# Patient Record
Sex: Female | Born: 1945 | Race: Black or African American | Hispanic: No | Marital: Married | Smoking: Former smoker
Health system: Southern US, Community
[De-identification: ages and names within clinical notes are randomized; demographics above are authoritative.]

## PROBLEM LIST (undated history)

## (undated) DIAGNOSIS — I469 Cardiac arrest, cause unspecified: Secondary | ICD-10-CM

## (undated) DIAGNOSIS — I639 Cerebral infarction, unspecified: Secondary | ICD-10-CM

## (undated) DIAGNOSIS — L299 Pruritus, unspecified: Secondary | ICD-10-CM

## (undated) DIAGNOSIS — G4730 Sleep apnea, unspecified: Secondary | ICD-10-CM

## (undated) DIAGNOSIS — I779 Disorder of arteries and arterioles, unspecified: Secondary | ICD-10-CM

## (undated) DIAGNOSIS — N186 End stage renal disease: Secondary | ICD-10-CM

## (undated) DIAGNOSIS — R911 Solitary pulmonary nodule: Secondary | ICD-10-CM

## (undated) DIAGNOSIS — R05 Cough: Secondary | ICD-10-CM

## (undated) DIAGNOSIS — I4891 Unspecified atrial fibrillation: Secondary | ICD-10-CM

## (undated) DIAGNOSIS — C3491 Malignant neoplasm of unspecified part of right bronchus or lung: Secondary | ICD-10-CM

## (undated) DIAGNOSIS — I2510 Atherosclerotic heart disease of native coronary artery without angina pectoris: Secondary | ICD-10-CM

## (undated) DIAGNOSIS — K2100 Gastro-esophageal reflux disease with esophagitis, without bleeding: Secondary | ICD-10-CM

## (undated) DIAGNOSIS — A048 Other specified bacterial intestinal infections: Secondary | ICD-10-CM

## (undated) DIAGNOSIS — K649 Unspecified hemorrhoids: Secondary | ICD-10-CM

## (undated) DIAGNOSIS — E119 Type 2 diabetes mellitus without complications: Secondary | ICD-10-CM

## (undated) DIAGNOSIS — D509 Iron deficiency anemia, unspecified: Secondary | ICD-10-CM

## (undated) DIAGNOSIS — K5730 Diverticulosis of large intestine without perforation or abscess without bleeding: Secondary | ICD-10-CM

## (undated) DIAGNOSIS — A491 Streptococcal infection, unspecified site: Secondary | ICD-10-CM

## (undated) DIAGNOSIS — I509 Heart failure, unspecified: Secondary | ICD-10-CM

## (undated) DIAGNOSIS — E785 Hyperlipidemia, unspecified: Secondary | ICD-10-CM

## (undated) DIAGNOSIS — Z9889 Other specified postprocedural states: Secondary | ICD-10-CM

## (undated) DIAGNOSIS — Z953 Presence of xenogenic heart valve: Secondary | ICD-10-CM

## (undated) DIAGNOSIS — H9190 Unspecified hearing loss, unspecified ear: Secondary | ICD-10-CM

## (undated) DIAGNOSIS — Z8679 Personal history of other diseases of the circulatory system: Secondary | ICD-10-CM

## (undated) DIAGNOSIS — I10 Essential (primary) hypertension: Secondary | ICD-10-CM

## (undated) DIAGNOSIS — I69920 Aphasia following unspecified cerebrovascular disease: Secondary | ICD-10-CM

## (undated) DIAGNOSIS — Z860100 Personal history of colon polyps, unspecified: Secondary | ICD-10-CM

## (undated) DIAGNOSIS — K219 Gastro-esophageal reflux disease without esophagitis: Secondary | ICD-10-CM

## (undated) DIAGNOSIS — I739 Peripheral vascular disease, unspecified: Secondary | ICD-10-CM

## (undated) DIAGNOSIS — I350 Nonrheumatic aortic (valve) stenosis: Secondary | ICD-10-CM

## (undated) DIAGNOSIS — I313 Pericardial effusion (noninflammatory): Secondary | ICD-10-CM

## (undated) DIAGNOSIS — I219 Acute myocardial infarction, unspecified: Secondary | ICD-10-CM

## (undated) DIAGNOSIS — I3139 Other pericardial effusion (noninflammatory): Secondary | ICD-10-CM

## (undated) HISTORY — DX: Essential (primary) hypertension: I10

## (undated) HISTORY — DX: Cough: R05

## (undated) HISTORY — DX: Iron deficiency anemia, unspecified: D50.9

## (undated) HISTORY — DX: Sleep apnea, unspecified: G47.30

## (undated) HISTORY — DX: Personal history of colonic polyps: Z86.010

## (undated) HISTORY — DX: Other specified bacterial intestinal infections: A04.8

## (undated) HISTORY — PX: TUBAL LIGATION: SHX77

## (undated) HISTORY — DX: Personal history of colon polyps, unspecified: Z86.0100

## (undated) HISTORY — DX: Unspecified hearing loss, unspecified ear: H91.90

## (undated) HISTORY — DX: Atherosclerotic heart disease of native coronary artery without angina pectoris: I25.10

## (undated) HISTORY — PX: CORONARY ANGIOPLASTY WITH STENT PLACEMENT: SHX49

## (undated) HISTORY — DX: Unspecified hemorrhoids: K64.9

## (undated) HISTORY — DX: Cerebral infarction, unspecified: I63.9

## (undated) HISTORY — DX: Diverticulosis of large intestine without perforation or abscess without bleeding: K57.30

## (undated) HISTORY — DX: Gastro-esophageal reflux disease with esophagitis, without bleeding: K21.00

## (undated) HISTORY — PX: TONSILLECTOMY: SUR1361

## (undated) HISTORY — DX: Heart failure, unspecified: I50.9

## (undated) HISTORY — DX: Hyperlipidemia, unspecified: E78.5

## (undated) HISTORY — DX: Gastro-esophageal reflux disease without esophagitis: K21.9

## (undated) HISTORY — PX: INSERTION OF DIALYSIS CATHETER: SHX1324

## (undated) HISTORY — DX: Gastro-esophageal reflux disease with esophagitis: K21.0

## (undated) HISTORY — DX: Acute myocardial infarction, unspecified: I21.9

## (undated) HISTORY — DX: Other pericardial effusion (noninflammatory): I31.39

## (undated) HISTORY — PX: NASAL RECONSTRUCTION WITH SEPTAL REPAIR: SHX5665

## (undated) HISTORY — DX: Solitary pulmonary nodule: R91.1

## (undated) HISTORY — DX: Streptococcal infection, unspecified site: A49.1

## (undated) HISTORY — DX: Pericardial effusion (noninflammatory): I31.3

## (undated) HISTORY — PX: TOTAL ABDOMINAL HYSTERECTOMY: SHX209

## (undated) HISTORY — PX: AV FISTULA REPAIR: SHX563

## (undated) HISTORY — PX: ARTERIOVENOUS GRAFT PLACEMENT: SUR1029

## (undated) HISTORY — DX: Malignant neoplasm of unspecified part of right bronchus or lung: C34.91

## (undated) HISTORY — DX: Aphasia following unspecified cerebrovascular disease: I69.920

## (undated) HISTORY — PX: AV FISTULA PLACEMENT: SHX1204

---
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
## 2001-07-19 DIAGNOSIS — I219 Acute myocardial infarction, unspecified: Secondary | ICD-10-CM

## 2001-07-19 HISTORY — DX: Acute myocardial infarction, unspecified: I21.9

## 2001-09-23 ENCOUNTER — Inpatient Hospital Stay (HOSPITAL_COMMUNITY): Admission: EM | Admit: 2001-09-23 | Discharge: 2001-09-25 | Payer: Self-pay | Admitting: *Deleted

## 2001-10-17 ENCOUNTER — Encounter (HOSPITAL_COMMUNITY): Admission: RE | Admit: 2001-10-17 | Discharge: 2002-01-15 | Payer: Self-pay | Admitting: Cardiology

## 2001-11-21 ENCOUNTER — Encounter: Admission: RE | Admit: 2001-11-21 | Discharge: 2001-11-21 | Payer: Self-pay | Admitting: Internal Medicine

## 2001-11-21 ENCOUNTER — Encounter: Payer: Self-pay | Admitting: Internal Medicine

## 2001-12-21 ENCOUNTER — Ambulatory Visit (HOSPITAL_BASED_OUTPATIENT_CLINIC_OR_DEPARTMENT_OTHER): Admission: RE | Admit: 2001-12-21 | Discharge: 2001-12-21 | Payer: Self-pay | Admitting: Critical Care Medicine

## 2002-04-06 ENCOUNTER — Ambulatory Visit (HOSPITAL_BASED_OUTPATIENT_CLINIC_OR_DEPARTMENT_OTHER): Admission: RE | Admit: 2002-04-06 | Discharge: 2002-04-06 | Payer: Self-pay | Admitting: Critical Care Medicine

## 2002-05-17 ENCOUNTER — Encounter: Admission: RE | Admit: 2002-05-17 | Discharge: 2002-08-15 | Payer: Self-pay | Admitting: *Deleted

## 2002-05-19 DIAGNOSIS — I2510 Atherosclerotic heart disease of native coronary artery without angina pectoris: Secondary | ICD-10-CM

## 2002-05-19 HISTORY — DX: Atherosclerotic heart disease of native coronary artery without angina pectoris: I25.10

## 2002-11-26 ENCOUNTER — Encounter: Payer: Self-pay | Admitting: Internal Medicine

## 2002-11-26 ENCOUNTER — Encounter: Admission: RE | Admit: 2002-11-26 | Discharge: 2002-11-26 | Payer: Self-pay | Admitting: Internal Medicine

## 2003-12-04 ENCOUNTER — Inpatient Hospital Stay (HOSPITAL_COMMUNITY): Admission: AD | Admit: 2003-12-04 | Discharge: 2003-12-06 | Payer: Self-pay | Admitting: Otolaryngology

## 2003-12-27 ENCOUNTER — Encounter: Admission: RE | Admit: 2003-12-27 | Discharge: 2003-12-27 | Payer: Self-pay | Admitting: Internal Medicine

## 2004-06-08 ENCOUNTER — Ambulatory Visit: Payer: Self-pay | Admitting: Internal Medicine

## 2004-07-16 ENCOUNTER — Ambulatory Visit: Payer: Self-pay | Admitting: Internal Medicine

## 2004-07-22 ENCOUNTER — Ambulatory Visit: Payer: Self-pay | Admitting: Cardiology

## 2004-08-20 ENCOUNTER — Ambulatory Visit (HOSPITAL_COMMUNITY): Admission: RE | Admit: 2004-08-20 | Discharge: 2004-08-20 | Payer: Self-pay | Admitting: Otolaryngology

## 2004-08-20 ENCOUNTER — Encounter (INDEPENDENT_AMBULATORY_CARE_PROVIDER_SITE_OTHER): Payer: Self-pay | Admitting: Specialist

## 2004-10-07 ENCOUNTER — Ambulatory Visit: Payer: Self-pay | Admitting: Internal Medicine

## 2004-10-12 ENCOUNTER — Ambulatory Visit: Payer: Self-pay | Admitting: Cardiology

## 2004-10-14 ENCOUNTER — Ambulatory Visit: Payer: Self-pay | Admitting: Internal Medicine

## 2004-11-11 ENCOUNTER — Ambulatory Visit: Payer: Self-pay | Admitting: Internal Medicine

## 2004-11-16 ENCOUNTER — Encounter: Admission: RE | Admit: 2004-11-16 | Discharge: 2005-02-14 | Payer: Self-pay | Admitting: Internal Medicine

## 2005-01-04 ENCOUNTER — Ambulatory Visit: Payer: Self-pay | Admitting: Internal Medicine

## 2005-01-12 ENCOUNTER — Encounter: Admission: RE | Admit: 2005-01-12 | Discharge: 2005-01-12 | Payer: Self-pay | Admitting: Internal Medicine

## 2005-01-26 ENCOUNTER — Ambulatory Visit: Payer: Self-pay | Admitting: Internal Medicine

## 2005-03-11 ENCOUNTER — Encounter: Admission: RE | Admit: 2005-03-11 | Discharge: 2005-06-09 | Payer: Self-pay | Admitting: Internal Medicine

## 2005-04-06 ENCOUNTER — Ambulatory Visit: Payer: Self-pay | Admitting: Internal Medicine

## 2005-06-14 ENCOUNTER — Ambulatory Visit: Payer: Self-pay | Admitting: Cardiology

## 2005-06-18 ENCOUNTER — Ambulatory Visit: Payer: Self-pay | Admitting: Internal Medicine

## 2005-06-21 ENCOUNTER — Ambulatory Visit: Payer: Self-pay | Admitting: Cardiology

## 2005-06-21 ENCOUNTER — Ambulatory Visit: Payer: Self-pay

## 2005-06-28 ENCOUNTER — Ambulatory Visit: Payer: Self-pay | Admitting: Cardiology

## 2005-07-01 ENCOUNTER — Ambulatory Visit: Payer: Self-pay | Admitting: Internal Medicine

## 2005-07-01 DIAGNOSIS — K5730 Diverticulosis of large intestine without perforation or abscess without bleeding: Secondary | ICD-10-CM | POA: Insufficient documentation

## 2005-07-06 ENCOUNTER — Ambulatory Visit: Payer: Self-pay | Admitting: Internal Medicine

## 2005-07-08 ENCOUNTER — Ambulatory Visit: Payer: Self-pay | Admitting: Cardiology

## 2005-07-20 ENCOUNTER — Ambulatory Visit: Payer: Self-pay | Admitting: Cardiology

## 2005-08-16 ENCOUNTER — Ambulatory Visit: Payer: Self-pay | Admitting: Cardiology

## 2005-10-01 ENCOUNTER — Emergency Department (HOSPITAL_COMMUNITY): Admission: EM | Admit: 2005-10-01 | Discharge: 2005-10-01 | Payer: Self-pay | Admitting: Family Medicine

## 2005-10-04 ENCOUNTER — Ambulatory Visit: Payer: Self-pay | Admitting: Internal Medicine

## 2005-12-28 ENCOUNTER — Ambulatory Visit: Payer: Self-pay | Admitting: *Deleted

## 2005-12-28 ENCOUNTER — Ambulatory Visit: Payer: Self-pay | Admitting: Cardiology

## 2006-01-05 ENCOUNTER — Ambulatory Visit: Payer: Self-pay | Admitting: Internal Medicine

## 2006-01-10 ENCOUNTER — Ambulatory Visit: Payer: Self-pay | Admitting: Internal Medicine

## 2006-01-18 ENCOUNTER — Encounter: Admission: RE | Admit: 2006-01-18 | Discharge: 2006-01-18 | Payer: Self-pay | Admitting: Internal Medicine

## 2006-02-07 ENCOUNTER — Ambulatory Visit: Payer: Self-pay | Admitting: Cardiology

## 2006-02-07 ENCOUNTER — Ambulatory Visit: Payer: Self-pay

## 2006-04-19 ENCOUNTER — Ambulatory Visit: Payer: Self-pay | Admitting: Internal Medicine

## 2006-06-13 ENCOUNTER — Ambulatory Visit: Payer: Self-pay | Admitting: Internal Medicine

## 2006-06-13 LAB — CONVERTED CEMR LAB
ALT: 19 units/L (ref 0–40)
AST: 22 units/L (ref 0–37)
Alkaline Phosphatase: 71 units/L (ref 39–117)
BUN: 26 mg/dL — ABNORMAL HIGH (ref 6–23)
Calcium: 9.3 mg/dL (ref 8.4–10.5)
Cholesterol: 219 mg/dL (ref 0–200)
GFR calc non Af Amer: 23 mL/min
Glucose, Bld: 126 mg/dL — ABNORMAL HIGH (ref 70–99)
Hemoglobin: 10.6 g/dL — ABNORMAL LOW (ref 12.0–15.0)
Hgb A1c MFr Bld: 6.4 % — ABNORMAL HIGH (ref 4.6–6.0)
MCHC: 32.3 g/dL (ref 30.0–36.0)
MCV: 79.6 fL (ref 78.0–100.0)
Mucus, UA: NEGATIVE
Potassium: 3.3 meq/L — ABNORMAL LOW (ref 3.5–5.1)
RBC: 4.12 M/uL (ref 3.87–5.11)
Total Protein, Urine: >=300 mg/dL — AB
Total Protein: 7.2 g/dL (ref 6.0–8.3)
VLDL: 24 mg/dL (ref 0–40)
WBC: 6.2 10*3/uL (ref 4.5–10.5)

## 2006-06-22 ENCOUNTER — Ambulatory Visit: Payer: Self-pay | Admitting: Cardiology

## 2006-06-24 ENCOUNTER — Ambulatory Visit: Payer: Self-pay | Admitting: Internal Medicine

## 2006-06-27 ENCOUNTER — Ambulatory Visit: Payer: Self-pay

## 2006-06-27 ENCOUNTER — Encounter: Payer: Self-pay | Admitting: Cardiology

## 2006-08-03 ENCOUNTER — Ambulatory Visit: Payer: Self-pay | Admitting: Internal Medicine

## 2006-09-05 ENCOUNTER — Ambulatory Visit: Payer: Self-pay | Admitting: Internal Medicine

## 2006-10-31 ENCOUNTER — Ambulatory Visit: Payer: Self-pay | Admitting: Internal Medicine

## 2006-10-31 LAB — CONVERTED CEMR LAB
BUN: 58 mg/dL — ABNORMAL HIGH (ref 6–23)
Basophils Relative: 3.1 % — ABNORMAL HIGH (ref 0.0–1.0)
Creatinine,U: 106.2 mg/dL
HDL: 49 mg/dL (ref >39.0)
Hemoglobin: 10.2 g/dL — ABNORMAL LOW (ref 12.0–15.0)
Iron: 69 ug/dL (ref 42–145)
LDL Cholesterol: 68 mg/dL (ref 0–99)
Microalb, Ur: 168.6 mg/dL (ref 0.0–1.9)
Monocytes Absolute: 0.5 10*3/uL (ref 0.2–0.7)
Monocytes Relative: 7 % (ref 3.0–11.0)
Potassium: 4.3 meq/L (ref 3.5–5.1)
RDW: 15.2 % — ABNORMAL HIGH (ref 11.5–14.6)
TSH: 3.21 microintl units/mL (ref 0.35–5.50)
Transferrin: 220.9 mg/dL (ref >=212.0)
VLDL: 28 mg/dL (ref 0–40)

## 2006-11-03 ENCOUNTER — Ambulatory Visit: Payer: Self-pay | Admitting: Internal Medicine

## 2006-12-28 ENCOUNTER — Ambulatory Visit: Payer: Self-pay | Admitting: Internal Medicine

## 2007-01-06 ENCOUNTER — Ambulatory Visit: Payer: Self-pay | Admitting: Internal Medicine

## 2007-01-06 LAB — CONVERTED CEMR LAB
ALT: 14 units/L (ref 0–40)
AST: 18 units/L (ref 0–37)
Albumin: 3.3 g/dL — ABNORMAL LOW (ref 3.5–5.2)
Basophils Relative: 0.2 % (ref 0.0–1.0)
Bilirubin Urine: NEGATIVE
Calcium, Total (PTH): 9.3 mg/dL (ref 8.4–10.5)
Calcium: 9.2 mg/dL (ref 8.4–10.5)
Chloride: 109 meq/L (ref 96–112)
Creatinine, Ser: 3.3 mg/dL — ABNORMAL HIGH (ref 0.4–1.2)
Crystals: NEGATIVE
Eosinophils Relative: 2.7 % (ref 0.0–5.0)
HCT: 26.6 % — ABNORMAL LOW (ref 36.0–46.0)
Hemoglobin: 8.7 g/dL — ABNORMAL LOW (ref 12.0–15.0)
Hgb A1c MFr Bld: 6.4 % — ABNORMAL HIGH (ref 4.6–6.0)
Monocytes Absolute: 0.7 10*3/uL (ref 0.2–0.7)
Neutrophils Relative %: 71.2 % (ref 43.0–77.0)
Nitrite: NEGATIVE
PTH: 672.5 pg/mL — ABNORMAL HIGH (ref 14.0–72.0)
RDW: 15.6 % — ABNORMAL HIGH (ref 11.5–14.6)
Sodium: 138 meq/L (ref 135–145)
Specific Gravity, Urine: 1.025 (ref 1.000–1.03)
TSH: 2.45 microintl units/mL (ref 0.35–5.50)
Total Bilirubin: 0.3 mg/dL (ref 0.3–1.2)
Total Protein, Urine: >=300 mg/dL — AB
Vit D, 1,25-Dihydroxy: 17 — ABNORMAL LOW (ref 20–57)
WBC: 7.3 10*3/uL (ref 4.5–10.5)

## 2007-01-11 ENCOUNTER — Ambulatory Visit: Payer: Self-pay | Admitting: Internal Medicine

## 2007-01-23 ENCOUNTER — Encounter: Admission: RE | Admit: 2007-01-23 | Discharge: 2007-01-23 | Payer: Self-pay | Admitting: Internal Medicine

## 2007-02-03 ENCOUNTER — Ambulatory Visit: Payer: Self-pay | Admitting: Internal Medicine

## 2007-02-03 LAB — CONVERTED CEMR LAB
BUN: 54 mg/dL — ABNORMAL HIGH (ref 6–23)
CO2: 25 meq/L (ref 19–32)
Calcium: 9.5 mg/dL (ref 8.4–10.5)
GFR calc Af Amer: 16 mL/min
GFR calc non Af Amer: 13 mL/min
Potassium: 4.3 meq/L (ref 3.5–5.1)

## 2007-02-16 ENCOUNTER — Encounter (HOSPITAL_COMMUNITY): Admission: RE | Admit: 2007-02-16 | Discharge: 2007-05-17 | Payer: Self-pay | Admitting: Nephrology

## 2007-02-24 ENCOUNTER — Ambulatory Visit: Payer: Self-pay | Admitting: Cardiology

## 2007-04-03 ENCOUNTER — Ambulatory Visit: Payer: Self-pay

## 2007-04-03 ENCOUNTER — Ambulatory Visit: Payer: Self-pay | Admitting: Cardiology

## 2007-04-03 ENCOUNTER — Encounter: Payer: Self-pay | Admitting: Cardiology

## 2007-04-03 LAB — CONVERTED CEMR LAB
ALT: 17 units/L (ref 0–35)
AST: 16 units/L (ref 0–37)
Bilirubin, Direct: 0.1 mg/dL (ref 0.0–0.3)
Cholesterol: 145 mg/dL (ref 0–200)
HDL: 38.5 mg/dL — ABNORMAL LOW (ref >39.0)
Total Bilirubin: 0.5 mg/dL (ref 0.3–1.2)
Total Protein: 6.7 g/dL (ref 6.0–8.3)
Triglycerides: 116 mg/dL (ref 0–149)

## 2007-04-10 ENCOUNTER — Ambulatory Visit: Payer: Self-pay | Admitting: Internal Medicine

## 2007-05-10 ENCOUNTER — Ambulatory Visit: Payer: Self-pay | Admitting: Internal Medicine

## 2007-05-12 ENCOUNTER — Ambulatory Visit (HOSPITAL_COMMUNITY): Admission: RE | Admit: 2007-05-12 | Discharge: 2007-05-12 | Payer: Self-pay | Admitting: Internal Medicine

## 2007-05-23 ENCOUNTER — Encounter: Payer: Self-pay | Admitting: Internal Medicine

## 2007-05-23 DIAGNOSIS — M109 Gout, unspecified: Secondary | ICD-10-CM | POA: Insufficient documentation

## 2007-05-23 DIAGNOSIS — Z940 Kidney transplant status: Secondary | ICD-10-CM

## 2007-05-23 DIAGNOSIS — E1122 Type 2 diabetes mellitus with diabetic chronic kidney disease: Secondary | ICD-10-CM | POA: Insufficient documentation

## 2007-05-23 DIAGNOSIS — Z860100 Personal history of colon polyps, unspecified: Secondary | ICD-10-CM | POA: Insufficient documentation

## 2007-05-23 DIAGNOSIS — E1129 Type 2 diabetes mellitus with other diabetic kidney complication: Secondary | ICD-10-CM

## 2007-05-23 DIAGNOSIS — I151 Hypertension secondary to other renal disorders: Secondary | ICD-10-CM

## 2007-05-23 DIAGNOSIS — K219 Gastro-esophageal reflux disease without esophagitis: Secondary | ICD-10-CM | POA: Insufficient documentation

## 2007-05-23 DIAGNOSIS — I509 Heart failure, unspecified: Secondary | ICD-10-CM | POA: Insufficient documentation

## 2007-05-23 DIAGNOSIS — I252 Old myocardial infarction: Secondary | ICD-10-CM

## 2007-05-23 DIAGNOSIS — I2510 Atherosclerotic heart disease of native coronary artery without angina pectoris: Secondary | ICD-10-CM

## 2007-06-06 ENCOUNTER — Ambulatory Visit: Payer: Self-pay | Admitting: Cardiology

## 2007-06-06 ENCOUNTER — Telehealth (INDEPENDENT_AMBULATORY_CARE_PROVIDER_SITE_OTHER): Payer: Self-pay | Admitting: *Deleted

## 2007-06-06 LAB — CONVERTED CEMR LAB
ALT: 24 units/L (ref 0–35)
Albumin: 3.5 g/dL (ref 3.5–5.2)
Alkaline Phosphatase: 55 units/L (ref 39–117)
Cholesterol: 142 mg/dL (ref 0–200)
Total Bilirubin: 0.6 mg/dL (ref 0.3–1.2)
Total CHOL/HDL Ratio: 2.8
Total Protein: 6.7 g/dL (ref 6.0–8.3)

## 2007-06-08 ENCOUNTER — Ambulatory Visit: Payer: Self-pay | Admitting: Internal Medicine

## 2007-06-08 DIAGNOSIS — Z8711 Personal history of peptic ulcer disease: Secondary | ICD-10-CM

## 2007-06-08 DIAGNOSIS — K210 Gastro-esophageal reflux disease with esophagitis: Secondary | ICD-10-CM

## 2007-06-08 DIAGNOSIS — K2980 Duodenitis without bleeding: Secondary | ICD-10-CM | POA: Insufficient documentation

## 2007-06-09 ENCOUNTER — Ambulatory Visit: Payer: Self-pay | Admitting: Internal Medicine

## 2007-06-09 DIAGNOSIS — K8020 Calculus of gallbladder without cholecystitis without obstruction: Secondary | ICD-10-CM | POA: Insufficient documentation

## 2007-06-12 ENCOUNTER — Encounter (HOSPITAL_COMMUNITY): Admission: RE | Admit: 2007-06-12 | Discharge: 2007-09-10 | Payer: Self-pay | Admitting: Nephrology

## 2007-06-12 LAB — CONVERTED CEMR LAB: Rh Type: POSITIVE

## 2007-06-29 ENCOUNTER — Encounter: Payer: Self-pay | Admitting: Internal Medicine

## 2007-07-05 ENCOUNTER — Ambulatory Visit: Payer: Self-pay | Admitting: Internal Medicine

## 2007-07-06 ENCOUNTER — Encounter (INDEPENDENT_AMBULATORY_CARE_PROVIDER_SITE_OTHER): Payer: Self-pay | Admitting: *Deleted

## 2007-07-20 DIAGNOSIS — I639 Cerebral infarction, unspecified: Secondary | ICD-10-CM

## 2007-07-20 HISTORY — DX: Cerebral infarction, unspecified: I63.9

## 2007-07-20 HISTORY — PX: CHOLECYSTECTOMY: SHX55

## 2007-08-16 ENCOUNTER — Ambulatory Visit: Payer: Self-pay | Admitting: Cardiology

## 2007-08-29 ENCOUNTER — Ambulatory Visit: Payer: Self-pay

## 2007-08-29 ENCOUNTER — Encounter: Payer: Self-pay | Admitting: Internal Medicine

## 2007-09-04 ENCOUNTER — Encounter (INDEPENDENT_AMBULATORY_CARE_PROVIDER_SITE_OTHER): Payer: Self-pay | Admitting: General Surgery

## 2007-09-04 ENCOUNTER — Ambulatory Visit (HOSPITAL_COMMUNITY): Admission: RE | Admit: 2007-09-04 | Discharge: 2007-09-05 | Payer: Self-pay | Admitting: General Surgery

## 2007-09-11 ENCOUNTER — Encounter (HOSPITAL_COMMUNITY): Admission: RE | Admit: 2007-09-11 | Discharge: 2007-12-10 | Payer: Self-pay | Admitting: Nephrology

## 2007-09-13 ENCOUNTER — Encounter: Payer: Self-pay | Admitting: Internal Medicine

## 2007-09-21 ENCOUNTER — Encounter: Payer: Self-pay | Admitting: Internal Medicine

## 2007-09-27 DIAGNOSIS — E785 Hyperlipidemia, unspecified: Secondary | ICD-10-CM

## 2007-09-27 DIAGNOSIS — K5900 Constipation, unspecified: Secondary | ICD-10-CM

## 2007-09-27 DIAGNOSIS — G4730 Sleep apnea, unspecified: Secondary | ICD-10-CM | POA: Insufficient documentation

## 2007-09-27 DIAGNOSIS — I6350 Cerebral infarction due to unspecified occlusion or stenosis of unspecified cerebral artery: Secondary | ICD-10-CM | POA: Insufficient documentation

## 2007-09-27 DIAGNOSIS — I319 Disease of pericardium, unspecified: Secondary | ICD-10-CM | POA: Insufficient documentation

## 2007-10-18 ENCOUNTER — Encounter: Payer: Self-pay | Admitting: Internal Medicine

## 2007-11-09 ENCOUNTER — Ambulatory Visit: Payer: Self-pay | Admitting: Internal Medicine

## 2007-11-10 LAB — CONVERTED CEMR LAB
Albumin: 3.7 g/dL (ref 3.5–5.2)
BUN: 70 mg/dL — ABNORMAL HIGH (ref 6–23)
Bilirubin, Direct: 0.1 mg/dL (ref 0.0–0.3)
Calcium: 10.6 mg/dL — ABNORMAL HIGH (ref 8.4–10.5)
GFR calc Af Amer: 13 mL/min
GFR calc non Af Amer: 11 mL/min
Glucose, Bld: 126 mg/dL — ABNORMAL HIGH (ref 70–99)
HDL: 48.5 mg/dL (ref >39.0)
LDL Cholesterol: 82 mg/dL (ref 0–99)
Potassium: 3.9 meq/L (ref 3.5–5.1)
Sodium: 141 meq/L (ref 135–145)
Total Protein: 7.1 g/dL (ref 6.0–8.3)
VLDL: 20 mg/dL (ref 0–40)

## 2007-11-13 ENCOUNTER — Ambulatory Visit: Payer: Self-pay | Admitting: Internal Medicine

## 2007-11-17 ENCOUNTER — Telehealth: Payer: Self-pay | Admitting: Internal Medicine

## 2007-12-27 ENCOUNTER — Encounter: Payer: Self-pay | Admitting: Internal Medicine

## 2008-01-01 ENCOUNTER — Encounter (HOSPITAL_COMMUNITY): Admission: RE | Admit: 2008-01-01 | Discharge: 2008-03-31 | Payer: Self-pay | Admitting: Nephrology

## 2008-02-07 ENCOUNTER — Ambulatory Visit: Payer: Self-pay | Admitting: Internal Medicine

## 2008-02-09 ENCOUNTER — Ambulatory Visit: Payer: Self-pay

## 2008-02-09 LAB — CONVERTED CEMR LAB
ALT: 22 units/L (ref 0–35)
AST: 23 units/L (ref 0–37)
Albumin: 3.6 g/dL (ref 3.5–5.2)
BUN: 48 mg/dL — ABNORMAL HIGH (ref 6–23)
Basophils Relative: 0.3 % (ref 0.0–3.0)
CO2: 26 meq/L (ref 19–32)
Chloride: 109 meq/L (ref 96–112)
Creatinine, Ser: 4.1 mg/dL — ABNORMAL HIGH (ref 0.4–1.2)
Eosinophils Relative: 3.6 % (ref 0.0–5.0)
Glucose, Bld: 147 mg/dL — ABNORMAL HIGH (ref 70–99)
Hemoglobin: 10.6 g/dL — ABNORMAL LOW (ref 12.0–15.0)
Hgb A1c MFr Bld: 6.2 % — ABNORMAL HIGH (ref 4.6–6.0)
Lymphocytes Relative: 14.9 % (ref 12.0–46.0)
Monocytes Relative: 8.5 % (ref 3.0–12.0)
Neutro Abs: 5.2 10*3/uL (ref 1.4–7.7)
Neutrophils Relative %: 72.7 % (ref 43.0–77.0)
RBC: 3.87 M/uL (ref 3.87–5.11)
Total Bilirubin: 0.5 mg/dL (ref 0.3–1.2)
Total Protein: 7 g/dL (ref 6.0–8.3)
WBC: 7.2 10*3/uL (ref 4.5–10.5)

## 2008-02-13 ENCOUNTER — Ambulatory Visit: Payer: Self-pay | Admitting: Internal Medicine

## 2008-02-19 ENCOUNTER — Encounter: Payer: Self-pay | Admitting: Internal Medicine

## 2008-02-19 ENCOUNTER — Ambulatory Visit: Payer: Self-pay | Admitting: Surgery

## 2008-02-27 ENCOUNTER — Encounter: Admission: RE | Admit: 2008-02-27 | Discharge: 2008-02-27 | Payer: Self-pay | Admitting: Internal Medicine

## 2008-02-28 ENCOUNTER — Ambulatory Visit (HOSPITAL_COMMUNITY): Admission: RE | Admit: 2008-02-28 | Discharge: 2008-02-28 | Payer: Self-pay | Admitting: Surgery

## 2008-02-28 ENCOUNTER — Ambulatory Visit: Payer: Self-pay | Admitting: Surgery

## 2008-03-18 ENCOUNTER — Ambulatory Visit: Payer: Self-pay | Admitting: Surgery

## 2008-03-19 ENCOUNTER — Encounter: Payer: Self-pay | Admitting: Internal Medicine

## 2008-03-20 ENCOUNTER — Ambulatory Visit (HOSPITAL_COMMUNITY): Admission: RE | Admit: 2008-03-20 | Discharge: 2008-03-20 | Payer: Self-pay | Admitting: Surgery

## 2008-03-29 ENCOUNTER — Ambulatory Visit (HOSPITAL_COMMUNITY): Admission: RE | Admit: 2008-03-29 | Discharge: 2008-03-29 | Payer: Self-pay | Admitting: Surgery

## 2008-03-29 ENCOUNTER — Ambulatory Visit: Payer: Self-pay | Admitting: Surgery

## 2008-04-29 ENCOUNTER — Ambulatory Visit: Payer: Self-pay | Admitting: Cardiology

## 2008-05-04 ENCOUNTER — Inpatient Hospital Stay (HOSPITAL_COMMUNITY): Admission: EM | Admit: 2008-05-04 | Discharge: 2008-05-09 | Payer: Self-pay | Admitting: Emergency Medicine

## 2008-05-04 ENCOUNTER — Ambulatory Visit: Payer: Self-pay | Admitting: Internal Medicine

## 2008-05-06 ENCOUNTER — Encounter: Payer: Self-pay | Admitting: Internal Medicine

## 2008-05-07 ENCOUNTER — Ambulatory Visit: Payer: Self-pay | Admitting: Vascular Surgery

## 2008-05-21 ENCOUNTER — Ambulatory Visit: Payer: Self-pay | Admitting: Internal Medicine

## 2008-05-21 ENCOUNTER — Ambulatory Visit: Payer: Self-pay | Admitting: Vascular Surgery

## 2008-05-21 DIAGNOSIS — R5382 Chronic fatigue, unspecified: Secondary | ICD-10-CM | POA: Insufficient documentation

## 2008-06-13 ENCOUNTER — Ambulatory Visit: Payer: Self-pay | Admitting: Cardiovascular Disease

## 2008-06-14 ENCOUNTER — Ambulatory Visit: Payer: Self-pay

## 2008-06-14 ENCOUNTER — Encounter: Payer: Self-pay | Admitting: Cardiology

## 2008-06-17 ENCOUNTER — Telehealth: Payer: Self-pay | Admitting: Internal Medicine

## 2008-06-19 ENCOUNTER — Ambulatory Visit: Payer: Self-pay | Admitting: Internal Medicine

## 2008-06-19 ENCOUNTER — Ambulatory Visit: Payer: Self-pay | Admitting: Cardiology

## 2008-06-19 ENCOUNTER — Inpatient Hospital Stay (HOSPITAL_COMMUNITY): Admission: EM | Admit: 2008-06-19 | Discharge: 2008-06-28 | Payer: Self-pay | Admitting: Emergency Medicine

## 2008-06-20 ENCOUNTER — Encounter (INDEPENDENT_AMBULATORY_CARE_PROVIDER_SITE_OTHER): Payer: Self-pay | Admitting: Internal Medicine

## 2008-06-21 ENCOUNTER — Encounter: Payer: Self-pay | Admitting: Internal Medicine

## 2008-06-26 ENCOUNTER — Ambulatory Visit: Payer: Self-pay | Admitting: Infectious Diseases

## 2008-06-28 ENCOUNTER — Encounter: Payer: Self-pay | Admitting: Internal Medicine

## 2008-06-28 ENCOUNTER — Telehealth: Payer: Self-pay | Admitting: Internal Medicine

## 2008-07-02 ENCOUNTER — Ambulatory Visit: Payer: Self-pay | Admitting: Internal Medicine

## 2008-07-02 DIAGNOSIS — I69920 Aphasia following unspecified cerebrovascular disease: Secondary | ICD-10-CM | POA: Insufficient documentation

## 2008-07-02 DIAGNOSIS — C649 Malignant neoplasm of unspecified kidney, except renal pelvis: Secondary | ICD-10-CM | POA: Insufficient documentation

## 2008-07-09 ENCOUNTER — Encounter: Payer: Self-pay | Admitting: Internal Medicine

## 2008-07-18 ENCOUNTER — Encounter: Admission: RE | Admit: 2008-07-18 | Discharge: 2008-09-05 | Payer: Self-pay | Admitting: Internal Medicine

## 2008-07-19 HISTORY — PX: NEPHRECTOMY: SHX65

## 2008-07-25 ENCOUNTER — Encounter: Payer: Self-pay | Admitting: Internal Medicine

## 2008-08-01 ENCOUNTER — Ambulatory Visit: Payer: Self-pay | Admitting: *Deleted

## 2008-08-27 ENCOUNTER — Ambulatory Visit: Payer: Self-pay | Admitting: Internal Medicine

## 2008-08-27 DIAGNOSIS — R05 Cough: Secondary | ICD-10-CM

## 2008-08-27 DIAGNOSIS — R109 Unspecified abdominal pain: Secondary | ICD-10-CM | POA: Insufficient documentation

## 2008-08-29 ENCOUNTER — Ambulatory Visit: Payer: Self-pay | Admitting: *Deleted

## 2008-09-10 ENCOUNTER — Ambulatory Visit: Payer: Self-pay | Admitting: Cardiovascular Disease

## 2008-09-17 ENCOUNTER — Ambulatory Visit: Payer: Self-pay | Admitting: *Deleted

## 2008-09-17 ENCOUNTER — Ambulatory Visit (HOSPITAL_COMMUNITY): Admission: RE | Admit: 2008-09-17 | Discharge: 2008-09-17 | Payer: Self-pay | Admitting: *Deleted

## 2008-10-03 ENCOUNTER — Ambulatory Visit: Payer: Self-pay | Admitting: Internal Medicine

## 2008-10-29 ENCOUNTER — Ambulatory Visit: Payer: Self-pay | Admitting: Internal Medicine

## 2008-10-29 DIAGNOSIS — R238 Other skin changes: Secondary | ICD-10-CM | POA: Insufficient documentation

## 2008-11-13 ENCOUNTER — Encounter: Payer: Self-pay | Admitting: Internal Medicine

## 2008-11-14 ENCOUNTER — Ambulatory Visit (HOSPITAL_COMMUNITY): Admission: RE | Admit: 2008-11-14 | Discharge: 2008-11-14 | Payer: Self-pay | Admitting: Vascular Surgery

## 2008-11-14 ENCOUNTER — Ambulatory Visit: Payer: Self-pay | Admitting: Surgery

## 2008-11-15 ENCOUNTER — Ambulatory Visit: Payer: Self-pay | Admitting: Endocrinology

## 2008-11-20 ENCOUNTER — Encounter: Payer: Self-pay | Admitting: Internal Medicine

## 2008-11-21 ENCOUNTER — Ambulatory Visit (HOSPITAL_COMMUNITY): Admission: RE | Admit: 2008-11-21 | Discharge: 2008-11-21 | Payer: Self-pay | Admitting: Nephrology

## 2008-12-02 ENCOUNTER — Encounter (INDEPENDENT_AMBULATORY_CARE_PROVIDER_SITE_OTHER): Payer: Self-pay | Admitting: *Deleted

## 2008-12-19 ENCOUNTER — Ambulatory Visit: Payer: Self-pay | Admitting: Internal Medicine

## 2008-12-30 ENCOUNTER — Telehealth: Payer: Self-pay | Admitting: Internal Medicine

## 2008-12-31 DIAGNOSIS — I480 Paroxysmal atrial fibrillation: Secondary | ICD-10-CM | POA: Insufficient documentation

## 2008-12-31 DIAGNOSIS — I4891 Unspecified atrial fibrillation: Secondary | ICD-10-CM

## 2008-12-31 DIAGNOSIS — D649 Anemia, unspecified: Secondary | ICD-10-CM

## 2008-12-31 DIAGNOSIS — B952 Enterococcus as the cause of diseases classified elsewhere: Secondary | ICD-10-CM

## 2008-12-31 HISTORY — DX: Unspecified atrial fibrillation: I48.91

## 2009-01-02 ENCOUNTER — Encounter: Payer: Self-pay | Admitting: Nurse Practitioner

## 2009-01-02 ENCOUNTER — Ambulatory Visit: Payer: Self-pay | Admitting: Internal Medicine

## 2009-01-14 ENCOUNTER — Telehealth: Payer: Self-pay | Admitting: Internal Medicine

## 2009-01-14 ENCOUNTER — Ambulatory Visit: Payer: Self-pay | Admitting: Internal Medicine

## 2009-01-23 ENCOUNTER — Encounter: Payer: Self-pay | Admitting: Internal Medicine

## 2009-01-24 ENCOUNTER — Telehealth: Payer: Self-pay | Admitting: Internal Medicine

## 2009-03-04 ENCOUNTER — Ambulatory Visit: Payer: Self-pay | Admitting: Cardiology

## 2009-03-04 DIAGNOSIS — I6529 Occlusion and stenosis of unspecified carotid artery: Secondary | ICD-10-CM

## 2009-03-04 DIAGNOSIS — L299 Pruritus, unspecified: Secondary | ICD-10-CM | POA: Insufficient documentation

## 2009-03-11 ENCOUNTER — Encounter: Admission: RE | Admit: 2009-03-11 | Discharge: 2009-03-11 | Payer: Self-pay | Admitting: Internal Medicine

## 2009-03-18 ENCOUNTER — Encounter: Payer: Self-pay | Admitting: Internal Medicine

## 2009-03-20 ENCOUNTER — Encounter: Admission: RE | Admit: 2009-03-20 | Discharge: 2009-03-20 | Payer: Self-pay | Admitting: Internal Medicine

## 2009-03-25 ENCOUNTER — Ambulatory Visit: Payer: Self-pay | Admitting: Internal Medicine

## 2009-04-29 ENCOUNTER — Encounter: Payer: Self-pay | Admitting: Internal Medicine

## 2009-05-13 ENCOUNTER — Encounter: Payer: Self-pay | Admitting: Internal Medicine

## 2009-05-15 ENCOUNTER — Encounter: Payer: Self-pay | Admitting: Internal Medicine

## 2009-05-27 ENCOUNTER — Encounter: Payer: Self-pay | Admitting: Internal Medicine

## 2009-06-03 ENCOUNTER — Ambulatory Visit (HOSPITAL_COMMUNITY): Admission: RE | Admit: 2009-06-03 | Discharge: 2009-06-03 | Payer: Self-pay | Admitting: Nephrology

## 2009-06-05 ENCOUNTER — Encounter (INDEPENDENT_AMBULATORY_CARE_PROVIDER_SITE_OTHER): Payer: Self-pay | Admitting: *Deleted

## 2009-06-19 ENCOUNTER — Ambulatory Visit: Payer: Self-pay | Admitting: Internal Medicine

## 2009-06-24 ENCOUNTER — Ambulatory Visit: Payer: Self-pay | Admitting: Internal Medicine

## 2009-06-24 LAB — CONVERTED CEMR LAB
Bilirubin, Direct: 0 mg/dL (ref 0.0–0.3)
CO2: 31 meq/L (ref 19–32)
Calcium: 10.1 mg/dL (ref 8.4–10.5)
Creatinine, Ser: 5.8 mg/dL (ref 0.4–1.2)
GFR calc non Af Amer: 9.4 mL/min (ref >60)
HDL: 61.2 mg/dL (ref >39.00)
Total CHOL/HDL Ratio: 2
Total Protein: 7.6 g/dL (ref 6.0–8.3)
Triglycerides: 85 mg/dL (ref 0.0–149.0)
VLDL: 17 mg/dL (ref 0.0–40.0)

## 2009-06-26 ENCOUNTER — Encounter: Payer: Self-pay | Admitting: Internal Medicine

## 2009-07-15 ENCOUNTER — Encounter: Payer: Self-pay | Admitting: Internal Medicine

## 2009-08-28 ENCOUNTER — Encounter: Admission: RE | Admit: 2009-08-28 | Discharge: 2009-08-28 | Payer: Self-pay | Admitting: Nephrology

## 2009-09-02 ENCOUNTER — Encounter: Payer: Self-pay | Admitting: Internal Medicine

## 2009-09-18 ENCOUNTER — Encounter: Admission: RE | Admit: 2009-09-18 | Discharge: 2009-09-18 | Payer: Self-pay | Admitting: Internal Medicine

## 2009-10-02 ENCOUNTER — Ambulatory Visit: Payer: Self-pay | Admitting: Internal Medicine

## 2009-10-02 LAB — CONVERTED CEMR LAB
Alkaline Phosphatase: 72 units/L (ref 39–117)
BUN: 32 mg/dL — ABNORMAL HIGH (ref 6–23)
Basophils Absolute: 0 10*3/uL (ref 0.0–0.1)
Bilirubin, Direct: 0.1 mg/dL (ref 0.0–0.3)
CO2: 31 meq/L (ref 19–32)
Chloride: 105 meq/L (ref 96–112)
Cholesterol: 120 mg/dL (ref 0–200)
Creatinine, Ser: 5.1 mg/dL (ref 0.4–1.2)
Eosinophils Absolute: 0.1 10*3/uL (ref 0.0–0.7)
HCT: 33.7 % — ABNORMAL LOW (ref 36.0–46.0)
Hemoglobin: 10.8 g/dL — ABNORMAL LOW (ref 12.0–15.0)
LDL Cholesterol: 32 mg/dL (ref 0–99)
Lymphs Abs: 1.5 10*3/uL (ref 0.7–4.0)
MCHC: 32.1 g/dL (ref 30.0–36.0)
MCV: 93.3 fL (ref 78.0–100.0)
Neutro Abs: 3.6 10*3/uL (ref 1.4–7.7)
RDW: 20.4 % — ABNORMAL HIGH (ref 11.5–14.6)
Total CHOL/HDL Ratio: 2

## 2009-10-07 ENCOUNTER — Ambulatory Visit: Payer: Self-pay | Admitting: Internal Medicine

## 2009-10-07 DIAGNOSIS — F17200 Nicotine dependence, unspecified, uncomplicated: Secondary | ICD-10-CM | POA: Insufficient documentation

## 2009-10-07 DIAGNOSIS — L659 Nonscarring hair loss, unspecified: Secondary | ICD-10-CM | POA: Insufficient documentation

## 2009-10-07 DIAGNOSIS — R21 Rash and other nonspecific skin eruption: Secondary | ICD-10-CM | POA: Insufficient documentation

## 2009-10-14 ENCOUNTER — Telehealth (INDEPENDENT_AMBULATORY_CARE_PROVIDER_SITE_OTHER): Payer: Self-pay | Admitting: *Deleted

## 2009-10-14 ENCOUNTER — Ambulatory Visit: Payer: Self-pay | Admitting: Cardiology

## 2009-10-21 ENCOUNTER — Encounter: Payer: Self-pay | Admitting: Internal Medicine

## 2009-11-04 ENCOUNTER — Telehealth (INDEPENDENT_AMBULATORY_CARE_PROVIDER_SITE_OTHER): Payer: Self-pay | Admitting: *Deleted

## 2009-11-11 ENCOUNTER — Telehealth: Payer: Self-pay | Admitting: Internal Medicine

## 2009-11-20 ENCOUNTER — Encounter: Payer: Self-pay | Admitting: Internal Medicine

## 2009-11-25 ENCOUNTER — Encounter: Payer: Self-pay | Admitting: Internal Medicine

## 2009-11-27 ENCOUNTER — Encounter: Payer: Self-pay | Admitting: Internal Medicine

## 2009-11-27 ENCOUNTER — Encounter: Payer: Self-pay | Admitting: Physician Assistant

## 2009-12-01 ENCOUNTER — Telehealth: Payer: Self-pay | Admitting: Internal Medicine

## 2009-12-03 ENCOUNTER — Encounter: Payer: Self-pay | Admitting: Internal Medicine

## 2009-12-09 ENCOUNTER — Ambulatory Visit: Payer: Self-pay | Admitting: Internal Medicine

## 2009-12-09 DIAGNOSIS — N186 End stage renal disease: Secondary | ICD-10-CM | POA: Insufficient documentation

## 2009-12-30 ENCOUNTER — Telehealth: Payer: Self-pay | Admitting: Internal Medicine

## 2010-01-29 ENCOUNTER — Encounter: Payer: Self-pay | Admitting: Internal Medicine

## 2010-02-03 ENCOUNTER — Telehealth: Payer: Self-pay | Admitting: Internal Medicine

## 2010-02-16 ENCOUNTER — Encounter: Payer: Self-pay | Admitting: Cardiology

## 2010-02-17 ENCOUNTER — Encounter: Payer: Self-pay | Admitting: Cardiovascular Disease

## 2010-02-17 ENCOUNTER — Ambulatory Visit: Payer: Self-pay

## 2010-02-17 ENCOUNTER — Encounter: Payer: Self-pay | Admitting: Cardiology

## 2010-02-24 ENCOUNTER — Encounter: Payer: Self-pay | Admitting: Physician Assistant

## 2010-03-10 ENCOUNTER — Ambulatory Visit: Payer: Self-pay | Admitting: Internal Medicine

## 2010-03-10 DIAGNOSIS — R1314 Dysphagia, pharyngoesophageal phase: Secondary | ICD-10-CM | POA: Insufficient documentation

## 2010-03-12 ENCOUNTER — Encounter: Admission: RE | Admit: 2010-03-12 | Discharge: 2010-03-12 | Payer: Self-pay | Admitting: Otolaryngology

## 2010-03-24 ENCOUNTER — Encounter: Admission: RE | Admit: 2010-03-24 | Discharge: 2010-03-24 | Payer: Self-pay | Admitting: Internal Medicine

## 2010-03-24 LAB — HM MAMMOGRAPHY: HM Mammogram: NEGATIVE

## 2010-04-09 ENCOUNTER — Telehealth: Payer: Self-pay | Admitting: Internal Medicine

## 2010-04-14 ENCOUNTER — Ambulatory Visit: Payer: Self-pay | Admitting: Gastroenterology

## 2010-04-14 DIAGNOSIS — R110 Nausea: Secondary | ICD-10-CM

## 2010-04-16 ENCOUNTER — Telehealth (INDEPENDENT_AMBULATORY_CARE_PROVIDER_SITE_OTHER): Payer: Self-pay | Admitting: *Deleted

## 2010-04-23 ENCOUNTER — Ambulatory Visit (HOSPITAL_COMMUNITY): Admission: RE | Admit: 2010-04-23 | Discharge: 2010-04-23 | Payer: Self-pay | Admitting: Gastroenterology

## 2010-04-28 ENCOUNTER — Ambulatory Visit: Payer: Self-pay | Admitting: Cardiology

## 2010-05-05 ENCOUNTER — Encounter: Payer: Self-pay | Admitting: Internal Medicine

## 2010-05-05 ENCOUNTER — Telehealth: Payer: Self-pay | Admitting: Cardiology

## 2010-05-05 ENCOUNTER — Telehealth: Payer: Self-pay | Admitting: Internal Medicine

## 2010-05-07 ENCOUNTER — Encounter: Payer: Self-pay | Admitting: Cardiology

## 2010-05-07 ENCOUNTER — Ambulatory Visit: Payer: Self-pay

## 2010-05-07 ENCOUNTER — Ambulatory Visit: Payer: Self-pay | Admitting: Internal Medicine

## 2010-05-07 ENCOUNTER — Ambulatory Visit (HOSPITAL_COMMUNITY): Admission: RE | Admit: 2010-05-07 | Discharge: 2010-05-07 | Payer: Self-pay | Admitting: Cardiology

## 2010-05-19 ENCOUNTER — Encounter: Payer: Self-pay | Admitting: Internal Medicine

## 2010-05-20 ENCOUNTER — Telehealth (INDEPENDENT_AMBULATORY_CARE_PROVIDER_SITE_OTHER): Payer: Self-pay | Admitting: *Deleted

## 2010-05-20 ENCOUNTER — Encounter: Payer: Self-pay | Admitting: Internal Medicine

## 2010-05-27 ENCOUNTER — Ambulatory Visit: Payer: Self-pay | Admitting: Internal Medicine

## 2010-05-27 ENCOUNTER — Telehealth: Payer: Self-pay | Admitting: Internal Medicine

## 2010-05-27 DIAGNOSIS — D689 Coagulation defect, unspecified: Secondary | ICD-10-CM

## 2010-06-04 ENCOUNTER — Telehealth: Payer: Self-pay | Admitting: Internal Medicine

## 2010-06-05 ENCOUNTER — Ambulatory Visit: Payer: Self-pay | Admitting: Internal Medicine

## 2010-06-09 ENCOUNTER — Ambulatory Visit: Payer: Self-pay | Admitting: Internal Medicine

## 2010-06-09 LAB — HM COLONOSCOPY

## 2010-06-09 LAB — CONVERTED CEMR LAB
INR: 1.6 — ABNORMAL HIGH
Prothrombin Time: 16.6 s — ABNORMAL HIGH

## 2010-06-14 ENCOUNTER — Encounter: Payer: Self-pay | Admitting: Internal Medicine

## 2010-06-16 ENCOUNTER — Ambulatory Visit: Payer: Self-pay | Admitting: Internal Medicine

## 2010-06-16 ENCOUNTER — Telehealth: Payer: Self-pay | Admitting: Internal Medicine

## 2010-06-16 ENCOUNTER — Ambulatory Visit: Payer: Self-pay | Admitting: Cardiology

## 2010-06-30 ENCOUNTER — Encounter: Payer: Self-pay | Admitting: Internal Medicine

## 2010-06-30 ENCOUNTER — Encounter: Payer: Self-pay | Admitting: Cardiology

## 2010-07-16 ENCOUNTER — Telehealth: Payer: Self-pay | Admitting: Internal Medicine

## 2010-07-28 ENCOUNTER — Ambulatory Visit
Admission: RE | Admit: 2010-07-28 | Discharge: 2010-07-28 | Payer: Self-pay | Source: Home / Self Care | Attending: Internal Medicine | Admitting: Internal Medicine

## 2010-07-28 DIAGNOSIS — K117 Disturbances of salivary secretion: Secondary | ICD-10-CM | POA: Insufficient documentation

## 2010-08-06 ENCOUNTER — Encounter: Payer: Self-pay | Admitting: Cardiology

## 2010-08-06 ENCOUNTER — Encounter: Payer: Self-pay | Admitting: Internal Medicine

## 2010-08-10 ENCOUNTER — Encounter: Payer: Self-pay | Admitting: Internal Medicine

## 2010-08-11 ENCOUNTER — Telehealth: Payer: Self-pay | Admitting: Internal Medicine

## 2010-08-11 ENCOUNTER — Ambulatory Visit
Admission: RE | Admit: 2010-08-11 | Discharge: 2010-08-11 | Payer: Self-pay | Source: Home / Self Care | Attending: Internal Medicine | Admitting: Internal Medicine

## 2010-08-13 ENCOUNTER — Encounter: Payer: Self-pay | Admitting: Internal Medicine

## 2010-08-13 ENCOUNTER — Encounter: Payer: Self-pay | Admitting: Cardiology

## 2010-08-18 NOTE — Assessment & Plan Note (Signed)
Summary: 3 MO ROV /NWS  #   Vital Signs:  Patient profile:   65 year old female Weight:      161 pounds Temp:     97.8 degrees F oral Pulse rate:   69 / minute BP sitting:   152 / 62  (left arm)  Vitals Entered By: Doralee Albino (October 07, 2009 3:26 PM) CC: f/u   Primary Care Provider:  Walker Kehr, MD  CC:  f/u.  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 2 Tabs Every Day Except Mon Wens Fri 1 Tab Once Daily 2)  Colchicine 0.6 Mg  Tabs (Colchicine) .... Take 1 By Mouth Two Times A Day Q As Needed 3)  Amlodipine Besylate 10 Mg  Tabs (Amlodipine Besylate) .... Take 1 By Mouth Qd 4)  Pravachol 40 Mg Tabs (Pravastatin Sodium) .Marland Kitchen.. 1 By Mouth Qd 5)  Coumadin 5 Mg Tabs (Warfarin Sodium) .... Tues and Thurs 1 Tab The Rest of The Week 1 1/2 Tab Once Daily 6)  Nitrolingual Duo Pack 0.4 Mg/spray Soln (Nitroglycerin) .... Use Sl As Needed As Dirr 7)  Diovan 160 Mg Tabs (Valsartan) .... Take 1 Tab in Am and 1 in Pm 8)  Miralax   Powd (Polyethylene Glycol 3350) .Marland Kitchen.. 17g By Mouth Once Daily As Needed Constipation 9)  Epogen 4000 Unit/ml Soln (Epoetin Alfa) .... As Directed Dialysis 10)  Renvela 800 Mg Tabs (Sevelamer Carbonate) .... Take 1 Tablet By Mouth Three Times A Day 11)  Vitamin C Cr 500 Mg Cr-Caps (Ascorbic Acid) .Marland Kitchen.. 1 Po Qd 12)  Triamcinolone Acetonide 0.5 % Crea (Triamcinolone Acetonide) .... As Needed 13)  Lidocaine-Prilocaine 2.5-2.5 % Crea (Lidocaine-Prilocaine) .Marland Kitchen.. 1-2 Hours Before Procedure 14)  Renal  Tabs (Multiple Vitamins-Minerals) .... Once Daily 15)  Onetouch Ultrasoft Lancets  Misc (Lancets) .... Test Two Times A Day As Needed 16)  Onetouch Ultra Test  Strp (Glucose Blood) .... Test Two Times  A Day As Needed 17)  Sensipar 30 Mg Tabs (Cinacalcet Hcl) .Marland Kitchen.. 1 Tab By Mouth Once Daily 18)  Keppra 250 Mg Tabs (Levetiracetam) .Marland Kitchen.. 1 By Mouth Bid 19)  Zyrtec Hives Relief 10 Mg Tabs (Cetirizine Hcl) .... Two Times A Day 20)  Protonix 40 Mg Tbec (Pantoprazole Sodium) .... Once Daily 21)  Sustenex-Probiotic .... Once Daily 22)  Hydroxyzine Hcl 25 Mg Tabs (Hydroxyzine Hcl) .Marland Kitchen.. 1-2 Tabs By Mouth Two Times A Day As Needed Itching  Allergies: 1)  ! Sulfa 2)  ! Bactrim Ds (Sulfamethoxazole-Trimethoprim)  Past History:  Past Surgical History: Last updated: 01/14/2009 PCI of Rt Coronary Artery Cholecystectomy 2009  She has had removal of a left AV graft.  She has had   2 caths.  She had a right AV fistula.  She has had ,   nasal septal surgery, tonsillectomy, colonoscopy.  Hysterectomy Oophorectomy Tubal ligation Nephrectomy 2010 no Ca on bx     Social History: Last updated: 01/14/2009 Retired Married x 50 y 2010 Patient is a former smoker.  Alcohol Use - no Illicit Drug Use - no Daily Caffeine Use 1 cup coffee  Past Medical History: CORONARY ARTERY DISEASE (ICD-414.00)      a.  S/P Acute Inf. MI 05/2002, with  PCI/Express BMS to RCA.  OTW nonobs dzs and NL EF.      b.  11/2003 - cath - Nonobs dzs.      c..  08/2007 - Adenosine MV: EF 49% (visually appeared better); Breast attenuation w/o ischemia. PAROXYSMAL ATRIAL FIBRILLATION (ICD-427.31) MYOCARDIAL INFARCTION, HX OF (ICD-412) HYPERTENSION (ICD-401.9) HYPERLIPIDEMIA (ICD-272.4) DIASTOLIC CONGESTIVE HEART FAILURE      a.  06/2008, Echo:  ef 60-70%. 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  Teeth in good repair. Lungs:  Clear bilaterally to auscultation and percussion. Heart:  irreg, irreg s1/s2 no s3/s4/murmurs. Abdomen:  round, soft, nt/nd/bs+x4. Msk:  Lumbar-sacral spine is tender to palpation over paraspinal muscles and painfull with the ROM  Neurologic:  Alert and oriented x 3. Skin:  Nodules and papules scattered Alopecia on L scalp Psych:  Normal affect.   Impression & Recommendations:  Problem # 1:  SKIN RASH (ICD-782.1) Assessment Deteriorated  Her updated medication list for this problem includes:    Triamcinolone Acetonide 0.5 % Crea (Triamcinolone acetonide) .Marland Kitchen... As needed    Ketoconazole 2 % Crea (Ketoconazole) ..... Use two times a day on scalp Take 40mg  qd for 3 days, then 20 mg qd for 3 days, then 10mg  qd for 6 days, then stop. Take pc.   Problem # 2:  PRURITUS (ICD-698.9) Hydroxyzine  Problem # 3:  ALOPECIA (ICD-704.00) Assessment: New F/u w/Dr Ronnald Ramp  Problem # 4:  CORONARY ARTERY DISEASE (ICD-414.00) Assessment: Unchanged  Her updated medication list for this problem includes:    Labetalol Hcl 100 Mg  Tabs (Labetalol hcl) .Marland Kitchen... 2 tabs every day except mon wens fri 1 tab once daily    Amlodipine Besylate 10 Mg Tabs (Amlodipine besylate) .Marland Kitchen... Take 1 by mouth qd    Nitrolingual Duo Pack 0.4 Mg/spray Soln (Nitroglycerin) ..... Use sl as needed as dirr    Diovan 160 Mg Tabs (Valsartan) .Marland Kitchen... Take 1 tab in am and 1 in pm  Complete Medication List: 1)  Labetalol Hcl 100 Mg Tabs (Labetalol hcl) .... 2 tabs every day except mon wens fri 1 tab once daily 2)  Colchicine 0.6 Mg Tabs (Colchicine) .... Take 1 by mouth two times a day q as needed 3)  Amlodipine Besylate 10 Mg Tabs (Amlodipine besylate) .... Take 1 by mouth qd 4)  Pravachol 40 Mg Tabs (Pravastatin sodium) .Marland Kitchen.. 1 by mouth qd 5)  Coumadin 5 Mg Tabs (Warfarin sodium) .... Tues and thurs 1 tab the rest of the week 1 1/2 tab once daily 6)  Nitrolingual Duo Pack 0.4 Mg/spray Soln (Nitroglycerin) .... Use sl as needed as dirr 7)  Diovan 160 Mg Tabs (Valsartan) .Marland KitchenMarland KitchenMarland Kitchen  Take 1 tab in am and 1 in pm 8)  Miralax Powd (Polyethylene glycol 3350) .Marland Kitchen.. 17g by mouth once daily as needed constipation 9)  Epogen 4000 Unit/ml Soln (Epoetin alfa) .... As directed dialysis 10)  Renvela 800 Mg Tabs (Sevelamer carbonate) .... Take 1 tablet by mouth three times a day 11)  Vitamin C Cr 500 Mg Cr-caps (Ascorbic acid) .Marland Kitchen.. 1 po qd 12)  Triamcinolone Acetonide 0.5 % Crea (Triamcinolone acetonide) .... As needed 13)  Lidocaine-prilocaine 2.5-2.5 % Crea (Lidocaine-prilocaine) .Marland Kitchen.. 1-2 hours before procedure 14)  Renal Tabs (Multiple vitamins-minerals) .... Once daily 15)  Onetouch Ultrasoft Lancets Misc (Lancets) .... Test two times a day as needed 16)  Onetouch Ultra Test Strp (Glucose blood) .... Test two times a day as needed 17)  Sensipar 30 Mg Tabs (Cinacalcet hcl) .Marland Kitchen.. 1 tab by mouth once daily 18)  Keppra 250 Mg Tabs (Levetiracetam) .Marland Kitchen.. 1 by mouth bid 19)  Zyrtec Hives Relief 10 Mg Tabs (Cetirizine hcl) .... Two times a day 20)  Protonix 40 Mg Tbec  (Pantoprazole sodium) .... Once daily 21)  Sustenex-probiotic  .... Once daily 22)  Hydroxyzine Hcl 25 Mg Tabs (Hydroxyzine hcl) .Marland Kitchen.. 1-2 tabs by mouth two times a day as needed itching 23)  Triamcinolone 0.5 % Cream in Eucerin Lotion 1:5  .... Use bid 24)  Hydroxyzine Hcl 25 Mg Tabs (Hydroxyzine hcl) .Marland Kitchen.. 1-2 tabs by mouth two times a day as needed itching 25)  Ketoconazole 2 % Crea (Ketoconazole) .... Use two times a day on scalp 26)  Prednisone 10 Mg Tabs (Prednisone) .... Take 40mg  qd for 3 days, then 20 mg qd for 3 days, then 10mg  qd for 6 days, then stop. take pc.  Patient Instructions: 1)  Please schedule a follow-up appointment in 2 months. Prescriptions: PREDNISONE 10 MG TABS (PREDNISONE) Take 40mg  qd for 3 days, then 20 mg qd for 3 days, then 10mg  qd for 6 days, then stop. Take pc.  #24 x 1   Entered and Authorized by:   [name] MD   Signed by:   [name] MD on 10/07/2009   Method used:   Print then Give to Patient   RxID:   [id number] KETOCONAZOLE 2 % CREA (KETOCONAZOLE) use two times a day on scalp  #90 g x 3   Entered and Authorized by:   [name] MD   Signed by:   [name] MD on 10/07/2009   Method used:   Print then Give to Patient   RxID:   [id number] HYDROXYZINE HCL 25 MG TABS (HYDROXYZINE HCL) 1-2 tabs by mouth two times a day as needed itching  #60 x 3   Entered and Authorized by:   [name] MD   Signed by:   [name] MD on 10/07/2009   Method used:   Print then Give to Patient   RxID:   [id number] TRIAMCINOLONE 0.5 % CREAM IN EUCERIN LOTION 1:5 use bid  #500 g x 6   Entered and Authorized by:   [name] MD   Signed by:   [name] MD on 10/07/2009   Method used:   Print then Give to Patient   RxID:   [id number]

## 2010-08-18 NOTE — Progress Notes (Signed)
Summary: triage   Phone Note Call from Patient Call back at Home Phone [phone]   Caller: Patient Call For: Dr Henrene Pastor Reason for Call: Talk to Nurse Summary of Call: Patient wants to be seen sooner than first available 10-21 for severe gerd, choking. Initial call taken by: Ronalee Red,  April 09, 2010 3:53 PM  Follow-up for Phone Call        Given appt. with PA for Tuesday. Follow-up by: Abel Presto RN,  April 09, 2010 4:15 PM

## 2010-08-18 NOTE — Progress Notes (Signed)
Summary: come off coumdain - dentist appt   Phone Note From Other Clinic   Caller: [place] office [phone] fax # [phone] Request: Talk with Nurse Details of Complaint: pt has appt today @ dentist office -2 p.m.. has question regarding her coumadin.  Initial call taken by: Neil Crouch,  May 05, 2010 9:16 AM  Follow-up for Phone Call        Spoke with Caryl Pina from @ dentist office. Caryl Pina states pt. is having dental work today. On the last dental visit  Caryl Pina states pt. needed peridental work  pt. was on Lovenox and was  off coumodin for 5 days. According to Caryl Pina now pt. states her PCP toll her she needed to be off Coumodin only two days. The dental office needed that in writing. I advice  Caryl Pina to call pt.s PCP because he is following pt's INR and coumodin dosing. Caryl Pina verbalized understanding.  Follow-up by: Carollee Sires, RN, BSN,  May 05, 2010 10:19 AM

## 2010-08-18 NOTE — Assessment & Plan Note (Signed)
Summary: 3 mos f/u / # / cd   Vital Signs:  Patient profile:   65 year old female Height:      61.5 inches Weight:      159 pounds BMI:     29.66 O2 Sat:      95 % on Room air Temp:     98.8 degrees F oral Pulse rate:   69 / minute Pulse rhythm:   regular Resp:     16 per minute BP sitting:   140 / 70  (right arm) Cuff size:   large  Vitals Entered By: Jonathon Resides, CMA(AAMA) (March 10, 2010 2:46 PM)  O2 Flow:  Room air CC: 3 mo f/u. c/o choking sensation from reflux? / excessive" spitting"/low blood sugar Is Patient Diabetic? Yes Comments pt is not using Nitro, Miralalx, Triamcinolone, Prednisone, Lovenox or Amoxicillin.  Please remove from list   Primary Care Provider:  Walker Kehr, MD  CC:  3 mo f/u. c/o choking sensation from reflux? / excessive" spitting"/low blood sugar.  History of Present Illness: C/o chocking and spitting x months after kidney surgery; a lot of mucus and chocking sensation. The food and water go down OK. She had seen Dr Vassie Loll - he ordered test. Crackers, lemon make it better  Current Medications (verified): 1)  Labetalol Hcl 100 Mg  Tabs (Labetalol Hcl) .... 2 Tabs Every Day 2)  Colchicine 0.6 Mg  Tabs (Colchicine) .... Take 1 By Mouth Two Times A Day As Needed 3)  Amlodipine Besylate 10 Mg  Tabs (Amlodipine Besylate) .... Take 1 By Mouth Qd 4)  Pravachol 40 Mg Tabs (Pravastatin Sodium) .Marland Kitchen.. 1 By Mouth Qd 5)  Coumadin 5 Mg Tabs (Warfarin Sodium) .... Tues and Thurs 1 Tab The Rest of The Week 1 1/2 Tab Once Daily 6)  Nitrolingual Duo Pack 0.4 Mg/spray Soln (Nitroglycerin) .... Use Sl As Needed As Dirr 7)  Diovan 160 Mg Tabs (Valsartan) .... Take 1 Tab in Am and 1 in Pm 8)  Miralax   Powd (Polyethylene Glycol 3350) .Marland Kitchen.. 17g By Mouth Once Daily As Needed Constipation 9)  Epogen 4000 Unit/ml Soln (Epoetin Alfa) .... As Directed Dialysis 10)  Renvela 800 Mg Tabs (Sevelamer Carbonate) .... Take 1 Tablet By Mouth Three Times A Day 11)  Vitamin C Cr  500 Mg Cr-Caps (Ascorbic Acid) .Marland Kitchen.. 1 Po Qd 12)  Triamcinolone Acetonide 0.5 % Crea (Triamcinolone Acetonide) .... As Needed 13)  Lidocaine-Prilocaine 2.5-2.5 % Crea (Lidocaine-Prilocaine) .Marland Kitchen.. 1-2 Hours Before Procedure 14)  Renal  Tabs (Multiple Vitamins-Minerals) .... Once Daily 15)  Onetouch Ultrasoft Lancets  Misc (Lancets) .... Test Two Times A Day As Needed 16)  Onetouch Ultra Test  Strp (Glucose Blood) .... Test Two Times A Day As Needed 17)  Sensipar 30 Mg Tabs (Cinacalcet Hcl) .Marland Kitchen.. 1 Tab By Mouth Once Daily 18)  Keppra 250 Mg Tabs (Levetiracetam) .Marland Kitchen.. 1 By Mouth Two Times A Day 19)  Zyrtec Hives Relief 10 Mg Tabs (Cetirizine Hcl) .... Two Times A Day 20)  Protonix 40 Mg Tbec (Pantoprazole Sodium) .... Once Daily 21)  Sustenex-Probiotic .... Once Daily 22)  Hydroxyzine Hcl 25 Mg Tabs (Hydroxyzine Hcl) .Marland Kitchen.. 1-2 Tabs By Mouth Two Times A Day As Needed Itching 23)  Triamcinolone 0.5 % Cream in Eucerin Lotion 1:5 .... Use Bid 24)  Hydroxyzine Hcl 25 Mg Tabs (Hydroxyzine Hcl) .Marland Kitchen.. 1-2 Tabs By Mouth Two Times A Day As Needed Itching 25)  Prednisone 10 Mg Tabs (Prednisone) .... Take 40mg   Qd For 3 Days, Then 20 Mg Qd For 3 Days, Then 10mg  Qd For 6 Days, Then Stop. Take Pc. 26)  Lovenox 80 Mg/0.68ml Soln (Enoxaparin Sodium) .... As Dirrected Once Daily Sq 27)  Amoxicillin 500 Mg Caps (Amoxicillin) .Marland Kitchen.. 1 Two Times A Day X 3 Day  Allergies (verified): 1)  ! Sulfa 2)  ! Bactrim Ds (Sulfamethoxazole-Trimethoprim)  Past History:  Past Medical History: Last updated: 10/14/2009 CORONARY ARTERY DISEASE (ICD-414.00)      a.  S/P Acute Inf. MI 05/2002, with PCI/Express BMS to RCA.  OTW nonobs dzs and NL EF.      b.  11/2003 - cath - Nonobs dzs.      c..  08/2007 - Adenosine MV: EF 49% (visually appeared better); Breast attenuation w/o ischemia. PAROXYSMAL ATRIAL FIBRILLATION (ICD-427.31) MYOCARDIAL INFARCTION, HX OF (ICD-412) HYPERTENSION (ICD-401.9) HYPERLIPIDEMIA (ICD-272.4) DIASTOLIC  CONGESTIVE HEART FAILURE      a.  06/2008, Echo:  ef 60-70%. 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   She had a right AV fistula.  She has had ,   nasal septal surgery, tonsillectomy, colonoscopy.  Hysterectomy Oophorectomy Tubal ligation Nephrectomy 2010 no Ca on bx     Social History: Last updated: 01/14/2009 Retired Married x 50 y 2010 Patient is a former smoker.  Alcohol Use - no Illicit Drug Use - no Daily Caffeine Use 1 cup coffee  Physical Exam  General:  Chronically ill-appearing - looks better NAD Nose:  External nasal examination shows no deformity or inflammation. Nasal mucosa are pink and moist without lesions or exudates. Mouth:  Oral mucosa and oropharynx without lesions or exudates.  Teeth in good repair. Neck:  No deformities, masses, or tenderness noted. Heart:  irreg, irreg s1/s2 no s3/s4/murmurs. Abdomen:  round, soft, nt/nd/bs+x4. Msk:  Lumbar-sacral spine is tender to palpation over paraspinal muscles and painfull with the  ROM  Neurologic:  Alert and oriented x 3. Skin:  Nodules and papules scattered Alopecia on L scalp Excoriations and erythematous rashes Psych:  depressed affect.     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 Once daily  Problem # 3:  PRURITUS (ICD-698.9) Assessment: Unchanged Poss. due to CRF/ESRD/HD  Problem # 4:  END STAGE RENAL DISEASE (ICD-585.6) Assessment: Unchanged On HD  Complete Medication List: 1)  Labetalol Hcl 100 Mg Tabs (Labetalol hcl) .... 2 tabs every day 2)  Colchicine 0.6  Mg Tabs (Colchicine) .... Take 1 by mouth two times a day as needed 3)  Amlodipine Besylate 10 Mg Tabs (Amlodipine besylate) .... Take 1 by mouth qd 4)  Pravachol 40 Mg Tabs (Pravastatin sodium) .Marland Kitchen.. 1 by mouth qd 5)  Coumadin 5 Mg Tabs (Warfarin sodium) .... Tues and thurs 1 tab the rest of the week 1 1/2 tab once daily 6)  Nitrolingual Duo Pack 0.4 Mg/spray Soln (Nitroglycerin) .... Use sl as needed as dirr 7)  Diovan 160 Mg Tabs (Valsartan) .... Take 1 tab in am and 1 in pm 8)  Miralax Powd (Polyethylene glycol 3350) .Marland Kitchen.. 17g by mouth once daily as needed constipation 9)  Epogen 4000 Unit/ml Soln (Epoetin alfa) .... As directed dialysis 10)  Renvela 800 Mg Tabs (Sevelamer carbonate) .... Take 1 tablet by mouth three times a day 11)  Vitamin C Cr 500 Mg Cr-caps (Ascorbic acid) .Marland Kitchen.. 1 po qd 12)  Triamcinolone Acetonide 0.5 % Crea (Triamcinolone acetonide) .... As needed 13)  Lidocaine-prilocaine 2.5-2.5 % Crea (Lidocaine-prilocaine) .Marland Kitchen.. 1-2 hours before procedure 14)  Renal  Tabs (Multiple vitamins-minerals) .... Once daily 15)  Onetouch Ultrasoft Lancets Misc (Lancets) .... Test two times a day as needed 16)  Onetouch Ultra Test Strp (Glucose blood) .... Test two times a day as needed 17)  Sensipar 30 Mg Tabs (Cinacalcet hcl) .Marland Kitchen.. 1 tab by mouth once daily 18)  Keppra 250 Mg Tabs (Levetiracetam) .Marland Kitchen.. 1 by mouth two times a day 19)  Zyrtec Hives Relief 10 Mg Tabs (Cetirizine hcl) .... Two times a day 20)  Sustenex-probiotic  .... Once daily 21)  Hydroxyzine Hcl 25 Mg Tabs (Hydroxyzine hcl) .Marland Kitchen.. 1-2 tabs by mouth two times a day as needed itching 22)  Triamcinolone 0.5 % Cream in Eucerin Lotion 1:5  .... Use bid 23)  Hydroxyzine Hcl 25 Mg Tabs (Hydroxyzine hcl) .Marland Kitchen.. 1-2 tabs by mouth two times a day as needed itching 24)  Lovenox 80 Mg/0.81ml Soln (Enoxaparin sodium) .... As dirrected once daily sq 25)  Sudafed 30 Mg Tabs (Pseudoephedrine hcl) .Marland Kitchen.. 1 - 2 tabs by mouth two times a day as needed congestion  Patient Instructions: 1)  Try Dexilant instead of Protonix Prescriptions: SUDAFED 30 MG TABS (PSEUDOEPHEDRINE HCL) 1 - 2 tabs by mouth two times a day as needed congestion  #100 x 3   Entered and Authorized by:   [name] MD   Signed by:   [name] MD on 03/10/2010   Method used:   Print then Give to Patient   RxID:   [id number]

## 2010-08-18 NOTE — Medication Information (Signed)
Summary: Appeal for [employer] Pt [facility]ist Program  Appeal for Lovenox/[employer] [facility]   Imported By: Phillis Knack 11/28/2009 07:27:46  _____________________________________________________________________  External Attachment:    Type:   Image     Comment:   External Document

## 2010-08-18 NOTE — Progress Notes (Signed)
Summary: Lovenox  Phone Note Call from Patient   Summary of Call: Pt's dialysis manages her INR. See recent phone notes regarding dental apts and lovenox. She has been on lovenox x 3 wks. Dialysis has told pt to keep on lovenox. Pt c/o increased hematomas on hips, chest & buttocks. Dr Bjorn Pippin has told pt to stop. Pt wants to know if it's ok to stop lovenox and resume coumadin.   Initial call taken by: Charlsie Quest, [place],  February 03, 2010 1:43 PM  Follow-up for Phone Call        Pls ask Dr Florene Glen if he is managing anticoagulation.  Is she finished w/her dental procedures? Is her itching better? Follow-up by: [name] MD,  February 03, 2010 5:47 PM  Additional Follow-up for Phone Call Additional follow up Details #1::        Pt will stop lovenox per Dr Florene Glen. Pt will f/u w/dialysis who manages INR. Per pt INR last thursday was 1.8. She has completed dental procedures and only complaints now are painful hematomas. She has used heat w/some relief. Pt will contact office if they do not improve or become worse.   Itching is worse after dialysis but the next day she is signifigantly better. Her dermatologist, Dr Ronnald Ramp gave pt cordran 4 micrograms/sqcm tape. She uses it on the bad spots on her legs. It is expensive for 2 feet of tape but gives some relief.  Additional Follow-up by: Charlsie Quest, [place],  February 03, 2010 6:17 PM    Additional Follow-up for Phone Call Additional follow up Details #2::    noted Thx Follow-up by: [name] MD,  February 03, 2010 9:19 PM

## 2010-08-18 NOTE — Letter (Signed)
Summary: [facility]   Imported By: Bubba Hales 11/12/2009 10:31:04  _____________________________________________________________________  External Attachment:    Type:   Image     Comment:   External Document

## 2010-08-18 NOTE — Letter (Signed)
Summary: Generic Letter  [facility]   [place], [place] [address]   Phone: [phone]  Fax: [phone]    12/03/2009  RE: JOSHLYN SCHIMMOELLER [address] [place], [place]  [address]  ATTN: [facility] [place]   Ms Fontes needs to have two seperate appointments for deep cleaning of her teeth. Due to bleeding during the procedure she must be bridged with Lovenox and stop coumadin. We have been informed that she has her INR managed by your facility. Below are the instructions per Dr Alain [place].  STOP COUMDIN 4 Days prior to deep teeth cleaning Check INR once daily When INR is less than 1.7 START Lovenox 80mg  subcutaneously once daily DO NOT TAKE Lovenox on the day of deep teeth cleaning Next day following deep teeth cleaning restart coumadin and lovenox STOP Lovenox when INR is greater than 2.0 and continue coumadin  Please note: Patient does NOT need to stop coumadin before her crown or dental fillings.   Please let us know if you can assist with these instructions. Patient already has rx for lovenox and has used medication in the past. We need assistance monitoring her INR.    Sincerely,   Charlsie Quest, CMA (AAMA) for Dr A. Plotnikov [phone]  Appended Document: Generic Letter Faxed to Dialysis center [phone] also attached was letter to patient.

## 2010-08-18 NOTE — Assessment & Plan Note (Signed)
Summary: Spitting phlegm, surveillance colonoscopy    History of Present Illness Primary GI MD: Scarlette Shorts MD Primary Provider: Walker Kehr, MD Requesting Provider: n/a Chief Complaint: Increase in mucus and drainage. Pt states she chokes on her saliva and spits mucus all the time. Pt does have intermittant N/V and also alternating constipation and diarrhea. History of Present Illness:   65 year old female with multiple significant medical problems, including, but not limited to, diabetes mellitus, hypertension, coronary artery disease with prior myocardial infarction, paroxysmal atrial fibrillation with CVA on chronic Coumadin, end-stage renal disease on hemodialysis, sleep apnea, hyperlipidemia, benign lesion of the kidney status post unilateral nephrectomy, GERD, adenomatous polyps, and diverticular disease. She is accompanied by her husband. She presents today regarding habitual spitting as well as the need for surveillance colonoscopy. Patient was seen by our physician assistant April 14, 2010 regarding chronic spitting of thick phlegm. No vomiting. She is on Dexilant without improvement. Mucinex without improvement. Negative barium swallows well as negative gastric emptying scan. During the interview she spits in a cup. No coughing. Her last upper endoscopy in November of 2008 revealed mild esophagitis, mild duodenitis with H. pylori. This was treated. She underwent cholecystectomy for symptomatic gallstones. Her last colonoscopy was performed December 2006. This was normal except for diverticulosis. Previous colonoscopies in 2003 and 2000. No new active lower GI complaints, but does have chronic alternating bowel habits. She has required a bridge with Lovenox therapy when she is come off Coumadin previously. She does not like the strategy.   GI Review of Systems    Reports acid reflux, nausea, and  vomiting.      Denies abdominal pain, belching, bloating, chest pain, dysphagia with  liquids, dysphagia with solids, heartburn, loss of appetite, vomiting blood, weight loss, and  weight gain.      Reports constipation, diarrhea, and  diverticulosis.     Denies anal fissure, black tarry stools, change in bowel habit, fecal incontinence, heme positive stool, hemorrhoids, irritable bowel syndrome, jaundice, light color stool, liver problems, rectal bleeding, and  rectal pain.    Current Medications (verified): 1)  Labetalol Hcl 100 Mg  Tabs (Labetalol Hcl) .... 2 Tabs Every Day 2)  Amlodipine Besylate 10 Mg  Tabs (Amlodipine Besylate) .... Take 1 By Mouth Qd 3)  Pravachol 40 Mg Tabs (Pravastatin Sodium) .Marland Kitchen.. 1 By Mouth Qd 4)  Coumadin 5 Mg Tabs (Warfarin Sodium) .... Tues and Thurs 1 Tab The Rest of The Week 1 1/2 Tab Once Daily 5)  Diovan 160 Mg Tabs (Valsartan) .... Take 1 Tab in Am and 1 in Pm 6)  Epogen 4000 Unit/ml Soln (Epoetin Alfa) .... As Directed Dialysis 7)  Renvela 800 Mg Tabs (Sevelamer Carbonate) .... Take 1 Tablet By Mouth Three Times A Day 8)  Vitamin C Cr 500 Mg Cr-Caps (Ascorbic Acid) .Marland Kitchen.. 1 Po Qd 9)  Lidocaine-Prilocaine 2.5-2.5 % Crea (Lidocaine-Prilocaine) .Marland Kitchen.. 1-2 Hours Before Procedure 10)  Renal  Tabs (Multiple Vitamins-Minerals) .... Once Daily 11)  Onetouch Ultrasoft Lancets  Misc (Lancets) .... Test Two Times A Day As Needed 12)  Onetouch Ultra Test  Strp (Glucose Blood) .... Test Two Times A Day As Needed 13)  Sensipar 30 Mg Tabs (Cinacalcet Hcl) .Marland Kitchen.. 1 Tab By Mouth Once Daily 14)  Keppra 250 Mg Tabs (Levetiracetam) .Marland Kitchen.. 1 By Mouth Two Times A Day 15)  Hydroxyzine Hcl 25 Mg Tabs (Hydroxyzine Hcl) .Marland Kitchen.. 1-2 Tabs By Mouth Two Times A Day As Needed Itching 16)  Dexilant 60 Mg Cpdr (  Dexlansoprazole) .Marland Kitchen.. 1 By Mouth Once Daily 17)  Nystatin 100000 Unit/ml Susp (Nystatin) .... Take 5 Cc Orally and Gargle and Swallow  Allergies (verified): 1)  ! Sulfa 2)  ! Bactrim Ds (Sulfamethoxazole-Trimethoprim) 3)  ! * Red Dye 4)  ! * Plastic Tape  Past  History:  Past Medical History: Reviewed history from 04/28/2010 and no changes required. CORONARY ARTERY DISEASE (ICD-414.00)      a.  S/P Acute Inf. MI 05/2002, with PCI/Express BMS to RCA.  OTW nonobs dzs and NL EF.      b.  11/2003 - cath - Nonobs dzs.      c..  08/2007 - Adenosine MV: EF 49% (visually appeared better); Breast attenuation w/o ischemia. PAROXYSMAL ATRIAL FIBRILLATION (ICD-427.31) MYOCARDIAL INFARCTION, HX OF (ICD-412) HYPERTENSION (ICD-401.9) HYPERLIPIDEMIA (ICD-272.4) DIASTOLIC CONGESTIVE HEART FAILURE      a.  06/2008, Echo:  ef 60-70%. 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 PCI of Rt Coronary Artery Cholecystectomy 2009  She has had removal of a left AV graft.   She had a right AV fistula.  She has had ,   nasal septal surgery, tonsillectomy, colonoscopy.  Hysterectomy Oophorectomy Tubal ligation Nephrectomy 2010 no Ca on bx     Family History: Reviewed history from 12/30/2008 and no changes required. Family History of Diabetes: multiple family members  Her family history is remarkable for coronary artery disease.   Her   father died of a stroke in his 22s.  No FH of Colon Cancer:  Social History: Reviewed history from 01/14/2009 and no changes required. Retired Married x 19 y 2010 Patient is a former smoker.  Alcohol Use - no Illicit Drug Use - no Daily Caffeine Use 1 cup coffee  Review of Systems  The patient denies allergy/sinus, anemia, anxiety-new, arthritis/joint pain, back pain, blood in urine, breast changes/lumps, change in vision, confusion, coughing up blood, depression-new, fainting, fatigue, fever, headaches-new, hearing problems, heart murmur, heart rhythm changes, itching, menstrual pain, muscle pains/cramps, night sweats, nosebleeds, pregnancy symptoms, shortness of breath, skin rash, sleeping problems, sore throat, swelling of feet/legs, swollen lymph glands, thirst - excessive , urination - excessive , urination changes/pain, urine leakage, vision changes, voice change, and cough.    Vital Signs:  Patient profile:   66 year old female Height:      61.5 inches Weight:      162.50 pounds BMI:     30.32 Pulse rate:   76 / minute Pulse rhythm:   regular BP sitting:   158 / 72  (right arm) Cuff size:   regular  Vitals Entered By: Marlon Pel CMA Deborra Medina) (May 27, 2010 10:01 AM)  Physical Exam  General:  Well developed, well nourished, no acute distress. Head:  Normocephalic and atraumatic. Eyes:  anicteric Mouth:  No deformity or lesions. No thrush Neck:  Supple; no masses or  thyromegaly. Lungs:  Clear throughout to auscultation. Heart:  Regular rate and rhythm; no murmurs, rubs,  or bruits. Abdomen:  Soft, nontender and nondistended. No masses, hepatosplenomegaly or hernias noted. Normal bowel sounds. Rectal:  deferred until colonoscopy Msk:   Normal posture. Pulses:  Normal pulses noted. Extremities:  no edema Neurologic:  alert oriented Skin:  Intact without significant lesions or rashes. Psych:  Alert and cooperative. Normal mood and affect.   Impression &  Recommendations:  Problem # 1:  chronic spitting up phlegm uncertain whether this is organic or functional. Negative ENT workup. This certainly is NOT a GI problem. She should return to her primary provider for further assessment  Problem # 2:  COLONIC POLYPS, HX OF (ICD-V12.72) history of adenomatous colon polyps. Last exam in 2006 negative for neoplasia. The patient is at [place] for procedure work given her comorbidities, including end-stage renal disease with dialysis, and her need for chronic Coumadin therapy. We discussed the nature of colonoscopy as well as the risks, benefits, and alternatives. We also discussed the pros and cons of continuing or interrupting her Coumadin therapy with a Lovenox bridge. After a complete discussion, we have mutually elected to perform her procedure on Coumadin realizing that we can deal adequately with small pathology and that large pathology may warrant a repeat exam. She understood all of this and agreed to proceed. We will need to perform this on an on dialysis day. We have prescribed Movi prep. She has been instructed on its use. Also, we will have her check her PT and INR a few days before the procedure to make sure that it is not supra-therapeutic.  Other Orders: Colonoscopy (Colon)  Patient Instructions: 1)  Copy sent to : Walker Kehr, MD 2)  colonoscopy [place] 06/09/10 11:30 am arrive at 10:30 am 3)  Movi prep instructions given  4)  Movi prep sent to pharmacy. 5)  Stay on Coumadin per Dr. Henrene Pastor. 6)  You will have your PT/INR checked on Friday morning 06/05/10 on basement floor here at [facility]. 7)  The medication list was reviewed and reconciled.  All changed / newly prescribed medications were explained.  A complete medication list was provided to the patient / caregiver. Prescriptions: MOVIPREP 100 GM  SOLR (PEG-KCL-NACL-NASULF-NA ASC-C) As per prep instructions.  #1 x 0   Entered by:   Randye Lobo NCMA   Authorized by:   Irene Shipper MD    Signed by:   Randye Lobo NCMA on 05/27/2010   Method used:   Electronically to        [id number]* (retail)       [address] [place], [place]  [address]       Ph: [phone]       Fax: [phone]   RxID:   [id number]

## 2010-08-18 NOTE — Miscellaneous (Signed)
Summary: Orders Update  Clinical Lists Changes  Orders: Added new Test order of Carotid Duplex (Carotid Duplex) - Signed 

## 2010-08-18 NOTE — Progress Notes (Signed)
Summary: REGLAN [facility]RECTION   Phone Note From Pharmacy   Summary of Call: Pharm called. Reglan was sent in for solution - pt takes tabs in other meds and directions are written for tabs. Gave verbal to change to reglan to tabs. EMR updated.  Initial call taken by: Charlsie Quest, [place],  June 16, 2010 3:53 PM  Follow-up for Phone Call        sorry - thx Follow-up by: [name] MD,  June 16, 2010 6:13 PM    New/Updated Medications: METOCLOPRAMIDE HCL 5 MG TABS (METOCLOPRAMIDE HCL) 1 three times a day before meals Prescriptions: METOCLOPRAMIDE HCL 5 MG TABS (METOCLOPRAMIDE HCL) 1 three times a day before meals  #90 x 5   Entered by:   Charlsie Quest, CMA   Authorized by:   [name] MD   Signed by:   Charlsie Quest, CMA on 06/16/2010   Method used:   Telephoned to ...       [facility] [address] (retail)       [address] [place], [place]  [address]       Ph: [phone]       Fax: [phone]   RxID:   [id number]

## 2010-08-18 NOTE — Letter (Signed)
Summary: Neurology[facility]  Neurology/[facility]   Imported By: Bubba Hales 08/01/2009 08:20:31  _____________________________________________________________________  External Attachment:    Type:   Image     Comment:   External Document

## 2010-08-18 NOTE — Progress Notes (Signed)
Summary: FLONASE  Phone Note Call from Patient   Summary of Call: Patient is requesting rx for flonase. She was given rx for generic flonase and didn't know that she did have flonase. She says she does not thinks it works as well as in the past. Advised pt to come in for office visit if symptoms become worse do not get better. She agreed.  Initial call taken by: Charlsie Quest, CMA,  May 27, 2010 11:30 AM  Follow-up for Phone Call        agree Thank you!  Follow-up by: [name] MD,  May 28, 2010 5:51 PM    New/Updated Medications: FLONASE 50 MCG/ACT SUSP (FLUTICASONE PROPIONATE) as directed

## 2010-08-18 NOTE — Letter (Signed)
Summary: Patient Notice- Polyp Results  [facility]  [address] [place], [place] [address]   Phone: [phone]  Fax: [phone]        June 14, 2010 MRN: [id number]    [name] [address] [place],   [address]    Dear [name],  I am pleased to inform you that the colon polyp(s) removed during your recent colonoscopy was (were) found to be benign (no cancer detected) upon pathologic examination.  I recommend you have a repeat colonoscopy examination in 5 years to look for recurrent polyps, as having colon polyps increases your risk for having recurrent polyps or even colon cancer in the future.  Should you develop new or worsening symptoms of abdominal pain, bowel habit changes or bleeding from the rectum or bowels, please schedule an evaluation with either your primary care physician or with me.  Additional information/recommendations:  __ No further action with gastroenterology is needed at this time. Please      follow-up with your primary care physician for your other healthcare      needs.    Please call us if you are having persistent problems or have questions about your condition that have not been fully answered at this time.  Sincerely,  Irene Shipper MD  This letter has been electronically signed by your physician.  Appended Document: Patient Notice- Polyp Results Letter mailed

## 2010-08-18 NOTE — Progress Notes (Signed)
Summary: Pt calling regarding having dental work  Medications Added LOVENOX 80 MG/0.8ML SOLN (ENOXAPARIN SODIUM) as dirrected once daily sq       Phone Note Call from Patient Call back at [facility] Phone [phone]   Caller: Patient Summary of Call: Pt calling with questions about dental work Initial call taken by: Delsa Sale,  November 04, 2009 1:38 PM  Follow-up for Phone Call        spoke with pt, she is needing dental work and needs clearence, if she needs pre-med and if okay to hold coumadin prior to procedure. info needs to be faxed to dr sadri @ [phone]. will foward for dr [name] review [name], RN  November 04, 2009 5:21 PM   Additional Follow-up for Phone Call Additional follow up Details #1::        No need for sbe prophylaxis; if she must come off coumadin, she will need lovenox bridge as she has h/o embolic cva. Colin Mulders, MD, [facility]  November 05, 2009 8:25 AM  pt aware that she will need lovenox shots for procedure. pt states dr Alain [place] takes care of her coumadin and he has given her the shots before. will foward to dr plotnikov for his review. [name], RN  November 05, 2009 4:12 PM     Additional Follow-up for Phone Call Additional follow up Details #2::    Stop Coumadin 4 d prior to surgery Check INR once daily When INR<1.7 start Lovenox 80 mg subcutaneously once daily Hold Lovenox on the day of surgery Next day following surgery restart Coumadin and Lovenox Stop Lovenox when INR>2.0 and cont Coumadin  Follow-up by: [name] MD,  November 06, 2009 1:32 PM  New/Updated Medications: LOVENOX 80 MG/0.8ML SOLN (ENOXAPARIN SODIUM) as dirrected once daily sq Prescriptions: LOVENOX 80 MG/0.8ML SOLN (ENOXAPARIN SODIUM) as dirrected once daily sq  #10 x 1   Entered by:   [name] MD   Authorized by:   Colin Mulders, MD, [name]   Signed by:   [name] MD on 11/10/2009   Method used:   Electronically to   [facility] [phone]* (retail)       [address] [place], [place]  [address]       Ph: [phone]       Fax: [phone]   RxIDXV:[id number]   Appended Document: Pt calling regarding having dental work Spoke w/pt, she is having a crown, chip repaired and cleaning. Was told that this would take 4 visits. Have called pt's dentist and I am awaiting a call back.

## 2010-08-18 NOTE — Letter (Signed)
Summary: [facility]  [facility]   Imported By: Bubba Hales 04/22/2010 08:57:16  _____________________________________________________________________  External Attachment:    Type:   Image     Comment:   External Document

## 2010-08-18 NOTE — Letter (Signed)
Summary: [facility]   Imported By: Bubba Hales 09/17/2009 10:35:39  _____________________________________________________________________  External Attachment:    Type:   Image     Comment:   External Document

## 2010-08-18 NOTE — Assessment & Plan Note (Signed)
Summary: Gerd/dexilant not helping    History of Present Illness Visit Type: Follow-up Visit Primary GI MD: Scarlette Shorts MD Primary Provider: Walker Kehr, MD Requesting Provider: Fleet Contras, MD Chief Complaint: GERD, Can not sleep at night and has alot of mucus History of Present Illness:   65 YO FEMALE KNOWN TO DR. Skippy Marhefka. 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 SHE IS S/P LEFT NEPHRECTOMY IN 6/10.SHE HAS DX OF GERD,AND  HAD BEEN MAINTAINED ON PROTONIX,RECENTLY SWITHCHED TO DEXILANT PER DR PLOTNIKOV. SHE LAST HAD AN EGD IN 2008 WHICH SHOWED MILD DUODENITIS,MILD ESOPHAGITIS NO STRICTURE.  SHE COMES IN TODAY WITH C/O  A CHOKING SENSATION WHICH HAS BEEN BOTHERING HER SINCE JUNE. SHE C/O  HAVING THICK SLIMY PHLEGM IN HER THROAT  ALL THE TIME,AND BELIEVES THIS IS COMING FROM HER STOMACH. THIS PHLEGM IS THICK ,FEELS LIKE SOMETHING STUCK IN HER THROAT AND  CAUSES HER TO GAG AND CHOKE. SHE DENIES DIFFICULTY WITH SWALLOWING,AND HAS NO TROUBLE GETTING LIQUIDS DOWN,OR SOLIDS GENERALLY ,BUT AFTER SHE EATS THE SECRETIONS START AND SHE HAS TO SPIT OUT ALOT OF PHLEGM REPEATEDLY. SHE FEELS THE SAME IN THE MORNINGS ON AWAKENING , AND BRINGS UP FOAMY BUBBLY PHLEGM. NO HEARTBURN OR INDIGESTION, NO ABDOMINAL PAIN,WEIGHT STABLE. PT FEELS SHE IS REFLUXING . SHE WAS EVALUATED BY ENT RECENTLY-DR. BYERS WHO DID NOT APPARENTLY FEEL HER PROBLEMS WERE  SINUS RELATED ETC. HE SENT HER FOR A BARIUM SWALLOW . WE HAVE A COPY OF THIS REPORT , AND WAS NEGATIVE WITH ,NO STRICTURE,MOTILITY NORMAL,AND NO REFLUX ELICITED.  SHE WAS GIVEN A RECENT TRIAL OF MUCINEX WITH NO BENEFIT.   GI Review of Systems    Reports acid reflux and  nausea.      Denies abdominal pain, belching, bloating, chest pain, dysphagia with liquids, dysphagia with solids, heartburn, loss of appetite, vomiting, vomiting blood, weight loss, and  weight gain.          Denies anal fissure, black tarry stools, change in bowel habit, constipation, diarrhea, diverticulosis, fecal incontinence, heme positive stool, hemorrhoids, irritable bowel syndrome, jaundice, light color stool, liver problems, rectal bleeding, and  rectal pain.    Current Medications (verified): 1)  Labetalol Hcl 100 Mg  Tabs (Labetalol Hcl) .... 2 Tabs Every Day 2)  Amlodipine Besylate 10 Mg  Tabs (Amlodipine Besylate) .... Take 1 By Mouth Qd 3)  Pravachol 40 Mg Tabs (Pravastatin Sodium) .Marland Kitchen.. 1 By Mouth Qd 4)  Coumadin 5 Mg Tabs (Warfarin Sodium) .... Tues and Thurs 1 Tab The Rest of The Week 1 1/2 Tab Once Daily 5)  Diovan 160 Mg Tabs (Valsartan) .... Take 1 Tab in Am and 1 in Pm 6)  Epogen 4000 Unit/ml Soln (Epoetin Alfa) .... As Directed Dialysis 7)  Renvela 800 Mg Tabs (Sevelamer Carbonate) .... Take 1 Tablet By Mouth Three Times A Day 8)  Vitamin C Cr 500 Mg Cr-Caps (Ascorbic Acid) .Marland Kitchen.. 1 Po Qd 9)  Lidocaine-Prilocaine 2.5-2.5 % Crea (Lidocaine-Prilocaine) .Marland Kitchen.. 1-2 Hours Before Procedure 10)  Renal  Tabs (Multiple Vitamins-Minerals) .... Once Daily 11)  Onetouch Ultrasoft Lancets  Misc (Lancets) .... Test Two Times A Day As Needed 12)  Onetouch Ultra Test  Strp (Glucose Blood) .... Test Two Times A Day As Needed 13)  Sensipar 30 Mg Tabs (Cinacalcet Hcl) .Marland Kitchen.. 1 Tab By Mouth Once Daily 14)  Keppra 250 Mg Tabs (Levetiracetam) .Marland Kitchen.. 1 By Mouth Two Times A Day 15)  Hydroxyzine Hcl  25 Mg Tabs (Hydroxyzine Hcl) .Marland Kitchen.. 1-2 Tabs By Mouth Two Times A Day As Needed Itching 16)  Dexilant 60 Mg Cpdr (Dexlansoprazole) .Marland Kitchen.. 1 By Mouth Once Daily  Allergies (verified): 1)  ! Sulfa 2)  ! Bactrim Ds (Sulfamethoxazole-Trimethoprim) 3)  ! * Red Dye 4)  ! * Plastic Tape  Past History:  Past Medical History: Last updated: 10/14/2009 CORONARY ARTERY DISEASE (ICD-414.00)      a.  S/P Acute Inf. MI 05/2002, with PCI/Express BMS to RCA.  OTW nonobs dzs and NL EF.      b.  11/2003 - cath - Nonobs  dzs.      c..  08/2007 - Adenosine MV: EF 49% (visually appeared better); Breast attenuation w/o ischemia. PAROXYSMAL ATRIAL FIBRILLATION (ICD-427.31) MYOCARDIAL INFARCTION, HX OF (ICD-412) HYPERTENSION (ICD-401.9) HYPERLIPIDEMIA (ICD-272.4) DIASTOLIC CONGESTIVE HEART FAILURE      a.  06/2008, Echo:  ef 60-70%. 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   She had a right AV fistula.  She has had ,   nasal septal surgery, tonsillectomy, colonoscopy.  Hysterectomy Oophorectomy Tubal ligation Nephrectomy 2010 no Ca on bx     Family History: Last updated: 12/30/2008 Family History of Diabetes: multiple family members  Her family history is remarkable for coronary artery disease.  Her   father died of a stroke in his 64s.  No FH of Colon Cancer:  Social History: Last updated: 01/14/2009 Retired Married x 50 y 2010 Patient is a former smoker.  Alcohol Use - no Illicit Drug Use - no Daily Caffeine Use 1 cup coffee  Review of Systems       The patient  complains of cough, sleeping problems, and sore throat.  The patient denies allergy/sinus, anemia, anxiety-new, arthritis/joint pain, back pain, blood in urine, breast changes/lumps, change in vision, confusion, coughing up blood, depression-new, fainting, fatigue, fever, headaches-new, hearing problems, heart murmur, heart rhythm changes, itching, menstrual pain, muscle pains/cramps, night sweats, nosebleeds, pregnancy symptoms, shortness of breath, skin rash, swelling of feet/legs, swollen lymph glands, thirst - excessive , urination - excessive , urination changes/pain, urine leakage, vision changes, and voice change.         SEE HPI  Vital Signs:  Patient profile:   65 year old female Height:      61.5 inches Weight:      159 pounds BMI:     29.66 BSA:     1.73 Pulse rate:   76 / minute Pulse rhythm:   regular BP sitting:   132 / 76  (left arm)  Vitals Entered By: Genella Mech CMA Deborra Medina) (April 14, 2010 11:09 AM)  Physical Exam  General:  Well developed, well nourished, ,CHRONICALLY ILL APPEARING Head:  Normocephalic and atraumatic. Eyes:  PERRLA, no icterus. Neck:  Supple;  no masses or thyromegaly. Lungs:  Clear throughout to auscultation. Heart:  Regular rate and rhythm; no murmurs, rubs,  or bruits. Abdomen:  SOFT, NONTENDER, NO MASS OR HSM,BS+ Rectal:  NOT DONE Extremities:  No clubbing, cyanosis, edema or deformities noted. Neurologic:  Alert and  oriented x4;  grossly normal neurologically. Psych:  Alert and cooperative. Normal mood and affect.   Impression & Recommendations:  Problem # 1:  GERD (ICD-530.81) Assessment Deteriorated 65 YO FEMALE WITH SEVERAL MONTH HX OF CHOKING SENSATION SECONDARY TO THICK PHLEGM IN THROAT. ETIOLOGY IS UNCLEAR-PT CONVINCED THIS IS REFLUX RELATED -DOUBT WITH RECENT NEGATIVE BARIUM SWALLOW. WILL R/O GAQSTROPARESIS  AS PT FEELS SECRETIONS ARE FROM STOMACH. WONDER ABOUT A SIALORRHEA TYPE PROCESS,OR POOLING DUE TO NEUROGENIC ISSUES FROM  PREVIOUS CVA. R/O CANDIDIASIS.  PT DID NOT FIND MUCINEX HELPFUL CONTINUE DEXILANT 60 MG DAILY WILL GIVE EMPIRIC TRIAL OF MYCOSTATIN ORAL SUSPENSION 5 CC 4 X DAILY X 2 WEEKS SCHEDULE FOR GASTRIC EMPTYING SCAN OBTAIN DR BYERS  NOTES. FURTHER PLANS PENDING EMPTYING SCAN.  Problem # 2:  RENAL FAILURE-CHRONIC (ICD-585.9) Assessment: Comment Only  Problem # 3:  CORONARY ARTERY DISEASE (ICD-414.00) Assessment: Comment Only  Problem # 4:  PAROXYSMAL ATRIAL FIBRILLATION (ICD-427.31) Assessment: Comment Only  Problem # 5:  CONGESTIVE HEART FAILURE (ICD-428.0) Assessment: Comment Only  Problem # 6:  CEREBROVASCULAR ACCIDENT (ICD-434.91) Assessment: Comment Only  Other Orders: Gastric Emptying Scan (GES)  Patient Instructions: 1)  Take the Dexilant 1 capsule 30 min before breakfast. 2)  We scheduled the Gastric Emptying Scan for 04-23-10 at [facility]. 3)  Directions provided. 4)  We sent a prescription for Nystatin 5 cc gargle & Swallow to  [facility]. 5)  Copy sent to : Dr.A.  Plotnikov 6)                         Dr.Michael  Mattingly 7)  The medication list was reviewed and reconciled.  All changed / newly prescribed medications were explained.  A complete medication list was provided to the patient / caregiver. Prescriptions: NYSTATIN 100000 UNIT/ML SUSP (NYSTATIN) Take 5 cc orally and gargle and swallow  #280 cc x 0   Entered by:   Marisue Humble NCMA   Authorized by:   Alfredia Ferguson PA-c   Signed by:   Marisue Humble NCMA on 04/14/2010   Method used:   Electronically to        [facility] [phone]* (retail)       [address] [place], [place]  [address]       Ph: [phone]       Fax: [phone]   RxID:   [id number]

## 2010-08-18 NOTE — Letter (Signed)
Summary: Generic Letter  [facility]   [place], [place] [address]   Phone: [phone]  Fax: [phone]    05/05/2010    Kenicia Sicking 08/31/1945  Please be advised the above named patient should hold coumadin 2 days prior to dental cleanings.  If you have any questions, please call our office.    Sincerely,   Jonathon Resides, [name])  Appended Document: Generic Letter faxed above letter to [facility] @ [phone].

## 2010-08-18 NOTE — Assessment & Plan Note (Signed)
Summary: f6w/discuss echo/dm    Visit Type:  Follow-up echo Referring Myrle Wanek:  n/a [facility] Alguire:  Walker Kehr, MD  CC:  chest pains.  History of Present Illness: Ms. Smothermon is a pleasant  female, who has a history of coronary artery disease, status post PCI of the right coronary artery. Her last Myoview was performed on August 29, 2007.  Her ejection fraction was calculated at 49%, but visually appeared better.  There was breast attenuation, but no ischemia. The patient apparently was admitted in December of [id number] with an embolic CVA. An echocardiogram showed normal LV function and a small pericardial effusion. She subsequently had a TEE which revealed normal LV function and a moderate effusion. There were no vegetations. She also apparently was placed on Coumadin with a diagnosis of paroxysmal atrial fibrillation. Carotid Dopplers performed in August 2011 showed 40-59% left stenosis and 0-39% right; f/u recommended in 2 years.  I last saw her in October of 2011 and ordered a repeat echocardiogram. This revealed normal LV function, mild aortic stenosis with a mean gradient of 17 mm of mercury, mild mitral regurgitation, grade 2 diastolic dysfunction and a moderate pericardial effusion. catheter there was moderate left atrial enlargement. Since then the patient has dyspnea with more extreme activities but not with routine activities. It is relieved with rest. It is not associated with chest pain. There is no orthopnea, PND or pedal edema. There is no syncope or palpitations. There is no exertional chest pain.   Current Medications (verified): 1)  Labetalol Hcl 100 Mg  Tabs (Labetalol Hcl) .... 2 Tabs Every Day 2)  Amlodipine Besylate 10 Mg  Tabs (Amlodipine Besylate) .... Take 1 By Mouth Qd 3)  Pravachol 40 Mg Tabs (Pravastatin Sodium) .Marland Kitchen.. 1 By Mouth Qd 4)  Coumadin 5 Mg Tabs (Warfarin Sodium) .... Tues and Thurs 1 Tab The Rest of The Week 1 1/2 Tab Once Daily 5)  Diovan 160 Mg Tabs  (Valsartan) .... Take 1 Tab in Am and 1 in Pm 6)  Epogen 4000 Unit/ml Soln (Epoetin Alfa) .... As Directed Dialysis 7)  Renvela 800 Mg Tabs (Sevelamer Carbonate) .... Take 1 Tablet By Mouth Three Times A Day 8)  Vitamin C Cr 500 Mg Cr-Caps (Ascorbic Acid) .Marland Kitchen.. 1 Po Qd 9)  Lidocaine-Prilocaine 2.5-2.5 % Crea (Lidocaine-Prilocaine) .Marland Kitchen.. 1-2 Hours Before Procedure 10)  Renal  Tabs (Multiple Vitamins-Minerals) .... Once Daily 11)  Onetouch Ultrasoft Lancets  Misc (Lancets) .... Test Two Times A Day As Needed 12)  Onetouch Ultra Test  Strp (Glucose Blood) .... Test Two Times A Day As Needed 13)  Sensipar 30 Mg Tabs (Cinacalcet Hcl) .Marland Kitchen.. 1 Tab By Mouth Once Daily 14)  Keppra 250 Mg Tabs (Levetiracetam) .Marland Kitchen.. 1 By Mouth Two Times A Day 15)  Hydroxyzine Hcl 25 Mg Tabs (Hydroxyzine Hcl) .Marland Kitchen.. 1-2 Tabs By Mouth Two Times A Day As Needed Itching 16)  Dexilant 60 Mg Cpdr (Dexlansoprazole) .Marland Kitchen.. 1 By Mouth Once Daily 17)  Nystatin 100000 Unit/ml Susp (Nystatin) .... Take 5 Cc Orally and Gargle and Swallow 18)  Flonase 50 Mcg/act Susp (Fluticasone Propionate) .... As Directed  Allergies: 1)  ! Sulfa 2)  ! Bactrim Ds (Sulfamethoxazole-Trimethoprim) 3)  ! * Red Dye 4)  ! * Plastic Tape  Past History:  Past Medical History: Reviewed history from 04/28/2010 and no changes required. CORONARY ARTERY DISEASE (ICD-414.00)      a.  S/P Acute Inf. MI 05/2002, with PCI/Express BMS to RCA.  OTW nonobs dzs and  NL EF.      b.  11/2003 - cath - Nonobs dzs.      c..  08/2007 - Adenosine MV: EF 49% (visually appeared better); Breast attenuation w/o ischemia. PAROXYSMAL ATRIAL FIBRILLATION (ICD-427.31) MYOCARDIAL INFARCTION, HX OF (ICD-412) HYPERTENSION (ICD-401.9) HYPERLIPIDEMIA (ICD-272.4) DIASTOLIC CONGESTIVE HEART FAILURE      a.  06/2008, Echo:  ef 60-70%. 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   She had a right AV fistula.  She has had ,  nasal septal surgery, tonsillectomy, colonoscopy.  Hysterectomy Oophorectomy Tubal ligation Nephrectomy 2010 no Ca on bx     Social History: Reviewed history from 01/14/2009 and no changes required. Retired Married x 32 y 2010 Patient is a former smoker.  Alcohol Use - no Illicit Drug Use - no Daily Caffeine Use 1 cup coffee  Review of Systems       Some fatigue but no fevers or chills, productive cough, hemoptysis, dysphasia, odynophagia, melena, hematochezia, dysuria, hematuria, rash, seizure activity, orthopnea, PND, pedal edema, claudication. Remaining systems are negative.   Vital Signs:  Patient profile:   65 year old female Height:      61.5 inches Weight:      164.50 pounds BMI:     30.69 Pulse rate:   69 / minute Pulse rhythm:   irregular Resp:     18 per minute BP sitting:   160 / 84  (right arm) Cuff size:   large  Vitals Entered By: [name] (June 16, 2010 11:11 AM)  Physical Exam  General:  Well-developed well-nourished in no acute distress.  Skin is warm and dry.  HEENT is normal.  Neck is supple. No thyromegaly.  Chest is clear to auscultation  with normal expansion.  Cardiovascular exam is regular rate and rhythm. 2/6 systolic murmur left sternal border. Abdominal exam nontender or distended. No masses palpated. Extremities show no edema. AV fistula left upper extremity. neuro grossly intact    EKG  Procedure date:  06/16/2010  Findings:      Sinus rhythm at a rate of 69. Occasional PACs. Axis normal. Left ventricular hypertrophy. Cannot rule out prior septal infarct.  Impression & Recommendations:  Problem # 1:  PERICARDIAL EFFUSION (ICD-423.9) Patient continues to have a moderate pericardial effusion. She is not symptomatic from this. The etiology is unclear but she is a dialysis patient. We will plan to repeat her echocardiogram in 6 months. There is no evidence clinically of tamponade. Her updated medication list for this problem includes:    Diovan 160 Mg Tabs (Valsartan) .Marland Kitchen... Take 1 tab in am and 1 in pm  Problem # 2:  RENAL FAILURE-CHRONIC (ICD-585.9) Management per nephrology.  Problem # 3:  CAROTID STENOSIS (ICD-433.10) Continue statin. Followup carotid Dopplers Augustl 2013. Her updated medication list for this problem includes:  Coumadin 5 Mg Tabs (Warfarin sodium) .Marland Kitchen... Tues and thurs 1 tab the rest of the week 1 1/2 tab once daily  Problem # 4:  CORONARY ARTERY DISEASE (ICD-414.00) Continue beta blocker and statin. Not on aspirin as she is on Coumadin. Her updated medication list for this problem includes:    Labetalol Hcl 100 Mg Tabs (Labetalol hcl) .Marland Kitchen... 2 tabs every day    Amlodipine Besylate 10 Mg Tabs (Amlodipine besylate) .Marland Kitchen... Take 1 by mouth qd    Coumadin 5 Mg Tabs (Warfarin sodium) .Marland Kitchen... Tues and thurs 1 tab the rest of the week 1 1/2 tab once daily  Problem # 5:  PAROXYSMAL ATRIAL FIBRILLATION (ICD-427.31) Continue Coumadin. She will need lifelong given history of CVA. Her updated medication list for this problem includes:    Labetalol Hcl 100 Mg Tabs (Labetalol hcl) .Marland Kitchen... 2 tabs every  day    Coumadin 5 Mg Tabs (Warfarin sodium) .Marland Kitchen... Tues and thurs 1 tab the rest of the week 1 1/2 tab once daily  Problem # 6:  HYPERTENSION (ICD-401.9) Blood pressure mildly elevated but she tends to be hypotensive on dialysis days. Continue present medications. Her updated medication list for this problem includes:    Labetalol Hcl 100 Mg Tabs (Labetalol hcl) .Marland Kitchen... 2 tabs every day    Amlodipine Besylate 10 Mg Tabs (Amlodipine besylate) .Marland Kitchen... Take 1 by mouth qd    Diovan 160 Mg Tabs (Valsartan) .Marland Kitchen... Take 1 tab in am and 1 in pm  Problem # 7:  HYPERLIPIDEMIA (ICD-272.4) Continue statin. Her updated medication list for this problem includes:    Pravachol 40 Mg Tabs (Pravastatin sodium) .Marland Kitchen... 1 by mouth qd  Problem # 8:  DIABETES MELLITUS, TYPE II (ICD-250.00)  Her updated medication list for this problem includes:    Diovan 160 Mg Tabs (Valsartan) .Marland Kitchen... Take 1 tab in am and 1 in pm  Other Orders: EKG w/ Interpretation (93[address])  Patient Instructions: 1)  Your physician recommends that you schedule a follow-up appointment in: 6 months with dr Ruffin Frederick 2)  Your physician recommends that you continue on your current medications as directed. Please refer to the Current Medication list given to you today.

## 2010-08-18 NOTE — Progress Notes (Signed)
   Pt.Signed ROI, i faxed over to [facility] on records [facility]  October 14, 2009 11:09 AM    Appended Document:  Refaxed ROI over to [name] today and left Message with MR Dept   Appended Document:  Recieved Records back from [facility] sent to [facility]

## 2010-08-18 NOTE — Consult Note (Signed)
Summary: [facility]  [facility]   Imported By: Rise Patience 02/24/2010 11:00:25  _____________________________________________________________________  External Attachment:    Type:   Image     Comment:   External Document

## 2010-08-18 NOTE — Letter (Signed)
Summary: [facility]  [facility]   Imported By: Rise Patience 06/03/2010 14:57:26  _____________________________________________________________________  External Attachment:    Type:   Image     Comment:   External Document

## 2010-08-18 NOTE — Letter (Signed)
Summary: [facility]  [facility]   Imported By: Bubba Hales 04/22/2010 08:58:54  _____________________________________________________________________  External Attachment:    Type:   Image     Comment:   External Document

## 2010-08-18 NOTE — Medication Information (Signed)
Summary: Application for Lovenox/[employer]  Application for Lovenox/[employer]   Imported By: Phillis Knack 12/02/2009 07:38:32  _____________________________________________________________________  External Attachment:    Type:   Image     Comment:   External Document

## 2010-08-18 NOTE — Letter (Signed)
Summary: Generic Letter  [name]   [place], [place] [address]   Phone: [phone]  Fax: [phone]    12/03/2009  [name] [address] [place], [place]  [address]  Dear Ms. Leger,  After speaking with your dentist and Dr Alain Marion we would like you to follow the instructions below.   You will need to schedule two seperate appointments for deep cleaning of your teeth. Follow the instructions below for those two appointments.  I have sent this information to the [facility] and your dentist.  STOP COUMDIN 4 Days before deep teeth cleaning appointment Check bloodwork once daily When your blood levels is less than 1.7 START Lovenox 80mg  subcutaneously once daily You should be able to have blood work at the dialysis center DO NOT TAKE Lovenox on the day of deep teeth cleaning Next day following deep teeth cleaning restart coumadin and lovenox STOP Lovenox when blood level is greater than 2.0 and continue coumadin  You do not need to stop coumadin before your crown or dental fillings. Please call our office with any questions. We have sent a copy of this letter to your dentist for review. Once you have confirmed with dialysis that they can help with your blood work contact the dentist to schedule your appointments.   Please call our office with any questions.    Sincerely,   Charlsie Quest, CMA (AAMA) For Dr A. Plotnikov  Appended Document: Generic Letter Mailed to patient

## 2010-08-18 NOTE — Progress Notes (Signed)
Summary: Dental Cleaning- Coumadin ?//AVP pt  Phone Note Other Incoming   Caller: [facility] Caryl Pina   [phone] Summary of Call: pt has dental cleaning appt today at 2 pm.  Pt informed hygenist that she only had to hold coumadin for 2 days prior but the letter they have from our office states 4 days......Marland KitchenIs 2 days acceptable?? Initial call taken by: Jonathon Resides, [facility]),  May 05, 2010 10:09 AM  Follow-up for Phone Call        2 days should be sufficient for cleaning of the teeth Follow-up by: Neena Rhymes MD,  May 05, 2010 1:10 PM  Additional Follow-up for Phone Call Additional follow up Details #1::        [facility] informed of above. Additional Follow-up by: Jonathon Resides, [facility]),  May 05, 2010 1:54 PM

## 2010-08-18 NOTE — Procedures (Signed)
Summary: Colonoscopy  Patient: [name] Note: All result statuses are Final unless otherwise noted.  Tests: (1) Colonoscopy (COL)   COL Colonoscopy           [facility] [employer].     [place], [place]  [address]           COLONOSCOPY PROCEDURE REPORT           PATIENT:  [name], [name]  MR#:  [id number]     BIRTHDATE:  1945-11-29, 64 yrs. old  GENDER:  female     ENDOSCOPIST:  Docia Chuck. Geri Seminole, MD     REF. BY:  Surveillance Program Recall,     PROCEDURE DATE:  06/09/2010     PROCEDURE:  Colonoscopy with snare polypectomy x 2     ASA CLASS:  Class III     INDICATIONS:  history of pre-cancerous (adenomatous) colon polyps,     surveillance and high-risk screening ; index exam 2000 w/     adenomas; f/u 2003, 2006     MEDICATIONS:   Fentanyl 75 mcg IV, Versed 8 mg IV           DESCRIPTION OF PROCEDURE:   After the risks benefits and     alternatives of the procedure were thoroughly explained, informed     consent was obtained.  Digital rectal exam was performed and     revealed no abnormalities.   The LB [id number] endoscope     was introduced through the anus and advanced to the cecum, which     was identified by both the appendix and ileocecal valve, without     limitations.Time to cecum = 5:00 min.  The quality of the prep was     excellent, using MoviPrep.  The instrument was then slowly     withdrawn (time = 10:08 min) as the colon was fully examined.     <<PROCEDUREIMAGES>>           FINDINGS:  Two polyps were found in the cecum (75mm) and sigmoid     colon (36mm). Polyps were snared without cautery. Retrieval was     successful.   Moderate diverticulosis was found in the left colon.     Retroflexed views in the rectum revealed internal hemorrhoids.     The scope was then withdrawn from the patient and the procedure     completed.           COMPLICATIONS:  None     ENDOSCOPIC IMPRESSION:     1) Two polyps - removed     2) Moderate  diverticulosis in the left colon     3) Internal hemorrhoids           RECOMMENDATIONS:     1) Follow up colonoscopy in 5 years           ______________________________     Docia Chuck. Geri Seminole, MD           CC:  Altamese Hooven. Plotnikov, MD;Michael Mercy Moore, MD;The Patient           n.     eSIGNED:   Docia Chuck. Geri Seminole at 06/09/2010 01:13 PM           Noyes, Tamela Oddi, [id number]  Note: An exclamation mark (!) indicates a result that was not dispersed into the flowsheet. Document Creation Date: 06/09/2010 1:13 PM _______________________________________________________________________  (1) Order result status: Final Collection or  observation date-time: 06/09/2010 13:04 Requested date-time:  Receipt date-time:  Reported date-time:  Referring Physician:   Ordering Physician: Lavena Bullion [id number]) Specimen Source:  Source: Tawanna Cooler Order Number: [id number] Lab site:   Appended Document: Colonoscopy recall 5 yrs     Procedures Next Due Date:    Colonoscopy: 05/2015

## 2010-08-18 NOTE — Assessment & Plan Note (Signed)
Summary: 2 MO ROV /NWS   Vital Signs:  Patient profile:   65 year old female Height:      61.5 inches Weight:      161.50 pounds BMI:     30.13 O2 Sat:      95 % on Room air Temp:     98.7 degrees F oral Pulse rate:   70 / minute BP sitting:   152 / 100  (left arm) Cuff size:   regular  Vitals Entered ByShirlean Mylar Ewing (Dec 09, 2009 2:57 PM)  O2 Flow:  Room air CC: 2 month ROV/RE   Primary Care Provider:  Walker Kehr, MD  CC:  2 month ROV/RE.  History of Present Illness: The patient presents for a follow up of hypertension, diabetes, hyperlipidemia, CRF, itching. C/o loosing hair. She says that she started to itch after starting to take Sensipar  Current Medications (verified): 1)  Labetalol Hcl 100 Mg  Tabs (Labetalol Hcl) .... 2 Tabs Every Day 2)  Colchicine 0.6 Mg  Tabs (Colchicine) .... Take 1 By Mouth Two Times A Day As Needed 3)  Amlodipine Besylate 10 Mg  Tabs (Amlodipine Besylate) .... Take 1 By Mouth Qd 4)  Pravachol 40 Mg Tabs (Pravastatin Sodium) .Marland Kitchen.. 1 By Mouth Qd 5)  Coumadin 5 Mg Tabs (Warfarin Sodium) .... Tues and Thurs 1 Tab The Rest of The Week 1 1/2 Tab Once Daily 6)  Nitrolingual Duo Pack 0.4 Mg/spray Soln (Nitroglycerin) .... Use Sl As Needed As Dirr 7)  Diovan 160 Mg Tabs (Valsartan) .... Take 1 Tab in Am and 1 in Pm 8)  Miralax   Powd (Polyethylene Glycol 3350) .Marland Kitchen.. 17g By Mouth Once Daily As Needed Constipation 9)  Epogen 4000 Unit/ml Soln (Epoetin Alfa) .... As Directed Dialysis 10)  Renvela 800 Mg Tabs (Sevelamer Carbonate) .... Take 1 Tablet By Mouth Three Times A Day 11)  Vitamin C Cr 500 Mg Cr-Caps (Ascorbic Acid) .Marland Kitchen.. 1 Po Qd 12)  Triamcinolone Acetonide 0.5 % Crea (Triamcinolone Acetonide) .... As Needed 13)  Lidocaine-Prilocaine 2.5-2.5 % Crea (Lidocaine-Prilocaine) .Marland Kitchen.. 1-2 Hours Before Procedure 14)  Renal  Tabs (Multiple Vitamins-Minerals) .... Once Daily 15)  Onetouch Ultrasoft Lancets  Misc (Lancets) .... Test Two Times A Day As  Needed 16)  Onetouch Ultra Test  Strp (Glucose Blood) .... Test Two Times A Day As Needed 17)  Sensipar 30 Mg Tabs (Cinacalcet Hcl) .Marland Kitchen.. 1 Tab By Mouth Once Daily 18)  Keppra 250 Mg Tabs (Levetiracetam) .Marland Kitchen.. 1 By Mouth Two Times A Day 19)  Zyrtec Hives Relief 10 Mg Tabs (Cetirizine Hcl) .... Two Times A Day 20)  Protonix 40 Mg Tbec (Pantoprazole Sodium) .... Once Daily 21)  Sustenex-Probiotic .... Once Daily 22)  Hydroxyzine Hcl 25 Mg Tabs (Hydroxyzine Hcl) .Marland Kitchen.. 1-2 Tabs By Mouth Two Times A Day As Needed Itching 23)  Triamcinolone 0.5 % Cream in Eucerin Lotion 1:5 .... Use Bid 24)  Hydroxyzine Hcl 25 Mg Tabs (Hydroxyzine Hcl) .Marland Kitchen.. 1-2 Tabs By Mouth Two Times A Day As Needed Itching 25)  Prednisone 10 Mg Tabs (Prednisone) .... Take 40mg  Qd For 3 Days, Then 20 Mg Qd For 3 Days, Then 10mg  Qd For 6 Days, Then Stop. Take Pc. 26)  Lovenox 80 Mg/0.30ml Soln (Enoxaparin Sodium) .... As Dirrected Once Daily Sq  Allergies (verified): 1)  ! Sulfa 2)  ! Bactrim Ds (Sulfamethoxazole-Trimethoprim)  Past History:  Past Medical History: Last updated: 10/14/2009 CORONARY ARTERY DISEASE (ICD-414.00)  a.  S/P Acute Inf. MI 05/2002, with PCI/Express BMS to RCA.  OTW nonobs dzs and NL EF.      b.  11/2003 - cath - Nonobs dzs.      c..  08/2007 - Adenosine MV: EF 49% (visually appeared better); Breast attenuation w/o ischemia. PAROXYSMAL ATRIAL FIBRILLATION (ICD-427.31) MYOCARDIAL INFARCTION, HX OF (ICD-412) HYPERTENSION (ICD-401.9) HYPERLIPIDEMIA (ICD-272.4) DIASTOLIC CONGESTIVE HEART FAILURE      a.  06/2008, Echo:  ef 60-70%. 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   She had a right AV fistula.  She has had ,   nasal septal surgery, tonsillectomy, colonoscopy.  Hysterectomy Oophorectomy Tubal ligation Nephrectomy 2010 no Ca on bx     Social History: Last updated: 01/14/2009 Retired Married x 50 y 2010 Patient is a former smoker.  Alcohol Use - no Illicit Drug Use - no Daily Caffeine Use 1 cup coffee  Review of Systems       The patient complains of dyspnea on exertion, muscle weakness, difficulty walking, and depression.  The patient denies fever, chest pain, and abdominal pain.         itching and rash are constant  Physical Exam  General:  Chronically ill-appearing - looks better NAD Head:  Normocephalic and atraumatic without obvious abnormalities. No apparent alopecia or balding. Eyes:  No corneal or conjunctival inflammation noted. EOMI. Perrla. Ears:  External ear exam shows no significant lesions or deformities.  Otoscopic examination reveals clear canals, tympanic membranes are intact bilaterally without bulging, retraction, inflammation or discharge. Hearing is grossly normal bilaterally. Nose:  External nasal examination shows no deformity or inflammation. Nasal mucosa are pink and moist without lesions or exudates. Mouth:  Oral mucosa and oropharynx without lesions or exudates.  Teeth in good repair. Neck:  No deformities,  masses, or tenderness noted. Lungs:  Clear bilaterally to auscultation and percussion. Heart:  irreg, irreg s1/s2 no s3/s4/murmurs. Abdomen:  round, soft, nt/nd/bs+x4. Msk:  Lumbar-sacral spine is tender to palpation over paraspinal muscles and painfull with the ROM  Extremities:  No edema B Neurologic:  Alert and oriented x 3. Skin:  Nodules and papules scattered Alopecia on L scalp Excoriations and erythematous rashes Psych:  depressed affect.     Impression & Recommendations:  Problem # 1:  ALOPECIA (ICD-704.00) Assessment New See #2  Problem # 2:  SKIN RASH (W8805310.1) Assessment: Unchanged Hold Sensipar Her updated medication list for this problem includes:    Triamcinolone Acetonide 0.5 % Crea (Triamcinolone acetonide) .Marland Kitchen... As needed  Problem # 3:  PRURITUS (ICD-698.9) Assessment: Unchanged As per #2  Problem # 4:  CORONARY ARTERY DISEASE (ICD-414.00) Assessment: Unchanged  Her updated medication list for this problem includes:    Labetalol Hcl 100 Mg Tabs (Labetalol hcl) .Marland KitchenMarland KitchenMarland KitchenMarland Kitchen 2  tabs every day    Amlodipine Besylate 10 Mg Tabs (Amlodipine besylate) .Marland Kitchen... Take 1 by mouth qd    Nitrolingual Duo Pack 0.4 Mg/spray Soln (Nitroglycerin) ..... Use sl as needed as dirr    Diovan 160 Mg Tabs (Valsartan) .Marland Kitchen... Take 1 tab in am and 1 in pm  Problem # 5:  CONGESTIVE HEART FAILURE (ICD-428.0) Assessment: Unchanged  Her updated medication list for this problem includes:    Labetalol Hcl 100 Mg Tabs (Labetalol hcl) .Marland Kitchen... 2 tabs every day    Coumadin 5 Mg Tabs (Warfarin sodium) .Marland Kitchen... Tues and thurs 1 tab the rest of the week 1 1/2 tab once daily    Diovan 160 Mg Tabs (Valsartan) .Marland Kitchen... 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 Tues and thurs 1 tab the rest of the week 1 1/2 tab once daily  Problem #  7:  ANTICOAGULATION THERAPY (ICD-V58.61) Assessment: Comment Only See "Patient Instructions".  Lovenox shippment is handed to the pt  Problem # 8:  DIABETES MELLITUS, TYPE II (ICD-250.00) Assessment: Unchanged  Her updated medication list for this problem includes:    Diovan 160 Mg Tabs (Valsartan) .Marland Kitchen... Take 1 tab in am and 1 in pm  Labs Reviewed: Creat: 5.1 (10/02/2009)    Reviewed HgBA1c results: 5.7 (10/02/2009)  5.2 (06/19/2009)  Problem # 9:  END STAGE RENAL DISEASE (ICD-585.6) Assessment: Unchanged On HD  Complete Medication List: 1)  Labetalol Hcl 100 Mg Tabs (Labetalol hcl) .... 2 tabs every day 2)  Colchicine 0.6 Mg Tabs (Colchicine) .... Take 1 by mouth two times a day as needed 3)  Amlodipine Besylate 10 Mg Tabs (Amlodipine besylate) .... Take 1 by mouth qd 4)  Pravachol 40 Mg Tabs (Pravastatin sodium) .Marland Kitchen.. 1 by mouth qd 5)  Coumadin 5 Mg Tabs (Warfarin sodium) .... Tues and thurs 1 tab the rest of the week 1 1/2 tab once daily 6)  Nitrolingual Duo Pack 0.4 Mg/spray Soln (Nitroglycerin) .... Use sl as needed as dirr 7)  Diovan 160 Mg Tabs (Valsartan) .... Take 1 tab in am and 1 in pm 8)  Miralax Powd (Polyethylene glycol 3350) .Marland Kitchen.. 17g by mouth once daily as needed constipation 9)  Epogen 4000 Unit/ml Soln (Epoetin alfa) .... As directed dialysis 10)  Renvela 800 Mg Tabs (Sevelamer carbonate) .... Take 1 tablet by mouth three times a day 11)  Vitamin C Cr 500 Mg Cr-caps (Ascorbic acid) .Marland Kitchen.. 1 po qd 12)  Triamcinolone Acetonide 0.5 % Crea (Triamcinolone acetonide) .... As needed 13)  Lidocaine-prilocaine 2.5-2.5 % Crea (Lidocaine-prilocaine) .Marland Kitchen.. 1-2 hours before procedure 14)  Renal Tabs (Multiple vitamins-minerals) .... Once daily 15)  Onetouch Ultrasoft Lancets Misc (Lancets) .... Test two times a day as needed 16)  Onetouch Ultra Test Strp (Glucose blood) .... Test two times a day as needed 17)  Sensipar 30 Mg Tabs (Cinacalcet hcl) .Marland Kitchen.. 1 tab by mouth once  daily 18)  Keppra 250 Mg Tabs (Levetiracetam) .Marland Kitchen.. 1 by mouth two times a day 19)  Zyrtec Hives Relief 10 Mg Tabs (Cetirizine hcl) .... Two times a day 20)  Protonix 40 Mg Tbec (Pantoprazole sodium) .... Once daily 21)  Sustenex-probiotic  .... Once daily 22)  Hydroxyzine Hcl 25 Mg Tabs (Hydroxyzine hcl) .Marland Kitchen.. 1-2 tabs by mouth two times a day as  needed itching 23)  Triamcinolone 0.5 % Cream in Eucerin Lotion 1:5  .... Use bid 24)  Hydroxyzine Hcl 25 Mg Tabs (Hydroxyzine hcl) .Marland Kitchen.. 1-2 tabs by mouth two times a day as needed itching 25)  Prednisone 10 Mg Tabs (Prednisone) .... Take 40mg  qd for 3 days, then 20 mg qd for 3 days, then 10mg  qd for 6 days, then stop. take pc. 26)  Lovenox 80 Mg/0.59ml Soln (Enoxaparin sodium) .... As dirrected once daily sq  Patient Instructions: 1)  Please schedule a follow-up appointment in 3 months. 2)  Hold Sensipar x 1 week to see if itching is better 3)  LOVENOX and COUMADIN: 4)  Stop Coumadin 4 days prior to procedure. 5)  Start Lovenox shots 80 mg next day 6)  Hold lovenox for the day of your procedure 7)  Restart Lovenox and Coumadin next day. 8)  Check INR at Dialysis 9)  Stop Lovenox when INR is >1.7 or higher and cont. with Coumadin

## 2010-08-18 NOTE — Assessment & Plan Note (Signed)
Summary: F4M/DM  Medications Added LABETALOL HCL 100 MG  TABS (LABETALOL HCL) 2 tabs every day COLCHICINE 0.6 MG  TABS (COLCHICINE) TAKE 1 by mouth two times a day as needed KEPPRA 250 MG TABS (LEVETIRACETAM) 1 by mouth two times a day        Primary Provider:  Walker Kehr, MD   History of Present Illness: [name] is a pleasant  female, who has a history of coronary artery disease, status post PCI of the right coronary artery. Her last Myoview was performed on August 29, 2007.  Her ejection fraction was calculated at 49%, but visually appeared better.  There was breast attenuation, but no ischemia. The patient apparently was admitted in December of [id number] with an embolic CVA. An echocardiogram showed normal LV function and a small pericardial effusion. She subsequently had a TEE which revealed normal LV function and a moderate effusion. There were no vegetations. She also apparently was placed on Coumadin with a diagnosis of paroxysmal atrial fibrillation. Carotid Dopplers performed in October of 2010 at [facility] showed 20-39% bilateral stenosis.  I last saw her in August 2010. Since then she denies any dyspnea, chest pain, palpitations or syncope and there is no pedal edema. There is been no bleeding. Note she had a dobutamine echocardiogram performed at [facility] in December of 2010 but I do not have those records available. It apparently was normal.  Current Medications (verified): 1)  Labetalol Hcl 100 Mg  Tabs (Labetalol Hcl) .... 2 Tabs Every Day 2)  Colchicine 0.6 Mg  Tabs (Colchicine) .... Take 1 By Mouth Two Times A Day As Needed 3)  Amlodipine Besylate 10 Mg  Tabs (Amlodipine Besylate) .... Take 1 By Mouth Qd 4)  Pravachol 40 Mg Tabs (Pravastatin Sodium) .Marland Kitchen.. 1 By Mouth Qd 5)  Coumadin 5 Mg Tabs (Warfarin Sodium) .... Tues and Thurs 1 Tab The Rest of The Week 1 1/2 Tab Once Daily 6)  Nitrolingual Duo Pack 0.4 Mg/spray Soln (Nitroglycerin) .... Use Sl As Needed As Dirr 7)   Diovan 160 Mg Tabs (Valsartan) .... Take 1 Tab in Am and 1 in Pm 8)  Miralax   Powd (Polyethylene Glycol 3350) .Marland Kitchen.. 17g By Mouth Once Daily As Needed Constipation 9)  Epogen 4000 Unit/ml Soln (Epoetin Alfa) .... As Directed Dialysis 10)  Renvela 800 Mg Tabs (Sevelamer Carbonate) .... Take 1 Tablet By Mouth Three Times A Day 11)  Vitamin C Cr 500 Mg Cr-Caps (Ascorbic Acid) .Marland Kitchen.. 1 Po Qd 12)  Triamcinolone Acetonide 0.5 % Crea (Triamcinolone Acetonide) .... As Needed 13)  Lidocaine-Prilocaine 2.5-2.5 % Crea (Lidocaine-Prilocaine) .Marland Kitchen.. 1-2 Hours Before Procedure 14)  Renal  Tabs (Multiple Vitamins-Minerals) .... Once Daily 15)  Onetouch Ultrasoft Lancets  Misc (Lancets) .... Test Two Times A Day As Needed 16)  Onetouch Ultra Test  Strp (Glucose Blood) .... Test Two Times A Day As Needed 17)  Sensipar 30 Mg Tabs (Cinacalcet Hcl) .Marland Kitchen.. 1 Tab By Mouth Once Daily 18)  Keppra 250 Mg Tabs (Levetiracetam) .Marland Kitchen.. 1 By Mouth Two Times A Day 19)  Zyrtec Hives Relief 10 Mg Tabs (Cetirizine Hcl) .... Two Times A Day 20)  Protonix 40 Mg Tbec (Pantoprazole Sodium) .... Once Daily 21)  Sustenex-Probiotic .... Once Daily 22)  Hydroxyzine Hcl 25 Mg Tabs (Hydroxyzine Hcl) .Marland Kitchen.. 1-2 Tabs By Mouth Two Times A Day As Needed Itching 23)  Triamcinolone 0.5 % Cream in Eucerin Lotion 1:5 .... Use Bid 24)  Hydroxyzine Hcl 25 Mg Tabs (Hydroxyzine Hcl) .Marland KitchenMarland KitchenMarland Kitchen  1-2 Tabs By Mouth Two Times A Day As Needed Itching 25)  Ketoconazole 2 % Crea (Ketoconazole) .... Use Two Times A Day On Scalp 26)  Prednisone 10 Mg Tabs (Prednisone) .... Take 40mg  Qd For 3 Days, Then 20 Mg Qd For 3 Days, Then 10mg  Qd For 6 Days, Then Stop. Take Pc.  Allergies: 1)  ! Sulfa 2)  ! Bactrim Ds (Sulfamethoxazole-Trimethoprim)  Past History:  Past Medical History: CORONARY ARTERY DISEASE (ICD-414.00)      a.  S/P Acute Inf. MI 05/2002, with PCI/Express BMS to RCA.  OTW nonobs dzs and NL EF.      b.  11/2003 - cath - Nonobs dzs.      c..  08/2007 - Adenosine  MV: EF 49% (visually appeared better); Breast attenuation w/o ischemia. PAROXYSMAL ATRIAL FIBRILLATION (ICD-427.31) MYOCARDIAL INFARCTION, HX OF (ICD-412) HYPERTENSION (ICD-401.9) HYPERLIPIDEMIA (ICD-272.4) DIASTOLIC CONGESTIVE HEART FAILURE      a.  06/2008, Echo:  ef 60-70%. 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   She had a right AV fistula.  She has had ,   nasal septal surgery, tonsillectomy, colonoscopy.  Hysterectomy Oophorectomy Tubal ligation Nephrectomy 2010 no Ca on bx     Social History: Reviewed history from 01/14/2009 and no changes required. Retired Married x 43 y 2010 Patient is a former smoker.  Alcohol Use - no Illicit Drug Use - no Daily Caffeine Use 1 cup coffee  Review of Systems       no fevers or chills, productive cough, hemoptysis, dysphasia, odynophagia, melena, hematochezia, dysuria, hematuria, rash, seizure activity, orthopnea, PND, pedal edema, claudication. Remaining systems are negative.   Vital Signs:  Patient profile:   65 year old  female Height:      64 inches Weight:      164 pounds BMI:     28.25 Pulse rate:   77 / minute Resp:     12 per minute BP sitting:   153 / 77  (left arm)  Vitals Entered By: Burnett Kanaris (October 14, 2009 10:14 AM)  O2 Flow:  Room air  Physical Exam  General:  Well-developed well-nourished in no acute distress.  Skin is warm and dry.  HEENT is normal.  Neck is supple. No thyromegaly.  Chest is clear to auscultation with normal expansion.  Cardiovascular exam is regular rate and rhythm.  Abdominal exam nontender or distended. No masses palpated. Extremities show no edema. neuro grossly intact    EKG  Procedure date:  10/14/2009  Findings:      Sinus rhythm at a rate of 67. Axis normal. No ST changes.  Impression & Recommendations:  Problem # 1:  CORONARY ARTERY DISEASE (ICD-414.00) Continue beta blocker and statin. She states nephrology stopped her aspirin as there was bleeding with dialysis. Her  updated medication list for this problem includes:    Labetalol Hcl 100 Mg Tabs (Labetalol hcl) .Marland Kitchen... 2 tabs every day    Amlodipine Besylate 10 Mg Tabs (Amlodipine besylate) .Marland Kitchen... Take 1 by mouth qd    Coumadin 5 Mg Tabs (Warfarin sodium) .Marland Kitchen... Tues and thurs 1 tab the rest of the week 1 1/2 tab once daily    Nitrolingual Duo Pack 0.4 Mg/spray Soln (Nitroglycerin) ..... Use sl as needed as dirr  Orders: EKG w/ Interpretation (93000)  Problem # 2:  CAROTID STENOSIS (ICD-433.10) Continue statin. Followup carotid Dopplers October 2011. Her updated medication list for this problem includes:    Coumadin 5 Mg Tabs (Warfarin sodium) .Marland Kitchen... Tues and thurs 1 tab the rest of the week 1 1/2 tab once daily  Problem # 3:  PAROXYSMAL ATRIAL FIBRILLATION (ICD-427.31) Continue beta blocker as well as Coumadin. She will need lifelong Coumadin given history of embolic CVA. Her updated medication list for this problem includes:    Labetalol Hcl 100 Mg Tabs (Labetalol hcl) .Marland Kitchen... 2 tabs  every day    Coumadin 5 Mg Tabs (Warfarin sodium) .Marland Kitchen... Tues and thurs 1 tab the rest of the week 1 1/2 tab once daily  Orders: EKG w/ Interpretation (93000)  Problem # 4:  HYPERTENSION (ICD-401.9) Blood pressure mildly elevated. We will follow this and increase medications as needed. Her updated medication list for this problem includes:    Labetalol Hcl 100 Mg Tabs (Labetalol hcl) .Marland Kitchen... 2 tabs every day    Amlodipine Besylate 10 Mg Tabs (Amlodipine besylate) .Marland Kitchen... Take 1 by mouth qd    Diovan 160 Mg Tabs (Valsartan) .Marland Kitchen... Take 1 tab in am and 1 in pm  Problem # 5:  HYPERLIPIDEMIA (ICD-272.4) Continue statin. Lipids and liver monitored by primary care. Her updated medication list for this problem includes:    Pravachol 40 Mg Tabs (Pravastatin sodium) .Marland Kitchen... 1 by mouth qd  Problem # 6:  RENAL FAILURE (ICD-586) Management per nephrology.  Problem # 7:  PERICARDIAL EFFUSION (ICD-423.9) We will obtain the recent dobutamine echocardiogram from [facility]. Her updated medication list for this problem includes:    Diovan 160 Mg Tabs (Valsartan) .Marland Kitchen... Take 1 tab in am and 1 in pm    Prednisone 10 Mg Tabs (Prednisone) .Marland Kitchen... Take 40mg  qd for 3 days, then 20 mg qd for 3 days, then 10mg  qd for 6 days, then stop. take pc.  Problem # 8:  ANTICOAGULATION THERAPY (ICD-V58.61)  Problem # 9:  PREOPERATIVE EXAMINATION (ICD-V72.84) Patient is scheduled to have renal transplant. I will have her recent dobutamine echo forwarded to [place] for our records. If normal no further cardiac workup necessary preoperatively.  Problem # 10:  DIABETES MELLITUS, TYPE II (ICD-250.00)  Her updated medication list for this problem includes:    Diovan 160 Mg Tabs (Valsartan) .Marland Kitchen... Take 1 tab in am and 1 in pm  Patient Instructions: 1)  Your physician recommends that you schedule a follow-up appointment in: 6 months.

## 2010-08-18 NOTE — Progress Notes (Signed)
  Phone Note Other Incoming   Request: Send information Summary of Call: Request for records received from [facility]. Request forwarded to [employer] for copying of patients complete medical record.

## 2010-08-18 NOTE — Assessment & Plan Note (Signed)
Summary: 3 MO ROV/NWS  #   Vital Signs:  Patient profile:   65 year old female Height:      61.5 inches Weight:      163 pounds BMI:     30.41 Temp:     99.0 degrees F oral Pulse rate:   80 / minute Pulse rhythm:   irregular Resp:     16 per minute BP sitting:   154 / 70  (right arm) Cuff size:   large  Vitals Entered By: Jonathon Resides, Gabrielle Dare) (June 16, 2010 2:31 PM) CC: 3 mo f/u c/o mucous in throat and "spitting" problem   Primary Care Provider:  Walker Kehr, MD  CC:  3 mo f/u c/o mucous in throat and "spitting" problem.  History of Present Illness: The patient presents for a follow up of hypertension, diabetes, hyperlipidemia, ESRD C/o more GERD symptoms - she has to spit throat mucus all the time - brown/yellow in color   Current Medications (verified): 1)  Labetalol Hcl 100 Mg  Tabs (Labetalol Hcl) .... 2 Tabs Every Day 2)  Amlodipine Besylate 10 Mg  Tabs (Amlodipine Besylate) .... Take 1 By Mouth Qd 3)  Pravachol 40 Mg Tabs (Pravastatin Sodium) .Marland Kitchen.. 1 By Mouth Qd 4)  Coumadin 5 Mg Tabs (Warfarin Sodium) .... Tues and Thurs 1 Tab The Rest of The Week 1 1/2 Tab Once Daily 5)  Diovan 160 Mg Tabs (Valsartan) .... Take 1 Tab in Am and 1 in Pm 6)  Epogen 4000 Unit/ml Soln (Epoetin Alfa) .... As Directed Dialysis 7)  Renvela 800 Mg Tabs (Sevelamer Carbonate) .... Take 1 Tablet By Mouth Three Times A Day 8)  Vitamin C Cr 500 Mg Cr-Caps (Ascorbic Acid) .Marland Kitchen.. 1 Po Qd 9)  Lidocaine-Prilocaine 2.5-2.5 % Crea (Lidocaine-Prilocaine) .Marland Kitchen.. 1-2 Hours Before Procedure 10)  Renal  Tabs (Multiple Vitamins-Minerals) .... Once Daily 11)  Onetouch Ultrasoft Lancets  Misc (Lancets) .... Test Two Times A Day As Needed 12)  Onetouch Ultra Test  Strp (Glucose Blood) .... Test Two Times A Day As Needed 13)  Sensipar 30 Mg Tabs (Cinacalcet Hcl) .Marland Kitchen.. 1 Tab By Mouth Once Daily 14)  Keppra 250 Mg Tabs (Levetiracetam) .Marland Kitchen.. 1 By Mouth Two Times A Day 15)  Hydroxyzine Hcl 25 Mg Tabs  (Hydroxyzine Hcl) .Marland Kitchen.. 1-2 Tabs By Mouth Two Times A Day As Needed Itching 16)  Dexilant 60 Mg Cpdr (Dexlansoprazole) .Marland Kitchen.. 1 By Mouth Once Daily 17)  Nystatin 100000 Unit/ml Susp (Nystatin) .... Take 5 Cc Orally and Gargle and Swallow 18)  Flonase 50 Mcg/act Susp (Fluticasone Propionate) .... As Directed  Allergies (verified): 1)  ! Sulfa 2)  ! Bactrim Ds (Sulfamethoxazole-Trimethoprim) 3)  ! * Red Dye 4)  ! * Plastic Tape  Past History:  Social History: Last updated: 01/14/2009 Retired Married x 50 y 2010 Patient is a former smoker.  Alcohol Use - no Illicit Drug Use - no Daily Caffeine Use 1 cup coffee  Past Medical History: CORONARY ARTERY DISEASE (ICD-414.00)      a.  S/P Acute Inf. MI 05/2002, with PCI/Express BMS to RCA.  OTW nonobs dzs and NL EF.      b.  11/2003 - cath - Nonobs dzs.      c..  08/2007 - Adenosine MV: EF 49% (visually appeared better); Breast attenuation w/o ischemia. PAROXYSMAL ATRIAL FIBRILLATION (ICD-427.31) MYOCARDIAL INFARCTION, HX OF (ICD-412) HYPERTENSION (ICD-401.9) HYPERLIPIDEMIA (ICD-272.4) DIASTOLIC CONGESTIVE HEART FAILURE      a.  06/2008, Echo:  [id number]  60-70%. 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    Physical Exam  General:  Chronically ill-appearing - looks better NAD Spitting brown sputum in a cup all the time Head:  Normocephalic and atraumatic without obvious abnormalities. No apparent alopecia or balding. Nose:  External nasal examination shows no deformity or inflammation. Nasal mucosa are pink and moist without lesions or exudates. Mouth:  Oral mucosa and oropharynx without lesions or exudates.  Teeth in good repair. Neck:  No deformities, masses, or tenderness noted. Lungs:  Clear bilaterally to auscultation and percussion. Heart:  irreg, irreg s1/s2 no s3/s4/murmurs. Abdomen:  round, soft, nt/nd/bs+x4. Msk:  Lumbar-sacral spine is tender to palpation over paraspinal muscles and painfull with the ROM  Neurologic:  Alert and oriented x 3. Skin:  Nodules and papules scattered Alopecia on L scalp Excoriations and erythematous rashes Psych:  depressed affect.     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 Gastric emptying scan was nl She had an ENT eval and a Ba swaloow test Abx if not better EGD if not better Empiric Ceftin, Reglan,Zantac - see Rx  Problem # 2:  NAUSEA (ICD-787.02) Assessment: Unchanged  Problem # 3:  CORONARY ARTERY DISEASE (ICD-414.00) Assessment: Unchanged  Her updated medication list for this problem includes:    Labetalol Hcl 100 Mg Tabs (Labetalol hcl) .Marland Kitchen... 2 tabs every day    Amlodipine Besylate 10 Mg Tabs (Amlodipine besylate) .Marland Kitchen... Take 1 by mouth qd    Diovan 160 Mg Tabs (Valsartan) .Marland Kitchen... Take 1 tab in am and 1 in pm  Problem # 4:  RENAL FAILURE-CHRONIC (ICD-585.9) Assessment: Unchanged  Problem # 5:  CONGESTIVE HEART FAILURE  (ICD-428.0) Assessment: Improved  Her updated medication list for this problem includes:    Labetalol Hcl 100 Mg Tabs (Labetalol hcl) .Marland Kitchen... 2 tabs every day    Coumadin 5 Mg Tabs (Warfarin sodium) .Marland Kitchen... Tues and thurs 1 tab the rest of the week 1 1/2 tab once daily    Diovan 160 Mg Tabs (Valsartan) .Marland Kitchen... Take 1 tab in am and 1 in pm  Orders: T-2 View CXR, Same Day ([id number])  Complete Medication List: 1)  Labetalol Hcl 100 Mg Tabs (Labetalol hcl) .... 2 tabs every day 2)  Amlodipine Besylate 10 Mg Tabs (Amlodipine besylate) .... Take 1 by mouth qd 3)  Pravachol 40 Mg Tabs (Pravastatin sodium) .Marland Kitchen.. 1 by mouth qd 4)  Coumadin 5 Mg Tabs (Warfarin sodium) .... Tues and thurs 1 tab the rest of the week 1 1/2 tab once daily 5)  Diovan 160 Mg Tabs (Valsartan) .... Take 1 tab in am and 1 in pm 6)  Epogen 4000 Unit/ml Soln (Epoetin alfa) .... As directed dialysis 7)  Renvela 800 Mg Tabs (Sevelamer carbonate) .... Take 1 tablet by mouth three times a day 8)  Vitamin C Cr 500 Mg Cr-caps (Ascorbic acid) .Marland Kitchen.. 1 po qd 9)  Lidocaine-prilocaine 2.5-2.5 % Crea (Lidocaine-prilocaine) .Marland Kitchen.. 1-2 hours before procedure 10)  Renal Tabs (Multiple vitamins-minerals) .... Once daily 11)  Onetouch Ultrasoft Lancets Misc (Lancets) .... Test two times a day as needed 12)  Onetouch Ultra Test Strp (Glucose blood) .... Test two times a day as needed 13)  Sensipar 30 Mg Tabs (Cinacalcet hcl) .Marland Kitchen.. 1 tab by mouth once daily 14)  Keppra 250 Mg Tabs (Levetiracetam) .Marland Kitchen.. 1 by mouth two times a day 15)  Hydroxyzine Hcl 25 Mg Tabs (Hydroxyzine hcl) .Marland Kitchen.. 1-2 tabs by mouth two times a day as needed itching 16)  Nystatin 100000 Unit/ml Susp (Nystatin) .... Take 5 cc orally and gargle and swallow 17)  Flonase 50 Mcg/act Susp (Fluticasone propionate) .... As directed 18)  Ranitidine Hcl 150 Mg Tabs (Ranitidine hcl) .Marland Kitchen.. 1 by mouth two times a day for indigestion 19)  Metoclopramide Hcl 5 Mg Tabs (Metoclopramide hcl) .Marland Kitchen.. 1 three  times a day before meals 20)  Ceftin 500 Mg Tabs (Cefuroxime axetil) .Marland Kitchen.. 1 by mouth bid  Patient Instructions: 1)  Please schedule a follow-up appointment in 6 weeks. Prescriptions: CEFTIN 500 MG TABS (CEFUROXIME AXETIL) 1 by mouth bid  #20 x 1   Entered and Authorized by:   [name] MD   Signed by:   [name] MD on 06/16/2010   Method used:   Electronically to        [facility] # [phone]* (retail)       [place], [place]  [address]       Ph: [phone]       Fax: [phone]   RxID:   [id number] METOCLOPRAMIDE HCL 5 MG/ML SOLN (METOCLOPRAMIDE HCL) 1 by mouth three times a day ac  #90 x 6   Entered and Authorized by:   [name] MD   Signed by:   [name] MD on 06/16/2010   Method used:   Electronically to        [facility] # [phone]* (retail)       [place], [place]  [address]       Ph: [phone]       Fax: [phone]   RxID:   [id number] RANITIDINE HCL 150 MG TABS (RANITIDINE HCL) 1 by mouth two times a day for indigestion  #60 x 12   Entered and Authorized by:   [name] MD   Signed by:   [name] MD on 06/16/2010   Method used:   Electronically to        [facility] # [phone]* (retail)       [address] [place], [place]  [address]       Ph: [phone]       Fax: [phone]   RxID:   [id number]    Orders Added: 1)  T-2 View CXR, Same Day [71020.5TC] 2)  Est. Patient Level IV [id number]   Immunization History:  Influenza Immunization History:    Influenza:  historical (04/02/2010)   Immunization History:  Influenza Immunization History:    Influenza:  Historical (04/02/2010)

## 2010-08-18 NOTE — Letter (Signed)
Summary: [facility]   Imported By: Bubba Hales 07/22/2009 08:46:37  _____________________________________________________________________  External Attachment:    Type:   Image     Comment:   External Document

## 2010-08-18 NOTE — Progress Notes (Signed)
Summary: Called Dr Janace Hoard for Medical Records   Phone Note Outgoing Call   Call placed by: Sharol Roussel,  April 16, 2010 10:26 AM Call placed to: Specialist Summary of Call: Pt saw Dr Janace Hoard, Ii LM  for Medical Records to call me back.. Amy is asking for records. Initial call taken by: Sharol Roussel,  April 16, 2010 10:27 AM  Follow-up for Phone Call        Did receive medical records from Dr. Janace Hoard today. Follow-up by: Sharol Roussel,  April 16, 2010 11:51 AM

## 2010-08-18 NOTE — Letter (Signed)
Summary: [facility] Instructions  [facility]  [place], [place] [address]   Phone: [phone]  Fax: [phone]       [name]    22-Apr-1946    MRN: [id number]        Procedure Day /Date:TUESDAY, 06/09/10     Arrival Time:10:30 AM     Procedure Time:11:30 AM     Location of Procedure:                    X  [facility] (4th Floor)                        PREPARATION FOR COLONOSCOPY WITH MOVIPREP   Starting 5 days prior to your procedure 06/04/10 do not eat nuts, seeds, popcorn, corn, beans, peas,  salads, or any raw vegetables.  Do not take any fiber supplements (e.g. Metamucil, Citrucel, and Benefiber).  THE DAY BEFORE YOUR PROCEDURE         DATE: 06/08/10  [id number]  1.  Drink clear liquids the entire day-NO SOLID FOOD  2.  Do not drink anything colored red or purple.  Avoid juices with pulp.  No orange juice.  3.  Drink at least 64 oz. (8 glasses) of fluid/clear liquids during the day to prevent dehydration and help the prep work efficiently.  CLEAR LIQUIDS INCLUDE: Water Jello Ice Popsicles Tea (sugar ok, no milk/cream) Powdered fruit flavored drinks Coffee (sugar ok, no milk/cream) Gatorade Juice: apple, white grape, white cranberry  Lemonade Clear bullion, consomm, broth Carbonated beverages (any kind) Strained chicken noodle soup Hard Candy                             4.  In the morning, mix first dose of MoviPrep solution:    Empty 1 Pouch A and 1 Pouch B into the disposable container    Add lukewarm drinking water to the top line of the container. Mix to dissolve    Refrigerate (mixed solution should be used within 24 hrs)  5.  Begin drinking the prep at 5:00 p.m. The MoviPrep container is divided by 4 marks.   Every 15 minutes drink the solution down to the next mark (approximately 8 oz) until the full liter is complete.   6.  Follow completed prep with 16 oz of clear liquid of your choice (Nothing red  or purple).  Continue to drink clear liquids until bedtime.  7.  Before going to bed, mix second dose of MoviPrep solution:    Empty 1 Pouch A and 1 Pouch B into the disposable container    Add lukewarm drinking water to the top line of the container. Mix to dissolve    Refrigerate  THE DAY OF YOUR PROCEDURE      DATE: 06/09/10 DAY: TUESDAY  Beginning at 6:30 a.m. (5 hours before procedure):         1. Every 15 minutes, drink the solution down to the next mark (approx 8 oz) until the full liter is complete.  2. Follow completed prep with 16 oz. of clear liquid of your choice.    3. You may drink clear liquids until 9:30 AM (2 HOURS BEFORE PROCEDURE).   MEDICATION INSTRUCTIONS  Unless otherwise instructed, you should take regular prescription medications with a small sip of water   as early as possible the morning of your procedure.  Additional medication instructions: _  STAY ON COUMADIN PER DR. Tabius Rood  YOU WILL HAVE YOUR PT/INR CHECKED THE FRIDAY BEFORE YOUR PROCEDURE EARLY IN MORNING.         OTHER INSTRUCTIONS  You will need a responsible adult at least 67 years of age to accompany you and drive you home.   This person must remain in the waiting room during your procedure.  Wear loose fitting clothing that is easily removed.  Leave jewelry and other valuables at home.  However, you may wish to bring a book to read or  an iPod/MP3 player to listen to music as you wait for your procedure to start.  Remove all body piercing jewelry and leave at home.  Total time from sign-in until discharge is approximately 2-3 hours.  You should go home directly after your procedure and rest.  You can resume normal activities the  day after your procedure.  The day of your procedure you should not:   Drive   Make legal decisions   Operate machinery   Drink alcohol   Return to work  You will receive specific instructions about eating, activities and medications before  you leave.    The above instructions have been reviewed and explained to me by   _______________________    I fully understand and can verbalize these instructions _____________________________ Date _________

## 2010-08-18 NOTE — Progress Notes (Signed)
Summary: lab reminder   Phone Note Outgoing Call   Call placed by: Randye Lobo [place],  June 04, 2010 3:08 PM Call placed to: Patient Summary of Call: Called patient to remind her to have PT/INR drawn tomorrow morning as instructed by Dr. Henrene Pastor.  No Answer.  Randye Lobo [facility]  June 04, 2010 3:09 PM Pt. called back and I reminded her to go get her labs in the morning and she will do so.  Initial call taken by: Randye Lobo NCMA,  June 04, 2010 3:15 PM

## 2010-08-18 NOTE — Assessment & Plan Note (Signed)
Summary: 6 month rov./sl      Allergies Added:   Visit Type:  6 month follow up Referring Ronny Korff:  n/a Primary [name]:  [name] Kehr, MD  CC:  shortness of breath and .  History of Present Illness: [name] is a pleasant  female, who has a history of coronary artery disease, status post PCI of the right coronary artery. Her last Myoview was performed on August 29, 2007.  Her ejection fraction was calculated at 49%, but visually appeared better.  There was breast attenuation, but no ischemia. The patient apparently was admitted in December of [id number] with an embolic CVA. An echocardiogram showed normal LV function and a small pericardial effusion. She subsequently had a TEE which revealed normal LV function and a moderate effusion. There were no vegetations. She also apparently was placed on Coumadin with a diagnosis of paroxysmal atrial fibrillation. Carotid Dopplers performed in August 2011 showed 40-59% left stenosis and 0-39% right; f/u recommended in 2 years.  I last saw her in March of 2011. Since then she denies any dyspnea, chest pain, palpitations or syncope and there is no pedal edema. There is been no bleeding. She has had problems swallowing and is being evaluated.  Current Medications (verified): 1)  Labetalol Hcl 100 Mg  Tabs (Labetalol Hcl) .... 2 Tabs Every Day 2)  Amlodipine Besylate 10 Mg  Tabs (Amlodipine Besylate) .... Take 1 By Mouth Qd 3)  Pravachol 40 Mg Tabs (Pravastatin Sodium) .Marland Kitchen.. 1 By Mouth Qd 4)  Coumadin 5 Mg Tabs (Warfarin Sodium) .... Tues and Thurs 1 Tab The Rest of The Week 1 1/2 Tab Once Daily 5)  Diovan 160 Mg Tabs (Valsartan) .... Take 1 Tab in Am and 1 in Pm 6)  Epogen 4000 Unit/ml Soln (Epoetin Alfa) .... As Directed Dialysis 7)  Renvela 800 Mg Tabs (Sevelamer Carbonate) .... Take 1 Tablet By Mouth Three Times A Day 8)  Vitamin C Cr 500 Mg Cr-Caps (Ascorbic Acid) .Marland Kitchen.. 1 Po Qd 9)  Lidocaine-Prilocaine 2.5-2.5 % Crea (Lidocaine-Prilocaine) .Marland Kitchen.. 1-2  Hours Before Procedure 10)  Renal  Tabs (Multiple Vitamins-Minerals) .... Once Daily 11)  Onetouch Ultrasoft Lancets  Misc (Lancets) .... Test Two Times A Day As Needed 12)  Onetouch Ultra Test  Strp (Glucose Blood) .... Test Two Times A Day As Needed 13)  Sensipar 30 Mg Tabs (Cinacalcet Hcl) .Marland Kitchen.. 1 Tab By Mouth Once Daily 14)  Keppra 250 Mg Tabs (Levetiracetam) .Marland Kitchen.. 1 By Mouth Two Times A Day 15)  Hydroxyzine Hcl 25 Mg Tabs (Hydroxyzine Hcl) .Marland Kitchen.. 1-2 Tabs By Mouth Two Times A Day As Needed Itching 16)  Dexilant 60 Mg Cpdr (Dexlansoprazole) .Marland Kitchen.. 1 By Mouth Once Daily 17)  Nystatin 100000 Unit/ml Susp (Nystatin) .... Take 5 Cc Orally and Gargle and Swallow  Allergies (verified): 1)  ! Sulfa 2)  ! Bactrim Ds (Sulfamethoxazole-Trimethoprim) 3)  ! * Red Dye 4)  ! * Plastic Tape  Past History:  Past Medical History: CORONARY ARTERY DISEASE (ICD-414.00)      a.  S/P Acute Inf. MI 05/2002, with PCI/Express BMS to RCA.  OTW nonobs dzs and NL EF.      b.  11/2003 - cath - Nonobs dzs.      c..  08/2007 - Adenosine MV: EF 49% (visually appeared better); Breast attenuation w/o ischemia. PAROXYSMAL ATRIAL FIBRILLATION (ICD-427.31) MYOCARDIAL INFARCTION, HX OF (ICD-412) HYPERTENSION (ICD-401.9) HYPERLIPIDEMIA (ICD-272.4) DIASTOLIC CONGESTIVE HEART FAILURE      a.  06/2008, Echo:  ef 60-70%.  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 PCI of Rt Coronary Artery Cholecystectomy 2009  She has had removal of a left AV graft.   She had a right AV fistula.  She has had ,   nasal septal surgery, tonsillectomy, colonoscopy.  Hysterectomy Oophorectomy Tubal ligation Nephrectomy 2010 no Ca on bx     Social History: Reviewed history from 01/14/2009 and no changes required. Retired Married x 20 y 2010 Patient is a former smoker.  Alcohol Use - no Illicit Drug Use - no Daily Caffeine Use 1 cup coffee  Review of Systems       Difficulties with swallowing but no fevers or chills, productive cough, hemoptysis, dysphasia, odynophagia, melena, hematochezia, dysuria, hematuria, rash, seizure activity, orthopnea, PND, pedal edema, claudication. Remaining systems are negative.   Vital Signs:  Patient profile:   65 year old female Height:      61.5 inches Weight:      162.75 pounds BMI:     30.36 Pulse rate:   70 / minute BP sitting:   158 / 80  (right arm) Cuff size:   regular  Vitals Entered By: Hansel Feinstein CMA (April 28, 2010 11:53 AM)  Physical Exam  General:  Well-developed well-nourished in no acute distress.  Skin is warm and dry.  HEENT is normal.  Neck is supple. No thyromegaly.  Chest is clear to auscultation with normal expansion.  Cardiovascular exam is regular rate and rhythm. 2/6 systolic ejection murmur. Abdominal exam nontender or distended. No masses palpated. Extremities show no edema. neuro grossly intact     EKG  Procedure date:  04/28/2010  Findings:      Sinus rhythm at a rate of 70. Axis normal. Occasional PACs. Cannot rule out prior septal infarct.  Impression & Recommendations:  Problem # 1:  RENAL  FAILURE-CHRONIC (ICD-585.9) Management per nephrology. Patient on transplant list.  Problem # 2:  CAROTID STENOSIS (ICD-433.10) Continue statin. Followup carotid Dopplers August 2013. Her updated medication list for this problem includes:    Coumadin 5 Mg Tabs (Warfarin sodium) .Marland Kitchen... Tues and thurs 1 tab the rest of the week 1 1/2 tab once daily  Problem # 3:  CORONARY ARTERY DISEASE (ICD-414.00) Continue present medications. Her aspirin was discontinued previously for unclear reasons. I've asked her to review this with her primary care physician and nephrology. If no contraindication resumed at 81 mg p.o. daily. Her updated medication list for this problem includes:    Labetalol Hcl 100 Mg Tabs (Labetalol hcl) .Marland Kitchen... 2 tabs every day    Amlodipine Besylate 10 Mg Tabs (Amlodipine besylate) .Marland Kitchen... Take 1 by mouth qd    Coumadin 5 Mg Tabs (Warfarin sodium) .Marland Kitchen... Tues and thurs 1 tab the rest of the week 1 1/2 tab once daily  Orders: EKG w/ Interpretation (93000)  Problem # 4:  PAROXYSMAL ATRIAL FIBRILLATION (  ICD-427.31) Remains in sinus rhythm. Continue beta blocker and Coumadin. Her updated medication list for this problem includes:    Labetalol Hcl 100 Mg Tabs (Labetalol hcl) .Marland Kitchen... 2 tabs every day    Coumadin 5 Mg Tabs (Warfarin sodium) .Marland Kitchen... Tues and thurs 1 tab the rest of the week 1 1/2 tab once daily  Problem # 5:  HYPERTENSION (ICD-401.9) Blood pressure mildly elevated. Continue to follow and increase medications as needed. Her updated medication list for this problem includes:    Labetalol Hcl 100 Mg Tabs (Labetalol hcl) .Marland Kitchen... 2 tabs every day    Amlodipine Besylate 10 Mg Tabs (Amlodipine besylate) .Marland Kitchen... Take 1 by mouth qd    Diovan 160 Mg Tabs (Valsartan) .Marland Kitchen... Take 1 tab in am and 1 in pm  Problem # 6:  HYPERLIPIDEMIA (ICD-272.4) Continue statin. Her updated medication list for this problem includes:    Pravachol 40 Mg Tabs (Pravastatin sodium) .Marland Kitchen... 1 by mouth qd  Problem  # 7:  PERICARDIAL EFFUSION (ICD-423.9) Repeat echocardiogram. Her updated medication list for this problem includes:    Diovan 160 Mg Tabs (Valsartan) .Marland Kitchen... Take 1 tab in am and 1 in pm  Orders: Echocardiogram (Echo)  Problem # 8:  ANTICOAGULATION THERAPY (ICD-V58.61)  Problem # 9:  DIABETES MELLITUS, TYPE II (ICD-250.00)  Her updated medication list for this problem includes:    Diovan 160 Mg Tabs (Valsartan) .Marland Kitchen... Take 1 tab in am and 1 in pm  Patient Instructions: 1)  Your physician recommends that you schedule a follow-up appointment in: 1 year with Dr. [name] 2)  Your physician has requested that you have an echocardiogram.  Echocardiography is a painless test that uses sound waves to create images of your heart. It provides your doctor with information about the size and shape of your heart and how well your heart's chambers and valves are working.  This procedure takes approximately one hour. There are no restrictions for this procedure.

## 2010-08-18 NOTE — Letter (Signed)
Summary: Colonoscopy Letter  [facility]  [place], [place] [address]   Phone: [phone]  Fax: [phone]      May 19, 2010 MRN: [id number]   [name] [address] [place], [place]  [address]   Dear Ms. Douville,   According to your medical record, it is time for you to schedule a Colonoscopy. The American Cancer Society recommends this procedure as a method to detect early colon cancer. Patients with a family history of colon cancer, or a personal history of colon polyps or inflammatory bowel disease are at increased risk.  This letter has been generated based on the recommendations made at the time of your procedure. If you feel that in your particular situation this may no longer apply, please contact our office.  Please call our office at [phone] to schedule this appointment or to update your records at your earliest convenience.  Thank you for cooperating with [place] to provide you with the very best care possible.   Sincerely,  Docia Chuck. Henrene Pastor, M.D.  [facility] [phone]

## 2010-08-18 NOTE — Progress Notes (Signed)
Summary: DENTAL WORK  Phone Note Call from Patient   Summary of Call: SEE previous phone note regarding dental work. I have called pt's dentist at [facility] [address]. They are to call back with info regarding specific dental work pt needs to have done.   [phone] ( [facility]) Initial call taken by: Charlsie Quest, [place],  November 11, 2009 3:36 PM  Follow-up for Phone Call        Spoke w/Dentist, Dr Saddie Benders. Pt's treatment plan is to have front fillings and a crown on the bottom left. The dentist feels that there is limited blood w/these procedures and lovenox is prob not necessary. She is first scheduled to have a deep cleaning which will take 2 visits. He expects more bleeding w/this and feels lovenox may be necessary.  This will take approx 3 visits. Does patient need to stop coumadin? What would be the plan?   The dentist needs this plan in detail,when determined, written and faxed to him to keep w/pt's chart. [phone])  Follow-up by: Charlsie Quest, CMA,  Nov 18, 2009 1:46 PM  Additional Follow-up for Phone Call Additional follow up Details #1::        Usually dentists don't mind doing work on Coumadin pts when INR is<2.0. Would it be OK if [facility] Clinic checks INR and adjusts Coum prior to  cleaning procedures? Can she just have a regular cleaning, not a deep cleaning to reduce her risk? Additional Follow-up by: [name] MD,  Nov 18, 2009 6:09 PM    Additional Follow-up for Phone Call Additional follow up Details #2::    Dentist is concerned specifically w/the deep cleaning. He said pt needs this and it will involve bleeding enough to be concerned about coumadin. He will be doing this cleaning in two parts. What do you suggest for coumadin? Pt already has lovenox but I told her not to use it until she heard from our office. ......................Marland KitchenCharlsie Quest, CMA  Nov 18, 2009 6:29 PM   Additional Follow-up for Phone Call Additional follow up Details  #3:: Details for Additional Follow-up Action Taken: OK. Pls see 4/19  note    Additional Follow-up by: [name] MD,  Nov 19, 2009 11:46 AM

## 2010-08-18 NOTE — Progress Notes (Signed)
Summary: Patient assistance paperwork  Phone Note Refill Request   Printed prescription to send with patient assist. paperwork.  Initial call taken by: Ernestene Mention,  Dec 01, 2009 1:54 PM  Follow-up for Phone Call        completed and closed phone note. Follow-up by: Ernestene Mention,  Dec 02, 2009 2:01 PM    Prescriptions: LOVENOX 80 MG/0.8ML SOLN (ENOXAPARIN SODIUM) as dirrected once daily sq  #30 x 3   Entered by:   Ernestene Mention   Authorized by:   [name] MD   Signed by:   Ernestene Mention on 12/01/2009   Method used:   Printed then faxed to ...       [facility] [address] (retail)       [address] [place], [place]  [address]       Ph: [phone]       Fax: [phone]   RxID:   [id number]

## 2010-08-18 NOTE — Progress Notes (Signed)
Summary: DENTAL CLEANING  Phone Note From Other Clinic   Summary of Call: Pt's dentist called, PER MD pt needs premed for cleaning due to hx of stent. Pt informed, she has cleaning today and thursday. Does she need more than the 3 days of antibiotics?  Initial call taken by: Charlsie Quest, [place],  December 30, 2009 8:14 AM  Follow-up for Phone Call        agree Thx Follow-up by: [name] MD,  December 30, 2009 5:11 PM    New/Updated Medications: AMOXICILLIN 500 MG CAPS (AMOXICILLIN) 1 two times a day x 3 day Prescriptions: AMOXICILLIN 500 MG CAPS (AMOXICILLIN) 1 two times a day x 3 day  #6 x 0   Entered by:   Charlsie Quest, CMA   Authorized by:   [name] MD   Signed by:   Charlsie Quest, CMA on 12/30/2009   Method used:   Electronically to        [facility] # [id number]* (retail)       [address] [place], [place]  [address]       Ph: [phone]       Fax: [phone]   RxID:   [id number]

## 2010-08-18 NOTE — Letter (Signed)
Summary: Generic Letter  [facility]   [place], [place] [address]   Phone: [phone]  Fax: [phone]    11/20/2009  SONAL DEWITZ [address] [place], [place]  [address]  Dear Ms. Renner,  After speaking with your dentist and Dr Alain Marion we would like you to follow the instructions below. You will need to schedule two seperate appointments for a deep cleaning of your teeth. Follow the instructions below for those two appointments.  Stop Coumadin 4 d prior to deep teeth cleaning  Check INR once daily When INR is less than 1.7 start Lovenox 80 mg subcutaneously once daily DO NOT TAKE Lovenox on the day of deep teeth cleaning Next day following deep teeth cleaning restart Coumadin and Lovenox Stop Lovenox when INR is greater than 2.0 and continue Coumadin  You do not need to stop coumadin before your crown or dental fillings. Please call our office with any questions. We have sent a copy of this letter to your dentist for his review.        Sincerely,   Charlsie Quest, CMA (AAMA) for Dr A. Delesia Martinek  Appended Document: Generic Letter No mailed to patient, see other updated directions.

## 2010-08-18 NOTE — Letter (Signed)
Summary: Generic Letter  [facility]   [place], [place] [address]   Phone: [phone]  Fax: [phone]    12/03/2009  RE: MARIESHA SCHOENIG [address] [place], [place]  [address]  Dr Saddie Benders,  Attached are letters to both the above named patient and to [facility]. Please contact our office with any questions.     Sincerely,   Charlsie Quest, CMA (AAMA) for Dr A. Demetria Iwai  Appended Document: Generic Letter Faxed to dentist, included were letter to patient and dialysis center

## 2010-08-20 ENCOUNTER — Telehealth (INDEPENDENT_AMBULATORY_CARE_PROVIDER_SITE_OTHER): Payer: Self-pay | Admitting: *Deleted

## 2010-08-20 ENCOUNTER — Encounter: Payer: Self-pay | Admitting: Cardiology

## 2010-08-20 ENCOUNTER — Ambulatory Visit (INDEPENDENT_AMBULATORY_CARE_PROVIDER_SITE_OTHER): Payer: Medicare Other | Admitting: Cardiology

## 2010-08-20 DIAGNOSIS — I359 Nonrheumatic aortic valve disorder, unspecified: Secondary | ICD-10-CM

## 2010-08-20 DIAGNOSIS — I2510 Atherosclerotic heart disease of native coronary artery without angina pectoris: Secondary | ICD-10-CM

## 2010-08-20 NOTE — Assessment & Plan Note (Signed)
Summary: 2 week follow up-lb   Vital Signs:  Patient profile:   65 year old [name] Height:      61.5 inches Weight:      161 pounds BMI:     30.04 Temp:     99.3 degrees F oral Pulse rate:   64 / minute Pulse rhythm:   irregular Resp:     16 per minute BP sitting:   168 / 70  (right arm) Cuff size:   regular  Vitals Entered By: Jonathon Resides, CMA(AAMA) (August 11, 2010 2:45 PM) CC: f/u  Is Patient Diabetic? Yes   Primary Care Provider:  Walker Kehr, MD  CC:  f/u .  History of Present Illness: F/u on constant copious salivation, chocking up, mucus production and odor  Current Medications (verified): 1)  Labetalol Hcl 100 Mg  Tabs (Labetalol Hcl) .... 2 Tabs Every Day 2)  Amlodipine Besylate 10 Mg  Tabs (Amlodipine Besylate) .... Take 1 By Mouth Qd 3)  Pravachol 40 Mg Tabs (Pravastatin Sodium) .Marland Kitchen.. 1 By Mouth Qd 4)  Coumadin 5 Mg Tabs (Warfarin Sodium) .... Tues and Thurs 1 Tab The Rest of The Week 1 1/2 Tab Once Daily 5)  Diovan 160 Mg Tabs (Valsartan) .... Take 1 Tab in Am and 1 in Pm 6)  Epogen 4000 Unit/ml Soln (Epoetin Alfa) .... As Directed Dialysis 7)  Renvela 800 Mg Tabs (Sevelamer Carbonate) .... Take 1 Tablet By Mouth Three Times A Day 8)  Vitamin C Cr 500 Mg Cr-Caps (Ascorbic Acid) .Marland Kitchen.. 1 Po Qd 9)  Lidocaine-Prilocaine 2.5-2.5 % Crea (Lidocaine-Prilocaine) .Marland Kitchen.. 1-2 Hours Before Procedure 10)  Renal  Tabs (Multiple Vitamins-Minerals) .... Once Daily 11)  Onetouch Ultrasoft Lancets  Misc (Lancets) .... Test Two Times A Day As Needed 12)  Onetouch Ultra Test  Strp (Glucose Blood) .... Test Two Times A Day As Needed 13)  Sensipar 30 Mg Tabs (Cinacalcet Hcl) .Marland Kitchen.. 1 Tab By Mouth Once Daily 14)  Keppra 250 Mg Tabs (Levetiracetam) .Marland Kitchen.. 1 By Mouth Two Times A Day 15)  Hydroxyzine Hcl 25 Mg Tabs (Hydroxyzine Hcl) .Marland Kitchen.. 1-2 Tabs By Mouth Two Times A Day As Needed Itching 16)  Nystatin 100000 Unit/ml Susp (Nystatin) .... Take 5 Cc Orally and Gargle and Swallow 17)   Flonase 50 Mcg/act Susp (Fluticasone Propionate) .... As Directed 18)  Ranitidine Hcl 150 Mg Tabs (Ranitidine Hcl) .Marland Kitchen.. 1 By Mouth Two Times A Day For Indigestion 19)  Metoclopramide Hcl 5 Mg Tabs (Metoclopramide Hcl) .Marland Kitchen.. 1 Three Times A Day Before Meals 20)  Augmentin 500-125 Mg Tabs (Amoxicillin-Pot Clavulanate) .Marland Kitchen.. 1 By Mouth Once Daily 21)  Clonidine Hcl 0.1 Mg Tabs (Clonidine Hcl) .Marland Kitchen.. 1 By Mouth Bid  Allergies (verified): 1)  ! Sulfa 2)  ! Bactrim Ds (Sulfamethoxazole-Trimethoprim) 3)  ! * Red Dye 4)  ! * Plastic Tape  Past History:  Past Medical History: Last updated: 06/16/2010 CORONARY ARTERY DISEASE (ICD-414.00)      a.  S/P Acute Inf. MI 05/2002, with PCI/Express BMS to RCA.  OTW nonobs dzs and NL EF.      b.  11/2003 - cath - Nonobs dzs.      c..  08/2007 - Adenosine MV: EF 49% (visually appeared better); Breast attenuation w/o ischemia. PAROXYSMAL ATRIAL FIBRILLATION (ICD-427.31) MYOCARDIAL INFARCTION, HX OF (ICD-412) HYPERTENSION (ICD-401.9) HYPERLIPIDEMIA (ICD-272.4) DIASTOLIC CONGESTIVE HEART FAILURE      a.  06/2008, Echo:  ef 60-70%. 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   She had a right AV fistula.  She has had ,  nasal septal surgery, tonsillectomy, colonoscopy.  Hysterectomy Oophorectomy Tubal ligation Nephrectomy 2010 no Ca on bx     Social History: Last updated: 01/14/2009 Retired Married x 50 y 2010 Patient is a former smoker.  Alcohol Use - no Illicit Drug Use - no Daily Caffeine Use 1 cup coffee  Review of Systems       The patient complains of anorexia, weight loss, dyspnea on exertion, abdominal pain, melena, severe indigestion/heartburn, and depression.  The patient denies weight gain, chest pain, and difficulty walking.    Physical Exam  General:  Chronically ill-appearing  NAD Spitting less brown sputum in a cup all the time - every min. Nose:  External nasal examination shows no deformity or inflammation. Nasal mucosa are pink and moist without lesions or exudates. Mouth:  Oral mucosa and oropharynx without lesions or exudates.  Teeth in good repair. Neck:  B sabmand salivary gland mild enlargement with granular texture Lungs:  Clear bilaterally to auscultation and percussion. Heart:  irreg, irreg s1/s2 no s3/s4/murmurs. Abdomen:  round, soft, nt/nd/bs+x4. Msk:  Lumbar-sacral spine is tender to palpation over paraspinal muscles and painfull with the ROM  Neurologic:  Alert and oriented x 3. Skin:  Nodules and papules scattered Alopecia on L scalp Excoriations and erythematous rashes Psych:  depressed affect.  Oriented X3, memory intact for recent and remote, good eye contact, and not anxious appearing.     Impression & Recommendations:  Problem # 1:  DISTURBANCE OF SALIVARY SECRETION (ICD-527.7) Assessment Unchanged Increase Clonidine to 0.2 bid Start Metronidazole We can try Atropine by mouth if ok with Dr [name] on Wed (cardiac risks discussed). I gave her a  Rx but she will not take it untill her Card appt. I'm at loss - not sure what else to offer. D/c Majic mouthwash - it did not help.Marland KitchenMarland KitchenScopolamine is another option...  Problem # 2:  RENAL FAILURE-CHRONIC (ICD-585.9) Assessment: Unchanged  Problem # 3:  HYPERTENSION (ICD-401.9) Assessment: Improved  The following medications were removed from the medication list:    Clonidine Hcl 0.1 Mg Tabs (Clonidine hcl) .Marland Kitchen... 1 by mouth bid Her updated medication list for this problem includes:    Labetalol Hcl 100 Mg Tabs (Labetalol hcl) .Marland Kitchen... 2 tabs every day    Amlodipine Besylate 10 Mg Tabs (Amlodipine besylate) .Marland Kitchen... Take 1 by mouth qd    Diovan 160 Mg Tabs (Valsartan) .Marland Kitchen... Take 1 tab in am and 1 in pm    Clonidine Hcl 0.2 Mg Tabs (Clonidine hcl) .Marland Kitchen... 1 by mouth bid  BP today: 168/70 Prior BP: 188/84 (07/28/2010)  Labs Reviewed: K+: 4.0 (10/02/2009) Creat: : 5.1 (10/02/2009)   Chol: 120 (10/02/2009)  HDL: 71.80 (10/02/2009)   LDL: 32 (10/02/2009)   TG: 80.0 (10/02/2009)  Problem # 4:  CONGESTIVE HEART FAILURE (ICD-428.0) Assessment: Improved  Her updated medication list for this problem includes:    Labetalol Hcl 100 Mg Tabs (Labetalol hcl) .Marland Kitchen... 2 tabs every day    Coumadin 5 Mg Tabs (Warfarin sodium) .Marland Kitchen... Tues and thurs 1 tab the rest of the week 1 1/2 tab once daily    Diovan 160 Mg Tabs (Valsartan) .Marland Kitchen... Take 1 tab in am and 1 in pm  Complete Medication List: 1)  Labetalol Hcl 100 Mg Tabs (Labetalol hcl) .... 2 tabs every day 2)  Amlodipine Besylate 10 Mg Tabs (Amlodipine besylate) .... Take 1 by mouth qd 3)  Pravachol 40 Mg Tabs (Pravastatin sodium) .Marland Kitchen.. 1 by mouth qd 4)  Coumadin 5 Mg Tabs (Warfarin sodium) .... Tues and thurs 1 tab the rest of the week 1 1/2 tab once daily 5)  Diovan 160 Mg Tabs (Valsartan) .... Take 1 tab in am and 1 in pm 6)  Epogen 4000 Unit/ml Soln (Epoetin alfa) .... As directed dialysis 7)  Renvela 800 Mg Tabs (Sevelamer carbonate) .... Take 1 tablet by  mouth three times a day 8)  Vitamin C Cr 500 Mg Cr-caps (Ascorbic acid) .Marland Kitchen.. 1 po qd 9)  Lidocaine-prilocaine 2.5-2.5 % Crea (Lidocaine-prilocaine) .Marland Kitchen.. 1-2 hours before procedure 10)  Renal Tabs (Multiple vitamins-minerals) .... Once daily 11)  Onetouch Ultrasoft Lancets Misc (Lancets) .... Test two times a day as needed 12)  Onetouch Ultra Test Strp (Glucose blood) .... Test two times a day as needed 13)  Sensipar 30 Mg Tabs (Cinacalcet hcl) .Marland Kitchen.. 1 tab by mouth once daily 14)  Keppra 250 Mg Tabs (Levetiracetam) .Marland Kitchen.. 1 by mouth two times a day 15)  Hydroxyzine Hcl 25 Mg Tabs (Hydroxyzine hcl) .Marland Kitchen.. 1-2 tabs by mouth two times a day as needed itching 16)  Nystatin 100000 Unit/ml Susp (Nystatin) .... Take 5 cc orally and gargle and swallow 17)  Flonase 50 Mcg/act Susp (Fluticasone propionate) .... As directed 18)  Ranitidine Hcl 150 Mg Tabs (Ranitidine hcl) .Marland Kitchen.. 1 by mouth two times a day for indigestion 19)  Metoclopramide Hcl 5 Mg Tabs (Metoclopramide hcl) .Marland Kitchen.. 1 three times a day before meals 20)  Augmentin 500-125 Mg Tabs (Amoxicillin-pot clavulanate) .Marland Kitchen.. 1 by mouth once daily 21)  Clonidine Hcl 0.2 Mg Tabs (Clonidine hcl) .Marland Kitchen.. 1 by mouth bid 22)  Atropine Sulfate 0.05 Mg/ml Soln (Atropine sulfate) .Marland Kitchen.. 1-2 ml by mouth q4 hrs for exessive salivation 23)  Metronidazole 250 Mg Tabs (Metronidazole) .Marland Kitchen.. 1 by mouth qid  Patient Instructions: 1)  Please schedule a follow-up appointment in 1 month. 2)  Do not take Atropine before you check with Dr [name]. 3)  Use the Sinus rinse as needed  Prescriptions: METRONIDAZOLE 250 MG TABS (METRONIDAZOLE) 1 by mouth qid  #80 x 1   Entered and Authorized by:   [name] MD   Signed by:   [name] MD on 08/11/2010   Method used:   Electronically to        [name] # [phone]* (retail)       [place], [place]  [address]       Ph: [phone]       Fax: [phone]   RxID:   [id number] ATROPINE  SULFATE 0.05 MG/ML SOLN (ATROPINE SULFATE) 1-2 ml by mouth q4 hrs for exessive  salivation  #100 ml x 3   Entered and Authorized by:   [name] MD   Signed by:   [name] MD on 08/11/2010   Method used:   Electronically to        [facility] # [phone]* (retail)       [place], [place]  [address]       Ph: [phone]       Fax: [phone]   RxID:   [id number] CLONIDINE HCL 0.2 MG TABS (CLONIDINE HCL) 1 by mouth bid  #60 x 11   Entered and Authorized by:   [name] MD   Signed by:   [name] MD on 08/11/2010   Method used:   Print then Give to Patient   RxID:   [id number]    Orders Added: 1)  Est. Patient Level IV [id number]

## 2010-08-20 NOTE — Progress Notes (Signed)
  Phone Note From Pharmacy   Summary of Call: Per [facility] they are not able to fill this medication ATROPINE SULFATE 0.05 MG/ML SOLN. They state that this must go through a compounding pharmacy instead.Marland KitchenMarland KitchenEllison Hughs Archie CMA  August 11, 2010 4:59 PM   Follow-up for Phone Call        Noted. She can wait till her appt w/Cardiology. Follow-up by: [name] MD,  August 11, 2010 6:23 PM

## 2010-08-20 NOTE — Assessment & Plan Note (Signed)
Summary: [address] /NWS #   Vital Signs:  Patient profile:   65 year old female Height:      61.5 inches Weight:      165 pounds BMI:     30.78 Temp:     99.5 degrees F oral Pulse rate:   88 / minute Pulse rhythm:   irregular Resp:     16 per minute BP sitting:   188 / 84  (right arm) Cuff size:   regular  Vitals Entered By: Jonathon Resides, CMA(AAMA) (July 28, 2010 1:55 PM) CC: 6 wk f/u  Is Patient Diabetic? No Comments pt is not using Nystatin   Primary Care Provider:  Walker Kehr, MD  CC:  6 wk f/u .  History of Present Illness: The patient presents for a follow up of hypertension, diabetes, hyperlipidemia and ESRD. C/o spitting all the time/chocking. The antibiotics helped a little. She has been to ENT x2 - no help.  Current Medications (verified): 1)  Labetalol Hcl 100 Mg  Tabs (Labetalol Hcl) .... 2 Tabs Every Day 2)  Amlodipine Besylate 10 Mg  Tabs (Amlodipine Besylate) .... Take 1 By Mouth Qd 3)  Pravachol 40 Mg Tabs (Pravastatin Sodium) .Marland Kitchen.. 1 By Mouth Qd 4)  Coumadin 5 Mg Tabs (Warfarin Sodium) .... Tues and Thurs 1 Tab The Rest of The Week 1 1/2 Tab Once Daily 5)  Diovan 160 Mg Tabs (Valsartan) .... Take 1 Tab in Am and 1 in Pm 6)  Epogen 4000 Unit/ml Soln (Epoetin Alfa) .... As Directed Dialysis 7)  Renvela 800 Mg Tabs (Sevelamer Carbonate) .... Take 1 Tablet By Mouth Three Times A Day 8)  Vitamin C Cr 500 Mg Cr-Caps (Ascorbic Acid) .Marland Kitchen.. 1 Po Qd 9)  Lidocaine-Prilocaine 2.5-2.5 % Crea (Lidocaine-Prilocaine) .Marland Kitchen.. 1-2 Hours Before Procedure 10)  Renal  Tabs (Multiple Vitamins-Minerals) .... Once Daily 11)  Onetouch Ultrasoft Lancets  Misc (Lancets) .... Test Two Times A Day As Needed 12)  Onetouch Ultra Test  Strp (Glucose Blood) .... Test Two Times A Day As Needed 13)  Sensipar 30 Mg Tabs (Cinacalcet Hcl) .Marland Kitchen.. 1 Tab By Mouth Once Daily 14)  Keppra 250 Mg Tabs (Levetiracetam) .Marland Kitchen.. 1 By Mouth Two Times A Day 15)  Hydroxyzine Hcl 25 Mg Tabs (Hydroxyzine Hcl)  .Marland Kitchen.. 1-2 Tabs By Mouth Two Times A Day As Needed Itching 16)  Nystatin 100000 Unit/ml Susp (Nystatin) .... Take 5 Cc Orally and Gargle and Swallow 17)  Flonase 50 Mcg/act Susp (Fluticasone Propionate) .... As Directed 18)  Ranitidine Hcl 150 Mg Tabs (Ranitidine Hcl) .Marland Kitchen.. 1 By Mouth Two Times A Day For Indigestion 19)  Metoclopramide Hcl 5 Mg Tabs (Metoclopramide Hcl) .Marland Kitchen.. 1 Three Times A Day Before Meals 20)  Augmentin 500-125 Mg Tabs (Amoxicillin-Pot Clavulanate) .Marland Kitchen.. 1 By Mouth Once Daily  Allergies (verified): 1)  ! Sulfa 2)  ! Bactrim Ds (Sulfamethoxazole-Trimethoprim) 3)  ! * Red Dye 4)  ! * Plastic Tape  Past History:  Past Medical History: Last updated: 06/16/2010 CORONARY ARTERY DISEASE (ICD-414.00)      a.  S/P Acute Inf. MI 05/2002, with PCI/Express BMS to RCA.  OTW nonobs dzs and NL EF.      b.  11/2003 - cath - Nonobs dzs.      c..  08/2007 - Adenosine MV: EF 49% (visually appeared better); Breast attenuation w/o ischemia. PAROXYSMAL ATRIAL FIBRILLATION (ICD-427.31) MYOCARDIAL INFARCTION, HX OF (ICD-412) HYPERTENSION (ICD-401.9) HYPERLIPIDEMIA (ICD-272.4) DIASTOLIC CONGESTIVE HEART FAILURE  a.  06/2008, Echo:  ef 60-70%. 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  Alcohol Use - no Illicit Drug Use - no Daily Caffeine Use 1 cup coffee  Review of Systems  The patient denies fever, weight loss, weight gain, dyspnea on exertion, and prolonged cough.    Physical Exam  General:  Chronically ill-appearing  NAD Spitting brown sputum in a cup all the time - every min. Head:  Normocephalic and atraumatic without obvious abnormalities. No apparent alopecia or balding. Ears:  External ear exam shows no significant lesions or deformities.  Otoscopic examination reveals clear canals, tympanic membranes are intact bilaterally without bulging, retraction, inflammation or discharge. Hearing is grossly normal bilaterally. Nose:  External nasal examination shows no deformity or inflammation. Nasal mucosa are pink and moist without lesions or exudates. Mouth:  Oral mucosa and oropharynx without lesions or exudates.  Teeth in good repair. Neck:  B sabmand salivary gland mild enlargement with granular texture Lungs:  Clear bilaterally to auscultation and percussion. Heart:  irreg, irreg s1/s2 no s3/s4/murmurs. Abdomen:  round, soft, nt/nd/bs+x4. Msk:  Lumbar-sacral spine is tender to palpation over paraspinal muscles and painfull with the ROM  Extremities:  No edema B Neurologic:  Alert and oriented x 3. Skin:  Nodules and papules scattered Alopecia on L scalp Excoriations and erythematous rashes Psych:  depressed affect.  Oriented X3, memory intact for recent and remote, good eye contact, and not anxious appearing.     Impression & Recommendations:  Problem # 1:  DISTURBANCE OF SALIVARY SECRETION (ICD-527.7) Assessment Deteriorated  Clonidine empiric Rx May need to use Atropine gtt Majic mouthwash We may need to get a saliva cytology   Clinical  bedside ultrasound was performed - US report:  B diffuse enlargement of submand salivary glands. No masses or stones/calcifications observed. Power doppler w/o abnormal activity. Images stored.   Orders: EMR Misc Charge Code [facility])  Problem # 2:  HYPERTENSION (ICD-401.9) Assessment: Deteriorated  Her updated medication list for this problem includes:    Labetalol Hcl 100 Mg Tabs (Labetalol hcl) .Marland Kitchen... 2 tabs every day    Amlodipine Besylate 10 Mg Tabs (Amlodipine besylate) .Marland Kitchen... Take 1 by mouth qd    Diovan 160 Mg Tabs (Valsartan) .Marland Kitchen... Take 1 tab in am and 1 in pm    Clonidine Hcl 0.1 Mg Tabs (Clonidine hcl) .Marland Kitchen... 1 by mouth bid  Problem # 3:  CAROTID STENOSIS (ICD-433.10) Assessment: Unchanged  Her updated medication list for this problem includes:    Coumadin 5 Mg Tabs (Warfarin sodium) .Marland Kitchen... Tues and thurs 1 tab the rest of the week 1 1/2 tab once daily  Problem # 4:  CONGESTIVE HEART FAILURE (ICD-428.0) Assessment: Improved  Her updated medication list for this problem includes:  Labetalol Hcl 100 Mg Tabs (Labetalol hcl) .Marland Kitchen... 2 tabs every day    Coumadin 5 Mg Tabs (Warfarin sodium) .Marland Kitchen... Tues and thurs 1 tab the rest of the week 1 1/2 tab once daily    Diovan 160 Mg Tabs (Valsartan) .Marland Kitchen... Take 1 tab in am and 1 in pm  Complete Medication List: 1)  Labetalol Hcl 100 Mg Tabs (Labetalol hcl) .... 2 tabs every day 2)  Amlodipine Besylate 10 Mg Tabs (Amlodipine besylate) .... Take 1 by mouth qd 3)  Pravachol 40 Mg Tabs (Pravastatin sodium) .Marland Kitchen.. 1 by mouth qd 4)  Coumadin 5 Mg Tabs (Warfarin sodium) .... Tues and thurs 1 tab the rest of the week 1 1/2 tab once daily 5)  Diovan 160 Mg Tabs (Valsartan) .... Take 1 tab in am and 1 in pm 6)  Epogen 4000 Unit/ml Soln (Epoetin alfa) .... As directed dialysis 7)  Renvela 800 Mg Tabs (Sevelamer carbonate) .... Take 1 tablet by mouth three times a day 8)  Vitamin C Cr 500 Mg Cr-caps (Ascorbic acid) .Marland Kitchen.. 1 po qd 9)   Lidocaine-prilocaine 2.5-2.5 % Crea (Lidocaine-prilocaine) .Marland Kitchen.. 1-2 hours before procedure 10)  Renal Tabs (Multiple vitamins-minerals) .... Once daily 11)  Onetouch Ultrasoft Lancets Misc (Lancets) .... Test two times a day as needed 12)  Onetouch Ultra Test Strp (Glucose blood) .... Test two times a day as needed 13)  Sensipar 30 Mg Tabs (Cinacalcet hcl) .Marland Kitchen.. 1 tab by mouth once daily 14)  Keppra 250 Mg Tabs (Levetiracetam) .Marland Kitchen.. 1 by mouth two times a day 15)  Hydroxyzine Hcl 25 Mg Tabs (Hydroxyzine hcl) .Marland Kitchen.. 1-2 tabs by mouth two times a day as needed itching 16)  Nystatin 100000 Unit/ml Susp (Nystatin) .... Take 5 cc orally and gargle and swallow 17)  Flonase 50 Mcg/act Susp (Fluticasone propionate) .... As directed 18)  Ranitidine Hcl 150 Mg Tabs (Ranitidine hcl) .Marland Kitchen.. 1 by mouth two times a day for indigestion 19)  Metoclopramide Hcl 5 Mg Tabs (Metoclopramide hcl) .Marland Kitchen.. 1 three times a day before meals 20)  Augmentin 500-125 Mg Tabs (Amoxicillin-pot clavulanate) .Marland Kitchen.. 1 by mouth once daily 21)  Clonidine Hcl 0.1 Mg Tabs (Clonidine hcl) .Marland Kitchen.. 1 by mouth bid 22)  Magic Mouthwash  .... 5 ml swish, hold and swallow qid  Patient Instructions: 1)  Please schedule a follow-up appointment in 2 weeks. Prescriptions: MAGIC MOUTHWASH 5 ml swish, hold and swallow qid  #300 ml x 2   Entered and Authorized by:   [name] MD   Signed by:   [name] MD on 07/28/2010   Method used:   Print then Give to Patient   RxID:   [id number] CLONIDINE HCL 0.1 MG TABS (CLONIDINE HCL) 1 by mouth bid  #60 x 6   Entered and Authorized by:   [name] MD   Signed by:   [name] MD on 07/28/2010   Method used:   Electronically to        [facility] # [phone]* (retail)       [address] [place], [place]  [address]       Ph: [phone]       Fax: [phone]   RxID:   [id number]    Orders Added: 1)  Est. Patient Level IV [id number] 2)  EMR Misc  Charge Code [EMRMisc]

## 2010-08-20 NOTE — Progress Notes (Signed)
Summary: REQ FOR ABX  Phone Note Call from Patient Call back at [facility] Phone [phone]   Summary of Call: Pt had sinus infection at last office visit, med has not helped. She is req alt rx from MD. C/o sinus complaints w/ thick discolored mucus that "chokes her".  Initial call taken by: Charlsie Quest, [place],  July 16, 2010 11:34 AM  Follow-up for Phone Call        ok to try Augmentin Follow-up by: [name] MD,  July 16, 2010 4:04 PM  Additional Follow-up for Phone Call Additional follow up Details #1::        left detailed vm on pts hm # Additional Follow-up by: Charlsie Quest, CMA,  July 16, 2010 5:08 PM    New/Updated Medications: AUGMENTIN 500-125 MG TABS (AMOXICILLIN-POT CLAVULANATE) 1 by mouth once daily Prescriptions: AUGMENTIN 500-125 MG TABS (AMOXICILLIN-POT CLAVULANATE) 1 by mouth once daily  #14 x 0   Entered and Authorized by:   [name] MD   Signed by:   Charlsie Quest, CMA on 07/16/2010   Method used:   Electronically to        [facility] # [phone]* (retail)       [address] [place], [place]  [address]       Ph: [phone]       Fax: [phone]   RxID:   [id number]

## 2010-08-20 NOTE — Letter (Signed)
Summary: [facility]   Imported By: Phillis Knack 07/30/2010 14:16:44  _____________________________________________________________________  External Attachment:    Type:   Image     Comment:   External Document

## 2010-08-26 ENCOUNTER — Telehealth (INDEPENDENT_AMBULATORY_CARE_PROVIDER_SITE_OTHER): Payer: Self-pay | Admitting: *Deleted

## 2010-08-26 NOTE — Assessment & Plan Note (Signed)
Summary: rov   Vital Signs:  Patient profile:   65 year old female Height:      61 inches Weight:      161 pounds BMI:     30.53 Pulse rate:   61 / minute BP sitting:   170 / 90  (right arm)  Vitals Entered By: Theodosia Quay, RN, BSN (August 20, 2010 8:25 AM)  Visit Type:  Follow-up Referring Provider:  n/a Primary Provider:  Walker Kehr, MD   History of Present Illness: Ms. Gemmell is a pleasant  female, who has a history of coronary artery disease, status post PCI of the right coronary artery. Her last Myoview was performed on August 29, 2007.  Her ejection fraction was calculated at 49%, but visually appeared better.  There was breast attenuation, but no ischemia. The patient apparently was admitted in December of [id number] with an embolic CVA. An echocardiogram showed normal LV function and a small pericardial effusion. She subsequently had a TEE which revealed normal LV function and a moderate effusion. There were no vegetations. She also apparently was placed on Coumadin with a diagnosis of paroxysmal atrial fibrillation. Carotid Dopplers performed in August 2011 showed 40-59% left stenosis and 0-39% right; f/u recommended in 2 years. Repeat echocardiogram in Oct 2011 revealed normal LV function, mild aortic stenosis with a mean gradient of 17 mm of mercury, mild mitral regurgitation, grade 2 diastolic dysfunction and a moderate pericardial effusion. There was moderate left atrial enlargement. I last saw her in November of 2011. Since then, she denies dyspnea on exertion, orthopnea, PND or syncope. She has had difficulties with choking sensations are and increased mucus production. She was placed on Flagyl and clonidine by her primary care physician. Since starting her Flagyl she notices a continuous chest tightness. It has been present for one week continuously. It improves with eating. She also apparently has been taking all the kidney transplant list because a followup echocardiogram at  [facility] was read as severe aortic stenosis.   Current Medications (verified): 1)  Labetalol Hcl 100 Mg  Tabs (Labetalol Hcl) .... 2 Tabs Every Day 2)  Amlodipine Besylate 10 Mg  Tabs (Amlodipine Besylate) .... Take 1 By Mouth Qd 3)  Pravachol 40 Mg Tabs (Pravastatin Sodium) .Marland Kitchen.. 1 By Mouth Qd 4)  Coumadin 5 Mg Tabs (Warfarin Sodium) .... Tues and Thurs 1 Tab The Rest of The Week 1 1/2 Tab Once Daily 5)  Diovan 160 Mg Tabs (Valsartan) .... Take 1 Tab in Am and 1 in Pm 6)  Epogen 4000 Unit/ml Soln (Epoetin Alfa) .... As Directed Dialysis 7)  Renvela 800 Mg Tabs (Sevelamer Carbonate) .... Take 1 Tablet By Mouth Three Times A Day 8)  Vitamin C Cr 500 Mg Cr-Caps (Ascorbic Acid) .Marland Kitchen.. 1 Po Qd 9)  Lidocaine-Prilocaine 2.5-2.5 % Crea (Lidocaine-Prilocaine) .Marland Kitchen.. 1-2 Hours Before Procedure 10)  Renal  Tabs (Multiple Vitamins-Minerals) .... Once Daily 11)  Onetouch Ultrasoft Lancets  Misc (Lancets) .... Test Two Times A Day As Needed 12)  Onetouch Ultra Test  Strp (Glucose Blood) .... Test Two Times A Day As Needed 13)  Sensipar 30 Mg Tabs (Cinacalcet Hcl) .Marland Kitchen.. 1 Tab By Mouth Once Daily 14)  Keppra 250 Mg Tabs (Levetiracetam) .Marland Kitchen.. 1 By Mouth Two Times A Day 15)  Hydroxyzine Hcl 25 Mg Tabs (Hydroxyzine Hcl) .Marland Kitchen.. 1-2 Tabs By Mouth Two Times A Day As Needed Itching 16)  Nystatin 100000 Unit/ml Susp (Nystatin) .... Take 5 Cc Orally and Gargle and Swallow 17)  Flonase 50 Mcg/act Susp (Fluticasone Propionate) .... As Directed 18)  Ranitidine Hcl 150 Mg Tabs (Ranitidine Hcl) .Marland Kitchen.. 1 By Mouth Two Times A Day For Indigestion 19)  Metoclopramide Hcl 5 Mg Tabs (Metoclopramide Hcl) .Marland Kitchen.. 1 Three Times A Day Before Meals 20)  Augmentin 500-125 Mg Tabs (Amoxicillin-Pot Clavulanate) .Marland Kitchen.. 1 By Mouth Once Daily 21)  Clonidine Hcl 0.2 Mg Tabs (Clonidine Hcl) .Marland Kitchen.. 1 By Mouth Bid  Allergies (verified): 1)  ! Sulfa 2)  ! Bactrim Ds (Sulfamethoxazole-Trimethoprim) 3)  ! * Red Dye 4)  ! * Plastic Tape  Past  History:  Past Medical History: Reviewed history from 06/16/2010 and no changes required. CORONARY ARTERY DISEASE (ICD-414.00)      a.  S/P Acute Inf. MI 05/2002, with PCI/Express BMS to RCA.  OTW nonobs dzs and NL EF.      b.  11/2003 - cath - Nonobs dzs.      c..  08/2007 - Adenosine MV: EF 49% (visually appeared better); Breast attenuation w/o ischemia. PAROXYSMAL ATRIAL FIBRILLATION (ICD-427.31) MYOCARDIAL INFARCTION, HX OF (ICD-412) HYPERTENSION (ICD-401.9) HYPERLIPIDEMIA (ICD-272.4) DIASTOLIC CONGESTIVE HEART FAILURE      a.  06/2008, Echo:  ef 60-70%. 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 PCI of Rt Coronary Artery Cholecystectomy 2009 She has had removal of a left AV graft.   She had a right AV fistula.  She has had ,  nasal septal surgery, tonsillectomy, colonoscopy.  Hysterectomy Oophorectomy Tubal ligation Nephrectomy 2010 no Ca on bx     Social History: Reviewed history from 01/14/2009 and no changes required. Retired Married x 51 y 2010 Patient is a former smoker.   Alcohol Use - no Illicit Drug Use - no Daily Caffeine Use 1 cup coffee  Review of Systems       Patient complains of decreased blood pressure on dialysis days and choking sensation/salivation but no fevers or chills, productive cough, hemoptysis, dysphasia,  melena, hematochezia, dysuria, hematuria, rash, seizure activity, orthopnea, PND, pedal edema, claudication. Remaining systems are negative.   Physical Exam  General:  Well-developed well-nourished in no acute distress.  Skin is warm and dry.  HEENT is normal.  Neck is supple. No thyromegaly.  Chest is clear to auscultation with normal expansion.  Cardiovascular exam is regular rate and rhythm. 2/6 systolic murmur left sternal border. S2 is not diminished. Abdominal exam nontender or distended. No masses palpated. Extremities show no edema. neuro grossly intact    Impression & Recommendations:  Problem # 1:  CAROTID STENOSIS (ICD-433.10) Continue statin. Not on aspirin at present as she is on Coumadin. Followup carotid Dopplers August 2013. Her updated medication list for this problem includes:    Coumadin 5 Mg Tabs (Warfarin sodium) .Marland Kitchen... Tues and thurs 1 tab the rest of the week 1 1/2 tab once  daily  Problem # 2:  END STAGE RENAL DISEASE (ICD-585.6) Blood pressure decreasing on dialysis days. I have asked her to hold her Diovan on those days to see if this helps. Orders: Nuclear Stress Test (Nuc Stress Test) EKG w/ Interpretation (93000)  Problem # 3:  CORONARY ARTERY DISEASE (ICD-414.00) Continue present medications. She is scheduled for potential renal transplant. Proceed with Myoview for risk stratification. Her updated medication list for this problem includes:    Labetalol Hcl 100 Mg Tabs (Labetalol hcl) .Marland Kitchen... 2 tabs every day    Amlodipine Besylate 10 Mg Tabs (Amlodipine besylate) .Marland Kitchen... Take 1 by mouth qd    Coumadin 5 Mg Tabs (Warfarin sodium) .Marland Kitchen... Tues and thurs 1 tab the rest of the week 1 1/2 tab once  daily  Orders: Nuclear Stress Test (Nuc Stress Test) EKG w/ Interpretation (93000)  Problem # 4:  PAROXYSMAL ATRIAL FIBRILLATION (ICD-427.31) Continue beta blocker and Coumadin. Her updated medication list for this problem includes:    Labetalol Hcl 100 Mg Tabs (Labetalol hcl) .Marland Kitchen... 2 tabs every day    Coumadin 5 Mg Tabs (Warfarin sodium) .Marland Kitchen... Tues and thurs 1 tab the rest of the week 1 1/2 tab once daily  Orders: Nuclear Stress Test (Nuc Stress Test) EKG w/ Interpretation (93000)  Problem # 5:  HYPERTENSION (ICD-401.9) We will follow blood pressure and adjust as indicated. I am hesitant to increase her medications given low blood pressure on dialysis days. Her updated medication list for this problem includes:    Labetalol Hcl 100 Mg Tabs (Labetalol hcl) .Marland Kitchen... 2 tabs every day    Amlodipine Besylate 10 Mg Tabs (Amlodipine besylate) .Marland Kitchen... Take 1 by mouth qd    Diovan 160 Mg Tabs (Valsartan) .Marland Kitchen... Take 1 tab in am and 1 in pm    Clonidine Hcl 0.2 Mg Tabs (Clonidine hcl) .Marland Kitchen... 1 by mouth bid  Orders: Nuclear Stress Test (Nuc Stress Test) EKG w/ Interpretation (93000)  Problem # 6:  PERICARDIAL EFFUSION (ICD-423.9) Obtain recent echocardiogram results from [facility]. The following medications were removed from the medication list:    Metronidazole 250 Mg Tabs (Metronidazole) .Marland Kitchen... 1 by mouth qid Her updated medication list for this problem includes:    Diovan 160 Mg Tabs (Valsartan) .Marland Kitchen... Take 1 tab in am and 1 in pm    Augmentin 500-125 Mg Tabs (Amoxicillin-pot clavulanate) .Marland Kitchen... 1 by mouth once daily  Problem # 7:  DIABETES MELLITUS, TYPE II (ICD-250.00)  Her updated medication list for this problem includes:    Diovan 160 Mg Tabs (Valsartan) .Marland Kitchen... Take 1 tab in am and 1 in pm  Problem # 8:  AORTIC STENOSIS (ICD-424.1) Aortic stenosis is mild on examination. S2 is not diminished. Previous echocardiogram at this office in October of 2011 suggested mild aortic stenosis.  Apparently it was felt to be severe by recent echocardiogram at [facility]. I will have the final results forwarded to me for review. We may need to ask the cardiologist at [facility] to review the echo and verify the results. Again I do not feel it is severe. Her updated medication list for this problem includes:    Labetalol Hcl 100 Mg Tabs (Labetalol hcl) .Marland Kitchen... 2 tabs every day    Diovan 160 Mg Tabs (Valsartan) .Marland Kitchen... Take 1 tab in am and 1 in pm  Problem # 9:  DISTURBANCE OF SALIVARY SECRETION (ICD-527.7) This is being evaluated at primary care. I asked her to discontinue her Flagyl. She is having chest pain  while taking this. She also feels that clonidine is causing side effects. She will begin to wean this off. I've asked her to call Dr. Judeen Hammans office for further management of this issue.  Patient Instructions: 1)  Your physician recommends that you continue on your current medications as directed. Please refer to the Current Medication list given to you today. 2)  Your physician wants you to follow-up in: 3 MONTHS.  You will receive a reminder letter in the mail two months in advance. If you don't receive a letter, please call our office to schedule the follow-up appointment. 3)  Your physician has requested that you have a Lexiscan myoview.  For further information please visit [email or web address].  Please follow instruction sheet, as given.   Orders Added: 1)  Nuclear Stress Test [Nuc Stress Test] 2)  EKG w/ Interpretation [93000]     EKG  Procedure date:  08/20/2010  Findings:      Sinus rhythm at a rate of 61. Left ventricular hypertrophy. Nonspecific ST changes. PACs.

## 2010-08-26 NOTE — Progress Notes (Signed)
  ROI faxed to [facility], received records took to [place]  August 20, 2010 9:50 AM

## 2010-08-27 ENCOUNTER — Ambulatory Visit (HOSPITAL_COMMUNITY): Payer: Medicare Other | Attending: Cardiology

## 2010-08-27 ENCOUNTER — Encounter: Payer: Self-pay | Admitting: Internal Medicine

## 2010-08-27 DIAGNOSIS — R0789 Other chest pain: Secondary | ICD-10-CM

## 2010-08-27 DIAGNOSIS — I252 Old myocardial infarction: Secondary | ICD-10-CM | POA: Insufficient documentation

## 2010-08-27 DIAGNOSIS — N186 End stage renal disease: Secondary | ICD-10-CM | POA: Insufficient documentation

## 2010-08-27 DIAGNOSIS — I2510 Atherosclerotic heart disease of native coronary artery without angina pectoris: Secondary | ICD-10-CM | POA: Insufficient documentation

## 2010-09-03 NOTE — Progress Notes (Signed)
Summary: Nuc Pre-Procedure  Phone Note Outgoing Call Call back at [facility] Phone [phone]   Call placed by: Hubbard Robinson RN,  August 26, 2010 4:37 PM Call placed to: Patient Reason for Call: Confirm/change Appt Summary of Call: Left message with information on Myoview Information Sheet (see scanned document for details).     Nuclear Med Background Indications for Stress Test: Evaluation for Ischemia, Stent Patency, PTCA Patency   History: Angioplasty, COPD, Echo, Heart Catheterization, Myocardial Infarction, Myocardial Perfusion Study, Stents  History Comments: 2003MI,Stent-RCA, 2009 MPS Nml EF 49%, 2005 Heart Cath RCA 40%, 10/11 Echo- 60-65%, mild AS, moderate pericardial  effusion Hx PAF  Symptoms: Chest Tightness    Nuclear Pre-Procedure Cardiac Risk Factors: Carotid Disease, CVA, Family History - CAD, History of Smoking, Hypertension, Lipids, NIDDM Height (in): 61

## 2010-09-03 NOTE — Assessment & Plan Note (Signed)
Summary: Cardiology Nuclear Testing  Nuclear Med Background Indications for Stress Test: Evaluation for Ischemia, Stent Patency, PTCA Patency   History: Angioplasty, COPD, Echo, Heart Catheterization, Myocardial Infarction, Myocardial Perfusion Study, Stents  History Comments: '03 MI>PTCA/Stent-RCA; '05 Cath:n/o CAD; '09 [id number], EF=49%; 10/11 Echo:EF= 60-65%, mild AS, moderate PE; Hx PAF  Symptoms: Chest Tightness  Symptoms Comments: Last episode of [id number] week.   Nuclear Pre-Procedure Cardiac Risk Factors: Carotid Disease, CVA, Family History - CAD, History of Smoking, Hypertension, Lipids, NIDDM, Overweight Caffeine/Decaff Intake: None NPO After: 5:30 AM Lungs: Clear.  O2 Sat 97% on RA. IV 0.9% NS with Angio Cath: 22g     IV Site: R Hand IV Started by: Crissie Figures, RN Chest Size (in) 42     Cup Size C     Height (in): 61 Weight (lb): 168 BMI: 31.86 Tech Comments: Labetalol held this a.m.  Nuclear Med Study 1 or 2 day study:  1 day     Stress Test Type:  Carlton Adam Reading MD:  Glori Bickers, MD     Referring MD:  [name], MD Resting Radionuclide:  Technetium 35m Tetrofosmin     Resting Radionuclide Dose:  10.9 mCi  Stress Radionuclide:  Technetium 47m Tetrofosmin     Stress Radionuclide Dose:  33.0 mCi   Stress Protocol   Lexiscan: 0.4 mg   Stress Test Technologist:  Valetta Fuller, CMA-N     Nuclear Technologist:  Charlton Amor, CNMT  Rest Procedure  Myocardial perfusion imaging was performed at rest 45 minutes following the intravenous administration of Technetium 6m Tetrofosmin.  Stress Procedure  The patient received IV Lexiscan 0.4 mg over 15-seconds.  Technetium 71m Tetrofosmin injected at 30-seconds.  There were no significant changes with infusion.  Quantitative spect images were obtained after a 45 minute delay.  QPS Raw Data Images:  There is a breast shadow that accounts for the anterior attenuation. Stress Images:  There is decreased uptake  in the anterior wall. Rest Images:  There is decreased uptake in the anterior wall. Subtraction (SDS):  There is a fixed anterior defect that is most consistent with breast attenuation. Transient Ischemic Dilatation:  0.96  (Normal <1.22)  Lung/Heart Ratio:  0.32  (Normal <0.45)  Quantitative Gated Spect Images QGS EDV:  176 ml QGS ESV:  79 ml QGS EF:  55 % QGS cine images:  Normal wall motion. LV mildly dilated.  Findings Normal nuclear study      Overall Impression  Exercise Capacity: Lexiscan with no exercise. ECG Impression: No significant ST segment change with Lexiscan. Overall Impression: Probably normal stress nuclear study. Overall Impression Comments: There is a fixed anterior defect that is most consistent with breast attenuation. LV mildly dilated with normal EF.  Appended Document: Cardiology Nuclear Testing ok  Appended Document: Cardiology Nuclear Testing pt aware of results

## 2010-09-09 NOTE — Consult Note (Signed)
Summary: Neurology/[facility]  Neurology/[facility]   Imported By: Phillis Knack 09/02/2010 09:06:19  _____________________________________________________________________  External Attachment:    Type:   Image     Comment:   External Document

## 2010-09-15 ENCOUNTER — Encounter: Payer: Self-pay | Admitting: Internal Medicine

## 2010-09-15 ENCOUNTER — Other Ambulatory Visit: Payer: Medicare Other

## 2010-09-15 ENCOUNTER — Ambulatory Visit (INDEPENDENT_AMBULATORY_CARE_PROVIDER_SITE_OTHER): Payer: Medicare Other | Admitting: Internal Medicine

## 2010-09-15 DIAGNOSIS — R05 Cough: Secondary | ICD-10-CM

## 2010-09-15 DIAGNOSIS — R059 Cough, unspecified: Secondary | ICD-10-CM

## 2010-09-15 DIAGNOSIS — R110 Nausea: Secondary | ICD-10-CM

## 2010-09-15 DIAGNOSIS — I2510 Atherosclerotic heart disease of native coronary artery without angina pectoris: Secondary | ICD-10-CM

## 2010-09-15 DIAGNOSIS — J3489 Other specified disorders of nose and nasal sinuses: Secondary | ICD-10-CM

## 2010-09-17 ENCOUNTER — Other Ambulatory Visit: Payer: Self-pay | Admitting: Internal Medicine

## 2010-09-18 ENCOUNTER — Encounter: Payer: Self-pay | Admitting: Internal Medicine

## 2010-09-22 ENCOUNTER — Encounter: Payer: Self-pay | Admitting: Internal Medicine

## 2010-09-22 ENCOUNTER — Institutional Professional Consult (permissible substitution) (INDEPENDENT_AMBULATORY_CARE_PROVIDER_SITE_OTHER): Payer: Medicare Other | Admitting: Internal Medicine

## 2010-09-22 DIAGNOSIS — R059 Cough, unspecified: Secondary | ICD-10-CM

## 2010-09-22 DIAGNOSIS — R05 Cough: Secondary | ICD-10-CM

## 2010-09-23 ENCOUNTER — Encounter (INDEPENDENT_AMBULATORY_CARE_PROVIDER_SITE_OTHER): Payer: Self-pay | Admitting: *Deleted

## 2010-09-23 ENCOUNTER — Telehealth (INDEPENDENT_AMBULATORY_CARE_PROVIDER_SITE_OTHER): Payer: Self-pay | Admitting: *Deleted

## 2010-09-24 ENCOUNTER — Telehealth (INDEPENDENT_AMBULATORY_CARE_PROVIDER_SITE_OTHER): Payer: Self-pay | Admitting: *Deleted

## 2010-09-24 NOTE — Assessment & Plan Note (Signed)
Summary: 1 month f/u #/cd   Vital Signs:  Patient profile:   65 year old female Height:      61 inches Weight:      165 pounds BMI:     31.29 Temp:     98.9 degrees F oral Pulse rate:   72 / minute Pulse rhythm:   regular Resp:     16 per minute BP sitting:   110 / 76  (right arm) Cuff size:   large  Vitals Entered By: Jonathon Resides, CMA(AAMA) (September 15, 2010 3:58 PM) CC: 1 mo f/u  Is Patient Diabetic? No Comments pt is not taking Ranitidine, Metoclopramide or Augmentin   Primary Care Provider:  Walker Kehr, MD  CC:  1 mo f/u .  History of Present Illness: The patient presents for a follow up of hypertension, diabetes, hyperlipidemia and CRF. She cont with copiouse secretions, cough and spitting up mucus. May be a little better w/clonidine during daytime. BP is better.  Current Medications (verified): 1)  Labetalol Hcl 100 Mg  Tabs (Labetalol Hcl) .... 2 Tabs Every Day 2)  Amlodipine Besylate 10 Mg  Tabs (Amlodipine Besylate) .... Take 1 By Mouth Qd 3)  Pravachol 40 Mg Tabs (Pravastatin Sodium) .Marland Kitchen.. 1 By Mouth Qd 4)  Coumadin 5 Mg Tabs (Warfarin Sodium) .Marland Kitchen.. 1 1/2 Tab Once Daily 5)  Diovan 160 Mg Tabs (Valsartan) .... Take 1 Tabtue, Thur, Sat 6)  Epogen 4000 Unit/ml Soln (Epoetin Alfa) .... As Directed Dialysis 7)  Renvela 800 Mg Tabs (Sevelamer Carbonate) .... Take 1 Tablet By Mouth Three Times A Day 8)  Vitamin C Cr 500 Mg Cr-Caps (Ascorbic Acid) .Marland Kitchen.. 1 Po Qd 9)  Lidocaine-Prilocaine 2.5-2.5 % Crea (Lidocaine-Prilocaine) .Marland Kitchen.. 1-2 Hours Before Procedure 10)  Renal  Tabs (Multiple Vitamins-Minerals) .... Once Daily 11)  Onetouch Ultrasoft Lancets  Misc (Lancets) .... Test Two Times A Day As Needed 12)  Onetouch Ultra Test  Strp (Glucose Blood) .... Test Two Times A Day As Needed 13)  Sensipar 30 Mg Tabs (Cinacalcet Hcl) .Marland Kitchen.. 1 Tab By Mouth Once Daily 14)  Keppra 250 Mg Tabs (Levetiracetam) .Marland Kitchen.. 1 By Mouth Two Times A Day 15)  Hydroxyzine Hcl 25 Mg Tabs (Hydroxyzine  Hcl) .Marland Kitchen.. 1-2 Tabs By Mouth Two Times A Day As Needed Itching 16)  Nystatin 100000 Unit/ml Susp (Nystatin) .... Take 5 Cc Orally and Gargle and Swallow 17)  Flonase 50 Mcg/act Susp (Fluticasone Propionate) .... As Directed 18)  Ranitidine Hcl 150 Mg Tabs (Ranitidine Hcl) .Marland Kitchen.. 1 By Mouth Two Times A Day For Indigestion 19)  Metoclopramide Hcl 5 Mg Tabs (Metoclopramide Hcl) .Marland Kitchen.. 1 Three Times A Day Before Meals 20)  Augmentin 500-125 Mg Tabs (Amoxicillin-Pot Clavulanate) .Marland Kitchen.. 1 By Mouth Once Daily 21)  Clonidine Hcl 0.2 Mg Tabs (Clonidine Hcl) .Marland Kitchen.. 1 By Mouth Bid 22)  Nexium 40 Mg Cpdr (Esomeprazole Magnesium) .Marland Kitchen.. 1 Po Once Daily 23)  Flagyl 250 Mg Tabs (Metronidazole) .Marland Kitchen.. 1 By Mouth Four Times A Day  Allergies (verified): 1)  ! Sulfa 2)  ! Bactrim Ds (Sulfamethoxazole-Trimethoprim) 3)  ! * Red Dye 4)  ! * Plastic Tape  Past History:  Past Medical History: Last updated: 06/16/2010 CORONARY ARTERY DISEASE (ICD-414.00)      a.  S/P Acute Inf. MI 05/2002, with PCI/Express BMS to RCA.  OTW nonobs dzs and NL EF.      b.  11/2003 - cath - Nonobs dzs.      c..  08/2007 - Adenosine  MV: EF 49% (visually appeared better); Breast attenuation w/o ischemia. PAROXYSMAL ATRIAL FIBRILLATION (ICD-427.31) MYOCARDIAL INFARCTION, HX OF (ICD-412) HYPERTENSION (ICD-401.9) HYPERLIPIDEMIA (ICD-272.4) DIASTOLIC CONGESTIVE HEART FAILURE      a.  06/2008, Echo:  ef 60-70%. 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  Alcohol Use - no Illicit Drug Use - no Daily Caffeine Use 1 cup coffee  Review of Systems       The patient complains of dyspnea on exertion and prolonged cough.  The patient denies fever, weight loss, chest pain, and hemoptysis.         congested sinuses  Physical Exam  General:  Chronically ill-appearing  NAD Spitting less brown sputum in a cup all the time - every min. Nose:  External nasal examination showsswollen mucosa with  inflammation. No exudates. Mouth:  Oral mucosa and oropharynx without lesions or exudates.  Teeth in good repair. Lungs:  Clear bilaterally to auscultation and percussion. Heart:  irreg, irreg s1/s2 no s3/s4/murmurs. Abdomen:  round, soft, nt/nd/bs+x4. Msk:  Lumbar-sacral spine is tender to palpation over paraspinal muscles and painfull with the ROM  Extremities:  No edema B Neurologic:  Alert and oriented x 3. Skin:  Nodules and papules scattered Alopecia on L scalp Excoriations and erythematous rashes Psych:  depressed affect.  Oriented X3, memory intact for recent and remote, good eye contact, and not anxious appearing.     Impression & Recommendations:  Problem # 1:  COUGH (ICD-786.2) ? etiol with massive saliva and throat secretions Assessment Unchanged May be a little better during the day on Clonidine (less secretions).Marland KitchenMarland KitchenS/p ENT, GI eval. Pulmonary w/up was negative so far.Marland KitchenMarland KitchenAbx did not help...  Sonography Report: Indication:coughing and spitting all the time Clinical bedside ultrasound was performed using Terason  3000 machine with a linear transducer.  The images were stored in Terason. B maxillary sinuses were scanned. No opacification was noted B. Impression: B maxillary sinuses w/o opacification. CXR was OK last fall Orders: T-Culture & Smear AFB ([id number]) T-Culture, Sputum & Gram Stain ([id number]) T- * Misc. Laboratory test [id number]) sputum cytology Pulmonary Referral (Pulmonary) EMR Misc Charge Code [facility])  Problem # 2:  RENAL FAILURE-CHRONIC (ICD-585.9) Assessment: Unchanged On HD  Problem # 3:  NAUSEA (ICD-787.02) Assessment: Unchanged due to #1 not using Reglan  Problem # 4:  CORONARY ARTERY DISEASE (ICD-414.00) Assessment: Unchanged CL was nl Her updated medication list for this problem includes:    Labetalol Hcl 100 Mg Tabs (Labetalol hcl) .Marland Kitchen... 2 tabs every day    Amlodipine Besylate 10 Mg Tabs (Amlodipine besylate) .Marland Kitchen... Take 1 by mouth qd    Diovan 160 Mg Tabs (Valsartan) .Marland Kitchen... Take 1 tabtue, thur, sat    Clonidine Hcl 0.2 Mg Tabs (Clonidine hcl) .Marland Kitchen... 1 by mouth tid  Problem #  5:  HYPERTENSION (ICD-401.9) Assessment: Improved  Her updated medication list for this problem includes:    Labetalol Hcl 100 Mg Tabs (Labetalol hcl) .Marland Kitchen... 2 tabs every day    Amlodipine Besylate 10 Mg Tabs (Amlodipine besylate) .Marland Kitchen... Take 1 by mouth qd    Diovan 160 Mg Tabs (Valsartan) .Marland Kitchen... Take 1 tabtue, thur, sat    Clonidine Hcl 0.2 Mg Tabs (Clonidine hcl) .Marland Kitchen... 1 by mouth tid  Problem # 6:  DYSPHAGIA, PHARYNGOESOPHAGEAL PHASE [id number]) Assessment: Unchanged  Problem # 7:  HYPERLIPIDEMIA (ICD-272.4) Assessment: Unchanged  Her updated medication list for this problem includes:    Pravachol 40 Mg Tabs (Pravastatin sodium) .Marland Kitchen... 1 by mouth qd  Problem # 8:  PRURITUS (ICD-698.9) Assessment: Improved  Complete Medication List: 1)  Labetalol Hcl 100 Mg Tabs (Labetalol hcl) .... 2 tabs every day 2)  Amlodipine Besylate 10 Mg Tabs (Amlodipine besylate) .... Take 1 by  mouth qd 3)  Pravachol 40 Mg Tabs (Pravastatin sodium) .Marland Kitchen.. 1 by mouth qd 4)  Coumadin 5 Mg Tabs (Warfarin sodium) .Marland Kitchen.. 1 1/2 tab once daily 5)  Diovan 160 Mg Tabs (Valsartan) .... Take 1 tabtue, thur, sat 6)  Epogen 4000 Unit/ml Soln (Epoetin alfa) .... As directed dialysis 7)  Renvela 800 Mg Tabs (Sevelamer carbonate) .... Take 1 tablet by mouth three times a day 8)  Vitamin C Cr 500 Mg Cr-caps (Ascorbic acid) .Marland Kitchen.. 1 po qd 9)  Lidocaine-prilocaine 2.5-2.5 % Crea (Lidocaine-prilocaine) .Marland Kitchen.. 1-2 hours before procedure 10)  Renal Tabs (Multiple vitamins-minerals) .... Once daily 11)  Onetouch Ultrasoft Lancets Misc (Lancets) .... Test two times a day as needed 12)  Onetouch Ultra Test Strp (Glucose blood) .... Test two times a day as needed 13)  Sensipar 30 Mg Tabs (Cinacalcet hcl) .Marland Kitchen.. 1 tab by mouth once daily 14)  Keppra 250 Mg Tabs (Levetiracetam) .Marland Kitchen.. 1 by mouth two times a day 15)  Hydroxyzine Hcl 25 Mg Tabs (Hydroxyzine hcl) .Marland Kitchen.. 1-2 tabs by mouth two times a day as needed itching 16)  Flonase 50 Mcg/act Susp (Fluticasone propionate) .... As directed 17)  Ranitidine Hcl 150 Mg Tabs (Ranitidine hcl) .Marland Kitchen.. 1 by mouth two times a day for indigestion 18)  Metoclopramide Hcl 5 Mg Tabs (Metoclopramide hcl) .Marland Kitchen.. 1 three times a day before meals 19)  Augmentin 500-125 Mg Tabs (Amoxicillin-pot clavulanate) .Marland Kitchen.. 1 by mouth once daily 20)  Clonidine Hcl 0.2 Mg Tabs (Clonidine hcl) .Marland Kitchen.. 1 by mouth tid 21)  Nexium 40 Mg Cpdr (Esomeprazole magnesium) .Marland Kitchen.. 1 po once daily 22)  Flagyl 250 Mg Tabs (Metronidazole) .Marland Kitchen.. 1 by mouth four times a day  Patient Instructions: 1)  Please schedule a follow-up appointment in 2 months. Prescriptions: CLONIDINE HCL 0.2 MG TABS (CLONIDINE HCL) 1 by mouth tid  #90 x 3   Entered and Authorized by:   [name] MD   Signed by:   [name] MD on 09/15/2010   Method used:   Print then Give to Patient   RxID:   [id number]    Orders  Added: 1)  T-Culture & Smear AFB [[id number]] 2)  T-Culture, Sputum & Gram Stain [[id number]] 3)  T- * Misc. Laboratory test [id number] 4)  Pulmonary Referral [Pulmonary] 5)  EMR Misc Charge Code [EMRMisc] 6)  Est. Patient Level IV [id number]  Appended Document: 1 month f/u #/cd Correction -  Indication for sinuses [place]: severe sinus drainage

## 2010-09-24 NOTE — Letter (Signed)
Summary: [facility] - Office Visit  [facility] - Office Visit   Imported By: Marilynne Drivers 09/14/2010 08:25:43  _____________________________________________________________________  External Attachment:    Type:   Image     Comment:   External Document

## 2010-09-24 NOTE — Letter (Signed)
Summary: [facility] - Transplant Eval  [place] - Transplant Eval   Imported By: Marilynne Drivers 09/14/2010 08:23:54  _____________________________________________________________________  External Attachment:    Type:   Image     Comment:   External Document

## 2010-09-24 NOTE — Assessment & Plan Note (Signed)
Summary: lexisan myoview dx  585.6/sl/MCR&BCBS MCR SUP, no pac rqd/ch  Nuclear Med Background Indications for Stress Test: Evaluation for Ischemia, Stent Patency, PTCA Patency   History: Angioplasty, COPD, Echo, Heart Catheterization, Myocardial Infarction, Myocardial Perfusion Study, Stents  History Comments: 2003MI,Stent-RCA, 2009 MPS Nml EF 49%, 2005 Heart Cath RCA 40%, 10/11 Echo- 60-65%, mild AS, moderate pericardial  effusion Hx PAF  Symptoms: Chest Tightness    Nuclear Pre-Procedure Cardiac Risk Factors: Carotid Disease, CVA, Family History - CAD, History of Smoking, Hypertension, Lipids, NIDDM Height (in): 61

## 2010-09-29 ENCOUNTER — Encounter: Payer: Self-pay | Admitting: Cardiology

## 2010-09-29 ENCOUNTER — Ambulatory Visit (HOSPITAL_COMMUNITY)
Admission: RE | Admit: 2010-09-29 | Discharge: 2010-09-29 | Disposition: A | Discharge status: Home / Self Care | Payer: Medicare Other | Source: Ambulatory Visit | Attending: Cardiology | Admitting: Cardiology

## 2010-09-29 DIAGNOSIS — I319 Disease of pericardium, unspecified: Secondary | ICD-10-CM | POA: Insufficient documentation

## 2010-09-29 DIAGNOSIS — I359 Nonrheumatic aortic valve disorder, unspecified: Secondary | ICD-10-CM | POA: Insufficient documentation

## 2010-09-29 NOTE — Assessment & Plan Note (Signed)
Summary: Pulmonary/ new pt eval for uacs   Visit Type:  Initial Consult Copy to:  Dr. Alain Marion Primary Provider/Referring Provider:  Walker Kehr, MD  CC:  Cough.  History of Present Illness: 36 yobf quit smoking in 2003 at [place] with no resp complaints at that point   September 22, 2010  1st pulmonary office eval cc indolent onset progressively worse  cough x 6 months daily worst first thing in am takes 45 min to clear clear white occ yellow occ slt bloody and when head hits the pillow and all night long.  neg ent w/u per pt.   assoc with mild dysphagia.    Pt denies any significant sore throat,  itching, sneezing,  nasal congestion or excess secretions,  fever, chills, sweats, unintended wt loss, pleuritic or exertional cp, hempoptysis, change in activity tolerance  orthopnea pnd or leg swelling. Pt also denies any obvious fluctuation in symptoms with weather or environmental change or other alleviating or aggravating factors.        Current Medications (verified): 1)  Labetalol Hcl 100 Mg  Tabs (Labetalol Hcl) .... 2 Tabs Every Day 2)  Amlodipine Besylate 10 Mg  Tabs (Amlodipine Besylate) .... Take 1 By Mouth Qd 3)  Pravachol 40 Mg Tabs (Pravastatin Sodium) .Marland Kitchen.. 1 By Mouth Qd 4)  Coumadin 5 Mg Tabs (Warfarin Sodium) .Marland Kitchen.. 1 1/2 Tab Once Daily 5)  Diovan 160 Mg Tabs (Valsartan) .... Take 1 Tabtue, Thur, Sat 6)  Epogen 4000 Unit/ml Soln (Epoetin Alfa) .... As Directed Dialysis 7)  Renvela 800 Mg Tabs (Sevelamer Carbonate) .... Take 1 Tablet By Mouth Three Times A Day 8)  Vitamin C Cr 500 Mg Cr-Caps (Ascorbic Acid) .Marland Kitchen.. 1 Po Qd 9)  Lidocaine-Prilocaine 2.5-2.5 % Crea (Lidocaine-Prilocaine) .Marland Kitchen.. 1-2 Hours Before Procedure 10)  Renal  Tabs (Multiple Vitamins-Minerals) .... Once Daily 11)  Onetouch Ultrasoft Lancets  Misc (Lancets) .... Test Two Times A Day As Needed 12)  Onetouch Ultra Test  Strp (Glucose Blood) .... Test Two Times A Day As Needed 13)  Sensipar 30 Mg Tabs (Cinacalcet Hcl)  .Marland Kitchen.. 1 Tab By Mouth Once Daily 14)  Keppra 250 Mg Tabs (Levetiracetam) .Marland Kitchen.. 1 By Mouth Two Times A Day 15)  Hydroxyzine Hcl 25 Mg Tabs (Hydroxyzine Hcl) .Marland Kitchen.. 1-2 Tabs By Mouth Two Times A Day As Needed Itching 16)  Flonase 50 Mcg/act Susp (Fluticasone Propionate) .... As Directed 17)  Metoclopramide Hcl 5 Mg Tabs (Metoclopramide Hcl) .Marland Kitchen.. 1 Three Times A Day Before Meals 18)  Clonidine Hcl 0.2 Mg Tabs (Clonidine Hcl) .Marland Kitchen.. 1 By Mouth Three Times A Day 19)  Nexium 40 Mg Cpdr (Esomeprazole Magnesium) .Marland Kitchen.. 1 Po Once Daily  Allergies (verified): 1)  ! Sulfa 2)  ! Bactrim Ds (Sulfamethoxazole-Trimethoprim) 3)  ! * Red Dye 4)  ! * Plastic Tape  Past History:  Past Medical History: CORONARY ARTERY DISEASE (ICD-414.00)      a.  S/P Acute Inf. MI 05/2002, with PCI/Express BMS to RCA.  OTW nonobs dzs and NL EF.      b.  11/2003 - cath - Nonobs dzs.      c..  08/2007 - Adenosine MV: EF 49% (visually appeared better); Breast attenuation w/o ischemia. PAROXYSMAL ATRIAL FIBRILLATION (ICD-427.31) MYOCARDIAL INFARCTION, HX OF (ICD-412) HYPERTENSION (ICD-401.9) HYPERLIPIDEMIA (ICD-272.4) DIASTOLIC CONGESTIVE HEART FAILURE      a.  06/2008, Echo:  ef 60-70%. 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 Family History of Diabetes: multiple family members  Her family history is remarkable for coronary artery disease.  Her   father died of a stroke in his 58s.  No FH of Colon Cancer:  Social History: Retired Married x 55 y 2010 Patient is a former smoker. Quit in 2003. Smoked for approx 30 yrs up to 1/4 ppd Alcohol Use - no Illicit Drug Use - no Daily Caffeine Use 1 cup coffee  Review of Systems       The patient complains of productive cough, indigestion, difficulty swallowing, sore throat, headaches, itching, ear ache, joint stiffness or pain, rash, and change in color of mucus.  The patient denies shortness of breath with activity, shortness of breath at rest, non-productive cough, coughing up blood, chest pain, irregular heartbeats, acid heartburn, loss of appetite, weight change, abdominal pain, tooth/dental problems, nasal congestion/difficulty breathing through nose, sneezing, anxiety, depression, hand/feet swelling, and fever.    Vital Signs:  Patient profile:   65 year old female Weight:      168 pounds O2 Sat:      97 % on Room air Temp:     97.9 degrees F oral Pulse rate:   61 / minute BP sitting:   122 / 76  (left arm)  Vitals Entered By: Tilden Dome (September 22, 2010 1:54 PM)  O2 Flow:  Room air  Physical Exam  Additional Exam:  obese amb bf nad freq throat clearing wt 168 September 22, 2010 HEENT: nl dentition, turbinates, and orophanx with no cobblestoning or evidence of excessive pnds. Nl external ear canals without cough reflex NECK :  without JVD/Nodes/TM/ nl carotid upstrokes bilaterally LUNGS: no acc muscle use, clear to A and P bilaterally without cough on insp or exp maneuvers CV:  RRR  no s3 or murmur or  increase in P2, no edema  ABD:  soft and nontender with nl excursion in the supine position. No bruits or organomegaly, bowel sounds nl MS:  warm without deformities, calf tenderness, cyanosis or clubbing SKIN: warm and dry without lesions   NEURO:  alert, approp, no deficits      CXR  Procedure date:  06/16/2010  Findings:      Cardiomegaly with pulmonary vascular engorgement but no frank pulmonary edema.  Impression & Recommendations:  Problem # 1:  COUGH (L2106332.2) The most common causes of chronic cough in immunocompetent adults include: upper airway cough syndrome (UACS), previously referred to as postnasal drip syndrome,  caused by variety of rhinosinus conditions; (2) asthma; (3) GERD; (4) chronic bronchitis from cigarette smoking or other inhaled environmental irritants; (5) nonasthmatic eosinophilic bronchitis; and (6) bronchiectasis. These conditions, singly or in combination, have accounted for up to 94% of the causes of chronic cough in prospective studies.   This is most c/w  Classic Upper airway cough syndrome,  so named because it's frequently impossible to sort out how much is  CR/sinusitis with freq throat clearing (which can be related to primary GERD)   vs  causing  secondary extra esophageal GERD from wide swings in gastric pressure that occur with throat clearing, promoting self use of mint and menthol lozenges that reduce the lower esophageal sphincter tone and exacerbate the problem further These are the same pts who not infrequently have failed to tolerate ace inhibitors,  dry powder inhalers or biphosphonates or report having reflux symptoms that don't respond to standard doses of PPI   For now I would assume this is all GERD until proven otherwise.  Of the three most common causes of chronic cough, only one (GERD)  can actually cause the other two and perpetuate the cylce of cough inducing airway trauma, inflammation, heightened sensitivity to reflux which is  prompted by the cough itself via a cyclical mechanism.  This may partially respond to steroids and look like asthma and post nasal drainage but never erradicated completely unless the cough and the secondary reflux are eliminated, preferably both at the same time.   See instructions for specific recommendation.    Discussed in detail all the  indications, usual  risks and alternatives  relative to the benefits with patient who agrees to proceed with short term reglan use in max doses to see if this helps     Problem # 2:  HYPERTENSION (ICD-401.9)  The following medications were removed from the medication list:    Labetalol Hcl 100 Mg Tabs (Labetalol hcl) .Marland Kitchen... 2 tabs every day    Amlodipine Besylate 10 Mg Tabs (Amlodipine besylate) .Marland Kitchen... Take 1 by mouth qd Her updated medication list for this problem includes:    Diovan 160 Mg Tabs (Valsartan) .Marland Kitchen... Take 1 tabtue, thur, sat    Clonidine Hcl 0.2 Mg Tabs (Clonidine hcl) .Marland Kitchen... 1 by mouth three times a day    Bystolic 10 Mg Tabs (Nebivolol hcl) ..... One twice daily  Nonspecif BB and CCB are problematic in chronic cough w/u and if possible need to be avoided until the cough is eliminated and then added back.     NB the  ramp to expected improvement (and for that matter, worsening, if a chronic effective medication is stopped)  can be measured in weeks, not days, a common misconception  in cough w/u's where there is  no immediate cause and effect relationship so that response to therapy or lack thereof can be very difficult to assess.   Medications Added to Medication List This Visit: 1)  Pravachol 40 Mg Tabs (Pravastatin sodium) .... One daily 2)  Metoclopramide Hcl 5 Mg Tabs (Metoclopramide hcl) .... Take one before each meal and at bedtime 3)  Clonidine Hcl 0.2 Mg Tabs (Clonidine hcl) .Marland Kitchen.. 1 by mouth three times a day 4)  Nexium 40 Mg Cpdr (Esomeprazole magnesium) .... Take  one 30-60 min before first meal of the day 5)  Prednisone 10 Mg  Tabs (Prednisone) .... 4 each am x 2days, 2x2days, 1x2days and stop 6)  Bystolic 10 Mg Tabs (Nebivolol hcl) .... One twice daily 7)  Pepcid 20 Mg Tabs (Famotidine) .... Take one by mouth at bedtime  Other Orders: New Patient Level V [id number]) Prescription Created Electronically [id number])  Patient Instructions: 1)  stop labetolol,  amlodipine  2)  Bystolic 10 mg one twice daily (take once daily if too sluggish or blood pressure too low 3)  Nexium Take  one 30-60 min  before first meal of the day and Pepcid at bedtime 4)  Reglan (metaclopramide) take one before each meal and at bedtime  5)  Prednisone 4 each am x 2days, 2x2days, 1x2days and stop  6)  GERD (REFLUX)  is a common cause of respiratory symptoms. It commonly presents without heartburn and can be treated with medication, but also with lifestyle changes including avoidance of late meals, excessive alcohol, smoking cessation, and avoid fatty foods, chocolate, peppermint, colas, red wine, and acidic juices such as orange juice. NO MINT OR MENTHOL PRODUCTS SO NO COUGH DROPS  7)  USE SUGARLESS CANDY INSTEAD (jolley ranchers)  8)  NO OIL BASED VITAMINS  9)  Please schedule a follow-up appointment in 2  weeks, sooner if needed  Prescriptions: PREDNISONE 10 MG  TABS (PREDNISONE) 4 each am x 2days, 2x2days, 1x2days and stop  #14 x 0   Entered and Authorized by:   Tanda Rockers MD   Signed by:   Tanda Rockers MD on 09/22/2010   Method used:   Electronically to        [facility] # [id number]* (retail)       [address] [place], [place]  [address]       Ph: [phone]       Fax: [phone]   RxID:   [id number]

## 2010-09-29 NOTE — Letter (Signed)
Summary: TEE Instructions  [name] [name]  [address] [name]   [name], [name] [address]   Phone: [phone]  Fax: [phone]      TEE Instructions  09/23/2010 MRN: [id number]  [name] [name], [name]  [address]  [name]      You are scheduled for a TEE on  TUESDAY 09-29-10 with Dr. [name].  Please arrive at the [facility] at     9 a.m.         on the day of your procedure.  1)   Diet:     A)   Nothing to eat or drink after midnight except your medications with        a sip of water.    B)   May have clear liquid breakfast.  Clear liquids include:  water, broth,        Sprite, Ginger Ale, black cofee, tea (no sugar), cranberry / grape /        apple juice, jello (not red), popsicle from clear juices (not red).  2)  Must have a responsible person to drive you home.  3)   Bring your current insurance cards and current list of all your medications.   *Special Note:  Every effort is made to have your procedure done on time.  Occasionally there are emergencies that present themselves at the hospital that may cause delays.  Please be patient if a delay does occur.  *If you have any questions after you get home, please call the office at [phone].

## 2010-09-29 NOTE — Progress Notes (Signed)
  Phone Note Outgoing Call   Call placed by: [name], RN,  September 23, 2010 10:54 AM Summary of Call: recieved a call from Nathalie at the kidney transplant office, they are requesting a TEE to evaluate the pts AS prior to putting her on the transplant list. TEE scheduled 09-29-10 @ 10. pt aware and instructions will be mailed to her home. [name], RN  September 23, 2010 10:57 AM

## 2010-09-29 NOTE — Progress Notes (Signed)
Summary: needs 2 wk f/u w/ wert  Phone Note Call from Patient Call back at Home Phone [phone]   Caller: Patient Call For: wert Summary of Call: pt was seen 09/21/10. needs to have rov in 2 wks. nothing avail. pls. advise. pt home # above or cell [phone]. note: pt can only do tues and thurs (dialysis) although she is not avail. on 3/13.  Initial call taken by: Cooper Render, CNA,  September 24, 2010 9:28 AM  Follow-up for Phone Call        Pt was seen on 09/22/10 by MW and was told to f/u in 2 wks.    Called, spoke with pt.  She can only come in on Tue and Thurs bc of diaylsis.  OV offered with TP but pt would rather wait to next available to see MW.  OV scheduled for 10/15/10 at 11am -- pt aware. Follow-up by: Raymondo Band RN,  September 24, 2010 10:03 AM

## 2010-10-12 ENCOUNTER — Encounter: Payer: Self-pay | Admitting: Internal Medicine

## 2010-10-15 ENCOUNTER — Encounter: Payer: Self-pay | Admitting: Internal Medicine

## 2010-10-15 ENCOUNTER — Ambulatory Visit (INDEPENDENT_AMBULATORY_CARE_PROVIDER_SITE_OTHER): Payer: Medicare Other | Admitting: Internal Medicine

## 2010-10-15 VITALS — BP 108/64 | HR 60 | Temp 98.2°F | Ht 62.0 in | Wt 167.6 lb

## 2010-10-15 DIAGNOSIS — I10 Essential (primary) hypertension: Secondary | ICD-10-CM

## 2010-10-15 DIAGNOSIS — R059 Cough, unspecified: Secondary | ICD-10-CM

## 2010-10-15 DIAGNOSIS — R05 Cough: Secondary | ICD-10-CM

## 2010-10-15 NOTE — Assessment & Plan Note (Signed)
Strongly prefer in the setting of chronic cough ( with ddx being cough variant asthma) : Bystolic, the most beta -1  selective Beta blocker available in sample form, with bisoprolol the most selective generic choice  on the market.

## 2010-10-15 NOTE — Assessment & Plan Note (Signed)
The most common causes of chronic cough in immunocompetent adults include the following: upper airway cough syndrome (UACS), previously referred to as postnasal drip syndrome (PNDS), which is caused by variety of rhinosinus conditions; (2) asthma; (3) GERD; (4) chronic bronchitis from cigarette smoking or other inhaled environmental irritants; (5) nonasthmatic eosinophilic bronchitis; and (6) bronchiectasis.   These conditions, singly or in combination, have accounted for up to 94% of the causes of chronic cough in prospective studies.   Other conditions have constituted no >6% of the causes in prospective studies These have included bronchogenic carcinoma, chronic interstitial pneumonia, sarcoidosis, left ventricular failure, ACEI-induced cough, and aspiration from a condition associated with pharyngeal dysfunction.   This is most likely a form of  Classic Upper airway cough syndrome, so named because it's frequently impossible to sort out how much is  CR/sinusitis with freq throat clearing (which can be related to primary GERD)   vs  causing  secondary (" extra esophageal")  GERD from wide swings in gastric pressure that occur with throat clearing, often  promoting self use of mint and menthol lozenges that reduce the lower esophageal sphincter tone and exacerbate the problem further in a cyclical fashion.   These are the same pts who not infrequently have failed to tolerate ace inhibitors,  dry powder inhalers or biphosphonates or report having reflux symptoms that don't respond to standard doses of PPI , and are easily confused as having aecopd or asthma flares,   .Chronic cough is often simultaneously caused by more than one condition. A single cause has been found from 38 to 82% of the time, multiple causes from 18 to 62%. Multiply caused cough has been the result of three diseases up to 42% of the time.      For now continue max gerd rx including diet and ppi/ reglan use discussed risk vs  benefit > no change rx x 6 more weeks then regroup.

## 2010-10-15 NOTE — Progress Notes (Signed)
Subjective:    [name], female    DOB: August 08, 1945, 65 y.o.   MRN: [id number]  HPI   Primary Provider/Referring Provider: Walker Kehr, MD   History of Present Illness:   75 yobf quit smoking in 2003 at [place] with no resp complaints at that point   September 22, 2010 1st pulmonary office eval cc indolent onset progressively worse cough x 6 months daily worst first thing in am takes 45 min to clear clear white occ yellow occ slt bloody and when head hits the pillow and all night long. neg ent w/u per pt. assoc with mild dysphagia.  Imp was uacs    rec 1) stop labetolol, amlodipine  2) Bystolic 10 mg one twice daily (take once daily if too sluggish or blood pressure too low  3) Nexium Take one 30-60 min before first meal of the day and Pepcid at bedtime  4) Reglan (metaclopramide) take one before each meal and at bedtime  5) Prednisone 4 each am x 2days, 2x2days, 1x2days and stop  6) GERD  diet  10/15/2010 ov 75% better at this point main c/o dry mouth attributes to clonidine, using mint gum to offset.  No excess mucus, noct cough or sob. Pt denies any significant sore throat, dysphagia, itching, sneezing,  nasal congestion or excess/ purulent secretions,  fever, chills, sweats, unintended wt loss, pleuritic or exertional cp, hempoptysis, orthopnea pnd or leg swelling.    Also denies any obvious fluctuation of symptoms with weather or environmental changes or other aggravating or alleviating factors.       Past Medical History:  CORONARY ARTERY DISEASE (ICD-414.00)  a. S/P Acute Inf. MI 05/2002, with PCI/Express BMS to RCA. OTW nonobs dzs and NL EF.  b. 11/2003 - cath - Nonobs dzs.  c.. 08/2007 - Adenosine MV: EF 49% (visually appeared better); Breast attenuation w/o ischemia.  PAROXYSMAL ATRIAL FIBRILLATION (ICD-427.31)  MYOCARDIAL INFARCTION, HX OF (ICD-412)  HYPERTENSION (ICD-401.9)  HYPERLIPIDEMIA (ICD-272.4)  DIASTOLIC CONGESTIVE HEART FAILURE  a. 06/2008, Echo: ef  60-70%.  RENAL FAILURE (ICD-586)- End-stage renal disease: M, W, F DIALYSIS  PERICARDIAL EFFUSION (ICD-423.9)  CEREBROVASCULAR ACCIDENT (ICD-434.91)  a. 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  Family History of Diabetes: multiple family members  Her family history is remarkable for coronary artery disease. Her  father died of a stroke in his 57s.  No FH of Colon Cancer:   Social History:  Retired  Married x 52 y 2010  Patient is a former smoker. Quit in 2003. Smoked for approx 30 yrs up to 1/4 ppd  Alcohol Use - no  Illicit Drug Use - no  Daily Caffeine Use 1 cup coffee       Review of Systems  Objective:   Physical Exam   Obese amb bf nad no longer freq throat clearing  wt 168 September 22, 2010  > 167 10/15/2010  HEENT: nl dentition, turbinates, and orophanx with no cobblestoning or evidence of excessive pnds. Nl external ear canals without cough reflex  NECK : without JVD/Nodes/TM/ nl carotid upstrokes bilaterally  LUNGS: no acc muscle use, clear to A and P bilaterally without cough on insp or exp maneuvers    CV: RRR no s3 or murmur or increase in P2, no edema  ABD: soft and nontender with nl excursion in the supine position. No bruits or organomegaly, bowel sounds nl  MS: warm without deformities, calf tenderness, cyanosis or clubbing  SKIN: warm and dry without lesions      Assessment & Plan:

## 2010-10-15 NOTE — Patient Instructions (Addendum)
Avoid anything with mint  Clonidine to one half tablet three times a day - this will help your dry mouth  Increase the bystolic to  20 mg per day and if your blood pressure is too low then stop the clonidine completely  Please schedule a follow up office visit in 6 weeks, call sooner if needed

## 2010-10-28 LAB — APTT: aPTT: 45 seconds — ABNORMAL HIGH (ref 24–37)

## 2010-10-29 LAB — PROTIME-INR
INR: 1.1 (ref 0.00–1.49)
Prothrombin Time: 14.7 seconds (ref 11.6–15.2)

## 2010-10-29 LAB — POCT I-STAT 4, (NA,K, GLUC, HGB,HCT)
Hemoglobin: 16 g/dL — ABNORMAL HIGH (ref 12.0–15.0)
Sodium: 139 mEq/L (ref 135–145)

## 2010-10-29 LAB — GLUCOSE, CAPILLARY: Glucose-Capillary: 87 mg/dL (ref 70–99)

## 2010-10-30 LAB — AFB CULTURE WITH SMEAR (NOT AT ARMC): Acid Fast Smear: NONE SEEN

## 2010-11-11 ENCOUNTER — Other Ambulatory Visit: Payer: Self-pay | Admitting: *Deleted

## 2010-11-11 MED ORDER — HYDROXYZINE HCL 25 MG PO TABS: 25.0000 mg | ORAL_TABLET | Freq: Two times a day (BID) | ORAL | Status: DC

## 2010-11-16 ENCOUNTER — Telehealth: Payer: Self-pay | Admitting: Internal Medicine

## 2010-11-16 NOTE — Telephone Encounter (Signed)
Ok to reduce dose to 10 mg daily - pulse of 40 is only a problem if bp is running low, not if it's on the high side still, so I wouldn't change the bystolic if bp high without an ov

## 2010-11-16 NOTE — Telephone Encounter (Signed)
Pt aware to reduce Bystolic back down to 10 mg daily and she will call if she notices elvation in her BP.

## 2010-11-16 NOTE — Telephone Encounter (Signed)
Per Inez Catalina at dialysis center, during the last two treatments the pt's heart rate has dropped into the 40's and the pt c/o feeling tired. By the end of her treatments her heart rate is back up in the 70's. Inez Catalina states the only change they have seen is the dosage in Bystolic. She is now on 20 mg daily. Pls advise.

## 2010-11-20 ENCOUNTER — Encounter: Payer: Self-pay | Admitting: Internal Medicine

## 2010-11-23 ENCOUNTER — Encounter: Payer: Self-pay | Admitting: Internal Medicine

## 2010-11-24 ENCOUNTER — Ambulatory Visit (INDEPENDENT_AMBULATORY_CARE_PROVIDER_SITE_OTHER): Payer: Medicare Other | Admitting: Internal Medicine

## 2010-11-24 ENCOUNTER — Encounter: Payer: Self-pay | Admitting: Internal Medicine

## 2010-11-24 VITALS — BP 142/60 | HR 77 | Temp 98.4°F | Ht 62.0 in | Wt 167.2 lb

## 2010-11-24 DIAGNOSIS — I10 Essential (primary) hypertension: Secondary | ICD-10-CM

## 2010-11-24 DIAGNOSIS — R05 Cough: Secondary | ICD-10-CM

## 2010-11-24 DIAGNOSIS — K117 Disturbances of salivary secretion: Secondary | ICD-10-CM

## 2010-11-24 DIAGNOSIS — I4891 Unspecified atrial fibrillation: Secondary | ICD-10-CM

## 2010-11-24 DIAGNOSIS — N189 Chronic kidney disease, unspecified: Secondary | ICD-10-CM

## 2010-11-24 DIAGNOSIS — L299 Pruritus, unspecified: Secondary | ICD-10-CM

## 2010-11-24 MED ORDER — CLONIDINE HCL 0.2 MG PO TABS: ORAL_TABLET | ORAL | Status: DC

## 2010-11-24 MED ORDER — METOCLOPRAMIDE HCL 10 MG PO TABS: ORAL_TABLET | ORAL | Status: AC

## 2010-11-24 MED ORDER — PREDNISONE (PAK) 10 MG PO TABS: ORAL_TABLET | ORAL | Status: AC

## 2010-11-24 NOTE — Assessment & Plan Note (Signed)
Cont w/same Rx  F/u w/Dr Melvyn Novas

## 2010-11-24 NOTE — Assessment & Plan Note (Signed)
Clonidine was being used for excess salivation but I see no evidence of this and it didn't help her upper airway symptoms so try now to taper off.    Each maintenance medication was reviewed in detail including most importantly the difference between maintenance and as needed and under what circumstances the prns are to be used.  Please see instructions for details which were reviewed in writing and the patient given a copy.  See instructions for specific recommendations which were reviewed directly with the patient who was given a copy with highlighter outlining the key components.

## 2010-11-24 NOTE — Progress Notes (Signed)
  Subjective:    [name], female    DOB: 1945/09/03, 65 y.o.   MRN: [id number]  HPI  61 yobf quit smoking in 2003 at [place] with no resp complaints at that point   September 22, 2010 1st pulmonary office eval cc indolent onset progressively worse cough x 6 months daily worst first thing in am takes 45 min to clear clear white occ yellow occ slt bloody and when head hits the pillow and all night long. neg ent w/u per pt. assoc with mild dysphagia.  Imp was uacs  rec 1) stop labetolol, amlodipine  2) Bystolic 10 mg one twice daily (take once daily if too sluggish or blood pressure too low  3) Nexium Take one 30-60 min before first meal of the day and Pepcid at bedtime  4) Reglan (metaclopramide) take one before each meal and at bedtime  5) Prednisone 4 each am x 2days, 2x2days, 1x2days and stop  6) GERD diet   10/15/2010 ov 75% better at this point main c/o dry mouth attributes to clonidine, using mint gum to offset. No excess mucus, noct cough or sob.  rec Avoid anything with mint  Reduce Clonidine to one half tablet three times a day - this will help your dry mouth  Increase the bystolic to 20 mg per day and if your blood pressure is too low then stop the clonidine completely  Please schedule a follow up office visit in 6 weeks, call sooner if needed   11/24/2010 ov/[name] cc  Acute onset minimally productive cough bad again x 2-3 weeks esp p eating and when lie down assoc with severe dysphagia,  Also wakes up at night with choking sensation/ sob whether HD that day or not, assoc with dysphagia despite max gerd rx and reglan rx.  Pt denies any significant sore throat, dysphagia, itching, sneezing,  nasal congestion or excess/ purulent secretions,  fever, chills, sweats, unintended wt loss, pleuritic or exertional cp, hempoptysis, orthopnea pnd or leg swelling.    Also denies any obvious fluctuation of symptoms with weather or environmental changes or other aggravating or alleviating  factors.     Review of Systems     Objective:   Physical Exam    Obese amb bf nad no longer freq throat clearing  wt 168 September 22, 2010 > 167 10/15/2010  > 167 11/24/2010  HEENT: nl dentition, turbinates, and orophanx with no cobblestoning or evidence of excessive pnds. Nl external ear canals without cough reflex  NECK : without JVD/Nodes/TM/ nl carotid upstrokes bilaterally  LUNGS: no acc muscle use, clear to A and P bilaterally without cough on insp or exp maneuvers  CV: RRR no s3 or murmur or increase in P2, no edema  ABD: soft and nontender with nl excursion in the supine position. No bruits or organomegaly, bowel sounds nl  MS: warm without deformities, calf tenderness, cyanosis or clubbing  SKIN: warm and dry without lesions      Assessment & Plan:

## 2010-11-24 NOTE — Assessment & Plan Note (Addendum)
Symptoms are markedly disproportionate to objective findings and not clear this is a lung problem but pt does appear to have difficult airway management issues.   This is most likley  Upper airway cough syndrome, so named because it's frequently impossible to sort out how much is  CR/sinusitis with freq throat clearing (which can be related to primary GERD)   vs  causing  secondary (" extra esophageal")  GERD from wide swings in gastric pressure that occur with throat clearing, often  promoting self use of mint and menthol lozenges that reduce the lower esophageal sphincter tone and exacerbate the problem further in a cyclical fashion.   These are the same pts who not infrequently have failed to tolerate ace inhibitors,  dry powder inhalers or biphosphonates or report having reflux symptoms that don't respond to standard doses of PPI , and are easily confused as having aecopd or asthma flares,   For now max gerd rx/ diet/ empiric max doses of reglan while taper off clonidine

## 2010-11-24 NOTE — Assessment & Plan Note (Signed)
On Rx 

## 2010-11-24 NOTE — Progress Notes (Signed)
  Subjective:    [name], female    DOB: 04/18/1946, 65 y.o.   MRN: [id number]  HPI  The patient presents for a follow-up of ESRD, chronic hypertension, chronic dyslipidemia, type 2 diabetes controlled with medicines    Review of Systems  Constitutional: Negative for chills.  HENT: Positive for nosebleeds and congestion.   Eyes: Negative for pain.  Respiratory: Positive for apnea, cough, choking, shortness of breath and wheezing.   Cardiovascular: Negative for leg swelling.  Gastrointestinal: Negative for abdominal distention.  Genitourinary: Negative for enuresis.  Musculoskeletal: Positive for back pain.  Neurological: Positive for weakness and light-headedness.  Psychiatric/Behavioral: Negative for dysphoric mood and decreased concentration.       Objective:   Physical Exam  Constitutional: She appears well-developed and well-nourished. No distress.  HENT:  Head: Normocephalic.  Right Ear: External ear normal.  Left Ear: External ear normal.  Nose: Nose normal.  Mouth/Throat: Oropharynx is clear and moist.  Eyes: Conjunctivae are normal. Pupils are equal, round, and reactive to light. Right eye exhibits no discharge. Left eye exhibits no discharge.  Neck: Normal range of motion. Neck supple. No JVD present. No tracheal deviation present. No thyromegaly present.  Cardiovascular: Normal rate, regular rhythm and normal heart sounds.   Pulmonary/Chest: No stridor. No respiratory distress. She has no wheezes.  Abdominal: Soft. Bowel sounds are normal. She exhibits no distension and no mass. There is no tenderness. There is no rebound and no guarding.  Musculoskeletal: She exhibits no edema and no tenderness.  Lymphadenopathy:    She has no cervical adenopathy.  Neurological: She displays normal reflexes. No cranial nerve deficit. She exhibits normal muscle tone. Coordination normal.  Skin: No rash noted. No erythema.  Psychiatric: She has a normal mood and  affect. Her behavior is normal. Judgment and thought content normal.          Assessment & Plan:   RENAL FAILURE-CHRONIC On HD  HYPERTENSION On Rx   PAROXYSMAL ATRIAL FIBRILLATION On Rx.  PRURITUS Better now  Hypersalivation - better Cont w/same Rx  F/u w/Dr Melvyn Novas

## 2010-11-24 NOTE — Assessment & Plan Note (Signed)
Better now 

## 2010-11-24 NOTE — Assessment & Plan Note (Signed)
On HD 

## 2010-11-24 NOTE — Patient Instructions (Signed)
Reduce clonidine to one half bfast and supper only  Increase the reglan (metachlorpramide) to 10 mg before each meal and at bedtime  Stay on the nexium before bfast and pepcid 20 mg one at bedtime  Prednisone 10 mg take  4 each am x 2 days,   2 each am x 2 days,  1 each am x2days and stop  Please schedule a follow up office visit in 2 weeks, sooner if needed with all active medications separated in two bags the ones you take no matter what vs only take as needed

## 2010-12-01 NOTE — H&P (Signed)
[name], WEHRLE NO.:  [id number]   MEDICAL RECORD NO.:  [id number]          PATIENT TYPE:  INP   LOCATION:  [place]                         FACILITY:  [facility]   PHYSICIAN:  Marletta Lor, MDDATE OF BIRTH:  07/12/1946   DATE OF ADMISSION:  05/03/2008  DATE OF DISCHARGE:                              HISTORY & PHYSICAL   HISTORY OF PRESENT ILLNESS:  The patient is a 65 year old African  American female with a history of end-stage renal disease.  She  presented to the ED with a 3-day history of chest pain.  She describes  pain in the left anterior chest area.  It initially began as a sharp  pain, but then became more of a dull, squeezing sensation.  This began  while at dialysis 3 days ago and was associated with some nausea.  This  has persisted intermittently throughout the day.  She again received  dialysis today, complaining of chest pain and was told to report to the  ED for evaluation.  She is also noted fever and some chills.  She states  that she has been somewhat weak and nervous.  There has been some  associated nausea and dry heaves and associated upper abdominal  discomfort.  In the ED, the patient was treated with IV morphine sulfate  with resolution of her pain.  Laboratory studies included a CBC that  revealed a white count of 19,000.  Chest x-ray revealed stable  cardiomegaly and no acute disease.  Temperature was as high as 101.7  degrees.  Chemistries were unremarkable with normal potassium and a  normal CO2 level, glucose was 73.   The patient has a history of coronary artery disease and is status post  PCI of the right coronary artery.  She was evaluated by [facility] recently preoperatively for nephrectomy for a renal tumor.  She had a normal Myoview study in February 2009 and Cardiology felt the  patient was stable preoperatively during the office visit of April 29, 2008.  She has had a 2-D echocardiogram in the past that  revealed normal  left ventricular function.  The patient is now admitted for further  evaluation and treatment of her chest pain and to further evaluate her  fever.   PAST MEDICAL HISTORY:  The patient has a history of type 2 diabetes and  end-stage renal disease, as mentioned prior history of coronary artery  disease, history of MI and PCI of the right coronary artery in the past.  She has hypertension, history of diastolic heart failure, and a history  of Helicobacter pylori gastritis.  Additionally medical illnesses  include gastroesophageal reflux disease, history of gout, and  obstructive sleep apnea.  She has treated hyperlipidemia and a history  of cerebrovascular disease, colonic polyps, and diverticulosis.  She is  in the process of being evaluated for a renal mass.   MEDICAL REGIMEN:  1. Colchicine 0.6 mg b.i.d.  2. Amlodipine 10 mg daily.  3. Lisinopril 10 mg daily.  4. Pravachol 40 mg daily.  5. Aspirin 81 mg daily.  6. Glimepiride 1  mg daily.  7. Labetalol 100 mg b.i.d.  8. Hectorol 0.5 mcg daily.  9. Aranesp.  10.Supplemental iron.  11.Zantac 150 daily.   SOCIAL HISTORY:  She is married, accompanied by her husband.  Nondrinker, nonsmoker.   FAMILY HISTORY:  Noncontributory.  Positive for renal failure and  hypertension.   PHYSICAL EXAMINATION:  GENERAL:  A well-developed, mildly overweight  female in no acute distress.  VITAL SIGNS:  Temperature 101.7, pulse rate 80, respiratory rate normal.  SKIN:  A vascular access catheter present involving the left anterior  chest wall.  The insertion site appeared clean without exudate,  erythema, or tenderness.  The patient had a well-healed incision  involving the right antecubital fossa from a prior fistula which was  slightly warm, but nontender and without erythema.  The patient has a  present vascular graft in the left antecubital fossa, this was quite  warm to touch with erythema and soft tissue swelling.  A  palpable thrill  was noted in antecubital loss and forearm area.  HEENT:  Normal pupil responses.  Conjunctivae clear.  ENT unremarkable.  NECK:  No adenopathy or bruits.  CHEST:  Rare scattered rhonchi.  CARDIOVASCULAR:  S1 and S2 normal.  No tachycardia, murmurs, or gallops.  ABDOMEN:  Mild epigastric tenderness.  The patient is status post  laparoscopic cholecystectomy and surgical scars also noted from a  umbilical hernia repair.  There is no significant tenderness or masses.  EXTREMITIES:  No edema.  Pedal pulses were not easily palpable.   IMPRESSION:  1. Atypical chest pain syndrome, doubt ischemic heart disease.  2. Fever, rule out soft tissue infections, infected graft, etc.   ADDITIONAL DIAGNOSES:  1. End-stage renal disease.  2. Diabetes.  3. Coronary artery disease.   DISPOSITION:  The patient will be admitted to a telemetry setting.  Enzymes will be cycled.  The patient will be seen in consultation by  Vascular Surgery to assess for evidence of vascular graft infection.  The patient will continue on Monday, Wednesday, Friday dialysis regimen.  Blood and urine cultures will be obtained.      Marletta Lor, MD  Electronically Signed     PFK/MEDQ  D:  05/04/2008  T:  05/04/2008  Job:  [id number]

## 2010-12-01 NOTE — Assessment & Plan Note (Signed)
[facility] OFFICE NOTE   YOCELYN, CORBAN                       MRN:          [id number]  DATE:04/29/2008                            DOB:          08/19/1945    Ms. Bresnan is a pleasant 65 year old female, who has a history of  coronary artery disease, status post PCI of the right coronary artery.  Her last Myoview was performed on August 29, 2007.  Her ejection  fraction was calculated at 49%, but visually appeared better.  There was  breast attenuation, but no ischemia.  Her last echocardiogram was  performed on April 03, 2007, and showed normal LV function.  There  was a small-to-moderate peripheral effusion which was unchanged from  previous.  Since I last saw her, she has had a vascular catheter placed  for dialysis.  She has also apparently been found to have a mass on her  kidney and scheduled for nephrectomy.  We were asked to evaluate prior  to surgery.  Note, she has dyspnea with more extreme activities, but now  with routine activities.  There is no orthopnea, PND, pedal edema,  palpitations, presyncope, or syncope.  She has not had any exertional  chest pain.   CURRENT MEDICATIONS:  1. Aspirin 81 mg p.o. daily.  2. Glimepiride 2 mg p.o. daily.  3. Amlodipine 10 mg p.o. daily.  4. Pravachol 40 mg p.o. daily.  5. Lisinopril 20 mg p.o. daily.  6. Aranesp.  7. Labetalol 100 mg p.o. daily.  8. Hectorol.  9. Multivitamin.   PHYSICAL EXAMINATION:  VITAL SIGNS:  Blood pressure of 144/84, pulse is  65.  She weighs 195 pounds.  HEENT:  Normal.  NECK:  Supple.  CHEST:  Clear.  CARDIOVASCULAR:  Regular rate and rhythm.  ABDOMEN:  No tenderness.  EXTREMITIES:  No edema.   Her electrocardiogram shows a sinus rhythm at a rate of 63.  There are  occasional PACs.  There are nonspecific ST changes.   DIAGNOSES:  1. Preoperative evaluation prior to nephrectomy - Ms. Villwock is doing      well  from symptomatic standpoint with no chest pain.  Her Myoview      earlier this year showed no ischemia.  I think it is safe for her      to proceed without further cardiac evaluation.  2. Coronary artery disease - she will continue with medical therapy to      include her aspirin, beta-blocker, statin, and ACE inhibitor.  3. 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  4. Hypertension - her blood pressure is adequately controlled on      present medications.  5. Hyperlipidemia - she will continue on her statin.  6. History of cerebrovascular disease - she will need followup carotid      Dopplers in July 2011.  7. Renal insufficiency.  8. History of sleep apnea.   We will see her back in 9 months.     Denice Bors [name], MD, [facility]  Electronically  Signed    BSC/MedQ  DD: 04/29/2008  DT: 04/30/2008  Job #: [id number]   cc:   Clent Jacks, MD@ [facility]

## 2010-12-01 NOTE — Assessment & Plan Note (Signed)
OFFICE VISIT   [name], [name]  DOB:  [birth year]                                       03/18/2008  [id number]   HISTORY:  This is [name] 65 year old female who underwent right upper arm  fistula and Diatek catheter placed on 02/28/2008.  She comes back in  today, her fistula has clotted.  She states that this happened  approximately 7 to 10 days ago.  The patient does not have any other  sites for fistula creation.  Since it has not been that long since it  occluded, I think it may be possible to reopen the fistula.  I also plan  on shooting [name] venogram to see there is any evidence of stenosis that  could be the etiology for her fistula to not stay functional.  I would  plan on treating this at the same time.  This has been scheduled for  Wednesday September 2.   Eldridge Abrahams, MD  Electronically Signed   VWB/MEDQ  D:  03/18/2008  T:  03/19/2008  Job:  [id number]

## 2010-12-01 NOTE — Discharge Summary (Signed)
[name], [name]                ACCOUNT NO.:  [id number]   MEDICAL RECORD NO.:  [id number]          PATIENT TYPE:  INP   LOCATION:  5528                         FACILITY:  [facility]   PHYSICIAN:  Valerie A. Asa Lente, MDDATE OF BIRTH:  09-22-1945   DATE OF ADMISSION:  06/19/2008  DATE OF DISCHARGE:  06/28/2008                               DISCHARGE SUMMARY   DIAGNOSES AT TIME OF DISCHARGE:  1. 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  Plan to discharge the patient home on      Coumadin which is new.  2. Recurrent Enterococcus bacteremia status post removal of infected      graft on May 07, 2008, with removal of PermCath this admission      and subsequent replacement.  3. End-stage renal disease on Monday, Wednesday, Friday hemodialysis.  4. Anemia of chronic disease, being continued on Aranesp, hemoglobin      stable.  5. Diabetes type 2.  6. History of coronary artery disease.  7. Hypertension.  8. Paroxysmal atrial fibrillation, plan to continue Coumadin, rate      currently stable.   HISTORY OF PRESENT ILLNESS:  [name] is a 65 year old female admitted  on June 19, 2008, with chief complaint of confusion and slurred  speech.  Apparently in the morning of admission, her neighbor noted that  she was slurring her speech and called the EMS.  She was, for some  reason not brought to the emergency department at that time.  She  continued to have slurred speech and drove herself to hemodialysis.  The  patient's husband came in after there and noted that she was very  confused and not making much sense, at which point he asked to stop the  dialysis and have her evaluated by hospital staff, at which point she  was brought to [facility] Emergency Department.  She was admitted for  further evaluation and treatment.   PAST MEDICAL HISTORY:  1. History of Enterococcus bacteremia in October 2009 secondary to AV      graft  infection status post removal of graft on May 07, 2008.  2. History of end-stage renal disease, on hemodialysis.  3. Anemia of chronic disease secondary to end-stage renal disease.  4. Hypertension.  5. Diabetes type 2.  6. History of renal mass, was apparently undergoing evaluation for      surgical resection.  7. Diverticulosis.  8. Colon polyps.  9. History of cerebrovascular disease.  10.Hyperlipidemia.  11.Obstructive sleep apnea.  12.Gout.  13.GERD.  14.History of H. pylori gastritis.  [id number] diastolic heart failure.  16.Coronary artery disease status post MI and PCI of her right      coronary artery in the past.   COURSE OF HOSPITALIZATION:  1. Left MCA infarct with expressive aphasia.  The patient was      admitted.  A chest x-ray was performed on admission, which showed      no acute intracranial abnormalities.  An MRI/MRA of the brain was      then pursued, and she  was noted to have an area of acute ischemia,      which was thought to be likely thromboembolic in etiology, mild-to-      moderate inflammatory reaction in the maxillary and ethmoid air      cells was also noted.  MRA suggested 50% stenosis in the right ICA      as well as some calvar irregularity involving the right ACA and the      left MCA trifurcation branches likely representing arteriosclerotic      changes.  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  The patient underwent a 2-D      echo performed during this admission on June 20, 2008, which      noted LV function of 60-70%.  This was followed by TEE, which noted      no vegetations.  She was seen in consultation by the stroke team      during this admission, Dr. Wyline Copas.  It was recommended by      their team that the patient be started on Coumadin, which she has      been, as there is no sign of vegetation on TEE.  She was also seen      in consultation by Dr. Jenell Milliner due  to elevated cardiac enzymes      on admission and it was felt that these changes were likely      secondary to chronic renal disease.  2. Recurrent Enterococcus bacteremia.  Blood cultures drawn on      June 20, 2008, grew enterococcus in 2/2 cultures.  It was felt      most likely the patient's PermCath was the source; however, cath      tip did not grow any enterococcus, it was removed on June 24, 2008.  She then had replacement of her PermCath performed on      June 26, 2008.  She is currently being receiving hemodialysis      through that PermCath without difficulty.   An infectious disease consult was requested.  The patient was seen by  Dr. Bobby Rumpf.  Plan at this time is for 6 weeks total of IV  antibiotics.  She will be discharged home on vancomycin and gentamicin.  Initially, they wished to place her on ampicillin in place of gent due  to autotoxicity risks of gentamicin.  However, home health was unable to  dose this at home, as she does not have a PICC line and cannot have one  placed in the setting of hemodialysis.  Therefore, it was decided that  the patient be sent home on gentamicin with very close outpatient  followup with Audiology.  Dr. Johnnye Sima recommended that they follow her  in approximately 2 weeks.  I have placed a call to Dr. Judeen Hammans  office and spoken to a nurse over there who will request a referral to  Audiology for followup in approximately 2 weeks.   MEDICATIONS AT TIME OF DISCHARGE:  1. Sensipar 30 mg p.o. daily.  2. Nephro-Vite 1 tablet p.o. daily.  3. Colchicine 0.6 mg p.o. b.i.d. as needed for gout.  4. Lisinopril 10 mg p.o. b.i.d.  5. Aspirin 81 mg p.o. daily.  6. Norvasc 10 mg p.o. daily.  7. Amaryl 1 mg p.o. b.i.d.  8. Labetalol 100 mg p.o. daily.  9. Carbachol 40 mg p.o. daily.  10.Vancomycin  to be dosed 3 times weekly at hemodialysis.  11.Gentamicin to be dosed at hemodialysis.  These antibiotics are to      be  continued times a 6-week total antibiotic course.  Please note,      vancomycin was started on June 20, 2008.  12.Coumadin 5 mg p.o. daily, new secondary to history of CVA and      paroxysmal atrial fibrillation.  This will be managed and dosed by      the renal team and Hemodialysis.  I confirmed this with Dr.      Moshe Cipro.  13.Fluoxetine 10 mg p.o. daily.   PERTINENT LABORATORY DATA:  At time of discharge, blood culture on  June 26, 2008, no growth to date x2.  BUN 52, creatinine 5.64, sodium  138, potassium 3.7, hemoglobin 10.3, INR 1.7.   FOLLOWUP:  The patient is to follow up with Dr. Lew Dawes on  Tuesday, July 02, 2008, at 4:15 p.m.  It will need to be confirmed  that the outpatient audiology referral has been completed at that time.  In addition, she is to continue hemodialysis on Monday, Wednesday,  Friday with IV antibiotic administration at hemodialysis.  She has been  maintained on a renal diet.    Greater than 30 minutes were spent on discharge planning.      Debbrah Alar, NP      Jannifer Rodney. Asa Lente, MD  Electronically Signed    MO/MEDQ  D:  06/28/2008  T:  06/28/2008  Job:  [id number]   cc:   [name]. Plotnikov, MD  Maudie Flakes. Hassell Done, M.D.

## 2010-12-01 NOTE — Op Note (Signed)
NAMECLORIA, [name] NO.:  [id number]   MEDICAL RECORD NO.:  [id number]          PATIENT TYPE:  INP   LOCATION:                               FACILITY:  [facility]   PHYSICIAN:  Jessy Oto. Fields, MD  DATE OF BIRTH:  04-01-1946   DATE OF PROCEDURE:  05/08/2008  DATE OF DISCHARGE:                               OPERATIVE REPORT   PROCEDURES:  1. Removal of left forearm arteriovenous graft.  2. Vein patch angioplasty of left brachial artery.   PREOPERATIVE DIAGNOSIS:  Infected left forearm arteriovenous graft.   POSTOPERATIVE DIAGNOSIS:  Infected left forearm arteriovenous graft.   ANESTHESIA:  General.   ASSISTANT:  Chad Cordial, PA-C   OPERATIVE FINDINGS:  1. Biofilm covered left forearm arteriovenous graft.  2. Intraoperative cultures.  3. Vein patch left brachial artery.   OPERATIVE DETAILS:  After obtaining informed consent, the patient was  taken to the operating.  The patient was placed in supine position on  the operating table.  After induction of general anesthesia, the  patient's entire left upper extremity was prepped and draped in usual  sterile fashion.  A transverse incision was made through a preexisting  scar in the antecubital crease.  Incision was carried down through  subcutaneous tissues down to the arterial and venous limbs of the graft.  There was a small pocket of purulent material around the proximal aspect  of the 4 mm end of the graft.  This was thoroughly irrigated with saline  solution.  The arterial limb of the graft was dissected free  circumferentially.  Dissection was carried down onto the level of the  brachial artery.  Vessel loops were placed proximal and distal to the  arterial anastomosis.  Next, the venous limb of the graft was dissected  free circumferentially.  Previous venous anastomosis had been end-to-  side.  The vein was dissected free proximal and distal to the venous  anastomosis.  Dissection then was carried  further up on the distal vein  in order to have a suitable piece of vein for vein patch of the artery.  The patient was then given 5000 units of intravenous heparin.  After  appropriate circulation time, the vessel loops were used to control the  brachial artery proximally and distally.  The arterial limb of the graft  was transected and the entire prosthetic removed from the brachial  artery.  Next, the distal vein above the anastomosis was ligated with a  2-0 silk tie.  The venous limb of the graft was then transected and the  prosthetic material removed from the vein.  A suitable piece of distal  vein was used as a vein patch.  This was then sewn on to the left  brachial artery as a vein patch angioplasty using a running 6-0 Prolene  suture.  Just prior to completion of anastomosis, this was fore bled,  back bled, and thoroughly flushed.  Anastomosis was secured.  Vessel  loops were released.  There is a palpable pulse in the brachial artery  and then radial and ulnar arteries immediately.  Hemostasis was  obtained.  The remainder of the graft was then removed with gentle  traction.  The graft was removed fairly easily and was covered in a  slimy-type biofilm covering.  The wound was then thoroughly irrigated  with normal saline solution.  The tunnel track was also thoroughly  irrigated with normal saline solution.  Subcutaneous tissues were then  closed with a running 3-0 Vicryl suture.  Skin was closed with  interrupted nylon  sutures.  The patient tolerated the procedure well and there were no  complications.  Instrument, sponge, and needle counts were correct at  the end of the case.  Cultures of the graft and of the purulent  collection were sent to the laboratory.  The patient was taken to  recovery room in stable condition.      Jessy Oto. Fields, MD  Electronically Signed     CEF/MEDQ  D:  05/08/2008  T:  05/09/2008  Job:  [id number]

## 2010-12-01 NOTE — Op Note (Signed)
NAMEMARIELLA, [name]                ACCOUNT NO.:  [id number]   MEDICAL RECORD NO.:  [id number]          PATIENT TYPE:  AMB   LOCATION:  SDS                          FACILITY:  [facility]   PHYSICIAN:  Dorothea Glassman, M.D.    DATE OF BIRTH:  October 15, 1945   DATE OF PROCEDURE:  09/17/2008  DATE OF DISCHARGE:  09/17/2008                               OPERATIVE REPORT   SURGEON:  Dorothea Glassman, MD   ASSISTANT:  Nurse.   ANESTHETIC:  Local with MAC.   PREOPERATIVE DIAGNOSIS:  End-stage renal failure.   POSTOPERATIVE DIAGNOSIS:  End-stage renal failure.   PROCEDURE:  Insertion of left upper arm loop arteriovenous graft.   OPERATIVE PROCEDURE:  The patient was brought to the operating room in  stable condition.  Placed in supine position.  Left arm prepped and  draped in sterile fashion.   Skin and subcutaneous tissue was instilled with 1% Xylocaine with  epinephrine.  A longitudinal skin incision made through the left axilla.  Dissection carried down through the subcutaneous tissue.  This was a  deep dissection down to expose the axillary vessels.  The largest  axillary vein was chosen.  This was freed and encircled with a vessel  loop.  The left axillary proximal brachial artery was then exposed and  also encircled with a vessel loop.   A subcutaneous loop tunnel was then made in left upper arm, aided by a  distal counter incision.  A 4 x 7 mm Gore-Tex graft placed through the  tunnel.   The left brachial artery was then controlled proximally and distally  with bulldog clamps.  A longitudinal arteriotomy made.  The 4-mm end of  the graft beveled and anastomosed end-to-side to the left brachial  artery using running 6-0 Prolene suture.  The vessel then flushed  through the graft and the graft controlled with a fistula clamp.  The  vein was then ligated distally with 2-0 silk and divided transversely.  Controlled proximally with a Gregory clamp.  The graft was divided and  anastomosed  end-to-end to the axillary vein using running 6-0 Prolene  suture.  Clamps were then removed.  Excellent flow present.  Adequate  hemostasis obtained.  Sponge and instrument counts correct.   The subcutaneous tissue was then closed in both incisions with running 3-  0 Vicryl suture.  Skin closed with 4-0 Monocryl.  Dermabond applied.   The patient tolerated the procedure well.  No apparent complications.  Transferred to recovery room in stable condition.      Dorothea Glassman, M.D.  Electronically Signed     PGH/MEDQ  D:  09/17/2008  T:  09/17/2008  Job:  [id number]

## 2010-12-01 NOTE — Assessment & Plan Note (Signed)
[facility] OFFICE NOTE   [name], [name]                       MRN:          [id number]  DATE:08/16/2007                            DOB:          [birth year]    [name] is a very pleasant 65 year old female who has a history of  coronary disease, who presents in followup and for preop evaluation  prior to cholecystectomy as well as being listed for a renal transplant.  The patient is status post PCI of her right coronary in 2001.  She had a  followup catheterization in 2005 that showed no obstructive disease and  preserved LV function.  Her most recent Myoview was performed in  December 2006 that showed normal perfusion and normal LV function.  Her  last echocardiogram in December 2007 showed an ejection fraction of 60%  with hypokinesis of the inferobasal wall with a small to moderate  pericardial effusion.  We repeated that when I last saw her and this was  performed on April 03, 2007.  Her LV function again was normal.  There was a small to moderate pericardial effusion.  She recently was  diagnosed with gallstones and is scheduled for a cholecystectomy and we  were asked to evaluate prior to that.  She also is being considered for  listing for a renal transplant.  Of note, she denies any increased  dyspnea on exertion, orthopnea, PND, pedal edema, palpitations, syncope  or chest pain.   MEDICATIONS:  1. Aspirin 81 mg daily.  2. Calcitriol 0.5 mg tablets two p.o. daily.  3. Labetalol 100 mg p.o. b.i.d.  4. Glimepiride 1-mg tablets, two p.o. daily.  5. Amlodipine 10 mg p.o. daily.  6. Pravachol 40 mg p.o. daily.  7. Lisinopril 10 mg p.o. daily.  8. Fluticasone sprays in the nostrils.  9. Multivitamin.  10.Aranesp.   PHYSICAL EXAM:  Her physical exam today shows a blood pressure of  164/100, but she has not taken her medications today.  Her pulse is 63.  She weighs 205 pounds.  HEENT:   Normal.  NECK:  Supple with no bruits.  CHEST:  Clear.  CARDIOVASCULAR:  Exam reveals a regular rate and rhythm.  ABDOMEN:  Exam shows no tenderness.  EXTREMITIES:  Show no edema.   ELECTROCARDIOGRAM:  Shows a sinus rhythm at a rate of 63.  There are  nonspecific ST changes.  A prior septal infarct cannot be excluded.   DIAGNOSES:  1. Coronary artery disease -- she has not had chest pain or increased      shortness of breath.  However, she is scheduled for cholecystectomy      and also is being evaluated for possible renal transplant.  They      would like a Myoview prior to her listing as possible transplant      recipient. We will schedule this.  She will otherwise continue with      her aspirin, beta blocker and ACE inhibitor, as well as statin.  2. History of small to moderate pericardial effusion -- this is  unchanged on her most recent echocardiogram.  3. Hypertension -- her blood pressure is elevated today, but she has      not taken any of her medications.  We will adjust this as needed in      the future.  4. Hyperlipidemia -- she will continue on her Pravachol.  Note:  Her      recent lipids and liver were outstanding in November.  5.  History      of cerebrovascular disease -- she will need followup carotid      Dopplers in July.  5. Renal insufficiency -- managed per Dr. Hassell Done.  6. History of sleep apnea.  7. Preoperative evaluation prior to cholecystectomy -- we will proceed      with a Myoview as described above.  I will see her back in 9      months.     Denice Bors [name], MD, [facility]  Electronically Signed    BSC/MedQ  DD: 08/16/2007  DT: 08/17/2007  Job #: [id number]   cc:   Odis Hollingshead, M.D.

## 2010-12-01 NOTE — Assessment & Plan Note (Signed)
OFFICE VISIT   [name], [name]  DOB:  28-Feb-1946                                       08/29/2008  [id number]   The patient returned to the office today for further scheduling of  dialysis access.  Had [name] left arm AV graft removed due to infection  May 08, 2008.  [name] recent left MCA infarct, felt to be thromboembolic  in nature, and placed on Coumadin.   She dialyzes Monday, Wednesday and Friday.   She has had [name] failed right upper arm arteriovenous fistula and an  infected left forearm loop AV graft removed.   BP is 159/90, pulse is 55 per minute.  The left upper extremity reveals  intact brachial and radial pulses.  Well-healed incisions.   Will plan placement of [name] left upper extremity AV graft September 17, 2008, at  [facility].  She will discontinue Coumadin for the  perioperative time from September 13, 2008.   P. Drucie Opitz, M.D.  Electronically Signed   PGH/MEDQ  D:  08/29/2008  T:  08/30/2008  Job:  [id number]

## 2010-12-01 NOTE — Op Note (Signed)
[name], [name]                ACCOUNT NO.:  [id number]   MEDICAL RECORD NO.:  [id number]          PATIENT TYPE:  AMB   LOCATION:  SDS                          FACILITY:  [facility]   PHYSICIAN:  Theotis Burrow IV, MDDATE OF BIRTH:  Mar 12, 1946   DATE OF PROCEDURE:  03/29/2008  DATE OF DISCHARGE:  03/29/2008                               OPERATIVE REPORT   PREOPERATIVE DIAGNOSIS:  End-stage renal disease.   POSTOPERATIVE DIAGNOSIS:  End-stage renal disease.   PROCEDURE PERFORMED:  Left forearm arteriovenous Gore-Tex graft.   SURGEON:  1. Leia Alf, MD   ASSISTANT:  Jacinta Shoe, PA   FINDINGS:  Small brachial and basilic vein at the elbow.  Vein accepted  a 4.5 dilator.   Venous limb is medial.  If this graft occludes early, recommend upper  arm graft.   PROCEDURE:  The patient was identified in the holding area and taken to  room 8 and placed supine on the table.  The left arm was prepped and  draped in standard sterile fashion.  A time-out was called.  Antibiotics  were given.  Lidocaine 1% was used for local anesthesia.  An incision  was made in the arm crease with #10 blade.  Cautery was used to dissect  the subcutaneous tissue.  Fascia was divided sharply with Metzenbaum  scissors.  The patient had an early brachial artery bifurcation.  Therefore, the ulnar and radial artery were within the field.  Both  measured approximately 3 mm in diameter.  I elected to use the radial  artery as her arterial donor site.  She had paired brachial veins which  appeared to be small in caliber measuring approximately 3 mm.  I looked  at her basilic vein with ultrasound and this did not appear to be any  bigger.  For that reason, I elected to proceed with a forearm graft.  A  counter incision was made in the distal forearm and subcutaneous tunnel  was created with a straight tunnel.  A 6-mm Gore-Tex graft stretch was  brought through the tunnel.  The patient was given  systemic  heparinization.  The arterial anastomosis was performed first.  The  artery was occluded with vascular clamps.  A 11-blade was used to make  an arteriotomy.  The graft was beveled and an end-to-side anastomosis  was performed to the radial artery using 6-0 Prolene.  Prior to  completion, the artery was flushed in antegrade and retrograde fashion.  Anastomosis was then secured and clamps were released.  There was good  flow through the graft.  The graft was then flushed with heparinized  saline and reoccluded.  Next, venous anastomosis was performed.  This  was an end-to-side anastomosis.  The brachial vein was opened with a #11  blade.  I was able to pass the sequential coronary dilators up to a 4.5  dilator.  These passed without resistance.  I then performed an end-to-  end anastomosis spatulating the graft as well as the vein.  This was  done with 6-0 Prolene.  Prior to completion, the graft  was flushed.  Anastomosis was then secured.  The patient did have a good thrill  through her graft at the completion of the procedure.  She also had  palpable radial pulse.  With this, I elected to terminate the procedure.  Heparin was reversed with 50 mg of protamine.  The subcutaneous tissue  was closed with 3-0 Vicryl, skin was closed with 4-0 Vicryl, and  Dermabond was placed.           ______________________________  V. Leia Alf, MD  Electronically Signed     VWB/MEDQ  D:  03/29/2008  T:  03/29/2008  Job:  [id number]

## 2010-12-01 NOTE — Consult Note (Signed)
NAMEDONASIA, SOBCZYK NO.:  [id number]   MEDICAL RECORD NO.:  [id number]          PATIENT TYPE:  INP   LOCATION:  5524                         FACILITY:  [facility]   PHYSICIAN:  Princess Bruins. Hickling, M.D.DATE OF BIRTH:  07-13-1946   DATE OF CONSULTATION:  DATE OF DISCHARGE:                                 CONSULTATION   CHIEF COMPLAINT:  Unable to speak.   HISTORY OF PRESENT CONDITION:  The patient was last known normal at 1:30  this morning when she was talking with her son.  Around 9 o'clock this  morning, she had slurred speech.  EMS was brought over to her home and  said that she was okay.  She drove to dialysis.  Her language worsened  and she was brought to the emergency room sometime after 4 o'clock.   I was not called for code stroke because of the length of time from  onset of her symptoms to presentation.   PAST MEDICAL HISTORY:  Is complicated and includes coronary artery  disease.  She had myocardial infarction in 2003.  She has had a  percutaneous right coronary artery angioplasty.  A 2D echocardiogram  shows normal left ventricular function recently.  She has a renal tumor  that was scheduled for nephrectomy on June 22, 2008.  She has end-  stage renal disease and is on dialysis, gastroesophageal reflux disease,  gout, colonic polyps, diverticulosis, and subacromial bursitis.  She had  resection of her turbinates.   Her risk factors for stroke include diabetes, dyslipidemia, obstructive  sleep apnea, sedentary lifestyle, obesity, and prior stroke.   MEDICATIONS:  1. Colchicine 0.6 mg twice daily.  2. Amlodipine 10 mg daily.  3. Lisinopril 10 mg daily.  4. Pravachol 40 mg in the evening.  5. Aspirin 81 mg daily.  6. Glimepiride 1 mg daily.  7. Labetalol 100 mg twice daily.  8. Hectorol 0.5 mcg daily.  9. Aranesp, dose unknown.  10.Ferrous sulfate, dose unknown.  11.Zantac 150 mg daily.   ALLERGIES:  Drug allergies none known.   FAMILY  HISTORY:  Positive for renal failure and hypertension.   SOCIAL HISTORY:  The patient is married.  She is not working.  She does  not use tobacco, alcohol, or drugs.   REVIEW OF SYSTEMS:  Neurologic review of systems is negative except as  noted above.   PHYSICAL EXAMINATION:  VITAL SIGNS:  Today, temperature 99.3, blood  pressure 170/87, resting pulse 78, respirations 20, and oxygen  saturation 97%.  HEAD, EYES, EARS, NOSE AND THROAT:  No infections.  No bruits.  She has  round and reactive pupils.  Visual fields are full to objects brought in  from the periphery.  She has mild right central seventh.  She has fairly  normal hearing.  Midline tongue and uvula  LUNGS:  Clear.  HEART:  No murmurs.  Regular sinus rhythm.  ABDOMEN:  Soft.  Protuberant.  Bowel sounds normal.  No  hepatosplenomegaly.  SKIN:  Unremarkable.  The patient is aphasic, expressive greater than  receptive.  NEUROLOGIC:  Motor examination, the patient  had well-preserved axial  strength in the arms and legs.  There is slight drift of the right leg,  but by and large, she has quite well preserved strength.  Her fine motor  skills are preserved in both right and left hand.  Sensation withdrawal  x4.  I cannot test her for stereognosis because of her aphasia.  Reflexes were absent.  The patient had bilateral flexor plantar  responses.  There is no cerebellar dysfunction in terms of tremor or  dysmetria.  NIH stroke scale was 7 at 9:25 p.m.  Her Modified Rankin  Score was 0-1.   LABORATORY STUDIES:  CT scan of the brain shows a large left middle  cerebral artery distribution infarction that is acute, nonhemorrhagic.  There is a subcortical left lacunar infarction, this is remote.  Her  risk factors for stroke have been noted above.  Sodium 140, potassium  4.0, chloride 105, CO2 of 28, BUN 38, creatinine 3.8, glucose 124.  White blood cell count 11,800, hemoglobin 13.2, hematocrit 40.5, and  platelet count  373,000.    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  434.11   PLAN:  We will carry out routine workup for this patient.  She was  admitted by the [facility] and will be followed by the  Stroke Service.   PROGNOSIS:  Guarded.   There is no specific intervention that is possible at this time given  the length of time between when she was last known normal and when she  presented.  This has been explained to the patient and her husband.      Princess Bruins. Gaynell Face, M.D.  Electronically Signed     WHH/MEDQ  D:  06/19/2008  T:  06/20/2008  Job:  [id number]

## 2010-12-01 NOTE — Assessment & Plan Note (Signed)
OFFICE VISIT   VANTASIA, SIGNS A  DOB:  July 21, 1945                                       03/02/2008  [id number]   REASON FOR VISIT:  Evaluate for dialysis access.   HISTORY:  This is a 65 year old female with end-stage renal disease not  yet on dialysis.  She comes in need of access.  She is scheduled to  undergo a nephrectomy for a renal mass, and needs to have dialysis prior  to this.   PAST MEDICAL HISTORY:  1. Hypertension, diabetes, coronary artery disease status post MI in      March 2003, asystole following nasal surgery in May 2005, and      history of small pericardial effusion.  2. Status post hysterectomy.  3. Gout.  4. Secondary hyper parathyroid.   MEDICATIONS:  Include labetalol Amaryl amlodipine, aspirin,  multivitamin, lisinopril, Aranesp, calcitriol, pravastatin.   FAMILY HISTORY:  Noncontributory.   SOCIAL HISTORY:  She is married.  Does not smoke.  Has a history of  smoking but quit 2003.   REVIEW OF SYSTEMS:  GENERAL:  Negative for fevers, chills weight gain,  weight loss.  CARDIAC:  Positive shortness of breath with exertion.  Positive heart  murmur.  PULMONARY:  Negative.  GI:  Has occasional constipation.  GU:  Renal disease, frequent urination.  VASCULAR:  Negative.  NEURO:  Negative.  ORTHO:  Positive for gallop.  PSYCH:  Negative.  ENT:  Positive for cataracts.  HEMATOLOGIC:  Positive for anemia.   ALLERGIES:  Bactrim and sulfa.   PHYSICAL EXAMINATION:  Blood pressure is 140/72, pulse of 68.  General:  She is in no acute distress.  Cardiovascular:  Regular rate and rhythm.  Pulmonary:  Respirations clear bilaterally.  Extremities reveal palpable  left and right brachial and radial pulses.   DIAGNOSTIC STUDIES:  Upper extremity vein mapping was performed.  She  has an adequate cephalic vein on the right.  By ultrasound, the depth of  his cephalic vein measured from 0.5 to 1 cm.   ASSESSMENT/PLAN:  Chronic  kidney disease needing dialysis.   PLAN:  I discussed proceeding with a right upper arm arteriovenous  fistula.  I have given her approximate 85% chance of maturity.  We also  discussed approximately 5% risk of steal.  I feel that her vein is of  adequate size and only concern is that it may be too deep to access.  By  ultrasound, however, I think it should be adequate, at this depth range  from 1 to 0.5 cm.  At the same time, she will also undergo a catheter  placement.  Told her I will place her catheter on the left side so as  not to compete with flow through the fistula.  This has been scheduled  for Wednesday August 12.   Eldridge Abrahams, MD  Electronically Signed   VWB/MEDQ  D:  02/19/2008  T:  02/20/2008  Job:  [id number]   cc:   Maudie Flakes. Hassell Done, M.D.

## 2010-12-01 NOTE — Op Note (Signed)
[name], [name]                ACCOUNT NO.:  [id number]   MEDICAL RECORD NO.:  [id number]          PATIENT TYPE:  AMB   LOCATION:  SDS                          FACILITY:  [facility]   PHYSICIAN:  Theotis Burrow IV, MDDATE OF BIRTH:  Dec 10, 1945   DATE OF PROCEDURE:  02/28/2008  DATE OF DISCHARGE:  02/28/2008                               OPERATIVE REPORT   PREOPERATIVE DIAGNOSIS:  End-stage renal disease.   POSTOPERATIVE DIAGNOSIS:  End-stage renal disease.   PROCEDURE PERFORMED:  1. Ultrasound access, left internal jugular vein.  2. Diatek catheter placement.  3. Right upper arm arteriovenous fistula.   TYPE OF ANESTHESIA:  MAC.   BLOOD LOSS:  Minimal.   COMPLICATIONS:  None.   FINDINGS:  Vein accepted a 4 dilator.   PROCEDURE:  The patient was identified in the holding area and taken to  room 6.  He was placed supine on the table.  The left neck and chest  were prepped and draped in standard sterile fashion.  The left internal  jugular vein was evaluated with ultrasound and was found to be widely  patent and easily compressible.  Lidocaine 1% was used for local  anesthesia.  The left internal jugular vein was accessed with an 18-  gauge needle.  An 0.35 wire was advanced into the inferior vena cava  under fluoroscopic visualization.  Sequential dilators were used to  dilate subcutaneous tract and a peel-away sheath was placed.  A 32-  [place] Diatek catheter was advanced to the peel-away sheath which was  then removed.  Catheter was measured for the location of skin exit site.  Lidocaine 1% was used for anesthesia.  A 11-blade was used to make skin  incision.  Subcutaneous tunnel was created and dilated.  Catheter was  pulled through the tunnel.  The cuff was situated at the skin exit site.  Both ports were flushed and aspirated without difficulty.  Fluoroscopy  was used to confirm that the catheter tip was at the cavoatrial  junction.  There were no kinks within the  catheter.  Catheter was  sutured in place with 3-0 nylon.  The incision in the neck was closed  with 4-0 Vicryl.  Sterile dressings were applied.   The patient was then set for right upper arm AV fistula.  The right arm  was prepped and draped in standard sterile fashion.  Ultrasound used to  determine the location of the cephalic vein.  A transverse incision was  made at the level of antecubital crease.  Cautery was used to dissect  the subcutaneous tissue.  The cephalic vein was identified within the  wound.  It was dissected free and encircled with vessel loop.  It was  then mobilized proximally and distally.  This marked for orientation.  Next, brachial artery was identified and mobilized proximally and  distally.  The artery was of adequate size approximately 4 mm.  At this  point in time, the patient was given 2000 units of heparin.  The vein  was ligated and the distal end tied with a 2-0 silk.  The  vein then was  spatulated.  It was dilated sequentially with coronary dilators from #2-  #4.  It was then flushed with heparinized saline.  There was good thrill  within the vein.  Next, the artery was occluded with serrefine vascular  clamps.  An #11 blade was used to make an arteriotomy.  This was  extended with Potts scissors.  The vein was then cut to the appropriate  length and the end was spatulated and an anastomosis was created in an  end-to-side fashion using running 6-0 Prolene.  Prior to completion of  anastomosis, artery was flushed in antegrade and retrograde fashion.  The clamps were then released.  There was a posterior branch that had  been cut during mobilization of the cephalic vein that required a figure-  of-eight 6-0 Prolene suture in the posterior aspect of the fistula.  At  this point, hemostasis was excellent.  There is a thrill within the  fistula up the shoulder.  There was a palpable pulse in the radial  artery.  The wound was irrigated.  The tissue was closed  with 3-0 and 4-  0 Vicryl.  Dermabond was placed.  The patient was taken to recovery room  in stable vision.  There were no complications.           ______________________________  V. Leia Alf, MD  Electronically Signed     VWB/MEDQ  D:  02/28/2008  T:  02/29/2008  Job:  [id number]

## 2010-12-01 NOTE — Assessment & Plan Note (Signed)
[facility] OFFICE NOTE   [name], [name]                       MRN:          [id number]  DATE:05/10/2007                            DOB:          September 22, 1945    REFERRING PHYSICIAN:  [name]. Plotnikov, MD   REASON FOR CONSULTATION:  Abdominal pain and nausea.   HISTORY:  This is a 65 year old African-American female with a history  of gastroesophageal reflux disease, coronary artery disease status post  myocardial infarction, hypertension, obstructive sleep apnea, and  adenomatous colon polyps.  The patient is now referred through the  courtesy of Dr. Alain Marion regarding abdominal pain.  Her last  surveillance colonoscopy was carried out July 01, 2005.  This was  normal, except for moderate sigmoid diverticulosis.  The patient reports  a 6 month history of problems with recurrent epigastric pain.  She  describes the pain as intermittent and lasting approximately 30 minutes.  During the day, the discomfort is described as annoying but tolerable.  At night, it is worse and causes her difficulty sleeping.  It is  associated with nausea, occasional vomiting in the morning, generally  bile.  There is some radiation into the back, and the pain does favor  the right side slightly.  She also describes increased bowel sounds.   OTHER MEDICAL PROBLEMS:  Include chronic anemia and worsening renal  function with a creatinine of approximately 3.9.  She is apparently  going to [facility] to be evaluated for possible renal  transplantation.  The patient does have intermittent reflux symptoms.  No dysphagia.  She denies lower abdominal pain.  She continues with  intermittent constipation, which is manageable.  No bleeding.  No  melena.  She describes decreased appetite, though no weight loss.   PAST MEDICAL HISTORY:  As above.   PAST SURGICAL HISTORY:  1. Hysterectomy with bilateral  salpingo-oophorectomy.  2. Tubal ligation.  3. Coronary artery stent placement.   ALLERGIES:  SULFA.   CURRENT MEDICATIONS:  1. Aspirin 81 mg daily.  2. Calcitriol 0.5 mg 2 daily.  3. Labetalol 100 mg b.i.d.  4. Colchicine 0.6 mg b.i.d.  5. Glimepiride 2 mg daily.  6. Amlodipine 10 mg daily.  7. Pravastatin 40 mg at night.  8. Lisinopril 10 mg daily.  9. Fluticasone spray.   FAMILY HISTORY:  Negative for gastrointestinal malignancy.   SOCIAL HISTORY:  The patient is married with 5 children, lives with her  husband.  She attended college for 3 years.  Has worked in Personal assistant  as a Freight forwarder.  Does not smoke or use alcohol.   REVIEW OF SYSTEMS:  Per diagnostic evaluation form.   PHYSICAL EXAM:  Well-appearing female in no acute distress.  Blood pressure 144/92, heart rate 56, weight 209 pounds.  She is 5 feet  4 inches in height.  HEENT:  Sclerae anicteric, conjunctivae pink, oral mucosa intact.  No  adenopathy.  LUNGS:  Clear.  HEART:  Regular.  ABDOMEN:  Obese and soft without tenderness, mass, or hernia.  Good  bowel sounds heard.  EXTREMITIES:  Without edema.  IMPRESSION:  1. A 6 month history of intermittent epigastric pain, rule out reflux      disease, rule out peptic ulcer, rule out gallbladder disease.  2. History of adenomatous colon polyps.  Surveillance up to date.  3. Multiple general medical problems including advancing renal failure      and coronary artery disease, and diabetes.   RECOMMENDATIONS:  1. Schedule upper endoscopy.  The nature of the procedure as well as      the risks, benefits, and alternatives have been reviewed.  She      understood and agreed to proceed.  2. Schedule abdominal ultrasound.  3. Ongoing general medical care with Dr. Alain Marion.     Docia Chuck. Henrene Pastor, MD  Electronically Signed    JNP/MedQ  DD: 05/10/2007  DT: 05/11/2007  Job #: [id number]   cc:   [name]. Plotnikov, MD

## 2010-12-01 NOTE — Op Note (Signed)
[name], [name]                ACCOUNT NO.:  [id number]   MEDICAL RECORD NO.:  [id number]          PATIENT TYPE:  AMB   LOCATION:  SDS                          FACILITY:  [facility]   PHYSICIAN:  Theotis Burrow IV, MDDATE OF BIRTH:  31-Dec-1945   DATE OF PROCEDURE:  03/20/2008  DATE OF DISCHARGE:  03/20/2008                               OPERATIVE REPORT   PREOPERATIVE DIAGNOSES:  End-stage renal disease.   POSTOPERATIVE DIAGNOSIS:  End-stage renal disease.   PROCEDURES PERFORMED:  1. Exploration of right upper arm arteriovenous fistula.  2. Fistulogram.   SURGEON:  1. Leia Alf, MD   ASSISTANT:  Jacinta Shoe, PA   ANESTHESIA:  MAC.   ESTIMATED BLOOD LOSS:  Minimal.   FINDINGS:  Sclerotic proximal portion of the cephalic vein fistula not  amendable to revision.   PROCEDURE:  The patient was identified in the holding area, taken to  room 6, and she was placed supine on the table.  Right arm was prepped  and draped in a standard sterile fashion.  A time-out was called.  Antibiotics were given.  Lidocaine 1% was used for local anesthesia and  using a #10 blade, the previous incision was opened.  A cautery was used  to dissect the subcutaneous tissue.  Sharp dissection was used to  isolate the cephalic vein fistula.  The fistula itself was isolated as  well as the radial artery proximal and distal to the anastomosis.  There  was a dense inflammatory reaction in this area.  Once the fistula and  the artery were individually isolated with vessel loops, the patient was  given systemic heparinization.  I made a transverse venotomy in the  cephalic vein slightly proximal to the anastomosis.  The vein was very  thickened with a significant amount of intimal hyperplasia.  I had  difficult time advancing a Fogarty through the fistula.  Thrombectomy  was performed.  Fogarty was passed and withdrawn, but no thrombus was  evacuated.  Next, a 5-French sheath was placed in  the cephalic vein and  a fistulogram was performed through the cephalic vein.  This revealed  that the proximal portion of the fistula was a very sclerotic vein,  approximately 1-1.5 mm.  I did not feel like based on this that there  were any options for revision.  At this point, I decided to close.  The  vein was oversewn proximally with 2-0 silk.  The arteriovenous  anastomosis was not taken  down but rather the vein was oversewn with 6-0 Prolene at the level of  the artery.  The wound was then irrigated.  Heparin was reversed with 50  mg of protamine.  Hemostasis was achieved.  The wound was closed with 3-  0 and 4-0 Vicryl.  Dermabond was placed.  There were no immediate  complications.           ______________________________  V. Leia Alf, MD  Electronically Signed     VWB/MEDQ  D:  03/20/2008  T:  03/21/2008  Job:  [id number]

## 2010-12-01 NOTE — Discharge Summary (Signed)
[name], [name]                ACCOUNT NO.:  [id number]   MEDICAL RECORD NO.:  [id number]          PATIENT TYPE:  INP   LOCATION:  70                         FACILITY:  [facility]   PHYSICIAN:  [name]. Plotnikov, MDDATE OF BIRTH:  1946-04-08   DATE OF ADMISSION:  05/03/2008  DATE OF DISCHARGE:  05/09/2008                               DISCHARGE SUMMARY   DISCHARGE DIAGNOSES:  1. Enterococcus bacteremia/sepsis secondary to arteriovenous graft      infection status post removal of graft on May 07, 2008.  2. End-stage renal disease continued on hemodialysis per renal team.  3. Atypical chest pain with negative cardiac enzymes.  4. Anemia of chronic disease secondary to end-stage renal disease.  5. Hypertension.  6. Diabetes type 2.   HISTORY OF PRESENT ILLNESS:  [name] is a 65 year old African  American female admitted on May 03, 2008 with 3-day history of chest  pain.  She described her chest pain in left anterior chest area, which  initially began as sharp and then became more of a dull, squeezing  sensation.  It was also associated with some nausea.  She was noted to  have leukocytosis on admission and was febrile as high as 101.7.  She  was admitted for further evaluation and treatment.   PAST MEDICAL HISTORY:  1. Diabetes type 2.  2. End-stage renal disease.  3. Coronary artery disease with history of MI and PCI  of the right      coronary artery in the past.  4. Hypertension.  5. Chronic diastolic heart failure.  6. History of H. pylori gastritis.  7. GERD.  8. Gout.  9. Obstructive sleep apnea.  10.Hyperlipidemia.  11.History of cerebrovascular disease.  12.Colonic polyps.  13.Diverticulosis.  14.Being evaluated for renal mass.   COURSE OF HOSPITALIZATION:  Enterococcus bacteremia/sepsis.  The patient  was admitted.  Blood cultures were drawn, which grew enterococcus in 2/2  bottles.  She was initially treated with IV Ceptaz and vancomycin.  She  was  seen by [facility], and it was felt that her left  AV graft was infected.  This was subsequently removed on May 07, 2008.  She has been receiving hemodialysis via Diatek catheter.  At this  time, plan is to continue IV vancomycin and hemodialysis for an  additional 3 weeks.  She is currently afebrile and wound cultures from  May 07, 2008 remain no growth to date.   Also of note, the patient had some paroxysmal atrial fibrillation, which  converted spontaneously to normal sinus rhythm.  She is not currently on  Coumadin.  It was felt that this was likely due to stress of the  enterococcus bacteremia/sepsis.  She will be continued on aspirin at the  time of discharge.   MEDICATIONS AT THE TIME OF DISCHARGE:  1. Venofer 100 mg IV every Monday at dialysis.  2. Labetalol 100 mg p.o. daily.  3. Glimepiride 1 mg p.o. b.i.d.  4. Pravastatin 40 mg p.o. daily.  5. Amlodipine 10 mg p.o. daily.  6. Lisinopril 10 mg p.o. b.i.d.  7.  Hectorol 0.5 mcg p.o. daily.  8. Bayer Aspirin 81 mg p.o. daily.  9. Renal vitamin 1 tablet p.o. daily.  10.Colchicine 0.6 mg p.o. daily.  11.Zantac 150 mg p.o. as needed.  12.Hectorol 5 mcg IV Monday, Wednesday, and Friday at hemodialysis.  13.Aranesp 100 mcg IV Mondays at hemodialysis.   DISPOSITION:  The patient will be discharged to home with outpatient  hemodialysis as prior to admission.   PERTINENT LABORATORY DATA:  At time of discharge; BUN 61, creatinine  6.55, hemoglobin 10.1, and hematocrit 34.6.  Blood cultures May 04, 2008, Enterococcus 2/2.   FOLLOWUP:  The patient is to follow up with Dr. Lew Dawes in 1-2  weeks and contact the office for an appointment.  She is also instructed  to follow up with Dr. [name] in 2 weeks for suture removal and is to  continue with hemodialysis as scheduled.   Greater than 30 minutes were spent on discharge planning.      Debbrah Alar, NP      [name].  Plotnikov, MD  Electronically Signed    MO/MEDQ  D:  05/09/2008  T:  05/09/2008  Job:  [id number]   cc:   Jessy Oto. [name], MD  Maudie Flakes. Hassell Done, M.D.

## 2010-12-01 NOTE — Assessment & Plan Note (Signed)
OFFICE VISIT   [name], [name]  DOB:  06/28/1946                                       05/21/2008  [id number]   I saw the patient in the office today for her followup after removal of  an infected left forearm AV graft.  She came in to have her sutures  removed.  Her antecubital incision looks fine and she had a small amount  of swelling at the distal incision but I have reassured her that this  looked normal.  There is no erythema or drainage.  She is dialyzing via  her catheter.  Once this is all healed we can arrange for placement of  new access.   Judeth Cornfield. Scot Dock, M.D.  Electronically Signed   CSD/MEDQ  D:  05/21/2008  T:  05/23/2008  Job:  [id number]

## 2010-12-01 NOTE — Consult Note (Signed)
[name], [name]                ACCOUNT NO.:  [id number]   MEDICAL RECORD NO.:  [id number]          PATIENT TYPE:  INP   LOCATION:  5524                         FACILITY:  [facility]   PHYSICIAN:  Thomas C. Wall, MD, FACCDATE OF BIRTH:  [birth year]   DATE OF CONSULTATION:  DATE OF DISCHARGE:                                 CONSULTATION   We were asked by Dr. Asa Lente in the [facility] Hospitalist Team to consult  on [facility] with increased cardiac enzymes.   [name] is a 65 year old African American female followed by Dr.  [name] of [facility].  She has a history of coronary artery  disease and end-stage renal disease on hemodialysis.   She was noted today to have some memory problems on hemodialysis.  She  was sent to the emergency room.  She has had some difficulty expressing  herself and is having trouble with her memory.   In the emergency room, her EKG showed no acute changes.  However, her  troponin was 0.29 and MB was normal.  There were some point-care  markers.  Repeat troponin was 0.16.   She denies any chest pain or ischemic symptoms.  She had been very  stable with her coronary disease.   PAST MEDICAL HISTORY:  She is allergic to BACTRIM and SULFA.   Her current meds are Sensipar 30 mg p.o. daily, Tums 2 t.i.d., oxycodone  p.r.n., Nephro-Vite daily, Zantac p.r.n., colchicine 0.6 p.r.n.,  lisinopril 10 mg p.o. b.i.d., aspirin 81 mg a day, Norvasc 10 mg a day,  Amaryl 1 mg per day, labetalol 100 mg a day, Pravachol 40 mg a day.   She has had a previous inferior wall infarct in 2003.  She has had a  stent to the right coronary artery at that time.  She had normal left  ventricular systolic function.  She is status post asystolic arrest  after an ENT procedure in 2005.  Catheter at that time showed  nonobstructive disease with EF of 60%.   She has a history of diastolic congestive heart failure.   Her last Myoview was in February 2009.  It showed no  ischemia, EF of  49%.  EF by echo in 2009 was 60%.   SURGICAL HISTORY:  She has had removal of a left AV graft.  She has had  2 caths.  She had a right AV fistula.  She has had a cholecystectomy,  nasal septal surgery, tonsillectomy, colonoscopy.   SOCIAL HISTORY:  Lives in [place] with her husband.  She does not  smoke or drink.  She is a housewife.   Her family history is remarkable for coronary artery disease.  Her  father died of a stroke in his 27s.   REVIEW OF SYSTEMS:  Other than the HPI is negative.  All systems  questioned.   PHYSICAL EXAMINATION:  VITAL SIGNS:  Her blood pressure was initially  170/87, her pulse is 78 and regular, temperature is 99, and respiratory  rate is 20 and unlabored, sat was 97% on room air.  GENERAL:  She is well developed,  very pleasant, anxious, having trouble  speaking.  HEENT:  Nonfocal and unremarkable.  NECK:  Remarkable for soft right carotid bruit.  Thyroid is not  enlarged.  Trachea is midline.  LUNGS:  Clear to auscultation and percussion.  HEART:  Normal S1 and S2.  No S4.  ABDOMEN:  Soft.  Good bowel sounds.  No tenderness.  EXTREMITIES:  No cyanosis, clubbing, or edema.  Pulses were present.  NEUROLOGIC:  She is alert, oriented, having difficulty writing and  speaking, and she says her memory is really hard.  MUSCULOSKELETAL:  Unremarkable.   CT of the head showed no acute changes.  Chest x-ray shows some  decreased lung volumes, but otherwise only mild cardiomegaly.  There is  no obvious heart failure.  Her EKG shows no acute changes, sinus rhythm,  or sinus tach on telemetry.   ASSESSMENT:  1. Mild increase in troponins with a negative MB by point-of-care-      markers.  She has no acute EKG changes.  She has had no symptoms of      coronary ischemia or an acute coronary syndrome.  I suspect this is      secondary to her chronic renal disease.  2. Cerebrovascular accident.   PLAN AND RECOMMENDATION:  1. Continue  current cardiac meds.  2. Cycle enzymes.  3. Keep on telemetry.  4. We will check a 2-D echocardiogram to rule out any cardioembolic      source for her stroke, but also make sure she has no change in      ejection fraction or wall motion abnormalities.   Thank you very much for consultation.      Thomas C. Verl Blalock, MD, [facility]  Electronically Signed     TCW/MEDQ  D:  06/20/2008  T:  06/20/2008  Job:  [id number]

## 2010-12-01 NOTE — Assessment & Plan Note (Signed)
OFFICE VISIT   [name], [name] A  DOB:  [birth year]                                       08/01/2008  [id number]   The patient underwent removal of an infected left arm AV graft  05/08/2008 at [facility].   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   She dialyzes Monday, Wednesday and Friday.   Right arm duplex scan does reveal a potentially adequate right cephalic  vein for AV fistula creation.  She very much would like to have an AV  fistula if possible.   Blood pressure is 148/99, pulse is 63 per minute.   Due to her recent CVA and only 1 month of Coumadin therapy I would be  more comfortable waiting another month or so prior to placing a  permanent access in her.  I will make her an appointment to see me in 1  month and at that time plan placement for permanent access.   Dorothea Glassman, M.D.  Electronically Signed   PGH/MEDQ  D:  08/01/2008  T:  08/02/2008  Job:  [id number]   cc:   Windy Kalata, M.D.

## 2010-12-01 NOTE — H&P (Signed)
[name], [name] NO.:  [id number]   MEDICAL RECORD NO.:  [id number]          PATIENT TYPE:  INP   LOCATION:  5524                         FACILITY:  [facility]   PHYSICIAN:  Farris Has, MDDATE OF BIRTH:  1946-04-13   DATE OF ADMISSION:  06/19/2008  DATE OF DISCHARGE:                              HISTORY & PHYSICAL   PRIMARY CARE PHYSICIAN:  [name]. Plotnikov, M.D.   CHIEF COMPLAINT:  Confusion and slurred speech.   HISTORY OF PRESENT ILLNESS:  The patient is a 65 year old female with  past medical history significant for diabetes and end-stage renal  disease on hemodialysis, history of coronary artery disease and MI.  The  patient was at her baseline of health up until this morning when her  neighbor noted that she was slurring her speech.  EMS was called. Her  vitals were negative.  Apparently they did not take her to the emergency  department at that time.  The patient continued to have slurred speech  and drove herself to hemodialysis.  Her husband came and met her there  and it was noted that she was very confused, was  not making sense, was  forgetting things, not able to form __________ sentences. At which point  he asked them to stop the dialysis and have her evaluated by hospital at  which point she was brought to [facility].  Here it was noted that she  has slight elevation of her troponin. CT scan of her head was initially  negative.  Cardiology was initially consulted but felt that this was  likely noncardiac in etiology.  Again the patient was not having any  chest pain or shortness of breath.  And had EKG which was pretty much  unremarkable at this point.  [facility] was called for admission  for evaluation.  Of note, neurology was also made aware of the patient's  presence as well as renal.   REVIEW OF SYSTEMS:  The patient denies any chest pain, shortness of  breath,. No headaches.  She is unable to provide her own review  of  systems and somewhat confused and has a hard time verbalizing.   PAST MEDICAL HISTORY:  1. History of Enterococcus bacteremia in October secondary to      arteriovenous graft infection, status removal of graft May 07, 2008.  2. History of end-stage renal disease on hemodialysis.  3. Typical chest pain, negative cardiac enzymes.  4. Anemia of chronic disease secondary to end-stage renal disease.  5. Hypertension.  6. Diabetes.  7. The patient also states that she is supposed to have one of her      kidneys taken out secondary to renal mass and she was supposed to      undergo this in the next few weeks.  8. Diverticulosis.  9. Colonic polyps.  10.History of cerebrovascular disease.  11.Hyperlipidemia.  12.Obstructive sleep apnea.  13.Gout.  14.Gastroesophageal reflux disease.  15.History of H pylori gastritis.  [id number] diastolic heart failure.  17.History of coronary artery disease, status post MI and PCI of  her      right coronary artery in the past.   SOCIAL HISTORY:  The patient never smoked or drank.  She lives at home.  Has a very supportive husband.   FAMILY HISTORY:  Significant for a brother who died.  He was on  hemodialysis.  There is some diabetes in the family as well and  hypertension.   PHYSICAL EXAMINATION:  VITAL SIGNS:  Temperature 99.3, blood pressure  161/57, pulse 64, respirations 18, oxygen saturation 99% on room air.  GENERAL:  The patient is obese female, sitting in bed, somewhat tearful.  Alert.  Appears to be oriented in the sense that she knows where she is  but not oriented to her situation, unable to verbalize appropriate  answers.  HEENT:  Head atraumatic.  Mucous membranes moist. No JVD noted.  LUNGS:  Clear to auscultation bilaterally, but somewhat distant breath  sounds.  HEART:  Regular rate and rhythm.  No murmurs could be appreciated.  ABDOMEN:  Soft, nontender, nondistended.  EXTREMITIES:  Lower extremities without  clubbing, cyanosis or edema.  NEUROLOGIC:  Cranial nerves II-XII intact except there is a questionable  slight droop on right side.  Strength 5 out of 5 in four extremities.  Otherwise neurologically intact.  Unable to cooperate fully with exam  though.   LABORATORY DATA:  White blood cell count 8, hemoglobin 14.6.  Sodium  140, potassium 4, creatinine 3.8.  CK 111, MB 3.3, troponin 0.3.  CT  scan of the head  shows no acute changes.  Chest x-ray shows  cardiomegaly but no infiltrates.  EKG shows normal sinus rhythm with  marked sinus arrhythmia and no evidence of ischemia.   ASSESSMENT/PLAN:  This is a 65 year old female with complicated medical  history, presents with mental status changes and inability to make up  words.  1. Acute mental status changes.  I am wondering if this possibly is a      cerebrovascular accident.  The patient has had history of      cerebrovascular disease in the past and apparently is a      vasculopath.  Also end-stage renal disease increases her risk for      strokes.  Appreciate neurology input.  Will order MRI/MRA of the      brain, carotid Dopplers, 2-D echo, homocysteine level, hemoglobin      A1c, TSH, fasting lipid panel.  Will do swallow evaluation, check      orthostatics.  Check ABG as the patient has history of sleep apnea      to rule out hypercarbia.  The patient may be at risk for      hypoventilation being somewhat heavyset.  2. Elevated troponin.  The patient has history of coronary artery      disease.  Is not complaining of chest pain.  Does not have EKG      changes.  Elevated troponin could be related to possible      cerebrovascular accident or could be just poor clearance secondary      to end-stage renal disease, but will cycle cardiac enzymes.      Cardiology is going to see the patient given that she does have      history of coronary artery disease.  3. Diabetes.  Hold Amaryl.  Continue sliding scale insulin.  4. Hypertension.   Hold amlodipine while could not rule out      cerebrovascular accident.  Continue labetalol to prevent  tachycardia rebound.  5. History of sepsis.  Currently does not appear to be infected though      there is slight elevation of white blood cell count.  Will obtain      blood cultures and monitor closely.  Will obtain 2-D echo to see if      there is any evidence of valvular disease which could predispose      her endocarditis and potential septic emboli, but again all this is      on differential.  6. Gastroesophageal reflux disease.  Hold the Protonix.  7. Prophylaxis.  SCDs plus Protonix.   Dr. Gwendolyn Grant will assume care in the a.m.      Farris Has, MD  Electronically Signed     AVD/MEDQ  D:  06/19/2008  T:  06/20/2008  Job:  [id number]   cc:   Thomas C. Verl Blalock, MD, [name]  Dr. Denzil Hughes V. Plotnikov, MD

## 2010-12-01 NOTE — Op Note (Signed)
[name], [name] [name] NO.:  [id number]   MEDICAL RECORD NO.:  [id number]          PATIENT TYPE:  OIB   LOCATION:  5121                         FACILITY:  [facility]   PHYSICIAN:  Odis Hollingshead, M.D.DATE OF BIRTH:  Feb 24, 1946   DATE OF PROCEDURE:  09/04/2007  DATE OF DISCHARGE:  09/05/2007                               OPERATIVE REPORT   PREOPERATIVE DIAGNOSIS:  Symptomatic cholelithiasis.Marland Kitchen   POSTOPERATIVE DIAGNOSES:  1. Symptomatic cholelithiasis..  2. Small umbilical hernia.   PROCEDURE:  Laparoscopic cholecystectomy with intraoperative  cholangiogram and repair of small umbilical hernia.   SURGEON:  Odis Hollingshead, M.D.   ASSISTANT:  Orson Ape. Rise Patience, M.D.   ANESTHESIA:  General.   INDICATION:  [name] [name] is a 65 year old female who has end-stage renal  disease and is trying to get on the transplantation list.  She had been  having some upper abdominal and right upper quadrant discomfort.  She  was noted to have cholelithiasis.  Because of her symptomatic  cholelithiasis, she now presents for elective cholecystectomy.  The  procedure and risks were discussed with her preop.   TECHNIQUE:  She was seen in the holding area, then brought to the  operating room and placed supine on the operating table and a general  anesthetic was administered.  Her abdominal wall was sterilely prepped  and draped.  She had a previous small subumbilical incision.  Marcaine  solution was infiltrated in the subumbilical region.  The previous  incision was incised through the skin and subcutaneous tissue.  I  identified some fascia and made a small incision in the fascia.  She  appeared to have a small fascial defect consistent with a small hernia  with some omentum and preperitoneal fat out in it.  I went ahead and  excised some of the fatty tissue, then reduced the other part of the  fatty tissue.  I placed a pursestring suture then around the fascial  edges.  A  Hassan trocar was introduced into the peritoneal cavity and  pneumoperitoneum was created by insufflation of CO2 gas.   Next an 11-mm trocar was placed through a similar-sized incision in the  epigastrium and two 5-mm trocar was were placed into the right mid  lateral abdomen.  I visualized the area around the Hasson trocar and  noted a number of omental adhesions, which were taken down bluntly  around the umbilical region.  Following this the fundus of the  gallbladder was identified, grasped and retracted toward the right  shoulder.  The infundibulum was then mobilized with blunt dissection,  staying close to the gallbladder.  I then identified the cystic duct and  created a window around it.  There was a little bit of bleeding from a  posterior cystic artery, which was controlled with clips.  I identified  the anterior cystic artery and created a window around it as well.  It  was subsequently clipped.  I placed a clip at the gallbladder-cystic  duct junction and made a small incision in the cystic duct and then  milked the bile back  but there were no stones coming back from the  cystic duct.  A cholangiocatheter was then placed into the anterior  abdominal wall and a cholangiogram was performed.   Under real-time fluoroscopy dilute contrast was injected to the cystic  duct, which was of moderate length.  The common hepatic, right and left  hepatic, and common bile ducts all filled with contrast promptly.  The  contrast drained promptly into the duodenum as well.  There was no  obvious evidence of obstruction.  Final report is pending the  radiologist's interpretation.   The cholangiocatheter was then removed and the cystic duct was clipped  three times proximally and divided.  I then divided the anterior branch  of the cystic artery.  The posterior branch of the cystic artery was  further identified and another clip placed on it.  It was hemostatic.   I dissected the gallbladder  free from the liver using electrocautery.  Bleeding areas on the liver were controlled with the electrocautery as  well as Surgicel.  The perihepatic area was then copiously irrigated and  fluid evacuated.  Hemostasis was adequate and there was no bile leak.  The gallbladder was placed in an Endopouch bag and then removed through  the subumbilical port.   I then closed the fascial defect and hernia at the subumbilical area by  tightening up and tying down the pursestring suture.  There was some  bleeding coming from the omentum in this area and this was controlled  with electrocautery.   Following this the trocars were removed and the CO2 gas was released.  The skin incisions were closed with 4-0 Monocryl subcuticular stitches.  Steri-Strips and sterile dressings were applied.   She tolerated the procedures well without any apparent complications and  was taken to the recovery room in satisfactory condition.      Odis Hollingshead, M.D.  Electronically Signed     TJR/MEDQ  D:  09/04/2007  T:  09/05/2007  Job:  [id number]   cc:   [name]. Plotnikov, MD  Denice Bors. [name], MD, [facility]  Docia Chuck. Henrene Pastor, MD

## 2010-12-01 NOTE — Procedures (Signed)
CEPHALIC VEIN MAPPING   INDICATION:  End-stage renal disease.   HISTORY:  End-stage renal disease.   EXAM:  Bilateral cephalic vein duplex.   The right cephalic vein is compressible.   Diameter measurements range from 0.26 to 0.30.   The left cephalic vein is compressible.   Diameter measurements range from 0.15 to 0.24.   See attached worksheet for all measurements.   IMPRESSION:  Patent right cephalic vein which is of acceptable diameter  for use as a dialysis access site.   ___________________________________________  V. Leia Alf, MD   MG/MEDQ  D:  02/19/2008  T:  02/20/2008  Job:  [id number]

## 2010-12-01 NOTE — Assessment & Plan Note (Signed)
[place] OFFICE NOTE   NATANE, DISANTIS                       MRN:          [id number]  DATE:02/24/2007                            DOB:          Apr 27, 1946    Mrs. [name] is a very pleasant female who has a history of coronary  artery disease with prior PCI of her right coronary artery in 2001.  She  had a followup catheterization in 2005.  Her ejection fraction was 60%.  There was no obstructive coronary disease noted.  Her most recent  Myoview was performed in December of 2006.  At that time, she had a  normal perfusion and normal left ventricular function.  An  echocardiogram was performed on June 27, 2006 that showed an  ejection fraction of 60% with hypokinesis of the inferior basal wall.  There was a small to moderate pericardial effusion.  This was unchanged  from previous.  Since I last saw her, she has some dyspnea on exertion,  but there is no orthopnea, PND, and no pedal edema.  She has not had  chest pain, palpitations, or syncope.  Note, her renal function has  deteriorated and she is now seeing Dr. Hassell Done.   PRESENT MEDICATIONS:  1. Amlodipine 10 mg p.o. daily.  2. Glimepiride 1 mg p.o. daily.  3. Aspirin 81 mg p.o. daily.  4. Spironolactone 50 mg p.o. daily.  5. Lisinopril 10 mg p.o. daily.  6. Calcitriol and labetalol 100 mg p.o. b.i.d.   PHYSICAL EXAM:  Blood pressure 155/82, pulse 62.  She weighs 206 pounds.  HEENT:  Normal.  NECK:  Supple with no bruits.  CHEST:  Clear.  CARDIOVASCULAR:  Regular rate and rhythm.  ABDOMEN:  benign.  EXTREMITIES:  No edema.   ELECTROCARDIOGRAM:  Sinus rhythm at a rate of 60.  The axis is normal.  There are occasional PACs.  There were no ST changes noted.   DIAGNOSES:  1. Coronary artery disease - she has had no chest pain and her most      recent Myoview showed normal perfusion.  We will continue with      medical therapy, including her  aspirin, beta blocker, ACE      inhibitor, and calcium blocker.  2. History of small pericardial effusion - we will plan to repeat her      echocardiogram both to quantify her left ventricular function and      to further evaluate.  3. Hypertension - she will need to continue to track her blood      pressure and we will adjust her medications as indicated.  The      labetalol could be increased in the future.  4. Hyperlipidemia - she did not tolerate simvastatin or Crestor.  We      will begin Pravachol 20 mg p.o. daily to see if she tolerates.  If      so, we will check lipids and liver in 6 weeks.  5. History of renal insufficiency - this is being followed by Dr. Hassell Done.  6. History of sleep  apnea - per her primary care physician.  We will      see her back in approximately 6 months.     Denice Bors [name], MD, [facility]  Electronically Signed    BSC/MedQ  DD: 02/24/2007  DT: 02/25/2007  Job #: [id number]

## 2010-12-03 ENCOUNTER — Encounter: Payer: Self-pay | Admitting: Adult Health

## 2010-12-04 NOTE — Assessment & Plan Note (Signed)
[place] OFFICE NOTE   MERA, ZUBEK                         MRN:          [id number]  DATE:06/22/2006                            DOB:          12/31/1945    Mrs. Soucek returns for follow-up today.  She has a history of coronary  disease, hypertension, hyperlipidemia, and small pericardial effusion.  Since I last saw her she has some dyspnea on exertion but there is no  orthopnea, PND, pedal edema, palpitations, or chest pain.  She has  discontinued her Statin as well as her Benicar and HCTZ.   MEDICATIONS:  1. Amlodipine 10 mg p.o. daily.  2. Labetalol 300 mg p.o. daily.  3. Glimepiride 1 mg p.o. daily.  4. Aspirin 81 mg p.o. daily.  5. Zantac 150 mg p.o. daily.   PHYSICAL EXAMINATION:  VITAL SIGNS:  Blood pressure 142/80 and pulse is  63.  She weighs 212 pounds.  NECK:  Supple with no bruits.  CHEST:  Clear.  CARDIOVASCULAR:  Regular rate and rhythm.  EXTREMITIES:  Showed no edema.   DIAGNOSES:  1. Coronary artery disease.  2. History of small pericardial effusion.  3. Hypertension.  4. Hyperlipidemia.  5. History of renal insufficiency.  6. History of sleep apnea.   PLAN:  Mrs. Neve is doing well from a symptomatic standpoint.  She  will need follow-up carotid Dopplers in July of 2009.  She will continue  with risk factor modification.  Note she discontinued her Benicar  secondary to itching.  She has also discontinued her hydrochlorothiazide  and her Crestor.  I stressed the importance of a Statin and she will try  simvastatin 40 mg p.o. q.h.s. due to financial reasons.  We will check  lipids and liver in six weeks.  Also note she decreased her labetalol to  300 mg p.o. daily.  She states otherwise it makes her feel drunk.  Her  blood pressure is reasonable today.  We will therefore see her back in  six months.     Denice Bors [name], MD, [facility]  Electronically Signed   BSC/MedQ  DD:  06/22/2006  DT: 06/22/2006  Job #: [id number]

## 2010-12-04 NOTE — H&P (Signed)
[place]. [facility]  Patient:    [name], [name] Visit Number: [id number] MRN: [id number]          Service Type: MED Location: [place] 01 Attending Physician:  Alvie Heidelberg Dictated by:   Cristopher Estimable. Lattie Haw, M.D. [facility] Admit Date:  09/23/2001                           History and Physical  REFERRING PRIMECARE: Dr. Dellia Nims.  CHIEF COMPLAINT: This is a 65 year old woman, with a history of hypertension, who presents with acute inferior myocardial infarction.  HISTORY OF PRESENT ILLNESS: Onset of symptoms was approximately 1-1/2 to two hours prior to EMS being summoned.  The patient reported a severe gripping precordial pain associated with dyspnea.  This was unaffected by motion or body position.  She had accompanying diaphoresis and nausea but no emesis. She was unable to do anything to relieve the discomfort.  Cardiac history dates to at least 54 when she underwent coronary angiography in [place].  Reportedly, she had single-vessel disease for which medical management was recommended.  She has had long-standing hypertension that has been under good control.  She is unaware of her cholesterol status.  There is no history of diabetes.  She does smoke cigarettes.  PAST MEDICAL HISTORY: No prior surgery.  CURRENT MEDICATIONS:  1. Nifedical 60 mg b.i.d.  2. Aldactone 100 mg b.i.d.  3. Zyrtec 10 mg q.d.  4. Imidazole 20 mg q.d.  SOCIAL HISTORY: The patient is retired, lives with husband.  No history of excessive alcohol.  FAMILY HISTORY: Negative for coronary disease.  REVIEW OF SYSTEMS: All systems negative except as noted.  PHYSICAL EXAMINATION:  GENERAL: Anxiety woman with rapid respirations, in moderately severe pain.  VITAL SIGNS: Heart rate 85 and regular with intermittent short runs of SVT on the monitor.  Blood pressure 155/85, respirations 16.  NECK: No jugular venous distention, no carotid bruits.  HEENT: Sclerae  anicteric.  ENDOCRINE: No thyromegaly.  SKIN: No significant lesions.  HEMATOPOIETIC: No adenopathy.  LUNGS: Clear.  CARDIAC: Normal first and second heart sounds, fourth heart sound present.  ABDOMEN: Soft, nontender.  EXTREMITIES: Distal pulses intact.  No edema.  NEUROMUSCULAR: Symmetric strength and tone.  LABORATORY DATA: EKG - normal sinus rhythm, 1-2 mm ST segment elevation in the inferior leads with reciprocal depression in leads I and L.  IMPRESSION: Acute myocardial infarction with multiple cardiovascular risk factors.  PLAN: Initial treatment will be with beta-blockers, aspirin, intravenous heparin, intravenous nitroglycerin, and morphine.  The cardiac catheterization staff has been notified of the need for urgent coronary angiography and probable intervention. Dictated by:   Cristopher Estimable. Lattie Haw, M.D. [place] Attending Physician:  Alvie Heidelberg DD:  09/23/01 TD:  09/25/01 Job: [id number]

## 2010-12-04 NOTE — Op Note (Signed)
NAMEALLIDA, LAHR NO.:  [id number]   MEDICAL RECORD NO.:  [id number]                   PATIENT TYPE:  OIB   LOCATION:  2550                                 FACILITY:  [facility]   PHYSICIAN:  Melissa Montane, M.D.                    DATE OF BIRTH:  [birth year]   DATE OF PROCEDURE:  12/04/2003  DATE OF DISCHARGE:                                 OPERATIVE REPORT   PREOPERATIVE DIAGNOSES:  1. Obstructive sleep apnea.  2. Turbinate hypertrophy.   POSTOPERATIVE DIAGNOSES:  1. Obstructive sleep apnea.  2. Turbinate hypertrophy.   SURGICAL PROCEDURE:  Submucous resection of inferior turbinates.   ANESTHESIA:  General endotracheal tube.   ESTIMATED BLOOD LOSS:  Less than 10 mL.   INDICATIONS:  This is a 65 year old who has had chronic obstructive sleep  apnea, who is not able to tolerate her CPAP machine.  She has had  significant issues and not wearing the machine because of the nasal  obstruction problem.  She has failed medical therapy.  The septum is midline  and only the turbinates are excessively large.  She was informed of the  risks and benefits of the procedure, including bleeding, infection, chronic  crusting and drying, scarring, and risks of the anesthetic.  All questions  are answered and consent was obtained.   OPERATION:  The patient was taken to the operating room and placed in the  supine position, after adequate general endotracheal tube anesthesia was  placed in the supine position, prepped and draped in the usual sterile  manner.  The oxymetazoline pledgets were placed into the nose bilaterally  and the inferior turbinates were injected with 1% lidocaine with 1:100,000  epinephrine approximately 2 mL.  The turbinates were infractured, a midline  incision made with a 15 blade, the mucosal flap elevated superiorly.  The  inferior mucosa and bone were removed with a turbinate scissors.  The edge  was cauterized with suction cautery and the  turbinates were outfractured,  and the flap was laid back down over the raw surface.  There was good  hemostasis.  The Telfa rolls soaked in bacitracin were placed into the nose  bilaterally and secured with 3-0 nylon.  The patient was awakened, brought  to recovery in stable condition, but the patient did have some  postanesthesia complication, which was handled by anesthesia and will be  dictated or recorded in anesthetic notes.  The patient was brought to  recovery in stable condition, counts correct.                                               Melissa Montane, M.D.   Geralynn Rile  D:  12/04/2003  T:  12/04/2003  Job:  [id number]  cc:   Asencion Noble, M.D. [place] V. Plotnikov, M.D. [facility]

## 2010-12-04 NOTE — Discharge Summary (Signed)
[name]. [facility]  Patient:    [name], [place] Visit Number: [id number] MRN: [id number]          Service Type: MED Location: [id number] Attending Physician:  Alvie Heidelberg Dictated by:   Arn Medal, P.A. Admit Date:  09/23/2001 Discharge Date: 09/25/2001   CC:         Christy Sartorius, M.D. [facility]  [facility] [address]   Discharge Summary  DISCHARGE DIAGNOSES: 1. Acute inferior myocardial infarction. 2. Hypertension. 3. Hyperlipidemia. 4. Gastroesophageal reflux disease.  HOSPITAL COURSE:  [name] is a pleasant 65 year old female who presented to [facility] emergency room on March the 08th with substernal chest pain.  She was seen by Dr. Jacqulyn Ducking who felt that the patient was having an acute inferior wall myocardial infarction.  The patient was then taken emergently to the cath lab by Dr. Christy Sartorius. Catheterization results: 1. Left main coronary artery; mild disease. 2. Left anterior descending; three diagonals, 30-40% midvessel lesion. 3. Circumflex; large bifurcating OM-2, 30% lesion. 4. Right coronary artery; dominant, 99/70% lesions in the midvessel. 5. Left ventriculogram; ejection fraction greater than 65%, no mitral    regurgitation.  Dr. Lyndel Safe then proceeded to perform angioplasty and stenting to the lesions in the right coronary artery.  The result was good with 9% residual stenosis and TIMI-III flow, and no vessel damage.  The next day the patient was doing well, had no further chest pain or shortness of breath.  Later that morning it was noted on telemetry that the patients heart rate dropped into the mid 30s briefly and then returned into the 50s.  Patient was asymptomatic.  At midnight on March the 10th the patients blood pressure was noted to 190/110.  The patient had received 50 of Lopressor at 23:30.  Blood pressure was rechecked at 0400 hours and it was down to 170/90.  Later that morning  the patient was seen by Dr. Lattie Haw.  He felt she was doing well and ready for discharge.  DISCHARGE MEDICATIONS: 1. Chlorthalidone 12.5 mg q.d. 2. Altace 10 mg q.d. 3. Coated aspirin 325 mg q.d. 4. Zocor 20 mg q.h.s. 5. Plavix 75 mg q.d. for one month. 6. Toprol XL 50 mg q.d. 7. Sublingual nitroglycerin as needed. 8. Zyrtec as needed. 9. Prilosec as previously taken.  LABORATORY VALUES:  CK 935, MB fraction 145.8 with an index of 16.6.  Troponin I 34.61.  Sodium 142, potassium 4.2, chloride 107, CO2 26, BUN 22, creatinine 1.3, and glucose 119.  Total cholesterol 181, triglycerides 189, HDL 50, LDL 93, and total cholesterol to HDL ratio 3.6.  White count 7.0, hemoglobin 12.2, hematocrit 36.8 and platelets 320,000.  Chest x-ray showed cardiomegaly.  There was question of mediastinal adenopathy and follow up PA and lateral chest x-rays were recommended.  Electrocardiogram showed sinus bradycardia with occasional PACs.  Heart rate of 56, PR interval 140, QRS 94, QTC 414, and axis 43.  DISCHARGE INSTRUCTIONS:  Patient is to avoid driving, heavy lifting and tub baths times 24 hours.  She is to follow a low-fat and low-cholesterol diet. She is to watch the cath site for any pain, bleeding or swelling; and, told to call our office for any of these problems.  FOLLOW-UP:  She is to follow up with a P.A. visit in the office on March 31st at 9 in the morning.  At that time she will be assigned to a primary cardiologist.  She will also need assistance  to try to find a primary care physician at that time. Dictated by:   Arn Medal, P.A. Attending Physician:  Alvie Heidelberg DD:  09/25/01 TD:  09/26/01 Job: [id number]

## 2010-12-04 NOTE — Cardiovascular Report (Signed)
[name], [name] NO.:  [id number]   MEDICAL RECORD NO.:  [id number]                   PATIENT TYPE:  OIB   LOCATION:  2102                                 FACILITY:  [facility]   PHYSICIAN:  Eustace Quail, M.D. [facility]              DATE OF BIRTH:  Jul 12, 1946   DATE OF PROCEDURE:  12/05/2003  DATE OF DISCHARGE:                              CARDIAC CATHETERIZATION   PROCEDURE:  Cardiac catheterization.   CLINICAL HISTORY:  [name] is 65 years old and has had a previous VMI  treated with angioplasty by Dr. Lyndel Safe.  She was having elective ENT surgery  and following surgery she developed bradycardia and marked asystole  requiring external cardiac compression.  She was admitted to the hospital  and stabilized and seen in consultation with Dr. Lyndel Safe who felt she should  be catheterized to rule out ischemic etiology.  When she came to the  catheterization lab she was in atrial fibrillation with a rate of about 90  and she converted, while in the catheterization lab, back to sinus rhythm.   CARDIOLOGIST:  Eustace Quail, M.D.   DESCRIPTION OF PROCEDURE:  The procedure was performed via the right femoral  artery utilizing an arterial sheath and 6-French preformed coronary  catheters. A front wall puncture was performed, and Omnipaque contrast was  used. The right femoral artery was closed with AngioSeal at the end of the  procedure.  The patient tolerated the procedure well, and left the  laboratory in satisfactory condition.   RESULTS:  1. The aortic pressure was 187/101 with a mean of 135.  The left midpressure     was 187/29.  2. The left main coronary artery was free of significant disease.  3. The left anterior descending artery gave rise to 3 large diagonal     branches and 2 large septal perforators. These and the LAD proper were     free of significant disease.  4. The circumflex artery gave rise to a ramus branch and 3 posterolateral     branches.  These vessels were free of significant disease.  5. The right coronary artery is a moderate-size vessel that gave rise a     __________ branch, 3 right ventricular branches, a posterior descending     and 2 posterolateral branches.  There was 40% narrowing in the midright     coronary artery.   LEFT VENTRICULOGRAM:  The left ventriculogram performed in the RAO  projection showed hypokinesis of the inferobasal segment.  The overall wall  motion was good with an estimated ejection fraction was 60%.   CONCLUSIONS:  Coronary artery disease status post remote documented  myocardial infarction treated with PTCA of the right coronary artery with  nonobstructive coronary disease with 40% narrowing in the midright coronary  artery and no significant obstruction in the circumflex and left anterior  descending arteries and inferobasal wall hypokinesis.  RECOMMENDATIONS:  The patient's asystole following surgery is probably  vagally mediated possibly related to anesthesia of the surgery itself.  She  may have sick sinus syndrome since she did have paroxysmal atrial  fibrillation  in the hospital, although her rate was controlled.  She is at low risk for  systemic emboli from her atrial fibrillation since her LV function is good  and she is 65 years old. I think that we can continue her on her same  medications and we will adjust her medications for her blood pressure and  let her go home later today.                                               Eustace Quail, M.D. [facility]    BB/MEDQ  D:  12/05/2003  T:  12/06/2003  Job:  [id number]   cc:   Melissa Montane, M.D.  [place]  [place] [address]  Fax: [phone]   Christy Sartorius, M.D.   Eustace Quail, M.D. [place] V. Plotnikov, M.D. [facility]   Cardiac Cath Lab

## 2010-12-04 NOTE — Assessment & Plan Note (Signed)
[facility]                             PRIMARY CARE OFFICE NOTE   [name], [name]                         MRN:          [id number]  DATE:04/19/2006                            DOB:          02-Dec-1945    PROCEDURE:  Steroid injections, left shoulder.   INDICATIONS:  Subacromial bursitis.   INDICATIONS:  Risks including bleeding, infection, incomplete procedure,  skin atrophy and others, as well as benefits, were explained to the patient  in detail.  She agreed to proceed.   DESCRIPTION OF PROCEDURE:  The area was prepped and draped in the alcohol  and injected in the usual fashion with 50 mg of Depo-Medrol and 2 mL of  lidocaine with epinephrine.  She tolerated it well without complications.  Good pain relief immediately following the procedure.   PROCEDURE #2:  Trigger point injection.   INDICATIONS:  Painful muscles above left shoulder blade.  The risks and  benefits were explained to the patient as above.  She agreed to proceed.  Two trigger points were injected with 1 mL of lidocaine and 10 mg of Depo-  Medrol each.  She tolerated it well without complications.            ______________________________  [name] Plotnikov, MD      AVP/MedQ  DD:  04/20/2006  DT:  04/21/2006  Job #:  [id number]

## 2010-12-04 NOTE — Cardiovascular Report (Signed)
[name]. [facility]  Patient:    [name], [name] Visit Number: [id number] MRN: [id number]          Service Type: MED Location: [place] 01 Attending Physician:  Alvie Heidelberg Dictated by:   Christy Sartorius, M.D. [facility] Proc. Date: 09/23/01 Admit Date:  09/23/2001   CC:         [name], M.D. [facility]  Dr. Gaspar Bidding, [facility]   Cardiac Catheterization  PROCEDURES PERFORMED: 1. Left heart catheterization. 2. Left ventriculogram. 3. Selective coronary angiography. 4. Percutaneous transluminal coronary angioplasty and stent placement, mid    right coronary artery.  DIAGNOSES: 1. Severe single-vessel coronary artery disease. 2. Normal left ventricular systolic function. 3. Acute inferior wall myocardial infarction.  HISTORY:  [name] is a 65 year old black female who presents to [facility] with acute onset substernal chest discomfort.  The patient awoke at approximately 10 a.m. and had substernal chest discomfort with shortness of breath and nausea.  She felt worse in the morning and finally presented to [facility] Emergency Room at approximately 12 noon where she was found to have ST elevation in the inferior leads significant with an acute inferior wall myocardial infarction.  She was brought to the catheterization lab emergently for left heart catheterization.  DESCRIPTION OF PROCEDURE:  Informed consent was obtained.  The patient was brought to the catheterization lab.  A #7 [place] arterial and #6 French venous sheath were placed in the right groin using the modified Seldinger technique. A #5 French pigtail catheter was then advanced into the left ventricle. Appropriate hemodynamics were obtained.  The left ventriculogram was then performed using power injections of contrast.  A #6 [place] JL-4 catheter was then used to engage the left coronary artery and selective angiography performed using  manual injections of contrast.  Finally, a #7 [place] JR-4 guide catheter was introduced and used to engage the right coronary artery. Selective angiography was performed using manual injections of contrast. Initial findings are as follows:  FINDINGS: 1. Left main trunk:  Mild irregularities. 2. LAD:  This is a large caliber vessel that provides 3 diagonal branches.    There is moderate disease of 30-40% in the mid section of the LAD. 3. Left circumflex artery:  This is a large caliber vessel that provides a    small first marginal branch in the proximal segment, and a large    bifurcating second marginal branch distally.  There is mild disease of    30% at the bifurcation of the second marginal branch. 4. Right coronary artery:  Dominant.  This is a small caliber vessel that    provides several small branches of the distal segment that ______ the    proximal mid inferior septum.  The right coronary artery has subtotal    narrowing of 99% in the mid section followed by narrowing of 70%.  This    mid right coronary artery was severely and diffusely diseased.  There    were several RV marginal branches that arise from this segment.  The    distal vessel had mild irregularities.  LV:  Normal end systolic and end diastolic dimensions.  Overall left ventricular function appeared well preserved.  Ejection fraction of greater than 65%.  No mitral regurgitation.  No wall motion abnormalities were noted. LV pressure was 144/10.  Aortic was 144/85.  LVEDP equals 20.  With these findings, we elected to proceed with percutaneous intervention to the right coronary artery.  The patient previously had been consented for participation in the [facility] study and was randomized to placebo.  She received aspirin and heparin in the emergency room.  ACT was maintained at greater than 214 seconds.  She was given 300 mg of Plavix orally and ReoPro bolus and infusion on a weight-adjusted basis.  A 0.014-inch  Extra Sport wire was advanced in the distal right coronary artery and a 2.5- x 15-mm CrossSail balloon introduced.  One inflation was performed at the site of subtotal occlusion at 6 atmospheres for 30 seconds.  A total of 5 additional inflations were performed within the mid section of the right coronary artery at up to 8 atmospheres for 30 seconds.  Repeat angiography showed significant improvement in vessel lumen with approximately 50% residual narrowing.  There was improvement of TIMI grade 1 to TIMI grade 3 flow.  There was no evidence of vessel damage or distal thromboembolic phenomena; however, moderate vessel recoil was noted.  A 2.75- x 24-mm Express stent was introduced and carefully positioned between the RV marginal branches and deployed at 12 atmospheres for 30 seconds.  Mild residual waist was noted of the proximal section of the stent of 20-30% and a 2.75- x 12-mm Quantum Maverick balloon was introduced. A total of 3 inflations were performed within the proximal and mid section of the stent at up to 16 atmospheres for 30 seconds.  Repeat angiography was then performed after the administration of intracoronary nitroglycerin showing an excellent result with no residual stenosis, TIMI-3 flow in the distal vessel, and no distal vessel damage.  The guide catheter was then removed.  The sheath was secured in position.  The patient tolerated the procedure well and was transferred to the floor in stable condition.  FINAL RESULT:  Successful percutaneous transluminal coronary angioplasty and stent placement of the mid right coronary artery with reduction of 99% subtotal narrowing to 0% with placement of a 2.75- x 24-mm Express 2 stent and improvement in TIMI grade 1 to TIMI grade 3 flow. ASSESSMENT AND PLAN: Dictated by:   Christy Sartorius, M.D. [place] Attending Physician:  Alvie Heidelberg DD:  09/23/01 TD:  09/23/01  Job: [id number]

## 2010-12-04 NOTE — Consult Note (Signed)
NAMEKELAH, SPROUSE NO.:  [id number]   MEDICAL RECORD NO.:  [id number]                   PATIENT TYPE:  OIB   LOCATION:  2899                                 FACILITY:  [place]   PHYSICIAN:  Christy Sartorius, M.D.                   DATE OF BIRTH:  Dec 14, 1945   DATE OF CONSULTATION:  12/04/2003  DATE OF DISCHARGE:                                   CONSULTATION   CONSULTING PHYSICIAN:  Christy Sartorius, M.D.   CHIEF COMPLAINT:  Asystole and chest pain.   HISTORY:  Ms. Feasel is a 65 year old black female with a known history of  coronary artery disease as well as sleep apnea who developed asystole while  recovering from anesthesia, following turbinate reduction and submucosal  resection for treatment of sleep apnea.  While coming off anesthesia, the  patient developed approximately 10 seconds of asystole requiring chest  compressions, atropine, and epinephrine with restoration of normal sinus  rhythm.  Currently, she is hemodynamically stable in normal sinus.  She  denies any chest pain or shortness of breath.  She has noted some  intermittent episodes of chest pain for the last one month and had an  episode yesterday while grocery shopping.  She has had some dyspnea on  exertion as well and recently traveled to [place] and felt that she had  developed some lower extremity as well as facial edema at that time and that  has subsequently resolved.  She has not had any orthopnea or paroxysmal  nocturnal dyspnea, no syncope but has had some recent episodes of  lightheadedness.   REVIEW OF SYMPTOMS:  Otherwise notable for arthralgias of the left hip and  feet due to gout, reflux symptoms, all other symptoms are negative.   PAST MEDICAL HISTORY:  1. Coronary artery disease status post inferior wall myocardial infarction     in March 2003, treated with percutaneous stent placement to the mid right     coronary artery.  She had noncritical disease in the left  coronary system     and overall well preserved LV function.  2. Hypertension.  3. Dyslipidemia.  4. Obstructive sleep apnea.  5. History of medical noncompliance.   SOCIAL HISTORY:  The patient lives in [place] with her husband.  She is  retired.  She has a history of tobacco use, currently at 3 cigarettes per  day.  Uses alcohol socially.   FAMILY HISTORY:  Mother alive with hypertension.  Father died of a stroke.  Four brothers with coronary disease.  One sister died at the age of 11 of  anorexia.   ALLERGIES:  1. BACTRIM.  2. SULFA __________ .   CURRENT MEDICATIONS:  1. Nexium 40 mg per day.  2. Labetalol 200 mg b.i.d.  3. Norvasc 10 mg every day.  4. Aspirin 81 mg a day.  5. Nitroglycerin as needed.  The patient recently stopped taking spironolactone as it made her feel weak,  Aceon which caused her to itch, and Zocor which she has taken only  intermittently.   PHYSICAL EXAMINATION:  GENERAL:  She is an overweight black female in no  acute distress.  VITAL SIGNS:  Temperature of 97.1, pulse 67, respirations 20, blood pressure  116/57, O2 saturation is 98% on room air.  HEENT:  Pupils equal, round and reactive to light.  Extraocular muscles are  intact.  Oropharynx shows no lesions.  She has nasal packing due to recent  surgery.  No blood was noted on dressing.  NECK:  Supple without jugular venous distention or adenopathy.  No bruits.  HEART:  Reveals a regular rate without murmurs.  LUNGS:  Diminished breath sounds with poor effort, otherwise clear.  ABDOMEN:  Nontender.  EXTREMITIES:  No significant edema.  Peripheral pulses are 2+ and equal  bilaterally.  Motion is 5/5.  Sensory is intact to touch.   ECG shows a normal sinus rhythm at 74 with poor R wave progression, T wave  inversion in the lateral leads.  No ST segment shifts.   Chest x-ray shows mild cardiomegaly with mild vascular congestion.   LABORATORY DATA:  White count is 8.1, hemoglobin 10.9,  hematocrit 32.6,  platelets 275.  Sodium is 140, potassium 3.5, chloride 106, bicarb 28, BUN  27, creatinine 1.6, glucose 179.  PT is 13.2, INR is 1.0, PTT is 37.   ASSESSMENT/PLAN:  Ms. Mcmains is a 65 year old female with a known history of  coronary artery disease who has recently developed chest discomfort, dyspnea  on exertion, and asystole following treatment with anesthesia.  Given her  history of coronary disease, she is at high risk for progression of disease  particularly in light of her continued tobacco use.  We will plan on  observing the patient on telemetry for any significant dysrhythmias.  We  will rule out acute myocardial infarction with serial enzymes and pay close  attention to her hemodynamics.  At this point, I will hold off on  anticoagulation given her recent surgery and no current symptoms, and we  will plan on proceeding with further assessment of her coronary arteries  with catheterization tomorrow.  I believe this would be prudent to define  her anatomy and assess her risk for recurrent event and to guide further  treatment.  The risks, benefits, and alternatives of cardiac catheterization  possible intervention were discussed with the patient who understands and  wishes to proceed.                                               Christy Sartorius, M.D.    NG/MEDQ  D:  12/04/2003  T:  12/05/2003  Job:  [id number]   cc:   [name]. Plotnikov, M.D. [facility]   Melissa Montane, M.D.  [phone] [address]  [place] [address]  Fax: [phone]

## 2010-12-04 NOTE — Op Note (Signed)
[name], [name] NO.:  [id number]   MEDICAL RECORD NO.:  [id number]          PATIENT TYPE:  OIB   LOCATION:  2887                         FACILITY:  [place]   PHYSICIAN:  Melissa Montane, M.D.       DATE OF BIRTH:  22-Jun-1946   DATE OF PROCEDURE:  08/20/2004  DATE OF DISCHARGE:                                 OPERATIVE REPORT   PREOPERATIVE DIAGNOSIS:  Left tonsil mass.   POSTOPERATIVE DIAGNOSIS:  Left tonsil mass.   SURGICAL PROCEDURE:  Excision of left tonsil mass.   ANESTHESIA:  General endotracheal tube.   ESTIMATED BLOOD LOSS:  Less than 5 cc.   INDICATIONS:  This is a 65 year old who has had a mass on her left tonsil  that has enlarged.  She is very worried about it.  She has an extensive gag  reflex, so it could not be biopsied or removed in the office.  She was  informed of the risks and benefits of the procedure including bleeding,  infection, scarring, recurrence, and risk of the anesthetic.  All questions  were answered, and consent was obtained.   OPERATION:  The patient was taken to the operating room and placed in the  supine position.  After adequate general endotracheal tube anesthesia, she  was placed in the Rose position, draped in the usual sterile manner.  The  Crowe-Davis mouth gag was inserted, retracted, and suspended from the [place]  stand.  The mass on the left tonsil was grasped and removed with the  electrocautery.  It had the appearance of a papilloma.  It was sent for  permanent section.  There was good hemostasis with the cautery.  There were  no other lesions noted.  The patient was then awakened and brought to the  recovery room in stable condition.  Counts correct.      JB/MEDQ  D:  08/20/2004  T:  08/20/2004  Job:  [id number]

## 2010-12-08 ENCOUNTER — Other Ambulatory Visit: Payer: Self-pay | Admitting: *Deleted

## 2010-12-08 ENCOUNTER — Ambulatory Visit (INDEPENDENT_AMBULATORY_CARE_PROVIDER_SITE_OTHER): Payer: Medicare Other | Admitting: Adult Health

## 2010-12-08 ENCOUNTER — Encounter: Payer: Self-pay | Admitting: Adult Health

## 2010-12-08 VITALS — BP 132/70 | HR 61 | Temp 98.2°F | Ht 62.0 in | Wt 170.2 lb

## 2010-12-08 DIAGNOSIS — R05 Cough: Secondary | ICD-10-CM

## 2010-12-08 MED ORDER — NEBIVOLOL HCL 10 MG PO TABS: 10.0000 mg | ORAL_TABLET | Freq: Every day | ORAL | Status: DC

## 2010-12-08 NOTE — Progress Notes (Signed)
Subjective:    [name], female    DOB: Jan 13, 1946, 65 y.o.   MRN: [id number]  HPI  35 yobf quit smoking in 2003 at [place] with no resp complaints at that point   September 22, 2010 1st pulmonary office eval cc indolent onset progressively worse cough x 6 months daily worst first thing in am takes 45 min to clear clear white occ yellow occ slt bloody and when head hits the pillow and all night long. neg ent w/u per pt. assoc with mild dysphagia.  Imp was uacs  rec 1) stop labetolol, amlodipine  2) Bystolic 10 mg one twice daily (take once daily if too sluggish or blood pressure too low  3) Nexium Take one 30-60 min before first meal of the day and Pepcid at bedtime  4) Reglan (metaclopramide) take one before each meal and at bedtime  5) Prednisone 4 each am x 2days, 2x2days, 1x2days and stop  6) GERD diet   10/15/2010 ov 75% better at this point main c/o dry mouth attributes to clonidine, using mint gum to offset. No excess mucus, noct cough or sob.  rec Avoid anything with mint  Reduce Clonidine to one half tablet three times a day - this will help your dry mouth  Increase the bystolic to 20 mg per day and if your blood pressure is too low then stop the clonidine completely  Please schedule a follow up office visit in 6 weeks, call sooner if needed   11/24/2010 ov/Wert cc  Acute onset minimally productive cough bad again x 2-3 weeks esp p eating and when lie down assoc with severe dysphagia,  Also wakes up at night with choking sensation/ sob whether HD that day or not, assoc with dysphagia despite max gerd rx and reglan rx.>>reglan increased Four times a day    12/08/10 Follow up and med review.  Pt returns for follow up and med review. We reveiwed all her meds and organized them into a med calendar w/ pt education.  She did not go for her xray last ov -will go today. Also only taking reglan Twice daily   Got prevacid instead of pepcid  She is feeling better w/ decreased cough  but still has a lot of drainage in throat esp at night.     .      Review of Systems Constitutional:   No  weight loss, night sweats,  Fevers, chills, fatigue, or  lassitude.  HEENT:   No headaches,  Difficulty swallowing,  Tooth/dental problems, or  Sore throat,                No sneezing, itching, ear ache, nasal congestion,  +post nasal drip,   CV:  No chest pain,  Orthopnea, PND, swelling in lower extremities, anasarca, dizziness, palpitations, syncope.   GI  No heartburn, indigestion, abdominal pain, nausea, vomiting, diarrhea, change in bowel habits, loss of appetite, bloody stools.   Resp: No shortness of breath with exertion or at rest.     No coughing up of blood.  No change in color of mucus.  No wheezing.  No chest wall deformity  Skin: no rash or lesions.  GU: no dysuria, change in color of urine, no urgency or frequency.  No flank pain, no hematuria   MS:  No joint pain or swelling.  No decreased range of motion.  No back pain.  Psych:  No change in mood or affect. No depression or anxiety.  No  memory loss.         Objective:   Physical Exam GEN: A/Ox3; pleasant , NAD, well nourished   HEENT:  [place]/AT,  EACs-clear, TMs-wnl, NOSE-clear drainage  THROAT-clear, no lesions, no postnasal drip or exudate noted.   NECK:  Supple w/ fair ROM; no JVD; normal carotid impulses w/o bruits; no thyromegaly or nodules palpated; no lymphadenopathy.  RESP  Clear  P & A; w/o, wheezes/ rales/ or rhonchi.no accessory muscle use, no dullness to percussion  CARD:  RRR, no m/r/g  , no peripheral edema, pulses intact, no cyanosis or clubbing.  GI:   Soft & nt; nml bowel sounds; no organomegaly or masses detected.  Musco: Warm bil, no deformities or joint swelling noted.   Neuro: alert, no focal deficits noted.    Skin: Warm, no lesions or rashes         Assessment & Plan:

## 2010-12-08 NOTE — Patient Instructions (Signed)
Add Chlor tabs 4mg   2  At bedtime  As needed  Drainage.  Change Prevacid to Pepcid 20mg  At bedtime   Increase Metoclopramide 10mg  Four times a day   Follow Med calendar closely and bring to each visit.  follow up Dr. Melvyn Novas  In 4 weeks.

## 2010-12-08 NOTE — Assessment & Plan Note (Addendum)
Improving on present regimen w/ tx aimed at GERD and rhinitis prevention Patient's medications were reviewed today and patient education was given. Computerized medication calendar was adjusted/completed Add chlor tabs for PND symptoms Increase reglan for max gerd control- for short term use.  Xray pending today  follow up 4 weeks Dr. Melvyn Novas  And As needed

## 2010-12-15 ENCOUNTER — Ambulatory Visit (INDEPENDENT_AMBULATORY_CARE_PROVIDER_SITE_OTHER)
Admission: RE | Admit: 2010-12-15 | Discharge: 2010-12-15 | Disposition: A | Discharge status: Home / Self Care | Payer: Medicare Other | Source: Ambulatory Visit | Attending: Internal Medicine | Admitting: Internal Medicine

## 2010-12-15 DIAGNOSIS — R05 Cough: Secondary | ICD-10-CM

## 2010-12-16 NOTE — Progress Notes (Signed)
Quick Note:  Spoke with pt and notified of results per Dr. Wert. Pt verbalized understanding and denied any questions.  ______ 

## 2011-01-07 ENCOUNTER — Ambulatory Visit: Payer: Medicare Other | Admitting: Internal Medicine

## 2011-02-09 ENCOUNTER — Encounter: Payer: Self-pay | Admitting: Internal Medicine

## 2011-02-09 ENCOUNTER — Ambulatory Visit (INDEPENDENT_AMBULATORY_CARE_PROVIDER_SITE_OTHER): Payer: Medicare Other | Admitting: Internal Medicine

## 2011-02-09 ENCOUNTER — Ambulatory Visit: Payer: Medicare Other | Admitting: Internal Medicine

## 2011-02-09 VITALS — BP 140/88 | HR 69 | Temp 98.3°F | Ht 62.0 in | Wt 171.6 lb

## 2011-02-09 DIAGNOSIS — R05 Cough: Secondary | ICD-10-CM

## 2011-02-09 NOTE — Patient Instructions (Addendum)
Stop metachlopromide and the chlortabs - if cough worsens it would indicate this is a stomach problem  If the cough does not worsen I would recommend you let [name] take another look at your throat and write me a note about what he thinks about the cause of your gagging.   See calendar for specific medication instructions and bring it back for each and every office visit for every healthcare provider you see.  Without it,  you may not receive the best quality medical care that we feel you deserve.  You will note that the calendar groups together  your maintenance  medications that are timed at particular times of the day.  Think of this as your checklist for what your doctor has instructed you to do until your next evaluation to see what benefit  there is  to staying on a consistent group of medications intended to keep you well.  The other group at the bottom is entirely up to you to use as you see fit  for specific symptoms that may arise between visits that require you to treat them on an as needed basis.  Think of this as your action plan or "what if" list.   Separating the top medications from the bottom group is fundamental to providing you adequate care going forward.

## 2011-02-09 NOTE — Progress Notes (Signed)
Subjective:    [name], female    DOB: Oct 11, 1945, 65 y.o.   MRN: [id number]  HPI  89  yobf quit smoking in 2003 at [place] with no resp complaints at that point   September 22, 2010 1st pulmonary office eval cc indolent onset progressively worse cough x 6 months daily worst first thing in am takes 45 min to clear clear white occ yellow occ slt bloody and when head hits the pillow and all night long. neg ent w/u per pt. assoc with mild dysphagia.  Imp was uacs  rec 1) stop labetolol, amlodipine  2) Bystolic 10 mg one twice daily (take once daily if too sluggish or blood pressure too low  3) Nexium Take one 30-60 min before first meal of the day and Pepcid at bedtime  4) Reglan (metaclopramide) take one before each meal and at bedtime  5) Prednisone 4 each am x 2days, 2x2days, 1x2days and stop  6) GERD diet   10/15/2010 ov 75% better at this point main c/o dry mouth attributes to clonidine, using mint gum to offset. No excess mucus, noct cough or sob.  rec Avoid anything with mint  Reduce Clonidine to one half tablet three times a day - this will help your dry mouth  Increase the bystolic to 20 mg per day and if your blood pressure is too low then stop the clonidine completely  Please schedule a follow up office visit in 6 weeks, call sooner if needed   11/24/2010 ov/[name] cc  Acute onset minimally productive cough bad again x 2-3 weeks esp p eating and when lie down assoc with severe dysphagia,  Also wakes up at night with choking sensation/ sob whether HD that day or not, assoc with dysphagia despite max gerd rx and reglan rx.>> reglan increased Four times a day    12/08/10 Follow up and med review.  Pt returns for follow up and med review. We reveiwed all her meds and organized them into a med calendar w/ pt education.  She did not go for her xray last ov -will go today. Also only taking reglan Twice daily   Got prevacid instead of pepcid  She is feeling better w/ decreased cough  but still has a lot of drainage in throat esp at night.   Add Chlor tabs 4mg   2  At bedtime  As needed  Drainage.  Change Prevacid to Pepcid 20mg  At bedtime   Increase Metoclopramide 10mg  Four times a day   Follow Med calendar closely and bring to each visit  02/09/2011  Ov/[name]  Cc some better but not convinced, still lots of symptoms of throat fullness at hs. No sob.  Pt denies any significant sore throat, dysphagia, itching, sneezing,  nasal congestion or excess/ purulent secretions,  fever, chills, sweats, unintended wt loss, pleuritic or exertional cp, hempoptysis, orthopnea pnd or leg swelling.    Also denies any obvious fluctuation of symptoms with weather or environmental changes or other aggravating or alleviating factors.      .               Objective:   Physical Exam GEN: A/Ox3; pleasant , NAD, well nourished   HEENT:  [place]/AT,  EACs-clear, TMs-wnl, NOSE-clear drainage  THROAT-clear, no lesions, no postnasal drip or exudate noted.   NECK:  Supple w/ fair ROM; no JVD; normal carotid impulses w/o bruits; no thyromegaly or nodules palpated; no lymphadenopathy.  RESP  Clear  P & A;  w/o, wheezes/ rales/ or rhonchi.no accessory muscle use, no dullness to percussion  CARD:  RRR, no m/r/g  , no peripheral edema, pulses intact, no cyanosis or clubbing.  GI:   Soft & nt; nml bowel sounds; no organomegaly or masses detected.  Musco: Warm bil, no deformities or joint swelling noted.   Neuro: alert, no focal deficits noted.    Skin: Warm, no lesions or rashes         Assessment & Plan:

## 2011-02-09 NOTE — Assessment & Plan Note (Signed)
I had an extended summary  discussion with the patient today lasting 15 to 20 minutes of a 25 minute visit on the following issues:   She now appears completely compliant with a complex regimen that fully addresses gerd and non-specific pnds and is still not satisfied she's better so next step is to do the opposite of a therapeutic trial off gerd rx and see if flares, if so needs gi re-eval.  NB the  ramp to expected improvement in symptoms from an empiric trial of PPI (and for that matter, worsening, if a chronic effective medication is stopped)  can be measured in weeks, not days, a common misconception because this is not the same as treating heartburn (no immediate cause and effect relationship)  so that response to therapy or lack thereof can be very difficult to assess.  See instructions for specific recommendations which were reviewed directly with the patient who was given a copy with highlighter outlining the key components.

## 2011-02-23 ENCOUNTER — Ambulatory Visit (INDEPENDENT_AMBULATORY_CARE_PROVIDER_SITE_OTHER): Payer: Medicare Other | Admitting: Internal Medicine

## 2011-02-23 ENCOUNTER — Encounter: Payer: Self-pay | Admitting: Internal Medicine

## 2011-02-23 DIAGNOSIS — M545 Low back pain, unspecified: Secondary | ICD-10-CM

## 2011-02-23 DIAGNOSIS — I10 Essential (primary) hypertension: Secondary | ICD-10-CM

## 2011-02-23 DIAGNOSIS — L299 Pruritus, unspecified: Secondary | ICD-10-CM

## 2011-02-23 DIAGNOSIS — I509 Heart failure, unspecified: Secondary | ICD-10-CM

## 2011-02-23 DIAGNOSIS — M25569 Pain in unspecified knee: Secondary | ICD-10-CM

## 2011-02-23 DIAGNOSIS — M25561 Pain in right knee: Secondary | ICD-10-CM

## 2011-02-23 DIAGNOSIS — N19 Unspecified kidney failure: Secondary | ICD-10-CM

## 2011-02-23 DIAGNOSIS — K117 Disturbances of salivary secretion: Secondary | ICD-10-CM

## 2011-02-23 MED ORDER — TRAMADOL HCL 50 MG PO TABS: 50.0000 mg | ORAL_TABLET | Freq: Two times a day (BID) | ORAL | Status: DC | PRN

## 2011-02-23 MED ORDER — ICY HOT 10-30 % EX STCK: 1.0000 "application " | Freq: Four times a day (QID) | CUTANEOUS | Status: DC | PRN

## 2011-02-23 NOTE — Assessment & Plan Note (Signed)
As above.

## 2011-02-23 NOTE — Assessment & Plan Note (Signed)
Overall better 

## 2011-02-23 NOTE — Assessment & Plan Note (Signed)
ongoing

## 2011-02-23 NOTE — Assessment & Plan Note (Signed)
Cont w/HD

## 2011-02-23 NOTE — Assessment & Plan Note (Signed)
Biofreeze prn Try Tramadol

## 2011-02-23 NOTE — Assessment & Plan Note (Signed)
Compensated on Rx

## 2011-02-23 NOTE — Progress Notes (Signed)
  Subjective:    [name], female    DOB: 1946-02-18, 65 y.o.   MRN: [id number]  HPI  The patient presents for a follow-up of  chronic hypertension, chronic dyslipidemia, type 2 diabetes controlled with medicines and HD F/u spitting - better some C/o R knee pain and LBP with HD    Review of Systems  Constitutional: Positive for fatigue. Negative for chills, activity change, appetite change and unexpected weight change.  HENT: Negative for congestion, mouth sores and sinus pressure.   Eyes: Negative for visual disturbance.  Respiratory: Positive for cough. Negative for chest tightness.   Gastrointestinal: Negative for nausea and abdominal pain.  Genitourinary: Negative for frequency, difficulty urinating and vaginal pain.  Musculoskeletal: Positive for back pain, arthralgias and gait problem.  Skin: Negative for pallor and rash.  Neurological: Negative for dizziness, tremors, weakness, numbness and headaches.  Psychiatric/Behavioral: Negative for suicidal ideas, confusion and sleep disturbance. The patient is nervous/anxious.        Objective:   Physical Exam  Constitutional: She appears well-developed. No distress.       Obese In NAD  HENT:  Head: Normocephalic.  Right Ear: External ear normal.  Left Ear: External ear normal.  Nose: Nose normal.  Mouth/Throat: Oropharynx is clear and moist.  Eyes: Conjunctivae are normal. Pupils are equal, round, and reactive to light. Right eye exhibits no discharge. Left eye exhibits no discharge.  Neck: Normal range of motion. Neck supple. No JVD present. No tracheal deviation present. No thyromegaly present.  Cardiovascular: Normal rate and regular rhythm.   Murmur (2-3/6 m) heard. Pulmonary/Chest: No stridor. No respiratory distress. She has no wheezes.  Abdominal: Soft. Bowel sounds are normal. She exhibits no distension and no mass. There is no tenderness. There is no rebound and no guarding.  Musculoskeletal: She  exhibits tenderness (LS is tender and R knee is tender ). She exhibits no edema.       Using a cane  Lymphadenopathy:    She has no cervical adenopathy.  Neurological: She displays normal reflexes. No cranial nerve deficit. She exhibits normal muscle tone. Coordination normal.  Skin: No rash noted. No erythema.  Psychiatric: She has a normal mood and affect. Her behavior is normal. Judgment and thought content normal.          Assessment & Plan:    A complex case

## 2011-03-08 ENCOUNTER — Other Ambulatory Visit: Payer: Self-pay | Admitting: Nephrology

## 2011-03-08 DIAGNOSIS — M545 Low back pain: Secondary | ICD-10-CM

## 2011-03-11 ENCOUNTER — Ambulatory Visit
Admission: RE | Admit: 2011-03-11 | Discharge: 2011-03-11 | Disposition: A | Discharge status: Home / Self Care | Payer: Medicare Other | Source: Ambulatory Visit | Attending: Nephrology | Admitting: Nephrology

## 2011-03-11 DIAGNOSIS — M545 Low back pain: Secondary | ICD-10-CM

## 2011-03-16 DIAGNOSIS — Z940 Kidney transplant status: Secondary | ICD-10-CM

## 2011-03-16 HISTORY — PX: KIDNEY TRANSPLANT: SHX239

## 2011-04-08 LAB — CBC
HCT: 31.6 — ABNORMAL LOW
Hemoglobin: 10.4 — ABNORMAL LOW
MCHC: 32.8
MCV: 80.2
Platelets: 267
RBC: 3.95
RDW: 18.6 — ABNORMAL HIGH
WBC: 8.3

## 2011-04-08 LAB — RENAL FUNCTION PANEL
CO2: 27
Chloride: 104
Creatinine, Ser: 3.97 — ABNORMAL HIGH
GFR calc Af Amer: 14 — ABNORMAL LOW
GFR calc non Af Amer: 11 — ABNORMAL LOW
Glucose, Bld: 194 — ABNORMAL HIGH
Sodium: 141

## 2011-04-08 LAB — IRON AND TIBC
Iron: 86
Saturation Ratios: 34
TIBC: 255
UIBC: 169

## 2011-04-09 LAB — FERRITIN: Ferritin: 453 — ABNORMAL HIGH (ref 10–291)

## 2011-04-09 LAB — CBC
HCT: 32.4 — ABNORMAL LOW
Hemoglobin: 10.7 — ABNORMAL LOW
MCV: 81.3
RBC: 3.68 — ABNORMAL LOW
WBC: 8.4
WBC: 7

## 2011-04-09 LAB — COMPREHENSIVE METABOLIC PANEL
Alkaline Phosphatase: 52
BUN: 54 — ABNORMAL HIGH
Chloride: 106
GFR calc non Af Amer: 13 — ABNORMAL LOW
Glucose, Bld: 80
Potassium: 4
Total Bilirubin: 0.6

## 2011-04-09 LAB — DIFFERENTIAL
Basophils Absolute: 0
Basophils Relative: 0
Neutro Abs: 6.2
Neutrophils Relative %: 74

## 2011-04-09 LAB — PTH, INTACT AND CALCIUM: PTH: 311.1 — ABNORMAL HIGH

## 2011-04-09 LAB — PROTIME-INR: INR: 1

## 2011-04-09 LAB — IRON AND TIBC: Iron: 83

## 2011-04-12 LAB — IRON AND TIBC
Iron: 85
TIBC: 242 — ABNORMAL LOW

## 2011-04-12 LAB — POCT HEMOGLOBIN-HEMACUE: Operator id: 181601

## 2011-04-12 LAB — PTH, INTACT AND CALCIUM: Calcium, Total (PTH): 10.4

## 2011-04-13 LAB — IRON AND TIBC
Iron: 74
Saturation Ratios: 28
TIBC: 263
UIBC: 189

## 2011-04-14 LAB — CBC
MCHC: 33.8
MCV: 82.9
Platelets: 272
RDW: 19.3 — ABNORMAL HIGH
WBC: 7.7

## 2011-04-14 LAB — IRON AND TIBC: Iron: 78

## 2011-04-15 LAB — IRON AND TIBC
Iron: 60
Iron: 56
Saturation Ratios: 25
Saturation Ratios: 25
TIBC: 239 — ABNORMAL LOW
TIBC: 221 — ABNORMAL LOW
UIBC: 179

## 2011-04-15 LAB — RENAL FUNCTION PANEL
Albumin: 3.5
BUN: 60 — ABNORMAL HIGH
CO2: 24
Calcium: 9.5
Chloride: 110
Chloride: 108
GFR calc Af Amer: 13 — ABNORMAL LOW
GFR calc non Af Amer: 12 — ABNORMAL LOW
GFR calc non Af Amer: 11 — ABNORMAL LOW
Glucose, Bld: 143 — ABNORMAL HIGH
Phosphorus: 3.8
Potassium: 3.6
Potassium: 3.4 — ABNORMAL LOW
Sodium: 142
Sodium: 141

## 2011-04-15 LAB — PTH, INTACT AND CALCIUM: PTH: 346.9 — ABNORMAL HIGH

## 2011-04-15 LAB — HEMOGLOBIN AND HEMATOCRIT, BLOOD
HCT: 31.1 — ABNORMAL LOW
HCT: 31.4 — ABNORMAL LOW
Hemoglobin: 10.1 — ABNORMAL LOW

## 2011-04-16 LAB — POCT I-STAT 4, (NA,K, GLUC, HGB,HCT)
Glucose, Bld: 122 — ABNORMAL HIGH
Glucose, Bld: 201 — ABNORMAL HIGH
Hemoglobin: 11.9 — ABNORMAL LOW
Potassium: 4.1
Sodium: 143

## 2011-04-16 LAB — CBC
HCT: 30.9 — ABNORMAL LOW
MCV: 83
Platelets: 255
RBC: 3.72 — ABNORMAL LOW
WBC: 7.4

## 2011-04-16 LAB — IRON AND TIBC
Saturation Ratios: 34
UIBC: 160

## 2011-04-16 LAB — GLUCOSE, CAPILLARY: Glucose-Capillary: 148 — ABNORMAL HIGH

## 2011-04-19 LAB — CULTURE, BLOOD (ROUTINE X 2)

## 2011-04-19 LAB — BASIC METABOLIC PANEL
BUN: 25 — ABNORMAL HIGH
BUN: 53 — ABNORMAL HIGH
CO2: 26
Chloride: 95 — ABNORMAL LOW
Chloride: 97
Creatinine, Ser: 3.82 — ABNORMAL HIGH
Creatinine, Ser: 5.92 — ABNORMAL HIGH
GFR calc Af Amer: 10 — ABNORMAL LOW
GFR calc Af Amer: 14 — ABNORMAL LOW
GFR calc Af Amer: 9 — ABNORMAL LOW
GFR calc non Af Amer: 12 — ABNORMAL LOW
Glucose, Bld: 106 — ABNORMAL HIGH
Potassium: 3.4 — ABNORMAL LOW
Potassium: 4
Sodium: 133 — ABNORMAL LOW

## 2011-04-19 LAB — GLUCOSE, CAPILLARY
Glucose-Capillary: 106 — ABNORMAL HIGH
Glucose-Capillary: 118 — ABNORMAL HIGH
Glucose-Capillary: 133 — ABNORMAL HIGH
Glucose-Capillary: 138 — ABNORMAL HIGH
Glucose-Capillary: 74
Glucose-Capillary: 76
Glucose-Capillary: 97

## 2011-04-19 LAB — CBC
HCT: 35.3 — ABNORMAL LOW
HCT: 36.3
Hemoglobin: 10.9 — ABNORMAL LOW
Hemoglobin: 11.1 — ABNORMAL LOW
MCHC: 31.5
MCHC: 32.4
MCV: 83.3
MCV: 83.6
MCV: 83.8
Platelets: 263
RBC: 4.31
RBC: 4.33
RBC: 5.07
RDW: 20.1 — ABNORMAL HIGH
RDW: 20.2 — ABNORMAL HIGH
RDW: 21 — ABNORMAL HIGH
WBC: 19 — ABNORMAL HIGH
WBC: 6.8

## 2011-04-19 LAB — RENAL FUNCTION PANEL
BUN: 61 — ABNORMAL HIGH
BUN: 74 — ABNORMAL HIGH
Calcium: 9.7
Chloride: 99
Creatinine, Ser: 6.55 — ABNORMAL HIGH
Glucose, Bld: 137 — ABNORMAL HIGH
Glucose, Bld: 98
Phosphorus: 6.1 — ABNORMAL HIGH
Phosphorus: 6.4 — ABNORMAL HIGH
Potassium: 4
Sodium: 136

## 2011-04-19 LAB — DIFFERENTIAL
Lymphocytes Relative: 5 — ABNORMAL LOW
Lymphs Abs: 0.9
Neutrophils Relative %: 88 — ABNORMAL HIGH

## 2011-04-19 LAB — WOUND CULTURE

## 2011-04-19 LAB — CK TOTAL AND CKMB (NOT AT ARMC): Total CK: 115

## 2011-04-19 LAB — CARDIAC PANEL(CRET KIN+CKTOT+MB+TROPI)
CK, MB: 2.4
Relative Index: 1.7
Troponin I: 0.05

## 2011-04-19 LAB — POCT CARDIAC MARKERS
CKMB, poc: 1.1
CKMB, poc: 1.2
Myoglobin, poc: >500
Myoglobin, poc: >500
Troponin i, poc: <0.05

## 2011-04-19 LAB — B-NATRIURETIC PEPTIDE (CONVERTED LAB): Pro B Natriuretic peptide (BNP): 252 — ABNORMAL HIGH

## 2011-04-21 LAB — POCT I-STAT 4, (NA,K, GLUC, HGB,HCT)
Glucose, Bld: 108 — ABNORMAL HIGH
Glucose, Bld: 95
HCT: 37
HCT: 37
Hemoglobin: 12.6
Potassium: 4
Potassium: 4
Sodium: 137
Sodium: 140

## 2011-04-21 LAB — GLUCOSE, CAPILLARY: Glucose-Capillary: 104 — ABNORMAL HIGH

## 2011-04-22 LAB — GLUCOSE, CAPILLARY
Glucose-Capillary: 105 mg/dL — ABNORMAL HIGH (ref 70–99)
Glucose-Capillary: 113 mg/dL — ABNORMAL HIGH (ref 70–99)
Glucose-Capillary: 113 mg/dL — ABNORMAL HIGH (ref 70–99)
Glucose-Capillary: 115 mg/dL — ABNORMAL HIGH (ref 70–99)
Glucose-Capillary: 131 mg/dL — ABNORMAL HIGH (ref 70–99)
Glucose-Capillary: 138 mg/dL — ABNORMAL HIGH (ref 70–99)
Glucose-Capillary: 138 mg/dL — ABNORMAL HIGH (ref 70–99)
Glucose-Capillary: 138 mg/dL — ABNORMAL HIGH (ref 70–99)
Glucose-Capillary: 140 mg/dL — ABNORMAL HIGH (ref 70–99)
Glucose-Capillary: 141 mg/dL — ABNORMAL HIGH (ref 70–99)
Glucose-Capillary: 147 mg/dL — ABNORMAL HIGH (ref 70–99)
Glucose-Capillary: 156 mg/dL — ABNORMAL HIGH (ref 70–99)
Glucose-Capillary: 160 mg/dL — ABNORMAL HIGH (ref 70–99)
Glucose-Capillary: 182 mg/dL — ABNORMAL HIGH (ref 70–99)
Glucose-Capillary: 189 mg/dL — ABNORMAL HIGH (ref 70–99)
Glucose-Capillary: 216 mg/dL — ABNORMAL HIGH (ref 70–99)
Glucose-Capillary: 235 mg/dL — ABNORMAL HIGH (ref 70–99)

## 2011-04-22 LAB — DIFFERENTIAL
Basophils Absolute: 0 10*3/uL (ref 0.0–0.1)
Basophils Relative: 0 % (ref 0–1)
Eosinophils Absolute: 0.1 10*3/uL (ref 0.0–0.7)
Neutro Abs: 10 10*3/uL — ABNORMAL HIGH (ref 1.7–7.7)
Neutrophils Relative %: 84 % — ABNORMAL HIGH (ref 43–77)

## 2011-04-22 LAB — RENAL FUNCTION PANEL
Albumin: 2.8 g/dL — ABNORMAL LOW (ref 3.5–5.2)
Albumin: 2.9 g/dL — ABNORMAL LOW (ref 3.5–5.2)
BUN: 52 mg/dL — ABNORMAL HIGH (ref 6–23)
BUN: 64 mg/dL — ABNORMAL HIGH (ref 6–23)
CO2: 23 mEq/L (ref 19–32)
Calcium: 10 mg/dL (ref 8.4–10.5)
Calcium: 9.4 mg/dL (ref 8.4–10.5)
Chloride: 101 mEq/L (ref 96–112)
Creatinine, Ser: 6.22 mg/dL — ABNORMAL HIGH (ref 0.4–1.2)
GFR calc Af Amer: 8 mL/min — ABNORMAL LOW (ref >60)
GFR calc Af Amer: 9 mL/min — ABNORMAL LOW (ref >60)
GFR calc Af Amer: 9 mL/min — ABNORMAL LOW (ref >60)
GFR calc non Af Amer: 7 mL/min — ABNORMAL LOW (ref >60)
GFR calc non Af Amer: 8 mL/min — ABNORMAL LOW (ref >60)
Glucose, Bld: 111 mg/dL — ABNORMAL HIGH (ref 70–99)
Phosphorus: 4.8 mg/dL — ABNORMAL HIGH (ref 2.3–4.6)
Phosphorus: 4.8 mg/dL — ABNORMAL HIGH (ref 2.3–4.6)
Potassium: 3.7 mEq/L (ref 3.5–5.1)
Potassium: 4.2 mEq/L (ref 3.5–5.1)
Sodium: 132 mEq/L — ABNORMAL LOW (ref 135–145)
Sodium: 138 mEq/L (ref 135–145)

## 2011-04-22 LAB — CBC
HCT: 32.4 % — ABNORMAL LOW (ref 36.0–46.0)
HCT: 33.7 % — ABNORMAL LOW (ref 36.0–46.0)
Hemoglobin: 11.8 g/dL — ABNORMAL LOW (ref 12.0–15.0)
MCHC: 31.6 g/dL (ref 30.0–36.0)
MCHC: 31.7 g/dL (ref 30.0–36.0)
MCHC: 32.7 g/dL (ref 30.0–36.0)
MCV: 80 fL (ref 78.0–100.0)
MCV: 80.4 fL (ref 78.0–100.0)
Platelets: 331 10*3/uL (ref 150–400)
RBC: 4.16 MIL/uL (ref 3.87–5.11)
RBC: 4.21 MIL/uL (ref 3.87–5.11)
RDW: 20.9 % — ABNORMAL HIGH (ref 11.5–15.5)
RDW: 21.4 % — ABNORMAL HIGH (ref 11.5–15.5)
RDW: 21.5 % — ABNORMAL HIGH (ref 11.5–15.5)
RDW: 21.6 % — ABNORMAL HIGH (ref 11.5–15.5)
WBC: 10.1 10*3/uL (ref 4.0–10.5)
WBC: 11 10*3/uL — ABNORMAL HIGH (ref 4.0–10.5)
WBC: 11.1 10*3/uL — ABNORMAL HIGH (ref 4.0–10.5)

## 2011-04-22 LAB — BASIC METABOLIC PANEL
BUN: 61 mg/dL — ABNORMAL HIGH (ref 6–23)
CO2: 20 mEq/L (ref 19–32)
Calcium: 9.4 mg/dL (ref 8.4–10.5)
Calcium: 9.9 mg/dL (ref 8.4–10.5)
Creatinine, Ser: 5.99 mg/dL — ABNORMAL HIGH (ref 0.4–1.2)
GFR calc Af Amer: 8 mL/min — ABNORMAL LOW (ref >60)
GFR calc non Af Amer: 7 mL/min — ABNORMAL LOW (ref >60)
GFR calc non Af Amer: 7 mL/min — ABNORMAL LOW (ref >60)
Glucose, Bld: 125 mg/dL — ABNORMAL HIGH (ref 70–99)
Potassium: 3.9 mEq/L (ref 3.5–5.1)
Potassium: 4 mEq/L (ref 3.5–5.1)
Sodium: 133 mEq/L — ABNORMAL LOW (ref 135–145)

## 2011-04-22 LAB — CULTURE, BLOOD (ROUTINE X 2): Culture: NO GROWTH

## 2011-04-22 LAB — POCT I-STAT 3, ART BLOOD GAS (G3+)
Bicarbonate: 23.9 mEq/L (ref 20.0–24.0)
O2 Saturation: 93 %
TCO2: 25 mmol/L (ref 0–100)
pCO2 arterial: 34.6 mmHg — ABNORMAL LOW (ref 35.0–45.0)
pH, Arterial: 7.446 — ABNORMAL HIGH (ref 7.350–7.400)
pO2, Arterial: 64 mmHg — ABNORMAL LOW (ref 80.0–100.0)

## 2011-04-22 LAB — POCT I-STAT, CHEM 8
Calcium, Ion: 1.24 mmol/L (ref 1.12–1.32)
Chloride: 105 mEq/L (ref 96–112)
Glucose, Bld: 124 mg/dL — ABNORMAL HIGH (ref 70–99)
HCT: 43 % (ref 36.0–46.0)
Hemoglobin: 14.6 g/dL (ref 12.0–15.0)
Potassium: 4 mEq/L (ref 3.5–5.1)

## 2011-04-22 LAB — CARDIAC PANEL(CRET KIN+CKTOT+MB+TROPI)
CK, MB: 2 ng/mL (ref 0.3–4.0)
CK, MB: 2.1 ng/mL (ref 0.3–4.0)
Relative Index: INVALID (ref 0.0–2.5)
Relative Index: INVALID (ref 0.0–2.5)
Total CK: 79 U/L (ref 7–177)
Troponin I: 0.3 ng/mL — ABNORMAL HIGH (ref 0.00–0.06)
Troponin I: 0.31 ng/mL — ABNORMAL HIGH (ref 0.00–0.06)

## 2011-04-22 LAB — URINALYSIS, ROUTINE W REFLEX MICROSCOPIC
Bilirubin Urine: NEGATIVE
Glucose, UA: NEGATIVE mg/dL
Hgb urine dipstick: NEGATIVE
Ketones, ur: NEGATIVE mg/dL
Protein, ur: >300 mg/dL — AB
Urobilinogen, UA: 0.2 mg/dL (ref 0.0–1.0)

## 2011-04-22 LAB — TROPONIN I: Troponin I: 0.29 ng/mL — ABNORMAL HIGH (ref 0.00–0.06)

## 2011-04-22 LAB — COMPREHENSIVE METABOLIC PANEL
ALT: 32 U/L (ref 0–35)
CO2: 23 mEq/L (ref 19–32)
Calcium: 9.9 mg/dL (ref 8.4–10.5)
GFR calc Af Amer: 12 mL/min — ABNORMAL LOW (ref >60)
GFR calc non Af Amer: 10 mL/min — ABNORMAL LOW (ref >60)
Glucose, Bld: 74 mg/dL (ref 70–99)
Sodium: 136 mEq/L (ref 135–145)
Total Bilirubin: 0.5 mg/dL (ref 0.3–1.2)
Total Protein: 6.8 g/dL (ref 6.0–8.3)

## 2011-04-22 LAB — LIPID PANEL
Cholesterol: 115 mg/dL (ref 0–200)
HDL: 39 mg/dL — ABNORMAL LOW (ref >39)
LDL Cholesterol: 47 mg/dL (ref 0–99)
Total CHOL/HDL Ratio: 2.6 RATIO
Total CHOL/HDL Ratio: 2.7 RATIO
VLDL: 16 mg/dL (ref 0–40)
VLDL: 18 mg/dL (ref 0–40)

## 2011-04-22 LAB — HEMOGLOBIN A1C
Hgb A1c MFr Bld: 5.7 % (ref 4.6–6.1)
Mean Plasma Glucose: 117 mg/dL

## 2011-04-22 LAB — PROTIME-INR
INR: 1.1 (ref 0.00–1.49)
INR: 1.6 — ABNORMAL HIGH (ref 0.00–1.49)
Prothrombin Time: 14.6 seconds (ref 11.6–15.2)
Prothrombin Time: 18.4 seconds — ABNORMAL HIGH (ref 11.6–15.2)
Prothrombin Time: 19.5 seconds — ABNORMAL HIGH (ref 11.6–15.2)

## 2011-04-22 LAB — URINE CULTURE: Colony Count: >=100000

## 2011-04-22 LAB — CATH TIP CULTURE: Culture: NO GROWTH

## 2011-04-22 LAB — CK TOTAL AND CKMB (NOT AT ARMC): Relative Index: 3 — ABNORMAL HIGH (ref 0.0–2.5)

## 2011-04-22 LAB — B-NATRIURETIC PEPTIDE (CONVERTED LAB): Pro B Natriuretic peptide (BNP): 208 pg/mL — ABNORMAL HIGH (ref 0.0–100.0)

## 2011-04-22 LAB — HOMOCYSTEINE: Homocysteine: 11.7 umol/L (ref 4.0–15.4)

## 2011-04-22 LAB — POCT CARDIAC MARKERS
CKMB, poc: 5.8 ng/mL (ref 1.0–8.0)
Troponin i, poc: 0.16 ng/mL — ABNORMAL HIGH (ref 0.00–0.09)

## 2011-04-22 LAB — HEMOGLOBIN
Hemoglobin: 10.3 g/dL — ABNORMAL LOW (ref 12.0–15.0)
Hemoglobin: 10.9 g/dL — ABNORMAL LOW (ref 12.0–15.0)

## 2011-04-23 LAB — RENAL FUNCTION PANEL
CO2: 24
Chloride: 105
Creatinine, Ser: 3.99 — ABNORMAL HIGH
GFR calc Af Amer: 14 — ABNORMAL LOW
GFR calc non Af Amer: 11 — ABNORMAL LOW

## 2011-04-23 LAB — IRON AND TIBC
Iron: 84
Saturation Ratios: 35

## 2011-04-23 LAB — CBC
HCT: 31.6 — ABNORMAL LOW
Platelets: 267
RDW: 18.8 — ABNORMAL HIGH

## 2011-04-26 DIAGNOSIS — E876 Hypokalemia: Secondary | ICD-10-CM | POA: Insufficient documentation

## 2011-04-26 DIAGNOSIS — E1159 Type 2 diabetes mellitus with other circulatory complications: Secondary | ICD-10-CM | POA: Insufficient documentation

## 2011-04-27 LAB — CBC
HCT: 31.5 — ABNORMAL LOW
Hemoglobin: 10.4 — ABNORMAL LOW
RDW: 19.2 — ABNORMAL HIGH
WBC: 7.5

## 2011-04-27 LAB — IRON AND TIBC
Saturation Ratios: 26
UIBC: 201

## 2011-04-28 LAB — IRON AND TIBC
Iron: 77
Saturation Ratios: 28
TIBC: 275
UIBC: 198

## 2011-04-28 LAB — PTH, INTACT AND CALCIUM: PTH: 323.4 — ABNORMAL HIGH

## 2011-04-28 LAB — CBC
RBC: 4.24
WBC: 6.9

## 2011-04-29 LAB — CBC
MCHC: 32.6
RDW: 18.4 — ABNORMAL HIGH

## 2011-04-29 LAB — IRON AND TIBC
Iron: 73
TIBC: 291

## 2011-04-30 LAB — RENAL FUNCTION PANEL
CO2: 20
Calcium: 9.6
Chloride: 110
GFR calc Af Amer: 16 — ABNORMAL LOW
GFR calc non Af Amer: 13 — ABNORMAL LOW
Sodium: 138

## 2011-04-30 LAB — CBC
HCT: 28 — ABNORMAL LOW
Hemoglobin: 9.2 — ABNORMAL LOW
MCHC: 33
RDW: 16.8 — ABNORMAL HIGH

## 2011-04-30 LAB — IRON AND TIBC: Saturation Ratios: 26

## 2011-05-13 DIAGNOSIS — D631 Anemia in chronic kidney disease: Secondary | ICD-10-CM | POA: Insufficient documentation

## 2011-05-25 ENCOUNTER — Ambulatory Visit (INDEPENDENT_AMBULATORY_CARE_PROVIDER_SITE_OTHER): Payer: Medicare Other | Admitting: Internal Medicine

## 2011-05-25 ENCOUNTER — Encounter: Payer: Self-pay | Admitting: Internal Medicine

## 2011-05-25 DIAGNOSIS — M545 Low back pain, unspecified: Secondary | ICD-10-CM

## 2011-05-25 DIAGNOSIS — R5381 Other malaise: Secondary | ICD-10-CM

## 2011-05-25 DIAGNOSIS — L299 Pruritus, unspecified: Secondary | ICD-10-CM

## 2011-05-25 DIAGNOSIS — N19 Unspecified kidney failure: Secondary | ICD-10-CM

## 2011-05-25 DIAGNOSIS — K117 Disturbances of salivary secretion: Secondary | ICD-10-CM

## 2011-05-25 DIAGNOSIS — N186 End stage renal disease: Secondary | ICD-10-CM

## 2011-05-25 NOTE — Assessment & Plan Note (Signed)
Continue with current prescription therapy as reflected on the Med list.  

## 2011-05-25 NOTE — Assessment & Plan Note (Signed)
Chronic and multifactorial

## 2011-05-25 NOTE — Progress Notes (Signed)
  Subjective:    [name], female    DOB: Jul 09, 1946, 65 y.o.   MRN: [id number]  HPI  The patient presents for a follow-up of  Chronic ESRD, hypertension, chronic dyslipidemia, type 2 diabetes controlled with medicines. She just had a renal tranplant in 8/12. C/o itching and LE weakness...    Review of Systems  Constitutional: Positive for appetite change and fatigue. Negative for chills, activity change and unexpected weight change.  HENT: Negative for congestion, mouth sores and sinus pressure.   Eyes: Negative for visual disturbance.  Respiratory: Negative for cough and chest tightness.   Gastrointestinal: Negative for nausea and abdominal pain.  Genitourinary: Negative for frequency, difficulty urinating and vaginal pain.  Musculoskeletal: Positive for arthralgias and gait problem. Negative for back pain.  Skin: Negative for pallor and rash.       pruritis  Neurological: Negative for dizziness, tremors, weakness, numbness and headaches.  Psychiatric/Behavioral: Negative for confusion and sleep disturbance. The patient is nervous/anxious.        Objective:   Physical Exam  Constitutional: No distress.       Chronically ill appearing -- in a w/c  HENT:  Head: Normocephalic.  Right Ear: External ear normal.  Left Ear: External ear normal.  Nose: Nose normal.  Mouth/Throat: Oropharynx is clear and moist.  Eyes: Conjunctivae are normal. Pupils are equal, round, and reactive to light. Right eye exhibits no discharge. Left eye exhibits no discharge.  Neck: Normal range of motion. Neck supple. No JVD present. No tracheal deviation present. No thyromegaly present.  Cardiovascular: Normal rate, regular rhythm and normal heart sounds.   Pulmonary/Chest: No stridor. No respiratory distress. She has no wheezes.  Abdominal: Soft. Bowel sounds are normal. She exhibits no distension and no mass. There is no tenderness. There is no rebound and no guarding.  Musculoskeletal:  She exhibits no edema and no tenderness.  Lymphadenopathy:    She has no cervical adenopathy.  Neurological: She displays normal reflexes. No cranial nerve deficit. She exhibits normal muscle tone. Coordination normal.  Skin: No rash noted. No erythema.  Psychiatric: She has a normal mood and affect. Her behavior is normal. Thought content normal.       Sad a little          Assessment & Plan:

## 2011-05-25 NOTE — Assessment & Plan Note (Signed)
Resolved in 2012. Restarted in 9/12  ?prograf related

## 2011-05-25 NOTE — Assessment & Plan Note (Signed)
On HD till 8/12 Transplant 8/12 

## 2011-05-25 NOTE — Assessment & Plan Note (Signed)
Better  

## 2011-07-05 ENCOUNTER — Telehealth: Payer: Self-pay | Admitting: Cardiology

## 2011-07-05 ENCOUNTER — Other Ambulatory Visit: Payer: Self-pay | Admitting: Internal Medicine

## 2011-07-05 ENCOUNTER — Other Ambulatory Visit: Payer: Self-pay

## 2011-07-05 DIAGNOSIS — I350 Nonrheumatic aortic (valve) stenosis: Secondary | ICD-10-CM

## 2011-07-05 DIAGNOSIS — Z1231 Encounter for screening mammogram for malignant neoplasm of breast: Secondary | ICD-10-CM

## 2011-07-05 NOTE — Telephone Encounter (Signed)
New msg Pt's husband called He said she is having weakness in her legs and she is having some shortness of breath. Its been going on for about two months. Please call back

## 2011-07-05 NOTE — Telephone Encounter (Signed)
Pt complaining of increased sob past month and increased weakness off and on past month approx.  Mr Mcfaul states that she had a kidney transplant about 4 months ago and her kidney physician recommended that she see her cardiologist.  No other symptoms currently.    Appt with Dr [name] on 08/06/11 and she is to have an echo prior per Dr [name].  Call if increased symptoms between now and appt.  Mr Faughnan agrees.

## 2011-07-08 ENCOUNTER — Ambulatory Visit (HOSPITAL_COMMUNITY): Payer: Medicare Other | Attending: Cardiology

## 2011-07-08 ENCOUNTER — Telehealth: Payer: Self-pay | Admitting: *Deleted

## 2011-07-08 DIAGNOSIS — Z940 Kidney transplant status: Secondary | ICD-10-CM | POA: Insufficient documentation

## 2011-07-08 DIAGNOSIS — N186 End stage renal disease: Secondary | ICD-10-CM | POA: Insufficient documentation

## 2011-07-08 DIAGNOSIS — I350 Nonrheumatic aortic (valve) stenosis: Secondary | ICD-10-CM

## 2011-07-08 DIAGNOSIS — R0609 Other forms of dyspnea: Secondary | ICD-10-CM | POA: Insufficient documentation

## 2011-07-08 DIAGNOSIS — I359 Nonrheumatic aortic valve disorder, unspecified: Secondary | ICD-10-CM | POA: Insufficient documentation

## 2011-07-08 DIAGNOSIS — E785 Hyperlipidemia, unspecified: Secondary | ICD-10-CM | POA: Insufficient documentation

## 2011-07-08 DIAGNOSIS — R0989 Other specified symptoms and signs involving the circulatory and respiratory systems: Secondary | ICD-10-CM | POA: Insufficient documentation

## 2011-07-08 DIAGNOSIS — I079 Rheumatic tricuspid valve disease, unspecified: Secondary | ICD-10-CM | POA: Insufficient documentation

## 2011-07-08 DIAGNOSIS — R5381 Other malaise: Secondary | ICD-10-CM | POA: Insufficient documentation

## 2011-07-08 NOTE — Telephone Encounter (Signed)
I called the patient per Dr. Jacalyn Lefevre request. He has read her echo and there is evidence of a pericardial effusion. Dr. [name] wants her to see [facility] tomorrow. Per the patient, 12:30pm is the earliest she can be here because of ride issues. She has been scheduled for a 12:30pm appt with Richardson Dopp, PA tomorrow.

## 2011-07-09 ENCOUNTER — Encounter: Payer: Self-pay | Admitting: Physician Assistant

## 2011-07-09 ENCOUNTER — Ambulatory Visit (INDEPENDENT_AMBULATORY_CARE_PROVIDER_SITE_OTHER): Payer: Medicare Other | Admitting: Physician Assistant

## 2011-07-09 VITALS — BP 92/58 | HR 83 | Ht 64.0 in | Wt 150.8 lb

## 2011-07-09 DIAGNOSIS — I4891 Unspecified atrial fibrillation: Secondary | ICD-10-CM

## 2011-07-09 DIAGNOSIS — I2510 Atherosclerotic heart disease of native coronary artery without angina pectoris: Secondary | ICD-10-CM

## 2011-07-09 DIAGNOSIS — I509 Heart failure, unspecified: Secondary | ICD-10-CM

## 2011-07-09 DIAGNOSIS — D649 Anemia, unspecified: Secondary | ICD-10-CM

## 2011-07-09 DIAGNOSIS — I359 Nonrheumatic aortic valve disorder, unspecified: Secondary | ICD-10-CM

## 2011-07-09 DIAGNOSIS — R0602 Shortness of breath: Secondary | ICD-10-CM

## 2011-07-09 DIAGNOSIS — R5381 Other malaise: Secondary | ICD-10-CM

## 2011-07-09 DIAGNOSIS — I10 Essential (primary) hypertension: Secondary | ICD-10-CM

## 2011-07-09 DIAGNOSIS — N186 End stage renal disease: Secondary | ICD-10-CM

## 2011-07-09 DIAGNOSIS — I319 Disease of pericardium, unspecified: Secondary | ICD-10-CM

## 2011-07-09 MED ORDER — NIFEDIPINE ER OSMOTIC RELEASE 30 MG PO TB24: 30.0000 mg | ORAL_TABLET | Freq: Every day | ORAL | Status: DC

## 2011-07-09 MED ORDER — LABETALOL HCL 100 MG PO TABS: 100.0000 mg | ORAL_TABLET | Freq: Two times a day (BID) | ORAL | Status: DC

## 2011-07-09 NOTE — Assessment & Plan Note (Signed)
As noted, her records will be obtained from [facility].

## 2011-07-09 NOTE — Assessment & Plan Note (Signed)
Stable.  No angina  

## 2011-07-09 NOTE — Assessment & Plan Note (Signed)
Status post renal transplant 

## 2011-07-09 NOTE — Patient Instructions (Addendum)
Your physician recommends that you schedule a follow-up appointment in: 4 weeks with Dr. [name]  Your physician has recommended you make the following change in your medication: DECREASE Labetolol to 100 mg twice daily. This Rx has been sent into your pharmacy DECREASE Procardia to 30 mg, one tablet every day.  This prescription has been sent into your pharmacy.

## 2011-07-09 NOTE — Progress Notes (Signed)
[place], [place]  [address] Phone: [phone] Fax:  [phone]  Date:  07/09/2011   Name:  [name]       DOB:  06-09-1946 MRN:  [id number]  PCP:  Dr. Alain Marion Primary Cardiologist:  Dr. [name]  Primary Electrophysiologist:  None    History of Present Illness: [name] is a 65 y.o. female who presents for dyspnea and weakness.  She has a history of coronary artery disease, status post PCI of the right coronary artery. Last LHC 5/05: Mid RCA 40%, EF 60%.  Her last Myoview /12:  Anterior defect consistent with breast attenuation, EF 55% The patient apparently was admitted in December of 123XX123 with an embolic CVA.  An echocardiogram showed normal LV function and a small pericardial effusion. She subsequently had a TEE which revealed normal LV function and a moderate effusion. There were no vegetations. She also apparently was placed on Coumadin with a diagnosis of paroxysmal atrial fibrillation. Carotid Dopplers performed in August 2011 showed 40-59% left stenosis and 0-39% right; f/u recommended in 2 years. Repeat echocardiogram in Oct 2011 revealed normal LV function, mild aortic stenosis with a mean gradient of 17 mm of mercury, mild mitral regurgitation, grade 2 diastolic dysfunction and a moderate pericardial effusion. There was moderate left atrial enlargement.  She was last seen by Dr. [name] 2/12.  An echocardiogram at [facility] apparently demonstrated severe AS.  TEE was done to follow up in 3/12: EF 60-65%, moderate AS, mild-moderate LAE, small pericardial effusion, mean AV gradient 17 mm of mercury.  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  She really called in with weakness and dyspnea.  She was set up for an echocardiogram which was done yesterday.  She has a pericardial effusion and was brought in today for follow up.   2D Echo 07/08/11: Study Conclusions  - Left ventricle: The cavity size was normal. Wall thickness  was increased in a pattern of severe LVH. Systolic function was normal. The estimated ejection fraction was in the range of 55% to 60%. Wall motion was normal; there were no regional wall motion abnormalities. - Aortic valve: There was mild stenosis. - Left atrium: The atrium was moderately dilated. - Pericardium, extracardiac:  Moderate circumferential pericardial effusion. There appears to be late diastolic collapse of the right ventricle although her rapid rate makes evaluation difficult. There is also greater than 25% change in E wave amplitude with respiration suggestive of possible early pericardial tamponade. Clinical correlation suggested. A pericardial effusion was identified.  She has been weak since her transplant.  She apparently had significant anemia after her transplant and required transfusion on several occasions.  The source of her bleeding is unknown.  We will request records.  Her transplant physicians have asked her to followup with Dr. [name] regarding whether or not she can go back on her Coumadin now.  She denies chest pain.  She denies orthopnea, PND or edema.  She does have some dyspnea with exertion.  Some days are better than others.  She denies syncope.  The patient was also seen by Dr. [name] today.  We reviewed her prior echocardiograms.  Past Medical History  Diagnosis Date  . Coronary artery disease 05/2002    s/p acute inf, MI  . Paroxysmal a-fib   . Myocardial infarction     hx of  . Hypertension   . Hyperlipidemia   . CHF (congestive heart failure)   . Renal failure  end stage renal disease;  M,W,F DIALYSIS  . Pericardial effusion   . 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 GERD (gastroesophageal reflux disease)   . Malignant neoplasm of kidney   . 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 Unspecified hearing loss   . Diabetes mellitus     type 2  . Anemia, iron deficiency     of  chronic disease  . Helicobacter pylori (H. pylori) infection     hx of  . Esophagitis, reflux   . Gout   . Sleep apnea   . Cholelithiasis   . Hx of colonic polyps   . Diverticulosis of colon   . Streptococcal infection group D enterococcus   . Chronic cough onset 03/2010    Dr Melvyn Novas    Current Outpatient Prescriptions  Medication Sig Dispense Refill  . ascorbic Acid (VITAMIN C) 500 MG CPCR Take 500 mg by mouth daily.        Marland Kitchen aspirin 81 MG tablet Take 81 mg by mouth daily.        . B Complex-C-Folic Acid (RENAL MULTIVITAMIN FORMULA) TABS Take 1 tablet by mouth daily.        . Calcium Acetate, Phos Binder, (PHOSLYRA) 667 MG/5ML SOLN 1 tablespoon before each meal      . cinacalcet (SENSIPAR) 30 MG tablet Take 2 tablets (60 mg total) by mouth daily.      . cloNIDine (CATAPRES) 0.2 MG tablet 1/2 tab by mouth with breakfast and at bedtime      . dapsone 25 MG tablet Take 25 mg by mouth daily.        Marland Kitchen docusate sodium (COLACE) 100 MG capsule Take 100 mg by mouth 2 (two) times daily.        Marland Kitchen epoetin alfa (EPOGEN) 4000 UNIT/ML injection Take as directed on Mon, Wed and Fri for dialysis      . esomeprazole (NEXIUM) 40 MG capsule Take one 30-60 min before 1st meal of the day      . famotidine (PEPCID) 20 MG tablet Take 20 mg by mouth at bedtime.        Marland Kitchen glipiZIDE (GLUCOTROL XL) 10 MG 24 hr tablet Take 10 mg by mouth 2 (two) times daily.        . Glucosamine 500 MG CAPS Take 1 capsule (500 mg total) by mouth daily.    0  . glucose blood test strip 1 each by Other route as needed. Test 2 times a day as needed       . HYDROcodone-acetaminophen (NORCO) 5-325 MG per tablet Take 1-2 tablets by mouth every 6 (six) hours as needed.        . labetalol (NORMODYNE) 300 MG tablet Take 150 mg by mouth 2 (two) times daily.        Marland Kitchen levETIRAcetam (KEPPRA) 250 MG tablet Take 1 tablet (250 mg total) by mouth at bedtime.      . magnesium oxide (MAG-OX) 400 MG tablet Take 800 mg by mouth 2 (two) times daily.         . Menthol-Methyl Salicylate (ICY HOT) Q000111Q % STCK Apply 1 application topically 4 (four) times daily as needed.  1 each  11  . mycophenolate (MYFORTIC) 180 MG EC tablet Take 360 mg by mouth 2 (two) times daily.        . nebivolol (BYSTOLIC) 10 MG tablet Take 10 mg by mouth  2 (two) times daily.        Marland Kitchen NIFEdipine (PROCARDIA-XL/ADALAT CC) 60 MG 24 hr tablet Take 60 mg by mouth daily.        . paricalcitol (ZEMPLAR) 2 MCG/ML injection Take on Mon, Wed, Fri for dialysis      . POLYETHYLENE GLYCOL 3350 PO Take 17 g by mouth daily as needed.        . pravastatin (PRAVACHOL) 40 MG tablet Take 1 tablet (40 mg total) by mouth at bedtime.      . tacrolimus (PROGRAF) 1 MG capsule Take 5 mg by mouth 2 (two) times daily.        . traMADol (ULTRAM) 50 MG tablet Take 1 tablet (50 mg total) by mouth 2 (two) times daily as needed for pain.  100 tablet  3  . traZODone (DESYREL) 50 MG tablet Take 50 mg by mouth at bedtime.        . valsartan (DIOVAN) 160 MG tablet 1 tablet daily- only on days when does not have dialysis- tues, thurs, sat, sun      . warfarin (COUMADIN) 5 MG tablet Take as directed per coumadin clinic        Allergies: Allergies  Allergen Reactions  . Bactrim Swelling  . Sulfamethoxazole W/Trimethoprim   . Sulfonamide Derivatives     History  Substance Use Topics  . Smoking status: Former Smoker -- 1.0 packs/day for 30 years    Types: Cigarettes    Quit date: 07/19/2001  . Smokeless tobacco: Not on file  . Alcohol Use: No     ROS:  Please see the history of present illness.   All other systems reviewed and negative.   PHYSICAL EXAM: VS:  BP 92/58  Pulse 83  Ht 5\' 4"  (1.626 m)  Wt 150 lb 12.8 oz (68.402 kg)  BMI 25.88 kg/m2 Well nourished, well developed, in no acute distress HEENT: normal Neck: no JVD Cardiac:  normal S1, S2; RRR; no murmur Lungs:  clear to auscultation bilaterally, no wheezing, rhonchi or rales Abd: soft, nontender, no hepatomegaly Ext: no  edema Skin: warm and dry Neuro:  CNs 2-12 intact, no focal abnormalities noted  EKG:   Sinus rhythm, heart rate 83, normal axis, PACs, T-wave inversions in leads V4-V6  ASSESSMENT AND PLAN:

## 2011-07-09 NOTE — Assessment & Plan Note (Signed)
Mild by most recent assessment.

## 2011-07-09 NOTE — Assessment & Plan Note (Addendum)
Labetalol was started back yesterday.  She is now hypotensive.  Adjust labetalol to 100 mg twice a day.  Decrease nifedipine to 30 mg a day.  She will monitor her blood pressures at home and notify us if they're running too high.

## 2011-07-09 NOTE — Assessment & Plan Note (Signed)
As noted, we reviewed her old echocardiograms.  The patient was also seen by Dr. [name].  She did not have any clinical signs or symptoms of cardiac tamponade.  Her effusion has essentially been unchanged dating back to 2007.  No further intervention is required at this time.

## 2011-07-09 NOTE — Assessment & Plan Note (Signed)
Likely related to deconditioning from her transplant surgery.  Increased activity has been recommended.

## 2011-07-09 NOTE — Assessment & Plan Note (Signed)
She has a history of stroke.  We will obtain her records from [facility] for further review as she notes a significant history of anemia recently.  If it appears safe for her to restart Coumadin, this will be initiated.  Followup with Dr. [name] in 4 weeks.

## 2011-07-16 NOTE — Progress Notes (Signed)
Addended by: Marlis Edelson C on: 07/16/2011 12:07 PM   Modules accepted: Orders

## 2011-07-22 ENCOUNTER — Ambulatory Visit
Admission: RE | Admit: 2011-07-22 | Discharge: 2011-07-22 | Disposition: A | Discharge status: Home / Self Care | Payer: Medicare Other | Source: Ambulatory Visit | Attending: Internal Medicine | Admitting: Internal Medicine

## 2011-07-22 DIAGNOSIS — Z1231 Encounter for screening mammogram for malignant neoplasm of breast: Secondary | ICD-10-CM | POA: Diagnosis not present

## 2011-07-26 DIAGNOSIS — I10 Essential (primary) hypertension: Secondary | ICD-10-CM | POA: Diagnosis not present

## 2011-07-26 DIAGNOSIS — Z940 Kidney transplant status: Secondary | ICD-10-CM | POA: Diagnosis not present

## 2011-07-26 DIAGNOSIS — N2581 Secondary hyperparathyroidism of renal origin: Secondary | ICD-10-CM | POA: Diagnosis not present

## 2011-07-26 DIAGNOSIS — E119 Type 2 diabetes mellitus without complications: Secondary | ICD-10-CM | POA: Diagnosis not present

## 2011-07-26 DIAGNOSIS — D649 Anemia, unspecified: Secondary | ICD-10-CM | POA: Diagnosis not present

## 2011-07-26 DIAGNOSIS — Z79899 Other long term (current) drug therapy: Secondary | ICD-10-CM | POA: Diagnosis not present

## 2011-07-29 DIAGNOSIS — Z940 Kidney transplant status: Secondary | ICD-10-CM | POA: Diagnosis not present

## 2011-07-29 DIAGNOSIS — E1129 Type 2 diabetes mellitus with other diabetic kidney complication: Secondary | ICD-10-CM | POA: Diagnosis not present

## 2011-07-29 DIAGNOSIS — M109 Gout, unspecified: Secondary | ICD-10-CM | POA: Diagnosis not present

## 2011-07-29 DIAGNOSIS — I2510 Atherosclerotic heart disease of native coronary artery without angina pectoris: Secondary | ICD-10-CM | POA: Diagnosis not present

## 2011-08-04 ENCOUNTER — Telehealth: Payer: Self-pay | Admitting: *Deleted

## 2011-08-04 NOTE — Telephone Encounter (Signed)
Rf req for Trazodone 50 mg 2 po qhs # 60. Last filled 11.12.12. Ok to Rf?

## 2011-08-04 NOTE — Telephone Encounter (Signed)
OK to fill this prescription with additional refills x5 Thank you!  

## 2011-08-06 ENCOUNTER — Ambulatory Visit: Payer: Medicare Other | Admitting: Cardiology

## 2011-08-06 MED ORDER — TRAZODONE HCL 50 MG PO TABS: 100.0000 mg | ORAL_TABLET | Freq: Every day | ORAL | Status: DC

## 2011-08-11 ENCOUNTER — Ambulatory Visit (INDEPENDENT_AMBULATORY_CARE_PROVIDER_SITE_OTHER): Payer: Medicare Other | Admitting: Cardiology

## 2011-08-11 ENCOUNTER — Encounter: Payer: Self-pay | Admitting: Cardiology

## 2011-08-11 ENCOUNTER — Telehealth: Payer: Self-pay | Admitting: Physician Assistant

## 2011-08-11 VITALS — BP 140/68 | HR 76 | Ht 62.0 in | Wt 152.0 lb

## 2011-08-11 DIAGNOSIS — I4891 Unspecified atrial fibrillation: Secondary | ICD-10-CM | POA: Diagnosis not present

## 2011-08-11 MED ORDER — WARFARIN SODIUM 5 MG PO TABS: 5.0000 mg | ORAL_TABLET | Freq: Every day | ORAL | Status: DC

## 2011-08-11 NOTE — Assessment & Plan Note (Signed)
Continue statin. 

## 2011-08-11 NOTE — Assessment & Plan Note (Signed)
Discontinue aspirin given need for Coumadin. Continue statin.

## 2011-08-11 NOTE — Patient Instructions (Signed)
Your physician recommends that you schedule a follow-up appointment in: 3 MONTHS  STOP ASPIRIN  START WARFARIN SODIUM 5 MG TAKE 1/2 TABLET DAILY  PROTIME CHECK IN THE COUMADIN CLINIC Monday 08-16-11

## 2011-08-11 NOTE — Telephone Encounter (Signed)
ROI was faxed to [place] @ [phone] 07/09/11 Records were received today gave to [facility] 08/11/11/Km

## 2011-08-11 NOTE — Assessment & Plan Note (Signed)
Plan repeat carotid Dopplers in August 2013.

## 2011-08-11 NOTE — Assessment & Plan Note (Signed)
Blood pressure controlled. Continue present medications. 

## 2011-08-11 NOTE — Assessment & Plan Note (Signed)
Repeat echocardiogram in 3 months.

## 2011-08-11 NOTE — Progress Notes (Signed)
[name] is a 66 y.o. female who presents for fu of pericardial effusion and atrial fibrillation. She has a history of coronary artery disease, status post PCI of the right coronary artery. Last LHC 5/05: Mid RCA 40%, EF 60%. Her last Myoview 3/12: Anterior defect consistent with breast attenuation, EF 55% The patient apparently was admitted in December of 123XX123 with an embolic CVA. An echocardiogram showed normal LV function and a small pericardial effusion. She subsequently had a TEE which revealed normal LV function and a moderate effusion. There were no vegetations. She also apparently was placed on Coumadin with a diagnosis of paroxysmal atrial fibrillation. Carotid Dopplers performed in August 2011 showed 40-59% left stenosis and 0-39% right; f/u recommended in 2 years.  An echocardiogram at [facility] prior to renal transplant apparently demonstrated severe AS. TEE was done to follow up in 3/12: EF 60-65%, moderate AS, mild-moderate LAE, small pericardial effusion, mean AV gradient 17 mm of mercury. 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 Repeat echocardiogram in December of 2012 showed normal LV function, mild aortic stenosis, moderate left atrial enlargement and a moderate pericardial effusion unchanged from previous. She was seen by Richardson Dopp at that time for generalized weakness. Blood pressure medicines decreased. Hgb 07/26/11 - 10. Since she was last seen, she feels much better. She denies dyspnea or chest pain, palpitations, syncope. Her energy is improving.   Current Outpatient Prescriptions  Medication Sig Dispense Refill  . aspirin 81 MG tablet Take 81 mg by mouth daily.        . cinacalcet (SENSIPAR) 60 MG tablet Take 60 mg by mouth every other day.      . dapsone 25 MG tablet Take 25 mg by mouth daily.        . diphenhydrAMINE (BENADRYL) 50 MG capsule Take 50 mg by mouth every 6 (six) hours as needed.        Marland Kitchen esomeprazole (NEXIUM) 40 MG capsule Take 40 mg by mouth daily  before breakfast.        . glipiZIDE (GLUCOTROL XL) 10 MG 24 hr tablet Take 10 mg by mouth daily.        Marland Kitchen labetalol (NORMODYNE) 100 MG tablet Take 100 mg by mouth 2 (two) times daily.        . mycophenolate (MYFORTIC) 180 MG EC tablet Take 360 mg by mouth 2 (two) times daily.        Marland Kitchen NIFEdipine (PROCARDIA XL/ADALAT-CC) 30 MG 24 hr tablet Take 30 mg by mouth daily.        . polyethylene glycol (MIRALAX / GLYCOLAX) packet Take 17 g by mouth 2 (two) times daily. As needed       . pramoxine-hydrocortisone 1-1 % lotion Apply topically 2 (two) times daily as needed.        . pravastatin (PRAVACHOL) 40 MG tablet Take 40 mg by mouth. Take one tablet Mon, Wed, and Friday       . sennosides-docusate sodium (SENOKOT-S) 8.6-50 MG tablet Take 2 tablets by mouth daily.        . sitaGLIPtin (JANUVIA) 100 MG tablet Take 100 mg by mouth daily.        . tacrolimus (PROGRAF) 1 MG capsule Take 1 mg by mouth 2 (two) times daily. Take 6 capsules by mouth 2 times daily       . traZODone (DESYREL) 50 MG tablet Take 50 mg by mouth 3 times/day as needed-between meals & bedtime.       Marland Kitchen  traMADol (ULTRAM) 50 MG tablet Take 1 tablet (50 mg total) by mouth 2 (two) times daily as needed for pain.  100 tablet  3     Past Medical History  Diagnosis Date  . Coronary artery disease 05/2002    s/p acute inf, MI  . Paroxysmal a-fib   . Myocardial infarction     hx of  . Hypertension   . Hyperlipidemia   . CHF (congestive heart failure)   . Renal failure     end stage renal disease;  M,W,F DIALYSIS  . Pericardial effusion   . 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 GERD (gastroesophageal reflux disease)   . Malignant neoplasm of kidney   . 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 Unspecified hearing loss   . Diabetes mellitus     type 2  . Anemia, iron deficiency     of chronic disease  . Helicobacter pylori (H. pylori) infection     hx of  .  Esophagitis, reflux   . Gout   . Sleep apnea   . Cholelithiasis   . Hx of colonic polyps   . Diverticulosis of colon   . Streptococcal infection group D enterococcus   . Chronic cough onset 03/2010    Dr Melvyn Novas    Past Surgical History  Procedure Date  . Pci of rt coronary artery   . Cholecystectomy 2009  . Left av graft     removal of  . Right av fistula   . Nasal septal     surgery  . Tonsillectomy   . Abdominal hysterectomy   . Oophorectomy   . Tubal ligation   . Nephrectomy 2010    no CA on bx  . Kidney transplant 03/16/11    History   Social History  . Marital Status: Married    Spouse Name: N/A    Number of Children: N/A  . Years of Education: N/A   Occupational History  . retired    Social History Main Topics  . Smoking status: Former Smoker -- 1.0 packs/day for 30 years    Types: Cigarettes    Quit date: 07/19/2001  . Smokeless tobacco: Not on file  . Alcohol Use: No  . Drug Use: No  . Sexually Active: Not Currently   Other Topics Concern  . Not on file   Social History Narrative   Patient signed a Designated Party Release to allow her spouse [name] and family and five children to have access to her medical records/information.      ROS: no fevers or chills, productive cough, hemoptysis, dysphasia, odynophagia, melena, hematochezia, dysuria, hematuria, rash, seizure activity, orthopnea, PND, pedal edema, claudication. Remaining systems are negative.  Physical Exam: Well-developed well-nourished in no acute distress.  Skin is warm and dry.  HEENT is normal.  Neck is supple. No thyromegaly.  Chest is clear to auscultation with normal expansion.  Cardiovascular exam is regular rate and rhythm. 3/6 systolic murmur left sternal border Abdominal exam nontender or distended. No masses palpated. Extremities show no edema. Fistula in left upper extremity neuro grossly intact

## 2011-08-11 NOTE — Assessment & Plan Note (Signed)
Patient in sinus rhythm on examination. Given history of atrial fibrillation and previous CVA I would like her on Coumadin long-term. Her pericardial effusion is chronic and unchanged. We will therefore discontinue aspirin. Begin Coumadin 2.5 mg daily. Check INR in Coumadin clinic on January 28. Goal INR 2-3. She will be followed in Coumadin clinic thereafter.

## 2011-08-11 NOTE — Assessment & Plan Note (Signed)
She will need followup echocardiograms in the future. 

## 2011-08-17 ENCOUNTER — Ambulatory Visit (INDEPENDENT_AMBULATORY_CARE_PROVIDER_SITE_OTHER): Payer: Medicare Other | Admitting: *Deleted

## 2011-08-17 DIAGNOSIS — I4891 Unspecified atrial fibrillation: Secondary | ICD-10-CM | POA: Diagnosis not present

## 2011-08-17 DIAGNOSIS — Z7901 Long term (current) use of anticoagulants: Secondary | ICD-10-CM | POA: Diagnosis not present

## 2011-08-17 LAB — POCT INR: INR: 1.1

## 2011-08-17 NOTE — Patient Instructions (Signed)

## 2011-08-24 ENCOUNTER — Ambulatory Visit (INDEPENDENT_AMBULATORY_CARE_PROVIDER_SITE_OTHER): Payer: Medicare Other | Admitting: *Deleted

## 2011-08-24 DIAGNOSIS — I4891 Unspecified atrial fibrillation: Secondary | ICD-10-CM

## 2011-08-24 DIAGNOSIS — Z7901 Long term (current) use of anticoagulants: Secondary | ICD-10-CM

## 2011-08-24 LAB — POCT INR: INR: 1.1

## 2011-08-26 ENCOUNTER — Encounter: Payer: Self-pay | Admitting: Cardiology

## 2011-08-26 DIAGNOSIS — D649 Anemia, unspecified: Secondary | ICD-10-CM | POA: Diagnosis not present

## 2011-08-26 DIAGNOSIS — E119 Type 2 diabetes mellitus without complications: Secondary | ICD-10-CM | POA: Diagnosis not present

## 2011-08-26 DIAGNOSIS — N2581 Secondary hyperparathyroidism of renal origin: Secondary | ICD-10-CM | POA: Diagnosis not present

## 2011-08-26 DIAGNOSIS — Z79899 Other long term (current) drug therapy: Secondary | ICD-10-CM | POA: Diagnosis not present

## 2011-08-26 DIAGNOSIS — Z940 Kidney transplant status: Secondary | ICD-10-CM | POA: Diagnosis not present

## 2011-08-26 DIAGNOSIS — I10 Essential (primary) hypertension: Secondary | ICD-10-CM | POA: Diagnosis not present

## 2011-08-31 ENCOUNTER — Ambulatory Visit (INDEPENDENT_AMBULATORY_CARE_PROVIDER_SITE_OTHER): Payer: Medicare Other | Admitting: Internal Medicine

## 2011-08-31 ENCOUNTER — Ambulatory Visit (INDEPENDENT_AMBULATORY_CARE_PROVIDER_SITE_OTHER): Payer: Medicare Other

## 2011-08-31 ENCOUNTER — Encounter: Payer: Self-pay | Admitting: Internal Medicine

## 2011-08-31 DIAGNOSIS — L659 Nonscarring hair loss, unspecified: Secondary | ICD-10-CM | POA: Diagnosis not present

## 2011-08-31 DIAGNOSIS — E119 Type 2 diabetes mellitus without complications: Secondary | ICD-10-CM | POA: Diagnosis not present

## 2011-08-31 DIAGNOSIS — L299 Pruritus, unspecified: Secondary | ICD-10-CM

## 2011-08-31 DIAGNOSIS — Z7901 Long term (current) use of anticoagulants: Secondary | ICD-10-CM

## 2011-08-31 DIAGNOSIS — I6350 Cerebral infarction due to unspecified occlusion or stenosis of unspecified cerebral artery: Secondary | ICD-10-CM | POA: Diagnosis not present

## 2011-08-31 DIAGNOSIS — I4891 Unspecified atrial fibrillation: Secondary | ICD-10-CM

## 2011-08-31 DIAGNOSIS — N19 Unspecified kidney failure: Secondary | ICD-10-CM

## 2011-08-31 DIAGNOSIS — R21 Rash and other nonspecific skin eruption: Secondary | ICD-10-CM

## 2011-08-31 LAB — POCT INR: INR: 1.7

## 2011-08-31 MED ORDER — DIPHENHYDRAMINE HCL 50 MG PO CAPS: 50.0000 mg | ORAL_CAPSULE | Freq: Four times a day (QID) | ORAL | Status: DC | PRN

## 2011-08-31 NOTE — Assessment & Plan Note (Signed)
Continue with current prescription therapy as reflected on the Med list.  

## 2011-08-31 NOTE — Progress Notes (Signed)
[name], female   DOB: 1945/11/30, 66 y.o.   MRN: [id number]  Subjective:    [name], female    DOB: 1946-01-06, 66 y.o.   MRN: [id number]  HPI  The patient presents for a follow-up of  Chronic ESRD, hypertension, chronic dyslipidemia, type 2 diabetes controlled with medicines. She just had a renal tranplant in 8/12. C/o itching and rash - not better. Her LE weakness is better...    Review of Systems  Constitutional: Positive for appetite change and fatigue. Negative for chills, activity change and unexpected weight change.  HENT: Negative for congestion, mouth sores and sinus pressure.   Eyes: Negative for visual disturbance.  Respiratory: Negative for cough and chest tightness.   Gastrointestinal: Negative for nausea and abdominal pain.  Genitourinary: Negative for frequency, difficulty urinating and vaginal pain.  Musculoskeletal: Positive for arthralgias and gait problem. Negative for back pain.  Skin: Negative for pallor and rash.       pruritis  Neurological: Negative for dizziness, tremors, weakness, numbness and headaches.  Psychiatric/Behavioral: Negative for confusion and sleep disturbance. The patient is nervous/anxious.        Objective:   Physical Exam  Constitutional: No distress.       Chronically ill appearing -- in a w/c  HENT:  Head: Normocephalic.  Right Ear: External ear normal.  Left Ear: External ear normal.  Nose: Nose normal.  Mouth/Throat: Oropharynx is clear and moist.  Eyes: Conjunctivae are normal. Pupils are equal, round, and reactive to light. Right eye exhibits no discharge. Left eye exhibits no discharge.  Neck: Normal range of motion. Neck supple. No JVD present. No tracheal deviation present. No thyromegaly present.  Cardiovascular: Normal rate and regular rhythm.  Exam reveals no gallop and no friction rub.   Murmur (2/6) heard. Pulmonary/Chest: No stridor. No respiratory distress. She has no wheezes.    Abdominal: Soft. Bowel sounds are normal. She exhibits no distension and no mass. There is no tenderness. There is no rebound and no guarding.  Musculoskeletal: She exhibits no edema and no tenderness.  Lymphadenopathy:    She has no cervical adenopathy.  Neurological: She displays normal reflexes. No cranial nerve deficit. She exhibits normal muscle tone. Coordination normal.  Skin: Rash (hives and hyperpigmentation on LE and UEs B) noted. No erythema.  Psychiatric: She has a normal mood and affect. Her behavior is normal. Thought content normal.       Sad a little    Graft in L arm seems to be ok      Assessment & Plan:   Labs at the [facility] clinic

## 2011-08-31 NOTE — Assessment & Plan Note (Signed)
ESRD on HD On HD till 8/12 Transplant 8/12

## 2011-08-31 NOTE — Assessment & Plan Note (Signed)
Continue with current prescription therapy as reflected on the Med list. No recurrence

## 2011-08-31 NOTE — Assessment & Plan Note (Signed)
better 

## 2011-08-31 NOTE — Assessment & Plan Note (Signed)
Continue with current prescription therapy as reflected on the Med list. Not better

## 2011-09-07 ENCOUNTER — Ambulatory Visit (INDEPENDENT_AMBULATORY_CARE_PROVIDER_SITE_OTHER): Payer: Medicare Other | Admitting: Pharmacist

## 2011-09-07 DIAGNOSIS — I4891 Unspecified atrial fibrillation: Secondary | ICD-10-CM | POA: Diagnosis not present

## 2011-09-07 DIAGNOSIS — Z7901 Long term (current) use of anticoagulants: Secondary | ICD-10-CM

## 2011-09-20 ENCOUNTER — Encounter: Payer: Self-pay | Admitting: Cardiology

## 2011-09-20 DIAGNOSIS — Z940 Kidney transplant status: Secondary | ICD-10-CM | POA: Diagnosis not present

## 2011-09-20 DIAGNOSIS — I10 Essential (primary) hypertension: Secondary | ICD-10-CM | POA: Diagnosis not present

## 2011-09-20 DIAGNOSIS — Z79899 Other long term (current) drug therapy: Secondary | ICD-10-CM | POA: Diagnosis not present

## 2011-09-20 DIAGNOSIS — E119 Type 2 diabetes mellitus without complications: Secondary | ICD-10-CM | POA: Diagnosis not present

## 2011-09-20 DIAGNOSIS — D649 Anemia, unspecified: Secondary | ICD-10-CM | POA: Diagnosis not present

## 2011-09-20 DIAGNOSIS — N2581 Secondary hyperparathyroidism of renal origin: Secondary | ICD-10-CM | POA: Diagnosis not present

## 2011-09-21 ENCOUNTER — Ambulatory Visit (INDEPENDENT_AMBULATORY_CARE_PROVIDER_SITE_OTHER): Payer: Medicare Other | Admitting: *Deleted

## 2011-09-21 DIAGNOSIS — Z7901 Long term (current) use of anticoagulants: Secondary | ICD-10-CM | POA: Diagnosis not present

## 2011-09-21 DIAGNOSIS — I4891 Unspecified atrial fibrillation: Secondary | ICD-10-CM

## 2011-09-22 DIAGNOSIS — N2581 Secondary hyperparathyroidism of renal origin: Secondary | ICD-10-CM | POA: Diagnosis not present

## 2011-09-22 DIAGNOSIS — Z940 Kidney transplant status: Secondary | ICD-10-CM | POA: Diagnosis not present

## 2011-09-22 DIAGNOSIS — E119 Type 2 diabetes mellitus without complications: Secondary | ICD-10-CM | POA: Diagnosis not present

## 2011-10-01 ENCOUNTER — Other Ambulatory Visit: Payer: Self-pay | Admitting: Physician Assistant

## 2011-10-04 ENCOUNTER — Other Ambulatory Visit: Payer: Self-pay | Admitting: Internal Medicine

## 2011-10-04 ENCOUNTER — Telehealth: Payer: Self-pay | Admitting: *Deleted

## 2011-10-04 NOTE — Telephone Encounter (Signed)
Error

## 2011-10-05 ENCOUNTER — Ambulatory Visit (INDEPENDENT_AMBULATORY_CARE_PROVIDER_SITE_OTHER): Payer: Medicare Other

## 2011-10-05 DIAGNOSIS — Z7901 Long term (current) use of anticoagulants: Secondary | ICD-10-CM | POA: Diagnosis not present

## 2011-10-05 DIAGNOSIS — I4891 Unspecified atrial fibrillation: Secondary | ICD-10-CM | POA: Diagnosis not present

## 2011-10-19 ENCOUNTER — Ambulatory Visit (INDEPENDENT_AMBULATORY_CARE_PROVIDER_SITE_OTHER): Payer: Medicare Other

## 2011-10-19 DIAGNOSIS — Z7901 Long term (current) use of anticoagulants: Secondary | ICD-10-CM | POA: Diagnosis not present

## 2011-10-19 DIAGNOSIS — I4891 Unspecified atrial fibrillation: Secondary | ICD-10-CM | POA: Diagnosis not present

## 2011-10-19 MED ORDER — WARFARIN SODIUM 5 MG PO TABS: ORAL_TABLET | ORAL | Status: DC

## 2011-10-28 DIAGNOSIS — R569 Unspecified convulsions: Secondary | ICD-10-CM | POA: Diagnosis not present

## 2011-11-05 ENCOUNTER — Encounter: Payer: Self-pay | Admitting: Cardiology

## 2011-11-05 DIAGNOSIS — N2581 Secondary hyperparathyroidism of renal origin: Secondary | ICD-10-CM | POA: Diagnosis not present

## 2011-11-05 DIAGNOSIS — Z79899 Other long term (current) drug therapy: Secondary | ICD-10-CM | POA: Diagnosis not present

## 2011-11-05 DIAGNOSIS — E119 Type 2 diabetes mellitus without complications: Secondary | ICD-10-CM | POA: Diagnosis not present

## 2011-11-05 DIAGNOSIS — I10 Essential (primary) hypertension: Secondary | ICD-10-CM | POA: Diagnosis not present

## 2011-11-05 DIAGNOSIS — D649 Anemia, unspecified: Secondary | ICD-10-CM | POA: Diagnosis not present

## 2011-11-05 DIAGNOSIS — Z940 Kidney transplant status: Secondary | ICD-10-CM | POA: Diagnosis not present

## 2011-11-07 ENCOUNTER — Other Ambulatory Visit: Payer: Self-pay | Admitting: Physician Assistant

## 2011-11-09 ENCOUNTER — Ambulatory Visit (INDEPENDENT_AMBULATORY_CARE_PROVIDER_SITE_OTHER): Payer: Medicare Other | Admitting: Pharmacist

## 2011-11-09 DIAGNOSIS — I4891 Unspecified atrial fibrillation: Secondary | ICD-10-CM | POA: Diagnosis not present

## 2011-11-09 DIAGNOSIS — Z7901 Long term (current) use of anticoagulants: Secondary | ICD-10-CM | POA: Diagnosis not present

## 2011-11-09 LAB — POCT INR: INR: 2.9

## 2011-11-10 ENCOUNTER — Encounter: Payer: Self-pay | Admitting: Cardiology

## 2011-11-10 ENCOUNTER — Ambulatory Visit (INDEPENDENT_AMBULATORY_CARE_PROVIDER_SITE_OTHER): Payer: Medicare Other | Admitting: Cardiology

## 2011-11-10 VITALS — BP 135/73 | HR 75 | Ht 62.0 in | Wt 166.0 lb

## 2011-11-10 DIAGNOSIS — I359 Nonrheumatic aortic valve disorder, unspecified: Secondary | ICD-10-CM

## 2011-11-10 DIAGNOSIS — I4891 Unspecified atrial fibrillation: Secondary | ICD-10-CM | POA: Diagnosis not present

## 2011-11-10 DIAGNOSIS — E7800 Pure hypercholesterolemia, unspecified: Secondary | ICD-10-CM

## 2011-11-10 DIAGNOSIS — I10 Essential (primary) hypertension: Secondary | ICD-10-CM

## 2011-11-10 DIAGNOSIS — I350 Nonrheumatic aortic (valve) stenosis: Secondary | ICD-10-CM

## 2011-11-10 DIAGNOSIS — I6529 Occlusion and stenosis of unspecified carotid artery: Secondary | ICD-10-CM

## 2011-11-10 DIAGNOSIS — I319 Disease of pericardium, unspecified: Secondary | ICD-10-CM

## 2011-11-10 DIAGNOSIS — E785 Hyperlipidemia, unspecified: Secondary | ICD-10-CM

## 2011-11-10 DIAGNOSIS — I2510 Atherosclerotic heart disease of native coronary artery without angina pectoris: Secondary | ICD-10-CM

## 2011-11-10 NOTE — Assessment & Plan Note (Signed)
Continue statin. Check lipids

## 2011-11-10 NOTE — Assessment & Plan Note (Signed)
Continue statin. Followup carotid Dopplers August 2013.

## 2011-11-10 NOTE — Patient Instructions (Signed)
Your physician wants you to follow-up in: 6 MONTHS You will receive a reminder letter in the mail two months in advance. If you don't receive a letter, please call our office to schedule the follow-up appointment.   Your physician has requested that you have an echocardiogram. Echocardiography is a painless test that uses sound waves to create images of your heart. It provides your doctor with information about the size and shape of your heart and how well your heart's chambers and valves are working. This procedure takes approximately one hour. There are no restrictions for this procedure.   Your physician has requested that you have a carotid duplex. This test is an ultrasound of the carotid arteries in your neck. It looks at blood flow through these arteries that supply the brain with blood. Allow one hour for this exam. There are no restrictions or special instructions.SCHEDULE IN [place]  Your physician recommends that you return for a FASTING lipid profile: WHEN ABLE

## 2011-11-10 NOTE — Assessment & Plan Note (Signed)
Continue statin. Not on aspirin as she requires Coumadin for atrial fibrillation.

## 2011-11-10 NOTE — Assessment & Plan Note (Signed)
Patient will need followup echocardiograms in the future.

## 2011-11-10 NOTE — Assessment & Plan Note (Signed)
Plan repeat echocardiogram. 

## 2011-11-10 NOTE — Assessment & Plan Note (Signed)
Blood pressure controlled. Continue present medications. 

## 2011-11-10 NOTE — Assessment & Plan Note (Signed)
Continue beta blocker and Coumadin. 

## 2011-11-10 NOTE — Progress Notes (Signed)
HPI: Pleasant female who presents for fu of pericardial effusion and atrial fibrillation. She has a history of coronary artery disease, status post PCI of the right coronary artery. Last LHC 5/05: Mid RCA 40%, EF 60%. 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 An echocardiogram showed normal LV function and a small pericardial effusion. She subsequently had a TEE which revealed normal LV function and a moderate effusion. There were no vegetations. She was placed on Coumadin with a diagnosis of paroxysmal atrial fibrillation. Carotid Dopplers performed in August 2011 showed 40-59% left stenosis and 0-39% right; f/u recommended in 2 years. An echocardiogram at [facility] prior to renal transplant apparently demonstrated severe AS. TEE was done to follow up in 3/12: EF 60-65%, moderate AS, mild-moderate LAE, small pericardial effusion, mean AV gradient 17 mm of mercury. 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 Repeat echocardiogram in December of 2012 showed normal LV function, mild aortic stenosis, moderate left atrial enlargement and a moderate pericardial effusion unchanged from previous. She was seen by Richardson Dopp at that time for generalized weakness. Blood pressure medicines decreased. Hgb 07/26/11 - 10. I last saw her in Jan 2013. Since then, she denies dyspnea on exertion, orthopnea, PND, pedal edema, palpitations, syncope or chest pain.   Current Outpatient Prescriptions  Medication Sig Dispense Refill  . cinacalcet (SENSIPAR) 60 MG tablet Take 60 mg by mouth every other day.      . dapsone 25 MG tablet Take 25 mg by mouth daily.        . diphenhydrAMINE (BENADRYL) 50 MG capsule Take 1 capsule (50 mg total) by mouth every 6 (six) hours as needed for itching.  90 capsule  3  . esomeprazole (NEXIUM) 40 MG capsule Take 40 mg by mouth daily before breakfast.        . glipiZIDE (GLUCOTROL XL) 10  MG 24 hr tablet TAKE 1 TABLET BY MOUTH TWICE A DAY  60 tablet  5  . labetalol (NORMODYNE) 100 MG tablet Take 100 mg by mouth 2 (two) times daily.        Marland Kitchen labetalol (NORMODYNE) 100 MG tablet TAKE 1 TABLET BY MOUTH TWICE A DAY  60 tablet  3  . mycophenolate (MYFORTIC) 180 MG EC tablet Take 360 mg by mouth 2 (two) times daily.        Marland Kitchen NIFEdipine (PROCARDIA XL/ADALAT-CC) 30 MG 24 hr tablet Take 30 mg by mouth daily.        Marland Kitchen NIFEdipine (PROCARDIA XL/ADALAT-CC) 30 MG 24 hr tablet TAKE 1 TABLET BY MOUTH EVERY DAY  30 tablet  3  . polyethylene glycol (MIRALAX / GLYCOLAX) packet Take 17 g by mouth 2 (two) times daily. As needed       . pramoxine-hydrocortisone 1-1 % lotion Apply topically 2 (two) times daily as needed.        . sennosides-docusate sodium (SENOKOT-S) 8.6-50 MG tablet Take 2 tablets by mouth daily.        . sitaGLIPtin (JANUVIA) 100 MG tablet Take 100 mg by mouth daily.        . tacrolimus (PROGRAF) 1 MG capsule Take 1 mg by mouth 2 (two) times daily. Take 6 capsules by mouth 2 times daily       . traZODone (DESYREL) 50 MG tablet Take 50 mg by mouth 3 times/day as needed-between meals & bedtime.       Marland Kitchen warfarin (COUMADIN)  5 MG tablet Take as directed by anticoagulation clinic  50 tablet  3  . pravastatin (PRAVACHOL) 40 MG tablet Take 40 mg by mouth. Take one tablet Mon, Wed, and Friday       . traMADol (ULTRAM) 50 MG tablet Take 50 mg by mouth 2 (two) times daily as needed.         Past Medical History  Diagnosis Date  . Coronary artery disease 05/2002    s/p acute inf, MI  . Paroxysmal a-fib   . Myocardial infarction     hx of  . Hypertension   . Hyperlipidemia   . CHF (congestive heart failure)   . Renal failure     end stage renal disease;  M,W,F DIALYSIS  . Pericardial effusion   . 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 GERD (gastroesophageal reflux disease)   . Malignant neoplasm of kidney   . 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 Unspecified hearing loss   . Diabetes mellitus     type 2  . Anemia, iron deficiency     of chronic disease  . Helicobacter pylori (H. pylori) infection     hx of  . Esophagitis, reflux   . Gout   . Sleep apnea   . Cholelithiasis   . Hx of colonic polyps   . Diverticulosis of colon   . Streptococcal infection group D enterococcus   . Chronic cough onset 03/2010    Dr Melvyn Novas    Past Surgical History  Procedure Date  . Pci of rt coronary artery   . Cholecystectomy 2009  . Left av graft     removal of  . Right av fistula   . Nasal septal     surgery  . Tonsillectomy   . Abdominal hysterectomy   . Oophorectomy   . Tubal ligation   . Nephrectomy 2010    no CA on bx  . Kidney transplant 03/16/11    History   Social History  . Marital Status: Married    Spouse Name: N/A    Number of Children: N/A  . Years of Education: N/A   Occupational History  . retired    Social History Main Topics  . Smoking status: Former Smoker -- 1.0 packs/day for 30 years    Types: Cigarettes    Quit date: 07/19/2001  . Smokeless tobacco: Not on file  . Alcohol Use: No  . Drug Use: No  . Sexually Active: Not Currently   Other Topics Concern  . Not on file   Social History Narrative   Patient signed a Designated Party Release to allow her spouse [name] and family and five children to have access to her medical records/information.      ROS: Significant pruritus but no fevers or chills, productive cough, hemoptysis, dysphasia, odynophagia, melena, hematochezia, dysuria, hematuria, rash, seizure activity, orthopnea, PND, pedal edema, claudication. Remaining systems are negative.  Physical Exam: Well-developed well-nourished in no acute distress.  Skin is warm and dry. Mild rash over her upper back HEENT is normal.  Neck is supple.  Chest is clear to auscultation with normal expansion.  Cardiovascular exam is regular rate and rhythm. 3/6 systolic murmur  left sternal border. Abdominal exam nontender or distended. No masses palpated. Extremities show no edema. neuro grossly intact  ECG sinus rhythm at a rate of 75. Left ventricular hypertrophy. Lateral T wave inversion.

## 2011-11-16 DIAGNOSIS — R569 Unspecified convulsions: Secondary | ICD-10-CM | POA: Diagnosis not present

## 2011-11-22 DIAGNOSIS — Z940 Kidney transplant status: Secondary | ICD-10-CM | POA: Diagnosis not present

## 2011-11-22 DIAGNOSIS — N2581 Secondary hyperparathyroidism of renal origin: Secondary | ICD-10-CM | POA: Diagnosis not present

## 2011-11-22 DIAGNOSIS — I10 Essential (primary) hypertension: Secondary | ICD-10-CM | POA: Diagnosis not present

## 2011-11-22 DIAGNOSIS — E119 Type 2 diabetes mellitus without complications: Secondary | ICD-10-CM | POA: Diagnosis not present

## 2011-11-24 ENCOUNTER — Other Ambulatory Visit: Payer: Self-pay

## 2011-11-24 ENCOUNTER — Other Ambulatory Visit (INDEPENDENT_AMBULATORY_CARE_PROVIDER_SITE_OTHER): Payer: Medicare Other

## 2011-11-24 ENCOUNTER — Ambulatory Visit (HOSPITAL_COMMUNITY): Payer: Medicare Other | Attending: Cardiovascular Disease

## 2011-11-24 DIAGNOSIS — N189 Chronic kidney disease, unspecified: Secondary | ICD-10-CM | POA: Insufficient documentation

## 2011-11-24 DIAGNOSIS — I319 Disease of pericardium, unspecified: Secondary | ICD-10-CM | POA: Insufficient documentation

## 2011-11-24 DIAGNOSIS — I129 Hypertensive chronic kidney disease with stage 1 through stage 4 chronic kidney disease, or unspecified chronic kidney disease: Secondary | ICD-10-CM | POA: Diagnosis not present

## 2011-11-24 DIAGNOSIS — E669 Obesity, unspecified: Secondary | ICD-10-CM | POA: Insufficient documentation

## 2011-11-24 DIAGNOSIS — E119 Type 2 diabetes mellitus without complications: Secondary | ICD-10-CM | POA: Diagnosis not present

## 2011-11-24 DIAGNOSIS — I4891 Unspecified atrial fibrillation: Secondary | ICD-10-CM | POA: Diagnosis not present

## 2011-11-24 DIAGNOSIS — I359 Nonrheumatic aortic valve disorder, unspecified: Secondary | ICD-10-CM | POA: Diagnosis not present

## 2011-11-24 DIAGNOSIS — I2510 Atherosclerotic heart disease of native coronary artery without angina pectoris: Secondary | ICD-10-CM | POA: Diagnosis not present

## 2011-11-24 DIAGNOSIS — E7800 Pure hypercholesterolemia, unspecified: Secondary | ICD-10-CM

## 2011-11-24 DIAGNOSIS — I350 Nonrheumatic aortic (valve) stenosis: Secondary | ICD-10-CM

## 2011-11-24 DIAGNOSIS — E785 Hyperlipidemia, unspecified: Secondary | ICD-10-CM | POA: Diagnosis not present

## 2011-11-24 LAB — LIPID PANEL
HDL: 66.9 mg/dL (ref >39.00)
LDL Cholesterol: 81 mg/dL (ref 0–99)
Total CHOL/HDL Ratio: 3
VLDL: 30.6 mg/dL (ref 0.0–40.0)

## 2011-11-30 ENCOUNTER — Ambulatory Visit: Payer: Medicare Other | Admitting: Internal Medicine

## 2011-12-07 ENCOUNTER — Ambulatory Visit (INDEPENDENT_AMBULATORY_CARE_PROVIDER_SITE_OTHER): Payer: Medicare Other | Admitting: *Deleted

## 2011-12-07 DIAGNOSIS — Z7901 Long term (current) use of anticoagulants: Secondary | ICD-10-CM | POA: Diagnosis not present

## 2011-12-07 DIAGNOSIS — I4891 Unspecified atrial fibrillation: Secondary | ICD-10-CM

## 2011-12-07 LAB — POCT INR: INR: 2.4

## 2011-12-30 DIAGNOSIS — I10 Essential (primary) hypertension: Secondary | ICD-10-CM | POA: Diagnosis not present

## 2011-12-30 DIAGNOSIS — E119 Type 2 diabetes mellitus without complications: Secondary | ICD-10-CM | POA: Diagnosis not present

## 2011-12-30 DIAGNOSIS — Z940 Kidney transplant status: Secondary | ICD-10-CM | POA: Diagnosis not present

## 2011-12-30 DIAGNOSIS — D649 Anemia, unspecified: Secondary | ICD-10-CM | POA: Diagnosis not present

## 2011-12-30 DIAGNOSIS — E319 Polyglandular dysfunction, unspecified: Secondary | ICD-10-CM | POA: Diagnosis not present

## 2011-12-30 DIAGNOSIS — Z79899 Other long term (current) drug therapy: Secondary | ICD-10-CM | POA: Diagnosis not present

## 2011-12-30 DIAGNOSIS — N2581 Secondary hyperparathyroidism of renal origin: Secondary | ICD-10-CM | POA: Diagnosis not present

## 2012-01-18 ENCOUNTER — Ambulatory Visit (INDEPENDENT_AMBULATORY_CARE_PROVIDER_SITE_OTHER): Payer: Medicare Other | Admitting: *Deleted

## 2012-01-18 DIAGNOSIS — Z7901 Long term (current) use of anticoagulants: Secondary | ICD-10-CM

## 2012-01-18 DIAGNOSIS — I4891 Unspecified atrial fibrillation: Secondary | ICD-10-CM | POA: Diagnosis not present

## 2012-01-18 LAB — POCT INR: INR: 2.2

## 2012-01-26 ENCOUNTER — Encounter: Payer: Self-pay | Admitting: Internal Medicine

## 2012-01-26 ENCOUNTER — Ambulatory Visit (INDEPENDENT_AMBULATORY_CARE_PROVIDER_SITE_OTHER): Payer: Medicare Other | Admitting: Internal Medicine

## 2012-01-26 VITALS — BP 150/90 | HR 88 | Temp 98.3°F | Resp 16 | Wt 174.0 lb

## 2012-01-26 DIAGNOSIS — E119 Type 2 diabetes mellitus without complications: Secondary | ICD-10-CM | POA: Diagnosis not present

## 2012-01-26 DIAGNOSIS — L259 Unspecified contact dermatitis, unspecified cause: Secondary | ICD-10-CM

## 2012-01-26 DIAGNOSIS — M25569 Pain in unspecified knee: Secondary | ICD-10-CM | POA: Insufficient documentation

## 2012-01-26 DIAGNOSIS — M25539 Pain in unspecified wrist: Secondary | ICD-10-CM

## 2012-01-26 DIAGNOSIS — N186 End stage renal disease: Secondary | ICD-10-CM

## 2012-01-26 DIAGNOSIS — I2510 Atherosclerotic heart disease of native coronary artery without angina pectoris: Secondary | ICD-10-CM | POA: Diagnosis not present

## 2012-01-26 DIAGNOSIS — R21 Rash and other nonspecific skin eruption: Secondary | ICD-10-CM

## 2012-01-26 DIAGNOSIS — I509 Heart failure, unspecified: Secondary | ICD-10-CM | POA: Diagnosis not present

## 2012-01-26 DIAGNOSIS — L309 Dermatitis, unspecified: Secondary | ICD-10-CM

## 2012-01-26 DIAGNOSIS — L299 Pruritus, unspecified: Secondary | ICD-10-CM

## 2012-01-26 MED ORDER — HYDROXYZINE HCL 50 MG PO TABS: 50.0000 mg | ORAL_TABLET | Freq: Three times a day (TID) | ORAL | Status: AC | PRN

## 2012-01-26 MED ORDER — DESOXIMETASONE 0.25 % EX CREA: TOPICAL_CREAM | Freq: Two times a day (BID) | CUTANEOUS | Status: DC

## 2012-01-26 MED ORDER — HYDROXYZINE HCL 50 MG PO TABS: 50.0000 mg | ORAL_TABLET | Freq: Three times a day (TID) | ORAL | Status: DC | PRN

## 2012-01-26 MED ORDER — METHYLPREDNISOLONE ACETATE 80 MG/ML IJ SUSP
120.0000 mg | Freq: Once | INTRAMUSCULAR | Status: AC
  Administered 2012-01-26: 120 mg via INTRAMUSCULAR

## 2012-01-26 NOTE — Assessment & Plan Note (Signed)
Continue with current prescription therapy as reflected on the Med list.  

## 2012-01-26 NOTE — Assessment & Plan Note (Signed)
Resolved in 2012. Restarted in 9/12  ?prograf related Chronic - worse in 7/13   Hydroxyzine prn Cream: Triamc in Eucerin 1:5  --- use bid

## 2012-01-26 NOTE — Progress Notes (Signed)
Subjective:    [name], female    DOB: [birth year], 66 y.o.   MRN: [id number]  Rash This is a chronic problem. The current episode started more than 1 year ago. The problem has been gradually worsening since onset. The affected locations include the chest, right wrist, right arm, left wrist, right lower leg and right upper leg. She was exposed to nothing. Associated symptoms include fatigue. Pertinent negatives include no congestion or cough. Past treatments include anti-itch cream and topical steroids. The treatment provided mild relief. Her past medical history is significant for eczema.    The patient presents for a follow-up of  Chronic ESRD, hypertension, chronic dyslipidemia, type 2 diabetes controlled with medicines. She just had a renal tranplant in 8/12. C/o itching and rash - not better. Her LE weakness is better...  C/o L knee and B thumbs pain x 1-2 mo  BP Readings from Last 3 Encounters:  01/26/12 150/90  11/10/11 135/73  08/31/11 134/80   Wt Readings from Last 3 Encounters:  01/26/12 174 lb (78.926 kg)  11/10/11 166 lb (75.297 kg)  08/31/11 157 lb (71.215 kg)     Review of Systems  Constitutional: Positive for appetite change, fatigue and unexpected weight change (wt gain). Negative for chills and activity change.  HENT: Negative for congestion, mouth sores and sinus pressure.   Eyes: Negative for visual disturbance.  Respiratory: Negative for cough and chest tightness.   Gastrointestinal: Negative for nausea and abdominal pain.  Genitourinary: Negative for frequency, difficulty urinating and vaginal pain.  Musculoskeletal: Positive for arthralgias and gait problem. Negative for back pain.  Skin: Positive for rash. Negative for pallor.       pruritis  Neurological: Negative for dizziness, tremors, weakness, numbness and headaches.  Psychiatric/Behavioral: Negative for confusion and disturbed wake/sleep cycle. The patient is nervous/anxious.       Objective:   Physical Exam  Constitutional: No distress.       Chronically ill appearing -- in a w/c  HENT:  Head: Normocephalic.  Right Ear: External ear normal.  Left Ear: External ear normal.  Nose: Nose normal.  Mouth/Throat: Oropharynx is clear and moist.  Eyes: Conjunctivae are normal. Pupils are equal, round, and reactive to light. Right eye exhibits no discharge. Left eye exhibits no discharge.  Neck: Normal range of motion. Neck supple. No JVD present. No tracheal deviation present. No thyromegaly present.  Cardiovascular: Normal rate and regular rhythm.  Exam reveals no gallop and no friction rub.   Murmur (2/6) heard. Pulmonary/Chest: No stridor. No respiratory distress. She has no wheezes.  Abdominal: Soft. Bowel sounds are normal. She exhibits no distension and no mass. There is no tenderness. There is no rebound and no guarding.  Musculoskeletal: She exhibits no edema and no tenderness.  Lymphadenopathy:    She has no cervical adenopathy.  Neurological: She displays normal reflexes. No cranial nerve deficit. She exhibits normal muscle tone. Coordination normal.  Skin: Rash (hives and hyperpigmentation on LE and UEs B) noted. No erythema.       Diffuse nodular excoriations on back Eczema type rash on B shins  Psychiatric: She has a normal mood and affect. Her behavior is normal. Thought content normal.       Sad a little  R knee is tender B abd poll longus -- tender Graft in L arm seems to be ok  Lab Results  Component Value Date   WBC 6.0 10/02/2009   HGB 10.8* 10/02/2009   HCT  33.7* 10/02/2009   PLT 228.0 10/02/2009   GLUCOSE 106* 10/02/2009   CHOL 178 11/24/2011   TRIG 153.0* 11/24/2011   HDL 66.90 11/24/2011   LDLDIRECT 89.3 06/13/2006   LDLCALC 81 11/24/2011   ALT 21 10/02/2009   AST 25 10/02/2009   NA 143 10/02/2009   K 4.0 10/02/2009   CL 105 10/02/2009   CREATININE 5.1* 10/02/2009   BUN 32* 10/02/2009   CO2 31 10/02/2009   TSH 1.75 10/02/2009   INR 2.2 01/18/2012    HGBA1C 5.7 10/02/2009   MICROALBUR 168.6* 10/31/2006       Assessment & Plan:   Labs at the Nephrol clinic

## 2012-01-26 NOTE — Assessment & Plan Note (Signed)
On HD till 8/12 Transplant 8/12 

## 2012-01-27 ENCOUNTER — Encounter: Payer: Self-pay | Admitting: Internal Medicine

## 2012-02-04 ENCOUNTER — Ambulatory Visit: Payer: Medicare Other | Admitting: Internal Medicine

## 2012-02-15 DIAGNOSIS — N2581 Secondary hyperparathyroidism of renal origin: Secondary | ICD-10-CM | POA: Diagnosis not present

## 2012-02-15 DIAGNOSIS — Z79899 Other long term (current) drug therapy: Secondary | ICD-10-CM | POA: Diagnosis not present

## 2012-02-15 DIAGNOSIS — D649 Anemia, unspecified: Secondary | ICD-10-CM | POA: Diagnosis not present

## 2012-02-15 DIAGNOSIS — I10 Essential (primary) hypertension: Secondary | ICD-10-CM | POA: Diagnosis not present

## 2012-02-15 DIAGNOSIS — E119 Type 2 diabetes mellitus without complications: Secondary | ICD-10-CM | POA: Diagnosis not present

## 2012-02-15 DIAGNOSIS — Z940 Kidney transplant status: Secondary | ICD-10-CM | POA: Diagnosis not present

## 2012-02-21 DIAGNOSIS — I10 Essential (primary) hypertension: Secondary | ICD-10-CM | POA: Diagnosis not present

## 2012-02-21 DIAGNOSIS — N2581 Secondary hyperparathyroidism of renal origin: Secondary | ICD-10-CM | POA: Diagnosis not present

## 2012-02-21 DIAGNOSIS — Z940 Kidney transplant status: Secondary | ICD-10-CM | POA: Diagnosis not present

## 2012-02-25 ENCOUNTER — Telehealth: Payer: Self-pay | Admitting: *Deleted

## 2012-02-25 ENCOUNTER — Telehealth: Payer: Self-pay | Admitting: Cardiology

## 2012-02-25 NOTE — Telephone Encounter (Signed)
Spoke with pt, questions regarding procardia and labetalol answered.

## 2012-02-25 NOTE — Telephone Encounter (Signed)
Spoke with pt, we had received a office note from dr fox and the pt has been having low bp. The pt had stopped the labetalol. Per dr [name] pt instructed to stop the procardia and to restart the labetalol. Pt voiced understanding and she will track her bp and bring those numbers to her office visit with dr [name].

## 2012-02-25 NOTE — Telephone Encounter (Signed)
Pt calling re question on med, pls call

## 2012-02-29 ENCOUNTER — Ambulatory Visit (INDEPENDENT_AMBULATORY_CARE_PROVIDER_SITE_OTHER): Payer: Medicare Other | Admitting: Pharmacist

## 2012-02-29 ENCOUNTER — Ambulatory Visit (INDEPENDENT_AMBULATORY_CARE_PROVIDER_SITE_OTHER): Payer: Medicare Other | Admitting: Internal Medicine

## 2012-02-29 ENCOUNTER — Encounter: Payer: Self-pay | Admitting: Internal Medicine

## 2012-02-29 VITALS — BP 132/72 | HR 69 | Temp 98.2°F | Wt 175.0 lb

## 2012-02-29 DIAGNOSIS — I2510 Atherosclerotic heart disease of native coronary artery without angina pectoris: Secondary | ICD-10-CM

## 2012-02-29 DIAGNOSIS — E119 Type 2 diabetes mellitus without complications: Secondary | ICD-10-CM | POA: Diagnosis not present

## 2012-02-29 DIAGNOSIS — M109 Gout, unspecified: Secondary | ICD-10-CM | POA: Diagnosis not present

## 2012-02-29 DIAGNOSIS — R252 Cramp and spasm: Secondary | ICD-10-CM

## 2012-02-29 DIAGNOSIS — I4891 Unspecified atrial fibrillation: Secondary | ICD-10-CM | POA: Diagnosis not present

## 2012-02-29 DIAGNOSIS — E785 Hyperlipidemia, unspecified: Secondary | ICD-10-CM | POA: Diagnosis not present

## 2012-02-29 DIAGNOSIS — L299 Pruritus, unspecified: Secondary | ICD-10-CM

## 2012-02-29 DIAGNOSIS — Z7901 Long term (current) use of anticoagulants: Secondary | ICD-10-CM | POA: Diagnosis not present

## 2012-02-29 DIAGNOSIS — N19 Unspecified kidney failure: Secondary | ICD-10-CM

## 2012-02-29 LAB — POCT INR: INR: 4.7

## 2012-02-29 NOTE — Assessment & Plan Note (Signed)
Continue with current prescription therapy as reflected on the Med list.  

## 2012-02-29 NOTE — Patient Instructions (Signed)
Amish cramp medicine Tylenol pm

## 2012-02-29 NOTE — Assessment & Plan Note (Signed)
Doing well now 

## 2012-02-29 NOTE — Assessment & Plan Note (Signed)
Better  

## 2012-02-29 NOTE — Progress Notes (Signed)
[name], female   DOB: March 03, 1946, 66 y.o.   MRN: [id number]  Subjective:    [name], female    DOB: [birth year], 66 y.o.   MRN: [id number]  Diabetes Hypoglycemia symptoms include nervousness/anxiousness. Pertinent negatives for hypoglycemia include no confusion, dizziness, headaches, pallor or tremors. Associated symptoms include fatigue. Pertinent negatives for diabetes include no weakness.  Rash This is a chronic problem. The current episode started more than 1 year ago. The problem has been gradually worsening since onset. The affected locations include the chest, right wrist, right arm, left wrist, right lower leg and right upper leg. She was exposed to nothing. Associated symptoms include fatigue. Pertinent negatives include no congestion or cough. Past treatments include anti-itch cream and topical steroids. The treatment provided mild relief. Her past medical history is significant for eczema.    The patient presents for a follow-up of  Chronic ESRD, hypertension, chronic dyslipidemia, type 2 diabetes controlled with medicines. She just had a renal tranplant in 8/12. C/o itching and rash - not better. Her LE weakness is better...  C/o L knee and B thumbs pain x 1-2 mo. C/o cramps  BP Readings from Last 3 Encounters:  02/29/12 132/72  01/26/12 150/90  11/10/11 135/73   Wt Readings from Last 3 Encounters:  02/29/12 175 lb (79.379 kg)  01/26/12 174 lb (78.926 kg)  11/10/11 166 lb (75.297 kg)     Review of Systems  Constitutional: Positive for appetite change, fatigue and unexpected weight change (wt gain). Negative for chills and activity change.  HENT: Negative for congestion, mouth sores and sinus pressure.   Eyes: Negative for visual disturbance.  Respiratory: Negative for cough and chest tightness.   Gastrointestinal: Negative for nausea and abdominal pain.  Genitourinary: Negative for frequency, difficulty urinating and vaginal pain.    Musculoskeletal: Positive for arthralgias and gait problem. Negative for back pain.  Skin: Positive for rash. Negative for pallor.       pruritis  Neurological: Negative for dizziness, tremors, weakness, numbness and headaches.  Psychiatric/Behavioral: Negative for confusion and disturbed wake/sleep cycle. The patient is nervous/anxious.        Objective:   Physical Exam  Constitutional: No distress.       Chronically ill appearing -- in a w/c  HENT:  Head: Normocephalic.  Right Ear: External ear normal.  Left Ear: External ear normal.  Nose: Nose normal.  Mouth/Throat: Oropharynx is clear and moist.  Eyes: Conjunctivae are normal. Pupils are equal, round, and reactive to light. Right eye exhibits no discharge. Left eye exhibits no discharge.  Neck: Normal range of motion. Neck supple. No JVD present. No tracheal deviation present. No thyromegaly present.  Cardiovascular: Normal rate and regular rhythm.  Exam reveals no gallop and no friction rub.   Murmur (2/6) heard. Pulmonary/Chest: No stridor. No respiratory distress. She has no wheezes.  Abdominal: Soft. Bowel sounds are normal. She exhibits no distension and no mass. There is no tenderness. There is no rebound and no guarding.  Musculoskeletal: She exhibits no edema and no tenderness.  Lymphadenopathy:    She has no cervical adenopathy.  Neurological: She displays normal reflexes. No cranial nerve deficit. She exhibits normal muscle tone. Coordination normal.  Skin: Rash (hives and hyperpigmentation on LE and UEs B) noted. No erythema.       Diffuse nodular excoriations on back Eczema type rash on B shins  Psychiatric: She has a normal mood and affect. Her behavior is normal.  Thought content normal.       Sad a little  R knee is tender B abd poll longus -- tender Graft in L arm seems to be ok  Lab Results  Component Value Date   WBC 6.0 10/02/2009   HGB 10.8* 10/02/2009   HCT 33.7* 10/02/2009   PLT 228.0 10/02/2009    GLUCOSE 106* 10/02/2009   CHOL 178 11/24/2011   TRIG 153.0* 11/24/2011   HDL 66.90 11/24/2011   LDLDIRECT 89.3 06/13/2006   LDLCALC 81 11/24/2011   ALT 21 10/02/2009   AST 25 10/02/2009   NA 143 10/02/2009   K 4.0 10/02/2009   CL 105 10/02/2009   CREATININE 5.1* 10/02/2009   BUN 32* 10/02/2009   CO2 31 10/02/2009   TSH 1.75 10/02/2009   INR 2.2 01/18/2012   HGBA1C 5.7 10/02/2009   MICROALBUR 168.6* 10/31/2006       Assessment & Plan:

## 2012-03-07 ENCOUNTER — Other Ambulatory Visit: Payer: Self-pay | Admitting: Cardiology

## 2012-03-07 ENCOUNTER — Encounter (INDEPENDENT_AMBULATORY_CARE_PROVIDER_SITE_OTHER): Payer: Medicare Other

## 2012-03-07 DIAGNOSIS — I6529 Occlusion and stenosis of unspecified carotid artery: Secondary | ICD-10-CM

## 2012-03-10 ENCOUNTER — Ambulatory Visit (INDEPENDENT_AMBULATORY_CARE_PROVIDER_SITE_OTHER): Payer: Medicare Other | Admitting: *Deleted

## 2012-03-10 DIAGNOSIS — I4891 Unspecified atrial fibrillation: Secondary | ICD-10-CM

## 2012-03-10 DIAGNOSIS — Z7901 Long term (current) use of anticoagulants: Secondary | ICD-10-CM | POA: Diagnosis not present

## 2012-03-11 ENCOUNTER — Other Ambulatory Visit: Payer: Self-pay | Admitting: Cardiology

## 2012-03-15 DIAGNOSIS — E119 Type 2 diabetes mellitus without complications: Secondary | ICD-10-CM | POA: Diagnosis not present

## 2012-03-15 DIAGNOSIS — I10 Essential (primary) hypertension: Secondary | ICD-10-CM | POA: Diagnosis not present

## 2012-03-15 DIAGNOSIS — Z940 Kidney transplant status: Secondary | ICD-10-CM | POA: Diagnosis not present

## 2012-03-15 DIAGNOSIS — D649 Anemia, unspecified: Secondary | ICD-10-CM | POA: Diagnosis not present

## 2012-03-15 DIAGNOSIS — Z79899 Other long term (current) drug therapy: Secondary | ICD-10-CM | POA: Diagnosis not present

## 2012-03-15 DIAGNOSIS — N2581 Secondary hyperparathyroidism of renal origin: Secondary | ICD-10-CM | POA: Diagnosis not present

## 2012-03-16 DIAGNOSIS — D631 Anemia in chronic kidney disease: Secondary | ICD-10-CM | POA: Diagnosis not present

## 2012-03-16 DIAGNOSIS — N185 Chronic kidney disease, stage 5: Secondary | ICD-10-CM | POA: Diagnosis not present

## 2012-03-16 DIAGNOSIS — Z7901 Long term (current) use of anticoagulants: Secondary | ICD-10-CM | POA: Diagnosis not present

## 2012-03-16 DIAGNOSIS — Z940 Kidney transplant status: Secondary | ICD-10-CM | POA: Diagnosis not present

## 2012-03-16 DIAGNOSIS — Z79899 Other long term (current) drug therapy: Secondary | ICD-10-CM | POA: Diagnosis not present

## 2012-03-16 DIAGNOSIS — E119 Type 2 diabetes mellitus without complications: Secondary | ICD-10-CM | POA: Diagnosis not present

## 2012-03-16 DIAGNOSIS — I120 Hypertensive chronic kidney disease with stage 5 chronic kidney disease or end stage renal disease: Secondary | ICD-10-CM | POA: Diagnosis not present

## 2012-03-16 DIAGNOSIS — Z48298 Encounter for aftercare following other organ transplant: Secondary | ICD-10-CM | POA: Diagnosis not present

## 2012-03-22 ENCOUNTER — Other Ambulatory Visit: Payer: Self-pay | Admitting: Cardiology

## 2012-03-22 ENCOUNTER — Encounter (INDEPENDENT_AMBULATORY_CARE_PROVIDER_SITE_OTHER): Payer: Medicare Other

## 2012-03-22 DIAGNOSIS — R52 Pain, unspecified: Secondary | ICD-10-CM

## 2012-03-22 DIAGNOSIS — Z940 Kidney transplant status: Secondary | ICD-10-CM | POA: Diagnosis not present

## 2012-03-22 DIAGNOSIS — M79609 Pain in unspecified limb: Secondary | ICD-10-CM

## 2012-03-22 DIAGNOSIS — R609 Edema, unspecified: Secondary | ICD-10-CM

## 2012-03-22 DIAGNOSIS — N2581 Secondary hyperparathyroidism of renal origin: Secondary | ICD-10-CM | POA: Diagnosis not present

## 2012-03-28 ENCOUNTER — Telehealth: Payer: Self-pay | Admitting: Cardiology

## 2012-03-28 DIAGNOSIS — E11329 Type 2 diabetes mellitus with mild nonproliferative diabetic retinopathy without macular edema: Secondary | ICD-10-CM | POA: Diagnosis not present

## 2012-03-28 DIAGNOSIS — E1139 Type 2 diabetes mellitus with other diabetic ophthalmic complication: Secondary | ICD-10-CM | POA: Diagnosis not present

## 2012-03-28 NOTE — Telephone Encounter (Signed)
[name], Not sure what issue is here. [name]

## 2012-03-28 NOTE — Telephone Encounter (Signed)
Sonia Baller- Dr. Celine Ahr, [place]  [phone]  Please return call to Nurse Sonia Baller, to discuss past examinations

## 2012-03-29 NOTE — Telephone Encounter (Signed)
Left message for [name] to call me back.

## 2012-03-29 NOTE — Telephone Encounter (Signed)
Spoke with jenny, copy of carotid doppler sent to them

## 2012-03-31 ENCOUNTER — Ambulatory Visit (INDEPENDENT_AMBULATORY_CARE_PROVIDER_SITE_OTHER): Payer: Medicare Other

## 2012-03-31 DIAGNOSIS — I4891 Unspecified atrial fibrillation: Secondary | ICD-10-CM | POA: Diagnosis not present

## 2012-03-31 DIAGNOSIS — Z7901 Long term (current) use of anticoagulants: Secondary | ICD-10-CM

## 2012-04-07 DIAGNOSIS — H1130 Conjunctival hemorrhage, unspecified eye: Secondary | ICD-10-CM | POA: Diagnosis not present

## 2012-04-18 ENCOUNTER — Other Ambulatory Visit: Payer: Self-pay | Admitting: Internal Medicine

## 2012-04-19 DIAGNOSIS — Z79899 Other long term (current) drug therapy: Secondary | ICD-10-CM | POA: Diagnosis not present

## 2012-04-19 DIAGNOSIS — E039 Hypothyroidism, unspecified: Secondary | ICD-10-CM | POA: Diagnosis not present

## 2012-04-19 DIAGNOSIS — Z940 Kidney transplant status: Secondary | ICD-10-CM | POA: Diagnosis not present

## 2012-04-19 DIAGNOSIS — I10 Essential (primary) hypertension: Secondary | ICD-10-CM | POA: Diagnosis not present

## 2012-04-19 DIAGNOSIS — D649 Anemia, unspecified: Secondary | ICD-10-CM | POA: Diagnosis not present

## 2012-04-19 DIAGNOSIS — N2581 Secondary hyperparathyroidism of renal origin: Secondary | ICD-10-CM | POA: Diagnosis not present

## 2012-04-19 DIAGNOSIS — E119 Type 2 diabetes mellitus without complications: Secondary | ICD-10-CM | POA: Diagnosis not present

## 2012-04-25 DIAGNOSIS — I10 Essential (primary) hypertension: Secondary | ICD-10-CM | POA: Diagnosis not present

## 2012-04-25 DIAGNOSIS — I4891 Unspecified atrial fibrillation: Secondary | ICD-10-CM | POA: Diagnosis not present

## 2012-04-25 DIAGNOSIS — Z940 Kidney transplant status: Secondary | ICD-10-CM | POA: Diagnosis not present

## 2012-04-25 DIAGNOSIS — E119 Type 2 diabetes mellitus without complications: Secondary | ICD-10-CM | POA: Diagnosis not present

## 2012-04-28 ENCOUNTER — Ambulatory Visit (INDEPENDENT_AMBULATORY_CARE_PROVIDER_SITE_OTHER): Payer: Medicare Other

## 2012-04-28 DIAGNOSIS — Z7901 Long term (current) use of anticoagulants: Secondary | ICD-10-CM | POA: Diagnosis not present

## 2012-04-28 DIAGNOSIS — I4891 Unspecified atrial fibrillation: Secondary | ICD-10-CM | POA: Diagnosis not present

## 2012-04-28 LAB — POCT INR: INR: 5

## 2012-05-08 ENCOUNTER — Other Ambulatory Visit: Payer: Self-pay | Admitting: Dermatology

## 2012-05-08 DIAGNOSIS — B079 Viral wart, unspecified: Secondary | ICD-10-CM | POA: Diagnosis not present

## 2012-05-08 DIAGNOSIS — L290 Pruritus ani: Secondary | ICD-10-CM | POA: Diagnosis not present

## 2012-05-08 DIAGNOSIS — L299 Pruritus, unspecified: Secondary | ICD-10-CM | POA: Diagnosis not present

## 2012-05-08 DIAGNOSIS — L280 Lichen simplex chronicus: Secondary | ICD-10-CM | POA: Diagnosis not present

## 2012-05-10 ENCOUNTER — Ambulatory Visit (INDEPENDENT_AMBULATORY_CARE_PROVIDER_SITE_OTHER): Payer: Medicare Other | Admitting: *Deleted

## 2012-05-10 DIAGNOSIS — I4891 Unspecified atrial fibrillation: Secondary | ICD-10-CM | POA: Diagnosis not present

## 2012-05-10 DIAGNOSIS — L290 Pruritus ani: Secondary | ICD-10-CM | POA: Diagnosis not present

## 2012-05-10 DIAGNOSIS — Z7901 Long term (current) use of anticoagulants: Secondary | ICD-10-CM

## 2012-05-12 ENCOUNTER — Telehealth: Payer: Self-pay | Admitting: Internal Medicine

## 2012-05-12 ENCOUNTER — Encounter: Payer: Self-pay | Admitting: Internal Medicine

## 2012-05-12 ENCOUNTER — Ambulatory Visit (INDEPENDENT_AMBULATORY_CARE_PROVIDER_SITE_OTHER): Payer: Medicare Other | Admitting: Internal Medicine

## 2012-05-12 ENCOUNTER — Ambulatory Visit (INDEPENDENT_AMBULATORY_CARE_PROVIDER_SITE_OTHER)
Admission: RE | Admit: 2012-05-12 | Discharge: 2012-05-12 | Disposition: A | Discharge status: Home / Self Care | Payer: Medicare Other | Source: Ambulatory Visit | Attending: Internal Medicine | Admitting: Internal Medicine

## 2012-05-12 VITALS — BP 132/84 | HR 109 | Temp 99.2°F | Ht 62.0 in | Wt 171.0 lb

## 2012-05-12 DIAGNOSIS — R209 Unspecified disturbances of skin sensation: Secondary | ICD-10-CM | POA: Diagnosis not present

## 2012-05-12 DIAGNOSIS — M25539 Pain in unspecified wrist: Secondary | ICD-10-CM | POA: Diagnosis not present

## 2012-05-12 DIAGNOSIS — R202 Paresthesia of skin: Secondary | ICD-10-CM

## 2012-05-12 DIAGNOSIS — M79609 Pain in unspecified limb: Secondary | ICD-10-CM

## 2012-05-12 DIAGNOSIS — R200 Anesthesia of skin: Secondary | ICD-10-CM

## 2012-05-12 DIAGNOSIS — M79602 Pain in left arm: Secondary | ICD-10-CM

## 2012-05-12 MED ORDER — NAPROXEN 500 MG PO TABS: 500.0000 mg | ORAL_TABLET | Freq: Two times a day (BID) | ORAL | Status: DC

## 2012-05-12 NOTE — Telephone Encounter (Signed)
Agree. Thx 

## 2012-05-12 NOTE — Telephone Encounter (Signed)
Caller: Damita/Patient; Patient Name: [name]; PCP: Plotnikov, Alex (Adults only); Best Callback Phone Number: [phone]. Reports pain to the left forearm where her dialysis graft was placed. Patient is not currently on dialysis due to recent kidney transplant. Onset approximately one week. Has been using heating pad to the area which relieves the pain while there is heat. Reports that the pain feels like a gout attack. Reports that the left thumb tingles at times. Reports severe unbearable pain at times when moving the hand. Triaged per Arm Non-Injury guideline, disposition: see ED Immediately for "Unbearable pain." Triager believes an office visit is appropriate for this patient. Appointment scheduled 05/12/12 at 3:30pm with Webb Silversmith, NP. Advised patient she needs to be seen as soon as possible. Patient verbalized understanding.

## 2012-05-12 NOTE — Telephone Encounter (Signed)
Caller: [name]/Patient; Patient Name: [name]; PCP: Webb Silversmith; [facility] Call[facility] Phone Number: [phone].  Pharmacist calling about eRx for naprosyn 500mg  BID.  States patient is on coumadin; recommends change in Rx.  TC to Dr. Deborra Medina; Rx for tramedol  50mg , 1 tab BID #30 NR ordered.

## 2012-05-12 NOTE — Patient Instructions (Addendum)
Thank you for allowing me to spend time with you today. I have sent the antiinflammatory into your pharmacy. Be sure to take it with food to avoid irritation of your stomach. I will let you know what the results of your xray are. Continue to use the heating pad and alternate with ice for 15 minutes two times per day. Keep your appointment with Dr. Alain Marion and f/u with him regarding your questions about diabetic neuropathy.  If you have any further questions, please feel free to call the office at any time and leave a message or you can utilize the MyChart features to send me a quick note, and I will get back with you as soon as possible. I have included the instructions below on how to sign up for my chart.  Have a wonderful day! Webb Silversmith, NP-C How Do I Sign Up? 1. In your Internet browser, go to [email or web address]. 2. Click on the New  User? link in the Sign In box.  3. Enter your MyChart Access Code exactly as it appears below. You will not need to use this code after you have completed the sign-up process. If you do not sign up before the expiration date, you must request a new code. MyChart Access Code: [phone] Expires: 06/11/2012  4:07 PM  4. Enter the last four digits of your Social Security Number (xxxx) and Date of Birth (mm/dd/yyyy) as indicated and click Next. You will be taken to the next sign-up page. 5. Create a MyChart ID. This will be your MyChart login ID and cannot be changed, so think of one that is secure and easy to remember. 6. Create a MyChart password. You can change your password at any time. 7. Enter your Password Reset Question and Answer and click Next. This can be used at a later time if you forget your password.  8. Select your communication preference, and if applicable enter your e-mail address. You will receive e-mail notification when new information is available in MyChart by choosing to receive e-mail notifications and filling  in your e-mail. 9. Click Sign In. You can now view your medical record.   Additional Information If you have questions, you can email [email or web address] or call [phone] to talk to our [place] staff. Remember, MyChart is NOT to be used for urgent needs. For medical emergencies, dial 911.

## 2012-05-12 NOTE — Progress Notes (Signed)
  Subjective:    [name], female    DOB: 12/02/1945, 66 y.o.   MRN: [id number]  HPI  Pt comes in today with c/o severe unrelenting left arm pain with numbness and tingling of the fourth and fifth fingers on the left hand. Pt does not recall any trauma to the arm but has pain anytime she moves her wrist or turns her forearm. Pain is 8/10 and is only mildly relieved by the use of a heat pad and relief is temporary. Pt has also be applying icyhot to the affected area.  Pertinent PMH, FH, SH, medications and allergies reviewed.    Review of Systems    Constitutional: Denies fever, malaise, dizziness or headaches.  Cardiovascular: Denies chest pain, chest tighntess or unilateral weakness.  Musculoskeletal: Positive left wrist and forearm pain, numbness and tingling.   No other specific complaints in a complete review of systems (except as listed in HPI above).    Objective:   Physical Exam  PE BP 132/84  Pulse 109  Temp 99.2 F (37.3 C) (Oral)  Ht 5\' 2"  (1.575 m)  Wt 171 lb (77.565 kg)  BMI 31.28 kg/m2  SpO2 97%  General: well developed, well nourished in no acute distress Skin: Bilateral forearms and hands eqaully warm, skin appropriate for race. intact Cardiac: Regular rate and rhythm, normal S1, S2, faint murmer; radial pulses 2t bilaterally.  Lung: clear to ausculation -no wheeze or crackle, good air movement bilaterally without increased work of breathing MSK: Pinpoint tenderness of the left radius adjacent to the left wrist. No swelling or erythema noted. Pt does have equal grip strength bilaterally but slightly limited active ROM of the left wrist and elbow. Sensation intact.      Assessment & Plan:   Left forearm pain: new problem with additional workup required Left forearm numbness and tingling: new problem with additional workup required  Will obtain xray of the left forearm along with the left wrist to r/o bony deformity. Sent Naprosyn 500 mg  BID via [place] for pain management  Pt has appt with Dr. Alain Marion 05/29/12. Will discuss possibility of treatment for diabetic neuropathy with him at that visit

## 2012-05-15 ENCOUNTER — Telehealth: Payer: Self-pay | Admitting: *Deleted

## 2012-05-15 ENCOUNTER — Telehealth: Payer: Self-pay | Admitting: Internal Medicine

## 2012-05-15 DIAGNOSIS — M25532 Pain in left wrist: Secondary | ICD-10-CM

## 2012-05-15 DIAGNOSIS — L290 Pruritus ani: Secondary | ICD-10-CM | POA: Diagnosis not present

## 2012-05-15 MED ORDER — HYDROCODONE-ACETAMINOPHEN 5-500 MG PO TABS: 1.0000 | ORAL_TABLET | Freq: Three times a day (TID) | ORAL | Status: DC | PRN

## 2012-05-15 NOTE — Telephone Encounter (Signed)
Susan-Pharmacist regarding rx for Vicodin-per pharmacist, insurance company is rejecting NPI and DEA #. Needs an attending physician or another physician to verify rx.

## 2012-05-15 NOTE — Telephone Encounter (Signed)
Can you please call Manuela Schwartz, the paharmacist at [facility] and let them know Dr. Hermina Staggers has verified the Vicodin for this patient?  Thank you!

## 2012-05-15 NOTE — Telephone Encounter (Signed)
Pharmacy informed.

## 2012-05-15 NOTE — Telephone Encounter (Signed)
OK to fill each prescription with additional refills x3 Thank you!

## 2012-05-15 NOTE — Telephone Encounter (Signed)
Spoke with patient on the phone making her aware that her xray images of her left forearm/wrist were normal with no evidence of fracture. Given eRx for Naprosyn. Pharmacy called 05/12/12 and asked for medication to be changed due to interaction with coumadin. eRx sent if for tramadol. After speaking with patient today, she is still in moderate pain but unable to take tramadol due to side effects of nausea and vomiting. eRx sent into pharmacy for Vicoden for pain management. Pt understands and agrees to treatment plan.

## 2012-05-16 ENCOUNTER — Telehealth: Payer: Self-pay | Admitting: Internal Medicine

## 2012-05-16 ENCOUNTER — Other Ambulatory Visit: Payer: Self-pay | Admitting: Cardiology

## 2012-05-16 ENCOUNTER — Encounter (INDEPENDENT_AMBULATORY_CARE_PROVIDER_SITE_OTHER): Payer: Medicare Other

## 2012-05-16 DIAGNOSIS — M79632 Pain in left forearm: Secondary | ICD-10-CM

## 2012-05-16 DIAGNOSIS — M79609 Pain in unspecified limb: Secondary | ICD-10-CM | POA: Diagnosis not present

## 2012-05-16 DIAGNOSIS — R52 Pain, unspecified: Secondary | ICD-10-CM

## 2012-05-16 DIAGNOSIS — M7989 Other specified soft tissue disorders: Secondary | ICD-10-CM | POA: Diagnosis not present

## 2012-05-16 NOTE — Telephone Encounter (Signed)
Called patient to check on the status of her arm. Pt states the pain is only mildly relieved by the Vicodin and is still painful and mildly swollen. Ordered US of left arm to rule out clot. Referral nurse aware. Pt understands and agrees to plan.

## 2012-05-17 DIAGNOSIS — L290 Pruritus ani: Secondary | ICD-10-CM | POA: Diagnosis not present

## 2012-05-18 ENCOUNTER — Other Ambulatory Visit: Payer: Self-pay | Admitting: Internal Medicine

## 2012-05-18 DIAGNOSIS — R200 Anesthesia of skin: Secondary | ICD-10-CM

## 2012-05-18 DIAGNOSIS — M25532 Pain in left wrist: Secondary | ICD-10-CM

## 2012-05-18 DIAGNOSIS — M79632 Pain in left forearm: Secondary | ICD-10-CM

## 2012-05-18 NOTE — Progress Notes (Signed)
Pt sent for [place] of LUE to rule out DVT due to continued pain and tingling. Per report, study negative for DVT but worrisome for steal syndrome. Pt informed of study results and referral sent in for vascular surgery to evaluate and order LUE arterial study.

## 2012-05-19 ENCOUNTER — Other Ambulatory Visit: Payer: Self-pay

## 2012-05-19 DIAGNOSIS — L290 Pruritus ani: Secondary | ICD-10-CM | POA: Diagnosis not present

## 2012-05-19 DIAGNOSIS — M79603 Pain in arm, unspecified: Secondary | ICD-10-CM

## 2012-05-22 ENCOUNTER — Ambulatory Visit (INDEPENDENT_AMBULATORY_CARE_PROVIDER_SITE_OTHER): Payer: Medicare Other | Admitting: *Deleted

## 2012-05-22 ENCOUNTER — Encounter: Payer: Self-pay | Admitting: Surgery

## 2012-05-22 ENCOUNTER — Ambulatory Visit (INDEPENDENT_AMBULATORY_CARE_PROVIDER_SITE_OTHER): Payer: Medicare Other | Admitting: Surgery

## 2012-05-22 VITALS — BP 158/107 | HR 88 | Resp 18 | Ht 62.0 in | Wt 173.0 lb

## 2012-05-22 DIAGNOSIS — M79609 Pain in unspecified limb: Secondary | ICD-10-CM

## 2012-05-22 DIAGNOSIS — L290 Pruritus ani: Secondary | ICD-10-CM | POA: Diagnosis not present

## 2012-05-22 DIAGNOSIS — M79603 Pain in arm, unspecified: Secondary | ICD-10-CM

## 2012-05-22 NOTE — Progress Notes (Signed)
[facility]   Patient name: [name] MRN: [id number] DOB: 12-12-1945 Sex: female     Chief Complaint  Patient presents with  . ESRD    Left forearm painful from wrist to shoulder, duration 2-3 weeks.    HISTORY OF PRESENT ILLNESS: The patient comes in today with complaints of pain in her left arm for over 2 weeks. She has attempted to use hot packs which have shown some benefit. She is also taking pain medicine which does help. She states that it hurts so bad at one point she broke 47. She describes the pain as being from her elbow to her wrist. It is a throbbing pain was in the bone. She states that it does not involve her hand or fingers. She denies any numbness or: Less than her arm. She denies any swelling.  The patient is no longer on dialysis as she has received a kidney transplant. She has a left upper arm loop graft is still in place which is no longer needed. She does have a history of left forearm graft removal secondary to infection.  The patient denies having any fevers or chills at home. She did have a outside ultrasound which showed no evidence of thrombus however there was a atypical signal within the radial artery.  Past Medical History  Diagnosis Date  . Coronary artery disease 05/2002    s/p acute inf, MI  . Paroxysmal a-fib   . Myocardial infarction     hx of  . Hypertension   . Hyperlipidemia   . CHF (congestive heart failure)   . Renal failure     end stage renal disease;  M,W,F DIALYSIS  . Pericardial effusion   . 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 GERD (gastroesophageal reflux disease)   . Malignant neoplasm of kidney   . 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 Unspecified hearing loss   . Diabetes mellitus     type 2  . Anemia, iron deficiency     of chronic disease  . Helicobacter pylori (H. pylori) infection     hx of  . Esophagitis, reflux   .  Gout   . Sleep apnea   . Cholelithiasis   . Hx of colonic polyps   . Diverticulosis of colon   . Streptococcal infection group D enterococcus   . Chronic cough onset 03/2010    Dr Melvyn Novas    Past Surgical History  Procedure Date  . Pci of rt coronary artery   . Cholecystectomy 2009  . Left av graft     removal of  . Right av fistula   . Nasal septal     surgery  . Tonsillectomy   . Abdominal hysterectomy   . Oophorectomy   . Tubal ligation   . Nephrectomy 2010    no CA on bx  . Kidney transplant 03/16/11    History   Social History  . Marital Status: Married    Spouse Name: N/A    Number of Children: N/A  . Years of Education: N/A   Occupational History  . retired    Social History Main Topics  . Smoking status: Former Smoker -- 1.0 packs/day for 30 years    Types: Cigarettes    Quit date: 07/19/2001  . Smokeless tobacco: Not on file  . Alcohol Use: No  . Drug Use: No  . Sexually  Active: Not Currently   Other Topics Concern  . Not on file   Social History Narrative   Patient signed a Designated Party Release to allow her spouse [name] and family and five children to have access to her medical records/information.      Family History  Problem Relation Age of Onset  . Stroke Father   . Diabetes Other   . Hypertension Mother     Allergies as of 05/22/2012 - Review Complete 05/22/2012  Allergen Reaction Noted  . Ibuprofen Nausea And Vomiting 05/22/2012  . Sulfamethoxazole w-trimethoprim Swelling   . Sulfonamide derivatives Swelling   . Tape Rash 01/26/2012  . Tramadol Nausea And Vomiting 05/22/2012  . Bactrim Swelling 10/15/2010    Current Outpatient Prescriptions on File Prior to Visit  Medication Sig Dispense Refill  . cinacalcet (SENSIPAR) 60 MG tablet Take 60 mg by mouth every other day.      . dapsone 25 MG tablet Take 25 mg by mouth daily.        . diphenhydrAMINE (BENADRYL) 50 MG capsule Take 1 capsule (50 mg total) by mouth every 6 (six)  hours as needed for itching.  90 capsule  3  . esomeprazole (NEXIUM) 40 MG capsule Take 40 mg by mouth daily before breakfast.        . glipiZIDE (GLUCOTROL XL) 10 MG 24 hr tablet TAKE 1 TABLET BY MOUTH TWICE A DAY  60 tablet  5  . HYDROcodone-acetaminophen (VICODIN) 5-500 MG per tablet Take 1 tablet by mouth every 8 (eight) hours as needed for pain.  20 tablet  0  . labetalol (NORMODYNE) 100 MG tablet Take 50 mg by mouth 2 (two) times daily.       . mycophenolate (MYFORTIC) 180 MG EC tablet Take 360 mg by mouth 2 (two) times daily.        . polyethylene glycol (MIRALAX / GLYCOLAX) packet Take 17 g by mouth 2 (two) times daily. As needed       . pravastatin (PRAVACHOL) 40 MG tablet Take 40 mg by mouth. Take one tablet Mon, Wed, and Friday       . sennosides-docusate sodium (SENOKOT-S) 8.6-50 MG tablet Take 2 tablets by mouth daily.        . sitaGLIPtin (JANUVIA) 100 MG tablet Take 100 mg by mouth daily.        . tacrolimus (PROGRAF) 1 MG capsule Take 1 mg by mouth 2 (two) times daily. Take  7 Tabs in am and 6 in th pm      . triamcinolone cream (KENALOG) 0.1 % Apply topically 2 (two) times daily.      Marland Kitchen warfarin (COUMADIN) 5 MG tablet TAKE AS DIRECTED BY ANTICOAGULATION CLINIC  50 tablet  3  . desoximetasone (TOPICORT) 0.25 % cream Apply topically 2 (two) times daily.  100 g  3  . naproxen (NAPROSYN) 500 MG tablet Take 1 tablet (500 mg total) by mouth 2 (two) times daily with a meal.  30 tablet  0     REVIEW OF SYSTEMS: Cardiovascular: No chest pain, chest pressure, palpitations, orthopnea, or dyspnea on exertion. No claudication or rest pain,  No history of DVT or phlebitis. Pulmonary: No productive cough, asthma or wheezing. Neurologic: No  paresthesias, aphasia, or amaurosis. No dizziness. Is weakness in her left arm secondary to the above complaint Hematologic: No bleeding problems or clotting disorders. Musculoskeletal: No joint pain or joint swelling. Gastrointestinal: No blood in stool  or hematemesis Genitourinary: No dysuria  or hematuria. Status post kidney transplant Psychiatric:: No history of major depression. Integumentary: Positive for rash Constitutional: No fever or chills.  PHYSICAL EXAMINATION:   Vital signs are BP 158/107  Pulse 88  Resp 18  Ht 5\' 2"  (1.575 m)  Wt 173 lb (78.472 kg)  BMI 31.64 kg/m2  SpO2 94% General: The patient appears their stated age. HEENT:  No gross abnormalities Pulmonary:  Non labored breathing Musculoskeletal: There are no major deformities. Neurologic: No focal weakness or paresthesias are detected, Skin: There are no ulcer or rashes noted. Psychiatric: The patient has normal affect. Cardiovascular: Palpable left upper arm loop graft. Palpable left radial pulse. There is no evidence of infection in the upper arm.   Diagnostic Studies Duplex ultrasound was ordered and reviewed. This shows mild narrowing at the venous outflow tract. There is no evidence of perigraft fluid or in graft stenosis. There is a triphasic antegrade left radial artery. The radial artery pressure goes from 72 cm/s to 96 cm/s with graft compression  Assessment: Left arm pain Plan: Based on the patient's description of her problem and pain, I do not believe that this is related to her left upper arm loop graft. By ultrasound she has adequate perfusion to her left hand. There is a mild finding of steal by ultrasound, however the patient's clinical symptoms do not support this. I do not believe that there is any evidence of infection in the arm that could be creating her complaints. I discussed with the patient that I do not believe her graft is causing problems however I cannot pinpoint where the etiology of her pain is at this time. Going forward, I would rule out all other potential causes and if there is no other source and she continues to have persistent pain I can ligate her graft. An upper not to do this unless is a last resort. She will contact me if it  comes to this.  Eldridge Abrahams, M.D. [facility] [place] Office: [phone] Pager:  [phone]

## 2012-05-24 DIAGNOSIS — L290 Pruritus ani: Secondary | ICD-10-CM | POA: Diagnosis not present

## 2012-05-25 ENCOUNTER — Ambulatory Visit (INDEPENDENT_AMBULATORY_CARE_PROVIDER_SITE_OTHER): Payer: Medicare Other

## 2012-05-25 ENCOUNTER — Encounter: Payer: Self-pay | Admitting: *Deleted

## 2012-05-25 ENCOUNTER — Encounter: Payer: Self-pay | Admitting: Cardiology

## 2012-05-25 ENCOUNTER — Ambulatory Visit (INDEPENDENT_AMBULATORY_CARE_PROVIDER_SITE_OTHER): Payer: Medicare Other | Admitting: Cardiology

## 2012-05-25 ENCOUNTER — Other Ambulatory Visit: Payer: Self-pay | Admitting: Cardiology

## 2012-05-25 VITALS — BP 142/80 | HR 126 | Wt 172.0 lb

## 2012-05-25 DIAGNOSIS — I4891 Unspecified atrial fibrillation: Secondary | ICD-10-CM

## 2012-05-25 DIAGNOSIS — I2510 Atherosclerotic heart disease of native coronary artery without angina pectoris: Secondary | ICD-10-CM | POA: Diagnosis not present

## 2012-05-25 DIAGNOSIS — I319 Disease of pericardium, unspecified: Secondary | ICD-10-CM | POA: Diagnosis not present

## 2012-05-25 DIAGNOSIS — I6529 Occlusion and stenosis of unspecified carotid artery: Secondary | ICD-10-CM

## 2012-05-25 DIAGNOSIS — Z7901 Long term (current) use of anticoagulants: Secondary | ICD-10-CM | POA: Diagnosis not present

## 2012-05-25 DIAGNOSIS — E785 Hyperlipidemia, unspecified: Secondary | ICD-10-CM | POA: Diagnosis not present

## 2012-05-25 DIAGNOSIS — I359 Nonrheumatic aortic valve disorder, unspecified: Secondary | ICD-10-CM

## 2012-05-25 LAB — CBC WITH DIFFERENTIAL/PLATELET
Basophils Relative: 0.3 % (ref 0.0–3.0)
Eosinophils Relative: 1.5 % (ref 0.0–5.0)
HCT: 40.4 % (ref 36.0–46.0)
Hemoglobin: 12.7 g/dL (ref 12.0–15.0)
Lymphs Abs: 0.8 10*3/uL (ref 0.7–4.0)
MCV: 84.1 fl (ref 78.0–100.0)
Monocytes Absolute: 0.7 10*3/uL (ref 0.1–1.0)
Monocytes Relative: 11.6 % (ref 3.0–12.0)
Neutro Abs: 4.2 10*3/uL (ref 1.4–7.7)
RBC: 4.8 Mil/uL (ref 3.87–5.11)
WBC: 5.7 10*3/uL (ref 4.5–10.5)

## 2012-05-25 LAB — BASIC METABOLIC PANEL
BUN: 28 mg/dL — ABNORMAL HIGH (ref 6–23)
CO2: 23 mEq/L (ref 19–32)
Calcium: 8.7 mg/dL (ref 8.4–10.5)
Chloride: 104 mEq/L (ref 96–112)
Creatinine, Ser: 1.4 mg/dL — ABNORMAL HIGH (ref 0.4–1.2)
Glucose, Bld: 188 mg/dL — ABNORMAL HIGH (ref 70–99)

## 2012-05-25 LAB — TSH: TSH: 1.76 u[IU]/mL (ref 0.35–5.50)

## 2012-05-25 MED ORDER — METOPROLOL TARTRATE 25 MG PO TABS: 25.0000 mg | ORAL_TABLET | Freq: Two times a day (BID) | ORAL | Status: DC

## 2012-05-25 NOTE — Assessment & Plan Note (Signed)
Continue statin. Followup carotid Dopplers August 2015.

## 2012-05-25 NOTE — Patient Instructions (Addendum)
Your physician recommends that you HAVE LAB WORK TODAY  Your physician has recommended you make the following change in your medication: STOP LABETALOL  START METOPROLOL 25 MG TWICE DAILY  Your physician recommends that you schedule a follow-up appointment in: [place] has recommended that you have a Cardioversion (DCCV). Electrical Cardioversion uses a jolt of electricity to your heart either through paddles or wired patches attached to your chest. This is a controlled, usually prescheduled, procedure. Defibrillation is done under light anesthesia in the hospital, and you usually go home the day of the procedure. This is done to get your heart back into a normal rhythm. You are not awake for the procedure. Please see the instruction sheet given to you today.

## 2012-05-25 NOTE — Progress Notes (Signed)
HPI: Pleasant female who presents for fu of aortic stenosis, pericardial effusion and atrial fibrillation. She has a history of coronary artery disease, status post PCI of the right coronary artery. Last LHC 5/05: Mid RCA 40%, EF 60%. 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 An echocardiogram showed normal LV function and a small pericardial effusion. She subsequently had a TEE which revealed normal LV function and a moderate effusion. There were no vegetations. She was placed on Coumadin with a diagnosis of paroxysmal atrial fibrillation. Carotid Dopplers performed in August 2013 showed 40-59% left stenosis and 0-39% right; f/u recommended in 2 years. An echocardiogram at [facility] prior to renal transplant apparently demonstrated severe AS. TEE was done to follow up in 3/12: EF 60-65%, moderate AS, mild-moderate LAE, small pericardial effusion, mean AV gradient 17 mm of mercury. 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 Last echocardiogram in May of 2013 showed normal LV function, moderate LVH, moderate aortic stenosis with a mean gradient of 32 mm of mercury, mild left atrial enlargement and a small pericardial effusion. I last saw her in April of 2013. Since then, she has some dyspnea on exertion but no orthopnea, PND, pedal edema, palpitations, syncope or chest pain.   Current Outpatient Prescriptions  Medication Sig Dispense Refill  . cinacalcet (SENSIPAR) 60 MG tablet Take 60 mg by mouth every other day.      . dapsone 25 MG tablet Take 25 mg by mouth daily.        . diphenhydrAMINE (BENADRYL) 50 MG capsule Take 1 capsule (50 mg total) by mouth every 6 (six) hours as needed for itching.  90 capsule  3  . esomeprazole (NEXIUM) 40 MG capsule Take 40 mg by mouth daily before breakfast.        . glipiZIDE (GLUCOTROL XL) 10 MG 24 hr tablet TAKE 1 TABLET BY MOUTH TWICE A DAY  60 tablet  5  .  HYDROcodone-acetaminophen (VICODIN) 5-500 MG per tablet Take 1 tablet by mouth every 8 (eight) hours as needed for pain.  20 tablet  0  . labetalol (NORMODYNE) 100 MG tablet Take 50 mg by mouth 2 (two) times daily.       . mycophenolate (MYFORTIC) 180 MG EC tablet Take 360 mg by mouth 2 (two) times daily.        . naproxen (NAPROSYN) 500 MG tablet Take 1 tablet (500 mg total) by mouth 2 (two) times daily with a meal.  30 tablet  0  . polyethylene glycol (MIRALAX / GLYCOLAX) packet Take 17 g by mouth 2 (two) times daily. As needed       . pravastatin (PRAVACHOL) 40 MG tablet Take one tablet Mon, Wed, and Friday      . sennosides-docusate sodium (SENOKOT-S) 8.6-50 MG tablet Take 2 tablets by mouth as needed.       . sitaGLIPtin (JANUVIA) 100 MG tablet Take 100 mg by mouth daily.        . tacrolimus (PROGRAF) 1 MG capsule Take  7 Tabs in am and 6 in th pm      . triamcinolone cream (KENALOG) 0.1 % Apply topically 2 (two) times daily.      Marland Kitchen warfarin (COUMADIN) 5 MG tablet TAKE AS DIRECTED BY ANTICOAGULATION CLINIC  50 tablet  3     Past Medical History  Diagnosis Date  . Coronary artery disease 05/2002    s/p  acute inf, MI  . Paroxysmal a-fib   . Myocardial infarction     hx of  . Hypertension   . Hyperlipidemia   . CHF (congestive heart failure)   . Renal failure     end stage renal disease;  M,W,F DIALYSIS  . Pericardial effusion   . 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 GERD (gastroesophageal reflux disease)   . Malignant neoplasm of kidney   . 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 Unspecified hearing loss   . Diabetes mellitus     type 2  . Anemia, iron deficiency     of chronic disease  . Helicobacter pylori (H. pylori) infection     hx of  . Esophagitis, reflux   . Gout   . Sleep apnea   . Cholelithiasis   . Hx of colonic polyps   . Diverticulosis of colon   . Streptococcal infection group D enterococcus    . Chronic cough onset 03/2010    Dr Melvyn Novas    Past Surgical History  Procedure Date  . Pci of rt coronary artery   . Cholecystectomy 2009  . Left av graft     removal of  . Right av fistula   . Nasal septal     surgery  . Tonsillectomy   . Abdominal hysterectomy   . Oophorectomy   . Tubal ligation   . Nephrectomy 2010    no CA on bx  . Kidney transplant 03/16/11    History   Social History  . Marital Status: Married    Spouse Name: N/A    Number of Children: N/A  . Years of Education: N/A   Occupational History  . retired    Social History Main Topics  . Smoking status: Former Smoker -- 1.0 packs/day for 30 years    Types: Cigarettes    Quit date: 07/19/2001  . Smokeless tobacco: Not on file  . Alcohol Use: No  . Drug Use: No  . Sexually Active: Not Currently   Other Topics Concern  . Not on file   Social History Narrative   Patient signed a Designated Party Release to allow her spouse [name] and family and five children to have access to her medical records/information.      ROS: no fevers or chills, productive cough, hemoptysis, dysphasia, odynophagia, melena, hematochezia, dysuria, hematuria, rash, seizure activity, orthopnea, PND, pedal edema, claudication. Remaining systems are negative.  Physical Exam: Well-developed well-nourished in no acute distress.  Skin is warm and dry.  HEENT is normal.  Neck is supple.  Chest is clear to auscultation with normal expansion.  Cardiovascular exam is irregular, 2/6 systolic murmur left sternal border. Abdominal exam nontender or distended. No masses palpated. Extremities show no edema. neuro grossly intact  ECG atrial fibrillation at a rate of 100. Axis normal. Cannot rule out prior septal infarct. Nonspecific ST changes.

## 2012-05-25 NOTE — Assessment & Plan Note (Signed)
Will plan followup echocardiograms in the future. Small on most recent echo.

## 2012-05-25 NOTE — Assessment & Plan Note (Signed)
Patient has developed recurrent atrial fibrillation. Discontinue labetalol. Add metoprolol 25 mg by mouth twice a day for rate control. Continue Coumadin. Check TSH. Check INR today. If she is greater than 2 then we will proceed with elective cardioversion. Note she has been therapeutic for greater than 3 weeks if her INR is therapeutic today. If she develops recurrent atrial fibrillation in the future with increasing frequency she may require an antiarrhythmic.

## 2012-05-25 NOTE — Assessment & Plan Note (Signed)
Continue statin. Not on aspirin given need for Coumadin. 

## 2012-05-25 NOTE — Assessment & Plan Note (Signed)
Plan followup echocardiogram may 2014. She will most likely require aortic valve replacement in the future.

## 2012-05-25 NOTE — Assessment & Plan Note (Signed)
Continue statin. 

## 2012-05-26 DIAGNOSIS — L290 Pruritus ani: Secondary | ICD-10-CM | POA: Diagnosis not present

## 2012-05-29 ENCOUNTER — Encounter (HOSPITAL_COMMUNITY): Payer: Self-pay | Admitting: Certified Registered"

## 2012-05-29 ENCOUNTER — Ambulatory Visit (HOSPITAL_COMMUNITY)
Admission: RE | Admit: 2012-05-29 | Discharge: 2012-05-29 | Disposition: A | Discharge status: Home / Self Care | Payer: Medicare Other | Source: Ambulatory Visit | Attending: Cardiology | Admitting: Cardiology

## 2012-05-29 ENCOUNTER — Ambulatory Visit: Payer: Medicare Other | Admitting: Internal Medicine

## 2012-05-29 ENCOUNTER — Encounter (HOSPITAL_COMMUNITY): Payer: Self-pay

## 2012-05-29 ENCOUNTER — Ambulatory Visit (HOSPITAL_COMMUNITY): Payer: Medicare Other | Admitting: Certified Registered"

## 2012-05-29 ENCOUNTER — Encounter (HOSPITAL_COMMUNITY)
Admission: RE | Disposition: A | Discharge status: Home / Self Care | Payer: Self-pay | Source: Ambulatory Visit | Attending: Cardiology

## 2012-05-29 DIAGNOSIS — I4891 Unspecified atrial fibrillation: Secondary | ICD-10-CM

## 2012-05-29 HISTORY — PX: CARDIOVERSION: SHX1299

## 2012-05-29 SURGERY — CARDIOVERSION
Anesthesia: Monitor Anesthesia Care

## 2012-05-29 MED ORDER — PROPOFOL 10 MG/ML IV BOLUS
INTRAVENOUS | Status: DC | PRN
  Administered 2012-05-29: 70 mg via INTRAVENOUS

## 2012-05-29 MED ORDER — HYDROCORTISONE 1 % EX CREA: 1.0000 "application " | TOPICAL_CREAM | Freq: Three times a day (TID) | CUTANEOUS | Status: DC | PRN

## 2012-05-29 MED ORDER — LIDOCAINE HCL (CARDIAC) 20 MG/ML IV SOLN
INTRAVENOUS | Status: DC | PRN
  Administered 2012-05-29: 40 mg via INTRAVENOUS

## 2012-05-29 MED ORDER — SODIUM CHLORIDE 0.9 % IJ SOLN: 3.0000 mL | Freq: Two times a day (BID) | INTRAMUSCULAR | Status: DC

## 2012-05-29 MED ORDER — SODIUM CHLORIDE 0.9 % IV SOLN: 250.0000 mL | INTRAVENOUS | Status: DC

## 2012-05-29 MED ORDER — SODIUM CHLORIDE 0.9 % IJ SOLN: 3.0000 mL | INTRAMUSCULAR | Status: DC | PRN

## 2012-05-29 NOTE — Procedures (Signed)
Electrical Cardioversion Procedure Note [name] [id number] July 22, 1945  Procedure: Electrical Cardioversion Indications:  Atrial Fibrillation  Procedure Details Consent: Risks of procedure as well as the alternatives and risks of each were explained to the (patient/caregiver).  Consent for procedure obtained. Time Out: Verified patient identification, verified procedure, site/side was marked, verified correct patient position, special equipment/implants available, medications/allergies/relevent history reviewed, required imaging and test results available.  Performed  Patient placed on cardiac monitor, pulse oximetry, supplemental oxygen as necessary.  Sedation given: Diprovan 70 mg IV administered by anesthesia. Pacer pads placed anterior and posterior chest.  Cardioverted 1 time(s).  Cardioverted at 120J.  Evaluation Findings: Post procedure EKG shows: NSR Complications: None Patient did tolerate procedure well.   [name] 05/29/2012, 11:35 AM

## 2012-05-29 NOTE — H&P (Signed)
[name]   05/25/2012 1:45 PM Office Visit  MRN: [id number]   Description: 66 year old female  Provider: Lelon Perla, MD  Department: [facility]        Referring Provider     [name] Anger, MD      Diagnoses     Unspecified disease of pericardium   - Primary    423.9    Other and unspecified hyperlipidemia     272.4    Coronary atherosclerosis of unspecified type of vessel, native or graft     414.00    Atrial fibrillation     427.31    Occlusion and stenosis of carotid artery without mention of cerebral infarction     433.10    Aortic valve disorders     424.1      Reason for Visit     Follow-up    Aortic stenosis         Progress Notes     [name] Ruths, MD  05/25/2012  3:00 PM  Signed    HPI: Pleasant female who presents for fu of aortic stenosis, pericardial effusion and atrial fibrillation. She has a history of coronary artery disease, status post PCI of the right coronary artery. Last LHC 5/05: Mid RCA 40%, EF 60%. 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 An echocardiogram showed normal LV function and a small pericardial effusion. She subsequently had a TEE which revealed normal LV function and a moderate effusion. There were no vegetations. She was placed on Coumadin with a diagnosis of paroxysmal atrial fibrillation. Carotid Dopplers performed in August 2013 showed 40-59% left stenosis and 0-39% right; f/u recommended in 2 years. An echocardiogram at [facility] prior to renal transplant apparently demonstrated severe AS. TEE was done to follow up in 3/12: EF 60-65%, moderate AS, mild-moderate LAE, small pericardial effusion, mean AV gradient 17 mm of mercury. 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 Last echocardiogram in May of 2013 showed normal LV function, moderate LVH, moderate aortic stenosis with a mean gradient of 32 mm  of mercury, mild left atrial enlargement and a small pericardial effusion. I last saw her in April of 2013. Since then, she has some dyspnea on exertion but no orthopnea, PND, pedal edema, palpitations, syncope or chest pain.      Current Outpatient Prescriptions   Medication  Sig  Dispense  Refill   .  cinacalcet (SENSIPAR) 60 MG tablet  Take 60 mg by mouth every other day.         .  dapsone 25 MG tablet  Take 25 mg by mouth daily.           .  diphenhydrAMINE (BENADRYL) 50 MG capsule  Take 1 capsule (50 mg total) by mouth every 6 (six) hours as needed for itching.   90 capsule   3   .  esomeprazole (NEXIUM) 40 MG capsule  Take 40 mg by mouth daily before breakfast.           .  glipiZIDE (GLUCOTROL XL) 10 MG 24 hr tablet  TAKE 1 TABLET BY MOUTH TWICE A DAY   60 tablet   5   .  HYDROcodone-acetaminophen (VICODIN) 5-500 MG per tablet  Take 1 tablet by mouth every 8 (eight) hours as needed for pain.   20 tablet   0   .  labetalol (NORMODYNE)  100 MG tablet  Take 50 mg by mouth 2 (two) times daily.          .  mycophenolate (MYFORTIC) 180 MG EC tablet  Take 360 mg by mouth 2 (two) times daily.           .  naproxen (NAPROSYN) 500 MG tablet  Take 1 tablet (500 mg total) by mouth 2 (two) times daily with a meal.   30 tablet   0   .  polyethylene glycol (MIRALAX / GLYCOLAX) packet  Take 17 g by mouth 2 (two) times daily. As needed          .  pravastatin (PRAVACHOL) 40 MG tablet  Take one tablet Mon, Wed, and Friday         .  sennosides-docusate sodium (SENOKOT-S) 8.6-50 MG tablet  Take 2 tablets by mouth as needed.          .  sitaGLIPtin (JANUVIA) 100 MG tablet  Take 100 mg by mouth daily.           .  tacrolimus (PROGRAF) 1 MG capsule  Take  7 Tabs in am and 6 in th pm         .  triamcinolone cream (KENALOG) 0.1 %  Apply topically 2 (two) times daily.         Marland Kitchen  warfarin (COUMADIN) 5 MG tablet  TAKE AS DIRECTED BY ANTICOAGULATION CLINIC   50 tablet   3         Past Medical History     Diagnosis  Date   .  Coronary artery disease  05/2002       s/p acute inf, MI   .  Paroxysmal a-fib     .  Myocardial infarction         hx of   .  Hypertension     .  Hyperlipidemia     .  CHF (congestive heart failure)     .  Renal failure         end stage renal disease;  M,W,F DIALYSIS   .  Pericardial effusion     .  Cerebrovascular accident         believed to be embolic and now on chronic coumadin   .  GERD (gastroesophageal reflux disease)     .  Malignant neoplasm of kidney     .  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  Unspecified hearing loss     .  Diabetes mellitus         type 2   .  Anemia, iron deficiency         of chronic disease   .  Helicobacter pylori (H. pylori) infection         hx of   .  Esophagitis, reflux     .  Gout     .  Sleep apnea     .  Cholelithiasis     .  Hx of colonic polyps     .  Diverticulosis of colon     .  Streptococcal infection group D enterococcus     .  Chronic cough  onset 03/2010       Dr Melvyn Novas       Past Surgical History   Procedure  Date   .  Pci of rt coronary artery     .  Cholecystectomy  2009   .  Left  av graft         removal of   .  Right av fistula     .  Nasal septal         surgery   .  Tonsillectomy     .  Abdominal hysterectomy     .  Oophorectomy     .  Tubal ligation     .  Nephrectomy  2010       no CA on bx   .  Kidney transplant  03/16/11       History       Social History   .  Marital Status:  Married       Spouse Name:  N/A       Number of Children:  N/A   .  Years of Education:  N/A       Occupational History   .  retired         Social History Main Topics   .  Smoking status:  Former Smoker -- 1.0 packs/day for 30 years       Types:  Cigarettes       Quit date:  07/19/2001   .  Smokeless tobacco:  Not on file   .  Alcohol Use:  No   .  Drug Use:  No   .  Sexually Active:  Not Currently       Other Topics  Concern   .  Not on file       Social  History Narrative     Patient signed a Designated Party Release to allow her spouse [name] and family and five children to have access to her medical records/information.        ROS: no fevers or chills, productive cough, hemoptysis, dysphasia, odynophagia, melena, hematochezia, dysuria, hematuria, rash, seizure activity, orthopnea, PND, pedal edema, claudication. Remaining systems are negative.   Physical Exam: Well-developed well-nourished in no acute distress.   Skin is warm and dry.   HEENT is normal.   Neck is supple.   Chest is clear to auscultation with normal expansion.   Cardiovascular exam is irregular, 2/6 systolic murmur left sternal border. Abdominal exam nontender or distended. No masses palpated. Extremities show no edema. neuro grossly intact   ECG atrial fibrillation at a rate of 100. Axis normal. Cannot rule out prior septal infarct. Nonspecific ST changes.              Beebe, MD  05/25/2012  2:20 PM  Signed Will plan followup echocardiograms in the future. Small on most recent echo.  HYPERLIPIDEMIA - [name] Ruths, MD  05/25/2012  2:21 PM  Signed Continue statin.  CORONARY ARTERY DISEASE - [name] Ruths, MD  05/25/2012  2:21 PM  Signed Continue statin. Not on aspirin given need for Coumadin.  PAROXYSMAL ATRIAL FIBRILLATION - [name] Ruths, MD  05/25/2012  2:22 PM  Signed Patient has developed recurrent atrial fibrillation. Discontinue labetalol. Add metoprolol 25 mg by mouth twice a day for rate control. Continue Coumadin. Check TSH. Check INR today. If she is greater than 2 then we will proceed with elective cardioversion. Note she has been therapeutic for greater than 3 weeks if her INR is therapeutic today. If she develops recurrent atrial fibrillation in the future with increasing frequency she may require an antiarrhythmic.  CAROTID STENOSIS - [name] Ruths, MD  05/25/2012  2:23 PM  Signed Continue statin. Followup  carotid Dopplers August 2015.  AORTIC  STENOSIS - [name] Ruths, MD  05/25/2012  2:23 PM  Signed Plan followup echocardiogram may 2014. She will most likely require aortic valve replacement in the future.   For cardioversion; INR has been therapeutic [name]

## 2012-05-29 NOTE — Anesthesia Postprocedure Evaluation (Signed)
  Anesthesia Post-op Note  Patient: [name]  Procedure(s) Performed: Procedure(s) (LRB) with comments: CARDIOVERSION (N/A)  Patient Location: Endoscopy Unit  Anesthesia Type:General  Level of Consciousness: awake and alert   Airway and Oxygen Therapy: Patient Spontanous Breathing and Patient connected to nasal cannula oxygen  Post-op Pain: none  Post-op Assessment: Post-op Vital signs reviewed, Patient's Cardiovascular Status Stable, Respiratory Function Stable, Patent Airway and No signs of Nausea or vomiting  Post-op Vital Signs: Reviewed and stable  Complications: No apparent anesthesia complications

## 2012-05-29 NOTE — Anesthesia Preprocedure Evaluation (Signed)
Anesthesia Evaluation  Patient identified by MRN, date of birth, ID band Patient awake    Reviewed: Allergy & Precautions, H&P , NPO status , Patient's Chart, lab work & pertinent test results, reviewed documented beta blocker date and time   Airway Mallampati: I TM Distance: >3 FB     Dental  (+) Poor Dentition and Missing   Pulmonary sleep apnea ,          Cardiovascular hypertension, Pt. on medications and Pt. on home beta blockers + CAD, + Past MI, + Cardiac Stents and +CHF + dysrhythmias Atrial Fibrillation Rhythm:Irregular Rate:Normal     Neuro/Psych CVA, Residual Symptoms    GI/Hepatic GERD-  ,  Endo/Other  diabetes, Type 2, Oral Hypoglycemic Agents  Renal/GU Renal diseaseKidney transplant 8/12     Musculoskeletal   Abdominal   Peds  Hematology   Anesthesia Other Findings   Reproductive/Obstetrics                           Anesthesia Physical Anesthesia Plan  ASA: III  Anesthesia Plan: General   Post-op Pain Management:    Induction: Intravenous  Airway Management Planned: Mask  Additional Equipment:   Intra-op Plan:   Post-operative Plan:   Informed Consent: I have reviewed the patients History and Physical, chart, labs and discussed the procedure including the risks, benefits and alternatives for the proposed anesthesia with the patient or authorized representative who has indicated his/her understanding and acceptance.   Dental advisory given  Plan Discussed with: Anesthesiologist and Surgeon  Anesthesia Plan Comments:         Anesthesia Quick Evaluation

## 2012-05-29 NOTE — Transfer of Care (Signed)
Immediate Anesthesia Transfer of Care Note  Patient: [name]  Procedure(s) Performed: Procedure(s) (LRB) with comments: CARDIOVERSION (N/A)  Patient Location: Endoscopy Unit  Anesthesia Type:General  Level of Consciousness: awake and patient cooperative  Airway & Oxygen Therapy: Patient Spontanous Breathing and Patient connected to nasal cannula oxygen  Post-op Assessment: Report given to PACU RN and Post -op Vital signs reviewed and stable  Post vital signs: Reviewed and stable  Complications: No apparent anesthesia complications

## 2012-05-30 ENCOUNTER — Encounter (HOSPITAL_COMMUNITY): Payer: Self-pay | Admitting: Cardiology

## 2012-05-31 DIAGNOSIS — L290 Pruritus ani: Secondary | ICD-10-CM | POA: Diagnosis not present

## 2012-06-02 ENCOUNTER — Ambulatory Visit (INDEPENDENT_AMBULATORY_CARE_PROVIDER_SITE_OTHER): Payer: Medicare Other | Admitting: *Deleted

## 2012-06-02 DIAGNOSIS — I4891 Unspecified atrial fibrillation: Secondary | ICD-10-CM

## 2012-06-02 DIAGNOSIS — L290 Pruritus ani: Secondary | ICD-10-CM | POA: Diagnosis not present

## 2012-06-02 DIAGNOSIS — Z7901 Long term (current) use of anticoagulants: Secondary | ICD-10-CM

## 2012-06-19 ENCOUNTER — Encounter: Payer: Self-pay | Admitting: Cardiology

## 2012-06-19 ENCOUNTER — Ambulatory Visit (INDEPENDENT_AMBULATORY_CARE_PROVIDER_SITE_OTHER): Payer: Medicare Other | Admitting: *Deleted

## 2012-06-19 DIAGNOSIS — Z940 Kidney transplant status: Secondary | ICD-10-CM | POA: Diagnosis not present

## 2012-06-19 DIAGNOSIS — Z7901 Long term (current) use of anticoagulants: Secondary | ICD-10-CM | POA: Diagnosis not present

## 2012-06-19 DIAGNOSIS — E119 Type 2 diabetes mellitus without complications: Secondary | ICD-10-CM | POA: Diagnosis not present

## 2012-06-19 DIAGNOSIS — I4891 Unspecified atrial fibrillation: Secondary | ICD-10-CM

## 2012-06-19 DIAGNOSIS — I10 Essential (primary) hypertension: Secondary | ICD-10-CM | POA: Diagnosis not present

## 2012-06-19 DIAGNOSIS — N2581 Secondary hyperparathyroidism of renal origin: Secondary | ICD-10-CM | POA: Diagnosis not present

## 2012-06-19 DIAGNOSIS — D649 Anemia, unspecified: Secondary | ICD-10-CM | POA: Diagnosis not present

## 2012-06-19 DIAGNOSIS — Z79899 Other long term (current) drug therapy: Secondary | ICD-10-CM | POA: Diagnosis not present

## 2012-06-22 DIAGNOSIS — E119 Type 2 diabetes mellitus without complications: Secondary | ICD-10-CM | POA: Diagnosis not present

## 2012-06-22 DIAGNOSIS — Z940 Kidney transplant status: Secondary | ICD-10-CM | POA: Diagnosis not present

## 2012-06-22 DIAGNOSIS — I10 Essential (primary) hypertension: Secondary | ICD-10-CM | POA: Diagnosis not present

## 2012-06-22 DIAGNOSIS — R609 Edema, unspecified: Secondary | ICD-10-CM | POA: Diagnosis not present

## 2012-07-03 ENCOUNTER — Other Ambulatory Visit: Payer: Self-pay | Admitting: Internal Medicine

## 2012-07-03 DIAGNOSIS — Z1231 Encounter for screening mammogram for malignant neoplasm of breast: Secondary | ICD-10-CM

## 2012-07-06 DIAGNOSIS — Z940 Kidney transplant status: Secondary | ICD-10-CM | POA: Diagnosis not present

## 2012-07-10 ENCOUNTER — Ambulatory Visit (INDEPENDENT_AMBULATORY_CARE_PROVIDER_SITE_OTHER): Payer: Medicare Other | Admitting: *Deleted

## 2012-07-10 DIAGNOSIS — I4891 Unspecified atrial fibrillation: Secondary | ICD-10-CM

## 2012-07-10 DIAGNOSIS — Z7901 Long term (current) use of anticoagulants: Secondary | ICD-10-CM | POA: Diagnosis not present

## 2012-07-10 LAB — POCT INR: INR: 1.8

## 2012-07-25 ENCOUNTER — Ambulatory Visit (INDEPENDENT_AMBULATORY_CARE_PROVIDER_SITE_OTHER): Payer: Medicare Other

## 2012-07-25 ENCOUNTER — Ambulatory Visit (INDEPENDENT_AMBULATORY_CARE_PROVIDER_SITE_OTHER): Payer: Medicare Other | Admitting: Cardiology

## 2012-07-25 ENCOUNTER — Encounter: Payer: Self-pay | Admitting: Cardiology

## 2012-07-25 VITALS — BP 154/90 | HR 85 | Wt 178.0 lb

## 2012-07-25 DIAGNOSIS — D649 Anemia, unspecified: Secondary | ICD-10-CM | POA: Diagnosis not present

## 2012-07-25 DIAGNOSIS — N2581 Secondary hyperparathyroidism of renal origin: Secondary | ICD-10-CM | POA: Diagnosis not present

## 2012-07-25 DIAGNOSIS — I2510 Atherosclerotic heart disease of native coronary artery without angina pectoris: Secondary | ICD-10-CM | POA: Diagnosis not present

## 2012-07-25 DIAGNOSIS — Z7901 Long term (current) use of anticoagulants: Secondary | ICD-10-CM

## 2012-07-25 DIAGNOSIS — I10 Essential (primary) hypertension: Secondary | ICD-10-CM | POA: Diagnosis not present

## 2012-07-25 DIAGNOSIS — I4891 Unspecified atrial fibrillation: Secondary | ICD-10-CM | POA: Diagnosis not present

## 2012-07-25 DIAGNOSIS — E785 Hyperlipidemia, unspecified: Secondary | ICD-10-CM | POA: Diagnosis not present

## 2012-07-25 DIAGNOSIS — E119 Type 2 diabetes mellitus without complications: Secondary | ICD-10-CM | POA: Diagnosis not present

## 2012-07-25 DIAGNOSIS — I6529 Occlusion and stenosis of unspecified carotid artery: Secondary | ICD-10-CM

## 2012-07-25 DIAGNOSIS — I359 Nonrheumatic aortic valve disorder, unspecified: Secondary | ICD-10-CM

## 2012-07-25 DIAGNOSIS — Z940 Kidney transplant status: Secondary | ICD-10-CM | POA: Diagnosis not present

## 2012-07-25 DIAGNOSIS — I319 Disease of pericardium, unspecified: Secondary | ICD-10-CM

## 2012-07-25 DIAGNOSIS — Z79899 Other long term (current) drug therapy: Secondary | ICD-10-CM | POA: Diagnosis not present

## 2012-07-25 LAB — POCT INR: INR: 3.6

## 2012-07-25 NOTE — Progress Notes (Signed)
HPI: Pleasant female who presents for fu of aortic stenosis, pericardial effusion and atrial fibrillation. She has a history of coronary artery disease, status post PCI of the right coronary artery. Last LHC 5/05: Mid RCA 40%, EF 60%. 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 She was placed on Coumadin with a diagnosis of paroxysmal atrial fibrillation. Carotid Dopplers performed in August 2013 showed 40-59% left stenosis and 0-39% right; f/u recommended in 2 years. 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 Last echocardiogram in May of 2013 showed normal LV function, moderate LVH, moderate aortic stenosis with a mean gradient of 32 mm of mercury, mild left atrial enlargement and a small pericardial effusion. I last saw her in Nov 2013 and she was in recurrent atrial fibrillation. Had DCCV 11/13. She has dyspnea with more extreme activities but not routine activities. No orthopnea, PND, pedal edema, palpitations, syncope or chest pain.   Current Outpatient Prescriptions  Medication Sig Dispense Refill  . cinacalcet (SENSIPAR) 60 MG tablet Take 60 mg by mouth every other day.      . dapsone 25 MG tablet Take 25 mg by mouth daily.        . diphenhydrAMINE (BENADRYL) 50 MG capsule Take 1 capsule (50 mg total) by mouth every 6 (six) hours as needed for itching.  90 capsule  3  . esomeprazole (NEXIUM) 40 MG capsule Take 40 mg by mouth daily before breakfast.        . glipiZIDE (GLUCOTROL XL) 10 MG 24 hr tablet TAKE 1 TABLET BY MOUTH TWICE A DAY  60 tablet  5  . HYDROcodone-acetaminophen (VICODIN) 5-500 MG per tablet Take 1 tablet by mouth every 8 (eight) hours as needed for pain.  20 tablet  0  . metoprolol tartrate (LOPRESSOR) 25 MG tablet Take 1 tablet (25 mg total) by mouth 2 (two) times daily.  60 tablet  11  . mycophenolate (MYFORTIC) 180 MG EC tablet Take 360 mg by mouth 2 (two) times daily.          . naproxen (NAPROSYN) 500 MG tablet Take 1 tablet (500 mg total) by mouth 2 (two) times daily with a meal.  30 tablet  0  . polyethylene glycol (MIRALAX / GLYCOLAX) packet Take 17 g by mouth 2 (two) times daily. As needed       . pravastatin (PRAVACHOL) 40 MG tablet Take one tablet Mon, Wed, and Friday      . sennosides-docusate sodium (SENOKOT-S) 8.6-50 MG tablet Take 2 tablets by mouth as needed.       . sitaGLIPtin (JANUVIA) 100 MG tablet Take 100 mg by mouth daily.        . tacrolimus (PROGRAF) 1 MG capsule Take  7 Tabs in am and 6 in th pm      . triamcinolone cream (KENALOG) 0.1 % Apply topically 2 (two) times daily.      Marland Kitchen warfarin (COUMADIN) 5 MG tablet TAKE AS DIRECTED BY ANTICOAGULATION CLINIC  50 tablet  3     Past Medical History  Diagnosis Date  . Coronary artery disease 05/2002    s/p acute inf, MI  . Paroxysmal a-fib   . Myocardial infarction     hx of  . Hypertension   . Hyperlipidemia   . CHF (congestive heart failure)   . Renal failure     end stage renal disease;  M,W,F DIALYSIS  .  Pericardial effusion   . 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 GERD (gastroesophageal reflux disease)   . Malignant neoplasm of kidney   . 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 Unspecified hearing loss   . Diabetes mellitus     type 2  . Anemia, iron deficiency     of chronic disease  . Helicobacter pylori (H. pylori) infection     hx of  . Esophagitis, reflux   . Gout   . Sleep apnea   . Cholelithiasis   . Hx of colonic polyps   . Diverticulosis of colon   . Streptococcal infection group D enterococcus   . Chronic cough onset 03/2010    Dr Melvyn Novas    Past Surgical History  Procedure Date  . Pci of rt coronary artery   . Cholecystectomy 2009  . Left av graft     removal of  . Right av fistula   . Nasal septal     surgery  . Tonsillectomy   . Abdominal hysterectomy   . Oophorectomy   . Tubal ligation    . Nephrectomy 2010    no CA on bx  . Kidney transplant 03/16/11  . Cardioversion 05/29/2012    Procedure: CARDIOVERSION;  Surgeon: Lelon Perla, MD;  Location: [facility] ENDOSCOPY;  Service: Cardiovascular;  Laterality: N/A;    History   Social History  . Marital Status: Married    Spouse Name: N/A    Number of Children: N/A  . Years of Education: N/A   Occupational History  . retired    Social History Main Topics  . Smoking status: Former Smoker -- 1.0 packs/day for 30 years    Types: Cigarettes    Quit date: 07/19/2001  . Smokeless tobacco: Not on file  . Alcohol Use: No  . Drug Use: No  . Sexually Active: Not Currently   Other Topics Concern  . Not on file   Social History Narrative   Patient signed a Designated Party Release to allow her spouse [name] and family and five children to have access to her medical records/information.      ROS: no fevers or chills, productive cough, hemoptysis, dysphasia, odynophagia, melena, hematochezia, dysuria, hematuria, rash, seizure activity, orthopnea, PND, pedal edema, claudication. Remaining systems are negative.  Physical Exam: Well-developed well-nourished in no acute distress.  Skin is warm and dry.  HEENT is normal.  Neck is supple.  Chest is clear to auscultation with normal expansion.  Cardiovascular exam is irregular, 3/6 systolic murmur left sternal border. Murmur radiates to the carotids. Abdominal exam nontender or distended. No masses palpated. Extremities show no edema. neuro grossly intact  ECG in atrial fibrillation at a rate of 85. Axis normal. Cannot rule out prior septal infarct. Nonspecific T-wave changes.

## 2012-07-25 NOTE — Assessment & Plan Note (Signed)
Plan followup echocardiogram may 2014.

## 2012-07-25 NOTE — Assessment & Plan Note (Signed)
Continue statin. 

## 2012-07-25 NOTE — Assessment & Plan Note (Signed)
Continue statin. Followup carotid Dopplers August 2015.

## 2012-07-25 NOTE — Assessment & Plan Note (Signed)
Continue statin. Not on aspirin given need for Coumadin. 

## 2012-07-25 NOTE — Assessment & Plan Note (Signed)
Blood pressure mildly elevated. We will follow and increase medications as needed.

## 2012-07-25 NOTE — Patient Instructions (Addendum)
Your physician recommends that you schedule a follow-up appointment in: [place] has recommended that you wear a 24 HOUR holter monitor. Holter monitors are medical devices that record the heart's electrical activity. Doctors most often use these monitors to diagnose arrhythmias. Arrhythmias are problems with the speed or rhythm of the heartbeat. The monitor is a small, portable device. You can wear one while you do your normal daily activities. This is usually used to diagnose what is causing palpitations/syncope (passing out).

## 2012-07-25 NOTE — Assessment & Plan Note (Signed)
Repeat echocardiogram may 2014. She will require aortic valve replacement in the future.

## 2012-07-25 NOTE — Assessment & Plan Note (Signed)
Patient's atrial fibrillation has recurred. However she is not having significant symptoms. Continue Coumadin. We discussed options today including rate control versus rhythm control. Given that she is having no symptoms and will always require Coumadin as she has had a prior CVA we have elected rate control for now. I will see her back in 12 weeks. If she develops symptoms we will consider adding antiarrhythmic followed by cardioversion. She would not be a candidate for flecainide given history of coronary disease. I would also most likely not use sotalol or tikosyn given renal insufficiency. She will most likely need amiodarone. We would need to make sure there is no interaction with her transplant medications. Check 24-hour Holter monitor to make sure that rate is controlled. Continue beta blocker.

## 2012-07-26 DIAGNOSIS — Z940 Kidney transplant status: Secondary | ICD-10-CM | POA: Diagnosis not present

## 2012-07-26 DIAGNOSIS — I4891 Unspecified atrial fibrillation: Secondary | ICD-10-CM | POA: Diagnosis not present

## 2012-07-26 DIAGNOSIS — Z23 Encounter for immunization: Secondary | ICD-10-CM | POA: Diagnosis not present

## 2012-07-28 DIAGNOSIS — L290 Pruritus ani: Secondary | ICD-10-CM | POA: Diagnosis not present

## 2012-07-28 DIAGNOSIS — L299 Pruritus, unspecified: Secondary | ICD-10-CM | POA: Diagnosis not present

## 2012-07-31 ENCOUNTER — Telehealth: Payer: Self-pay | Admitting: *Deleted

## 2012-07-31 ENCOUNTER — Encounter (INDEPENDENT_AMBULATORY_CARE_PROVIDER_SITE_OTHER): Payer: Medicare Other

## 2012-07-31 DIAGNOSIS — I4891 Unspecified atrial fibrillation: Secondary | ICD-10-CM

## 2012-07-31 NOTE — Telephone Encounter (Signed)
24 hr holter monitor placed on pt 07/31/12 TK

## 2012-08-04 DIAGNOSIS — L290 Pruritus ani: Secondary | ICD-10-CM | POA: Diagnosis not present

## 2012-08-07 ENCOUNTER — Ambulatory Visit
Admission: RE | Admit: 2012-08-07 | Discharge: 2012-08-07 | Disposition: A | Discharge status: Home / Self Care | Payer: Medicare Other | Source: Ambulatory Visit | Attending: Internal Medicine | Admitting: Internal Medicine

## 2012-08-07 DIAGNOSIS — Z1231 Encounter for screening mammogram for malignant neoplasm of breast: Secondary | ICD-10-CM

## 2012-08-10 ENCOUNTER — Telehealth: Payer: Self-pay | Admitting: *Deleted

## 2012-08-10 NOTE — Telephone Encounter (Signed)
Spoke with pt, aware monitor reviewed by dr [name] shows atrial fib with controlled rate.

## 2012-08-14 ENCOUNTER — Other Ambulatory Visit: Payer: Self-pay | Admitting: Cardiology

## 2012-08-15 ENCOUNTER — Ambulatory Visit (INDEPENDENT_AMBULATORY_CARE_PROVIDER_SITE_OTHER): Payer: Medicare Other | Admitting: Pharmacist

## 2012-08-15 DIAGNOSIS — Z7901 Long term (current) use of anticoagulants: Secondary | ICD-10-CM

## 2012-08-15 DIAGNOSIS — I4891 Unspecified atrial fibrillation: Secondary | ICD-10-CM | POA: Diagnosis not present

## 2012-08-18 DIAGNOSIS — L290 Pruritus ani: Secondary | ICD-10-CM | POA: Diagnosis not present

## 2012-08-22 DIAGNOSIS — L290 Pruritus ani: Secondary | ICD-10-CM | POA: Diagnosis not present

## 2012-08-25 DIAGNOSIS — L290 Pruritus ani: Secondary | ICD-10-CM | POA: Diagnosis not present

## 2012-08-29 DIAGNOSIS — L290 Pruritus ani: Secondary | ICD-10-CM | POA: Diagnosis not present

## 2012-09-05 DIAGNOSIS — L820 Inflamed seborrheic keratosis: Secondary | ICD-10-CM | POA: Diagnosis not present

## 2012-09-05 DIAGNOSIS — L290 Pruritus ani: Secondary | ICD-10-CM | POA: Diagnosis not present

## 2012-09-05 DIAGNOSIS — L280 Lichen simplex chronicus: Secondary | ICD-10-CM | POA: Diagnosis not present

## 2012-09-08 DIAGNOSIS — L290 Pruritus ani: Secondary | ICD-10-CM | POA: Diagnosis not present

## 2012-09-12 ENCOUNTER — Ambulatory Visit (INDEPENDENT_AMBULATORY_CARE_PROVIDER_SITE_OTHER): Payer: Medicare Other

## 2012-09-12 DIAGNOSIS — Z7901 Long term (current) use of anticoagulants: Secondary | ICD-10-CM

## 2012-09-12 DIAGNOSIS — I4891 Unspecified atrial fibrillation: Secondary | ICD-10-CM | POA: Diagnosis not present

## 2012-09-12 DIAGNOSIS — L290 Pruritus ani: Secondary | ICD-10-CM | POA: Diagnosis not present

## 2012-09-14 ENCOUNTER — Encounter: Payer: Self-pay | Admitting: Cardiology

## 2012-09-14 DIAGNOSIS — D649 Anemia, unspecified: Secondary | ICD-10-CM | POA: Diagnosis not present

## 2012-09-14 DIAGNOSIS — Z79899 Other long term (current) drug therapy: Secondary | ICD-10-CM | POA: Diagnosis not present

## 2012-09-14 DIAGNOSIS — Z940 Kidney transplant status: Secondary | ICD-10-CM | POA: Diagnosis not present

## 2012-09-14 DIAGNOSIS — N2581 Secondary hyperparathyroidism of renal origin: Secondary | ICD-10-CM | POA: Diagnosis not present

## 2012-09-14 DIAGNOSIS — I10 Essential (primary) hypertension: Secondary | ICD-10-CM | POA: Diagnosis not present

## 2012-09-14 DIAGNOSIS — E119 Type 2 diabetes mellitus without complications: Secondary | ICD-10-CM | POA: Diagnosis not present

## 2012-09-19 DIAGNOSIS — L290 Pruritus ani: Secondary | ICD-10-CM | POA: Diagnosis not present

## 2012-09-20 DIAGNOSIS — I4891 Unspecified atrial fibrillation: Secondary | ICD-10-CM | POA: Diagnosis not present

## 2012-09-20 DIAGNOSIS — Z940 Kidney transplant status: Secondary | ICD-10-CM | POA: Diagnosis not present

## 2012-09-26 ENCOUNTER — Ambulatory Visit (INDEPENDENT_AMBULATORY_CARE_PROVIDER_SITE_OTHER): Payer: Medicare Other | Admitting: *Deleted

## 2012-09-26 DIAGNOSIS — Z7901 Long term (current) use of anticoagulants: Secondary | ICD-10-CM

## 2012-09-26 DIAGNOSIS — I4891 Unspecified atrial fibrillation: Secondary | ICD-10-CM | POA: Diagnosis not present

## 2012-09-29 DIAGNOSIS — L290 Pruritus ani: Secondary | ICD-10-CM | POA: Diagnosis not present

## 2012-10-05 DIAGNOSIS — L290 Pruritus ani: Secondary | ICD-10-CM | POA: Diagnosis not present

## 2012-10-09 DIAGNOSIS — L290 Pruritus ani: Secondary | ICD-10-CM | POA: Diagnosis not present

## 2012-10-10 ENCOUNTER — Ambulatory Visit (INDEPENDENT_AMBULATORY_CARE_PROVIDER_SITE_OTHER): Payer: Medicare Other | Admitting: Cardiology

## 2012-10-10 ENCOUNTER — Encounter: Payer: Self-pay | Admitting: Cardiology

## 2012-10-10 ENCOUNTER — Ambulatory Visit (INDEPENDENT_AMBULATORY_CARE_PROVIDER_SITE_OTHER): Payer: Medicare Other

## 2012-10-10 VITALS — BP 124/69 | HR 84 | Ht 62.0 in | Wt 177.2 lb

## 2012-10-10 DIAGNOSIS — I4891 Unspecified atrial fibrillation: Secondary | ICD-10-CM

## 2012-10-10 DIAGNOSIS — Z7901 Long term (current) use of anticoagulants: Secondary | ICD-10-CM | POA: Diagnosis not present

## 2012-10-10 DIAGNOSIS — I359 Nonrheumatic aortic valve disorder, unspecified: Secondary | ICD-10-CM

## 2012-10-10 NOTE — Assessment & Plan Note (Signed)
Repeat echocardiogram may 2014. She will most likely require aortic valve replacement in the near future.

## 2012-10-10 NOTE — Assessment & Plan Note (Signed)
Continue statin. Not on aspirin given need for Coumadin. 

## 2012-10-10 NOTE — Assessment & Plan Note (Signed)
followup echocardiogram may 2014.

## 2012-10-10 NOTE — Progress Notes (Signed)
HPI: Pleasant female who presents for fu of aortic stenosis, pericardial effusion and atrial fibrillation. She has a history of coronary artery disease, status post PCI of the right coronary artery. Last LHC 5/05: Mid RCA 40%, EF 60%. 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 She was placed on Coumadin with a diagnosis of paroxysmal atrial fibrillation. Carotid Dopplers performed in August 2013 showed 40-59% left stenosis and 0-39% right; f/u recommended in 2 years. 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 Last echocardiogram in May of 2013 showed normal LV function, moderate LVH, moderate aortic stenosis with a mean gradient of 32 mm of mercury, mild left atrial enlargement and a small pericardial effusion. Had DCCV 11/13. When I saw her back in January of 2014 atrial fibrillation had recurred. We elected rate control and anticoagulation. Followup monitor in January of 2014 showed her rate was controlled. She now returns for followup. Since I last saw her, she does have some dyspnea on exertion but she states actually improved. No orthopnea, PND, pedal edema, syncope or exertional chest pain.   Current Outpatient Prescriptions  Medication Sig Dispense Refill  . cinacalcet (SENSIPAR) 60 MG tablet Take 60 mg by mouth every other day.      . dapsone 25 MG tablet Take 25 mg by mouth daily.        . diphenhydrAMINE (BENADRYL) 50 MG capsule Take 1 capsule (50 mg total) by mouth every 6 (six) hours as needed for itching.  90 capsule  3  . glipiZIDE (GLUCOTROL XL) 10 MG 24 hr tablet TAKE 1 TABLET BY MOUTH TWICE A DAY  60 tablet  5  . HYDROcodone-acetaminophen (VICODIN) 5-500 MG per tablet Take 1 tablet by mouth every 8 (eight) hours as needed for pain.  20 tablet  0  . metoprolol tartrate (LOPRESSOR) 25 MG tablet Take 1 tablet (25 mg total) by mouth 2 (two) times daily.  60 tablet  11  . mycophenolate  (MYFORTIC) 180 MG EC tablet Take 360 mg by mouth 2 (two) times daily.        Marland Kitchen omeprazole (PRILOSEC) 40 MG capsule Take 40 mg by mouth daily.      . polyethylene glycol (MIRALAX / GLYCOLAX) packet Take 17 g by mouth 2 (two) times daily. As needed       . pravastatin (PRAVACHOL) 40 MG tablet Take one tablet Mon, Wed, and Friday      . sennosides-docusate sodium (SENOKOT-S) 8.6-50 MG tablet Take 2 tablets by mouth as needed.       . sitaGLIPtin (JANUVIA) 100 MG tablet Take 100 mg by mouth daily.        . tacrolimus (PROGRAF) 1 MG capsule Take  7 Tabs in am and 6 in th pm      . triamcinolone cream (KENALOG) 0.1 % Apply topically 2 (two) times daily.      Marland Kitchen warfarin (COUMADIN) 5 MG tablet TAKE AS DIRECTED BY ANTICOAGULATION CLINIC  50 tablet  3  . furosemide (LASIX) 20 MG tablet Take 1 tablet by mouth daily.       No current facility-administered medications for this visit.     Past Medical History  Diagnosis Date  . Coronary artery disease 05/2002    s/p acute inf, MI  . Paroxysmal a-fib   . Myocardial infarction     hx of  . Hypertension   . Hyperlipidemia   . CHF (  congestive heart failure)   . Renal failure     end stage renal disease;  M,W,F DIALYSIS  . Pericardial effusion   . 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 GERD (gastroesophageal reflux disease)   . Malignant neoplasm of kidney   . 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 Unspecified hearing loss   . Diabetes mellitus     type 2  . Anemia, iron deficiency     of chronic disease  . Helicobacter pylori (H. pylori) infection     hx of  . Esophagitis, reflux   . Gout   . Sleep apnea   . Cholelithiasis   . Hx of colonic polyps   . Diverticulosis of colon   . Streptococcal infection group D enterococcus   . Chronic cough onset 03/2010    Dr Melvyn Novas    Past Surgical History  Procedure Laterality Date  . Pci of rt coronary artery    . Cholecystectomy   2009  . Left av graft      removal of  . Right av fistula    . Nasal septal      surgery  . Tonsillectomy    . Abdominal hysterectomy    . Oophorectomy    . Tubal ligation    . Nephrectomy  2010    no CA on bx  . Kidney transplant  03/16/11  . Cardioversion  05/29/2012    Procedure: CARDIOVERSION;  Surgeon: Lelon Perla, MD;  Location: [facility] ENDOSCOPY;  Service: Cardiovascular;  Laterality: N/A;    History   Social History  . Marital Status: Married    Spouse Name: N/A    Number of Children: N/A  . Years of Education: N/A   Occupational History  . retired    Social History Main Topics  . Smoking status: Former Smoker -- 1.00 packs/day for 30 years    Types: Cigarettes    Quit date: 07/19/2001  . Smokeless tobacco: Not on file  . Alcohol Use: No  . Drug Use: No  . Sexually Active: Not Currently   Other Topics Concern  . Not on file   Social History Narrative   Patient signed a Designated Party Release to allow her spouse [name] and family and five children to have access to her medical records/information.      ROS: no fevers or chills, productive cough, hemoptysis, dysphasia, odynophagia, melena, hematochezia, dysuria, hematuria, rash, seizure activity, orthopnea, PND, pedal edema, claudication. Remaining systems are negative.  Physical Exam: Well-developed well-nourished in no acute distress.  Skin is warm and dry.  HEENT is normal.  Neck is supple.  Chest is clear to auscultation with normal expansion.  Cardiovascular exam is irregular, 3/6 systolic murmur left sternal border. Abdominal exam nontender or distended. No masses palpated. Extremities show no edema. neuro grossly intact

## 2012-10-10 NOTE — Assessment & Plan Note (Signed)
Patient remains in atrial fibrillation on examination. She states she actually feels better although she does have dyspnea on exertion. She will require Coumadin for life given history of embolic CVA. We will plan to continue with rate control for now. If she develops worsening symptoms in the future and we will consider cardioversion. She would need an antiarrhythmic. Given renal insufficiency this would most likely be amiodarone. I would need to discuss this with nephrology to check for interactions with her transplant medications.

## 2012-10-10 NOTE — Patient Instructions (Addendum)
Your physician has requested that you have an echocardiogram. Echocardiography is a painless test that uses sound waves to create images of your heart. It provides your doctor with information about the size and shape of your heart and how well your heart's chambers and valves are working. This procedure takes approximately one hour. There are no restrictions for this procedure. To be done in May.  Your physician recommends that you continue on your current medications as directed. Please refer to the Current Medication list given to you today.  Your physician recommends that you schedule a follow-up appointment in: June

## 2012-10-10 NOTE — Assessment & Plan Note (Signed)
Continue present blood pressure medications. 

## 2012-10-10 NOTE — Assessment & Plan Note (Signed)
Continue statin. 

## 2012-10-10 NOTE — Assessment & Plan Note (Signed)
Continue statin. Followup carotid Dopplers August 2015.

## 2012-10-19 ENCOUNTER — Other Ambulatory Visit: Payer: Self-pay | Admitting: Internal Medicine

## 2012-10-30 DIAGNOSIS — L280 Lichen simplex chronicus: Secondary | ICD-10-CM | POA: Diagnosis not present

## 2012-10-31 ENCOUNTER — Ambulatory Visit (INDEPENDENT_AMBULATORY_CARE_PROVIDER_SITE_OTHER): Payer: Medicare Other | Admitting: *Deleted

## 2012-10-31 DIAGNOSIS — I4891 Unspecified atrial fibrillation: Secondary | ICD-10-CM

## 2012-10-31 DIAGNOSIS — Z7901 Long term (current) use of anticoagulants: Secondary | ICD-10-CM | POA: Diagnosis not present

## 2012-10-31 LAB — POCT INR: INR: 3

## 2012-11-17 ENCOUNTER — Other Ambulatory Visit: Payer: Self-pay | Admitting: Internal Medicine

## 2012-11-28 ENCOUNTER — Ambulatory Visit (HOSPITAL_COMMUNITY): Payer: Medicare Other | Attending: Cardiology | Admitting: Radiology

## 2012-11-28 ENCOUNTER — Ambulatory Visit (INDEPENDENT_AMBULATORY_CARE_PROVIDER_SITE_OTHER): Payer: Medicare Other | Admitting: Pharmacist

## 2012-11-28 DIAGNOSIS — I080 Rheumatic disorders of both mitral and aortic valves: Secondary | ICD-10-CM | POA: Insufficient documentation

## 2012-11-28 DIAGNOSIS — Z8673 Personal history of transient ischemic attack (TIA), and cerebral infarction without residual deficits: Secondary | ICD-10-CM | POA: Insufficient documentation

## 2012-11-28 DIAGNOSIS — I379 Nonrheumatic pulmonary valve disorder, unspecified: Secondary | ICD-10-CM | POA: Insufficient documentation

## 2012-11-28 DIAGNOSIS — Z87891 Personal history of nicotine dependence: Secondary | ICD-10-CM | POA: Insufficient documentation

## 2012-11-28 DIAGNOSIS — I509 Heart failure, unspecified: Secondary | ICD-10-CM | POA: Insufficient documentation

## 2012-11-28 DIAGNOSIS — Z7901 Long term (current) use of anticoagulants: Secondary | ICD-10-CM | POA: Diagnosis not present

## 2012-11-28 DIAGNOSIS — I120 Hypertensive chronic kidney disease with stage 5 chronic kidney disease or end stage renal disease: Secondary | ICD-10-CM | POA: Diagnosis not present

## 2012-11-28 DIAGNOSIS — R5381 Other malaise: Secondary | ICD-10-CM | POA: Diagnosis not present

## 2012-11-28 DIAGNOSIS — I252 Old myocardial infarction: Secondary | ICD-10-CM | POA: Insufficient documentation

## 2012-11-28 DIAGNOSIS — I4891 Unspecified atrial fibrillation: Secondary | ICD-10-CM | POA: Insufficient documentation

## 2012-11-28 DIAGNOSIS — G4730 Sleep apnea, unspecified: Secondary | ICD-10-CM | POA: Insufficient documentation

## 2012-11-28 DIAGNOSIS — E785 Hyperlipidemia, unspecified: Secondary | ICD-10-CM | POA: Diagnosis not present

## 2012-11-28 DIAGNOSIS — N186 End stage renal disease: Secondary | ICD-10-CM | POA: Diagnosis not present

## 2012-11-28 DIAGNOSIS — E119 Type 2 diabetes mellitus without complications: Secondary | ICD-10-CM | POA: Insufficient documentation

## 2012-11-28 DIAGNOSIS — I079 Rheumatic tricuspid valve disease, unspecified: Secondary | ICD-10-CM | POA: Diagnosis not present

## 2012-11-28 DIAGNOSIS — I779 Disorder of arteries and arterioles, unspecified: Secondary | ICD-10-CM | POA: Insufficient documentation

## 2012-11-28 DIAGNOSIS — I2510 Atherosclerotic heart disease of native coronary artery without angina pectoris: Secondary | ICD-10-CM | POA: Diagnosis not present

## 2012-11-28 DIAGNOSIS — I359 Nonrheumatic aortic valve disorder, unspecified: Secondary | ICD-10-CM | POA: Diagnosis not present

## 2012-11-28 LAB — POCT INR: INR: 3

## 2012-11-28 NOTE — Progress Notes (Signed)
Echocardiogram performed.  

## 2012-12-12 ENCOUNTER — Other Ambulatory Visit: Payer: Self-pay | Admitting: Internal Medicine

## 2012-12-12 DIAGNOSIS — L280 Lichen simplex chronicus: Secondary | ICD-10-CM | POA: Diagnosis not present

## 2012-12-15 DIAGNOSIS — Z940 Kidney transplant status: Secondary | ICD-10-CM | POA: Diagnosis not present

## 2012-12-15 DIAGNOSIS — N2581 Secondary hyperparathyroidism of renal origin: Secondary | ICD-10-CM | POA: Diagnosis not present

## 2012-12-15 DIAGNOSIS — I10 Essential (primary) hypertension: Secondary | ICD-10-CM | POA: Diagnosis not present

## 2012-12-15 DIAGNOSIS — D649 Anemia, unspecified: Secondary | ICD-10-CM | POA: Diagnosis not present

## 2012-12-15 DIAGNOSIS — E119 Type 2 diabetes mellitus without complications: Secondary | ICD-10-CM | POA: Diagnosis not present

## 2012-12-15 DIAGNOSIS — Z79899 Other long term (current) drug therapy: Secondary | ICD-10-CM | POA: Diagnosis not present

## 2012-12-16 ENCOUNTER — Other Ambulatory Visit: Payer: Self-pay | Admitting: Cardiology

## 2012-12-20 DIAGNOSIS — Z940 Kidney transplant status: Secondary | ICD-10-CM | POA: Diagnosis not present

## 2012-12-20 DIAGNOSIS — I359 Nonrheumatic aortic valve disorder, unspecified: Secondary | ICD-10-CM | POA: Diagnosis not present

## 2012-12-20 DIAGNOSIS — I10 Essential (primary) hypertension: Secondary | ICD-10-CM | POA: Diagnosis not present

## 2012-12-26 ENCOUNTER — Ambulatory Visit (INDEPENDENT_AMBULATORY_CARE_PROVIDER_SITE_OTHER): Payer: Medicare Other | Admitting: *Deleted

## 2012-12-26 DIAGNOSIS — I4891 Unspecified atrial fibrillation: Secondary | ICD-10-CM | POA: Diagnosis not present

## 2012-12-26 DIAGNOSIS — Z7901 Long term (current) use of anticoagulants: Secondary | ICD-10-CM

## 2012-12-26 LAB — POCT INR: INR: 3.5

## 2013-01-04 ENCOUNTER — Encounter: Payer: Self-pay | Admitting: Cardiology

## 2013-01-04 ENCOUNTER — Ambulatory Visit (INDEPENDENT_AMBULATORY_CARE_PROVIDER_SITE_OTHER): Payer: Medicare Other | Admitting: Cardiology

## 2013-01-04 VITALS — BP 132/74 | HR 81 | Ht 62.5 in | Wt 182.1 lb

## 2013-01-04 DIAGNOSIS — I359 Nonrheumatic aortic valve disorder, unspecified: Secondary | ICD-10-CM

## 2013-01-04 DIAGNOSIS — I2510 Atherosclerotic heart disease of native coronary artery without angina pectoris: Secondary | ICD-10-CM | POA: Diagnosis not present

## 2013-01-04 DIAGNOSIS — E785 Hyperlipidemia, unspecified: Secondary | ICD-10-CM | POA: Diagnosis not present

## 2013-01-04 DIAGNOSIS — I10 Essential (primary) hypertension: Secondary | ICD-10-CM | POA: Diagnosis not present

## 2013-01-04 DIAGNOSIS — I4891 Unspecified atrial fibrillation: Secondary | ICD-10-CM

## 2013-01-04 NOTE — Assessment & Plan Note (Signed)
Patient remains in atrial fibrillation on examination. Her symptoms are unchanged. She will require Coumadin for life given history of embolic CVA. We will plan to continue with rate control for now. If she develops worsening symptoms in the future and we will consider cardioversion. She would need an antiarrhythmic. Given renal insufficiency this would most likely be amiodarone. I would need to discuss this with nephrology to check for interactions with her transplant medications.

## 2013-01-04 NOTE — Patient Instructions (Addendum)
Your physician wants you to follow-up in: 6 MONTHS WITH DR CRENSHAW You will receive a reminder letter in the mail two months in advance. If you don't receive a letter, please call our office to schedule the follow-up appointment.  

## 2013-01-04 NOTE — Assessment & Plan Note (Signed)
Patient now noted to have severe aortic stenosis. She does have some dyspnea on exertion but this is chronic and I do not believe is changed. She is not having chest pain or syncope. We will plan to follow her for now. Repeat echocardiogram May 2015. We will proceed with aortic valve replacement if her symptoms worsen. She may be a candidate for TAVR.

## 2013-01-04 NOTE — Assessment & Plan Note (Signed)
Continue present blood pressure medications. 

## 2013-01-04 NOTE — Assessment & Plan Note (Signed)
Continue statin. Followup carotid Dopplers August 2015.

## 2013-01-04 NOTE — Assessment & Plan Note (Signed)
Continue statin. 

## 2013-01-04 NOTE — Assessment & Plan Note (Signed)
Continue statin.not on aspirin given need for Coumadin.

## 2013-01-04 NOTE — Progress Notes (Signed)
HPI: Pleasant female who presents for fu of aortic stenosis, pericardial effusion and atrial fibrillation. She has a history of coronary artery disease, status post PCI of the right coronary artery. Last LHC 5/05: Mid RCA 40%, EF 60%. 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 She was placed on Coumadin with a diagnosis of paroxysmal atrial fibrillation. Carotid Dopplers performed in August 2013 showed 40-59% left stenosis and 0-39% right; f/u recommended in 2 years. When I saw her back in January of 2014 atrial fibrillation had recurred. We elected rate control and anticoagulation. Followup monitor in January of 2014 showed her rate was controlled. Last echocardiogram in may of 2014 showed normal LV function, moderate left ventricular hypertrophy, severe aortic stenosis with a mean gradient of 40 mm mercury, moderate biatrial enlargement. Since I last saw her, she notes some dyspnea on exertion but unchanged. Occasional pedal edema. No chest pain, palpitations or syncope.  Current Outpatient Prescriptions  Medication Sig Dispense Refill  . cinacalcet (SENSIPAR) 60 MG tablet Take 60 mg by mouth every other day.      . dapsone 25 MG tablet Take 25 mg by mouth daily.        . diphenhydrAMINE (BENADRYL) 50 MG capsule Take 1 capsule (50 mg total) by mouth every 6 (six) hours as needed for itching.  90 capsule  3  . Flurandrenolide 4 MCG/SQCM TAPE Apply topically daily.      . furosemide (LASIX) 20 MG tablet Take 1 tablet by mouth daily.      Marland Kitchen glipiZIDE (GLUCOTROL XL) 10 MG 24 hr tablet TAKE 1 TABLET BY MOUTH TWICE A DAY  60 tablet  5  . HYDROcodone-acetaminophen (VICODIN) 5-500 MG per tablet Take 1 tablet by mouth every 8 (eight) hours as needed for pain.  20 tablet  0  . metoprolol tartrate (LOPRESSOR) 25 MG tablet Take 1 tablet (25 mg total) by mouth 2 (two) times daily.  60 tablet  11  .  mycophenolate (MYFORTIC) 180 MG EC tablet Take 360 mg by mouth 2 (two) times daily.        Marland Kitchen nystatin (MYCOSTATIN) 100000 UNIT/ML suspension SWISH AND SPIT 5 ML AS DIRECTED  240 mL  0  . omeprazole (PRILOSEC) 40 MG capsule Take 40 mg by mouth daily.      . polyethylene glycol (MIRALAX / GLYCOLAX) packet Take 17 g by mouth 2 (two) times daily. As needed       . pravastatin (PRAVACHOL) 40 MG tablet Take one tablet Mon, Wed, and Friday      . sennosides-docusate sodium (SENOKOT-S) 8.6-50 MG tablet Take 2 tablets by mouth as needed.       . tacrolimus (PROGRAF) 1 MG capsule Take  7 Tabs in am and 6 in th pm      . triamcinolone cream (KENALOG) 0.1 % Apply topically 2 (two) times daily.      Marland Kitchen warfarin (COUMADIN) 5 MG tablet TAKE AS DIRECTED BY ANTICOAGULATION CLINIC  50 tablet  3   No current facility-administered medications for this visit.     Past Medical History  Diagnosis Date  . Coronary artery disease 05/2002    s/p acute inf, MI  . Paroxysmal a-fib   . Myocardial infarction     hx of  . Hypertension   . Hyperlipidemia   . CHF (congestive heart failure)   . Renal failure  end stage renal disease;  M,W,F DIALYSIS  . Pericardial effusion   . 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 GERD (gastroesophageal reflux disease)   . Malignant neoplasm of kidney   . 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 Unspecified hearing loss   . Diabetes mellitus     type 2  . Anemia, iron deficiency     of chronic disease  . Helicobacter pylori (H. pylori) infection     hx of  . Esophagitis, reflux   . Gout   . Sleep apnea   . Cholelithiasis   . Hx of colonic polyps   . Diverticulosis of colon   . Streptococcal infection group D enterococcus   . Chronic cough onset 03/2010    Dr Melvyn Novas    Past Surgical History  Procedure Laterality Date  . Pci of rt coronary artery    . Cholecystectomy  2009  . Left av graft      removal  of  . Right av fistula    . Nasal septal      surgery  . Tonsillectomy    . Abdominal hysterectomy    . Oophorectomy    . Tubal ligation    . Nephrectomy  2010    no CA on bx  . Kidney transplant  03/16/11  . Cardioversion  05/29/2012    Procedure: CARDIOVERSION;  Surgeon: Lelon Perla, MD;  Location: [facility] ENDOSCOPY;  Service: Cardiovascular;  Laterality: N/A;    History   Social History  . Marital Status: Married    Spouse Name: N/A    Number of Children: N/A  . Years of Education: N/A   Occupational History  . retired    Social History Main Topics  . Smoking status: Former Smoker -- 1.00 packs/day for 30 years    Types: Cigarettes    Quit date: 07/19/2001  . Smokeless tobacco: Not on file  . Alcohol Use: No  . Drug Use: No  . Sexually Active: Not Currently   Other Topics Concern  . Not on file   Social History Narrative   Patient signed a Designated Party Release to allow her spouse [name] and family and five children to have access to her medical records/information.      ROS: no fevers or chills, productive cough, hemoptysis, dysphasia, odynophagia, melena, hematochezia, dysuria, hematuria, rash, seizure activity, orthopnea, PND, pedal edema, claudication. Remaining systems are negative.  Physical Exam: Well-developed well-nourished in no acute distress.  Skin is warm and dry.  HEENT is normal.  Neck is supple.  Chest is clear to auscultation with normal expansion.  Cardiovascular exam is irregular, 3/6 systolic murmur left sternal border. Abdominal exam nontender or distended. No masses palpated. Extremities show trace edema. neuro grossly intact  ECG atrial fibrillation at a rate of 81. Lateral T-wave inversion.

## 2013-01-12 ENCOUNTER — Ambulatory Visit (INDEPENDENT_AMBULATORY_CARE_PROVIDER_SITE_OTHER): Payer: Medicare Other

## 2013-01-12 DIAGNOSIS — Z7901 Long term (current) use of anticoagulants: Secondary | ICD-10-CM | POA: Diagnosis not present

## 2013-01-12 DIAGNOSIS — I4891 Unspecified atrial fibrillation: Secondary | ICD-10-CM

## 2013-01-12 LAB — POCT INR: INR: 2.5

## 2013-02-06 DIAGNOSIS — H2510 Age-related nuclear cataract, unspecified eye: Secondary | ICD-10-CM | POA: Diagnosis not present

## 2013-02-09 ENCOUNTER — Ambulatory Visit (INDEPENDENT_AMBULATORY_CARE_PROVIDER_SITE_OTHER): Payer: Medicare Other | Admitting: *Deleted

## 2013-02-09 DIAGNOSIS — I4891 Unspecified atrial fibrillation: Secondary | ICD-10-CM

## 2013-02-09 DIAGNOSIS — Z7901 Long term (current) use of anticoagulants: Secondary | ICD-10-CM

## 2013-02-12 ENCOUNTER — Ambulatory Visit (INDEPENDENT_AMBULATORY_CARE_PROVIDER_SITE_OTHER): Payer: Medicare Other | Admitting: Cardiology

## 2013-02-12 ENCOUNTER — Encounter: Payer: Self-pay | Admitting: Cardiology

## 2013-02-12 VITALS — BP 154/88 | HR 80 | Wt 183.0 lb

## 2013-02-12 DIAGNOSIS — I2510 Atherosclerotic heart disease of native coronary artery without angina pectoris: Secondary | ICD-10-CM

## 2013-02-12 DIAGNOSIS — I359 Nonrheumatic aortic valve disorder, unspecified: Secondary | ICD-10-CM

## 2013-02-12 DIAGNOSIS — E785 Hyperlipidemia, unspecified: Secondary | ICD-10-CM

## 2013-02-12 DIAGNOSIS — I6350 Cerebral infarction due to unspecified occlusion or stenosis of unspecified cerebral artery: Secondary | ICD-10-CM | POA: Diagnosis not present

## 2013-02-12 DIAGNOSIS — I10 Essential (primary) hypertension: Secondary | ICD-10-CM

## 2013-02-12 DIAGNOSIS — I639 Cerebral infarction, unspecified: Secondary | ICD-10-CM

## 2013-02-12 NOTE — Assessment & Plan Note (Signed)
Continue statin. 

## 2013-02-12 NOTE — Progress Notes (Signed)
HPI: Pleasant female who presents for fu of aortic stenosis, pericardial effusion and atrial fibrillation. Patient has a history of embolic CVA. She is presently being treated with rate control and anticoagulation. She has a history of coronary artery disease, status post PCI of the right coronary artery. Last LHC 5/05: Mid RCA 40%, EF 60%. Her last Myoview 3/12: Anterior defect consistent with breast attenuation, EF 55%. Carotid Dopplers performed in August 2013 showed 40-59% left stenosis and 0-39% right; f/u recommended in 2 years. Previous monitor in January of 2014 showed her rate was controlled. Last echocardiogram in May of 2014 showed normal LV function, moderate left ventricular hypertrophy, severe aortic stenosis with a mean gradient of 40 mm mercury, moderate biatrial enlargement. Since I last saw her in June of 2014, she has noticed increased dyspnea on exertion.she has paroxysms of dyspnea for approximately one hour at a time. She denies orthopnea or pedal edema. No chest pain, palpitations or syncope.   Current Outpatient Prescriptions  Medication Sig Dispense Refill  . cinacalcet (SENSIPAR) 60 MG tablet Take 60 mg by mouth every other day.      . dapsone 25 MG tablet Take 25 mg by mouth daily.        . diphenhydrAMINE (BENADRYL) 50 MG capsule Take 1 capsule (50 mg total) by mouth every 6 (six) hours as needed for itching.  90 capsule  3  . Flurandrenolide 4 MCG/SQCM TAPE Apply topically daily.      . furosemide (LASIX) 20 MG tablet Take 1 tablet by mouth daily.      Marland Kitchen glipiZIDE (GLUCOTROL XL) 10 MG 24 hr tablet TAKE 1 TABLET BY MOUTH TWICE A DAY  60 tablet  5  . HYDROcodone-acetaminophen (VICODIN) 5-500 MG per tablet Take 1 tablet by mouth every 8 (eight) hours as needed for pain.  20 tablet  0  . metoprolol tartrate (LOPRESSOR) 25 MG tablet Take 1 tablet (25 mg total) by mouth 2 (two) times daily.  60 tablet  11  . mycophenolate (MYFORTIC) 180 MG EC tablet Take 360 mg by mouth 2 (two)  times daily.        Marland Kitchen nystatin (MYCOSTATIN) 100000 UNIT/ML suspension SWISH AND SPIT 5 ML AS DIRECTED  240 mL  0  . omeprazole (PRILOSEC) 40 MG capsule Take 40 mg by mouth daily.      . polyethylene glycol (MIRALAX / GLYCOLAX) packet Take 17 g by mouth 2 (two) times daily. As needed       . pravastatin (PRAVACHOL) 40 MG tablet Take one tablet Mon, Wed, and Friday      . sennosides-docusate sodium (SENOKOT-S) 8.6-50 MG tablet Take 2 tablets by mouth as needed.       . tacrolimus (PROGRAF) 1 MG capsule Take  7 Tabs in am and 6 in th pm      . triamcinolone cream (KENALOG) 0.1 % Apply topically 2 (two) times daily.      Marland Kitchen warfarin (COUMADIN) 5 MG tablet TAKE AS DIRECTED BY ANTICOAGULATION CLINIC  50 tablet  3   No current facility-administered medications for this visit.     Past Medical History  Diagnosis Date  . Coronary artery disease 05/2002    s/p acute inf, MI  . Paroxysmal a-fib   . Myocardial infarction     hx of  . Hypertension   . Hyperlipidemia   . CHF (congestive heart failure)   . Renal failure     end stage renal disease;  M,W,F DIALYSIS  .  Pericardial effusion   . 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 GERD (gastroesophageal reflux disease)   . Malignant neoplasm of kidney   . 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 Unspecified hearing loss   . Diabetes mellitus     type 2  . Anemia, iron deficiency     of chronic disease  . Helicobacter pylori (H. pylori) infection     hx of  . Esophagitis, reflux   . Gout   . Sleep apnea   . Cholelithiasis   . Hx of colonic polyps   . Diverticulosis of colon   . Streptococcal infection group D enterococcus   . Chronic cough onset 03/2010    Dr Melvyn Novas    Past Surgical History  Procedure Laterality Date  . Pci of rt coronary artery    . Cholecystectomy  2009  . Left av graft      removal of  . Right av fistula    . Nasal septal      surgery  .  Tonsillectomy    . Abdominal hysterectomy    . Oophorectomy    . Tubal ligation    . Nephrectomy  2010    no CA on bx  . Kidney transplant  03/16/11  . Cardioversion  05/29/2012    Procedure: CARDIOVERSION;  Surgeon: Lelon Perla, MD;  Location: [facility] ENDOSCOPY;  Service: Cardiovascular;  Laterality: N/A;    History   Social History  . Marital Status: Married    Spouse Name: N/A    Number of Children: N/A  . Years of Education: N/A   Occupational History  . retired    Social History Main Topics  . Smoking status: Former Smoker -- 1.00 packs/day for 30 years    Types: Cigarettes    Quit date: 07/19/2001  . Smokeless tobacco: Not on file  . Alcohol Use: No  . Drug Use: No  . Sexually Active: Not Currently   Other Topics Concern  . Not on file   Social History Narrative   Patient signed a Designated Party Release to allow her spouse [name] and family and five children to have access to her medical records/information.      ROS: no fevers or chills, productive cough, hemoptysis, dysphasia, odynophagia, melena, hematochezia, dysuria, hematuria, rash, seizure activity, orthopnea, PND, pedal edema, claudication. Remaining systems are negative.  Physical Exam: Well-developed well-nourished in no acute distress.  Skin is warm and dry.  HEENT is normal.  Neck is supple.  Chest is clear to auscultation with normal expansion.  Cardiovascular exam is irregular, 2/6 systolic murmur Abdominal exam nontender or distended. No masses palpated. Extremities show no edema. neuro grossly intact  ECG atrial fibrillation at a rate of 80. Left ventricular hypertrophy. Cannot rule out prior septal infarct. Nonspecific ST changes.

## 2013-02-12 NOTE — Assessment & Plan Note (Signed)
Continue present blood pressure medications. 

## 2013-02-12 NOTE — Assessment & Plan Note (Signed)
Continue statin. Not on aspirin given need for Coumadin. 

## 2013-02-12 NOTE — Patient Instructions (Addendum)
CALL TO DISCUSS HOSPITIZATION

## 2013-02-12 NOTE — Assessment & Plan Note (Signed)
Continue statin. Followup carotid Dopplers August 2015.

## 2013-02-12 NOTE — Assessment & Plan Note (Signed)
The patient presents with increasing dyspnea. She notices increased dyspnea with exertion and also bouts of sudden dyspnea. There may be contributions from her atrial fibrillation, aortic stenosis and also possible ischemia. We discussed the options today. Given increasing symptoms she will most likely require aortic valve replacement. She is very complicated given her history of atrial fibrillation, embolic CVA, prior renal transplant with renal insufficiency. I will plan to arrange elective admission. She will discontinue her Coumadin 2 days prior to being admitted and we will plan a Lovenox bridge. She will need to be hydrated prior to her procedure and I will also ask nephrology to evaluate while in the hospital. She may well need aortic valve replacement, maze procedure plus minus coronary artery bypassing graft pending findings on catheterization. Her Coumadin will need to be resumed following procedure and then she will need to see cardiothoracic surgery. She could also be considered for TAVR but I think she is most likely a surgical candidate. The risks of cardiac catheterization including stroke, myocardial infarction, death and renal insufficiency were discussed and the patient agrees to proceed.

## 2013-02-20 ENCOUNTER — Telehealth: Payer: Self-pay | Admitting: Cardiology

## 2013-02-20 NOTE — Telephone Encounter (Signed)
New problem   [name]/[name] need to know if pt need antibiotic before dental treatment and if they can use local anerethisia with or with out epinethrine. Please fax to [phone]

## 2013-02-20 NOTE — Telephone Encounter (Signed)
Judeen Hammans from [facility] calling to ask if patient needs pre-dental work ABX and if it is okay for Dentist to use epinephrine-based local anesthetic?  Called Sherry to verify procedure/s type. Patient is having peridontal scaling and possible molar extraction. Dr. [name] reviewed and stated that patient does not require pre-dental work ABX. Patient may have local anesthetic (epinephrine-based). Patient is NOT to stop Coumadin. Patient MUST STAY ON Coumadin.  Information faxed to [facility] at [facility] at [phone].

## 2013-02-23 DIAGNOSIS — D649 Anemia, unspecified: Secondary | ICD-10-CM | POA: Diagnosis not present

## 2013-02-23 DIAGNOSIS — Z940 Kidney transplant status: Secondary | ICD-10-CM | POA: Diagnosis not present

## 2013-02-23 DIAGNOSIS — H3560 Retinal hemorrhage, unspecified eye: Secondary | ICD-10-CM | POA: Diagnosis not present

## 2013-02-23 DIAGNOSIS — I10 Essential (primary) hypertension: Secondary | ICD-10-CM | POA: Diagnosis not present

## 2013-02-23 DIAGNOSIS — N2581 Secondary hyperparathyroidism of renal origin: Secondary | ICD-10-CM | POA: Diagnosis not present

## 2013-02-23 DIAGNOSIS — H3582 Retinal ischemia: Secondary | ICD-10-CM | POA: Diagnosis not present

## 2013-02-23 DIAGNOSIS — H34239 Retinal artery branch occlusion, unspecified eye: Secondary | ICD-10-CM | POA: Diagnosis not present

## 2013-02-23 DIAGNOSIS — Z79899 Other long term (current) drug therapy: Secondary | ICD-10-CM | POA: Diagnosis not present

## 2013-02-23 DIAGNOSIS — H35039 Hypertensive retinopathy, unspecified eye: Secondary | ICD-10-CM | POA: Diagnosis not present

## 2013-02-23 DIAGNOSIS — E119 Type 2 diabetes mellitus without complications: Secondary | ICD-10-CM | POA: Diagnosis not present

## 2013-03-02 ENCOUNTER — Ambulatory Visit (INDEPENDENT_AMBULATORY_CARE_PROVIDER_SITE_OTHER): Payer: Medicare Other | Admitting: *Deleted

## 2013-03-02 DIAGNOSIS — Z7901 Long term (current) use of anticoagulants: Secondary | ICD-10-CM | POA: Diagnosis not present

## 2013-03-02 DIAGNOSIS — I4891 Unspecified atrial fibrillation: Secondary | ICD-10-CM

## 2013-03-06 ENCOUNTER — Telehealth: Payer: Self-pay | Admitting: *Deleted

## 2013-03-06 NOTE — Telephone Encounter (Signed)
Spoke with pt, just wondering if she had decided about a cath and valve surgery. The pt reports after reading the pamphlet regarding heart surgery, it was recommended she get her dental work up to date. She has an appt for dental work 03-29-13. After her dental work is final she will call to get every thing set up. Dr [name] made aware.

## 2013-03-08 DIAGNOSIS — L259 Unspecified contact dermatitis, unspecified cause: Secondary | ICD-10-CM | POA: Diagnosis not present

## 2013-03-13 DIAGNOSIS — L299 Pruritus, unspecified: Secondary | ICD-10-CM | POA: Diagnosis not present

## 2013-03-13 DIAGNOSIS — I10 Essential (primary) hypertension: Secondary | ICD-10-CM | POA: Diagnosis not present

## 2013-03-13 DIAGNOSIS — Z940 Kidney transplant status: Secondary | ICD-10-CM | POA: Diagnosis not present

## 2013-03-13 DIAGNOSIS — Z48298 Encounter for aftercare following other organ transplant: Secondary | ICD-10-CM | POA: Diagnosis not present

## 2013-03-13 DIAGNOSIS — E1169 Type 2 diabetes mellitus with other specified complication: Secondary | ICD-10-CM | POA: Diagnosis not present

## 2013-03-13 DIAGNOSIS — E119 Type 2 diabetes mellitus without complications: Secondary | ICD-10-CM | POA: Diagnosis not present

## 2013-03-13 DIAGNOSIS — D899 Disorder involving the immune mechanism, unspecified: Secondary | ICD-10-CM | POA: Diagnosis not present

## 2013-03-20 DIAGNOSIS — Z940 Kidney transplant status: Secondary | ICD-10-CM | POA: Diagnosis not present

## 2013-03-20 DIAGNOSIS — R809 Proteinuria, unspecified: Secondary | ICD-10-CM | POA: Diagnosis not present

## 2013-03-22 DIAGNOSIS — D509 Iron deficiency anemia, unspecified: Secondary | ICD-10-CM | POA: Diagnosis not present

## 2013-03-22 DIAGNOSIS — Z940 Kidney transplant status: Secondary | ICD-10-CM | POA: Diagnosis not present

## 2013-03-22 DIAGNOSIS — E119 Type 2 diabetes mellitus without complications: Secondary | ICD-10-CM | POA: Diagnosis not present

## 2013-03-22 DIAGNOSIS — Z79899 Other long term (current) drug therapy: Secondary | ICD-10-CM | POA: Diagnosis not present

## 2013-03-23 ENCOUNTER — Telehealth: Payer: Self-pay | Admitting: Cardiology

## 2013-03-23 NOTE — Telephone Encounter (Signed)
New Problem  Pt wants a heart valve replacement scheduled after 03/29/2013// please call back

## 2013-03-23 NOTE — Telephone Encounter (Signed)
Will need to arrange elective admission. [name]

## 2013-03-23 NOTE — Telephone Encounter (Addendum)
Called patient back. She is ready to set up heart cath/valve surgery week of 9/15. Advised will send message to Dr.Crenshaw to see when he wants her admitted and issue of dosing of Lovenox.

## 2013-03-26 ENCOUNTER — Telehealth: Payer: Self-pay | Admitting: *Deleted

## 2013-03-26 DIAGNOSIS — Z48298 Encounter for aftercare following other organ transplant: Secondary | ICD-10-CM | POA: Diagnosis not present

## 2013-03-26 DIAGNOSIS — Z940 Kidney transplant status: Secondary | ICD-10-CM | POA: Diagnosis not present

## 2013-03-26 NOTE — Telephone Encounter (Signed)
Called patient to set up admission for IV hydration pre cath on 9/18 and right and left heart cath on 9/19 with Dr.Cooper. Patient is aware that she will receive a phone call from the admitting office at [facility] on 9/18 to give her a bed assignment.  She wanted Dr. [name] to know that she is allergic to Red food dye. Will forward message.  Has appointment with the Coumadin Clinic on 9/12 to monitor dose and bridge with Lovenox for impending admission.

## 2013-03-27 NOTE — Telephone Encounter (Signed)
Ok  [name]  

## 2013-03-28 ENCOUNTER — Encounter (HOSPITAL_COMMUNITY): Payer: Self-pay | Admitting: Pharmacy Technician

## 2013-03-30 ENCOUNTER — Encounter: Payer: Self-pay | Admitting: *Deleted

## 2013-03-30 ENCOUNTER — Ambulatory Visit (INDEPENDENT_AMBULATORY_CARE_PROVIDER_SITE_OTHER): Payer: Medicare Other

## 2013-03-30 DIAGNOSIS — Z7901 Long term (current) use of anticoagulants: Secondary | ICD-10-CM | POA: Diagnosis not present

## 2013-03-30 DIAGNOSIS — I4891 Unspecified atrial fibrillation: Secondary | ICD-10-CM | POA: Diagnosis not present

## 2013-03-30 DIAGNOSIS — Z01810 Encounter for preprocedural cardiovascular examination: Secondary | ICD-10-CM | POA: Diagnosis not present

## 2013-03-30 LAB — CBC
MCH: 26.8 pg (ref 26.0–34.0)
MCHC: 32.7 g/dL (ref 30.0–36.0)
MCV: 81.9 fL (ref 78.0–100.0)
Platelets: 274 10*3/uL (ref 150–400)
RBC: 5.04 MIL/uL (ref 3.87–5.11)
RDW: 15.3 % (ref 11.5–15.5)

## 2013-03-30 LAB — BASIC METABOLIC PANEL
BUN: 29 mg/dL — ABNORMAL HIGH (ref 6–23)
CO2: 27 mEq/L (ref 19–32)
Chloride: 104 mEq/L (ref 96–112)
Glucose, Bld: 140 mg/dL — ABNORMAL HIGH (ref 70–99)
Potassium: 4 mEq/L (ref 3.5–5.3)

## 2013-03-30 MED ORDER — ENOXAPARIN SODIUM 80 MG/0.8ML ~~LOC~~ SOLN: 80.0000 mg | Freq: Two times a day (BID) | SUBCUTANEOUS | Status: DC

## 2013-03-30 NOTE — Patient Instructions (Signed)
03/31/13 Take your last dosage of Coumadin. 04/01/13 No Coumadin, No Lovenox. 04/02/13 Start Lovenox 80mg  in the am, and Lovenox 80mg  in the pm. 04/03/13 Lovenox 80mg  in the am, and Lovenox 80mg  in the pm. 04/04/13 Lovenox 80mg  in the am, and Lovenox 80mg  in the pm. 04/05/13 Take your last dosage of Lovenox 80mg  in the am prior to admisson to the hospital on 04/05/13.

## 2013-04-05 ENCOUNTER — Inpatient Hospital Stay (HOSPITAL_COMMUNITY)
Admission: AD | Admit: 2013-04-05 | Discharge: 2013-04-23 | DRG: 216 | Disposition: A | Discharge status: Home Health | Payer: Medicare Other | Source: Ambulatory Visit | Attending: Thoracic Surgery (Cardiothoracic Vascular Surgery) | Admitting: Thoracic Surgery (Cardiothoracic Vascular Surgery)

## 2013-04-05 ENCOUNTER — Encounter (HOSPITAL_COMMUNITY): Payer: Self-pay | Admitting: Physician Assistant

## 2013-04-05 ENCOUNTER — Observation Stay (HOSPITAL_COMMUNITY): Payer: Medicare Other

## 2013-04-05 DIAGNOSIS — Z7901 Long term (current) use of anticoagulants: Secondary | ICD-10-CM

## 2013-04-05 DIAGNOSIS — Z9861 Coronary angioplasty status: Secondary | ICD-10-CM

## 2013-04-05 DIAGNOSIS — I369 Nonrheumatic tricuspid valve disorder, unspecified: Secondary | ICD-10-CM | POA: Diagnosis not present

## 2013-04-05 DIAGNOSIS — I519 Heart disease, unspecified: Secondary | ICD-10-CM | POA: Diagnosis not present

## 2013-04-05 DIAGNOSIS — I10 Essential (primary) hypertension: Secondary | ICD-10-CM | POA: Diagnosis not present

## 2013-04-05 DIAGNOSIS — I359 Nonrheumatic aortic valve disorder, unspecified: Principal | ICD-10-CM

## 2013-04-05 DIAGNOSIS — J9819 Other pulmonary collapse: Secondary | ICD-10-CM | POA: Diagnosis not present

## 2013-04-05 DIAGNOSIS — R109 Unspecified abdominal pain: Secondary | ICD-10-CM | POA: Diagnosis not present

## 2013-04-05 DIAGNOSIS — Z87891 Personal history of nicotine dependence: Secondary | ICD-10-CM

## 2013-04-05 DIAGNOSIS — E1129 Type 2 diabetes mellitus with other diabetic kidney complication: Secondary | ICD-10-CM | POA: Diagnosis present

## 2013-04-05 DIAGNOSIS — Y9210 Unspecified residential institution as the place of occurrence of the external cause: Secondary | ICD-10-CM | POA: Diagnosis not present

## 2013-04-05 DIAGNOSIS — Z01818 Encounter for other preprocedural examination: Secondary | ICD-10-CM | POA: Diagnosis not present

## 2013-04-05 DIAGNOSIS — I871 Compression of vein: Secondary | ICD-10-CM | POA: Diagnosis not present

## 2013-04-05 DIAGNOSIS — I700 Atherosclerosis of aorta: Secondary | ICD-10-CM | POA: Diagnosis present

## 2013-04-05 DIAGNOSIS — N179 Acute kidney failure, unspecified: Secondary | ICD-10-CM | POA: Diagnosis not present

## 2013-04-05 DIAGNOSIS — S15009A Unspecified injury of unspecified carotid artery, initial encounter: Secondary | ICD-10-CM | POA: Diagnosis not present

## 2013-04-05 DIAGNOSIS — N2581 Secondary hyperparathyroidism of renal origin: Secondary | ICD-10-CM | POA: Diagnosis present

## 2013-04-05 DIAGNOSIS — I4891 Unspecified atrial fibrillation: Secondary | ICD-10-CM

## 2013-04-05 DIAGNOSIS — J988 Other specified respiratory disorders: Secondary | ICD-10-CM | POA: Diagnosis not present

## 2013-04-05 DIAGNOSIS — K219 Gastro-esophageal reflux disease without esophagitis: Secondary | ICD-10-CM | POA: Diagnosis present

## 2013-04-05 DIAGNOSIS — E876 Hypokalemia: Secondary | ICD-10-CM | POA: Diagnosis not present

## 2013-04-05 DIAGNOSIS — I129 Hypertensive chronic kidney disease with stage 1 through stage 4 chronic kidney disease, or unspecified chronic kidney disease: Secondary | ICD-10-CM | POA: Diagnosis present

## 2013-04-05 DIAGNOSIS — I2510 Atherosclerotic heart disease of native coronary artery without angina pectoris: Secondary | ICD-10-CM | POA: Diagnosis not present

## 2013-04-05 DIAGNOSIS — I69920 Aphasia following unspecified cerebrovascular disease: Secondary | ICD-10-CM

## 2013-04-05 DIAGNOSIS — Z953 Presence of xenogenic heart valve: Secondary | ICD-10-CM

## 2013-04-05 DIAGNOSIS — I509 Heart failure, unspecified: Secondary | ICD-10-CM | POA: Diagnosis not present

## 2013-04-05 DIAGNOSIS — E669 Obesity, unspecified: Secondary | ICD-10-CM | POA: Diagnosis present

## 2013-04-05 DIAGNOSIS — I5033 Acute on chronic diastolic (congestive) heart failure: Secondary | ICD-10-CM | POA: Diagnosis not present

## 2013-04-05 DIAGNOSIS — A419 Sepsis, unspecified organism: Secondary | ICD-10-CM | POA: Diagnosis not present

## 2013-04-05 DIAGNOSIS — J90 Pleural effusion, not elsewhere classified: Secondary | ICD-10-CM | POA: Diagnosis not present

## 2013-04-05 DIAGNOSIS — Z952 Presence of prosthetic heart valve: Secondary | ICD-10-CM | POA: Diagnosis not present

## 2013-04-05 DIAGNOSIS — J95821 Acute postprocedural respiratory failure: Secondary | ICD-10-CM | POA: Diagnosis not present

## 2013-04-05 DIAGNOSIS — I6350 Cerebral infarction due to unspecified occlusion or stenosis of unspecified cerebral artery: Secondary | ICD-10-CM | POA: Diagnosis present

## 2013-04-05 DIAGNOSIS — D72829 Elevated white blood cell count, unspecified: Secondary | ICD-10-CM | POA: Diagnosis not present

## 2013-04-05 DIAGNOSIS — Z79899 Other long term (current) drug therapy: Secondary | ICD-10-CM

## 2013-04-05 DIAGNOSIS — I2582 Chronic total occlusion of coronary artery: Secondary | ICD-10-CM | POA: Diagnosis present

## 2013-04-05 DIAGNOSIS — N183 Chronic kidney disease, stage 3 unspecified: Secondary | ICD-10-CM | POA: Diagnosis present

## 2013-04-05 DIAGNOSIS — D62 Acute posthemorrhagic anemia: Secondary | ICD-10-CM | POA: Diagnosis not present

## 2013-04-05 DIAGNOSIS — R9389 Abnormal findings on diagnostic imaging of other specified body structures: Secondary | ICD-10-CM | POA: Diagnosis not present

## 2013-04-05 DIAGNOSIS — Z8679 Personal history of other diseases of the circulatory system: Secondary | ICD-10-CM

## 2013-04-05 DIAGNOSIS — I079 Rheumatic tricuspid valve disease, unspecified: Secondary | ICD-10-CM | POA: Diagnosis present

## 2013-04-05 DIAGNOSIS — I059 Rheumatic mitral valve disease, unspecified: Secondary | ICD-10-CM | POA: Diagnosis present

## 2013-04-05 DIAGNOSIS — H9190 Unspecified hearing loss, unspecified ear: Secondary | ICD-10-CM | POA: Diagnosis present

## 2013-04-05 DIAGNOSIS — Z23 Encounter for immunization: Secondary | ICD-10-CM

## 2013-04-05 DIAGNOSIS — E785 Hyperlipidemia, unspecified: Secondary | ICD-10-CM | POA: Diagnosis present

## 2013-04-05 DIAGNOSIS — R0989 Other specified symptoms and signs involving the circulatory and respiratory systems: Secondary | ICD-10-CM | POA: Diagnosis not present

## 2013-04-05 DIAGNOSIS — Y658 Other specified misadventures during surgical and medical care: Secondary | ICD-10-CM | POA: Diagnosis not present

## 2013-04-05 DIAGNOSIS — N19 Unspecified kidney failure: Secondary | ICD-10-CM | POA: Diagnosis not present

## 2013-04-05 DIAGNOSIS — J189 Pneumonia, unspecified organism: Secondary | ICD-10-CM | POA: Diagnosis not present

## 2013-04-05 DIAGNOSIS — R1310 Dysphagia, unspecified: Secondary | ICD-10-CM | POA: Diagnosis not present

## 2013-04-05 DIAGNOSIS — N186 End stage renal disease: Secondary | ICD-10-CM | POA: Diagnosis not present

## 2013-04-05 DIAGNOSIS — I2789 Other specified pulmonary heart diseases: Secondary | ICD-10-CM | POA: Diagnosis present

## 2013-04-05 DIAGNOSIS — I319 Disease of pericardium, unspecified: Secondary | ICD-10-CM | POA: Diagnosis not present

## 2013-04-05 DIAGNOSIS — IMO0002 Reserved for concepts with insufficient information to code with codable children: Secondary | ICD-10-CM | POA: Diagnosis not present

## 2013-04-05 DIAGNOSIS — I480 Paroxysmal atrial fibrillation: Secondary | ICD-10-CM | POA: Diagnosis present

## 2013-04-05 DIAGNOSIS — R079 Chest pain, unspecified: Secondary | ICD-10-CM | POA: Diagnosis not present

## 2013-04-05 DIAGNOSIS — R5381 Other malaise: Secondary | ICD-10-CM | POA: Diagnosis not present

## 2013-04-05 DIAGNOSIS — N189 Chronic kidney disease, unspecified: Secondary | ICD-10-CM | POA: Diagnosis not present

## 2013-04-05 DIAGNOSIS — G4730 Sleep apnea, unspecified: Secondary | ICD-10-CM | POA: Diagnosis present

## 2013-04-05 DIAGNOSIS — E1169 Type 2 diabetes mellitus with other specified complication: Secondary | ICD-10-CM | POA: Diagnosis not present

## 2013-04-05 DIAGNOSIS — I252 Old myocardial infarction: Secondary | ICD-10-CM

## 2013-04-05 DIAGNOSIS — E1122 Type 2 diabetes mellitus with diabetic chronic kidney disease: Secondary | ICD-10-CM | POA: Diagnosis present

## 2013-04-05 DIAGNOSIS — J811 Chronic pulmonary edema: Secondary | ICD-10-CM | POA: Diagnosis not present

## 2013-04-05 DIAGNOSIS — Z940 Kidney transplant status: Secondary | ICD-10-CM

## 2013-04-05 DIAGNOSIS — Z01810 Encounter for preprocedural cardiovascular examination: Secondary | ICD-10-CM | POA: Diagnosis not present

## 2013-04-05 DIAGNOSIS — I6529 Occlusion and stenosis of unspecified carotid artery: Secondary | ICD-10-CM | POA: Diagnosis present

## 2013-04-05 DIAGNOSIS — N185 Chronic kidney disease, stage 5: Secondary | ICD-10-CM | POA: Diagnosis not present

## 2013-04-05 HISTORY — DX: Type 2 diabetes mellitus without complications: E11.9

## 2013-04-05 HISTORY — DX: Peripheral vascular disease, unspecified: I73.9

## 2013-04-05 HISTORY — DX: Other specified postprocedural states: Z98.890

## 2013-04-05 HISTORY — DX: Nonrheumatic aortic (valve) stenosis: I35.0

## 2013-04-05 HISTORY — DX: Disorder of arteries and arterioles, unspecified: I77.9

## 2013-04-05 HISTORY — DX: Personal history of other diseases of the circulatory system: Z86.79

## 2013-04-05 HISTORY — DX: Unspecified atrial fibrillation: I48.91

## 2013-04-05 HISTORY — DX: End stage renal disease: N18.6

## 2013-04-05 HISTORY — DX: Pruritus, unspecified: L29.9

## 2013-04-05 HISTORY — DX: Cardiac arrest, cause unspecified: I46.9

## 2013-04-05 HISTORY — DX: Presence of xenogenic heart valve: Z95.3

## 2013-04-05 LAB — CBC
MCV: 84 fL (ref 78.0–100.0)
Platelets: 232 10*3/uL (ref 150–400)
RDW: 15.2 % (ref 11.5–15.5)
WBC: 6 10*3/uL (ref 4.0–10.5)

## 2013-04-05 LAB — GLUCOSE, CAPILLARY: Glucose-Capillary: 131 mg/dL — ABNORMAL HIGH (ref 70–99)

## 2013-04-05 LAB — BASIC METABOLIC PANEL
CO2: 24 mEq/L (ref 19–32)
Calcium: 9.7 mg/dL (ref 8.4–10.5)
Creatinine, Ser: 1.4 mg/dL — ABNORMAL HIGH (ref 0.50–1.10)
GFR calc non Af Amer: 37 mL/min — ABNORMAL LOW (ref >90)

## 2013-04-05 MED ORDER — ACETAMINOPHEN 325 MG PO TABS
650.0000 mg | ORAL_TABLET | ORAL | Status: DC | PRN
  Administered 2013-04-08 – 2013-04-11 (×2): 650 mg via ORAL
  Filled 2013-04-05 (×2): qty 2

## 2013-04-05 MED ORDER — NON FORMULARY: 1.0000 | Status: DC

## 2013-04-05 MED ORDER — PRAVASTATIN SODIUM 40 MG PO TABS
40.0000 mg | ORAL_TABLET | ORAL | Status: DC
  Filled 2013-04-05 (×2): qty 1

## 2013-04-05 MED ORDER — CINACALCET HCL 30 MG PO TABS
60.0000 mg | ORAL_TABLET | ORAL | Status: DC
  Administered 2013-04-07 – 2013-04-09 (×2): 60 mg via ORAL
  Filled 2013-04-05 (×4): qty 2

## 2013-04-05 MED ORDER — INFLUENZA VAC SPLIT QUAD 0.5 ML IM SUSP
0.5000 mL | INTRAMUSCULAR | Status: AC
  Filled 2013-04-05: qty 0.5

## 2013-04-05 MED ORDER — FLURANDRENOLIDE 4 MCG/SQCM EX TAPE: 1.0000 | MEDICATED_TAPE | Freq: Every day | CUTANEOUS | Status: DC | PRN

## 2013-04-05 MED ORDER — SODIUM CHLORIDE 0.9 % IV SOLN
INTRAVENOUS | Status: DC
  Administered 2013-04-05 – 2013-04-06 (×2): via INTRAVENOUS

## 2013-04-05 MED ORDER — INSULIN ASPART 100 UNIT/ML ~~LOC~~ SOLN
0.0000 [IU] | Freq: Three times a day (TID) | SUBCUTANEOUS | Status: DC
  Administered 2013-04-05: 1 [IU] via SUBCUTANEOUS
  Administered 2013-04-06: 3 [IU] via SUBCUTANEOUS
  Administered 2013-04-07: 1 [IU] via SUBCUTANEOUS
  Administered 2013-04-07: 3 [IU] via SUBCUTANEOUS
  Administered 2013-04-08: 2 [IU] via SUBCUTANEOUS
  Administered 2013-04-08: 1 [IU] via SUBCUTANEOUS
  Administered 2013-04-09 – 2013-04-11 (×4): 2 [IU] via SUBCUTANEOUS

## 2013-04-05 MED ORDER — METOPROLOL TARTRATE 25 MG PO TABS
25.0000 mg | ORAL_TABLET | Freq: Two times a day (BID) | ORAL | Status: DC
  Administered 2013-04-05 – 2013-04-06 (×2): 25 mg via ORAL
  Filled 2013-04-05 (×5): qty 1

## 2013-04-05 MED ORDER — NITROGLYCERIN 0.4 MG SL SUBL: 0.4000 mg | SUBLINGUAL_TABLET | SUBLINGUAL | Status: DC | PRN

## 2013-04-05 MED ORDER — MAGNESIUM OXIDE 400 (241.3 MG) MG PO TABS
800.0000 mg | ORAL_TABLET | Freq: Every day | ORAL | Status: DC
  Administered 2013-04-07 – 2013-04-10 (×4): 800 mg via ORAL
  Filled 2013-04-05 (×7): qty 2

## 2013-04-05 MED ORDER — HEPARIN (PORCINE) IN NACL 100-0.45 UNIT/ML-% IJ SOLN
600.0000 [IU]/h | INTRAMUSCULAR | Status: DC
  Administered 2013-04-05: 850 [IU]/h via INTRAVENOUS
  Administered 2013-04-06: 600 [IU]/h via INTRAVENOUS
  Filled 2013-04-05: qty 250

## 2013-04-05 MED ORDER — TACROLIMUS 1 MG PO CAPS
5.0000 mg | ORAL_CAPSULE | Freq: Two times a day (BID) | ORAL | Status: DC
  Administered 2013-04-05 – 2013-04-07 (×4): 5 mg via ORAL
  Filled 2013-04-05 (×6): qty 5

## 2013-04-05 MED ORDER — GLIPIZIDE ER 10 MG PO TB24
10.0000 mg | ORAL_TABLET | Freq: Two times a day (BID) | ORAL | Status: DC
  Administered 2013-04-05 – 2013-04-10 (×9): 10 mg via ORAL
  Filled 2013-04-05 (×15): qty 1

## 2013-04-05 MED ORDER — DIPHENHYDRAMINE HCL 25 MG PO CAPS: 50.0000 mg | ORAL_CAPSULE | Freq: Four times a day (QID) | ORAL | Status: DC | PRN

## 2013-04-05 MED ORDER — DAPSONE 25 MG PO TABS
25.0000 mg | ORAL_TABLET | Freq: Every day | ORAL | Status: DC
  Administered 2013-04-06 – 2013-04-10 (×5): 25 mg via ORAL
  Filled 2013-04-05 (×7): qty 1

## 2013-04-05 MED ORDER — ONDANSETRON HCL 4 MG/2ML IJ SOLN: 4.0000 mg | Freq: Four times a day (QID) | INTRAMUSCULAR | Status: DC | PRN

## 2013-04-05 MED ORDER — TRIAMCINOLONE ACETONIDE 0.1 % EX CREA
1.0000 | TOPICAL_CREAM | Freq: Two times a day (BID) | CUTANEOUS | Status: DC
Start: 2013-04-05 — End: 2013-04-12
  Administered 2013-04-05 – 2013-04-10 (×12): 1 via TOPICAL
  Filled 2013-04-05 (×7): qty 15

## 2013-04-05 MED ORDER — MYCOPHENOLATE SODIUM 180 MG PO TBEC
360.0000 mg | DELAYED_RELEASE_TABLET | Freq: Two times a day (BID) | ORAL | Status: DC
  Administered 2013-04-05 – 2013-04-10 (×11): 360 mg via ORAL
  Filled 2013-04-05 (×18): qty 2

## 2013-04-05 NOTE — Consult Note (Signed)
Referring Provider: No ref. provider found Primary Care Physician:  Walker Kehr, MD Primary Nephrologist:  Dr. Baird Cancer  Reason for Consultation: Known renal transplant 2012 [facility]   HPI: 67 y/o F with history of CAD s/p MI/RCA stent 2005, history of aortic stenosis and Dm. Known history of atrial fibrillation and anticoagulated with coumadin. Planned admission for cardiac catheterization. 2D echo from 11/2012 was consistent with severe AS.    Past Medical History  Diagnosis Date  . Coronary artery disease 05/2002    a. Ant MI 2003 s/p PTCA/stent to RCA.   . Paroxysmal a-fib   . Hypertension   . Hyperlipidemia   . CHF (congestive heart failure)   . Pericardial effusion     a. Small by echo 11/2011.  Marland Kitchen GERD (gastroesophageal reflux disease)   . 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 Unspecified hearing loss   . Anemia, iron deficiency     of chronic disease  . Helicobacter pylori (H. pylori) infection     hx of  . Esophagitis, reflux   . Gout   . Cholelithiasis   . Hx of colonic polyps   . Diverticulosis of colon   . 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  Marland Kitchen Chronic cough onset 03/2010    Dr Melvyn Novas  . Carotid artery disease     a. Carotid Dopplers performed in August 2013 showed 40-59% left stenosis and 0-39% right; f/u recommended in 2 years.   . Aortic stenosis     a. Severe AS by echo 11/2012.  . Asystole     a. During ENT surgery 2005: developed marked asystole requiring CPR, felt due to vagal reaction (cath nonobst dz).  . Myocardial infarction 2003  . Cerebrovascular accident 2009    a. LMCA infarct felt embolic [id number], maintained on chronic coumadin.; denies residual on 04/05/2013  . Sleep apnea     Pt says testing was positive, intolerant of CPAP.  Marland Kitchen Type II diabetes mellitus   . ESRD (end  stage renal disease)     a. Mass on L kidney per pt s/p nephrectomy - pt states not cancer - [facility] notes indicate ESRD due to HTN/DM - was previously on HD. b. Kidney transplant 02/2011.    Past Surgical History  Procedure Laterality Date  . Cholecystectomy  2009  . Arteriovenous graft placement Left   . Av fistula placement Right   . Nasal reconstruction with septal repair      "took it out" (04/05/2013)  . Tubal ligation    . Nephrectomy Left 2010    no CA on bx  . Kidney transplant  03/16/11  . Cardioversion  05/29/2012    Procedure: CARDIOVERSION;  Surgeon: Lelon Perla, MD;  Location: [facility] ENDOSCOPY;  Service: Cardiovascular;  Laterality: N/A;  . Coronary angioplasty with stent placement Right     coronary artery  . Tonsillectomy    . Total abdominal hysterectomy    . Arteriovenous graft placement Left     "I've had 2 on my left; had one removed" (04/05/2013)   . Av fistula repair Right     "took it out" ((/18/2014)  . Insertion of dialysis catheter Bilateral     "over the years; took them both out" (04/05/2013)    Prior to Admission medications   Medication Sig Start Date End Date Taking?  Authorizing Provider  cinacalcet (SENSIPAR) 60 MG tablet Take 60 mg by mouth every other day.   Yes Historical Provider, MD  dapsone 25 MG tablet Take 25 mg by mouth daily.    Yes Historical Provider, MD  diphenhydrAMINE (BENADRYL) 50 MG capsule Take 1 capsule (50 mg total) by mouth every 6 (six) hours as needed for itching. 08/31/11  Yes [name] Plotnikov, MD  enoxaparin (LOVENOX) 80 MG/0.8ML injection Inject 0.8 mLs (80 mg total) into the skin every 12 (twelve) hours. 03/30/13  Yes Lelon Perla, MD  Flurandrenolide 4 MCG/SQCM TAPE Apply topically daily.   Yes Historical Provider, MD  furosemide (LASIX) 20 MG tablet Take 1 tablet by mouth daily. 09/18/12  Yes Historical Provider, MD  glipiZIDE (GLUCOTROL XL) 10 MG 24 hr tablet Take 10 mg by mouth 2 (two) times daily.   Yes Historical  Provider, MD  Magnesium 400 MG CAPS Take 800 mg by mouth daily.   Yes Historical Provider, MD  metoprolol tartrate (LOPRESSOR) 25 MG tablet Take 25 mg by mouth 2 (two) times daily. 05/25/12  Yes Lelon Perla, MD  mycophenolate (MYFORTIC) 180 MG EC tablet Take 360 mg by mouth 2 (two) times daily.    Yes Historical Provider, MD  nystatin (MYCOSTATIN) 100000 UNIT/ML suspension Take 500,000 Units by mouth daily. Swish and Spit 5 ml as directed   Yes Historical Provider, MD  pravastatin (PRAVACHOL) 40 MG tablet Take 40 mg by mouth daily. Take one tablet Mon, Wed, and Friday   Yes Historical Provider, MD  tacrolimus (PROGRAF) 1 MG capsule Take 5 mg by mouth 2 (two) times daily. Take 5 capsules in the am, and 5 capsules in the pm.   Yes Historical Provider, MD  triamcinolone cream (KENALOG) 0.1 % Apply 1 application topically 2 (two) times daily.    Yes Historical Provider, MD  warfarin (COUMADIN) 5 MG tablet Take 5-7.5 mg by mouth daily. Take 5 mg on Monday, Wednesday, Thursday, Friday, Saturday, and Sunday.  Take 7.5 mg on Tuesday.   Yes Historical Provider, MD    Current Facility-Administered Medications  Medication Dose Route Frequency Provider Last Rate Last Dose  . 0.9 %  sodium chloride infusion   Intravenous Continuous Dayna N Dunn, PA-C 75 mL/hr at 04/05/13 2006    . acetaminophen (TYLENOL) tablet 650 mg  650 mg Oral Q4H PRN Dayna N Dunn, PA-C      . [START ON 04/07/2013] cinacalcet (SENSIPAR) tablet 60 mg  60 mg Oral Q48H Dayna N Dunn, PA-C      . [START ON 04/06/2013] dapsone tablet 25 mg  25 mg Oral Daily Dayna N Dunn, PA-C      . diphenhydrAMINE (BENADRYL) capsule 50 mg  50 mg Oral Q6H PRN Dayna N Dunn, PA-C      . glipiZIDE (GLUCOTROL XL) 24 hr tablet 10 mg  10 mg Oral BID AC Dayna N Dunn, PA-C   10 mg at 04/05/13 1826  . heparin ADULT infusion 100 units/mL (25000 units/250 mL)  850 Units/hr Intravenous Continuous Lauren Bajbus, RPH 8.5 mL/hr at 04/05/13 1613 850 Units/hr at 04/05/13 1613   . [START ON 04/06/2013] influenza vac split quadrivalent PF (FLUARIX) injection 0.5 mL  0.5 mL Intramuscular Tomorrow-1000 Lelon Perla, MD      . insulin aspart (novoLOG) injection 0-9 Units  0-9 Units Subcutaneous TID WC Dayna N Dunn, PA-C   1 Units at 04/05/13 1659  . [START ON 04/06/2013] magnesium oxide (MAG-OX) tablet 800 mg  800 mg Oral Q1200 Dayna N Dunn, PA-C      . metoprolol tartrate (LOPRESSOR) tablet 25 mg  25 mg Oral BID Dayna N Dunn, PA-C   25 mg at 04/05/13 2003  . mycophenolate (MYFORTIC) EC tablet 360 mg  360 mg Oral BID Dayna N Dunn, PA-C   360 mg at 04/05/13 2004  . nitroGLYCERIN (NITROSTAT) SL tablet 0.4 mg  0.4 mg Sublingual Q5 Min x 3 PRN Dayna N Dunn, PA-C      . ondansetron (ZOFRAN) injection 4 mg  4 mg Intravenous Q6H PRN Dayna N Dunn, PA-C      . [START ON 04/06/2013] pravastatin (PRAVACHOL) tablet 40 mg  40 mg Oral Q M,W,F Dayna N Dunn, PA-C      . tacrolimus (PROGRAF) capsule 5 mg  5 mg Oral BID Dayna N Dunn, PA-C   5 mg at 04/05/13 2004  . triamcinolone cream (KENALOG) 0.1 % 1 application  1 application Topical BID Dayna N Dunn, PA-C   1 application at [id number] 2005    Allergies as of 03/26/2013 - Review Complete 02/12/2013  Allergen Reaction Noted  . Ibuprofen Nausea And Vomiting 05/22/2012  . Sulfamethoxazole w-trimethoprim Swelling   . Sulfonamide derivatives Swelling   . Tape Rash 01/26/2012  . Tramadol Nausea And Vomiting 05/22/2012  . Bactrim Swelling 10/15/2010    Family History  Problem Relation Age of Onset  . Stroke Father   . Diabetes Other   . Hypertension Mother     History   Social History  . Marital Status: Married    Spouse Name: N/A    Number of Children: N/A  . Years of Education: N/A   Occupational History  . retired    Social History Main Topics  . Smoking status: Former Smoker -- 1.00 packs/day for 30 years    Types: Cigarettes    Quit date: 07/19/2001  . Smokeless tobacco: Never Used  . Alcohol Use: No  . Drug Use:  No  . Sexual Activity: Not Currently   Other Topics Concern  . Not on file   Social History Narrative   Patient signed a Designated Party Release to allow her spouse [name] and family and five children to have access to her medical records/information.      Review of Systems: Gen: no fever, chills, sweats, anorexia,+ fatigue, + weakness,  HEENT: No visual complaints, No history of Retinopathy. Normal external appearance No Epistaxis or Sore throat. No sinusitis.   CV: .negative for edema, orthopnea, palpitations, paroxysmal nocturnal dyspnea  Resp: Denies dyspnea at rest, dyspnea with exercise, cough, sputum, wheezing, coughing up blood, and pleurisy. GI: Denies vomiting blood, jaundice, and fecal incontinence.   Denies dysphagia or odynophagia. GU : history of transplant with no infections or rejections MS: Denies joint pain, limitation of movement, and swelling, stiffness, low back pain, extremity pain. Denies muscle weakness, cramps, atrophy.  No use of non steroidal antiinflammatory drugs. Derm: Denies rash, itching, dry skin, hives, moles, warts, or unhealing ulcers.  Psych: Denies depression, anxiety, memory loss, suicidal ideation, hallucinations, paranoia, and confusion. Heme: Denies bruising, bleeding, and enlarged lymph nodes. Neuro: No headache.  No diplopia. No dysarthria.  No dysphasia.   history of CVA.  No Seizures. No paresthesias.  No weakness. Endocrine + DM.  No Thyroid disease.  No Adrenal disease.  Physical Exam: Vital signs in last 24 hours: Temp:  [98.4 F (36.9 C)] 98.4 F (36.9 C) (09/18 1300) Pulse Rate:  [77] 77 (09/18 1300)  BP: (132)/(57) 132/57 mmHg (09/18 1300) SpO2:  [94 %] 94 % (09/18 1300) Weight:  [75 kg (165 lb 5.5 oz)] 75 kg (165 lb 5.5 oz) (09/18 1300) Last BM Date: 04/05/13 General:   Alert,  Well-developed, well-nourished, pleasant and cooperative in NAD Head:  Normocephalic and atraumatic. Eyes:  Sclera clear, no icterus.   Conjunctiva  pink. Ears:  Normal auditory acuity. Nose:  No deformity, discharge,  or lesions. Mouth:  No deformity or lesions, dentition normal. Neck:  Supple; no masses or thyromegaly. JVP not elevated Lungs:  Clear throughout to auscultation.   No wheezes, crackles, or rhonchi. No acute distress. Heart:  Regular rate and rhythm; , clicks, rubs,  or gallops.3/6 SEM RUSB with diminished S2 Abdomen:  Soft, nontender and nondistended. No masses, hepatosplenomegaly or hernias noted. Normal bowel sounds, without guarding, and without rebound.   Msk:  Symmetrical without gross deformities. Normal posture. Pulses:  No carotid, renal, femoral bruits. DP and PT symmetrical and equal Extremities:  Without clubbing or edema. Neurologic:  Alert and  oriented x4;  grossly normal neurologically. Skin:  Intact without significant lesions or rashes. Cervical Nodes:  No significant cervical adenopathy. Psych:  Alert and cooperative. Normal mood and affect.  Intake/Output from previous day:   Intake/Output this shift:    Lab Results:  Recent Labs  04/05/13 1406  WBC 6.0  HGB 13.4  HCT 42.9  PLT 232   BMET  Recent Labs  04/05/13 1406  NA 133*  K 4.2  CL 101  CO2 24  GLUCOSE 115*  BUN 30*  CREATININE 1.40*  CALCIUM 9.7   LFT No results found for this basename: PROT, ALBUMIN, AST, ALT, ALKPHOS, BILITOT, BILIDIR, IBILI,  in the last 72 hours PT/INR  Recent Labs  04/05/13 1406  LABPROT 14.2  INR 1.12   Hepatitis Panel No results found for this basename: HEPBSAG, HCVAB, HEPAIGM, HEPBIGM,  in the last 72 hours  Studies/Results: Dg Chest 2 View  04/05/2013   CLINICAL DATA:  Heart catheterization preop examination  EXAM: CHEST  2 VIEW  COMPARISON:  Chest radiograph 12/15/2010  FINDINGS: Stable enlarged cardiac silhouette. There is mild central venous pulmonary congestion. No effusion, infiltrate, or pneumothorax. Calcification of the aorta is noted.  IMPRESSION: Cardiomegaly and central venous  congestion. No overt pulmonary edema.   Electronically Signed   By: Suzy Bouchard M.D.   On: 04/05/2013 18:05    Assessment/Plan:  Chronic renal failure stage 5 status post renal transplant with stable allograft function.  Immunosuppression stable on Cellcept and prograf no predisone  Anemia no ESA last Hb 13.4  Bones sensipar for hypercalcemia and secondary  HPT   HTn controlled  Pre-contrast  Agree with hydration prior to contrast. There appears to be no firm indications for mucomyst based on recent review of the data.   LOS: 0 [name] W @TODAY @8 :39 PM

## 2013-04-05 NOTE — Progress Notes (Signed)
ANTICOAGULATION CONSULT NOTE - Initial Consult  Pharmacy Consult for heparin Indication: h/o embolic CVA/Afib  Allergies  Allergen Reactions  . Ibuprofen Nausea And Vomiting  . Sulfamethoxazole W-Trimethoprim Swelling  . Sulfonamide Derivatives Swelling  . Tape Rash  . Tramadol Nausea And Vomiting  . Bactrim Swelling  . Red Dye     Patient Measurements: Height: 5\' 2"  (157.5 cm) Weight: 165 lb 5.5 oz (75 kg) IBW/kg (Calculated) : 50.1 Heparin Dosing Weight: 70.4kg  Vital Signs: Temp: 98.4 F (36.9 C) (09/18 1300) BP: 132/57 mmHg (09/18 1300) Pulse Rate: 77 (09/18 1300)  Labs:  Recent Labs  04/05/13 1406  HGB 13.4  HCT 42.9  PLT 232  LABPROT 14.2  INR 1.12    Estimated Creatinine Clearance: 37.3 ml/min (by C-G formula based on Cr of 1.35).   Medical History: Past Medical History  Diagnosis Date  . Coronary artery disease 05/2002    a. Ant MI 2003 s/p PTCA/stent to RCA.   . Paroxysmal a-fib   . Hypertension   . Hyperlipidemia   . CHF (congestive heart failure)   . Pericardial effusion     a. Small by echo 11/2011.  Marland Kitchen GERD (gastroesophageal reflux disease)   . 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 Unspecified hearing loss   . Anemia, iron deficiency     of chronic disease  . Helicobacter pylori (H. pylori) infection     hx of  . Esophagitis, reflux   . Gout   . Cholelithiasis   . Hx of colonic polyps   . Diverticulosis of colon   . 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  Marland Kitchen Chronic cough onset 03/2010    Dr Melvyn Novas  . Carotid artery disease     a. Carotid Dopplers performed in August 2013 showed 40-59% left stenosis and 0-39% right; f/u recommended in 2 years.   . Aortic stenosis     a. Severe AS by echo 11/2012.  . Asystole     a. During ENT surgery 2005: developed  marked asystole requiring CPR, felt due to vagal reaction (cath nonobst dz).  . Myocardial infarction 2003  . Cerebrovascular accident 2009    a. LMCA infarct felt embolic [id number], maintained on chronic coumadin.; denies residual on 04/05/2013  . Sleep apnea     Pt says testing was positive, intolerant of CPAP.  Marland Kitchen Type II diabetes mellitus   . ESRD (end stage renal disease)     a. Mass on L kidney per pt s/p nephrectomy - pt states not cancer - [facility] notes indicate ESRD due to HTN/DM - was previously on HD. b. Kidney transplant 02/2011.   Assessment: [name] planned direct admission for hydration pre-cath. She was on full dose Lovenox at home in preparation for cath. Her last dose was this morning at 0800. Heparin will be started after 75% of the dosing interval is complete- since she was taking Lovenox Q12h, this will be ~9 hours. Her CBC and INR WNL and no bleeding noted.  Goal of Therapy:  Heparin level 0.3-0.7 units/ml Monitor platelets by anticoagulation protocol: Yes   Plan:  1. Start heparin with NO BOLUS at 850units/hr at 1700 tonight 2. HL 6 hours after starting 3. Daily HL and CBC if to continue on heparin 4. Follow up after cath  Cambelle Suchecki D. Teegan Brandis, PharmD Clinical Pharmacist  Pager: [phone] 04/05/2013 2:59 PM

## 2013-04-05 NOTE — H&P (Signed)
History and Physical  [name] MRN: [id number], DOB: [birth year] Date of Encounter: 04/05/2013, 2:02 PM Primary Physician: Walker Kehr, MD Primary Cardiologist: [name] Primary Nephrologist: Previously Dr. Hassell Done, now Dr. Tye Savoy. 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 She is here for cath prior to further evaluation of her AS. She was evaluated by Dr. [name] in late July for increasing dsypnea on exertion. She began to feel like she was having asthma attacks. 2D echo from 11/2012 was consistent with severe AS. The patient wanted to contemplate this further and underwent dental eval in August. Recently she called the office and was ready to set up cath/valve surgery. She took her last dose of Coumadin on 03/31/13 and was placed on Lovenox bridge. She presented for planned admission for hydration prior to surgery & neprology eval. She may well need aortic valve replacement, maze procedure +/- CABG pending findings on catheterization. She could also be considered for TAVR but Dr. [name] felt she was most likely a surgical candidate. She denies any CP, LEE, orthopnea, or syncope. She currently has no complaints.  Past Medical History  Diagnosis Date  . Coronary artery disease 05/2002    a. Ant MI 2003 s/p PTCA/stent to RCA.   . Paroxysmal a-fib   . Myocardial infarction     hx of  . Hypertension   . Hyperlipidemia   . CHF (congestive heart failure)   . ESRD (end stage renal disease)     a. Mass on L kidney per pt s/p nephrectomy - pt states not cancer - [facility] notes indicate ESRD  due to HTN/DM - was previously on HD. b. Kidney transplant 02/2011.  Marland Kitchen Pericardial effusion     a. Small by echo 11/2011.  Marland Kitchen Cerebrovascular accident     a. LMCA infarct felt embolic 123XX123, maintained on chronic coumadin.  Marland Kitchen GERD (gastroesophageal reflux disease)   . 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 Unspecified hearing loss   . Diabetes mellitus     type 2  . Anemia, iron deficiency     of chronic disease  . Helicobacter pylori (H. pylori) infection     hx of  . Esophagitis, reflux   . Gout   . Sleep apnea     Pt says testing was positive, intolerant of CPAP.  Marland Kitchen Cholelithiasis   . Hx of colonic polyps   . Diverticulosis of colon   . 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  Marland Kitchen Chronic cough onset 03/2010    Dr Melvyn Novas  . Carotid artery disease     a. Carotid Dopplers performed in August 2013 showed 40-59% left stenosis and 0-39% right; f/u recommended in 2 years.   . Aortic stenosis     a. Severe AS by echo 11/2012.  . Asystole     a. During ENT surgery 2005: developed marked asystole requiring CPR, felt due to vagal reaction (cath nonobst dz).  . Seizures  a. 2009 when she had stroke.     Most Recent Cardiac Studies: 2D Echo 11/2012 - Left ventricle: The cavity size was mildly dilated. Wall thickness was increased in a pattern of moderate LVH. Systolic function was normal. The estimated ejection fraction was in the range of 55% to 60%. - Aortic valve: There was severe stenosis. - Mitral valve: Calcified annulus. - Left atrium: The atrium was moderately dilated. - Right atrium: The atrium was moderately dilated. - Atrial septum: No defect or patent foramen ovale was identified. - Pulmonary arteries: PA peak pressure: 91mm Hg (S).   Cath 11/2003 CARDIAC CATHETERIZATION  PROCEDURE: Cardiac  catheterization.  CLINICAL HISTORY: [name] is 67 years old and has had a previous VMI  treated with angioplasty by Dr. Lyndel Safe. She was having elective ENT surgery  and following surgery she developed bradycardia and marked asystole  requiring external cardiac compression. She was admitted to the hospital  and stabilized and seen in consultation with Dr. Lyndel Safe who felt she should  be catheterized to rule out ischemic etiology. When she came to the  catheterization lab she was in atrial fibrillation with a rate of about 90  and she converted, while in the catheterization lab, back to sinus rhythm.  CARDIOLOGIST: Eustace Quail, M.D.  DESCRIPTION OF PROCEDURE: The procedure was performed via the right femoral  artery utilizing an arterial sheath and 6-French preformed coronary  catheters. A front wall puncture was performed, and Omnipaque contrast was  used. The right femoral artery was closed with AngioSeal at the end of the  procedure. The patient tolerated the procedure well, and left the  laboratory in satisfactory condition.  RESULTS:  1. The aortic pressure was 187/101 with a mean of 135. The left midpressure  was 187/29.  2. The left main coronary artery was free of significant disease.  3. The left anterior descending artery gave rise to 3 large diagonal  branches and 2 large septal perforators. These and the LAD proper were  free of significant disease.  4. The circumflex artery gave rise to a ramus branch and 3 posterolateral  branches. These vessels were free of significant disease.  5. The right coronary artery is a moderate-size vessel that gave rise a  __________ branch, 3 right ventricular branches, a posterior descending  and 2 posterolateral branches. There was 40% narrowing in the midright  coronary artery.  LEFT VENTRICULOGRAM: The left ventriculogram performed in the RAO  projection showed hypokinesis of the inferobasal segment. The overall wall  motion was good with  an estimated ejection fraction was 60%.  CONCLUSIONS: Coronary artery disease status post remote documented  myocardial infarction treated with PTCA of the right coronary artery with  nonobstructive coronary disease with 40% narrowing in the midright coronary  artery and no significant obstruction in the circumflex and left anterior  descending arteries and inferobasal wall hypokinesis.  RECOMMENDATIONS: The patient's asystole following surgery is probably  vagally mediated possibly related to anesthesia of the surgery itself. She  may have sick sinus syndrome since she did have paroxysmal atrial  fibrillation  in the hospital, although her rate was controlled. She is at low risk for  systemic emboli from her atrial fibrillation since her LV function is good  and she is 67 years old. I think that we can continue her on her same  medications and we will adjust her medications for her blood pressure and  let her go home later today.   Cath 09/2001 Cardiac Catheterization  PROCEDURES PERFORMED:  1. Left heart catheterization.  2. Left ventriculogram.  3. Selective coronary angiography.  4. Percutaneous transluminal coronary angioplasty and stent placement, mid  right coronary artery.  DIAGNOSES:  1. Severe single-vessel coronary artery disease.  2. Normal left ventricular systolic function.  3. Acute inferior wall myocardial infarction.  HISTORY: Ms. Bruce is a 67 year old black female who presents to [facility] with acute onset substernal chest discomfort. The patient awoke at  approximately 10 a.m. and had substernal chest discomfort with shortness of  breath and nausea. She felt worse in the morning and finally presented to  [facility] Emergency Room at approximately 12 noon where she was found to have ST  elevation in the inferior leads significant with an acute inferior wall  myocardial infarction. She was brought to the catheterization lab emergently  for left heart  catheterization.  DESCRIPTION OF PROCEDURE: Informed consent was obtained. The patient was  brought to the catheterization lab. A #7 [place] arterial and #6 French venous  sheath were placed in the right groin using the modified Seldinger technique.  A #5 French pigtail catheter was then advanced into the left ventricle.  Appropriate hemodynamics were obtained. The left ventriculogram was then  performed using power injections of contrast. A #6 [place] JL-4 catheter was  then used to engage the left coronary artery and selective angiography  performed using manual injections of contrast. Finally, a #7 [place] JR-4  guide catheter was introduced and used to engage the right coronary artery.  Selective angiography was performed using manual injections of contrast.  Initial findings are as follows:  FINDINGS:  1. Left main trunk: Mild irregularities.  2. LAD: This is a large caliber vessel that provides 3 diagonal branches.  There is moderate disease of 30-40% in the mid section of the LAD.  3. Left circumflex artery: This is a large caliber vessel that provides a  small first marginal branch in the proximal segment, and a large  bifurcating second marginal branch distally. There is mild disease of  30% at the bifurcation of the second marginal branch.  4. Right coronary artery: Dominant. This is a small caliber vessel that  provides several small branches of the distal segment that ______ the  proximal mid inferior septum. The right coronary artery has subtotal  narrowing of 99% in the mid section followed by narrowing of 70%. This  mid right coronary artery was severely and diffusely diseased. There  were several RV marginal branches that arise from this segment. The  distal vessel had mild irregularities.  LV: Normal end systolic and end diastolic dimensions. Overall left  ventricular function appeared well preserved. Ejection fraction of greater  than 65%. No mitral regurgitation. No wall  motion abnormalities were noted.  LV pressure was 144/10. Aortic was 144/85. LVEDP equals 20.  With these findings, we elected to proceed with percutaneous intervention to  the right coronary artery. The patient previously had been consented for  participation in the [name] study and was randomized to placebo. She  received aspirin and heparin in the emergency room. ACT was maintained at  greater than 214 seconds. She was given 300 mg of Plavix orally and ReoPro  bolus and infusion on a weight-adjusted basis. A 0.014-inch Extra Sport wire  was advanced in the distal right coronary artery and a 2.5- x 15-mm CrossSail  balloon introduced. One inflation was performed at the site of subtotal  occlusion at 6 atmospheres for 30 seconds. A total of 5 additional  inflations  were performed within the mid section of the right coronary artery at up to 8  atmospheres for 30 seconds. Repeat angiography showed significant improvement  in vessel lumen with approximately 50% residual narrowing. There was  improvement of TIMI grade 1 to TIMI grade 3 flow. There was no evidence of  vessel damage or distal thromboembolic phenomena; however, moderate vessel  recoil was noted. A 2.75- x 24-mm Express stent was introduced and carefully  positioned between the RV marginal branches and deployed at 12 atmospheres for  30 seconds. Mild residual waist was noted of the proximal section of the  stent of 20-30% and a 2.75- x 12-mm Quantum Maverick balloon was introduced.  A total of 3 inflations were performed within the proximal and mid section of  the stent at up to 16 atmospheres for 30 seconds. Repeat angiography was then  performed after the administration of intracoronary nitroglycerin showing an  excellent result with no residual stenosis, TIMI-3 flow in the distal vessel,  and no distal vessel damage. The guide catheter was then removed. The sheath  was secured in position. The patient tolerated the procedure  well and was  transferred to the floor in stable condition.  FINAL RESULT: Successful percutaneous transluminal coronary angioplasty and  stent placement of the mid right coronary artery with reduction of 99%  subtotal narrowing to 0% with placement of a 2.75- x 24-mm Express 2 stent and  improvement in TIMI grade 1 to TIMI grade 3 flow.  ASSESSMENT AND PLAN:  Dictated by: Christy Sartorius, M.D. [place]  Attending Physician: Alvie Heidelberg  DD: 09/23/01  TD: 09/23/01    Surgical History:  Past Surgical History  Procedure Laterality Date  . Pci of rt coronary artery    . Cholecystectomy  2009  . Left av graft      removal of  . Right av fistula    . Nasal septal      surgery  . Tonsillectomy    . Abdominal hysterectomy    . Oophorectomy    . Tubal ligation    . Nephrectomy  2010    no CA on bx  . Kidney transplant  03/16/11  . Cardioversion  05/29/2012    Procedure: CARDIOVERSION;  Surgeon: Lelon Perla, MD;  Location: [facility] ENDOSCOPY;  Service: Cardiovascular;  Laterality: N/A;     Home Meds: Prior to Admission medications   Medication Sig Start Date End Date Taking? Authorizing Provider  cinacalcet (SENSIPAR) 60 MG tablet Take 60 mg by mouth every other day.   Yes Historical Provider, MD  dapsone 25 MG tablet Take 25 mg by mouth daily.    Yes Historical Provider, MD  diphenhydrAMINE (BENADRYL) 50 MG capsule Take 1 capsule (50 mg total) by mouth every 6 (six) hours as needed for itching. 08/31/11  Yes [name] Plotnikov, MD  enoxaparin (LOVENOX) 80 MG/0.8ML injection Inject 0.8 mLs (80 mg total) into the skin every 12 (twelve) hours. 03/30/13  Yes Lelon Perla, MD  Flurandrenolide 4 MCG/SQCM TAPE Apply topically daily.   Yes Historical Provider, MD  furosemide (LASIX) 20 MG tablet Take 1 tablet by mouth daily. 09/18/12  Yes Historical Provider, MD  glipiZIDE (GLUCOTROL XL) 10 MG 24 hr tablet Take 10 mg by mouth 2 (two) times daily.   Yes Historical Provider, MD    Magnesium 400 MG CAPS Take 800 mg by mouth daily.   Yes Historical Provider, MD  metoprolol tartrate (LOPRESSOR) 25 MG tablet Take 25  mg by mouth 2 (two) times daily. 05/25/12  Yes Lelon Perla, MD  mycophenolate (MYFORTIC) 180 MG EC tablet Take 360 mg by mouth 2 (two) times daily.    Yes Historical Provider, MD  nystatin (MYCOSTATIN) 100000 UNIT/ML suspension Take 500,000 Units by mouth daily. Swish and Spit 5 ml as directed   Yes Historical Provider, MD  pravastatin (PRAVACHOL) 40 MG tablet Take 40 mg by mouth daily. Take one tablet Mon, Wed, and Friday   Yes Historical Provider, MD  tacrolimus (PROGRAF) 1 MG capsule Take 5 mg by mouth 2 (two) times daily. Take 5 capsules in the am, and 5 capsules in the pm.   Yes Historical Provider, MD  triamcinolone cream (KENALOG) 0.1 % Apply 1 application topically 2 (two) times daily.    Yes Historical Provider, MD  warfarin (COUMADIN) 5 MG tablet Take 5-7.5 mg by mouth daily. Take 5 mg on Monday, Wednesday, Thursday, Friday, Saturday, and Sunday.  Take 7.5 mg on Tuesday.   Yes Historical Provider, MD    Allergies:  Allergies  Allergen Reactions  . Ibuprofen Nausea And Vomiting  . Sulfamethoxazole W-Trimethoprim Swelling  . Sulfonamide Derivatives Swelling  . Tape Rash  . Tramadol Nausea And Vomiting  . Bactrim Swelling  . Red Dye     History   Social History  . Marital Status: Married    Spouse Name: N/A    Number of Children: N/A  . Years of Education: N/A   Occupational History  . retired    Social History Main Topics  . Smoking status: Former Smoker -- 1.00 packs/day for 30 years    Types: Cigarettes    Quit date: 07/19/2001  . Smokeless tobacco: Not on file  . Alcohol Use: No  . Drug Use: No  . Sexual Activity: Not Currently   Other Topics Concern  . Not on file   Social History Narrative   Patient signed a Designated Party Release to allow her spouse [name] and family and five children to have access to her medical  records/information.       Family History  Problem Relation Age of Onset  . Stroke Father   . Diabetes Other   . Hypertension Mother     Review of Systems: General: negative for chills, fever, night sweats Cardiovascular: negative for edema, orthopnea, palpitations, paroxysmal nocturnal dyspnea Dermatological: negative for rash Respiratory: negative for wheezing Urologic: negative for hematuria Abdominal: negative for nausea, vomiting, diarrhea, bright red blood per rectum, melena, or hematemesis Neurologic: negative for visual changes, syncope, or dizziness All other systems reviewed and are otherwise negative except as noted above.  Labs: labs for this admission pending   Lab Results  Component Value Date   WBC 6.6 03/30/2013   HGB 13.5 03/30/2013   HCT 41.3 03/30/2013   MCV 81.9 03/30/2013   PLT 274 03/30/2013     Recent Labs Lab 03/30/13 1634  NA 138  K 4.0  CL 104  CO2 27  BUN 29*  CREATININE 1.35*  CALCIUM 9.2  GLUCOSE 140*    Lab Results  Component Value Date   CHOL 178 11/24/2011   HDL 66.90 11/24/2011   LDLCALC 81 11/24/2011   TRIG 153.0* 11/24/2011    Radiology/Studies:  No results found.   EKG: Atrial fib 70bpm, J pt elevation V2, TWI V5-V6, otherwise nonspecific ST changes   Physical Exam: Blood pressure 132/57, pulse 77, temperature 98.4 F (36.9 C), SpO2 94.00%. General: Well developed, well nourished AAF, in  no acute distress. Head: Normocephalic, atraumatic, sclera non-icteric, no xanthomas, nares are without discharge.  Neck: Bilateral carotid bruits. JVD not elevated. Lungs: Clear bilaterally to auscultation without wheezes, rales, or rhonchi. Breathing is unlabored. Heart: RRR with S1. 3/6 SEM RUSB with diminished S2.  No rubs or gallops appreciated. Abdomen: Soft, non-tender, non-distended with normoactive bowel sounds. No hepatomegaly. No rebound/guarding. No obvious abdominal masses. Msk:  Strength and tone appear normal for  age. Extremities: No clubbing or cyanosis. No edema.  Distal pedal pulses are 2+ and equal bilaterally. Neuro: Alert and oriented X 3. No focal deficit. No facial asymmetry. Moves all extremities spontaneously. Psych:  Responds to questions appropriately with a normal affect.   ASSESSMENT AND PLAN:  1. Severe aortic stenosis 2. CAD s/p MI/PCI to RCA 2005 3. ESRD due to HTN/DM previously on dialysis, s/p transplant 2012, on immunosuppressant therapy 4. Paroxysmal atrial fibrillation, on Coumadin->Lovenox PTA 5. 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 Diabetes mellitus 7. HTN, controlled 8. Hyperlipidemia, tolerating statin MWF  Pt is already scheduled for [facility] tomorrow. I discussed the risks risks and benefits of cardiac catheterization with the patient today. These include bleeding, infection, kidney damage, stroke, heart attack, death. The patient understands these risks and is willing to proceed. Will order heparin per pharmacy (Coumadin on hold, was on Lovenox bridge at home), hold Lasix, give IVF starting tonight, add SSI/CBG checks. I spoke with Dr. Justin Mend to request nephrology consultation to follow along - there is no current need for consultation tonight, so they will see her tomorrow. Continue other home meds. Would use dye conservatively.  Signed, Melina Copa PA-C 04/05/2013, 2:02 PM Patient seen and examined and history reviewed. Agree with above findings and plan. Pleasant 67 yo BF with severe AS. In July she had symptoms of dyspnea like an asthma attack that resolved spontaneously. Because of her symptoms cardiac cath was recommended to evaluate her coronaries, right heart pressures then proceed with plans for AVR. Patient expresses wish to have a mechanical valve placed but is also interested in TAVR. Will prehydrate for cath procedure in am. Coumadin held for procedure and will start heparin. Procedure and risks reviewed with her in detail.  Collier Salina [facility] 04/05/2013 3:47 PM

## 2013-04-06 ENCOUNTER — Encounter (HOSPITAL_COMMUNITY): Payer: Self-pay | Admitting: Thoracic Surgery (Cardiothoracic Vascular Surgery)

## 2013-04-06 ENCOUNTER — Encounter: Payer: Self-pay | Admitting: Physician Assistant

## 2013-04-06 ENCOUNTER — Other Ambulatory Visit: Payer: Self-pay | Admitting: *Deleted

## 2013-04-06 ENCOUNTER — Encounter (HOSPITAL_COMMUNITY)
Admission: AD | Disposition: A | Discharge status: Home Health | Payer: Self-pay | Source: Ambulatory Visit | Attending: Thoracic Surgery (Cardiothoracic Vascular Surgery)

## 2013-04-06 ENCOUNTER — Encounter (HOSPITAL_COMMUNITY): Payer: Medicare Other

## 2013-04-06 DIAGNOSIS — I2510 Atherosclerotic heart disease of native coronary artery without angina pectoris: Secondary | ICD-10-CM

## 2013-04-06 DIAGNOSIS — I359 Nonrheumatic aortic valve disorder, unspecified: Secondary | ICD-10-CM

## 2013-04-06 HISTORY — PX: LEFT AND RIGHT HEART CATHETERIZATION WITH CORONARY ANGIOGRAM: SHX5449

## 2013-04-06 LAB — LIPID PANEL
Cholesterol: 164 mg/dL (ref 0–200)
Total CHOL/HDL Ratio: 2.2 RATIO
VLDL: 25 mg/dL (ref 0–40)

## 2013-04-06 LAB — POCT I-STAT 3, VENOUS BLOOD GAS (G3P V)
Acid-base deficit: 1 mmol/L (ref 0.0–2.0)
pH, Ven: 7.317 — ABNORMAL HIGH (ref 7.250–7.300)

## 2013-04-06 LAB — CBC
HCT: 38.9 % (ref 36.0–46.0)
MCHC: 32.9 g/dL (ref 30.0–36.0)
MCV: 82.1 fL (ref 78.0–100.0)
RDW: 15.1 % (ref 11.5–15.5)
WBC: 5.8 10*3/uL (ref 4.0–10.5)

## 2013-04-06 LAB — GLUCOSE, CAPILLARY
Glucose-Capillary: 140 mg/dL — ABNORMAL HIGH (ref 70–99)
Glucose-Capillary: 151 mg/dL — ABNORMAL HIGH (ref 70–99)

## 2013-04-06 LAB — BASIC METABOLIC PANEL WITH GFR
BUN: 28 mg/dL — ABNORMAL HIGH (ref 6–23)
CO2: 27 meq/L (ref 19–32)
Calcium: 8.9 mg/dL (ref 8.4–10.5)
Chloride: 109 meq/L (ref 96–112)
Creatinine, Ser: 1.24 mg/dL — ABNORMAL HIGH (ref 0.50–1.10)
GFR calc Af Amer: 50 mL/min — ABNORMAL LOW
GFR calc non Af Amer: 43 mL/min — ABNORMAL LOW
Glucose, Bld: 104 mg/dL — ABNORMAL HIGH (ref 70–99)
Potassium: 4.1 meq/L (ref 3.5–5.1)
Sodium: 145 meq/L (ref 135–145)

## 2013-04-06 LAB — POCT ACTIVATED CLOTTING TIME: Activated Clotting Time: 150 seconds

## 2013-04-06 LAB — POCT I-STAT 3, ART BLOOD GAS (G3+)
Acid-base deficit: 1 mmol/L (ref 0.0–2.0)
pCO2 arterial: 43.5 mmHg (ref 35.0–45.0)
pO2, Arterial: 68 mmHg — ABNORMAL LOW (ref 80.0–100.0)

## 2013-04-06 SURGERY — LEFT AND RIGHT HEART CATHETERIZATION WITH CORONARY ANGIOGRAM
Anesthesia: LOCAL

## 2013-04-06 MED ORDER — NITROGLYCERIN 0.2 MG/ML ON CALL CATH LAB
INTRAVENOUS | Status: AC
  Filled 2013-04-06: qty 1

## 2013-04-06 MED ORDER — ASPIRIN 81 MG PO CHEW
324.0000 mg | CHEWABLE_TABLET | ORAL | Status: AC
  Administered 2013-04-06: 324 mg via ORAL

## 2013-04-06 MED ORDER — SODIUM CHLORIDE 0.9 % IJ SOLN: 3.0000 mL | INTRAMUSCULAR | Status: DC | PRN

## 2013-04-06 MED ORDER — LIDOCAINE HCL (PF) 1 % IJ SOLN
INTRAMUSCULAR | Status: AC
  Filled 2013-04-06: qty 30

## 2013-04-06 MED ORDER — FENTANYL CITRATE 0.05 MG/ML IJ SOLN
INTRAMUSCULAR | Status: AC
  Filled 2013-04-06: qty 2

## 2013-04-06 MED ORDER — HYDRALAZINE HCL 20 MG/ML IJ SOLN
10.0000 mg | INTRAMUSCULAR | Status: DC | PRN
  Filled 2013-04-06: qty 1

## 2013-04-06 MED ORDER — LABETALOL HCL 5 MG/ML IV SOLN
20.0000 mg | INTRAVENOUS | Status: DC | PRN
  Administered 2013-04-11: 10 mg via INTRAVENOUS

## 2013-04-06 MED ORDER — HEPARIN (PORCINE) IN NACL 2-0.9 UNIT/ML-% IJ SOLN
INTRAMUSCULAR | Status: AC
  Filled 2013-04-06: qty 1000

## 2013-04-06 MED ORDER — SODIUM CHLORIDE 0.9 % IV SOLN: 1.0000 mL/kg/h | INTRAVENOUS | Status: AC

## 2013-04-06 MED ORDER — LABETALOL HCL 5 MG/ML IV SOLN
INTRAVENOUS | Status: AC
  Filled 2013-04-06: qty 4

## 2013-04-06 MED ORDER — SODIUM CHLORIDE 0.9 % IJ SOLN
3.0000 mL | Freq: Two times a day (BID) | INTRAMUSCULAR | Status: DC
  Administered 2013-04-09 – 2013-04-11 (×2): 3 mL via INTRAVENOUS

## 2013-04-06 MED ORDER — MIDAZOLAM HCL 2 MG/2ML IJ SOLN
INTRAMUSCULAR | Status: AC
  Filled 2013-04-06: qty 2

## 2013-04-06 MED ORDER — AMIODARONE HCL 200 MG PO TABS
400.0000 mg | ORAL_TABLET | Freq: Two times a day (BID) | ORAL | Status: DC
  Administered 2013-04-06 – 2013-04-11 (×8): 400 mg via ORAL
  Filled 2013-04-06 (×15): qty 2

## 2013-04-06 MED ORDER — SODIUM CHLORIDE 0.9 % IV SOLN
250.0000 mL | INTRAVENOUS | Status: DC | PRN
  Administered 2013-04-08 – 2013-04-10 (×2): 250 mL via INTRAVENOUS
  Administered 2013-04-11: 09:00:00 via INTRAVENOUS

## 2013-04-06 MED ORDER — HEPARIN (PORCINE) IN NACL 100-0.45 UNIT/ML-% IJ SOLN
1000.0000 [IU]/h | INTRAMUSCULAR | Status: AC
  Administered 2013-04-07 (×2): 800 [IU]/h via INTRAVENOUS
  Administered 2013-04-08 – 2013-04-10 (×3): 1000 [IU]/h via INTRAVENOUS
  Filled 2013-04-06 (×6): qty 250

## 2013-04-06 NOTE — Progress Notes (Signed)
S:  SP cath, no CO O:BP 102/76  Pulse 71  Temp(Src) 98.3 F (36.8 C) (Oral)  Resp 18  Ht 5\' 2"  (1.575 m)  Wt 83.462 kg (184 lb)  BMI 33.65 kg/m2  SpO2 96% No intake or output data in the 24 hours ending 04/06/13 1150 Weight change:  [id number] and alert CVS:RRR 3/6 systolic M LUSB Resp:clear Abd:+ BS NTND  TX LLQ NT Ext:no edema NEURO:CNI Ox3   . [START ON 04/07/2013] cinacalcet  60 mg Oral Q48H  . dapsone  25 mg Oral Daily  . glipiZIDE  10 mg Oral BID AC  . influenza vac split quadrivalent PF  0.5 mL Intramuscular Tomorrow-1000  . insulin aspart  0-9 Units Subcutaneous TID WC  . magnesium oxide  800 mg Oral Q1200  . metoprolol tartrate  25 mg Oral BID  . mycophenolate  360 mg Oral BID  . pravastatin  40 mg Oral Q M,W,F  . sodium chloride  3 mL Intravenous Q12H  . tacrolimus  5 mg Oral BID  . triamcinolone cream  1 application Topical BID   Dg Chest 2 View  04/05/2013   CLINICAL DATA:  Heart catheterization preop examination  EXAM: CHEST  2 VIEW  COMPARISON:  Chest radiograph 12/15/2010  FINDINGS: Stable enlarged cardiac silhouette. There is mild central venous pulmonary congestion. No effusion, infiltrate, or pneumothorax. Calcification of the aorta is noted.  IMPRESSION: Cardiomegaly and central venous congestion. No overt pulmonary edema.   Electronically Signed   By: Suzy Bouchard M.D.   On: 04/05/2013 18:05   BMET    Component Value Date/Time   NA 145 04/06/2013 0520   K 4.1 04/06/2013 0520   CL 109 04/06/2013 0520   CO2 27 04/06/2013 0520   GLUCOSE 104* 04/06/2013 0520   GLUCOSE 126* 06/13/2006 1525   BUN 28* 04/06/2013 0520   CREATININE 1.24* 04/06/2013 0520   CREATININE 1.35* 03/30/2013 1634   CALCIUM 8.9 04/06/2013 0520   CALCIUM 9.7 01/29/2008 1338   GFRNONAA 43* 04/06/2013 0520   GFRAA 50* 04/06/2013 0520   CBC    Component Value Date/Time   WBC 5.8 04/06/2013 0520   RBC 4.74 04/06/2013 0520   HGB 12.8 04/06/2013 0520   HCT 38.9 04/06/2013 0520   PLT 232  04/06/2013 0520   MCV 82.1 04/06/2013 0520   MCH 27.0 04/06/2013 0520   MCHC 32.9 04/06/2013 0520   RDW 15.1 04/06/2013 0520   LYMPHSABS 0.8 05/25/2012 1442   MONOABS 0.7 05/25/2012 1442   EOSABS 0.1 05/25/2012 1442   BASOSABS 0.0 05/25/2012 1442     Assessment: 1. Renal transplant with CKD3 2. Aortic Stenosis 3. HTN 4. Hx stroke  Plan: 1. Cont IV fluids 2. Recheck Scr in AM 3. Cont current immunosuppression   Kodi Guerrera T

## 2013-04-06 NOTE — Progress Notes (Addendum)
ANTICOAGULATION CONSULT NOTE - Follow Up  Pharmacy Consult for heparin Indication: h/o embolic CVA/Afib  Allergies  Allergen Reactions  . Ibuprofen Nausea And Vomiting  . Sulfamethoxazole W-Trimethoprim Swelling  . Sulfonamide Derivatives Swelling  . Tape Rash  . Tramadol Nausea And Vomiting  . Bactrim Swelling  . Red Dye     Patient Measurements: Height: 5\' 2"  (157.5 cm) Weight: 165 lb 5.5 oz (75 kg) IBW/kg (Calculated) : 50.1 Heparin Dosing Weight: 70.4kg  Vital Signs: Temp: 98.9 F (37.2 C) (09/18 2211) Temp src: Oral (09/18 2211) BP: 152/90 mmHg (09/18 2211) Pulse Rate: 79 (09/18 2211)  Labs:  Recent Labs  04/05/13 1406 04/05/13 2305  HGB 13.4  --   HCT 42.9  --   PLT 232  --   LABPROT 14.2  --   INR 1.12  --   HEPARINUNFRC  --  1.24*  CREATININE 1.40*  --     Estimated Creatinine Clearance: 36 ml/min (by C-G formula based on Cr of 1.4).   Medical History: Past Medical History  Diagnosis Date  . Coronary artery disease 05/2002    a. Ant MI 2003 s/p PTCA/stent to RCA.   . Paroxysmal a-fib   . Hypertension   . Hyperlipidemia   . CHF (congestive heart failure)   . Pericardial effusion     a. Small by echo 11/2011.  Marland Kitchen GERD (gastroesophageal reflux disease)   . 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 Unspecified hearing loss   . Anemia, iron deficiency     of chronic disease  . Helicobacter pylori (H. pylori) infection     hx of  . Esophagitis, reflux   . Gout   . Cholelithiasis   . Hx of colonic polyps   . Diverticulosis of colon   . 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  Marland Kitchen Chronic cough onset 03/2010    Dr Melvyn Novas  . Carotid artery disease     a. Carotid Dopplers performed in August 2013 showed 40-59% left stenosis and 0-39% right; f/u recommended in 2 years.   .  Aortic stenosis     a. Severe AS by echo 11/2012.  . Asystole     a. During ENT surgery 2005: developed marked asystole requiring CPR, felt due to vagal reaction (cath nonobst dz).  . Myocardial infarction 2003  . Cerebrovascular accident 2009    a. LMCA infarct felt embolic [id number], maintained on chronic coumadin.; denies residual on 04/05/2013  . Sleep apnea     Pt says testing was positive, intolerant of CPAP.  Marland Kitchen Type II diabetes mellitus   . ESRD (end stage renal disease)     a. Mass on L kidney per pt s/p nephrectomy - pt states not cancer - [facility] notes indicate ESRD due to HTN/DM - was previously on HD. b. Kidney transplant 02/2011.   Assessment: [name] planned direct admission for hydration pre-cath. She was on full dose Lovenox at home in preparation for cath. Her last dose was 9/18 @ 0800. Initial heparin level is supratherapeutic @ 1.24 units/ml, likely due to previous lovenox.  Goal of Therapy:  Heparin level 0.3-0.7 units/ml Monitor platelets by anticoagulation protocol: Yes   Plan:  1. Decrease heparin drip at 600 units/hr 2. She is scheduled for cath at 09:00 today, will f/u after cath.  Excell Seltzer, PharmD Clinical  Pharmacist Pager: [phone] 04/06/2013 12:34 AM  Addum:  Heparin level drawn this am is 1.05 units/ml, drawn ~ 5 hours after rate change.  Will cont drip at 600 units/hr and f/u after cath.

## 2013-04-06 NOTE — H&P (View-Only) (Signed)
   Subjective:  Denies CP or dyspnea   Objective:  Filed Vitals:   04/05/13 1300 04/05/13 2211 04/06/13 0427  BP: 132/57 152/90 152/82  Pulse: 77 79 78  Temp: 98.4 F (36.9 C) 98.9 F (37.2 C) 98.3 F (36.8 C)  TempSrc:  Oral Oral  Resp:  18 18  Height: 5\' 2"  (1.575 m)    Weight: 165 lb 5.5 oz (75 kg)  184 lb (83.462 kg)  SpO2: 94% 98% 96%    Intake/Output from previous day: No intake or output data in the 24 hours ending 04/06/13 0715  Physical Exam: Physical exam: Well-developed well-nourished in no acute distress.  Skin is warm and dry.  HEENT is normal.  Neck is supple.  Chest is clear to auscultation with normal expansion.  Cardiovascular exam is regular rate and rhythm. 3/6 systolic murmur Abdominal exam nontender or distended. No masses palpated. Extremities show no edema. neuro grossly intact    Lab Results: Basic Metabolic Panel:  Recent Labs  04/05/13 1406 04/06/13 0520  NA 133* 145  K 4.2 4.1  CL 101 109  CO2 24 27  GLUCOSE 115* 104*  BUN 30* 28*  CREATININE 1.40* 1.24*  CALCIUM 9.7 8.9   CBC:  Recent Labs  04/05/13 1406 04/06/13 0520  WBC 6.0 5.8  HGB 13.4 12.8  HCT 42.9 38.9  MCV 84.0 82.1  PLT 232 232    Assessment/Plan:  1 severe symptomatic AS- for right and left catheterization today. Limit dye and no ventriculogram. The risks and benefits were discussed and the patient agrees to proceed. I have discussed the patient with Dr. Roxy Manns. Hopefully if renal function is stable following procedure we can proceed with aortic valve replacement early next week. 2 chronic stage III renal failure-patient is status post renal transplant. She has been hydrated prior to catheterization. Follow renal function after procedure. Nephrology to follow after procedure. 3 atrial fibrillation-patient remains in atrial fibrillation. She has had a previous embolic CVA. Resume heparin following procedure. She may benefit from a maze procedure at the time of  her aortic valve replacement. Continue beta blocker. Follow heart rate on telemetry. 4 history of coronary artery disease-continue statin. 5 hypertension-continue metoprolol.  [name] 04/06/2013, 7:15 AM

## 2013-04-06 NOTE — Progress Notes (Signed)
   Subjective:  Denies CP or dyspnea   Objective:  Filed Vitals:   04/05/13 1300 04/05/13 2211 04/06/13 0427  BP: 132/57 152/90 152/82  Pulse: 77 79 78  Temp: 98.4 F (36.9 C) 98.9 F (37.2 C) 98.3 F (36.8 C)  TempSrc:  Oral Oral  Resp:  18 18  Height: 5\' 2"  (1.575 m)    Weight: 165 lb 5.5 oz (75 kg)  184 lb (83.462 kg)  SpO2: 94% 98% 96%    Intake/Output from previous day: No intake or output data in the 24 hours ending 04/06/13 0715  Physical Exam: Physical exam: Well-developed well-nourished in no acute distress.  Skin is warm and dry.  HEENT is normal.  Neck is supple.  Chest is clear to auscultation with normal expansion.  Cardiovascular exam is regular rate and rhythm. 3/6 systolic murmur Abdominal exam nontender or distended. No masses palpated. Extremities show no edema. neuro grossly intact    Lab Results: Basic Metabolic Panel:  Recent Labs  04/05/13 1406 04/06/13 0520  NA 133* 145  K 4.2 4.1  CL 101 109  CO2 24 27  GLUCOSE 115* 104*  BUN 30* 28*  CREATININE 1.40* 1.24*  CALCIUM 9.7 8.9   CBC:  Recent Labs  04/05/13 1406 04/06/13 0520  WBC 6.0 5.8  HGB 13.4 12.8  HCT 42.9 38.9  MCV 84.0 82.1  PLT 232 232    Assessment/Plan:  1 severe symptomatic AS- for right and left catheterization today. Limit dye and no ventriculogram. The risks and benefits were discussed and the patient agrees to proceed. I have discussed the patient with Dr. Roxy Manns. Hopefully if renal function is stable following procedure we can proceed with aortic valve replacement early next week. 2 chronic stage III renal failure-patient is status post renal transplant. She has been hydrated prior to catheterization. Follow renal function after procedure. Nephrology to follow after procedure. 3 atrial fibrillation-patient remains in atrial fibrillation. She has had a previous embolic CVA. Resume heparin following procedure. She may benefit from a maze procedure at the time of  her aortic valve replacement. Continue beta blocker. Follow heart rate on telemetry. 4 history of coronary artery disease-continue statin. 5 hypertension-continue metoprolol.  [name] 04/06/2013, 7:15 AM

## 2013-04-06 NOTE — CV Procedure (Signed)
   Cardiac Catheterization Procedure Note  Name: [name] MRN: [id number] DOB: 10-28-1945  Procedure: Right Heart Cath, Left Heart Cath, Selective Coronary Angiography  Indication: severe aortic stenosis   Procedural Details: The right groin was prepped, draped, and anesthetized with 1% lidocaine. Using the modified Seldinger technique a 5 French sheath was placed in the right femoral artery and a 7 French sheath was placed in the right femoral vein. A Swan-Ganz catheter was used for the right heart catheterization. Standard protocol was followed for recording of right heart pressures and sampling of oxygen saturations. Fick cardiac output was calculated. Standard Judkins catheters were used for selective coronary angiography. There were no immediate procedural complications. The patient was transferred to the post catheterization recovery area for further monitoring.  Procedural Findings: Hemodynamics RA 9 RV 49/8 PA 46/22 mean 35 PCWP v wave 41 mean 29 LV not recorded AO 178/94  Oxygen saturations: PA 62 AO 93  Cardiac Output (Fick) 4.54  Cardiac Index (Fick) 2.47   Coronary angiography: Coronary dominance: right  Left mainstem: Mildly calcified, widely patent. A rise from the left cusp it divides into the LAD and left circumflex.  Left anterior descending (LAD): Large-caliber vessel that reaches and wraps around the left ventricular apex. The LAD is moderately calcified. At the origin of the first diagonal there is 30-40% stenosis. There is no high-grade disease throughout the course of the LAD distribution. The diagonal branches are widely patent.  Left circumflex (LCx): The left circumflex is of large caliber. The vessel is widely patent throughout. He obtuse marginal branches are patent without significant stenoses.  Right coronary artery (RCA): The RCA is dominant. The vessel is heavily calcified. He RCA is totally occluded in the midportion within the previously  stented segment. There is faint filling of the distal RCA via left to right collaterals. The distal branch vessels of the RCA appear very small with severe diffuse disease.  Left ventriculography: Deferred because of chronic kidney disease  Final Conclusions:   1. Patent left main, LAD, and left circumflex, with mild nonobstructive disease of these vessels 2. 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 Heavily calcified aortic valve under fluoroscopic evaluation in this patient with known severe aortic stenosis 4. Pulmonary hypertension, suspect secondary to left heart disease (transpulmonic gradient less than 10 mm mercury)  Recommendations:  Cardiac surgery evaluation  Sherren Mocha 04/06/2013, 9:45 AM

## 2013-04-06 NOTE — Consult Note (Signed)
[address]       [name] [address]             [phone]          CARDIOTHORACIC SURGERY CONSULTATION REPORT  Referring Provider is [name], MD PCP is Walker Kehr, MD  Reason for consultation:  Severe aortic stenosis  HPI:  Patient is a 67 year old African American female from [place] with history of aortic stenosis, coronary artery disease, previous stroke, chronic persistent atrial fibrillation on Coumadin, type 2 diabetes mellitus, chronic kidney disease status post cadaveric renal transplantation in 2012, and pericardial effusion who has been followed for several years by Dr. [name] for her underlying cardiac disease including aortic stenosis, coronary artery disease, and atrial fibrillation.  She suffered an acute inferior wall myocardial infarction in 2003 for which she was treated with PCI and stenting of the right coronary artery.   The patient's most recent echocardiogram was performed in may of 2014.  At that time she was noted to have significant progression of the severity of aortic stenosis with peak velocity across the valve measured consistently greater than 4 m/s corresponding to a estimated mean transvalvular gradient of 40 mm mercury.  Left ventricular systolic function remained preserved with ejection fraction estimated 55-60%.  She was subsequently seen in follow up by Dr. [name] on 02/12/2013 at which time discussions were entertained regarding the possibility of elective surgical intervention.  The patient was admitted for elective left and right heart catheterization today and surgical consultation has been requested.  The patient has a long history of dyspnea on exertion with occasional paroxysms of resting shortness of breath that has progressed substantially over the past year. The patient now states that she gets short of breath with mild physical activity. If she goes up a flight of stairs she will be extremely winded by the  time she gets to the top, and often times she has to stop and crawl to the top of stairs because she gets so short of breath.  She also states that she gets occasional paroxysms of resting shortness of breath associated with tachypalpitations. This has apparently gone on sporadically for quite some time, but over the last year it has become more frequent. She denies any episodes of PND or orthopnea, but she has had some intermittent lower extremity edema. She's never had any dizzy spells nor syncope. She has not had any chest discomfort.  The patient reports that otherwise her physical mobility is limited primarily just because she gets short of breath and weak. She states that she does feel a little bit unsteady on her feet and often times will use a cane for ambulation.  She is retired having previously worked in [employer] for many years. She enjoys cooking, cleaning, sewing, travelling and gambling.    Past Medical History  Diagnosis Date  . Coronary artery disease 05/2002    a. Ant MI 2003 s/p PTCA/stent to RCA.   Marland Kitchen Hypertension   . Hyperlipidemia   . CHF (congestive heart failure)   . Pericardial effusion     a. Small by echo 11/2011.  Marland Kitchen GERD (gastroesophageal reflux disease)   . 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 Unspecified hearing loss   . Anemia, iron deficiency     of chronic disease  . Helicobacter pylori (H. pylori) infection     hx of  . Esophagitis, reflux   . Gout   .  Cholelithiasis   . Hx of colonic polyps   . Diverticulosis of colon   . 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  Marland Kitchen Chronic cough onset 03/2010    Dr Melvyn Novas  . Carotid artery disease     a. Carotid Dopplers performed in August 2013 showed 40-59% left stenosis and 0-39% right; f/u recommended in 2 years.   . Aortic  stenosis     a. Severe AS by echo 11/2012.  . Asystole     a. During ENT surgery 2005: developed marked asystole requiring CPR, felt due to vagal reaction (cath nonobst dz).  . Myocardial infarction 2003  . Cerebrovascular accident 2009    a. LMCA infarct felt embolic [id number], maintained on chronic coumadin.; denies residual on 04/05/2013  . Sleep apnea     Pt says testing was positive, intolerant of CPAP.  Marland Kitchen Type II diabetes mellitus   . ESRD (end stage renal disease)     a. Mass on L kidney per pt s/p nephrectomy - pt states not cancer - [facility] notes indicate ESRD due to HTN/DM - was previously on HD. b. Kidney transplant 02/2011.  . S/P kidney transplant 03/16/2011  . Chronic Persistent Atrial Fibrillation 12/31/2008    Qualifier: Diagnosis of  By: [name]      Past Surgical History  Procedure Laterality Date  . Cholecystectomy  2009  . Arteriovenous graft placement Left   . Av fistula placement Right   . Nasal reconstruction with septal repair      "took it out" (04/05/2013)  . Tubal ligation    . Nephrectomy Left 2010    no CA on bx  . Kidney transplant  03/16/11  . Cardioversion  05/29/2012    Procedure: CARDIOVERSION;  Surgeon: Lelon Perla, MD;  Location: [facility] ENDOSCOPY;  Service: Cardiovascular;  Laterality: N/A;  . Coronary angioplasty with stent placement Right     coronary artery  . Tonsillectomy    . Total abdominal hysterectomy    . Arteriovenous graft placement Left     "I've had 2 on my left; had one removed" (04/05/2013)   . Av fistula repair Right     "took it out" ((/18/2014)  . Insertion of dialysis catheter Bilateral     "over the years; took them both out" (04/05/2013)    Family History  Problem Relation Age of Onset  . Stroke Father   . Diabetes Other   . Hypertension Mother     History   Social History  . Marital Status: Married    Spouse Name: N/A    Number of Children: N/A  . Years of Education: N/A   Occupational History  . retired     Social History Main Topics  . Smoking status: Former Smoker -- 1.00 packs/day for 30 years    Types: Cigarettes    Quit date: 07/19/2001  . Smokeless tobacco: Never Used  . Alcohol Use: No  . Drug Use: No  . Sexual Activity: Not Currently   Other Topics Concern  . Not on file   Social History Narrative   Patient signed a Designated Party Release to allow her spouse [name] and family and five children to have access to her medical records/information.      Prior to Admission medications   Medication Sig Start Date End Date Taking? Authorizing Provider  cinacalcet (  SENSIPAR) 60 MG tablet Take 60 mg by mouth every other day.   Yes Historical Provider, MD  dapsone 25 MG tablet Take 25 mg by mouth daily.    Yes Historical Provider, MD  diphenhydrAMINE (BENADRYL) 50 MG capsule Take 1 capsule (50 mg total) by mouth every 6 (six) hours as needed for itching. 08/31/11  Yes [name] Plotnikov, MD  enoxaparin (LOVENOX) 80 MG/0.8ML injection Inject 0.8 mLs (80 mg total) into the skin every 12 (twelve) hours. 03/30/13  Yes Lelon Perla, MD  Flurandrenolide 4 MCG/SQCM TAPE Apply topically daily.   Yes Historical Provider, MD  furosemide (LASIX) 20 MG tablet Take 1 tablet by mouth daily. 09/18/12  Yes Historical Provider, MD  glipiZIDE (GLUCOTROL XL) 10 MG 24 hr tablet Take 10 mg by mouth 2 (two) times daily.   Yes Historical Provider, MD  Magnesium 400 MG CAPS Take 800 mg by mouth daily.   Yes Historical Provider, MD  metoprolol tartrate (LOPRESSOR) 25 MG tablet Take 25 mg by mouth 2 (two) times daily. 05/25/12  Yes Lelon Perla, MD  mycophenolate (MYFORTIC) 180 MG EC tablet Take 360 mg by mouth 2 (two) times daily.    Yes Historical Provider, MD  nystatin (MYCOSTATIN) 100000 UNIT/ML suspension Take 500,000 Units by mouth daily. Swish and Spit 5 ml as directed   Yes Historical Provider, MD  pravastatin (PRAVACHOL) 40 MG tablet Take 40 mg by mouth daily. Take one tablet Mon, Wed, and Friday    Yes Historical Provider, MD  tacrolimus (PROGRAF) 1 MG capsule Take 5 mg by mouth 2 (two) times daily. Take 5 capsules in the am, and 5 capsules in the pm.   Yes Historical Provider, MD  triamcinolone cream (KENALOG) 0.1 % Apply 1 application topically 2 (two) times daily.    Yes Historical Provider, MD  warfarin (COUMADIN) 5 MG tablet Take 5-7.5 mg by mouth daily. Take 5 mg on Monday, Wednesday, Thursday, Friday, Saturday, and Sunday.  Take 7.5 mg on Tuesday.   Yes Historical Provider, MD    Current Facility-Administered Medications  Medication Dose Route Frequency Provider Last Rate Last Dose  . 0.9 %  sodium chloride infusion   Intravenous Continuous Dayna N Dunn, PA-C 75 mL/hr at 04/05/13 2006    . 0.9 %  sodium chloride infusion  1 mL/kg/hr Intravenous Continuous Sherren Mocha, MD 83.5 mL/hr at 04/06/13 1115 1 mL/kg/hr at 04/06/13 1115  . 0.9 %  sodium chloride infusion  250 mL Intravenous PRN Sherren Mocha, MD      . acetaminophen (TYLENOL) tablet 650 mg  650 mg Oral Q4H PRN Dayna N Dunn, PA-C      . [START ON 04/07/2013] cinacalcet (SENSIPAR) tablet 60 mg  60 mg Oral Q48H Dayna N Dunn, PA-C      . dapsone tablet 25 mg  25 mg Oral Daily Dayna N Dunn, PA-C   25 mg at 04/06/13 1115  . diphenhydrAMINE (BENADRYL) capsule 50 mg  50 mg Oral Q6H PRN Dayna N Dunn, PA-C      . glipiZIDE (GLUCOTROL XL) 24 hr tablet 10 mg  10 mg Oral BID AC Dayna N Dunn, PA-C   10 mg at 04/05/13 1826  . heparin ADULT infusion 100 units/mL (25000 units/250 mL)  600 Units/hr Intravenous Continuous Dareen Piano, [facility]      . hydrALAZINE (APRESOLINE) injection 10 mg  10 mg Intravenous Q4H PRN Sherren Mocha, MD      . influenza vac split quadrivalent PF (FLUARIX)  injection 0.5 mL  0.5 mL Intramuscular Tomorrow-1000 Lelon Perla, MD      . insulin aspart (novoLOG) injection 0-9 Units  0-9 Units Subcutaneous TID WC Dayna N Dunn, PA-C   1 Units at 04/05/13 1659  . labetalol (NORMODYNE,TRANDATE) injection 20 mg  20  mg Intravenous Q2H PRN Sherren Mocha, MD      . magnesium oxide (MAG-OX) tablet 800 mg  800 mg Oral Q1200 Dayna N Dunn, PA-C      . metoprolol tartrate (LOPRESSOR) tablet 25 mg  25 mg Oral BID Dayna N Dunn, PA-C   25 mg at 04/05/13 2003  . mycophenolate (MYFORTIC) EC tablet 360 mg  360 mg Oral BID Dayna N Dunn, PA-C   360 mg at 04/06/13 0814  . nitroGLYCERIN (NITROSTAT) SL tablet 0.4 mg  0.4 mg Sublingual Q5 Min x 3 PRN Dayna N Dunn, PA-C      . ondansetron (ZOFRAN) injection 4 mg  4 mg Intravenous Q6H PRN Dayna N Dunn, PA-C      . pravastatin (PRAVACHOL) tablet 40 mg  40 mg Oral Q M,W,F Dayna N Dunn, PA-C      . sodium chloride 0.9 % injection 3 mL  3 mL Intravenous Q12H Sherren Mocha, MD      . sodium chloride 0.9 % injection 3 mL  3 mL Intravenous PRN Sherren Mocha, MD      . tacrolimus (PROGRAF) capsule 5 mg  5 mg Oral BID Dayna N Dunn, PA-C   5 mg at 04/06/13 0814  . triamcinolone cream (KENALOG) 0.1 % 1 application  1 application Topical BID Dayna N Dunn, PA-C   1 application at [id number]    Allergies  Allergen Reactions  . Ibuprofen Nausea And Vomiting  . Sulfamethoxazole W-Trimethoprim Swelling  . Sulfonamide Derivatives Swelling  . Tape Rash  . Tramadol Nausea And Vomiting  . Bactrim Swelling  . Red Dye       Review of Systems:   General:  normal appetite, decreased energy, + weight gain, no weight loss, no fever  Cardiac:  no chest pain with exertion, no chest pain at rest, + SOB with mild exertion, occasional resting SOB, no PND, no orthopnea, + palpitations, + arrhythmia, + atrial fibrillation, + intermittent LE edema, no dizzy spells, no syncope  Respiratory:  + shortness of breath, no home oxygen, intermittent productive cough, no dry cough, no bronchitis, no wheezing, no hemoptysis, no asthma, no pain with inspiration or cough, no sleep apnea, no CPAP at night  GI:   no difficulty swallowing, + reflux, no frequent heartburn, no hiatal hernia, no abdominal pain, no  constipation, no diarrhea, no hematochezia, no hematemesis, no melena  GU:   no dysuria,  no frequency, no urinary tract infection, no hematuria, no kidney stones, + chronic kidney disease  Vascular:  no pain suggestive of claudication, no pain in feet, no leg cramps, no varicose veins, no DVT, no non-healing foot ulcer  Neuro:   + stroke in 2009 manifest primarily as expressive aphasia, no TIA's, no seizures, no headaches, no temporary blindness one eye,  no slurred speech, no peripheral neuropathy, no chronic pain, mild instability of gait, no memory/cognitive dysfunction  Musculoskeletal: no arthritis, no joint swelling, no myalgias, mild difficulty walking, mildly limited mobility   Skin:   + rash, + chronic itching, no skin infections, no pressure sores or ulcerations  Psych:   no anxiety, no depression, + nervousness, no unusual recent stress  Eyes:   no  blurry vision, no floaters, no recent vision changes, no wears glasses or contacts  ENT:   mild hearing loss, no loose or painful teeth, no dentures, last saw dentist last month  Hematologic:  no easy bruising, no abnormal bleeding, no clotting disorder, no frequent epistaxis, no problems on chronic coumadin therapy  Endocrine:  + diabetes, checks CBG's at home twice daily     Physical Exam:   BP 157/86  Pulse 75  Temp(Src) 98.8 F (37.1 C) (Oral)  Resp 18  Ht 5\' 2"  (1.575 m)  Wt 83.462 kg (184 lb)  BMI 33.65 kg/m2  SpO2 99%  General:  Obese but  well-appearing  HEENT:  Unremarkable   Neck:   no JVD, no bruits, no adenopathy   Chest:   clear to auscultation, symmetrical breath sounds, no wheezes, no rhonchi   CV:   Irregular rate and rhythm, harsh systolic murmur at RUSB  Abdomen:  soft, non-tender, no masses   Extremities:  warm, well-perfused, pulses not palpable, no edema  Rectal/GU  Deferred  Neuro:   Grossly non-focal and symmetrical throughout  Skin:   Clean and dry, no rashes, no breakdown  Diagnostic  Tests:  Transthoracic Echocardiography  Patient: [name], [name] MR #: [id number] Study Date: 11/28/2012 Gender: F Age: 38 Height: 157.5cm Weight: 79.4kg BSA: 1.101m^2 Pt. Status: Room:  ATTENDING Charlestine Massed, Para March SONOGRAPHER Cindy Hazy, RDCS PERFORMING [facility]ac[facility] Pontes, Site [facility] cc:  ------------------------------------------------------------ LV EF: 55% - 60%  ------------------------------------------------------------ Indications: 424.1 Aortic valve disorders.  ------------------------------------------------------------ History: PMH: Acquired from the patient and from the patient's chart. PMH: Carotid Artery Stenosis. Paroxysmal Atrial Fibrillation. Pericardial Effusion. CVA. MI. CHF. CAD. End Stage Renal Disease. Fatigue. Sleep Apnea. Risk factors: Former tobacco use. Hypertension. Diabetes mellitus. Dyslipidemia.  ------------------------------------------------------------ Study Conclusions  - Left ventricle: The cavity size was mildly dilated. Wall thickness was increased in a pattern of moderate LVH. Systolic function was normal. The estimated ejection fraction was in the range of 55% to 60%. - Aortic valve: There was severe stenosis. - Mitral valve: Calcified annulus. - Left atrium: The atrium was moderately dilated. - Right atrium: The atrium was moderately dilated. - Atrial septum: No defect or patent foramen ovale was identified. - Pulmonary arteries: PA peak pressure: 16mm Hg (S). Transthoracic echocardiography. M-mode, complete 2D, spectral Doppler, and color Doppler. Height: Height: 157.5cm. Height: 62in. Weight: Weight: 79.4kg. Weight: 174.6lb. Body mass index: BMI: 32kg/m^2. Body surface area: BSA: 1.81m^2. Blood pressure: 124/69. Patient status: Outpatient. Location: [facility]  3  ------------------------------------------------------------  ------------------------------------------------------------ Left ventricle: The cavity size was mildly dilated. Wall thickness was increased in a pattern of moderate LVH. Systolic function was normal. The estimated ejection fraction was in the range of 55% to 60%.  ------------------------------------------------------------ Aortic valve: Severely calcified leaflets. Doppler: There was severe stenosis. VTI ratio of LVOT to aortic valve: 0.21. Valve area: 0.73cm^2(VTI). Indexed valve area: 0.4cm^2/m^2 (VTI). Peak velocity ratio of LVOT to aortic valve: 0.2. Valve area: 0.69cm^2 (Vmax). Indexed valve area: 0.38cm^2/m^2 (Vmax). Mean gradient: 29mm Hg (S). Peak gradient: 60mm Hg (S).  ------------------------------------------------------------ Aorta: The aorta was normal, not dilated, and non-diseased.  ------------------------------------------------------------ Mitral valve: Calcified annulus. Doppler: Trivial regurgitation. Peak gradient: 35mm Hg (D).  ------------------------------------------------------------ Left atrium: The atrium was moderately dilated.  ------------------------------------------------------------ Atrial septum: No defect or patent foramen ovale was identified.  ------------------------------------------------------------ Right ventricle: The cavity size was normal. Wall thickness was normal. Systolic function was normal.  ------------------------------------------------------------ Pulmonic valve: Doppler: Mild regurgitation.  ------------------------------------------------------------  Tricuspid valve: Doppler: Mild regurgitation.  ------------------------------------------------------------ Right atrium: The atrium was moderately dilated.  ------------------------------------------------------------ Pericardium: The pericardium was normal in  appearance.  ------------------------------------------------------------ Post procedure conclusions Ascending Aorta:  - The aorta was normal, not dilated, and non-diseased.  ------------------------------------------------------------  2D measurements Normal Doppler measurements Normal Left ventricle Main pulmonary LVID ED, 43.5 mm 43-52 artery chord, Pressure, 31 mm Hg =30 PLAX S LVID ES, 23.4 mm 23-38 Left ventricle chord, Ea, lat 7.13 cm/s ------ PLAX ann, tiss FS, chord, 46 % >29 DP PLAX E/Ea, lat 16.8 ------ LVPW, ED 17.3 mm ------ ann, tiss 3 IVS/LVPW 0.88 <1.3 DP ratio, ED Ea, med 5.37 cm/s ------ Ventricular septum ann, tiss IVS, ED 15.2 mm ------ DP LVOT E/Ea, med 22.3 ------ Diam, S 21 mm ------ ann, tiss 5 Area 3.46 cm^2 ------ DP Diam 21 mm ------ LVOT Aorta Peak vel, 84.2 cm/s ------ Root diam, 31 mm ------ S ED VTI, S 17.7 cm ------ Left atrium Stroke vol 61.3 ml ------ AP dim 62 mm ------ Stroke 33.9 ml/m^2 ------ AP dim 3.43 cm/m^2 <2.2 index index Aortic valve Peak vel, 420 cm/s ------ S Mean vel, 291 cm/s ------ S VTI, S 84.1 cm ------ Mean 40 mm Hg ------ gradient, S Peak 71 mm Hg ------ gradient, S VTI ratio 0.21 ------ LVOT/AV Area, VTI 0.73 cm^2 ------ Area index 0.4 cm^2/m ------ (VTI) ^2 Peak vel 0.2 ------ ratio, LVOT/AV Area, Vmax 0.69 cm^2 ------ Area index 0.38 cm^2/m ------ (Vmax) ^2 Mitral valve Peak E vel 120 cm/s ------ Decelerati 222 ms 150-23 on time 0 Peak 6 mm Hg ------ gradient, D Tricuspid valve Regurg 256 cm/s ------ peak vel Peak RV-RA 26 mm Hg ------ gradient, S Systemic veins Estimated 5 mm Hg ------ CVP Right ventricle Pressure, 31 mm Hg <30 S Sa vel, 12.8 cm/s ------ lat ann, tiss DP  ------------------------------------------------------------ Prepared and Electronically Authenticated by  Jenkins Rouge 2014-05-13T17:22:10.553    Cardiac Catheterization Procedure Note   Name: ISABELL ORDERS  MRN: [id number]  DOB: 10-19-1945  Procedure: Right Heart Cath, Left Heart Cath, Selective Coronary Angiography  Indication: severe aortic stenosis  Procedural Details: The right groin was prepped, draped, and anesthetized with 1% lidocaine. Using the modified Seldinger technique a 5 French sheath was placed in the right femoral artery and a 7 French sheath was placed in the right femoral vein. A Swan-Ganz catheter was used for the right heart catheterization. Standard protocol was followed for recording of right heart pressures and sampling of oxygen saturations. Fick cardiac output was calculated. Standard Judkins catheters were used for selective coronary angiography. There were no immediate procedural complications. The patient was transferred to the post catheterization recovery area for further monitoring.  Procedural Findings:  Hemodynamics  RA 9  RV 49/8  PA 46/22 mean 35  PCWP v wave 41 mean 29  LV not recorded  AO 178/94  Oxygen saturations:  PA 62  AO 93  Cardiac Output (Fick) 4.54  Cardiac Index (Fick) 2.47  Coronary angiography:  Coronary dominance: right  Left mainstem: Mildly calcified, widely patent. A rise from the left cusp it divides into the LAD and left circumflex.  Left anterior descending (LAD): Large-caliber vessel that reaches and wraps around the left ventricular apex. The LAD is moderately calcified. At the origin of the first diagonal there is 30-40% stenosis. There is no high-grade disease throughout the course of the LAD distribution. The diagonal branches are widely patent.  Left circumflex (LCx): The left circumflex  is of large caliber. The vessel is widely patent throughout. He obtuse marginal branches are patent without significant stenoses.  Right coronary artery (RCA): The RCA is dominant. The vessel is heavily calcified. He RCA is totally occluded in the midportion within the previously stented segment. There is faint filling of the distal RCA via  left to right collaterals. The distal branch vessels of the RCA appear very small with severe diffuse disease.  Left ventriculography: Deferred because of chronic kidney disease  Final Conclusions:  1. Patent left main, LAD, and left circumflex, with mild nonobstructive disease of these vessels  2. 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 Heavily calcified aortic valve under fluoroscopic evaluation in this patient with known severe aortic stenosis  4. Pulmonary hypertension, suspect secondary to left heart disease (transpulmonic gradient less than 10 mm mercury)  Recommendations:  Cardiac surgery evaluation  Sherren Mocha  04/06/2013, 9:45 AM     STS Risk Calculator  Procedure    Isolated AVR  Risk of Mortality   5.0% Morbidity or Mortality  31.9% Prolonged LOS   20.0% Short LOS    14.5% Permanent Stroke   4.2% Prolonged Vent Support  19.4% DSW Infection    2.0% Renal Failure    10.8% Reoperation    11.1%   Procedure    AVR + CABG  Risk of Mortality   6.9% Morbidity or Mortality  39.6% Prolonged LOS   23.7% Short LOS    8.8% Permanent Stroke   4.0% Prolonged Vent Support  30.0% DSW Infection    0.4% Renal Failure    13.1% Reoperation    14.4%    Impression:  The patient has severe symptomatic aortic stenosis with preserved left ventricular systolic function. I have personally reviewed images from the patient's most recent transthoracic echocardiogram. The aortic valve is tricuspid with severe leaflet restriction of all 3 leaflets. The patient may have relatively small aortic root although in one dimension the left ventricular output tract diameter was estimated 2.1 cm.  The patient does have significant left ventricular hypertrophy and likely significant diastolic dysfunction. The patient has single-vessel coronary artery disease with chronic occlusion of the right coronary artery with codominant coronary  circulation. Terminal branches of the distal right coronary artery system are very small and may not be suitable for coronary artery bypass grafting.  Patient also has history of chronic persistent atrial fibrillation with previous embolic stroke in [id number]. Risks associated with conventional surgical aortic valve replacement will be increased because of the patient's numerous comorbid medical problems.  However, in the absence of significant atherosclerotic disease affecting the ascending thoracic aorta I suspect the patient would best be treated using conventional surgical aortic valve replacement and Maze procedure.    Plan:  I've discussed the indications, risks, and potential benefits of aortic valve replacement with the patient here in the hospital this afternoon.  The patient was counseled at length regarding treatment alternatives for management of severe symptomatic aortic stenosis. The risks and benefits of surgical intervention has been discussed in detail. Long-term prognosis with medical therapy was discussed. Alternative approaches such as conventional surgical aortic valve replacement, transcatheter aortic valve replacement, and palliative medical therapy were compared and contrasted at length. This discussion was placed in the context of the patient's own specific clinical presentation and past medical history.  All of her questions have been addressed.  We will obtain a noncontrast CT scan of the chest  to further evaluate the patient's ascending thoracic aorta. The patient will also undergo routine preoperative testing including pulmonary function tests and a carotid duplex scan.  She might benefit from formal physical therapy consultation to assess her problems with balance and physical mobility to better anticipate possible need for her postoperative convalescence. Presuming that the patient's renal function remained stable following her catheterization, we could potentially proceed with  surgery next Wednesday. I would favor starting the patient on amiodarone preoperatively.    Valentina Gu. Roxy Manns, MD 04/06/2013 5:18 PM  I spent in excess of 90 minutes of time directly involved in the conduct of this consultation.

## 2013-04-06 NOTE — Interval H&P Note (Signed)
History and Physical Interval Note:  04/06/2013 9:07 AM  [name]  has presented today for surgery, with the diagnosis of Chest pain  The various methods of treatment have been discussed with the patient and family. After consideration of risks, benefits and other options for treatment, the patient has consented to  Procedure(s): LEFT AND RIGHT HEART CATHETERIZATION WITH CORONARY ANGIOGRAM (N/A) as a surgical intervention .  The patient's history has been reviewed, patient examined, no change in status, stable for surgery.  I have reviewed the patient's chart and labs.  Questions were answered to the patient's satisfaction.    Cath Lab Visit (complete for each Cath Lab visit)  Clinical Evaluation Leading to the Procedure:   ACS: no  Non-ACS:    Anginal Classification: No Symptoms  Anti-ischemic medical therapy: No Therapy  Non-Invasive Test Results: No non-invasive testing performed  Prior CABG: No previous CABG        Sherren Mocha

## 2013-04-07 ENCOUNTER — Inpatient Hospital Stay (HOSPITAL_COMMUNITY): Payer: Medicare Other

## 2013-04-07 DIAGNOSIS — R911 Solitary pulmonary nodule: Secondary | ICD-10-CM | POA: Insufficient documentation

## 2013-04-07 DIAGNOSIS — I2510 Atherosclerotic heart disease of native coronary artery without angina pectoris: Secondary | ICD-10-CM

## 2013-04-07 DIAGNOSIS — Z01810 Encounter for preprocedural cardiovascular examination: Secondary | ICD-10-CM

## 2013-04-07 HISTORY — DX: Solitary pulmonary nodule: R91.1

## 2013-04-07 LAB — COMPREHENSIVE METABOLIC PANEL
ALT: 61 U/L — ABNORMAL HIGH (ref 0–35)
AST: 59 U/L — ABNORMAL HIGH (ref 0–37)
Albumin: 2.8 g/dL — ABNORMAL LOW (ref 3.5–5.2)
Alkaline Phosphatase: 68 U/L (ref 39–117)
Calcium: 8.7 mg/dL (ref 8.4–10.5)
Glucose, Bld: 152 mg/dL — ABNORMAL HIGH (ref 70–99)
Potassium: 4 mEq/L (ref 3.5–5.1)
Sodium: 138 mEq/L (ref 135–145)
Total Protein: 5.7 g/dL — ABNORMAL LOW (ref 6.0–8.3)

## 2013-04-07 LAB — PREALBUMIN: Prealbumin: 18.5 mg/dL (ref 17.0–34.0)

## 2013-04-07 LAB — SURGICAL PCR SCREEN
MRSA, PCR: NEGATIVE
Staphylococcus aureus: NEGATIVE

## 2013-04-07 LAB — HEMOGLOBIN A1C: Mean Plasma Glucose: 151 mg/dL — ABNORMAL HIGH (ref <117)

## 2013-04-07 LAB — CBC
HCT: 35.6 % — ABNORMAL LOW (ref 36.0–46.0)
MCH: 27 pg (ref 26.0–34.0)
MCV: 84.4 fL (ref 78.0–100.0)
Platelets: 210 10*3/uL (ref 150–400)
RBC: 4.22 MIL/uL (ref 3.87–5.11)
WBC: 5.3 10*3/uL (ref 4.0–10.5)

## 2013-04-07 LAB — BLOOD GAS, ARTERIAL
Acid-base deficit: 1.1 mmol/L (ref 0.0–2.0)
Drawn by: 36274
pCO2 arterial: 46.6 mmHg — ABNORMAL HIGH (ref 35.0–45.0)
pO2, Arterial: 80.3 mmHg (ref 80.0–100.0)

## 2013-04-07 LAB — URINE MICROSCOPIC-ADD ON

## 2013-04-07 LAB — GLUCOSE, CAPILLARY
Glucose-Capillary: 127 mg/dL — ABNORMAL HIGH (ref 70–99)
Glucose-Capillary: 120 mg/dL — ABNORMAL HIGH (ref 70–99)
Glucose-Capillary: 249 mg/dL — ABNORMAL HIGH (ref 70–99)

## 2013-04-07 LAB — APTT: aPTT: 47 seconds — ABNORMAL HIGH (ref 24–37)

## 2013-04-07 LAB — PROTIME-INR: INR: 1.08 (ref 0.00–1.49)

## 2013-04-07 LAB — URINALYSIS, ROUTINE W REFLEX MICROSCOPIC
Bilirubin Urine: NEGATIVE
Glucose, UA: NEGATIVE mg/dL
Hgb urine dipstick: NEGATIVE
Ketones, ur: NEGATIVE mg/dL
Specific Gravity, Urine: 1.018 (ref 1.005–1.030)
pH: 7 (ref 5.0–8.0)

## 2013-04-07 LAB — HEPARIN LEVEL (UNFRACTIONATED)
Heparin Unfractionated: 0.18 IU/mL — ABNORMAL LOW (ref 0.30–0.70)
Heparin Unfractionated: 0.33 IU/mL (ref 0.30–0.70)

## 2013-04-07 MED ORDER — ~~LOC~~ CARDIAC SURGERY, PATIENT & FAMILY EDUCATION
Freq: Once | Status: AC
  Administered 2013-04-07: 11:00:00
  Filled 2013-04-07: qty 1

## 2013-04-07 MED ORDER — TACROLIMUS 1 MG PO CAPS
5.0000 mg | ORAL_CAPSULE | Freq: Two times a day (BID) | ORAL | Status: DC
  Administered 2013-04-07 – 2013-04-10 (×7): 5 mg via ORAL
  Filled 2013-04-07 (×12): qty 5

## 2013-04-07 MED ORDER — FUROSEMIDE 20 MG PO TABS
20.0000 mg | ORAL_TABLET | Freq: Every day | ORAL | Status: DC
  Administered 2013-04-07 – 2013-04-10 (×4): 20 mg via ORAL
  Filled 2013-04-07 (×5): qty 1

## 2013-04-07 MED ORDER — METOPROLOL TARTRATE 25 MG PO TABS
25.0000 mg | ORAL_TABLET | Freq: Two times a day (BID) | ORAL | Status: DC
  Filled 2013-04-07 (×2): qty 1

## 2013-04-07 MED ORDER — PRAVASTATIN SODIUM 40 MG PO TABS
40.0000 mg | ORAL_TABLET | ORAL | Status: DC
  Administered 2013-04-09: 40 mg via ORAL
  Filled 2013-04-07 (×3): qty 1

## 2013-04-07 MED ORDER — METOPROLOL TARTRATE 50 MG PO TABS
50.0000 mg | ORAL_TABLET | Freq: Two times a day (BID) | ORAL | Status: DC
  Administered 2013-04-07 – 2013-04-11 (×8): 50 mg via ORAL
  Filled 2013-04-07 (×2): qty 2
  Filled 2013-04-07: qty 1
  Filled 2013-04-07 (×2): qty 2
  Filled 2013-04-07: qty 1
  Filled 2013-04-07: qty 2
  Filled 2013-04-07: qty 1
  Filled 2013-04-07 (×4): qty 2
  Filled 2013-04-07: qty 1
  Filled 2013-04-07: qty 2

## 2013-04-07 MED ORDER — METOPROLOL TARTRATE 25 MG PO TABS
25.0000 mg | ORAL_TABLET | Freq: Two times a day (BID) | ORAL | Status: DC
  Filled 2013-04-07 (×3): qty 1

## 2013-04-07 MED ORDER — METOPROLOL TARTRATE 50 MG PO TABS: 50.0000 mg | ORAL_TABLET | Freq: Two times a day (BID) | ORAL | Status: DC

## 2013-04-07 NOTE — Progress Notes (Signed)
S:  SP cath, no CO O:BP 156/77  Pulse 75  Temp(Src) 98.6 F (37 C) (Oral)  Resp 18  Ht 5\' 2"  (1.575 m)  Wt 83.643 kg (184 lb 6.4 oz)  BMI 33.72 kg/m2  SpO2 97%  Intake/Output Summary (Last 24 hours) at 04/07/13 0931 Last data filed at 04/07/13 0630  Gross per 24 hour  Intake    927 ml  Output    600 ml  Net    327 ml   Weight change: 8.643 kg (19 lb 0.9 oz) [id number] and alert [id number] 3/6 systolic M LUSB Resp:clear Abd:+ BS NTND  TX LLQ NT Ext:no edema NEURO:CNI Ox3   . amiodarone  400 mg Oral BID  . cinacalcet  60 mg Oral Q48H  . dapsone  25 mg Oral Daily  . glipiZIDE  10 mg Oral BID AC  . influenza vac split quadrivalent PF  0.5 mL Intramuscular Tomorrow-1000  . insulin aspart  0-9 Units Subcutaneous TID WC  . magnesium oxide  800 mg Oral Q1200  . metoprolol tartrate  25 mg Oral BID  . mycophenolate  360 mg Oral BID  . [START ON 04/09/2013] pravastatin  40 mg Oral Q M,W,F  . sodium chloride  3 mL Intravenous Q12H  . tacrolimus  5 mg Oral BID  . triamcinolone cream  1 application Topical BID   Dg Chest 2 View  04/07/2013   CLINICAL DATA:  Aortic stenosis, preop  EXAM: CHEST  2 VIEW  COMPARISON:  04/05/2013  FINDINGS: Stable moderate cardiomegaly. Tortuous atheromatous thoracic aorta. No effusion. There is some thickening of or fluid in the interlobar fissures. No overt interstitial edema however. Mild central pulmonary vascular congestion. Regional bones unremarkable.  IMPRESSION: 1. Stable cardiomegaly. 2. Pulmonary vascular congestion without overt interstitial edema.   Electronically Signed   By: Arne Cleveland M.D.   On: 04/07/2013 03:45   Dg Chest 2 View  04/05/2013   CLINICAL DATA:  Heart catheterization preop examination  EXAM: CHEST  2 VIEW  COMPARISON:  Chest radiograph 12/15/2010  FINDINGS: Stable enlarged cardiac silhouette. There is mild central venous pulmonary congestion. No effusion, infiltrate, or pneumothorax. Calcification of the aorta is noted.   IMPRESSION: Cardiomegaly and central venous congestion. No overt pulmonary edema.   Electronically Signed   By: Suzy Bouchard M.D.   On: 04/05/2013 18:05   Ct Chest Wo Contrast  04/07/2013   CLINICAL DATA:  Query ascending thoracic aortic aneurysm. Atherosclerosis.  EXAM: CT CHEST WITHOUT CONTRAST  TECHNIQUE: Multidetector CT imaging of the chest was performed following the standard protocol without IV contrast.  COMPARISON:  04/05/2013 radiographs  FINDINGS: Aortic caliber 3.0 cm in the vicinity of the sinuses of Valsalva, 3.6 cm in the mid ascending aorta, 3.6 cm in the proximal arch, 3.6 cm in the distal arch, 3.5 cm in the descending aorta, and 3.1 cm at the hiatus. No ascending aortic aneurysm observed.  There is dense coronary artery atherosclerotic calcification along with calcification of the aortic and mitral valves. Small to moderate pericardial effusion. Mild cardiomegaly.  We visualize the upper pole the right kidney which appears atrophic with some punctate calcifications in the parenchyma, and a posterior hypodense lesion which is only partially characterized but which is not definitively simple cyst.  No pathologic thoracic adenopathy identified.  1.0 x 0.8 cm ground-glass nodule in the right upper lobe, image 12 of series 3.  Secondary pulmonary lobular interstitial thickening in the lung apices.  Mild mosaic attenuation in the  lungs with smaller appearing vessels and darker segment suggesting air trapping or small vessels disease as an etiology rather than airspace filling.  Thoracic spondylosis is present particularly at T7-8 where there is endplate sclerosis. There is patchy sclerosis in the sternum which being somewhat symmetric along the costosternal joints may be incidental. Subsegmental atelectasis or scarring observed in the lingula.  IMPRESSION: 1. No aortic aneurysm s crest and no thoracic aortic aneurysm observed. 2. Dense atherosclerotic calcification including the coronary  arteries. 3. Mild calcification of the aortic and mitral valves. 4. Small to moderate pericardial effusion. 5. Mild cardiomegaly, with upper zone the secondary pulmonary lobular interstitial accentuation suggesting borderline interstitial edema. 6. Ground-glass opacity nodule in the right upper lobe. Initial follow-up by chest CT without contrast is recommended in 3 months to confirm persistence. This recommendation follows the consensus statement: Recommendations for the Management of Subsolid Pulmonary Nodules Detected at CT: A Statement from the [employer] [place] as published in Radiology 2013; 266:304-317. 7. Mosaic attenuation in the lungs favoring air trapping or small vessels disease.   Electronically Signed   By: Sherryl Barters   On: 04/07/2013 07:35   BMET    Component Value Date/Time   NA 138 04/07/2013 0530   K 4.0 04/07/2013 0530   CL 106 04/07/2013 0530   CO2 22 04/07/2013 0530   GLUCOSE 152* 04/07/2013 0530   GLUCOSE 126* 06/13/2006 1525   BUN 27* 04/07/2013 0530   CREATININE 1.35* 04/07/2013 0530   CREATININE 1.35* 03/30/2013 1634   CALCIUM 8.7 04/07/2013 0530   CALCIUM 9.7 01/29/2008 1338   GFRNONAA 39* 04/07/2013 0530   GFRAA 45* 04/07/2013 0530   CBC    Component Value Date/Time   WBC 5.3 04/07/2013 0530   RBC 4.22 04/07/2013 0530   HGB 11.4* 04/07/2013 0530   HCT 35.6* 04/07/2013 0530   PLT 210 04/07/2013 0530   MCV 84.4 04/07/2013 0530   MCH 27.0 04/07/2013 0530   MCHC 32.0 04/07/2013 0530   RDW 15.6* 04/07/2013 0530   LYMPHSABS 0.8 05/25/2012 1442   MONOABS 0.7 05/25/2012 1442   EOSABS 0.1 05/25/2012 1442   BASOSABS 0.0 05/25/2012 1442     Assessment: 1. Renal transplant with CKD3 2. Aortic Stenosis 3. HTN 4. Hx stroke  Plan: 1. DC IV fluids 2.  Resume lasix po 3.  BMET in AM   Nakaiya Beddow T

## 2013-04-07 NOTE — Progress Notes (Addendum)
VASCULAR LAB PRELIMINARY  PRELIMINARY  PRELIMINARY  PRELIMINARY  Pre-op Cardiac Surgery  Carotid Findings:  Bilateral:  1-39% ICA stenosis.  Vertebral artery flow is antegrade.      Upper Extremity Right Left  Brachial Pressures >170 pt couldn't tolerate cuff tightening more  graft  Radial Waveforms biphasic   Ulnar Waveforms triphasic   Palmar Arch (Allen's Test) WNL    Findings:  Doppler waveforms remain normal with ulnar and radial compressions on the right.    Lower  Extremity Right Left  Dorsalis Pedis    Anterior Tibial 47 severely dampened monophasic 124 monophasic  Posterior Tibial 115 monophasic 119 severely dampened monophasic  Ankle/Brachial Indices      Findings:  ABI not ascertained bilaterally because the patient could not tolerate the cuff tightening on her right upper arm.   Miguelina Fore, RVT 04/07/2013, 11:31 AM

## 2013-04-07 NOTE — Progress Notes (Signed)
Patient ID: [name], female   DOB: September 16, 1945, 67 y.o.   MRN: [id number]    Primary cardiologist:  Subjective:   Patient with no compliants this morning   Objective:   Temp:  [98.6 F (37 C)-98.8 F (37.1 C)] 98.6 F (37 C) (09/20 0548) Pulse Rate:  [70-83] 75 (09/20 0548) Resp:  [18] 18 (09/20 0548) BP: (126-158)/(60-90) 156/77 mmHg (09/20 0548) SpO2:  [97 %-99 %] 97 % (09/20 0548) Weight:  [184 lb 6.4 oz (83.643 kg)] 184 lb 6.4 oz (83.643 kg) (09/20 0548) Last BM Date: 04/06/13  Filed Weights   04/05/13 1300 04/06/13 0427 04/07/13 0548  Weight: 165 lb 5.5 oz (75 kg) 184 lb (83.462 kg) 184 lb 6.4 oz (83.643 kg)    Intake/Output Summary (Last 24 hours) at 04/07/13 1128 Last data filed at 04/07/13 0915  Gross per 24 hour  Intake   1167 ml  Output    600 ml  Net    567 ml    Telemetry: Afib, elevated rates to 130-140s at times  Exam:  General:NAD  HEENT:sclera clear, pupils equal round and reactive  Lungs: CTAB  Cardiac:irreg, 4/6 systolic murmur mid to late peaking RUSB, no JVD  Abdomen:soft, NT, ND  Extremities:no LE edema  Lab Results:  Basic Metabolic Panel:  Recent Labs Lab 04/05/13 1406 04/06/13 0520 04/07/13 0530  NA 133* 145 138  K 4.2 4.1 4.0  CL 101 109 106  CO2 24 27 22   GLUCOSE 115* 104* 152*  BUN 30* 28* 27*  CREATININE 1.40* 1.24* 1.35*  CALCIUM 9.7 8.9 8.7    Liver Function Tests:  Recent Labs Lab 04/07/13 0530  AST 59*  ALT 61*  ALKPHOS 68  BILITOT 0.2*  PROT 5.7*  ALBUMIN 2.8*    CBC:  Recent Labs Lab 04/05/13 1406 04/06/13 0520 04/07/13 0530  WBC 6.0 5.8 5.3  HGB 13.4 12.8 11.4*  HCT 42.9 38.9 35.6*  MCV 84.0 82.1 84.4  PLT 232 232 210    Cardiac Enzymes: No results found for this basename: CKTOTAL, CKMB, CKMBINDEX, TROPONINI,  in the last 168 hours  BNP: No results found for this basename: PROBNP,  in the last 8760 hours  Coagulation:  Recent Labs Lab 04/05/13 1406 04/07/13 0530  INR  1.12 1.08       Medications:   Scheduled Medications: . amiodarone  400 mg Oral BID  . cinacalcet  60 mg Oral Q48H  . dapsone  25 mg Oral Daily  . furosemide  20 mg Oral Daily  . glipiZIDE  10 mg Oral BID AC  . insulin aspart  0-9 Units Subcutaneous TID WC  . magnesium oxide  800 mg Oral Q1200  . metoprolol tartrate  25 mg Oral BID  . mycophenolate  360 mg Oral BID  . [START ON 04/09/2013] pravastatin  40 mg Oral Q M,W,F  . sodium chloride  3 mL Intravenous Q12H  . tacrolimus  5 mg Oral BID  . triamcinolone cream  1 application Topical BID     Infusions: . heparin 800 Units/hr (04/07/13 1118)     PRN Medications:  sodium chloride, acetaminophen, diphenhydrAMINE, hydrALAZINE, labetalol, nitroGLYCERIN, ondansetron (ZOFRAN) IV, sodium chloride   04/05/13 LHC and RHC Procedural Findings:  Hemodynamics  RA 9  RV 49/8  PA 46/22 mean 35  PCWP v wave 41 mean 29  LV not recorded  AO 178/94  Oxygen saturations:  PA 62  AO 93  Cardiac Output (Fick) 4.54  Cardiac Index (  Fick) 2.47  Coronary angiography:  Coronary dominance: right  Left mainstem: Mildly calcified, widely patent. A rise from the left cusp it divides into the LAD and left circumflex.  Left anterior descending (LAD): Large-caliber vessel that reaches and wraps around the left ventricular apex. The LAD is moderately calcified. At the origin of the first diagonal there is 30-40% stenosis. There is no high-grade disease throughout the course of the LAD distribution. The diagonal branches are widely patent.  Left circumflex (LCx): The left circumflex is of large caliber. The vessel is widely patent throughout. He obtuse marginal branches are patent without significant stenoses.  Right coronary artery (RCA): The RCA is dominant. The vessel is heavily calcified. He RCA is totally occluded in the midportion within the previously stented segment. There is faint filling of the distal RCA via left to right collaterals. The  distal Gianfranco Araki vessels of the RCA appear very small with severe diffuse disease.  Left ventriculography: Deferred because of chronic kidney disease  Final Conclusions:  1. Patent left main, LAD, and left circumflex, with mild nonobstructive disease of these vessels  2. 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 Heavily calcified aortic valve under fluoroscopic evaluation in this patient with known severe aortic stenosis  4. Pulmonary hypertension, suspect secondary to left heart disease (transpulmonic gradient less than 10 mm mercury)   Echo 11/2012: LVEF 55-60%, AVA VTI 0.7, mean grad 40     Assessment/Plan   1. severe symptomatic AS-  - patient being evaluated for AVR - s/p LHC/RHC, single vessel disease RCA unclear if candidate for bypass - f/u CT surg for possible scheduling date.   2. Chronic stage III renal failure, hx of renal transplant - patient continued on current immunosuppresants - hydrated during recent cath, renal function remains stable - followed by renal, appreciate recommendations  3. Atrial fibrillation-patient remains in atrial fibrillation. She has had a previous embolic CVA.  - elevated rates overnight and this morning, on amio and metoprolol. Will increase metoprolol to 50mg  bid.  - being considered for Maze procedure during upcoming AVR  4. history of coronary artery disease-continue statin.   5 hypertension - elevated bp at times, increasing metop due to elevated HRs w/ afib, follow bp trends          Carlyle Dolly, M.D., F.A.C.C.

## 2013-04-07 NOTE — Progress Notes (Signed)
ANTICOAGULATION CONSULT NOTE - Follow Up  Pharmacy Consult for heparin Indication: h/o embolic CVA/Afib  Allergies  Allergen Reactions  . Ibuprofen Nausea And Vomiting  . Sulfamethoxazole W-Trimethoprim Swelling  . Sulfonamide Derivatives Swelling  . Tape Rash  . Tramadol Nausea And Vomiting  . Bactrim Swelling  . Red Dye     Patient Measurements: Height: 5\' 2"  (157.5 cm) Weight: 184 lb 6.4 oz (83.643 kg) IBW/kg (Calculated) : 50.1 Heparin Dosing Weight: 70.4kg  Vital Signs: Temp: 98.5 F (36.9 C) (09/20 1505) Temp src: Oral (09/20 1505) BP: 153/69 mmHg (09/20 1505) Pulse Rate: 88 (09/20 1505)  Labs:  Recent Labs  04/05/13 1406  04/06/13 0520 04/07/13 0530 04/07/13 1447  HGB 13.4  --  12.8 11.4*  --   HCT 42.9  --  38.9 35.6*  --   PLT 232  --  232 210  --   APTT  --   --   --  47*  --   LABPROT 14.2  --   --  13.8  --   INR 1.12  --   --  1.08  --   HEPARINUNFRC  --   < > 1.05* 0.18* 0.33  CREATININE 1.40*  --  1.24* 1.35*  --   < > = values in this interval not displayed.  Estimated Creatinine Clearance: 39.4 ml/min (by C-G formula based on Cr of 1.35).  Assessment: [name] planned direct admission for hydration pre-cath. She was on full dose Lovenox at home in preparation for cath. Heparin was resumed last pm after cath and heparin level this am is 0.18 units/ml. HL now therapeutic.   Goal of Therapy:  Heparin level 0.3-0.7 units/ml Monitor platelets by anticoagulation protocol: Yes   Plan:  1. Continue heparin drip  800 units/hr 2. Monitor daily Heparin level and CBC  Thank you, Vivia Ewing, PharmD Clinical Pharmacist - Resident Pager: [phone] Pharmacy: [phone] 04/07/2013 3:46 PM

## 2013-04-07 NOTE — Progress Notes (Addendum)
Pre op teaching for [facility] initiated. DBE, proper use of Incentive spirometry, Wound splinting, coughing exercise completed. Pt also instructed on infection control on incision site and proper use of pain medication, exercise/ambulation. Pt needs further follow-up . Pt received OHS booklet. Will watch OHS videos when family arrives per patient.

## 2013-04-07 NOTE — Progress Notes (Signed)
Pre and Post OHS video seen with husband.

## 2013-04-07 NOTE — Evaluation (Addendum)
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 Pt with severe aortic stenosis with plans for AVR surgery 9/24.   Clinical Impression  [name] presents to PT with below listed impairments impacting functional mobility and independence.  [name] has difficulty with sit<>stand without using upper extremities at this time due to generalized weakness in her legs. Will benefit physical therapy in the acute setting to maximize strength and activity tolerance for improved recovery time following surgery. I began education concerning sternal precautions and log roll for mobility following surgery. Encouraged her to ambulate with nursing and left RW in the room for her to use. It does seem to give her more stability and limit her exertion.     PT Assessment  Patient needs continued PT services    Follow Up Recommendations  Home health PT;Supervision for mobility/OOB    Does the patient have the potential to tolerate intense rehabilitation      Barriers to Discharge        Equipment Recommendations  Rolling walker with 5" wheels; may need hospital bed following surgery pending her ability to perform stairs as her only bedroom is on the 2nd floor (will let evaluating therapist following surgery make this decision)   Recommendations for Other Services     Frequency Min 3X/week    Precautions / Restrictions Precautions Precautions: None Restrictions Weight Bearing Restrictions:  No   Pertinent Vitals/Pain Denies pain, does become dyspneic with activity (DOE 2-3/4) especially when attempting to talk and walk requiring 2 standing rest breaks and limiting her talking, SpO2 remained 99% on RA      Mobility  Bed Mobility Bed Mobility: Not assessed Details for Bed Mobility Assistance: educated pt on log roll technique following surgery Transfers Transfers: Sit to Stand;Stand to Sit Sit to Stand: 5: Supervision;With upper extremity assist;4: Min guard;Without upper extremity assist Stand to Sit: 5: Supervision;With upper extremity assist;4: Min guard;Without upper extremity assist Details for Transfer Assistance: with upper extremity assist pt performing sit<>stand transfers with supervision, without upper extremity assist (in prep for sternal precautions) pt requiring closer gaurd and propells her body weight forward  Ambulation/Gait Ambulation/Gait Assistance: 4: Min guard;5: Supervision Ambulation Distance (Feet): 200 Feet Assistive device: Rolling walker;Straight cane Ambulation/Gait Assistance Details: good use of SPC in RUE however pt reaching for LUE support to improve stability in addition to [facility], pt then switched over to using RW by my recommendation with verbal and tactile cues for safe technique, cues throughout ambulation to limit talking while walking as pt became very dyspneic 3/4 needing to stp and rest and recover, SpO2 remained high 90s during activity Gait Pattern: Step-through pattern;Decreased stride length Gait velocity: decreased Stairs: No    Exercises     PT Diagnosis: Generalized weakness;Difficulty walking  PT Problem List: Decreased activity tolerance;Decreased balance PT Treatment Interventions: DME instruction;Gait training;Functional mobility training;Therapeutic activities;Therapeutic exercise;Patient/family education;Balance training;Neuromuscular re-education     PT Goals(Current goals can be found in the care plan section) Acute  Rehab PT Goals Patient  Stated Goal: to be active and independent PT Goal Formulation: With patient Time For Goal Achievement: 04/14/13 Potential to Achieve Goals: Good  Visit Information  Last PT Received On: 04/07/13 Assistance Needed: +1 History of Present Illness: [name] is a 67 y/o F with history of CAD s/p MI/RCA stent 2005, aortic stenosis, embolic CVA 123XX123, PAF on Coumadin, DM, pericardial effusion, and CKD (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 Pt with severe aortic stenosis with plans for AVR surgery 9/24.        Prior [place] expects to be discharged to:: Private residence Living Arrangements: Spouse/significant other Available Help at Discharge: Family;Available 24 hours/day Type of Home: House Home Access: Stairs to enter [employer] of Steps: 1 Home Layout: Two level;Bed/bath upstairs Alternate Level Stairs-Rails: Right Home Equipment: Cane - single point Prior Function Level of Independence: Independent with assistive device(s) Communication Communication: No difficulties (DOE)    Cognition  Cognition Arousal/Alertness: Awake/alert Behavior During Therapy: WFL for tasks assessed/performed Overall Cognitive Status: Within Functional Limits for tasks assessed    Extremity/Trunk Assessment Upper Extremity Assessment Upper Extremity Assessment: Overall WFL for tasks assessed Lower Extremity Assessment Lower Extremity Assessment: Generalized weakness   Balance    End of Session PT - End of Session Equipment Utilized During Treatment: Gait belt Activity Tolerance: Patient tolerated treatment well Patient left: in bed;with call bell/phone within reach Nurse Communication: Mobility status  GP     Lajuan Lines 04/07/2013, 1:29 PM

## 2013-04-07 NOTE — Progress Notes (Signed)
ANTICOAGULATION CONSULT NOTE - Follow Up  Pharmacy Consult for heparin Indication: h/o embolic CVA/Afib  Allergies  Allergen Reactions  . Ibuprofen Nausea And Vomiting  . Sulfamethoxazole W-Trimethoprim Swelling  . Sulfonamide Derivatives Swelling  . Tape Rash  . Tramadol Nausea And Vomiting  . Bactrim Swelling  . Red Dye     Patient Measurements: Height: 5\' 2"  (157.5 cm) Weight: 184 lb 6.4 oz (83.643 kg) IBW/kg (Calculated) : 50.1 Heparin Dosing Weight: 70.4kg  Vital Signs: Temp: 98.6 F (37 C) (09/20 0548) Temp src: Oral (09/20 0548) BP: 156/77 mmHg (09/20 0548) Pulse Rate: 75 (09/20 0548)  Labs:  Recent Labs  04/05/13 1406 04/05/13 2305 04/06/13 0520 04/07/13 0530  HGB 13.4  --  12.8  --   HCT 42.9  --  38.9  --   PLT 232  --  232  --   APTT  --   --   --  47*  LABPROT 14.2  --   --  13.8  INR 1.12  --   --  1.08  HEPARINUNFRC  --  1.24* 1.05* 0.18*  CREATININE 1.40*  --  1.24*  --     Estimated Creatinine Clearance: 42.9 ml/min (by C-G formula based on Cr of 1.24).   Medical History: Past Medical History  Diagnosis Date  . Coronary artery disease 05/2002    a. Ant MI 2003 s/p PTCA/stent to RCA.   Marland Kitchen Hypertension   . Hyperlipidemia   . CHF (congestive heart failure)   . Pericardial effusion     a. Small by echo 11/2011.  Marland Kitchen GERD (gastroesophageal reflux disease)   . 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 Unspecified hearing loss   . Anemia, iron deficiency     of chronic disease  . Helicobacter pylori (H. pylori) infection     hx of  . Esophagitis, reflux   . Gout   . Cholelithiasis   . Hx of colonic polyps   . Diverticulosis of colon   . 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  Marland Kitchen Chronic cough onset 03/2010    Dr Melvyn Novas  . Carotid artery disease     a. Carotid  Dopplers performed in August 2013 showed 40-59% left stenosis and 0-39% right; f/u recommended in 2 years.   . Aortic stenosis     a. Severe AS by echo 11/2012.  . Asystole     a. During ENT surgery 2005: developed marked asystole requiring CPR, felt due to vagal reaction (cath nonobst dz).  . Myocardial infarction 2003  . Cerebrovascular accident 2009    a. LMCA infarct felt embolic [id number], maintained on chronic coumadin.; denies residual on 04/05/2013  . Sleep apnea     Pt says testing was positive, intolerant of CPAP.  Marland Kitchen Type II diabetes mellitus   . ESRD (end stage renal disease)     a. Mass on L kidney per pt s/p nephrectomy - pt states not cancer - [facility] notes indicate ESRD due to HTN/DM - was previously on HD. b. Kidney transplant 02/2011.  . S/P kidney transplant 03/16/2011  . Chronic Persistent Atrial Fibrillation 12/31/2008    Qualifier: Diagnosis of  By: [name]     Assessment: [name] planned direct admission for hydration pre-cath. She was on full dose Lovenox at home in preparation for cath. Heparin was  resumed last pm after cath and heparin level this am is 0.18 units/ml  Goal of Therapy:  Heparin level 0.3-0.7 units/ml Monitor platelets by anticoagulation protocol: Yes   Plan:  1. Increase heparin drip to 800 units/hr 2. Check heparin level 8 hours after rate change.  [name], PharmD Clinical Pharmacist Pager: [phone] 04/07/2013 6:20 AM

## 2013-04-08 LAB — URINE MICROSCOPIC-ADD ON

## 2013-04-08 LAB — GLUCOSE, CAPILLARY
Glucose-Capillary: 149 mg/dL — ABNORMAL HIGH (ref 70–99)
Glucose-Capillary: 107 mg/dL — ABNORMAL HIGH (ref 70–99)
Glucose-Capillary: 139 mg/dL — ABNORMAL HIGH (ref 70–99)

## 2013-04-08 LAB — URINALYSIS, ROUTINE W REFLEX MICROSCOPIC
Bilirubin Urine: NEGATIVE
Ketones, ur: NEGATIVE mg/dL
Nitrite: NEGATIVE
Specific Gravity, Urine: 1.009 (ref 1.005–1.030)
Urobilinogen, UA: 0.2 mg/dL (ref 0.0–1.0)

## 2013-04-08 LAB — BASIC METABOLIC PANEL
BUN: 18 mg/dL (ref 6–23)
Chloride: 101 mEq/L (ref 96–112)
Creatinine, Ser: 1.06 mg/dL (ref 0.50–1.10)
GFR calc Af Amer: 61 mL/min — ABNORMAL LOW (ref >90)
GFR calc non Af Amer: 52 mL/min — ABNORMAL LOW (ref >90)
Sodium: 135 mEq/L (ref 135–145)

## 2013-04-08 LAB — CBC
HCT: 38 % (ref 36.0–46.0)
MCHC: 32.4 g/dL (ref 30.0–36.0)
MCV: 83.9 fL (ref 78.0–100.0)
RBC: 4.53 MIL/uL (ref 3.87–5.11)
RDW: 15.5 % (ref 11.5–15.5)

## 2013-04-08 LAB — URINE CULTURE

## 2013-04-08 LAB — HEPARIN LEVEL (UNFRACTIONATED)
Heparin Unfractionated: 0.18 IU/mL — ABNORMAL LOW (ref 0.30–0.70)
Heparin Unfractionated: 0.31 IU/mL (ref 0.30–0.70)

## 2013-04-08 NOTE — Progress Notes (Signed)
S:   no CO O:BP 120/80  Pulse 64  Temp(Src) 97.9 F (36.6 C) (Oral)  Resp 17  Ht 5\' 2"  (1.575 m)  Wt 82.555 kg (182 lb)  BMI 33.28 kg/m2  SpO2 100%  Intake/Output Summary (Last 24 hours) at 04/08/13 0853 Last data filed at 04/08/13 0840  Gross per 24 hour  Intake   1080 ml  Output    200 ml  Net    880 ml   Weight change: -1.089 kg (-2 lb 6.4 oz) [id number] and alert [id number] 3/6 systolic M LUSB Resp:clear Abd:+ BS NTND  TX LLQ NT Ext:no edema NEURO:CNI Ox3   . amiodarone  400 mg Oral BID  . cinacalcet  60 mg Oral Q48H  . dapsone  25 mg Oral Daily  . furosemide  20 mg Oral Daily  . glipiZIDE  10 mg Oral BID AC  . insulin aspart  0-9 Units Subcutaneous TID WC  . magnesium oxide  800 mg Oral Q1200  . metoprolol tartrate  50 mg Oral BID  . mycophenolate  360 mg Oral BID  . [START ON 04/09/2013] pravastatin  40 mg Oral Q M,W,F  . sodium chloride  3 mL Intravenous Q12H  . tacrolimus  5 mg Oral BID  . triamcinolone cream  1 application Topical BID   Dg Chest 2 View  04/07/2013   CLINICAL DATA:  Aortic stenosis, preop  EXAM: CHEST  2 VIEW  COMPARISON:  04/05/2013  FINDINGS: Stable moderate cardiomegaly. Tortuous atheromatous thoracic aorta. No effusion. There is some thickening of or fluid in the interlobar fissures. No overt interstitial edema however. Mild central pulmonary vascular congestion. Regional bones unremarkable.  IMPRESSION: 1. Stable cardiomegaly. 2. Pulmonary vascular congestion without overt interstitial edema.   Electronically Signed   By: Arne Cleveland M.D.   On: 04/07/2013 03:45   Ct Chest Wo Contrast  04/07/2013   CLINICAL DATA:  Query ascending thoracic aortic aneurysm. Atherosclerosis.  EXAM: CT CHEST WITHOUT CONTRAST  TECHNIQUE: Multidetector CT imaging of the chest was performed following the standard protocol without IV contrast.  COMPARISON:  04/05/2013 radiographs  FINDINGS: Aortic caliber 3.0 cm in the vicinity of the sinuses of Valsalva, 3.6  cm in the mid ascending aorta, 3.6 cm in the proximal arch, 3.6 cm in the distal arch, 3.5 cm in the descending aorta, and 3.1 cm at the hiatus. No ascending aortic aneurysm observed.  There is dense coronary artery atherosclerotic calcification along with calcification of the aortic and mitral valves. Small to moderate pericardial effusion. Mild cardiomegaly.  We visualize the upper pole the right kidney which appears atrophic with some punctate calcifications in the parenchyma, and a posterior hypodense lesion which is only partially characterized but which is not definitively simple cyst.  No pathologic thoracic adenopathy identified.  1.0 x 0.8 cm ground-glass nodule in the right upper lobe, image 12 of series 3.  Secondary pulmonary lobular interstitial thickening in the lung apices.  Mild mosaic attenuation in the lungs with smaller appearing vessels and darker segment suggesting air trapping or small vessels disease as an etiology rather than airspace filling.  Thoracic spondylosis is present particularly at T7-8 where there is endplate sclerosis. There is patchy sclerosis in the sternum which being somewhat symmetric along the costosternal joints may be incidental. Subsegmental atelectasis or scarring observed in the lingula.  IMPRESSION: 1. No aortic aneurysm s crest and no thoracic aortic aneurysm observed. 2. Dense atherosclerotic calcification including the coronary arteries. 3. Mild calcification of the  aortic and mitral valves. 4. Small to moderate pericardial effusion. 5. Mild cardiomegaly, with upper zone the secondary pulmonary lobular interstitial accentuation suggesting borderline interstitial edema. 6. Ground-glass opacity nodule in the right upper lobe. Initial follow-up by chest CT without contrast is recommended in 3 months to confirm persistence. This recommendation follows the consensus statement: Recommendations for the Management of Subsolid Pulmonary Nodules Detected at CT: A Statement  from the [place] as published in Radiology 2013; 266:304-317. 7. Mosaic attenuation in the lungs favoring air trapping or small vessels disease.   Electronically Signed   By: Sherryl Barters   On: 04/07/2013 07:35   BMET    Component Value Date/Time   NA 135 04/08/2013 0545   K 3.8 04/08/2013 0545   CL 101 04/08/2013 0545   CO2 23 04/08/2013 0545   GLUCOSE 156* 04/08/2013 0545   GLUCOSE 126* 06/13/2006 1525   BUN 18 04/08/2013 0545   CREATININE 1.06 04/08/2013 0545   CREATININE 1.35* 03/30/2013 1634   CALCIUM 9.1 04/08/2013 0545   CALCIUM 9.7 01/29/2008 1338   GFRNONAA 52* 04/08/2013 0545   GFRAA 61* 04/08/2013 0545   CBC    Component Value Date/Time   WBC 7.0 04/08/2013 0545   RBC 4.53 04/08/2013 0545   HGB 12.3 04/08/2013 0545   HCT 38.0 04/08/2013 0545   PLT 224 04/08/2013 0545   MCV 83.9 04/08/2013 0545   MCH 27.2 04/08/2013 0545   MCHC 32.4 04/08/2013 0545   RDW 15.5 04/08/2013 0545   LYMPHSABS 0.8 05/25/2012 1442   MONOABS 0.7 05/25/2012 1442   EOSABS 0.1 05/25/2012 1442   BASOSABS 0.0 05/25/2012 1442     Assessment: 1. Renal transplant with CKD3 2. Aortic Stenosis 3. HTN 4. Hx stroke  Plan: 1. Renal fx has done well post cath.  Will sign off.  Call back if goes to surgery and need help in the post op period  Kaislee Chao T

## 2013-04-08 NOTE — Progress Notes (Addendum)
ANTICOAGULATION CONSULT NOTE - Follow Up  Pharmacy Consult for heparin Indication: h/o embolic CVA/Afib  Allergies  Allergen Reactions  . Ibuprofen Nausea And Vomiting  . Sulfamethoxazole W-Trimethoprim Swelling  . Sulfonamide Derivatives Swelling  . Tape Rash  . Tramadol Nausea And Vomiting  . Bactrim Swelling  . Red Dye     Patient Measurements: Height: 5\' 2"  (157.5 cm) Weight: 182 lb (82.555 kg) IBW/kg (Calculated) : 50.1 Heparin Dosing Weight: 70.4kg  Vital Signs: Temp: 99 F (37.2 C) (09/21 1404) Temp src: Oral (09/21 1404) BP: 150/81 mmHg (09/21 1404) Pulse Rate: 73 (09/21 1404)  Labs:  Recent Labs  04/06/13 0520 04/07/13 0530 04/07/13 1447 04/08/13 0545 04/08/13 1500  HGB 12.8 11.4*  --  12.3  --   HCT 38.9 35.6*  --  38.0  --   PLT 232 210  --  224  --   APTT  --  47*  --   --   --   LABPROT  --  13.8  --   --   --   INR  --  1.08  --   --   --   HEPARINUNFRC 1.05* 0.18* 0.33 0.18* 0.31  CREATININE 1.24* 1.35*  --  1.06  --     Estimated Creatinine Clearance: 49.9 ml/min (by C-G formula based on Cr of 1.06).   Assessment: [name] planned direct admission for hydration pre-cath. She was on full dose Lovenox at home in preparation for cath. Heparin level therapeutic now.    Goal of Therapy:  Heparin level 0.3-0.7 units/ml Monitor platelets by anticoagulation protocol: Yes   Plan:  1. Continue heparin drip 1000 units/hr 2. 8 hr heparin level to confirm since barely therapeutic 3. Monitor daily HL and CBC  Thank you, Vivia Ewing, PharmD Clinical Pharmacist - Resident Pager: [phone] Pharmacy: [phone] 04/08/2013 4:09 PM

## 2013-04-08 NOTE — Progress Notes (Signed)
[name], female   DOB: 05-Feb-1946, 67 y.o.   MRN: [id number] Subjective:  No chest pain or sob. "I wish I could get some sleep"  Objective:  Vital Signs in the last 24 hours: Temp:  [97.9 F (36.6 C)-99.6 F (37.6 C)] 97.9 F (36.6 C) (09/21 0510) Pulse Rate:  [61-88] 64 (09/21 0742) Resp:  [17] 17 (09/21 0510) BP: (120-183)/(69-83) 120/80 mmHg (09/21 0742) SpO2:  [97 %-100 %] 100 % (09/21 0510) Weight:  [182 lb (82.555 kg)] 182 lb (82.555 kg) (09/21 0510)  Intake/Output from previous day: 09/20 0701 - 09/21 0700 In: 720 [P.O.:720] Out: 200 [Urine:200] Intake/Output from this shift: Total I/O In: 360 [P.O.:360] Out: -   Physical Exam: Well appearing NAD HEENT: Unremarkable Neck:  No JVD, no thyromegally Back:  No CVA tenderness Lungs:  Clear with no wheezes HEART:  Regular rate rhythm, 2/6 systolic murmur, no rubs, no clicks Abd:  obese, positive bowel sounds, no organomegally, no rebound, no guarding Ext:  2 plus pulses, no edema, no cyanosis, no clubbing Skin:  No rashes no nodules Neuro:  CN II through XII intact, motor grossly intact  Lab Results:  Recent Labs  04/07/13 0530 04/08/13 0545  WBC 5.3 7.0  HGB 11.4* 12.3  PLT 210 224    Recent Labs  04/07/13 0530 04/08/13 0545  NA 138 135  K 4.0 3.8  CL 106 101  CO2 22 23  GLUCOSE 152* 156*  BUN 27* 18  CREATININE 1.35* 1.06   No results found for this basename: TROPONINI, CK, MB,  in the last 72 hours Hepatic Function Panel  Recent Labs  04/07/13 0530  PROT 5.7*  ALBUMIN 2.8*  AST 59*  ALT 61*  ALKPHOS 68  BILITOT 0.2*    Recent Labs  04/06/13 0520  CHOL 164   No results found for this basename: PROTIME,  in the last 72 hours  Imaging: Dg Chest 2 View  04/07/2013   CLINICAL DATA:  Aortic stenosis, preop  EXAM: CHEST  2 VIEW  COMPARISON:  04/05/2013  FINDINGS: Stable moderate cardiomegaly. Tortuous atheromatous thoracic aorta. No effusion. There is some thickening of  or fluid in the interlobar fissures. No overt interstitial edema however. Mild central pulmonary vascular congestion. Regional bones unremarkable.  IMPRESSION: 1. Stable cardiomegaly. 2. Pulmonary vascular congestion without overt interstitial edema.   Electronically Signed   By: Arne Cleveland M.D.   On: 04/07/2013 03:45   Ct Chest Wo Contrast  04/07/2013   CLINICAL DATA:  Query ascending thoracic aortic aneurysm. Atherosclerosis.  EXAM: CT CHEST WITHOUT CONTRAST  TECHNIQUE: Multidetector CT imaging of the chest was performed following the standard protocol without IV contrast.  COMPARISON:  04/05/2013 radiographs  FINDINGS: Aortic caliber 3.0 cm in the vicinity of the sinuses of Valsalva, 3.6 cm in the mid ascending aorta, 3.6 cm in the proximal arch, 3.6 cm in the distal arch, 3.5 cm in the descending aorta, and 3.1 cm at the hiatus. No ascending aortic aneurysm observed.  There is dense coronary artery atherosclerotic calcification along with calcification of the aortic and mitral valves. Small to moderate pericardial effusion. Mild cardiomegaly.  We visualize the upper pole the right kidney which appears atrophic with some punctate calcifications in the parenchyma, and a posterior hypodense lesion which is only partially characterized but which is not definitively simple cyst.  No pathologic thoracic adenopathy identified.  1.0 x 0.8 cm ground-glass nodule in the right upper lobe, image 12 of series  3.  Secondary pulmonary lobular interstitial thickening in the lung apices.  Mild mosaic attenuation in the lungs with smaller appearing vessels and darker segment suggesting air trapping or small vessels disease as an etiology rather than airspace filling.  Thoracic spondylosis is present particularly at T7-8 where there is endplate sclerosis. There is patchy sclerosis in the sternum which being somewhat symmetric along the costosternal joints may be incidental. Subsegmental atelectasis or scarring observed in  the lingula.  IMPRESSION: 1. No aortic aneurysm s crest and no thoracic aortic aneurysm observed. 2. Dense atherosclerotic calcification including the coronary arteries. 3. Mild calcification of the aortic and mitral valves. 4. Small to moderate pericardial effusion. 5. Mild cardiomegaly, with upper zone the secondary pulmonary lobular interstitial accentuation suggesting borderline interstitial edema. 6. Ground-glass opacity nodule in the right upper lobe. Initial follow-up by chest CT without contrast is recommended in 3 months to confirm persistence. This recommendation follows the consensus statement: Recommendations for the Management of Subsolid Pulmonary Nodules Detected at CT: A Statement from the [place] as published in Radiology 2013; 266:304-317. 7. Mosaic attenuation in the lungs favoring air trapping or small vessels disease.   Electronically Signed   By: Sherryl Barters   On: 04/07/2013 07:35    Cardiac Studies: Tele - atrial fibrillation with a controlled VR Assessment/Plan:  1. Aortic stenosis 2. CAD 3. Atrial fibrillation Rec: She is currently stable and tolerating her medical therapy. Note plans for AVR/CABG/MAZE later this week. No change of meds at this point.  LOS: 3 days    Sophya Vanblarcom,M.D. 04/08/2013, 11:27 AM

## 2013-04-08 NOTE — Progress Notes (Signed)
ANTICOAGULATION CONSULT NOTE - Follow Up  Pharmacy Consult for heparin Indication: h/o embolic CVA/Afib  Allergies  Allergen Reactions  . Ibuprofen Nausea And Vomiting  . Sulfamethoxazole W-Trimethoprim Swelling  . Sulfonamide Derivatives Swelling  . Tape Rash  . Tramadol Nausea And Vomiting  . Bactrim Swelling  . Red Dye     Patient Measurements: Height: 5\' 2"  (157.5 cm) Weight: 182 lb (82.555 kg) IBW/kg (Calculated) : 50.1 Heparin Dosing Weight: 70.4kg  Vital Signs: Temp: 97.9 F (36.6 C) (09/21 0510) Temp src: Oral (09/21 0510) BP: 156/83 mmHg (09/21 0510) Pulse Rate: 61 (09/21 0510)  Labs:  Recent Labs  04/05/13 1406  04/06/13 0520 04/07/13 0530 04/07/13 1447 04/08/13 0545  HGB 13.4  --  12.8 11.4*  --  12.3  HCT 42.9  --  38.9 35.6*  --  38.0  PLT 232  --  232 210  --  224  APTT  --   --   --  47*  --   --   LABPROT 14.2  --   --  13.8  --   --   INR 1.12  --   --  1.08  --   --   HEPARINUNFRC  --   < > 1.05* 0.18* 0.33 0.18*  CREATININE 1.40*  --  1.24* 1.35*  --   --   < > = values in this interval not displayed.  Estimated Creatinine Clearance: 39.2 ml/min (by C-G formula based on Cr of 1.35).   Medical History: Past Medical History  Diagnosis Date  . Coronary artery disease 05/2002    a. Ant MI 2003 s/p PTCA/stent to RCA.   Marland Kitchen Hypertension   . Hyperlipidemia   . CHF (congestive heart failure)   . Pericardial effusion     a. Small by echo 11/2011.  Marland Kitchen GERD (gastroesophageal reflux disease)   . 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 Unspecified hearing loss   . Anemia, iron deficiency     of chronic disease  . Helicobacter pylori (H. pylori) infection     hx of  . Esophagitis, reflux   . Gout   . Cholelithiasis   . Hx of colonic polyps   . Diverticulosis of colon   . 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  Marland Kitchen Chronic cough onset 03/2010    Dr Melvyn Novas  . Carotid artery disease     a. Carotid Dopplers performed in August 2013 showed 40-59% left stenosis and 0-39% right; f/u recommended in 2 years.   . Aortic stenosis     a. Severe AS by echo 11/2012.  . Asystole     a. During ENT surgery 2005: developed marked asystole requiring CPR, felt due to vagal reaction (cath nonobst dz).  . Myocardial infarction 2003  . Cerebrovascular accident 2009    a. LMCA infarct felt embolic [id number], maintained on chronic coumadin.; denies residual on 04/05/2013  . Sleep apnea     Pt says testing was positive, intolerant of CPAP.  Marland Kitchen Type II diabetes mellitus   . ESRD (end stage renal disease)     a. Mass on L kidney per pt s/p nephrectomy - pt states not cancer - [facility] notes indicate ESRD due to HTN/DM - was previously on HD. b. Kidney transplant 02/2011.  . S/P kidney transplant 03/16/2011  . Chronic  Persistent Atrial Fibrillation 12/31/2008    Qualifier: Diagnosis of  By: [name]     Assessment: [name] planned direct admission for hydration pre-cath. She was on full dose Lovenox at home in preparation for cath. Heparin level this am is 0.18 units/ml.  No problems with infusion per RN.  Goal of Therapy:  Heparin level 0.3-0.7 units/ml Monitor platelets by anticoagulation protocol: Yes   Plan:  1. Increase heparin drip to 1000 units/hr 2. Check heparin level 8 hours after rate change.  Excell Seltzer, PharmD Clinical Pharmacist 04/08/2013 6:48 AM

## 2013-04-09 ENCOUNTER — Inpatient Hospital Stay (HOSPITAL_COMMUNITY): Payer: Medicare Other

## 2013-04-09 ENCOUNTER — Telehealth: Payer: Self-pay | Admitting: Cardiology

## 2013-04-09 DIAGNOSIS — I359 Nonrheumatic aortic valve disorder, unspecified: Secondary | ICD-10-CM

## 2013-04-09 LAB — HEPARIN LEVEL (UNFRACTIONATED): Heparin Unfractionated: 0.37 IU/mL (ref 0.30–0.70)

## 2013-04-09 LAB — CBC
Hemoglobin: 13.4 g/dL (ref 12.0–15.0)
MCHC: 32.5 g/dL (ref 30.0–36.0)
RBC: 4.92 MIL/uL (ref 3.87–5.11)
WBC: 7.2 10*3/uL (ref 4.0–10.5)

## 2013-04-09 LAB — GLUCOSE, CAPILLARY
Glucose-Capillary: 183 mg/dL — ABNORMAL HIGH (ref 70–99)
Glucose-Capillary: 106 mg/dL — ABNORMAL HIGH (ref 70–99)

## 2013-04-09 MED ORDER — ALBUTEROL SULFATE (5 MG/ML) 0.5% IN NEBU
2.5000 mg | INHALATION_SOLUTION | Freq: Once | RESPIRATORY_TRACT | Status: AC
  Administered 2013-04-09: 2.5 mg via RESPIRATORY_TRACT

## 2013-04-09 NOTE — Progress Notes (Signed)
[address]       [place] [address]             [phone]     CARDIOTHORACIC SURGERY PROGRESS NOTE  3 Days Post-Op  S/P Procedure(s) (LRB): LEFT AND RIGHT HEART CATHETERIZATION WITH CORONARY ANGIOGRAM (N/A)  Subjective: No complaints except for problems related to bleeding and hematoma in her left lower abdomen related to Lovenox injection.  Didn't like taking one of her medications (amiodarone?) but is still taking all of them  Objective: Vital signs in last 24 hours: Temp:  [98.3 F (36.8 C)-99.6 F (37.6 C)] 98.3 F (36.8 C) (09/22 0500) Pulse Rate:  [66-76] 66 (09/22 0500) Cardiac Rhythm:  [-] Atrial fibrillation (09/21 1930) Resp:  [16-18] 16 (09/22 0500) BP: (150-174)/(81-84) 173/81 mmHg (09/22 0500) SpO2:  [96 %-98 %] 96 % (09/22 0500) Weight:  [83.416 kg (183 lb 14.4 oz)] 83.416 kg (183 lb 14.4 oz) (09/22 0630)  Physical Exam:  Rhythm:   Atrial fibrillation  Breath sounds: clear  Heart sounds:  Irregular rate and rhythm with systolic murmur  Incisions:  n/a  Abdomen:  Soft nontender, firm hematoma across lower abdomen  Extremities:  Warm and well-perfused   Intake/Output from previous day: 09/21 0701 - 09/22 0700 In: 720 [P.O.:720] Out: 800 [Urine:800] Intake/Output this shift: Total I/O In: -  Out: 700 [Urine:700]  Lab Results:  Recent Labs  04/07/13 0530 04/08/13 0545  WBC 5.3 7.0  HGB 11.4* 12.3  HCT 35.6* 38.0  PLT 210 224   BMET:  Recent Labs  04/07/13 0530 04/08/13 0545  NA 138 135  K 4.0 3.8  CL 106 101  CO2 22 23  GLUCOSE 152* 156*  BUN 27* 18  CREATININE 1.35* 1.06  CALCIUM 8.7 9.1    CBG (last 3)   Recent Labs  04/08/13 1655 04/08/13 1952 04/09/13 0836  GLUCAP 107* 139* 151*   PT/INR:   Recent Labs  04/07/13 0530  LABPROT 13.8  INR 1.08    CT Chest:  CLINICAL DATA: Query ascending thoracic aortic aneurysm.  Atherosclerosis.  EXAM:  CT CHEST WITHOUT CONTRAST  TECHNIQUE:    Multidetector CT imaging of the chest was performed following the  standard protocol without IV contrast.  COMPARISON: 04/05/2013 radiographs  FINDINGS:  Aortic caliber 3.0 cm in the vicinity of the sinuses of Valsalva,  3.6 cm in the mid ascending aorta, 3.6 cm in the proximal arch, 3.6  cm in the distal arch, 3.5 cm in the descending aorta, and 3.1 cm at  the hiatus. No ascending aortic aneurysm observed.  There is dense coronary artery atherosclerotic calcification along  with calcification of the aortic and mitral valves. Small to  moderate pericardial effusion. Mild cardiomegaly.  We visualize the upper pole the right kidney which appears atrophic  with some punctate calcifications in the parenchyma, and a posterior  hypodense lesion which is only partially characterized but which is  not definitively simple cyst.  No pathologic thoracic adenopathy identified.  1.0 x 0.8 cm ground-glass nodule in the right upper lobe, image 12  of series 3.  Secondary pulmonary lobular interstitial thickening in the lung  apices.  Mild mosaic attenuation in the lungs with smaller appearing vessels  and darker segment suggesting air trapping or small vessels disease  as an etiology rather than airspace filling.  Thoracic spondylosis is present particularly at T7-8 where there is  endplate sclerosis. There is patchy sclerosis in the sternum which  being somewhat symmetric along the costosternal joints may be  incidental. Subsegmental atelectasis or scarring observed in the  lingula.  IMPRESSION:  1. No aortic aneurysm s crest and no thoracic aortic aneurysm  observed.  2. Dense atherosclerotic calcification including the coronary  arteries.  3. Mild calcification of the aortic and mitral valves.  4. Small to moderate pericardial effusion.  5. Mild cardiomegaly, with upper zone the secondary pulmonary  lobular interstitial accentuation suggesting borderline interstitial  edema.  6.  Ground-glass opacity nodule in the right upper lobe. Initial  follow-up by chest CT without contrast is recommended in 3 months to  confirm persistence. This recommendation follows the consensus  statement: Recommendations for the Management of Subsolid Pulmonary  Nodules Detected at CT: A Statement from the [place] as  published in Radiology 2013; 266:304-317.  7. Mosaic attenuation in the lungs favoring air trapping or small  vessels disease.  Electronically Signed  By: Sherryl Barters  On: 04/07/2013 07:35   Assessment/Plan: S/P Procedure(s) (LRB): LEFT AND RIGHT HEART CATHETERIZATION WITH CORONARY ANGIOGRAM (N/A)  Patient remains clinically stable and renal function has not changed since cardiac catheterization. CT scan of the chest demonstrates severely calcified transverse aortic arch and descending thoracic aorta, but the ascending thoracic aorta is much less affected. There is a very small "ground glass opacity nodule" noted in the right upper lobe which appears benign but will require long-term followup.  Tentatively for aortic valve replacement and Maze procedure on Wednesday.  [name],[name] 04/09/2013 10:33 AM

## 2013-04-09 NOTE — Progress Notes (Signed)
CARDIAC REHAB PHASE I   PRE:  Rate/Rhythm: 88 Afib  BP:  Supine:   Sitting: 144/80  Standing:    SaO2: 98 RA  MODE:  Ambulation: 500 ft   POST:  Rate/Rhythm: 70  BP:  Supine:   Sitting: 148/90  Standing:    SaO2: 99 RA 1005-1115 O arrival lab in room attempting to draw blood. Pt upset and c/o of to many needle sticks and big needles. Lab was only able to collect CBC. I reported this to her RN. Pt states that she is so tired of being stuck and wants something done. Assisted X 1 and used cane to ambulate. Gait steady with walker. VS stable. Pt was able to walk 500 feet without c/o. Pt to recliner after walk with call light in reach. She has watched [facility] video and has pre-op booklet. I encouraged use of IS and reinforced teaching.   Rodney Langton RN 04/09/2013 11:13 AM

## 2013-04-09 NOTE — Progress Notes (Signed)
ANTICOAGULATION CONSULT NOTE - Follow Up  Pharmacy Consult for heparin Indication: h/o embolic CVA/Afib  Allergies  Allergen Reactions  . Ibuprofen Nausea And Vomiting  . Sulfamethoxazole W-Trimethoprim Swelling  . Sulfonamide Derivatives Swelling  . Tape Rash  . Tramadol Nausea And Vomiting  . Bactrim Swelling  . Red Dye     Patient Measurements: Height: 5\' 2"  (157.5 cm) Weight: 182 lb (82.555 kg) IBW/kg (Calculated) : 50.1 Heparin Dosing Weight: 70.4kg  Vital Signs: Temp: 99.6 F (37.6 C) (09/21 2015) Temp src: Oral (09/21 2015) BP: 174/84 mmHg (09/21 2015) Pulse Rate: 76 (09/21 2015)  Labs:  Recent Labs  04/06/13 0520 04/07/13 0530  04/08/13 0545 04/08/13 1500 04/09/13 0050  HGB 12.8 11.4*  --  12.3  --   --   HCT 38.9 35.6*  --  38.0  --   --   PLT 232 210  --  224  --   --   APTT  --  47*  --   --   --   --   LABPROT  --  13.8  --   --   --   --   INR  --  1.08  --   --   --   --   HEPARINUNFRC 1.05* 0.18*  < > 0.18* 0.31 0.37  CREATININE 1.24* 1.35*  --  1.06  --   --   < > = values in this interval not displayed.  Estimated Creatinine Clearance: 49.9 ml/min (by C-G formula based on Cr of 1.06).   Assessment: [name] planned direct admission for hydration pre-cath. She was on full dose Lovenox at home in preparation for cath. Heparin level therapeutic.  Goal of Therapy:  Heparin level 0.3-0.7 units/ml Monitor platelets by anticoagulation protocol: Yes   Plan:  1. Continue heparin drip 1000 units/hr 2. F/u after cath  Thank you, [place] Pharmacist  Pharmacy: [phone] 04/09/2013 1:37 AM

## 2013-04-09 NOTE — Progress Notes (Signed)
ANTICOAGULATION CONSULT NOTE - Follow Up Consult  Pharmacy Consult for Heparin Indication: atrial fibrillation, h/o embolic CVA, awaiting AVR/Maze  Allergies  Allergen Reactions  . Ibuprofen Nausea And Vomiting  . Sulfamethoxazole W-Trimethoprim Swelling  . Sulfonamide Derivatives Swelling  . Tape Rash  . Tramadol Nausea And Vomiting  . Bactrim Swelling  . Red Dye     Patient Measurements: Height: 5\' 2"  (157.5 cm) Weight: 183 lb 14.4 oz (83.416 kg) IBW/kg (Calculated) : 50.1 Heparin Dosing Weight: 70 kg  Vital Signs: Temp: 98.3 F (36.8 C) (09/22 0500) BP: 173/81 mmHg (09/22 0500) Pulse Rate: 66 (09/22 0500)  Labs:  Recent Labs  04/07/13 0530  04/08/13 0545 04/08/13 1500 04/09/13 0050  HGB 11.4*  --  12.3  --   --   HCT 35.6*  --  38.0  --   --   PLT 210  --  224  --   --   APTT 47*  --   --   --   --   LABPROT 13.8  --   --   --   --   INR 1.08  --   --   --   --   HEPARINUNFRC 0.18*  < > 0.18* 0.31 0.37  CREATININE 1.35*  --  1.06  --   --   < > = values in this interval not displayed.  Estimated Creatinine Clearance: 50.1 ml/min (by C-G formula based on Cr of 1.06).   Medications:  Heparin at 1000 units/hr  Assessment: 67 y/o F s/p cath awaiting AVR/Maze on 9/24 who continues on heparin. HL is 0.37<0.31 on 1000 units/hr of heparin. CBC good from 9/21. Renal function stable. No overt bleeding noted.   Goal of Therapy:  Heparin level 0.3-0.7 units/ml Monitor platelets by anticoagulation protocol: Yes   Plan:  -Continue heparin at 1000 units/hr -Daily CBC/HL -Monitor for bleeding -Awaiting AVR/Maze on 9/24  Thank you for allowing me to take part in this patient's care,  Narda Bonds, PharmD Clinical Pharmacist Phone: [phone] Pager: [phone] 04/09/2013 10:06 AM

## 2013-04-09 NOTE — Telephone Encounter (Signed)
Pt's husband , [name], calling re status of letter for his dtr that lives in [place] to be able to come in to care for her after her sugery, pls call

## 2013-04-09 NOTE — Telephone Encounter (Signed)
Following up  Pt's husband called to follow up on having a form completed so that his daughter may come and provide care while the mother is in recovery from surgery.

## 2013-04-09 NOTE — Telephone Encounter (Signed)
Spoke with pt husband, aware FMLA placed in medical records for faxing.

## 2013-04-09 NOTE — Progress Notes (Addendum)
   Subjective:  Denies CP or dyspnea   Objective:  Filed Vitals:   04/08/13 1404 04/08/13 2015 04/09/13 0500 04/09/13 0630  BP: 150/81 174/84 173/81   Pulse: 73 76 66   Temp: 99 F (37.2 C) 99.6 F (37.6 C) 98.3 F (36.8 C)   TempSrc: Oral Oral    Resp: 18 17 16    Height:      Weight:    183 lb 14.4 oz (83.416 kg)  SpO2: 98% 96% 96%     Intake/Output from previous day:  Intake/Output Summary (Last 24 hours) at 04/09/13 0737 Last data filed at 04/09/13 0308  Gross per 24 hour  Intake    720 ml  Output    800 ml  Net    -80 ml    Physical Exam: Physical exam: Well-developed well-nourished in no acute distress.  Skin is warm and dry.  HEENT is normal.  Neck is supple.  Chest is clear to auscultation with normal expansion.  Cardiovascular exam is regular rate and rhythm. 3/6 systolic murmur Abdominal exam nontender or distended. No masses palpated. Extremities show no edema. neuro grossly intact    Lab Results: Basic Metabolic Panel:  Recent Labs  04/07/13 0530 04/08/13 0545  NA 138 135  K 4.0 3.8  CL 106 101  CO2 22 23  GLUCOSE 152* 156*  BUN 27* 18  CREATININE 1.35* 1.06  CALCIUM 8.7 9.1   CBC:  Recent Labs  04/07/13 0530 04/08/13 0545  WBC 5.3 7.0  HGB 11.4* 12.3  HCT 35.6* 38.0  MCV 84.4 83.9  PLT 210 224    Assessment/Plan:  1 severe symptomatic AS- for AVR/CABG/Maze Wed. 2 chronic stage III renal failure-patient is status post renal transplant. Repeat BMET in AM. 3 atrial fibrillation-patient remains in atrial fibrillation. She has had a previous embolic CVA. Continue heparin. Continue beta blocker. Follow heart rate on telemetry. Some nausea with amiodarone and DCed; will reattempt. 4 history of coronary artery disease-continue statin. 5 hypertension-continue metoprolol. 6 Abnormal Chest CT -fu chest CT 3 months [name] 04/09/2013, 7:37 AM

## 2013-04-10 ENCOUNTER — Encounter (HOSPITAL_COMMUNITY): Payer: Self-pay | Admitting: Thoracic Surgery (Cardiothoracic Vascular Surgery)

## 2013-04-10 LAB — CBC
HCT: 36.1 % (ref 36.0–46.0)
Hemoglobin: 11.8 g/dL — ABNORMAL LOW (ref 12.0–15.0)
MCV: 82.2 fL (ref 78.0–100.0)
Platelets: 230 10*3/uL (ref 150–400)
WBC: 6.5 10*3/uL (ref 4.0–10.5)

## 2013-04-10 LAB — GLUCOSE, CAPILLARY
Glucose-Capillary: 167 mg/dL — ABNORMAL HIGH (ref 70–99)
Glucose-Capillary: 120 mg/dL — ABNORMAL HIGH (ref 70–99)

## 2013-04-10 LAB — BASIC METABOLIC PANEL
BUN: 24 mg/dL — ABNORMAL HIGH (ref 6–23)
CO2: 26 mEq/L (ref 19–32)
Chloride: 107 mEq/L (ref 96–112)
Creatinine, Ser: 1.34 mg/dL — ABNORMAL HIGH (ref 0.50–1.10)
Glucose, Bld: 135 mg/dL — ABNORMAL HIGH (ref 70–99)
Potassium: 4.3 mEq/L (ref 3.5–5.1)

## 2013-04-10 LAB — HEPARIN LEVEL (UNFRACTIONATED): Heparin Unfractionated: 0.46 IU/mL (ref 0.30–0.70)

## 2013-04-10 LAB — ABO/RH: ABO/RH(D): B POS

## 2013-04-10 MED ORDER — TEMAZEPAM 15 MG PO CAPS: 15.0000 mg | ORAL_CAPSULE | Freq: Once | ORAL | Status: AC | PRN

## 2013-04-10 MED ORDER — DEXTROSE 5 % IV SOLN
0.5000 ug/min | INTRAVENOUS | Status: DC
  Filled 2013-04-10: qty 4

## 2013-04-10 MED ORDER — MAGNESIUM SULFATE 50 % IJ SOLN
40.0000 meq | INTRAMUSCULAR | Status: DC
  Filled 2013-04-10: qty 10

## 2013-04-10 MED ORDER — HEPARIN SODIUM (PORCINE) 1000 UNIT/ML IJ SOLN
INTRAMUSCULAR | Status: DC
  Filled 2013-04-10: qty 30

## 2013-04-10 MED ORDER — DEXMEDETOMIDINE HCL IN NACL 400 MCG/100ML IV SOLN
0.1000 ug/kg/h | INTRAVENOUS | Status: AC
  Administered 2013-04-11: 0.2 ug/kg/h via INTRAVENOUS
  Filled 2013-04-10: qty 100

## 2013-04-10 MED ORDER — CHLORHEXIDINE GLUCONATE 4 % EX LIQD
60.0000 mL | Freq: Once | CUTANEOUS | Status: AC
  Administered 2013-04-10: 4 via TOPICAL
  Filled 2013-04-10: qty 60

## 2013-04-10 MED ORDER — MIDAZOLAM HCL 2 MG/2ML IJ SOLN: 1.0000 mg | INTRAMUSCULAR | Status: DC | PRN

## 2013-04-10 MED ORDER — NITROGLYCERIN IN D5W 200-5 MCG/ML-% IV SOLN
2.0000 ug/min | INTRAVENOUS | Status: DC
  Filled 2013-04-10: qty 250

## 2013-04-10 MED ORDER — DOPAMINE-DEXTROSE 3.2-5 MG/ML-% IV SOLN
2.0000 ug/kg/min | INTRAVENOUS | Status: DC
  Filled 2013-04-10: qty 250

## 2013-04-10 MED ORDER — PLASMA-LYTE 148 IV SOLN
INTRAVENOUS | Status: DC
  Filled 2013-04-10: qty 2.5

## 2013-04-10 MED ORDER — BISACODYL 5 MG PO TBEC
5.0000 mg | DELAYED_RELEASE_TABLET | Freq: Once | ORAL | Status: DC
  Filled 2013-04-10: qty 1

## 2013-04-10 MED ORDER — DEXTROSE 5 % IV SOLN
1.5000 g | INTRAVENOUS | Status: AC
  Administered 2013-04-11: 1.5 g via INTRAVENOUS
  Filled 2013-04-10: qty 1.5

## 2013-04-10 MED ORDER — SODIUM CHLORIDE 0.9 % IV SOLN
INTRAVENOUS | Status: DC
  Filled 2013-04-10: qty 1

## 2013-04-10 MED ORDER — FENTANYL CITRATE 0.05 MG/ML IJ SOLN: 50.0000 ug | INTRAMUSCULAR | Status: DC | PRN

## 2013-04-10 MED ORDER — POTASSIUM CHLORIDE 2 MEQ/ML IV SOLN
80.0000 meq | INTRAVENOUS | Status: DC
  Filled 2013-04-10: qty 40

## 2013-04-10 MED ORDER — SODIUM CHLORIDE 0.9 % IV SOLN
INTRAVENOUS | Status: DC
  Filled 2013-04-10: qty 40

## 2013-04-10 MED ORDER — LACTATED RINGERS IV SOLN: INTRAVENOUS | Status: DC

## 2013-04-10 MED ORDER — VANCOMYCIN HCL 1000 MG IV SOLR
INTRAVENOUS | Status: DC
  Filled 2013-04-10: qty 1000

## 2013-04-10 MED ORDER — PHENYLEPHRINE HCL 10 MG/ML IJ SOLN
30.0000 ug/min | INTRAVENOUS | Status: DC
  Filled 2013-04-10: qty 2

## 2013-04-10 MED ORDER — CHLORHEXIDINE GLUCONATE 4 % EX LIQD
60.0000 mL | Freq: Once | CUTANEOUS | Status: DC
  Filled 2013-04-10: qty 60

## 2013-04-10 MED ORDER — VANCOMYCIN HCL 10 G IV SOLR
1500.0000 mg | INTRAVENOUS | Status: AC
  Administered 2013-04-11: 1000 mg via INTRAVENOUS
  Filled 2013-04-10: qty 1500

## 2013-04-10 MED ORDER — METOPROLOL TARTRATE 12.5 MG HALF TABLET
12.5000 mg | ORAL_TABLET | Freq: Once | ORAL | Status: AC
  Administered 2013-04-11: 12.5 mg via ORAL
  Filled 2013-04-10 (×2): qty 1

## 2013-04-10 MED ORDER — DEXTROSE 5 % IV SOLN
750.0000 mg | INTRAVENOUS | Status: DC
  Filled 2013-04-10: qty 750

## 2013-04-10 NOTE — Progress Notes (Signed)
TCTS BRIEF PROGRESS NOTE   I have again reviewed the indications, risks and potential benefits of AVR + maze procedure with [name].  The patient was counseled at length regarding surgical alternatives with respect to valve replacement including continued medical therapy versus proceeding with conventional surgical aortic valve replacement using either a mechanical prosthesis or a bioprosthetic tissue valve.  Other alternatives including transcatheter aortic valve replacement were discussed.  Discussion was held comparing the relative risks of mechanical valve replacement with need for lifelong anticoagulation versus use of a bioprosthetic tissue valve and the associated potential for late structural valve deterioration in failure.  This discussion was placed in the context of the patient's particular circumstances, and as a result the patient specifically requests that their valve be replaced using a bioprosthetic tissue valve .  They understand and accept all potential associated risks of surgery including but not limited to risk of death, stroke, myocardial infarction, congestive heart failure, respiratory failure, renal failure, pneumonia, bleeding requiring blood transfusion and or reexploration, arrhythmia, heart block or bradycardia requiring permanent pacemaker, pleural effusions or other delayed complications related to continued congestive heart failure, or other late complications related to valve replacement.  For OR tomorrow  [name] 04/10/2013 1:47 PM

## 2013-04-10 NOTE — Progress Notes (Signed)
ANTICOAGULATION CONSULT NOTE - Follow Up Consult  Pharmacy Consult for Heparin Indication: atrial fibrillation, h/o embolic CVA, awaiting AVR/Maze  Allergies  Allergen Reactions  . Ibuprofen Nausea And Vomiting  . Sulfamethoxazole W-Trimethoprim Swelling  . Sulfonamide Derivatives Swelling  . Tape Rash  . Tramadol Nausea And Vomiting  . Bactrim Swelling  . Red Dye     Patient Measurements: Height: 5\' 2"  (157.5 cm) Weight: 185 lb 11.2 oz (84.233 kg) IBW/kg (Calculated) : 50.1 Heparin Dosing Weight: 70 kg  Vital Signs: Temp: 97.8 F (36.6 C) (09/23 0355) Temp src: Oral (09/23 0355) BP: 151/72 mmHg (09/23 0900) Pulse Rate: 72 (09/23 0900)  Labs:  Recent Labs  04/08/13 0545  04/09/13 0050 04/09/13 1020 04/09/13 1215 04/10/13 0530  HGB 12.3  --   --  13.4  --  11.8*  HCT 38.0  --   --  41.2  --  36.1  PLT 224  --   --  237  --  230  HEPARINUNFRC 0.18*  < > 0.37  --  0.37 0.46  CREATININE 1.06  --   --   --   --  1.34*  < > = values in this interval not displayed.  Estimated Creatinine Clearance: 39.8 ml/min (by C-G formula based on Cr of 1.34).   Medications:  Heparin at 1000 units/hr  Assessment: 67 y/o F s/p cath awaiting AVR/Maze on 9/24 who continues on heparin. HL is 0.46<0.37<0.31 on 1000 units/hr of heparin. Hgb 11.8<13.4, Plts ok. Scr 1.34<1.06 with CrCl ~ 40. No overt bleeding noted.   Goal of Therapy:  Heparin level 0.3-0.7 units/ml Monitor platelets by anticoagulation protocol: Yes   Plan:  -Continue heparin at 1000 units/hr -Daily CBC/HL -Monitor for bleeding -Awaiting AVR/Maze on 9/24  Thank you for allowing me to take part in this patient's care,  Narda Bonds, PharmD Clinical Pharmacist Phone: [phone] Pager: [phone] 04/10/2013 9:44 AM

## 2013-04-10 NOTE — Progress Notes (Signed)
PT Cancellation Note  Patient Details Name: [name] MRN: [id number] DOB: 1945/08/31   Cancelled Treatment:    Reason Eval/Treat Not Completed: Other (comment)Recently ambulated with CR, will hold PT today. Pt for OR procedure tomorrow, please re consult PT when medically appropriate post op.   Duncan Dull 04/10/2013, 4:25 PM

## 2013-04-10 NOTE — Progress Notes (Signed)
   Subjective:  Denies CP or dyspnea   Objective:  Filed Vitals:   04/09/13 1327 04/09/13 1941 04/09/13 2131 04/10/13 0355  BP: 137/72 144/103 161/88 147/70  Pulse: 69 84 74 63  Temp: 98.4 F (36.9 C)  98.3 F (36.8 C) 97.8 F (36.6 C)  TempSrc: Oral  Oral Oral  Resp: 18  18 20   Height:      Weight:    185 lb 11.2 oz (84.233 kg)  SpO2: 96% 99% 98% 96%    Intake/Output from previous day:  Intake/Output Summary (Last 24 hours) at 04/10/13 [id number] Last data filed at 04/09/13 1943  Gross per 24 hour  Intake    600 ml  Output    700 ml  Net   -100 ml    Physical Exam: Physical exam: Well-developed well-nourished in no acute distress.  Skin is warm and dry.  HEENT is normal.  Neck is supple.  Chest is clear to auscultation with normal expansion.  Cardiovascular exam is irregular. 3/6 systolic murmur Abdominal exam nontender or distended. No masses palpated. Extremities show no edema. neuro grossly intact    Lab Results: Basic Metabolic Panel:  Recent Labs  04/08/13 0545 04/10/13 0530  NA 135 143  K 3.8 4.3  CL 101 107  CO2 23 26  GLUCOSE 156* 135*  BUN 18 24*  CREATININE 1.06 1.34*  CALCIUM 9.1 8.3*   CBC:  Recent Labs  04/09/13 1020 04/10/13 0530  WBC 7.2 6.5  HGB 13.4 11.8*  HCT 41.2 36.1  MCV 83.7 82.2  PLT 237 230    Assessment/Plan:  1 severe symptomatic AS- for AVR/CABG/Maze Wed. 2 chronic stage III renal failure-patient is status post renal transplant. Follow renal function following procedure. 3 atrial fibrillation-patient remains in atrial fibrillation. She has had a previous embolic CVA. Continue heparin. Continue beta blocker. Follow heart rate on telemetry. Continue amiodarone. 4 history of coronary artery disease-continue statin. 5 hypertension-continue metoprolol. 6 Abnormal Chest CT -fu chest CT 3 months [name] 04/10/2013, 7:11 AM

## 2013-04-10 NOTE — Progress Notes (Signed)
CARDIAC REHAB PHASE I   PRE:  Rate/Rhythm: 64 Afib  BP:  Supine:   Sitting: 116/76  Standing:    SaO2: 98 RA  MODE:  Ambulation: 550 ft   POST:  Rate/Rhythm: 68  BP:  Supine:   Sitting: 132/90  Standing:    SaO2: 98 RA 1500-1525 Assisted X 1 and used walker to ambulate. Gait steady with walker. VS stable. Pt to side of bed after walk with call light in reach.  Rodney Langton RN 04/10/2013 3:35 PM

## 2013-04-10 NOTE — Progress Notes (Deleted)
Paged MD about possible change in code status after pt's family got MRI results. Passed on to on-coming RN to address with MD. Will continue to monitor the pt. Hoover Brunette, RN

## 2013-04-10 NOTE — Progress Notes (Deleted)
Pt O2 sats currently 83 on RA. Pt placed on 4.5 L via [place] sats 91%, Resp 24. MD made aware. Will continue to monitor the pt. Hoover Brunette

## 2013-04-11 ENCOUNTER — Encounter (HOSPITAL_COMMUNITY): Payer: Self-pay | Admitting: Anesthesiology

## 2013-04-11 ENCOUNTER — Inpatient Hospital Stay (HOSPITAL_COMMUNITY): Payer: Medicare Other

## 2013-04-11 ENCOUNTER — Inpatient Hospital Stay (HOSPITAL_COMMUNITY): Payer: Medicare Other | Admitting: Anesthesiology

## 2013-04-11 ENCOUNTER — Encounter (HOSPITAL_COMMUNITY)
Admission: AD | Disposition: A | Discharge status: Home Health | Payer: Medicare Other | Source: Ambulatory Visit | Attending: Thoracic Surgery (Cardiothoracic Vascular Surgery)

## 2013-04-11 DIAGNOSIS — I359 Nonrheumatic aortic valve disorder, unspecified: Secondary | ICD-10-CM

## 2013-04-11 DIAGNOSIS — S15009A Unspecified injury of unspecified carotid artery, initial encounter: Secondary | ICD-10-CM

## 2013-04-11 HISTORY — PX: INTRAOPERATIVE TRANSESOPHAGEAL ECHOCARDIOGRAM: SHX5062

## 2013-04-11 HISTORY — PX: ARTERY EXPLORATION: SHX5110

## 2013-04-11 LAB — CBC
HCT: 39.6 % (ref 36.0–46.0)
Hemoglobin: 13 g/dL (ref 12.0–15.0)
MCH: 27.4 pg (ref 26.0–34.0)
MCHC: 32.8 g/dL (ref 30.0–36.0)
RBC: 4.75 MIL/uL (ref 3.87–5.11)
WBC: 7.4 10*3/uL (ref 4.0–10.5)

## 2013-04-11 LAB — BASIC METABOLIC PANEL
BUN: 31 mg/dL — ABNORMAL HIGH (ref 6–23)
CO2: 24 mEq/L (ref 19–32)
Calcium: 9.1 mg/dL (ref 8.4–10.5)
Chloride: 101 mEq/L (ref 96–112)
GFR calc Af Amer: 39 mL/min — ABNORMAL LOW (ref >90)
GFR calc non Af Amer: 34 mL/min — ABNORMAL LOW (ref >90)
Glucose, Bld: 151 mg/dL — ABNORMAL HIGH (ref 70–99)
Potassium: 4.8 mEq/L (ref 3.5–5.1)
Sodium: 138 mEq/L (ref 135–145)

## 2013-04-11 LAB — GLUCOSE, CAPILLARY
Glucose-Capillary: 143 mg/dL — ABNORMAL HIGH (ref 70–99)
Glucose-Capillary: 189 mg/dL — ABNORMAL HIGH (ref 70–99)
Glucose-Capillary: 169 mg/dL — ABNORMAL HIGH (ref 70–99)

## 2013-04-11 SURGERY — ECHOCARDIOGRAM, TRANSESOPHAGEAL, INTRAOPERATIVE
Anesthesia: General | Site: Neck | Laterality: Right

## 2013-04-11 MED ORDER — LABETALOL HCL 5 MG/ML IV SOLN: 10.0000 mg | INTRAVENOUS | Status: DC | PRN

## 2013-04-11 MED ORDER — SODIUM CHLORIDE 0.9 % IV SOLN
1250.0000 mg | INTRAVENOUS | Status: AC
  Administered 2013-04-12: 1250 mg via INTRAVENOUS
  Filled 2013-04-11: qty 1250

## 2013-04-11 MED ORDER — LIDOCAINE HCL (CARDIAC) 20 MG/ML IV SOLN
INTRAVENOUS | Status: DC | PRN
  Administered 2013-04-11: 80 mg via INTRAVENOUS
  Administered 2013-04-11: 60 mg via INTRATRACHEAL

## 2013-04-11 MED ORDER — HEPARIN SODIUM (PORCINE) 1000 UNIT/ML IJ SOLN
INTRAMUSCULAR | Status: DC | PRN
  Administered 2013-04-11 (×2): 2000 [IU] via INTRAVENOUS

## 2013-04-11 MED ORDER — LACTATED RINGERS IV SOLN
INTRAVENOUS | Status: DC | PRN
  Administered 2013-04-11 (×2): via INTRAVENOUS

## 2013-04-11 MED ORDER — MAGNESIUM SULFATE 40 MG/ML IJ SOLN: 2.0000 g | Freq: Once | INTRAMUSCULAR | Status: AC | PRN

## 2013-04-11 MED ORDER — VANCOMYCIN HCL 1000 MG IV SOLR
INTRAVENOUS | Status: AC
  Administered 2013-04-12: 07:00:00
  Filled 2013-04-11: qty 1000

## 2013-04-11 MED ORDER — NALOXONE HCL 0.4 MG/ML IJ SOLN
INTRAMUSCULAR | Status: DC | PRN
  Administered 2013-04-11: 80 ug via INTRAVENOUS
  Administered 2013-04-11: 40 ug via INTRAVENOUS

## 2013-04-11 MED ORDER — VECURONIUM BROMIDE 10 MG IV SOLR
INTRAVENOUS | Status: DC | PRN
  Administered 2013-04-11: 2 mg via INTRAVENOUS

## 2013-04-11 MED ORDER — GLYCOPYRROLATE 0.2 MG/ML IJ SOLN
INTRAMUSCULAR | Status: DC | PRN
  Administered 2013-04-11: .8 mg via INTRAVENOUS

## 2013-04-11 MED ORDER — FUROSEMIDE 10 MG/ML IJ SOLN
40.0000 mg | Freq: Once | INTRAMUSCULAR | Status: AC
  Administered 2013-04-11: 40 mg via INTRAVENOUS
  Filled 2013-04-11: qty 4

## 2013-04-11 MED ORDER — LIDOCAINE HCL (PF) 1 % IJ SOLN
INTRAMUSCULAR | Status: AC
  Filled 2013-04-11: qty 30

## 2013-04-11 MED ORDER — MIDAZOLAM HCL 2 MG/2ML IJ SOLN
INTRAMUSCULAR | Status: AC
  Filled 2013-04-11: qty 2

## 2013-04-11 MED ORDER — DEXMEDETOMIDINE HCL IN NACL 400 MCG/100ML IV SOLN
0.1000 ug/kg/h | INTRAVENOUS | Status: AC
  Administered 2013-04-12: .2 ug/kg/h via INTRAVENOUS
  Filled 2013-04-11: qty 100

## 2013-04-11 MED ORDER — DEXTROSE 5 % IV SOLN
750.0000 mg | INTRAVENOUS | Status: DC
  Filled 2013-04-11 (×2): qty 750

## 2013-04-11 MED ORDER — PHENYLEPHRINE HCL 10 MG/ML IJ SOLN
INTRAMUSCULAR | Status: DC | PRN
  Administered 2013-04-11 (×5): 80 ug via INTRAVENOUS

## 2013-04-11 MED ORDER — DOPAMINE-DEXTROSE 3.2-5 MG/ML-% IV SOLN
2.0000 ug/kg/min | INTRAVENOUS | Status: DC
  Filled 2013-04-11: qty 250

## 2013-04-11 MED ORDER — SODIUM CHLORIDE 0.9 % IV SOLN
INTRAVENOUS | Status: AC
  Administered 2013-04-12: 70 mL/h via INTRAVENOUS
  Filled 2013-04-11: qty 40

## 2013-04-11 MED ORDER — SODIUM CHLORIDE 0.9 % IV SOLN
INTRAVENOUS | Status: AC
  Administered 2013-04-12: 2.7 [IU]/h via INTRAVENOUS
  Filled 2013-04-11: qty 1

## 2013-04-11 MED ORDER — FENTANYL CITRATE 0.05 MG/ML IJ SOLN
INTRAMUSCULAR | Status: DC | PRN
  Administered 2013-04-11: 250 ug via INTRAVENOUS
  Administered 2013-04-11: 100 ug via INTRAVENOUS
  Administered 2013-04-11: 250 ug via INTRAVENOUS
  Administered 2013-04-11: 50 ug via INTRAVENOUS
  Administered 2013-04-11 (×2): 100 ug via INTRAVENOUS

## 2013-04-11 MED ORDER — VANCOMYCIN HCL 1000 MG IV SOLR
INTRAVENOUS | Status: DC
  Filled 2013-04-11: qty 1000

## 2013-04-11 MED ORDER — CHLORHEXIDINE GLUCONATE 4 % EX LIQD
60.0000 mL | Freq: Once | CUTANEOUS | Status: AC
  Administered 2013-04-12: 4 via TOPICAL
  Filled 2013-04-11 (×2): qty 60

## 2013-04-11 MED ORDER — METOPROLOL TARTRATE 12.5 MG HALF TABLET
12.5000 mg | ORAL_TABLET | Freq: Once | ORAL | Status: AC
  Administered 2013-04-12: 12.5 mg via ORAL
  Filled 2013-04-11: qty 1

## 2013-04-11 MED ORDER — ACETAMINOPHEN 650 MG RE SUPP: 325.0000 mg | RECTAL | Status: DC | PRN

## 2013-04-11 MED ORDER — NITROGLYCERIN IN D5W 200-5 MCG/ML-% IV SOLN
2.0000 ug/min | INTRAVENOUS | Status: AC
  Administered 2013-04-12: 3 ug/min via INTRAVENOUS
  Filled 2013-04-11: qty 250

## 2013-04-11 MED ORDER — SODIUM CHLORIDE 0.9 % IR SOLN
Status: DC | PRN
  Administered 2013-04-11: 3000 mL

## 2013-04-11 MED ORDER — ONDANSETRON HCL 4 MG/2ML IJ SOLN
INTRAMUSCULAR | Status: DC | PRN
  Administered 2013-04-11 (×2): 4 mg via INTRAVENOUS

## 2013-04-11 MED ORDER — POTASSIUM CHLORIDE 2 MEQ/ML IV SOLN
80.0000 meq | INTRAVENOUS | Status: DC
  Filled 2013-04-11: qty 40

## 2013-04-11 MED ORDER — HYDRALAZINE HCL 20 MG/ML IJ SOLN
10.0000 mg | INTRAMUSCULAR | Status: DC | PRN
  Administered 2013-04-11: 10 mg via INTRAVENOUS

## 2013-04-11 MED ORDER — POTASSIUM CHLORIDE CRYS ER 20 MEQ PO TBCR: 20.0000 meq | EXTENDED_RELEASE_TABLET | Freq: Once | ORAL | Status: DC | PRN

## 2013-04-11 MED ORDER — LACTATED RINGERS IV SOLN
INTRAVENOUS | Status: DC | PRN
  Administered 2013-04-11: 09:00:00 via INTRAVENOUS

## 2013-04-11 MED ORDER — EPINEPHRINE HCL 1 MG/ML IJ SOLN
0.5000 ug/min | INTRAVENOUS | Status: DC
  Filled 2013-04-11: qty 4

## 2013-04-11 MED ORDER — PROTAMINE SULFATE 10 MG/ML IV SOLN
INTRAVENOUS | Status: DC | PRN
  Administered 2013-04-11: 25 mg via INTRAVENOUS

## 2013-04-11 MED ORDER — SODIUM CHLORIDE 0.9 % IV SOLN: 500.0000 mL | Freq: Once | INTRAVENOUS | Status: AC | PRN

## 2013-04-11 MED ORDER — ONDANSETRON HCL 4 MG/2ML IJ SOLN: 4.0000 mg | Freq: Four times a day (QID) | INTRAMUSCULAR | Status: DC | PRN

## 2013-04-11 MED ORDER — DEXTROSE 5 % IV SOLN
1.5000 g | Freq: Two times a day (BID) | INTRAVENOUS | Status: AC
  Administered 2013-04-11: 1.5 g via INTRAVENOUS
  Administered 2013-04-12: 750 g via INTRAVENOUS
  Administered 2013-04-12: 1.5 g via INTRAVENOUS
  Filled 2013-04-11 (×2): qty 1.5

## 2013-04-11 MED ORDER — DEXTROSE 5 % IV SOLN
1.5000 g | INTRAVENOUS | Status: DC
  Filled 2013-04-11: qty 1.5

## 2013-04-11 MED ORDER — 0.9 % SODIUM CHLORIDE (POUR BTL) OPTIME
TOPICAL | Status: DC | PRN
  Administered 2013-04-11: 6000 mL

## 2013-04-11 MED ORDER — NEOSTIGMINE METHYLSULFATE 1 MG/ML IJ SOLN
INTRAMUSCULAR | Status: DC | PRN
  Administered 2013-04-11: 5 mg via INTRAVENOUS

## 2013-04-11 MED ORDER — SODIUM CHLORIDE 0.9 % IR SOLN
Status: DC | PRN
  Administered 2013-04-11: 10:00:00

## 2013-04-11 MED ORDER — CHLORHEXIDINE GLUCONATE 4 % EX LIQD
60.0000 mL | Freq: Once | CUTANEOUS | Status: AC
  Administered 2013-04-11: 4 via TOPICAL
  Filled 2013-04-11: qty 60

## 2013-04-11 MED ORDER — TACROLIMUS 1 MG PO CAPS
5.0000 mg | ORAL_CAPSULE | Freq: Two times a day (BID) | ORAL | Status: DC
  Administered 2013-04-11: 5 mg via ORAL
  Filled 2013-04-11 (×4): qty 5

## 2013-04-11 MED ORDER — SODIUM CHLORIDE 0.9 % IV SOLN
INTRAVENOUS | Status: DC
  Filled 2013-04-11: qty 30

## 2013-04-11 MED ORDER — METOPROLOL TARTRATE 1 MG/ML IV SOLN: 2.0000 mg | INTRAVENOUS | Status: DC | PRN

## 2013-04-11 MED ORDER — PHENYLEPHRINE HCL 10 MG/ML IJ SOLN
10.0000 mg | INTRAVENOUS | Status: DC | PRN
  Administered 2013-04-11: 25 ug/min via INTRAVENOUS

## 2013-04-11 MED ORDER — ACETAMINOPHEN 325 MG PO TABS: 325.0000 mg | ORAL_TABLET | ORAL | Status: DC | PRN

## 2013-04-11 MED ORDER — SODIUM CHLORIDE 0.9 % IJ SOLN
OROMUCOSAL | Status: DC | PRN
  Administered 2013-04-11 (×3): via TOPICAL

## 2013-04-11 MED ORDER — PROPOFOL 10 MG/ML IV BOLUS
INTRAVENOUS | Status: DC | PRN
  Administered 2013-04-11: 90 mg via INTRAVENOUS
  Administered 2013-04-11: 110 mg via INTRAVENOUS

## 2013-04-11 MED ORDER — PANTOPRAZOLE SODIUM 40 MG PO TBEC: 40.0000 mg | DELAYED_RELEASE_TABLET | Freq: Every day | ORAL | Status: DC

## 2013-04-11 MED ORDER — MIDAZOLAM HCL 5 MG/5ML IJ SOLN
INTRAMUSCULAR | Status: DC | PRN
  Administered 2013-04-11: 2 mg via INTRAVENOUS

## 2013-04-11 MED ORDER — MAGNESIUM SULFATE 50 % IJ SOLN
40.0000 meq | INTRAMUSCULAR | Status: DC
  Filled 2013-04-11: qty 10

## 2013-04-11 MED ORDER — ROCURONIUM BROMIDE 100 MG/10ML IV SOLN
INTRAVENOUS | Status: DC | PRN
  Administered 2013-04-11: 50 mg via INTRAVENOUS

## 2013-04-11 MED ORDER — PAPAVERINE HCL 30 MG/ML IJ SOLN
INTRAMUSCULAR | Status: AC
  Administered 2013-04-12: 08:00:00
  Filled 2013-04-11: qty 2.5

## 2013-04-11 MED ORDER — PHENYLEPHRINE HCL 10 MG/ML IJ SOLN
30.0000 ug/min | INTRAVENOUS | Status: AC
  Administered 2013-04-12 (×2): 25 ug/min via INTRAVENOUS
  Filled 2013-04-11: qty 2

## 2013-04-11 SURGICAL SUPPLY — 114 items
ADAPTER CARDIO PERF ANTE/RETRO (ADAPTER) ×10 IMPLANT
ADH SKN CLS APL DERMABOND .7 (GAUZE/BANDAGES/DRESSINGS) ×4
ADPR PRFSN 84XANTGRD RTRGD (ADAPTER) ×8
ATTRACTOMAT 16X20 MAGNETIC DRP (DRAPES) ×10 IMPLANT
BAG DECANTER FOR FLEXI CONT (MISCELLANEOUS) ×5 IMPLANT
BLADE STERNUM SYSTEM 6 (BLADE) ×10 IMPLANT
BLADE SURG 11 STRL SS (BLADE) ×10 IMPLANT
CANISTER SUCTION 2500CC (MISCELLANEOUS) ×10 IMPLANT
CANN PRFSN 3/8X14X24FR PCFC (MISCELLANEOUS)
CANN PRFSN 3/8XCNCT ST RT ANG (MISCELLANEOUS)
CANNULA GUNDRY RCSP 15FR (MISCELLANEOUS) ×10 IMPLANT
CANNULA PRFSN 3/8X14X24FR PCFC (MISCELLANEOUS) IMPLANT
CANNULA PRFSN 3/8XCNCT RT ANG (MISCELLANEOUS) IMPLANT
CANNULA SOFTFLOW AORTIC 7M21FR (CANNULA) ×5 IMPLANT
CANNULA VEN MTL TIP RT (MISCELLANEOUS)
CANNULA VENNOUS METAL TIP 20FR (CANNULA) ×2 IMPLANT
CATH CPB KIT OWEN (MISCELLANEOUS) ×5 IMPLANT
CATH HEART VENT LEFT (CATHETERS) ×4 IMPLANT
CATH SUCT 10FR WHISTLE TIP (CATHETERS) ×2 IMPLANT
CATH THORACIC 28FR RT ANG (CATHETERS) IMPLANT
CATH THORACIC 36FR (CATHETERS) IMPLANT
CATH THORACIC 36FR RT ANG (CATHETERS) ×5 IMPLANT
CLAMP ISOLATOR SYNERGY LG (MISCELLANEOUS) ×5 IMPLANT
CLIP FOGARTY SPRING 6M (CLIP) IMPLANT
CONN 1/2X1/2X1/2  BEN (MISCELLANEOUS) ×2
CONN 1/2X1/2X1/2 BEN (MISCELLANEOUS) ×5 IMPLANT
CONN 3/8X1/2 ST GISH (MISCELLANEOUS) ×10 IMPLANT
COVER SURGICAL LIGHT HANDLE (MISCELLANEOUS) ×10 IMPLANT
CRADLE DONUT ADULT HEAD (MISCELLANEOUS) ×10 IMPLANT
DERMABOND ADVANCED (GAUZE/BANDAGES/DRESSINGS) ×1
DERMABOND ADVANCED .7 DNX12 (GAUZE/BANDAGES/DRESSINGS) ×1 IMPLANT
DRAIN CHANNEL 32F RND 10.7 FF (WOUND CARE) ×10 IMPLANT
DRAPE BILATERAL SPLIT (DRAPES) ×2 IMPLANT
DRAPE CARDIOVASCULAR INCISE (DRAPES)
DRAPE CV SPLIT W-CLR ANES SCRN (DRAPES) IMPLANT
DRAPE INCISE IOBAN 66X45 STRL (DRAPES) ×5 IMPLANT
DRAPE SLUSH/WARMER DISC (DRAPES) ×10 IMPLANT
DRAPE SRG 135X102X78XABS (DRAPES) IMPLANT
DRSG AQUACEL AG ADV 3.5X10 (GAUZE/BANDAGES/DRESSINGS) ×2 IMPLANT
DRSG COVADERM 4X14 (GAUZE/BANDAGES/DRESSINGS) ×10 IMPLANT
ELECT REM PT RETURN 9FT ADLT (ELECTROSURGICAL) ×20
ELECTRODE REM PT RTRN 9FT ADLT (ELECTROSURGICAL) ×16 IMPLANT
GLOVE BIO SURGEON STRL SZ 6 (GLOVE) IMPLANT
GLOVE BIO SURGEON STRL SZ 6.5 (GLOVE) IMPLANT
GLOVE BIO SURGEON STRL SZ7 (GLOVE) IMPLANT
GLOVE BIO SURGEON STRL SZ7.5 (GLOVE) IMPLANT
GLOVE BIO SURGEON STRL SZ8 (GLOVE) ×2 IMPLANT
GLOVE BIOGEL PI IND STRL 6 (GLOVE) ×5 IMPLANT
GLOVE BIOGEL PI IND STRL 6.5 (GLOVE) ×2 IMPLANT
GLOVE BIOGEL PI IND STRL 7.5 (GLOVE) ×2 IMPLANT
GLOVE BIOGEL PI INDICATOR 6 (GLOVE) ×5
GLOVE BIOGEL PI INDICATOR 6.5 (GLOVE) ×2
GLOVE BIOGEL PI INDICATOR 7.5 (GLOVE) ×2
GLOVE ORTHO TXT STRL SZ7.5 (GLOVE) ×15 IMPLANT
GLOVE SURG SS PI 7.5 STRL IVOR (GLOVE) ×4 IMPLANT
GOWN STRL NON-REIN LRG LVL3 (GOWN DISPOSABLE) ×40 IMPLANT
HEMOSTAT POWDER SURGIFOAM 1G (HEMOSTASIS) ×30 IMPLANT
INSERT FOGARTY XLG (MISCELLANEOUS) ×10 IMPLANT
KIT BASIN OR (CUSTOM PROCEDURE TRAY) ×10 IMPLANT
KIT ROOM TURNOVER OR (KITS) ×10 IMPLANT
KIT SUCTION CATH 14FR (SUCTIONS) ×30 IMPLANT
LEAD PACING MYOCARDI (MISCELLANEOUS) ×5 IMPLANT
LOOP VESSEL SUPERMAXI WHITE (MISCELLANEOUS) ×7 IMPLANT
NS IRRIG 1000ML POUR BTL (IV SOLUTION) ×50 IMPLANT
PACK CAROTID (CUSTOM PROCEDURE TRAY) ×2 IMPLANT
PACK OPEN HEART (CUSTOM PROCEDURE TRAY) ×10 IMPLANT
PAD ARMBOARD 7.5X6 YLW CONV (MISCELLANEOUS) ×20 IMPLANT
PROBE CRYO2-ABLATION MALLABLE (MISCELLANEOUS) IMPLANT
SET IRRIG TUBING LAPAROSCOPIC (IRRIGATION / IRRIGATOR) ×10 IMPLANT
SPONGE GAUZE 4X4 12PLY (GAUZE/BANDAGES/DRESSINGS) ×14 IMPLANT
SUCKER INTRACARDIAC WEIGHTED (SUCKER) ×5 IMPLANT
SUT BONE WAX W31G (SUTURE) ×10 IMPLANT
SUT ETHIBON 2 0 V 52N 30 (SUTURE) ×10 IMPLANT
SUT ETHIBON EXCEL 2-0 V-5 (SUTURE) IMPLANT
SUT ETHIBOND 2 0 SH (SUTURE) ×10
SUT ETHIBOND 2 0 SH 36X2 (SUTURE) ×8 IMPLANT
SUT ETHIBOND 2 0 V4 (SUTURE) IMPLANT
SUT ETHIBOND 2 0V4 GREEN (SUTURE) IMPLANT
SUT ETHIBOND 4 0 RB 1 (SUTURE) IMPLANT
SUT ETHIBOND V-5 VALVE (SUTURE) IMPLANT
SUT ETHIBOND X763 2 0 SH 1 (SUTURE) ×25 IMPLANT
SUT MNCRL AB 3-0 PS2 18 (SUTURE) ×24 IMPLANT
SUT PDS AB 1 CTX 36 (SUTURE) ×20 IMPLANT
SUT PROLENE 3 0 SH 1 (SUTURE) ×5 IMPLANT
SUT PROLENE 3 0 SH DA (SUTURE) ×2 IMPLANT
SUT PROLENE 4 0 RB 1 (SUTURE) ×30
SUT PROLENE 4 0 SH DA (SUTURE) ×15 IMPLANT
SUT PROLENE 4-0 RB1 .5 CRCL 36 (SUTURE) ×18 IMPLANT
SUT PROLENE 5 0 C 1 36 (SUTURE) ×10 IMPLANT
SUT PROLENE 6 0 BV (SUTURE) ×4 IMPLANT
SUT PROLENE 6 0 C 1 30 (SUTURE) IMPLANT
SUT SILK  1 MH (SUTURE) ×2
SUT SILK 1 MH (SUTURE) ×8 IMPLANT
SUT SILK 2 0 SH CR/8 (SUTURE) IMPLANT
SUT SILK 3 0 SH CR/8 (SUTURE) IMPLANT
SUT STEEL 6MS V (SUTURE) IMPLANT
SUT STEEL STERNAL CCS#1 18IN (SUTURE) IMPLANT
SUT STEEL SZ 6 DBL 3X14 BALL (SUTURE) IMPLANT
SUT VIC AB 2-0 CTX 27 (SUTURE) IMPLANT
SUT VIC AB 2-0 CTX 36 (SUTURE) ×2 IMPLANT
SUT VIC AB 3-0 SH 27 (SUTURE) ×10
SUT VIC AB 3-0 SH 27X BRD (SUTURE) ×2 IMPLANT
SUT VIC AB 4-0 PS2 27 (SUTURE) ×2 IMPLANT
SUTURE E-PAK OPEN HEART (SUTURE) ×5 IMPLANT
SYS ATRICLIP LAA EXCLUSION 45 (CLIP) IMPLANT
SYSTEM SAHARA CHEST DRAIN ATS (WOUND CARE) ×10 IMPLANT
TAPE CLOTH SURG 4X10 WHT LF (GAUZE/BANDAGES/DRESSINGS) ×2 IMPLANT
TOWEL OR 17X24 6PK STRL BLUE (TOWEL DISPOSABLE) ×20 IMPLANT
TOWEL OR 17X26 10 PK STRL BLUE (TOWEL DISPOSABLE) ×20 IMPLANT
TRAY FOLEY IC TEMP SENS 14FR (CATHETERS) ×10 IMPLANT
TUBE SUCT INTRACARD DLP 20F (MISCELLANEOUS) ×5 IMPLANT
UNDERPAD 30X30 INCONTINENT (UNDERPADS AND DIAPERS) ×10 IMPLANT
VENT LEFT HEART 12002 (CATHETERS) ×5
WATER STERILE IRR 1000ML POUR (IV SOLUTION) ×20 IMPLANT

## 2013-04-11 NOTE — Preoperative (Signed)
Beta Blockers   Reason not to administer Beta Blockers:Not Applicable 

## 2013-04-11 NOTE — Consult Note (Signed)
V[facility] and [facility]in Specialist of [facility]      Consult Note  Patient name: [name] MRN: [id number] DOB: February 20, 1946 Sex: female  Consulting Physician:  Dr. Roxy Manns  Reason for Consult: No chief complaint on file.   HISTORY OF PRESENT ILLNESS: This is a 67 year old female who was scheduled for AVR plus Maze procedure today. During placement of central monitoring, a sheath was placed into the carotid artery. I have been asked to assist with removal.  Past Medical History  Diagnosis Date  . Coronary artery disease 05/2002    a. Ant MI 2003 s/p PTCA/stent to RCA.   Marland Kitchen Hypertension   . Hyperlipidemia   . CHF (congestive heart failure)   . Pericardial effusion     a. Small by echo 11/2011.  Marland Kitchen GERD (gastroesophageal reflux disease)   . 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 Unspecified hearing loss   . Anemia, iron deficiency     of chronic disease  . Helicobacter pylori (H. pylori) infection     hx of  . Esophagitis, reflux   . Gout   . Cholelithiasis   . Hx of colonic polyps   . Diverticulosis of colon   . 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  Marland Kitchen Chronic cough onset 03/2010    Dr Melvyn Novas  . Carotid artery disease     a. Carotid Dopplers performed in August 2013 showed 40-59% left stenosis and 0-39% right; f/u recommended in 2 years.   . Aortic stenosis     a. Severe AS by echo 11/2012.  . Asystole     a. During ENT surgery 2005: developed marked asystole requiring CPR, felt due to vagal reaction (cath nonobst dz).  . Myocardial infarction 2003  . Cerebrovascular accident 2009    a. LMCA infarct felt embolic [id number], maintained on chronic coumadin.; denies residual on 04/05/2013  . Sleep apnea     Pt says testing was positive, intolerant of CPAP.  Marland Kitchen Type II diabetes mellitus   . ESRD (end stage renal  disease)     a. Mass on L kidney per pt s/p nephrectomy - pt states not cancer - [facility] notes indicate ESRD due to HTN/DM - was previously on HD. b. Kidney transplant 02/2011.  . S/P kidney transplant 03/16/2011  . Chronic Persistent Atrial Fibrillation 12/31/2008    Qualifier: Diagnosis of  By: [name]    . Nodule of right lung 04/07/2013    Ground glass opacity right lung    Past Surgical History  Procedure Laterality Date  . Cholecystectomy  2009  . Arteriovenous graft placement Left   . Av fistula placement Right   . Nasal reconstruction with septal repair      "took it out" (04/05/2013)  . Tubal ligation    . Nephrectomy Left 2010    no CA on bx  . Kidney transplant  03/16/11  . Cardioversion  05/29/2012    Procedure: CARDIOVERSION;  Surgeon: Lelon Perla, MD;  Location: [facility] ENDOSCOPY;  Service: Cardiovascular;  Laterality: N/A;  . Coronary angioplasty with stent placement Right     coronary artery  . Tonsillectomy    . Total abdominal hysterectomy    . Arteriovenous graft placement Left     "I've had 2 on my left; had one removed" (04/05/2013)   . Av fistula repair  Right     "took it out" ((/18/2014)  . Insertion of dialysis catheter Bilateral     "over the years; took them both out" (04/05/2013)    History   Social History  . Marital Status: Married    Spouse Name: N/A    Number of Children: N/A  . Years of Education: N/A   Occupational History  . retired    Social History Main Topics  . Smoking status: Former Smoker -- 1.00 packs/day for 30 years    Types: Cigarettes    Quit date: 07/19/2001  . Smokeless tobacco: Never Used  . Alcohol Use: No  . Drug Use: No  . Sexual Activity: Not Currently   Other Topics Concern  . Not on file   Social History Narrative   Patient signed a Designated Party Release to allow her spouse [name] and family and five children to have access to her medical records/information.      Family History  Problem Relation Age  of Onset  . Stroke Father   . Diabetes Other   . Hypertension Mother     Allergies as of 03/26/2013 - Review Complete 02/12/2013  Allergen Reaction Noted  . Ibuprofen Nausea And Vomiting 05/22/2012  . Sulfamethoxazole w-trimethoprim Swelling   . Sulfonamide derivatives Swelling   . Tape Rash 01/26/2012  . Tramadol Nausea And Vomiting 05/22/2012  . Bactrim Swelling 10/15/2010    No current facility-administered medications on file prior to encounter.   Current Outpatient Prescriptions on File Prior to Encounter  Medication Sig Dispense Refill  . cinacalcet (SENSIPAR) 60 MG tablet Take 60 mg by mouth every other day.      . dapsone 25 MG tablet Take 25 mg by mouth daily.       . diphenhydrAMINE (BENADRYL) 50 MG capsule Take 1 capsule (50 mg total) by mouth every 6 (six) hours as needed for itching.  90 capsule  3  . Flurandrenolide 4 MCG/SQCM TAPE Apply topically daily.      . furosemide (LASIX) 20 MG tablet Take 1 tablet by mouth daily.      . mycophenolate (MYFORTIC) 180 MG EC tablet Take 360 mg by mouth 2 (two) times daily.       . pravastatin (PRAVACHOL) 40 MG tablet Take 40 mg by mouth daily. Take one tablet Mon, Wed, and Friday      . tacrolimus (PROGRAF) 1 MG capsule Take 5 mg by mouth 2 (two) times daily. Take 5 capsules in the am, and 5 capsules in the pm.      . triamcinolone cream (KENALOG) 0.1 % Apply 1 application topically 2 (two) times daily.          REVIEW OF SYSTEMS: please see admission history and physical  PHYSICAL EXAMINATION: General: The patient appears their stated age.  Vital signs are BP 155/62  Pulse 68  Temp(Src) 97.4 F (36.3 C) (Oral)  Resp 18  Ht 5\' 2"  (1.575 m)  Wt 185 lb 11.2 oz (84.233 kg)  BMI 33.96 kg/m2  SpO2 95% Pulmonary: Respirations are non-labored HEENT: Right neck catheter present with arterial tracings  Musculoskeletal: There are no major deformities.   Neurologic: No focal weakness or paresthesias are detected, Skin: There  are no ulcer or rashes noted. Psychiatric: The patient has normal affect.   Assessment:  Central venous catheter within the right carotid artery Plan:  After examining the patient appears as if the catheter is in the carotid artery within the neck. I do not  think additional imaging is required to identify the access location. I have discussed with the patient proceeding with removal of the operating room. If this goes without incident, I feel that she would be safe to proceed with her previously scheduled procedure. The patient is in agreement with this plan.     Eldridge Abrahams, M.D. [facility] [place] Office: [phone] Pager:  [phone]

## 2013-04-11 NOTE — Anesthesia Procedure Notes (Signed)
Procedure Name: Intubation Date/Time: 04/11/2013 9:15 AM Performed by: [place] Pink Pre-anesthesia Checklist: Patient identified, Emergency Drugs available, Suction available, Patient being monitored and Timeout performed Patient Re-evaluated:Patient Re-evaluated prior to inductionOxygen Delivery Method: Circle system utilized Preoxygenation: Pre-oxygenation with 100% oxygen Intubation Type: IV induction Ventilation: Mask ventilation without difficulty and Oral airway inserted - appropriate to patient size Laryngoscope Size: Mac and 3 Grade View: Grade I Tube type: Oral Tube size: 8.0 mm Number of attempts: 1 Airway Equipment and Method: Stylet Placement Confirmation: ETT inserted through vocal cords under direct vision,  positive ETCO2 and breath sounds checked- equal and bilateral Secured at: 22 cm Tube secured with: Tape Dental Injury: Teeth and Oropharynx as per pre-operative assessment     Procedure Name: Intubation Date/Time: 04/11/2013 11:30 AM Performed by: Maryland Pink Pre-anesthesia Checklist: Patient identified, Emergency Drugs available, Suction available, Patient being monitored and Timeout performed Patient Re-evaluated:Patient Re-evaluated prior to inductionOxygen Delivery Method: Circle system utilized Preoxygenation: Pre-oxygenation with 100% oxygen Intubation Type: IV induction Ventilation: Mask ventilation without difficulty and Oral airway inserted - appropriate to patient size Laryngoscope Size: Mac and 3 Grade View: Grade I Tube type: Oral Tube size: 7.0 mm Number of attempts: 1 Airway Equipment and Method: LTA kit utilized Placement Confirmation: ETT inserted through vocal cords under direct vision,  positive ETCO2 and breath sounds checked- equal and bilateral Secured at: 21 cm Tube secured with: Tape Dental Injury: Teeth and Oropharynx as per pre-operative assessment

## 2013-04-11 NOTE — Progress Notes (Signed)
TCTS BRIEF SICU PROGRESS NOTE  Day of Surgery  S/P Procedure(s) (LRB): INTRAOPERATIVE TRANSESOPHAGEAL ECHOCARDIOGRAM (N/A) FLUROSCOPY GUIDED CENTRAL VENOUS ACCESS DEVICE PLACEMENT  (Left) ARTERY EXPLORATION (Right)   [name] is awake, alert and neurologically intact.  Her only complaint is soreness in her throat. Afib w/ stable HR, BP stable O2 sats 100% UOP adequate CXR w/ some mild CHF  Plan: Will plan to proceed with AVR + maze tomorrow.  Lasix x1 tonight.    Selwyn Reason H 04/11/2013 5:21 PM

## 2013-04-11 NOTE — Op Note (Signed)
[facility]  Patient name: [name] MRN: [id number] DOB: 12-13-1945 Sex: female  04/05/2013 - 04/11/2013 Pre-operative Diagnosis: Catheter was then right carotid artery Post-operative diagnosis:  Same Surgeon:  Eldridge Abrahams Assistants:  Dr. Roxy Manns Procedure:   #1: Exploration of right carotid artery.   #2: Removal of catheter within the right carotid and primary repair of right carotid Anesthesia:  Gen. Blood Loss:  See anesthesia record Specimens:  None  Findings:  The catheter entry point was just distal to the innominate artery bifurcation. This was repaired with pledgeted 5-0 Prolene suture  Indications:  The patient was scheduled for heart surgery earlier today. During line placement, a large catheter was placed into the arterial system in the right neck. This was confirmed by arterial tracings.  Procedure:  The patient was identified in the holding area and taken to [facility] [address]  The patient was then placed supine on the table. general anesthesia was administered.  The patient was prepped and draped in the usual sterile fashion.  A time out was called and antibiotics were administered.  An oblique incision was made beginning at the catheter skin entry site extending obliquely towards the sternal notch. Cautery was used to divide the subcutaneous tissue and platysma muscle. I continued to trace the catheter down. The internal jugular vein was identified in the laterally. The carotid artery was very tortuous in this area and it looped back on itself. I continued to dissect more proximally until I saw the entry point of the catheter into the artery. I initially thought this was at the level of the innominate artery. This was confirmed when I placed a Cooley J. clamp across the entry site, removed the catheter and still had significant bleeding from this area. In order to control the bleeding I initially placed a horizontal mattress 5-0 Prolene suture.  This provided relatively good hemostasis. A pledgeted 5-0 Prolene suture was then placed which provided excellent hemostasis. Doppler was used to confirm that there did waveforms proximal and distal to the closure site. The patient was given heparin prior to the procedure start time and an additional 2000 units at the time of skin incision. Therefore, I gave 25 mg of protamine. The wound was then year gave it and washed out. Once I was satisfied with hemostasis, the muscle was closed with 3-0 Vicryl , the subcutaneous tissues) Vicryl, and the skin was closed with 4-0 Vicryl. Dermabond was applied   Disposition:  To PACU in stable condition.   Theotis Burrow, M.D. [facility] [place] Office: [phone] Pager:  [phone]

## 2013-04-11 NOTE — Op Note (Signed)
CARDIOTHORACIC SURGERY OPERATIVE NOTE  Date of Procedure:  04/11/2013  Preoperative Diagnosis:   Severe Aortic Stenosis  Recurrent Persistent Atrial Fibrillation  Inadvertent Placement of Introducing Sheath into Right Carotid Artery  Postoperative Diagnosis: Same  Procedure:     Placement of Left Subclavian Introducing Sheath Using Fluoroscopic Guidance  Placement of Gordy Councilman Pulmonary Artery Catheter Using Fluoroscopic Guidance  Surgeon:   Valentina Gu. Roxy Manns, MD  Anesthesia:   Warrick Parisian, MD    BRIEF CLINICAL HISTORY  Patient is a 67 year old African American female from [place] with history of aortic stenosis, coronary artery disease, previous stroke, chronic persistent atrial fibrillation on Coumadin, type 2 diabetes mellitus, chronic kidney disease status post cadaveric renal transplantation in 2012, and pericardial effusion who has been followed for several years by Dr. [name] for her underlying cardiac disease including aortic stenosis, coronary artery disease, and atrial fibrillation. She suffered an acute inferior wall myocardial infarction in 2003 for which she was treated with PCI and stenting of the right coronary artery. The patient's most recent echocardiogram was performed in may of 2014. At that time she was noted to have significant progression of the severity of aortic stenosis with peak velocity across the valve measured consistently greater than 4 m/s corresponding to a estimated mean transvalvular gradient of 40 mm mercury. Left ventricular systolic function remained preserved with ejection fraction estimated 55-60%. She was subsequently seen in follow up by Dr. [name] on 02/12/2013 at which time discussions were entertained regarding the possibility of elective surgical intervention. The patient was admitted for elective left and right heart catheterization and surgical consultation has been requested.  The patient has been seen in consultation and  counseled at length regarding the indications, risks and potential benefits of aortic valve replacement and maze procedure.  All questions have been answered, and the patient provides full informed consent for the operation as planned.  On the morning of surgery attempts at placement of the introducing sheath into the right internal jugular vein by Dr. Tamala Julian in preop holding where complicated by inadvertent placement of the large bore sheath into the right carotid artery. Consultation was immediately obtained from Dr. Trula Slade with vascular surgery and plans were made to bring the patient directly to the operating room for removal of the introducing sheath and repair of the carotid artery. The patient was administered low dose intravenous heparin preoperatively to prevent the possibility of clot formation within the carotid artery.     DETAILS OF THE OPERATIVE PROCEDURE  Patient was brought to the operating room on the above-mentioned date and placed in the supine position on the operating table. A right brachial arterial line is placed by Dr. Tamala Julian. General endotracheal anesthesia is induced uneventfully. The patient's anterior chest and neck prepared and draped in a sterile manner. The left subclavian vein is cannulated using the Seldinger technique and a guidewire advanced into the right atrium using fluoroscopic guidance. The subclavian vein is dilated and a large bore introducer and sheath is passed over the guidewire into the subclavian vein. A Swan-Ganz pulmonary artery catheter is subsequently passed through the introducing sheath and floated through the right atrium and right ventricle into the pulmonary artery using fluoroscopic guidance while continuously monitoring the pressure wave form from the distal pulmonary artery catheter report. The introducing sheath was secured to the skin.  The patient subsequently underwent removal of the large bore sheath from the right carotid artery with repair of  the carotid artery by Dr. Trula Slade. Details of that  procedure to be documented separately by Dr. Trula Slade. Because of the complexity of the repair, plans to proceed with aortic valve replacement and Maze procedure were temporarily postponed to facilitate careful assessment of the patient's neurologic status following repair of the carotid artery.    Valentina Gu. Roxy Manns, MD 04/11/2013 11:09 AM

## 2013-04-11 NOTE — Anesthesia Postprocedure Evaluation (Signed)
  Anesthesia Post-op Note  Patient: [name]  Procedure(s) Performed: Procedure(s) with comments: INTRAOPERATIVE TRANSESOPHAGEAL ECHOCARDIOGRAM (N/A) FLUROSCOPY GUIDED CENTRAL VENOUS ACCESS DEVICE PLACEMENT  (Left) - Left Subclavian central line placement with swannganz pulmonary artery placemnt with fluroscopy ARTERY EXPLORATION (Right) - Right carotid artery exploration  Patient Location: PACU  Anesthesia Type:General  Level of Consciousness: awake, oriented, sedated and patient cooperative  Airway and Oxygen Therapy: Patient Spontanous Breathing  Post-op Pain: mild  Post-op Assessment: Post-op Vital signs reviewed, Patient's Cardiovascular Status Stable, Respiratory Function Stable, Patent Airway, No signs of Nausea or vomiting and Pain level controlled  Post-op Vital Signs: stable  Complications: cardiovascular complications

## 2013-04-11 NOTE — Progress Notes (Signed)
Pharmacy notified X2 for 50mg  lopressor, medication has still not arrived. Will give medication when it arrives on unit.

## 2013-04-11 NOTE — Transfer of Care (Signed)
Immediate Anesthesia Transfer of Care Note  Patient: [name]  Procedure(s) Performed: Procedure(s) with comments: INTRAOPERATIVE TRANSESOPHAGEAL ECHOCARDIOGRAM (N/A) FLUROSCOPY GUIDED CENTRAL VENOUS ACCESS DEVICE PLACEMENT  (Left) - Left Subclavian central line placement with swannganz pulmonary artery placemnt with fluroscopy ARTERY EXPLORATION (Right) - Right carotid artery exploration  Patient Location: PACU  Anesthesia Type:General  Level of Consciousness: sedated, responds to stimulation and Patient remains intubated per anesthesia plan  Airway & Oxygen Therapy: Patient Spontanous Breathing, Patient remains intubated per anesthesia plan and Patient placed on Ventilator (see vital sign flow sheet for setting)  Post-op Assessment: Report given to PACU RN, Post -op Vital signs reviewed and stable, Patient moving all extremities X 4 and Patient able to stick tongue midline  Post vital signs: Reviewed and stable  Complications: Patient re-intubated and Right IJ CVL placement- see note

## 2013-04-11 NOTE — Progress Notes (Signed)
TCTS BRIEF PROGRESS NOTE   Patient had a large bore sheath inadvertently placed into the right carotid artery in pre-op holding.  Patient is awake and alert, without any obvious neurologic deficit.  Discussed options with Dr Trula Slade from Vascular Surgery, the patient, and her husband.  We plan to proceed directly to OR where Dr Trula Slade will remove the sheath and repair the carotid artery.  Depending upon findings related to the degree of injury to the carotid artery we will decide as to whether or not to proceed with AVR + maze procedure today.  All questions answered.  [name],[name] 04/11/2013 9:03 AM

## 2013-04-11 NOTE — Anesthesia Preprocedure Evaluation (Signed)
Anesthesia Evaluation  Patient identified by MRN, date of birth, ID band Patient awake    Reviewed: Allergy & Precautions, H&P , NPO status , Patient's Chart, lab work & pertinent test results  Airway Mallampati: I TM Distance: >3 FB Neck ROM: full    Dental   Pulmonary sleep apnea ,          Cardiovascular hypertension, + CAD, + Past MI, + Peripheral Vascular Disease and +CHF + dysrhythmias Atrial Fibrillation Rhythm:irregular Rate:Abnormal     Neuro/Psych PSYCHIATRIC DISORDERS CVA    GI/Hepatic GERD-  ,  Endo/Other  diabetes, Type 2, Insulin Dependent and Oral Hypoglycemic Agents  Renal/GU Renal InsufficiencyRenal disease     Musculoskeletal   Abdominal   Peds  Hematology   Anesthesia Other Findings   Reproductive/Obstetrics                           Anesthesia Physical Anesthesia Plan  ASA: IV  Anesthesia Plan: General   Post-op Pain Management:    Induction: Intravenous  Airway Management Planned: Oral ETT  Additional Equipment: Arterial line, CVP, PA Cath and TEE  Intra-op Plan:   Post-operative Plan: Post-operative intubation/ventilation  Informed Consent: I have reviewed the patients History and Physical, chart, labs and discussed the procedure including the risks, benefits and alternatives for the proposed anesthesia with the patient or authorized representative who has indicated his/her understanding and acceptance.     Plan Discussed with: CRNA, Anesthesiologist and Surgeon  Anesthesia Plan Comments:         Anesthesia Quick Evaluation

## 2013-04-12 ENCOUNTER — Encounter (HOSPITAL_COMMUNITY): Payer: Self-pay | Admitting: Anesthesiology

## 2013-04-12 ENCOUNTER — Encounter (HOSPITAL_COMMUNITY)
Admission: AD | Disposition: A | Discharge status: Home Health | Payer: Medicare Other | Source: Ambulatory Visit | Attending: Thoracic Surgery (Cardiothoracic Vascular Surgery)

## 2013-04-12 ENCOUNTER — Inpatient Hospital Stay (HOSPITAL_COMMUNITY): Payer: Medicare Other

## 2013-04-12 ENCOUNTER — Inpatient Hospital Stay (HOSPITAL_COMMUNITY): Payer: Medicare Other | Admitting: Anesthesiology

## 2013-04-12 DIAGNOSIS — I359 Nonrheumatic aortic valve disorder, unspecified: Secondary | ICD-10-CM

## 2013-04-12 DIAGNOSIS — Z953 Presence of xenogenic heart valve: Secondary | ICD-10-CM

## 2013-04-12 DIAGNOSIS — I4891 Unspecified atrial fibrillation: Secondary | ICD-10-CM

## 2013-04-12 DIAGNOSIS — Z8679 Personal history of other diseases of the circulatory system: Secondary | ICD-10-CM

## 2013-04-12 HISTORY — PX: MAZE: SHX5063

## 2013-04-12 HISTORY — PX: INTRAOPERATIVE TRANSESOPHAGEAL ECHOCARDIOGRAM: SHX5062

## 2013-04-12 HISTORY — DX: Personal history of other diseases of the circulatory system: Z86.79

## 2013-04-12 HISTORY — PX: AORTIC VALVE REPLACEMENT: SHX41

## 2013-04-12 HISTORY — DX: Presence of xenogenic heart valve: Z95.3

## 2013-04-12 LAB — POCT I-STAT 4, (NA,K, GLUC, HGB,HCT)
Glucose, Bld: 157 mg/dL — ABNORMAL HIGH (ref 70–99)
Glucose, Bld: 153 mg/dL — ABNORMAL HIGH (ref 70–99)
Glucose, Bld: 134 mg/dL — ABNORMAL HIGH (ref 70–99)
Glucose, Bld: 162 mg/dL — ABNORMAL HIGH (ref 70–99)
HCT: 31 % — ABNORMAL LOW (ref 36.0–46.0)
HCT: 23 % — ABNORMAL LOW (ref 36.0–46.0)
Hemoglobin: 11.9 g/dL — ABNORMAL LOW (ref 12.0–15.0)
Hemoglobin: 11.2 g/dL — ABNORMAL LOW (ref 12.0–15.0)
Hemoglobin: 10.5 g/dL — ABNORMAL LOW (ref 12.0–15.0)
Hemoglobin: 12.9 g/dL (ref 12.0–15.0)
Hemoglobin: 7.8 g/dL — ABNORMAL LOW (ref 12.0–15.0)
Hemoglobin: 8.2 g/dL — ABNORMAL LOW (ref 12.0–15.0)
Hemoglobin: 8.2 g/dL — ABNORMAL LOW (ref 12.0–15.0)
Potassium: 4.2 mEq/L (ref 3.5–5.1)
Potassium: 3.7 mEq/L (ref 3.5–5.1)
Potassium: 4 mEq/L (ref 3.5–5.1)
Potassium: 5.7 mEq/L — ABNORMAL HIGH (ref 3.5–5.1)
Potassium: 4.9 mEq/L (ref 3.5–5.1)
Sodium: 138 mEq/L (ref 135–145)
Sodium: 141 mEq/L (ref 135–145)
Sodium: 138 mEq/L (ref 135–145)
Sodium: 140 mEq/L (ref 135–145)

## 2013-04-12 LAB — POCT I-STAT 3, ART BLOOD GAS (G3+)
Acid-Base Excess: 3 mmol/L — ABNORMAL HIGH (ref 0.0–2.0)
Acid-base deficit: 2 mmol/L (ref 0.0–2.0)
Bicarbonate: 22.2 mEq/L (ref 20.0–24.0)
O2 Saturation: 100 %
pCO2 arterial: 33.4 mmHg — ABNORMAL LOW (ref 35.0–45.0)
pH, Arterial: 7.503 — ABNORMAL HIGH (ref 7.350–7.450)
pO2, Arterial: 376 mmHg — ABNORMAL HIGH (ref 80.0–100.0)
pO2, Arterial: 256 mmHg — ABNORMAL HIGH (ref 80.0–100.0)

## 2013-04-12 LAB — PROTIME-INR
INR: 1.7 — ABNORMAL HIGH (ref 0.00–1.49)
Prothrombin Time: 19.5 seconds — ABNORMAL HIGH (ref 11.6–15.2)

## 2013-04-12 LAB — APTT: aPTT: 39 seconds — ABNORMAL HIGH (ref 24–37)

## 2013-04-12 LAB — BASIC METABOLIC PANEL
CO2: 26 mEq/L (ref 19–32)
Calcium: 8.7 mg/dL (ref 8.4–10.5)
Creatinine, Ser: 1.26 mg/dL — ABNORMAL HIGH (ref 0.50–1.10)
GFR calc Af Amer: 49 mL/min — ABNORMAL LOW (ref >90)
Glucose, Bld: 140 mg/dL — ABNORMAL HIGH (ref 70–99)
Potassium: 4.4 mEq/L (ref 3.5–5.1)
Sodium: 142 mEq/L (ref 135–145)

## 2013-04-12 LAB — PLATELET COUNT: Platelets: 134 10*3/uL — ABNORMAL LOW (ref 150–400)

## 2013-04-12 LAB — CBC
HCT: 33.3 % — ABNORMAL LOW (ref 36.0–46.0)
HCT: 24.8 % — ABNORMAL LOW (ref 36.0–46.0)
HCT: 26.1 % — ABNORMAL LOW (ref 36.0–46.0)
Hemoglobin: 8.2 g/dL — ABNORMAL LOW (ref 12.0–15.0)
MCH: 27 pg (ref 26.0–34.0)
MCH: 27.2 pg (ref 26.0–34.0)
MCH: 27.6 pg (ref 26.0–34.0)
MCHC: 33.1 g/dL (ref 30.0–36.0)
MCHC: 33.7 g/dL (ref 30.0–36.0)
MCV: 82.6 fL (ref 78.0–100.0)
MCV: 81.8 fL (ref 78.0–100.0)
Platelets: 208 10*3/uL (ref 150–400)
RBC: 4.03 MIL/uL (ref 3.87–5.11)
RBC: 3.01 MIL/uL — ABNORMAL LOW (ref 3.87–5.11)
RDW: 15.6 % — ABNORMAL HIGH (ref 11.5–15.5)
RDW: 15.1 % (ref 11.5–15.5)
WBC: 11.7 10*3/uL — ABNORMAL HIGH (ref 4.0–10.5)
WBC: 14.7 10*3/uL — ABNORMAL HIGH (ref 4.0–10.5)
WBC: 11.2 10*3/uL — ABNORMAL HIGH (ref 4.0–10.5)

## 2013-04-12 LAB — CREATININE, SERUM
Creatinine, Ser: 1.13 mg/dL — ABNORMAL HIGH (ref 0.50–1.10)
GFR calc non Af Amer: 48 mL/min — ABNORMAL LOW (ref >90)

## 2013-04-12 LAB — POCT I-STAT, CHEM 8
BUN: 19 mg/dL (ref 6–23)
Creatinine, Ser: 1.3 mg/dL — ABNORMAL HIGH (ref 0.50–1.10)
Glucose, Bld: 99 mg/dL (ref 70–99)
Potassium: 4.2 mEq/L (ref 3.5–5.1)
Sodium: 139 mEq/L (ref 135–145)

## 2013-04-12 SURGERY — REPLACEMENT, AORTIC VALVE, OPEN
Anesthesia: General | Site: Chest | Wound class: Clean

## 2013-04-12 MED ORDER — DOCUSATE SODIUM 100 MG PO CAPS
200.0000 mg | ORAL_CAPSULE | Freq: Every day | ORAL | Status: DC
  Administered 2013-04-13 – 2013-04-17 (×3): 200 mg via ORAL
  Filled 2013-04-12 (×8): qty 2

## 2013-04-12 MED ORDER — SODIUM CHLORIDE 0.9 % IJ SOLN
3.0000 mL | INTRAMUSCULAR | Status: DC | PRN
  Administered 2013-04-15: 3 mL via INTRAVENOUS

## 2013-04-12 MED ORDER — VECURONIUM BROMIDE 10 MG IV SOLR
INTRAVENOUS | Status: DC | PRN
  Administered 2013-04-12 (×2): 5 mg via INTRAVENOUS

## 2013-04-12 MED ORDER — LACTATED RINGERS IV SOLN
INTRAVENOUS | Status: DC | PRN
  Administered 2013-04-12: 07:00:00 via INTRAVENOUS

## 2013-04-12 MED ORDER — INSULIN REGULAR BOLUS VIA INFUSION
0.0000 [IU] | Freq: Three times a day (TID) | INTRAVENOUS | Status: DC
  Filled 2013-04-12: qty 10

## 2013-04-12 MED ORDER — SODIUM CHLORIDE 0.9 % IJ SOLN
3.0000 mL | Freq: Two times a day (BID) | INTRAMUSCULAR | Status: DC
  Administered 2013-04-13 – 2013-04-17 (×7): 3 mL via INTRAVENOUS
  Administered 2013-04-17: 10 mL via INTRAVENOUS
  Administered 2013-04-19: 23:00:00 via INTRAVENOUS

## 2013-04-12 MED ORDER — PROPOFOL 10 MG/ML IV BOLUS
INTRAVENOUS | Status: DC | PRN
  Administered 2013-04-12: 50 mg via INTRAVENOUS
  Administered 2013-04-12: 75 mg via INTRAVENOUS

## 2013-04-12 MED ORDER — BISACODYL 10 MG RE SUPP: 10.0000 mg | Freq: Every day | RECTAL | Status: DC

## 2013-04-12 MED ORDER — METOPROLOL TARTRATE 25 MG/10 ML ORAL SUSPENSION
12.5000 mg | Freq: Two times a day (BID) | ORAL | Status: DC
  Administered 2013-04-12: 12.5 mg
  Filled 2013-04-12 (×3): qty 5

## 2013-04-12 MED ORDER — ACETAMINOPHEN 160 MG/5ML PO SOLN: 650.0000 mg | Freq: Once | ORAL | Status: AC

## 2013-04-12 MED ORDER — MORPHINE SULFATE 2 MG/ML IJ SOLN
1.0000 mg | INTRAMUSCULAR | Status: AC | PRN
  Administered 2013-04-12: 2 mg via INTRAVENOUS

## 2013-04-12 MED ORDER — NITROGLYCERIN IN D5W 200-5 MCG/ML-% IV SOLN: 0.0000 ug/min | INTRAVENOUS | Status: DC

## 2013-04-12 MED ORDER — MIDAZOLAM HCL 2 MG/2ML IJ SOLN: 2.0000 mg | INTRAMUSCULAR | Status: DC | PRN

## 2013-04-12 MED ORDER — METOPROLOL TARTRATE 12.5 MG HALF TABLET
12.5000 mg | ORAL_TABLET | Freq: Two times a day (BID) | ORAL | Status: DC
  Filled 2013-04-12 (×3): qty 1

## 2013-04-12 MED ORDER — LACTATED RINGERS IV SOLN: 500.0000 mL | Freq: Once | INTRAVENOUS | Status: AC | PRN

## 2013-04-12 MED ORDER — ASPIRIN EC 325 MG PO TBEC
325.0000 mg | DELAYED_RELEASE_TABLET | Freq: Every day | ORAL | Status: DC
  Filled 2013-04-12: qty 1

## 2013-04-12 MED ORDER — MAGNESIUM SULFATE 40 MG/ML IJ SOLN
4.0000 g | Freq: Once | INTRAMUSCULAR | Status: AC
  Administered 2013-04-12: 4 g via INTRAVENOUS
  Filled 2013-04-12: qty 100

## 2013-04-12 MED ORDER — OXYCODONE HCL 5 MG PO TABS
5.0000 mg | ORAL_TABLET | ORAL | Status: DC | PRN
  Administered 2013-04-13: 10 mg via ORAL
  Administered 2013-04-13: 5 mg via ORAL
  Filled 2013-04-12: qty 2
  Filled 2013-04-12: qty 1

## 2013-04-12 MED ORDER — TACROLIMUS 1 MG PO CAPS
5.0000 mg | ORAL_CAPSULE | Freq: Two times a day (BID) | ORAL | Status: DC
  Filled 2013-04-12 (×4): qty 5

## 2013-04-12 MED ORDER — ACETAMINOPHEN 160 MG/5ML PO SOLN
1000.0000 mg | Freq: Four times a day (QID) | ORAL | Status: DC
  Administered 2013-04-13 (×2): 1000 mg
  Filled 2013-04-12: qty 40.6

## 2013-04-12 MED ORDER — ASPIRIN 81 MG PO CHEW: 324.0000 mg | CHEWABLE_TABLET | Freq: Every day | ORAL | Status: DC

## 2013-04-12 MED ORDER — DOPAMINE-DEXTROSE 3.2-5 MG/ML-% IV SOLN
0.0000 ug/kg/min | INTRAVENOUS | Status: DC
  Administered 2013-04-12: 3 ug/kg/min via INTRAVENOUS

## 2013-04-12 MED ORDER — DEXTROSE 5 % IV SOLN
1.5000 g | Freq: Two times a day (BID) | INTRAVENOUS | Status: DC
  Filled 2013-04-12 (×2): qty 1.5

## 2013-04-12 MED ORDER — DEXMEDETOMIDINE HCL IN NACL 200 MCG/50ML IV SOLN
0.1000 ug/kg/h | INTRAVENOUS | Status: DC
  Filled 2013-04-12: qty 50

## 2013-04-12 MED ORDER — POTASSIUM CHLORIDE 10 MEQ/50ML IV SOLN: 10.0000 meq | INTRAVENOUS | Status: AC

## 2013-04-12 MED ORDER — DEXTROSE 5 % IV SOLN
1.5000 g | Freq: Two times a day (BID) | INTRAVENOUS | Status: AC
  Administered 2013-04-13 – 2013-04-14 (×4): 1.5 g via INTRAVENOUS
  Filled 2013-04-12 (×4): qty 1.5

## 2013-04-12 MED ORDER — PHENYLEPHRINE HCL 10 MG/ML IJ SOLN
0.0000 ug/min | INTRAVENOUS | Status: DC
  Filled 2013-04-12: qty 2

## 2013-04-12 MED ORDER — ROCURONIUM BROMIDE 100 MG/10ML IV SOLN
INTRAVENOUS | Status: DC | PRN
  Administered 2013-04-12: 25 mg via INTRAVENOUS
  Administered 2013-04-12: 20 mg via INTRAVENOUS
  Administered 2013-04-12: 80 mg via INTRAVENOUS

## 2013-04-12 MED ORDER — SODIUM CHLORIDE 0.9 % IV SOLN
INTRAVENOUS | Status: DC
  Filled 2013-04-12: qty 1

## 2013-04-12 MED ORDER — SODIUM CHLORIDE 0.9 % IJ SOLN
OROMUCOSAL | Status: DC | PRN
  Administered 2013-04-12 (×3): via TOPICAL

## 2013-04-12 MED ORDER — SODIUM CHLORIDE 0.9 % IR SOLN
Status: DC | PRN
  Administered 2013-04-12: 6000 mL

## 2013-04-12 MED ORDER — HEPARIN SODIUM (PORCINE) 1000 UNIT/ML IJ SOLN
INTRAMUSCULAR | Status: DC | PRN
  Administered 2013-04-12: 20000 [IU] via INTRAVENOUS

## 2013-04-12 MED ORDER — LACTATED RINGERS IV SOLN
INTRAVENOUS | Status: DC | PRN
  Administered 2013-04-12: 08:00:00 via INTRAVENOUS

## 2013-04-12 MED ORDER — BISACODYL 5 MG PO TBEC
10.0000 mg | DELAYED_RELEASE_TABLET | Freq: Every day | ORAL | Status: DC
  Administered 2013-04-13 – 2013-04-17 (×4): 10 mg via ORAL
  Filled 2013-04-12 (×5): qty 2

## 2013-04-12 MED ORDER — LACTATED RINGERS IV SOLN: INTRAVENOUS | Status: DC

## 2013-04-12 MED ORDER — METOPROLOL TARTRATE 1 MG/ML IV SOLN
2.5000 mg | INTRAVENOUS | Status: DC | PRN
  Administered 2013-04-15: 2.5 mg via INTRAVENOUS
  Administered 2013-04-16: 5 mg via INTRAVENOUS
  Administered 2013-04-16: 3 mg via INTRAVENOUS
  Filled 2013-04-12 (×3): qty 5

## 2013-04-12 MED ORDER — ACETAMINOPHEN 650 MG RE SUPP
650.0000 mg | Freq: Once | RECTAL | Status: AC
  Administered 2013-04-12: 650 mg via RECTAL

## 2013-04-12 MED ORDER — ALBUMIN HUMAN 5 % IV SOLN
250.0000 mL | INTRAVENOUS | Status: AC | PRN
  Administered 2013-04-12 (×2): 250 mL via INTRAVENOUS
  Filled 2013-04-12: qty 250

## 2013-04-12 MED ORDER — SODIUM CHLORIDE 0.45 % IV SOLN
INTRAVENOUS | Status: DC
  Administered 2013-04-12: 14:00:00 via INTRAVENOUS

## 2013-04-12 MED ORDER — FAMOTIDINE IN NACL 20-0.9 MG/50ML-% IV SOLN
20.0000 mg | Freq: Two times a day (BID) | INTRAVENOUS | Status: AC
  Administered 2013-04-12 (×2): 20 mg via INTRAVENOUS
  Filled 2013-04-12: qty 50

## 2013-04-12 MED ORDER — SODIUM CHLORIDE 0.9 % IV SOLN: INTRAVENOUS | Status: AC

## 2013-04-12 MED ORDER — ARTIFICIAL TEARS OP OINT
TOPICAL_OINTMENT | OPHTHALMIC | Status: DC | PRN
  Administered 2013-04-12: 1 via OPHTHALMIC

## 2013-04-12 MED ORDER — PANTOPRAZOLE SODIUM 40 MG PO TBEC
40.0000 mg | DELAYED_RELEASE_TABLET | Freq: Every day | ORAL | Status: DC
  Administered 2013-04-14: 40 mg via ORAL
  Filled 2013-04-12: qty 1

## 2013-04-12 MED ORDER — ALBUMIN HUMAN 5 % IV SOLN
INTRAVENOUS | Status: DC | PRN
  Administered 2013-04-12 (×3): via INTRAVENOUS

## 2013-04-12 MED ORDER — FENTANYL CITRATE 0.05 MG/ML IJ SOLN
INTRAMUSCULAR | Status: DC | PRN
  Administered 2013-04-12 (×3): 150 ug via INTRAVENOUS
  Administered 2013-04-12: 350 ug via INTRAVENOUS
  Administered 2013-04-12: 400 ug via INTRAVENOUS
  Administered 2013-04-12: 250 ug via INTRAVENOUS
  Administered 2013-04-12: 200 ug via INTRAVENOUS
  Administered 2013-04-12: 100 ug via INTRAVENOUS

## 2013-04-12 MED ORDER — PROTAMINE SULFATE 10 MG/ML IV SOLN
INTRAVENOUS | Status: DC | PRN
  Administered 2013-04-12: 10 mg via INTRAVENOUS
  Administered 2013-04-12 (×2): 50 mg via INTRAVENOUS
  Administered 2013-04-12: 40 mg via INTRAVENOUS
  Administered 2013-04-12: 50 mg via INTRAVENOUS

## 2013-04-12 MED ORDER — MIDAZOLAM HCL 5 MG/5ML IJ SOLN
INTRAMUSCULAR | Status: DC | PRN
  Administered 2013-04-12: 2 mg via INTRAVENOUS
  Administered 2013-04-12: 3 mg via INTRAVENOUS
  Administered 2013-04-12: 1 mg via INTRAVENOUS
  Administered 2013-04-12: 2 mg via INTRAVENOUS
  Administered 2013-04-12: 1 mg via INTRAVENOUS
  Administered 2013-04-12: 2 mg via INTRAVENOUS
  Administered 2013-04-12: 3 mg via INTRAVENOUS

## 2013-04-12 MED ORDER — VANCOMYCIN HCL IN DEXTROSE 1-5 GM/200ML-% IV SOLN
1000.0000 mg | Freq: Once | INTRAVENOUS | Status: AC
  Administered 2013-04-12: 1000 mg via INTRAVENOUS
  Filled 2013-04-12: qty 200

## 2013-04-12 MED ORDER — SODIUM CHLORIDE 0.9 % IV SOLN
250.0000 mL | INTRAVENOUS | Status: DC
  Administered 2013-04-13: 250 mL via INTRAVENOUS

## 2013-04-12 MED ORDER — MORPHINE SULFATE 2 MG/ML IJ SOLN
2.0000 mg | INTRAMUSCULAR | Status: DC | PRN
  Administered 2013-04-12: 4 mg via INTRAVENOUS
  Administered 2013-04-13 (×2): 2 mg via INTRAVENOUS
  Filled 2013-04-12 (×2): qty 1
  Filled 2013-04-12: qty 2
  Filled 2013-04-12: qty 1

## 2013-04-12 MED ORDER — ONDANSETRON HCL 4 MG/2ML IJ SOLN
4.0000 mg | Freq: Four times a day (QID) | INTRAMUSCULAR | Status: DC | PRN
  Administered 2013-04-13: 4 mg via INTRAVENOUS
  Filled 2013-04-12: qty 2

## 2013-04-12 MED ORDER — SODIUM CHLORIDE 0.9 % IV SOLN
INTRAVENOUS | Status: DC
  Administered 2013-04-12: 14:00:00 via INTRAVENOUS

## 2013-04-12 MED ORDER — ACETAMINOPHEN 500 MG PO TABS
1000.0000 mg | ORAL_TABLET | Freq: Four times a day (QID) | ORAL | Status: DC
  Administered 2013-04-13 – 2013-04-14 (×3): 1000 mg via ORAL
  Filled 2013-04-12 (×5): qty 2

## 2013-04-12 MED FILL — Aminocaproic Acid Inj 250 MG/ML: INTRAVENOUS | Qty: 40 | Status: AC

## 2013-04-12 MED FILL — Cefuroxime Sodium For Inj 750 MG: INTRAMUSCULAR | Qty: 750 | Status: AC

## 2013-04-12 MED FILL — Magnesium Sulfate Inj 50%: INTRAMUSCULAR | Qty: 10 | Status: AC

## 2013-04-12 MED FILL — Potassium Chloride Inj 2 mEq/ML: INTRAVENOUS | Qty: 40 | Status: AC

## 2013-04-12 MED FILL — Insulin Regular (Human) Inj 100 Unit/ML: INTRAMUSCULAR | Qty: 1 | Status: AC

## 2013-04-12 MED FILL — Sodium Chloride IV Soln 0.9%: INTRAVENOUS | Qty: 100 | Status: AC

## 2013-04-12 MED FILL — Sodium Chloride IV Soln 0.9%: INTRAVENOUS | Qty: 1000 | Status: AC

## 2013-04-12 MED FILL — Heparin Sodium (Porcine) Inj 1000 Unit/ML: INTRAMUSCULAR | Qty: 30 | Status: AC

## 2013-04-12 SURGICAL SUPPLY — 107 items
ADAPTER CARDIO PERF ANTE/RETRO (ADAPTER) ×6 IMPLANT
ADPR PRFSN 84XANTGRD RTRGD (ADAPTER) ×4
ATRICLIP EXCLUSION 45 FLEX HDL (Clip) ×1 IMPLANT
ATTRACTOMAT 16X20 MAGNETIC DRP (DRAPES) ×6 IMPLANT
BAG DECANTER FOR FLEXI CONT (MISCELLANEOUS) ×3 IMPLANT
BLADE STERNUM SYSTEM 6 (BLADE) ×6 IMPLANT
BLADE SURG 11 STRL SS (BLADE) ×7 IMPLANT
CANISTER SUCTION 2500CC (MISCELLANEOUS) ×6 IMPLANT
CANN PRFSN 3/8X14X24FR PCFC (MISCELLANEOUS)
CANN PRFSN 3/8XCNCT ST RT ANG (MISCELLANEOUS)
CANNULA GUNDRY RCSP 15FR (MISCELLANEOUS) ×6 IMPLANT
CANNULA PRFSN 3/8X14X24FR PCFC (MISCELLANEOUS) IMPLANT
CANNULA PRFSN 3/8XCNCT RT ANG (MISCELLANEOUS) IMPLANT
CANNULA SOFTFLOW AORTIC 7M21FR (CANNULA) ×3 IMPLANT
CANNULA VEN MTL TIP RT (MISCELLANEOUS)
CATH CPB KIT OWEN (MISCELLANEOUS) ×3 IMPLANT
CATH HEART VENT LEFT (CATHETERS) ×2 IMPLANT
CATH THORACIC 28FR RT ANG (CATHETERS) IMPLANT
CATH THORACIC 36FR (CATHETERS) IMPLANT
CATH THORACIC 36FR RT ANG (CATHETERS) ×3 IMPLANT
CLAMP ISOLATOR SYNERGY LG (MISCELLANEOUS) ×3 IMPLANT
CLIP FOGARTY SPRING 6M (CLIP) IMPLANT
CONN 1/2X1/2X1/2  BEN (MISCELLANEOUS) ×1
CONN 1/2X1/2X1/2 BEN (MISCELLANEOUS) ×2 IMPLANT
CONN 3/8X1/2 ST GISH (MISCELLANEOUS) ×6 IMPLANT
CONN ST 1/4X3/8  BEN (MISCELLANEOUS) ×2
CONN ST 1/4X3/8 BEN (MISCELLANEOUS) IMPLANT
COVER SURGICAL LIGHT HANDLE (MISCELLANEOUS) ×6 IMPLANT
CRADLE DONUT ADULT HEAD (MISCELLANEOUS) ×6 IMPLANT
DRAIN CHANNEL 32F RND 10.7 FF (WOUND CARE) ×6 IMPLANT
DRAPE BILATERAL SPLIT (DRAPES) IMPLANT
DRAPE CARDIOVASCULAR INCISE (DRAPES)
DRAPE CV SPLIT W-CLR ANES SCRN (DRAPES) IMPLANT
DRAPE INCISE IOBAN 66X45 STRL (DRAPES) ×3 IMPLANT
DRAPE SLUSH/WARMER DISC (DRAPES) ×6 IMPLANT
DRAPE SRG 135X102X78XABS (DRAPES) IMPLANT
DRSG COVADERM 4X14 (GAUZE/BANDAGES/DRESSINGS) ×5 IMPLANT
ELECT REM PT RETURN 9FT ADLT (ELECTROSURGICAL) ×12
ELECTRODE REM PT RTRN 9FT ADLT (ELECTROSURGICAL) ×8 IMPLANT
GLOVE BIO SURGEON STRL SZ 6 (GLOVE) ×9 IMPLANT
GLOVE BIO SURGEON STRL SZ 6.5 (GLOVE) IMPLANT
GLOVE BIO SURGEON STRL SZ7.5 (GLOVE) IMPLANT
GLOVE BIO SURGEON STRL SZ8 (GLOVE) ×4 IMPLANT
GLOVE BIOGEL PI IND STRL 7.0 (GLOVE) IMPLANT
GLOVE BIOGEL PI INDICATOR 7.0 (GLOVE) ×4
GLOVE ORTHO TXT STRL SZ7.5 (GLOVE) ×9 IMPLANT
GOWN STRL NON-REIN LRG LVL3 (GOWN DISPOSABLE) ×24 IMPLANT
HEMOSTAT POWDER SURGIFOAM 1G (HEMOSTASIS) ×18 IMPLANT
INSERT FOGARTY XLG (MISCELLANEOUS) ×6 IMPLANT
KIT BASIN OR (CUSTOM PROCEDURE TRAY) ×6 IMPLANT
KIT DRAINAGE VACCUM ASSIST (KITS) ×1 IMPLANT
KIT ROOM TURNOVER OR (KITS) ×6 IMPLANT
KIT SUCTION CATH 14FR (SUCTIONS) ×19 IMPLANT
LEAD PACING MYOCARDI (MISCELLANEOUS) ×3 IMPLANT
LINE EXTENSION DELIVERY (MISCELLANEOUS) ×1 IMPLANT
LINE VENT (MISCELLANEOUS) ×1 IMPLANT
LOOP VESSEL SUPERMAXI WHITE (MISCELLANEOUS) ×4 IMPLANT
NS IRRIG 1000ML POUR BTL (IV SOLUTION) ×30 IMPLANT
PACK OPEN HEART (CUSTOM PROCEDURE TRAY) ×6 IMPLANT
PAD ARMBOARD 7.5X6 YLW CONV (MISCELLANEOUS) ×12 IMPLANT
PROBE CRYO2-ABLATION MALLABLE (MISCELLANEOUS) ×1 IMPLANT
SET CARDIOPLEGIA MPS 5001102 (MISCELLANEOUS) ×1 IMPLANT
SET IRRIG TUBING LAPAROSCOPIC (IRRIGATION / IRRIGATOR) ×5 IMPLANT
SOLUTION ANTI FOG 6CC (MISCELLANEOUS) ×1 IMPLANT
SPONGE GAUZE 4X4 12PLY (GAUZE/BANDAGES/DRESSINGS) ×9 IMPLANT
SUCKER INTRACARDIAC WEIGHTED (SUCKER) ×3 IMPLANT
SUT BONE WAX W31G (SUTURE) ×6 IMPLANT
SUT ETHIBON 2 0 V 52N 30 (SUTURE) ×6 IMPLANT
SUT ETHIBON EXCEL 2-0 V-5 (SUTURE) IMPLANT
SUT ETHIBOND 2 0 SH (SUTURE) ×6
SUT ETHIBOND 2 0 SH 36X2 (SUTURE) ×4 IMPLANT
SUT ETHIBOND 2 0 V4 (SUTURE) IMPLANT
SUT ETHIBOND 2 0V4 GREEN (SUTURE) IMPLANT
SUT ETHIBOND 4 0 RB 1 (SUTURE) ×5 IMPLANT
SUT ETHIBOND V-5 VALVE (SUTURE) IMPLANT
SUT ETHIBOND X763 2 0 SH 1 (SUTURE) ×15 IMPLANT
SUT MNCRL AB 3-0 PS2 18 (SUTURE) ×12 IMPLANT
SUT PDS AB 1 CTX 36 (SUTURE) ×12 IMPLANT
SUT PROLENE 3 0 SH 1 (SUTURE) ×4 IMPLANT
SUT PROLENE 3 0 SH DA (SUTURE) ×8 IMPLANT
SUT PROLENE 4 0 RB 1 (SUTURE) ×21
SUT PROLENE 4 0 SH DA (SUTURE) ×9 IMPLANT
SUT PROLENE 4-0 RB1 .5 CRCL 36 (SUTURE) ×8 IMPLANT
SUT PROLENE 5 0 C 1 36 (SUTURE) IMPLANT
SUT PROLENE 6 0 C 1 30 (SUTURE) ×1 IMPLANT
SUT SILK  1 MH (SUTURE) ×5
SUT SILK 1 MH (SUTURE) ×4 IMPLANT
SUT SILK 2 0 SH CR/8 (SUTURE) IMPLANT
SUT SILK 3 0 SH CR/8 (SUTURE) IMPLANT
SUT STEEL 6MS V (SUTURE) IMPLANT
SUT STEEL STERNAL CCS#1 18IN (SUTURE) ×1 IMPLANT
SUT STEEL SZ 6 DBL 3X14 BALL (SUTURE) ×2 IMPLANT
SUT VIC AB 1 CTX 18 (SUTURE) ×1 IMPLANT
SUT VIC AB 2-0 CTX 27 (SUTURE) IMPLANT
SUTURE E-PAK OPEN HEART (SUTURE) ×3 IMPLANT
SYS ATRICLIP LAA EXCLUSION 45 (CLIP) IMPLANT
SYSTEM SAHARA CHEST DRAIN ATS (WOUND CARE) ×6 IMPLANT
TABLE PACK (MISCELLANEOUS) ×1 IMPLANT
TAPE CLOTH SURG 4X10 WHT LF (GAUZE/BANDAGES/DRESSINGS) ×1 IMPLANT
TOWEL OR 17X24 6PK STRL BLUE (TOWEL DISPOSABLE) ×12 IMPLANT
TOWEL OR 17X26 10 PK STRL BLUE (TOWEL DISPOSABLE) ×12 IMPLANT
TRAY FOLEY IC TEMP SENS 14FR (CATHETERS) ×6 IMPLANT
TUBE SUCT INTRACARD DLP 20F (MISCELLANEOUS) ×3 IMPLANT
UNDERPAD 30X30 INCONTINENT (UNDERPADS AND DIAPERS) ×6 IMPLANT
VALVE MAGNA EASE AORTIC 23MM (Prosthesis & Implant Heart) ×1 IMPLANT
VENT LEFT HEART 12002 (CATHETERS) ×3
WATER STERILE IRR 1000ML POUR (IV SOLUTION) ×12 IMPLANT

## 2013-04-12 NOTE — OR Nursing (Signed)
Second call to SICU charge nurse made.

## 2013-04-12 NOTE — Progress Notes (Signed)
[facility] [place]  Subjective  - POD #1  S/p removal of sheath from right carotid artery C/o being hungry and a dry throat  Tolerated tomato soup yesterday without choking / aspiration   Physical Exam:  Neurologically intact Strength equal bilaterally       Assessment/Plan:  POD #1  Doing well POD #1 For AVR / MAZE today No concerns from vascular perspective  BRABHAM IV, V. WELLS 04/12/2013 7:09 AM --  Filed Vitals:   04/12/13 0600  BP:   Pulse:   Temp:   Resp: 17    Intake/Output Summary (Last 24 hours) at 04/12/13 0709 Last data filed at 04/12/13 0500  Gross per 24 hour  Intake   2850 ml  Output   2395 ml  Net    455 ml     Laboratory CBC    Component Value Date/Time   WBC 11.7* 04/12/2013 0400   HGB 10.9* 04/12/2013 0400   HCT 33.3* 04/12/2013 0400   PLT 208 04/12/2013 0400    BMET    Component Value Date/Time   NA 142 04/12/2013 0400   K 4.4 04/12/2013 0400   CL 105 04/12/2013 0400   CO2 26 04/12/2013 0400   GLUCOSE 140* 04/12/2013 0400   GLUCOSE 126* 06/13/2006 1525   BUN 23 04/12/2013 0400   CREATININE 1.26* 04/12/2013 0400   CREATININE 1.35* 03/30/2013 1634   CALCIUM 8.7 04/12/2013 0400   CALCIUM 9.7 01/29/2008 1338   GFRNONAA 42* 04/12/2013 0400   GFRAA 49* 04/12/2013 0400    COAG Lab Results  Component Value Date   INR 1.08 04/07/2013   INR 1.12 04/05/2013   INR 2.3 03/30/2013   No results found for this basename: PTT    Antibiotics Anti-infectives   Start     Dose/Rate Route Frequency Ordered Stop   04/12/13 0400  vancomycin (VANCOCIN) 1,250 mg in sodium chloride 0.9 % 250 mL IVPB     1,250 mg 166.7 mL/hr over 90 Minutes Intravenous To Surgery 04/11/13 2113 04/13/13 0400   04/12/13 0400  cefUROXime (ZINACEF) 1.5 g in dextrose 5 % 50 mL IVPB     1.5 g 100 mL/hr over 30 Minutes Intravenous To Surgery 04/11/13 2113 04/13/13 0400   04/12/13 0400  cefUROXime (ZINACEF) 750 mg in dextrose 5 % 50 mL IVPB     750  mg 100 mL/hr over 30 Minutes Intravenous To Surgery 04/11/13 2055 04/13/13 0400   04/12/13 0400  [MAR Hold]  vancomycin (VANCOCIN) 1,000 mg in sodium chloride 0.9 % 1,000 mL irrigation     (On MAR Hold since 04/12/13 0611)    Irrigation To Surgery 04/11/13 2113 04/13/13 0400   04/12/13 0400  vancomycin (VANCOCIN) 1,000 mg in sodium chloride 0.9 % 1,000 mL irrigation      Irrigation To Surgery 04/11/13 2115 04/13/13 0400   04/11/13 1800  [MAR Hold]  cefUROXime (ZINACEF) 1.5 g in dextrose 5 % 50 mL IVPB     (On MAR Hold since 04/12/13 0611)   1.5 g 100 mL/hr over 30 Minutes Intravenous Every 12 hours 04/11/13 1537 04/12/13 1759   04/11/13 0400  vancomycin (VANCOCIN) 1,500 mg in sodium chloride 0.9 % 250 mL IVPB     1,500 mg 125 mL/hr over 120 Minutes Intravenous To Surgery 04/10/13 1407 04/11/13 0934   04/11/13 0400  cefUROXime (ZINACEF) 1.5 g in dextrose 5 % 50 mL IVPB     1.5 g 100 mL/hr over 30 Minutes Intravenous To Surgery  04/10/13 1407 04/11/13 0935   04/11/13 0400  cefUROXime (ZINACEF) 750 mg in dextrose 5 % 50 mL IVPB  Status:  Discontinued     750 mg 100 mL/hr over 30 Minutes Intravenous To Surgery 04/10/13 1406 04/11/13 1506   04/11/13 0400  vancomycin (VANCOCIN) 1,000 mg in sodium chloride 0.9 % 1,000 mL irrigation  Status:  Discontinued      Irrigation To Surgery 04/10/13 1406 04/11/13 1506   04/06/13 1000  dapsone tablet 25 mg  Status:  Discontinued     25 mg Oral Daily 04/05/13 1413 04/11/13 1728       V. Leia Alf, M.D. [facility] [place] Office: [phone] Pager:  [phone]

## 2013-04-12 NOTE — OR Nursing (Signed)
Volunteer desk called and SICU charge nurse called and 45 minute call given.

## 2013-04-12 NOTE — Op Note (Signed)
CARDIOTHORACIC SURGERY OPERATIVE NOTE  Date of Procedure:  04/12/2013  Preoperative Diagnosis:   Severe Aortic Stenosis   Recurrent Persistent Atrial Fibrillation  Postoperative Diagnosis: Same   Procedure:    Aortic Valve Replacement  Edwards Magna Ease Pericardial Tissue Valve (size 23 mm, model # 3300TFX, serial # [id number])   Maze Procedure  Complete biatrial lesion set using cryothermy and bipolar radiofrequency ablation  Obliteration of Left Atrial Appendage (Atriclip, size 61mm)    Surgeon: Valentina Gu. Roxy Manns, MD  Assistant: John Giovanni, PA-C  Anesthesia: Delma Freeze, MD  Operative Findings: Severe calcific aortic stenosis  Normal LV systolic function  Severe LV hypertrophy with diastolic dysfunction  Mild mitral regurgitation  Mild tricuspid regurgitation        BRIEF CLINICAL NOTE AND INDICATIONS FOR SURGERY  Patient is a 67 year old African American female from [place] with history of aortic stenosis, coronary artery disease, previous stroke, chronic persistent atrial fibrillation on Coumadin, type 2 diabetes mellitus, chronic kidney disease status post cadaveric renal transplantation in 2012, and pericardial effusion who has been followed for several years by Dr. [name] for her underlying cardiac disease including aortic stenosis, coronary artery disease, and atrial fibrillation. She suffered an acute inferior wall myocardial infarction in 2003 for which she was treated with PCI and stenting of the right coronary artery. The patient's most recent echocardiogram was performed in may of 2014. At that time she was noted to have significant progression of the severity of aortic stenosis with peak velocity across the valve measured consistently greater than 4 m/s corresponding to a estimated mean transvalvular gradient of 40 mm mercury. Left ventricular systolic function remained preserved with ejection fraction estimated 55-60%. She was subsequently seen  in follow up by Dr. [name] on 02/12/2013 at which time discussions were entertained regarding the possibility of elective surgical intervention. The patient was admitted for elective left and right heart catheterization today and surgical consultation was requested.  The patient has been seen in consultation and counseled at length regarding the indications, risks and potential benefits of surgery.  All questions have been answered, and the patient provides full informed consent for the operation as described.     DETAILS OF THE OPERATIVE PROCEDURE  Preparation:  The patient is brought to the operating room on the above mentioned date and central monitoring was established by the anesthesia team including placement of Swan-Ganz catheter and radial arterial line. The patient is placed in the supine position on the operating table.  Intravenous antibiotics are administered. General endotracheal anesthesia is induced uneventfully. A Foley catheter is placed.  Baseline transesophageal echocardiogram was performed.  Findings were notable for severe calcific aortic stenosis with normal LV systolic function, severe LV hypertrophy, mild mitral regurgitation, and mild tricuspid regurgitation.  The patient's chest, abdomen, both groins, and both lower extremities are prepared and draped in a sterile manner. A time out procedure is performed.   Surgical Approach:  A median sternotomy incision was performed and the pericardium is opened. The ascending aorta is mildly sclerotic in appearance.    Extracorporeal Cardiopulmonary Bypass and Myocardial Protection:  The patient is heparinized systemically.  The right common femoral vein is cannulated with the Seldinger technique and a guidewire is advanced under transesophageal echocardiogram guidance through the right atrium. The femoral vein is cannulated with a long 22 French femoral venous cannula.  The ascending aorta and the right atrium are cannulated  for cardiopulmonary bypass.  Adequate heparinization is verified.   A retrograde cardioplegia cannula  is placed through the right atrium into the coronary sinus.  The operative field was continuously flooded with carbon dioxide gas.  The entire pre-bypass portion of the operation was notable for stable hemodynamics.  Cardiopulmonary bypass was begun and the surface of the heart is inspected.  Vacuum assist venous drainage is utilized.  A second venous cannula is placed directly into the superior vena cava.  A cardioplegia cannula is placed in the ascending aorta.  A temperature probe was placed in the interventricular septum.  The patient is cooled to 32C systemic temperature.  The aortic cross clamp is applied and cold blood cardioplegia is delivered initially in an antegrade fashion through the aortic root.  Supplemental cardioplegia is given retrograde through the coronary sinus catheter.  Iced saline slush is applied for topical hypothermia.  The initial cardioplegic arrest is rapid with early diastolic arrest.  Repeat doses of cardioplegia are administered intermittently throughout the entire cross clamp portion of the operation through the aortic root and the coronary sinus catheter in order to maintain completely flat electrocardiogram and septal myocardial temperature below 15C.  Myocardial protection was felt to be excellent.   Maze Procedure (left atrial lesion set):  The AtriCure Synergy bipolar radiofrequency ablation clamp is used for all radiofrequency ablation lesions for the maze procedure.  The Atricure CryoICE nitrous oxide cryothermy system is utilized for all cryothermy ablation lesions.   The heart is retracted towards the surgeon's side and the left sided pulmonary veins exposed.  An elliptical ablation lesion is created around the base of the left sided pulmonary veins.  A similar elliptical lesion was created around the base of the left atrial appendage.  The left atrial  appendage was obliterated using an Atricure left atrial appendage clip (Atriclip, size 96mm).  The heart was replaced into the pericardial sac.  A left atriotomy incision was performed through the interatrial groove and extended partially across the back wall of the left atrium after opening the oblique sinus inferiorly.  The floor of the left atrium and the mitral valve were exposed using a self-retaining retractor.    An ablation lesion was placed around the right sided pulmonary veins using the bipolar clamp with one limb of the clamp along the endocardial surface and one along the epicardial surface posteriorly.  A bipolar ablation lesion was placed across the dome of the left atrium from the cephalad apex of the atriotomy incision to reach the cephalad apex of the elliptical lesion around the left sided pulmonary veins.  A similar bipolar lesion was placed across the back wall of the left atrium from the caudad apex of the atriotomy incision to reach the caudad apex of the elliptical lesion around the left sided pulmonary veins, thereby completing a box.  Finally another bipolar lesion was placed across the back wall of the left atrium from the caudad apex of the atriotomy incision towards the posterior mitral valve annulus.  This lesion was completed along the endocardial surface onto the posterior mitral annulus with a 3 minute duration cryothermy lesion, followed by a second cryothermy lesion along the posterior epicardial surface of the left atrium to the coronary sinus.  This completes the entire left side lesion set of the Cox maze procedure.  The left atriotomy was closed using a 2-layer closure of running 3-0 Prolene suture after placing a sump drain across the mitral valve to serve as a left ventricular vent.     Aortic Valve Replacement:  An oblique transverse aortotomy incision was  performed.  The aortic valve was inspected and notable for severe aortic stenosis.  The aortic valve leaflets  were excised sharply and the aortic annulus decalcified.  Decalcification was notably straightforward.  The aortic annulus was sized to accept a 23 mm prosthesis.  The aortic root and left ventricle were irrigated with copious cold saline solution.  Aortic valve replacement was performed using interrupted horizontal mattress 2-0 Ethibond pledgeted sutures with pledgets in the subannular position.  An [facility] Ease pericardial tissue valve (size 23 mm, model # 3300TFX, serial # [id number]) was implanted uneventfully. The valve seated appropriately with adequate space beneath the left main and right coronary artery.  The aortotomy was closed using a 2-layer closure of running 4-0 Prolene suture.  One final dose of warm retrograde "hot shot" cardioplegia was administered retrograde through the coronary sinus catheter while all air was evacuated through the aortic root.  The aortic cross clamp was removed after a total cross clamp time of 108 minutes.   Maze Procedure (right atrial lesion set):  The retrograde cardioplegia cannula was removed and the small hole in the right atrium extended a short distance.  The AtriCure Synergy bipolar radiofrequency ablation clamp is utilized to create a series of linear lesions in the right atrium, each with one limb of the clamp along the endocardial surface and the other along the epicardial surface. The first lesion is placed from the posterior apex of the atriotomy incision and along the lateral wall of the right atrium to reach the lateral aspect of the superior vena cava. A second lesion is placed in the opposite direction from the posterior apex of the atriotomy incision along the lateral wall to reach the lateral aspect of the inferior vena cava. A third lesion is placed from the midportion of the atriotomy incision extending at a right angle to reach the tip of the right atrial appendage. A fourth lesion is placed from the anterior apex of the atriotomy incision  in an anterior and inferior direction to reach the acute margin of the heart. Finally, the cryotherapy probe is utilized to complete the right atrial lesion set by placing the probe along the endocardial surface of the right atrium from the anterior apex of the atriotomy incision to reach the tricuspid annulus at the 2:00 position. The right atriotomy incision is closed with a 2 layer closure of running 4-0 Prolene suture.   Procedure Completion:  Epicardial pacing wires are fixed to the right ventricular outflow tract and to the right atrial appendage. The patient is rewarmed to 37C temperature. The left ventricular vent is removed.  The patient is weaned and disconnected from cardiopulmonary bypass.  The patient's rhythm at separation from bypass was AV paced.  The patient was weaned from cardioplegic bypass without any inotropic support. Total cardiopulmonary bypass time for the operation was 139 minutes.  Followup transesophageal echocardiogram performed after separation from bypass revealed a well-seated aortic valve prosthesis that was functioning normally and without any sign of perivalvular leak.  Left ventricular function was unchanged from preoperatively.  The superior vena cava cannulae was removed uneventfully. The aortic cannula was removed.  Two additional pledgeted sutures were necessary to achieve hemostasis.  Protamine was administered to reverse the anticoagulation. The femoral venous cannula was removed and manual pressure held on the groin for 30 minutes.  The mediastinum and pleural space were inspected for hemostasis and irrigated with saline solution. The mediastinum and both pleural spaces were drained using 4 chest tubes placed through  separate stab incisions inferiorly.  The soft tissues anterior to the aorta were reapproximated loosely. The sternum is closed with double strength sternal wire. The soft tissues anterior to the sternum were closed in multiple layers and the skin is  closed with a running subcuticular skin closure.  The post-bypass portion of the operation was notable for stable rhythm and hemodynamics.  No blood products were administered during the operation.   Disposition:  The patient tolerated the procedure well and is transported to the surgical intensive care in stable condition. There are no intraoperative complications. All sponge instrument and needle counts are verified correct at completion of the operation.    Valentina Gu. Roxy Manns MD 04/12/2013 1:07 PM

## 2013-04-12 NOTE — Preoperative (Signed)
Beta Blockers   Reason not to administer Beta Blockers:Not Applicable 

## 2013-04-12 NOTE — Anesthesia Preprocedure Evaluation (Addendum)
Anesthesia Evaluation  Patient identified by MRN, date of birth, ID band Patient awake    Reviewed: Allergy & Precautions, H&P , NPO status , Patient's Chart, lab work & pertinent test results, reviewed documented beta blocker date and time   History of Anesthesia Complications Negative for: history of anesthetic complications  Airway Mallampati: II TM Distance: >3 FB Neck ROM: full    Dental  (+) Teeth Intact and Dental Advidsory Given   Pulmonary sleep apnea ,    Pulmonary exam normal       Cardiovascular hypertension, + CAD, + Past MI, + Cardiac Stents, + Peripheral Vascular Disease and +CHF + dysrhythmias Atrial Fibrillation Rhythm:irregular + Systolic murmurs Left ventricle: The cavity size was mildly dilated. Wall   thickness was increased in a pattern of moderate LVH.   Systolic function was normal. The estimated ejection   fraction was in the range of 55% to 60%.    Neuro/Psych PSYCHIATRIC DISORDERS CVA negative psych ROS   GI/Hepatic GERD-  Medicated,  Endo/Other  diabetes  Renal/GU CRF and ESRFRenal diseaseRenal transplant     Musculoskeletal   Abdominal   Peds  Hematology  (+) anemia ,   Anesthesia Other Findings   Reproductive/Obstetrics                        Anesthesia Physical Anesthesia Plan  ASA: IV  Anesthesia Plan: General   Post-op Pain Management:    Induction: Intravenous  Airway Management Planned: Oral ETT  Additional Equipment: Arterial line and PA Cath  Intra-op Plan:   Post-operative Plan: Post-operative intubation/ventilation  Informed Consent: I have reviewed the patients History and Physical, chart, labs and discussed the procedure including the risks, benefits and alternatives for the proposed anesthesia with the patient or authorized representative who has indicated his/her understanding and acceptance.   Dental Advisory Given and Dental advisory  given  Plan Discussed with: CRNA, Anesthesiologist and Surgeon  Anesthesia Plan Comments: (PAC, A-line in place on arrival today.  These were inserted for case on 04/11/2013)      Anesthesia Quick Evaluation

## 2013-04-12 NOTE — Brief Op Note (Addendum)
      [address]       [place] [address]             [phone]     04/05/2013 - 04/12/2013  11:38 AM  PATIENT:  [name]  67 y.o. female  PRE-OPERATIVE DIAGNOSIS:  aortic stenosis; a fib  POST-OPERATIVE DIAGNOSIS:  aortic stenosis; a fib  PROCEDURE:  Procedure(s): AORTIC VALVE REPLACEMENT (AVR)#23 MAGNAEASE MAZE INTRAOPERATIVE TRANSESOPHAGEAL ECHOCARDIOGRAM  SURGEON:    Rexene Alberts, MD  ASSISTANTS:  John Giovanni, PA-C  ANESTHESIA:   Delma Freeze, MD  CROSSCLAMP TIME:   72'  CARDIOPULMONARY BYPASS TIME: 139'  FINDINGS:  Severe calcific aortic stenosis  Normal LV systolic function  Severe LV hypertrophy with diastolic dysfunction  Mild mitral regurgitation  Mild tricuspid regurgitation  COMPLICATIONS: none  PRE-OPERATIVE WEIGHT: 85kg  PATIENT DISPOSITION:   TO SICU IN STABLE CONDITION  [name],[name] 04/12/2013 1:04 PM

## 2013-04-12 NOTE — Anesthesia Postprocedure Evaluation (Signed)
  Anesthesia Post-op Note  Patient: Wenona A Bessent  Procedure(s) Performed: Procedure(s): AORTIC VALVE REPLACEMENT (AVR) (N/A) MAZE (N/A) INTRAOPERATIVE TRANSESOPHAGEAL ECHOCARDIOGRAM (N/A)  Patient Location: SICU  Anesthesia Type:General  Level of Consciousness: Patient remains intubated per anesthesia plan  Airway and Oxygen Therapy: Patient remains intubated per anesthesia plan and Patient placed on Ventilator (see vital sign flow sheet for setting)  Post-op Pain: mild  Post-op Assessment: Post-op Vital signs reviewed, Patient's Cardiovascular Status Stable, Respiratory Function Stable, Patent Airway and No signs of Nausea or vomiting  Post-op Vital Signs: Reviewed  Complications: No apparent anesthesia complications

## 2013-04-12 NOTE — Transfer of Care (Signed)
Immediate Anesthesia Transfer of Care Note  Patient: [name]  Procedure(s) Performed: Procedure(s): AORTIC VALVE REPLACEMENT (AVR) (N/A) MAZE (N/A) INTRAOPERATIVE TRANSESOPHAGEAL ECHOCARDIOGRAM (N/A)  Patient Location: SICU  Anesthesia Type:General  Level of Consciousness: Patient remains intubated per anesthesia plan  Airway & Oxygen Therapy: Patient remains intubated per anesthesia plan and Patient placed on Ventilator (see vital sign flow sheet for setting)  Post-op Assessment: Report given to PACU RN and Post -op Vital signs reviewed and stable  Post vital signs: Reviewed and stable  Complications: No apparent anesthesia complications

## 2013-04-12 NOTE — Progress Notes (Signed)
PM ROUNDS  Intubated, sedated  BP 96/66  Pulse 80  Temp(Src) 98.1 F (36.7 C) (Core (Comment))  Resp 12  Ht 5\' 2"  (1.575 m)  Wt 188 lb 4.4 oz (85.4 kg)  BMI 34.43 kg/m2  SpO2 100%  CI 2.1   Intake/Output Summary (Last 24 hours) at 04/12/13 1707 Last data filed at 04/12/13 1700  Gross per 24 hour  Intake 5129.53 ml  Output   4215 ml  Net 914.53 ml   K 4.3, Hct 25  Stable early postop

## 2013-04-12 NOTE — Anesthesia Procedure Notes (Signed)
Procedure Name: Intubation Date/Time: 04/12/2013 8:03 AM Performed by: Neldon Newport Pre-anesthesia Checklist: Patient identified, Timeout performed, Emergency Drugs available, Suction available and Patient being monitored Patient Re-evaluated:Patient Re-evaluated prior to inductionOxygen Delivery Method: Circle system utilized Preoxygenation: Pre-oxygenation with 100% oxygen Intubation Type: IV induction Ventilation: Mask ventilation without difficulty Laryngoscope Size: Mac and 3 Grade View: Grade II Tube type: Oral Tube size: 8.0 mm Number of attempts: 2 Placement Confirmation: breath sounds checked- equal and bilateral,  ETT inserted through vocal cords under direct vision and positive ETCO2 Secured at: 22 cm Tube secured with: Tape Dental Injury: Teeth and Oropharynx as per pre-operative assessment

## 2013-04-13 ENCOUNTER — Inpatient Hospital Stay (HOSPITAL_COMMUNITY): Payer: Medicare Other

## 2013-04-13 LAB — POCT I-STAT 3, ART BLOOD GAS (G3+)
Acid-base deficit: 7 mmol/L — ABNORMAL HIGH (ref 0.0–2.0)
Acid-base deficit: 5 mmol/L — ABNORMAL HIGH (ref 0.0–2.0)
Acid-base deficit: 6 mmol/L — ABNORMAL HIGH (ref 0.0–2.0)
Acid-base deficit: 6 mmol/L — ABNORMAL HIGH (ref 0.0–2.0)
Bicarbonate: 20.1 mEq/L (ref 20.0–24.0)
Bicarbonate: 20.6 mEq/L (ref 20.0–24.0)
Bicarbonate: 21.2 mEq/L (ref 20.0–24.0)
Bicarbonate: 20.4 mEq/L (ref 20.0–24.0)
Bicarbonate: 20.5 mEq/L (ref 20.0–24.0)
O2 Saturation: 94 %
O2 Saturation: 97 %
O2 Saturation: 99 %
O2 Saturation: 96 %
O2 Saturation: 99 %
Patient temperature: 36.6
TCO2: 21 mmol/L (ref 0–100)
TCO2: 22 mmol/L (ref 0–100)
TCO2: 22 mmol/L (ref 0–100)
TCO2: 22 mmol/L (ref 0–100)
TCO2: 23 mmol/L (ref 0–100)
pCO2 arterial: 33 mmHg — ABNORMAL LOW (ref 35.0–45.0)
pCO2 arterial: 46.4 mmHg — ABNORMAL HIGH (ref 35.0–45.0)
pCO2 arterial: 49.2 mmHg — ABNORMAL HIGH (ref 35.0–45.0)
pCO2 arterial: 32.6 mmHg — ABNORMAL LOW (ref 35.0–45.0)
pH, Arterial: 7.269 — ABNORMAL LOW (ref 7.350–7.450)
pO2, Arterial: 104 mmHg — ABNORMAL HIGH (ref 80.0–100.0)
pO2, Arterial: 122 mmHg — ABNORMAL HIGH (ref 80.0–100.0)
pO2, Arterial: 85 mmHg (ref 80.0–100.0)
pO2, Arterial: 97 mmHg (ref 80.0–100.0)
pO2, Arterial: 80 mmHg (ref 80.0–100.0)
pO2, Arterial: 118 mmHg — ABNORMAL HIGH (ref 80.0–100.0)

## 2013-04-13 LAB — POCT I-STAT 4, (NA,K, GLUC, HGB,HCT)
Glucose, Bld: 72 mg/dL (ref 70–99)
HCT: 26 % — ABNORMAL LOW (ref 36.0–46.0)
Hemoglobin: 8.8 g/dL — ABNORMAL LOW (ref 12.0–15.0)
Potassium: 4.2 mEq/L (ref 3.5–5.1)

## 2013-04-13 LAB — CBC
HCT: 27.7 % — ABNORMAL LOW (ref 36.0–46.0)
HCT: 28.7 % — ABNORMAL LOW (ref 36.0–46.0)
Hemoglobin: 9.4 g/dL — ABNORMAL LOW (ref 12.0–15.0)
MCH: 27.5 pg (ref 26.0–34.0)
MCV: 81 fL (ref 78.0–100.0)
MCV: 83.9 fL (ref 78.0–100.0)
RBC: 3.42 MIL/uL — ABNORMAL LOW (ref 3.87–5.11)
RBC: 3.42 MIL/uL — ABNORMAL LOW (ref 3.87–5.11)
RDW: 15.3 % (ref 11.5–15.5)
WBC: 19 10*3/uL — ABNORMAL HIGH (ref 4.0–10.5)

## 2013-04-13 LAB — GLUCOSE, CAPILLARY
Glucose-Capillary: 103 mg/dL — ABNORMAL HIGH (ref 70–99)
Glucose-Capillary: 105 mg/dL — ABNORMAL HIGH (ref 70–99)
Glucose-Capillary: 110 mg/dL — ABNORMAL HIGH (ref 70–99)
Glucose-Capillary: 114 mg/dL — ABNORMAL HIGH (ref 70–99)
Glucose-Capillary: 122 mg/dL — ABNORMAL HIGH (ref 70–99)
Glucose-Capillary: 128 mg/dL — ABNORMAL HIGH (ref 70–99)
Glucose-Capillary: 133 mg/dL — ABNORMAL HIGH (ref 70–99)
Glucose-Capillary: 133 mg/dL — ABNORMAL HIGH (ref 70–99)
Glucose-Capillary: 135 mg/dL — ABNORMAL HIGH (ref 70–99)
Glucose-Capillary: 139 mg/dL — ABNORMAL HIGH (ref 70–99)
Glucose-Capillary: 146 mg/dL — ABNORMAL HIGH (ref 70–99)
Glucose-Capillary: 162 mg/dL — ABNORMAL HIGH (ref 70–99)
Glucose-Capillary: 78 mg/dL (ref 70–99)
Glucose-Capillary: 92 mg/dL (ref 70–99)
Glucose-Capillary: 97 mg/dL (ref 70–99)
Glucose-Capillary: 98 mg/dL (ref 70–99)

## 2013-04-13 LAB — POCT I-STAT, CHEM 8
BUN: 26 mg/dL — ABNORMAL HIGH (ref 6–23)
Calcium, Ion: 1.31 mmol/L — ABNORMAL HIGH (ref 1.13–1.30)
Chloride: 107 mEq/L (ref 96–112)
HCT: 31 % — ABNORMAL LOW (ref 36.0–46.0)
Hemoglobin: 10.5 g/dL — ABNORMAL LOW (ref 12.0–15.0)
Potassium: 4.6 mEq/L (ref 3.5–5.1)
Sodium: 138 mEq/L (ref 135–145)

## 2013-04-13 LAB — TYPE AND SCREEN
ABO/RH(D): B POS
Antibody Screen: NEGATIVE

## 2013-04-13 LAB — CREATININE, SERUM
GFR calc Af Amer: 37 mL/min — ABNORMAL LOW (ref >90)
GFR calc non Af Amer: 32 mL/min — ABNORMAL LOW (ref >90)

## 2013-04-13 LAB — BASIC METABOLIC PANEL
BUN: 22 mg/dL (ref 6–23)
CO2: 19 mEq/L (ref 19–32)
Calcium: 8.1 mg/dL — ABNORMAL LOW (ref 8.4–10.5)
GFR calc Af Amer: 46 mL/min — ABNORMAL LOW (ref >90)
Glucose, Bld: 111 mg/dL — ABNORMAL HIGH (ref 70–99)
Potassium: 4.6 mEq/L (ref 3.5–5.1)
Sodium: 140 mEq/L (ref 135–145)

## 2013-04-13 MED ORDER — CINACALCET HCL 30 MG PO TABS
60.0000 mg | ORAL_TABLET | ORAL | Status: DC
  Administered 2013-04-14 – 2013-04-22 (×5): 60 mg via ORAL
  Filled 2013-04-13 (×5): qty 2

## 2013-04-13 MED ORDER — SODIUM CHLORIDE 0.9 % IV SOLN
INTRAVENOUS | Status: DC
  Administered 2013-04-13: 20 mL/h via INTRAVENOUS
  Administered 2013-04-15: 18:00:00 via INTRAVENOUS

## 2013-04-13 MED ORDER — SODIUM BICARBONATE 8.4 % IV SOLN
INTRAVENOUS | Status: AC
  Administered 2013-04-13: 50 meq
  Filled 2013-04-13: qty 50

## 2013-04-13 MED ORDER — INSULIN DETEMIR 100 UNIT/ML ~~LOC~~ SOLN
20.0000 [IU] | Freq: Two times a day (BID) | SUBCUTANEOUS | Status: DC
  Administered 2013-04-13 – 2013-04-14 (×4): 20 [IU] via SUBCUTANEOUS
  Filled 2013-04-13 (×6): qty 0.2

## 2013-04-13 MED ORDER — FUROSEMIDE 10 MG/ML IJ SOLN
INTRAMUSCULAR | Status: AC
  Filled 2013-04-13: qty 4

## 2013-04-13 MED ORDER — FUROSEMIDE 10 MG/ML IJ SOLN
40.0000 mg | Freq: Three times a day (TID) | INTRAMUSCULAR | Status: AC
  Administered 2013-04-13 – 2013-04-14 (×5): 40 mg via INTRAVENOUS
  Filled 2013-04-13 (×5): qty 4

## 2013-04-13 MED ORDER — WARFARIN - PHYSICIAN DOSING INPATIENT
Freq: Every day | Status: DC
  Administered 2013-04-13 – 2013-04-22 (×8)

## 2013-04-13 MED ORDER — WARFARIN SODIUM 2.5 MG PO TABS
2.5000 mg | ORAL_TABLET | Freq: Every day | ORAL | Status: DC
  Administered 2013-04-13 – 2013-04-18 (×6): 2.5 mg via ORAL
  Filled 2013-04-13 (×7): qty 1

## 2013-04-13 MED ORDER — DOPAMINE-DEXTROSE 3.2-5 MG/ML-% IV SOLN
3.0000 ug/kg/min | INTRAVENOUS | Status: DC
  Administered 2013-04-16: 3 ug/kg/min via INTRAVENOUS
  Filled 2013-04-13 (×2): qty 250

## 2013-04-13 MED ORDER — MORPHINE SULFATE 2 MG/ML IJ SOLN
2.0000 mg | INTRAMUSCULAR | Status: DC | PRN
  Administered 2013-04-13 – 2013-04-15 (×12): 2 mg via INTRAVENOUS
  Filled 2013-04-13 (×12): qty 1

## 2013-04-13 MED ORDER — TACROLIMUS 1 MG PO CAPS
5.0000 mg | ORAL_CAPSULE | Freq: Two times a day (BID) | ORAL | Status: DC
  Administered 2013-04-13 – 2013-04-14 (×4): 5 mg via ORAL
  Filled 2013-04-13 (×6): qty 5

## 2013-04-13 MED ORDER — WARFARIN SODIUM 5 MG PO TABS: 5.0000 mg | ORAL_TABLET | Freq: Every day | ORAL | Status: DC

## 2013-04-13 MED ORDER — AMIODARONE HCL 200 MG PO TABS
200.0000 mg | ORAL_TABLET | Freq: Two times a day (BID) | ORAL | Status: DC
  Administered 2013-04-13 – 2013-04-14 (×3): 200 mg via ORAL
  Filled 2013-04-13 (×6): qty 1

## 2013-04-13 MED ORDER — SODIUM BICARBONATE 8.4 % IV SOLN: 50.0000 meq | Freq: Once | INTRAVENOUS | Status: DC

## 2013-04-13 MED ORDER — INSULIN ASPART 100 UNIT/ML ~~LOC~~ SOLN
0.0000 [IU] | SUBCUTANEOUS | Status: DC
  Administered 2013-04-13: 4 [IU] via SUBCUTANEOUS
  Administered 2013-04-13 – 2013-04-16 (×9): 2 [IU] via SUBCUTANEOUS
  Administered 2013-04-17: 4 [IU] via SUBCUTANEOUS
  Administered 2013-04-17 (×3): 2 [IU] via SUBCUTANEOUS

## 2013-04-13 MED ORDER — DAPSONE 25 MG PO TABS
25.0000 mg | ORAL_TABLET | Freq: Every day | ORAL | Status: DC
  Administered 2013-04-14 – 2013-04-23 (×9): 25 mg via ORAL
  Filled 2013-04-13 (×10): qty 1

## 2013-04-13 MED ORDER — DIPHENHYDRAMINE HCL 25 MG PO CAPS
25.0000 mg | ORAL_CAPSULE | Freq: Four times a day (QID) | ORAL | Status: DC | PRN
  Administered 2013-04-17 – 2013-04-22 (×4): 25 mg via ORAL
  Filled 2013-04-13 (×5): qty 1

## 2013-04-13 MED FILL — Heparin Sodium (Porcine) Inj 1000 Unit/ML: INTRAMUSCULAR | Qty: 20 | Status: AC

## 2013-04-13 MED FILL — Mannitol IV Soln 20%: INTRAVENOUS | Qty: 500 | Status: AC

## 2013-04-13 MED FILL — Sodium Bicarbonate IV Soln 8.4%: INTRAVENOUS | Qty: 50 | Status: AC

## 2013-04-13 MED FILL — Lidocaine HCl IV Inj 20 MG/ML: INTRAVENOUS | Qty: 5 | Status: AC

## 2013-04-13 MED FILL — Electrolyte-R (PH 7.4) Solution: INTRAVENOUS | Qty: 3000 | Status: AC

## 2013-04-13 MED FILL — Sodium Chloride Irrigation Soln 0.9%: Qty: 3000 | Status: AC

## 2013-04-13 NOTE — Clinical Documentation Improvement (Signed)
THIS DOCUMENT IS NOT A PERMANENT PART OF THE MEDICAL RECORD  Please update your documentation with the medical record to reflect your response to this query. If you need help knowing how to do this please call [phone].  04/13/13   Dear Dr. [name],  Progress Noted for 9/26 gives "VDRF - extubate". Please clarify "VDRF" in Notes and DC summary to illustrate this patient's severity of illness and risk of mortality. Thank you.  Possible Clinical Conditions? - Postop Acute Respiratory Failure resolved - Acute Respiratory Failure resolved - Other condition (please specify)  You may use possible, probable, or suspect with inpatient documentation. possible, probable, suspected diagnoses MUST be documented at the time of discharge  Reviewed: change made  Thank You,  Ezekiel Ina RN BSN Clinical Documentation Specialist: [place]

## 2013-04-13 NOTE — Clinical Documentation Improvement (Signed)
THIS DOCUMENT IS NOT A PERMANENT PART OF THE MEDICAL RECORD  Please update your documentation with the medical record to reflect your response to this query. If you need help knowing how to do this please call [phone].  04/13/13   Dear Dr. Roxy Manns,   Progress Note 9/26 states "pulmonary edema per CXR" treated with Lasix 40mg  iv q8h x6 doses in a patient with history of CHF. Please indicate in Notes and DC summary acuity of pulmonary edema and if decompensated CHF is being treated and if so if this would be Systolic, Diastolic or Combined. Thank you.  Reviewed:  no additional documentation provided  Thank You,  Ezekiel Ina RN BSN Clinical Documentation Specialist: [place]   Patient has expected post op volume excess.

## 2013-04-13 NOTE — Progress Notes (Addendum)
[address]       [place] [address]             [phone]      1 Day Post-Op Procedure(s) (LRB): AORTIC VALVE REPLACEMENT (AVR) (N/A) MAZE (N/A) INTRAOPERATIVE TRANSESOPHAGEAL ECHOCARDIOGRAM (N/A)  Subjective:  [name] remains intubated this morning, she is alert and follows commands.  She has discomfort due to breathing tube.    Objective: Vital signs in last 24 hours: Temp:  [97.3 F (36.3 C)-99.1 F (37.3 C)] 99 F (37.2 C) (09/26 0800) Pulse Rate:  [70-91] 79 (09/26 0800) Cardiac Rhythm:  [-] A-V Sequential paced (09/25 2000) Resp:  [12-39] 39 (09/26 0800) BP: (81-123)/(57-78) 106/62 mmHg (09/26 0800) SpO2:  [97 %-100 %] 98 % (09/26 0800) Arterial Line BP: (87-157)/(45-85) 120/60 mmHg (09/26 0800) FiO2 (%):  [40 %-50 %] 40 % (09/26 0645) Weight:  [201 lb 8 oz (91.4 kg)] 201 lb 8 oz (91.4 kg) (09/26 0600)  Hemodynamic parameters for last 24 hours: PAP: (33-53)/(15-29) 51/27 mmHg CO:  [3.8 L/min-5.3 L/min] 4.5 L/min CI:  [2 L/min/m2-2.8 L/min/m2] 2.3 L/min/m2  Intake/Output from previous day: 09/25 0701 - 09/26 0700 In: 6135.8 [I.V.:3048.3; Blood:937.5; IV Piggyback:2150] Out: 3035 [Urine:1285; Blood:1100; Chest Tube:650]  General appearance: alert, cooperative and remains intubated Heart: regular rate and rhythm and Brady under pacer Lungs: clear to auscultation bilaterally Abdomen: soft, non-tender; bowel sounds normal; no masses,  no organomegaly Extremities: edema trace Wound: dressing will remain on sternotomy for 72 hrs  Lab Results:  Recent Labs  04/12/13 1800 04/12/13 1857 04/13/13 0330  WBC 11.2*  --  13.3*  HGB 8.8* 8.5* 9.4*  HCT 26.1* 25.0* 27.7*  PLT 107*  --  123*   BMET:  Recent Labs  04/12/13 0400  04/12/13 1857 04/13/13 0330  NA 142  < > 139 140  K 4.4  < > 4.2 4.6  CL 105  --  109 108  CO2 26  --   --  19  GLUCOSE 140*  < > 99 111*  BUN 23  --  19 22  CREATININE 1.26*  < > 1.30* 1.33*  CALCIUM  8.7  --   --  8.1*  < > = values in this interval not displayed.  PT/INR:  Recent Labs  04/12/13 1400  LABPROT 19.5*  INR 1.70*   ABG    Component Value Date/Time   PHART 7.267* 04/13/2013 0720   HCO3 20.4 04/13/2013 0720   TCO2 22 04/13/2013 0720   ACIDBASEDEF 6.0* 04/13/2013 0720   O2SAT 96.0 04/13/2013 0720   CBG (last 3)   Recent Labs  04/12/13 2301 04/13/13 0001 04/13/13 0109  GLUCAP 154* 133* 98    Assessment/Plan: S/P Procedure(s) (LRB): AORTIC VALVE REPLACEMENT (AVR) (N/A) MAZE (N/A) INTRAOPERATIVE TRANSESOPHAGEAL ECHOCARDIOGRAM (N/A)  1. CV- NSR, brady under pacer- wean Dopamine as tolerated 2. Pulm- remains intubated, likely extubate today continue current respiratory care, atelectasis, pulmonary edema per CXR 3. Renal- creatinine mildly elevated at 1.33, volume overloaded- will need diuresis once appropriate 4. Expected Acute Blood Loss Anemia- mild Hgb 9.4- will follow 5. DM- CBGs controlled- on low dose insulin drip, will start low dose Levemir 6. Dispo- hopefully extubate today,   LOS: 8 days    [name], [name] 04/13/2013   I have seen and examined the patient and agree with the assessment and plan as outlined.  Awake and alert on vent, ready for extubation.  Mobilize.  D/C tubes and lines.  Restart amiodarone and continue AAI pacing for now.  Restart coumadin slowly.  [name],[name] 04/13/2013 9:08 AM

## 2013-04-13 NOTE — Progress Notes (Signed)
Dr Roxy Manns called to receive update on pt status. MD updated pt RASS -1/0, pt with flat affect, slow to respond to verbal questions. Confused at times. Speech weak.  MD updated on 1700 labs from today. Orders received from MD to give 1amp bicard, resume dopamine gtt at 45mcg. MD ok to give PO amiodarone as ordered. Pt weak with IS, pulling ~500. Will continue to monitor. Reather Laurence

## 2013-04-13 NOTE — Procedures (Signed)
Extubation Procedure Note  Patient Details:   Name: [name] DOB: 03-21-1946 MRN: [id number]   Airway Documentation:  Airway 7 mm (Active)  Secured at (cm) 22 cm 04/13/2013  3:54 AM  Measured From Lips 04/13/2013  3:54 AM  Secured Location Right 04/13/2013  3:54 AM  Secured By Rana Snare Tape 04/13/2013  3:54 AM  Cuff Pressure (cm H2O) 28 cm H2O 04/12/2013 11:00 PM  Site Condition Cool;Dry 04/11/2013  2:45 PM    Evaluation  O2 sats: stable throughout Complications: No apparent complications Patient did tolerate procedure well. Bilateral Breath Sounds: Other (Comment) (coarse BS) Suctioning: Airway Yes Extubated Pt to a 3L [place]. PT was able to verbalize her. PT NIF was-20 and VT was 3.5L Carlisia Geno, Leonie Douglas 04/13/2013, 9:19 AM

## 2013-04-13 NOTE — Progress Notes (Addendum)
   Subjective:  Intubated and alert   Objective:  Filed Vitals:   04/13/13 0715 04/13/13 0730 04/13/13 0745 04/13/13 0800  BP:    106/62  Pulse: 70 70 79 79  Temp: 99.1 F (37.3 C) 99 F (37.2 C) 99 F (37.2 C) 99 F (37.2 C)  TempSrc:    Core (Comment)  Resp: 29 22 25  39  Height:      Weight:      SpO2: 99% 99% 98% 98%    Intake/Output from previous day:  Intake/Output Summary (Last 24 hours) at 04/13/13 0844 Last data filed at 04/13/13 0801  Gross per 24 hour  Intake 6350.42 ml  Output   3185 ml  Net 3165.42 ml    Physical Exam: Physical exam: Well-developed well-nourished in no acute distress.  Skin is warm and dry.  HEENT is normal.  Neck - surgical incision site without evidence of infection Chest is clear to auscultation anteriorly Cardiovascular exam RRR; 2/6 systolic murmur, no diastolic murmur Abdominal exam nontender or distended. No masses palpated. Extremities show no edema. neuro grossly intact    Lab Results: Basic Metabolic Panel:  Recent Labs  04/12/13 0400  04/12/13 1800 04/12/13 1857 04/13/13 0330  NA 142  < >  --  139 140  K 4.4  < >  --  4.2 4.6  CL 105  --   --  109 108  CO2 26  --   --   --  19  GLUCOSE 140*  < >  --  99 111*  BUN 23  --   --  19 22  CREATININE 1.26*  --  1.13* 1.30* 1.33*  CALCIUM 8.7  --   --   --  8.1*  MG  --   --  2.9*  --  2.5  < > = values in this interval not displayed. CBC:  Recent Labs  04/12/13 1800 04/12/13 1857 04/13/13 0330  WBC 11.2*  --  13.3*  HGB 8.8* 8.5* 9.4*  HCT 26.1* 25.0* 27.7*  MCV 81.8  --  81.0  PLT 107*  --  123*    Assessment/Plan:  1 severe symptomatic AS- s/p AVR; doing well. Will arrange baseline echo post AVR as outpatient. Wean pressors as tolerated. 2 chronic stage III renal failure-patient is status post renal transplant. Renal function appears to be stable at this point; continue to follow 3 atrial fibrillation-patient in sinus/atrial paced rhythm this AM s/p  Maze.  4 history of coronary artery disease-resume statin when extubated. 5 hypertension-continue metoprolol when BP improves. 6 Abnormal Chest CT -fu chest CT 3 months 7 Postoperative volume excess; diurese. [name] 04/13/2013, 8:44 AM

## 2013-04-14 ENCOUNTER — Inpatient Hospital Stay (HOSPITAL_COMMUNITY): Payer: Medicare Other

## 2013-04-14 LAB — BLOOD GAS, ARTERIAL
Acid-base deficit: 2.7 mmol/L — ABNORMAL HIGH (ref 0.0–2.0)
Bicarbonate: 23.7 mEq/L (ref 20.0–24.0)
Drawn by: 13898
O2 Saturation: 92.1 %
Patient temperature: 98.7
pCO2 arterial: 58 mmHg (ref 35.0–45.0)
pH, Arterial: 7.236 — ABNORMAL LOW (ref 7.350–7.450)
pO2, Arterial: 76.4 mmHg — ABNORMAL LOW (ref 80.0–100.0)

## 2013-04-14 LAB — URINALYSIS, ROUTINE W REFLEX MICROSCOPIC
Bilirubin Urine: NEGATIVE
Glucose, UA: NEGATIVE mg/dL
Hgb urine dipstick: NEGATIVE
Ketones, ur: NEGATIVE mg/dL
Nitrite: NEGATIVE
Protein, ur: NEGATIVE mg/dL
Specific Gravity, Urine: 1.018 (ref 1.005–1.030)
Urobilinogen, UA: 0.2 mg/dL (ref 0.0–1.0)
pH: 5 (ref 5.0–8.0)

## 2013-04-14 LAB — CBC
HCT: 29.3 % — ABNORMAL LOW (ref 36.0–46.0)
Hemoglobin: 9.8 g/dL — ABNORMAL LOW (ref 12.0–15.0)
MCV: 83.7 fL (ref 78.0–100.0)
RBC: 3.5 MIL/uL — ABNORMAL LOW (ref 3.87–5.11)
WBC: 19.3 10*3/uL — ABNORMAL HIGH (ref 4.0–10.5)

## 2013-04-14 LAB — PROCALCITONIN: Procalcitonin: 4.37 ng/mL

## 2013-04-14 LAB — GLUCOSE, CAPILLARY
Glucose-Capillary: 110 mg/dL — ABNORMAL HIGH (ref 70–99)
Glucose-Capillary: 125 mg/dL — ABNORMAL HIGH (ref 70–99)
Glucose-Capillary: 90 mg/dL (ref 70–99)
Glucose-Capillary: 127 mg/dL — ABNORMAL HIGH (ref 70–99)

## 2013-04-14 LAB — BASIC METABOLIC PANEL
BUN: 32 mg/dL — ABNORMAL HIGH (ref 6–23)
CO2: 23 mEq/L (ref 19–32)
Chloride: 107 mEq/L (ref 96–112)
Creatinine, Ser: 1.82 mg/dL — ABNORMAL HIGH (ref 0.50–1.10)
Glucose, Bld: 112 mg/dL — ABNORMAL HIGH (ref 70–99)
Sodium: 144 mEq/L (ref 135–145)

## 2013-04-14 LAB — POCT I-STAT 3, ART BLOOD GAS (G3+)
TCO2: 24 mmol/L (ref 0–100)
pCO2 arterial: 46.9 mmHg — ABNORMAL HIGH (ref 35.0–45.0)
pH, Arterial: 7.29 — ABNORMAL LOW (ref 7.350–7.450)

## 2013-04-14 LAB — CARBOXYHEMOGLOBIN
Carboxyhemoglobin: 1.6 % — ABNORMAL HIGH (ref 0.5–1.5)
Methemoglobin: 1.7 % — ABNORMAL HIGH (ref 0.0–1.5)
O2 Saturation: 92.3 %
Total hemoglobin: 9.6 g/dL — ABNORMAL LOW (ref 12.0–16.0)

## 2013-04-14 LAB — URINE MICROSCOPIC-ADD ON

## 2013-04-14 MED ORDER — VANCOMYCIN HCL 10 G IV SOLR
1250.0000 mg | Freq: Once | INTRAVENOUS | Status: AC
  Administered 2013-04-14: 1250 mg via INTRAVENOUS
  Filled 2013-04-14: qty 1250

## 2013-04-14 MED ORDER — LORAZEPAM 0.5 MG PO TABS
0.5000 mg | ORAL_TABLET | Freq: Four times a day (QID) | ORAL | Status: DC | PRN
  Administered 2013-04-14: 0.5 mg via ORAL
  Filled 2013-04-14: qty 1

## 2013-04-14 MED ORDER — HYDROCODONE-ACETAMINOPHEN 5-325 MG PO TABS: 1.0000 | ORAL_TABLET | ORAL | Status: DC | PRN

## 2013-04-14 MED ORDER — FUROSEMIDE 10 MG/ML IJ SOLN
40.0000 mg | Freq: Once | INTRAMUSCULAR | Status: AC
  Administered 2013-04-14: 40 mg via INTRAVENOUS

## 2013-04-14 MED ORDER — FUROSEMIDE 10 MG/ML IJ SOLN
INTRAMUSCULAR | Status: AC
  Administered 2013-04-14: 40 mg via INTRAVENOUS
  Filled 2013-04-14: qty 4

## 2013-04-14 MED ORDER — ENOXAPARIN SODIUM 40 MG/0.4ML ~~LOC~~ SOLN
40.0000 mg | SUBCUTANEOUS | Status: DC
  Administered 2013-04-14: 40 mg via SUBCUTANEOUS
  Filled 2013-04-14 (×3): qty 0.4

## 2013-04-14 MED ORDER — PIPERACILLIN-TAZOBACTAM 3.375 G IVPB
3.3750 g | Freq: Three times a day (TID) | INTRAVENOUS | Status: DC
  Administered 2013-04-15 – 2013-04-20 (×17): 3.375 g via INTRAVENOUS
  Filled 2013-04-14 (×24): qty 50

## 2013-04-14 MED ORDER — VANCOMYCIN HCL 10 G IV SOLR
1250.0000 mg | INTRAVENOUS | Status: DC
  Filled 2013-04-14: qty 1250

## 2013-04-14 NOTE — Progress Notes (Signed)
All chest tubes d/ced per MD order.Pt tolerated procedure well.

## 2013-04-14 NOTE — Progress Notes (Signed)
2 Days Post-Op Procedure(s) (LRB): AORTIC VALVE REPLACEMENT (AVR) (N/A) MAZE (N/A) INTRAOPERATIVE TRANSESOPHAGEAL ECHOCARDIOGRAM (N/A) Subjective: Needed BiPAP last night for altered mental status and CO2 retention Looks better this morning, responsive and comfortable Pain issues improved after removing chest tubes Blood pressure and urine output have been acceptable on renal dose dopamine She needs to be mobilized out of bed, physical therapy has been requested Objective: Vital signs in last 24 hours: Temp:  [97.8 F (36.6 C)-98.6 F (37 C)] 98.3 F (36.8 C) (09/27 0813) Pulse Rate:  [76-137] 83 (09/27 0800) Cardiac Rhythm:  [-] Normal sinus rhythm (09/27 0800) Resp:  [15-41] 19 (09/27 0800) BP: (83-177)/(32-147) 158/103 mmHg (09/27 0800) SpO2:  [91 %-100 %] 98 % (09/27 0800) Arterial Line BP: (107-132)/(47-65) 119/65 mmHg (09/26 1400) FiO2 (%):  [40 %] 40 % (09/27 0800) Weight:  [193 lb 5.5 oz (87.7 kg)] 193 lb 5.5 oz (87.7 kg) (09/27 0500)  Hemodynamic parameters for last 24 hours: PAP: (44-55)/(14-27) 45/17 mmHg CVP:  [17 mmHg-23 mmHg] 23 mmHg  Intake/Output from previous day: 09/26 0701 - 09/27 0700 In: 693.4 [I.V.:518.4; IV Piggyback:100] Out: 1560 [Urine:1090; Chest Tube:470] Intake/Output this shift: Total I/O In: -  Out: 65 [Urine:65]  Comfortable in bed on BiPAP Lungs clear Chest x-ray satisfactory Extremities warm  Lab Results:  Recent Labs  04/13/13 1600 04/13/13 1643 04/14/13 0400  WBC 19.0*  --  19.3*  HGB 9.5* 10.5* 9.8*  HCT 28.7* 31.0* 29.3*  PLT 144*  --  151   BMET:  Recent Labs  04/13/13 0330  04/13/13 1643 04/14/13 0400  NA 140  --  138 144  K 4.6  --  4.6 5.1  CL 108  --  107 107  CO2 19  --   --  23  GLUCOSE 111*  --  157* 112*  BUN 22  --  26* 32*  CREATININE 1.33*  < > 1.60* 1.82*  CALCIUM 8.1*  --   --  9.0  < > = values in this interval not displayed.  PT/INR:  Recent Labs  04/14/13 0400  LABPROT 17.2*  INR 1.44    ABG    Component Value Date/Time   PHART 7.290* 04/14/2013 0805   HCO3 22.6 04/14/2013 0805   TCO2 24 04/14/2013 0805   ACIDBASEDEF 4.0* 04/14/2013 0805   O2SAT 95.0 04/14/2013 0805   CBG (last 3)   Recent Labs  04/13/13 2357 04/14/13 0353 04/14/13 0811  GLUCAP 115* 110* 125*    Assessment/Plan: S/P Procedure(s) (LRB): AORTIC VALVE REPLACEMENT (AVR) (N/A) MAZE (N/A) INTRAOPERATIVE TRANSESOPHAGEAL ECHOCARDIOGRAM (N/A)  Urine output adequate but creatinine slightly increased over baseline--we'll continue renal dopamine and other day and Lasix on a scheduled dose White count elevated but no fever or signs of sepsis we'll follow Coumadin just started, cover with low-dose Lovenox  LOS: 9 days    VAN TRIGT III,PETER 04/14/2013

## 2013-04-14 NOTE — Progress Notes (Signed)
Patient examined and record reviewed.Hemodynamics stable,labs satisfactory.Patient had stable day.Continue current care.  Patient had a better day with decrease pain after removal of chest tubes Tolerated off BiPAP out of bed to chair Hemodynamic stable VAN TRIGT III,PETER 04/14/2013

## 2013-04-14 NOTE — Progress Notes (Signed)
Respiratory therapy note- placed to bipap/NIV for increased edema, Lasix  given by RN and family at the bedside.

## 2013-04-14 NOTE — Evaluation (Signed)
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 Pt with severe aortic stenosis now s/p AVR on 9/24.    Clinical Impression  Pt lethargic and with limited participation.  At this point feel CIR safest D/C plan to maximize pt's independence.      PT Assessment  Patient needs continued PT services    Follow Up Recommendations  CIR    Does the patient have the potential to tolerate intense rehabilitation      Barriers to Discharge        Equipment Recommendations  Rolling walker with 5" wheels    Recommendations for Other Services     Frequency Min 3X/week    Precautions / Restrictions Precautions Precautions: Sternal;Fall Precaution Comments: Will need reviewing sternal precautions 2/2 poor attention.   Restrictions Weight Bearing Restrictions: No   Pertinent Vitals/Pain Did not indicate pain, but grimaced with mobility.        Mobility  Bed Mobility Bed Mobility: Not assessed Transfers Transfers: Sit to Stand;Stand to Sit Sit to Stand: 1: +2 Total assist;From chair/3-in-1 Sit to Stand: Patient Percentage: 40% Stand to Sit: 1: +2 Total assist;To chair/3-in-1 Stand to Sit: Patient Percentage: 40% Details for Transfer Assistance: cues for safe technique and sternal precautions.  Had pt use pillow held at chest to prevent pt using UEs.  pt with difficulty finding balance and leans posteriorly initially and then anteriorly, needing A to prevent falls.    Ambulation/Gait Ambulation/Gait Assistance: Not tested (comment) Stairs: No Wheelchair Mobility Wheelchair Mobility: No    Exercises     PT Diagnosis: Generalized weakness;Difficulty walking  PT Problem List: Decreased strength;Decreased activity tolerance;Decreased balance;Decreased mobility;Decreased knowledge of use of DME;Decreased knowledge of precautions;Cardiopulmonary status limiting activity;Pain PT Treatment Interventions: DME instruction;Gait training;Stair training;Functional mobility training;Therapeutic activities;Therapeutic exercise;Balance training;Patient/family education     PT Goals(Current goals can be found in the care plan section) Acute Rehab PT Goals Patient Stated Goal: None stated PT Goal Formulation: Patient unable to participate in goal setting Time For Goal Achievement: 04/28/13 Potential to Achieve Goals: Good  Visit Information  Last PT Received On: 04/14/13 Assistance Needed: +2 History of Present Illness: [name] is a 67 y/o F with history of CAD s/p MI/RCA stent 2005, aortic stenosis, 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 Pt with severe aortic stenosis now s/p AVR on 9/24.         Prior Ramos expects to be discharged to:: Private residence Living Arrangements: Spouse/significant other Available Help at Discharge: Family;Available 24 hours/day Type of Home: House Home Access: Stairs to enter [employer] of Steps: 1 Home Layout: Two level;Bed/bath upstairs Alternate Level Stairs-Number of Steps: flight Alternate Level Stairs-Rails: Right Home Equipment: Cane - single point Prior Function Level of Independence: Independent with assistive device(s) Communication Communication: No difficulties     Cognition  Cognition Arousal/Alertness: Lethargic Behavior During Therapy: Flat affect Overall Cognitive Status: Difficult to assess Difficult to assess due to: Level of arousal    Extremity/Trunk Assessment Upper Extremity Assessment Upper Extremity Assessment: Defer to OT evaluation Lower Extremity Assessment Lower Extremity Assessment: Generalized weakness   Balance Balance Balance Assessed: Yes Static Standing Balance Static Standing - Balance Support: Bilateral upper extremity supported Static Standing - Level of Assistance: 1: +2 Total assist Static Standing - Comment/# of Minutes: Stood x2 for ~1-23mins each before pt fatigued and sat down.    End of Session PT - End of Session Equipment Utilized During Treatment: Oxygen Activity Tolerance: Patient limited by fatigue;Patient limited by lethargy Patient left: in chair;with call bell/phone within reach Nurse Communication: Mobility status  GP     Catarina Hartshorn, [place] 04/14/2013, 12:30 PM

## 2013-04-14 NOTE — Progress Notes (Signed)
ANTIBIOTIC CONSULT NOTE - INITIAL  Pharmacy Consult for vancomycin Indication: rule out sepsis  Allergies  Allergen Reactions  . Ibuprofen Nausea And Vomiting  . Sulfamethoxazole W-Trimethoprim Swelling  . Sulfonamide Derivatives Swelling  . Tape Rash  . Tramadol Nausea And Vomiting  . Bactrim Swelling  . Red Dye Itching and Rash    Patient Measurements: Height: 5\' 2"  (157.5 cm) Weight: 193 lb 5.5 oz (87.7 kg) IBW/kg (Calculated) : 50.1  Vital Signs: Temp: 98.2 F (36.8 C) (09/27 2000) Temp src: Oral (09/27 2000) BP: 113/55 mmHg (09/27 2100) Pulse Rate: 85 (09/27 2100) Intake/Output from previous day: 09/26 0701 - 09/27 0700 In: 693.4 [I.V.:518.4; IV Piggyback:100] Out: 1560 [Urine:1090; Chest Tube:470] Intake/Output from this shift: Total I/O In: 60.2 [I.V.:10.2; IV Piggyback:50] Out: 60 [Urine:60]  Labs:  Recent Labs  04/13/13 0330 04/13/13 1600 04/13/13 1643 04/14/13 0400  WBC 13.3* 19.0*  --  19.3*  HGB 9.4* 9.5* 10.5* 9.8*  PLT 123* 144*  --  151  CREATININE 1.33* 1.61* 1.60* 1.82*   Estimated Creatinine Clearance: 30 ml/min (by C-G formula based on Cr of 1.82).   Microbiology: Recent Results (from the past 720 hour(s))  URINE CULTURE     Status: None   Collection Time    04/07/13  5:53 AM      Result Value Range Status   Specimen Description URINE, CLEAN CATCH   Final   Special Requests NONE   Final   Culture  Setup Time     Final   Value: 04/07/2013 15:27     Performed at [facility]     Final   Value: >=100,000 COLONIES/ML     Performed at [facility]   Culture     Final   Value: Multiple bacterial morphotypes present, none predominant. Suggest appropriate recollection if clinically indicated.     Performed at [facility]   Report Status 04/08/2013 FINAL   Final  SURGICAL PCR SCREEN     Status: None   Collection Time    04/07/13  4:45 PM      Result Value Range Status   MRSA, PCR NEGATIVE   NEGATIVE Final   Staphylococcus aureus NEGATIVE  NEGATIVE Final   Comment:            The Xpert SA Assay (FDA     approved for NASAL specimens     in patients over 96 years of age),     is one component of     a comprehensive surveillance     program.  Test performance has     been validated by [employer] for patients greater     than or equal to 52 year old.     It is not intended     to diagnose infection nor to     guide or monitor treatment.    Medical History: Past Medical History  Diagnosis Date  . Coronary artery disease 05/2002    a. Ant MI 2003 s/p PTCA/stent to RCA.   Marland Kitchen Hypertension   . Hyperlipidemia   . CHF (congestive heart failure)   . Pericardial effusion     a. Small by echo 11/2011.  Marland Kitchen GERD (gastroesophageal reflux disease)   . 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 Unspecified hearing loss   . Anemia, iron deficiency     of chronic disease  . Helicobacter pylori (H. pylori) infection  hx of  . Esophagitis, reflux   . Gout   . Cholelithiasis   . Hx of colonic polyps   . Diverticulosis of colon   . 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  Marland Kitchen Chronic cough onset 03/2010    Dr Melvyn Novas  . Carotid artery disease     a. Carotid Dopplers performed in August 2013 showed 40-59% left stenosis and 0-39% right; f/u recommended in 2 years.   . Aortic stenosis     a. Severe AS by echo 11/2012.  . Asystole     a. During ENT surgery 2005: developed marked asystole requiring CPR, felt due to vagal reaction (cath nonobst dz).  . Myocardial infarction 2003  . Cerebrovascular accident 2009    a. LMCA infarct felt embolic 123XX123, maintained on chronic coumadin.; denies residual on 04/05/2013  . Sleep apnea     Pt says testing was positive, intolerant of CPAP.  Marland Kitchen Type II diabetes mellitus    . ESRD (end stage renal disease)     a. Mass on L kidney per pt s/p nephrectomy - pt states not cancer - [facility] notes indicate ESRD due to HTN/DM - was previously on HD. b. Kidney transplant 02/2011.  . S/P kidney transplant 03/16/2011  . Chronic Persistent Atrial Fibrillation 12/31/2008    Qualifier: Diagnosis of  By: [name]    . Nodule of right lung 04/07/2013    Ground glass opacity right lung  . S/P aortic valve replacement with bioprosthetic valve and maze procedure 04/12/2013    67mm [facility] Ease bovine pericardial tissue valve   . S/P Maze operation for atrial fibrillation 04/12/2013    Complete bilateral atrial lesion set using bipolar radiofrequency and cryothermy ablation with clipping of LA appendage    Medications:  Prescriptions prior to admission  Medication Sig Dispense Refill  . cinacalcet (SENSIPAR) 60 MG tablet Take 60 mg by mouth every other day.      . dapsone 25 MG tablet Take 25 mg by mouth daily.       . diphenhydrAMINE (BENADRYL) 50 MG capsule Take 1 capsule (50 mg total) by mouth every 6 (six) hours as needed for itching.  90 capsule  3  . enoxaparin (LOVENOX) 80 MG/0.8ML injection Inject 0.8 mLs (80 mg total) into the skin every 12 (twelve) hours.  20 Syringe  0  . Flurandrenolide 4 MCG/SQCM TAPE Apply topically daily.      . furosemide (LASIX) 20 MG tablet Take 1 tablet by mouth daily.      Marland Kitchen glipiZIDE (GLUCOTROL XL) 10 MG 24 hr tablet Take 10 mg by mouth 2 (two) times daily.      . Magnesium 400 MG CAPS Take 800 mg by mouth daily.      . metoprolol tartrate (LOPRESSOR) 25 MG tablet Take 25 mg by mouth 2 (two) times daily.      . mycophenolate (MYFORTIC) 180 MG EC tablet Take 360 mg by mouth 2 (two) times daily.       Marland Kitchen nystatin (MYCOSTATIN) 100000 UNIT/ML suspension Take 500,000 Units by mouth daily. Swish and Spit 5 ml as directed      . pravastatin (PRAVACHOL) 40 MG tablet Take 40 mg by mouth daily. Take one tablet Mon, Wed, and Friday      .  tacrolimus (PROGRAF) 1 MG capsule Take 5 mg by  mouth 2 (two) times daily. Take 5 capsules in the am, and 5 capsules in the pm.      . triamcinolone cream (KENALOG) 0.1 % Apply 1 application topically 2 (two) times daily.       Marland Kitchen warfarin (COUMADIN) 5 MG tablet Take 5-7.5 mg by mouth daily. Take 5 mg on Monday, Wednesday, Thursday, Friday, Saturday, and Sunday.  Take 7.5 mg on Tuesday.       Scheduled:  . amiodarone  200 mg Oral BID PC  . bisacodyl  10 mg Oral Daily   Or  . bisacodyl  10 mg Rectal Daily  . cinacalcet  60 mg Oral Q48H  . dapsone  25 mg Oral Daily  . docusate sodium  200 mg Oral Daily  . enoxaparin (LOVENOX) injection  40 mg Subcutaneous Q24H  . insulin aspart  0-24 Units Subcutaneous Q4H  . insulin detemir  20 Units Subcutaneous BID  . pantoprazole  40 mg Oral Daily  . piperacillin-tazobactam (ZOSYN)  IV  3.375 g Intravenous Q8H  . sodium bicarbonate  50 mEq Intravenous Once  . sodium chloride  3 mL Intravenous Q12H  . tacrolimus  5 mg Oral BID  . warfarin  2.5 mg Oral q1800  . Warfarin - Physician Dosing Inpatient   Does not apply q1800   Infusions:  . sodium chloride 250 mL (04/13/13 0337)  . sodium chloride Stopped (04/14/13 1800)  . DOPamine 3 mcg/kg/min (04/14/13 2100)    Assessment: 67yo female appears septic w/ SOB POD#2 s/p AVR, to begin IV ABX; pt w/ renal transplant, current SCr unstable.  Goal of Therapy:  Vancomycin trough level 15-20 mcg/ml  Plan:  Will begin vancomycin 1250mg  IV Q24H and monitor CBC, CrCl, Cx, levels prn.  Wynona Neat, PharmD, BCPS  04/14/2013,10:18 PM

## 2013-04-14 NOTE — Progress Notes (Signed)
04/14/13 1815  Vitals  BP 126/70 mmHg  MAP (mmHg) 82  Pulse Rate 87  ECG Heart Rate 88  Resp ! 29  Oxygen Therapy  SpO2 93 %  Invasive Hemodynamic Monitoring  CVP (mmHg) 25 mmHg  Patient feeling more short of breath. Breath sounds coarse crackles approximately 3/4 up bilaterally. Pericardial friction rub present. +JVD. Urine output approximately 170 ml since furosemide given @ 1500. RR 30-50. Sitting straight up in bed. O2 3L  With sats 94-95%. Complaining of midsternal chest pain and restless. Dr. Prescott Gum notified at 1825. Orders for 70% Bipap, furosemide 40 mg IV and bedrest obtained. Furosemide given and RT notified to place patient on BIPAP at 70%. At 1900, RR down to 40, CVP down to 23. Patient resting more comfortably and pain has decreased.

## 2013-04-15 ENCOUNTER — Encounter (HOSPITAL_COMMUNITY): Payer: Self-pay | Admitting: Radiology

## 2013-04-15 ENCOUNTER — Inpatient Hospital Stay (HOSPITAL_COMMUNITY): Payer: Medicare Other

## 2013-04-15 DIAGNOSIS — Z952 Presence of prosthetic heart valve: Secondary | ICD-10-CM

## 2013-04-15 DIAGNOSIS — I369 Nonrheumatic tricuspid valve disorder, unspecified: Secondary | ICD-10-CM

## 2013-04-15 LAB — BLOOD GAS, ARTERIAL
Acid-base deficit: 2.5 mmol/L — ABNORMAL HIGH (ref 0.0–2.0)
Acid-base deficit: 2.6 mmol/L — ABNORMAL HIGH (ref 0.0–2.0)
Bicarbonate: 22.9 mEq/L (ref 20.0–24.0)
Bicarbonate: 22.9 mEq/L (ref 20.0–24.0)
Delivery systems: POSITIVE
Delivery systems: POSITIVE
Drawn by: 13898
Drawn by: 13898
Expiratory PAP: 5
Expiratory PAP: 5
FIO2: 0.4 %
FIO2: 0.6 %
Inspiratory PAP: 12
Inspiratory PAP: 12
O2 Saturation: 93.3 %
O2 Saturation: 94.4 %
Patient temperature: 98.6
Patient temperature: 98.6
TCO2: 24.3 mmol/L (ref 0–100)
TCO2: 24.4 mmol/L (ref 0–100)
pCO2 arterial: 47.4 mmHg — ABNORMAL HIGH (ref 35.0–45.0)
pCO2 arterial: 48.5 mmHg — ABNORMAL HIGH (ref 35.0–45.0)
pH, Arterial: 7.296 — ABNORMAL LOW (ref 7.350–7.450)
pH, Arterial: 7.304 — ABNORMAL LOW (ref 7.350–7.450)
pO2, Arterial: 76.4 mmHg — ABNORMAL LOW (ref 80.0–100.0)
pO2, Arterial: 81.4 mmHg (ref 80.0–100.0)

## 2013-04-15 LAB — BASIC METABOLIC PANEL
BUN: 44 mg/dL — ABNORMAL HIGH (ref 6–23)
BUN: 49 mg/dL — ABNORMAL HIGH (ref 6–23)
CO2: 23 mEq/L (ref 19–32)
CO2: 23 mEq/L (ref 19–32)
Calcium: 9.2 mg/dL (ref 8.4–10.5)
Calcium: 9.2 mg/dL (ref 8.4–10.5)
Chloride: 101 mEq/L (ref 96–112)
Chloride: 104 mEq/L (ref 96–112)
Creatinine, Ser: 2.17 mg/dL — ABNORMAL HIGH (ref 0.50–1.10)
Creatinine, Ser: 2.42 mg/dL — ABNORMAL HIGH (ref 0.50–1.10)
GFR calc Af Amer: 26 mL/min — ABNORMAL LOW (ref >90)
GFR calc Af Amer: 22 mL/min — ABNORMAL LOW (ref >90)
GFR calc non Af Amer: 22 mL/min — ABNORMAL LOW (ref >90)
GFR calc non Af Amer: 19 mL/min — ABNORMAL LOW (ref >90)
Glucose, Bld: 82 mg/dL (ref 70–99)
Glucose, Bld: 96 mg/dL (ref 70–99)
Potassium: 5.1 mEq/L (ref 3.5–5.1)
Potassium: 4.4 mEq/L (ref 3.5–5.1)
Sodium: 137 mEq/L (ref 135–145)
Sodium: 139 mEq/L (ref 135–145)

## 2013-04-15 LAB — DIFFERENTIAL
Basophils Absolute: 0 10*3/uL (ref 0.0–0.1)
Basophils Relative: 0 % (ref 0–1)
Eosinophils Absolute: 0 10*3/uL (ref 0.0–0.7)
Eosinophils Relative: 0 % (ref 0–5)
Lymphocytes Relative: 5 % — ABNORMAL LOW (ref 12–46)
Lymphs Abs: 0.9 10*3/uL (ref 0.7–4.0)
Monocytes Absolute: 2.9 10*3/uL — ABNORMAL HIGH (ref 0.1–1.0)
Monocytes Relative: 15 % — ABNORMAL HIGH (ref 3–12)
Neutro Abs: 15.6 10*3/uL — ABNORMAL HIGH (ref 1.7–7.7)
Neutrophils Relative %: 80 % — ABNORMAL HIGH (ref 43–77)

## 2013-04-15 LAB — CBC
HCT: 27.5 % — ABNORMAL LOW (ref 36.0–46.0)
Hemoglobin: 9.2 g/dL — ABNORMAL LOW (ref 12.0–15.0)
MCH: 27.9 pg (ref 26.0–34.0)
MCHC: 33.5 g/dL (ref 30.0–36.0)
MCV: 83.3 fL (ref 78.0–100.0)
Platelets: 305 10*3/uL (ref 150–400)
RBC: 3.3 MIL/uL — ABNORMAL LOW (ref 3.87–5.11)
RDW: 16.2 % — ABNORMAL HIGH (ref 11.5–15.5)
WBC: 19.6 10*3/uL — ABNORMAL HIGH (ref 4.0–10.5)

## 2013-04-15 LAB — HEPATIC FUNCTION PANEL
ALT: 97 U/L — ABNORMAL HIGH (ref 0–35)
AST: 72 U/L — ABNORMAL HIGH (ref 0–37)
Albumin: 2.8 g/dL — ABNORMAL LOW (ref 3.5–5.2)
Alkaline Phosphatase: 111 U/L (ref 39–117)
Bilirubin, Direct: <0.1 mg/dL (ref 0.0–0.3)
Total Bilirubin: 0.3 mg/dL (ref 0.3–1.2)
Total Protein: 6.1 g/dL (ref 6.0–8.3)

## 2013-04-15 LAB — POCT I-STAT 3, ART BLOOD GAS (G3+)
Acid-base deficit: 2 mmol/L (ref 0.0–2.0)
Bicarbonate: 24.6 mEq/L — ABNORMAL HIGH (ref 20.0–24.0)
O2 Saturation: 100 %
TCO2: 26 mmol/L (ref 0–100)
pH, Arterial: 7.297 — ABNORMAL LOW (ref 7.350–7.450)

## 2013-04-15 LAB — GLUCOSE, CAPILLARY
Glucose-Capillary: 76 mg/dL (ref 70–99)
Glucose-Capillary: 51 mg/dL — ABNORMAL LOW (ref 70–99)
Glucose-Capillary: 53 mg/dL — ABNORMAL LOW (ref 70–99)
Glucose-Capillary: 73 mg/dL (ref 70–99)

## 2013-04-15 LAB — CARBOXYHEMOGLOBIN
Carboxyhemoglobin: 1.4 % (ref 0.5–1.5)
Methemoglobin: 1.7 % — ABNORMAL HIGH (ref 0.0–1.5)
O2 Saturation: 88.4 %
Total hemoglobin: 9.4 g/dL — ABNORMAL LOW (ref 12.0–16.0)

## 2013-04-15 LAB — PROTIME-INR
INR: 1.51 — ABNORMAL HIGH (ref 0.00–1.49)
Prothrombin Time: 17.8 seconds — ABNORMAL HIGH (ref 11.6–15.2)

## 2013-04-15 LAB — VANCOMYCIN, RANDOM: Vancomycin Rm: 21.7 ug/mL

## 2013-04-15 MED ORDER — ENOXAPARIN SODIUM 30 MG/0.3ML ~~LOC~~ SOLN
30.0000 mg | SUBCUTANEOUS | Status: DC
  Filled 2013-04-15: qty 0.3

## 2013-04-15 MED ORDER — AMIODARONE HCL IN DEXTROSE 360-4.14 MG/200ML-% IV SOLN
30.0000 mg/h | INTRAVENOUS | Status: AC
  Administered 2013-04-15 – 2013-04-17 (×6): 30 mg/h via INTRAVENOUS
  Filled 2013-04-15 (×12): qty 200

## 2013-04-15 MED ORDER — FUROSEMIDE 10 MG/ML IJ SOLN
80.0000 mg | Freq: Three times a day (TID) | INTRAMUSCULAR | Status: DC
  Administered 2013-04-15 – 2013-04-17 (×8): 80 mg via INTRAVENOUS
  Filled 2013-04-15 (×11): qty 8

## 2013-04-15 MED ORDER — HALOPERIDOL LACTATE 5 MG/ML IJ SOLN
4.0000 mg | INTRAMUSCULAR | Status: AC
  Administered 2013-04-15: 4 mg via INTRAVENOUS

## 2013-04-15 MED ORDER — DEXTROSE 50 % IV SOLN
INTRAVENOUS | Status: AC
  Administered 2013-04-15: 25 mL
  Filled 2013-04-15: qty 50

## 2013-04-15 MED ORDER — BIOTENE DRY MOUTH MT LIQD
15.0000 mL | Freq: Two times a day (BID) | OROMUCOSAL | Status: DC
  Administered 2013-04-15 – 2013-04-17 (×6): 15 mL via OROMUCOSAL

## 2013-04-15 MED ORDER — FENTANYL CITRATE 0.05 MG/ML IJ SOLN
12.5000 ug | INTRAMUSCULAR | Status: DC | PRN
  Administered 2013-04-15 – 2013-04-22 (×8): 12.5 ug via INTRAVENOUS
  Filled 2013-04-15 (×8): qty 2

## 2013-04-15 MED ORDER — AMIODARONE HCL IN DEXTROSE 360-4.14 MG/200ML-% IV SOLN: 30.0000 mg/h | INTRAVENOUS | Status: DC

## 2013-04-15 MED ORDER — MYCOPHENOLATE MOFETIL 200 MG/ML PO SUSR
500.0000 mg | Freq: Two times a day (BID) | ORAL | Status: DC
  Administered 2013-04-15 – 2013-04-19 (×9): 500 mg via ORAL
  Filled 2013-04-15 (×12): qty 2.5

## 2013-04-15 MED ORDER — BACITRACIN-NEOMYCIN-POLYMYXIN OINTMENT TUBE
TOPICAL_OINTMENT | Freq: Two times a day (BID) | CUTANEOUS | Status: AC
  Administered 2013-04-15: 18:00:00 via TOPICAL
  Filled 2013-04-15: qty 15

## 2013-04-15 MED ORDER — HALOPERIDOL LACTATE 5 MG/ML IJ SOLN
INTRAMUSCULAR | Status: AC
  Filled 2013-04-15: qty 1

## 2013-04-15 MED ORDER — DEXTROSE 50 % IV SOLN: 25.0000 mL | Freq: Once | INTRAVENOUS | Status: DC | PRN

## 2013-04-15 MED ORDER — PANTOPRAZOLE SODIUM 40 MG IV SOLR
40.0000 mg | INTRAVENOUS | Status: DC
  Administered 2013-04-15 – 2013-04-19 (×5): 40 mg via INTRAVENOUS
  Filled 2013-04-15 (×8): qty 40

## 2013-04-15 MED ORDER — TACROLIMUS 1 MG/ML ORAL SUSPENSION
5.0000 mg | Freq: Two times a day (BID) | ORAL | Status: DC
  Administered 2013-04-15 – 2013-04-19 (×8): 5 mg via ORAL
  Filled 2013-04-15 (×10): qty 5

## 2013-04-15 MED ORDER — HALOPERIDOL LACTATE 5 MG/ML IJ SOLN
2.0000 mg | INTRAMUSCULAR | Status: DC | PRN
  Administered 2013-04-15 – 2013-04-17 (×3): 2 mg via INTRAVENOUS
  Filled 2013-04-15 (×3): qty 1

## 2013-04-15 MED ORDER — DEXTROSE-NACL 5-0.9 % IV SOLN
INTRAVENOUS | Status: DC
  Administered 2013-04-15 – 2013-04-16 (×2): via INTRAVENOUS

## 2013-04-15 MED ORDER — DEXTROSE 50 % IV SOLN
25.0000 mL | Freq: Once | INTRAVENOUS | Status: AC | PRN
  Filled 2013-04-15: qty 50

## 2013-04-15 MED ORDER — FUROSEMIDE 10 MG/ML IJ SOLN
80.0000 mg | Freq: Two times a day (BID) | INTRAMUSCULAR | Status: DC
  Administered 2013-04-14: 40 mg via INTRAVENOUS
  Administered 2013-04-15: 80 mg via INTRAVENOUS
  Filled 2013-04-15: qty 8

## 2013-04-15 NOTE — Progress Notes (Signed)
Respiratory therapy note-Bipap was removed in attempt to place NG so that patient could receive contrast to go to CT. during attempt patient had a bloody nose, Dr. Darcey Nora at bedside and sputum obtained at this time. Patient went to CT without any distress and now remains off the Bipap.

## 2013-04-15 NOTE — Progress Notes (Signed)
Pt continues to thrash around in bed, pushing with her arms after multiple attempts to reposition. Pt complains of pain, pain medication was give yet there was only minimal relief. Pt extremely agitated and attempts to pull of O2 mask. Pt has been educated and advised as to why it is important not to push on her elbows or hands and the repercussions of doing so. Haldol was given to help relieve some agitation, pt did not seem to have much relief from that. Will continue to monitor pt and treat as necessary. Pt safety maintained, restraints are still needed to help maintain safety. Pt currently has VS WNL.

## 2013-04-15 NOTE — Progress Notes (Signed)
Spoke with MD about patient still being restless despite giving pain/sedation meds.  Requested ABG orders, orders given will obtain ABG, MD said to call back if ABG abnormal.  Patient is EXTREMELY restless and anxious, is not tolerating BIPAP.  Oxygen sats are above 95%. BP are normal, still in NSR.  Will monitor patient.

## 2013-04-15 NOTE — Consult Note (Signed)
[name] is an 67 y.o. female referred by Dr. Prescott Gum   Chief Complaint: s/p aortic valve replacement and maze procedure now with worsening renal function  HPI: Patient is a 67 yo female with a history of ESRD s/p transplant in 2012 who is 3 days s/p aortic valve replacement and maze procedure who has has worsening Cr since the day of the surgery. Patient was extubated following the surgery and has had progressive need for BiPAP since then with increasing pCO2 to 48.4 this morning. She has been noted to have increasing confusion as well over this time frame.  The patient was noted on admission to have a Cr of 1.4. Nephrology was consulted at that time and followed the patient as she was to have cardiac catheterization on admission. Her Cr peaked at 1.52 and trended to 1.13 prior to her surgery and nephrology signed off at that time as renal function was normalized. Subsequently since surgery her Cr has progressively risen to 2.17 today. Following surgery the patient had a prolonged episode of hypotension with BPs to the 80's/60's. And more recently over the last day she has had low BPs to the 110's/50's. She has been receiving IV lasix to help with diuresis and today has put out about 20 mL/hr. Over the past 2 days she has put out ~1 L and ~800 mL each day. She is on cellcept and prograf for antirejection of her transplant at home. It appears here she is on prograf. She had a level in early September and it was 5.4.  Past Medical History  Diagnosis Date  . Coronary artery disease 05/2002    a. Ant MI 2003 s/p PTCA/stent to RCA.   Marland Kitchen Hypertension   . Hyperlipidemia   . CHF (congestive heart failure)   . Pericardial effusion     a. Small by echo 11/2011.  Marland Kitchen GERD (gastroesophageal reflux disease)   . 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 Unspecified hearing loss   . Anemia, iron deficiency     of chronic disease  . Helicobacter pylori (H. pylori) infection     hx of  .  Esophagitis, reflux   . Gout   . Cholelithiasis   . Hx of colonic polyps   . Diverticulosis of colon   . 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  Marland Kitchen Chronic cough onset 03/2010    Dr Melvyn Novas  . Carotid artery disease     a. Carotid Dopplers performed in August 2013 showed 40-59% left stenosis and 0-39% right; f/u recommended in 2 years.   . Aortic stenosis     a. Severe AS by echo 11/2012.  . Asystole     a. During ENT surgery 2005: developed marked asystole requiring CPR, felt due to vagal reaction (cath nonobst dz).  . Myocardial infarction 2003  . Cerebrovascular accident 2009    a. LMCA infarct felt embolic [id number], maintained on chronic coumadin.; denies residual on 04/05/2013  . Sleep apnea     Pt says testing was positive, intolerant of CPAP.  Marland Kitchen Type II diabetes mellitus   . ESRD (end stage renal disease)     a. Mass on L kidney per pt s/p nephrectomy - pt states not cancer - [facility] notes indicate ESRD due to HTN/DM - was previously on HD. b. Kidney transplant 02/2011.  Marland Kitchen  S/P kidney transplant 03/16/2011  . Chronic Persistent Atrial Fibrillation 12/31/2008    Qualifier: Diagnosis of  By: [name]    . Nodule of right lung 04/07/2013    Ground glass opacity right lung  . 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 S/P Maze operation for atrial fibrillation 04/12/2013    Complete bilateral atrial lesion set using bipolar radiofrequency and cryothermy ablation with clipping of LA appendage    Past Surgical History  Procedure Laterality Date  . Cholecystectomy  2009  . Arteriovenous graft placement Left   . Av fistula placement Right   . Nasal reconstruction with septal repair      "took it out" (04/05/2013)  . Tubal ligation     . Nephrectomy Left 2010    no CA on bx  . Kidney transplant  03/16/11  . Cardioversion  05/29/2012    Procedure: CARDIOVERSION;  Surgeon: Lelon Perla, MD;  Location: [facility] ENDOSCOPY;  Service: Cardiovascular;  Laterality: N/A;  . Coronary angioplasty with stent placement Right     coronary artery  . Tonsillectomy    . Total abdominal hysterectomy    . Arteriovenous graft placement Left     "I've had 2 on my left; had one removed" (04/05/2013)   . Av fistula repair Right     "took it out" ((/18/2014)  . Insertion of dialysis catheter Bilateral     "over the years; took them both out" (04/05/2013)  . Intraoperative transesophageal echocardiogram N/A 04/11/2013    Procedure: INTRAOPERATIVE TRANSESOPHAGEAL ECHOCARDIOGRAM;  Surgeon: Rexene Alberts, MD;  Location: [place];  Service: Open Heart Surgery;  Laterality: N/A;  . Artery exploration Right 04/11/2013    Procedure: ARTERY EXPLORATION;  Surgeon: Rexene Alberts, MD;  Location: [place];  Service: Open Heart Surgery;  Laterality: Right;  Right carotid artery exploration    Family History  Problem Relation Age of Onset  . Stroke Father   . Diabetes Other   . Hypertension Mother    Social History:  reports that she quit smoking about 11 years ago. Her smoking use included Cigarettes. She has a 30 pack-year smoking history. She has never used smokeless tobacco. She reports that she does not drink alcohol or use illicit drugs.  Allergies:  Allergies  Allergen Reactions  . Ibuprofen Nausea And Vomiting  . Sulfamethoxazole W-Trimethoprim Swelling  . Sulfonamide Derivatives Swelling  . Tape Rash  . Tramadol Nausea And Vomiting  . Bactrim Swelling  . Red Dye Itching and Rash    Medications Prior to Admission  Medication Sig Dispense Refill  . cinacalcet (SENSIPAR) 60 MG tablet Take 60 mg by mouth every other day.      . dapsone 25 MG tablet Take 25 mg by mouth daily.       . diphenhydrAMINE (BENADRYL) 50 MG capsule Take 1 capsule (50  mg total) by mouth every 6 (six) hours as needed for itching.  90 capsule  3  . enoxaparin (LOVENOX) 80 MG/0.8ML injection Inject 0.8 mLs (80 mg total) into the skin every 12 (twelve) hours.  20 Syringe  0  . Flurandrenolide 4 MCG/SQCM TAPE Apply topically daily.      . furosemide (LASIX) 20 MG tablet Take 1 tablet by mouth daily.      Marland Kitchen glipiZIDE (GLUCOTROL XL) 10 MG 24 hr tablet Take 10 mg by mouth 2 (two) times daily.      Marland Kitchen  Magnesium 400 MG CAPS Take 800 mg by mouth daily.      . metoprolol tartrate (LOPRESSOR) 25 MG tablet Take 25 mg by mouth 2 (two) times daily.      . mycophenolate (MYFORTIC) 180 MG EC tablet Take 360 mg by mouth 2 (two) times daily.       Marland Kitchen nystatin (MYCOSTATIN) 100000 UNIT/ML suspension Take 500,000 Units by mouth daily. Swish and Spit 5 ml as directed      . pravastatin (PRAVACHOL) 40 MG tablet Take 40 mg by mouth daily. Take one tablet Mon, Wed, and Friday      . tacrolimus (PROGRAF) 1 MG capsule Take 5 mg by mouth 2 (two) times daily. Take 5 capsules in the am, and 5 capsules in the pm.      . triamcinolone cream (KENALOG) 0.1 % Apply 1 application topically 2 (two) times daily.       Marland Kitchen warfarin (COUMADIN) 5 MG tablet Take 5-7.5 mg by mouth daily. Take 5 mg on Monday, Wednesday, Thursday, Friday, Saturday, and Sunday.  Take 7.5 mg on Tuesday.         Lab Results:  Recent Labs  04/13/13 1600 04/13/13 1643 04/14/13 0400 04/15/13 0356  WBC 19.0*  --  19.3* 19.6*  HGB 9.5* 10.5* 9.8* 9.2*  HCT 28.7* 31.0* 29.3* 27.5*  PLT 144*  --  151 305   BMET  Recent Labs  04/13/13 0330  04/13/13 1643 04/14/13 0400 04/15/13 0356  NA 140  --  138 144 137  K 4.6  --  4.6 5.1 5.1  CL 108  --  107 107 101  CO2 19  --   --  23 23  GLUCOSE 111*  --  157* 112* 82  BUN 22  --  26* 32* 44*  CREATININE 1.33*  < > 1.60* 1.82* 2.17*  CALCIUM 8.1*  --   --  9.0 9.2  < > = values in this interval not displayed. LFT  Recent Labs  04/15/13 0356  PROT 6.1  ALBUMIN 2.8*   AST 72*  ALT 97*  ALKPHOS 111  BILITOT 0.3  BILIDIR <0.1  IBILI NOT CALCULATED   Dg Chest Port 1 View  04/15/2013   *RADIOLOGY REPORT*  Clinical Data: aortic valve replacement  PORTABLE CHEST - 1 VIEW  Comparison: Prior radiograph from 04/14/2013.  Findings: Prosthetic aortic valve with left atrial appendage clip are grossly stable.  Median sternotomy wires remain intact. Additional metallic clip overlying the lung base likely lies external to the patient. Left subclavian vascular sheath remains in place.  Cardiomegaly is stable.  Mild pulmonary edema is not significantly changed.  Bilateral pleural effusions, right greater than left persist with associated bibasilar opacities.  No pneumothorax.  No acute osseous abnormality.  IMPRESSION:  1.  Stable cardiomegaly with mild pulmonary edema. 2.  Bilateral pleural effusions, right greater than left, with associated bibasilar opacities, likely atelectasis.   Original Report Authenticated By: Jeannine Boga, M.D.   Dg Chest Port 1 View  04/14/2013   *RADIOLOGY REPORT*  Clinical Data: Atelectasis status post CABG  PORTABLE CHEST - 1 VIEW  Comparison: Prior chest x-ray 04/13/2013  Findings: Interval extubation, removal of nasogastric tube, mediastinal drain and left subclavian Swan-Ganz catheter. The left subclavian vascular sheath remains in place.  The patient is status post median sternotomy with evidence of aortic valve replacement and left atrial appendage ligation.  Pulmonary edema has significantly decreased.  There is persistent rounded patchy airspace opacity in the  bilateral lower lobes likely reflecting atelectasis.  Stable cardiomegaly.  No pneumothorax.  IMPRESSION:  Interval removal of support apparatus as above.  Slightly lower inspiratory volumes with increased bibasilar atelectasis.   Original Report Authenticated By: Jacqulynn Cadet, M.D.    ROS: complains of abdominal pain.  PHYSICAL EXAM: Blood pressure 124/58, pulse 98,  temperature 97.7 F (36.5 C), temperature source Oral, resp. rate 13, height 5\' 2"  (1.575 m), weight 203 lb (92.08 kg), SpO2 100.00%. HEENT: NCAT, non-rebreather in place, blood coming out of nose LUNGS: crackles apparent on left lower lobe, none on the right, no wheezes noted CARDIAC: rrr, ?rub heard, no JVD noted ABD: s, apparently NT on palpation, ND EXT: no edema noted NEURO: able to follow commands though with minimal verbal responses from the patient  Assessment: This patient is a 67 yo female with history of ESRD s/p renal transplant who is s/p aortic valve replacement and maze procedure now with rising Cr following procedure.  PLAN: 1. Acute on chronic kidney disease: patient with Cr trended back towards baseline following heart cath early in hospitalization and had been stable until the day of her operation. Since then her Cr has steadily risen to a high of 2.17 today. Notably she has had decrease in UOP even though has been receiving lasix. CVP to 15-20, ~9 kg increase in weight from home weight, CXR with apparent volume overload, and doughy edema in LE indicating likely volume overload. Patient has identifiable event (recent aortic valve replacement and maze with subsequent hypotension) leading to recent increase in Cr. Her current acute injury likely represents an ATN related to hypotension.  - will continue to trend Cr on daily renal panel - patient does not seem to have indications for emergent dialysis at this time - will increase diuresis to lasix 80 mg IV TID as patient appears volume overloaded and continue to monitor volume status with daily weights and strict I/O's - would recommend stopping vanc given renal dysfunction and switching to alternative - avoid nephrotoxic agents  2. 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 Had only been on prograf here. Most recent prograf level was 5.4 in early September. - will change prograf to liquid  formulation 5 mg BID - will add cellcept liquid formulation 500 mg BID   Tommi Rumps 04/15/2013, 2:34 PM  Patient seen and examined.  Agree with assessment and plan as above. Kelly Splinter  MD Pager [phone]    Cell  [phone] 04/26/2013, 12:51 PM

## 2013-04-15 NOTE — Progress Notes (Signed)
Patient is still thrashing her head in the bed, will not keep the NRB mask, she is still pulling at her mask/lines.  Patient WILL NOT keep her mask on, green safety mitts were applied and patient still pulled her mask off/thrashing her head side to side.  Will maintaining patient safety, pain medication was given when it was appropriate.  Patient is now on  at 6 L.  Patient is maintaining oxygen sats in the low to mid 90s.  Will monitor patient.

## 2013-04-15 NOTE — Progress Notes (Signed)
Patient still extremely restless and not tolerating BIPAP, thrashing her head, pulling her mask, and pulling her lines,  Pt switched to NRB at 100%, will monitor patient. Pain meds given, patient did state she was in pain.

## 2013-04-15 NOTE — Progress Notes (Signed)
Paged MD about patient being restless and not tolerating BIPAP, MD ordered CO-OX and Ativan 0.5 mg PO Q6H PRN. Updated MD on patient's vitals (VVS). Will monitor patient.

## 2013-04-15 NOTE — Progress Notes (Deleted)
Paged MD about patient being restless and not tolerating BIPAP, MD ordered CO-OX and Ativan 0.5 mg PO Q6H PRN. Updated MD on patient's vitals (VVS).  Will monitor patient.

## 2013-04-15 NOTE — Progress Notes (Signed)
Order to place NG tube to give oral contrast. Attempted via left nare by Lennie Muckle, RN. No obstruction or resistance met at 1230. Was able to advance between the 1st and 2nd marks. Unable to auscultated. Removed NG. Small amount of blood on NG tip.  Began coughing up blood and blood clots around 1245 and also bleeding from left nare. Dr. Darcey Nora. Dr. Darcey Nora informed around [id number]. Held BIlateral nares x 10 minutes per order. At 1318, no drainage from nares.

## 2013-04-15 NOTE — Progress Notes (Signed)
Patient requires bilateral elbow restraints because patient pushes up with her arm, given that the patient has recently open heart surgery, she is at a high risk of chest wires coming apart.  Md aware.    Also: per patients family, after her previous CVA, patient had difficulty swallowing and difficulty with her speech.  Around 0000, patient indicated she wanted some water, but upon giving the patient a cup of water with a straw, patient was not able to swallow the water, therefore patient was not given anything by mouth.  Patient will need SLP consult.

## 2013-04-15 NOTE — Progress Notes (Signed)
SUBJECTIVE:  Patient agitated this AM.  She required haldol, BiPAP.  She has had decreased UO with fluctuating CVPs.     PHYSICAL EXAM Filed Vitals:   04/15/13 0800 04/15/13 0900 04/15/13 1000 04/15/13 1100  BP: 115/57 124/58  107/72  Pulse: 105 98 108 101  Temp:      TempSrc:      Resp: 24 13 35 24  Height:      Weight:      SpO2: 97% 100% 97% 99%   General:  No acute distress but she is restless Lungs:  Decreased breath sounds Heart:  Tachycardia with rub Abdomen:  Decreased bowel sounds Extremities:  Trace edema  LABS:  Results for orders placed during the hospital encounter of 04/05/13 (from the past 24 hour(s))  GLUCOSE, CAPILLARY     Status: Abnormal   Collection Time    04/14/13  4:02 PM      Result Value Range   Glucose-Capillary 118 (*) 70 - 99 mg/dL   Comment 1 Documented in Chart     Comment 2 Notify RN    GLUCOSE, CAPILLARY     Status: Abnormal   Collection Time    04/14/13  8:17 PM      Result Value Range   Glucose-Capillary 127 (*) 70 - 99 mg/dL   Comment 1 Documented in Chart     Comment 2 Notify RN    CARBOXYHEMOGLOBIN     Status: Abnormal   Collection Time    04/14/13  8:37 PM      Result Value Range   Total hemoglobin 9.6 (*) 12.0 - 16.0 g/dL   O2 Saturation 92.3     Carboxyhemoglobin 1.6 (*) 0.5 - 1.5 %   Methemoglobin 1.7 (*) 0.0 - 1.5 %  URINALYSIS, ROUTINE W REFLEX MICROSCOPIC     Status: Abnormal   Collection Time    04/14/13  9:57 PM      Result Value Range   Color, Urine YELLOW  YELLOW   APPearance CLEAR  CLEAR   Specific Gravity, Urine 1.018  1.005 - 1.030   pH 5.0  5.0 - 8.0   Glucose, UA NEGATIVE  NEGATIVE mg/dL   Hgb urine dipstick NEGATIVE  NEGATIVE   Bilirubin Urine NEGATIVE  NEGATIVE   Ketones, ur NEGATIVE  NEGATIVE mg/dL   Protein, ur NEGATIVE  NEGATIVE mg/dL   Urobilinogen, UA 0.2  0.0 - 1.0 mg/dL   Nitrite NEGATIVE  NEGATIVE   Leukocytes, UA SMALL (*) NEGATIVE  URINE MICROSCOPIC-ADD ON     Status: Abnormal   Collection Time    04/14/13  9:57 PM      Result Value Range   WBC, UA 3-6  <3 WBC/hpf   RBC / HPF 0-2  <3 RBC/hpf   Bacteria, UA RARE  RARE   Casts HYALINE CASTS (*) NEGATIVE  PROCALCITONIN     Status: None   Collection Time    04/14/13 10:38 PM      Result Value Range   Procalcitonin 4.37    GLUCOSE, CAPILLARY     Status: None   Collection Time    04/15/13 12:00 AM      Result Value Range   Glucose-Capillary 97  70 - 99 mg/dL   Comment 1 Documented in Chart     Comment 2 Notify RN    BLOOD GAS, ARTERIAL     Status: Abnormal   Collection Time    04/15/13 12:30 AM      Result  Value Range   FIO2 0.40     Delivery systems BILEVEL POSITIVE AIRWAY PRESSURE     Inspiratory PAP 12     Expiratory PAP 5     pH, Arterial 7.304 (*) 7.350 - 7.450   pCO2 arterial 47.4 (*) 35.0 - 45.0 mmHg   pO2, Arterial 81.4  80.0 - 100.0 mmHg   Bicarbonate 22.9  20.0 - 24.0 mEq/L   TCO2 24.3  0 - 100 mmol/L   Acid-base deficit 2.5 (*) 0.0 - 2.0 mmol/L   O2 Saturation 94.4     Patient temperature 98.6     Collection site RIGHT BRACHIAL     Drawn by [phone]     Sample type ARTERIAL DRAW     Allens test (pass/fail) PASS  PASS  PROTIME-INR     Status: Abnormal   Collection Time    04/15/13  3:56 AM      Result Value Range   Prothrombin Time 17.8 (*) 11.6 - 15.2 seconds   INR 1.51 (*) 0.00 - 1.49  CBC     Status: Abnormal   Collection Time    04/15/13  3:56 AM      Result Value Range   WBC 19.6 (*) 4.0 - 10.5 K/uL   RBC 3.30 (*) 3.87 - 5.11 MIL/uL   Hemoglobin 9.2 (*) 12.0 - 15.0 g/dL   HCT 27.5 (*) 36.0 - 46.0 %   MCV 83.3  78.0 - 100.0 fL   MCH 27.9  26.0 - 34.0 pg   MCHC 33.5  30.0 - 36.0 g/dL   RDW 16.2 (*) 11.5 - 15.5 %   Platelets 305  150 - 400 K/uL  BASIC METABOLIC PANEL     Status: Abnormal   Collection Time    04/15/13  3:56 AM      Result Value Range   Sodium 137  135 - 145 mEq/L   Potassium 5.1  3.5 - 5.1 mEq/L   Chloride 101  96 - 112 mEq/L   CO2 23  19 - 32 mEq/L    Glucose, Bld 82  70 - 99 mg/dL   BUN 44 (*) 6 - 23 mg/dL   Creatinine, Ser 2.17 (*) 0.50 - 1.10 mg/dL   Calcium 9.2  8.4 - 10.5 mg/dL   GFR calc non Af Amer 22 (*) >90 mL/min   GFR calc Af Amer 26 (*) >90 mL/min  HEPATIC FUNCTION PANEL     Status: Abnormal   Collection Time    04/15/13  3:56 AM      Result Value Range   Total Protein 6.1  6.0 - 8.3 g/dL   Albumin 2.8 (*) 3.5 - 5.2 g/dL   AST 72 (*) 0 - 37 U/L   ALT 97 (*) 0 - 35 U/L   Alkaline Phosphatase 111  39 - 117 U/L   Total Bilirubin 0.3  0.3 - 1.2 mg/dL   Bilirubin, Direct <0.1  0.0 - 0.3 mg/dL   Indirect Bilirubin NOT CALCULATED  0.3 - 0.9 mg/dL  DIFFERENTIAL     Status: Abnormal   Collection Time    04/15/13  3:56 AM      Result Value Range   Neutrophils Relative % 80 (*) 43 - 77 %   Neutro Abs 15.6 (*) 1.7 - 7.7 K/uL   Lymphocytes Relative 5 (*) 12 - 46 %   Lymphs Abs 0.9  0.7 - 4.0 K/uL   Monocytes Relative 15 (*) 3 - 12 %   Monocytes  Absolute 2.9 (*) 0.1 - 1.0 K/uL   Eosinophils Relative 0  0 - 5 %   Eosinophils Absolute 0.0  0.0 - 0.7 K/uL   Basophils Relative 0  0 - 1 %   Basophils Absolute 0.0  0.0 - 0.1 K/uL  PROCALCITONIN     Status: None   Collection Time    04/15/13  3:56 AM      Result Value Range   Procalcitonin 4.21    GLUCOSE, CAPILLARY     Status: None   Collection Time    04/15/13  4:01 AM      Result Value Range   Glucose-Capillary 76  70 - 99 mg/dL   Comment 1 Documented in Chart     Comment 2 Notify RN    CARBOXYHEMOGLOBIN     Status: Abnormal   Collection Time    04/15/13  4:37 AM      Result Value Range   Total hemoglobin 9.4 (*) 12.0 - 16.0 g/dL   O2 Saturation 88.4     Carboxyhemoglobin 1.4  0.5 - 1.5 %   Methemoglobin 1.7 (*) 0.0 - 1.5 %  BLOOD GAS, ARTERIAL     Status: Abnormal   Collection Time    04/15/13  5:00 AM      Result Value Range   FIO2 0.60     Delivery systems BILEVEL POSITIVE AIRWAY PRESSURE     Inspiratory PAP 12     Expiratory PAP 5     pH, Arterial 7.296  (*) 7.350 - 7.450   pCO2 arterial 48.5 (*) 35.0 - 45.0 mmHg   pO2, Arterial 76.4 (*) 80.0 - 100.0 mmHg   Bicarbonate 22.9  20.0 - 24.0 mEq/L   TCO2 24.4  0 - 100 mmol/L   Acid-base deficit 2.6 (*) 0.0 - 2.0 mmol/L   O2 Saturation 93.3     Patient temperature 98.6     Collection site RIGHT BRACHIAL     Drawn by [phone]     Sample type ARTERIAL DRAW     Allens test (pass/fail) PASS  PASS  GLUCOSE, CAPILLARY     Status: Abnormal   Collection Time    04/15/13  7:36 AM      Result Value Range   Glucose-Capillary 51 (*) 70 - 99 mg/dL   Comment 1 Notify RN      Intake/Output Summary (Last 24 hours) at 04/15/13 1210 Last data filed at 04/15/13 1200  Gross per 24 hour  Intake 1049.09 ml  Output    731 ml  Net 318.09 ml    ASSESSMENT AND PLAN:  Respiratory failure:  Increased O2 requirements last night.  CXR with LUL infiltrate.  Starting atb.  CVP increased and echo pending.  CM on CXR.   We will follow up the results of the echo.  She has a rub.  CKD    Creatinine increasing.  Renal consulted.    [name] 04/15/2013 12:10 PM

## 2013-04-15 NOTE — Progress Notes (Signed)
Hypoglycemic Event  CBG: 66  Treatment: D50 IV 25 mL  Symptoms: None  Follow-up CBG: Time 1725-no strips available prior. CBG Result:77 Possible Reasons for Event: inadequate meal intake  Comments/MD notified:VanTrigt    Lucy Chris  Remember to initiate Hypoglycemia Order Set & complete

## 2013-04-15 NOTE — Progress Notes (Signed)
Patient has become increasing disoriented, delirious, and her work of breathing has increased, unable to oxygen sat on portable pulse ox, called MD due to the fact that patient is at a very high risk of requiring emergency bedside intubation.  Spoke with Dr. Prescott Gum, obtained orders for Haldol 4mg  IV x 1 STAT, ABG STAT, and BIPAP.  Informed MD that it requires several nurses to keep the patient safely in bed and for her safety, restraints were initiated, MD Aware.   Patient's overall VVS, lung sounds are unchanged.  Will monitor Patient closely.  AM LABS pending

## 2013-04-15 NOTE — Progress Notes (Signed)
3 Days Post-Op Procedure(s) (LRB): AORTIC VALVE REPLACEMENT (AVR) (N/A) MAZE (N/A) INTRAOPERATIVE TRANSESOPHAGEAL ECHOCARDIOGRAM (N/A) Subjective: Overall clinical condition worse overnight with delirium-altered mental status, BiPAP requirement to maintain O2 sat, decreased urine output with increasing creatinine.  Blood pressure remained stable, CVP 15-20, sinus rhythm 104  Chest x-ray shows increased vascular congestion, probable left upper lobe airspace disease-probable pneumonia. Broad-spectrum antibiotics started-vancomycin and Zosyn adjusted for renal function. Sputum blood and urine culture submitted. ABG on BiPAP is satisfactory this morning  Patient also complaining of abdominal pain, right side. With evidence of sepsis--WBC 20,000, elevated pro calcitonin, and very elevated co-ox in immunosuppression patient will get abdominal CT scan to evaluate for source of sepsis. Patient was hypoglycemic this a.m. and will stop Levimir insulin. Patient not acidotic  Renal consult submitted to help manage renal dysfunction and manage oral immunosuppressive drugs for kidney transplant. Potassium 5.1  Objective: Vital signs in last 24 hours: Temp:  [97.6 F (36.4 C)-98.4 F (36.9 C)] 97.7 F (36.5 C) (09/28 0738) Pulse Rate:  [83-129] 98 (09/28 0900) Cardiac Rhythm:  [-] Sinus tachycardia (09/28 0800) Resp:  [13-37] 13 (09/28 0900) BP: (65-143)/(39-113) 124/58 mmHg (09/28 0900) SpO2:  [82 %-100 %] 100 % (09/28 0900) FiO2 (%):  [40 %-100 %] 50 % (09/28 0905) Weight:  [203 lb (92.08 kg)] 203 lb (92.08 kg) (09/28 0500)  Hemodynamic parameters for last 24 hours: CVP:  [16 mmHg-35 mmHg] 16 mmHg  Intake/Output from previous day: 09/27 0701 - 09/28 0700 In: 1347.5 [P.O.:480; I.V.:417.5; IV Piggyback:450] Out: 810 [Urine:810] Intake/Output this shift: Total I/O In: 40.2 [I.V.:40.2] Out: 45 [Urine:45]  Exam Patient confused, agitated Diminished breath sounds bilaterally Extremities  warm Tenderness right lower quadrant  Lab Results:  Recent Labs  04/14/13 0400 04/15/13 0356  WBC 19.3* 19.6*  HGB 9.8* 9.2*  HCT 29.3* 27.5*  PLT 151 305   BMET:  Recent Labs  04/14/13 0400 04/15/13 0356  NA 144 137  K 5.1 5.1  CL 107 101  CO2 23 23  GLUCOSE 112* 82  BUN 32* 44*  CREATININE 1.82* 2.17*  CALCIUM 9.0 9.2    PT/INR:  Recent Labs  04/15/13 0356  LABPROT 17.8*  INR 1.51*   ABG    Component Value Date/Time   PHART 7.296* 04/15/2013 0500   HCO3 22.9 04/15/2013 0500   TCO2 24.4 04/15/2013 0500   ACIDBASEDEF 2.6* 04/15/2013 0500   O2SAT 93.3 04/15/2013 0500   CBG (last 3)   Recent Labs  04/15/13 04/15/13 0401 04/15/13 0736  GLUCAP 97 76 51*    Assessment/Plan: S/P Procedure(s) (LRB): AORTIC VALVE REPLACEMENT (AVR) (N/A) MAZE (N/A) INTRAOPERATIVE TRANSESOPHAGEAL ECHOCARDIOGRAM (N/A) Followup cultures, continue broad-spectrum antibiotics Check 2-D echocardiogram and abdominal CT scan Continue Lovenox, INR 1.5 on oral Coumadin 2.5 daily  LOS: 10 days    VAN TRIGT III,Vanity Larsson 04/15/2013

## 2013-04-15 NOTE — Progress Notes (Signed)
CT surgery p.m. Rounds  Patient's pulmonary status appears improved this evening with satisfactory blood gas and O2 sats on oxygen mask.  CT abdomen without evidence of abscess or ischemic colitis  2-D echocardiogram shows excellent LV function and artery function, normal aortic valve function and moderate TR.  Dopamine remains at renal dose with stable blood pressure. Pancultured with results pending.  P.m. creatinine 2.4, recommendations by nephrology are being followed.  Situation reviewed with patient and family on evening rounds. They understand that she probably has postoperative pneumonia with postoperative transient renal dysfunction but with good cardiac function.

## 2013-04-15 NOTE — Progress Notes (Signed)
Paged MD about Co-OX results. MD will put in orders for cultures/labs/abx.  Will monitor the patient.  Patient vital signs stable, patient neuro status unchanged, still restless.

## 2013-04-15 NOTE — Progress Notes (Signed)
  Echocardiogram 2D Echocardiogram has been performed.  [name] 04/15/2013, 3:54 PM

## 2013-04-15 NOTE — Progress Notes (Signed)
Hypoglycemic Event  CBG:51  Treatment: D50 IV 25 mL  Symptoms: None  Follow-up CBG: [id number]  CBG Result:100  Possible Reasons for Event: Inadequate meal intake and Change in activity  Comments/MD notified:notified on rounds    Lucy Chris  Remember to initiate Hypoglycemia Order Set & complete

## 2013-04-15 NOTE — Progress Notes (Signed)
Hypoglycemic Event  CBG: 53  Treatment: D50 IV 25 mL  Symptoms: None  Follow-up CBG: 1254  CBG Result:107  Possible Reasons for Event: medication regimen and inadequate meal intake  Comments/MD notified:Dr. Hochrein on unit and Dr. Jonnie Finner on unit.    [name]  Remember to initiate Hypoglycemia Order Set & complete

## 2013-04-16 ENCOUNTER — Inpatient Hospital Stay (HOSPITAL_COMMUNITY): Payer: Medicare Other

## 2013-04-16 LAB — CBC
HCT: 24.5 % — ABNORMAL LOW (ref 36.0–46.0)
Hemoglobin: 8.2 g/dL — ABNORMAL LOW (ref 12.0–15.0)
MCH: 27.7 pg (ref 26.0–34.0)
MCHC: 33.5 g/dL (ref 30.0–36.0)
MCV: 82.8 fL (ref 78.0–100.0)
Platelets: 188 10*3/uL (ref 150–400)
RBC: 2.96 MIL/uL — ABNORMAL LOW (ref 3.87–5.11)
RDW: 16 % — ABNORMAL HIGH (ref 11.5–15.5)
WBC: 13.3 10*3/uL — ABNORMAL HIGH (ref 4.0–10.5)

## 2013-04-16 LAB — GLUCOSE, CAPILLARY
Glucose-Capillary: 100 mg/dL — ABNORMAL HIGH (ref 70–99)
Glucose-Capillary: 119 mg/dL — ABNORMAL HIGH (ref 70–99)
Glucose-Capillary: 128 mg/dL — ABNORMAL HIGH (ref 70–99)
Glucose-Capillary: 140 mg/dL — ABNORMAL HIGH (ref 70–99)
Glucose-Capillary: 147 mg/dL — ABNORMAL HIGH (ref 70–99)
Glucose-Capillary: 148 mg/dL — ABNORMAL HIGH (ref 70–99)
Glucose-Capillary: 65 mg/dL — ABNORMAL LOW (ref 70–99)
Glucose-Capillary: 77 mg/dL (ref 70–99)

## 2013-04-16 LAB — BLOOD GAS, ARTERIAL
Acid-base deficit: 1.9 mmol/L (ref 0.0–2.0)
Bicarbonate: 22.8 mEq/L (ref 20.0–24.0)
Delivery systems: POSITIVE
Drawn by: 13898
Expiratory PAP: 7
FIO2: 0.5 %
Inspiratory PAP: 12
O2 Saturation: 97.1 %
Patient temperature: 98.2
TCO2: 24.1 mmol/L (ref 0–100)
pCO2 arterial: 41.6 mmHg (ref 35.0–45.0)
pH, Arterial: 7.357 (ref 7.350–7.450)
pO2, Arterial: 97.2 mmHg (ref 80.0–100.0)

## 2013-04-16 LAB — BASIC METABOLIC PANEL
BUN: 50 mg/dL — ABNORMAL HIGH (ref 6–23)
CO2: 23 mEq/L (ref 19–32)
Calcium: 9 mg/dL (ref 8.4–10.5)
Chloride: 103 mEq/L (ref 96–112)
Creatinine, Ser: 2.34 mg/dL — ABNORMAL HIGH (ref 0.50–1.10)
GFR calc Af Amer: 23 mL/min — ABNORMAL LOW (ref >90)
GFR calc non Af Amer: 20 mL/min — ABNORMAL LOW (ref >90)
Glucose, Bld: 174 mg/dL — ABNORMAL HIGH (ref 70–99)
Potassium: 4 mEq/L (ref 3.5–5.1)
Sodium: 139 mEq/L (ref 135–145)

## 2013-04-16 LAB — URINE CULTURE
Colony Count: NO GROWTH
Culture: NO GROWTH

## 2013-04-16 LAB — PROTIME-INR: INR: 1.62 — ABNORMAL HIGH (ref 0.00–1.49)

## 2013-04-16 LAB — CARBOXYHEMOGLOBIN
Carboxyhemoglobin: 1.2 % (ref 0.5–1.5)
Methemoglobin: 1 % (ref 0.0–1.5)
O2 Saturation: 88.5 %
Total hemoglobin: 8.1 g/dL — ABNORMAL LOW (ref 12.0–16.0)

## 2013-04-16 MED ORDER — DEXTROSE 50 % IV SOLN
25.0000 mL | Freq: Once | INTRAVENOUS | Status: AC | PRN
  Administered 2013-04-16: 25 mL via INTRAVENOUS

## 2013-04-16 MED ORDER — DEXTROSE 50 % IV SOLN: 1.0000 | Freq: Once | INTRAVENOUS | Status: DC

## 2013-04-16 MED ORDER — VANCOMYCIN HCL IN DEXTROSE 1-5 GM/200ML-% IV SOLN
1000.0000 mg | INTRAVENOUS | Status: DC
  Administered 2013-04-16 – 2013-04-18 (×3): 1000 mg via INTRAVENOUS
  Filled 2013-04-16 (×5): qty 200

## 2013-04-16 MED ORDER — LABETALOL HCL 5 MG/ML IV SOLN
10.0000 mg | INTRAVENOUS | Status: DC | PRN
  Administered 2013-04-16 – 2013-04-17 (×3): 10 mg via INTRAVENOUS
  Filled 2013-04-16 (×5): qty 4

## 2013-04-16 MED FILL — Sodium Chloride IV Soln 0.9%: INTRAVENOUS | Qty: 1000 | Status: AC

## 2013-04-16 MED FILL — Heparin Sodium (Porcine) Inj 1000 Unit/ML: INTRAMUSCULAR | Qty: 30 | Status: AC

## 2013-04-16 MED FILL — Potassium Chloride Inj 2 mEq/ML: INTRAVENOUS | Qty: 40 | Status: AC

## 2013-04-16 MED FILL — Magnesium Sulfate Inj 50%: INTRAMUSCULAR | Qty: 10 | Status: AC

## 2013-04-16 NOTE — Progress Notes (Signed)
Rehab Admissions Coordinator Note:  Patient was screened by Theora Master for appropriateness for an Inpatient Acute Rehab Consult.  At this time, we are recommending Inpatient Rehab consult.  Theora Master 04/16/2013, 10:22 AM  I can be reached at [phone].

## 2013-04-16 NOTE — Progress Notes (Signed)
I have seen and examined this patient and agree with the plan of care. Improved renal function Janki Dike W 04/16/2013, 12:18 PM

## 2013-04-16 NOTE — Progress Notes (Signed)
[facility] Progress Note    Assessment/ Plan:   1. Acute on chronic kidney disease: patient with Cr trended back towards baseline following heart cath early in hospitalization and had been stable until the day of her operation. Since then her Cr has steadily risen to a high of 2.42 yesterday. Now improved to 2.34. Weight down 4 kg today and with less crackles on exam likely indicating improved volume status. This acute worsening is likely related to hypotensive episode following surgery. - will continue to trend Cr on daily renal panel  - patient does not seem to have indications for emergent dialysis at this time  - will continue diuresis to lasix 80 mg IV TID as she has responded well to this and has improved renal function  - avoid nephrotoxic agents   2. 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 Had only been on prograf here. Most recent prograf level was 5.4 in early September.  - will continue prograf to liquid formulation 5 mg BID  - will continue cellcept liquid formulation 500 mg BID   Subjective:   States is better and breathing better than yesterday. Off BiPAP.   Objective:   BP 123/63  Pulse 74  Temp(Src) 97.8 F (36.6 C) (Oral)  Resp 20  Ht 5\' 2"  (1.575 m)  Wt 194 lb 4.8 oz (88.134 kg)  BMI 35.53 kg/m2  SpO2 99%  Intake/Output Summary (Last 24 hours) at 04/16/13 [id number] Last data filed at 04/16/13 0800  Gross per 24 hour  Intake 801.29 ml  Output   1586 ml  Net -784.71 ml   Weight change: -8 lb 11.2 oz (-3.946 kg)  Physical Exam: Gen: NAD, sitting in bedside chair CVS: rrr, no mrg appreciated Resp: few LLL crackles otherwise clear Abd: s, NT, ND Ext: no edema noted  Imaging: Ct Abdomen Pelvis Wo Contrast  04/15/2013   CLINICAL DATA:  Patient complaining of abdominal pain on the right side. Patient has clinical evidence of sepsis. Evaluate for possible source of sepsis. History of a renal transplant and  aortic valve replacement.  EXAM: CT ABDOMEN AND PELVIS WITHOUT CONTRAST  TECHNIQUE: Multidetector CT imaging of the abdomen and pelvis was performed following the standard protocol without intravenous contrast.  COMPARISON:  11/21/2008  FINDINGS: There are small effusions and significant dependent lung base opacity, greater on the right. This may all be atelectasis. Infiltrate should be considered likely in the proper clinical setting. The heart is moderately enlarged. There are dense coronary artery calcifications and changes from the aortic valve replacement.  The left kidney has been removed. The right kidney is atrophic. Small areas of low attenuation are noted in within the right kidney that are likely cysts. They are stable.  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 It is normal in size attenuation with no masses. There is no hydronephrosis.  Normal liver and spleen. The gallbladder is surgically absent. No bile duct dilation. The pancreas is unremarkable.  There is thickening of the right adrenal gland likely hyperplasia. This is stable.  There are no pathologically enlarged lymph nodes. There are no abnormal fluid collections or ascites.  There is ectasia throughout the abdominal aorta. Dense atherosclerotic calcifications are noted along the aorta the iliac arteries and the smaller branch vessels.  There are few sigmoid colon diverticula. The colon is otherwise unremarkable. The small bowel is within normal limits. There is no evidence of ischemic bowel.  There are  areas of hyperattenuation in the subcutaneous fat in the left lower quadrant. This is nonspecific. They may be related to previous subcutaneous injections reflecting areas of hemorrhage.  There is a small fat containing umbilical hernia.  Degenerative changes are noted throughout the spine. There are no osteoblastic or osteolytic lesions.  Diffuse subcutaneous edema is noted along the anterolateral aspect of the lower  abdomen and pelvis.  IMPRESSION: 1. Dependent lung base opacity, right greater than left, associated with small effusions. This may all be atelectasis, but given the symptoms of sepsis, pneumonia should be considered. 2. There is no convincing source of infection below the diaphragm. There is no evidence of ischemic bowel. No abscess is seen. 3. There are multiple chronic findings as detailed   Electronically Signed   By: Lajean Manes   On: 04/15/2013 15:06   Dg Chest Port 1 View  04/16/2013   *RADIOLOGY REPORT*  Clinical Data: Aortic valve replacement.  PORTABLE CHEST - 1 VIEW  Comparison: 04/15/2013.  Findings: Trachea is midline.  Heart is enlarged, stable.  Eight intact sternotomy wires, unchanged in position. Left subclavian catheter sheath remains in place.  Scattered bilateral air space disease, some of which is linear in configuration.  Overall, aeration has improved slightly in the interval.  Small bilateral pleural effusions.  Lucencies projecting over the inferomedial aspects of both hemithoraces presumably represent bowel loops.  IMPRESSION:  1.  Scattered bilateral air space disease, with improving aeration at the lung bases.  Findings may be due to a combination of mild edema and atelectasis. 2.  Curvilinear lucencies projecting over the anteromedial aspects of both hemithoraces and may represent bowel loops.  Continued attention on follow-up exams is warranted. 3.  Small bilateral pleural effusions.   Original Report Authenticated By: Lorin Picket, M.D.   Dg Chest Port 1 View  04/15/2013   *RADIOLOGY REPORT*  Clinical Data: aortic valve replacement  PORTABLE CHEST - 1 VIEW  Comparison: Prior radiograph from 04/14/2013.  Findings: Prosthetic aortic valve with left atrial appendage clip are grossly stable.  Median sternotomy wires remain intact. Additional metallic clip overlying the lung base likely lies external to the patient. Left subclavian vascular sheath remains in place.  Cardiomegaly  is stable.  Mild pulmonary edema is not significantly changed.  Bilateral pleural effusions, right greater than left persist with associated bibasilar opacities.  No pneumothorax.  No acute osseous abnormality.  IMPRESSION:  1.  Stable cardiomegaly with mild pulmonary edema. 2.  Bilateral pleural effusions, right greater than left, with associated bibasilar opacities, likely atelectasis.   Original Report Authenticated By: Jeannine Boga, M.D.    Labs: BMET  Recent Labs Lab 04/11/13 0530  04/12/13 0400  04/12/13 1857 04/13/13 0330 04/13/13 1600 04/13/13 1643 04/14/13 0400 04/15/13 0356 04/15/13 1638 04/16/13 0344  NA 138  < > 142  < > 139 140  --  138 144 137 139 139  K 4.8  < > 4.4  < > 4.2 4.6  --  4.6 5.1 5.1 4.4 4.0  CL 101  --  105  --  109 108  --  107 107 101 104 103  CO2 24  --  26  --   --  19  --   --  23 23 23 23   GLUCOSE 151*  < > 140*  < > 99 111*  --  157* 112* 82 96 174*  BUN 31*  --  23  --  19 22  --  26* 32* 44* 49*  50*  CREATININE 1.52*  --  1.26*  < > 1.30* 1.33* 1.61* 1.60* 1.82* 2.17* 2.42* 2.34*  CALCIUM 9.1  --  8.7  --   --  8.1*  --   --  9.0 9.2 9.2 9.0  < > = values in this interval not displayed. CBC  Recent Labs Lab 04/13/13 1600 04/13/13 1643 04/14/13 0400 04/15/13 0356 04/16/13 0344  WBC 19.0*  --  19.3* 19.6* 13.3*  NEUTROABS  --   --   --  15.6*  --   HGB 9.5* 10.5* 9.8* 9.2* 8.2*  HCT 28.7* 31.0* 29.3* 27.5* 24.5*  MCV 83.9  --  83.7 83.3 82.8  PLT 144*  --  151 305 188    Medications:    . antiseptic oral rinse  15 mL Mouth Rinse BID  . bisacodyl  10 mg Oral Daily   Or  . bisacodyl  10 mg Rectal Daily  . cinacalcet  60 mg Oral Q48H  . dapsone  25 mg Oral Daily  . docusate sodium  200 mg Oral Daily  . furosemide  80 mg Intravenous TID  . insulin aspart  0-24 Units Subcutaneous Q4H  . mycophenolate  500 mg Oral BID  . pantoprazole (PROTONIX) IV  40 mg Intravenous Q24H  . piperacillin-tazobactam (ZOSYN)  IV  3.375 g  Intravenous Q8H  . sodium bicarbonate  50 mEq Intravenous Once  . sodium chloride  3 mL Intravenous Q12H  . tacrolimus  5 mg Oral BID  . warfarin  2.5 mg Oral q1800  . Warfarin - Physician Dosing Inpatient   Does not apply q1800      Tommi Rumps, MD 04/16/2013, 8:19 AM

## 2013-04-16 NOTE — Progress Notes (Signed)
ANTIBIOTIC CONSULT NOTE - FOLLOW UP  Pharmacy Consult for vancomycin Indication: pneumonia  Allergies  Allergen Reactions  . Ibuprofen Nausea And Vomiting  . Sulfamethoxazole W-Trimethoprim Swelling  . Sulfonamide Derivatives Swelling  . Tape Rash  . Tramadol Nausea And Vomiting  . Bactrim Swelling  . Red Dye Itching and Rash    Patient Measurements: Height: 5\' 2"  (157.5 cm) Weight: 194 lb 4.8 oz (88.134 kg) IBW/kg (Calculated) : 50.1  Vital Signs: Temp: 97.8 F (36.6 C) (09/29 0732) Temp src: Oral (09/29 0732) BP: 99/54 mmHg (09/29 1030) Pulse Rate: 71 (09/29 1030) Intake/Output from previous day: 09/28 0701 - 09/29 0700 In: 787.1 [I.V.:674.6; IV Piggyback:112.5] Out: [id number] [Urine:1511] Intake/Output from this shift: Total I/O In: 99.7 [I.V.:87.2; IV Piggyback:12.5] Out: 230 [Urine:230]  Labs:  Recent Labs  04/14/13 0400 04/15/13 0356 04/15/13 1638 04/16/13 0344  WBC 19.3* 19.6*  --  13.3*  HGB 9.8* 9.2*  --  8.2*  PLT 151 305  --  188  CREATININE 1.82* 2.17* 2.42* 2.34*   Estimated Creatinine Clearance: 23.4 ml/min (by C-G formula based on Cr of 2.34).  Recent Labs  04/15/13 2200  VANCORANDOM 21.7     Microbiology: Recent Results (from the past 720 hour(s))  URINE CULTURE     Status: None   Collection Time    04/07/13  5:53 AM      Result Value Range Status   Specimen Description URINE, CLEAN CATCH   Final   Special Requests NONE   Final   Culture  Setup Time     Final   Value: 04/07/2013 15:27     Performed at [facility]     Final   Value: >=100,000 COLONIES/ML     Performed at [facility]   Culture     Final   Value: Multiple bacterial morphotypes present, none predominant. Suggest appropriate recollection if clinically indicated.     Performed at [facility]   Report Status 04/08/2013 FINAL   Final  SURGICAL PCR SCREEN     Status: None   Collection Time    04/07/13  4:45 PM      Result Value  Range Status   MRSA, PCR NEGATIVE  NEGATIVE Final   Staphylococcus aureus NEGATIVE  NEGATIVE Final   Comment:            The Xpert SA Assay (FDA     approved for NASAL specimens     in patients over 44 years of age),     is one component of     a comprehensive surveillance     program.  Test performance has     been validated by [employer] for patients greater     than or equal to 64 year old.     It is not intended     to diagnose infection nor to     guide or monitor treatment.  URINE CULTURE     Status: None   Collection Time    04/14/13  9:57 PM      Result Value Range Status   Specimen Description URINE, CATHETERIZED   Final   Special Requests Immunocompromised   Final   Culture  Setup Time     Final   Value: 04/15/2013 03:26     Performed at [place]     Final   Value: NO GROWTH     Performed at  [facility]     Final   Value: NO GROWTH     Performed at [facility]   Report Status 04/16/2013 FINAL   Final  CULTURE, BLOOD (SINGLE)     Status: None   Collection Time    04/14/13 10:38 PM      Result Value Range Status   Specimen Description BLOOD RIGHT HAND   Final   Special Requests BOTTLES DRAWN AEROBIC AND ANAEROBIC 2CC   Final   Culture  Setup Time     Final   Value: 04/15/2013 02:58     Performed at [facility]   Culture     Final   Value:        BLOOD CULTURE RECEIVED NO GROWTH TO DATE CULTURE WILL BE HELD FOR 5 DAYS BEFORE ISSUING A FINAL NEGATIVE REPORT     Performed at [facility]   Report Status PENDING   Incomplete  CULTURE, RESPIRATORY (NON-EXPECTORATED)     Status: None   Collection Time    04/15/13  1:30 PM      Result Value Range Status   Specimen Description TRACHEAL ASPIRATE   Final   Special Requests Immunocompromised   Final   Gram Stain PENDING   Incomplete   Culture     Final   Value: NO GROWTH     Performed at [facility]   Report Status PENDING    Incomplete    Anti-infectives   Start     Dose/Rate Route Frequency Ordered Stop   04/16/13 1200  vancomycin (VANCOCIN) IVPB 1000 mg/200 mL premix     1,000 mg 200 mL/hr over 60 Minutes Intravenous Every 24 hours 04/16/13 1104     04/15/13 2200  vancomycin (VANCOCIN) 1,250 mg in sodium chloride 0.9 % 250 mL IVPB  Status:  Discontinued     1,250 mg 166.7 mL/hr over 90 Minutes Intravenous Every 24 hours 04/14/13 2221 04/15/13 1819   04/14/13 2230  vancomycin (VANCOCIN) 1,250 mg in sodium chloride 0.9 % 250 mL IVPB     1,250 mg 166.7 mL/hr over 90 Minutes Intravenous  Once 04/14/13 2221 04/15/13 0018   04/14/13 2200  piperacillin-tazobactam (ZOSYN) IVPB 3.375 g     3.375 g 12.5 mL/hr over 240 Minutes Intravenous 3 times per day 04/14/13 2155     04/14/13 1000  dapsone tablet 25 mg     25 mg Oral Daily 04/13/13 0919     04/13/13 2000  cefUROXime (ZINACEF) 1.5 g in dextrose 5 % 50 mL IVPB     1.5 g 100 mL/hr over 30 Minutes Intravenous Every 12 hours 04/12/13 1722 04/14/13 2057   04/12/13 2000  vancomycin (VANCOCIN) IVPB 1000 mg/200 mL premix     1,000 mg 200 mL/hr over 60 Minutes Intravenous  Once 04/12/13 1330 04/12/13 2159   04/12/13 1800  cefUROXime (ZINACEF) 1.5 g in dextrose 5 % 50 mL IVPB  Status:  Discontinued     1.5 g 100 mL/hr over 30 Minutes Intravenous Every 12 hours 04/12/13 1330 04/12/13 1722   04/12/13 0400  vancomycin (VANCOCIN) 1,250 mg in sodium chloride 0.9 % 250 mL IVPB     1,250 mg 166.7 mL/hr over 90 Minutes Intravenous To Surgery 04/11/13 2113 04/12/13 0815   04/12/13 0400  cefUROXime (ZINACEF) 1.5 g in dextrose 5 % 50 mL IVPB  Status:  Discontinued     1.5 g 100 mL/hr over 30 Minutes Intravenous To Surgery 04/11/13 2113  04/12/13 1328   04/12/13 0400  cefUROXime (ZINACEF) 750 mg in dextrose 5 % 50 mL IVPB  Status:  Discontinued     750 mg 100 mL/hr over 30 Minutes Intravenous To Surgery 04/11/13 2055 04/12/13 1328   04/12/13 0400  vancomycin (VANCOCIN) 1,000 mg  in sodium chloride 0.9 % 1,000 mL irrigation  Status:  Discontinued      Irrigation To Surgery 04/11/13 2113 04/12/13 1330   04/12/13 0400  vancomycin (VANCOCIN) 1,000 mg in sodium chloride 0.9 % 1,000 mL irrigation      Irrigation To Surgery 04/11/13 2115 04/12/13 0710   04/11/13 1800  [MAR Hold]  cefUROXime (ZINACEF) 1.5 g in dextrose 5 % 50 mL IVPB     (On MAR Hold since 04/12/13 0611)   1.5 g 100 mL/hr over 30 Minutes Intravenous Every 12 hours 04/11/13 1537 04/12/13 1212   04/11/13 0400  vancomycin (VANCOCIN) 1,500 mg in sodium chloride 0.9 % 250 mL IVPB     1,500 mg 125 mL/hr over 120 Minutes Intravenous To Surgery 04/10/13 1407 04/11/13 0934   04/11/13 0400  cefUROXime (ZINACEF) 1.5 g in dextrose 5 % 50 mL IVPB     1.5 g 100 mL/hr over 30 Minutes Intravenous To Surgery 04/10/13 1407 04/11/13 0935   04/11/13 0400  cefUROXime (ZINACEF) 750 mg in dextrose 5 % 50 mL IVPB  Status:  Discontinued     750 mg 100 mL/hr over 30 Minutes Intravenous To Surgery 04/10/13 1406 04/11/13 1506   04/11/13 0400  vancomycin (VANCOCIN) 1,000 mg in sodium chloride 0.9 % 1,000 mL irrigation  Status:  Discontinued      Irrigation To Surgery 04/10/13 1406 04/11/13 1506   04/06/13 1000  dapsone tablet 25 mg  Status:  Discontinued     25 mg Oral Daily 04/05/13 1413 04/11/13 1728      Assessment: 67 yo female on vancomycin and Zosyn for pneumonia. Patient received one dose of vancomycin 1250 mg IV on 9/27. A random vancomycin level was ordered by [facility] on 9/28 and which was 21.7 (~24 hrs post-dose). Scheduled doses of vancomycin were discontinued by [facility] but follow-up was not communicated to pharmacy. I spoke with Dr. Roxy Manns this morning and he clarified patient is still to be on vancomycin with pharmacy to dose. Will re-dose vancomycin now as vancomycin level should be <20 now.  SCr is trending down and renal function appear to be improving. UOP is good at 0.7 ml/kg/hr. WBC are trending down and patient is  afebrile.  Antibiotics: Vanc 9/27 >> Zosyn 9/27 >>  9/28 VR 21.7 (~24 hrs post 1st dose)  Cultures: 9/27 BCx1>ngtd 9/27 UCx>neg  Goal of Therapy:  Vancomycin trough level 15-20 mcg/ml  Plan:  -Resume vancomycin at 1000 mg IV q24h -Continue Zosyn 3.375 g IV q8h to be infused over 4 hours -Monitor renal function, clinical course and culture data  [facility], Pharm.D., BCPS Clinical Pharmacist Pager: [phone] 04/16/2013 11:09 AM

## 2013-04-16 NOTE — Progress Notes (Signed)
Physical Therapy Treatment Patient Details Name: JUHI TIEU MRN: [id number] DOB: 09/07/1945 Today's Date: 04/16/2013 Time: [id number] PT Time Calculation (min): 25 min  PT Assessment / Plan / Recommendation  History of Present Illness Ms. 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 Pt with severe aortic stenosis now s/p AVR on 9/24.     PT Comments   Pt with increased mobility today able to begin hall ambulation but limited by cognition and safety with mobility. Pt aware she had heart surgery but unable to recall precautions, direct RW, or position self with transfers. Will continue to follow and encouraged increased mobility with nursing.   Follow Up Recommendations        Does the patient have the potential to tolerate intense rehabilitation     Barriers to Discharge        Equipment Recommendations       Recommendations for Other Services    Frequency     Progress towards PT Goals Progress towards PT goals: Progressing toward goals  Plan Current plan remains appropriate    Precautions / Restrictions Precautions Precautions: Sternal;Fall Precaution Comments: pt able to state 2 precautions despite education x 3 during session with teach back   Pertinent Vitals/Pain HR 80-90 throughout sats 92-100% on 6L with several brief episodes of lower sats but poor wave form 8/10 end of session, repositioned and pt drifting to sleep    Mobility  Bed Mobility Bed Mobility: Sit to Sidelying Left Sit to Sidelying Left: 3: Mod assist;HOB flat Details for Bed Mobility Assistance: cueing for sequence and safety with assist to elevate legs onto surface Transfers Sit to Stand: 1: +2 Total assist;From chair/3-in-1 Sit to  Stand: Patient Percentage: 70% Stand to Sit: 4: Min assist;To bed Details for Transfer Assistance: cueing for sequence, safety, hand placement with cueing for positioning with return to bed surface Ambulation/Gait Ambulation/Gait Assistance: 1: +2 Total assist Ambulation/Gait: Patient Percentage: 80% Ambulation Distance (Feet): 110 Feet Assistive device: Rolling walker Ambulation/Gait Assistance Details: cueing for posture, position in RW and assist to direct and maintain safety with RW. Pt impulsive at times with variation between too close and too far away from RW despite multimodal cueing. Extra person to maintain lines and additional 3rd person for chair but do not feel chair needed next trial. pt needed both hands of PT on pt at all times for safety and stability with gait. Gait Pattern: Step-through pattern;Decreased stride length;Trunk flexed Gait velocity: decreased    Exercises General Exercises - Lower Extremity Heel Slides: AROM;Both;10 reps;Supine Hip ABduction/ADduction: AROM;Both;10 reps;Supine   PT Diagnosis:    PT Problem List:   PT Treatment Interventions:     PT Goals (current goals can now be found in the care plan section)    Visit Information  Last PT Received On: 04/16/13 Assistance Needed: +2 (safety with gait and lines) History of Present Illness: Ms. 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 Pt with severe aortic stenosis  now s/p AVR on 9/24.      Subjective Data      Cognition  Cognition Arousal/Alertness: Awake/alert Behavior During Therapy: Flat affect Overall Cognitive Status: Impaired/Different from baseline Area of Impairment: Orientation;Problem  solving;Safety/judgement Orientation Level: Time Memory: Decreased recall of precautions;Decreased short-term memory Safety/Judgement: Decreased awareness of safety Problem Solving: Requires verbal cues;Requires tactile cues    Balance     End of Session PT - End of Session Equipment Utilized During Treatment: Gait belt;Oxygen Activity Tolerance: Patient tolerated treatment well Patient left: in bed;with call bell/phone within reach;with nursing/sitter in room;with family/visitor present Nurse Communication: Mobility status   GP     Melford Aase 04/16/2013, 1:53 PM Elwyn Reach, [place]

## 2013-04-16 NOTE — Progress Notes (Signed)
[address]       [place] [address]             [phone]        CARDIOTHORACIC SURGERY PROGRESS NOTE   R4 Days Post-Op Procedure(s) (LRB): AORTIC VALVE REPLACEMENT (AVR) (N/A) MAZE (N/A) INTRAOPERATIVE TRANSESOPHAGEAL ECHOCARDIOGRAM (N/A)  Subjective: Some agitation and delirium reported overnight.  At present patient looks good.  Alert and breathing comfortably.  Denies pain or SOB.  Some concerns raised about swallowing function and patient has long h/o issues with swallowing and "spitting" prior to surgery - has been seen by Dr Harlow Mares from ENT in the past.  Objective: Vital signs: BP Readings from Last 1 Encounters:  04/16/13 123/63   Pulse Readings from Last 1 Encounters:  04/16/13 74   Resp Readings from Last 1 Encounters:  04/16/13 20   Temp Readings from Last 1 Encounters:  04/16/13 97.8 F (36.6 C) Oral    Hemodynamics: CVP:  [12 mmHg-20 mmHg] 19 mmHg  Physical Exam:  Rhythm:   sinus  Breath sounds: Fairly clear  Heart sounds:  RRR  Incisions:  Clean and dry  Abdomen:  Soft, non-distended, non-tender  Extremities:  Warm, well perfused   Intake/Output from previous day: 09/28 0701 - 09/29 0700 In: 787.1 [I.V.:674.6; IV Piggyback:112.5] Out: [id number] [Urine:1511] Intake/Output this shift: Total I/O In: 34.3 [I.V.:21.8; IV Piggyback:12.5] Out: 100 [Urine:100]  Lab Results:  CBC: Recent Labs  04/15/13 0356 04/16/13 0344  WBC 19.6* 13.3*  HGB 9.2* 8.2*  HCT 27.5* 24.5*  PLT 305 188    BMET:  Recent Labs  04/15/13 1638 04/16/13 0344  NA 139 139  K 4.4 4.0  CL 104 103  CO2 23 23  GLUCOSE 96 174*  BUN 49* 50*  CREATININE 2.42* 2.34*  CALCIUM 9.2 9.0     CBG (last 3)   Recent Labs  04/16/13 0051 04/16/13 0355 04/16/13 0729  GLUCAP 119* 128* 140*    ABG    Component Value Date/Time   PHART 7.357 04/16/2013 0520   PCO2ART 41.6 04/16/2013 0520   PO2ART 97.2 04/16/2013 0520   HCO3 22.8 04/16/2013 0520   TCO2 24.1 04/16/2013 0520   ACIDBASEDEF 1.9 04/16/2013 0520   O2SAT 97.1 04/16/2013 0520    CXR: *RADIOLOGY REPORT*  Clinical Data: Aortic valve replacement.  PORTABLE CHEST - 1 VIEW  Comparison: 04/15/2013.  Findings: Trachea is midline. Heart is enlarged, stable. Eight  intact sternotomy wires, unchanged in position. Left subclavian  catheter sheath remains in place. Scattered bilateral air space  disease, some of which is linear in configuration. Overall,  aeration has improved slightly in the interval. Small bilateral  pleural effusions. Lucencies projecting over the inferomedial  aspects of both hemithoraces presumably represent bowel loops.  IMPRESSION:  1. Scattered bilateral air space disease, with improving aeration  at the lung bases. Findings may be due to a combination of mild  edema and atelectasis.  2. Curvilinear lucencies projecting over the anteromedial aspects  of both hemithoraces and may represent bowel loops. Continued  attention on follow-up exams is warranted.  3. Small bilateral pleural effusions.  Original Report Authenticated By: Lorin Picket, M.D.    Transthoracic Echocardiography  Patient: [name], [name] MR #: [id number] Study Date: 04/15/2013 Gender: F Age: 67 Height: 157.5cm Weight: 92.1kg BSA: 1.31m^2 Pt. Status: Room: [facility] ATTENDING Darylene Price MD ORDERING Dahlia Byes SONOGRAPHER Mauricio Po, RDCS, CCT cc:  ------------------------------------------------------------  LV EF: 65% - 70%  ------------------------------------------------------------ Indications: Pericardial effusion 423.9, suspected, pre-procedure.  ------------------------------------------------------------ History: PMH: Coronary artery disease. Congestive heart failure. Aortic valve disease. PMH: Myocardial infarction. Risk factors: Sleep apnea. Current tobacco use. Hypertension. Diabetes  mellitus. Dyslipidemia.  ------------------------------------------------------------ Study Conclusions  - Left ventricle: The cavity size was normal. Wall thickness was normal. Systolic function was vigorous. The estimated ejection fraction was in the range of 65% to 70%. Wall motion was normal; there were no regional wall motion abnormalities. The study is not technically sufficient to allow evaluation of LV diastolic function. - Aortic valve: A pericardial Edwards Magna 23 mm tissue valve is seated well. There is no abnormal motion. No aortic regurgitation or paravalvular leak was seen. There was no stenosis. - Mitral valve: Calcified annulus. No significant regurgitation. - Left atrium: The atrium was mildly dilated. - Tricuspid valve: Mild-moderate regurgitation. - Pericardium, extracardiac: A trivial pericardial effusion was identified. Transthoracic echocardiography. M-mode, limited 2D, limited spectral Doppler, and color Doppler. Height: Height: 157.5cm. Height: 62in. Weight: Weight: 92.1kg. Weight: 202.6lb. Body mass index: BMI: 37.1kg/m^2. Body surface area: BSA: 1.37m^2. Blood pressure: 121/54. Patient status: Inpatient. Location: ICU/CCU  ------------------------------------------------------------  ------------------------------------------------------------ Left ventricle: The cavity size was normal. Wall thickness was normal. Systolic function was vigorous. The estimated ejection fraction was in the range of 65% to 70%. Wall motion was normal; there were no regional wall motion abnormalities. The study is not technically sufficient to allow evaluation of LV diastolic function.  ------------------------------------------------------------ Aortic valve: A pericardial Edwards Magna 23 mm tissue valve is seated well. There is no abnormal motion. No aortic regurgitation or paravalvular leak was seen. Cusp separation was normal. Doppler: There was no  stenosis.  ------------------------------------------------------------ Mitral valve: Calcified annulus. Doppler: No significant regurgitation. Peak gradient: 79mm Hg (D).  ------------------------------------------------------------ Left atrium: The atrium was mildly dilated.  ------------------------------------------------------------ Right ventricle: The cavity size was normal. Wall thickness was normal. Systolic function was normal.  ------------------------------------------------------------ Pulmonic valve: Structurally normal valve. Cusp separation was normal. Doppler: Transvalvular velocity was within the normal range. Mild regurgitation.  ------------------------------------------------------------ Tricuspid valve: Structurally normal valve. Leaflet separation was normal. Doppler: Transvalvular velocity was within the normal range. Mild-moderate regurgitation.  ------------------------------------------------------------ Right atrium: The atrium was normal in size.  ------------------------------------------------------------ Pericardium: A trivial pericardial effusion was identified.  ------------------------------------------------------------  2D measurements Normal Doppler measurements Normal Left ventricle Mitral valve LVID ED, 48.9 mm 43-52 Peak E vel 110 cm/s ------ chord, Peak A vel 57. cm/s ------ PLAX 5 LVID ES, 31.9 mm 23-38 Deceleration 240 ms 150-23 chord, time 0 PLAX Peak 5 mm ------ FS, chord, 35 % >29 gradient, D Hg PLAX Peak E/A 1.9 ------ LVPW, ED 10.4 mm ------ ratio IVS/LVPW 0.78 <1.3 Tricuspid valve ratio, ED Regurg peak 276 cm/s ------ Ventricular septum vel IVS, ED 8.1 mm ------ Peak RV-RA 30 mm ------ LVOT gradient, S Hg Diam, S 15 mm ------ Max regurg 276 cm/s ------ Area 1.77 cm^2 ------ vel Left atrium Pulmonic valve AP dim 41 mm ------ Regurg vel, 122 cm/s ------ AP dim 2.14 cm/m^2 <2.2  ED index  ------------------------------------------------------------ Prepared and Electronically Authenticated by  Ena Dawley, M.D. 2014-09-28T16:33:[id number]    Assessment/Plan: S/P Procedure(s) (LRB): AORTIC VALVE REPLACEMENT (AVR) (N/A) MAZE (N/A) INTRAOPERATIVE TRANSESOPHAGEAL ECHOCARDIOGRAM (N/A)  Overall appears stable/improved   CV - Maintaining NSR w/ stable hemodynamics, post-op ECHO with normal LV systolic function and no pericardial effusion  Continue IV amiodarone for now  Continue coumadin  Resp - resp insufficiency with some acute exacerbation  of chronic diastolic CHF, mild pulm edema, possible aspiration, possible HCAP  Continue empiric Vanc + Zosyn  Continue diuresis  Mobilize  Pulm toilet  Assess swallowing function  Renal - acute on chronic renal insufficiency, creatinine trending down, weight trending down  Continue lasix per Nephrology  Continue cellcept and prograf per Nephrology  Watch I/O's, leave Foley in place for now  Neuro - some intermittent agitation and delirium, likely toxic/metabolic and/or related to medications  Minimize narcotics/sedatives  ID - afebrile, WBC trending down  Continue empiric Vanc + Zosyn (day #1) for possible HCAP  Nutrition - patient reports feeling hungry - check swallowing  DM2 - CBG's under good control  Epistaxis - no further episodes since NG removed and Lovenox stopped    [name] H 04/16/2013 8:33 AM

## 2013-04-16 NOTE — Progress Notes (Signed)
   Subjective:  Mild dyspnea; "sore".   Objective:  Filed Vitals:   04/16/13 0805 04/16/13 0830 04/16/13 0900 04/16/13 0930  BP: 182/69 152/64 149/55 105/90  Pulse: 82 69 70 69  Temp:      TempSrc:      Resp: 18 22 23 20   Height:      Weight:      SpO2: 100% 100% 100% 87%    Intake/Output from previous day:  Intake/Output Summary (Last 24 hours) at 04/16/13 1015 Last data filed at 04/16/13 0900  Gross per 24 hour  Intake 782.89 ml  Output   1546 ml  Net -763.11 ml    Physical Exam: Physical exam: Well-developed well-nourished in no acute distress.  Skin is warm and dry.  HEENT is normal.  Neck - surgical incision site without evidence of infection Chest is clear to auscultation anteriorly Cardiovascular exam RRR; 2/6 systolic murmur, no diastolic murmur Abdominal exam nontender or distended. No masses palpated. Extremities show no edema. neuro grossly intact    Lab Results: Basic Metabolic Panel:  Recent Labs  04/13/13 1600  04/15/13 1638 04/16/13 0344  NA  --   < > 139 139  K  --   < > 4.4 4.0  CL  --   < > 104 103  CO2  --   < > 23 23  GLUCOSE  --   < > 96 174*  BUN  --   < > 49* 50*  CREATININE 1.61*  < > 2.42* 2.34*  CALCIUM  --   < > 9.2 9.0  MG 2.3  --   --   --   < > = values in this interval not displayed. CBC:  Recent Labs  04/15/13 0356 04/16/13 0344  WBC 19.6* 13.3*  NEUTROABS 15.6*  --   HGB 9.2* 8.2*  HCT 27.5* 24.5*  MCV 83.3 82.8  PLT 305 188    Assessment/Plan:  1 severe symptomatic AS- s/p AVR; slow recovery. Will arrange baseline echo post AVR as outpatient.  2 chronic stage III renal failure-patient is status post renal transplant. Nephrology following 3 atrial fibrillation-patient in sinus this AM s/p Maze. Continue amiodarone and coumadin. 4 history of coronary artery disease-resume statin pior to DC. 5 hypertension-Follow 6 Abnormal Chest CT -fu chest CT 3 months 7 Postoperative volume excess; continue  diuresis. [name] 04/16/2013, 10:15 AM

## 2013-04-16 NOTE — Progress Notes (Signed)
[name], female   DOB: 08/05/1945, 67 y.o.   MRN: [id number] EVENING ROUNDS NOTE :     [address]       [place] [address]             [phone]                 4 Days Post-Op Procedure(s) (LRB): AORTIC VALVE REPLACEMENT (AVR) (N/A) MAZE (N/A) INTRAOPERATIVE TRANSESOPHAGEAL ECHOCARDIOGRAM (N/A)  Total Length of Stay:  LOS: 11 days  BP 130/72  Pulse 88  Temp(Src) 98.2 F (36.8 C) (Oral)  Resp 33  Ht 5\' 2"  (1.575 m)  Wt 194 lb 4.8 oz (88.134 kg)  BMI 35.53 kg/m2  SpO2 96%  .Intake/Output     09/29 0701 - 09/30 0700   I.V. (mL/kg) 864.8 (9.8)   IV Piggyback 262.5   Total Intake(mL/kg) 1127.3 (12.8)   Urine (mL/kg/hr) 2000 (1.6)   Total Output 2000   Net -872.7         . amiodarone (NEXTERONE PREMIX) 360 mg/200 mL dextrose 30 mg/hr (04/16/13 2000)  . dextrose 5 % and 0.9% NaCl 50 mL/hr at 04/16/13 1815  . DOPamine 3 mcg/kg/min (04/16/13 2000)     Lab Results  Component Value Date   WBC 13.3* 04/16/2013   HGB 8.2* 04/16/2013   HCT 24.5* 04/16/2013   PLT 188 04/16/2013   GLUCOSE 174* 04/16/2013   CHOL 164 04/06/2013   TRIG 125 04/06/2013   HDL 73 04/06/2013   LDLDIRECT 89.3 06/13/2006   LDLCALC 66 04/06/2013   ALT 97* 04/15/2013   AST 72* 04/15/2013   NA 139 04/16/2013   K 4.0 04/16/2013   CL 103 04/16/2013   CREATININE 2.34* 04/16/2013   BUN 50* 04/16/2013   CO2 23 04/16/2013   TSH 1.76 05/25/2012   INR 1.62* 04/16/2013   HGBA1C 6.9* 04/07/2013   MICROALBUR 168.6* 10/31/2006   Less confused tonight Prn iv labetalol for increased bp   Grace Isaac MD  Beeper [id number] Office [phone] 04/16/2013 8:59 PM

## 2013-04-16 NOTE — Evaluation (Addendum)
Clinical/Bedside Swallow Evaluation Patient Details  Name: [name] MRN: [id number] Date of Birth: 1945/11/10  Today's Date: 04/16/2013 Time: 1110-1130 SLP Time Calculation (min): 20 min  Past Medical History:  Past Medical History  Diagnosis Date  . Coronary artery disease 05/2002    a. Ant MI 2003 s/p PTCA/stent to RCA.   Marland Kitchen Hypertension   . Hyperlipidemia   . CHF (congestive heart failure)   . Pericardial effusion     a. Small by echo 11/2011.  Marland Kitchen GERD (gastroesophageal reflux disease)   . 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 Unspecified hearing loss   . Anemia, iron deficiency     of chronic disease  . Helicobacter pylori (H. pylori) infection     hx of  . Esophagitis, reflux   . Gout   . Cholelithiasis   . Hx of colonic polyps   . Diverticulosis of colon   . 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  Marland Kitchen Chronic cough onset 03/2010    Dr Melvyn Novas  . Carotid artery disease     a. Carotid Dopplers performed in August 2013 showed 40-59% left stenosis and 0-39% right; f/u recommended in 2 years.   . Aortic stenosis     a. Severe AS by echo 11/2012.  . Asystole     a. During ENT surgery 2005: developed marked asystole requiring CPR, felt due to vagal reaction (cath nonobst dz).  . Myocardial infarction 2003  . Cerebrovascular accident 2009    a. LMCA infarct felt embolic 123XX123, maintained on chronic coumadin.; denies residual on 04/05/2013  . Sleep apnea     Pt says testing was positive, intolerant of CPAP.  Marland Kitchen Type II diabetes mellitus   . ESRD (end stage renal disease)     a. Mass on L kidney per pt s/p nephrectomy - pt states not cancer - [facility] notes indicate ESRD due to HTN/DM - was previously on HD. b. Kidney transplant 02/2011.  . S/P kidney transplant 03/16/2011  . Chronic Persistent Atrial  Fibrillation 12/31/2008    Qualifier: Diagnosis of  By: [name]    . Nodule of right lung 04/07/2013    Ground glass opacity right lung  . S/P aortic valve replacement with bioprosthetic valve and maze procedure 04/12/2013    27mm [facility] Ease bovine pericardial tissue valve   . S/P Maze operation for atrial fibrillation 04/12/2013    Complete bilateral atrial lesion set using bipolar radiofrequency and cryothermy ablation with clipping of LA appendage   Past Surgical History:  Past Surgical History  Procedure Laterality Date  . Cholecystectomy  2009  . Arteriovenous graft placement Left   . Av fistula placement Right   . Nasal reconstruction with septal repair      "took it out" (04/05/2013)  . Tubal ligation    . Nephrectomy Left 2010    no CA on bx  . Kidney transplant  03/16/11  . Cardioversion  05/29/2012    Procedure: CARDIOVERSION;  Surgeon: Lelon Perla, MD;  Location[facility] ENDOSCOPY;  Service: Cardiovascular;  Laterality: N/A;  . Coronary angioplasty with stent placement Right     coronary artery  . Tonsillectomy    . Total abdominal hysterectomy    . Arteriovenous graft placement Left     "I've had 2 on my left; had one removed" (  04/05/2013)   . Av fistula repair Right     "took it out" ((/18/2014)  . Insertion of dialysis catheter Bilateral     "over the years; took them both out" (04/05/2013)  . Intraoperative transesophageal echocardiogram N/A 04/11/2013    Procedure: INTRAOPERATIVE TRANSESOPHAGEAL ECHOCARDIOGRAM;  Surgeon: Rexene Alberts, MD;  Location: [facility];  Service: Open Heart Surgery;  Laterality: N/A;  . 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 She is here for cath prior to further evaluation of her AS. She was evaluated by Dr. [name] in late July for increasing dsypnea on exertion. She began to feel like she was having asthma attacks. 2D echo from 11/2012 was consistent with severe AS.  She presented for planned admission for hydration prior to surgery & neprology eval. On 9/25 she underwent AVR and MAZE procedure.  Intubated 9/24 for procedure, extubated same day, re-intubated for procedure 925 for second procedure.  CXR revealed scattered bilateral air space disease, with improving aeration at the lung bases. Findings may be due to a combination of mild edema and atelectasis.  Pt. denied dysphagia following CVA 2009, however chart documentation notes family report of difficulty swalllowing after stroke.   Assessment / Plan / Recommendation Clinical Impression  Pt. is lethargic sleeping in chair and required mod-max verbal and visual cues to stay awake.  Mildly delayed oral phase due to lethargy and decreased endurance.  Delayed cough at end of session suspect from prior thin liquid trials.  Pt. is not safe to initiate po's from clinical observation alone given today's findings paired with recent heart surgery, intubation history CVA.  Recommend NPO except for meds in applesauce and objective assessment tomorrow. with MBS if pt. able (nasal bleeding during NGT placement, therefore FEES may not be optimal at this time).  SLP will view pt./ speak with RN tomorrow morning to determine appropriateness and ability to participate in an MBS.    Aspiration Risk  Moderate    Diet Recommendation NPO;NPO except meds (meds whole in applesauce)   Medication Administration: Whole meds with puree    Other  Recommendations Oral Care Recommendations:  Oral care BID   Follow Up Recommendations  Inpatient Rehab    Frequency and Duration min 2x/week  2 weeks   Pertinent Vitals/Pain No indications    SLP Swallow Goals Goal #3: Pt. will maintain alertness consistently to participate in objective swallow assessment with occasional min verbal cues.   Swallow Study         Oral/Motor/Sensory Function Overall Oral Motor/Sensory Function:  (generalized weakness due to lethargy and decr endurance)   Ice Chips Ice chips: Within functional limits   Thin Liquid Thin Liquid: Impaired Presentation: Cup;Spoon;Straw Pharyngeal  Phase Impairments: Suspected delayed Swallow;Cough - Delayed    Nectar Thick Nectar Thick Liquid: Not tested   Honey Thick Honey Thick Liquid: Not tested   Puree Puree: Impaired Pharyngeal Phase Impairments: Cough - Delayed (likely due to thin water)   Solid   GO  Solid: Impaired Oral Phase Functional Implications:  (delayed oral prep)       Cranford Mon.Ed [employer] [phone]  04/16/2013

## 2013-04-17 ENCOUNTER — Inpatient Hospital Stay (HOSPITAL_COMMUNITY): Payer: Medicare Other

## 2013-04-17 ENCOUNTER — Encounter (HOSPITAL_COMMUNITY): Payer: Self-pay | Admitting: Thoracic Surgery (Cardiothoracic Vascular Surgery)

## 2013-04-17 LAB — RENAL FUNCTION PANEL
Albumin: 2.5 g/dL — ABNORMAL LOW (ref 3.5–5.2)
BUN: 45 mg/dL — ABNORMAL HIGH (ref 6–23)
CO2: 24 mEq/L (ref 19–32)
Calcium: 8.6 mg/dL (ref 8.4–10.5)
Creatinine, Ser: 2.14 mg/dL — ABNORMAL HIGH (ref 0.50–1.10)
Glucose, Bld: 202 mg/dL — ABNORMAL HIGH (ref 70–99)

## 2013-04-17 LAB — BASIC METABOLIC PANEL
BUN: 47 mg/dL — ABNORMAL HIGH (ref 6–23)
Creatinine, Ser: 2.05 mg/dL — ABNORMAL HIGH (ref 0.50–1.10)
GFR calc Af Amer: 27 mL/min — ABNORMAL LOW (ref >90)
GFR calc non Af Amer: 24 mL/min — ABNORMAL LOW (ref >90)
Potassium: 3.1 mEq/L — ABNORMAL LOW (ref 3.5–5.1)

## 2013-04-17 LAB — CBC
HCT: 24.9 % — ABNORMAL LOW (ref 36.0–46.0)
MCH: 27.7 pg (ref 26.0–34.0)
MCHC: 33.7 g/dL (ref 30.0–36.0)
MCV: 82.2 fL (ref 78.0–100.0)
Platelets: 231 10*3/uL (ref 150–400)
RDW: 16 % — ABNORMAL HIGH (ref 11.5–15.5)

## 2013-04-17 LAB — GLUCOSE, CAPILLARY
Glucose-Capillary: 183 mg/dL — ABNORMAL HIGH (ref 70–99)
Glucose-Capillary: 186 mg/dL — ABNORMAL HIGH (ref 70–99)

## 2013-04-17 LAB — PROTIME-INR: INR: 1.86 — ABNORMAL HIGH (ref 0.00–1.49)

## 2013-04-17 MED ORDER — INSULIN ASPART 100 UNIT/ML ~~LOC~~ SOLN: 0.0000 [IU] | Freq: Every day | SUBCUTANEOUS | Status: DC

## 2013-04-17 MED ORDER — INSULIN ASPART 100 UNIT/ML ~~LOC~~ SOLN
0.0000 [IU] | Freq: Three times a day (TID) | SUBCUTANEOUS | Status: DC
  Administered 2013-04-18 (×2): 3 [IU] via SUBCUTANEOUS
  Administered 2013-04-19: 4 [IU] via SUBCUTANEOUS
  Administered 2013-04-20: 3 [IU] via SUBCUTANEOUS
  Administered 2013-04-20: 17:00:00 via SUBCUTANEOUS
  Administered 2013-04-20: 3 [IU] via SUBCUTANEOUS
  Administered 2013-04-21: 4 [IU] via SUBCUTANEOUS
  Administered 2013-04-21: 3 [IU] via SUBCUTANEOUS
  Administered 2013-04-21: 7 [IU] via SUBCUTANEOUS
  Administered 2013-04-22: 3 [IU] via SUBCUTANEOUS
  Administered 2013-04-22: 7 [IU] via SUBCUTANEOUS
  Administered 2013-04-23 (×2): 4 [IU] via SUBCUTANEOUS

## 2013-04-17 MED ORDER — METOPROLOL TARTRATE 12.5 MG HALF TABLET
12.5000 mg | ORAL_TABLET | Freq: Two times a day (BID) | ORAL | Status: DC
  Administered 2013-04-17 (×2): 12.5 mg via ORAL
  Filled 2013-04-17 (×4): qty 1

## 2013-04-17 MED ORDER — POTASSIUM CHLORIDE 10 MEQ/50ML IV SOLN
10.0000 meq | INTRAVENOUS | Status: AC
  Administered 2013-04-17 (×3): 10 meq via INTRAVENOUS
  Filled 2013-04-17 (×3): qty 50

## 2013-04-17 MED ORDER — AMIODARONE HCL 200 MG PO TABS
400.0000 mg | ORAL_TABLET | Freq: Two times a day (BID) | ORAL | Status: DC
  Administered 2013-04-17 – 2013-04-19 (×4): 400 mg via ORAL
  Filled 2013-04-17 (×6): qty 2

## 2013-04-17 NOTE — Progress Notes (Signed)
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 Pt with severe aortic stenosis now s/p AVR on 9/24.     PT Comments   Pt progressing with mobility and cognition today. Pt able to recall 2 precautions and demonstrates increased control and safety with mobility. RN present throughout to assist with IV. Will continue to follow.    Follow Up Recommendations  CIR;Supervision/Assistance - 24 hour     Does the patient have the potential to tolerate intense rehabilitation     Barriers to Discharge        Equipment Recommendations       Recommendations for Other Services    Frequency     Progress towards PT Goals Progress towards PT goals: Progressing toward goals  Plan Current plan remains appropriate    Precautions / Restrictions Precautions Precautions: Sternal;Fall Precaution Comments: pt reeducated for sternal precautions with teach back and still only able to recall 2 end of session Restrictions Weight Bearing Restrictions: No (sternal precautions)   Pertinent Vitals/Pain 7/10 sternal incision pain, RN aware HR 90-103 with afib end of session sats 95% on 4L    Mobility  Bed Mobility Bed Mobility: Not assessed Transfers Sit to Stand: 4: Min assist;From chair/3-in-1 Stand to Sit: 4: Min assist;To chair/3-in-1 Details for Transfer Assistance: cueing for sequence, safety, hand placement  Ambulation/Gait Ambulation/Gait Assistance: 4: Min assist Ambulation Distance (Feet): 200  Feet Assistive device: Rolling walker Ambulation/Gait Assistance Details: cueing for posture, position in RW and looking up. pt with increased control of gait today but continues to require assist with directional changes Gait Pattern: Step-through pattern;Decreased stride length;Trunk flexed Gait velocity: decreased Stairs: No    Exercises General Exercises - Lower Extremity Long Arc Quad: AROM;Both;15 reps;Seated;Strengthening Hip ABduction/ADduction: AROM;Both;Supine;15 reps;Strengthening Hip Flexion/Marching: AROM;Both;15 reps;Seated;Strengthening   PT Diagnosis:    PT Problem List:   PT Treatment Interventions:     PT Goals (current goals can now be found in the care plan section)    Visit Information  Last PT Received On: 04/17/13 Assistance Needed: +1 History of Present Illness: [name] is a 67 y/o F with history of CAD s/p MI/RCA stent 2005, aortic stenosis, 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 Pt with severe aortic stenosis now s/p AVR on 9/24.      Subjective Data      Cognition  Cognition Arousal/Alertness: Awake/alert Behavior During Therapy: Flat affect Overall Cognitive Status: Impaired/Different from baseline Area of Impairment: Orientation Orientation Level: Time Memory: Decreased recall of precautions;Decreased short-term memory Problem Solving: Requires verbal cues;Requires tactile cues    Balance     End of Session PT - End of Session Equipment Utilized During Treatment: Gait belt;Oxygen Activity Tolerance: Patient tolerated treatment well Patient left: in chair;with call bell/phone within reach;with nursing/sitter in room Nurse Communication: Mobility status   GP  [name] 04/17/2013, 11:11 AM Elwyn Reach, [place]

## 2013-04-17 NOTE — Progress Notes (Addendum)
      [address]       [place] [address]             [phone]      5 Days Post-Op Procedure(s) (LRB): AORTIC VALVE REPLACEMENT (AVR) (N/A) MAZE (N/A) INTRAOPERATIVE TRANSESOPHAGEAL ECHOCARDIOGRAM (N/A) Subjective:  Ms. Bragan is without new complaints this morning.  She is participating with PT during exam.  The ambulation with assistance.   Objective: Vital signs in last 24 hours: Temp:  [97.6 F (36.4 C)-98.4 F (36.9 C)] 98.4 F (36.9 C) (09/30 0400) Pulse Rate:  [63-96] 85 (09/30 0630) Cardiac Rhythm:  [-] Normal sinus rhythm (09/30 0400) Resp:  [17-33] 28 (09/30 0630) BP: (99-182)/(46-136) 134/69 mmHg (09/30 0630) SpO2:  [87 %-100 %] 100 % (09/30 0630) FiO2 (%):  [50 %] 50 % (09/29 0800) Weight:  [191 lb 9.3 oz (86.9 kg)] 191 lb 9.3 oz (86.9 kg) (09/30 0500)  Intake/Output from previous day: 09/29 0701 - 09/30 0700 In: 2110.7 [I.V.:1648.2; IV Piggyback:462.5] Out: 3625 [Urine:3625]  General appearance: alert, cooperative and no distress Heart: regular rate and rhythm Lungs: clear to auscultation bilaterally Abdomen: soft, non-tender; bowel sounds normal; no masses,  no organomegaly Extremities: edema trace Wound: clean and dry  Lab Results:  Recent Labs  04/16/13 0344 04/17/13 0400  WBC 13.3* 10.2  HGB 8.2* 8.4*  HCT 24.5* 24.9*  PLT 188 231   BMET:  Recent Labs  04/16/13 0344 04/17/13 0400  NA 139 141  K 4.0 3.1*  CL 103 103  CO2 23 25  GLUCOSE 174* 178*  BUN 50* 47*  CREATININE 2.34* 2.05*  CALCIUM 9.0 8.8    PT/INR:  Recent Labs  04/17/13 0400  LABPROT 20.9*  INR 1.86*   ABG    Component Value Date/Time   PHART 7.357 04/16/2013 0520   HCO3 22.8 04/16/2013 0520   TCO2 24.1 04/16/2013 0520   ACIDBASEDEF 1.9 04/16/2013 0520   O2SAT 97.1 04/16/2013 0520   CBG (last 3)   Recent Labs  04/16/13 1938 04/16/13 2343 04/17/13 0323  GLUCAP 147* 126* 132*    Assessment/Plan: S/P Procedure(s) (LRB): AORTIC  VALVE REPLACEMENT (AVR) (N/A) MAZE (N/A) INTRAOPERATIVE TRANSESOPHAGEAL ECHOCARDIOGRAM (N/A)  1. CV-H/O Atrial Fibrillation, NSR this morning- on IV Amiodarone, Lopressor 2. Pulm- chronic diastolic CHF, bibasilar atelectasis, pulmonary edema stable, continue pulm toilet, wean oxygen as tolerated 3. Renal- Acute on Chronic Renal Insufficiency- creatinine trending down, remains volume overloaded continue Lasix, Nephrology following 4. ID- Possible HCAP, remains afebrile, leukocytosis resolving, continue Vanc, Zosyn 5. Dysphagia- per S/S keep patient NPO, patient not as lethargic this morning, S/S to re-evaluate today 6. INR 1.86 will continue Coumadin 7. Expected Blood Loss Anemia- stable 8. Dispo- continue current care   LOS: 12 days    BARRETT, ERIN 04/17/2013  I have seen and examined the patient and agree with the assessment and plan as outlined.  Making slow but steady progress.  Back into Afib w/ controlled rate this morning.  Renal function returning towards baseline.  Await MBS for swallowing eval - hopefully can increase oral intake today.  Wean dopamine off.  Convert amiodarone to oral once taking po's.  Narissa Beaufort H 04/17/2013 8:42 AM

## 2013-04-17 NOTE — Procedures (Signed)
Objective Swallowing Evaluation: Modified Barium Swallowing Study  Patient Details  Name: [name] MRN: [id number] Date of Birth: [birth year]  Today's Date: 04/17/2013 Time: [id number] SLP Time Calculation (min): 20 min  Past Medical History:  Past Medical History  Diagnosis Date  . Coronary artery disease 05/2002    a. Ant MI 2003 s/p PTCA/stent to RCA.   Marland Kitchen Hypertension   . Hyperlipidemia   . CHF (congestive heart failure)   . Pericardial effusion     a. Small by echo 11/2011.  Marland Kitchen GERD (gastroesophageal reflux disease)   . 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 Unspecified hearing loss   . Anemia, iron deficiency     of chronic disease  . Helicobacter pylori (H. pylori) infection     hx of  . Esophagitis, reflux   . Gout   . Cholelithiasis   . Hx of colonic polyps   . Diverticulosis of colon   . 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  Marland Kitchen Chronic cough onset 03/2010    Dr Melvyn Novas  . Carotid artery disease     a. Carotid Dopplers performed in August 2013 showed 40-59% left stenosis and 0-39% right; f/u recommended in 2 years.   . Aortic stenosis     a. Severe AS by echo 11/2012.  . Asystole     a. During ENT surgery 2005: developed marked asystole requiring CPR, felt due to vagal reaction (cath nonobst dz).  . Myocardial infarction 2003  . Cerebrovascular accident 2009    a. LMCA infarct felt embolic [id number], maintained on chronic coumadin.; denies residual on 04/05/2013  . Sleep apnea     Pt says testing was positive, intolerant of CPAP.  Marland Kitchen Type II diabetes mellitus   . ESRD (end stage renal disease)     a. Mass on L kidney per pt s/p nephrectomy - pt states not cancer - [facility] notes indicate ESRD due to HTN/DM - was previously on HD. b. Kidney transplant 02/2011.  . S/P kidney transplant 03/16/2011   . Chronic Persistent Atrial Fibrillation 12/31/2008    Qualifier: Diagnosis of  By: [name]    . Nodule of right lung 04/07/2013    Ground glass opacity right lung  . S/P aortic valve replacement with bioprosthetic valve and maze procedure 04/12/2013    80mm [facility] bovine pericardial tissue valve   . S/P Maze operation for atrial fibrillation 04/12/2013    Complete bilateral atrial lesion set using bipolar radiofrequency and cryothermy ablation with clipping of LA appendage   Past Surgical History:  Past Surgical History  Procedure Laterality Date  . Cholecystectomy  2009  . Arteriovenous graft placement Left   . Av fistula placement Right   . Nasal reconstruction with septal repair      "took it out" (04/05/2013)  . Tubal ligation    . Nephrectomy Left 2010    no CA on bx  . Kidney transplant  03/16/11  . Cardioversion  05/29/2012    Procedure: CARDIOVERSION;  Surgeon: Lelon Perla, MD;  Location: [facility] ENDOSCOPY;  Service: Cardiovascular;  Laterality: N/A;  . Coronary angioplasty with stent placement Right     coronary artery  . Tonsillectomy    . Total abdominal hysterectomy    . Arteriovenous graft placement Left     "I've had 2 on  my left; had one removed" (04/05/2013)   . Av fistula repair Right     "took it out" ((/18/2014)  . Insertion of dialysis catheter Bilateral     "over the years; took them both out" (04/05/2013)  . Intraoperative transesophageal echocardiogram N/A 04/11/2013    Procedure: INTRAOPERATIVE TRANSESOPHAGEAL ECHOCARDIOGRAM;  Surgeon: Rexene Alberts, MD;  Location: [place];  Service: Open Heart Surgery;  Laterality: N/A;  . Artery exploration Right 04/11/2013    Procedure: ARTERY EXPLORATION;  Surgeon: Rexene Alberts, MD;  Location: [place];  Service: Open Heart Surgery;  Laterality: Right;  Right carotid artery exploration  . Aortic valve replacement N/A 04/12/2013    Procedure: AORTIC VALVE REPLACEMENT (AVR);  Surgeon: Rexene Alberts, MD;   Location: [place];  Service: Open Heart Surgery;  Laterality: N/A;  . Maze N/A 04/12/2013    Procedure: MAZE;  Surgeon: Rexene Alberts, MD;  Location: [place];  Service: Open Heart Surgery;  Laterality: N/A;  . 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 She is here for cath prior to further evaluation of her AS. She was evaluated by Dr. [name] in late July for increasing dsypnea on exertion. She began to feel like she was having asthma attacks. 2D echo from 11/2012 was consistent with severe AS.  She presented for planned admission for hydration prior to surgery & neprology eval. On 9/25 she underwent AVR and MAZE procedure.  CXR revealed scattered bilateral air space disease, with improving aeration at the lung bases. Findings may be due to a combination of mild edema and atelectasis.  Pt. denied dysphagia following CVA 2009, however chart documentation notes family report of difficulty swalllowing after stroke.  MBS recommended after BSE.     Assessment / Plan / Recommendation Clinical Impression  Dysphagia Diagnosis: Within Functional Limits Clinical impression: Pt. alert during MBS accompanied by RN.  Oral and pharyngeal phases of swallow were within functional limits.  Mastication was mildly delayed but appropriate.  Swallow initiation was timely without pharyngeal residue.  One instance of trace aspiration of thin barium while swallowing barium pill due to decreased cohesion and  coordination with 2 varying textures simultaneously in oral cavity.  Brief scan of esophagus did not reveal abnormalities (no radiologist present to confirm).  Recommend regular diet texture and thin liquids, pills whole in applesauce.  Brief ST f/u for continued tolerance.     Treatment Recommendation  Therapy as outlined in treatment plan below    Diet Recommendation Regular;Thin liquid   Liquid Administration via: Cup;Straw Medication Administration: Whole meds with puree Supervision: Patient able to self feed;Intermittent supervision to cue for compensatory strategies Compensations: Slow rate;Small sips/bites Postural Changes and/or Swallow Maneuvers: Seated upright 90 degrees    Other  Recommendations Oral Care Recommendations: Oral care BID   Follow Up Recommendations  Inpatient Rehab    Frequency and Duration min 2x/week  2 weeks   Pertinent Vitals/Pain none    SLP Swallow Goals Patient will utilize recommended strategies during swallow to increase  swallowing safety with: Modified independent assistance Swallow Study Goal #3 - Progress: Met      Reason for Referral Objectively evaluate swallowing function   Oral Phase Oral Preparation/Oral Phase Oral Phase: WFL   Pharyngeal Phase Pharyngeal Phase Pharyngeal Phase: Within functional limits Pharyngeal - Solids Pharyngeal - Pill: Penetration/Aspiration during swallow (aspirated thin barium) Penetration/Aspiration details (pill): Material enters airway, passes BELOW cords without attempt by patient to eject out (silent aspiration)  Cervical Esophageal Phase        Cervical Esophageal Phase Cervical Esophageal Phase: [facility]         [place] Mon[employer] [phone]  04/17/2013

## 2013-04-17 NOTE — Progress Notes (Signed)
TCTS BRIEF SICU PROGRESS NOTE  5 Days Post-Op  S/P Procedure(s) (LRB): AORTIC VALVE REPLACEMENT (AVR) (N/A) MAZE (N/A) INTRAOPERATIVE TRANSESOPHAGEAL ECHOCARDIOGRAM (N/A)   Did will with MBS Tolerating regular carb-modified diet Back in sinus rhythm, stable off of dopamine Breathing comfortably on room air Diuresing some  Plan: Will convert amiodarone to oral  Payge Eppes H 04/17/2013 2:28 PM

## 2013-04-17 NOTE — Progress Notes (Signed)
Speech Language Pathology  Patient Details Name: [name] MRN: [id number] DOB: [birth year] Today's Date: 04/17/2013 Time:  -    MBS scheduled for today at 11:00  Orbie Pyo Higgins M.Ed [employer] [phone]  04/17/2013

## 2013-04-17 NOTE — Progress Notes (Signed)
[facility] ROUNDING NOTE   Subjective:   Interval History: appears improved  Objective:  Vital signs in last 24 hours:  Temp:  [97.6 F (36.4 C)-98.4 F (36.9 C)] 98.4 F (36.9 C) (09/30 0931) Pulse Rate:  [63-96] 93 (09/30 0800) Resp:  [17-34] 34 (09/30 0800) BP: (102-171)/(46-136) 113/53 mmHg (09/30 0800) SpO2:  [93 %-100 %] 100 % (09/30 0800) Weight:  [86.9 kg (191 lb 9.3 oz)] 86.9 kg (191 lb 9.3 oz) (09/30 0500)  Weight change: -1.234 kg (-2 lb 11.5 oz) Filed Weights   04/15/13 0500 04/16/13 0600 04/17/13 0500  Weight: 92.08 kg (203 lb) 88.134 kg (194 lb 4.8 oz) 86.9 kg (191 lb 9.3 oz)    Intake/Output: I/O last 3 completed shifts: In: 2534.8 [I.V.:2009.8; IV Piggyback:525] Out: [id number] [Urine:4700]   Intake/Output this shift:  Total I/O In: 141.9 [I.V.:141.9] Out: 70 [Urine:70]  CVS- RRR RS- CTA decreased  ABD- BS present soft non-distended EXT- no edema   Basic Metabolic Panel:  Recent Labs Lab 04/12/13 0400  04/12/13 1800  04/13/13 0330 04/13/13 1600  04/14/13 0400 04/15/13 0356 04/15/13 1638 04/16/13 0344 04/17/13 0400  NA 142  < >  --   < > 140  --   < > 144 137 139 139 141  K 4.4  < >  --   < > 4.6  --   < > 5.1 5.1 4.4 4.0 3.1*  CL 105  --   --   < > 108  --   < > 107 101 104 103 103  CO2 26  --   --   --  19  --   --  23 23 23 23 25   GLUCOSE 140*  < >  --   < > 111*  --   < > 112* 82 96 174* 178*  BUN 23  --   --   < > 22  --   < > 32* 44* 49* 50* 47*  CREATININE 1.26*  --  1.13*  < > 1.33* 1.61*  < > 1.82* 2.17* 2.42* 2.34* 2.05*  CALCIUM 8.7  --   --   --  8.1*  --   --  9.0 9.2 9.2 9.0 8.8  MG  --   --  2.9*  --  2.5 2.3  --   --   --   --   --   --   < > = values in this interval not displayed.  Liver Function Tests:  Recent Labs Lab 04/15/13 0356  AST 72*  ALT 97*  ALKPHOS 111  BILITOT 0.3  PROT 6.1  ALBUMIN 2.8*   No results found for this basename: LIPASE, AMYLASE,  in the last 168 hours No results found for  this basename: AMMONIA,  in the last 168 hours  CBC:  Recent Labs Lab 04/13/13 1600 04/13/13 1643 04/14/13 0400 04/15/13 0356 04/16/13 0344 04/17/13 0400  WBC 19.0*  --  19.3* 19.6* 13.3* 10.2  NEUTROABS  --   --   --  15.6*  --   --   HGB 9.5* 10.5* 9.8* 9.2* 8.2* 8.4*  HCT 28.7* 31.0* 29.3* 27.5* 24.5* 24.9*  MCV 83.9  --  83.7 83.3 82.8 82.2  PLT 144*  --  151 305 188 231    Cardiac Enzymes: No results found for this basename: CKTOTAL, CKMB, CKMBINDEX, TROPONINI,  in the last 168 hours  BNP: No components found with this basename: POCBNP,   CBG:  Recent Labs Lab 04/16/13 1540 04/16/13 1938 04/16/13 2343 04/17/13 0323 04/17/13 0816  GLUCAP 148* 147* 126* 132* 134*    Microbiology: Results for orders placed during the hospital encounter of 04/05/13  URINE CULTURE     Status: None   Collection Time    04/07/13  5:53 AM      Result Value Range Status   Specimen Description URINE, CLEAN CATCH   Final   Special Requests NONE   Final   Culture  Setup Time     Final   Value: 04/07/2013 15:27     Performed at [facility]     Final   Value: >=100,000 COLONIES/ML     Performed at [facility]   Culture     Final   Value: Multiple bacterial morphotypes present, none predominant. Suggest appropriate recollection if clinically indicated.     Performed at [facility]   Report Status 04/08/2013 FINAL   Final  SURGICAL PCR SCREEN     Status: None   Collection Time    04/07/13  4:45 PM      Result Value Range Status   MRSA, PCR NEGATIVE  NEGATIVE Final   Staphylococcus aureus NEGATIVE  NEGATIVE Final   Comment:            The Xpert SA Assay (FDA     approved for NASAL specimens     in patients over 45 years of age),     is one component of     a comprehensive surveillance     program.  Test performance has     been validated by [employer] for patients greater     than or equal to 73 year old.     It is not intended      to diagnose infection nor to     guide or monitor treatment.  URINE CULTURE     Status: None   Collection Time    04/14/13  9:57 PM      Result Value Range Status   Specimen Description URINE, CATHETERIZED   Final   Special Requests Immunocompromised   Final   Culture  Setup Time     Final   Value: 04/15/2013 03:26     Performed at [facility]     Final   Value: NO GROWTH     Performed at [facility]   Culture     Final   Value: NO GROWTH     Performed at [facility]   Report Status 04/16/2013 FINAL   Final  CULTURE, BLOOD (SINGLE)     Status: None   Collection Time    04/14/13 10:38 PM      Result Value Range Status   Specimen Description BLOOD RIGHT HAND   Final   Special Requests BOTTLES DRAWN AEROBIC AND ANAEROBIC 2CC   Final   Culture  Setup Time     Final   Value: 04/15/2013 02:58     Performed at [facility]   Culture     Final   Value:        BLOOD CULTURE RECEIVED NO GROWTH TO DATE CULTURE WILL BE HELD FOR 5 DAYS BEFORE ISSUING A FINAL NEGATIVE REPORT     Performed at [facility]   Report Status PENDING   Incomplete  CULTURE, RESPIRATORY (NON-EXPECTORATED)     Status: None  Collection Time    04/15/13  1:30 PM      Result Value Range Status   Specimen Description TRACHEAL ASPIRATE   Final   Special Requests Immunocompromised   Final   Gram Stain     Final   Value: RARE WBC PRESENT, PREDOMINANTLY PMN     NO SQUAMOUS EPITHELIAL CELLS SEEN     NO ORGANISMS SEEN     Performed at [facility]   Culture     Final   Value: NO GROWTH 1 DAY     Performed at [facility]   Report Status PENDING   Incomplete    Coagulation Studies:  Recent Labs  04/15/13 0356 04/16/13 0344 04/17/13 0400  LABPROT 17.8* 18.8* 20.9*  INR 1.51* 1.62* 1.86*    Urinalysis:  Recent Labs  04/14/13 2157  COLORURINE YELLOW  LABSPEC 1.018  PHURINE 5.0  GLUCOSEU NEGATIVE  HGBUR NEGATIVE   BILIRUBINUR NEGATIVE  KETONESUR NEGATIVE  PROTEINUR NEGATIVE  UROBILINOGEN 0.2  NITRITE NEGATIVE  LEUKOCYTESUR SMALL*      Imaging: Ct Abdomen Pelvis Wo Contrast  04/15/2013   CLINICAL DATA:  Patient complaining of abdominal pain on the right side. Patient has clinical evidence of sepsis. Evaluate for possible source of sepsis. History of a renal transplant and aortic valve replacement.  EXAM: CT ABDOMEN AND PELVIS WITHOUT CONTRAST  TECHNIQUE: Multidetector CT imaging of the abdomen and pelvis was performed following the standard protocol without intravenous contrast.  COMPARISON:  11/21/2008  FINDINGS: There are small effusions and significant dependent lung base opacity, greater on the right. This may all be atelectasis. Infiltrate should be considered likely in the proper clinical setting. The heart is moderately enlarged. There are dense coronary artery calcifications and changes from the aortic valve replacement.  The left kidney has been removed. The right kidney is atrophic. Small areas of low attenuation are noted in within the right kidney that are likely cysts. They are stable.  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 It is normal in size attenuation with no masses. There is no hydronephrosis.  Normal liver and spleen. The gallbladder is surgically absent. No bile duct dilation. The pancreas is unremarkable.  There is thickening of the right adrenal gland likely hyperplasia. This is stable.  There are no pathologically enlarged lymph nodes. There are no abnormal fluid collections or ascites.  There is ectasia throughout the abdominal aorta. Dense atherosclerotic calcifications are noted along the aorta the iliac arteries and the smaller branch vessels.  There are few sigmoid colon diverticula. The colon is otherwise unremarkable. The small bowel is within normal limits. There is no evidence of ischemic bowel.  There are areas of hyperattenuation in the subcutaneous fat  in the left lower quadrant. This is nonspecific. They may be related to previous subcutaneous injections reflecting areas of hemorrhage.  There is a small fat containing umbilical hernia.  Degenerative changes are noted throughout the spine. There are no osteoblastic or osteolytic lesions.  Diffuse subcutaneous edema is noted along the anterolateral aspect of the lower abdomen and pelvis.  IMPRESSION: 1. Dependent lung base opacity, right greater than left, associated with small effusions. This may all be atelectasis, but given the symptoms of sepsis, pneumonia should be considered. 2. There is no convincing source of infection below the diaphragm. There is no evidence of ischemic bowel. No abscess is seen. 3. There are multiple chronic findings as detailed   Electronically Signed   By: Lajean Manes  On: 04/15/2013 15:06   Dg Chest Port 1 View  04/17/2013   CLINICAL DATA:  Followup CHF and atelectasis  EXAM: PORTABLE CHEST - 1 VIEW  COMPARISON:  04/16/2013  FINDINGS: Congestive heart failure and mild edema are unchanged.  Increase in bibasilar atelectasis. Probable small bilateral pleural effusions.  IMPRESSION: Congestive heart failure with edema is unchanged.  Progression of bibasilar atelectasis.   Electronically Signed   By: Franchot Gallo M.D.   On: 04/17/2013 06:52   Dg Chest Port 1 View  04/16/2013   *RADIOLOGY REPORT*  Clinical Data: Aortic valve replacement.  PORTABLE CHEST - 1 VIEW  Comparison: 04/15/2013.  Findings: Trachea is midline.  Heart is enlarged, stable.  Eight intact sternotomy wires, unchanged in position. Left subclavian catheter sheath remains in place.  Scattered bilateral air space disease, some of which is linear in configuration.  Overall, aeration has improved slightly in the interval.  Small bilateral pleural effusions.  Lucencies projecting over the inferomedial aspects of both hemithoraces presumably represent bowel loops.  IMPRESSION:  1.  Scattered bilateral air space  disease, with improving aeration at the lung bases.  Findings may be due to a combination of mild edema and atelectasis. 2.  Curvilinear lucencies projecting over the anteromedial aspects of both hemithoraces and may represent bowel loops.  Continued attention on follow-up exams is warranted. 3.  Small bilateral pleural effusions.   Original Report Authenticated By: Lorin Picket, M.D.     Medications:   . amiodarone (NEXTERONE PREMIX) 360 mg/200 mL dextrose 30 mg/hr (04/17/13 1000)  . dextrose 5 % and 0.9% NaCl 50 mL/hr at 04/16/13 1815  . DOPamine 2 mcg/kg/min (04/17/13 0900)   . antiseptic oral rinse  15 mL Mouth Rinse BID  . bisacodyl  10 mg Oral Daily   Or  . bisacodyl  10 mg Rectal Daily  . cinacalcet  60 mg Oral Q48H  . dapsone  25 mg Oral Daily  . docusate sodium  200 mg Oral Daily  . furosemide  80 mg Intravenous TID  . insulin aspart  0-24 Units Subcutaneous Q4H  . metoprolol tartrate  12.5 mg Oral BID  . mycophenolate  500 mg Oral BID  . pantoprazole (PROTONIX) IV  40 mg Intravenous Q24H  . piperacillin-tazobactam (ZOSYN)  IV  3.375 g Intravenous Q8H  . potassium chloride  10 mEq Intravenous Q1 Hr x 3  . sodium bicarbonate  50 mEq Intravenous Once  . sodium chloride  3 mL Intravenous Q12H  . tacrolimus  5 mg Oral BID  . vancomycin  1,000 mg Intravenous Q24H  . warfarin  2.5 mg Oral q1800  . Warfarin - Physician Dosing Inpatient   Does not apply q1800   diphenhydrAMINE, fentaNYL, haloperidol lactate, labetalol, ondansetron (ZOFRAN) IV, sodium chloride  Assessment/ Plan:  1. Acute on chronic kidney disease: patient with Cr trended back towards baseline following heart cath early in hospitalization and had been stable until the day of her operation. Since then her Cr is improving Weight down likely indicating improved volume status. This acute worsening . - will continue to trend Cr on daily renal panel  - patient does not seem to have indications for emergent dialysis at  this time  - will continue diuresis to lasix 80 mg IV TID as she has responded well to this and has improved renal function  - avoid nephrotoxic agents  2. 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 Had only been on  prograf here. Most recent prograf level was 5.4 in early September.  - will continue prograf to liquid formulation 5 mg BID  - will continue cellcept liquid formulation 500 mg BID    LOS: 12 [name] @TODAY @10 :30 AM

## 2013-04-17 NOTE — Progress Notes (Signed)
Thank you for consult on Ms. Danielson. Chart reviewed and note that respiratory status is improving. Therapies ongoing and patient is making good progress. She is min assist overall and anticipate that she'll continue to progress with therapies in next few days. Notes indicate that husband and daughter to assist past discharge. Will have CM follow for progress and defer formal consult for now.

## 2013-04-17 NOTE — Progress Notes (Signed)
Patient SPo2 probe is located on patient's LEFT Ear, cannot pick up sats on her fingers.  Patient prefers to sleep on Left side, therefore continuous pulse ox monitoring is not accurate, will do hourly pulse-ox to make sure patient is maintaining her sats on RA, so far patient has maintained sats above 97%.  Patient is neuro intact, follows commands, BP/HR stable

## 2013-04-17 NOTE — Consult Note (Signed)
Physical Medicine and Rehabilitation Consult Reason for Consult: Deconditioning Referring Physician:  Dr. Roxy Manns   HPI: [name] is a 67 y.o. female with history of HTN, renal transplant, CAD with persistent A Fib, aortic stenosis; who was admitted on 04/05/13 for hydration prior to AVR with maze procedure on 04/12/13 by Dr. Roxy Manns as well as repair of right carotid artery by Dr. Trula Slade. Post op with respiratory failure due to fluid overload requiring diuresis. In NSR on amiodarone and lopressor. Started on Vanc and zosyn due to concerns of PNA. MBS done to evaluate swallow and regular diet with compensatory strategies recommended. Today weakness due diarrhea last pm.  Therapies ongoing and patient noted to be deconditioned as well as dyspneic with activity. MD, PT OT recommending CIR.    Patient has had loose stools that ended today. She tried getting up with therapy today but had to go back to room for bowel movement   Review of Systems  Constitutional: Positive for malaise/fatigue.  HENT: Negative for hearing loss.   Eyes: Negative for blurred vision and double vision.  Respiratory: Positive for shortness of breath.   Cardiovascular: Negative for chest pain and palpitations.  Gastrointestinal: Positive for diarrhea.       Hems flare up.  Genitourinary: Positive for urgency and frequency.  Neurological: Positive for weakness. Negative for headaches.  Psychiatric/Behavioral: The patient is nervous/anxious.    Past Medical History  Diagnosis Date  . Coronary artery disease 05/2002    a. Ant MI 2003 s/p PTCA/stent to RCA.   Marland Kitchen Hypertension   . Hyperlipidemia   . CHF (congestive heart failure)   . Pericardial effusion     a. Small by echo 11/2011.  Marland Kitchen GERD (gastroesophageal reflux disease)   . 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 Unspecified hearing loss   . Anemia, iron deficiency     of chronic disease  . Helicobacter pylori (H. pylori) infection     hx of   . Esophagitis, reflux   . Gout   . Cholelithiasis   . Hx of colonic polyps   . Diverticulosis of colon   . 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  Marland Kitchen Chronic cough onset 03/2010    Dr Melvyn Novas  . Carotid artery disease     a. Carotid Dopplers performed in August 2013 showed 40-59% left stenosis and 0-39% right; f/u recommended in 2 years.   . Aortic stenosis     a. Severe AS by echo 11/2012.  . Asystole     a. During ENT surgery 2005: developed marked asystole requiring CPR, felt due to vagal reaction (cath nonobst dz).  . Myocardial infarction 2003  . Cerebrovascular accident 2009    a. LMCA infarct felt embolic [id number], maintained on chronic coumadin.; denies residual on 04/05/2013  . Sleep apnea     Pt says testing was positive, intolerant of CPAP.  Marland Kitchen Type II diabetes mellitus   . ESRD (end stage renal disease)     a. Mass on L kidney per pt s/p nephrectomy - pt states not cancer - [facility] notes indicate ESRD due to HTN/DM - was previously on HD. b. Kidney transplant 02/2011.  . S/P kidney transplant 03/16/2011  . Chronic Persistent Atrial Fibrillation 12/31/2008    Qualifier: Diagnosis of  By: [name]    . Nodule of  right lung 04/07/2013    Ground glass opacity right lung  . S/P aortic valve replacement with bioprosthetic valve and maze procedure 04/12/2013    50mm [facility] Ease bovine pericardial tissue valve   . S/P Maze operation for atrial fibrillation 04/12/2013    Complete bilateral atrial lesion set using bipolar radiofrequency and cryothermy ablation with clipping of LA appendage  . Pruritic dermatitis     treated with steriods/UV light   Past Surgical History  Procedure Laterality Date  . Cholecystectomy  2009  . Arteriovenous graft placement Left   . Av fistula placement Right   . Nasal reconstruction with  septal repair      "took it out" (04/05/2013)  . Tubal ligation    . Nephrectomy Left 2010    no CA on bx  . Kidney transplant  03/16/11  . Cardioversion  05/29/2012    Procedure: CARDIOVERSION;  Surgeon: Lelon Perla, MD;  Location[facility] ENDOSCOPY;  Service: Cardiovascular;  Laterality: N/A;  . Coronary angioplasty with stent placement Right     coronary artery  . Tonsillectomy    . Total abdominal hysterectomy    . Arteriovenous graft placement Left     "I've had 2 on my left; had one removed" (04/05/2013)   . Av fistula repair Right     "took it out" ((/18/2014)  . Insertion of dialysis catheter Bilateral     "over the years; took them both out" (04/05/2013)  . Intraoperative transesophageal echocardiogram N/A 04/11/2013    Procedure: INTRAOPERATIVE TRANSESOPHAGEAL ECHOCARDIOGRAM;  Surgeon: Rexene Alberts, MD;  Location: Home;  Service: Open Heart Surgery;  Laterality: N/A;  . Artery exploration Right 04/11/2013    Procedure: ARTERY EXPLORATION;  Surgeon: Rexene Alberts, MD;  Location: [place];  Service: Open Heart Surgery;  Laterality: Right;  Right carotid artery exploration  . Aortic valve replacement N/A 04/12/2013    Procedure: AORTIC VALVE REPLACEMENT (AVR);  Surgeon: Rexene Alberts, MD;  Location: [place];  Service: Open Heart Surgery;  Laterality: N/A;  . Maze N/A 04/12/2013    Procedure: MAZE;  Surgeon: Rexene Alberts, MD;  Location: [place];  Service: Open Heart Surgery;  Laterality: N/A;  . Intraoperative transesophageal echocardiogram N/A 04/12/2013    Procedure: INTRAOPERATIVE TRANSESOPHAGEAL ECHOCARDIOGRAM;  Surgeon: Rexene Alberts, MD;  Location: [place];  Service: Open Heart Surgery;  Laterality: N/A;   Family History  Problem Relation Age of Onset  . Stroke Father   . Diabetes Other   . Hypertension Mother    Social History:  Married. Independent PTA--occasionally used cane. Husband supportive and does most of housework/cooking. She reports that she quit smoking about 11  years ago. Her smoking use included Cigarettes. She has a 30 pack-year smoking history. She has never used smokeless tobacco. She reports that she drinks a glass of wine occasionally. She does not use illicit drugs.   Allergies  Allergen Reactions  . Ibuprofen Nausea And Vomiting  . Sulfamethoxazole-Trimethoprim Swelling  . Sulfonamide Derivatives Swelling  . Tape Rash  . Tramadol Nausea And Vomiting  . Bactrim Swelling  . Red Dye Itching and Rash   Medications Prior to Admission  Medication Sig Dispense Refill  . cinacalcet (SENSIPAR) 60 MG tablet Take 60 mg by mouth every other day.      . dapsone 25 MG tablet Take 25 mg by mouth daily.       . diphenhydrAMINE (BENADRYL) 50 MG capsule Take 1 capsule (50 mg total)  by mouth every 6 (six) hours as needed for itching.  90 capsule  3  . enoxaparin (LOVENOX) 80 MG/0.8ML injection Inject 0.8 mLs (80 mg total) into the skin every 12 (twelve) hours.  20 Syringe  0  . Flurandrenolide 4 MCG/SQCM TAPE Apply topically daily.      . furosemide (LASIX) 20 MG tablet Take 1 tablet by mouth daily.      Marland Kitchen glipiZIDE (GLUCOTROL XL) 10 MG 24 hr tablet Take 10 mg by mouth 2 (two) times daily.      . Magnesium 400 MG CAPS Take 800 mg by mouth daily.      . metoprolol tartrate (LOPRESSOR) 25 MG tablet Take 25 mg by mouth 2 (two) times daily.      . mycophenolate (MYFORTIC) 180 MG EC tablet Take 360 mg by mouth 2 (two) times daily.       Marland Kitchen nystatin (MYCOSTATIN) 100000 UNIT/ML suspension Take 500,000 Units by mouth daily. Swish and Spit 5 ml as directed      . pravastatin (PRAVACHOL) 40 MG tablet Take 40 mg by mouth daily. Take one tablet Mon, Wed, and Friday      . tacrolimus (PROGRAF) 1 MG capsule Take 5 mg by mouth 2 (two) times daily. Take 5 capsules in the am, and 5 capsules in the pm.      . triamcinolone cream (KENALOG) 0.1 % Apply 1 application topically 2 (two) times daily.       Marland Kitchen warfarin (COUMADIN) 5 MG tablet Take 5-7.5 mg by mouth daily. Take 5 mg  on Monday, Wednesday, Thursday, Friday, Saturday, and Sunday.  Take 7.5 mg on Tuesday.        Home: Home Living Family/patient expects to be discharged to:: Private residence Living Arrangements: Spouse/significant other Available Help at Discharge: Family;Available 24 hours/day Type of Home: House Home Access: Stairs to enter [employer] of Steps: 1 Home Layout: Two level;Bed/bath upstairs Alternate Level Stairs-Number of Steps: flight Alternate Level Stairs-Rails: Right Home Equipment: Cane - single point  Functional History:   Functional Status:  Mobility: Bed Mobility Bed Mobility: Not assessed Sit to Sidelying Left: 3: Mod assist;HOB flat Transfers Transfers: Sit to Stand;Stand to Sit Sit to Stand: 4: Min assist;From chair/3-in-1 Sit to Stand: Patient Percentage: 70% Stand to Sit: 3: Mod assist;To chair/3-in-1 Stand to Sit: Patient Percentage: 40% Ambulation/Gait Ambulation/Gait Assistance: 4: Min assist Ambulation/Gait: Patient Percentage: 80% Ambulation Distance (Feet): 200 Feet Assistive device: Rolling walker Ambulation/Gait Assistance Details: cueing for posture, position in RW and looking up. pt with increased control of gait today but continues to require assist with directional changes Gait Pattern: Step-through pattern;Decreased stride length;Trunk flexed Gait velocity: decreased Stairs: No Wheelchair Mobility Wheelchair Mobility: No  ADL: ADL Grooming: Performed;Wash/dry hands;Wash/dry face;Min guard Where Assessed - Grooming: Supported standing Upper Body Bathing: Simulated;Moderate assistance Where Assessed - Upper Body Bathing: Unsupported sitting Lower Body Bathing: Simulated;Maximal assistance Where Assessed - Lower Body Bathing: Supported sit to stand Upper Body Dressing: Simulated;Moderate assistance Where Assessed - Upper Body Dressing: Unsupported sitting Lower Body Dressing: Simulated;Maximal assistance Where Assessed - Lower Body  Dressing: Supported sit to stand Toilet Transfer: Simulated;Moderate assistance Toilet Transfer Method: Sit to Loss adjuster, chartered:  Building services engineer) Equipment Used: Gait belt;Rolling walker Transfers/Ambulation Related to ADLs: Min assist with RW to ambulate to sink. Pt tends to lean heavilly on RW through bil UEs as she fatigues. ADL Comments: Pt quickly fatigued after performing grooming at sink and requesting to return to chair.  Cognition:  Cognition Overall Cognitive Status: Impaired/Different from baseline Orientation Level: Oriented to person;Oriented to place;Oriented to time;Oriented to situation Cognition Arousal/Alertness: Awake/alert Behavior During Therapy: Flat affect Overall Cognitive Status: Impaired/Different from baseline Area of Impairment: Orientation Orientation Level: Time Memory: Decreased recall of precautions;Decreased short-term memory Safety/Judgement: Decreased awareness of safety Problem Solving: Requires verbal cues;Requires tactile cues Difficult to assess due to: Level of arousal  Blood pressure 129/79, pulse 66, temperature 98.6 F (37 C), temperature source Oral, resp. rate 18, height 5\' 2"  (1.575 m), weight 90.2 kg (198 lb 13.7 oz), SpO2 95.00%.   Physical Exam  Nursing note and vitals reviewed. Constitutional: She is oriented to person, place, and time. She appears well-developed and well-nourished.  Dyspneic with conversation.   HENT:  Head: Normocephalic.  Eyes: Pupils are equal, round, and reactive to light.  Neck: Normal range of motion. Neck supple.  Pulmonary/Chest: Effort normal. No respiratory distress. She has wheezes.  Abdominal: Soft. She exhibits no distension. Bowel sounds are increased. There is no tenderness.  Musculoskeletal: She exhibits edema (LUE).  Neurological: She is alert and oriented to person, place, and time.  Skin: Skin is warm and dry.  Dry  patches bilateral LE.   Psychiatric: Her speech is normal and  behavior is normal. Her mood appears anxious. Cognition and memory are normal.    Results for orders placed during the hospital encounter of 04/05/13 (from the past 24 hour(s))  GLUCOSE, CAPILLARY     Status: Abnormal   Collection Time    04/18/13  4:49 PM      Result Value Range   Glucose-Capillary 69 (*) 70 - 99 mg/dL   Comment 1 Documented in Chart     Comment 2 Notify RN    GLUCOSE, CAPILLARY     Status: None   Collection Time    04/18/13  6:08 PM      Result Value Range   Glucose-Capillary 91  70 - 99 mg/dL  GLUCOSE, CAPILLARY     Status: None   Collection Time    04/18/13  9:58 PM      Result Value Range   Glucose-Capillary 85  70 - 99 mg/dL  PROTIME-INR     Status: Abnormal   Collection Time    04/19/13  7:00 AM      Result Value Range   Prothrombin Time 25.9 (*) 11.6 - 15.2 seconds   INR 2.47 (*) 0.00 - 99991111  BASIC METABOLIC PANEL     Status: Abnormal   Collection Time    04/19/13  7:00 AM      Result Value Range   Sodium 140  135 - 145 mEq/L   Potassium 3.3 (*) 3.5 - 5.1 mEq/L   Chloride 102  96 - 112 mEq/L   CO2 23  19 - 32 mEq/L   Glucose, Bld 101 (*) 70 - 99 mg/dL   BUN 46 (*) 6 - 23 mg/dL   Creatinine, Ser 2.44 (*) 0.50 - 1.10 mg/dL   Calcium 8.7  8.4 - 10.5 mg/dL   GFR calc non Af Amer 19 (*) >90 mL/min   GFR calc Af Amer 22 (*) >90 mL/min  CBC     Status: Abnormal   Collection Time    04/19/13  7:00 AM      Result Value Range   WBC 15.6 (*) 4.0 - 10.5 K/uL   RBC 3.43 (*) 3.87 - 5.11 MIL/uL   Hemoglobin 9.5 (*) 12.0 - 15.0 g/dL   HCT 28.2 (*)  36.0 - 46.0 %   MCV 82.2  78.0 - 100.0 fL   MCH 27.7  26.0 - 34.0 pg   MCHC 33.7  30.0 - 36.0 g/dL   RDW 16.5 (*) 11.5 - 15.5 %   Platelets 344  150 - 400 K/uL  GLUCOSE, CAPILLARY     Status: Abnormal   Collection Time    04/19/13 11:28 AM      Result Value Range   Glucose-Capillary 204 (*) 70 - 99 mg/dL   Dg Chest Port 1 View  04/18/2013   CLINICAL DATA:  Aortic stenosis. Status post aortic valve  replacement.  EXAM: PORTABLE CHEST - 1 VIEW  COMPARISON:  04/17/2013  FINDINGS: Persistent cardiomegaly. Improved aeration at both lung bases with diminished atelectasis and effusions. No pneumothorax.  IMPRESSION: Improving atelectasis and small effusions. Pulmonary vascularity is now normal.   Electronically Signed   By: Rozetta Nunnery   On: 04/18/2013 07:22    Assessment/Plan: Diagnosis: Deconditioning after aortic valve replacement postoperative day #7 1. Does the need for close, 24 hr/day medical supervision in concert with the patient's rehab needs make it unreasonable for this patient to be served in a less intensive setting? Potentially 2. Co-Morbidities requiring supervision/potential complications: Diabetes type 2, anemia, history of renal transplant 3. Due to bladder management, bowel management, safety, skin/wound care, disease management, medication administration, pain management and patient education, does the patient require 24 hr/day rehab nursing? Potentially 4. Does the patient require coordinated care of a physician, rehab nurse, PT (1-2 hrs/day, 5 days/week) and OT (1-2 hrs/day, 5 days/week) to address physical and functional deficits in the context of the above medical diagnosis(es)? Potentially Addressing deficits in the following areas: balance, endurance, locomotion, strength, transferring, bowel/bladder control, bathing, dressing, feeding and toileting 5. Can the patient actively participate in an intensive therapy program of at least 3 hrs of therapy per day at least 5 days per week? Yes 6. The potential for patient to make measurable gains while on inpatient rehab is good 7. Anticipated functional outcomes upon discharge from inpatient rehab are modified independent mobility with PT, modified independent ADLs with OT, not applicable with SLP. 8. Estimated rehab length of stay to reach the above functional goals is: 7 days 9. Does the patient have adequate social supports to  accommodate these discharge functional goals? Potentially 10. Anticipated D/C setting: Home 11. Anticipated post D/C treatments: [facility] therapy 12. Overall Rehab/Functional Prognosis: excellent  RECOMMENDATIONS: This patient's condition is appropriate for continued rehabilitative care in the following setting: Patient could benefit from CIR however in another 2-3 days may become too high level. Patient plans to go home with husband and daughter assisting. Patient has agreed to participate in recommended program. No Note that insurance prior authorization may be required for reimbursement for recommended care.  Comment: Patient plans to go home Monday no matter what, daughter will be here then.    04/19/2013

## 2013-04-17 NOTE — Progress Notes (Signed)
   Subjective:  "Weak"; denies dyspnea.   Objective:  Filed Vitals:   04/17/13 0500 04/17/13 0530 04/17/13 0600 04/17/13 0630  BP: 149/72 164/74 126/71 134/69  Pulse: 92 96 76 85  Temp:      TempSrc:      Resp: 22 20 28 28   Height:      Weight: 191 lb 9.3 oz (86.9 kg)     SpO2: 100% 99% 98% 100%    Intake/Output from previous day:  Intake/Output Summary (Last 24 hours) at 04/17/13 0748 Last data filed at 04/17/13 0700  Gross per 24 hour  Intake 2110.71 ml  Output   3625 ml  Net -1514.29 ml    Physical Exam: Physical exam: Well-developed well-nourished in no acute distress.  Skin is warm and dry.  HEENT is normal.  Neck - surgical incision site without evidence of infection Chest mild basilar crackles Cardiovascular exam irregular; 2/6 systolic murmur, no diastolic murmur Abdominal exam nontender or distended. No masses palpated. Extremities show no edema. neuro grossly intact    Lab Results: Basic Metabolic Panel:  Recent Labs  04/16/13 0344 04/17/13 0400  NA 139 141  K 4.0 3.1*  CL 103 103  CO2 23 25  GLUCOSE 174* 178*  BUN 50* 47*  CREATININE 2.34* 2.05*  CALCIUM 9.0 8.8   CBC:  Recent Labs  04/15/13 0356 04/16/13 0344 04/17/13 0400  WBC 19.6* 13.3* 10.2  NEUTROABS 15.6*  --   --   HGB 9.2* 8.2* 8.4*  HCT 27.5* 24.5* 24.9*  MCV 83.3 82.8 82.2  PLT 305 188 231    Assessment/Plan:  1 severe symptomatic AS- s/p AVR; slow recovery but improving 2 chronic stage III renal failure-patient is status post renal transplant. Nephrology following; renal function improving this AM. 3 atrial fibrillation-patient in atrial fibrillation this AM. Continue amiodarone (transition to po) and coumadin. Add low dose lopressor. If she remains in atrial fibrillation following DC can arrange outpt DCCV once fully loaded with amiodarone. 4 history of coronary artery disease-resume statin pior to DC. 5 hypertension-Follow 6 Abnormal Chest CT -fu chest CT 3  months 7 Postoperative volume excess; continue diuresis. 8 Hypokalemia - supplement [name] 04/17/2013, 7:48 AM

## 2013-04-18 ENCOUNTER — Inpatient Hospital Stay (HOSPITAL_COMMUNITY): Payer: Medicare Other

## 2013-04-18 LAB — CBC
HCT: 24.1 % — ABNORMAL LOW (ref 36.0–46.0)
MCH: 27.5 pg (ref 26.0–34.0)
Platelets: 386 10*3/uL (ref 150–400)
RBC: 3.05 MIL/uL — ABNORMAL LOW (ref 3.87–5.11)
RDW: 16.2 % — ABNORMAL HIGH (ref 11.5–15.5)
WBC: 12.2 10*3/uL — ABNORMAL HIGH (ref 4.0–10.5)

## 2013-04-18 LAB — BASIC METABOLIC PANEL
CO2: 22 mEq/L (ref 19–32)
Chloride: 102 mEq/L (ref 96–112)
GFR calc Af Amer: 26 mL/min — ABNORMAL LOW (ref >90)
Potassium: 3.7 mEq/L (ref 3.5–5.1)

## 2013-04-18 LAB — GLUCOSE, CAPILLARY
Glucose-Capillary: 69 mg/dL — ABNORMAL LOW (ref 70–99)
Glucose-Capillary: 91 mg/dL (ref 70–99)
Glucose-Capillary: 85 mg/dL (ref 70–99)

## 2013-04-18 LAB — CULTURE, RESPIRATORY W GRAM STAIN: Culture: NO GROWTH

## 2013-04-18 LAB — PROTIME-INR: INR: 2.23 — ABNORMAL HIGH (ref 0.00–1.49)

## 2013-04-18 MED ORDER — INSULIN DETEMIR 100 UNIT/ML ~~LOC~~ SOLN
20.0000 [IU] | Freq: Two times a day (BID) | SUBCUTANEOUS | Status: DC
  Administered 2013-04-18 (×2): 20 [IU] via SUBCUTANEOUS
  Filled 2013-04-18 (×3): qty 0.2

## 2013-04-18 MED ORDER — FUROSEMIDE 10 MG/ML IJ SOLN
160.0000 mg | Freq: Three times a day (TID) | INTRAVENOUS | Status: DC
  Filled 2013-04-18 (×2): qty 16

## 2013-04-18 MED ORDER — SODIUM CHLORIDE 0.9 % IV SOLN: 250.0000 mL | INTRAVENOUS | Status: DC | PRN

## 2013-04-18 MED ORDER — FUROSEMIDE 40 MG PO TABS
40.0000 mg | ORAL_TABLET | Freq: Two times a day (BID) | ORAL | Status: DC
  Filled 2013-04-18 (×2): qty 1

## 2013-04-18 MED ORDER — MOVING RIGHT ALONG BOOK
Freq: Once | Status: AC
  Administered 2013-04-18: 09:00:00
  Filled 2013-04-18: qty 1

## 2013-04-18 MED ORDER — FUROSEMIDE 40 MG PO TABS
40.0000 mg | ORAL_TABLET | Freq: Two times a day (BID) | ORAL | Status: DC
  Administered 2013-04-18: 40 mg via ORAL
  Filled 2013-04-18 (×3): qty 1

## 2013-04-18 MED ORDER — SODIUM CHLORIDE 0.9 % IJ SOLN: 3.0000 mL | INTRAMUSCULAR | Status: DC | PRN

## 2013-04-18 MED ORDER — SODIUM CHLORIDE 0.9 % IJ SOLN
3.0000 mL | Freq: Two times a day (BID) | INTRAMUSCULAR | Status: DC
  Administered 2013-04-19: 11:00:00 via INTRAVENOUS
  Administered 2013-04-21 – 2013-04-22 (×3): 3 mL via INTRAVENOUS
  Administered 2013-04-23: 12:00:00 via INTRAVENOUS

## 2013-04-18 MED ORDER — METOPROLOL TARTRATE 25 MG PO TABS
25.0000 mg | ORAL_TABLET | Freq: Two times a day (BID) | ORAL | Status: DC
  Administered 2013-04-18 – 2013-04-23 (×11): 25 mg via ORAL
  Filled 2013-04-18 (×12): qty 1

## 2013-04-18 MED ORDER — FUROSEMIDE 10 MG/ML IJ SOLN
80.0000 mg | Freq: Three times a day (TID) | INTRAMUSCULAR | Status: AC
  Administered 2013-04-18 – 2013-04-19 (×3): 80 mg via INTRAVENOUS

## 2013-04-18 MED ORDER — POTASSIUM CHLORIDE CRYS ER 20 MEQ PO TBCR
20.0000 meq | EXTENDED_RELEASE_TABLET | Freq: Two times a day (BID) | ORAL | Status: DC
  Administered 2013-04-18 – 2013-04-23 (×10): 20 meq via ORAL
  Filled 2013-04-18 (×14): qty 1

## 2013-04-18 NOTE — Progress Notes (Signed)
[facility] ROUNDING NOTE   Subjective:   Interval History: appears more comfortable but still dyspneic to conversation  Objective:  Vital signs in last 24 hours:  Temp:  [97.5 F (36.4 C)-98.1 F (36.7 C)] 98.1 F (36.7 C) (10/01 0740) Pulse Rate:  [34-118] 79 (10/01 1000) Resp:  [16-31] 26 (10/01 1000) BP: (105-156)/(39-88) 153/76 mmHg (10/01 0900) SpO2:  [77 %-100 %] 97 % (10/01 1000) Weight:  [87.9 kg (193 lb 12.6 oz)] 87.9 kg (193 lb 12.6 oz) (10/01 0500)  Weight change: 1 kg (2 lb 3.3 oz) Filed Weights   04/16/13 0600 04/17/13 0500 04/18/13 0500  Weight: 88.134 kg (194 lb 4.8 oz) 86.9 kg (191 lb 9.3 oz) 87.9 kg (193 lb 12.6 oz)    Intake/Output: I/O last 3 completed shifts: In: 3348.9 [P.O.:540; I.V.:2258.9; IV Piggyback:550] Out: 2685 [Urine:2685]   Intake/Output this shift:  Total I/O In: 100 [I.V.:100] Out: 80 [Urine:80]  CVS- RRR  RS- CTA decreased rales up tp mid back ABD- BS present soft non-distended  EXT- no edema   Basic Metabolic Panel:  Recent Labs Lab 04/12/13 0400  04/12/13 1800  04/13/13 0330 04/13/13 1600  04/15/13 1638 04/16/13 0344 04/17/13 0400 04/17/13 1400 04/18/13 0420  NA 142  < >  --   < > 140  --   < > 139 139 141 139 140  K 4.4  < >  --   < > 4.6  --   < > 4.4 4.0 3.1* 3.5 3.7  CL 105  --   --   < > 108  --   < > 104 103 103 103 102  CO2 26  --   --   --  19  --   < > 23 23 25 24 22   GLUCOSE 140*  < >  --   < > 111*  --   < > 96 174* 178* 202* 176*  BUN 23  --   --   < > 22  --   < > 49* 50* 47* 45* 45*  CREATININE 1.26*  --  1.13*  < > 1.33* 1.61*  < > 2.42* 2.34* 2.05* 2.14* 2.12*  CALCIUM 8.7  --   --   --  8.1*  --   < > 9.2 9.0 8.8 8.6 8.5  MG  --   --  2.9*  --  2.5 2.3  --   --   --   --   --   --   PHOS  --   --   --   --   --   --   --   --   --   --  2.2*  --   < > = values in this interval not displayed.  Liver Function Tests:  Recent Labs Lab 04/15/13 0356 04/17/13 1400  AST 72*  --    ALT 97*  --   ALKPHOS 111  --   BILITOT 0.3  --   PROT 6.1  --   ALBUMIN 2.8* 2.5*   No results found for this basename: LIPASE, AMYLASE,  in the last 168 hours No results found for this basename: AMMONIA,  in the last 168 hours  CBC:  Recent Labs Lab 04/14/13 0400 04/15/13 0356 04/16/13 0344 04/17/13 0400 04/18/13 0420  WBC 19.3* 19.6* 13.3* 10.2 12.2*  NEUTROABS  --  15.6*  --   --   --   HGB 9.8* 9.2* 8.2* 8.4* 8.4*  HCT  29.3* 27.5* 24.5* 24.9* 24.1*  MCV 83.7 83.3 82.8 82.2 79.0  PLT 151 305 188 231 386    Cardiac Enzymes: No results found for this basename: CKTOTAL, CKMB, CKMBINDEX, TROPONINI,  in the last 168 hours  BNP: No components found with this basename: POCBNP,   CBG:  Recent Labs Lab 04/17/13 0816 04/17/13 1239 04/17/13 1550 04/17/13 2141 04/18/13 0747  GLUCAP 134* 112* 183* 186* 151*    Microbiology: Results for orders placed during the hospital encounter of 04/05/13  URINE CULTURE     Status: None   Collection Time    04/07/13  5:53 AM      Result Value Range Status   Specimen Description URINE, CLEAN CATCH   Final   Special Requests NONE   Final   Culture  Setup Time     Final   Value: 04/07/2013 15:27     Performed at [facility]     Final   Value: >=100,000 COLONIES/ML     Performed at [facility]   Culture     Final   Value: Multiple bacterial morphotypes present, none predominant. Suggest appropriate recollection if clinically indicated.     Performed at [facility]   Report Status 04/08/2013 FINAL   Final  SURGICAL PCR SCREEN     Status: None   Collection Time    04/07/13  4:45 PM      Result Value Range Status   MRSA, PCR NEGATIVE  NEGATIVE Final   Staphylococcus aureus NEGATIVE  NEGATIVE Final   Comment:            The Xpert SA Assay (FDA     approved for NASAL specimens     in patients over 48 years of age),     is one component of     a comprehensive surveillance      program.  Test performance has     been validated by [employer] for patients greater     than or equal to 55 year old.     It is not intended     to diagnose infection nor to     guide or monitor treatment.  URINE CULTURE     Status: None   Collection Time    04/14/13  9:57 PM      Result Value Range Status   Specimen Description URINE, CATHETERIZED   Final   Special Requests Immunocompromised   Final   Culture  Setup Time     Final   Value: 04/15/2013 03:26     Performed at [place]     Final   Value: NO GROWTH     Performed at [facility]   Culture     Final   Value: NO GROWTH     Performed at [facility]   Report Status 04/16/2013 FINAL   Final  CULTURE, BLOOD (SINGLE)     Status: None   Collection Time    04/14/13 10:38 PM      Result Value Range Status   Specimen Description BLOOD RIGHT HAND   Final   Special Requests BOTTLES DRAWN AEROBIC AND ANAEROBIC 2CC   Final   Culture  Setup Time     Final   Value: 04/15/2013 02:58     Performed at [facility]   Culture     Final   Value:  BLOOD CULTURE RECEIVED NO GROWTH TO DATE CULTURE WILL BE HELD FOR 5 DAYS BEFORE ISSUING A FINAL NEGATIVE REPORT     Performed at [facility]   Report Status PENDING   Incomplete  CULTURE, RESPIRATORY (NON-EXPECTORATED)     Status: None   Collection Time    04/15/13  1:30 PM      Result Value Range Status   Specimen Description TRACHEAL ASPIRATE   Final   Special Requests Immunocompromised   Final   Gram Stain     Final   Value: RARE WBC PRESENT, PREDOMINANTLY PMN     NO SQUAMOUS EPITHELIAL CELLS SEEN     NO ORGANISMS SEEN     Performed at [facility]   Culture     Final   Value: NO GROWTH 2 DAYS     Performed at [facility]   Report Status 04/18/2013 FINAL   Final    Coagulation Studies:  Recent Labs  04/16/13 0344 04/17/13 0400 04/18/13 0420  LABPROT 18.8* 20.9* 24.0*  INR 1.62* 1.86*  2.23*    Urinalysis: No results found for this basename: COLORURINE, APPERANCEUR, LABSPEC, PHURINE, GLUCOSEU, HGBUR, BILIRUBINUR, KETONESUR, PROTEINUR, UROBILINOGEN, NITRITE, LEUKOCYTESUR,  in the last 72 hours    Imaging: Dg Chest Port 1 View  04/18/2013   CLINICAL DATA:  Aortic stenosis. Status post aortic valve replacement.  EXAM: PORTABLE CHEST - 1 VIEW  COMPARISON:  04/17/2013  FINDINGS: Persistent cardiomegaly. Improved aeration at both lung bases with diminished atelectasis and effusions. No pneumothorax.  IMPRESSION: Improving atelectasis and small effusions. Pulmonary vascularity is now normal.   Electronically Signed   By: Rozetta Nunnery   On: 04/18/2013 07:22   Dg Chest Port 1 View  04/17/2013   CLINICAL DATA:  Followup CHF and atelectasis  EXAM: PORTABLE CHEST - 1 VIEW  COMPARISON:  04/16/2013  FINDINGS: Congestive heart failure and mild edema are unchanged.  Increase in bibasilar atelectasis. Probable small bilateral pleural effusions.  IMPRESSION: Congestive heart failure with edema is unchanged.  Progression of bibasilar atelectasis.   Electronically Signed   By: Franchot Gallo M.D.   On: 04/17/2013 06:52     Medications:     . amiodarone  400 mg Oral BID PC  . bisacodyl  10 mg Oral Daily   Or  . bisacodyl  10 mg Rectal Daily  . cinacalcet  60 mg Oral Q48H  . dapsone  25 mg Oral Daily  . docusate sodium  200 mg Oral Daily  . furosemide  40 mg Oral BID  . insulin aspart  0-20 Units Subcutaneous TID WC  . insulin aspart  0-5 Units Subcutaneous QHS  . insulin detemir  20 Units Subcutaneous BID  . metoprolol tartrate  25 mg Oral BID  . mycophenolate  500 mg Oral BID  . pantoprazole (PROTONIX) IV  40 mg Intravenous Q24H  . piperacillin-tazobactam (ZOSYN)  IV  3.375 g Intravenous Q8H  . potassium chloride  20 mEq Oral BID  . sodium bicarbonate  50 mEq Intravenous Once  . sodium chloride  3 mL Intravenous Q12H  . sodium chloride  3 mL Intravenous Q12H  . tacrolimus  5 mg  Oral BID  . vancomycin  1,000 mg Intravenous Q24H  . warfarin  2.5 mg Oral q1800  . Warfarin - Physician Dosing Inpatient   Does not apply q1800   sodium chloride, diphenhydrAMINE, fentaNYL, labetalol, ondansetron (ZOFRAN) IV, sodium chloride, sodium chloride  Assessment/ Plan:  Marland Kitchen Acute on  chronic kidney disease: patient with Cr trended back towards baseline following heart cath early in hospitalization and had been stable until the day of her operation. Since then her Cr is improving Weight actually increased slightly- will continue to trend Cr on daily renal panel  - patient does not seem to have indications for emergent dialysis at this time  - will increase lasix 160mg  q 8hours - avoid nephrotoxic agents  2. 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 Had only been on prograf here. Most recent prograf level was 5.4 in early September.  - will continue prograf to liquid formulation 5 mg BID  - will continue cellcept liquid formulation 500 mg BID      LOS: 13 Aminata [place] W @TODAY @11 :03 AM

## 2013-04-18 NOTE — Progress Notes (Signed)
   Subjective:  "Weak"; mild dyspnea.   Objective:  Filed Vitals:   04/18/13 0400 04/18/13 0500 04/18/13 0600 04/18/13 0700  BP: 145/63 148/62 133/67 156/65  Pulse: 70 70 71 68  Temp:      TempSrc:      Resp: 26 22 25 27   Height:      Weight:  193 lb 12.6 oz (87.9 kg)    SpO2: 99% 100% 100% 92%    Intake/Output from previous day:  Intake/Output Summary (Last 24 hours) at 04/18/13 0743 Last data filed at 04/18/13 0700  Gross per 24 hour  Intake 2287.3 ml  Output    935 ml  Net 1352.3 ml    Physical Exam: Physical exam: Well-developed well-nourished in no acute distress.  Skin is warm and dry.  HEENT is normal.  Neck - surgical incision site without evidence of infection Chest diminished BS and crackles bases Cardiovascular exam irregular; 2/6 systolic murmur, no diastolic murmur Abdominal exam nontender or distended. No masses palpated. Extremities show no edema. neuro grossly intact    Lab Results: Basic Metabolic Panel:  Recent Labs  04/17/13 1400 04/18/13 0420  NA 139 140  K 3.5 3.7  CL 103 102  CO2 24 22  GLUCOSE 202* 176*  BUN 45* 45*  CREATININE 2.14* 2.12*  CALCIUM 8.6 8.5  PHOS 2.2*  --    CBC:  Recent Labs  04/17/13 0400 04/18/13 0420  WBC 10.2 12.2*  HGB 8.4* 8.4*  HCT 24.9* 24.1*  MCV 82.2 79.0  PLT 231 386    Assessment/Plan:  1 severe symptomatic AS- s/p AVR; slow recovery but improving 2 chronic stage III renal failure-patient is status post renal transplant. Nephrology following; renal function unchanged this AM. 3 atrial fibrillation-patient back in sinus this AM. Continue amiodarone and lopressor. Continue coumadin. 4 history of coronary artery disease-resume statin pior to DC. 5 hypertension-Follow 6 Abnormal Chest CT -fu chest CT 3 months 7 Postoperative volume excess; continue diuresis; managed by nephrology. 8 Hypokalemia - improved  [name] 04/18/2013, 7:43 AM

## 2013-04-18 NOTE — Progress Notes (Addendum)
      [address]       [place] [address]             [phone]      6 Days Post-Op Procedure(s) (LRB): AORTIC VALVE REPLACEMENT (AVR) (N/A) MAZE (N/A) INTRAOPERATIVE TRANSESOPHAGEAL ECHOCARDIOGRAM (N/A)  Subjective:  [name] states she is doing much better.  She looks great.  She does question if her catheter can be removed.   Objective: Vital signs in last 24 hours: Temp:  [97.5 F (36.4 C)-98.4 F (36.9 C)] 98.1 F (36.7 C) (10/01 0740) Pulse Rate:  [34-118] 68 (10/01 0700) Cardiac Rhythm:  [-] Normal sinus rhythm (09/30 1942) Resp:  [16-33] 27 (10/01 0700) BP: (97-156)/(39-88) 156/65 mmHg (10/01 0700) SpO2:  [77 %-100 %] 92 % (10/01 0700) Weight:  [193 lb 12.6 oz (87.9 kg)] 193 lb 12.6 oz (87.9 kg) (10/01 0500)  Intake/Output from previous day: 09/30 0701 - 10/01 0700 In: 2287.3 [P.O.:540; I.V.:1397.3; IV Piggyback:350] Out: 935 [Urine:935]  General appearance: alert, cooperative and no distress Heart: regular rate and rhythm Lungs: clear to auscultation bilaterally Abdomen: soft, non-tender; bowel sounds normal; no masses,  no organomegaly Extremities: edema trace Wound: clean and dry  Lab Results:  Recent Labs  04/17/13 0400 04/18/13 0420  WBC 10.2 12.2*  HGB 8.4* 8.4*  HCT 24.9* 24.1*  PLT 231 386   BMET:  Recent Labs  04/17/13 1400 04/18/13 0420  NA 139 140  K 3.5 3.7  CL 103 102  CO2 24 22  GLUCOSE 202* 176*  BUN 45* 45*  CREATININE 2.14* 2.12*  CALCIUM 8.6 8.5    PT/INR:  Recent Labs  04/18/13 0420  LABPROT 24.0*  INR 2.23*   ABG    Component Value Date/Time   PHART 7.357 04/16/2013 0520   HCO3 22.8 04/16/2013 0520   TCO2 24.1 04/16/2013 0520   ACIDBASEDEF 1.9 04/16/2013 0520   O2SAT 97.1 04/16/2013 0520   CBG (last 3)   Recent Labs  04/17/13 1550 04/17/13 2141 04/18/13 0747  GLUCAP 183* 186* 151*    Assessment/Plan: S/P Procedure(s) (LRB): AORTIC VALVE REPLACEMENT (AVR) (N/A) MAZE  (N/A) INTRAOPERATIVE TRANSESOPHAGEAL ECHOCARDIOGRAM (N/A)  1. CV- Previous Atrial Fibrillation, NSR this morning- continue Lopressor and Amiodarone 2. INR 2.23- will continue at 2.5 mg daily 3. Renal- Acute on CRI, creatinine stable, remains volume overloaded continue Lasix 4. ID- possible HCAP on Vanc and Zosyn 5. Dysphagia- MBS completed yesterday and showed no evidence of dysfunction, patient tolerating carb modified diet, will d/c IV fluids 6. DM- CBGs controlled, continue current care for now 7. Dispo- patient looks great, d/c foley catheter, possible transfer to [facility] today   LOS: 13 days    [name], [name] 04/18/2013  I have seen and examined the patient and agree with the assessment and plan as outlined.  Making good progress. Continue amiodarone, metoprolol and coumadin.  Decrease lasix.  D/C central line.  Add levemir insulin and continue SSI.  Restart oral agent soon depending upon oral intake.  Check Tacrolimus level tomorrow due to potential interaction w/ amiodarone. Transfer step down.  Jesalyn Finazzo H 04/18/2013 8:52 AM

## 2013-04-18 NOTE — Progress Notes (Signed)
Came to assess pt with ultrasound for PIV.  She is is chair.   Stated,  "I've already been stuck by 3 different people"  I spoke with primary RN and she is going to check with Dr. Roxy Manns when he gets out of surgery as to what he wants to do.

## 2013-04-18 NOTE — Progress Notes (Signed)
[name] to 2W23 on monitor in wheelchair without event. Pt hooked up to telemetry by Verdene Lennert RN and bedside report was given.   [name]

## 2013-04-18 NOTE — Progress Notes (Signed)
Pt has order to d/c sleeve. Pt has no peripheral IV access, and myself, 2 other RNs, nor IV team were able to gain PIV access on the patient. Will leave subclavian CVL in for now. Passed on in report to nurse receiving pt on 2W of the situation and advised they relay information to MD Roxy Manns once he rounds on patient after he is finished with his case in the OR.  Lorre Munroe

## 2013-04-18 NOTE — Evaluation (Signed)
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 Pt with severe aortic stenosis now s/p AVR on 9/24.     Clinical Impression   Pt admitted with above. Will continue to follow acutely in order to address below problem list. Recommending CIR to further maximize independence with ADLs before return home, but will update plan pending pt progress.    OT Assessment  Patient needs continued OT Services    Follow Up Recommendations  CIR;Supervision/Assistance - 24 hour    Barriers to Discharge      Equipment Recommendations  3 in 1 bedside comode    Recommendations for Other Services    Frequency  Min 2X/week    Precautions / Restrictions Precautions Precautions: Sternal;Fall Precaution Comments: Reviewed sternal precautions   Pertinent Vitals/Pain See vitals    ADL  Grooming: Performed;Wash/dry hands;Wash/dry face;Min guard Where Assessed - Grooming: Supported standing Upper Body Bathing: Simulated;Moderate assistance Where Assessed - Upper Body Bathing: Unsupported sitting Lower Body Bathing: Simulated;Maximal assistance Where Assessed - Lower Body Bathing: Supported sit to stand Upper Body Dressing: Simulated;Moderate assistance Where Assessed - Upper Body Dressing: Unsupported sitting Lower Body Dressing: Simulated;Maximal assistance Where Assessed - Lower Body Dressing: Supported sit to stand Toilet Transfer: Simulated;Moderate assistance Toilet Transfer Method: Sit to  Loss adjuster, chartered:  Building services engineer) Equipment Used: Gait belt;Rolling walker Transfers/Ambulation Related to ADLs: Min assist with RW to ambulate to sink. Pt tends to lean heavilly on RW through bil UEs as she fatigues. ADL Comments: Pt quickly fatigued after performing grooming at sink and requesting to return to chair.    OT Diagnosis: Generalized weakness  OT Problem List: Decreased strength;Impaired balance (sitting and/or standing);Decreased activity tolerance;Decreased safety awareness;Decreased knowledge of precautions;Decreased knowledge of use of DME or AE OT Treatment Interventions: Self-care/ADL training;DME and/or AE instruction;Therapeutic activities;Patient/family education;Balance training   OT Goals(Current goals can be found in the care plan section) Acute Rehab OT Goals OT Goal Formulation: With patient Time For Goal Achievement: 05/02/13 Potential to Achieve Goals: Good  Visit Information  Last OT Received On: 04/18/13 Assistance Needed: +1 History of Present Illness: [name] is a 67 y/o F with history of CAD s/p MI/RCA stent 2005, aortic stenosis, 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 Pt with severe aortic stenosis now s/p AVR on 9/24.         Prior [place] expects to be discharged to:: Private residence Living Arrangements: Spouse/significant other Available Help at Discharge: Family;Available 24 hours/day Type of Home: House Home Access: Stairs to enter [employer] of Steps: 1 Home Layout: Two level;Bed/bath upstairs Alternate Level Stairs-Number of Steps: flight Alternate Level Stairs-Rails: Right Home Equipment: Cane - single point Prior Function Level of Independence: Independent with assistive  device(s) Communication Communication: No difficulties         Vision/Perception  Cognition  Cognition Arousal/Alertness: Awake/alert Behavior During Therapy: Flat affect Overall Cognitive Status: Impaired/Different from baseline Area of Impairment: Orientation Orientation Level: Time Memory: Decreased recall of precautions;Decreased short-term memory    Extremity/Trunk Assessment Upper Extremity Assessment Upper Extremity Assessment: Generalized weakness     Mobility Bed Mobility Bed Mobility: Not assessed Transfers Transfers: Sit to Stand;Stand to Sit Sit to Stand: 4: Min assist;From chair/3-in-1 Stand to Sit: 3: Mod assist;To chair/3-in-1 Details for Transfer Assistance: Increased assist to control descent to chair.     Exercise     Balance     End of Session OT - End of Session Equipment Utilized During Treatment: Rolling walker;Gait belt Activity Tolerance: Patient limited by fatigue Patient left: in chair;with call bell/phone within reach  GO   04/18/2013 Darrol Jump OTR/L Pager [phone] Office [phone]    Darrol Jump 04/18/2013, 5:40 PM

## 2013-04-18 NOTE — Progress Notes (Signed)
CARDIAC REHAB PHASE I   PRE:  Rate/Rhythm: 82 SR  BP:  Supine:   Sitting: 150/80  Standing:    SaO2: 96-97 RA  MODE:  Ambulation: 150 ft   POST:  Rate/Rhythm: 71 SR  BP:  Supine:   Sitting: 120/80  Standing:    SaO2: 98 2L 1400-1140 On arrival pt in bathroom. Pt tired after coming from bathroom and dyspneic. Placed her on side of bed to rest. Assisted X 2 used walker, gait belt and O2 2L to ambulate. Gait fairly steady with walker. Pt tires easily and is DOE. She took 2-3 standing rest stops in 150 foot walk. She bears a lot of weight on arms on the rest stops leaning on walker. Pt to recliner after walk then to [facility].Instructed pt to call for assistance when she was ready to get off BSC. Pt's husband in room with her.  Rodney Langton RN 04/18/2013 2:39 PM

## 2013-04-18 NOTE — Progress Notes (Signed)
Received from [facility]. Assisted to bedside chair. Denies any distress at this time. VSS. Call bell and telephone near.Cindee Salt

## 2013-04-19 ENCOUNTER — Encounter (HOSPITAL_COMMUNITY): Payer: Self-pay | Admitting: Physical Medicine and Rehabilitation

## 2013-04-19 DIAGNOSIS — R5381 Other malaise: Secondary | ICD-10-CM

## 2013-04-19 LAB — BASIC METABOLIC PANEL
Calcium: 8.7 mg/dL (ref 8.4–10.5)
Creatinine, Ser: 2.44 mg/dL — ABNORMAL HIGH (ref 0.50–1.10)
GFR calc Af Amer: 22 mL/min — ABNORMAL LOW (ref >90)
GFR calc non Af Amer: 19 mL/min — ABNORMAL LOW (ref >90)

## 2013-04-19 LAB — PROTIME-INR
INR: 2.47 — ABNORMAL HIGH (ref 0.00–1.49)
Prothrombin Time: 25.9 seconds — ABNORMAL HIGH (ref 11.6–15.2)

## 2013-04-19 LAB — CBC
MCHC: 33.7 g/dL (ref 30.0–36.0)
MCV: 82.2 fL (ref 78.0–100.0)
Platelets: 344 10*3/uL (ref 150–400)
RBC: 3.43 MIL/uL — ABNORMAL LOW (ref 3.87–5.11)
RDW: 16.5 % — ABNORMAL HIGH (ref 11.5–15.5)
WBC: 15.6 10*3/uL — ABNORMAL HIGH (ref 4.0–10.5)

## 2013-04-19 LAB — VANCOMYCIN, TROUGH: Vancomycin Tr: 32.8 ug/mL (ref 10.0–20.0)

## 2013-04-19 LAB — GLUCOSE, CAPILLARY: Glucose-Capillary: 169 mg/dL — ABNORMAL HIGH (ref 70–99)

## 2013-04-19 MED ORDER — AMIODARONE HCL 200 MG PO TABS
200.0000 mg | ORAL_TABLET | Freq: Two times a day (BID) | ORAL | Status: DC
  Administered 2013-04-19 – 2013-04-23 (×8): 200 mg via ORAL
  Filled 2013-04-19 (×10): qty 1

## 2013-04-19 MED ORDER — INSULIN DETEMIR 100 UNIT/ML ~~LOC~~ SOLN
10.0000 [IU] | Freq: Two times a day (BID) | SUBCUTANEOUS | Status: DC
  Administered 2013-04-19: 10 [IU] via SUBCUTANEOUS
  Filled 2013-04-19 (×4): qty 0.1

## 2013-04-19 MED ORDER — WARFARIN SODIUM 2 MG PO TABS
2.0000 mg | ORAL_TABLET | Freq: Every day | ORAL | Status: DC
  Administered 2013-04-19 – 2013-04-20 (×2): 2 mg via ORAL
  Filled 2013-04-19 (×3): qty 1

## 2013-04-19 MED ORDER — GLIPIZIDE ER 10 MG PO TB24
10.0000 mg | ORAL_TABLET | Freq: Two times a day (BID) | ORAL | Status: DC
  Administered 2013-04-19 – 2013-04-23 (×8): 10 mg via ORAL
  Filled 2013-04-19 (×12): qty 1

## 2013-04-19 MED ORDER — TACROLIMUS 1 MG PO CAPS
5.0000 mg | ORAL_CAPSULE | Freq: Two times a day (BID) | ORAL | Status: DC
  Administered 2013-04-19 – 2013-04-23 (×8): 5 mg via ORAL
  Filled 2013-04-19 (×10): qty 5

## 2013-04-19 MED ORDER — FUROSEMIDE 10 MG/ML IJ SOLN
80.0000 mg | Freq: Four times a day (QID) | INTRAMUSCULAR | Status: DC
  Administered 2013-04-19 – 2013-04-23 (×17): 80 mg via INTRAVENOUS
  Filled 2013-04-19 (×19): qty 8

## 2013-04-19 MED ORDER — POTASSIUM CHLORIDE CRYS ER 20 MEQ PO TBCR
40.0000 meq | EXTENDED_RELEASE_TABLET | Freq: Once | ORAL | Status: AC
  Administered 2013-04-19: 40 meq via ORAL

## 2013-04-19 NOTE — Progress Notes (Signed)
[facility] ROUNDING NOTE   Subjective:   Interval History: improved urine output, weight increase may be due to different scales  Objective:  Vital signs in last 24 hours:  Temp:  [98.2 F (36.8 C)-98.6 F (37 C)] 98.6 F (37 C) (10/02 0409) Pulse Rate:  [66-68] 66 (10/02 1054) Resp:  [18] 18 (10/02 0409) BP: (129-156)/(62-79) 129/79 mmHg (10/02 1054) SpO2:  [95 %-96 %] 95 % (10/02 0409) Weight:  [90.2 kg (198 lb 13.7 oz)] 90.2 kg (198 lb 13.7 oz) (10/02 0409)  Weight change: 2.3 kg (5 lb 1.1 oz) Filed Weights   04/17/13 0500 04/18/13 0500 04/19/13 0409  Weight: 86.9 kg (191 lb 9.3 oz) 87.9 kg (193 lb 12.6 oz) 90.2 kg (198 lb 13.7 oz)    Intake/Output: I/O last 3 completed shifts: In: [id number] [P.O.:240; I.V.:700; IV Piggyback:100] Out: 1170 [Urine:1170]   Intake/Output this shift:     CVS- RRR  RS- CTA decreased rales up tp mid back  ABD- BS present soft non-distended  EXT- no edema    Basic Metabolic Panel:  Recent Labs Lab 04/12/13 1800  04/13/13 0330 04/13/13 1600  04/16/13 0344 04/17/13 0400 04/17/13 1400 04/18/13 0420 04/19/13 0700  NA  --   < > 140  --   < > 139 141 139 140 140  K  --   < > 4.6  --   < > 4.0 3.1* 3.5 3.7 3.3*  CL  --   < > 108  --   < > 103 103 103 102 102  CO2  --   --  19  --   < > 23 25 24 22 23   GLUCOSE  --   < > 111*  --   < > 174* 178* 202* 176* 101*  BUN  --   < > 22  --   < > 50* 47* 45* 45* 46*  CREATININE 1.13*  < > 1.33* 1.61*  < > 2.34* 2.05* 2.14* 2.12* 2.44*  CALCIUM  --   --  8.1*  --   < > 9.0 8.8 8.6 8.5 8.7  MG 2.9*  --  2.5 2.3  --   --   --   --   --   --   PHOS  --   --   --   --   --   --   --  2.2*  --   --   < > = values in this interval not displayed.  Liver Function Tests:  Recent Labs Lab 04/15/13 0356 04/17/13 1400  AST 72*  --   ALT 97*  --   ALKPHOS 111  --   BILITOT 0.3  --   PROT 6.1  --   ALBUMIN 2.8* 2.5*   No results found for this basename: LIPASE, AMYLASE,  in the last  168 hours No results found for this basename: AMMONIA,  in the last 168 hours  CBC:  Recent Labs Lab 04/15/13 0356 04/16/13 0344 04/17/13 0400 04/18/13 0420 04/19/13 0700  WBC 19.6* 13.3* 10.2 12.2* 15.6*  NEUTROABS 15.6*  --   --   --   --   HGB 9.2* 8.2* 8.4* 8.4* 9.5*  HCT 27.5* 24.5* 24.9* 24.1* 28.2*  MCV 83.3 82.8 82.2 79.0 82.2  PLT 305 188 231 386 344    Cardiac Enzymes: No results found for this basename: CKTOTAL, CKMB, CKMBINDEX, TROPONINI,  in the last 168 hours  BNP: No components found with this basename:  POCBNP,   CBG:  Recent Labs Lab 04/18/13 1144 04/18/13 1649 04/18/13 1808 04/18/13 2158 04/19/13 1128  GLUCAP 131* 69* 91 85 204*    Microbiology: Results for orders placed during the hospital encounter of 04/05/13  URINE CULTURE     Status: None   Collection Time    04/07/13  5:53 AM      Result Value Range Status   Specimen Description URINE, CLEAN CATCH   Final   Special Requests NONE   Final   Culture  Setup Time     Final   Value: 04/07/2013 15:27     Performed at [facility]     Final   Value: >=100,000 COLONIES/ML     Performed at [facility]   Culture     Final   Value: Multiple bacterial morphotypes present, none predominant. Suggest appropriate recollection if clinically indicated.     Performed at [facility]   Report Status 04/08/2013 FINAL   Final  SURGICAL PCR SCREEN     Status: None   Collection Time    04/07/13  4:45 PM      Result Value Range Status   MRSA, PCR NEGATIVE  NEGATIVE Final   Staphylococcus aureus NEGATIVE  NEGATIVE Final   Comment:            The Xpert SA Assay (FDA     approved for NASAL specimens     in patients over 85 years of age),     is one component of     a comprehensive surveillance     program.  Test performance has     been validated by [employer] for patients greater     than or equal to 30 year old.     It is not intended     to diagnose  infection nor to     guide or monitor treatment.  URINE CULTURE     Status: None   Collection Time    04/14/13  9:57 PM      Result Value Range Status   Specimen Description URINE, CATHETERIZED   Final   Special Requests Immunocompromised   Final   Culture  Setup Time     Final   Value: 04/15/2013 03:26     Performed at [facility]     Final   Value: NO GROWTH     Performed at [facility]   Culture     Final   Value: NO GROWTH     Performed at [facility]   Report Status 04/16/2013 FINAL   Final  CULTURE, BLOOD (SINGLE)     Status: None   Collection Time    04/14/13 10:38 PM      Result Value Range Status   Specimen Description BLOOD RIGHT HAND   Final   Special Requests BOTTLES DRAWN AEROBIC AND ANAEROBIC 2CC   Final   Culture  Setup Time     Final   Value: 04/15/2013 02:58     Performed at [facility]   Culture     Final   Value:        BLOOD CULTURE RECEIVED NO GROWTH TO DATE CULTURE WILL BE HELD FOR 5 DAYS BEFORE ISSUING A FINAL NEGATIVE REPORT     Performed at [facility]   Report Status PENDING   Incomplete  CULTURE, RESPIRATORY (NON-EXPECTORATED)  Status: None   Collection Time    04/15/13  1:30 PM      Result Value Range Status   Specimen Description TRACHEAL ASPIRATE   Final   Special Requests Immunocompromised   Final   Gram Stain     Final   Value: RARE WBC PRESENT, PREDOMINANTLY PMN     NO SQUAMOUS EPITHELIAL CELLS SEEN     NO ORGANISMS SEEN     Performed at [facility]   Culture     Final   Value: NO GROWTH 2 DAYS     Performed at [facility]   Report Status 04/18/2013 FINAL   Final    Coagulation Studies:  Recent Labs  04/17/13 0400 04/18/13 0420 04/19/13 0700  LABPROT 20.9* 24.0* 25.9*  INR 1.86* 2.23* 2.47*    Urinalysis: No results found for this basename: COLORURINE, APPERANCEUR, LABSPEC, PHURINE, GLUCOSEU, HGBUR, BILIRUBINUR, KETONESUR, PROTEINUR,  UROBILINOGEN, NITRITE, LEUKOCYTESUR,  in the last 72 hours    Imaging: Dg Chest Port 1 View  04/18/2013   CLINICAL DATA:  Aortic stenosis. Status post aortic valve replacement.  EXAM: PORTABLE CHEST - 1 VIEW  COMPARISON:  04/17/2013  FINDINGS: Persistent cardiomegaly. Improved aeration at both lung bases with diminished atelectasis and effusions. No pneumothorax.  IMPRESSION: Improving atelectasis and small effusions. Pulmonary vascularity is now normal.   Electronically Signed   By: Rozetta Nunnery   On: 04/18/2013 07:22     Medications:     . amiodarone  200 mg Oral BID PC  . cinacalcet  60 mg Oral Q48H  . dapsone  25 mg Oral Daily  . docusate sodium  200 mg Oral Daily  . furosemide  40 mg Oral BID  . glipiZIDE  10 mg Oral BID AC  . insulin aspart  0-20 Units Subcutaneous TID WC  . insulin aspart  0-5 Units Subcutaneous QHS  . insulin detemir  10 Units Subcutaneous BID  . metoprolol tartrate  25 mg Oral BID  . mycophenolate  500 mg Oral BID  . pantoprazole (PROTONIX) IV  40 mg Intravenous Q24H  . piperacillin-tazobactam (ZOSYN)  IV  3.375 g Intravenous Q8H  . potassium chloride  20 mEq Oral BID  . sodium chloride  3 mL Intravenous Q12H  . sodium chloride  3 mL Intravenous Q12H  . tacrolimus  5 mg Oral BID  . vancomycin  1,000 mg Intravenous Q24H  . warfarin  2 mg Oral q1800  . Warfarin - Physician Dosing Inpatient   Does not apply q1800   sodium chloride, diphenhydrAMINE, fentaNYL, labetalol, ondansetron (ZOFRAN) IV, sodium chloride, sodium chloride  Assessment/ Plan:   1.Acute on chronic kidney disease: patient with Cr trended back towards baseline following heart cath early in hospitalization and had been stable until the day of her operation.  - patient does not seem to have indications for emergent dialysis at this time  - will continue IV lasix - avoid nephrotoxic agents  2. 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 Had only been on  prograf here. Most recent prograf level was 5.4 in early September.  - will switch to PO meds    LOS: 14 [name] W @TODAY @12 :10 PM

## 2013-04-19 NOTE — Progress Notes (Signed)
ANTIBIOTIC CONSULT NOTE - FOLLOW UP  Pharmacy Consult for Vancomycin  Indication: pneumonia   Recent Labs  04/19/13 1300  [place] 32.8*    Plan -Hold vancomycin for now -Check random vancomycin level in the AM  Thank you for allowing me to take part in this patient's care,  Narda Bonds, PharmD Clinical Pharmacist Phone: [phone] Pager: [phone] 04/19/2013 3:23 PM

## 2013-04-19 NOTE — Progress Notes (Signed)
CARDIAC REHAB PHASE I   PRE:  Rate/Rhythm: 64 SR    BP: sitting 140/76    SaO2: 95 RA  MODE:  Ambulation: 75 ft   POST:  Rate/Rhythm: 74 SR    BP: sitting 157/77     SaO2: 94 RA  Pt with many liquid stools today. Sat on BSC long time before walk. Walked 36 ft with RW, assist x2 but had to urgently get w/c and roll her back to room and to [facility]. Pt stronger with steps and more compliant with sternal precautions today although still requires verbal cues at times. DOE with rest x2 but did not lean over RW like yesterday. Will continue to follow.  [id number]   Josephina Shih [place] CES, ACSM 04/19/2013 3:27 PM

## 2013-04-19 NOTE — Care Management Note (Signed)
    Page 1 of 2   04/23/2013     2:14:58 PM   CARE MANAGEMENT NOTE 04/23/2013  Patient:  [name], [name]   Account Number:  [id number]  Date Initiated:  04/10/2013  Documentation initiated by:  GRAVES-BIGELOW,BRENDA  Subjective/Objective Assessment:   Pt admitted for severe symptomatic AS- for AVR/CABG/Maze Wed.     Action/Plan:   CM will continue to monitor for disposition needs.   Anticipated DC Date:  04/23/2013   Anticipated DC Plan:  [place]  CM consult      PAC Choice  [facility]   Choice offered to / List presented to:  C-3 Spouse   DME arranged  Vassie Moselle      DME agency  [facility] arranged  HH-1 RN  [place].   Status of service:  Completed, signed off Medicare Important Message given?   (If response is "NO", the following Medicare IM given date fields will be blank) Date Medicare IM given:   Date Additional Medicare IM given:    Discharge Disposition:  [place]  Per UR Regulation:  Reviewed for med. necessity/level of care/duration of stay  If discussed at [place] of Stay Meetings, dates discussed:   04/24/2013    Comments:  Contact:  Nazaryan,Roy Spouse     [phone]  1/6 [place] RN,BSN spoke w pt and husband. pt wants rolator-rw w seat and md has ordered this. ahc will del to room prior to disch. pt would like hhrn and pt. no pref to hhc agencies. went over list and no pref. ref to mary w ahc for hhrn and phy ther.  04/20/13 JULIE AMERSON,RN,BSN [id number] PT REFUSING INPT REHAB/SNF FOR REHAB.  DESIRES [facility]. WILL NEED ORDERS, FACE TO FACE DOCUMENTATION.  THANKS.  04/19/13 JULIE AMERSON,RN,BSN [id number] PT/OT RECOMMENDING CIR; AWAIT REHAB MD TO SEE.  WILL FOLLOW.  04-13-13 11:30am Luz Lex, RNBSN [phone] Extubated this am.  Has spouse who is able to care  for.

## 2013-04-19 NOTE — Progress Notes (Addendum)
[address]       [place] [address]             [phone]      7 Days Post-Op  Procedure(s) (LRB): AORTIC VALVE REPLACEMENT (AVR) (N/A) MAZE (N/A) INTRAOPERATIVE TRANSESOPHAGEAL ECHOCARDIOGRAM (N/A) Subjective:  having some diarrhea, mostly feels well  Objective  Telemetry sinus rhythm  Temp:  [98.1 F (36.7 C)-99 F (37.2 C)] 98.6 F (37 C) (10/02 0409) Pulse Rate:  [62-109] 66 (10/02 0409) Resp:  [16-30] 18 (10/02 0409) BP: (141-156)/(62-77) 156/77 mmHg (10/02 0409) SpO2:  [92 %-100 %] 95 % (10/02 0409) Weight:  [198 lb 13.7 oz (90.2 kg)] 198 lb 13.7 oz (90.2 kg) (10/02 0409)   Intake/Output Summary (Last 24 hours) at 04/19/13 0729 Last data filed at 04/19/13 0300  Gross per 24 hour  Intake    340 ml  Output    710 ml  Net   -370 ml       General appearance: alert, cooperative and no distress Heart: regular rate and rhythm and 2/6 systolic murmur Lungs: clear to auscultation bilaterally Abdomen: soft, nontender Extremities: trace edema, L arm swelling Wound: incisions healing well  Lab Results:  Recent Labs  04/17/13 1400 04/18/13 0420  NA 139 140  K 3.5 3.7  CL 103 102  CO2 24 22  GLUCOSE 202* 176*  BUN 45* 45*  CREATININE 2.14* 2.12*  CALCIUM 8.6 8.5  PHOS 2.2*  --     Recent Labs  04/17/13 1400  ALBUMIN 2.5*   No results found for this basename: LIPASE, AMYLASE,  in the last 72 hours  Recent Labs  04/17/13 0400 04/18/13 0420  WBC 10.2 12.2*  HGB 8.4* 8.4*  HCT 24.9* 24.1*  MCV 82.2 79.0  PLT 231 386   No results found for this basename: CKTOTAL, CKMB, TROPONINI,  in the last 72 hours No components found with this basename: POCBNP,  No results found for this basename: DDIMER,  in the last 72 hours No results found for this basename: HGBA1C,  in the last 72 hours No results found for this basename: CHOL, HDL, LDLCALC, TRIG, CHOLHDL,  in the last 72 hours No results found for this basename: TSH,  T4TOTAL, FREET3, T3FREE, THYROIDAB,  in the last 72 hours No results found for this basename: VITAMINB12, FOLATE, FERRITIN, TIBC, IRON, RETICCTPCT,  in the last 72 hours  Medications: Scheduled . amiodarone  400 mg Oral BID PC  . bisacodyl  10 mg Oral Daily   Or  . bisacodyl  10 mg Rectal Daily  . cinacalcet  60 mg Oral Q48H  . dapsone  25 mg Oral Daily  . docusate sodium  200 mg Oral Daily  . furosemide  40 mg Oral BID  . insulin aspart  0-20 Units Subcutaneous TID WC  . insulin aspart  0-5 Units Subcutaneous QHS  . insulin detemir  20 Units Subcutaneous BID  . metoprolol tartrate  25 mg Oral BID  . mycophenolate  500 mg Oral BID  . pantoprazole (PROTONIX) IV  40 mg Intravenous Q24H  . piperacillin-tazobactam (ZOSYN)  IV  3.375 g Intravenous Q8H  . potassium chloride  20 mEq Oral BID  . sodium chloride  3 mL Intravenous Q12H  . sodium chloride  3 mL Intravenous Q12H  . tacrolimus  5 mg Oral BID  . vancomycin  1,000 mg Intravenous Q24H  . warfarin  2.5 mg Oral q1800  . Warfarin - Physician  Dosing Inpatient   Does not apply q1800     Radiology/Studies:  Dg Chest Port 1 View  04/18/2013   CLINICAL DATA:  Aortic stenosis. Status post aortic valve replacement.  EXAM: PORTABLE CHEST - 1 VIEW  COMPARISON:  04/17/2013  FINDINGS: Persistent cardiomegaly. Improved aeration at both lung bases with diminished atelectasis and effusions. No pneumothorax.  IMPRESSION: Improving atelectasis and small effusions. Pulmonary vascularity is now normal.   Electronically Signed   By: Rozetta Nunnery   On: 04/18/2013 07:22    INR: Will add last result for INR, ABG once components are confirmed Will add last 4 CBG results once components are confirmed  Assessment/Plan: S/P Procedure(s) (LRB): AORTIC VALVE REPLACEMENT (AVR) (N/A) MAZE (N/A) INTRAOPERATIVE TRANSESOPHAGEAL ECHOCARDIOGRAM (N/A)  1 diarrhea, currently on vanc/zosyn- will check cdiff. Stop laxatived 2 No new BMET - nephrology  following 3 sugars well controlled- appears to be eating fairly well. Will reduce levemir and restart glucotrol 4 sinus rhythm on current rx 5 INR pending 6 leukocytosis trending higher- conts current abx for poss HCAP 7 Left arm swelling- fistula is functional, may need to consider venous duplex  LOS: 14 days    [name],[name] 10/2/20147:29 AM  I have seen and examined the patient and agree with the assessment and plan as outlined.  Although her weight remains > pre-op I am concerned that she doesn't clinically look to be very volume overloaded, her creatinine has gone up again and now she's having diarrhea.  Continued high dose lasix given yesterday per Nephrology.  Will defer management of diuretics and anti-rejection drugs to Nephrology team, but I would favor decreasing lasix for now.  Will start empiric treatment for C.diff if diarrhea gets any worse and WBC rises further.  Clinically I doubt that she has pneumonia, but recheck CXR and labs in am and continue Vanc/Zosyn for now.  Decrease coumadin dose.  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  It was placed over 7 days ago and must be removed ASAP.  [name] 04/19/2013 8:45 AM

## 2013-04-19 NOTE — Progress Notes (Signed)
CSW spoke to patient and husband at bedside about backup SNF plan vs. CIR. Patient stated she does not want a SNF, she wants Home Health set up. CSW passed this on to CM who will follow up with patient. CSW signing off. Please re consult if CSW needs arise.  [name], MSW, [place]

## 2013-04-19 NOTE — Progress Notes (Signed)
Pt not wanting to walk because of the diarrhea that she's been having today and yesterday.   Marrissa Dai M

## 2013-04-19 NOTE — Progress Notes (Signed)
PT Cancellation Note  Patient Details Name: [name] MRN: [id number] DOB: 07/30/1945   Cancelled Treatment:    Reason Eval/Treat Not Completed: Fatigue/lethargy limiting ability to participate. Pt reports being up all night with diarrhea, having just received medicine to sleep and being too fatigued to participate at this time even with HEP. Encouraged ambulation today with nursing. Will attempt at later date.    [name] 04/19/2013, 9:42 AM Elwyn Reach, [place]

## 2013-04-19 NOTE — Progress Notes (Signed)
1150 Came to walk with pt. Pt states she will try to walk at 1430 as she is tired now. Will return as time permits. Hjalmar Ballengee DunlapRNBSN

## 2013-04-19 NOTE — Progress Notes (Signed)
EPW removed per protocol. Ends intact. VSS. Pt educated about bedrest. Verbalized understanding. Will continue to monitor pt.

## 2013-04-20 ENCOUNTER — Inpatient Hospital Stay (HOSPITAL_COMMUNITY): Payer: Medicare Other

## 2013-04-20 LAB — BASIC METABOLIC PANEL
CO2: 23 mEq/L (ref 19–32)
Chloride: 104 mEq/L (ref 96–112)
Creatinine, Ser: 2.55 mg/dL — ABNORMAL HIGH (ref 0.50–1.10)
GFR calc Af Amer: 21 mL/min — ABNORMAL LOW (ref >90)
Potassium: 3.6 mEq/L (ref 3.5–5.1)

## 2013-04-20 LAB — CBC
Hemoglobin: 8.6 g/dL — ABNORMAL LOW (ref 12.0–15.0)
MCV: 83.4 fL (ref 78.0–100.0)
Platelets: 342 10*3/uL (ref 150–400)
RBC: 3.2 MIL/uL — ABNORMAL LOW (ref 3.87–5.11)
RDW: 16.8 % — ABNORMAL HIGH (ref 11.5–15.5)
WBC: 15.8 10*3/uL — ABNORMAL HIGH (ref 4.0–10.5)

## 2013-04-20 LAB — GLUCOSE, CAPILLARY
Glucose-Capillary: 67 mg/dL — ABNORMAL LOW (ref 70–99)
Glucose-Capillary: 70 mg/dL (ref 70–99)
Glucose-Capillary: 85 mg/dL (ref 70–99)

## 2013-04-20 LAB — PROTIME-INR: INR: 1.97 — ABNORMAL HIGH (ref 0.00–1.49)

## 2013-04-20 LAB — VANCOMYCIN, RANDOM: Vancomycin Rm: 25.8 ug/mL

## 2013-04-20 MED ORDER — METOLAZONE 10 MG PO TABS
10.0000 mg | ORAL_TABLET | Freq: Once | ORAL | Status: AC
  Administered 2013-04-20: 10 mg via ORAL
  Filled 2013-04-20 (×2): qty 1

## 2013-04-20 MED ORDER — LOPERAMIDE HCL 1 MG/5ML PO LIQD
2.0000 mg | ORAL | Status: DC | PRN
  Administered 2013-04-20 – 2013-04-22 (×4): 2 mg via ORAL
  Filled 2013-04-20 (×5): qty 10

## 2013-04-20 MED ORDER — PANTOPRAZOLE SODIUM 40 MG PO TBEC
40.0000 mg | DELAYED_RELEASE_TABLET | Freq: Every day | ORAL | Status: DC
  Administered 2013-04-20 – 2013-04-22 (×4): 40 mg via ORAL
  Filled 2013-04-20 (×4): qty 1

## 2013-04-20 MED ORDER — MYCOPHENOLATE SODIUM 180 MG PO TBEC
360.0000 mg | DELAYED_RELEASE_TABLET | Freq: Two times a day (BID) | ORAL | Status: DC
  Administered 2013-04-20 – 2013-04-23 (×7): 360 mg via ORAL
  Filled 2013-04-20 (×9): qty 2

## 2013-04-20 MED ORDER — PIPERACILLIN-TAZOBACTAM IN DEX 2-0.25 GM/50ML IV SOLN
2.2500 g | Freq: Three times a day (TID) | INTRAVENOUS | Status: DC
  Administered 2013-04-20 – 2013-04-21 (×3): 2.25 g via INTRAVENOUS
  Filled 2013-04-20 (×5): qty 50

## 2013-04-20 NOTE — Progress Notes (Signed)
1100 Cardiac Rehab Have attempted to get to ambulate twice today. She has refused c/o of being tired and still having diarrhea. We will follow as time permits. Rodney Langton RN

## 2013-04-20 NOTE — Progress Notes (Signed)
PT Cancellation Note  Patient Details Name: [name] MRN: [id number] DOB: 04-22-1946   Cancelled Treatment:    Reason Eval/Treat Not Completed: Patient declined, no reason specified Pt stating she has no desire to walk more than 1x/day, will not demonstrate ambulating step prior to discharge and she stated she is aware of need to walk 3x/day but does not feel it pertains to her. Will attempt at later date. Encouraged attempt at walking in PM with nursing.   [name] 04/20/2013, 1:17 PM Elwyn Reach, [place]

## 2013-04-20 NOTE — Progress Notes (Signed)
[facility] ROUNDING NOTE   Subjective:   Interval History: Appears a little better sitting out of bed still dyspneic to conversation  Objective:  Vital signs in last 24 hours:  Temp:  [97.9 F (36.6 C)-99.3 F (37.4 C)] 97.9 F (36.6 C) (10/03 0500) Pulse Rate:  [59-67] 66 (10/03 0500) Resp:  [18] 18 (10/03 0500) BP: (106-157)/(46-76) 157/72 mmHg (10/03 0500) SpO2:  [94 %-97 %] 97 % (10/03 0500) Weight:  [87.544 kg (193 lb)] 87.544 kg (193 lb) (10/03 0500)  Weight change: -2.656 kg (-5 lb 13.7 oz) Filed Weights   04/18/13 0500 04/19/13 0409 04/20/13 0500  Weight: 87.9 kg (193 lb 12.6 oz) 90.2 kg (198 lb 13.7 oz) 87.544 kg (193 lb)    Intake/Output: I/O last 3 completed shifts: In: 480 [P.O.:480] Out: 1150 [Urine:1150]   Intake/Output this shift:  Total I/O In: 240 [P.O.:240] Out: -   CVS- RRR RS-decreased Breath sounds rales up tp mid back  ABD- BS present soft non-distended EXT- no edema   Basic Metabolic Panel:  Recent Labs Lab 04/13/13 1600  04/17/13 0400 04/17/13 1400 04/18/13 0420 04/19/13 0700 04/20/13 0550  NA  --   < > 141 139 140 140 140  K  --   < > 3.1* 3.5 3.7 3.3* 3.6  CL  --   < > 103 103 102 102 104  CO2  --   < > 25 24 22 23 23   GLUCOSE  --   < > 178* 202* 176* 101* 125*  BUN  --   < > 47* 45* 45* 46* 48*  CREATININE 1.61*  < > 2.05* 2.14* 2.12* 2.44* 2.55*  CALCIUM  --   < > 8.8 8.6 8.5 8.7 8.4  MG 2.3  --   --   --   --   --   --   PHOS  --   --   --  2.2*  --   --   --   < > = values in this interval not displayed.  Liver Function Tests:  Recent Labs Lab 04/15/13 0356 04/17/13 1400  AST 72*  --   ALT 97*  --   ALKPHOS 111  --   BILITOT 0.3  --   PROT 6.1  --   ALBUMIN 2.8* 2.5*   No results found for this basename: LIPASE, AMYLASE,  in the last 168 hours No results found for this basename: AMMONIA,  in the last 168 hours  CBC:  Recent Labs Lab 04/15/13 0356 04/16/13 0344 04/17/13 0400  04/18/13 0420 04/19/13 0700 04/20/13 0550  WBC 19.6* 13.3* 10.2 12.2* 15.6* 15.8*  NEUTROABS 15.6*  --   --   --   --   --   HGB 9.2* 8.2* 8.4* 8.4* 9.5* 8.6*  HCT 27.5* 24.5* 24.9* 24.1* 28.2* 26.7*  MCV 83.3 82.8 82.2 79.0 82.2 83.4  PLT 305 188 231 386 344 342    Cardiac Enzymes: No results found for this basename: CKTOTAL, CKMB, CKMBINDEX, TROPONINI,  in the last 168 hours  BNP: No components found with this basename: POCBNP,   CBG:  Recent Labs Lab 04/19/13 0649 04/19/13 1128 04/19/13 1633 04/19/13 2101 04/20/13 0609  GLUCAP 70 204* 169* 65 141*    Microbiology: Results for orders placed during the hospital encounter of 04/05/13  URINE CULTURE     Status: None   Collection Time    04/07/13  5:53 AM      Result Value Range Status  Specimen Description URINE, CLEAN CATCH   Final   Special Requests NONE   Final   Culture  Setup Time     Final   Value: 04/07/2013 15:27     Performed at [facility]     Final   Value: >=100,000 COLONIES/ML     Performed at [facility]   Culture     Final   Value: Multiple bacterial morphotypes present, none predominant. Suggest appropriate recollection if clinically indicated.     Performed at [facility]   Report Status 04/08/2013 FINAL   Final  SURGICAL PCR SCREEN     Status: None   Collection Time    04/07/13  4:45 PM      Result Value Range Status   MRSA, PCR NEGATIVE  NEGATIVE Final   Staphylococcus aureus NEGATIVE  NEGATIVE Final   Comment:            The Xpert SA Assay (FDA     approved for NASAL specimens     in patients over 53 years of age),     is one component of     a comprehensive surveillance     program.  Test performance has     been validated by [employer] for patients greater     than or equal to 27 year old.     It is not intended     to diagnose infection nor to     guide or monitor treatment.  URINE CULTURE     Status: None   Collection Time     04/14/13  9:57 PM      Result Value Range Status   Specimen Description URINE, CATHETERIZED   Final   Special Requests Immunocompromised   Final   Culture  Setup Time     Final   Value: 04/15/2013 03:26     Performed at [place]     Final   Value: NO GROWTH     Performed at [facility]   Culture     Final   Value: NO GROWTH     Performed at [facility]   Report Status 04/16/2013 FINAL   Final  CULTURE, BLOOD (SINGLE)     Status: None   Collection Time    04/14/13 10:38 PM      Result Value Range Status   Specimen Description BLOOD RIGHT HAND   Final   Special Requests BOTTLES DRAWN AEROBIC AND ANAEROBIC 2CC   Final   Culture  Setup Time     Final   Value: 04/15/2013 02:58     Performed at [facility]   Culture     Final   Value:        BLOOD CULTURE RECEIVED NO GROWTH TO DATE CULTURE WILL BE HELD FOR 5 DAYS BEFORE ISSUING A FINAL NEGATIVE REPORT     Performed at [facility]   Report Status PENDING   Incomplete  CULTURE, RESPIRATORY (NON-EXPECTORATED)     Status: None   Collection Time    04/15/13  1:30 PM      Result Value Range Status   Specimen Description TRACHEAL ASPIRATE   Final   Special Requests Immunocompromised   Final   Gram Stain     Final   Value: RARE WBC PRESENT, PREDOMINANTLY PMN     NO SQUAMOUS EPITHELIAL CELLS SEEN  NO ORGANISMS SEEN     Performed at [facility]   Culture     Final   Value: NO GROWTH 2 DAYS     Performed at [facility]   Report Status 04/18/2013 FINAL   Final  CLOSTRIDIUM DIFFICILE BY PCR     Status: None   Collection Time    04/19/13 12:20 PM      Result Value Range Status   C difficile by pcr NEGATIVE  NEGATIVE Final    Coagulation Studies:  Recent Labs  04/18/13 0420 04/19/13 0700 04/20/13 0550  LABPROT 24.0* 25.9* 21.8*  INR 2.23* 2.47* 1.97*    Urinalysis: No results found for this basename: COLORURINE, APPERANCEUR, LABSPEC, PHURINE,  GLUCOSEU, HGBUR, BILIRUBINUR, KETONESUR, PROTEINUR, UROBILINOGEN, NITRITE, LEUKOCYTESUR,  in the last 72 hours    Imaging: Dg Chest 2 View  04/20/2013   CLINICAL DATA:  Status post CABG.  EXAM: CHEST  2 VIEW  COMPARISON:  04/18/2013  FINDINGS: The cardio pericardial silhouette is enlarged. There is bibasilar atelectasis, improved on the left, with tiny bilateral pleural effusions. Pulmonary vascular congestion again noted without overt airspace pulmonary edema. Curvilinear lucencies superimposed on the lower heart is unchanged since 04/16/2013 and may represent areas of aerated lung in the posterior medial lung bases as seen on the CT scan from 04/15/2013. Telemetry leads overlie the chest.  IMPRESSION: Improving aeration at the left base with persistent bibasilar atelectasis and tiny effusions.   Electronically Signed   By: Misty Stanley M.D.   On: 04/20/2013 08:01     Medications:     . amiodarone  200 mg Oral BID PC  . cinacalcet  60 mg Oral Q48H  . dapsone  25 mg Oral Daily  . docusate sodium  200 mg Oral Daily  . furosemide  80 mg Intravenous Q6H  . glipiZIDE  10 mg Oral BID AC  . insulin aspart  0-20 Units Subcutaneous TID WC  . insulin aspart  0-5 Units Subcutaneous QHS  . metolazone  10 mg Oral Once  . metoprolol tartrate  25 mg Oral BID  . mycophenolate  360 mg Oral BID  . pantoprazole (PROTONIX) IV  40 mg Intravenous Q24H  . piperacillin-tazobactam (ZOSYN)  IV  3.375 g Intravenous Q8H  . potassium chloride  20 mEq Oral BID  . sodium chloride  3 mL Intravenous Q12H  . sodium chloride  3 mL Intravenous Q12H  . tacrolimus  5 mg Oral BID  . warfarin  2 mg Oral q1800  . Warfarin - Physician Dosing Inpatient   Does not apply q1800   sodium chloride, diphenhydrAMINE, fentaNYL, labetalol, ondansetron (ZOFRAN) IV, sodium chloride, sodium chloride  Assessment/ Plan:  1.Acute on chronic kidney disease: patient with Cr trended back towards baseline following heart cath early in  hospitalization and had been stable until the day of her operation.  - patient does not seem to have indications for emergent dialysis at this time  - will continue IV lasix and will give x1 dose of metolazone - avoid nephrotoxic agents  2. 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 Had only been on prograf here. Most recent prograf level was 5.4 in early September.  - will switch to PO meds      LOS: 15 [name] W @TODAY @10 :57 AM

## 2013-04-20 NOTE — Progress Notes (Addendum)
[address]       [place] [address]             [phone]      8 Days Post-Op  Procedure(s) (LRB): AORTIC VALVE REPLACEMENT (AVR) (N/A) MAZE (N/A) INTRAOPERATIVE TRANSESOPHAGEAL ECHOCARDIOGRAM (N/A) Subjective: Feels better, diarrhea has slowed, cdiff is negative  Objective  Telemetry sinus rhythm  Temp:  [97.9 F (36.6 C)-99.3 F (37.4 C)] 97.9 F (36.6 C) (10/03 0500) Pulse Rate:  [59-67] 66 (10/03 0500) Resp:  [18] 18 (10/03 0500) BP: (106-157)/(46-79) 157/72 mmHg (10/03 0500) SpO2:  [94 %-97 %] 97 % (10/03 0500) Weight:  [193 lb (87.544 kg)] 193 lb (87.544 kg) (10/03 0500)   Intake/Output Summary (Last 24 hours) at 04/20/13 0738 Last data filed at 04/20/13 0531  Gross per 24 hour  Intake    480 ml  Output    600 ml  Net   -120 ml       General appearance: alert, cooperative and no distress Heart: regular rate and rhythm Lungs: dim in bases Abdomen: soft, nontender, nondistended Extremities: edema conts to improve Wound: incis healing well  Lab Results:  Recent Labs  04/17/13 1400  04/19/13 0700 04/20/13 0550  NA 139  < > 140 140  K 3.5  < > 3.3* 3.6  CL 103  < > 102 104  CO2 24  < > 23 23  GLUCOSE 202*  < > 101* 125*  BUN 45*  < > 46* 48*  CREATININE 2.14*  < > 2.44* 2.55*  CALCIUM 8.6  < > 8.7 8.4  PHOS 2.2*  --   --   --   < > = values in this interval not displayed.  Recent Labs  04/17/13 1400  ALBUMIN 2.5*   No results found for this basename: LIPASE, AMYLASE,  in the last 72 hours  Recent Labs  04/19/13 0700 04/20/13 0550  WBC 15.6* 15.8*  HGB 9.5* 8.6*  HCT 28.2* 26.7*  MCV 82.2 83.4  PLT 344 342   No results found for this basename: CKTOTAL, CKMB, TROPONINI,  in the last 72 hours No components found with this basename: POCBNP,  No results found for this basename: DDIMER,  in the last 72 hours No results found for this basename: HGBA1C,  in the last 72 hours No results found for this basename:  CHOL, HDL, LDLCALC, TRIG, CHOLHDL,  in the last 72 hours No results found for this basename: TSH, T4TOTAL, FREET3, T3FREE, THYROIDAB,  in the last 72 hours No results found for this basename: VITAMINB12, FOLATE, FERRITIN, TIBC, IRON, RETICCTPCT,  in the last 72 hours  Medications: Scheduled . amiodarone  200 mg Oral BID PC  . cinacalcet  60 mg Oral Q48H  . dapsone  25 mg Oral Daily  . docusate sodium  200 mg Oral Daily  . furosemide  80 mg Intravenous Q6H  . glipiZIDE  10 mg Oral BID AC  . insulin aspart  0-20 Units Subcutaneous TID WC  . insulin aspart  0-5 Units Subcutaneous QHS  . insulin detemir  10 Units Subcutaneous BID  . metoprolol tartrate  25 mg Oral BID  . mycophenolate  500 mg Oral BID  . pantoprazole (PROTONIX) IV  40 mg Intravenous Q24H  . piperacillin-tazobactam (ZOSYN)  IV  3.375 g Intravenous Q8H  . potassium chloride  20 mEq Oral BID  . sodium chloride  3 mL Intravenous Q12H  . sodium chloride  3 mL  Intravenous Q12H  . tacrolimus  5 mg Oral BID  . warfarin  2 mg Oral q1800  . Warfarin - Physician Dosing Inpatient   Does not apply q1800     Radiology/Studies:  No results found.  INR:1.97 Will add last result for INR, ABG once components are confirmed Will add last 4 CBG results once components are confirmed  Assessment/Plan: S/P Procedure(s) (LRB): AORTIC VALVE REPLACEMENT (AVR) (N/A) MAZE (N/A) INTRAOPERATIVE TRANSESOPHAGEAL ECHOCARDIOGRAM (N/A)  1 Creat rising, nephrology following, much less volume overloaded. 2 hopefully diarrhea will cont to resolve 3 I think we can stop levemir , cont glucotrol 4 Leukocytosis stable, await radiology read on CXR, may be able to stop abx 5 cont current coumadin dose 6 sinus rhythm on current rx    LOS: 15 days    GOLD,WAYNE E 10/3/20147:38 AM  1800- Patient says she has had 4 more watery BM today. Also wants to put alcohol on her legs to make the swelling go away.  c diff negative x 1- will repeat Imodium  prn for diarrhea

## 2013-04-20 NOTE — Progress Notes (Signed)
ANTIBIOTIC CONSULT NOTE - FOLLOW UP  Pharmacy Consult for Vancomycin and zosyn Indication: pneumonia   Recent Labs  04/19/13 1300 04/20/13 0550  VANCOTROUGH 32.8*  --   VANCORANDOM  --  25.8    Assessment:   67 yo F on post op day # 8 s/p AVR, MAZE procedure on vancomycin and zosyn.  WBC 15.8, tmax 99.3, c diff negative.  CXR 10/3 :Improving aeration at the left base with persistent bibasilar atelectasis and tiny effusions.  Creat up a little to 2.44.    Last dose of vancomycin was 10/1 at 1217.  His vanc trough was 32.8 mcg/ml no 10/2 and vanc random this am is 25.8 mcg/ml.  He is not clearing vancomycin.  vanc 9/27>> Zosyn 9/28>   9/28 TA - ngf 9/27 BCx1 - ngtd 9/27 UCx - neg 10/2 c diff neg  Plan -continue to hold scheduled doses ofvancomycin for now -re-Check random vancomycin level again in the AM -decrease zosyn from 3.375 gm IV q8h over 4 hour infusion to zosyn 2.25 gm IV q8h over 30 minutes -f/u length of abx therapy,  Eudelia Bunch, Pharm.D. [id number] 04/20/2013 11:55 AM

## 2013-04-20 NOTE — Progress Notes (Signed)
CARDIAC REHAB PHASE I   PRE:  Rate/Rhythm: 70 SR    BP: sitting 140/80    SaO2: 97 RA  MODE:  Ambulation: 150 ft   POST:  Rate/Rhythm: 76 SR    BP: sitting 150/76     SaO2: 95 RA  Pt reluctant to walk due to consistent stools. Showed pt how to get out of chair without using arms as she sts she is having to use arms at night due to stool urgency. Able to stand almost independently. Used RW and assist x2 (supervision). Continues to be DOE. Return to recliner. Did not want to increase distance. Encouraged pt to walk more today and try to encourage distance.  [place], [place], ACSM 04/20/2013 12:08 PM     70

## 2013-04-21 LAB — CULTURE, BLOOD (SINGLE): Culture: NO GROWTH

## 2013-04-21 LAB — GLUCOSE, CAPILLARY

## 2013-04-21 LAB — TACROLIMUS LEVEL: Tacrolimus (FK506) - LabCorp: 14.2 ng/mL

## 2013-04-21 LAB — IRON AND TIBC
Iron: 37 ug/dL — ABNORMAL LOW (ref 42–135)
TIBC: 179 ug/dL — ABNORMAL LOW (ref 250–470)

## 2013-04-21 MED ORDER — DARBEPOETIN ALFA-POLYSORBATE 60 MCG/0.3ML IJ SOLN
60.0000 ug | Freq: Once | INTRAMUSCULAR | Status: AC
  Administered 2013-04-21: 60 ug via SUBCUTANEOUS
  Filled 2013-04-21: qty 0.3

## 2013-04-21 MED ORDER — WARFARIN SODIUM 4 MG PO TABS
4.0000 mg | ORAL_TABLET | Freq: Every day | ORAL | Status: DC
  Administered 2013-04-21 – 2013-04-22 (×2): 4 mg via ORAL
  Filled 2013-04-21 (×3): qty 1

## 2013-04-21 MED ORDER — DARBEPOETIN ALFA-POLYSORBATE 60 MCG/0.3ML IJ SOLN
60.0000 ug | Freq: Once | INTRAMUSCULAR | Status: DC
  Filled 2013-04-21: qty 0.3

## 2013-04-21 MED ORDER — METOLAZONE 10 MG PO TABS
10.0000 mg | ORAL_TABLET | Freq: Every day | ORAL | Status: AC
  Administered 2013-04-21: 10 mg via ORAL
  Filled 2013-04-21: qty 1
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
## 2013-04-21 NOTE — Progress Notes (Deleted)
Pt did not wish to ambulate. BP 83/62. HR 90's to 150's A-fib/flutter with occasional SR beat. Amiodarone dose given early D/T patients HR staying mostly in the 130's. Will continue to monitor.

## 2013-04-21 NOTE — Progress Notes (Addendum)
[address]       [place] [address]             [phone]      9 Days Post-Op  Procedure(s) (LRB): AORTIC VALVE REPLACEMENT (AVR) (N/A) MAZE (N/A) INTRAOPERATIVE TRANSESOPHAGEAL ECHOCARDIOGRAM (N/A) Subjective: Feels better each day, breathing is mostly comfortable  Objective  Telemetry sinus rhythm  Temp:  [98 F (36.7 C)-98.5 F (36.9 C)] 98.3 F (36.8 C) (10/04 0547) Pulse Rate:  [64-73] 73 (10/04 0547) Resp:  [18-19] 19 (10/04 0547) BP: (115-145)/(56-74) 138/73 mmHg (10/04 0547) SpO2:  [93 %-100 %] 93 % (10/04 0547) Weight:  [192 lb 12.8 oz (87.454 kg)] 192 lb 12.8 oz (87.454 kg) (10/04 0547)   Intake/Output Summary (Last 24 hours) at 04/21/13 0844 Last data filed at 04/21/13 0700  Gross per 24 hour  Intake    830 ml  Output   2705 ml  Net  -1875 ml       General appearance: alert, cooperative and no distress Heart: regular rate and rhythm and + systolic murmur Lungs: mildly dim in bases Abdomen: benign Extremities: + edema but appears to be cont to improve Wound: incisions healing well  Lab Results:  Recent Labs  04/19/13 0700 04/20/13 0550  NA 140 140  K 3.3* 3.6  CL 102 104  CO2 23 23  GLUCOSE 101* 125*  BUN 46* 48*  CREATININE 2.44* 2.55*  CALCIUM 8.7 8.4   No results found for this basename: AST, ALT, ALKPHOS, BILITOT, PROT, ALBUMIN,  in the last 72 hours No results found for this basename: LIPASE, AMYLASE,  in the last 72 hours  Recent Labs  04/19/13 0700 04/20/13 0550  WBC 15.6* 15.8*  HGB 9.5* 8.6*  HCT 28.2* 26.7*  MCV 82.2 83.4  PLT 344 342   No results found for this basename: CKTOTAL, CKMB, TROPONINI,  in the last 72 hours No components found with this basename: POCBNP,  No results found for this basename: DDIMER,  in the last 72 hours No results found for this basename: HGBA1C,  in the last 72 hours No results found for this basename: CHOL, HDL, LDLCALC, TRIG, CHOLHDL,  in the last 72 hours No  results found for this basename: TSH, T4TOTAL, FREET3, T3FREE, THYROIDAB,  in the last 72 hours No results found for this basename: VITAMINB12, FOLATE, FERRITIN, TIBC, IRON, RETICCTPCT,  in the last 72 hours  Medications: Scheduled . amiodarone  200 mg Oral BID PC  . cinacalcet  60 mg Oral Q48H  . dapsone  25 mg Oral Daily  . docusate sodium  200 mg Oral Daily  . furosemide  80 mg Intravenous Q6H  . glipiZIDE  10 mg Oral BID AC  . insulin aspart  0-20 Units Subcutaneous TID WC  . insulin aspart  0-5 Units Subcutaneous QHS  . metoprolol tartrate  25 mg Oral BID  . mycophenolate  360 mg Oral BID  . pantoprazole  40 mg Oral QHS  . piperacillin-tazobactam (ZOSYN)  IV  2.25 g Intravenous Q8H  . potassium chloride  20 mEq Oral BID  . sodium chloride  3 mL Intravenous Q12H  . sodium chloride  3 mL Intravenous Q12H  . tacrolimus  5 mg Oral BID  . warfarin  2 mg Oral q1800  . Warfarin - Physician Dosing Inpatient   Does not apply q1800     Radiology/Studies:  Dg Chest 2 View  04/20/2013   CLINICAL DATA:  Status  post CABG.  EXAM: CHEST  2 VIEW  COMPARISON:  04/18/2013  FINDINGS: The cardio pericardial silhouette is enlarged. There is bibasilar atelectasis, improved on the left, with tiny bilateral pleural effusions. Pulmonary vascular congestion again noted without overt airspace pulmonary edema. Curvilinear lucencies superimposed on the lower heart is unchanged since 04/16/2013 and may represent areas of aerated lung in the posterior medial lung bases as seen on the CT scan from 04/15/2013. Telemetry leads overlie the chest.  IMPRESSION: Improving aeration at the left base with persistent bibasilar atelectasis and tiny effusions.   Electronically Signed   By: Misty Stanley M.D.   On: 04/20/2013 08:01    INR:1.76 Will add last result for INR, ABG once components are confirmed Will add last 4 CBG results once components are confirmed  Assessment/Plan: S/P Procedure(s) (LRB): AORTIC VALVE  REPLACEMENT (AVR) (N/A) MAZE (N/A) INTRAOPERATIVE TRANSESOPHAGEAL ECHOCARDIOGRAM (N/A)  1 INR decreasing - will increase coumadin dose 2 no new labs- nephrology managing diuretics 3 diarrhea mostly resolved 4 sugars with pretty good control- monitor on glucotrol 5 poss d/c abx at this point or convert to po for resolving pneumonia 6 rhythm stable  LOS: 16 days    GOLD,WAYNE E 10/4/20148:44 AM  Diarrhea better but not resolved- imodium prn Will dc antibiotics at this point and observe Recheck creatinine in AM

## 2013-04-21 NOTE — Progress Notes (Signed)
Pt did not wish to ambulate this evening d/t her episodes of diarrhea. She understands the need for ambulation and says she will try tomorrow.

## 2013-04-21 NOTE — Progress Notes (Signed)
[facility] ROUNDING NOTE   Subjective:   Interval History:  Stable patient continues to diurese  Objective:  Vital signs in last 24 hours:  Temp:  [98 F (36.7 C)-98.5 F (36.9 C)] 98.3 F (36.8 C) (10/04 0547) Pulse Rate:  [64-73] 73 (10/04 0547) Resp:  [18-19] 19 (10/04 0547) BP: (115-145)/(56-74) 138/73 mmHg (10/04 0547) SpO2:  [93 %-100 %] 93 % (10/04 0547) Weight:  [87.454 kg (192 lb 12.8 oz)] 87.454 kg (192 lb 12.8 oz) (10/04 0547)  Weight change: -0.091 kg (-3.2 oz) Filed Weights   04/19/13 0409 04/20/13 0500 04/21/13 0547  Weight: 90.2 kg (198 lb 13.7 oz) 87.544 kg (193 lb) 87.454 kg (192 lb 12.8 oz)    Intake/Output: I/O last 3 completed shifts: In: 1070 [P.O.:720; IV Piggyback:350] Out: [id number] [Urine:3100; Stool:5]   Intake/Output this shift:     CVS- RRR  RS-decreased Breath sounds rales up tp mid back  ABD- BS present soft non-distended  EXT- no edema    Basic Metabolic Panel:  Recent Labs Lab 04/17/13 0400 04/17/13 1400 04/18/13 0420 04/19/13 0700 04/20/13 0550  NA 141 139 140 140 140  K 3.1* 3.5 3.7 3.3* 3.6  CL 103 103 102 102 104  CO2 25 24 22 23 23   GLUCOSE 178* 202* 176* 101* 125*  BUN 47* 45* 45* 46* 48*  CREATININE 2.05* 2.14* 2.12* 2.44* 2.55*  CALCIUM 8.8 8.6 8.5 8.7 8.4  PHOS  --  2.2*  --   --   --     Liver Function Tests:  Recent Labs Lab 04/15/13 0356 04/17/13 1400  AST 72*  --   ALT 97*  --   ALKPHOS 111  --   BILITOT 0.3  --   PROT 6.1  --   ALBUMIN 2.8* 2.5*   No results found for this basename: LIPASE, AMYLASE,  in the last 168 hours No results found for this basename: AMMONIA,  in the last 168 hours  CBC:  Recent Labs Lab 04/15/13 0356 04/16/13 0344 04/17/13 0400 04/18/13 0420 04/19/13 0700 04/20/13 0550  WBC 19.6* 13.3* 10.2 12.2* 15.6* 15.8*  NEUTROABS 15.6*  --   --   --   --   --   HGB 9.2* 8.2* 8.4* 8.4* 9.5* 8.6*  HCT 27.5* 24.5* 24.9* 24.1* 28.2* 26.7*  MCV 83.3 82.8 82.2  79.0 82.2 83.4  PLT 305 188 231 386 344 342    Cardiac Enzymes: No results found for this basename: CKTOTAL, CKMB, CKMBINDEX, TROPONINI,  in the last 168 hours  BNP: No components found with this basename: POCBNP,   CBG:  Recent Labs Lab 04/19/13 1633 04/19/13 2101 04/20/13 0609 04/20/13 2113 04/21/13 0606  GLUCAP 169* 85 141* 194* 123*    Microbiology: Results for orders placed during the hospital encounter of 04/05/13  URINE CULTURE     Status: None   Collection Time    04/07/13  5:53 AM      Result Value Range Status   Specimen Description URINE, CLEAN CATCH   Final   Special Requests NONE   Final   Culture  Setup Time     Final   Value: 04/07/2013 15:27     Performed at [facility]     Final   Value: >=100,000 COLONIES/ML     Performed at [facility]   Culture     Final   Value: Multiple bacterial morphotypes present, none predominant. Suggest appropriate recollection if clinically  indicated.     Performed at [facility]   Report Status 04/08/2013 FINAL   Final  SURGICAL PCR SCREEN     Status: None   Collection Time    04/07/13  4:45 PM      Result Value Range Status   MRSA, PCR NEGATIVE  NEGATIVE Final   Staphylococcus aureus NEGATIVE  NEGATIVE Final   Comment:            The Xpert SA Assay (FDA     approved for NASAL specimens     in patients over 61 years of age),     is one component of     a comprehensive surveillance     program.  Test performance has     been validated by [employer] for patients greater     than or equal to 72 year old.     It is not intended     to diagnose infection nor to     guide or monitor treatment.  URINE CULTURE     Status: None   Collection Time    04/14/13  9:57 PM      Result Value Range Status   Specimen Description URINE, CATHETERIZED   Final   Special Requests Immunocompromised   Final   Culture  Setup Time     Final   Value: 04/15/2013 03:26     Performed at  [facility]     Final   Value: NO GROWTH     Performed at [facility]   Culture     Final   Value: NO GROWTH     Performed at [facility]   Report Status 04/16/2013 FINAL   Final  CULTURE, BLOOD (SINGLE)     Status: None   Collection Time    04/14/13 10:38 PM      Result Value Range Status   Specimen Description BLOOD RIGHT HAND   Final   Special Requests BOTTLES DRAWN AEROBIC AND ANAEROBIC 2CC   Final   Culture  Setup Time     Final   Value: 04/15/2013 02:58     Performed at [facility]   Culture     Final   Value:        BLOOD CULTURE RECEIVED NO GROWTH TO DATE CULTURE WILL BE HELD FOR 5 DAYS BEFORE ISSUING A FINAL NEGATIVE REPORT     Performed at [facility]   Report Status PENDING   Incomplete  CULTURE, RESPIRATORY (NON-EXPECTORATED)     Status: None   Collection Time    04/15/13  1:30 PM      Result Value Range Status   Specimen Description TRACHEAL ASPIRATE   Final   Special Requests Immunocompromised   Final   Gram Stain     Final   Value: RARE WBC PRESENT, PREDOMINANTLY PMN     NO SQUAMOUS EPITHELIAL CELLS SEEN     NO ORGANISMS SEEN     Performed at [facility]   Culture     Final   Value: NO GROWTH 2 DAYS     Performed at [facility]   Report Status 04/18/2013 FINAL   Final  CLOSTRIDIUM DIFFICILE BY PCR     Status: None   Collection Time    04/19/13 12:20 PM      Result Value Range Status   C difficile by pcr NEGATIVE  NEGATIVE Final  Coagulation Studies:  Recent Labs  04/19/13 0700 04/20/13 0550 04/21/13 0620  LABPROT 25.9* 21.8* 20.0*  INR 2.47* 1.97* 1.76*    Urinalysis: No results found for this basename: COLORURINE, APPERANCEUR, LABSPEC, PHURINE, GLUCOSEU, HGBUR, BILIRUBINUR, KETONESUR, PROTEINUR, UROBILINOGEN, NITRITE, LEUKOCYTESUR,  in the last 72 hours    Imaging: Dg Chest 2 View  04/20/2013   CLINICAL DATA:  Status post CABG.  EXAM: CHEST  2 VIEW  COMPARISON:   04/18/2013  FINDINGS: The cardio pericardial silhouette is enlarged. There is bibasilar atelectasis, improved on the left, with tiny bilateral pleural effusions. Pulmonary vascular congestion again noted without overt airspace pulmonary edema. Curvilinear lucencies superimposed on the lower heart is unchanged since 04/16/2013 and may represent areas of aerated lung in the posterior medial lung bases as seen on the CT scan from 04/15/2013. Telemetry leads overlie the chest.  IMPRESSION: Improving aeration at the left base with persistent bibasilar atelectasis and tiny effusions.   Electronically Signed   By: Misty Stanley M.D.   On: 04/20/2013 08:01     Medications:     . amiodarone  200 mg Oral BID PC  . cinacalcet  60 mg Oral Q48H  . dapsone  25 mg Oral Daily  . docusate sodium  200 mg Oral Daily  . furosemide  80 mg Intravenous Q6H  . glipiZIDE  10 mg Oral BID AC  . insulin aspart  0-20 Units Subcutaneous TID WC  . insulin aspart  0-5 Units Subcutaneous QHS  . metolazone  10 mg Oral Daily  . metoprolol tartrate  25 mg Oral BID  . mycophenolate  360 mg Oral BID  . pantoprazole  40 mg Oral QHS  . piperacillin-tazobactam (ZOSYN)  IV  2.25 g Intravenous Q8H  . potassium chloride  20 mEq Oral BID  . sodium chloride  3 mL Intravenous Q12H  . sodium chloride  3 mL Intravenous Q12H  . tacrolimus  5 mg Oral BID  . warfarin  4 mg Oral q1800  . Warfarin - Physician Dosing Inpatient   Does not apply q1800   sodium chloride, diphenhydrAMINE, fentaNYL, labetalol, loperamide, ondansetron (ZOFRAN) IV, sodium chloride, sodium chloride  Assessment/ Plan:  1.Acute on chronic kidney disease: patient with Cr appears stable following heart cath early in hospitalization and had been stable until the day of her operation.  - will continue IV lasix and will another x1 dose of metolazone 10mg  - avoid nephrotoxic agents  2. 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 Had  only been on prograf here. Most recent prograf level was 5.4 in early September.  - will switch to PO meds 3. Anemic  - Check iron and TIBC - Will give an aranesp injection today    LOS: 16 [name] W @TODAY @9 :34 AM

## 2013-04-22 LAB — URINALYSIS, ROUTINE W REFLEX MICROSCOPIC
Nitrite: NEGATIVE
Specific Gravity, Urine: 1.01 (ref 1.005–1.030)
pH: 6.5 (ref 5.0–8.0)

## 2013-04-22 LAB — RENAL FUNCTION PANEL
Albumin: 2.5 g/dL — ABNORMAL LOW (ref 3.5–5.2)
CO2: 26 mEq/L (ref 19–32)
Chloride: 93 mEq/L — ABNORMAL LOW (ref 96–112)
Creatinine, Ser: 2.51 mg/dL — ABNORMAL HIGH (ref 0.50–1.10)
GFR calc Af Amer: 21 mL/min — ABNORMAL LOW (ref >90)
GFR calc non Af Amer: 18 mL/min — ABNORMAL LOW (ref >90)
Potassium: 3.5 mEq/L (ref 3.5–5.1)

## 2013-04-22 LAB — GLUCOSE, CAPILLARY
Glucose-Capillary: 145 mg/dL — ABNORMAL HIGH (ref 70–99)
Glucose-Capillary: 136 mg/dL — ABNORMAL HIGH (ref 70–99)

## 2013-04-22 LAB — CBC
HCT: 26.2 % — ABNORMAL LOW (ref 36.0–46.0)
Hemoglobin: 9.6 g/dL — ABNORMAL LOW (ref 12.0–15.0)
MCH: 30.7 pg (ref 26.0–34.0)
RBC: 3.13 MIL/uL — ABNORMAL LOW (ref 3.87–5.11)
WBC: 13.9 10*3/uL — ABNORMAL HIGH (ref 4.0–10.5)

## 2013-04-22 LAB — URINE MICROSCOPIC-ADD ON

## 2013-04-22 LAB — PROTIME-INR: INR: 1.7 — ABNORMAL HIGH (ref 0.00–1.49)

## 2013-04-22 MED ORDER — MAGNESIUM OXIDE 400 (241.3 MG) MG PO TABS
400.0000 mg | ORAL_TABLET | Freq: Two times a day (BID) | ORAL | Status: DC
  Administered 2013-04-22: 400 mg via ORAL
  Filled 2013-04-22 (×2): qty 1

## 2013-04-22 MED ORDER — METOLAZONE 10 MG PO TABS
10.0000 mg | ORAL_TABLET | Freq: Every day | ORAL | Status: AC
  Administered 2013-04-22: 10 mg via ORAL
  Filled 2013-04-22: qty 1

## 2013-04-22 MED ORDER — OXYCODONE HCL 5 MG PO TABS
5.0000 mg | ORAL_TABLET | ORAL | Status: DC | PRN
  Administered 2013-04-22 (×2): 5 mg via ORAL
  Administered 2013-04-22 – 2013-04-23 (×4): 10 mg via ORAL
  Filled 2013-04-22: qty 1
  Filled 2013-04-22 (×2): qty 2
  Filled 2013-04-22: qty 1
  Filled 2013-04-22 (×2): qty 2

## 2013-04-22 MED ORDER — MAGNESIUM OXIDE 400 (241.3 MG) MG PO TABS
400.0000 mg | ORAL_TABLET | Freq: Every day | ORAL | Status: DC
  Filled 2013-04-22: qty 1

## 2013-04-22 NOTE — Progress Notes (Signed)
[address]       [place] [address]             [phone]      10 Days Post-Op  Procedure(s) (LRB): AORTIC VALVE REPLACEMENT (AVR) (N/A) MAZE (N/A) INTRAOPERATIVE TRANSESOPHAGEAL ECHOCARDIOGRAM (N/A) Subjective: conts to feel better, diarrhea conts to improve  Objective  Telemetry sinus rhythm   Temp:  [98.4 F (36.9 C)-99.1 F (37.3 C)] 98.5 F (36.9 C) (10/05 0419) Pulse Rate:  [69-75] 69 (10/05 0419) Resp:  [17-19] 17 (10/05 0419) BP: (117-128)/(60-79) 128/79 mmHg (10/05 0419) SpO2:  [95 %-97 %] 95 % (10/05 0419) Weight:  [186 lb 8 oz (84.596 kg)] 186 lb 8 oz (84.596 kg) (10/05 0419)   Intake/Output Summary (Last 24 hours) at 04/22/13 0811 Last data filed at 04/22/13 0420  Gross per 24 hour  Intake    360 ml  Output   1802 ml  Net  -1442 ml       General appearance: alert, cooperative and no distress Heart: regular rate and rhythm and 2/6 systolic murmur Lungs: mild dim in bases Abdomen: soft, nontender Extremities: edema conts to lessen Wound: incisions healing well  Lab Results:  Recent Labs  04/20/13 0550 04/21/13 1130 04/22/13 0500  NA 140  --  135  K 3.6  --  3.5  CL 104  --  93*  CO2 23  --  26  GLUCOSE 125*  --  161*  BUN 48*  --  54*  CREATININE 2.55*  --  2.51*  CALCIUM 8.4  --  8.5  MG  --  1.2*  --   PHOS  --   --  3.7    Recent Labs  04/22/13 0500  ALBUMIN 2.5*   No results found for this basename: LIPASE, AMYLASE,  in the last 72 hours  Recent Labs  04/20/13 0550 04/22/13 0500  WBC 15.8* 13.9*  HGB 8.6* 9.6*  HCT 26.7* 26.2*  MCV 83.4 83.7  PLT 342 316   No results found for this basename: CKTOTAL, CKMB, TROPONINI,  in the last 72 hours No components found with this basename: POCBNP,  No results found for this basename: DDIMER,  in the last 72 hours No results found for this basename: HGBA1C,  in the last 72 hours No results found for this basename: CHOL, HDL, LDLCALC, TRIG, CHOLHDL,   in the last 72 hours No results found for this basename: TSH, T4TOTAL, FREET3, T3FREE, THYROIDAB,  in the last 72 hours  Recent Labs  04/21/13 1130  TIBC 179*  IRON 37*    Medications: Scheduled . amiodarone  200 mg Oral BID PC  . cinacalcet  60 mg Oral Q48H  . dapsone  25 mg Oral Daily  . docusate sodium  200 mg Oral Daily  . furosemide  80 mg Intravenous Q6H  . glipiZIDE  10 mg Oral BID AC  . insulin aspart  0-20 Units Subcutaneous TID WC  . insulin aspart  0-5 Units Subcutaneous QHS  . metoprolol tartrate  25 mg Oral BID  . mycophenolate  360 mg Oral BID  . pantoprazole  40 mg Oral QHS  . potassium chloride  20 mEq Oral BID  . sodium chloride  3 mL Intravenous Q12H  . sodium chloride  3 mL Intravenous Q12H  . tacrolimus  5 mg Oral BID  . warfarin  4 mg Oral q1800  . Warfarin - Physician Dosing Inpatient   Does not apply  [id number]     Radiology/Studies:  No results found.  INR:1.7 Will add last result for INR, ABG once components are confirmed Will add last 4 CBG results once components are confirmed  Assessment/Plan: S/P Procedure(s) (LRB): AORTIC VALVE REPLACEMENT (AVR) (N/A) MAZE (N/A) INTRAOPERATIVE TRANSESOPHAGEAL ECHOCARDIOGRAM (N/A)  1 Looks and feels better 2 magnesium 1.2 will give some replacement 3 diuretic and renal management as per nephrology, creat stable, K+ 3.5- receiving supplementation 4 prn imodium- less stooling trend conts 5 sugars reasonably well controlled, one reading 219 6 low iron/anemia being managed by nephrology 7 INR 1.7 - will cont 4mg  coumadin for now,(dose increased yesterday) 8 poss d/c in next 24 hours if all agree  LOS: 17 days    Rachelanne Whidby E 10/5/20148:11 AM

## 2013-04-22 NOTE — Plan of Care (Signed)
Problem: Phase III Progression Outcomes Goal: Time patient transferred to PCTU/Telemetry POD Outcome: Completed/Met Date Met:  04/18/13 1206

## 2013-04-22 NOTE — Progress Notes (Addendum)
[name] ROUNDING NOTE   Subjective:   Interval History: breathing significantly improved  Objective:  Vital signs in last 24 hours:  Temp:  [98.4 F (36.9 C)-99.1 F (37.3 C)] 98.5 F (36.9 C) (10/05 0419) Pulse Rate:  [69-75] 69 (10/05 0419) Resp:  [17-19] 17 (10/05 0419) BP: (117-128)/(60-79) 128/79 mmHg (10/05 0419) SpO2:  [95 %-97 %] 95 % (10/05 0419) Weight:  [84.596 kg (186 lb 8 oz)] 84.596 kg (186 lb 8 oz) (10/05 0419)  Weight change: -2.858 kg (-6 lb 4.8 oz) Filed Weights   04/20/13 0500 04/21/13 0547 04/22/13 0419  Weight: 87.544 kg (193 lb) 87.454 kg (192 lb 12.8 oz) 84.596 kg (186 lb 8 oz)    Intake/Output: I/O last 3 completed shifts: In: 720 [P.O.:720] Out: 3653 [Urine:3650; Stool:3]   Intake/Output this shift:     CVS- RRR  RS-decreased Breath sounds rales up tp mid back  ABD- BS present soft non-distended  EXT- no edema    Basic Metabolic Panel:  Recent Labs Lab 04/17/13 1400 04/18/13 0420 04/19/13 0700 04/20/13 0550 04/21/13 1130 04/22/13 0500  NA 139 140 140 140  --  135  K 3.5 3.7 3.3* 3.6  --  3.5  CL 103 102 102 104  --  93*  CO2 24 22 23 23   --  26  GLUCOSE 202* 176* 101* 125*  --  161*  BUN 45* 45* 46* 48*  --  54*  CREATININE 2.14* 2.12* 2.44* 2.55*  --  2.51*  CALCIUM 8.6 8.5 8.7 8.4  --  8.5  MG  --   --   --   --  1.2*  --   PHOS 2.2*  --   --   --   --  3.7    Liver Function Tests:  Recent Labs Lab 04/17/13 1400 04/22/13 0500  ALBUMIN 2.5* 2.5*   No results found for this basename: LIPASE, AMYLASE,  in the last 168 hours No results found for this basename: AMMONIA,  in the last 168 hours  CBC:  Recent Labs Lab 04/17/13 0400 04/18/13 0420 04/19/13 0700 04/20/13 0550 04/22/13 0500  WBC 10.2 12.2* 15.6* 15.8* 13.9*  HGB 8.4* 8.4* 9.5* 8.6* 9.6*  HCT 24.9* 24.1* 28.2* 26.7* 26.2*  MCV 82.2 79.0 82.2 83.4 83.7  PLT 231 386 344 342 316    Cardiac Enzymes: No results found for this  basename: CKTOTAL, CKMB, CKMBINDEX, TROPONINI,  in the last 168 hours  BNP: No components found with this basename: POCBNP,   CBG:  Recent Labs Lab 04/21/13 0606 04/21/13 1101 04/21/13 1602 04/21/13 2110 04/22/13 0559  GLUCAP 123* 158* 219* 140* 145*    Microbiology: Results for orders placed during the hospital encounter of 04/05/13  URINE CULTURE     Status: None   Collection Time    04/07/13  5:53 AM      Result Value Range Status   Specimen Description URINE, CLEAN CATCH   Final   Special Requests NONE   Final   Culture  Setup Time     Final   Value: 04/07/2013 15:27     Performed at [facility]     Final   Value: >=100,000 COLONIES/ML     Performed at [facility]   Culture     Final   Value: Multiple bacterial morphotypes present, none predominant. Suggest appropriate recollection if clinically indicated.     Performed at [facility]   Report  Status 04/08/2013 FINAL   Final  SURGICAL PCR SCREEN     Status: None   Collection Time    04/07/13  4:45 PM      Result Value Range Status   MRSA, PCR NEGATIVE  NEGATIVE Final   Staphylococcus aureus NEGATIVE  NEGATIVE Final   Comment:            The Xpert SA Assay (FDA     approved for NASAL specimens     in patients over 28 years of age),     is one component of     a comprehensive surveillance     program.  Test performance has     been validated by [employer] for patients greater     than or equal to 49 year old.     It is not intended     to diagnose infection nor to     guide or monitor treatment.  URINE CULTURE     Status: None   Collection Time    04/14/13  9:57 PM      Result Value Range Status   Specimen Description URINE, CATHETERIZED   Final   Special Requests Immunocompromised   Final   Culture  Setup Time     Final   Value: 04/15/2013 03:26     Performed at [facility]     Final   Value: NO GROWTH     Performed at [facility]   Culture     Final   Value: NO GROWTH     Performed at [facility]   Report Status 04/16/2013 FINAL   Final  CULTURE, BLOOD (SINGLE)     Status: None   Collection Time    04/14/13 10:38 PM      Result Value Range Status   Specimen Description BLOOD RIGHT HAND   Final   Special Requests BOTTLES DRAWN AEROBIC AND ANAEROBIC 2CC   Final   Culture  Setup Time     Final   Value: 04/15/2013 02:58     Performed at [facility]   Culture     Final   Value: NO GROWTH 5 DAYS     Performed at [facility]   Report Status 04/21/2013 FINAL   Final  CULTURE, RESPIRATORY (NON-EXPECTORATED)     Status: None   Collection Time    04/15/13  1:30 PM      Result Value Range Status   Specimen Description TRACHEAL ASPIRATE   Final   Special Requests Immunocompromised   Final   Gram Stain     Final   Value: RARE WBC PRESENT, PREDOMINANTLY PMN     NO SQUAMOUS EPITHELIAL CELLS SEEN     NO ORGANISMS SEEN     Performed at [facility]   Culture     Final   Value: NO GROWTH 2 DAYS     Performed at [facility]   Report Status 04/18/2013 FINAL   Final  CLOSTRIDIUM DIFFICILE BY PCR     Status: None   Collection Time    04/19/13 12:20 PM      Result Value Range Status   C difficile by pcr NEGATIVE  NEGATIVE Final    Coagulation Studies:  Recent Labs  04/20/13 0550 04/21/13 0620 04/22/13 0500  LABPROT 21.8* 20.0* 19.5*  INR 1.97* 1.76* 1.70*    Urinalysis: No results found for this basename: COLORURINE,  APPERANCEUR, LABSPEC, PHURINE, GLUCOSEU, HGBUR, BILIRUBINUR, KETONESUR, PROTEINUR, UROBILINOGEN, NITRITE, LEUKOCYTESUR,  in the last 72 hours    Imaging: No results found.   Medications:     . amiodarone  200 mg Oral BID PC  . cinacalcet  60 mg Oral Q48H  . dapsone  25 mg Oral Daily  . docusate sodium  200 mg Oral Daily  . furosemide  80 mg Intravenous Q6H  . glipiZIDE  10 mg Oral BID AC  . insulin aspart  0-20 Units Subcutaneous  TID WC  . insulin aspart  0-5 Units Subcutaneous QHS  . magnesium oxide  400 mg Oral BID  . magnesium oxide  400 mg Oral Daily  . metolazone  10 mg Oral Daily  . metoprolol tartrate  25 mg Oral BID  . mycophenolate  360 mg Oral BID  . pantoprazole  40 mg Oral QHS  . potassium chloride  20 mEq Oral BID  . sodium chloride  3 mL Intravenous Q12H  . sodium chloride  3 mL Intravenous Q12H  . tacrolimus  5 mg Oral BID  . warfarin  4 mg Oral q1800  . 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    1.Acute on chronic kidney disease: patient with Cr appears   -stable maybe new baseline Creatinine  2. Volume  - will continue IV lasix and will another x1 dose of metolazone 10mg  (3rd dose on                  sequential days) 3. 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 Had only been on prograf here. Most recent prograf level was 5.4 in early September.   - now back on oral immunuosuppressants 4. Anemic   - T sats 21%   - Aranesp given 10/4     LOS: 13 [name] W @TODAY @11 :28 AM

## 2013-04-22 NOTE — Discharge Summary (Signed)
[address]       [place] [address]             [phone]       [name] 1945/08/19 67 y.o. 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 She is here for cath prior to further evaluation of her AS. She was evaluated by Dr. [name] in late July for increasing dsypnea on exertion. She began to feel like she was having asthma attacks. 2D echo from 11/2012 was consistent with severe AS. [name] wanted to contemplate this further and underwent dental eval in August. Recently she called [name] office and was ready to set up cath/valve surgery. She took her last dose of Coumadin on 03/31/13 and was placed on Lovenox bridge. She presented for planned admission for hydration prior to surgery & neprology eval. She may well need aortic valve replacement, maze procedure +/- CABG pending findings on catheterization. She could also be considered for TAVR but Dr. [name] felt she was most likely a surgical candidate. She denies any CP, LEE, orthopnea, or syncope. She currently has no complaints.  Past Medical History   Diagnosis  Date   .  Coronary artery disease  05/2002     a. Ant MI 2003 s/p PTCA/stent to RCA.   .  Paroxysmal a-fib    .  Myocardial infarction      hx of   .  Hypertension    .  Hyperlipidemia    .  CHF (congestive heart failure)    .  ESRD (end stage renal disease)      a. Mass on L kidney per pt s/p nephrectomy - pt states not cancer - [facility] notes indicate ESRD due to HTN/DM - was previously on HD. b. Kidney transplant 02/2011.   Marland Kitchen  Pericardial effusion      a.  Small by echo 11/2011.   Marland Kitchen  Cerebrovascular accident      a. LMCA infarct felt embolic 123XX123, maintained on chronic coumadin.   Marland Kitchen  GERD (gastroesophageal reflux disease)    .  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  Unspecified hearing loss    .  Diabetes mellitus      type 2   .  Anemia, iron deficiency      of chronic disease   .  Helicobacter pylori (H. pylori) infection      hx of   .  Esophagitis, reflux    .  Gout    .  Sleep apnea      Pt says testing was positive, intolerant of CPAP.   Marland Kitchen  Cholelithiasis    .  Hx of colonic polyps    .  Diverticulosis of colon    .  Streptococcal infection group D enterococcus      Recurrent Enterococcus bacteremia status post removal of infected graft on May 07, 2008, with removal of PermCath and subsequent replacement 06/2008.   Marland Kitchen  Chronic cough  onset 03/2010     Dr  Wert   .  Carotid artery disease      a. Carotid Dopplers performed in August 2013 showed 40-59% left stenosis and 0-39% right; f/u recommended in 2 years.   .  Aortic stenosis      a. Severe AS by echo 11/2012.   .  Asystole      a. During ENT surgery 2005: developed marked asystole requiring CPR, felt due to vagal reaction (cath nonobst dz).   .  Seizures      a. 2009 when she had stroke.    Most Recent Cardiac Studies:  2D Echo 11/2012  - Left ventricle: [name] cavity size was mildly dilated. Wall thickness was increased in a pattern of moderate LVH. Systolic function was normal. [name] estimated ejection fraction was in [name] range of 55% to 60%. - Aortic valve: There was severe stenosis. - Mitral valve: Calcified annulus. - Left atrium: [name] atrium was moderately dilated. - Right atrium: [name] atrium was moderately dilated. - Atrial septum: No defect or patent foramen ovale was identified. - Pulmonary arteries: PA peak pressure: 44mm Hg (S).  Cath 11/2003  CARDIAC CATHETERIZATION  PROCEDURE: Cardiac catheterization.  CLINICAL HISTORY: [name] is 67  years old and has had a previous VMI  treated with angioplasty by Dr. Lyndel Safe. She was having elective ENT surgery  and following surgery she developed bradycardia and marked asystole  requiring external cardiac compression. She was admitted to [name] hospital  and stabilized and seen in consultation with Dr. Lyndel Safe who felt she should  be catheterized to rule out ischemic etiology. When she came to [name]  catheterization lab she was in atrial fibrillation with a rate of about 90  and she converted, while in [name] catheterization lab, back to sinus rhythm.  CARDIOLOGIST: Eustace Quail, M.D.  DESCRIPTION OF PROCEDURE: [name] procedure was performed via [name] right femoral  artery utilizing an arterial sheath and 6-French preformed coronary  catheters. A front wall puncture was performed, and Omnipaque contrast was  used. [name] right femoral artery was closed with AngioSeal at [name] end of [name]  procedure. [name] tolerated [name] procedure well, and left [name]  laboratory in satisfactory condition.  RESULTS:  1. [name] aortic pressure was 187/101 with a mean of 135. [name] left midpressure  was 187/29.  2. [name] left main coronary artery was free of significant disease.  3. [name] left anterior descending artery gave rise to 3 large diagonal  branches and 2 large septal perforators. These and [name] LAD proper were  free of significant disease.  4. [name] circumflex artery gave rise to a ramus branch and 3 posterolateral  branches. These vessels were free of significant disease.  5. [name] right coronary artery is a moderate-size vessel that gave rise a  __________ branch, 3 right ventricular branches, a posterior descending  and 2 posterolateral branches. There was 40% narrowing in [name] midright  coronary artery.  LEFT VENTRICULOGRAM: [name] left ventriculogram performed in [name] RAO  projection showed hypokinesis of [name] inferobasal segment. [name] overall wall  motion was good with an estimated ejection fraction was 60%.  CONCLUSIONS:  Coronary artery disease status post remote documented  myocardial infarction treated with PTCA of [name] right coronary artery with  nonobstructive coronary disease with 40% narrowing in [name] midright coronary  artery and no significant obstruction in [name] circumflex and left anterior  descending arteries and inferobasal wall hypokinesis.  RECOMMENDATIONS: [name]'s asystole following surgery is probably  vagally mediated possibly related to anesthesia of [name] surgery  itself. She  may have sick sinus syndrome since she did have paroxysmal atrial  fibrillation  in [name] hospital, although her rate was controlled. She is at low risk for  systemic emboli from her atrial fibrillation since her LV function is good  and she is 67 years old. I think that we can continue her on her same  medications and we will adjust her medications for her blood pressure and  let her go home later today.  Cath 09/2001  Cardiac Catheterization  PROCEDURES PERFORMED:  1. Left heart catheterization.  2. Left ventriculogram.  3. Selective coronary angiography.  4. Percutaneous transluminal coronary angioplasty and stent placement, mid  right coronary artery.  DIAGNOSES:  1. Severe single-vessel coronary artery disease.  2. Normal left ventricular systolic function.  3. Acute inferior wall myocardial infarction.  HISTORY: [name] is a 67 year old black female who presents to [facility] with acute onset substernal chest discomfort. [name] awoke at  approximately 10 a.m. and had substernal chest discomfort with shortness of  breath and nausea. She felt worse in [name] morning and finally presented to  [facility] Room at approximately 12 noon where she was found to have ST  elevation in [name] inferior leads significant with an acute inferior wall  myocardial infarction. She was brought to [name] catheterization lab emergently  for left heart catheterization.  DESCRIPTION OF PROCEDURE: Informed consent was  obtained. [name] was  brought to [name] catheterization lab. A #7 [place] arterial and #6 French venous  sheath were placed in [name] right groin using [name] modified Seldinger technique.  A #5 French pigtail catheter was then advanced into [name] left ventricle.  Appropriate hemodynamics were obtained. [name] left ventriculogram was then  performed using power injections of contrast. A #6 [place] JL-4 catheter was  then used to engage [name] left coronary artery and selective angiography  performed using manual injections of contrast. Finally, a #7 [place] JR-4  guide catheter was introduced and used to engage [name] right coronary artery.  Selective angiography was performed using manual injections of contrast.  Initial findings are as follows:  FINDINGS:  1. Left main trunk: Mild irregularities.  2. LAD: This is a large caliber vessel that provides 3 diagonal branches.  There is moderate disease of 30-40% in [name] mid section of [name] LAD.  3. Left circumflex artery: This is a large caliber vessel that provides a  small first marginal branch in [name] proximal segment, and a large  bifurcating second marginal branch distally. There is mild disease of  30% at [name] bifurcation of [name] second marginal branch.  4. Right coronary artery: Dominant. This is a small caliber vessel that  provides several small branches of [name] distal segment that ______ [name]  proximal mid inferior septum. [name] right coronary artery has subtotal  narrowing of 99% in [name] mid section followed by narrowing of 70%. This  mid right coronary artery was severely and diffusely diseased. There  were several RV marginal branches that arise from this segment. [name]  distal vessel had mild irregularities.  LV: Normal end systolic and end diastolic dimensions. Overall left  ventricular function appeared well preserved. Ejection fraction of greater  than 65%. No mitral regurgitation. No wall motion abnormalities were noted.  LV pressure was 144/10. Aortic  was 144/85. LVEDP equals 20.  With these findings, we elected to proceed with percutaneous intervention to  [name] right coronary artery. [name] previously had been consented for  participation in [name] [facility]  study and was randomized to placebo. She  received aspirin and heparin in [name] emergency room. ACT was maintained at  greater than 214 seconds. She was given 300 mg of Plavix orally and ReoPro  bolus and infusion on a weight-adjusted basis. A 0.014-inch Extra Sport wire  was advanced in [name] distal right coronary artery and a 2.5- x 15-mm CrossSail  balloon introduced. One inflation was performed at [name] site of subtotal  occlusion at 6 atmospheres for 30 seconds. A total of 5 additional inflations  were performed within [name] mid section of [name] right coronary artery at up to 8  atmospheres for 30 seconds. Repeat angiography showed significant improvement  in vessel lumen with approximately 50% residual narrowing. There was  improvement of TIMI grade 1 to TIMI grade 3 flow. There was no evidence of  vessel damage or distal thromboembolic phenomena; however, moderate vessel  recoil was noted. A 2.75- x 24-mm Express stent was introduced and carefully  positioned between [name] RV marginal branches and deployed at 12 atmospheres for  30 seconds. Mild residual waist was noted of [name] proximal section of [name]  stent of 20-30% and a 2.75- x 12-mm Quantum Maverick balloon was introduced.  A total of 3 inflations were performed within [name] proximal and mid section of  [name] stent at up to 16 atmospheres for 30 seconds. Repeat angiography was then  performed after [name] administration of intracoronary nitroglycerin showing an  excellent result with no residual stenosis, TIMI-3 flow in [name] distal vessel,  and no distal vessel damage. [name] guide catheter was then removed. [name] sheath  was secured in position. [name] tolerated [name] procedure well and was  transferred to [name] floor in stable condition.  FINAL  RESULT: Successful percutaneous transluminal coronary angioplasty and  stent placement of [name] mid right coronary artery with reduction of 99%  subtotal narrowing to 0% with placement of a 2.75- x 24-mm Express 2 stent and  improvement in TIMI grade 1 to TIMI grade 3 flow.  ASSESSMENT AND PLAN:  Dictated by: Christy Sartorius, M.D. [place]  Attending Physician: Alvie Heidelberg  DD: 09/23/01  TD: 09/23/01   Surgical History:  Past Surgical History   Procedure  Laterality  Date   .  Pci of rt coronary artery     .  Cholecystectomy   2009   .  Left av graft       removal of   .  Right av fistula     .  Nasal septal       surgery   .  Tonsillectomy     .  Abdominal hysterectomy     .  Oophorectomy     .  Tubal ligation     .  Nephrectomy   2010     no CA on bx   .  Kidney transplant   03/16/11   .  Cardioversion   05/29/2012     Procedure: CARDIOVERSION; Surgeon: Lelon Perla, MD; Location[facility] ENDOSCOPY; Service: Cardiovascular; Laterality: N/A;    Home Meds:  Prior to Admission medications   Medication  Sig  Start Date  End Date  Taking?  Authorizing Provider   cinacalcet (SENSIPAR) 60 MG tablet  Take 60 mg by mouth every other day.    Yes  Historical Provider, MD   dapsone 25 MG tablet  Take 25 mg by mouth daily.    Yes  Historical Provider, MD   diphenhydrAMINE (BENADRYL) 50 MG capsule  Take  1 capsule (50 mg total) by mouth every 6 (six) hours as needed for itching.  08/31/11   Yes  [name] Plotnikov, MD   enoxaparin (LOVENOX) 80 MG/0.8ML injection  Inject 0.8 mLs (80 mg total) into [name] skin every 12 (twelve) hours.  03/30/13   Yes  Lelon Perla, MD   Flurandrenolide 4 MCG/SQCM TAPE  Apply topically daily.    Yes  Historical Provider, MD   furosemide (LASIX) 20 MG tablet  Take 1 tablet by mouth daily.  09/18/12   Yes  Historical Provider, MD   glipiZIDE (GLUCOTROL XL) 10 MG 24 hr tablet  Take 10 mg by mouth 2 (two) times daily.    Yes  Historical Provider, MD   Magnesium 400  MG CAPS  Take 800 mg by mouth daily.    Yes  Historical Provider, MD   metoprolol tartrate (LOPRESSOR) 25 MG tablet  Take 25 mg by mouth 2 (two) times daily.  05/25/12   Yes  Lelon Perla, MD   mycophenolate (MYFORTIC) 180 MG EC tablet  Take 360 mg by mouth 2 (two) times daily.    Yes  Historical Provider, MD   nystatin (MYCOSTATIN) 100000 UNIT/ML suspension  Take 500,000 Units by mouth daily. Swish and Spit 5 ml as directed    Yes  Historical Provider, MD   pravastatin (PRAVACHOL) 40 MG tablet  Take 40 mg by mouth daily. Take one tablet Mon, Wed, and Friday    Yes  Historical Provider, MD   tacrolimus (PROGRAF) 1 MG capsule  Take 5 mg by mouth 2 (two) times daily. Take 5 capsules in [name] am, and 5 capsules in [name] pm.    Yes  Historical Provider, MD   triamcinolone cream (KENALOG) 0.1 %  Apply 1 application topically 2 (two) times daily.    Yes  Historical Provider, MD   warfarin (COUMADIN) 5 MG tablet  Take 5-7.5 mg by mouth daily. Take 5 mg on Monday, Wednesday, Thursday, Friday, Saturday, and Sunday. Take 7.5 mg on Tuesday.    Yes  Historical Provider, MD   Allergies:  Allergies   Allergen  Reactions   .  Ibuprofen  Nausea And Vomiting   .  Sulfamethoxazole W-Trimethoprim  Swelling   .  Sulfonamide Derivatives  Swelling   .  Tape  Rash   .  Tramadol  Nausea And Vomiting   .  Bactrim  Swelling   .  Red Dye     History    Social History   .  Marital Status:  Married     Spouse Name:  N/A     Number of Children:  N/A   .  Years of Education:  N/A    Occupational History   .  retired     Social History Main Topics   .  Smoking status:  Former Smoker -- 1.00 packs/day for 30 years     Types:  Cigarettes     Quit date:  07/19/2001   .  Smokeless tobacco:  Not on file   .  Alcohol Use:  No   .  Drug Use:  No   .  Sexual Activity:  Not Currently    Other Topics  Concern   .  Not on file    Social History Narrative    [name] signed a Designated Party Release to allow her  spouse [name] and family and five children to have access to her medical records/information.  Family History   Problem  Relation  Age of Onset   .  Stroke  Father    .  Diabetes  Other    .  Hypertension  Mother         Hospital Course:  [name] was admitted for pre-contrast hydration do to her history of chronic renal failure and renal transplant with stable allograft.. she was seen in consultation by [name] nephrologist who assisted with management. She underwent cardiac catheterization on 04/06/2013 which revealed [name] following: Cardiac Catheterization Procedure Note  Name: [name]  MRN: [id number]  DOB: 1945-11-28  Procedure: Right Heart Cath, Left Heart Cath, Selective Coronary Angiography  Indication: severe aortic stenosis  Procedural Details: [name] right groin was prepped, draped, and anesthetized with 1% lidocaine. Using [name] modified Seldinger technique a 5 French sheath was placed in [name] right femoral artery and a 7 French sheath was placed in [name] right femoral vein. A Swan-Ganz catheter was used for [name] right heart catheterization. Standard protocol was followed for recording of right heart pressures and sampling of oxygen saturations. Fick cardiac output was calculated. Standard Judkins catheters were used for selective coronary angiography. There were no immediate procedural complications. [name] was transferred to [name] post catheterization recovery area for further monitoring.  Procedural Findings:  Hemodynamics  RA 9  RV 49/8  PA 46/22 mean 35  PCWP v wave 41 mean 29  LV not recorded  AO 178/94  Oxygen saturations:  PA 62  AO 93  Cardiac Output (Fick) 4.54  Cardiac Index (Fick) 2.47  Coronary angiography:  Coronary dominance: right  Left mainstem: Mildly calcified, widely patent. A rise from [name] left cusp it divides into [name] LAD and left circumflex.  Left anterior descending (LAD): Large-caliber vessel that reaches and wraps around [name] left ventricular  apex. [name] LAD is moderately calcified. At [name] origin of [name] first diagonal there is 30-40% stenosis. There is no high-grade disease throughout [name] course of [name] LAD distribution. [name] diagonal branches are widely patent.  Left circumflex (LCx): [name] left circumflex is of large caliber. [name] vessel is widely patent throughout. He obtuse marginal branches are patent without significant stenoses.  Right coronary artery (RCA): [name] RCA is dominant. [name] vessel is heavily calcified. He RCA is totally occluded in [name] midportion within [name] previously stented segment. There is faint filling of [name] distal RCA via left to right collaterals. [name] distal branch vessels of [name] RCA appear very small with severe diffuse disease.  Left ventriculography: Deferred because of chronic kidney disease  Final Conclusions:  1. Patent left main, LAD, and left circumflex, with mild nonobstructive disease of these vessels  2. Total occlusion of [name] RCA with left to right collaterals filling small terminal branches of [name] RCA  3. Heavily calcified aortic valve under fluoroscopic evaluation in this [name] with known severe aortic stenosis  4. Pulmonary hypertension, suspect secondary to left heart disease (transpulmonic gradient less than 10 mm mercury)  Recommendations:  Cardiac surgery evaluation  Sherren Mocha  04/06/2013, 9:45 AM Surgical consultation was obtained with Darylene Price M.D. who evaluated [name] and studies and agree with recommendations to proceed with surgery. She remained medically stable. A CT scan of [name] chest demonstrated severely calcified transverse aortic arch and descending thoracic aorta, but [name]  ascending aorta was much less affected. [name] was felt to be stable to proceed with surgery and on 04/11/2013 was taken to [name] operating room at which time she underwent [name] following procedure:  CARDIOTHORACIC SURGERY OPERATIVE NOTE  Date of Procedure: 04/11/2013  Preoperative Diagnosis:  Severe Aortic  Stenosis  Recurrent Persistent Atrial Fibrillation  Inadvertent Placement of Introducing Sheath into Right Carotid Artery Postoperative Diagnosis: Same  Procedure:  Placement of Left Subclavian Introducing Sheath Using Fluoroscopic Guidance  Placement of Gordy [place] Pulmonary Artery Catheter Using Fluoroscopic Guidance Surgeon: Valentina Gu. Roxy Manns, MD  Anesthesia: Warrick Parisian, MD Due to this complication vascular surgical consultation was obtained with Harold Barban M.D. who had a weighted [name] and felt urgent surgical exploration was warranted. [name] following procedure was then performed:  04/05/2013 - 04/11/2013  Pre-operative Diagnosis: Catheter was then right carotid artery  Post-operative diagnosis: Same  Surgeon: Eldridge Abrahams  Assistants: Dr. Roxy Manns  Procedure: #1: Exploration of right carotid artery.  #2: Removal of catheter within [name] right carotid and primary repair of right carotid  Anesthesia: Gen.  Blood Loss: See anesthesia record  Specimens: None  Findings: [name] catheter entry point was just distal to [name] innominate artery bifurcation. This was repaired with pledgeted 5-0 Prolene suture  It was determined during this procedure that [name] should postpone [name] cardiac surgical procedure until a later date. She remained stable overnight and [name] procedure was scheduled for [name] next day. [name] following procedure was performed:   CARDIOTHORACIC SURGERY OPERATIVE NOTE  Date of Procedure: 04/12/2013  Preoperative Diagnosis:  Severe Aortic Stenosis  Recurrent Persistent Atrial Fibrillation Postoperative Diagnosis: Same  Procedure:  Aortic Valve Replacement Edwards Magna Ease Pericardial Tissue Valve (size 23 mm, model # 3300TFX, serial # [id number])  Maze Procedure Complete biatrial lesion set using cryothermy and bipolar radiofrequency ablation  Obliteration of Left Atrial Appendage (Atriclip, size 75mm)  Surgeon: Valentina Gu. Roxy Manns, MD  Assistant: John Giovanni,  PA-C  Anesthesia: Delma Freeze, MD  Operative Findings:  Severe calcific aortic stenosis  Normal LV systolic function  Severe LV hypertrophy with diastolic dysfunction  Mild mitral regurgitation  Mild tricuspid regurgitation   Postoperative hospital course: [name] has had a slow steady recovery. She did have a significant volume overload and was managed in conjunction with nephrology who also assisted in management of her immunosuppressants.. She was able to be weaned from [name] ventilator without significant difficulty. Dopamine was weaned early postoperatively and creatinine did elevate from her preoperative level, however stabilized with time. She developed a presumed postoperative pneumonia which was treated with intravenous vancomycin and Zosyn. This showed improvement both clinically as well as chest x-ray appearance. Was sugars were controlled using standard protocols with transition to oral home regimen. She did have postoperative atrial fibrillation which subsequently chemically cardioverted to sinus rhythm. She had some postoperative dysphagia which improved over time and was followed by speech therapy. She gradually transitioned to a regular diet. She had some postoperative diarrhea and C. difficile was negative. This improved over time. She was also placed on Coumadin for anticoagulation therapy. INR was monitored daily and dosing was adjusted. She showed good and gradual slow improvement in regards to her rehabilitation. She had 1 episode of gross hematuria which resolved spontaneously. It was felt that this could be monitored as outpatient by nephrology. At time of discharge she was felt to be quite stable.     Recent Labs  04/20/13 0550 04/22/13 0500  NA 140 135  K 3.6 3.5  CL 104 93*  CO2 23 26  GLUCOSE 125* 161*  BUN 48* 54*  CALCIUM 8.4 8.5    Recent Labs  04/20/13 0550 04/22/13  0500  WBC 15.8* 13.9*  HGB 8.6* 9.6*  HCT 26.7* 26.2*  PLT 342 316     Recent Labs  04/21/13 0620 04/22/13 0500  INR 1.76* 1.70*     Discharge Instructions:  [name] is discharged to home with extensive instructions on wound care and progressive ambulation.  They are instructed not to drive or perform any heavy lifting until returning to see [name] physician in his office.  Discharge Diagnosis:  Chest pain AS aortic stenosis; a fib Acute on chronic renal insufficiency. Secondary Diagnosis: [name] Active Problem List   Diagnosis Date Noted  . S/P aortic valve replacement with bioprosthetic valve and maze procedure 04/12/2013  . S/P Maze operation for atrial fibrillation 04/12/2013  . Nodule of right lung 04/07/2013  . Pain in limb 05/22/2012  . Cramps, muscle, general 02/29/2012  . Dermatitis 01/26/2012  . Knee pain 01/26/2012  . Wrist pain 01/26/2012  . Encounter for long-term (current) use of anticoagulants 08/17/2011  . S/P kidney transplant 03/16/2011  . Knee pain, right 02/23/2011  . LBP (low back pain) 02/23/2011  . Hypersalivation 11/24/2010  . AORTIC STENOSIS 08/20/2010  . DISTURBANCE OF SALIVARY SECRETION 07/28/2010  . COAGULOPATHY 05/27/2010  . NAUSEA 04/14/2010  . DYSPHAGIA, PHARYNGOESOPHAGEAL PHASE 03/10/2010  . End stage renal disease 12/09/2009  . SMOKER 10/07/2009  . ALOPECIA 10/07/2009  . SKIN RASH 10/07/2009  . CAROTID STENOSIS 03/04/2009  . PRURITUS 03/04/2009  . STREP INF CCE & UNS SITE GROUP D [ENTEROCOCCUS] 12/31/2008  . ANEMIA 12/31/2008  . Chronic Persistent Atrial Fibrillation 12/31/2008  . ECCHYMOSES 10/29/2008  . Cough 08/27/2008  . ABDOMINAL PAIN 08/27/2008  . NEOPLASM, MALIGNANT, KIDNEY 07/02/2008  . APHASIA DUE TO CEREBROVASCULAR DISEASE 07/02/2008  . FATIGUE 05/21/2008  . HYPERLIPIDEMIA 09/27/2007  . PERICARDIAL EFFUSION 09/27/2007  . CEREBROVASCULAR ACCIDENT 09/27/2007  . CONSTIPATION 09/27/2007  . SLEEP APNEA 09/27/2007  . CHOLELITHIASIS 06/09/2007  . RENAL FAILURE 06/09/2007  .  ESOPHAGITIS, REFLUX 06/08/2007  . DUODENITIS, WITHOUT HEMORRHAGE 06/08/2007  . HELICOBACTER PYLORI INFECTION, HX OF 06/08/2007  . DIABETES MELLITUS, TYPE II 05/23/2007  . GOUT 05/23/2007  . HYPERTENSION 05/23/2007  . MYOCARDIAL INFARCTION, HX OF 05/23/2007  . CORONARY ARTERY DISEASE 05/23/2007  . CONGESTIVE HEART FAILURE 05/23/2007  . GERD 05/23/2007  . COLONIC POLYPS, HX OF 05/23/2007  . DIVERTICULOSIS, COLON 07/01/2005   Past Medical History  Diagnosis Date  . Coronary artery disease 05/2002    a. Ant MI 2003 s/p PTCA/stent to RCA.   Marland Kitchen Hypertension   . Hyperlipidemia   . CHF (congestive heart failure)   . Pericardial effusion     a. Small by echo 11/2011.  Marland Kitchen GERD (gastroesophageal reflux disease)   . 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 Unspecified hearing loss   . Anemia, iron deficiency     of chronic disease  . Helicobacter pylori (H. pylori) infection     hx of  . Esophagitis, reflux   . Gout   . Cholelithiasis   . Hx of colonic polyps   . Diverticulosis of colon   . 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  Marland Kitchen Chronic cough onset 03/2010    Dr Melvyn Novas  . Carotid artery disease     a. Carotid Dopplers performed in August 2013 showed 40-59% left stenosis and 0-39% right; f/u recommended in 2  years.   . Aortic stenosis     a. Severe AS by echo 11/2012.  . Asystole     a. During ENT surgery 2005: developed marked asystole requiring CPR, felt due to vagal reaction (cath nonobst dz).  . Myocardial infarction 2003  . Cerebrovascular accident 2009    a. LMCA infarct felt embolic 123XX123, maintained on chronic coumadin.; denies residual on 04/05/2013  . Sleep apnea     Pt says testing was positive, intolerant of CPAP.  Marland Kitchen Type II diabetes mellitus   . ESRD (end stage renal disease)     a. Mass on L  kidney per pt s/p nephrectomy - pt states not cancer - [facility] notes indicate ESRD due to HTN/DM - was previously on HD. b. Kidney transplant 02/2011.  . S/P kidney transplant 03/16/2011  . Chronic Persistent Atrial Fibrillation 12/31/2008    Qualifier: Diagnosis of  By: [name]    . Nodule of right lung 04/07/2013    Ground glass opacity right lung  . S/P aortic valve replacement with bioprosthetic valve and maze procedure 04/12/2013    8mm [facility] Ease bovine pericardial tissue valve   . S/P Maze operation for atrial fibrillation 04/12/2013    Complete bilateral atrial lesion set using bipolar radiofrequency and cryothermy ablation with clipping of LA appendage  . Pruritic dermatitis     treated with steriods/UV light   Discharge medications:   Medication List    STOP taking these medications       enoxaparin 80 MG/0.8ML injection  Commonly known as:  LOVENOX     Magnesium 400 MG Caps     nystatin 100000 UNIT/ML suspension  Commonly known as:  MYCOSTATIN      TAKE these medications       amiodarone 200 MG tablet  Commonly known as:  PACERONE  Take 1 tablet (200 mg total) by mouth 2 (two) times daily after a meal.     cinacalcet 60 MG tablet  Commonly known as:  SENSIPAR  Take 60 mg by mouth every other day.     dapsone 25 MG tablet  Take 25 mg by mouth daily.     diphenhydrAMINE 50 MG capsule  Commonly known as:  BENADRYL  Take 1 capsule (50 mg total) by mouth every 6 (six) hours as needed for itching.     Flurandrenolide 4 MCG/SQCM Tape  Apply topically daily.     furosemide 80 MG tablet  Commonly known as:  LASIX  Take 1 tablet (80 mg total) by mouth 2 (two) times daily.     glipiZIDE 10 MG 24 hr tablet  Commonly known as:  GLUCOTROL XL  Take 10 mg by mouth 2 (two) times daily.     metoprolol tartrate 25 MG tablet  Commonly known as:  LOPRESSOR  Take 25 mg by mouth 2 (two) times daily.     mycophenolate 180 MG EC tablet  Commonly known as:   MYFORTIC  Take 360 mg by mouth 2 (two) times daily.     oxyCODONE 5 MG immediate release tablet  Commonly known as:  Oxy IR/ROXICODONE  Take 1-2 tablets (5-10 mg total) by mouth every 4 (four) hours as needed for pain (pain).     pravastatin 40 MG tablet  Commonly known as:  PRAVACHOL  Take 40 mg by mouth daily. Take one tablet Mon, Wed, and Friday     tacrolimus 1 MG capsule  Commonly known as:  PROGRAF  Take 5 mg by  mouth 2 (two) times daily. Take 5 capsules in [name] am, and 5 capsules in [name] pm.     triamcinolone cream 0.1 %  Commonly known as:  KENALOG  Apply 1 application topically 2 (two) times daily.     warfarin 4 MG tablet  Commonly known as:  COUMADIN  Take 0.5-1 tablets (2-4 mg total) by mouth daily. Take Coumadin 4 mg po every Monday Wednesday, Friday. Take Coumadin 2 mg every Tuesday, Thursday, Saturday, and Sunday.       Follow-up Information   Follow up with Rexene Alberts, MD. (3 weeks-[name] office will contact you about an appointment. Please obtain a chest x-ray [facility] imaging 1 hour prior to [name] appointment. [place] is located in [name] same office complex.)    Specialty:  Cardiothoracic Surgery   Contact information:   [address] [address] [address] [phone]       Follow up with [name], MD. (2 weeks-please contact [name] office to arrange this appointment.)    Specialty:  Cardiology   Contact information:   [id number] [place] [address] [phone]      Disposition: Discharged home  [name]'s condition is Kathyrn Drown, [place] 04/22/2013  11:28 AM

## 2013-04-23 LAB — GLUCOSE, CAPILLARY
Glucose-Capillary: 183 mg/dL — ABNORMAL HIGH (ref 70–99)
Glucose-Capillary: 190 mg/dL — ABNORMAL HIGH (ref 70–99)

## 2013-04-23 LAB — PROTIME-INR: INR: 1.79 — ABNORMAL HIGH (ref 0.00–1.49)

## 2013-04-23 MED ORDER — OXYCODONE HCL 5 MG PO TABS: 5.0000 mg | ORAL_TABLET | ORAL | Status: DC | PRN

## 2013-04-23 MED ORDER — FUROSEMIDE 80 MG PO TABS: 80.0000 mg | ORAL_TABLET | Freq: Two times a day (BID) | ORAL | Status: DC

## 2013-04-23 MED ORDER — WARFARIN SODIUM 4 MG PO TABS: 2.0000 mg | ORAL_TABLET | Freq: Every day | ORAL | Status: DC

## 2013-04-23 MED ORDER — AMIODARONE HCL 200 MG PO TABS: 200.0000 mg | ORAL_TABLET | Freq: Two times a day (BID) | ORAL | Status: DC

## 2013-04-23 MED ORDER — CALCIUM CARBONATE ANTACID 500 MG PO CHEW
1.0000 | CHEWABLE_TABLET | Freq: Three times a day (TID) | ORAL | Status: DC | PRN
  Administered 2013-04-23: 200 mg via ORAL
  Filled 2013-04-23: qty 1

## 2013-04-23 NOTE — Progress Notes (Signed)
CARDIAC REHAB PHASE I   Pt walked with student 150 ft. Apparently does not want to go any further. Ed completed. Voiced good understanding and requests her name be sent to [facility]. Set up d/c video for pt.  [phone]  Darrick Meigs CES, ACSM 04/23/2013 10:21 AM

## 2013-04-23 NOTE — Progress Notes (Signed)
PT REFUSED TO AMBULATE WITH CARDIAC REHAB AND [facility]. EXPLAINED TO PT IMPORTANCE OF AMBULATING AFTER SURGERY. PT VU.

## 2013-04-23 NOTE — Progress Notes (Signed)
Occupational Therapy Treatment Patient Details Name: CHALON YAZDANI MRN: [id number] DOB: August 30, 1945 Today's Date: 04/23/2013 Time: [id number] OT Time Calculation (min): 24 min  OT Assessment / Plan / Recommendation  History of present illness Ms. Geng is a 67 y/o F with history of CAD s/p MI/RCA stent 2005, aortic stenosis, embolic CVA 123XX123, PAF on Coumadin, DM, pericardial effusion, and CKD (ESRD due to HTN/DM -> HD then renal transplant 2012 followed at [facility]) who presented to [facility] today for planned direct admission for pre-cath hydration. Pt with severe aortic stenosis now s/p AVR on 9/24.     OT comments  Pt making progress with functional goals. Pt dos require cues for safety to maintain sternal precautions. Pt has declined CIR consult and would like to return home with [facility]  Follow Up Recommendations  Home health OT;Supervision/Assistance - 24 hour    Barriers to Discharge   None    Equipment Recommendations  3 in 1 bedside comode    Recommendations for Other Services    Frequency Min 2X/week   Progress towards OT Goals Progress towards OT goals: Progressing toward goals  Plan Discharge plan remains appropriate    Precautions / Restrictions Precautions Precautions: Sternal;Fall Precaution Comments: Reviewed sternal precautions Restrictions Weight Bearing Restrictions: No   Pertinent Vitals/Pain 2/10    ADL  Grooming: Performed;Wash/dry hands;Wash/dry face;Supervision/safety Where Assessed - Grooming: Supported standing Upper Body Bathing: Simulated;Minimal assistance Where Assessed - Upper Body Bathing: Unsupported sitting Lower Body Bathing: Simulated;Moderate assistance Upper Body Dressing: Performed;Minimal assistance Toilet Transfer: Performed;Min guard Toilet Transfer Method: Sit to stand Toilet Transfer Equipment: Regular height toilet;Raised toilet seat with arms (or 3-in-1 over toilet) Toileting - Clothing Manipulation and Hygiene:  Performed;Minimal assistance Where Assessed - Camera operator Manipulation and Hygiene: Standing Equipment Used: Gait belt;Rolling walker;Other (comment) (3 in 1) Transfers/Ambulation Related to ADLs: verbal cues fro safe technique for not pushsing thorugh arms or pulling up with arms, however,pt aware of precautions but  states that she cannot stand without using her arms    OT Diagnosis:    OT Problem List:   OT Treatment Interventions:     OT Goals(current goals can now be found in the care plan section)    Visit Information  Last OT Received On: 04/23/13 History of Present Illness: Ms. Dilullo is a 67 y/o F with history of CAD s/p MI/RCA stent 2005, aortic stenosis, embolic CVA [id number], PAF on Coumadin, DM, pericardial effusion, and CKD (ESRD due to HTN/DM -> HD then renal transplant 2012 followed at [facility]) who presented to [facility] today for planned direct admission for pre-cath hydration. Pt with severe aortic stenosis now s/p AVR on 9/24.      Subjective Data      Prior Functioning       Cognition  Cognition Arousal/Alertness: Awake/alert Behavior During Therapy: WFL for tasks assessed/performed Overall Cognitive Status: Within Functional Limits for tasks assessed    Mobility  Bed Mobility Bed Mobility: Not assessed Details for Bed Mobility Assistance: pt up in recliner Transfers Transfers: Sit to Stand;Stand to Sit Sit to Stand: 4: Min guard;With upper extremity assist;From chair/3-in-1;From toilet Stand to Sit: To chair/3-in-1;To toilet;4: Min guard Details for Transfer Assistance: verbal cues for safe technique/strenal precautions for not pushsing through arms or pulling up with arms, however, pt aware of precautions but  states that she cannot stand without using her arms    Exercises      Balance Balance Balance Assessed: Yes Dynamic Standing Balance Dynamic  Standing - Balance Support: No upper extremity supported;During functional activity Dynamic  Standing - Level of Assistance: 5: Stand by assistance   End of Session OT - End of Session Equipment Utilized During Treatment: Rolling walker;Gait belt;Other (comment) (3 in 1) Activity Tolerance: Patient tolerated treatment well Patient left: in chair;with call bell/phone within reach  GO     Britt Bottom 04/23/2013, 11:33 AM

## 2013-04-23 NOTE — Progress Notes (Signed)
      [address]       [place] [address]             [phone]      11 Days Post-Op Procedure(s) (LRB): AORTIC VALVE REPLACEMENT (AVR) (N/A) MAZE (N/A) INTRAOPERATIVE TRANSESOPHAGEAL ECHOCARDIOGRAM (N/A)  Subjective:  [name] had an episode of gross hematuria yesterday evening.  She states it was just on one occasion and her urine is regular color this morning. + Ambulation + BM  Objective: Vital signs in last 24 hours: Temp:  [97.6 F (36.4 C)-98.9 F (37.2 C)] 98.7 F (37.1 C) (10/06 0529) Pulse Rate:  [63-72] 67 (10/06 0529) Cardiac Rhythm:  [-] Normal sinus rhythm;Bundle branch block (10/05 1945) Resp:  [16-17] 17 (10/06 0529) BP: (108-133)/(61-73) 108/66 mmHg (10/06 0529) SpO2:  [92 %-94 %] 93 % (10/06 0529) Weight:  [182 lb 5.1 oz (82.7 kg)] 182 lb 5.1 oz (82.7 kg) (10/06 0529)  Intake/Output from previous day: 10/05 0701 - 10/06 0700 In: 120 [P.O.:120] Out: 1100 [Urine:1100]  General appearance: alert, cooperative and no distress Heart: regular rate and rhythm Lungs: clear to auscultation bilaterally Abdomen: soft, non-tender; bowel sounds normal; no masses,  no organomegaly Extremities: edema 1+ pitting Wound: clean and dry  Lab Results:  Recent Labs  04/22/13 0500  WBC 13.9*  HGB 9.6*  HCT 26.2*  PLT 316   BMET:  Recent Labs  04/22/13 0500  NA 135  K 3.5  CL 93*  CO2 26  GLUCOSE 161*  BUN 54*  CREATININE 2.51*  CALCIUM 8.5    PT/INR:  Recent Labs  04/23/13 0540  LABPROT 20.3*  INR 1.79*   ABG    Component Value Date/Time   PHART 7.357 04/16/2013 0520   HCO3 22.8 04/16/2013 0520   TCO2 24.1 04/16/2013 0520   ACIDBASEDEF 1.9 04/16/2013 0520   O2SAT 97.1 04/16/2013 0520   CBG (last 3)   Recent Labs  04/22/13 1605 04/22/13 2143 04/23/13 0602  GLUCAP 113* 136* 183*    Assessment/Plan: S/P Procedure(s) (LRB): AORTIC VALVE REPLACEMENT (AVR) (N/A) MAZE (N/A) INTRAOPERATIVE TRANSESOPHAGEAL  ECHOCARDIOGRAM (N/A)  1.  CV- NSR- continue Lopressor, Amiodarone 2. Pulm- off oxygen, continue IS 3. Renal- episode of gross hematuria last night, urine regular color this morning, UA negative- creatinine stable, nephrology following 4. INR 1.79- on Coumadin at 4 mg, gross hematuria last night, maintaining NSR ? Possibly d/c Coumadin 5. Diarrhea- resolved, stools formed 6. Dispo- patient is medically stable, one episode of gross hematuria yesterday afternoon, will await nephrology recs, and discuss discharge with staff   LOS: 18 days    [name] 04/23/2013

## 2013-04-23 NOTE — Progress Notes (Signed)
PT AMBULATED IN HALLWAY APPROX 150FT WITH WALKER, NO DISTRESS NOTED. ENCOURAGED TO CALL WITH NEEDS.

## 2013-04-23 NOTE — Progress Notes (Signed)
Per doctor's order 4 ct sutures removed incision edges approximated. Steri strips applied and site cleaned with betadine. Site was clean, dry, and intact with no signs of infection. Patient tolerated well.

## 2013-04-23 NOTE — Progress Notes (Signed)
Admit: 04/05/2013 LOS: 55  73F s/p KT 2012 on MMF/Tac w/ severe symptomatic AS and AFib s/p AVR and MAZE procedure 9/25.  BL Scr in 1s, has been stable 2.1-2.5 for past several days.    Subjective:   Weight continues to improve towards preoperative baseline.  Had single episode of macroscopic hematuria yesterday, resolved and has been urintating clear yellow urine w/o difficulty since.  No SOB.  Ambulating well.  Eating well.    10/05 0701 - 10/06 0700 In: 120 [P.O.:120] Out: 1100 [Urine:1100]  Filed Weights   04/21/13 0547 04/22/13 0419 04/23/13 0529  Weight: 87.454 kg (192 lb 12.8 oz) 84.596 kg (186 lb 8 oz) 82.7 kg (182 lb 5.1 oz)    Current meds: reviewed including cinacalcet 60mg , amiodarone, lasix 80 IV TID, MTP 25 BID, MMF 360 BID, Tac 5 BID, Warfarin Current Labs: reviewed, INR 1.79.    Physical Exam:  Blood pressure 108/66, pulse 67, temperature 98.7 F (37.1 C), temperature source Oral, resp. rate 17, height 5\' 2"  (1.575 m), weight 82.7 kg (182 lb 5.1 oz), SpO2 93.00%. CV: RRR LUNGS: CTAB GEN: speaks in full sentences, appears comfortable ABD: s/nt/nd  EXT: 1+ LEE  SKIN: surgical site apperas c/d/i  Assessment/Plan 1. S/p KT: have arranged f/u appt with me. No change to immunusuppression.  Cont dapsone.   2. Macroscopic Hematuria: single event on anticoagulation.  Pt is urinating normally now and urine has cleared. Will follow as oupt and if persists further evaluate and consider cystoscopy.  3. Edema: rec change to 80 PO BID furosemide as outpt.   4. 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 Anemia: Hb stable.  Will follow as outpt.  Aranesp 10/4  Pearson Grippe MD 04/23/2013, 10:48 AM   Recent Labs Lab 04/17/13 1400  04/19/13 0700 04/20/13 0550 04/22/13 0500  NA 139  < > 140 140 135  K 3.5  < > 3.3* 3.6 3.5  CL 103  < > 102 104 93*  CO2 24  < > 23 23 26   GLUCOSE 202*  < > 101* 125* 161*  BUN 45*  < > 46* 48* 54*  CREATININE 2.14*  < > 2.44* 2.55* 2.51*   CALCIUM 8.6  < > 8.7 8.4 8.5  PHOS 2.2*  --   --   --  3.7  < > = values in this interval not displayed.  Recent Labs Lab 04/19/13 0700 04/20/13 0550 04/22/13 0500  WBC 15.6* 15.8* 13.9*  HGB 9.5* 8.6* 9.6*  HCT 28.2* 26.7* 26.2*  MCV 82.2 83.4 83.7  PLT 344 342 316    Current Facility-Administered Medications  Medication Dose Route Frequency Provider Last Rate Last Dose  . 0.9 %  sodium chloride infusion  250 mL Intravenous PRN Erin Barrett, PA-C      . amiodarone (PACERONE) tablet 200 mg  200 mg Oral BID PC Rexene Alberts, MD   200 mg at 04/22/13 1759  . cinacalcet (SENSIPAR) tablet 60 mg  60 mg Oral Q48H Rexene Alberts, MD   60 mg at 04/22/13 1032  . dapsone tablet 25 mg  25 mg Oral Daily Rexene Alberts, MD   25 mg at 04/22/13 1031  . diphenhydrAMINE (BENADRYL) capsule 25 mg  25 mg Oral Q6H PRN Rexene Alberts, MD   25 mg at 04/22/13 2209  . docusate sodium (COLACE) capsule 200 mg  200 mg Oral Daily Rexene Alberts, MD   200 mg at 04/17/13 1012  .  fentaNYL (SUBLIMAZE) injection 12.5 mcg  12.5 mcg Intravenous Q2H PRN Ivin Poot, MD   12.5 mcg at 04/22/13 0535  . furosemide (LASIX) injection 80 mg  80 mg Intravenous Q6H Sherril Croon, MD   80 mg at 04/23/13 0645  . glipiZIDE (GLUCOTROL XL) 24 hr tablet 10 mg  10 mg Oral BID AC Wayne E Gold, PA-C   10 mg at 04/23/13 0645  . insulin aspart (novoLOG) injection 0-20 Units  0-20 Units Subcutaneous TID WC Rexene Alberts, MD   4 Units at 04/23/13 [phone]  . insulin aspart (novoLOG) injection 0-5 Units  0-5 Units Subcutaneous QHS Rexene Alberts, MD      . labetalol (NORMODYNE,TRANDATE) injection 10 mg  10 mg Intravenous Q2H PRN Rexene Alberts, MD   10 mg at 04/17/13 0540  . loperamide (IMODIUM) 1 MG/5ML solution 2 mg  2 mg Oral PRN Melrose Nakayama, MD   2 mg at 04/22/13 0603  . metoprolol tartrate (LOPRESSOR) tablet 25 mg  25 mg Oral BID Rexene Alberts, MD   25 mg at 04/22/13 2208  . mycophenolate (MYFORTIC) EC tablet 360 mg   360 mg Oral BID Sherril Croon, MD   360 mg at 04/22/13 2209  . ondansetron (ZOFRAN) injection 4 mg  4 mg Intravenous Q6H PRN Rexene Alberts, MD   4 mg at 04/13/13 1006  . oxyCODONE (Oxy IR/ROXICODONE) immediate release tablet 5-10 mg  5-10 mg Oral Q4H PRN Melrose Nakayama, MD   10 mg at 04/23/13 0857  . pantoprazole (PROTONIX) EC tablet 40 mg  40 mg Oral QHS Eudelia Bunch, RPH   40 mg at 04/22/13 2209  . potassium chloride SA (K-DUR,KLOR-CON) CR tablet 20 mEq  20 mEq Oral BID Rexene Alberts, MD   20 mEq at 04/22/13 2209  . sodium chloride 0.9 % injection 3 mL  3 mL Intravenous Q12H Rexene Alberts, MD      . sodium chloride 0.9 % injection 3 mL  3 mL Intravenous PRN Rexene Alberts, MD   3 mL at 04/15/13 1136  . sodium chloride 0.9 % injection 3 mL  3 mL Intravenous Q12H Erin Barrett, PA-C   3 mL at 04/22/13 2210  . sodium chloride 0.9 % injection 3 mL  3 mL Intravenous PRN Erin Barrett, PA-C      . tacrolimus (PROGRAF) capsule 5 mg  5 mg Oral BID Sherril Croon, MD   5 mg at 04/22/13 2209  . warfarin (COUMADIN) tablet 4 mg  4 mg Oral q1800 Wayne E Gold, PA-C   4 mg at 04/22/13 1759  . Warfarin - Physician Dosing Inpatient   Does not apply [place], [facility]

## 2013-04-23 NOTE — Progress Notes (Signed)
Physical Therapy Treatment Patient Details Name: [name] MRN: [id number] DOB: 08/29/1945 Today's Date: 04/23/2013 Time: 1245-1310 PT Time Calculation (min): 25 min  PT Assessment / Plan / Recommendation  History of Present Illness [name] is a 67 y/o F with history of CAD s/p MI/RCA stent 2005, aortic stenosis, embolic CVA 123XX123, PAF on Coumadin, DM, pericardial effusion, and CKD (ESRD due to HTN/DM -> HD then renal transplant 2012 followed at [facility]) who presented to [facility] today for planned direct admission for pre-cath hydration. Pt with severe aortic stenosis now s/p AVR on 9/24.     PT Comments   Pt is progressing well with goals, home set up discussed and she is planning to stay downstairs in recliner for first week. Advised her to practice stairs with HHPT before attempting. D/C education given to husband during session. Encouraged pt in continuing to ambulate frequently, slowly increasing distance. PT will continue to follow.  Follow Up Recommendations  Home health PT;Supervision/Assistance - 24 hour     Does the patient have the potential to tolerate intense rehabilitation     Barriers to Discharge        Equipment Recommendations  Rolling walker with 5" wheels    Recommendations for Other Services    Frequency Min 3X/week   Progress towards PT Goals Progress towards PT goals: Progressing toward goals  Plan Discharge plan needs to be updated    Precautions / Restrictions Precautions Precautions: Sternal;Fall Precaution Comments: Reviewed sternal precautions, pt verrbalizes understanding but having trouble with them with transfers Restrictions Weight Bearing Restrictions: No   Pertinent Vitals/Pain No c/o pain, HR 70 bpm with amb    Mobility  Bed Mobility Bed Mobility: Not assessed Details for Bed Mobility Assistance: pt up in recliner Transfers Transfers: Sit to Stand;Stand to Sit Sit to Stand: 5: Supervision;From chair/3-in-1;From toilet Stand  to Sit: 5: Supervision;To toilet;To chair/3-in-1 Details for Transfer Assistance: pt able to get up with one hand on chair and one hand forward, cannot get up with both hands forward. Discussed minimizing chest pressure and importance of not pushing with both hands on chair and she understands this Ambulation/Gait Ambulation/Gait Assistance: 5: Supervision Ambulation Distance (Feet): 225 Feet Assistive device: Rolling walker Ambulation/Gait Assistance Details: pt doing well increasing ambulation distance, takes 30 sec standing rest break about each 75 ft. Discussed continuing to ambulate frequently ro increase stamina and she verbalizes understanding though has been somewhat resistant while hospitalized Gait Pattern: Step-through pattern;Decreased stride length;Trunk flexed Gait velocity: decreased Stairs: No Wheelchair Mobility Wheelchair Mobility: No    Exercises General Exercises - Lower Extremity Ankle Circles/Pumps: AROM;Both;10 reps;Seated   PT Diagnosis:    PT Problem List:   PT Treatment Interventions:     PT Goals (current goals can now be found in the care plan section) Acute Rehab PT Goals Patient Stated Goal: return home PT Goal Formulation: With patient Time For Goal Achievement: 04/28/13 Potential to Achieve Goals: Good  Visit Information  Last PT Received On: 04/23/13 Assistance Needed: +1 History of Present Illness: [name] is a 67 y/o F with history of CAD s/p MI/RCA stent 2005, aortic stenosis, embolic CVA 123XX123, PAF on Coumadin, DM, pericardial effusion, and CKD (ESRD due to HTN/DM -> HD then renal transplant 2012 followed at [facility]) who presented to [facility] today for planned direct admission for pre-cath hydration. Pt with severe aortic stenosis now s/p AVR on 9/24.      Subjective Data  Subjective: pt reasy to  go home to dog Patient Stated Goal: return home   Cognition  Cognition Arousal/Alertness: Awake/alert Behavior During Therapy: WFL for  tasks assessed/performed Overall Cognitive Status: Within Functional Limits for tasks assessed    Balance  Balance Balance Assessed: Yes Static Standing Balance Static Standing - Balance Support: No upper extremity supported;During functional activity Static Standing - Level of Assistance: 5: Stand by assistance Dynamic Standing Balance Dynamic Standing - Balance Support: No upper extremity supported;During functional activity Dynamic Standing - Level of Assistance: 5: Stand by assistance  End of Session PT - End of Session Activity Tolerance: Patient tolerated treatment well Patient left: in chair;with call bell/phone within reach;with family/visitor present Nurse Communication: Mobility status   GP   [name], PT  Acute Rehab Services  [place], [place] 04/23/2013, 2:15 PM

## 2013-04-24 ENCOUNTER — Telehealth: Payer: Self-pay | Admitting: *Deleted

## 2013-04-24 NOTE — Telephone Encounter (Signed)
Called [name] back inform her pt hasn't seen md since 02/29/12 will need hosp f/u to get current info that she is needing...lmb

## 2013-04-24 NOTE — Telephone Encounter (Signed)
Left msg on triage stating pt has been d/c from [facility]. Req rx for a rollator walker. Also need pt weight, height and dx...lmb

## 2013-04-25 ENCOUNTER — Ambulatory Visit (INDEPENDENT_AMBULATORY_CARE_PROVIDER_SITE_OTHER): Payer: Medicare Other | Admitting: Cardiology

## 2013-04-25 DIAGNOSIS — E1129 Type 2 diabetes mellitus with other diabetic kidney complication: Secondary | ICD-10-CM | POA: Diagnosis not present

## 2013-04-25 DIAGNOSIS — I4891 Unspecified atrial fibrillation: Secondary | ICD-10-CM

## 2013-04-25 DIAGNOSIS — I252 Old myocardial infarction: Secondary | ICD-10-CM | POA: Diagnosis not present

## 2013-04-25 DIAGNOSIS — Z8673 Personal history of transient ischemic attack (TIA), and cerebral infarction without residual deficits: Secondary | ICD-10-CM | POA: Diagnosis not present

## 2013-04-25 DIAGNOSIS — I2510 Atherosclerotic heart disease of native coronary artery without angina pectoris: Secondary | ICD-10-CM | POA: Diagnosis not present

## 2013-04-25 DIAGNOSIS — N186 End stage renal disease: Secondary | ICD-10-CM | POA: Diagnosis not present

## 2013-04-25 DIAGNOSIS — Z48812 Encounter for surgical aftercare following surgery on the circulatory system: Secondary | ICD-10-CM | POA: Diagnosis not present

## 2013-04-25 DIAGNOSIS — Z7901 Long term (current) use of anticoagulants: Secondary | ICD-10-CM

## 2013-04-27 DIAGNOSIS — Z48812 Encounter for surgical aftercare following surgery on the circulatory system: Secondary | ICD-10-CM | POA: Diagnosis not present

## 2013-04-27 DIAGNOSIS — N186 End stage renal disease: Secondary | ICD-10-CM | POA: Diagnosis not present

## 2013-04-27 DIAGNOSIS — I252 Old myocardial infarction: Secondary | ICD-10-CM | POA: Diagnosis not present

## 2013-04-27 DIAGNOSIS — I4891 Unspecified atrial fibrillation: Secondary | ICD-10-CM | POA: Diagnosis not present

## 2013-04-27 DIAGNOSIS — I2510 Atherosclerotic heart disease of native coronary artery without angina pectoris: Secondary | ICD-10-CM | POA: Diagnosis not present

## 2013-04-27 DIAGNOSIS — E1129 Type 2 diabetes mellitus with other diabetic kidney complication: Secondary | ICD-10-CM | POA: Diagnosis not present

## 2013-04-30 ENCOUNTER — Telehealth: Payer: Self-pay | Admitting: *Deleted

## 2013-04-30 ENCOUNTER — Telehealth: Payer: Self-pay | Admitting: Cardiology

## 2013-04-30 ENCOUNTER — Ambulatory Visit (INDEPENDENT_AMBULATORY_CARE_PROVIDER_SITE_OTHER): Payer: Medicare Other | Admitting: Cardiology

## 2013-04-30 DIAGNOSIS — N186 End stage renal disease: Secondary | ICD-10-CM | POA: Diagnosis not present

## 2013-04-30 DIAGNOSIS — I4891 Unspecified atrial fibrillation: Secondary | ICD-10-CM | POA: Diagnosis not present

## 2013-04-30 DIAGNOSIS — I2510 Atherosclerotic heart disease of native coronary artery without angina pectoris: Secondary | ICD-10-CM | POA: Diagnosis not present

## 2013-04-30 DIAGNOSIS — Z7901 Long term (current) use of anticoagulants: Secondary | ICD-10-CM

## 2013-04-30 DIAGNOSIS — E1129 Type 2 diabetes mellitus with other diabetic kidney complication: Secondary | ICD-10-CM | POA: Diagnosis not present

## 2013-04-30 DIAGNOSIS — Z48812 Encounter for surgical aftercare following surgery on the circulatory system: Secondary | ICD-10-CM | POA: Diagnosis not present

## 2013-04-30 DIAGNOSIS — I252 Old myocardial infarction: Secondary | ICD-10-CM | POA: Diagnosis not present

## 2013-04-30 LAB — POCT INR: INR: 2

## 2013-04-30 NOTE — Telephone Encounter (Signed)
New problem     Patient of C/O new numbness and tingling to bilateral ex termites started on yesterday.    Blood sugar 200-300 ranges since discharge from hospital. PCP was notified.   Blood sugar Today  236  B/p today 160/60 , hr 62 , oxy 94  %  , temp  99.4 last week 99.5 . Check by patient was  98.   Incision is healing well.

## 2013-04-30 NOTE — Telephone Encounter (Signed)
Does she have insulin? Can she give herself shots? Does she have nurse visits at home? When can I see her in the office? Thx

## 2013-04-30 NOTE — Telephone Encounter (Signed)
Heather called states pts blood sugars have been running in the 200-300s since her discharge from hospital only take oral medications.  Pt further reports she is having new bilateral lower extremity numbness.  Please advise

## 2013-05-01 ENCOUNTER — Encounter: Payer: Self-pay | Admitting: Internal Medicine

## 2013-05-01 ENCOUNTER — Ambulatory Visit (INDEPENDENT_AMBULATORY_CARE_PROVIDER_SITE_OTHER): Payer: Medicare Other | Admitting: Internal Medicine

## 2013-05-01 VITALS — BP 140/90 | HR 80 | Temp 99.0°F | Resp 16 | Wt 177.0 lb

## 2013-05-01 DIAGNOSIS — I4891 Unspecified atrial fibrillation: Secondary | ICD-10-CM | POA: Diagnosis not present

## 2013-05-01 DIAGNOSIS — I10 Essential (primary) hypertension: Secondary | ICD-10-CM

## 2013-05-01 DIAGNOSIS — I2510 Atherosclerotic heart disease of native coronary artery without angina pectoris: Secondary | ICD-10-CM

## 2013-05-01 DIAGNOSIS — I252 Old myocardial infarction: Secondary | ICD-10-CM | POA: Diagnosis not present

## 2013-05-01 DIAGNOSIS — N186 End stage renal disease: Secondary | ICD-10-CM | POA: Diagnosis not present

## 2013-05-01 DIAGNOSIS — E119 Type 2 diabetes mellitus without complications: Secondary | ICD-10-CM | POA: Diagnosis not present

## 2013-05-01 DIAGNOSIS — Z48812 Encounter for surgical aftercare following surgery on the circulatory system: Secondary | ICD-10-CM | POA: Diagnosis not present

## 2013-05-01 DIAGNOSIS — E1129 Type 2 diabetes mellitus with other diabetic kidney complication: Secondary | ICD-10-CM | POA: Diagnosis not present

## 2013-05-01 MED ORDER — OXYCODONE HCL 5 MG PO TABS: 5.0000 mg | ORAL_TABLET | Freq: Four times a day (QID) | ORAL | Status: DC | PRN

## 2013-05-01 MED ORDER — GLIPIZIDE ER 10 MG PO TB24: 10.0000 mg | ORAL_TABLET | Freq: Every day | ORAL | Status: DC

## 2013-05-01 MED ORDER — INSULIN ASPART 100 UNIT/ML FLEXPEN: 4.0000 [IU] | PEN_INJECTOR | Freq: Three times a day (TID) | SUBCUTANEOUS | Status: DC

## 2013-05-01 MED ORDER — GLUCOSE BLOOD VI STRP: ORAL_STRIP | Status: DC

## 2013-05-01 NOTE — Telephone Encounter (Signed)
Left message for heather to call  

## 2013-05-01 NOTE — Progress Notes (Signed)
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  Pt with severe aortic stenosis now s/p AVR on 04/11/13.  C/o elev CBG: 230s ac and 340s pc    Diabetes Hypoglycemia symptoms include nervousness/anxiousness. Pertinent negatives for hypoglycemia include no confusion, dizziness, headaches, pallor or tremors. Associated symptoms include fatigue. Pertinent negatives for diabetes include no weakness.  Rash This is a chronic problem. The current episode started more than 1 year ago. The problem has been gradually worsening since onset. The affected locations include the chest, right wrist, right arm, left wrist, right lower leg and right upper leg. She was exposed to nothing. Associated symptoms include fatigue. Pertinent negatives include no congestion or cough. Past treatments include anti-itch cream and topical steroids. The treatment provided mild relief. Her past medical history is significant for eczema.    The patient presents for a follow-up of  Chronic ESRD, hypertension, chronic dyslipidemia, type 2 diabetes not controlled with medicines. She just had a renal tranplant in 8/12. F/u itching and rash - not better.    BP Readings from Last 3 Encounters:  05/01/13 140/90  04/23/13 113/97  04/23/13 113/97   Wt Readings from Last 3 Encounters:  05/01/13 177 lb (80.287 kg)  04/23/13 182 lb 5.1 oz (82.7 kg)  04/23/13 182 lb 5.1 oz (82.7 kg)     Review of Systems  Constitutional: Positive for appetite change, fatigue and unexpected weight change (wt gain). Negative for chills and activity change.  HENT: Negative for congestion, mouth sores and sinus pressure.   Eyes: Negative for visual disturbance.  Respiratory: Negative for cough and chest tightness.   Gastrointestinal: Negative  for nausea and abdominal pain.  Genitourinary: Negative for frequency, difficulty urinating and vaginal pain.  Musculoskeletal: Positive for arthralgias and gait problem. Negative for back pain.  Skin: Positive for rash. Negative for pallor.       pruritis  Neurological: Negative for dizziness, tremors, weakness, numbness and headaches.  Psychiatric/Behavioral: Negative for confusion and sleep disturbance. The patient is nervous/anxious.        Objective:   Physical Exam  Constitutional: No distress.  Chronically ill appearing -- in a w/c  HENT:  Head: Normocephalic.  Right Ear: External ear normal.  Left Ear: External ear normal.  Nose: Nose normal.  Mouth/Throat: Oropharynx is clear and moist.  Eyes: Conjunctivae are normal. Pupils are equal, round, and reactive to light. Right eye exhibits no discharge. Left eye exhibits no discharge.  Neck: Normal range of motion. Neck supple. No JVD present. No tracheal deviation present. No thyromegaly present.  Cardiovascular: Normal rate and regular rhythm.  Exam reveals no gallop and no friction rub.   Murmur (2/6) heard. Pulmonary/Chest: No stridor. No respiratory distress. She has no wheezes.  Abdominal: Soft. Bowel sounds are normal. She exhibits no distension and no mass. There is no tenderness. There is no rebound and no guarding.  Musculoskeletal: She exhibits no edema and no tenderness.  Lymphadenopathy:    She has no cervical adenopathy.  Neurological: She displays normal reflexes. No cranial nerve deficit. She exhibits normal muscle tone. Coordination normal.  Skin: Rash (hives and hyperpigmentation on LE and UEs B) noted. No erythema.  Diffuse nodular excoriations on back Eczema type rash on B shins  Psychiatric: She  has a normal mood and affect. Her behavior is normal. Thought content normal.  Sad a little  R knee is tender B abd poll longus -- tender Graft in L arm seems to be ok  Lab Results  Component Value Date   WBC  13.9* 04/22/2013   HGB 9.6* 04/22/2013   HCT 26.2* 04/22/2013   PLT 316 04/22/2013   GLUCOSE 161* 04/22/2013   CHOL 164 04/06/2013   TRIG 125 04/06/2013   HDL 73 04/06/2013   LDLDIRECT 89.3 06/13/2006   LDLCALC 66 04/06/2013   ALT 97* 04/15/2013   AST 72* 04/15/2013   NA 135 04/22/2013   K 3.5 04/22/2013   CL 93* 04/22/2013   CREATININE 2.51* 04/22/2013   BUN 54* 04/22/2013   CO2 26 04/22/2013   TSH 1.76 05/25/2012   INR 2.0 04/30/2013   HGBA1C 6.9* 04/07/2013   MICROALBUR 168.6* 10/31/2006       Assessment & Plan:

## 2013-05-01 NOTE — Telephone Encounter (Signed)
Spoke with pt scheduled appoint 10.14.14 @ 3:45pm

## 2013-05-03 DIAGNOSIS — I2510 Atherosclerotic heart disease of native coronary artery without angina pectoris: Secondary | ICD-10-CM | POA: Diagnosis not present

## 2013-05-03 DIAGNOSIS — Z48812 Encounter for surgical aftercare following surgery on the circulatory system: Secondary | ICD-10-CM | POA: Diagnosis not present

## 2013-05-03 DIAGNOSIS — I4891 Unspecified atrial fibrillation: Secondary | ICD-10-CM | POA: Diagnosis not present

## 2013-05-03 DIAGNOSIS — E1129 Type 2 diabetes mellitus with other diabetic kidney complication: Secondary | ICD-10-CM | POA: Diagnosis not present

## 2013-05-03 DIAGNOSIS — I252 Old myocardial infarction: Secondary | ICD-10-CM | POA: Diagnosis not present

## 2013-05-03 DIAGNOSIS — N186 End stage renal disease: Secondary | ICD-10-CM | POA: Diagnosis not present

## 2013-05-04 DIAGNOSIS — I4891 Unspecified atrial fibrillation: Secondary | ICD-10-CM | POA: Diagnosis not present

## 2013-05-04 DIAGNOSIS — I2510 Atherosclerotic heart disease of native coronary artery without angina pectoris: Secondary | ICD-10-CM | POA: Diagnosis not present

## 2013-05-04 DIAGNOSIS — E1129 Type 2 diabetes mellitus with other diabetic kidney complication: Secondary | ICD-10-CM | POA: Diagnosis not present

## 2013-05-04 DIAGNOSIS — I252 Old myocardial infarction: Secondary | ICD-10-CM | POA: Diagnosis not present

## 2013-05-04 DIAGNOSIS — Z48812 Encounter for surgical aftercare following surgery on the circulatory system: Secondary | ICD-10-CM | POA: Diagnosis not present

## 2013-05-04 DIAGNOSIS — N186 End stage renal disease: Secondary | ICD-10-CM | POA: Diagnosis not present

## 2013-05-07 DIAGNOSIS — I252 Old myocardial infarction: Secondary | ICD-10-CM | POA: Diagnosis not present

## 2013-05-07 DIAGNOSIS — I2510 Atherosclerotic heart disease of native coronary artery without angina pectoris: Secondary | ICD-10-CM | POA: Diagnosis not present

## 2013-05-07 DIAGNOSIS — I4891 Unspecified atrial fibrillation: Secondary | ICD-10-CM | POA: Diagnosis not present

## 2013-05-07 DIAGNOSIS — E1129 Type 2 diabetes mellitus with other diabetic kidney complication: Secondary | ICD-10-CM | POA: Diagnosis not present

## 2013-05-07 DIAGNOSIS — Z48812 Encounter for surgical aftercare following surgery on the circulatory system: Secondary | ICD-10-CM | POA: Diagnosis not present

## 2013-05-07 DIAGNOSIS — N186 End stage renal disease: Secondary | ICD-10-CM | POA: Diagnosis not present

## 2013-05-08 ENCOUNTER — Other Ambulatory Visit: Payer: Self-pay | Admitting: *Deleted

## 2013-05-08 ENCOUNTER — Ambulatory Visit (INDEPENDENT_AMBULATORY_CARE_PROVIDER_SITE_OTHER): Payer: Medicare Other | Admitting: Cardiology

## 2013-05-08 DIAGNOSIS — Z7901 Long term (current) use of anticoagulants: Secondary | ICD-10-CM

## 2013-05-08 DIAGNOSIS — I2510 Atherosclerotic heart disease of native coronary artery without angina pectoris: Secondary | ICD-10-CM

## 2013-05-08 DIAGNOSIS — I4891 Unspecified atrial fibrillation: Secondary | ICD-10-CM | POA: Diagnosis not present

## 2013-05-08 DIAGNOSIS — N186 End stage renal disease: Secondary | ICD-10-CM | POA: Diagnosis not present

## 2013-05-08 DIAGNOSIS — Z48812 Encounter for surgical aftercare following surgery on the circulatory system: Secondary | ICD-10-CM | POA: Diagnosis not present

## 2013-05-08 DIAGNOSIS — I350 Nonrheumatic aortic (valve) stenosis: Secondary | ICD-10-CM

## 2013-05-08 DIAGNOSIS — E1129 Type 2 diabetes mellitus with other diabetic kidney complication: Secondary | ICD-10-CM | POA: Diagnosis not present

## 2013-05-08 DIAGNOSIS — I252 Old myocardial infarction: Secondary | ICD-10-CM | POA: Diagnosis not present

## 2013-05-08 NOTE — Telephone Encounter (Signed)
Pt has been seen by PCP since this call

## 2013-05-09 ENCOUNTER — Encounter: Payer: Self-pay | Admitting: Internal Medicine

## 2013-05-09 ENCOUNTER — Ambulatory Visit (INDEPENDENT_AMBULATORY_CARE_PROVIDER_SITE_OTHER): Payer: Medicare Other | Admitting: Internal Medicine

## 2013-05-09 VITALS — BP 138/80 | HR 80 | Temp 98.5°F | Resp 16 | Wt 178.0 lb

## 2013-05-09 DIAGNOSIS — R5381 Other malaise: Secondary | ICD-10-CM

## 2013-05-09 DIAGNOSIS — Z952 Presence of prosthetic heart valve: Secondary | ICD-10-CM | POA: Diagnosis not present

## 2013-05-09 DIAGNOSIS — Z48812 Encounter for surgical aftercare following surgery on the circulatory system: Secondary | ICD-10-CM | POA: Diagnosis not present

## 2013-05-09 DIAGNOSIS — N19 Unspecified kidney failure: Secondary | ICD-10-CM | POA: Diagnosis not present

## 2013-05-09 DIAGNOSIS — I2510 Atherosclerotic heart disease of native coronary artery without angina pectoris: Secondary | ICD-10-CM | POA: Diagnosis not present

## 2013-05-09 DIAGNOSIS — N186 End stage renal disease: Secondary | ICD-10-CM | POA: Diagnosis not present

## 2013-05-09 DIAGNOSIS — E119 Type 2 diabetes mellitus without complications: Secondary | ICD-10-CM | POA: Diagnosis not present

## 2013-05-09 DIAGNOSIS — I4891 Unspecified atrial fibrillation: Secondary | ICD-10-CM | POA: Diagnosis not present

## 2013-05-09 DIAGNOSIS — I252 Old myocardial infarction: Secondary | ICD-10-CM | POA: Diagnosis not present

## 2013-05-09 DIAGNOSIS — E1129 Type 2 diabetes mellitus with other diabetic kidney complication: Secondary | ICD-10-CM | POA: Diagnosis not present

## 2013-05-09 DIAGNOSIS — Z953 Presence of xenogenic heart valve: Secondary | ICD-10-CM

## 2013-05-09 MED ORDER — INSULIN LISPRO 100 UNIT/ML ~~LOC~~ SOLN: 8.0000 [IU] | Freq: Three times a day (TID) | SUBCUTANEOUS | Status: DC

## 2013-05-09 MED ORDER — INSULIN ASPART 100 UNIT/ML FLEXPEN: 8.0000 [IU] | PEN_INJECTOR | Freq: Three times a day (TID) | SUBCUTANEOUS | Status: DC

## 2013-05-09 NOTE — Assessment & Plan Note (Signed)
Continue with current prescription therapy as reflected on the Med list.  

## 2013-05-09 NOTE — Assessment & Plan Note (Signed)
Better  

## 2013-05-09 NOTE — Assessment & Plan Note (Signed)
A little better Will double insulin dose Problems w/insurance coverage - being addressed

## 2013-05-09 NOTE — Assessment & Plan Note (Signed)
Novolog pen is not covered (?) Will try Humalog pen 8 u tid-qid

## 2013-05-09 NOTE — Progress Notes (Signed)
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  Pt with severe aortic stenosis now s/p AVR on 04/11/13.  F/u elev CBG: 200s ac and 300s pc: using Insulin tid - better CBGs. No low CBGs  Novolog pen is not covered (?)   Diabetes Hypoglycemia symptoms include nervousness/anxiousness. Pertinent negatives for hypoglycemia include no confusion, dizziness, headaches, pallor or tremors. Associated symptoms include fatigue. Pertinent negatives for diabetes include no weakness.  Rash This is a chronic problem. The current episode started more than 1 year ago. The problem has been gradually worsening since onset. The affected locations include the chest, right wrist, right arm, left wrist, right lower leg and right upper leg. She was exposed to nothing. Associated symptoms include fatigue. Pertinent negatives include no congestion or cough. Past treatments include anti-itch cream and topical steroids. The treatment provided mild relief. Her past medical history is significant for eczema.    The patient presents for a follow-up of  Chronic ESRD, hypertension, chronic dyslipidemia, type 2 diabetes not controlled with medicines. She just had a renal tranplant in 8/12. F/u itching and rash - not better.    BP Readings from Last 3 Encounters:  05/09/13 138/80  05/01/13 140/90  04/23/13 113/97   Wt Readings from Last 3 Encounters:  05/09/13 178 lb (80.74 kg)  05/01/13 177 lb (80.287 kg)  04/23/13 182 lb 5.1 oz (82.7 kg)     Review of Systems  Constitutional: Positive for fatigue. Negative for chills, activity change, appetite change and unexpected weight change.  HENT: Negative for congestion, mouth sores and sinus pressure.   Eyes: Negative for visual disturbance.  Respiratory:  Negative for cough and chest tightness.   Gastrointestinal: Negative for nausea and abdominal pain.  Genitourinary: Negative for frequency, difficulty urinating and vaginal pain.  Musculoskeletal: Positive for arthralgias and gait problem. Negative for back pain.  Skin: Negative for pallor and rash.       pruritis  Neurological: Negative for dizziness, tremors, weakness, numbness and headaches.  Psychiatric/Behavioral: Negative for confusion and sleep disturbance. The patient is nervous/anxious.        Objective:   Physical Exam  Constitutional: No distress.  Looks much better  HENT:  Head: Normocephalic.  Right Ear: External ear normal.  Left Ear: External ear normal.  Nose: Nose normal.  Mouth/Throat: Oropharynx is clear and moist.  Eyes: Conjunctivae are normal. Pupils are equal, round, and reactive to light. Right eye exhibits no discharge. Left eye exhibits no discharge.  Neck: Normal range of motion. Neck supple. No JVD present. No tracheal deviation present. No thyromegaly present.  Cardiovascular: Normal rate and regular rhythm.  Exam reveals no gallop and no friction rub.   Murmur (2/6) heard. Pulmonary/Chest: No stridor. No respiratory distress. She has no wheezes.  Abdominal: Soft. Bowel sounds are normal. She exhibits no distension and no mass. There is no tenderness. There is no rebound and no guarding.  Musculoskeletal: She exhibits no edema and no tenderness.  Walking w/a cane  Lymphadenopathy:    She has no cervical adenopathy.  Neurological: She displays normal reflexes. No cranial nerve deficit. She exhibits normal muscle tone. Coordination normal.  Skin: No rash (hives and hyperpigmentation on LE and UEs B) noted. No erythema.  Scar on chest  and   Psychiatric: She has a normal mood and affect. Her behavior is normal. Thought content normal.   Graft in L arm seems to be ok  Lab Results  Component Value Date   WBC 13.9* 04/22/2013   HGB 9.6* 04/22/2013   HCT  26.2* 04/22/2013   PLT 316 04/22/2013   GLUCOSE 161* 04/22/2013   CHOL 164 04/06/2013   TRIG 125 04/06/2013   HDL 73 04/06/2013   LDLDIRECT 89.3 06/13/2006   LDLCALC 66 04/06/2013   ALT 97* 04/15/2013   AST 72* 04/15/2013   NA 135 04/22/2013   K 3.5 04/22/2013   CL 93* 04/22/2013   CREATININE 2.51* 04/22/2013   BUN 54* 04/22/2013   CO2 26 04/22/2013   TSH 1.76 05/25/2012   INR 3.9 05/08/2013   HGBA1C 6.9* 04/07/2013   MICROALBUR 168.6* 10/31/2006       Assessment & Plan:

## 2013-05-09 NOTE — Assessment & Plan Note (Addendum)
Doing better post-op 

## 2013-05-09 NOTE — Assessment & Plan Note (Signed)
Will watch 

## 2013-05-10 DIAGNOSIS — I252 Old myocardial infarction: Secondary | ICD-10-CM | POA: Diagnosis not present

## 2013-05-10 DIAGNOSIS — I2510 Atherosclerotic heart disease of native coronary artery without angina pectoris: Secondary | ICD-10-CM | POA: Diagnosis not present

## 2013-05-10 DIAGNOSIS — D899 Disorder involving the immune mechanism, unspecified: Secondary | ICD-10-CM | POA: Diagnosis not present

## 2013-05-10 DIAGNOSIS — Z940 Kidney transplant status: Secondary | ICD-10-CM | POA: Diagnosis not present

## 2013-05-10 DIAGNOSIS — I10 Essential (primary) hypertension: Secondary | ICD-10-CM | POA: Diagnosis not present

## 2013-05-10 DIAGNOSIS — I359 Nonrheumatic aortic valve disorder, unspecified: Secondary | ICD-10-CM | POA: Diagnosis not present

## 2013-05-10 DIAGNOSIS — Z48812 Encounter for surgical aftercare following surgery on the circulatory system: Secondary | ICD-10-CM | POA: Diagnosis not present

## 2013-05-10 DIAGNOSIS — E1129 Type 2 diabetes mellitus with other diabetic kidney complication: Secondary | ICD-10-CM | POA: Diagnosis not present

## 2013-05-10 DIAGNOSIS — I4891 Unspecified atrial fibrillation: Secondary | ICD-10-CM | POA: Diagnosis not present

## 2013-05-10 DIAGNOSIS — N186 End stage renal disease: Secondary | ICD-10-CM | POA: Diagnosis not present

## 2013-05-11 DIAGNOSIS — I2510 Atherosclerotic heart disease of native coronary artery without angina pectoris: Secondary | ICD-10-CM | POA: Diagnosis not present

## 2013-05-11 DIAGNOSIS — N186 End stage renal disease: Secondary | ICD-10-CM | POA: Diagnosis not present

## 2013-05-11 DIAGNOSIS — Z48812 Encounter for surgical aftercare following surgery on the circulatory system: Secondary | ICD-10-CM | POA: Diagnosis not present

## 2013-05-11 DIAGNOSIS — I4891 Unspecified atrial fibrillation: Secondary | ICD-10-CM | POA: Diagnosis not present

## 2013-05-11 DIAGNOSIS — I252 Old myocardial infarction: Secondary | ICD-10-CM | POA: Diagnosis not present

## 2013-05-11 DIAGNOSIS — E1129 Type 2 diabetes mellitus with other diabetic kidney complication: Secondary | ICD-10-CM | POA: Diagnosis not present

## 2013-05-14 ENCOUNTER — Encounter: Payer: Self-pay | Admitting: Thoracic Surgery (Cardiothoracic Vascular Surgery)

## 2013-05-14 ENCOUNTER — Ambulatory Visit
Admission: RE | Admit: 2013-05-14 | Discharge: 2013-05-14 | Disposition: A | Discharge status: Home / Self Care | Payer: Medicare Other | Source: Ambulatory Visit | Attending: Thoracic Surgery (Cardiothoracic Vascular Surgery) | Admitting: Thoracic Surgery (Cardiothoracic Vascular Surgery)

## 2013-05-14 ENCOUNTER — Ambulatory Visit (INDEPENDENT_AMBULATORY_CARE_PROVIDER_SITE_OTHER): Payer: Medicare Other | Admitting: Thoracic Surgery (Cardiothoracic Vascular Surgery)

## 2013-05-14 VITALS — BP 144/84 | HR 72 | Resp 16 | Wt 175.0 lb

## 2013-05-14 DIAGNOSIS — I350 Nonrheumatic aortic (valve) stenosis: Secondary | ICD-10-CM

## 2013-05-14 DIAGNOSIS — I359 Nonrheumatic aortic valve disorder, unspecified: Secondary | ICD-10-CM

## 2013-05-14 DIAGNOSIS — Z8679 Personal history of other diseases of the circulatory system: Secondary | ICD-10-CM

## 2013-05-14 DIAGNOSIS — Z954 Presence of other heart-valve replacement: Secondary | ICD-10-CM

## 2013-05-14 DIAGNOSIS — Z952 Presence of prosthetic heart valve: Secondary | ICD-10-CM | POA: Insufficient documentation

## 2013-05-14 DIAGNOSIS — I4891 Unspecified atrial fibrillation: Secondary | ICD-10-CM

## 2013-05-14 DIAGNOSIS — Z9889 Other specified postprocedural states: Secondary | ICD-10-CM

## 2013-05-14 DIAGNOSIS — I2510 Atherosclerotic heart disease of native coronary artery without angina pectoris: Secondary | ICD-10-CM

## 2013-05-14 DIAGNOSIS — J90 Pleural effusion, not elsewhere classified: Secondary | ICD-10-CM | POA: Diagnosis not present

## 2013-05-14 NOTE — Patient Instructions (Signed)
The patient should continue to avoid any heavy lifting or strenuous use of arms or shoulders for at least a total of three months from the time of surgery.  

## 2013-05-14 NOTE — Progress Notes (Signed)
[address]       [place] [address]             [phone]     CARDIOTHORACIC SURGERY OFFICE NOTE  Referring Provider is Lelon Perla, MD PCP is Walker Kehr, MD   HPI:  Patient returns for routine followup status post aortic valve replacement using a bioprosthetic tissue valve and Maze procedure on 04/12/2013. Her surgery was originally scheduled for 04/11/2013, but she experienced erroneous placement of a large-bore introducing sheath into the right carotid artery by the anesthesia team while they were preparing her for surgery, and this required surgical repair of her right common carotid artery. She recovered from this uneventfully and subsequently underwent aortic valve replacement and Maze procedure the following day.  Her subsequent postoperative recovery was remarkably uncomplicated. Since hospital discharge the patient has been seen on several occasions by Dr. Alain Marion who has been adjusting her medical therapy for diabetes mellitus. Patient returns for office today for routine followup. She reports that she is doing remarkably well. She has mild residual soreness in her chest which continues to gradually improve. She is only using pain relievers occasionally. She notes that her breathing is already considerably better than it was prior to surgery. She can go up a flight of stairs at home without getting short of breath.  Her appetite is still somewhat marginal but she seems to be in adequately. She states that her blood sugars have been up and down but she is now using insulin to control the better. She is sleeping well at night. She plans to start the cardiac rehabilitation program next week. Overall she is delighted with her progress.  Current Outpatient Prescriptions  Medication Sig Dispense Refill  . amiodarone (PACERONE) 200 MG tablet Take 1 tablet (200 mg total) by mouth 2 (two) times daily after a meal.  60 tablet  1  . cinacalcet (SENSIPAR) 60  MG tablet Take 60 mg by mouth every other day.      . dapsone 25 MG tablet Take 25 mg by mouth daily.       . diphenhydrAMINE (BENADRYL) 50 MG capsule Take 1 capsule (50 mg total) by mouth every 6 (six) hours as needed for itching.  90 capsule  3  . Flurandrenolide 4 MCG/SQCM TAPE Apply topically daily.      . furosemide (LASIX) 80 MG tablet Take 1 tablet (80 mg total) by mouth 2 (two) times daily.  60 tablet  1  . glipiZIDE (GLUCOTROL XL) 10 MG 24 hr tablet Take 1 tablet (10 mg total) by mouth daily.  30 tablet  11  . glucose blood test strip Use as instructed qid ac and hs  100 each  12  . insulin lispro (HUMALOG) 100 UNIT/ML injection Inject 8 Units into the skin 3 (three) times daily before meals. Use 8 units sq at hs if glucose is >150 (4th injection)  10 mL  12  . metoprolol tartrate (LOPRESSOR) 25 MG tablet Take 25 mg by mouth 2 (two) times daily.      . mycophenolate (MYFORTIC) 180 MG EC tablet Take 360 mg by mouth 2 (two) times daily.       Marland Kitchen oxyCODONE (OXY IR/ROXICODONE) 5 MG immediate release tablet Take 1-2 tablets (5-10 mg total) by mouth every 6 (six) hours as needed for pain (pain).  120 tablet  0  . pravastatin (PRAVACHOL) 40 MG tablet Take 40 mg by mouth daily. Take one tablet  Mon, Wed, and Friday      . tacrolimus (PROGRAF) 1 MG capsule Take 5 mg by mouth 2 (two) times daily. Take 5 capsules in the am, and 5 capsules in the pm.      . triamcinolone cream (KENALOG) 0.1 % Apply 1 application topically 2 (two) times daily.       Marland Kitchen warfarin (COUMADIN) 4 MG tablet Take 5 mg by mouth daily. Take Coumadin 4 mg po every Monday Wednesday, Friday. Take Coumadin 2 mg every Tuesday, Thursday, Saturday, and Sunday.       No current facility-administered medications for this visit.      Physical Exam:   BP 144/84  Pulse 72  Resp 16  Wt 175 lb (79.379 kg)  BMI 32 kg/m2  SpO2 97%  General:  Well-appearing  Chest:   Clear to auscultation with symmetrical breath sounds  CV:   Regular  rate and rhythm with soft systolic murmur  Incisions:  Clean and dry and healing nicely, sternum is stable  Abdomen:  Soft nontender  Extremities:  Warm and well-perfused with no lower extremity edema  Diagnostic Tests:  2 channel telemetry rhythm strip demonstrates what appears to be sinus rhythm or junctional rhythm. Rhythm is narrow complex and regular. Discrete P waves or not easily appreciated.    CXR:  CLINICAL DATA:  Followup of coronary artery disease. Aortic valve repair. Hypertension. Diabetes.   EXAM: CHEST  2 VIEW   COMPARISON:  05/17/2013   FINDINGS: Prior median sternotomy. Lateral view degraded by patient arm position. Prior aortic valve repair and left atrial appendage occlusion device placement.   Midline trachea. Mild cardiomegaly with a tortuous thoracic aorta. Interval resolution of previously described bilateral pleural effusions. Minimal right hemidiaphragm elevation. No pneumothorax. No congestive failure. Improved bibasilar aeration. Minimal atelectasis remains in the right mid lung.   IMPRESSION: Cardiomegaly with resolution of bilateral pleural effusions and improvement in bibasilar aeration.     Electronically Signed   By: Abigail Miyamoto M.D.   On: 05/14/2013 12:05    Impression:  Patient is doing remarkably well just 4 weeks status post aortic valve replacement using a bioprosthetic tissue valve and Maze procedure. She is maintaining sinus rhythm.    Plan:  I have encouraged patient to continue to gradually increase her physical activity as tolerated with her primary limitation remaining that she refrain from any heavy lifting or strenuous use of her arms or shoulder for at least another 2 months. I've encouraged her to go ahead and get started in the cardiac rehabilitation program. We have not made any changes in her current medications. We will plan to see her back in 3 months for routine followup and rhythm check.   Valentina Gu. Roxy Manns,  MD 05/14/2013 12:30 PM

## 2013-05-15 ENCOUNTER — Ambulatory Visit (INDEPENDENT_AMBULATORY_CARE_PROVIDER_SITE_OTHER): Payer: Medicare Other | Admitting: Pharmacist

## 2013-05-15 DIAGNOSIS — Z7901 Long term (current) use of anticoagulants: Secondary | ICD-10-CM

## 2013-05-15 DIAGNOSIS — I2510 Atherosclerotic heart disease of native coronary artery without angina pectoris: Secondary | ICD-10-CM | POA: Diagnosis not present

## 2013-05-15 DIAGNOSIS — I252 Old myocardial infarction: Secondary | ICD-10-CM | POA: Diagnosis not present

## 2013-05-15 DIAGNOSIS — I4891 Unspecified atrial fibrillation: Secondary | ICD-10-CM | POA: Diagnosis not present

## 2013-05-15 DIAGNOSIS — E1129 Type 2 diabetes mellitus with other diabetic kidney complication: Secondary | ICD-10-CM | POA: Diagnosis not present

## 2013-05-15 DIAGNOSIS — N186 End stage renal disease: Secondary | ICD-10-CM | POA: Diagnosis not present

## 2013-05-15 DIAGNOSIS — Z48812 Encounter for surgical aftercare following surgery on the circulatory system: Secondary | ICD-10-CM | POA: Diagnosis not present

## 2013-05-15 LAB — POCT INR: INR: 4.2

## 2013-05-17 DIAGNOSIS — I252 Old myocardial infarction: Secondary | ICD-10-CM | POA: Diagnosis not present

## 2013-05-17 DIAGNOSIS — N186 End stage renal disease: Secondary | ICD-10-CM | POA: Diagnosis not present

## 2013-05-17 DIAGNOSIS — I2510 Atherosclerotic heart disease of native coronary artery without angina pectoris: Secondary | ICD-10-CM | POA: Diagnosis not present

## 2013-05-17 DIAGNOSIS — Z48812 Encounter for surgical aftercare following surgery on the circulatory system: Secondary | ICD-10-CM

## 2013-05-17 DIAGNOSIS — I4891 Unspecified atrial fibrillation: Secondary | ICD-10-CM | POA: Diagnosis not present

## 2013-05-17 DIAGNOSIS — E1129 Type 2 diabetes mellitus with other diabetic kidney complication: Secondary | ICD-10-CM | POA: Diagnosis not present

## 2013-05-21 DIAGNOSIS — I2510 Atherosclerotic heart disease of native coronary artery without angina pectoris: Secondary | ICD-10-CM | POA: Diagnosis not present

## 2013-05-21 DIAGNOSIS — N186 End stage renal disease: Secondary | ICD-10-CM | POA: Diagnosis not present

## 2013-05-21 DIAGNOSIS — I252 Old myocardial infarction: Secondary | ICD-10-CM | POA: Diagnosis not present

## 2013-05-21 DIAGNOSIS — Z48812 Encounter for surgical aftercare following surgery on the circulatory system: Secondary | ICD-10-CM | POA: Diagnosis not present

## 2013-05-21 DIAGNOSIS — I4891 Unspecified atrial fibrillation: Secondary | ICD-10-CM | POA: Diagnosis not present

## 2013-05-21 DIAGNOSIS — E1129 Type 2 diabetes mellitus with other diabetic kidney complication: Secondary | ICD-10-CM | POA: Diagnosis not present

## 2013-05-22 ENCOUNTER — Telehealth (HOSPITAL_COMMUNITY): Payer: Self-pay | Admitting: *Deleted

## 2013-05-22 ENCOUNTER — Ambulatory Visit (INDEPENDENT_AMBULATORY_CARE_PROVIDER_SITE_OTHER): Payer: Medicare Other | Admitting: Cardiology

## 2013-05-22 ENCOUNTER — Ambulatory Visit (HOSPITAL_COMMUNITY): Payer: Medicare Other

## 2013-05-22 DIAGNOSIS — Z7901 Long term (current) use of anticoagulants: Secondary | ICD-10-CM

## 2013-05-22 DIAGNOSIS — Z48812 Encounter for surgical aftercare following surgery on the circulatory system: Secondary | ICD-10-CM | POA: Diagnosis not present

## 2013-05-22 DIAGNOSIS — I2510 Atherosclerotic heart disease of native coronary artery without angina pectoris: Secondary | ICD-10-CM | POA: Diagnosis not present

## 2013-05-22 DIAGNOSIS — I4891 Unspecified atrial fibrillation: Secondary | ICD-10-CM

## 2013-05-22 DIAGNOSIS — E1129 Type 2 diabetes mellitus with other diabetic kidney complication: Secondary | ICD-10-CM | POA: Diagnosis not present

## 2013-05-22 DIAGNOSIS — N186 End stage renal disease: Secondary | ICD-10-CM | POA: Diagnosis not present

## 2013-05-22 DIAGNOSIS — I252 Old myocardial infarction: Secondary | ICD-10-CM | POA: Diagnosis not present

## 2013-05-22 LAB — POCT INR: INR: 8

## 2013-05-23 DIAGNOSIS — E1129 Type 2 diabetes mellitus with other diabetic kidney complication: Secondary | ICD-10-CM | POA: Diagnosis not present

## 2013-05-23 DIAGNOSIS — N186 End stage renal disease: Secondary | ICD-10-CM | POA: Diagnosis not present

## 2013-05-23 DIAGNOSIS — I252 Old myocardial infarction: Secondary | ICD-10-CM | POA: Diagnosis not present

## 2013-05-23 DIAGNOSIS — Z48812 Encounter for surgical aftercare following surgery on the circulatory system: Secondary | ICD-10-CM | POA: Diagnosis not present

## 2013-05-23 DIAGNOSIS — I2510 Atherosclerotic heart disease of native coronary artery without angina pectoris: Secondary | ICD-10-CM | POA: Diagnosis not present

## 2013-05-23 DIAGNOSIS — I4891 Unspecified atrial fibrillation: Secondary | ICD-10-CM | POA: Diagnosis not present

## 2013-05-24 ENCOUNTER — Other Ambulatory Visit: Payer: Self-pay

## 2013-05-24 ENCOUNTER — Ambulatory Visit (HOSPITAL_COMMUNITY): Payer: Medicare Other

## 2013-05-25 ENCOUNTER — Encounter: Payer: Self-pay | Admitting: Cardiology

## 2013-05-25 ENCOUNTER — Ambulatory Visit (INDEPENDENT_AMBULATORY_CARE_PROVIDER_SITE_OTHER): Payer: Medicare Other | Admitting: Cardiology

## 2013-05-25 VITALS — BP 140/70 | HR 60 | Ht 62.0 in | Wt 174.0 lb

## 2013-05-25 DIAGNOSIS — I359 Nonrheumatic aortic valve disorder, unspecified: Secondary | ICD-10-CM

## 2013-05-25 DIAGNOSIS — E785 Hyperlipidemia, unspecified: Secondary | ICD-10-CM | POA: Diagnosis not present

## 2013-05-25 DIAGNOSIS — Z9889 Other specified postprocedural states: Secondary | ICD-10-CM | POA: Diagnosis not present

## 2013-05-25 DIAGNOSIS — I10 Essential (primary) hypertension: Secondary | ICD-10-CM

## 2013-05-25 DIAGNOSIS — Z8679 Personal history of other diseases of the circulatory system: Secondary | ICD-10-CM

## 2013-05-25 DIAGNOSIS — N19 Unspecified kidney failure: Secondary | ICD-10-CM

## 2013-05-25 DIAGNOSIS — I2510 Atherosclerotic heart disease of native coronary artery without angina pectoris: Secondary | ICD-10-CM | POA: Diagnosis not present

## 2013-05-25 DIAGNOSIS — Z79899 Other long term (current) drug therapy: Secondary | ICD-10-CM | POA: Diagnosis not present

## 2013-05-25 MED ORDER — AMIODARONE HCL 200 MG PO TABS: 200.0000 mg | ORAL_TABLET | Freq: Every day | ORAL | Status: DC

## 2013-05-25 NOTE — Patient Instructions (Signed)
Your physician recommends that you schedule a follow-up appointment in: 3 MONTHS WITH DR CRENSHAW  DECREASE AMIODARONE TO 200 MG ONCE DAILY

## 2013-05-25 NOTE — Assessment & Plan Note (Signed)
Continue present blood pressure medications. 

## 2013-05-25 NOTE — Assessment & Plan Note (Signed)
Status post transplant.followed by nephrology.

## 2013-05-25 NOTE — Assessment & Plan Note (Signed)
Continue statin. 

## 2013-05-25 NOTE — Assessment & Plan Note (Signed)
Patient doing well status post aortic valve replacement. We have provided an SBE card today. Her symptoms are markedly improved.

## 2013-05-25 NOTE — Assessment & Plan Note (Signed)
Patient remains in sinus rhythm status post maze procedure. I will decrease amiodarone to 200 mg daily. When she returns in 3 months I will plan TSH, liver functions and chest x-ray. Continue Coumadin. I would be hesitant to discontinue this medication as she has a history of embolic CVA related to atrial fibrillation.

## 2013-05-25 NOTE — Assessment & Plan Note (Signed)
Continue statin.not on aspirin given need for Coumadin.

## 2013-05-25 NOTE — Assessment & Plan Note (Signed)
Plan continue statin. Not on aspirin given need for Coumadin. Repeat carotid Dopplers October 2015.

## 2013-05-25 NOTE — Progress Notes (Signed)
HPI: Pleasant female who presents for fu of aortic stenosis s/p AVR, pericardial effusion and atrial fibrillation. Patient has a history of embolic CVA. She has a history of coronary artery disease, status post PCI of the right coronary artery. She underwent cardiac catheterization in September 2014 for severe AS. The right coronary was totally occluded but no other coronary disease noted. Preoperative carotid Dopplers in September of 2014 showed 1-39% bilateral stenosis. She was scheduled for surgery but had a sheath placed into her right carotid artery by mistake. This required surgical repair. Patient subsequently had aortic valve replacement with a pericardial tissue valve. She also had a Maze procedure performed. Postoperative echocardiogram showed normal LV function, pericardial tissue valve with no aortic insufficiency, mild left atrial enlargement, mild to moderate tricuspid regurgitation and a trivial pericardial effusion. Note patient had a chest CT in September of 2014 preoperatively that showed nodule in the right upper lobe. Followup recommended in 3 months. Since discharge her dyspnea is markedly improved. She denies syncope or pedal edema. She has residual chest soreness from her recent surgery but no other chest pain. No fevers.   Current Outpatient Prescriptions  Medication Sig Dispense Refill  . amiodarone (PACERONE) 200 MG tablet Take 1 tablet (200 mg total) by mouth 2 (two) times daily after a meal.  60 tablet  1  . cinacalcet (SENSIPAR) 60 MG tablet Take 60 mg by mouth every other day.      . dapsone 25 MG tablet Take 25 mg by mouth daily.       . diphenhydrAMINE (BENADRYL) 50 MG capsule Take 1 capsule (50 mg total) by mouth every 6 (six) hours as needed for itching.  90 capsule  3  . Flurandrenolide 4 MCG/SQCM TAPE Apply topically daily.      . furosemide (LASIX) 80 MG tablet Take 1 tablet (80 mg total) by mouth 2 (two) times daily.  60 tablet  1  . glipiZIDE (GLUCOTROL XL)  10 MG 24 hr tablet Take 1 tablet (10 mg total) by mouth daily.  30 tablet  11  . glucose blood test strip Use as instructed qid ac and hs  100 each  12  . insulin lispro (HUMALOG) 100 UNIT/ML injection Inject 8 Units into the skin 3 (three) times daily before meals. Use 8 units sq at hs if glucose is >150 (4th injection)  10 mL  12  . metoprolol tartrate (LOPRESSOR) 25 MG tablet Take 25 mg by mouth 2 (two) times daily.      . mycophenolate (MYFORTIC) 180 MG EC tablet Take 360 mg by mouth 2 (two) times daily.       Marland Kitchen oxyCODONE (OXY IR/ROXICODONE) 5 MG immediate release tablet Take 1-2 tablets (5-10 mg total) by mouth every 6 (six) hours as needed for pain (pain).  120 tablet  0  . pravastatin (PRAVACHOL) 40 MG tablet Take 40 mg by mouth daily. Take one tablet Mon, Wed, and Friday      . tacrolimus (PROGRAF) 1 MG capsule Take 5 mg by mouth 2 (two) times daily. Take 5 capsules in the am, and 5 capsules in the pm.      . triamcinolone cream (KENALOG) 0.1 % Apply 1 application topically 2 (two) times daily.       Marland Kitchen warfarin (COUMADIN) 4 MG tablet Take 5 mg by mouth daily. Take Coumadin 4 mg po every Monday Wednesday, Friday. Take Coumadin 2 mg every Tuesday, Thursday, Saturday, and Sunday.  No current facility-administered medications for this visit.     Past Medical History  Diagnosis Date  . Coronary artery disease 05/2002    a. Ant MI 2003 s/p PTCA/stent to RCA.   Marland Kitchen Hypertension   . Hyperlipidemia   . CHF (congestive heart failure)   . Pericardial effusion     a. Small by echo 11/2011.  Marland Kitchen GERD (gastroesophageal reflux disease)   . 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 Unspecified hearing loss   . Anemia, iron deficiency     of chronic disease  . Helicobacter pylori (H. pylori) infection     hx of  . Esophagitis, reflux   . Gout   . Cholelithiasis   . Hx of colonic polyps   . Diverticulosis of colon   . Streptococcal infection group D enterococcus      Recurrent Enterococcus bacteremia status post removal of infected graft on May 07, 2008, with removal of PermCath and subsequent replacement 06/2008.  Marland Kitchen Chronic cough onset 03/2010    Dr Melvyn Novas  . Carotid artery disease     a. Carotid Dopplers performed in August 2013 showed 40-59% left stenosis and 0-39% right; f/u recommended in 2 years.   . Aortic stenosis     a. Severe AS by echo 11/2012.  . Asystole     a. During ENT surgery 2005: developed marked asystole requiring CPR, felt due to vagal reaction (cath nonobst dz).  . Myocardial infarction 2003  . Cerebrovascular accident 2009    a. LMCA infarct felt embolic 123XX123, maintained on chronic coumadin.; denies residual on 04/05/2013  . Sleep apnea     Pt says testing was positive, intolerant of CPAP.  Marland Kitchen Type II diabetes mellitus   . ESRD (end stage renal disease)     a. Mass on L kidney per pt s/p nephrectomy - pt states not cancer - [facility] notes indicate ESRD due to HTN/DM - was previously on HD. b. Kidney transplant 02/2011.  . S/P kidney transplant 03/16/2011  . Chronic Persistent Atrial Fibrillation 12/31/2008    Qualifier: Diagnosis of  By: [name]    . Nodule of right lung 04/07/2013    Ground glass opacity right lung  . S/P aortic valve replacement with bioprosthetic valve and maze procedure 04/12/2013    69mm [facility] Ease bovine pericardial tissue valve   . S/P Maze operation for atrial fibrillation 04/12/2013    Complete bilateral atrial lesion set using bipolar radiofrequency and cryothermy ablation with clipping of LA appendage  . Pruritic dermatitis     treated with steriods/UV light    Past Surgical History  Procedure Laterality Date  . Cholecystectomy  2009  . Arteriovenous graft placement Left   . Av fistula placement Right   . Nasal reconstruction with septal repair      "took it out" (04/05/2013)  . Tubal ligation    . Nephrectomy Left 2010    no CA on bx  . Kidney transplant  03/16/11  . Cardioversion   05/29/2012    Procedure: CARDIOVERSION;  Surgeon: Lelon Perla, MD;  Location[facility] ENDOSCOPY;  Service: Cardiovascular;  Laterality: N/A;  . Coronary angioplasty with stent placement Right     coronary artery  . Tonsillectomy    . Total abdominal hysterectomy    . Arteriovenous graft placement Left     "I've had 2 on my left; had one removed" (04/05/2013)   . Av fistula repair Right     "  took it out" ((/18/2014)  . Insertion of dialysis catheter Bilateral     "over the years; took them both out" (04/05/2013)  . Intraoperative transesophageal echocardiogram N/A 04/11/2013    Procedure: INTRAOPERATIVE TRANSESOPHAGEAL ECHOCARDIOGRAM;  Surgeon: Rexene Alberts, MD;  Location: [place];  Service: Open Heart Surgery;  Laterality: N/A;  . Artery exploration Right 04/11/2013    Procedure: ARTERY EXPLORATION;  Surgeon: Rexene Alberts, MD;  Location: [place];  Service: Open Heart Surgery;  Laterality: Right;  Right carotid artery exploration  . Aortic valve replacement N/A 04/12/2013    Procedure: AORTIC VALVE REPLACEMENT (AVR);  Surgeon: Rexene Alberts, MD;  Location: [place];  Service: Open Heart Surgery;  Laterality: N/A;  . Maze N/A 04/12/2013    Procedure: MAZE;  Surgeon: Rexene Alberts, MD;  Location: [place];  Service: Open Heart Surgery;  Laterality: N/A;  . Intraoperative transesophageal echocardiogram N/A 04/12/2013    Procedure: INTRAOPERATIVE TRANSESOPHAGEAL ECHOCARDIOGRAM;  Surgeon: Rexene Alberts, MD;  Location: [place];  Service: Open Heart Surgery;  Laterality: N/A;    History   Social History  . Marital Status: Married    Spouse Name: N/A    Number of Children: N/A  . Years of Education: N/A   Occupational History  . retired    Social History Main Topics  . Smoking status: Former Smoker -- 1.00 packs/day for 30 years    Types: Cigarettes    Quit date: 07/19/2001  . Smokeless tobacco: Never Used  . Alcohol Use: No  . Drug Use: No  . Sexual Activity: Not Currently   Other Topics  Concern  . Not on file   Social History Narrative   Patient signed a Designated Party Release to allow her spouse [name] and family and five children to have access to her medical records/information.      ROS: no fevers or chills, productive cough, hemoptysis, dysphasia, odynophagia, melena, hematochezia, dysuria, hematuria, rash, seizure activity, orthopnea, PND, pedal edema, claudication. Remaining systems are negative.  Physical Exam: Well-developed well-nourished in no acute distress.  Skin is warm and dry.  HEENT is normal.  Neck is supple.  Chest is clear to auscultation with normal expansion. Previous sternotomy Cardiovascular exam is regular rate and rhythm. 2/6 systolic murmur left sternal border. No diastolic murmur. Abdominal exam nontender or distended. No masses palpated. Extremities show no edema. neuro grossly intact  ECG sinus rhythm at a rate of 66. Left ventricular hypertrophy with repolarization abnormality. Prolonged QT.

## 2013-05-28 ENCOUNTER — Encounter (HOSPITAL_COMMUNITY): Payer: Medicare Other

## 2013-05-30 ENCOUNTER — Encounter (HOSPITAL_COMMUNITY): Payer: Medicare Other

## 2013-05-30 ENCOUNTER — Ambulatory Visit (INDEPENDENT_AMBULATORY_CARE_PROVIDER_SITE_OTHER): Payer: Medicare Other | Admitting: Cardiovascular Disease

## 2013-05-30 DIAGNOSIS — I252 Old myocardial infarction: Secondary | ICD-10-CM | POA: Diagnosis not present

## 2013-05-30 DIAGNOSIS — Z7901 Long term (current) use of anticoagulants: Secondary | ICD-10-CM

## 2013-05-30 DIAGNOSIS — I4891 Unspecified atrial fibrillation: Secondary | ICD-10-CM

## 2013-05-30 DIAGNOSIS — Z48812 Encounter for surgical aftercare following surgery on the circulatory system: Secondary | ICD-10-CM | POA: Diagnosis not present

## 2013-05-30 DIAGNOSIS — I2510 Atherosclerotic heart disease of native coronary artery without angina pectoris: Secondary | ICD-10-CM | POA: Diagnosis not present

## 2013-05-30 DIAGNOSIS — E1129 Type 2 diabetes mellitus with other diabetic kidney complication: Secondary | ICD-10-CM | POA: Diagnosis not present

## 2013-05-30 DIAGNOSIS — N186 End stage renal disease: Secondary | ICD-10-CM | POA: Diagnosis not present

## 2013-05-30 LAB — POCT INR: INR: 2.2

## 2013-05-31 ENCOUNTER — Encounter (HOSPITAL_COMMUNITY)
Admission: RE | Admit: 2013-05-31 | Discharge: 2013-05-31 | Disposition: A | Discharge status: Home / Self Care | Payer: Medicare Other | Source: Ambulatory Visit | Attending: Cardiology | Admitting: Cardiology

## 2013-05-31 DIAGNOSIS — I4891 Unspecified atrial fibrillation: Secondary | ICD-10-CM | POA: Insufficient documentation

## 2013-05-31 DIAGNOSIS — I252 Old myocardial infarction: Secondary | ICD-10-CM | POA: Insufficient documentation

## 2013-05-31 DIAGNOSIS — Z5189 Encounter for other specified aftercare: Secondary | ICD-10-CM | POA: Insufficient documentation

## 2013-05-31 DIAGNOSIS — I5032 Chronic diastolic (congestive) heart failure: Secondary | ICD-10-CM | POA: Insufficient documentation

## 2013-05-31 DIAGNOSIS — E1169 Type 2 diabetes mellitus with other specified complication: Secondary | ICD-10-CM | POA: Insufficient documentation

## 2013-05-31 DIAGNOSIS — Z952 Presence of prosthetic heart valve: Secondary | ICD-10-CM | POA: Insufficient documentation

## 2013-05-31 DIAGNOSIS — I2582 Chronic total occlusion of coronary artery: Secondary | ICD-10-CM | POA: Insufficient documentation

## 2013-05-31 NOTE — Progress Notes (Signed)
Cardiac Rehab Medication Review by a Pharmacist  Does the patient  feel that his/her medications are working for him/her?  yes  Has the patient been experiencing any side effects to the medications prescribed?  Yes - excess saliva  Does the patient measure his/her own blood pressure or blood glucose at home?  yes   Does the patient have any problems obtaining medications due to transportation or finances?   Yes - has applied for grants for Prograf and Sensipar  Understanding of regimen: good Understanding of indications: good Potential of compliance: good   Pharmacist comments: Patient had a good understanding of her medications. She reported having excess saliva from her medications and has to spit all the time. She has applied for grants to pay for her medications. She told me that she did not know medications could cost that much. Her warfarin dose has been changed recently so I updated with the new dosing regimen.    Roderic Palau A. Pincus Badder, PharmD Clinical Pharmacist - Resident Pager: [phone] Pharmacy: [phone] 05/31/2013 9:00 AM

## 2013-06-01 ENCOUNTER — Encounter (HOSPITAL_COMMUNITY): Payer: Medicare Other

## 2013-06-04 ENCOUNTER — Encounter (HOSPITAL_COMMUNITY)
Admission: RE | Admit: 2013-06-04 | Discharge: 2013-06-04 | Disposition: A | Discharge status: Home / Self Care | Payer: Medicare Other | Source: Ambulatory Visit | Attending: Cardiology | Admitting: Cardiology

## 2013-06-04 DIAGNOSIS — I2582 Chronic total occlusion of coronary artery: Secondary | ICD-10-CM | POA: Diagnosis not present

## 2013-06-04 DIAGNOSIS — Z952 Presence of prosthetic heart valve: Secondary | ICD-10-CM | POA: Diagnosis not present

## 2013-06-04 DIAGNOSIS — E1169 Type 2 diabetes mellitus with other specified complication: Secondary | ICD-10-CM | POA: Diagnosis not present

## 2013-06-04 DIAGNOSIS — Z5189 Encounter for other specified aftercare: Secondary | ICD-10-CM | POA: Diagnosis not present

## 2013-06-04 DIAGNOSIS — I5032 Chronic diastolic (congestive) heart failure: Secondary | ICD-10-CM | POA: Diagnosis not present

## 2013-06-04 DIAGNOSIS — I4891 Unspecified atrial fibrillation: Secondary | ICD-10-CM | POA: Diagnosis not present

## 2013-06-04 DIAGNOSIS — I252 Old myocardial infarction: Secondary | ICD-10-CM | POA: Diagnosis not present

## 2013-06-04 LAB — GLUCOSE, CAPILLARY: Glucose-Capillary: 188 mg/dL — ABNORMAL HIGH (ref 70–99)

## 2013-06-04 NOTE — Progress Notes (Signed)
Pt started cardiac rehab today.  Pt tolerated light exercise without difficulty. Telemetry rhythm Sinus. Vital signs stable. Mrs [name]  Uses a rollator for stability. Patient encouraged to take rest breaks due to deconditioning. Will continue to monitor the patient throughout  the program.

## 2013-06-06 ENCOUNTER — Ambulatory Visit (INDEPENDENT_AMBULATORY_CARE_PROVIDER_SITE_OTHER): Payer: Medicare Other | Admitting: *Deleted

## 2013-06-06 ENCOUNTER — Encounter (HOSPITAL_COMMUNITY)
Admission: RE | Admit: 2013-06-06 | Discharge: 2013-06-06 | Disposition: A | Discharge status: Home / Self Care | Payer: Medicare Other | Source: Ambulatory Visit | Attending: Cardiology | Admitting: Cardiology

## 2013-06-06 DIAGNOSIS — I4891 Unspecified atrial fibrillation: Secondary | ICD-10-CM

## 2013-06-06 DIAGNOSIS — Z7901 Long term (current) use of anticoagulants: Secondary | ICD-10-CM

## 2013-06-07 DIAGNOSIS — Z940 Kidney transplant status: Secondary | ICD-10-CM | POA: Diagnosis not present

## 2013-06-08 ENCOUNTER — Encounter: Payer: Self-pay | Admitting: Internal Medicine

## 2013-06-08 ENCOUNTER — Ambulatory Visit (INDEPENDENT_AMBULATORY_CARE_PROVIDER_SITE_OTHER): Payer: Medicare Other | Admitting: Internal Medicine

## 2013-06-08 ENCOUNTER — Encounter (HOSPITAL_COMMUNITY)
Admission: RE | Admit: 2013-06-08 | Discharge: 2013-06-08 | Disposition: A | Discharge status: Home / Self Care | Payer: Medicare Other | Source: Ambulatory Visit | Attending: Cardiology | Admitting: Cardiology

## 2013-06-08 VITALS — BP 106/52 | HR 64 | Temp 99.2°F | Resp 16 | Wt 174.0 lb

## 2013-06-08 DIAGNOSIS — M545 Low back pain, unspecified: Secondary | ICD-10-CM

## 2013-06-08 DIAGNOSIS — I10 Essential (primary) hypertension: Secondary | ICD-10-CM

## 2013-06-08 DIAGNOSIS — I509 Heart failure, unspecified: Secondary | ICD-10-CM | POA: Diagnosis not present

## 2013-06-08 DIAGNOSIS — L299 Pruritus, unspecified: Secondary | ICD-10-CM

## 2013-06-08 DIAGNOSIS — R5381 Other malaise: Secondary | ICD-10-CM

## 2013-06-08 DIAGNOSIS — E119 Type 2 diabetes mellitus without complications: Secondary | ICD-10-CM

## 2013-06-08 LAB — GLUCOSE, CAPILLARY
Glucose-Capillary: 248 mg/dL — ABNORMAL HIGH (ref 70–99)
Glucose-Capillary: 134 mg/dL — ABNORMAL HIGH (ref 70–99)

## 2013-06-08 MED ORDER — INSULIN ASPART 100 UNIT/ML FLEXPEN: PEN_INJECTOR | SUBCUTANEOUS | Status: DC

## 2013-06-08 MED ORDER — TRIAMCINOLONE ACETONIDE 0.1 % EX CREA: 1.0000 "application " | TOPICAL_CREAM | Freq: Two times a day (BID) | CUTANEOUS | Status: DC

## 2013-06-08 MED ORDER — OXYCODONE HCL 5 MG PO TABS: 5.0000 mg | ORAL_TABLET | Freq: Four times a day (QID) | ORAL | Status: DC | PRN

## 2013-06-08 NOTE — Assessment & Plan Note (Signed)
Kenalog prn

## 2013-06-08 NOTE — Assessment & Plan Note (Addendum)
We are trying to figure out what is covered Humalog was $80 Pt will find out what are affordable options

## 2013-06-08 NOTE — Assessment & Plan Note (Signed)
Continue with current prescription therapy as reflected on the Med list.  

## 2013-06-08 NOTE — Progress Notes (Signed)
Pre visit review using our clinic review tool, if applicable. No additional management support is needed unless otherwise documented below in the visit note. 

## 2013-06-08 NOTE — Assessment & Plan Note (Signed)
Continue with current prescription prn therapy as reflected on the Med list.  

## 2013-06-09 ENCOUNTER — Encounter: Payer: Self-pay | Admitting: Internal Medicine

## 2013-06-09 NOTE — Assessment & Plan Note (Signed)
A little better 

## 2013-06-09 NOTE — Progress Notes (Signed)
Subjective:     [name] is a 67 y/o F with history of CAD s/p MI/RCA stent 2005, aortic stenosis, embolic CVA 123XX123, PAF on Coumadin, DM, pericardial effusion, and CKD (ESRD due to HTN/DM -> HD then renal transplant 2012 followed at [facility]) .  Pt with severe aortic stenosis now s/p AVR on 04/11/13.  F/u elev CBG: 200s ac and 300s pc: using Insulin tid - better CBGs. No low CBGs  Novolog pen is not covered (?), Humalog is not covered   Diabetes Hypoglycemia symptoms include nervousness/anxiousness. Pertinent negatives for hypoglycemia include no confusion, dizziness, headaches, pallor or tremors. Associated symptoms include fatigue. Pertinent negatives for diabetes include no weakness.  Rash This is a chronic problem. The current episode started more than 1 year ago. The problem has been gradually worsening since onset. The affected locations include the chest, right wrist, right arm, left wrist, right lower leg and right upper leg. She was exposed to nothing. Associated symptoms include fatigue. Pertinent negatives include no congestion or cough. Past treatments include anti-itch cream and topical steroids. The treatment provided mild relief. Her past medical history is significant for eczema.    The patient presents for a follow-up of  Chronic ESRD, hypertension, chronic dyslipidemia, type 2 diabetes not controlled with medicines. She just had a renal tranplant in 8/12. F/u itching and rash - not better.    BP Readings from Last 3 Encounters:  06/08/13 106/52  05/31/13 140/80  05/25/13 140/70   Wt Readings from Last 3 Encounters:  06/08/13 174 lb (78.926 kg)  05/31/13 178 lb 9.2 oz (81 kg)  05/25/13 174 lb (78.926 kg)     Review of Systems  Constitutional: Positive for fatigue. Negative for chills, activity change, appetite change and unexpected weight change.  HENT: Negative for congestion, mouth sores and sinus pressure.   Eyes: Negative for visual disturbance.  Respiratory:  Negative for cough and chest tightness.   Gastrointestinal: Negative for nausea and abdominal pain.  Genitourinary: Negative for frequency, difficulty urinating and vaginal pain.  Musculoskeletal: Positive for arthralgias and gait problem. Negative for back pain.  Skin: Negative for pallor and rash.       pruritis  Neurological: Negative for dizziness, tremors, weakness, numbness and headaches.  Psychiatric/Behavioral: Negative for confusion and sleep disturbance. The patient is nervous/anxious.        Objective:   Physical Exam  Constitutional: No distress.  Looks much better  HENT:  Head: Normocephalic.  Right Ear: External ear normal.  Left Ear: External ear normal.  Nose: Nose normal.  Mouth/Throat: Oropharynx is clear and moist.  Eyes: Conjunctivae are normal. Pupils are equal, round, and reactive to light. Right eye exhibits no discharge. Left eye exhibits no discharge.  Neck: Normal range of motion. Neck supple. No JVD present. No tracheal deviation present. No thyromegaly present.  Cardiovascular: Normal rate and regular rhythm.  Exam reveals no gallop and no friction rub.   Murmur (2/6) heard. Pulmonary/Chest: No stridor. No respiratory distress. She has no wheezes.  Abdominal: Soft. Bowel sounds are normal. She exhibits no distension and no mass. There is no tenderness. There is no rebound and no guarding.  Musculoskeletal: She exhibits no edema and no tenderness.  Walking w/a cane  Lymphadenopathy:    She has no cervical adenopathy.  Neurological: She displays normal reflexes. No cranial nerve deficit. She exhibits normal muscle tone. Coordination normal.  Skin: No rash (hives and hyperpigmentation on LE and UEs B) noted. No erythema.  Scar  on chest and   Psychiatric: She has a normal mood and affect. Her behavior is normal. Thought content normal.   Graft in L arm seems to be ok  Lab Results  Component Value Date   WBC 13.9* 04/22/2013   HGB 9.6* 04/22/2013   HCT  26.2* 04/22/2013   PLT 316 04/22/2013   GLUCOSE 161* 04/22/2013   CHOL 164 04/06/2013   TRIG 125 04/06/2013   HDL 73 04/06/2013   LDLDIRECT 89.3 06/13/2006   LDLCALC 66 04/06/2013   ALT 97* 04/15/2013   AST 72* 04/15/2013   NA 135 04/22/2013   K 3.5 04/22/2013   CL 93* 04/22/2013   CREATININE 2.51* 04/22/2013   BUN 54* 04/22/2013   CO2 26 04/22/2013   TSH 1.76 05/25/2012   INR 2.5 06/06/2013   HGBA1C 6.9* 04/07/2013   MICROALBUR 168.6* 10/31/2006       Assessment & Plan:

## 2013-06-11 ENCOUNTER — Encounter (HOSPITAL_COMMUNITY): Admission: RE | Admit: 2013-06-11 | Payer: Medicare Other | Source: Ambulatory Visit

## 2013-06-13 ENCOUNTER — Encounter (HOSPITAL_COMMUNITY)
Admission: RE | Admit: 2013-06-13 | Discharge: 2013-06-13 | Disposition: A | Discharge status: Home / Self Care | Payer: Medicare Other | Source: Ambulatory Visit | Attending: Cardiology | Admitting: Cardiology

## 2013-06-13 ENCOUNTER — Telehealth: Payer: Self-pay | Admitting: *Deleted

## 2013-06-13 LAB — GLUCOSE, CAPILLARY
Glucose-Capillary: 289 mg/dL — ABNORMAL HIGH (ref 70–99)
Glucose-Capillary: 331 mg/dL — ABNORMAL HIGH (ref 70–99)

## 2013-06-13 NOTE — Telephone Encounter (Signed)
[name] from Cardiac Rehab called states pts BS was 331 after exercise. pts usually are not able to exercise if the BS is 300 however her was 289 before exercise.  Pt has Insulin in the car to take.  Please advise in Dr Plotnikovs absence.

## 2013-06-13 NOTE — Telephone Encounter (Signed)
Use her insulin as directed

## 2013-06-13 NOTE — Progress Notes (Signed)
Per exercise CBG 289.  June exercised [name] without complaints.  Post exercise CBG 331. Urine negative for ketones. [name] said she ate a half of a tootsie roll prior to coming to exercise. Dr Plotnikov's office called and notifed, left message with triage. I encouraged [name] to drink water and to be careful about eating sweets. Patient and husband states understanding. Will fax exercise flow sheets to Dr. Judeen Hammans office for review. Will continue to monitor the patient throughout  the program.

## 2013-06-13 NOTE — Telephone Encounter (Signed)
Spoke with Verdis Frederickson advised of MDs message.

## 2013-06-15 ENCOUNTER — Encounter (HOSPITAL_COMMUNITY): Payer: Medicare Other

## 2013-06-15 ENCOUNTER — Other Ambulatory Visit: Payer: Self-pay | Admitting: Cardiology

## 2013-06-18 ENCOUNTER — Encounter (HOSPITAL_COMMUNITY)
Admission: RE | Admit: 2013-06-18 | Discharge: 2013-06-18 | Disposition: A | Discharge status: Home / Self Care | Payer: Medicare Other | Source: Ambulatory Visit | Attending: Cardiology | Admitting: Cardiology

## 2013-06-18 DIAGNOSIS — Z952 Presence of prosthetic heart valve: Secondary | ICD-10-CM | POA: Diagnosis not present

## 2013-06-18 DIAGNOSIS — Z940 Kidney transplant status: Secondary | ICD-10-CM | POA: Diagnosis not present

## 2013-06-18 DIAGNOSIS — I5032 Chronic diastolic (congestive) heart failure: Secondary | ICD-10-CM | POA: Diagnosis not present

## 2013-06-18 DIAGNOSIS — I2582 Chronic total occlusion of coronary artery: Secondary | ICD-10-CM | POA: Diagnosis not present

## 2013-06-18 DIAGNOSIS — Z5189 Encounter for other specified aftercare: Secondary | ICD-10-CM | POA: Diagnosis not present

## 2013-06-18 DIAGNOSIS — E1169 Type 2 diabetes mellitus with other specified complication: Secondary | ICD-10-CM | POA: Insufficient documentation

## 2013-06-18 DIAGNOSIS — N2581 Secondary hyperparathyroidism of renal origin: Secondary | ICD-10-CM | POA: Diagnosis not present

## 2013-06-18 DIAGNOSIS — I252 Old myocardial infarction: Secondary | ICD-10-CM | POA: Insufficient documentation

## 2013-06-18 DIAGNOSIS — I4891 Unspecified atrial fibrillation: Secondary | ICD-10-CM | POA: Insufficient documentation

## 2013-06-18 DIAGNOSIS — I129 Hypertensive chronic kidney disease with stage 1 through stage 4 chronic kidney disease, or unspecified chronic kidney disease: Secondary | ICD-10-CM | POA: Diagnosis not present

## 2013-06-18 DIAGNOSIS — N184 Chronic kidney disease, stage 4 (severe): Secondary | ICD-10-CM | POA: Diagnosis not present

## 2013-06-18 LAB — GLUCOSE, CAPILLARY: Glucose-Capillary: 222 mg/dL — ABNORMAL HIGH (ref 70–99)

## 2013-06-18 NOTE — Progress Notes (Signed)
Reviewed home exercise with pt today.  Pt plans to walk at home use floor pedaler at home for exercise.  Reviewed THR, pulse, RPE, sign and symptoms, NTG use, and when to call 911 or MD.  Pt voiced understanding. Alberteen Sam, MA, ACSM RCEP

## 2013-06-20 ENCOUNTER — Encounter (HOSPITAL_COMMUNITY)
Admission: RE | Admit: 2013-06-20 | Discharge: 2013-06-20 | Disposition: A | Discharge status: Home / Self Care | Payer: Medicare Other | Source: Ambulatory Visit | Attending: Cardiology | Admitting: Cardiology

## 2013-06-20 LAB — GLUCOSE, CAPILLARY: Glucose-Capillary: 276 mg/dL — ABNORMAL HIGH (ref 70–99)

## 2013-06-20 NOTE — Progress Notes (Signed)
[name] weight is up 1.6kg from [employer] exercise session. [name] has been non complaint with her diet. [name] had lamb chops for dinner and [name] with neck bones. [name] said she drank some sweet tea at lunch today. Oxygen saturation 96% on room air. Lung fields clear upon auscultation. Advised the patient to watch her salt intake. [name] admits to adding a little salt at the table. Will ask  our nutritionist to consult with the patient. Will notify Dr [name] and Dr Alain Marion of weight gain.

## 2013-06-21 ENCOUNTER — Telehealth: Payer: Self-pay | Admitting: *Deleted

## 2013-06-21 ENCOUNTER — Ambulatory Visit (INDEPENDENT_AMBULATORY_CARE_PROVIDER_SITE_OTHER): Payer: Medicare Other | Admitting: General Practice

## 2013-06-21 DIAGNOSIS — I4891 Unspecified atrial fibrillation: Secondary | ICD-10-CM | POA: Diagnosis not present

## 2013-06-21 DIAGNOSIS — Z7901 Long term (current) use of anticoagulants: Secondary | ICD-10-CM | POA: Diagnosis not present

## 2013-06-21 LAB — POCT INR: INR: 3.8

## 2013-06-21 NOTE — Telephone Encounter (Signed)
Spoke with pt, she states she is still taking the samples provided by [name].  She has not had to turn in Rx.

## 2013-06-21 NOTE — Telephone Encounter (Signed)
Noted. Did they figure out what insulin is more affordable? Thx

## 2013-06-21 NOTE — Telephone Encounter (Signed)
[name] called states pt is non compliant with diet which has caused weight gain.  Also reports pts blood sugar is running high.

## 2013-06-22 ENCOUNTER — Encounter (HOSPITAL_COMMUNITY): Payer: Medicare Other

## 2013-06-25 ENCOUNTER — Encounter (HOSPITAL_COMMUNITY)
Admission: RE | Admit: 2013-06-25 | Discharge: 2013-06-25 | Disposition: A | Discharge status: Home / Self Care | Payer: Medicare Other | Source: Ambulatory Visit | Attending: Cardiology | Admitting: Cardiology

## 2013-06-27 ENCOUNTER — Encounter (HOSPITAL_COMMUNITY): Payer: Medicare Other

## 2013-06-27 ENCOUNTER — Other Ambulatory Visit: Payer: Self-pay

## 2013-06-27 MED ORDER — ONETOUCH DELICA LANCETS FINE MISC: Status: DC

## 2013-06-27 MED ORDER — INSULIN PEN NEEDLE 31G X 5 MM MISC: Status: DC

## 2013-06-29 ENCOUNTER — Encounter (HOSPITAL_COMMUNITY)
Admission: RE | Admit: 2013-06-29 | Discharge: 2013-06-29 | Disposition: A | Discharge status: Home / Self Care | Payer: Medicare Other | Source: Ambulatory Visit | Attending: Cardiology | Admitting: Cardiology

## 2013-06-29 LAB — GLUCOSE, CAPILLARY: Glucose-Capillary: 249 mg/dL — ABNORMAL HIGH (ref 70–99)

## 2013-07-02 ENCOUNTER — Encounter (HOSPITAL_COMMUNITY)
Admission: RE | Admit: 2013-07-02 | Discharge: 2013-07-02 | Disposition: A | Discharge status: Home / Self Care | Payer: Medicare Other | Source: Ambulatory Visit | Attending: Cardiology | Admitting: Cardiology

## 2013-07-02 LAB — GLUCOSE, CAPILLARY: Glucose-Capillary: 225 mg/dL — ABNORMAL HIGH (ref 70–99)

## 2013-07-02 NOTE — Progress Notes (Addendum)
[name] 67 y.o. female Nutrition Note Spoke with pt. Nutrition Plan and Nutrition Survey goals reviewed with pt. Pt is following Step 1 of the Therapeutic Lifestyle Changes diet. Pt wants to lose wt. Pt has been trying to lose wt by "following a 1200 calorie diet.". Wt loss tips reviewed. Per discussion, pt's husband is the primary cook for the family and "he likes to use salted pork" when cooking. Pt states she asks her spouse to not cook with salted pork or use less. Pt admits she has difficulty with asking too much of her husband "because he does everything for [place] already (e.g. Cook, clean, etc). Pt is diabetic. Last A1c indicates blood glucose well-controlled. Pt c/o difficulty with CBG's "being high" since hospitalization. Pt states she was placed on insulin during her last hospitalization. This Probation officer went over Diabetes Education test results. Pt expressed understanding of the information reviewed. Pt aware of nutrition education classes offered and is unable to attend nutrition classes. Per discussion, pt is educated re: DM and what she "should do" and pt does not always eat what she knows is healthier for her.  Lab Results  Component Value Date   HGBA1C 6.9* 04/07/2013   Nutrition Diagnosis   Food-and nutrition-related knowledge deficit related to lack of exposure to information as related to diagnosis of: ? CVD ? DM    Obesity related to excessive energy intake as evidenced by a BMI of 33.2  Nutrition RX/ Estimated Daily Nutrition Needs for: wt loss  1200-1400 Kcal, 30-40 gm fat, 9-11 gm sat fat, 1.1-1.4 gm trans-fat, <1500 mg sodium, 150-175 gm CHO   Nutrition Intervention   Pt's individual nutrition plan reviewed with pt.   Benefits of adopting Therapeutic Lifestyle Changes discussed when Medficts reviewed.   Pt to attend the Portion Distortion class and the Diabetes Q & A class   Pt given handouts for: ? Nutrition I class ? Nutrition II class ? Diabetes Blitz class   Continue  client-centered nutrition education by RD, as part of interdisciplinary care. Goal(s)   Pt to identify and limit food sources of saturated fat, trans fat, and cholesterol   Pt to identify food quantities necessary to achieve: ? wt loss to a goal wt of 154-172 lb (70.1-78.3 kg) at graduation from cardiac rehab.    CBG concentrations in the normal range or as close to normal as is safely possible. Monitor and Evaluate progress toward nutrition goal with team. Nutrition Risk: Change to Moderate Derek Mound, M.Ed, RD, LDN, CDE 07/02/2013 3:46 PM

## 2013-07-04 ENCOUNTER — Encounter (HOSPITAL_COMMUNITY): Payer: Medicare Other

## 2013-07-05 ENCOUNTER — Ambulatory Visit (INDEPENDENT_AMBULATORY_CARE_PROVIDER_SITE_OTHER): Payer: Medicare Other | Admitting: *Deleted

## 2013-07-05 DIAGNOSIS — Z7901 Long term (current) use of anticoagulants: Secondary | ICD-10-CM

## 2013-07-05 DIAGNOSIS — I4891 Unspecified atrial fibrillation: Secondary | ICD-10-CM | POA: Diagnosis not present

## 2013-07-06 ENCOUNTER — Encounter (HOSPITAL_COMMUNITY): Payer: Medicare Other

## 2013-07-08 ENCOUNTER — Other Ambulatory Visit: Payer: Self-pay | Admitting: Physician Assistant

## 2013-07-09 ENCOUNTER — Encounter (HOSPITAL_COMMUNITY): Payer: Medicare Other

## 2013-07-10 ENCOUNTER — Encounter: Payer: Self-pay | Admitting: Internal Medicine

## 2013-07-10 ENCOUNTER — Ambulatory Visit (INDEPENDENT_AMBULATORY_CARE_PROVIDER_SITE_OTHER): Payer: Medicare Other | Admitting: Internal Medicine

## 2013-07-10 ENCOUNTER — Ambulatory Visit (INDEPENDENT_AMBULATORY_CARE_PROVIDER_SITE_OTHER): Payer: Medicare Other | Admitting: *Deleted

## 2013-07-10 VITALS — BP 150/60 | HR 72 | Temp 99.0°F | Resp 16 | Wt 187.0 lb

## 2013-07-10 DIAGNOSIS — N186 End stage renal disease: Secondary | ICD-10-CM

## 2013-07-10 DIAGNOSIS — I2510 Atherosclerotic heart disease of native coronary artery without angina pectoris: Secondary | ICD-10-CM | POA: Diagnosis not present

## 2013-07-10 DIAGNOSIS — E119 Type 2 diabetes mellitus without complications: Secondary | ICD-10-CM

## 2013-07-10 DIAGNOSIS — N19 Unspecified kidney failure: Secondary | ICD-10-CM | POA: Diagnosis not present

## 2013-07-10 DIAGNOSIS — I10 Essential (primary) hypertension: Secondary | ICD-10-CM | POA: Diagnosis not present

## 2013-07-10 DIAGNOSIS — M545 Low back pain: Secondary | ICD-10-CM

## 2013-07-10 DIAGNOSIS — R5381 Other malaise: Secondary | ICD-10-CM

## 2013-07-10 LAB — BASIC METABOLIC PANEL
CO2: 26 mEq/L (ref 19–32)
Calcium: 9.4 mg/dL (ref 8.4–10.5)
Chloride: 105 mEq/L (ref 96–112)
Creatinine, Ser: 1.8 mg/dL — ABNORMAL HIGH (ref 0.4–1.2)
GFR: 35.15 mL/min — ABNORMAL LOW (ref >60.00)
Potassium: 4.7 mEq/L (ref 3.5–5.1)
Sodium: 138 mEq/L (ref 135–145)

## 2013-07-10 LAB — HEPATIC FUNCTION PANEL
AST: 30 U/L (ref 0–37)
Total Bilirubin: 0.6 mg/dL (ref 0.3–1.2)
Total Protein: 7.3 g/dL (ref 6.0–8.3)

## 2013-07-10 LAB — TSH: TSH: 13.22 u[IU]/mL — ABNORMAL HIGH (ref 0.35–5.50)

## 2013-07-10 MED ORDER — INSULIN LISPRO 100 UNIT/ML (KWIKPEN): 8.0000 [IU] | PEN_INJECTOR | Freq: Three times a day (TID) | SUBCUTANEOUS | Status: DC

## 2013-07-10 MED ORDER — FUROSEMIDE 80 MG PO TABS: 80.0000 mg | ORAL_TABLET | Freq: Two times a day (BID) | ORAL | Status: DC

## 2013-07-10 MED ORDER — INSULIN LISPRO 100 UNIT/ML (KWIKPEN): 4.0000 [IU] | PEN_INJECTOR | Freq: Three times a day (TID) | SUBCUTANEOUS | Status: DC

## 2013-07-10 NOTE — Assessment & Plan Note (Signed)
Not on HD since 2012

## 2013-07-10 NOTE — Progress Notes (Signed)
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  Pt with severe aortic stenosis now s/p AVR on 04/11/13.  F/u elev CBG: 200s ac and 300s pc: using Insulin tid - better CBGs. No low CBGs  Novolog pen is not covered, Humalog is covered   Diabetes Hypoglycemia symptoms include nervousness/anxiousness. Pertinent negatives for hypoglycemia include no confusion, dizziness, headaches, pallor or tremors. Associated symptoms include fatigue. Pertinent negatives for diabetes include no weakness.  Rash This is a chronic problem. The current episode started more than 1 year ago. The problem has been gradually worsening since onset. The affected locations include the chest, right wrist, right arm, left wrist, right lower leg and right upper leg. She was exposed to nothing. Associated symptoms include fatigue. Pertinent negatives include no congestion or cough. Past treatments include anti-itch cream and topical steroids. The treatment provided mild relief. Her past medical history is significant for eczema.    The patient presents for a follow-up of  Chronic ESRD, hypertension, chronic dyslipidemia, type 2 diabetes not controlled with medicines. She just had a renal tranplant in 8/12. F/u itching and rash - not better.    BP Readings from Last 3 Encounters:  07/10/13 150/60  06/08/13 106/52  05/31/13 140/80   Wt Readings from Last 3 Encounters:  07/10/13 187 lb (84.823 kg)  06/08/13 174 lb (78.926 kg)  05/31/13 178 lb 9.2 oz (81 kg)     Review of Systems  Constitutional: Positive for fatigue. Negative for chills, activity change, appetite change and unexpected weight change.  HENT: Negative for congestion, mouth sores and sinus pressure.   Eyes: Negative for visual disturbance.  Respiratory: Negative  for cough and chest tightness.   Gastrointestinal: Negative for nausea and abdominal pain.  Genitourinary: Negative for frequency, difficulty urinating and vaginal pain.  Musculoskeletal: Positive for arthralgias and gait problem. Negative for back pain.  Skin: Negative for pallor and rash.       pruritis  Neurological: Negative for dizziness, tremors, weakness, numbness and headaches.  Psychiatric/Behavioral: Negative for confusion and sleep disturbance. The patient is nervous/anxious.        Objective:   Physical Exam  Constitutional: No distress.  Looks much better  HENT:  Head: Normocephalic.  Right Ear: External ear normal.  Left Ear: External ear normal.  Nose: Nose normal.  Mouth/Throat: Oropharynx is clear and moist.  Eyes: Conjunctivae are normal. Pupils are equal, round, and reactive to light. Right eye exhibits no discharge. Left eye exhibits no discharge.  Neck: Normal range of motion. Neck supple. No JVD present. No tracheal deviation present. No thyromegaly present.  Cardiovascular: Normal rate and regular rhythm.  Exam reveals no gallop and no friction rub.   Murmur (2/6) heard. Pulmonary/Chest: No stridor. No respiratory distress. She has no wheezes.  Abdominal: Soft. Bowel sounds are normal. She exhibits no distension and no mass. There is no tenderness. There is no rebound and no guarding.  Musculoskeletal: She exhibits no edema and no tenderness.  Walking w/a cane  Lymphadenopathy:    She has no cervical adenopathy.  Neurological: She displays normal reflexes. No cranial nerve deficit. She exhibits normal muscle tone. Coordination normal.  Skin: No rash (hives and hyperpigmentation on LE and UEs B) noted. No erythema.  Scar on chest  and   Psychiatric: She has a normal mood and affect. Her behavior is normal. Thought content normal.   Graft in L arm seems to be ok  Lab Results  Component Value Date   WBC 13.9* 04/22/2013   HGB 9.6* 04/22/2013   HCT 26.2*  04/22/2013   PLT 316 04/22/2013   GLUCOSE 161* 04/22/2013   CHOL 164 04/06/2013   TRIG 125 04/06/2013   HDL 73 04/06/2013   LDLDIRECT 89.3 06/13/2006   LDLCALC 66 04/06/2013   ALT 97* 04/15/2013   AST 72* 04/15/2013   NA 135 04/22/2013   K 3.5 04/22/2013   CL 93* 04/22/2013   CREATININE 2.51* 04/22/2013   BUN 54* 04/22/2013   CO2 26 04/22/2013   TSH 1.76 05/25/2012   INR 2.4 07/05/2013   HGBA1C 6.9* 04/07/2013   MICROALBUR 168.6* 10/31/2006       Assessment & Plan:

## 2013-07-10 NOTE — Assessment & Plan Note (Signed)
Continue with current prescription therapy as reflected on the Med list. BP is nl at home

## 2013-07-10 NOTE — Progress Notes (Signed)
Pre visit review using our clinic review tool, if applicable. No additional management support is needed unless otherwise documented below in the visit note. 

## 2013-07-10 NOTE — Assessment & Plan Note (Signed)
Much better  8 u Humalog ac and hs qid

## 2013-07-10 NOTE — Assessment & Plan Note (Signed)
On HD 

## 2013-07-11 ENCOUNTER — Encounter: Payer: Self-pay | Admitting: Internal Medicine

## 2013-07-11 ENCOUNTER — Encounter (HOSPITAL_COMMUNITY): Payer: Medicare Other

## 2013-07-11 NOTE — Assessment & Plan Note (Signed)
Continue with current prescription therapy as reflected on the Med list.  

## 2013-07-11 NOTE — Assessment & Plan Note (Signed)
Better  

## 2013-07-13 ENCOUNTER — Encounter (HOSPITAL_COMMUNITY): Payer: Medicare Other

## 2013-07-13 LAB — HEMOGLOBIN A1C: Hgb A1c MFr Bld: 8.3 % — ABNORMAL HIGH (ref 4.6–6.5)

## 2013-07-13 MED ORDER — LEVOTHYROXINE SODIUM 25 MCG PO TABS: 25.0000 ug | ORAL_TABLET | Freq: Every day | ORAL | Status: DC

## 2013-07-14 ENCOUNTER — Other Ambulatory Visit: Payer: Self-pay | Admitting: Physician Assistant

## 2013-07-16 ENCOUNTER — Encounter (HOSPITAL_COMMUNITY): Payer: Medicare Other

## 2013-07-16 ENCOUNTER — Other Ambulatory Visit: Payer: Self-pay | Admitting: Cardiology

## 2013-07-18 ENCOUNTER — Encounter (HOSPITAL_COMMUNITY): Payer: Medicare Other

## 2013-07-20 ENCOUNTER — Encounter (HOSPITAL_COMMUNITY): Payer: Medicare Other

## 2013-07-23 ENCOUNTER — Telehealth (HOSPITAL_COMMUNITY): Payer: Self-pay | Admitting: *Deleted

## 2013-07-23 ENCOUNTER — Encounter (HOSPITAL_COMMUNITY): Payer: Medicare Other

## 2013-07-25 ENCOUNTER — Encounter (HOSPITAL_COMMUNITY): Payer: Medicare Other

## 2013-07-26 ENCOUNTER — Ambulatory Visit (INDEPENDENT_AMBULATORY_CARE_PROVIDER_SITE_OTHER): Payer: Medicare Other

## 2013-07-26 DIAGNOSIS — Z5181 Encounter for therapeutic drug level monitoring: Secondary | ICD-10-CM

## 2013-07-26 DIAGNOSIS — Z7901 Long term (current) use of anticoagulants: Secondary | ICD-10-CM | POA: Diagnosis not present

## 2013-07-26 DIAGNOSIS — I4891 Unspecified atrial fibrillation: Secondary | ICD-10-CM | POA: Diagnosis not present

## 2013-07-26 LAB — POCT INR: INR: 3.8

## 2013-07-27 ENCOUNTER — Telehealth (HOSPITAL_COMMUNITY): Payer: Self-pay | Admitting: *Deleted

## 2013-07-27 ENCOUNTER — Encounter (HOSPITAL_COMMUNITY): Payer: Medicare Other

## 2013-07-30 ENCOUNTER — Encounter (HOSPITAL_COMMUNITY)
Admission: RE | Admit: 2013-07-30 | Discharge: 2013-07-30 | Disposition: A | Discharge status: Home / Self Care | Payer: Medicare Other | Source: Ambulatory Visit | Attending: Cardiology | Admitting: Cardiology

## 2013-07-30 DIAGNOSIS — I2582 Chronic total occlusion of coronary artery: Secondary | ICD-10-CM | POA: Diagnosis not present

## 2013-07-30 DIAGNOSIS — E1169 Type 2 diabetes mellitus with other specified complication: Secondary | ICD-10-CM | POA: Insufficient documentation

## 2013-07-30 DIAGNOSIS — I4891 Unspecified atrial fibrillation: Secondary | ICD-10-CM | POA: Diagnosis not present

## 2013-07-30 DIAGNOSIS — Z5189 Encounter for other specified aftercare: Secondary | ICD-10-CM | POA: Diagnosis not present

## 2013-07-30 DIAGNOSIS — Z952 Presence of prosthetic heart valve: Secondary | ICD-10-CM | POA: Diagnosis not present

## 2013-07-30 DIAGNOSIS — I252 Old myocardial infarction: Secondary | ICD-10-CM | POA: Diagnosis not present

## 2013-07-30 DIAGNOSIS — I5032 Chronic diastolic (congestive) heart failure: Secondary | ICD-10-CM | POA: Insufficient documentation

## 2013-07-30 LAB — GLUCOSE, CAPILLARY: Glucose-Capillary: 280 mg/dL — ABNORMAL HIGH (ref 70–99)

## 2013-07-31 ENCOUNTER — Encounter: Payer: Self-pay | Admitting: Cardiology

## 2013-07-31 NOTE — Progress Notes (Signed)
[name] returned to exercise at cardiac rehab yesterday after being out with the flu. No problems noted.

## 2013-08-01 ENCOUNTER — Encounter (HOSPITAL_COMMUNITY): Payer: Medicare Other

## 2013-08-03 ENCOUNTER — Encounter (HOSPITAL_COMMUNITY)
Admission: RE | Admit: 2013-08-03 | Discharge: 2013-08-03 | Disposition: A | Discharge status: Home / Self Care | Payer: Medicare Other | Source: Ambulatory Visit | Attending: Cardiology | Admitting: Cardiology

## 2013-08-03 LAB — GLUCOSE, CAPILLARY: GLUCOSE-CAPILLARY: 195 mg/dL — AB (ref 70–99)

## 2013-08-06 ENCOUNTER — Encounter (HOSPITAL_COMMUNITY): Payer: Medicare Other

## 2013-08-08 ENCOUNTER — Encounter (HOSPITAL_COMMUNITY): Payer: Medicare Other

## 2013-08-10 ENCOUNTER — Encounter (HOSPITAL_COMMUNITY)
Admission: RE | Admit: 2013-08-10 | Discharge: 2013-08-10 | Disposition: A | Discharge status: Home / Self Care | Payer: Medicare Other | Source: Ambulatory Visit | Attending: Cardiology | Admitting: Cardiology

## 2013-08-13 ENCOUNTER — Ambulatory Visit
Admission: RE | Admit: 2013-08-13 | Discharge: 2013-08-13 | Disposition: A | Discharge status: Home / Self Care | Payer: Medicare Other | Source: Ambulatory Visit | Attending: Thoracic Surgery (Cardiothoracic Vascular Surgery) | Admitting: Thoracic Surgery (Cardiothoracic Vascular Surgery)

## 2013-08-13 ENCOUNTER — Encounter (HOSPITAL_COMMUNITY): Payer: Medicare Other

## 2013-08-13 ENCOUNTER — Other Ambulatory Visit: Payer: Self-pay

## 2013-08-13 ENCOUNTER — Ambulatory Visit (INDEPENDENT_AMBULATORY_CARE_PROVIDER_SITE_OTHER): Payer: Medicare Other | Admitting: Thoracic Surgery (Cardiothoracic Vascular Surgery)

## 2013-08-13 ENCOUNTER — Encounter: Payer: Self-pay | Admitting: Thoracic Surgery (Cardiothoracic Vascular Surgery)

## 2013-08-13 VITALS — BP 153/65 | HR 56 | Resp 16 | Ht 62.0 in | Wt 180.0 lb

## 2013-08-13 DIAGNOSIS — I350 Nonrheumatic aortic (valve) stenosis: Secondary | ICD-10-CM

## 2013-08-13 DIAGNOSIS — D381 Neoplasm of uncertain behavior of trachea, bronchus and lung: Secondary | ICD-10-CM

## 2013-08-13 DIAGNOSIS — Z8679 Personal history of other diseases of the circulatory system: Secondary | ICD-10-CM

## 2013-08-13 DIAGNOSIS — I359 Nonrheumatic aortic valve disorder, unspecified: Secondary | ICD-10-CM

## 2013-08-13 DIAGNOSIS — Z954 Presence of other heart-valve replacement: Secondary | ICD-10-CM

## 2013-08-13 DIAGNOSIS — J984 Other disorders of lung: Secondary | ICD-10-CM | POA: Diagnosis not present

## 2013-08-13 DIAGNOSIS — Z952 Presence of prosthetic heart valve: Secondary | ICD-10-CM

## 2013-08-13 DIAGNOSIS — I4891 Unspecified atrial fibrillation: Secondary | ICD-10-CM

## 2013-08-13 DIAGNOSIS — Z9889 Other specified postprocedural states: Secondary | ICD-10-CM

## 2013-08-13 DIAGNOSIS — Z953 Presence of xenogenic heart valve: Secondary | ICD-10-CM

## 2013-08-13 DIAGNOSIS — R911 Solitary pulmonary nodule: Secondary | ICD-10-CM

## 2013-08-13 NOTE — Progress Notes (Signed)
[name]       [place] [address]             [phone]     CARDIOTHORACIC SURGERY OFFICE NOTE  Referring Provider is Lelon Perla, MD PCP is Walker Kehr, MD   HPI:  Patient returns for routine followup status post aortic valve replacement using a bioprosthetic tissue valve and Maze procedure on 04/12/2013.  Her postoperative recovery has been remarkably uncomplicated. She was last seen here in our office on 05/14/2013.  Since then she has continued to remain stable from a cardiovascular standpoint. She has had her Coumadin dose adjusted and monitored through the [facility]. She is currently participating in the cardiac rehabilitation program. She still reports significant exertional shortness of breath, but she notes that her breathing is considerably better than it was prior to surgery. She denies any resting shortness of breath, PND, orthopnea, or lower extremity edema. Her biggest complaint is that she continues to have "mucus" that requires her to have to "spit continuously". Overall she feels well. She has not had any chest discomfort. She denies any tachypalpitations or dizzy spells. She reports her appetite is still marginal. She has very mild residual soreness in her chest that is exacerbated with coughing spells.    Current Outpatient Prescriptions  Medication Sig Dispense Refill  . cinacalcet (SENSIPAR) 60 MG tablet Take 60 mg by mouth every other day.      . dapsone 25 MG tablet Take 25 mg by mouth daily.       . diphenhydrAMINE (BENADRYL) 50 MG capsule Take 1 capsule (50 mg total) by mouth every 6 (six) hours as needed for itching.  90 capsule  3  . Flurandrenolide 4 MCG/SQCM TAPE Apply topically daily.      . furosemide (LASIX) 80 MG tablet Take 1 tablet (80 mg total) by mouth 2 (two) times daily.  60 tablet  1  . glipiZIDE (GLUCOTROL XL) 10 MG 24 hr tablet Take 1 tablet (10 mg total) by mouth daily.  30 tablet  11  . glucose  blood test strip Use as instructed qid ac and hs  100 each  12  . insulin lispro (HUMALOG) 100 UNIT/ML SOPN Inject 8 Units into the skin 4 (four) times daily -  before meals and at bedtime.  12 mL  11  . Insulin Pen Needle 31G X 5 MM MISC Test up to TID dx:250.00  100 each  11  . levothyroxine (LEVOTHROID) 25 MCG tablet Take 1 tablet (25 mcg total) by mouth daily.  30 tablet  11  . metoprolol tartrate (LOPRESSOR) 25 MG tablet TAKE 1 TABLET (25 MG TOTAL) BY MOUTH 2 (TWO) TIMES DAILY.  60 tablet  2  . mycophenolate (MYFORTIC) 180 MG EC tablet Take 360 mg by mouth 2 (two) times daily.       [name] DELICA LANCETS FINE MISC Test up to TID DX:250.00  100 each  11  . oxyCODONE (OXY IR/ROXICODONE) 5 MG immediate release tablet Take 1-2 tablets (5-10 mg total) by mouth every 6 (six) hours as needed.  120 tablet  0  . pravastatin (PRAVACHOL) 40 MG tablet Take 40 mg by mouth daily. Take one tablet Mon, Wed, and Friday      . tacrolimus (PROGRAF) 1 MG capsule Take 5 mg by mouth 2 (two) times daily. Take 5 capsules in the am, and 5 capsules in the pm.      .  triamcinolone cream (KENALOG) 0.1 % Apply 1 application topically 2 (two) times daily.  60 g  3  . warfarin (COUMADIN) 5 MG tablet Take 2.5 mg by mouth daily. AS DIRECTED       No current facility-administered medications for this visit.      Physical Exam:   BP 153/65  Pulse 56  Resp 16  Ht 5\' 2"  (1.575 m)  Wt 180 lb (81.647 kg)  BMI 32.91 kg/m2  SpO2 95%  General:  Obese but well-appearing  Chest:   Clear to auscultation  CV:   Regular rate and rhythm with soft systolic murmur  Incisions:  Well-healed, sternum is stable  Abdomen:  Soft and nontender  Extremities:  Warm and well-perfused  Diagnostic Tests:  2 channel telemetry rhythm strip demonstrates what appears to be sinus rhythm or junctional rhythm. He remains difficult to see discrete P waves but the rhythm is narrow complex and regular.   Impression:  Patient is clinically  doing well more than 3 months following aortic valve replacement using a bioprosthetic tissue valve and Maze procedure. She has stable symptoms of exertional shortness of breath, probably functional class II. She appears to be maintaining sinus rhythm. She has not had complications with Coumadin therapy.  Plan:  I've instructed the patient to stop taking amiodarone. I've encouraged her to continue to increase her physical activity. We will obtain followup noncontrast CT scan of the chest to followup the small groundglass opacity seen in the right upper lobe on previous CT scan. The patient will return in 3 months for rhythm check.   Valentina Gu. Roxy Manns, MD 08/13/2013 1:07 PM

## 2013-08-13 NOTE — Patient Instructions (Addendum)
Stop taking amiodarone (PACERONE) and let the pharmacist know at the coumadin clinic that you are no longer taking amiodarone.   Endocarditis is a potentially serious infection of heart valves or inside lining of the heart.  It occurs more commonly in patients with diseased heart valves (such as patient's with aortic or mitral valve disease) and in patients who have undergone heart valve repair or replacement.  Certain surgical and dental procedures may put you at risk, such as dental cleaning, other dental procedures, or any surgery involving the respiratory, urinary, gastrointestinal tract, gallbladder or prostate gland.   To minimize your chances for develooping endocarditis, maintain good oral health and seek prompt medical attention for any infections involving the mouth, teeth, gums, skin or urinary tract.  Always notify your doctor or dentist about your underlying heart valve condition before having any invasive procedures. You will need to take antibiotics before certain procedures.

## 2013-08-14 ENCOUNTER — Ambulatory Visit (INDEPENDENT_AMBULATORY_CARE_PROVIDER_SITE_OTHER): Payer: Medicare Other | Admitting: *Deleted

## 2013-08-14 ENCOUNTER — Other Ambulatory Visit: Payer: Self-pay | Admitting: *Deleted

## 2013-08-14 DIAGNOSIS — Z7901 Long term (current) use of anticoagulants: Secondary | ICD-10-CM

## 2013-08-14 DIAGNOSIS — Z79899 Other long term (current) drug therapy: Secondary | ICD-10-CM | POA: Diagnosis not present

## 2013-08-14 DIAGNOSIS — D631 Anemia in chronic kidney disease: Secondary | ICD-10-CM | POA: Diagnosis not present

## 2013-08-14 DIAGNOSIS — Z5181 Encounter for therapeutic drug level monitoring: Secondary | ICD-10-CM | POA: Diagnosis not present

## 2013-08-14 DIAGNOSIS — I4891 Unspecified atrial fibrillation: Secondary | ICD-10-CM

## 2013-08-14 DIAGNOSIS — N189 Chronic kidney disease, unspecified: Secondary | ICD-10-CM | POA: Diagnosis not present

## 2013-08-14 LAB — POCT INR: INR: 2.2

## 2013-08-14 MED ORDER — AMOXICILLIN 500 MG PO TABS: ORAL_TABLET | ORAL | Status: DC

## 2013-08-15 ENCOUNTER — Encounter (HOSPITAL_COMMUNITY)
Admission: RE | Admit: 2013-08-15 | Discharge: 2013-08-15 | Disposition: A | Discharge status: Home / Self Care | Payer: Medicare Other | Source: Ambulatory Visit | Attending: Cardiology | Admitting: Cardiology

## 2013-08-15 LAB — GLUCOSE, CAPILLARY: Glucose-Capillary: 170 mg/dL — ABNORMAL HIGH (ref 70–99)

## 2013-08-17 ENCOUNTER — Encounter (HOSPITAL_COMMUNITY)
Admission: RE | Admit: 2013-08-17 | Discharge: 2013-08-17 | Disposition: A | Discharge status: Home / Self Care | Payer: Medicare Other | Source: Ambulatory Visit | Attending: Cardiology | Admitting: Cardiology

## 2013-08-17 LAB — GLUCOSE, CAPILLARY: Glucose-Capillary: 250 mg/dL — ABNORMAL HIGH (ref 70–99)

## 2013-08-20 ENCOUNTER — Encounter (HOSPITAL_COMMUNITY): Payer: Medicare Other

## 2013-08-20 ENCOUNTER — Encounter (HOSPITAL_COMMUNITY)
Admission: RE | Admit: 2013-08-20 | Discharge: 2013-08-20 | Disposition: A | Discharge status: Home / Self Care | Payer: Medicare Other | Source: Ambulatory Visit | Attending: Cardiology | Admitting: Cardiology

## 2013-08-20 DIAGNOSIS — I5032 Chronic diastolic (congestive) heart failure: Secondary | ICD-10-CM | POA: Insufficient documentation

## 2013-08-20 DIAGNOSIS — Z5189 Encounter for other specified aftercare: Secondary | ICD-10-CM | POA: Insufficient documentation

## 2013-08-20 DIAGNOSIS — I252 Old myocardial infarction: Secondary | ICD-10-CM | POA: Insufficient documentation

## 2013-08-20 DIAGNOSIS — I4891 Unspecified atrial fibrillation: Secondary | ICD-10-CM | POA: Diagnosis not present

## 2013-08-20 DIAGNOSIS — E1169 Type 2 diabetes mellitus with other specified complication: Secondary | ICD-10-CM | POA: Diagnosis not present

## 2013-08-20 DIAGNOSIS — Z952 Presence of prosthetic heart valve: Secondary | ICD-10-CM | POA: Insufficient documentation

## 2013-08-20 DIAGNOSIS — I2582 Chronic total occlusion of coronary artery: Secondary | ICD-10-CM | POA: Insufficient documentation

## 2013-08-21 LAB — GLUCOSE, CAPILLARY: Glucose-Capillary: 192 mg/dL — ABNORMAL HIGH (ref 70–99)

## 2013-08-22 ENCOUNTER — Encounter (HOSPITAL_COMMUNITY)
Admission: RE | Admit: 2013-08-22 | Discharge: 2013-08-22 | Disposition: A | Discharge status: Home / Self Care | Payer: Medicare Other | Source: Ambulatory Visit | Attending: Cardiology | Admitting: Cardiology

## 2013-08-22 LAB — GLUCOSE, CAPILLARY: Glucose-Capillary: 164 mg/dL — ABNORMAL HIGH (ref 70–99)

## 2013-08-24 ENCOUNTER — Encounter (HOSPITAL_COMMUNITY)
Admission: RE | Admit: 2013-08-24 | Discharge: 2013-08-24 | Disposition: A | Discharge status: Home / Self Care | Payer: Medicare Other | Source: Ambulatory Visit | Attending: Cardiology | Admitting: Cardiology

## 2013-08-27 ENCOUNTER — Encounter (HOSPITAL_COMMUNITY)
Admission: RE | Admit: 2013-08-27 | Discharge: 2013-08-27 | Disposition: A | Discharge status: Home / Self Care | Payer: Medicare Other | Source: Ambulatory Visit | Attending: Cardiology | Admitting: Cardiology

## 2013-08-27 LAB — GLUCOSE, CAPILLARY: Glucose-Capillary: 263 mg/dL — ABNORMAL HIGH (ref 70–99)

## 2013-08-29 ENCOUNTER — Encounter (HOSPITAL_COMMUNITY): Payer: Medicare Other

## 2013-08-30 ENCOUNTER — Ambulatory Visit (INDEPENDENT_AMBULATORY_CARE_PROVIDER_SITE_OTHER): Payer: Medicare Other | Admitting: Pharmacist

## 2013-08-30 DIAGNOSIS — Z5181 Encounter for therapeutic drug level monitoring: Secondary | ICD-10-CM | POA: Diagnosis not present

## 2013-08-30 DIAGNOSIS — Z7901 Long term (current) use of anticoagulants: Secondary | ICD-10-CM

## 2013-08-30 DIAGNOSIS — I4891 Unspecified atrial fibrillation: Secondary | ICD-10-CM

## 2013-08-30 LAB — POCT INR: INR: 1.5

## 2013-08-31 ENCOUNTER — Encounter (HOSPITAL_COMMUNITY)
Admission: RE | Admit: 2013-08-31 | Discharge: 2013-08-31 | Disposition: A | Discharge status: Home / Self Care | Payer: Medicare Other | Source: Ambulatory Visit | Attending: Cardiology | Admitting: Cardiology

## 2013-09-03 ENCOUNTER — Encounter (HOSPITAL_COMMUNITY): Payer: Medicare Other

## 2013-09-04 ENCOUNTER — Ambulatory Visit: Payer: Medicare Other | Admitting: Cardiology

## 2013-09-05 ENCOUNTER — Encounter (HOSPITAL_COMMUNITY): Payer: Medicare Other

## 2013-09-07 ENCOUNTER — Encounter (HOSPITAL_COMMUNITY): Payer: Medicare Other

## 2013-09-10 ENCOUNTER — Encounter (HOSPITAL_COMMUNITY)
Admission: RE | Admit: 2013-09-10 | Discharge: 2013-09-10 | Disposition: A | Discharge status: Home / Self Care | Payer: Medicare Other | Source: Ambulatory Visit | Attending: Cardiology | Admitting: Cardiology

## 2013-09-11 ENCOUNTER — Other Ambulatory Visit: Payer: Self-pay | Admitting: Cardiology

## 2013-09-11 ENCOUNTER — Other Ambulatory Visit: Payer: Self-pay | Admitting: Internal Medicine

## 2013-09-12 ENCOUNTER — Encounter (HOSPITAL_COMMUNITY)
Admission: RE | Admit: 2013-09-12 | Discharge: 2013-09-12 | Disposition: A | Discharge status: Home / Self Care | Payer: Medicare Other | Source: Ambulatory Visit | Attending: Cardiology | Admitting: Cardiology

## 2013-09-14 ENCOUNTER — Encounter (HOSPITAL_COMMUNITY): Payer: Medicare Other

## 2013-09-17 ENCOUNTER — Encounter (HOSPITAL_COMMUNITY)
Admission: RE | Admit: 2013-09-17 | Discharge: 2013-09-17 | Disposition: A | Discharge status: Home / Self Care | Payer: Medicare Other | Source: Ambulatory Visit | Attending: Cardiology | Admitting: Cardiology

## 2013-09-17 DIAGNOSIS — I2582 Chronic total occlusion of coronary artery: Secondary | ICD-10-CM | POA: Insufficient documentation

## 2013-09-17 DIAGNOSIS — E1169 Type 2 diabetes mellitus with other specified complication: Secondary | ICD-10-CM | POA: Diagnosis not present

## 2013-09-17 DIAGNOSIS — I4891 Unspecified atrial fibrillation: Secondary | ICD-10-CM | POA: Insufficient documentation

## 2013-09-17 DIAGNOSIS — I5032 Chronic diastolic (congestive) heart failure: Secondary | ICD-10-CM | POA: Diagnosis not present

## 2013-09-17 DIAGNOSIS — Z5189 Encounter for other specified aftercare: Secondary | ICD-10-CM | POA: Diagnosis not present

## 2013-09-17 DIAGNOSIS — I252 Old myocardial infarction: Secondary | ICD-10-CM | POA: Diagnosis not present

## 2013-09-17 DIAGNOSIS — Z952 Presence of prosthetic heart valve: Secondary | ICD-10-CM | POA: Diagnosis not present

## 2013-09-19 ENCOUNTER — Encounter (HOSPITAL_COMMUNITY): Payer: Medicare Other

## 2013-09-19 ENCOUNTER — Ambulatory Visit (INDEPENDENT_AMBULATORY_CARE_PROVIDER_SITE_OTHER): Payer: Medicare Other | Admitting: *Deleted

## 2013-09-19 DIAGNOSIS — Z7901 Long term (current) use of anticoagulants: Secondary | ICD-10-CM | POA: Diagnosis not present

## 2013-09-19 DIAGNOSIS — Z5181 Encounter for therapeutic drug level monitoring: Secondary | ICD-10-CM

## 2013-09-19 DIAGNOSIS — D631 Anemia in chronic kidney disease: Secondary | ICD-10-CM | POA: Diagnosis not present

## 2013-09-19 DIAGNOSIS — N184 Chronic kidney disease, stage 4 (severe): Secondary | ICD-10-CM | POA: Diagnosis not present

## 2013-09-19 DIAGNOSIS — N2581 Secondary hyperparathyroidism of renal origin: Secondary | ICD-10-CM | POA: Diagnosis not present

## 2013-09-19 DIAGNOSIS — Z940 Kidney transplant status: Secondary | ICD-10-CM | POA: Diagnosis not present

## 2013-09-19 DIAGNOSIS — I4891 Unspecified atrial fibrillation: Secondary | ICD-10-CM | POA: Diagnosis not present

## 2013-09-19 LAB — POCT INR: INR: 2.7

## 2013-09-21 ENCOUNTER — Encounter (HOSPITAL_COMMUNITY): Payer: Medicare Other

## 2013-09-24 ENCOUNTER — Encounter (HOSPITAL_COMMUNITY): Payer: Medicare Other

## 2013-09-25 DIAGNOSIS — N039 Chronic nephritic syndrome with unspecified morphologic changes: Secondary | ICD-10-CM | POA: Diagnosis not present

## 2013-09-25 DIAGNOSIS — N2581 Secondary hyperparathyroidism of renal origin: Secondary | ICD-10-CM | POA: Diagnosis not present

## 2013-09-25 DIAGNOSIS — D631 Anemia in chronic kidney disease: Secondary | ICD-10-CM | POA: Diagnosis not present

## 2013-09-25 DIAGNOSIS — Z79899 Other long term (current) drug therapy: Secondary | ICD-10-CM | POA: Diagnosis not present

## 2013-09-26 ENCOUNTER — Encounter (HOSPITAL_COMMUNITY)
Admission: RE | Admit: 2013-09-26 | Discharge: 2013-09-26 | Disposition: A | Discharge status: Home / Self Care | Payer: Medicare Other | Source: Ambulatory Visit | Attending: Cardiology | Admitting: Cardiology

## 2013-09-28 ENCOUNTER — Encounter (HOSPITAL_COMMUNITY)
Admission: RE | Admit: 2013-09-28 | Discharge: 2013-09-28 | Disposition: A | Discharge status: Home / Self Care | Payer: Medicare Other | Source: Ambulatory Visit | Attending: Cardiology | Admitting: Cardiology

## 2013-10-01 ENCOUNTER — Encounter (HOSPITAL_COMMUNITY)
Admission: RE | Admit: 2013-10-01 | Discharge: 2013-10-01 | Disposition: A | Discharge status: Home / Self Care | Payer: Medicare Other | Source: Ambulatory Visit | Attending: Cardiology | Admitting: Cardiology

## 2013-10-03 ENCOUNTER — Encounter (HOSPITAL_COMMUNITY)
Admission: RE | Admit: 2013-10-03 | Discharge: 2013-10-03 | Disposition: A | Discharge status: Home / Self Care | Payer: Medicare Other | Source: Ambulatory Visit | Attending: Cardiology | Admitting: Cardiology

## 2013-10-05 ENCOUNTER — Encounter (HOSPITAL_COMMUNITY)
Admission: RE | Admit: 2013-10-05 | Discharge: 2013-10-05 | Disposition: A | Discharge status: Home / Self Care | Payer: Medicare Other | Source: Ambulatory Visit | Attending: Cardiology | Admitting: Cardiology

## 2013-10-05 NOTE — Progress Notes (Signed)
[name] graduates today. [name] was encouraged to continue exercise on her own.

## 2013-10-09 ENCOUNTER — Encounter: Payer: Self-pay | Admitting: Internal Medicine

## 2013-10-09 ENCOUNTER — Ambulatory Visit (INDEPENDENT_AMBULATORY_CARE_PROVIDER_SITE_OTHER): Payer: Medicare Other | Admitting: Internal Medicine

## 2013-10-09 VITALS — BP 130/66 | HR 68 | Temp 98.6°F | Resp 16 | Wt 182.0 lb

## 2013-10-09 DIAGNOSIS — R109 Unspecified abdominal pain: Secondary | ICD-10-CM

## 2013-10-09 DIAGNOSIS — I2510 Atherosclerotic heart disease of native coronary artery without angina pectoris: Secondary | ICD-10-CM

## 2013-10-09 DIAGNOSIS — Z23 Encounter for immunization: Secondary | ICD-10-CM | POA: Diagnosis not present

## 2013-10-09 DIAGNOSIS — I509 Heart failure, unspecified: Secondary | ICD-10-CM

## 2013-10-09 DIAGNOSIS — E785 Hyperlipidemia, unspecified: Secondary | ICD-10-CM

## 2013-10-09 DIAGNOSIS — K219 Gastro-esophageal reflux disease without esophagitis: Secondary | ICD-10-CM

## 2013-10-09 MED ORDER — OXYCODONE HCL 5 MG PO TABS: 5.0000 mg | ORAL_TABLET | Freq: Four times a day (QID) | ORAL | Status: DC | PRN

## 2013-10-09 MED ORDER — MAGIC MOUTHWASH: 5.0000 mL | Freq: Four times a day (QID) | ORAL | Status: DC

## 2013-10-09 NOTE — Progress Notes (Signed)
Pre visit review using our clinic review tool, if applicable. No additional management support is needed unless otherwise documented below in the visit note. 

## 2013-10-10 ENCOUNTER — Ambulatory Visit (INDEPENDENT_AMBULATORY_CARE_PROVIDER_SITE_OTHER): Payer: Medicare Other | Admitting: Pharmacist

## 2013-10-10 ENCOUNTER — Other Ambulatory Visit: Payer: Self-pay | Admitting: Internal Medicine

## 2013-10-10 ENCOUNTER — Other Ambulatory Visit: Payer: Self-pay | Admitting: *Deleted

## 2013-10-10 DIAGNOSIS — Z5181 Encounter for therapeutic drug level monitoring: Secondary | ICD-10-CM

## 2013-10-10 DIAGNOSIS — Z7901 Long term (current) use of anticoagulants: Secondary | ICD-10-CM | POA: Diagnosis not present

## 2013-10-10 DIAGNOSIS — I4891 Unspecified atrial fibrillation: Secondary | ICD-10-CM

## 2013-10-10 LAB — POCT INR: INR: 2.2

## 2013-10-10 MED ORDER — WARFARIN SODIUM 5 MG PO TABS: ORAL_TABLET | ORAL | Status: DC

## 2013-10-11 NOTE — Assessment & Plan Note (Signed)
Continue with current prescription therapy as reflected on the Med list.  

## 2013-10-11 NOTE — Progress Notes (Signed)
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  Pt with severe aortic stenosis now s/p AVR on 04/11/13.  F/u elev CBG: 200s ac and 300s pc: using Insulin tid - better CBGs. No low CBGs  Novolog pen is not covered, Humalog is covered   Diabetes Hypoglycemia symptoms include nervousness/anxiousness. Pertinent negatives for hypoglycemia include no confusion, dizziness, headaches, pallor or tremors. Associated symptoms include fatigue. Pertinent negatives for diabetes include no weakness.  Rash This is a chronic problem. The current episode started more than 1 year ago. The problem has been gradually worsening since onset. The affected locations include the chest, right wrist, right arm, left wrist, right lower leg and right upper leg. She was exposed to nothing. Associated symptoms include fatigue. Pertinent negatives include no congestion or cough. Past treatments include anti-itch cream and topical steroids. The treatment provided mild relief. Her past medical history is significant for eczema.    The patient presents for a follow-up of  Chronic ESRD, hypertension, chronic dyslipidemia, type 2 diabetes not controlled with medicines. She just had a renal tranplant in 8/12. F/u itching and rash - not better.    BP Readings from Last 3 Encounters:  10/09/13 130/66  08/13/13 153/65  07/10/13 150/60   Wt Readings from Last 3 Encounters:  10/09/13 182 lb (82.555 kg)  08/13/13 180 lb (81.647 kg)  07/10/13 187 lb (84.823 kg)     Review of Systems  Constitutional: Positive for fatigue. Negative for chills, activity change, appetite change and unexpected weight change.  HENT: Negative for congestion, mouth sores and sinus pressure.   Eyes: Negative for visual disturbance.  Respiratory: Negative for  cough and chest tightness.   Gastrointestinal: Negative for nausea and abdominal pain.  Genitourinary: Negative for frequency, difficulty urinating and vaginal pain.  Musculoskeletal: Positive for arthralgias and gait problem. Negative for back pain.  Skin: Negative for pallor and rash.       pruritis  Neurological: Negative for dizziness, tremors, weakness, numbness and headaches.  Psychiatric/Behavioral: Negative for confusion and sleep disturbance. The patient is nervous/anxious.        Objective:   Physical Exam  Constitutional: No distress.  Looks much better  HENT:  Head: Normocephalic.  Right Ear: External ear normal.  Left Ear: External ear normal.  Nose: Nose normal.  Mouth/Throat: Oropharynx is clear and moist.  Eyes: Conjunctivae are normal. Pupils are equal, round, and reactive to light. Right eye exhibits no discharge. Left eye exhibits no discharge.  Neck: Normal range of motion. Neck supple. No JVD present. No tracheal deviation present. No thyromegaly present.  Cardiovascular: Normal rate and regular rhythm.  Exam reveals no gallop and no friction rub.   Murmur (2/6) heard. Pulmonary/Chest: No stridor. No respiratory distress. She has no wheezes.  Abdominal: Soft. Bowel sounds are normal. She exhibits no distension and no mass. There is no tenderness. There is no rebound and no guarding.  Musculoskeletal: She exhibits no edema and no tenderness.  Walking w/a cane  Lymphadenopathy:    She has no cervical adenopathy.  Neurological: She displays normal reflexes. No cranial nerve deficit. She exhibits normal muscle tone. Coordination normal.  Skin: No rash (hives and hyperpigmentation on LE and UEs B) noted. No erythema.  Scar on chest and  Psychiatric: She has a normal mood and affect. Her behavior is normal. Thought content normal.   Graft in L arm seems to be ok  Lab Results  Component Value Date   WBC 13.9* 04/22/2013   HGB 9.6* 04/22/2013   HCT 26.2*  04/22/2013   PLT 316 04/22/2013   GLUCOSE 216* 07/10/2013   CHOL 164 04/06/2013   TRIG 125 04/06/2013   HDL 73 04/06/2013   LDLDIRECT 89.3 06/13/2006   LDLCALC 66 04/06/2013   ALT 45* 07/10/2013   AST 30 07/10/2013   NA 138 07/10/2013   K 4.7 07/10/2013   CL 105 07/10/2013   CREATININE 1.8* 07/10/2013   BUN 33* 07/10/2013   CO2 26 07/10/2013   TSH 13.22* 07/10/2013   INR 2.2 10/10/2013   HGBA1C 8.3* 07/10/2013   MICROALBUR 168.6* 10/31/2006       Assessment & Plan:

## 2013-10-29 ENCOUNTER — Ambulatory Visit (INDEPENDENT_AMBULATORY_CARE_PROVIDER_SITE_OTHER): Payer: Medicare Other | Admitting: Cardiology

## 2013-10-29 ENCOUNTER — Encounter: Payer: Self-pay | Admitting: Cardiology

## 2013-10-29 ENCOUNTER — Ambulatory Visit (INDEPENDENT_AMBULATORY_CARE_PROVIDER_SITE_OTHER): Payer: Medicare Other | Admitting: Pharmacist

## 2013-10-29 VITALS — BP 147/79 | HR 65 | Ht 62.0 in | Wt 185.0 lb

## 2013-10-29 DIAGNOSIS — Z79899 Other long term (current) drug therapy: Secondary | ICD-10-CM | POA: Diagnosis not present

## 2013-10-29 DIAGNOSIS — Z5181 Encounter for therapeutic drug level monitoring: Secondary | ICD-10-CM

## 2013-10-29 DIAGNOSIS — Z8679 Personal history of other diseases of the circulatory system: Secondary | ICD-10-CM

## 2013-10-29 DIAGNOSIS — I359 Nonrheumatic aortic valve disorder, unspecified: Secondary | ICD-10-CM | POA: Diagnosis not present

## 2013-10-29 DIAGNOSIS — Z9889 Other specified postprocedural states: Secondary | ICD-10-CM

## 2013-10-29 DIAGNOSIS — Z7901 Long term (current) use of anticoagulants: Secondary | ICD-10-CM | POA: Diagnosis not present

## 2013-10-29 DIAGNOSIS — I4891 Unspecified atrial fibrillation: Secondary | ICD-10-CM | POA: Diagnosis not present

## 2013-10-29 DIAGNOSIS — I10 Essential (primary) hypertension: Secondary | ICD-10-CM

## 2013-10-29 DIAGNOSIS — I2510 Atherosclerotic heart disease of native coronary artery without angina pectoris: Secondary | ICD-10-CM | POA: Diagnosis not present

## 2013-10-29 DIAGNOSIS — Z954 Presence of other heart-valve replacement: Secondary | ICD-10-CM | POA: Diagnosis not present

## 2013-10-29 DIAGNOSIS — Z952 Presence of prosthetic heart valve: Secondary | ICD-10-CM

## 2013-10-29 DIAGNOSIS — I6529 Occlusion and stenosis of unspecified carotid artery: Secondary | ICD-10-CM

## 2013-10-29 DIAGNOSIS — E785 Hyperlipidemia, unspecified: Secondary | ICD-10-CM

## 2013-10-29 DIAGNOSIS — Z940 Kidney transplant status: Secondary | ICD-10-CM | POA: Diagnosis not present

## 2013-10-29 DIAGNOSIS — D631 Anemia in chronic kidney disease: Secondary | ICD-10-CM | POA: Diagnosis not present

## 2013-10-29 LAB — POCT INR: INR: 1.8

## 2013-10-29 NOTE — Assessment & Plan Note (Signed)
Continue statin. Not on aspirin given need for Coumadin. 

## 2013-10-29 NOTE — Assessment & Plan Note (Signed)
Followed by nephrology. 

## 2013-10-29 NOTE — Assessment & Plan Note (Signed)
Blood pressure controlled. Continue present medications. 

## 2013-10-29 NOTE — Assessment & Plan Note (Signed)
Continue statin. Followup carotid Dopplers in October 2015.

## 2013-10-29 NOTE — Progress Notes (Signed)
HPI: FU aortic stenosis s/p AVR, pericardial effusion and atrial fibrillation. Patient has a history of embolic CVA. She has a history of coronary artery disease, status post PCI of the right coronary artery. She underwent cardiac catheterization in September 2014 for severe AS. The right coronary was totally occluded but no other coronary disease noted. Preoperative carotid Dopplers in September of 2014 showed 1-39% bilateral stenosis. She was scheduled for surgery but had a sheath placed into her right carotid artery by mistake. This required surgical repair. Patient subsequently had aortic valve replacement with a pericardial tissue valve. She also had a Maze procedure performed. Postoperative echocardiogram showed normal LV function, pericardial tissue valve with no aortic insufficiency, mild left atrial enlargement, mild to moderate tricuspid regurgitation and a trivial pericardial effusion. Note patient had a chest CT in September of 2014 preoperatively that showed nodule in the right upper lobe. Followup recommended in 3 months. Followup study in January of 2015 showed a 10 mm right upper lobe nodule. Followup recommended in one year. Since she was last seen She denies dyspnea, chest pain pedal edema or syncope. Some lower extremity weakness.   Current Outpatient Prescriptions  Medication Sig Dispense Refill  . Alum & Mag Hydroxide-Simeth (MAGIC MOUTHWASH) SOLN Take 5 mLs by mouth 4 (four) times daily. Swish, hold and swallow  500 mL  1  . amoxicillin (AMOXIL) 500 MG tablet Take all four tablets one hour prior to procedure  4 tablet  6  . dapsone 25 MG tablet Take 25 mg by mouth daily.       . diphenhydrAMINE (BENADRYL) 50 MG capsule Take 1 capsule (50 mg total) by mouth every 6 (six) hours as needed for itching.  90 capsule  3  . furosemide (LASIX) 80 MG tablet TAKE 1 TABLET BY MOUTH TWICE A DAY  60 tablet  1  . glipiZIDE (GLUCOTROL XL) 10 MG 24 hr tablet Take 1 tablet (10 mg total) by  mouth daily.  30 tablet  11  . glucose blood test strip Use as instructed qid ac and hs  100 each  12  . insulin lispro (HUMALOG) 100 UNIT/ML SOPN Inject 8 Units into the skin 4 (four) times daily -  before meals and at bedtime.  12 mL  11  . Insulin Pen Needle 31G X 5 MM MISC Test up to TID dx:250.00  100 each  11  . levothyroxine (LEVOTHROID) 25 MCG tablet Take 1 tablet (25 mcg total) by mouth daily.  30 tablet  11  . metoprolol tartrate (LOPRESSOR) 25 MG tablet TAKE 1 TABLET BY MOUTH TWICE A DAY  60 tablet  2  . mycophenolate (MYFORTIC) 180 MG EC tablet Take 360 mg by mouth 2 (two) times daily.       [name] DELICA LANCETS FINE MISC Test up to TID DX:250.00  100 each  11  . oxyCODONE (OXY IR/ROXICODONE) 5 MG immediate release tablet Take 1-2 tablets (5-10 mg total) by mouth every 6 (six) hours as needed.  120 tablet  0  . pravastatin (PRAVACHOL) 40 MG tablet TAKE 1 TABLET BY MOUTH AS DIRECTED  30 tablet  3  . tacrolimus (PROGRAF) 1 MG capsule Take 5 mg by mouth 2 (two) times daily. Take 5 capsules in the am, and 5 capsules in the pm.      . triamcinolone cream (KENALOG) 0.1 % Apply 1 application topically 2 (two) times daily.  60 g  3  . warfarin (COUMADIN) 5  MG tablet Take as directed by coumadin clinic  30 tablet  3   No current facility-administered medications for this visit.     Past Medical History  Diagnosis Date  . Coronary artery disease 05/2002    a. Ant MI 2003 s/p PTCA/stent to RCA.   Marland Kitchen Hypertension   . Hyperlipidemia   . CHF (congestive heart failure)   . Pericardial effusion     a. Small by echo 11/2011.  Marland Kitchen GERD (gastroesophageal reflux disease)   . 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 Unspecified hearing loss   . Anemia, iron deficiency     of chronic disease  . Helicobacter pylori (H. pylori) infection     hx of  . Esophagitis, reflux   . Gout   . Cholelithiasis   . Hx of colonic polyps   . Diverticulosis of colon   . Streptococcal  infection group D enterococcus     Recurrent Enterococcus bacteremia status post removal of infected graft on May 07, 2008, with removal of PermCath and subsequent replacement 06/2008.  Marland Kitchen Chronic cough onset 03/2010    Dr Melvyn Novas  . Carotid artery disease     a. Carotid Dopplers performed in August 2013 showed 40-59% left stenosis and 0-39% right; f/u recommended in 2 years.   . Aortic stenosis     a. Severe AS by echo 11/2012.  . Asystole     a. During ENT surgery 2005: developed marked asystole requiring CPR, felt due to vagal reaction (cath nonobst dz).  . Myocardial infarction 2003  . Cerebrovascular accident 2009    a. LMCA infarct felt embolic 5053, maintained on chronic coumadin.; denies residual on 04/05/2013  . Sleep apnea     Pt says testing was positive, intolerant of CPAP.  Marland Kitchen Type II diabetes mellitus   . ESRD (end stage renal disease)     a. Mass on L kidney per pt s/p nephrectomy - pt states not cancer - [facility] notes indicate ESRD due to HTN/DM - was previously on HD. b. Kidney transplant 02/2011.  . S/P kidney transplant 03/16/2011  . Chronic Persistent Atrial Fibrillation 12/31/2008    Qualifier: Diagnosis of  By: [name]    . Nodule of right lung 04/07/2013    Ground glass opacity right lung  . S/P aortic valve replacement with bioprosthetic valve and maze procedure 04/12/2013    53mm [facility] Fall Ease bovine pericardial tissue valve   . S/P Maze operation for atrial fibrillation 04/12/2013    Complete bilateral atrial lesion set using bipolar radiofrequency and cryothermy ablation with clipping of LA appendage  . Pruritic dermatitis     treated with steriods/UV light    Past Surgical History  Procedure Laterality Date  . Cholecystectomy  2009  . Arteriovenous graft placement Left   . Av fistula placement Right   . Nasal reconstruction with septal repair      "took it out" (04/05/2013)  . Tubal ligation    . Nephrectomy Left 2010    no CA on bx  . Kidney  transplant  03/16/11  . Cardioversion  05/29/2012    Procedure: CARDIOVERSION;  Surgeon: Lelon Perla, MD;  [facility] END[facility]COPY;  Service: Cardiovascular;  Laterality: N/A;  . Coronary angioplasty with stent placement Right     coronary artery  . Tonsillectomy    . Total abdominal hysterectomy    . Arteriovenous graft placement Left     "I've had 2 on my left;  had one removed" (04/05/2013)   . Av fistula repair Right     "took it out" ((/18/2014)  . Insertion of dialysis catheter Bilateral     "over the years; took them both out" (04/05/2013)  . Intraoperative transesophageal echocardiogram N/A 04/11/2013    Procedure: INTRAOPERATIVE TRANSESOPHAGEAL ECHOCARDIOGRAM;  Surgeon: Rexene Alberts, MD;  Location: [place];  Service: Open Heart Surgery;  Laterality: N/A;  . Artery exploration Right 04/11/2013    Procedure: ARTERY EXPLORATION;  Surgeon: Rexene Alberts, MD;  Location: [place];  Service: Open Heart Surgery;  Laterality: Right;  Right carotid artery exploration  . Aortic valve replacement N/A 04/12/2013    Procedure: AORTIC VALVE REPLACEMENT (AVR);  Surgeon: Rexene Alberts, MD;  Location: [place];  Service: Open Heart Surgery;  Laterality: N/A;  . Maze N/A 04/12/2013    Procedure: MAZE;  Surgeon: Rexene Alberts, MD;  Location: [place];  Service: Open Heart Surgery;  Laterality: N/A;  . Intraoperative transesophageal echocardiogram N/A 04/12/2013    Procedure: INTRAOPERATIVE TRANSESOPHAGEAL ECHOCARDIOGRAM;  Surgeon: Rexene Alberts, MD;  Location: [place];  Service: Open Heart Surgery;  Laterality: N/A;    History   Social History  . Marital Status: Married    Spouse Name: N/A    Number of Children: N/A  . Years of Education: N/A   Occupational History  . retired    Social History Main Topics  . Smoking status: Former Smoker -- 1.00 packs/day for 30 years    Types: Cigarettes    Quit date: 07/19/2001  . Smokeless tobacco: Never Used  . Alcohol Use: No  . Drug Use: No  . Sexual  Activity: Not Currently   Other Topics Concern  . Not on file   Social History Narrative   Patient signed a Designated Party Release to allow her spouse [name] and family and five children to have access to her medical records/information.      ROS: pruritis but no fevers or chills, productive cough, hemoptysis, dysphasia, odynophagia, melena, hematochezia, dysuria, hematuria, rash, seizure activity, orthopnea, PND, pedal edema, claudication. Remaining systems are negative.  Physical Exam: Well-developed well-nourished in no acute distress.  Skin is warm and dry.  HEENT is normal.  Neck is supple.  Chest is clear to auscultation with normal expansion.  Cardiovascular exam is regular rate and rhythm. 2/6 systolic murmur left border. No diastolic murmur. Abdominal exam nontender or distended. No masses palpated. Extremities show no edema. neuro grossly intact  ECG Sinus rhythm at a rate of 66. Left ventricular hypertrophy. Nonspecific ST changes.

## 2013-10-29 NOTE — Patient Instructions (Signed)
Your physician wants you to follow-up in: 6 MONTHS WITH DR CRENSHAW You will receive a reminder letter in the mail two months in advance. If you don't receive a letter, please call our office to schedule the follow-up appointment.  

## 2013-10-29 NOTE — Assessment & Plan Note (Signed)
Continue SBE prophylaxis. 

## 2013-10-29 NOTE — Assessment & Plan Note (Signed)
Continue statin. 

## 2013-10-29 NOTE — Assessment & Plan Note (Signed)
Patient remains in sinus rhythm. Amiodarone was discontinued. I will continue Coumadin for now. I am hesitant to discontinue this medicine given history of embolic CVA.

## 2013-11-05 ENCOUNTER — Encounter: Payer: Self-pay | Admitting: Thoracic Surgery (Cardiothoracic Vascular Surgery)

## 2013-11-05 ENCOUNTER — Ambulatory Visit (INDEPENDENT_AMBULATORY_CARE_PROVIDER_SITE_OTHER): Payer: Medicare Other | Admitting: Thoracic Surgery (Cardiothoracic Vascular Surgery)

## 2013-11-05 VITALS — BP 162/79 | HR 53 | Resp 16 | Ht 62.0 in | Wt 184.0 lb

## 2013-11-05 DIAGNOSIS — Z952 Presence of prosthetic heart valve: Secondary | ICD-10-CM

## 2013-11-05 DIAGNOSIS — I359 Nonrheumatic aortic valve disorder, unspecified: Secondary | ICD-10-CM

## 2013-11-05 DIAGNOSIS — Z9889 Other specified postprocedural states: Secondary | ICD-10-CM

## 2013-11-05 DIAGNOSIS — R911 Solitary pulmonary nodule: Secondary | ICD-10-CM

## 2013-11-05 DIAGNOSIS — I350 Nonrheumatic aortic (valve) stenosis: Secondary | ICD-10-CM

## 2013-11-05 DIAGNOSIS — I4891 Unspecified atrial fibrillation: Secondary | ICD-10-CM

## 2013-11-05 DIAGNOSIS — Z8679 Personal history of other diseases of the circulatory system: Secondary | ICD-10-CM

## 2013-11-05 DIAGNOSIS — Z953 Presence of xenogenic heart valve: Secondary | ICD-10-CM

## 2013-11-05 NOTE — Progress Notes (Signed)
[address]       [place] [address]             [phone]     CARDIOTHORACIC SURGERY OFFICE NOTE  Referring Provider is Lelon Perla, MD PCP is Walker Kehr, MD   HPI:  Patient returns for routine followup status post aortic valve replacement using a bioprosthetic tissue valve and Maze procedure on 04/12/2013. Her postoperative recovery was remarkably uncomplicated. She was last seen here in our office on 08/13/2013.  At that time she was doing well and maintaining sinus rhythm. She underwent followup CT scan of the chest because of a small groundglass opacity noted in the right upper lobe on preoperative CT scan, and the nodule remained stable.  Repeat annual follow up CT scan was recommended for 3 years.  In addition, amiodarone was stopped at the time of her last visit. She was seen in followup last week by Dr. [name] at which time she remained in sinus rhythm. She returns to our office for further followup today.  She reports to be doing quite well. She has stable symptoms of mild exertional shortness of breath which are notably considerably better than she was doing prior to surgery last fall. She's not having any exertional chest pain. She's not having any tachypalpitations or dizzy spells. Her only complaint is chronic "spitting" which has been a long-standing problem of unclear etiology.   Current Outpatient Prescriptions  Medication Sig Dispense Refill  . Alum & Mag Hydroxide-Simeth (MAGIC MOUTHWASH) SOLN Take 5 mLs by mouth 4 (four) times daily. Swish, hold and swallow  500 mL  1  . amoxicillin (AMOXIL) 500 MG tablet Take all four tablets one hour prior to procedure  4 tablet  6  . dapsone 25 MG tablet Take 25 mg by mouth daily.       . diphenhydrAMINE (BENADRYL) 50 MG capsule Take 1 capsule (50 mg total) by mouth every 6 (six) hours as needed for itching.  90 capsule  3  . furosemide (LASIX) 80 MG tablet TAKE 1 TABLET BY MOUTH TWICE A DAY  60  tablet  1  . glipiZIDE (GLUCOTROL XL) 10 MG 24 hr tablet Take 1 tablet (10 mg total) by mouth daily.  30 tablet  11  . glucose blood test strip Use as instructed qid ac and hs  100 each  12  . insulin lispro (HUMALOG) 100 UNIT/ML SOPN Inject 8 Units into the skin 4 (four) times daily -  before meals and at bedtime.  12 mL  11  . Insulin Pen Needle 31G X 5 MM MISC Test up to TID dx:250.00  100 each  11  . levothyroxine (LEVOTHROID) 25 MCG tablet Take 1 tablet (25 mcg total) by mouth daily.  30 tablet  11  . metoprolol tartrate (LOPRESSOR) 25 MG tablet TAKE 1 TABLET BY MOUTH TWICE A DAY  60 tablet  2  . mycophenolate (MYFORTIC) 180 MG EC tablet Take 360 mg by mouth 2 (two) times daily.       [name] DELICA LANCETS FINE MISC Test up to TID DX:250.00  100 each  11  . oxyCODONE (OXY IR/ROXICODONE) 5 MG immediate release tablet Take 1-2 tablets (5-10 mg total) by mouth every 6 (six) hours as needed.  120 tablet  0  . pravastatin (PRAVACHOL) 40 MG tablet TAKE 1 TABLET BY MOUTH AS DIRECTED  30 tablet  3  . tacrolimus (PROGRAF) 1 MG capsule Take 5  mg by mouth 2 (two) times daily. Take 5 capsules in the am, and 5 capsules in the pm.      . triamcinolone cream (KENALOG) 0.1 % Apply 1 application topically 2 (two) times daily.  60 g  3  . warfarin (COUMADIN) 5 MG tablet Take as directed by coumadin clinic  30 tablet  3   No current facility-administered medications for this visit.      Physical Exam:   BP 162/79  Pulse 53  Resp 16  Ht 5\' 2"  (1.575 m)  Wt 184 lb (83.462 kg)  BMI 33.65 kg/m2  SpO2 97%  General:  Well-appearing  Chest:   clear  CV:   Regular rate and rhythm without murmur  Incisions:  Completely healed, sternum is stable  Abdomen:  soft  Extremities:  warm  Diagnostic Tests:  2 channel telemetry rhythm strip demonstrates normal sinus rhythm   CT CHEST WITHOUT CONTRAST   TECHNIQUE: Multidetector CT imaging of the chest was performed following the standard protocol  without IV contrast.   COMPARISON:  CT CHEST W/O CM dated 04/07/2013; CT ABD/PELV WO CM dated 04/15/2013; CT ABD WO/W CM dated 11/21/2008; CT ABDOMEN W/O CM dated 09/10/2008   FINDINGS: No pathologically enlarged mediastinal or axillary lymph nodes. Hilar regions are difficult to definitively evaluate without IV contrast. Extensive atherosclerotic calcification of the arterial vasculature. Descending thoracic aorta measures up to 3.8 cm. Pulmonary arteries and heart are enlarged. No pericardial effusion.   Focal subpleural airspace consolidation in the inferior right lower lobe (image 47), new. Mosaic attenuation in the lungs bilaterally, similar to the prior exam. Somewhat focal area of nodular ground-glass in the apical segment right upper lobe measures 10 mm (image 14), stable. Scarring in the lingula. Pleural thickening in the posterior medial left hemi thorax. Trace right pleural fluid. Airway is unremarkable.   Incidental imaging of the upper abdomen shows a 1.5 x 2.2 cm low-attenuation lesion in the right adrenal gland, likely unchanged from 09/10/2008, when slight differences in measurement technique are considered. Right kidney is atrophic. 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 Extensive atherosclerotic calcification of the arterial vasculature. No worrisome lytic or sclerotic lesions. Degenerative changes are seen in the spine.   IMPRESSION: 1. Subpleural consolidation in the right lower lobe, likely infectious or inflammatory in etiology. Tiny adjacent right pleural effusion. 2. 10 mm right upper lobe ground-glass nodular lesion. Adenocarcinoma cannot be excluded. Follow up by CT is recommended in 12 months, with continued annual surveillance for a minimum of 3 years. These recommendations are taken from "Recommendations for the Management of Subsolid Pulmonary Nodules Detected at CT: A Statement from the [place]" Radiology 2013; 266:1, [phone]. 3. Mosaic pulmonary  parenchymal attenuation can be seen in the setting of small airways disease. Inspiratory and expiratory imaging would be confirmatory. 4. 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 5. Enlarged pulmonary arteries, indicative of pulmonary arterial hypertension.     Electronically Signed   By: Lorin Picket M.D.   On: 08/13/2013 14:18    Impression:  Patient is clinically doing very well more than 6 months status post aortic valve replacement and Maze procedure. She is maintaining sinus rhythm. Followup CT scan of the chest performed this past January demonstrates stable radiographic appearance of small groundglass opacity in the right upper lobe.  Plan:  The patient will return in 6 months for routine followup with rhythm check. We will plan repeat chest CT scan  in one year.   Valentina Gu. Roxy Manns, MD 11/05/2013 1:11 PM

## 2013-11-19 ENCOUNTER — Ambulatory Visit (INDEPENDENT_AMBULATORY_CARE_PROVIDER_SITE_OTHER): Payer: Medicare Other | Admitting: *Deleted

## 2013-11-19 DIAGNOSIS — I4891 Unspecified atrial fibrillation: Secondary | ICD-10-CM | POA: Diagnosis not present

## 2013-11-19 DIAGNOSIS — Z7901 Long term (current) use of anticoagulants: Secondary | ICD-10-CM

## 2013-11-19 DIAGNOSIS — Z79899 Other long term (current) drug therapy: Secondary | ICD-10-CM | POA: Diagnosis not present

## 2013-11-19 DIAGNOSIS — D631 Anemia in chronic kidney disease: Secondary | ICD-10-CM | POA: Diagnosis not present

## 2013-11-19 DIAGNOSIS — Z5181 Encounter for therapeutic drug level monitoring: Secondary | ICD-10-CM | POA: Diagnosis not present

## 2013-11-19 LAB — POCT INR: INR: 1.8

## 2013-11-29 ENCOUNTER — Ambulatory Visit (INDEPENDENT_AMBULATORY_CARE_PROVIDER_SITE_OTHER): Payer: Medicare Other

## 2013-11-29 DIAGNOSIS — I4891 Unspecified atrial fibrillation: Secondary | ICD-10-CM

## 2013-11-29 DIAGNOSIS — Z7901 Long term (current) use of anticoagulants: Secondary | ICD-10-CM | POA: Diagnosis not present

## 2013-11-29 DIAGNOSIS — Z5181 Encounter for therapeutic drug level monitoring: Secondary | ICD-10-CM

## 2013-11-29 LAB — POCT INR: INR: 2.8

## 2013-12-03 ENCOUNTER — Other Ambulatory Visit: Payer: Self-pay | Admitting: *Deleted

## 2013-12-03 MED ORDER — METOPROLOL TARTRATE 25 MG PO TABS: ORAL_TABLET | ORAL | Status: DC

## 2013-12-17 ENCOUNTER — Other Ambulatory Visit: Payer: Self-pay | Admitting: Internal Medicine

## 2013-12-19 ENCOUNTER — Ambulatory Visit (INDEPENDENT_AMBULATORY_CARE_PROVIDER_SITE_OTHER): Payer: Medicare Other | Admitting: Pharmacist

## 2013-12-19 DIAGNOSIS — I4891 Unspecified atrial fibrillation: Secondary | ICD-10-CM

## 2013-12-19 DIAGNOSIS — Z7901 Long term (current) use of anticoagulants: Secondary | ICD-10-CM

## 2013-12-19 DIAGNOSIS — Z5181 Encounter for therapeutic drug level monitoring: Secondary | ICD-10-CM | POA: Diagnosis not present

## 2013-12-19 LAB — POCT INR: INR: 2.2

## 2013-12-21 DIAGNOSIS — Z940 Kidney transplant status: Secondary | ICD-10-CM | POA: Diagnosis not present

## 2013-12-21 DIAGNOSIS — Z79899 Other long term (current) drug therapy: Secondary | ICD-10-CM | POA: Diagnosis not present

## 2013-12-21 DIAGNOSIS — N2581 Secondary hyperparathyroidism of renal origin: Secondary | ICD-10-CM | POA: Diagnosis not present

## 2013-12-21 DIAGNOSIS — D631 Anemia in chronic kidney disease: Secondary | ICD-10-CM | POA: Diagnosis not present

## 2013-12-25 ENCOUNTER — Other Ambulatory Visit: Payer: Self-pay | Admitting: Internal Medicine

## 2013-12-25 DIAGNOSIS — N184 Chronic kidney disease, stage 4 (severe): Secondary | ICD-10-CM | POA: Diagnosis not present

## 2013-12-25 DIAGNOSIS — R1310 Dysphagia, unspecified: Secondary | ICD-10-CM | POA: Diagnosis not present

## 2013-12-25 DIAGNOSIS — Z940 Kidney transplant status: Secondary | ICD-10-CM | POA: Diagnosis not present

## 2013-12-25 DIAGNOSIS — I10 Essential (primary) hypertension: Secondary | ICD-10-CM | POA: Diagnosis not present

## 2013-12-31 ENCOUNTER — Telehealth: Payer: Self-pay | Admitting: Internal Medicine

## 2013-12-31 NOTE — Telephone Encounter (Signed)
Pt states she has been having problems with liquid coming up in her throat causing her to have lots of spitting. States ENT doc told her to follow-up with Dr. Henrene Pastor. Pt scheduled to see Dr. Henrene Pastor 02/04/14 @10 :15am. Pt aware of appt.

## 2014-01-09 ENCOUNTER — Encounter: Payer: Self-pay | Admitting: Internal Medicine

## 2014-01-09 ENCOUNTER — Ambulatory Visit (INDEPENDENT_AMBULATORY_CARE_PROVIDER_SITE_OTHER): Payer: Medicare Other | Admitting: Internal Medicine

## 2014-01-09 ENCOUNTER — Ambulatory Visit (INDEPENDENT_AMBULATORY_CARE_PROVIDER_SITE_OTHER): Payer: Medicare Other | Admitting: *Deleted

## 2014-01-09 VITALS — BP 155/80 | HR 80 | Temp 98.5°F | Resp 16 | Wt 190.0 lb

## 2014-01-09 DIAGNOSIS — I2510 Atherosclerotic heart disease of native coronary artery without angina pectoris: Secondary | ICD-10-CM

## 2014-01-09 DIAGNOSIS — I4891 Unspecified atrial fibrillation: Secondary | ICD-10-CM

## 2014-01-09 DIAGNOSIS — Z7901 Long term (current) use of anticoagulants: Secondary | ICD-10-CM

## 2014-01-09 DIAGNOSIS — M545 Low back pain, unspecified: Secondary | ICD-10-CM | POA: Diagnosis not present

## 2014-01-09 DIAGNOSIS — D649 Anemia, unspecified: Secondary | ICD-10-CM | POA: Diagnosis not present

## 2014-01-09 DIAGNOSIS — N186 End stage renal disease: Secondary | ICD-10-CM

## 2014-01-09 DIAGNOSIS — Z5181 Encounter for therapeutic drug level monitoring: Secondary | ICD-10-CM | POA: Diagnosis not present

## 2014-01-09 DIAGNOSIS — I6350 Cerebral infarction due to unspecified occlusion or stenosis of unspecified cerebral artery: Secondary | ICD-10-CM

## 2014-01-09 DIAGNOSIS — R209 Unspecified disturbances of skin sensation: Secondary | ICD-10-CM

## 2014-01-09 DIAGNOSIS — R202 Paresthesia of skin: Secondary | ICD-10-CM

## 2014-01-09 DIAGNOSIS — E1149 Type 2 diabetes mellitus with other diabetic neurological complication: Secondary | ICD-10-CM

## 2014-01-09 DIAGNOSIS — M109 Gout, unspecified: Secondary | ICD-10-CM

## 2014-01-09 LAB — CBC WITH DIFFERENTIAL/PLATELET
BASOS PCT: 0.1 % (ref 0.0–3.0)
Basophils Absolute: 0 10*3/uL (ref 0.0–0.1)
EOS ABS: 0.1 10*3/uL (ref 0.0–0.7)
Eosinophils Relative: 2.2 % (ref 0.0–5.0)
HCT: 37.1 % (ref 36.0–46.0)
Hemoglobin: 11.8 g/dL — ABNORMAL LOW (ref 12.0–15.0)
Lymphocytes Relative: 13.1 % (ref 12.0–46.0)
Lymphs Abs: 0.8 10*3/uL (ref 0.7–4.0)
MCHC: 31.9 g/dL (ref 30.0–36.0)
MCV: 81.6 fl (ref 78.0–100.0)
MONO ABS: 0.9 10*3/uL (ref 0.1–1.0)
Monocytes Relative: 13.3 % — ABNORMAL HIGH (ref 3.0–12.0)
NEUTROS ABS: 4.6 10*3/uL (ref 1.4–7.7)
NEUTROS PCT: 71.3 % (ref 43.0–77.0)
Platelets: 213 10*3/uL (ref 150.0–400.0)
RBC: 4.55 Mil/uL (ref 3.87–5.11)
RDW: 16.3 % — AB (ref 11.5–15.5)
WBC: 6.4 10*3/uL (ref 4.0–10.5)

## 2014-01-09 LAB — BASIC METABOLIC PANEL
BUN: 31 mg/dL — AB (ref 6–23)
CO2: 30 mEq/L (ref 19–32)
CREATININE: 2 mg/dL — AB (ref 0.4–1.2)
Calcium: 10.2 mg/dL (ref 8.4–10.5)
Chloride: 104 mEq/L (ref 96–112)
GFR: 32.24 mL/min — AB (ref >60.00)
Glucose, Bld: 106 mg/dL — ABNORMAL HIGH (ref 70–99)
POTASSIUM: 3.6 meq/L (ref 3.5–5.1)
Sodium: 141 mEq/L (ref 135–145)

## 2014-01-09 LAB — HEPATIC FUNCTION PANEL
ALT: 24 U/L (ref 0–35)
AST: 23 U/L (ref 0–37)
Albumin: 4 g/dL (ref 3.5–5.2)
Alkaline Phosphatase: 80 U/L (ref 39–117)
BILIRUBIN DIRECT: 0.1 mg/dL (ref 0.0–0.3)
Total Bilirubin: 0.4 mg/dL (ref 0.2–1.2)
Total Protein: 7.4 g/dL (ref 6.0–8.3)

## 2014-01-09 LAB — TSH: TSH: 5.47 u[IU]/mL — AB (ref 0.35–4.50)

## 2014-01-09 LAB — VITAMIN B12: Vitamin B-12: 688 pg/mL (ref 211–911)

## 2014-01-09 LAB — POCT INR: INR: 2.7

## 2014-01-09 LAB — HEMOGLOBIN A1C: HEMOGLOBIN A1C: 8.3 % — AB (ref 4.6–6.5)

## 2014-01-09 LAB — SEDIMENTATION RATE: Sed Rate: 32 mm/hr — ABNORMAL HIGH (ref 0–22)

## 2014-01-09 MED ORDER — GABAPENTIN 100 MG PO CAPS: 100.0000 mg | ORAL_CAPSULE | Freq: Three times a day (TID) | ORAL | Status: DC | PRN

## 2014-01-09 MED ORDER — OXYCODONE HCL 5 MG PO TABS: 5.0000 mg | ORAL_TABLET | Freq: Four times a day (QID) | ORAL | Status: DC | PRN

## 2014-01-09 NOTE — Assessment & Plan Note (Signed)
No relapse 

## 2014-01-09 NOTE — Assessment & Plan Note (Signed)
Continue with current prescription therapy as reflected on the Med list.  

## 2014-01-09 NOTE — Progress Notes (Signed)
Pre visit review using our clinic review tool, if applicable. No additional management support is needed unless otherwise documented below in the visit note. 

## 2014-01-09 NOTE — Assessment & Plan Note (Signed)
Labs  Continue with current prescription therapy as reflected on the Med list.  

## 2014-01-09 NOTE — Assessment & Plan Note (Signed)
Continue with current prescription therapy as reflected on the Med list. F/u w/Nephrol Cr 1.6

## 2014-01-09 NOTE — Patient Instructions (Signed)
Wt Readings from Last 3 Encounters:  01/09/14 190 lb (86.183 kg)  11/05/13 184 lb (83.462 kg)  10/29/13 185 lb (83.915 kg)

## 2014-01-09 NOTE — Progress Notes (Signed)
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  Pt with severe aortic stenosis now s/p AVR on 04/11/13.  F/u elev CBG: 200s ac and 300s pc: using Insulin tid - better CBGs. No low CBGs  Novolog pen is not covered, Humalog is covered   HPI  The patient presents for a follow-up of  Chronic ESRD, hypertension, chronic dyslipidemia, type 2 diabetes not controlled with medicines. She just had a renal tranplant in 8/12. F/u itching and rash - not better.    BP Readings from Last 3 Encounters:  01/09/14 155/80  11/05/13 162/79  10/29/13 147/79   Wt Readings from Last 3 Encounters:  01/09/14 190 lb (86.183 kg)  11/05/13 184 lb (83.462 kg)  10/29/13 185 lb (83.915 kg)     Review of Systems  Constitutional: Negative for chills, activity change, appetite change and unexpected weight change.  HENT: Negative for mouth sores and sinus pressure.   Eyes: Negative for visual disturbance.  Respiratory: Negative for chest tightness.   Gastrointestinal: Negative for nausea and abdominal pain.  Genitourinary: Negative for frequency, difficulty urinating and vaginal pain.  Musculoskeletal: Positive for arthralgias and gait problem. Negative for back pain.  Skin: Positive for color change and rash.       pruritis  Neurological: Positive for numbness.  Psychiatric/Behavioral: Negative for sleep disturbance.       Objective:   Physical Exam  Constitutional: No distress.  Looks much better  HENT:  Head: Normocephalic.  Right Ear: External ear normal.  Left Ear: External ear normal.  Nose: Nose normal.  Mouth/Throat: Oropharynx is clear and moist.  Eyes: Conjunctivae are normal. Pupils are equal, round, and reactive to light. Right eye exhibits no discharge. Left eye  exhibits no discharge.  Neck: Normal range of motion. Neck supple. No JVD present. No tracheal deviation present. No thyromegaly present.  Cardiovascular: Normal rate and regular rhythm.  Exam reveals no gallop and no friction rub.   Murmur (2/6) heard. Pulmonary/Chest: No stridor. No respiratory distress. She has no wheezes.  Abdominal: Soft. Bowel sounds are normal. She exhibits no distension and no mass. There is no tenderness. There is no rebound and no guarding.  Musculoskeletal: She exhibits no edema and no tenderness.  Walking w/a cane  Lymphadenopathy:    She has no cervical adenopathy.  Neurological: She displays normal reflexes. No cranial nerve deficit. She exhibits normal muscle tone. Coordination normal.  Skin: Rash (hives and hyperpigmentation on LE and UEs B) noted. No erythema.  Scar on chest and   Psychiatric: She has a normal mood and affect. Her behavior is normal. Thought content normal.   Graft in L arm seems to be ok  Lab Results  Component Value Date   WBC 13.9* 04/22/2013   HGB 9.6* 04/22/2013   HCT 26.2* 04/22/2013   PLT 316 04/22/2013   GLUCOSE 216* 07/10/2013   CHOL 164 04/06/2013   TRIG 125 04/06/2013   HDL 73 04/06/2013   LDLDIRECT 89.3 06/13/2006   LDLCALC 66 04/06/2013   ALT 45* 07/10/2013   AST 30 07/10/2013   NA 138 07/10/2013   K 4.7 07/10/2013   CL 105 07/10/2013   CREATININE 1.8* 07/10/2013   BUN 33* 07/10/2013   CO2 26  07/10/2013   TSH 13.22* 07/10/2013   INR 2.7 01/09/2014   HGBA1C 8.3* 07/10/2013   MICROALBUR 168.6* 10/31/2006       Assessment & Plan:

## 2014-01-09 NOTE — Assessment & Plan Note (Signed)
CBC

## 2014-01-10 MED ORDER — LEVOTHYROXINE SODIUM 25 MCG PO TABS: 50.0000 ug | ORAL_TABLET | Freq: Every day | ORAL | Status: DC

## 2014-01-21 ENCOUNTER — Telehealth: Payer: Self-pay

## 2014-01-21 DIAGNOSIS — E119 Type 2 diabetes mellitus without complications: Secondary | ICD-10-CM

## 2014-01-21 NOTE — Telephone Encounter (Signed)
Diabetic bundle- a1c and bmet ordered

## 2014-01-30 DIAGNOSIS — Z940 Kidney transplant status: Secondary | ICD-10-CM | POA: Diagnosis not present

## 2014-01-30 DIAGNOSIS — Z79899 Other long term (current) drug therapy: Secondary | ICD-10-CM | POA: Diagnosis not present

## 2014-01-30 DIAGNOSIS — D631 Anemia in chronic kidney disease: Secondary | ICD-10-CM | POA: Diagnosis not present

## 2014-02-04 ENCOUNTER — Ambulatory Visit (INDEPENDENT_AMBULATORY_CARE_PROVIDER_SITE_OTHER): Payer: Medicare Other | Admitting: Surgery

## 2014-02-04 ENCOUNTER — Ambulatory Visit: Payer: Medicare Other | Admitting: Internal Medicine

## 2014-02-04 ENCOUNTER — Encounter: Payer: Self-pay | Admitting: Internal Medicine

## 2014-02-04 VITALS — BP 114/60 | HR 60 | Ht 61.5 in | Wt 188.1 lb

## 2014-02-04 DIAGNOSIS — Z86010 Personal history of colonic polyps: Secondary | ICD-10-CM

## 2014-02-04 DIAGNOSIS — F428 Other obsessive-compulsive disorder: Secondary | ICD-10-CM

## 2014-02-04 DIAGNOSIS — I4891 Unspecified atrial fibrillation: Secondary | ICD-10-CM | POA: Diagnosis not present

## 2014-02-04 DIAGNOSIS — K219 Gastro-esophageal reflux disease without esophagitis: Secondary | ICD-10-CM

## 2014-02-04 DIAGNOSIS — Z7901 Long term (current) use of anticoagulants: Secondary | ICD-10-CM

## 2014-02-04 DIAGNOSIS — Z5181 Encounter for therapeutic drug level monitoring: Secondary | ICD-10-CM | POA: Diagnosis not present

## 2014-02-04 LAB — POCT INR: INR: 2

## 2014-02-04 MED ORDER — OMEPRAZOLE 40 MG PO CPDR: 40.0000 mg | DELAYED_RELEASE_CAPSULE | Freq: Every day | ORAL | Status: DC

## 2014-02-04 NOTE — Progress Notes (Signed)
HISTORY OF PRESENT ILLNESS:  [name] is a 68 y.o. female with multiple significant medical problems as listed below including prior kidney transplant and coronary artery bypass grafting. She is sent today by her nephrologist regarding "spitting up". She was told that this is reflux Patient was evaluated in 2011 for a habitual spitting and the need for surveillance colonoscopy. See that dictation for details. She is accompanied today by her husband. During the interview she repeatedly spits in tissue. She has had evaluations previously including barium swallow, gastric emptying scan, upper endoscopy, modified barium swallow with speech pathology, as well as empiric therapies including Mucinex and PPI. All studies have been unrevealing. Empiric therapies unhelpful. She does state that med mouthwash helps a little. Her last colonoscopy in November of 2011 revealed diverticulosis and diminutive adenomas which were removed. Followup in 5 years recommended. GI review of systems is otherwise negative. She is not taking PPI  REVIEW OF SYSTEMS:  All non-GI ROS negative except for skin rash  Past Medical History  Diagnosis Date  . Coronary artery disease 05/2002    a. Ant MI 2003 s/p PTCA/stent to RCA.   Marland Kitchen Hypertension   . Hyperlipidemia   . CHF (congestive heart failure)   . Pericardial effusion     a. Small by echo 11/2011.  Marland Kitchen GERD (gastroesophageal reflux disease)   . 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 Unspecified hearing loss   . Anemia, iron deficiency     of chronic disease  . Helicobacter pylori (H. pylori) infection     hx of  . Esophagitis, reflux   . Gout   . Cholelithiasis   . Hx of colonic polyps     adenomatous  . Diverticulosis of colon   . 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  Marland Kitchen Chronic  cough onset 03/2010    Dr Melvyn Novas  . Carotid artery disease     a. Carotid Dopplers performed in August 2013 showed 40-59% left stenosis and 0-39% right; f/u recommended in 2 years.   . Aortic stenosis     a. Severe AS by echo 11/2012.  . Asystole     a. During ENT surgery 2005: developed marked asystole requiring CPR, felt due to vagal reaction (cath nonobst dz).  . Myocardial infarction 2003  . Cerebrovascular accident 2009    a. LMCA infarct felt embolic 0981, maintained on chronic coumadin.; denies residual on 04/05/2013  . Sleep apnea     Pt says testing was positive, intolerant of CPAP.  Marland Kitchen Type II diabetes mellitus   . ESRD (end stage renal disease)     a. Mass on L kidney per pt s/p nephrectomy - pt states not cancer - [facility] notes indicate ESRD due to HTN/DM - was previously on HD. b. Kidney transplant 02/2011.  . S/P kidney transplant 03/16/2011  . Chronic Persistent Atrial Fibrillation 12/31/2008    Qualifier: Diagnosis of  By: [name]    . Nodule of right lung 04/07/2013    Ground glass opacity right lung  . S/P aortic valve replacement with bioprosthetic valve and maze procedure 04/12/2013    49mm [facility] Ease bovine pericardial tissue valve   . S/P Maze operation for atrial fibrillation 04/12/2013    Complete bilateral atrial lesion set using bipolar radiofrequency and cryothermy ablation with clipping of LA  appendage  . Pruritic dermatitis     treated with steriods/UV light  . Lung nodule seen on imaging study 04/07/2013    1.0 cm ground glass opacity RUL  . Hemorrhoids     Past Surgical History  Procedure Laterality Date  . Cholecystectomy  2009    with hernia removal  . Arteriovenous graft placement Left   . Av fistula placement Right   . Nasal reconstruction with septal repair      "took it out" (04/05/2013)  . Tubal ligation    . Nephrectomy Left 2010    no CA on bx  . Kidney transplant  03/16/11  . Cardioversion  05/29/2012    Procedure: CARDIOVERSION;   Surgeon: Lelon Perla, MD;  Location[facility] ENDOSCOPY;  Service: Cardiovascular;  Laterality: N/A;  . Coronary angioplasty with stent placement Right     coronary artery  . Tonsillectomy    . Total abdominal hysterectomy    . Arteriovenous graft placement Left     "I've had 2 on my left; had one removed" (04/05/2013)   . Av fistula repair Right     "took it out" ((/18/2014)  . Insertion of dialysis catheter Bilateral     "over the years; took them both out" (04/05/2013)  . Intraoperative transesophageal echocardiogram N/A 04/11/2013    Procedure: INTRAOPERATIVE TRANSESOPHAGEAL ECHOCARDIOGRAM;  Surgeon: Rexene Alberts, MD;  Location: [place];  Service: Open Heart Surgery;  Laterality: N/A;  . Artery exploration Right 04/11/2013    Procedure: ARTERY EXPLORATION;  Surgeon: Rexene Alberts, MD;  Location: [place];  Service: Open Heart Surgery;  Laterality: Right;  Right carotid artery exploration  . Aortic valve replacement N/A 04/12/2013    Procedure: AORTIC VALVE REPLACEMENT (AVR);  Surgeon: Rexene Alberts, MD;  Location: [place];  Service: Open Heart Surgery;  Laterality: N/A;  . Maze N/A 04/12/2013    Procedure: MAZE;  Surgeon: Rexene Alberts, MD;  Location: [place];  Service: Open Heart Surgery;  Laterality: N/A;  . Intraoperative transesophageal echocardiogram N/A 04/12/2013    Procedure: INTRAOPERATIVE TRANSESOPHAGEAL ECHOCARDIOGRAM;  Surgeon: Rexene Alberts, MD;  Location: [place];  Service: Open Heart Surgery;  Laterality: N/A;    Social History Shatasha A Mckinnie  reports that she quit smoking about 12 years ago. Her smoking use included Cigarettes. She has a 30 pack-year smoking history. She has never used smokeless tobacco. She reports that she does not drink alcohol or use illicit drugs.  family history includes Diabetes in her other; Hypertension in her mother; Stroke in her father.  Allergies  Allergen Reactions  . Ibuprofen Nausea And Vomiting  . Sulfamethoxazole-Trimethoprim Swelling  .  Sulfonamide Derivatives Swelling  . Tape Rash  . Tramadol Nausea And Vomiting  . Bactrim Swelling  . Red Dye Itching and Rash       PHYSICAL EXAMINATION: Vital signs: BP 114/60  Pulse 60  Ht 5' 1.5" (1.562 m)  Wt 188 lb 2 oz (85.333 kg)  BMI 34.97 kg/m2 General: Well-developed, well-nourished, no acute distress HEENT: Sclerae are anicteric, conjunctiva pink. Oral mucosa intact. No thrush Lungs: Clear Heart: Regular Abdomen: soft, nontender, nondistended, no obvious ascites, no peritoneal signs, normal bowel sounds. No organomegaly. Extremities: No edema Psychiatric: alert and oriented x3. Cooperative Neuro: Normal gag reflex   ASSESSMENT:  #1. Habitual spitting. Not a primary GI condition #2. History of adenomatous colon polyps. Last colonoscopy November 2011 #3. Multiple significant medical problems   PLAN:  #1. Consider behavioral medicine  evaluation. PCP can decide #2. Empiric trial of omeprazole 40 mg daily. Unlikely to help, but the patient did want to try that she was told that her problem is reflux. Let her try for one to 2 months #3. Surveillance colonoscopy around November 2016 #4. Ongoing general medical care with PCP and other specialists

## 2014-02-04 NOTE — Patient Instructions (Signed)
We have sent the following medications to your pharmacy for you to pick up at your convenience:  Omeprazole  

## 2014-02-17 ENCOUNTER — Other Ambulatory Visit: Payer: Self-pay | Admitting: Internal Medicine

## 2014-02-20 ENCOUNTER — Other Ambulatory Visit: Payer: Self-pay

## 2014-02-20 DIAGNOSIS — E1149 Type 2 diabetes mellitus with other diabetic neurological complication: Secondary | ICD-10-CM

## 2014-02-20 MED ORDER — ONETOUCH DELICA LANCETS FINE MISC: Status: DC

## 2014-03-01 ENCOUNTER — Other Ambulatory Visit: Payer: Self-pay

## 2014-03-01 DIAGNOSIS — Z1231 Encounter for screening mammogram for malignant neoplasm of breast: Secondary | ICD-10-CM

## 2014-03-04 ENCOUNTER — Ambulatory Visit (INDEPENDENT_AMBULATORY_CARE_PROVIDER_SITE_OTHER): Payer: Medicare Other | Admitting: *Deleted

## 2014-03-04 DIAGNOSIS — I4891 Unspecified atrial fibrillation: Secondary | ICD-10-CM | POA: Diagnosis not present

## 2014-03-04 DIAGNOSIS — Z5181 Encounter for therapeutic drug level monitoring: Secondary | ICD-10-CM

## 2014-03-04 DIAGNOSIS — Z7901 Long term (current) use of anticoagulants: Secondary | ICD-10-CM

## 2014-03-04 LAB — POCT INR: INR: 2.2

## 2014-03-13 DIAGNOSIS — Z940 Kidney transplant status: Secondary | ICD-10-CM | POA: Diagnosis not present

## 2014-03-13 DIAGNOSIS — D899 Disorder involving the immune mechanism, unspecified: Secondary | ICD-10-CM | POA: Diagnosis not present

## 2014-03-13 DIAGNOSIS — I10 Essential (primary) hypertension: Secondary | ICD-10-CM | POA: Diagnosis not present

## 2014-03-13 DIAGNOSIS — E1169 Type 2 diabetes mellitus with other specified complication: Secondary | ICD-10-CM | POA: Diagnosis not present

## 2014-03-13 DIAGNOSIS — Z48298 Encounter for aftercare following other organ transplant: Secondary | ICD-10-CM | POA: Diagnosis not present

## 2014-03-21 DIAGNOSIS — Z940 Kidney transplant status: Secondary | ICD-10-CM | POA: Diagnosis not present

## 2014-03-27 ENCOUNTER — Other Ambulatory Visit: Payer: Self-pay | Admitting: *Deleted

## 2014-03-27 MED ORDER — WARFARIN SODIUM 5 MG PO TABS: ORAL_TABLET | ORAL | Status: DC

## 2014-03-28 ENCOUNTER — Ambulatory Visit: Payer: Medicare Other

## 2014-04-03 ENCOUNTER — Other Ambulatory Visit (HOSPITAL_COMMUNITY): Payer: Self-pay | Admitting: Cardiology

## 2014-04-03 ENCOUNTER — Ambulatory Visit
Admission: RE | Admit: 2014-04-03 | Discharge: 2014-04-03 | Disposition: A | Discharge status: Home / Self Care | Payer: Medicare Other | Source: Ambulatory Visit

## 2014-04-03 DIAGNOSIS — I6529 Occlusion and stenosis of unspecified carotid artery: Secondary | ICD-10-CM

## 2014-04-03 DIAGNOSIS — Z1231 Encounter for screening mammogram for malignant neoplasm of breast: Secondary | ICD-10-CM | POA: Diagnosis not present

## 2014-04-08 ENCOUNTER — Ambulatory Visit (HOSPITAL_COMMUNITY): Payer: Medicare Other | Attending: Cardiovascular Disease | Admitting: Cardiology

## 2014-04-08 DIAGNOSIS — I10 Essential (primary) hypertension: Secondary | ICD-10-CM | POA: Diagnosis not present

## 2014-04-08 DIAGNOSIS — E785 Hyperlipidemia, unspecified: Secondary | ICD-10-CM | POA: Diagnosis not present

## 2014-04-08 DIAGNOSIS — E119 Type 2 diabetes mellitus without complications: Secondary | ICD-10-CM | POA: Insufficient documentation

## 2014-04-08 DIAGNOSIS — Z87891 Personal history of nicotine dependence: Secondary | ICD-10-CM | POA: Insufficient documentation

## 2014-04-08 DIAGNOSIS — I2510 Atherosclerotic heart disease of native coronary artery without angina pectoris: Secondary | ICD-10-CM | POA: Insufficient documentation

## 2014-04-08 DIAGNOSIS — I6529 Occlusion and stenosis of unspecified carotid artery: Secondary | ICD-10-CM | POA: Insufficient documentation

## 2014-04-08 NOTE — Progress Notes (Signed)
Carotid duplex performed 

## 2014-04-10 ENCOUNTER — Other Ambulatory Visit: Payer: Self-pay | Admitting: *Deleted

## 2014-04-10 DIAGNOSIS — R9439 Abnormal result of other cardiovascular function study: Secondary | ICD-10-CM

## 2014-04-10 LAB — HM MAMMOGRAPHY

## 2014-04-12 ENCOUNTER — Other Ambulatory Visit (INDEPENDENT_AMBULATORY_CARE_PROVIDER_SITE_OTHER): Payer: Medicare Other

## 2014-04-12 ENCOUNTER — Encounter: Payer: Self-pay | Admitting: Internal Medicine

## 2014-04-12 ENCOUNTER — Ambulatory Visit (INDEPENDENT_AMBULATORY_CARE_PROVIDER_SITE_OTHER): Payer: Medicare Other | Admitting: Internal Medicine

## 2014-04-12 ENCOUNTER — Ambulatory Visit (INDEPENDENT_AMBULATORY_CARE_PROVIDER_SITE_OTHER): Payer: Medicare Other | Admitting: Pharmacist

## 2014-04-12 VITALS — BP 120/60 | HR 57 | Temp 98.6°F | Wt 191.0 lb

## 2014-04-12 DIAGNOSIS — M79671 Pain in right foot: Secondary | ICD-10-CM | POA: Insufficient documentation

## 2014-04-12 DIAGNOSIS — I10 Essential (primary) hypertension: Secondary | ICD-10-CM | POA: Diagnosis not present

## 2014-04-12 DIAGNOSIS — E119 Type 2 diabetes mellitus without complications: Secondary | ICD-10-CM

## 2014-04-12 DIAGNOSIS — N186 End stage renal disease: Secondary | ICD-10-CM | POA: Diagnosis not present

## 2014-04-12 DIAGNOSIS — M545 Low back pain, unspecified: Secondary | ICD-10-CM

## 2014-04-12 DIAGNOSIS — Z5181 Encounter for therapeutic drug level monitoring: Secondary | ICD-10-CM | POA: Diagnosis not present

## 2014-04-12 DIAGNOSIS — M79609 Pain in unspecified limb: Secondary | ICD-10-CM

## 2014-04-12 DIAGNOSIS — Z7901 Long term (current) use of anticoagulants: Secondary | ICD-10-CM

## 2014-04-12 DIAGNOSIS — I4891 Unspecified atrial fibrillation: Secondary | ICD-10-CM | POA: Diagnosis not present

## 2014-04-12 DIAGNOSIS — E1149 Type 2 diabetes mellitus with other diabetic neurological complication: Secondary | ICD-10-CM

## 2014-04-12 DIAGNOSIS — I2510 Atherosclerotic heart disease of native coronary artery without angina pectoris: Secondary | ICD-10-CM | POA: Diagnosis not present

## 2014-04-12 DIAGNOSIS — M109 Gout, unspecified: Secondary | ICD-10-CM

## 2014-04-12 DIAGNOSIS — K2100 Gastro-esophageal reflux disease with esophagitis, without bleeding: Secondary | ICD-10-CM | POA: Diagnosis not present

## 2014-04-12 DIAGNOSIS — M79672 Pain in left foot: Secondary | ICD-10-CM

## 2014-04-12 LAB — BASIC METABOLIC PANEL
BUN: 27 mg/dL — ABNORMAL HIGH (ref 6–23)
CALCIUM: 10.3 mg/dL (ref 8.4–10.5)
CO2: 30 mEq/L (ref 19–32)
Chloride: 103 mEq/L (ref 96–112)
Creatinine, Ser: 1.6 mg/dL — ABNORMAL HIGH (ref 0.4–1.2)
GFR: 41.25 mL/min — AB (ref >60.00)
GLUCOSE: 200 mg/dL — AB (ref 70–99)
Potassium: 3.7 mEq/L (ref 3.5–5.1)
Sodium: 138 mEq/L (ref 135–145)

## 2014-04-12 LAB — HEMOGLOBIN A1C: Hgb A1c MFr Bld: 7.9 % — ABNORMAL HIGH (ref 4.6–6.5)

## 2014-04-12 LAB — POCT INR: INR: 1.6

## 2014-04-12 MED ORDER — OXYCODONE HCL 5 MG PO TABS: 5.0000 mg | ORAL_TABLET | Freq: Four times a day (QID) | ORAL | Status: DC | PRN

## 2014-04-12 NOTE — Assessment & Plan Note (Signed)
Continue with current prescription therapy as reflected on the Med list.  

## 2014-04-12 NOTE — Assessment & Plan Note (Signed)
Chronic 2012  Potential benefits of a long term oxycodone use as well as potential risks (i.e. addiction risk (low), apnea etc) and complications (i.e. Somnolence, seizures, constipation and others) were explained to the patient and were aknowledged.  Oxycodone prn

## 2014-04-12 NOTE — Progress Notes (Signed)
Subjective:   F/u B feet burning - not worse C/o foot pain under toes  Ms. Selley is a 68 y/o F with history of CAD s/p MI/RCA stent 2005, aortic stenosis, embolic CVA 7062, PAF on Coumadin, DM, pericardial effusion, and CKD (ESRD due to HTN/DM -> HD then renal transplant 2012 followed at [facility]) .  Pt with severe aortic stenosis now s/p AVR on 04/11/13.  F/u elev CBG: 200s ac and 300s pc: using Insulin tid - better CBGs. No low CBGs  Novolog pen is not covered, Humalog is covered   HPI  The patient presents for a follow-up of  Chronic ESRD, hypertension, chronic dyslipidemia, type 2 diabetes not controlled with medicines. She just had a renal tranplant in 8/12. F/u itching and rash - not better.   Declined a flu shot     BP Readings from Last 3 Encounters:  04/12/14 120/60  02/04/14 114/60  01/09/14 155/80   Wt Readings from Last 3 Encounters:  04/12/14 191 lb (86.637 kg)  02/04/14 188 lb 2 oz (85.333 kg)  01/09/14 190 lb (86.183 kg)     Review of Systems  Constitutional: Negative for chills, activity change, appetite change and unexpected weight change.  HENT: Negative for mouth sores and sinus pressure.   Eyes: Negative for visual disturbance.  Respiratory: Negative for chest tightness.   Gastrointestinal: Negative for nausea and abdominal pain.  Genitourinary: Negative for frequency, difficulty urinating and vaginal pain.  Musculoskeletal: Positive for arthralgias and gait problem. Negative for back pain.  Skin: Positive for color change and rash.       pruritis  Neurological: Positive for numbness.  Psychiatric/Behavioral: Negative for sleep disturbance.       Objective:   Physical Exam  Constitutional: No distress.  Looks much better  HENT:  Head: Normocephalic.  Right Ear: External ear normal.  Left Ear: External ear normal.  Nose: Nose normal.  Mouth/Throat: Oropharynx is clear and moist.  Eyes: Conjunctivae are normal. Pupils are equal, round, and  reactive to light. Right eye exhibits no discharge. Left eye exhibits no discharge.  Neck: Normal range of motion. Neck supple. No JVD present. No tracheal deviation present. No thyromegaly present.  Cardiovascular: Normal rate and regular rhythm.  Exam reveals no gallop and no friction rub.   Murmur (2/6) heard. Pulmonary/Chest: No stridor. No respiratory distress. She has no wheezes.  Abdominal: Soft. Bowel sounds are normal. She exhibits no distension and no mass. There is no tenderness. There is no rebound and no guarding.  Musculoskeletal: She exhibits no edema and no tenderness.  Walking w/a cane  Lymphadenopathy:    She has no cervical adenopathy.  Neurological: She displays normal reflexes. No cranial nerve deficit. She exhibits normal muscle tone. Coordination normal.  Skin: Rash (hives and hyperpigmentation on LE and UEs B) noted. No erythema.  Scar on chest and   Psychiatric: She has a normal mood and affect. Her behavior is normal. Thought content normal.  Distal feet are tender B Graft in L arm seems to be ok  Lab Results  Component Value Date   WBC 6.4 01/09/2014   HGB 11.8* 01/09/2014   HCT 37.1 01/09/2014   PLT 213.0 01/09/2014   GLUCOSE 106* 01/09/2014   CHOL 164 04/06/2013   TRIG 125 04/06/2013   HDL 73 04/06/2013   LDLDIRECT 89.3 06/13/2006   LDLCALC 66 04/06/2013   ALT 24 01/09/2014   AST 23 01/09/2014   NA 141 01/09/2014   K 3.6 01/09/2014  CL 104 01/09/2014   CREATININE 2.0* 01/09/2014   BUN 31* 01/09/2014   CO2 30 01/09/2014   TSH 5.47* 01/09/2014   INR 1.6 04/12/2014   HGBA1C 8.3* 01/09/2014   MICROALBUR 168.6* 10/31/2006       Assessment & Plan:

## 2014-04-12 NOTE — Progress Notes (Deleted)
Pre visit review using our clinic review tool, if applicable. No additional management support is needed unless otherwise documented below in the visit note. 

## 2014-04-12 NOTE — Assessment & Plan Note (Signed)
Podiatry ref 

## 2014-04-12 NOTE — Assessment & Plan Note (Signed)
On HD till 8/12 Transplant 8/12

## 2014-04-16 ENCOUNTER — Ambulatory Visit
Admission: RE | Admit: 2014-04-16 | Discharge: 2014-04-16 | Disposition: A | Discharge status: Home / Self Care | Payer: Medicare Other | Source: Ambulatory Visit | Attending: Cardiology | Admitting: Cardiology

## 2014-04-16 DIAGNOSIS — R9439 Abnormal result of other cardiovascular function study: Secondary | ICD-10-CM

## 2014-04-16 DIAGNOSIS — E042 Nontoxic multinodular goiter: Secondary | ICD-10-CM | POA: Diagnosis not present

## 2014-04-21 ENCOUNTER — Other Ambulatory Visit: Payer: Self-pay | Admitting: Internal Medicine

## 2014-04-21 DIAGNOSIS — E041 Nontoxic single thyroid nodule: Secondary | ICD-10-CM

## 2014-04-26 DIAGNOSIS — H34231 Retinal artery branch occlusion, right eye: Secondary | ICD-10-CM | POA: Diagnosis not present

## 2014-04-30 ENCOUNTER — Ambulatory Visit (INDEPENDENT_AMBULATORY_CARE_PROVIDER_SITE_OTHER): Payer: Medicare Other

## 2014-04-30 DIAGNOSIS — Z5181 Encounter for therapeutic drug level monitoring: Secondary | ICD-10-CM

## 2014-04-30 DIAGNOSIS — I4891 Unspecified atrial fibrillation: Secondary | ICD-10-CM | POA: Diagnosis not present

## 2014-04-30 DIAGNOSIS — Z7901 Long term (current) use of anticoagulants: Secondary | ICD-10-CM | POA: Diagnosis not present

## 2014-04-30 LAB — POCT INR: INR: 2.2

## 2014-05-01 ENCOUNTER — Encounter: Payer: Self-pay | Admitting: Endocrinology

## 2014-05-01 ENCOUNTER — Ambulatory Visit (INDEPENDENT_AMBULATORY_CARE_PROVIDER_SITE_OTHER): Payer: Medicare Other | Admitting: Endocrinology

## 2014-05-01 VITALS — BP 124/68 | HR 61 | Temp 98.6°F | Wt 192.0 lb

## 2014-05-01 DIAGNOSIS — E042 Nontoxic multinodular goiter: Secondary | ICD-10-CM

## 2014-05-01 DIAGNOSIS — I2510 Atherosclerotic heart disease of native coronary artery without angina pectoris: Secondary | ICD-10-CM

## 2014-05-01 NOTE — Patient Instructions (Signed)
Please have a biopsy, guided by ultrasound.  you will receive a phone call, about a day and time for an appointment. Given your health problems, it is unlikely that thyroid surgery would be good for you.

## 2014-05-01 NOTE — Progress Notes (Signed)
Subjective:    [name], female    DOB: 1945/12/02, 68 y.o.   MRN: [id number]  HPI Pt was incidentally noted in September of 2015, on a carotid US, to have a multinodular goiter.  She has moderate sensation of "post-nasal drip" in the throat, and assoc choking.  She had a renal transplant in 2012.  She has been on synthroid since early 2015.   Past Medical History  Diagnosis Date  . Coronary artery disease 05/2002    a. Ant MI 2003 s/p PTCA/stent to RCA.   Marland Kitchen Hypertension   . Hyperlipidemia   . CHF (congestive heart failure)   . Pericardial effusion     a. Small by echo 11/2011.  Marland Kitchen GERD (gastroesophageal reflux disease)   . 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 Unspecified hearing loss   . Anemia, iron deficiency     of chronic disease  . Helicobacter pylori (H. pylori) infection     hx of  . Esophagitis, reflux   . Gout   . Cholelithiasis   . Hx of colonic polyps     adenomatous  . Diverticulosis of colon   . 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  Marland Kitchen Chronic cough onset 03/2010    Dr Melvyn Novas  . Carotid artery disease     a. Carotid Dopplers performed in August 2013 showed 40-59% left stenosis and 0-39% right; f/u recommended in 2 years.   . Aortic stenosis     a. Severe AS by echo 11/2012.  . Asystole     a. During ENT surgery 2005: developed marked asystole requiring CPR, felt due to vagal reaction (cath nonobst dz).  . Myocardial infarction 2003  . Cerebrovascular accident 2009    a. LMCA infarct felt embolic 8676, maintained on chronic coumadin.; denies residual on 04/05/2013  . Sleep apnea     Pt says testing was positive, intolerant of CPAP.  Marland Kitchen Type II diabetes mellitus   . ESRD (end stage renal disease)     a. Mass on L kidney per pt s/p nephrectomy - pt states not cancer  - [facility] notes indicate ESRD due to HTN/DM - was previously on HD. b. Kidney transplant 02/2011.  . S/P kidney transplant 03/16/2011  . Chronic Persistent Atrial Fibrillation 12/31/2008    Qualifier: Diagnosis of  By: [name]    . Nodule of right lung 04/07/2013    Ground glass opacity right lung  . S/P aortic valve replacement with bioprosthetic valve and maze procedure 04/12/2013    43mm [facility] Ease bovine pericardial tissue valve   . S/P Maze operation for atrial fibrillation 04/12/2013    Complete bilateral atrial lesion set using bipolar radiofrequency and cryothermy ablation with clipping of LA appendage  . Pruritic dermatitis     treated with steriods/UV light  . Lung nodule seen on imaging study 04/07/2013    1.0 cm ground glass opacity RUL  . Hemorrhoids     Past Surgical History  Procedure Laterality Date  . Cholecystectomy  2009    with hernia removal  . Arteriovenous graft placement Left   . Av fistula placement Right   . Nasal reconstruction with septal repair      "took it out" (04/05/2013)  . Tubal ligation    . Nephrectomy Left 2010  no CA on bx  . Kidney transplant  03/16/11  . Cardioversion  05/29/2012    Procedure: CARDIOVERSION;  Surgeon: Lelon Perla, MD;  Location: [name] ENDOSCOPY;  Service: Cardiovascular;  Laterality: N/A;  . Coronary angioplasty with stent placement Right     coronary artery  . Tonsillectomy    . Total abdominal hysterectomy    . Arteriovenous graft placement Left     "I've had 2 on my left; had one removed" (04/05/2013)   . Av fistula repair Right     "took it out" ((/18/2014)  . Insertion of dialysis catheter Bilateral     "over the years; took them both out" (04/05/2013)  . Intraoperative transesophageal echocardiogram N/A 04/11/2013    Procedure: INTRAOPERATIVE TRANSESOPHAGEAL ECHOCARDIOGRAM;  Surgeon: Rexene Alberts, MD;  Location: [place];  Service: Open Heart Surgery;  Laterality: N/A;  . Artery exploration Right 04/11/2013      Procedure: ARTERY EXPLORATION;  Surgeon: Rexene Alberts, MD;  Location: [place];  Service: Open Heart Surgery;  Laterality: Right;  Right carotid artery exploration  . Aortic valve replacement N/A 04/12/2013    Procedure: AORTIC VALVE REPLACEMENT (AVR);  Surgeon: Rexene Alberts, MD;  Location: [place];  Service: Open Heart Surgery;  Laterality: N/A;  . Maze N/A 04/12/2013    Procedure: MAZE;  Surgeon: Rexene Alberts, MD;  Location: [place];  Service: Open Heart Surgery;  Laterality: N/A;  . Intraoperative transesophageal echocardiogram N/A 04/12/2013    Procedure: INTRAOPERATIVE TRANSESOPHAGEAL ECHOCARDIOGRAM;  Surgeon: Rexene Alberts, MD;  Location: [place];  Service: Open Heart Surgery;  Laterality: N/A;    History   Social History  . Marital Status: Married    Spouse Name: N/A    Number of Children: 64  . Years of Education: N/A   Occupational History  . retired    Social History Main Topics  . Smoking status: Former Smoker -- 1.00 packs/day for 30 years    Types: Cigarettes    Quit date: 07/19/2001  . Smokeless tobacco: Never Used  . Alcohol Use: No  . Drug Use: No  . Sexual Activity: Not Currently   Other Topics Concern  . Not on file   Social History Narrative   Patient signed a Designated Party Release to allow her spouse [name] and family and five children to have access to her medical records/information.      Current Outpatient Prescriptions on File Prior to Visit  Medication Sig Dispense Refill  . Alum & Mag Hydroxide-Simeth (MAGIC MOUTHWASH) SOLN Take 5 mLs by mouth 4 (four) times daily. Swish, hold and swallow  500 mL  1  . amLODipine (NORVASC) 5 MG tablet Take 2.5 mg by mouth daily.      Marland Kitchen amoxicillin (AMOXIL) 500 MG tablet Take all four tablets one hour prior to procedure  4 tablet  6  . dapsone 25 MG tablet Take 25 mg by mouth daily.       . diphenhydrAMINE (BENADRYL) 50 MG capsule Take 1 capsule (50 mg total) by mouth every 6 (six) hours as needed for  itching.  90 capsule  3  . furosemide (LASIX) 80 MG tablet TAKE 1/2 TABLET BY MOUTH TWICE A DAY      . gabapentin (NEURONTIN) 100 MG capsule Take 1 capsule (100 mg total) by mouth 3 (three) times daily as needed (neuropathy).  90 capsule  3  . glipiZIDE (GLUCOTROL XL) 10 MG 24 hr tablet Take 1 tablet (10 mg  total) by mouth daily.  30 tablet  11  . glucose blood test strip Use as instructed qid ac and hs  100 each  12  . insulin lispro (HUMALOG) 100 UNIT/ML SOPN Inject 8 Units into the skin 4 (four) times daily -  before meals and at bedtime.  12 mL  11  . Insulin Pen Needle 31G X 5 MM MISC Test up to TID dx:250.00  100 each  11  . levothyroxine (LEVOTHROID) 25 MCG tablet Take 2 tablets (50 mcg total) by mouth daily.  60 tablet  11  . metoprolol tartrate (LOPRESSOR) 25 MG tablet TAKE 1 TABLET BY MOUTH TWICE A DAY  60 tablet  5  . mycophenolate (MYFORTIC) 180 MG EC tablet Take 360 mg by mouth 2 (two) times daily.       Marland Kitchen omeprazole (PRILOSEC) 40 MG capsule Take 1 capsule (40 mg total) by mouth daily.  30 capsule  3  . ONETOUCH DELICA LANCETS FINE MISC Test up to TID DX:250.00  100 each  11  . oxyCODONE (OXY IR/ROXICODONE) 5 MG immediate release tablet Take 1-2 tablets (5-10 mg total) by mouth every 6 (six) hours as needed.  120 tablet  0  . pravastatin (PRAVACHOL) 40 MG tablet TAKE 1 TABLET BY MOUTH AS DIRECTED  30 tablet  3  . tacrolimus (PROGRAF) 1 MG capsule Take 4 mg by mouth 2 (two) times daily. Take 5 capsules in the am, and 5 capsules in the pm.      . triamcinolone cream (KENALOG) 0.1 % Apply 1 application topically 2 (two) times daily.  60 g  3  . warfarin (COUMADIN) 5 MG tablet Take as directed by coumadin clinic  30 tablet  3   No current facility-administered medications on file prior to visit.    Allergies  Allergen Reactions  . Ibuprofen Nausea And Vomiting  . Sulfamethoxazole-Trimethoprim Swelling  . Sulfonamide Derivatives Swelling  . Tape Rash  . Tramadol Nausea And Vomiting   . Bactrim Swelling  . Red Dye Itching and Rash    Family History  Problem Relation Age of Onset  . Stroke Father   . Diabetes Other   . Hypertension Mother     BP 124/68  Pulse 61  Temp(Src) 98.6 F (37 C) (Oral)  Wt 192 lb (87.091 kg)  SpO2 94%  Review of Systems denies muscle cramps, sob, memory loss, constipation, numbness, blurry vision, cold intolerance, myalgias, rhinorrhea, and syncope. She has weight gain, hair loss, easy bruising, and dry skin.      Objective:   Physical Exam VS: see vs page GEN: no distress HEAD: head: no deformity eyes: no periorbital swelling, no proptosis external nose and ears are normal mouth: no lesion seen NECK:  I can feel only 1 of the right-lobe thyroid nodules.   CHEST WALL: no deformity LUNGS:  Clear to auscultation.  Old healed surgical scar (median sternotomy) CV: reg rate and rhythm, load systolic murmur, with radiation to the left carotid.   ABD: abdomen is soft, nontender.  no hepatosplenomegaly.  not distended.  no hernia MUSCULOSKELETAL: muscle bulk and strength are grossly normal.  no obvious joint swelling.  gait is  Steady, with a cane.   EXTEMITIES: no deformity.  no ulcer on the feet.  feet are of normal color and temp.  no edema PULSES: dorsalis pedis intact bilat.  no carotid bruit.  NEURO:  cn 2-12 grossly intact.   readily moves all 4's.  sensation is intact to  touch on the feet.   SKIN:  Normal texture and temperature.  No rash or suspicious lesion is visible.   NODES:  None palpable at the neck.   PSYCH: alert, well-oriented.  Does not appear anxious nor depressed.    Lab Results  Component Value Date   TSH 5.47* 01/09/2014   Radiol: i reviewed [place] report.    Assessment & Plan:  Multinodular goiter, new to me, uncertain etiology.  If the bx is high-risk for malignancy, he would be a candidate for primary I-131 rx. Hypothyroidism: on rx.  If he needs I-131 rx, he would need to be off synthroid for this.   Therefore, I won't increase now.    Patient is advised the following: Patient Instructions  Please have a biopsy, guided by ultrasound.  you will receive a phone call, about a day and time for an appointment. Given your health problems, it is unlikely that thyroid surgery would be good for you.

## 2014-05-07 ENCOUNTER — Ambulatory Visit (INDEPENDENT_AMBULATORY_CARE_PROVIDER_SITE_OTHER): Payer: Medicare Other

## 2014-05-07 ENCOUNTER — Telehealth: Payer: Self-pay | Admitting: Pharmacist

## 2014-05-07 ENCOUNTER — Other Ambulatory Visit: Payer: Self-pay | Admitting: Endocrinology

## 2014-05-07 VITALS — BP 163/65 | HR 59 | Resp 13 | Ht 62.0 in | Wt 190.0 lb

## 2014-05-07 DIAGNOSIS — E1141 Type 2 diabetes mellitus with diabetic mononeuropathy: Secondary | ICD-10-CM | POA: Diagnosis not present

## 2014-05-07 DIAGNOSIS — M79673 Pain in unspecified foot: Secondary | ICD-10-CM | POA: Diagnosis not present

## 2014-05-07 DIAGNOSIS — M2020 Hallux rigidus, unspecified foot: Secondary | ICD-10-CM

## 2014-05-07 DIAGNOSIS — M7750 Other enthesopathy of unspecified foot: Secondary | ICD-10-CM

## 2014-05-07 DIAGNOSIS — M2010 Hallux valgus (acquired), unspecified foot: Secondary | ICD-10-CM

## 2014-05-07 DIAGNOSIS — E1140 Type 2 diabetes mellitus with diabetic neuropathy, unspecified: Secondary | ICD-10-CM | POA: Diagnosis not present

## 2014-05-07 DIAGNOSIS — I2510 Atherosclerotic heart disease of native coronary artery without angina pectoris: Secondary | ICD-10-CM

## 2014-05-07 DIAGNOSIS — E042 Nontoxic multinodular goiter: Secondary | ICD-10-CM

## 2014-05-07 DIAGNOSIS — M779 Enthesopathy, unspecified: Secondary | ICD-10-CM

## 2014-05-07 DIAGNOSIS — M778 Other enthesopathies, not elsewhere classified: Secondary | ICD-10-CM

## 2014-05-07 MED ORDER — GABAPENTIN 300 MG PO CAPS: 300.0000 mg | ORAL_CAPSULE | Freq: Three times a day (TID) | ORAL | Status: DC

## 2014-05-07 NOTE — Telephone Encounter (Signed)
Message copied by Aris Georgia on Tue May 07, 2014 11:52 AM ------      Message from: Lelon Perla      Created: Thu May 02, 2014  8:15 PM       She will need lovenox bridge as she has h/o cva      [name]            ----- Message -----         From: Andris Baumann, RN         Sent: 05/02/2014   4:23 PM           To: Janith Lima, EMT, Lelon Perla, MD            Dr [name],            Ms Spadafora has been referred to our office by Dr Renato Shin for a thyroid biopsy.  Per radiology protocol, the patient should hold Coumadin X 4 days prior to the biopsy.  As the ordering physician, we will need your approval for this.  The patient will also need to have an INR drawn the morning of the procedure.            Please let us know if this meets with your approval.              Thank you!        Henri, [place]       ------

## 2014-05-07 NOTE — Progress Notes (Signed)
Subjective:     [name], female   DOB: 05/23/1946, 68 y.o.   MRN: [id number]  HPI Comments: N change in sensation L B/L forefoot and plantar feet D over 6 months O  C change of sensation  A diabetic pt T Dr. Alain Marion ordered Gabapentin 100mg  tid prn, pt states she takes the Gabapentin as she needs it    Review of Systems  HENT:       Increased salivation and difficulty swallowing.  Endocrine:       Scheduled for thyroid biopsy.  All other systems reviewed and are negative.      Objective:   Physical Exam Lower extremity objective findings as follows vascular status is intact although diminished pedal pulses are palpable DP +2 PT plus one over 4 bilateral capillary refill time 3 seconds all digits epicritic and proprioceptive sensations grossly abnormal patient has hypersensitivity on palpation to the forefoot in particular the bunion area any attempt at range of motion or plantar flexion or dorsiflexion also has significant pain of the medial band of plantar fascia bilateral consistent with plantar fasciitis/heel spur syndrome she is wearing flip-flops with no support or stability. Patient is otherwise well-developed well-nourished oriented x3 although blood sugar likely not well-managed at this time. Does not Roberta last A1c reading although has sugars that ranged from 150-200 levels. Remainder of exam reveals notable bleed bunion deformities bilateral with lateral deviation of hallux and limitation of motion crepitus of great toe joints bilateral. There is rigid digital contractures lesser digits. And pain on palpation of the plantar fascia bilateral.    Assessment:     Assessment is diabetes with peripheral neuropathy and angiopathy. 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    Plan:     Plan at this time patient is scheduled to come back we'll get authorization for diabetic extra-depth shoes would  benefit from shoes and Plastizote diet diabetic insoles to accommodate deformity provide support for arch and stability as well. Patient also candidate for changes medication discontinue the 100 mg gabapentin and switch to 300 mg titrating over the next one month from 300 at bedtime to 303 times daily. Followup with the next 3 or 4 weeks for orthotic measurements when authorization obtained from her primary physician Dr. Alain Marion. Patient may recheck with the next 2-3 weeks for likely orthotic measurements fitting and diabetic shoe measurement in nature adjust her medication also stressed the importance of managing her diabetes more of stringently. Next paragraph Harriet Masson DPM

## 2014-05-07 NOTE — Patient Instructions (Signed)
Peripheral Neuropathy Peripheral neuropathy is a type of nerve damage. It affects nerves that carry signals between the spinal cord and other parts of the body. These are called peripheral nerves. With peripheral neuropathy, one nerve or a group of nerves may be damaged.  CAUSES  Many things can damage peripheral nerves. For some people with peripheral neuropathy, the cause is unknown. Some causes include:  Diabetes. This is the most common cause of peripheral neuropathy.  Injury to a nerve.  Pressure or stress on a nerve that lasts a long time.  Too little vitamin B. Alcoholism can lead to this.  Infections.  Autoimmune diseases, such as multiple sclerosis and systemic lupus erythematosus.  Inherited nerve diseases.  Some medicines, such as cancer drugs.  Toxic substances, such as lead and mercury.  Too little blood flowing to the legs.  Kidney disease.  Thyroid disease. SIGNS AND SYMPTOMS  Different people have different symptoms. The symptoms you have will depend on which of your nerves is damaged. Common symptoms include:  Loss of feeling (numbness) in the feet and hands.  Tingling in the feet and hands.  Pain that burns.  Very sensitive skin.  Weakness.  Not being able to move a part of the body (paralysis).  Muscle twitching.  Clumsiness or poor coordination.  Loss of balance.  Not being able to control your bladder.  Feeling dizzy.  Sexual problems. DIAGNOSIS  Peripheral neuropathy is a symptom, not a disease. Finding the cause of peripheral neuropathy can be hard. To figure that out, your health care provider will take a medical history and do a physical exam. A neurological exam will also be done. This involves checking things affected by your brain, spinal cord, and nerves (nervous system). For example, your health care provider will check your reflexes, how you move, and what you can feel.  Other types of tests may also be ordered, such as:  Blood  tests.  A test of the fluid in your spinal cord.  Imaging tests, such as CT scans or an MRI.  Electromyography (EMG). This test checks the nerves that control muscles.  Nerve conduction velocity tests. These tests check how fast messages pass through your nerves.  Nerve biopsy. A small piece of nerve is removed. It is then checked under a microscope. TREATMENT   Medicine is often used to treat peripheral neuropathy. Medicines may include:  Pain-relieving medicines. Prescription or over-the-counter medicine may be suggested.  Antiseizure medicine. This may be used for pain.  Antidepressants. These also may help ease pain from neuropathy.  Lidocaine. This is a numbing medicine. You might wear a patch or be given a shot.  Mexiletine. This medicine is typically used to help control irregular heart rhythms.  Surgery. Surgery may be needed to relieve pressure on a nerve or to destroy a nerve that is causing pain.  Physical therapy to help movement.  Assistive devices to help movement. HOME CARE INSTRUCTIONS   Only take over-the-counter or prescription medicines as directed by your health care provider. Follow the instructions carefully for any given medicines. Do not take any other medicines without first getting approval from your health care provider.  If you have diabetes, work closely with your health care provider to keep your blood sugar under control.  If you have numbness in your feet:  Check every day for signs of injury or infection. Watch for redness, warmth, and swelling.  Wear padded socks and comfortable shoes. These help protect your feet.  Do not do   things that put pressure on your damaged nerve.  Do not smoke. Smoking keeps blood from getting to damaged nerves.  Avoid or limit alcohol. Too much alcohol can cause a lack of B vitamins. These vitamins are needed for healthy nerves.  Develop a good support system. Coping with peripheral neuropathy can be  stressful. Talk to a mental health specialist or join a support group if you are struggling.  Follow up with your health care provider as directed. SEEK MEDICAL CARE IF:   You have new signs or symptoms of peripheral neuropathy.  You are struggling emotionally from dealing with peripheral neuropathy.  You have a fever. SEEK IMMEDIATE MEDICAL CARE IF:   You have an injury or infection that is not healing.  You feel very dizzy or begin vomiting.  You have chest pain.  You have trouble breathing. Document Released: 06/25/2002 Document Revised: 03/17/2011 Document Reviewed: 03/12/2013 [facility] Patient Information 2015 [place], [place]. This information is not intended to replace advice given to you by your health care provider. Make sure you discuss any questions you have with your health care provider.    Gabapentin is the drug of choice for treating your diabetic neuropathy. However we will change her dosing as follows  Discontinue the 100 mg gabapentin tablets.  Fill prescription for gabapentin 3 mg tablets, begin taking 300 mg tablet at bedtime every evening for the first week, then take to 300 mg tablets daily (one of those being at bedtime), and after 2 more weeks may begin taking 3 300 mg tablets daily to  Discontinue if any GI her stomach upset. The main side effect is likely sleepiness however if starting at bedtime your body will likely just to that over time.

## 2014-05-07 NOTE — Telephone Encounter (Signed)
Spoke with pt.  Her biopsy is scheduled for 11/4.  She will come to the clinic on 10/26 to set up Lovenox bridge.

## 2014-05-13 ENCOUNTER — Encounter: Payer: Self-pay | Admitting: Thoracic Surgery (Cardiothoracic Vascular Surgery)

## 2014-05-13 ENCOUNTER — Ambulatory Visit (INDEPENDENT_AMBULATORY_CARE_PROVIDER_SITE_OTHER): Payer: Medicare Other | Admitting: Pharmacist

## 2014-05-13 ENCOUNTER — Ambulatory Visit (INDEPENDENT_AMBULATORY_CARE_PROVIDER_SITE_OTHER): Payer: Medicare Other | Admitting: Thoracic Surgery (Cardiothoracic Vascular Surgery)

## 2014-05-13 VITALS — BP 146/87 | HR 57 | Ht 62.0 in | Wt 190.0 lb

## 2014-05-13 DIAGNOSIS — I2510 Atherosclerotic heart disease of native coronary artery without angina pectoris: Secondary | ICD-10-CM | POA: Diagnosis not present

## 2014-05-13 DIAGNOSIS — Z7901 Long term (current) use of anticoagulants: Secondary | ICD-10-CM | POA: Diagnosis not present

## 2014-05-13 DIAGNOSIS — Z5181 Encounter for therapeutic drug level monitoring: Secondary | ICD-10-CM

## 2014-05-13 DIAGNOSIS — I4891 Unspecified atrial fibrillation: Secondary | ICD-10-CM | POA: Diagnosis not present

## 2014-05-13 DIAGNOSIS — Z8679 Personal history of other diseases of the circulatory system: Secondary | ICD-10-CM

## 2014-05-13 DIAGNOSIS — Z954 Presence of other heart-valve replacement: Secondary | ICD-10-CM | POA: Diagnosis not present

## 2014-05-13 DIAGNOSIS — I350 Nonrheumatic aortic (valve) stenosis: Secondary | ICD-10-CM

## 2014-05-13 DIAGNOSIS — Z9889 Other specified postprocedural states: Secondary | ICD-10-CM | POA: Diagnosis not present

## 2014-05-13 DIAGNOSIS — Z953 Presence of xenogenic heart valve: Secondary | ICD-10-CM

## 2014-05-13 LAB — POCT INR: INR: 1.9

## 2014-05-13 MED ORDER — ENOXAPARIN SODIUM 80 MG/0.8ML ~~LOC~~ SOLN: 80.0000 mg | Freq: Two times a day (BID) | SUBCUTANEOUS | Status: DC

## 2014-05-13 NOTE — Patient Instructions (Signed)
  Endocarditis is a potentially serious infection of heart valves or inside lining of the heart.  It occurs more commonly in patients with diseased heart valves (such as patient's with aortic or mitral valve disease) and in patients who have undergone heart valve repair or replacement.  Certain surgical and dental procedures may put you at risk, such as dental cleaning, other dental procedures, or any surgery involving the respiratory, urinary, gastrointestinal tract, gallbladder or prostate gland.   To minimize your chances for develooping endocarditis, maintain good oral health and seek prompt medical attention for any infections involving the mouth, teeth, gums, skin or urinary tract.  Always notify your doctor or dentist about your underlying heart valve condition before having any invasive procedures. You will need to take antibiotics before certain procedures.

## 2014-05-13 NOTE — Progress Notes (Signed)
[address]       [place] [address]             [phone]     CARDIOTHORACIC SURGERY OFFICE NOTE  Referring Provider is Lelon Perla, MD PCP is Walker Kehr, MD   HPI:  Patient returns for routine followup approximately 1 year status post aortic valve replacement using a bioprosthetic tissue valve and Maze procedure on 04/12/2013.  She was last seen here in our office on 11/05/2013. Since then she has continued to do well from a cardiac standpoint. She has not been seen in follow-up by Dr. [name] since last April and her last echocardiogram was approximately 1 year ago. Clinically she is doing well. She states that she feels much better than she did prior to her surgery. At present the patient reports mild symptoms of exertional shortness of breath that only develop if she is pushing herself for doing something relatively strenuous. This does not limit her physical activities. She does not have any exertional chest pain. She has not had any palpitations or dizzy spells. She has not had any complications or problems with long-term anticoagulation using warfarin.  Current Outpatient Prescriptions  Medication Sig Dispense Refill  . Alum & Mag Hydroxide-Simeth (MAGIC MOUTHWASH) SOLN Take 5 mLs by mouth 4 (four) times daily. Swish, hold and swallow  500 mL  1  . amLODipine (NORVASC) 5 MG tablet Take 2.5 mg by mouth daily.      . dapsone 25 MG tablet Take 25 mg by mouth daily.       . furosemide (LASIX) 80 MG tablet TAKE 1/2 TABLET BY MOUTH TWICE A DAY      . gabapentin (NEURONTIN) 300 MG capsule Take 1 capsule (300 mg total) by mouth 3 (three) times daily.  90 capsule  3  . glipiZIDE (GLUCOTROL XL) 10 MG 24 hr tablet Take 1 tablet (10 mg total) by mouth daily.  30 tablet  11  . glucose blood test strip Use as instructed qid ac and hs  100 each  12  . insulin lispro (HUMALOG) 100 UNIT/ML SOPN Inject 8 Units into the skin 4 (four) times daily -  before meals  and at bedtime.  12 mL  11  . Insulin Pen Needle 31G X 5 MM MISC Test up to TID dx:250.00  100 each  11  . levothyroxine (LEVOTHROID) 25 MCG tablet Take 2 tablets (50 mcg total) by mouth daily.  60 tablet  11  . metoprolol tartrate (LOPRESSOR) 25 MG tablet TAKE 1 TABLET BY MOUTH TWICE A DAY  60 tablet  5  . mycophenolate (MYFORTIC) 180 MG EC tablet Take 360 mg by mouth 2 (two) times daily.       Marland Kitchen omeprazole (PRILOSEC) 40 MG capsule Take 1 capsule (40 mg total) by mouth daily.  30 capsule  3  . ONETOUCH DELICA LANCETS FINE MISC Test up to TID DX:250.00  100 each  11  . pravastatin (PRAVACHOL) 40 MG tablet TAKE 1 TABLET BY MOUTH AS DIRECTED  30 tablet  3  . tacrolimus (PROGRAF) 1 MG capsule Take 4 mg by mouth 2 (two) times daily. Take 5 capsules in the am, and 5 capsules in the pm.      . triamcinolone cream (KENALOG) 0.1 % Apply 1 application topically 2 (two) times daily.  60 g  3  . warfarin (COUMADIN) 5 MG tablet Take as directed by coumadin clinic  30 tablet  3  . diphenhydrAMINE (BENADRYL) 50 MG capsule Take 1 capsule (50 mg total) by mouth every 6 (six) hours as needed for itching.  90 capsule  3  . oxyCODONE (OXY IR/ROXICODONE) 5 MG immediate release tablet Take 1-2 tablets (5-10 mg total) by mouth every 6 (six) hours as needed.  120 tablet  0   No current facility-administered medications for this visit.      Physical Exam:   BP 146/87  Pulse 57  Ht 5\' 2"  (1.575 m)  Wt 190 lb (86.183 kg)  BMI 34.74 kg/m2  SpO2 97%  General:  Well-appearing  Chest:   clear  CV:   Regular rate and rhythm without murmur  Incisions:  n/a  Abdomen:  Soft and nontender  Extremities:  Warm and well-perfused  Diagnostic Tests:  2 channel telemetry rhythm strip demonstrates normal sinus rhythm   Impression:  Patient is doing well over one year status post aortic valve replacement using a bioprosthetic tissue valve and Maze procedure. She is maintaining sinus rhythm. She remains chronically  anticoagulated using warfarin. She describes stable symptoms of mild exertional shortness of breath consistent with chronic diastolic congestive heart failure, New York Heart Association functional class II.   Plan:  The patient will return in 6 months for routine follow-up with repeat CT scan to evaluate the small benign-appearing nodule seen her lung on previous radiographic imaging. She will be referred to the atrial fibrillation clinic for long-term surveillance in one year. She will continue to follow-up with Dr. [name] and Dr. Alain [facility].  I spent in excess of 15 minutes during the conduct of this office consultation and >50% of this time involved direct face-to-face encounter with the patient for counseling and/or coordination of their care.   Valentina Gu. Roxy Manns, MD 05/13/2014 1:38 PM

## 2014-05-13 NOTE — Patient Instructions (Signed)
10/30- Take last dose of Coumadin 10/31- No Coumadin or Lovenox  11/1- Lovenox 80mg  in AM and PM 11/2- Lovenox 80mg  in AM and PM 11/3- Lovenox 80mg  in AM and PM 11/4- Day of Procedure.  Take Coumadin 1 tablet that PM 11/5- Lovenox 80mg  in AM and PM AND Coumadin 1 1/2 tablets  11/6- Lovenox 80mg  in AM and PM AND Coumadin 1 1/2 tablets  11/7- Lovenox 80mg  in AM and PM AND Coumadin 1 tablet  11/8- Lovenox 80mg  in AM and PM AND Coumadin 1 tablet  11/9- Recheck INR

## 2014-05-16 ENCOUNTER — Other Ambulatory Visit: Payer: Self-pay | Admitting: Internal Medicine

## 2014-05-16 NOTE — Telephone Encounter (Signed)
Refill done.  

## 2014-05-17 ENCOUNTER — Other Ambulatory Visit: Payer: Self-pay | Admitting: Internal Medicine

## 2014-05-17 DIAGNOSIS — H2513 Age-related nuclear cataract, bilateral: Secondary | ICD-10-CM | POA: Diagnosis not present

## 2014-05-17 DIAGNOSIS — E11321 Type 2 diabetes mellitus with mild nonproliferative diabetic retinopathy with macular edema: Secondary | ICD-10-CM | POA: Diagnosis not present

## 2014-05-17 DIAGNOSIS — H04123 Dry eye syndrome of bilateral lacrimal glands: Secondary | ICD-10-CM | POA: Diagnosis not present

## 2014-05-17 DIAGNOSIS — E119 Type 2 diabetes mellitus without complications: Secondary | ICD-10-CM | POA: Diagnosis not present

## 2014-05-17 LAB — HM DIABETES EYE EXAM

## 2014-05-22 ENCOUNTER — Ambulatory Visit
Admission: RE | Admit: 2014-05-22 | Discharge: 2014-05-22 | Disposition: A | Discharge status: Home / Self Care | Payer: Medicare Other | Source: Ambulatory Visit | Attending: Endocrinology | Admitting: Endocrinology

## 2014-05-22 ENCOUNTER — Other Ambulatory Visit (HOSPITAL_COMMUNITY)
Admission: RE | Admit: 2014-05-22 | Discharge: 2014-05-22 | Disposition: A | Discharge status: Home / Self Care | Payer: Medicare Other | Source: Ambulatory Visit | Attending: Interventional Radiology | Admitting: Interventional Radiology

## 2014-05-22 DIAGNOSIS — E042 Nontoxic multinodular goiter: Secondary | ICD-10-CM | POA: Diagnosis not present

## 2014-05-22 DIAGNOSIS — E041 Nontoxic single thyroid nodule: Secondary | ICD-10-CM | POA: Insufficient documentation

## 2014-05-27 ENCOUNTER — Ambulatory Visit (INDEPENDENT_AMBULATORY_CARE_PROVIDER_SITE_OTHER): Payer: Medicare Other | Admitting: *Deleted

## 2014-05-27 DIAGNOSIS — Z5181 Encounter for therapeutic drug level monitoring: Secondary | ICD-10-CM

## 2014-05-27 DIAGNOSIS — I4891 Unspecified atrial fibrillation: Secondary | ICD-10-CM | POA: Diagnosis not present

## 2014-05-27 DIAGNOSIS — Z7901 Long term (current) use of anticoagulants: Secondary | ICD-10-CM | POA: Diagnosis not present

## 2014-05-27 LAB — POCT INR: INR: 1.6

## 2014-05-28 NOTE — Progress Notes (Signed)
HPI: FU aortic stenosis s/p AVR and atrial fibrillation. Patient has a history of embolic CVA. She has a history of coronary artery disease, status post PCI of the right coronary artery. She underwent cardiac catheterization in September 2014 for severe AS. The right coronary was totally occluded but no other coronary disease noted. She was scheduled for surgery but had a sheath placed into her right carotid artery by mistake. This required surgical repair. Patient subsequently had aortic valve replacement with a pericardial tissue valve. She also had a Maze procedure performed. Last echocardiogram 9/14 showed normal LV function, pericardial tissue valve with no aortic insufficiency, mild left atrial enlargement, mild to moderate tricuspid regurgitation and a trivial pericardial effusion. Note patient had a chest CT in September of 2014 preoperatively that showed nodule in the right upper lobe. Followup recommended in 3 months. Followup study in January of 2015 showed a 10 mm right upper lobe nodule. Followup recommended in one year. Carotid dopplers 9/15 showed 1-39 bilateral stenosis and fu recommended two years; abnormal density in right thyroid; dedicated thyroid ultrasound showed 2 thyroid nodules; Bx showed benign follicular nodule. Since she was last seen She denies dyspnea, chest pain, palpitations or syncope.  Current Outpatient Prescriptions  Medication Sig Dispense Refill  . Alum & Mag Hydroxide-Simeth (MAGIC MOUTHWASH) SOLN Take 5 mLs by mouth 4 (four) times daily. Swish, hold and swallow 500 mL 1  . amLODipine (NORVASC) 5 MG tablet Take 2.5 mg by mouth daily.    . dapsone 25 MG tablet Take 25 mg by mouth daily.     . diphenhydrAMINE (BENADRYL) 50 MG capsule Take 1 capsule (50 mg total) by mouth every 6 (six) hours as needed for itching. 90 capsule 3  . furosemide (LASIX) 80 MG tablet TAKE 1 TABLET BY MOUTH TWICE A DAY 60 tablet 3  . gabapentin (NEURONTIN) 100 MG capsule   3  .  glipiZIDE (GLUCOTROL XL) 10 MG 24 hr tablet Take 1 tablet (10 mg total) by mouth daily. 30 tablet 11  . insulin lispro (HUMALOG) 100 UNIT/ML SOPN Inject 8 Units into the skin 4 (four) times daily -  before meals and at bedtime. 12 mL 11  . Insulin Pen Needle 31G X 5 MM MISC Test up to TID dx:250.00 100 each 11  . levothyroxine (LEVOTHROID) 25 MCG tablet Take 2 tablets (50 mcg total) by mouth daily. 60 tablet 11  . metoprolol tartrate (LOPRESSOR) 25 MG tablet TAKE 1 TABLET BY MOUTH TWICE A DAY 60 tablet 5  . mycophenolate (MYFORTIC) 180 MG EC tablet Take 360 mg by mouth 2 (two) times daily.     Marland Kitchen nystatin (MYCOSTATIN) 100000 UNIT/ML suspension   98  . omeprazole (PRILOSEC) 40 MG capsule Take 1 capsule (40 mg total) by mouth daily. 30 capsule 3  . ONETOUCH DELICA LANCETS FINE MISC Test up to TID DX:250.00 100 each 11  . ONETOUCH VERIO test strip USE AS INSTRUCTED 4 TIMES A DAY (BEFORE MEALS) & AT BEDTIME (ICD 250.4) 125 each 6  . oxyCODONE (OXY IR/ROXICODONE) 5 MG immediate release tablet Take 1-2 tablets (5-10 mg total) by mouth every 6 (six) hours as needed. 120 tablet 0  . pravastatin (PRAVACHOL) 40 MG tablet TAKE 1 TABLET BY MOUTH AS DIRECTED 30 tablet 3  . tacrolimus (PROGRAF) 1 MG capsule Take 4 mg by mouth 2 (two) times daily.     Marland Kitchen triamcinolone cream (KENALOG) 0.1 % Apply 1 application topically 2 (two) times daily.  60 g 3  . warfarin (COUMADIN) 5 MG tablet Take as directed by coumadin clinic 30 tablet 3   No current facility-administered medications for this visit.     Past Medical History  Diagnosis Date  . Coronary artery disease 05/2002    a. Ant MI 2003 s/p PTCA/stent to RCA.   Marland Kitchen Hypertension   . Hyperlipidemia   . CHF (congestive heart failure)   . Pericardial effusion     a. Small by echo 11/2011.  Marland Kitchen GERD (gastroesophageal reflux disease)   . 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 Unspecified hearing loss   . Anemia, iron deficiency     of chronic  disease  . Helicobacter pylori (H. pylori) infection     hx of  . Esophagitis, reflux   . Gout   . Cholelithiasis   . Hx of colonic polyps     adenomatous  . Diverticulosis of colon   . 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  Marland Kitchen Chronic cough onset 03/2010    Dr Melvyn Novas  . Carotid artery disease     a. Carotid Dopplers performed in August 2013 showed 40-59% left stenosis and 0-39% right; f/u recommended in 2 years.   . Aortic stenosis     a. Severe AS by echo 11/2012.  . Asystole     a. During ENT surgery 2005: developed marked asystole requiring CPR, felt due to vagal reaction (cath nonobst dz).  . Myocardial infarction 2003  . Cerebrovascular accident 2009    a. LMCA infarct felt embolic 2297, maintained on chronic coumadin.; denies residual on 04/05/2013  . Sleep apnea     Pt says testing was positive, intolerant of CPAP.  Marland Kitchen Type II diabetes mellitus   . ESRD (end stage renal disease)     a. Mass on L kidney per pt s/p nephrectomy - pt states not cancer - [facility] notes indicate ESRD due to HTN/DM - was previously on HD. b. Kidney transplant 02/2011.  . S/P kidney transplant 03/16/2011  . Chronic Persistent Atrial Fibrillation 12/31/2008    Qualifier: Diagnosis of  By: [name]    . Nodule of right lung 04/07/2013    Ground glass opacity right lung  . S/P aortic valve replacement with bioprosthetic valve and maze procedure 04/12/2013    28mm [facility] Ease bovine pericardial tissue valve   . S/P Maze operation for atrial fibrillation 04/12/2013    Complete bilateral atrial lesion set using bipolar radiofrequency and cryothermy ablation with clipping of LA appendage  . Pruritic dermatitis     treated with steriods/UV light  . Lung nodule seen on imaging study 04/07/2013    1.0 cm ground glass opacity RUL  . Hemorrhoids      Past Surgical History  Procedure Laterality Date  . Cholecystectomy  2009    with hernia removal  . Arteriovenous graft placement Left   . Av fistula placement Right   . Nasal reconstruction with septal repair      "took it out" (04/05/2013)  . Tubal ligation    . Nephrectomy Left 2010    no CA on bx  . Kidney transplant  03/16/11  . Cardioversion  05/29/2012    Procedure: CARDIOVERSION;  Surgeon: Lelon Perla, MD;  Location: [facility] ENDOSCOPY;  Service: Cardiovascular;  Laterality: N/A;  . Coronary angioplasty  with stent placement Right     coronary artery  . Tonsillectomy    . Total abdominal hysterectomy    . Arteriovenous graft placement Left     "I've had 2 on my left; had one removed" (04/05/2013)   . Av fistula repair Right     "took it out" ((/18/2014)  . Insertion of dialysis catheter Bilateral     "over the years; took them both out" (04/05/2013)  . Intraoperative transesophageal echocardiogram N/A 04/11/2013    Procedure: INTRAOPERATIVE TRANSESOPHAGEAL ECHOCARDIOGRAM;  Surgeon: Rexene Alberts, MD;  Location: [place];  Service: Open Heart Surgery;  Laterality: N/A;  . Artery exploration Right 04/11/2013    Procedure: ARTERY EXPLORATION;  Surgeon: Rexene Alberts, MD;  Location: [place];  Service: Open Heart Surgery;  Laterality: Right;  Right carotid artery exploration  . Aortic valve replacement N/A 04/12/2013    Procedure: AORTIC VALVE REPLACEMENT (AVR);  Surgeon: Rexene Alberts, MD;  Location: [place];  Service: Open Heart Surgery;  Laterality: N/A;  . Maze N/A 04/12/2013    Procedure: MAZE;  Surgeon: Rexene Alberts, MD;  Location: [place];  Service: Open Heart Surgery;  Laterality: N/A;  . Intraoperative transesophageal echocardiogram N/A 04/12/2013    Procedure: INTRAOPERATIVE TRANSESOPHAGEAL ECHOCARDIOGRAM;  Surgeon: Rexene Alberts, MD;  Location: [place];  Service: Open Heart Surgery;  Laterality: N/A;    History   Social History  . Marital Status: Married    Spouse  Name: N/A    Number of Children: 69  . Years of Education: N/A   Occupational History  . retired    Social History Main Topics  . Smoking status: Former Smoker -- 1.00 packs/day for 30 years    Types: Cigarettes    Quit date: 07/19/2001  . Smokeless tobacco: Never Used  . Alcohol Use: No  . Drug Use: No  . Sexual Activity: Not Currently   Other Topics Concern  . Not on file   Social History Narrative   Patient signed a Designated Party Release to allow her spouse [name] and family and five children to have access to her medical records/information.      ROS: no fevers or chills, productive cough, hemoptysis, dysphasia, odynophagia, melena, hematochezia, dysuria, hematuria, rash, seizure activity, orthopnea, PND, pedal edema, claudication. Remaining systems are negative.  Physical Exam: Well-developed well-nourished in no acute distress.  Skin is warm and dry.  HEENT is normal.  Neck is supple.  Chest is clear to auscultation with normal expansion.  Cardiovascular exam is regular rate and rhythm. 2/6 systolic murmur left sternal border. Abdominal exam nontender or distended. No masses palpated. Extremities show no edema. neuro grossly intact  ECG Sinus rhythm at a rate of 63. Normal axis. Cannot rule out prior septal infarct. Nonspecific T-wave changes.

## 2014-05-30 ENCOUNTER — Encounter: Payer: Self-pay | Admitting: Cardiology

## 2014-05-30 ENCOUNTER — Ambulatory Visit (INDEPENDENT_AMBULATORY_CARE_PROVIDER_SITE_OTHER): Payer: Medicare Other | Admitting: Cardiology

## 2014-05-30 VITALS — BP 140/80 | HR 63 | Ht 62.0 in | Wt 192.2 lb

## 2014-05-30 DIAGNOSIS — R911 Solitary pulmonary nodule: Secondary | ICD-10-CM

## 2014-05-30 DIAGNOSIS — I359 Nonrheumatic aortic valve disorder, unspecified: Secondary | ICD-10-CM | POA: Diagnosis not present

## 2014-05-30 DIAGNOSIS — I480 Paroxysmal atrial fibrillation: Secondary | ICD-10-CM

## 2014-05-30 DIAGNOSIS — I2510 Atherosclerotic heart disease of native coronary artery without angina pectoris: Secondary | ICD-10-CM | POA: Diagnosis not present

## 2014-05-30 DIAGNOSIS — I10 Essential (primary) hypertension: Secondary | ICD-10-CM

## 2014-05-30 DIAGNOSIS — Z954 Presence of other heart-valve replacement: Secondary | ICD-10-CM | POA: Diagnosis not present

## 2014-05-30 DIAGNOSIS — Z953 Presence of xenogenic heart valve: Secondary | ICD-10-CM

## 2014-05-30 NOTE — Assessment & Plan Note (Signed)
Continue statin. 

## 2014-05-30 NOTE — Assessment & Plan Note (Signed)
Blood pressure controlled. Continue present medications. 

## 2014-05-30 NOTE — Assessment & Plan Note (Signed)
Continue statin. Not on aspirin given need for Coumadin. 

## 2014-05-30 NOTE — Patient Instructions (Signed)
Your physician wants you to follow-up in: [place] will receive a reminder letter in the mail two months in advance. If you don't receive a letter, please call our office to schedule the follow-up appointment.   Non-Cardiac CT scanning, (CAT scanning), is a noninvasive, special x-ray that produces cross-sectional images of the body using x-rays and a computer. CT scans help physicians diagnose and treat medical conditions. For some CT exams, a contrast material is used to enhance visibility in the area of the body being studied. CT scans provide greater clarity and reveal more details than regular x-ray exams.CT OF CHEST WITHOUT CONTRAST TO F/U LUNG NODULE=SCHEDULE IN [facility] AT [place]

## 2014-05-30 NOTE — Assessment & Plan Note (Signed)
Follow-up carotid Doppler September 2017.

## 2014-05-30 NOTE — Assessment & Plan Note (Signed)
Patient remains in sinus rhythm status post Maze procedure.Continue metoprolol and Coumadin. Given history of CVA would be hesitant to discontinue Coumadin.

## 2014-05-30 NOTE — Assessment & Plan Note (Signed)
Follow-up noncontrast chest CT January 2016.

## 2014-05-30 NOTE — Assessment & Plan Note (Signed)
Continue SBE prophylaxis. 

## 2014-06-03 ENCOUNTER — Ambulatory Visit (INDEPENDENT_AMBULATORY_CARE_PROVIDER_SITE_OTHER): Payer: Medicare Other | Admitting: *Deleted

## 2014-06-03 DIAGNOSIS — Z7901 Long term (current) use of anticoagulants: Secondary | ICD-10-CM

## 2014-06-03 DIAGNOSIS — I480 Paroxysmal atrial fibrillation: Secondary | ICD-10-CM | POA: Diagnosis not present

## 2014-06-03 DIAGNOSIS — Z5181 Encounter for therapeutic drug level monitoring: Secondary | ICD-10-CM

## 2014-06-03 DIAGNOSIS — I4891 Unspecified atrial fibrillation: Secondary | ICD-10-CM

## 2014-06-03 LAB — POCT INR
INR: 1.5
INR: 1.6

## 2014-06-04 ENCOUNTER — Ambulatory Visit (INDEPENDENT_AMBULATORY_CARE_PROVIDER_SITE_OTHER): Payer: Medicare Other

## 2014-06-04 VITALS — BP 110/71 | HR 71 | Resp 18

## 2014-06-04 DIAGNOSIS — M79673 Pain in unspecified foot: Secondary | ICD-10-CM

## 2014-06-04 DIAGNOSIS — M2010 Hallux valgus (acquired), unspecified foot: Secondary | ICD-10-CM | POA: Diagnosis not present

## 2014-06-04 DIAGNOSIS — I2510 Atherosclerotic heart disease of native coronary artery without angina pectoris: Secondary | ICD-10-CM | POA: Diagnosis not present

## 2014-06-04 DIAGNOSIS — E1141 Type 2 diabetes mellitus with diabetic mononeuropathy: Secondary | ICD-10-CM

## 2014-06-04 DIAGNOSIS — E1140 Type 2 diabetes mellitus with diabetic neuropathy, unspecified: Secondary | ICD-10-CM

## 2014-06-04 NOTE — Patient Instructions (Signed)
Diabetes and Foot Care Diabetes may cause you to have problems because of poor blood supply (circulation) to your feet and legs. This may cause the skin on your feet to become thinner, break easier, and heal more slowly. Your skin may become dry, and the skin may peel and crack. You may also have nerve damage in your legs and feet causing decreased feeling in them. You may not notice minor injuries to your feet that could lead to infections or more serious problems. Taking care of your feet is one of the most important things you can do for yourself.  HOME CARE INSTRUCTIONS  Wear shoes at all times, even in the house. Do not go barefoot. Bare feet are easily injured.  Check your feet daily for blisters, cuts, and redness. If you cannot see the bottom of your feet, use a mirror or ask someone for help.  Wash your feet with warm water (do not use hot water) and mild soap. Then pat your feet and the areas between your toes until they are completely dry. Do not soak your feet as this can dry your skin.  Apply a moisturizing lotion or petroleum jelly (that does not contain alcohol and is unscented) to the skin on your feet and to dry, brittle toenails. Do not apply lotion between your toes.  Trim your toenails straight across. Do not dig under them or around the cuticle. File the edges of your nails with an emery board or nail file.  Do not cut corns or calluses or try to remove them with medicine.  Wear clean socks or stockings every day. Make sure they are not too tight. Do not wear knee-high stockings since they may decrease blood flow to your legs.  Wear shoes that fit properly and have enough cushioning. To break in new shoes, wear them for just a few hours a day. This prevents you from injuring your feet. Always look in your shoes before you put them on to be sure there are no objects inside.  Do not cross your legs. This may decrease the blood flow to your feet.  If you find a minor scrape,  cut, or break in the skin on your feet, keep it and the skin around it clean and dry. These areas may be cleansed with mild soap and water. Do not cleanse the area with peroxide, alcohol, or iodine.  When you remove an adhesive bandage, be sure not to damage the skin around it.  If you have a wound, look at it several times a day to make sure it is healing.  Do not use heating pads or hot water bottles. They may burn your skin. If you have lost feeling in your feet or legs, you may not know it is happening until it is too late.  Make sure your health care provider performs a complete foot exam at least annually or more often if you have foot problems. Report any cuts, sores, or bruises to your health care provider immediately. SEEK MEDICAL CARE IF:   You have an injury that is not healing.  You have cuts or breaks in the skin.  You have an ingrown nail.  You notice redness on your legs or feet.  You feel burning or tingling in your legs or feet.  You have pain or cramps in your legs and feet.  Your legs or feet are numb.  Your feet always feel cold. SEEK IMMEDIATE MEDICAL CARE IF:   There is increasing redness,   swelling, or pain in or around a wound.  There is a red line that goes up your leg.  Pus is coming from a wound.  You develop a fever or as directed by your health care provider.  You notice a bad smell coming from an ulcer or wound. Document Released: 07/02/2000 Document Revised: 03/07/2013 Document Reviewed: 12/12/2012 ExitCare Patient Information 2015 [employer]. This information is not intended to replace advice given to you by your health care provider. Make sure you discuss any questions you have with your health care provider.  

## 2014-06-04 NOTE — Progress Notes (Signed)
   Subjective:    [name], female    DOB: 19-May-1946, 68 y.o.   MRN: [id number]  HPI I am about the same and I was taken the gabapentin from both doctors, Dr Liberty Handy was given it to me and Dr. Blenda Mounts was given to me and it made me not want to wake up    Review of Systemsno new findings or systemic changes noted should note patient is status post a renal transplant she did have some swelling issues after she tried increase to gabapentin dose but only took 1 pill and did not feel well and went back to taking 100 mg rather than 3 mg gabapentin     Objective:   Physical Exam Lower extremity objective findings vascular status unchanged pedal pulses palpable DP +2 PT plus one over 4 Refill time 3 seconds patient does have notable bunion deformity with digital contractures as well as plantar fascial symptomology which would benefit from custom orthoses and insoles and diabetic insoles custom-made. Authorization from Dr. Dorann Lodge (please been out of the office are out of town once receive and authorization will fabricate diabetic shoes and custom insoles as recommended. Measurements and impressions are carried out at this time for the diabetic shoes in anticipation of and authorization. As far as the gabapentin will continue with gabapentin 100 mg up to 3 times daily in divided doses an as tolerated. Also may suggest some topical painfulness such as Aspercreme or icy hot which patient has been using occasionally.       Assessment & Plan:  Assessment diabetes with history peripheral neuropathy as well as some arthropathy and capsulitis patient waiting on authorization for diabetic extra depth shoes and Plastizote inlays measurements and bio foam impressions are carried out at this time we'll contact patient when authorization obtained will maintain gabapentin 100 mg dosing at this time. Reappointed in 3 or 4 weeks for follow-up and orthotic and shoe pick up at that time.  Harriet Masson DPM

## 2014-06-05 ENCOUNTER — Other Ambulatory Visit: Payer: Self-pay | Admitting: Internal Medicine

## 2014-06-10 ENCOUNTER — Ambulatory Visit (INDEPENDENT_AMBULATORY_CARE_PROVIDER_SITE_OTHER): Payer: Medicare Other | Admitting: *Deleted

## 2014-06-10 DIAGNOSIS — Z5181 Encounter for therapeutic drug level monitoring: Secondary | ICD-10-CM

## 2014-06-10 DIAGNOSIS — I4891 Unspecified atrial fibrillation: Secondary | ICD-10-CM | POA: Diagnosis not present

## 2014-06-10 DIAGNOSIS — Z7901 Long term (current) use of anticoagulants: Secondary | ICD-10-CM

## 2014-06-10 DIAGNOSIS — I480 Paroxysmal atrial fibrillation: Secondary | ICD-10-CM | POA: Diagnosis not present

## 2014-06-10 LAB — POCT INR: INR: 1.7

## 2014-06-18 ENCOUNTER — Other Ambulatory Visit: Payer: Self-pay

## 2014-06-18 MED ORDER — METOPROLOL TARTRATE 25 MG PO TABS: ORAL_TABLET | ORAL | Status: DC

## 2014-06-24 ENCOUNTER — Ambulatory Visit (INDEPENDENT_AMBULATORY_CARE_PROVIDER_SITE_OTHER): Payer: Medicare Other

## 2014-06-24 ENCOUNTER — Other Ambulatory Visit: Payer: Self-pay

## 2014-06-24 DIAGNOSIS — I4891 Unspecified atrial fibrillation: Secondary | ICD-10-CM

## 2014-06-24 DIAGNOSIS — Z7901 Long term (current) use of anticoagulants: Secondary | ICD-10-CM

## 2014-06-24 DIAGNOSIS — Z5181 Encounter for therapeutic drug level monitoring: Secondary | ICD-10-CM

## 2014-06-24 LAB — POCT INR: INR: 1.9

## 2014-06-24 MED ORDER — AMLODIPINE BESYLATE 5 MG PO TABS: 2.5000 mg | ORAL_TABLET | Freq: Every day | ORAL | Status: DC

## 2014-06-27 ENCOUNTER — Encounter (HOSPITAL_COMMUNITY): Payer: Self-pay | Admitting: Cardiovascular Disease

## 2014-07-01 DIAGNOSIS — N189 Chronic kidney disease, unspecified: Secondary | ICD-10-CM | POA: Diagnosis not present

## 2014-07-01 DIAGNOSIS — Z79899 Other long term (current) drug therapy: Secondary | ICD-10-CM | POA: Diagnosis not present

## 2014-07-01 DIAGNOSIS — Z940 Kidney transplant status: Secondary | ICD-10-CM | POA: Diagnosis not present

## 2014-07-03 DIAGNOSIS — Z940 Kidney transplant status: Secondary | ICD-10-CM | POA: Diagnosis not present

## 2014-07-03 DIAGNOSIS — R1310 Dysphagia, unspecified: Secondary | ICD-10-CM | POA: Diagnosis not present

## 2014-07-03 DIAGNOSIS — I10 Essential (primary) hypertension: Secondary | ICD-10-CM | POA: Diagnosis not present

## 2014-07-03 DIAGNOSIS — D899 Disorder involving the immune mechanism, unspecified: Secondary | ICD-10-CM | POA: Diagnosis not present

## 2014-07-22 ENCOUNTER — Ambulatory Visit (INDEPENDENT_AMBULATORY_CARE_PROVIDER_SITE_OTHER): Payer: Medicare Other | Admitting: Pharmacist

## 2014-07-22 DIAGNOSIS — Z5181 Encounter for therapeutic drug level monitoring: Secondary | ICD-10-CM

## 2014-07-22 DIAGNOSIS — Z7901 Long term (current) use of anticoagulants: Secondary | ICD-10-CM | POA: Diagnosis not present

## 2014-07-22 DIAGNOSIS — I4891 Unspecified atrial fibrillation: Secondary | ICD-10-CM

## 2014-07-22 LAB — POCT INR: INR: 3.3

## 2014-07-23 DIAGNOSIS — E11329 Type 2 diabetes mellitus with mild nonproliferative diabetic retinopathy without macular edema: Secondary | ICD-10-CM | POA: Diagnosis not present

## 2014-07-23 DIAGNOSIS — H2513 Age-related nuclear cataract, bilateral: Secondary | ICD-10-CM | POA: Diagnosis not present

## 2014-07-26 ENCOUNTER — Other Ambulatory Visit: Payer: Self-pay | Admitting: *Deleted

## 2014-07-26 MED ORDER — WARFARIN SODIUM 5 MG PO TABS: ORAL_TABLET | ORAL | Status: DC

## 2014-07-30 ENCOUNTER — Other Ambulatory Visit: Payer: Self-pay | Admitting: Internal Medicine

## 2014-07-31 ENCOUNTER — Ambulatory Visit (INDEPENDENT_AMBULATORY_CARE_PROVIDER_SITE_OTHER)
Admission: RE | Admit: 2014-07-31 | Discharge: 2014-07-31 | Disposition: A | Discharge status: Home / Self Care | Payer: Medicare Other | Source: Ambulatory Visit | Attending: Cardiology | Admitting: Cardiology

## 2014-07-31 DIAGNOSIS — R918 Other nonspecific abnormal finding of lung field: Secondary | ICD-10-CM | POA: Diagnosis not present

## 2014-07-31 DIAGNOSIS — R911 Solitary pulmonary nodule: Secondary | ICD-10-CM | POA: Diagnosis not present

## 2014-08-12 ENCOUNTER — Ambulatory Visit (INDEPENDENT_AMBULATORY_CARE_PROVIDER_SITE_OTHER): Payer: Medicare Other | Admitting: *Deleted

## 2014-08-12 ENCOUNTER — Encounter: Payer: Self-pay | Admitting: Internal Medicine

## 2014-08-12 ENCOUNTER — Ambulatory Visit (INDEPENDENT_AMBULATORY_CARE_PROVIDER_SITE_OTHER): Payer: Medicare Other | Admitting: Internal Medicine

## 2014-08-12 VITALS — BP 132/74 | HR 66 | Temp 98.4°F | Wt 195.0 lb

## 2014-08-12 DIAGNOSIS — E1122 Type 2 diabetes mellitus with diabetic chronic kidney disease: Secondary | ICD-10-CM

## 2014-08-12 DIAGNOSIS — R635 Abnormal weight gain: Secondary | ICD-10-CM | POA: Diagnosis not present

## 2014-08-12 DIAGNOSIS — N189 Chronic kidney disease, unspecified: Secondary | ICD-10-CM | POA: Diagnosis not present

## 2014-08-12 DIAGNOSIS — K117 Disturbances of salivary secretion: Secondary | ICD-10-CM

## 2014-08-12 DIAGNOSIS — M1000 Idiopathic gout, unspecified site: Secondary | ICD-10-CM | POA: Diagnosis not present

## 2014-08-12 DIAGNOSIS — Z7901 Long term (current) use of anticoagulants: Secondary | ICD-10-CM | POA: Diagnosis not present

## 2014-08-12 DIAGNOSIS — Z5181 Encounter for therapeutic drug level monitoring: Secondary | ICD-10-CM | POA: Diagnosis not present

## 2014-08-12 DIAGNOSIS — I4891 Unspecified atrial fibrillation: Secondary | ICD-10-CM

## 2014-08-12 DIAGNOSIS — M5440 Lumbago with sciatica, unspecified side: Secondary | ICD-10-CM | POA: Diagnosis not present

## 2014-08-12 LAB — POCT INR: INR: 2.2

## 2014-08-12 MED ORDER — AMOXICILLIN 500 MG PO TABS: ORAL_TABLET | ORAL | Status: DC

## 2014-08-12 MED ORDER — TRIAMCINOLONE ACETONIDE 0.1 % EX CREA
1.0000 | TOPICAL_CREAM | Freq: Two times a day (BID) | CUTANEOUS | Status: DC
Start: 2014-08-12 — End: 2015-11-10

## 2014-08-12 NOTE — Assessment & Plan Note (Signed)
Wt Readings from Last 3 Encounters:  08/12/14 195 lb (88.451 kg)  05/30/14 192 lb 3.2 oz (87.181 kg)  05/13/14 190 lb (86.183 kg)  Discussed diet

## 2014-08-12 NOTE — Assessment & Plan Note (Signed)
Continue with current prescription therapy as reflected on the Med list. Labs  

## 2014-08-12 NOTE — Assessment & Plan Note (Signed)
Wt loss, diet discussed

## 2014-08-12 NOTE — Progress Notes (Signed)
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  Pt with severe aortic stenosis now s/p AVR on 04/11/13.  [name] elev CBG: using Insulin tid - better CBGs. No low CBGs  Novolog pen is not covered, Humalog is covered   HPI  The patient presents for a follow-up of  Chronic ESRD, hypertension, chronic dyslipidemia, type 2 diabetes not controlled with medicines. She just had a renal tranplant in 8/12. [name] itching and rash - not better.   Declined a flu shot     BP Readings from Last 3 Encounters:  08/12/14 132/74  06/04/14 110/71  05/30/14 140/80   Wt Readings from Last 3 Encounters:  08/12/14 195 lb (88.451 kg)  05/30/14 192 lb 3.2 oz (87.181 kg)  05/13/14 190 lb (86.183 kg)     Review of Systems  Constitutional: Negative for chills, activity change, appetite change and unexpected weight change.  HENT: Negative for mouth sores and sinus pressure.   Eyes: Negative for visual disturbance.  Respiratory: Negative for chest tightness.   Gastrointestinal: Negative for nausea and abdominal pain.  Genitourinary: Negative for frequency, difficulty urinating and vaginal pain.  Musculoskeletal: Positive for arthralgias and gait problem. Negative for back pain.  Skin: Positive for color change and rash.       pruritis  Neurological: Positive for numbness.  Psychiatric/Behavioral: Negative for sleep disturbance.       Objective:   Physical Exam  Constitutional: No distress.  Looks much better  HENT:  Head: Normocephalic.  Right Ear: External ear normal.  Left Ear: External ear normal.  Nose: Nose normal.  Mouth/Throat: Oropharynx is clear and moist.  Eyes: Conjunctivae are normal. Pupils are equal, round, and reactive to light. Right eye exhibits  no discharge. Left eye exhibits no discharge.  Neck: Normal range of motion. Neck supple. No JVD present. No tracheal deviation present. No thyromegaly present.  Cardiovascular: Normal rate and regular rhythm.  Exam reveals no gallop and no friction rub.   Murmur (2/6) heard. Pulmonary/Chest: No stridor. No respiratory distress. She has no wheezes.  Abdominal: Soft. Bowel sounds are normal. She exhibits no distension and no mass. There is no tenderness. There is no rebound and no guarding.  Musculoskeletal: She exhibits no edema or tenderness.  Walking w/a cane  Lymphadenopathy:    She has no cervical adenopathy.  Neurological: She displays normal reflexes. No cranial nerve deficit. She exhibits normal muscle tone. Coordination normal.  Skin: Rash (hives and hyperpigmentation on LE and UEs B) noted. No erythema.  Scar on chest and   Psychiatric: She has a normal mood and affect. Her behavior is normal. Thought content normal.  Distal feet are tender B Graft in L arm seems to be ok  Lab Results  Component Value Date   WBC 6.4 01/09/2014   HGB 11.8* 01/09/2014   HCT 37.1 01/09/2014   PLT 213.0 01/09/2014   GLUCOSE 200* 04/12/2014   CHOL 164 04/06/2013   TRIG 125 04/06/2013   HDL 73 04/06/2013   LDLDIRECT 89.3 06/13/2006   LDLCALC 66 04/06/2013   ALT 24 01/09/2014   AST 23 01/09/2014   NA 138 04/12/2014   K 3.7 04/12/2014   CL 103 04/12/2014   CREATININE 1.6*  04/12/2014   BUN 27* 04/12/2014   CO2 30 04/12/2014   TSH 5.47* 01/09/2014   INR 2.2 08/12/2014   HGBA1C 7.9* 04/12/2014   MICROALBUR 168.6* 10/31/2006       Assessment & Plan:

## 2014-08-12 NOTE — Progress Notes (Signed)
Pre visit review using our clinic review tool, if applicable. No additional management support is needed unless otherwise documented below in the visit note. 

## 2014-08-12 NOTE — Assessment & Plan Note (Signed)
Continue with current prescription therapy as reflected on the Med list.  

## 2014-08-14 ENCOUNTER — Other Ambulatory Visit: Payer: Self-pay | Admitting: Internal Medicine

## 2014-08-27 ENCOUNTER — Ambulatory Visit (INDEPENDENT_AMBULATORY_CARE_PROVIDER_SITE_OTHER): Payer: Medicare Other

## 2014-08-27 VITALS — BP 132/64 | HR 82 | Resp 12

## 2014-08-27 DIAGNOSIS — E1140 Type 2 diabetes mellitus with diabetic neuropathy, unspecified: Secondary | ICD-10-CM

## 2014-08-27 DIAGNOSIS — E1141 Type 2 diabetes mellitus with diabetic mononeuropathy: Secondary | ICD-10-CM

## 2014-08-27 DIAGNOSIS — M79673 Pain in unspecified foot: Secondary | ICD-10-CM

## 2014-08-27 DIAGNOSIS — M2010 Hallux valgus (acquired), unspecified foot: Secondary | ICD-10-CM

## 2014-08-27 NOTE — Patient Instructions (Signed)

## 2014-08-27 NOTE — Progress Notes (Signed)
   Subjective:    [name], female    DOB: May 31, 1946, 69 y.o.   MRN: [id number]  HPI PUD AND GIVEN INSTRUCTION.   Review of Systems no new findings or systemic changes     Objective:   Physical Exam Patient presents for diabetic shoe fitting and pick up all the shoes that were delivered do not sit or contoured well to the patient's foot action work fine however we'll reorder new shoes patient be contacted with the next week or 2 when she is a red available for fitting and dispensing.       Assessment & Plan:  Assessment diabetes history peripheral neuropathy and angiopathy. Patient Candy for diabetic extra shoes and insoles however the shoes that were delivered did not fit adequately we'll reorder shoes more appropriate size and fit and we'll follow-up with the next 2 weeks when she is ready for fitting next para Claudette Stapler DPM

## 2014-08-28 NOTE — Progress Notes (Signed)
Patient has chosen the [employer] A300 women's size 8 medium.  We have informed her that Dr Blenda Mounts does not recommend this shoe due to instability patient insisted this is the shoe she wants and is going to get.  Dr Blenda Mounts was made aware of the issue and said to order the shoe.

## 2014-09-03 ENCOUNTER — Ambulatory Visit (INDEPENDENT_AMBULATORY_CARE_PROVIDER_SITE_OTHER): Payer: Medicare Other | Admitting: *Deleted

## 2014-09-03 DIAGNOSIS — Z7901 Long term (current) use of anticoagulants: Secondary | ICD-10-CM | POA: Diagnosis not present

## 2014-09-03 DIAGNOSIS — Z5181 Encounter for therapeutic drug level monitoring: Secondary | ICD-10-CM

## 2014-09-03 DIAGNOSIS — I4891 Unspecified atrial fibrillation: Secondary | ICD-10-CM | POA: Diagnosis not present

## 2014-09-03 LAB — POCT INR: INR: 2.9

## 2014-09-16 DIAGNOSIS — Z79899 Other long term (current) drug therapy: Secondary | ICD-10-CM | POA: Diagnosis not present

## 2014-09-16 DIAGNOSIS — N189 Chronic kidney disease, unspecified: Secondary | ICD-10-CM | POA: Diagnosis not present

## 2014-09-16 DIAGNOSIS — Z189 Retained foreign body fragments, unspecified material: Secondary | ICD-10-CM | POA: Diagnosis not present

## 2014-09-16 DIAGNOSIS — Z940 Kidney transplant status: Secondary | ICD-10-CM | POA: Diagnosis not present

## 2014-09-17 ENCOUNTER — Ambulatory Visit (INDEPENDENT_AMBULATORY_CARE_PROVIDER_SITE_OTHER): Payer: Medicare Other | Admitting: Podiatry

## 2014-09-17 DIAGNOSIS — E1140 Type 2 diabetes mellitus with diabetic neuropathy, unspecified: Secondary | ICD-10-CM

## 2014-09-17 DIAGNOSIS — M2020 Hallux rigidus, unspecified foot: Secondary | ICD-10-CM

## 2014-09-17 DIAGNOSIS — E1141 Type 2 diabetes mellitus with diabetic mononeuropathy: Secondary | ICD-10-CM

## 2014-09-17 DIAGNOSIS — M79673 Pain in unspecified foot: Secondary | ICD-10-CM

## 2014-09-17 NOTE — Patient Instructions (Signed)

## 2014-09-17 NOTE — Progress Notes (Signed)
   Subjective:    [name], female    DOB: 1946-02-16, 69 y.o.   MRN: [id number]  HPI  PUD AND GIVEN INSTRUCTION.  Review of Systems     Objective:   Physical Exam        Assessment & Plan:

## 2014-09-17 NOTE — Progress Notes (Signed)
Subjective:     [name], female   DOB: 07-17-1946, 69 y.o.   MRN: [id number]  HPI long-term at risk diabetic presents to pickup diabetic shoes   Review of Systems     Objective:   Physical Exam Neurovascular status diminished unchanged with structural deformity of feet edema noted and diminishment of sharp [employer] vibratory and hair growth    Assessment:     At risk diabetic with deformity    Plan:     Diabetic shoes were dispensed with instructions on usage

## 2014-09-22 ENCOUNTER — Other Ambulatory Visit: Payer: Self-pay | Admitting: Internal Medicine

## 2014-09-23 ENCOUNTER — Other Ambulatory Visit: Payer: Self-pay

## 2014-09-23 MED ORDER — METOPROLOL TARTRATE 25 MG PO TABS: ORAL_TABLET | ORAL | Status: DC

## 2014-09-23 NOTE — Telephone Encounter (Signed)
Lelon Perla, MD at 05/28/2014 3:29 PM  metoprolol tartrate (LOPRESSOR) 25 MG tabletTAKE 1 TABLET BY MOUTH TWICE A DAY

## 2014-09-24 DIAGNOSIS — E119 Type 2 diabetes mellitus without complications: Secondary | ICD-10-CM | POA: Diagnosis not present

## 2014-09-24 DIAGNOSIS — H2513 Age-related nuclear cataract, bilateral: Secondary | ICD-10-CM | POA: Diagnosis not present

## 2014-09-26 DIAGNOSIS — I10 Essential (primary) hypertension: Secondary | ICD-10-CM | POA: Diagnosis not present

## 2014-09-26 DIAGNOSIS — D899 Disorder involving the immune mechanism, unspecified: Secondary | ICD-10-CM | POA: Diagnosis not present

## 2014-09-26 DIAGNOSIS — R1310 Dysphagia, unspecified: Secondary | ICD-10-CM | POA: Diagnosis not present

## 2014-09-26 DIAGNOSIS — Z940 Kidney transplant status: Secondary | ICD-10-CM | POA: Diagnosis not present

## 2014-10-01 ENCOUNTER — Ambulatory Visit (INDEPENDENT_AMBULATORY_CARE_PROVIDER_SITE_OTHER): Payer: Medicare Other | Admitting: *Deleted

## 2014-10-01 DIAGNOSIS — Z7901 Long term (current) use of anticoagulants: Secondary | ICD-10-CM

## 2014-10-01 DIAGNOSIS — I4891 Unspecified atrial fibrillation: Secondary | ICD-10-CM

## 2014-10-01 DIAGNOSIS — Z5181 Encounter for therapeutic drug level monitoring: Secondary | ICD-10-CM

## 2014-10-01 LAB — POCT INR: INR: 2.2

## 2014-10-08 ENCOUNTER — Other Ambulatory Visit: Payer: Self-pay | Admitting: *Deleted

## 2014-10-08 DIAGNOSIS — R911 Solitary pulmonary nodule: Secondary | ICD-10-CM

## 2014-10-23 DIAGNOSIS — N189 Chronic kidney disease, unspecified: Secondary | ICD-10-CM | POA: Diagnosis not present

## 2014-10-23 DIAGNOSIS — H3561 Retinal hemorrhage, right eye: Secondary | ICD-10-CM | POA: Diagnosis not present

## 2014-10-23 DIAGNOSIS — Z79899 Other long term (current) drug therapy: Secondary | ICD-10-CM | POA: Diagnosis not present

## 2014-10-23 DIAGNOSIS — N2581 Secondary hyperparathyroidism of renal origin: Secondary | ICD-10-CM | POA: Diagnosis not present

## 2014-10-23 DIAGNOSIS — H34231 Retinal artery branch occlusion, right eye: Secondary | ICD-10-CM | POA: Diagnosis not present

## 2014-10-23 DIAGNOSIS — Z940 Kidney transplant status: Secondary | ICD-10-CM | POA: Diagnosis not present

## 2014-10-23 DIAGNOSIS — H3582 Retinal ischemia: Secondary | ICD-10-CM | POA: Diagnosis not present

## 2014-11-05 ENCOUNTER — Ambulatory Visit (INDEPENDENT_AMBULATORY_CARE_PROVIDER_SITE_OTHER): Payer: Medicare Other | Admitting: *Deleted

## 2014-11-05 DIAGNOSIS — I4891 Unspecified atrial fibrillation: Secondary | ICD-10-CM | POA: Diagnosis not present

## 2014-11-05 DIAGNOSIS — Z5181 Encounter for therapeutic drug level monitoring: Secondary | ICD-10-CM | POA: Diagnosis not present

## 2014-11-05 DIAGNOSIS — Z7901 Long term (current) use of anticoagulants: Secondary | ICD-10-CM | POA: Diagnosis not present

## 2014-11-05 LAB — POCT INR: INR: 2

## 2014-11-08 DIAGNOSIS — H34231 Retinal artery branch occlusion, right eye: Secondary | ICD-10-CM | POA: Diagnosis not present

## 2014-11-11 ENCOUNTER — Ambulatory Visit (INDEPENDENT_AMBULATORY_CARE_PROVIDER_SITE_OTHER): Payer: Medicare Other | Admitting: Internal Medicine

## 2014-11-11 ENCOUNTER — Ambulatory Visit (INDEPENDENT_AMBULATORY_CARE_PROVIDER_SITE_OTHER): Payer: Medicare Other | Admitting: Thoracic Surgery (Cardiothoracic Vascular Surgery)

## 2014-11-11 ENCOUNTER — Encounter: Payer: Self-pay | Admitting: Internal Medicine

## 2014-11-11 ENCOUNTER — Encounter: Payer: Self-pay | Admitting: Thoracic Surgery (Cardiothoracic Vascular Surgery)

## 2014-11-11 ENCOUNTER — Ambulatory Visit
Admission: RE | Admit: 2014-11-11 | Discharge: 2014-11-11 | Disposition: A | Discharge status: Home / Self Care | Payer: Medicare Other | Source: Ambulatory Visit | Attending: Thoracic Surgery (Cardiothoracic Vascular Surgery) | Admitting: Thoracic Surgery (Cardiothoracic Vascular Surgery)

## 2014-11-11 VITALS — BP 132/72 | HR 69 | Wt 192.0 lb

## 2014-11-11 VITALS — BP 135/65 | HR 55 | Resp 20 | Ht 62.0 in | Wt 190.0 lb

## 2014-11-11 DIAGNOSIS — Z940 Kidney transplant status: Secondary | ICD-10-CM

## 2014-11-11 DIAGNOSIS — I5020 Unspecified systolic (congestive) heart failure: Secondary | ICD-10-CM | POA: Diagnosis not present

## 2014-11-11 DIAGNOSIS — H9203 Otalgia, bilateral: Secondary | ICD-10-CM

## 2014-11-11 DIAGNOSIS — E1121 Type 2 diabetes mellitus with diabetic nephropathy: Secondary | ICD-10-CM

## 2014-11-11 DIAGNOSIS — I151 Hypertension secondary to other renal disorders: Secondary | ICD-10-CM

## 2014-11-11 DIAGNOSIS — R911 Solitary pulmonary nodule: Secondary | ICD-10-CM

## 2014-11-11 DIAGNOSIS — Z954 Presence of other heart-valve replacement: Secondary | ICD-10-CM

## 2014-11-11 DIAGNOSIS — H9209 Otalgia, unspecified ear: Secondary | ICD-10-CM | POA: Insufficient documentation

## 2014-11-11 DIAGNOSIS — Z953 Presence of xenogenic heart valve: Secondary | ICD-10-CM

## 2014-11-11 MED ORDER — DICLOFENAC SODIUM 1 % TD GEL: 2.0000 g | Freq: Four times a day (QID) | TRANSDERMAL | Status: DC

## 2014-11-11 NOTE — Progress Notes (Signed)
[address]       [place] [address]             [phone]     CARDIOTHORACIC SURGERY OFFICE NOTE  Referring Provider is [name], Denice Bors, MD PCP is Walker Kehr, MD   HPI:  Patient returns for routine follow-up related to small benign appearing incidental lung nodule discovered on previous CT imaging. She is now approximately 1-1/2 years status post aortic valve replacement using a bioprosthetic tissue valve with maze procedure on 04/12/2013. She was last seen here in our office on 05/13/2014. Since then she has remained clinically stable. She describes stable symptoms of exertional shortness of breath that occur only when she is doing moderate activity. She denies any palpitations or other symptoms to suggest a recurrence of atrial fibrillation.   Current Outpatient Prescriptions  Medication Sig Dispense Refill  . Alum & Mag Hydroxide-Simeth (MAGIC MOUTHWASH) SOLN Take 5 mLs by mouth 4 (four) times daily. Swish, hold and swallow 500 mL 1  . amLODipine (NORVASC) 5 MG tablet Take 0.5 tablets (2.5 mg total) by mouth daily. 30 tablet 6  . amoxicillin (AMOXIL) 500 MG tablet Take all four tablets one hour prior to procedure prn 4 tablet 6  . dapsone 25 MG tablet Take 25 mg by mouth daily.     . diphenhydrAMINE (BENADRYL) 50 MG capsule Take 1 capsule (50 mg total) by mouth every 6 (six) hours as needed for itching. 90 capsule 3  . furosemide (LASIX) 80 MG tablet TAKE 1 TABLET BY MOUTH TWICE A DAY (Patient taking differently: TAKE 1/2 TABLET BY MOUTH TWICE A DAY) 60 tablet 1  . gabapentin (NEURONTIN) 100 MG capsule Take 100 mg by mouth 3 (three) times daily.   3  . glipiZIDE (GLUCOTROL XL) 10 MG 24 hr tablet TAKE 1 TABLET BY MOUTH TWICE A DAY 60 tablet 5  . insulin lispro (HUMALOG) 100 UNIT/ML SOPN Inject 8 Units into the skin 4 (four) times daily -  before meals and at bedtime. 12 mL 11  . Insulin Pen Needle 31G X 5 MM MISC Test up to TID dx:250.00 100 each 11    . levothyroxine (LEVOTHROID) 25 MCG tablet Take 2 tablets (50 mcg total) by mouth daily. 60 tablet 11  . metoprolol tartrate (LOPRESSOR) 25 MG tablet TAKE 1 TABLET BY MOUTH TWICE A DAY 60 tablet 7  . mycophenolate (MYFORTIC) 180 MG EC tablet Take 360 mg by mouth 2 (two) times daily.     Marland Kitchen omeprazole (PRILOSEC) 40 MG capsule TAKE ONE CAPSULE BY MOUTH EVERY DAY 30 capsule 3  . ONETOUCH DELICA LANCETS FINE MISC Test up to TID DX:250.00 100 each 11  . ONETOUCH VERIO test strip USE AS INSTRUCTED 4 TIMES A DAY (BEFORE MEALS) & AT BEDTIME (ICD 250.4) 125 each 6  . oxyCODONE (OXY IR/ROXICODONE) 5 MG immediate release tablet Take 1-2 tablets (5-10 mg total) by mouth every 6 (six) hours as needed. 120 tablet 0  . pravastatin (PRAVACHOL) 40 MG tablet TAKE 1 TABLET BY MOUTH EVERY DAY AS DIRECTED (Patient taking differently: take 1 tablet on Mon/Wed/ Fri's) 30 tablet 5  . tacrolimus (PROGRAF) 1 MG capsule Take 4 mg by mouth 2 (two) times daily.     Marland Kitchen triamcinolone cream (KENALOG) 0.1 % Apply 1 application topically 2 (two) times daily. 60 g 3  . warfarin (COUMADIN) 5 MG tablet Take as directed by coumadin clinic 30 tablet 3   No current  facility-administered medications for this visit.      Physical Exam:   BP 135/65 mmHg  Pulse 55  Resp 20  Ht '5\' 2"'$  (1.575 m)  Wt 190 lb (86.183 kg)  BMI 34.74 kg/m2  SpO2 96%  General:  Obese but well appearing  Chest:   Clear to auscultation  CV:   Regular rate and rhythm  Incisions:  Completely healed, sternum is stable  Abdomen:  Soft and nontender  Extremities:  Warm and well-perfused  Diagnostic Tests:  CT CHEST WITHOUT CONTRAST  TECHNIQUE: Multidetector CT imaging of the chest was performed following the standard protocol without IV contrast.  COMPARISON: 07/31/2014. 04/15/2013.  FINDINGS: Ground-glass nodular opacity again noted in the right upper lobe measuring 10 mm in greatest diameter, stable dating back to 2014. Linear density noted  at the right lung base is stable since most recent study compatible with scarring. No new pulmonary nodules. No effusions.  Prior CABG and valve replacement. Aorta is calcified, non aneurysmal. Mild ectasia of the descending thoracic aorta. No mediastinal, hilar, or axillary adenopathy. Chest wall soft tissues are unremarkable. Imaging into the upper abdomen shows no acute findings.  Degenerative changes in the thoracic spine. No acute bony abnormality.  IMPRESSION: Ground-glass nodular density in the right upper lobe is stable dating back to 2014 recommend additional followup CT in 12-18 months for documented stability over 3 years.   Electronically Signed  By: Rolm Baptise M.D.  On: 11/11/2014 13:12   Impression:  Stable groundglass nodular density in the right upper lobe.  This area measures 10 mm in its greatest dimension and has been stable for 2 years. The patient is otherwise clinically stable and continues to maintain sinus rhythm.  She describes stable symptoms of mild exertional shortness of breath consistent with chronic diastolic congestive heart failure, New York Heart Association functional class II.     Plan:  The patient will return in 1 year for one final CT scan to document stability of this incidental lung nodule over a three-year period of time. She will continue to follow-up with Dr. [name] and Dr. Alain Marion. She will be followed intermittently in the atrial fibrillation clinic for surveillance following maze procedure.  I spent in excess of 15 minutes during the conduct of this office consultation and >50% of this time involved direct face-to-face encounter with the patient for counseling and/or coordination of their care.   Valentina Gu. Roxy Manns, MD 11/11/2014 1:41 PM

## 2014-11-11 NOTE — Patient Instructions (Signed)
Temporomandibular Problems  Temporomandibular joint (TMJ) dysfunction means there are problems with the joint between your jaw and your skull. This is a joint lined by cartilage like other joints in your body but also has a small disc in the joint which keeps the bones from rubbing on each other. These joints are like other joints and can get inflamed (sore) from arthritis and other problems. When this joint gets sore, it can cause headaches and pain in the jaw and the face. CAUSES  Usually the arthritic types of problems are caused by soreness in the joint. Soreness in the joint can also be caused by overuse. This may come from grinding your teeth. It may also come from mis-alignment in the joint. DIAGNOSIS Diagnosis of this condition can often be made by history and exam. Sometimes your caregiver may need X-rays or an MRI scan to determine the exact cause. It may be necessary to see your dentist to determine if your teeth and jaws are lined up correctly. TREATMENT  Most of the time this problem is not serious; however, sometimes it can persist (become chronic). When this happens medications that will cut down on inflammation (soreness) help. Sometimes a shot of cortisone into the joint will be helpful. If your teeth are not aligned it may help for your dentist to make a splint for your mouth that can help this problem. If no physical problems can be found, the problem may come from tension. If tension is found to be the cause, biofeedback or relaxation techniques may be helpful. HOME CARE INSTRUCTIONS   Later in the day, applications of ice packs may be helpful. Ice can be used in a plastic bag with a towel around it to prevent frostbite to skin. This may be used about every 2 hours for 20 to 30 minutes, as needed while awake, or as directed by your caregiver.  Only take over-the-counter or prescription medicines for pain, discomfort, or fever as directed by your caregiver.  If physical therapy was  prescribed, follow your caregiver's directions.  Wear mouth appliances as directed if they were given. Document Released: 03/30/2001 Document Revised: 09/27/2011 Document Reviewed: 07/07/2008 ExitCare Patient Information 2015 [employer]. This information is not intended to replace advice given to you by your health care provider. Make sure you discuss any questions you have with your health care provider.  

## 2014-11-11 NOTE — Patient Instructions (Signed)
Schedule follow up appointment in [facility] in 6 months

## 2014-11-11 NOTE — Assessment & Plan Note (Signed)
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

## 2014-11-11 NOTE — Progress Notes (Signed)
Subjective:   C/o B ear discomfort F/u chronic B feet burning - not worse  Labs q 1 mo w/Nephrology  [name] is a 69 y/o F with history of CAD s/p MI/RCA stent 2005, aortic stenosis, embolic CVA 6948, PAF on Coumadin, DM, pericardial effusion, and CKD (ESRD due to HTN/DM -> HD then renal transplant 2012 followed at [facility]) .  Pt with severe aortic stenosis now s/p AVR on 04/11/13.  F/u elev CBG: using Insulin tid - better CBGs. No low CBGs  Novolog pen is not covered, Humalog is covered   HPI  The patient presents for a follow-up of  Chronic ESRD, hypertension, chronic dyslipidemia, type 2 diabetes not controlled with medicines. She just had a renal tranplant in 8/12. F/u itching and rash - not better.   Declined a flu shot     BP Readings from Last 3 Encounters:  11/11/14 132/72  11/11/14 135/65  08/27/14 132/64   Wt Readings from Last 3 Encounters:  11/11/14 192 lb (87.091 kg)  11/11/14 190 lb (86.183 kg)  08/12/14 195 lb (88.451 kg)     Review of Systems  Constitutional: Negative for chills, activity change, appetite change and unexpected weight change.  HENT: Negative for mouth sores and sinus pressure.   Eyes: Negative for visual disturbance.  Respiratory: Negative for chest tightness.   Gastrointestinal: Negative for nausea and abdominal pain.  Genitourinary: Negative for frequency, difficulty urinating and vaginal pain.  Musculoskeletal: Positive for arthralgias and gait problem. Negative for back pain.  Skin: Positive for color change and rash.       pruritis  Neurological: Positive for numbness.  Psychiatric/Behavioral: Negative for sleep disturbance.       Objective:   Physical Exam  Constitutional: No distress.  Looks much better  HENT:  Head: Normocephalic.  Right Ear: External ear normal.  Left Ear: External ear normal.  Nose: Nose normal.  Mouth/Throat: Oropharynx is clear and moist.  Eyes: Conjunctivae are normal. Pupils are equal, round,  and reactive to light. Right eye exhibits no discharge. Left eye exhibits no discharge.  Neck: Normal range of motion. Neck supple. No JVD present. No tracheal deviation present. No thyromegaly present.  Cardiovascular: Normal rate and regular rhythm.  Exam reveals no gallop and no friction rub.   Murmur (2/6) heard. Pulmonary/Chest: No stridor. No respiratory distress. She has no wheezes.  Abdominal: Soft. Bowel sounds are normal. She exhibits no distension and no mass. There is no tenderness. There is no rebound and no guarding.  Musculoskeletal: She exhibits no edema or tenderness.  Walking w/a cane  Lymphadenopathy:    She has no cervical adenopathy.  Neurological: She displays normal reflexes. No cranial nerve deficit. She exhibits normal muscle tone. Coordination normal.  Skin: Rash (hives and hyperpigmentation on LE and UEs B) noted. No erythema.  Scar on chest and   Psychiatric: She has a normal mood and affect. Her behavior is normal. Thought content normal.  Distal feet are tender B Graft in L arm seems to be ok B TMJ - tender  Lab Results  Component Value Date   WBC 6.4 01/09/2014   HGB 11.8* 01/09/2014   HCT 37.1 01/09/2014   PLT 213.0 01/09/2014   GLUCOSE 200* 04/12/2014   CHOL 164 04/06/2013   TRIG 125 04/06/2013   HDL 73 04/06/2013   LDLDIRECT 89.3 06/13/2006   LDLCALC 66 04/06/2013   ALT 24 01/09/2014   AST 23 01/09/2014   NA 138 04/12/2014   K  3.7 04/12/2014   CL 103 04/12/2014   CREATININE 1.6* 04/12/2014   BUN 27* 04/12/2014   CO2 30 04/12/2014   TSH 5.47* 01/09/2014   INR 2.0 11/05/2014   HGBA1C 7.9* 04/12/2014   MICROALBUR 168.6* 10/31/2006       Assessment & Plan:

## 2014-11-25 ENCOUNTER — Other Ambulatory Visit: Payer: Self-pay | Admitting: Cardiology

## 2014-11-28 DIAGNOSIS — N189 Chronic kidney disease, unspecified: Secondary | ICD-10-CM | POA: Diagnosis not present

## 2014-11-28 DIAGNOSIS — Z940 Kidney transplant status: Secondary | ICD-10-CM | POA: Diagnosis not present

## 2014-11-28 DIAGNOSIS — Z79899 Other long term (current) drug therapy: Secondary | ICD-10-CM | POA: Diagnosis not present

## 2014-11-28 NOTE — Assessment & Plan Note (Signed)
2012

## 2014-11-28 NOTE — Assessment & Plan Note (Signed)
Glipizide, Humalog

## 2014-11-28 NOTE — Assessment & Plan Note (Signed)
Metoprolol, Furosemide 

## 2014-11-28 NOTE — Assessment & Plan Note (Signed)
Amlodipine, Metoprolol, Furosemide 

## 2014-12-17 ENCOUNTER — Ambulatory Visit (INDEPENDENT_AMBULATORY_CARE_PROVIDER_SITE_OTHER): Payer: Medicare Other

## 2014-12-17 DIAGNOSIS — Z7901 Long term (current) use of anticoagulants: Secondary | ICD-10-CM | POA: Diagnosis not present

## 2014-12-17 DIAGNOSIS — Z5181 Encounter for therapeutic drug level monitoring: Secondary | ICD-10-CM | POA: Diagnosis not present

## 2014-12-17 DIAGNOSIS — I4891 Unspecified atrial fibrillation: Secondary | ICD-10-CM | POA: Diagnosis not present

## 2014-12-17 LAB — POCT INR: INR: 2.3

## 2014-12-24 DIAGNOSIS — Z940 Kidney transplant status: Secondary | ICD-10-CM | POA: Diagnosis not present

## 2014-12-24 DIAGNOSIS — R1310 Dysphagia, unspecified: Secondary | ICD-10-CM | POA: Diagnosis not present

## 2014-12-24 DIAGNOSIS — D899 Disorder involving the immune mechanism, unspecified: Secondary | ICD-10-CM | POA: Diagnosis not present

## 2014-12-24 DIAGNOSIS — I10 Essential (primary) hypertension: Secondary | ICD-10-CM | POA: Diagnosis not present

## 2015-01-10 ENCOUNTER — Other Ambulatory Visit: Payer: Self-pay | Admitting: *Deleted

## 2015-01-10 MED ORDER — GABAPENTIN 100 MG PO CAPS: 100.0000 mg | ORAL_CAPSULE | Freq: Three times a day (TID) | ORAL | Status: DC

## 2015-01-10 MED ORDER — INSULIN LISPRO 100 UNIT/ML (KWIKPEN): 8.0000 [IU] | PEN_INJECTOR | Freq: Three times a day (TID) | SUBCUTANEOUS | Status: DC

## 2015-01-10 NOTE — Addendum Note (Signed)
Addended by: Cresenciano Lick on: 01/10/2015 04:51 PM   Modules accepted: Orders, Medications

## 2015-01-13 ENCOUNTER — Other Ambulatory Visit: Payer: Self-pay

## 2015-01-28 ENCOUNTER — Ambulatory Visit (INDEPENDENT_AMBULATORY_CARE_PROVIDER_SITE_OTHER): Payer: Medicare Other | Admitting: *Deleted

## 2015-01-28 DIAGNOSIS — Z7901 Long term (current) use of anticoagulants: Secondary | ICD-10-CM

## 2015-01-28 DIAGNOSIS — I4891 Unspecified atrial fibrillation: Secondary | ICD-10-CM

## 2015-01-28 DIAGNOSIS — Z5181 Encounter for therapeutic drug level monitoring: Secondary | ICD-10-CM

## 2015-01-28 LAB — POCT INR: INR: 1.7

## 2015-02-10 ENCOUNTER — Ambulatory Visit (INDEPENDENT_AMBULATORY_CARE_PROVIDER_SITE_OTHER): Payer: Medicare Other | Admitting: Internal Medicine

## 2015-02-10 ENCOUNTER — Encounter: Payer: Self-pay | Admitting: Internal Medicine

## 2015-02-10 VITALS — BP 160/90 | HR 72 | Wt 192.0 lb

## 2015-02-10 DIAGNOSIS — I2510 Atherosclerotic heart disease of native coronary artery without angina pectoris: Secondary | ICD-10-CM

## 2015-02-10 DIAGNOSIS — M5440 Lumbago with sciatica, unspecified side: Secondary | ICD-10-CM

## 2015-02-10 DIAGNOSIS — E1121 Type 2 diabetes mellitus with diabetic nephropathy: Secondary | ICD-10-CM

## 2015-02-10 DIAGNOSIS — M1000 Idiopathic gout, unspecified site: Secondary | ICD-10-CM

## 2015-02-10 DIAGNOSIS — I480 Paroxysmal atrial fibrillation: Secondary | ICD-10-CM

## 2015-02-10 DIAGNOSIS — R5382 Chronic fatigue, unspecified: Secondary | ICD-10-CM

## 2015-02-10 MED ORDER — PREDNISONE 10 MG PO TABS: ORAL_TABLET | ORAL | Status: DC

## 2015-02-10 MED ORDER — OXYCODONE HCL 5 MG PO TABS: 5.0000 mg | ORAL_TABLET | Freq: Four times a day (QID) | ORAL | Status: DC | PRN

## 2015-02-10 NOTE — Progress Notes (Signed)
Subjective:  [name], female    DOB: 1945/12/16  Age: 69 y.o. MRN: [id number]  CC: No chief complaint on file.   HPI [name] presents for HTN, DM, anticoagulation f/u. C/o stress - taking care of a 2 mo old baby...  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 Pt with severe aortic stenosis now s/p AVR on 04/11/13.  F/u elev CBG: using Insulin tid - better CBGs. No low CBGs  Outpatient Prescriptions Prior to Visit  Medication Sig Dispense Refill  . Alum & Mag Hydroxide-Simeth (MAGIC MOUTHWASH) SOLN Take 5 mLs by mouth 4 (four) times daily. Swish, hold and swallow 500 mL 1  . amLODipine (NORVASC) 5 MG tablet Take 0.5 tablets (2.5 mg total) by mouth daily. 30 tablet 6  . amoxicillin (AMOXIL) 500 MG tablet Take all four tablets one hour prior to procedure prn 4 tablet 6  . dapsone 25 MG tablet Take 25 mg by mouth daily.     . diclofenac sodium (VOLTAREN) 1 % GEL Apply 2 g topically 4 (four) times daily. 300 g 3  . diphenhydrAMINE (BENADRYL) 50 MG capsule Take 1 capsule (50 mg total) by mouth every 6 (six) hours as needed for itching. 90 capsule 3  . furosemide (LASIX) 80 MG tablet TAKE 1 TABLET BY MOUTH TWICE A DAY (Patient taking differently: TAKE 1/2 TABLET BY MOUTH TWICE A DAY) 60 tablet 1  . gabapentin (NEURONTIN) 100 MG capsule Take 1 capsule (100 mg total) by mouth 3 (three) times daily. 90 capsule 5  . glipiZIDE (GLUCOTROL XL) 10 MG 24 hr tablet TAKE 1 TABLET BY MOUTH TWICE A DAY 60 tablet 5  . insulin lispro (HUMALOG) 100 UNIT/ML KiwkPen Inject 0.08 mLs (8 Units total) into the skin 4 (four) times daily -  before meals and at bedtime. 15 mL 11  . Insulin Pen Needle 31G X 5 MM MISC Test up to TID dx:250.00 100 each 11  . levothyroxine (LEVOTHROID) 25 MCG tablet Take 2 tablets (50  mcg total) by mouth daily. 60 tablet 11  . metoprolol tartrate (LOPRESSOR) 25 MG tablet TAKE 1 TABLET BY MOUTH TWICE A DAY 60 tablet 7  . mycophenolate (MYFORTIC) 180 MG EC tablet Take 360 mg by mouth 2 (two) times daily.     Marland Kitchen omeprazole (PRILOSEC) 40 MG capsule TAKE ONE CAPSULE BY MOUTH EVERY DAY 30 capsule 3  . ONETOUCH VERIO test strip USE AS INSTRUCTED 4 TIMES A DAY (BEFORE MEALS) & AT BEDTIME (ICD 250.4) 125 each 6  . pravastatin (PRAVACHOL) 40 MG tablet TAKE 1 TABLET BY MOUTH EVERY DAY AS DIRECTED (Patient taking differently: take 1 tablet on Mon/Wed/ Fri's) 30 tablet 5  . tacrolimus (PROGRAF) 1 MG capsule Take 4 mg by mouth 2 (two) times daily.     Marland Kitchen triamcinolone cream (KENALOG) 0.1 % Apply 1 application topically 2 (two) times daily. 60 g 3  . warfarin (COUMADIN) 5 MG tablet TAKE AS DIRECTED BY COUMADIN CLINIC 30 tablet 6  . oxyCODONE (OXY IR/ROXICODONE) 5 MG immediate release tablet Take 1-2 tablets (5-10 mg total) by mouth every 6 (six) hours as needed. 120 tablet 0  . nystatin (MYCOSTATIN) 100000 UNIT/ML suspension 3 (three) times daily.  98   No facility-administered medications prior to visit.  ROS Review of Systems  Constitutional: Positive for fatigue. Negative for chills, activity change, appetite change and unexpected weight change.  HENT: Positive for congestion and drooling. Negative for mouth sores, sinus pressure and trouble swallowing.   Eyes: Negative for photophobia, itching and visual disturbance.  Respiratory: Negative for cough and chest tightness.   Cardiovascular: Negative for leg swelling.  Gastrointestinal: Negative for nausea and abdominal pain.  Genitourinary: Negative for frequency, difficulty urinating and vaginal pain.  Musculoskeletal: Negative for back pain and gait problem.  Skin: Negative for pallor and rash.  Neurological: Negative for dizziness, tremors, seizures, weakness, numbness and headaches.  Psychiatric/Behavioral: Negative for confusion  and sleep disturbance. The patient is nervous/anxious.     Objective:  BP 160/90 mmHg  Pulse 72  Wt 192 lb (87.091 kg)  SpO2 94%  BP Readings from Last 3 Encounters:  02/10/15 160/90  11/11/14 132/72  11/11/14 135/65    Wt Readings from Last 3 Encounters:  02/10/15 192 lb (87.091 kg)  11/11/14 192 lb (87.091 kg)  11/11/14 190 lb (86.183 kg)    Physical Exam  Constitutional: She appears well-developed. No distress.  HENT:  Head: Normocephalic.  Right Ear: External ear normal.  Left Ear: External ear normal.  Nose: Nose normal.  Mouth/Throat: Oropharynx is clear and moist.  Eyes: Conjunctivae are normal. Pupils are equal, round, and reactive to light. Right eye exhibits no discharge. Left eye exhibits no discharge.  Neck: Normal range of motion. Neck supple. No JVD present. No tracheal deviation present. No thyromegaly present.  Cardiovascular: Normal rate, regular rhythm and normal heart sounds.   Pulmonary/Chest: No stridor. No respiratory distress. She has no wheezes.  Abdominal: Soft. Bowel sounds are normal. She exhibits no distension and no mass. There is no tenderness. There is no rebound and no guarding.  Musculoskeletal: She exhibits no edema or tenderness.  Lymphadenopathy:    She has no cervical adenopathy.  Neurological: She displays normal reflexes. No cranial nerve deficit. She exhibits normal muscle tone. Coordination normal.  Skin: No rash noted. No erythema.  Psychiatric: She has a normal mood and affect. Her behavior is normal. Judgment and thought content normal.  R 1st MTP is tender  Lab Results  Component Value Date   WBC 6.4 01/09/2014   HGB 11.8* 01/09/2014   HCT 37.1 01/09/2014   PLT 213.0 01/09/2014   GLUCOSE 200* 04/12/2014   CHOL 164 04/06/2013   TRIG 125 04/06/2013   HDL 73 04/06/2013   LDLDIRECT 89.3 06/13/2006   LDLCALC 66 04/06/2013   ALT 24 01/09/2014   AST 23 01/09/2014   NA 138 04/12/2014   K 3.7 04/12/2014   CL 103 04/12/2014     CREATININE 1.6* 04/12/2014   BUN 27* 04/12/2014   CO2 30 04/12/2014   TSH 5.47* 01/09/2014   INR 1.7 01/28/2015   HGBA1C 7.9* 04/12/2014   MICROALBUR 168.6* 10/31/2006    Ct Chest Wo Contrast  11/11/2014   CLINICAL DATA:  Followup lung nodule.  EXAM: CT CHEST WITHOUT CONTRAST  TECHNIQUE: Multidetector CT imaging of the chest was performed following the standard protocol without IV contrast.  COMPARISON:  07/31/2014.  04/15/2013.  FINDINGS: Ground-glass nodular opacity again noted in the right upper lobe measuring 10 mm in greatest diameter, stable dating back to 2014. Linear density noted at the right lung base is stable since most recent study compatible with scarring. No new pulmonary nodules. No effusions.  Prior CABG and valve replacement. Aorta is calcified, non aneurysmal. Mild  ectasia of the descending thoracic aorta. No mediastinal, hilar, or axillary adenopathy. Chest wall soft tissues are unremarkable. Imaging into the upper abdomen shows no acute findings.  Degenerative changes in the thoracic spine. No acute bony abnormality.  IMPRESSION: Ground-glass nodular density in the right upper lobe is stable dating back to 2014 recommend additional followup CT in 12-18 months for documented stability over 3 years.   Electronically Signed   By: Rolm Baptise M.D.   On: 11/11/2014 13:12    Assessment & Plan:   Diagnoses and all orders for this visit:  Atherosclerosis of native coronary artery of native heart without angina pectoris  Paroxysmal atrial fibrillation  Type 2 diabetes mellitus with diabetic nephropathy  Idiopathic gout, unspecified chronicity, unspecified site  Chronic fatigue  Other orders -     oxyCODONE (OXY IR/ROXICODONE) 5 MG immediate release tablet; Take 1-2 tablets (5-10 mg total) by mouth every 6 (six) hours as needed. -     predniSONE (DELTASONE) 10 MG tablet; Prednisone 10 mg: take  3 tabs a day x 4 days; then 2 tabs a day x 2 days,then stop prn gout. Take pc.  Use prn gout.  I am having [name] start on predniSONE. I am also having her maintain her mycophenolate, tacrolimus, dapsone, diphenhydrAMINE, Insulin Pen Needle, magic mouthwash, levothyroxine, ONETOUCH VERIO, omeprazole, amLODipine, furosemide, amoxicillin, triamcinolone cream, pravastatin, glipiZIDE, metoprolol tartrate, nystatin, diclofenac sodium, warfarin, gabapentin, insulin lispro, amoxicillin, and oxyCODONE.  Meds ordered this encounter  Medications  . amoxicillin (AMOXIL) 500 MG capsule    Sig: TAKE 4 CAPLETS BY MOUTH 1 HOUR PRIOR TO PRECEDURE AS NEEDED    Refill:  6  . oxyCODONE (OXY IR/ROXICODONE) 5 MG immediate release tablet    Sig: Take 1-2 tablets (5-10 mg total) by mouth every 6 (six) hours as needed.    Dispense:  120 tablet    Refill:  0  . predniSONE (DELTASONE) 10 MG tablet    Sig: Prednisone 10 mg: take  3 tabs a day x 4 days; then 2 tabs a day x 2 days,then stop prn gout. Take pc. Use prn gout.    Dispense:  60 tablet    Refill:  1     Follow-up: No Follow-up on file.  Walker Kehr, MD

## 2015-02-10 NOTE — Progress Notes (Signed)
Pre visit review using our clinic review tool, if applicable. No additional management support is needed unless otherwise documented below in the visit note. 

## 2015-02-10 NOTE — Assessment & Plan Note (Signed)
Oxycodone prn -   Potential benefits of a long term oxycodone use as well as potential risks (i.e. addiction risk (low), apnea etc) and complications (i.e. Somnolence, seizures, constipation and others) were explained to the patient and were aknowledged. 

## 2015-02-10 NOTE — Assessment & Plan Note (Signed)
On Coumadin 

## 2015-02-10 NOTE — Assessment & Plan Note (Signed)
Discussed.

## 2015-02-10 NOTE — Assessment & Plan Note (Signed)
Chronic  Glipizide, Humalog

## 2015-02-10 NOTE — Assessment & Plan Note (Signed)
Worse Prednisone 10 mg: take  3 tabs a day x 4 days; then 2 tabs a day x 2 days,then stop prn gout. Take pc.

## 2015-02-10 NOTE — Assessment & Plan Note (Signed)
Chronic  Amlodipine, Pravastatin

## 2015-02-11 ENCOUNTER — Ambulatory Visit (INDEPENDENT_AMBULATORY_CARE_PROVIDER_SITE_OTHER): Payer: Medicare Other | Admitting: Pharmacist Clinician (PhC)/ Clinical Pharmacy Specialist

## 2015-02-11 DIAGNOSIS — I480 Paroxysmal atrial fibrillation: Secondary | ICD-10-CM

## 2015-02-11 DIAGNOSIS — Z5181 Encounter for therapeutic drug level monitoring: Secondary | ICD-10-CM

## 2015-02-11 DIAGNOSIS — I4891 Unspecified atrial fibrillation: Secondary | ICD-10-CM

## 2015-02-11 DIAGNOSIS — Z7901 Long term (current) use of anticoagulants: Secondary | ICD-10-CM | POA: Diagnosis not present

## 2015-02-11 LAB — POCT INR: INR: 2.2

## 2015-02-18 ENCOUNTER — Ambulatory Visit (INDEPENDENT_AMBULATORY_CARE_PROVIDER_SITE_OTHER): Payer: Medicare Other | Admitting: *Deleted

## 2015-02-18 DIAGNOSIS — I4891 Unspecified atrial fibrillation: Secondary | ICD-10-CM | POA: Diagnosis not present

## 2015-02-18 DIAGNOSIS — Z5181 Encounter for therapeutic drug level monitoring: Secondary | ICD-10-CM

## 2015-02-18 DIAGNOSIS — Z7901 Long term (current) use of anticoagulants: Secondary | ICD-10-CM | POA: Diagnosis not present

## 2015-02-18 DIAGNOSIS — I480 Paroxysmal atrial fibrillation: Secondary | ICD-10-CM

## 2015-02-18 LAB — POCT INR: INR: 2.1

## 2015-03-11 ENCOUNTER — Ambulatory Visit (INDEPENDENT_AMBULATORY_CARE_PROVIDER_SITE_OTHER): Payer: Medicare Other | Admitting: Pharmacist

## 2015-03-11 DIAGNOSIS — I4891 Unspecified atrial fibrillation: Secondary | ICD-10-CM

## 2015-03-11 DIAGNOSIS — Z5181 Encounter for therapeutic drug level monitoring: Secondary | ICD-10-CM | POA: Diagnosis not present

## 2015-03-11 DIAGNOSIS — I480 Paroxysmal atrial fibrillation: Secondary | ICD-10-CM | POA: Diagnosis not present

## 2015-03-11 DIAGNOSIS — Z7901 Long term (current) use of anticoagulants: Secondary | ICD-10-CM

## 2015-03-11 LAB — POCT INR: INR: 2.2

## 2015-03-18 DIAGNOSIS — D899 Disorder involving the immune mechanism, unspecified: Secondary | ICD-10-CM | POA: Diagnosis not present

## 2015-03-18 DIAGNOSIS — E1122 Type 2 diabetes mellitus with diabetic chronic kidney disease: Secondary | ICD-10-CM | POA: Diagnosis not present

## 2015-03-18 DIAGNOSIS — Z794 Long term (current) use of insulin: Secondary | ICD-10-CM | POA: Diagnosis not present

## 2015-03-18 DIAGNOSIS — I120 Hypertensive chronic kidney disease with stage 5 chronic kidney disease or end stage renal disease: Secondary | ICD-10-CM | POA: Diagnosis not present

## 2015-03-18 DIAGNOSIS — L819 Disorder of pigmentation, unspecified: Secondary | ICD-10-CM | POA: Diagnosis not present

## 2015-03-18 DIAGNOSIS — Z0000 Encounter for general adult medical examination without abnormal findings: Secondary | ICD-10-CM | POA: Diagnosis not present

## 2015-03-18 DIAGNOSIS — E1159 Type 2 diabetes mellitus with other circulatory complications: Secondary | ICD-10-CM | POA: Diagnosis not present

## 2015-03-18 DIAGNOSIS — Z940 Kidney transplant status: Secondary | ICD-10-CM | POA: Diagnosis not present

## 2015-03-18 DIAGNOSIS — Z79899 Other long term (current) drug therapy: Secondary | ICD-10-CM | POA: Diagnosis not present

## 2015-03-18 DIAGNOSIS — J029 Acute pharyngitis, unspecified: Secondary | ICD-10-CM | POA: Diagnosis not present

## 2015-03-18 DIAGNOSIS — L299 Pruritus, unspecified: Secondary | ICD-10-CM | POA: Diagnosis not present

## 2015-03-18 DIAGNOSIS — I10 Essential (primary) hypertension: Secondary | ICD-10-CM | POA: Diagnosis not present

## 2015-03-18 DIAGNOSIS — K1370 Unspecified lesions of oral mucosa: Secondary | ICD-10-CM | POA: Diagnosis not present

## 2015-03-18 DIAGNOSIS — N186 End stage renal disease: Secondary | ICD-10-CM | POA: Diagnosis not present

## 2015-03-18 DIAGNOSIS — Z952 Presence of prosthetic heart valve: Secondary | ICD-10-CM | POA: Diagnosis not present

## 2015-03-21 DIAGNOSIS — Z940 Kidney transplant status: Secondary | ICD-10-CM | POA: Diagnosis not present

## 2015-03-25 ENCOUNTER — Other Ambulatory Visit: Payer: Self-pay

## 2015-03-25 ENCOUNTER — Other Ambulatory Visit: Payer: Self-pay | Admitting: *Deleted

## 2015-03-25 DIAGNOSIS — Z1231 Encounter for screening mammogram for malignant neoplasm of breast: Secondary | ICD-10-CM

## 2015-03-25 MED ORDER — LEVOTHYROXINE SODIUM 25 MCG PO TABS: 50.0000 ug | ORAL_TABLET | Freq: Every day | ORAL | Status: DC

## 2015-03-26 DIAGNOSIS — D899 Disorder involving the immune mechanism, unspecified: Secondary | ICD-10-CM | POA: Diagnosis not present

## 2015-03-26 DIAGNOSIS — Z940 Kidney transplant status: Secondary | ICD-10-CM | POA: Diagnosis not present

## 2015-03-26 DIAGNOSIS — I10 Essential (primary) hypertension: Secondary | ICD-10-CM | POA: Diagnosis not present

## 2015-03-28 ENCOUNTER — Encounter (HOSPITAL_COMMUNITY): Payer: Self-pay

## 2015-04-01 IMAGING — DX DG CHEST 1V PORT
1 series · 1 of 1 positions shown · non-contrast
Comparison: April 07, 2013

CLINICAL DATA: Central catheter placement

EXAM:
PORTABLE CHEST - 1 VIEW

[portable]
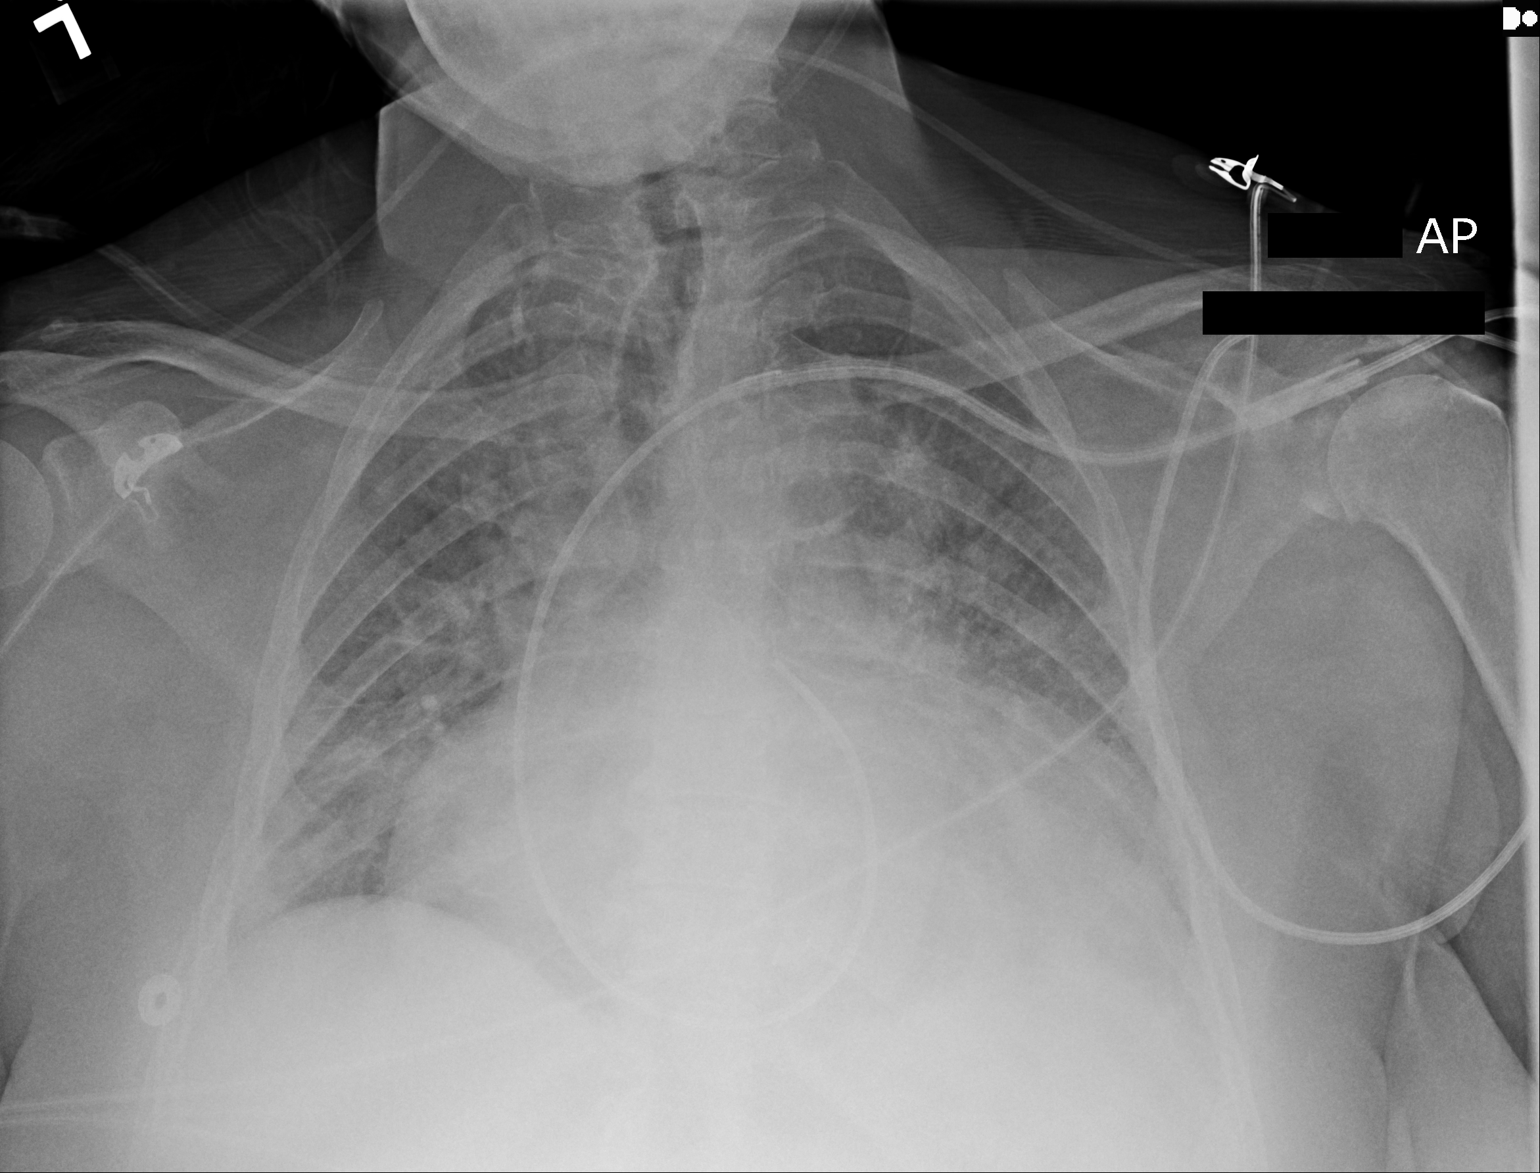

[1 of 1 positions shown; findings below may reference images not displayed]

FINDINGS: Central catheter tip is in the main pulmonary outflow tract. No
pneumothorax. There is cardiomegaly with mild interstitial edema.
There is a minimal left effusion. There is no airspace
consolidation. .
IMPRESSION: Central catheter tip in proximal main pulmonary outflow tract. No
pneumothorax. Evidence of a degree of congestive heart failure.

## 2015-04-02 IMAGING — CR DG CHEST 1V PORT
1 series · 1 of 1 positions shown · non-contrast
Comparison: 04/11/2013

CLINICAL DATA: Postop

EXAM:
PORTABLE CHEST - 1 VIEW

[AP]
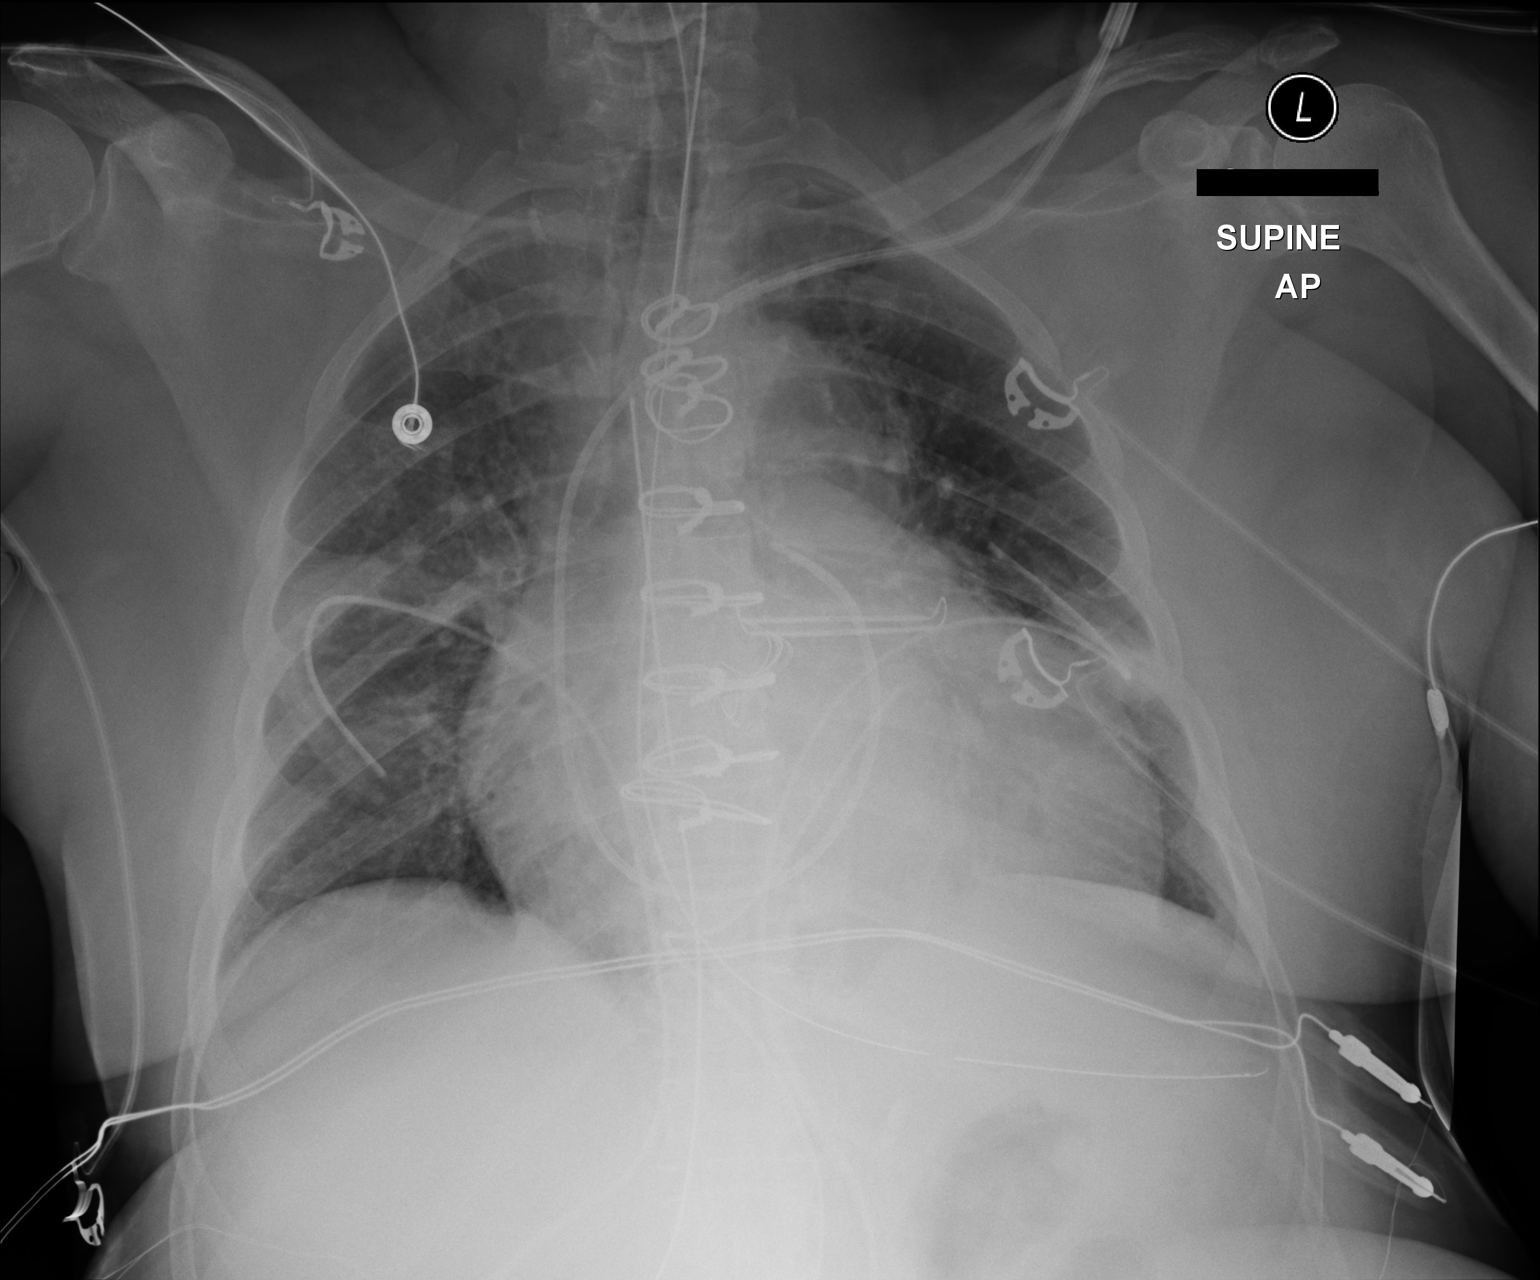

[1 of 1 positions shown; findings below may reference images not displayed]

FINDINGS: Moderate cardiomegaly. Pulmonary edema resolved. No pneumothorax.

Endotracheal tube tip 2.4 cm necrotic. Left subclavian vein
introducer and [place] tip in the central right pulmonary artery.
Several chest tubes are in place. Low volumes. NG tube tip in the
fundus of the stomach.
IMPRESSION: Postop.

No edema or pneumothorax.

Endotracheal tube in appropriate position.

## 2015-04-15 ENCOUNTER — Ambulatory Visit (INDEPENDENT_AMBULATORY_CARE_PROVIDER_SITE_OTHER): Payer: Medicare Other

## 2015-04-15 DIAGNOSIS — Z7901 Long term (current) use of anticoagulants: Secondary | ICD-10-CM

## 2015-04-15 DIAGNOSIS — I480 Paroxysmal atrial fibrillation: Secondary | ICD-10-CM

## 2015-04-15 DIAGNOSIS — Z5181 Encounter for therapeutic drug level monitoring: Secondary | ICD-10-CM

## 2015-04-15 DIAGNOSIS — I4891 Unspecified atrial fibrillation: Secondary | ICD-10-CM

## 2015-04-15 LAB — POCT INR: INR: 2.7

## 2015-04-16 DIAGNOSIS — E119 Type 2 diabetes mellitus without complications: Secondary | ICD-10-CM | POA: Diagnosis not present

## 2015-04-16 DIAGNOSIS — H3582 Retinal ischemia: Secondary | ICD-10-CM | POA: Diagnosis not present

## 2015-04-16 DIAGNOSIS — H34231 Retinal artery branch occlusion, right eye: Secondary | ICD-10-CM | POA: Diagnosis not present

## 2015-04-22 ENCOUNTER — Ambulatory Visit
Admission: RE | Admit: 2015-04-22 | Discharge: 2015-04-22 | Disposition: A | Discharge status: Home / Self Care | Payer: Medicare Other | Source: Ambulatory Visit

## 2015-04-22 DIAGNOSIS — Z1231 Encounter for screening mammogram for malignant neoplasm of breast: Secondary | ICD-10-CM | POA: Diagnosis not present

## 2015-04-29 ENCOUNTER — Ambulatory Visit (HOSPITAL_COMMUNITY)
Admission: RE | Admit: 2015-04-29 | Discharge: 2015-04-29 | Disposition: A | Discharge status: Home / Self Care | Payer: Medicare Other | Source: Ambulatory Visit | Attending: Nurse Practitioner | Admitting: Nurse Practitioner

## 2015-04-29 ENCOUNTER — Encounter (HOSPITAL_COMMUNITY): Payer: Self-pay | Admitting: Nurse Practitioner

## 2015-04-29 VITALS — BP 148/74 | HR 62 | Ht 62.0 in | Wt 187.0 lb

## 2015-04-29 DIAGNOSIS — Z954 Presence of other heart-valve replacement: Secondary | ICD-10-CM | POA: Diagnosis not present

## 2015-04-29 DIAGNOSIS — I4891 Unspecified atrial fibrillation: Secondary | ICD-10-CM | POA: Insufficient documentation

## 2015-04-29 DIAGNOSIS — I10 Essential (primary) hypertension: Secondary | ICD-10-CM | POA: Diagnosis not present

## 2015-04-29 NOTE — Progress Notes (Signed)
[name], female   DOB: [birth year], 69 y.o.   MRN: [id number]     Primary Care Physician: Walker Kehr, MD Referring Physician: Dr. [name] Kruck is a 69 y.o. female with a h/o approximately 1-1/2 years status post aortic valve replacement using a bioprosthetic tissue valve with maze procedure on 04/12/2013.  Since then she has remained clinically stable. She describes stable symptoms of exertional shortness of breath that occur only when she is doing moderate activity. She denies any palpitations or other symptoms to suggest a recurrence of atrial fibrillation. She continues on warfarin. She is also being followed for a stable lung nodule by Dr. Roxy Manns.  Today, she denies symptoms of palpitations, chest pain, shortness of breath, orthopnea, PND, lower extremity edema, dizziness, presyncope, syncope, or neurologic sequela. The patient is tolerating medications without difficulties and is otherwise without complaint today.   Past Medical History  Diagnosis Date  . Coronary artery disease 05/2002    a. Ant MI 2003 s/p PTCA/stent to RCA.   Marland Kitchen Hypertension   . Hyperlipidemia   . CHF (congestive heart failure) ([place])   . Pericardial effusion     a. Small by echo 11/2011.  Marland Kitchen GERD (gastroesophageal reflux disease)   . 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 Unspecified hearing loss   . Anemia, iron deficiency     of chronic disease  . Helicobacter pylori (H. pylori) infection     hx of  . Esophagitis, reflux   . Gout   . Cholelithiasis   . Hx of colonic polyps     adenomatous  . Diverticulosis of colon   . 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  Marland Kitchen Chronic cough onset 03/2010    Dr Melvyn Novas  . Carotid artery disease ([place])     a. Carotid Dopplers performed in August 2013 showed 40-59% left  stenosis and 0-39% right; f/u recommended in 2 years.   . Aortic stenosis     a. Severe AS by echo 11/2012.  . Asystole ([place])     a. During ENT surgery 2005: developed marked asystole requiring CPR, felt due to vagal reaction (cath nonobst dz).  . Myocardial infarction ([place]) 2003  . Cerebrovascular accident [facility]) 2009    a. LMCA infarct felt embolic 4098, maintained on chronic coumadin.; denies residual on 04/05/2013  . Sleep apnea     Pt says testing was positive, intolerant of CPAP.  Marland Kitchen Type II diabetes mellitus ([place])   . ESRD (end stage renal disease) ([place])     a. Mass on L kidney per pt s/p nephrectomy - pt states not cancer - [facility] notes indicate ESRD due to HTN/DM - was previously on HD. b. Kidney transplant 02/2011.  . S/P kidney transplant 03/16/2011  . Chronic Persistent Atrial Fibrillation 12/31/2008    Qualifier: Diagnosis of  By: [name]    . Nodule of right lung 04/07/2013    Ground glass opacity right lung  . S/P aortic valve replacement with bioprosthetic valve and maze procedure 04/12/2013    [facility]   . S/P Maze operation for atrial fibrillation 04/12/2013    Complete bilateral atrial lesion set using bipolar radiofrequency and cryothermy ablation with clipping of LA appendage  . Pruritic dermatitis     treated  with steriods/UV light  . Lung nodule seen on imaging study 04/07/2013    1.0 cm ground glass opacity RUL  . Hemorrhoids    Past Surgical History  Procedure Laterality Date  . Cholecystectomy  2009    with hernia removal  . Arteriovenous graft placement Left   . Av fistula placement Right   . Nasal reconstruction with septal repair      "took it out" (04/05/2013)  . Tubal ligation    . Nephrectomy Left 2010    no CA on bx  . Kidney transplant  03/16/11  . Cardioversion  05/29/2012    Procedure: CARDIOVERSION;  Surgeon: Lelon Perla, MD;  Location: [facility] ENDOSCOPY;  Service: Cardiovascular;  Laterality: N/A;  .  Coronary angioplasty with stent placement Right     coronary artery  . Tonsillectomy    . Total abdominal hysterectomy    . Arteriovenous graft placement Left     "I've had 2 on my left; had one removed" (04/05/2013)   . Av fistula repair Right     "took it out" ((/18/2014)  . Insertion of dialysis catheter Bilateral     "over the years; took them both out" (04/05/2013)  . Intraoperative transesophageal echocardiogram N/A 04/11/2013    Procedure: INTRAOPERATIVE TRANSESOPHAGEAL ECHOCARDIOGRAM;  Surgeon: Rexene Alberts, MD;  Location: [place];  Service: Open Heart Surgery;  Laterality: N/A;  . Artery exploration Right 04/11/2013    Procedure: ARTERY EXPLORATION;  Surgeon: Rexene Alberts, MD;  Location: [place];  Service: Open Heart Surgery;  Laterality: Right;  Right carotid artery exploration  . Aortic valve replacement N/A 04/12/2013    Procedure: AORTIC VALVE REPLACEMENT (AVR);  Surgeon: Rexene Alberts, MD;  Location: [place];  Service: Open Heart Surgery;  Laterality: N/A;  . Maze N/A 04/12/2013    Procedure: MAZE;  Surgeon: Rexene Alberts, MD;  Location: [place];  Service: Open Heart Surgery;  Laterality: N/A;  . Intraoperative transesophageal echocardiogram N/A 04/12/2013    Procedure: INTRAOPERATIVE TRANSESOPHAGEAL ECHOCARDIOGRAM;  Surgeon: Rexene Alberts, MD;  Location: [place];  Service: Open Heart Surgery;  Laterality: N/A;  . Left and right heart catheterization with coronary angiogram N/A 04/06/2013    Procedure: LEFT AND RIGHT HEART CATHETERIZATION WITH CORONARY ANGIOGRAM;  Surgeon: Blane Ohara, MD;  Location: [facility] CATH LAB;  Service: Cardiovascular;  Laterality: N/A;    Current Outpatient Prescriptions  Medication Sig Dispense Refill  . Alum & Mag Hydroxide-Simeth (MAGIC MOUTHWASH) SOLN Take 5 mLs by mouth 4 (four) times daily. Swish, hold and swallow 500 mL 1  . amLODipine (NORVASC) 5 MG tablet Take 0.5 tablets (2.5 mg total) by mouth daily. 30 tablet 6  . amoxicillin (AMOXIL) 500 MG  tablet Take all four tablets one hour prior to procedure prn 4 tablet 6  . dapsone 25 MG tablet Take 25 mg by mouth daily.     . diclofenac sodium (VOLTAREN) 1 % GEL Apply 2 g topically 4 (four) times daily. 300 g 3  . diphenhydrAMINE (BENADRYL) 50 MG capsule Take 1 capsule (50 mg total) by mouth every 6 (six) hours as needed for itching. 90 capsule 3  . furosemide (LASIX) 80 MG tablet TAKE 1 TABLET BY MOUTH TWICE A DAY (Patient taking differently: TAKE 1/2 TABLET BY MOUTH TWICE A DAY) 60 tablet 1  . gabapentin (NEURONTIN) 100 MG capsule Take 1 capsule (100 mg total) by mouth 3 (three) times daily. 90 capsule 5  . glipiZIDE (GLUCOTROL XL) 10  MG 24 hr tablet TAKE 1 TABLET BY MOUTH TWICE A DAY 60 tablet 5  . insulin lispro (HUMALOG) 100 UNIT/ML KiwkPen Inject 0.08 mLs (8 Units total) into the skin 4 (four) times daily -  before meals and at bedtime. 15 mL 11  . Insulin Pen Needle 31G X 5 MM MISC Test up to TID dx:250.00 100 each 11  . levothyroxine (LEVOTHROID) 25 MCG tablet Take 2 tablets (50 mcg total) by mouth daily. 60 tablet 11  . metoprolol tartrate (LOPRESSOR) 25 MG tablet TAKE 1 TABLET BY MOUTH TWICE A DAY 60 tablet 7  . mycophenolate (MYFORTIC) 180 MG EC tablet Take 360 mg by mouth 2 (two) times daily.     [name] VERIO test strip USE AS INSTRUCTED 4 TIMES A DAY (BEFORE MEALS) & AT BEDTIME (ICD 250.4) 125 each 6  . oxyCODONE (OXY IR/ROXICODONE) 5 MG immediate release tablet Take 1-2 tablets (5-10 mg total) by mouth every 6 (six) hours as needed. 120 tablet 0  . pravastatin (PRAVACHOL) 40 MG tablet TAKE 1 TABLET BY MOUTH EVERY DAY AS DIRECTED (Patient taking differently: take 1 tablet on Mon/Wed/ Fri's) 30 tablet 5  . tacrolimus (PROGRAF) 1 MG capsule Take 4 mg by mouth 2 (two) times daily.     Marland Kitchen triamcinolone cream (KENALOG) 0.1 % Apply 1 application topically 2 (two) times daily. 60 g 3  . warfarin (COUMADIN) 5 MG tablet TAKE AS DIRECTED BY COUMADIN CLINIC 30 tablet 6   No current  facility-administered medications for this encounter.    Allergies  Allergen Reactions  . Ibuprofen Nausea And Vomiting  . Sulfamethoxazole-Trimethoprim Swelling  . Sulfonamide Derivatives Swelling  . Tape Rash  . Tramadol Nausea And Vomiting  . Bactrim Swelling  . Red Dye Itching and Rash    Social History   Social History  . Marital Status: Married    Spouse Name: N/A  . Number of Children: 5  . Years of Education: N/A   Occupational History  . retired    Social History Main Topics  . Smoking status: Former Smoker -- 1.00 packs/day for 30 years    Types: Cigarettes    Quit date: 07/19/2001  . Smokeless tobacco: Never Used  . Alcohol Use: No  . Drug Use: No  . Sexual Activity: Not Currently   Other Topics Concern  . Not on file   Social History Narrative   Patient signed a Designated Party Release to allow her spouse [name] and family and five children to have access to her medical records/information.      Family History  Problem Relation Age of Onset  . Stroke Father   . Diabetes Other   . Hypertension Mother     ROS- All systems are reviewed and negative except as per the HPI above  Physical Exam: Filed Vitals:   04/29/15 1512  BP: 148/74  Pulse: 62  Height: '5\' 2"'$  (1.575 m)  Weight: 187 lb (84.823 kg)    GEN- The patient is well appearing, alert and oriented x 3 today.   Head- normocephalic, atraumatic Eyes-  Sclera clear, conjunctiva pink Ears- hearing intact Oropharynx- clear Neck- supple, no JVP Lymph- no cervical lymphadenopathy Lungs- Clear to ausculation bilaterally, normal work of breathing Heart- Regular rate and rhythm, no murmurs, rubs or gallops, PMI not laterally displaced GI- soft, NT, ND, + BS Extremities- no clubbing, cyanosis, or edema MS- no significant deformity or atrophy Skin- no rash or lesion Psych- euthymic mood, full affect Neuro-  strength and sensation are intact  EKG-SR with PAC's.pr int 152 ms, qrs int98 ms,  qtc436 ms  Assessment and Plan: 1. Afib s/p maze Not aware of any reoccurrence of afib Continues on warfain/metoprolol  2. S/P aortic valve repalcement with bioprosthetic valve Stable   3. HTN  Acceptable, not optimal Avoid salt  F/u in afib clinc in one year for long term monitoring of afib with maze procedure   [name] C. Deniro Laymon, [facility] [address] [place], [place] [address] [phone]

## 2015-05-13 ENCOUNTER — Ambulatory Visit: Payer: Medicare Other | Admitting: Internal Medicine

## 2015-05-13 ENCOUNTER — Ambulatory Visit (INDEPENDENT_AMBULATORY_CARE_PROVIDER_SITE_OTHER): Payer: Medicare Other

## 2015-05-13 ENCOUNTER — Other Ambulatory Visit: Payer: Self-pay | Admitting: Internal Medicine

## 2015-05-13 DIAGNOSIS — Z5181 Encounter for therapeutic drug level monitoring: Secondary | ICD-10-CM | POA: Diagnosis not present

## 2015-05-13 DIAGNOSIS — I4891 Unspecified atrial fibrillation: Secondary | ICD-10-CM

## 2015-05-13 DIAGNOSIS — Z7901 Long term (current) use of anticoagulants: Secondary | ICD-10-CM | POA: Diagnosis not present

## 2015-05-13 LAB — POCT INR: INR: 2

## 2015-06-04 ENCOUNTER — Other Ambulatory Visit: Payer: Self-pay

## 2015-06-04 ENCOUNTER — Other Ambulatory Visit (HOSPITAL_COMMUNITY): Payer: Self-pay | Admitting: *Deleted

## 2015-06-04 MED ORDER — METOPROLOL TARTRATE 25 MG PO TABS: ORAL_TABLET | ORAL | Status: DC

## 2015-06-06 ENCOUNTER — Ambulatory Visit (INDEPENDENT_AMBULATORY_CARE_PROVIDER_SITE_OTHER): Payer: Medicare Other | Admitting: Internal Medicine

## 2015-06-06 ENCOUNTER — Encounter: Payer: Self-pay | Admitting: Internal Medicine

## 2015-06-06 VITALS — BP 118/72 | HR 71 | Wt 190.0 lb

## 2015-06-06 DIAGNOSIS — E1121 Type 2 diabetes mellitus with diabetic nephropathy: Secondary | ICD-10-CM | POA: Diagnosis not present

## 2015-06-06 DIAGNOSIS — I2510 Atherosclerotic heart disease of native coronary artery without angina pectoris: Secondary | ICD-10-CM

## 2015-06-06 DIAGNOSIS — M5440 Lumbago with sciatica, unspecified side: Secondary | ICD-10-CM

## 2015-06-06 DIAGNOSIS — R635 Abnormal weight gain: Secondary | ICD-10-CM

## 2015-06-06 DIAGNOSIS — Z794 Long term (current) use of insulin: Secondary | ICD-10-CM

## 2015-06-06 MED ORDER — INSULIN LISPRO 100 UNIT/ML (KWIKPEN): PEN_INJECTOR | SUBCUTANEOUS | Status: DC

## 2015-06-06 MED ORDER — OXYCODONE HCL 5 MG PO TABS: 5.0000 mg | ORAL_TABLET | Freq: Four times a day (QID) | ORAL | Status: DC | PRN

## 2015-06-06 MED ORDER — AMOXICILLIN 500 MG PO TABS: ORAL_TABLET | ORAL | Status: DC

## 2015-06-06 NOTE — Assessment & Plan Note (Addendum)
Chronic  Glipizide 1 a day x 1 wk, then stop, Humalog SS - see Rx

## 2015-06-06 NOTE — Progress Notes (Signed)
Subjective:  [name], female    DOB: Mar 04, 1946  Age: 69 y.o. MRN: [id number]  CC: No chief complaint on file.   HPI [name] presents for HTN, ESRD, DM, CAD, dyslipidemia f/u. Pt is raising a 28 mo old GS. CBGs>200. Labs w/Cardiology and Nephrology  Outpatient Prescriptions Prior to Visit  Medication Sig Dispense Refill  . Alum & Mag Hydroxide-Simeth (MAGIC MOUTHWASH) SOLN Take 5 mLs by mouth 4 (four) times daily. Swish, hold and swallow 500 mL 1  . amLODipine (NORVASC) 5 MG tablet Take 0.5 tablets (2.5 mg total) by mouth daily. 30 tablet 6  . dapsone 25 MG tablet Take 25 mg by mouth daily.     . diphenhydrAMINE (BENADRYL) 50 MG capsule Take 1 capsule (50 mg total) by mouth every 6 (six) hours as needed for itching. 90 capsule 3  . furosemide (LASIX) 80 MG tablet TAKE 1 TABLET BY MOUTH TWICE A DAY (Patient taking differently: TAKE 1/2 TABLET BY MOUTH TWICE A DAY) 60 tablet 1  . gabapentin (NEURONTIN) 100 MG capsule Take 1 capsule (100 mg total) by mouth 3 (three) times daily. 90 capsule 5  . Insulin Pen Needle 31G X 5 MM MISC Test up to TID dx:250.00 100 each 11  . levothyroxine (LEVOTHROID) 25 MCG tablet Take 2 tablets (50 mcg total) by mouth daily. 60 tablet 11  . metoprolol tartrate (LOPRESSOR) 25 MG tablet TAKE 1 TABLET BY MOUTH TWICE A DAY 60 tablet 10  . mycophenolate (MYFORTIC) 180 MG EC tablet Take 360 mg by mouth 2 (two) times daily.     [name] VERIO test strip USE AS INSTRUCTED 4 TIMES A DAY (BEFORE MEALS) & AT BEDTIME (ICD 250.4) 125 each 6  . tacrolimus (PROGRAF) 1 MG capsule Take 4 mg by mouth 2 (two) times daily.     Marland Kitchen warfarin (COUMADIN) 5 MG tablet TAKE AS DIRECTED BY COUMADIN CLINIC 30 tablet 6  . amoxicillin (AMOXIL) 500 MG tablet Take all four tablets one hour prior to procedure prn 4 tablet 6  . glipiZIDE (GLUCOTROL XL) 10 MG 24 hr tablet TAKE 1 TABLET BY MOUTH TWICE A DAY 60 tablet 5  . insulin lispro (HUMALOG) 100 UNIT/ML KiwkPen Inject  0.08 mLs (8 Units total) into the skin 4 (four) times daily -  before meals and at bedtime. 15 mL 11  . oxyCODONE (OXY IR/ROXICODONE) 5 MG immediate release tablet Take 1-2 tablets (5-10 mg total) by mouth every 6 (six) hours as needed. 120 tablet 0  . diclofenac sodium (VOLTAREN) 1 % GEL Apply 2 g topically 4 (four) times daily. (Patient not taking: Reported on 06/06/2015) 300 g 3  . pravastatin (PRAVACHOL) 40 MG tablet TAKE 1 TABLET BY MOUTH EVERY DAY AS DIRECTED (Patient not taking: Reported on 06/06/2015) 30 tablet 5  . triamcinolone cream (KENALOG) 0.1 % Apply 1 application topically 2 (two) times daily. (Patient not taking: Reported on 06/06/2015) 60 g 3   No facility-administered medications prior to visit.    ROS Review of Systems  Constitutional: Negative for chills, activity change, appetite change, fatigue and unexpected weight change.  HENT: Negative for congestion, mouth sores and sinus pressure.   Eyes: Negative for visual disturbance.  Respiratory: Negative for cough and chest tightness.   Cardiovascular: Negative for chest pain.  Gastrointestinal: Negative for nausea and abdominal pain.  Genitourinary: Negative for frequency, difficulty urinating and vaginal pain.  Musculoskeletal: Positive for arthralgias. Negative for back pain and gait problem.  Skin: Negative for pallor and rash.  Neurological: Negative for dizziness, tremors, weakness, numbness and headaches.  Psychiatric/Behavioral: Negative for suicidal ideas, confusion and sleep disturbance. The patient is not nervous/anxious.     Objective:  BP 118/72 mmHg  Pulse 71  Wt 190 lb (86.183 kg)  SpO2 97%  BP Readings from Last 3 Encounters:  06/06/15 118/72  04/29/15 148/74  02/10/15 160/90    Wt Readings from Last 3 Encounters:  06/06/15 190 lb (86.183 kg)  04/29/15 187 lb (84.823 kg)  02/10/15 192 lb (87.091 kg)    Physical Exam  Constitutional: She appears well-developed. No distress.  HENT:  Head:  Normocephalic.  Right Ear: External ear normal.  Left Ear: External ear normal.  Nose: Nose normal.  Mouth/Throat: Oropharynx is clear and moist.  Eyes: Conjunctivae are normal. Pupils are equal, round, and reactive to light. Right eye exhibits no discharge. Left eye exhibits no discharge.  Neck: Normal range of motion. Neck supple. No JVD present. No tracheal deviation present. No thyromegaly present.  Cardiovascular: Normal rate, regular rhythm and normal heart sounds.   Pulmonary/Chest: No stridor. No respiratory distress. She has no wheezes.  Abdominal: Soft. Bowel sounds are normal. She exhibits no distension and no mass. There is no tenderness. There is no rebound and no guarding.  Musculoskeletal: She exhibits no edema or tenderness.  Lymphadenopathy:    She has no cervical adenopathy.  Neurological: She displays normal reflexes. No cranial nerve deficit. She exhibits normal muscle tone. Coordination normal.  Skin: No rash noted. No erythema.  Psychiatric: She has a normal mood and affect. Her behavior is normal. Judgment and thought content normal.  [place]  Lab Results  Component Value Date   WBC 6.4 01/09/2014   HGB 11.8* 01/09/2014   HCT 37.1 01/09/2014   PLT 213.0 01/09/2014   GLUCOSE 200* 04/12/2014   CHOL 164 04/06/2013   TRIG 125 04/06/2013   HDL 73 04/06/2013   LDLDIRECT 89.3 06/13/2006   LDLCALC 66 04/06/2013   ALT 24 01/09/2014   AST 23 01/09/2014   NA 138 04/12/2014   K 3.7 04/12/2014   CL 103 04/12/2014   CREATININE 1.6* 04/12/2014   BUN 27* 04/12/2014   CO2 30 04/12/2014   TSH 5.47* 01/09/2014   INR 2.0 05/13/2015   HGBA1C 7.9* 04/12/2014   MICROALBUR 168.6* 10/31/2006    No results found.  Assessment & Plan:   Diagnoses and all orders for this visit:  Type 2 diabetes mellitus with diabetic nephropathy, with long-term current use of insulin (HCC)  Midline low back pain with sciatica, sciatica laterality unspecified  Weight gain  Other  orders -     insulin lispro (HUMALOG) 100 UNIT/ML KiwkPen; If sugar is 140-200 - use 8 units If sugar is  201-230- use 10 units If sugar is  231-260 - use 12 units If sugar is  261- 300- use 14 units If sugar is >301 - use 16 units -     amoxicillin (AMOXIL) 500 MG tablet; Take all four tablets one hour prior to procedure prn -     oxyCODONE (OXY IR/ROXICODONE) 5 MG immediate release tablet; Take 1-2 tablets (5-10 mg total) by mouth every 6 (six) hours as needed.  I have discontinued Ms. Hildebrant's glipiZIDE. I have also changed her insulin lispro. Additionally, I am having her maintain her mycophenolate, tacrolimus, dapsone, diphenhydrAMINE, Insulin Pen Needle, magic mouthwash, ONETOUCH VERIO, amLODipine, furosemide, triamcinolone cream, pravastatin, diclofenac sodium, warfarin, gabapentin, levothyroxine, metoprolol tartrate, amoxicillin, and  oxyCODONE.  Meds ordered this encounter  Medications  . insulin lispro (HUMALOG) 100 UNIT/ML KiwkPen    Sig: If sugar is 140-200 - use 8 units If sugar is  201-230- use 10 units If sugar is  231-260 - use 12 units If sugar is  261- 300- use 14 units If sugar is >301 - use 16 units    Dispense:  15 mL    Refill:  11  . amoxicillin (AMOXIL) 500 MG tablet    Sig: Take all four tablets one hour prior to procedure prn    Dispense:  4 tablet    Refill:  6  . oxyCODONE (OXY IR/ROXICODONE) 5 MG immediate release tablet    Sig: Take 1-2 tablets (5-10 mg total) by mouth every 6 (six) hours as needed.    Dispense:  120 tablet    Refill:  0     Follow-up: Return in about 4 months (around 10/04/2015) for a follow-up visit.  Walker Kehr, MD

## 2015-06-06 NOTE — Assessment & Plan Note (Signed)
Chronic 2012 Oxycodone prn  Potential benefits of a long term oxycodone use as well as potential risks (i.e. addiction risk (low), apnea etc) and complications (i.e. Somnolence, seizures, constipation and others) were explained to the patient and were aknowledged.

## 2015-06-06 NOTE — Assessment & Plan Note (Signed)
Better  

## 2015-06-06 NOTE — Progress Notes (Signed)
Pre visit review using our clinic review tool, if applicable. No additional management support is needed unless otherwise documented below in the visit note. 

## 2015-06-10 ENCOUNTER — Encounter: Payer: Self-pay | Admitting: Internal Medicine

## 2015-06-10 ENCOUNTER — Other Ambulatory Visit: Payer: Self-pay | Admitting: Cardiology

## 2015-06-10 ENCOUNTER — Ambulatory Visit (INDEPENDENT_AMBULATORY_CARE_PROVIDER_SITE_OTHER): Payer: Medicare Other | Admitting: *Deleted

## 2015-06-10 DIAGNOSIS — I4891 Unspecified atrial fibrillation: Secondary | ICD-10-CM

## 2015-06-10 DIAGNOSIS — Z5181 Encounter for therapeutic drug level monitoring: Secondary | ICD-10-CM

## 2015-06-10 DIAGNOSIS — Z7901 Long term (current) use of anticoagulants: Secondary | ICD-10-CM

## 2015-06-10 LAB — POCT INR: INR: 1.8

## 2015-06-17 DIAGNOSIS — N189 Chronic kidney disease, unspecified: Secondary | ICD-10-CM | POA: Diagnosis not present

## 2015-06-17 DIAGNOSIS — Z940 Kidney transplant status: Secondary | ICD-10-CM | POA: Diagnosis not present

## 2015-06-17 DIAGNOSIS — Z79899 Other long term (current) drug therapy: Secondary | ICD-10-CM | POA: Diagnosis not present

## 2015-06-17 DIAGNOSIS — N2581 Secondary hyperparathyroidism of renal origin: Secondary | ICD-10-CM | POA: Diagnosis not present

## 2015-06-19 DIAGNOSIS — D899 Disorder involving the immune mechanism, unspecified: Secondary | ICD-10-CM | POA: Diagnosis not present

## 2015-06-19 DIAGNOSIS — I10 Essential (primary) hypertension: Secondary | ICD-10-CM | POA: Diagnosis not present

## 2015-06-19 DIAGNOSIS — Z940 Kidney transplant status: Secondary | ICD-10-CM | POA: Diagnosis not present

## 2015-06-19 DIAGNOSIS — R809 Proteinuria, unspecified: Secondary | ICD-10-CM | POA: Diagnosis not present

## 2015-06-25 ENCOUNTER — Other Ambulatory Visit: Payer: Self-pay | Admitting: Internal Medicine

## 2015-06-25 ENCOUNTER — Telehealth: Payer: Self-pay | Admitting: Cardiology

## 2015-06-25 NOTE — Telephone Encounter (Signed)
New message     1. What dental office are you calling from? Dr Jeronimo Greaves  2. What is your office phone and fax number? Fax [phone]  3. What type of procedure is the patient having performed?  extraction  What date is procedure scheduled? Not scheduled 4. What is your question (ex. Antibiotics prior to procedure, holding medication-we need to know how long dentist wants pt to hold med)?  Pt is on coumadin--how many days to hold medication prior to extraction?

## 2015-06-25 NOTE — Telephone Encounter (Signed)
Will fax this note to the number provided. 

## 2015-06-25 NOTE — Telephone Encounter (Signed)
Needs SBE prophylaxis; in sinus; ok to DC coumadin 4 days prior to procedure and resume day of . [name]

## 2015-07-01 ENCOUNTER — Ambulatory Visit (INDEPENDENT_AMBULATORY_CARE_PROVIDER_SITE_OTHER): Payer: Medicare Other | Admitting: *Deleted

## 2015-07-01 DIAGNOSIS — Z5181 Encounter for therapeutic drug level monitoring: Secondary | ICD-10-CM

## 2015-07-01 DIAGNOSIS — Z7901 Long term (current) use of anticoagulants: Secondary | ICD-10-CM

## 2015-07-01 DIAGNOSIS — I4891 Unspecified atrial fibrillation: Secondary | ICD-10-CM

## 2015-07-01 LAB — POCT INR: INR: 2.2

## 2015-07-01 NOTE — Patient Instructions (Addendum)
Take your last dose of Coumadin on 07/11/15-approved to hold for 4 days prior to Extraction per Dr. [name]  Resume taking your Coumadin on 07/16/15-the day of procedure as instructed by Dr. Jacalyn Lefevre note  Once you restart take an extra 1/2 tablet for 2 days then resume normal dose of Coumadin

## 2015-07-21 ENCOUNTER — Other Ambulatory Visit: Payer: Self-pay | Admitting: Internal Medicine

## 2015-07-24 ENCOUNTER — Ambulatory Visit (INDEPENDENT_AMBULATORY_CARE_PROVIDER_SITE_OTHER): Payer: Medicare Other | Admitting: *Deleted

## 2015-07-24 DIAGNOSIS — Z7901 Long term (current) use of anticoagulants: Secondary | ICD-10-CM | POA: Diagnosis not present

## 2015-07-24 DIAGNOSIS — Z5181 Encounter for therapeutic drug level monitoring: Secondary | ICD-10-CM

## 2015-07-24 DIAGNOSIS — I4891 Unspecified atrial fibrillation: Secondary | ICD-10-CM

## 2015-07-24 LAB — POCT INR: INR: 1.4

## 2015-08-07 ENCOUNTER — Encounter (HOSPITAL_COMMUNITY): Payer: Self-pay | Admitting: *Deleted

## 2015-08-07 ENCOUNTER — Other Ambulatory Visit (HOSPITAL_COMMUNITY)
Admission: RE | Admit: 2015-08-07 | Discharge: 2015-08-07 | Disposition: A | Discharge status: Home / Self Care | Payer: Medicare Other | Source: Ambulatory Visit | Attending: Family Medicine | Admitting: Family Medicine

## 2015-08-07 ENCOUNTER — Emergency Department (INDEPENDENT_AMBULATORY_CARE_PROVIDER_SITE_OTHER)
Admission: EM | Admit: 2015-08-07 | Discharge: 2015-08-07 | Disposition: A | Discharge status: Home / Self Care | Payer: Medicare Other | Source: Home / Self Care

## 2015-08-07 ENCOUNTER — Ambulatory Visit (INDEPENDENT_AMBULATORY_CARE_PROVIDER_SITE_OTHER): Payer: Medicare Other | Admitting: *Deleted

## 2015-08-07 ENCOUNTER — Other Ambulatory Visit (HOSPITAL_COMMUNITY): Admission: AD | Admit: 2015-08-07 | Payer: Self-pay | Source: Ambulatory Visit | Admitting: Family Medicine

## 2015-08-07 DIAGNOSIS — Z7901 Long term (current) use of anticoagulants: Secondary | ICD-10-CM | POA: Diagnosis not present

## 2015-08-07 DIAGNOSIS — L0211 Cutaneous abscess of neck: Secondary | ICD-10-CM | POA: Diagnosis not present

## 2015-08-07 DIAGNOSIS — Z5181 Encounter for therapeutic drug level monitoring: Secondary | ICD-10-CM

## 2015-08-07 DIAGNOSIS — I4891 Unspecified atrial fibrillation: Secondary | ICD-10-CM

## 2015-08-07 LAB — POCT INR: INR: 3

## 2015-08-07 MED ORDER — DOXYCYCLINE HYCLATE 100 MG PO CAPS: 100.0000 mg | ORAL_CAPSULE | Freq: Two times a day (BID) | ORAL | Status: DC

## 2015-08-07 NOTE — ED Provider Notes (Signed)
CSN: [id number]     Arrival date & time 08/07/15  1531 History   None    Chief Complaint  Patient presents with  . Abscess   (Consider location/radiation/quality/duration/timing/severity/associated sxs/prior Treatment) Patient is a 70 y.o. female presenting with abscess. The history is provided by the patient.  Abscess Location:  Head/neck Head/neck abscess location:  R neck Abscess quality: fluctuance, induration and painful   Duration:  3 weeks Progression:  Worsening Pain details:    Quality:  Sharp and pressure   Severity:  Moderate   Progression:  Worsening Chronicity:  New Ineffective treatments:  Warm compresses Associated symptoms: no fever   Risk factors: no prior abscess     Past Medical History  Diagnosis Date  . Coronary artery disease 05/2002    a. Ant MI 2003 s/p PTCA/stent to RCA.   Marland Kitchen Hypertension   . Hyperlipidemia   . CHF (congestive heart failure) ([place])   . Pericardial effusion     a. Small by echo 11/2011.  Marland Kitchen GERD (gastroesophageal reflux disease)   . 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 Unspecified hearing loss   . Anemia, iron deficiency     of chronic disease  . Helicobacter pylori (H. pylori) infection     hx of  . Esophagitis, reflux   . Gout   . Cholelithiasis   . Hx of colonic polyps     adenomatous  . Diverticulosis of colon   . 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  Marland Kitchen Chronic cough onset 03/2010    Dr Melvyn Novas  . Carotid artery disease ([place])     a. Carotid Dopplers performed in August 2013 showed 40-59% left stenosis and 0-39% right; f/u recommended in 2 years.   . Aortic stenosis     a. Severe AS by echo 11/2012.  . Asystole ([place])     a. During ENT surgery 2005: developed marked asystole requiring CPR, felt due to vagal reaction (cath nonobst dz).  .  Myocardial infarction ([place]) 2003  . Cerebrovascular accident [facility]) 2009    a. LMCA infarct felt embolic 2671, maintained on chronic coumadin.; denies residual on 04/05/2013  . Sleep apnea     Pt says testing was positive, intolerant of CPAP.  Marland Kitchen Type II diabetes mellitus ([place])   . ESRD (end stage renal disease) ([place])     a. Mass on L kidney per pt s/p nephrectomy - pt states not cancer - [facility] notes indicate ESRD due to HTN/DM - was previously on HD. b. Kidney transplant 02/2011.  . S/P kidney transplant 03/16/2011  . Chronic Persistent Atrial Fibrillation 12/31/2008    Qualifier: Diagnosis of  By: [name]    . Nodule of right lung 04/07/2013    Ground glass opacity right lung  . S/P aortic valve replacement with bioprosthetic valve and maze procedure 04/12/2013    [facility]   . S/P Maze operation for atrial fibrillation 04/12/2013    Complete bilateral atrial lesion set using bipolar radiofrequency and cryothermy ablation with clipping of LA appendage  . Pruritic dermatitis     treated with steriods/UV light  . Lung nodule seen on imaging study 04/07/2013    1.0 cm ground glass opacity RUL  . Hemorrhoids    Past Surgical History  Procedure Laterality Date  .  Cholecystectomy  2009    with hernia removal  . Arteriovenous graft placement Left   . Av fistula placement Right   . Nasal reconstruction with septal repair      "took it out" (04/05/2013)  . Tubal ligation    . Nephrectomy Left 2010    no CA on bx  . Kidney transplant  03/16/11  . Cardioversion  05/29/2012    Procedure: CARDIOVERSION;  Surgeon: Lelon Perla, MD;  Location[facility] ENDOSCOPY;  Service: Cardiovascular;  Laterality: N/A;  . Coronary angioplasty with stent placement Right     coronary artery  . Tonsillectomy    . Total abdominal hysterectomy    . Arteriovenous graft placement Left     "I've had 2 on my left; had one removed" (04/05/2013)   . Av fistula repair Right      "took it out" ((/18/2014)  . Insertion of dialysis catheter Bilateral     "over the years; took them both out" (04/05/2013)  . Intraoperative transesophageal echocardiogram N/A 04/11/2013    Procedure: INTRAOPERATIVE TRANSESOPHAGEAL ECHOCARDIOGRAM;  Surgeon: Rexene Alberts, MD;  Location: [place];  Service: Open Heart Surgery;  Laterality: N/A;  . Artery exploration Right 04/11/2013    Procedure: ARTERY EXPLORATION;  Surgeon: Rexene Alberts, MD;  Location: [place];  Service: Open Heart Surgery;  Laterality: Right;  Right carotid artery exploration  . Aortic valve replacement N/A 04/12/2013    Procedure: AORTIC VALVE REPLACEMENT (AVR);  Surgeon: Rexene Alberts, MD;  [place]  Service: Open Heart Surgery;  Laterality: N/A;  . Maze N/A 04/12/2013    Procedure: MAZE;  Surgeon: Rexene Alberts, MD;  Location: [place];  Service: Open Heart Surgery;  Laterality: N/A;  . Intraoperative transesophageal echocardiogram N/A 04/12/2013    Procedure: INTRAOPERATIVE TRANSESOPHAGEAL ECHOCARDIOGRAM;  Surgeon: Rexene Alberts, MD;  Location: [place];  Service: Open Heart Surgery;  Laterality: N/A;  . Left and right heart catheterization with coronary angiogram N/A 04/06/2013    Procedure: LEFT AND RIGHT HEART CATHETERIZATION WITH CORONARY ANGIOGRAM;  Surgeon: Blane Ohara, MD;  Location: [facility] CATH LAB;  Service: Cardiovascular;  Laterality: N/A;   Family History  Problem Relation Age of Onset  . Stroke Father   . Diabetes Other   . Hypertension Mother    Social History  Substance Use Topics  . Smoking status: Former Smoker -- 1.00 packs/day for 30 years    Types: Cigarettes    Quit date: 07/19/2001  . Smokeless tobacco: Never Used  . Alcohol Use: No   OB History    No data available     Review of Systems  Constitutional: Negative.  Negative for fever.  Musculoskeletal: Positive for neck pain.  Skin: Positive for rash.  All other systems reviewed and are negative.   Allergies  Ibuprofen;  Sulfamethoxazole-trimethoprim; Sulfonamide derivatives; Tape; Tramadol; Bactrim; and Red dye  Home Medications   Prior to Admission medications   Medication Sig Start Date End Date Taking? Authorizing Provider  Alum & Mag Hydroxide-Simeth (MAGIC MOUTHWASH) SOLN Take 5 mLs by mouth 4 (four) times daily. Swish, hold and swallow 10/09/13   Aleksei Plotnikov V, MD  amLODipine (NORVASC) 5 MG tablet Take 0.5 tablets (2.5 mg total) by mouth daily. 06/24/14   Lelon Perla, MD  amoxicillin (AMOXIL) 500 MG tablet Take all four tablets one hour prior to procedure prn 06/06/15   [name], MD  dapsone 25 MG tablet Take 25 mg by mouth daily.  Historical Provider, MD  diclofenac sodium (VOLTAREN) 1 % GEL Apply 2 g topically 4 (four) times daily. Patient not taking: Reported on 06/06/2015 11/11/14   [name], MD  diphenhydrAMINE (BENADRYL) 50 MG capsule Take 1 capsule (50 mg total) by mouth every 6 (six) hours as needed for itching. 08/31/11   Aleksei Plotnikov V, MD  doxycycline (VIBRAMYCIN) 100 MG capsule Take 1 capsule (100 mg total) by mouth 2 (two) times daily. 08/07/15   Billy Fischer, MD  furosemide (LASIX) 80 MG tablet TAKE 1 TABLET BY MOUTH TWICE A DAY Patient taking differently: TAKE 1/2 TABLET BY MOUTH TWICE A DAY 07/31/14   Aleksei Plotnikov V, MD  furosemide (LASIX) 80 MG tablet TAKE 1 TABLET BY MOUTH TWICE A DAY 06/25/15   Aleksei Plotnikov V, MD  gabapentin (NEURONTIN) 100 MG capsule Take 1 capsule (100 mg total) by mouth 3 (three) times daily. 01/10/15   Aleksei Plotnikov V, MD  insulin lispro (HUMALOG) 100 UNIT/ML KiwkPen If sugar is 140-200 - use 8 units If sugar is  201-230- use 10 units If sugar is  231-260 - use 12 units If sugar is  261- 300- use 14 units If sugar is >301 - use 16 units 06/06/15   Aleksei Plotnikov V, MD  Insulin Pen Needle 31G X 5 MM MISC Test up to TID dx:250.00 06/27/13   Aleksei Plotnikov V, MD  levothyroxine (LEVOTHROID) 25 MCG tablet Take 2  tablets (50 mcg total) by mouth daily. 03/25/15   Aleksei Plotnikov V, MD  metoprolol tartrate (LOPRESSOR) 25 MG tablet TAKE 1 TABLET BY MOUTH TWICE A DAY 06/04/15   Lelon Perla, MD  mycophenolate (MYFORTIC) 180 MG EC tablet Take 360 mg by mouth 2 (two) times daily.     Historical Provider, MD  ONETOUCH VERIO test strip USE AS INSTRUCTED 4 TIMES A DAY (BEFORE MEALS) & AT BEDTIME (ICD 10-E11.9) 07/22/15   Aleksei Plotnikov V, MD  oxyCODONE (OXY IR/ROXICODONE) 5 MG immediate release tablet Take 1-2 tablets (5-10 mg total) by mouth every 6 (six) hours as needed. 06/06/15   Aleksei Plotnikov V, MD  pravastatin (PRAVACHOL) 40 MG tablet TAKE 1 TABLET BY MOUTH EVERY DAY AS DIRECTED Patient not taking: Reported on 06/06/2015 08/14/14   Lew Dawes V, MD  tacrolimus (PROGRAF) 1 MG capsule Take 4 mg by mouth 2 (two) times daily.     Historical Provider, MD  triamcinolone cream (KENALOG) 0.1 % Apply 1 application topically 2 (two) times daily. Patient not taking: Reported on 06/06/2015 08/12/14   [name], MD  warfarin (COUMADIN) 5 MG tablet Take as directed by coumadin clinic Dispense 40 tablets 1 month supply disregard 30 tablets 06/10/15   Lelon Perla, MD   Meds Ordered and Administered this Visit  Medications - No data to display  BP 171/75 mmHg  Pulse 65  Temp(Src) 99.2 F (37.3 C) (Oral)  Resp 16  SpO2 99% No data found.   Physical Exam  Constitutional: She is oriented to person, place, and time. She appears well-developed and well-nourished.  Neck: Normal range of motion. Neck supple.  2cm fluctuant abscess under chin of neck.  Lymphadenopathy:    She has no cervical adenopathy.  Neurological: She is alert and oriented to person, place, and time.  Skin: Skin is warm and dry.  Nursing note and vitals reviewed.   ED Course  .Marland KitchenIncision and Drainage Date/Time: 08/07/2015 4:58 PM Performed by: Billy Fischer Authorized by: Ihor Gully D Consent:  Verbal consent  obtained. Consent given by: patient Type: abscess Body area: neck Location details: right anterior neck Local anesthetic: topical anesthetic Patient sedated: no Scalpel size: 11 Incision type: single straight Incision depth: subcutaneous Complexity: simple Drainage: purulent Drainage amount: moderate Wound treatment: wound left open Patient tolerance: Patient tolerated the procedure well with no immediate complications Comments: Culture obtained.   (including critical care time)  Labs Review Labs Reviewed  CULTURE, ROUTINE-ABSCESS    Imaging Review No results found.   Visual Acuity Review  Right Eye Distance:   Left Eye Distance:   Bilateral Distance:    Right Eye Near:   Left Eye Near:    Bilateral Near:         MDM   1. Cutaneous abscess of neck        Billy Fischer, MD 08/08/15 2002

## 2015-08-07 NOTE — Discharge Instructions (Signed)
Warm cloth twice a day with antibiotic, take all of medicine, return as needed.

## 2015-08-07 NOTE — ED Notes (Addendum)
Pt  Has  An  abcess   Under  Her chin     That  Started  About  2.5 weeks  Ago  And  Is  Getting  Worse  The  Swelling     Is  Present     And  It is  Tender  To  The  Touch         her  Airway  Is  Intact  And  She is  Speaking in  Complete  sentances  Pt states   She  Was  At the  Pulmonary    Clinic  Today       And  Was  Told  To  Come  To the ucc      For  Her  abcess

## 2015-08-10 ENCOUNTER — Telehealth (HOSPITAL_COMMUNITY): Payer: Self-pay | Admitting: Internal Medicine

## 2015-08-10 LAB — CULTURE, ROUTINE-ABSCESS

## 2015-08-10 NOTE — ED Notes (Signed)
Abscess material growing MRSA.  Allergic to sulfa.  MRSA is sensitive to tetracycline/doxycycline. Patient received rx for doxycycline at [facility] visit 08/07/15.  LM  Sherlene Shams, MD 08/10/15 [phone]

## 2015-08-13 ENCOUNTER — Encounter: Payer: Self-pay | Admitting: Internal Medicine

## 2015-08-13 ENCOUNTER — Ambulatory Visit (INDEPENDENT_AMBULATORY_CARE_PROVIDER_SITE_OTHER): Payer: Medicare Other | Admitting: Internal Medicine

## 2015-08-13 VITALS — BP 150/84 | HR 73 | Wt 187.0 lb

## 2015-08-13 DIAGNOSIS — Z794 Long term (current) use of insulin: Secondary | ICD-10-CM

## 2015-08-13 DIAGNOSIS — L0291 Cutaneous abscess, unspecified: Secondary | ICD-10-CM | POA: Insufficient documentation

## 2015-08-13 DIAGNOSIS — N189 Chronic kidney disease, unspecified: Secondary | ICD-10-CM | POA: Diagnosis not present

## 2015-08-13 DIAGNOSIS — E1121 Type 2 diabetes mellitus with diabetic nephropathy: Secondary | ICD-10-CM | POA: Diagnosis not present

## 2015-08-13 DIAGNOSIS — I5020 Unspecified systolic (congestive) heart failure: Secondary | ICD-10-CM

## 2015-08-13 DIAGNOSIS — L0201 Cutaneous abscess of face: Secondary | ICD-10-CM

## 2015-08-13 DIAGNOSIS — N2581 Secondary hyperparathyroidism of renal origin: Secondary | ICD-10-CM | POA: Diagnosis not present

## 2015-08-13 DIAGNOSIS — Z79899 Other long term (current) drug therapy: Secondary | ICD-10-CM | POA: Diagnosis not present

## 2015-08-13 DIAGNOSIS — Z940 Kidney transplant status: Secondary | ICD-10-CM | POA: Diagnosis not present

## 2015-08-13 DIAGNOSIS — M25511 Pain in right shoulder: Secondary | ICD-10-CM | POA: Diagnosis not present

## 2015-08-13 NOTE — Assessment & Plan Note (Signed)
1/17 R shoulder contusion, s/p fall

## 2015-08-13 NOTE — Assessment & Plan Note (Signed)
  Chronic Metoprolol, Furosemide

## 2015-08-13 NOTE — Patient Instructions (Signed)
You had a lower chin MRSA abscess  Finish Doxycycline Come back for another incision if worse

## 2015-08-13 NOTE — Progress Notes (Signed)
Pre visit review using our clinic review tool, if applicable. No additional management support is needed unless otherwise documented below in the visit note. 

## 2015-08-13 NOTE — Progress Notes (Signed)
Subjective:  [name], female    DOB: 20-Jun-1946  Age: 70 y.o. MRN: [id number]  CC: No chief complaint on file.   HPI Frank A Lodes presents for a lower chin MRSA abscess - s/p I&D at [facility] on 08/07/15. C/o a fall last week - c/o R shoulder pain. F/u HTN - did not take meds today  Outpatient Prescriptions Prior to Visit  Medication Sig Dispense Refill  . Alum & Mag Hydroxide-Simeth (MAGIC MOUTHWASH) SOLN Take 5 mLs by mouth 4 (four) times daily. Swish, hold and swallow 500 mL 1  . amLODipine (NORVASC) 5 MG tablet Take 0.5 tablets (2.5 mg total) by mouth daily. 30 tablet 6  . amoxicillin (AMOXIL) 500 MG tablet Take all four tablets one hour prior to procedure prn 4 tablet 6  . dapsone 25 MG tablet Take 25 mg by mouth daily.     . diphenhydrAMINE (BENADRYL) 50 MG capsule Take 1 capsule (50 mg total) by mouth every 6 (six) hours as needed for itching. 90 capsule 3  . doxycycline (VIBRAMYCIN) 100 MG capsule Take 1 capsule (100 mg total) by mouth 2 (two) times daily. 20 capsule 0  . furosemide (LASIX) 80 MG tablet TAKE 1 TABLET BY MOUTH TWICE A DAY (Patient taking differently: TAKE 1/2 TABLET BY MOUTH TWICE A DAY) 60 tablet 1  . gabapentin (NEURONTIN) 100 MG capsule Take 1 capsule (100 mg total) by mouth 3 (three) times daily. 90 capsule 5  . insulin lispro (HUMALOG) 100 UNIT/ML KiwkPen If sugar is 140-200 - use 8 units If sugar is  201-230- use 10 units If sugar is  231-260 - use 12 units If sugar is  261- 300- use 14 units If sugar is >301 - use 16 units 15 mL 11  . Insulin Pen Needle 31G X 5 MM MISC Test up to TID dx:250.00 100 each 11  . levothyroxine (LEVOTHROID) 25 MCG tablet Take 2 tablets (50 mcg total) by mouth daily. 60 tablet 11  . metoprolol tartrate (LOPRESSOR) 25 MG tablet TAKE 1 TABLET BY MOUTH TWICE A DAY 60 tablet 10  . mycophenolate (MYFORTIC) 180 MG EC tablet Take 360 mg by mouth 2 (two) times daily.     [name] VERIO test strip USE AS INSTRUCTED 4 TIMES  A DAY (BEFORE MEALS) & AT BEDTIME (ICD 10-E11.9) 150 each 4  . oxyCODONE (OXY IR/ROXICODONE) 5 MG immediate release tablet Take 1-2 tablets (5-10 mg total) by mouth every 6 (six) hours as needed. 120 tablet 0  . tacrolimus (PROGRAF) 1 MG capsule Take 4 mg by mouth 2 (two) times daily.     Marland Kitchen warfarin (COUMADIN) 5 MG tablet Take as directed by coumadin clinic Dispense 40 tablets 1 month supply disregard 30 tablets 40 tablet 3  . diclofenac sodium (VOLTAREN) 1 % GEL Apply 2 g topically 4 (four) times daily. (Patient not taking: Reported on 08/13/2015) 300 g 3  . pravastatin (PRAVACHOL) 40 MG tablet TAKE 1 TABLET BY MOUTH EVERY DAY AS DIRECTED (Patient not taking: Reported on 08/13/2015) 30 tablet 5  . triamcinolone cream (KENALOG) 0.1 % Apply 1 application topically 2 (two) times daily. (Patient not taking: Reported on 08/13/2015) 60 g 3  . furosemide (LASIX) 80 MG tablet TAKE 1 TABLET BY MOUTH TWICE A DAY (Patient not taking: Reported on 08/13/2015) 60 tablet 5   No facility-administered medications prior to visit.    ROS Review of Systems  Constitutional: Negative for chills, activity change, appetite change,  fatigue and unexpected weight change.  HENT: Negative for congestion, mouth sores and sinus pressure.   Eyes: Negative for visual disturbance.  Respiratory: Negative for cough and chest tightness.   Gastrointestinal: Negative for nausea and abdominal pain.  Genitourinary: Negative for frequency, difficulty urinating and vaginal pain.  Musculoskeletal: Positive for arthralgias. Negative for back pain and gait problem.  Skin: Negative for pallor and rash.  Neurological: Positive for light-headedness. Negative for dizziness, tremors, weakness, numbness and headaches.  Psychiatric/Behavioral: Negative for confusion and sleep disturbance. The patient is nervous/anxious.     Objective:  BP 150/84 mmHg  Pulse 73  Wt 187 lb (84.823 kg)  SpO2 97%  BP Readings from Last 3 Encounters:  08/13/15  150/84  08/07/15 171/75  06/06/15 118/72    Wt Readings from Last 3 Encounters:  08/13/15 187 lb (84.823 kg)  06/06/15 190 lb (86.183 kg)  04/29/15 187 lb (84.823 kg)    Physical Exam  Constitutional: She appears well-developed. No distress.  HENT:  Head: Normocephalic.  Right Ear: External ear normal.  Left Ear: External ear normal.  Nose: Nose normal.  Mouth/Throat: Oropharynx is clear and moist.  Eyes: Conjunctivae are normal. Pupils are equal, round, and reactive to light. Right eye exhibits no discharge. Left eye exhibits no discharge.  Neck: Normal range of motion. Neck supple. No JVD present. No tracheal deviation present. No thyromegaly present.  Cardiovascular: Normal rate, regular rhythm and normal heart sounds.   Pulmonary/Chest: No stridor. No respiratory distress. She has no wheezes.  Abdominal: Soft. Bowel sounds are normal. She exhibits no distension and no mass. There is no tenderness. There is no rebound and no guarding.  Musculoskeletal: She exhibits tenderness. She exhibits no edema.  Lymphadenopathy:    She has no cervical adenopathy.  Neurological: She displays normal reflexes. No cranial nerve deficit. She exhibits normal muscle tone. Coordination abnormal.  Skin: No rash noted. No erythema.  Psychiatric: She has a normal mood and affect. Her behavior is normal. Judgment and thought content normal.  pea sized sq firm ball - lower chin R shoulder is bruised Using a cane L arm AV graft  Lab Results  Component Value Date   WBC 6.4 01/09/2014   HGB 11.8* 01/09/2014   HCT 37.1 01/09/2014   PLT 213.0 01/09/2014   GLUCOSE 200* 04/12/2014   CHOL 164 04/06/2013   TRIG 125 04/06/2013   HDL 73 04/06/2013   LDLDIRECT 89.3 06/13/2006   LDLCALC 66 04/06/2013   ALT 24 01/09/2014   AST 23 01/09/2014   NA 138 04/12/2014   K 3.7 04/12/2014   CL 103 04/12/2014   CREATININE 1.6* 04/12/2014   BUN 27* 04/12/2014   CO2 30 04/12/2014   TSH 5.47* 01/09/2014   INR  3.0 08/07/2015   HGBA1C 7.9* 04/12/2014   MICROALBUR 168.6* 10/31/2006    No results found.  Assessment & Plan:   Diagnoses and all orders for this visit:  Cutaneous abscess of face  Type 2 diabetes mellitus with diabetic nephropathy, with long-term current use of insulin (HCC)  Systolic congestive heart failure, unspecified congestive heart failure chronicity (HCC)  Pain in joint of right shoulder   I am having Ms. Carn maintain her mycophenolate, tacrolimus, dapsone, diphenhydrAMINE, Insulin Pen Needle, magic mouthwash, amLODipine, furosemide, triamcinolone cream, pravastatin, diclofenac sodium, gabapentin, levothyroxine, metoprolol tartrate, insulin lispro, amoxicillin, oxyCODONE, warfarin, ONETOUCH VERIO, and doxycycline.  No orders of the defined types were placed in this encounter.     Follow-up: Return in about  3 months (around 11/11/2015) for a follow-up visit.  Walker Kehr, MD

## 2015-08-13 NOTE — Assessment & Plan Note (Signed)
Chronic   Humalog SS - see Rx

## 2015-08-13 NOTE — Assessment & Plan Note (Addendum)
a lower chin MRSA abscess - s/p I&D at [facility] on 08/07/15 Finish Doxy RTC for another I&D if worse

## 2015-08-15 NOTE — Progress Notes (Signed)
HPI: FU aortic stenosis s/p AVR and atrial fibrillation. Patient has a history of embolic CVA. She has a history of coronary artery disease, status post PCI of the right coronary artery. She underwent cardiac catheterization in September 2014 for severe AS. The right coronary was totally occluded but no other coronary disease noted. She had a pericardial tissue valve and Maze procedure performed. Last echocardiogram 9/14 showed normal LV function, pericardial tissue valve with no aortic insufficiency, mild left atrial enlargement, mild to moderate tricuspid regurgitation and a trivial pericardial effusion. Chest CT April 2016 showed right upper lobe nodule and follow-up recommended in one year. Carotid dopplers 9/15 showed 1-39 bilateral stenosis and fu recommended two years; abnormal density in right thyroid; dedicated thyroid ultrasound showed 2 thyroid nodules; Bx showed benign follicular nodule. Since she was last seen  Current Outpatient Prescriptions  Medication Sig Dispense Refill  . Alum & Mag Hydroxide-Simeth (MAGIC MOUTHWASH) SOLN Take 5 mLs by mouth 4 (four) times daily. Swish, hold and swallow 500 mL 1  . amLODipine (NORVASC) 5 MG tablet Take 0.5 tablets (2.5 mg total) by mouth daily. 30 tablet 6  . amoxicillin (AMOXIL) 500 MG tablet Take all four tablets one hour prior to procedure prn 4 tablet 6  . dapsone 25 MG tablet Take 25 mg by mouth daily.     . diclofenac sodium (VOLTAREN) 1 % GEL Apply 2 g topically 4 (four) times daily. (Patient not taking: Reported on 08/13/2015) 300 g 3  . diphenhydrAMINE (BENADRYL) 50 MG capsule Take 1 capsule (50 mg total) by mouth every 6 (six) hours as needed for itching. 90 capsule 3  . doxycycline (VIBRAMYCIN) 100 MG capsule Take 1 capsule (100 mg total) by mouth 2 (two) times daily. 20 capsule 0  . furosemide (LASIX) 80 MG tablet TAKE 1 TABLET BY MOUTH TWICE A DAY (Patient taking differently: TAKE 1/2 TABLET BY MOUTH TWICE A DAY) 60 tablet 1  .  gabapentin (NEURONTIN) 100 MG capsule Take 1 capsule (100 mg total) by mouth 3 (three) times daily. 90 capsule 5  . insulin lispro (HUMALOG) 100 UNIT/ML KiwkPen If sugar is 140-200 - use 8 units If sugar is  201-230- use 10 units If sugar is  231-260 - use 12 units If sugar is  261- 300- use 14 units If sugar is >301 - use 16 units 15 mL 11  . Insulin Pen Needle 31G X 5 MM MISC Test up to TID dx:250.00 100 each 11  . levothyroxine (LEVOTHROID) 25 MCG tablet Take 2 tablets (50 mcg total) by mouth daily. 60 tablet 11  . metoprolol tartrate (LOPRESSOR) 25 MG tablet TAKE 1 TABLET BY MOUTH TWICE A DAY 60 tablet 10  . mycophenolate (MYFORTIC) 180 MG EC tablet Take 360 mg by mouth 2 (two) times daily.     [name] VERIO test strip USE AS INSTRUCTED 4 TIMES A DAY (BEFORE MEALS) & AT BEDTIME (ICD 10-E11.9) 150 each 4  . oxyCODONE (OXY IR/ROXICODONE) 5 MG immediate release tablet Take 1-2 tablets (5-10 mg total) by mouth every 6 (six) hours as needed. 120 tablet 0  . pravastatin (PRAVACHOL) 40 MG tablet TAKE 1 TABLET BY MOUTH EVERY DAY AS DIRECTED (Patient not taking: Reported on 08/13/2015) 30 tablet 5  . tacrolimus (PROGRAF) 1 MG capsule Take 4 mg by mouth 2 (two) times daily.     Marland Kitchen triamcinolone cream (KENALOG) 0.1 % Apply 1 application topically 2 (two) times daily. (Patient not taking: Reported  on 08/13/2015) 60 g 3  . warfarin (COUMADIN) 5 MG tablet Take as directed by coumadin clinic Dispense 40 tablets 1 month supply disregard 30 tablets 40 tablet 3   No current facility-administered medications for this visit.     Past Medical History  Diagnosis Date  . Coronary artery disease 05/2002    a. Ant MI 2003 s/p PTCA/stent to RCA.   Marland Kitchen Hypertension   . Hyperlipidemia   . CHF (congestive heart failure) ([place])   . Pericardial effusion     a. Small by echo 11/2011.  Marland Kitchen GERD (gastroesophageal reflux disease)   . 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 Unspecified hearing loss   .  Anemia, iron deficiency     of chronic disease  . Helicobacter pylori (H. pylori) infection     hx of  . Esophagitis, reflux   . Gout   . Cholelithiasis   . Hx of colonic polyps     adenomatous  . Diverticulosis of colon   . 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  Marland Kitchen Chronic cough onset 03/2010    Dr Melvyn Novas  . Carotid artery disease ([place])     a. Carotid Dopplers performed in August 2013 showed 40-59% left stenosis and 0-39% right; f/u recommended in 2 years.   . Aortic stenosis     a. Severe AS by echo 11/2012.  . Asystole ([place])     a. During ENT surgery 2005: developed marked asystole requiring CPR, felt due to vagal reaction (cath nonobst dz).  . Myocardial infarction ([place]) 2003  . Cerebrovascular accident [facility]) 2009    a. LMCA infarct felt embolic 2671, maintained on chronic coumadin.; denies residual on 04/05/2013  . Sleep apnea     Pt says testing was positive, intolerant of CPAP.  Marland Kitchen Type II diabetes mellitus ([place])   . ESRD (end stage renal disease) ([place])     a. Mass on L kidney per pt s/p nephrectomy - pt states not cancer - [facility] notes indicate ESRD due to HTN/DM - was previously on HD. b. Kidney transplant 02/2011.  . S/P kidney transplant 03/16/2011  . Chronic Persistent Atrial Fibrillation 12/31/2008    Qualifier: Diagnosis of  By: [name]    . Nodule of right lung 04/07/2013    Ground glass opacity right lung  . S/P aortic valve replacement with bioprosthetic valve and maze procedure 04/12/2013    [facility]   . S/P Maze operation for atrial fibrillation 04/12/2013    Complete bilateral atrial lesion set using bipolar radiofrequency and cryothermy ablation with clipping of LA appendage  . Pruritic dermatitis     treated with steriods/UV light  . Lung nodule seen on  imaging study 04/07/2013    1.0 cm ground glass opacity RUL  . Hemorrhoids     Past Surgical History  Procedure Laterality Date  . Cholecystectomy  2009    with hernia removal  . Arteriovenous graft placement Left   . Av fistula placement Right   . Nasal reconstruction with septal repair      "took it out" (04/05/2013)  . Tubal ligation    . Nephrectomy Left 2010    no CA on bx  . Kidney transplant  03/16/11  . Cardioversion  05/29/2012    Procedure: CARDIOVERSION;  Surgeon:  Lelon Perla, MD;  Location: [facility] ENDOSCOPY;  Service: Cardiovascular;  Laterality: N/A;  . Coronary angioplasty with stent placement Right     coronary artery  . Tonsillectomy    . Total abdominal hysterectomy    . Arteriovenous graft placement Left     "I've had 2 on my left; had one removed" (04/05/2013)   . Av fistula repair Right     "took it out" ((/18/2014)  . Insertion of dialysis catheter Bilateral     "over the years; took them both out" (04/05/2013)  . Intraoperative transesophageal echocardiogram N/A 04/11/2013    Procedure: INTRAOPERATIVE TRANSESOPHAGEAL ECHOCARDIOGRAM;  Surgeon: Rexene Alberts, MD;  Location: [place];  Service: Open Heart Surgery;  Laterality: N/A;  . Artery exploration Right 04/11/2013    Procedure: ARTERY EXPLORATION;  Surgeon: Rexene Alberts, MD;  Location: [place];  Service: Open Heart Surgery;  Laterality: Right;  Right carotid artery exploration  . Aortic valve replacement N/A 04/12/2013    Procedure: AORTIC VALVE REPLACEMENT (AVR);  Surgeon: Rexene Alberts, MD;  Location: [place];  Service: Open Heart Surgery;  Laterality: N/A;  . Maze N/A 04/12/2013    Procedure: MAZE;  Surgeon: Rexene Alberts, MD;  Location: [place];  Service: Open Heart Surgery;  Laterality: N/A;  . Intraoperative transesophageal echocardiogram N/A 04/12/2013    Procedure: INTRAOPERATIVE TRANSESOPHAGEAL ECHOCARDIOGRAM;  Surgeon: Rexene Alberts, MD;  Location: [place];  Service: Open Heart Surgery;  Laterality:  N/A;  . Left and right heart catheterization with coronary angiogram N/A 04/06/2013    Procedure: LEFT AND RIGHT HEART CATHETERIZATION WITH CORONARY ANGIOGRAM;  Surgeon: Blane Ohara, MD;  Location: [facility] CATH LAB;  Service: Cardiovascular;  Laterality: N/A;    Social History   Social History  . Marital Status: Married    Spouse Name: N/A  . Number of Children: 5  . Years of Education: N/A   Occupational History  . retired    Social History Main Topics  . Smoking status: Former Smoker -- 1.00 packs/day for 30 years    Types: Cigarettes    Quit date: 07/19/2001  . Smokeless tobacco: Never Used  . Alcohol Use: No  . Drug Use: No  . Sexual Activity: Not Currently   Other Topics Concern  . Not on file   Social History Narrative   Patient signed a Designated Party Release to allow her spouse [name] and family and five children to have access to her medical records/information.      Family History  Problem Relation Age of Onset  . Stroke Father   . Diabetes Other   . Hypertension Mother     ROS: no fevers or chills, productive cough, hemoptysis, dysphasia, odynophagia, melena, hematochezia, dysuria, hematuria, rash, seizure activity, orthopnea, PND, pedal edema, claudication. Remaining systems are negative.  Physical Exam: Well-developed well-nourished in no acute distress.  Skin is warm and dry.  HEENT is normal.  Neck is supple.  Chest is clear to auscultation with normal expansion.  Cardiovascular exam is regular rate and rhythm.  Abdominal exam nontender or distended. No masses palpated. Extremities show no edema. neuro grossly intact  ECG     This encounter was created in error - please disregard.

## 2015-08-18 ENCOUNTER — Ambulatory Visit: Payer: Medicare Other | Admitting: Pharmacist Clinician (PhC)/ Clinical Pharmacy Specialist

## 2015-08-18 ENCOUNTER — Encounter: Payer: Medicare Other | Admitting: Cardiology

## 2015-09-02 ENCOUNTER — Other Ambulatory Visit: Payer: Self-pay | Admitting: Pharmacist Clinician (PhC)/ Clinical Pharmacy Specialist

## 2015-09-02 MED ORDER — WARFARIN SODIUM 5 MG PO TABS: ORAL_TABLET | ORAL | Status: DC

## 2015-09-04 ENCOUNTER — Ambulatory Visit (INDEPENDENT_AMBULATORY_CARE_PROVIDER_SITE_OTHER): Payer: Medicare Other | Admitting: Pharmacist Clinician (PhC)/ Clinical Pharmacy Specialist

## 2015-09-04 DIAGNOSIS — Z7901 Long term (current) use of anticoagulants: Secondary | ICD-10-CM | POA: Diagnosis not present

## 2015-09-04 DIAGNOSIS — I4891 Unspecified atrial fibrillation: Secondary | ICD-10-CM

## 2015-09-04 DIAGNOSIS — Z5181 Encounter for therapeutic drug level monitoring: Secondary | ICD-10-CM

## 2015-09-04 LAB — POCT INR: INR: 3.1

## 2015-10-02 NOTE — Progress Notes (Signed)
HPI: FU aortic stenosis s/p AVR and atrial fibrillation. Patient has a history of embolic CVA. She has a history of coronary artery disease, status post PCI of the right coronary artery. She underwent cardiac catheterization in September 2014 for severe AS. The right coronary was totally occluded but no other coronary disease noted. She was scheduled for surgery but had a sheath placed into her right carotid artery by mistake. This required surgical repair. Patient subsequently had aortic valve replacement with a pericardial tissue valve. She also had a Maze procedure performed. Last echocardiogram 9/14 showed normal LV function, pericardial tissue valve with no aortic insufficiency, mild left atrial enlargement, mild to moderate tricuspid regurgitation and a trivial pericardial effusion. Carotid dopplers 9/15 showed 1-39 bilateral stenosis and fu recommended two years; abnormal density in right thyroid; dedicated thyroid ultrasound showed 2 thyroid nodules; Bx showed benign follicular nodule. Chest CT April 2016 showed right upper lobe nodule and follow-up recommended in 12-18 months. Since she was last seen She has some dyspnea on exertion but no orthopnea, PND, pedal edema, chest pain or syncope.  Current Outpatient Prescriptions  Medication Sig Dispense Refill  . Alum & Mag Hydroxide-Simeth (MAGIC MOUTHWASH) SOLN Take 5 mLs by mouth 4 (four) times daily. Swish, hold and swallow 500 mL 1  . amLODipine (NORVASC) 5 MG tablet Take 0.5 tablets (2.5 mg total) by mouth daily. 30 tablet 6  . amoxicillin (AMOXIL) 500 MG tablet Take all four tablets one hour prior to procedure prn 4 tablet 6  . dapsone 25 MG tablet Take 25 mg by mouth daily.     . diphenhydrAMINE (BENADRYL) 50 MG capsule Take 1 capsule (50 mg total) by mouth every 6 (six) hours as needed for itching. 90 capsule 3  . furosemide (LASIX) 80 MG tablet TAKE 1 TABLET BY MOUTH TWICE A DAY (Patient taking differently: TAKE 1/2 TABLET BY MOUTH  TWICE A DAY) 60 tablet 1  . gabapentin (NEURONTIN) 100 MG capsule Take 1 capsule (100 mg total) by mouth 3 (three) times daily. 90 capsule 5  . insulin lispro (HUMALOG) 100 UNIT/ML KiwkPen If sugar is 140-200 - use 8 units If sugar is  201-230- use 10 units If sugar is  231-260 - use 12 units If sugar is  261- 300- use 14 units If sugar is >301 - use 16 units 15 mL 11  . Insulin Pen Needle 31G X 5 MM MISC Test up to TID dx:250.00 100 each 11  . levothyroxine (LEVOTHROID) 25 MCG tablet Take 2 tablets (50 mcg total) by mouth daily. 60 tablet 11  . metoprolol tartrate (LOPRESSOR) 25 MG tablet TAKE 1 TABLET BY MOUTH TWICE A DAY 60 tablet 10  . mycophenolate (MYFORTIC) 180 MG EC tablet Take 360 mg by mouth 2 (two) times daily.     [name] VERIO test strip USE AS INSTRUCTED 4 TIMES A DAY (BEFORE MEALS) & AT BEDTIME (ICD 10-E11.9) 150 each 4  . oxyCODONE (OXY IR/ROXICODONE) 5 MG immediate release tablet Take 1-2 tablets (5-10 mg total) by mouth every 6 (six) hours as needed. 120 tablet 0  . pravastatin (PRAVACHOL) 40 MG tablet TAKE 1 TABLET BY MOUTH EVERY DAY AS DIRECTED 30 tablet 5  . tacrolimus (PROGRAF) 1 MG capsule Take 4 mg by mouth 2 (two) times daily.     Marland Kitchen triamcinolone cream (KENALOG) 0.1 % Apply 1 application topically 2 (two) times daily. 60 g 3  . warfarin (COUMADIN) 5 MG tablet Take as  directed by coumadin clinic Dispense 40 tablets 1 month supply disregard 30 tablets 120 tablet 1   No current facility-administered medications for this visit.     Past Medical History  Diagnosis Date  . Coronary artery disease 05/2002    a. Ant MI 2003 s/p PTCA/stent to RCA.   Marland Kitchen Hypertension   . Hyperlipidemia   . CHF (congestive heart failure) ([place])   . Pericardial effusion     a. Small by echo 11/2011.  Marland Kitchen GERD (gastroesophageal reflux disease)   . 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 Unspecified hearing loss   . Anemia, iron deficiency     of chronic disease  .  Helicobacter pylori (H. pylori) infection     hx of  . Esophagitis, reflux   . Gout   . Cholelithiasis   . Hx of colonic polyps     adenomatous  . Diverticulosis of colon   . 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  Marland Kitchen Chronic cough onset 03/2010    Dr Melvyn Novas  . Carotid artery disease ([place])     a. Carotid Dopplers performed in August 2013 showed 40-59% left stenosis and 0-39% right; f/u recommended in 2 years.   . Aortic stenosis     a. Severe AS by echo 11/2012.  . Asystole ([place])     a. During ENT surgery 2005: developed marked asystole requiring CPR, felt due to vagal reaction (cath nonobst dz).  . Myocardial infarction ([place]) 2003  . Cerebrovascular accident [facility]) 2009    a. LMCA infarct felt embolic 2353, maintained on chronic coumadin.; denies residual on 04/05/2013  . Sleep apnea     Pt says testing was positive, intolerant of CPAP.  Marland Kitchen Type II diabetes mellitus ([place])   . ESRD (end stage renal disease) ([place])     a. Mass on L kidney per pt s/p nephrectomy - pt states not cancer - [facility] notes indicate ESRD due to HTN/DM - was previously on HD. b. Kidney transplant 02/2011.  . S/P kidney transplant 03/16/2011  . Chronic Persistent Atrial Fibrillation 12/31/2008    Qualifier: Diagnosis of  By: [name]    . Nodule of right lung 04/07/2013    Ground glass opacity right lung  . S/P aortic valve replacement with bioprosthetic valve and maze procedure 04/12/2013    [facility]   . S/P Maze operation for atrial fibrillation 04/12/2013    Complete bilateral atrial lesion set using bipolar radiofrequency and cryothermy ablation with clipping of LA appendage  . Pruritic dermatitis     treated with steriods/UV light  . Lung nodule seen on imaging study 04/07/2013    1.0 cm ground glass opacity  RUL  . Hemorrhoids     Past Surgical History  Procedure Laterality Date  . Cholecystectomy  2009    with hernia removal  . Arteriovenous graft placement Left   . Av fistula placement Right   . Nasal reconstruction with septal repair      "took it out" (04/05/2013)  . Tubal ligation    . Nephrectomy Left 2010    no CA on bx  . Kidney transplant  03/16/11  . Cardioversion  05/29/2012    Procedure: CARDIOVERSION;  Surgeon: BLelon Perla MD;  [place]  Service: Cardiovascular;  Laterality: N/A;  .  Coronary angioplasty with stent placement Right     coronary artery  . Tonsillectomy    . Total abdominal hysterectomy    . Arteriovenous graft placement Left     "I've had 2 on my left; had one removed" (04/05/2013)   . Av fistula repair Right     "took it out" ((/18/2014)  . Insertion of dialysis catheter Bilateral     "over the years; took them both out" (04/05/2013)  . Intraoperative transesophageal echocardiogram N/A 04/11/2013    Procedure: INTRAOPERATIVE TRANSESOPHAGEAL ECHOCARDIOGRAM;  Surgeon: Rexene Alberts, MD;  Location: [place];  Service: Open Heart Surgery;  Laterality: N/A;  . Artery exploration Right 04/11/2013    Procedure: ARTERY EXPLORATION;  Surgeon: Rexene Alberts, MD;  Location: [place];  Service: Open Heart Surgery;  Laterality: Right;  Right carotid artery exploration  . Aortic valve replacement N/A 04/12/2013    Procedure: AORTIC VALVE REPLACEMENT (AVR);  Surgeon: Rexene Alberts, MD;  Location: [place];  Service: Open Heart Surgery;  Laterality: N/A;  . Maze N/A 04/12/2013    Procedure: MAZE;  Surgeon: Rexene Alberts, MD;  Location[place];  Service: Open Heart Surgery;  Laterality: N/A;  . Intraoperative transesophageal echocardiogram N/A 04/12/2013    Procedure: INTRAOPERATIVE TRANSESOPHAGEAL ECHOCARDIOGRAM;  Surgeon: Rexene Alberts, MD;  Location: [place];  Service: Open Heart Surgery;  Laterality: N/A;  . Left and right heart catheterization with coronary  angiogram N/A 04/06/2013    Procedure: LEFT AND RIGHT HEART CATHETERIZATION WITH CORONARY ANGIOGRAM;  Surgeon: Blane Ohara, MD;  [facility] C[facility] LAB;  Service: Cardiovascular;  Laterality: N/A;    Social History   Social History  . Marital Status: Married    Spouse Name: N/A  . Number of Children: 5  . Years of Education: N/A   Occupational History  . retired    Social History Main Topics  . Smoking status: Former Smoker -- 1.00 packs/day for 30 years    Types: Cigarettes    Quit date: 07/19/2001  . Smokeless tobacco: Never Used  . Alcohol Use: No  . Drug Use: No  . Sexual Activity: Not Currently   Other Topics Concern  . Not on file   Social History Narrative   Patient signed a Designated Party Release to allow her spouse [name] and family and five children to have access to her medical records/information.      Family History  Problem Relation Age of Onset  . Stroke Father   . Diabetes Other   . Hypertension Mother     ROS: no fevers or chills, productive cough, hemoptysis, dysphasia, odynophagia, melena, hematochezia, dysuria, hematuria, rash, seizure activity, orthopnea, PND, pedal edema, claudication. Remaining systems are negative.  Physical Exam: Well-developed obese in no acute distress.  Skin is warm and dry.  HEENT is normal.  Neck is supple.  Chest is clear to auscultation with normal expansion.  Cardiovascular exam is regular rate and rhythm. 2/6 systolic murmur; no diastolic murmur Abdominal exam nontender or distended. No masses palpated. Extremities show no edema. neuro grossly intact  ECG Sinus rhythm with PACs. Normal axis. Left ventricular hypertrophy with repolarization abnormality. Septal infarct.

## 2015-10-07 ENCOUNTER — Ambulatory Visit (INDEPENDENT_AMBULATORY_CARE_PROVIDER_SITE_OTHER): Payer: Medicare Other | Admitting: Pharmacist Clinician (PhC)/ Clinical Pharmacy Specialist

## 2015-10-07 ENCOUNTER — Encounter: Payer: Self-pay | Admitting: Cardiology

## 2015-10-07 ENCOUNTER — Ambulatory Visit (INDEPENDENT_AMBULATORY_CARE_PROVIDER_SITE_OTHER): Payer: Medicare Other | Admitting: Cardiology

## 2015-10-07 VITALS — BP 158/74 | HR 63 | Ht 62.0 in | Wt 187.0 lb

## 2015-10-07 DIAGNOSIS — I151 Hypertension secondary to other renal disorders: Secondary | ICD-10-CM | POA: Diagnosis not present

## 2015-10-07 DIAGNOSIS — I2510 Atherosclerotic heart disease of native coronary artery without angina pectoris: Secondary | ICD-10-CM | POA: Diagnosis not present

## 2015-10-07 DIAGNOSIS — Z5181 Encounter for therapeutic drug level monitoring: Secondary | ICD-10-CM | POA: Diagnosis not present

## 2015-10-07 DIAGNOSIS — Z7901 Long term (current) use of anticoagulants: Secondary | ICD-10-CM | POA: Diagnosis not present

## 2015-10-07 DIAGNOSIS — Z940 Kidney transplant status: Secondary | ICD-10-CM

## 2015-10-07 DIAGNOSIS — Z954 Presence of other heart-valve replacement: Secondary | ICD-10-CM

## 2015-10-07 DIAGNOSIS — I4891 Unspecified atrial fibrillation: Secondary | ICD-10-CM

## 2015-10-07 DIAGNOSIS — R911 Solitary pulmonary nodule: Secondary | ICD-10-CM

## 2015-10-07 DIAGNOSIS — I5032 Chronic diastolic (congestive) heart failure: Secondary | ICD-10-CM | POA: Diagnosis not present

## 2015-10-07 DIAGNOSIS — Z952 Presence of prosthetic heart valve: Secondary | ICD-10-CM

## 2015-10-07 LAB — POCT INR: INR: 1.6

## 2015-10-07 NOTE — Assessment & Plan Note (Signed)
Continue statin. No aspirin given the Coumadin.

## 2015-10-07 NOTE — Assessment & Plan Note (Signed)
Continue SBE prophylaxis. 

## 2015-10-07 NOTE — Assessment & Plan Note (Signed)
Followed by Dr. Roxy Manns.

## 2015-10-07 NOTE — Assessment & Plan Note (Signed)
Patient remains in sinus rhythm. Continue Beta blocker and Coumadin.

## 2015-10-07 NOTE — Assessment & Plan Note (Signed)
Blood pressure is mildly elevated today.However she states it is controlled at home. We will track this and increase medications as needed.

## 2015-10-07 NOTE — Patient Instructions (Signed)
Your physician wants you to follow-up in: ONE YEAR WITH DR CRENSHAW You will receive a reminder letter in the mail two months in advance. If you don't receive a letter, please call our office to schedule the follow-up appointment.   If you need a refill on your cardiac medications before your next appointment, please call your pharmacy.  

## 2015-10-07 NOTE — Assessment & Plan Note (Signed)
Continue statin. 

## 2015-10-07 NOTE — Assessment & Plan Note (Signed)
Follow-up carotid Doppler September 2017. 

## 2015-10-07 NOTE — Assessment & Plan Note (Signed)
Continue present dose of Lasix. 

## 2015-10-08 ENCOUNTER — Ambulatory Visit: Payer: Medicare Other | Admitting: Internal Medicine

## 2015-10-09 ENCOUNTER — Other Ambulatory Visit: Payer: Self-pay | Admitting: *Deleted

## 2015-10-09 DIAGNOSIS — R911 Solitary pulmonary nodule: Secondary | ICD-10-CM

## 2015-10-14 DIAGNOSIS — H11153 Pinguecula, bilateral: Secondary | ICD-10-CM | POA: Diagnosis not present

## 2015-10-14 DIAGNOSIS — H524 Presbyopia: Secondary | ICD-10-CM | POA: Diagnosis not present

## 2015-10-14 DIAGNOSIS — H35033 Hypertensive retinopathy, bilateral: Secondary | ICD-10-CM | POA: Diagnosis not present

## 2015-10-14 DIAGNOSIS — H2513 Age-related nuclear cataract, bilateral: Secondary | ICD-10-CM | POA: Diagnosis not present

## 2015-10-14 DIAGNOSIS — H5211 Myopia, right eye: Secondary | ICD-10-CM | POA: Diagnosis not present

## 2015-10-21 ENCOUNTER — Encounter: Payer: Medicare Other | Admitting: Pharmacist Clinician (PhC)/ Clinical Pharmacy Specialist

## 2015-10-28 ENCOUNTER — Ambulatory Visit (INDEPENDENT_AMBULATORY_CARE_PROVIDER_SITE_OTHER): Payer: Medicare Other | Admitting: Pharmacist Clinician (PhC)/ Clinical Pharmacy Specialist

## 2015-10-28 DIAGNOSIS — Z7901 Long term (current) use of anticoagulants: Secondary | ICD-10-CM

## 2015-10-28 DIAGNOSIS — I4891 Unspecified atrial fibrillation: Secondary | ICD-10-CM

## 2015-10-28 DIAGNOSIS — Z5181 Encounter for therapeutic drug level monitoring: Secondary | ICD-10-CM | POA: Diagnosis not present

## 2015-10-28 LAB — POCT INR: INR: 3.2

## 2015-11-05 DIAGNOSIS — N2581 Secondary hyperparathyroidism of renal origin: Secondary | ICD-10-CM | POA: Diagnosis not present

## 2015-11-05 DIAGNOSIS — Z79899 Other long term (current) drug therapy: Secondary | ICD-10-CM | POA: Diagnosis not present

## 2015-11-05 DIAGNOSIS — N189 Chronic kidney disease, unspecified: Secondary | ICD-10-CM | POA: Diagnosis not present

## 2015-11-05 DIAGNOSIS — Z940 Kidney transplant status: Secondary | ICD-10-CM | POA: Diagnosis not present

## 2015-11-07 DIAGNOSIS — R252 Cramp and spasm: Secondary | ICD-10-CM | POA: Diagnosis not present

## 2015-11-07 DIAGNOSIS — D899 Disorder involving the immune mechanism, unspecified: Secondary | ICD-10-CM | POA: Diagnosis not present

## 2015-11-07 DIAGNOSIS — I10 Essential (primary) hypertension: Secondary | ICD-10-CM | POA: Diagnosis not present

## 2015-11-07 DIAGNOSIS — Z940 Kidney transplant status: Secondary | ICD-10-CM | POA: Diagnosis not present

## 2015-11-07 DIAGNOSIS — R809 Proteinuria, unspecified: Secondary | ICD-10-CM | POA: Diagnosis not present

## 2015-11-10 ENCOUNTER — Ambulatory Visit
Admission: RE | Admit: 2015-11-10 | Discharge: 2015-11-10 | Disposition: A | Discharge status: Home / Self Care | Payer: Medicare Other | Source: Ambulatory Visit | Attending: Thoracic Surgery (Cardiothoracic Vascular Surgery) | Admitting: Thoracic Surgery (Cardiothoracic Vascular Surgery)

## 2015-11-10 ENCOUNTER — Other Ambulatory Visit: Payer: Self-pay | Admitting: *Deleted

## 2015-11-10 ENCOUNTER — Ambulatory Visit (INDEPENDENT_AMBULATORY_CARE_PROVIDER_SITE_OTHER): Payer: Medicare Other | Admitting: Thoracic Surgery (Cardiothoracic Vascular Surgery)

## 2015-11-10 ENCOUNTER — Encounter: Payer: Self-pay | Admitting: Thoracic Surgery (Cardiothoracic Vascular Surgery)

## 2015-11-10 VITALS — BP 164/78 | HR 59 | Resp 18 | Ht 62.0 in | Wt 180.0 lb

## 2015-11-10 DIAGNOSIS — N2889 Other specified disorders of kidney and ureter: Secondary | ICD-10-CM | POA: Diagnosis not present

## 2015-11-10 DIAGNOSIS — R911 Solitary pulmonary nodule: Secondary | ICD-10-CM

## 2015-11-10 DIAGNOSIS — I2510 Atherosclerotic heart disease of native coronary artery without angina pectoris: Secondary | ICD-10-CM | POA: Diagnosis not present

## 2015-11-10 NOTE — Progress Notes (Signed)
[address]       [place] [address]             [phone]     CARDIOTHORACIC SURGERY OFFICE NOTE  Referring Provider is [name], Denice Bors, MD PCP is Walker Kehr, MD   HPI:  Patient returns to the office today for follow-up of small benign-appearing groundglass opacity nodule in the right lung discovered on previous CT imaging. She underwent aortic valve replacement using a bioprosthetic tissue valve and Maze procedure on 04/12/2013. She was last seen here in our office on 11/11/2014.  She is followed carefully by Dr. Alain Marion and Dr. [name] for numerous chronic medical conditions.  She reports no problems with cough or pleuritic chest pain. She denies any history of hemoptysis. She has not had pneumonia.   Current Outpatient Prescriptions  Medication Sig Dispense Refill  . Alum & Mag Hydroxide-Simeth (MAGIC MOUTHWASH) SOLN Take 5 mLs by mouth 4 (four) times daily. Swish, hold and swallow 500 mL 1  . amLODipine (NORVASC) 5 MG tablet Take 0.5 tablets (2.5 mg total) by mouth daily. 30 tablet 6  . amoxicillin (AMOXIL) 500 MG tablet Take all four tablets one hour prior to procedure prn 4 tablet 6  . dapsone 25 MG tablet Take 25 mg by mouth daily.     . diphenhydrAMINE (BENADRYL) 50 MG capsule Take 1 capsule (50 mg total) by mouth every 6 (six) hours as needed for itching. 90 capsule 3  . gabapentin (NEURONTIN) 100 MG capsule Take 1 capsule (100 mg total) by mouth 3 (three) times daily. 90 capsule 5  . insulin lispro (HUMALOG) 100 UNIT/ML KiwkPen If sugar is 140-200 - use 8 units If sugar is  201-230- use 10 units If sugar is  231-260 - use 12 units If sugar is  261- 300- use 14 units If sugar is >301 - use 16 units 15 mL 11  . Insulin Pen Needle 31G X 5 MM MISC Test up to TID dx:250.00 100 each 11  . levothyroxine (LEVOTHROID) 25 MCG tablet Take 2 tablets (50 mcg total) by mouth daily. 60 tablet 11  . metoprolol tartrate (LOPRESSOR) 25 MG tablet TAKE 1  TABLET BY MOUTH TWICE A DAY 60 tablet 10  . mycophenolate (MYFORTIC) 180 MG EC tablet Take 360 mg by mouth 2 (two) times daily.     [name] VERIO test strip USE AS INSTRUCTED 4 TIMES A DAY (BEFORE MEALS) & AT BEDTIME (ICD 10-E11.9) 150 each 4  . oxyCODONE (OXY IR/ROXICODONE) 5 MG immediate release tablet Take 1-2 tablets (5-10 mg total) by mouth every 6 (six) hours as needed. 120 tablet 0  . tacrolimus (PROGRAF) 1 MG capsule Take 4 mg by mouth 2 (two) times daily.     Marland Kitchen warfarin (COUMADIN) 5 MG tablet Take as directed by coumadin clinic Dispense 40 tablets 1 month supply disregard 30 tablets 120 tablet 1   No current facility-administered medications for this visit.      Physical Exam:   BP 164/78 mmHg  Pulse 59  Resp 18  Ht '5\' 2"'$  (1.575 m)  Wt 180 lb (81.647 kg)  BMI 32.91 kg/m2  SpO2 96%  General:  Obese but well appearing  Chest:   Clear to auscultation  CV:   Regular rate and rhythm with soft systolic murmur  Incisions:  n/a  Abdomen:  Soft nontender  Extremities:  Warm and well-perfused  Diagnostic Tests:  CT CHEST WITHOUT CONTRAST  TECHNIQUE: Multidetector  CT imaging of the chest was performed following the standard protocol without IV contrast.  COMPARISON: 11/11/2014  FINDINGS: Mediastinum/Lymph Nodes: No masses or pathologically enlarged lymph nodes identified on this un-enhanced exam. Extensive arterial vascular calcification. Clip on the left atrial appendage. Aortic valve replacement. Aortic atherosclerosis.  Lungs/Pleura: Again noted is a 10 x 9 mm ground-glass density on image 20 of series 4 in the right upper lobe, essentially unchanged since the prior study of 04/07/2013. There is a vague a 6 x 5 mm in the posterior aspect of the right upper lobe on image 34 of series 4 which is slightly more prominent than on the prior study. There is slight scarring posteriorly in the right lower lobe just above the diaphragm in this appears less prominent  than on 08/13/2013.  Upper abdomen: There is a 2.8 cm mass on the posterior aspect of the mid right kidney. On this noncontrast study it is not possible to tell whether this represents an enlarging cyst or a solid mass. Right renal ultrasound recommended for further characterization.  Musculoskeletal: No chest wall mass or suspicious bone lesions identified.  IMPRESSION: Stable 10 x 9 mm (9.5 mm average diameter) ground-glass density in the right upper lobe. Slightly more prominent 6 x 5 mm (5.54m average diameter) ground-glass density in the posterior aspect of the right upper lobe. Repeat CT is recommended every 2 years until 5 years of stability has been established. This recommendation follows the consensus statement: Guidelines for Management of Incidental Pulmonary Nodules Detected on CT Images:From the Fleischner Society 2017; published online before print (10.1148/[id number].   Electronically Signed  By: JLorriane ShireM.D.  On: 11/10/2015 14:50   Impression:  Stable radiographic appearance of small (10x935m benign appearing ground glass density in the right upper lobe.A second smaller ground-glass density is noted in the posterior right upper lobe. Follow-up CT imaging has been recommended in approximately 2 years per guidelines.  The interpreting radiologist also noted a small nodule in the right kidney that was incompletely evaluated by noncontrast CT. Renal ultrasound was recommended.  Plan:  The patient will undergo renal ultrasound. We will otherwise plan for her to return in 2 years for repeat non-contrast CT scan of the chest.  I spent in excess of 10 minutes during the conduct of this office consultation and >50% of this time involved direct face-to-face encounter with the patient for counseling and/or coordination of their care.  ClValentina GuOwRoxy MannsMD 11/10/2015 4:19 PM

## 2015-11-10 NOTE — Patient Instructions (Signed)
Continue all previous medications without any changes at this time  

## 2015-11-11 ENCOUNTER — Ambulatory Visit: Payer: Medicare Other | Admitting: Internal Medicine

## 2015-11-11 ENCOUNTER — Ambulatory Visit (INDEPENDENT_AMBULATORY_CARE_PROVIDER_SITE_OTHER): Payer: Medicare Other | Admitting: Pharmacist Clinician (PhC)/ Clinical Pharmacy Specialist

## 2015-11-11 DIAGNOSIS — Z5181 Encounter for therapeutic drug level monitoring: Secondary | ICD-10-CM | POA: Diagnosis not present

## 2015-11-11 DIAGNOSIS — I4891 Unspecified atrial fibrillation: Secondary | ICD-10-CM

## 2015-11-11 LAB — POCT INR: INR: 3

## 2015-11-14 ENCOUNTER — Ambulatory Visit
Admission: RE | Admit: 2015-11-14 | Discharge: 2015-11-14 | Disposition: A | Discharge status: Home / Self Care | Payer: Medicare Other | Source: Ambulatory Visit | Attending: Thoracic Surgery (Cardiothoracic Vascular Surgery) | Admitting: Thoracic Surgery (Cardiothoracic Vascular Surgery)

## 2015-11-14 DIAGNOSIS — N2889 Other specified disorders of kidney and ureter: Secondary | ICD-10-CM

## 2015-11-17 ENCOUNTER — Telehealth: Payer: Self-pay | Admitting: Thoracic Surgery (Cardiothoracic Vascular Surgery)

## 2015-11-17 ENCOUNTER — Encounter: Payer: Self-pay | Admitting: Thoracic Surgery (Cardiothoracic Vascular Surgery)

## 2015-11-17 NOTE — Telephone Encounter (Signed)
Discussed results of renal ultrasound with patient.  No follow up required.  Rexene Alberts, MD 11/17/2015 9:24 AM    RENAL / URINARY TRACT ULTRASOUND COMPLETE  COMPARISON: CT scan of chest of November 10, 2015.  FINDINGS: Right Kidney:  Length: 8.2 cm. No hydronephrosis is noted. Increased echogenicity is noted. 2.4 cm simple cyst is seen in upper pole which corresponds to abnormality seen on CT scan. Septated cyst is seen in lower pole measuring 1.5 cm consistent with Bosniak type 2 lesion.  Left kidney not present.  Transplanted kidney in left lower quadrant measures 12 cm in length. No hydronephrosis is noted. Normal in echogenicity.  Bladder:  Appears normal for degree of bladder distention. Ureteral jets are not visualized.  IMPRESSION: Right renal atrophy is noted. 2.4 cm simple cyst is seen in upper pole which corresponds to abnormality seen on CT scan. 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 No definite mass is noted.   Electronically Signed  By: Marijo Conception, M.D.  On: 11/14/2015 14:23

## 2015-11-19 DIAGNOSIS — N184 Chronic kidney disease, stage 4 (severe): Secondary | ICD-10-CM | POA: Diagnosis not present

## 2015-11-21 ENCOUNTER — Encounter: Payer: Self-pay | Admitting: Internal Medicine

## 2015-11-21 ENCOUNTER — Ambulatory Visit (INDEPENDENT_AMBULATORY_CARE_PROVIDER_SITE_OTHER): Payer: Medicare Other | Admitting: Internal Medicine

## 2015-11-21 VITALS — BP 140/60 | HR 68 | Wt 179.0 lb

## 2015-11-21 DIAGNOSIS — N281 Cyst of kidney, acquired: Secondary | ICD-10-CM

## 2015-11-21 DIAGNOSIS — M25512 Pain in left shoulder: Secondary | ICD-10-CM | POA: Diagnosis not present

## 2015-11-21 DIAGNOSIS — Z940 Kidney transplant status: Secondary | ICD-10-CM

## 2015-11-21 DIAGNOSIS — Q6100 Congenital renal cyst, unspecified: Secondary | ICD-10-CM

## 2015-11-21 DIAGNOSIS — I151 Hypertension secondary to other renal disorders: Secondary | ICD-10-CM | POA: Diagnosis not present

## 2015-11-21 DIAGNOSIS — M5440 Lumbago with sciatica, unspecified side: Secondary | ICD-10-CM | POA: Diagnosis not present

## 2015-11-21 DIAGNOSIS — I2510 Atherosclerotic heart disease of native coronary artery without angina pectoris: Secondary | ICD-10-CM

## 2015-11-21 MED ORDER — METHYLPREDNISOLONE ACETATE 40 MG/ML IJ SUSP
40.0000 mg | Freq: Once | INTRAMUSCULAR | Status: AC
  Administered 2015-11-21: 40 mg via INTRA_ARTICULAR

## 2015-11-21 MED ORDER — OXYCODONE-ACETAMINOPHEN 5-325 MG PO TABS: 0.5000 | ORAL_TABLET | Freq: Four times a day (QID) | ORAL | Status: DC | PRN

## 2015-11-21 NOTE — Progress Notes (Signed)
Subjective:  [name], female    DOB: 11-09-1945  Age: 70 y.o. MRN: [id number]  CC: No chief complaint on file.   HPI [name] presents for CRI, HTN, CAD f/u C/o L shoulder pain - worse  Outpatient Prescriptions Prior to Visit  Medication Sig Dispense Refill  . Alum & Mag Hydroxide-Simeth (MAGIC MOUTHWASH) SOLN Take 5 mLs by mouth 4 (four) times daily. Swish, hold and swallow 500 mL 1  . amLODipine (NORVASC) 5 MG tablet Take 0.5 tablets (2.5 mg total) by mouth daily. 30 tablet 6  . amoxicillin (AMOXIL) 500 MG tablet Take all four tablets one hour prior to procedure prn 4 tablet 6  . dapsone 25 MG tablet Take 25 mg by mouth daily.     . diphenhydrAMINE (BENADRYL) 50 MG capsule Take 1 capsule (50 mg total) by mouth every 6 (six) hours as needed for itching. 90 capsule 3  . gabapentin (NEURONTIN) 100 MG capsule Take 1 capsule (100 mg total) by mouth 3 (three) times daily. 90 capsule 5  . insulin lispro (HUMALOG) 100 UNIT/ML KiwkPen If sugar is 140-200 - use 8 units If sugar is  201-230- use 10 units If sugar is  231-260 - use 12 units If sugar is  261- 300- use 14 units If sugar is >301 - use 16 units 15 mL 11  . Insulin Pen Needle 31G X 5 MM MISC Test up to TID dx:250.00 100 each 11  . levothyroxine (LEVOTHROID) 25 MCG tablet Take 2 tablets (50 mcg total) by mouth daily. 60 tablet 11  . metoprolol tartrate (LOPRESSOR) 25 MG tablet TAKE 1 TABLET BY MOUTH TWICE A DAY 60 tablet 10  . mycophenolate (MYFORTIC) 180 MG EC tablet Take 360 mg by mouth 2 (two) times daily.     [name] VERIO test strip USE AS INSTRUCTED 4 TIMES A DAY (BEFORE MEALS) & AT BEDTIME (ICD 10-E11.9) 150 each 4  . oxyCODONE (OXY IR/ROXICODONE) 5 MG immediate release tablet Take 1-2 tablets (5-10 mg total) by mouth every 6 (six) hours as needed. 120 tablet 0  . tacrolimus (PROGRAF) 1 MG capsule Take 4 mg by mouth 2 (two) times daily.     Marland Kitchen warfarin (COUMADIN) 5 MG tablet Take as directed by  coumadin clinic Dispense 40 tablets 1 month supply disregard 30 tablets 120 tablet 1   No facility-administered medications prior to visit.    ROS Review of Systems  Constitutional: Positive for fatigue. Negative for chills, activity change, appetite change and unexpected weight change.  HENT: Negative for congestion, mouth sores and sinus pressure.   Eyes: Negative for visual disturbance.  Respiratory: Negative for cough and chest tightness.   Gastrointestinal: Negative for nausea and abdominal pain.  Genitourinary: Negative for frequency, difficulty urinating and vaginal pain.  Musculoskeletal: Positive for arthralgias. Negative for back pain and gait problem.  Skin: Negative for pallor and rash.  Neurological: Positive for weakness and headaches. Negative for dizziness, tremors and numbness.  Psychiatric/Behavioral: Negative for suicidal ideas, confusion and sleep disturbance. The patient is not nervous/anxious.     Objective:  BP 140/60 mmHg  Pulse 68  Wt 179 lb (81.194 kg)  SpO2 97%  BP Readings from Last 3 Encounters:  11/21/15 140/60  11/10/15 164/78  10/07/15 158/74    Wt Readings from Last 3 Encounters:  11/21/15 179 lb (81.194 kg)  11/10/15 180 lb (81.647 kg)  10/07/15 187 lb (84.823 kg)    Physical Exam  Constitutional:  She appears well-developed. No distress.  HENT:  Head: Normocephalic.  Right Ear: External ear normal.  Left Ear: External ear normal.  Nose: Nose normal.  Mouth/Throat: Oropharynx is clear and moist.  Eyes: Conjunctivae are normal. Pupils are equal, round, and reactive to light. Right eye exhibits no discharge. Left eye exhibits no discharge.  Neck: Normal range of motion. Neck supple. No JVD present. No tracheal deviation present. No thyromegaly present.  Cardiovascular: Normal rate, regular rhythm and normal heart sounds.   Pulmonary/Chest: No stridor. No respiratory distress. She has no wheezes.  Abdominal: Soft. Bowel sounds are normal.  She exhibits no distension and no mass. There is no tenderness. There is no rebound and no guarding.  Musculoskeletal: She exhibits tenderness. She exhibits no edema.  Lymphadenopathy:    She has no cervical adenopathy.  Neurological: She displays normal reflexes. No cranial nerve deficit. She exhibits normal muscle tone. Coordination abnormal.  Skin: No rash noted. No erythema.  Psychiatric: She has a normal mood and affect. Her behavior is normal. Judgment and thought content normal.  Cane L shoulder is tender w/ROM and over poster edge of the L deltoid and brachial muscles    Procedure Note :    Trigger Point Injection   Indication : Focal tender area identifiable by the location without other identifiable neurologic or musculoskeletal finding or pathology.L shoulder is tender over poster edge of the L deltoid and brachial muscles   Risks including unsuccessful procedure , bleeding on Coumadin, infection, bruising, skin atrophy and others were explained to the patient in detail as well as the benefits. Informed consent was obtained and signed.   Tthe patient was placed in a comfortable position.  4  points of maximum tenderness overL shoulder is tender w/ROM and over poster edge of the L deltoid and brachial muscles  were marked and  the skin was prepped with Betadine and alcohol. 1 inch 25-gauge needle was used. The needle was advanced perpendicular to the skin. Each trigger point was injected with 1 mL of 2% lidocaine and 10 mg of Depo-Medrol in a usual fashion.  Band-Aids applied.   Tolerated well. Complications: None. Good pain relief following the procedure.   Lab Results  Component Value Date   WBC 6.4 01/09/2014   HGB 11.8* 01/09/2014   HCT 37.1 01/09/2014   PLT 213.0 01/09/2014   GLUCOSE 200* 04/12/2014   CHOL 164 04/06/2013   TRIG 125 04/06/2013   HDL 73 04/06/2013   LDLDIRECT 89.3 06/13/2006   LDLCALC 66 04/06/2013   ALT 24 01/09/2014   AST 23 01/09/2014   NA 138  04/12/2014   K 3.7 04/12/2014   CL 103 04/12/2014   CREATININE 1.6* 04/12/2014   BUN 27* 04/12/2014   CO2 30 04/12/2014   TSH 5.47* 01/09/2014   INR 3.0 11/11/2015   HGBA1C 7.9* 04/12/2014   MICROALBUR 168.6* 10/31/2006    US Renal  11/14/2015  CLINICAL DATA:  Renal mass. EXAM: RENAL / URINARY TRACT ULTRASOUND COMPLETE COMPARISON:  CT scan of chest of November 10, 2015. FINDINGS: Right Kidney: Length: 8.2 cm. No hydronephrosis is noted. Increased echogenicity is noted. 2.4 cm simple cyst is seen in upper pole which corresponds to abnormality seen on CT scan. Septated cyst is seen in lower pole measuring 1.5 cm consistent with Bosniak type 2 lesion. Left kidney not present. Transplanted kidney in left lower quadrant measures 12 cm in length. No hydronephrosis is noted. Normal in echogenicity. Bladder: Appears normal for degree of bladder  distention. Ureteral jets are not visualized. IMPRESSION: Right renal atrophy is noted. 2.4 cm simple cyst is seen in upper pole which corresponds to abnormality seen on CT scan. 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 No definite mass is noted. Electronically Signed   By: Marijo Conception, M.D.   On: 11/14/2015 14:23    Assessment & Plan:   There are no diagnoses linked to this encounter. I am having Ms. Stratmann maintain her mycophenolate, tacrolimus, dapsone, diphenhydrAMINE, Insulin Pen Needle, magic mouthwash, amLODipine, gabapentin, levothyroxine, metoprolol tartrate, insulin lispro, amoxicillin, oxyCODONE, ONETOUCH VERIO, and warfarin.  No orders of the defined types were placed in this encounter.     Follow-up: No Follow-up on file.  Walker Kehr, MD

## 2015-11-21 NOTE — Assessment & Plan Note (Signed)
5/17 change to Percocet so that she could break tab in 1/4 or 1/2's  Potential benefits of a long term oxycodone use as well as potential risks (i.e. addiction risk (low), apnea etc) and complications (i.e. Somnolence, seizures, constipation and others) were explained to the patient and were aknowledged.

## 2015-11-21 NOTE — Assessment & Plan Note (Signed)
L shoulder is tender w/ROM and over poster edge of the L deltoid and brachial muscles - will inject trigger points

## 2015-11-21 NOTE — Assessment & Plan Note (Signed)
Amlodipine, Metoprolol, Furosemide Mouth very dry from clonidine, try taper down/ ? Off 10/15/2010 > taper to one half twice daily 11/24/2010  BP is nl at home

## 2015-11-21 NOTE — Assessment & Plan Note (Signed)
Renal US IMPRESSION: Right renal atrophy is noted. 2.4 cm simple cyst is seen in upper pole which corresponds to abnormality seen on CT scan. 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 No definite mass is noted.   Electronically Signed  By: Marijo Conception, M.D.  On: 11/14/2015 14:23

## 2015-11-21 NOTE — Progress Notes (Signed)
Pre visit review using our clinic review tool, if applicable. No additional management support is needed unless otherwise documented below in the visit note. 

## 2015-12-02 ENCOUNTER — Ambulatory Visit (HOSPITAL_COMMUNITY)
Admission: EM | Admit: 2015-12-02 | Discharge: 2015-12-02 | Disposition: A | Discharge status: Home / Self Care | Payer: Medicare Other | Attending: Family Medicine | Admitting: Family Medicine

## 2015-12-02 ENCOUNTER — Ambulatory Visit (INDEPENDENT_AMBULATORY_CARE_PROVIDER_SITE_OTHER): Payer: Medicare Other | Admitting: Pharmacist

## 2015-12-02 ENCOUNTER — Encounter (HOSPITAL_COMMUNITY): Payer: Self-pay | Admitting: Emergency Medicine

## 2015-12-02 DIAGNOSIS — I4891 Unspecified atrial fibrillation: Secondary | ICD-10-CM

## 2015-12-02 DIAGNOSIS — Z5181 Encounter for therapeutic drug level monitoring: Secondary | ICD-10-CM | POA: Diagnosis not present

## 2015-12-02 DIAGNOSIS — H109 Unspecified conjunctivitis: Secondary | ICD-10-CM | POA: Diagnosis not present

## 2015-12-02 LAB — POCT INR: INR: 3.3

## 2015-12-02 MED ORDER — GENTAMICIN SULFATE 0.3 % OP SOLN: 1.0000 [drp] | OPHTHALMIC | Status: DC

## 2015-12-02 NOTE — ED Provider Notes (Signed)
CSN: [id number]     Arrival date & time 12/02/15  1710 History   First MD Initiated Contact with Patient 12/02/15 1903     Chief Complaint  Patient presents with  . Eye Problem   (Consider location/radiation/quality/duration/timing/severity/associated sxs/prior Treatment) HPI History obtained from patient:  Pt presents with the cc of: red eye irritation Duration of symptoms:2 days Treatment prior to arrival:cold compresses Context:expsed to granddaughter with eye infection Other symptoms include:cough Pain score: 0 FAMILY HISTORY: DIABETES AND HYPERTENSION    Past Medical History  Diagnosis Date  . Coronary artery disease 05/2002    a. Ant MI 2003 s/p PTCA/stent to RCA.   Marland Kitchen Hypertension   . Hyperlipidemia   . CHF (congestive heart failure) ([place])   . Pericardial effusion     a. Small by echo 11/2011.  Marland Kitchen GERD (gastroesophageal reflux disease)   . 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 Unspecified hearing loss   . Anemia, iron deficiency     of chronic disease  . Helicobacter pylori (H. pylori) infection     hx of  . Esophagitis, reflux   . Gout   . Cholelithiasis   . Hx of colonic polyps     adenomatous  . Diverticulosis of colon   . 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  Marland Kitchen Chronic cough onset 03/2010    Dr Melvyn Novas  . Carotid artery disease ([place])     a. Carotid Dopplers performed in August 2013 showed 40-59% left stenosis and 0-39% right; f/u recommended in 2 years.   . Aortic stenosis     a. Severe AS by echo 11/2012.  . Asystole ([place])     a. During ENT surgery 2005: developed marked asystole requiring CPR, felt due to vagal reaction (cath nonobst dz).  . Myocardial infarction ([place]) 2003  . Cerebrovascular accident [facility]) 2009    a. LMCA infarct felt embolic [phone], maintained on chronic  coumadin.; denies residual on 04/05/2013  . Sleep apnea     Pt says testing was positive, intolerant of CPAP.  Marland Kitchen Type II diabetes mellitus ([place])   . ESRD (end stage renal disease) ([place])     a. Mass on L kidney per pt s/p nephrectomy - pt states not cancer - [facility] notes indicate ESRD due to HTN/DM - was previously on HD. b. Kidney transplant 02/2011.  Marland Kitchen Chronic Persistent Atrial Fibrillation 12/31/2008    Qualifier: Diagnosis of  By: [name]    . S/P aortic valve replacement with bioprosthetic valve and maze procedure 04/12/2013    [facility]   . S/P Maze operation for atrial fibrillation 04/12/2013    Complete bilateral atrial lesion set using bipolar radiofrequency and cryothermy ablation with clipping of LA appendage  . Pruritic dermatitis     treated with steriods/UV light  . Lung nodule seen on imaging study 04/07/2013    1.0 cm ground glass opacity RUL  . Hemorrhoids    Past Surgical History  Procedure Laterality Date  . Cholecystectomy  2009    with hernia removal  . Arteriovenous graft placement Left   . Av fistula placement Right   . Nasal reconstruction with septal repair      "took it out" (04/05/2013)  . Tubal ligation    . Nephrectomy Left 2010  no CA on bx  . Kidney transplant  03/16/11  . Cardioversion  05/29/2012    Procedure: CARDIOVERSION;  Surgeon: Lelon Perla, MD;  Location: [facility] ENDOSCOPY;  Service: Cardiovascular;  Laterality: N/A;  . Coronary angioplasty with stent placement Right     coronary artery  . Tonsillectomy    . Total abdominal hysterectomy    . Arteriovenous graft placement Left     "I've had 2 on my left; had one removed" (04/05/2013)   . Av fistula repair Right     "took it out" ((/18/2014)  . Insertion of dialysis catheter Bilateral     "over the years; took them both out" (04/05/2013)  . Intraoperative transesophageal echocardiogram N/A 04/11/2013    Procedure: INTRAOPERATIVE TRANSESOPHAGEAL  ECHOCARDIOGRAM;  Surgeon: Rexene Alberts, MD;  Location: [place];  Service: Open Heart Surgery;  Laterality: N/A;  . Artery exploration Right 04/11/2013    Procedure: ARTERY EXPLORATION;  Surgeon: Rexene Alberts, MD;  Location: [place];  Service: Open Heart Surgery;  Laterality: Right;  Right carotid artery exploration  . Aortic valve replacement N/A 04/12/2013    Procedure: AORTIC VALVE REPLACEMENT (AVR);  Surgeon: Rexene Alberts, MD;  Location: [place];  Service: Open Heart Surgery;  Laterality: N/A;  . Maze N/A 04/12/2013    Procedure: MAZE;  Surgeon: Rexene Alberts, MD;  Location: [place];  Service: Open Heart Surgery;  Laterality: N/A;  . Intraoperative transesophageal echocardiogram N/A 04/12/2013    Procedure: INTRAOPERATIVE TRANSESOPHAGEAL ECHOCARDIOGRAM;  Surgeon: Rexene Alberts, MD;  Location: [place];  Service: Open Heart Surgery;  Laterality: N/A;  . Left and right heart catheterization with coronary angiogram N/A 04/06/2013    Procedure: LEFT AND RIGHT HEART CATHETERIZATION WITH CORONARY ANGIOGRAM;  Surgeon: Blane Ohara, MD;  Location[facility] CATH LAB;  Service: Cardiovascular;  Laterality: N/A;   Family History  Problem Relation Age of Onset  . Stroke Father   . Diabetes Other   . Hypertension Mother    Social History  Substance Use Topics  . Smoking status: Former Smoker -- 1.00 packs/day for 30 years    Types: Cigarettes    Quit date: 07/19/2001  . Smokeless tobacco: Never Used  . Alcohol Use: No   OB History    No data available     Review of Systems ROS +'ve  Denies: HEADACHE, NAUSEA, ABDOMINAL PAIN, CHEST PAIN, CONGESTION, DYSURIA, SHORTNESS OF BREATH  Allergies  Ibuprofen; Sulfamethoxazole-trimethoprim; Sulfonamide derivatives; Tape; Tramadol; Bactrim; and Red dye  Home Medications   Prior to Admission medications   Medication Sig Start Date End Date Taking? Authorizing Provider  Alum & Mag Hydroxide-Simeth (MAGIC MOUTHWASH) SOLN Take 5 mLs by mouth 4 (four) times  daily. Swish, hold and swallow 10/09/13   Aleksei Plotnikov V, MD  amLODipine (NORVASC) 5 MG tablet Take 0.5 tablets (2.5 mg total) by mouth daily. 06/24/14   Lelon Perla, MD  amoxicillin (AMOXIL) 500 MG tablet Take all four tablets one hour prior to procedure prn 06/06/15   [name], MD  dapsone 25 MG tablet Take 25 mg by mouth daily.     Historical Provider, MD  diphenhydrAMINE (BENADRYL) 50 MG capsule Take 1 capsule (50 mg total) by mouth every 6 (six) hours as needed for itching. 08/31/11   Aleksei Plotnikov V, MD  gabapentin (NEURONTIN) 100 MG capsule Take 1 capsule (100 mg total) by mouth 3 (three) times daily. 01/10/15   Aleksei Plotnikov V, MD  insulin lispro (HUMALOG)  100 UNIT/ML KiwkPen If sugar is 140-200 - use 8 units If sugar is  201-230- use 10 units If sugar is  231-260 - use 12 units If sugar is  261- 300- use 14 units If sugar is >301 - use 16 units 06/06/15   Aleksei Plotnikov V, MD  Insulin Pen Needle 31G X 5 MM MISC Test up to TID dx:250.00 06/27/13   Aleksei Plotnikov V, MD  levothyroxine (LEVOTHROID) 25 MCG tablet Take 2 tablets (50 mcg total) by mouth daily. 03/25/15   Aleksei Plotnikov V, MD  metoprolol tartrate (LOPRESSOR) 25 MG tablet TAKE 1 TABLET BY MOUTH TWICE A DAY 06/04/15   Lelon Perla, MD  mycophenolate (MYFORTIC) 180 MG EC tablet Take 360 mg by mouth 2 (two) times daily.     Historical Provider, MD  ONETOUCH VERIO test strip USE AS INSTRUCTED 4 TIMES A DAY (BEFORE MEALS) & AT BEDTIME (ICD 10-E11.9) 07/22/15   Lew Dawes V, MD  oxyCODONE-acetaminophen (ROXICET) 5-325 MG tablet Take 0.5-1 tablets by mouth every 6 (six) hours as needed for severe pain. 11/21/15   Aleksei Plotnikov V, MD  tacrolimus (PROGRAF) 1 MG capsule Take 4 mg by mouth 2 (two) times daily.     Historical Provider, MD  warfarin (COUMADIN) 5 MG tablet Take as directed by coumadin clinic Dispense 40 tablets 1 month supply disregard 30 tablets 09/02/15   Lelon Perla, MD   Meds  Ordered and Administered this Visit  Medications - No data to display  BP 173/76 mmHg  Pulse 68  Temp(Src) 98.3 F (36.8 C) (Oral)  Resp 18  SpO2 97% No data found.   Physical Exam NURSES NOTES AND VITAL SIGNS REVIEWED. CONSTITUTIONAL: Well developed, well nourished, no acute distress HEENT: normocephalic, atraumatic EYES: Conjunctiva normal NECK:normal ROM, supple, no adenopathy PULMONARY:No respiratory distress, normal effort ABDOMINAL: Soft, ND, NT BS+, No CVAT MUSCULOSKELETAL: Normal ROM of all extremities,  SKIN: warm and dry without rash PSYCHIATRIC: Mood and affect, behavior are normal  ED Course  Procedures (including critical care time)  Labs Review Labs Reviewed - No data to display  Imaging Review No results found.   Visual Acuity Review  Right Eye Distance:   Left Eye Distance:   Bilateral Distance:    Right Eye Near:   Left Eye Near:    Bilateral Near:      rX FOR GENT DROPS, PT WITH MULTIPLE ALLERGIES   MDM   1. Bilateral conjunctivitis      Patient is reassured that there are no issues that require transfer to higher level of care at this time or additional tests. Patient is advised to continue home symptomatic treatment. Patient is advised that if there are new or worsening symptoms to attend the emergency department, contact primary care provider, or return to UC. Instructions of care provided discharged home in stable condition.    THIS NOTE WAS GENERATED USING A VOICE RECOGNITION SOFTWARE PROGRAM. ALL REASONABLE EFFORTS  WERE MADE TO PROOFREAD THIS DOCUMENT FOR ACCURACY.  I have verbally reviewed the discharge instructions with the patient. A printed AVS was given to the patient.  All questions were answered prior to discharge.      Konrad Felix, [place] 12/03/15 [phone]

## 2015-12-02 NOTE — ED Notes (Signed)
The patient presented to the [facility] with a complaint of bilateral eye drainage, swelling and some moderate pain that started 5 days prior.

## 2015-12-02 NOTE — Discharge Instructions (Signed)

## 2015-12-16 ENCOUNTER — Ambulatory Visit (INDEPENDENT_AMBULATORY_CARE_PROVIDER_SITE_OTHER): Payer: Medicare Other | Admitting: Pharmacist Clinician (PhC)/ Clinical Pharmacy Specialist

## 2015-12-16 DIAGNOSIS — I4891 Unspecified atrial fibrillation: Secondary | ICD-10-CM

## 2015-12-16 DIAGNOSIS — Z5181 Encounter for therapeutic drug level monitoring: Secondary | ICD-10-CM

## 2015-12-16 LAB — POCT INR: INR: 2.8

## 2016-01-13 ENCOUNTER — Ambulatory Visit (INDEPENDENT_AMBULATORY_CARE_PROVIDER_SITE_OTHER): Payer: Medicare Other | Admitting: Pharmacist Clinician (PhC)/ Clinical Pharmacy Specialist

## 2016-01-13 DIAGNOSIS — Z5181 Encounter for therapeutic drug level monitoring: Secondary | ICD-10-CM | POA: Diagnosis not present

## 2016-01-13 DIAGNOSIS — I4891 Unspecified atrial fibrillation: Secondary | ICD-10-CM

## 2016-01-13 LAB — POCT INR: INR: 2.8

## 2016-01-16 DIAGNOSIS — N189 Chronic kidney disease, unspecified: Secondary | ICD-10-CM | POA: Diagnosis not present

## 2016-01-16 DIAGNOSIS — Z79899 Other long term (current) drug therapy: Secondary | ICD-10-CM | POA: Diagnosis not present

## 2016-01-16 DIAGNOSIS — Z940 Kidney transplant status: Secondary | ICD-10-CM | POA: Diagnosis not present

## 2016-01-22 DIAGNOSIS — D899 Disorder involving the immune mechanism, unspecified: Secondary | ICD-10-CM | POA: Diagnosis not present

## 2016-01-22 DIAGNOSIS — Z940 Kidney transplant status: Secondary | ICD-10-CM | POA: Diagnosis not present

## 2016-01-22 DIAGNOSIS — E669 Obesity, unspecified: Secondary | ICD-10-CM | POA: Diagnosis not present

## 2016-01-22 DIAGNOSIS — I10 Essential (primary) hypertension: Secondary | ICD-10-CM | POA: Diagnosis not present

## 2016-01-22 DIAGNOSIS — R809 Proteinuria, unspecified: Secondary | ICD-10-CM | POA: Diagnosis not present

## 2016-02-24 ENCOUNTER — Ambulatory Visit (INDEPENDENT_AMBULATORY_CARE_PROVIDER_SITE_OTHER): Payer: Medicare Other | Admitting: Pharmacist Clinician (PhC)/ Clinical Pharmacy Specialist

## 2016-02-24 DIAGNOSIS — Z5181 Encounter for therapeutic drug level monitoring: Secondary | ICD-10-CM

## 2016-02-24 DIAGNOSIS — I4891 Unspecified atrial fibrillation: Secondary | ICD-10-CM | POA: Diagnosis not present

## 2016-02-24 LAB — POCT INR: INR: 2.4

## 2016-02-25 ENCOUNTER — Encounter: Payer: Self-pay | Admitting: Internal Medicine

## 2016-02-25 ENCOUNTER — Ambulatory Visit (INDEPENDENT_AMBULATORY_CARE_PROVIDER_SITE_OTHER): Payer: Medicare Other | Admitting: Internal Medicine

## 2016-02-25 DIAGNOSIS — E1121 Type 2 diabetes mellitus with diabetic nephropathy: Secondary | ICD-10-CM

## 2016-02-25 DIAGNOSIS — I5032 Chronic diastolic (congestive) heart failure: Secondary | ICD-10-CM | POA: Diagnosis not present

## 2016-02-25 DIAGNOSIS — I151 Hypertension secondary to other renal disorders: Secondary | ICD-10-CM

## 2016-02-25 DIAGNOSIS — I2510 Atherosclerotic heart disease of native coronary artery without angina pectoris: Secondary | ICD-10-CM | POA: Diagnosis not present

## 2016-02-25 DIAGNOSIS — R635 Abnormal weight gain: Secondary | ICD-10-CM

## 2016-02-25 DIAGNOSIS — Z940 Kidney transplant status: Secondary | ICD-10-CM

## 2016-02-25 DIAGNOSIS — G4700 Insomnia, unspecified: Secondary | ICD-10-CM | POA: Insufficient documentation

## 2016-02-25 DIAGNOSIS — Z794 Long term (current) use of insulin: Secondary | ICD-10-CM

## 2016-02-25 DIAGNOSIS — N186 End stage renal disease: Secondary | ICD-10-CM

## 2016-02-25 MED ORDER — OXYCODONE-ACETAMINOPHEN 5-325 MG PO TABS: 0.5000 | ORAL_TABLET | Freq: Four times a day (QID) | ORAL | 0 refills | Status: DC | PRN

## 2016-02-25 MED ORDER — DOXEPIN HCL 3 MG PO TABS: 3.0000 mg | ORAL_TABLET | Freq: Every evening | ORAL | 5 refills | Status: DC | PRN

## 2016-02-25 NOTE — Assessment & Plan Note (Addendum)
Humalog SS Labs w/Nephrology

## 2016-02-25 NOTE — Assessment & Plan Note (Signed)
Try Silenor low dose Not to take w/Percocet

## 2016-02-25 NOTE — Progress Notes (Signed)
Pre visit review using our clinic review tool, if applicable. No additional management support is needed unless otherwise documented below in the visit note. 

## 2016-02-25 NOTE — Assessment & Plan Note (Signed)
Wt Readings from Last 3 Encounters:  02/25/16 175 lb (79.4 kg)  11/21/15 179 lb (81.2 kg)  11/10/15 180 lb (81.6 kg)

## 2016-02-25 NOTE — Progress Notes (Signed)
Subjective:  [name], female    DOB: April 16, 1946  Age: 70 y.o. MRN: [id number]  CC: No chief complaint on file.   HPI [name] presents for HTN, leg cramps, CRF, DM f/u. C/o insomnia  Outpatient Medications Prior to Visit  Medication Sig Dispense Refill  . Alum & Mag Hydroxide-Simeth (MAGIC MOUTHWASH) SOLN Take 5 mLs by mouth 4 (four) times daily. Swish, hold and swallow 500 mL 1  . amLODipine (NORVASC) 5 MG tablet Take 0.5 tablets (2.5 mg total) by mouth daily. 30 tablet 6  . amoxicillin (AMOXIL) 500 MG tablet Take all four tablets one hour prior to procedure prn 4 tablet 6  . dapsone 25 MG tablet Take 25 mg by mouth daily.     . diphenhydrAMINE (BENADRYL) 50 MG capsule Take 1 capsule (50 mg total) by mouth every 6 (six) hours as needed for itching. 90 capsule 3  . gabapentin (NEURONTIN) 100 MG capsule Take 1 capsule (100 mg total) by mouth 3 (three) times daily. 90 capsule 5  . insulin lispro (HUMALOG) 100 UNIT/ML KiwkPen If sugar is 140-200 - use 8 units If sugar is  201-230- use 10 units If sugar is  231-260 - use 12 units If sugar is  261- 300- use 14 units If sugar is >301 - use 16 units 15 mL 11  . Insulin Pen Needle 31G X 5 MM MISC Test up to TID dx:250.00 100 each 11  . levothyroxine (LEVOTHROID) 25 MCG tablet Take 2 tablets (50 mcg total) by mouth daily. 60 tablet 11  . metoprolol tartrate (LOPRESSOR) 25 MG tablet TAKE 1 TABLET BY MOUTH TWICE A DAY 60 tablet 10  . mycophenolate (MYFORTIC) 180 MG EC tablet Take 360 mg by mouth 2 (two) times daily.     [name] VERIO test strip USE AS INSTRUCTED 4 TIMES A DAY (BEFORE MEALS) & AT BEDTIME (ICD 10-E11.9) 150 each 4  . oxyCODONE-acetaminophen (ROXICET) 5-325 MG tablet Take 0.5-1 tablets by mouth every 6 (six) hours as needed for severe pain. 100 tablet 0  . tacrolimus (PROGRAF) 1 MG capsule Take 4 mg by mouth 2 (two) times daily.     Marland Kitchen warfarin (COUMADIN) 5 MG tablet Take as directed by coumadin clinic  Dispense 40 tablets 1 month supply disregard 30 tablets 120 tablet 1  . gentamicin (GARAMYCIN) 0.3 % ophthalmic solution Place 1 drop into both eyes every 4 (four) hours. (Patient not taking: Reported on 02/25/2016) 5 mL 0   No facility-administered medications prior to visit.     ROS Review of Systems  Constitutional: Positive for fatigue. Negative for activity change, appetite change, chills and unexpected weight change.  HENT: Negative for congestion, mouth sores and sinus pressure.   Eyes: Negative for visual disturbance.  Respiratory: Positive for shortness of breath. Negative for cough and chest tightness.   Cardiovascular: Positive for leg swelling. Negative for palpitations.  Gastrointestinal: Negative for abdominal pain and nausea.  Genitourinary: Negative for difficulty urinating, frequency and vaginal pain.  Musculoskeletal: Positive for arthralgias, back pain and gait problem.  Skin: Negative for pallor and rash.  Neurological: Positive for dizziness and weakness. Negative for tremors, numbness and headaches.  Psychiatric/Behavioral: Positive for decreased concentration. Negative for confusion and sleep disturbance.    Objective:  BP 130/60   Pulse 68   Wt 175 lb (79.4 kg)   SpO2 95%   BMI 32.01 kg/m   BP Readings from Last 3 Encounters:  02/25/16 130/60  12/02/15 173/76  11/21/15 140/60    Wt Readings from Last 3 Encounters:  02/25/16 175 lb (79.4 kg)  11/21/15 179 lb (81.2 kg)  11/10/15 180 lb (81.6 kg)    Physical Exam  Constitutional: She appears well-developed. No distress.  HENT:  Head: Normocephalic.  Right Ear: External ear normal.  Left Ear: External ear normal.  Nose: Nose normal.  Mouth/Throat: Oropharynx is clear and moist.  Eyes: Conjunctivae are normal. Pupils are equal, round, and reactive to light. Right eye exhibits no discharge. Left eye exhibits no discharge.  Neck: Normal range of motion. Neck supple. No JVD present. No tracheal  deviation present. No thyromegaly present.  Cardiovascular: Normal rate, regular rhythm and normal heart sounds.   Pulmonary/Chest: No stridor. No respiratory distress. She has no wheezes.  Abdominal: Soft. Bowel sounds are normal. She exhibits no distension and no mass. There is no tenderness. There is no rebound and no guarding.  Musculoskeletal: She exhibits no edema or tenderness.  Lymphadenopathy:    She has no cervical adenopathy.  Neurological: She displays normal reflexes. No cranial nerve deficit. She exhibits normal muscle tone. Coordination abnormal.  Skin: No rash noted. No erythema.  Psychiatric: She has a normal mood and affect. Her behavior is normal. Judgment and thought content normal.  pacemaker Cane  Lab Results  Component Value Date   WBC 6.4 01/09/2014   HGB 11.8 (L) 01/09/2014   HCT 37.1 01/09/2014   PLT 213.0 01/09/2014   GLUCOSE 200 (H) 04/12/2014   CHOL 164 04/06/2013   TRIG 125 04/06/2013   HDL 73 04/06/2013   LDLDIRECT 89.3 06/13/2006   LDLCALC 66 04/06/2013   ALT 24 01/09/2014   AST 23 01/09/2014   NA 138 04/12/2014   K 3.7 04/12/2014   CL 103 04/12/2014   CREATININE 1.6 (H) 04/12/2014   BUN 27 (H) 04/12/2014   CO2 30 04/12/2014   TSH 5.47 (H) 01/09/2014   INR 2.4 02/24/2016   HGBA1C 7.9 (H) 04/12/2014   MICROALBUR 168.6 ([place]) 10/31/2006    No results found.  Assessment & Plan:   There are no diagnoses linked to this encounter. I am having [name] maintain her mycophenolate, tacrolimus, dapsone, diphenhydrAMINE, Insulin Pen Needle, magic mouthwash, amLODipine, gabapentin, levothyroxine, metoprolol tartrate, insulin lispro, amoxicillin, ONETOUCH VERIO, warfarin, oxyCODONE-acetaminophen, gentamicin, magnesium oxide, and losartan.  Meds ordered this encounter  Medications  . magnesium oxide (MAG-OX) 400 MG tablet    Sig: Take 1 tablet by mouth daily.    Refill:  3  . losartan (COZAAR) 25 MG tablet    Sig: Take 12.5 mg by mouth at bedtime.     Refill:  12     Follow-up: No Follow-up on file.  Walker Kehr, MD

## 2016-02-25 NOTE — Assessment & Plan Note (Signed)
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 

## 2016-02-25 NOTE — Assessment & Plan Note (Signed)
Metoprolol, Furosemide 

## 2016-02-25 NOTE — Assessment & Plan Note (Signed)
Amlodipine, Metoprolol, Furosemide 

## 2016-03-17 DIAGNOSIS — Z882 Allergy status to sulfonamides status: Secondary | ICD-10-CM | POA: Diagnosis not present

## 2016-03-17 DIAGNOSIS — Z79899 Other long term (current) drug therapy: Secondary | ICD-10-CM | POA: Diagnosis not present

## 2016-03-17 DIAGNOSIS — D899 Disorder involving the immune mechanism, unspecified: Secondary | ICD-10-CM | POA: Diagnosis not present

## 2016-03-17 DIAGNOSIS — Z886 Allergy status to analgesic agent status: Secondary | ICD-10-CM | POA: Diagnosis not present

## 2016-03-17 DIAGNOSIS — E0822 Diabetes mellitus due to underlying condition with diabetic chronic kidney disease: Secondary | ICD-10-CM | POA: Diagnosis not present

## 2016-03-17 DIAGNOSIS — N183 Chronic kidney disease, stage 3 (moderate): Secondary | ICD-10-CM | POA: Diagnosis not present

## 2016-03-17 DIAGNOSIS — Z794 Long term (current) use of insulin: Secondary | ICD-10-CM | POA: Diagnosis not present

## 2016-03-17 DIAGNOSIS — I120 Hypertensive chronic kidney disease with stage 5 chronic kidney disease or end stage renal disease: Secondary | ICD-10-CM | POA: Diagnosis not present

## 2016-03-17 DIAGNOSIS — Z4822 Encounter for aftercare following kidney transplant: Secondary | ICD-10-CM | POA: Diagnosis not present

## 2016-03-17 DIAGNOSIS — Z940 Kidney transplant status: Secondary | ICD-10-CM | POA: Diagnosis not present

## 2016-03-17 DIAGNOSIS — N186 End stage renal disease: Secondary | ICD-10-CM | POA: Diagnosis not present

## 2016-03-17 DIAGNOSIS — Z952 Presence of prosthetic heart valve: Secondary | ICD-10-CM | POA: Diagnosis not present

## 2016-03-17 DIAGNOSIS — E1122 Type 2 diabetes mellitus with diabetic chronic kidney disease: Secondary | ICD-10-CM | POA: Diagnosis not present

## 2016-03-18 DIAGNOSIS — Z952 Presence of prosthetic heart valve: Secondary | ICD-10-CM | POA: Diagnosis not present

## 2016-03-18 DIAGNOSIS — Z4822 Encounter for aftercare following kidney transplant: Secondary | ICD-10-CM | POA: Diagnosis not present

## 2016-03-18 DIAGNOSIS — E1122 Type 2 diabetes mellitus with diabetic chronic kidney disease: Secondary | ICD-10-CM | POA: Diagnosis not present

## 2016-03-18 DIAGNOSIS — Z794 Long term (current) use of insulin: Secondary | ICD-10-CM | POA: Diagnosis not present

## 2016-03-18 DIAGNOSIS — I120 Hypertensive chronic kidney disease with stage 5 chronic kidney disease or end stage renal disease: Secondary | ICD-10-CM | POA: Diagnosis not present

## 2016-03-18 DIAGNOSIS — N186 End stage renal disease: Secondary | ICD-10-CM | POA: Diagnosis not present

## 2016-04-06 ENCOUNTER — Ambulatory Visit (INDEPENDENT_AMBULATORY_CARE_PROVIDER_SITE_OTHER): Payer: Medicare Other | Admitting: Pharmacist

## 2016-04-06 DIAGNOSIS — Z5181 Encounter for therapeutic drug level monitoring: Secondary | ICD-10-CM | POA: Diagnosis not present

## 2016-04-06 DIAGNOSIS — I4891 Unspecified atrial fibrillation: Secondary | ICD-10-CM | POA: Diagnosis not present

## 2016-04-06 LAB — POCT INR: INR: 1.9

## 2016-04-12 ENCOUNTER — Other Ambulatory Visit: Payer: Self-pay | Admitting: Internal Medicine

## 2016-04-12 DIAGNOSIS — Z1231 Encounter for screening mammogram for malignant neoplasm of breast: Secondary | ICD-10-CM

## 2016-04-16 DIAGNOSIS — Z79899 Other long term (current) drug therapy: Secondary | ICD-10-CM | POA: Diagnosis not present

## 2016-04-16 DIAGNOSIS — Z940 Kidney transplant status: Secondary | ICD-10-CM | POA: Diagnosis not present

## 2016-04-16 DIAGNOSIS — N189 Chronic kidney disease, unspecified: Secondary | ICD-10-CM | POA: Diagnosis not present

## 2016-04-20 DIAGNOSIS — E119 Type 2 diabetes mellitus without complications: Secondary | ICD-10-CM | POA: Diagnosis not present

## 2016-04-20 DIAGNOSIS — I10 Essential (primary) hypertension: Secondary | ICD-10-CM | POA: Diagnosis not present

## 2016-04-20 DIAGNOSIS — Z940 Kidney transplant status: Secondary | ICD-10-CM | POA: Diagnosis not present

## 2016-04-20 DIAGNOSIS — D899 Disorder involving the immune mechanism, unspecified: Secondary | ICD-10-CM | POA: Diagnosis not present

## 2016-04-20 DIAGNOSIS — R809 Proteinuria, unspecified: Secondary | ICD-10-CM | POA: Diagnosis not present

## 2016-04-26 ENCOUNTER — Other Ambulatory Visit: Payer: Self-pay | Admitting: Internal Medicine

## 2016-04-27 ENCOUNTER — Ambulatory Visit
Admission: RE | Admit: 2016-04-27 | Discharge: 2016-04-27 | Disposition: A | Discharge status: Home / Self Care | Payer: Medicare Other | Source: Ambulatory Visit | Attending: Internal Medicine | Admitting: Internal Medicine

## 2016-04-27 DIAGNOSIS — Z1231 Encounter for screening mammogram for malignant neoplasm of breast: Secondary | ICD-10-CM | POA: Diagnosis not present

## 2016-05-03 ENCOUNTER — Ambulatory Visit (INDEPENDENT_AMBULATORY_CARE_PROVIDER_SITE_OTHER): Payer: Medicare Other | Admitting: Pharmacist

## 2016-05-03 DIAGNOSIS — Z5181 Encounter for therapeutic drug level monitoring: Secondary | ICD-10-CM | POA: Diagnosis not present

## 2016-05-03 DIAGNOSIS — I4891 Unspecified atrial fibrillation: Secondary | ICD-10-CM

## 2016-05-03 LAB — POCT INR: INR: 1.6

## 2016-05-04 ENCOUNTER — Other Ambulatory Visit: Payer: Self-pay | Admitting: Internal Medicine

## 2016-05-04 DIAGNOSIS — H2513 Age-related nuclear cataract, bilateral: Secondary | ICD-10-CM | POA: Diagnosis not present

## 2016-05-04 DIAGNOSIS — H35033 Hypertensive retinopathy, bilateral: Secondary | ICD-10-CM | POA: Diagnosis not present

## 2016-05-04 DIAGNOSIS — E119 Type 2 diabetes mellitus without complications: Secondary | ICD-10-CM | POA: Diagnosis not present

## 2016-05-04 DIAGNOSIS — H11153 Pinguecula, bilateral: Secondary | ICD-10-CM | POA: Diagnosis not present

## 2016-05-04 LAB — HM DIABETES EYE EXAM

## 2016-05-06 ENCOUNTER — Encounter: Payer: Self-pay | Admitting: Internal Medicine

## 2016-05-11 ENCOUNTER — Inpatient Hospital Stay (HOSPITAL_COMMUNITY)
Admission: RE | Admit: 2016-05-11 | Discharge: 2016-05-11 | Disposition: A | Discharge status: Home / Self Care | Payer: Medicare Other | Source: Ambulatory Visit | Attending: Nurse Practitioner | Admitting: Nurse Practitioner

## 2016-05-20 ENCOUNTER — Ambulatory Visit (HOSPITAL_COMMUNITY)
Admission: RE | Admit: 2016-05-20 | Discharge: 2016-05-20 | Disposition: A | Discharge status: Home / Self Care | Payer: Medicare Other | Source: Ambulatory Visit | Attending: Nurse Practitioner | Admitting: Nurse Practitioner

## 2016-05-20 ENCOUNTER — Encounter (HOSPITAL_COMMUNITY): Payer: Self-pay | Admitting: Nurse Practitioner

## 2016-05-20 VITALS — BP 144/80 | HR 69 | Ht 62.0 in | Wt 172.2 lb

## 2016-05-20 DIAGNOSIS — I517 Cardiomegaly: Secondary | ICD-10-CM | POA: Insufficient documentation

## 2016-05-20 DIAGNOSIS — I10 Essential (primary) hypertension: Secondary | ICD-10-CM | POA: Diagnosis not present

## 2016-05-20 DIAGNOSIS — I4891 Unspecified atrial fibrillation: Secondary | ICD-10-CM | POA: Diagnosis not present

## 2016-05-20 DIAGNOSIS — Z79899 Other long term (current) drug therapy: Secondary | ICD-10-CM | POA: Diagnosis not present

## 2016-05-20 DIAGNOSIS — Z794 Long term (current) use of insulin: Secondary | ICD-10-CM | POA: Insufficient documentation

## 2016-05-20 DIAGNOSIS — Z87891 Personal history of nicotine dependence: Secondary | ICD-10-CM | POA: Diagnosis not present

## 2016-05-20 DIAGNOSIS — Z7901 Long term (current) use of anticoagulants: Secondary | ICD-10-CM | POA: Diagnosis not present

## 2016-05-20 DIAGNOSIS — Z953 Presence of xenogenic heart valve: Secondary | ICD-10-CM | POA: Diagnosis not present

## 2016-05-20 NOTE — Progress Notes (Signed)
[name] [name], female   DOB: 20-Apr-1946, 70 y.o.   MRN: [id number]     Primary Care Physician: Walker Kehr, MD Referring Physician: Dr. [name] Hagberg is a 70 y.o. female with a h/o  status post aortic valve replacement using a bioprosthetic tissue valve with maze procedure on 04/12/2013.  Since then she has remained clinically stable. She describes stable symptoms of exertional shortness of breath that occur only when she is doing moderate activity. She denies any palpitations or other symptoms to suggest a recurrence of atrial fibrillation. She continues on warfarin. She is also being followed for a stable lung nodule by Dr. Roxy Manns.  F/u in afib clinic for long term f/u of maze procedure . She is here for one year f/u. She continues to do well without any evidence of afib. She continues on coumadin for CVA in 2009. She is raising a 7 month old great grandson and this causes her some physical/mental stress.She walks with a cane but no falls.   Today, she denies symptoms of palpitations, chest pain, shortness of breath, orthopnea, PND, lower extremity edema, dizziness, presyncope, syncope, or neurologic sequela. The patient is tolerating medications without difficulties and is otherwise without complaint today.   Past Medical History:  Diagnosis Date  . Anemia, iron deficiency    of chronic disease  . Aortic stenosis    a. Severe AS by echo 11/2012.  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 Asystole ([place])    a. During ENT surgery 2005: developed marked asystole requiring CPR, felt due to vagal reaction (cath nonobst dz).  . Carotid artery disease ([place])    a. Carotid Dopplers performed in August 2013 showed 40-59% left stenosis and 0-39% right; f/u recommended in 2 years.   . Cerebrovascular accident [facility]) 2009   a. LMCA infarct felt embolic 6967, maintained on chronic coumadin.; denies residual on 04/05/2013  . CHF (congestive heart failure)  ([place])   . Cholelithiasis   . Chronic cough onset 03/2010   Dr Melvyn Novas  . Chronic Persistent Atrial Fibrillation 12/31/2008   Qualifier: Diagnosis of  By: [name]    . Coronary artery disease 05/2002   a. Ant MI 2003 s/p PTCA/stent to RCA.   . Diverticulosis of colon   . Esophagitis, reflux   . ESRD (end stage renal disease) ([place])    a. Mass on L kidney per pt s/p nephrectomy - pt states not cancer - [facility] notes indicate ESRD due to HTN/DM - was previously on HD. b. Kidney transplant 02/2011.  Marland Kitchen GERD (gastroesophageal reflux disease)   . Gout   . Helicobacter pylori (H. pylori) infection    hx of  . Hemorrhoids   . Hx of colonic polyps    adenomatous  . Hyperlipidemia   . Hypertension   . Lung nodule seen on imaging study 04/07/2013   1.0 cm ground glass opacity RUL  . Myocardial infarction 2003  . Pericardial effusion    a. Small by echo 11/2011.  Marland Kitchen Pruritic dermatitis    treated with steriods/UV light  . S/P aortic valve replacement with bioprosthetic valve and maze procedure 04/12/2013   [facility]   . S/P Maze operation for atrial fibrillation 04/12/2013   Complete bilateral atrial lesion set using bipolar radiofrequency and cryothermy ablation with clipping of LA appendage  . Sleep apnea    Pt says testing was positive, intolerant of  CPAP.  . 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  . Type II diabetes mellitus ([place])   . Unspecified hearing loss    Past Surgical History:  Procedure Laterality Date  . AORTIC VALVE REPLACEMENT N/A 04/12/2013   Procedure: AORTIC VALVE REPLACEMENT (AVR);  Surgeon: Rexene Alberts, MD;  Location: [place];  Service: Open Heart Surgery;  Laterality: N/A;  . ARTERIOVENOUS GRAFT PLACEMENT Left   . ARTERIOVENOUS GRAFT PLACEMENT Left    "I've had 2 on  my left; had one removed" (04/05/2013)   . ARTERY EXPLORATION Right 04/11/2013   Procedure: ARTERY EXPLORATION;  Surgeon: Rexene Alberts, MD;  Location: [place];  Service: Open Heart Surgery;  Laterality: Right;  Right carotid artery exploration  . AV FISTULA PLACEMENT Right   . AV FISTULA REPAIR Right    "took it out" ((/18/2014)  . CARDIOVERSION  05/29/2012   Procedure: CARDIOVERSION;  Surgeon: Lelon Perla, MD;  Location: [facility] ENDOSCOPY;  Service: Cardiovascular;  Laterality: N/A;  . CHOLECYSTECTOMY  2009   with hernia removal  . CORONARY ANGIOPLASTY WITH STENT PLACEMENT Right    coronary artery  . INSERTION OF DIALYSIS CATHETER Bilateral    "over the years; took them both out" (04/05/2013)  . INTRAOPERATIVE TRANSESOPHAGEAL ECHOCARDIOGRAM N/A 04/11/2013   Procedure: INTRAOPERATIVE TRANSESOPHAGEAL ECHOCARDIOGRAM;  Surgeon: Rexene Alberts, MD;  Location: [place];  Service: Open Heart Surgery;  Laterality: N/A;  . INTRAOPERATIVE TRANSESOPHAGEAL ECHOCARDIOGRAM N/A 04/12/2013   Procedure: INTRAOPERATIVE TRANSESOPHAGEAL ECHOCARDIOGRAM;  Surgeon: Rexene Alberts, MD;  Location: [place];  Service: Open Heart Surgery;  Laterality: N/A;  . KIDNEY TRANSPLANT  03/16/11  . LEFT AND RIGHT HEART CATHETERIZATION WITH CORONARY ANGIOGRAM N/A 04/06/2013   Procedure: LEFT AND RIGHT HEART CATHETERIZATION WITH CORONARY ANGIOGRAM;  Surgeon: Blane Ohara, MD;  Location: [facility] CATH LAB;  Service: Cardiovascular;  Laterality: N/A;  . MAZE N/A 04/12/2013   Procedure: MAZE;  Surgeon: Rexene Alberts, MD;  Location: [place];  Service: Open Heart Surgery;  Laterality: N/A;  . NASAL RECONSTRUCTION WITH SEPTAL REPAIR     "took it out" (04/05/2013)  . NEPHRECTOMY Left 2010   no CA on bx  . TONSILLECTOMY    . TOTAL ABDOMINAL HYSTERECTOMY    . TUBAL LIGATION      Current Outpatient Prescriptions  Medication Sig Dispense Refill  . Alum & Mag Hydroxide-Simeth (MAGIC MOUTHWASH) SOLN Take 5 mLs by mouth 4 (four) times daily.  Swish, hold and swallow 500 mL 1  . amLODipine (NORVASC) 5 MG tablet Take 0.5 tablets (2.5 mg total) by mouth daily. 30 tablet 6  . amoxicillin (AMOXIL) 500 MG tablet Take all four tablets one hour prior to procedure prn 4 tablet 6  . dapsone 25 MG tablet Take 25 mg by mouth daily.     . diphenhydrAMINE (BENADRYL) 50 MG capsule Take 1 capsule (50 mg total) by mouth every 6 (six) hours as needed for itching. 90 capsule 3  . gabapentin (NEURONTIN) 100 MG capsule TAKE 1 CAPSULE BY MOUTH 3 TIMES DAILY. 90 capsule 3  . insulin lispro (HUMALOG) 100 UNIT/ML KiwkPen If sugar is 140-200 - use 8 units If sugar is  201-230- use 10 units If sugar is  231-260 - use 12 units If sugar is  261- 300- use 14 units If sugar is >301 - use 16 units 15  mL 11  . Insulin Pen Needle 31G X 5 MM MISC Test up to TID dx:250.00 100 each 11  . levothyroxine (SYNTHROID, LEVOTHROID) 25 MCG tablet TAKE 2 TABLETS (50 MCG TOTAL) BY MOUTH DAILY. 60 tablet 11  . losartan (COZAAR) 25 MG tablet Take 12.5 mg by mouth at bedtime.  12  . metoprolol tartrate (LOPRESSOR) 25 MG tablet TAKE 1 TABLET BY MOUTH TWICE A DAY 60 tablet 10  . mycophenolate (MYFORTIC) 180 MG EC tablet Take 360 mg by mouth 2 (two) times daily.     [name] VERIO test strip USE AS INSTRUCTED 4 TIMES A DAY (BEFORE MEALS) & AT BEDTIME (ICD 10-E11.9) 150 each 4  . oxyCODONE-acetaminophen (ROXICET) 5-325 MG tablet Take 0.5-1 tablets by mouth every 6 (six) hours as needed for severe pain. 100 tablet 0  . tacrolimus (PROGRAF) 1 MG capsule Take 4 mg by mouth 2 (two) times daily.     Marland Kitchen warfarin (COUMADIN) 5 MG tablet Take as directed by coumadin clinic Dispense 40 tablets 1 month supply disregard 30 tablets 120 tablet 1  . Doxepin HCl (SILENOR) 3 MG TABS Take 1 tablet (3 mg total) by mouth at bedtime as needed. (Patient not taking: Reported on 05/20/2016) 30 tablet 5   No current facility-administered medications for this encounter.     Allergies  Allergen Reactions  .  Ibuprofen Nausea And Vomiting  . Sulfamethoxazole-Trimethoprim Swelling  . Sulfonamide Derivatives Swelling  . Tape Rash  . Tramadol Nausea And Vomiting  . Bactrim Swelling  . Red Dye Itching and Rash    Social History   Social History  . Marital status: Married    Spouse name: N/A  . Number of children: 5  . Years of education: N/A   Occupational History  . retired Retired   Social History Main Topics  . Smoking status: Former Smoker    Packs/day: 1.00    Years: 30.00    Types: Cigarettes    Quit date: 07/19/2001  . Smokeless tobacco: Never Used  . Alcohol use No  . Drug use: No  . Sexual activity: Not Currently   Other Topics Concern  . Not on file   Social History Narrative   Patient signed a Designated Party Release to allow her spouse [name] and family and five children to have access to her medical records/information.      Family History  Problem Relation Age of Onset  . Stroke Father   . Diabetes Other   . Hypertension Mother     ROS- All systems are reviewed and negative except as per the HPI above  Physical Exam: Vitals:   05/20/16 1404  BP: (!) 144/80  Pulse: 69  Weight: 172 lb 3.2 oz (78.1 kg)  Height: '5\' 2"'$  (1.575 m)    GEN- The patient is well appearing, alert and oriented x 3 today.   Head- normocephalic, atraumatic Eyes-  Sclera clear, conjunctiva pink Ears- hearing intact Oropharynx- clear Neck- supple, no JVP Lymph- no cervical lymphadenopathy Lungs- Clear to ausculation bilaterally, normal work of breathing Heart- Regular rate and rhythm, no murmurs, rubs or gallops, PMI not laterally displaced GI- soft, NT, ND, + BS Extremities- no clubbing, cyanosis, or edema MS- no significant deformity or atrophy Skin- no rash or lesion Psych- euthymic mood, full affect Neuro- strength and sensation are intact  EKG-SR at 69 bpm, pr int 168 ms, qrs int 441 ms, qtc 441 ms Epic records reviewed  Assessment and Plan: 1. Afib s/p  maze  2014 Doing well long term without any evidence of afib Continues on warfain for previous CVA  Continue metoprolol  2. S/P aortic valve repalcement with bioprosthetic valve Stable   3. HTN  Stable Avoid salt  F/u in afib clinc in one year for long term monitoring of afib with maze procedure   [name] C. Jonica Bickhart, [facility] [address] [place], [place] [address] [phone]

## 2016-05-24 ENCOUNTER — Ambulatory Visit (INDEPENDENT_AMBULATORY_CARE_PROVIDER_SITE_OTHER): Payer: Medicare Other | Admitting: Pharmacist Clinician (PhC)/ Clinical Pharmacy Specialist

## 2016-05-24 DIAGNOSIS — Z5181 Encounter for therapeutic drug level monitoring: Secondary | ICD-10-CM

## 2016-05-24 DIAGNOSIS — I4891 Unspecified atrial fibrillation: Secondary | ICD-10-CM | POA: Diagnosis not present

## 2016-05-24 LAB — POCT INR: INR: 1.4

## 2016-05-25 ENCOUNTER — Other Ambulatory Visit: Payer: Self-pay | Admitting: Cardiology

## 2016-05-25 NOTE — Telephone Encounter (Signed)
Rx(s) sent to pharmacy electronically.  

## 2016-05-26 ENCOUNTER — Other Ambulatory Visit: Payer: Self-pay | Admitting: Internal Medicine

## 2016-05-28 ENCOUNTER — Other Ambulatory Visit: Payer: Self-pay | Admitting: Cardiology

## 2016-05-31 DIAGNOSIS — H16142 Punctate keratitis, left eye: Secondary | ICD-10-CM | POA: Diagnosis not present

## 2016-05-31 DIAGNOSIS — H532 Diplopia: Secondary | ICD-10-CM | POA: Diagnosis not present

## 2016-05-31 DIAGNOSIS — H04123 Dry eye syndrome of bilateral lacrimal glands: Secondary | ICD-10-CM | POA: Diagnosis not present

## 2016-05-31 DIAGNOSIS — H1132 Conjunctival hemorrhage, left eye: Secondary | ICD-10-CM | POA: Diagnosis not present

## 2016-06-04 ENCOUNTER — Ambulatory Visit (INDEPENDENT_AMBULATORY_CARE_PROVIDER_SITE_OTHER): Payer: Medicare Other | Admitting: Pharmacist

## 2016-06-04 DIAGNOSIS — I4891 Unspecified atrial fibrillation: Secondary | ICD-10-CM | POA: Diagnosis not present

## 2016-06-04 DIAGNOSIS — Z5181 Encounter for therapeutic drug level monitoring: Secondary | ICD-10-CM | POA: Diagnosis not present

## 2016-06-04 DIAGNOSIS — I2510 Atherosclerotic heart disease of native coronary artery without angina pectoris: Secondary | ICD-10-CM

## 2016-06-04 LAB — POCT INR: INR: 2.2

## 2016-06-09 ENCOUNTER — Other Ambulatory Visit: Payer: Self-pay | Admitting: Internal Medicine

## 2016-06-25 ENCOUNTER — Ambulatory Visit (INDEPENDENT_AMBULATORY_CARE_PROVIDER_SITE_OTHER): Payer: Medicare Other | Admitting: Pharmacist

## 2016-06-25 DIAGNOSIS — I4891 Unspecified atrial fibrillation: Secondary | ICD-10-CM

## 2016-06-25 DIAGNOSIS — Z5181 Encounter for therapeutic drug level monitoring: Secondary | ICD-10-CM | POA: Diagnosis not present

## 2016-06-25 DIAGNOSIS — I2510 Atherosclerotic heart disease of native coronary artery without angina pectoris: Secondary | ICD-10-CM

## 2016-06-25 LAB — POCT INR: INR: 2.6

## 2016-06-28 ENCOUNTER — Ambulatory Visit (INDEPENDENT_AMBULATORY_CARE_PROVIDER_SITE_OTHER): Payer: Medicare Other | Admitting: Internal Medicine

## 2016-06-28 ENCOUNTER — Encounter: Payer: Self-pay | Admitting: Internal Medicine

## 2016-06-28 DIAGNOSIS — N186 End stage renal disease: Secondary | ICD-10-CM | POA: Diagnosis not present

## 2016-06-28 DIAGNOSIS — I5032 Chronic diastolic (congestive) heart failure: Secondary | ICD-10-CM | POA: Diagnosis not present

## 2016-06-28 DIAGNOSIS — I2510 Atherosclerotic heart disease of native coronary artery without angina pectoris: Secondary | ICD-10-CM

## 2016-06-28 DIAGNOSIS — M1000 Idiopathic gout, unspecified site: Secondary | ICD-10-CM

## 2016-06-28 DIAGNOSIS — M5440 Lumbago with sciatica, unspecified side: Secondary | ICD-10-CM

## 2016-06-28 DIAGNOSIS — E1121 Type 2 diabetes mellitus with diabetic nephropathy: Secondary | ICD-10-CM

## 2016-06-28 DIAGNOSIS — Z952 Presence of prosthetic heart valve: Secondary | ICD-10-CM | POA: Diagnosis not present

## 2016-06-28 DIAGNOSIS — Z794 Long term (current) use of insulin: Secondary | ICD-10-CM

## 2016-06-28 DIAGNOSIS — Z23 Encounter for immunization: Secondary | ICD-10-CM | POA: Diagnosis not present

## 2016-06-28 DIAGNOSIS — G8929 Other chronic pain: Secondary | ICD-10-CM

## 2016-06-28 NOTE — Progress Notes (Signed)
Pre visit review using our clinic review tool, if applicable. No additional management support is needed unless otherwise documented below in the visit note. 

## 2016-06-28 NOTE — Assessment & Plan Note (Signed)
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 

## 2016-06-28 NOTE — Addendum Note (Signed)
Addended by: Cresenciano Lick on: 06/28/2016 04:26 PM   Modules accepted: Orders

## 2016-06-28 NOTE — Assessment & Plan Note (Signed)
Doing well 

## 2016-06-28 NOTE — Progress Notes (Signed)
Subjective:  [name], female    DOB: 10-Aug-1945  Age: 70 y.o. MRN: [id number]  CC: No chief complaint on file.   HPI Jaedah A Vanbuskirk presents for HTN, CRF, OA f/u  Outpatient Medications Prior to Visit  Medication Sig Dispense Refill  . Alum & Mag Hydroxide-Simeth (MAGIC MOUTHWASH) SOLN Take 5 mLs by mouth 4 (four) times daily. Swish, hold and swallow 500 mL 1  . amLODipine (NORVASC) 5 MG tablet Take 0.5 tablets (2.5 mg total) by mouth daily. 30 tablet 6  . amoxicillin (AMOXIL) 500 MG capsule TAKE ALL 4 CAPSULES PRIOR TO PROCEDURE AS NEEDED 4 capsule 1  . dapsone 25 MG tablet Take 25 mg by mouth daily.     . diphenhydrAMINE (BENADRYL) 50 MG capsule Take 1 capsule (50 mg total) by mouth every 6 (six) hours as needed for itching. 90 capsule 3  . Doxepin HCl (SILENOR) 3 MG TABS Take 1 tablet (3 mg total) by mouth at bedtime as needed. 30 tablet 5  . gabapentin (NEURONTIN) 100 MG capsule TAKE 1 CAPSULE BY MOUTH 3 TIMES DAILY. 90 capsule 3  . HUMALOG KWIKPEN 100 UNIT/ML KiwkPen IF SUGAR IS 140-200 USE 8 UNITS, 201-230 USE 10 UNITS, 231-260 USE 12 UNITS, 261-300 USE 14 UNITS, IF GREATER THAN 301 USE 16 UNITS 15 mL 2  . Insulin Pen Needle 31G X 5 MM MISC Test up to TID dx:250.00 100 each 11  . levothyroxine (SYNTHROID, LEVOTHROID) 25 MCG tablet TAKE 2 TABLETS (50 MCG TOTAL) BY MOUTH DAILY. 60 tablet 11  . losartan (COZAAR) 25 MG tablet Take 12.5 mg by mouth at bedtime.  12  . metoprolol tartrate (LOPRESSOR) 25 MG tablet TAKE 1 TABLET BY MOUTH TWICE A DAY 60 tablet 4  . mycophenolate (MYFORTIC) 180 MG EC tablet Take 360 mg by mouth 2 (two) times daily.     [name] VERIO test strip USE AS INSTRUCTED 4 TIMES A DAY (BEFORE MEALS) & AT BEDTIME (ICD 10-E11.9) 150 each 4  . tacrolimus (PROGRAF) 1 MG capsule Take 4 mg by mouth 2 (two) times daily.     Marland Kitchen warfarin (COUMADIN) 5 MG tablet TAKE AS DIRECTED BY COUMADIN CLINIC DISPENSE 40 TABLETS 1 MONTH SUPPLY DISREGARD 30 TABLETS 120  tablet 1  . amoxicillin (AMOXIL) 500 MG tablet Take all four tablets one hour prior to procedure prn 4 tablet 6  . oxyCODONE-acetaminophen (ROXICET) 5-325 MG tablet Take 0.5-1 tablets by mouth every 6 (six) hours as needed for severe pain. (Patient not taking: Reported on 06/28/2016) 100 tablet 0   No facility-administered medications prior to visit.     ROS Review of Systems  Constitutional: Positive for fatigue. Negative for activity change, appetite change, chills and unexpected weight change.  HENT: Negative for congestion, mouth sores and sinus pressure.   Eyes: Negative for visual disturbance.  Respiratory: Negative for cough and chest tightness.   Gastrointestinal: Negative for abdominal pain and nausea.  Genitourinary: Negative for difficulty urinating, frequency and vaginal pain.  Musculoskeletal: Positive for back pain and gait problem.  Skin: Negative for pallor and rash.  Neurological: Negative for dizziness, tremors, weakness, numbness and headaches.  Psychiatric/Behavioral: Negative for confusion and sleep disturbance.    Objective:  BP (!) 140/50   Pulse (!) 58   Wt 174 lb (78.9 kg)   SpO2 94%   BMI 31.83 kg/m   BP Readings from Last 3 Encounters:  06/28/16 (!) 140/50  05/20/16 (!) 144/80  02/25/16 130/60  Wt Readings from Last 3 Encounters:  06/28/16 174 lb (78.9 kg)  05/20/16 172 lb 3.2 oz (78.1 kg)  02/25/16 175 lb (79.4 kg)    Physical Exam  Constitutional: She appears well-developed. No distress.  HENT:  Head: Normocephalic.  Right Ear: External ear normal.  Left Ear: External ear normal.  Nose: Nose normal.  Mouth/Throat: Oropharynx is clear and moist.  Eyes: Conjunctivae are normal. Pupils are equal, round, and reactive to light. Right eye exhibits no discharge. Left eye exhibits no discharge.  Neck: Normal range of motion. Neck supple. No JVD present. No tracheal deviation present. No thyromegaly present.  Cardiovascular: Normal rate,  regular rhythm and normal heart sounds.   Pulmonary/Chest: No stridor. No respiratory distress. She has no wheezes.  Abdominal: Soft. Bowel sounds are normal. She exhibits no distension and no mass. There is no tenderness. There is no rebound and no guarding.  Musculoskeletal: She exhibits tenderness. She exhibits no edema.  Lymphadenopathy:    She has no cervical adenopathy.  Neurological: She displays normal reflexes. No cranial nerve deficit. She exhibits normal muscle tone. Coordination abnormal.  Skin: No rash noted. No erythema.  Psychiatric: She has a normal mood and affect. Her behavior is normal. Judgment and thought content normal.  cane  Lab Results  Component Value Date   WBC 6.4 01/09/2014   HGB 11.8 (L) 01/09/2014   HCT 37.1 01/09/2014   PLT 213.0 01/09/2014   GLUCOSE 200 (H) 04/12/2014   CHOL 164 04/06/2013   TRIG 125 04/06/2013   HDL 73 04/06/2013   LDLDIRECT 89.3 06/13/2006   LDLCALC 66 04/06/2013   ALT 24 01/09/2014   AST 23 01/09/2014   NA 138 04/12/2014   K 3.7 04/12/2014   CL 103 04/12/2014   CREATININE 1.6 (H) 04/12/2014   BUN 27 (H) 04/12/2014   CO2 30 04/12/2014   TSH 5.47 (H) 01/09/2014   INR 2.6 06/25/2016   HGBA1C 7.9 (H) 04/12/2014   MICROALBUR 168.6 ([place]) 10/31/2006    No results found.  Assessment & Plan:   There are no diagnoses linked to this encounter. I am having Ms. Colledge maintain her mycophenolate, tacrolimus, dapsone, diphenhydrAMINE, Insulin Pen Needle, magic mouthwash, amLODipine, ONETOUCH VERIO, losartan, oxyCODONE-acetaminophen, Doxepin HCl, levothyroxine, gabapentin, metoprolol tartrate, HUMALOG KWIKPEN, warfarin, and amoxicillin.  No orders of the defined types were placed in this encounter.    Follow-up: No Follow-up on file.  Walker Kehr, MD

## 2016-06-28 NOTE — Assessment & Plan Note (Addendum)
Furosemide, Metoprolol Labs w/Dr Joelyn Oms

## 2016-06-28 NOTE — Assessment & Plan Note (Signed)
Tylenol prn 

## 2016-06-28 NOTE — Assessment & Plan Note (Addendum)
Doing well Labs w/Dr Joelyn Oms

## 2016-06-28 NOTE — Assessment & Plan Note (Signed)
No relapse 

## 2016-06-29 ENCOUNTER — Encounter: Payer: Self-pay | Admitting: Endocrinology

## 2016-06-29 ENCOUNTER — Ambulatory Visit (INDEPENDENT_AMBULATORY_CARE_PROVIDER_SITE_OTHER): Payer: Medicare Other | Admitting: Endocrinology

## 2016-06-29 VITALS — BP 156/80 | HR 61 | Ht 62.0 in | Wt 175.0 lb

## 2016-06-29 DIAGNOSIS — I2510 Atherosclerotic heart disease of native coronary artery without angina pectoris: Secondary | ICD-10-CM | POA: Diagnosis not present

## 2016-06-29 DIAGNOSIS — E038 Other specified hypothyroidism: Secondary | ICD-10-CM

## 2016-06-29 DIAGNOSIS — R600 Localized edema: Secondary | ICD-10-CM

## 2016-06-29 DIAGNOSIS — Z794 Long term (current) use of insulin: Secondary | ICD-10-CM | POA: Diagnosis not present

## 2016-06-29 DIAGNOSIS — E1142 Type 2 diabetes mellitus with diabetic polyneuropathy: Secondary | ICD-10-CM

## 2016-06-29 DIAGNOSIS — E1165 Type 2 diabetes mellitus with hyperglycemia: Secondary | ICD-10-CM | POA: Diagnosis not present

## 2016-06-29 DIAGNOSIS — E063 Autoimmune thyroiditis: Secondary | ICD-10-CM

## 2016-06-29 LAB — POCT GLYCOSYLATED HEMOGLOBIN (HGB A1C): HEMOGLOBIN A1C: 9.3

## 2016-06-29 MED ORDER — INSULIN DEGLUDEC 100 UNIT/ML ~~LOC~~ SOPN: 16.0000 [IU] | PEN_INJECTOR | Freq: Every day | SUBCUTANEOUS | 1 refills | Status: DC

## 2016-06-29 NOTE — Patient Instructions (Signed)
Check blood sugars on waking up  daily for now  Also check blood sugars about 2 hours after a meal at least once a day and do this after different meals by rotation  Recommended blood sugar levels on waking up is 90-130 and about 2 hours after meal is 130-160  Please bring your blood sugar monitor to each visit, thank you  TRESIBA insulin: This insulin provides blood sugar control for up to 24 hours.  Start with 16 units at bedtime daily and increase by 2 units every 3 days until the waking up sugars are under 130.   Then continue the same dose. If blood sugar is under 90 for 2 days in a row, reduce the dose by 2 units. Note that this insulin does not control the rise of blood sugar with meals    HUMALOG: Take this before every meal regardless of blood sugar. Take 8 units before breakfast and lunch and 10 units before supper Important to take this right before or with a meal even if you're eating out  Stop drinking Ensure

## 2016-06-29 NOTE — Progress Notes (Signed)
[name], female   DOB: 1946-03-29, 70 y.o.   MRN: [id number]            Reason for Appointment: Consultation for Type 2 Diabetes  Referring physician:[facility]   History of Present Illness:          Date of diagnosis of type 2 diabetes mellitus: 2012       Background history:   She may have been diagnosed with diabetes when she had her kidney transplant in 2012 Apparently was on glipizide initially and she thinks her blood sugars were controlled with this. She could not get Januvia covered by her insurance Her A1c was 6.9 only once in 2014 but subsequently higher  She had been taking Levemir insulin in 2014 but probably for a short time and she does not remember this now  Recent history:   INSULIN regimen is: Humalog  8-14 units based on blood sugar level     Non-insulin hypoglycemic drugs the patient is taking are: None  Her A1c today is 9.3, no other A1c result available since 2015 on her record  Current management, blood sugar patterns and problems identified:  Appears to be on only correction doses of Humalog insulin for her diabetes management.    She is supposed to take Humaloginsulin when blood sugars are over 140 with doses as above.  However she is checking her blood sugars mostly once a day and most likely does not take her insulin with every meal  Blood sugars are averaging over 300 at home  She tends to have excessive thirst and urination and also fatigue.  She has lost weight, previously had weighed about 200 pounds          Side effects from medications have been:none  Compliance with the medical regimen:  fair Hypoglycemia:never    Glucose monitoring:  done <1 times a day         Glucometer: One Touch.      Blood Glucose readings by time of day and averages from meter download:  PREMEAL Breakfast Lunch Dinner Bedtime  Overall   Glucose range: 267-383   312-448 274-376    Median:  285   340 322     Self-care: The diet that the patient  has been following is: tries to limit drinks with sugar .      she will drink Crystal lite, milk, Ensure  Meal times are:  Breakfast is at [employer]: Dinner:   Typical meal intake: Breakfast is cereal or bacon/eggs or fruit salad, lunch is sometimes a salad.  Dinner is meat and vegetables She will have an sugar once a day, has apple or other fruits for snacks                Dietician visit, most recent: Years ago, probably in classes               Exercise:  some walking, active with taking care of grandchild   Weight history:  Wt Readings from Last 3 Encounters:  06/29/16 175 lb (79.4 kg)  06/28/16 174 lb (78.9 kg)  05/20/16 172 lb 3.2 oz (78.1 kg)    Glycemic control:11.8 in 8/17   Lab Results  Component Value Date   HGBA1C 9.3 06/29/2016   HGBA1C 7.9 (H) 04/12/2014   HGBA1C 8.3 (H) 01/09/2014   Lab Results  Component Value Date   MICROALBUR 168.6 ([place]) 10/31/2006   LDLCALC 66 04/06/2013   CREATININE 1.6 (H) 04/12/2014   Lab  Results  Component Value Date   MICRALBCREAT 1587.6 (H) 10/31/2006         Medication List       Accurate as of 06/29/16  4:08 PM. Always use your most recent med list.          amLODipine 5 MG tablet Commonly known as:  NORVASC Take 0.5 tablets (2.5 mg total) by mouth daily.   amoxicillin 500 MG capsule Commonly known as:  AMOXIL TAKE ALL 4 CAPSULES PRIOR TO PROCEDURE AS NEEDED   dapsone 25 MG tablet Take 25 mg by mouth daily.   diphenhydrAMINE 50 MG capsule Commonly known as:  BENADRYL Take 1 capsule (50 mg total) by mouth every 6 (six) hours as needed for itching.   Doxepin HCl 3 MG Tabs Commonly known as:  SILENOR Take 1 tablet (3 mg total) by mouth at bedtime as needed.   gabapentin 100 MG capsule Commonly known as:  NEURONTIN TAKE 1 CAPSULE BY MOUTH 3 TIMES DAILY.   HUMALOG KWIKPEN 100 UNIT/ML KiwkPen Generic drug:  insulin lispro IF SUGAR IS 140-200 USE 8 UNITS, 201-230 USE 10 UNITS, 231-260 USE 12 UNITS, 261-300 USE  14 UNITS, IF GREATER THAN 301 USE 16 UNITS   insulin degludec 100 UNIT/ML Sopn FlexTouch Pen Commonly known as:  TRESIBA FLEXTOUCH Inject 0.16 mLs (16 Units total) into the skin daily.   Insulin Pen Needle 31G X 5 MM Misc Test up to TID dx:250.00   levothyroxine 25 MCG tablet Commonly known as:  SYNTHROID, LEVOTHROID TAKE 2 TABLETS (50 MCG TOTAL) BY MOUTH DAILY.   losartan 25 MG tablet Commonly known as:  COZAAR Take 12.5 mg by mouth at bedtime.   magic mouthwash Soln Take 5 mLs by mouth 4 (four) times daily. Swish, hold and swallow   metoprolol tartrate 25 MG tablet Commonly known as:  LOPRESSOR TAKE 1 TABLET BY MOUTH TWICE A DAY   mycophenolate 180 MG EC tablet Commonly known as:  MYFORTIC Take 360 mg by mouth 2 (two) times daily.   ONETOUCH VERIO test strip Generic drug:  glucose blood USE AS INSTRUCTED 4 TIMES A DAY (BEFORE MEALS) & AT BEDTIME (ICD 10-E11.9)   oxyCODONE-acetaminophen 5-325 MG tablet Commonly known as:  ROXICET Take 0.5-1 tablets by mouth every 6 (six) hours as needed for severe pain.   tacrolimus 1 MG capsule Commonly known as:  PROGRAF Take 4 mg by mouth 2 (two) times daily.   warfarin 5 MG tablet Commonly known as:  COUMADIN TAKE AS DIRECTED BY COUMADIN CLINIC DISPENSE 40 TABLETS 1 MONTH SUPPLY DISREGARD 30 TABLETS       Allergies:  Allergies  Allergen Reactions  . Ibuprofen Nausea And Vomiting  . Sulfamethoxazole-Trimethoprim Swelling  . Sulfonamide Derivatives Swelling  . Tape Rash  . Tramadol Nausea And Vomiting  . Bactrim Swelling  . Hydrocil [Psyllium]     n/v  . Red Dye Itching and Rash    Past Medical History:  Diagnosis Date  . Anemia, iron deficiency    of chronic disease  . Aortic stenosis    a. Severe AS by echo 11/2012.  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 Asystole ([place])    a. During ENT surgery 2005: developed marked asystole requiring CPR, felt due to vagal reaction (cath nonobst dz).  .  Carotid artery disease ([place])    a. Carotid Dopplers performed in August 2013 showed 40-59% left stenosis and 0-39% right; f/u recommended in 2 years.   Marland Kitchen  Cerebrovascular accident [facility]) 2009   a. LMCA infarct felt embolic 8938, maintained on chronic coumadin.; denies residual on 04/05/2013  . CHF (congestive heart failure) ([place])   . Cholelithiasis   . Chronic cough onset 03/2010   Dr Melvyn Novas  . Chronic Persistent Atrial Fibrillation 12/31/2008   Qualifier: Diagnosis of  By: [name]    . Coronary artery disease 05/2002   a. Ant MI 2003 s/p PTCA/stent to RCA.   . Diverticulosis of colon   . Esophagitis, reflux   . ESRD (end stage renal disease) ([place])    a. Mass on L kidney per pt s/p nephrectomy - pt states not cancer - [facility] notes indicate ESRD due to HTN/DM - was previously on HD. b. Kidney transplant 02/2011.  Marland Kitchen GERD (gastroesophageal reflux disease)   . Gout   . Helicobacter pylori (H. pylori) infection    hx of  . Hemorrhoids   . Hx of colonic polyps    adenomatous  . Hyperlipidemia   . Hypertension   . Lung nodule seen on imaging study 04/07/2013   1.0 cm ground glass opacity RUL  . Myocardial infarction 2003  . Pericardial effusion    a. Small by echo 11/2011.  Marland Kitchen Pruritic dermatitis    treated with steriods/UV light  . S/P aortic valve replacement with bioprosthetic valve and maze procedure 04/12/2013   [facility]   . S/P Maze operation for atrial fibrillation 04/12/2013   Complete bilateral atrial lesion set using bipolar radiofrequency and cryothermy ablation with clipping of LA appendage  . Sleep apnea    Pt says testing was positive, intolerant of CPAP.  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  . Type II diabetes mellitus ([place]   . Unspecified hearing loss     Past  Surgical History:  Procedure Laterality Date  . AORTIC VALVE REPLACEMENT N/A 04/12/2013   Procedure: AORTIC VALVE REPLACEMENT (AVR);  Surgeon: CRexene Alberts MD;  [place]  Service: Open Heart Surgery;  Laterality: N/A;  . ARTERIOVENOUS GRAFT PLACEMENT Left   . ARTERIOVENOUS GRAFT PLACEMENT Left    "I've had 2 on my left; had one removed" (04/05/2013)   . ARTERY EXPLORATION Right 04/11/2013   Procedure: ARTERY EXPLORATION;  Surgeon: CRexene Alberts MD;  [place] [facility] [place]  Service: Open Heart Surgery;  Laterality: Right;  Right carotid artery exploration  . AV FISTULA PLACEMENT Right   . AV FISTULA REPAIR Right    "took it out" ((/18/2014)  . CARDIOVERSION  05/29/2012   Procedure: CARDIOVERSION;  Surgeon: BLelon Perla MD;  [facility]  Service: Cardiovascular;  Laterality: N/A;  . CHOLECYSTECTOMY  2009   with hernia removal  . CORONARY ANGIOPLASTY WITH STENT PLACEMENT Right    coronary artery  . INSERTION OF DIALYSIS CATHETER Bilateral    "over the years; took them both out" (04/05/2013)  . INTRAOPERATIVE TRANSESOPHAGEAL ECHOCARDIOGRAM N/A 04/11/2013   Procedure: INTRAOPERATIVE TRANSESOPHAGEAL ECHOCARDIOGRAM;  Surgeon: CRexene Alberts MD;  [place] [place]  Service: Open Heart Surgery;  Laterality: N/A;  . INTRAOPERATIVE TRANSESOPHAGEAL ECHOCARDIOGRAM N/A 04/12/2013   Procedure: INTRAOPERATIVE TRANSESOPHAGEAL ECHOCARDIOGRAM;  Surgeon: CRexene Alberts MD;  [place]  Service: Open Heart Surgery;  Laterality: N/A;  . KIDNEY TRANSPLANT  03/16/11  . LEFT AND RIGHT HEART CATHETERIZATION WITH CORONARY ANGIOGRAM N/A  04/06/2013   Procedure: LEFT AND RIGHT HEART CATHETERIZATION WITH CORONARY ANGIOGRAM;  Surgeon: Blane Ohara, MD;  Location[facility] LAB;  Service: Cardiovascular;  Laterality: N/A;  . MAZE N/A 04/12/2013   Procedure: MAZE;  Surgeon: Rexene Alberts, MD;  Location: [place];  Service: Open Heart Surgery;  Laterality: N/A;  . NASAL RECONSTRUCTION WITH  SEPTAL REPAIR     "took it out" (04/05/2013)  . NEPHRECTOMY Left 2010   no CA on bx  . TONSILLECTOMY    . TOTAL ABDOMINAL HYSTERECTOMY    . TUBAL LIGATION      Family History  Problem Relation Age of Onset  . Stroke Father   . Hypertension Mother   . Diabetes Other   . Diabetes Maternal Grandmother   . Diabetes Son     Social History:  reports that she quit smoking about 14 years ago. Her smoking use included Cigarettes. She has a 30.00 pack-year smoking history. She has never used smokeless tobacco. She reports that she does not drink alcohol or use drugs.   Review of Systems  Constitutional: Negative for weight loss.  HENT: Negative for headaches.   Eyes: Negative for visual disturbance.  Respiratory: Negative for shortness of breath.   Cardiovascular: Positive for leg swelling. Negative for chest pain.       She has leg swelling mostly on the right side, has not discussed treatment with other physicians  Gastrointestinal: Negative for constipation, diarrhea and abdominal pain.  Endocrine: Positive for fatigue and cold intolerance.  Genitourinary: Positive for frequency.       Getting up at night 3-4x  Musculoskeletal: Positive for muscle cramps.  Skin: Negative for rash.  Neurological: Positive for weakness, numbness, tingling and balance difficulty.       For about 2 years she has had sharp pains, and ending and some numbness in her feet.  She thinks her legs are little week.  Has some difficulty with balance and uses a cane  Psychiatric/Behavioral: Positive for insomnia.       Taking low dose doxepin for this     Lipid history: Not on any statin drug    Lab Results  Component Value Date   CHOL 164 04/06/2013   HDL 73 04/06/2013   LDLCALC 66 04/06/2013   LDLDIRECT 89.3 06/13/2006   TRIG 125 04/06/2013   CHOLHDL 2.2 04/06/2013           Hypertension: She has had hypertension for several years, currently taking only 25 mg losartan, 5 mg amlodipine and 25 mg  metoprolol  Most recent eye exam was 10/17  Most recent foot exam:12/17, also   Pains in feet present, not controlled adequately with current regimen of 100 mg gabapentin 2-3 times a day, she thinks 300 mg dose made her sick.  Has been prescribed this by podiatrist  HYPOTHYROIDISM: She does not know what symptoms she had at onset, appears to have had hypothyroidism since 2014 Currently taking 25 g of levothyroxine and no recent TSH available in her record  Lab Results  Component Value Date   TSH 5.47 (H) 01/09/2014   TSH 13.22 (H) 07/10/2013   TSH 1.76 05/25/2012     LABS:  Office Visit on 06/29/2016  Component Date Value Ref Range Status  . Hemoglobin A1C 06/29/2016 9.3   Final  Anti-coag visit on 06/25/2016  Component Date Value Ref Range Status  . INR 06/25/2016 2.6   Final    Physical Examination:  BP (!) 156/80  Pulse 61   Ht '5\' 2"'$  (1.575 m)   Wt 175 lb (79.4 kg)   SpO2 96%   BMI 32.01 kg/m   GENERAL:         Patient has generalized obesity.   HEENT:         Eye exam shows normal external appearance. Fundus exam shows no retinopathy.  Oral exam shows normal mucosa .  NECK:   There is no lymphadenopathy Thyroid is not enlarged and no nodules felt.  Carotids are normal to palpation and no bruit heard LUNGS:         Chest is symmetrical. Lungs are clear to auscultation.Marland Kitchen   HEART:         Heart sounds:  S1 and S2 are normal. No murmur or click heard., no S3 or S4.   ABDOMEN:   There is no distention present. Liver and spleen are not palpable. No other mass or tenderness present.   NEUROLOGICAL:   Ankle jerks are absent bilaterally.    Diabetic Foot Exam - Simple   Simple Foot Form Diabetic Foot exam was performed with the following findings:  Yes   Visual Inspection No deformities, no ulcerations, no other skin breakdown bilaterally:  Yes Sensation Testing Intact to touch and monofilament testing bilaterally:  Yes Pulse Check See comments:   Yes Comments Pedal pulses absent except 2+ right dorsalis pedis            Vibration sense is Markedly reduced in distal first toes. MUSCULOSKELETAL:  There is no swelling or deformity of the peripheral joints. Spine is normal to inspection.   EXTREMITIES:     There is no edema. No skin lesions present.Marland Kitchen SKIN:       No rash or lesions of concern.        ASSESSMENT:  Diabetes type 2, uncontrolled with BMI 32  She has been treated with insulin but has been only on mealtime insulin coverage based on blood sugar which also she is not taking regularly Blood sugars are markedly increased and as high as 448 at home She appears to have significant insulin deficiency She has had minimal diabetes education and has very little knowledge about insulin, self-care of diabetes and meal planning A1c is 9.3 currently  Complications of diabetes: Symptomatic painful peripheral neuropathy, unknown status of urine microalbumin. Macrovascular disease with history of MI and some degree of atherosclerosis in carotids and likely some peripheral vascular disease  History of chronic kidney disease of unclear etiology treated with transplant Her kidney function has been fairly stable and near normal followed by nephrologist regularly  HYPERCALCEMIA: Her calcium levels appear to have been about 10.5, persistently mildly increased and not clear if she has had a thyroid hormone evaluated  HYPERCHOLESTEROLEMIA: She has mild hypercholesterolemia with direct LDL 89, ideally her target should be 70 or less and she is not on a statin drug  HYPOTHYROIDISM: She has had mild hypothyroidism, does complain of fatigue and cold intolerance and has not had a TSH documented recently  Dependent leg edema on the right side: Likely to be from venous insufficiency, recommended that she get elastic stockings for this  PLAN:     Start basal insulin.  Discussed with patient how basal insulin would help her hyperglycemia by  providing 24-hour insulin coverage in between meals.  She can start with 16 units at bedtime of Tresiba.  Given flowsheet that she can use at home to adjust the dose every 3 days by 2 units to  achieve a target of 130 fasting glucose  She will take set doses of Humalog at mealtimes regardless of blood sugar for better compliance, starting with 8 units at breakfast and lunch and 10 units before supper  If she requires more than 20 units of basal insulin may be a candidate for the V-go pump.  Since she does have obesity and likely insulin resistance will benefit from metformin, will consider this once her blood sugars are somewhat improved with starting insulin  She will need education with dietitian and diabetes educator  Discussed basics of meal planning and she will avoid Ensure to reduce excessive carbohydrate and simple sugar intake  Encouraged her to walk as much as possible  Consider using a statin drug  Urine microalbumin/creatinine ratio  For hypercalcemia will evaluate with PTH and vitamin D level.  Also consider bone density evaluation.  Check TSH level  Patient Instructions  Check blood sugars on waking up  daily for now  Also check blood sugars about 2 hours after a meal at least once a day and do this after different meals by rotation  Recommended blood sugar levels on waking up is 90-130 and about 2 hours after meal is 130-160  Please bring your blood sugar monitor to each visit, thank you  TRESIBA insulin: This insulin provides blood sugar control for up to 24 hours.  Start with 16 units at bedtime daily and increase by 2 units every 3 days until the waking up sugars are under 130.   Then continue the same dose. If blood sugar is under 90 for 2 days in a row, reduce the dose by 2 units. Note that this insulin does not control the rise of blood sugar with meals    HUMALOG: Take this before every meal regardless of blood sugar. Take 8 units before breakfast and lunch  and 10 units before supper Important to take this right before or with a meal even if you're eating out  Stop drinking Ensure      Total visit time for multiple medical problems and counseling = 60 minutes  Consultation note has been sent to the referring physician  [facility] 06/29/2016, 4:08 PM   Note: This office note was prepared with Dragon voice recognition system technology. Any transcriptional errors that result from this process are unintentional.

## 2016-07-02 ENCOUNTER — Other Ambulatory Visit (INDEPENDENT_AMBULATORY_CARE_PROVIDER_SITE_OTHER): Payer: Medicare Other

## 2016-07-02 DIAGNOSIS — E063 Autoimmune thyroiditis: Secondary | ICD-10-CM

## 2016-07-02 DIAGNOSIS — E038 Other specified hypothyroidism: Secondary | ICD-10-CM

## 2016-07-02 DIAGNOSIS — E1165 Type 2 diabetes mellitus with hyperglycemia: Secondary | ICD-10-CM

## 2016-07-02 DIAGNOSIS — Z794 Long term (current) use of insulin: Secondary | ICD-10-CM | POA: Diagnosis not present

## 2016-07-02 LAB — BASIC METABOLIC PANEL
BUN: 21 mg/dL (ref 6–23)
CHLORIDE: 106 meq/L (ref 96–112)
CO2: 31 mEq/L (ref 19–32)
Calcium: 10.2 mg/dL (ref 8.4–10.5)
Creatinine, Ser: 1.35 mg/dL — ABNORMAL HIGH (ref 0.40–1.20)
GFR: 49.51 mL/min — AB (ref >60.00)
GLUCOSE: 169 mg/dL — AB (ref 70–99)
POTASSIUM: 3.5 meq/L (ref 3.5–5.1)
SODIUM: 142 meq/L (ref 135–145)

## 2016-07-02 LAB — URINALYSIS, ROUTINE W REFLEX MICROSCOPIC
BILIRUBIN URINE: NEGATIVE
KETONES UR: NEGATIVE
LEUKOCYTES UA: NEGATIVE
Nitrite: NEGATIVE
Specific Gravity, Urine: 1.025 (ref 1.000–1.030)
Total Protein, Urine: >=300 — AB
URINE GLUCOSE: 100 — AB
UROBILINOGEN UA: 0.2 (ref 0.0–1.0)
pH: 6.5 (ref 5.0–8.0)

## 2016-07-02 LAB — T4, FREE: Free T4: 0.96 ng/dL (ref 0.60–1.60)

## 2016-07-02 LAB — TSH: TSH: 2.04 u[IU]/mL (ref 0.35–4.50)

## 2016-07-02 LAB — MICROALBUMIN / CREATININE URINE RATIO
Creatinine,U: 102.6 mg/dL
Microalb Creat Ratio: 309.4 mg/g — ABNORMAL HIGH (ref 0.0–30.0)
Microalb, Ur: 317.3 mg/dL — ABNORMAL HIGH (ref 0.0–1.9)

## 2016-07-02 LAB — VITAMIN D 25 HYDROXY (VIT D DEFICIENCY, FRACTURES): VITD: 14.56 ng/mL — AB (ref 30.00–100.00)

## 2016-07-03 ENCOUNTER — Other Ambulatory Visit: Payer: Self-pay | Admitting: Internal Medicine

## 2016-07-03 LAB — SPECIMEN STATUS REPORT

## 2016-07-04 NOTE — Progress Notes (Signed)
Please let patient know that the kidney test is better, vitamin D level very low, need to start OTC vitamin D3, 5000 units daily

## 2016-07-05 ENCOUNTER — Other Ambulatory Visit: Payer: Self-pay | Admitting: *Deleted

## 2016-07-05 ENCOUNTER — Other Ambulatory Visit: Payer: Self-pay

## 2016-07-05 LAB — SPECIMEN STATUS REPORT

## 2016-07-05 LAB — PARATHYROID HORMONE, INTACT (NO CA): PTH: 76 pg/mL — AB (ref 15–65)

## 2016-07-05 MED ORDER — INSULIN PEN NEEDLE 31G X 5 MM MISC: 5 refills | Status: DC

## 2016-07-07 ENCOUNTER — Encounter: Payer: Medicare Other | Admitting: Nutrition

## 2016-07-07 ENCOUNTER — Other Ambulatory Visit: Payer: Self-pay | Admitting: *Deleted

## 2016-07-07 MED ORDER — GABAPENTIN 100 MG PO CAPS: 100.0000 mg | ORAL_CAPSULE | Freq: Three times a day (TID) | ORAL | 1 refills | Status: DC

## 2016-07-07 NOTE — Telephone Encounter (Signed)
Rec'd fax stating pt needing pen needles for Humalog & Triseba. Per chart pt endocrinologist sent rx on 07/05/16...[name]

## 2016-07-07 NOTE — Addendum Note (Signed)
Addended by: Earnstine Regal on: 07/07/2016 09:25 AM   Modules accepted: Orders

## 2016-07-14 ENCOUNTER — Encounter: Payer: Medicare Other | Admitting: Nutrition

## 2016-07-23 ENCOUNTER — Other Ambulatory Visit: Payer: Self-pay | Admitting: *Deleted

## 2016-07-23 ENCOUNTER — Ambulatory Visit (INDEPENDENT_AMBULATORY_CARE_PROVIDER_SITE_OTHER): Payer: Medicare Other | Admitting: Pharmacist

## 2016-07-23 DIAGNOSIS — I4891 Unspecified atrial fibrillation: Secondary | ICD-10-CM | POA: Diagnosis not present

## 2016-07-23 DIAGNOSIS — Z5181 Encounter for therapeutic drug level monitoring: Secondary | ICD-10-CM | POA: Diagnosis not present

## 2016-07-23 LAB — POCT INR: INR: 3.1

## 2016-07-23 MED ORDER — ONETOUCH DELICA LANCETS 33G MISC: 5 refills | Status: DC

## 2016-07-26 DIAGNOSIS — Z940 Kidney transplant status: Secondary | ICD-10-CM | POA: Diagnosis not present

## 2016-07-26 DIAGNOSIS — R809 Proteinuria, unspecified: Secondary | ICD-10-CM | POA: Diagnosis not present

## 2016-07-26 DIAGNOSIS — E119 Type 2 diabetes mellitus without complications: Secondary | ICD-10-CM | POA: Diagnosis not present

## 2016-07-26 DIAGNOSIS — I10 Essential (primary) hypertension: Secondary | ICD-10-CM | POA: Diagnosis not present

## 2016-07-26 DIAGNOSIS — D899 Disorder involving the immune mechanism, unspecified: Secondary | ICD-10-CM | POA: Diagnosis not present

## 2016-07-27 ENCOUNTER — Encounter: Payer: Medicare Other | Attending: Endocrinology | Admitting: Nutrition

## 2016-07-27 DIAGNOSIS — E1165 Type 2 diabetes mellitus with hyperglycemia: Secondary | ICD-10-CM | POA: Insufficient documentation

## 2016-07-27 DIAGNOSIS — E1121 Type 2 diabetes mellitus with diabetic nephropathy: Secondary | ICD-10-CM

## 2016-07-27 DIAGNOSIS — Z794 Long term (current) use of insulin: Secondary | ICD-10-CM | POA: Diagnosis not present

## 2016-07-27 DIAGNOSIS — Z713 Dietary counseling and surveillance: Secondary | ICD-10-CM | POA: Diagnosis not present

## 2016-07-27 NOTE — Progress Notes (Signed)
Patient reports that she was  not taking the [place].  Got the flu and said was very sick and would fall asleep at 8PM and wake up after 2AM.  She thought is was too late to take the insulin, and so, no [place] for 5 days.  FBS today was 364. She is taking the Humalog before meals:  Per sliding scale.  Explained how this insulin works, and why she needs to take this.  Also explained that she can take this at different times, without any consequence.  She reported good understanding, and agreed to take this.   Diet appears balanced, except for cold cereal, 3 days per week.  Gave other suggestions for choices at breakfast on these days.   She was given my number, and told to test her blood sugars before each meal and at bedtime for the next week, and to call me on Tuesday with readings.  She agreed to do this. Written instructions were given for Tresiba and Humalog doses.

## 2016-07-28 ENCOUNTER — Telehealth: Payer: Self-pay | Admitting: Endocrinology

## 2016-07-28 MED ORDER — INSULIN PEN NEEDLE 31G X 5 MM MISC: 5 refills | Status: DC

## 2016-07-28 NOTE — Telephone Encounter (Signed)
Refill submitted. 

## 2016-07-28 NOTE — Telephone Encounter (Signed)
[facility] needs refills called in for the pen needles BD pen needles for the insulin

## 2016-07-29 NOTE — Patient Instructions (Addendum)
Take Tresiba insulin 16units once a day Take Humalog insulin 10 min. Before each meal Test blood sugars before meals and at bedtime and call me in one week.  [phone]

## 2016-08-03 ENCOUNTER — Ambulatory Visit: Payer: Medicare Other | Admitting: Endocrinology

## 2016-08-20 ENCOUNTER — Ambulatory Visit (INDEPENDENT_AMBULATORY_CARE_PROVIDER_SITE_OTHER): Payer: Medicare Other | Admitting: Pharmacist

## 2016-08-20 DIAGNOSIS — I4891 Unspecified atrial fibrillation: Secondary | ICD-10-CM

## 2016-08-20 DIAGNOSIS — Z5181 Encounter for therapeutic drug level monitoring: Secondary | ICD-10-CM | POA: Diagnosis not present

## 2016-08-20 LAB — POCT INR: INR: 2.8

## 2016-08-26 DIAGNOSIS — N2581 Secondary hyperparathyroidism of renal origin: Secondary | ICD-10-CM | POA: Diagnosis not present

## 2016-08-26 DIAGNOSIS — Z940 Kidney transplant status: Secondary | ICD-10-CM | POA: Diagnosis not present

## 2016-09-28 ENCOUNTER — Other Ambulatory Visit: Payer: Self-pay | Admitting: Internal Medicine

## 2016-09-29 DIAGNOSIS — N184 Chronic kidney disease, stage 4 (severe): Secondary | ICD-10-CM | POA: Diagnosis not present

## 2016-09-29 DIAGNOSIS — E119 Type 2 diabetes mellitus without complications: Secondary | ICD-10-CM | POA: Diagnosis not present

## 2016-09-29 DIAGNOSIS — Z940 Kidney transplant status: Secondary | ICD-10-CM | POA: Diagnosis not present

## 2016-09-29 DIAGNOSIS — N2581 Secondary hyperparathyroidism of renal origin: Secondary | ICD-10-CM | POA: Diagnosis not present

## 2016-09-29 DIAGNOSIS — E785 Hyperlipidemia, unspecified: Secondary | ICD-10-CM | POA: Diagnosis not present

## 2016-10-01 ENCOUNTER — Ambulatory Visit (INDEPENDENT_AMBULATORY_CARE_PROVIDER_SITE_OTHER): Payer: Medicare Other | Admitting: Pharmacist

## 2016-10-01 DIAGNOSIS — I4891 Unspecified atrial fibrillation: Secondary | ICD-10-CM

## 2016-10-01 DIAGNOSIS — Z5181 Encounter for therapeutic drug level monitoring: Secondary | ICD-10-CM | POA: Diagnosis not present

## 2016-10-01 LAB — POCT INR: INR: 2.1

## 2016-10-05 ENCOUNTER — Encounter: Payer: Self-pay | Admitting: Podiatry

## 2016-10-05 ENCOUNTER — Other Ambulatory Visit: Payer: Self-pay | Admitting: Podiatry

## 2016-10-05 ENCOUNTER — Ambulatory Visit (INDEPENDENT_AMBULATORY_CARE_PROVIDER_SITE_OTHER): Payer: Medicare Other | Admitting: Podiatry

## 2016-10-05 ENCOUNTER — Ambulatory Visit: Payer: Medicare Other

## 2016-10-05 DIAGNOSIS — E1151 Type 2 diabetes mellitus with diabetic peripheral angiopathy without gangrene: Secondary | ICD-10-CM | POA: Diagnosis not present

## 2016-10-05 DIAGNOSIS — E1140 Type 2 diabetes mellitus with diabetic neuropathy, unspecified: Secondary | ICD-10-CM | POA: Diagnosis not present

## 2016-10-05 DIAGNOSIS — M79605 Pain in left leg: Principal | ICD-10-CM

## 2016-10-05 DIAGNOSIS — M79671 Pain in right foot: Secondary | ICD-10-CM

## 2016-10-05 DIAGNOSIS — M79672 Pain in left foot: Secondary | ICD-10-CM

## 2016-10-05 DIAGNOSIS — E1149 Type 2 diabetes mellitus with other diabetic neurological complication: Secondary | ICD-10-CM

## 2016-10-05 DIAGNOSIS — M79604 Pain in right leg: Secondary | ICD-10-CM

## 2016-10-05 DIAGNOSIS — M21619 Bunion of unspecified foot: Secondary | ICD-10-CM | POA: Diagnosis not present

## 2016-10-05 NOTE — Progress Notes (Signed)
Subjective:     [name], female   DOB: 25-Apr-1946, 71 y.o.   MRN: [id number]  HPI patient presents with chronic pain in both feet structural bunion deformity and pain in her lower legs with ambulation with cramping. She is a long-term history of diabetes and does take insulin   Review of Systems     Objective:   Physical Exam Vascular status was found to be significant diminished with no indication of palpable pulses bilateral with patient found have structural red bunion deformity bilateral and also does have neurological disease with diminished sharp [employer] vibratory. Patient's found have good digital perfusion at this time and is well oriented 3    Assessment:     At risk diabetic could have vascular disease as part of the pain she's experienced in her legs and feet and also needs diabetic shoes secondary to deformity and good success with him from several years ago    Plan:     H&P conditions reviewed and we'll send off for approval of diabetic shoes. Referred her for vascular evaluation with possible treatment if symptoms indicate

## 2016-10-08 DIAGNOSIS — Z940 Kidney transplant status: Secondary | ICD-10-CM | POA: Diagnosis not present

## 2016-10-13 ENCOUNTER — Ambulatory Visit
Admission: RE | Admit: 2016-10-13 | Discharge: 2016-10-13 | Disposition: A | Discharge status: Home / Self Care | Payer: Medicare Other | Source: Ambulatory Visit | Attending: Podiatry | Admitting: Podiatry

## 2016-10-13 DIAGNOSIS — M79604 Pain in right leg: Secondary | ICD-10-CM | POA: Diagnosis not present

## 2016-10-13 DIAGNOSIS — M79671 Pain in right foot: Secondary | ICD-10-CM | POA: Diagnosis not present

## 2016-10-13 DIAGNOSIS — M79605 Pain in left leg: Principal | ICD-10-CM

## 2016-10-13 DIAGNOSIS — M79672 Pain in left foot: Secondary | ICD-10-CM | POA: Diagnosis not present

## 2016-10-13 NOTE — Consult Note (Signed)
Chief Complaint: My feet hurt.   Referring Physician(s): Regal,Norman S  History of Present Illness: [name] is a 71 y.o. female  presenting to [facility] today as a scheduled consultation, kindly referred by Dr. Paulla Dolly, for evaluation of bilateral foot pain and possible CLI as etiology.   She tells me that she has been having bilateral foot pain on the bottom of her feet, which is not present all the time.  It seems to be most severe at night time, and will sometimes wake her from sleep.  She describes it as a sharp quality, 10/10 intensity.  When this occurs, she uses heating pad and other ointments to treat it.  She does not describe the typical behavior of hanging her feet from the side of bed, or the need to ambulate around the room to feel better.   She denies ever having a wound on her legs.  She is able to walk comfortably, although she does walk with a limp and tells me that she has more trouble now that her "walking cane was stolen".     She has a history of vascular disease, including prior stroke and heart disease.    Cardiovascular risk factors include: Smoking, diabetes, HTN,  Known CVD with stroke, known PAD with carotid disease,known CAD, family history   Other medical history includes: AS, PAD, stroke, MI, CHF, prior kidney transplant  Surgical history includes: Open heart with Aortic Valve replacement, Maze procedure 04/12/2013, kidney transplant at [place] history includes: 55 pack-year history.   Cardiologist: Dr. [name] PCP: Dr. Alain Marion Podiatrist: Dr. Paulla Dolly CT Sx: Dr. Roxy Manns Tx Sx: Dr. Roxy Horseman.  Past Medical History:  Diagnosis Date  . Anemia, iron deficiency    of chronic disease  . Aortic stenosis    a. Severe AS by echo 11/2012.  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 Asystole ([place])    a. During ENT surgery 2005: developed marked asystole requiring CPR, felt due to  vagal reaction (cath nonobst dz).  . Carotid artery disease ([place])    a. Carotid Dopplers performed in August 2013 showed 40-59% left stenosis and 0-39% right; f/u recommended in 2 years.   . Cerebrovascular accident [facility]) 2009   a. LMCA infarct felt embolic 4196, maintained on chronic coumadin.; denies residual on 04/05/2013  . CHF (congestive heart failure) ([place])   . Cholelithiasis   . Chronic cough onset 03/2010   Dr Melvyn Novas  . Chronic Persistent Atrial Fibrillation 12/31/2008   Qualifier: Diagnosis of  By: [name]    . Coronary artery disease 05/2002   a. Ant MI 2003 s/p PTCA/stent to RCA.   . Diverticulosis of colon   . Esophagitis, reflux   . ESRD (end stage renal disease) ([place])    a. Mass on L kidney per pt s/p nephrectomy - pt states not cancer - [facility] notes indicate ESRD due to HTN/DM - was previously on HD. b. Kidney transplant 02/2011.  Marland Kitchen GERD (gastroesophageal reflux disease)   . Gout   . Helicobacter pylori (H. pylori) infection    hx of  . Hemorrhoids   . Hx of colonic polyps    adenomatous  . Hyperlipidemia   . Hypertension   . Lung nodule seen on imaging study 04/07/2013   1.0 cm ground glass opacity RUL  . Myocardial infarction 2003  . Pericardial effusion    a. Small by echo 11/2011.  Marland Kitchen Pruritic  dermatitis    treated with steriods/UV light  . S/P aortic valve replacement with bioprosthetic valve and maze procedure 04/12/2013   38m EViewpoint Assessment CenterEase bovine pericardial tissue valve   . S/P Maze operation for atrial fibrillation 04/12/2013   Complete bilateral atrial lesion set using bipolar radiofrequency and cryothermy ablation with clipping of LA appendage  . Sleep apnea    Pt says testing was positive, intolerant of CPAP.  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  . Type II diabetes mellitus ([place]   .  Unspecified hearing loss     Past Surgical History:  Procedure Laterality Date  . AORTIC VALVE REPLACEMENT N/A 04/12/2013   Procedure: AORTIC VALVE REPLACEMENT (AVR);  Surgeon: CRexene Alberts MD;  [place]  Service: Open Heart Surgery;  Laterality: N/A;  . ARTERIOVENOUS GRAFT PLACEMENT Left   . ARTERIOVENOUS GRAFT PLACEMENT Left    "I've had 2 on my left; had one removed" (04/05/2013)   . ARTERY EXPLORATION Right 04/11/2013   Procedure: ARTERY EXPLORATION;  Surgeon: CRexene Alberts MD;  [place]  Service: Open Heart Surgery;  Laterality: Right;  Right carotid artery exploration  . AV FISTULA PLACEMENT Right   . AV FISTULA REPAIR Right    "took it out" ((/18/2014)  . CARDIOVERSION  05/29/2012   Procedure: CARDIOVERSION;  Surgeon: BLelon Perla MD;  [facility]  Service: Cardiovascular;  Laterality: N/A;  . CHOLECYSTECTOMY  2009   with hernia removal  . CORONARY ANGIOPLASTY WITH STENT PLACEMENT Right    coronary artery  . INSERTION OF DIALYSIS CATHETER Bilateral    "over the years; took them both out" (04/05/2013)  . INTRAOPERATIVE TRANSESOPHAGEAL ECHOCARDIOGRAM N/A 04/11/2013   Procedure: INTRAOPERATIVE TRANSESOPHAGEAL ECHOCARDIOGRAM;  Surgeon: CRexene Alberts MD;  [place]  Service: Open Heart Surgery;  Laterality: N/A;  . INTRAOPERATIVE TRANSESOPHAGEAL ECHOCARDIOGRAM N/A 04/12/2013   Procedure: INTRAOPERATIVE TRANSESOPHAGEAL ECHOCARDIOGRAM;  Surgeon: CRexene Alberts MD;  [place] [facility]  Service: Open Heart Surgery;  Laterality: N/A;  . KIDNEY TRANSPLANT  03/16/11  . LEFT AND RIGHT HEART CATHETERIZATION WITH CORONARY ANGIOGRAM N/A 04/06/2013   Procedure: LEFT AND RIGHT HEART CATHETERIZATION WITH CORONARY ANGIOGRAM;  Surgeon: MBlane Ohara MD;  [facility];  Service: Cardiovascular;  Laterality: N/A;  . MAZE N/A 04/12/2013   Procedure: MAZE;  Surgeon: CRexene Alberts MD;  [place]: [place]  Service: Open Heart Surgery;  Laterality:  N/A;  . NASAL RECONSTRUCTION WITH SEPTAL REPAIR     "took it out" (04/05/2013)  . NEPHRECTOMY Left 2010   no CA on bx  . TONSILLECTOMY    . TOTAL ABDOMINAL HYSTERECTOMY    . TUBAL LIGATION      Allergies: Ibuprofen; Sulfamethoxazole-trimethoprim; Sulfonamide derivatives; Tape; Tramadol; Bactrim; Hydrocil [psyllium]; and Red dye  Medications: Prior to Admission medications   Medication Sig Start Date End Date Taking? Authorizing Provider  Alum & Mag Hydroxide-Simeth (MAGIC MOUTHWASH) SOLN Take 5 mLs by mouth 4 (four) times daily. Swish, hold and swallow 10/09/13  Yes Aleksei Plotnikov V, MD  amLODipine (NORVASC) 5 MG tablet TAKE 1/2 (ONE HALF) TABLET BY MOUTH AT BEDTIME 07/04/16  Yes Aleksei Plotnikov V, MD  amoxicillin (AMOXIL) 500 MG capsule TAKE ALL 4 CAPSULES PRIOR TO PROCEDURE AS NEEDED 09/28/16  Yes Aleksei Plotnikov V, MD  dapsone 25 MG tablet Take  25 mg by mouth daily.    Yes Historical Provider, MD  diphenhydrAMINE (BENADRYL) 50 MG capsule Take 1 capsule (50 mg total) by mouth every 6 (six) hours as needed for itching. 08/31/11  Yes Aleksei Plotnikov V, MD  Doxepin HCl (SILENOR) 3 MG TABS Take 1 tablet (3 mg total) by mouth at bedtime as needed. 02/25/16  Yes Aleksei Plotnikov V, MD  gabapentin (NEURONTIN) 100 MG capsule Take 1 capsule (100 mg total) by mouth 3 (three) times daily. 07/07/16  Yes Aleksei Plotnikov V, MD  HUMALOG KWIKPEN 100 UNIT/ML KiwkPen IF SUGAR:140-200 USE 8 UNITS,201-230 10 UNITS,231-260 12 UNITS,261-300 14 UNITS,>301 16 UNITS 07/04/16  Yes Aleksei Plotnikov V, MD  insulin degludec (TRESIBA FLEXTOUCH) 100 UNIT/ML SOPN FlexTouch Pen Inject 0.16 mLs (16 Units total) into the skin daily. 06/29/16  Yes Elayne Snare, MD  Insulin Pen Needle 31G X 5 MM MISC Use to administer insulin four times a day Dx E11.9 07/28/16  Yes Elayne Snare, MD  levothyroxine (SYNTHROID, LEVOTHROID) 25 MCG tablet TAKE 2 TABLETS (50 MCG TOTAL) BY MOUTH DAILY. 04/26/16  Yes Aleksei Plotnikov V, MD    losartan (COZAAR) 25 MG tablet Take 12.5 mg by mouth at bedtime. 02/09/16  Yes Historical Provider, MD  metoprolol tartrate (LOPRESSOR) 25 MG tablet TAKE 1 TABLET BY MOUTH TWICE A DAY 05/25/16  Yes Lelon Perla, MD  mycophenolate (MYFORTIC) 180 MG EC tablet Take 360 mg by mouth 2 (two) times daily.    Yes Historical Provider, MD  [facility] DELICA LANCETS 00T MISC Use to check blood sugars twice a day DX E11.9 07/23/16  Yes Aleksei Plotnikov V, MD  ONETOUCH VERIO test strip USE AS INSTRUCTED 4 TIMES A DAY (BEFORE MEALS) & AT BEDTIME (ICD 10-E11.9) 07/22/15  Yes Aleksei Plotnikov V, MD  tacrolimus (PROGRAF) 1 MG capsule Take 4 mg by mouth 2 (two) times daily.    Yes Historical Provider, MD  warfarin (COUMADIN) 5 MG tablet TAKE AS DIRECTED BY COUMADIN CLINIC DISPENSE 40 TABLETS 1 MONTH SUPPLY DISREGARD 30 TABLETS 05/28/16  Yes Lelon Perla, MD  oxyCODONE-acetaminophen (ROXICET) 5-325 MG tablet Take 0.5-1 tablets by mouth every 6 (six) hours as needed for severe pain. Patient not taking: Reported on 10/13/2016 02/25/16   [name], MD     Family History  Problem Relation Age of Onset  . Stroke Father   . Hypertension Mother   . Diabetes Other   . Diabetes Maternal Grandmother   . Diabetes Son     Social History   Social History  . Marital status: Married    Spouse name: N/A  . Number of children: 5  . Years of education: N/A   Occupational History  . retired Retired   Social History Main Topics  . Smoking status: Former Smoker    Packs/day: 1.00    Years: 30.00    Types: Cigarettes    Quit date: 07/19/2001  . Smokeless tobacco: Never Used  . Alcohol use No  . Drug use: No  . Sexual activity: Not Currently   Other Topics Concern  . Not on file   Social History Narrative   Patient signed a Designated Party Release to allow her spouse [name] and family and five children to have access to her medical records/information.        Review of Systems: A 12 point ROS  discussed and pertinent positives are indicated in the HPI above.  All other systems are negative.  Review of Systems  Vital Signs: BP (!) 196/94 (BP Location: Left Arm, Patient Position: Sitting, Cuff Size: Normal)   Pulse (!) 57   Temp 98.6 F (37 C) (Oral)   Resp 14   Ht '5\' 1"'$  (1.549 m)   Wt 164 lb (74.4 kg)   SpO2 97%   BMI 30.99 kg/m   Physical Exam General: 71 yo female appearing   stated age.  Well-developed, well-nourished.  No distress. HEENT: Atraumatic, normocephalic.  Conjugate gaze, extra-ocular motor intact. No scleral icterus or scleral injection. No lesions on external ears, nose, lips, or gums.  Oral mucosa moist, pink.  Neck: Symmetric with no goiter enlargement.  Chest/Lungs:  Symmetric chest with inspiration/expiration.  No labored breathing.  Clear to auscultation with no wheezes, rhonchi, or rales.  Heart:  RRR, with no third heart sounds appreciated. No JVD appreciated.  Abdomen:  Soft, NT/ND, with + bowel sounds.   Genito-urinary: Deferred Neurologic: Alert & Oriented to person, place, and time.   Normal affect and insight.  Appropriate questions.  Moving all 4 extremities with gross sensory intact.  Pulse Exam:  Nonpalpable pulses at the feet.  Nonpalpable popliteal pulses.  Doppler positive on the left at [facility] & PT.  Doppler positive on the right at PT.  No DP was found by doppler.  Extremities: No wound.  Hairless lower extremity.  No infection or swelling.  Mallampati Score:     Imaging: US Arterial Seg Multiple  Result Date: 10/13/2016 CLINICAL DATA:  71 year old female with a history of foot pain. Cardiovascular risk factors include smoking history, known coronary artery disease, hypertension, diabetes. EXAM: NONINVASIVE PHYSIOLOGIC VASCULAR STUDY OF BILATERAL LOWER EXTREMITIES TECHNIQUE: Evaluation of both lower extremities was performed at rest, including calculation of ankle-brachial indices, multiple segmental pressure evaluation, segmental Doppler  and segmental pulse volume recording. COMPARISON:  None. FINDINGS: Right: Resting ankle brachial index:  0.47 Segmental blood pressure: Left brachial pressure not acquired. Decrease from the brachial pressure to the right thigh. Right digital pressure measures 60 systolic Doppler: Segmental Doppler of the right lower extremity demonstrates monophasic waveform throughout. Pulse volume recording: Segmental PVR of the right lower extremity demonstrates decrease quality of the waveform. Near flat line at the right great toe. Left: Resting ankle brachial index:  0.71 Segmental blood pressure: Left brachial pressure not measured. Significant drop from the high thigh to low thigh. Left digital pressure measures 66 systolic Doppler: Segmental Doppler of the left lower extremity demonstrates monophasic waveform throughout Pulse volume recording: Segmental PVR of the left lower extremity demonstrates abnormal quality with decreased amplitude at the ankle. Flat line at the left great toe. Additional: IMPRESSION: Right: Resting ankle-brachial index in the moderate range of arterial occlusive disease. Remainder of the study is suggestive of multilevel arterial occlusive disease, including aortoiliac disease. Left: Resting ankle-brachial index in the mild range of arterial occlusive disease. Remainder of the study suggests multilevel arterial occlusive disease, including aortoiliac disease. Signed, Dulcy Fanny. Earleen Newport, DO [facility] Radiology Electronically Signed   By: Corrie Mckusick D.O.   On: 10/13/2016 17:55    Labs:  CBC: No results for input(s): WBC, HGB, HCT, PLT in the last 8760 hours.  COAGS:  Recent Labs  06/25/16 1502 07/23/16 1455 08/20/16 1529 10/01/16 1456  INR 2.6 3.1 2.8 2.1    BMP:  Recent Labs  07/02/16 1440  NA 142  K 3.5  CL 106  CO2 31  GLUCOSE 169*  BUN 21  CALCIUM 10.2  CREATININE 1.35*  LIVER FUNCTION TESTS: No results for  input(s): BILITOT, AST, ALT, ALKPHOS, PROT, ALBUMIN in the last 8760 hours.  TUMOR MARKERS: No results for input(s): AFPTM, CEA, CA199, CHROMGRNA in the last 8760 hours.  Assessment and Plan:  Assessment:  Ms Vandagriff is a 71 year old female presenting with lower extremity pain at the soles of her feet, concerning for resting pain related to PAD/CLI.  She has multiple CV risk factors placing her at risk for CLI.   Non-invasive lower extremity exam and imaging work-up shows evidence of multi-segmental disease from the bilateral iliac arteries to the calf.  The right ABI is 0.47.  The left is 0.71.    I had a long discussion with Ms Puchalski regarding anatomy, pathology/pathophysiology, natural history, and prognosis of PAD/CLI.  My impression is that we need to look further into her pattern of disease to identify possible targets for revascularization.  I discussed with her my recommendation to get imaging so that we could plan an intervention.  Given her renal disease, I would order a non-contrast MRI/MRA run-off, which is a protocol now available (no gadolinium needed).    After planning study, I told her we would be able to offer her a surgical plan to improve her blood flow and relieve her symptoms.    After our discussion, she expressed to me that she does not feel compelled to pursue any more surgeries right now, and thus imaging or any angiogram at this point would be moot.       I explained that given our knowledge of her multiple CV risk factors, surveillance is important.  I will schedule her for a 6 month follow up appointment.   Plan: - Given her desire not to proceed with any specific treatment, we will defer non-contrast MRA runoff for a 6 month follow up appointment for surveillance.  She may call any time if her symptoms change.  - Continue maximal medical therapy for cardiovascular risk reduction.  Though she is on coumadin for her heart valve, it may be reasonable to consider  adding '81mg'$  ASA for risk-reduction.  - An HMG-Co Reductase inhibitor may be considered for risk-reduction.   -Observe healthy foot care habits, given the presence of diabetes, with at least annual foot inspection performed in the setting of DM.      ___________________________________________________________________   1Morley Kos MD, et al. 2016 AHA/ACC Guideline on the Management of Patients With Lower Extremity Peripheral Artery Disease: Executive Summary: A Report of the American College of Cardiology/American Heart Association Task Force on Clinical Practice Guidelines. J Am Coll Cardiol. 2017 Mar 21;69(11):1465-1508. doi: 10.1016/j.jacc.2016.11.008.   2 - Norgren L, et al. TASC II Working Group. Inter-society consensus for the management of peripheral arterial disease. Int Tressia Miners. 2007 Jun;26(2):81-157. Review. PubMed PMID: 21194174  3 - Hingorani A, et al. The management of diabetic foot: A clinical practice guideline by the Society for Vascular Surgery in collaboration with the [employer] and the Society  for Vascular Medicine. J Vasc Surg. 2016 Feb;63(2 Suppl):3S-21S. doi: 10.1016/j.jvs.2015.10.003. PubMed PMID: 08144818.   Thank you for this interesting consult.  I greatly enjoyed meeting Naje A Duford and look forward to participating in their care.  A copy of this report was sent to the requesting provider on this date.  Electronically Signed: Corrie Mckusick 10/13/2016, 5:58 PM   I spent a total of  40 Minutes   in face to face in clinical consultation, greater than 50% of which was counseling/coordinating care  for foot pain, possible CLI, possible angiogram/angioplasty

## 2016-10-22 ENCOUNTER — Telehealth: Payer: Self-pay | Admitting: Internal Medicine

## 2016-10-22 NOTE — Telephone Encounter (Signed)
Called patient to schedule awv. Lvm for patient to call office to schedule appt.  °

## 2016-10-26 DIAGNOSIS — N184 Chronic kidney disease, stage 4 (severe): Secondary | ICD-10-CM | POA: Diagnosis not present

## 2016-10-27 ENCOUNTER — Ambulatory Visit: Payer: Medicare Other | Admitting: Internal Medicine

## 2016-10-27 DIAGNOSIS — R809 Proteinuria, unspecified: Secondary | ICD-10-CM | POA: Diagnosis not present

## 2016-10-27 DIAGNOSIS — E119 Type 2 diabetes mellitus without complications: Secondary | ICD-10-CM | POA: Diagnosis not present

## 2016-10-27 DIAGNOSIS — Z940 Kidney transplant status: Secondary | ICD-10-CM | POA: Diagnosis not present

## 2016-10-27 DIAGNOSIS — I10 Essential (primary) hypertension: Secondary | ICD-10-CM | POA: Diagnosis not present

## 2016-10-27 DIAGNOSIS — D899 Disorder involving the immune mechanism, unspecified: Secondary | ICD-10-CM | POA: Diagnosis not present

## 2016-10-30 ENCOUNTER — Other Ambulatory Visit: Payer: Self-pay | Admitting: Cardiology

## 2016-11-01 NOTE — Telephone Encounter (Signed)
Rx(s) sent to pharmacy electronically.  

## 2016-11-02 DIAGNOSIS — H524 Presbyopia: Secondary | ICD-10-CM | POA: Diagnosis not present

## 2016-11-02 DIAGNOSIS — H2513 Age-related nuclear cataract, bilateral: Secondary | ICD-10-CM | POA: Diagnosis not present

## 2016-11-02 DIAGNOSIS — H16142 Punctate keratitis, left eye: Secondary | ICD-10-CM | POA: Diagnosis not present

## 2016-11-02 DIAGNOSIS — H04123 Dry eye syndrome of bilateral lacrimal glands: Secondary | ICD-10-CM | POA: Diagnosis not present

## 2016-11-02 DIAGNOSIS — H11153 Pinguecula, bilateral: Secondary | ICD-10-CM | POA: Diagnosis not present

## 2016-11-10 ENCOUNTER — Other Ambulatory Visit: Payer: Self-pay | Admitting: Endocrinology

## 2016-11-10 DIAGNOSIS — E1165 Type 2 diabetes mellitus with hyperglycemia: Secondary | ICD-10-CM

## 2016-11-10 DIAGNOSIS — Z794 Long term (current) use of insulin: Principal | ICD-10-CM

## 2016-11-11 ENCOUNTER — Ambulatory Visit (INDEPENDENT_AMBULATORY_CARE_PROVIDER_SITE_OTHER): Payer: Medicare Other | Admitting: Pharmacist Clinician (PhC)/ Clinical Pharmacy Specialist

## 2016-11-11 DIAGNOSIS — Z5181 Encounter for therapeutic drug level monitoring: Secondary | ICD-10-CM | POA: Diagnosis not present

## 2016-11-11 DIAGNOSIS — I4891 Unspecified atrial fibrillation: Secondary | ICD-10-CM

## 2016-11-11 LAB — POCT INR: INR: 3.6

## 2016-11-12 ENCOUNTER — Other Ambulatory Visit: Payer: Self-pay | Admitting: Internal Medicine

## 2016-11-16 ENCOUNTER — Encounter: Payer: Self-pay | Admitting: Internal Medicine

## 2016-11-16 ENCOUNTER — Other Ambulatory Visit (INDEPENDENT_AMBULATORY_CARE_PROVIDER_SITE_OTHER): Payer: Medicare Other

## 2016-11-16 ENCOUNTER — Ambulatory Visit (INDEPENDENT_AMBULATORY_CARE_PROVIDER_SITE_OTHER): Payer: Medicare Other | Admitting: Internal Medicine

## 2016-11-16 VITALS — BP 142/74 | HR 57 | Temp 98.5°F | Ht 61.0 in | Wt 168.1 lb

## 2016-11-16 DIAGNOSIS — E1121 Type 2 diabetes mellitus with diabetic nephropathy: Secondary | ICD-10-CM

## 2016-11-16 DIAGNOSIS — G8929 Other chronic pain: Secondary | ICD-10-CM

## 2016-11-16 DIAGNOSIS — M6258 Muscle wasting and atrophy, not elsewhere classified, other site: Secondary | ICD-10-CM

## 2016-11-16 DIAGNOSIS — M5440 Lumbago with sciatica, unspecified side: Secondary | ICD-10-CM | POA: Diagnosis not present

## 2016-11-16 DIAGNOSIS — E785 Hyperlipidemia, unspecified: Secondary | ICD-10-CM

## 2016-11-16 DIAGNOSIS — N186 End stage renal disease: Secondary | ICD-10-CM

## 2016-11-16 LAB — BASIC METABOLIC PANEL
BUN: 27 mg/dL — ABNORMAL HIGH (ref 6–23)
CHLORIDE: 105 meq/L (ref 96–112)
CO2: 31 mEq/L (ref 19–32)
Calcium: 10 mg/dL (ref 8.4–10.5)
Creatinine, Ser: 1.49 mg/dL — ABNORMAL HIGH (ref 0.40–1.20)
GFR: 44.13 mL/min — ABNORMAL LOW (ref >60.00)
Glucose, Bld: 142 mg/dL — ABNORMAL HIGH (ref 70–99)
POTASSIUM: 4.1 meq/L (ref 3.5–5.1)
Sodium: 141 mEq/L (ref 135–145)

## 2016-11-16 LAB — CBC WITH DIFFERENTIAL/PLATELET
BASOS PCT: 0.8 % (ref 0.0–3.0)
Basophils Absolute: 0 10*3/uL (ref 0.0–0.1)
EOS PCT: 2.4 % (ref 0.0–5.0)
Eosinophils Absolute: 0.1 10*3/uL (ref 0.0–0.7)
HEMATOCRIT: 39.7 % (ref 36.0–46.0)
Hemoglobin: 12.2 g/dL (ref 12.0–15.0)
LYMPHS ABS: 1.2 10*3/uL (ref 0.7–4.0)
LYMPHS PCT: 20.9 % (ref 12.0–46.0)
MCHC: 30.7 g/dL (ref 30.0–36.0)
MCV: 78.5 fl (ref 78.0–100.0)
MONOS PCT: 14 % — AB (ref 3.0–12.0)
Monocytes Absolute: 0.8 10*3/uL (ref 0.1–1.0)
NEUTROS ABS: 3.5 10*3/uL (ref 1.4–7.7)
NEUTROS PCT: 61.9 % (ref 43.0–77.0)
PLATELETS: 236 10*3/uL (ref 150.0–400.0)
RBC: 5.06 Mil/uL (ref 3.87–5.11)
RDW: 18.2 % — AB (ref 11.5–15.5)
WBC: 5.6 10*3/uL (ref 4.0–10.5)

## 2016-11-16 LAB — LIPID PANEL
CHOL/HDL RATIO: 3
Cholesterol: 173 mg/dL (ref 0–200)
HDL: 67.6 mg/dL (ref >39.00)
LDL CALC: 83 mg/dL (ref 0–99)
NONHDL: 105.2
TRIGLYCERIDES: 110 mg/dL (ref 0.0–149.0)
VLDL: 22 mg/dL (ref 0.0–40.0)

## 2016-11-16 LAB — HEPATIC FUNCTION PANEL
ALBUMIN: 3.1 g/dL — AB (ref 3.5–5.2)
ALT: 18 U/L (ref 0–35)
AST: 20 U/L (ref 0–37)
Alkaline Phosphatase: 92 U/L (ref 39–117)
Bilirubin, Direct: 0.1 mg/dL (ref 0.0–0.3)
TOTAL PROTEIN: 6.3 g/dL (ref 6.0–8.3)
Total Bilirubin: 0.4 mg/dL (ref 0.2–1.2)

## 2016-11-16 LAB — TSH: TSH: 3.75 u[IU]/mL (ref 0.35–4.50)

## 2016-11-16 MED ORDER — OXYCODONE-ACETAMINOPHEN 5-325 MG PO TABS: 0.5000 | ORAL_TABLET | Freq: Four times a day (QID) | ORAL | 0 refills | Status: DC | PRN

## 2016-11-16 NOTE — Patient Instructions (Signed)
MC well w/[name] 

## 2016-11-16 NOTE — Assessment & Plan Note (Signed)
Percocet so that she could break tab in 1/4 or 1/2's  Potential benefits of a long term oxycodone use as well as potential risks (i.e. addiction risk (low), apnea etc) and complications (i.e. Somnolence, seizures, constipation and others) were explained to the patient and were aknowledged.  

## 2016-11-16 NOTE — Assessment & Plan Note (Addendum)
Chronic and remote of ?etiology Neurol ref

## 2016-11-16 NOTE — Assessment & Plan Note (Signed)
Humalog SS 

## 2016-11-16 NOTE — Assessment & Plan Note (Signed)
s/p a transplant 8/12

## 2016-11-16 NOTE — Progress Notes (Signed)
Subjective:  [name], female    DOB: 12-13-1945  Age: 71 y.o. MRN: [id number]  CC: No chief complaint on file.   HPI Zuriel A Shakespeare presents for HTN, CRF, OA f/u C/o chronic nausea w/meds, hypothyroidism  C/o PVD C/o R arm getting skinnier x 3-4 years Labs w/Nephrology    Outpatient Medications Prior to Visit  Medication Sig Dispense Refill  . Alum & Mag Hydroxide-Simeth (MAGIC MOUTHWASH) SOLN Take 5 mLs by mouth 4 (four) times daily. Swish, hold and swallow 500 mL 1  . amoxicillin (AMOXIL) 500 MG capsule TAKE ALL 4 CAPSULES PRIOR TO PROCEDURE AS NEEDED 4 capsule 1  . dapsone 25 MG tablet Take 25 mg by mouth daily.     . diphenhydrAMINE (BENADRYL) 50 MG capsule Take 1 capsule (50 mg total) by mouth every 6 (six) hours as needed for itching. 90 capsule 3  . gabapentin (NEURONTIN) 100 MG capsule Take 1 capsule (100 mg total) by mouth 3 (three) times daily. 270 capsule 1  . HUMALOG KWIKPEN 100 UNIT/ML KiwkPen IF SUGAR:140-200 USE 8 UNITS,201-230 10 UNITS,231-260 12 UNITS,261-300 14 UNITS,>301 16 UNITS 9 pen 11  . insulin degludec (TRESIBA FLEXTOUCH) 100 UNIT/ML SOPN FlexTouch Pen Inject 0.16 mLs (16 Units total) into the skin daily. 5 pen 1  . Insulin Pen Needle 31G X 5 MM MISC Use to administer insulin four times a day Dx E11.9 130 each 5  . levothyroxine (SYNTHROID, LEVOTHROID) 25 MCG tablet TAKE 2 TABLETS (50 MCG TOTAL) BY MOUTH DAILY. 60 tablet 11  . metoprolol tartrate (LOPRESSOR) 25 MG tablet Take 1 tablet (25 mg total) by mouth 2 (two) times daily. <PLEASE MAKE APPOINTMENT FOR REFILLS> 60 tablet 0  . mycophenolate (MYFORTIC) 180 MG EC tablet Take 360 mg by mouth 2 (two) times daily.     [name] DELICA LANCETS 78G MISC Use to check blood sugars twice a day DX E11.9 100 each 5  . ONETOUCH VERIO test strip USE AS INSTRUCTED 4 TIMES A DAY (BEFORE MEALS) & AT BEDTIME (ICD 10-E11.9) 150 each 3  . oxyCODONE-acetaminophen (ROXICET) 5-325 MG tablet Take 0.5-1 tablets  by mouth every 6 (six) hours as needed for severe pain. 100 tablet 0  . tacrolimus (PROGRAF) 1 MG capsule Take 4 mg by mouth 2 (two) times daily.     Marland Kitchen warfarin (COUMADIN) 5 MG tablet TAKE AS DIRECTED BY COUMADIN CLINIC DISPENSE 40 TABLETS 1 MONTH SUPPLY DISREGARD 30 TABLETS 120 tablet 1  . losartan (COZAAR) 25 MG tablet Take 12.5 mg by mouth at bedtime.  12  . amLODipine (NORVASC) 5 MG tablet TAKE 1/2 (ONE HALF) TABLET BY MOUTH AT BEDTIME (Patient not taking: Reported on 11/16/2016) 30 tablet 6  . Doxepin HCl (SILENOR) 3 MG TABS Take 1 tablet (3 mg total) by mouth at bedtime as needed. (Patient not taking: Reported on 11/16/2016) 30 tablet 5   No facility-administered medications prior to visit.     ROS Review of Systems  Constitutional: Positive for fatigue. Negative for activity change, appetite change, chills and unexpected weight change.  HENT: Negative for congestion, mouth sores and sinus pressure.   Eyes: Negative for visual disturbance.  Respiratory: Negative for cough and chest tightness.   Gastrointestinal: Positive for nausea. Negative for abdominal pain.  Genitourinary: Negative for difficulty urinating, frequency and vaginal pain.  Musculoskeletal: Positive for back pain and gait problem.  Skin: Negative for pallor and rash.  Neurological: Positive for weakness. Negative for dizziness, tremors, numbness  and headaches.  Psychiatric/Behavioral: Negative for confusion and sleep disturbance.    Objective:  BP (!) 142/74 (BP Location: Right Arm, Patient Position: Sitting, Cuff Size: Large)   Pulse (!) 57   Temp 98.5 F (36.9 C) (Oral)   Ht '5\' 1"'$  (1.549 m)   Wt 168 lb 1.3 oz (76.2 kg)   SpO2 98%   BMI 31.76 kg/m   BP Readings from Last 3 Encounters:  11/16/16 (!) 142/74  10/13/16 (!) 196/94  06/29/16 (!) 156/80    Wt Readings from Last 3 Encounters:  11/16/16 168 lb 1.3 oz (76.2 kg)  10/13/16 164 lb (74.4 kg)  06/29/16 175 lb (79.4 kg)    Physical Exam    Constitutional: She appears well-developed. No distress.  HENT:  Head: Normocephalic.  Right Ear: External ear normal.  Left Ear: External ear normal.  Nose: Nose normal.  Mouth/Throat: Oropharynx is clear and moist.  Eyes: Conjunctivae are normal. Pupils are equal, round, and reactive to light. Right eye exhibits no discharge. Left eye exhibits no discharge.  Neck: Normal range of motion. Neck supple. No JVD present. No tracheal deviation present. No thyromegaly present.  Cardiovascular: Normal rate, regular rhythm and normal heart sounds.   Pulmonary/Chest: No stridor. No respiratory distress. She has no wheezes.  Abdominal: Soft. Bowel sounds are normal. She exhibits no distension and no mass. There is no tenderness. There is no rebound and no guarding.  Musculoskeletal: She exhibits tenderness. She exhibits no edema.  Lymphadenopathy:    She has no cervical adenopathy.  Neurological: She displays normal reflexes. No cranial nerve deficit. She exhibits normal muscle tone. Coordination abnormal.  Skin: No rash noted. No erythema.  Psychiatric: She has a normal mood and affect. Her behavior is normal. Judgment and thought content normal.  R arm and forearm is atrophic  Lab Results  Component Value Date   WBC 6.4 01/09/2014   HGB 11.8 (L) 01/09/2014   HCT 37.1 01/09/2014   PLT 213.0 01/09/2014   GLUCOSE 169 (H) 07/02/2016   CHOL 164 04/06/2013   TRIG 125 04/06/2013   HDL 73 04/06/2013   LDLDIRECT 89.3 06/13/2006   LDLCALC 66 04/06/2013   ALT 24 01/09/2014   AST 23 01/09/2014   NA 142 07/02/2016   K 3.5 07/02/2016   CL 106 07/02/2016   CREATININE 1.35 (H) 07/02/2016   BUN 21 07/02/2016   CO2 31 07/02/2016   TSH 2.04 07/02/2016   INR 3.6 11/11/2016   HGBA1C 9.3 06/29/2016   MICROALBUR 317.3 (H) 07/02/2016    US Arterial Seg Multiple  Result Date: 10/13/2016 CLINICAL DATA:  71 year old female with a history of foot pain. Cardiovascular risk factors include smoking  history, known coronary artery disease, hypertension, diabetes. EXAM: NONINVASIVE PHYSIOLOGIC VASCULAR STUDY OF BILATERAL LOWER EXTREMITIES TECHNIQUE: Evaluation of both lower extremities was performed at rest, including calculation of ankle-brachial indices, multiple segmental pressure evaluation, segmental Doppler and segmental pulse volume recording. COMPARISON:  None. FINDINGS: Right: Resting ankle brachial index:  0.47 Segmental blood pressure: Left brachial pressure not acquired. Decrease from the brachial pressure to the right thigh. Right digital pressure measures 60 systolic Doppler: Segmental Doppler of the right lower extremity demonstrates monophasic waveform throughout. Pulse volume recording: Segmental PVR of the right lower extremity demonstrates decrease quality of the waveform. Near flat line at the right great toe. Left: Resting ankle brachial index:  0.71 Segmental blood pressure: Left brachial pressure not measured. Significant drop from the high thigh to low thigh. Left digital  pressure measures 66 systolic Doppler: Segmental Doppler of the left lower extremity demonstrates monophasic waveform throughout Pulse volume recording: Segmental PVR of the left lower extremity demonstrates abnormal quality with decreased amplitude at the ankle. Flat line at the left great toe. Additional: IMPRESSION: Right: Resting ankle-brachial index in the moderate range of arterial occlusive disease. Remainder of the study is suggestive of multilevel arterial occlusive disease, including aortoiliac disease. Left: Resting ankle-brachial index in the mild range of arterial occlusive disease. Remainder of the study suggests multilevel arterial occlusive disease, including aortoiliac disease. Signed, Dulcy Fanny. Earleen Newport, DO [name] Electronically Signed   By: Corrie Mckusick D.O.   On: 10/13/2016 17:55    Assessment & Plan:   There are no diagnoses linked to  this encounter. I have discontinued Ms. Alameda's losartan. I am also having her maintain her mycophenolate, tacrolimus, dapsone, diphenhydrAMINE, magic mouthwash, oxyCODONE-acetaminophen, Doxepin HCl, levothyroxine, warfarin, insulin degludec, amLODipine, HUMALOG KWIKPEN, gabapentin, ONETOUCH DELICA LANCETS 78M, Insulin Pen Needle, amoxicillin, metoprolol tartrate, and ONETOUCH VERIO.  No orders of the defined types were placed in this encounter.    Follow-up: No Follow-up on file.  Walker Kehr, MD

## 2016-11-16 NOTE — Progress Notes (Signed)
Pre visit review using our clinic review tool, if applicable. No additional management support is needed unless otherwise documented below in the visit note. 

## 2016-11-17 ENCOUNTER — Encounter: Payer: Self-pay | Admitting: Neurology

## 2016-11-17 LAB — HEMOGLOBIN A1C: Hgb A1c MFr Bld: 8.5 % — ABNORMAL HIGH (ref 4.6–6.5)

## 2016-11-18 ENCOUNTER — Other Ambulatory Visit (INDEPENDENT_AMBULATORY_CARE_PROVIDER_SITE_OTHER): Payer: Medicare Other

## 2016-11-18 DIAGNOSIS — E1165 Type 2 diabetes mellitus with hyperglycemia: Secondary | ICD-10-CM

## 2016-11-18 DIAGNOSIS — Z794 Long term (current) use of insulin: Secondary | ICD-10-CM | POA: Diagnosis not present

## 2016-11-18 LAB — LIPID PANEL
Cholesterol: 174 mg/dL (ref 0–200)
HDL: 75.1 mg/dL (ref >39.00)
LDL Cholesterol: 72 mg/dL (ref 0–99)
NONHDL: 98.47
Total CHOL/HDL Ratio: 2
Triglycerides: 131 mg/dL (ref 0.0–149.0)
VLDL: 26.2 mg/dL (ref 0.0–40.0)

## 2016-11-18 LAB — HEMOGLOBIN A1C: HEMOGLOBIN A1C: 8.4 % — AB (ref 4.6–6.5)

## 2016-11-18 LAB — COMPREHENSIVE METABOLIC PANEL
ALK PHOS: 85 U/L (ref 39–117)
ALT: 17 U/L (ref 0–35)
AST: 23 U/L (ref 0–37)
Albumin: 3.3 g/dL — ABNORMAL LOW (ref 3.5–5.2)
BUN: 27 mg/dL — ABNORMAL HIGH (ref 6–23)
CO2: 32 mEq/L (ref 19–32)
Calcium: 10.4 mg/dL (ref 8.4–10.5)
Chloride: 104 mEq/L (ref 96–112)
Creatinine, Ser: 1.39 mg/dL — ABNORMAL HIGH (ref 0.40–1.20)
GFR: 47.82 mL/min — AB (ref >60.00)
GLUCOSE: 136 mg/dL — AB (ref 70–99)
POTASSIUM: 3.8 meq/L (ref 3.5–5.1)
Sodium: 141 mEq/L (ref 135–145)
TOTAL PROTEIN: 6.5 g/dL (ref 6.0–8.3)
Total Bilirubin: 0.4 mg/dL (ref 0.2–1.2)

## 2016-11-22 ENCOUNTER — Encounter: Payer: Self-pay | Admitting: Endocrinology

## 2016-11-22 ENCOUNTER — Ambulatory Visit (INDEPENDENT_AMBULATORY_CARE_PROVIDER_SITE_OTHER): Payer: Medicare Other | Admitting: Endocrinology

## 2016-11-22 VITALS — BP 150/90 | HR 65 | Ht 61.0 in | Wt 172.4 lb

## 2016-11-22 DIAGNOSIS — E1165 Type 2 diabetes mellitus with hyperglycemia: Secondary | ICD-10-CM

## 2016-11-22 DIAGNOSIS — Z794 Long term (current) use of insulin: Secondary | ICD-10-CM

## 2016-11-22 MED ORDER — METFORMIN HCL ER 500 MG PO TB24: ORAL_TABLET | ORAL | 3 refills | Status: DC

## 2016-11-22 NOTE — Patient Instructions (Addendum)
Check blood sugars on waking up  4x weekly  Also check blood sugars about 2 hours after a meal and do this after different meals by rotation  Recommended blood sugar levels on waking up is 90-130 and about 2 hours after meal is 130-160  Please bring your blood sugar monitor to each visit, thank you  HUMALOG: Take this only before eating your meals and not at bedtime  Continue 8 units with breakfast and 10 units with dinner However if the blood sugars after eating are going up to more than 180 INCREASE the dose by 2 units for that meal  TRESIBA: Start taking 24 units from now and adjust the dose based on a fasting readings as given on the flow sheet As discussed above the target blood sugar in the morning when you wake up should be under 130 preferably  Avoid eating cereal at breakfast and have more balanced meals with protein

## 2016-11-22 NOTE — Progress Notes (Signed)
[name], female   DOB: January 27, 1946, 71 y.o.   MRN: [id number]            Reason for Appointment: Follow-up for Type 2 Diabetes  Referring physician:[facility]   History of Present Illness:          Date of diagnosis of type 2 diabetes mellitus: 2012       Background history:   She may have been diagnosed with diabetes when she had her kidney transplant in 2012 Apparently was on glipizide initially and she thinks her blood sugars were controlled with this. She could not get Januvia covered by her insurance Her A1c was 6.9 only once in 2014 but subsequently higher  She had been taking Levemir insulin in 2014 but probably for a short time and she does not remember this now  Recent history:   INSULIN regimen is: [place] 16 units; Humalog  8-10 units   at meals   Non-insulin hypoglycemic drugs the patient is taking are: None  Her A1c is now 8.4, previously 9.3  She has not been seen in follow-up since her initial consultation in 12/17  Current management, blood sugar patterns and problems identified:  Start to start [place] as a basal insulin on her last visit and increase the dose to get morning sugars down  However she did not understand instructions and is still taking 16 units  FASTING readings are mostly high and averaging over 200  Blood sugars may be relatively better late evening but occasionally higher AFTER her evening meal also  She is supposed to take Humalog only at mealtimes but take it at bedtime also when the blood sugars are high causing relatively lower readings early morning  No hypoglycemia  Has gained weight recently       Side effects from medications have been:none  Compliance with the medical regimen:  fair Hypoglycemia:never    Glucose monitoring:  done 1-2 times a day         Glucometer: One Touch.      Blood Glucose readings by time of day and averages from meter download:  Mean values apply above for all meters except median  for One Touch  PRE-MEAL Fasting Lunch Dinner Bedtime Overall  Glucose range: 108-345   206-341   1 67-265    Mean/median: 249    227     Self-care: The diet that the patient has been following is: tries to limit drinks with sugar .      she will drink Crystal lite, milk  Meal times are:  Breakfast is at [employer]: Dinner:   Typical meal intake: Breakfast is cereal or bacon/eggs or fruit salad, lunch is sometimes a salad.  Dinner is meat and vegetables at 7pm She will have an sugar once a day, has apple or other fruits for snacks                Dietician visit, most recent: Years ago, probably in classes               Exercise:  some walking, active with taking care of grandchild   Weight history:  Wt Readings from Last 3 Encounters:  11/22/16 172 lb 6.4 oz (78.2 kg)  11/16/16 168 lb 1.3 oz (76.2 kg)  10/13/16 164 lb (74.4 kg)    Glycemic control:11.8 in 8/17   Lab Results  Component Value Date   HGBA1C 8.4 (H) 11/18/2016   HGBA1C 8.5 (H) 11/16/2016   HGBA1C 9.3 06/29/2016  Lab Results  Component Value Date   MICROALBUR 317.3 (H) 07/02/2016   LDLCALC 72 11/18/2016   CREATININE 1.39 (H) 11/18/2016   Lab Results  Component Value Date   MICRALBCREAT 309.4 (H) 07/02/2016       Allergies as of 11/22/2016      Reactions   Ibuprofen Nausea And Vomiting   Sulfamethoxazole-trimethoprim Swelling   Sulfonamide Derivatives Swelling   Tape Rash   Tramadol Nausea And Vomiting   Bactrim Swelling   Hydrocil [psyllium]    n/v   Red Dye Itching, Rash      Medication List       Accurate as of 11/22/16  7:08 PM. Always use your most recent med list.          amLODipine 5 MG tablet Commonly known as:  NORVASC TAKE 1/2 (ONE HALF) TABLET BY MOUTH AT BEDTIME   amoxicillin 500 MG capsule Commonly known as:  AMOXIL TAKE ALL 4 CAPSULES PRIOR TO PROCEDURE AS NEEDED   dapsone 25 MG tablet Take 25 mg by mouth daily.   diphenhydrAMINE 50 MG capsule Commonly known as:   BENADRYL Take 1 capsule (50 mg total) by mouth every 6 (six) hours as needed for itching.   gabapentin 100 MG capsule Commonly known as:  NEURONTIN Take 1 capsule (100 mg total) by mouth 3 (three) times daily.   HUMALOG KWIKPEN 100 UNIT/ML KiwkPen Generic drug:  insulin lispro IF SUGAR:140-200 USE 8 UNITS,201-230 10 UNITS,231-260 12 UNITS,261-300 14 UNITS,>301 16 UNITS   insulin degludec 100 UNIT/ML Sopn FlexTouch Pen Commonly known as:  TRESIBA FLEXTOUCH Inject 0.16 mLs (16 Units total) into the skin daily.   Insulin Pen Needle 31G X 5 MM Misc Use to administer insulin four times a day Dx E11.9   levothyroxine 25 MCG tablet Commonly known as:  SYNTHROID, LEVOTHROID TAKE 2 TABLETS (50 MCG TOTAL) BY MOUTH DAILY.   magic mouthwash Soln Take 5 mLs by mouth 4 (four) times daily. Swish, hold and swallow   metoprolol tartrate 25 MG tablet Commonly known as:  LOPRESSOR Take 1 tablet (25 mg total) by mouth 2 (two) times daily. <PLEASE MAKE APPOINTMENT FOR REFILLS>   mycophenolate 180 MG EC tablet Commonly known as:  MYFORTIC Take 360 mg by mouth 2 (two) times daily.   ONETOUCH DELICA LANCETS 63W Misc Use to check blood sugars twice a day DX E11.9   ONETOUCH VERIO test strip Generic drug:  glucose blood USE AS INSTRUCTED 4 TIMES A DAY (BEFORE MEALS) & AT BEDTIME (ICD 10-E11.9)   oxyCODONE-acetaminophen 5-325 MG tablet Commonly known as:  ROXICET Take 0.5-1 tablets by mouth every 6 (six) hours as needed for severe pain.   tacrolimus 1 MG capsule Commonly known as:  PROGRAF Take 4 mg by mouth 2 (two) times daily.   warfarin 5 MG tablet Commonly known as:  COUMADIN TAKE AS DIRECTED BY COUMADIN CLINIC DISPENSE 40 TABLETS 1 MONTH SUPPLY DISREGARD 30 TABLETS       Allergies:  Allergies  Allergen Reactions  . Ibuprofen Nausea And Vomiting  . Sulfamethoxazole-Trimethoprim Swelling  . Sulfonamide Derivatives Swelling  . Tape Rash  . Tramadol Nausea And Vomiting  . Bactrim  Swelling  . Hydrocil [Psyllium]     n/v  . Red Dye Itching and Rash    Past Medical History:  Diagnosis Date  . Anemia, iron deficiency    of chronic disease  . Aortic stenosis    a. Severe AS by echo 11/2012.  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 Asystole ([place])    a. During ENT surgery 2005: developed marked asystole requiring CPR, felt due to vagal reaction (cath nonobst dz).  . Carotid artery disease ([place])    a. Carotid Dopplers performed in August 2013 showed 40-59% left stenosis and 0-39% right; f/u recommended in 2 years.   . Cerebrovascular accident [facility]) 2009   a. LMCA infarct felt embolic 2458, maintained on chronic coumadin.; denies residual on 04/05/2013  . CHF (congestive heart failure) ([place])   . Cholelithiasis   . Chronic cough onset 03/2010   Dr Melvyn Novas  . Chronic Persistent Atrial Fibrillation 12/31/2008   Qualifier: Diagnosis of  By: [name]    . Coronary artery disease 05/2002   a. Ant MI 2003 s/p PTCA/stent to RCA.   . Diverticulosis of colon   . Esophagitis, reflux   . ESRD (end stage renal disease) ([place])    a. Mass on L kidney per pt s/p nephrectomy - pt states not cancer - [facility] notes indicate ESRD due to HTN/DM - was previously on HD. b. Kidney transplant 02/2011.  Marland Kitchen GERD (gastroesophageal reflux disease)   . Gout   . Helicobacter pylori (H. pylori) infection    hx of  . Hemorrhoids   . Hx of colonic polyps    adenomatous  . Hyperlipidemia   . Hypertension   . Lung nodule seen on imaging study 04/07/2013   1.0 cm ground glass opacity RUL  . Myocardial infarction ([place]) 2003  . Pericardial effusion    a. Small by echo 11/2011.  Marland Kitchen Pruritic dermatitis    treated with steriods/UV light  . S/P aortic valve replacement with bioprosthetic valve and maze procedure 04/12/2013   [facility]   . S/P Maze operation for atrial fibrillation 04/12/2013   Complete bilateral atrial lesion set using  bipolar radiofrequency and cryothermy ablation with clipping of LA appendage  . Sleep apnea    Pt says testing was positive, intolerant of CPAP.  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  . [place]   . Unspecified hearing loss     Past Surgical History:  Procedure Laterality Date  . AORTIC VALVE REPLACEMENT N/A 04/12/2013   Procedure: AORTIC VALVE REPLACEMENT (AVR);  Surgeon: CRexene Alberts MD;  [place] [facility]  Service: Open Heart Surgery;  Laterality: N/A;  . ARTERIOVENOUS GRAFT PLACEMENT Left   . ARTERIOVENOUS GRAFT PLACEMENT Left    "I've had 2 on my left; had one removed" (04/05/2013)   . ARTERY EXPLORATION Right 04/11/2013   Procedure: ARTERY EXPLORATION;  Surgeon: CRexene Alberts MD;  [place] [facility]  Service: Open Heart Surgery;  Laterality: Right;  Right carotid artery exploration  . AV FISTULA PLACEMENT Right   . AV FISTULA REPAIR Right    "took it out" ((/18/2014)  . CARDIOVERSION  05/29/2012   Procedure: CARDIOVERSION;  Surgeon: BLelon Perla MD;  [facility]  Service: Cardiovascular;  Laterality: N/A;  . CHOLECYSTECTOMY  2009   with hernia removal  . CORONARY ANGIOPLASTY WITH STENT PLACEMENT Right    coronary artery  . INSERTION OF DIALYSIS CATHETER Bilateral    "over the years; took them both out" (04/05/2013)  . INTRAOPERATIVE TRANSESOPHAGEAL ECHOCARDIOGRAM N/A 04/11/2013   Procedure: INTRAOPERATIVE TRANSESOPHAGEAL ECHOCARDIOGRAM;  Surgeon:  Rexene Alberts, MD;  Location: [place];  Service: Open Heart Surgery;  Laterality: N/A;  . INTRAOPERATIVE TRANSESOPHAGEAL ECHOCARDIOGRAM N/A 04/12/2013   Procedure: INTRAOPERATIVE TRANSESOPHAGEAL ECHOCARDIOGRAM;  Surgeon: Rexene Alberts, MD;  Location: [place];  Service: Open Heart Surgery;  Laterality: N/A;  . KIDNEY TRANSPLANT   03/16/11  . LEFT AND RIGHT HEART CATHETERIZATION WITH CORONARY ANGIOGRAM N/A 04/06/2013   Procedure: LEFT AND RIGHT HEART CATHETERIZATION WITH CORONARY ANGIOGRAM;  Surgeon: Blane Ohara, MD;  Location: [facility] CATH LAB;  Service: Cardiovascular;  Laterality: N/A;  . MAZE N/A 04/12/2013   Procedure: MAZE;  Surgeon: Rexene Alberts, MD;  Location: [place];  Service: Open Heart Surgery;  Laterality: N/A;  . NASAL RECONSTRUCTION WITH SEPTAL REPAIR     "took it out" (04/05/2013)  . NEPHRECTOMY Left 2010   no CA on bx  . TONSILLECTOMY    . TOTAL ABDOMINAL HYSTERECTOMY    . TUBAL LIGATION      Family History  Problem Relation Age of Onset  . Stroke Father   . Hypertension Mother   . Diabetes Other   . Diabetes Maternal Grandmother   . Diabetes Son     Social History:  reports that she quit smoking about 15 years ago. Her smoking use included Cigarettes. She has a 30.00 pack-year smoking history. She has never used smokeless tobacco. She reports that she does not drink alcohol or use drugs.   Review of Systems   Lipid history:LDL below 100 without a statin    Lab Results  Component Value Date   CHOL 174 11/18/2016   HDL 75.10 11/18/2016   LDLCALC 72 11/18/2016   LDLDIRECT 89.3 06/13/2006   TRIG 131.0 11/18/2016   CHOLHDL 2 11/18/2016           Hypertension: She has had hypertension for several years, currently taking only 25 mg losartan, and 25 mg metoprolol  Most recent eye exam was 10/17  Most recent foot exam:12/17   Pains in feet present, has been prescribed gabapentin by podiatrist  HYPOTHYROIDISM: She does not know what symptoms she had at onset, appears to have had hypothyroidism since 2014 Currently taking 50 g levothyroxine, followed by PCP   Lab Results  Component Value Date   TSH 3.75 11/16/2016   TSH 2.04 07/02/2016   TSH 5.47 (H) 01/09/2014   FREET4 0.96 07/02/2016     LABS:  Lab on 11/18/2016  Component Date Value Ref Range Status  . Hgb A1c MFr Bld  11/18/2016 8.4* 4.6 - 6.5 % Final  . Sodium 11/18/2016 141  135 - 145 mEq/L Final  . Potassium 11/18/2016 3.8  3.5 - 5.1 mEq/L Final  . Chloride 11/18/2016 104  96 - 112 mEq/L Final  . CO2 11/18/2016 32  19 - 32 mEq/L Final  . Glucose, Bld 11/18/2016 136* 70 - 99 mg/dL Final  . BUN 11/18/2016 27* 6 - 23 mg/dL Final  . Creatinine, Ser 11/18/2016 1.39* 0.40 - 1.20 mg/dL Final  . Total Bilirubin 11/18/2016 0.4  0.2 - 1.2 mg/dL Final  . Alkaline Phosphatase 11/18/2016 85  39 - 117 U/L Final  . AST 11/18/2016 23  0 - 37 U/L Final  . ALT 11/18/2016 17  0 - 35 U/L Final  . Total Protein 11/18/2016 6.5  6.0 - 8.3 g/dL Final  . Albumin 11/18/2016 3.3* 3.5 - 5.2 g/dL Final  . Calcium 11/18/2016 10.4  8.4 - 10.5 mg/dL Final  . GFR 11/18/2016 47.82* >60.00 mL/min Final  .  Cholesterol 11/18/2016 174  0 - 200 mg/dL Final  . Triglycerides 11/18/2016 131.0  0.0 - 149.0 mg/dL Final  . HDL 11/18/2016 75.10  >39.00 mg/dL Final  . VLDL 11/18/2016 26.2  0.0 - 40.0 mg/dL Final  . LDL Cholesterol 11/18/2016 72  0 - 99 mg/dL Final  . Total CHOL/HDL Ratio 11/18/2016 2   Final  . NonHDL 11/18/2016 98.47   Final  Appointment on 11/16/2016  Component Date Value Ref Range Status  . Hgb A1c MFr Bld 11/16/2016 8.5* 4.6 - 6.5 % Final  . Total Bilirubin 11/16/2016 0.4  0.2 - 1.2 mg/dL Final  . Bilirubin, Direct 11/16/2016 0.1  0.0 - 0.3 mg/dL Final  . Alkaline Phosphatase 11/16/2016 92  39 - 117 U/L Final  . AST 11/16/2016 20  0 - 37 U/L Final  . ALT 11/16/2016 18  0 - 35 U/L Final  . Total Protein 11/16/2016 6.3  6.0 - 8.3 g/dL Final  . Albumin 11/16/2016 3.1* 3.5 - 5.2 g/dL Final  . Sodium 11/16/2016 141  135 - 145 mEq/L Final  . Potassium 11/16/2016 4.1  3.5 - 5.1 mEq/L Final  . Chloride 11/16/2016 105  96 - 112 mEq/L Final  . CO2 11/16/2016 31  19 - 32 mEq/L Final  . Glucose, Bld 11/16/2016 142* 70 - 99 mg/dL Final  . BUN 11/16/2016 27* 6 - 23 mg/dL Final  . Creatinine, Ser 11/16/2016 1.49* 0.40 - 1.20  mg/dL Final  . Calcium 11/16/2016 10.0  8.4 - 10.5 mg/dL Final  . GFR 11/16/2016 44.13* >60.00 mL/min Final  . WBC 11/16/2016 5.6  4.0 - 10.5 K/uL Final  . RBC 11/16/2016 5.06  3.87 - 5.11 Mil/uL Final  . Hemoglobin 11/16/2016 12.2  12.0 - 15.0 g/dL Final  . HCT 11/16/2016 39.7  36.0 - 46.0 % Final  . MCV 11/16/2016 78.5  78.0 - 100.0 fl Final  . MCHC 11/16/2016 30.7  30.0 - 36.0 g/dL Final  . RDW 11/16/2016 18.2* 11.5 - 15.5 % Final  . Platelets 11/16/2016 236.0  150.0 - 400.0 K/uL Final  . Neutrophils Relative % 11/16/2016 61.9  43.0 - 77.0 % Final  . Lymphocytes Relative 11/16/2016 20.9  12.0 - 46.0 % Final  . Monocytes Relative 11/16/2016 14.0* 3.0 - 12.0 % Final  . Eosinophils Relative 11/16/2016 2.4  0.0 - 5.0 % Final  . Basophils Relative 11/16/2016 0.8  0.0 - 3.0 % Final  . Neutro Abs 11/16/2016 3.5  1.4 - 7.7 K/uL Final  . Lymphs Abs 11/16/2016 1.2  0.7 - 4.0 K/uL Final  . Monocytes Absolute 11/16/2016 0.8  0.1 - 1.0 K/uL Final  . Eosinophils Absolute 11/16/2016 0.1  0.0 - 0.7 K/uL Final  . Basophils Absolute 11/16/2016 0.0  0.0 - 0.1 K/uL Final  . TSH 11/16/2016 3.75  0.35 - 4.50 uIU/mL Final  . Cholesterol 11/16/2016 173  0 - 200 mg/dL Final  . Triglycerides 11/16/2016 110.0  0.0 - 149.0 mg/dL Final  . HDL 11/16/2016 67.60  >39.00 mg/dL Final  . VLDL 11/16/2016 22.0  0.0 - 40.0 mg/dL Final  . LDL Cholesterol 11/16/2016 83  0 - 99 mg/dL Final  . Total CHOL/HDL Ratio 11/16/2016 3   Final  . NonHDL 11/16/2016 105.20   Final    Physical Examination:  BP (!) 150/90   Pulse 65   Ht '5\' 1"'$  (1.549 m)   Wt 172 lb 6.4 oz (78.2 kg)   SpO2 94%   BMI 32.57 kg/m  ASSESSMENT:  Diabetes type 2, uncontrolled with BMI 32  See history of present illness for detailed discussion of current diabetes management, blood sugar patterns and problems identified  She has not been seen in follow-up for several months after starting low-dose basal insulin As discussed above she is not  getting enough basal insulin with 16 units of Tresiba and she does not understand how to adjust this Blood sugars may be relatively higher after meals also but does not check enough readings Also taking Humalog inappropriately at bedtime which however may be bringing her blood sugar down in the mornings at times She does need more diabetes education A1c is 8.4 currently   PLAN:     Start titrating basal insulin.   She can start with 24 units at bedtime of Tresiba.  Given flowsheet that she can use at home to adjust the dose every 3 days by 2 units to achieve a target of 130 fasting glucose, discussed specifics of this in detail  She will take for now the same doses of Humalog at mealtimes   Skip the dose at bedtime  Check renal function today  He is a candidate for at least starting metformin since her renal function is adequate, explained to her that metformin is safe in the long run especially with stable renal function  Discussed day-to-day management including meal planning, need for weight loss, balanced meals and insulin sensitizers  Will need to adjust her Humalog doses based on the post prandial readings done more 2-3 hours after meals rather than at bedtime  Recommend further diabetes education and follow-up with nurse educator/dietitian  Patient Instructions  Check blood sugars on waking up  4x weekly  Also check blood sugars about 2 hours after a meal and do this after different meals by rotation  Recommended blood sugar levels on waking up is 90-130 and about 2 hours after meal is 130-160  Please bring your blood sugar monitor to each visit, thank you  HUMALOG: Take this only before eating your meals and not at bedtime  Continue 8 units with breakfast and 10 units with dinner However if the blood sugars after eating are going up to more than 180 INCREASE the dose by 2 units for that meal  TRESIBA: Start taking 24 units from now and adjust the dose based on a  fasting readings as given on the flow sheet As discussed above the target blood sugar in the morning when you wake up should be under 130 preferably  Avoid eating cereal at breakfast and have more balanced meals with protein  Counseling time on subjects discussed above is over 50% of today's 25 minute visit  [place] 11/22/2016, 7:08 PM   Note: This office note was prepared with Dragon voice recognition system technology. Any transcriptional errors that result from this process are unintentional.

## 2016-11-22 NOTE — Progress Notes (Signed)
Please call to let patient know that the metformin prescription has been sent as discussed, to take one tablet with dinner for the first week, 2 tablets in the second week and 3 tablets with food in the third week as long as she has no loose stools.  Kidney function is adequate for starting this

## 2016-11-29 ENCOUNTER — Other Ambulatory Visit: Payer: Self-pay | Admitting: Cardiology

## 2016-11-29 ENCOUNTER — Ambulatory Visit (INDEPENDENT_AMBULATORY_CARE_PROVIDER_SITE_OTHER): Payer: Medicare Other | Admitting: Pharmacist Clinician (PhC)/ Clinical Pharmacy Specialist

## 2016-11-29 DIAGNOSIS — Z5181 Encounter for therapeutic drug level monitoring: Secondary | ICD-10-CM | POA: Diagnosis not present

## 2016-11-29 DIAGNOSIS — I4891 Unspecified atrial fibrillation: Secondary | ICD-10-CM | POA: Diagnosis not present

## 2016-11-29 LAB — POCT INR: INR: 1.6

## 2016-11-29 NOTE — Telephone Encounter (Signed)
Rx(s) sent to pharmacy electronically.  

## 2016-12-14 ENCOUNTER — Other Ambulatory Visit: Payer: Self-pay | Admitting: Cardiology

## 2016-12-14 ENCOUNTER — Ambulatory Visit (INDEPENDENT_AMBULATORY_CARE_PROVIDER_SITE_OTHER): Payer: Medicare Other | Admitting: Pharmacist

## 2016-12-14 ENCOUNTER — Telehealth: Payer: Self-pay | Admitting: Podiatry

## 2016-12-14 DIAGNOSIS — Z5181 Encounter for therapeutic drug level monitoring: Secondary | ICD-10-CM | POA: Diagnosis not present

## 2016-12-14 DIAGNOSIS — I4891 Unspecified atrial fibrillation: Secondary | ICD-10-CM | POA: Diagnosis not present

## 2016-12-14 LAB — POCT INR: INR: 1.9

## 2016-12-14 NOTE — Telephone Encounter (Signed)
Pt calling to check status of diabetic shoes, seen Dr R in march and has not heard anything

## 2016-12-15 ENCOUNTER — Other Ambulatory Visit: Payer: Self-pay | Admitting: Cardiology

## 2016-12-15 ENCOUNTER — Other Ambulatory Visit: Payer: Self-pay | Admitting: Internal Medicine

## 2016-12-15 NOTE — Telephone Encounter (Signed)
Rx has been sent to the pharmacy electronically. ° °

## 2016-12-23 ENCOUNTER — Other Ambulatory Visit: Payer: Medicare Other

## 2016-12-23 ENCOUNTER — Encounter: Payer: Medicare Other | Admitting: Dietician

## 2016-12-27 ENCOUNTER — Encounter (HOSPITAL_COMMUNITY): Payer: Self-pay | Admitting: Emergency Medicine

## 2016-12-27 ENCOUNTER — Inpatient Hospital Stay (HOSPITAL_COMMUNITY)
Admission: EM | Admit: 2016-12-27 | Discharge: 2017-01-01 | DRG: 291 | Disposition: A | Discharge status: Home / Self Care | Payer: Medicare Other | Attending: Internal Medicine | Admitting: Internal Medicine

## 2016-12-27 ENCOUNTER — Ambulatory Visit: Payer: Medicare Other | Admitting: Endocrinology

## 2016-12-27 ENCOUNTER — Emergency Department (HOSPITAL_COMMUNITY): Payer: Medicare Other

## 2016-12-27 ENCOUNTER — Emergency Department (HOSPITAL_COMMUNITY): Admit: 2016-12-27 | Discharge: 2016-12-27 | Disposition: A | Discharge status: Home / Self Care | Payer: Medicare Other

## 2016-12-27 DIAGNOSIS — H9190 Unspecified hearing loss, unspecified ear: Secondary | ICD-10-CM | POA: Diagnosis present

## 2016-12-27 DIAGNOSIS — R05 Cough: Secondary | ICD-10-CM | POA: Diagnosis not present

## 2016-12-27 DIAGNOSIS — E876 Hypokalemia: Secondary | ICD-10-CM | POA: Diagnosis present

## 2016-12-27 DIAGNOSIS — N644 Mastodynia: Secondary | ICD-10-CM | POA: Diagnosis not present

## 2016-12-27 DIAGNOSIS — I340 Nonrheumatic mitral (valve) insufficiency: Secondary | ICD-10-CM | POA: Diagnosis not present

## 2016-12-27 DIAGNOSIS — I132 Hypertensive heart and chronic kidney disease with heart failure and with stage 5 chronic kidney disease, or end stage renal disease: Secondary | ICD-10-CM | POA: Diagnosis present

## 2016-12-27 DIAGNOSIS — G4733 Obstructive sleep apnea (adult) (pediatric): Secondary | ICD-10-CM | POA: Diagnosis present

## 2016-12-27 DIAGNOSIS — R079 Chest pain, unspecified: Secondary | ICD-10-CM | POA: Diagnosis not present

## 2016-12-27 DIAGNOSIS — Z955 Presence of coronary angioplasty implant and graft: Secondary | ICD-10-CM

## 2016-12-27 DIAGNOSIS — R2232 Localized swelling, mass and lump, left upper limb: Secondary | ICD-10-CM | POA: Diagnosis present

## 2016-12-27 DIAGNOSIS — Z7901 Long term (current) use of anticoagulants: Secondary | ICD-10-CM | POA: Diagnosis not present

## 2016-12-27 DIAGNOSIS — T380X5A Adverse effect of glucocorticoids and synthetic analogues, initial encounter: Secondary | ICD-10-CM | POA: Diagnosis present

## 2016-12-27 DIAGNOSIS — Z953 Presence of xenogenic heart valve: Secondary | ICD-10-CM | POA: Diagnosis not present

## 2016-12-27 DIAGNOSIS — E039 Hypothyroidism, unspecified: Secondary | ICD-10-CM | POA: Diagnosis present

## 2016-12-27 DIAGNOSIS — I482 Chronic atrial fibrillation: Secondary | ICD-10-CM

## 2016-12-27 DIAGNOSIS — Z90710 Acquired absence of both cervix and uterus: Secondary | ICD-10-CM | POA: Diagnosis not present

## 2016-12-27 DIAGNOSIS — I509 Heart failure, unspecified: Secondary | ICD-10-CM

## 2016-12-27 DIAGNOSIS — I252 Old myocardial infarction: Secondary | ICD-10-CM | POA: Diagnosis not present

## 2016-12-27 DIAGNOSIS — N186 End stage renal disease: Secondary | ICD-10-CM | POA: Diagnosis present

## 2016-12-27 DIAGNOSIS — I5033 Acute on chronic diastolic (congestive) heart failure: Secondary | ICD-10-CM | POA: Diagnosis not present

## 2016-12-27 DIAGNOSIS — J9601 Acute respiratory failure with hypoxia: Secondary | ICD-10-CM | POA: Diagnosis present

## 2016-12-27 DIAGNOSIS — I2510 Atherosclerotic heart disease of native coronary artery without angina pectoris: Secondary | ICD-10-CM | POA: Diagnosis present

## 2016-12-27 DIAGNOSIS — M7989 Other specified soft tissue disorders: Secondary | ICD-10-CM | POA: Diagnosis not present

## 2016-12-27 DIAGNOSIS — Z882 Allergy status to sulfonamides status: Secondary | ICD-10-CM

## 2016-12-27 DIAGNOSIS — J9611 Chronic respiratory failure with hypoxia: Secondary | ICD-10-CM | POA: Diagnosis present

## 2016-12-27 DIAGNOSIS — Z7984 Long term (current) use of oral hypoglycemic drugs: Secondary | ICD-10-CM

## 2016-12-27 DIAGNOSIS — I4891 Unspecified atrial fibrillation: Secondary | ICD-10-CM

## 2016-12-27 DIAGNOSIS — Z8679 Personal history of other diseases of the circulatory system: Secondary | ICD-10-CM

## 2016-12-27 DIAGNOSIS — I160 Hypertensive urgency: Secondary | ICD-10-CM | POA: Diagnosis present

## 2016-12-27 DIAGNOSIS — Z87891 Personal history of nicotine dependence: Secondary | ICD-10-CM

## 2016-12-27 DIAGNOSIS — I480 Paroxysmal atrial fibrillation: Secondary | ICD-10-CM | POA: Diagnosis present

## 2016-12-27 DIAGNOSIS — K219 Gastro-esophageal reflux disease without esophagitis: Secondary | ICD-10-CM | POA: Diagnosis present

## 2016-12-27 DIAGNOSIS — J9621 Acute and chronic respiratory failure with hypoxia: Secondary | ICD-10-CM

## 2016-12-27 DIAGNOSIS — Z794 Long term (current) use of insulin: Secondary | ICD-10-CM | POA: Diagnosis not present

## 2016-12-27 DIAGNOSIS — M109 Gout, unspecified: Secondary | ICD-10-CM | POA: Diagnosis not present

## 2016-12-27 DIAGNOSIS — Z952 Presence of prosthetic heart valve: Secondary | ICD-10-CM | POA: Diagnosis not present

## 2016-12-27 DIAGNOSIS — I69920 Aphasia following unspecified cerebrovascular disease: Secondary | ICD-10-CM | POA: Diagnosis not present

## 2016-12-27 DIAGNOSIS — Z881 Allergy status to other antibiotic agents status: Secondary | ICD-10-CM

## 2016-12-27 DIAGNOSIS — N183 Chronic kidney disease, stage 3 unspecified: Secondary | ICD-10-CM | POA: Diagnosis present

## 2016-12-27 DIAGNOSIS — E1122 Type 2 diabetes mellitus with diabetic chronic kidney disease: Secondary | ICD-10-CM | POA: Diagnosis present

## 2016-12-27 DIAGNOSIS — E1121 Type 2 diabetes mellitus with diabetic nephropathy: Secondary | ICD-10-CM | POA: Diagnosis not present

## 2016-12-27 DIAGNOSIS — Z940 Kidney transplant status: Secondary | ICD-10-CM | POA: Diagnosis not present

## 2016-12-27 DIAGNOSIS — E785 Hyperlipidemia, unspecified: Secondary | ICD-10-CM | POA: Diagnosis present

## 2016-12-27 DIAGNOSIS — Z91048 Other nonmedicinal substance allergy status: Secondary | ICD-10-CM

## 2016-12-27 DIAGNOSIS — N63 Unspecified lump in unspecified breast: Secondary | ICD-10-CM | POA: Diagnosis not present

## 2016-12-27 DIAGNOSIS — I481 Persistent atrial fibrillation: Secondary | ICD-10-CM | POA: Diagnosis not present

## 2016-12-27 DIAGNOSIS — E1165 Type 2 diabetes mellitus with hyperglycemia: Secondary | ICD-10-CM | POA: Diagnosis present

## 2016-12-27 DIAGNOSIS — I10 Essential (primary) hypertension: Secondary | ICD-10-CM | POA: Diagnosis not present

## 2016-12-27 DIAGNOSIS — Z888 Allergy status to other drugs, medicaments and biological substances status: Secondary | ICD-10-CM

## 2016-12-27 DIAGNOSIS — E0865 Diabetes mellitus due to underlying condition with hyperglycemia: Secondary | ICD-10-CM

## 2016-12-27 DIAGNOSIS — Z9889 Other specified postprocedural states: Secondary | ICD-10-CM | POA: Diagnosis not present

## 2016-12-27 DIAGNOSIS — Z79899 Other long term (current) drug therapy: Secondary | ICD-10-CM

## 2016-12-27 DIAGNOSIS — E1129 Type 2 diabetes mellitus with other diabetic kidney complication: Secondary | ICD-10-CM | POA: Diagnosis present

## 2016-12-27 LAB — BASIC METABOLIC PANEL
Anion gap: 7 (ref 5–15)
BUN: 18 mg/dL (ref 6–20)
CALCIUM: 9.8 mg/dL (ref 8.9–10.3)
CHLORIDE: 101 mmol/L (ref 101–111)
CO2: 30 mmol/L (ref 22–32)
CREATININE: 1.22 mg/dL — AB (ref 0.44–1.00)
GFR calc non Af Amer: 43 mL/min — ABNORMAL LOW (ref >60)
GFR, EST AFRICAN AMERICAN: 50 mL/min — AB (ref >60)
GLUCOSE: 224 mg/dL — AB (ref 65–99)
Potassium: 3.7 mmol/L (ref 3.5–5.1)
Sodium: 138 mmol/L (ref 135–145)

## 2016-12-27 LAB — CBC
HCT: 39.5 % (ref 36.0–46.0)
HEMOGLOBIN: 12 g/dL (ref 12.0–15.0)
MCH: 24.1 pg — AB (ref 26.0–34.0)
MCHC: 30.4 g/dL (ref 30.0–36.0)
MCV: 79.5 fL (ref 78.0–100.0)
Platelets: 274 10*3/uL (ref 150–400)
RBC: 4.97 MIL/uL (ref 3.87–5.11)
RDW: 17.7 % — ABNORMAL HIGH (ref 11.5–15.5)
WBC: 10.5 10*3/uL (ref 4.0–10.5)

## 2016-12-27 LAB — BRAIN NATRIURETIC PEPTIDE: B Natriuretic Peptide: 1653.2 pg/mL — ABNORMAL HIGH (ref 0.0–100.0)

## 2016-12-27 LAB — I-STAT TROPONIN, ED: Troponin i, poc: 0.05 ng/mL (ref 0.00–0.08)

## 2016-12-27 LAB — PROTIME-INR
INR: 2.29
PROTHROMBIN TIME: 25.7 s — AB (ref 11.4–15.2)

## 2016-12-27 LAB — D-DIMER, QUANTITATIVE (NOT AT ARMC): D DIMER QUANT: 0.45 ug{FEU}/mL (ref 0.00–0.50)

## 2016-12-27 MED ORDER — SODIUM CHLORIDE 0.9% FLUSH
3.0000 mL | Freq: Two times a day (BID) | INTRAVENOUS | Status: DC
  Administered 2016-12-28 – 2017-01-01 (×10): 3 mL via INTRAVENOUS

## 2016-12-27 MED ORDER — FUROSEMIDE 10 MG/ML IJ SOLN
80.0000 mg | Freq: Once | INTRAMUSCULAR | Status: AC
  Administered 2016-12-27: 80 mg via INTRAVENOUS
  Filled 2016-12-27: qty 8

## 2016-12-27 MED ORDER — DIPHENHYDRAMINE HCL 25 MG PO CAPS: 50.0000 mg | ORAL_CAPSULE | Freq: Four times a day (QID) | ORAL | Status: DC | PRN

## 2016-12-27 MED ORDER — ADULT MULTIVITAMIN W/MINERALS CH
1.0000 | ORAL_TABLET | Freq: Every day | ORAL | Status: DC
  Administered 2016-12-28 – 2017-01-01 (×5): 1 via ORAL
  Filled 2016-12-27 (×5): qty 1

## 2016-12-27 MED ORDER — WARFARIN - PHARMACIST DOSING INPATIENT
Freq: Every day | Status: DC
  Administered 2016-12-28 – 2016-12-30 (×2)

## 2016-12-27 MED ORDER — INSULIN ASPART 100 UNIT/ML ~~LOC~~ SOLN
0.0000 [IU] | Freq: Three times a day (TID) | SUBCUTANEOUS | Status: DC
  Administered 2016-12-28 (×2): 3 [IU] via SUBCUTANEOUS
  Administered 2016-12-29: 5 [IU] via SUBCUTANEOUS

## 2016-12-27 MED ORDER — MAGIC MOUTHWASH: 5.0000 mL | Freq: Four times a day (QID) | ORAL | Status: DC

## 2016-12-27 MED ORDER — LABETALOL HCL 5 MG/ML IV SOLN: 20.0000 mg | Freq: Once | INTRAVENOUS | Status: DC

## 2016-12-27 MED ORDER — AMLODIPINE BESYLATE 2.5 MG PO TABS: 2.5000 mg | ORAL_TABLET | Freq: Every day | ORAL | Status: DC

## 2016-12-27 MED ORDER — DAPSONE 25 MG PO TABS
25.0000 mg | ORAL_TABLET | Freq: Every day | ORAL | Status: DC
  Administered 2016-12-28 – 2017-01-01 (×5): 25 mg via ORAL
  Filled 2016-12-27 (×5): qty 1

## 2016-12-27 MED ORDER — HYDRALAZINE HCL 20 MG/ML IJ SOLN
5.0000 mg | INTRAMUSCULAR | Status: DC | PRN
  Administered 2016-12-28: 5 mg via INTRAVENOUS
  Filled 2016-12-27: qty 1

## 2016-12-27 MED ORDER — WARFARIN SODIUM 7.5 MG PO TABS
7.5000 mg | ORAL_TABLET | Freq: Once | ORAL | Status: AC
  Administered 2016-12-27: 7.5 mg via ORAL
  Filled 2016-12-27: qty 1

## 2016-12-27 MED ORDER — SODIUM CHLORIDE 0.9 % IV SOLN: 250.0000 mL | INTRAVENOUS | Status: DC | PRN

## 2016-12-27 MED ORDER — INSULIN ASPART 100 UNIT/ML ~~LOC~~ SOLN
7.0000 [IU] | Freq: Once | SUBCUTANEOUS | Status: AC
  Administered 2016-12-27: 7 [IU] via SUBCUTANEOUS

## 2016-12-27 MED ORDER — LEVOTHYROXINE SODIUM 50 MCG PO TABS
50.0000 ug | ORAL_TABLET | Freq: Every day | ORAL | Status: DC
  Administered 2016-12-28 – 2017-01-01 (×5): 50 ug via ORAL
  Filled 2016-12-27 (×5): qty 1

## 2016-12-27 MED ORDER — OXYCODONE-ACETAMINOPHEN 5-325 MG PO TABS: 0.5000 | ORAL_TABLET | Freq: Four times a day (QID) | ORAL | Status: DC | PRN

## 2016-12-27 MED ORDER — DM-GUAIFENESIN ER 30-600 MG PO TB12
1.0000 | ORAL_TABLET | Freq: Two times a day (BID) | ORAL | Status: DC | PRN
  Administered 2016-12-27 – 2016-12-30 (×5): 1 via ORAL
  Filled 2016-12-27 (×6): qty 1

## 2016-12-27 MED ORDER — POLYETHYLENE GLYCOL 3350 17 G PO PACK: 17.0000 g | PACK | Freq: Every day | ORAL | Status: DC | PRN

## 2016-12-27 MED ORDER — SODIUM CHLORIDE 0.9% FLUSH: 3.0000 mL | INTRAVENOUS | Status: DC | PRN

## 2016-12-27 MED ORDER — FUROSEMIDE 10 MG/ML IJ SOLN
60.0000 mg | Freq: Two times a day (BID) | INTRAMUSCULAR | Status: DC
Start: 2016-12-28 — End: 2017-01-01
  Administered 2016-12-28 – 2017-01-01 (×9): 60 mg via INTRAVENOUS
  Filled 2016-12-27 (×10): qty 6

## 2016-12-27 MED ORDER — ACETAMINOPHEN 325 MG PO TABS
650.0000 mg | ORAL_TABLET | ORAL | Status: DC | PRN
  Administered 2016-12-30: 650 mg via ORAL
  Filled 2016-12-27 (×2): qty 2

## 2016-12-27 MED ORDER — GABAPENTIN 100 MG PO CAPS
100.0000 mg | ORAL_CAPSULE | Freq: Two times a day (BID) | ORAL | Status: DC
  Administered 2016-12-27 – 2017-01-01 (×10): 100 mg via ORAL
  Filled 2016-12-27 (×10): qty 1

## 2016-12-27 MED ORDER — ONDANSETRON HCL 4 MG/2ML IJ SOLN: 4.0000 mg | Freq: Four times a day (QID) | INTRAMUSCULAR | Status: DC | PRN

## 2016-12-27 MED ORDER — METOPROLOL TARTRATE 25 MG PO TABS
25.0000 mg | ORAL_TABLET | Freq: Two times a day (BID) | ORAL | Status: DC
  Administered 2016-12-28 – 2017-01-01 (×9): 25 mg via ORAL
  Filled 2016-12-27 (×9): qty 1

## 2016-12-27 MED ORDER — ORAL CARE MOUTH RINSE
15.0000 mL | Freq: Two times a day (BID) | OROMUCOSAL | Status: DC
  Administered 2016-12-28 – 2017-01-01 (×8): 15 mL via OROMUCOSAL

## 2016-12-27 MED ORDER — INSULIN GLARGINE 100 UNIT/ML ~~LOC~~ SOLN
5.0000 [IU] | Freq: Every day | SUBCUTANEOUS | Status: DC
  Administered 2016-12-28 – 2016-12-29 (×3): 5 [IU] via SUBCUTANEOUS
  Filled 2016-12-27 (×3): qty 0.05

## 2016-12-27 MED ORDER — TACROLIMUS 1 MG PO CAPS
4.0000 mg | ORAL_CAPSULE | Freq: Two times a day (BID) | ORAL | Status: DC
  Administered 2016-12-27 – 2017-01-01 (×10): 4 mg via ORAL
  Filled 2016-12-27 (×10): qty 4

## 2016-12-27 MED ORDER — MYCOPHENOLATE SODIUM 180 MG PO TBEC
360.0000 mg | DELAYED_RELEASE_TABLET | Freq: Two times a day (BID) | ORAL | Status: DC
  Administered 2016-12-27 – 2017-01-01 (×10): 360 mg via ORAL
  Filled 2016-12-27 (×10): qty 2

## 2016-12-27 MED ORDER — LABETALOL HCL 5 MG/ML IV SOLN
20.0000 mg | Freq: Once | INTRAVENOUS | Status: AC
  Administered 2016-12-27: 20 mg via INTRAVENOUS
  Filled 2016-12-27: qty 4

## 2016-12-27 NOTE — Progress Notes (Signed)
ANTICOAGULATION CONSULT NOTE - Initial Consult  Pharmacy Consult for Warfarin Indication: atrial fibrillation  Allergies  Allergen Reactions  . Ibuprofen Nausea And Vomiting  . Sulfamethoxazole-Trimethoprim Itching, Swelling and Rash    Swelling of the face  . Sulfonamide Derivatives Itching, Swelling and Rash    Swelling of the face  . Tape Rash  . Tramadol Nausea And Vomiting  . Hydrocil [Psyllium] Nausea And Vomiting  . Bactrim Itching, Swelling and Rash  . Red Dye Itching and Rash    Patient Measurements: Height: 5\' 2"  (157.5 cm) Weight: 172 lb (78 kg) IBW/kg (Calculated) : 50.1  Vital Signs: Temp: 98.4 F (36.9 C) (06/11 1525) Temp Source: Oral (06/11 1525) BP: 123/49 (06/11 2130) Pulse Rate: 64 (06/11 2130)  Labs:  Recent Labs  12/27/16 1551  HGB 12.0  HCT 39.5  PLT 274  LABPROT 25.7*  INR 2.29  CREATININE 1.22*    Estimated Creatinine Clearance: 40.3 mL/min (A) (by C-G formula based on SCr of 1.22 mg/dL (H)).   Medical History: Past Medical History:  Diagnosis Date  . Anemia, iron deficiency    of chronic disease  . Aortic stenosis    a. Severe AS by echo 11/2012.  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 Asystole ([place])    a. During ENT surgery 2005: developed marked asystole requiring CPR, felt due to vagal reaction (cath nonobst dz).  . Carotid artery disease ([place])    a. Carotid Dopplers performed in August 2013 showed 40-59% left stenosis and 0-39% right; f/u recommended in 2 years.   . Cerebrovascular accident [facility]) 2009   a. LMCA infarct felt embolic 4580, maintained on chronic coumadin.; denies residual on 04/05/2013  . CHF (congestive heart failure) ([place])   . Cholelithiasis   . Chronic cough onset 03/2010   Dr Melvyn Novas  . Chronic Persistent Atrial Fibrillation 12/31/2008   Qualifier: Diagnosis of  By: [name]    . Coronary artery disease 05/2002   a. Ant MI 2003 s/p PTCA/stent to RCA.   . Diverticulosis of colon    . Esophagitis, reflux   . ESRD (end stage renal disease) ([place])    a. Mass on L kidney per pt s/p nephrectomy - pt states not cancer - [facility] notes indicate ESRD due to HTN/DM - was previously on HD. b. Kidney transplant 02/2011.  Marland Kitchen GERD (gastroesophageal reflux disease)   . Gout   . Helicobacter pylori (H. pylori) infection    hx of  . Hemorrhoids   . Hx of colonic polyps    adenomatous  . Hyperlipidemia   . Hypertension   . Lung nodule seen on imaging study 04/07/2013   1.0 cm ground glass opacity RUL  . Myocardial infarction ([place]) 2003  . Pericardial effusion    a. Small by echo 11/2011.  Marland Kitchen Pruritic dermatitis    treated with steriods/UV light  . S/P aortic valve replacement with bioprosthetic valve and maze procedure 04/12/2013   49mm [name] Ease bovine pericardial tissue valve   . S/P Maze operation for atrial fibrillation 04/12/2013   Complete bilateral atrial lesion set using bipolar radiofrequency and cryothermy ablation with clipping of LA appendage  . Sleep apnea    Pt says testing was positive, intolerant of CPAP.  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  . Type II diabetes mellitus ([place])   .  Unspecified hearing loss     Medications:   (Not in a hospital admission) Scheduled:  . amLODipine  2.5 mg Oral Daily  . [START ON 12/28/2016] CENTRUM SILVER 50+WOMEN  1 tablet Oral Q breakfast  . dapsone  25 mg Oral Daily  . [START ON 12/28/2016] furosemide  60 mg Intravenous Q12H  . gabapentin  100 mg Oral BID  . [START ON 12/28/2016] insulin aspart  0-9 Units Subcutaneous TID WC  . insulin degludec  5 Units Subcutaneous Daily  . levothyroxine  50 mcg Oral Daily  . magic mouthwash  5 mL Oral QID  . [START ON 12/28/2016] metoprolol tartrate  25 mg Oral BID  . mycophenolate  360 mg Oral BID  . sodium chloride flush   3 mL Intravenous Q12H  . tacrolimus  4 mg Oral BID   Infusions:  . sodium chloride      Assessment: 71yo female with history of afib on warfarin, CVA, CAD, and renal transplant in 2012 presents with CP, SOB, breast pain and leg pain. Pharmacy is consulted to dose warfarin for atrial fibrillation.   PTA Warfarin Dose: 7.5mg  Mon/Fri and 5mg  AODs with last dose 6/10.  Goal of Therapy:  INR 2-3 Monitor platelets by anticoagulation protocol: Yes   Plan:  Warfarin 7.5mg  tonight x1 Daily INR Monitor s/sx of bleeding  Andrey Cota. Diona Foley, PharmD, BCPS Clinical Pharmacist [phone] 12/27/2016,9:50 PM

## 2016-12-27 NOTE — ED Triage Notes (Signed)
Pt. Stated, I've had SOB with cough and SOB since last Thursday.  My legs hurt and my breast hurts.

## 2016-12-27 NOTE — Progress Notes (Signed)
**  Preliminary report by tech**  Left upper extremity venous duplex complete. There is no evidence of deep or superficial vein thrombosis involving the left upper extremity. All visualized vessels appear patent and compressible. Results were given to the patient's nurse, Ronalee Belts.  12/27/16 6:53 PM [name] RVT

## 2016-12-27 NOTE — H&P (Signed)
History and Physical    [name] MPN:[id number] DOB: Aug 22, 1945 DOA: 12/27/2016  Referring MD/NP/PA:   PCP: [name], MD   Patient coming from:  The patient is coming from home.  At baseline, pt is independent for most of ADL.  Chief Complaint: Shortness of breath and leg edema  HPI: [name] is a 71 y.o. female with medical history significant of hypertension, hyperlipidemia, diabetes mellitus, GERD, hypothyroidism, s/p of kidney transplantation, CKD-3, hearing loss, OSA not on CPAP, atrial fibrillation on Coumadin, s/pf of Maze procedure, s/p of aortic valve replacement with bioprosthetic valve, CAD, s/p of stent placement, dCHF, stroke, asystolic, aortic stenosis, anemia, who presents with SOB and leg edema.  Patient states that she has been having shortness of breath for about 4 days, which has been progressively getting worse. She can barely speak in full sentence. She also noticed that her bilateral leg edema has worsened recently. Patient denies chest pain. She has mild cough with white mucus production, occasionally yellow colored mucus production. She also reports left arm swelling in the past 2 weeks, but no tenderness. She had AVF placement in left arm. She also reports left breast enlargement and swelling, but no pain. Patient denies nausea, vomiting, diarrhea, abdominal pain. No symptoms of UTI. No unilateral weakness. Initial blood pressure 215/91, which improved to 142/57 after giving 20 mg of IV Labetalolol in ED.  ED Course: pt was found to have BNP 1653, negative troponin, INR 2.29, WBC 10.5, stable renal function, temperature normal, tachycardia, oxygen saturation 98% on room air, tachypnea, chest x-ray showed pulmonary edema. Venous Doppler of left arm is negative for DVT. Patient is admitted to telemetry bed as inpatient.  Review of Systems:   General: no fevers, chills, has poor appetite, has fatigue HEENT: no blurry vision, hearing changes or sore  throat Respiratory: has dyspnea, coughing, no wheezing CV: no chest pain, no palpitations GI: no nausea, vomiting, abdominal pain, diarrhea, constipation GU: no dysuria, burning on urination, increased urinary frequency, hematuria  Ext: has leg edema and left arm swelling. Neuro: no unilateral weakness, numbness, or tingling, no vision change or hearing loss Skin: no rash, no skin tear. MSK: No muscle spasm, no deformity, no limitation of range of movement in spin Heme: No easy bruising.  Travel history: No recent long distant travel.  Allergy:  Allergies  Allergen Reactions  . Ibuprofen Nausea And Vomiting  . Sulfamethoxazole-Trimethoprim Itching, Swelling and Rash    Swelling of the face  . Sulfonamide Derivatives Itching, Swelling and Rash    Swelling of the face  . Tape Rash  . Tramadol Nausea And Vomiting  . Hydrocil [Psyllium] Nausea And Vomiting  . Bactrim Itching, Swelling and Rash  . Red Dye Itching and Rash    Past Medical History:  Diagnosis Date  . Anemia, iron deficiency    of chronic disease  . Aortic stenosis    a. Severe AS by echo 11/2012.  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 Asystole ([place])    a. During ENT surgery 2005: developed marked asystole requiring CPR, felt due to vagal reaction (cath nonobst dz).  . Carotid artery disease ([place])    a. Carotid Dopplers performed in August 2013 showed 40-59% left stenosis and 0-39% right; f/u recommended in 2 years.   . Cerebrovascular accident [facility]) 2009   a. LMCA infarct felt embolic 0086, maintained on chronic coumadin.; denies residual on 04/05/2013  . CHF (congestive heart failure) ([place])   .  Cholelithiasis   . Chronic cough onset 03/2010   Dr Melvyn Novas  . Chronic Persistent Atrial Fibrillation 12/31/2008   Qualifier: Diagnosis of  By: [name]    . Coronary artery disease 05/2002   a. Ant MI 2003 s/p PTCA/stent to RCA.   . Diverticulosis of colon   . Esophagitis, reflux   . ESRD (end  stage renal disease) ([place])    a. Mass on L kidney per pt s/p nephrectomy - pt states not cancer - [facility] notes indicate ESRD due to HTN/DM - was previously on HD. b. Kidney transplant 02/2011.  Marland Kitchen GERD (gastroesophageal reflux disease)   . Gout   . Helicobacter pylori (H. pylori) infection    hx of  . Hemorrhoids   . Hx of colonic polyps    adenomatous  . Hyperlipidemia   . Hypertension   . Lung nodule seen on imaging study 04/07/2013   1.0 cm ground glass opacity RUL  . Myocardial infarction ([place]) 2003  . Pericardial effusion    a. Small by echo 11/2011.  Marland Kitchen Pruritic dermatitis    treated with steriods/UV light  . S/P aortic valve replacement with bioprosthetic valve and maze procedure 04/12/2013   12mm [facility] Ease bovine pericardial tissue valve   . S/P Maze operation for atrial fibrillation 04/12/2013   Complete bilateral atrial lesion set using bipolar radiofrequency and cryothermy ablation with clipping of LA appendage  . Sleep apnea    Pt says testing was positive, intolerant of CPAP.  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  . Type II diabetes mellitus ([place])   . Unspecified hearing loss     Past Surgical History:  Procedure Laterality Date  . AORTIC VALVE REPLACEMENT N/A 04/12/2013   Procedure: AORTIC VALVE REPLACEMENT (AVR);  Surgeon: Rexene Alberts, MD;  Location: [place];  Service: Open Heart Surgery;  Laterality: N/A;  . ARTERIOVENOUS GRAFT PLACEMENT Left   . ARTERIOVENOUS GRAFT PLACEMENT Left    "I've had 2 on my left; had one removed" (04/05/2013)   . ARTERY EXPLORATION Right 04/11/2013   Procedure: ARTERY EXPLORATION;  Surgeon: Rexene Alberts, MD;  Location: [place];  Service: Open Heart Surgery;  Laterality: Right;  Right carotid artery exploration  . AV FISTULA PLACEMENT Right   . AV FISTULA REPAIR Right     "took it out" ((/18/2014)  . CARDIOVERSION  05/29/2012   Procedure: CARDIOVERSION;  Surgeon: Lelon Perla, MD;  Location: [facility] ENDOSCOPY;  Service: Cardiovascular;  Laterality: N/A;  . CHOLECYSTECTOMY  2009   with hernia removal  . CORONARY ANGIOPLASTY WITH STENT PLACEMENT Right    coronary artery  . INSERTION OF DIALYSIS CATHETER Bilateral    "over the years; took them both out" (04/05/2013)  . INTRAOPERATIVE TRANSESOPHAGEAL ECHOCARDIOGRAM N/A 04/11/2013   Procedure: INTRAOPERATIVE TRANSESOPHAGEAL ECHOCARDIOGRAM;  Surgeon: Rexene Alberts, MD;  Location: [place];  Service: Open Heart Surgery;  Laterality: N/A;  . INTRAOPERATIVE TRANSESOPHAGEAL ECHOCARDIOGRAM N/A 04/12/2013   Procedure: INTRAOPERATIVE TRANSESOPHAGEAL ECHOCARDIOGRAM;  Surgeon: Rexene Alberts, MD;  Location: [place];  Service: Open Heart Surgery;  Laterality: N/A;  . KIDNEY TRANSPLANT  03/16/11  . LEFT AND RIGHT HEART CATHETERIZATION WITH CORONARY ANGIOGRAM N/A 04/06/2013   Procedure: LEFT AND RIGHT HEART CATHETERIZATION WITH CORONARY ANGIOGRAM;  Surgeon: Blane Ohara, MD;  Location: [facility] CATH LAB;  Service: Cardiovascular;  Laterality:  N/A;  . MAZE N/A 04/12/2013   Procedure: MAZE;  Surgeon: Rexene Alberts, MD;  Location: [place];  Service: Open Heart Surgery;  Laterality: N/A;  . NASAL RECONSTRUCTION WITH SEPTAL REPAIR     "took it out" (04/05/2013)  . NEPHRECTOMY Left 2010   no CA on bx  . TONSILLECTOMY    . TOTAL ABDOMINAL HYSTERECTOMY    . TUBAL LIGATION      Social History:  reports that she quit smoking about 15 years ago. Her smoking use included Cigarettes. She has a 30.00 pack-year smoking history. She has never used smokeless tobacco. She reports that she does not drink alcohol or use drugs.  Family History:  Family History  Problem Relation Age of Onset  . Stroke Father   . Hypertension Mother   . Diabetes Other   . Diabetes Maternal Grandmother   . Diabetes Son      Prior to Admission medications     Medication Sig Start Date End Date Taking? Authorizing Provider  amLODipine (NORVASC) 5 MG tablet TAKE 1/2 (ONE HALF) TABLET BY MOUTH AT BEDTIME Patient taking differently: Take 2.5 mg by mouth at bedtime 07/04/16  Yes Plotnikov, [name], MD  amoxicillin (AMOXIL) 500 MG capsule TAKE ALL 4 CAPSULES PRIOR TO PROCEDURE AS NEEDED Patient taking differently: Take 4 capsules (2,000 mg) by mouth one hour prior to dental procedures 12/15/16  Yes Plotnikov, [name], MD  dapsone 25 MG tablet Take 25 mg by mouth daily.    Yes [provider]  diphenhydrAMINE (BENADRYL) 50 MG capsule Take 1 capsule (50 mg total) by mouth every 6 (six) hours as needed for itching. 08/31/11  Yes Plotnikov, [name], MD  gabapentin (NEURONTIN) 100 MG capsule Take 1 capsule (100 mg total) by mouth 3 (three) times daily. Patient taking differently: Take 100 mg by mouth 2 (two) times daily.  07/07/16  Yes Plotnikov, [name], MD  HUMALOG KWIKPEN 100 UNIT/ML KiwkPen IF SUGAR:140-200 USE 8 UNITS,201-230 10 UNITS,231-260 12 UNITS,261-300 14 UNITS,>301 16 UNITS Patient taking differently: Inject into the skin two to three times a day before a meal as needed per sliding scale: BGL 140-200 = 8 units; 201-230 = 10 units; 231-260 = 12 units; 261-300 = 14 units; >301 = 160 units 07/04/16  Yes Plotnikov, [name], MD  levothyroxine (SYNTHROID, LEVOTHROID) 25 MCG tablet TAKE 2 TABLETS (50 MCG TOTAL) BY MOUTH DAILY. 04/26/16  Yes Plotnikov, [name], MD  metoprolol tartrate (LOPRESSOR) 25 MG tablet TAKE 1 TABLET BY MOUTH TWICE A DAY *NEEDS APPT FOR REFILLS Patient taking differently: Take 25 mg by mouth two times a day 12/15/16  Yes Crenshaw, Denice Bors, MD  Multiple Vitamins-Minerals (CENTRUM SILVER 50+WOMEN) TABS Take 1 tablet by mouth daily with breakfast.   Yes [provider]  mycophenolate (MYFORTIC) 180 MG EC tablet Take 360 mg by mouth 2 (two) times daily.    Yes [provider]  oxyCODONE-acetaminophen  (ROXICET) 5-325 MG tablet Take 0.5-1 tablets by mouth every 6 (six) hours as needed for severe pain. 11/16/16  Yes Plotnikov, [name], MD  tacrolimus (PROGRAF) 1 MG capsule Take 4 mg by mouth 2 (two) times daily.    Yes [provider]  warfarin (COUMADIN) 5 MG tablet Take 1-1.5 tablets by mouth daily as directed by coumadin clinic Patient taking differently: Take 5-7.5 mg by mouth See admin instructions. 5 mg at bedtime on Sun/Tues/Wed/Thurs/Sat and 7.5 mg on Mon/Fri 12/14/16  Yes Crenshaw, Denice Bors, MD  Alum & Mag Hydroxide-Simeth (MAGIC MOUTHWASH) SOLN Take 5 mLs by mouth 4 (four) times daily. Swish, hold and swallow Patient not taking: Reported on 12/27/2016 10/09/13   Plotnikov, [name], MD  insulin degludec (TRESIBA FLEXTOUCH) 100 UNIT/ML SOPN FlexTouch Pen Inject 0.16 mLs (16 Units total) into the skin daily. Patient not taking: Reported on 12/27/2016 06/29/16   Elayne Snare, MD  Insulin Pen Needle 31G X 5 MM MISC Use to administer insulin four times a day Dx E11.9 07/28/16   Elayne Snare, MD  metFORMIN (GLUCOPHAGE-XR) 500 MG 24 hr tablet 1 tablet with supper for the first week, 2 tablets with supper in the second week and then 3 tablets with supper daily Patient not taking: Reported on 12/27/2016 11/22/16   Elayne Snare, MD  metoprolol tartrate (LOPRESSOR) 25 MG tablet Take 1 tablet (25 mg total) by mouth 2 (two) times daily. PLEASE CONTACT OFFICE FOR ADDITIONAL REFILLS 2ND ATTEMPT Patient not taking: Reported on 12/27/2016 11/29/16   Lelon Perla, MD  [facility] DELICA LANCETS 31D MISC Use to check blood sugars twice a day DX E11.9 07/23/16   Plotnikov, [name], MD  [facility] VERIO test strip USE AS INSTRUCTED 4 TIMES A DAY (BEFORE MEALS) & AT BEDTIME (ICD 10-E11.9) 11/15/16   Plotnikov, [name], MD    Physical Exam: Vitals:   12/27/16 2130 12/27/16 2309 12/27/16 2318 12/28/16 0127  BP: (!) 123/49 (!) 180/64  (!) 159/62  Pulse: 64 66  63  Resp:  (!) 25    Temp:  98.2 F (36.8 C)   98.4 F (36.9 C)  TempSrc:  Oral  Oral  SpO2: 99% 97%  99%  Weight:   80.6 kg (177 lb 12.8 oz)   Height:  5\' 1"  (1.549 m)     General: Not in acute distress HEENT:       Eyes: PERRL, EOMI, no scleral icterus.       ENT: No discharge from the ears and nose, no pharynx injection, no tonsillar enlargement.        Neck: positive JVD, no bruit, no mass felt. Heme: No neck lymph node enlargement. Cardiac: S1/S2, RRR, No murmurs, No gallops or rubs. Respiratory: No rales, wheezing, rhonchi or rubs. GI: Soft, nondistended, nontender, no rebound pain, no organomegaly, BS present. GU: No hematuria Ext: 1+ pitting leg edema bilaterally. 2+DP/PT pulse bilaterally. Left arm is swelling, has functioning aVF in left arm. Musculoskeletal: No joint deformities, No joint redness or warmth, no limitation of ROM in spin. Skin: No rashes.  Neuro: Alert, oriented X3, cranial nerves II-XII grossly intact, moves all extremities normally.  Psych: Patient is not psychotic, no suicidal or hemocidal ideation.  Labs on Admission: I have personally reviewed following labs and imaging studies  CBC:  Recent Labs Lab 12/27/16 1551 12/28/16 0355  WBC 10.5 7.3  HGB 12.0 10.6*  HCT 39.5 35.6*  MCV 79.5 79.6  PLT 274 176   Basic Metabolic Panel:  Recent Labs Lab 12/27/16 1551  NA 138  K 3.7  CL 101  CO2 30  GLUCOSE 224*  BUN 18  CREATININE 1.22*  CALCIUM 9.8   GFR: Estimated Creatinine Clearance: 40.1 mL/min (A) (by C-G formula based on SCr of 1.22 mg/dL (H)). Liver Function Tests: No results for input(s): AST, ALT, ALKPHOS, BILITOT, PROT, ALBUMIN in the last 168 hours. No results for input(s): LIPASE, AMYLASE in the last 168 hours. No results for input(s): AMMONIA in the last 168 hours. Coagulation Profile:  Recent Labs Lab  12/27/16 1551 12/28/16 0355  INR 2.29 2.40   Cardiac Enzymes:  Recent Labs Lab 12/27/16 2341  TROPONINI 0.06*   BNP (last 3 results) No results for input(s):  PROBNP in the last 8760 hours. HbA1C: No results for input(s): HGBA1C in the last 72 hours. CBG: No results for input(s): GLUCAP in the last 168 hours. Lipid Profile: No results for input(s): CHOL, HDL, LDLCALC, TRIG, CHOLHDL, LDLDIRECT in the last 72 hours. Thyroid Function Tests: No results for input(s): TSH, T4TOTAL, FREET4, T3FREE, THYROIDAB in the last 72 hours. Anemia Panel: No results for input(s): VITAMINB12, FOLATE, FERRITIN, TIBC, IRON, RETICCTPCT in the last 72 hours. Urine analysis:    Component Value Date/Time   COLORURINE YELLOW 07/02/2016 1440   APPEARANCEUR CLEAR 07/02/2016 1440   LABSPEC 1.025 07/02/2016 1440   PHURINE 6.5 07/02/2016 1440   GLUCOSEU 100 (A) 07/02/2016 1440   HGBUR SMALL (A) 07/02/2016 1440   BILIRUBINUR NEGATIVE 07/02/2016 1440   KETONESUR NEGATIVE 07/02/2016 1440   PROTEINUR 100 (A) 04/22/2013 1808   UROBILINOGEN 0.2 07/02/2016 1440   NITRITE NEGATIVE 07/02/2016 1440   LEUKOCYTESUR NEGATIVE 07/02/2016 1440   Sepsis Labs: @LABRCNTIP (procalcitonin:4,lacticidven:4) )No results found for this or any previous visit (from the past 240 hour(s)).   Radiological Exams on Admission: Dg Chest 2 View  Result Date: 12/27/2016 CLINICAL DATA:  Generalized chest pain with left breast and left arm swelling. Two weeks of cough. Chest pain has a pleuritic component. History of previous CVA and MI, end-stage renal disease. EXAM: CHEST  2 VIEW COMPARISON:  Chest x-ray of May 14, 2013 and CT scan chest of November 10, 2015. FINDINGS: The lungs are reasonably well inflated. The pulmonary interstitial markings are increased. The pulmonary vascularity is engorged. The cardiac silhouette is enlarged. The patient has undergone previous median sternotomy, aortic valve replacement, Maze procedure, and left atrial appendage clip placement. The sternal wires appear intact. The observed bony thorax is unremarkable. IMPRESSION: CHF with pulmonary interstitial edema. No definite  pneumonia nor pleural effusion. Electronically Signed   By: David  Martinique M.D.   On: 12/27/2016 16:25     EKG: Independently reviewed.  Atrial fibrillation, QTC 436, poor R-wave progression, nonspecific T-wave change.   Assessment/Plan Principal Problem:   Acute on chronic diastolic CHF (congestive heart failure) (HCC) Active Problems:   DM type 2 causing renal disease (HCC)   Coronary atherosclerosis   Chronic Persistent Atrial Fibrillation   Long term (current) use of anticoagulants   S/P kidney transplant   S/P aortic valve replacement with bioprosthetic valve and maze procedure   S/P Maze operation for atrial fibrillation   S/P AVR (aortic valve replacement)   Acute respiratory failure with hypoxia (HCC)   Left arm swelling   CKD (chronic kidney disease), stage III   Hypertensive urgency   Pain of left breast   CHF exacerbation (HCC)   Acute respiratory failure with hypoxia due to acute on chronic diastolic CHF: Patient's shortness rest is most likely due to CHF exacerbation. 2-D echo on 04/15/13 showed EF of 65-70%. Patient has a bilateral leg edema, elevated BNP 1653, positive JVD, pulmonary edema on chest x-ray, consistent with CHF exacerbation. -will admit to tele bed as inpt -Lasix 60 mg bid by IV (pt received 80 mg of IV lasix in ED) -trop x 3 -2d echo -will continue home metoprolol -Daily weights -strict I/O's -Low salt diet  Hypertensive urgency: Blood pressure 2:15/91, which improved to 142/57 after treated with IV labetalol. -on IV lasix -Increased amlodipine  from 2.5-5 mg daily -Continue metoprolol -IV hydralazine when necessary   DM-II: Last A1c 8.4 on 11/18/16, poorly controled. Patient is taking metformin, Humalog, Degludec at home (pt is not taking degludec) -will decrease Degludec dose from 16 to 5 unit dailu -SSI  CAD: no CP. S/p of stetn -f/u Trop x 3 as above -continue metoprolol  Atrial Fibrillation: CHA2DS2-VASc Score is 8, needs oral  anticoagulation. Patient is on Coumadin at home. INR is 2.29 on admission. Heart rate is well controlled. -continue metoprolol and coumadin  S/P kidney transplant and CKD-III: renal Fx stable. Baseline creatinine 1.3-1.6. Her creatinine is 1.22, BNP 18.  -Follow-up renal function by edema. -Continue home immunosuppressants, mycophenolate and tacrolimus -Check tacrolimus level  Left arm swelling: Etiology is not clear. Venous Doppler is negative for DVT. Pt has AVF in left arm-->related to AVF? -Will observe closely  Pain of left breast: unclear etiology.  -f/u with PCP-->?possible mammogram as outpatient   DVT ppx: on Coumadin Code Status: Full code Family Communication: None at bed side.   Disposition Plan:  Anticipate discharge back to previous home environment Consults called:  noneDictation #1 AQL:[id number]  TEL:[id number]  Admission status:  Inpatient/tele    Date of Service 12/28/2016    Ivor Costa [facility] Pager [phone]  If 7PM-7AM, please contact night-coverage [email or web address] Password [facility] 12/28/2016, 4:49 AM

## 2016-12-27 NOTE — ED Provider Notes (Signed)
[facility] [facility] DEPT Provider Note   CSN: [id number] Arrival date & time: 12/27/16  1515     History   Chief Complaint Chief Complaint  Patient presents with  . Chest Pain  . Shortness of Breath  . Breast Pain  . Leg Pain    HPI [name] is a 71 y.o. female. Chief complaint is difficulty breathing.  HPI:  71 year old female who reports increased difficulty breathing over the last 4-5 days. For the last 7-10 days she has noticed bilateral lower extremity edema. For the last 4-5 days has noticed marked edema of her left upper extremity only. She weighs herself daily and states that she has gained 15 pounds over the last 10 days. She states that she is now so short of breath that she is "afraid to lay down at night".  71 year old with history of aortic stenosis s/p AVR with pericardial tissue valve by Dr. Roxy Manns. Prior maze procedure. Atrial fibrillation. Embolic CVA. Coronary artery disease status post PTCI RCA.  Last echo in 9/14: Normal LV function no AVI, mild LA enlargement, mild to moderate TR.  Previous ESRD status post renal transplant at [facility] 02/2011. Multiple previous AV graft still in left upper extremity.  Patient states her left breast is tender in feel swollen. She has no significant chest pain. No fevers or chills. She does have a cough. Is not currently on diuretics.  Past Medical History:  Diagnosis Date  . Anemia, iron deficiency    of chronic disease  . Aortic stenosis    a. Severe AS by echo 11/2012.  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 Asystole ([place])    a. During ENT surgery 2005: developed marked asystole requiring CPR, felt due to vagal reaction (cath nonobst dz).  . Carotid artery disease ([place])    a. Carotid Dopplers performed in August 2013 showed 40-59% left stenosis and 0-39% right; f/u recommended in 2 years.   . Cerebrovascular accident [facility]) 2009   a. LMCA infarct felt embolic 9381, maintained on chronic coumadin.;  denies residual on 04/05/2013  . CHF (congestive heart failure) ([place])   . Cholelithiasis   . Chronic cough onset 03/2010   Dr Melvyn Novas  . Chronic Persistent Atrial Fibrillation 12/31/2008   Qualifier: Diagnosis of  By: [name]    . Coronary artery disease 05/2002   a. Ant MI 2003 s/p PTCA/stent to RCA.   . Diverticulosis of colon   . Esophagitis, reflux   . ESRD (end stage renal disease) ([place])    a. Mass on L kidney per pt s/p nephrectomy - pt states not cancer - [facility] notes indicate ESRD due to HTN/DM - was previously on HD. b. Kidney transplant 02/2011.  Marland Kitchen GERD (gastroesophageal reflux disease)   . Gout   . Helicobacter pylori (H. pylori) infection    hx of  . Hemorrhoids   . Hx of colonic polyps    adenomatous  . Hyperlipidemia   . Hypertension   . Lung nodule seen on imaging study 04/07/2013   1.0 cm ground glass opacity RUL  . Myocardial infarction ([place]) 2003  . Pericardial effusion    a. Small by echo 11/2011.  Marland Kitchen Pruritic dermatitis    treated with steriods/UV light  . S/P aortic valve replacement with bioprosthetic valve and maze procedure 04/12/2013   61mm [facility] Ease bovine pericardial tissue valve   . S/P Maze operation for atrial fibrillation 04/12/2013   Complete bilateral atrial lesion set using  bipolar radiofrequency and cryothermy ablation with clipping of LA appendage  . Sleep apnea    Pt says testing was positive, intolerant of CPAP.  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  . Type II diabetes mellitus ([place])   . Unspecified hearing loss     Patient Active Problem List   Diagnosis Date Noted  . Arm muscle atrophy 11/16/2016  . Insomnia 02/25/2016  . Renal cyst 11/21/2015  . Renal mass, right 11/10/2015  . Skin abscess 08/13/2015  . Pain in joint, shoulder region 08/13/2015  . Earache  11/11/2014  . Weight gain 08/12/2014  . Multinodular goiter 05/01/2014  . Foot pain, bilateral 04/12/2014  . Encounter for therapeutic drug monitoring 08/14/2013  . S/P AVR (aortic valve replacement) 05/14/2013  . S/P aortic valve replacement with bioprosthetic valve and maze procedure 04/12/2013  . S/P Maze operation for atrial fibrillation 04/12/2013  . Nodule of right lung 04/07/2013  . Lung nodule seen on imaging study 04/07/2013  . Pain in limb 05/22/2012  . Cramps, muscle, general 02/29/2012  . Dermatitis 01/26/2012  . Knee pain 01/26/2012  . Wrist pain 01/26/2012  . Long term (current) use of anticoagulants 08/17/2011  . S/P kidney transplant 03/16/2011  . Knee pain, right 02/23/2011  . Low back pain 02/23/2011  . Hypersalivation 11/24/2010  . AORTIC STENOSIS 08/20/2010  . DISTURBANCE OF SALIVARY SECRETION 07/28/2010  . COAGULOPATHY 05/27/2010  . NAUSEA 04/14/2010  . DYSPHAGIA, PHARYNGOESOPHAGEAL PHASE 03/10/2010  . End stage renal disease ([place]) 12/09/2009  . SMOKER 10/07/2009  . ALOPECIA 10/07/2009  . SKIN RASH 10/07/2009  . CAROTID STENOSIS 03/04/2009  . PRURITUS 03/04/2009  . STREP INF CCE & UNS SITE GROUP D [ENTEROCOCCUS] 12/31/2008  . ANEMIA 12/31/2008  . Chronic Persistent Atrial Fibrillation 12/31/2008  . ECCHYMOSES 10/29/2008  . Cough 08/27/2008  . ABDOMINAL PAIN 08/27/2008  . NEOPLASM, MALIGNANT, KIDNEY 07/02/2008  . APHASIA DUE TO CEREBROVASCULAR DISEASE 07/02/2008  . Chronic fatigue 05/21/2008  . HYPERLIPIDEMIA 09/27/2007  . PERICARDIAL EFFUSION 09/27/2007  . CEREBROVASCULAR ACCIDENT 09/27/2007  . CONSTIPATION 09/27/2007  . SLEEP APNEA 09/27/2007  . CHOLELITHIASIS 06/09/2007  . ESOPHAGITIS, REFLUX 06/08/2007  . DUODENITIS, WITHOUT HEMORRHAGE 06/08/2007  . HELICOBACTER PYLORI INFECTION, HX OF 06/08/2007  . DM type 2 causing renal disease ([place]) 05/23/2007  . Gout 05/23/2007  . 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 MYOCARDIAL  INFARCTION, HX OF 05/23/2007  . Coronary atherosclerosis 05/23/2007  . Congestive heart failure ([place]) 05/23/2007  . GERD 05/23/2007  . COLONIC POLYPS, HX OF 05/23/2007  . DIVERTICULOSIS, COLON 07/01/2005    Past Surgical History:  Procedure Laterality Date  . AORTIC VALVE REPLACEMENT N/A 04/12/2013   Procedure: AORTIC VALVE REPLACEMENT (AVR);  Surgeon: Rexene Alberts, MD;  Location: [place];  Service: Open Heart Surgery;  Laterality: N/A;  . ARTERIOVENOUS GRAFT PLACEMENT Left   . ARTERIOVENOUS GRAFT PLACEMENT Left    "I've had 2 on my left; had one removed" (04/05/2013)   . ARTERY EXPLORATION Right 04/11/2013   Procedure: ARTERY EXPLORATION;  Surgeon: Rexene Alberts, MD;  Location: [place];  Service: Open Heart Surgery;  Laterality: Right;  Right carotid artery exploration  . AV FISTULA PLACEMENT Right   . AV FISTULA REPAIR Right    "took it out" ((/18/2014)  . CARDIOVERSION  05/29/2012   Procedure: CARDIOVERSION;  Surgeon: Lelon Perla,  MD;  Location: [place];  Service: Cardiovascular;  Laterality: N/A;  . CHOLECYSTECTOMY  2009   with hernia removal  . CORONARY ANGIOPLASTY WITH STENT PLACEMENT Right    coronary artery  . INSERTION OF DIALYSIS CATHETER Bilateral    "over the years; took them both out" (04/05/2013)  . INTRAOPERATIVE TRANSESOPHAGEAL ECHOCARDIOGRAM N/A 04/11/2013   Procedure: INTRAOPERATIVE TRANSESOPHAGEAL ECHOCARDIOGRAM;  Surgeon: Rexene Alberts, MD;  Location: [place];  Service: Open Heart Surgery;  Laterality: N/A;  . INTRAOPERATIVE TRANSESOPHAGEAL ECHOCARDIOGRAM N/A 04/12/2013   Procedure: INTRAOPERATIVE TRANSESOPHAGEAL ECHOCARDIOGRAM;  Surgeon: Rexene Alberts, MD;  Location: [facility];  Service: Open Heart Surgery;  Laterality: N/A;  . KIDNEY TRANSPLANT  03/16/11  . LEFT AND RIGHT HEART CATHETERIZATION WITH CORONARY ANGIOGRAM N/A 04/06/2013   Procedure: LEFT AND RIGHT HEART CATHETERIZATION WITH CORONARY ANGIOGRAM;  Surgeon: Blane Ohara, MD;  Location: [facility] C[facility] LAB;   Service: Cardiovascular;  Laterality: N/A;  . MAZE N/A 04/12/2013   Procedure: MAZE;  Surgeon: Rexene Alberts, MD;  Location: [facility];  Service: Open Heart Surgery;  Laterality: N/A;  . NASAL RECONSTRUCTION WITH SEPTAL REPAIR     "took it out" (04/05/2013)  . NEPHRECTOMY Left 2010   no CA on bx  . TONSILLECTOMY    . TOTAL ABDOMINAL HYSTERECTOMY    . TUBAL LIGATION      OB History    No data available       Home Medications    Prior to Admission medications   Medication Sig Start Date End Date Taking? Authorizing Provider  amLODipine (NORVASC) 5 MG tablet TAKE 1/2 (ONE HALF) TABLET BY MOUTH AT BEDTIME Patient taking differently: Take 2.5 mg by mouth at bedtime 07/04/16  Yes Plotnikov, [name], MD  amoxicillin (AMOXIL) 500 MG capsule TAKE ALL 4 CAPSULES PRIOR TO PROCEDURE AS NEEDED Patient taking differently: Take 4 capsules (2,000 mg) by mouth one hour prior to dental procedures 12/15/16  Yes Plotnikov, [name], MD  dapsone 25 MG tablet Take 25 mg by mouth daily.    Yes [provider]  diphenhydrAMINE (BENADRYL) 50 MG capsule Take 1 capsule (50 mg total) by mouth every 6 (six) hours as needed for itching. 08/31/11  Yes Plotnikov, [name], MD  gabapentin (NEURONTIN) 100 MG capsule Take 1 capsule (100 mg total) by mouth 3 (three) times daily. Patient taking differently: Take 100 mg by mouth 2 (two) times daily.  07/07/16  Yes Plotnikov, [name], MD  HUMALOG KWIKPEN 100 UNIT/ML KiwkPen IF SUGAR:140-200 USE 8 UNITS,201-230 10 UNITS,231-260 12 UNITS,261-300 14 UNITS,>301 16 UNITS Patient taking differently: Inject into the skin two to three times a day before a meal as needed per sliding scale: BGL 140-200 = 8 units; 201-230 = 10 units; 231-260 = 12 units; 261-300 = 14 units; >301 = 160 units 07/04/16  Yes Plotnikov, [name], MD  levothyroxine (SYNTHROID, LEVOTHROID) 25 MCG tablet TAKE 2 TABLETS (50 MCG TOTAL) BY MOUTH DAILY. 04/26/16  Yes Plotnikov, [name], MD    metoprolol tartrate (LOPRESSOR) 25 MG tablet TAKE 1 TABLET BY MOUTH TWICE A DAY *NEEDS APPT FOR REFILLS Patient taking differently: Take 25 mg by mouth two times a day 12/15/16  Yes Crenshaw, Denice Bors, MD  Multiple Vitamins-Minerals (CENTRUM SILVER 50+WOMEN) TABS Take 1 tablet by mouth daily with breakfast.   Yes [provider]  mycophenolate (MYFORTIC) 180 MG EC tablet Take 360 mg by mouth 2 (two) times daily.    Yes [provider]  oxyCODONE-acetaminophen (ROXICET) 5-325 MG tablet Take 0.5-1 tablets by mouth every 6 (six) hours as needed for severe pain. 11/16/16  Yes Plotnikov, [name], MD  tacrolimus (PROGRAF) 1 MG capsule Take 4 mg by mouth 2 (two) times daily.    Yes [provider]  warfarin (COUMADIN) 5 MG tablet Take 1-1.5 tablets by mouth daily as directed by coumadin clinic Patient taking differently: Take 5-7.5 mg by mouth See admin instructions. 5 mg at bedtime on Sun/Tues/Wed/Thurs/Sat and 7.5 mg on Mon/Fri 12/14/16  Yes Crenshaw, Denice Bors, MD  Alum & Mag Hydroxide-Simeth (MAGIC MOUTHWASH) SOLN Take 5 mLs by mouth 4 (four) times daily. Swish, hold and swallow Patient not taking: Reported on 12/27/2016 10/09/13   Plotnikov, [name], MD  insulin degludec (TRESIBA FLEXTOUCH) 100 UNIT/ML SOPN FlexTouch Pen Inject 0.16 mLs (16 Units total) into the skin daily. Patient not taking: Reported on 12/27/2016 06/29/16   Elayne Snare, MD  Insulin Pen Needle 31G X 5 MM MISC Use to administer insulin four times a day Dx E11.9 07/28/16   Elayne Snare, MD  metFORMIN (GLUCOPHAGE-XR) 500 MG 24 hr tablet 1 tablet with supper for the first week, 2 tablets with supper in the second week and then 3 tablets with supper daily Patient not taking: Reported on 12/27/2016 11/22/16   Elayne Snare, MD  metoprolol tartrate (LOPRESSOR) 25 MG tablet Take 1 tablet (25 mg total) by mouth 2 (two) times daily. PLEASE CONTACT OFFICE FOR ADDITIONAL REFILLS 2ND ATTEMPT Patient not taking: Reported on  12/27/2016 11/29/16   Lelon Perla, MD  [facility] DELICA LANCETS 02O MISC Use to check blood sugars twice a day DX E11.9 07/23/16   Plotnikov, [name], MD  ONETOUCH VERIO test strip USE AS INSTRUCTED 4 TIMES A DAY (BEFORE MEALS) & AT BEDTIME (ICD 10-E11.9) 11/15/16   Plotnikov, [name], MD    Family History Family History  Problem Relation Age of Onset  . Stroke Father   . Hypertension Mother   . Diabetes Other   . Diabetes Maternal Grandmother   . Diabetes Son     Social History Social History  Substance Use Topics  . Smoking status: Former Smoker    Packs/day: 1.00    Years: 30.00    Types: Cigarettes    Quit date: 07/19/2001  . Smokeless tobacco: Never Used  . Alcohol use No     Allergies   Ibuprofen; Sulfamethoxazole-trimethoprim; Sulfonamide derivatives; Tape; Tramadol; Hydrocil [psyllium]; Bactrim; and Red dye   Review of Systems Review of Systems  Constitutional: Positive for appetite change and unexpected weight change. Negative for chills, diaphoresis, fatigue and fever.  HENT: Negative for mouth sores, sore throat and trouble swallowing.   Eyes: Negative for visual disturbance.  Respiratory: Positive for cough and shortness of breath. Negative for chest tightness and wheezing.   Cardiovascular: Positive for chest pain and leg swelling.       Orthopnea. Lower extremity edema. Left upper extremity edema. Left breast swelling.  Gastrointestinal: Negative for abdominal distention, abdominal pain, diarrhea, nausea and vomiting.  Endocrine: Negative for polydipsia, polyphagia and polyuria.  Genitourinary: Negative for dysuria, frequency and hematuria.  Musculoskeletal: Negative for gait problem.  Skin: Negative for color change, pallor and rash.  Neurological: Negative for dizziness, syncope, light-headedness and headaches.  Hematological: Does not bruise/bleed easily.  Psychiatric/Behavioral: Negative for behavioral problems and confusion.     Physical  Exam Updated Vital Signs BP (!) 162/87   Pulse (!) 43   Temp 98.4 F (36.9  C) (Oral)   Resp (!) 28   Ht 5\' 2"  (1.575 m)   Wt 78 kg (172 lb)   SpO2 99%   BMI 31.46 kg/m   Physical Exam  Constitutional: She is oriented to person, place, and time. She appears well-developed and well-nourished. No distress.  71 year old female. Dyspneic with conversation.  HENT:  Head: Normocephalic.  Conjunctiva not pale  Eyes: Conjunctivae are normal. Pupils are equal, round, and reactive to light. No scleral icterus.  Neck: Normal range of motion. Neck supple. No thyromegaly present.  No JVD  Cardiovascular: Normal rate and regular rhythm.  Exam reveals no gallop and no friction rub.   No murmur heard. Sinus rhythm on the monitor. 2/6 systolic murmur  Pulmonary/Chest: Effort normal and breath sounds normal. No respiratory distress. She has no wheezes. She has no rales.  Edema to the left breast. No dominant masses. No axillary adenopathy.  Abdominal: Soft. Bowel sounds are normal. She exhibits no distension. There is no tenderness. There is no rebound.  Musculoskeletal: Normal range of motion.  Lymphadenopathy:  3+ edema left upper extremity. 1+ edema bilateral lower extremities. Symmetric.  Neurological: She is alert and oriented to person, place, and time.  Skin: Skin is warm and dry. No rash noted.  Psychiatric: She has a normal mood and affect. Her behavior is normal.     ED Treatments / Results  Labs (all labs ordered are listed, but only abnormal results are displayed) Labs Reviewed  BASIC METABOLIC PANEL - Abnormal; Notable for the following:       Result Value   Glucose, Bld 224 (*)    Creatinine, Ser 1.22 (*)    GFR calc non Af Amer 43 (*)    GFR calc Af Amer 50 (*)    All other components within normal limits  CBC - Abnormal; Notable for the following:    MCH 24.1 (*)    RDW 17.7 (*)    All other components within normal limits  BRAIN NATRIURETIC PEPTIDE - Abnormal;  Notable for the following:    B Natriuretic Peptide 1,653.2 (*)    All other components within normal limits  PROTIME-INR - Abnormal; Notable for the following:    Prothrombin Time 25.7 (*)    All other components within normal limits  D-DIMER, QUANTITATIVE (NOT AT [facility])  I-STAT TROPOININ, ED    EKG  EKG Interpretation  Date/Time:  Monday December 27 2016 15:22:05 EDT Ventricular Rate:  66 PR Interval:  142 QRS Duration: 106 QT Interval:  422 QTC Calculation: 442 R Axis:   80 Text Interpretation:  Sinus rhythm with Premature supraventricular complexes Incomplete left bundle branch block Nonspecific T wave abnormality Abnormal ECG Confirmed by Tanna Furry [phone]) on 12/27/2016 5:17:53 PM       Radiology Dg Chest 2 View  Result Date: 12/27/2016 CLINICAL DATA:  Generalized chest pain with left breast and left arm swelling. Two weeks of cough. Chest pain has a pleuritic component. History of previous CVA and MI, end-stage renal disease. EXAM: CHEST  2 VIEW COMPARISON:  Chest x-ray of May 14, 2013 and CT scan chest of November 10, 2015. FINDINGS: The lungs are reasonably well inflated. The pulmonary interstitial markings are increased. The pulmonary vascularity is engorged. The cardiac silhouette is enlarged. The patient has undergone previous median sternotomy, aortic valve replacement, Maze procedure, and left atrial appendage clip placement. The sternal wires appear intact. The observed bony thorax is unremarkable. IMPRESSION: CHF with pulmonary interstitial edema. No definite pneumonia  nor pleural effusion. Electronically Signed   By: David  Martinique M.D.   On: 12/27/2016 16:25    Procedures Procedures (including critical care time)  Medications Ordered in ED Medications  labetalol (NORMODYNE,TRANDATE) injection 20 mg (not administered)  furosemide (LASIX) injection 80 mg (80 mg Intravenous Given 12/27/16 1927)  labetalol (NORMODYNE,TRANDATE) injection 20 mg (20 mg Intravenous Given  12/27/16 1928)     Initial Impression / Assessment and Plan / ED Course  I have reviewed the triage vital signs and the nursing notes.  Pertinent labs & imaging results that were available during my care of the patient were reviewed by me and considered in my medical decision making (see chart for details).   Concern for left chest venous thrombosis, with left upper extremity, and breast edema. Would suspect subclavian, or more proximal. Does not have facial or neck plethora. Crackles in lungs suggesting congestive heart failure. CHF noted on exam. Lower extremity edema noted. Plan blood pressure control. Diuresis. We'll need imaging of her left upper extremity. We'll start with ultrasound. Await renal function will discussed with renal for any additional contrast imaging performed.  20:59: Duplex Doppler ultrasound of left upper kidney shows no evidence of DVT. Further review with patient and her chart she has been seen and evaluated by primary care and referred to neurology regarding right arm muscle atrophy. The asymmetry of her upper Shoney's may be explained by the atrophy of the right arm and the edema from CHF of her left arm.  She feels much improved with her dyspnea. She has diuresed several 100 mL. Her blood pressure has improved. I will talk with hospitalist regarding admission.  CRITICAL CARE Performed by: Tanna Furry JOSEPH   Total critical care time: 30 minutes  Critical care time was exclusive of separately billable procedures and treating other patients.  Critical care was necessary to treat or prevent imminent or life-threatening deterioration.  Critical care was time spent personally by me on the following activities: development of treatment plan with patient and/or surrogate as well as nursing, discussions with consultants, evaluation of patient's response to treatment, examination of patient, obtaining history from patient or surrogate, ordering and performing treatments  and interventions, ordering and review of laboratory studies, ordering and review of radiographic studies, pulse oximetry and re-evaluation of patient's condition.   Final Clinical Impressions(s) / ED Diagnoses   Final diagnoses:  Congestive heart failure, unspecified HF chronicity, unspecified heart failure type [facility])    New Prescriptions New Prescriptions   No medications on file     Tanna Furry, MD 12/27/16 2102

## 2016-12-28 ENCOUNTER — Other Ambulatory Visit (HOSPITAL_COMMUNITY): Payer: Medicare Other

## 2016-12-28 ENCOUNTER — Inpatient Hospital Stay (HOSPITAL_COMMUNITY): Payer: Medicare Other

## 2016-12-28 DIAGNOSIS — I340 Nonrheumatic mitral (valve) insufficiency: Secondary | ICD-10-CM

## 2016-12-28 DIAGNOSIS — E1121 Type 2 diabetes mellitus with diabetic nephropathy: Secondary | ICD-10-CM

## 2016-12-28 DIAGNOSIS — N644 Mastodynia: Secondary | ICD-10-CM

## 2016-12-28 DIAGNOSIS — I2510 Atherosclerotic heart disease of native coronary artery without angina pectoris: Secondary | ICD-10-CM

## 2016-12-28 DIAGNOSIS — I509 Heart failure, unspecified: Secondary | ICD-10-CM

## 2016-12-28 LAB — CBC
HCT: 35.6 % — ABNORMAL LOW (ref 36.0–46.0)
Hemoglobin: 10.6 g/dL — ABNORMAL LOW (ref 12.0–15.0)
MCH: 23.7 pg — AB (ref 26.0–34.0)
MCHC: 29.8 g/dL — AB (ref 30.0–36.0)
MCV: 79.6 fL (ref 78.0–100.0)
PLATELETS: 231 10*3/uL (ref 150–400)
RBC: 4.47 MIL/uL (ref 3.87–5.11)
RDW: 17.9 % — AB (ref 11.5–15.5)
WBC: 7.3 10*3/uL (ref 4.0–10.5)

## 2016-12-28 LAB — BASIC METABOLIC PANEL
Anion gap: 6 (ref 5–15)
BUN: 19 mg/dL (ref 6–20)
CO2: 32 mmol/L (ref 22–32)
CREATININE: 1.37 mg/dL — AB (ref 0.44–1.00)
Calcium: 9.1 mg/dL (ref 8.9–10.3)
Chloride: 100 mmol/L — ABNORMAL LOW (ref 101–111)
GFR, EST AFRICAN AMERICAN: 43 mL/min — AB (ref >60)
GFR, EST NON AFRICAN AMERICAN: 38 mL/min — AB (ref >60)
Glucose, Bld: 237 mg/dL — ABNORMAL HIGH (ref 65–99)
Potassium: 3 mmol/L — ABNORMAL LOW (ref 3.5–5.1)
SODIUM: 138 mmol/L (ref 135–145)

## 2016-12-28 LAB — GLUCOSE, CAPILLARY
GLUCOSE-CAPILLARY: 239 mg/dL — AB (ref 65–99)
Glucose-Capillary: 407 mg/dL — ABNORMAL HIGH (ref 65–99)
Glucose-Capillary: 185 mg/dL — ABNORMAL HIGH (ref 65–99)
Glucose-Capillary: 215 mg/dL — ABNORMAL HIGH (ref 65–99)
Glucose-Capillary: 214 mg/dL — ABNORMAL HIGH (ref 65–99)

## 2016-12-28 LAB — PROTIME-INR
INR: 2.4
PROTHROMBIN TIME: 26.6 s — AB (ref 11.4–15.2)

## 2016-12-28 LAB — TROPONIN I
TROPONIN I: 0.06 ng/mL — AB (ref <0.03)
TROPONIN I: 0.06 ng/mL — AB (ref <0.03)
Troponin I: 0.06 ng/mL (ref <0.03)

## 2016-12-28 LAB — ECHOCARDIOGRAM COMPLETE
HEIGHTINCHES: 61 in
WEIGHTICAEL: 2822.4 [oz_av]

## 2016-12-28 MED ORDER — POTASSIUM CHLORIDE CRYS ER 20 MEQ PO TBCR
40.0000 meq | EXTENDED_RELEASE_TABLET | Freq: Two times a day (BID) | ORAL | Status: AC
  Administered 2016-12-28 (×2): 40 meq via ORAL
  Filled 2016-12-28 (×2): qty 2

## 2016-12-28 MED ORDER — AMLODIPINE BESYLATE 5 MG PO TABS
5.0000 mg | ORAL_TABLET | Freq: Every day | ORAL | Status: DC
  Administered 2016-12-28 – 2017-01-01 (×5): 5 mg via ORAL
  Filled 2016-12-28 (×5): qty 1

## 2016-12-28 MED ORDER — WARFARIN SODIUM 5 MG PO TABS
5.0000 mg | ORAL_TABLET | ORAL | Status: DC
  Administered 2016-12-28 – 2016-12-30 (×3): 5 mg via ORAL
  Filled 2016-12-28 (×3): qty 1

## 2016-12-28 MED ORDER — WARFARIN SODIUM 7.5 MG PO TABS: 7.5000 mg | ORAL_TABLET | ORAL | Status: DC

## 2016-12-28 NOTE — Progress Notes (Signed)
Lasix 60mg  given this morning, lab resulted with Potassium 3.0. NP on call K.Scorr notified.    Will continue to monitor.  Rokia Bosket, RN

## 2016-12-28 NOTE — Progress Notes (Signed)
New Admission Note:   Arrival Method: Bed  Mental Orientation: Alert and oriented x4 Telemetry:[id number] Assessment: Completed Pain:0/10 Tubes: None  Patient has a upper left fistula that is old and not being used  Safety Measures: Safety Fall Prevention Plan has been discussed  Admission:  3 [place] Orientation: Patient has been orientated to the room, unit and staff.  Family:   Orders to be reviewed and implemented. Will continue to monitor the patient. Call light has been placed within reach and bed alarm has been activated.    Venetia Night, RN Phone: [phone]

## 2016-12-28 NOTE — Progress Notes (Signed)
Patient understands she is on a fluid restriction of 1530mL.  Importance of fluid restriction was discussed with patient and she has been compliant with restriction.    Virl Diamond, Student-RN

## 2016-12-28 NOTE — Progress Notes (Signed)
Pt educated about safety and importance of bed alarm during the night however pt refuses to be on bed alarm. Will continue to round on patient.   Aiyana Stegmann, RN    

## 2016-12-28 NOTE — Progress Notes (Signed)
CRITICAL VALUE ALERT  Critical value received:  Troponin increased from 0.05 to 0.06  Date of notification:  12/28/2016  Time of notification:  12:30  Critical value read back:Yes.    Nurse who received alert:  Fadia Marlar   Notified NP on call K.Scorr. No new orders. No complaints of chest pain.  Will cotinue to monitor.  Valkyrie Guardiola, RN

## 2016-12-28 NOTE — Progress Notes (Signed)
PROGRESS NOTE  [name]  MWN:[id number] DOB: 1946/02/02 DOA: 12/27/2016 PCP: [name], MD Outpatient Specialists:  Subjective: Patient sleepy this morning but easy to awake. Report shortness of breath, still gets dyspneic when she talks.  Brief Narrative:  [name] is a 71 y.o. female with medical history significant of hypertension, hyperlipidemia, diabetes mellitus, GERD, hypothyroidism, s/p of kidney transplantation, CKD-3, hearing loss, OSA not on CPAP, atrial fibrillation on Coumadin, s/pf of Maze procedure, s/p of aortic valve replacement with bioprosthetic valve, CAD, s/p of stent placement, dCHF, stroke, asystolic, aortic stenosis, anemia, who presents with SOB and leg edema.  Patient states that she has been having shortness of breath for about 4 days, which has been progressively getting worse. She can barely speak in full sentence. She also noticed that her bilateral leg edema has worsened recently. Patient denies chest pain. She has mild cough with white mucus production, occasionally yellow colored mucus production. She also reports left arm swelling in the past 2 weeks, but no tenderness. She had AVF placement in left arm. She also reports left breast enlargement and swelling, but no pain. Patient denies nausea, vomiting, diarrhea, abdominal pain. No symptoms of UTI. No unilateral weakness. Initial blood pressure 215/91, which improved to 142/57 after giving 20 mg of IV Labetalolol in ED.  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 -2-D echo showed EF of 40-34% with diastolic dysfunction. -Lasix 60 mg bid by IV (pt received 80 mg of IV lasix in ED) -3 sets of troponin slightly positive at 0.06, flat trend likely secondary to CHF no ACS. -Continue IV Lasix diuresis, follow weight, intake/output and renal function closely.  Hypertensive urgency: Blood pressure 215/91, which improved to 142/57 after treated with IV labetalol. -This is improved, amlodipine increased to 5 mg. -Continue home dose of metoprolol, this is improved.   DM-II: Last A1c 8.4 on 11/18/16, poorly controled. Patient is taking metformin, Humalog, Degludec at home (pt is not taking degludec) -will decrease Degludec dose from 16 to 5 unit. -SSI  CAD: no CP. S/p of PCI -Troponin 3 is 0.06, no typical rise and fall to suggest ACS. No chest pain. -continue metoprolol  Atrial Fibrillation: CHA2DS2-VASc Score is 8, needs oral anticoagulation. Patient is on Coumadin at home. INR is 2.29 on admission. Heart rate is well controlled. -continue metoprolol and coumadin, Coumadin per pharmacy.  S/P kidney transplant and CKD-III: renal Fx stable. Baseline creatinine 1.3-1.6. Her creatinine is 1.22, BNP 18.  -Renal transplant in 2012 -Continue home immunosuppressants, mycophenolate and tacrolimus -Check tacrolimus level  Left arm swelling: Etiology is not clear.  -Venous Doppler is negative, (unlikely to be positive as  patient INR is therapeutic)  -Patient has AVF in the left arm,? Related to AVF. -Keep elevated and observe closely.   Pain of left breast: unclear  etiology.  -Some edema but without redness or warmth, no evidence of infection, this is likely woods.    DVT prophylaxis:  Code Status: Full Code Family Communication:  Disposition Plan:  Diet: Diet renal with fluid restriction Fluid restriction: 1500 mL Fluid; Room service appropriate? Yes; Fluid consistency: Thin  Consultants:   None  Procedures:   2-D echo Study Conclusions  - Left ventricle: The cavity size was normal. Wall thickness was   increased in a pattern of mild LVH. Systolic function was normal.   The estimated ejection fraction was in the range of 55% to 60%.   Doppler parameters are consistent with both elevated ventricular   end-diastolic filling pressure and elevated left atrial filling   pressure. - Aortic valve: There was moderate stenosis. - Mitral valve: Severely calcified, severely thickened annulus.   There was mild regurgitation. - Left atrium: The atrium was moderately dilated. - Atrial septum: No defect or patent foramen ovale was identified.  Antimicrobials:   None   Objective: Vitals:   12/27/16 2318 12/28/16 0127 12/28/16 0502 12/28/16 0656  BP:  (!) 159/62 (!) 127/49   Pulse:  63 (!) 59   Resp:   16   Temp:  98.4 F (36.9 C) 98.5 F (36.9 C)   TempSrc:  Oral Oral   SpO2:  99% 99%   Weight: 80.6 kg (177 lb 12.8 oz)   80 kg (176 lb 6.4 oz)  Height:        Intake/Output Summary (Last 24 hours) at 12/28/16 1100 Last data filed at 12/28/16 1029  Gross per 24 hour  Intake              460 ml  Output             2200 ml  Net            -1740 ml   Filed Weights   12/27/16 1528 12/27/16 2318 12/28/16 0656  Weight: 78 kg (172 lb) 80.6 kg (177 lb 12.8 oz) 80 kg (176 lb 6.4 oz)    Examination: General exam: Appears calm and comfortable  Respiratory system: Clear to auscultation. Respiratory effort normal. Cardiovascular system: S1 & S2 heard, RRR. No JVD, murmurs, rubs, gallops or clicks. No pedal edema. Gastrointestinal system:  Abdomen is nondistended, soft and nontender. No organomegaly or masses felt. Normal bowel sounds heard. Central nervous system: Alert and oriented. No focal neurological deficits. Extremities: Symmetric 5 x 5 power. Skin: No rashes, lesions or ulcers Psychiatry: Judgement and insight appear normal. Mood & affect appropriate.   Data Reviewed: I have personally reviewed following labs and imaging studies  CBC:  Recent Labs Lab 12/27/16 1551 12/28/16 0355  WBC 10.5 7.3  HGB 12.0 10.6*  HCT 39.5 35.6*  MCV 79.5 79.6  PLT 274 852   Basic Metabolic Panel:  Recent Labs Lab 12/27/16 1551 12/28/16 0355  NA 138 138  K 3.7 3.0*  CL 101 100*  CO2 30 32  GLUCOSE 224* 237*  BUN 18 19  CREATININE 1.22* 1.37*  CALCIUM 9.8 9.1   GFR: Estimated Creatinine Clearance: 35.6 mL/min (A) (by C-G formula based on SCr of 1.37 mg/dL (H)). Liver Function Tests: No results for input(s): AST, ALT, ALKPHOS, BILITOT, PROT, ALBUMIN in the last 168 hours. No results for input(s): LIPASE, AMYLASE in the last  168 hours. No results for input(s): AMMONIA in the last 168 hours. Coagulation Profile:  Recent Labs Lab 12/27/16 1551 12/28/16 0355  INR 2.29 2.40   Cardiac Enzymes:  Recent Labs Lab 12/27/16 2341 12/28/16 0355 12/28/16 0933  TROPONINI 0.06* 0.06* 0.06*   BNP (last 3 results) No results for input(s): PROBNP in the last 8760 hours. HbA1C: No results for input(s): HGBA1C in the last 72 hours. CBG:  Recent Labs Lab 12/27/16 2329 12/28/16 0723  GLUCAP 407* 185*   Lipid Profile: No results for input(s): CHOL, HDL, LDLCALC, TRIG, CHOLHDL, LDLDIRECT in the last 72 hours. Thyroid Function Tests: No results for input(s): TSH, T4TOTAL, FREET4, T3FREE, THYROIDAB in the last 72 hours. Anemia Panel: No results for input(s): VITAMINB12, FOLATE, FERRITIN, TIBC, IRON, RETICCTPCT in the last 72 hours. Urine analysis:    Component Value Date/Time   COLORURINE YELLOW 07/02/2016 1440    APPEARANCEUR CLEAR 07/02/2016 1440   LABSPEC 1.025 07/02/2016 1440   PHURINE 6.5 07/02/2016 1440   GLUCOSEU 100 (A) 07/02/2016 1440   HGBUR SMALL (A) 07/02/2016 1440   BILIRUBINUR NEGATIVE 07/02/2016 1440   KETONESUR NEGATIVE 07/02/2016 1440   PROTEINUR 100 (A) 04/22/2013 1808   UROBILINOGEN 0.2 07/02/2016 1440   NITRITE NEGATIVE 07/02/2016 1440   LEUKOCYTESUR NEGATIVE 07/02/2016 1440   Sepsis Labs: @LABRCNTIP (procalcitonin:4,lacticidven:4)  )No results found for this or any previous visit (from the past 240 hour(s)).   Invalid input(s): PROCALCITONIN, LACTICACIDVEN   Radiology Studies: Dg Chest 2 View  Result Date: 12/27/2016 CLINICAL DATA:  Generalized chest pain with left breast and left arm swelling. Two weeks of cough. Chest pain has a pleuritic component. History of previous CVA and MI, end-stage renal disease. EXAM: CHEST  2 VIEW COMPARISON:  Chest x-ray of May 14, 2013 and CT scan chest of November 10, 2015. FINDINGS: The lungs are reasonably well inflated. The pulmonary interstitial markings are increased. The pulmonary vascularity is engorged. The cardiac silhouette is enlarged. The patient has undergone previous median sternotomy, aortic valve replacement, Maze procedure, and left atrial appendage clip placement. The sternal wires appear intact. The observed bony thorax is unremarkable. IMPRESSION: CHF with pulmonary interstitial edema. No definite pneumonia nor pleural effusion. Electronically Signed   By: David  Martinique M.D.   On: 12/27/2016 16:25        Scheduled Meds: . amLODipine  5 mg Oral Daily  . dapsone  25 mg Oral Daily  . furosemide  60 mg Intravenous Q12H  . gabapentin  100 mg Oral BID  . insulin aspart  0-9 Units Subcutaneous TID WC  . insulin glargine  5 Units Subcutaneous Daily  . levothyroxine  50 mcg Oral QAC breakfast  . mouth rinse  15 mL Mouth Rinse BID  . metoprolol tartrate  25 mg Oral BID  . multivitamin with minerals  1 tablet Oral Q  breakfast  . mycophenolate  360 mg Oral BID  . potassium chloride  40 mEq Oral BID WC  . sodium chloride flush  3 mL Intravenous Q12H  . tacrolimus  4 mg Oral BID  . warfarin  5 mg Oral Once per day on Sun Tue Wed Thu Sat  . [START ON 12/31/2016] warfarin  7.5 mg Oral Once per day on Mon Fri  . Warfarin - Pharmacist Dosing Inpatient   Does not apply q1800   Continuous Infusions: . sodium chloride       LOS: 1 day    Time spent: 35 minutes    Aurel Nguyen A, MD  [facility] Pager [phone]  If 7PM-7AM, please contact night-coverage [email or web address] Password [email or web address] 12/28/2016, 11:00 AM

## 2016-12-28 NOTE — Plan of Care (Signed)
Problem: Safety: Goal: Ability to remain free from injury will improve Outcome: Progressing Pt using call bell for assistance.

## 2016-12-28 NOTE — Progress Notes (Signed)
ANTICOAGULATION CONSULT NOTE - Initial Consult  Pharmacy Consult for Warfarin Indication: atrial fibrillation  Allergies  Allergen Reactions  . Ibuprofen Nausea And Vomiting  . Sulfamethoxazole-Trimethoprim Itching, Swelling and Rash    Swelling of the face  . Sulfonamide Derivatives Itching, Swelling and Rash    Swelling of the face  . Tape Rash  . Tramadol Nausea And Vomiting  . Hydrocil [Psyllium] Nausea And Vomiting  . Bactrim Itching, Swelling and Rash  . Red Dye Itching and Rash    Patient Measurements: Height: 5\' 1"  (154.9 cm) Weight: 176 lb 6.4 oz (80 kg) (a scale) IBW/kg (Calculated) : 47.8  Vital Signs: Temp: 98.5 F (36.9 C) (06/12 0502) Temp Source: Oral (06/12 0502) BP: 127/49 (06/12 0502) Pulse Rate: 59 (06/12 0502)  Labs:  Recent Labs  12/27/16 1551 12/27/16 2341 12/28/16 0355  HGB 12.0  --  10.6*  HCT 39.5  --  35.6*  PLT 274  --  231  LABPROT 25.7*  --  26.6*  INR 2.29  --  2.40  CREATININE 1.22*  --  1.37*  TROPONINI  --  0.06* 0.06*    Estimated Creatinine Clearance: 35.6 mL/min (A) (by C-G formula based on SCr of 1.37 mg/dL (H)).  Assessment: 71yo female with history of afib on warfarin, CVA, CAD, and renal transplant in 2012 presents with CP, SOB, breast pain and leg pain. Pharmacy is consulted to dose warfarin for atrial fibrillation. INR 2.4 today  PTA Warfarin Dose: 7.5mg  Mon/Fri and 5mg  AODs  Goal of Therapy:  INR 2-3 Monitor platelets by anticoagulation protocol: Yes   Plan:  Continue Warfarin home dose 7.5 mg on Mon and Fri, 5mg  all other days  Daily INR Monitor s/sx of bleeding  Maryanna Shape, PharmD, BCPS  Clinical Pharmacist  Pager: [phone]   12/28/2016,10:03 AM

## 2016-12-28 NOTE — Progress Notes (Signed)
  Echocardiogram 2D Echocardiogram has been performed.  [name] 12/28/2016, 9:01 AM

## 2016-12-28 NOTE — Progress Notes (Signed)
Inpatient Diabetes Program Recommendations  AACE/ADA: New Consensus Statement on Inpatient Glycemic Control (2015)  Target Ranges:  Prepandial:   less than 140 mg/dL      Peak postprandial:   less than 180 mg/dL (1-2 hours)      Critically ill patients:  140 - 180 mg/dL   Lab Results  Component Value Date   GLUCAP 239 (H) 12/28/2016   HGBA1C 8.4 (H) 11/18/2016    Review of Glycemic Control:  Results for TEASIA, ZAPF (MRN [id number]) as of 12/28/2016 12:43  Ref. Range 12/27/2016 23:29 12/28/2016 07:23 12/28/2016 11:17  Glucose-Capillary Latest Ref Range: 65 - 99 mg/dL 407 (H) 185 (H) 239 (H)    Diabetes history: Type 2 diabetes Outpatient Diabetes medications: Humalog quikpen, Tresiba 16 units daily (Patient not taking) Current orders for Inpatient glycemic control:  Novolog sensitive tid with meals, Lantus 5 units daily Inpatient Diabetes Program Recommendations:   Consider increasing Lantus 8 units daily.  Thanks, Adah Perl, RN, BC-ADM Inpatient Diabetes Coordinator Pager [phone] (8a-5p)

## 2016-12-28 NOTE — Progress Notes (Signed)
Patient stated that she has her home insulin with her. Patient educated that medication should be taken to the pharmacy but patient refused. Patient refused to send her insulin to pharmacy and stated that family will come tomorrow and take it home with them. Will continue to motor.  Chane Cowden, RN

## 2016-12-28 NOTE — Progress Notes (Signed)
When patient arrived to [place] blood sugar was checked. NT reported that blood sugar was 407. Patient stated that she had hamburger prior and that her blood sugar in normally in 200. Paged NP on call K.Scorr and made her aware of Blood sugar results. In addition to Lantus 5 units, Novolog 7 units was ordered as well. Will continue to monitor.  Ilsa Bonello, RN

## 2016-12-28 NOTE — Care Management Note (Signed)
Case Management Note  Patient Details  Name: [name] MRN: [id number] Date of Birth: 1945/09/19  Subjective/Objective:     Admitted with CHF              Action/Plan: PCP: Plotnikov, [name], MD; has private insurance with Medicare / BCBS with prescription drug coverage; CM following for DCP  Expected Discharge Date:                  Expected Discharge Plan:  Home/Self Care  In-House Referral:   [facility]  Discharge planning Services  CM Consult  Status of Service:  In process, will continue to follow  Sherrilyn Rist [phone] 12/28/2016, 12:37 PM

## 2016-12-29 DIAGNOSIS — Z940 Kidney transplant status: Secondary | ICD-10-CM

## 2016-12-29 DIAGNOSIS — R2232 Localized swelling, mass and lump, left upper limb: Secondary | ICD-10-CM

## 2016-12-29 DIAGNOSIS — E1165 Type 2 diabetes mellitus with hyperglycemia: Secondary | ICD-10-CM

## 2016-12-29 DIAGNOSIS — Z794 Long term (current) use of insulin: Secondary | ICD-10-CM

## 2016-12-29 DIAGNOSIS — I10 Essential (primary) hypertension: Secondary | ICD-10-CM

## 2016-12-29 DIAGNOSIS — N63 Unspecified lump in unspecified breast: Secondary | ICD-10-CM

## 2016-12-29 DIAGNOSIS — M109 Gout, unspecified: Secondary | ICD-10-CM

## 2016-12-29 LAB — PROTIME-INR
INR: 2.8
PROTHROMBIN TIME: 30.1 s — AB (ref 11.4–15.2)

## 2016-12-29 LAB — GLUCOSE, CAPILLARY
GLUCOSE-CAPILLARY: 193 mg/dL — AB (ref 65–99)
GLUCOSE-CAPILLARY: 243 mg/dL — AB (ref 65–99)
Glucose-Capillary: 282 mg/dL — ABNORMAL HIGH (ref 65–99)
Glucose-Capillary: 330 mg/dL — ABNORMAL HIGH (ref 65–99)

## 2016-12-29 LAB — BASIC METABOLIC PANEL
ANION GAP: 8 (ref 5–15)
BUN: 26 mg/dL — ABNORMAL HIGH (ref 6–20)
CHLORIDE: 97 mmol/L — AB (ref 101–111)
CO2: 32 mmol/L (ref 22–32)
CREATININE: 1.63 mg/dL — AB (ref 0.44–1.00)
Calcium: 8.8 mg/dL — ABNORMAL LOW (ref 8.9–10.3)
GFR calc non Af Amer: 30 mL/min — ABNORMAL LOW (ref >60)
GFR, EST AFRICAN AMERICAN: 35 mL/min — AB (ref >60)
Glucose, Bld: 251 mg/dL — ABNORMAL HIGH (ref 65–99)
POTASSIUM: 3.7 mmol/L (ref 3.5–5.1)
SODIUM: 137 mmol/L (ref 135–145)

## 2016-12-29 LAB — CBC
HCT: 36 % (ref 36.0–46.0)
HEMOGLOBIN: 10.6 g/dL — AB (ref 12.0–15.0)
MCH: 23.6 pg — ABNORMAL LOW (ref 26.0–34.0)
MCHC: 29.4 g/dL — AB (ref 30.0–36.0)
MCV: 80 fL (ref 78.0–100.0)
Platelets: 241 10*3/uL (ref 150–400)
RBC: 4.5 MIL/uL (ref 3.87–5.11)
RDW: 17.9 % — ABNORMAL HIGH (ref 11.5–15.5)
WBC: 7.2 10*3/uL (ref 4.0–10.5)

## 2016-12-29 LAB — TACROLIMUS LEVEL: Tacrolimus (FK506) - LabCorp: 9.3 ng/mL (ref 2.0–20.0)

## 2016-12-29 MED ORDER — INSULIN GLARGINE 100 UNIT/ML ~~LOC~~ SOLN
10.0000 [IU] | Freq: Every day | SUBCUTANEOUS | Status: DC
  Administered 2016-12-30: 10 [IU] via SUBCUTANEOUS
  Filled 2016-12-29: qty 0.1

## 2016-12-29 MED ORDER — INSULIN ASPART 100 UNIT/ML ~~LOC~~ SOLN
0.0000 [IU] | Freq: Three times a day (TID) | SUBCUTANEOUS | Status: DC
  Administered 2016-12-29 – 2016-12-30 (×2): 5 [IU] via SUBCUTANEOUS
  Administered 2016-12-30: 15 [IU] via SUBCUTANEOUS
  Administered 2016-12-30: 8 [IU] via SUBCUTANEOUS
  Administered 2016-12-31: 3 [IU] via SUBCUTANEOUS
  Administered 2016-12-31: 8 [IU] via SUBCUTANEOUS
  Administered 2016-12-31: 15 [IU] via SUBCUTANEOUS
  Administered 2017-01-01: 5 [IU] via SUBCUTANEOUS
  Administered 2017-01-01: 3 [IU] via SUBCUTANEOUS

## 2016-12-29 MED ORDER — PREDNISONE 20 MG PO TABS
40.0000 mg | ORAL_TABLET | Freq: Every day | ORAL | Status: DC
  Administered 2016-12-29 – 2016-12-31 (×3): 40 mg via ORAL
  Filled 2016-12-29 (×3): qty 2

## 2016-12-29 MED ORDER — INSULIN ASPART 100 UNIT/ML ~~LOC~~ SOLN
0.0000 [IU] | Freq: Every day | SUBCUTANEOUS | Status: DC
  Administered 2016-12-29: 4 [IU] via SUBCUTANEOUS
  Administered 2016-12-30: 3 [IU] via SUBCUTANEOUS

## 2016-12-29 NOTE — Consult Note (Addendum)
Hospital Consult    Reason for Consult:  Arm swelling Requesting Physician:  Dhungel  MRN #:  [id number]  History of Present Illness: This is a 71 y.o. female who presented to the ED a couple of days ago with 4 days of shortness of breath that was getting worse.  She was also having bilateral leg swelling.  She states that she has left arm swelling that has been present for about 2 weeks.  However, on further questioning she has had some intermittent swelling of arm off and on since her left arm graft was placed.  She actually had suspected central vein issue in 2010 but central venogram showed no stenosis.  She has a hx of left arm loop graft, but is not on dialysis as she has had a kidney transplant.  She has hx of nephrectomy in 2010 (left).  She did have a venous duplex of the left arm, which was negative for DVT or superficial vein thrombosis.    She has hx of afib and is on coumadin.  She is on a beta blocker, CCB for blood pressure management.  She is on insulin and metformin for diabetes.  She is on synthroid for hypothyroidism.  She has hx of AVR (prosthetic) and MAZE procedure in 2014.  At that time, she also underwent removal of catheter from the right carotid artery by Dr. Trula Slade.  She has a hx of CVA in 2009.  Carotid duplex in 2015 revealed 1-39% bilateral ICA stenosis.  Hx of CAD with PTCA/stent to RCA in 2003.  She has OSA.  She quit smoking in 2003.   In the ER, she was found to have a BNP of 1653.  Her troponin was negative.  Her creatinine today is 1.63, which is on the rise.   [facility]VS is consulted to evaluate her left arm swelling.  Venous duplex negative for DVT on 12/27/16.   Past Medical History:  Diagnosis Date  . Anemia, iron deficiency    of chronic disease  . Aortic stenosis    a. Severe AS by echo 11/2012.  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 Asystole ([place])    a. During ENT surgery 2005: developed marked asystole requiring CPR, felt due to  vagal reaction (cath nonobst dz).  . Carotid artery disease ([name])    a. Carotid Dopplers performed in August 2013 showed 40-59% left stenosis and 0-39% right; f/u recommended in 2 years.   . Cerebrovascular accident [facility]) 2009   a. LMCA infarct felt embolic 7262, maintained on chronic coumadin.; denies residual on 04/05/2013  . CHF (congestive heart failure) ([place])   . Cholelithiasis   . Chronic cough onset 03/2010   Dr Melvyn Novas  . Chronic Persistent Atrial Fibrillation 12/31/2008   Qualifier: Diagnosis of  By: [name]    . Coronary artery disease 05/2002   a. Ant MI 2003 s/p PTCA/stent to RCA.   . Diverticulosis of colon   . Esophagitis, reflux   . ESRD (end stage renal disease) ([place])    a. Mass on L kidney per pt s/p nephrectomy - pt states not cancer - [facility] notes indicate ESRD due to HTN/DM - was previously on HD. b. Kidney transplant 02/2011.  Marland Kitchen GERD (gastroesophageal reflux disease)   . Gout   . Helicobacter pylori (H. pylori) infection    hx of  . Hemorrhoids   . Hx of colonic polyps    adenomatous  . Hyperlipidemia   . Hypertension   .  Lung nodule seen on imaging study 04/07/2013   1.0 cm ground glass opacity RUL  . Myocardial infarction ([place]) 2003  . Pericardial effusion    a. Small by echo 11/2011.  Marland Kitchen Pruritic dermatitis    treated with steriods/UV light  . S/P aortic valve replacement with bioprosthetic valve and maze procedure 04/12/2013   84mm [name] Ease bovine pericardial tissue valve   . S/P Maze operation for atrial fibrillation 04/12/2013   Complete bilateral atrial lesion set using bipolar radiofrequency and cryothermy ablation with clipping of LA appendage  . Sleep apnea    Pt says testing was positive, intolerant of CPAP.  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  . Type II diabetes mellitus  ([place])   . Unspecified hearing loss     Past Surgical History:  Procedure Laterality Date  . AORTIC VALVE REPLACEMENT N/A 04/12/2013   Procedure: AORTIC VALVE REPLACEMENT (AVR);  Surgeon: Rexene Alberts, MD;  Location: [place];  Service: Open Heart Surgery;  Laterality: N/A;  . ARTERIOVENOUS GRAFT PLACEMENT Left   . ARTERIOVENOUS GRAFT PLACEMENT Left    "I've had 2 on my left; had one removed" (04/05/2013)   . ARTERY EXPLORATION Right 04/11/2013   Procedure: ARTERY EXPLORATION;  Surgeon: Rexene Alberts, MD;  Location: [place];  Service: Open Heart Surgery;  Laterality: Right;  Right carotid artery exploration  . AV FISTULA PLACEMENT Right   . AV FISTULA REPAIR Right    "took it out" ((/18/2014)  . CARDIOVERSION  05/29/2012   Procedure: CARDIOVERSION;  Surgeon: Lelon Perla, MD;  Location: [facility] ENDOSCOPY;  Service: Cardiovascular;  Laterality: N/A;  . CHOLECYSTECTOMY  2009   with hernia removal  . CORONARY ANGIOPLASTY WITH STENT PLACEMENT Right    coronary artery  . INSERTION OF DIALYSIS CATHETER Bilateral    "over the years; took them both out" (04/05/2013)  . INTRAOPERATIVE TRANSESOPHAGEAL ECHOCARDIOGRAM N/A 04/11/2013   Procedure: INTRAOPERATIVE TRANSESOPHAGEAL ECHOCARDIOGRAM;  Surgeon: Rexene Alberts, MD;  Location: [name];  Service: Open Heart Surgery;  Laterality: N/A;  . INTRAOPERATIVE TRANSESOPHAGEAL ECHOCARDIOGRAM N/A 04/12/2013   Procedure: INTRAOPERATIVE TRANSESOPHAGEAL ECHOCARDIOGRAM;  Surgeon: Rexene Alberts, MD;  Location: [place];  Service: Open Heart Surgery;  Laterality: N/A;  . KIDNEY TRANSPLANT  03/16/11  . LEFT AND RIGHT HEART CATHETERIZATION WITH CORONARY ANGIOGRAM N/A 04/06/2013   Procedure: LEFT AND RIGHT HEART CATHETERIZATION WITH CORONARY ANGIOGRAM;  Surgeon: Blane Ohara, MD;  Location: [facility] CATH LAB;  Service: Cardiovascular;  Laterality: N/A;  . MAZE N/A 04/12/2013   Procedure: MAZE;  Surgeon: Rexene Alberts, MD;  Location: [place];  Service: Open Heart Surgery;   Laterality: N/A;  . NASAL RECONSTRUCTION WITH SEPTAL REPAIR     "took it out" (04/05/2013)  . NEPHRECTOMY Left 2010   no CA on bx  . TONSILLECTOMY    . TOTAL ABDOMINAL HYSTERECTOMY    . TUBAL LIGATION      Allergies  Allergen Reactions  . Ibuprofen Nausea And Vomiting  . Sulfamethoxazole-Trimethoprim Itching, Swelling and Rash    Swelling of the face  . Sulfonamide Derivatives Itching, Swelling and Rash    Swelling of the face  . Tape Rash  . Tramadol Nausea And Vomiting  . Hydrocil [Psyllium] Nausea And Vomiting  . Bactrim Itching, Swelling and Rash  . Red Dye Itching and Rash  Prior to Admission medications   Medication Sig Start Date End Date Taking? Authorizing Provider  amLODipine (NORVASC) 5 MG tablet TAKE 1/2 (ONE HALF) TABLET BY MOUTH AT BEDTIME Patient taking differently: Take 2.5 mg by mouth at bedtime 07/04/16  Yes Plotnikov, [name], MD  amoxicillin (AMOXIL) 500 MG capsule TAKE ALL 4 CAPSULES PRIOR TO PROCEDURE AS NEEDED Patient taking differently: Take 4 capsules (2,000 mg) by mouth one hour prior to dental procedures 12/15/16  Yes Plotnikov, [name], MD  dapsone 25 MG tablet Take 25 mg by mouth daily.    Yes [provider]  diphenhydrAMINE (BENADRYL) 50 MG capsule Take 1 capsule (50 mg total) by mouth every 6 (six) hours as needed for itching. 08/31/11  Yes Plotnikov, [name], MD  gabapentin (NEURONTIN) 100 MG capsule Take 1 capsule (100 mg total) by mouth 3 (three) times daily. Patient taking differently: Take 100 mg by mouth 2 (two) times daily.  07/07/16  Yes Plotnikov, [name], MD  HUMALOG KWIKPEN 100 UNIT/ML KiwkPen IF SUGAR:140-200 USE 8 UNITS,201-230 10 UNITS,231-260 12 UNITS,261-300 14 UNITS,>301 16 UNITS Patient taking differently: Inject into the skin two to three times a day before a meal as needed per sliding scale: BGL 140-200 = 8 units; 201-230 = 10 units; 231-260 = 12 units; 261-300 = 14 units; >301 = 160 units 07/04/16  Yes Plotnikov,  [name], MD  levothyroxine (SYNTHROID, LEVOTHROID) 25 MCG tablet TAKE 2 TABLETS (50 MCG TOTAL) BY MOUTH DAILY. 04/26/16  Yes Plotnikov, [name], MD  metoprolol tartrate (LOPRESSOR) 25 MG tablet TAKE 1 TABLET BY MOUTH TWICE A DAY *NEEDS APPT FOR REFILLS Patient taking differently: Take 25 mg by mouth two times a day 12/15/16  Yes Crenshaw, Denice Bors, MD  Multiple Vitamins-Minerals (CENTRUM SILVER 50+WOMEN) TABS Take 1 tablet by mouth daily with breakfast.   Yes [provider]  mycophenolate (MYFORTIC) 180 MG EC tablet Take 360 mg by mouth 2 (two) times daily.    Yes [provider]  oxyCODONE-acetaminophen (ROXICET) 5-325 MG tablet Take 0.5-1 tablets by mouth every 6 (six) hours as needed for severe pain. 11/16/16  Yes Plotnikov, [name], MD  tacrolimus (PROGRAF) 1 MG capsule Take 4 mg by mouth 2 (two) times daily.    Yes [provider]  warfarin (COUMADIN) 5 MG tablet Take 1-1.5 tablets by mouth daily as directed by coumadin clinic Patient taking differently: Take 5-7.5 mg by mouth See admin instructions. 5 mg at bedtime on Sun/Tues/Wed/Thurs/Sat and 7.5 mg on Mon/Fri 12/14/16  Yes Crenshaw, Denice Bors, MD  Alum & Mag Hydroxide-Simeth (MAGIC MOUTHWASH) SOLN Take 5 mLs by mouth 4 (four) times daily. Swish, hold and swallow Patient not taking: Reported on 12/27/2016 10/09/13   Plotnikov, [name], MD  insulin degludec (TRESIBA FLEXTOUCH) 100 UNIT/ML SOPN FlexTouch Pen Inject 0.16 mLs (16 Units total) into the skin daily. Patient not taking: Reported on 12/27/2016 06/29/16   Elayne Snare, MD  Insulin Pen Needle 31G X 5 MM MISC Use to administer insulin four times a day Dx E11.9 07/28/16   Elayne Snare, MD  metFORMIN (GLUCOPHAGE-XR) 500 MG 24 hr tablet 1 tablet with supper for the first week, 2 tablets with supper in the second week and then 3 tablets with supper daily Patient not taking: Reported on 12/27/2016 11/22/16   Elayne Snare, MD  metoprolol tartrate (LOPRESSOR) 25 MG tablet  Take 1 tablet (25 mg total) by mouth 2 (two) times daily. PLEASE CONTACT OFFICE FOR ADDITIONAL REFILLS 2ND  ATTEMPT Patient not taking: Reported on 12/27/2016 11/29/16   Lelon Perla, MD  [facility] DELICA LANCETS 24O MISC Use to check blood sugars twice a day DX E11.9 07/23/16   Plotnikov, [name], MD  [facility] VERIO test strip USE AS INSTRUCTED 4 TIMES A DAY (BEFORE MEALS) & AT BEDTIME (ICD 10-E11.9) 11/15/16   Plotnikov, [name], MD    Social History   Social History  . Marital status: Married    Spouse name: N/A  . Number of children: 5  . Years of education: N/A   Occupational History  . retired Retired   Social History Main Topics  . Smoking status: Former Smoker    Packs/day: 1.00    Years: 30.00    Types: Cigarettes    Quit date: 07/19/2001  . Smokeless tobacco: Never Used  . Alcohol use No  . Drug use: No  . Sexual activity: Not Currently   Other Topics Concern  . Not on file   Social History Narrative   Patient signed a Designated Party Release to allow her spouse [name] and family and five children to have access to her medical records/information.       Family History  Problem Relation Age of Onset  . Stroke Father   . Hypertension Mother   . Diabetes Other   . Diabetes Maternal Grandmother   . Diabetes Son     ROS: [x]  Positive   [ ]  Negative   [ ]  All sytems reviewed and are negative  Cardiac: []  chest pain/pressure []  palpitations [x]  CHF [x]  SOB [x]  DOE [x]  hx AVR (prosthetic) [x]  hx MAZE procedure for afib  Vascular: []  pain in legs while walking []  pain in legs at rest []  pain in legs at night []  non-healing ulcers []  hx of DVT [x]  swelling in legs  Pulmonary: []  productive cough []  asthma/wheezing []  home O2 [x]  OSA  Neurologic: []  weakness in []  arms []  legs []  numbness in []  arms []  legs [x]  hx of CVA []  mini stroke [] difficulty speaking or slurred speech []  temporary loss of vision in one eye []   dizziness  Hematologic: []  hx of cancer []  bleeding problems []  problems with blood clotting easily [x]  anemia  Endocrine:   [x]  diabetes [x]  thyroid disease  GI []  vomiting blood []  blood in stool [x]  GERD  GU: [x]  CKD/renal failure []  HD--[]  M/W/F or []  T/T/S [x]  hx nephrectomy [x]  hx of renal transplant []  burning with urination []  blood in urine  Psychiatric: []  anxiety []  depression  Musculoskeletal: []  arthritis []  joint pain [x]  hx gout  Integumentary: []  rashes []  ulcers  Constitutional: []  fever []  chills   Physical Examination  Vitals:   12/29/16 0341 12/29/16 1138  BP: (!) 159/48 (!) 137/55  Pulse: 60 69  Resp: 17 18  Temp: 97.4 F (36.3 C) 98.8 F (37.1 C)   Body mass index is 33.22 kg/m.  General:  WDWN in NAD Gait: Not observed HENT: WNL, normocephalic Pulmonary: normal non-labored breathing, without Rales, rhonchi,  wheezing Cardiac:regular rate and rhythm Skin: without rashes Vascular Exam/Pulses:Extremities:1+ left radial, 2 + right radial pulse, pulsatile upper arm graft  Musculoskeletal: no muscle wasting or atrophy, diffuse edema left arm extending from shoulder to hand, no skin compromise, chest wall collaterals present  Neurologic: A&O X 3;  No focal weakness or paresthesias are detected; speech is fluent/normal  CBC    Component Value Date/Time   WBC 7.2 12/29/2016 0251   RBC 4.50 12/29/2016  0251   HGB 10.6 (L) 12/29/2016 0251   HCT 36.0 12/29/2016 0251   PLT 241 12/29/2016 0251   MCV 80.0 12/29/2016 0251   MCH 23.6 (L) 12/29/2016 0251   MCHC 29.4 (L) 12/29/2016 0251   RDW 17.9 (H) 12/29/2016 0251   LYMPHSABS 1.2 11/16/2016 1659   MONOABS 0.8 11/16/2016 1659   EOSABS 0.1 11/16/2016 1659   BASOSABS 0.0 11/16/2016 1659    BMET    Component Value Date/Time   NA 137 12/29/2016 0251   K 3.7 12/29/2016 0251   CL 97 (L) 12/29/2016 0251   CO2 32 12/29/2016 0251   GLUCOSE 251 (H) 12/29/2016 0251   GLUCOSE 126 (H)  06/13/2006 1525   BUN 26 (H) 12/29/2016 0251   CREATININE 1.63 (H) 12/29/2016 0251   CREATININE 1.35 (H) 03/30/2013 1634   CALCIUM 8.8 (L) 12/29/2016 0251   CALCIUM 9.7 01/29/2008 1338   GFRNONAA 30 (L) 12/29/2016 0251   GFRAA 35 (L) 12/29/2016 0251    COAGS: Lab Results  Component Value Date   INR 2.80 12/29/2016   INR 2.40 12/28/2016   INR 2.29 12/27/2016     Non-Invasive Vascular Imaging:   Venous Duplex Left arm 12/27/16: Left upper extremity venous duplex complete. There is no evidence of deep or superficial vein thrombosis involving the left upper extremity. All visualized vessels appear patent and compressible.  2D Echocardiogram 12/28/16: Study Conclusions  - Left ventricle: The cavity size was normal. Wall thickness was   increased in a pattern of mild LVH. Systolic function was normal.   The estimated ejection fraction was in the range of 55% to 60%.   Doppler parameters are consistent with both elevated ventricular   end-diastolic filling pressure and elevated left atrial filling   pressure. - Aortic valve: There was moderate stenosis. - Mitral valve: Severely calcified, severely thickened annulus.   There was mild regurgitation. - Left atrium: The atrium was moderately dilated. - Atrial septum: No defect or patent foramen ovale was identified.   Statin:  No. Beta Blocker:  Yes.   Aspirin:  No. ACEI:  No. ARB:  No. CCB use:  Yes Other antiplatelets/anticoagulants:  Yes.   Coumadin   ASSESSMENT/PLAN: This is a 71 y.o. female with left arm swelling with hx of LUA av graft.  Most likely swelling is result of central vein stenosis.  Since she has functioning kidney transplant would proceed with graft ligation to see if symptoms improve.  If symptoms do not resolve with this could consider central venogram but would try to avoid this to avoid risk of contrast nephropathy to transplant kidney  INR currently 2.8 and on coumadin.  In light of this and recent CHF  episode would defer ligation of graft for now and proceed as outpt.    Our office will call her to schedule when to stop her coumadin and procedure date  Ruta Hinds, MD [facility]: [phone] Pager: [phone]

## 2016-12-29 NOTE — H&P (Deleted)
PROGRESS NOTE                                                                                                                                                                                                             Patient Demographics:    [name], is a 71 y.o. female, DOB - November 01, 1945, [id number]  Admit date - 12/27/2016   Admitting Physician Ivor Costa, MD  Outpatient Primary MD for the patient is Plotnikov, [name], MD  LOS - 2  Outpatient Specialists:   Chief Complaint  Patient presents with  . Chest Pain  . Shortness of Breath  . Breast Pain  . Leg Pain       Brief Narrative   71 year old female with hypertension, hyperlipidemia, history of kidney transplant in 2012, chronic kidney disease stage III, OSA not on CPAP, A. fib on Coumadin, history of maze procedure, aortic stenosis with aortic valve replacement with bioprosthetic valve, CAD, history of stroke, diastolic CHF, who presented with increased shortness of breath of 4 days duration with bilateral leg edema. She also noticed increased swelling and heaviness of her left breast along with swelling of her left arm of about 2 weeks duration. In the ED she was in hypertensive crisis with blood pressure of 215/91 mmHg which improved after receiving IV labetalol. Also had positive JVD with pulmonary edema on chest x-ray and elevated BNP of 1653. Patient admitted for acute on chronic diastolic CHF.    Subjective:   Patient informs her breathing to be slightly better but still complaining of swelling in her left forearm and heaviness of her left breast.   Assessment  & Plan :    Principal Problem:   Acute respiratory failure secondary to Acute on chronic diastolic CHF (congestive heart failure) (HCC) Slowly improving but still requiring 2 L oxygen via nasal cannula. Diuresing well on IV Lasix. Continue strict I/O and daily weight. 2-D echo shows EF of  49-82% with diastolic dysfunction.  Active Problems: Hypertensive crisis Improved with IV labetalol. Amlodipine dose increased and resume home dose metoprolol. Currently stable.  Left forearm and left breast swelling Nontender and exam. Venous Dr. Henderson Cloud for DVT. Has chronic AV fistula in her left arm. No recent procedure or injury. Have consulted vascular surgery for further evaluation and recommendation.  Acute gouty arthritis or right knee I will  place her on 5 day course of daily oral prednisone.  History of renal transplant and chronic kidney disease stage III Currently stable on baseline (1.3-1.6). Monitor with diuresis. Continue home mycophenolate and tacrolimus.  Atrial fibrillation Continue Coumadin. INR Therapeutic. Rate controlled on metoprolol.     DM type 2 causing renal disease ([place]) Poorly controlled. Increase Lantus to 10 units at bedtime and switch to moderate sliding scale coverage.  Elevated troponin Mild. Possibly due to acute CHF. No chest pain symptoms.  Coronary artery disease Continue beta blocker.    Code Status : Full code  Family Communication  : None at bedside  Disposition Plan  : Home once improved, possibly in the next 2540 hours  Barriers For Discharge : Active symptoms  Consults  :   Vascular surgery  Procedures  :  2-D echo Doppler left upper extremity  DVT Prophylaxis  :  Coumadin  Lab Results  Component Value Date   PLT 241 12/29/2016    Antibiotics    Anti-infectives    Start     Dose/Rate Route Frequency Ordered Stop   12/28/16 1000  dapsone tablet 25 mg     25 mg Oral Daily 12/27/16 2143          Objective:   Vitals:   12/28/16 2245 12/29/16 0100 12/29/16 0341 12/29/16 1138  BP: (!) 158/57 (!) 159/48 (!) 159/48 (!) 137/55  Pulse: 65 60 60 69  Resp: 18 17 17 18   Temp: 99.1 F (37.3 C) 98.2 F (36.8 C) 97.4 F (36.3 C) 98.8 F (37.1 C)  TempSrc: Oral Oral Oral Oral  SpO2: 100% 98% 94% 90%  Weight:   79.7  kg (175 lb 12.8 oz)   Height:        Wt Readings from Last 3 Encounters:  12/29/16 79.7 kg (175 lb 12.8 oz)  11/22/16 78.2 kg (172 lb 6.4 oz)  11/16/16 76.2 kg (168 lb 1.3 oz)     Intake/Output Summary (Last 24 hours) at 12/29/16 1357 Last data filed at 12/29/16 0801  Gross per 24 hour  Intake              410 ml  Output             2700 ml  Net            -2290 ml     Physical Exam  Gen: not in distress HEENT:  moist mucosa, supple neck, No JVD  Chest: clear b/l, no added sounds, enlarged left breast with enlargement and heaviness, nontender , no discharge CVS: Loud S1, normal S2, no murmurs rub or gallop GI: soft, NT, ND, BS+ Musculoskeletal: warm, 1+ pitting edema bilaterally, right knee swollen with mild tenderness and limited ROM.     Data Review:    CBC  Recent Labs Lab 12/27/16 1551 12/28/16 0355 12/29/16 0251  WBC 10.5 7.3 7.2  HGB 12.0 10.6* 10.6*  HCT 39.5 35.6* 36.0  PLT 274 231 241  MCV 79.5 79.6 80.0  MCH 24.1* 23.7* 23.6*  MCHC 30.4 29.8* 29.4*  RDW 17.7* 17.9* 17.9*    Chemistries   Recent Labs Lab 12/27/16 1551 12/28/16 0355 12/29/16 0251  NA 138 138 137  K 3.7 3.0* 3.7  CL 101 100* 97*  CO2 30 32 32  GLUCOSE 224* 237* 251*  BUN 18 19 26*  CREATININE 1.22* 1.37* 1.63*  CALCIUM 9.8 9.1 8.8*   ------------------------------------------------------------------------------------------------------------------ No results for input(s): CHOL, HDL, LDLCALC, TRIG, CHOLHDL, LDLDIRECT in  the last 72 hours.  Lab Results  Component Value Date   HGBA1C 8.4 (H) 11/18/2016   ------------------------------------------------------------------------------------------------------------------ No results for input(s): TSH, T4TOTAL, T3FREE, THYROIDAB in the last 72 hours.  Invalid input(s): FREET3 ------------------------------------------------------------------------------------------------------------------ No results for input(s): VITAMINB12,  FOLATE, FERRITIN, TIBC, IRON, RETICCTPCT in the last 72 hours.  Coagulation profile  Recent Labs Lab 12/27/16 1551 12/28/16 0355 12/29/16 0251  INR 2.29 2.40 2.80     Recent Labs  12/27/16 1551  DDIMER 0.45    Cardiac Enzymes  Recent Labs Lab 12/27/16 2341 12/28/16 0355 12/28/16 0933  TROPONINI 0.06* 0.06* 0.06*   ------------------------------------------------------------------------------------------------------------------    Component Value Date/Time   BNP 1,653.2 (H) 12/27/2016 1551    Inpatient Medications  Scheduled Meds: . amLODipine  5 mg Oral Daily  . dapsone  25 mg Oral Daily  . furosemide  60 mg Intravenous Q12H  . gabapentin  100 mg Oral BID  . insulin aspart  0-9 Units Subcutaneous TID WC  . insulin glargine  5 Units Subcutaneous Daily  . levothyroxine  50 mcg Oral QAC breakfast  . mouth rinse  15 mL Mouth Rinse BID  . metoprolol tartrate  25 mg Oral BID  . multivitamin with minerals  1 tablet Oral Q breakfast  . mycophenolate  360 mg Oral BID  . sodium chloride flush  3 mL Intravenous Q12H  . tacrolimus  4 mg Oral BID  . warfarin  5 mg Oral Once per day on Sun Tue Wed Thu Sat  . [START ON 12/31/2016] warfarin  7.5 mg Oral Once per day on Mon Fri  . Warfarin - Pharmacist Dosing Inpatient   Does not apply q1800   Continuous Infusions: . sodium chloride     PRN Meds:.sodium chloride, acetaminophen, dextromethorphan-guaiFENesin, diphenhydrAMINE, hydrALAZINE, ondansetron (ZOFRAN) IV, oxyCODONE-acetaminophen, polyethylene glycol, sodium chloride flush  Micro Results No results found for this or any previous visit (from the past 240 hour(s)).  Radiology Reports Dg Chest 2 View  Result Date: 12/27/2016 CLINICAL DATA:  Generalized chest pain with left breast and left arm swelling. Two weeks of cough. Chest pain has a pleuritic component. History of previous CVA and MI, end-stage renal disease. EXAM: CHEST  2 VIEW COMPARISON:  Chest x-ray of  May 14, 2013 and CT scan chest of November 10, 2015. FINDINGS: The lungs are reasonably well inflated. The pulmonary interstitial markings are increased. The pulmonary vascularity is engorged. The cardiac silhouette is enlarged. The patient has undergone previous median sternotomy, aortic valve replacement, Maze procedure, and left atrial appendage clip placement. The sternal wires appear intact. The observed bony thorax is unremarkable. IMPRESSION: CHF with pulmonary interstitial edema. No definite pneumonia nor pleural effusion. Electronically Signed   By: David  Martinique M.D.   On: 12/27/2016 16:25    Time Spent in minutes  35   Louellen Molder M.D on 12/29/2016 at 1:57 PM  Between 7am to 7pm - Pager - [phone]  After 7pm go to [email or web address] - password [facility]  [facility] -  Office  [phone]

## 2016-12-29 NOTE — Discharge Instructions (Signed)

## 2016-12-29 NOTE — Progress Notes (Signed)
Inpatient Diabetes Program Recommendations  AACE/ADA: New Consensus Statement on Inpatient Glycemic Control (2015)  Target Ranges:  Prepandial:   less than 140 mg/dL      Peak postprandial:   less than 180 mg/dL (1-2 hours)      Critically ill patients:  140 - 180 mg/dL   Lab Results  Component Value Date   GLUCAP 282 (H) 12/29/2016   HGBA1C 8.4 (H) 11/18/2016    Review of Glycemic Control  Diabetes history: Type 2 diabetes Outpatient Diabetes medications: Humalog quikpen, Tresiba 16 units daily (Patient not taking) Current orders for Inpatient glycemic control:  Novolog sensitive tid with meals, Lantus 5 units daily  Inpatient Diabetes Program Recommendations:   Consider increasing Lantus 8 units daily. Novolog 2-3 units tid if eats 50%  Thank you, Nani Gasser. Terrion Poblano, RN, MSN, CDE  Diabetes Coordinator Inpatient Glycemic Control Team Team Pager [phone] (8am-5pm) 12/29/2016 12:43 PM

## 2016-12-29 NOTE — Consult Note (Signed)
   [name] CM Inpatient Consult   12/29/2016  [name] 1945/08/04 [id number]  Patient wa admitted with HF exacerbation.  Patient is in the Medicare ACO and has been noted to have the EMMI HF calls assugned by the inpatient RNCM.  Met with the patient to explain the calls and a brochure, flyer and 24 hour nurse advise line magnet given.  Sheet given to explain the EMMI HF calls.  Patient verbalized understanding. Please note that [facility] does not interfere with or replace any services arranged by the inpatient care management staff.  For questions, please contact:  Natividad Brood, RN BSN [facility] Liaison  [phone] business mobile phone Toll free office [phone]

## 2016-12-29 NOTE — Progress Notes (Signed)
PROGRESS NOTE                                                                                                                                                                                                             Patient Demographics:    [name], is a 71 y.o. female, DOB - October 30, 1945, [id number]  Admit date - 12/27/2016   Admitting Physician Ivor Costa, MD  Outpatient Primary MD for the patient is Plotnikov, [name], MD  LOS - 2  Outpatient Specialists:  Chief Complaint  Patient presents with  . Chest Pain  . Shortness of Breath  . Breast Pain  . Leg Pain       Brief Narrative   71 year old female with hypertension, hyperlipidemia, history of kidney transplant in 2012, chronic kidney disease stage III, OSA not on CPAP, A. fib on Coumadin, history of maze procedure, aortic stenosis with aortic valve replacement with bioprosthetic valve, CAD, history of stroke, diastolic CHF, who presented with increased shortness of breath of 4 days duration with bilateral leg edema. She also noticed increased swelling and heaviness of her left breast along with swelling of her left arm of about 2 weeks duration. In the ED she was in hypertensive crisis with blood pressure of 215/91 mmHg which improved after receiving IV labetalol. Also had positive JVD with pulmonary edema on chest x-ray and elevated BNP of 1653. Patient admitted for acute on chronic diastolic CHF.     Subjective:   Patient informs her breathing to be slightly better but still complaining of swelling in her left forearm and heaviness of her left breast.   Assessment  & Plan :   Principal Problem:   Acute respiratory failure secondary to Acute on chronic diastolic CHF (congestive heart failure) (HCC) Slowly improving but still requiring 2 L oxygen via nasal cannula. Diuresing well on IV Lasix. Continue strict I/O and daily weight. 2-D echo shows EF  of 22-63% with diastolic dysfunction.  Active Problems: Hypertensive crisis Improved with IV labetalol. Amlodipine dose increased and resume home dose metoprolol. Currently stable.  Left forearm and left breast swelling Nontender and exam. Venous Dr. Henderson Cloud for DVT. Has chronic AV fistula in her left arm. No recent procedure or injury. Have consulted vascular surgery for further evaluation and recommendation.  Acute gouty arthritis or right knee I will place  her on 5 day course of daily oral prednisone.  History of renal transplant and chronic kidney disease stage III Currently stable on baseline (1.3-1.6). Monitor with diuresis. Continue home mycophenolate and tacrolimus.  Atrial fibrillation Continue Coumadin. INR Therapeutic. Rate controlled on metoprolol.     DM type 2 causing renal disease ([place]) Poorly controlled. Increase Lantus to 10 units at bedtime and switch to moderate sliding scale coverage.  Elevated troponin Mild. Possibly due to acute CHF. No chest pain symptoms.  Coronary artery disease Continue beta blocker.     Code Status : Full code  Family Communication  : None at bedside  Disposition Plan  : Home once improved, possibly in the next 2540 hours  Barriers For Discharge : Active symptoms  Consults  :   Vascular surgery  Procedures  :  2-D echo Doppler left upper extremity  DVT Prophylaxis  :  Coumadin   Lab Results  Component Value Date   PLT 241 12/29/2016    Antibiotics  :    Anti-infectives    Start     Dose/Rate Route Frequency Ordered Stop   12/28/16 1000  dapsone tablet 25 mg     25 mg Oral Daily 12/27/16 2143          Objective:   Vitals:   12/28/16 2245 12/29/16 0100 12/29/16 0341 12/29/16 1138  BP: (!) 158/57 (!) 159/48 (!) 159/48 (!) 137/55  Pulse: 65 60 60 69  Resp: 18 17 17 18   Temp: 99.1 F (37.3 C) 98.2 F (36.8 C) 97.4 F (36.3 C) 98.8 F (37.1 C)  TempSrc: Oral Oral Oral Oral  SpO2: 100%  98% 94% 90%  Weight:   79.7 kg (175 lb 12.8 oz)   Height:        Wt Readings from Last 3 Encounters:  12/29/16 79.7 kg (175 lb 12.8 oz)  11/22/16 78.2 kg (172 lb 6.4 oz)  11/16/16 76.2 kg (168 lb 1.3 oz)     Intake/Output Summary (Last 24 hours) at 12/29/16 1604 Last data filed at 12/29/16 0801  Gross per 24 hour  Intake              410 ml  Output             2700 ml  Net            -2290 ml     Physical Exam  Gen: not in distress HEENT:  moist mucosa, supple neck, No JVD  Chest: clear b/l, no added sounds, enlarged left breast with enlargement and heaviness, nontender , no discharge CVS: Loud S1, normal S2, no murmurs rub or gallop GI: soft, NT, ND, BS+ Musculoskeletal: warm, 1+ pitting edema bilaterally, right knee swollen with mild tenderness and limited ROM.     Data Review:    CBC  Recent Labs Lab 12/27/16 1551 12/28/16 0355 12/29/16 0251  WBC 10.5 7.3 7.2  HGB 12.0 10.6* 10.6*  HCT 39.5 35.6* 36.0  PLT 274 231 241  MCV 79.5 79.6 80.0  MCH 24.1* 23.7* 23.6*  MCHC 30.4 29.8* 29.4*  RDW 17.7* 17.9* 17.9*    Chemistries   Recent Labs Lab 12/27/16 1551 12/28/16 0355 12/29/16 0251  NA 138 138 137  K 3.7 3.0* 3.7  CL 101 100* 97*  CO2 30 32 32  GLUCOSE 224* 237* 251*  BUN 18 19 26*  CREATININE 1.22* 1.37* 1.63*  CALCIUM 9.8 9.1 8.8*   ------------------------------------------------------------------------------------------------------------------ No results for input(s): CHOL, HDL, LDLCALC, TRIG,  CHOLHDL, LDLDIRECT in the last 72 hours.  Lab Results  Component Value Date   HGBA1C 8.4 (H) 11/18/2016   ------------------------------------------------------------------------------------------------------------------ No results for input(s): TSH, T4TOTAL, T3FREE, THYROIDAB in the last 72 hours.  Invalid input(s): FREET3 ------------------------------------------------------------------------------------------------------------------ No  results for input(s): VITAMINB12, FOLATE, FERRITIN, TIBC, IRON, RETICCTPCT in the last 72 hours.  Coagulation profile  Recent Labs Lab 12/27/16 1551 12/28/16 0355 12/29/16 0251  INR 2.29 2.40 2.80     Recent Labs  12/27/16 1551  DDIMER 0.45    Cardiac Enzymes  Recent Labs Lab 12/27/16 2341 12/28/16 0355 12/28/16 0933  TROPONINI 0.06* 0.06* 0.06*   ------------------------------------------------------------------------------------------------------------------    Component Value Date/Time   BNP 1,653.2 (H) 12/27/2016 1551    Inpatient Medications  Scheduled Meds: . amLODipine  5 mg Oral Daily  . dapsone  25 mg Oral Daily  . furosemide  60 mg Intravenous Q12H  . gabapentin  100 mg Oral BID  . insulin aspart  0-15 Units Subcutaneous TID WC  . insulin aspart  0-5 Units Subcutaneous QHS  . [START ON 12/30/2016] insulin glargine  10 Units Subcutaneous Daily  . levothyroxine  50 mcg Oral QAC breakfast  . mouth rinse  15 mL Mouth Rinse BID  . metoprolol tartrate  25 mg Oral BID  . multivitamin with minerals  1 tablet Oral Q breakfast  . mycophenolate  360 mg Oral BID  . predniSONE  40 mg Oral Q breakfast  . sodium chloride flush  3 mL Intravenous Q12H  . tacrolimus  4 mg Oral BID  . warfarin  5 mg Oral Once per day on Sun Tue Wed Thu Sat  . [START ON 12/31/2016] warfarin  7.5 mg Oral Once per day on Mon Fri  . Warfarin - Pharmacist Dosing Inpatient   Does not apply q1800   Continuous Infusions: . sodium chloride     PRN Meds:.sodium chloride, acetaminophen, dextromethorphan-guaiFENesin, diphenhydrAMINE, hydrALAZINE, ondansetron (ZOFRAN) IV, oxyCODONE-acetaminophen, polyethylene glycol, sodium chloride flush  Micro Results No results found for this or any previous visit (from the past 240 hour(s)).  Radiology Reports Dg Chest 2 View  Result Date: 12/27/2016 CLINICAL DATA:  Generalized chest pain with left breast and left arm swelling. Two weeks of cough. Chest  pain has a pleuritic component. History of previous CVA and MI, end-stage renal disease. EXAM: CHEST  2 VIEW COMPARISON:  Chest x-ray of May 14, 2013 and CT scan chest of November 10, 2015. FINDINGS: The lungs are reasonably well inflated. The pulmonary interstitial markings are increased. The pulmonary vascularity is engorged. The cardiac silhouette is enlarged. The patient has undergone previous median sternotomy, aortic valve replacement, Maze procedure, and left atrial appendage clip placement. The sternal wires appear intact. The observed bony thorax is unremarkable. IMPRESSION: CHF with pulmonary interstitial edema. No definite pneumonia nor pleural effusion. Electronically Signed   By: David  Martinique M.D.   On: 12/27/2016 16:25    Time Spent in minutes  25   Louellen Molder M.D on 12/29/2016 at 4:04 PM  Between 7am to 7pm - Pager - [phone]  After 7pm go to [email or web address] - password [facility]  [facility] -  Office  [phone]

## 2016-12-29 NOTE — Progress Notes (Signed)
ANTICOAGULATION CONSULT NOTE - Initial Consult  Pharmacy Consult for Warfarin Indication: atrial fibrillation  Allergies  Allergen Reactions  . Ibuprofen Nausea And Vomiting  . Sulfamethoxazole-Trimethoprim Itching, Swelling and Rash    Swelling of the face  . Sulfonamide Derivatives Itching, Swelling and Rash    Swelling of the face  . Tape Rash  . Tramadol Nausea And Vomiting  . Hydrocil [Psyllium] Nausea And Vomiting  . Bactrim Itching, Swelling and Rash  . Red Dye Itching and Rash    Patient Measurements: Height: 5\' 1"  (154.9 cm) Weight: 175 lb 12.8 oz (79.7 kg) (Scale A) IBW/kg (Calculated) : 47.8  Vital Signs: Temp: 97.4 F (36.3 C) (06/13 0341) Temp Source: Oral (06/13 0341) BP: 159/48 (06/13 0341) Pulse Rate: 60 (06/13 0341)  Labs:  Recent Labs  12/27/16 1551 12/27/16 2341 12/28/16 0355 12/28/16 0933 12/29/16 0251  HGB 12.0  --  10.6*  --  10.6*  HCT 39.5  --  35.6*  --  36.0  PLT 274  --  231  --  241  LABPROT 25.7*  --  26.6*  --  30.1*  INR 2.29  --  2.40  --  2.80  CREATININE 1.22*  --  1.37*  --  1.63*  TROPONINI  --  0.06* 0.06* 0.06*  --     Estimated Creatinine Clearance: 29.8 mL/min (A) (by C-G formula based on SCr of 1.63 mg/dL (H)).  Assessment: 71yo female with history of afib on warfarin, CVA, CAD, and renal transplant in 2012 presents with CP, SOB, breast pain and leg pain. Pharmacy is consulted to dose warfarin for atrial fibrillation. INR 2.8 today, CBC stable, No bleeding noted per chart.  PTA Warfarin Dose: 7.5mg  Mon/Fri and 5mg  AODs  Goal of Therapy:  INR 2-3 Monitor platelets by anticoagulation protocol: Yes   Plan:  Continue Warfarin home dose 7.5 mg on Mon and Fri, 5mg  all other days  Daily INR Monitor s/sx of bleeding  Maryanna Shape, PharmD, BCPS  Clinical Pharmacist  Pager: [phone]   12/29/2016,9:42 AM

## 2016-12-29 NOTE — Progress Notes (Signed)
Patient with no complaints or concerns during 7pm - 7am shift.  Jaki Steptoe, RN 

## 2016-12-30 DIAGNOSIS — E0865 Diabetes mellitus due to underlying condition with hyperglycemia: Secondary | ICD-10-CM

## 2016-12-30 LAB — GLUCOSE, CAPILLARY
GLUCOSE-CAPILLARY: 299 mg/dL — AB (ref 65–99)
Glucose-Capillary: 254 mg/dL — ABNORMAL HIGH (ref 65–99)
Glucose-Capillary: 234 mg/dL — ABNORMAL HIGH (ref 65–99)
Glucose-Capillary: 381 mg/dL — ABNORMAL HIGH (ref 65–99)

## 2016-12-30 LAB — BASIC METABOLIC PANEL
Anion gap: 10 (ref 5–15)
BUN: 29 mg/dL — ABNORMAL HIGH (ref 6–20)
CALCIUM: 9.6 mg/dL (ref 8.9–10.3)
CHLORIDE: 97 mmol/L — AB (ref 101–111)
CO2: 30 mmol/L (ref 22–32)
CREATININE: 1.69 mg/dL — AB (ref 0.44–1.00)
GFR calc non Af Amer: 29 mL/min — ABNORMAL LOW (ref >60)
GFR, EST AFRICAN AMERICAN: 34 mL/min — AB (ref >60)
Glucose, Bld: 301 mg/dL — ABNORMAL HIGH (ref 65–99)
Potassium: 3.9 mmol/L (ref 3.5–5.1)
SODIUM: 137 mmol/L (ref 135–145)

## 2016-12-30 LAB — PROTIME-INR
INR: 2.69
PROTHROMBIN TIME: 29.1 s — AB (ref 11.4–15.2)

## 2016-12-30 MED ORDER — INSULIN GLARGINE 100 UNIT/ML ~~LOC~~ SOLN
15.0000 [IU] | Freq: Every day | SUBCUTANEOUS | Status: DC
  Filled 2016-12-30: qty 0.15

## 2016-12-30 MED ORDER — INSULIN ASPART 100 UNIT/ML ~~LOC~~ SOLN
3.0000 [IU] | Freq: Three times a day (TID) | SUBCUTANEOUS | Status: DC
  Administered 2016-12-30 – 2017-01-01 (×5): 3 [IU] via SUBCUTANEOUS

## 2016-12-30 NOTE — Progress Notes (Signed)
Patient stable throughout the night., has productive cough.

## 2016-12-30 NOTE — Progress Notes (Signed)
PROGRESS NOTE                                                                                                                                                                                                             Patient Demographics:    [name], is a 71 y.o. female, DOB - 03-07-1946, WPY:[id number]  Admit date - 12/27/2016   Admitting Physician Ivor Costa, MD  Outpatient Primary MD for the patient is Plotnikov, [name], MD  LOS - 3  Outpatient Specialists:  Chief Complaint  Patient presents with  . Chest Pain  . Shortness of Breath  . Breast Pain  . Leg Pain       Brief Narrative   71 year old female with hypertension, hyperlipidemia, history of kidney transplant in 2012, chronic kidney disease stage III, OSA not on CPAP, A. fib on Coumadin, history of maze procedure, aortic stenosis with aortic valve replacement with bioprosthetic valve, CAD, history of stroke, diastolic CHF, who presented with increased shortness of breath of 4 days duration with bilateral leg edema. She also noticed increased swelling and heaviness of her left breast along with swelling of her left arm of about 2 weeks duration. In the ED she was in hypertensive crisis with blood pressure of 215/91 mmHg which improved after receiving IV labetalol. Also had positive JVD with pulmonary edema on chest x-ray and elevated BNP of 1653. Patient admitted for acute on chronic diastolic CHF.     Subjective:   Breathing continues to improve. Left arm and breast swelling improved.   Assessment  & Plan :   Principal Problem:   Acute respiratory failure secondary to Acute on chronic diastolic CHF (congestive heart failure) ([place]) Diuresing well but still requiring 2 L oxygen via nasal cannula. Continue current dose of IV Lasix. strict I/O and daily weight. 2-D echo shows EF of 05-39% with diastolic dysfunction.  Active Problems: Hypertensive  crisis Improved with IV labetalol. Amlodipine dose increased and resume home dose metoprolol. Currently stable.  Left forearm and left breast swelling Nontender and exam. Venous Doppler negative for DVT. Has chronic AV fistula in her left arm. No recent procedure or injury. Vascular surgery consult appreciated. 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 Plan on graft ligation to see for improvement. Recommend outpatient once her CHF it is more stable and patient needs to  be off Coumadin.  Acute gouty arthritis or right knee On oral prednisone. Improving.  History of renal transplant and chronic kidney disease stage III Mild worsening from baseline. (1.3-1.6). Monitor with diuresis. Continue home mycophenolate and tacrolimus.  Atrial fibrillation Continue Coumadin. INR Therapeutic. Rate controlled on metoprolol.     DM type 2 causing renal disease ([place]) Poorly controlled. Increase Lantus further to 15 units at bedtime and pre-meal aspart.  Elevated troponin Mild. Possibly due to acute CHF. No chest pain symptoms.  Coronary artery disease Continue beta blocker.     Code Status : Full code  Family Communication  : None at bedside  Disposition Plan  : Home possibly 24- 48 hours if continues to diurese well.  Barriers For Discharge : Active symptoms  Consults  :   Vascular surgery  Procedures  :  2-D echo Doppler left upper extremity  DVT Prophylaxis  :  Coumadin   Lab Results  Component Value Date   PLT 241 12/29/2016    Antibiotics  :    Anti-infectives    Start     Dose/Rate Route Frequency Ordered Stop   12/28/16 1000  dapsone tablet 25 mg     25 mg Oral Daily 12/27/16 2143          Objective:   Vitals:   12/29/16 1138 12/29/16 2034 12/30/16 0500 12/30/16 0631  BP: (!) 137/55 128/75  117/69  Pulse: 69 93  78  Resp: 18 19  18   Temp: 98.8 F (37.1 C) 98.5 F (36.9 C)  99.6 F (37.6 C)  TempSrc: Oral Oral  Oral  SpO2: 90% 93%   94%  Weight:   76.5 kg (168 lb 11.2 oz)   Height:        Wt Readings from Last 3 Encounters:  12/30/16 76.5 kg (168 lb 11.2 oz)  11/22/16 78.2 kg (172 lb 6.4 oz)  11/16/16 76.2 kg (168 lb 1.3 oz)     Intake/Output Summary (Last 24 hours) at 12/30/16 1214 Last data filed at 12/30/16 1000  Gross per 24 hour  Intake              723 ml  Output             2800 ml  Net            -2077 ml     Physical Exam Gen.: Elderly female not in distress HEENT: Is supple Chest: Clear bilaterally, improved enlargement of the left breast, non-tender CNS: Loud S1, normal S2, no murmurs GI: Soft, nondistended, nontender Musculoskeletal: Warm, 1+ pitting edema bilaterally, improved right knee swelling and nontender. Swelling over left forearm improved.     Data Review:    CBC  Recent Labs Lab 12/27/16 1551 12/28/16 0355 12/29/16 0251  WBC 10.5 7.3 7.2  HGB 12.0 10.6* 10.6*  HCT 39.5 35.6* 36.0  PLT 274 231 241  MCV 79.5 79.6 80.0  MCH 24.1* 23.7* 23.6*  MCHC 30.4 29.8* 29.4*  RDW 17.7* 17.9* 17.9*    Chemistries   Recent Labs Lab 12/27/16 1551 12/28/16 0355 12/29/16 0251 12/30/16 0148  NA 138 138 137 137  K 3.7 3.0* 3.7 3.9  CL 101 100* 97* 97*  CO2 30 32 32 30  GLUCOSE 224* 237* 251* 301*  BUN 18 19 26* 29*  CREATININE 1.22* 1.37* 1.63* 1.69*  CALCIUM 9.8 9.1 8.8* 9.6   ------------------------------------------------------------------------------------------------------------------ No results for input(s): CHOL, HDL, LDLCALC, TRIG, CHOLHDL, LDLDIRECT in the last 72 hours.  Lab Results  Component Value Date   HGBA1C 8.4 (H) 11/18/2016   ------------------------------------------------------------------------------------------------------------------ No results for input(s): TSH, T4TOTAL, T3FREE, THYROIDAB in the last 72 hours.  Invalid input(s):  FREET3 ------------------------------------------------------------------------------------------------------------------ No results for input(s): VITAMINB12, FOLATE, FERRITIN, TIBC, IRON, RETICCTPCT in the last 72 hours.  Coagulation profile  Recent Labs Lab 12/27/16 1551 12/28/16 0355 12/29/16 0251 12/30/16 0148  INR 2.29 2.40 2.80 2.69     Recent Labs  12/27/16 1551  DDIMER 0.45    Cardiac Enzymes  Recent Labs Lab 12/27/16 2341 12/28/16 0355 12/28/16 0933  TROPONINI 0.06* 0.06* 0.06*   ------------------------------------------------------------------------------------------------------------------    Component Value Date/Time   BNP 1,653.2 (H) 12/27/2016 1551    Inpatient Medications  Scheduled Meds: . amLODipine  5 mg Oral Daily  . dapsone  25 mg Oral Daily  . furosemide  60 mg Intravenous Q12H  . gabapentin  100 mg Oral BID  . insulin aspart  0-15 Units Subcutaneous TID WC  . insulin aspart  0-5 Units Subcutaneous QHS  . insulin glargine  10 Units Subcutaneous Daily  . levothyroxine  50 mcg Oral QAC breakfast  . mouth rinse  15 mL Mouth Rinse BID  . metoprolol tartrate  25 mg Oral BID  . multivitamin with minerals  1 tablet Oral Q breakfast  . mycophenolate  360 mg Oral BID  . predniSONE  40 mg Oral Q breakfast  . sodium chloride flush  3 mL Intravenous Q12H  . tacrolimus  4 mg Oral BID  . warfarin  5 mg Oral Once per day on Sun Tue Wed Thu Sat  . [START ON 12/31/2016] warfarin  7.5 mg Oral Once per day on Mon Fri  . Warfarin - Pharmacist Dosing Inpatient   Does not apply q1800   Continuous Infusions: . sodium chloride     PRN Meds:.sodium chloride, acetaminophen, dextromethorphan-guaiFENesin, diphenhydrAMINE, hydrALAZINE, ondansetron (ZOFRAN) IV, oxyCODONE-acetaminophen, polyethylene glycol, sodium chloride flush  Micro Results No results found for this or any previous visit (from the past 240 hour(s)).  Radiology Reports Dg Chest 2  View  Result Date: 12/27/2016 CLINICAL DATA:  Generalized chest pain with left breast and left arm swelling. Two weeks of cough. Chest pain has a pleuritic component. History of previous CVA and MI, end-stage renal disease. EXAM: CHEST  2 VIEW COMPARISON:  Chest x-ray of May 14, 2013 and CT scan chest of November 10, 2015. FINDINGS: The lungs are reasonably well inflated. The pulmonary interstitial markings are increased. The pulmonary vascularity is engorged. The cardiac silhouette is enlarged. The patient has undergone previous median sternotomy, aortic valve replacement, Maze procedure, and left atrial appendage clip placement. The sternal wires appear intact. The observed bony thorax is unremarkable. IMPRESSION: CHF with pulmonary interstitial edema. No definite pneumonia nor pleural effusion. Electronically Signed   By: David  Martinique M.D.   On: 12/27/2016 16:25    Time Spent in minutes  25   Louellen Molder M.D on 12/30/2016 at 12:14 PM  Between 7am to 7pm - Pager - [phone]  After 7pm go to [email or web address] - password [facility]  [facility] -  Office  [phone]

## 2016-12-30 NOTE — Progress Notes (Signed)
Inpatient Diabetes Program Recommendations  AACE/ADA: New Consensus Statement on Inpatient Glycemic Control (2015)  Target Ranges:  Prepandial:   less than 140 mg/dL      Peak postprandial:   less than 180 mg/dL (1-2 hours)      Critically ill patients:  140 - 180 mg/dL   Results for SADY, MONACO (MRN [id number]) as of 12/30/2016 10:00  Ref. Range 12/29/2016 07:41 12/29/2016 11:08 12/29/2016 16:26 12/29/2016 21:46 12/30/2016 07:37  Glucose-Capillary Latest Ref Range: 65 - 99 mg/dL 193 (H) 282 (H) 243 (H) 330 (H) 254 (H)   Review of Glycemic Control  Current orders for Inpatient glycemic control: Lantus 10 units daily, Novolog 0-15 units TID with meals, Novolog 0-5 units QHS  Inpatient Diabetes Program Recommendations:  Insulin - Basal: Noted Lantus increased to 10 units daily. Insulin - Meal Coverage: If steroids are continued, please consider ordering Novolog 3 units TID wtih meals for meal coverage if patient eats at least 50% of meals.  Thanks, Barnie Alderman, RN, MSN, CDE Diabetes Coordinator Inpatient Diabetes Program [phone] (Team Pager from 8am to 5pm)

## 2016-12-30 NOTE — Progress Notes (Signed)
ANTICOAGULATION CONSULT NOTE - Follow Up Consult  Pharmacy Consult for Warfarin Indication: atrial fibrillation  Allergies  Allergen Reactions  . Ibuprofen Nausea And Vomiting  . Sulfamethoxazole-Trimethoprim Itching, Swelling and Rash    Swelling of the face  . Sulfonamide Derivatives Itching, Swelling and Rash    Swelling of the face  . Tape Rash  . Tramadol Nausea And Vomiting  . Hydrocil [Psyllium] Nausea And Vomiting  . Bactrim Itching, Swelling and Rash  . Red Dye Itching and Rash    Patient Measurements: Height: 5\' 1"  (154.9 cm) Weight: 168 lb 11.2 oz (76.5 kg) (a scale) IBW/kg (Calculated) : 47.8  Vital Signs: Temp: 99.6 F (37.6 C) (06/14 0631) Temp Source: Oral (06/14 0631) BP: 117/69 (06/14 0631) Pulse Rate: 78 (06/14 0631)  Labs:  Recent Labs  12/27/16 1551 12/27/16 2341 12/28/16 0355 12/28/16 0933 12/29/16 0251 12/30/16 0148  HGB 12.0  --  10.6*  --  10.6*  --   HCT 39.5  --  35.6*  --  36.0  --   PLT 274  --  231  --  241  --   LABPROT 25.7*  --  26.6*  --  30.1* 29.1*  INR 2.29  --  2.40  --  2.80 2.69  CREATININE 1.22*  --  1.37*  --  1.63* 1.69*  TROPONINI  --  0.06* 0.06* 0.06*  --   --     Estimated Creatinine Clearance: 28.2 mL/min (A) (by C-G formula based on SCr of 1.69 mg/dL (H)).  Assessment: 71yo female with history of afib on warfarin, CVA, CAD, and renal transplant in 2012 presents with CP, SOB, breast pain and leg pain. Pharmacy is consulted to dose warfarin for atrial fibrillation. INR 2.7 today, CBC stable, No bleeding noted.  PTA Warfarin Dose: 7.5mg  Mon/Fri and 5mg  AODs  Goal of Therapy:  INR 2-3 Monitor platelets by anticoagulation protocol: Yes   Plan:  Continue Warfarin home dose 7.5 mg on Mon and Fri, 5mg  all other days  Daily INR Monitor s/sx of bleeding  Bonnita Nasuti Pharm.D. CPP, BCPS Clinical Pharmacist [phone] 12/30/2016 12:22 PM

## 2016-12-30 NOTE — Progress Notes (Signed)
Patient able to use BSC. And ambulatory to the bathroom. Refused bed alarm. No signs and symptoms of distress noted.

## 2016-12-31 DIAGNOSIS — I5033 Acute on chronic diastolic (congestive) heart failure: Secondary | ICD-10-CM

## 2016-12-31 DIAGNOSIS — I481 Persistent atrial fibrillation: Secondary | ICD-10-CM

## 2016-12-31 DIAGNOSIS — J9601 Acute respiratory failure with hypoxia: Secondary | ICD-10-CM

## 2016-12-31 LAB — GLUCOSE, CAPILLARY
GLUCOSE-CAPILLARY: 197 mg/dL — AB (ref 65–99)
GLUCOSE-CAPILLARY: 290 mg/dL — AB (ref 65–99)
Glucose-Capillary: 371 mg/dL — ABNORMAL HIGH (ref 65–99)
Glucose-Capillary: 166 mg/dL — ABNORMAL HIGH (ref 65–99)

## 2016-12-31 LAB — PROTIME-INR
INR: 3.03
PROTHROMBIN TIME: 32 s — AB (ref 11.4–15.2)

## 2016-12-31 LAB — BASIC METABOLIC PANEL
ANION GAP: 10 (ref 5–15)
BUN: 37 mg/dL — AB (ref 6–20)
CALCIUM: 9.5 mg/dL (ref 8.9–10.3)
CO2: 32 mmol/L (ref 22–32)
Chloride: 95 mmol/L — ABNORMAL LOW (ref 101–111)
Creatinine, Ser: 1.65 mg/dL — ABNORMAL HIGH (ref 0.44–1.00)
GFR calc Af Amer: 35 mL/min — ABNORMAL LOW (ref >60)
GFR, EST NON AFRICAN AMERICAN: 30 mL/min — AB (ref >60)
GLUCOSE: 213 mg/dL — AB (ref 65–99)
POTASSIUM: 3.2 mmol/L — AB (ref 3.5–5.1)
SODIUM: 137 mmol/L (ref 135–145)

## 2016-12-31 MED ORDER — WARFARIN SODIUM 5 MG PO TABS
5.0000 mg | ORAL_TABLET | Freq: Once | ORAL | Status: AC
  Administered 2016-12-31: 5 mg via ORAL
  Filled 2016-12-31: qty 1

## 2016-12-31 MED ORDER — PREDNISONE 20 MG PO TABS
20.0000 mg | ORAL_TABLET | Freq: Every day | ORAL | Status: DC
  Administered 2017-01-01: 20 mg via ORAL
  Filled 2016-12-31: qty 1

## 2016-12-31 MED ORDER — INSULIN GLARGINE 100 UNIT/ML ~~LOC~~ SOLN
22.0000 [IU] | Freq: Every day | SUBCUTANEOUS | Status: DC
  Administered 2016-12-31 – 2017-01-01 (×2): 22 [IU] via SUBCUTANEOUS
  Filled 2016-12-31 (×2): qty 0.22

## 2016-12-31 MED ORDER — INSULIN GLARGINE 100 UNIT/ML ~~LOC~~ SOLN: 8.0000 [IU] | Freq: Once | SUBCUTANEOUS | Status: DC

## 2016-12-31 MED ORDER — POTASSIUM CHLORIDE CRYS ER 20 MEQ PO TBCR
40.0000 meq | EXTENDED_RELEASE_TABLET | Freq: Two times a day (BID) | ORAL | Status: AC
  Administered 2016-12-31 – 2017-01-01 (×3): 40 meq via ORAL
  Filled 2016-12-31 (×3): qty 2

## 2016-12-31 NOTE — Progress Notes (Signed)
ANTICOAGULATION CONSULT NOTE - Follow Up Consult  Pharmacy Consult for Warfarin Indication: atrial fibrillation  Allergies  Allergen Reactions  . Ibuprofen Nausea And Vomiting  . Sulfamethoxazole-Trimethoprim Itching, Swelling and Rash    Swelling of the face  . Sulfonamide Derivatives Itching, Swelling and Rash    Swelling of the face  . Tape Rash  . Tramadol Nausea And Vomiting  . Hydrocil [Psyllium] Nausea And Vomiting  . Bactrim Itching, Swelling and Rash  . Red Dye Itching and Rash    Patient Measurements: Height: 5\' 1"  (154.9 cm) Weight: 166 lb 9.6 oz (75.6 kg) IBW/kg (Calculated) : 47.8  Vital Signs: Temp: 98.1 F (36.7 C) (06/15 0436) Temp Source: Oral (06/15 0436) BP: 138/92 (06/15 0436) Pulse Rate: 79 (06/15 0436)  Labs:  Recent Labs  12/28/16 0933 12/29/16 0251 12/30/16 0148 12/31/16 0346  HGB  --  10.6*  --   --   HCT  --  36.0  --   --   PLT  --  241  --   --   LABPROT  --  30.1* 29.1* 32.0*  INR  --  2.80 2.69 3.03  CREATININE  --  1.63* 1.69* 1.65*  TROPONINI 0.06*  --   --   --     Estimated Creatinine Clearance: 28.7 mL/min (A) (by C-G formula based on SCr of 1.65 mg/dL (H)).  Assessment: 71yo female with history of afib on warfarin, CVA, CAD, and renal transplant in 2012 presents with CP, SOB, breast pain and leg pain. Pharmacy is consulted to dose warfarin for atrial fibrillation. INR 3.03 today (slightly above goal) No bleeding noted.  PTA Warfarin Dose: 7.5mg  Mon/Fri and 5mg  AODs  Goal of Therapy:  INR 2-3 Monitor platelets by anticoagulation protocol: Yes   Plan:  Normally gets 7.5 mg of Coumadin on Fridays, but will reduce dose slightly to 5 mg since INR just over goal range. Daily INR Monitor s/sx of bleeding  Uvaldo Rising, BCPS  Clinical Pharmacist Pager [phone]  12/31/2016 8:43 AM

## 2016-12-31 NOTE — Progress Notes (Signed)
PROGRESS NOTE                                                                                                                                                                                                             Patient Demographics:    [name], is a 71 y.o. female, DOB - [birth year], AOZ:[id number]  Admit date - 12/27/2016   Admitting Physician Ivor Costa, MD  Outpatient Primary MD for the patient is Plotnikov, [name], MD  LOS - 4  Outpatient Specialists:  Chief Complaint  Patient presents with  . Chest Pain  . Shortness of Breath  . Breast Pain  . Leg Pain       Brief Narrative   71 year old female with hypertension, hyperlipidemia, history of kidney transplant in 2012, chronic kidney disease stage III, OSA not on CPAP, A. fib on Coumadin, history of maze procedure, aortic stenosis with aortic valve replacement with bioprosthetic valve, CAD, history of stroke, diastolic CHF, who presented with increased shortness of breath of 4 days duration with bilateral leg edema. She also noticed increased swelling and heaviness of her left breast along with swelling of her left arm of about 2 weeks duration. In the ED she was in hypertensive crisis with blood pressure of 215/91 mmHg which improved after receiving IV labetalol. Also had positive JVD with pulmonary edema on chest x-ray and elevated BNP of 1653. Patient admitted for acute on chronic diastolic CHF.     Subjective:   Breathing continues to improve.Denies cp, sob , wants to go home    Assessment  & Plan :       Acute respiratory failure secondary to Acute on chronic diastolic CHF (congestive heart failure) ([place]) Diuresing well but still requiring 2 L oxygen via nasal cannula. Continue 60 mg IV Lasix as  Renal function stable, continue IV diuresis strict I/O and daily weight.Lost about 11 pounds since admission 2-D echo 12/28/16 showed EF of  55-60%  DM type 2 causing renal disease ([place]), uncontrolled likely secondary to steroids Poorly controlled. Increase Lantus to 22 units , continue pre-meal aspart.   Hypokalemia, replete  Hypothyroidism-continue levothyroxine, TSH 3.75 on 11/16/16   Hypertensive crisis Improved with IV labetalol. Amlodipine dose increased and resume home dose metoprolol. Currently stable.  Left forearm and left breast swelling Nontender and exam. Venous Doppler negative for  DVT. Has chronic AV fistula in her left arm. No recent procedure or injury. Vascular surgery consult appreciated. 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 Plan on graft ligation to see for improvement. Recommend outpatient once her CHF it is more stable and patient needs to be off Coumadin.  Acute gouty arthritis or right knee On oral prednisone. Taper steroids, reduce dose to 20 mg /day  History of renal transplant and chronic kidney disease stage III Mild worsening from baseline. (1.3-1.6). Monitor with diuresis. Continue home mycophenolate and tacrolimus.  Atrial fibrillation Continue Coumadin. INR Therapeutic. Rate controlled on metoprolol.       Elevated troponin Mild. Possibly due to acute CHF. No chest pain symptoms.  Coronary artery disease Continue beta blocker.     Code Status : Full code  Family Communication  : None at bedside  Disposition Plan  : Home possibly 1-2 days  Barriers For Discharge :  multiple issues as above  Consults  :   Vascular surgery  Procedures  :  2-D echo Doppler left upper extremity  DVT Prophylaxis  :  Coumadin   Lab Results  Component Value Date   PLT 241 12/29/2016    Antibiotics  :    Anti-infectives    Start     Dose/Rate Route Frequency Ordered Stop   12/28/16 1000  dapsone tablet 25 mg     25 mg Oral Daily 12/27/16 2143          Objective:   Vitals:   12/30/16 0500 12/30/16 0631 12/30/16 2100 12/31/16 0436  BP:  117/69 (!)  153/67 (!) 138/92  Pulse:  78 62 79  Resp:  18 18 18   Temp:  99.6 F (37.6 C) 98.3 F (36.8 C) 98.1 F (36.7 C)  TempSrc:  Oral Oral Oral  SpO2:  94% 100% 98%  Weight: 76.5 kg (168 lb 11.2 oz)   75.6 kg (166 lb 9.6 oz)  Height:        Wt Readings from Last 3 Encounters:  12/31/16 75.6 kg (166 lb 9.6 oz)  11/22/16 78.2 kg (172 lb 6.4 oz)  11/16/16 76.2 kg (168 lb 1.3 oz)     Intake/Output Summary (Last 24 hours) at 12/31/16 0818 Last data filed at 12/30/16 2204  Gross per 24 hour  Intake              243 ml  Output             2701 ml  Net            -2458 ml     Physical Exam Gen.: Elderly female not in distress HEENT: Is supple Chest: Clear bilaterally, improved enlargement of the left breast, non-tender CNS: Loud S1, normal S2, no murmurs GI: Soft, nondistended, nontender Musculoskeletal: Warm, 1+ pitting edema bilaterally, improved right knee swelling and nontender. Swelling over left forearm improved.     Data Review:    CBC  Recent Labs Lab 12/27/16 1551 12/28/16 0355 12/29/16 0251  WBC 10.5 7.3 7.2  HGB 12.0 10.6* 10.6*  HCT 39.5 35.6* 36.0  PLT 274 231 241  MCV 79.5 79.6 80.0  MCH 24.1* 23.7* 23.6*  MCHC 30.4 29.8* 29.4*  RDW 17.7* 17.9* 17.9*    Chemistries   Recent Labs Lab 12/27/16 1551 12/28/16 0355 12/29/16 0251 12/30/16 0148 12/31/16 0346  NA 138 138 137 137 137  K 3.7 3.0* 3.7 3.9 3.2*  CL 101 100* 97* 97* 95*  CO2 30 32 32 30  32  GLUCOSE 224* 237* 251* 301* 213*  BUN 18 19 26* 29* 37*  CREATININE 1.22* 1.37* 1.63* 1.69* 1.65*  CALCIUM 9.8 9.1 8.8* 9.6 9.5   ------------------------------------------------------------------------------------------------------------------ No results for input(s): CHOL, HDL, LDLCALC, TRIG, CHOLHDL, LDLDIRECT in the last 72 hours.  Lab Results  Component Value Date   HGBA1C 8.4 (H) 11/18/2016    ------------------------------------------------------------------------------------------------------------------ No results for input(s): TSH, T4TOTAL, T3FREE, THYROIDAB in the last 72 hours.  Invalid input(s): FREET3 ------------------------------------------------------------------------------------------------------------------ No results for input(s): VITAMINB12, FOLATE, FERRITIN, TIBC, IRON, RETICCTPCT in the last 72 hours.  Coagulation profile  Recent Labs Lab 12/27/16 1551 12/28/16 0355 12/29/16 0251 12/30/16 0148 12/31/16 0346  INR 2.29 2.40 2.80 2.69 3.03    No results for input(s): DDIMER in the last 72 hours.  Cardiac Enzymes  Recent Labs Lab 12/27/16 2341 12/28/16 0355 12/28/16 0933  TROPONINI 0.06* 0.06* 0.06*   ------------------------------------------------------------------------------------------------------------------    Component Value Date/Time   BNP 1,653.2 (H) 12/27/2016 1551    Inpatient Medications  Scheduled Meds: . amLODipine  5 mg Oral Daily  . dapsone  25 mg Oral Daily  . furosemide  60 mg Intravenous Q12H  . gabapentin  100 mg Oral BID  . insulin aspart  0-15 Units Subcutaneous TID WC  . insulin aspart  0-5 Units Subcutaneous QHS  . insulin aspart  3 Units Subcutaneous TID WC  . insulin glargine  15 Units Subcutaneous Daily  . levothyroxine  50 mcg Oral QAC breakfast  . mouth rinse  15 mL Mouth Rinse BID  . metoprolol tartrate  25 mg Oral BID  . multivitamin with minerals  1 tablet Oral Q breakfast  . mycophenolate  360 mg Oral BID  . predniSONE  40 mg Oral Q breakfast  . sodium chloride flush  3 mL Intravenous Q12H  . tacrolimus  4 mg Oral BID  . warfarin  5 mg Oral Once per day on Sun Tue Wed Thu Sat  . warfarin  7.5 mg Oral Once per day on Mon Fri  . Warfarin - Pharmacist Dosing Inpatient   Does not apply q1800   Continuous Infusions: . sodium chloride     PRN Meds:.sodium chloride, acetaminophen,  dextromethorphan-guaiFENesin, diphenhydrAMINE, hydrALAZINE, ondansetron (ZOFRAN) IV, oxyCODONE-acetaminophen, polyethylene glycol, sodium chloride flush  Micro Results No results found for this or any previous visit (from the past 240 hour(s)).  Radiology Reports Dg Chest 2 View  Result Date: 12/27/2016 CLINICAL DATA:  Generalized chest pain with left breast and left arm swelling. Two weeks of cough. Chest pain has a pleuritic component. History of previous CVA and MI, end-stage renal disease. EXAM: CHEST  2 VIEW COMPARISON:  Chest x-ray of May 14, 2013 and CT scan chest of November 10, 2015. FINDINGS: The lungs are reasonably well inflated. The pulmonary interstitial markings are increased. The pulmonary vascularity is engorged. The cardiac silhouette is enlarged. The patient has undergone previous median sternotomy, aortic valve replacement, Maze procedure, and left atrial appendage clip placement. The sternal wires appear intact. The observed bony thorax is unremarkable. IMPRESSION: CHF with pulmonary interstitial edema. No definite pneumonia nor pleural effusion. Electronically Signed   By: David  Martinique M.D.   On: 12/27/2016 16:25    Time Spent in minutes  25   Reyne Dumas M.D on 12/31/2016 at 8:18 AM  Between 7am to 7pm - Pager - [phone]  After 7pm go to [email or web address] - password [facility]  [facility] -  Office  [phone]

## 2017-01-01 LAB — CBC
HEMATOCRIT: 41.2 % (ref 36.0–46.0)
HEMOGLOBIN: 12.7 g/dL (ref 12.0–15.0)
MCH: 24.4 pg — AB (ref 26.0–34.0)
MCHC: 30.8 g/dL (ref 30.0–36.0)
MCV: 79.1 fL (ref 78.0–100.0)
Platelets: 306 10*3/uL (ref 150–400)
RBC: 5.21 MIL/uL — ABNORMAL HIGH (ref 3.87–5.11)
RDW: 18.1 % — ABNORMAL HIGH (ref 11.5–15.5)
WBC: 9.9 10*3/uL (ref 4.0–10.5)

## 2017-01-01 LAB — COMPREHENSIVE METABOLIC PANEL
ALBUMIN: 2.5 g/dL — AB (ref 3.5–5.0)
ALT: 14 U/L (ref 14–54)
ANION GAP: 8 (ref 5–15)
AST: 21 U/L (ref 15–41)
Alkaline Phosphatase: 90 U/L (ref 38–126)
BUN: 43 mg/dL — ABNORMAL HIGH (ref 6–20)
CO2: 35 mmol/L — AB (ref 22–32)
Calcium: 9.9 mg/dL (ref 8.9–10.3)
Chloride: 97 mmol/L — ABNORMAL LOW (ref 101–111)
Creatinine, Ser: 1.72 mg/dL — ABNORMAL HIGH (ref 0.44–1.00)
GFR calc Af Amer: 33 mL/min — ABNORMAL LOW (ref >60)
GFR calc non Af Amer: 29 mL/min — ABNORMAL LOW (ref >60)
GLUCOSE: 123 mg/dL — AB (ref 65–99)
POTASSIUM: 3.6 mmol/L (ref 3.5–5.1)
SODIUM: 140 mmol/L (ref 135–145)
Total Bilirubin: 0.2 mg/dL — ABNORMAL LOW (ref 0.3–1.2)
Total Protein: 6.1 g/dL — ABNORMAL LOW (ref 6.5–8.1)

## 2017-01-01 LAB — GLUCOSE, CAPILLARY
GLUCOSE-CAPILLARY: 240 mg/dL — AB (ref 65–99)
Glucose-Capillary: 156 mg/dL — ABNORMAL HIGH (ref 65–99)

## 2017-01-01 LAB — PROTIME-INR
INR: 3.05
PROTHROMBIN TIME: 32.2 s — AB (ref 11.4–15.2)

## 2017-01-01 MED ORDER — WARFARIN SODIUM 5 MG PO TABS: 5.0000 mg | ORAL_TABLET | Freq: Once | ORAL | Status: DC

## 2017-01-01 MED ORDER — AMLODIPINE BESYLATE 5 MG PO TABS: 5.0000 mg | ORAL_TABLET | Freq: Every day | ORAL | 1 refills | Status: DC

## 2017-01-01 MED ORDER — POTASSIUM CHLORIDE CRYS ER 20 MEQ PO TBCR: 40.0000 meq | EXTENDED_RELEASE_TABLET | Freq: Every day | ORAL | 0 refills | Status: DC

## 2017-01-01 MED ORDER — FUROSEMIDE 20 MG PO TABS: 60.0000 mg | ORAL_TABLET | Freq: Every day | ORAL | 11 refills | Status: DC

## 2017-01-01 NOTE — Progress Notes (Signed)
ANTICOAGULATION CONSULT NOTE - Follow Up Consult  Pharmacy Consult for Warfarin Indication: atrial fibrillation  Allergies  Allergen Reactions  . Ibuprofen Nausea And Vomiting  . Sulfamethoxazole-Trimethoprim Itching, Swelling and Rash    Swelling of the face  . Sulfonamide Derivatives Itching, Swelling and Rash    Swelling of the face  . Tape Rash  . Tramadol Nausea And Vomiting  . Hydrocil [Psyllium] Nausea And Vomiting  . Bactrim Itching, Swelling and Rash  . Red Dye Itching and Rash    Patient Measurements: Height: 5\' 1"  (154.9 cm) Weight: 163 lb (73.9 kg) (Scale A) IBW/kg (Calculated) : 47.8  Vital Signs: Temp: 98.7 F (37.1 C) (06/16 0510) Temp Source: Oral (06/16 0510) BP: 131/55 (06/16 0904) Pulse Rate: 60 (06/16 0904)  Labs:  Recent Labs  12/30/16 0148 12/31/16 0346 01/01/17 0344  HGB  --   --  12.7  HCT  --   --  41.2  PLT  --   --  306  LABPROT 29.1* 32.0* 32.2*  INR 2.69 3.03 3.05  CREATININE 1.69* 1.65* 1.72*    Estimated Creatinine Clearance: 27.2 mL/min (A) (by C-G formula based on SCr of 1.72 mg/dL (H)).  Assessment: 71yo female with history of afib on warfarin, CVA, CAD, and renal transplant in 2012 presents with CP, SOB, breast pain and leg pain. Pharmacy is consulted to dose warfarin for atrial fibrillation. INR 3.05 today (slightly above goal). No bleeding noted.  PTA Warfarin Dose: 7.5mg  Mon/Fri and 5mg  AODs  Goal of Therapy:  INR 2-3 Monitor platelets by anticoagulation protocol: Yes   Plan:  Warfarin 5mg  PO x1 tonight  Daily INR Monitor s/sx of bleeding  Argie Ramming, PharmD Pharmacy Resident  Pager [phone] 01/01/17 10:28 AM

## 2017-01-01 NOTE — Discharge Summary (Signed)
Physician Discharge Summary  TELSA DILLAVOU MRN: [id number] DOB/AGE: Sep 30, 1945 71 y.o.  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   S/P Maze operation for atrial fibrillation   S/P AVR (aortic valve replacement)   Acute respiratory failure with hypoxia (HCC)   Left arm swelling   CKD (chronic kidney disease), stage III   Hypertensive urgency   Pain of left breast   CHF exacerbation (HCC)   Diabetes mellitus due to underlying condition with hyperglycemia, without long-term current use of insulin ([place])    Follow-up recommendations Follow-up with PCP in 3-5 days , including all  additional recommended appointments as below Follow-up CBC, CMP in 3-5 days Patient would need PT/INR checkup on 6/18      Current Discharge Medication List    START taking these medications   Details  furosemide (LASIX) 20 MG tablet Take 3 tablets (60 mg total) by mouth daily. Qty: 90 tablet, Refills: 11    potassium chloride SA (K-DUR,KLOR-CON) 20 MEQ tablet Take 2 tablets (40 mEq total) by mouth daily. Qty: 30 tablet, Refills: 0      CONTINUE these medications which have CHANGED   Details  amLODipine (NORVASC) 5 MG tablet Take 1 tablet (5 mg total) by mouth daily. Qty: 30 tablet, Refills: 1      CONTINUE these medications which have NOT CHANGED   Details  dapsone 25 MG tablet Take 25 mg by mouth daily.     diphenhydrAMINE (BENADRYL) 50 MG capsule Take 1 capsule (50 mg total) by mouth every 6 (six) hours as needed for itching. Qty: 90 capsule, Refills: 3    gabapentin  (NEURONTIN) 100 MG capsule Take 1 capsule (100 mg total) by mouth 3 (three) times daily. Qty: 270 capsule, Refills: 1    HUMALOG KWIKPEN 100 UNIT/ML KiwkPen IF SUGAR:140-200 USE 8 UNITS,201-230 10 UNITS,231-260 12 UNITS,261-300 14 UNITS,>301 16 UNITS Qty: 9 pen, Refills: 11    levothyroxine (SYNTHROID, LEVOTHROID) 25 MCG tablet TAKE 2 TABLETS (50 MCG TOTAL) BY MOUTH DAILY. Qty: 60 tablet, Refills: 11    metoprolol tartrate (LOPRESSOR) 25 MG tablet TAKE 1 TABLET BY MOUTH TWICE A DAY *NEEDS APPT FOR REFILLS Qty: 180 tablet, Refills: 0    Multiple Vitamins-Minerals (CENTRUM SILVER 50+WOMEN) TABS Take 1 tablet by mouth daily with breakfast.    mycophenolate (MYFORTIC) 180 MG EC tablet Take 360 mg by mouth 2 (two) times daily.     oxyCODONE-acetaminophen (ROXICET) 5-325 MG tablet Take 0.5-1 tablets by mouth every 6 (six) hours as needed for severe pain. Qty: 100 tablet, Refills: 0    tacrolimus (PROGRAF) 1 MG capsule Take 4 mg by mouth 2 (two) times daily.     warfarin (COUMADIN) 5 MG tablet Take 1-1.5 tablets by mouth daily as directed by coumadin clinic Qty: 120 tablet, Refills: 0    Alum & Mag Hydroxide-Simeth (MAGIC MOUTHWASH) SOLN Take 5 mLs by mouth 4 (four) times daily. Swish, hold and swallow Qty: 500 mL, Refills: 1    insulin degludec (TRESIBA FLEXTOUCH) 100 UNIT/ML SOPN FlexTouch Pen Inject 0.16 mLs (16 Units total) into the  skin daily. Qty: 5 pen, Refills: 1    Insulin Pen Needle 31G X 5 MM MISC Use to administer insulin four times a day Dx E11.9 Qty: 130 each, Refills: 5    ONETOUCH DELICA LANCETS 37G MISC Use to check blood sugars twice a day DX E11.9 Qty: 100 each, Refills: 5    ONETOUCH VERIO test strip USE AS INSTRUCTED 4 TIMES A DAY (BEFORE MEALS) & AT BEDTIME (ICD 10-E11.9) Qty: 150 each, Refills: 3      STOP taking these medications     amoxicillin (AMOXIL) 500 MG capsule      metFORMIN (GLUCOPHAGE-XR) 500 MG 24 hr tablet          Discharge Condition:  Stable   Discharge Instructions Get Medicines reviewed and adjusted: Please take all your medications with you for your next visit with your Primary MD  Please request your Primary MD to go over all hospital tests and procedure/radiological results at the follow up, please ask your Primary MD to get all Hospital records sent to his/her office.  If you experience worsening of your admission symptoms, develop shortness of breath, life threatening emergency, suicidal or homicidal thoughts you must seek medical attention immediately by calling 911 or calling your MD immediately if symptoms less severe.  You must read complete instructions/literature along with all the possible adverse reactions/side effects for all the Medicines you take and that have been prescribed to you. Take any new Medicines after you have completely understood and accpet all the possible adverse reactions/side effects.   Do not drive when taking Pain medications.   Do not take more than prescribed Pain, Sleep and Anxiety Medications  Special Instructions: If you have smoked or chewed Tobacco in the last 2 yrs please stop smoking, stop any regular Alcohol and or any Recreational drug use.  Wear Seat belts while driving.  Please note  You were cared for by a hospitalist during your hospital stay. Once you are discharged, your primary care physician will handle any further medical issues. Please note that NO REFILLS for any discharge medications will be authorized once you are discharged, as it is imperative that you return to your primary care physician (or establish a relationship with a primary care physician if you do not have one) for your aftercare needs so that they can reassess your need for medications and monitor your lab values.     Allergies  Allergen Reactions  . Ibuprofen Nausea And Vomiting  . Sulfamethoxazole-Trimethoprim Itching, Swelling and Rash    Swelling of the face  . Sulfonamide Derivatives  Itching, Swelling and Rash    Swelling of the face  . Tape Rash  . Tramadol Nausea And Vomiting  . Hydrocil [Psyllium] Nausea And Vomiting  . Bactrim Itching, Swelling and Rash  . Red Dye Itching and Rash      Disposition: 01-Home or Self Care   Consults: None    Significant Diagnostic Studies:  Dg Chest 2 View  Result Date: 12/27/2016 CLINICAL DATA:  Generalized chest pain with left breast and left arm swelling. Two weeks of cough. Chest pain has a pleuritic component. History of previous CVA and MI, end-stage renal disease. EXAM: CHEST  2 VIEW COMPARISON:  Chest x-ray of May 14, 2013 and CT scan chest of November 10, 2015. FINDINGS: The lungs are reasonably well inflated. The pulmonary interstitial markings are increased. The pulmonary vascularity is engorged. The cardiac silhouette is enlarged. The patient has undergone previous median sternotomy, aortic valve replacement,  Maze procedure, and left atrial appendage clip placement. The sternal wires appear intact. The observed bony thorax is unremarkable. IMPRESSION: CHF with pulmonary interstitial edema. No definite pneumonia nor pleural effusion. Electronically Signed   By: David  Martinique M.D.   On: 12/27/2016 16:25    echocardiogram  LV EF: 55% -   60%  ------------------------------------------------------------------- Indications:      CHF - 428.0.  ------------------------------------------------------------------- History:   PMH:   Atrial fibrillation.  PMH:   Myocardial infarction.  Risk factors:  Chronic kidney disease Diabetes mellitus.  ------------------------------------------------------------------- Study Conclusions  - Left ventricle: The cavity size was normal. Wall thickness was   increased in a pattern of mild LVH. Systolic function was normal.   The estimated ejection fraction was in the range of 55% to 60%.   Doppler parameters are consistent with both elevated ventricular   end-diastolic filling  pressure and elevated left atrial filling   pressure. - Aortic valve: There was moderate stenosis. - Mitral valve: Severely calcified, severely thickened annulus.   There was mild regurgitation. - Left atrium: The atrium was moderately dilated. - Atrial septum: No defect or patent foramen ovale was identified.  ------------------------------------------------------------------- Labs, prior tests, procedures, and surgery: S/p aortic valve replacement. S/p MAZE procedure for atrial fibrillation      Filed Weights   12/30/16 0500 12/31/16 0436 01/01/17 0510  Weight: 76.5 kg (168 lb 11.2 oz) 75.6 kg (166 lb 9.6 oz) 73.9 kg (163 lb)     Microbiology: No results found for this or any previous visit (from the past 240 hour(s)).     Blood Culture    Component Value Date/Time   SDES ABSCESS NECK 08/07/2015 2204   SPECREQUEST DISTAL CHIN 08/07/2015 2204   CULT  08/07/2015 2204    MODERATE METHICILLIN RESISTANT STAPHYLOCOCCUS AUREUS Note: RIFAMPIN AND GENTAMICIN SHOULD NOT BE USED AS SINGLE DRUGS FOR TREATMENT OF STAPH INFECTIONS. This organism DOES NOT demonstrate inducible Clindamycin resistance in vitro. CRITICAL RESULT CALLED TO, READ BACK BY AND VERIFIED WITH: LYNN M. AT 9:45AM  ON 08/10/2015 HAJAM Performed at [facility] 08/10/2015 FINAL 08/07/2015 2204      Labs: Results for orders placed or performed during the hospital encounter of 12/27/16 (from the past 48 hour(s))  Glucose, capillary     Status: Abnormal   Collection Time: 12/30/16 11:41 AM  Result Value Ref Range   Glucose-Capillary 234 (H) 65 - 99 mg/dL  Glucose, capillary     Status: Abnormal   Collection Time: 12/30/16  4:26 PM  Result Value Ref Range   Glucose-Capillary 381 (H) 65 - 99 mg/dL  Glucose, capillary     Status: Abnormal   Collection Time: 12/30/16  8:50 PM  Result Value Ref Range   Glucose-Capillary 299 (H) 65 - 99 mg/dL   Comment 1 Notify RN    Comment 2 Document  in Chart   Basic metabolic panel     Status: Abnormal   Collection Time: 12/31/16  3:46 AM  Result Value Ref Range   Sodium 137 135 - 145 mmol/L   Potassium 3.2 (L) 3.5 - 5.1 mmol/L    Comment: NO VISIBLE HEMOLYSIS   Chloride 95 (L) 101 - 111 mmol/L   CO2 32 22 - 32 mmol/L   Glucose, Bld 213 (H) 65 - 99 mg/dL   BUN 37 (H) 6 - 20 mg/dL   Creatinine, Ser 1.65 (H) 0.44 - 1.00 mg/dL   Calcium 9.5 8.9 - 10.3 mg/dL  GFR calc non Af Amer 30 (L) >60 mL/min   GFR calc Af Amer 35 (L) >60 mL/min    Comment: (NOTE) The eGFR has been calculated using the CKD EPI equation. This calculation has not been validated in all clinical situations. eGFR's persistently <60 mL/min signify possible Chronic Kidney Disease.    Anion gap 10 5 - 15  Protime-INR     Status: Abnormal   Collection Time: 12/31/16  3:46 AM  Result Value Ref Range   Prothrombin Time 32.0 (H) 11.4 - 15.2 seconds   INR 3.03   Glucose, capillary     Status: Abnormal   Collection Time: 12/31/16  7:56 AM  Result Value Ref Range   Glucose-Capillary 197 (H) 65 - 99 mg/dL   Comment 1 Notify RN   Glucose, capillary     Status: Abnormal   Collection Time: 12/31/16 11:45 AM  Result Value Ref Range   Glucose-Capillary 290 (H) 65 - 99 mg/dL   Comment 1 Notify RN   Glucose, capillary     Status: Abnormal   Collection Time: 12/31/16  4:18 PM  Result Value Ref Range   Glucose-Capillary 371 (H) 65 - 99 mg/dL   Comment 1 Notify RN   Glucose, capillary     Status: Abnormal   Collection Time: 12/31/16  9:17 PM  Result Value Ref Range   Glucose-Capillary 166 (H) 65 - 99 mg/dL   Comment 1 Notify RN    Comment 2 Document in Chart   Protime-INR     Status: Abnormal   Collection Time: 01/01/17  3:44 AM  Result Value Ref Range   Prothrombin Time 32.2 (H) 11.4 - 15.2 seconds   INR 3.05   Comprehensive metabolic panel     Status: Abnormal   Collection Time: 01/01/17  3:44 AM  Result Value Ref Range   Sodium 140 135 - 145 mmol/L    Potassium 3.6 3.5 - 5.1 mmol/L   Chloride 97 (L) 101 - 111 mmol/L   CO2 35 (H) 22 - 32 mmol/L   Glucose, Bld 123 (H) 65 - 99 mg/dL   BUN 43 (H) 6 - 20 mg/dL   Creatinine, Ser 1.72 (H) 0.44 - 1.00 mg/dL   Calcium 9.9 8.9 - 10.3 mg/dL   Total Protein 6.1 (L) 6.5 - 8.1 g/dL   Albumin 2.5 (L) 3.5 - 5.0 g/dL   AST 21 15 - 41 U/L   ALT 14 14 - 54 U/L   Alkaline Phosphatase 90 38 - 126 U/L   Total Bilirubin 0.2 (L) 0.3 - 1.2 mg/dL   GFR calc non Af Amer 29 (L) >60 mL/min   GFR calc Af Amer 33 (L) >60 mL/min    Comment: (NOTE) The eGFR has been calculated using the CKD EPI equation. This calculation has not been validated in all clinical situations. eGFR's persistently <60 mL/min signify possible Chronic Kidney Disease.    Anion gap 8 5 - 15  CBC     Status: Abnormal   Collection Time: 01/01/17  3:44 AM  Result Value Ref Range   WBC 9.9 4.0 - 10.5 K/uL   RBC 5.21 (H) 3.87 - 5.11 MIL/uL   Hemoglobin 12.7 12.0 - 15.0 g/dL   HCT 41.2 36.0 - 46.0 %   MCV 79.1 78.0 - 100.0 fL   MCH 24.4 (L) 26.0 - 34.0 pg   MCHC 30.8 30.0 - 36.0 g/dL   RDW 18.1 (H) 11.5 - 15.5 %   Platelets 306 150 -  400 K/uL  Glucose, capillary     Status: Abnormal   Collection Time: 01/01/17  7:36 AM  Result Value Ref Range   Glucose-Capillary 156 (H) 65 - 99 mg/dL   Comment 1 Notify RN    Comment 2 Document in Chart      Lipid Panel     Component Value Date/Time   CHOL 174 11/18/2016 1354   TRIG 131.0 11/18/2016 1354   TRIG 121 06/13/2006 1525   HDL 75.10 11/18/2016 1354   CHOLHDL 2 11/18/2016 1354   VLDL 26.2 11/18/2016 1354   LDLCALC 72 11/18/2016 1354   LDLDIRECT 89.3 06/13/2006 1525     Lab Results  Component Value Date   HGBA1C 8.4 (H) 11/18/2016   HGBA1C 8.5 (H) 11/16/2016   HGBA1C 9.3 06/29/2016       HPI :  71 year old female with hypertension, hyperlipidemia, history of kidney transplant in 2012, chronic kidney disease stage III, OSA not on CPAP, A. fib on Coumadin, history of maze  procedure, aortic stenosis with aortic valve replacement with bioprosthetic valve, CAD, history of stroke, diastolic CHF, who presented with increased shortness of breath of 4 days duration with bilateral leg edema. She also noticed increased swelling and heaviness of her left breast along with swelling of her left arm of about 2 weeks duration. In the ED she was in hypertensive crisis with blood pressure of 215/91 mmHg which improved after receiving IV labetalol. Also had positive JVD with pulmonary edema on chest x-ray and elevated BNP of 1653. Patient admitted for acute on chronic diastolic CHF.  HOSPITAL COURSE:   Acute respiratory failure secondary to Acute on chronic diastolic CHF (congestive heart failure) ([place]) Diuresing well , patient has been weaned off of oxygen. Received 60 mg IV Lasix every 12 hours, renal function relatively stable, Lasix switched to 60 mg in the morning  Lost about 11 pounds since admission 2-D echo 12/28/16 showed EF of 55-60%  DM type 2 causing renal disease ([place]), uncontrolled likely secondary to steroids Discontinue metformin due to creatinine of 1.6-1.7 Patient will continue with sliding scale insulin and Treseiba   Hypokalemia, repleted  Hypothyroidism-continue levothyroxine, TSH 3.75 on 11/16/16   Hypertensive crisis Required IV labetalol. Continue Norvasc, metoprolol. Currently stable.  Left forearm and left breast swelling Nontender and exam. Venous Doppler negative for DVT. Has chronic AV fistula in her left arm. No recent procedure or injury. Vascular surgery consult appreciated. 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 Plan on graft ligation to see for improvement. Recommend outpatient once her CHF it is more stable and patient needs to be off Coumadin.  Acute gouty arthritis or right knee Started on oral prednisone, discontinued prior to discharge due to resolution of symptoms  History of renal transplant and chronic kidney disease  stage III Mild worsening from baseline. (1.3-1.6). Creatinine 1.7 today. Monitor renal function closely Continue home mycophenolate and tacrolimus.  Atrial fibrillation Continue Coumadin. INR Therapeutic. Rate controlled on metoprolol. Repeat INR on 6/18    Elevated troponin Mild. Possibly due to acute CHF. No chest pain symptoms.  Coronary artery disease Continue beta blocker.      Discharge Exam: *  Blood pressure 124/79, pulse 65, temperature 98.7 F (37.1 C), temperature source Oral, resp. rate 17, height _0  (1.549 m), weight 73.9 kg (163 lb), SpO2 95 %.  Gen.: Elderly female not in distress HEENT: Is supple Chest: Clear bilaterally, improved enlargement of the left breast, non-tender CNS: Loud S1, normal S2, no murmurs GI:  Soft, nondistended, nontender Musculoskeletal: Warm, 1+ pitting edema bilaterally, improved right knee swelling and nontender. Swelling over left forearm improved    Follow-up Information    Plotnikov, [name], MD. Call.   Specialty:  Internal Medicine Why:  Hospital follow-up in 3-5 days Contact information: [place]  [id number] [phone]           Signed: Reyne Dumas 01/01/2017, 8:09 AM        Time spent >1 hour

## 2017-01-01 NOTE — Progress Notes (Signed)
Patient is discharge to home accompanied by patient's family member and staff  via wheelchair. Discharge instructions given . Patient verbalizes understanding. All personal belongings given. Telemetry box and IV removed prior to discharge and site in good condition.

## 2017-01-01 NOTE — Care Management Note (Signed)
Case Management Note  Patient Details  Name: SAMARY SHATZ MRN: [id number] Date of Birth: 06/04/1946  Subjective/Objective:                  Chest Pain Action/Plan: Discharge planning Expected Discharge Date:  01/01/17               Expected Discharge Plan:  Home/Self Care  In-House Referral:     Discharge planning Services  CM Consult  Post Acute Care Choice:  NA Choice offered to:  Patient  DME Arranged:  N/A DME Agency:  NA  HH Arranged:  NA HH Agency:  NA  Status of Service:  Completed, signed off  If discussed at [place] of Stay Meetings, dates discussed:    Additional Comments: CM spoke with pt who verbalized understanding she needs to have her INR checked at her PCPs (Plotnikov,MD) office 01/03/17.  Cm has placed this reminder on pt's AVS.  No other CM needs were communicated. Dellie Catholic, RN 01/01/2017, 11:02 AM

## 2017-01-03 ENCOUNTER — Telehealth: Payer: Self-pay | Admitting: Cardiology

## 2017-01-03 ENCOUNTER — Telehealth: Payer: Self-pay | Admitting: *Deleted

## 2017-01-03 NOTE — Telephone Encounter (Signed)
New Message  Pt call requesting to speak with coumadin about scheduling and appt. Pt states she does not know when she needs to make her next appt. Please call back to discuss

## 2017-01-03 NOTE — Telephone Encounter (Signed)
Transition Care Management Follow-up Telephone Call   Date discharged? 01/01/17   How have you been since you were released from the hospital? Pt states she is doing fine   Do you understand why you were in the hospital? YES   Do you understand the discharge instructions? YES   Where were you discharged to? Home   Items Reviewed:  Medications reviewed: YES  Allergies reviewed: YES  Dietary changes reviewed: YES  Referrals reviewed: No referral needed   Functional Questionnaire:  Activities of Daily Living (ADLs):   She states she are independent in the following: ambulation, bathing and hygiene, feeding, continence, grooming, toileting and dressing   States she doesn't require assistance    Any transportation issues/concerns?: NO   Any patient concerns? NO   Confirmed importance and date/time of follow-up visits scheduled YES, appt 01/07/17  Provider Appointment booked with Wilfred Lacy, NP  Confirmed with patient if condition begins to worsen call PCP or go to the ER.  Patient was given the office number and encouraged to call back with question or concerns.  : YES

## 2017-01-03 NOTE — Telephone Encounter (Signed)
Appointment set for Tuesday

## 2017-01-04 ENCOUNTER — Ambulatory Visit (INDEPENDENT_AMBULATORY_CARE_PROVIDER_SITE_OTHER): Payer: Medicare Other | Admitting: Pharmacist

## 2017-01-04 DIAGNOSIS — Z5181 Encounter for therapeutic drug level monitoring: Secondary | ICD-10-CM

## 2017-01-04 DIAGNOSIS — I4891 Unspecified atrial fibrillation: Secondary | ICD-10-CM

## 2017-01-04 LAB — POCT INR: INR: 3

## 2017-01-05 ENCOUNTER — Telehealth: Payer: Self-pay | Admitting: *Deleted

## 2017-01-05 NOTE — Telephone Encounter (Signed)
I have sent the following message to [facility];   Ms. Philippi is scheduled for a ligation of her left arm hemodialysis graft on 01-12-17 by Dr. Ruta Hinds. We are requesting that this patient stop her Coumadin 3 days prior (after 6-23 dose) and have a Lovenox bridge. Will you please arrange this for [place]?   Pt sees : Rodriguez-Guzman, Raquel, RPH at [facility]. Dr. [name]

## 2017-01-06 ENCOUNTER — Telehealth: Payer: Self-pay | Admitting: Pharmacist

## 2017-01-06 NOTE — Telephone Encounter (Signed)
-----   Message from Mena Goes, RN sent at 01/05/2017  5:31 PM EDT ----- Regarding: Stop Coumadin Ms. Hanzlik is scheduled for a ligation of her left arm hemodialysis graft on 01-12-17 by Dr. Ruta Hinds. We are requesting that this patient stop her Coumadin 3 days prior (after 6-23 dose) and have a Lovenox bridge. Will you please arrange this for [place]?

## 2017-01-06 NOTE — Telephone Encounter (Signed)
Called patient today to discuss bridge plan for procedure on 01/13/16 with Dr [name].  Patient informer me she changed her mind and the procedure is cancelled.  **Will current therapy without changes and follow up at coumadin clinic on 01/25/17**

## 2017-01-07 ENCOUNTER — Ambulatory Visit (INDEPENDENT_AMBULATORY_CARE_PROVIDER_SITE_OTHER)
Admission: RE | Admit: 2017-01-07 | Discharge: 2017-01-07 | Disposition: A | Discharge status: Home / Self Care | Payer: Medicare Other | Source: Ambulatory Visit | Attending: Nurse Practitioner | Admitting: Nurse Practitioner

## 2017-01-07 ENCOUNTER — Ambulatory Visit (INDEPENDENT_AMBULATORY_CARE_PROVIDER_SITE_OTHER): Payer: Medicare Other | Admitting: Nurse Practitioner

## 2017-01-07 ENCOUNTER — Other Ambulatory Visit (INDEPENDENT_AMBULATORY_CARE_PROVIDER_SITE_OTHER): Payer: Medicare Other

## 2017-01-07 ENCOUNTER — Encounter: Payer: Self-pay | Admitting: Nurse Practitioner

## 2017-01-07 VITALS — BP 150/66 | HR 75 | Temp 98.6°F | Ht 61.0 in | Wt 167.0 lb

## 2017-01-07 DIAGNOSIS — I5043 Acute on chronic combined systolic (congestive) and diastolic (congestive) heart failure: Secondary | ICD-10-CM

## 2017-01-07 DIAGNOSIS — I5032 Chronic diastolic (congestive) heart failure: Secondary | ICD-10-CM

## 2017-01-07 DIAGNOSIS — E042 Nontoxic multinodular goiter: Secondary | ICD-10-CM

## 2017-01-07 DIAGNOSIS — I509 Heart failure, unspecified: Secondary | ICD-10-CM | POA: Diagnosis not present

## 2017-01-07 LAB — COMPREHENSIVE METABOLIC PANEL
ALK PHOS: 85 U/L (ref 39–117)
ALT: 17 U/L (ref 0–35)
AST: 23 U/L (ref 0–37)
Albumin: 3.4 g/dL — ABNORMAL LOW (ref 3.5–5.2)
BILIRUBIN TOTAL: 0.4 mg/dL (ref 0.2–1.2)
BUN: 34 mg/dL — ABNORMAL HIGH (ref 6–23)
CO2: 32 mEq/L (ref 19–32)
CREATININE: 1.54 mg/dL — AB (ref 0.40–1.20)
Calcium: 10.5 mg/dL (ref 8.4–10.5)
Chloride: 104 mEq/L (ref 96–112)
GFR: 42.47 mL/min — AB (ref >60.00)
GLUCOSE: 128 mg/dL — AB (ref 70–99)
Potassium: 4.7 mEq/L (ref 3.5–5.1)
Sodium: 141 mEq/L (ref 135–145)
TOTAL PROTEIN: 6.6 g/dL (ref 6.0–8.3)

## 2017-01-07 LAB — CBC
HEMATOCRIT: 39.9 % (ref 36.0–46.0)
Hemoglobin: 12.4 g/dL (ref 12.0–15.0)
MCHC: 31.1 g/dL (ref 30.0–36.0)
MCV: 78.7 fl (ref 78.0–100.0)
PLATELETS: 249 10*3/uL (ref 150.0–400.0)
RBC: 5.07 Mil/uL (ref 3.87–5.11)
RDW: 19.3 % — ABNORMAL HIGH (ref 11.5–15.5)
WBC: 8.4 10*3/uL (ref 4.0–10.5)

## 2017-01-07 LAB — TSH: TSH: 2.08 u[IU]/mL (ref 0.35–4.50)

## 2017-01-07 LAB — T4, FREE: FREE T4: 0.87 ng/dL (ref 0.60–1.60)

## 2017-01-07 NOTE — Progress Notes (Signed)
Subjective:  [name], female    DOB: 1946-01-17  Age: 71 y.o. MRN: [id number]  CC: Hospitalization Follow-up [name] f/u--CHF--)   HPI Admission: 12/27/2016 Discharge:01/01/2017. [name] was hospitalized due to acute on chronic heart failure. Her stay at the hospital was complicated by hypertensive crisis, acute gout arthritis on right knee, left arm and breast swelling and hx of CKD,  A-fib, CAD, and DM. She was treated with IV lasix (11Lbs lost), IV labetalol, oral prednisone and metformin discontinued. Echocardiogram, left UE venous doppler and labs were performed. (results and reports reviewed)  She denies any acute symptoms since discharge. Reports improvement in breathing and SOB.  She is at home with husband and also gets help from great grandson.  Weighs self daily. States weight is stable at 166Lbs. Weight a discharge was 163Lbs. does not maintain low salt diet.  appt with cardiology 03/19/2017. appt with nephrology 01/17/2017.  Medication list reviewed and reconciled with patient. Outpatient Medications Prior to Visit  Medication Sig Dispense Refill  . Alum & Mag Hydroxide-Simeth (MAGIC MOUTHWASH) SOLN Take 5 mLs by mouth 4 (four) times daily. Swish, hold and swallow 500 mL 1  . amLODipine (NORVASC) 5 MG tablet Take 1 tablet (5 mg total) by mouth daily. 30 tablet 1  . dapsone 25 MG tablet Take 25 mg by mouth daily.     . diphenhydrAMINE (BENADRYL) 50 MG capsule Take 1 capsule (50 mg total) by mouth every 6 (six) hours as needed for itching. 90 capsule 3  . furosemide (LASIX) 20 MG tablet Take 3 tablets (60 mg total) by mouth daily. 90 tablet 11  . gabapentin (NEURONTIN) 100 MG capsule Take 1 capsule (100 mg total) by mouth 3 (three) times daily. (Patient taking differently: Take 100 mg by mouth 2 (two) times daily. ) 270 capsule 1  . HUMALOG KWIKPEN 100 UNIT/ML KiwkPen IF SUGAR:140-200 USE 8 UNITS,201-230 10 UNITS,231-260 12 UNITS,261-300 14  UNITS,>301 16 UNITS (Patient taking differently: Inject into the skin two to three times a day before a meal as needed per sliding scale: BGL 140-200 = 8 units; 201-230 = 10 units; 231-260 = 12 units; 261-300 = 14 units; >301 = 160 units) 9 pen 11  . insulin degludec (TRESIBA FLEXTOUCH) 100 UNIT/ML SOPN FlexTouch Pen Inject 0.16 mLs (16 Units total) into the skin daily. 5 pen 1  . Insulin Pen Needle 31G X 5 MM MISC Use to administer insulin four times a day Dx E11.9 130 each 5  . levothyroxine (SYNTHROID, LEVOTHROID) 25 MCG tablet TAKE 2 TABLETS (50 MCG TOTAL) BY MOUTH DAILY. 60 tablet 11  . metoprolol tartrate (LOPRESSOR) 25 MG tablet TAKE 1 TABLET BY MOUTH TWICE A DAY *NEEDS APPT FOR REFILLS (Patient taking differently: Take 25 mg by mouth two times a day) 180 tablet 0  . Multiple Vitamins-Minerals (CENTRUM SILVER 50+WOMEN) TABS Take 1 tablet by mouth daily with breakfast.    . mycophenolate (MYFORTIC) 180 MG EC tablet Take 360 mg by mouth 2 (two) times daily.     [name] DELICA LANCETS 64Q MISC Use to check blood sugars twice a day DX E11.9 100 each 5  . ONETOUCH VERIO test strip USE AS INSTRUCTED 4 TIMES A DAY (BEFORE MEALS) & AT BEDTIME (ICD 10-E11.9) 150 each 3  . oxyCODONE-acetaminophen (ROXICET) 5-325 MG tablet Take 0.5-1 tablets by mouth every 6 (six) hours as needed for severe pain. 100 tablet 0  . potassium chloride SA (K-DUR,KLOR-CON) 20 MEQ tablet Take 2  tablets (40 mEq total) by mouth daily. 30 tablet 0  . tacrolimus (PROGRAF) 1 MG capsule Take 4 mg by mouth 2 (two) times daily.     Marland Kitchen warfarin (COUMADIN) 5 MG tablet Take 1-1.5 tablets by mouth daily as directed by coumadin clinic (Patient taking differently: Take 5-7.5 mg by mouth See admin instructions. 5 mg at bedtime on Sun/Tues/Wed/Thurs/Sat and 7.5 mg on Mon/Fri) 120 tablet 0   No facility-administered medications prior to visit.     ROS Review of Systems  Constitutional: Negative for chills and fever.  Respiratory: Positive  for shortness of breath.   Cardiovascular: Negative for chest pain, palpitations and PND.  Musculoskeletal: Negative for falls.  Skin: Negative.   Neurological: Negative for dizziness, sensory change, focal weakness, loss of consciousness and weakness.  Psychiatric/Behavioral: Negative for depression. The patient does not have insomnia.      Objective:  BP (!) 150/66   Pulse 75   Temp 98.6 F (37 C)   Ht 5\' 1"  (1.549 m)   Wt 167 lb (75.8 kg)   SpO2 95%   BMI 31.55 kg/m   BP Readings from Last 3 Encounters:  01/07/17 (!) 150/66  01/01/17 (!) 131/55  11/22/16 (!) 150/90    Wt Readings from Last 3 Encounters:  01/07/17 167 lb (75.8 kg)  01/01/17 163 lb (73.9 kg)  11/22/16 172 lb 6.4 oz (78.2 kg)    Physical Exam  Constitutional: She is oriented to person, place, and time. No distress.  Neck: Normal range of motion. Neck supple.  Cardiovascular: Normal rate.   Murmur heard. Pulmonary/Chest: Effort normal and breath sounds normal.  Musculoskeletal: She exhibits edema.  Bilateral LE edema. Left arm fistula with bruit and thrill, no edema.  Neurological: She is alert and oriented to person, place, and time.  Skin: Skin is warm and dry.  Vitals reviewed.   Lab Results  Component Value Date   WBC 8.4 01/07/2017   HGB 12.4 01/07/2017   HCT 39.9 01/07/2017   PLT 249.0 01/07/2017   GLUCOSE 128 (H) 01/07/2017   CHOL 174 11/18/2016   TRIG 131.0 11/18/2016   HDL 75.10 11/18/2016   LDLDIRECT 89.3 06/13/2006   LDLCALC 72 11/18/2016   ALT 17 01/07/2017   AST 23 01/07/2017   NA 141 01/07/2017   K 4.7 01/07/2017   CL 104 01/07/2017   CREATININE 1.54 (H) 01/07/2017   BUN 34 (H) 01/07/2017   CO2 32 01/07/2017   TSH 2.08 01/07/2017   INR 3.0 01/04/2017   HGBA1C 8.4 (H) 11/18/2016   MICROALBUR 317.3 (H) 07/02/2016    Dg Chest 2 View  Result Date: 12/27/2016 CLINICAL DATA:  Generalized chest pain with left breast and left arm swelling. Two weeks of cough. Chest pain  has a pleuritic component. History of previous CVA and MI, end-stage renal disease. EXAM: CHEST  2 VIEW COMPARISON:  Chest x-ray of May 14, 2013 and CT scan chest of November 10, 2015. FINDINGS: The lungs are reasonably well inflated. The pulmonary interstitial markings are increased. The pulmonary vascularity is engorged. The cardiac silhouette is enlarged. The patient has undergone previous median sternotomy, aortic valve replacement, Maze procedure, and left atrial appendage clip placement. The sternal wires appear intact. The observed bony thorax is unremarkable. IMPRESSION: CHF with pulmonary interstitial edema. No definite pneumonia nor pleural effusion. Electronically Signed   By: David  Martinique M.D.   On: 12/27/2016 16:25    Assessment & Plan:   Kecia was seen today for hospitalization  follow-up.  Diagnoses and all orders for this visit:  Chronic diastolic congestive heart failure ([place]) -     Comprehensive metabolic panel; Future  Acute on chronic combined systolic and diastolic congestive heart failure ([place]) -     DG Chest 2 View; Future -     Comprehensive metabolic panel; Future  Multinodular goiter -     CBC; Future -     TSH; Future -     T4, free; Future   I am having Ms. Camargo maintain her mycophenolate, tacrolimus, dapsone, diphenhydrAMINE, magic mouthwash, levothyroxine, insulin degludec, HUMALOG KWIKPEN, gabapentin, ONETOUCH DELICA LANCETS 83A, Insulin Pen Needle, ONETOUCH VERIO, oxyCODONE-acetaminophen, warfarin, metoprolol tartrate, CENTRUM SILVER 50+WOMEN, amLODipine, potassium chloride SA, and furosemide.  No orders of the defined types were placed in this encounter.   Follow-up: Return in about 3 months (around 04/09/2017) for hypothyroidism.  Wilfred Lacy, NP

## 2017-01-07 NOTE — Patient Instructions (Addendum)
Go to basement for blood draw Weigh daily. Contact us or cardiology if increased SOB, edema or weight gain >5Lbs in 3days.  Heart Failure Heart failure means your heart has trouble pumping blood. This makes it hard for your body to work well. Heart failure is usually a long-term (chronic) condition. You must take good care of yourself and follow your doctor's treatment plan. Follow these instructions at home:  Take your heart medicine as told by your doctor. ? Do not stop taking medicine unless your doctor tells you to. ? Do not skip any dose of medicine. ? Refill your medicines before they run out. ? Take other medicines only as told by your doctor or pharmacist.  Stay active if told by your doctor. The elderly and people with severe heart failure should talk with a doctor about physical activity.  Eat heart-healthy foods. Choose foods that are without trans fat and are low in saturated fat, cholesterol, and salt (sodium). This includes fresh or frozen fruits and vegetables, fish, lean meats, fat-free or low-fat dairy foods, whole grains, and high-fiber foods. Lentils and dried peas and beans (legumes) are also good choices.  Limit salt if told by your doctor.  Cook in a healthy way. Roast, grill, broil, bake, poach, steam, or stir-fry foods.  Limit fluids as told by your doctor.  Weigh yourself every morning. Do this after you pee (urinate) and before you eat breakfast. Write down your weight to give to your doctor.  Take your blood pressure and write it down if your doctor tells you to.  Ask your doctor how to check your pulse. Check your pulse as told.  Lose weight if told by your doctor.  Stop smoking or chewing tobacco. Do not use gum or patches that help you quit without your doctor's approval.  Schedule and go to doctor visits as told.  Nonpregnant women should have no more than 1 drink a day. Men should have no more than 2 drinks a day. Talk to your doctor about drinking  alcohol.  Stop illegal drug use.  Stay current with shots (immunizations).  Manage your health conditions as told by your doctor.  Learn to manage your stress.  Rest when you are tired.  If it is really hot outside: ? Avoid intense activities. ? Use air conditioning or fans, or get in a cooler place. ? Avoid caffeine and alcohol. ? Wear loose-fitting, lightweight, and light-colored clothing.  If it is really cold outside: ? Avoid intense activities. ? Layer your clothing. ? Wear mittens or gloves, a hat, and a scarf when going outside. ? Avoid alcohol.  Learn about heart failure and get support as needed.  Get help to maintain or improve your quality of life and your ability to care for yourself as needed. Contact a doctor if:  You gain weight quickly.  You are more short of breath than usual.  You cannot do your normal activities.  You tire easily.  You cough more than normal, especially with activity.  You have any or more puffiness (swelling) in areas such as your hands, feet, ankles, or belly (abdomen).  You cannot sleep because it is hard to breathe.  You feel like your heart is beating fast (palpitations).  You get dizzy or light-headed when you stand up. Get help right away if:  You have trouble breathing.  There is a change in mental status, such as becoming less alert or not being able to focus.  You have chest pain or  discomfort.  You faint. This information is not intended to replace advice given to you by your health care provider. Make sure you discuss any questions you have with your health care provider. Document Released: 04/13/2008 Document Revised: 12/11/2015 Document Reviewed: 08/21/2012 Elsevier Interactive Patient Education  2017 [employer].

## 2017-01-10 ENCOUNTER — Other Ambulatory Visit: Payer: Self-pay

## 2017-01-10 NOTE — Patient Outreach (Signed)
[facility]) Care Management  01/10/2017  Ailis Rigaud Deininger 1946-04-18 [id number]  EMMI: Heart Failure Referral date: 01/10/17 Referral source: EMMI heart failure red alert Referral reason: New / worsening problems: YES Day # 5 For heart failure patient water pills  Telephone call to patient. Unable to reach patient or leave voice mail message. Phone continued to ring.   PLAN: RNCM will attempt 2nd telephone call to patient within 3 business days.   Quinn Plowman RN,BSN,CCM [facility] Telephonic  [phone]

## 2017-01-12 ENCOUNTER — Other Ambulatory Visit: Payer: Self-pay

## 2017-01-12 NOTE — Patient Outreach (Signed)
[facility]) Care Management  01/12/2017  Eriona Kinchen Sundt Oct 16, 1945 [id number]  EMMI: Heart failure Referral date: 01/10/17 Referral source: Emmi heart failure red alert Referral reason: New worsening problems: YES,  Weight: 167 Day # 7  Telephone call to patient regarding EMMI heart failure red alert. HIPAA verified with patient.  Discussed EMMI heart failure program with patient. Offered patient services. Patient verbally agreed to follow up with health coach. Patient verbally agreed to receive heart failure education and management.  RNCM discussed Advance Directive With patient. Patient request Advance Directive packet be mailed to her.  Patient verified she was recently discharged from the hospital due to heart failure. Patient states she has had a follow up with her primary care provider since she was discharged. Patient states she has an appointment scheduled with her cardiologist for September 2018. Patient states she sees her cardiologist yearly.  Patient reports her weight today is 165 lbs. Patient denies having any shortness of breath or swelling. Patient states she is doing fine.   ASSESSMENT:  PCP: [name], MD  Admit date: 12/27/2016 Discharge date: 01/01/2017  Discharge Diagnoses:    Principal Problem:   Acute on chronic diastolic CHF (congestive heart failure) ([place]) 71 year old female with hypertension, hyperlipidemia, history of kidney transplant in 2012, chronic kidney disease stage III, OSA not on CPAP, A. fib on Coumadin, history of maze procedure, aortic stenosis with aortic valve replacement with bioprosthetic valve, CAD, history of stroke, diastolic CHF,   PLAN: RNCM will refer patient to Health coach.   Quinn Plowman RN,BSN,CCM [facility] Telephonic  [phone]

## 2017-01-12 NOTE — Addendum Note (Signed)
Addended by: Quinn Plowman E on: 01/12/2017 12:47 PM   Modules accepted: Orders

## 2017-01-17 ENCOUNTER — Ambulatory Visit (INDEPENDENT_AMBULATORY_CARE_PROVIDER_SITE_OTHER): Payer: Medicare Other | Admitting: Neurology

## 2017-01-17 ENCOUNTER — Other Ambulatory Visit: Payer: Self-pay

## 2017-01-17 ENCOUNTER — Encounter: Payer: Self-pay | Admitting: Neurology

## 2017-01-17 VITALS — BP 146/64 | HR 57 | Ht 61.0 in | Wt 163.0 lb

## 2017-01-17 DIAGNOSIS — R29898 Other symptoms and signs involving the musculoskeletal system: Secondary | ICD-10-CM

## 2017-01-17 DIAGNOSIS — Z794 Long term (current) use of insulin: Secondary | ICD-10-CM

## 2017-01-17 DIAGNOSIS — N184 Chronic kidney disease, stage 4 (severe): Secondary | ICD-10-CM | POA: Diagnosis not present

## 2017-01-17 DIAGNOSIS — E0840 Diabetes mellitus due to underlying condition with diabetic neuropathy, unspecified: Secondary | ICD-10-CM | POA: Diagnosis not present

## 2017-01-17 NOTE — Progress Notes (Signed)
[facility] Clinic Note - Initial Visit   Date: 01/17/17  [name] MRN: [id number] DOB: 1946-04-22   Dear Dr. Alain [place]:  Thank you for your kind referral of [name] for consultation of right arm weakness and atrophy. Although her history is well known to you, please allow [place] to reiterate it for the purpose of our medical record. The patient was accompanied to the clinic by self.    History of Present Illness: [name] is a 71 y.o. right-handed African American female with insulin-dependent diabetes mellitus and hypertension s/p kidney transplant (on Cellcept and Prograf, 2012), atrial fibrillation s/p MAZE, s/p ARV, MI s/p CABG, stroke with residual dysarthria and right leg weakness (2009)hypertension, hyperlipidemia, GERD, hypothyroidism, presenting for ev2aluation of right arm weakness and muscle loss.    She started dialysis in 2009 and recalls having frequent blood pressure measures and states that it was always very uncomfortable and painful in her arm and shoulder. Since this time, she noticed gradual progressive difficulty raising her right arm and asymetrical smaller forearm. She had difficulty raising her right arm to brush her hair and reaching across her opposite shoulder and her back.  She complains of sharp pain in the shoulder.  She also has neck pain and stiffness.  She has some difficulty opening jars with her right hand and holding heavy objects such as holding a milk carton.    She has some mild numbness in the right hand.   She does not have significant neck pain and denies having AV fistula in the right arm.  She takes dapsone for PCP prophylaxis.  Out-side paper records, electronic medical record, and images have been reviewed where available and summarized as:  Lab Results  Component Value Date   TSH 2.08 01/07/2017   Lab Results  Component Value Date   HGBA1C 8.4 (H) 11/18/2016   Lab Results  Component Value Date   [id number] 382 01/09/2014     Past Medical History:  Diagnosis Date  . Anemia, iron deficiency    of chronic disease  . Aortic stenosis    a. Severe AS by echo 11/2012.  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 Asystole ([place])    a. During ENT surgery 2005: developed marked asystole requiring CPR, felt due to vagal reaction (cath nonobst dz).  . Carotid artery disease ([place])    a. Carotid Dopplers performed in August 2013 showed 40-59% left stenosis and 0-39% right; f/u recommended in 2 years.   . Cerebrovascular accident [facility]) 2009   a. LMCA infarct felt embolic 5053, maintained on chronic coumadin.; denies residual on 04/05/2013  . CHF (congestive heart failure) ([place])   . Cholelithiasis   . Chronic cough onset 03/2010   Dr Melvyn Novas  . Chronic Persistent Atrial Fibrillation 12/31/2008   Qualifier: Diagnosis of  By: [name]    . Coronary artery disease 05/2002   a. Ant MI 2003 s/p PTCA/stent to RCA.   . Diverticulosis of colon   . Esophagitis, reflux   . ESRD (end stage renal disease) ([place])    a. Mass on L kidney per pt s/p nephrectomy - pt states not cancer - [facility] notes indicate ESRD due to HTN/DM - was previously on HD. b. Kidney transplant 02/2011.  Marland Kitchen GERD (gastroesophageal reflux disease)   . Gout   . Helicobacter pylori (H. pylori) infection    hx of  . Hemorrhoids   . Hx of colonic polyps  adenomatous  . Hyperlipidemia   . Hypertension   . Lung nodule seen on imaging study 04/07/2013   1.0 cm ground glass opacity RUL  . Myocardial infarction ([place]) 2003  . Pericardial effusion    a. Small by echo 11/2011.  Marland Kitchen Pruritic dermatitis    treated with steriods/UV light  . S/P aortic valve replacement with bioprosthetic valve and maze procedure 04/12/2013   41mm [facility] Ease bovine pericardial tissue valve   . S/P Maze operation for atrial fibrillation 04/12/2013   Complete bilateral atrial lesion set using bipolar radiofrequency and cryothermy ablation  with clipping of LA appendage  . Sleep apnea    Pt says testing was positive, intolerant of CPAP.  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  . Type II diabetes mellitus ([place])   . Unspecified hearing loss     Past Surgical History:  Procedure Laterality Date  . AORTIC VALVE REPLACEMENT N/A 04/12/2013   Procedure: AORTIC VALVE REPLACEMENT (AVR);  Surgeon: Rexene Alberts, MD;  Location: [place];  Service: Open Heart Surgery;  Laterality: N/A;  . ARTERIOVENOUS GRAFT PLACEMENT Left   . ARTERIOVENOUS GRAFT PLACEMENT Left    "I've had 2 on my left; had one removed" (04/05/2013)   . ARTERY EXPLORATION Right 04/11/2013   Procedure: ARTERY EXPLORATION;  Surgeon: Rexene Alberts, MD;  Location: [place];  Service: Open Heart Surgery;  Laterality: Right;  Right carotid artery exploration  . AV FISTULA PLACEMENT Right   . AV FISTULA REPAIR Right    "took it out" ((/18/2014)  . CARDIOVERSION  05/29/2012   Procedure: CARDIOVERSION;  Surgeon: Lelon Perla, MD;  [facility] ENDOSCOPY;  Service: Cardiovascular;  Laterality: N/A;  . CHOLECYSTECTOMY  2009   with hernia removal  . CORONARY ANGIOPLASTY WITH STENT PLACEMENT Right    coronary artery  . INSERTION OF DIALYSIS CATHETER Bilateral    "over the years; took them both out" (04/05/2013)  . INTRAOPERATIVE TRANSESOPHAGEAL ECHOCARDIOGRAM N/A 04/11/2013   Procedure: INTRAOPERATIVE TRANSESOPHAGEAL ECHOCARDIOGRAM;  Surgeon: Rexene Alberts, MD;  Location: [place];  Service: Open Heart Surgery;  Laterality: N/A;  . INTRAOPERATIVE TRANSESOPHAGEAL ECHOCARDIOGRAM N/A 04/12/2013   Procedure: INTRAOPERATIVE TRANSESOPHAGEAL ECHOCARDIOGRAM;  Surgeon: Rexene Alberts, MD;  Location: [place];  Service: Open Heart Surgery;  Laterality: N/A;  . KIDNEY TRANSPLANT  03/16/11  . LEFT AND RIGHT HEART CATHETERIZATION WITH  CORONARY ANGIOGRAM N/A 04/06/2013   Procedure: LEFT AND RIGHT HEART CATHETERIZATION WITH CORONARY ANGIOGRAM;  Surgeon: Blane Ohara, MD;  Location: [facility] CATH LAB;  Service: Cardiovascular;  Laterality: N/A;  . MAZE N/A 04/12/2013   Procedure: MAZE;  Surgeon: Rexene Alberts, MD;  Location: [place];  Service: Open Heart Surgery;  Laterality: N/A;  . NASAL RECONSTRUCTION WITH SEPTAL REPAIR     "took it out" (04/05/2013)  . NEPHRECTOMY Left 2010   no CA on bx  . TONSILLECTOMY    . TOTAL ABDOMINAL HYSTERECTOMY    . TUBAL LIGATION       Medications:  Outpatient Encounter Prescriptions as of 01/17/2017  Medication Sig Note  . Alum & Mag Hydroxide-Simeth (MAGIC MOUTHWASH) SOLN Take 5 mLs by mouth 4 (four) times daily. Swish, hold and swallow   . amLODipine (NORVASC) 5 MG tablet Take 1 tablet (5 mg total) by mouth daily.   . dapsone 25 MG tablet Take 25  mg by mouth daily.    . diphenhydrAMINE (BENADRYL) 50 MG capsule Take 1 capsule (50 mg total) by mouth every 6 (six) hours as needed for itching.   . furosemide (LASIX) 20 MG tablet Take 3 tablets (60 mg total) by mouth daily.   Marland Kitchen gabapentin (NEURONTIN) 100 MG capsule Take 1 capsule (100 mg total) by mouth 3 (three) times daily. (Patient taking differently: Take 100 mg by mouth 2 (two) times daily. )   . HUMALOG KWIKPEN 100 UNIT/ML KiwkPen IF SUGAR:140-200 USE 8 UNITS,201-230 10 UNITS,231-260 12 UNITS,261-300 14 UNITS,>301 16 UNITS (Patient taking differently: Inject into the skin two to three times a day before a meal as needed per sliding scale: BGL 140-200 = 8 units; 201-230 = 10 units; 231-260 = 12 units; 261-300 = 14 units; >301 = 160 units) 12/27/2016: Dosage verified by the patient  . insulin degludec (TRESIBA FLEXTOUCH) 100 UNIT/ML SOPN FlexTouch Pen Inject 0.16 mLs (16 Units total) into the skin daily.   . Insulin Pen Needle 31G X 5 MM MISC Use to administer insulin four times a day Dx E11.9   . levothyroxine (SYNTHROID, LEVOTHROID) 25 MCG tablet  TAKE 2 TABLETS (50 MCG TOTAL) BY MOUTH DAILY.   . metoprolol tartrate (LOPRESSOR) 25 MG tablet TAKE 1 TABLET BY MOUTH TWICE A DAY *NEEDS APPT FOR REFILLS (Patient taking differently: Take 25 mg by mouth two times a day)   . Multiple Vitamins-Minerals (CENTRUM SILVER 50+WOMEN) TABS Take 1 tablet by mouth daily with breakfast.   . mycophenolate (MYFORTIC) 180 MG EC tablet Take 360 mg by mouth 2 (two) times daily.    [name] DELICA LANCETS 67E MISC Use to check blood sugars twice a day DX E11.9   . ONETOUCH VERIO test strip USE AS INSTRUCTED 4 TIMES A DAY (BEFORE MEALS) & AT BEDTIME (ICD 10-E11.9)   . oxyCODONE-acetaminophen (ROXICET) 5-325 MG tablet Take 0.5-1 tablets by mouth every 6 (six) hours as needed for severe pain. 12/27/2016: LF #100 on 11/18/16, per [facility]  . potassium chloride SA (K-DUR,KLOR-CON) 20 MEQ tablet Take 2 tablets (40 mEq total) by mouth daily.   . tacrolimus (PROGRAF) 1 MG capsule Take 4 mg by mouth 2 (two) times daily.    Marland Kitchen warfarin (COUMADIN) 5 MG tablet Take 1-1.5 tablets by mouth daily as directed by coumadin clinic (Patient taking differently: Take 5-7.5 mg by mouth See admin instructions. 5 mg at bedtime on Sun/Tues/Wed/Thurs/Sat and 7.5 mg on Mon/Fri) 12/27/2016: Regimen verified by the patient   No facility-administered encounter medications on file as of 01/17/2017.      Allergies:  Allergies  Allergen Reactions  . Ibuprofen Nausea And Vomiting  . Sulfamethoxazole-Trimethoprim Itching, Swelling and Rash    Swelling of the face  . Sulfonamide Derivatives Itching, Swelling and Rash    Swelling of the face  . Tape Rash  . Tramadol Nausea And Vomiting  . Hydrocil [Psyllium] Nausea And Vomiting  . Bactrim Itching, Swelling and Rash  . Red Dye Itching and Rash    Family History: Family History  Problem Relation Age of Onset  . Stroke Father   . Hypertension Mother   . Diabetes Other   . Diabetes Maternal Grandmother   . Diabetes Son     Social  History: Social History  Substance Use Topics  . Smoking status: Former Smoker    Packs/day: 1.00    Years: 30.00    Types: Cigarettes    Quit date: 07/19/2001  . Smokeless tobacco: Never  Used  . Alcohol use No   Social History   Social History Narrative   Patient signed a Environmental consultant to allow her spouse [name] and family and five children to have access to her medical records/information.     Patient lives with husband in a 2 story home.  Has 5 children. Retired from Personal assistant.  Education: 3 years of college.     Review of Systems:  CONSTITUTIONAL: No fevers, chills, night sweats, or weight loss.   EYES: No visual changes or eye pain ENT: No hearing changes.  No history of nose bleeds.   RESPIRATORY: No cough, wheezing and shortness of breath.   CARDIOVASCULAR: Negative for chest pain, and palpitations.   GI: Negative for abdominal discomfort, blood in stools or black stools.  No recent change in bowel habits.   GU:  No history of incontinence.   MUSCLOSKELETAL: +history of joint pain or swelling.  No myalgias.   SKIN: Negative for lesions, rash, and itching.   HEMATOLOGY/ONCOLOGY: Negative for prolonged bleeding, bruising easily, and swollen nodes.  No history of cancer.   ENDOCRINE: Negative for cold or heat intolerance, polydipsia or goiter.   PSYCH:  No depression or anxiety symptoms.   NEURO: As Above.   Vital Signs:  BP (!) 146/64   Pulse (!) 57   Ht 5\' 1"  (1.549 m)   Wt 163 lb (73.9 kg)   SpO2 96%   BMI 30.80 kg/m    General Medical Exam:   General:  Well appearing, comfortable.   Eyes/ENT: see cranial nerve examination.   Neck: No masses appreciated.  Full range of motion without tenderness.  No carotid bruits. Respiratory:  Clear to auscultation, good air entry bilaterally.   Cardiac:  Irregularly irregular.   Extremities: Reduced R shoulder ROM with arm abduction, extension, and internal rotation.   Skin:  No rashes or  lesions.  Neurological Exam: MENTAL STATUS including orientation to time, place, person, recent and remote memory, attention span and concentration, language, and fund of knowledge is normal.  Speech is not dysarthric.  CRANIAL NERVES: II:  No visual field defects.  Unremarkable fundi.   III-IV-VI: Pupils equal round and reactive to light.  Normal conjugate, extra-ocular eye movements in all directions of gaze.  No nystagmus.  No ptosis.   V:  Normal facial sensation.   VII:  Normal facial symmetry and movements.    VIII:  Normal hearing and vestibular function.   IX-X:  Normal palatal movement.   XI:  Normal shoulder shrug and head rotation.   XII:  Normal tongue strength and range of motion, no deviation or fasciculation.  MOTOR:  There is mild R forearm loss of muscle bulk and soft tissue; the L upper arm and forearm shows increased soft tissue as compared to the right.  No fasciculations or abnormal movements.  No pronator drift.  Tone is normal.    Right Upper Extremity:    Left Upper Extremity:    Deltoid *reduced ROM 5-/5   Deltoid  5/5   Biceps  5-/5   Biceps  5/5   Triceps  5-/5   Triceps  5/5   Pronator 5/5  Pronator 5/5  Supination 5/5  Supination 5/5  Wrist extensors  5/5   Wrist extensors  5/5   Wrist flexors  5/5   Wrist flexors  5/5   Finger extensors  5/5   Finger extensors  5/5   Finger flexors  5/5   Finger flexors  5/5   Dorsal interossei  5-/5   Dorsal interossei  5-/5   Abductor pollicis  4/5   Abductor pollicis  5/5   Tone (Ashworth scale)  0  Tone (Ashworth scale)  0   Right Lower Extremity:    Left Lower Extremity:    Hip flexors  5/5   Hip flexors  5/5   Hip extensors  5/5   Hip extensors  5/5   Knee flexors  5/5   Knee flexors  5/5   Knee extensors  5/5   Knee extensors  5/5   Dorsiflexors  5/5   Dorsiflexors  5/5   Plantarflexors  5/5   Plantarflexors  5/5   Toe extensors  5/5   Toe extensors  5/5   Toe flexors  5/5   Toe flexors  5/5   Tone  (Ashworth scale)  0  Tone (Ashworth scale)  0   MSRs:  Right                                                                 Left brachioradialis 2+  brachioradialis 2+  biceps 2+  biceps 2+  triceps 2+  triceps 2+  patellar 2+  patellar 2+  ankle jerk 0  ankle jerk 0  Hoffman no  Hoffman no  plantar response down  plantar response down   SENSORY: Vibration is reduced in the toes bilaterally.  Temperature and pin prick intact throughout.  COORDINATION/GAIT: Normal finger-to- nose-finger and heel-to-shin.  Intact rapid alternating movements bilaterally.   Gait slow, slightly wide-based based and stable, unassisted  IMPRESSION: Mrs. Esquibel is a 71 year-old female with history of renal transplant on immunosuppression, insulin-dependent diabetes c/b neuropathy, hyperlipidemia, atrial fibrillation s/p MAZE, s/p AVR on coumadin referred for evaluation of right arm weakness and wasting.  Her exam shows asymmetrical limb circumference and size on the right upper and forearm due to the presence of reduced soft tissue as compared to the left.  Surprisingly, her motor strength of the upper arm, forearm, and wrist extensors/flexors is only mildly reduced, however right shoulder ROM is marked limited.  Reflexes and sensation is intact. She will have NCS/EMG of the right arm to evaluate for neuopathic process such as brachial amyotrophy or radiculopathy.  Alternatively, she may have a frozen shoulder on the right with overlapping neuropathy/radiculopathy and loss of soft tissue, as I do not appreciate marked muscle atrophy.  Dapsone-induced neuropathy seems unlikely given the asymmetrical presentation.  Further recommendations will be based on her testing results  The duration of this appointment visit was 45 minutes of face-to-face time with the patient.  Greater than 50% of this time was spent in counseling, explanation of diagnosis, planning of further management, and coordination of care.   Thank you  for allowing me to participate in patient's care.  If I can answer any additional questions, I would be pleased to do so.    Sincerely,    Donika K. Posey Pronto, DO

## 2017-01-17 NOTE — Patient Instructions (Addendum)
Nerve testing of the right arm on July 3rd 2:15pm.  Please arrive 15 minutes prior to appointment.   We will call you with the results

## 2017-01-18 ENCOUNTER — Ambulatory Visit (INDEPENDENT_AMBULATORY_CARE_PROVIDER_SITE_OTHER): Payer: Medicare Other | Admitting: Neurology

## 2017-01-18 ENCOUNTER — Telehealth: Payer: Self-pay | Admitting: Neurology

## 2017-01-18 ENCOUNTER — Other Ambulatory Visit: Payer: Self-pay | Admitting: Internal Medicine

## 2017-01-18 DIAGNOSIS — G5601 Carpal tunnel syndrome, right upper limb: Secondary | ICD-10-CM

## 2017-01-18 DIAGNOSIS — R29898 Other symptoms and signs involving the musculoskeletal system: Secondary | ICD-10-CM

## 2017-01-18 NOTE — Telephone Encounter (Signed)
EMG results discussed with patient which shows only very mild carpal tunnel syndrome, otherwise normal and symmetric sensory and motor responses. Specifically, there is no evidence of diffuse myopathy or radiculopathy.   Unfortunately this does not explain her asymmetrical limb size the right. Recommend MRI of the cervical spine wo contrast to evaluate for spinal cord impingement.  [name] Walen K. Posey Pronto, DO

## 2017-01-18 NOTE — Procedures (Signed)
[facility]  [address]  [place], [place] [address] Tel: [phone] Fax:  [phone] Test Date:  01/18/2017  Patient: [name] DOB: [birth year] Physician: Narda Amber, DO  Sex: Female Height: 5\' 1"  Ref Phys: Narda Amber, DO  ID#: [id number] Temp: 33.6C Technician:    Patient Complaints: This is a 71 year-old female referred for evaluation of right arm weakness and muscle atrophy.  NCV & EMG Findings: Extensive electrodiagnostic testing of the right upper extremity and additional studies of the left shows:  1. Bilateral median and ulnar and right radial sensory responses within normal limits.  Right mixed palmar sensory responses show prolonged latency. 2. Bilateral median and ulnar motor responses are within normal limits. 3. Sparse chronic motor axon loss changes were isolated to the right abductor pollicis brevis muscle, without accompanied active denervation.  Impression: 1. Right median neuropathy at or distal to the wrist, consistent with a clinical diagnosis of carpal tunnel syndrome. Overall, these findings are very mild in degree electrically. 2. There is no evidence of a diffuse myopathy or cervical radiculopathy affecting the right upper extremity.   ___________________________ Narda Amber, DO    Nerve Conduction Studies Anti Sensory Summary Table   Site NR Peak (ms) Norm Peak (ms) P-T Amp (V) Norm P-T Amp  Left Median Anti Sensory (2nd Digit)  34.2C  Wrist    3.5 <3.8 10.2 >10  Right Median Anti Sensory (2nd Digit)  34.2C  Wrist    3.4 <3.8 15.7 >10  Right Radial Anti Sensory (Base 1st Digit)  34.2C  Wrist    2.2 <2.8 22.4 >10  Left Ulnar Anti Sensory (5th Digit)  34.2C  Wrist    3.2 <3.2 7.8 >5  Right Ulnar Anti Sensory (5th Digit)  34.2C  Wrist    2.7 <3.2 11.7 >5   Motor Summary Table   Site NR Onset (ms) Norm Onset (ms) O-P Amp (mV) Norm O-P Amp Site1 Site2 Delta-0 (ms) Dist (cm) Vel (m/s) Norm Vel (m/s)  Left  Median Motor (Abd Poll Brev)  34.2C  Wrist    3.4 <4.0 9.6 >5 Elbow Wrist 5.2 26.0 50 >50  Elbow    8.6  8.9         Right Median Motor (Abd Poll Brev)  34.2C  Wrist    3.4 <4.0 7.3 >5 Elbow Wrist 5.8 29.0 50 >50  Elbow    9.2  6.8         Left Ulnar Motor (Abd Dig Minimi)  34.2C  Wrist    3.1 <3.1 7.2 >7 B Elbow Wrist 4.4 22.0 50 >50  B Elbow    7.5  6.8  A Elbow B Elbow 1.6 10.0 63 >50  A Elbow    9.1  6.6         Right Ulnar Motor (Abd Dig Minimi)  34.2C  Wrist    2.5 <3.1 7.5 >7 B Elbow Wrist 4.5 23.0 51 >50  B Elbow    7.0  7.3  A Elbow B Elbow 1.9 10.0 53 >50  A Elbow    8.9  6.9          Comparison Summary Table   Site NR Peak (ms) Norm Peak (ms) P-T Amp (V) Site1 Site2 Delta-P (ms) Norm Delta (ms)  Right Median/Ulnar Palm Comparison (Wrist - 8cm)  34.2C  Median Palm    2.4 <2.2 20.7 Median Palm Ulnar Palm 0.7   Ulnar Palm    1.7 <2.2  6.1       EMG   Side Muscle Ins Act Fibs Psw Fasc Number Recrt Dur Dur. Amp Amp. Poly Poly. Comment  Right 1stDorInt Nml Nml Nml Nml Nml Nml Nml Nml Nml Nml Nml Nml N/A  Right Abd Poll Brev Nml Nml Nml Nml 1- Rapid Few 1+ Few 1+ Nml Nml N/A  Right Ext Indicis Nml Nml Nml Nml Nml Nml Nml Nml Nml Nml Nml Nml N/A  Right PronatorTeres Nml Nml Nml Nml Nml Nml Nml Nml Nml Nml Nml Nml N/A  Right Biceps Nml Nml Nml Nml Nml Nml Nml Nml Nml Nml Nml Nml N/A  Right Triceps Nml Nml Nml Nml Nml Nml Nml Nml Nml Nml Nml Nml N/A  Right Deltoid Nml Nml Nml Nml Nml Nml Nml Nml Nml Nml Nml Nml N/A      Waveforms:

## 2017-01-20 ENCOUNTER — Other Ambulatory Visit: Payer: Self-pay

## 2017-01-20 ENCOUNTER — Other Ambulatory Visit: Payer: Self-pay | Admitting: *Deleted

## 2017-01-20 DIAGNOSIS — M6258 Muscle wasting and atrophy, not elsewhere classified, other site: Secondary | ICD-10-CM

## 2017-01-20 NOTE — Patient Outreach (Signed)
[facility]) Care Management  01/20/2017  Imanii Gosdin Con[place] 1945/10/24 [id number]   Telephone call to patient for initial assessment.  HIPAA identifiers confirmed.   Patient stated she was on her way to the post office, and requested I call her back tomorrow.  RN will schedule for 01-21-17.  Candie Mile, RN, MSN [place] [phone] Fax [phone]

## 2017-01-20 NOTE — Patient Outreach (Addendum)
[place] [facility]) Care Management  01/20/2017  Santana Gosdin [place] 05-07-1946 [id number]    EMMI: Heart failure Referral date: 01/17/17 Referral source: Emmi heart failure red alert Referral reason: Do others at home smoke Day # 10  Late entry for 01/17/17 Telephone call to patient regarding EMMI heart failure red alert.  HIPAA verified with patient. Patient states no one smokes in her home. Patient denies being on oxygen.  Patient reports she has seen her primary MD and has a follow up appointment scheduled with her cardiologist in September 2018. Patient states she has had follow up at the coumadin clinic and has seen the kidney doctor for lab work. Patient reports she saw the neurologist today and has a nerve conduction study scheduled for tomorrow 01/18/17 due to right shoulder pain.  Patient denies any concerns at this time. Patient verbally agreed to continued EMMI calls. Patient aware that [facility] health coach will contact her for follow up.   PLAN:  No further follow up needed by this RNCM at this time.  RNCM send patients assigned health coach notification of EMMI red alert for today.  Advance directive mailed to patient as requested.   Quinn Plowman RN,BSN,CCM [facility] Telephonic  [phone]

## 2017-01-20 NOTE — Telephone Encounter (Signed)
Routing to dr plotnikov, are you ok with refilling, please advise, thanks

## 2017-01-20 NOTE — Telephone Encounter (Signed)
Patient given results and MRI referral sent.

## 2017-01-21 ENCOUNTER — Other Ambulatory Visit: Payer: Self-pay

## 2017-01-21 VITALS — Ht 61.0 in | Wt 163.0 lb

## 2017-01-21 DIAGNOSIS — I509 Heart failure, unspecified: Secondary | ICD-10-CM

## 2017-01-21 NOTE — Patient Outreach (Signed)
[facility]) Care Management  [place]  01/21/2017   [name] [birth year] [id number]   Initial telephonic assessment completed.  Patient had recent hospitalization for CHF.   Encounter Medications:  Outpatient Encounter Prescriptions as of 01/21/2017  Medication Sig Note  . Alum & Mag Hydroxide-Simeth (MAGIC MOUTHWASH) SOLN Take 5 mLs by mouth 4 (four) times daily. Swish, hold and swallow   . amLODipine (NORVASC) 5 MG tablet Take 1 tablet (5 mg total) by mouth daily.   . dapsone 25 MG tablet Take 25 mg by mouth daily.    . diphenhydrAMINE (BENADRYL) 50 MG capsule Take 1 capsule (50 mg total) by mouth every 6 (six) hours as needed for itching.   . furosemide (LASIX) 20 MG tablet Take 3 tablets (60 mg total) by mouth daily.   Marland Kitchen gabapentin (NEURONTIN) 100 MG capsule Take 1 capsule (100 mg total) by mouth 3 (three) times daily. (Patient taking differently: Take 100 mg by mouth 2 (two) times daily. )   . HUMALOG KWIKPEN 100 UNIT/ML KiwkPen IF SUGAR:140-200 USE 8 UNITS,201-230 10 UNITS,231-260 12 UNITS,261-300 14 UNITS,>301 16 UNITS (Patient taking differently: Inject into the skin two to three times a day before a meal as needed per sliding scale: BGL 140-200 = 8 units; 201-230 = 10 units; 231-260 = 12 units; 261-300 = 14 units; >301 = 160 units) 12/27/2016: Dosage verified by the patient  . insulin degludec (TRESIBA FLEXTOUCH) 100 UNIT/ML SOPN FlexTouch Pen Inject 0.16 mLs (16 Units total) into the skin daily.   . Insulin Pen Needle 31G X 5 MM MISC Use to administer insulin four times a day Dx E11.9   . levothyroxine (SYNTHROID, LEVOTHROID) 25 MCG tablet TAKE 2 TABLETS (50 MCG TOTAL) BY MOUTH DAILY.   . metoprolol tartrate (LOPRESSOR) 25 MG tablet TAKE 1 TABLET BY MOUTH TWICE A DAY *NEEDS APPT FOR REFILLS (Patient taking differently: Take 25 mg by mouth two times a day)   . Multiple Vitamins-Minerals (CENTRUM SILVER 50+WOMEN) TABS Take 1 tablet by mouth daily with  breakfast.   . mycophenolate (MYFORTIC) 180 MG EC tablet Take 360 mg by mouth 2 (two) times daily.    [name] DELICA LANCETS 09B MISC Use to check blood sugars twice a day DX E11.9   . ONETOUCH VERIO test strip USE AS INSTRUCTED 4 TIMES A DAY (BEFORE MEALS) & AT BEDTIME (ICD 10-E11.9)   . oxyCODONE-acetaminophen (ROXICET) 5-325 MG tablet Take 0.5-1 tablets by mouth every 6 (six) hours as needed for severe pain. 12/27/2016: LF #100 on 11/18/16, per [facility]  . potassium chloride SA (K-DUR,KLOR-CON) 20 MEQ tablet Take 2 tablets (40 mEq total) by mouth daily.   . tacrolimus (PROGRAF) 1 MG capsule Take 4 mg by mouth 2 (two) times daily.    Marland Kitchen warfarin (COUMADIN) 5 MG tablet Take 1-1.5 tablets by mouth daily as directed by coumadin clinic (Patient taking differently: Take 5-7.5 mg by mouth See admin instructions. 5 mg at bedtime on Sun/Tues/Wed/Thurs/Sat and 7.5 mg on Mon/Fri) 12/27/2016: Regimen verified by the patient   No facility-administered encounter medications on file as of 01/21/2017.     Functional Status:  In your present state of health, do you have any difficulty performing the following activities: 01/21/2017 12/27/2016  Hearing? N N  Vision? N N  Difficulty concentrating or making decisions? N N  Walking or climbing stairs? Y Y  Dressing or bathing? N N  Doing errands, shopping? N N  Preparing Food and eating ?  N -  Using the Toilet? N -  In the past six months, have you accidently leaked urine? Y -  Do you have problems with loss of bowel control? N -  Managing your Medications? N -  Managing your Finances? N -  Housekeeping or managing your Housekeeping? N -  Some recent data might be hidden    Fall/Depression Screening: Fall Risk  01/21/2017 01/17/2017 11/21/2015  Falls in the past year? Yes No Yes  Number falls in past yr: 1 - 2 or more  Injury with Fall? No - No  Risk for fall due to : History of fall(s) - -  Follow up Falls evaluation completed;Falls prevention discussed;Education  provided - -   PHQ 2/9 Scores 01/21/2017 01/12/2017 11/21/2015 11/11/2014 06/04/2013  PHQ - 2 Score 0 0 0 0 0    Assessment: Patient appropriate for [facility] disease management services for CHF and diabetes.  [facility] CM Care Plan Problem One     Most Recent Value  Care Plan Problem One  Self-management deficits regarding CHF.  Role Documenting the Problem One  [place] for Problem One  Active  [facility] Long Term Goal   Patient will demonstrate improved self- management of CHF by avoiding readmission to the hospital during the next 90 days.  [facility] Long Term Goal Start Date  01/21/17  Interventions for Problem One Long Term Goal  Introduced CHF Action Plan using 'stop light' tool.  Discussed importance of calling MD for "yellow zone symptoms".  Will mail Action Plan to her.  [facility] CM Short Term Goal #1   Patient will be able to name at least 4 yellow zone symptoms and describe action needed should they occur within 30 days.  [facility] CM Short Term Goal #1 Start Date  01/21/17  Interventions for Short Term Goal #1  Discussion about importance of intervention when 'yellow zone symptoms" occur.    [facility] CM Care Plan Problem Two     Most Recent Value  Care Plan Problem Two  Self management deficts regarding diabetes.  Role Documenting the Problem Two  [place] for Problem Two  Active  Interventions for Problem Two Long Term Goal   Will mail diabetes educational literature for discussion in subsequent contacts.  [facility] Long Term Goal  Patient will demonstrate improved compliance with diabetic dietary guidelines within 60 days as evidenced by self- report of dietary changes and improvement in cbg readings.  [facility] Long Term Goal Start Date  01/21/17    [name] CM Care Plan Problem Three     Most Recent Value  Care Plan Problem Three  Patient is interested in completing advance directives.  Role Documenting the Problem [place] for Problem Three  Active  [facility] Long Term Goal    Patient will report completion of advance directives within 90 days.  [facility] Long Term Goal Start Date  01/21/17  Interventions for Problem Three Long Term Goal  Encouraged patient to review packet when it arrives, for follow up discussion during subsequent contacts.      Plan: Patient will record daily weights and cbgs           Patient will review and implement CHF Action Plan.           Patient will review Advanced Directive Packet for discussion next month.           RN will mail CHF Action Plan and Diabetic dietary guidelines.  RN will follow up in August.  Candie Mile, RN, MSN [place] [phone] Fax [phone]

## 2017-01-24 DIAGNOSIS — D899 Disorder involving the immune mechanism, unspecified: Secondary | ICD-10-CM | POA: Diagnosis not present

## 2017-01-24 DIAGNOSIS — R809 Proteinuria, unspecified: Secondary | ICD-10-CM | POA: Diagnosis not present

## 2017-01-24 DIAGNOSIS — E119 Type 2 diabetes mellitus without complications: Secondary | ICD-10-CM | POA: Diagnosis not present

## 2017-01-24 DIAGNOSIS — Z940 Kidney transplant status: Secondary | ICD-10-CM | POA: Diagnosis not present

## 2017-01-24 DIAGNOSIS — I10 Essential (primary) hypertension: Secondary | ICD-10-CM | POA: Diagnosis not present

## 2017-01-26 ENCOUNTER — Other Ambulatory Visit: Payer: Self-pay

## 2017-01-26 NOTE — Patient Outreach (Signed)
[name]) Care Management  01/26/2017  [name] 1946/04/27 [id number]   Telephone call to patient past receiving Red EMMI Alert for CHF- report of weight of 163 pounds.  HIPAA identifiers confirmed.  Patient reports weight of 163 pounds today and yesterday.  Her weight was also reported to be 163 pounds on 01-21-17.  Patient reports no SOB, and no edema.  She states she saw a kidney doctor yesterday and her weight with clothes on in the office was 164 pounds.  Reviewed importance of monitoring daily weights and using the stoplight tool to assess and report any "yellow zone symptoms". Encouraged patient to call with any questions or concerns.  RN will follow up within 2 weeks.  Candie Mile, RN, MSN [place] [phone] Fax [phone]

## 2017-02-07 ENCOUNTER — Ambulatory Visit (INDEPENDENT_AMBULATORY_CARE_PROVIDER_SITE_OTHER): Payer: Medicare Other | Admitting: Pharmacist

## 2017-02-07 DIAGNOSIS — Z5181 Encounter for therapeutic drug level monitoring: Secondary | ICD-10-CM | POA: Diagnosis not present

## 2017-02-07 DIAGNOSIS — I4891 Unspecified atrial fibrillation: Secondary | ICD-10-CM | POA: Diagnosis not present

## 2017-02-07 LAB — POCT INR: INR: 2.2

## 2017-02-09 ENCOUNTER — Ambulatory Visit
Admission: RE | Admit: 2017-02-09 | Discharge: 2017-02-09 | Disposition: A | Discharge status: Home / Self Care | Payer: Medicare Other | Source: Ambulatory Visit | Attending: Neurology | Admitting: Neurology

## 2017-02-09 DIAGNOSIS — M4802 Spinal stenosis, cervical region: Secondary | ICD-10-CM | POA: Diagnosis not present

## 2017-02-09 DIAGNOSIS — M6258 Muscle wasting and atrophy, not elsewhere classified, other site: Secondary | ICD-10-CM

## 2017-02-11 ENCOUNTER — Telehealth: Payer: Self-pay | Admitting: Neurology

## 2017-02-11 NOTE — Telephone Encounter (Signed)
Called and discussed results of MRI cervical spine which shows multilevel degenerative changes, worse at LEFT C5-6 and C7-T1, which does not explain her right sided arm pain and ROM.  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    NCS/EMG of the right arm was essentially normal, only very mild CTS.  Asymmetric of her arm size may be more so related to lipodystrophy as her motor strength is intact and symmetric on NCS.    Ronald Londo K. Posey Pronto, DO

## 2017-02-16 ENCOUNTER — Encounter: Payer: Self-pay | Admitting: Internal Medicine

## 2017-02-16 ENCOUNTER — Ambulatory Visit (INDEPENDENT_AMBULATORY_CARE_PROVIDER_SITE_OTHER): Payer: Medicare Other | Admitting: Internal Medicine

## 2017-02-16 ENCOUNTER — Ambulatory Visit: Payer: Self-pay

## 2017-02-16 DIAGNOSIS — R635 Abnormal weight gain: Secondary | ICD-10-CM | POA: Diagnosis not present

## 2017-02-16 DIAGNOSIS — Z794 Long term (current) use of insulin: Secondary | ICD-10-CM

## 2017-02-16 DIAGNOSIS — E1121 Type 2 diabetes mellitus with diabetic nephropathy: Secondary | ICD-10-CM | POA: Diagnosis not present

## 2017-02-16 DIAGNOSIS — R5382 Chronic fatigue, unspecified: Secondary | ICD-10-CM | POA: Diagnosis not present

## 2017-02-16 DIAGNOSIS — N183 Chronic kidney disease, stage 3 unspecified: Secondary | ICD-10-CM

## 2017-02-16 DIAGNOSIS — G8929 Other chronic pain: Secondary | ICD-10-CM | POA: Diagnosis not present

## 2017-02-16 DIAGNOSIS — M25511 Pain in right shoulder: Secondary | ICD-10-CM | POA: Diagnosis not present

## 2017-02-16 DIAGNOSIS — I5022 Chronic systolic (congestive) heart failure: Secondary | ICD-10-CM | POA: Diagnosis not present

## 2017-02-16 MED ORDER — OXYCODONE-ACETAMINOPHEN 5-325 MG PO TABS: 0.5000 | ORAL_TABLET | Freq: Four times a day (QID) | ORAL | 0 refills | Status: DC | PRN

## 2017-02-16 MED ORDER — METHYLPREDNISOLONE ACETATE 40 MG/ML IJ SUSP
40.0000 mg | Freq: Once | INTRAMUSCULAR | Status: AC
  Administered 2017-02-16: 40 mg via INTRA_ARTICULAR

## 2017-02-16 NOTE — Assessment & Plan Note (Signed)
Wt Readings from Last 3 Encounters:  02/16/17 166 lb (75.3 kg)  01/21/17 163 lb (73.9 kg)  01/17/17 163 lb (73.9 kg)

## 2017-02-16 NOTE — Patient Instructions (Signed)
Postprocedure instructions :    A Band-Aid should be left on for 12 hours. Injection therapy is not a cure itself. It is used in conjunction with other modalities. You can use nonsteroidal anti-inflammatories like ibuprofen , hot and cold compresses. Rest is recommended in the next 24 hours. You need to report immediately  if fever, chills or any signs of infection develop. 

## 2017-02-16 NOTE — Assessment & Plan Note (Signed)
BMET 

## 2017-02-16 NOTE — Assessment & Plan Note (Signed)
Metoprolol, Furosemide

## 2017-02-16 NOTE — Progress Notes (Signed)
Subjective:  [name], female    DOB: June 17, 1946  Age: 71 y.o. MRN: [id number]  CC: No chief complaint on file.   HPI [name] presents for CRF, HTN, neuropathy C/o R shoulder pain - worse...  Outpatient Medications Prior to Visit  Medication Sig Dispense Refill  . Alum & Mag Hydroxide-Simeth (MAGIC MOUTHWASH) SOLN Take 5 mLs by mouth 4 (four) times daily. Swish, hold and swallow 500 mL 1  . amLODipine (NORVASC) 5 MG tablet Take 1 tablet (5 mg total) by mouth daily. 30 tablet 1  . dapsone 25 MG tablet Take 25 mg by mouth daily.     . diphenhydrAMINE (BENADRYL) 50 MG capsule Take 1 capsule (50 mg total) by mouth every 6 (six) hours as needed for itching. 90 capsule 3  . gabapentin (NEURONTIN) 100 MG capsule Take 1 capsule (100 mg total) by mouth 3 (three) times daily. (Patient taking differently: Take 100 mg by mouth 2 (two) times daily. ) 270 capsule 1  . HUMALOG KWIKPEN 100 UNIT/ML KiwkPen IF SUGAR:140-200 USE 8 UNITS,201-230 10 UNITS,231-260 12 UNITS,261-300 14 UNITS,>301 16 UNITS (Patient taking differently: Inject into the skin two to three times a day before a meal as needed per sliding scale: BGL 140-200 = 8 units; 201-230 = 10 units; 231-260 = 12 units; 261-300 = 14 units; >301 = 160 units) 9 pen 11  . insulin degludec (TRESIBA FLEXTOUCH) 100 UNIT/ML SOPN FlexTouch Pen Inject 0.16 mLs (16 Units total) into the skin daily. 5 pen 1  . Insulin Pen Needle 31G X 5 MM MISC Use to administer insulin four times a day Dx E11.9 130 each 5  . KLOR-CON M20 20 MEQ tablet TAKE 2 TABLETS (40 MEQ TOTAL) BY MOUTH DAILY. 30 tablet 0  . levothyroxine (SYNTHROID, LEVOTHROID) 25 MCG tablet TAKE 2 TABLETS (50 MCG TOTAL) BY MOUTH DAILY. 60 tablet 11  . metoprolol tartrate (LOPRESSOR) 25 MG tablet TAKE 1 TABLET BY MOUTH TWICE A DAY *NEEDS APPT FOR REFILLS (Patient taking differently: Take 25 mg by mouth two times a day) 180 tablet 0  . Multiple Vitamins-Minerals (CENTRUM SILVER  50+WOMEN) TABS Take 1 tablet by mouth daily with breakfast.    . mycophenolate (MYFORTIC) 180 MG EC tablet Take 360 mg by mouth 2 (two) times daily.     [name] DELICA LANCETS 94B MISC Use to check blood sugars twice a day DX E11.9 100 each 5  . ONETOUCH VERIO test strip USE AS INSTRUCTED 4 TIMES A DAY (BEFORE MEALS) & AT BEDTIME (ICD 10-E11.9) 150 each 3  . oxyCODONE-acetaminophen (ROXICET) 5-325 MG tablet Take 0.5-1 tablets by mouth every 6 (six) hours as needed for severe pain. 100 tablet 0  . tacrolimus (PROGRAF) 1 MG capsule Take 4 mg by mouth 2 (two) times daily.     Marland Kitchen warfarin (COUMADIN) 5 MG tablet Take 1-1.5 tablets by mouth daily as directed by coumadin clinic (Patient taking differently: Take 5-7.5 mg by mouth See admin instructions. 5 mg at bedtime on Sun/Tues/Wed/Thurs/Sat and 7.5 mg on Mon/Fri) 120 tablet 0  . furosemide (LASIX) 20 MG tablet Take 3 tablets (60 mg total) by mouth daily. 90 tablet 11   No facility-administered medications prior to visit.     ROS Review of Systems  Constitutional: Negative for activity change, appetite change, chills, fatigue and unexpected weight change.  HENT: Negative for congestion, mouth sores and sinus pressure.   Eyes: Negative for visual disturbance.  Respiratory: Negative for cough  and chest tightness.   Gastrointestinal: Negative for abdominal pain and nausea.  Genitourinary: Negative for difficulty urinating, frequency and vaginal pain.  Musculoskeletal: Positive for arthralgias. Negative for back pain and gait problem.  Skin: Negative for pallor and rash.  Neurological: Negative for dizziness, tremors, weakness, numbness and headaches.  Psychiatric/Behavioral: Negative for confusion and sleep disturbance.    Objective:  BP (!) 142/70 (BP Location: Right Arm, Patient Position: Sitting, Cuff Size: Normal)   Pulse 63   Temp 98.4 F (36.9 C) (Oral)   Ht 5\' 1"  (1.549 m)   Wt 166 lb (75.3 kg)   SpO2 96%   BMI 31.37 kg/m   BP  Readings from Last 3 Encounters:  02/16/17 (!) 142/70  01/17/17 (!) 146/64  01/07/17 (!) 150/66    Wt Readings from Last 3 Encounters:  02/16/17 166 lb (75.3 kg)  01/21/17 163 lb (73.9 kg)  01/17/17 163 lb (73.9 kg)    Physical Exam  Constitutional: She appears well-developed. No distress.  HENT:  Head: Normocephalic.  Right Ear: External ear normal.  Left Ear: External ear normal.  Nose: Nose normal.  Mouth/Throat: Oropharynx is clear and moist.  Eyes: Pupils are equal, round, and reactive to light. Conjunctivae are normal. Right eye exhibits no discharge. Left eye exhibits no discharge.  Neck: Normal range of motion. Neck supple. No JVD present. No tracheal deviation present. No thyromegaly present.  Cardiovascular: Normal rate, regular rhythm and normal heart sounds.   Pulmonary/Chest: No stridor. No respiratory distress. She has no wheezes.  Abdominal: Soft. Bowel sounds are normal. She exhibits no distension and no mass. There is no tenderness. There is no rebound and no guarding.  Musculoskeletal: She exhibits tenderness. She exhibits no edema.  Lymphadenopathy:    She has no cervical adenopathy.  Neurological: She displays normal reflexes. No cranial nerve deficit. She exhibits normal muscle tone. Coordination normal.  Skin: No rash noted. No erythema.  Psychiatric: She has a normal mood and affect. Her behavior is normal. Judgment and thought content normal.  R shoulder is tender    Procedure :Joint Injection, R  shoulder   Indication:  Subacromial bursitis with refractory  chronic pain.   Risks including unsuccessful procedure , bleeding, infection, bruising, skin atrophy, "steroid flare-up" and others were explained to the patient in detail as well as the benefits. Informed consent was obtained and signed.   Tthe patient was placed in a comfortable position. Lateral approach was used. Skin was prepped with Betadine and alcohol  and anesthetized with a cooling spray.  Then, a 5 cc syringe with a 2 inch long 24-gauge needle was used for a joint injection.. The needle was advanced  Into the subacromial space.The bursa was injected with 3 mL of 2% lidocaine and 40 mg of Depo-Medrol .  Band-Aid was applied.   Tolerated well. Complications: None. Good pain relief following the procedure.      Lab Results  Component Value Date   WBC 8.4 01/07/2017   HGB 12.4 01/07/2017   HCT 39.9 01/07/2017   PLT 249.0 01/07/2017   GLUCOSE 128 (H) 01/07/2017   CHOL 174 11/18/2016   TRIG 131.0 11/18/2016   HDL 75.10 11/18/2016   LDLDIRECT 89.3 06/13/2006   LDLCALC 72 11/18/2016   ALT 17 01/07/2017   AST 23 01/07/2017   NA 141 01/07/2017   K 4.7 01/07/2017   CL 104 01/07/2017   CREATININE 1.54 (H) 01/07/2017   BUN 34 (H) 01/07/2017   CO2 32 01/07/2017  TSH 2.08 01/07/2017   INR 2.2 02/07/2017   HGBA1C 8.4 (H) 11/18/2016   MICROALBUR 317.3 (H) 07/02/2016    Mr Cervical Spine Wo Contrast  Result Date: 02/10/2017 CLINICAL DATA:  71 y/o F; neck pain and left shoulder pain for years. EXAM: MRI CERVICAL SPINE WITHOUT CONTRAST TECHNIQUE: Multiplanar, multisequence MR imaging of the cervical spine was performed. No intravenous contrast was administered. COMPARISON:  None. FINDINGS: Alignment: Straightening of cervical lordosis. Grade 1 C4-5 anterolisthesis. Vertebrae: Left-sided C7-T1 facet edema, likely degenerative. C5-6 degenerative endplate edema. No evidence for acute fracture or discitis. Cord: Normal signal and morphology. Posterior Fossa, vertebral arteries, paraspinal tissues: Negative. Disc levels: C2-3: No significant disc displacement, foraminal narrowing, or canal stenosis. Mild bilateral facet hypertrophy. C3-4: No significant disc displacement, foraminal narrowing, or canal stenosis. Moderate bilateral facet hypertrophy and arthrodesis. C4-5: Grade 1 anterolisthesis with small uncovered disc osteophyte complex and bilateral uncovertebral/ facet hypertrophy.  Mild bilateral foraminal stenosis. Mild canal stenosis. C5-6: Moderate disc osteophyte complex with uncovertebral and facet hypertrophy. Moderate right and severe left foraminal stenosis. Mild canal stenosis. C6-7: Moderate disc osteophyte complex with mild facet and uncovertebral hypertrophy greater on the right. Moderate right and mild left foraminal stenosis. Mild canal stenosis. C7-T1: No significant disc displacement, foraminal narrowing, or canal stenosis. 7 mm right nerve root sheath cyst. IMPRESSION: 1. C5-6 degenerative endplate edema and left-sided C7-T1 degenerative facet edema. 2. No abnormal cord signal or compression. 3. Cervical spondylosis with multifactorial mild C4-5 to C6-7 canal stenosis. 4. Multilevel mild and moderate foraminal stenosis. Severe left C5-6 foraminal stenosis. Electronically Signed   By: Kristine Garbe M.D.   On: 02/10/2017 00:14    Assessment & Plan:   There are no diagnoses linked to this encounter. I am having [name] maintain her mycophenolate, tacrolimus, dapsone, diphenhydrAMINE, magic mouthwash, levothyroxine, insulin degludec, HUMALOG KWIKPEN, gabapentin, ONETOUCH DELICA LANCETS 92Z, Insulin Pen Needle, ONETOUCH VERIO, oxyCODONE-acetaminophen, warfarin, metoprolol tartrate, CENTRUM SILVER 50+WOMEN, amLODipine, furosemide, and KLOR-CON M20.  No orders of the defined types were placed in this encounter.    Follow-up: No Follow-up on file.  Walker Kehr, MD

## 2017-02-16 NOTE — Assessment & Plan Note (Signed)
[name] - not using On Humalog SS

## 2017-02-16 NOTE — Assessment & Plan Note (Signed)
Discussed.

## 2017-02-16 NOTE — Assessment & Plan Note (Addendum)
[name] asked to inject Oxy prn  Potential benefits of a long term opioids use as well as potential risks (i.e. addiction risk, apnea etc) and complications (i.e. Somnolence, constipation and others) were explained to the patient and were aknowledged.

## 2017-02-22 ENCOUNTER — Other Ambulatory Visit: Payer: Self-pay

## 2017-02-22 ENCOUNTER — Other Ambulatory Visit: Payer: Self-pay | Admitting: Internal Medicine

## 2017-02-22 VITALS — Wt 163.0 lb

## 2017-02-22 DIAGNOSIS — I509 Heart failure, unspecified: Secondary | ICD-10-CM

## 2017-02-22 NOTE — Patient Outreach (Signed)
[place] [facility]) Care Management  02/22/2017  Sherle Mello Deremer Nov 16, 1945 [id number]   Telephone assessment with patient.  She reports she saw PCP last week and was given an injection for her shoulder pain.  States she is still having pain, which she rated at a 3-4 on a 0-10 scale, but states she is hoping the shot will work soon.  Patient reports cbgs have been elevated.  She reports fasting cbgs are usually in the low 200s, and that non-fasting cbgs are usually around 280.  She uses her sliding scale insulin to bring cbg down to around 150.  Patient also reports occasional hypoglycemic episodes in early morning around 60-70, for which she eats something sweet. Patient reports difficulty adhering to diabetic dietary guidelines.  She states her husband cooks with added salt and sugar.  Patient also admits to eating cheese cake as a bedtime snack. RN provided education re dietary guidelines and healthy bedtime snacks.  Patient reports weights have been steady, and that her weight yesterday was 163 pounds.  She reports no issues with swollen feet/legs, and no problem with SOB except with exertion.  Patient reports difficulty keeping up with all of her medications, dietary guidelines, and self-management of her diabetes and CHF.  She reports she does use a pill box to try and keep up with her medications. She has sole responsibility for her 72 year old great grandson, and reports minimal support from her family.  She did say that she is going to try to get the child into day care at least 2 days a week, but there is a waiting list for admission.  Plan:  Patient will attempt to adopt dietary guidelines for a low salt, diabetic diet.           Patient will continue daily weights, and use the "stop light" tool to monitor for changes in her CHF status.           RN will resend packet of information on advance directives.           RN will notify PCP of diabetic management issues.           RN  will follow up in approximately one month.  Candie Mile, RN, MSN [place] [phone] Fax [phone]

## 2017-02-23 ENCOUNTER — Ambulatory Visit: Payer: Medicare Other | Admitting: Podiatry

## 2017-02-24 ENCOUNTER — Ambulatory Visit: Payer: Medicare Other

## 2017-02-25 DIAGNOSIS — N184 Chronic kidney disease, stage 4 (severe): Secondary | ICD-10-CM | POA: Diagnosis not present

## 2017-03-02 ENCOUNTER — Encounter: Payer: Self-pay | Admitting: Cardiology

## 2017-03-07 ENCOUNTER — Ambulatory Visit (INDEPENDENT_AMBULATORY_CARE_PROVIDER_SITE_OTHER): Payer: Medicare Other | Admitting: Pharmacist Clinician (PhC)/ Clinical Pharmacy Specialist

## 2017-03-07 DIAGNOSIS — I4891 Unspecified atrial fibrillation: Secondary | ICD-10-CM

## 2017-03-07 DIAGNOSIS — N184 Chronic kidney disease, stage 4 (severe): Secondary | ICD-10-CM | POA: Diagnosis not present

## 2017-03-07 DIAGNOSIS — Z5181 Encounter for therapeutic drug level monitoring: Secondary | ICD-10-CM | POA: Diagnosis not present

## 2017-03-07 DIAGNOSIS — N2581 Secondary hyperparathyroidism of renal origin: Secondary | ICD-10-CM | POA: Diagnosis not present

## 2017-03-07 DIAGNOSIS — E119 Type 2 diabetes mellitus without complications: Secondary | ICD-10-CM | POA: Diagnosis not present

## 2017-03-07 LAB — POCT INR: INR: 1.5

## 2017-03-09 ENCOUNTER — Other Ambulatory Visit: Payer: Self-pay | Admitting: Cardiology

## 2017-03-14 NOTE — Progress Notes (Signed)
HPI: FU aortic stenosis s/p AVR and atrial fibrillation. Patient has a history of embolic CVA. She has a history of coronary artery disease, status post PCI of the right coronary artery. She underwent cardiac catheterization in September 2014 for severe AS. The right coronary was totally occluded but no other coronary disease noted. She was scheduled for surgery but had a sheath placed into her right carotid artery by mistake. This required surgical repair. Patient subsequently had aortic valve replacement with a pericardial tissue valve. She also had a Maze procedure performed. Carotid dopplers 9/15 showed 1-39 bilateral stenosis and fu recommended two years; abnormal density in right thyroid; dedicated thyroid ultrasound showed 2 thyroid nodules; Bx showed benign follicular nodule. Chest CT April 2017 showed right upper lobe nodule and follow-up recommended in 24 months. Echo 6/18 showed normal LV function, moderate AS (mean gradient 24 mmHg), mild MR, moderate LAE. ABIs March 2018 moderate on the right and mild on the left. Remainder of study suggested multilevel arterial occlusive disease. Since she was last seen the patient has dyspnea with more extreme activities but not with routine activities. It is relieved with rest. It is not associated with chest pain. There is no orthopnea, PND or pedal edema. There is no syncope or palpitations. There is no exertional chest pain. Claudication right lower extremity with walking to driveway.   Current Outpatient Prescriptions  Medication Sig Dispense Refill  . Alum & Mag Hydroxide-Simeth (MAGIC MOUTHWASH) SOLN Take 5 mLs by mouth 4 (four) times daily. Swish, hold and swallow 500 mL 1  . amLODipine (NORVASC) 5 MG tablet Take 1 tablet (5 mg total) by mouth daily. 30 tablet 1  . dapsone 25 MG tablet Take 25 mg by mouth daily.     . diphenhydrAMINE (BENADRYL) 50 MG capsule Take 1 capsule (50 mg total) by mouth every 6 (six) hours as needed for itching. 90  capsule 3  . gabapentin (NEURONTIN) 100 MG capsule Take 1 capsule (100 mg total) by mouth 3 (three) times daily. (Patient taking differently: Take 100 mg by mouth 2 (two) times daily. ) 270 capsule 1  . HUMALOG KWIKPEN 100 UNIT/ML KiwkPen IF SUGAR:140-200 USE 8 UNITS,201-230 10 UNITS,231-260 12 UNITS,261-300 14 UNITS,>301 16 UNITS (Patient taking differently: Inject into the skin two to three times a day before a meal as needed per sliding scale: BGL 140-200 = 8 units; 201-230 = 10 units; 231-260 = 12 units; 261-300 = 14 units; >301 = 160 units) 9 pen 11  . Insulin Pen Needle 31G X 5 MM MISC Use to administer insulin four times a day Dx E11.9 130 each 5  . KLOR-CON M20 20 MEQ tablet TAKE 2 TABLETS (40 MEQ TOTAL) BY MOUTH DAILY. 60 tablet 5  . levothyroxine (SYNTHROID, LEVOTHROID) 25 MCG tablet TAKE 2 TABLETS (50 MCG TOTAL) BY MOUTH DAILY. 60 tablet 11  . metoprolol tartrate (LOPRESSOR) 25 MG tablet TAKE 1 TABLET BY MOUTH TWICE A DAY *NEEDS APPT FOR REFILLS (Patient taking differently: Take 25 mg by mouth two times a day) 180 tablet 0  . Multiple Vitamins-Minerals (CENTRUM SILVER 50+WOMEN) TABS Take 1 tablet by mouth daily with breakfast.    . mycophenolate (MYFORTIC) 180 MG EC tablet Take 360 mg by mouth 2 (two) times daily.     [name] DELICA LANCETS 42V MISC Use to check blood sugars twice a day DX E11.9 100 each 5  . ONETOUCH VERIO test strip USE AS INSTRUCTED 4 TIMES A DAY (BEFORE  MEALS) & AT BEDTIME (ICD 10-E11.9) 150 each 3  . oxyCODONE-acetaminophen (ROXICET) 5-325 MG tablet Take 0.5-1 tablets by mouth every 6 (six) hours as needed for severe pain. 100 tablet 0  . tacrolimus (PROGRAF) 1 MG capsule Take 4 mg by mouth 2 (two) times daily.     Marland Kitchen warfarin (COUMADIN) 5 MG tablet TAKE 1 TO 1.5 TABLETS BY MOUTH DAILY AS DIRECTED BY COUMADIN CLINIC 120 tablet 0  . furosemide (LASIX) 20 MG tablet Take 3 tablets (60 mg total) by mouth daily. 90 tablet 11   No current facility-administered medications  for this visit.      Past Medical History:  Diagnosis Date  . Anemia, iron deficiency    of chronic disease  . Aortic stenosis    a. Severe AS by echo 11/2012.  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 Asystole ([place])    a. During ENT surgery 2005: developed marked asystole requiring CPR, felt due to vagal reaction (cath nonobst dz).  . Carotid artery disease ([place])    a. Carotid Dopplers performed in August 2013 showed 40-59% left stenosis and 0-39% right; f/u recommended in 2 years.   . Cerebrovascular accident [facility]) 2009   a. LMCA infarct felt embolic 6010, maintained on chronic coumadin.; denies residual on 04/05/2013  . CHF (congestive heart failure) ([place])   . Cholelithiasis   . Chronic cough onset 03/2010   Dr Melvyn Novas  . Chronic Persistent Atrial Fibrillation 12/31/2008   Qualifier: Diagnosis of  By: [name]    . Coronary artery disease 05/2002   a. Ant MI 2003 s/p PTCA/stent to RCA.   . Diverticulosis of colon   . Esophagitis, reflux   . ESRD (end stage renal disease) ([place])    a. Mass on L kidney per pt s/p nephrectomy - pt states not cancer - [facility] notes indicate ESRD due to HTN/DM - was previously on HD. b. Kidney transplant 02/2011.  Marland Kitchen GERD (gastroesophageal reflux disease)   . Gout   . Helicobacter pylori (H. pylori) infection    hx of  . Hemorrhoids   . Hx of colonic polyps    adenomatous  . Hyperlipidemia   . Hypertension   . Lung nodule seen on imaging study 04/07/2013   1.0 cm ground glass opacity RUL  . Myocardial infarction ([place]) 2003  . Pericardial effusion    a. Small by echo 11/2011.  Marland Kitchen Pruritic dermatitis    treated with steriods/UV light  . S/P aortic valve replacement with bioprosthetic valve and maze procedure 04/12/2013   14mm [facility] Ease bovine pericardial tissue valve   . S/P Maze operation for atrial fibrillation 04/12/2013   Complete bilateral atrial lesion set using bipolar radiofrequency and cryothermy ablation with  clipping of LA appendage  . Sleep apnea    Pt says testing was positive, intolerant of CPAP.  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  . Type II diabetes mellitus ([place])   . Unspecified hearing loss     Past Surgical History:  Procedure Laterality Date  . AORTIC VALVE REPLACEMENT N/A 04/12/2013   Procedure: AORTIC VALVE REPLACEMENT (AVR);  Surgeon: Rexene Alberts, MD;  Location: [place];  Service: Open Heart Surgery;  Laterality: N/A;  . ARTERIOVENOUS GRAFT PLACEMENT Left   . ARTERIOVENOUS GRAFT PLACEMENT Left    "I've had 2  on my left; had one removed" (04/05/2013)   . ARTERY EXPLORATION Right 04/11/2013   Procedure: ARTERY EXPLORATION;  Surgeon: Rexene Alberts, MD;  Location: [place];  Service: Open Heart Surgery;  Laterality: Right;  Right carotid artery exploration  . AV FISTULA PLACEMENT Right   . AV FISTULA REPAIR Right    "took it out" ((/18/2014)  . CARDIOVERSION  05/29/2012   Procedure: CARDIOVERSION;  Surgeon: Lelon Perla, MD;  Location: [facility] ENDOSCOPY;  Service: Cardiovascular;  Laterality: N/A;  . CHOLECYSTECTOMY  2009   with hernia removal  . CORONARY ANGIOPLASTY WITH STENT PLACEMENT Right    coronary artery  . INSERTION OF DIALYSIS CATHETER Bilateral    "over the years; took them both out" (04/05/2013)  . INTRAOPERATIVE TRANSESOPHAGEAL ECHOCARDIOGRAM N/A 04/11/2013   Procedure: INTRAOPERATIVE TRANSESOPHAGEAL ECHOCARDIOGRAM;  Surgeon: Rexene Alberts, MD;  Location: [place];  Service: Open Heart Surgery;  Laterality: N/A;  . INTRAOPERATIVE TRANSESOPHAGEAL ECHOCARDIOGRAM N/A 04/12/2013   Procedure: INTRAOPERATIVE TRANSESOPHAGEAL ECHOCARDIOGRAM;  Surgeon: Rexene Alberts, MD;  Location: [place];  Service: Open Heart Surgery;  Laterality: N/A;  . KIDNEY TRANSPLANT  03/16/11  . LEFT AND RIGHT HEART CATHETERIZATION WITH  CORONARY ANGIOGRAM N/A 04/06/2013   Procedure: LEFT AND RIGHT HEART CATHETERIZATION WITH CORONARY ANGIOGRAM;  Surgeon: Blane Ohara, MD;  Location: [facility] CATH LAB;  Service: Cardiovascular;  Laterality: N/A;  . MAZE N/A 04/12/2013   Procedure: MAZE;  Surgeon: Rexene Alberts, MD;  Location: [place];  Service: Open Heart Surgery;  Laterality: N/A;  . NASAL RECONSTRUCTION WITH SEPTAL REPAIR     "took it out" (04/05/2013)  . NEPHRECTOMY Left 2010   no CA on bx  . TONSILLECTOMY    . TOTAL ABDOMINAL HYSTERECTOMY    . TUBAL LIGATION      Social History   Social History  . Marital status: Married    Spouse name: N/A  . Number of children: 5  . Years of education: 15   Occupational History  . retired Retired   Social History Main Topics  . Smoking status: Former Smoker    Packs/day: 1.00    Years: 30.00    Types: Cigarettes    Quit date: 07/19/2001  . Smokeless tobacco: Never Used  . Alcohol use No  . Drug use: No  . Sexual activity: Not Currently   Other Topics Concern  . Not on file   Social History Narrative   Patient signed a Designated Party Release to allow her spouse [name] and family and five children to have access to her medical records/information.     Patient lives with husband in a 2 story home.  Has 5 children. Retired from Personal assistant.  Education: 3 years of college.     Family History  Problem Relation Age of Onset  . Stroke Father   . Hypertension Mother   . Diabetes Other   . Diabetes Maternal Grandmother   . Diabetes Son     ROS: no fevers or chills, productive cough, hemoptysis, dysphasia, odynophagia, melena, hematochezia, dysuria, hematuria, rash, seizure activity, orthopnea, PND, pedal edema, claudication. Remaining systems are negative.  Physical Exam: Well-developed well-nourished in no acute distress.  Skin is warm and dry.  HEENT is normal.  Neck is supple.  Chest is clear to auscultation with normal expansion.  Cardiovascular exam is  regular rate and rhythm. 2/6 systolic murmur Abdominal exam nontender or distended. No masses palpated. Extremities show no edema. neuro grossly intact  A/P  1 Status post aortic valve replacement-continue SBE prophylaxis.  2 hypertension-blood pressure is elevated. Increase amlodipine to 10 mg daily and follow.  3 hyperlipidemia-patient's statin apparently was discontinued. I am unclear for the reason. I will ask her to discuss this with nephrology and would like for her to be on this long-term if able.  4 coronary artery disease-No aspirin given need for anticoagulation.  5 chronic diastolic congestive heart failure-patient appears to be euvolemic. Continue present dose of Lasix.  6 paroxysmal atrial fibrillation-patient remains in sinus rhythm. Continue beta blocker and Coumadin.  7 carotid artery disease-will arrange follow-up carotid Dopplers.  8 lung nodule-needs follow-up chest CT April 2019.  9 claudication-RLE; will arrange evaluation with Dr Fletcher Anon or Dr Gwenlyn Found.  [name], MD

## 2017-03-17 DIAGNOSIS — D899 Disorder involving the immune mechanism, unspecified: Secondary | ICD-10-CM | POA: Diagnosis not present

## 2017-03-17 DIAGNOSIS — Z940 Kidney transplant status: Secondary | ICD-10-CM | POA: Diagnosis not present

## 2017-03-17 DIAGNOSIS — Z87891 Personal history of nicotine dependence: Secondary | ICD-10-CM | POA: Diagnosis not present

## 2017-03-17 DIAGNOSIS — Z8679 Personal history of other diseases of the circulatory system: Secondary | ICD-10-CM | POA: Diagnosis not present

## 2017-03-17 DIAGNOSIS — E1122 Type 2 diabetes mellitus with diabetic chronic kidney disease: Secondary | ICD-10-CM | POA: Diagnosis not present

## 2017-03-17 DIAGNOSIS — I120 Hypertensive chronic kidney disease with stage 5 chronic kidney disease or end stage renal disease: Secondary | ICD-10-CM | POA: Diagnosis not present

## 2017-03-17 DIAGNOSIS — N186 End stage renal disease: Secondary | ICD-10-CM | POA: Diagnosis not present

## 2017-03-17 DIAGNOSIS — Z79899 Other long term (current) drug therapy: Secondary | ICD-10-CM | POA: Diagnosis not present

## 2017-03-17 DIAGNOSIS — E119 Type 2 diabetes mellitus without complications: Secondary | ICD-10-CM | POA: Diagnosis not present

## 2017-03-17 DIAGNOSIS — Z794 Long term (current) use of insulin: Secondary | ICD-10-CM | POA: Diagnosis not present

## 2017-03-17 DIAGNOSIS — I10 Essential (primary) hypertension: Secondary | ICD-10-CM | POA: Diagnosis not present

## 2017-03-22 ENCOUNTER — Other Ambulatory Visit: Payer: Self-pay

## 2017-03-22 VITALS — Ht 61.0 in | Wt 165.0 lb

## 2017-03-22 DIAGNOSIS — I509 Heart failure, unspecified: Secondary | ICD-10-CM

## 2017-03-22 NOTE — Patient Outreach (Signed)
[place] [name]) Care Management  03/22/2017  [name] [birth year] [id number]   Monthly assessment completed.  Patient states she continues to struggle with management of her heart failure and her diabetes.  She reports her weights are steady around 163-165, and she denies swollen extremities. She also reports issues with limiting salt in her diet, and states that she has chronic fatigue.  She states she has an appt. with her cardiologist tomorrow.  Patient reports fasting cbgs have been around 200 (197 today).  She also states that non-fasting readings are in the 200s.  She uses her sliding scale insulin to control hyperglycemic episodes. An A1C lab taken on 03-17-17 was 7.6.  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  She has not yet been able to arrange day care to assist her with the responsibility of the child's care.  She states he is on a waiting list for placement. She admits to feeling overwhelmed with all of the issues she is dealing with.  Plan: Patient will keep MD appt. scheduled for tomorrow.          Patient will continue daily weights and cbg checks.          Patient will continue efforts to make healthy food choices.          RN will request new advance directives folder be mailed to patient.          RN will request PCP order a nutrition consult.          Patient will explore day care options for her great grandson.          RN will follow up in October.  Candie Mile, RN, MSN [place] [phone] Fax [phone]

## 2017-03-23 ENCOUNTER — Ambulatory Visit (INDEPENDENT_AMBULATORY_CARE_PROVIDER_SITE_OTHER): Payer: Medicare Other | Admitting: Cardiology

## 2017-03-23 ENCOUNTER — Ambulatory Visit (INDEPENDENT_AMBULATORY_CARE_PROVIDER_SITE_OTHER): Payer: Medicare Other | Admitting: Pharmacist

## 2017-03-23 ENCOUNTER — Encounter: Payer: Self-pay | Admitting: Cardiology

## 2017-03-23 VITALS — BP 160/60 | HR 50 | Ht 61.0 in | Wt 168.2 lb

## 2017-03-23 DIAGNOSIS — Z940 Kidney transplant status: Secondary | ICD-10-CM

## 2017-03-23 DIAGNOSIS — I151 Hypertension secondary to other renal disorders: Secondary | ICD-10-CM

## 2017-03-23 DIAGNOSIS — Z5181 Encounter for therapeutic drug level monitoring: Secondary | ICD-10-CM | POA: Diagnosis not present

## 2017-03-23 DIAGNOSIS — I679 Cerebrovascular disease, unspecified: Secondary | ICD-10-CM

## 2017-03-23 DIAGNOSIS — Z952 Presence of prosthetic heart valve: Secondary | ICD-10-CM

## 2017-03-23 DIAGNOSIS — I4891 Unspecified atrial fibrillation: Secondary | ICD-10-CM

## 2017-03-23 LAB — POCT INR: INR: 3.3

## 2017-03-23 MED ORDER — AMLODIPINE BESYLATE 10 MG PO TABS
10.0000 mg | ORAL_TABLET | Freq: Every day | ORAL | 3 refills | Status: DC
Start: 2017-03-23 — End: 2017-06-14

## 2017-03-23 NOTE — Patient Instructions (Signed)
Medication Instructions:   INCREASE AMLODIPINE TOP 10 MG ONCE DAILY= 2 OF THE 5 MG TABLETS ONCE DAILY  Testing/Procedures:  Your physician has requested that you have a carotid duplex. This test is an ultrasound of the carotid arteries in your neck. It looks at blood flow through these arteries that supply the brain with blood. Allow one hour for this exam. There are no restrictions or special instructions.    Follow-Up:  Your physician wants you to follow-up in: [place] will receive a reminder letter in the mail two months in advance. If you don't receive a letter, please call our office to schedule the follow-up appointment.   REFERRAL TO DR Fletcher Anon FOR CLAUDICATION  If you need a refill on your cardiac medications before your next appointment, please call your pharmacy.

## 2017-03-24 ENCOUNTER — Other Ambulatory Visit: Payer: Self-pay

## 2017-03-24 NOTE — Patient Outreach (Signed)
[facility]) Care Management  03/24/2017  [name] [birth year] [id number]   Telephone call to patient.  She had requested information on child care services for her grandson. Contact number given to her.    Candie Mile, RN, MSN [place] [phone] Fax [phone]

## 2017-03-28 ENCOUNTER — Ambulatory Visit (HOSPITAL_COMMUNITY)
Admission: RE | Admit: 2017-03-28 | Discharge: 2017-03-28 | Disposition: A | Discharge status: Home / Self Care | Payer: Medicare Other | Source: Ambulatory Visit | Attending: Cardiology | Admitting: Cardiology

## 2017-03-28 DIAGNOSIS — E785 Hyperlipidemia, unspecified: Secondary | ICD-10-CM | POA: Diagnosis not present

## 2017-03-28 DIAGNOSIS — I6523 Occlusion and stenosis of bilateral carotid arteries: Secondary | ICD-10-CM | POA: Diagnosis not present

## 2017-03-28 DIAGNOSIS — I10 Essential (primary) hypertension: Secondary | ICD-10-CM | POA: Insufficient documentation

## 2017-03-28 DIAGNOSIS — E119 Type 2 diabetes mellitus without complications: Secondary | ICD-10-CM | POA: Insufficient documentation

## 2017-03-28 DIAGNOSIS — I679 Cerebrovascular disease, unspecified: Secondary | ICD-10-CM | POA: Diagnosis not present

## 2017-03-28 DIAGNOSIS — I2510 Atherosclerotic heart disease of native coronary artery without angina pectoris: Secondary | ICD-10-CM | POA: Insufficient documentation

## 2017-03-28 DIAGNOSIS — Z87891 Personal history of nicotine dependence: Secondary | ICD-10-CM | POA: Diagnosis not present

## 2017-04-01 ENCOUNTER — Other Ambulatory Visit: Payer: Self-pay | Admitting: Cardiology

## 2017-04-04 ENCOUNTER — Other Ambulatory Visit: Payer: Self-pay | Admitting: Interventional Radiology

## 2017-04-04 ENCOUNTER — Other Ambulatory Visit: Payer: Self-pay | Admitting: Internal Medicine

## 2017-04-04 DIAGNOSIS — Z1231 Encounter for screening mammogram for malignant neoplasm of breast: Secondary | ICD-10-CM

## 2017-04-04 DIAGNOSIS — M79604 Pain in right leg: Secondary | ICD-10-CM

## 2017-04-04 DIAGNOSIS — M79605 Pain in left leg: Principal | ICD-10-CM

## 2017-04-05 ENCOUNTER — Ambulatory Visit (INDEPENDENT_AMBULATORY_CARE_PROVIDER_SITE_OTHER): Payer: Medicare Other | Admitting: Orthotics

## 2017-04-05 DIAGNOSIS — M21619 Bunion of unspecified foot: Secondary | ICD-10-CM | POA: Diagnosis not present

## 2017-04-05 DIAGNOSIS — M79673 Pain in unspecified foot: Secondary | ICD-10-CM

## 2017-04-05 DIAGNOSIS — M79672 Pain in left foot: Secondary | ICD-10-CM

## 2017-04-05 DIAGNOSIS — M79671 Pain in right foot: Secondary | ICD-10-CM

## 2017-04-05 DIAGNOSIS — E1140 Type 2 diabetes mellitus with diabetic neuropathy, unspecified: Secondary | ICD-10-CM

## 2017-04-05 DIAGNOSIS — M2020 Hallux rigidus, unspecified foot: Secondary | ICD-10-CM

## 2017-04-05 DIAGNOSIS — E1149 Type 2 diabetes mellitus with other diabetic neurological complication: Secondary | ICD-10-CM

## 2017-04-05 DIAGNOSIS — E1151 Type 2 diabetes mellitus with diabetic peripheral angiopathy without gangrene: Secondary | ICD-10-CM

## 2017-04-07 DIAGNOSIS — Z4822 Encounter for aftercare following kidney transplant: Secondary | ICD-10-CM | POA: Diagnosis not present

## 2017-04-07 DIAGNOSIS — Z940 Kidney transplant status: Secondary | ICD-10-CM | POA: Diagnosis not present

## 2017-04-12 ENCOUNTER — Encounter: Payer: Self-pay | Admitting: Cardiovascular Disease

## 2017-04-12 ENCOUNTER — Ambulatory Visit (INDEPENDENT_AMBULATORY_CARE_PROVIDER_SITE_OTHER): Payer: Medicare Other | Admitting: Cardiovascular Disease

## 2017-04-12 VITALS — BP 149/65 | HR 54 | Ht 61.0 in | Wt 172.4 lb

## 2017-04-12 DIAGNOSIS — E785 Hyperlipidemia, unspecified: Secondary | ICD-10-CM

## 2017-04-12 DIAGNOSIS — I2510 Atherosclerotic heart disease of native coronary artery without angina pectoris: Secondary | ICD-10-CM

## 2017-04-12 DIAGNOSIS — I739 Peripheral vascular disease, unspecified: Secondary | ICD-10-CM | POA: Diagnosis not present

## 2017-04-12 DIAGNOSIS — Z952 Presence of prosthetic heart valve: Secondary | ICD-10-CM

## 2017-04-12 NOTE — Progress Notes (Signed)
Cardiology Office Note   Date:  04/12/2017   ID:  [name], DOB 08-31-1945, MRN [id number]  PCP:  [name], MD  Cardiologist:  Dr. [name]  No chief complaint on file.     History of Present Illness: [name] is a 71 y.o. female who was referred by Dr. [name] for evaluation management of peripheral arterial disease. The patient has extensive cardiac and medical problems. She has known history of aortic valve stenosis status post bioprosthetic aortic valve replacement, atrial fibrillation status post maze procedure, previous embolic stroke , coronary artery disease, chronic kidney disease status post kidney transplant in 2012 on the left side, hypertension and previous tobacco use. The patient reports prolonged history of bilateral leg pain with walking worse on the right side. She had lower extremity arterial Doppler in March 2018 which showed an ABI of 0.47 on the right and 0.71 on the left. She develops right calf claudication after walking about 100 feet and she gets milder left calf discomfort as well. She is limited due to this and also due to shortness of breath. No pain at rest or lower extremity ulceration.  Past Medical History:  Diagnosis Date  . Anemia, iron deficiency    of chronic disease  . Aortic stenosis    a. Severe AS by echo 11/2012.  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 Asystole ([place])    a. During ENT surgery 2005: developed marked asystole requiring CPR, felt due to vagal reaction (cath nonobst dz).  . Carotid artery disease ([place])    a. Carotid Dopplers performed in August 2013 showed 40-59% left stenosis and 0-39% right; f/u recommended in 2 years.   . Cerebrovascular accident [facility]) 2009   a. LMCA infarct felt embolic 5643, maintained on chronic coumadin.; denies residual on 04/05/2013  . CHF (congestive heart failure) ([place])   . Cholelithiasis   . Chronic cough onset 03/2010   Dr Melvyn Novas  . Chronic Persistent  Atrial Fibrillation 12/31/2008   Qualifier: Diagnosis of  By: [name]    . Coronary artery disease 05/2002   a. Ant MI 2003 s/p PTCA/stent to RCA.   . Diverticulosis of colon   . Esophagitis, reflux   . ESRD (end stage renal disease) ([place])    a. Mass on L kidney per pt s/p nephrectomy - pt states not cancer - [facility] notes indicate ESRD due to HTN/DM - was previously on HD. b. Kidney transplant 02/2011.  Marland Kitchen GERD (gastroesophageal reflux disease)   . Gout   . Helicobacter pylori (H. pylori) infection    hx of  . Hemorrhoids   . Hx of colonic polyps    adenomatous  . Hyperlipidemia   . Hypertension   . Lung nodule seen on imaging study 04/07/2013   1.0 cm ground glass opacity RUL  . Myocardial infarction ([place]) 2003  . Pericardial effusion    a. Small by echo 11/2011.  Marland Kitchen Pruritic dermatitis    treated with steriods/UV light  . S/P aortic valve replacement with bioprosthetic valve and maze procedure 04/12/2013   45mm [facility] Ease bovine pericardial tissue valve   . S/P Maze operation for atrial fibrillation 04/12/2013   Complete bilateral atrial lesion set using bipolar radiofrequency and cryothermy ablation with clipping of LA appendage  . Sleep apnea    Pt says testing was positive, intolerant of CPAP.  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  . Type II diabetes mellitus ([place])   . Unspecified hearing loss     Past Surgical History:  Procedure Laterality Date  . AORTIC VALVE REPLACEMENT N/A 04/12/2013   Procedure: AORTIC VALVE REPLACEMENT (AVR);  Surgeon: Rexene Alberts, MD;  Location: [place];  Service: Open Heart Surgery;  Laterality: N/A;  . ARTERIOVENOUS GRAFT PLACEMENT Left   . ARTERIOVENOUS GRAFT PLACEMENT Left    "I've had 2 on my left; had one removed" (04/05/2013)   . ARTERY EXPLORATION Right 04/11/2013    Procedure: ARTERY EXPLORATION;  Surgeon: Rexene Alberts, MD;  Location: [place];  Service: Open Heart Surgery;  Laterality: Right;  Right carotid artery exploration  . AV FISTULA PLACEMENT Right   . AV FISTULA REPAIR Right    "took it out" ((/18/2014)  . CARDIOVERSION  05/29/2012   Procedure: CARDIOVERSION;  Surgeon: Lelon Perla, MD;  Location: [facility] ENDOSCOPY;  Service: Cardiovascular;  Laterality: N/A;  . CHOLECYSTECTOMY  2009   with hernia removal  . CORONARY ANGIOPLASTY WITH STENT PLACEMENT Right    coronary artery  . INSERTION OF DIALYSIS CATHETER Bilateral    "over the years; took them both out" (04/05/2013)  . INTRAOPERATIVE TRANSESOPHAGEAL ECHOCARDIOGRAM N/A 04/11/2013   Procedure: INTRAOPERATIVE TRANSESOPHAGEAL ECHOCARDIOGRAM;  Surgeon: Rexene Alberts, MD;  Location: [place];  Service: Open Heart Surgery;  Laterality: N/A;  . INTRAOPERATIVE TRANSESOPHAGEAL ECHOCARDIOGRAM N/A 04/12/2013   Procedure: INTRAOPERATIVE TRANSESOPHAGEAL ECHOCARDIOGRAM;  Surgeon: Rexene Alberts, MD;  Location: [place];  Service: Open Heart Surgery;  Laterality: N/A;  . KIDNEY TRANSPLANT  03/16/11  . LEFT AND RIGHT HEART CATHETERIZATION WITH CORONARY ANGIOGRAM N/A 04/06/2013   Procedure: LEFT AND RIGHT HEART CATHETERIZATION WITH CORONARY ANGIOGRAM;  Surgeon: Blane Ohara, MD;  Location: [facility] CATH LAB;  Service: Cardiovascular;  Laterality: N/A;  . MAZE N/A 04/12/2013   Procedure: MAZE;  Surgeon: Rexene Alberts, MD;  Location: [place];  Service: Open Heart Surgery;  Laterality: N/A;  . NASAL RECONSTRUCTION WITH SEPTAL REPAIR     "took it out" (04/05/2013)  . NEPHRECTOMY Left 2010   no CA on bx  . TONSILLECTOMY    . TOTAL ABDOMINAL HYSTERECTOMY    . TUBAL LIGATION       Current Outpatient Prescriptions  Medication Sig Dispense Refill  . Alum & Mag Hydroxide-Simeth (MAGIC MOUTHWASH) SOLN Take 5 mLs by mouth 4 (four) times daily. Swish, hold and swallow 500 mL 1  . amLODipine (NORVASC) 10 MG tablet Take 1  tablet (10 mg total) by mouth daily. 90 tablet 3  . dapsone 25 MG tablet Take 25 mg by mouth daily.     . diphenhydrAMINE (BENADRYL) 50 MG capsule Take 1 capsule (50 mg total) by mouth every 6 (six) hours as needed for itching. 90 capsule 3  . furosemide (LASIX) 20 MG tablet Take 60 mg by mouth daily.  10  . gabapentin (NEURONTIN) 100 MG capsule Take 1 capsule (100 mg total) by mouth 3 (three) times daily. (Patient taking differently: Take 100 mg by mouth 2 (two) times daily. ) 270 capsule 1  . HUMALOG KWIKPEN 100 UNIT/ML KiwkPen IF SUGAR:140-200 USE 8 UNITS,201-230 10 UNITS,231-260 12 UNITS,261-300 14 UNITS,>301 16 UNITS (Patient taking differently: Inject into the skin two to three times a day before a meal as needed per sliding scale: BGL 140-200 = 8 units; 201-230 = 10 units; 231-260 =  12 units; 261-300 = 14 units; >301 = 160 units) 9 pen 11  . Insulin Pen Needle 31G X 5 MM MISC Use to administer insulin four times a day Dx E11.9 130 each 5  . KLOR-CON M20 20 MEQ tablet TAKE 2 TABLETS (40 MEQ TOTAL) BY MOUTH DAILY. 60 tablet 5  . levothyroxine (SYNTHROID, LEVOTHROID) 25 MCG tablet TAKE 2 TABLETS (50 MCG TOTAL) BY MOUTH DAILY. 60 tablet 11  . metoprolol tartrate (LOPRESSOR) 25 MG tablet TAKE 1 TABLET BY MOUTH TWICE A DAY *NEED APPOINTMENT FOR REFILLS* 180 tablet 1  . Multiple Vitamins-Minerals (CENTRUM SILVER 50+WOMEN) TABS Take 1 tablet by mouth daily with breakfast.    . mycophenolate (MYFORTIC) 180 MG EC tablet Take 360 mg by mouth 2 (two) times daily.     [name] DELICA LANCETS 65K MISC Use to check blood sugars twice a day DX E11.9 100 each 5  . ONETOUCH VERIO test strip USE AS INSTRUCTED 4 TIMES A DAY (BEFORE MEALS) & AT BEDTIME (ICD 10-E11.9) 150 each 3  . oxyCODONE-acetaminophen (ROXICET) 5-325 MG tablet Take 0.5-1 tablets by mouth every 6 (six) hours as needed for severe pain. 100 tablet 0  . tacrolimus (PROGRAF) 1 MG capsule Take 4 mg by mouth 2 (two) times daily.     Marland Kitchen warfarin  (COUMADIN) 5 MG tablet TAKE 1 TO 1.5 TABLETS BY MOUTH DAILY AS DIRECTED BY COUMADIN CLINIC 120 tablet 0   No current facility-administered medications for this visit.     Allergies:   Ibuprofen; Sulfamethoxazole-trimethoprim; Sulfonamide derivatives; Tape; Tramadol; Hydrocil [psyllium]; Bactrim; and Red dye    Social History:  The patient  reports that she quit smoking about 15 years ago. Her smoking use included Cigarettes. She has a 30.00 pack-year smoking history. She has never used smokeless tobacco. She reports that she does not drink alcohol or use drugs.   Family History:  The patient's family history includes Diabetes in her maternal grandmother, other, and son; Hypertension in her mother; Stroke in her father.    ROS:  Please see the history of present illness.   Otherwise, review of systems are positive for none.   All other systems are reviewed and negative.    PHYSICAL EXAM: VS:  BP (!) 149/65 (BP Location: Right Arm)   Pulse (!) 54   Ht 5\' 1"  (1.549 m)   Wt 172 lb 6.4 oz (78.2 kg)   BMI 32.57 kg/m  , BMI Body mass index is 32.57 kg/m. GEN: Well nourished, well developed, in no acute distress  HEENT: normal  Neck: no JVD, carotid bruits, or masses Cardiac: RRR; no  rubs, or gallops,no edema . 3/6 crescendo decrescendo systolic murmur in the aortic area which is mid peaking. Respiratory:  clear to auscultation bilaterally, normal work of breathing GI: soft, nontender, nondistended, + BS MS: no deformity or atrophy  Skin: warm and dry, no rash Neuro:  Strength and sensation are intact Psych: euthymic mood, full affect Vascular: Femoral pulses +1 bilaterally. Distal pulses are not palpable.  EKG:  EKG is ordered today. The ekg ordered today demonstrates : Sinus bradycardia with incomplete left bundle branch block. Lateral T wave changes suggestive of ischemia.   Recent Labs: 12/27/2016: B Natriuretic Peptide 1,653.2 01/07/2017: ALT 17; BUN 34; Creatinine, Ser 1.54;  Hemoglobin 12.4; Platelets 249.0; Potassium 4.7; Sodium 141; TSH 2.08    Lipid Panel    Component Value Date/Time   CHOL 174 11/18/2016 1354   TRIG 131.0 11/18/2016 1354  TRIG 121 06/13/2006 1525   HDL 75.10 11/18/2016 1354   CHOLHDL 2 11/18/2016 1354   VLDL 26.2 11/18/2016 1354   LDLCALC 72 11/18/2016 1354   LDLDIRECT 89.3 06/13/2006 1525      Wt Readings from Last 3 Encounters:  04/12/17 172 lb 6.4 oz (78.2 kg)  03/23/17 168 lb 3.2 oz (76.3 kg)  03/22/17 165 lb (74.8 kg)       PAD Screen 04/12/2017  Previous PAD dx? Yes  Previous surgical procedure? No  Pain with walking? Yes  Subsides with rest? No  Feet/toe relief with dangling? Yes  Painful, non-healing ulcers? No  Extremities discolored? No      ASSESSMENT AND PLAN:  1.  Peripheral arterial disease with severe right calf claudication and moderate left calf claudication: I discussed with her the natural history and management of claudication. Angiography and endovascular intervention should be left as a last resort given underlying chronic kidney disease status post kidney transplant. I discussed with her the importance of regular walking. I'm going to obtain an aortoiliac duplex and lower extremity arterial duplex to see if her disease is amenable to endovascular intervention at a reasonable risk.  2. Status post aortic valve replacement: Stable.  3. Hyperlipidemia: There is a strong indication for treatment with a statin regardless of her lipid profile given coronary and peripheral arterial disease. It is not entirely clear to me why she is not on a statin.  4. Coronary artery disease: No anginal symptoms.   Disposition:   FU with me in 2 months  Signed,  Kathlyn Sacramento, MD  04/12/2017 2:32 PM    [place]

## 2017-04-12 NOTE — Patient Instructions (Addendum)
Medication Instructions: Your physician recommends that you continue on your current medications as directed. Please refer to the Current Medication list given to you today.  If you need a refill on your cardiac medications before your next appointment, please call your pharmacy.   Procedures/Testing: Your physician has requested that you have a lower extremity arterial duplex.  This will be done at [place], [address].  Your physican has recommended that you have an Aorta/Illiac duplex done. This will be done at [place], [address].  Follow-Up: Your physician wants you to follow-up in: 2 months with Dr. Zannie Cove will receive a reminder letter in the mail two months in advance. If you don't receive a letter, please call our office at [phone] to schedule this follow-up appointment.   Thank you for choosing [facility] at [facility]!!

## 2017-04-13 ENCOUNTER — Ambulatory Visit (INDEPENDENT_AMBULATORY_CARE_PROVIDER_SITE_OTHER): Payer: Medicare Other | Admitting: Pharmacist Clinician (PhC)/ Clinical Pharmacy Specialist

## 2017-04-13 DIAGNOSIS — Z940 Kidney transplant status: Secondary | ICD-10-CM | POA: Diagnosis not present

## 2017-04-13 DIAGNOSIS — Z5181 Encounter for therapeutic drug level monitoring: Secondary | ICD-10-CM

## 2017-04-13 DIAGNOSIS — I4891 Unspecified atrial fibrillation: Secondary | ICD-10-CM | POA: Diagnosis not present

## 2017-04-13 LAB — POCT INR: INR: 2.9

## 2017-04-18 ENCOUNTER — Other Ambulatory Visit: Payer: Self-pay | Admitting: Internal Medicine

## 2017-04-20 DIAGNOSIS — Z940 Kidney transplant status: Secondary | ICD-10-CM | POA: Diagnosis not present

## 2017-04-21 ENCOUNTER — Other Ambulatory Visit: Payer: Self-pay | Admitting: Cardiovascular Disease

## 2017-04-21 DIAGNOSIS — I739 Peripheral vascular disease, unspecified: Secondary | ICD-10-CM

## 2017-04-22 ENCOUNTER — Other Ambulatory Visit: Payer: Self-pay

## 2017-04-22 NOTE — Patient Outreach (Signed)
[place] [facility]) Care Management  04/22/2017  Janeice Stegall [place] 23-Aug-1945 [id number]   Telephone call to patient for monthly follow-up.  Her husband stated that she was out of town until next week to attend a wedding.  RN contact will be rescheduled.  Candie Mile, RN, MSN [place] [phone] Fax [phone]

## 2017-04-28 ENCOUNTER — Ambulatory Visit: Payer: Medicare Other

## 2017-05-05 ENCOUNTER — Encounter: Payer: Self-pay | Admitting: *Deleted

## 2017-05-05 ENCOUNTER — Other Ambulatory Visit: Payer: Self-pay | Admitting: *Deleted

## 2017-05-05 ENCOUNTER — Other Ambulatory Visit: Payer: Self-pay | Admitting: Internal Medicine

## 2017-05-05 NOTE — Patient Outreach (Addendum)
[place] [facility]) Care Management  05/05/2017  Gennavieve Huq Sorber [birth year] [id number]   RN Health Coach Monthly Outreach  Referral Date: 01/12/2017 Referral Source: [facility] RN Case Manager/ Reason for Referral: CHF education Insurance: Medicare   Outreach Attempt:  Successful monthly telephone outreach.  Patient states she has recently returned from visiting family out of town for a wedding.  Weight this morning is 169 pounds, which is down from 174 pounds last week.  She feels she knows she was not compliant with her low sodium/diabetic diet while on vacation.  Patient reports having periods of increasing shortness of breath, but it is better now.  Patient states she has heart failure educational booklet and zones packet at home and has reviewed in the past.  She is unable to describe the signs and symptoms for each zone and what to do when experiencing those symptoms.  When asked if she knew when to call her physician for symptoms, patient stated "I would rather just go to the emergency room".  Encouraged patient to contact physician or [facility] for symptoms in the yellow zone.  Patient does not remember receiving the flu shot this fall, and states she does not want it as she "gets sick when taking this shot".  Reports fasting CBG this morning was 179.  States her fasting CBG's have been ranging 180-300's.  She is unsure of her last A1C.  A1C was 7.8 on 03/07/2017.  Discussed her latest A1C results and how this was trending down.  Patient encouraged to discuss with her primary MD her A1C goal and her fasting CBG goals.  Also, encouraged patient to discuss her class of heart failure with her Cardiologist.  Patient reports no falls in the last 3 months and ambulating with a cane to steady her balance. She also reports no change in appetite and no weight loss over the last few months.  Patient continues to care for her 30 1/73 year old great grand son.  Appointments: Has an appointment 05/09/2017 with  her renal doctor.  Has appointments with Primary physician on October 31 and Vascular Radiologist on the same day.  Encouraged patient to see if she could spread the appointment times out so she could make both appointments on 10/31.  Seen her cardiologist last on 03/22/2017 and she is also following with a vascular cardiologist for her peripheral artery disease.  Feels she is visiting a physician about every week.  Plan: RN Health Coach reinforced need to review Heart Failure booklet and zones information. RN Health Coach reinforced when to call the MD versus going to the emergency room. RN Health Coach will send EMMI education on foods low in sodium versus high in sodium. RN Health Coach reviewed food choices with patient and need to review food labels to look at salt and carbohydrate content. RN Health Coach will send "Know Before You Go" handout to patient. RN Health Coach advised patient to contact [place] for any needs or concerns. RN Health Coach advised patient to alert MD for any changes in conditions.  RN Health Coach provided patient with [facility] 24 hr Nurse Line contact info. RN Health Coach will contact patient in the month of November  [name] RN [place] [phone] [email or web address]

## 2017-05-06 ENCOUNTER — Ambulatory Visit (HOSPITAL_COMMUNITY)
Admission: RE | Admit: 2017-05-06 | Discharge: 2017-05-06 | Disposition: A | Discharge status: Home / Self Care | Payer: Medicare Other | Source: Ambulatory Visit | Attending: Cardiovascular Disease | Admitting: Cardiovascular Disease

## 2017-05-06 DIAGNOSIS — I739 Peripheral vascular disease, unspecified: Secondary | ICD-10-CM | POA: Insufficient documentation

## 2017-05-06 DIAGNOSIS — Z940 Kidney transplant status: Secondary | ICD-10-CM | POA: Insufficient documentation

## 2017-05-06 DIAGNOSIS — R9389 Abnormal findings on diagnostic imaging of other specified body structures: Secondary | ICD-10-CM | POA: Diagnosis not present

## 2017-05-09 ENCOUNTER — Ambulatory Visit (INDEPENDENT_AMBULATORY_CARE_PROVIDER_SITE_OTHER): Payer: Medicare Other | Admitting: Pharmacist

## 2017-05-09 DIAGNOSIS — E119 Type 2 diabetes mellitus without complications: Secondary | ICD-10-CM | POA: Diagnosis not present

## 2017-05-09 DIAGNOSIS — R809 Proteinuria, unspecified: Secondary | ICD-10-CM | POA: Diagnosis not present

## 2017-05-09 DIAGNOSIS — Z940 Kidney transplant status: Secondary | ICD-10-CM | POA: Diagnosis not present

## 2017-05-09 DIAGNOSIS — D899 Disorder involving the immune mechanism, unspecified: Secondary | ICD-10-CM | POA: Diagnosis not present

## 2017-05-09 DIAGNOSIS — I4891 Unspecified atrial fibrillation: Secondary | ICD-10-CM

## 2017-05-09 DIAGNOSIS — Z5181 Encounter for therapeutic drug level monitoring: Secondary | ICD-10-CM

## 2017-05-09 DIAGNOSIS — I10 Essential (primary) hypertension: Secondary | ICD-10-CM | POA: Diagnosis not present

## 2017-05-09 LAB — POCT INR: INR: 2.1

## 2017-05-17 ENCOUNTER — Telehealth: Payer: Self-pay | Admitting: Radiology

## 2017-05-17 NOTE — Telephone Encounter (Signed)
Patient cancelled tomorrow's 1 pm appointment with Dr Corrie Mckusick.  States that she is seeing Dr Fletcher Anon for the same thing.  States that she will call back if/when she wants to reschedule with Dr Earleen Newport.  Jaymz Traywick Riki Rusk, RN 05/17/2017 4:32 PM

## 2017-05-18 ENCOUNTER — Ambulatory Visit: Payer: Medicare Other | Admitting: Internal Medicine

## 2017-05-18 ENCOUNTER — Other Ambulatory Visit: Payer: Medicare Other

## 2017-05-19 ENCOUNTER — Ambulatory Visit
Admission: RE | Admit: 2017-05-19 | Discharge: 2017-05-19 | Disposition: A | Discharge status: Home / Self Care | Payer: Medicare Other | Source: Ambulatory Visit | Attending: Internal Medicine | Admitting: Internal Medicine

## 2017-05-19 DIAGNOSIS — Z1231 Encounter for screening mammogram for malignant neoplasm of breast: Secondary | ICD-10-CM

## 2017-05-23 ENCOUNTER — Other Ambulatory Visit: Payer: Self-pay | Admitting: Internal Medicine

## 2017-05-25 ENCOUNTER — Encounter: Payer: Self-pay | Admitting: *Deleted

## 2017-05-25 ENCOUNTER — Other Ambulatory Visit: Payer: Self-pay | Admitting: *Deleted

## 2017-05-25 NOTE — Patient Outreach (Signed)
[facility] [facility]) Care Management  05/25/2017  [name] [birth year] [id number]   RN Health Coach Monthly Outreach  Referral Date: 01/12/2017 Referral Source: [facility] RN Case Manager/ Reason for Referral: CHF education Insurance: Medicare   Outreach Attempt:  Successful monthly telephone outreach to patient.  HIPAA verified.  Patient states she is reading education material sent on CHF and diabetes.  Still needs to review the CHF zone education material.  Patient states she is consumed with caring for her great grandson, having difficulty sleeping and getting task done.  Encouraged patient to seek support from other family members.  Patient reports she continues to weigh daily, weight today is 169.6 pounds.  Verbalizes that her goal is to stay below 175 pounds.  Reports her blood glucose this morning was 232.  She verbalizes she often forgets to take fasting blood sugar and has been taking it immediately after she eats.  Encouraged patient to take blood sugar either before she ate or 2-3 hours after eating a meal; instructed prior to eating should be the goal to maintain compliance with sliding scale ordered.  Patient reports fasting blood glucose range has been 180-200's.  Asked if she has spoken with primary provider concerning high fasting numbers, patient states she will discuss at next scheduled appointment.  Appointments: Patient cancelled scheduled primary appointment on 10/31.  She has rescheduled appointment for November 14.  Patient encouraged to keep this appointment.  Currently continues to state she is refusing flu vaccine.  Plan: Patient will continue to review Living better with heart failure and Living well with diabetes education booklets. Patient will continue to monitor and limit salt and carbohydrate intake. RN Health Coach will make monthly telephone outreach to patient in the month of December.   Bruce [phone] [email or web address]

## 2017-05-31 ENCOUNTER — Encounter (HOSPITAL_COMMUNITY): Payer: Self-pay | Admitting: Emergency Medicine

## 2017-05-31 ENCOUNTER — Ambulatory Visit (HOSPITAL_COMMUNITY)
Admission: EM | Admit: 2017-05-31 | Discharge: 2017-05-31 | Disposition: A | Discharge status: Home / Self Care | Payer: Medicare Other | Attending: Internal Medicine | Admitting: Internal Medicine

## 2017-05-31 ENCOUNTER — Other Ambulatory Visit: Payer: Self-pay

## 2017-05-31 DIAGNOSIS — S60463A Insect bite (nonvenomous) of left middle finger, initial encounter: Secondary | ICD-10-CM | POA: Diagnosis not present

## 2017-05-31 DIAGNOSIS — W57XXXA Bitten or stung by nonvenomous insect and other nonvenomous arthropods, initial encounter: Secondary | ICD-10-CM | POA: Diagnosis not present

## 2017-05-31 MED ORDER — DOXYCYCLINE HYCLATE 100 MG PO CAPS: 100.0000 mg | ORAL_CAPSULE | Freq: Two times a day (BID) | ORAL | 0 refills | Status: DC

## 2017-05-31 NOTE — ED Triage Notes (Signed)
Pt states she had a very small spider on her left hand on Friday that bit her.  She has swelling in her middle finger, hand and forearm.

## 2017-05-31 NOTE — ED Provider Notes (Signed)
[place]    CSN: [id number] Arrival date & time: 05/31/17  1500     History   Chief Complaint Chief Complaint  Patient presents with  . Insect Bite    HPI [name] is a 70 y.o. female.   Presenting today for spider bite to her left 3rd digit finger occurred 5 days ago.  She felt something crawling on her hand then she looked and saw a spider and then felt a sting. Patient states that it was a baby brown spider. No fever. Reports pain at 6/10. Reports swelling, redness at the site of the bite. She have tried warm compress and neosporin with no relief.   HPI  Past Medical History:  Diagnosis Date  . Anemia, iron deficiency    of chronic disease  . Aortic stenosis    a. Severe AS by echo 11/2012.  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 Asystole ([place])    a. During ENT surgery 2005: developed marked asystole requiring CPR, felt due to vagal reaction (cath nonobst dz).  . Carotid artery disease ([place])    a. Carotid Dopplers performed in August 2013 showed 40-59% left stenosis and 0-39% right; f/u recommended in 2 years.   . Cerebrovascular accident [facility]) 2009   a. LMCA infarct felt embolic 7253, maintained on chronic coumadin.; denies residual on 04/05/2013  . CHF (congestive heart failure) ([place])   . Cholelithiasis   . Chronic cough onset 03/2010   Dr Melvyn Novas  . Chronic Persistent Atrial Fibrillation 12/31/2008   Qualifier: Diagnosis of  By: [name]    . Coronary artery disease 05/2002   a. Ant MI 2003 s/p PTCA/stent to RCA.   . Diverticulosis of colon   . Esophagitis, reflux   . ESRD (end stage renal disease) ([place])    a. Mass on L kidney per pt s/p nephrectomy - pt states not cancer - [facility] notes indicate ESRD due to HTN/DM - was previously on HD. b. Kidney transplant 02/2011.  Marland Kitchen GERD (gastroesophageal reflux disease)   . Gout   . Helicobacter pylori (H. pylori) infection    hx of  . Hemorrhoids   . Hx of colonic polyps    adenomatous  . Hyperlipidemia   . Hypertension   . Lung nodule seen on imaging study 04/07/2013   1.0 cm ground glass opacity RUL  . Myocardial infarction ([place]) 2003  . Pericardial effusion    a. Small by echo 11/2011.  Marland Kitchen Pruritic dermatitis    treated with steriods/UV light  . S/P aortic valve replacement with bioprosthetic valve and maze procedure 04/12/2013   51mm [facility] Ease bovine pericardial tissue valve   . S/P Maze operation for atrial fibrillation 04/12/2013   Complete bilateral atrial lesion set using bipolar radiofrequency and cryothermy ablation with clipping of LA appendage  . Sleep apnea    Pt says testing was positive, intolerant of CPAP.  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  . Type II diabetes mellitus ([place])   . Unspecified hearing loss     Patient Active Problem List   Diagnosis Date Noted  . Diabetes mellitus due to underlying condition with hyperglycemia, without long-term current use of insulin ([place])   . Acute respiratory failure with hypoxia ([place]) 12/27/2016  . Acute on chronic diastolic CHF (congestive heart failure) ([place])  12/27/2016  . Left arm swelling 12/27/2016  . CKD (chronic kidney disease), stage III ([place]) 12/27/2016  . Hypertensive urgency 12/27/2016  . Pain of left breast 12/27/2016  . CHF exacerbation ([place]) 12/27/2016  . Arm muscle atrophy 11/16/2016  . Insomnia 02/25/2016  . Renal cyst 11/21/2015  . Renal mass, right 11/10/2015  . Skin abscess 08/13/2015  . Shoulder pain, right 08/13/2015  . Earache 11/11/2014  . Weight gain 08/12/2014  . Multinodular goiter 05/01/2014  . Foot pain, bilateral 04/12/2014  . Encounter for therapeutic drug monitoring 08/14/2013  . S/P AVR (aortic valve replacement) 05/14/2013  . S/P aortic valve replacement with bioprosthetic valve and maze  procedure 04/12/2013  . S/P Maze operation for atrial fibrillation 04/12/2013  . Nodule of right lung 04/07/2013  . Lung nodule seen on imaging study 04/07/2013  . Pain in limb 05/22/2012  . Cramps, muscle, general 02/29/2012  . Dermatitis 01/26/2012  . Knee pain 01/26/2012  . Wrist pain 01/26/2012  . Long term (current) use of anticoagulants 08/17/2011  . S/P kidney transplant 03/16/2011  . Knee pain, right 02/23/2011  . Low back pain 02/23/2011  . Hypersalivation 11/24/2010  . AORTIC STENOSIS 08/20/2010  . DISTURBANCE OF SALIVARY SECRETION 07/28/2010  . COAGULOPATHY 05/27/2010  . NAUSEA 04/14/2010  . DYSPHAGIA, PHARYNGOESOPHAGEAL PHASE 03/10/2010  . End stage renal disease ([place]) 12/09/2009  . SMOKER 10/07/2009  . ALOPECIA 10/07/2009  . SKIN RASH 10/07/2009  . CAROTID STENOSIS 03/04/2009  . PRURITUS 03/04/2009  . STREP INF CCE & UNS SITE GROUP D [ENTEROCOCCUS] 12/31/2008  . ANEMIA 12/31/2008  . Chronic Persistent Atrial Fibrillation 12/31/2008  . ECCHYMOSES 10/29/2008  . Cough 08/27/2008  . ABDOMINAL PAIN 08/27/2008  . NEOPLASM, MALIGNANT, KIDNEY 07/02/2008  . APHASIA DUE TO CEREBROVASCULAR DISEASE 07/02/2008  . Chronic fatigue 05/21/2008  . HYPERLIPIDEMIA 09/27/2007  . PERICARDIAL EFFUSION 09/27/2007  . CEREBROVASCULAR ACCIDENT 09/27/2007  . CONSTIPATION 09/27/2007  . SLEEP APNEA 09/27/2007  . CHOLELITHIASIS 06/09/2007  . ESOPHAGITIS, REFLUX 06/08/2007  . DUODENITIS, WITHOUT HEMORRHAGE 06/08/2007  . HELICOBACTER PYLORI INFECTION, HX OF 06/08/2007  . DM type 2 causing renal disease ([place]) 05/23/2007  . Gout 05/23/2007  . 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 MYOCARDIAL INFARCTION, HX OF 05/23/2007  . Coronary atherosclerosis 05/23/2007  . Congestive heart failure ([place]) 05/23/2007  . GERD 05/23/2007  . COLONIC POLYPS, HX OF 05/23/2007  . DIVERTICULOSIS, COLON 07/01/2005    Past Surgical History:  Procedure Laterality Date  . ARTERIOVENOUS GRAFT  PLACEMENT Left   . ARTERIOVENOUS GRAFT PLACEMENT Left    "I've had 2 on my left; had one removed" (04/05/2013)   . AV FISTULA PLACEMENT Right   . AV FISTULA REPAIR Right    "took it out" ((/18/2014)  . CHOLECYSTECTOMY  2009   with hernia removal  . CORONARY ANGIOPLASTY WITH STENT PLACEMENT Right    coronary artery  . INSERTION OF DIALYSIS CATHETER Bilateral    "over the years; took them both out" (04/05/2013)  . KIDNEY TRANSPLANT  03/16/11  . NASAL RECONSTRUCTION WITH SEPTAL REPAIR     "took it out" (04/05/2013)  . NEPHRECTOMY Left 2010   no CA on bx  . TONSILLECTOMY    . TOTAL ABDOMINAL HYSTERECTOMY    . TUBAL LIGATION      OB History    No data available       Home Medications    Prior to Admission medications   Medication Sig Start Date End Date Taking? Authorizing Provider  amLODipine (NORVASC) 10 MG tablet Take 1 tablet (10 mg total) by mouth daily. 03/23/17  Yes Lelon Perla, MD  dapsone 25 MG tablet Take 25 mg by mouth daily.    Yes [provider]  diphenhydrAMINE (BENADRYL) 50 MG capsule Take 1 capsule (50 mg total) by mouth every 6 (six) hours as needed for itching. 08/31/11  Yes Plotnikov, [name], MD  furosemide (LASIX) 20 MG tablet Take 60 mg by mouth daily. 04/11/17  Yes [provider]  gabapentin (NEURONTIN) 100 MG capsule TAKE 1 CAPSULE (100 MG TOTAL) BY MOUTH 3 (THREE) TIMES DAILY. 04/19/17  Yes Plotnikov, [name], MD  HUMALOG KWIKPEN 100 UNIT/ML KiwkPen IF SUGAR:140-200 USE 8 UNITS,201-230 10 UNITS,231-260 12 UNITS,261-300 14 UNITS,>301 16 UNITS Patient taking differently: Inject into the skin two to three times a day before a meal as needed per sliding scale: BGL 140-200 = 8 units; 201-230 = 10 units; 231-260 = 12 units; 261-300 = 14 units; >301 = 160 units 07/04/16  Yes Plotnikov, [name], MD  Insulin Pen Needle 31G X 5 MM MISC Use to administer insulin four times a day Dx E11.9 07/28/16  Yes Elayne Snare, MD  KLOR-CON M20 20 MEQ tablet  TAKE 2 TABLETS (40 MEQ TOTAL) BY MOUTH DAILY. 02/24/17  Yes Plotnikov, [name], MD  levothyroxine (SYNTHROID, LEVOTHROID) 25 MCG tablet TAKE 2 TABLETS (50 MCG TOTAL) BY MOUTH DAILY. 05/06/17  Yes Plotnikov, [name], MD  metoprolol tartrate (LOPRESSOR) 25 MG tablet TAKE 1 TABLET BY MOUTH TWICE A DAY *NEED APPOINTMENT FOR REFILLS* 04/04/17  Yes Sherran Needs, NP  Multiple Vitamins-Minerals (CENTRUM SILVER 50+WOMEN) TABS Take 1 tablet by mouth daily with breakfast.   Yes [provider]  mycophenolate (MYFORTIC) 180 MG EC tablet Take 360 mg by mouth 2 (two) times daily.    Yes [provider]  Jonetta Speak LANCETS 82N MISC Use to check blood sugars twice a day DX E11.9 07/23/16  Yes Plotnikov, [name], MD  ONETOUCH VERIO test strip USE AS INSTRUCTED 4 TIMES A DAY (BEFORE MEALS) & AT BEDTIME (ICD 10-E11.9) 05/25/17  Yes Plotnikov, [name], MD  oxyCODONE-acetaminophen (ROXICET) 5-325 MG tablet Take 0.5-1 tablets by mouth every 6 (six) hours as needed for severe pain. 02/16/17  Yes Plotnikov, [name], MD  tacrolimus (PROGRAF) 1 MG capsule Take 4 mg by mouth 2 (two) times daily.    Yes [provider]  warfarin (COUMADIN) 5 MG tablet TAKE 1 TO 1.5 TABLETS BY MOUTH DAILY AS DIRECTED BY COUMADIN CLINIC 03/10/17  Yes Lelon Perla, MD  Alum & Mag Hydroxide-Simeth (MAGIC MOUTHWASH) SOLN Take 5 mLs by mouth 4 (four) times daily. Swish, hold and swallow Patient not taking: Reported on 05/05/2017 10/09/13   Plotnikov, [name], MD  doxycycline (VIBRAMYCIN) 100 MG capsule Take 1 capsule (100 mg total) 2 (two) times daily for 7 days by mouth. 05/31/17 06/07/17  Barry Dienes, NP    Family History Family History  Problem Relation Age of Onset  . Stroke Father   . Hypertension Mother   . Diabetes Other   . Diabetes Maternal Grandmother   . Diabetes Son   . Breast cancer Neg Hx     Social History Social History   Tobacco Use  . Smoking status: Former Smoker     Packs/day: 1.00    Years: 30.00    Pack years: 30.00    Types: Cigarettes    Last attempt to quit: 07/19/2001  Years since quitting: 15.8  . Smokeless tobacco: Never Used  Substance Use Topics  . Alcohol use: No  . Drug use: No     Allergies   Ibuprofen; Sulfamethoxazole-trimethoprim; Sulfonamide derivatives; Tape; Tramadol; Hydrocil [psyllium]; Bactrim; and Red dye   Review of Systems Review of Systems  Constitutional:       See HPI     Physical Exam Triage Vital Signs ED Triage Vitals  Enc Vitals Group     BP 05/31/17 1514 (!) 147/65     Pulse Rate 05/31/17 1514 97     Resp --      Temp 05/31/17 1514 98.5 F (36.9 C)     Temp Source 05/31/17 1514 Oral     SpO2 05/31/17 1514 96 %     Weight --      Height --      Head Circumference --      Peak Flow --      Pain Score 05/31/17 1513 8     Pain Loc --      Pain Edu? --      Excl. in [place]? --    No data found.  Updated Vital Signs BP (!) 147/65 (BP Location: Right Arm)   Pulse 97   Temp 98.5 F (36.9 C) (Oral)   SpO2 96%   Visual Acuity Right Eye Distance:   Left Eye Distance:   Bilateral Distance:    Right Eye Near:   Left Eye Near:    Bilateral Near:     Physical Exam  Constitutional: She is oriented to person, place, and time. She appears well-developed and well-nourished.  Cardiovascular: Normal rate.  Pulmonary/Chest: Effort normal.  Neurological: She is alert and oriented to person, place, and time.  Skin:  See picture below  Nursing note and vitals reviewed.      UC Treatments / Results  Labs (all labs ordered are listed, but only abnormal results are displayed) Labs Reviewed - No data to display  EKG  EKG Interpretation None       Radiology No results found.  Procedures Procedures (including critical care time)  Medications Ordered in UC Medications - No data to display   Initial Impression / Assessment and Plan / UC Course  I have reviewed the triage vital signs  and the nursing notes.  Pertinent labs & imaging results that were available during my care of the patient were reviewed by me and considered in my medical decision making (see chart for details).   Final Clinical Impressions(s) / UC Diagnoses   Final diagnoses:  Insect bite, initial encounter   Wound care discussed Keep wound covered with band aid May continue neosporin.  Start Doxycycline BID X 7 days.  F/u with PCP for no improvement.  ED Discharge Orders        Ordered    doxycycline (VIBRAMYCIN) 100 MG capsule  2 times daily     05/31/17 1529       Controlled Substance Prescriptions [place] Controlled Substance Registry consulted? Not Applicable   Barry Dienes, NP 05/31/17 1534

## 2017-06-01 ENCOUNTER — Encounter: Payer: Self-pay | Admitting: Internal Medicine

## 2017-06-01 ENCOUNTER — Ambulatory Visit (INDEPENDENT_AMBULATORY_CARE_PROVIDER_SITE_OTHER): Payer: Medicare Other | Admitting: Internal Medicine

## 2017-06-01 DIAGNOSIS — Z940 Kidney transplant status: Secondary | ICD-10-CM | POA: Diagnosis not present

## 2017-06-01 DIAGNOSIS — E1121 Type 2 diabetes mellitus with diabetic nephropathy: Secondary | ICD-10-CM

## 2017-06-01 DIAGNOSIS — Z794 Long term (current) use of insulin: Secondary | ICD-10-CM | POA: Diagnosis not present

## 2017-06-01 DIAGNOSIS — L02818 Cutaneous abscess of other sites: Secondary | ICD-10-CM

## 2017-06-01 DIAGNOSIS — I2510 Atherosclerotic heart disease of native coronary artery without angina pectoris: Secondary | ICD-10-CM | POA: Diagnosis not present

## 2017-06-01 DIAGNOSIS — I151 Hypertension secondary to other renal disorders: Secondary | ICD-10-CM

## 2017-06-01 MED ORDER — AMOXICILLIN 500 MG PO CAPS: ORAL_CAPSULE | ORAL | 1 refills | Status: DC

## 2017-06-01 MED ORDER — CEPHALEXIN 500 MG PO CAPS: 500.0000 mg | ORAL_CAPSULE | Freq: Four times a day (QID) | ORAL | 0 refills | Status: DC

## 2017-06-01 NOTE — Assessment & Plan Note (Signed)
Per Dr Joelyn Oms

## 2017-06-01 NOTE — Assessment & Plan Note (Signed)
Amlodipine, Pravastatin

## 2017-06-01 NOTE — Assessment & Plan Note (Signed)
Pt had a L ring finger spider bite - went to UC last night and given po Doxy

## 2017-06-01 NOTE — Progress Notes (Signed)
Subjective:  [name], female    DOB: Jul 24, 1945  Age: 71 y.o. MRN: [id number]  CC: No chief complaint on file.   HPI [name] presents for HTN, DM, CRF and CHF f/u. C/o stress w/her gr-grandchild who is she is raising. Pt had a L ring finger spider bite - went to UC last night and given po Doxy  Outpatient Medications Prior to Visit  Medication Sig Dispense Refill  . Alum & Mag Hydroxide-Simeth (MAGIC MOUTHWASH) SOLN Take 5 mLs by mouth 4 (four) times daily. Swish, hold and swallow 500 mL 1  . amLODipine (NORVASC) 10 MG tablet Take 1 tablet (10 mg total) by mouth daily. 90 tablet 3  . dapsone 25 MG tablet Take 25 mg by mouth daily.     . diphenhydrAMINE (BENADRYL) 50 MG capsule Take 1 capsule (50 mg total) by mouth every 6 (six) hours as needed for itching. 90 capsule 3  . doxycycline (VIBRAMYCIN) 100 MG capsule Take 1 capsule (100 mg total) 2 (two) times daily for 7 days by mouth. 14 capsule 0  . furosemide (LASIX) 20 MG tablet Take 60 mg by mouth daily.  10  . gabapentin (NEURONTIN) 100 MG capsule TAKE 1 CAPSULE (100 MG TOTAL) BY MOUTH 3 (THREE) TIMES DAILY. 270 capsule 1  . HUMALOG KWIKPEN 100 UNIT/ML KiwkPen IF SUGAR:140-200 USE 8 UNITS,201-230 10 UNITS,231-260 12 UNITS,261-300 14 UNITS,>301 16 UNITS (Patient taking differently: Inject into the skin two to three times a day before a meal as needed per sliding scale: BGL 140-200 = 8 units; 201-230 = 10 units; 231-260 = 12 units; 261-300 = 14 units; >301 = 160 units) 9 pen 11  . Insulin Pen Needle 31G X 5 MM MISC Use to administer insulin four times a day Dx E11.9 130 each 5  . KLOR-CON M20 20 MEQ tablet TAKE 2 TABLETS (40 MEQ TOTAL) BY MOUTH DAILY. 60 tablet 5  . levothyroxine (SYNTHROID, LEVOTHROID) 25 MCG tablet TAKE 2 TABLETS (50 MCG TOTAL) BY MOUTH DAILY. 60 tablet 2  . metoprolol tartrate (LOPRESSOR) 25 MG tablet TAKE 1 TABLET BY MOUTH TWICE A DAY *NEED APPOINTMENT FOR REFILLS* 180 tablet 1  . Multiple  Vitamins-Minerals (CENTRUM SILVER 50+WOMEN) TABS Take 1 tablet by mouth daily with breakfast.    . mycophenolate (MYFORTIC) 180 MG EC tablet Take 360 mg by mouth 2 (two) times daily.     [name] DELICA LANCETS 25E MISC Use to check blood sugars twice a day DX E11.9 100 each 5  . ONETOUCH VERIO test strip USE AS INSTRUCTED 4 TIMES A DAY (BEFORE MEALS) & AT BEDTIME (ICD 10-E11.9) 150 each 3  . oxyCODONE-acetaminophen (ROXICET) 5-325 MG tablet Take 0.5-1 tablets by mouth every 6 (six) hours as needed for severe pain. 100 tablet 0  . tacrolimus (PROGRAF) 1 MG capsule Take 4 mg by mouth 2 (two) times daily.     Marland Kitchen warfarin (COUMADIN) 5 MG tablet TAKE 1 TO 1.5 TABLETS BY MOUTH DAILY AS DIRECTED BY COUMADIN CLINIC 120 tablet 0   No facility-administered medications prior to visit.     ROS Review of Systems  Constitutional: Positive for fatigue. Negative for activity change, appetite change, chills and unexpected weight change.  HENT: Negative for congestion, mouth sores and sinus pressure.   Eyes: Negative for visual disturbance.  Respiratory: Negative for cough and chest tightness.   Gastrointestinal: Negative for abdominal pain and nausea.  Genitourinary: Negative for difficulty urinating, frequency and vaginal pain.  Musculoskeletal: Negative for back pain and gait problem.  Skin: Negative for pallor and rash.  Neurological: Negative for dizziness, tremors, weakness, numbness and headaches.  Psychiatric/Behavioral: Negative for confusion, sleep disturbance and suicidal ideas. The patient is nervous/anxious.     Objective:  BP 120/60 (BP Location: Right Arm, Patient Position: Sitting, Cuff Size: Normal)   Pulse (!) 55   Temp 98.3 F (36.8 C) (Oral)   Ht 5\' 1"  (1.549 m)   Wt 170 lb (77.1 kg)   SpO2 91%   BMI 32.12 kg/m   BP Readings from Last 3 Encounters:  06/01/17 120/60  05/31/17 (!) 147/65  04/12/17 (!) 149/65    Wt Readings from Last 3 Encounters:  06/01/17 170 lb (77.1  kg)  04/12/17 172 lb 6.4 oz (78.2 kg)  03/23/17 168 lb 3.2 oz (76.3 kg)    Physical Exam  Constitutional: She appears well-developed. No distress.  HENT:  Head: Normocephalic.  Right Ear: External ear normal.  Left Ear: External ear normal.  Nose: Nose normal.  Mouth/Throat: Oropharynx is clear and moist.  Eyes: Conjunctivae are normal. Pupils are equal, round, and reactive to light. Right eye exhibits no discharge. Left eye exhibits no discharge.  Neck: Normal range of motion. Neck supple. No JVD present. No tracheal deviation present. No thyromegaly present.  Cardiovascular: Normal rate, regular rhythm and normal heart sounds.  Pulmonary/Chest: No stridor. No respiratory distress. She has no wheezes.  Abdominal: Soft. Bowel sounds are normal. She exhibits no distension and no mass. There is no tenderness. There is no rebound and no guarding.  Musculoskeletal: She exhibits tenderness. She exhibits no edema.  Lymphadenopathy:    She has no cervical adenopathy.  Neurological: She displays normal reflexes. No cranial nerve deficit. She exhibits normal muscle tone. Coordination abnormal.  Skin: No rash noted. No erythema.  Psychiatric: She has a normal mood and affect. Her behavior is normal. Judgment and thought content normal.  [place]  Lab Results  Component Value Date   WBC 8.4 01/07/2017   HGB 12.4 01/07/2017   HCT 39.9 01/07/2017   PLT 249.0 01/07/2017   GLUCOSE 128 (H) 01/07/2017   CHOL 174 11/18/2016   TRIG 131.0 11/18/2016   HDL 75.10 11/18/2016   LDLDIRECT 89.3 06/13/2006   LDLCALC 72 11/18/2016   ALT 17 01/07/2017   AST 23 01/07/2017   NA 141 01/07/2017   K 4.7 01/07/2017   CL 104 01/07/2017   CREATININE 1.54 (H) 01/07/2017   BUN 34 (H) 01/07/2017   CO2 32 01/07/2017   TSH 2.08 01/07/2017   INR 2.1 05/09/2017   HGBA1C 8.4 (H) 11/18/2016   MICROALBUR 317.3 (H) 07/02/2016    No results found.  Assessment & Plan:   There are no diagnoses linked to this  encounter. I am having [name] maintain her mycophenolate, tacrolimus, dapsone, diphenhydrAMINE, magic mouthwash, HUMALOG KWIKPEN, ONETOUCH DELICA LANCETS 03T, Insulin Pen Needle, CENTRUM SILVER 50+WOMEN, oxyCODONE-acetaminophen, KLOR-CON M20, warfarin, amLODipine, metoprolol tartrate, furosemide, gabapentin, levothyroxine, ONETOUCH VERIO, and doxycycline.  No orders of the defined types were placed in this encounter.    Follow-up: No Follow-up on file.  Walker Kehr, MD

## 2017-06-01 NOTE — Assessment & Plan Note (Signed)
Humalog

## 2017-06-06 ENCOUNTER — Other Ambulatory Visit: Payer: Self-pay | Admitting: *Deleted

## 2017-06-06 NOTE — Patient Outreach (Signed)
[facility]) Care Management  06/06/2017  [name] 1946/03/08 [id number]  RN Health Coach Monthly Outreach  Referral Date:01/12/2017 Referral Source:[facility] RN Case Manager/ Reason for Referral:CHF education Insurance:Medicare  Outreach Attempt:  Unsuccessful telephone outreach to patient after Urgent Care visit for insect bite.  No answer.  HIPAA compliant voice message left.  Plan:  RN Health Coach to make another outreach attempt within 10 business days.  [place] [phone] [name]

## 2017-06-07 ENCOUNTER — Other Ambulatory Visit: Payer: Self-pay | Admitting: *Deleted

## 2017-06-07 NOTE — Patient Outreach (Signed)
[facility]) Care Management  06/07/2017  [name] [birth year] [id number]   RN Health Coach Urgent Care Follow Up  Referral Date:01/12/2017 Referral Source:[facility] RN Case Manager/ Reason for Referral:CHF education Insurance:Medicare  Outreach Attempt:  Successful telephone outreach to patient.  HIPAA verified.  Patient had an Urgent  Care visit on 05/31/2017 related to spider bite on her left 3rd finger.  Given oral antibiotics at this visit, she states she was not able to tolerate the prescribed antibiotics due to nausea and vomiting.  Patient had scheduled primary care appointment on 06/01/2017 and Dr. Alain Marion prescribed a different antibiotic.  Patient stating she is currently still taking the 2nd prescription and swelling in finger is going down.  Encouraged patient to notify primary care provider if spider bite/finger gets worse or if she has any questions or concerns.  Plan:  RN Health Coach provided patient with [facility] 24hr Nurse Line contact info. RN Health Coach will make monthly telephone outreach to patient in the month of December.  [place] Coach [phone] [email or web address]

## 2017-06-14 ENCOUNTER — Encounter: Payer: Self-pay | Admitting: Cardiovascular Disease

## 2017-06-14 ENCOUNTER — Ambulatory Visit (INDEPENDENT_AMBULATORY_CARE_PROVIDER_SITE_OTHER): Payer: Medicare Other | Admitting: Cardiovascular Disease

## 2017-06-14 ENCOUNTER — Ambulatory Visit (INDEPENDENT_AMBULATORY_CARE_PROVIDER_SITE_OTHER): Payer: Medicare Other | Admitting: Pharmacist

## 2017-06-14 VITALS — BP 130/52 | HR 74 | Ht 61.0 in | Wt 170.4 lb

## 2017-06-14 DIAGNOSIS — I4891 Unspecified atrial fibrillation: Secondary | ICD-10-CM

## 2017-06-14 DIAGNOSIS — Z5181 Encounter for therapeutic drug level monitoring: Secondary | ICD-10-CM

## 2017-06-14 DIAGNOSIS — Z952 Presence of prosthetic heart valve: Secondary | ICD-10-CM

## 2017-06-14 DIAGNOSIS — E785 Hyperlipidemia, unspecified: Secondary | ICD-10-CM

## 2017-06-14 DIAGNOSIS — I2510 Atherosclerotic heart disease of native coronary artery without angina pectoris: Secondary | ICD-10-CM | POA: Diagnosis not present

## 2017-06-14 DIAGNOSIS — I739 Peripheral vascular disease, unspecified: Secondary | ICD-10-CM | POA: Diagnosis not present

## 2017-06-14 LAB — POCT INR: INR: 2.4

## 2017-06-14 NOTE — Progress Notes (Signed)
Cardiology Office Note   Date:  06/14/2017   ID:  [name], DOB 11/28/1945, MRN [id number]  PCP:  [name], MD  Cardiologist:  Dr. [name]  No chief complaint on file.     History of Present Illness: [name] is a 71 y.o. female who is here today for follow-up visit regarding peripheral arterial disease. The patient has extensive cardiac and medical problems. She has known history of aortic valve stenosis status post bioprosthetic aortic valve replacement, atrial fibrillation status post maze procedure, previous embolic stroke , coronary artery disease, chronic kidney disease status post kidney transplant in 2012 on the left side, hypertension and previous tobacco use. She was seen recently for bilateral leg claudication worse on the right side. She had lower extremity arterial Doppler in March 2018 which showed an ABI of 0.47 on the right and 0.71 on the left. Duplex showed no significant aortoiliac disease.  Repeat ABI was 0.65 bilaterally.  There was evidence of significant SFA disease bilaterally with possible occlusion. She still complains of leg claudication much worse on the right side than the left side.  This happens after walking about 100 feet.  She does use a cane and walks slowly overall.  She is also limited by shortness of breath in addition to claudication.  No rest pain.   Past Medical History:  Diagnosis Date  . Anemia, iron deficiency    of chronic disease  . Aortic stenosis    a. Severe AS by echo 11/2012.  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 Asystole ([place])    a. During ENT surgery 2005: developed marked asystole requiring CPR, felt due to vagal reaction (cath nonobst dz).  . Carotid artery disease ([place])    a. Carotid Dopplers performed in August 2013 showed 40-59% left stenosis and 0-39% right; f/u recommended in 2 years.   . Cerebrovascular accident [facility]) 2009   a. LMCA infarct felt embolic 5784, maintained  on chronic coumadin.; denies residual on 04/05/2013  . CHF (congestive heart failure) ([place])   . Cholelithiasis   . Chronic cough onset 03/2010   Dr Melvyn Novas  . Chronic Persistent Atrial Fibrillation 12/31/2008   Qualifier: Diagnosis of  By: [name]    . Coronary artery disease 05/2002   a. Ant MI 2003 s/p PTCA/stent to RCA.   . Diverticulosis of colon   . Esophagitis, reflux   . ESRD (end stage renal disease) ([place])    a. Mass on L kidney per pt s/p nephrectomy - pt states not cancer - [facility] notes indicate ESRD due to HTN/DM - was previously on HD. b. Kidney transplant 02/2011.  Marland Kitchen GERD (gastroesophageal reflux disease)   . Gout   . Helicobacter pylori (H. pylori) infection    hx of  . Hemorrhoids   . Hx of colonic polyps    adenomatous  . Hyperlipidemia   . Hypertension   . Lung nodule seen on imaging study 04/07/2013   1.0 cm ground glass opacity RUL  . Myocardial infarction ([place]) 2003  . Pericardial effusion    a. Small by echo 11/2011.  Marland Kitchen Pruritic dermatitis    treated with steriods/UV light  . S/P aortic valve replacement with bioprosthetic valve and maze procedure 04/12/2013   62mm [facility] Ease bovine pericardial tissue valve   . S/P Maze operation for atrial fibrillation 04/12/2013   Complete bilateral atrial lesion set using bipolar radiofrequency and cryothermy ablation with clipping of  LA appendage  . Sleep apnea    Pt says testing was positive, intolerant of CPAP.  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  . Type II diabetes mellitus ([place])   . Unspecified hearing loss     Past Surgical History:  Procedure Laterality Date  . AORTIC VALVE REPLACEMENT N/A 04/12/2013   Procedure: AORTIC VALVE REPLACEMENT (AVR);  Surgeon: Rexene Alberts, MD;  Location: [place];  Service: Open Heart Surgery;  Laterality: N/A;  .  ARTERIOVENOUS GRAFT PLACEMENT Left   . ARTERIOVENOUS GRAFT PLACEMENT Left    "I've had 2 on my left; had one removed" (04/05/2013)   . ARTERY EXPLORATION Right 04/11/2013   Procedure: ARTERY EXPLORATION;  Surgeon: Rexene Alberts, MD;  Location: [place];  Service: Open Heart Surgery;  Laterality: Right;  Right carotid artery exploration  . AV FISTULA PLACEMENT Right   . AV FISTULA REPAIR Right    "took it out" ((/18/2014)  . CARDIOVERSION  05/29/2012   Procedure: CARDIOVERSION;  Surgeon: Lelon Perla, MD;  Location[name] ENDOSCOPY;  Service: Cardiovascular;  Laterality: N/A;  . CHOLECYSTECTOMY  2009   with hernia removal  . CORONARY ANGIOPLASTY WITH STENT PLACEMENT Right    coronary artery  . INSERTION OF DIALYSIS CATHETER Bilateral    "over the years; took them both out" (04/05/2013)  . INTRAOPERATIVE TRANSESOPHAGEAL ECHOCARDIOGRAM N/A 04/11/2013   Procedure: INTRAOPERATIVE TRANSESOPHAGEAL ECHOCARDIOGRAM;  Surgeon: Rexene Alberts, MD;  Location: [place];  Service: Open Heart Surgery;  Laterality: N/A;  . INTRAOPERATIVE TRANSESOPHAGEAL ECHOCARDIOGRAM N/A 04/12/2013   Procedure: INTRAOPERATIVE TRANSESOPHAGEAL ECHOCARDIOGRAM;  Surgeon: Rexene Alberts, MD;  Location: [place];  Service: Open Heart Surgery;  Laterality: N/A;  . KIDNEY TRANSPLANT  03/16/11  . LEFT AND RIGHT HEART CATHETERIZATION WITH CORONARY ANGIOGRAM N/A 04/06/2013   Procedure: LEFT AND RIGHT HEART CATHETERIZATION WITH CORONARY ANGIOGRAM;  Surgeon: Blane Ohara, MD;  Location[facility] CATH LAB;  Service: Cardiovascular;  Laterality: N/A;  . MAZE N/A 04/12/2013   Procedure: MAZE;  Surgeon: Rexene Alberts, MD;  Location: [place];  Service: Open Heart Surgery;  Laterality: N/A;  . NASAL RECONSTRUCTION WITH SEPTAL REPAIR     "took it out" (04/05/2013)  . NEPHRECTOMY Left 2010   no CA on bx  . TONSILLECTOMY    . TOTAL ABDOMINAL HYSTERECTOMY    . TUBAL LIGATION       Current Outpatient Medications  Medication Sig Dispense Refill  .  amLODipine (NORVASC) 10 MG tablet TAKE 1/2 Tablet by mouth daily    . amoxicillin (AMOXIL) 500 MG capsule Take 2 g 1 h prior to dental appointment 12 capsule 1  . dapsone 25 MG tablet Take 25 mg by mouth daily.     . diphenhydrAMINE (BENADRYL) 50 MG capsule Take 1 capsule (50 mg total) by mouth every 6 (six) hours as needed for itching. 90 capsule 3  . furosemide (LASIX) 20 MG tablet Take 60 mg by mouth daily.  10  . gabapentin (NEURONTIN) 100 MG capsule TAKE 1 CAPSULE (100 MG TOTAL) BY MOUTH 3 (THREE) TIMES DAILY. 270 capsule 1  . HUMALOG KWIKPEN 100 UNIT/ML KiwkPen IF SUGAR:140-200 USE 8 UNITS,201-230 10 UNITS,231-260 12 UNITS,261-300 14 UNITS,>301 16 UNITS 9 pen 11  . Insulin Pen Needle 31G X 5 MM MISC Use to administer insulin four times a day Dx E11.9 130  each 5  . KLOR-CON M20 20 MEQ tablet TAKE 2 TABLETS (40 MEQ TOTAL) BY MOUTH DAILY. 60 tablet 5  . levothyroxine (SYNTHROID, LEVOTHROID) 25 MCG tablet TAKE 2 TABLETS (50 MCG TOTAL) BY MOUTH DAILY. 60 tablet 2  . metoprolol tartrate (LOPRESSOR) 25 MG tablet TAKE 1 TABLET BY MOUTH TWICE A DAY *NEED APPOINTMENT FOR REFILLS* 180 tablet 1  . Multiple Vitamins-Minerals (CENTRUM SILVER 50+WOMEN) TABS Take 1 tablet by mouth daily with breakfast.    . mycophenolate (MYFORTIC) 180 MG EC tablet Take 360 mg by mouth 2 (two) times daily.     [name] DELICA LANCETS 84Z MISC Use to check blood sugars twice a day DX E11.9 100 each 5  . ONETOUCH VERIO test strip USE AS INSTRUCTED 4 TIMES A DAY (BEFORE MEALS) & AT BEDTIME (ICD 10-E11.9) 150 each 3  . oxyCODONE-acetaminophen (ROXICET) 5-325 MG tablet Take 0.5-1 tablets by mouth every 6 (six) hours as needed for severe pain. 100 tablet 0  . tacrolimus (PROGRAF) 1 MG capsule Take 3 tablets by mouth twice daily    . warfarin (COUMADIN) 5 MG tablet TAKE 1 TO 1.5 TABLETS BY MOUTH DAILY AS DIRECTED BY COUMADIN CLINIC 120 tablet 0   No current facility-administered medications for this visit.     Allergies:    Ibuprofen; Sulfamethoxazole-trimethoprim; Sulfonamide derivatives; Tape; Tramadol; Doxycycline; Hydrocil [psyllium]; Bactrim; and Red dye    Social History:  The patient  reports that she quit smoking about 15 years ago. Her smoking use included cigarettes. She has a 30.00 pack-year smoking history. she has never used smokeless tobacco. She reports that she does not drink alcohol or use drugs.   Family History:  The patient's family history includes Diabetes in her maternal grandmother, other, and son; Hypertension in her mother; Stroke in her father.    ROS:  Please see the history of present illness.   Otherwise, review of systems are positive for none.   All other systems are reviewed and negative.    PHYSICAL EXAM: VS:  BP (!) 130/52   Pulse 74   Ht 5\' 1"  (1.549 m)   Wt 170 lb 6.4 oz (77.3 kg)   SpO2 93%   BMI 32.20 kg/m  , BMI Body mass index is 32.2 kg/m. GEN: Well nourished, well developed, in no acute distress  HEENT: normal  Neck: no JVD, carotid bruits, or masses Cardiac: RRR; no  rubs, or gallops,no edema . 3/6 crescendo decrescendo systolic murmur in the aortic area which is mid peaking. Respiratory:  clear to auscultation bilaterally, normal work of breathing GI: soft, nontender, nondistended, + BS MS: no deformity or atrophy  Skin: warm and dry, no rash Neuro:  Strength and sensation are intact Psych: euthymic mood, full affect Vascular: Femoral pulses +1 bilaterally. Distal pulses are not palpable.  EKG:  EKG is not ordered today.   Recent Labs: 12/27/2016: B Natriuretic Peptide 1,653.2 01/07/2017: ALT 17; BUN 34; Creatinine, Ser 1.54; Hemoglobin 12.4; Platelets 249.0; Potassium 4.7; Sodium 141; TSH 2.08    Lipid Panel    Component Value Date/Time   CHOL 174 11/18/2016 1354   TRIG 131.0 11/18/2016 1354   TRIG 121 06/13/2006 1525   HDL 75.10 11/18/2016 1354   CHOLHDL 2 11/18/2016 1354   VLDL 26.2 11/18/2016 1354   LDLCALC 72 11/18/2016 1354   LDLDIRECT  89.3 06/13/2006 1525      Wt Readings from Last 3 Encounters:  06/14/17 170 lb 6.4 oz (77.3 kg)  06/01/17 170 lb (  77.1 kg)  04/12/17 172 lb 6.4 oz (78.2 kg)       PAD Screen 04/12/2017  Previous PAD dx? Yes  Previous surgical procedure? No  Pain with walking? Yes  Subsides with rest? No  Feet/toe relief with dangling? Yes  Painful, non-healing ulcers? No  Extremities discolored? No      ASSESSMENT AND PLAN:  1.  Peripheral arterial disease with severe right calf claudication and moderate left calf claudication: Her symptoms are stable with no evidence of critical limb ischemia.  I again discussed with her the natural history and management of claudication.  Unfortunately, duplex showed mostly SFA disease with possible occlusion.  Given her chronic kidney disease and kidney transplant status, she is at high risk for contrast-induced nephropathy.  Thus, angiography and endovascular intervention should probably be reserved for critical limb ischemia.  I encouraged her to continue with walking and instructed her on proper foot hygiene.  2. Status post aortic valve replacement: Stable.  3.  The patient does not have hyperlipidemia as most recent lipid profile showed a total cholesterol of 174, HDL of 75 and an LDL of 72.  However, given the presence of peripheral arterial disease, coronary artery disease and diabetes, we should strongly consider treatment with a statin regardless.  4. Coronary artery disease: No anginal symptoms.   Disposition:   FU with me in 6 months  Signed,  Kathlyn Sacramento, MD  06/14/2017 12:01 PM    [place]

## 2017-06-14 NOTE — Patient Instructions (Signed)
Medication Instructions: Your physician recommends that you continue on your current medications as directed. Please refer to the Current Medication list given to you today.  If you need a refill on your cardiac medications before your next appointment, please call your pharmacy.   Follow-Up: Your physician wants you to follow-up in 6 months with Dr. Fletcher Anon. You will receive a reminder letter in the mail two months in advance. If you don't receive a letter, please call our office at [phone] to schedule this follow-up appointment.   Thank you for choosing [facility] at [facility]!!

## 2017-06-22 ENCOUNTER — Other Ambulatory Visit: Payer: Self-pay | Admitting: Internal Medicine

## 2017-06-23 ENCOUNTER — Other Ambulatory Visit: Payer: Self-pay | Admitting: General Practice

## 2017-06-23 ENCOUNTER — Ambulatory Visit: Payer: Self-pay | Admitting: *Deleted

## 2017-06-23 MED ORDER — GLUCOSE BLOOD VI STRP: ORAL_STRIP | 3 refills | Status: DC

## 2017-06-25 ENCOUNTER — Other Ambulatory Visit: Payer: Self-pay | Admitting: Cardiology

## 2017-07-05 ENCOUNTER — Ambulatory Visit (HOSPITAL_COMMUNITY)
Admission: RE | Admit: 2017-07-05 | Discharge: 2017-07-05 | Disposition: A | Discharge status: Home / Self Care | Payer: Medicare Other | Source: Ambulatory Visit | Attending: Nurse Practitioner | Admitting: Nurse Practitioner

## 2017-07-05 ENCOUNTER — Encounter (HOSPITAL_COMMUNITY): Payer: Self-pay | Admitting: Nurse Practitioner

## 2017-07-05 VITALS — BP 162/64 | HR 66 | Ht 61.0 in | Wt 172.0 lb

## 2017-07-05 DIAGNOSIS — G4730 Sleep apnea, unspecified: Secondary | ICD-10-CM | POA: Diagnosis not present

## 2017-07-05 DIAGNOSIS — Z955 Presence of coronary angioplasty implant and graft: Secondary | ICD-10-CM | POA: Insufficient documentation

## 2017-07-05 DIAGNOSIS — I509 Heart failure, unspecified: Secondary | ICD-10-CM | POA: Insufficient documentation

## 2017-07-05 DIAGNOSIS — Z8673 Personal history of transient ischemic attack (TIA), and cerebral infarction without residual deficits: Secondary | ICD-10-CM | POA: Insufficient documentation

## 2017-07-05 DIAGNOSIS — I350 Nonrheumatic aortic (valve) stenosis: Secondary | ICD-10-CM | POA: Insufficient documentation

## 2017-07-05 DIAGNOSIS — I132 Hypertensive heart and chronic kidney disease with heart failure and with stage 5 chronic kidney disease, or end stage renal disease: Secondary | ICD-10-CM | POA: Diagnosis not present

## 2017-07-05 DIAGNOSIS — I4891 Unspecified atrial fibrillation: Secondary | ICD-10-CM | POA: Diagnosis not present

## 2017-07-05 DIAGNOSIS — Z87891 Personal history of nicotine dependence: Secondary | ICD-10-CM | POA: Diagnosis not present

## 2017-07-05 DIAGNOSIS — Z953 Presence of xenogenic heart valve: Secondary | ICD-10-CM | POA: Insufficient documentation

## 2017-07-05 DIAGNOSIS — N186 End stage renal disease: Secondary | ICD-10-CM | POA: Insufficient documentation

## 2017-07-05 DIAGNOSIS — M109 Gout, unspecified: Secondary | ICD-10-CM | POA: Insufficient documentation

## 2017-07-05 DIAGNOSIS — Z794 Long term (current) use of insulin: Secondary | ICD-10-CM | POA: Diagnosis not present

## 2017-07-05 DIAGNOSIS — I2510 Atherosclerotic heart disease of native coronary artery without angina pectoris: Secondary | ICD-10-CM | POA: Insufficient documentation

## 2017-07-05 DIAGNOSIS — D631 Anemia in chronic kidney disease: Secondary | ICD-10-CM | POA: Diagnosis not present

## 2017-07-05 DIAGNOSIS — I252 Old myocardial infarction: Secondary | ICD-10-CM | POA: Diagnosis not present

## 2017-07-05 DIAGNOSIS — E1122 Type 2 diabetes mellitus with diabetic chronic kidney disease: Secondary | ICD-10-CM | POA: Diagnosis not present

## 2017-07-05 DIAGNOSIS — E785 Hyperlipidemia, unspecified: Secondary | ICD-10-CM | POA: Diagnosis not present

## 2017-07-05 DIAGNOSIS — Z79899 Other long term (current) drug therapy: Secondary | ICD-10-CM | POA: Diagnosis not present

## 2017-07-05 DIAGNOSIS — I482 Chronic atrial fibrillation: Secondary | ICD-10-CM | POA: Insufficient documentation

## 2017-07-05 DIAGNOSIS — Z882 Allergy status to sulfonamides status: Secondary | ICD-10-CM | POA: Diagnosis not present

## 2017-07-05 DIAGNOSIS — K219 Gastro-esophageal reflux disease without esophagitis: Secondary | ICD-10-CM | POA: Insufficient documentation

## 2017-07-05 NOTE — Progress Notes (Signed)
[name] [name], female   DOB: [birth year], 71 y.o.   MRN: [id number]     Primary Care Physician: [name], MD Referring Physician: Dr. [name] Buckels is a 71 y.o. female with a h/o  status post aortic valve replacement using a bioprosthetic tissue valve with maze procedure on 04/12/2013.  Since then she has remained clinically stable. She describes stable symptoms of exertional shortness of breath that occur only when she is doing moderate activity. She denies any palpitations or other symptoms to suggest a recurrence of atrial fibrillation. She continues on warfarin. She is also being followed for a stable lung nodule by Dr. Roxy Manns.  F/u in afib clinic for long term f/u of maze procedure . She is here for one year f/u. She continues to do well without any evidence of afib. She continues on coumadin for CVA in 2009.She walks with a cane but no falls.  She reports that she was in the hospital in the summer for CHF and fluid retention. She had gotten slack in her diet and started eating a lot of salt. Since then she has been very careful with salt and her fluid status has been stable.  Today, she denies symptoms of palpitations, chest pain, shortness of breath, orthopnea, PND, lower extremity edema, dizziness, presyncope, syncope, or neurologic sequela. The patient is tolerating medications without difficulties and is otherwise without complaint today.   Past Medical History:  Diagnosis Date  . Anemia, iron deficiency    of chronic disease  . Aortic stenosis    a. Severe AS by echo 11/2012.  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 Asystole ([place])    a. During ENT surgery 2005: developed marked asystole requiring CPR, felt due to vagal reaction (cath nonobst dz).  . Carotid artery disease ([place])    a. Carotid Dopplers performed in August 2013 showed 40-59% left stenosis and 0-39% right; f/u recommended in 2 years.   . Cerebrovascular accident  [facility]) 2009   a. LMCA infarct felt embolic 6433, maintained on chronic coumadin.; denies residual on 04/05/2013  . CHF (congestive heart failure) ([place])   . Cholelithiasis   . Chronic cough onset 03/2010   Dr Melvyn Novas  . Chronic Persistent Atrial Fibrillation 12/31/2008   Qualifier: Diagnosis of  By: [name]    . Coronary artery disease 05/2002   a. Ant MI 2003 s/p PTCA/stent to RCA.   . Diverticulosis of colon   . Esophagitis, reflux   . ESRD (end stage renal disease) ([place])    a. Mass on L kidney per pt s/p nephrectomy - pt states not cancer - [facility] notes indicate ESRD due to HTN/DM - was previously on HD. b. Kidney transplant 02/2011.  Marland Kitchen GERD (gastroesophageal reflux disease)   . Gout   . Helicobacter pylori (H. pylori) infection    hx of  . Hemorrhoids   . Hx of colonic polyps    adenomatous  . Hyperlipidemia   . Hypertension   . Lung nodule seen on imaging study 04/07/2013   1.0 cm ground glass opacity RUL  . Myocardial infarction ([place]) 2003  . Pericardial effusion    a. Small by echo 11/2011.  Marland Kitchen Pruritic dermatitis    treated with steriods/UV light  . S/P aortic valve replacement with bioprosthetic valve and maze procedure 04/12/2013   60mm [facility] Ease bovine pericardial tissue valve   . S/P Maze operation for atrial fibrillation 04/12/2013  Complete bilateral atrial lesion set using bipolar radiofrequency and cryothermy ablation with clipping of LA appendage  . Sleep apnea    Pt says testing was positive, intolerant of CPAP.  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  . Type II diabetes mellitus ([place])   . Unspecified hearing loss    Past Surgical History:  Procedure Laterality Date  . AORTIC VALVE REPLACEMENT N/A 04/12/2013   Procedure: AORTIC VALVE REPLACEMENT (AVR);  Surgeon: Rexene Alberts, MD;  Location:  [place];  Service: Open Heart Surgery;  Laterality: N/A;  . ARTERIOVENOUS GRAFT PLACEMENT Left   . ARTERIOVENOUS GRAFT PLACEMENT Left    "I've had 2 on my left; had one removed" (04/05/2013)   . ARTERY EXPLORATION Right 04/11/2013   Procedure: ARTERY EXPLORATION;  Surgeon: Rexene Alberts, MD;  Location: [place];  Service: Open Heart Surgery;  Laterality: Right;  Right carotid artery exploration  . AV FISTULA PLACEMENT Right   . AV FISTULA REPAIR Right    "took it out" ((/18/2014)  . CARDIOVERSION  05/29/2012   Procedure: CARDIOVERSION;  Surgeon: Lelon Perla, MD;  Location: [facility] ENDOSCOPY;  Service: Cardiovascular;  Laterality: N/A;  . CHOLECYSTECTOMY  2009   with hernia removal  . CORONARY ANGIOPLASTY WITH STENT PLACEMENT Right    coronary artery  . INSERTION OF DIALYSIS CATHETER Bilateral    "over the years; took them both out" (04/05/2013)  . INTRAOPERATIVE TRANSESOPHAGEAL ECHOCARDIOGRAM N/A 04/11/2013   Procedure: INTRAOPERATIVE TRANSESOPHAGEAL ECHOCARDIOGRAM;  Surgeon: Rexene Alberts, MD;  Location: [place];  Service: Open Heart Surgery;  Laterality: N/A;  . INTRAOPERATIVE TRANSESOPHAGEAL ECHOCARDIOGRAM N/A 04/12/2013   Procedure: INTRAOPERATIVE TRANSESOPHAGEAL ECHOCARDIOGRAM;  Surgeon: Rexene Alberts, MD;  Location: [place];  Service: Open Heart Surgery;  Laterality: N/A;  . KIDNEY TRANSPLANT  03/16/11  . LEFT AND RIGHT HEART CATHETERIZATION WITH CORONARY ANGIOGRAM N/A 04/06/2013   Procedure: LEFT AND RIGHT HEART CATHETERIZATION WITH CORONARY ANGIOGRAM;  Surgeon: Blane Ohara, MD;  Location: [facility] CATH LAB;  Service: Cardiovascular;  Laterality: N/A;  . MAZE N/A 04/12/2013   Procedure: MAZE;  Surgeon: Rexene Alberts, MD;  Location: [place];  Service: Open Heart Surgery;  Laterality: N/A;  . NASAL RECONSTRUCTION WITH SEPTAL REPAIR     "took it out" (04/05/2013)  . NEPHRECTOMY Left 2010   no CA on bx  . TONSILLECTOMY    . TOTAL ABDOMINAL HYSTERECTOMY    . TUBAL LIGATION      Current  Outpatient Medications  Medication Sig Dispense Refill  . amLODipine (NORVASC) 10 MG tablet TAKE 1/2 Tablet by mouth daily    . amoxicillin (AMOXIL) 500 MG capsule Take 2 g 1 h prior to dental appointment 12 capsule 1  . dapsone 25 MG tablet Take 25 mg by mouth daily.     . diphenhydrAMINE (BENADRYL) 50 MG capsule Take 1 capsule (50 mg total) by mouth every 6 (six) hours as needed for itching. 90 capsule 3  . furosemide (LASIX) 20 MG tablet Take 60 mg by mouth daily.  10  . gabapentin (NEURONTIN) 100 MG capsule TAKE 1 CAPSULE (100 MG TOTAL) BY MOUTH 3 (THREE) TIMES DAILY. 270 capsule 1  . glucose blood (ONETOUCH VERIO) test strip USE AS INSTRUCTED 4 TIMES A DAY (BEFORE MEALS) & AT BEDTIME (ICD 10-E11.9) 150 each 3  . HUMALOG KWIKPEN 100 UNIT/ML KiwkPen  IF SUGAR:140-200 USE 8 UNITS,201-230 10 UNITS,231-260 12 UNITS,261-300 14 UNITS,>301 16 UNITS 9 pen 11  . Insulin Pen Needle 31G X 5 MM MISC Use to administer insulin four times a day Dx E11.9 130 each 5  . KLOR-CON M20 20 MEQ tablet TAKE 2 TABLETS (40 MEQ TOTAL) BY MOUTH DAILY. 60 tablet 5  . levothyroxine (SYNTHROID, LEVOTHROID) 25 MCG tablet TAKE 2 TABLETS (50 MCG TOTAL) BY MOUTH DAILY. 60 tablet 2  . metoprolol tartrate (LOPRESSOR) 25 MG tablet TAKE 1 TABLET BY MOUTH TWICE A DAY *NEED APPOINTMENT FOR REFILLS* 180 tablet 1  . Multiple Vitamins-Minerals (CENTRUM SILVER 50+WOMEN) TABS Take 1 tablet by mouth daily with breakfast.    . mycophenolate (MYFORTIC) 180 MG EC tablet Take 360 mg by mouth 2 (two) times daily.     [name] DELICA LANCETS 83T MISC Use to check blood sugars twice a day DX E11.9 100 each 5  . ONETOUCH VERIO test strip USE AS INSTRUCTED 4 TIMES A DAY (BEFORE MEALS) & AT BEDTIME (ICD 10-E11.9) 150 each 3  . oxyCODONE-acetaminophen (ROXICET) 5-325 MG tablet Take 0.5-1 tablets by mouth every 6 (six) hours as needed for severe pain. 100 tablet 0  . tacrolimus (PROGRAF) 1 MG capsule Take 3 tablets by mouth twice daily    . warfarin  (COUMADIN) 5 MG tablet TAKE 1 TO 1.5 TABLETS BY MOUTH DAILY AS DIRECTED BY COUMADIN CLINIC 120 tablet 0   No current facility-administered medications for this encounter.     Allergies  Allergen Reactions  . Ibuprofen Nausea And Vomiting  . Sulfamethoxazole-Trimethoprim Itching, Swelling and Rash    Swelling of the face  . Sulfonamide Derivatives Itching, Swelling and Rash    Swelling of the face  . Tape Rash  . Tramadol Nausea And Vomiting  . Doxycycline     nausea  . Hydrocil [Psyllium] Nausea And Vomiting  . Bactrim Itching, Swelling and Rash  . Red Dye Itching and Rash    Social History   Socioeconomic History  . Marital status: Married    Spouse name: Not on file  . Number of children: 5  . Years of education: 92  . Highest education level: Not on file  Social Needs  . Financial resource strain: Not on file  . Food insecurity - worry: Not on file  . Food insecurity - inability: Not on file  . Transportation needs - medical: Not on file  . Transportation needs - non-medical: Not on file  Occupational History  . Occupation: retired    Fish farm manager: RETIRED  Tobacco Use  . Smoking status: Former Smoker    Packs/day: 1.00    Years: 30.00    Pack years: 30.00    Types: Cigarettes    Last attempt to quit: 07/19/2001    Years since quitting: 15.9  . Smokeless tobacco: Never Used  Substance and Sexual Activity  . Alcohol use: No  . Drug use: No  . Sexual activity: Not Currently  Other Topics Concern  . Not on file  Social History Narrative   Patient signed a Designated Party Release to allow her spouse [name] and family and five children to have access to her medical records/information.     Patient lives with husband in a 2 story home.  Has 5 children. Retired from Personal assistant.  Education: 3 years of college.     Family History  Problem Relation Age of Onset  . Stroke Father   . Hypertension Mother   .  Diabetes Other   . Diabetes Maternal Grandmother   .  Diabetes Son   . Breast cancer Neg Hx     ROS- All systems are reviewed and negative except as per the HPI above  Physical Exam: Vitals:   07/05/17 1433  BP: (!) 162/64  Pulse: 66  Weight: 172 lb (78 kg)  Height: 5\' 1"  (1.549 m)    GEN- The patient is well appearing, alert and oriented x 3 today.   Head- normocephalic, atraumatic Eyes-  Sclera clear, conjunctiva pink Ears- hearing intact Oropharynx- clear Neck- supple, no JVP Lymph- no cervical lymphadenopathy Lungs- Clear to ausculation bilaterally, normal work of breathing Heart- Regular rate and rhythm, no murmurs, rubs or gallops, PMI not laterally displaced GI- soft, NT, ND, + BS Extremities- no clubbing, cyanosis, or edema MS- no significant deformity or atrophy Skin- no rash or lesion Psych- euthymic mood, full affect Neuro- strength and sensation are intact  EKG-SR at 66 bpm, pr int 150 ms, qrs int 100 ms, qtc 442 ms Epic records reviewed  Assessment and Plan: 1. Afib s/p maze 2014 Doing well long term without any evidence of afib, maintaining SR Denies any shortness of breath Continues on warfain for previous CVA  Continue metoprolol  2. S/P aortic valve repalcement with bioprosthetic valve Stable   3. HTN  Stable Avoid salt  4. CHF Weight stable Avoiding salt  F/u in afib clinc in one year for long term monitoring of afib with maze procedure   [name] C. Ruthe Roemer, [facility] [address] [place], [place] [address] [phone]

## 2017-07-06 ENCOUNTER — Other Ambulatory Visit: Payer: Self-pay | Admitting: *Deleted

## 2017-07-06 ENCOUNTER — Encounter: Payer: Self-pay | Admitting: *Deleted

## 2017-07-06 NOTE — Patient Outreach (Signed)
[facility]) Care Management  [place]  07/06/2017   [name] 25-May-1946 [id number]  RN Health Coach Urgent Care Follow Up   Referral Date: 01/12/2017 Referral Source: [facility] RN Case Manager Reason for Referral: CHF education Insurance: Medicare   Outreach Attempt:  Successful telephone outreach to patient for monthly follow up.  HIPAA verified.  Patient states she is doing well.  Reports her finger is totally healed from the spider bite last month.  Denies any wounds or sores.  Continues to weigh daily.  Weight this morning is 172, with patient stating she ranges from 165-172.  Patient does verbalize that she lost her CBG meter and had to get a new one.  Reports her fasting blood sugars have been varying from 85-260 and she has been utilizing her prescribed sliding scale insulin to cover the hyperglycemic episodes.  This morning fasting blood sugar 190.  Continues to ambulate with cane and denies any falls.  Encounter Medications:  Outpatient Encounter Medications as of 07/06/2017  Medication Sig  . amLODipine (NORVASC) 10 MG tablet TAKE 1/2 Tablet by mouth daily  . dapsone 25 MG tablet Take 25 mg by mouth daily.   . diphenhydrAMINE (BENADRYL) 50 MG capsule Take 1 capsule (50 mg total) by mouth every 6 (six) hours as needed for itching.  . furosemide (LASIX) 20 MG tablet Take 60 mg by mouth daily.  Marland Kitchen gabapentin (NEURONTIN) 100 MG capsule TAKE 1 CAPSULE (100 MG TOTAL) BY MOUTH 3 (THREE) TIMES DAILY.  Marland Kitchen HUMALOG KWIKPEN 100 UNIT/ML KiwkPen IF SUGAR:140-200 USE 8 UNITS,201-230 10 UNITS,231-260 12 UNITS,261-300 14 UNITS,>301 16 UNITS  . KLOR-CON M20 20 MEQ tablet TAKE 2 TABLETS (40 MEQ TOTAL) BY MOUTH DAILY.  Marland Kitchen levothyroxine (SYNTHROID, LEVOTHROID) 25 MCG tablet TAKE 2 TABLETS (50 MCG TOTAL) BY MOUTH DAILY.  . metoprolol tartrate (LOPRESSOR) 25 MG tablet TAKE 1 TABLET BY MOUTH TWICE A DAY *NEED APPOINTMENT FOR REFILLS*  . Multiple Vitamins-Minerals (CENTRUM  SILVER 50+WOMEN) TABS Take 1 tablet by mouth daily with breakfast.  . mycophenolate (MYFORTIC) 180 MG EC tablet Take 360 mg by mouth 2 (two) times daily.   Marland Kitchen oxyCODONE-acetaminophen (ROXICET) 5-325 MG tablet Take 0.5-1 tablets by mouth every 6 (six) hours as needed for severe pain.  Marland Kitchen tacrolimus (PROGRAF) 1 MG capsule Take 3 tablets by mouth twice daily  . warfarin (COUMADIN) 5 MG tablet TAKE 1 TO 1.5 TABLETS BY MOUTH DAILY AS DIRECTED BY COUMADIN CLINIC  . amoxicillin (AMOXIL) 500 MG capsule Take 2 g 1 h prior to dental appointment  . glucose blood (ONETOUCH VERIO) test strip USE AS INSTRUCTED 4 TIMES A DAY (BEFORE MEALS) & AT BEDTIME (ICD 10-E11.9)  . Insulin Pen Needle 31G X 5 MM MISC Use to administer insulin four times a day Dx E11.9  . ONETOUCH DELICA LANCETS 24M MISC Use to check blood sugars twice a day DX E11.9  . ONETOUCH VERIO test strip USE AS INSTRUCTED 4 TIMES A DAY (BEFORE MEALS) & AT BEDTIME (ICD 10-E11.9)   No facility-administered encounter medications on file as of 07/06/2017.     Functional Status:  In your present state of health, do you have any difficulty performing the following activities: 05/25/2017 01/21/2017  Hearing? N N  Vision? N N  Difficulty concentrating or making decisions? N N  Walking or climbing stairs? Y Y  Dressing or bathing? N N  Doing errands, shopping? N N  Preparing Food and eating ? N N  Using the Toilet?  N N  In the past six months, have you accidently leaked urine? Y Y  Do you have problems with loss of bowel control? N N  Managing your Medications? N N  Managing your Finances? N N  Housekeeping or managing your Housekeeping? N N  Some recent data might be hidden    Fall/Depression Screening: Fall Risk  05/05/2017 02/22/2017 01/21/2017  Falls in the past year? Yes Yes Yes  Comment - No recent falls. -  Number falls in past yr: 1 - 1  Injury with Fall? No - No  Risk for fall due to : History of fall(s);Impaired balance/gait;Impaired  mobility - History of fall(s)  Follow up Falls evaluation completed;Falls prevention discussed;Education provided - Falls evaluation completed;Falls prevention discussed;Education provided   [facility] 2/9 Scores 05/05/2017 01/21/2017 01/12/2017 11/21/2015 11/11/2014 06/04/2013  PHQ - 2 Score 0 0 0 0 0 0   [facility] CM Care Plan Problem One     Most Recent Value  Care Plan Problem One  Knowledge deficiet related to self care management of congestive heart failure  Role Documenting the Problem One  [place] for Problem One  Active  THN Long Term Goal   Patient will report no hospitalizations or emergency room visits in the next 90 days  THN Long Term Goal Start Date  07/06/17  Interventions for Problem One Long Term Goal  Encouraged patient to weigh daily and to know her baseline weight,  utilized teachback to have patient verbalize when to weigh daily, reviewed the Green/Yellow/Red zones with patient and when to call the physician, Encouraged patient to continue to review educational material,  discussed with patient the importance to notify the physician of 3 pound weight gain overnight,  discussed with patient this care plan and goal of staying out of the hospital,  Reinforced the availability of the 24 hour nurse line   [facility]N CM Short Term Goal #1   Patient will be able to name at least 4 yellow zone symptoms and describe action needed should they occur within 30 days.  THN CM Short Term Goal #1 Start Date  07/06/17  THN CM Short Term Goal #1 Met Date  -- [goal not met/reinstated]  Interventions for Short Term Goal #1  Reinforced importance of contacting Cardiolpogist for symptoms,  Reviewed symptoms in the yellow zone, encouraged patient to review heart failure education booklet and zones, reviewed with patient handout on know before you go,  discussed symptoms of the green zone and the importance to recognize symptoms in the yellow zone  [facility] CM Short Term Goal #2   Patient will be name 4 foods high  in sodium to avoid in the next 30 days  [facility]N CM Short Term Goal #2 Start Date  07/06/17  Interventions for Short Term Goal #2  Discussed reading food labels to look at sodium content, Encouraged patient to continue to read the EMMIs sent on foods high in sodium,  encouraged patient to choice foods lower in sodium,  discussed the benefits of cooking at home instead of eating out to monitor salt intake,  discussed some foods high in sodium to avoid such as canned foods, frozen dinners, and processed meats     Appointments:  Has scheduled follow up appointment with primary care provider, Dr. Alain Marion on 09/02/2017.  Scheduled appointment with Cardiologist on 07/13/2017.  Was seen in the [facility] yesterday, 07/05/2017.  Plan: RN Health Coach to send primary care provider quarterly update. RN Health Coach to  make next monthly telephone outreach to patient in the month of January.  [place] [phone] [email or web address]

## 2017-07-15 DIAGNOSIS — Z940 Kidney transplant status: Secondary | ICD-10-CM | POA: Diagnosis not present

## 2017-07-21 ENCOUNTER — Ambulatory Visit (INDEPENDENT_AMBULATORY_CARE_PROVIDER_SITE_OTHER): Payer: Medicare Other | Admitting: Pharmacist

## 2017-07-21 DIAGNOSIS — Z5181 Encounter for therapeutic drug level monitoring: Secondary | ICD-10-CM

## 2017-07-21 DIAGNOSIS — I4891 Unspecified atrial fibrillation: Secondary | ICD-10-CM | POA: Diagnosis not present

## 2017-07-21 LAB — POCT INR: INR: 1.8

## 2017-07-26 DIAGNOSIS — H04123 Dry eye syndrome of bilateral lacrimal glands: Secondary | ICD-10-CM | POA: Diagnosis not present

## 2017-07-26 DIAGNOSIS — H11153 Pinguecula, bilateral: Secondary | ICD-10-CM | POA: Diagnosis not present

## 2017-07-26 DIAGNOSIS — H2513 Age-related nuclear cataract, bilateral: Secondary | ICD-10-CM | POA: Diagnosis not present

## 2017-07-26 DIAGNOSIS — H524 Presbyopia: Secondary | ICD-10-CM | POA: Diagnosis not present

## 2017-07-26 DIAGNOSIS — H16142 Punctate keratitis, left eye: Secondary | ICD-10-CM | POA: Diagnosis not present

## 2017-08-01 ENCOUNTER — Encounter: Payer: Self-pay | Admitting: *Deleted

## 2017-08-01 ENCOUNTER — Other Ambulatory Visit: Payer: Self-pay | Admitting: *Deleted

## 2017-08-01 NOTE — Patient Outreach (Addendum)
[facility]) Care Management  08/01/2017  [name] August 30, 1945 [id number]  RN Health Coach Monthly Outreach  Referral Date:01/12/2017 Referral Source:[facility] RN Case Manager Reason for Referral:CHF education Insurance:Medicare   Outreach Attempt:  Successful telephone outreach to patient for monthly follow up.  HIPAA confirmed.  Patient stating she is doing fine.  Denies any sick days over the past month.  Continues to weigh daily.  Weight this morning was 166 pounds.  Verbalizes she was able to stick to her diet over the holidays and did not eat much salt or foods high in sodium.  Denies any shortness of breath or swelling in lower extremities.  Does report blood sugars ranging in the 170-200's and continues to utilize her sliding scale insulin for elevations.  Appointments:  Patient has scheduled appointments with Dr. Alain Marion on 09/02/2017 and Dr. [name] on 08/18/2017.  Plan:  RN Health Coach will make next monthly outreach to patient in the month of February.   [place] [phone] [name]

## 2017-08-04 ENCOUNTER — Other Ambulatory Visit: Payer: Self-pay | Admitting: Internal Medicine

## 2017-08-05 ENCOUNTER — Other Ambulatory Visit: Payer: Self-pay | Admitting: Internal Medicine

## 2017-08-15 DIAGNOSIS — D899 Disorder involving the immune mechanism, unspecified: Secondary | ICD-10-CM | POA: Diagnosis not present

## 2017-08-15 DIAGNOSIS — E119 Type 2 diabetes mellitus without complications: Secondary | ICD-10-CM | POA: Diagnosis not present

## 2017-08-15 DIAGNOSIS — R809 Proteinuria, unspecified: Secondary | ICD-10-CM | POA: Diagnosis not present

## 2017-08-15 DIAGNOSIS — Z940 Kidney transplant status: Secondary | ICD-10-CM | POA: Diagnosis not present

## 2017-08-15 DIAGNOSIS — I10 Essential (primary) hypertension: Secondary | ICD-10-CM | POA: Diagnosis not present

## 2017-08-18 ENCOUNTER — Ambulatory Visit (INDEPENDENT_AMBULATORY_CARE_PROVIDER_SITE_OTHER): Payer: Medicare Other | Admitting: Pharmacist

## 2017-08-18 DIAGNOSIS — Z5181 Encounter for therapeutic drug level monitoring: Secondary | ICD-10-CM

## 2017-08-18 DIAGNOSIS — I4891 Unspecified atrial fibrillation: Secondary | ICD-10-CM

## 2017-08-18 LAB — POCT INR: INR: 3.3

## 2017-09-02 ENCOUNTER — Encounter: Payer: Self-pay | Admitting: Internal Medicine

## 2017-09-02 ENCOUNTER — Ambulatory Visit (INDEPENDENT_AMBULATORY_CARE_PROVIDER_SITE_OTHER): Payer: Medicare Other | Admitting: Internal Medicine

## 2017-09-02 ENCOUNTER — Other Ambulatory Visit (INDEPENDENT_AMBULATORY_CARE_PROVIDER_SITE_OTHER): Payer: Medicare Other

## 2017-09-02 VITALS — BP 138/76 | HR 56 | Temp 98.4°F | Ht 61.0 in | Wt 174.0 lb

## 2017-09-02 DIAGNOSIS — M5440 Lumbago with sciatica, unspecified side: Secondary | ICD-10-CM | POA: Diagnosis not present

## 2017-09-02 DIAGNOSIS — I2510 Atherosclerotic heart disease of native coronary artery without angina pectoris: Secondary | ICD-10-CM | POA: Diagnosis not present

## 2017-09-02 DIAGNOSIS — I5022 Chronic systolic (congestive) heart failure: Secondary | ICD-10-CM

## 2017-09-02 DIAGNOSIS — E0865 Diabetes mellitus due to underlying condition with hyperglycemia: Secondary | ICD-10-CM | POA: Diagnosis not present

## 2017-09-02 DIAGNOSIS — G8929 Other chronic pain: Secondary | ICD-10-CM

## 2017-09-02 DIAGNOSIS — Z23 Encounter for immunization: Secondary | ICD-10-CM | POA: Diagnosis not present

## 2017-09-02 DIAGNOSIS — N186 End stage renal disease: Secondary | ICD-10-CM

## 2017-09-02 LAB — BASIC METABOLIC PANEL
BUN: 30 mg/dL — ABNORMAL HIGH (ref 6–23)
CHLORIDE: 101 meq/L (ref 96–112)
CO2: 30 mEq/L (ref 19–32)
CREATININE: 1.65 mg/dL — AB (ref 0.40–1.20)
Calcium: 9.7 mg/dL (ref 8.4–10.5)
GFR: 39.15 mL/min — ABNORMAL LOW (ref >60.00)
Glucose, Bld: 212 mg/dL — ABNORMAL HIGH (ref 70–99)
Potassium: 3.8 mEq/L (ref 3.5–5.1)
SODIUM: 138 meq/L (ref 135–145)

## 2017-09-02 LAB — TSH: TSH: 2.93 u[IU]/mL (ref 0.35–4.50)

## 2017-09-02 LAB — HEMOGLOBIN A1C: HEMOGLOBIN A1C: 8.7 % — AB (ref 4.6–6.5)

## 2017-09-02 LAB — HEPATIC FUNCTION PANEL
ALBUMIN: 3.4 g/dL — AB (ref 3.5–5.2)
ALT: 12 U/L (ref 0–35)
AST: 14 U/L (ref 0–37)
Alkaline Phosphatase: 86 U/L (ref 39–117)
Bilirubin, Direct: 0 mg/dL (ref 0.0–0.3)
Total Bilirubin: 0.5 mg/dL (ref 0.2–1.2)
Total Protein: 6.6 g/dL (ref 6.0–8.3)

## 2017-09-02 MED ORDER — OXYCODONE-ACETAMINOPHEN 5-325 MG PO TABS: 0.5000 | ORAL_TABLET | Freq: Four times a day (QID) | ORAL | 0 refills | Status: DC | PRN

## 2017-09-02 MED ORDER — GABAPENTIN 300 MG PO CAPS: 300.0000 mg | ORAL_CAPSULE | Freq: Three times a day (TID) | ORAL | 5 refills | Status: DC

## 2017-09-02 NOTE — Assessment & Plan Note (Signed)
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

## 2017-09-02 NOTE — Progress Notes (Signed)
Subjective:  [name], female    DOB: [birth year]  Age: 72 y.o. MRN: [id number]  CC: No chief complaint on file.   HPI [name] presents for ESRD, HTN, DM f/u  Outpatient Medications Prior to Visit  Medication Sig Dispense Refill  . amLODipine (NORVASC) 10 MG tablet TAKE 1/2 Tablet by mouth daily    . amoxicillin (AMOXIL) 500 MG capsule Take 2 g 1 h prior to dental appointment 12 capsule 1  . dapsone 25 MG tablet Take 25 mg by mouth daily.     . diphenhydrAMINE (BENADRYL) 50 MG capsule Take 1 capsule (50 mg total) by mouth every 6 (six) hours as needed for itching. 90 capsule 3  . furosemide (LASIX) 20 MG tablet Take 60 mg by mouth daily.  10  . gabapentin (NEURONTIN) 100 MG capsule TAKE 1 CAPSULE (100 MG TOTAL) BY MOUTH 3 (THREE) TIMES DAILY. 270 capsule 1  . glucose blood (ONETOUCH VERIO) test strip USE AS INSTRUCTED 4 TIMES A DAY (BEFORE MEALS) & AT BEDTIME (ICD 10-E11.9) 150 each 3  . HUMALOG KWIKPEN 100 UNIT/ML KiwkPen IF SUGAR:140-200 USE 8 UNITS,201-230 10 UNITS,231-260 12 UNITS,261-300 14 UNITS,>301 16 UNITS 27 pen 3  . Insulin Pen Needle 31G X 5 MM MISC Use to administer insulin four times a day Dx E11.9 130 each 5  . KLOR-CON M20 20 MEQ tablet TAKE 2 TABLETS (40 MEQ TOTAL) BY MOUTH DAILY. 60 tablet 5  . levothyroxine (SYNTHROID, LEVOTHROID) 25 MCG tablet TAKE 2 TABLETS (50 MCG TOTAL) BY MOUTH DAILY. 60 tablet 2  . metoprolol tartrate (LOPRESSOR) 25 MG tablet TAKE 1 TABLET BY MOUTH TWICE A DAY *NEED APPOINTMENT FOR REFILLS* 180 tablet 1  . Multiple Vitamins-Minerals (CENTRUM SILVER 50+WOMEN) TABS Take 1 tablet by mouth daily with breakfast.    . mycophenolate (MYFORTIC) 180 MG EC tablet Take 360 mg by mouth 2 (two) times daily.     [name] DELICA LANCETS 57Q MISC Use to check blood sugars twice a day DX E11.9 100 each 5  . ONETOUCH VERIO test strip USE AS INSTRUCTED 4 TIMES A DAY (BEFORE MEALS) & AT BEDTIME (ICD 10-E11.9) 150 each 3  .  oxyCODONE-acetaminophen (ROXICET) 5-325 MG tablet Take 0.5-1 tablets by mouth every 6 (six) hours as needed for severe pain. 100 tablet 0  . tacrolimus (PROGRAF) 1 MG capsule Take 3 tablets by mouth twice daily    . warfarin (COUMADIN) 5 MG tablet TAKE 1 TO 1.5 TABLETS BY MOUTH DAILY AS DIRECTED BY COUMADIN CLINIC 120 tablet 0   No facility-administered medications prior to visit.     ROS Review of Systems  Constitutional: Positive for fatigue. Negative for activity change, appetite change, chills and unexpected weight change.  HENT: Negative for congestion, mouth sores and sinus pressure.   Eyes: Negative for visual disturbance.  Respiratory: Negative for cough and chest tightness.   Gastrointestinal: Negative for abdominal pain and nausea.  Genitourinary: Negative for difficulty urinating, frequency and vaginal pain.  Musculoskeletal: Negative for back pain and gait problem.  Skin: Negative for pallor and rash.  Neurological: Negative for dizziness, tremors, weakness, numbness and headaches.  Psychiatric/Behavioral: Positive for dysphoric mood. Negative for confusion and sleep disturbance.    Objective:  BP 138/76 (BP Location: Right Arm, Patient Position: Sitting, Cuff Size: Normal)   Pulse (!) 56   Temp 98.4 F (36.9 C) (Oral)   Ht 5\' 1"  (1.549 m)   Wt 174 lb (78.9 kg)  SpO2 97%   BMI 32.88 kg/m   BP Readings from Last 3 Encounters:  09/02/17 138/76  07/05/17 (!) 162/64  06/14/17 (!) 130/52    Wt Readings from Last 3 Encounters:  09/02/17 174 lb (78.9 kg)  07/05/17 172 lb (78 kg)  06/14/17 170 lb 6.4 oz (77.3 kg)    Physical Exam  Constitutional: She appears well-developed. No distress.  HENT:  Head: Normocephalic.  Right Ear: External ear normal.  Left Ear: External ear normal.  Nose: Nose normal.  Mouth/Throat: Oropharynx is clear and moist.  Eyes: Conjunctivae are normal. Pupils are equal, round, and reactive to light. Right eye exhibits no discharge. Left  eye exhibits no discharge.  Neck: Normal range of motion. Neck supple. No JVD present. No tracheal deviation present. No thyromegaly present.  Cardiovascular: Normal rate, regular rhythm and normal heart sounds.  Pulmonary/Chest: No stridor. No respiratory distress. She has no wheezes.  Abdominal: Soft. Bowel sounds are normal. She exhibits no distension and no mass. There is no tenderness. There is no rebound and no guarding.  Musculoskeletal: She exhibits tenderness. She exhibits no edema.  Lymphadenopathy:    She has no cervical adenopathy.  Neurological: She displays normal reflexes. No cranial nerve deficit. She exhibits normal muscle tone. Coordination abnormal.  Skin: No rash noted. No erythema.  Psychiatric: She has a normal mood and affect. Her behavior is normal. Judgment and thought content normal.  trace edema Cane  Lab Results  Component Value Date   WBC 8.4 01/07/2017   HGB 12.4 01/07/2017   HCT 39.9 01/07/2017   PLT 249.0 01/07/2017   GLUCOSE 128 (H) 01/07/2017   CHOL 174 11/18/2016   TRIG 131.0 11/18/2016   HDL 75.10 11/18/2016   LDLDIRECT 89.3 06/13/2006   LDLCALC 72 11/18/2016   ALT 17 01/07/2017   AST 23 01/07/2017   NA 141 01/07/2017   K 4.7 01/07/2017   CL 104 01/07/2017   CREATININE 1.54 (H) 01/07/2017   BUN 34 (H) 01/07/2017   CO2 32 01/07/2017   TSH 2.08 01/07/2017   INR 3.3 08/18/2017   HGBA1C 8.4 (H) 11/18/2016   MICROALBUR 317.3 (H) 07/02/2016    No results found.  Assessment & Plan:   There are no diagnoses linked to this encounter. I am having [name] maintain her mycophenolate, dapsone, diphenhydrAMINE, ONETOUCH DELICA LANCETS 51O, Insulin Pen Needle, CENTRUM SILVER 50+WOMEN, oxyCODONE-acetaminophen, KLOR-CON M20, metoprolol tartrate, furosemide, gabapentin, amoxicillin, amLODipine, tacrolimus, ONETOUCH VERIO, glucose blood, warfarin, HUMALOG KWIKPEN, and levothyroxine.  No orders of the defined types were placed in this  encounter.    Follow-up: No Follow-up on file.  Walker Kehr, MD

## 2017-09-02 NOTE — Assessment & Plan Note (Addendum)
Percocet so that she could break tab in 1/4 or 1/2's  Potential benefits of a long term oxycodone use as well as potential risks (i.e. addiction risk (low), apnea etc) and complications (i.e. Somnolence, seizures, constipation and others) were explained to the patient and were aknowledged.

## 2017-09-02 NOTE — Assessment & Plan Note (Signed)
Metoprolol, Furosemide Labs w/Dr Joelyn Oms

## 2017-09-02 NOTE — Assessment & Plan Note (Signed)
Amlodipine, Pravastatin

## 2017-09-02 NOTE — Assessment & Plan Note (Signed)
Humalog

## 2017-09-05 ENCOUNTER — Other Ambulatory Visit: Payer: Self-pay | Admitting: *Deleted

## 2017-09-05 NOTE — Patient Outreach (Signed)
[place] [facility]) Care Management  09/05/2017  [name] 02-Mar-1946 [id number]   RN Health Coach Monthly Outreach  Referral Date:01/12/2017 Referral Source:[facility] RN Case Manager Reason for Referral:CHF education Insurance:Medicare   Outreach Attempt:  Outreach attempt #1 to patient for monthly follow up.  Patient answered and requested a call back tomorrow.  Plan:  RN Health Coach will make another outreach attempt to patient within one business day.  [place] [phone] [name]

## 2017-09-06 ENCOUNTER — Other Ambulatory Visit: Payer: Self-pay | Admitting: *Deleted

## 2017-09-06 ENCOUNTER — Encounter: Payer: Self-pay | Admitting: *Deleted

## 2017-09-06 NOTE — Patient Outreach (Signed)
[facility]) Care Management  09/06/2017  Shannon Balthazar Zou [birth year] [id number]   RN Health Coach Monthly Outreach  Referral Date:01/12/2017 Referral Source:[facility] RN Case Manager Reason for Referral:CHF education Insurance:Medicare   Outreach Attempt:  Successful telephone outreach to patient for monthly follow up.  HIPAA verified with patient.  Patient stating she has gained about 5 pounds.  Continues to weight daily and weight this morning was 173 (166 last month's conversation).  States weight has been consistently between 171-173 for the last few weeks.  Weight documented 174 pounds at primary care appointment on 09/02/2017.  Denies any shortness of breath, chest pain, increase in coughing, increase in number of pillow she has to sleep on.  Does report some ankle swelling.  Reports compliance with her medications and states she is voiding well.  Patient stating she feels the weight gain is related to her diet and diet of over the holidays.  Patient encouraged to call and speak with her physician concerning weight gain.  States she has been forgetting to check her morning fasting blood sugars and frequently forgets to check her blood sugars before meals and has been checking them after she eats.  This mornings blood sugar was 261 after eating.  Reports taking her sliding scale insulin with meals, but reports she rarely eats 3 meals a day which leads to hunger and hypoglycemia.  States she gets so hungry and can feel her blood sugar dropping so she hurries to get something to eat and forgets to check her blood sugar.  Patient encouraged to eat at least 3 meals a day and encouraged to have small snack in between meals.  Discussed the importance of preventing hypoglycemia and rebound hyperglycemic events.  Discussed with patient current A1C of 8.7, patient was unaware of the result.  Encouraged patient to contact primary care provider to discuss treatment options.  Appointments:    Patient saw primary care provider, Dr. Alain Marion on 09/02/2017 and has next scheduled appointment on 11/30/2017.  Patient encouraged to keep and attend this appointment.  Plan:  RN Health Coach will send patient Living Well with Diabetes Booklet. RN Health Coach will make next monthly outreach to patient in the month of March.   Regino Ramirez [phone] [email or web address]

## 2017-09-08 ENCOUNTER — Telehealth: Payer: Self-pay

## 2017-09-08 NOTE — Telephone Encounter (Signed)
Pt notified of lab results and would like to know if a pill can be added to her medications?  (pt knows it will be Monday before hearing back)

## 2017-09-08 NOTE — Telephone Encounter (Signed)
-----   Message from [name], MD sent at 09/03/2017  2:54 PM EST ----- [name], Please inform the patient that all labs are stable, except for high sugars.  Patient is to titrate up her insulin.  Her goal blood sugar should be around 120-140.  Please mail the patient her results. Thanks, AP

## 2017-09-11 NOTE — Telephone Encounter (Signed)
I would titrate up the insulin.  Other meds (oral meds) would be problematic due to her kidney situation. Thank you

## 2017-09-12 NOTE — Telephone Encounter (Signed)
Pt.notified

## 2017-09-14 ENCOUNTER — Other Ambulatory Visit (HOSPITAL_COMMUNITY): Payer: Self-pay | Admitting: *Deleted

## 2017-09-14 MED ORDER — METOPROLOL TARTRATE 25 MG PO TABS: ORAL_TABLET | ORAL | 1 refills | Status: DC

## 2017-09-15 ENCOUNTER — Ambulatory Visit (INDEPENDENT_AMBULATORY_CARE_PROVIDER_SITE_OTHER): Payer: Medicare Other | Admitting: Pharmacist Clinician (PhC)/ Clinical Pharmacy Specialist

## 2017-09-15 DIAGNOSIS — Z7901 Long term (current) use of anticoagulants: Secondary | ICD-10-CM | POA: Diagnosis not present

## 2017-09-15 DIAGNOSIS — I4819 Other persistent atrial fibrillation: Secondary | ICD-10-CM

## 2017-09-15 DIAGNOSIS — Z5181 Encounter for therapeutic drug level monitoring: Secondary | ICD-10-CM

## 2017-09-15 DIAGNOSIS — I481 Persistent atrial fibrillation: Secondary | ICD-10-CM | POA: Diagnosis not present

## 2017-09-15 DIAGNOSIS — I4891 Unspecified atrial fibrillation: Secondary | ICD-10-CM

## 2017-09-15 LAB — POCT INR: INR: 2.1

## 2017-09-15 NOTE — Patient Instructions (Signed)
Description   Continue with 1 tablet daily except 1.5 tablets each Monday  and Friday.  Next INR in 4 weeks

## 2017-10-05 ENCOUNTER — Encounter: Payer: Self-pay | Admitting: *Deleted

## 2017-10-05 ENCOUNTER — Other Ambulatory Visit: Payer: Self-pay | Admitting: *Deleted

## 2017-10-05 NOTE — Addendum Note (Signed)
Addended by: [name] D on: 10/05/2017 05:08 PM   Modules accepted: Orders

## 2017-10-05 NOTE — Patient Outreach (Signed)
[facility]) Care Management  10/05/2017  Raigan Baria [place] May 18, 1946 [id number]   RN Health Coach Monthly Outreach  Referral Date:01/12/2017 Referral Source:[facility] RN Case Manager Reason for Referral:CHF education Insurance:Medicare   Outreach Attempt:  Outreach attempt #1 to patient for monthly follow up.  Patient answered and requested call back later today.  Plan:   RN Health Coach will make another outreach attempt to patient within one business day if no return call back from patient.  [place] [phone] [email or web address]

## 2017-10-05 NOTE — Patient Outreach (Signed)
[name]) Care Management  [place]  10/05/2017   [name] 1946-01-11 [id number]   [place] Monthly Outreach   Referral Date: 01/12/2017 Referral Source: [facility] RN Case Manager Reason for Referral: CHF education Insurance: Medicare   Outreach Attempt:  Successful telephone outreach to patient for monthly follow up.  HIPAA verified with patient.  Patient stating she is doing well.  Reports she has been having trying to eat at least 3 meals a day.  States she still forgets sometimes to monitor her blood sugars prior to eating and then does not take her insulin.  Patient encouraged to monitor blood sugars before each meal so she can dose her sliding scale based on pre meal CBG's.  Reports this mornings fasting blood sugar was 179 with recent ranges of 150-200's.  Weight this morning was 169 pounds.  Denies any lower extremity swelling or shortness of breath.  Patient does report having a difficult time obtaining her mouth wash (Mucositis/Magic Mouth Wash) and reports the cost is about $179 a month.  Also reports at times her insulin pen co pays are expensive.  Discussed with patient [name] referral.  Patient declines [facility] Pharmacist assistance at this time.  She does request information concerning adopting her great grandson she has been caring for.  [facility] Social Work referral placed.  Encounter Medications:  Outpatient Encounter Medications as of 10/05/2017  Medication Sig Note  . amLODipine (NORVASC) 10 MG tablet TAKE 1/2 Tablet by mouth daily   . amoxicillin (AMOXIL) 500 MG capsule Take 2 g 1 h prior to dental appointment   . dapsone 25 MG tablet Take 25 mg by mouth daily.    . diphenhydrAMINE (BENADRYL) 50 MG capsule Take 1 capsule (50 mg total) by mouth every 6 (six) hours as needed for itching.   . furosemide (LASIX) 20 MG tablet Take 60 mg by mouth daily. 10/05/2017: Takes 1 tablet 3 times a day  . gabapentin (NEURONTIN) 300 MG capsule Take 1  capsule (300 mg total) by mouth 3 (three) times daily. 10/05/2017: Taking as needed  . glucose blood (ONETOUCH VERIO) test strip USE AS INSTRUCTED 4 TIMES A DAY (BEFORE MEALS) & AT BEDTIME (ICD 10-E11.9)   . HUMALOG KWIKPEN 100 UNIT/ML KiwkPen IF SUGAR:140-200 USE 8 UNITS,201-230 10 UNITS,231-260 12 UNITS,261-300 14 UNITS,>301 16 UNITS   . Insulin Pen Needle 31G X 5 MM MISC Use to administer insulin four times a day Dx E11.9   . levothyroxine (SYNTHROID, LEVOTHROID) 25 MCG tablet TAKE 2 TABLETS (50 MCG TOTAL) BY MOUTH DAILY.   . metoprolol tartrate (LOPRESSOR) 25 MG tablet TAKE 1 TABLET BY MOUTH TWICE A DAY   . Multiple Vitamins-Minerals (CENTRUM SILVER 50+WOMEN) TABS Take 1 tablet by mouth daily with breakfast.   . mycophenolate (MYFORTIC) 180 MG EC tablet Take 360 mg by mouth 2 (two) times daily.    [name] DELICA LANCETS 27O MISC Use to check blood sugars twice a day DX E11.9   . ONETOUCH VERIO test strip USE AS INSTRUCTED 4 TIMES A DAY (BEFORE MEALS) & AT BEDTIME (ICD 10-E11.9)   . oxyCODONE-acetaminophen (ROXICET) 5-325 MG tablet Take 0.5-1 tablets by mouth every 6 (six) hours as needed for severe pain.   Marland Kitchen tacrolimus (PROGRAF) 1 MG capsule Take 3 tablets by mouth twice daily   . warfarin (COUMADIN) 5 MG tablet TAKE 1 TO 1.5 TABLETS BY MOUTH DAILY AS DIRECTED BY COUMADIN CLINIC   . KLOR-CON M20 20 MEQ tablet  TAKE 2 TABLETS (40 MEQ TOTAL) BY MOUTH DAILY. (Patient not taking: Reported on 10/05/2017) 10/05/2017: Reports not taking   No facility-administered encounter medications on file as of 10/05/2017.     Functional Status:  In your present state of health, do you have any difficulty performing the following activities: 05/25/2017 01/21/2017  Hearing? N N  Vision? N N  Difficulty concentrating or making decisions? N N  Walking or climbing stairs? Y Y  Dressing or bathing? N N  Doing errands, shopping? N N  Preparing Food and eating ? N N  Using the Toilet? N N  In the past six months,  have you accidently leaked urine? Y Y  Do you have problems with loss of bowel control? N N  Managing your Medications? N N  Managing your Finances? N N  Housekeeping or managing your Housekeeping? N N  Some recent data might be hidden    Fall/Depression Screening: Fall Risk  08/01/2017 05/05/2017 02/22/2017  Falls in the past year? Yes Yes Yes  Comment 1 fall last year - No recent falls.  Number falls in past yr: 1 1 -  Comment no falls recently - -  Injury with Fall? No No -  Risk for fall due to : History of fall(s);Impaired balance/gait;Impaired mobility History of fall(s);Impaired balance/gait;Impaired mobility -  Follow up Education provided;Falls prevention discussed;Falls evaluation completed Falls evaluation completed;Falls prevention discussed;Education provided -   PHQ 2/9 Scores 05/05/2017 01/21/2017 01/12/2017 11/21/2015 11/11/2014 06/04/2013  PHQ - 2 Score 0 0 0 0 0 0    THN CM Care Plan Problem One     Most Recent Value  Care Plan Problem One  Knowledge deficiet related to self care management of diabetes  Role Documenting the Problem One  Gonzalez for Problem One  Active  THN Long Term Goal   Patient will report a decrease in her Hgb A1C by 0.5 points in the next 90 days  THN Long Term Goal Start Date  10/05/17  [facility] Long Term Goal Met Date  10/05/17  Interventions for Problem One Long Term Goal  Current care plan reviewed and discussed with patient, encouraged patient to discuss medication options with physician, encouraged and discussed medication compliance, encouraged patient to monitor blood sugars prior to eating to give appropriate dose of sliding scale insulin, discussed healthier meal options, encouraged patient to eat at least 3 meals a day and bedtime snack, offered patient assistance with affording medications(patient refusing assistance at this time),    THN CM Short Term Goal #1   Patient will report taking sliding scale insulin with meals 3 times a  day if appropriate range in sliding scale in the next 30 days.  THN CM Short Term Goal #1 Start Date  10/05/17  [facility] CM Short Term Goal #1 Met Date  10/05/17  Interventions for Short Term Goal #1  encouraged patient to take blood sugars prior to eating, discussed and reviewed sliding scale prescibed with patient, encouraged medication compliance, encouraged patient to set a routine of planning meal times and keeping CBG/Insulin supplies nearby where she eats meals, encouraged patient to keep snacks nearby for hypoglycemic events  THN CM Short Term Goal #2   Patient will report monitoring her blood sugars at least 3 times a day prior to eating in the next 30 days  THN CM Short Term Goal #2 Start Date  10/05/17  Interventions for Short Term Goal #2  reviewed sliding scale prescribed with patient, educated  patient on the need to monitor blood sugars prior to eating and how to follow sliding scale, encouraged patient to develop an eating time routine and to monitor blood sugars prior to meals, encouraged patient to eat at least 3 meals a day with bedtime snack, encouraged medication compliance, discussed with patient setting alarms for monitoring blood sugars prior to eating times     Appointments:  Patient last saw Dr. Alain Marion on 09/02/2017 and has scheduled follow up appointment on 11/30/2017.  Plan: RN Health Coach will place [name] Social Work referral for community resources for assistance with adoption of her great grandchild. RN Health Coach will send primary care provider Quarterly Update. RN Health Coach will make next monthly telephone outreach in the month of April.  [place] [phone] [email or web address]

## 2017-10-06 ENCOUNTER — Encounter: Payer: Self-pay | Admitting: *Deleted

## 2017-10-06 ENCOUNTER — Other Ambulatory Visit: Payer: Self-pay | Admitting: *Deleted

## 2017-10-06 NOTE — Patient Outreach (Signed)
[facility]) Care Management  10/06/2017  [name] February 17, 1946 [id number]   CSW was able to make initial contact with patient today to perform phone assessment, as well as assess and assist with social work needs and services.  CSW introduced self, explained role and types of services provided through [place] [facility] ([place] [facility]).  CSW further explained to patient that CSW works with patient's Telephonic RNCM, also with [place] [facility], [name]. CSW then explained the reason for the call, indicating that Mrs. Tarpley thought that patient would benefit from social work services and resources to assist with providing information and resources for adopting her great grandson.  CSW obtained two HIPAA compliant identifiers from patient, which included patient's name and date of birth. Patient admits that she is very concerned about the custody of her 74-year-old great grandson, currently residing with her and her husband, [name].  Patient reported that her granddaughter, the boys mother, has been incarcerated ever since the boy was born, but is now threatening to come and take the boy when she gets released from prison.  Patient reported, "She is not a fit mother and has never even laid eyes on the baby".  Patient is wanting to know what her rights are as the boys caregiver and how to start the adoption process, if necessary. CSW explained to patient that, unlike a child's parent, grandparents do not have automatic custody rights in the [place].  However, patient is able to petition the court system for legal and physical custody of the child.  CSW further explained that patient's legal options may include legal guardianship and/or foster care.   Foster care is when the grandparents care for the child, but a foster care agency retains legal custody.  Foster care is usually only temporary until the child is placed into a more  permanent home.  Legal Guardianship is established when a court order or living will gives the grandparent the authority to care for the child.  This would allow the biological mother to still have some rights, such as the ability to visit the child.  Legal Guardianship is more permanent than a custody order.   Adoption requires the termination of the parent-child relationship between the child and his or her biological parents.  In this case, the grandparents would become the child's legal parents.  However, it does not sound as though the mother is willing to relinquish full custody of the child, nor is patient sure that they would be able to contact the child's biological father to obtain legal consent.  Patient just wants to ensure that the mother cannot come and take the child away from them when she is released from prison.  CSW also spoke with patient about a referral to Child Protective Services, through the [place], if she is worried about the safety and well-being of the child. CSW explained all the different scenarios to patient in great detail, also agreeing to print information and mail to patient's home for her review.  CSW agreed to follow-up with patient next week to ensure that she received the information, as well as answer any questions she may have at that time.  Patient was most appreciative of the call and information provided.  CSW provided patient with CSW's contact information, encouraging patient to contact CSW at any time with regards to questions and/or concerns.   [facility] CM Care Plan Problem One  Most Recent Value  Care Plan Problem One  Knowledge deficit with regards to how to adopt great grandson  Role Documenting the Problem One  Clinical Social Worker  Care Plan for Problem One  Active  [facility] CM Short Term Goal #1   Patient will review information provided to her over the phone regarding the adoption process, within the next week.  THN CM  Short Term Goal #1 Start Date  10/06/17  Interventions for Short Term Goal #1  CSW provided patient with detailed information over the phone regarding adoption.  THN CM Short Term Goal #2   Patient will review information printed and mailed to her home regarding the adoption process, within the next two weeks.  THN CM Short Term Goal #2 Start Date  10/06/17  Interventions for Short Term Goal #2  CSW provided patient with detailed printed information and mailed to patient's home for her review.    Nat Christen, BSW, MSW, LCSW  Licensed Education officer, environmental Health System  Mailing [place] [address] [place], [place] [address] Physical Address-[address], [place], [place] [address] Toll Free Main # [phone] Fax # [phone] Cell # [phone]  Office # [phone] [email or web address]

## 2017-10-07 ENCOUNTER — Other Ambulatory Visit: Payer: Self-pay | Admitting: Thoracic Surgery (Cardiothoracic Vascular Surgery)

## 2017-10-07 ENCOUNTER — Ambulatory Visit: Payer: Self-pay | Admitting: *Deleted

## 2017-10-07 DIAGNOSIS — R918 Other nonspecific abnormal finding of lung field: Secondary | ICD-10-CM

## 2017-10-07 NOTE — Patient Outreach (Signed)
Request received from Cephas Darby, LCSW          to mail patient personal care resources.  Information mailed today.

## 2017-10-10 ENCOUNTER — Encounter: Payer: Self-pay | Admitting: Physician Assistant

## 2017-10-11 ENCOUNTER — Ambulatory Visit
Admission: RE | Admit: 2017-10-11 | Discharge: 2017-10-11 | Disposition: A | Discharge status: Home / Self Care | Payer: Medicare Other | Source: Ambulatory Visit | Attending: Thoracic Surgery (Cardiothoracic Vascular Surgery) | Admitting: Thoracic Surgery (Cardiothoracic Vascular Surgery)

## 2017-10-11 DIAGNOSIS — R918 Other nonspecific abnormal finding of lung field: Secondary | ICD-10-CM

## 2017-10-11 NOTE — Patient Outreach (Signed)
Request received from Joanna Saporito, LCSW to mail patient personal care resources.  Information mailed today. 

## 2017-10-12 ENCOUNTER — Telehealth: Payer: Self-pay | Admitting: Internal Medicine

## 2017-10-12 ENCOUNTER — Other Ambulatory Visit: Payer: Self-pay | Admitting: *Deleted

## 2017-10-12 ENCOUNTER — Other Ambulatory Visit: Payer: Self-pay | Admitting: Internal Medicine

## 2017-10-12 DIAGNOSIS — R911 Solitary pulmonary nodule: Secondary | ICD-10-CM

## 2017-10-12 MED ORDER — ONETOUCH DELICA LANCETS 33G MISC: 5 refills | Status: AC

## 2017-10-12 MED ORDER — GLUCOSE BLOOD VI STRP: 1.0000 | ORAL_STRIP | Freq: Three times a day (TID) | 5 refills | Status: DC | PRN

## 2017-10-12 NOTE — Telephone Encounter (Signed)
Reviewed chart pt is up-to-date sent refills to POF.Marland Kitchen/LMB

## 2017-10-12 NOTE — Telephone Encounter (Signed)
Copied from [place]. Topic: Quick Communication - Rx Refill/Question >> Oct 12, 2017  9:32 AM Cleaster Corin, NT wrote: [facility]. Needed to say 3x a day for [facility] VERIO test strip [[id number]] per insurance  Pt. Also needing refill on ONETOUCH DELICA LANCETS 32D MISC [[id number]]   [id number] [place], [place] [address] [place] [address] Phone: [phone] Fax: [phone]

## 2017-10-14 ENCOUNTER — Other Ambulatory Visit: Payer: Self-pay | Admitting: *Deleted

## 2017-10-14 ENCOUNTER — Ambulatory Visit (INDEPENDENT_AMBULATORY_CARE_PROVIDER_SITE_OTHER): Payer: Medicare Other | Admitting: Pharmacist

## 2017-10-14 DIAGNOSIS — Z5181 Encounter for therapeutic drug level monitoring: Secondary | ICD-10-CM | POA: Diagnosis not present

## 2017-10-14 DIAGNOSIS — E119 Type 2 diabetes mellitus without complications: Secondary | ICD-10-CM | POA: Diagnosis not present

## 2017-10-14 DIAGNOSIS — I4891 Unspecified atrial fibrillation: Secondary | ICD-10-CM

## 2017-10-14 DIAGNOSIS — Z940 Kidney transplant status: Secondary | ICD-10-CM | POA: Diagnosis not present

## 2017-10-14 DIAGNOSIS — N2581 Secondary hyperparathyroidism of renal origin: Secondary | ICD-10-CM | POA: Diagnosis not present

## 2017-10-14 LAB — POCT INR: INR: 2.7

## 2017-10-14 NOTE — Patient Outreach (Signed)
[facility]) Care Management  10/14/2017  Margit Batte Borboa 1946/03/03 [id number]   CSW made an attempt to try and contact patient today to follow-up regarding social work services and resources, as well as ensure that patient received the packet of resource information mailed to her home; however, patient was unavailable at the time of CSW's call.  A HIPAA compliant message was left for patient on voicemail and CSW is currently awaiting a return call.  CSW will make a second outreach attempt on Monday, October 17, 2017, if a return call is not received from patient in the meantime. Nat Christen, BSW, MSW, LCSW  Licensed Education officer, environmental Health System  Mailing [place] [address], [place], [place] [address] Physical Address-[address], [place], [place] [address] Toll Free Main # [phone] Fax # [phone] Cell # [phone]  Office # [phone] [email or web address]

## 2017-10-15 ENCOUNTER — Other Ambulatory Visit: Payer: Self-pay | Admitting: Cardiology

## 2017-10-17 ENCOUNTER — Other Ambulatory Visit: Payer: Self-pay | Admitting: *Deleted

## 2017-10-17 NOTE — Patient Outreach (Signed)
[facility] [facility]) Care Management  10/17/2017  Shiesha Jahn Costen May 21, 1946 [id number]   CSW was able to make contact with patient today to follow-up regarding social work services and resources, as well as to ensure that patient received the packet of resource information mailed to her home by the Care Management Assistant, Kennyth Lose, also with [facility].  Patient admits to receiving the packet of resource information and has been reviewing the printed material regarding adoption.  Patient and CSW were able to review some of the printed material over the phone so that CSW could answer specific questions.  Patient admits that she was not aware that the adoption process would be so cumbersome.  Patient reported that she needed a lot more time to think about what her options are and what she plans to do with regards to obtaining guardianship of her great grandchild.  Patient was most appreciative of all the information provided to her, as well as the discussions held over the phone.  CSW was able to ensure that patient has the correct contact information for CSW, encouraging her to contact CSW directly if additional social work needs arise in the near future.  Patient voiced understanding and was agreeable to this plan. CSW will perform a case closure on patient, as all goals of treatment have been met from social work standpoint and no additional social work needs have been identified at this time.  CSW will notify patient's Telephonic RNCM with [place] [facility], [name] of CSW's plans to close patient's case.  CSW will fax an update to patient's Primary Care Physician, Dr. Tyrone Apple Plotnikov to ensure that they are aware of CSW's involvement with patient's plan of care.  CSW will submit a case closure request to Mrs. Tarpley, in the form of an [employer].  Patient is aware that she will continue to be followed by Mrs. Tarpley  for community case management services.    [facility] CM Care Plan Problem One     Most Recent Value  Care Plan Problem One  Knowledge deficit with regards to how to adopt great grandson  Role Documenting the Problem One  Clinical Social Worker  Care Plan for Problem One  Active  [facility] CM Short Term Goal #1   Patient will review information provided to her over the phone regarding the adoption process, within the next week.  THN CM Short Term Goal #1 Start Date  10/06/17  [facility] CM Short Term Goal #1 Met Date  10/17/17  Interventions for Short Term Goal #1  Patient reports having reviewed information provided to her via phone conversation.  THN CM Short Term Goal #2   Patient will review information printed and mailed to her home regarding the adoption process, within the next two weeks.  THN CM Short Term Goal #2 Start Date  10/06/17  [facility] CM Short Term Goal #2 Met Date  10/17/17  Interventions for Short Term Goal #2  Patient admits receiving resource information mailed to her by CSW and familiarizing herself with the adoption process.    Nat Christen, BSW, MSW, LCSW  Licensed Education officer, environmental Health System  Mailing [place] [address], [place], [place] [address] Physical Address-[address], [place], [place] [address] Toll Free Main # [phone] Fax # [phone] Cell # [phone]  Office # [phone] [email or web address]

## 2017-10-26 ENCOUNTER — Ambulatory Visit: Payer: Medicare Other | Admitting: Physician Assistant

## 2017-10-31 DIAGNOSIS — R809 Proteinuria, unspecified: Secondary | ICD-10-CM | POA: Diagnosis not present

## 2017-10-31 DIAGNOSIS — I10 Essential (primary) hypertension: Secondary | ICD-10-CM | POA: Diagnosis not present

## 2017-10-31 DIAGNOSIS — R600 Localized edema: Secondary | ICD-10-CM | POA: Diagnosis not present

## 2017-10-31 DIAGNOSIS — E119 Type 2 diabetes mellitus without complications: Secondary | ICD-10-CM | POA: Diagnosis not present

## 2017-10-31 DIAGNOSIS — D899 Disorder involving the immune mechanism, unspecified: Secondary | ICD-10-CM | POA: Diagnosis not present

## 2017-10-31 DIAGNOSIS — Z940 Kidney transplant status: Secondary | ICD-10-CM | POA: Diagnosis not present

## 2017-11-03 ENCOUNTER — Encounter (HOSPITAL_COMMUNITY)
Admission: RE | Admit: 2017-11-03 | Discharge: 2017-11-03 | Disposition: A | Discharge status: Home / Self Care | Payer: Medicare Other | Source: Ambulatory Visit | Attending: Thoracic Surgery (Cardiothoracic Vascular Surgery) | Admitting: Thoracic Surgery (Cardiothoracic Vascular Surgery)

## 2017-11-03 DIAGNOSIS — R911 Solitary pulmonary nodule: Secondary | ICD-10-CM | POA: Insufficient documentation

## 2017-11-03 DIAGNOSIS — R918 Other nonspecific abnormal finding of lung field: Secondary | ICD-10-CM | POA: Diagnosis not present

## 2017-11-03 LAB — GLUCOSE, CAPILLARY: GLUCOSE-CAPILLARY: 245 mg/dL — AB (ref 65–99)

## 2017-11-03 MED ORDER — FLUDEOXYGLUCOSE F - 18 (FDG) INJECTION
8.6000 | Freq: Once | INTRAVENOUS | Status: AC | PRN
  Administered 2017-11-03: 8.6 via INTRAVENOUS

## 2017-11-04 ENCOUNTER — Other Ambulatory Visit: Payer: Self-pay | Admitting: *Deleted

## 2017-11-04 NOTE — Patient Outreach (Signed)
[facility]) Care Management  11/04/2017  [name] 01-17-1946 [id number]   RN Health Coach Monthly Outreach  Referral Date:01/12/2017 Referral Source:[facility]N RN Case Manager Reason for Referral:CHF education Insurance:Medicare   Outreach Attempt: Outreach attempt #1 to patient for monthly follow up.  Patient answered and stated she was in the process of getting her great grandson dressed.  Requested a call back next week.  Plan:   RN Health Coach will send unsuccessful outreach letter to patient.  RN Health Coach will make another outreach attempt to patient within 3-4 business days per patient request.  [name] RN [place] [phone] [name]

## 2017-11-07 ENCOUNTER — Other Ambulatory Visit: Payer: Self-pay

## 2017-11-07 ENCOUNTER — Ambulatory Visit (INDEPENDENT_AMBULATORY_CARE_PROVIDER_SITE_OTHER): Payer: Medicare Other | Admitting: Thoracic Surgery (Cardiothoracic Vascular Surgery)

## 2017-11-07 ENCOUNTER — Other Ambulatory Visit: Payer: Self-pay | Admitting: Internal Medicine

## 2017-11-07 ENCOUNTER — Encounter: Payer: Self-pay | Admitting: Thoracic Surgery (Cardiothoracic Vascular Surgery)

## 2017-11-07 VITALS — BP 134/70 | HR 83 | Resp 18 | Ht 61.0 in | Wt 172.0 lb

## 2017-11-07 DIAGNOSIS — Z953 Presence of xenogenic heart valve: Secondary | ICD-10-CM | POA: Diagnosis not present

## 2017-11-07 DIAGNOSIS — R918 Other nonspecific abnormal finding of lung field: Secondary | ICD-10-CM | POA: Diagnosis not present

## 2017-11-07 DIAGNOSIS — R911 Solitary pulmonary nodule: Secondary | ICD-10-CM | POA: Diagnosis not present

## 2017-11-07 DIAGNOSIS — I2510 Atherosclerotic heart disease of native coronary artery without angina pectoris: Secondary | ICD-10-CM

## 2017-11-07 DIAGNOSIS — N183 Chronic kidney disease, stage 3 unspecified: Secondary | ICD-10-CM

## 2017-11-07 NOTE — Patient Instructions (Addendum)
Continue all previous medications without any changes at this time  Schedule f/u appointment with Dr [name] ASAP

## 2017-11-07 NOTE — Progress Notes (Signed)
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  The patient was last seen here in our office on November 10, 2015 at which time the small groundglass opacities in the right lung appeared stable.  Follow-up imaging in 2 years was recommended at that time.  The patient recently underwent follow-up CT which revealed a new suspicious nodule in the right upper lobe.  PET CT scan was performed and the patient returns to our office today for follow-up.  Patient states that she has been having progressive problems with exertional shortness of breath, orthopnea, and lower extremity edema.  She was hospitalized last summer with acute exacerbation of chronic diastolic congestive heart failure.  She has not been hospitalized since, but she complains that she gets short of breath with low-level activity and occasionally at rest.  Her energy level is down.  She has not had significant chest pain or chest tightness.  She does occasionally get pain radiating down her left arm with exertion.  She has not had fevers, chills, or productive cough.  The patient states that  her dose of Lasix was recently doubled by her primary care physician.  She has not appreciated any significant change.  The remainder of her review of systems is noncontributory.  Current Outpatient Medications  Medication Sig Dispense Refill  . amLODipine (NORVASC) 10 MG tablet TAKE 1/2 Tablet by mouth daily    . amoxicillin (AMOXIL) 500 MG capsule TAKE 4 CAPSULES BY MOUTH 1 HOUR PRIOR TO DENTAL APPOINTMENT 12 capsule 1  . dapsone 25 MG tablet Take 25 mg by mouth daily.     . diphenhydrAMINE (BENADRYL) 50 MG capsule Take 1 capsule (50 mg total) by mouth every 6 (six) hours as needed for itching. 90 capsule 3  . furosemide (LASIX) 20 MG tablet Take 60 mg by mouth 2 (two) times daily.   10  . gabapentin (NEURONTIN) 300 MG capsule Take 1 capsule (300 mg total) by mouth 3 (three) times daily. 90 capsule 5  . glucose blood (ONETOUCH VERIO) test strip USE AS INSTRUCTED 4 TIMES A DAY (BEFORE MEALS) & AT BEDTIME (ICD 10-E11.9) 150 each 3  . HUMALOG KWIKPEN 100 UNIT/ML KiwkPen IF SUGAR:140-200 USE 8 UNITS,201-230 10 UNITS,231-260 12 UNITS,261-300 14 UNITS,>301 16 UNITS 27 pen 3  . Insulin Pen Needle 31G X 5 MM MISC Use to administer insulin four times a day Dx E11.9 130 each 5  . levothyroxine (SYNTHROID, LEVOTHROID) 25 MCG tablet TAKE 2 TABLETS (50 MCG TOTAL) BY MOUTH DAILY. 60 tablet 2  . lisinopril (PRINIVIL,ZESTRIL) 10 MG tablet Take 5 mg by mouth at bedtime.     . metoprolol tartrate (LOPRESSOR) 25 MG tablet  TAKE 1 TABLET BY MOUTH TWICE A DAY 180 tablet 1  . Multiple Vitamins-Minerals (CENTRUM SILVER 50+WOMEN) TABS Take 1 tablet by mouth daily with breakfast.    . mycophenolate (MYFORTIC) 180 MG EC tablet Take 360 mg by mouth 2 (two) times daily.     [name] DELICA LANCETS 08Q MISC Use to check blood sugars three times a day DX E11.9 100 each 5  . oxyCODONE-acetaminophen (ROXICET) 5-325 MG tablet Take 0.5-1 tablets by mouth every 6 (six) hours as needed for severe pain. 100 tablet 0  . tacrolimus  (PROGRAF) 1 MG capsule Take 3 tablets by mouth twice daily    . warfarin (COUMADIN) 5 MG tablet TAKE 1 TO 1.5 TABLETS BY MOUTH DAILY AS DIRECTED BY COUMADIN CLINIC 120 tablet 0  . glucose blood (ONETOUCH VERIO) test strip 1 each by Other route 3 (three) times daily as needed for other. Dx E11.9 150 each 5  . KLOR-CON M20 20 MEQ tablet TAKE 2 TABLETS (40 MEQ TOTAL) BY MOUTH DAILY. (Patient not taking: Reported on 10/05/2017) 60 tablet 5   No current facility-administered medications for this visit.       Physical Exam:   BP 134/70 (BP Location: Right Arm, Patient Position: Sitting, Cuff Size: Large)   Pulse 83   Resp 18   Ht 5\' 1"  (1.549 m)   Wt 172 lb (78 kg)   SpO2 94% Comment: ON RA  BMI 32.50 kg/m   General:  Moderately obese female in no distress  Chest:   Diminished at lung bases, no wheezes or rales  CV:   Regular rate and rhythm without murmur  Incisions:  n/a  Abdomen:  Soft nontender  Extremities:  Warm and well perfused with moderate right lower extremity edema and mild left lower extremity edema  Diagnostic Tests:  CT CHEST WITHOUT CONTRAST  TECHNIQUE: Multidetector CT imaging of the chest was performed following the standard protocol without IV contrast.  COMPARISON:  Radiographs 01/07/2017.  CT 11/10/2015 and 11/11/2014.  FINDINGS: Cardiovascular: Extensive atherosclerosis of the aorta, great vessels and coronary arteries status post median sternotomy, aortic valve replacement and probable CABG. The heart is enlarged. There is no pericardial effusion.  Mediastinum/Nodes: There are no enlarged mediastinal, hilar or axillary lymph nodes.Hilar assessment is limited by the lack of intravenous contrast, although the hilar contours appear unchanged. Stable partially calcified right thyroid nodule, unlikely to be clinically significant. The trachea and esophagus appear unremarkable.  Lungs/Pleura: New dependent small right-greater-than-left  pleural effusions. There is increased background generalized ground-glass density throughout the lungs with a mosaic attenuation, likely due to edema or small airways disease. The previously demonstrated dominant right upper lobe ground-glass density has enlarged and become more solid, now measuring 14 x 13 mm on image 31/5. Previously 10 x 9 mm. The smaller ground-glass density more posteriorly has only minimally enlarged, measuring 8 x 7 mm on image 51/5 (previously 6 x 5 mm). This demonstrates no significant solid components. 6 mm solid nodule in the superior segment of the left lower lobe has mildly enlarged (image 65/5). No other significant changes. A small right upper lobe nodule on image 42 and scarring at the right lung base and lingula are stable.  Upper abdomen: The visualized upper abdomen appears stable without suspicious findings. There is a stable right renal cyst, right renal cortical thinning, evidence of previous left nephrectomy, a small right adrenal adenoma and diffuse vascular calcifications.  Musculoskeletal/Chest wall: There is no chest wall mass or suspicious osseous  finding. Degenerative changes throughout the thoracic spine, greatest at T7-8.  IMPRESSION: 1. Enlarging and increasingly solid part solid right upper lobe lesion compared with prior study from 2017, suspicious for adenocarcinoma. Consider PET-CT or tissue sampling 2. Smaller ground-glass density more posteriorly in the right upper lobe has only slightly enlarged. 3. Small solid nodule in the superior segment of the left lower lobe has also mildly enlarged. 4. No adenopathy. 5. Small bilateral pleural effusions. Background diffuse ground-glass density with mosaic attenuation, probably edema or small airways disease. 6. Cardiomegaly with severe atherosclerosis post median sternotomy.   Electronically Signed   By: Richardean Sale M.D.   On: 10/11/2017 15:47   NUCLEAR MEDICINE PET  SKULL BASE TO THIGH  TECHNIQUE: 8.6 mCi F-18 FDG was injected intravenously. Full-ring PET imaging was performed from the skull base to thigh after the radiotracer. CT data was obtained and used for attenuation correction and anatomic localization.  Fasting blood glucose: 245 mg/dl  COMPARISON:  Chest CT on 10/11/2017 and 11/10/2015  FINDINGS: Mediastinal blood pool activity: SUV max = 2.8  NECK:  No hypermetabolic lymph nodes or masses.  Incidental CT findings:  None.  CHEST: No hypermetabolic lymphadenopathy. 14 mm sub-solid pulmonary nodule in the right upper lobe shows low-grade FDG activity with SUV max of 1.8.  8 mm ground-glass nodule in the posterior right upper lobe on image 21/8 shows no FDG uptake, but is too small to characterize by PET. 6 mm pulmonary nodule in the posterior left lower lobe on image 27/8 shows no FDG uptake, but is also too small to characterize by PET.  Incidental CT findings: Aortic and coronary artery atherosclerosis. Stable moderate cardiomegaly. Previous aortic valve replacement. Stable tiny bilateral pleural effusions. Mild diffuse ground-glass opacity with mosaic attenuation, likely due to mild edema or small airways disease.  ABDOMEN/PELVIS: No abnormal hypermetabolic activity within the liver, pancreas, adrenal glands, or spleen. No hypermetabolic lymph nodes in the abdomen or pelvis.  Incidental CT findings: Diffuse right renal atrophy and small renal cysts noted. Native left kidney not visualized. Renal transplant seen in left iliac fossa. No evidence of hydronephrosis. Nonobstructing calculi are noted in the native right kidney and left renal transplant. Aortic atherosclerosis. Small paraumbilical hernia containing only fat. Diverticulosis seen mainly involving the sigmoid colon, however there is no evidence of diverticulitis.  SKELETON: No focal hypermetabolic bone lesions to suggest  skeletal metastasis.  Incidental CT findings:  None.  IMPRESSION: 14 mm sub-solid pulmonary nodule shows low-grade FDG activity, suspicious for low-grade adenocarcinoma.  Sub-cm right upper lobe and left lower lobe pulmonary nodules show no FDG uptake, but are too small to characterize by PET.  No evidence of metastatic disease.   Electronically Signed   By: Earle Gell M.D.   On: 11/03/2017 11:18   Impression:  Patient has a new 1.4 cm nodular density in the right upper lobe associated with mildly increased FDG uptake on PET imaging, suspicious for possible low-grade adenocarcinoma.  All other lung nodules remain stable or have resolved and there are no other areas on PET imaging that appears suspicious.  Coincidentally the patient has had gradual progression of symptoms of exertional shortness of breath, fatigue, orthopnea, and lower extremity edema consistent with worsening chronic diastolic congestive heart failure.  She may be having angina.  Her functional status is marginal.  I question that she would tolerate surgery for wedge resection.  Plan:  I have discussed the results of the patient's recent chest CT scan and PET scan  at length with the patient in the office today.  We discussed treatment options including proceeding directly to surgery for wedge resection for definitive diagnosis and possible cure.  We discussed my concerns regarding whether or not she would tolerate such an operation, and she made it clear that she has not interested in going through surgery of any kind.  As an alternative we discussed the possibility of having CT-guided transthoracic needle biopsy of her right lung lesion.  If biopsy is positive for malignancy she might be a good candidate for SBRT.  She understands that a negative biopsy does not necessarily prove that the lung nodule is benign.  She understands that there is a small but significant risk of iatrogenic pneumothorax associated with  needle biopsy.  In the meanwhile, I have urged the patient to schedule follow-up appointment with her cardiologist as soon as practical to address her worsening symptoms of congestive heart failure and the possibility of ongoing angina pectoris.  The patient will return to our office to review the results of her needle biopsy in 2-3 weeks.  All questions answered.  I spent in excess of 15 minutes during the conduct of this office consultation and >50% of this time involved direct face-to-face encounter with the patient for counseling and/or coordination of their care.    Valentina Gu. Roxy Manns, MD 11/07/2017 4:31 PM

## 2017-11-08 ENCOUNTER — Telehealth: Payer: Self-pay | Admitting: Cardiology

## 2017-11-08 ENCOUNTER — Other Ambulatory Visit: Payer: Self-pay | Admitting: *Deleted

## 2017-11-08 ENCOUNTER — Encounter: Payer: Self-pay | Admitting: *Deleted

## 2017-11-08 DIAGNOSIS — R918 Other nonspecific abnormal finding of lung field: Secondary | ICD-10-CM

## 2017-11-08 NOTE — Telephone Encounter (Signed)
Received records from [place] on 11/08/17, Appt 11/14/17 @ 12:00 PM.NV

## 2017-11-08 NOTE — Patient Outreach (Signed)
[name]) Care Management  11/08/2017  [name] 10-11-1945 [id number]  RN Health Coach Monthly Outreach  Referral Date:01/12/2017 Referral Source:[facility] RN Case Manager Reason for Referral:CHF education Insurance:Medicare   Outreach Attempt:  Successful telephone outreach to patient for monthly follow up.  HIPAA verified with patient.  Patient reports being a little sad related to her appointment with CT Surgeon on yesterday and diagnosis of lung mass needing biopsy.  States she is awaiting them to schedule her for a CT biopsy of her lung.  Reports right lower extremity edema and left upper extremity edema and shortness of breath with anxiety.  States her edema resolves while sleeping at night and gets worse as the day progresses.  Discussed with patient elevation of her extremities.  Patient reports her Nephrologist, Dr. Joelyn Oms increased her lasix dose about 2 weeks ago and she can tell the increase with urination.  Reports weight is up to 174 pounds yesterday.  Encouraged patient to contact her Cardiologist or Nephrologist for continued increase in weight and extremity edema.  Patient verbalizing she has not eaten breakfast at 2:30 pm and has not checked her blood sugar this morning as of yet.  States she felt hunger about 30 minutes ago and ate a small piece of banana bread.  Patient obtained blood sugar while on the telephone.  CBG is 364.  Patient stated she was taking her insulin at that moment.  Discussed with patient healthier meal and snack options and healthy eating times.  Encouraged patient to establish daily routine with eating breakfast, lunch and dinner at decent hours of the day.  Suggested patient speak with her primary care provider about the [facility] blood glucose monitoring system to help with blood sugar monitoring compliance and reduction in Hgb A1C.  Appointments:  Patient states she last saw her primary care provider, Dr. Alain Marion on  2/15/219 and has scheduled follow up appointment on 5/15/219.  Scheduled appointment with Cardiologist on 11/14/2017.  Patient encouraged to keep and attend medical appointments.  Plan: RN Health Coach will make next monthly outreach to patient in the month of May.  [place] [phone] [name]

## 2017-11-09 ENCOUNTER — Other Ambulatory Visit: Payer: Self-pay | Admitting: Endocrinology

## 2017-11-11 ENCOUNTER — Other Ambulatory Visit: Payer: Self-pay | Admitting: Internal Medicine

## 2017-11-14 ENCOUNTER — Ambulatory Visit (INDEPENDENT_AMBULATORY_CARE_PROVIDER_SITE_OTHER): Payer: Medicare Other | Admitting: Cardiology

## 2017-11-14 ENCOUNTER — Encounter: Payer: Self-pay | Admitting: Cardiology

## 2017-11-14 ENCOUNTER — Ambulatory Visit: Payer: Medicare Other | Admitting: Thoracic Surgery (Cardiothoracic Vascular Surgery)

## 2017-11-14 ENCOUNTER — Ambulatory Visit (INDEPENDENT_AMBULATORY_CARE_PROVIDER_SITE_OTHER): Payer: Medicare Other | Admitting: Pharmacist Clinician (PhC)/ Clinical Pharmacy Specialist

## 2017-11-14 VITALS — BP 116/66 | HR 65 | Ht 61.0 in | Wt 166.4 lb

## 2017-11-14 DIAGNOSIS — Z79899 Other long term (current) drug therapy: Secondary | ICD-10-CM

## 2017-11-14 DIAGNOSIS — Z952 Presence of prosthetic heart valve: Secondary | ICD-10-CM

## 2017-11-14 DIAGNOSIS — I2510 Atherosclerotic heart disease of native coronary artery without angina pectoris: Secondary | ICD-10-CM | POA: Diagnosis not present

## 2017-11-14 DIAGNOSIS — N2581 Secondary hyperparathyroidism of renal origin: Secondary | ICD-10-CM | POA: Diagnosis not present

## 2017-11-14 DIAGNOSIS — Z5181 Encounter for therapeutic drug level monitoring: Secondary | ICD-10-CM

## 2017-11-14 DIAGNOSIS — R0602 Shortness of breath: Secondary | ICD-10-CM

## 2017-11-14 DIAGNOSIS — I4819 Other persistent atrial fibrillation: Secondary | ICD-10-CM

## 2017-11-14 DIAGNOSIS — E785 Hyperlipidemia, unspecified: Secondary | ICD-10-CM

## 2017-11-14 DIAGNOSIS — I481 Persistent atrial fibrillation: Secondary | ICD-10-CM

## 2017-11-14 DIAGNOSIS — R072 Precordial pain: Secondary | ICD-10-CM | POA: Diagnosis not present

## 2017-11-14 DIAGNOSIS — I4891 Unspecified atrial fibrillation: Secondary | ICD-10-CM

## 2017-11-14 DIAGNOSIS — Z940 Kidney transplant status: Secondary | ICD-10-CM | POA: Diagnosis not present

## 2017-11-14 DIAGNOSIS — R600 Localized edema: Secondary | ICD-10-CM | POA: Diagnosis not present

## 2017-11-14 DIAGNOSIS — R809 Proteinuria, unspecified: Secondary | ICD-10-CM | POA: Diagnosis not present

## 2017-11-14 LAB — POCT INR: INR: 1.7

## 2017-11-14 MED ORDER — ROSUVASTATIN CALCIUM 20 MG PO TABS: 20.0000 mg | ORAL_TABLET | Freq: Every day | ORAL | 11 refills | Status: DC

## 2017-11-14 NOTE — Patient Instructions (Signed)
Medication Instructions: Dr [name] has recommended making the following medication changes: 1. START Rosuvastatin (Crestor) 20 mg - take 1 tablet daily  Labwork: Your physician recommends that you return for lab work in 74 weeks - FASTING.  Testing/Procedures: 1. Echocardiogram - Your physician has requested that you have an echocardiogram. Echocardiography is a painless test that uses sound waves to create images of your heart. It provides your doctor with information about the size and shape of your heart and how well your heart's chambers and valves are working. This procedure takes approximately one hour. There are no restrictions for this procedure.  2. Lexiscan Myoview stress test - Your physician has requested that you have a lexiscan myoview. For further information please visit [email or web address]. Please follow instruction sheet, as given.  >>These tests will be performed at our [facility] location [place], [place] [address] [phone]  Follow-up: Dr [name] recommends that you schedule a follow-up appointment in 6 months. You will receive a reminder letter in the mail two months in advance. If you don't receive a letter, please call our office to schedule the follow-up appointment.  If you need a refill on your cardiac medications before your next appointment, please call your pharmacy.  If you need a refill on your cardiac medications before your next appointment, please call your pharmacy.

## 2017-11-14 NOTE — Progress Notes (Signed)
HPI: FU aortic stenosis s/p AVR and atrial fibrillation. Patient has a history of embolic CVA. She has a history of coronary artery disease, status post PCI of the right coronary artery. She underwent cardiac catheterization in September 2014 for severe AS. The right coronary was totally occluded but no other coronary disease noted.  Patient had aortic valve replacement with pericardial tissue valve and also had Maze procedure.  Echo 6/18 showed normal LV function, moderate AS (mean gradient 24 mmHg), mild MR, moderate LAE. Carotid Dopplers September 2018 showed 1 to 39% bilateral stenosis.  Abdominal ultrasound October 2018 showed no abdominal aortic aneurysm and no focal stenosis in the aorta or iliacs; abnormal ABIs; PVD followed by Dr Fletcher Anon.  Chest CT March 2019 showed enlarging right upper lobe nodule suspicious for adenocarcinoma.  There was small bilateral pleural effusions.  PET scan April 2019 showed low-grade uptake suspicious for low-grade adenocarcinoma.  Lung mass is presently being evaluated by Dr. Roxy Manns and there is consideration of possible resection.  He also noted worsening congestive heart failure symptoms and question angina and cardiology asked to evaluate.  Since she was last seen  she recently had increased dyspnea on exertion and pedal edema.  She was seen by nephrology and her diuretics were increased and her symptoms have improved.  She still has dyspnea on exertion but no pedal edema.  She has occasional chest pain for 5 to 10 minutes not related to activities.  It is sharp without radiation or associated symptoms.  She does not have exertional chest pain or syncope.   Current Outpatient Medications  Medication Sig Dispense Refill  . amLODipine (NORVASC) 10 MG tablet Take 5 mg by mouth daily. TAKE 1/2 Tablet by mouth daily     . amoxicillin (AMOXIL) 500 MG capsule TAKE 4 CAPSULES BY MOUTH 1 HOUR PRIOR TO DENTAL APPOINTMENT 12 capsule 1  . dapsone 25 MG tablet Take 25 mg by  mouth daily.     . diphenhydrAMINE (BENADRYL) 50 MG capsule Take 1 capsule (50 mg total) by mouth every 6 (six) hours as needed for itching. 90 capsule 3  . furosemide (LASIX) 20 MG tablet Take 60 mg by mouth 2 (two) times daily.   10  . gabapentin (NEURONTIN) 100 MG capsule Take 100 mg by mouth 3 (three) times daily.    Marland Kitchen glucose blood (ONETOUCH VERIO) test strip USE AS INSTRUCTED 4 TIMES A DAY (BEFORE MEALS) & AT BEDTIME (ICD 10-E11.9) 150 each 3  . glucose blood (ONETOUCH VERIO) test strip 1 each by Other route 3 (three) times daily as needed for other. Dx E11.9 150 each 5  . HUMALOG KWIKPEN 100 UNIT/ML KiwkPen IF SUGAR:140-200 USE 8 UNITS,201-230 10 UNITS,231-260 12 UNITS,261-300 14 UNITS,>301 16 UNITS 27 pen 3  . Insulin Pen Needle (B-D UF III MINI PEN NEEDLES) 31G X 5 MM MISC USE TO ADMINISTER INSULIN FOUR TIMES A DAY DX E11.9 100 each 1  . KLOR-CON M20 20 MEQ tablet TAKE 2 TABLETS (40 MEQ TOTAL) BY MOUTH DAILY. 60 tablet 5  . levothyroxine (SYNTHROID, LEVOTHROID) 25 MCG tablet TAKE 2 TABLETS (50 MCG TOTAL) BY MOUTH DAILY. 60 tablet 2  . lisinopril (PRINIVIL,ZESTRIL) 5 MG tablet Take 5 mg by mouth at bedtime.     . metoprolol tartrate (LOPRESSOR) 25 MG tablet TAKE 1 TABLET BY MOUTH TWICE A DAY 180 tablet 1  . Multiple Vitamins-Minerals (CENTRUM SILVER 50+WOMEN) TABS Take 1 tablet by mouth daily with breakfast.    .  mycophenolate (MYFORTIC) 180 MG EC tablet Take 360 mg by mouth 2 (two) times daily.     [name] DELICA LANCETS 40X MISC Use to check blood sugars three times a day DX E11.9 100 each 5  . oxyCODONE-acetaminophen (ROXICET) 5-325 MG tablet Take 0.5-1 tablets by mouth every 6 (six) hours as needed for severe pain. 100 tablet 0  . tacrolimus (PROGRAF) 1 MG capsule Take 3 mg by mouth 2 (two) times daily. Take 3 tablets by mouth twice daily     . warfarin (COUMADIN) 5 MG tablet Take 5-7.5 mg by mouth See admin instructions. 7.5 mg on Monday and Friday, 5 mg all other days     No  current facility-administered medications for this visit.      Past Medical History:  Diagnosis Date  . Anemia, iron deficiency    of chronic disease  . Aortic stenosis    a. Severe AS by echo 11/2012.  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 Asystole ([place])    a. During ENT surgery 2005: developed marked asystole requiring CPR, felt due to vagal reaction (cath nonobst dz).  . Carotid artery disease ([place])    a. Carotid Dopplers performed in August 2013 showed 40-59% left stenosis and 0-39% right; f/u recommended in 2 years.   . Cerebrovascular accident [facility]) 2009   a. LMCA infarct felt embolic 7353, maintained on chronic coumadin.; denies residual on 04/05/2013  . CHF (congestive heart failure) ([place])   . Cholelithiasis   . Chronic cough onset 03/2010   Dr Melvyn Novas  . Chronic Persistent Atrial Fibrillation 12/31/2008   Qualifier: Diagnosis of  By: [name]    . Coronary artery disease 05/2002   a. Ant MI 2003 s/p PTCA/stent to RCA.   . Diverticulosis of colon   . Esophagitis, reflux   . ESRD (end stage renal disease) ([place])    a. Mass on L kidney per pt s/p nephrectomy - pt states not cancer - [facility] notes indicate ESRD due to HTN/DM - was previously on HD. b. Kidney transplant 02/2011.  Marland Kitchen GERD (gastroesophageal reflux disease)   . Gout   . Helicobacter pylori (H. pylori) infection    hx of  . Hemorrhoids   . Hx of colonic polyps    adenomatous  . Hyperlipidemia   . Hypertension   . Lung nodule seen on imaging study 04/07/2013   1.0 cm ground glass opacity RUL  . Myocardial infarction ([place]) 2003  . Pericardial effusion    a. Small by echo 11/2011.  Marland Kitchen Pruritic dermatitis    treated with steriods/UV light  . S/P aortic valve replacement with bioprosthetic valve and maze procedure 04/12/2013   8mm [facility] Ease bovine pericardial tissue valve   . S/P Maze operation for atrial fibrillation 04/12/2013   Complete bilateral atrial lesion set using bipolar  radiofrequency and cryothermy ablation with clipping of LA appendage  . Sleep apnea    Pt says testing was positive, intolerant of CPAP.  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  . Type II diabetes mellitus ([place])   . Unspecified hearing loss     Past Surgical History:  Procedure Laterality Date  . AORTIC VALVE REPLACEMENT N/A 04/12/2013   Procedure: AORTIC VALVE REPLACEMENT (AVR);  Surgeon: Rexene Alberts, MD;  Location: [place];  Service: Open Heart  Surgery;  Laterality: N/A;  . ARTERIOVENOUS GRAFT PLACEMENT Left   . ARTERIOVENOUS GRAFT PLACEMENT Left    "I've had 2 on my left; had one removed" (04/05/2013)   . ARTERY EXPLORATION Right 04/11/2013   Procedure: ARTERY EXPLORATION;  Surgeon: Rexene Alberts, MD;  Location: [place];  Service: Open Heart Surgery;  Laterality: Right;  Right carotid artery exploration  . AV FISTULA PLACEMENT Right   . AV FISTULA REPAIR Right    "took it out" ((/18/2014)  . CARDIOVERSION  05/29/2012   Procedure: CARDIOVERSION;  Surgeon: Lelon Perla, MD;  Location[facility]COPY;  Service: Cardiovascular;  Laterality: N/A;  . CHOLECYSTECTOMY  2009   with hernia removal  . CORONARY ANGIOPLASTY WITH STENT PLACEMENT Right    coronary artery  . INSERTION OF DIALYSIS CATHETER Bilateral    "over the years; took them both out" (04/05/2013)  . INTRAOPERATIVE TRANSESOPHAGEAL ECHOCARDIOGRAM N/A 04/11/2013   Procedure: INTRAOPERATIVE TRANSESOPHAGEAL ECHOCARDIOGRAM;  Surgeon: Rexene Alberts, MD;  Location: [place];  Service: Open Heart Surgery;  Laterality: N/A;  . INTRAOPERATIVE TRANSESOPHAGEAL ECHOCARDIOGRAM N/A 04/12/2013   Procedure: INTRAOPERATIVE TRANSESOPHAGEAL ECHOCARDIOGRAM;  Surgeon: Rexene Alberts, MD;  Location: [place];  Service: Open Heart Surgery;  Laterality: N/A;  . KIDNEY TRANSPLANT  03/16/11  .  LEFT AND RIGHT HEART CATHETERIZATION WITH CORONARY ANGIOGRAM N/A 04/06/2013   Procedure: LEFT AND RIGHT HEART CATHETERIZATION WITH CORONARY ANGIOGRAM;  Surgeon: Blane Ohara, MD;  Location: [facility] CATH LAB;  Service: Cardiovascular;  Laterality: N/A;  . MAZE N/A 04/12/2013   Procedure: MAZE;  Surgeon: Rexene Alberts, MD;  Location: [place];  Service: Open Heart Surgery;  Laterality: N/A;  . NASAL RECONSTRUCTION WITH SEPTAL REPAIR     "took it out" (04/05/2013)  . NEPHRECTOMY Left 2010   no CA on bx  . TONSILLECTOMY    . TOTAL ABDOMINAL HYSTERECTOMY    . TUBAL LIGATION      Social History   Socioeconomic History  . Marital status: Married    Spouse name: Not on file  . Number of children: 5  . Years of education: 36  . Highest education level: Not on file  Occupational History  . Occupation: retired    Fish farm manager: RETIRED  Social Needs  . Financial resource strain: Not on file  . Food insecurity:    Worry: Not on file    Inability: Not on file  . Transportation needs:    Medical: Not on file    Non-medical: Not on file  Tobacco Use  . Smoking status: Former Smoker    Packs/day: 1.00    Years: 30.00    Pack years: 30.00    Types: Cigarettes    Last attempt to quit: 07/19/2001    Years since quitting: 16.3  . Smokeless tobacco: Never Used  Substance and Sexual Activity  . Alcohol use: No  . Drug use: No  . Sexual activity: Not Currently  Lifestyle  . Physical activity:    Days per week: Not on file    Minutes per session: Not on file  . Stress: Not on file  Relationships  . Social connections:    Talks on phone: Not on file    Gets together: Not on file    Attends religious service: Not on file    Active member of club or organization: Not on file    Attends meetings of clubs or organizations: Not on file    Relationship status: Not on file  .  Intimate partner violence:    Fear of current or ex partner: Not on file    Emotionally abused: Not on file    Physically  abused: Not on file    Forced sexual activity: Not on file  Other Topics Concern  . Not on file  Social History Narrative   Patient signed a Designated Party Release to allow her spouse [name] and family and five children to have access to her medical records/information.     Patient lives with husband in a 2 story home.  Has 5 children. Retired from Personal assistant.  Education: 3 years of college.     Family History  Problem Relation Age of Onset  . Stroke Father   . Hypertension Mother   . Diabetes Other   . Diabetes Maternal Grandmother   . Diabetes Son   . Breast cancer Neg Hx     ROS: no fevers or chills, productive cough, hemoptysis, dysphasia, odynophagia, melena, hematochezia, dysuria, hematuria, rash, seizure activity, orthopnea, PND, pedal edema, claudication. Remaining systems are negative.  Physical Exam: Well-developed well-nourished in no acute distress.  Skin is warm and dry.  HEENT is normal.  Neck is supple.  Chest is clear to auscultation with normal expansion.  Cardiovascular exam is regular rate and rhythm.  2/6 systolic murmur left sternal border. Abdominal exam nontender or distended. No masses palpated. Extremities show trace edema. neuro grossly intact  ECG-normal sinus rhythm at a rate of 65.  Lateral T wave inversion.  No change compared to July 05, 2017.  Personally reviewed  A/P  1 status post aortic valve replacement-continue SBE prophylaxis.  2 hypertension-blood pressure is controlled.  Continue present medications.  3 chest pain-symptoms are somewhat atypical.  I will arrange a [place] nuclear study for risk stratification.  4 coronary artery disease-patient is not on aspirin given need for anticoagulation.  I will add statin.  5 chronic diastolic congestive heart failure-patient has improved following increasing diuretic.  Will continue present dose of Lasix.  Renal function monitored by primary care.  Repeat echocardiogram both for LV  function and to assess aortic valve.  6 paroxysmal atrial fibrillation-patient in sinus rhythm on examination.  Continue beta-blocker and Coumadin.  7 peripheral vascular disease-medical therapy per Dr Fletcher Anon.  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 Roxy Manns.  9 hyperlipidemia-add Crestor 20 mg daily.  Check lipids and liver in 4 weeks.  [name], MD

## 2017-11-15 NOTE — Addendum Note (Signed)
Addended by: Diana Eves on: 11/15/2017 04:20 PM   Modules accepted: Orders

## 2017-11-16 ENCOUNTER — Ambulatory Visit (HOSPITAL_COMMUNITY)
Admission: RE | Admit: 2017-11-16 | Discharge: 2017-11-16 | Disposition: A | Discharge status: Home / Self Care | Payer: Medicare Other | Source: Ambulatory Visit | Attending: Thoracic Surgery (Cardiothoracic Vascular Surgery) | Admitting: Thoracic Surgery (Cardiothoracic Vascular Surgery)

## 2017-11-22 ENCOUNTER — Telehealth (HOSPITAL_COMMUNITY): Payer: Self-pay | Admitting: *Deleted

## 2017-11-22 NOTE — Telephone Encounter (Signed)
Patient given detailed instructions per Myocardial Perfusion Study Information Sheet for the test on 11/24/17. Patient notified to arrive 15 minutes early and that it is imperative to arrive on time for appointment to keep from having the test rescheduled.  If you need to cancel or reschedule your appointment, please call the office within 24 hours of your appointment. . Patient verbalized understanding. Kirstie Peri

## 2017-11-23 ENCOUNTER — Other Ambulatory Visit: Payer: Self-pay | Admitting: Radiology

## 2017-11-24 ENCOUNTER — Other Ambulatory Visit: Payer: Self-pay

## 2017-11-24 ENCOUNTER — Ambulatory Visit (HOSPITAL_BASED_OUTPATIENT_CLINIC_OR_DEPARTMENT_OTHER): Payer: Medicare Other

## 2017-11-24 ENCOUNTER — Encounter (HOSPITAL_COMMUNITY): Payer: Self-pay | Admitting: *Deleted

## 2017-11-24 DIAGNOSIS — M109 Gout, unspecified: Secondary | ICD-10-CM | POA: Diagnosis not present

## 2017-11-24 DIAGNOSIS — R072 Precordial pain: Secondary | ICD-10-CM

## 2017-11-24 DIAGNOSIS — I252 Old myocardial infarction: Secondary | ICD-10-CM | POA: Diagnosis not present

## 2017-11-24 DIAGNOSIS — K219 Gastro-esophageal reflux disease without esophagitis: Secondary | ICD-10-CM | POA: Diagnosis not present

## 2017-11-24 DIAGNOSIS — Z79899 Other long term (current) drug therapy: Secondary | ICD-10-CM | POA: Diagnosis not present

## 2017-11-24 DIAGNOSIS — Z7901 Long term (current) use of anticoagulants: Secondary | ICD-10-CM | POA: Diagnosis not present

## 2017-11-24 DIAGNOSIS — R0602 Shortness of breath: Secondary | ICD-10-CM

## 2017-11-24 DIAGNOSIS — I350 Nonrheumatic aortic (valve) stenosis: Secondary | ICD-10-CM | POA: Diagnosis not present

## 2017-11-24 DIAGNOSIS — I110 Hypertensive heart disease with heart failure: Secondary | ICD-10-CM | POA: Diagnosis not present

## 2017-11-24 DIAGNOSIS — Z8673 Personal history of transient ischemic attack (TIA), and cerebral infarction without residual deficits: Secondary | ICD-10-CM | POA: Diagnosis not present

## 2017-11-24 DIAGNOSIS — I481 Persistent atrial fibrillation: Secondary | ICD-10-CM | POA: Diagnosis not present

## 2017-11-24 DIAGNOSIS — Z952 Presence of prosthetic heart valve: Secondary | ICD-10-CM | POA: Diagnosis not present

## 2017-11-24 DIAGNOSIS — I509 Heart failure, unspecified: Secondary | ICD-10-CM | POA: Diagnosis not present

## 2017-11-24 DIAGNOSIS — E119 Type 2 diabetes mellitus without complications: Secondary | ICD-10-CM | POA: Diagnosis not present

## 2017-11-24 DIAGNOSIS — Z940 Kidney transplant status: Secondary | ICD-10-CM | POA: Diagnosis not present

## 2017-11-24 DIAGNOSIS — R911 Solitary pulmonary nodule: Secondary | ICD-10-CM | POA: Diagnosis present

## 2017-11-24 DIAGNOSIS — I6522 Occlusion and stenosis of left carotid artery: Secondary | ICD-10-CM | POA: Diagnosis not present

## 2017-11-24 DIAGNOSIS — Z955 Presence of coronary angioplasty implant and graft: Secondary | ICD-10-CM | POA: Diagnosis not present

## 2017-11-24 DIAGNOSIS — E785 Hyperlipidemia, unspecified: Secondary | ICD-10-CM | POA: Diagnosis not present

## 2017-11-24 DIAGNOSIS — Z794 Long term (current) use of insulin: Secondary | ICD-10-CM | POA: Diagnosis not present

## 2017-11-24 DIAGNOSIS — G4730 Sleep apnea, unspecified: Secondary | ICD-10-CM | POA: Diagnosis not present

## 2017-11-24 DIAGNOSIS — Z87891 Personal history of nicotine dependence: Secondary | ICD-10-CM | POA: Diagnosis not present

## 2017-11-24 DIAGNOSIS — C3411 Malignant neoplasm of upper lobe, right bronchus or lung: Secondary | ICD-10-CM | POA: Diagnosis not present

## 2017-11-24 DIAGNOSIS — Z8719 Personal history of other diseases of the digestive system: Secondary | ICD-10-CM | POA: Diagnosis not present

## 2017-11-24 DIAGNOSIS — I2510 Atherosclerotic heart disease of native coronary artery without angina pectoris: Secondary | ICD-10-CM | POA: Diagnosis not present

## 2017-11-24 DIAGNOSIS — Z86010 Personal history of colonic polyps: Secondary | ICD-10-CM | POA: Diagnosis not present

## 2017-11-24 LAB — ECHOCARDIOGRAM COMPLETE
Height: 61 in
Weight: 2656 oz

## 2017-11-24 MED ORDER — TECHNETIUM TC 99M TETROFOSMIN IV KIT
30.6000 | PACK | Freq: Once | INTRAVENOUS | Status: AC | PRN
  Administered 2017-11-24: 30.6 via INTRAVENOUS
  Filled 2017-11-24: qty 31

## 2017-11-25 ENCOUNTER — Ambulatory Visit (HOSPITAL_COMMUNITY)
Admission: RE | Admit: 2017-11-25 | Discharge: 2017-11-25 | Disposition: A | Discharge status: Home / Self Care | Payer: Medicare Other | Source: Ambulatory Visit | Attending: Thoracic Surgery (Cardiothoracic Vascular Surgery) | Admitting: Thoracic Surgery (Cardiothoracic Vascular Surgery)

## 2017-11-25 ENCOUNTER — Encounter (HOSPITAL_COMMUNITY): Payer: Self-pay

## 2017-11-25 ENCOUNTER — Ambulatory Visit (HOSPITAL_COMMUNITY)
Admission: RE | Admit: 2017-11-25 | Discharge: 2017-11-25 | Disposition: A | Discharge status: Home / Self Care | Payer: Medicare Other | Source: Ambulatory Visit | Attending: Interventional Radiology | Admitting: Interventional Radiology

## 2017-11-25 DIAGNOSIS — Z8673 Personal history of transient ischemic attack (TIA), and cerebral infarction without residual deficits: Secondary | ICD-10-CM | POA: Insufficient documentation

## 2017-11-25 DIAGNOSIS — I252 Old myocardial infarction: Secondary | ICD-10-CM | POA: Insufficient documentation

## 2017-11-25 DIAGNOSIS — Z940 Kidney transplant status: Secondary | ICD-10-CM | POA: Insufficient documentation

## 2017-11-25 DIAGNOSIS — C3491 Malignant neoplasm of unspecified part of right bronchus or lung: Secondary | ICD-10-CM

## 2017-11-25 DIAGNOSIS — R911 Solitary pulmonary nodule: Secondary | ICD-10-CM | POA: Diagnosis not present

## 2017-11-25 DIAGNOSIS — I509 Heart failure, unspecified: Secondary | ICD-10-CM | POA: Insufficient documentation

## 2017-11-25 DIAGNOSIS — R0989 Other specified symptoms and signs involving the circulatory and respiratory systems: Secondary | ICD-10-CM | POA: Diagnosis not present

## 2017-11-25 DIAGNOSIS — Z7901 Long term (current) use of anticoagulants: Secondary | ICD-10-CM | POA: Insufficient documentation

## 2017-11-25 DIAGNOSIS — I350 Nonrheumatic aortic (valve) stenosis: Secondary | ICD-10-CM | POA: Diagnosis not present

## 2017-11-25 DIAGNOSIS — I110 Hypertensive heart disease with heart failure: Secondary | ICD-10-CM | POA: Insufficient documentation

## 2017-11-25 DIAGNOSIS — Z9889 Other specified postprocedural states: Secondary | ICD-10-CM

## 2017-11-25 DIAGNOSIS — Z794 Long term (current) use of insulin: Secondary | ICD-10-CM | POA: Insufficient documentation

## 2017-11-25 DIAGNOSIS — R072 Precordial pain: Secondary | ICD-10-CM | POA: Insufficient documentation

## 2017-11-25 DIAGNOSIS — Z87891 Personal history of nicotine dependence: Secondary | ICD-10-CM | POA: Insufficient documentation

## 2017-11-25 DIAGNOSIS — Z955 Presence of coronary angioplasty implant and graft: Secondary | ICD-10-CM | POA: Insufficient documentation

## 2017-11-25 DIAGNOSIS — I481 Persistent atrial fibrillation: Secondary | ICD-10-CM | POA: Diagnosis not present

## 2017-11-25 DIAGNOSIS — E785 Hyperlipidemia, unspecified: Secondary | ICD-10-CM | POA: Insufficient documentation

## 2017-11-25 DIAGNOSIS — G4730 Sleep apnea, unspecified: Secondary | ICD-10-CM | POA: Insufficient documentation

## 2017-11-25 DIAGNOSIS — K219 Gastro-esophageal reflux disease without esophagitis: Secondary | ICD-10-CM | POA: Insufficient documentation

## 2017-11-25 DIAGNOSIS — M109 Gout, unspecified: Secondary | ICD-10-CM | POA: Insufficient documentation

## 2017-11-25 DIAGNOSIS — I2510 Atherosclerotic heart disease of native coronary artery without angina pectoris: Secondary | ICD-10-CM | POA: Insufficient documentation

## 2017-11-25 DIAGNOSIS — R918 Other nonspecific abnormal finding of lung field: Secondary | ICD-10-CM

## 2017-11-25 DIAGNOSIS — Z8719 Personal history of other diseases of the digestive system: Secondary | ICD-10-CM | POA: Insufficient documentation

## 2017-11-25 DIAGNOSIS — I6522 Occlusion and stenosis of left carotid artery: Secondary | ICD-10-CM | POA: Insufficient documentation

## 2017-11-25 DIAGNOSIS — C3411 Malignant neoplasm of upper lobe, right bronchus or lung: Secondary | ICD-10-CM | POA: Diagnosis not present

## 2017-11-25 DIAGNOSIS — Z79899 Other long term (current) drug therapy: Secondary | ICD-10-CM | POA: Insufficient documentation

## 2017-11-25 DIAGNOSIS — Z86010 Personal history of colonic polyps: Secondary | ICD-10-CM | POA: Insufficient documentation

## 2017-11-25 DIAGNOSIS — Z952 Presence of prosthetic heart valve: Secondary | ICD-10-CM | POA: Insufficient documentation

## 2017-11-25 DIAGNOSIS — E119 Type 2 diabetes mellitus without complications: Secondary | ICD-10-CM | POA: Insufficient documentation

## 2017-11-25 HISTORY — DX: Malignant neoplasm of unspecified part of right bronchus or lung: C34.91

## 2017-11-25 LAB — CBC
HCT: 39.3 % (ref 36.0–46.0)
Hemoglobin: 11.9 g/dL — ABNORMAL LOW (ref 12.0–15.0)
MCH: 23.8 pg — ABNORMAL LOW (ref 26.0–34.0)
MCHC: 30.3 g/dL (ref 30.0–36.0)
MCV: 78.8 fL (ref 78.0–100.0)
PLATELETS: 183 10*3/uL (ref 150–400)
RBC: 4.99 MIL/uL (ref 3.87–5.11)
RDW: 16.9 % — AB (ref 11.5–15.5)
WBC: 5.2 10*3/uL (ref 4.0–10.5)

## 2017-11-25 LAB — GLUCOSE, CAPILLARY
Glucose-Capillary: 188 mg/dL — ABNORMAL HIGH (ref 65–99)
Glucose-Capillary: 59 mg/dL — ABNORMAL LOW (ref 65–99)
Glucose-Capillary: 127 mg/dL — ABNORMAL HIGH (ref 65–99)

## 2017-11-25 LAB — PROTIME-INR
INR: 1.15
Prothrombin Time: 14.6 seconds (ref 11.4–15.2)

## 2017-11-25 MED ORDER — OXYCODONE-ACETAMINOPHEN 5-325 MG PO TABS: 1.0000 | ORAL_TABLET | Freq: Once | ORAL | Status: DC

## 2017-11-25 MED ORDER — MIDAZOLAM HCL 2 MG/2ML IJ SOLN
INTRAMUSCULAR | Status: AC | PRN
  Administered 2017-11-25: 0.5 mg via INTRAVENOUS

## 2017-11-25 MED ORDER — FENTANYL CITRATE (PF) 100 MCG/2ML IJ SOLN
INTRAMUSCULAR | Status: AC
  Filled 2017-11-25: qty 2

## 2017-11-25 MED ORDER — FENTANYL CITRATE (PF) 100 MCG/2ML IJ SOLN
INTRAMUSCULAR | Status: AC | PRN
  Administered 2017-11-25 (×2): 25 ug via INTRAVENOUS

## 2017-11-25 MED ORDER — DEXTROSE 50 % IV SOLN
INTRAVENOUS | Status: AC
  Filled 2017-11-25: qty 50

## 2017-11-25 MED ORDER — LIDOCAINE HCL 1 % IJ SOLN
INTRAMUSCULAR | Status: AC
  Filled 2017-11-25: qty 20

## 2017-11-25 MED ORDER — MIDAZOLAM HCL 2 MG/2ML IJ SOLN
INTRAMUSCULAR | Status: AC
  Filled 2017-11-25: qty 2

## 2017-11-25 MED ORDER — OXYCODONE-ACETAMINOPHEN 5-325 MG PO TABS
ORAL_TABLET | ORAL | Status: AC
  Administered 2017-11-25: 1
  Filled 2017-11-25: qty 1

## 2017-11-25 MED ORDER — SODIUM CHLORIDE 0.9 % IV SOLN: INTRAVENOUS | Status: DC

## 2017-11-25 MED ORDER — DEXTROSE 50 % IV SOLN: 25.0000 mL | Freq: Once | INTRAVENOUS | Status: DC

## 2017-11-25 NOTE — Procedures (Signed)
Pre procedural Dx: Right upper lobe pulmonary nodule  Post procedural Dx: Same  Technically successful CT guided biopsy of right upper lobe pulmonary nodule.   EBL: Minimal.   Complications: Procedure complicated development of solitary episode of self limited hemoptysis.  Ronny Bacon, MD Pager #: [phone]

## 2017-11-25 NOTE — H&P (Addendum)
Chief Complaint: Patient was seen in consultation today for right lung mass biopsy at the request of [name],[name]  Referring Physician(s): [name],[name]  Supervising Physician: Sandi Mariscal  Patient Status: [facility] - Out-pt  History of Present Illness: [name] is a 72 y.o. female   Dr Roxy Manns note 11/07/17:  Follow-up surveillance of small lung nodules seen on previous imaging studies.  The patient was last seen here in our office on November 10, 2015 at which time the small groundglass opacities in the right lung appeared stable.  Follow-up imaging in 2 years was recommended at that time.  The patient recently underwent follow-up CT which revealed a new suspicious nodule in the right upper lobe.  PET CT scan was performed and the patient returns to our office today for follow-up.  Patient has a new 1.4 cm nodular density in the right upper lobe associated with mildly increased FDG uptake on PET imaging, suspicious for possible low-grade adenocarcinoma.  All other lung nodules remain stable or have resolved and there are no other areas on PET imaging that appears suspicious.  Coincidentally the patient has had gradual progression of symptoms of exertional shortness of breath, fatigue, orthopnea, and lower extremity edema consistent with worsening chronic diastolic congestive heart failure.  She may be having angina.  Her functional status is marginal.  I question that she would tolerate surgery for wedge resection.    PET 11/03/17: IMPRESSION: 14 mm sub-solid pulmonary nodule right upper lobe shows low-grade FDG activity, suspicious for low-grade adenocarcinoma. Sub-cm right upper lobe and left lower lobe pulmonary nodules show no FDG uptake, but are too small to characterize by PET. No evidence of metastatic disease   Now scheduled for right lung nodule biopsy LD coumadin 11/20/17   Past Medical History:  Diagnosis Date  . Anemia, iron deficiency    of chronic disease  .  Aortic stenosis    a. Severe AS by echo 11/2012.  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 Asystole ([place])    a. During ENT surgery 2005: developed marked asystole requiring CPR, felt due to vagal reaction (cath nonobst dz).  . Carotid artery disease ([place])    a. Carotid Dopplers performed in August 2013 showed 40-59% left stenosis and 0-39% right; f/u recommended in 2 years.   . Cerebrovascular accident [facility]) 2009   a. LMCA infarct felt embolic 8850, maintained on chronic coumadin.; denies residual on 04/05/2013  . CHF (congestive heart failure) ([place])   . Cholelithiasis   . Chronic cough onset 03/2010   Dr Melvyn Novas  . Chronic Persistent Atrial Fibrillation 12/31/2008   Qualifier: Diagnosis of  By: [name]    . Coronary artery disease 05/2002   a. Ant MI 2003 s/p PTCA/stent to RCA.   . Diverticulosis of colon   . Esophagitis, reflux   . ESRD (end stage renal disease) ([place])    a. Mass on L kidney per pt s/p nephrectomy - pt states not cancer - [facility] notes indicate ESRD due to HTN/DM - was previously on HD. b. Kidney transplant 02/2011.  Marland Kitchen GERD (gastroesophageal reflux disease)   . Gout   . Helicobacter pylori (H. pylori) infection    hx of  . Hemorrhoids   . Hx of colonic polyps    adenomatous  . Hyperlipidemia   . Hypertension   . Lung nodule seen on imaging study 04/07/2013   1.0 cm ground glass opacity RUL  . Myocardial infarction ([place]) 2003  .  Pericardial effusion    a. Small by echo 11/2011.  Marland Kitchen Pruritic dermatitis    treated with steriods/UV light  . S/P aortic valve replacement with bioprosthetic valve and maze procedure 04/12/2013   49mm [facility] Ease bovine pericardial tissue valve   . S/P Maze operation for atrial fibrillation 04/12/2013   Complete bilateral atrial lesion set using bipolar radiofrequency and cryothermy ablation with clipping of LA appendage  . Sleep apnea    Pt says testing was positive, intolerant of CPAP.  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  . Type II diabetes mellitus ([place])   . Unspecified hearing loss     Past Surgical History:  Procedure Laterality Date  . AORTIC VALVE REPLACEMENT N/A 04/12/2013   Procedure: AORTIC VALVE REPLACEMENT (AVR);  Surgeon: Rexene Alberts, MD;  Location: [place];  Service: Open Heart Surgery;  Laterality: N/A;  . ARTERIOVENOUS GRAFT PLACEMENT Left   . ARTERIOVENOUS GRAFT PLACEMENT Left    "I've had 2 on my left; had one removed" (04/05/2013)   . ARTERY EXPLORATION Right 04/11/2013   Procedure: ARTERY EXPLORATION;  Surgeon: Rexene Alberts, MD;  Location: [place];  Service: Open Heart Surgery;  Laterality: Right;  Right carotid artery exploration  . AV FISTULA PLACEMENT Right   . AV FISTULA REPAIR Right    "took it out" ((/18/2014)  . CARDIOVERSION  05/29/2012   Procedure: CARDIOVERSION;  Surgeon: Lelon Perla, MD;  Location: [facility] ENDOSCOPY;  Service: Cardiovascular;  Laterality: N/A;  . CHOLECYSTECTOMY  2009   with hernia removal  . CORONARY ANGIOPLASTY WITH STENT PLACEMENT Right    coronary artery  . INSERTION OF DIALYSIS CATHETER Bilateral    "over the years; took them both out" (04/05/2013)  . INTRAOPERATIVE TRANSESOPHAGEAL ECHOCARDIOGRAM N/A 04/11/2013   Procedure: INTRAOPERATIVE TRANSESOPHAGEAL ECHOCARDIOGRAM;  Surgeon: Rexene Alberts, MD;  Location: [place];  Service: Open Heart Surgery;  Laterality: N/A;  . INTRAOPERATIVE TRANSESOPHAGEAL ECHOCARDIOGRAM N/A 04/12/2013   Procedure: INTRAOPERATIVE TRANSESOPHAGEAL ECHOCARDIOGRAM;  Surgeon: Rexene Alberts, MD;  Location: [place];  Service: Open Heart Surgery;  Laterality: N/A;  . KIDNEY TRANSPLANT  03/16/11  . LEFT AND RIGHT HEART CATHETERIZATION WITH CORONARY ANGIOGRAM N/A 04/06/2013   Procedure: LEFT AND RIGHT HEART CATHETERIZATION WITH CORONARY ANGIOGRAM;  Surgeon: Blane Ohara, MD;  Location: [facility] CATH LAB;  Service: Cardiovascular;  Laterality: N/A;  . MAZE N/A 04/12/2013   Procedure: MAZE;  Surgeon: Rexene Alberts, MD;  Location: [place];  Service: Open Heart Surgery;  Laterality: N/A;  . NASAL RECONSTRUCTION WITH SEPTAL REPAIR     "took it out" (04/05/2013)  . NEPHRECTOMY Left 2010   no CA on bx  . TONSILLECTOMY    . TOTAL ABDOMINAL HYSTERECTOMY    . TUBAL LIGATION      Allergies: Ibuprofen; Sulfamethoxazole-trimethoprim; Sulfonamide derivatives; Tape; Tramadol; Doxycycline; Hydrocil [psyllium]; Bactrim; and Red dye  Medications: Prior to Admission medications   Medication Sig Start Date End Date Taking? Authorizing Provider  amLODipine (NORVASC) 10 MG tablet Take 5 mg by mouth daily. TAKE 1/2 Tablet by mouth daily     [provider]  amoxicillin (AMOXIL) 500 MG capsule TAKE 4 CAPSULES BY MOUTH 1 HOUR PRIOR TO DENTAL APPOINTMENT 10/14/17   Plotnikov, [name], MD  dapsone 25 MG tablet Take 25 mg by mouth daily.  [provider]  diphenhydrAMINE (BENADRYL) 50 MG capsule Take 1 capsule (50 mg total) by mouth every 6 (six) hours as needed for itching. 08/31/11   Plotnikov, [name], MD  furosemide (LASIX) 20 MG tablet Take 60 mg by mouth 2 (two) times daily.  04/11/17   [provider]  gabapentin (NEURONTIN) 100 MG capsule Take 100 mg by mouth 3 (three) times daily.    [provider]  glucose blood (ONETOUCH VERIO) test strip USE AS INSTRUCTED 4 TIMES A DAY (BEFORE MEALS) & AT BEDTIME (ICD 10-E11.9) 06/23/17   Plotnikov, [name], MD  glucose blood (ONETOUCH VERIO) test strip 1 each by Other route 3 (three) times daily as needed for other. Dx E11.9 10/12/17   Plotnikov, [name], MD  HUMALOG KWIKPEN 100 UNIT/ML KiwkPen IF SUGAR:140-200 USE 8 UNITS,201-230 10 UNITS,231-260 12 UNITS,261-300 14 UNITS,>301 16 UNITS 08/04/17   Plotnikov, [name], MD  Insulin Pen Needle (B-D UF III MINI PEN NEEDLES) 31G X 5 MM MISC USE TO  ADMINISTER INSULIN FOUR TIMES A DAY DX E11.9 11/11/17   Plotnikov, [name], MD  KLOR-CON M20 20 MEQ tablet TAKE 2 TABLETS (40 MEQ TOTAL) BY MOUTH DAILY. 02/24/17   Plotnikov, [name], MD  levothyroxine (SYNTHROID, LEVOTHROID) 25 MCG tablet TAKE 2 TABLETS (50 MCG TOTAL) BY MOUTH DAILY. 11/09/17   Plotnikov, [name], MD  lisinopril (PRINIVIL,ZESTRIL) 5 MG tablet Take 5 mg by mouth at bedtime.     [provider]  metoprolol tartrate (LOPRESSOR) 25 MG tablet TAKE 1 TABLET BY MOUTH TWICE A DAY 09/14/17   Sherran Needs, NP  Multiple Vitamins-Minerals (CENTRUM SILVER 50+WOMEN) TABS Take 1 tablet by mouth daily with breakfast.    [provider]  mycophenolate (MYFORTIC) 180 MG EC tablet Take 360 mg by mouth 2 (two) times daily.     [provider]  Jonetta Speak LANCETS 27O MISC Use to check blood sugars three times a day DX E11.9 10/12/17   Plotnikov, [name], MD  oxyCODONE-acetaminophen (ROXICET) 5-325 MG tablet Take 0.5-1 tablets by mouth every 6 (six) hours as needed for severe pain. 09/02/17   Plotnikov, [name], MD  rosuvastatin (CRESTOR) 20 MG tablet Take 1 tablet (20 mg total) by mouth daily. 11/14/17   Lelon Perla, MD  tacrolimus (PROGRAF) 1 MG capsule Take 3 mg by mouth 2 (two) times daily. Take 3 tablets by mouth twice daily     [provider]  warfarin (COUMADIN) 5 MG tablet Take 5-7.5 mg by mouth See admin instructions. 7.5 mg on Monday and Friday, 5 mg all other days    [provider]     Family History  Problem Relation Age of Onset  . Stroke Father   . Hypertension Mother   . Diabetes Other   . Diabetes Maternal Grandmother   . Diabetes Son   . Breast cancer Neg Hx     Social History   Socioeconomic History  . Marital status: Married    Spouse name: Not on file  . Number of children: 5  . Years of education: 36  . Highest education level: Not on file  Occupational History  . Occupation: retired    Fish farm manager:  RETIRED  Social Needs  . Financial resource strain: Not on file  . Food insecurity:    Worry: Not on file    Inability: Not on file  . Transportation needs:    Medical: Not on file    Non-medical: Not  on file  Tobacco Use  . Smoking status: Former Smoker    Packs/day: 1.00    Years: 30.00    Pack years: 30.00    Types: Cigarettes    Last attempt to quit: 07/19/2001    Years since quitting: 16.3  . Smokeless tobacco: Never Used  Substance and Sexual Activity  . Alcohol use: No  . Drug use: No  . Sexual activity: Not Currently  Lifestyle  . Physical activity:    Days per week: Not on file    Minutes per session: Not on file  . Stress: Not on file  Relationships  . Social connections:    Talks on phone: Not on file    Gets together: Not on file    Attends religious service: Not on file    Active member of club or organization: Not on file    Attends meetings of clubs or organizations: Not on file    Relationship status: Not on file  Other Topics Concern  . Not on file  Social History Narrative   Patient signed a Designated Party Release to allow her spouse [name] and family and five children to have access to her medical records/information.     Patient lives with husband in a 2 story home.  Has 5 children. Retired from Personal assistant.  Education: 3 years of college.     Review of Systems: A 12 point ROS discussed and pertinent positives are indicated in the HPI above.  All other systems are negative.  Review of Systems  Constitutional: Positive for activity change and fatigue. Negative for fever.  Respiratory: Positive for shortness of breath. Negative for cough.   Cardiovascular: Negative for chest pain.  Gastrointestinal: Negative for nausea.  Neurological: Positive for weakness.  Psychiatric/Behavioral: Negative for behavioral problems and confusion.    Vital Signs: BP (!) 156/88   Pulse 66   Temp 98.4 F (36.9 C) (Oral)   Ht 5' (1.524 m)   Wt 163 lb (73.9  kg)   SpO2 93%   BMI 31.83 kg/m   Physical Exam  Cardiovascular: Normal rate.  Murmur heard. Pulmonary/Chest: Effort normal and breath sounds normal.  Abdominal: Soft. Bowel sounds are normal.  Musculoskeletal: Normal range of motion.  Neurological: She is alert.  Skin: Skin is warm and dry.  Psychiatric: She has a normal mood and affect. Her behavior is normal. Judgment and thought content normal.  Nursing note and vitals reviewed.   Imaging: Nm Pet Image Initial (pi) Skull Base To Thigh  Result Date: 11/03/2017 CLINICAL DATA:  Initial treatment strategy for bilateral pulmonary nodules. Former smoker. EXAM: NUCLEAR MEDICINE PET SKULL BASE TO THIGH TECHNIQUE: 8.6 mCi F-18 FDG was injected intravenously. Full-ring PET imaging was performed from the skull base to thigh after the radiotracer. CT data was obtained and used for attenuation correction and anatomic localization. Fasting blood glucose: 245 mg/dl COMPARISON:  Chest CT on 10/11/2017 and 11/10/2015 FINDINGS: Mediastinal blood pool activity: SUV max = 2.8 NECK:  No hypermetabolic lymph nodes or masses. Incidental CT findings:  None. CHEST: No hypermetabolic lymphadenopathy. 14 mm sub-solid pulmonary nodule in the right upper lobe shows low-grade FDG activity with SUV max of 1.8. 8 mm ground-glass nodule in the posterior right upper lobe on image 21/8 shows no FDG uptake, but is too small to characterize by PET. 6 mm pulmonary nodule in the posterior left lower lobe on image 27/8 shows no FDG uptake, but is also too small to characterize by PET.  Incidental CT findings: Aortic and coronary artery atherosclerosis. Stable moderate cardiomegaly. Previous aortic valve replacement. Stable tiny bilateral pleural effusions. Mild diffuse ground-glass opacity with mosaic attenuation, likely due to mild edema or small airways disease. ABDOMEN/PELVIS: No abnormal hypermetabolic activity within the liver, pancreas, adrenal glands, or spleen. No  hypermetabolic lymph nodes in the abdomen or pelvis. Incidental CT findings: Diffuse right renal atrophy and small renal cysts noted. Native left kidney not visualized. Renal transplant seen in left iliac fossa. No evidence of hydronephrosis. Nonobstructing calculi are noted in the native right kidney and left renal transplant. Aortic atherosclerosis. Small paraumbilical hernia containing only fat. Diverticulosis seen mainly involving the sigmoid colon, however there is no evidence of diverticulitis. SKELETON: No focal hypermetabolic bone lesions to suggest skeletal metastasis. Incidental CT findings:  None. IMPRESSION: 14 mm sub-solid pulmonary nodule shows low-grade FDG activity, suspicious for low-grade adenocarcinoma. Sub-cm right upper lobe and left lower lobe pulmonary nodules show no FDG uptake, but are too small to characterize by PET. No evidence of metastatic disease. Electronically Signed   By: Earle Gell M.D.   On: 11/03/2017 11:18    Labs:  CBC: Recent Labs    12/28/16 0355 12/29/16 0251 01/01/17 0344 01/07/17 1455  WBC 7.3 7.2 9.9 8.4  HGB 10.6* 10.6* 12.7 12.4  HCT 35.6* 36.0 41.2 39.9  PLT 231 241 306 249.0    COAGS: Recent Labs    08/18/17 1600 09/15/17 1546 10/14/17 1501 11/14/17 1223  INR 3.3 2.1 2.7 1.7    BMP: Recent Labs    12/29/16 0251 12/30/16 0148 12/31/16 0346 01/01/17 0344 01/07/17 1455 09/02/17 1552  NA 137 137 137 140 141 138  K 3.7 3.9 3.2* 3.6 4.7 3.8  CL 97* 97* 95* 97* 104 101  CO2 32 30 32 35* 32 30  GLUCOSE 251* 301* 213* 123* 128* 212*  BUN 26* 29* 37* 43* 34* 30*  CALCIUM 8.8* 9.6 9.5 9.9 10.5 9.7  CREATININE 1.63* 1.69* 1.65* 1.72* 1.54* 1.65*  GFRNONAA 30* 29* 30* 29*  --   --   GFRAA 35* 34* 35* 33*  --   --     LIVER FUNCTION TESTS: Recent Labs    01/01/17 0344 01/07/17 1455 09/02/17 1552  BILITOT 0.2* 0.4 0.5  AST 21 23 14   ALT 14 17 12   ALKPHOS 90 85 86  PROT 6.1* 6.6 6.6  ALBUMIN 2.5* 3.4* 3.4*    TUMOR  MARKERS: No results for input(s): AFPTM, CEA, CA199, CHROMGRNA in the last 8760 hours.  Assessment and Plan:  Right lung nodule Enlarging and +PET Now scheduled for biopsy of same Risks and benefits discussed with the patient including, but not limited to bleeding, hemoptysis, respiratory failure requiring intubation, infection, pneumothorax requiring chest tube placement, stroke from air embolism or even death.  All of the patient's questions were answered, patient is agreeable to proceed. Consent signed and in chart.   Thank you for this interesting consult.  I greatly enjoyed meeting Shron A Vernier and look forward to participating in their care.  A copy of this report was sent to the requesting provider on this date.  Electronically Signed: Lavonia Drafts, PA-C 11/25/2017, 10:02 AM   I spent a total of  30 Minutes   in face to face in clinical consultation, greater than 50% of which was counseling/coordinating care for right lung nodule bx

## 2017-11-25 NOTE — Progress Notes (Signed)
Dr Pascal Lux notified of c/o pain and order noted and med given and per Dr Pascal Lux OK to d/c home

## 2017-11-25 NOTE — Discharge Instructions (Addendum)
Needle Biopsy of the Lung, Care After °This sheet gives you information about how to care for yourself after your procedure. Your health care provider may also give you more specific instructions. If you have problems or questions, contact your health care provider. °What can I expect after the procedure? °After the procedure, it is common to have: °· Soreness, pain, and tenderness where a tissue sample was taken (biopsy site). °· A cough. °· A sore throat. ° °Follow these instructions at home: °Biopsy site care °· Follow instructions from your health care provider about when to remove the bandage that was placed on the biopsy site. °· Keep the bandage dry until it has been removed. °· Check your biopsy site every day for signs of infection. Check for: °? More redness, swelling, or pain. °? More fluid or blood. °? Warmth to the touch. °? Pus or a bad smell. °General instructions °· Rest as directed by your health care provider. Ask your health care provider what activities are safe for you. °· Do not take baths, swim, or use a hot tub until your health care provider approves. °· Take over-the-counter and prescription medicines only as told by your health care provider. °· If you have airplane travel scheduled, talk with your health care provider about when it is safe for you to travel by airplane. °· It is up to you to get the results of your procedure. Ask your health care provider, or the department that is doing the procedure, when your results will be ready. °· Keep all follow-up visits as told by your health care provider. This is important. °Contact a health care provider if: °· You have more redness, swelling, or pain around your biopsy site. °· You have more fluid or blood coming from your biopsy site. °· Your biopsy site feels warm to the touch. °· You have pus or a bad smell coming from your biopsy site. °· You have a fever. °· You have pain that does not get better with medicine. °Get help right away  if: °· You have problems breathing. °· You have chest pain. °· You cough up blood. °· You faint. °· You have a fast heart rate. °Summary °· After a needle biopsy of the lung, it is common to have a cough, a sore throat, or soreness, pain, and tenderness where a tissue sample was taken (biopsy site). °· You should check your biopsy area every day for signs of infection, including pus or a bad smell, warmth, more fluid or blood, or more redness, swelling, or pain. °· You should not take baths, swim, or use a hot tub until your health care provider approves. °· It is up to you to get the results of your procedure. Ask your health care provider, or the department that is doing the procedure, when your results will be ready. °This information is not intended to replace advice given to you by your health care provider. Make sure you discuss any questions you have with your health care provider. °Document Released: 05/02/2007 Document Revised: 05/26/2016 Document Reviewed: 05/26/2016 °Elsevier Interactive Patient Education © 2017 [employer] Inc[employer] ° °Moderate Conscious Sedation, Adult, Care After °These instructions provide you with information about caring for yourself after your procedure. Your health care provider may also give you more specific instructions. Your treatment has been planned according to current medical practices, but problems sometimes occur. Call your health care provider if you have any problems or questions after your procedure. °What can I expect after the   procedure? °After your procedure, it is common: °· To feel sleepy for several hours. °· To feel clumsy and have poor balance for several hours. °· To have poor judgment for several hours. °· To vomit if you eat too soon. ° °Follow these instructions at home: °For at least 24 hours after the procedure: ° °· Do not: °? Participate in activities where you could fall or become injured. °? Drive. °? Use heavy machinery. °? Drink alcohol. °? Take sleeping  pills or medicines that cause drowsiness. °? Make important decisions or sign legal documents. °? Take care of children on your own. °· Rest. °Eating and drinking °· Follow the diet recommended by your health care provider. °· If you vomit: °? Drink water, juice, or soup when you can drink without vomiting. °? Make sure you have little or no nausea before eating solid foods. °General instructions °· Have a responsible adult stay with you until you are awake and alert. °· Take over-the-counter and prescription medicines only as told by your health care provider. °· If you smoke, do not smoke without supervision. °· Keep all follow-up visits as told by your health care provider. This is important. °Contact a health care provider if: °· You keep feeling nauseous or you keep vomiting. °· You feel light-headed. °· You develop a rash. °· You have a fever. °Get help right away if: °· You have trouble breathing. °This information is not intended to replace advice given to you by your health care provider. Make sure you discuss any questions you have with your health care provider. °Document Released: 04/25/2013 Document Revised: 12/08/2015 Document Reviewed: 10/25/2015 °Elsevier Interactive Patient Education © 2018 [employer] Inc. ° °

## 2017-11-25 NOTE — Sedation Documentation (Signed)
Patient coughed up small amount of bright red blood. MD aware.

## 2017-11-28 ENCOUNTER — Encounter: Payer: Self-pay | Admitting: Thoracic Surgery (Cardiothoracic Vascular Surgery)

## 2017-11-28 ENCOUNTER — Ambulatory Visit (INDEPENDENT_AMBULATORY_CARE_PROVIDER_SITE_OTHER): Payer: Medicare Other | Admitting: Thoracic Surgery (Cardiothoracic Vascular Surgery)

## 2017-11-28 ENCOUNTER — Encounter (HOSPITAL_COMMUNITY): Payer: Medicare Other | Attending: Internal Medicine

## 2017-11-28 ENCOUNTER — Other Ambulatory Visit: Payer: Self-pay

## 2017-11-28 ENCOUNTER — Telehealth: Payer: Self-pay | Admitting: *Deleted

## 2017-11-28 VITALS — BP 165/75 | HR 72 | Resp 16 | Ht 61.0 in | Wt 166.0 lb

## 2017-11-28 DIAGNOSIS — I6522 Occlusion and stenosis of left carotid artery: Secondary | ICD-10-CM | POA: Diagnosis not present

## 2017-11-28 DIAGNOSIS — I509 Heart failure, unspecified: Secondary | ICD-10-CM | POA: Diagnosis not present

## 2017-11-28 DIAGNOSIS — I2510 Atherosclerotic heart disease of native coronary artery without angina pectoris: Secondary | ICD-10-CM

## 2017-11-28 DIAGNOSIS — C3411 Malignant neoplasm of upper lobe, right bronchus or lung: Secondary | ICD-10-CM | POA: Diagnosis not present

## 2017-11-28 DIAGNOSIS — I350 Nonrheumatic aortic (valve) stenosis: Secondary | ICD-10-CM | POA: Diagnosis not present

## 2017-11-28 DIAGNOSIS — R911 Solitary pulmonary nodule: Secondary | ICD-10-CM | POA: Diagnosis not present

## 2017-11-28 DIAGNOSIS — C3491 Malignant neoplasm of unspecified part of right bronchus or lung: Secondary | ICD-10-CM | POA: Diagnosis not present

## 2017-11-28 DIAGNOSIS — I110 Hypertensive heart disease with heart failure: Secondary | ICD-10-CM | POA: Diagnosis not present

## 2017-11-28 DIAGNOSIS — Z953 Presence of xenogenic heart valve: Secondary | ICD-10-CM | POA: Diagnosis not present

## 2017-11-28 DIAGNOSIS — I481 Persistent atrial fibrillation: Secondary | ICD-10-CM | POA: Diagnosis not present

## 2017-11-28 LAB — MYOCARDIAL PERFUSION IMAGING
CHL CUP NUCLEAR SDS: 1
CHL CUP NUCLEAR SRS: 3
CHL CUP NUCLEAR SSS: 4
CHL CUP RESTING HR STRESS: 61 {beats}/min
CSEPPHR: 75 {beats}/min
LV dias vol: 149 mL (ref 46–106)
LV sys vol: 60 mL
RATE: 0.27
TID: 0.77

## 2017-11-28 MED ORDER — REGADENOSON 0.4 MG/5ML IV SOLN
0.4000 mg | Freq: Once | INTRAVENOUS | Status: AC
  Administered 2017-11-28: 0.4 mg via INTRAVENOUS

## 2017-11-28 MED ORDER — TECHNETIUM TC 99M TETROFOSMIN IV KIT
33.0000 | PACK | Freq: Once | INTRAVENOUS | Status: AC | PRN
  Administered 2017-11-28: 33 via INTRAVENOUS
  Filled 2017-11-28: qty 33

## 2017-11-28 NOTE — Telephone Encounter (Signed)
Oncology Nurse Navigator Documentation  Oncology Nurse Navigator Flowsheets 11/28/2017  Navigator Location [facility]  Referral date to RadOnc/MedOnc 11/28/2017  Navigator Encounter Type Telephone/I received referral on Ms. Eshbach today. I called to schedule her to be seen next week at [facility].  I was unable to reach her but did leave vm message with my name and phone number.   Telephone Outgoing Call  Treatment Phase Pre-Tx/Tx Discussion  Barriers/Navigation Needs Coordination of Care  Interventions Coordination of Care  Coordination of Care Other  Acuity Level 1  Time Spent with Patient 15

## 2017-11-28 NOTE — Progress Notes (Signed)
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  Recent follow-up CT scan revealed a new 1.4 cm nodular density in the right upper lobe with mildly increased FDG uptake on PET imaging suspicious for low-grade adenocarcinoma.  She underwent transthoracic needle aspiration biopsy on Nov 25, 2017 and returns to our office to review the results of the test today.  She also underwent a stress test earlier today, results are pending.   Current Outpatient Medications  Medication Sig Dispense Refill  . amLODipine (NORVASC) 10 MG tablet Take 5 mg by mouth daily. TAKE 1/2 Tablet by mouth daily     . amoxicillin (AMOXIL) 500 MG capsule TAKE 4 CAPSULES BY MOUTH 1 HOUR PRIOR TO DENTAL APPOINTMENT 12 capsule 1  . dapsone 25 MG tablet Take 25 mg by mouth daily.     . diphenhydrAMINE (BENADRYL) 50 MG capsule Take 1 capsule (50 mg total) by mouth every 6 (six) hours as needed for itching. 90 capsule 3  . furosemide (LASIX) 20 MG tablet Take 60 mg by mouth 2 (two) times daily.   10  . gabapentin (NEURONTIN) 100 MG capsule Take 100 mg by mouth 3 (three) times  daily.    Marland Kitchen glucose blood (ONETOUCH VERIO) test strip USE AS INSTRUCTED 4 TIMES A DAY (BEFORE MEALS) & AT BEDTIME (ICD 10-E11.9) 150 each 3  . glucose blood (ONETOUCH VERIO) test strip 1 each by Other route 3 (three) times daily as needed for other. Dx E11.9 150 each 5  . HUMALOG KWIKPEN 100 UNIT/ML KiwkPen IF SUGAR:140-200 USE 8 UNITS,201-230 10 UNITS,231-260 12 UNITS,261-300 14 UNITS,>301 16 UNITS 27 pen 3  . Insulin Pen Needle (B-D UF III MINI PEN NEEDLES) 31G X 5 MM MISC USE TO ADMINISTER INSULIN FOUR TIMES A DAY DX E11.9 100 each 1  . KLOR-CON M20 20 MEQ tablet TAKE 2 TABLETS (40 MEQ TOTAL) BY MOUTH DAILY. 60 tablet 5  . levothyroxine (SYNTHROID, LEVOTHROID) 25 MCG tablet TAKE 2 TABLETS (50 MCG TOTAL) BY MOUTH DAILY. 60 tablet 2  . lisinopril (PRINIVIL,ZESTRIL) 5 MG tablet Take 5 mg by mouth at bedtime.     . metoprolol tartrate (LOPRESSOR) 25 MG tablet TAKE 1 TABLET BY MOUTH TWICE A DAY 180 tablet 1  . Multiple Vitamins-Minerals (CENTRUM SILVER 50+WOMEN) TABS Take 1 tablet by mouth daily with breakfast.    . mycophenolate (MYFORTIC) 180 MG EC tablet Take 360 mg by mouth 2 (two) times daily.     [name] DELICA LANCETS 18E MISC Use to check blood sugars three times a day DX E11.9 100 each 5  . oxyCODONE-acetaminophen (ROXICET) 5-325 MG tablet Take 0.5-1 tablets by mouth every 6 (six)  hours as needed for severe pain. 100 tablet 0  . rosuvastatin (CRESTOR) 20 MG tablet Take 1 tablet (20 mg total) by mouth daily. 30 tablet 11  . tacrolimus (PROGRAF) 1 MG capsule Take 3 mg by mouth 2 (two) times daily. Take 3 tablets by mouth twice daily     . warfarin (COUMADIN) 5 MG tablet Take 5-7.5 mg by mouth See admin instructions. 7.5 mg on Monday and Friday, 5 mg all other days     No current facility-administered medications for this visit.       Physical Exam:   BP (!) 165/75 (BP Location: Right Arm, Patient Position: Sitting, Cuff Size: Large)   Pulse 72   Resp 16   Ht 5\' 1"  (1.549 m)   Wt  166 lb (75.3 kg)   SpO2 92% Comment: ON RA  BMI 31.37 kg/m   General:  Elderly female in no distress  Chest:   Scattered rhonchi without wheezes  CV:   Regular rate and rhythm  Incisions:  n/a  Abdomen:  Soft nontender  Extremities:  Warm and well-perfused  Diagnostic Tests:  Pathology results from transthoracic needle biopsy of right upper lobe lung nodule revealed findings consistent with well differentiated adenocarcinoma   Impression:  Clinical stage T1, N0 well-differentiated adenocarcinoma of the lung.  This patient has numerous comorbid medical problems which would make surgical resection relatively high risk.    Plan:  We discussed the results of the patient's recent CT scan and subsequent needle biopsy confirmed the presence of what appears to be slow-growing, well-differentiated adenocarcinoma in the lung.  Treatment options were discussed at length including why I would be concerned as to whether or not the patient would do well with surgical resection.  The patient would not consent to elective surgical intervention under any circumstances.  Alternative treatment strategies including SBRT were discussed.  Patient will be referred to the multidisciplinary thoracic oncology clinic as soon as possible to discuss options further.  All of her questions have been addressed.  In the future she will call and return to see [place] as needed.  I spent in excess of 15 minutes during the conduct of this office consultation and >50% of this time involved direct face-to-face encounter with the patient for counseling and/or coordination of their care.   Valentina Gu. Roxy Manns, MD 11/28/2017 12:07 PM

## 2017-11-28 NOTE — Patient Instructions (Signed)
Continue all previous medications without any changes at this time  

## 2017-11-29 NOTE — Telephone Encounter (Signed)
Ms. Tien called and left message that she was returning a call to someone who was trying to call her. This message has been routed to thoracic coordinator due to documentation regarding her reaching out to Ms. Lofstrom.

## 2017-11-30 ENCOUNTER — Encounter: Payer: Self-pay | Admitting: Internal Medicine

## 2017-11-30 ENCOUNTER — Ambulatory Visit (INDEPENDENT_AMBULATORY_CARE_PROVIDER_SITE_OTHER): Payer: Medicare Other | Admitting: Internal Medicine

## 2017-11-30 DIAGNOSIS — E1121 Type 2 diabetes mellitus with diabetic nephropathy: Secondary | ICD-10-CM | POA: Diagnosis not present

## 2017-11-30 DIAGNOSIS — I2510 Atherosclerotic heart disease of native coronary artery without angina pectoris: Secondary | ICD-10-CM | POA: Diagnosis not present

## 2017-11-30 DIAGNOSIS — N186 End stage renal disease: Secondary | ICD-10-CM

## 2017-11-30 DIAGNOSIS — Z794 Long term (current) use of insulin: Secondary | ICD-10-CM | POA: Diagnosis not present

## 2017-11-30 DIAGNOSIS — C3490 Malignant neoplasm of unspecified part of unspecified bronchus or lung: Secondary | ICD-10-CM | POA: Insufficient documentation

## 2017-11-30 DIAGNOSIS — C3411 Malignant neoplasm of upper lobe, right bronchus or lung: Secondary | ICD-10-CM

## 2017-11-30 DIAGNOSIS — J209 Acute bronchitis, unspecified: Secondary | ICD-10-CM

## 2017-11-30 MED ORDER — CEFUROXIME AXETIL 250 MG PO TABS: 250.0000 mg | ORAL_TABLET | Freq: Two times a day (BID) | ORAL | 0 refills | Status: AC

## 2017-11-30 NOTE — Assessment & Plan Note (Signed)
The pt saw Dr Roxy Manns: CT scan revealed a new 1.4 cm nodular density in the right upper lobe with mildly increased FDG uptake on PET imaging suspicious for low-grade adenocarcinoma.  She underwent transthoracic needle aspiration biopsy on Nov 25, 2017 - it confirmed the presence of what appears to be slow-growing, well-differentiated adenocarcinoma in the lung.  Treatment options were discussed at length including why I would be concerned as to whether or not the patient would do well with surgical resection.  The patient would not consent to elective surgical intervention under any circumstances.  Alternative treatment strategies including SBRT were discussed.  Patient was referred to the multidisciplinary thoracic oncology clinic as soon as possible to discuss options further.

## 2017-11-30 NOTE — Progress Notes (Signed)
Subjective:  [name], female    DOB: 08/25/1945  Age: 72 y.o. MRN: [id number]  CC: No chief complaint on file.   HPI [name] presents for HTN, CRF. C/o cough since Friday  The pt saw Dr Roxy Manns: CT scan revealed a new 1.4 cm nodular density in the right upper lobe with mildly increased FDG uptake on PET imaging suspicious for low-grade adenocarcinoma.  She underwent transthoracic needle aspiration biopsy on Nov 25, 2017 - it confirmed the presence of what appears to be slow-growing, well-differentiated adenocarcinoma in the lung.  Treatment options were discussed at length including why I would be concerned as to whether or not the patient would do well with surgical resection.  The patient would not consent to elective surgical intervention under any circumstances.  Alternative treatment strategies including SBRT were discussed.  Patient was referred to the multidisciplinary thoracic oncology clinic as soon as possible to discuss options further.  C/o weakness - can't walk more than 25 ft w/o rest  A complex case  Outpatient Medications Prior to Visit  Medication Sig Dispense Refill  . amLODipine (NORVASC) 10 MG tablet Take 5 mg by mouth daily. TAKE 1/2 Tablet by mouth daily     . amoxicillin (AMOXIL) 500 MG capsule TAKE 4 CAPSULES BY MOUTH 1 HOUR PRIOR TO DENTAL APPOINTMENT 12 capsule 1  . dapsone 25 MG tablet Take 25 mg by mouth daily.     . diphenhydrAMINE (BENADRYL) 50 MG capsule Take 1 capsule (50 mg total) by mouth every 6 (six) hours as needed for itching. 90 capsule 3  . furosemide (LASIX) 20 MG tablet Take 60 mg by mouth 2 (two) times daily.   10  . gabapentin (NEURONTIN) 100 MG capsule Take 100 mg by mouth 3 (three) times daily.    Marland Kitchen glucose blood (ONETOUCH VERIO) test strip USE AS INSTRUCTED 4 TIMES A DAY (BEFORE MEALS) & AT BEDTIME (ICD 10-E11.9) 150 each 3  . glucose blood (ONETOUCH VERIO) test strip 1 each by Other route 3 (three) times daily as needed  for other. Dx E11.9 150 each 5  . HUMALOG KWIKPEN 100 UNIT/ML KiwkPen IF SUGAR:140-200 USE 8 UNITS,201-230 10 UNITS,231-260 12 UNITS,261-300 14 UNITS,>301 16 UNITS 27 pen 3  . Insulin Pen Needle (B-D UF III MINI PEN NEEDLES) 31G X 5 MM MISC USE TO ADMINISTER INSULIN FOUR TIMES A DAY DX E11.9 100 each 1  . KLOR-CON M20 20 MEQ tablet TAKE 2 TABLETS (40 MEQ TOTAL) BY MOUTH DAILY. 60 tablet 5  . levothyroxine (SYNTHROID, LEVOTHROID) 25 MCG tablet TAKE 2 TABLETS (50 MCG TOTAL) BY MOUTH DAILY. 60 tablet 2  . lisinopril (PRINIVIL,ZESTRIL) 5 MG tablet Take 5 mg by mouth at bedtime.     . metoprolol tartrate (LOPRESSOR) 25 MG tablet TAKE 1 TABLET BY MOUTH TWICE A DAY 180 tablet 1  . Multiple Vitamins-Minerals (CENTRUM SILVER 50+WOMEN) TABS Take 1 tablet by mouth daily with breakfast.    . mycophenolate (MYFORTIC) 180 MG EC tablet Take 360 mg by mouth 2 (two) times daily.     [name] DELICA LANCETS 78H MISC Use to check blood sugars three times a day DX E11.9 100 each 5  . oxyCODONE-acetaminophen (ROXICET) 5-325 MG tablet Take 0.5-1 tablets by mouth every 6 (six) hours as needed for severe pain. 100 tablet 0  . rosuvastatin (CRESTOR) 20 MG tablet Take 1 tablet (20 mg total) by mouth daily. 30 tablet 11  . tacrolimus (PROGRAF) 1 MG  capsule Take 3 mg by mouth 2 (two) times daily. Take 3 tablets by mouth twice daily     . warfarin (COUMADIN) 5 MG tablet Take 5-7.5 mg by mouth See admin instructions. 7.5 mg on Monday and Friday, 5 mg all other days     No facility-administered medications prior to visit.     ROS Review of Systems  Constitutional: Positive for fatigue. Negative for activity change, appetite change, chills and unexpected weight change.  HENT: Negative for congestion, mouth sores and sinus pressure.   Eyes: Negative for visual disturbance.  Respiratory: Positive for cough and shortness of breath. Negative for chest tightness.   Gastrointestinal: Negative for abdominal pain and nausea.    Genitourinary: Negative for difficulty urinating, frequency and vaginal pain.  Musculoskeletal: Negative for back pain and gait problem.  Skin: Negative for pallor and rash.  Neurological: Positive for weakness. Negative for dizziness, tremors, numbness and headaches.  Psychiatric/Behavioral: Positive for dysphoric mood. Negative for confusion, sleep disturbance and suicidal ideas. The patient is nervous/anxious.     Objective:  BP (!) 142/78 (BP Location: Left Arm, Patient Position: Sitting, Cuff Size: Normal)   Pulse (!) 57   Temp 99 F (37.2 C) (Oral)   Ht 5\' 1"  (1.549 m)   Wt 163 lb (73.9 kg)   SpO2 95%   BMI 30.80 kg/m   BP Readings from Last 3 Encounters:  11/30/17 (!) 142/78  11/28/17 (!) 165/75  11/25/17 (!) 143/69    Wt Readings from Last 3 Encounters:  11/30/17 163 lb (73.9 kg)  11/28/17 166 lb (75.3 kg)  11/25/17 163 lb (73.9 kg)    Physical Exam  Constitutional: She appears well-developed. No distress.  HENT:  Head: Normocephalic.  Right Ear: External ear normal.  Left Ear: External ear normal.  Nose: Nose normal.  Mouth/Throat: Oropharynx is clear and moist.  Eyes: Pupils are equal, round, and reactive to light. Conjunctivae are normal. Right eye exhibits no discharge. Left eye exhibits no discharge.  Neck: Normal range of motion. Neck supple. No JVD present. No tracheal deviation present. No thyromegaly present.  Cardiovascular: Normal rate, regular rhythm and normal heart sounds.  Pulmonary/Chest: No stridor. No respiratory distress. She has no wheezes.  Abdominal: Soft. Bowel sounds are normal. She exhibits no distension and no mass. There is no tenderness. There is no rebound and no guarding.  Musculoskeletal: She exhibits tenderness. She exhibits no edema.  Lymphadenopathy:    She has no cervical adenopathy.  Neurological: She displays normal reflexes. No cranial nerve deficit. She exhibits normal muscle tone. Coordination abnormal.  Skin: No rash  noted. No erythema.  Psychiatric: Her behavior is normal. Judgment and thought content normal.   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 A (cane, walker, crutch) will not resolve issue with performing activities of daily living. A motorized scooter will allow patient to safely perform dialy activities. Patient can not safely propel the wheel chair themselves due to (weakness, general weakness, endurance). Patient can use a motorized scooter. Patient is not able to propel self in a standard weight or a light weight wheelchair due (weakness).       Lab Results  Component Value Date   WBC 5.2 11/25/2017   HGB 11.9 (L) 11/25/2017   HCT 39.3 11/25/2017   PLT  183 11/25/2017   GLUCOSE 212 (H) 09/02/2017   CHOL 174 11/18/2016   TRIG 131.0 11/18/2016   HDL 75.10 11/18/2016   LDLDIRECT 89.3 06/13/2006   LDLCALC 72 11/18/2016   ALT 12 09/02/2017   AST 14 09/02/2017   NA 138 09/02/2017   K 3.8 09/02/2017   CL 101 09/02/2017   CREATININE 1.65 (H) 09/02/2017   BUN 30 (H) 09/02/2017   CO2 30 09/02/2017   TSH 2.93 09/02/2017   INR 1.15 11/25/2017   HGBA1C 8.7 (H) 09/02/2017   MICROALBUR 317.3 (H) 07/02/2016    Ct Biopsy  Result Date: 11/25/2017 INDICATION: Indeterminate right upper lobe pulmonary nodule. Please perform CT-guided biopsy for tissue diagnostic purposes. EXAM: CT-GUIDED BIOPSY OF INDETERMINATE RIGHT UPPER LOBE PULMONARY NODULE COMPARISON:  PET-CT - 11/03/2017; chest CT - 10/11/2017 MEDICATIONS: None. ANESTHESIA/SEDATION: Fentanyl 50 mcg IV; Versed 0.5 mg IV Sedation time: 15 minutes; The patient was continuously monitored during the procedure by the interventional radiology nurse under my direct supervision. CONTRAST:  None COMPLICATIONS: SIR Level A -  No therapy, no consequence. Procedure complicated by solitary episode of self-limited hemoptysis not requiring dedicated intervention. PROCEDURE: Informed consent was obtained from the patient following an explanation of the procedure, risks, benefits and alternatives. The patient understands,agrees and consents for the procedure. All questions were addressed. A time out was performed prior to the initiation of the procedure. The patient was positioned right lateral decubitus on the CT table and a limited chest CT was performed for procedural planning demonstrating unchanged size of the approximately 1.5 x 1.4 cm ground-glass nodular opacity within the right lung apex (image 17, series 2. The operative site was prepped and draped in the usual sterile fashion. Under sterile conditions and local anesthesia, a 17 gauge coaxial needle was advanced into the peripheral aspect of the nodule. Positioning was confirmed with intermittent CT fluoroscopy and followed by the acquisition of solitary core needle biopsy with an 18 gauge core needle biopsy device. Following the acquisition of the core needle biopsy, patient experienced a solitary episode of self-limited hemoptysis and as such, additional samples were not obtained. The coaxial needle was removed following deployment of a Biosentry plug and superficial hemostasis was achieved with manual compression. Patient was maintained right lateral decubitus until postprocedural radiograph was obtained. Limited post procedural chest CT was negative for pneumothorax or additional complication. A dressing was placed. The patient tolerated the procedure well without immediate postprocedural complication. The patient was escorted to have an upright chest radiograph. IMPRESSION: Technically successful CT guided core needle core biopsy of indeterminate right upper lobe pulmonary nodule complicated by development of solitary episode of self-limited hemoptysis. Electronically Signed    By: Sandi Mariscal M.D.   On: 11/25/2017 13:21   Dg Chest Port 1 View  Result Date: 11/25/2017 CLINICAL DATA:  Post right upper lobe pulmonary nodule biopsy EXAM: PORTABLE CHEST 1 VIEW COMPARISON:  CT-guided right upper lobe pulmonary nodule biopsy-earlier same day; PET-CT-11/03/2017; chest CT-10/11/2017; chest radiograph-01/07/2017; 12/27/2016 FINDINGS: Grossly unchanged enlarged cardiac silhouette and mediastinal contours post median sternotomy and valve replacement. Atherosclerotic plaque within the thoracic aorta. Mild pulmonary venous congestion without frank evidence of edema, grossly unchanged. There is persistent pleuroparenchymal thickening about the right minor fissure. Known ill-defined right upper lobe pulmonary nodule is not well demonstrated on the present examination. No new focal airspace opacities. No pleural effusion or pneumothorax. No acute osseus abnormalities. IMPRESSION: 1. No evidence of delayed complication following right upper lobe pulmonary nodule biopsy. Specifically, no evidence  of pneumothorax. 2. Similar findings of cardiomegaly and pulmonary venous congestion. Electronically Signed   By: Sandi Mariscal M.D.   On: 11/25/2017 13:20    Assessment & Plan:   There are no diagnoses linked to this encounter. I am having [name] maintain her mycophenolate, dapsone, diphenhydrAMINE, CENTRUM SILVER 50+WOMEN, KLOR-CON M20, furosemide, amLODipine, tacrolimus, glucose blood, HUMALOG KWIKPEN, oxyCODONE-acetaminophen, metoprolol tartrate, glucose blood, ONETOUCH DELICA LANCETS 93A, amoxicillin, lisinopril, levothyroxine, gabapentin, warfarin, Insulin Pen Needle, and rosuvastatin.  No orders of the defined types were placed in this encounter.    Follow-up: No follow-ups on file.  Walker Kehr, MD

## 2017-11-30 NOTE — Assessment & Plan Note (Signed)
Ceftin x 7 d 

## 2017-11-30 NOTE — Assessment & Plan Note (Signed)
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

## 2017-11-30 NOTE — Assessment & Plan Note (Signed)
Humalog SS

## 2017-12-01 ENCOUNTER — Other Ambulatory Visit: Payer: Self-pay | Admitting: Internal Medicine

## 2017-12-07 ENCOUNTER — Other Ambulatory Visit: Payer: Self-pay | Admitting: *Deleted

## 2017-12-07 DIAGNOSIS — C3491 Malignant neoplasm of unspecified part of right bronchus or lung: Secondary | ICD-10-CM

## 2017-12-08 ENCOUNTER — Telehealth: Payer: Self-pay

## 2017-12-08 ENCOUNTER — Ambulatory Visit
Admission: RE | Admit: 2017-12-08 | Discharge: 2017-12-08 | Disposition: A | Discharge status: Home / Self Care | Payer: Medicare Other | Source: Ambulatory Visit | Attending: Radiation Oncology | Admitting: Radiation Oncology

## 2017-12-08 ENCOUNTER — Ambulatory Visit: Payer: Medicare Other | Attending: Internal Medicine | Admitting: Physical Therapy

## 2017-12-08 ENCOUNTER — Other Ambulatory Visit: Payer: Self-pay

## 2017-12-08 ENCOUNTER — Inpatient Hospital Stay: Payer: Medicare Other

## 2017-12-08 ENCOUNTER — Encounter: Payer: Self-pay | Admitting: *Deleted

## 2017-12-08 ENCOUNTER — Other Ambulatory Visit: Payer: Self-pay | Admitting: *Deleted

## 2017-12-08 ENCOUNTER — Inpatient Hospital Stay: Payer: Medicare Other | Attending: Internal Medicine | Admitting: Internal Medicine

## 2017-12-08 VITALS — BP 160/71 | HR 67 | Temp 98.6°F | Resp 17 | Ht 61.0 in | Wt 165.3 lb

## 2017-12-08 DIAGNOSIS — C3411 Malignant neoplasm of upper lobe, right bronchus or lung: Secondary | ICD-10-CM

## 2017-12-08 DIAGNOSIS — Z87891 Personal history of nicotine dependence: Secondary | ICD-10-CM | POA: Diagnosis not present

## 2017-12-08 DIAGNOSIS — R2689 Other abnormalities of gait and mobility: Secondary | ICD-10-CM | POA: Diagnosis not present

## 2017-12-08 DIAGNOSIS — R293 Abnormal posture: Secondary | ICD-10-CM | POA: Insufficient documentation

## 2017-12-08 DIAGNOSIS — C3491 Malignant neoplasm of unspecified part of right bronchus or lung: Secondary | ICD-10-CM

## 2017-12-08 DIAGNOSIS — R2681 Unsteadiness on feet: Secondary | ICD-10-CM | POA: Diagnosis not present

## 2017-12-08 DIAGNOSIS — Z940 Kidney transplant status: Secondary | ICD-10-CM | POA: Diagnosis not present

## 2017-12-08 DIAGNOSIS — M6281 Muscle weakness (generalized): Secondary | ICD-10-CM | POA: Diagnosis not present

## 2017-12-08 DIAGNOSIS — Z79899 Other long term (current) drug therapy: Secondary | ICD-10-CM | POA: Diagnosis not present

## 2017-12-08 DIAGNOSIS — R918 Other nonspecific abnormal finding of lung field: Secondary | ICD-10-CM | POA: Diagnosis not present

## 2017-12-08 DIAGNOSIS — Z7189 Other specified counseling: Secondary | ICD-10-CM

## 2017-12-08 DIAGNOSIS — C3490 Malignant neoplasm of unspecified part of unspecified bronchus or lung: Secondary | ICD-10-CM

## 2017-12-08 LAB — CMP (CANCER CENTER ONLY)
ALT: 16 U/L (ref 0–55)
AST: 21 U/L (ref 5–34)
Albumin: 3.6 g/dL (ref 3.5–5.0)
Alkaline Phosphatase: 103 U/L (ref 40–150)
Anion gap: 11 (ref 3–11)
BUN: 35 mg/dL — ABNORMAL HIGH (ref 7–26)
CHLORIDE: 100 mmol/L (ref 98–109)
CO2: 30 mmol/L — ABNORMAL HIGH (ref 22–29)
Calcium: 10.6 mg/dL — ABNORMAL HIGH (ref 8.4–10.4)
Creatinine: 1.93 mg/dL — ABNORMAL HIGH (ref 0.60–1.10)
GFR, EST AFRICAN AMERICAN: 29 mL/min — AB (ref >60)
GFR, Estimated: 25 mL/min — ABNORMAL LOW (ref >60)
Glucose, Bld: 206 mg/dL — ABNORMAL HIGH (ref 70–140)
POTASSIUM: 3.3 mmol/L — AB (ref 3.5–5.1)
SODIUM: 141 mmol/L (ref 136–145)
Total Bilirubin: 0.4 mg/dL (ref 0.2–1.2)
Total Protein: 7.7 g/dL (ref 6.4–8.3)

## 2017-12-08 LAB — CBC WITH DIFFERENTIAL (CANCER CENTER ONLY)
Basophils Absolute: 0.1 10*3/uL (ref 0.0–0.1)
Basophils Relative: 1 %
Eosinophils Absolute: 0.2 10*3/uL (ref 0.0–0.5)
Eosinophils Relative: 3 %
HEMATOCRIT: 39.8 % (ref 34.8–46.6)
HEMOGLOBIN: 12.1 g/dL (ref 11.6–15.9)
LYMPHS ABS: 1 10*3/uL (ref 0.9–3.3)
LYMPHS PCT: 17 %
MCH: 23.5 pg — AB (ref 25.1–34.0)
MCHC: 30.4 g/dL — ABNORMAL LOW (ref 31.5–36.0)
MCV: 77.1 fL — AB (ref 79.5–101.0)
Monocytes Absolute: 0.7 10*3/uL (ref 0.1–0.9)
Monocytes Relative: 11 %
NEUTROS ABS: 4.1 10*3/uL (ref 1.5–6.5)
NEUTROS PCT: 68 %
Platelet Count: 200 10*3/uL (ref 145–400)
RBC: 5.16 MIL/uL (ref 3.70–5.45)
RDW: 17.7 % — ABNORMAL HIGH (ref 11.2–14.5)
WBC Count: 6 10*3/uL (ref 3.9–10.3)

## 2017-12-08 NOTE — Patient Outreach (Signed)
[facility]) Care Management  12/08/2017  Suleika Donavan Ybarbo 1946/04/07 [id number]   RN Health Coach Monthly Outreach  Referral Date:01/12/2017 Referral Source:[facility] RN Case Manager Reason for Referral:CHF education Insurance:Medicare   Outreach Attempt:  Outreach attempt #1 to patient for monthly follow up. No answer. RN Health Coach left HIPAA compliant voicemail message along with contact information.  Plan:  RN Health Coach will send unsuccessful outreach letter to patient.  RN Health Coach will make another outreach attempt to patient within 3-4 business days if no return call back from patient.  [place] [phone] [email or web address]

## 2017-12-08 NOTE — Therapy (Signed)
[place], [place], [address] Phone: [phone]   Fax:  [phone]  Physical Therapy Evaluation  Patient Details  Name: VALECIA BESKE MRN: [id number] Date of Birth: [birth year] Referring Provider: Dr. Curt Bears   Encounter Date: 12/08/2017  PT End of Session - 12/08/17 1557    Visit Number  1    Number of Visits  1    PT Start Time  1324    PT Stop Time  1445    PT Time Calculation (min)  27 min    Activity Tolerance  Patient tolerated treatment well    Behavior During Therapy  [facility] for tasks assessed/performed       Past Medical History:  Diagnosis Date  . Adenocarcinoma of lung, stage 1, right ([place]) 11/25/2017  . Anemia, iron deficiency    of chronic disease  . Aortic stenosis    a. Severe AS by echo 11/2012.  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 Asystole ([place])    a. During ENT surgery 2005: developed marked asystole requiring CPR, felt due to vagal reaction (cath nonobst dz).  . Carotid artery disease ([place])    a. Carotid Dopplers performed in August 2013 showed 40-59% left stenosis and 0-39% right; f/u recommended in 2 years.   . Cerebrovascular accident [facility]) 2009   a. LMCA infarct felt embolic 4010, maintained on chronic coumadin.; denies residual on 04/05/2013  . CHF (congestive heart failure) ([place])   . Cholelithiasis   . Chronic cough onset 03/2010   Dr Melvyn Novas  . Chronic Persistent Atrial Fibrillation 12/31/2008   Qualifier: Diagnosis of  By: [name]    . Coronary artery disease 05/2002   a. Ant MI 2003 s/p PTCA/stent to RCA.   . Diverticulosis of colon   . Esophagitis, reflux   . ESRD (end stage renal disease) ([place])    a. Mass on L kidney per pt s/p nephrectomy - pt states not cancer - [facility] notes indicate ESRD due to HTN/DM - was previously on HD. b. Kidney transplant 02/2011.  Marland Kitchen GERD (gastroesophageal reflux disease)   . Gout   . Helicobacter pylori  (H. pylori) infection    hx of  . Hemorrhoids   . Hx of colonic polyps    adenomatous  . Hyperlipidemia   . Hypertension   . Lung nodule seen on imaging study 04/07/2013   1.0 cm ground glass opacity RUL  . Myocardial infarction ([place]) 2003  . Pericardial effusion    a. Small by echo 11/2011.  Marland Kitchen Pruritic dermatitis    treated with steriods/UV light  . S/P aortic valve replacement with bioprosthetic valve and maze procedure 04/12/2013   26mm [facility] Ease bovine pericardial tissue valve   . S/P Maze operation for atrial fibrillation 04/12/2013   Complete bilateral atrial lesion set using bipolar radiofrequency and cryothermy ablation with clipping of LA appendage  . Sleep apnea    Pt says testing was positive, intolerant of CPAP.  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  . Type II diabetes mellitus ([place])   . Unspecified hearing loss     Past Surgical History:  Procedure Laterality Date  . AORTIC VALVE REPLACEMENT N/A 04/12/2013   Procedure: AORTIC VALVE REPLACEMENT (AVR);  Surgeon: Rexene Alberts, MD;  Location: [place];  Service:  Open Heart Surgery;  Laterality: N/A;  . ARTERIOVENOUS GRAFT PLACEMENT Left   . ARTERIOVENOUS GRAFT PLACEMENT Left    "I've had 2 on my left; had one removed" (04/05/2013)   . ARTERY EXPLORATION Right 04/11/2013   Procedure: ARTERY EXPLORATION;  Surgeon: Rexene Alberts, MD;  Location: [place];  Service: Open Heart Surgery;  Laterality: Right;  Right carotid artery exploration  . AV FISTULA PLACEMENT Right   . AV FISTULA REPAIR Right    "took it out" ((/18/2014)  . CARDIOVERSION  05/29/2012   Procedure: CARDIOVERSION;  Surgeon: Lelon Perla, MD;  [name] ENDOSCOPY;  Service: Cardiovascular;  Laterality: N/A;  . CHOLECYSTECTOMY  2009   with hernia removal  . CORONARY ANGIOPLASTY WITH  STENT PLACEMENT Right    coronary artery  . INSERTION OF DIALYSIS CATHETER Bilateral    "over the years; took them both out" (04/05/2013)  . INTRAOPERATIVE TRANSESOPHAGEAL ECHOCARDIOGRAM N/A 04/11/2013   Procedure: INTRAOPERATIVE TRANSESOPHAGEAL ECHOCARDIOGRAM;  Surgeon: Rexene Alberts, MD;  Location: [place];  Service: Open Heart Surgery;  Laterality: N/A;  . INTRAOPERATIVE TRANSESOPHAGEAL ECHOCARDIOGRAM N/A 04/12/2013   Procedure: INTRAOPERATIVE TRANSESOPHAGEAL ECHOCARDIOGRAM;  Surgeon: Rexene Alberts, MD;  Location: [place];  Service: Open Heart Surgery;  Laterality: N/A;  . KIDNEY TRANSPLANT  03/16/11  . LEFT AND RIGHT HEART CATHETERIZATION WITH CORONARY ANGIOGRAM N/A 04/06/2013   Procedure: LEFT AND RIGHT HEART CATHETERIZATION WITH CORONARY ANGIOGRAM;  Surgeon: Blane Ohara, MD;  Location: [facility] CATH LAB;  Service: Cardiovascular;  Laterality: N/A;  . MAZE N/A 04/12/2013   Procedure: MAZE;  Surgeon: Rexene Alberts, MD;  Location: [place];  Service: Open Heart Surgery;  Laterality: N/A;  . NASAL RECONSTRUCTION WITH SEPTAL REPAIR     "took it out" (04/05/2013)  . NEPHRECTOMY Left 2010   no CA on bx  . TONSILLECTOMY    . TOTAL ABDOMINAL HYSTERECTOMY    . TUBAL LIGATION      There were no vitals filed for this visit.   Subjective Assessment - 12/08/17 1545    Subjective  "I've been getting hand cramps and leg cramps." She says she did do cardiac rehab after her valve replacement surgery. "I have neuropathy in my feet."    Patient is accompained by:  Family member spouse    Pertinent History  Pt. presented for a follow-up from abnormal CT and PET scans; work-up showed a new nodular density in right upper lobe and one in left lower lobe. Diagnosis is adenocarcinoma, stage I. She may have SBRT treatment. Ex-smoker who quit 07/2001; s/p kidney transplant; CVA 2009; CHF; HTN; MI 2003; 2014 aortic valve replacement and atrial fibrillation treatment.    Patient Stated Goals  get info from all lung  clinic providers    Currently in Pain?  Yes    Pain Score  -- "small"    Pain Location  Hand    Pain Orientation  Right;Left left > right    Pain Descriptors / Indicators  Cramping    Aggravating Factors   unsure    Pain Relieving Factors  linament called "Pain Away" and 1/2 oxycodone intermittently taken         [facility] PT Assessment - 12/08/17 0001      Assessment   Medical Diagnosis  right upper lobe adenocarcinoma    Referring Provider  Dr. Curt Bears    Onset Date/Surgical Date  11/25/17    Prior Therapy  none      Precautions   Precautions  Fall;Other (comment)    Precaution Comments  cancer precautions      Restrictions   Weight Bearing Restrictions  No      Balance Screen   Has the patient fallen in the past 6 months  No    Has the patient had a decrease in activity level because of a fear of falling?   Yes    Is the patient reluctant to leave their home because of a fear of falling?   No      Home Film/video editor residence    Living Arrangements  Spouse/significant other    Type of [place]  Two level      Prior Function   Level of Independence  Independent    Leisure  no regular exercise, but does take care of 50 year-old great grandchild, does housework      Cognition   Overall Cognitive Status  Within Functional Limits for tasks assessed      Coordination   Gross Motor Movements are Fluid and Coordinated  Yes      Functional Tests   Functional tests  Sit to Stand      Sit to Stand   Comments  unable to stand without pushing off from her hands      Posture/Postural Control   Posture/Postural Control  Postural limitations    Postural Limitations  Forward head;Increased thoracic kyphosis      ROM / Strength   AROM / PROM / Strength  AROM      AROM   Overall AROM Comments  In standing, active trunk ROM: unable to do flexion because she is afraid of dizziness, same for extension; WFL for  sidebend and rotation bilat.      Ambulation/Gait   Ambulation/Gait  Yes    Ambulation/Gait Assistance  6: Modified independent (Device/Increase time)    Assistive device  Straight cane for general stability    Gait Comments  appears stiff at first, unsteady      Balance   Balance Assessed  Yes      Dynamic Standing Balance   Dynamic Standing - Comments  reaches forward 3 inches in standing; has fear of falling                Objective measurements completed on examination: See above findings.              PT Education - 12/08/17 1557    Education provided  Yes    Education Details  energy conservation, walking, "Why exercise?" flyer, CURE article on staying active, posture, breathing, PT info    Person(s) Educated  Patient;Spouse    Methods  Explanation;Handout    Comprehension  Verbalized understanding            Lung Clinic Goals - 12/08/17 1603      Patient will be able to verbalize understanding of the benefit of exercise to decrease fatigue.   Status  Achieved      Patient will be able to verbalize the importance of posture.   Status  Achieved      Patient will be able to demonstrate diaphragmatic breathing for improved lung function.   Status  Achieved      Patient will be able to verbalize understanding of the role of physical therapy to prevent functional decline and who to contact if physical therapy is needed.   Status  Achieved  Plan - 12/08/17 1557    Clinical Impression Statement  This is a pleasant woman with new diagnosis of stage I lung adenocarcinoma.  She may be treated with SBRT. She has posture and gait impairments as well as unsteadiness on feet. She cannot stand without pushing off with her hands.    History and Personal Factors relevant to plan of care:  h/o kidney transplant, CVA, cardiac surgery, DM    Clinical Presentation  Evolving    Clinical Presentation due to:  new lung cancer diagnosis and will start  treatment soon    Clinical Decision Making  Moderate    Rehab Potential  Good    PT Frequency  One time visit    PT Treatment/Interventions  Patient/family education    PT Next Visit Plan  No follow-up planned at this time    PT Home Exercise Plan  walking or other exercise, breathing exercise    Consulted and Agree with Plan of Care  Patient       Patient will benefit from skilled therapeutic intervention in order to improve the following deficits and impairments:  Decreased strength, Decreased balance, Postural dysfunction, Difficulty walking, Pain  Visit Diagnosis: Unsteadiness on feet - Plan: PT plan of care cert/re-cert  Other abnormalities of gait and mobility - Plan: PT plan of care cert/re-cert  Muscle weakness (generalized) - Plan: PT plan of care cert/re-cert  Abnormal posture - Plan: PT plan of care cert/re-cert     Problem List Patient Active Problem List   Diagnosis Date Noted  . Lung cancer ([place]) 11/30/2017  . Acute bronchitis 11/30/2017  . Adenocarcinoma of lung, stage 1, right ([place]) 11/25/2017  . Diabetes mellitus due to underlying condition with hyperglycemia, without long-term current use of insulin ([place])   . Acute respiratory failure with hypoxia ([place]) 12/27/2016  . Acute on chronic diastolic CHF (congestive heart failure) ([place]) 12/27/2016  . Left arm swelling 12/27/2016  . CKD (chronic kidney disease), stage III ([place]) 12/27/2016  . Hypertensive urgency 12/27/2016  . Pain of left breast 12/27/2016  . CHF exacerbation ([place]) 12/27/2016  . Arm muscle atrophy 11/16/2016  . Insomnia 02/25/2016  . Renal cyst 11/21/2015  . Renal mass, right 11/10/2015  . Skin abscess 08/13/2015  . Shoulder pain, right 08/13/2015  . Earache 11/11/2014  . Weight gain 08/12/2014  . Multinodular goiter 05/01/2014  . Foot pain, bilateral 04/12/2014  . Encounter for therapeutic drug monitoring 08/14/2013  . S/P AVR (aortic valve replacement) 05/14/2013  . S/P aortic valve  replacement with bioprosthetic valve and maze procedure 04/12/2013  . S/P Maze operation for atrial fibrillation 04/12/2013  . Nodule of right lung 04/07/2013  . Lung nodule seen on imaging study 04/07/2013  . Pain in limb 05/22/2012  . Cramps, muscle, general 02/29/2012  . Dermatitis 01/26/2012  . Knee pain 01/26/2012  . Wrist pain 01/26/2012  . Long term (current) use of anticoagulants 08/17/2011  . S/P kidney transplant 03/16/2011  . Knee pain, right 02/23/2011  . Low back pain 02/23/2011  . Hypersalivation 11/24/2010  . AORTIC STENOSIS 08/20/2010  . DISTURBANCE OF SALIVARY SECRETION 07/28/2010  . COAGULOPATHY 05/27/2010  . NAUSEA 04/14/2010  . DYSPHAGIA, PHARYNGOESOPHAGEAL PHASE 03/10/2010  . End stage renal disease ([place]) 12/09/2009  . SMOKER 10/07/2009  . ALOPECIA 10/07/2009  . SKIN RASH 10/07/2009  . CAROTID STENOSIS 03/04/2009  . PRURITUS 03/04/2009  . STREP INF CCE & UNS SITE GROUP D [ENTEROCOCCUS] 12/31/2008  . ANEMIA 12/31/2008  . Chronic Persistent Atrial Fibrillation  12/31/2008  . ECCHYMOSES 10/29/2008  . Cough 08/27/2008  . ABDOMINAL PAIN 08/27/2008  . NEOPLASM, MALIGNANT, KIDNEY 07/02/2008  . APHASIA DUE TO CEREBROVASCULAR DISEASE 07/02/2008  . Chronic fatigue 05/21/2008  . HYPERLIPIDEMIA 09/27/2007  . PERICARDIAL EFFUSION 09/27/2007  . CEREBROVASCULAR ACCIDENT 09/27/2007  . CONSTIPATION 09/27/2007  . SLEEP APNEA 09/27/2007  . CHOLELITHIASIS 06/09/2007  . ESOPHAGITIS, REFLUX 06/08/2007  . DUODENITIS, WITHOUT HEMORRHAGE 06/08/2007  . HELICOBACTER PYLORI INFECTION, HX OF 06/08/2007  . DM type 2 causing renal disease ([place]) 05/23/2007  . Gout 05/23/2007  . 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 MYOCARDIAL INFARCTION, HX OF 05/23/2007  . Coronary atherosclerosis 05/23/2007  . Congestive heart failure ([place]) 05/23/2007  . GERD 05/23/2007  . COLONIC POLYPS, HX OF 05/23/2007  . DIVERTICULOSIS, COLON 07/01/2005    Remee Charley 12/08/2017, 4:04  PM  [place], [place], [address] Phone: [phone]   Fax:  [phone]  Name: CASARA PERRIER MRN: [id number] Date of Birth: 05/12/1946  Serafina Royals, PT 12/08/17 4:05 PM

## 2017-12-08 NOTE — Telephone Encounter (Signed)
Printed avs and calender of upcoming appointment. Per 5/23 los 

## 2017-12-08 NOTE — Progress Notes (Signed)
Radiation Oncology         [phone] ________________________________  Multidisciplinary Thoracic Oncology Clinic [facility]) Initial Outpatient Consultation  Name: [name] MRN: [id number]  Date: 12/08/2017  DOB: July 26, 1945  YI:RSWNIOEVO, [name] Lacks, MD  No ref. provider found   REFERRING PHYSICIAN: No ref. provider found  DIAGNOSIS: 72 y.o. female with Clinical Stage I NSCLC (well-differentiated adenocarcinoma) The encounter diagnosis was Adenocarcinoma of lung, stage 1, right ([place]).    ICD-10-CM   1. Adenocarcinoma of lung, stage 1, right ([place]) C34.91     HISTORY OF PRESENT ILLNESS::[name] is a 72 y.o. female who is here for a recent diagnosis of lung cancer, accompanied by her husband. She is an established patient of Dr. Guy Sandifer for coronary artery disease, persistent atrial fibrillation, and chronic diastolic congestive heart failure status post aortic valve replacement in 2014. She states that the lung lesion was first seen at that time. Most recent CT of the chest on 10/11/17 showed enlarging and increasingly solid 1.4 x 1.3 cm right upper lobe lesion compared with prior studies. There was also a small ground-glass density more posteriorly in the right upper lobe and a small solid nodule in the superior segment of the left lower lobe that have only slightly enlarged. No adenopathy.  PET scan on 11/03/17 showed 1.4 cm sub-solid pulmonary nodule with low-grade FDG activity, suspicious for low-grade adenocarcinoma. The sub-cm right upper lobe and left lower lobe pulmonary nodules showed no FDG uptake but are too small to characterize by PET. No evidence of metastatic disease.  The patient proceeded to CT biopsy of the right upper lobe lesion on 11/25/17 which revealed well-differentiated adenocarcinoma.  The patient was referred today for presentation in the multidisciplinary conference.  Radiology studies and pathology slides were presented there for review and discussion of  treatment options.  A consensus was discussed regarding potential next steps.  On review of systems, the patient reports cough. She reports an episode of hemoptysis following her biopsy but has not had any since. She reports decreased range of motion and strength in her right shoulder.  Of note, the patient has chronic kidney disease status post left nephrectomy in 2010 and transplantation in 2012, now on long-term immunosuppression.  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 pylori) infection, Hemorrhoids, colonic polyps, Hyperlipidemia, Hypertension, Lung nodule seen on imaging study (04/07/2013), Myocardial infarction ([place]) (2003), Pericardial effusion, Pruritic dermatitis, S/P aortic valve replacement with bioprosthetic valve and maze procedure (04/12/2013), S/P Maze operation for atrial fibrillation (04/12/2013), Sleep apnea, Streptococcal infection group D enterococcus, Type II diabetes mellitus ([place]), and Unspecified hearing loss.    PAST SURGICAL HISTORY: Past Surgical History:  Procedure Laterality Date  . AORTIC VALVE REPLACEMENT N/A 04/12/2013   Procedure: AORTIC VALVE REPLACEMENT (AVR);  Surgeon: Rexene Alberts, MD;  Location: [place];  Service: Open Heart Surgery;  Laterality: N/A;  . ARTERIOVENOUS GRAFT PLACEMENT Left   . ARTERIOVENOUS GRAFT PLACEMENT Left    "I've had 2 on my left; had one  removed" (04/05/2013)   . ARTERY  EXPLORATION Right 04/11/2013   Procedure: ARTERY EXPLORATION;  Surgeon: Rexene Alberts, MD;  Location: [place];  Service: Open Heart Surgery;  Laterality: Right;  Right carotid artery exploration  . AV FISTULA PLACEMENT Right   . AV FISTULA REPAIR Right    "took it out" ((/18/2014)  . CARDIOVERSION  05/29/2012   Procedure: CARDIOVERSION;  Surgeon: Lelon Perla, MD;  Location: [facility] ENDOSCOPY;  Service: Cardiovascular;  Laterality: N/A;  . CHOLECYSTECTOMY  2009   with hernia removal  . CORONARY ANGIOPLASTY WITH STENT PLACEMENT Right    coronary artery  . INSERTION OF DIALYSIS CATHETER Bilateral    "over the years; took them both out" (04/05/2013)  . INTRAOPERATIVE TRANSESOPHAGEAL ECHOCARDIOGRAM N/A 04/11/2013   Procedure: INTRAOPERATIVE TRANSESOPHAGEAL ECHOCARDIOGRAM;  Surgeon: Rexene Alberts, MD;  Location: [place];  Service: Open Heart Surgery;  Laterality: N/A;  . INTRAOPERATIVE TRANSESOPHAGEAL ECHOCARDIOGRAM N/A 04/12/2013   Procedure: INTRAOPERATIVE TRANSESOPHAGEAL ECHOCARDIOGRAM;  Surgeon: Rexene Alberts, MD;  Location: [place];  Service: Open Heart Surgery;  Laterality: N/A;  . KIDNEY TRANSPLANT  03/16/11  . LEFT AND RIGHT HEART CATHETERIZATION WITH CORONARY ANGIOGRAM N/A 04/06/2013   Procedure: LEFT AND RIGHT HEART CATHETERIZATION WITH CORONARY ANGIOGRAM;  Surgeon: Blane Ohara, MD;  Location: [facility] CATH LAB;  Service: Cardiovascular;  Laterality: N/A;  . MAZE N/A 04/12/2013   Procedure: MAZE;  Surgeon: Rexene Alberts, MD;  Location: [place];  Service: Open Heart Surgery;  Laterality: N/A;  . NASAL RECONSTRUCTION WITH SEPTAL REPAIR     "took it out" (04/05/2013)  . NEPHRECTOMY Left 2010   no CA on bx  . TONSILLECTOMY    . TOTAL ABDOMINAL HYSTERECTOMY    . TUBAL LIGATION      FAMILY HISTORY: family history includes Diabetes in her maternal grandmother, other, and son; Hypertension in her mother; Stroke in her father.  SOCIAL HISTORY:  reports that she  quit smoking about 16 years ago. Her smoking use included cigarettes. She has a 30.00 pack-year smoking history. She has never used smokeless tobacco. She reports that she does not drink alcohol or use drugs.  ALLERGIES: Ibuprofen; Sulfamethoxazole-trimethoprim; Sulfonamide derivatives; Tape; Tramadol; Doxycycline; Hydrocil [psyllium]; Bactrim; and Red dye  MEDICATIONS:  Current Outpatient Medications  Medication Sig Dispense Refill  . amLODipine (NORVASC) 10 MG tablet Take 5 mg by mouth daily. TAKE 1/2 Tablet by mouth daily     . amoxicillin (AMOXIL) 500 MG capsule TAKE 4 CAPSULES BY MOUTH 1 HOUR PRIOR TO DENTAL APPOINTMENT (Patient not taking: Reported on 12/08/2017) 12 capsule 1  . cefUROXime (CEFTIN) 250 MG tablet Take 1 tablet (250 mg total) by mouth 2 (two) times daily for 14 days. 14 tablet 0  . dapsone 25 MG tablet Take 25 mg by mouth daily.     . diphenhydrAMINE (BENADRYL) 50 MG capsule Take 1 capsule (50 mg total) by mouth every 6 (six) hours as needed for itching. 90 capsule 3  . furosemide (LASIX) 20 MG tablet Take 60 mg by mouth 2 (two) times daily.   10  . gabapentin (NEURONTIN) 100 MG capsule Take 100 mg by mouth 3 (three) times daily.    Marland Kitchen glucose blood (ONETOUCH VERIO) test strip USE AS INSTRUCTED 4 TIMES A DAY (BEFORE MEALS) & AT BEDTIME (ICD 10-E11.9) 150 each 3  . glucose blood (ONETOUCH VERIO) test strip 1 each by Other route 3 (three) times daily as needed for other. Dx E11.9 150 each 5  . HUMALOG KWIKPEN 100 UNIT/ML KiwkPen IF  SUGAR:140-200 USE 8 UNITS,201-230 10 UNITS,231-260 12 UNITS,261-300 14 UNITS,>301 16 UNITS 27 pen 3  . Insulin Pen Needle (B-D UF III MINI PEN NEEDLES) 31G X 5 MM MISC USE TO ADMINISTER INSULIN FOUR TIMES A DAY DX E11.9 100 each 1  . KLOR-CON M20 20 MEQ tablet TAKE 2 TABLETS (40 MEQ TOTAL) BY MOUTH DAILY. (Patient taking differently: Take 20 mEq by mouth daily. ) 60 tablet 5  . levothyroxine (SYNTHROID, LEVOTHROID) 25 MCG tablet TAKE 2 TABLETS (50 MCG  TOTAL) BY MOUTH DAILY. 60 tablet 2  . lisinopril (PRINIVIL,ZESTRIL) 5 MG tablet Take 5 mg by mouth at bedtime.     . metoprolol tartrate (LOPRESSOR) 25 MG tablet TAKE 1 TABLET BY MOUTH TWICE A DAY 180 tablet 1  . Multiple Vitamins-Minerals (CENTRUM SILVER 50+WOMEN) TABS Take 1 tablet by mouth daily with breakfast.    . mycophenolate (MYFORTIC) 180 MG EC tablet Take 360 mg by mouth 2 (two) times daily.     [name] DELICA LANCETS 57Q MISC Use to check blood sugars three times a day DX E11.9 100 each 5  . oxyCODONE-acetaminophen (ROXICET) 5-325 MG tablet Take 0.5-1 tablets by mouth every 6 (six) hours as needed for severe pain. 100 tablet 0  . rosuvastatin (CRESTOR) 20 MG tablet Take 1 tablet (20 mg total) by mouth daily. 30 tablet 11  . tacrolimus (PROGRAF) 1 MG capsule Take 3 mg by mouth 2 (two) times daily. Take 3 tablets by mouth twice daily     . warfarin (COUMADIN) 5 MG tablet Take 5-7.5 mg by mouth See admin instructions. 7.5 mg on Monday and Friday, 5 mg all other days     No current facility-administered medications for this encounter.     REVIEW OF SYSTEMS:  REVIEW OF SYSTEMS: A 10+ POINT REVIEW OF SYSTEMS WAS OBTAINED including neurology, dermatology, psychiatry, cardiac, respiratory, lymph, extremities, GI, GU, musculoskeletal, constitutional, reproductive, HEENT. All pertinent positives are noted in the HPI. All others are negative.   PHYSICAL EXAM:  Vitals with BMI 12/08/2017  Height 5\' 1"   Weight 165 lbs 5 oz  BMI 46.96  Systolic 295  Diastolic 71  Pulse 67  Respirations 17   General: Alert and oriented, in no acute distress. HEENT: Head is normocephalic. Extraocular movements are intact. Oropharynx is clear. Neck: Neck is supple, no palpable cervical or supraclavicular lymphadenopathy. Heart: Regular in rate and rhythm with no murmurs, rubs, or gallops. Chest: Clear to auscultation bilaterally, with no rhonchi, wheezes, or rales. Abdomen: Soft, nontender, nondistended,  with no rigidity or guarding. Extremities: No cyanosis or edema. Lymphatics: see Neck Exam Skin: Scar along her chest from heart surgery. Scar along the abdomen and flank area from prior surgeries for kidney issues. Musculoskeletal: Symmetric strength and muscle tone throughout. Neurologic: No obvious focalities. Speech is fluent. Coordination is intact. Psychiatric: Judgment and insight are intact. Affect is appropriate.   KPS = 90  100 - Normal; no complaints; no evidence of disease. 90   - Able to carry on normal activity; minor signs or symptoms of disease. 80   - Normal activity with effort; some signs or symptoms of disease. 30   - Cares for self; unable to carry on normal activity or to do active work. 60   - Requires occasional assistance, but is able to care for most of his personal needs. 50   - Requires considerable assistance and frequent medical care. 8   - Disabled; requires special care and assistance. 30   -  Severely disabled; hospital admission is indicated although death not imminent. 23   - Very sick; hospital admission necessary; active supportive treatment necessary. 10   - Moribund; fatal processes progressing rapidly. 0     - Dead  Karnofsky DA, Abelmann [place], Craver LS and Burchenal [facility] [phone]) The use of the nitrogen mustards in the palliative treatment of carcinoma: with particular reference to bronchogenic carcinoma Cancer 1 [id number]  LABORATORY DATA:  Lab Results  Component Value Date   WBC 6.0 12/08/2017   HGB 12.1 12/08/2017   HCT 39.8 12/08/2017   MCV 77.1 (L) 12/08/2017   PLT 200 12/08/2017   Lab Results  Component Value Date   NA 141 12/08/2017   K 3.3 (L) 12/08/2017   CL 100 12/08/2017   CO2 30 (H) 12/08/2017   Lab Results  Component Value Date   ALT 16 12/08/2017   AST 21 12/08/2017   ALKPHOS 103 12/08/2017   BILITOT 0.4 12/08/2017    PULMONARY FUNCTION TEST:   Recent Review Flowsheet Data    There is no flowsheet data to display.       RADIOGRAPHY: Ct Biopsy  Result Date: 11/25/2017 INDICATION: Indeterminate right upper lobe pulmonary nodule. Please perform CT-guided biopsy for tissue diagnostic purposes. EXAM: CT-GUIDED BIOPSY OF INDETERMINATE RIGHT UPPER LOBE PULMONARY NODULE COMPARISON:  PET-CT - 11/03/2017; chest CT - 10/11/2017 MEDICATIONS: None. ANESTHESIA/SEDATION: Fentanyl 50 mcg IV; Versed 0.5 mg IV Sedation time: 15 minutes; The patient was continuously monitored during the procedure by the interventional radiology nurse under my direct supervision. CONTRAST:  None COMPLICATIONS: SIR Level A - No therapy, no consequence. Procedure complicated by solitary episode of self-limited hemoptysis not requiring dedicated intervention. PROCEDURE: Informed consent was obtained from the patient following an explanation of the procedure, risks, benefits and alternatives. The patient understands,agrees and consents for the procedure. All questions were addressed. A time out was performed prior to the initiation of the procedure. The patient was positioned right lateral decubitus on the CT table and a limited chest CT was performed for procedural planning demonstrating unchanged size of the approximately 1.5 x 1.4 cm ground-glass nodular opacity within the right lung apex (image 17, series 2. The operative site was prepped and draped in the usual sterile fashion. Under sterile conditions and local anesthesia, a 17 gauge coaxial needle was advanced into the peripheral aspect of the nodule. Positioning was confirmed with intermittent CT fluoroscopy and followed by the acquisition of solitary core needle biopsy with an 18 gauge core needle biopsy device. Following the acquisition of the core needle biopsy, patient experienced a solitary episode of self-limited hemoptysis and as such, additional samples were not obtained. The coaxial needle was removed following deployment of a [employer] plug and superficial hemostasis was achieved with manual  compression. Patient was maintained right lateral decubitus until postprocedural radiograph was obtained. Limited post procedural chest CT was negative for pneumothorax or additional complication. A dressing was placed. The patient tolerated the procedure well without immediate postprocedural complication. The patient was escorted to have an upright chest radiograph. IMPRESSION: Technically successful CT guided core needle core biopsy of indeterminate right upper lobe pulmonary nodule complicated by development of solitary episode of self-limited hemoptysis. Electronically Signed   By: Sandi Mariscal M.D.   On: 11/25/2017 13:21   Dg Chest Port 1 View  Result Date: 11/25/2017 CLINICAL DATA:  Post right upper lobe pulmonary nodule biopsy EXAM: PORTABLE CHEST 1 VIEW COMPARISON:  CT-guided right upper lobe pulmonary nodule biopsy-earlier same day; PET-CT-11/03/2017;  chest CT-10/11/2017; chest radiograph-01/07/2017; 12/27/2016 FINDINGS: Grossly unchanged enlarged cardiac silhouette and mediastinal contours post median sternotomy and valve replacement. Atherosclerotic plaque within the thoracic aorta. Mild pulmonary venous congestion without frank evidence of edema, grossly unchanged. There is persistent pleuroparenchymal thickening about the right minor fissure. Known ill-defined right upper lobe pulmonary nodule is not well demonstrated on the present examination. No new focal airspace opacities. No pleural effusion or pneumothorax. No acute osseus abnormalities. IMPRESSION: 1. No evidence of delayed complication following right upper lobe pulmonary nodule biopsy. Specifically, no evidence of pneumothorax. 2. Similar findings of cardiomegaly and pulmonary venous congestion. Electronically Signed   By: Sandi Mariscal M.D.   On: 11/25/2017 13:20      IMPRESSION: Clinical Stage I NSCLC (well-differentiated adenocarcinoma)  Patient would be a good candidate for stereotactic body radiation therapy for treatment of this  right upper lobe  lesion. Due to the patient's other significant medical issues, she is not felt to be a good candidate for surgery. We discussed the general course of SBRT, potential side effects, and toxicities with radiation with the patient and her husband. The patient appears to understand and would like to proceed with this course of treatment.   The patient does have some smaller nodules in the lungs, and these will be followed on subsequent chest CT scan in approximately 6 months.  PLAN: Patient will proceed with SBRT simulation on May 29 at 2:00 PM with treatments to begin approximately 1 week later. Anticipate 3-5 treatments.      ------------------------------------------------  Blair Promise, PhD, MD  This document serves as a record of services personally performed by Gery Pray, MD. It was created on his behalf by Rae Lips, a trained medical scribe. The creation of this record is based on the scribe's personal observations and the provider's statements to them. This document has been checked and approved by the attending provider.

## 2017-12-08 NOTE — Progress Notes (Signed)
[place] [phone]   Fax:([phone] Multidisciplinary thoracic oncology clinic  CONSULT NOTE  REFERRING PHYSICIAN: Dr. Darylene [name]  REASON FOR CONSULTATION:  72 years old African-American female recently diagnosed with lung cancer.  HPI [name] is a 72 y.o. female with past medical history significant for multiple medical problems including history of coronary artery disease, congestive heart failure, stroke in 2009, atrial fibrillation, end-stage renal disease status post renal transplant in 2012, GERD, hypertension, dyslipidemia, diabetes mellitus, aortic stenosis as well as iron deficiency anemia.  The patient also has a history for smoking but quit in 2003.  She also has CABG under the care of Dr. Roxy [name] few years ago.  At that time she was noted to have groundglass nodules in her lung.  She was followed by observation and a repeat CT scan of the chest on October 11, 2017 showed enlarging and increasingly solid part right upper lobe lesion compared with prior study from 2017 suspicious for adenocarcinoma.  There was also a smaller groundglass density more posteriorly in the right upper lobe that has only slightly enlarged and a small solid nodule in the superior segment of the left lower lobe that also has mildly enlarged.  There was no evidence of adenopathy.  A PET scan was performed on 11/03/2017 and that showed 1.4 cm sub-solid pulmonary nodule with low-grade FDG activity suspicious for low-grade adenocarcinoma.  The subcentimeter right upper lobe and left lower lobe pulmonary nodules showed no FDG uptake but they are too small to characterize by PET.  There was no evidence of metastatic disease. On 11/25/2017 the patient underwent CT-guided core biopsy of the right upper lobe pulmonary nodule by interventional radiology and the final pathology ([id number]) was consistent with adenocarcinoma, well-differentiated. The patient is not a good surgical  candidate for resection because of her comorbidities and Dr. Roxy [name] kindly referred her to the multidisciplinary thoracic oncology clinic for evaluation and discussion of her treatment options. When seen today she is feeling fine except for lack of energy and shortness of breath with exertion.  She denied having any chest pain, cough or hemoptysis.  She denied having any nausea, vomiting, diarrhea or constipation.  She has no fever or chills.  She denied having any recent weight loss or night sweats.  She has no headache or visual changes. Family history significant for father with stroke, mother had hypertension and leukemia and died at age 10. The patient is married and has 5 children.  She was accompanied today by her husband [name].  She used to work in Personal assistant.  She has a history for smoking for 30 years and quit in 2003.  She drinks alcohol occasionally and no history of drug abuse.  HPI  Past Medical History:  Diagnosis Date  . Adenocarcinoma of lung, stage 1, right ([place]) 11/25/2017  . Anemia, iron deficiency    of chronic disease  . Aortic stenosis    a. Severe AS by echo 11/2012.  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 Asystole ([place])    a. During ENT surgery 2005: developed marked asystole requiring CPR, felt due to vagal reaction (cath nonobst dz).  . Carotid artery disease ([place])    a. Carotid Dopplers performed in August 2013 showed 40-59% left stenosis and 0-39% right; f/u recommended in 2 years.   . Cerebrovascular accident [facility]) 2009   a. LMCA infarct felt embolic 1191, maintained on chronic coumadin.; denies residual  on 04/05/2013  . CHF (congestive heart failure) ([place])   . Cholelithiasis   . Chronic cough onset 03/2010   Dr Melvyn Novas  . Chronic Persistent Atrial Fibrillation 12/31/2008   Qualifier: Diagnosis of  By: [name]    . Coronary artery disease 05/2002   a. Ant MI 2003 s/p PTCA/stent to RCA.   . Diverticulosis of colon   . Esophagitis, reflux     . ESRD (end stage renal disease) ([place])    a. Mass on L kidney per pt s/p nephrectomy - pt states not cancer - [facility] notes indicate ESRD due to HTN/DM - was previously on HD. b. Kidney transplant 02/2011.  Marland Kitchen GERD (gastroesophageal reflux disease)   . Gout   . Helicobacter pylori (H. pylori) infection    hx of  . Hemorrhoids   . Hx of colonic polyps    adenomatous  . Hyperlipidemia   . Hypertension   . Lung nodule seen on imaging study 04/07/2013   1.0 cm ground glass opacity RUL  . Myocardial infarction ([place]) 2003  . Pericardial effusion    a. Small by echo 11/2011.  Marland Kitchen Pruritic dermatitis    treated with steriods/UV light  . S/P aortic valve replacement with bioprosthetic valve and maze procedure 04/12/2013   33mm [facility] Ease bovine pericardial tissue valve   . S/P Maze operation for atrial fibrillation 04/12/2013   Complete bilateral atrial lesion set using bipolar radiofrequency and cryothermy ablation with clipping of LA appendage  . Sleep apnea    Pt says testing was positive, intolerant of CPAP.  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  . Type II diabetes mellitus ([place])   . Unspecified hearing loss     Past Surgical History:  Procedure Laterality Date  . AORTIC VALVE REPLACEMENT N/A 04/12/2013   Procedure: AORTIC VALVE REPLACEMENT (AVR);  Surgeon: [name] Alberts, MD;  Location: [place];  Service: Open Heart Surgery;  Laterality: N/A;  . ARTERIOVENOUS GRAFT PLACEMENT Left   . ARTERIOVENOUS GRAFT PLACEMENT Left    "I've had 2 on my left; had one removed" (04/05/2013)   . ARTERY EXPLORATION Right 04/11/2013   Procedure: ARTERY EXPLORATION;  Surgeon: [name] Alberts, MD;  Location: [place];  Service: Open Heart Surgery;  Laterality: Right;  Right carotid artery exploration  . AV FISTULA PLACEMENT Right   . AV FISTULA  REPAIR Right    "took it out" ((/18/2014)  . CARDIOVERSION  05/29/2012   Procedure: CARDIOVERSION;  Surgeon: Lelon Perla, MD;  Location: [facility] ENDOSCOPY;  Service: Cardiovascular;  Laterality: N/A;  . CHOLECYSTECTOMY  2009   with hernia removal  . CORONARY ANGIOPLASTY WITH STENT PLACEMENT Right    coronary artery  . INSERTION OF DIALYSIS CATHETER Bilateral    "over the years; took them both out" (04/05/2013)  . INTRAOPERATIVE TRANSESOPHAGEAL ECHOCARDIOGRAM N/A 04/11/2013   Procedure: INTRAOPERATIVE TRANSESOPHAGEAL ECHOCARDIOGRAM;  Surgeon: [name] Alberts, MD;  Location: [place];  Service: Open Heart Surgery;  Laterality: N/A;  . INTRAOPERATIVE TRANSESOPHAGEAL ECHOCARDIOGRAM N/A 04/12/2013   Procedure: INTRAOPERATIVE TRANSESOPHAGEAL ECHOCARDIOGRAM;  Surgeon: [name] Alberts, MD;  Location: [place];  Service: Open Heart Surgery;  Laterality: N/A;  . KIDNEY TRANSPLANT  03/16/11  . LEFT AND RIGHT HEART CATHETERIZATION WITH CORONARY ANGIOGRAM N/A 04/06/2013   Procedure: LEFT AND RIGHT HEART CATHETERIZATION WITH CORONARY ANGIOGRAM;  Surgeon: Legrand Como  Ree Kida, MD;  Location: [facility] CATH LAB;  Service: Cardiovascular;  Laterality: N/A;  . MAZE N/A 04/12/2013   Procedure: MAZE;  Surgeon: [name] Alberts, MD;  Location: [place];  Service: Open Heart Surgery;  Laterality: N/A;  . NASAL RECONSTRUCTION WITH SEPTAL REPAIR     "took it out" (04/05/2013)  . NEPHRECTOMY Left 2010   no CA on bx  . TONSILLECTOMY    . TOTAL ABDOMINAL HYSTERECTOMY    . TUBAL LIGATION      Family History  Problem Relation Age of Onset  . Stroke Father   . Hypertension Mother   . Diabetes Other   . Diabetes Maternal Grandmother   . Diabetes Son   . Breast cancer Neg Hx     Social History Social History   Tobacco Use  . Smoking status: Former Smoker    Packs/day: 1.00    Years: 30.00    Pack years: 30.00    Types: Cigarettes    Last attempt to quit: 07/19/2001    Years since quitting: 16.4  . Smokeless tobacco: Never Used    Substance Use Topics  . Alcohol use: No  . Drug use: No    Allergies  Allergen Reactions  . Ibuprofen Nausea And Vomiting  . Sulfamethoxazole-Trimethoprim Itching, Swelling and Rash    Swelling of the face  . Sulfonamide Derivatives Itching, Swelling and Rash    Swelling of the face  . Tape Rash    Paper tape is ok  . Tramadol Nausea And Vomiting  . Doxycycline     nausea  . Hydrocil [Psyllium] Nausea And Vomiting  . Bactrim Itching, Swelling and Rash  . Red Dye Itching and Rash    Current Outpatient Medications  Medication Sig Dispense Refill  . amLODipine (NORVASC) 10 MG tablet Take 5 mg by mouth daily. TAKE 1/2 Tablet by mouth daily     . amoxicillin (AMOXIL) 500 MG capsule TAKE 4 CAPSULES BY MOUTH 1 HOUR PRIOR TO DENTAL APPOINTMENT 12 capsule 1  . cefUROXime (CEFTIN) 250 MG tablet Take 1 tablet (250 mg total) by mouth 2 (two) times daily for 14 days. 14 tablet 0  . dapsone 25 MG tablet Take 25 mg by mouth daily.     . diphenhydrAMINE (BENADRYL) 50 MG capsule Take 1 capsule (50 mg total) by mouth every 6 (six) hours as needed for itching. 90 capsule 3  . furosemide (LASIX) 20 MG tablet Take 60 mg by mouth 2 (two) times daily.   10  . gabapentin (NEURONTIN) 100 MG capsule Take 100 mg by mouth 3 (three) times daily.    Marland Kitchen glucose blood (ONETOUCH VERIO) test strip USE AS INSTRUCTED 4 TIMES A DAY (BEFORE MEALS) & AT BEDTIME (ICD 10-E11.9) 150 each 3  . glucose blood (ONETOUCH VERIO) test strip 1 each by Other route 3 (three) times daily as needed for other. Dx E11.9 150 each 5  . HUMALOG KWIKPEN 100 UNIT/ML KiwkPen IF SUGAR:140-200 USE 8 UNITS,201-230 10 UNITS,231-260 12 UNITS,261-300 14 UNITS,>301 16 UNITS 27 pen 3  . Insulin Pen Needle (B-D UF III MINI PEN NEEDLES) 31G X 5 MM MISC USE TO ADMINISTER INSULIN FOUR TIMES A DAY DX E11.9 100 each 1  . KLOR-CON M20 20 MEQ tablet TAKE 2 TABLETS (40 MEQ TOTAL) BY MOUTH DAILY. 60 tablet 5  . levothyroxine (SYNTHROID, LEVOTHROID) 25 MCG  tablet TAKE 2 TABLETS (50 MCG TOTAL) BY MOUTH DAILY. 60 tablet 2  . lisinopril (PRINIVIL,ZESTRIL) 5 MG tablet Take  5 mg by mouth at bedtime.     . metoprolol tartrate (LOPRESSOR) 25 MG tablet TAKE 1 TABLET BY MOUTH TWICE A DAY 180 tablet 1  . Multiple Vitamins-Minerals (CENTRUM SILVER 50+WOMEN) TABS Take 1 tablet by mouth daily with breakfast.    . mycophenolate (MYFORTIC) 180 MG EC tablet Take 360 mg by mouth 2 (two) times daily.     [name] DELICA LANCETS 59D MISC Use to check blood sugars three times a day DX E11.9 100 each 5  . oxyCODONE-acetaminophen (ROXICET) 5-325 MG tablet Take 0.5-1 tablets by mouth every 6 (six) hours as needed for severe pain. 100 tablet 0  . rosuvastatin (CRESTOR) 20 MG tablet Take 1 tablet (20 mg total) by mouth daily. 30 tablet 11  . tacrolimus (PROGRAF) 1 MG capsule Take 3 mg by mouth 2 (two) times daily. Take 3 tablets by mouth twice daily     . warfarin (COUMADIN) 5 MG tablet Take 5-7.5 mg by mouth See admin instructions. 7.5 mg on Monday and Friday, 5 mg all other days     No current facility-administered medications for this visit.     Review of Systems  Constitutional: positive for fatigue Eyes: negative Ears, nose, mouth, throat, and face: negative Respiratory: positive for dyspnea on exertion Cardiovascular: negative Gastrointestinal: negative Genitourinary:negative Integument/breast: negative Hematologic/lymphatic: negative Musculoskeletal:negative Neurological: negative Behavioral/Psych: negative Endocrine: negative Allergic/Immunologic: negative  Physical Exam  GLO:VFIEP, healthy, no distress, well nourished and well developed SKIN: skin color, texture, turgor are normal, no rashes or significant lesions HEAD: Normocephalic, No masses, lesions, tenderness or abnormalities EYES: normal, PERRLA, Conjunctiva are pink and non-injected EARS: External ears normal, Canals clear OROPHARYNX:no exudate, no erythema and lips, buccal mucosa, and  tongue normal  NECK: supple, no adenopathy, no JVD LYMPH:  no palpable lymphadenopathy, no hepatosplenomegaly BREAST:not examined LUNGS: clear to auscultation , and palpation HEART: regular rate & rhythm, no murmurs and no gallops ABDOMEN:abdomen soft, non-tender, normal bowel sounds and no masses or organomegaly BACK: Back symmetric, no curvature., No CVA tenderness EXTREMITIES:no joint deformities, effusion, or inflammation, no edema  NEURO: alert & oriented x 3 with fluent speech, no focal motor/sensory deficits  PERFORMANCE STATUS: ECOG 1  LABORATORY DATA: Lab Results  Component Value Date   WBC 6.0 12/08/2017   HGB 12.1 12/08/2017   HCT 39.8 12/08/2017   MCV 77.1 (L) 12/08/2017   PLT 200 12/08/2017      Chemistry      Component Value Date/Time   NA 138 09/02/2017 1552   K 3.8 09/02/2017 1552   CL 101 09/02/2017 1552   CO2 30 09/02/2017 1552   BUN 30 (H) 09/02/2017 1552   CREATININE 1.65 (H) 09/02/2017 1552   CREATININE 1.35 (H) 03/30/2013 1634      Component Value Date/Time   CALCIUM 9.7 09/02/2017 1552   CALCIUM 9.7 01/29/2008 1338   ALKPHOS 86 09/02/2017 1552   AST 14 09/02/2017 1552   ALT 12 09/02/2017 1552   BILITOT 0.5 09/02/2017 1552       RADIOGRAPHIC STUDIES: Ct Biopsy  Result Date: Nov 30, 2017 INDICATION: Indeterminate right upper lobe pulmonary nodule. Please perform CT-guided biopsy for tissue diagnostic purposes. EXAM: CT-GUIDED BIOPSY OF INDETERMINATE RIGHT UPPER LOBE PULMONARY NODULE COMPARISON:  PET-CT - 11/03/2017; chest CT - 10/11/2017 MEDICATIONS: None. ANESTHESIA/SEDATION: Fentanyl 50 mcg IV; Versed 0.5 mg IV Sedation time: 15 minutes; The patient was continuously monitored during the procedure by the interventional radiology nurse under my direct supervision. CONTRAST:  None COMPLICATIONS: SIR Level  A - No therapy, no consequence. Procedure complicated by solitary episode of self-limited hemoptysis not requiring dedicated intervention.  PROCEDURE: Informed consent was obtained from the patient following an explanation of the procedure, risks, benefits and alternatives. The patient understands,agrees and consents for the procedure. All questions were addressed. A time out was performed prior to the initiation of the procedure. The patient was positioned right lateral decubitus on the CT table and a limited chest CT was performed for procedural planning demonstrating unchanged size of the approximately 1.5 x 1.4 cm ground-glass nodular opacity within the right lung apex (image 17, series 2. The operative site was prepped and draped in the usual sterile fashion. Under sterile conditions and local anesthesia, a 17 gauge coaxial needle was advanced into the peripheral aspect of the nodule. Positioning was confirmed with intermittent CT fluoroscopy and followed by the acquisition of solitary core needle biopsy with an 18 gauge core needle biopsy device. Following the acquisition of the core needle biopsy, patient experienced a solitary episode of self-limited hemoptysis and as such, additional samples were not obtained. The coaxial needle was removed following deployment of a Biosentry plug and superficial hemostasis was achieved with manual compression. Patient was maintained right lateral decubitus until postprocedural radiograph was obtained. Limited post procedural chest CT was negative for pneumothorax or additional complication. A dressing was placed. The patient tolerated the procedure well without immediate postprocedural complication. The patient was escorted to have an upright chest radiograph. IMPRESSION: Technically successful CT guided core needle core biopsy of indeterminate right upper lobe pulmonary nodule complicated by development of solitary episode of self-limited hemoptysis. Electronically Signed   By: Sandi Mariscal M.D.   On: 11/25/2017 13:21   Dg Chest Port 1 View  Result Date: 11/25/2017 CLINICAL DATA:  Post right upper lobe  pulmonary nodule biopsy EXAM: PORTABLE CHEST 1 VIEW COMPARISON:  CT-guided right upper lobe pulmonary nodule biopsy-earlier same day; PET-CT-11/03/2017; chest CT-10/11/2017; chest radiograph-01/07/2017; 12/27/2016 FINDINGS: Grossly unchanged enlarged cardiac silhouette and mediastinal contours post median sternotomy and valve replacement. Atherosclerotic plaque within the thoracic aorta. Mild pulmonary venous congestion without frank evidence of edema, grossly unchanged. There is persistent pleuroparenchymal thickening about the right minor fissure. Known ill-defined right upper lobe pulmonary nodule is not well demonstrated on the present examination. No new focal airspace opacities. No pleural effusion or pneumothorax. No acute osseus abnormalities. IMPRESSION: 1. No evidence of delayed complication following right upper lobe pulmonary nodule biopsy. Specifically, no evidence of pneumothorax. 2. Similar findings of cardiomegaly and pulmonary venous congestion. Electronically Signed   By: Sandi Mariscal M.D.   On: 11/25/2017 13:20    ASSESSMENT: This is a very pleasant 72 years old African-American female recently diagnosed with a stage Ia (T1b, N0, M0) non-small cell lung cancer, well-differentiated adenocarcinoma with biopsy-proven right upper lobe pulmonary nodule diagnosed in May 2019.  The patient also has 2 other suspicious nodule in the right upper lobe and left lower lobe but they are too small to characterize at this point and they could represent multifocal disease.   PLAN: I had a lengthy discussion with the patient and her husband today about her current disease stage, prognosis and treatment options.  Unfortunately the patient has a lot of comorbidities and she is not a good surgical candidate for resection.  She was referred by her thoracic surgeon to the multidisciplinary thoracic oncology clinic for evaluation and discussion of other treatment options. I recommended for the patient to consider  stereotactic radiotherapy to the right upper  lobe pulmonary nodule.  We will continue to monitor the other smaller nodules in the right upper lobe and left lower lobe closely on upcoming scans and if they are increasing in size, we may consider future treatment with stereotactic radiotherapy to these nodules or consideration of molecular studies of her current biopsy looking for actionable mutations to treat the patient with targeted therapy in the future. The patient we will see Dr. Sondra Come from radiation oncology later today for evaluation and discussion of the radiotherapy option. I will arrange for the patient to come back for follow-up visit in 6 months for reevaluation and repeat CT scan of the chest for restaging of her disease. She was seen during the multidisciplinary thoracic oncology clinic today by medical oncology, radiation oncology, physical therapist and thoracic navigator. She was advised to call immediately if she has any concerning symptoms in the interval.  The patient voices understanding of current disease status and treatment options and is in agreement with the current care plan.  All questions were answered. The patient knows to call the clinic with any problems, questions or concerns. We can certainly see the patient much sooner if necessary.  Thank you so much for allowing me to participate in the care of Avis. I will continue to follow up the patient with you and assist in her care.  I spent 40 minutes counseling the patient face to face. The total time spent in the appointment was 60 minutes.  Disclaimer: This note was dictated with voice recognition software. Similar sounding words can inadvertently be transcribed and may not be corrected upon review.   Eilleen Kempf Dec 08, 2017, 2:20 PM

## 2017-12-08 NOTE — Progress Notes (Signed)
  Oncology Nurse Navigator Documentation  Navigator Location: [facility] (12/08/17 1400)   )Navigator Encounter Type: Clinic/MDC (12/08/17 1400)               Multidisiplinary Clinic Date: 12/08/17 (12/08/17 1400) Multidisiplinary Clinic Type: Thoracic (12/08/17 1400)     Treatment Phase: Pre-Tx/Tx Discussion (12/08/17 1400) Barriers/Navigation Needs: Coordination of Care(Tumor Board Discussion: Evaluation for surgery) (12/08/17 1400)                          Time Spent with Patient: > 120 (12/08/17 1400)

## 2017-12-09 ENCOUNTER — Encounter: Payer: Self-pay | Admitting: Internal Medicine

## 2017-12-09 DIAGNOSIS — Z7189 Other specified counseling: Secondary | ICD-10-CM | POA: Insufficient documentation

## 2017-12-14 ENCOUNTER — Ambulatory Visit
Admission: RE | Admit: 2017-12-14 | Discharge: 2017-12-14 | Disposition: A | Discharge status: Home / Self Care | Payer: Medicare Other | Source: Ambulatory Visit | Attending: Radiation Oncology | Admitting: Radiation Oncology

## 2017-12-14 DIAGNOSIS — C3411 Malignant neoplasm of upper lobe, right bronchus or lung: Secondary | ICD-10-CM | POA: Diagnosis not present

## 2017-12-14 DIAGNOSIS — Z510 Encounter for antineoplastic radiation therapy: Secondary | ICD-10-CM | POA: Diagnosis not present

## 2017-12-14 DIAGNOSIS — C3491 Malignant neoplasm of unspecified part of right bronchus or lung: Secondary | ICD-10-CM

## 2017-12-14 NOTE — Progress Notes (Signed)
  Radiation Oncology         [phone] ________________________________  Name: [name] MRN: [id number]  Date: 12/14/2017  DOB: [birth year]   STEREOTACTIC BODY RADIOTHERAPY SIMULATION AND TREATMENT PLANNING NOTE  DIAGNOSIS:   72 y.o. female with Clinical Stage I NSCLC (well-differentiated adenocarcinoma)  NARRATIVE:  The patient was brought to the [facility].  Identity was confirmed.  All relevant records and images related to the planned course of therapy were reviewed.  The patient freely provided informed written consent to proceed with treatment after reviewing the details related to the planned course of therapy. The consent form was witnessed and verified by the simulation staff.  Then, the patient was set-up in a stable reproducible  supine position for radiation therapy.  A BodyFix immobilization pillow was fabricated for reproducible positioning.  Then I personally applied the abdominal compression paddle to limit respiratory excursion.  4D respiratoy motion management CT images were obtained.  Surface markings were placed.  The CT images were loaded into the planning software.  Then, using Cine, MIP, and standard views, the internal target volume (ITV) and planning target volumes (PTV) were delinieated, and avoidance structures were contoured.  Treatment planning then occurred.  The radiation prescription was entered and confirmed.  A total of two complex treatment devices were fabricated in the form of the BodyFix immobilization pillow and a neck accuform cushion.  I have requested : 3D Simulation  I have requested a DVH of the following structures: Heart, Lungs, Esophagus, Chest Wall, Brachial Plexus, Major Blood Vessels, and targets.  PLAN:  The patient will receive 54 Gy in 3 fractions.  -----------------------------------  Blair Promise, PhD, MD

## 2017-12-15 ENCOUNTER — Other Ambulatory Visit: Payer: Self-pay | Admitting: *Deleted

## 2017-12-15 NOTE — Patient Outreach (Signed)
[facility]) Care Management  12/15/2017  [name] Jul 18, 1946 [id number]   RN Health Coach Monthly Outreach  Referral Date:01/12/2017 Referral Source:[facility] RN Case Manager Reason for Referral:CHF education Insurance:Medicare   Outreach Attempt:  Outreach attempt #2 to patient for monthly follow up. No answer. RN Health Coach left HIPAA compliant voicemail message along with contact information.  Plan:  RN Health Coach will make another outreach attempt to patient within 3-4 business days if no return call back from patient.  [place] [phone] [email or web address]

## 2017-12-16 ENCOUNTER — Encounter: Payer: Self-pay | Admitting: *Deleted

## 2017-12-16 NOTE — Progress Notes (Signed)
[place] Psychosocial Distress Screening Clinical Social Work  Clinical Social Work was referred by distress screening protocol.  The patient scored a 6 on the Psychosocial Distress Thermometer which indicates moderate distress. Clinical Social Worker contacted patient by phone to assess for distress and other psychosocial needs. [name] reported many stressors prior to cancer diagnosis including many medical issues and raising her three year old great grandson.  Patient and CSW discussed concept of "living day by day" as a means of coping with life changes.  [name] reported coping well and receives support from her spouse.  The patient reported feeling relieved she would only need three radiation treatments.  CSW encouraged patient to call with any questions or concerns.  ONCBCN DISTRESS SCREENING 12/08/2017  Screening Type Initial Screening  Distress experienced in past week (1-10) 6  Practical problem type Childcare  Emotional problem type Nervousness/Anxiety;Boredom  Information Concerns Type Lack of info about diagnosis;Lack of info about treatment;Lack of info about complementary therapy choices;Lack of info about maintaining fitness  Physical Problem type Bathing/dressing;Tingling hands/feet;Swollen arms/legs  Other notified SW     Kennith [place], LCSW

## 2017-12-19 DIAGNOSIS — C3411 Malignant neoplasm of upper lobe, right bronchus or lung: Secondary | ICD-10-CM | POA: Insufficient documentation

## 2017-12-19 DIAGNOSIS — Z510 Encounter for antineoplastic radiation therapy: Secondary | ICD-10-CM | POA: Diagnosis not present

## 2017-12-19 DIAGNOSIS — Z87891 Personal history of nicotine dependence: Secondary | ICD-10-CM | POA: Diagnosis not present

## 2017-12-20 ENCOUNTER — Encounter: Payer: Self-pay | Admitting: *Deleted

## 2017-12-20 ENCOUNTER — Other Ambulatory Visit: Payer: Self-pay | Admitting: *Deleted

## 2017-12-20 NOTE — Patient Outreach (Signed)
[facility]) Care Management  12/20/2017  [name] 1945-11-03 [id number]   RN Health Coach Monthly Outreach  Referral Date:01/12/2017 Referral Source:[facility] RN Case Manager Reason for Referral:CHF education Insurance:Medicare   Outreach Attempt:  Successful telephone outreach to patient for monthly follow up.  HIPAA verified with patient.  Patient stating she feels well and is trying to stay positive with the new diagnosis of cancer.  Reports her family is helping keep her spirits up.  Patient stating she will start Radiation on 12/22/2017 for 3 treatments and then go from there.  Reports her blood sugar this morning is 269 with fasting ranges of 200's in the past several weeks.  Denies any hypoglycemic episodes and reports her cold is resolved after round of antibiotics prescribed.  Weight today down to 158 pounds after increase in lasix does by nephrologist.  Denies any lower extremity edema at this time.  Appointments:   Patient last saw primary care provider on 11/30/2017 and had scheduled follow up on 01/02/2018.  Starting Radiation on 12/22/2017.  Plan:  RN Health Coach will make next monthly outreach to patient in the month of July.   [facility] [phone] [name]

## 2017-12-22 ENCOUNTER — Ambulatory Visit
Admission: RE | Admit: 2017-12-22 | Discharge: 2017-12-22 | Disposition: A | Discharge status: Home / Self Care | Payer: Medicare Other | Source: Ambulatory Visit | Attending: Radiation Oncology | Admitting: Radiation Oncology

## 2017-12-22 DIAGNOSIS — C3491 Malignant neoplasm of unspecified part of right bronchus or lung: Secondary | ICD-10-CM

## 2017-12-22 DIAGNOSIS — Z87891 Personal history of nicotine dependence: Secondary | ICD-10-CM | POA: Diagnosis not present

## 2017-12-22 DIAGNOSIS — Z510 Encounter for antineoplastic radiation therapy: Secondary | ICD-10-CM | POA: Diagnosis not present

## 2017-12-22 DIAGNOSIS — C3411 Malignant neoplasm of upper lobe, right bronchus or lung: Secondary | ICD-10-CM | POA: Diagnosis not present

## 2017-12-22 NOTE — Progress Notes (Signed)
  Radiation Oncology         [phone] ________________________________  Name: JANNE FAULK MRN: [id number]  Date: 12/22/2017  DOB: 09-May-1946  Stereotactic Body Radiotherapy Treatment Procedure Note  NARRATIVE:  Aleiyah A Cody was brought to the stereotactic radiation treatment machine and placed supine on the CT couch. The patient was set up for stereotactic body radiotherapy on the body fix pillow.  3D TREATMENT PLANNING AND DOSIMETRY:  The patient's radiation plan was reviewed and approved prior to starting treatment.  It showed 3-dimensional radiation distributions overlaid onto the planning CT.  The [facility] for the target structures as well as the organs at risk were reviewed. The documentation of this is filed in the radiation oncology EMR.  SIMULATION VERIFICATION:  The patient underwent CT imaging on the treatment unit.  These were carefully aligned to document that the ablative radiation dose would cover the target volume and maximally spare the nearby organs at risk according to the planned distribution.  SPECIAL TREATMENT PROCEDURE: Merideth A Heron received high dose ablative stereotactic body radiotherapy to the planned target volume without unforeseen complications. Treatment was delivered uneventfully. The high doses associated with stereotactic body radiotherapy and the significant potential risks require careful treatment set up and patient monitoring constituting a special treatment procedure   STEREOTACTIC TREATMENT MANAGEMENT:  Following delivery, the patient was evaluated clinically. The patient tolerated treatment without significant acute effects, and was discharged to home in stable condition.    PLAN: Continue treatment as planned.  ________________________________  Blair Promise, PhD, MD

## 2017-12-23 ENCOUNTER — Ambulatory Visit (INDEPENDENT_AMBULATORY_CARE_PROVIDER_SITE_OTHER): Payer: Medicare Other | Admitting: Pharmacist

## 2017-12-23 DIAGNOSIS — Z5181 Encounter for therapeutic drug level monitoring: Secondary | ICD-10-CM

## 2017-12-23 DIAGNOSIS — I4891 Unspecified atrial fibrillation: Secondary | ICD-10-CM

## 2017-12-23 LAB — POCT INR: INR: 3.3 — AB (ref 2.0–3.0)

## 2017-12-26 ENCOUNTER — Ambulatory Visit
Admission: RE | Admit: 2017-12-26 | Discharge: 2017-12-26 | Disposition: A | Discharge status: Home / Self Care | Payer: Medicare Other | Source: Ambulatory Visit | Attending: Radiation Oncology | Admitting: Radiation Oncology

## 2017-12-26 DIAGNOSIS — C3491 Malignant neoplasm of unspecified part of right bronchus or lung: Secondary | ICD-10-CM

## 2017-12-26 DIAGNOSIS — Z87891 Personal history of nicotine dependence: Secondary | ICD-10-CM | POA: Diagnosis not present

## 2017-12-26 DIAGNOSIS — Z510 Encounter for antineoplastic radiation therapy: Secondary | ICD-10-CM | POA: Diagnosis not present

## 2017-12-26 DIAGNOSIS — C3411 Malignant neoplasm of upper lobe, right bronchus or lung: Secondary | ICD-10-CM | POA: Diagnosis not present

## 2017-12-26 NOTE — Progress Notes (Signed)
  Radiation Oncology         [phone] ________________________________  Name: [name] MRN: [id number]  Date: 12/26/2017  DOB: 1945-12-03  Stereotactic Body Radiotherapy Treatment Procedure Note  NARRATIVE:  [name] was brought to the stereotactic radiation treatment machine and placed supine on the CT couch. The patient was set up for stereotactic body radiotherapy on the body fix pillow.  3D TREATMENT PLANNING AND DOSIMETRY:  The patient's radiation plan was reviewed and approved prior to starting treatment.  It showed 3-dimensional radiation distributions overlaid onto the planning CT.  The [facility] for the target structures as well as the organs at risk were reviewed. The documentation of this is filed in the radiation oncology EMR.  SIMULATION VERIFICATION:  The patient underwent CT imaging on the treatment unit.  These were carefully aligned to document that the ablative radiation dose would cover the target volume and maximally spare the nearby organs at risk according to the planned distribution.  SPECIAL TREATMENT PROCEDURE: [name] received high dose ablative stereotactic body radiotherapy to the planned target volume without unforeseen complications. Treatment was delivered uneventfully. The high doses associated with stereotactic body radiotherapy and the significant potential risks require careful treatment set up and patient monitoring constituting a special treatment procedure   STEREOTACTIC TREATMENT MANAGEMENT:  Following delivery, the patient was evaluated clinically. The patient tolerated treatment without significant acute effects, and was discharged to home in stable condition.    PLAN: Continue treatment as planned.  ________________________________  Blair Promise, PhD, MD  This document serves as a record of services personally performed by Blair Promise, PhD, MD. It was created on his behalf by Margit Banda, a trained medical scribe. The  creation of this record is based on the scribe's personal observations and the provider's statements to them. This document has been checked and approved by the attending provider.

## 2017-12-27 ENCOUNTER — Ambulatory Visit: Payer: Medicare Other | Admitting: Radiation Oncology

## 2017-12-29 ENCOUNTER — Encounter: Payer: Self-pay | Admitting: Radiation Oncology

## 2017-12-29 ENCOUNTER — Other Ambulatory Visit: Payer: Self-pay | Admitting: Internal Medicine

## 2017-12-29 ENCOUNTER — Ambulatory Visit
Admission: RE | Admit: 2017-12-29 | Discharge: 2017-12-29 | Disposition: A | Discharge status: Home / Self Care | Payer: Medicare Other | Source: Ambulatory Visit | Attending: Radiation Oncology | Admitting: Radiation Oncology

## 2017-12-29 DIAGNOSIS — Z510 Encounter for antineoplastic radiation therapy: Secondary | ICD-10-CM | POA: Diagnosis not present

## 2017-12-29 DIAGNOSIS — C3411 Malignant neoplasm of upper lobe, right bronchus or lung: Secondary | ICD-10-CM | POA: Diagnosis not present

## 2017-12-29 DIAGNOSIS — Z87891 Personal history of nicotine dependence: Secondary | ICD-10-CM | POA: Diagnosis not present

## 2017-12-29 DIAGNOSIS — C3491 Malignant neoplasm of unspecified part of right bronchus or lung: Secondary | ICD-10-CM

## 2017-12-29 NOTE — Progress Notes (Signed)
  Radiation Oncology         [phone] ________________________________  Name: [name] MRN: [id number]  Date: 12/29/2017  DOB: October 06, 1945  Stereotactic Body Radiotherapy Treatment Procedure Note  NARRATIVE:  [name] was brought to the stereotactic radiation treatment machine and placed supine on the CT couch. The patient was set up for stereotactic body radiotherapy on the body fix pillow.  3D TREATMENT PLANNING AND DOSIMETRY:  The patient's radiation plan was reviewed and approved prior to starting treatment.  It showed 3-dimensional radiation distributions overlaid onto the planning CT.  The [facility] for the target structures as well as the organs at risk were reviewed. The documentation of this is filed in the radiation oncology EMR.  SIMULATION VERIFICATION:  The patient underwent CT imaging on the treatment unit.  These were carefully aligned to document that the ablative radiation dose would cover the target volume and maximally spare the nearby organs at risk according to the planned distribution.  SPECIAL TREATMENT PROCEDURE: [name] received high dose ablative stereotactic body radiotherapy to the planned target volume without unforeseen complications. Treatment was delivered uneventfully. The high doses associated with stereotactic body radiotherapy and the significant potential risks require careful treatment set up and patient monitoring constituting a special treatment procedure   STEREOTACTIC TREATMENT MANAGEMENT:  Following delivery, the patient was evaluated clinically. The patient tolerated treatment without significant acute effects, and was discharged to home in stable condition.    PLAN: Follow up in 1 month.   ________________________________  Blair Promise, PhD, MD  This document serves as a record of services personally performed by Gery Pray, MD. It was created on his behalf by [facility], a trained medical scribe. The creation of this record  is based on the scribe's personal observations and the provider's statements to them. This document has been checked and approved by the attending provider.

## 2017-12-29 NOTE — Progress Notes (Signed)
  Radiation Oncology         [phone]) [phone] ________________________________  Name: [name] MRN: [id number]  Date: 12/29/2017  DOB: [birth year]  End of Treatment Note  Diagnosis:   72 y.o. female with Clinical Stage I NSCLC (well-differentiated adenocarcinoma) The encounter diagnosis was Adenocarcinoma of lung, stage 1, right ([place]).   Indication for treatment:  Curative       Radiation treatment dates:   12/22/2017, 12/26/2017, 12/29/2017  Site/dose:   Right lung, 18 Gy of 3 fractions for a total dose of 54 Gy  Beams/energy:   SBRT/SRT-VMAT, 6X-FFF  Narrative: The patient tolerated radiation treatment relatively well. Throughout her treatments, she noted moderate fatigue, productice cough x yellow sputum, mild hemoptysis, mild difficulty swallowing, lack of appetite, and DOE. She denied pain or skin issues.   Plan: The patient has completed radiation treatment. The patient will return to radiation oncology clinic for routine followup in one month. I advised them to call or return sooner if they have any questions or concerns related to their recovery or treatment.  -----------------------------------  Blair Promise, PhD, MD  This document serves as a record of services personally performed by Gery Pray, MD. It was created on his behalf by [facility], a trained medical scribe. The creation of this record is based on the scribe's personal observations and the provider's statements to them. This document has been checked and approved by the attending provider.

## 2018-01-02 ENCOUNTER — Ambulatory Visit: Payer: Medicare Other | Admitting: Internal Medicine

## 2018-01-12 ENCOUNTER — Encounter: Payer: Self-pay | Admitting: *Deleted

## 2018-01-20 ENCOUNTER — Ambulatory Visit (INDEPENDENT_AMBULATORY_CARE_PROVIDER_SITE_OTHER): Payer: Medicare Other | Admitting: Pharmacist

## 2018-01-20 DIAGNOSIS — I4891 Unspecified atrial fibrillation: Secondary | ICD-10-CM

## 2018-01-20 DIAGNOSIS — Z5181 Encounter for therapeutic drug level monitoring: Secondary | ICD-10-CM | POA: Diagnosis not present

## 2018-01-20 LAB — POCT INR: INR: 4.1 — AB (ref 2.0–3.0)

## 2018-01-24 ENCOUNTER — Encounter: Payer: Self-pay | Admitting: *Deleted

## 2018-01-24 ENCOUNTER — Telehealth: Payer: Self-pay | Admitting: Cardiology

## 2018-01-24 ENCOUNTER — Other Ambulatory Visit: Payer: Self-pay | Admitting: *Deleted

## 2018-01-24 DIAGNOSIS — E113291 Type 2 diabetes mellitus with mild nonproliferative diabetic retinopathy without macular edema, right eye: Secondary | ICD-10-CM | POA: Diagnosis not present

## 2018-01-24 DIAGNOSIS — H11153 Pinguecula, bilateral: Secondary | ICD-10-CM | POA: Diagnosis not present

## 2018-01-24 DIAGNOSIS — H04123 Dry eye syndrome of bilateral lacrimal glands: Secondary | ICD-10-CM | POA: Diagnosis not present

## 2018-01-24 DIAGNOSIS — H2513 Age-related nuclear cataract, bilateral: Secondary | ICD-10-CM | POA: Diagnosis not present

## 2018-01-24 DIAGNOSIS — H35033 Hypertensive retinopathy, bilateral: Secondary | ICD-10-CM | POA: Diagnosis not present

## 2018-01-24 DIAGNOSIS — E119 Type 2 diabetes mellitus without complications: Secondary | ICD-10-CM | POA: Diagnosis not present

## 2018-01-24 NOTE — Telephone Encounter (Signed)
LMTCB  Patient had fasting labs (lipid/liver) ordered in April

## 2018-01-24 NOTE — Telephone Encounter (Addendum)
New message    [facility] requesting patient be called by nurse to educate patient on fasting labs (lipid panel) Patient is unable to fast in the mornings.

## 2018-01-24 NOTE — Patient Outreach (Signed)
[name]) Care Management  [name]  01/24/2018   [name] [birth year] [id number]   RN Health Coach Monthly Outreach   Referral Date: 01/12/2017 Referral Source: [facility] RN Case Manager Reason for Referral: CHF education Insurance: Medicare   Outreach Attempt:  Successful telephone outreach to patient for monthly follow up.  HIPAA verified with patient.  Patient reporting she has completed her 3 doses of radiation for her lung cancer and she feels weak, tired and with some back pain.  Patient also reporting neuropathy pain in her feet.  Denies any falls.  Reports fasting blood sugars have ranged 200-240's.  Patient voicing concern and frustration with multiple medical appointments and lab draws.  Discussed with patient speak with her physicians to see if the can group lab draws together and have them drawn at one appointment for multiple providers.  Patient also concerned about having her fasting lipids drawn after being initiated on her statin.  Stating she can not fast for afternoon appointments for fear of her blood sugar dropping.  She is unable to attend morning appointments because she does not have a ride.  RN Health Coach contacted Cardiologist office to verify time limit patient needs to be fasting for lipids to be drawn.  Per [name] at Dr. Jacalyn Lefevre office, patient only needs to be fasting for 4-6 hours and lab can be drawn with her coumadin appointment coming up.  RN Health Coach requested nurse [name] with Dr. [name] discuss this time period with patient.  Information also passed on to patient.  Patient stating she will try to fast 4-5 hours prior to her upcoming coumadin clinic appointment.  Encouraged patient to take a snack with her to eat after lab work is completed.  Patient also stating she has not been taking her lasix dose due to her not being able to get a physician to refill the prescription.  Denies any shortness of breath and states she has  not swelling in her lower extremities.  Reports weight has been ranging from 160-164 pounds.  Encouraged patient to discuss lasix with Cardiology Nurse giving her a call and her Nephrologist.  Appointments:  Patient cancelled her primary care appointment for June.  Encouraged patient to reschedule appointment as soon as possible.  Patient stating she has a nephrologist appointment, tomorrow, July 10.  Follow up with Oncologist on 02/09/2018.  Encounter Medications:  Outpatient Encounter Medications as of 01/24/2018  Medication Sig Note  . amLODipine (NORVASC) 10 MG tablet Take 5 mg by mouth daily. TAKE 1/2 Tablet by mouth daily    . dapsone 25 MG tablet Take 25 mg by mouth daily.    Marland Kitchen gabapentin (NEURONTIN) 100 MG capsule Take 100 mg by mouth 3 (three) times daily.   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 glucose blood (ONETOUCH VERIO) test strip 1 each by Other route 3 (three) times daily as needed for other. Dx E11.9   . HUMALOG KWIKPEN 100 UNIT/ML KiwkPen IF SUGAR:140-200 USE 8 UNITS,201-230 10 UNITS,231-260 12 UNITS,261-300 14 UNITS,>301 16 UNITS   . Insulin Pen Needle (B-D UF III MINI PEN NEEDLES) 31G X 5 MM MISC USE TO ADMINISTER INSULIN FOUR TIMES A DAY DX E11.9   . KLOR-CON M20 20 MEQ tablet TAKE 2 TABLETS (40 MEQ TOTAL) BY MOUTH DAILY. (Patient taking differently: Take 20 mEq by mouth daily. ) 10/05/2017: Reports not taking  .  levothyroxine (SYNTHROID, LEVOTHROID) 25 MCG tablet TAKE 2 TABLETS (50 MCG TOTAL) BY MOUTH DAILY.   Marland Kitchen lisinopril (PRINIVIL,ZESTRIL) 5 MG tablet Take 5 mg by mouth at bedtime.    . metoprolol tartrate (LOPRESSOR) 25 MG tablet TAKE 1 TABLET BY MOUTH TWICE A DAY   . Multiple Vitamins-Minerals (CENTRUM SILVER 50+WOMEN) TABS Take 1 tablet by mouth daily with breakfast.   . mycophenolate (MYFORTIC) 180 MG EC tablet Take 360 mg by mouth 2 (two) times daily.    [name] DELICA LANCETS 93T  MISC Use to check blood sugars three times a day DX E11.9   . oxyCODONE-acetaminophen (ROXICET) 5-325 MG tablet Take 0.5-1 tablets by mouth every 6 (six) hours as needed for severe pain.   . rosuvastatin (CRESTOR) 20 MG tablet Take 1 tablet (20 mg total) by mouth daily.   . tacrolimus (PROGRAF) 1 MG capsule Take 3 mg by mouth 2 (two) times daily. Take 3 tablets by mouth twice daily    . warfarin (COUMADIN) 5 MG tablet Take 5-7.5 mg by mouth See admin instructions. 7.5 mg on Monday and Friday, 5 mg all other days   . amoxicillin (AMOXIL) 500 MG capsule TAKE 4 CAPSULES BY MOUTH 1 HOUR PRIOR TO DENTAL APPOINTMENT (Patient not taking: Reported on 12/08/2017)   . diphenhydrAMINE (BENADRYL) 50 MG capsule Take 1 capsule (50 mg total) by mouth every 6 (six) hours as needed for itching.   . furosemide (LASIX) 20 MG tablet Take 60 mg by mouth 2 (two) times daily.  01/24/2018: Reports not taking, could not get anyone to refill prescription   No facility-administered encounter medications on file as of 01/24/2018.     Functional Status:  In your present state of health, do you have any difficulty performing the following activities: 10/06/2017 05/25/2017  Hearing? N N  Vision? N N  Difficulty concentrating or making decisions? N N  Walking or climbing stairs? N Y  Dressing or bathing? N N  Doing errands, shopping? N N  Preparing Food and eating ? N N  Using the Toilet? N N  In the past six months, have you accidently leaked urine? N Y  Do you have problems with loss of bowel control? N N  Managing your Medications? N N  Managing your Finances? N N  Housekeeping or managing your Housekeeping? N N  Some recent data might be hidden    Fall/Depression Screening: Fall Risk  01/24/2018 11/08/2017 10/06/2017  Falls in the past year? Yes Yes No  Comment denies any falls in the past 3 months denies any recent falls in the past few months -  Number falls in past yr: 1 1 -  Comment - - -  Injury with Fall? No No -   Risk for fall due to : History of fall(s);Impaired balance/gait;Impaired mobility;Medication side effect History of fall(s);Impaired balance/gait;Impaired mobility -  Follow up Falls prevention discussed;Falls evaluation completed Falls prevention discussed;Education provided -   [facility] 2/9 Scores 10/06/2017 05/05/2017 01/21/2017 01/12/2017 11/21/2015 11/11/2014 06/04/2013  PHQ - 2 Score 0 0 0 0 0 0 0    [facility] CM Care Plan Problem One     Most Recent Value  Care Plan Problem One  Knowledge deficiet related to self care management of diabetes  Role Documenting the Problem One  [place] for Problem One  Active  [facility] Long Term Goal   Patient will report a decrease in her Hgb A1C by 0.5 points in the next 90 days  [facility] Long Term Goal Start Date  01/24/18  Interventions for Problem One Long Term Goal  Current care plan and goals discussed and reviewed with patient, encouraged patient to monitor fasting blood sugars in the morning, encouraged patient to aim for fasting ranges of less than 200's, reviewed medications and encouraged medication compliance, encouraged patient to keep and attend scheduled medical appointments and to reschedule missed or cancelled appointments as soon as possible  [facility] CM Short Term Goal #1   Patient will report rescheduling cancelled primary care appointment in the next 30 days.  [facility] CM Short Term Goal #1 Start Date  01/24/18  [facility] CM Short Term Goal #1 Met Date  01/24/18  Interventions for Short Term Goal #1  Discussed importance of medical follow up with primary care provider, encouraged patient to reschedule cancelled appointment this month with primary care provider, encouraged patient to discuss multiple medical appointments and labatory grouping with her physicians to prevent multiple appointments and lab draws, contacted Cardiologist office to see when fasting lipids can be drawn and discussed with patient, encouraged patient to discuss not taking lasix with  nephrologist and cardiology nurse  [facility] CM Short Term Goal #2   Patient will report fasting blood sugars less than 200's in the next 30 days.  [facility] CM Short Term Goal #2 Start Date  01/24/18  [facility] CM Short Term Goal #2 Met Date  01/24/18  Interventions for Short Term Goal #2  Discussed elevated fasting blood sugar ranges, encouraged and discussed healthier meal and drink options, reviewed signs and symptoms of hypo and hyperglycemia and discussed ways to prevent hypoglycemia, encouraged patient to eat atleast 3 meals a day,      Plan: [facility] will send primary care provider Quarterly Update. RN Health Coach will make next monthly outreach to patient in the month of August.  Onur Mori RN [place] [phone] Bobbie [email or web address] .Patsi Sears

## 2018-01-25 DIAGNOSIS — Z940 Kidney transplant status: Secondary | ICD-10-CM | POA: Diagnosis not present

## 2018-01-25 DIAGNOSIS — E119 Type 2 diabetes mellitus without complications: Secondary | ICD-10-CM | POA: Diagnosis not present

## 2018-01-25 DIAGNOSIS — N2581 Secondary hyperparathyroidism of renal origin: Secondary | ICD-10-CM | POA: Diagnosis not present

## 2018-01-25 NOTE — Telephone Encounter (Signed)
Called patient to make sure no questions with fasting labs. Patient stated she had spoke with someone and they answered her questions at this time.

## 2018-01-26 DIAGNOSIS — I10 Essential (primary) hypertension: Secondary | ICD-10-CM | POA: Diagnosis not present

## 2018-01-26 DIAGNOSIS — Z940 Kidney transplant status: Secondary | ICD-10-CM | POA: Diagnosis not present

## 2018-01-26 DIAGNOSIS — R600 Localized edema: Secondary | ICD-10-CM | POA: Diagnosis not present

## 2018-01-26 DIAGNOSIS — E119 Type 2 diabetes mellitus without complications: Secondary | ICD-10-CM | POA: Diagnosis not present

## 2018-01-26 DIAGNOSIS — D899 Disorder involving the immune mechanism, unspecified: Secondary | ICD-10-CM | POA: Diagnosis not present

## 2018-01-26 DIAGNOSIS — R809 Proteinuria, unspecified: Secondary | ICD-10-CM | POA: Diagnosis not present

## 2018-02-02 ENCOUNTER — Ambulatory Visit: Payer: Self-pay | Admitting: Radiation Oncology

## 2018-02-03 ENCOUNTER — Inpatient Hospital Stay (HOSPITAL_COMMUNITY)
Admission: EM | Admit: 2018-02-03 | Discharge: 2018-02-08 | DRG: 291 | Disposition: A | Discharge status: Home Health | Payer: Medicare Other | Attending: Internal Medicine | Admitting: Internal Medicine

## 2018-02-03 ENCOUNTER — Emergency Department (HOSPITAL_COMMUNITY): Payer: Medicare Other

## 2018-02-03 ENCOUNTER — Other Ambulatory Visit: Payer: Self-pay

## 2018-02-03 ENCOUNTER — Encounter (HOSPITAL_COMMUNITY): Payer: Self-pay | Admitting: Emergency Medicine

## 2018-02-03 DIAGNOSIS — I110 Hypertensive heart disease with heart failure: Secondary | ICD-10-CM | POA: Diagnosis not present

## 2018-02-03 DIAGNOSIS — Z905 Acquired absence of kidney: Secondary | ICD-10-CM

## 2018-02-03 DIAGNOSIS — C3491 Malignant neoplasm of unspecified part of right bronchus or lung: Secondary | ICD-10-CM | POA: Diagnosis present

## 2018-02-03 DIAGNOSIS — N183 Chronic kidney disease, stage 3 unspecified: Secondary | ICD-10-CM | POA: Diagnosis present

## 2018-02-03 DIAGNOSIS — E785 Hyperlipidemia, unspecified: Secondary | ICD-10-CM | POA: Diagnosis present

## 2018-02-03 DIAGNOSIS — Z886 Allergy status to analgesic agent status: Secondary | ICD-10-CM

## 2018-02-03 DIAGNOSIS — E1122 Type 2 diabetes mellitus with diabetic chronic kidney disease: Secondary | ICD-10-CM | POA: Diagnosis present

## 2018-02-03 DIAGNOSIS — I69920 Aphasia following unspecified cerebrovascular disease: Secondary | ICD-10-CM

## 2018-02-03 DIAGNOSIS — Z823 Family history of stroke: Secondary | ICD-10-CM

## 2018-02-03 DIAGNOSIS — Z881 Allergy status to other antibiotic agents status: Secondary | ICD-10-CM

## 2018-02-03 DIAGNOSIS — E873 Alkalosis: Secondary | ICD-10-CM | POA: Diagnosis present

## 2018-02-03 DIAGNOSIS — J9601 Acute respiratory failure with hypoxia: Secondary | ICD-10-CM | POA: Diagnosis present

## 2018-02-03 DIAGNOSIS — I132 Hypertensive heart and chronic kidney disease with heart failure and with stage 5 chronic kidney disease, or end stage renal disease: Principal | ICD-10-CM | POA: Diagnosis present

## 2018-02-03 DIAGNOSIS — J9611 Chronic respiratory failure with hypoxia: Secondary | ICD-10-CM | POA: Diagnosis present

## 2018-02-03 DIAGNOSIS — Z794 Long term (current) use of insulin: Secondary | ICD-10-CM

## 2018-02-03 DIAGNOSIS — G4730 Sleep apnea, unspecified: Secondary | ICD-10-CM | POA: Diagnosis present

## 2018-02-03 DIAGNOSIS — Z923 Personal history of irradiation: Secondary | ICD-10-CM

## 2018-02-03 DIAGNOSIS — I5033 Acute on chronic diastolic (congestive) heart failure: Secondary | ICD-10-CM | POA: Diagnosis not present

## 2018-02-03 DIAGNOSIS — Z888 Allergy status to other drugs, medicaments and biological substances status: Secondary | ICD-10-CM

## 2018-02-03 DIAGNOSIS — R509 Fever, unspecified: Secondary | ICD-10-CM

## 2018-02-03 DIAGNOSIS — R0602 Shortness of breath: Secondary | ICD-10-CM | POA: Diagnosis not present

## 2018-02-03 DIAGNOSIS — I481 Persistent atrial fibrillation: Secondary | ICD-10-CM | POA: Diagnosis present

## 2018-02-03 DIAGNOSIS — I151 Hypertension secondary to other renal disorders: Secondary | ICD-10-CM

## 2018-02-03 DIAGNOSIS — Z833 Family history of diabetes mellitus: Secondary | ICD-10-CM

## 2018-02-03 DIAGNOSIS — Z940 Kidney transplant status: Secondary | ICD-10-CM

## 2018-02-03 DIAGNOSIS — I4891 Unspecified atrial fibrillation: Secondary | ICD-10-CM

## 2018-02-03 DIAGNOSIS — G9340 Encephalopathy, unspecified: Secondary | ICD-10-CM | POA: Diagnosis not present

## 2018-02-03 DIAGNOSIS — E1129 Type 2 diabetes mellitus with other diabetic kidney complication: Secondary | ICD-10-CM | POA: Diagnosis present

## 2018-02-03 DIAGNOSIS — N644 Mastodynia: Secondary | ICD-10-CM | POA: Diagnosis present

## 2018-02-03 DIAGNOSIS — Z91048 Other nonmedicinal substance allergy status: Secondary | ICD-10-CM

## 2018-02-03 DIAGNOSIS — E876 Hypokalemia: Secondary | ICD-10-CM | POA: Diagnosis present

## 2018-02-03 DIAGNOSIS — H9190 Unspecified hearing loss, unspecified ear: Secondary | ICD-10-CM | POA: Diagnosis present

## 2018-02-03 DIAGNOSIS — I480 Paroxysmal atrial fibrillation: Secondary | ICD-10-CM | POA: Diagnosis present

## 2018-02-03 DIAGNOSIS — Z955 Presence of coronary angioplasty implant and graft: Secondary | ICD-10-CM

## 2018-02-03 DIAGNOSIS — Z885 Allergy status to narcotic agent status: Secondary | ICD-10-CM

## 2018-02-03 DIAGNOSIS — I509 Heart failure, unspecified: Secondary | ICD-10-CM

## 2018-02-03 DIAGNOSIS — I252 Old myocardial infarction: Secondary | ICD-10-CM

## 2018-02-03 DIAGNOSIS — Z953 Presence of xenogenic heart valve: Secondary | ICD-10-CM

## 2018-02-03 DIAGNOSIS — R0789 Other chest pain: Secondary | ICD-10-CM | POA: Diagnosis not present

## 2018-02-03 DIAGNOSIS — I2721 Secondary pulmonary arterial hypertension: Secondary | ICD-10-CM | POA: Diagnosis present

## 2018-02-03 DIAGNOSIS — N186 End stage renal disease: Secondary | ICD-10-CM | POA: Diagnosis not present

## 2018-02-03 DIAGNOSIS — I350 Nonrheumatic aortic (valve) stenosis: Secondary | ICD-10-CM | POA: Diagnosis present

## 2018-02-03 DIAGNOSIS — Z7901 Long term (current) use of anticoagulants: Secondary | ICD-10-CM

## 2018-02-03 DIAGNOSIS — Z8674 Personal history of sudden cardiac arrest: Secondary | ICD-10-CM

## 2018-02-03 DIAGNOSIS — Z79899 Other long term (current) drug therapy: Secondary | ICD-10-CM

## 2018-02-03 DIAGNOSIS — I2510 Atherosclerotic heart disease of native coronary artery without angina pectoris: Secondary | ICD-10-CM | POA: Diagnosis present

## 2018-02-03 DIAGNOSIS — Z882 Allergy status to sulfonamides status: Secondary | ICD-10-CM

## 2018-02-03 DIAGNOSIS — Z79890 Hormone replacement therapy (postmenopausal): Secondary | ICD-10-CM

## 2018-02-03 DIAGNOSIS — I160 Hypertensive urgency: Secondary | ICD-10-CM | POA: Diagnosis present

## 2018-02-03 NOTE — ED Triage Notes (Signed)
Pt reports ongoing shortness of breath and chest pain, per pt it's an ongoing issue. Reports constipation as well. Room air sats 82%, pt pale and unable to speak in full sentences.

## 2018-02-03 NOTE — ED Provider Notes (Signed)
[facility] Provider Note   CSN: [id number] Arrival date & time: 02/03/18  2338     History   Chief Complaint Chief Complaint  Patient presents with  . Shortness of Breath  . Chest Pain    HPI Rheba Diamond [name] is a 72 y.o. female.  Patient with a complicated medical history including HTN, HLD, ESRD s/p transplant 2012, CAD, MI, DM, CHF, presents with significant SOB that started 3 days ago, becoming progressively worse. She states it got to where when she would walk any distance, or even getting dressed, would cause severe SOB stating that she started having visual hallucinations with the worst symptoms. She is not on home oxygen. She reports being on TID Lasix until one month ago when she was told to take it as needed. She has taken one every morning for the last 3 days without improvement. She denies LE swelling. No chest pain, nausea/vomiting, fever. She has a chronic cough that is no worse today.   The history is provided by the patient. No language interpreter was used.  Shortness of Breath  Associated symptoms include cough (chronic). Pertinent negatives include no fever, no wheezing, no chest pain, no vomiting, no abdominal pain and no leg swelling.  Chest Pain   Associated symptoms include cough (chronic), shortness of breath and weakness. Pertinent negatives include no abdominal pain, no diaphoresis, no fever, no nausea, no palpitations and no vomiting.    Past Medical History:  Diagnosis Date  . Adenocarcinoma of lung, stage 1, right ([place]) 11/25/2017  . Anemia, iron deficiency    of chronic disease  . Aortic stenosis    a. Severe AS by echo 11/2012.  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 Asystole ([place])    a. During ENT surgery 2005: developed marked asystole requiring CPR, felt due to vagal reaction (cath nonobst dz).  . Carotid artery disease ([place])    a. Carotid Dopplers performed in August 2013 showed 40-59%  left stenosis and 0-39% right; f/u recommended in 2 years.   . Cerebrovascular accident [facility]) 2009   a. LMCA infarct felt embolic 1308, maintained on chronic coumadin.; denies residual on 04/05/2013  . CHF (congestive heart failure) ([place])   . Cholelithiasis   . Chronic cough onset 03/2010   Dr Melvyn Novas  . Chronic Persistent Atrial Fibrillation 12/31/2008   Qualifier: Diagnosis of  By: [name]    . Coronary artery disease 05/2002   a. Ant MI 2003 s/p PTCA/stent to RCA.   . Diverticulosis of colon   . Esophagitis, reflux   . ESRD (end stage renal disease) ([place])    a. Mass on L kidney per pt s/p nephrectomy - pt states not cancer - [facility] notes indicate ESRD due to HTN/DM - was previously on HD. b. Kidney transplant 02/2011.  Marland Kitchen GERD (gastroesophageal reflux disease)   . Gout   . Helicobacter pylori (H. pylori) infection    hx of  . Hemorrhoids   . Hx of colonic polyps    adenomatous  . Hyperlipidemia   . Hypertension   . Lung nodule seen on imaging study 04/07/2013   1.0 cm ground glass opacity RUL  . Myocardial infarction ([place]) 2003  . Pericardial effusion    a. Small by echo 11/2011.  Marland Kitchen Pruritic dermatitis    treated with steriods/UV light  . S/P aortic valve replacement with bioprosthetic valve and maze procedure 04/12/2013   79mm [employer] Ease bovine  pericardial tissue valve   . S/P Maze operation for atrial fibrillation 04/12/2013   Complete bilateral atrial lesion set using bipolar radiofrequency and cryothermy ablation with clipping of LA appendage  . Sleep apnea    Pt says testing was positive, intolerant of CPAP.  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  . Type II diabetes mellitus ([place])   . Unspecified hearing loss     Patient Active Problem List   Diagnosis Date Noted  . Goals of care,  counseling/discussion 12/09/2017  . Lung cancer ([place]) 11/30/2017  . Acute bronchitis 11/30/2017  . Adenocarcinoma of lung, stage 1, right ([place]) 11/25/2017  . Diabetes mellitus due to underlying condition with hyperglycemia, without long-term current use of insulin ([place])   . Acute respiratory failure with hypoxia ([place]) 12/27/2016  . Acute on chronic diastolic CHF (congestive heart failure) ([place]) 12/27/2016  . Left arm swelling 12/27/2016  . CKD (chronic kidney disease), stage III ([place]) 12/27/2016  . Hypertensive urgency 12/27/2016  . Pain of left breast 12/27/2016  . CHF exacerbation ([place]) 12/27/2016  . Arm muscle atrophy 11/16/2016  . Insomnia 02/25/2016  . Renal cyst 11/21/2015  . Renal mass, right 11/10/2015  . Skin abscess 08/13/2015  . Shoulder pain, right 08/13/2015  . Earache 11/11/2014  . Weight gain 08/12/2014  . Multinodular goiter 05/01/2014  . Foot pain, bilateral 04/12/2014  . Encounter for therapeutic drug monitoring 08/14/2013  . S/P AVR (aortic valve replacement) 05/14/2013  . S/P aortic valve replacement with bioprosthetic valve and maze procedure 04/12/2013  . S/P Maze operation for atrial fibrillation 04/12/2013  . Nodule of right lung 04/07/2013  . Lung nodule seen on imaging study 04/07/2013  . Pain in limb 05/22/2012  . Cramps, muscle, general 02/29/2012  . Dermatitis 01/26/2012  . Knee pain 01/26/2012  . Wrist pain 01/26/2012  . Long term (current) use of anticoagulants 08/17/2011  . S/P kidney transplant 03/16/2011  . Knee pain, right 02/23/2011  . Low back pain 02/23/2011  . Hypersalivation 11/24/2010  . AORTIC STENOSIS 08/20/2010  . DISTURBANCE OF SALIVARY SECRETION 07/28/2010  . COAGULOPATHY 05/27/2010  . NAUSEA 04/14/2010  . DYSPHAGIA, PHARYNGOESOPHAGEAL PHASE 03/10/2010  . End stage renal disease ([place]) 12/09/2009  . SMOKER 10/07/2009  . ALOPECIA 10/07/2009  . SKIN RASH 10/07/2009  . CAROTID STENOSIS 03/04/2009  . PRURITUS 03/04/2009  .  STREP INF CCE & UNS SITE GROUP D [ENTEROCOCCUS] 12/31/2008  . ANEMIA 12/31/2008  . Chronic Persistent Atrial Fibrillation 12/31/2008  . ECCHYMOSES 10/29/2008  . Cough 08/27/2008  . ABDOMINAL PAIN 08/27/2008  . NEOPLASM, MALIGNANT, KIDNEY 07/02/2008  . APHASIA DUE TO CEREBROVASCULAR DISEASE 07/02/2008  . Chronic fatigue 05/21/2008  . HYPERLIPIDEMIA 09/27/2007  . PERICARDIAL EFFUSION 09/27/2007  . CEREBROVASCULAR ACCIDENT 09/27/2007  . CONSTIPATION 09/27/2007  . SLEEP APNEA 09/27/2007  . CHOLELITHIASIS 06/09/2007  . ESOPHAGITIS, REFLUX 06/08/2007  . DUODENITIS, WITHOUT HEMORRHAGE 06/08/2007  . HELICOBACTER PYLORI INFECTION, HX OF 06/08/2007  . DM type 2 causing renal disease ([place]) 05/23/2007  . Gout 05/23/2007  . 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 MYOCARDIAL INFARCTION, HX OF 05/23/2007  . Coronary atherosclerosis 05/23/2007  . Congestive heart failure ([place]) 05/23/2007  . GERD 05/23/2007  . COLONIC POLYPS, HX OF 05/23/2007  . DIVERTICULOSIS, COLON 07/01/2005    Past Surgical History:  Procedure Laterality Date  . AORTIC VALVE REPLACEMENT N/A 04/12/2013  Procedure: AORTIC VALVE REPLACEMENT (AVR);  Surgeon: Rexene Alberts, MD;  Location: [place] [facility];  Service: Open Heart Surgery;  Laterality: N/A;  . ARTERIOVENOUS GRAFT PLACEMENT Left   . ARTERIOVENOUS GRAFT PLACEMENT Left    "I've had 2 on my left; had one removed" (04/05/2013)   . ARTERY EXPLORATION Right 04/11/2013   Procedure: ARTERY EXPLORATION;  Surgeon: Rexene Alberts, MD;  Location: [place];  Service: Open Heart Surgery;  Laterality: Right;  Right carotid artery exploration  . AV FISTULA PLACEMENT Right   . AV FISTULA REPAIR Right    "took it out" ((/18/2014)  . CARDIOVERSION  05/29/2012   Procedure: CARDIOVERSION;  Surgeon: Lelon Perla, MD;  Location[facility] ENDOSCOPY;  Service: Cardiovascular;  Laterality: N/A;  . CHOLECYSTECTOMY  2009   with hernia removal  . CORONARY ANGIOPLASTY WITH STENT PLACEMENT  Right    coronary artery  . INSERTION OF DIALYSIS CATHETER Bilateral    "over the years; took them both out" (04/05/2013)  . INTRAOPERATIVE TRANSESOPHAGEAL ECHOCARDIOGRAM N/A 04/11/2013   Procedure: INTRAOPERATIVE TRANSESOPHAGEAL ECHOCARDIOGRAM;  Surgeon: Rexene Alberts, MD;  Location: [place];  Service: Open Heart Surgery;  Laterality: N/A;  . INTRAOPERATIVE TRANSESOPHAGEAL ECHOCARDIOGRAM N/A 04/12/2013   Procedure: INTRAOPERATIVE TRANSESOPHAGEAL ECHOCARDIOGRAM;  Surgeon: Rexene Alberts, MD;  Location: [place];  Service: Open Heart Surgery;  Laterality: N/A;  . KIDNEY TRANSPLANT  03/16/11  . LEFT AND RIGHT HEART CATHETERIZATION WITH CORONARY ANGIOGRAM N/A 04/06/2013   Procedure: LEFT AND RIGHT HEART CATHETERIZATION WITH CORONARY ANGIOGRAM;  Surgeon: Blane Ohara, MD;  Location: [facility] CATH LAB;  Service: Cardiovascular;  Laterality: N/A;  . MAZE N/A 04/12/2013   Procedure: MAZE;  Surgeon: Rexene Alberts, MD;  Location: [place];  Service: Open Heart Surgery;  Laterality: N/A;  . NASAL RECONSTRUCTION WITH SEPTAL REPAIR     "took it out" (04/05/2013)  . NEPHRECTOMY Left 2010   no CA on bx  . TONSILLECTOMY    . TOTAL ABDOMINAL HYSTERECTOMY    . TUBAL LIGATION       OB History   None      Home Medications    Prior to Admission medications   Medication Sig Start Date End Date Taking? Authorizing Provider  amLODipine (NORVASC) 10 MG tablet Take 5 mg by mouth daily. TAKE 1/2 Tablet by mouth daily     [provider]  amoxicillin (AMOXIL) 500 MG capsule TAKE 4 CAPSULES BY MOUTH 1 HOUR PRIOR TO DENTAL APPOINTMENT Patient not taking: Reported on 12/08/2017 10/14/17   Plotnikov, [name], MD  dapsone 25 MG tablet Take 25 mg by mouth daily.     [provider]  diphenhydrAMINE (BENADRYL) 50 MG capsule Take 1 capsule (50 mg total) by mouth every 6 (six) hours as needed for itching. 08/31/11   Plotnikov, [name], MD  furosemide (LASIX) 20 MG tablet Take 60 mg by mouth 2 (two) times  daily.  04/11/17   [provider]  gabapentin (NEURONTIN) 100 MG capsule Take 100 mg by mouth 3 (three) times daily.    [provider]  glucose blood (ONETOUCH VERIO) test strip USE AS INSTRUCTED 4 TIMES A DAY (BEFORE MEALS) & AT BEDTIME (ICD 10-E11.9) 06/23/17   Plotnikov, [name], MD  glucose blood (ONETOUCH VERIO) test strip 1 each by Other route 3 (three) times daily as needed for other. Dx E11.9 10/12/17   Plotnikov, [name], MD  HUMALOG KWIKPEN 100 UNIT/ML KiwkPen IF SUGAR:140-200 USE 8 UNITS,201-230 10 UNITS,231-260  12 UNITS,261-300 14 UNITS,>301 16 UNITS 08/04/17   Plotnikov, [name], MD  Insulin Pen Needle (B-D UF III MINI PEN NEEDLES) 31G X 5 MM MISC USE TO ADMINISTER INSULIN FOUR TIMES A DAY DX E11.9 11/11/17   Plotnikov, [name], MD  KLOR-CON M20 20 MEQ tablet TAKE 2 TABLETS (40 MEQ TOTAL) BY MOUTH DAILY. Patient taking differently: Take 20 mEq by mouth daily.  02/24/17   Plotnikov, [name], MD  levothyroxine (SYNTHROID, LEVOTHROID) 25 MCG tablet TAKE 2 TABLETS (50 MCG TOTAL) BY MOUTH DAILY. 12/29/17   Plotnikov, [name], MD  lisinopril (PRINIVIL,ZESTRIL) 5 MG tablet Take 5 mg by mouth at bedtime.     [provider]  metoprolol tartrate (LOPRESSOR) 25 MG tablet TAKE 1 TABLET BY MOUTH TWICE A DAY 09/14/17   Sherran Needs, NP  Multiple Vitamins-Minerals (CENTRUM SILVER 50+WOMEN) TABS Take 1 tablet by mouth daily with breakfast.    [provider]  mycophenolate (MYFORTIC) 180 MG EC tablet Take 360 mg by mouth 2 (two) times daily.     [provider]  Jonetta Speak LANCETS 13Y MISC Use to check blood sugars three times a day DX E11.9 10/12/17   Plotnikov, [name], MD  oxyCODONE-acetaminophen (ROXICET) 5-325 MG tablet Take 0.5-1 tablets by mouth every 6 (six) hours as needed for severe pain. 09/02/17   Plotnikov, [name], MD  rosuvastatin (CRESTOR) 20 MG tablet Take 1 tablet (20 mg total) by mouth daily. 11/14/17   Lelon Perla, MD    tacrolimus (PROGRAF) 1 MG capsule Take 3 mg by mouth 2 (two) times daily. Take 3 tablets by mouth twice daily     [provider]  warfarin (COUMADIN) 5 MG tablet Take 5-7.5 mg by mouth See admin instructions. 7.5 mg on Monday and Friday, 5 mg all other days    [provider]    Family History Family History  Problem Relation Age of Onset  . Stroke Father   . Hypertension Mother   . Diabetes Other   . Diabetes Maternal Grandmother   . Diabetes Son   . Breast cancer Neg Hx     Social History Social History   Tobacco Use  . Smoking status: Former Smoker    Packs/day: 1.00    Years: 30.00    Pack years: 30.00    Types: Cigarettes    Last attempt to quit: 07/19/2001    Years since quitting: 16.5  . Smokeless tobacco: Never Used  Substance Use Topics  . Alcohol use: No  . Drug use: No     Allergies   Ibuprofen; Sulfamethoxazole-trimethoprim; Sulfonamide derivatives; Tape; Tramadol; Doxycycline; Hydrocil [psyllium]; Bactrim; and Red dye   Review of Systems Review of Systems  Constitutional: Positive for activity change and fatigue. Negative for chills, diaphoresis and fever.  HENT: Negative.  Negative for congestion.   Respiratory: Positive for cough (chronic), chest tightness (with inspiration) and shortness of breath. Negative for wheezing.   Cardiovascular: Negative.  Negative for chest pain, palpitations and leg swelling.  Gastrointestinal: Negative.  Negative for abdominal pain, nausea and vomiting.  Musculoskeletal: Negative.  Negative for myalgias.  Skin: Negative.  Negative for pallor.  Neurological: Positive for weakness and light-headedness. Negative for syncope.  Psychiatric/Behavioral: Positive for hallucinations.     Physical Exam Updated Vital Signs BP (!) 170/112 (BP Location: Right Arm)   Pulse 75   Temp 99.3 F (37.4 C) (Oral)   Resp (!) 25   Ht 5' (1.524 m)  Wt 74.4 kg (164 lb)   SpO2 97%   BMI 32.03 kg/m   Physical Exam   Constitutional: She is oriented to person, place, and time. She appears well-developed and well-nourished. She appears distressed.  HENT:  Head: Normocephalic.  Neck: Normal range of motion. Neck supple.  Cardiovascular: Normal rate and regular rhythm.  Pulmonary/Chest: She is in respiratory distress. She has rales.  Shallow breath sounds. Rales on left lower greater than right lower lung fields. Tachypneic.  Abdominal: Soft. Bowel sounds are normal. There is no tenderness. There is no rebound and no guarding.  Musculoskeletal: Normal range of motion.       Right lower leg: Normal. She exhibits no edema.       Left lower leg: Normal. She exhibits no edema.  Neurological: She is alert and oriented to person, place, and time.  Skin: Skin is warm and dry. No rash noted.  Psychiatric: She has a normal mood and affect.     ED Treatments / Results  Labs (all labs ordered are listed, but only abnormal results are displayed) Labs Reviewed  BASIC METABOLIC PANEL  CBC  PROTIME-INR  BRAIN NATRIURETIC PEPTIDE  I-STAT TROPONIN, ED  I-STAT CHEM 8, ED  I-STAT ARTERIAL BLOOD GAS, ED  I-STAT CG4 LACTIC ACID, ED    EKG EKG Interpretation  Date/Time:  Friday February 03 2018 23:48:41 EDT Ventricular Rate:  64 PR Interval:  142 QRS Duration: 96 QT Interval:  404 QTC Calculation: 416 R Axis:   80 Text Interpretation:  Normal sinus rhythm ST & T wave abnormality, consider inferolateral ischemia Abnormal ECG Nonspecific ST abnormality Confirmed by Ezequiel Essex [phone]) on 02/03/2018 11:57:58 PM   Radiology No results found.  Procedures Procedures (including critical care time) CRITICAL CARE Performed by: Dewaine Oats   Total critical care time: 35 minutes  Critical care time was exclusive of separately billable procedures and treating other patients.  Critical care was necessary to treat or prevent imminent or life-threatening deterioration.  Critical care was time spent  personally by me on the following activities: development of treatment plan with patient and/or surrogate as well as nursing, discussions with consultants, evaluation of patient's response to treatment, examination of patient, obtaining history from patient or surrogate, ordering and performing treatments and interventions, ordering and review of laboratory studies, ordering and review of radiographic studies, pulse oximetry and re-evaluation of patient's condition.  Medications Ordered in ED Medications - No data to display   Initial Impression / Assessment and Plan / ED Course  I have reviewed the triage vital signs and the nursing notes.  Pertinent labs & imaging results that were available during my care of the patient were reviewed by me and considered in my medical decision making (see chart for details).     Patient presents to ED with severe SOB, O2 sat noted to be 82% in triage, improves to 91% on 2L [place]. She is in moderate respiratory distress. Immediately moved to [facility].  Labs pending. BiPap ordered to relieve distress. Felt likely to be associated with CHF. IV lasix ordered with NTG drip for symptoms as well as targeting hypertension, 170/112.   Patient hemodynamically stable for now. Dr. Wyvonnia Dusky has been directly involved in patient care.   BiPap started. Patient states she feels she is breathing better. Would like to have it off as soon as possible. She is urinating after lasix. VSS.   Diagnosis favored to be CHF exacerbation. She has edema on CXR, elevated BNP, orthopnea and  stopped her regular dosing of Lasix one month ago. Her pH is 7.61, felt to by hyperventilation - plan for repeat.   Patient has been stabilized and hospitalist paged for admission.  Final Clinical Impressions(s) / ED Diagnoses   Final diagnoses:  None   1. CHF exacerbation   ED Discharge Orders    None       Dennie Bible 02/04/18 0210    Ezequiel Essex, MD 02/04/18 2238

## 2018-02-04 ENCOUNTER — Encounter (HOSPITAL_COMMUNITY): Payer: Self-pay | Admitting: Family Medicine

## 2018-02-04 DIAGNOSIS — Z940 Kidney transplant status: Secondary | ICD-10-CM

## 2018-02-04 DIAGNOSIS — N644 Mastodynia: Secondary | ICD-10-CM | POA: Diagnosis present

## 2018-02-04 DIAGNOSIS — N183 Chronic kidney disease, stage 3 (moderate): Secondary | ICD-10-CM | POA: Diagnosis not present

## 2018-02-04 DIAGNOSIS — R609 Edema, unspecified: Secondary | ICD-10-CM | POA: Diagnosis not present

## 2018-02-04 DIAGNOSIS — E876 Hypokalemia: Secondary | ICD-10-CM | POA: Diagnosis present

## 2018-02-04 DIAGNOSIS — I132 Hypertensive heart and chronic kidney disease with heart failure and with stage 5 chronic kidney disease, or end stage renal disease: Secondary | ICD-10-CM | POA: Diagnosis not present

## 2018-02-04 DIAGNOSIS — I252 Old myocardial infarction: Secondary | ICD-10-CM | POA: Diagnosis not present

## 2018-02-04 DIAGNOSIS — E1122 Type 2 diabetes mellitus with diabetic chronic kidney disease: Secondary | ICD-10-CM | POA: Diagnosis present

## 2018-02-04 DIAGNOSIS — J9601 Acute respiratory failure with hypoxia: Secondary | ICD-10-CM | POA: Diagnosis not present

## 2018-02-04 DIAGNOSIS — R079 Chest pain, unspecified: Secondary | ICD-10-CM

## 2018-02-04 DIAGNOSIS — Z794 Long term (current) use of insulin: Secondary | ICD-10-CM | POA: Diagnosis not present

## 2018-02-04 DIAGNOSIS — E1121 Type 2 diabetes mellitus with diabetic nephropathy: Secondary | ICD-10-CM | POA: Diagnosis not present

## 2018-02-04 DIAGNOSIS — I160 Hypertensive urgency: Secondary | ICD-10-CM

## 2018-02-04 DIAGNOSIS — Z953 Presence of xenogenic heart valve: Secondary | ICD-10-CM

## 2018-02-04 DIAGNOSIS — R0602 Shortness of breath: Secondary | ICD-10-CM | POA: Diagnosis not present

## 2018-02-04 DIAGNOSIS — C3491 Malignant neoplasm of unspecified part of right bronchus or lung: Secondary | ICD-10-CM | POA: Diagnosis not present

## 2018-02-04 DIAGNOSIS — G9340 Encephalopathy, unspecified: Secondary | ICD-10-CM | POA: Diagnosis not present

## 2018-02-04 DIAGNOSIS — I2721 Secondary pulmonary arterial hypertension: Secondary | ICD-10-CM | POA: Diagnosis present

## 2018-02-04 DIAGNOSIS — I481 Persistent atrial fibrillation: Secondary | ICD-10-CM | POA: Diagnosis not present

## 2018-02-04 DIAGNOSIS — R05 Cough: Secondary | ICD-10-CM | POA: Diagnosis not present

## 2018-02-04 DIAGNOSIS — Z923 Personal history of irradiation: Secondary | ICD-10-CM | POA: Diagnosis not present

## 2018-02-04 DIAGNOSIS — I350 Nonrheumatic aortic (valve) stenosis: Secondary | ICD-10-CM | POA: Diagnosis not present

## 2018-02-04 DIAGNOSIS — Z905 Acquired absence of kidney: Secondary | ICD-10-CM | POA: Diagnosis not present

## 2018-02-04 DIAGNOSIS — N186 End stage renal disease: Secondary | ICD-10-CM | POA: Diagnosis not present

## 2018-02-04 DIAGNOSIS — E785 Hyperlipidemia, unspecified: Secondary | ICD-10-CM | POA: Diagnosis not present

## 2018-02-04 DIAGNOSIS — Z8674 Personal history of sudden cardiac arrest: Secondary | ICD-10-CM | POA: Diagnosis not present

## 2018-02-04 DIAGNOSIS — I5033 Acute on chronic diastolic (congestive) heart failure: Secondary | ICD-10-CM | POA: Diagnosis not present

## 2018-02-04 DIAGNOSIS — Z955 Presence of coronary angioplasty implant and graft: Secondary | ICD-10-CM | POA: Diagnosis not present

## 2018-02-04 DIAGNOSIS — E873 Alkalosis: Secondary | ICD-10-CM | POA: Diagnosis not present

## 2018-02-04 DIAGNOSIS — Z7901 Long term (current) use of anticoagulants: Secondary | ICD-10-CM | POA: Diagnosis not present

## 2018-02-04 DIAGNOSIS — I2510 Atherosclerotic heart disease of native coronary artery without angina pectoris: Secondary | ICD-10-CM | POA: Diagnosis not present

## 2018-02-04 LAB — CBC
HEMATOCRIT: 41.5 % (ref 36.0–46.0)
HEMOGLOBIN: 11.9 g/dL — AB (ref 12.0–15.0)
MCH: 24.1 pg — ABNORMAL LOW (ref 26.0–34.0)
MCHC: 28.7 g/dL — AB (ref 30.0–36.0)
MCV: 84 fL (ref 78.0–100.0)
Platelets: 247 10*3/uL (ref 150–400)
RBC: 4.94 MIL/uL (ref 3.87–5.11)
RDW: 20.1 % — AB (ref 11.5–15.5)
WBC: 7.4 10*3/uL (ref 4.0–10.5)

## 2018-02-04 LAB — I-STAT ARTERIAL BLOOD GAS, ED
ACID-BASE EXCESS: 6 mmol/L — AB (ref 0.0–2.0)
BICARBONATE: 26.3 mmol/L (ref 20.0–28.0)
O2 Saturation: 100 %
TCO2: 27 mmol/L (ref 22–32)
pCO2 arterial: 26.2 mmHg — ABNORMAL LOW (ref 32.0–48.0)
pH, Arterial: 7.611 (ref 7.350–7.450)
pO2, Arterial: 176 mmHg — ABNORMAL HIGH (ref 83.0–108.0)

## 2018-02-04 LAB — I-STAT CHEM 8, ED
BUN: 22 mg/dL (ref 8–23)
CREATININE: 1.2 mg/dL — AB (ref 0.44–1.00)
Calcium, Ion: 1.27 mmol/L (ref 1.15–1.40)
Chloride: 104 mmol/L (ref 98–111)
Glucose, Bld: 268 mg/dL — ABNORMAL HIGH (ref 70–99)
HEMATOCRIT: 41 % (ref 36.0–46.0)
HEMOGLOBIN: 13.9 g/dL (ref 12.0–15.0)
POTASSIUM: 4 mmol/L (ref 3.5–5.1)
SODIUM: 139 mmol/L (ref 135–145)
TCO2: 27 mmol/L (ref 22–32)

## 2018-02-04 LAB — BASIC METABOLIC PANEL
ANION GAP: 8 (ref 5–15)
BUN: 19 mg/dL (ref 8–23)
CHLORIDE: 104 mmol/L (ref 98–111)
CO2: 28 mmol/L (ref 22–32)
Calcium: 10.2 mg/dL (ref 8.9–10.3)
Creatinine, Ser: 1.44 mg/dL — ABNORMAL HIGH (ref 0.44–1.00)
GFR calc Af Amer: 40 mL/min — ABNORMAL LOW (ref >60)
GFR calc non Af Amer: 35 mL/min — ABNORMAL LOW (ref >60)
GLUCOSE: 270 mg/dL — AB (ref 70–99)
POTASSIUM: 4.1 mmol/L (ref 3.5–5.1)
SODIUM: 140 mmol/L (ref 135–145)

## 2018-02-04 LAB — HEMOGLOBIN A1C
HEMOGLOBIN A1C: 8.7 % — AB (ref 4.8–5.6)
Mean Plasma Glucose: 202.99 mg/dL

## 2018-02-04 LAB — TROPONIN I
Troponin I: 0.04 ng/mL (ref <0.03)
Troponin I: 0.04 ng/mL (ref <0.03)
Troponin I: 0.04 ng/mL (ref <0.03)

## 2018-02-04 LAB — GLUCOSE, CAPILLARY
GLUCOSE-CAPILLARY: 250 mg/dL — AB (ref 70–99)
GLUCOSE-CAPILLARY: 250 mg/dL — AB (ref 70–99)
GLUCOSE-CAPILLARY: 187 mg/dL — AB (ref 70–99)
Glucose-Capillary: 223 mg/dL — ABNORMAL HIGH (ref 70–99)

## 2018-02-04 LAB — PROTIME-INR
INR: 3.53
Prothrombin Time: 35.1 seconds — ABNORMAL HIGH (ref 11.4–15.2)

## 2018-02-04 LAB — I-STAT TROPONIN, ED: Troponin i, poc: 0.05 ng/mL (ref 0.00–0.08)

## 2018-02-04 LAB — BRAIN NATRIURETIC PEPTIDE: B NATRIURETIC PEPTIDE 5: 1181.3 pg/mL — AB (ref 0.0–100.0)

## 2018-02-04 LAB — I-STAT CG4 LACTIC ACID, ED: Lactic Acid, Venous: 1.19 mmol/L (ref 0.5–1.9)

## 2018-02-04 MED ORDER — SODIUM CHLORIDE 0.9% FLUSH: 3.0000 mL | INTRAVENOUS | Status: DC | PRN

## 2018-02-04 MED ORDER — INSULIN GLARGINE 100 UNIT/ML ~~LOC~~ SOLN
5.0000 [IU] | Freq: Every day | SUBCUTANEOUS | Status: DC
  Administered 2018-02-04: 5 [IU] via SUBCUTANEOUS
  Filled 2018-02-04: qty 0.05

## 2018-02-04 MED ORDER — LISINOPRIL 10 MG PO TABS
5.0000 mg | ORAL_TABLET | Freq: Every day | ORAL | Status: DC
  Administered 2018-02-04 – 2018-02-07 (×4): 5 mg via ORAL
  Filled 2018-02-04 (×4): qty 1

## 2018-02-04 MED ORDER — SODIUM CHLORIDE 0.9 % IV SOLN: 250.0000 mL | INTRAVENOUS | Status: DC | PRN

## 2018-02-04 MED ORDER — TACROLIMUS 1 MG PO CAPS
3.0000 mg | ORAL_CAPSULE | Freq: Two times a day (BID) | ORAL | Status: DC
  Administered 2018-02-04 – 2018-02-08 (×9): 3 mg via ORAL
  Filled 2018-02-04 (×9): qty 3

## 2018-02-04 MED ORDER — OXYCODONE-ACETAMINOPHEN 5-325 MG PO TABS: 0.5000 | ORAL_TABLET | Freq: Four times a day (QID) | ORAL | Status: DC | PRN

## 2018-02-04 MED ORDER — ADULT MULTIVITAMIN W/MINERALS CH
1.0000 | ORAL_TABLET | Freq: Every day | ORAL | Status: DC
  Administered 2018-02-04 – 2018-02-08 (×5): 1 via ORAL
  Filled 2018-02-04 (×5): qty 1

## 2018-02-04 MED ORDER — ACETAMINOPHEN 325 MG PO TABS
650.0000 mg | ORAL_TABLET | ORAL | Status: DC | PRN
  Administered 2018-02-06: 650 mg via ORAL
  Filled 2018-02-04 (×2): qty 2

## 2018-02-04 MED ORDER — ROSUVASTATIN CALCIUM 20 MG PO TABS
20.0000 mg | ORAL_TABLET | Freq: Every day | ORAL | Status: DC
  Administered 2018-02-04 – 2018-02-07 (×4): 20 mg via ORAL
  Filled 2018-02-04 (×3): qty 1
  Filled 2018-02-04 (×4): qty 2
  Filled 2018-02-04: qty 1
  Filled 2018-02-04: qty 2
  Filled 2018-02-04: qty 1

## 2018-02-04 MED ORDER — ONDANSETRON HCL 4 MG/2ML IJ SOLN
4.0000 mg | Freq: Four times a day (QID) | INTRAMUSCULAR | Status: DC | PRN
  Administered 2018-02-04: 4 mg via INTRAVENOUS
  Filled 2018-02-04: qty 2

## 2018-02-04 MED ORDER — GABAPENTIN 100 MG PO CAPS
100.0000 mg | ORAL_CAPSULE | Freq: Three times a day (TID) | ORAL | Status: DC
  Administered 2018-02-04 – 2018-02-05 (×4): 100 mg via ORAL
  Filled 2018-02-04 (×4): qty 1

## 2018-02-04 MED ORDER — DAPSONE 25 MG PO TABS
25.0000 mg | ORAL_TABLET | Freq: Every day | ORAL | Status: DC
  Administered 2018-02-04 – 2018-02-08 (×5): 25 mg via ORAL
  Filled 2018-02-04 (×5): qty 1

## 2018-02-04 MED ORDER — AMLODIPINE BESYLATE 5 MG PO TABS
5.0000 mg | ORAL_TABLET | Freq: Every day | ORAL | Status: DC
  Administered 2018-02-04 – 2018-02-08 (×5): 5 mg via ORAL
  Filled 2018-02-04 (×5): qty 1

## 2018-02-04 MED ORDER — INSULIN ASPART 100 UNIT/ML ~~LOC~~ SOLN: 0.0000 [IU] | Freq: Every day | SUBCUTANEOUS | Status: DC

## 2018-02-04 MED ORDER — HYDRALAZINE HCL 20 MG/ML IJ SOLN: 10.0000 mg | INTRAMUSCULAR | Status: DC | PRN

## 2018-02-04 MED ORDER — MYCOPHENOLATE SODIUM 180 MG PO TBEC
360.0000 mg | DELAYED_RELEASE_TABLET | Freq: Two times a day (BID) | ORAL | Status: DC
  Administered 2018-02-04 – 2018-02-08 (×9): 360 mg via ORAL
  Filled 2018-02-04 (×9): qty 2

## 2018-02-04 MED ORDER — INSULIN ASPART 100 UNIT/ML ~~LOC~~ SOLN
0.0000 [IU] | Freq: Three times a day (TID) | SUBCUTANEOUS | Status: DC
  Administered 2018-02-04 (×3): 3 [IU] via SUBCUTANEOUS
  Administered 2018-02-05: 1 [IU] via SUBCUTANEOUS
  Administered 2018-02-05: 5 [IU] via SUBCUTANEOUS
  Administered 2018-02-05: 2 [IU] via SUBCUTANEOUS
  Administered 2018-02-06: 3 [IU] via SUBCUTANEOUS
  Administered 2018-02-06: 2 [IU] via SUBCUTANEOUS
  Administered 2018-02-06: 1 [IU] via SUBCUTANEOUS
  Administered 2018-02-07: 2 [IU] via SUBCUTANEOUS
  Administered 2018-02-07: 3 [IU] via SUBCUTANEOUS
  Administered 2018-02-08: 1 [IU] via SUBCUTANEOUS

## 2018-02-04 MED ORDER — DIPHENHYDRAMINE HCL 25 MG PO CAPS: 50.0000 mg | ORAL_CAPSULE | Freq: Four times a day (QID) | ORAL | Status: DC | PRN

## 2018-02-04 MED ORDER — LEVOTHYROXINE SODIUM 50 MCG PO TABS
50.0000 ug | ORAL_TABLET | Freq: Every day | ORAL | Status: DC
  Administered 2018-02-04 – 2018-02-08 (×5): 50 ug via ORAL
  Filled 2018-02-04 (×5): qty 1

## 2018-02-04 MED ORDER — FUROSEMIDE 10 MG/ML IJ SOLN
60.0000 mg | Freq: Two times a day (BID) | INTRAMUSCULAR | Status: DC
  Administered 2018-02-04 – 2018-02-05 (×2): 60 mg via INTRAVENOUS
  Filled 2018-02-04 (×2): qty 6

## 2018-02-04 MED ORDER — ASPIRIN 300 MG RE SUPP
300.0000 mg | Freq: Once | RECTAL | Status: AC
  Administered 2018-02-04: 300 mg via RECTAL
  Filled 2018-02-04: qty 1

## 2018-02-04 MED ORDER — FUROSEMIDE 10 MG/ML IJ SOLN
60.0000 mg | Freq: Once | INTRAMUSCULAR | Status: AC
  Administered 2018-02-04: 60 mg via INTRAVENOUS
  Filled 2018-02-04: qty 6

## 2018-02-04 MED ORDER — NITROGLYCERIN IN D5W 200-5 MCG/ML-% IV SOLN
0.0000 ug/min | Freq: Once | INTRAVENOUS | Status: AC
  Administered 2018-02-04: 10 ug/min via INTRAVENOUS
  Filled 2018-02-04: qty 250

## 2018-02-04 MED ORDER — METOPROLOL TARTRATE 25 MG PO TABS
25.0000 mg | ORAL_TABLET | Freq: Two times a day (BID) | ORAL | Status: DC
  Administered 2018-02-04 – 2018-02-07 (×7): 25 mg via ORAL
  Filled 2018-02-04 (×8): qty 1

## 2018-02-04 MED ORDER — FUROSEMIDE 10 MG/ML IJ SOLN: 60.0000 mg | Freq: Two times a day (BID) | INTRAMUSCULAR | Status: DC

## 2018-02-04 MED ORDER — SODIUM CHLORIDE 0.9% FLUSH
3.0000 mL | Freq: Two times a day (BID) | INTRAVENOUS | Status: DC
  Administered 2018-02-05 – 2018-02-08 (×6): 3 mL via INTRAVENOUS

## 2018-02-04 NOTE — Progress Notes (Signed)
Patient received via stretcher from E.R. With E.R. R.N. Alert and orin. Orin. To room no pain or complaints. R.N. Aware of arrival .

## 2018-02-04 NOTE — Progress Notes (Signed)
Pt taken off BIPAP at this time, placed on 2L [place]. Pt states she feels much better, and that her breathing feels better. Pt able to complete sentences, but does sound a little winded. Advised pt if she felt SOB again, to notify staff and RT will place pt back on BIPAP. RT will continue to monitor.

## 2018-02-04 NOTE — Progress Notes (Signed)
ANTICOAGULATION CONSULT NOTE - Initial Consult  Pharmacy Consult for Warfarin  Indication: atrial fibrillation  Patient Measurements: Height: 5' (152.4 cm) Weight: 164 lb (74.4 kg) IBW/kg (Calculated) : 45.5  Vital Signs: Temp: 99.3 F (37.4 C) (07/19 2346) Temp Source: Oral (07/19 2346) BP: 161/86 (07/20 0215) Pulse Rate: 74 (07/20 0215)  Labs: Recent Labs    02/04/18 0003 02/04/18 0031  HGB 11.9* 13.9  HCT 41.5 41.0  PLT 247  --   LABPROT 35.1*  --   INR 3.53  --   CREATININE 1.44* 1.20*    Estimated Creatinine Clearance: 37.1 mL/min (A) (by C-G formula based on SCr of 1.2 mg/dL (H)).   Medical History: Past Medical History:  Diagnosis Date  . Adenocarcinoma of lung, stage 1, right ([place]) 11/25/2017  . Anemia, iron deficiency    of chronic disease  . Aortic stenosis    a. Severe AS by echo 11/2012.  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 Asystole ([place])    a. During ENT surgery 2005: developed marked asystole requiring CPR, felt due to vagal reaction (cath nonobst dz).  . Carotid artery disease ([place])    a. Carotid Dopplers performed in August 2013 showed 40-59% left stenosis and 0-39% right; f/u recommended in 2 years.   . Cerebrovascular accident [facility]) 2009   a. LMCA infarct felt embolic 8299, maintained on chronic coumadin.; denies residual on 04/05/2013  . CHF (congestive heart failure) ([place])   . Cholelithiasis   . Chronic cough onset 03/2010   Dr Melvyn Novas  . Chronic Persistent Atrial Fibrillation 12/31/2008   Qualifier: Diagnosis of  By: [name]    . Coronary artery disease 05/2002   a. Ant MI 2003 s/p PTCA/stent to RCA.   . Diverticulosis of colon   . Esophagitis, reflux   . ESRD (end stage renal disease) ([place])    a. Mass on L kidney per pt s/p nephrectomy - pt states not cancer - [facility] notes indicate ESRD due to HTN/DM - was previously on HD. b. Kidney transplant 02/2011.  Marland Kitchen GERD (gastroesophageal reflux disease)   . Gout   .  Helicobacter pylori (H. pylori) infection    hx of  . Hemorrhoids   . Hx of colonic polyps    adenomatous  . Hyperlipidemia   . Hypertension   . Lung nodule seen on imaging study 04/07/2013   1.0 cm ground glass opacity RUL  . Myocardial infarction ([place]) 2003  . Pericardial effusion    a. Small by echo 11/2011.  Marland Kitchen Pruritic dermatitis    treated with steriods/UV light  . S/P aortic valve replacement with bioprosthetic valve and maze procedure 04/12/2013   51mm [facility] Ease bovine pericardial tissue valve   . S/P Maze operation for atrial fibrillation 04/12/2013   Complete bilateral atrial lesion set using bipolar radiofrequency and cryothermy ablation with clipping of LA appendage  . Sleep apnea    Pt says testing was positive, intolerant of CPAP.  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  . Type II diabetes mellitus ([place])   . Unspecified hearing loss    Assessment: 72 y/o F on warfarin PTA for afib, also has tissue AVR, INR is elevated this AM at 3.53 (goal 2-3), CBC good, warfarin dosing per outpatient anti-coag notes is 7.5 mg on Mondays and 5 mg all  other days.   Goal of Therapy:  INR 2-3 Monitor platelets by anticoagulation protocol: Yes   Plan:  -No warfarin tonight -Daily PT/INR -Resume warfarin as INR allows -Monitor for bleeding  Narda Bonds 02/04/2018,2:50 AM

## 2018-02-04 NOTE — H&P (Signed)
History and Physical    [name] WNU:[id number] DOB: Feb 09, 1946 DOA: 02/03/2018  PCP: [name], MD   Patient coming from: Home  Chief Complaint: SOB   HPI: [name] is a 72 y.o. 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 Patient reports that she was taken off of Lasix approximately a month ago and told to use it as needed only. She developed shortness of breath approximately 3 days ago, began using Lasix daily since then, but has continued to progress. She reports dyspnea at rest tonight. She had "squeezing" across her chest yesterday evening, but no longer experiencing that. She denies fevers or chills. Denies abdominal pain or change in urination.  ED Course: Upon arrival to the ED, patient is found to be afebrile, saturating 82% on room air, tachypneic, and hypertensive to 200/90. EKG features a sinus rhythm with inferolateral ST-T abnormalities. Chest x-ray is notable for cardiomegaly and moderate diffuse pulmonary interstitial edema. Chemistry panels notable for glucose of 270 and creatinine 1.44, lower than recent priors. CBC is unremarkable, lactic acid is normal, and INR is 3.53. Troponin is within normal limits and BNP is elevated at 1,181.patient was given full dose aspirin, 60 mg IV Lasix, started on nitroglycerin infusion, and BiPAP. She has begun to diurese, blood pressure has improved, she reports some subjective improvement, will be admitted for ongoing evaluation and management of suspected acute on chronic diastolic CHF.  Review of Systems:  All other systems reviewed and apart from HPI, are negative.  Past Medical  History:  Diagnosis Date  . Adenocarcinoma of lung, stage 1, right ([place]) 11/25/2017  . Anemia, iron deficiency    of chronic disease  . Aortic stenosis    a. Severe AS by echo 11/2012.  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 Asystole ([place])    a. During ENT surgery 2005: developed marked asystole requiring CPR, felt due to vagal reaction (cath nonobst dz).  . Carotid artery disease ([place])    a. Carotid Dopplers performed in August 2013 showed 40-59% left stenosis and 0-39% right; f/u recommended in 2 years.   . Cerebrovascular accident [facility]) 2009   a. LMCA infarct felt embolic 0347, maintained on chronic coumadin.; denies residual on 04/05/2013  . CHF (congestive heart failure) ([place])   . Cholelithiasis   . Chronic cough onset 03/2010   Dr Melvyn Novas  . Chronic Persistent Atrial Fibrillation 12/31/2008   Qualifier: Diagnosis of  By: [name]    . Coronary artery disease 05/2002   a. Ant MI 2003 s/p PTCA/stent to RCA.   . Diverticulosis of colon   . Esophagitis, reflux   . ESRD (end stage renal disease) ([place])    a. Mass on L kidney per pt s/p nephrectomy - pt states not cancer - [facility] notes indicate ESRD due to HTN/DM - was previously on HD. b. Kidney transplant 02/2011.  Marland Kitchen GERD (gastroesophageal reflux disease)   . Gout   . Helicobacter pylori (H. pylori) infection    hx of  . Hemorrhoids   . Hx of colonic polyps    adenomatous  . Hyperlipidemia   . Hypertension   . Lung nodule seen on imaging study 04/07/2013   1.0 cm  ground glass opacity RUL  . Myocardial infarction ([place]) 2003  . Pericardial effusion    a. Small by echo 11/2011.  Marland Kitchen Pruritic dermatitis    treated with steriods/UV light  . S/P aortic valve replacement with bioprosthetic valve and maze procedure 04/12/2013   64mm [facility] Ease bovine pericardial tissue valve   . S/P Maze operation for atrial fibrillation 04/12/2013   Complete bilateral atrial lesion set using bipolar radiofrequency and  cryothermy ablation with clipping of LA appendage  . Sleep apnea    Pt says testing was positive, intolerant of CPAP.  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  . Type II diabetes mellitus ([place])   . Unspecified hearing loss     Past Surgical History:  Procedure Laterality Date  . AORTIC VALVE REPLACEMENT N/A 04/12/2013   Procedure: AORTIC VALVE REPLACEMENT (AVR);  Surgeon: Rexene Alberts, MD;  Location: [place];  Service: Open Heart Surgery;  Laterality: N/A;  . ARTERIOVENOUS GRAFT PLACEMENT Left   . ARTERIOVENOUS GRAFT PLACEMENT Left    "I've had 2 on my left; had one removed" (04/05/2013)   . ARTERY EXPLORATION Right 04/11/2013   Procedure: ARTERY EXPLORATION;  Surgeon: Rexene Alberts, MD;  Location: [place];  Service: Open Heart Surgery;  Laterality: Right;  Right carotid artery exploration  . AV FISTULA PLACEMENT Right   . AV FISTULA REPAIR Right    "took it out" ((/18/2014)  . CARDIOVERSION  05/29/2012   Procedure: CARDIOVERSION;  Surgeon: Lelon Perla, MD;  Location: [facility] ENDOSCOPY;  Service: Cardiovascular;  Laterality: N/A;  . CHOLECYSTECTOMY  2009   with hernia removal  . CORONARY ANGIOPLASTY WITH STENT PLACEMENT Right    coronary artery  . INSERTION OF DIALYSIS CATHETER Bilateral    "over the years; took them both out" (04/05/2013)  . INTRAOPERATIVE TRANSESOPHAGEAL ECHOCARDIOGRAM N/A 04/11/2013   Procedure: INTRAOPERATIVE TRANSESOPHAGEAL ECHOCARDIOGRAM;  Surgeon: Rexene Alberts, MD;  Location: [place];  Service: Open Heart Surgery;  Laterality: N/A;  . INTRAOPERATIVE TRANSESOPHAGEAL ECHOCARDIOGRAM N/A 04/12/2013   Procedure: INTRAOPERATIVE TRANSESOPHAGEAL ECHOCARDIOGRAM;  Surgeon: Rexene Alberts, MD;  Location: [place];  Service: Open Heart Surgery;  Laterality: N/A;  . KIDNEY TRANSPLANT  03/16/11  . LEFT AND RIGHT HEART  CATHETERIZATION WITH CORONARY ANGIOGRAM N/A 04/06/2013   Procedure: LEFT AND RIGHT HEART CATHETERIZATION WITH CORONARY ANGIOGRAM;  Surgeon: Blane Ohara, MD;  Location: [facility] CATH LAB;  Service: Cardiovascular;  Laterality: N/A;  . MAZE N/A 04/12/2013   Procedure: MAZE;  Surgeon: Rexene Alberts, MD;  Location: [place];  Service: Open Heart Surgery;  Laterality: N/A;  . NASAL RECONSTRUCTION WITH SEPTAL REPAIR     "took it out" (04/05/2013)  . NEPHRECTOMY Left 2010   no CA on bx  . TONSILLECTOMY    . TOTAL ABDOMINAL HYSTERECTOMY    . TUBAL LIGATION       reports that she quit smoking about 16 years ago. Her smoking use included cigarettes. She has a 30.00 pack-year smoking history. She has never used smokeless tobacco. She reports that she does not drink alcohol or use drugs.  Allergies  Allergen Reactions  . Ibuprofen Nausea And Vomiting  . Sulfamethoxazole-Trimethoprim Itching, Swelling and Rash    Swelling of the face  . Sulfonamide Derivatives Itching, Swelling and Rash    Swelling of the face  . Tape Rash  Paper tape is ok  . Tramadol Nausea And Vomiting  . Doxycycline     nausea  . Hydrocil [Psyllium] Nausea And Vomiting  . Bactrim Itching, Swelling and Rash  . Red Dye Itching and Rash    Family History  Problem Relation Age of Onset  . Stroke Father   . Hypertension Mother   . Diabetes Other   . Diabetes Maternal Grandmother   . Diabetes Son   . Breast cancer Neg Hx      Prior to Admission medications   Medication Sig Start Date End Date Taking? Authorizing Provider  amLODipine (NORVASC) 10 MG tablet Take 5 mg by mouth daily. TAKE 1/2 Tablet by mouth daily     [provider]  amoxicillin (AMOXIL) 500 MG capsule TAKE 4 CAPSULES BY MOUTH 1 HOUR PRIOR TO DENTAL APPOINTMENT Patient not taking: Reported on 12/08/2017 10/14/17   Plotnikov, [name], MD  dapsone 25 MG tablet Take 25 mg by mouth daily.     [provider]  diphenhydrAMINE (BENADRYL) 50  MG capsule Take 1 capsule (50 mg total) by mouth every 6 (six) hours as needed for itching. 08/31/11   Plotnikov, [name], MD  furosemide (LASIX) 20 MG tablet Take 60 mg by mouth 2 (two) times daily.  04/11/17   [provider]  gabapentin (NEURONTIN) 100 MG capsule Take 100 mg by mouth 3 (three) times daily.    [provider]  glucose blood (ONETOUCH VERIO) test strip USE AS INSTRUCTED 4 TIMES A DAY (BEFORE MEALS) & AT BEDTIME (ICD 10-E11.9) 06/23/17   Plotnikov, [name], MD  glucose blood (ONETOUCH VERIO) test strip 1 each by Other route 3 (three) times daily as needed for other. Dx E11.9 10/12/17   Plotnikov, [name], MD  HUMALOG KWIKPEN 100 UNIT/ML KiwkPen IF SUGAR:140-200 USE 8 UNITS,201-230 10 UNITS,231-260 12 UNITS,261-300 14 UNITS,>301 16 UNITS 08/04/17   Plotnikov, [name], MD  Insulin Pen Needle (B-D UF III MINI PEN NEEDLES) 31G X 5 MM MISC USE TO ADMINISTER INSULIN FOUR TIMES A DAY DX E11.9 11/11/17   Plotnikov, [name], MD  KLOR-CON M20 20 MEQ tablet TAKE 2 TABLETS (40 MEQ TOTAL) BY MOUTH DAILY. Patient taking differently: Take 20 mEq by mouth daily.  02/24/17   Plotnikov, [name], MD  levothyroxine (SYNTHROID, LEVOTHROID) 25 MCG tablet TAKE 2 TABLETS (50 MCG TOTAL) BY MOUTH DAILY. 12/29/17   Plotnikov, [name], MD  lisinopril (PRINIVIL,ZESTRIL) 5 MG tablet Take 5 mg by mouth at bedtime.     [provider]  metoprolol tartrate (LOPRESSOR) 25 MG tablet TAKE 1 TABLET BY MOUTH TWICE A DAY 09/14/17   Sherran Needs, NP  Multiple Vitamins-Minerals (CENTRUM SILVER 50+WOMEN) TABS Take 1 tablet by mouth daily with breakfast.    [provider]  mycophenolate (MYFORTIC) 180 MG EC tablet Take 360 mg by mouth 2 (two) times daily.     [provider]  Jonetta Speak LANCETS 16X MISC Use to check blood sugars three times a day DX E11.9 10/12/17   Plotnikov, [name], MD  oxyCODONE-acetaminophen (ROXICET) 5-325 MG tablet Take 0.5-1 tablets by mouth  every 6 (six) hours as needed for severe pain. 09/02/17   Plotnikov, [name], MD  rosuvastatin (CRESTOR) 20 MG tablet Take 1 tablet (20 mg total) by mouth daily. 11/14/17   Lelon Perla, MD  tacrolimus (PROGRAF) 1 MG capsule Take 3 mg by mouth 2 (two) times daily. Take 3 tablets by mouth twice daily  [provider]  warfarin (COUMADIN) 5 MG tablet Take 5-7.5 mg by mouth See admin instructions. 7.5 mg on Monday and Friday, 5 mg all other days    [provider]    Physical Exam: Vitals:   02/04/18 0100 02/04/18 0115 02/04/18 0130 02/04/18 0215  BP: (!) 186/78 (!) 149/68 (!) 154/69 (!) 161/86  Pulse: 78 67 65 74  Resp: (!) 23 20 (!) 24 (!) 27  Temp:      TempSrc:      SpO2: 98% 97% 98% 96%  Weight:      Height:          Constitutional: Tachypneic, dyspneic with speech, no pallor, no diaphoresis  Eyes: PERTLA, lids and conjunctivae normal ENMT: Mucous membranes are moist. Posterior pharynx clear of any exudate or lesions.   Neck: normal, supple, no masses, no thyromegaly Respiratory: Diffuse rales, increased WOB. No pallor or cyanosis.   Cardiovascular: S1 & S2 heard, regular rate and rhythm. Trace edema bilatearlly. Abdomen: No distension, no tenderness, soft. Bowel sounds active.  Musculoskeletal: no clubbing / cyanosis. No joint deformity upper and lower extremities.    Skin: no significant rashes, lesions, ulcers. Warm, dry, well-perfused. Neurologic: CN 2-12 grossly intact. Sensation intact. Strength 5/5 in all 4 limbs.  Psychiatric: Alert and oriented to person, place, and situation. Pleasant, cooperative.     Labs on Admission: I have personally reviewed following labs and imaging studies  CBC: Recent Labs  Lab 02/04/18 0003 02/04/18 0031  WBC 7.4  --   HGB 11.9* 13.9  HCT 41.5 41.0  MCV 84.0  --   PLT 247  --    Basic Metabolic Panel: Recent Labs  Lab 02/04/18 0003 02/04/18 0031  NA 140 139  K 4.1 4.0  CL 104 104  CO2 28  --     GLUCOSE 270* 268*  BUN 19 22  CREATININE 1.44* 1.20*  CALCIUM 10.2  --    GFR: Estimated Creatinine Clearance: 37.1 mL/min (A) (by C-G formula based on SCr of 1.2 mg/dL (H)). Liver Function Tests: No results for input(s): AST, ALT, ALKPHOS, BILITOT, PROT, ALBUMIN in the last 168 hours. No results for input(s): LIPASE, AMYLASE in the last 168 hours. No results for input(s): AMMONIA in the last 168 hours. Coagulation Profile: Recent Labs  Lab 02/04/18 0003  INR 3.53   Cardiac Enzymes: No results for input(s): CKTOTAL, CKMB, CKMBINDEX, TROPONINI in the last 168 hours. BNP (last 3 results) No results for input(s): PROBNP in the last 8760 hours. HbA1C: No results for input(s): HGBA1C in the last 72 hours. CBG: No results for input(s): GLUCAP in the last 168 hours. Lipid Profile: No results for input(s): CHOL, HDL, LDLCALC, TRIG, CHOLHDL, LDLDIRECT in the last 72 hours. Thyroid Function Tests: No results for input(s): TSH, T4TOTAL, FREET4, T3FREE, THYROIDAB in the last 72 hours. Anemia Panel: No results for input(s): VITAMINB12, FOLATE, FERRITIN, TIBC, IRON, RETICCTPCT in the last 72 hours. Urine analysis:    Component Value Date/Time   COLORURINE YELLOW 07/02/2016 1440   APPEARANCEUR CLEAR 07/02/2016 1440   LABSPEC 1.025 07/02/2016 1440   PHURINE 6.5 07/02/2016 1440   GLUCOSEU 100 (A) 07/02/2016 1440   HGBUR SMALL (A) 07/02/2016 1440   BILIRUBINUR NEGATIVE 07/02/2016 1440   KETONESUR NEGATIVE 07/02/2016 1440   PROTEINUR 100 (A) 04/22/2013 1808   UROBILINOGEN 0.2 07/02/2016 1440   NITRITE NEGATIVE 07/02/2016 1440   LEUKOCYTESUR NEGATIVE 07/02/2016 1440   Sepsis Labs: @LABRCNTIP (procalcitonin:4,lacticidven:4) )No results found for this or  any previous visit (from the past 240 hour(s)).   Radiological Exams on Admission: Dg Chest Portable 1 View  Result Date: 02/04/2018 CLINICAL DATA:  Initial evaluation for acute shortness of breath, heart palpitations. EXAM: PORTABLE  CHEST 1 VIEW COMPARISON:  Prior radiograph from 11/25/2017. FINDINGS: Median sternotomy wires with underlying valvular prosthesis and left atrial appendage clip. Moderate cardiomegaly, stable from previous. Mediastinal silhouette within normal limits. Lungs hypoinflated. Moderate diffuse pulmonary interstitial edema. Suspected small bilateral pleural effusions. No consolidative airspace disease. No pneumothorax. No acute osseous abnormality. IMPRESSION: 1. Cardiomegaly with moderate diffuse pulmonary interstitial edema. 2. Suspected small bilateral pleural effusions. Electronically Signed   By: Jeannine Boga M.D.   On: 02/04/2018 00:14    EKG: Independently reviewed. Sinus rhythm, inferolateral ST-T abnormalities.   Assessment/Plan  1. Acute on chronic diastolic CHF; acute hypoxic respiratory failure  - Presents with 3 days of progressive SOB, now dyspneic at rest and saturating low 80's on rm air in ED  - Echo from May 2019 with preserved EF  - CXR with diffuse edema, BNP elevated  - Treated in ED with Lasix 60 mg IV and NTG  - She was started on BiPAP, can likely come off  - Continue diuresis with Lasix 60 mg IV q12h, continue ACE and beta-blocker, SLIV, follow daily wt and I/O's    2. CKD III; renal transplant recipient  - Hx of ESRD s/p renal transplant in 2012  - SCr is 1.44 on admission, better than recent priors  - She has hypertensive urgency on arrival, but this is likely secondary to hypervolemia  - Renally-dose medications, follow daily chem panel during diuresis, continue mycophenolate and tacrolimus    3. Atrial fibrillation; hx of AVR  - She is status-post MAZE procedure and aortic valve replacement  - In a sinus rhythm on admission  - CHADS-VASc 5 (gender, age, CHF, HTN, DM)  - INR 3.5 on admission; continue warfarin   4. Hypertension; hypertensive urgency  - BP elevated to 200/90 in ED, treated with NTG infusion  - Continue Norvasc, lisinopril, and Lopressor  -  Continue diuresis  - Use hydralazine IVP's prn    5. Chest pain  - No chest pain in ED, but reports experiencing a "squeezing" across her chest earlier in the course of this illness  - She had a low-risk stress test in May 2019  - EKG features inferolateral ST-T abnormalities similar to prior; troponin is wnl in ED  - Likely secondary to #1  - She was treated with full-dose ASA  - Continue cardiac monitoring, check serial troponin measurements, continue statin, beta-blocker, and ACE    6. NSCLC stage 1  - Patient is following with oncology and has recently undergone radiation for stage 1 adenocarcinoma of right lung   - She will continue outpatient follow-up   7. Insulin-dependent DM  - A1c was 8.7% in February  - Managed at home with Humalog per sliding-scale  - Check CBG's and continue SSI    DVT prophylaxis: warfarin  Code Status: Full  Family Communication: Discussed with patient  Consults called: none Admission status: Inpatient     Vianne Bulls, MD [facility] Pager [phone]  If 7PM-7AM, please contact night-coverage [email or web address] Password [facility]  02/04/2018, 2:44 AM

## 2018-02-04 NOTE — Progress Notes (Signed)
PROGRESS NOTE    [name]  TMH:[id number] DOB: [birth year] DOA: 02/03/2018 PCP: [name], MD   Brief Narrative:  [name] is [name] 72 y.o. 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 Patient reports that she was taken off of Lasix approximately Ashad [name] month ago and told to use it as needed only. She developed shortness of breath approximately 3 days ago, began using Lasix daily since then, but has continued to progress. She reports dyspnea at rest tonight. She had "squeezing" across her chest yesterday evening, but no longer experiencing that. She denies fevers or chills. Denies abdominal pain or change in urination.  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   Chronic Persistent Atrial Fibrillation   Renal transplant recipient   S/P aortic valve replacement with bioprosthetic valve and maze procedure   Acute respiratory failure with hypoxia (HCC)   CKD (chronic kidney disease), stage III (HCC)   Hypertensive urgency   Adenocarcinoma of lung, stage 1, right (HCC)   Chest pain   Acute on chronic diastolic CHF  Pulmonary Artery Hypertension  Acute Hypoxic Respiratory Failure  - Presents with 3 days of progressive SOB, now dyspneic at rest and saturating low 80's on rm air in ED  - Echo from May 2019 with preserved EF, elevated PASP  - CXR with diffuse edema, BNP elevated to ~1000 - Treated in ED with Lasix 60 mg IV and NTG gtt - D/c nitro gtt.  Continue lasix 60 BID - R>L  LEE -> follow [place] - Lisinopril, metoprolol - Daily weight, I/O  Wt Readings from Last 3 Encounters:  02/04/18 75.4 kg (166 lb 3.6 oz)  12/08/17 75 kg (165 lb 4.8 oz)  11/30/17 73.9 kg (163 lb)   2. CKD III; renal transplant recipient  - Hx of ESRD s/p renal transplant in 2012  - SCr is 1.44 on admission, better than recent priors  - Follow creatinine with diuresis - Renally-dose medications, follow daily chem panel during diuresis, continue mycophenolate and tacrolimus    3. Atrial fibrillation; hx of AVR with bioprosthetic valve - She is status-post MAZE procedure and aortic valve replacement  - In Nyrie [name] sinus rhythm on admission  - CHADS-VASc 5 (gender, age, CHF, HTN, DM)  - INR 3.5 elevated on admission - warfarin per pharmacy, appreciate assistance  4. Hypertension; hypertensive urgency  - BP elevated to 200/90 in ED on presentation, treated with NTG infusion  - improved now, follow with diuresis, norvasc, lisinopril, metoprolol - Use hydralazine IVP's prn    5. Chest pain  - No chest pain in ED, but reports experiencing [name] "squeezing" across her chest earlier in the course of this illness.  Suspect 2/2 dyspnea related to HF exacerbation.  - She had [name] low-risk stress test in May 2019  - EKG appears similar to priors.  Repeat this morning with T wave inversion in I, aVL, V6.  - She was treated with full-dose ASA  - Troponin mild and flat - Continue statin, beta blocker, ace - Follow  6. NSCLC stage 1  -  Patient is following with oncology and has recently undergone radiation for stage 1 adenocarcinoma of right lung   - She will continue outpatient follow-up   7. Insulin-dependent DM  - A1c was 8.7% in February -> recheck A1c. - Managed at home with Humalog per sliding-scale.  Add lantus. - Check CBG's and continue SSI   DVT prophylaxis: warfarin Code Status: full  Family Communication: none at bedside Disposition Plan: pending   Consultants:   none  Procedures:    LE [place] pending  Antimicrobials:  Anti-infectives (From admission, onward)   Start     Dose/Rate Route Frequency Ordered Stop   02/04/18 1000  dapsone tablet 25 mg     25 mg Oral Daily 02/04/18 0243        Subjective: Notes feeling better today. Yesterday she felt "weak and nervous".  She couldn't walk. Felt like something squeezing her lungs. Sx present for 3-4 days, worsening orthopnea, SOB.  Objective: Vitals:   02/04/18 0445 02/04/18 0545 02/04/18 0648 02/04/18 0840  BP:   (!) 179/85 (!) 117/45  Pulse: 68 70 82 72  Resp: (!) 22 (!) 23 20 18   Temp:   98 F (36.7 C) 98.8 F (37.1 C)  TempSrc:   Oral Oral  SpO2: 94% 94% 94% 97%  Weight:   75.4 kg (166 lb 3.6 oz)   Height:   5' (1.524 m)     Intake/Output Summary (Last 24 hours) at 02/04/2018 1441 Last data filed at 02/04/2018 1300 Gross per 24 hour  Intake 780 ml  Output 700 ml  Net 80 ml   Filed Weights   02/03/18 2347 02/04/18 0648  Weight: 74.4 kg (164 lb) 75.4 kg (166 lb 3.6 oz)    Examination:  General exam: Appears calm and comfortable  Respiratory system: Crackles at bases Cardiovascular system: S1 & S2 heard, RRR. +JVD. Gastrointestinal system: Abdomen is nondistended, soft and nontender.  Central nervous system: Alert and oriented. No focal neurological deficits. Extremities: R>L LEE Skin: No rashes, lesions or ulcers Psychiatry: Judgement and insight appear normal. Mood & affect appropriate.     Data Reviewed: I have personally reviewed following labs and imaging studies  CBC: Recent Labs  Lab 02/04/18 0003 02/04/18 0031  WBC 7.4  --   HGB 11.9* 13.9  HCT 41.5 41.0  MCV 84.0  --   PLT 247  --    Basic Metabolic Panel: Recent Labs  Lab 02/04/18 0003 02/04/18 0031  NA 140 139  K 4.1 4.0  CL 104 104  CO2 28  --   GLUCOSE 270* 268*  BUN 19 22  CREATININE 1.44* 1.20*  CALCIUM 10.2  --    GFR: Estimated Creatinine Clearance: 37.3 mL/min (Ree Alcalde) (by C-G formula based on SCr of 1.2  mg/dL (H)). Liver Function Tests: No results for input(s): AST, ALT, ALKPHOS, BILITOT, PROT, ALBUMIN in the last 168 hours. No results for input(s): LIPASE, AMYLASE in the last 168 hours. No results for input(s): AMMONIA in the last 168 hours. Coagulation Profile: Recent Labs  Lab 02/04/18 0003  INR 3.53   Cardiac Enzymes: Recent Labs  Lab 02/04/18 0245 02/04/18 0919  TROPONINI 0.04* 0.04*   BNP (last 3 results) No results for input(s): PROBNP in the last 8760 hours. HbA1C: No results for input(s): HGBA1C in the last 72 hours. CBG: Recent Labs  Lab 02/04/18 0659 02/04/18 1110  GLUCAP 250* 250*   Lipid Profile: No results for input(s): CHOL, HDL, LDLCALC, TRIG, CHOLHDL, LDLDIRECT in the  last 72 hours. Thyroid Function Tests: No results for input(s): TSH, T4TOTAL, FREET4, T3FREE, THYROIDAB in the last 72 hours. Anemia Panel: No results for input(s): VITAMINB12, FOLATE, FERRITIN, TIBC, IRON, RETICCTPCT in the last 72 hours. Sepsis Labs: Recent Labs  Lab 02/04/18 0032  LATICACIDVEN 1.19    No results found for this or any previous visit (from the past 240 hour(s)).       Radiology Studies: Dg Chest Portable 1 View  Result Date: 02/04/2018 CLINICAL DATA:  Initial evaluation for acute shortness of breath, heart palpitations. EXAM: PORTABLE CHEST 1 VIEW COMPARISON:  Prior radiograph from 11/25/2017. FINDINGS: Median sternotomy wires with underlying valvular prosthesis and left atrial appendage clip. Moderate cardiomegaly, stable from previous. Mediastinal silhouette within normal limits. Lungs hypoinflated. Moderate diffuse pulmonary interstitial edema. Suspected small bilateral pleural effusions. No consolidative airspace disease. No pneumothorax. No acute osseous abnormality. IMPRESSION: 1. Cardiomegaly with moderate diffuse pulmonary interstitial edema. 2. Suspected small bilateral pleural effusions. Electronically Signed   By: Jeannine Boga M.D.   On: 02/04/2018  00:14        Scheduled Meds: . amLODipine  5 mg Oral Daily  . dapsone  25 mg Oral Daily  . furosemide  60 mg Intravenous BID  . gabapentin  100 mg Oral TID  . insulin aspart  0-5 Units Subcutaneous QHS  . insulin aspart  0-9 Units Subcutaneous TID WC  . levothyroxine  50 mcg Oral QAC breakfast  . lisinopril  5 mg Oral QHS  . metoprolol tartrate  25 mg Oral BID  . multivitamin with minerals  1 tablet Oral Q breakfast  . mycophenolate  360 mg Oral BID  . rosuvastatin  20 mg Oral q1800  . sodium chloride flush  3 mL Intravenous Q12H  . tacrolimus  3 mg Oral BID   Continuous Infusions: . sodium chloride       LOS: 0 days    Time spent: over 30 min    Fayrene Helper, MD [facility] Pager [phone]  If 7PM-7AM, please contact night-coverage [email or web address] Password [facility] 02/04/2018, 2:41 PM

## 2018-02-05 ENCOUNTER — Other Ambulatory Visit: Payer: Self-pay

## 2018-02-05 ENCOUNTER — Inpatient Hospital Stay (HOSPITAL_COMMUNITY): Payer: Medicare Other

## 2018-02-05 DIAGNOSIS — R609 Edema, unspecified: Secondary | ICD-10-CM

## 2018-02-05 LAB — BLOOD GAS, VENOUS
ACID-BASE EXCESS: 8.3 mmol/L — AB (ref 0.0–2.0)
Acid-Base Excess: 8.9 mmol/L — ABNORMAL HIGH (ref 0.0–2.0)
Bicarbonate: 34.7 mmol/L — ABNORMAL HIGH (ref 20.0–28.0)
Bicarbonate: 35.7 mmol/L — ABNORMAL HIGH (ref 20.0–28.0)
DELIVERY SYSTEMS: POSITIVE
DRAWN BY: 3478
FIO2: 25
O2 SAT: 69.7 %
O2 Saturation: 51 %
PCO2 VEN: 72.8 mmHg — AB (ref 44.0–60.0)
PCO2 VEN: 79.5 mmHg — AB (ref 44.0–60.0)
PH VEN: 7.274 (ref 7.250–7.430)
PO2 VEN: 41.2 mmHg (ref 32.0–45.0)
Patient temperature: 98.6
Patient temperature: 98.6
pH, Ven: 7.299 (ref 7.250–7.430)
pO2, Ven: 31.8 mmHg — CL (ref 32.0–45.0)

## 2018-02-05 LAB — HEMOGLOBIN AND HEMATOCRIT, BLOOD
HEMATOCRIT: 35.2 % — AB (ref 36.0–46.0)
HEMOGLOBIN: 9.9 g/dL — AB (ref 12.0–15.0)

## 2018-02-05 LAB — BASIC METABOLIC PANEL
Anion gap: 10 (ref 5–15)
BUN: 23 mg/dL (ref 8–23)
CALCIUM: 9.2 mg/dL (ref 8.9–10.3)
CO2: 29 mmol/L (ref 22–32)
CREATININE: 1.69 mg/dL — AB (ref 0.44–1.00)
Chloride: 99 mmol/L (ref 98–111)
GFR, EST AFRICAN AMERICAN: 33 mL/min — AB (ref >60)
GFR, EST NON AFRICAN AMERICAN: 29 mL/min — AB (ref >60)
Glucose, Bld: 278 mg/dL — ABNORMAL HIGH (ref 70–99)
Potassium: 3.9 mmol/L (ref 3.5–5.1)
SODIUM: 138 mmol/L (ref 135–145)

## 2018-02-05 LAB — CBC
HCT: 35.2 % — ABNORMAL LOW (ref 36.0–46.0)
HEMOGLOBIN: 9.9 g/dL — AB (ref 12.0–15.0)
MCH: 24 pg — AB (ref 26.0–34.0)
MCHC: 28.1 g/dL — AB (ref 30.0–36.0)
MCV: 85.2 fL (ref 78.0–100.0)
Platelets: 230 10*3/uL (ref 150–400)
RBC: 4.13 MIL/uL (ref 3.87–5.11)
RDW: 19.3 % — ABNORMAL HIGH (ref 11.5–15.5)
WBC: 6.6 10*3/uL (ref 4.0–10.5)

## 2018-02-05 LAB — GLUCOSE, CAPILLARY
GLUCOSE-CAPILLARY: 257 mg/dL — AB (ref 70–99)
GLUCOSE-CAPILLARY: 166 mg/dL — AB (ref 70–99)
GLUCOSE-CAPILLARY: 143 mg/dL — AB (ref 70–99)
GLUCOSE-CAPILLARY: 143 mg/dL — AB (ref 70–99)
Glucose-Capillary: 179 mg/dL — ABNORMAL HIGH (ref 70–99)

## 2018-02-05 LAB — RAPID URINE DRUG SCREEN, HOSP PERFORMED
Amphetamines: NOT DETECTED
Barbiturates: NOT DETECTED
Benzodiazepines: NOT DETECTED
COCAINE: NOT DETECTED
OPIATES: NOT DETECTED
Tetrahydrocannabinol: NOT DETECTED

## 2018-02-05 LAB — MAGNESIUM: Magnesium: 1.8 mg/dL (ref 1.7–2.4)

## 2018-02-05 LAB — PROTIME-INR
INR: 2.39
PROTHROMBIN TIME: 25.9 s — AB (ref 11.4–15.2)

## 2018-02-05 MED ORDER — WARFARIN SODIUM 5 MG PO TABS
5.0000 mg | ORAL_TABLET | Freq: Once | ORAL | Status: AC
  Administered 2018-02-05: 5 mg via ORAL
  Filled 2018-02-05: qty 1

## 2018-02-05 MED ORDER — INSULIN GLARGINE 100 UNIT/ML ~~LOC~~ SOLN
10.0000 [IU] | Freq: Every day | SUBCUTANEOUS | Status: DC
  Administered 2018-02-05 – 2018-02-07 (×3): 10 [IU] via SUBCUTANEOUS
  Filled 2018-02-05 (×4): qty 0.1

## 2018-02-05 MED ORDER — INSULIN ASPART 100 UNIT/ML ~~LOC~~ SOLN
3.0000 [IU] | Freq: Three times a day (TID) | SUBCUTANEOUS | Status: DC
  Administered 2018-02-05 – 2018-02-08 (×9): 3 [IU] via SUBCUTANEOUS

## 2018-02-05 MED ORDER — NALOXONE HCL 0.4 MG/ML IJ SOLN
0.4000 mg | INTRAMUSCULAR | Status: DC | PRN
  Administered 2018-02-05: 0.4 mg via INTRAVENOUS
  Filled 2018-02-05: qty 1

## 2018-02-05 MED ORDER — WARFARIN SODIUM 5 MG PO TABS: 5.0000 mg | ORAL_TABLET | Freq: Once | ORAL | Status: DC

## 2018-02-05 MED ORDER — FUROSEMIDE 10 MG/ML IJ SOLN
80.0000 mg | Freq: Two times a day (BID) | INTRAMUSCULAR | Status: DC
  Administered 2018-02-05 – 2018-02-06 (×3): 80 mg via INTRAVENOUS
  Filled 2018-02-05 (×3): qty 8

## 2018-02-05 MED ORDER — WARFARIN - PHARMACIST DOSING INPATIENT
Freq: Every day | Status: DC
  Administered 2018-02-05: 19:00:00

## 2018-02-05 NOTE — Progress Notes (Signed)
CRITICAL VALUE ALERT  Critical Value:  Arterial CO2 79.5; Bicarb 35.2, pH 7.27  Date & Time Notied:  02/05/18 1708 Provider Notified: Provider, Dr. Riley Kill, MD notified [phone]  Orders Received/Actions taken: MD made aware.

## 2018-02-05 NOTE — Progress Notes (Signed)
RT placed patient on BIPAP. Patient is tolerating well at this time. RN is aware. RT will continue to monitor patient as needed.

## 2018-02-05 NOTE — Progress Notes (Signed)
Pt now on Bipap, which was set up by [facility], RRT.  Will continue to monitor LOC of patient.

## 2018-02-05 NOTE — Progress Notes (Addendum)
Pt pulled Bipap off, unable to tolerate, desat. At 78 %, placed on 4L [place] O2 stas 96 %.  Notified Katey, RRT unable to tolerate Bipap. Continue to monitor O2 sats and alertness.

## 2018-02-05 NOTE — Progress Notes (Signed)
ANTICOAGULATION CONSULT NOTE - Initial Consult  Pharmacy Consult for Warfarin  Indication: atrial fibrillation  Patient Measurements: Height: 5' (152.4 cm) Weight: 168 lb 14.4 oz (76.6 kg) IBW/kg (Calculated) : 45.5  Vital Signs: Temp: 98.3 F (36.8 C) (07/21 0830) Temp Source: Oral (07/21 0830) BP: 120/49 (07/21 0950) Pulse Rate: 57 (07/21 1248)  Labs: Recent Labs    02/04/18 0003 02/04/18 0031 02/04/18 0245 02/04/18 0919 02/04/18 1454 02/05/18 0645  HGB 11.9* 13.9  --   --   --  9.9*  HCT 41.5 41.0  --   --   --  35.2*  PLT 247  --   --   --   --  230  LABPROT 35.1*  --   --   --   --  25.9*  INR 3.53  --   --   --   --  2.39  CREATININE 1.44* 1.20*  --   --   --  1.69*  TROPONINI  --   --  0.04* 0.04* 0.04*  --     Estimated Creatinine Clearance: 26.7 mL/min (A) (by C-G formula based on SCr of 1.69 mg/dL (H)).   Assessment: 72 y/o F on warfarin PTA for afib, also has tissue AVR. INR elevated yesterday at 3.53,warfarin dosing per outpatient anti-coag notes is 7.5 mg on Mondays and 5 mg all other days.   INR therapeutic today at 2.39  Noted Hgb 9.9 down from 13.9, no bleeding issues noted, will continue to monitor. Platelets WNL.  Goal of Therapy:  INR 2-3 Monitor platelets by anticoagulation protocol: Yes   Plan:  -Resume PTA warfarin 5mg  x1 tonight -Daily PT/INR -Monitor for bleeding  Thank you for involving pharmacy in this patient's care.  Janae Bridgeman, PharmD PGY1 Pharmacy Resident Phone: [phone] 02/05/2018 12:52 PM

## 2018-02-05 NOTE — Progress Notes (Signed)
Bilateral lower extremity venous duplex has been completed. Negative for DVT.  02/05/18 1:44 PM [name] RVT

## 2018-02-05 NOTE — Progress Notes (Signed)
RN called about patient pulling Bipap off & not willing to wear it. I talked with the patient about her C02  being elevated on her labwork & told her to wear it a couple of hours if she was able to tolerate it. I placed the patient back on the Bipap & she is tolerating it so far. Luetta Nutting RN aware.

## 2018-02-05 NOTE — Progress Notes (Addendum)
CRITICAL VALUE ALERT  Critical Value:  CO2 (venous blood gas) 72.8  Date & Time Notied: 02/05/18 1212  Provider Notified: 1214, Dr. Riley Kill, MD  Orders Received/Actions taken: MD notified/made aware.  Received call from [place], RRT at 1212.

## 2018-02-05 NOTE — Progress Notes (Addendum)
PROGRESS NOTE    [name]  AUQ:[id number] DOB: 10-22-1945 DOA: 02/03/2018 PCP: [name], MD   Brief Narrative:  [name] is a 72 y.o. 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 Patient reports that she was taken off of Lasix approximately a month ago and told to use it as needed only. She developed shortness of breath approximately 3 days ago, began using Lasix daily since then, but has continued to progress. She reports dyspnea at rest tonight. She had "squeezing" across her chest yesterday evening, but no longer experiencing that. She denies fevers or chills. Denies abdominal pain or change in urination.  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   Chronic Persistent Atrial Fibrillation   Renal transplant recipient   S/P aortic valve replacement with bioprosthetic valve and maze procedure   Acute respiratory failure with hypoxia (HCC)   CKD (chronic kidney disease), stage III (HCC)   Hypertensive urgency   Adenocarcinoma of lung, stage 1, right (HCC)   Chest pain   Acute on chronic diastolic CHF  Pulmonary Artery Hypertension  Acute Hypoxic Respiratory Failure  - Presents with 3 days of progressive SOB, now dyspneic at rest and saturating low 80's on rm air in ED  - Echo from May 2019 with preserved EF, elevated PASP  - CXR with diffuse edema, BNP elevated to ~1000 - Treated in ED with Lasix 60 mg IV and NTG gtt - D/c nitro gtt.  Increase lasix 80 BID with  increase in weight and not great UOP recorded. - R>L LEE -> follow [place] - Lisinopril, metoprolol - Daily weight, I/O  Wt Readings from Last 3 Encounters:  02/05/18 76.6 kg (168 lb 14.4 oz)  12/08/17 75 kg (165 lb 4.8 oz)  11/30/17 73.9 kg (163 lb)   Acute Encephalopathy:  Pt sleepy this morning, difficult to arouse.  A&Ox3, no focal deficits, but difficult to arouse and with slightly slurred speech.  She take oxycodone at home, but hasn't received any pain meds here.  She notes she started to feel sleepy after getting lasix around 830. - Check VBG, poc BG (follow up VBG with normal pH, but pCO2 elevated -> will start her on bipap) - Give narcan -> she responded positively to this and was more alert with clearer speech (this was somewhat unexpected as she hasn't received any pain meds here - will follow utox and ctm) - Follow up  2. CKD III; renal transplant recipient  - Hx of ESRD s/p renal transplant in 2012  - SCr is 1.44 on admission, bumped to 1.69 today with diuresis, follow - Baseline ~1.6-1.9 - Follow creatinine with diuresis - Renally-dose medications, follow daily chem panel during diuresis, continue mycophenolate and tacrolimus    3. Atrial fibrillation; hx of AVR with bioprosthetic valve - She is status-post MAZE procedure and aortic valve replacement  - In a sinus rhythm on admission  - CHADS-VASc 5 (gender, age, CHF, HTN, DM)  - INR 3.5 elevated on admission, appropriate today - warfarin per pharmacy, appreciate assistance  4. Hypertension; hypertensive urgency  -  BP elevated to 200/90 in ED on presentation, treated with NTG infusion  - improved now, follow with diuresis, norvasc, lisinopril, metoprolol - Use hydralazine IVP's prn    5. Chest pain  - No chest pain in ED, but reports experiencing a "squeezing" across her chest earlier in the course of this illness.  Suspect 2/2 dyspnea related to HF exacerbation.  - She had a low-risk stress test in May 2019  - EKG  appears similar to priors.  Repeat this morning with T wave inversion in I, aVL, V6.  - She was treated with full-dose ASA  - Troponin mild and flat - Continue statin, beta blocker, ace - Follow  6. NSCLC stage 1  - Patient is following with oncology and has recently undergone radiation for stage 1 adenocarcinoma of right lung   - She will continue outpatient follow-up   7. Insulin-dependent DM  - A1c was 8.7% in February -> recheck A1c. - Managed at home with Humalog per sliding-scale.  Add lantus, increase to 10 tonight.. - Check CBG's and continue SSI   DVT prophylaxis: warfarin Code Status: full  Family Communication: none at bedside Disposition Plan: pending   Consultants:   none  Procedures:   LE [place] pending  Antimicrobials:  Anti-infectives (From admission, onward)   Start     Dose/Rate Route Frequency Ordered Stop   02/04/18 1000  dapsone tablet 25 mg     25 mg Oral Daily 02/04/18 0243        Subjective: Feels better. Very sleepy. Started after she got fluid medicine. No pain. A&Ox3.  Objective: Vitals:   02/04/18 2331 02/05/18 0556 02/05/18 0830 02/05/18 0950  BP: (!) 157/56 (!) 158/60 (!) 129/59 (!) 120/49  Pulse: 76 65 65   Resp: 20 20 17  (!) 22  Temp: 99.6 F (37.6 C) 99.3 F (37.4 C) 98.3 F (36.8 C)   TempSrc: Oral Oral Oral   SpO2: 96% 99% 99% 95%  Weight:  76.6 kg (168 lb 14.4 oz)    Height:        Intake/Output Summary (Last 24 hours) at 02/05/2018 1044 Last data filed at 02/05/2018 0900 Gross per 24 hour  Intake 1140 ml  Output 950 ml  Net 190 ml   Filed Weights   02/03/18 2347 02/04/18 0648 02/05/18 0556  Weight: 74.4 kg (164 lb) 75.4 kg (166 lb 3.6 oz) 76.6 kg (168 lb 14.4 oz)    Examination:  General: No acute distress. Cardiovascular: Heart sounds show a regular rate, and rhythm.  Lungs: Crackles at bases Abdomen: Soft, nontender, nondistended  Neurological: Alert and oriented 3.  Drowsy, but arousable, slightly  slurred speech. Moves all extremities 4. Cranial nerves II through XII intact. Skin: Warm and dry. No rashes or lesions. Extremities: No clubbing or cyanosis. LEE.  Psychiatric: Mood and affect are normal. Insight and judgment are appropriate.   Data Reviewed: I have personally reviewed following labs and imaging studies  CBC: Recent Labs  Lab 02/04/18 0003 02/04/18 0031 02/05/18 0645  WBC 7.4  --  6.6  HGB 11.9* 13.9 9.9*  HCT 41.5 41.0 35.2*  MCV 84.0  --  85.2  PLT 247  --  762   Basic Metabolic Panel: Recent Labs  Lab 02/04/18 0003 02/04/18 0031 02/05/18 0645  NA 140 139 138  K 4.1 4.0 3.9  CL 104 104 99  CO2 28  --  29  GLUCOSE 270* 268* 278*  BUN 19 22 23   CREATININE 1.44* 1.20*  1.69*  CALCIUM 10.2  --  9.2  MG  --   --  1.8   GFR: Estimated Creatinine Clearance: 26.7 mL/min (A) (by C-G formula based on SCr of 1.69 mg/dL (H)). Liver Function Tests: No results for input(s): AST, ALT, ALKPHOS, BILITOT, PROT, ALBUMIN in the last 168 hours. No results for input(s): LIPASE, AMYLASE in the last 168 hours. No results for input(s): AMMONIA in the last 168 hours. Coagulation Profile: Recent Labs  Lab 02/04/18 0003 02/05/18 0645  INR 3.53 2.39   Cardiac Enzymes: Recent Labs  Lab 02/04/18 0245 02/04/18 0919 02/04/18 1454  TROPONINI 0.04* 0.04* 0.04*   BNP (last 3 results) No results for input(s): PROBNP in the last 8760 hours. HbA1C: Recent Labs    02/04/18 1509  HGBA1C 8.7*   CBG: Recent Labs  Lab 02/04/18 0659 02/04/18 1110 02/04/18 1645 02/04/18 2125 02/05/18 0600  GLUCAP 250* 250* 223* 187* 257*   Lipid Profile: No results for input(s): CHOL, HDL, LDLCALC, TRIG, CHOLHDL, LDLDIRECT in the last 72 hours. Thyroid Function Tests: No results for input(s): TSH, T4TOTAL, FREET4, T3FREE, THYROIDAB in the last 72 hours. Anemia Panel: No results for input(s): VITAMINB12, FOLATE, FERRITIN, TIBC, IRON, RETICCTPCT in the last 72 hours. Sepsis  Labs: Recent Labs  Lab 02/04/18 0032  LATICACIDVEN 1.19    No results found for this or any previous visit (from the past 240 hour(s)).       Radiology Studies: Dg Chest Portable 1 View  Result Date: 02/04/2018 CLINICAL DATA:  Initial evaluation for acute shortness of breath, heart palpitations. EXAM: PORTABLE CHEST 1 VIEW COMPARISON:  Prior radiograph from 11/25/2017. FINDINGS: Median sternotomy wires with underlying valvular prosthesis and left atrial appendage clip. Moderate cardiomegaly, stable from previous. Mediastinal silhouette within normal limits. Lungs hypoinflated. Moderate diffuse pulmonary interstitial edema. Suspected small bilateral pleural effusions. No consolidative airspace disease. No pneumothorax. No acute osseous abnormality. IMPRESSION: 1. Cardiomegaly with moderate diffuse pulmonary interstitial edema. 2. Suspected small bilateral pleural effusions. Electronically Signed   By: Jeannine Boga M.D.   On: 02/04/2018 00:14        Scheduled Meds: . amLODipine  5 mg Oral Daily  . dapsone  25 mg Oral Daily  . furosemide  60 mg Intravenous BID  . insulin aspart  0-5 Units Subcutaneous QHS  . insulin aspart  0-9 Units Subcutaneous TID WC  . insulin aspart  3 Units Subcutaneous TID WC  . insulin glargine  10 Units Subcutaneous QHS  . levothyroxine  50 mcg Oral QAC breakfast  . lisinopril  5 mg Oral QHS  . metoprolol tartrate  25 mg Oral BID  . multivitamin with minerals  1 tablet Oral Q breakfast  . mycophenolate  360 mg Oral BID  . rosuvastatin  20 mg Oral q1800  . sodium chloride flush  3 mL Intravenous Q12H  . tacrolimus  3 mg Oral BID   Continuous Infusions: . sodium chloride       LOS: 1 day    Time spent: over 30 min    Fayrene Helper, MD [facility] Pager [phone]  If 7PM-7AM, please contact night-coverage [email or web address] Password [facility] 02/05/2018, 10:44 AM

## 2018-02-05 NOTE — Progress Notes (Addendum)
POCT CBG 166, pt still sleepy but arouseable.  Updated provider, Dr. Riley Kill, MD.  Narcan administered ar 1024 showed some improvement in alertness.

## 2018-02-05 NOTE — Progress Notes (Addendum)
Pt appeared to be lethargic, but arouseable with slightly slurred speech, and was falling asleep frequently.  Provider, Dr. Riley Kill assessed patient, who reported to provider that opioid pain medications were taken at home prior to admission.  Per order by Dr. Riley Kill, Narcan 0.4 mg once was administered, venous blood gases, and POCT CBG was obtained.

## 2018-02-06 ENCOUNTER — Other Ambulatory Visit: Payer: Self-pay | Admitting: *Deleted

## 2018-02-06 ENCOUNTER — Inpatient Hospital Stay (HOSPITAL_COMMUNITY): Payer: Medicare Other

## 2018-02-06 LAB — URINALYSIS, ROUTINE W REFLEX MICROSCOPIC
BILIRUBIN URINE: NEGATIVE
Glucose, UA: NEGATIVE mg/dL
KETONES UR: NEGATIVE mg/dL
Nitrite: NEGATIVE
PROTEIN: 100 mg/dL — AB
Specific Gravity, Urine: 1.008 (ref 1.005–1.030)
pH: 7 (ref 5.0–8.0)

## 2018-02-06 LAB — BASIC METABOLIC PANEL
Anion gap: 8 (ref 5–15)
BUN: 22 mg/dL (ref 8–23)
CO2: 34 mmol/L — AB (ref 22–32)
Calcium: 9.1 mg/dL (ref 8.9–10.3)
Chloride: 94 mmol/L — ABNORMAL LOW (ref 98–111)
Creatinine, Ser: 1.78 mg/dL — ABNORMAL HIGH (ref 0.44–1.00)
GFR calc Af Amer: 31 mL/min — ABNORMAL LOW (ref >60)
GFR, EST NON AFRICAN AMERICAN: 27 mL/min — AB (ref >60)
GLUCOSE: 201 mg/dL — AB (ref 70–99)
POTASSIUM: 3.5 mmol/L (ref 3.5–5.1)
SODIUM: 136 mmol/L (ref 135–145)

## 2018-02-06 LAB — BLOOD GAS, VENOUS
ACID-BASE EXCESS: 8.9 mmol/L — AB (ref 0.0–2.0)
BICARBONATE: 33.9 mmol/L — AB (ref 20.0–28.0)
O2 SAT: 88.3 %
PATIENT TEMPERATURE: 98.6
PO2 VEN: 56.5 mmHg — AB (ref 32.0–45.0)
pCO2, Ven: 56.1 mmHg (ref 44.0–60.0)
pH, Ven: 7.398 (ref 7.250–7.430)

## 2018-02-06 LAB — GLUCOSE, CAPILLARY
GLUCOSE-CAPILLARY: 234 mg/dL — AB (ref 70–99)
GLUCOSE-CAPILLARY: 112 mg/dL — AB (ref 70–99)
Glucose-Capillary: 173 mg/dL — ABNORMAL HIGH (ref 70–99)
Glucose-Capillary: 134 mg/dL — ABNORMAL HIGH (ref 70–99)

## 2018-02-06 LAB — CBC
HEMATOCRIT: 33.6 % — AB (ref 36.0–46.0)
Hemoglobin: 9.9 g/dL — ABNORMAL LOW (ref 12.0–15.0)
MCH: 24.1 pg — AB (ref 26.0–34.0)
MCHC: 29.5 g/dL — ABNORMAL LOW (ref 30.0–36.0)
MCV: 82 fL (ref 78.0–100.0)
PLATELETS: 248 10*3/uL (ref 150–400)
RBC: 4.1 MIL/uL (ref 3.87–5.11)
RDW: 18.5 % — ABNORMAL HIGH (ref 11.5–15.5)
WBC: 7.2 10*3/uL (ref 4.0–10.5)

## 2018-02-06 LAB — PROTIME-INR
INR: 1.56
Prothrombin Time: 18.6 seconds — ABNORMAL HIGH (ref 11.4–15.2)

## 2018-02-06 LAB — MAGNESIUM: Magnesium: 1.9 mg/dL (ref 1.7–2.4)

## 2018-02-06 MED ORDER — GUAIFENESIN-DM 100-10 MG/5ML PO SYRP
5.0000 mL | ORAL_SOLUTION | ORAL | Status: DC | PRN
  Administered 2018-02-06: 5 mL via ORAL
  Filled 2018-02-06: qty 5

## 2018-02-06 MED ORDER — WARFARIN SODIUM 7.5 MG PO TABS
7.5000 mg | ORAL_TABLET | Freq: Once | ORAL | Status: AC
  Administered 2018-02-06: 7.5 mg via ORAL
  Filled 2018-02-06: qty 1

## 2018-02-06 NOTE — Progress Notes (Signed)
ANTICOAGULATION CONSULT NOTE - Follow Up Consult  Pharmacy Consult for Warfarin  Indication: atrial fibrillation  Patient Measurements: Height: 5' (152.4 cm) Weight: 166 lb 9.6 oz (75.6 kg) IBW/kg (Calculated) : 45.5  Vital Signs: Temp: 97.9 F (36.6 C) (07/22 0931) Temp Source: Oral (07/22 0931) BP: 148/51 (07/22 0455) Pulse Rate: 71 (07/22 0455)  Labs: Recent Labs    02/04/18 0003 02/04/18 0031 02/04/18 0245 02/04/18 0919 02/04/18 1454 02/05/18 0645 02/05/18 1856 02/06/18 0428  HGB 11.9* 13.9  --   --   --  9.9* 9.9* 9.9*  HCT 41.5 41.0  --   --   --  35.2* 35.2* 33.6*  PLT 247  --   --   --   --  230  --  248  LABPROT 35.1*  --   --   --   --  25.9*  --  18.6*  INR 3.53  --   --   --   --  2.39  --  1.56  CREATININE 1.44* 1.20*  --   --   --  1.69*  --  1.78*  TROPONINI  --   --  0.04* 0.04* 0.04*  --   --   --     Estimated Creatinine Clearance: 25.2 mL/min (A) (by C-G formula based on SCr of 1.78 mg/dL (H)).   Assessment: 72 y/o F on warfarin PTA for afib, also has tissue AVR. INR elevated yesterday at 3.53,warfarin dosing per outpatient anti-coag notes is 7.5 mg on Mondays and 5 mg all other days.   INR is sub-therapeutic today.  Noted Hgb 9.9 down from 13.9, no bleeding issues noted, will continue to monitor. Platelets WNL.  Goal of Therapy:  INR 2-3 Monitor platelets by anticoagulation protocol: Yes   Plan:  -Warfarin 7.5mg  x1 tonight -Daily PT/INR -Monitor for bleeding  Thank you for involving pharmacy in this patient's care.  Albertina Parr, PharmD., BCPS Clinical Pharmacist Clinical phone for 02/06/18 until 3:30pm: [phone] If after 3:30pm, please refer to [facility] for unit-specific pharmacist

## 2018-02-06 NOTE — Consult Note (Signed)
   [facility] CM Inpatient Consult   02/06/2018  [name] 12-24-1945 [id number]   Alerted of the patient's hospitalization by [name]. Patient is currently active with [facility] for chronic disease management services.  Patient has been engaged by a [place]. Will follow for progress.  Patient was on BIPAP.  Chart reviewed and will follow for disposition and needs.  Of note, [facility] Care Management services does not replace or interfere with any services that are needed or arranged by inpatient case management or social work.  For additional questions or referrals please contact:   Natividad Brood, RN BSN [facility] Liaison  [phone] business mobile phone Toll free office [phone]

## 2018-02-06 NOTE — Progress Notes (Signed)
PROGRESS NOTE    [name]  TKW:[id number] DOB: [birth year] DOA: 02/03/2018 PCP: [name], MD   Brief Narrative:  [name] is [name] 72 y.o. 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 Patient reports that she was taken off of Lasix approximately [name] month ago and told to use it as needed only. She developed shortness of breath approximately 3 days ago, began using Lasix daily since then, but has continued to progress. She reports dyspnea at rest tonight. She had "squeezing" across her chest yesterday evening, but no longer experiencing that. She denies fevers or chills. Denies abdominal pain or change in urination.  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   Chronic Persistent Atrial Fibrillation   Renal transplant recipient   S/P aortic valve replacement with bioprosthetic valve and maze procedure   Acute respiratory failure with hypoxia (HCC)   CKD (chronic kidney disease), stage III (HCC)   Hypertensive urgency   Adenocarcinoma of lung, stage 1, right (HCC)   Chest pain   Acute on chronic diastolic CHF  Pulmonary Artery Hypertension  Acute Hypoxic Respiratory Failure  - Presents with 3 days of progressive SOB, now dyspneic at rest and saturating low 80's on rm air in ED  - Echo from May 2019 with preserved EF, elevated PASP  - CXR with diffuse edema, BNP elevated to ~1000 - Treated in ED with Lasix 60 mg IV and NTG gtt - D/c nitro gtt.  lasix 80 BID (will continue  current dose with improvement in CXR and pt symptoms, but UOP recorded not great, weight is down from yesterday - will decide on dose tomorrow based on exam, renal function) - R>L LEE -> follow [place] - Lisinopril, metoprolol - Daily weight, I/O - question component of COPD with hypercarbia on VBG requiring bipap - outpatient PFT's  Wt Readings from Last 3 Encounters:  02/06/18 75.6 kg (166 lb 9.6 oz)  12/08/17 75 kg (165 lb 4.8 oz)  11/30/17 73.9 kg (163 lb)   Acute Encephalopathy  Hypercarbia:  Pt sleepy and difficult to arouse on 7/22.  [name]&Ox3, no focal deficits, but difficult to arouse and with slightly slurred speech.  She take oxycodone at home, but hasn't received any pain meds here.  She noted she started to feel sleepy after getting lasix around 830.  She woke up with narcan (but had had no opiates here and utox was negative).  She was also noted to be hypercarbic.  Placed on bipap.  Husband saw her later in day and thought she was close to her baseline.     - CO2 this morning improved - Will try off bipap tonight and check AM VBG tomorrow  Fever:  Denies sx.  Follow CXR, urinalysis, and blood cultures.  2. CKD III; renal transplant recipient  - Hx of ESRD s/p renal transplant in 2012  - SCr is 1.44 on admission, bumped to 1.78 today with diuresis (likely developing contraction alkalosis - hold diuresis after today and follow repeat labs, renal function) - Baseline ~1.6-1.9 - Follow creatinine with diuresis -  Renally-dose medications, follow daily chem panel during diuresis, continue mycophenolate and tacrolimus    3. Atrial fibrillation; hx of AVR with bioprosthetic valve - She is status-post MAZE procedure and aortic valve replacement  - In [place] sinus rhythm on admission  - CHADS-VASc 5 (gender, age, CHF, HTN, DM)  - INR 3.5 elevated on admission, now low -> follow - warfarin per pharmacy, appreciate assistance  4. Hypertension; hypertensive urgency  - BP elevated to 200/90 in  ED on presentation, treated with NTG infusion  - improved now, follow with diuresis, norvasc, lisinopril, metoprolol - Use hydralazine IVP's prn    5. Chest pain  - No chest pain in ED, but reports experiencing [name] "squeezing" across her chest earlier in the course of this illness.  Suspect 2/2 dyspnea related to HF exacerbation.  - She had [name] low-risk stress test in May 2019  - EKG appears similar to priors.  Repeat this morning with T wave inversion in I, aVL, V6.  - She was treated with full-dose ASA  - Troponin mild and flat - Continue statin, beta blocker, ace - Follow  6. NSCLC stage 1  - Patient is following with oncology and has recently undergone radiation for stage 1 adenocarcinoma of right lung   - She will continue outpatient follow-up   7. Insulin-dependent DM  - A1c was 8.7% in February -> recheck A1c. - Managed at home with Humalog per sliding-scale.  Add lantus, increase to 10.  Mealtime.  - Check CBG's and continue SSI   DVT prophylaxis: warfarin Code Status: full  Family Communication: husband 7/21 over phone Disposition Plan: pending   Consultants:   none  Procedures:   LE [place] pending  Antimicrobials:  Anti-infectives (From admission, onward)   Start     Dose/Rate Route Frequency Ordered Stop   02/04/18 1000  dapsone tablet 25 mg     25 mg Oral Daily 02/04/18 0243        Subjective: Feeling progressively better. No pain.   Sleepy, but feeling better than yesterday.  Objective: Vitals:   02/06/18 0455 02/06/18 0653 02/06/18 0900 02/06/18 0931  BP: (!) 148/51     Pulse: 71     Resp: (!) 28  15   Temp: (!) 101 F (38.3 C) 98.6 F (37 C)  97.9 F (36.6 C)  TempSrc: Oral Oral  Oral  SpO2: 96%  91%   Weight: 75.6 kg (166 lb 9.6 oz)     Height:        Intake/Output Summary (Last 24 hours) at 02/06/2018 1005 Last data filed at 02/06/2018 0946 Gross per 24 hour  Intake 940 ml  Output 1100 ml  Net -160 ml   Filed Weights   02/04/18 0648  02/05/18 0556 02/06/18 0455  Weight: 75.4 kg (166 lb 3.6 oz) 76.6 kg (168 lb 14.4 oz) 75.6 kg (166 lb 9.6 oz)    Examination:  General: No acute distress. Cardiovascular: Heart sounds show [name] regular rate, and rhythm. Lungs: Crackles at bases Abdomen: Soft, nontender, nondistended with normal active bowel sounds. No masses. No hepatosplenomegaly. Neurological: Alert and oriented 3, drowsy. Moves all extremities 4. Cranial nerves II through XII grossly intact. Skin: Warm and dry. No rashes or lesions. Extremities: No clubbing or cyanosis. 1+ LEE Psychiatric: Mood and affect are normal. Insight and judgment are appropriate.   Data Reviewed: I have personally reviewed following labs and imaging studies  CBC: Recent Labs  Lab 02/04/18 0003 02/04/18 0031 02/05/18 0645 02/05/18 1856  02/06/18 0428  WBC 7.4  --  6.6  --  7.2  HGB 11.9* 13.9 9.9* 9.9* 9.9*  HCT 41.5 41.0 35.2* 35.2* 33.6*  MCV 84.0  --  85.2  --  82.0  PLT 247  --  230  --  542   Basic Metabolic Panel: Recent Labs  Lab 02/04/18 0003 02/04/18 0031 02/05/18 0645 02/06/18 0428  NA 140 139 138 136  K 4.1 4.0 3.9 3.5  CL 104 104 99 94*  CO2 28  --  29 34*  GLUCOSE 270* 268* 278* 201*  BUN 19 22 23 22   CREATININE 1.44* 1.20* 1.69* 1.78*  CALCIUM 10.2  --  9.2 9.1  MG  --   --  1.8 1.9   GFR: Estimated Creatinine Clearance: 25.2 mL/min ([name]) (by C-G formula based on SCr of 1.78 mg/dL (H)). Liver Function Tests: No results for input(s): AST, ALT, ALKPHOS, BILITOT, PROT, ALBUMIN in the last 168 hours. No results for input(s): LIPASE, AMYLASE in the last 168 hours. No results for input(s): AMMONIA in the last 168 hours. Coagulation Profile: Recent Labs  Lab 02/04/18 0003 02/05/18 0645 02/06/18 0428  INR 3.53 2.39 1.56   Cardiac Enzymes: Recent Labs  Lab 02/04/18 0245 02/04/18 0919 02/04/18 1454  TROPONINI 0.04* 0.04* 0.04*   BNP (last 3 results) No results for input(s): PROBNP in the last 8760  hours. HbA1C: Recent Labs    02/04/18 1509  HGBA1C 8.7*   CBG: Recent Labs  Lab 02/05/18 1058 02/05/18 1114 02/05/18 1553 02/05/18 2150 02/06/18 0625  GLUCAP 166* 179* 143* 143* 173*   Lipid Profile: No results for input(s): CHOL, HDL, LDLCALC, TRIG, CHOLHDL, LDLDIRECT in the last 72 hours. Thyroid Function Tests: No results for input(s): TSH, T4TOTAL, FREET4, T3FREE, THYROIDAB in the last 72 hours. Anemia Panel: No results for input(s): VITAMINB12, FOLATE, FERRITIN, TIBC, IRON, RETICCTPCT in the last 72 hours. Sepsis Labs: Recent Labs  Lab 02/04/18 0032  LATICACIDVEN 1.19    No results found for this or any previous visit (from the past 240 hour(s)).       Radiology Studies: No results found.      Scheduled Meds: . amLODipine  5 mg Oral Daily  . dapsone  25 mg Oral Daily  . furosemide  80 mg Intravenous BID  . insulin aspart  0-5 Units Subcutaneous QHS  . insulin aspart  0-9 Units Subcutaneous TID WC  . insulin aspart  3 Units Subcutaneous TID WC  . insulin glargine  10 Units Subcutaneous QHS  . levothyroxine  50 mcg Oral QAC breakfast  . lisinopril  5 mg Oral QHS  . metoprolol tartrate  25 mg Oral BID  . multivitamin with minerals  1 tablet Oral Q breakfast  . mycophenolate  360 mg Oral BID  . rosuvastatin  20 mg Oral q1800  . sodium chloride flush  3 mL Intravenous Q12H  . tacrolimus  3 mg Oral BID  . Warfarin - Pharmacist Dosing Inpatient   Does not apply q1800   Continuous Infusions: . sodium chloride       LOS: 2 days    Time spent: over 30 min    Fayrene Helper, MD [facility] Pager [phone]  If 7PM-7AM, please contact night-coverage [email or web address] Password [facility] 02/06/2018, 10:05 AM

## 2018-02-06 NOTE — Patient Outreach (Signed)
[facility]) Care Management  02/06/2018  Janette Harvie Carboni 1946/06/14 [id number]   RN Health Coach Monthly Outreach  Referral Date:01/12/2017 Referral Source:[facility] RN Case Manager Reason for Referral:CHF education Insurance:Medicare   Outreach Attempt:  Received notification patient hospitalized at [facility] for heart failure exacerbation requiring BIPAP and IV lasix.  Message sent to [facility] to check on patient needs at discharge.  Plan:  RN Health Coach will await Hospital Liaison's recommendations after speaking with patient.  [place] Coach [phone] [email or web address]

## 2018-02-07 ENCOUNTER — Other Ambulatory Visit: Payer: Self-pay | Admitting: Internal Medicine

## 2018-02-07 LAB — GLUCOSE, CAPILLARY
GLUCOSE-CAPILLARY: 186 mg/dL — AB (ref 70–99)
GLUCOSE-CAPILLARY: 105 mg/dL — AB (ref 70–99)
Glucose-Capillary: 253 mg/dL — ABNORMAL HIGH (ref 70–99)
Glucose-Capillary: 153 mg/dL — ABNORMAL HIGH (ref 70–99)

## 2018-02-07 LAB — BASIC METABOLIC PANEL
Anion gap: 11 (ref 5–15)
BUN: 23 mg/dL (ref 8–23)
CHLORIDE: 93 mmol/L — AB (ref 98–111)
CO2: 33 mmol/L — ABNORMAL HIGH (ref 22–32)
Calcium: 9.1 mg/dL (ref 8.9–10.3)
Creatinine, Ser: 1.71 mg/dL — ABNORMAL HIGH (ref 0.44–1.00)
GFR calc Af Amer: 33 mL/min — ABNORMAL LOW (ref >60)
GFR calc non Af Amer: 28 mL/min — ABNORMAL LOW (ref >60)
Glucose, Bld: 164 mg/dL — ABNORMAL HIGH (ref 70–99)
POTASSIUM: 3 mmol/L — AB (ref 3.5–5.1)
SODIUM: 137 mmol/L (ref 135–145)

## 2018-02-07 LAB — BLOOD GAS, VENOUS
ACID-BASE EXCESS: 11.3 mmol/L — AB (ref 0.0–2.0)
Bicarbonate: 36.4 mmol/L — ABNORMAL HIGH (ref 20.0–28.0)
O2 SAT: 75.2 %
PATIENT TEMPERATURE: 98.6
pCO2, Ven: 59 mmHg (ref 44.0–60.0)
pH, Ven: 7.407 (ref 7.250–7.430)
pO2, Ven: 43.9 mmHg (ref 32.0–45.0)

## 2018-02-07 LAB — MAGNESIUM: MAGNESIUM: 1.8 mg/dL (ref 1.7–2.4)

## 2018-02-07 LAB — CBC
HCT: 34.1 % — ABNORMAL LOW (ref 36.0–46.0)
HEMOGLOBIN: 10 g/dL — AB (ref 12.0–15.0)
MCH: 24.1 pg — AB (ref 26.0–34.0)
MCHC: 29.3 g/dL — ABNORMAL LOW (ref 30.0–36.0)
MCV: 82.2 fL (ref 78.0–100.0)
Platelets: 225 10*3/uL (ref 150–400)
RBC: 4.15 MIL/uL (ref 3.87–5.11)
RDW: 18.3 % — ABNORMAL HIGH (ref 11.5–15.5)
WBC: 5.6 10*3/uL (ref 4.0–10.5)

## 2018-02-07 LAB — PROTIME-INR
INR: 1.43
PROTHROMBIN TIME: 17.3 s — AB (ref 11.4–15.2)

## 2018-02-07 MED ORDER — WARFARIN SODIUM 7.5 MG PO TABS
7.5000 mg | ORAL_TABLET | Freq: Once | ORAL | Status: AC
  Administered 2018-02-07: 7.5 mg via ORAL
  Filled 2018-02-07: qty 1

## 2018-02-07 MED ORDER — POTASSIUM CHLORIDE CRYS ER 20 MEQ PO TBCR
40.0000 meq | EXTENDED_RELEASE_TABLET | ORAL | Status: AC
  Administered 2018-02-07 (×2): 40 meq via ORAL
  Filled 2018-02-07 (×2): qty 2

## 2018-02-07 MED ORDER — ALUM & MAG HYDROXIDE-SIMETH 200-200-20 MG/5ML PO SUSP
30.0000 mL | ORAL | Status: DC | PRN
  Administered 2018-02-07: 30 mL via ORAL
  Filled 2018-02-07: qty 30

## 2018-02-07 MED ORDER — FUROSEMIDE 80 MG PO TABS
80.0000 mg | ORAL_TABLET | Freq: Every day | ORAL | Status: DC
  Administered 2018-02-07: 80 mg via ORAL
  Filled 2018-02-07: qty 1

## 2018-02-07 MED ORDER — METOPROLOL TARTRATE 12.5 MG HALF TABLET
12.5000 mg | ORAL_TABLET | Freq: Two times a day (BID) | ORAL | Status: DC
  Administered 2018-02-07 – 2018-02-08 (×2): 12.5 mg via ORAL
  Filled 2018-02-07 (×2): qty 1

## 2018-02-07 NOTE — Progress Notes (Signed)
SATURATION QUALIFICATIONS: (This note is used to comply with regulatory documentation for home oxygen)  Patient Saturations on Room Air at Rest = 93%; 85% if talking at rest  Patient Saturations on Room Air while Ambulating = 85%  Patient Saturations on 2 Liters of oxygen while Ambulating = 90%  Please briefly explain why patient needs home oxygen:Pt desats with any activity.  Will need home O2. Thanks.  [name] [phone] (pager)

## 2018-02-07 NOTE — Care Management Important Message (Signed)
Important Message  Patient Details  Name: [name] MRN: [id number] Date of Birth: 18-Oct-1945   Medicare Important Message Given:  Yes    Barb Merino Sharnette Kitamura 02/07/2018, 3:17 PM

## 2018-02-07 NOTE — Evaluation (Addendum)
Physical Therapy Evaluation Patient Details Name: [name] MRN: [id number] DOB: 24-Sep-1945 Today's Date: 02/07/2018   History of Present Illness  [name] is a 72 y.o. 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 Patient reports that she was taken off of Lasix approximately a month ago and told to use it as needed only. She developed shortness of breath approximately 3 days ago, began using Lasix daily since then, but has continued to progress. She reports dyspnea at rest tonight. She had "squeezing" across her chest day before admit and day of admit.   Clinical Impression  Pt admitted with above diagnosis. Pt currently with functional limitations due to the deficits listed below (see PT Problem List). Pt was able to ambulate with overall good stability without device.  Pt desat to 85% with talking and with ambulation.  May need home O2. Pt has a flight of stairs she has to ascend and descend daily.  Will follow acutely.  Pt will benefit from skilled PT to increase their independence and safety with mobility to allow discharge to the venue listed below.      Follow Up Recommendations Home health PT;Supervision - Intermittent    Equipment Recommendations  Other (comment)(home O2, pt requests scooter for outdoor use)    Recommendations for Other Services       Precautions / Restrictions Precautions Precautions: Fall Restrictions Weight Bearing Restrictions: No      Mobility  Bed Mobility Overal bed mobility: Independent                Transfers Overall transfer level: Independent                   Ambulation/Gait Ambulation/Gait assistance: Supervision Gait Distance (Feet): 150 Feet Assistive device: None Gait Pattern/deviations: Decreased stride length;Step-through pattern   Gait velocity interpretation: <1.31 ft/sec, indicative of household ambulator General Gait Details: Pt was able to ambulate without device without LOB.  Desat to 85% on RA.   Stairs            Wheelchair Mobility    Modified Rankin (Stroke Patients Only)       Balance Overall balance assessment: Needs assistance Sitting-balance support: No upper extremity supported;Feet supported Sitting balance-Leahy Scale: Fair     Standing balance support: No upper extremity supported;During functional activity Standing balance-Leahy Scale: Fair Standing balance comment: able to stand statically without assist or device                             Pertinent Vitals/Pain Pain Assessment: No/denies pain  SATURATION QUALIFICATIONS: (This note is used to comply with regulatory documentation for home oxygen)  Patient Saturations on Room Air at Rest = 93%; 85% if talking at rest  Patient Saturations on Room Air while Ambulating = 85%  Patient Saturations on 2 Liters of oxygen while Ambulating = 90%  Please briefly explain why patient needs home oxygen:Pt desats with any activity.    Home Living Family/patient expects to be discharged to:: Private residence Living Arrangements: Children Available Help at Discharge: Family;Available 24 hours/day(husband and 29 year old grandson) Type of Home: House Home Access: Stairs to enter   [employer] of Steps: 1  Home Layout: Two level;Bed/bath upstairs Home Equipment: Cane - single point;Shower seat;Walker - 4 wheels Additional Comments: wants a scooter for groceries    Prior Function Level of Independence: Independent with assistive device(s);Needs assistance   Gait / Transfers Assistance Needed: used cane most of the time per pt, used  rollator when outdoors  ADL's / Homemaking Assistance Needed: Bathed and dressed self per pt  Comments: Pt states she cares for her 21 year old grandson     Hand Dominance   Dominant Hand: Right    Extremity/Trunk Assessment   Upper Extremity Assessment Upper Extremity Assessment: Defer to OT evaluation    Lower Extremity Assessment Lower Extremity Assessment: Generalized weakness    Cervical / Trunk Assessment Cervical / Trunk Assessment: Normal  Communication   Communication: No difficulties  Cognition Arousal/Alertness: Awake/alert Behavior During Therapy: WFL for tasks assessed/performed Overall Cognitive Status: Within Functional Limits for tasks assessed                                        General Comments General comments (skin integrity, edema, etc.): 50 bpm, 93% on RA at rest, desats to 85% with talking and with ambulation    Exercises     Assessment/Plan    PT Assessment Patient needs continued PT services  PT Problem List Decreased activity tolerance;Decreased balance;Decreased mobility;Decreased safety awareness;Decreased knowledge of use of DME;Cardiopulmonary status limiting activity;Decreased knowledge of precautions       PT Treatment Interventions DME instruction;Gait training;Functional mobility training;Therapeutic activities;Stair training;Therapeutic exercise;Balance training;Patient/family education    PT Goals (Current goals can be found in the Care Plan section)  Acute Rehab PT Goals Patient Stated Goal: to go home and care for grandson PT Goal Formulation: With patient Time For Goal Achievement: 02/21/18 Potential to Achieve Goals: Good    Frequency Min 3X/week   Barriers to discharge        Co-evaluation               AM-PAC PT "6 Clicks" Daily Activity  Outcome Measure Difficulty turning over in bed (including adjusting bedclothes, sheets and blankets)?: None Difficulty moving from lying on back to  sitting on the side of the bed? : None Difficulty sitting down on and standing up from a chair with arms (e.g., wheelchair, bedside commode, etc,.)?: None Help needed moving to and from a bed to chair (including a wheelchair)?: A Little Help needed walking in hospital room?: A Little Help needed climbing 3-5 steps with a railing? : A Lot 6 Click Score: 20    End of Session Equipment Utilized During Treatment: Gait belt;Oxygen Activity Tolerance: Patient limited by fatigue Patient left: in chair;with call bell/phone within reach Nurse Communication: Mobility status PT Visit Diagnosis: Unsteadiness on feet (R26.81);Muscle weakness (generalized) (M62.81)    Time: 5449-2010 PT Time Calculation (min) (ACUTE ONLY): 14 min   Charges:   PT Evaluation $PT Eval Moderate Complexity: 1 Mod     PT G Codes:        Jazon Jipson,PT Acute Rehabilitation [phone] (pager)   Denice Paradise 02/07/2018, 11:56 AM

## 2018-02-07 NOTE — Progress Notes (Signed)
PROGRESS NOTE    [name]  YQM:[id number] DOB: April 11, 1946 DOA: 02/03/2018 PCP: [name], MD   Brief Narrative:  [name] is a 72 y.o. 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 Patient reports that she was taken off of Lasix approximately a month ago and told to use it as needed only. She developed shortness of breath approximately 3 days ago, began using Lasix daily since then, but has continued to progress. She reports dyspnea at rest tonight. She had "squeezing" across her chest yesterday evening, but no longer experiencing that. She denies fevers or chills. Denies abdominal pain or change in urination.  She was admitted for HF exacerbation.  She's improved with diuresis (initially on 60 IV BID, but increased to 80 IV BID).  Today she's symptomatically better, but continues to desat with activity and require supplemental O2 as well as having some crackles on exam.  She also had fever on 7/21 and blood cx are pending.  Plan for transition to PO lasix today and hopefully d/c tomorrow if possible.    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   Chronic Persistent Atrial Fibrillation   Renal transplant recipient   S/P aortic valve replacement with bioprosthetic valve and maze procedure   Acute respiratory failure with hypoxia (HCC)   CKD (chronic kidney disease), stage III (HCC)   Hypertensive urgency   Adenocarcinoma of lung, stage 1, right (HCC)   Chest pain   Acute  on chronic diastolic CHF  Pulmonary Artery Hypertension  Acute Hypoxic Respiratory Failure  - Presents with 3 days of progressive SOB, now dyspneic at rest and saturating low 80's on rm air in ED - Overall improved today, still crackles on exam and desatting with ambulation, but feels symptomatically improved.      - Echo from May 2019 with preserved EF, elevated PASP  - CXR with diffuse edema, BNP elevated to ~1000 - Treated in ED with Lasix 60 mg IV and NTG gtt - D/c nitro gtt.  lasix 80 BID IV -> will try 80 PO lasix x1 today (of note, this is much higher than her previous home dose -> 20 mg). - R>L LEE -> follow [place] - Lisinopril, metoprolol - Daily weight, I/O - question component of COPD with hypercarbia on VBG requiring bipap - recommend outpatient PFT's  Wt Readings from Last 3 Encounters:  02/07/18 74.5 kg (164 lb 3.2 oz)  12/08/17 75 kg (165 lb 4.8 oz)  11/30/17 73.9 kg (163 lb)   Acute Encephalopathy  Hypercarbia:  Pt sleepy and difficult to arouse on 7/22.  A&Ox3, no focal deficits, but difficult to arouse and with slightly slurred speech.  She take oxycodone at home, but hasn't received any pain meds here.  She noted she started to feel sleepy after getting lasix around 830.  She woke up with narcan (but had had no opiates here and utox was negative).  She was also noted to be hypercarbic.  Placed on bipap.  Husband saw her later in day and thought she was close to her baseline.  With response to narcan/hypercarbia would seem c/w hypercarbia 2/2 opiates, but negative utox, a bit unclear.  Will continue to monitor.     - CO2 this 7/22 improved (apparently she was only on bipap briefly overnight) - Will check VBG again today to ensure she's not retaining (off bipap all last night)  Fever:  Denies sx.  Follow CXR (with decreased pulm edema), urinalysis (with many bacteria and WBC's), and blood cultures (NGTD x 1).  She's had no recurrent fevers and denies sx of UTI.  Will follow urine  culture and continue to follow blood cx.  2. CKD III; renal transplant recipient  - Hx of ESRD s/p renal transplant in 2012  - SCr is 1.44 on admission, 1.71 today, mild contraction alkalosis. will transition to PO diuretics as noted above - Baseline ~1.6-1.9 - Follow creatinine with diuresis - Renally-dose medications, follow daily chem panel during diuresis, continue mycophenolate and tacrolimus    3. Atrial fibrillation; hx of AVR with bioprosthetic valve - She is status-post MAZE procedure and aortic valve replacement  - In a sinus rhythm on admission  - CHADS-VASc 5 (gender, age, CHF, HTN, DM)  - INR 3.5 elevated on admission, now low -> follow - warfarin per pharmacy, appreciate assistance - HR is in 50's - 60's will decrease metop  4. Hypertension; hypertensive urgency  - BP elevated to 200/90 in ED on presentation, treated with NTG infusion  - improved now, follow with diuresis, norvasc, lisinopril, metoprolol  5. Chest pain  - No chest pain in ED, but reports experiencing a "squeezing" across her chest earlier in the course of this illness.  Suspect 2/2 dyspnea related to HF exacerbation.  - She had a low-risk stress test in May 2019  - EKG appears similar to priors.  Repeat this morning with T wave inversion in I, aVL, V6.  - She was treated with full-dose ASA  - Troponin mild and flat - Continue statin, beta blocker, ace - Follow up outpatient   6. NSCLC stage 1  - Patient is following with oncology and has recently undergone radiation for stage 1 adenocarcinoma of right lung   - She will continue outpatient follow-up  - CXR from 7/22 notes "hazy increased density in R pulm apex unchanged from 3 days ago likely reflects known pulmonary nodule which may have increased in size" -> would recommend close f/u with oncology as outpatient   7. Insulin-dependent DM  - A1c was 8.7% in February -> recheck A1c. - Managed at home with Humalog per sliding-scale.  Add lantus,  increase to 10.  Mealtime.  - Check CBG's and continue SSI   # Hypokalemia: replete, follow   DVT prophylaxis: warfarin Code Status: full  Family Communication: husband 7/21 over phone Disposition Plan: pending   Consultants:   none  Procedures:   LE [place] pending  Antimicrobials:  Anti-infectives (From admission, onward)   Start     Dose/Rate Route Frequency Ordered Stop   02/04/18 1000  dapsone tablet 25 mg     25 mg Oral Daily 02/04/18 0243        Subjective: Feeling progressively better. Feels close to her baseline.   Objective: Vitals:   02/07/18 0533 02/07/18 0735 02/07/18 0800 02/07/18 0815  BP: (!) 149/68 (!) 137/48 (!) 149/52 (!) 149/52  Pulse:    (!) 59  Resp: (!) 24 (!) 36 (!) 25 (!) 28  Temp:  (!) 96.9 F (36.1 C)  98.5 F (36.9 C)  TempSrc:  Axillary  Oral  SpO2: (!) 89% 93% 92% 95%  Weight:      Height:        Intake/Output Summary (Last 24 hours) at 02/07/2018 1224 Last data filed at 02/07/2018 0830 Gross per 24 hour  Intake 600 ml  Output 1300 ml  Net -700 ml   Filed Weights   02/05/18 0556 02/06/18 0455 02/07/18 0532  Weight: 76.6 kg (168 lb 14.4 oz) 75.6 kg (166 lb 9.6 oz) 74.5 kg (164 lb 3.2 oz)    Examination:  General: No acute distress. Cardiovascular: Heart sounds show a regular rate, and rhythm Lungs: Crackles at bases, overall improved Abdomen: Soft, nontender, nondistended  Neurological: Alert and oriented 3. Moves all extremities 4 with equal strength. Cranial nerves II through XII grossly intact. Skin: Warm and dry. No rashes or lesions. Extremities: No clubbing or cyanosis. No LEE.  Psychiatric: Mood and affect are normal. Insight and judgment are appropriate.  Data Reviewed: I have personally reviewed following labs and imaging studies  CBC: Recent Labs  Lab 02/04/18 0003 02/04/18 0031 02/05/18 0645 02/05/18 1856 02/06/18 0428 02/07/18 0628  WBC 7.4  --  6.6  --  7.2 5.6  HGB 11.9* 13.9 9.9* 9.9* 9.9* 10.0*    HCT 41.5 41.0 35.2* 35.2* 33.6* 34.1*  MCV 84.0  --  85.2  --  82.0 82.2  PLT 247  --  230  --  248 852   Basic Metabolic Panel: Recent Labs  Lab 02/04/18 0003 02/04/18 0031 02/05/18 0645 02/06/18 0428 02/07/18 0628  NA 140 139 138 136 137  K 4.1 4.0 3.9 3.5 3.0*  CL 104 104 99 94* 93*  CO2 28  --  29 34* 33*  GLUCOSE 270* 268* 278* 201* 164*  BUN 19 22 23 22 23   CREATININE 1.44* 1.20* 1.69* 1.78* 1.71*  CALCIUM 10.2  --  9.2 9.1 9.1  MG  --   --  1.8 1.9 1.8   GFR: Estimated Creatinine Clearance: 26 mL/min (A) (by C-G formula based on SCr of 1.71 mg/dL (H)). Liver Function Tests: No results for input(s): AST, ALT, ALKPHOS, BILITOT, PROT, ALBUMIN in the last 168 hours. No results for input(s): LIPASE, AMYLASE in the last 168 hours. No results for input(s): AMMONIA in the last 168 hours. Coagulation Profile: Recent Labs  Lab 02/04/18 0003 02/05/18 0645 02/06/18 0428 02/07/18 0628  INR 3.53 2.39 1.56 1.43   Cardiac Enzymes: Recent Labs  Lab 02/04/18 0245 02/04/18 0919 02/04/18 1454  TROPONINI 0.04* 0.04* 0.04*   BNP (last 3 results) No results for input(s): PROBNP in the last 8760 hours. HbA1C: Recent Labs    02/04/18 1509  HGBA1C 8.7*   CBG: Recent Labs  Lab 02/06/18 1109 02/06/18 1616 02/06/18 2144 02/07/18 0624 02/07/18 1109  GLUCAP 134* 234* 112* 186* 105*   Lipid Profile: No results for input(s): CHOL, HDL, LDLCALC, TRIG, CHOLHDL, LDLDIRECT in the last 72 hours. Thyroid Function Tests: No results for input(s): TSH, T4TOTAL, FREET4, T3FREE, THYROIDAB in the last 72 hours. Anemia Panel: No results for input(s): VITAMINB12, FOLATE, FERRITIN, TIBC, IRON, RETICCTPCT in the last 72 hours. Sepsis Labs: Recent Labs  Lab 02/04/18 0032  LATICACIDVEN 1.19    No results found for this or any previous visit (from the past 240 hour(s)).       Radiology Studies: Dg Chest 2 View  Result Date: 02/06/2018 CLINICAL DATA:  Shortness of breath and  mid chest pain-tightness developed 3 days ago. Also cough and chest  congestion. History of lung malignancy, coronary artery disease and CHF. Previous coronary stent placement and aortic valve replacement. EXAM: CHEST - 2 VIEW COMPARISON:  Portable chest x-ray of February 03, 2018 FINDINGS: The lungs are well-expanded. The interstitial markings are markedly improved. There is hazy increased density in the right apex little changed from the study of 3 days ago but more conspicuous than on the study of Nov 25, 2017. The cardiac silhouette remains enlarged. The pulmonary vascularity is less engorged and more distinct. The sternal wires are intact. The left atrial appendage clip and the prosthetic aortic valve ring appear to be in stable position. There is calcification in the wall of the aortic arch. The observed bony thorax is unremarkable. IMPRESSION: Improved appearance of the thorax with decreased pulmonary edema. Stable cardiomegaly. Hazy increased density in the right pulmonary apex unchanged from the study of 3 days ago likely reflects a known pulmonary nodule which may have increased in size. Electronically Signed   By: David  Martinique M.D.   On: 02/06/2018 10:14        Scheduled Meds: . amLODipine  5 mg Oral Daily  . dapsone  25 mg Oral Daily  . insulin aspart  0-5 Units Subcutaneous QHS  . insulin aspart  0-9 Units Subcutaneous TID WC  . insulin aspart  3 Units Subcutaneous TID WC  . insulin glargine  10 Units Subcutaneous QHS  . levothyroxine  50 mcg Oral QAC breakfast  . lisinopril  5 mg Oral QHS  . metoprolol tartrate  25 mg Oral BID  . multivitamin with minerals  1 tablet Oral Q breakfast  . mycophenolate  360 mg Oral BID  . potassium chloride  40 mEq Oral Q4H  . rosuvastatin  20 mg Oral q1800  . sodium chloride flush  3 mL Intravenous Q12H  . tacrolimus  3 mg Oral BID  . Warfarin - Pharmacist Dosing Inpatient   Does not apply q1800   Continuous Infusions: . sodium chloride        LOS: 3 days    Time spent: over 30 min    Fayrene Helper, MD [facility] Pager [phone]  If 7PM-7AM, please contact night-coverage [email or web address] Password [facility] 02/07/2018, 12:24 PM

## 2018-02-07 NOTE — Progress Notes (Signed)
ANTICOAGULATION CONSULT NOTE - Follow Up Consult  Pharmacy Consult for Warfarin  Indication: atrial fibrillation  Patient Measurements: Height: 5' (152.4 cm) Weight: 164 lb 3.2 oz (74.5 kg) IBW/kg (Calculated) : 45.5  Vital Signs: Temp: 98.3 F (36.8 C) (07/23 1231) Temp Source: Oral (07/23 1231) BP: 125/55 (07/23 1235) Pulse Rate: 53 (07/23 1231)  Labs: Recent Labs    02/04/18 1454  02/05/18 0645 02/05/18 1856 02/06/18 0428 02/07/18 0628  HGB  --    < > 9.9* 9.9* 9.9* 10.0*  HCT  --    < > 35.2* 35.2* 33.6* 34.1*  PLT  --   --  230  --  248 225  LABPROT  --   --  25.9*  --  18.6* 17.3*  INR  --   --  2.39  --  1.56 1.43  CREATININE  --   --  1.69*  --  1.78* 1.71*  TROPONINI 0.04*  --   --   --   --   --    < > = values in this interval not displayed.    Estimated Creatinine Clearance: 26 mL/min (A) (by C-G formula based on SCr of 1.71 mg/dL (H)).   Assessment: 72 y/o F on warfarin PTA for afib, also has tissue AVR. INR elevated yesterday at 3.53,warfarin dosing per outpatient anti-coag notes is 7.5 mg on Mondays and 5 mg all other days.   INR remains sub-therapeutic today.  H/H and plt remain stable  Goal of Therapy:  INR 2-3 Monitor platelets by anticoagulation protocol: Yes   Plan:  -Warfarin 7.5mg  x1 tonight -Daily PT/INR -Monitor for bleeding  Thank you for involving pharmacy in this patient's care.  Albertina Parr, PharmD., BCPS Clinical Pharmacist Clinical phone for 02/07/18 until 3:30pm: [phone] If after 3:30pm, please refer to [facility] for unit-specific pharmacist

## 2018-02-08 DIAGNOSIS — I5033 Acute on chronic diastolic (congestive) heart failure: Secondary | ICD-10-CM

## 2018-02-08 LAB — CBC
HEMATOCRIT: 35.6 % — AB (ref 36.0–46.0)
Hemoglobin: 10.3 g/dL — ABNORMAL LOW (ref 12.0–15.0)
MCH: 23.5 pg — ABNORMAL LOW (ref 26.0–34.0)
MCHC: 28.9 g/dL — ABNORMAL LOW (ref 30.0–36.0)
MCV: 81.1 fL (ref 78.0–100.0)
Platelets: 262 10*3/uL (ref 150–400)
RBC: 4.39 MIL/uL (ref 3.87–5.11)
RDW: 18.3 % — AB (ref 11.5–15.5)
WBC: 5.5 10*3/uL (ref 4.0–10.5)

## 2018-02-08 LAB — GLUCOSE, CAPILLARY
Glucose-Capillary: 126 mg/dL — ABNORMAL HIGH (ref 70–99)
Glucose-Capillary: 96 mg/dL (ref 70–99)

## 2018-02-08 LAB — PROTIME-INR
INR: 1.73
Prothrombin Time: 20.1 seconds — ABNORMAL HIGH (ref 11.4–15.2)

## 2018-02-08 LAB — BASIC METABOLIC PANEL
Anion gap: 7 (ref 5–15)
BUN: 26 mg/dL — ABNORMAL HIGH (ref 8–23)
CALCIUM: 9.5 mg/dL (ref 8.9–10.3)
CO2: 35 mmol/L — AB (ref 22–32)
Chloride: 95 mmol/L — ABNORMAL LOW (ref 98–111)
Creatinine, Ser: 1.69 mg/dL — ABNORMAL HIGH (ref 0.44–1.00)
GFR calc Af Amer: 33 mL/min — ABNORMAL LOW (ref >60)
GFR calc non Af Amer: 29 mL/min — ABNORMAL LOW (ref >60)
GLUCOSE: 166 mg/dL — AB (ref 70–99)
Potassium: 3.6 mmol/L (ref 3.5–5.1)
Sodium: 137 mmol/L (ref 135–145)

## 2018-02-08 LAB — MAGNESIUM: Magnesium: 2 mg/dL (ref 1.7–2.4)

## 2018-02-08 MED ORDER — LEVOTHYROXINE SODIUM 50 MCG PO TABS: 50.0000 ug | ORAL_TABLET | Freq: Every day | ORAL | 0 refills | Status: DC

## 2018-02-08 MED ORDER — METOPROLOL TARTRATE 25 MG PO TABS: 12.5000 mg | ORAL_TABLET | Freq: Two times a day (BID) | ORAL | 0 refills | Status: DC

## 2018-02-08 MED ORDER — TORSEMIDE 20 MG PO TABS: 20.0000 mg | ORAL_TABLET | Freq: Two times a day (BID) | ORAL | 0 refills | Status: DC

## 2018-02-08 MED ORDER — WARFARIN SODIUM 5 MG PO TABS: 5.0000 mg | ORAL_TABLET | Freq: Once | ORAL | Status: DC

## 2018-02-08 NOTE — Progress Notes (Signed)
ANTICOAGULATION CONSULT NOTE - Follow Up Consult  Pharmacy Consult for Warfarin  Indication: atrial fibrillation  Patient Measurements: Height: 5' (152.4 cm) Weight: 164 lb 10.9 oz (74.7 kg) IBW/kg (Calculated) : 45.5  Vital Signs: Temp: 97.8 F (36.6 C) (07/24 0805) Temp Source: Oral (07/24 0805) BP: 158/64 (07/24 0836) Pulse Rate: 63 (07/24 0836)  Labs: Recent Labs    02/06/18 0428 02/07/18 0628 02/08/18 0323  HGB 9.9* 10.0* 10.3*  HCT 33.6* 34.1* 35.6*  PLT 248 225 262  LABPROT 18.6* 17.3* 20.1*  INR 1.56 1.43 1.73  CREATININE 1.78* 1.71* 1.69*    Estimated Creatinine Clearance: 26.4 mL/min (A) (by C-G formula based on SCr of 1.69 mg/dL (H)).   Assessment: 72 y/o F on warfarin PTA for afib, also has tissue AVR. INR elevated on admission at 3.53, warfarin dosing per outpatient anti-coag notes is 7.5 mg on Mondays and 5 mg all other days.   INR remains sub-therapeutic today at 1.73, but has bumped up since yesterday. CBC stable, no s/sx of bleeding noted. Holding off on bridging due to high HAS-BLED score of 5.   Goal of Therapy:  INR 2-3  Monitor platelets by anticoagulation protocol: Yes   Plan:  -Resume PTA regimen Warfarin 5mg  x1 tonight -Daily PT/INR -Monitor for bleeding  Thank you for involving pharmacy in this patient's care.  Janae Bridgeman, PharmD PGY1 Pharmacy Resident Phone: [phone] 02/08/2018 10:57 AM

## 2018-02-08 NOTE — Progress Notes (Signed)
Physical Therapy Treatment Patient Details Name: [name] MRN: [id number] DOB: 1945/12/26 Today's Date: 02/08/2018    History of Present Illness Pt is a 72 y.o. female with PMH of ESRD s/p renal transplant, stage I adenocarcinoma of R lung s/p radiation, CHF, atrial fibrillation s/p maze procedure, AVR on warfarin, HTN, and insulin-dependent DM, presenting 02/03/18 with SOB and "squeezing" across her chest. Worked up for HF exacerbation.   PT Comments    Pt progressing well with mobility. Ambulating in room with intermittent UE support and performing ADLs independently. Hallway ambulation with rollator and supervision for balance, requiring 4x standing rest breaks secondary to fatigue. SpO2 >91% on RA throughout. Pt declining stair training. Will continue to follow acutely and recommend follow-up with HHPT services.    Follow Up Recommendations  Home health PT;Supervision - Intermittent     Equipment Recommendations  (requests electric scooter for community use)    Recommendations for Other Services       Precautions / Restrictions Precautions Precautions: Fall Restrictions Weight Bearing Restrictions: No    Mobility  Bed Mobility Overal bed mobility: Independent                Transfers Overall transfer level: Independent               General transfer comment: Entered room with pt standing performing ADLs. SpO2 noted to be 90% on RA  Ambulation/Gait Ambulation/Gait assistance: Supervision Gait Distance (Feet): 250 Feet Assistive device: None;4-wheeled walker Gait Pattern/deviations: Step-through pattern;Decreased stride length Gait velocity: Decreased Gait velocity interpretation: 1.31 - 2.62 ft/sec, indicative of limited community ambulator General Gait Details: Amb throughout room reaching to furniture for UE support; additional hallway ambulation with rollator, requiring 4x standing rest break secondary to fatigue. SpO2 >91% on RA  throughout   Stairs Stairs: Yes Stairs assistance: Supervision Stair Management: One rail Right;Forwards   General stair comments: Pt declining stair training, but agreeable to simulate steps by high marching with single UE support on hallway rail; supervision   Wheelchair Mobility    Modified Rankin (Stroke Patients Only)       Balance Overall balance assessment: Needs assistance Sitting-balance support: No upper extremity supported;Feet supported Sitting balance-Leahy Scale: Good     Standing balance support: No upper extremity supported;During functional activity Standing balance-Leahy Scale: Fair                              Cognition Arousal/Alertness: Awake/alert Behavior During Therapy: WFL for tasks assessed/performed Overall Cognitive Status: Within Functional Limits for tasks assessed                                        Exercises      General Comments        Pertinent Vitals/Pain Pain Assessment: No/denies pain    Home Living                      Prior Function            PT Goals (current goals can now be found in the care plan section) Acute Rehab PT Goals Patient Stated Goal: to go home and care for grandson PT Goal Formulation: With patient Time For Goal Achievement: 02/21/18 Potential to Achieve Goals: Good Progress towards PT goals: Progressing toward goals    Frequency  Min 3X/week      PT Plan Current plan remains appropriate    Co-evaluation              AM-PAC PT "6 Clicks" Daily Activity  Outcome Measure  Difficulty turning over in bed (including adjusting bedclothes, sheets and blankets)?: None Difficulty moving from lying on back to sitting on the side of the bed? : None Difficulty sitting down on and standing up from a chair with arms (e.g., wheelchair, bedside commode, etc,.)?: None Help needed moving to and from a bed to chair (including a wheelchair)?: None Help  needed walking in hospital room?: A Little Help needed climbing 3-5 steps with a railing? : A Lot 6 Click Score: 21    End of Session Equipment Utilized During Treatment: Gait belt;Oxygen Activity Tolerance: Patient tolerated treatment well Patient left: in bed;with call bell/phone within reach Nurse Communication: Mobility status PT Visit Diagnosis: Unsteadiness on feet (R26.81);Muscle weakness (generalized) (M62.81)     Time: 3524-8185 PT Time Calculation (min) (ACUTE ONLY): 30 min  Charges:  $Gait Training: 8-22 mins $Therapeutic Activity: 8-22 mins                    G Codes:      Mabeline Caras, PT, DPT Acute Rehab Services  Pager: [place] 02/08/2018, 9:57 AM

## 2018-02-08 NOTE — Care Management Note (Addendum)
Case Management Note [name] Gibbons RN, BSN Unit 4E-Case Manager [phone]  Patient Details  Name: [name] MRN: [id number] Date of Birth: 1946/07/04  Subjective/Objective:    Pt admitted with acute on chronic HF                Action/Plan: PTA pt lived at home, active with [facility]. Has rollator. Pt for d/c home today- orders placed for [facility] and DME-home 02- spoke with pt at bedside for [name] agency choice- per pt she would like to use [name] for [facility] needs. Pt is on RA today, per PT note was 91% on RA walking- discussed with pt that she does not qualify for home 02 under Medicare guidelines. No home 02 arranged- pt voices understanding.  Referral called to [name] with [name] for [name]- referral has been accepted.  Expected Discharge Date:  02/08/18               Expected Discharge Plan:  [place]  In-House Referral:     Discharge planning Services  CM Consult  Post Acute Care Choice:  Home Health, Durable Medical Equipment Choice offered to:  Patient  DME Arranged:  Oxygen DME Agency:  NA  HH Arranged:  RN, Disease Management [name]:  [name]  Status of Service:  Completed, signed off  If discussed at [place] of Stay Meetings, dates discussed:    Discharge Disposition: home/home health   Additional Comments:  [name] Patricia, RN 02/08/2018, 3:34 PM

## 2018-02-08 NOTE — Discharge Summary (Signed)
Physician Discharge Summary  Patient ID: BRESLYN ABDO MRN: [id number] DOB/AGE: 74-Jun-1947 74 y.o.  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   Chronic Persistent Atrial Fibrillation   Renal transplant recipient   S/P aortic valve replacement with bioprosthetic valve and maze procedure   Acute respiratory failure with hypoxia (HCC)   CKD (chronic kidney disease), stage III (HCC)   Hypertensive urgency   Adenocarcinoma of lung, stage 1, right (HCC)   Chest pain   Discharged Condition: stable  Hospital Course: Patient is a Nurse, adult medical history significant 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 Patient reports that she was taken off of Lasix approximately a month ago and told to use it as needed only. She developed shortness of breath approximately 3 days ago, began using Lasix daily since then, but has continued to progress. She reports dyspnea at rest tonight. She had "squeezing" across her chest yesterday evening, but no longer experiencing that. She denies fevers or chills. Denies abdominal pain or change in urination.  She was admitted for heart failure exacerbation.    Patient was aggressively diuresed during the hospital stay.  Shortly, patient was on IV Lasix 60 mg twice daily, but increased to 80 mg twice daily.  Patient has improved significantly.  Patient is eager to be discharged back on today.  Patient will be discharged on torsemide  20 mg p.o. twice daily.  The primary care provider and the cardiology team will kindly monitor patient's renal function, electrolytes and volume status, and adjust patient's diuretics accordingly.       Consults: cardiology   Discharge Exam: Blood pressure (!) 150/60, pulse 64, temperature 97.9 F (36.6 C), temperature source Oral, resp. rate (!) 23, height 5' (1.524 m), weight 74.7 kg (164 lb 10.9 oz), SpO2 96 %.   Disposition: Discharge disposition: 01-Home or Self Care    Discharge Instructions    Call MD for:   Complete by:  As directed    Please call MD if the symptoms worsen   Diet - low sodium heart healthy   Complete by:  As directed    Increase activity slowly   Complete by:  As directed      Allergies as of 02/08/2018      Reactions   Ibuprofen Nausea And Vomiting   Sulfamethoxazole-trimethoprim Itching, Swelling, Rash   Swelling of the face   Sulfonamide Derivatives Itching, Swelling, Rash   Swelling of the face   Tape Rash   Paper tape is ok   Tramadol Nausea And Vomiting   Doxycycline    nausea   Hydrocil [psyllium] Nausea And Vomiting   Bactrim Itching, Swelling, Rash   Red Dye Itching, Rash      Medication List    STOP taking these medications   diphenhydrAMINE 50 MG capsule Commonly known as:  BENADRYL   furosemide 20 MG tablet Commonly known as:  LASIX     TAKE these medications   amLODipine 10 MG tablet Commonly known as:  NORVASC Take 5 mg by mouth daily. TAKE 1/2 Tablet by mouth  daily   amoxicillin 500 MG capsule Commonly known as:  AMOXIL TAKE 4 CAPSULES BY MOUTH 1 HOUR PRIOR TO DENTAL APPOINTMENT   CENTRUM SILVER 50+WOMEN Tabs Take 1 tablet by mouth daily with breakfast.   dapsone 25 MG tablet Take 25 mg by mouth daily.   gabapentin 100 MG capsule Commonly known as:  NEURONTIN Take 100 mg by mouth 2 (two) times daily.   glucose blood test strip Commonly known as:  ONETOUCH VERIO USE AS INSTRUCTED 4 TIMES A DAY (BEFORE  MEALS) & AT BEDTIME (ICD 10-E11.9)   glucose blood test strip Commonly known as:  ONETOUCH VERIO 1 each by Other route 3 (three) times daily as needed for other. Dx E11.9   HUMALOG KWIKPEN 100 UNIT/ML KiwkPen Generic drug:  insulin lispro IF SUGAR:140-200 USE 8 UNITS,201-230 10 UNITS,231-260 12 UNITS,261-300 14 UNITS,>301 16 UNITS What changed:  See the new instructions.   Insulin Pen Needle 31G X 5 MM Misc Commonly known as:  B-D UF III MINI PEN NEEDLES USE TO ADMINISTER INSULIN FOUR TIMES A DAY DX E11.9   levothyroxine 50 MCG tablet Commonly known as:  SYNTHROID, LEVOTHROID Take 1 tablet (50 mcg total) by mouth daily before breakfast. Start taking on:  02/09/2018   lisinopril 5 MG tablet Commonly known as:  PRINIVIL,ZESTRIL Take 5 mg by mouth at bedtime.   metoprolol tartrate 25 MG tablet Commonly known as:  LOPRESSOR Take 0.5 tablets (12.5 mg total) by mouth 2 (two) times daily. What changed:    how much to take  how to take this  when to take this  additional instructions   mycophenolate 180 MG EC tablet Commonly known as:  MYFORTIC Take 360 mg by mouth 2 (two) times daily.   ONETOUCH DELICA LANCETS 57S Misc Use to check blood sugars three times a day DX E11.9   oxyCODONE-acetaminophen 5-325 MG tablet Commonly known as:  ROXICET Take 0.5-1 tablets by mouth every 6 (six) hours as needed for severe pain.   rosuvastatin 20 MG tablet Commonly known as:  CRESTOR Take 1 tablet (20 mg total) by mouth daily.   tacrolimus 1 MG capsule Commonly known as:  PROGRAF Take 3 mg by mouth 2 (two) times daily. Take 3 tablets by mouth twice daily   torsemide 20 MG tablet Commonly known as:  DEMADEX Take 1 tablet (20 mg total) by mouth 2 (two) times daily.   warfarin 5 MG tablet Commonly known as:  COUMADIN Take as directed. If you are unsure how to take this medication, talk to your nurse or doctor. Original instructions:  Take 5-7.5 mg by mouth See admin instructions.  7.5 mg on Monday, 5 mg all other days            Durable Medical Equipment  (From admission, onward)        Start     Ordered   02/08/18 1455  DME Oxygen  Once    Question Answer Comment  Mode or (Route) Nasal cannula   Liters per Minute 2   Oxygen delivery system Gas      02/08/18 1456     Time spent discharging the patient is 33 minutes.  SignedBonnell Public 02/08/2018, 2:56 PM

## 2018-02-08 NOTE — Progress Notes (Signed)
Patient sleeping with good wave form and Rm. air sats 73% Awaken patient and O2 sats came up to 91% rm air. Applied O2 at  [facility].  While sleeping

## 2018-02-08 NOTE — Progress Notes (Signed)
IV removed and intact. [facility] at bedside. Discharge instructions and education provided to pt. Pt has all belongings including phone. Pts husband at [place] tower to meet pt. Pt transported via wheelchair. Jerald Kief, RN

## 2018-02-09 ENCOUNTER — Encounter: Payer: Self-pay | Admitting: Radiation Oncology

## 2018-02-09 ENCOUNTER — Ambulatory Visit
Admission: RE | Admit: 2018-02-09 | Discharge: 2018-02-09 | Disposition: A | Discharge status: Home / Self Care | Payer: Medicare Other | Source: Ambulatory Visit | Attending: Radiation Oncology | Admitting: Radiation Oncology

## 2018-02-09 ENCOUNTER — Telehealth: Payer: Self-pay | Admitting: *Deleted

## 2018-02-09 ENCOUNTER — Other Ambulatory Visit: Payer: Self-pay

## 2018-02-09 VITALS — BP 151/67 | HR 51 | Temp 98.5°F | Resp 18 | Wt 163.6 lb

## 2018-02-09 DIAGNOSIS — Z888 Allergy status to other drugs, medicaments and biological substances status: Secondary | ICD-10-CM | POA: Diagnosis not present

## 2018-02-09 DIAGNOSIS — Z79899 Other long term (current) drug therapy: Secondary | ICD-10-CM | POA: Insufficient documentation

## 2018-02-09 DIAGNOSIS — C3491 Malignant neoplasm of unspecified part of right bronchus or lung: Secondary | ICD-10-CM | POA: Insufficient documentation

## 2018-02-09 DIAGNOSIS — Z886 Allergy status to analgesic agent status: Secondary | ICD-10-CM | POA: Insufficient documentation

## 2018-02-09 DIAGNOSIS — Z79890 Hormone replacement therapy (postmenopausal): Secondary | ICD-10-CM | POA: Diagnosis not present

## 2018-02-09 DIAGNOSIS — Z7901 Long term (current) use of anticoagulants: Secondary | ICD-10-CM | POA: Diagnosis not present

## 2018-02-09 DIAGNOSIS — Z794 Long term (current) use of insulin: Secondary | ICD-10-CM | POA: Diagnosis not present

## 2018-02-09 DIAGNOSIS — Z881 Allergy status to other antibiotic agents status: Secondary | ICD-10-CM | POA: Diagnosis not present

## 2018-02-09 DIAGNOSIS — Z882 Allergy status to sulfonamides status: Secondary | ICD-10-CM | POA: Diagnosis not present

## 2018-02-09 LAB — URINE CULTURE

## 2018-02-09 NOTE — Telephone Encounter (Signed)
Transition Care Management Follow-up Telephone Call   Date discharged? 02/08/18   How have you been since you were released from the hospital? Pt states she is doing ok   Do you understand why you were in the hospital? YES   Do you understand the discharge instructions? YES   Where were you discharged to? Home   Items Reviewed:  Medications reviewed: YES  Allergies reviewed: YES  Dietary changes reviewed: YES, heart and diabetic diet  Referrals reviewed: No   Functional Questionnaire:   Activities of Daily Living (ADLs):   She states she are independent in the following: bathing and hygiene, feeding, continence, grooming, toileting and dressing States they require assistance with the following: ambulation   Any transportation issues/concerns?: NO   Any patient concerns? NO   Confirmed importance and date/time of follow-up visits scheduled YES, appt 02/20/18  Provider Appointment booked with Dr. Alain Marion  Confirmed with patient if condition begins to worsen call PCP or go to the ER.  Patient was given the office number and encouraged to call back with question or concerns.  : YES

## 2018-02-09 NOTE — Progress Notes (Signed)
Radiation Oncology         [phone] ________________________________  Name: [name] MRN: [id number]  Date: 02/09/2018  DOB: 06-01-1946  Follow-Up Visit Note  CC: Plotnikov, [name] Lacks, MD  Rexene Alberts, MD    ICD-10-CM   1. Adenocarcinoma of lung, stage 1, right (HCC) C34.91     Diagnosis:   72 y.o.female with Clinical Stage I NSCLC (well-differentiated adenocarcinoma) The encounter diagnosis was Adenocarcinoma of lung, stage 1, right ([place]).  Interval Since Last Radiation:  1 month  Radiation treatment dates:   12/22/2017, 12/26/2017, 12/29/2017  Site/dose:   Right lung, 18 Gy of 3 fractions for a total dose of 54 Gy  Narrative:  The patient returns today for routine follow-up.  She was recently in the ED for CHF due to excess fluid and SOB and was admitted for this issue. She continues to have a cough and adds getting short of breath with activity and at rest. She endorses mild fatigue. Her appetite is okay. Patient denies any pain, swallowing issues and any issues with her skin.                               ALLERGIES:  is allergic to ibuprofen; sulfamethoxazole-trimethoprim; sulfonamide derivatives; tape; tramadol; doxycycline; hydrocil [psyllium]; bactrim; and red dye.  Meds: Current Outpatient Medications  Medication Sig Dispense Refill  . amLODipine (NORVASC) 10 MG tablet Take 5 mg by mouth daily. TAKE 1/2 Tablet by mouth daily     . amoxicillin (AMOXIL) 500 MG capsule TAKE 4 CAPSULES BY MOUTH 1 HOUR PRIOR TO DENTAL APPOINTMENT 12 capsule 1  . dapsone 25 MG tablet Take 25 mg by mouth daily.     Marland Kitchen gabapentin (NEURONTIN) 100 MG capsule Take 100 mg by mouth 2 (two) times daily.     Marland Kitchen glucose blood (ONETOUCH VERIO) test strip USE AS INSTRUCTED 4 TIMES A DAY (BEFORE MEALS) & AT BEDTIME (ICD 10-E11.9) 150 each 3  . glucose blood (ONETOUCH VERIO) test strip 1 each by Other route 3 (three) times daily as needed for other. Dx E11.9 150 each 5  . HUMALOG KWIKPEN 100  UNIT/ML KiwkPen IF SUGAR:140-200 USE 8 UNITS,201-230 10 UNITS,231-260 12 UNITS,261-300 14 UNITS,>301 16 UNITS (Patient taking differently: IF SUGAR:140-200 USE 8 UNITS,201-230 10 UNITS,231-260 12 UNITS,261-300 14 UNITS,>301 16 UNITS AS NEEDED FOR HIGH BLOD SUGAR) 27 pen 3  . Insulin Pen Needle (B-D UF III MINI PEN NEEDLES) 31G X 5 MM MISC USE TO ADMINISTER INSULIN FOUR TIMES A DAY DX E11.9 100 each 1  . levothyroxine (SYNTHROID, LEVOTHROID) 50 MCG tablet Take 1 tablet (50 mcg total) by mouth daily before breakfast. 30 tablet 0  . lisinopril (PRINIVIL,ZESTRIL) 5 MG tablet Take 5 mg by mouth at bedtime.     . metoprolol tartrate (LOPRESSOR) 25 MG tablet Take 0.5 tablets (12.5 mg total) by mouth 2 (two) times daily. 30 tablet 0  . Multiple Vitamins-Minerals (CENTRUM SILVER 50+WOMEN) TABS Take 1 tablet by mouth daily with breakfast.    . mycophenolate (MYFORTIC) 180 MG EC tablet Take 360 mg by mouth 2 (two) times daily.     [name] DELICA LANCETS 72Z MISC Use to check blood sugars three times a day DX E11.9 100 each 5  . rosuvastatin (CRESTOR) 20 MG tablet Take 1 tablet (20 mg total) by mouth daily. 30 tablet 11  . tacrolimus (PROGRAF) 1 MG capsule Take 3 mg by mouth 2 (  two) times daily. Take 3 tablets by mouth twice daily     . torsemide (DEMADEX) 20 MG tablet Take 1 tablet (20 mg total) by mouth 2 (two) times daily. 60 tablet 0  . warfarin (COUMADIN) 5 MG tablet Take 5-7.5 mg by mouth See admin instructions. 7.5 mg on Monday, 5 mg all other days    . oxyCODONE-acetaminophen (ROXICET) 5-325 MG tablet Take 0.5-1 tablets by mouth every 6 (six) hours as needed for severe pain. (Patient not taking: Reported on 02/09/2018) 100 tablet 0   No current facility-administered medications for this encounter.     Physical Findings: The patient is in no acute distress. Patient is alert and oriented.  weight is 163 lb 9.6 oz (74.2 kg). Her oral temperature is 98.5 F (36.9 C). Her blood pressure is 151/67  (abnormal) and her pulse is 51 (abnormal). Her respiration is 18 and oxygen saturation is 99%. .  No significant changes.  Lungs are clear to auscultation bilaterally. Heart has regular rate and rhythm. No palpable cervical, supraclavicular, or axillary adenopathy. Abdomen soft, non-tender, normal bowel sounds.   Lab Findings: Lab Results  Component Value Date   WBC 5.5 02/08/2018   HGB 10.3 (L) 02/08/2018   HCT 35.6 (L) 02/08/2018   MCV 81.1 02/08/2018   PLT 262 02/08/2018    Radiographic Findings: Dg Chest 2 View  Result Date: 02/06/2018 CLINICAL DATA:  Shortness of breath and mid chest pain-tightness developed 3 days ago. Also cough and chest congestion. History of lung malignancy, coronary artery disease and CHF. Previous coronary stent placement and aortic valve replacement. EXAM: CHEST - 2 VIEW COMPARISON:  Portable chest x-ray of February 03, 2018 FINDINGS: The lungs are well-expanded. The interstitial markings are markedly improved. There is hazy increased density in the right apex little changed from the study of 3 days ago but more conspicuous than on the study of Nov 25, 2017. The cardiac silhouette remains enlarged. The pulmonary vascularity is less engorged and more distinct. The sternal wires are intact. The left atrial appendage clip and the prosthetic aortic valve ring appear to be in stable position. There is calcification in the wall of the aortic arch. The observed bony thorax is unremarkable. IMPRESSION: Improved appearance of the thorax with decreased pulmonary edema. Stable cardiomegaly. Hazy increased density in the right pulmonary apex unchanged from the study of 3 days ago likely reflects a known pulmonary nodule which may have increased in size. Electronically Signed   By: David  Martinique M.D.   On: 02/06/2018 10:14   Dg Chest Portable 1 View  Result Date: 02/04/2018 CLINICAL DATA:  Initial evaluation for acute shortness of breath, heart palpitations. EXAM: PORTABLE CHEST 1  VIEW COMPARISON:  Prior radiograph from 11/25/2017. FINDINGS: Median sternotomy wires with underlying valvular prosthesis and left atrial appendage clip. Moderate cardiomegaly, stable from previous. Mediastinal silhouette within normal limits. Lungs hypoinflated. Moderate diffuse pulmonary interstitial edema. Suspected small bilateral pleural effusions. No consolidative airspace disease. No pneumothorax. No acute osseous abnormality. IMPRESSION: 1. Cardiomegaly with moderate diffuse pulmonary interstitial edema. 2. Suspected small bilateral pleural effusions. Electronically Signed   By: Jeannine Boga M.D.   On: 02/04/2018 00:14    Impression:  Clinically stable. Patient was recently admitted for exaberbation of CHF. Ardyce is doing well since her diuresis. Tolerated SBRT well.  Plan:  Schedule for CT scan of chest in November 2019. Follow-up in Radiation Oncology around the same time as follow-up with Dr. Julien Nordmann.     ____________________________________  Jeneen Rinks  Jabier Gauss, PhD, MD  This document serves as a record of services personally performed by Blair Promise, PhD, MD. It was created on his behalf by Margit Banda, a trained medical scribe. The creation of this record is based on the scribe's personal observations and the provider's statements to them. This document has been checked and approved by the attending provider.

## 2018-02-09 NOTE — Progress Notes (Signed)
Ms. Lasky is here for her follow-up appointment today.Patient denies any pain. States  That she has mild fatigue. States that she gets shortness of breath with activity and at rest. Denies any issues with swallowing. States that her appetite is ok. Denies any issues with her skin. States that she got out of the hospital yesterday for excess fluid and shortness of breath.Patients blood pressure was elevated denies any sign or sytoms of hypertension Vitals:   02/09/18 1128  BP: (!) 151/67  Pulse: (!) 51  Resp: 18  Temp: 98.5 F (36.9 C)  TempSrc: Oral  SpO2: 99%  Weight: 163 lb 9.6 oz (74.2 kg)   Wt Readings from Last 3 Encounters:  02/09/18 163 lb 9.6 oz (74.2 kg)  02/08/18 164 lb 10.9 oz (74.7 kg)  12/08/17 165 lb 4.8 oz (75 kg)

## 2018-02-11 LAB — CULTURE, BLOOD (ROUTINE X 2)
CULTURE: NO GROWTH
Culture: NO GROWTH

## 2018-02-13 ENCOUNTER — Telehealth: Payer: Self-pay | Admitting: Internal Medicine

## 2018-02-13 DIAGNOSIS — N183 Chronic kidney disease, stage 3 (moderate): Secondary | ICD-10-CM | POA: Diagnosis not present

## 2018-02-13 DIAGNOSIS — E1122 Type 2 diabetes mellitus with diabetic chronic kidney disease: Secondary | ICD-10-CM | POA: Diagnosis not present

## 2018-02-13 DIAGNOSIS — I5033 Acute on chronic diastolic (congestive) heart failure: Secondary | ICD-10-CM | POA: Diagnosis not present

## 2018-02-13 DIAGNOSIS — M109 Gout, unspecified: Secondary | ICD-10-CM | POA: Diagnosis not present

## 2018-02-13 DIAGNOSIS — D631 Anemia in chronic kidney disease: Secondary | ICD-10-CM | POA: Diagnosis not present

## 2018-02-13 DIAGNOSIS — Z7901 Long term (current) use of anticoagulants: Secondary | ICD-10-CM | POA: Diagnosis not present

## 2018-02-13 DIAGNOSIS — Z79891 Long term (current) use of opiate analgesic: Secondary | ICD-10-CM | POA: Diagnosis not present

## 2018-02-13 DIAGNOSIS — C3491 Malignant neoplasm of unspecified part of right bronchus or lung: Secondary | ICD-10-CM | POA: Diagnosis not present

## 2018-02-13 DIAGNOSIS — G4733 Obstructive sleep apnea (adult) (pediatric): Secondary | ICD-10-CM | POA: Diagnosis not present

## 2018-02-13 DIAGNOSIS — E785 Hyperlipidemia, unspecified: Secondary | ICD-10-CM | POA: Diagnosis not present

## 2018-02-13 DIAGNOSIS — I69320 Aphasia following cerebral infarction: Secondary | ICD-10-CM | POA: Diagnosis not present

## 2018-02-13 DIAGNOSIS — K5790 Diverticulosis of intestine, part unspecified, without perforation or abscess without bleeding: Secondary | ICD-10-CM | POA: Diagnosis not present

## 2018-02-13 DIAGNOSIS — I4891 Unspecified atrial fibrillation: Secondary | ICD-10-CM | POA: Diagnosis not present

## 2018-02-13 DIAGNOSIS — Z905 Acquired absence of kidney: Secondary | ICD-10-CM | POA: Diagnosis not present

## 2018-02-13 DIAGNOSIS — K219 Gastro-esophageal reflux disease without esophagitis: Secondary | ICD-10-CM | POA: Diagnosis not present

## 2018-02-13 DIAGNOSIS — Z794 Long term (current) use of insulin: Secondary | ICD-10-CM | POA: Diagnosis not present

## 2018-02-13 DIAGNOSIS — I130 Hypertensive heart and chronic kidney disease with heart failure and stage 1 through stage 4 chronic kidney disease, or unspecified chronic kidney disease: Secondary | ICD-10-CM | POA: Diagnosis not present

## 2018-02-13 DIAGNOSIS — I2510 Atherosclerotic heart disease of native coronary artery without angina pectoris: Secondary | ICD-10-CM | POA: Diagnosis not present

## 2018-02-13 DIAGNOSIS — Z952 Presence of prosthetic heart valve: Secondary | ICD-10-CM | POA: Diagnosis not present

## 2018-02-13 NOTE — Telephone Encounter (Signed)
Copied from [place] [id number]. Topic: Quick Communication - See Telephone Encounter >> Feb 13, 2018  3:11 PM Vernona Rieger wrote: CRM for notification. See Telephone encounter for: 02/13/18.  Glenard Haring, RN with [facility] and would like orders to see the patient for skilled nursing & tele monitoring for 9 weeks  [phone]

## 2018-02-14 NOTE — Telephone Encounter (Signed)
LM giving verbals, FYI 

## 2018-02-15 DIAGNOSIS — I130 Hypertensive heart and chronic kidney disease with heart failure and stage 1 through stage 4 chronic kidney disease, or unspecified chronic kidney disease: Secondary | ICD-10-CM | POA: Diagnosis not present

## 2018-02-15 DIAGNOSIS — N183 Chronic kidney disease, stage 3 (moderate): Secondary | ICD-10-CM | POA: Diagnosis not present

## 2018-02-15 DIAGNOSIS — C3491 Malignant neoplasm of unspecified part of right bronchus or lung: Secondary | ICD-10-CM | POA: Diagnosis not present

## 2018-02-15 DIAGNOSIS — I5033 Acute on chronic diastolic (congestive) heart failure: Secondary | ICD-10-CM | POA: Diagnosis not present

## 2018-02-15 DIAGNOSIS — I2510 Atherosclerotic heart disease of native coronary artery without angina pectoris: Secondary | ICD-10-CM | POA: Diagnosis not present

## 2018-02-15 DIAGNOSIS — E1122 Type 2 diabetes mellitus with diabetic chronic kidney disease: Secondary | ICD-10-CM | POA: Diagnosis not present

## 2018-02-15 NOTE — Telephone Encounter (Signed)
Orders faxed

## 2018-02-15 NOTE — Telephone Encounter (Signed)
[name] Pimple, RN called and said that she has been using a oxygen concentrator that her daughter brought her. She is 91% with 2 liters per [name]. She is just checking to see if the home health orders are going to be approved by Dr Alain Marion and can someone call her? Call back @ [phone]

## 2018-02-16 ENCOUNTER — Other Ambulatory Visit: Payer: Self-pay | Admitting: *Deleted

## 2018-02-16 ENCOUNTER — Encounter: Payer: Self-pay | Admitting: *Deleted

## 2018-02-16 ENCOUNTER — Telehealth: Payer: Self-pay | Admitting: Internal Medicine

## 2018-02-16 NOTE — Telephone Encounter (Signed)
Ok Thx 

## 2018-02-16 NOTE — Telephone Encounter (Unsigned)
Copied from [place] [id number]. Topic: Quick Communication - See Telephone Encounter >> Feb 16, 2018  4:02 PM Neva Seat wrote: [facility] care manager - [id number] [phone]  No longer provide telemontoring Can provide equipment of scales - BP cuff and Pulse ox Please call Shaunda to let her know if Dr. Mamie Nick wants them to send the equipment or not.

## 2018-02-16 NOTE — Patient Outreach (Signed)
[facility]) Care Management  02/16/2018  [name] 1945/10/21 [id number]   RN Health Coach Monthly Outreach/Post Hospitalization follow up  Referral Date:01/12/2017 Referral Source:[facility] RN Case Manager Reason for Referral:CHF education Insurance:Medicare   Outreach Attempt:  Successful telephone outreach to patient for monthly follow up and post hospitalization follow up.  HIPAA verified with patient.  Patient reporting an increase in shortness of breath since discharge from the hospital for CHF exacerbation related to inability to get lasix refilled weeks prior.  States shortness of breath started Monday night and got worse on Tuesday morning.  Reports when her daughter came over Tuesday she was really short of breath so her daughter provided her with her home oxygen (daughter's oxygen).  Patient is wearing daughter's home oxygen at 2 l/m and reports shortness of breath is better.  Patient audibile short of breath on telephone but states she is just coming down the stairs for the first time in days.  As conversation continues shortness of breath is less evident.  Per chart review orders for patient to receive home oxygen is observed.  RN Health Coach outreach to [employer] with [place] to verify when patient's home oxygen would be delivered.  Glenard Haring RN to check with the Respiratory Team at [facility].  Glenard Haring also reporting patient had not picked up her medications post discharge when she seen patient on Monday.  Another outreach to patient to verify compliance with medications.  HIPAA verified with patient.  After conversation and question, patient admits her husband just picked up medications yesterday.  States there was confusion where the medications were sent to.  Patient stating she now has medications and is taking as prescribed.  Discussed with patient the increase shortness of breath is probably related to fluid overload and the need for her to contact primary  care provider or her cardiologist for guidance.  Patient stating her fluid medication did not need to be increased at this time, as she does not want to do more harm to her kidneys.  Encouraged patient to contact physicians for any increase shortness of breath.  Denies any lower extremity edema.  Discussed with patient [name] for home visits; patient declines home visits at this time and states she does not feel like talking to a lot of people.  Appointments:  Patient has post hospitalization appointment scheduled with Dr. Alain Marion, primary care provider on 02/20/2018.  Encouraged patient to schedule follow up appointment with her Cardiologist.  Receiving home health with [place] and LPN-[name] is scheduled to see on 02/17/2018.  Plan:  RN Health Coach will make follow up telephone outreach to patient with in the next 10 days (patient agreeable to follow up telephone call).   [place] [phone] [name]

## 2018-02-17 ENCOUNTER — Telehealth: Payer: Self-pay | Admitting: Internal Medicine

## 2018-02-17 NOTE — Telephone Encounter (Signed)
Copied from [place] [id number]. Topic: General - Other >> Feb 17, 2018  2:26 PM Lennox Solders wrote: Reason for BJS:EGBTD from [facility] is calling the patient does not qualify for oxygen. The patient would need office visit to address the oxygen need also saturation walk test . Please call jason [phone]

## 2018-02-18 DIAGNOSIS — C3491 Malignant neoplasm of unspecified part of right bronchus or lung: Secondary | ICD-10-CM | POA: Diagnosis not present

## 2018-02-18 DIAGNOSIS — I130 Hypertensive heart and chronic kidney disease with heart failure and stage 1 through stage 4 chronic kidney disease, or unspecified chronic kidney disease: Secondary | ICD-10-CM | POA: Diagnosis not present

## 2018-02-18 DIAGNOSIS — I5033 Acute on chronic diastolic (congestive) heart failure: Secondary | ICD-10-CM | POA: Diagnosis not present

## 2018-02-18 DIAGNOSIS — E1122 Type 2 diabetes mellitus with diabetic chronic kidney disease: Secondary | ICD-10-CM | POA: Diagnosis not present

## 2018-02-18 DIAGNOSIS — N183 Chronic kidney disease, stage 3 (moderate): Secondary | ICD-10-CM | POA: Diagnosis not present

## 2018-02-18 DIAGNOSIS — I2510 Atherosclerotic heart disease of native coronary artery without angina pectoris: Secondary | ICD-10-CM | POA: Diagnosis not present

## 2018-02-20 ENCOUNTER — Ambulatory Visit (INDEPENDENT_AMBULATORY_CARE_PROVIDER_SITE_OTHER): Payer: Medicare Other | Admitting: Internal Medicine

## 2018-02-20 ENCOUNTER — Encounter: Payer: Self-pay | Admitting: Internal Medicine

## 2018-02-20 DIAGNOSIS — C3411 Malignant neoplasm of upper lobe, right bronchus or lung: Secondary | ICD-10-CM | POA: Diagnosis not present

## 2018-02-20 DIAGNOSIS — J9611 Chronic respiratory failure with hypoxia: Secondary | ICD-10-CM | POA: Diagnosis not present

## 2018-02-20 DIAGNOSIS — I2510 Atherosclerotic heart disease of native coronary artery without angina pectoris: Secondary | ICD-10-CM

## 2018-02-20 DIAGNOSIS — I481 Persistent atrial fibrillation: Secondary | ICD-10-CM

## 2018-02-20 DIAGNOSIS — I5022 Chronic systolic (congestive) heart failure: Secondary | ICD-10-CM | POA: Diagnosis not present

## 2018-02-20 DIAGNOSIS — C3491 Malignant neoplasm of unspecified part of right bronchus or lung: Secondary | ICD-10-CM | POA: Diagnosis not present

## 2018-02-20 DIAGNOSIS — I4819 Other persistent atrial fibrillation: Secondary | ICD-10-CM

## 2018-02-20 NOTE — Assessment & Plan Note (Signed)
Oxygen

## 2018-02-20 NOTE — Assessment & Plan Note (Signed)
2019 XRT Dr Julien Nordmann Dr Melvyn Novas Dr Sondra Come

## 2018-02-20 NOTE — Assessment & Plan Note (Signed)
  Amlodipine, Pravastatin

## 2018-02-20 NOTE — Assessment & Plan Note (Signed)
Coumadin 

## 2018-02-20 NOTE — Progress Notes (Signed)
Subjective:  [name], female    DOB: 1946/05/09  Age: 72 y.o. MRN: [id number]  CC: No chief complaint on file.   HPI [name] presents for a post-hosp f/u for CHF and SOB (d/c'd on 7/24). 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   Chronic Persistent Atrial Fibrillation   Renal transplant recipient   S/P aortic valve replacement with bioprosthetic valve and maze procedure   Acute respiratory failure with hypoxia (HCC)   CKD (chronic kidney disease), stage III (HCC)   Hypertensive urgency   Adenocarcinoma of lung, stage 1, right (HCC)   Chest pain   Discharged Condition: stable  Hospital Course: Patient is a Nurse, adult medical history significant 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 Patient reports that she was taken off of Lasix approximately a month ago and told to use it as needed only. She developed shortness of breath approximately 3 days ago, began using Lasix daily since then, but has continued to progress. She reports dyspnea at rest tonight. She had "squeezing" across her chest yesterday evening, but no longer experiencing that. She denies fevers or chills. Denies abdominal pain or change in urination.  She was admitted for heart failure exacerbation.   Patient was aggressively diuresed during the hospital stay.  Shortly, patient was on IV Lasix 60 mg twice daily, but increased to 80 mg twice daily.  Patient has improved significantly.  Patient  is eager to be discharged back on today.  Patient will be discharged on torsemide 20 mg p.o. twice daily.  The primary care provider and the cardiology team will kindly monitor patient's renal function, electrolytes and volume status, and adjust patient's diuretics accordingly.   "      Outpatient Medications Prior to Visit  Medication Sig Dispense Refill  . amLODipine (NORVASC) 10 MG tablet Take 5 mg by mouth daily. TAKE 1/2 Tablet by mouth daily     . amoxicillin (AMOXIL) 500 MG capsule TAKE 4 CAPSULES BY MOUTH 1 HOUR PRIOR TO DENTAL APPOINTMENT 12 capsule 1  . dapsone 25 MG tablet Take 25 mg by mouth daily.     Marland Kitchen gabapentin (NEURONTIN) 100 MG capsule Take 100 mg by mouth 2 (two) times daily.     Marland Kitchen glucose blood (ONETOUCH VERIO) test strip USE AS INSTRUCTED 4 TIMES A DAY (BEFORE MEALS) & AT BEDTIME (ICD 10-E11.9) 150 each 3  . glucose blood (ONETOUCH VERIO) test strip 1 each by Other route 3 (three) times daily as needed for other. Dx E11.9 150 each 5  . HUMALOG KWIKPEN 100 UNIT/ML KiwkPen IF SUGAR:140-200 USE 8 UNITS,201-230 10 UNITS,231-260 12 UNITS,261-300 14 UNITS,>301 16 UNITS (Patient taking differently: IF SUGAR:140-200 USE 8 UNITS,201-230 10 UNITS,231-260 12 UNITS,261-300 14 UNITS,>301 16 UNITS AS NEEDED FOR HIGH BLOD SUGAR) 27 pen 3  . Insulin Pen Needle (B-D UF III MINI PEN NEEDLES) 31G X 5 MM MISC USE TO ADMINISTER INSULIN FOUR TIMES A DAY DX E11.9 100 each 1  . levothyroxine (SYNTHROID, LEVOTHROID) 50 MCG tablet Take 1 tablet (50 mcg total)  by mouth daily before breakfast. 30 tablet 0  . lisinopril (PRINIVIL,ZESTRIL) 5 MG tablet Take 5 mg by mouth at bedtime.     . metoprolol tartrate (LOPRESSOR) 25 MG tablet Take 0.5 tablets (12.5 mg total) by mouth 2 (two) times daily. 30 tablet 0  . Multiple Vitamins-Minerals (CENTRUM SILVER 50+WOMEN) TABS Take 1 tablet by mouth daily with breakfast.    . mycophenolate (MYFORTIC) 180 MG EC tablet Take 360 mg by mouth 2 (two) times daily.     [name] DELICA LANCETS 66Z MISC Use to check blood sugars three times a day DX E11.9 100 each 5  . oxyCODONE-acetaminophen (ROXICET) 5-325 MG tablet Take 0.5-1 tablets by mouth every 6 (six) hours as needed for severe pain. 100 tablet 0  . rosuvastatin (CRESTOR) 20 MG tablet Take 1 tablet (20 mg total) by mouth daily. 30 tablet 11  . tacrolimus (PROGRAF) 1 MG capsule Take 3 mg by mouth 2 (two) times daily. Take 3 tablets by mouth twice daily     . torsemide (DEMADEX) 20 MG tablet Take 1 tablet (20 mg total) by mouth 2 (two) times daily. 60 tablet 0  . warfarin (COUMADIN) 5 MG tablet Take 5-7.5 mg by mouth See admin instructions. 7.5 mg on Monday, 5 mg all other days     No facility-administered medications prior to visit.     ROS: Review of Systems  Constitutional: Positive for fatigue. Negative for activity change, appetite change, chills and unexpected weight change.  HENT: Negative for congestion, mouth sores and sinus pressure.   Eyes: Negative for visual disturbance.  Respiratory: Positive for cough and shortness of breath. Negative for chest tightness.   Cardiovascular: Positive for leg swelling.  Gastrointestinal: Negative for abdominal pain and nausea.  Genitourinary: Negative for difficulty urinating, frequency and vaginal pain.  Musculoskeletal: Positive for back pain, gait problem and myalgias.  Skin: Negative for pallor and rash.  Neurological: Negative for dizziness, tremors, weakness, numbness and headaches.  Psychiatric/Behavioral: Positive for decreased concentration. Negative for confusion, sleep disturbance and suicidal ideas. The patient is nervous/anxious.     Objective:  BP 132/78 (BP Location: Left Arm, Patient Position: Sitting, Cuff Size: Normal)   Pulse 76   Temp 98.3 F (36.8 C) (Oral)   Ht 5' (1.524 m)   Wt 159 lb (72.1 kg)   SpO2 93%   BMI 31.05 kg/m   BP Readings from Last 3 Encounters:  02/20/18 132/78  02/09/18 (!) 151/67  02/08/18 (!) 150/60     Wt Readings from Last 3 Encounters:  02/20/18 159 lb (72.1 kg)  02/09/18 163 lb 9.6 oz (74.2 kg)  02/08/18 164 lb 10.9 oz (74.7 kg)    Physical Exam  Constitutional: She appears well-developed. No distress.  HENT:  Head: Normocephalic.  Right Ear: External ear normal.  Left Ear: External ear normal.  Nose: Nose normal.  Mouth/Throat: Oropharynx is clear and moist.  Eyes: Pupils are equal, round, and reactive to light. Conjunctivae are normal. Right eye exhibits no discharge. Left eye exhibits no discharge.  Neck: Normal range of motion. Neck supple. No JVD present. No tracheal deviation present. No thyromegaly present.  Cardiovascular: Normal rate, regular rhythm and normal heart sounds.  Pulmonary/Chest: No stridor. No respiratory distress. She has no wheezes.  Abdominal: Soft. Bowel sounds are normal. She exhibits no distension and no mass. There is no tenderness. There is no rebound and no guarding.  Musculoskeletal: She exhibits tenderness. She exhibits no edema.  Lymphadenopathy:  She has no cervical adenopathy.  Neurological: She displays normal reflexes. No cranial nerve deficit. She exhibits normal muscle tone. Coordination abnormal.  Skin: No rash noted. No erythema.  Psychiatric: She has a normal mood and affect. Her behavior is normal. Judgment and thought content normal.  in a w/c  Lab Results  Component Value Date   WBC 5.5 02/08/2018   HGB 10.3 (L) 02/08/2018   HCT 35.6 (L) 02/08/2018   PLT 262 02/08/2018   GLUCOSE 166 (H) 02/08/2018   CHOL 174 11/18/2016   TRIG 131.0 11/18/2016   HDL 75.10 11/18/2016   LDLDIRECT 89.3 06/13/2006   LDLCALC 72 11/18/2016   ALT 16 12/08/2017   AST 21 12/08/2017   NA 137 02/08/2018   K 3.6 02/08/2018   CL 95 (L) 02/08/2018   CREATININE 1.69 (H) 02/08/2018   BUN 26 (H) 02/08/2018   CO2 35 (H) 02/08/2018   TSH 2.93 09/02/2017   INR 1.73 02/08/2018   HGBA1C 8.7 (H) 02/04/2018   MICROALBUR 317.3 (H) 07/02/2016    No  results found.  Assessment & Plan:   There are no diagnoses linked to this encounter.   No orders of the defined types were placed in this encounter.    Follow-up: No follow-ups on file.  Walker Kehr, MD

## 2018-02-20 NOTE — Assessment & Plan Note (Addendum)
Last CXR 7.22.19 XRT

## 2018-02-20 NOTE — Telephone Encounter (Signed)
LM notifying Corene Cornea that new orders are going to be faxed because 6 min walk test was done

## 2018-02-20 NOTE — Assessment & Plan Note (Addendum)
O2 O2 sat 85% RA after a 6 min walk

## 2018-02-21 ENCOUNTER — Telehealth: Payer: Self-pay | Admitting: Cardiology

## 2018-02-21 DIAGNOSIS — I2510 Atherosclerotic heart disease of native coronary artery without angina pectoris: Secondary | ICD-10-CM | POA: Diagnosis not present

## 2018-02-21 DIAGNOSIS — C3491 Malignant neoplasm of unspecified part of right bronchus or lung: Secondary | ICD-10-CM | POA: Diagnosis not present

## 2018-02-21 DIAGNOSIS — I5033 Acute on chronic diastolic (congestive) heart failure: Secondary | ICD-10-CM | POA: Diagnosis not present

## 2018-02-21 DIAGNOSIS — N183 Chronic kidney disease, stage 3 (moderate): Secondary | ICD-10-CM | POA: Diagnosis not present

## 2018-02-21 DIAGNOSIS — I130 Hypertensive heart and chronic kidney disease with heart failure and stage 1 through stage 4 chronic kidney disease, or unspecified chronic kidney disease: Secondary | ICD-10-CM | POA: Diagnosis not present

## 2018-02-21 DIAGNOSIS — E1122 Type 2 diabetes mellitus with diabetic chronic kidney disease: Secondary | ICD-10-CM | POA: Diagnosis not present

## 2018-02-21 NOTE — Telephone Encounter (Signed)
Order to draw INR on Th given to [employer].  Will call coumadin clinic with results.

## 2018-02-21 NOTE — Telephone Encounter (Signed)
New Message         [facility] with [facility] is asking if would like for her to draw the PT-INR? They are doing home visits and will draw the lab if it's ok, pls advise.

## 2018-02-21 NOTE — Progress Notes (Signed)
SATURATION QUALIFICATIONS: (This note is used to comply with regulatory documentation for home oxygen)  Patient Saturations on Room Air at Rest = 94%  Patient Saturations on Room Air while Ambulating = 85%  Patient Saturations on 3 Liters of oxygen while Ambulating = 95%  Please briefly explain why patient needs home oxygen: patient needs oxygen for CHF, SOB, an lung cancer

## 2018-02-23 ENCOUNTER — Ambulatory Visit (INDEPENDENT_AMBULATORY_CARE_PROVIDER_SITE_OTHER): Payer: Medicare Other | Admitting: Pharmacist

## 2018-02-23 ENCOUNTER — Telehealth: Payer: Self-pay

## 2018-02-23 DIAGNOSIS — C3491 Malignant neoplasm of unspecified part of right bronchus or lung: Secondary | ICD-10-CM | POA: Diagnosis not present

## 2018-02-23 DIAGNOSIS — I5033 Acute on chronic diastolic (congestive) heart failure: Secondary | ICD-10-CM | POA: Diagnosis not present

## 2018-02-23 DIAGNOSIS — Z5181 Encounter for therapeutic drug level monitoring: Secondary | ICD-10-CM | POA: Diagnosis not present

## 2018-02-23 DIAGNOSIS — I4819 Other persistent atrial fibrillation: Secondary | ICD-10-CM

## 2018-02-23 DIAGNOSIS — I130 Hypertensive heart and chronic kidney disease with heart failure and stage 1 through stage 4 chronic kidney disease, or unspecified chronic kidney disease: Secondary | ICD-10-CM | POA: Diagnosis not present

## 2018-02-23 DIAGNOSIS — I2510 Atherosclerotic heart disease of native coronary artery without angina pectoris: Secondary | ICD-10-CM | POA: Diagnosis not present

## 2018-02-23 DIAGNOSIS — E1122 Type 2 diabetes mellitus with diabetic chronic kidney disease: Secondary | ICD-10-CM | POA: Diagnosis not present

## 2018-02-23 DIAGNOSIS — N183 Chronic kidney disease, stage 3 (moderate): Secondary | ICD-10-CM | POA: Diagnosis not present

## 2018-02-23 LAB — POCT INR: INR: 5.2 — AB (ref 2.0–3.0)

## 2018-02-23 NOTE — Telephone Encounter (Signed)
Sharee Pimple with [facility] would like to know what to do with patients blood sugar. They are following her sliding scale on her Humalog but her sugar levels are still in the 200-300s.Call back @ [phone]   Please advise

## 2018-02-23 NOTE — Telephone Encounter (Signed)
Spoke with [place] and patient already has supplies needed so they will assist pt with how to use properly

## 2018-02-24 ENCOUNTER — Other Ambulatory Visit: Payer: Self-pay | Admitting: *Deleted

## 2018-02-24 ENCOUNTER — Encounter: Payer: Self-pay | Admitting: *Deleted

## 2018-02-24 NOTE — Patient Outreach (Signed)
[facility]) Care Management  02/24/2018  [name] 1945/10/05 [id number]   RN Health Coach Shortness of Breath Follow Up  Referral Date:01/12/2017 Referral Source:[facility] RN Case Manager Reason for Referral:CHF education Insurance:Medicare   Outreach Attempt:  Successful telephone outreach to patient for shortness of breath follow up.  HIPAA verified with patient.  Patient reporting she is feeling better and shortness of breath is better.  Reports weight this morning is down to 159 pounds (has been as low as 157 pounds but down from 166 pounds last week).  Reporting compliance with her medications.  Denies any recent falls.  Patient reporting she has her home oxygen delivered to her from [place] and wears it as needed.  Patient sounds less short of breath than last week but endorses she continues to get short of breath with exertion and will utilize the home oxygen.  Appointments:  Attended medical appointment on 02/20/2018 and has scheduled follow up on 04/10/2018.  Patient encouraged to schedule cardiology follow up appointment.  Plan:  RN Health Coach will make next monthly telephone outreach to patient in the month of September.  [place] [phone] [email or web address]

## 2018-02-25 MED ORDER — INSULIN GLARGINE 300 UNIT/ML ~~LOC~~ SOPN: 12.0000 [IU] | PEN_INJECTOR | Freq: Every morning | SUBCUTANEOUS | 11 refills | Status: DC

## 2018-02-25 NOTE — Telephone Encounter (Signed)
Start Toujeo. Pls see Rx printed Cont SS Thx

## 2018-02-27 ENCOUNTER — Ambulatory Visit (INDEPENDENT_AMBULATORY_CARE_PROVIDER_SITE_OTHER): Payer: Medicare Other | Admitting: Pharmacist

## 2018-02-27 DIAGNOSIS — Z5181 Encounter for therapeutic drug level monitoring: Secondary | ICD-10-CM

## 2018-02-27 DIAGNOSIS — I2510 Atherosclerotic heart disease of native coronary artery without angina pectoris: Secondary | ICD-10-CM | POA: Diagnosis not present

## 2018-02-27 DIAGNOSIS — C3491 Malignant neoplasm of unspecified part of right bronchus or lung: Secondary | ICD-10-CM | POA: Diagnosis not present

## 2018-02-27 DIAGNOSIS — N183 Chronic kidney disease, stage 3 (moderate): Secondary | ICD-10-CM | POA: Diagnosis not present

## 2018-02-27 DIAGNOSIS — I130 Hypertensive heart and chronic kidney disease with heart failure and stage 1 through stage 4 chronic kidney disease, or unspecified chronic kidney disease: Secondary | ICD-10-CM | POA: Diagnosis not present

## 2018-02-27 DIAGNOSIS — E1122 Type 2 diabetes mellitus with diabetic chronic kidney disease: Secondary | ICD-10-CM | POA: Diagnosis not present

## 2018-02-27 DIAGNOSIS — I4819 Other persistent atrial fibrillation: Secondary | ICD-10-CM

## 2018-02-27 DIAGNOSIS — I5033 Acute on chronic diastolic (congestive) heart failure: Secondary | ICD-10-CM | POA: Diagnosis not present

## 2018-02-27 LAB — POCT INR: INR: 1.8 — AB (ref 2.0–3.0)

## 2018-02-27 NOTE — Telephone Encounter (Signed)
Sharee Pimple with [facility] notified

## 2018-03-01 DIAGNOSIS — I130 Hypertensive heart and chronic kidney disease with heart failure and stage 1 through stage 4 chronic kidney disease, or unspecified chronic kidney disease: Secondary | ICD-10-CM | POA: Diagnosis not present

## 2018-03-01 DIAGNOSIS — Z794 Long term (current) use of insulin: Secondary | ICD-10-CM

## 2018-03-01 DIAGNOSIS — I69320 Aphasia following cerebral infarction: Secondary | ICD-10-CM | POA: Diagnosis not present

## 2018-03-01 DIAGNOSIS — K5790 Diverticulosis of intestine, part unspecified, without perforation or abscess without bleeding: Secondary | ICD-10-CM | POA: Diagnosis not present

## 2018-03-01 DIAGNOSIS — I2510 Atherosclerotic heart disease of native coronary artery without angina pectoris: Secondary | ICD-10-CM | POA: Diagnosis not present

## 2018-03-01 DIAGNOSIS — Z905 Acquired absence of kidney: Secondary | ICD-10-CM

## 2018-03-01 DIAGNOSIS — C3491 Malignant neoplasm of unspecified part of right bronchus or lung: Secondary | ICD-10-CM | POA: Diagnosis not present

## 2018-03-01 DIAGNOSIS — N183 Chronic kidney disease, stage 3 (moderate): Secondary | ICD-10-CM | POA: Diagnosis not present

## 2018-03-01 DIAGNOSIS — M109 Gout, unspecified: Secondary | ICD-10-CM | POA: Diagnosis not present

## 2018-03-01 DIAGNOSIS — E785 Hyperlipidemia, unspecified: Secondary | ICD-10-CM

## 2018-03-01 DIAGNOSIS — Z7901 Long term (current) use of anticoagulants: Secondary | ICD-10-CM

## 2018-03-01 DIAGNOSIS — G4733 Obstructive sleep apnea (adult) (pediatric): Secondary | ICD-10-CM | POA: Diagnosis not present

## 2018-03-01 DIAGNOSIS — K219 Gastro-esophageal reflux disease without esophagitis: Secondary | ICD-10-CM

## 2018-03-01 DIAGNOSIS — Z952 Presence of prosthetic heart valve: Secondary | ICD-10-CM

## 2018-03-01 DIAGNOSIS — E1122 Type 2 diabetes mellitus with diabetic chronic kidney disease: Secondary | ICD-10-CM | POA: Diagnosis not present

## 2018-03-01 DIAGNOSIS — I5033 Acute on chronic diastolic (congestive) heart failure: Secondary | ICD-10-CM | POA: Diagnosis not present

## 2018-03-01 DIAGNOSIS — F631 Pyromania: Secondary | ICD-10-CM

## 2018-03-01 DIAGNOSIS — I4891 Unspecified atrial fibrillation: Secondary | ICD-10-CM | POA: Diagnosis not present

## 2018-03-01 DIAGNOSIS — Z79891 Long term (current) use of opiate analgesic: Secondary | ICD-10-CM

## 2018-03-05 ENCOUNTER — Other Ambulatory Visit: Payer: Self-pay | Admitting: Internal Medicine

## 2018-03-09 DIAGNOSIS — I5033 Acute on chronic diastolic (congestive) heart failure: Secondary | ICD-10-CM | POA: Diagnosis not present

## 2018-03-09 DIAGNOSIS — E1122 Type 2 diabetes mellitus with diabetic chronic kidney disease: Secondary | ICD-10-CM | POA: Diagnosis not present

## 2018-03-09 DIAGNOSIS — C3491 Malignant neoplasm of unspecified part of right bronchus or lung: Secondary | ICD-10-CM | POA: Diagnosis not present

## 2018-03-09 DIAGNOSIS — I2510 Atherosclerotic heart disease of native coronary artery without angina pectoris: Secondary | ICD-10-CM | POA: Diagnosis not present

## 2018-03-09 DIAGNOSIS — I130 Hypertensive heart and chronic kidney disease with heart failure and stage 1 through stage 4 chronic kidney disease, or unspecified chronic kidney disease: Secondary | ICD-10-CM | POA: Diagnosis not present

## 2018-03-09 DIAGNOSIS — N183 Chronic kidney disease, stage 3 (moderate): Secondary | ICD-10-CM | POA: Diagnosis not present

## 2018-03-11 ENCOUNTER — Other Ambulatory Visit: Payer: Self-pay | Admitting: Cardiology

## 2018-03-14 ENCOUNTER — Ambulatory Visit (INDEPENDENT_AMBULATORY_CARE_PROVIDER_SITE_OTHER): Payer: Medicare Other | Admitting: Pharmacist

## 2018-03-14 DIAGNOSIS — Z5181 Encounter for therapeutic drug level monitoring: Secondary | ICD-10-CM

## 2018-03-14 DIAGNOSIS — N183 Chronic kidney disease, stage 3 (moderate): Secondary | ICD-10-CM | POA: Diagnosis not present

## 2018-03-14 DIAGNOSIS — I5033 Acute on chronic diastolic (congestive) heart failure: Secondary | ICD-10-CM | POA: Diagnosis not present

## 2018-03-14 DIAGNOSIS — I130 Hypertensive heart and chronic kidney disease with heart failure and stage 1 through stage 4 chronic kidney disease, or unspecified chronic kidney disease: Secondary | ICD-10-CM | POA: Diagnosis not present

## 2018-03-14 DIAGNOSIS — I4819 Other persistent atrial fibrillation: Secondary | ICD-10-CM

## 2018-03-14 DIAGNOSIS — I2510 Atherosclerotic heart disease of native coronary artery without angina pectoris: Secondary | ICD-10-CM | POA: Diagnosis not present

## 2018-03-14 DIAGNOSIS — E1122 Type 2 diabetes mellitus with diabetic chronic kidney disease: Secondary | ICD-10-CM | POA: Diagnosis not present

## 2018-03-14 DIAGNOSIS — C3491 Malignant neoplasm of unspecified part of right bronchus or lung: Secondary | ICD-10-CM | POA: Diagnosis not present

## 2018-03-14 LAB — POCT INR: INR: 2.8 (ref 2.0–3.0)

## 2018-03-17 DIAGNOSIS — Z885 Allergy status to narcotic agent status: Secondary | ICD-10-CM | POA: Diagnosis not present

## 2018-03-17 DIAGNOSIS — Z940 Kidney transplant status: Secondary | ICD-10-CM | POA: Diagnosis not present

## 2018-03-17 DIAGNOSIS — C3490 Malignant neoplasm of unspecified part of unspecified bronchus or lung: Secondary | ICD-10-CM | POA: Diagnosis not present

## 2018-03-17 DIAGNOSIS — N183 Chronic kidney disease, stage 3 (moderate): Secondary | ICD-10-CM | POA: Diagnosis not present

## 2018-03-17 DIAGNOSIS — Z882 Allergy status to sulfonamides status: Secondary | ICD-10-CM | POA: Diagnosis not present

## 2018-03-17 DIAGNOSIS — E0822 Diabetes mellitus due to underlying condition with diabetic chronic kidney disease: Secondary | ICD-10-CM | POA: Diagnosis not present

## 2018-03-17 DIAGNOSIS — Z886 Allergy status to analgesic agent status: Secondary | ICD-10-CM | POA: Diagnosis not present

## 2018-03-17 DIAGNOSIS — Z794 Long term (current) use of insulin: Secondary | ICD-10-CM | POA: Diagnosis not present

## 2018-03-17 DIAGNOSIS — E119 Type 2 diabetes mellitus without complications: Secondary | ICD-10-CM | POA: Diagnosis not present

## 2018-03-17 DIAGNOSIS — Z4822 Encounter for aftercare following kidney transplant: Secondary | ICD-10-CM | POA: Diagnosis not present

## 2018-03-17 DIAGNOSIS — Z881 Allergy status to other antibiotic agents status: Secondary | ICD-10-CM | POA: Diagnosis not present

## 2018-03-17 DIAGNOSIS — C3411 Malignant neoplasm of upper lobe, right bronchus or lung: Secondary | ICD-10-CM | POA: Diagnosis not present

## 2018-03-17 DIAGNOSIS — R809 Proteinuria, unspecified: Secondary | ICD-10-CM | POA: Diagnosis not present

## 2018-03-17 DIAGNOSIS — Z79899 Other long term (current) drug therapy: Secondary | ICD-10-CM | POA: Diagnosis not present

## 2018-03-17 DIAGNOSIS — I10 Essential (primary) hypertension: Secondary | ICD-10-CM | POA: Diagnosis not present

## 2018-03-17 DIAGNOSIS — Z888 Allergy status to other drugs, medicaments and biological substances status: Secondary | ICD-10-CM | POA: Diagnosis not present

## 2018-03-17 DIAGNOSIS — N2889 Other specified disorders of kidney and ureter: Secondary | ICD-10-CM | POA: Diagnosis not present

## 2018-03-17 DIAGNOSIS — Z9889 Other specified postprocedural states: Secondary | ICD-10-CM | POA: Diagnosis not present

## 2018-03-17 DIAGNOSIS — Z952 Presence of prosthetic heart valve: Secondary | ICD-10-CM | POA: Diagnosis not present

## 2018-03-17 DIAGNOSIS — I151 Hypertension secondary to other renal disorders: Secondary | ICD-10-CM | POA: Diagnosis not present

## 2018-03-17 DIAGNOSIS — Z7952 Long term (current) use of systemic steroids: Secondary | ICD-10-CM | POA: Diagnosis not present

## 2018-03-21 ENCOUNTER — Ambulatory Visit (INDEPENDENT_AMBULATORY_CARE_PROVIDER_SITE_OTHER): Payer: Medicare Other | Admitting: Pharmacist Clinician (PhC)/ Clinical Pharmacy Specialist

## 2018-03-21 DIAGNOSIS — C3491 Malignant neoplasm of unspecified part of right bronchus or lung: Secondary | ICD-10-CM | POA: Diagnosis not present

## 2018-03-21 DIAGNOSIS — I4819 Other persistent atrial fibrillation: Secondary | ICD-10-CM

## 2018-03-21 DIAGNOSIS — Z7901 Long term (current) use of anticoagulants: Secondary | ICD-10-CM

## 2018-03-21 DIAGNOSIS — I2510 Atherosclerotic heart disease of native coronary artery without angina pectoris: Secondary | ICD-10-CM | POA: Diagnosis not present

## 2018-03-21 DIAGNOSIS — E1122 Type 2 diabetes mellitus with diabetic chronic kidney disease: Secondary | ICD-10-CM | POA: Diagnosis not present

## 2018-03-21 DIAGNOSIS — I481 Persistent atrial fibrillation: Secondary | ICD-10-CM

## 2018-03-21 DIAGNOSIS — I130 Hypertensive heart and chronic kidney disease with heart failure and stage 1 through stage 4 chronic kidney disease, or unspecified chronic kidney disease: Secondary | ICD-10-CM | POA: Diagnosis not present

## 2018-03-21 DIAGNOSIS — N183 Chronic kidney disease, stage 3 (moderate): Secondary | ICD-10-CM | POA: Diagnosis not present

## 2018-03-21 DIAGNOSIS — Z5181 Encounter for therapeutic drug level monitoring: Secondary | ICD-10-CM

## 2018-03-21 DIAGNOSIS — I5033 Acute on chronic diastolic (congestive) heart failure: Secondary | ICD-10-CM | POA: Diagnosis not present

## 2018-03-21 LAB — POCT INR: INR: 3.1 — AB (ref 2.0–3.0)

## 2018-03-22 DIAGNOSIS — C3490 Malignant neoplasm of unspecified part of unspecified bronchus or lung: Secondary | ICD-10-CM | POA: Insufficient documentation

## 2018-03-22 DIAGNOSIS — I151 Hypertension secondary to other renal disorders: Secondary | ICD-10-CM | POA: Insufficient documentation

## 2018-03-22 DIAGNOSIS — R809 Proteinuria, unspecified: Secondary | ICD-10-CM | POA: Insufficient documentation

## 2018-03-22 DIAGNOSIS — N2889 Other specified disorders of kidney and ureter: Secondary | ICD-10-CM

## 2018-03-24 ENCOUNTER — Other Ambulatory Visit: Payer: Self-pay | Admitting: *Deleted

## 2018-03-24 ENCOUNTER — Ambulatory Visit (INDEPENDENT_AMBULATORY_CARE_PROVIDER_SITE_OTHER): Payer: Medicare Other | Admitting: Pharmacist Clinician (PhC)/ Clinical Pharmacy Specialist

## 2018-03-24 DIAGNOSIS — Z79899 Other long term (current) drug therapy: Secondary | ICD-10-CM | POA: Diagnosis not present

## 2018-03-24 DIAGNOSIS — I4891 Unspecified atrial fibrillation: Secondary | ICD-10-CM

## 2018-03-24 DIAGNOSIS — I4819 Other persistent atrial fibrillation: Secondary | ICD-10-CM

## 2018-03-24 DIAGNOSIS — I481 Persistent atrial fibrillation: Secondary | ICD-10-CM

## 2018-03-24 DIAGNOSIS — Z5181 Encounter for therapeutic drug level monitoring: Secondary | ICD-10-CM | POA: Diagnosis not present

## 2018-03-24 DIAGNOSIS — E785 Hyperlipidemia, unspecified: Secondary | ICD-10-CM | POA: Diagnosis not present

## 2018-03-24 LAB — POCT INR: INR: 3.6 — AB (ref 2.0–3.0)

## 2018-03-24 NOTE — Patient Outreach (Signed)
[facility]) Care Management  03/24/2018  [name] 03/27/1946 [id number]   RN Health Coach Monthly Outreach  Referral Date:01/12/2017 Referral Source:[facility] RN Case Manager Reason for Referral:CHF education Insurance:Medicare   Outreach Attempt:  Outreach attempt #1 to patient for monthly follow up.  Patient answered and stated she was heading out the door to an appointment; requested a telephone call back another day.   Plan:  RN Health Coach will attempt another telephone outreach within the month of September.  [place] [phone] [email or web address]

## 2018-03-25 LAB — LIPID PANEL
Chol/HDL Ratio: 2.4 ratio (ref 0.0–4.4)
Cholesterol, Total: 155 mg/dL (ref 100–199)
HDL: 64 mg/dL (ref >39)
LDL Calculated: 73 mg/dL (ref 0–99)
Triglycerides: 91 mg/dL (ref 0–149)
VLDL Cholesterol Cal: 18 mg/dL (ref 5–40)

## 2018-03-25 LAB — HEPATIC FUNCTION PANEL
ALK PHOS: 94 IU/L (ref 39–117)
ALT: 18 IU/L (ref 0–32)
AST: 25 IU/L (ref 0–40)
Albumin: 3.8 g/dL (ref 3.5–4.8)
BILIRUBIN, DIRECT: 0.12 mg/dL (ref 0.00–0.40)
Bilirubin Total: 0.4 mg/dL (ref 0.0–1.2)
TOTAL PROTEIN: 6.9 g/dL (ref 6.0–8.5)

## 2018-03-27 ENCOUNTER — Ambulatory Visit: Payer: Self-pay | Admitting: *Deleted

## 2018-03-28 ENCOUNTER — Telehealth: Payer: Self-pay

## 2018-03-28 ENCOUNTER — Ambulatory Visit (INDEPENDENT_AMBULATORY_CARE_PROVIDER_SITE_OTHER): Payer: Medicare Other | Admitting: Pharmacist Clinician (PhC)/ Clinical Pharmacy Specialist

## 2018-03-28 DIAGNOSIS — Z5181 Encounter for therapeutic drug level monitoring: Secondary | ICD-10-CM | POA: Diagnosis not present

## 2018-03-28 DIAGNOSIS — I2510 Atherosclerotic heart disease of native coronary artery without angina pectoris: Secondary | ICD-10-CM | POA: Diagnosis not present

## 2018-03-28 DIAGNOSIS — N183 Chronic kidney disease, stage 3 (moderate): Secondary | ICD-10-CM | POA: Diagnosis not present

## 2018-03-28 DIAGNOSIS — I481 Persistent atrial fibrillation: Secondary | ICD-10-CM | POA: Diagnosis not present

## 2018-03-28 DIAGNOSIS — I5033 Acute on chronic diastolic (congestive) heart failure: Secondary | ICD-10-CM | POA: Diagnosis not present

## 2018-03-28 DIAGNOSIS — C3491 Malignant neoplasm of unspecified part of right bronchus or lung: Secondary | ICD-10-CM | POA: Diagnosis not present

## 2018-03-28 DIAGNOSIS — E1122 Type 2 diabetes mellitus with diabetic chronic kidney disease: Secondary | ICD-10-CM | POA: Diagnosis not present

## 2018-03-28 DIAGNOSIS — I4819 Other persistent atrial fibrillation: Secondary | ICD-10-CM

## 2018-03-28 DIAGNOSIS — I130 Hypertensive heart and chronic kidney disease with heart failure and stage 1 through stage 4 chronic kidney disease, or unspecified chronic kidney disease: Secondary | ICD-10-CM | POA: Diagnosis not present

## 2018-03-28 LAB — POCT INR: INR: 2.3 (ref 2.0–3.0)

## 2018-03-28 NOTE — Telephone Encounter (Signed)
-----   Message from Lelon Perla, MD sent at 03/25/2018  6:10 AM EDT ----- No change in meds [name]

## 2018-03-28 NOTE — Telephone Encounter (Signed)
Take lasix 80 mg BID (instead of 60 mg BID) for 3 days and then resume previous dose; bmet one week. [name]

## 2018-03-28 NOTE — Telephone Encounter (Signed)
Pt updated with lab results. Verbalized understanding. Pt also states she weighed 157 last week and today she is 165. She reports dyspnea with walking far distances and upstairs but denies any swelling. Routing to MD for recommendations.

## 2018-03-29 NOTE — Telephone Encounter (Signed)
Spoke with pt and advised that per Dr. [name], he would like for her to increase torsemide to 30 mg x 3 days then resume 20 mg thereafter. Pt verbalized understanding.

## 2018-04-03 ENCOUNTER — Telehealth: Payer: Self-pay | Admitting: Cardiology

## 2018-04-03 ENCOUNTER — Encounter: Payer: Self-pay | Admitting: *Deleted

## 2018-04-03 ENCOUNTER — Other Ambulatory Visit: Payer: Self-pay | Admitting: *Deleted

## 2018-04-03 MED ORDER — TORSEMIDE 20 MG PO TABS: 20.0000 mg | ORAL_TABLET | Freq: Two times a day (BID) | ORAL | 3 refills | Status: DC

## 2018-04-03 NOTE — Telephone Encounter (Signed)
New Message:     *STAT* If patient is at the pharmacy, call can be transferred to refill team.   1. Which medications need to be refilled? (please list name of each medication and dose if known) torsemide (DEMADEX) 20 MG tablet  2. Which pharmacy/location (including street and city if local pharmacy) is medication to be sent to?  Take 1 tablet (20 mg total) by mouth 2 (two) times daily.  3. Do they need a 30 day or 90 day supply? 30 Days

## 2018-04-03 NOTE — Telephone Encounter (Signed)
New Message:     Patient has increased the number of times she is taking torsemide (DEMADEX) 20 MG tablet  Patient has additional questions

## 2018-04-03 NOTE — Patient Outreach (Signed)
[name]) Care Management  04/03/2018  [name] Jul 01, 1946 [id number]   RN Health Coach Monthly Outreach  Referral Date:01/12/2017 Referral Source:[facility] RN Case Manager Reason for Referral:CHF education Insurance:Medicare   Outreach Attempt:  Successful telephone outreach to patient for monthly follow up. HIPAA verified with patient.  States her weight is increased to 161 pounds today; was up to 165 pounds which is up from her baseline of 155-157 pounds.  Patient notified her Cardiologist office last week of weight gain and was instructed to take torsemide 30 mg daily for the next 3 days.  Patient states she has been unable to do so due to her not having a refill available for her torsemide; so she has been taking Lasix 30 mg twice a day.  [facility] contacted and they had been trying to send refill request to Hospitalist physicians.  RN Health Coach contact Dr. Jacalyn Lefevre office to request call from refill request, they stated they would send in the request.  Patient reporting continued shortness of breath, using her home oxygen about twice a week and denies any swelling in lower extremities.  Continues to monitor blood sugars.  This mornings blood sugar was 161 after eating and report fasting mornings blood sugars 90-150's.  Appointments:  Attended appointment with Dr. Christen Bame on 02/20/2018 and has follow up scheduled for 04/10/2018.  Has cardiologist follow up on 10/22/019.  Plan: RN Health Coach contacted Cardiologist office to request them send in refill for patient's torsemide. RN Health Coach requested patient contact Health Coach if she does not receive prescription by tomorrow morning. RN Health Coach will make next telephone outreach call to patient in the month of October.  [place] Coach [phone] [email or web address]

## 2018-04-04 ENCOUNTER — Ambulatory Visit (INDEPENDENT_AMBULATORY_CARE_PROVIDER_SITE_OTHER): Payer: Medicare Other | Admitting: Pharmacist Clinician (PhC)/ Clinical Pharmacy Specialist

## 2018-04-04 DIAGNOSIS — I4819 Other persistent atrial fibrillation: Secondary | ICD-10-CM

## 2018-04-04 DIAGNOSIS — I481 Persistent atrial fibrillation: Secondary | ICD-10-CM | POA: Diagnosis not present

## 2018-04-04 DIAGNOSIS — I130 Hypertensive heart and chronic kidney disease with heart failure and stage 1 through stage 4 chronic kidney disease, or unspecified chronic kidney disease: Secondary | ICD-10-CM | POA: Diagnosis not present

## 2018-04-04 DIAGNOSIS — Z7901 Long term (current) use of anticoagulants: Secondary | ICD-10-CM | POA: Diagnosis not present

## 2018-04-04 DIAGNOSIS — I2510 Atherosclerotic heart disease of native coronary artery without angina pectoris: Secondary | ICD-10-CM | POA: Diagnosis not present

## 2018-04-04 DIAGNOSIS — N183 Chronic kidney disease, stage 3 (moderate): Secondary | ICD-10-CM | POA: Diagnosis not present

## 2018-04-04 DIAGNOSIS — C3491 Malignant neoplasm of unspecified part of right bronchus or lung: Secondary | ICD-10-CM | POA: Diagnosis not present

## 2018-04-04 DIAGNOSIS — E1122 Type 2 diabetes mellitus with diabetic chronic kidney disease: Secondary | ICD-10-CM | POA: Diagnosis not present

## 2018-04-04 DIAGNOSIS — I5033 Acute on chronic diastolic (congestive) heart failure: Secondary | ICD-10-CM | POA: Diagnosis not present

## 2018-04-04 DIAGNOSIS — Z5181 Encounter for therapeutic drug level monitoring: Secondary | ICD-10-CM

## 2018-04-04 LAB — POCT INR: INR: 3.9 — AB (ref 2.0–3.0)

## 2018-04-10 ENCOUNTER — Encounter: Payer: Self-pay | Admitting: Internal Medicine

## 2018-04-10 ENCOUNTER — Ambulatory Visit (INDEPENDENT_AMBULATORY_CARE_PROVIDER_SITE_OTHER): Payer: Medicare Other | Admitting: Internal Medicine

## 2018-04-10 VITALS — BP 132/66 | HR 60 | Temp 98.7°F | Ht 60.0 in | Wt 157.0 lb

## 2018-04-10 DIAGNOSIS — Z794 Long term (current) use of insulin: Secondary | ICD-10-CM | POA: Diagnosis not present

## 2018-04-10 DIAGNOSIS — I151 Hypertension secondary to other renal disorders: Secondary | ICD-10-CM

## 2018-04-10 DIAGNOSIS — E1121 Type 2 diabetes mellitus with diabetic nephropathy: Secondary | ICD-10-CM

## 2018-04-10 DIAGNOSIS — M5440 Lumbago with sciatica, unspecified side: Secondary | ICD-10-CM

## 2018-04-10 DIAGNOSIS — G8929 Other chronic pain: Secondary | ICD-10-CM

## 2018-04-10 DIAGNOSIS — N189 Chronic kidney disease, unspecified: Secondary | ICD-10-CM | POA: Diagnosis not present

## 2018-04-10 DIAGNOSIS — I5022 Chronic systolic (congestive) heart failure: Secondary | ICD-10-CM | POA: Diagnosis not present

## 2018-04-10 DIAGNOSIS — D649 Anemia, unspecified: Secondary | ICD-10-CM | POA: Insufficient documentation

## 2018-04-10 DIAGNOSIS — D631 Anemia in chronic kidney disease: Secondary | ICD-10-CM

## 2018-04-10 DIAGNOSIS — N183 Chronic kidney disease, stage 3 unspecified: Secondary | ICD-10-CM

## 2018-04-10 DIAGNOSIS — J9611 Chronic respiratory failure with hypoxia: Secondary | ICD-10-CM | POA: Diagnosis not present

## 2018-04-10 DIAGNOSIS — R5382 Chronic fatigue, unspecified: Secondary | ICD-10-CM | POA: Diagnosis not present

## 2018-04-10 DIAGNOSIS — Z940 Kidney transplant status: Secondary | ICD-10-CM

## 2018-04-10 DIAGNOSIS — I2510 Atherosclerotic heart disease of native coronary artery without angina pectoris: Secondary | ICD-10-CM

## 2018-04-10 MED ORDER — OXYCODONE-ACETAMINOPHEN 5-325 MG PO TABS: 0.5000 | ORAL_TABLET | Freq: Four times a day (QID) | ORAL | 0 refills | Status: DC | PRN

## 2018-04-10 NOTE — Assessment & Plan Note (Signed)
Metoprolol, Furosemide Labs w/Dr Joelyn Oms On O2

## 2018-04-10 NOTE — Assessment & Plan Note (Signed)
F/u w/Natalia Melchor Amour, MD -- [phone]

## 2018-04-10 NOTE — Assessment & Plan Note (Signed)
On O2 

## 2018-04-10 NOTE — Assessment & Plan Note (Signed)
Amlodipine, Metoprolol, Furosemide

## 2018-04-10 NOTE — Assessment & Plan Note (Signed)
CFS 

## 2018-04-10 NOTE — Progress Notes (Signed)
Subjective:  [name], female    DOB: 10/16/1945  Age: 72 y.o. MRN: [id number]  CC: No chief complaint on file.   HPI [name] presents for CHF, CRF, HTN f/u Feeling better on O2 On 5 mg/d Prednisone Toujeo is working better  Per [facility]:  "Assessment/Plan:   1. 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 Await labs from today. History of proteinuria for many years. Nephrotic range in 2018. Would not biopsy as the biopsy results unlikely change the management in face of lung neoplasm. I would anticipate TG, DM nephropathy or secondary membranous given lung malignancy.  2. Immunosuppression: FK goal 4-7 and myfortic 360 BD. Will d/c myfortic given lung malignancy and start prednisone 5 mg a day  3. Infectious prophylaxis: On dapsone for PCP prophylaxis 4. Hypertension: amlodipine 10 mg daily. Losartan 25 mg and metoprolol, lasix  5. Diabetes: Managed by [facility] and endocrinology HbA1C checked today 6. S/p AVR: Cardiologist Dr. [name]. On Coumadin.  7. HCM: Recommend derm for skin cancer screening. Asked her to see PCP for vaccinations (inactivated only) and age appropriate malignancy screening.  8 Lung CA diagnosed with a stage Ia (T1b, N0, M0) non-small cell lung cancer, well-differentiated adenocarcinoma with biopsy-proven right upper lobe pulmonary nodule diagnosed in May 2019. The patient also has 2 other suspicious nodule in the right upper lobe and left lower lobe but they are too small to characterize at this point and they could represent multifocal disease. Unfortunately the patient has a lot of comorbidities and she is not a good surgical candidate for resection. She was referred by her thoracic surgeon to the multidisciplinary thoracic oncology clinic for evaluation and discussion of other treatment options. She will be reevaluated in October 201 9  9. Dispo: Cont f/up with primary nephrologist. Annual visits in  our transplant clinic  Electronically signed by: Delma Post, MD 03/17/2018 11:48 AM    Electronically signed by: Julieanne Cotton, MD 03/22/18 [phone]"   Outpatient Medications Prior to Visit  Medication Sig Dispense Refill  . amLODipine (NORVASC) 10 MG tablet Take 5 mg by mouth daily. TAKE 1/2 Tablet by mouth daily     . amoxicillin (AMOXIL) 500 MG capsule TAKE 4 CAPSULES BY MOUTH 1 HOUR PRIOR TO DENTAL APPOINTMENT 12 capsule 1  . dapsone 25 MG tablet Take 25 mg by mouth daily.     Marland Kitchen gabapentin (NEURONTIN) 100 MG capsule Take 100 mg by mouth 2 (two) times daily.     Marland Kitchen glucose blood (ONETOUCH VERIO) test strip USE AS INSTRUCTED 4 TIMES A DAY (BEFORE MEALS) & AT BEDTIME (ICD 10-E11.9) 150 each 3  . glucose blood (ONETOUCH VERIO) test strip 1 each by Other route 3 (three) times daily as needed for other. Dx E11.9 150 each 5  . HUMALOG KWIKPEN 100 UNIT/ML KiwkPen IF SUGAR:140-200 USE 8 UNITS,201-230 10 UNITS,231-260 12 UNITS,261-300 14 UNITS,>301 16 UNITS (Patient taking differently: IF SUGAR:140-200 USE 8 UNITS,201-230 10 UNITS,231-260 12 UNITS,261-300 14 UNITS,>301 16 UNITS AS NEEDED FOR HIGH BLOD SUGAR) 27 pen 3  . Insulin Glargine (TOUJEO SOLOSTAR) 300 UNIT/ML SOPN Inject 12 Units into the skin every morning. Titrate up by 1 unit a day for goal sugars of 100-130 up to 40 units a day 1 pen 11  . Insulin Pen Needle (B-D UF III MINI PEN NEEDLES) 31G X 5 MM MISC USE TO ADMINISTER INSULIN FOUR TIMES A DAY DX E11.9 100 each  1  . levothyroxine (SYNTHROID, LEVOTHROID) 25 MCG tablet TAKE 2 TABLETS (50 MCG TOTAL) BY MOUTH DAILY. 60 tablet 5  . lisinopril (PRINIVIL,ZESTRIL) 5 MG tablet Take 5 mg by mouth at bedtime.     . Multiple Vitamins-Minerals (CENTRUM SILVER 50+WOMEN) TABS Take 1 tablet by mouth daily with breakfast.    . ONETOUCH DELICA LANCETS 44I MISC Use to check blood sugars three times a day DX E11.9 100 each 5  . oxyCODONE-acetaminophen (ROXICET) 5-325 MG tablet Take  0.5-1 tablets by mouth every 6 (six) hours as needed for severe pain. 100 tablet 0  . predniSONE (DELTASONE) 5 MG tablet Take 5 mg by mouth daily.  5  . rosuvastatin (CRESTOR) 20 MG tablet Take 1 tablet (20 mg total) by mouth daily. 30 tablet 11  . tacrolimus (PROGRAF) 1 MG capsule Take 3 mg by mouth 2 (two) times daily. Take 3 tablets by mouth twice daily     . torsemide (DEMADEX) 20 MG tablet Take 1 tablet (20 mg total) by mouth 2 (two) times daily. 60 tablet 3  . warfarin (COUMADIN) 5 MG tablet Take 5-7.5 mg by mouth See admin instructions. 7.5 mg on Monday, 5 mg all other days    . warfarin (COUMADIN) 5 MG tablet TAKE 1 TO 1.5 TABLETS BY MOUTH DAILY AS DIRECTED BY COUMADIN CLINIC 120 tablet 0  . levothyroxine (SYNTHROID, LEVOTHROID) 50 MCG tablet Take 1 tablet (50 mcg total) by mouth daily before breakfast. 30 tablet 0  . metoprolol tartrate (LOPRESSOR) 25 MG tablet Take 0.5 tablets (12.5 mg total) by mouth 2 (two) times daily. 30 tablet 0  . mycophenolate (MYFORTIC) 180 MG EC tablet Take 360 mg by mouth 2 (two) times daily.      No facility-administered medications prior to visit.     ROS: Review of Systems  Constitutional: Positive for fatigue. Negative for activity change, appetite change, chills and unexpected weight change.  HENT: Negative for congestion, mouth sores and sinus pressure.   Eyes: Negative for visual disturbance.  Respiratory: Positive for shortness of breath. Negative for cough and chest tightness.   Gastrointestinal: Negative for abdominal pain and nausea.  Genitourinary: Negative for difficulty urinating, frequency and vaginal pain.  Musculoskeletal: Positive for arthralgias, back pain, gait problem and neck stiffness.  Skin: Negative for pallor and rash.  Neurological: Positive for weakness. Negative for dizziness, tremors, numbness and headaches.  Psychiatric/Behavioral: Negative for confusion and sleep disturbance.    Objective:  BP 132/66 (BP Location: Right  Arm, Patient Position: Sitting, Cuff Size: Normal)   Pulse 60   Temp 98.7 F (37.1 C) (Oral)   Ht 5' (1.524 m)   Wt 157 lb (71.2 kg)   SpO2 91%   BMI 30.66 kg/m   BP Readings from Last 3 Encounters:  04/10/18 132/66  02/20/18 132/78  02/09/18 (!) 151/67    Wt Readings from Last 3 Encounters:  04/10/18 157 lb (71.2 kg)  02/20/18 159 lb (72.1 kg)  02/09/18 163 lb 9.6 oz (74.2 kg)    Physical Exam  Constitutional: She appears well-developed. No distress.  HENT:  Head: Normocephalic.  Right Ear: External ear normal.  Left Ear: External ear normal.  Nose: Nose normal.  Mouth/Throat: Oropharynx is clear and moist.  Eyes: Pupils are equal, round, and reactive to light. Conjunctivae are normal. Right eye exhibits no discharge. Left eye exhibits no discharge.  Neck: Normal range of motion. Neck supple. No JVD present. No tracheal deviation present. No thyromegaly present.  Cardiovascular:  Normal rate and regular rhythm.  Murmur heard. Pulmonary/Chest: No stridor. No respiratory distress. She has no wheezes.  Abdominal: Soft. Bowel sounds are normal. She exhibits no distension and no mass. There is no tenderness. There is no rebound and no guarding.  Musculoskeletal: She exhibits tenderness. She exhibits no edema.  Lymphadenopathy:    She has no cervical adenopathy.  Neurological: She displays normal reflexes. No cranial nerve deficit. She exhibits normal muscle tone. Coordination abnormal.  Skin: No rash noted. No erythema.  Psychiatric: She has a normal mood and affect. Her behavior is normal. Judgment and thought content normal.  Cane LS tender L arm shunt  Lab Results  Component Value Date   WBC 5.5 02/08/2018   HGB 10.3 (L) 02/08/2018   HCT 35.6 (L) 02/08/2018   PLT 262 02/08/2018   GLUCOSE 166 (H) 02/08/2018   CHOL 155 03/24/2018   TRIG 91 03/24/2018   HDL 64 03/24/2018   LDLDIRECT 89.3 06/13/2006   LDLCALC 73 03/24/2018   ALT 18 03/24/2018   AST 25 03/24/2018     NA 137 02/08/2018   K 3.6 02/08/2018   CL 95 (L) 02/08/2018   CREATININE 1.69 (H) 02/08/2018   BUN 26 (H) 02/08/2018   CO2 35 (H) 02/08/2018   TSH 2.93 09/02/2017   INR 3.9 (A) 04/04/2018   HGBA1C 8.7 (H) 02/04/2018   MICROALBUR 317.3 (H) 07/02/2016    No results found.  Assessment & Plan:   There are no diagnoses linked to this encounter.   No orders of the defined types were placed in this encounter.    Follow-up: No follow-ups on file.  Walker Kehr, MD

## 2018-04-10 NOTE — Assessment & Plan Note (Signed)
Oxycodone prn -  Percocet so that she could break tab in 1/4 or 1/2's  Potential benefits of a long term oxycodone use as well as potential risks (i.e. addiction risk (low), apnea etc) and complications (i.e. Somnolence, seizures, constipation and others) were explained to the patient and were aknowledged.

## 2018-04-10 NOTE — Assessment & Plan Note (Signed)
Better on Toujeo

## 2018-04-10 NOTE — Assessment & Plan Note (Signed)
Hem ref

## 2018-04-10 NOTE — Assessment & Plan Note (Signed)
Per [facility]:  "Assessment/Plan:   1. 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 Await labs from today. History of proteinuria for many years. Nephrotic range in 2018. Would not biopsy as the biopsy results unlikely change the management in face of lung neoplasm. I would anticipate TG, DM nephropathy or secondary membranous given lung malignancy.  2. Immunosuppression: FK goal 4-7 and myfortic 360 BD. Will d/c myfortic given lung malignancy and start prednisone 5 mg a day  3. Infectious prophylaxis: On dapsone for PCP prophylaxis 4. Hypertension: amlodipine 10 mg daily. Losartan 25 mg and metoprolol, lasix  5. Diabetes: Managed by [facility] and endocrinology HbA1C checked today 6. S/p AVR: Cardiologist Dr. [name]. On Coumadin.  7. HCM: Recommend derm for skin cancer screening. Asked her to see PCP for vaccinations (inactivated only) and age appropriate malignancy screening.  8 Lung CA diagnosed with a stage Ia (T1b, N0, M0) non-small cell lung cancer, well-differentiated adenocarcinoma with biopsy-proven right upper lobe pulmonary nodule diagnosed in May 2019. The patient also has 2 other suspicious nodule in the right upper lobe and left lower lobe but they are too small to characterize at this point and they could represent multifocal disease. Unfortunately the patient has a lot of comorbidities and she is not a good surgical candidate for resection. She was referred by her thoracic surgeon to the multidisciplinary thoracic oncology clinic for evaluation and discussion of other treatment options. She will be reevaluated in October 201 9  9. Dispo: Cont f/up with primary nephrologist. Annual visits in our transplant clinic  Electronically signed by: Delma Post, MD 03/17/2018 11:48 AM    Electronically signed by: Julieanne Cotton, MD 03/22/18 [phone]"

## 2018-04-11 ENCOUNTER — Other Ambulatory Visit (HOSPITAL_COMMUNITY): Payer: Self-pay | Admitting: Nurse Practitioner

## 2018-04-11 DIAGNOSIS — N183 Chronic kidney disease, stage 3 (moderate): Secondary | ICD-10-CM | POA: Diagnosis not present

## 2018-04-11 DIAGNOSIS — I5033 Acute on chronic diastolic (congestive) heart failure: Secondary | ICD-10-CM | POA: Diagnosis not present

## 2018-04-11 DIAGNOSIS — I130 Hypertensive heart and chronic kidney disease with heart failure and stage 1 through stage 4 chronic kidney disease, or unspecified chronic kidney disease: Secondary | ICD-10-CM | POA: Diagnosis not present

## 2018-04-11 DIAGNOSIS — E1122 Type 2 diabetes mellitus with diabetic chronic kidney disease: Secondary | ICD-10-CM | POA: Diagnosis not present

## 2018-04-11 DIAGNOSIS — I2510 Atherosclerotic heart disease of native coronary artery without angina pectoris: Secondary | ICD-10-CM | POA: Diagnosis not present

## 2018-04-11 DIAGNOSIS — C3491 Malignant neoplasm of unspecified part of right bronchus or lung: Secondary | ICD-10-CM | POA: Diagnosis not present

## 2018-04-12 ENCOUNTER — Ambulatory Visit (INDEPENDENT_AMBULATORY_CARE_PROVIDER_SITE_OTHER): Payer: Medicare Other | Admitting: Pharmacist

## 2018-04-12 DIAGNOSIS — I4819 Other persistent atrial fibrillation: Secondary | ICD-10-CM

## 2018-04-12 DIAGNOSIS — Z5181 Encounter for therapeutic drug level monitoring: Secondary | ICD-10-CM | POA: Diagnosis not present

## 2018-04-12 DIAGNOSIS — I481 Persistent atrial fibrillation: Secondary | ICD-10-CM | POA: Diagnosis not present

## 2018-04-12 LAB — POCT INR: INR: 3.3 — AB (ref 2.0–3.0)

## 2018-04-17 ENCOUNTER — Other Ambulatory Visit: Payer: Self-pay | Admitting: Internal Medicine

## 2018-04-17 DIAGNOSIS — Z1231 Encounter for screening mammogram for malignant neoplasm of breast: Secondary | ICD-10-CM

## 2018-05-01 NOTE — Progress Notes (Signed)
HPI: FU aortic stenosis s/p AVR and atrial fibrillation. Patient has a history of embolic CVA. She has a history of coronary artery disease, status post PCI of the right coronary artery. She underwent cardiac catheterization in September 2014 for severe AS. The right coronary was totally occluded but no other coronary disease noted.  Patient had aortic valve replacement with pericardial tissue valve and also had Maze procedure. Carotid Dopplers September 2018 showed 1 to 39% bilateral stenosis.  Abdominal ultrasound October 2018 showed no abdominal aortic aneurysm and no focal stenosis in the aorta or iliacs; abnormal ABIs; PVD followed by Dr Fletcher Anon. Echocardiogram May 2019 showed normal LV function, aortic valve prosthesis with mean gradient 28 mmHg and severe biatrial enlargement.  Nuclear study May 2019 showed ejection fraction 59% and normal perfusion.  Venous Dopplers July 2019 showed no DVT.  Patient has a known lung tumor but has declined resection.  She did receive radiation therapy.  Since she was last seen she denies dyspnea, chest pain, palpitations or syncope.  Current Outpatient Medications  Medication Sig Dispense Refill  . amLODipine (NORVASC) 10 MG tablet Take 5 mg by mouth daily. TAKE 1/2 Tablet by mouth daily     . amoxicillin (AMOXIL) 500 MG capsule TAKE 4 CAPSULES BY MOUTH 1 HOUR PRIOR TO DENTAL APPOINTMENT 12 capsule 1  . dapsone 25 MG tablet Take 25 mg by mouth daily.     Marland Kitchen gabapentin (NEURONTIN) 100 MG capsule Take 100 mg by mouth 2 (two) times daily.     Marland Kitchen glucose blood (ONETOUCH VERIO) test strip USE AS INSTRUCTED 4 TIMES A DAY (BEFORE MEALS) & AT BEDTIME (ICD 10-E11.9) 150 each 3  . glucose blood (ONETOUCH VERIO) test strip 1 each by Other route 3 (three) times daily as needed for other. Dx E11.9 150 each 5  . Insulin Glargine (TOUJEO SOLOSTAR) 300 UNIT/ML SOPN Inject 12 Units into the skin every morning. Titrate up by 1 unit a day for goal sugars of 100-130 up to 40  units a day 1 pen 11  . Insulin Pen Needle (B-D UF III MINI PEN NEEDLES) 31G X 5 MM MISC USE TO ADMINISTER INSULIN FOUR TIMES A DAY DX E11.9 100 each 1  . lisinopril (PRINIVIL,ZESTRIL) 5 MG tablet Take 5 mg by mouth at bedtime.     . metoprolol tartrate (LOPRESSOR) 25 MG tablet Take 0.5 tablets (12.5 mg total) by mouth 2 (two) times daily. TAKE 1 TABLET BY MOUTH TWICE A DAY 90 tablet 1  . Multiple Vitamins-Minerals (CENTRUM SILVER 50+WOMEN) TABS Take 1 tablet by mouth daily with breakfast.    . ONETOUCH DELICA LANCETS 67R MISC Use to check blood sugars three times a day DX E11.9 100 each 5  . oxyCODONE-acetaminophen (ROXICET) 5-325 MG tablet Take 0.5-1 tablets by mouth every 6 (six) hours as needed for severe pain. 100 tablet 0  . predniSONE (DELTASONE) 5 MG tablet Take 5 mg by mouth daily.  5  . rosuvastatin (CRESTOR) 20 MG tablet Take 1 tablet (20 mg total) by mouth daily. 30 tablet 11  . tacrolimus (PROGRAF) 1 MG capsule Take 3 mg by mouth 2 (two) times daily. Take 3 tablets by mouth twice daily     . torsemide (DEMADEX) 20 MG tablet Take 1 tablet (20 mg total) by mouth 2 (two) times daily. 60 tablet 3  . warfarin (COUMADIN) 5 MG tablet Take 5-7.5 mg by mouth See admin instructions. 7.5 mg on Monday, 5 mg all other  days    . levothyroxine (SYNTHROID, LEVOTHROID) 50 MCG tablet Take 1 tablet (50 mcg total) by mouth daily before breakfast. 30 tablet 0   No current facility-administered medications for this visit.      Past Medical History:  Diagnosis Date  . Adenocarcinoma of lung, stage 1, right ([place]) 11/25/2017  . Anemia, iron deficiency    of chronic disease  . Aortic stenosis    a. Severe AS by echo 11/2012.  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 Asystole ([place])    a. During ENT surgery 2005: developed marked asystole requiring CPR, felt due to vagal reaction (cath nonobst dz).  . Carotid artery disease ([place])    a. Carotid Dopplers performed in August 2013 showed  40-59% left stenosis and 0-39% right; f/u recommended in 2 years.   . Cerebrovascular accident [facility]) 2009   a. LMCA infarct felt embolic 2536, maintained on chronic coumadin.; denies residual on 04/05/2013  . CHF (congestive heart failure) ([place])   . Cholelithiasis   . Chronic cough onset 03/2010   Dr Melvyn Novas  . Chronic Persistent Atrial Fibrillation 12/31/2008   Qualifier: Diagnosis of  By: [name]    . Coronary artery disease 05/2002   a. Ant MI 2003 s/p PTCA/stent to RCA.   . Diverticulosis of colon   . Esophagitis, reflux   . ESRD (end stage renal disease) ([place])    a. Mass on L kidney per pt s/p nephrectomy - pt states not cancer - [facility] notes indicate ESRD due to HTN/DM - was previously on HD. b. Kidney transplant 02/2011.  Marland Kitchen GERD (gastroesophageal reflux disease)   . Gout   . Helicobacter pylori (H. pylori) infection    hx of  . Hemorrhoids   . Hx of colonic polyps    adenomatous  . Hyperlipidemia   . Hypertension   . Lung nodule seen on imaging study 04/07/2013   1.0 cm ground glass opacity RUL  . Myocardial infarction ([place]) 2003  . Pericardial effusion    a. Small by echo 11/2011.  Marland Kitchen Pruritic dermatitis    treated with steriods/UV light  . S/P aortic valve replacement with bioprosthetic valve and maze procedure 04/12/2013   58mm [facility] Ease bovine pericardial tissue valve   . S/P Maze operation for atrial fibrillation 04/12/2013   Complete bilateral atrial lesion set using bipolar radiofrequency and cryothermy ablation with clipping of LA appendage  . Sleep apnea    Pt says testing was positive, intolerant of CPAP.  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  . Type II diabetes mellitus ([place])   . Unspecified hearing loss     Past Surgical History:  Procedure Laterality Date  . AORTIC VALVE REPLACEMENT N/A  04/12/2013   Procedure: AORTIC VALVE REPLACEMENT (AVR);  Surgeon: Rexene Alberts, MD;  Location: [place];  Service: Open Heart Surgery;  Laterality: N/A;  . ARTERIOVENOUS GRAFT PLACEMENT Left   . ARTERIOVENOUS GRAFT PLACEMENT Left    "I've had 2 on my left; had one removed" (04/05/2013)   . ARTERY EXPLORATION Right 04/11/2013   Procedure: ARTERY EXPLORATION;  Surgeon: Rexene Alberts, MD;  Location: [place];  Service: Open Heart Surgery;  Laterality: Right;  Right carotid artery exploration  . AV FISTULA PLACEMENT Right   . AV FISTULA REPAIR Right    "took  it out" ((/18/2014)  . CARDIOVERSION  05/29/2012   Procedure: CARDIOVERSION;  Surgeon: Lelon Perla, MD;  Location: [facility] ENDOSCOPY;  Service: Cardiovascular;  Laterality: N/A;  . CHOLECYSTECTOMY  2009   with hernia removal  . CORONARY ANGIOPLASTY WITH STENT PLACEMENT Right    coronary artery  . INSERTION OF DIALYSIS CATHETER Bilateral    "over the years; took them both out" (04/05/2013)  . INTRAOPERATIVE TRANSESOPHAGEAL ECHOCARDIOGRAM N/A 04/11/2013   Procedure: INTRAOPERATIVE TRANSESOPHAGEAL ECHOCARDIOGRAM;  Surgeon: Rexene Alberts, MD;  Location: [place];  Service: Open Heart Surgery;  Laterality: N/A;  . INTRAOPERATIVE TRANSESOPHAGEAL ECHOCARDIOGRAM N/A 04/12/2013   Procedure: INTRAOPERATIVE TRANSESOPHAGEAL ECHOCARDIOGRAM;  Surgeon: Rexene Alberts, MD;  Location: [place];  Service: Open Heart Surgery;  Laterality: N/A;  . KIDNEY TRANSPLANT  03/16/11  . LEFT AND RIGHT HEART CATHETERIZATION WITH CORONARY ANGIOGRAM N/A 04/06/2013   Procedure: LEFT AND RIGHT HEART CATHETERIZATION WITH CORONARY ANGIOGRAM;  Surgeon: Blane Ohara, MD;  Location: [facility] CATH LAB;  Service: Cardiovascular;  Laterality: N/A;  . MAZE N/A 04/12/2013   Procedure: MAZE;  Surgeon: Rexene Alberts, MD;  Location: [place];  Service: Open Heart Surgery;  Laterality: N/A;  . NASAL RECONSTRUCTION WITH SEPTAL REPAIR     "took it out" (04/05/2013)  . NEPHRECTOMY Left 2010   no CA on  bx  . TONSILLECTOMY    . TOTAL ABDOMINAL HYSTERECTOMY    . TUBAL LIGATION      Social History   Socioeconomic History  . Marital status: Married    Spouse name: Not on file  . Number of children: 5  . Years of education: 34  . Highest education level: Not on file  Occupational History  . Occupation: retired    Fish farm manager: RETIRED  Social Needs  . Financial resource strain: Not on file  . Food insecurity:    Worry: Not on file    Inability: Not on file  . Transportation needs:    Medical: Not on file    Non-medical: Not on file  Tobacco Use  . Smoking status: Former Smoker    Packs/day: 1.00    Years: 30.00    Pack years: 30.00    Types: Cigarettes    Last attempt to quit: 07/19/2001    Years since quitting: 16.8  . Smokeless tobacco: Never Used  Substance and Sexual Activity  . Alcohol use: No  . Drug use: No  . Sexual activity: Not Currently  Lifestyle  . Physical activity:    Days per week: Not on file    Minutes per session: Not on file  . Stress: Not on file  Relationships  . Social connections:    Talks on phone: Not on file    Gets together: Not on file    Attends religious service: Not on file    Active member of club or organization: Not on file    Attends meetings of clubs or organizations: Not on file    Relationship status: Not on file  . Intimate partner violence:    Fear of current or ex partner: Not on file    Emotionally abused: Not on file    Physically abused: Not on file    Forced sexual activity: Not on file  Other Topics Concern  . Not on file  Social History Narrative   Patient signed a Designated Party Release to allow her spouse [name] and family and five children to have access to her medical records/information.  Patient lives with husband in a 2 story home.  Has 5 children. Retired from Personal assistant.  Education: 3 years of college.     Family History  Problem Relation Age of Onset  . Stroke Father   . Hypertension Mother     . Diabetes Other   . Diabetes Maternal Grandmother   . Diabetes Son   . Breast cancer Neg Hx     ROS: no fevers or chills, productive cough, hemoptysis, dysphasia, odynophagia, melena, hematochezia, dysuria, hematuria, rash, seizure activity, orthopnea, PND, pedal edema, claudication. Remaining systems are negative.  Physical Exam: Well-developed well-nourished in no acute distress.  Skin is warm and dry.  HEENT is normal.  Neck is supple.  Chest is clear to auscultation with normal expansion.  Cardiovascular exam is regular rate and rhythm.  3/6 systolic murmur left sternal border.  No diastolic murmur. Abdominal exam nontender or distended. No masses palpated. Extremities show no edema. neuro grossly intact  A/P  1 status post aortic valve replacement-continue SBE prophylaxis.  2 hypertension-blood pressure is mildly elevated.  However she has not taken her medications this morning.  She will follow at home and we will advance regimen as needed.  3 coronary artery disease-continue statin.  Not on aspirin given need for anticoagulation.  4 chronic diastolic congestive heart failure-she appears to be euvolemic.  Continue present dose of diuretic.  We discussed fluid restriction and low-sodium diet.  Potassium and renal function monitored by primary care.  5 hyperlipidemia-continue statin.  6 peripheral vascular disease-medical therapy per Dr. Fletcher Anon.  7 paroxysmal atrial fibrillation-patient is in sinus rhythm on examination today.  Plan to continue Coumadin and beta-blocker.  8 probable adenocarcinoma of the lung-declined resection previously.  She has been radiated and this is followed by oncology.  [name], MD

## 2018-05-02 ENCOUNTER — Other Ambulatory Visit: Payer: Self-pay | Admitting: *Deleted

## 2018-05-02 NOTE — Patient Outreach (Signed)
[facility]) Care Management  05/02/2018  [name] [birth year] [id number]   RN Health Coach Monthly Outreach  Referral Date:01/12/2017 Referral Source:[facility] RN Case Manager Reason for Referral:CHF education Insurance:Medicare   Outreach Attempt:  Outreach attempt #1 to patient for monthly follow up.  Patient answered and stated she does not feel well and requesting a call back.  Plan:  RN Health Coach will attempt another outreach within the month of October.  [place] [phone] [name]

## 2018-05-03 DIAGNOSIS — N2581 Secondary hyperparathyroidism of renal origin: Secondary | ICD-10-CM | POA: Diagnosis not present

## 2018-05-03 DIAGNOSIS — E119 Type 2 diabetes mellitus without complications: Secondary | ICD-10-CM | POA: Diagnosis not present

## 2018-05-03 DIAGNOSIS — Z940 Kidney transplant status: Secondary | ICD-10-CM | POA: Diagnosis not present

## 2018-05-09 ENCOUNTER — Encounter: Payer: Self-pay | Admitting: Cardiology

## 2018-05-09 ENCOUNTER — Ambulatory Visit (INDEPENDENT_AMBULATORY_CARE_PROVIDER_SITE_OTHER): Payer: Medicare Other | Admitting: Pharmacist Clinician (PhC)/ Clinical Pharmacy Specialist

## 2018-05-09 ENCOUNTER — Ambulatory Visit (INDEPENDENT_AMBULATORY_CARE_PROVIDER_SITE_OTHER): Payer: Medicare Other | Admitting: Cardiology

## 2018-05-09 ENCOUNTER — Encounter: Payer: Self-pay | Admitting: Pharmacist Clinician (PhC)/ Clinical Pharmacy Specialist

## 2018-05-09 VITALS — BP 152/72 | HR 68 | Ht 60.0 in | Wt 159.0 lb

## 2018-05-09 DIAGNOSIS — Z952 Presence of prosthetic heart valve: Secondary | ICD-10-CM

## 2018-05-09 DIAGNOSIS — I4891 Unspecified atrial fibrillation: Secondary | ICD-10-CM

## 2018-05-09 DIAGNOSIS — I2510 Atherosclerotic heart disease of native coronary artery without angina pectoris: Secondary | ICD-10-CM

## 2018-05-09 DIAGNOSIS — I10 Essential (primary) hypertension: Secondary | ICD-10-CM | POA: Diagnosis not present

## 2018-05-09 DIAGNOSIS — N2581 Secondary hyperparathyroidism of renal origin: Secondary | ICD-10-CM | POA: Diagnosis not present

## 2018-05-09 DIAGNOSIS — E7800 Pure hypercholesterolemia, unspecified: Secondary | ICD-10-CM | POA: Diagnosis not present

## 2018-05-09 DIAGNOSIS — Z5181 Encounter for therapeutic drug level monitoring: Secondary | ICD-10-CM | POA: Diagnosis not present

## 2018-05-09 LAB — POCT INR: INR: 3.7 — AB (ref 2.0–3.0)

## 2018-05-09 NOTE — Patient Instructions (Signed)
Medication Instructions:  NO CHANGE If you need a refill on your cardiac medications before your next appointment, please call your pharmacy.   Lab work: If you have labs (blood work) drawn today and your tests are completely normal, you will receive your results only by: . MyChart Message (if you have MyChart) OR . A paper copy in the mail If you have any lab test that is abnormal or we need to change your treatment, we will call you to review the results.  Follow-Up: At [facility], you and your health needs are our priority.  As part of our continuing mission to provide you with exceptional heart care, we have created designated Provider Care Teams.  These Care Teams include your primary Cardiologist (physician) and Advanced Practice Providers (APPs -  Physician Assistants and Nurse Practitioners) who all work together to provide you with the care you need, when you need it. You will need a follow up appointment in 12 months.  Please call our office 2 months in advance to schedule this appointment.  You may see BRIAN CRENSHAW MD or one of the following Advanced Practice Providers on your designated Care Team:   Luke Kilroy, PA-C Krista Kroeger, PA-C . Callie Goodrich, PA-C     

## 2018-05-10 ENCOUNTER — Other Ambulatory Visit: Payer: Self-pay | Admitting: *Deleted

## 2018-05-10 NOTE — Patient Outreach (Signed)
[facility]) Care Management  05/10/2018  [name] 06/17/1946 [id number]   RN Health Coach Monthly Outreach  Referral Date:01/12/2017 Referral Source:[facility] RN Case Manager Reason for Referral:CHF education Insurance:Medicare   Outreach Attempt:  Outreach attempt #2 to patient for monthly follow up. No answer. RN Health Coach left HIPAA compliant voicemail message along with contact information.  Plan:  RN Health Coach will make another outreach attempt in the month of November if no return call back from patient.  [name] [phone] [name]

## 2018-05-11 NOTE — Progress Notes (Signed)
This encounter was created in error - please disregard.

## 2018-05-22 ENCOUNTER — Other Ambulatory Visit: Payer: Self-pay | Admitting: *Deleted

## 2018-05-22 ENCOUNTER — Encounter: Payer: Self-pay | Admitting: *Deleted

## 2018-05-22 NOTE — Patient Outreach (Signed)
[name]) Care Management  [place]  05/22/2018   [name] [birth year] [id number]   RN Health Coach Monthly Outreach   Referral Date: 01/12/2017 Referral Source: [facility] RN Case Manager Reason for Referral: CHF education Insurance: Medicare   Outreach Attempt:  Successful telephone outreach to patient for monthly follow up.  HIPAA verified with patient.  Patient stating she is feeling well.  Denies any shortness of breath or lower extremity edema.  States she only uses her oxygen as needed, has not required any in the past 3 week.  Weight this morning was 151 pounds.  Continues to monitor blood sugars.  Fasting blood sugar this morning was 171 with fasting ranges of 90-140's.  Patient reporting taking her Toujeo (40 units) as needed when blood sugars >130. Reviewed Toujeo instructions with patient and patient confirms she has titrated dose up to 40 units; discussed taking insulin daily per instructions and contacting physician for sustained blood sugars below 100 for adjustment instructions.  Encounter Medications:  Outpatient Encounter Medications as of 05/22/2018  Medication Sig Note  . amLODipine (NORVASC) 10 MG tablet Take 5 mg by mouth daily. TAKE 1/2 Tablet by mouth daily    . amoxicillin (AMOXIL) 500 MG capsule TAKE 4 CAPSULES BY MOUTH 1 HOUR PRIOR TO DENTAL APPOINTMENT   . dapsone 25 MG tablet Take 25 mg by mouth daily.    Marland Kitchen gabapentin (NEURONTIN) 100 MG capsule Take 100 mg by mouth 2 (two) times daily.  05/22/2018: Reports taking once a day mostly and twice a day when she feels she needs to  . glucose blood (ONETOUCH VERIO) test strip USE AS INSTRUCTED 4 TIMES A DAY (BEFORE MEALS) & AT BEDTIME (ICD 10-E11.9)   . glucose blood (ONETOUCH VERIO) test strip 1 each by Other route 3 (three) times daily as needed for other. Dx E11.9   . Insulin Glargine (TOUJEO SOLOSTAR) 300 UNIT/ML SOPN Inject 12 Units into the skin every morning. Titrate up by 1 unit a day  for goal sugars of 100-130 up to 40 units a day 05/22/2018: Reports taking 40 units as needed when blood sugars > 130  . Insulin Pen Needle (B-D UF III MINI PEN NEEDLES) 31G X 5 MM MISC USE TO ADMINISTER INSULIN FOUR TIMES A DAY DX E11.9   . lisinopril (PRINIVIL,ZESTRIL) 5 MG tablet Take 5 mg by mouth at bedtime.    . metoprolol tartrate (LOPRESSOR) 25 MG tablet Take 0.5 tablets (12.5 mg total) by mouth 2 (two) times daily. TAKE 1 TABLET BY MOUTH TWICE A DAY   . Multiple Vitamins-Minerals (CENTRUM SILVER 50+WOMEN) TABS Take 1 tablet by mouth daily with breakfast.   . ONETOUCH DELICA LANCETS 15Q MISC Use to check blood sugars three times a day DX E11.9   . oxyCODONE-acetaminophen (ROXICET) 5-325 MG tablet Take 0.5-1 tablets by mouth every 6 (six) hours as needed for severe pain.   . predniSONE (DELTASONE) 5 MG tablet Take 5 mg by mouth daily.   . rosuvastatin (CRESTOR) 20 MG tablet Take 1 tablet (20 mg total) by mouth daily.   . tacrolimus (PROGRAF) 1 MG capsule Take 3 mg by mouth 2 (two) times daily. Take 3 tablets by mouth twice daily    . torsemide (DEMADEX) 20 MG tablet Take 1 tablet (20 mg total) by mouth 2 (two) times daily.   Marland Kitchen warfarin (COUMADIN) 5 MG tablet Take 5-7.5 mg by mouth See admin instructions. 7.5 mg on Monday, 5 mg all other days   .  levothyroxine (SYNTHROID, LEVOTHROID) 50 MCG tablet Take 1 tablet (50 mcg total) by mouth daily before breakfast. 05/22/2018: Reports continues to take   No facility-administered encounter medications on file as of 05/22/2018.     Functional Status:  In your present state of health, do you have any difficulty performing the following activities: 04/03/2018 02/04/2018  Hearing? N N  Vision? N N  Difficulty concentrating or making decisions? N N  Walking or climbing stairs? Y N  Dressing or bathing? N N  Doing errands, shopping? N N  Preparing Food and eating ? N -  Using the Toilet? N -  In the past six months, have you accidently leaked urine? Y  -  Do you have problems with loss of bowel control? N -  Managing your Medications? N -  Managing your Finances? N -  Housekeeping or managing your Housekeeping? Y -  Some recent data might be hidden    Fall/Depression Screening: Fall Risk  05/22/2018 02/09/2018 01/24/2018  Falls in the past year? 1 No Yes  Comment no falls in the last 6 months - denies any falls in the past 3 months  Number falls in past yr: 1 - 1  Comment - - -  Injury with Fall? 0 - No  Risk for fall due to : History of fall(s);Impaired balance/gait;Medication side effect;Impaired mobility;Impaired vision - History of fall(s);Impaired balance/gait;Impaired mobility;Medication side effect  Follow up Falls prevention discussed;Education provided - Falls prevention discussed;Falls evaluation completed   PHQ 2/9 Scores 10/06/2017 05/05/2017 01/21/2017 01/12/2017 11/21/2015 11/11/2014 06/04/2013  PHQ - 2 Score 0 0 0 0 0 0 0    [facility] CM Care Plan Problem One     Most Recent Value  Care Plan Problem One  Knowledge deficiet related to self care management of diabetes and heart failure  Role Documenting the Problem One  [place] for Problem One  Active  [facility]N Long Term Goal   Patient will report a decrease in her Hgb A1C by 0.5 points in the next 90 days  [facility] Long Term Goal Start Date  05/22/18  Interventions for Problem One Long Term Goal  Reviewed and discussed current care plan and goals, encouraged patient to contact primary care to schedule follow up appointment, discussed healthier meal and drink options, discussed with patient eating meals throughtout the day and not skipping breakfast  [facility] CM Short Term Goal #1   Patient will report no emergency room visits or hospitalizations within the next 60 days.  [facility] CM Short Term Goal #1 Start Date  05/22/18  [facility] CM Short Term Goal #1 Met Date  05/22/18  Interventions for Short Term Goal #1  Reviewed signs and symptoms of heart failure, reviewed medications and encouraged  compliance, reviewed timing of taking demadex to avoid being up all night voiding, encouraged continuing to weigh self daily  [facility] CM Short Term Goal #2   Patient will report taking insulin daily in the next 60 days.  [facility] CM Short Term Goal #2 Start Date  05/22/18  [name] CM Short Term Goal #2 Met Date  05/22/18  Interventions for Short Term Goal #2  Reviewed insulin titration orders with patient, encouraged patient to continue to monitor blood sugars and to notify physician for sustained blood sugars <100, reviewed signs and symptoms of hypo and hyperglycemia     Appointments:  Reports last seeing primary care provider on 04/10/2018 and states she needs to reschedule her follow up because her 07/18/2018 appointment  was cancelled by the office.    Plan: RN Health Coach will send primary care provider Quarterly Update. RN Health Coach will make next telephone outreach to patient in the month of January.  [place] [phone] [email or web address]

## 2018-05-23 ENCOUNTER — Ambulatory Visit
Admission: RE | Admit: 2018-05-23 | Discharge: 2018-05-23 | Disposition: A | Discharge status: Home / Self Care | Payer: Medicare Other | Source: Ambulatory Visit | Attending: Internal Medicine | Admitting: Internal Medicine

## 2018-05-23 DIAGNOSIS — Z1231 Encounter for screening mammogram for malignant neoplasm of breast: Secondary | ICD-10-CM

## 2018-05-24 ENCOUNTER — Ambulatory Visit (INDEPENDENT_AMBULATORY_CARE_PROVIDER_SITE_OTHER): Payer: Medicare Other | Admitting: Pharmacist

## 2018-05-24 DIAGNOSIS — Z5181 Encounter for therapeutic drug level monitoring: Secondary | ICD-10-CM

## 2018-05-24 DIAGNOSIS — I4891 Unspecified atrial fibrillation: Secondary | ICD-10-CM

## 2018-05-24 LAB — POCT INR: INR: 3.2 — AB (ref 2.0–3.0)

## 2018-05-28 ENCOUNTER — Other Ambulatory Visit: Payer: Self-pay | Admitting: Cardiology

## 2018-05-30 ENCOUNTER — Other Ambulatory Visit: Payer: Self-pay | Admitting: Cardiology

## 2018-05-30 ENCOUNTER — Other Ambulatory Visit: Payer: Self-pay | Admitting: Internal Medicine

## 2018-06-06 ENCOUNTER — Encounter (HOSPITAL_COMMUNITY): Payer: Self-pay

## 2018-06-06 ENCOUNTER — Ambulatory Visit (HOSPITAL_COMMUNITY)
Admission: RE | Admit: 2018-06-06 | Discharge: 2018-06-06 | Disposition: A | Discharge status: Home / Self Care | Payer: Medicare Other | Source: Ambulatory Visit | Attending: Internal Medicine | Admitting: Internal Medicine

## 2018-06-06 ENCOUNTER — Other Ambulatory Visit: Payer: Self-pay | Admitting: *Deleted

## 2018-06-06 ENCOUNTER — Encounter: Payer: Self-pay | Admitting: *Deleted

## 2018-06-06 ENCOUNTER — Inpatient Hospital Stay: Payer: Medicare Other | Attending: Internal Medicine

## 2018-06-06 DIAGNOSIS — Z8673 Personal history of transient ischemic attack (TIA), and cerebral infarction without residual deficits: Secondary | ICD-10-CM | POA: Insufficient documentation

## 2018-06-06 DIAGNOSIS — Z79899 Other long term (current) drug therapy: Secondary | ICD-10-CM | POA: Insufficient documentation

## 2018-06-06 DIAGNOSIS — I2510 Atherosclerotic heart disease of native coronary artery without angina pectoris: Secondary | ICD-10-CM | POA: Insufficient documentation

## 2018-06-06 DIAGNOSIS — E279 Disorder of adrenal gland, unspecified: Secondary | ICD-10-CM | POA: Insufficient documentation

## 2018-06-06 DIAGNOSIS — I132 Hypertensive heart and chronic kidney disease with heart failure and with stage 5 chronic kidney disease, or end stage renal disease: Secondary | ICD-10-CM | POA: Insufficient documentation

## 2018-06-06 DIAGNOSIS — E1122 Type 2 diabetes mellitus with diabetic chronic kidney disease: Secondary | ICD-10-CM | POA: Diagnosis not present

## 2018-06-06 DIAGNOSIS — C3411 Malignant neoplasm of upper lobe, right bronchus or lung: Secondary | ICD-10-CM | POA: Insufficient documentation

## 2018-06-06 DIAGNOSIS — I517 Cardiomegaly: Secondary | ICD-10-CM | POA: Insufficient documentation

## 2018-06-06 DIAGNOSIS — Z7901 Long term (current) use of anticoagulants: Secondary | ICD-10-CM | POA: Insufficient documentation

## 2018-06-06 DIAGNOSIS — C3490 Malignant neoplasm of unspecified part of unspecified bronchus or lung: Secondary | ICD-10-CM

## 2018-06-06 DIAGNOSIS — R918 Other nonspecific abnormal finding of lung field: Secondary | ICD-10-CM | POA: Insufficient documentation

## 2018-06-06 DIAGNOSIS — Z905 Acquired absence of kidney: Secondary | ICD-10-CM | POA: Diagnosis not present

## 2018-06-06 DIAGNOSIS — Z953 Presence of xenogenic heart valve: Secondary | ICD-10-CM | POA: Insufficient documentation

## 2018-06-06 DIAGNOSIS — E785 Hyperlipidemia, unspecified: Secondary | ICD-10-CM | POA: Insufficient documentation

## 2018-06-06 DIAGNOSIS — Z923 Personal history of irradiation: Secondary | ICD-10-CM | POA: Diagnosis not present

## 2018-06-06 DIAGNOSIS — Z794 Long term (current) use of insulin: Secondary | ICD-10-CM | POA: Diagnosis not present

## 2018-06-06 DIAGNOSIS — N186 End stage renal disease: Secondary | ICD-10-CM | POA: Insufficient documentation

## 2018-06-06 DIAGNOSIS — C3491 Malignant neoplasm of unspecified part of right bronchus or lung: Secondary | ICD-10-CM

## 2018-06-06 DIAGNOSIS — F419 Anxiety disorder, unspecified: Secondary | ICD-10-CM | POA: Insufficient documentation

## 2018-06-06 DIAGNOSIS — I509 Heart failure, unspecified: Secondary | ICD-10-CM | POA: Insufficient documentation

## 2018-06-06 DIAGNOSIS — Z940 Kidney transplant status: Secondary | ICD-10-CM | POA: Diagnosis not present

## 2018-06-06 DIAGNOSIS — K219 Gastro-esophageal reflux disease without esophagitis: Secondary | ICD-10-CM | POA: Insufficient documentation

## 2018-06-06 LAB — CBC WITH DIFFERENTIAL (CANCER CENTER ONLY)
Abs Immature Granulocytes: 0.03 10*3/uL (ref 0.00–0.07)
BASOS ABS: 0 10*3/uL (ref 0.0–0.1)
BASOS PCT: 0 %
EOS ABS: 0.1 10*3/uL (ref 0.0–0.5)
EOS PCT: 1 %
HCT: 37.1 % (ref 36.0–46.0)
Hemoglobin: 11.5 g/dL — ABNORMAL LOW (ref 12.0–15.0)
IMMATURE GRANULOCYTES: 0 %
Lymphocytes Relative: 18 %
Lymphs Abs: 1.3 10*3/uL (ref 0.7–4.0)
MCH: 24.3 pg — ABNORMAL LOW (ref 26.0–34.0)
MCHC: 31 g/dL (ref 30.0–36.0)
MCV: 78.3 fL — ABNORMAL LOW (ref 80.0–100.0)
Monocytes Absolute: 0.8 10*3/uL (ref 0.1–1.0)
Monocytes Relative: 11 %
NEUTROS ABS: 5.1 10*3/uL (ref 1.7–7.7)
Neutrophils Relative %: 70 %
PLATELETS: 179 10*3/uL (ref 150–400)
RBC: 4.74 MIL/uL (ref 3.87–5.11)
RDW: 19.8 % — ABNORMAL HIGH (ref 11.5–15.5)
WBC: 7.4 10*3/uL (ref 4.0–10.5)
nRBC: 0 % (ref 0.0–0.2)

## 2018-06-06 LAB — CMP (CANCER CENTER ONLY)
ALT: 18 U/L (ref 0–44)
AST: 18 U/L (ref 15–41)
Albumin: 3.1 g/dL — ABNORMAL LOW (ref 3.5–5.0)
Alkaline Phosphatase: 105 U/L (ref 38–126)
Anion gap: 10 (ref 5–15)
BILIRUBIN TOTAL: 0.5 mg/dL (ref 0.3–1.2)
BUN: 42 mg/dL — ABNORMAL HIGH (ref 8–23)
CALCIUM: 10.4 mg/dL — AB (ref 8.9–10.3)
CO2: 28 mmol/L (ref 22–32)
CREATININE: 2.25 mg/dL — AB (ref 0.44–1.00)
Chloride: 104 mmol/L (ref 98–111)
GFR, EST NON AFRICAN AMERICAN: 20 mL/min — AB (ref >60)
GFR, Est AFR Am: 24 mL/min — ABNORMAL LOW (ref >60)
Glucose, Bld: 177 mg/dL — ABNORMAL HIGH (ref 70–99)
Potassium: 3.7 mmol/L (ref 3.5–5.1)
Sodium: 142 mmol/L (ref 135–145)
TOTAL PROTEIN: 7.1 g/dL (ref 6.5–8.1)

## 2018-06-08 ENCOUNTER — Inpatient Hospital Stay (HOSPITAL_BASED_OUTPATIENT_CLINIC_OR_DEPARTMENT_OTHER): Payer: Medicare Other | Admitting: Internal Medicine

## 2018-06-08 ENCOUNTER — Ambulatory Visit
Admission: RE | Admit: 2018-06-08 | Discharge: 2018-06-08 | Disposition: A | Discharge status: Home / Self Care | Payer: Medicare Other | Source: Ambulatory Visit | Attending: Radiation Oncology | Admitting: Radiation Oncology

## 2018-06-08 ENCOUNTER — Other Ambulatory Visit: Payer: Self-pay

## 2018-06-08 ENCOUNTER — Encounter: Payer: Self-pay | Admitting: Internal Medicine

## 2018-06-08 ENCOUNTER — Encounter: Payer: Self-pay | Admitting: Radiation Oncology

## 2018-06-08 VITALS — BP 182/60 | HR 53 | Temp 98.3°F | Resp 18 | Ht 60.0 in | Wt 160.2 lb

## 2018-06-08 VITALS — BP 185/71 | HR 50 | Temp 98.1°F | Resp 18 | Wt 157.8 lb

## 2018-06-08 DIAGNOSIS — Z79899 Other long term (current) drug therapy: Secondary | ICD-10-CM

## 2018-06-08 DIAGNOSIS — Z7901 Long term (current) use of anticoagulants: Secondary | ICD-10-CM | POA: Insufficient documentation

## 2018-06-08 DIAGNOSIS — Z794 Long term (current) use of insulin: Secondary | ICD-10-CM | POA: Diagnosis not present

## 2018-06-08 DIAGNOSIS — I509 Heart failure, unspecified: Secondary | ICD-10-CM | POA: Diagnosis not present

## 2018-06-08 DIAGNOSIS — F419 Anxiety disorder, unspecified: Secondary | ICD-10-CM

## 2018-06-08 DIAGNOSIS — Z905 Acquired absence of kidney: Secondary | ICD-10-CM | POA: Diagnosis not present

## 2018-06-08 DIAGNOSIS — N186 End stage renal disease: Secondary | ICD-10-CM | POA: Diagnosis not present

## 2018-06-08 DIAGNOSIS — R5383 Other fatigue: Secondary | ICD-10-CM | POA: Insufficient documentation

## 2018-06-08 DIAGNOSIS — C3491 Malignant neoplasm of unspecified part of right bronchus or lung: Secondary | ICD-10-CM

## 2018-06-08 DIAGNOSIS — Z923 Personal history of irradiation: Secondary | ICD-10-CM | POA: Diagnosis not present

## 2018-06-08 DIAGNOSIS — Z940 Kidney transplant status: Secondary | ICD-10-CM

## 2018-06-08 DIAGNOSIS — E785 Hyperlipidemia, unspecified: Secondary | ICD-10-CM

## 2018-06-08 DIAGNOSIS — Z08 Encounter for follow-up examination after completed treatment for malignant neoplasm: Secondary | ICD-10-CM | POA: Diagnosis not present

## 2018-06-08 DIAGNOSIS — I2510 Atherosclerotic heart disease of native coronary artery without angina pectoris: Secondary | ICD-10-CM | POA: Diagnosis not present

## 2018-06-08 DIAGNOSIS — I132 Hypertensive heart and chronic kidney disease with heart failure and with stage 5 chronic kidney disease, or end stage renal disease: Secondary | ICD-10-CM

## 2018-06-08 DIAGNOSIS — I700 Atherosclerosis of aorta: Secondary | ICD-10-CM | POA: Diagnosis not present

## 2018-06-08 DIAGNOSIS — Z8673 Personal history of transient ischemic attack (TIA), and cerebral infarction without residual deficits: Secondary | ICD-10-CM

## 2018-06-08 DIAGNOSIS — R918 Other nonspecific abnormal finding of lung field: Secondary | ICD-10-CM | POA: Diagnosis not present

## 2018-06-08 DIAGNOSIS — C3411 Malignant neoplasm of upper lobe, right bronchus or lung: Secondary | ICD-10-CM | POA: Diagnosis not present

## 2018-06-08 DIAGNOSIS — N183 Chronic kidney disease, stage 3 unspecified: Secondary | ICD-10-CM

## 2018-06-08 DIAGNOSIS — Z953 Presence of xenogenic heart valve: Secondary | ICD-10-CM

## 2018-06-08 DIAGNOSIS — R51 Headache: Secondary | ICD-10-CM | POA: Insufficient documentation

## 2018-06-08 DIAGNOSIS — Z85118 Personal history of other malignant neoplasm of bronchus and lung: Secondary | ICD-10-CM | POA: Diagnosis not present

## 2018-06-08 DIAGNOSIS — E1122 Type 2 diabetes mellitus with diabetic chronic kidney disease: Secondary | ICD-10-CM

## 2018-06-08 DIAGNOSIS — K219 Gastro-esophageal reflux disease without esophagitis: Secondary | ICD-10-CM

## 2018-06-08 DIAGNOSIS — C3490 Malignant neoplasm of unspecified part of unspecified bronchus or lung: Secondary | ICD-10-CM

## 2018-06-08 NOTE — Progress Notes (Signed)
[name] is here today for a follow-up appointment. Patient denies any pain,reports mild fatigue. Patient states that she gets shortness of breath with activity. Patient denies any difficulty with swallowing.Patient states that she coughs up clear phlegm .Patient denies any skin issues at her radiation site.Patients blood pressure was elevated today. Patient states that she has taken her blood medication today. Patient denies any chest pain or numbness or tingling in her extremities. Patient states that she has a mild headache. Vitals:   06/08/18 1159  BP: (!) 185/71  Pulse: (!) 50  Resp: 18  Temp: 98.1 F (36.7 C)  TempSrc: Oral  SpO2: 93%  Weight: 157 lb 12.8 oz (71.6 kg)   Wt Readings from Last 3 Encounters:  06/08/18 157 lb 12.8 oz (71.6 kg)  06/08/18 160 lb 3.2 oz (72.7 kg)  05/09/18 159 lb (72.1 kg)

## 2018-06-08 NOTE — Progress Notes (Signed)
Radiation Oncology         [phone] ________________________________  Name: [name] MRN: [id number]  Date: 06/08/2018  DOB: 06-21-1946  Follow-Up Visit Note  CC: Plotnikov, [name], MD  Rexene Alberts, MD    ICD-10-CM   1. 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  Most recent CT scan of the chest done on 06/06/18 showed evolving postradiation changes in the right upper lobe, now obscuring the treated nodule. Other previously noted small pulmonary nodules are stable compared to the prior examination. She reviewed these results with Dr. Julien Nordmann earlier today.               On review of systems, the patient denies any pain. She reports mild fatigue. She reports shortness of breath with activity. She has not needed to use oxygen in the past month. She denies any difficulty with swallowing. She states that she coughs up clear phlegm. She denies hemoptysis. She denies any skin issues at her radiation site. She denies any chest pain, numbness or tingling in her extremities. She states that she has a mild headache today.  ALLERGIES:  is allergic to ibuprofen; sulfamethoxazole-trimethoprim; sulfonamide derivatives; tape; tramadol; doxycycline; hydrocil [psyllium]; bactrim; and red dye.  Meds: Current Outpatient Medications  Medication Sig Dispense Refill  . amLODipine (NORVASC) 10 MG tablet Take 5 mg by mouth daily. TAKE 1/2 Tablet by mouth daily     . amLODipine (NORVASC) 10 MG tablet TAKE 1 TABLET BY MOUTH EVERY DAY 90 tablet 3  . dapsone 25 MG tablet Take 25 mg by mouth daily.     Marland Kitchen gabapentin (NEURONTIN) 100 MG capsule Take 100 mg by mouth 2 (two) times daily.     Marland Kitchen gabapentin  (NEURONTIN) 100 MG capsule TAKE 1 CAPSULE (100 MG TOTAL) BY MOUTH 3 (THREE) TIMES DAILY. 270 capsule 1  . glucose blood (ONETOUCH VERIO) test strip USE AS INSTRUCTED 4 TIMES A DAY (BEFORE MEALS) & AT BEDTIME (ICD 10-E11.9) 150 each 3  . glucose blood (ONETOUCH VERIO) test strip 1 each by Other route 3 (three) times daily as needed for other. Dx E11.9 150 each 5  . Insulin Glargine (TOUJEO SOLOSTAR) 300 UNIT/ML SOPN Inject 12 Units into the skin every morning. Titrate up by 1 unit a day for goal sugars of 100-130 up to 40 units a day 1 pen 11  . Insulin Pen Needle (B-D UF III MINI PEN NEEDLES) 31G X 5 MM MISC USE TO ADMINISTER INSULIN FOUR TIMES A DAY DX E11.9 100 each 1  . lisinopril (PRINIVIL,ZESTRIL) 5 MG tablet Take 5 mg by mouth at bedtime.     . metoprolol tartrate (LOPRESSOR) 25 MG tablet Take 0.5 tablets (12.5 mg total) by mouth 2 (two) times daily. TAKE 1 TABLET BY MOUTH TWICE A DAY 90 tablet 1  . Multiple Vitamins-Minerals (CENTRUM SILVER 50+WOMEN) TABS Take 1 tablet by mouth daily with breakfast.    . ONETOUCH DELICA LANCETS 93G MISC Use to check blood sugars three times a day DX E11.9 100 each 5  . predniSONE (DELTASONE) 5 MG tablet Take 5 mg by mouth daily.  5  . rosuvastatin (CRESTOR) 20 MG tablet Take 1 tablet (20  mg total) by mouth daily. 30 tablet 11  . tacrolimus (PROGRAF) 1 MG capsule Take 3 mg by mouth 2 (two) times daily. Take 3 tablets by mouth twice daily     . torsemide (DEMADEX) 20 MG tablet Take 1 tablet (20 mg total) by mouth 2 (two) times daily. 60 tablet 3  . warfarin (COUMADIN) 5 MG tablet Take 5-7.5 mg by mouth See admin instructions. 7.5 mg on Monday, 5 mg all other days    . warfarin (COUMADIN) 5 MG tablet Take 1/2 to 1 tablet by mouth daily as directed 90 tablet 1  . amoxicillin (AMOXIL) 500 MG capsule TAKE 4 CAPSULES BY MOUTH 1 HOUR PRIOR TO DENTAL APPOINTMENT (Patient not taking: Reported on 06/08/2018) 12 capsule 1  . levothyroxine (SYNTHROID, LEVOTHROID) 50 MCG  tablet Take 1 tablet (50 mcg total) by mouth daily before breakfast. 30 tablet 0  . oxyCODONE-acetaminophen (ROXICET) 5-325 MG tablet Take 0.5-1 tablets by mouth every 6 (six) hours as needed for severe pain. (Patient not taking: Reported on 06/08/2018) 100 tablet 0   No current facility-administered medications for this encounter.     Physical Findings: The patient is in no acute distress. Patient is alert and oriented.  weight is 157 lb 12.8 oz (71.6 kg). Her oral temperature is 98.1 F (36.7 C). Her blood pressure is 185/71 (abnormal) and her pulse is 50 (abnormal). Her respiration is 18 and oxygen saturation is 93%.   Lungs are clear to auscultation bilaterally. Heart has regular rate and rhythm. No palpable cervical, supraclavicular, or axillary adenopathy. Abdomen soft, non-tender, normal bowel sounds.  Lab Findings: Lab Results  Component Value Date   WBC 7.4 06/06/2018   HGB 11.5 (L) 06/06/2018   HCT 37.1 06/06/2018   MCV 78.3 (L) 06/06/2018   PLT 179 06/06/2018    Radiographic Findings: Ct Chest Wo Contrast  Result Date: 06/06/2018 CLINICAL DATA:  72 year old female with history of lung cancer status post radiation therapy (completed in June 2019). Shortness of breath on exertion. EXAM: CT CHEST WITHOUT CONTRAST TECHNIQUE: Multidetector CT imaging of the chest was performed following the standard protocol without IV contrast. COMPARISON:  Chest CT 10/11/2017. FINDINGS: Cardiovascular: Heart size is mildly enlarged. There is no significant pericardial fluid, thickening or pericardial calcification. There is aortic atherosclerosis, as well as atherosclerosis of the great vessels of the mediastinum and the coronary arteries, including calcified atherosclerotic plaque in the left main, left anterior descending, left circumflex and right coronary arteries. Status post median sternotomy for aortic valve replacement. Mediastinum/Nodes: No pathologically enlarged mediastinal or hilar lymph  nodes. Please note that accurate exclusion of hilar adenopathy is limited on noncontrast CT scans. Esophagus is unremarkable in appearance. No axillary lymphadenopathy. Lungs/Pleura: Previously noted right upper lobe nodule is now obscured by extensive surrounding ground-glass attenuation, septal thickening and areas of architectural distortion, compatible with evolving postradiation changes. 8 x 7 mm ground-glass attenuation lesion in the posterior aspect of the right upper lobe (axial image 48 of series 7) is stable compared to the prior study. 6 mm nodule in the superior segment of the left lower lobe (axial image 68 of series 7) is also stable compared to the prior examination. No other larger more suspicious appearing pulmonary nodules or masses are noted. Linear scarring in the posterior aspect of the right lower lobe is unchanged. No pleural effusions. Upper Abdomen: 2.0 x 1.3 cm low-attenuation (9 HU) right adrenal nodule, stable compared to prior studies, compatible with an adenoma. Severe atrophy of the  right kidney. 2.6 cm low-attenuation lesion in the posterior aspect of the interpolar region of the right kidney, incompletely characterized on today's noncontrast CT examination, but similar to the prior study and statistically likely a cyst. Musculoskeletal: Median sternotomy wires. There are no aggressive appearing lytic or blastic lesions noted in the visualized portions of the skeleton. IMPRESSION: 1. Evolving postradiation changes in the right upper lobe now obscuring the treated nodule. 2. Other previously noted small pulmonary nodules are stable compared to the prior examination. 3. Aortic atherosclerosis, in addition to left main and 3 vessel coronary artery disease. Assessment for potential risk factor modification, dietary therapy or pharmacologic therapy may be warranted, if clinically indicated. 4. Mild cardiomegaly. 5. 2.0 x 1.3 cm low-attenuation right adrenal nodule, stable compared to prior  studies, compatible with an adenoma. Aortic Atherosclerosis (ICD10-I70.0). Electronically Signed   By: Vinnie Langton M.D.   On: 06/06/2018 15:26   Mm 3d Screen Breast Bilateral  Result Date: 05/24/2018 CLINICAL DATA:  Screening. EXAM: DIGITAL SCREENING BILATERAL MAMMOGRAM WITH TOMO AND CAD COMPARISON:  Previous exam(s). ACR Breast Density Category b: There are scattered areas of fibroglandular density. FINDINGS: There are no findings suspicious for malignancy. Images were processed with CAD. IMPRESSION: No mammographic evidence of malignancy. A result letter of this screening mammogram will be mailed directly to the patient. RECOMMENDATION: Screening mammogram in one year. (Code:[id number]) BI-RADS CATEGORY  1: Negative. Electronically Signed   By: Lajean Manes M.D.   On: 05/24/2018 13:00    Impression:  Clinically stable with good results on chest CT scan earlier this week.  Plan:  Follow up with chest CT scan in 6 months. Dr. Julien Nordmann to schedule and hopefully we will see the patient on the same day as medical oncology sees the patient.  ____________________________________  Blair Promise, PhD, MD  This document serves as a record of services personally performed by Gery Pray, MD. It was created on his behalf by Rae Lips, a trained medical scribe. The creation of this record is based on the scribe's personal observations and the provider's statements to them. This document has been checked and approved by the attending provider.

## 2018-06-08 NOTE — Progress Notes (Signed)
[place] Telephone:([phone]) [phone]   [phone]) [phone]  OFFICE PROGRESS NOTE  Plotnikov, [name], MD [place] [address]  DIAGNOSIS: Stage IA (T1b, N0, M0) non-small cell lung cancer, well-differentiated adenocarcinoma with biopsy-proven right upper lobe pulmonary nodule diagnosed in May 2019.  The patient also has 2 other suspicious nodule in the right upper lobe and left lower lobe but they are too small to characterize at this point and they could represent multifocal disease.  PRIOR THERAPY: Curative stereotactic radiotherapy to the right upper lobe pulmonary nodule under the care of Dr. Sondra Come completed on December 29, 2017.  CURRENT THERAPY: Observation.  INTERVAL HISTORY: [name] 71 y.o. female returns to the clinic today for follow-up visit.  The patient is feeling fine today with no specific complaints except for anxiety about her scan results.  She denied having any chest pain, shortness of breath, cough or hemoptysis.  She denied having any fever or chills.  She has no nausea, vomiting, diarrhea or constipation.  She had repeat CT scan of the chest performed recently and she is here for evaluation and discussion of her risk her results.  MEDICAL HISTORY: Past Medical History:  Diagnosis Date  . Adenocarcinoma of lung, stage 1, right ([place]) 11/25/2017  . Anemia, iron deficiency    of chronic disease  . Aortic stenosis    a. Severe AS by echo 11/2012.  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 Asystole ([place])    a. During ENT surgery 2005: developed marked asystole requiring CPR, felt due to vagal reaction (cath nonobst dz).  . Carotid artery disease ([place])    a. Carotid Dopplers performed in August 2013 showed 40-59% left stenosis and 0-39% right; f/u recommended in 2 years.   . Cerebrovascular accident [facility]) 2009   a. LMCA infarct felt embolic 7341, maintained on chronic coumadin.; denies residual on 04/05/2013  . CHF  (congestive heart failure) ([place])   . Cholelithiasis   . Chronic cough onset 03/2010   Dr Melvyn Novas  . Chronic Persistent Atrial Fibrillation 12/31/2008   Qualifier: Diagnosis of  By: [name]    . Coronary artery disease 05/2002   a. Ant MI 2003 s/p PTCA/stent to RCA.   . Diverticulosis of colon   . Esophagitis, reflux   . ESRD (end stage renal disease) ([place])    a. Mass on L kidney per pt s/p nephrectomy - pt states not cancer - [facility] notes indicate ESRD due to HTN/DM - was previously on HD. b. Kidney transplant 02/2011.  Marland Kitchen GERD (gastroesophageal reflux disease)   . Gout   . Helicobacter pylori (H. pylori) infection    hx of  . Hemorrhoids   . Hx of colonic polyps    adenomatous  . Hyperlipidemia   . Hypertension   . Lung nodule seen on imaging study 04/07/2013   1.0 cm ground glass opacity RUL  . Myocardial infarction ([place]) 2003  . Pericardial effusion    a. Small by echo 11/2011.  Marland Kitchen Pruritic dermatitis    treated with steriods/UV light  . S/P aortic valve replacement with bioprosthetic valve and maze procedure 04/12/2013   7mm [facility] Ease bovine pericardial tissue valve   . S/P Maze operation for atrial fibrillation 04/12/2013   Complete bilateral atrial lesion set using bipolar radiofrequency and cryothermy ablation with clipping of LA appendage  . Sleep apnea    Pt says testing was positive, intolerant  of CPAP.  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  . Type II diabetes mellitus ([place])   . Unspecified hearing loss     ALLERGIES:  is allergic to ibuprofen; sulfamethoxazole-trimethoprim; sulfonamide derivatives; tape; tramadol; doxycycline; hydrocil [psyllium]; bactrim; and red dye.  MEDICATIONS:  Current Outpatient Medications  Medication Sig Dispense Refill  . amLODipine (NORVASC) 10 MG tablet Take 5 mg by  mouth daily. TAKE 1/2 Tablet by mouth daily     . amLODipine (NORVASC) 10 MG tablet TAKE 1 TABLET BY MOUTH EVERY DAY 90 tablet 3  . amoxicillin (AMOXIL) 500 MG capsule TAKE 4 CAPSULES BY MOUTH 1 HOUR PRIOR TO DENTAL APPOINTMENT 12 capsule 1  . dapsone 25 MG tablet Take 25 mg by mouth daily.     Marland Kitchen gabapentin (NEURONTIN) 100 MG capsule Take 100 mg by mouth 2 (two) times daily.     Marland Kitchen gabapentin (NEURONTIN) 100 MG capsule TAKE 1 CAPSULE (100 MG TOTAL) BY MOUTH 3 (THREE) TIMES DAILY. 270 capsule 1  . glucose blood (ONETOUCH VERIO) test strip USE AS INSTRUCTED 4 TIMES A DAY (BEFORE MEALS) & AT BEDTIME (ICD 10-E11.9) 150 each 3  . glucose blood (ONETOUCH VERIO) test strip 1 each by Other route 3 (three) times daily as needed for other. Dx E11.9 150 each 5  . Insulin Glargine (TOUJEO SOLOSTAR) 300 UNIT/ML SOPN Inject 12 Units into the skin every morning. Titrate up by 1 unit a day for goal sugars of 100-130 up to 40 units a day 1 pen 11  . Insulin Pen Needle (B-D UF III MINI PEN NEEDLES) 31G X 5 MM MISC USE TO ADMINISTER INSULIN FOUR TIMES A DAY DX E11.9 100 each 1  . levothyroxine (SYNTHROID, LEVOTHROID) 50 MCG tablet Take 1 tablet (50 mcg total) by mouth daily before breakfast. 30 tablet 0  . lisinopril (PRINIVIL,ZESTRIL) 5 MG tablet Take 5 mg by mouth at bedtime.     . metoprolol tartrate (LOPRESSOR) 25 MG tablet Take 0.5 tablets (12.5 mg total) by mouth 2 (two) times daily. TAKE 1 TABLET BY MOUTH TWICE A DAY 90 tablet 1  . Multiple Vitamins-Minerals (CENTRUM SILVER 50+WOMEN) TABS Take 1 tablet by mouth daily with breakfast.    . ONETOUCH DELICA LANCETS 54O MISC Use to check blood sugars three times a day DX E11.9 100 each 5  . oxyCODONE-acetaminophen (ROXICET) 5-325 MG tablet Take 0.5-1 tablets by mouth every 6 (six) hours as needed for severe pain. 100 tablet 0  . predniSONE (DELTASONE) 5 MG tablet Take 5 mg by mouth daily.  5  . rosuvastatin (CRESTOR) 20 MG tablet Take 1 tablet (20 mg total) by mouth  daily. 30 tablet 11  . tacrolimus (PROGRAF) 1 MG capsule Take 3 mg by mouth 2 (two) times daily. Take 3 tablets by mouth twice daily     . torsemide (DEMADEX) 20 MG tablet Take 1 tablet (20 mg total) by mouth 2 (two) times daily. 60 tablet 3  . warfarin (COUMADIN) 5 MG tablet Take 5-7.5 mg by mouth See admin instructions. 7.5 mg on Monday, 5 mg all other days    . warfarin (COUMADIN) 5 MG tablet Take 1/2 to 1 tablet by mouth daily as directed 90 tablet 1   No current facility-administered medications for this visit.     SURGICAL HISTORY:  Past Surgical History:  Procedure Laterality Date  .  AORTIC VALVE REPLACEMENT N/A 04/12/2013   Procedure: AORTIC VALVE REPLACEMENT (AVR);  Surgeon: Rexene Alberts, MD;  Location: [place];  Service: Open Heart Surgery;  Laterality: N/A;  . ARTERIOVENOUS GRAFT PLACEMENT Left   . ARTERIOVENOUS GRAFT PLACEMENT Left    "I've had 2 on my left; had one removed" (04/05/2013)   . ARTERY EXPLORATION Right 04/11/2013   Procedure: ARTERY EXPLORATION;  Surgeon: Rexene Alberts, MD;  Location: [place];  Service: Open Heart Surgery;  Laterality: Right;  Right carotid artery exploration  . AV FISTULA PLACEMENT Right   . AV FISTULA REPAIR Right    "took it out" ((/18/2014)  . CARDIOVERSION  05/29/2012   Procedure: CARDIOVERSION;  Surgeon: Lelon Perla, MD;  Location: [facility] ENDOSCOPY;  Service: Cardiovascular;  Laterality: N/A;  . CHOLECYSTECTOMY  2009   with hernia removal  . CORONARY ANGIOPLASTY WITH STENT PLACEMENT Right    coronary artery  . INSERTION OF DIALYSIS CATHETER Bilateral    "over the years; took them both out" (04/05/2013)  . INTRAOPERATIVE TRANSESOPHAGEAL ECHOCARDIOGRAM N/A 04/11/2013   Procedure: INTRAOPERATIVE TRANSESOPHAGEAL ECHOCARDIOGRAM;  Surgeon: Rexene Alberts, MD;  Location: [place];  Service: Open Heart Surgery;  Laterality: N/A;  . INTRAOPERATIVE TRANSESOPHAGEAL ECHOCARDIOGRAM N/A 04/12/2013   Procedure: INTRAOPERATIVE TRANSESOPHAGEAL  ECHOCARDIOGRAM;  Surgeon: Rexene Alberts, MD;  Location: [place];  Service: Open Heart Surgery;  Laterality: N/A;  . KIDNEY TRANSPLANT  03/16/11  . LEFT AND RIGHT HEART CATHETERIZATION WITH CORONARY ANGIOGRAM N/A 04/06/2013   Procedure: LEFT AND RIGHT HEART CATHETERIZATION WITH CORONARY ANGIOGRAM;  Surgeon: Blane Ohara, MD;  [facility] C[facility] LAB;  Service: Cardiovascular;  Laterality: N/A;  . MAZE N/A 04/12/2013   Procedure: MAZE;  Surgeon: Rexene Alberts, MD;  Location: [place];  Service: Open Heart Surgery;  Laterality: N/A;  . NASAL RECONSTRUCTION WITH SEPTAL REPAIR     "took it out" (04/05/2013)  . NEPHRECTOMY Left 2010   no CA on bx  . TONSILLECTOMY    . TOTAL ABDOMINAL HYSTERECTOMY    . TUBAL LIGATION      REVIEW OF SYSTEMS:  A comprehensive review of systems was negative.   PHYSICAL EXAMINATION: General appearance: alert, cooperative and no distress Head: Normocephalic, without obvious abnormality, atraumatic Neck: no adenopathy, no JVD, supple, symmetrical, trachea midline and thyroid not enlarged, symmetric, no tenderness/mass/nodules Lymph nodes: Cervical, supraclavicular, and axillary nodes normal. Resp: clear to auscultation bilaterally Back: symmetric, no curvature. ROM normal. No CVA tenderness. Cardio: regular rate and rhythm, S1, S2 normal, no murmur, click, rub or gallop GI: soft, non-tender; bowel sounds normal; no masses,  no organomegaly Extremities: extremities normal, atraumatic, no cyanosis or edema  ECOG PERFORMANCE STATUS: 1 - Symptomatic but completely ambulatory  Blood pressure (!) 182/60, pulse (!) 53, temperature 98.3 F (36.8 C), temperature source Oral, resp. rate 18, height 5' (1.524 m), weight 160 lb 3.2 oz (72.7 kg), SpO2 97 %.  LABORATORY DATA: Lab Results  Component Value Date   WBC 7.4 06/06/2018   HGB 11.5 (L) 06/06/2018   HCT 37.1 06/06/2018   MCV 78.3 (L) 06/06/2018   PLT 179 06/06/2018      Chemistry      Component Value Date/Time     NA 142 06/06/2018 1038   K 3.7 06/06/2018 1038   CL 104 06/06/2018 1038   CO2 28 06/06/2018 1038   BUN 42 (H) 06/06/2018 1038   CREATININE 2.25 (H) 06/06/2018 1038   CREATININE 1.35 (H) 03/30/2013 1634  Component Value Date/Time   CALCIUM 10.4 (H) 06/06/2018 1038   CALCIUM 9.7 01/29/2008 1338   ALKPHOS 105 06/06/2018 1038   AST 18 06/06/2018 1038   ALT 18 06/06/2018 1038   BILITOT 0.5 06/06/2018 1038       RADIOGRAPHIC STUDIES: Ct Chest Wo Contrast  Result Date: 06/06/2018 CLINICAL DATA:  72 year old female with history of lung cancer status post radiation therapy (completed in June 2019). Shortness of breath on exertion. EXAM: CT CHEST WITHOUT CONTRAST TECHNIQUE: Multidetector CT imaging of the chest was performed following the standard protocol without IV contrast. COMPARISON:  Chest CT 10/11/2017. FINDINGS: Cardiovascular: Heart size is mildly enlarged. There is no significant pericardial fluid, thickening or pericardial calcification. There is aortic atherosclerosis, as well as atherosclerosis of the great vessels of the mediastinum and the coronary arteries, including calcified atherosclerotic plaque in the left main, left anterior descending, left circumflex and right coronary arteries. Status post median sternotomy for aortic valve replacement. Mediastinum/Nodes: No pathologically enlarged mediastinal or hilar lymph nodes. Please note that accurate exclusion of hilar adenopathy is limited on noncontrast CT scans. Esophagus is unremarkable in appearance. No axillary lymphadenopathy. Lungs/Pleura: Previously noted right upper lobe nodule is now obscured by extensive surrounding ground-glass attenuation, septal thickening and areas of architectural distortion, compatible with evolving postradiation changes. 8 x 7 mm ground-glass attenuation lesion in the posterior aspect of the right upper lobe (axial image 48 of series 7) is stable compared to the prior study. 6 mm nodule in the  superior segment of the left lower lobe (axial image 68 of series 7) is also stable compared to the prior examination. No other larger more suspicious appearing pulmonary nodules or masses are noted. Linear scarring in the posterior aspect of the right lower lobe is unchanged. No pleural effusions. Upper Abdomen: 2.0 x 1.3 cm low-attenuation (9 HU) right adrenal nodule, stable compared to prior studies, compatible with an adenoma. Severe atrophy of the right kidney. 2.6 cm low-attenuation lesion in the posterior aspect of the interpolar region of the right kidney, incompletely characterized on today's noncontrast CT examination, but similar to the prior study and statistically likely a cyst. Musculoskeletal: Median sternotomy wires. There are no aggressive appearing lytic or blastic lesions noted in the visualized portions of the skeleton. IMPRESSION: 1. Evolving postradiation changes in the right upper lobe now obscuring the treated nodule. 2. Other previously noted small pulmonary nodules are stable compared to the prior examination. 3. Aortic atherosclerosis, in addition to left main and 3 vessel coronary artery disease. Assessment for potential risk factor modification, dietary therapy or pharmacologic therapy may be warranted, if clinically indicated. 4. Mild cardiomegaly. 5. 2.0 x 1.3 cm low-attenuation right adrenal nodule, stable compared to prior studies, compatible with an adenoma. Aortic Atherosclerosis (ICD10-I70.0). Electronically Signed   By: Vinnie Langton M.D.   On: 06/06/2018 15:26   Mm 3d Screen Breast Bilateral  Result Date: 05/24/2018 CLINICAL DATA:  Screening. EXAM: DIGITAL SCREENING BILATERAL MAMMOGRAM WITH TOMO AND CAD COMPARISON:  Previous exam(s). ACR Breast Density Category b: There are scattered areas of fibroglandular density. FINDINGS: There are no findings suspicious for malignancy. Images were processed with CAD. IMPRESSION: No mammographic evidence of malignancy. A result letter  of this screening mammogram will be mailed directly to the patient. RECOMMENDATION: Screening mammogram in one year. (Code:[id number]-B-01Y) BI-RADS CATEGORY  1: Negative. Electronically Signed   By: Lajean Manes M.D.   On: 05/24/2018 13:00    ASSESSMENT AND PLAN: This is a very pleasant 72  years old African-American female with history of stage IA non-small cell lung cancer status post curative stereotactic radiotherapy to the right upper lobe lung nodule in June 2019. The patient is currently on observation and she is feeling fine with no concerning complaints. Repeat CT scan of the chest showed no concerning findings for disease progression. I discussed the scan results with the patient today and recommended for her to continue on observation with repeat CT scan of the chest in 6 months. For hypertension she was advised to take her blood pressure medication as prescribed and to discuss with her primary care physician for adjustment of her medication. She was advised to call immediately if she has any concerning symptoms in the interval. The patient voices understanding of current disease status and treatment options and is in agreement with the current care plan.  All questions were answered. The patient knows to call the clinic with any problems, questions or concerns. We can certainly see the patient much sooner if necessary.  I spent 10 minutes counseling the patient face to face. The total time spent in the appointment was 15 minutes.  Disclaimer: This note was dictated with voice recognition software. Similar sounding words can inadvertently be transcribed and may not be corrected upon review.

## 2018-06-09 ENCOUNTER — Ambulatory Visit (HOSPITAL_COMMUNITY): Payer: Medicare Other

## 2018-06-09 ENCOUNTER — Telehealth: Payer: Self-pay | Admitting: Internal Medicine

## 2018-06-09 NOTE — Telephone Encounter (Signed)
Scheduled appt per 11/21 los - reminder letter sent in the mail with appt date and time

## 2018-06-14 ENCOUNTER — Ambulatory Visit (INDEPENDENT_AMBULATORY_CARE_PROVIDER_SITE_OTHER): Payer: Medicare Other | Admitting: Pharmacist

## 2018-06-14 DIAGNOSIS — Z5181 Encounter for therapeutic drug level monitoring: Secondary | ICD-10-CM

## 2018-06-14 DIAGNOSIS — I4891 Unspecified atrial fibrillation: Secondary | ICD-10-CM | POA: Diagnosis not present

## 2018-06-14 LAB — POCT INR: INR: 3 (ref 2.0–3.0)

## 2018-07-06 DIAGNOSIS — R809 Proteinuria, unspecified: Secondary | ICD-10-CM | POA: Diagnosis not present

## 2018-07-06 DIAGNOSIS — Z940 Kidney transplant status: Secondary | ICD-10-CM | POA: Diagnosis not present

## 2018-07-06 DIAGNOSIS — I10 Essential (primary) hypertension: Secondary | ICD-10-CM | POA: Diagnosis not present

## 2018-07-06 DIAGNOSIS — E119 Type 2 diabetes mellitus without complications: Secondary | ICD-10-CM | POA: Diagnosis not present

## 2018-07-06 DIAGNOSIS — D899 Disorder involving the immune mechanism, unspecified: Secondary | ICD-10-CM | POA: Diagnosis not present

## 2018-07-06 DIAGNOSIS — R600 Localized edema: Secondary | ICD-10-CM | POA: Diagnosis not present

## 2018-07-10 ENCOUNTER — Ambulatory Visit: Payer: Medicare Other | Admitting: Internal Medicine

## 2018-07-18 ENCOUNTER — Ambulatory Visit: Payer: Medicare Other | Admitting: Internal Medicine

## 2018-08-02 ENCOUNTER — Telehealth: Payer: Self-pay

## 2018-08-02 NOTE — Telephone Encounter (Signed)
Called to reschedule pt for overdue inr pt was compliant to come at 1/24 @330 

## 2018-08-03 ENCOUNTER — Other Ambulatory Visit (HOSPITAL_COMMUNITY): Payer: Self-pay | Admitting: Nurse Practitioner

## 2018-08-07 ENCOUNTER — Other Ambulatory Visit: Payer: Self-pay | Admitting: *Deleted

## 2018-08-07 ENCOUNTER — Ambulatory Visit: Payer: Medicare Other | Admitting: Internal Medicine

## 2018-08-07 DIAGNOSIS — Z0289 Encounter for other administrative examinations: Secondary | ICD-10-CM

## 2018-08-07 NOTE — Patient Outreach (Signed)
[place] [facility]) Care Management  08/07/2018  Johna Kearl 03-10-1946 [id number]   RN Health Coach Every other Month Outreach  Referral Date:01/12/2017 Referral Source:[facility] RN Case Manager Reason for Referral:CHF education Insurance:Medicare   Outreach Attempt:  Outreach attempt #1 to patient for follow up.  HIPAA compliant voice message left.  Patient returned call and requested a telephone call back.   Plan:  RN Health Coach will make another outreach attempt within the month of January.  [place] [phone] [email or web address]

## 2018-08-11 ENCOUNTER — Ambulatory Visit (INDEPENDENT_AMBULATORY_CARE_PROVIDER_SITE_OTHER): Payer: Medicare Other | Admitting: *Deleted

## 2018-08-11 ENCOUNTER — Other Ambulatory Visit: Payer: Self-pay | Admitting: *Deleted

## 2018-08-11 DIAGNOSIS — Z5181 Encounter for therapeutic drug level monitoring: Secondary | ICD-10-CM

## 2018-08-11 DIAGNOSIS — I4891 Unspecified atrial fibrillation: Secondary | ICD-10-CM | POA: Diagnosis not present

## 2018-08-11 LAB — POCT INR: INR: 1.5 — AB (ref 2.0–3.0)

## 2018-08-11 NOTE — Patient Instructions (Signed)
Description   Today take 1 tablet, tomorrow take 1.5 tablets, then continue taking 1 tablet daily except 1/2 tablet each Monday and Friday.  Repeat INR in 2 weeks

## 2018-08-11 NOTE — Patient Outreach (Signed)
[facility]) Care Management  08/11/2018  Ermine Stebbins [birth year] [id number]   RN Health Coach Every other Month Outreach  Referral Date:01/12/2017 Referral Source:[facility] RN Case Manager Reason for Referral:CHF education Insurance:Medicare   Outreach Attempt:  Outreach attempt #2 to patient for follow up. No answer. RN Health Coach left HIPAA compliant voicemail message along with contact information.  Plan:  RN Health Coach will make another outreach attempt within the month of February if no return call back from patient.  [place] [phone] [email or web address]

## 2018-08-21 DIAGNOSIS — Z940 Kidney transplant status: Secondary | ICD-10-CM | POA: Diagnosis not present

## 2018-08-21 DIAGNOSIS — E119 Type 2 diabetes mellitus without complications: Secondary | ICD-10-CM | POA: Diagnosis not present

## 2018-08-21 DIAGNOSIS — N2581 Secondary hyperparathyroidism of renal origin: Secondary | ICD-10-CM | POA: Diagnosis not present

## 2018-08-25 ENCOUNTER — Ambulatory Visit (INDEPENDENT_AMBULATORY_CARE_PROVIDER_SITE_OTHER): Payer: Medicare Other | Admitting: Pharmacist Clinician (PhC)/ Clinical Pharmacy Specialist

## 2018-08-25 DIAGNOSIS — E113291 Type 2 diabetes mellitus with mild nonproliferative diabetic retinopathy without macular edema, right eye: Secondary | ICD-10-CM | POA: Diagnosis not present

## 2018-08-25 DIAGNOSIS — I4891 Unspecified atrial fibrillation: Secondary | ICD-10-CM | POA: Diagnosis not present

## 2018-08-25 DIAGNOSIS — Z5181 Encounter for therapeutic drug level monitoring: Secondary | ICD-10-CM

## 2018-08-25 DIAGNOSIS — H04123 Dry eye syndrome of bilateral lacrimal glands: Secondary | ICD-10-CM | POA: Diagnosis not present

## 2018-08-25 DIAGNOSIS — E119 Type 2 diabetes mellitus without complications: Secondary | ICD-10-CM | POA: Diagnosis not present

## 2018-08-25 DIAGNOSIS — H524 Presbyopia: Secondary | ICD-10-CM | POA: Diagnosis not present

## 2018-08-25 DIAGNOSIS — H2513 Age-related nuclear cataract, bilateral: Secondary | ICD-10-CM | POA: Diagnosis not present

## 2018-08-25 LAB — POCT INR: INR: 2.4 (ref 2.0–3.0)

## 2018-08-28 ENCOUNTER — Ambulatory Visit (INDEPENDENT_AMBULATORY_CARE_PROVIDER_SITE_OTHER): Payer: Medicare Other | Admitting: Internal Medicine

## 2018-08-28 ENCOUNTER — Encounter: Payer: Self-pay | Admitting: Internal Medicine

## 2018-08-28 DIAGNOSIS — I5022 Chronic systolic (congestive) heart failure: Secondary | ICD-10-CM

## 2018-08-28 DIAGNOSIS — E1121 Type 2 diabetes mellitus with diabetic nephropathy: Secondary | ICD-10-CM | POA: Diagnosis not present

## 2018-08-28 DIAGNOSIS — R609 Edema, unspecified: Secondary | ICD-10-CM

## 2018-08-28 DIAGNOSIS — N189 Chronic kidney disease, unspecified: Secondary | ICD-10-CM | POA: Diagnosis not present

## 2018-08-28 DIAGNOSIS — Z794 Long term (current) use of insulin: Secondary | ICD-10-CM

## 2018-08-28 DIAGNOSIS — D631 Anemia in chronic kidney disease: Secondary | ICD-10-CM | POA: Diagnosis not present

## 2018-08-28 LAB — POCT GLYCOSYLATED HEMOGLOBIN (HGB A1C): HEMOGLOBIN A1C: 8.2 % — AB (ref 4.0–5.6)

## 2018-08-28 MED ORDER — INSULIN GLARGINE (1 UNIT DIAL) 300 UNIT/ML ~~LOC~~ SOPN: 24.0000 [IU] | PEN_INJECTOR | Freq: Every morning | SUBCUTANEOUS | 11 refills | Status: DC

## 2018-08-28 MED ORDER — AMLODIPINE BESYLATE 10 MG PO TABS: 5.0000 mg | ORAL_TABLET | Freq: Every day | ORAL | 3 refills | Status: DC

## 2018-08-28 NOTE — Assessment & Plan Note (Signed)
Chronic - CRF

## 2018-08-28 NOTE — Progress Notes (Signed)
Subjective:  [name], female    DOB: Jan 31, 1946  Age: 73 y.o. MRN: [id number]  CC: No chief complaint on file.   HPI [employer] presents for HTN, edema, CRF, CAD, DM   The pt just had labs   Outpatient Medications Prior to Visit  Medication Sig Dispense Refill  . amLODipine (NORVASC) 10 MG tablet TAKE 1 TABLET BY MOUTH EVERY DAY 90 tablet 3  . amoxicillin (AMOXIL) 500 MG capsule TAKE 4 CAPSULES BY MOUTH 1 HOUR PRIOR TO DENTAL APPOINTMENT 12 capsule 1  . dapsone 25 MG tablet Take 25 mg by mouth daily.     Marland Kitchen gabapentin (NEURONTIN) 100 MG capsule Take 100 mg by mouth 2 (two) times daily.     Marland Kitchen glucose blood (ONETOUCH VERIO) test strip 1 each by Other route 3 (three) times daily as needed for other. Dx E11.9 150 each 5  . Insulin Glargine (TOUJEO SOLOSTAR) 300 UNIT/ML SOPN Inject 12 Units into the skin every morning. Titrate up by 1 unit a day for goal sugars of 100-130 up to 40 units a day 1 pen 11  . Insulin Pen Needle (B-D UF III MINI PEN NEEDLES) 31G X 5 MM MISC USE TO ADMINISTER INSULIN FOUR TIMES A DAY DX E11.9 100 each 1  . lisinopril (PRINIVIL,ZESTRIL) 5 MG tablet Take 5 mg by mouth at bedtime.     . metoprolol tartrate (LOPRESSOR) 25 MG tablet TAKE 0.5 TABLETS (12.5 MG TOTAL) BY MOUTH 2 (TWO) TIMES DAILY. TAKE 1 TABLET BY MOUTH TWICE A DAY 180 tablet 3  . Multiple Vitamins-Minerals (CENTRUM SILVER 50+WOMEN) TABS Take 1 tablet by mouth daily with breakfast.    . ONETOUCH DELICA LANCETS 58N MISC Use to check blood sugars three times a day DX E11.9 100 each 5  . oxyCODONE-acetaminophen (ROXICET) 5-325 MG tablet Take 0.5-1 tablets by mouth every 6 (six) hours as needed for severe pain. 100 tablet 0  . predniSONE (DELTASONE) 5 MG tablet Take 5 mg by mouth daily.  5  . rosuvastatin (CRESTOR) 20 MG tablet Take 1 tablet (20 mg total) by mouth daily. 30 tablet 11  . tacrolimus (PROGRAF) 1 MG capsule Take 3 mg by mouth 2 (two) times daily. Take 3 tablets by mouth twice  daily     . torsemide (DEMADEX) 20 MG tablet Take 1 tablet (20 mg total) by mouth 2 (two) times daily. 60 tablet 3  . warfarin (COUMADIN) 5 MG tablet Take 1/2 to 1 tablet by mouth daily as directed 90 tablet 1  . levothyroxine (SYNTHROID, LEVOTHROID) 50 MCG tablet Take 1 tablet (50 mcg total) by mouth daily before breakfast. 30 tablet 0  . amLODipine (NORVASC) 10 MG tablet Take 5 mg by mouth daily. TAKE 1/2 Tablet by mouth daily     . gabapentin (NEURONTIN) 100 MG capsule TAKE 1 CAPSULE (100 MG TOTAL) BY MOUTH 3 (THREE) TIMES DAILY. 270 capsule 1  . glucose blood (ONETOUCH VERIO) test strip USE AS INSTRUCTED 4 TIMES A DAY (BEFORE MEALS) & AT BEDTIME (ICD 10-E11.9) 150 each 3  . warfarin (COUMADIN) 5 MG tablet Take 5-7.5 mg by mouth See admin instructions. 7.5 mg on Monday, 5 mg all other days     No facility-administered medications prior to visit.     ROS: Review of Systems  Constitutional: Positive for fatigue. Negative for activity change, appetite change, chills and unexpected weight change.  HENT: Negative for congestion, mouth sores and sinus pressure.   Eyes: Negative for  visual disturbance.  Respiratory: Positive for cough. Negative for chest tightness.   Cardiovascular: Positive for leg swelling. Negative for chest pain.  Gastrointestinal: Negative for abdominal pain and nausea.  Genitourinary: Negative for difficulty urinating, frequency and vaginal pain.  Musculoskeletal: Negative for back pain and gait problem.  Skin: Negative for pallor and rash.  Neurological: Negative for dizziness, tremors, weakness, numbness and headaches.  Hematological: Bruises/bleeds easily.  Psychiatric/Behavioral: Negative for confusion, sleep disturbance and suicidal ideas. The patient is nervous/anxious.     Objective:  BP 128/74 (BP Location: Right Arm, Patient Position: Sitting, Cuff Size: Large)   Pulse (!) 102   Temp 98.4 F (36.9 C) (Oral)   Ht 5' (1.524 m)   Wt 162 lb (73.5 kg)   SpO2  (!) 86%   BMI 31.64 kg/m   BP Readings from Last 3 Encounters:  08/28/18 128/74  06/08/18 (!) 185/71  06/08/18 (!) 182/60    Wt Readings from Last 3 Encounters:  08/28/18 162 lb (73.5 kg)  06/08/18 157 lb 12.8 oz (71.6 kg)  06/08/18 160 lb 3.2 oz (72.7 kg)    Physical Exam Constitutional:      General: She is not in acute distress.    Appearance: She is well-developed.  HENT:     Head: Normocephalic.     Right Ear: External ear normal.     Left Ear: External ear normal.     Nose: Nose normal.  Eyes:     General:        Right eye: No discharge.        Left eye: No discharge.     Conjunctiva/sclera: Conjunctivae normal.     Pupils: Pupils are equal, round, and reactive to light.  Neck:     Musculoskeletal: Normal range of motion and neck supple.     Thyroid: No thyromegaly.     Vascular: No JVD.     Trachea: No tracheal deviation.  Cardiovascular:     Rate and Rhythm: Normal rate and regular rhythm.     Heart sounds: Normal heart sounds.  Pulmonary:     Effort: No respiratory distress.     Breath sounds: No stridor. No wheezing.  Abdominal:     General: Bowel sounds are normal. There is no distension.     Palpations: Abdomen is soft. There is no mass.     Tenderness: There is no abdominal tenderness. There is no guarding or rebound.  Musculoskeletal:        General: Swelling and tenderness present.  Lymphadenopathy:     Cervical: No cervical adenopathy.  Skin:    Findings: No erythema or rash.  Neurological:     Cranial Nerves: No cranial nerve deficit.     Motor: Weakness present. No abnormal muscle tone.     Coordination: Coordination abnormal.     Gait: Gait abnormal.     Deep Tendon Reflexes: Reflexes normal.  Psychiatric:        Behavior: Behavior normal.        Thought Content: Thought content normal.        Judgment: Judgment normal.    LE swelling R>L NT  Cane   Lab Results  Component Value Date   WBC 7.4 06/06/2018   HGB 11.5 (L)  06/06/2018   HCT 37.1 06/06/2018   PLT 179 06/06/2018   GLUCOSE 177 (H) 06/06/2018   CHOL 155 03/24/2018   TRIG 91 03/24/2018   HDL 64 03/24/2018   LDLDIRECT 89.3 06/13/2006   LDLCALC  73 03/24/2018   ALT 18 06/06/2018   AST 18 06/06/2018   NA 142 06/06/2018   K 3.7 06/06/2018   CL 104 06/06/2018   CREATININE 2.25 (H) 06/06/2018   BUN 42 (H) 06/06/2018   CO2 28 06/06/2018   TSH 2.93 09/02/2017   INR 2.4 08/25/2018   HGBA1C 8.7 (H) 02/04/2018   MICROALBUR 317.3 (H) 07/02/2016    No results found.  Assessment & Plan:   There are no diagnoses linked to this encounter.   No orders of the defined types were placed in this encounter.    Follow-up: No follow-ups on file.  Walker Kehr, MD

## 2018-08-28 NOTE — Assessment & Plan Note (Signed)
Metoprolol, Furosemide

## 2018-08-28 NOTE — Assessment & Plan Note (Signed)
Humalog SS  Toujeo

## 2018-08-28 NOTE — Assessment & Plan Note (Signed)
Reduce Norvasc to 5 mg/d R>L

## 2018-09-04 ENCOUNTER — Other Ambulatory Visit: Payer: Self-pay | Admitting: *Deleted

## 2018-09-04 NOTE — Patient Outreach (Signed)
[facility]) Care Management  09/04/2018  [name] 1946-05-26 [id number]   RN Health Coach Every other Month Outreach  Referral Date:01/12/2017 Referral Source:[facility] RN Case Manager Reason for Referral:CHF education Insurance:Medicare   Outreach Attempt:  Outreach attempt #3 to patient for follow up.  Patient answered and stated she was headed out the door.  Requested another call back.   Plan:  RN Health Coach will make another outreach attempt within the month of February.  Index [phone] [email or web address]

## 2018-09-12 ENCOUNTER — Encounter: Payer: Self-pay | Admitting: *Deleted

## 2018-09-12 ENCOUNTER — Other Ambulatory Visit: Payer: Self-pay | Admitting: *Deleted

## 2018-09-12 DIAGNOSIS — Z8669 Personal history of other diseases of the nervous system and sense organs: Secondary | ICD-10-CM | POA: Insufficient documentation

## 2018-09-12 DIAGNOSIS — Z8673 Personal history of transient ischemic attack (TIA), and cerebral infarction without residual deficits: Secondary | ICD-10-CM | POA: Insufficient documentation

## 2018-09-12 NOTE — Patient Outreach (Signed)
[name]) Care Management  [facility] Care Manager  09/12/2018   [name] 05-Nov-1945 [id number]   [place] Every other Month Outreach   Referral Date: 01/12/2017 Referral Source: [facility] RN Case Manager Reason for Referral: CHF education Insurance: Medicare   Outreach Attempt:  Successful telephone outreach to patient for follow up.  HIPAA verified with patient. Patient reporting she is doing better.  States her granddaughter was killed in [place] during the holidays, but she is dealing with the stress at lot better at this time.  Continues to weigh daily.  Weight this morning was 157 pounds (in normal range).  Does report recent swelling in her right leg and left arm.  States she was instructed by her primary care provider to take her torsemide 2-3 times a week.  Reporting some residual swelling in her left leg/foot.  Denies any shortness of breath and states she wears her oxygen only as needed.  Fasting blood sugar this morning was 79 with fasting ranges of 80-150's.  Patient stating she drank some lemonade this morning to help with low blood sugar.  Latest Hgb A1C was 8.2 on 08/28/2018.  Continues to care for her great grandson and is requesting information to obtain guardianship and enroll in [employer].  Discussed [place] [facility] and patient verbally agrees.  Encounter Medications:  Outpatient Encounter Medications as of 09/12/2018  Medication Sig Note  . amLODipine (NORVASC) 10 MG tablet Take 0.5 tablets (5 mg total) by mouth daily.   Marland Kitchen amoxicillin (AMOXIL) 500 MG capsule TAKE 4 CAPSULES BY MOUTH 1 HOUR PRIOR TO DENTAL APPOINTMENT   . dapsone 25 MG tablet Take 25 mg by mouth daily.    Marland Kitchen gabapentin (NEURONTIN) 100 MG capsule Take 100 mg by mouth 2 (two) times daily.  05/22/2018: Reports taking once a day mostly and twice a day when she feels she needs to  . glucose blood (ONETOUCH VERIO) test strip 1 each by Other route 3 (three) times daily as needed for  other. Dx E11.9   . Insulin Glargine, 1 Unit Dial, (TOUJEO SOLOSTAR) 300 UNIT/ML SOPN Inject 24 Units into the skin every morning. Titrate up by 1 unit a day for goal sugars of 100-130 up to 50 units a day   . Insulin Pen Needle (B-D UF III MINI PEN NEEDLES) 31G X 5 MM MISC USE TO ADMINISTER INSULIN FOUR TIMES A DAY DX E11.9   . lisinopril (PRINIVIL,ZESTRIL) 5 MG tablet Take 5 mg by mouth at bedtime.    . metoprolol tartrate (LOPRESSOR) 25 MG tablet TAKE 0.5 TABLETS (12.5 MG TOTAL) BY MOUTH 2 (TWO) TIMES DAILY. TAKE 1 TABLET BY MOUTH TWICE A DAY   . Multiple Vitamins-Minerals (CENTRUM SILVER 50+WOMEN) TABS Take 1 tablet by mouth daily with breakfast.   . ONETOUCH DELICA LANCETS 27C MISC Use to check blood sugars three times a day DX E11.9   . oxyCODONE-acetaminophen (ROXICET) 5-325 MG tablet Take 0.5-1 tablets by mouth every 6 (six) hours as needed for severe pain.   . predniSONE (DELTASONE) 5 MG tablet Take 5 mg by mouth daily.   . rosuvastatin (CRESTOR) 20 MG tablet Take 1 tablet (20 mg total) by mouth daily.   . tacrolimus (PROGRAF) 1 MG capsule Take 3 mg by mouth 2 (two) times daily. Take 3 tablets by mouth twice daily    . torsemide (DEMADEX) 20 MG tablet Take 1 tablet (20 mg total) by mouth 2 (two) times daily. 09/12/2018: Reports being instructed to take  2-3 times a week  . warfarin (COUMADIN) 5 MG tablet Take 1/2 to 1 tablet by mouth daily as directed   . levothyroxine (SYNTHROID, LEVOTHROID) 50 MCG tablet Take 1 tablet (50 mcg total) by mouth daily before breakfast. 05/22/2018: Reports continues to take   No facility-administered encounter medications on file as of 09/12/2018.     Functional Status:  In your present state of health, do you have any difficulty performing the following activities: 09/12/2018 04/03/2018  Hearing? 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 N N  Difficulty concentrating or making decisions? N N  Walking or climbing stairs? [name]  Comment  trouble walking long distances -  Dressing or bathing? N N  Doing errands, shopping? N N  Preparing Food and eating ? N N  Using the Toilet? N N  In the past six months, have you accidently leaked urine? Y Y  Comment taking fluid medication -  Do you have problems with loss of bowel control? N N  Managing your Medications? N N  Managing your Finances? N N  Housekeeping or managing your Housekeeping? N Y  Some recent data might be hidden    Fall/Depression Screening: Fall Risk  09/12/2018 05/22/2018 02/09/2018  Falls in the past year? 1 1 No  Comment no falls in the past 9 months no falls in the last 6 months -  Number falls in past yr: 1 1 -  Comment - - -  Injury with Fall? 0 0 -  Risk for fall due to : History of fall(s);Impaired vision;Medication side effect;Impaired balance/gait;Impaired mobility History of fall(s);Impaired balance/gait;Medication side effect;Impaired mobility;Impaired vision -  Follow up Falls evaluation completed;Falls prevention discussed;Education provided Falls prevention discussed;Education provided -   [facility] 2/9 Scores 09/12/2018 10/06/2017 05/05/2017 01/21/2017 01/12/2017 11/21/2015 11/11/2014  PHQ - 2 Score 0 0 0 0 0 0 0   [facility] CM Care Plan Problem One     Most Recent Value  Care Plan Problem One  Knowledge deficiet related to self care management of diabetes and heart failure  Role Documenting the Problem One  [place] for Problem One  Active  [place] will report a resolution of edema in right leg in the next 60 days.  [facility] Long Term Goal Start Date  09/12/18  [facility] Long Term Goal Met Date  09/12/18  Interventions for Problem One Long Term Goal  Reviewed and discussed current care plan and goals with patient, [facility] Social Work referral placed to assist with legal afairs with great grandson, encouraged to continue to weigh daily, reviewed medications and encouraged medication compliance, encouraged patient to elevate extremities,  discussed and encouraged low sodium food and drink options, encouraged to keep and attend medical appointments  [facility] CM Short Term Goal #1   Patient will report a decrease in Hgb A1C by 0.2 points in the next 90 days.  [facility] CM Short Term Goal #1 Start Date  09/12/18  [facility] CM Short Term Goal #1 Met Date  09/12/18  Interventions for Short Term Goal #1  Congratulated patient on reduction of Hgb A1C to 8.2, discussed and congratulated patient on current glucose ranges, reviewed and discussed signs and symptoms of hypo and hyperglycemia, discussed treatment of hypoglycemia, reviewed and encouraged diabetic low sodium diet option  [facility] CM Short Term Goal #2   Patient will report taking insulin daily in the next 60 days.  [facility] CM Short  Term Goal #2 Start Date  05/22/18  [facility] CM Short Term Goal #2 Met Date  09/12/18     Appointments:  Attended appointment with primary care provider on 08/28/2018 and has scheduled follow up on 12/28/2018.  Plan: RN Health Coach will send [facility] referral for possible assistance with community resources related to obtaining custody of great grandson and enrolling in [employer]. RN Health Coach will make next telephone outreach to patient within the month of April. RN Health Coach will send primary care provider quarterly update.  [place] [phone] [email or web address]

## 2018-09-21 ENCOUNTER — Encounter: Payer: Self-pay | Admitting: *Deleted

## 2018-09-21 ENCOUNTER — Other Ambulatory Visit: Payer: Self-pay | Admitting: *Deleted

## 2018-09-21 NOTE — Patient Outreach (Signed)
[facility]) Care Management  09/21/2018  [name] 1946/03/06 [id number]   CSW was able to make initial contact with patient today to perform phone assessment, as well as assess and assist with social work needs and services.  CSW introduced self, explained role and types of services provided through [place] [facility] ([place] [facility]).  CSW further explained to patient that CSW works with patient's [place], also with [place] [facility], [name]. CSW then explained the reason for the call, indicating that Ms. Tarpley thought that patient would benefit from social work services and resources to assist with obtaining guardianship of her great grandson, as well as enrolling him in school next year.  CSW obtained two HIPAA compliant identifiers from patient, which included patient's name and date of birth.  Patient reported that she and her husband, [name] are currently caring for their 44 1/2 year-old great grandson, [name] and have been since he was only three days old.  Patient and Mr. Domek are wanting to obtain full custody of [name], but realize this can be a lengthy process.  For the time being, patient would like to obtain guardianship of [name], until she is able to afford to hire an attorney to proceed with full legal custody.  Patient stated that [name] has been in prison since before [name] was born and is not scheduled to be released until 2022.  Patient went on to say that [name] and father have never even laid eyes on [name], but she fears that her granddaughter, [name] will try to take the baby from them once she is released from prison.    CSW explained to patient that she will first need to file a petition with the court to obtain legal guardianship of [name].  CSW further explained to patient that it may be difficult for her and Mr. Roppolo to obtain legal  guardianship without an attorney, but that they may be eligible for a guardian ad litem.  CSW then explained some of the circumstances that warrant placing a child with grandparents: 1.  Both parents are deemed unfit.  2.  Both parents consent to giving the grandparents custody. 3.  Documented abuse or neglect in the parents' home.  4.  Drug or alcohol abuse in the child's home.  5.  A parent's mental illness.  6.  One parent is unfit, and the other can't or won't take the child.  Patient does not believe that her granddaughter is fit to raise [name] and reported that her granddaughter does not know who the father is of [name].  Patient fears that her granddaughter will not consent to giving patient and Mr. Neault custody, that is why she is wanting to proceed with legal guardianship.  Patient went on to say that her granddaughter has a history of polysubstance abuse and suffers from Bipolar Disorder.  CSW was able to provide patient with explicit instructions on how to apply for legal guardianship at the [name].  CSW explained to patient that she has a very good chance of gaining custody of [name], as this appears to be in his best interest.    CSW then explained to patient the factors that determine what is in the best interest of the child, which include all of the following: Physical and emotional health needs of child  Safety  Welfare  Capability of parents or grandparents to meet child's needs  Wishes of parents  or grandparents  Wishes of child if child is capable of making decisions  Strength of relationship between grandparents and grandchild  Length of relationship between grandparents and grandchild  Evidence of abuse or neglect by parents or grandparents  Evidence of substance abuse by parents or grandparents  Child's adjustment to home, school, or community  Ability of parents or grandparents to provide love, affection, and contact  Distance between child  and parents or grandparents   CSW will perform a case closure on patient, as all goals of treatment have been met from social work standpoint and no additional social work needs have been identified at this time.  CSW will notify patient's [place] with [place] [facility], [name] of CSW's plans to close patient's case.  CSW will fax an update to patient's Primary Care Physician, Dr. Tyrone Apple Plotnikov to ensure that they are aware of CSW's involvement with patient's plan of care.  CSW was able to confirm that patient has the correct contact information for CSW, encouraging patient to contact CSW directly if additional social work needs arise in the near future.  Patient voiced understanding, was agreeable to this plan and extremely appreciative of the call and all information provided.  Nat Christen, BSW, MSW, LCSW  Licensed Education officer, [facility]  Mailing [place] [address] [place], [place] [address] Physical Address-[address], [place], [place] [address] Toll Free Main # [phone] Fax # [phone] Cell # [phone]  Office # [phone] [email or web address]

## 2018-09-22 ENCOUNTER — Ambulatory Visit (INDEPENDENT_AMBULATORY_CARE_PROVIDER_SITE_OTHER): Payer: Medicare Other | Admitting: Pharmacist Clinician (PhC)/ Clinical Pharmacy Specialist

## 2018-09-22 DIAGNOSIS — I4891 Unspecified atrial fibrillation: Secondary | ICD-10-CM | POA: Diagnosis not present

## 2018-09-22 DIAGNOSIS — Z5181 Encounter for therapeutic drug level monitoring: Secondary | ICD-10-CM

## 2018-09-22 LAB — POCT INR: INR: 2 (ref 2.0–3.0)

## 2018-09-26 ENCOUNTER — Ambulatory Visit: Payer: Medicare Other | Admitting: *Deleted

## 2018-09-28 ENCOUNTER — Other Ambulatory Visit: Payer: Self-pay

## 2018-09-28 ENCOUNTER — Encounter (HOSPITAL_COMMUNITY): Payer: Self-pay | Admitting: Nurse Practitioner

## 2018-09-28 ENCOUNTER — Ambulatory Visit (HOSPITAL_COMMUNITY)
Admission: RE | Admit: 2018-09-28 | Discharge: 2018-09-28 | Disposition: A | Discharge status: Home / Self Care | Payer: Medicare Other | Source: Ambulatory Visit | Attending: Nurse Practitioner | Admitting: Nurse Practitioner

## 2018-09-28 VITALS — BP 136/58 | HR 69 | Ht 60.0 in | Wt 159.0 lb

## 2018-09-28 DIAGNOSIS — Z87891 Personal history of nicotine dependence: Secondary | ICD-10-CM | POA: Diagnosis not present

## 2018-09-28 DIAGNOSIS — I2510 Atherosclerotic heart disease of native coronary artery without angina pectoris: Secondary | ICD-10-CM | POA: Insufficient documentation

## 2018-09-28 DIAGNOSIS — Z794 Long term (current) use of insulin: Secondary | ICD-10-CM | POA: Insufficient documentation

## 2018-09-28 DIAGNOSIS — Z955 Presence of coronary angioplasty implant and graft: Secondary | ICD-10-CM | POA: Insufficient documentation

## 2018-09-28 DIAGNOSIS — Z7901 Long term (current) use of anticoagulants: Secondary | ICD-10-CM | POA: Insufficient documentation

## 2018-09-28 DIAGNOSIS — Z8673 Personal history of transient ischemic attack (TIA), and cerebral infarction without residual deficits: Secondary | ICD-10-CM | POA: Diagnosis not present

## 2018-09-28 DIAGNOSIS — Z881 Allergy status to other antibiotic agents status: Secondary | ICD-10-CM | POA: Insufficient documentation

## 2018-09-28 DIAGNOSIS — Z886 Allergy status to analgesic agent status: Secondary | ICD-10-CM | POA: Diagnosis not present

## 2018-09-28 DIAGNOSIS — Z823 Family history of stroke: Secondary | ICD-10-CM | POA: Insufficient documentation

## 2018-09-28 DIAGNOSIS — Z953 Presence of xenogenic heart valve: Secondary | ICD-10-CM | POA: Insufficient documentation

## 2018-09-28 DIAGNOSIS — Z79899 Other long term (current) drug therapy: Secondary | ICD-10-CM | POA: Insufficient documentation

## 2018-09-28 DIAGNOSIS — I252 Old myocardial infarction: Secondary | ICD-10-CM | POA: Diagnosis not present

## 2018-09-28 DIAGNOSIS — Z940 Kidney transplant status: Secondary | ICD-10-CM | POA: Insufficient documentation

## 2018-09-28 DIAGNOSIS — Z85118 Personal history of other malignant neoplasm of bronchus and lung: Secondary | ICD-10-CM | POA: Diagnosis not present

## 2018-09-28 DIAGNOSIS — E785 Hyperlipidemia, unspecified: Secondary | ICD-10-CM | POA: Insufficient documentation

## 2018-09-28 DIAGNOSIS — H9190 Unspecified hearing loss, unspecified ear: Secondary | ICD-10-CM | POA: Diagnosis not present

## 2018-09-28 DIAGNOSIS — Z882 Allergy status to sulfonamides status: Secondary | ICD-10-CM | POA: Insufficient documentation

## 2018-09-28 DIAGNOSIS — E119 Type 2 diabetes mellitus without complications: Secondary | ICD-10-CM | POA: Insufficient documentation

## 2018-09-28 DIAGNOSIS — Z833 Family history of diabetes mellitus: Secondary | ICD-10-CM | POA: Diagnosis not present

## 2018-09-28 DIAGNOSIS — I110 Hypertensive heart disease with heart failure: Secondary | ICD-10-CM | POA: Insufficient documentation

## 2018-09-28 DIAGNOSIS — Z888 Allergy status to other drugs, medicaments and biological substances status: Secondary | ICD-10-CM | POA: Diagnosis not present

## 2018-09-28 DIAGNOSIS — Z8249 Family history of ischemic heart disease and other diseases of the circulatory system: Secondary | ICD-10-CM | POA: Diagnosis not present

## 2018-09-28 DIAGNOSIS — I509 Heart failure, unspecified: Secondary | ICD-10-CM | POA: Insufficient documentation

## 2018-09-28 DIAGNOSIS — Z79890 Hormone replacement therapy (postmenopausal): Secondary | ICD-10-CM | POA: Insufficient documentation

## 2018-09-28 DIAGNOSIS — Z905 Acquired absence of kidney: Secondary | ICD-10-CM | POA: Insufficient documentation

## 2018-09-28 DIAGNOSIS — I4891 Unspecified atrial fibrillation: Secondary | ICD-10-CM

## 2018-09-28 NOTE — Progress Notes (Signed)
[name] [name], female   DOB: [birth year], 73 y.o.   MRN: [id number]     Primary Care Physician: [name], MD Referring Physician: Dr. Roxy Manns Cardiology: Dr. Dorette Grate Doucette is a 73 y.o. female with a h/o  status post aortic valve replacement using a bioprosthetic tissue valve with maze procedure on 04/12/2013.  Since then she has remained clinically stable. She describes stable symptoms of exertional shortness of breath that occur only when she is doing moderate activity. She denies any palpitations or other symptoms to suggest a recurrence of atrial fibrillation. She continues on warfarin. She is also being followed for a stable lung nodule by Dr. Roxy Manns.  F/u in afib clinic for long term f/u of maze procedure . She is here for one year f/u. She continues to do well without any evidence of afib. She continues on coumadin for CVA in 2009.She walks with a cane but no falls.  She reports that she was in the hospital in the summer for CHF and fluid retention. She had gotten slack in her diet and started eating a lot of salt. Since then she has been very careful with salt and her fluid status has been stable.  F/u in afib clinic, 3/12. She is here for long term surveillance of afib after Maze procedure. She states despite her other health problems she feels she is doing well. She is in Sr and has not noted any irregular heart beat. She does walk with a walker but has not had any falls.  Today, she denies symptoms of palpitations, chest pain, shortness of breath, orthopnea, PND, lower extremity edema, dizziness, presyncope, syncope, or neurologic sequela. The patient is tolerating medications without difficulties and is otherwise without complaint today.   Past Medical History:  Diagnosis Date  . Adenocarcinoma of lung, stage 1, right ([place]) 11/25/2017  . Anemia, iron deficiency    of chronic disease  . Aortic stenosis    a. Severe AS by echo 11/2012.  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 Asystole ([place])    a. During ENT surgery 2005: developed marked asystole requiring CPR, felt due to vagal reaction (cath nonobst dz).  . Carotid artery disease ([place])    a. Carotid Dopplers performed in August 2013 showed 40-59% left stenosis and 0-39% right; f/u recommended in 2 years.   . Cerebrovascular accident [facility]) 2009   a. LMCA infarct felt embolic 0737, maintained on chronic coumadin.; denies residual on 04/05/2013  . CHF (congestive heart failure) ([place])   . Cholelithiasis   . Chronic cough onset 03/2010   Dr Melvyn Novas  . Chronic Persistent Atrial Fibrillation 12/31/2008   Qualifier: Diagnosis of  By: [name]    . Coronary artery disease 05/2002   a. Ant MI 2003 s/p PTCA/stent to RCA.   . Diverticulosis of colon   . Esophagitis, reflux   . ESRD (end stage renal disease) ([place])    a. Mass on L kidney per pt s/p nephrectomy - pt states not cancer - [facility] notes indicate ESRD due to HTN/DM - was previously on HD. b. Kidney transplant 02/2011.  Marland Kitchen GERD (gastroesophageal reflux disease)   . Gout   . Helicobacter pylori (H. pylori) infection    hx of  . Hemorrhoids   . Hx of colonic polyps    adenomatous  . Hyperlipidemia   . Hypertension   . Lung nodule seen on imaging study 04/07/2013   1.0 cm  ground glass opacity RUL  . Myocardial infarction ([place]) 2003  . Pericardial effusion    a. Small by echo 11/2011.  Marland Kitchen Pruritic dermatitis    treated with steriods/UV light  . S/P aortic valve replacement with bioprosthetic valve and maze procedure 04/12/2013   43mm [facility] Ease bovine pericardial tissue valve   . S/P Maze operation for atrial fibrillation 04/12/2013   Complete bilateral atrial lesion set using bipolar radiofrequency and cryothermy ablation with clipping of LA appendage  . Sleep apnea    Pt says testing was positive, intolerant of CPAP.  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  . Type II diabetes mellitus ([place])   . Unspecified hearing loss    Past Surgical History:  Procedure Laterality Date  . AORTIC VALVE REPLACEMENT N/A 04/12/2013   Procedure: AORTIC VALVE REPLACEMENT (AVR);  Surgeon: Rexene Alberts, MD;  Location: [place];  Service: Open Heart Surgery;  Laterality: N/A;  . ARTERIOVENOUS GRAFT PLACEMENT Left   . ARTERIOVENOUS GRAFT PLACEMENT Left    "I've had 2 on my left; had one removed" (04/05/2013)   . ARTERY EXPLORATION Right 04/11/2013   Procedure: ARTERY EXPLORATION;  Surgeon: Rexene Alberts, MD;  Location: [place];  Service: Open Heart Surgery;  Laterality: Right;  Right carotid artery exploration  . AV FISTULA PLACEMENT Right   . AV FISTULA REPAIR Right    "took it out" ((/18/2014)  . CARDIOVERSION  05/29/2012   Procedure: CARDIOVERSION;  Surgeon: Lelon Perla, MD;  [facility];  Service: Cardiovascular;  Laterality: N/A;  . CHOLECYSTECTOMY  2009   with hernia removal  . CORONARY ANGIOPLASTY WITH STENT PLACEMENT Right    coronary artery  . INSERTION OF DIALYSIS CATHETER Bilateral    "over the years; took them both out" (04/05/2013)  . INTRAOPERATIVE TRANSESOPHAGEAL ECHOCARDIOGRAM N/A 04/11/2013   Procedure: INTRAOPERATIVE TRANSESOPHAGEAL ECHOCARDIOGRAM;  Surgeon: Rexene Alberts, MD;  Location: [place];  Service: Open Heart Surgery;  Laterality: N/A;  . INTRAOPERATIVE TRANSESOPHAGEAL ECHOCARDIOGRAM N/A 04/12/2013   Procedure: INTRAOPERATIVE TRANSESOPHAGEAL ECHOCARDIOGRAM;  Surgeon: Rexene Alberts, MD;  Location: [place];  Service: Open Heart Surgery;  Laterality: N/A;  . KIDNEY TRANSPLANT  03/16/11  . LEFT AND RIGHT HEART CATHETERIZATION WITH CORONARY ANGIOGRAM N/A 04/06/2013   Procedure: LEFT AND RIGHT HEART CATHETERIZATION WITH CORONARY ANGIOGRAM;  Surgeon: Blane Ohara, MD;  Location: [facility] CATH LAB;  Service: Cardiovascular;   Laterality: N/A;  . MAZE N/A 04/12/2013   Procedure: MAZE;  Surgeon: Rexene Alberts, MD;  Location: [place];  Service: Open Heart Surgery;  Laterality: N/A;  . NASAL RECONSTRUCTION WITH SEPTAL REPAIR     "took it out" (04/05/2013)  . NEPHRECTOMY Left 2010   no CA on bx  . TONSILLECTOMY    . TOTAL ABDOMINAL HYSTERECTOMY    . TUBAL LIGATION      Current Outpatient Medications  Medication Sig Dispense Refill  . amLODipine (NORVASC) 10 MG tablet Take 0.5 tablets (5 mg total) by mouth daily. (Patient taking differently: Take 5 mg by mouth daily. Twice a week) 90 tablet 3  . dapsone 25 MG tablet Take 25 mg by mouth daily.     Marland Kitchen gabapentin (NEURONTIN) 100 MG capsule Take 100 mg by mouth 2 (two) times daily.     Marland Kitchen glucose blood (ONETOUCH VERIO) test strip 1 each by  Other route 3 (three) times daily as needed for other. Dx E11.9 150 each 5  . Insulin Glargine, 1 Unit Dial, (TOUJEO SOLOSTAR) 300 UNIT/ML SOPN Inject 24 Units into the skin every morning. Titrate up by 1 unit a day for goal sugars of 100-130 up to 50 units a day 4 pen 11  . Insulin Pen Needle (B-D UF III MINI PEN NEEDLES) 31G X 5 MM MISC USE TO ADMINISTER INSULIN FOUR TIMES A DAY DX E11.9 100 each 1  . lisinopril (PRINIVIL,ZESTRIL) 5 MG tablet Take 5 mg by mouth at bedtime.     . metoprolol tartrate (LOPRESSOR) 25 MG tablet TAKE 0.5 TABLETS (12.5 MG TOTAL) BY MOUTH 2 (TWO) TIMES DAILY. TAKE 1 TABLET BY MOUTH TWICE A DAY 180 tablet 3  . Multiple Vitamins-Minerals (CENTRUM SILVER 50+WOMEN) TABS Take 1 tablet by mouth daily with breakfast.    . NON FORMULARY 2 L oxygen at home as needed    . ONETOUCH DELICA LANCETS 29J MISC Use to check blood sugars three times a day DX E11.9 100 each 5  . oxyCODONE-acetaminophen (ROXICET) 5-325 MG tablet Take 0.5-1 tablets by mouth every 6 (six) hours as needed for severe pain. 100 tablet 0  . predniSONE (DELTASONE) 5 MG tablet Take 5 mg by mouth daily. Pt reports 3 times a week  5  . rosuvastatin (CRESTOR)  20 MG tablet Take 1 tablet (20 mg total) by mouth daily. 30 tablet 11  . tacrolimus (PROGRAF) 1 MG capsule Take 3 mg by mouth 2 (two) times daily. Take 3 tablets by mouth twice daily     . torsemide (DEMADEX) 20 MG tablet Take 1 tablet (20 mg total) by mouth 2 (two) times daily. 60 tablet 3  . warfarin (COUMADIN) 5 MG tablet Take 1/2 to 1 tablet by mouth daily as directed 90 tablet 1  . amoxicillin (AMOXIL) 500 MG capsule TAKE 4 CAPSULES BY MOUTH 1 HOUR PRIOR TO DENTAL APPOINTMENT (Patient not taking: Reported on 09/28/2018) 12 capsule 1  . levothyroxine (SYNTHROID, LEVOTHROID) 50 MCG tablet Take 1 tablet (50 mcg total) by mouth daily before breakfast. 30 tablet 0   No current facility-administered medications for this encounter.     Allergies  Allergen Reactions  . Ibuprofen Nausea And Vomiting  . Sulfamethoxazole-Trimethoprim Itching, Swelling and Rash    Swelling of the face  . Sulfonamide Derivatives Itching, Swelling and Rash    Swelling of the face  . Tape Rash    Paper tape is ok  . Tramadol Nausea And Vomiting  . Doxycycline     nausea  . Hydrocil [Psyllium] Nausea And Vomiting  . Bactrim Itching, Swelling and Rash  . Red Dye Itching and Rash    Social History   Socioeconomic History  . Marital status: Married    Spouse name: Not on file  . Number of children: 5  . Years of education: 65  . Highest education level: Not on file  Occupational History  . Occupation: retired    Fish farm manager: RETIRED  Social Needs  . Financial resource strain: Not on file  . Food insecurity:    Worry: Not on file    Inability: Not on file  . Transportation needs:    Medical: Not on file    Non-medical: Not on file  Tobacco Use  . Smoking status: Former Smoker    Packs/day: 1.00    Years: 30.00    Pack years: 30.00    Types: Cigarettes    Last  attempt to quit: 07/19/2001    Years since quitting: 17.2  . Smokeless tobacco: Never Used  Substance and Sexual Activity  . Alcohol use: No   . Drug use: No  . Sexual activity: Not Currently  Lifestyle  . Physical activity:    Days per week: Not on file    Minutes per session: Not on file  . Stress: Not on file  Relationships  . Social connections:    Talks on phone: Not on file    Gets together: Not on file    Attends religious service: Not on file    Active member of club or organization: Not on file    Attends meetings of clubs or organizations: Not on file    Relationship status: Not on file  . Intimate partner violence:    Fear of current or ex partner: Not on file    Emotionally abused: Not on file    Physically abused: Not on file    Forced sexual activity: Not on file  Other Topics Concern  . Not on file  Social History Narrative   Patient signed a Designated Party Release to allow her spouse [name] and family and five children to have access to her medical records/information.     Patient lives with husband in a 2 story home.  Has 5 children. Retired from Personal assistant.  Education: 3 years of college.     Family History  Problem Relation Age of Onset  . Stroke Father   . Hypertension Mother   . Diabetes Other   . Diabetes Maternal Grandmother   . Diabetes Son   . Breast cancer Neg Hx     ROS- All systems are reviewed and negative except as per the HPI above  Physical Exam: Vitals:   09/28/18 1602  BP: (!) 136/58  Pulse: 69  Weight: 72.1 kg  Height: 5' (1.524 m)    GEN- The patient is well appearing, alert and oriented x 3 today.   Head- normocephalic, atraumatic Eyes-  Sclera clear, conjunctiva pink Ears- hearing intact Oropharynx- clear Neck- supple, no JVP Lymph- no cervical lymphadenopathy Lungs- Clear to ausculation bilaterally, normal work of breathing Heart- Regular rate and rhythm, no murmurs, rubs or gallops, PMI not laterally displaced GI- soft, NT, ND, + BS Extremities- no clubbing, cyanosis, or edema MS- no significant deformity or atrophy Skin- no rash or lesion Psych-  euthymic mood, full affect Neuro- strength and sensation are intact  EKG-SR at 69 bpm, pr int 176 ms, qrs int 96 ms, qtc 458  ms Epic records reviewed  Assessment and Plan: 1. Afib s/p maze 2014 Doing well long term without any evidence of afib, maintaining SR Denies any shortness of breath Continues on warfain for previous CVA  No falls Continue metoprolol  2. S/P aortic valve repalcement with bioprosthetic valve Stable   3. HTN  Stable Avoid salt  4. CHF Weight stable Avoiding salt  F/u in afib clinc in one year for long term monitoring of afib with maze procedure   [name] C. , [facility] [address] [place], [place] [address] [phone]

## 2018-10-18 ENCOUNTER — Telehealth: Payer: Self-pay | Admitting: Pharmacist Clinician (PhC)/ Clinical Pharmacy Specialist

## 2018-10-18 NOTE — Telephone Encounter (Signed)

## 2018-10-20 ENCOUNTER — Other Ambulatory Visit: Payer: Self-pay | Admitting: Internal Medicine

## 2018-10-20 ENCOUNTER — Ambulatory Visit (INDEPENDENT_AMBULATORY_CARE_PROVIDER_SITE_OTHER): Payer: Medicare Other | Admitting: Pharmacist

## 2018-10-20 ENCOUNTER — Other Ambulatory Visit: Payer: Self-pay

## 2018-10-20 DIAGNOSIS — Z5181 Encounter for therapeutic drug level monitoring: Secondary | ICD-10-CM

## 2018-10-20 DIAGNOSIS — I4891 Unspecified atrial fibrillation: Secondary | ICD-10-CM | POA: Diagnosis not present

## 2018-10-20 LAB — POCT INR: INR: 1.8 — AB (ref 2.0–3.0)

## 2018-11-02 ENCOUNTER — Encounter: Payer: Self-pay | Admitting: *Deleted

## 2018-11-02 ENCOUNTER — Other Ambulatory Visit: Payer: Self-pay | Admitting: *Deleted

## 2018-11-02 ENCOUNTER — Other Ambulatory Visit: Payer: Self-pay

## 2018-11-02 NOTE — Patient Outreach (Signed)
[place] [facility]) Care Management  [facility] Care Manager  11/02/2018   [name] 08-09-1945 [id number]   [place] Every other Month Outreach   Referral Date: 01/12/2017 Referral Source: [facility] RN Case Manager Reason for Referral: CHF education Insurance: Medicare   Outreach Attempt:  Successful telephone outreach to patient for follow up.  Patient reporting she is doing well and practicing social distancing and abiding by the stay at home order.  States her blood sugars are better controlled.  Today's fasting blood sugar was 91 with fasting ranges of 90-120's.  Does report the occasional hypoglycemic episode.  Continues to weigh daily.  Weight this morning elevated at 166 pounds.  Patient reporting swelling in her right leg and left arm.  Denies any shortness of breath and states she only wears her oxygen when needed for shortness of breath.  Reports she is now taking her torsemide daily instead of 2-3 times a week to help with the fluid retention.  Encouraged patient to notify provider if fluid retention continues.  Encounter Medications:  Outpatient Encounter Medications as of 11/02/2018  Medication Sig Note  . amLODipine (NORVASC) 10 MG tablet Take 0.5 tablets (5 mg total) by mouth daily. (Patient taking differently: Take 5 mg by mouth daily. Twice a week)   . dapsone 25 MG tablet Take 25 mg by mouth daily.    Marland Kitchen gabapentin (NEURONTIN) 100 MG capsule Take 100 mg by mouth 2 (two) times daily.  05/22/2018: Reports taking once a day mostly and twice a day when she feels she needs to  . Insulin Glargine, 1 Unit Dial, (TOUJEO SOLOSTAR) 300 UNIT/ML SOPN Inject 24 Units into the skin every morning. Titrate up by 1 unit a day for goal sugars of 100-130 up to 50 units a day 11/02/2018: Reports taking 46 units daily  . Insulin Pen Needle (B-D UF III MINI PEN NEEDLES) 31G X 5 MM MISC USE TO ADMINISTER INSULIN FOUR TIMES A DAY DX E11.9   . lisinopril (PRINIVIL,ZESTRIL) 5 MG tablet Take 5  mg by mouth at bedtime.    . metoprolol tartrate (LOPRESSOR) 25 MG tablet TAKE 0.5 TABLETS (12.5 MG TOTAL) BY MOUTH 2 (TWO) TIMES DAILY. TAKE 1 TABLET BY MOUTH TWICE A DAY   . Multiple Vitamins-Minerals (CENTRUM SILVER 50+WOMEN) TABS Take 1 tablet by mouth daily with breakfast.   . NON FORMULARY 2 L oxygen at home as needed   . ONETOUCH DELICA LANCETS 29N MISC Use to check blood sugars three times a day DX E11.9   . ONETOUCH VERIO test strip USE TO TEST 3 TIMES DAILY. DX E11.9   . oxyCODONE-acetaminophen (ROXICET) 5-325 MG tablet Take 0.5-1 tablets by mouth every 6 (six) hours as needed for severe pain.   . rosuvastatin (CRESTOR) 20 MG tablet Take 1 tablet (20 mg total) by mouth daily.   . tacrolimus (PROGRAF) 1 MG capsule Take 3 mg by mouth 2 (two) times daily. Take 3 tablets by mouth twice daily    . torsemide (DEMADEX) 20 MG tablet Take 1 tablet (20 mg total) by mouth 2 (two) times daily. 11/02/2018: Currently taking daily to help with swelling  . warfarin (COUMADIN) 5 MG tablet Take 1/2 to 1 tablet by mouth daily as directed   . amoxicillin (AMOXIL) 500 MG capsule TAKE 4 CAPSULES BY MOUTH 1 HOUR PRIOR TO DENTAL APPOINTMENT (Patient not taking: Reported on 09/28/2018)   . levothyroxine (SYNTHROID, LEVOTHROID) 50 MCG tablet Take 1 tablet (50 mcg total) by mouth daily  before breakfast. 05/22/2018: Reports continues to take  . predniSONE (DELTASONE) 5 MG tablet Take 5 mg by mouth daily. Pt reports 3 times a week    No facility-administered encounter medications on file as of 11/02/2018.     Functional Status:  In your present state of health, do you have any difficulty performing the following activities: 09/21/2018 09/12/2018  Hearing? Y Y  Comment Difficulty hearing in right ear due to Stroke. trouble hearing in right ear after stroke  Vision? N N  Difficulty concentrating or making decisions? N N  Walking or climbing stairs? N Y  Comment - trouble walking long distances  Dressing or bathing? N  N  Doing errands, shopping? N N  Preparing Food and eating ? N N  Using the Toilet? N N  In the past six months, have you accidently leaked urine? Y Y  Comment On fluid pills. taking fluid medication  Do you have problems with loss of bowel control? N N  Managing your Medications? N N  Managing your Finances? N N  Housekeeping or managing your Housekeeping? N N  Some recent data might be hidden    Fall/Depression Screening: Fall Risk  11/02/2018 09/21/2018 09/12/2018  Falls in the past year? 1 1 1   Comment no falls in the past 11 months - no falls in the past 9 months  Number falls in past yr: 1 1 1   Comment - - -  Injury with Fall? 0 0 0  Risk for fall due to : History of fall(s);Impaired vision;Medication side effect;Impaired balance/gait;Impaired mobility History of fall(s);Impaired balance/gait;Impaired mobility;Impaired vision;Medication side effect History of fall(s);Impaired vision;Medication side effect;Impaired balance/gait;Impaired mobility  Follow up Falls evaluation completed;Falls prevention discussed;Education provided Education provided;Falls prevention discussed Falls evaluation completed;Falls prevention discussed;Education provided   [facility] 2/9 Scores 09/21/2018 09/12/2018 10/06/2017 05/05/2017 01/21/2017 01/12/2017 11/21/2015  PHQ - 2 Score 0 0 0 0 0 0 0   [facility] CM Care Plan Problem One     Most Recent Value  Care Plan Problem One  Knowledge deficiet related to self care management of diabetes and heart failure  Role Documenting the Problem One  [place] for Problem One  Active  [facility] Long Term Goal   Patient will report a resolution of edema in right leg in the next 60 days.  [facility] Long Term Goal Start Date  11/02/18  Interventions for Problem One Long Term Goal  Care plan and goals reviewed and discussed with patient, encouraged to keep and attend medical appointments, discussed telehealth appointments, encouraged to continue to weigh herself daily, encouraged to  contact physician for continued right leg and left arm swelling if not resolved with torsemide, discussed elevation of extremities, reviewed medications and encouraged medication compliance, reviewed signs and symptoms of COVID 19 and encouraged patient to contact primary care if symtoms arise, reviewed heart failure zones  [facility] CM Short Term Goal #1   Patient will report a decrease in Hgb A1C by 0.2 points in the next 90 days.  [facility] CM Short Term Goal #1 Start Date  11/02/18  Interventions for Short Term Goal #1  Reviewed current glucose ranges and congratulated patient on current ranges, reviewed insulin and titration orders, encouraged to continue to monitor blood sugars and notify provider for sustained increases, reviewed signs and symptoms of hypo and hyperglycemia     Appointments: Has scheduled follow up appointment with primary care provider, Dr. Alain Marion on 12/28/2018.  Plan: RN Health Coach will send primary care  provider quarterly update. RN Health Coach will make next telephone outreach to patient within the month of June.  [place] [phone] [email or web address]

## 2018-11-17 ENCOUNTER — Telehealth: Payer: Self-pay

## 2018-11-17 NOTE — Telephone Encounter (Signed)

## 2018-11-20 ENCOUNTER — Other Ambulatory Visit: Payer: Self-pay

## 2018-11-20 ENCOUNTER — Ambulatory Visit (INDEPENDENT_AMBULATORY_CARE_PROVIDER_SITE_OTHER): Payer: Medicare Other | Admitting: Pharmacist

## 2018-11-20 DIAGNOSIS — I4891 Unspecified atrial fibrillation: Secondary | ICD-10-CM

## 2018-11-20 DIAGNOSIS — Z940 Kidney transplant status: Secondary | ICD-10-CM | POA: Diagnosis not present

## 2018-11-20 DIAGNOSIS — E119 Type 2 diabetes mellitus without complications: Secondary | ICD-10-CM | POA: Diagnosis not present

## 2018-11-20 DIAGNOSIS — Z5181 Encounter for therapeutic drug level monitoring: Secondary | ICD-10-CM | POA: Diagnosis not present

## 2018-11-20 DIAGNOSIS — N2581 Secondary hyperparathyroidism of renal origin: Secondary | ICD-10-CM | POA: Diagnosis not present

## 2018-11-20 LAB — POCT INR: INR: 1.6 — AB (ref 2.0–3.0)

## 2018-11-22 ENCOUNTER — Ambulatory Visit (INDEPENDENT_AMBULATORY_CARE_PROVIDER_SITE_OTHER): Payer: Medicare Other | Admitting: Internal Medicine

## 2018-11-22 ENCOUNTER — Other Ambulatory Visit: Payer: Self-pay | Admitting: Internal Medicine

## 2018-11-22 ENCOUNTER — Encounter: Payer: Self-pay | Admitting: Internal Medicine

## 2018-11-22 ENCOUNTER — Other Ambulatory Visit: Payer: Self-pay | Admitting: *Deleted

## 2018-11-22 DIAGNOSIS — I4891 Unspecified atrial fibrillation: Secondary | ICD-10-CM

## 2018-11-22 DIAGNOSIS — N644 Mastodynia: Secondary | ICD-10-CM | POA: Diagnosis not present

## 2018-11-22 DIAGNOSIS — R269 Unspecified abnormalities of gait and mobility: Secondary | ICD-10-CM

## 2018-11-22 DIAGNOSIS — R609 Edema, unspecified: Secondary | ICD-10-CM | POA: Diagnosis not present

## 2018-11-22 DIAGNOSIS — C3490 Malignant neoplasm of unspecified part of unspecified bronchus or lung: Secondary | ICD-10-CM

## 2018-11-22 DIAGNOSIS — Z7901 Long term (current) use of anticoagulants: Secondary | ICD-10-CM | POA: Diagnosis not present

## 2018-11-22 NOTE — Assessment & Plan Note (Signed)
Continue with torsemide use as needed

## 2018-11-22 NOTE — Assessment & Plan Note (Signed)
Left breast pain, heavy sensation, ridges formation on the inner upper quadrant of the nipple of 1 week duration.  There was no color changes.  Obtain a diagnostic mammogram/ultrasound.

## 2018-11-22 NOTE — Progress Notes (Signed)
Virtual Visit via Video Note  I connected with [name] on 11/22/18 at  2:40 PM EDT by a video enabled telemedicine application and verified that I am speaking with the correct person using two identifiers.   I discussed the limitations of evaluation and management by telemedicine and the availability of in person appointments. The patient expressed understanding and agreed to proceed.  History of Present Illness: We need to follow-up on anticoagulation, hypertension, gait disorder She is complaining of left breast pain, heavy sensation, ridges formation on the inner upper quadrant of the nipple of 1 week duration.  There was no color changes. The patient is complaining of the swelling of the left arm, right leg.  Her INR was checked yesterday and was 1.6.  She is taking more Coumadin now. She has to use a scooter.  I need to fill out/sign paperwork for the scooter.  She has to use her power scooter in the house and when she goes out.  There has been no runny nose, cough, chest pain, shortness of breath, abdominal pain, diarrhea, constipation, arthralgias, skin rashes.   Observations/Objective: The patient appears to be in no acute distress, looks well. Left breast seems to be swollen. Left arm seems to be swollen approximately, some swelling of the left hand is present Right ankle seems to be swollen The patient is alert, oriented, cooperative Assessment and Plan: COVID-19 issues discussed See my Assessment and Plan. Follow Up Instructions:    I discussed the assessment and treatment plan with the patient. The patient was provided an opportunity to ask questions and all were answered. The patient agreed with the plan and demonstrated an understanding of the instructions.   The patient was advised to call back or seek an in-person evaluation if the symptoms worsen or if the condition fails to improve as anticipated.  I provided face-to-face time during this encounter. We were at  different locations.   Walker Kehr, MD

## 2018-11-22 NOTE — Assessment & Plan Note (Signed)
Follow up with oncology

## 2018-11-22 NOTE — Patient Outreach (Signed)
[place] [facility]) Care Management  11/22/2018  [name] [birth year] [id number]   RN Health CoachPatient Concern/Question Outreach  Referral Date:01/12/2017 Referral Source:[facility] RN Case Manager Reason for Referral:CHF education Insurance:Medicare   Outreach Attempt:  Received voicemail from patient requesting telephone call back.  Successful telephone outreach to patient.  HIPAA verified with patient.  Patient stating she has been having bilateral breast heaviness, pain, and swelling.  States [facility] and they are recommending a diagnostic mammogram and ultrasound.  Patient is requesting help getting these arranged.  RN Health Coach outreached to primary care provider's office with patient conferenced on line.  Spoke with Nurse [name] and arranged Telehealth appointment for today at 2:40.  Patient stated her understanding of appointment to help arrange referral to the [place].  Plan: RN Health Coach will make next telephone outreach to patient within the month of June as previously scheduled.  [place] [phone] [name]

## 2018-11-22 NOTE — Assessment & Plan Note (Signed)
On Coumadin 

## 2018-11-22 NOTE — Assessment & Plan Note (Signed)
Subtherapeutic INR 1.6.  Coumadin dose was increased

## 2018-11-22 NOTE — Assessment & Plan Note (Signed)
Chronic, multifactorial  The patient has stood up and on an electric scooter for getting around her home and outside her home

## 2018-12-01 ENCOUNTER — Telehealth: Payer: Self-pay

## 2018-12-01 NOTE — Telephone Encounter (Signed)
LMOM FOR PRESCREEN

## 2018-12-04 ENCOUNTER — Ambulatory Visit (HOSPITAL_COMMUNITY)
Admission: RE | Admit: 2018-12-04 | Discharge: 2018-12-04 | Disposition: A | Discharge status: Home / Self Care | Payer: Medicare Other | Source: Ambulatory Visit | Attending: Internal Medicine | Admitting: Internal Medicine

## 2018-12-04 ENCOUNTER — Other Ambulatory Visit: Payer: Self-pay

## 2018-12-04 ENCOUNTER — Ambulatory Visit (INDEPENDENT_AMBULATORY_CARE_PROVIDER_SITE_OTHER): Payer: Medicare Other | Admitting: Pharmacist Clinician (PhC)/ Clinical Pharmacy Specialist

## 2018-12-04 DIAGNOSIS — I4891 Unspecified atrial fibrillation: Secondary | ICD-10-CM

## 2018-12-04 DIAGNOSIS — C3490 Malignant neoplasm of unspecified part of unspecified bronchus or lung: Secondary | ICD-10-CM | POA: Diagnosis not present

## 2018-12-04 DIAGNOSIS — Z5181 Encounter for therapeutic drug level monitoring: Secondary | ICD-10-CM

## 2018-12-04 DIAGNOSIS — R918 Other nonspecific abnormal finding of lung field: Secondary | ICD-10-CM | POA: Diagnosis not present

## 2018-12-04 LAB — POCT INR: INR: 1.8 — AB (ref 2.0–3.0)

## 2018-12-04 NOTE — Patient Instructions (Addendum)
Description    Spoke with pt and instructed her to take 1.5 tabs today then change dose to 1 tablet daily. Repeat INR in 2 weeks

## 2018-12-05 ENCOUNTER — Other Ambulatory Visit: Payer: Self-pay | Admitting: Internal Medicine

## 2018-12-05 ENCOUNTER — Ambulatory Visit
Admission: RE | Admit: 2018-12-05 | Discharge: 2018-12-05 | Disposition: A | Discharge status: Home / Self Care | Payer: Medicare Other | Source: Ambulatory Visit | Attending: Internal Medicine | Admitting: Internal Medicine

## 2018-12-05 ENCOUNTER — Ambulatory Visit: Payer: Medicare Other

## 2018-12-05 DIAGNOSIS — N644 Mastodynia: Secondary | ICD-10-CM

## 2018-12-06 ENCOUNTER — Other Ambulatory Visit: Payer: Self-pay | Admitting: Internal Medicine

## 2018-12-06 MED ORDER — TORSEMIDE 100 MG PO TABS: 100.0000 mg | ORAL_TABLET | ORAL | 5 refills | Status: DC

## 2018-12-08 ENCOUNTER — Other Ambulatory Visit: Payer: Self-pay

## 2018-12-08 ENCOUNTER — Inpatient Hospital Stay: Payer: Medicare Other | Attending: Internal Medicine

## 2018-12-08 DIAGNOSIS — Z79899 Other long term (current) drug therapy: Secondary | ICD-10-CM | POA: Insufficient documentation

## 2018-12-08 DIAGNOSIS — Z794 Long term (current) use of insulin: Secondary | ICD-10-CM | POA: Diagnosis not present

## 2018-12-08 DIAGNOSIS — E119 Type 2 diabetes mellitus without complications: Secondary | ICD-10-CM | POA: Diagnosis not present

## 2018-12-08 DIAGNOSIS — Z923 Personal history of irradiation: Secondary | ICD-10-CM | POA: Insufficient documentation

## 2018-12-08 DIAGNOSIS — I4891 Unspecified atrial fibrillation: Secondary | ICD-10-CM | POA: Insufficient documentation

## 2018-12-08 DIAGNOSIS — E041 Nontoxic single thyroid nodule: Secondary | ICD-10-CM | POA: Insufficient documentation

## 2018-12-08 DIAGNOSIS — Z940 Kidney transplant status: Secondary | ICD-10-CM | POA: Insufficient documentation

## 2018-12-08 DIAGNOSIS — Z8673 Personal history of transient ischemic attack (TIA), and cerebral infarction without residual deficits: Secondary | ICD-10-CM | POA: Diagnosis not present

## 2018-12-08 DIAGNOSIS — Z7901 Long term (current) use of anticoagulants: Secondary | ICD-10-CM | POA: Diagnosis not present

## 2018-12-08 DIAGNOSIS — C3490 Malignant neoplasm of unspecified part of unspecified bronchus or lung: Secondary | ICD-10-CM

## 2018-12-08 DIAGNOSIS — I10 Essential (primary) hypertension: Secondary | ICD-10-CM | POA: Insufficient documentation

## 2018-12-08 DIAGNOSIS — C3411 Malignant neoplasm of upper lobe, right bronchus or lung: Secondary | ICD-10-CM | POA: Diagnosis not present

## 2018-12-08 LAB — CMP (CANCER CENTER ONLY)
ALT: 15 U/L (ref 0–44)
AST: 23 U/L (ref 15–41)
Albumin: 2.8 g/dL — ABNORMAL LOW (ref 3.5–5.0)
Alkaline Phosphatase: 128 U/L — ABNORMAL HIGH (ref 38–126)
Anion gap: 8 (ref 5–15)
BUN: 36 mg/dL — ABNORMAL HIGH (ref 8–23)
CO2: 31 mmol/L (ref 22–32)
Calcium: 10.1 mg/dL (ref 8.9–10.3)
Chloride: 105 mmol/L (ref 98–111)
Creatinine: 1.92 mg/dL — ABNORMAL HIGH (ref 0.44–1.00)
GFR, Est AFR Am: 29 mL/min — ABNORMAL LOW (ref >60)
GFR, Estimated: 25 mL/min — ABNORMAL LOW (ref >60)
Glucose, Bld: 83 mg/dL (ref 70–99)
Potassium: 3.9 mmol/L (ref 3.5–5.1)
Sodium: 144 mmol/L (ref 135–145)
Total Bilirubin: 0.4 mg/dL (ref 0.3–1.2)
Total Protein: 6.7 g/dL (ref 6.5–8.1)

## 2018-12-08 LAB — CBC WITH DIFFERENTIAL (CANCER CENTER ONLY)
Abs Immature Granulocytes: 0.02 10*3/uL (ref 0.00–0.07)
Basophils Absolute: 0 10*3/uL (ref 0.0–0.1)
Basophils Relative: 0 %
Eosinophils Absolute: 0.1 10*3/uL (ref 0.0–0.5)
Eosinophils Relative: 1 %
HCT: 43.4 % (ref 36.0–46.0)
Hemoglobin: 12.8 g/dL (ref 12.0–15.0)
Immature Granulocytes: 0 %
Lymphocytes Relative: 15 %
Lymphs Abs: 1 10*3/uL (ref 0.7–4.0)
MCH: 24.4 pg — ABNORMAL LOW (ref 26.0–34.0)
MCHC: 29.5 g/dL — ABNORMAL LOW (ref 30.0–36.0)
MCV: 82.8 fL (ref 80.0–100.0)
Monocytes Absolute: 0.9 10*3/uL (ref 0.1–1.0)
Monocytes Relative: 13 %
Neutro Abs: 4.9 10*3/uL (ref 1.7–7.7)
Neutrophils Relative %: 71 %
Platelet Count: 207 10*3/uL (ref 150–400)
RBC: 5.24 MIL/uL — ABNORMAL HIGH (ref 3.87–5.11)
RDW: 20.9 % — ABNORMAL HIGH (ref 11.5–15.5)
WBC Count: 7 10*3/uL (ref 4.0–10.5)
nRBC: 0 % (ref 0.0–0.2)

## 2018-12-12 ENCOUNTER — Other Ambulatory Visit: Payer: Self-pay

## 2018-12-12 ENCOUNTER — Encounter: Payer: Self-pay | Admitting: Internal Medicine

## 2018-12-12 ENCOUNTER — Inpatient Hospital Stay (HOSPITAL_BASED_OUTPATIENT_CLINIC_OR_DEPARTMENT_OTHER): Payer: Medicare Other | Admitting: Internal Medicine

## 2018-12-12 VITALS — BP 151/74 | HR 72 | Temp 98.5°F | Resp 20 | Ht 61.0 in | Wt 185.2 lb

## 2018-12-12 DIAGNOSIS — Z79899 Other long term (current) drug therapy: Secondary | ICD-10-CM | POA: Diagnosis not present

## 2018-12-12 DIAGNOSIS — Z7901 Long term (current) use of anticoagulants: Secondary | ICD-10-CM

## 2018-12-12 DIAGNOSIS — Z8673 Personal history of transient ischemic attack (TIA), and cerebral infarction without residual deficits: Secondary | ICD-10-CM

## 2018-12-12 DIAGNOSIS — Z923 Personal history of irradiation: Secondary | ICD-10-CM

## 2018-12-12 DIAGNOSIS — E119 Type 2 diabetes mellitus without complications: Secondary | ICD-10-CM | POA: Diagnosis not present

## 2018-12-12 DIAGNOSIS — E041 Nontoxic single thyroid nodule: Secondary | ICD-10-CM

## 2018-12-12 DIAGNOSIS — I10 Essential (primary) hypertension: Secondary | ICD-10-CM | POA: Diagnosis not present

## 2018-12-12 DIAGNOSIS — I5033 Acute on chronic diastolic (congestive) heart failure: Secondary | ICD-10-CM

## 2018-12-12 DIAGNOSIS — C3411 Malignant neoplasm of upper lobe, right bronchus or lung: Secondary | ICD-10-CM | POA: Diagnosis not present

## 2018-12-12 DIAGNOSIS — C3491 Malignant neoplasm of unspecified part of right bronchus or lung: Secondary | ICD-10-CM

## 2018-12-12 DIAGNOSIS — C3490 Malignant neoplasm of unspecified part of unspecified bronchus or lung: Secondary | ICD-10-CM

## 2018-12-12 DIAGNOSIS — Z794 Long term (current) use of insulin: Secondary | ICD-10-CM

## 2018-12-12 DIAGNOSIS — I4891 Unspecified atrial fibrillation: Secondary | ICD-10-CM | POA: Diagnosis not present

## 2018-12-12 DIAGNOSIS — Z940 Kidney transplant status: Secondary | ICD-10-CM

## 2018-12-12 NOTE — Progress Notes (Signed)
[facility] [phone]   [phone]  OFFICE PROGRESS NOTE  Plotnikov, [name], MD [place] [address]  DIAGNOSIS: Stage IA (T1b, N0, M0) non-small cell lung cancer, well-differentiated adenocarcinoma with biopsy-proven right upper lobe pulmonary nodule diagnosed in May 2019.  The patient also has 2 other suspicious nodule in the right upper lobe and left lower lobe but they are too small to characterize at this point and they could represent multifocal disease.  PRIOR THERAPY: Curative stereotactic radiotherapy to the right upper lobe pulmonary nodule under the care of Dr. Sondra Come completed on December 29, 2017.  CURRENT THERAPY: Observation.  INTERVAL HISTORY: [name] 73 y.o. female returns to the clinic today for 6 months follow-up visit.  The patient is feeling fine today with no concerning complaints except for the baseline fatigue and shortness of breath as well as the swelling of the lower extremities.  She has a history of congestive heart failure and she is currently on torsemide by her cardiologist.  The patient denied having any chest pain, cough or hemoptysis.  She denied having any fever or chills.  She has no nausea, vomiting, diarrhea or constipation.  She had repeat CT scan of the chest performed recently and she is here for evaluation and discussion of her scan results.   MEDICAL HISTORY: Past Medical History:  Diagnosis Date   Adenocarcinoma of lung, stage 1, right ([facility]) 11/25/2017   Anemia, iron deficiency    of chronic disease   Aortic stenosis    a. Severe AS by echo 11/2012.   Aphasia due to late effects of cerebrovascular disease    Asystole ([facility])    a. During ENT surgery 2005: developed marked asystole requiring CPR, felt due to vagal reaction (cath nonobst dz).   Carotid artery disease ([facility])    a. Carotid Dopplers performed in August 2013 showed 40-59% left stenosis and 0-39% right; f/u recommended  in 2 years.    Cerebrovascular accident [facility]) 2009   a. LMCA infarct felt embolic 3818, maintained on chronic coumadin.; denies residual on 04/05/2013   CHF (congestive heart failure) (HCC)    Cholelithiasis    Chronic cough onset 03/2010   Dr Melvyn Novas   Chronic Persistent Atrial Fibrillation 12/31/2008   Qualifier: Diagnosis of  By: [name]     Coronary artery disease 05/2002   a. Ant MI 2003 s/p PTCA/stent to RCA.    Diverticulosis of colon    Esophagitis, reflux    ESRD (end stage renal disease) ([facility])    a. Mass on L kidney per pt s/p nephrectomy - pt states not cancer - [facility] notes indicate ESRD due to HTN/DM - was previously on HD. b. Kidney transplant 02/2011.   GERD (gastroesophageal reflux disease)    Gout    Helicobacter pylori (H. pylori) infection    hx of   Hemorrhoids    Hx of colonic polyps    adenomatous   Hyperlipidemia    Hypertension    Lung nodule seen on imaging study 04/07/2013   1.0 cm ground glass opacity RUL   Myocardial infarction [facility]) 2003   Pericardial effusion    a. Small by echo 11/2011.   Pruritic dermatitis    treated with steriods/UV light   S/P aortic valve replacement with bioprosthetic valve and maze procedure 04/12/2013   3mm [facility] Ease bovine pericardial tissue valve    S/P Maze operation for atrial fibrillation 04/12/2013  Complete bilateral atrial lesion set using bipolar radiofrequency and cryothermy ablation with clipping of LA appendage   Sleep apnea    Pt says testing was positive, intolerant of CPAP.   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   Type II diabetes mellitus (HCC)    Unspecified hearing loss     ALLERGIES:  is allergic to ibuprofen; sulfamethoxazole-trimethoprim; sulfonamide derivatives; tape; tramadol; doxycycline; hydrocil  [psyllium]; bactrim; and red dye.  MEDICATIONS:  Current Outpatient Medications  Medication Sig Dispense Refill   amLODipine (NORVASC) 10 MG tablet Take 0.5 tablets (5 mg total) by mouth daily. (Patient taking differently: Take 5 mg by mouth daily. Twice a week) 90 tablet 3   amoxicillin (AMOXIL) 500 MG capsule TAKE 4 CAPSULES BY MOUTH 1 HOUR PRIOR TO DENTAL APPOINTMENT (Patient not taking: Reported on 09/28/2018) 12 capsule 1   dapsone 25 MG tablet Take 25 mg by mouth daily.      gabapentin (NEURONTIN) 100 MG capsule Take 100 mg by mouth 2 (two) times daily.      Insulin Glargine, 1 Unit Dial, (TOUJEO SOLOSTAR) 300 UNIT/ML SOPN Inject 24 Units into the skin every morning. Titrate up by 1 unit a day for goal sugars of 100-130 up to 50 units a day 4 pen 11   Insulin Pen Needle (B-D UF III MINI PEN NEEDLES) 31G X 5 MM MISC USE TO ADMINISTER INSULIN FOUR TIMES A DAY DX E11.9 100 each 1   levothyroxine (SYNTHROID, LEVOTHROID) 50 MCG tablet Take 1 tablet (50 mcg total) by mouth daily before breakfast. 30 tablet 0   lisinopril (PRINIVIL,ZESTRIL) 5 MG tablet Take 5 mg by mouth at bedtime.      metoprolol tartrate (LOPRESSOR) 25 MG tablet TAKE 0.5 TABLETS (12.5 MG TOTAL) BY MOUTH 2 (TWO) TIMES DAILY. TAKE 1 TABLET BY MOUTH TWICE A DAY 180 tablet 3   Multiple Vitamins-Minerals (CENTRUM SILVER 50+WOMEN) TABS Take 1 tablet by mouth daily with breakfast.     NON FORMULARY 2 L oxygen at home as needed     [facility] DELICA LANCETS 16X MISC Use to check blood sugars three times a day DX E11.9 100 each 5   ONETOUCH VERIO test strip USE TO TEST 3 TIMES DAILY. DX E11.9 100 each 5   oxyCODONE-acetaminophen (ROXICET) 5-325 MG tablet Take 0.5-1 tablets by mouth every 6 (six) hours as needed for severe pain. 100 tablet 0   predniSONE (DELTASONE) 5 MG tablet Take 5 mg by mouth daily. Pt reports 3 times a week  5   rosuvastatin (CRESTOR) 20 MG tablet Take 1 tablet (20 mg total) by mouth daily. 30 tablet 11    tacrolimus (PROGRAF) 1 MG capsule Take 3 mg by mouth 2 (two) times daily. Take 3 tablets by mouth twice daily      torsemide (DEMADEX) 100 MG tablet Take 1 tablet (100 mg total) by mouth every other day. 30 tablet 5   warfarin (COUMADIN) 5 MG tablet Take 1/2 to 1 tablet by mouth daily as directed 90 tablet 1   No current facility-administered medications for this visit.     SURGICAL HISTORY:  Past Surgical History:  Procedure Laterality Date   AORTIC VALVE REPLACEMENT N/A 04/12/2013   Procedure: AORTIC VALVE REPLACEMENT (AVR);  Surgeon: Rexene Alberts, MD;  Location: [place];  Service: Open Heart Surgery;  Laterality: N/A;   ARTERIOVENOUS  GRAFT PLACEMENT Left    ARTERIOVENOUS GRAFT PLACEMENT Left    "I've had 2 on my left; had one removed" (04/05/2013)    ARTERY EXPLORATION Right 04/11/2013   Procedure: ARTERY EXPLORATION;  Surgeon: Rexene Alberts, MD;  Location: [place];  Service: Open Heart Surgery;  Laterality: Right;  Right carotid artery exploration   AV FISTULA PLACEMENT Right    AV FISTULA REPAIR Right    "took it out" ((/18/2014)   CARDIOVERSION  05/29/2012   Procedure: CARDIOVERSION;  Surgeon: Lelon Perla, MD;  Location: [facility] ENDOSCOPY;  Service: Cardiovascular;  Laterality: N/A;   CHOLECYSTECTOMY  2009   with hernia removal   CORONARY ANGIOPLASTY WITH STENT PLACEMENT Right    coronary artery   INSERTION OF DIALYSIS CATHETER Bilateral    "over the years; took them both out" (04/05/2013)   INTRAOPERATIVE TRANSESOPHAGEAL ECHOCARDIOGRAM N/A 04/11/2013   Procedure: INTRAOPERATIVE TRANSESOPHAGEAL ECHOCARDIOGRAM;  Surgeon: Rexene Alberts, MD;  Location: [place];  Service: Open Heart Surgery;  Laterality: N/A;   INTRAOPERATIVE TRANSESOPHAGEAL ECHOCARDIOGRAM N/A 04/12/2013   Procedure: INTRAOPERATIVE TRANSESOPHAGEAL ECHOCARDIOGRAM;  Surgeon: Rexene Alberts, MD;  Location: [place];  Service: Open Heart Surgery;  Laterality: N/A;   KIDNEY TRANSPLANT  03/16/11   LEFT AND  RIGHT HEART CATHETERIZATION WITH CORONARY ANGIOGRAM N/A 04/06/2013   Procedure: LEFT AND RIGHT HEART CATHETERIZATION WITH CORONARY ANGIOGRAM;  Surgeon: Blane Ohara, MD;  Location: [facility] CATH LAB;  Service: Cardiovascular;  Laterality: N/A;   MAZE N/A 04/12/2013   Procedure: MAZE;  Surgeon: Rexene Alberts, MD;  Location: [place];  Service: Open Heart Surgery;  Laterality: N/A;   NASAL RECONSTRUCTION WITH SEPTAL REPAIR     "took it out" (04/05/2013)   NEPHRECTOMY Left 2010   no CA on bx   TONSILLECTOMY     TOTAL ABDOMINAL HYSTERECTOMY     TUBAL LIGATION      REVIEW OF SYSTEMS:  A comprehensive review of systems was negative except for: Constitutional: positive for fatigue Respiratory: positive for dyspnea on exertion   PHYSICAL EXAMINATION: General appearance: alert, cooperative and no distress Head: Normocephalic, without obvious abnormality, atraumatic Neck: no adenopathy, no JVD, supple, symmetrical, trachea midline and thyroid not enlarged, symmetric, no tenderness/mass/nodules Lymph nodes: Cervical, supraclavicular, and axillary nodes normal. Resp: clear to auscultation bilaterally Back: symmetric, no curvature. ROM normal. No CVA tenderness. Cardio: regular rate and rhythm, S1, S2 normal, no murmur, click, rub or gallop GI: soft, non-tender; bowel sounds normal; no masses,  no organomegaly Extremities: edema 1+ edema bilaterally  ECOG PERFORMANCE STATUS: 1 - Symptomatic but completely ambulatory  Blood pressure (!) 151/74, pulse 72, temperature 98.5 F (36.9 C), temperature source Oral, resp. rate 20, height 5\' 1"  (1.549 m), weight 185 lb 3.2 oz (84 kg), SpO2 96 %.  LABORATORY DATA: Lab Results  Component Value Date   WBC 7.0 12/08/2018   HGB 12.8 12/08/2018   HCT 43.4 12/08/2018   MCV 82.8 12/08/2018   PLT 207 12/08/2018      Chemistry      Component Value Date/Time   NA 144 12/08/2018 0958   K 3.9 12/08/2018 0958   CL 105 12/08/2018 0958   CO2 31 12/08/2018  0958   BUN 36 (H) 12/08/2018 0958   CREATININE 1.92 (H) 12/08/2018 0958   CREATININE 1.35 (H) 03/30/2013 1634      Component Value Date/Time   CALCIUM 10.1 12/08/2018 0958   CALCIUM 9.7 01/29/2008 1338   ALKPHOS 128 (H) 12/08/2018 7564  AST 23 12/08/2018 0958   ALT 15 12/08/2018 0958   BILITOT 0.4 12/08/2018 0958       RADIOGRAPHIC STUDIES: Ct Chest Wo Contrast  Result Date: 12/05/2018 CLINICAL DATA:  Stage I right upper well differentiated lung adenocarcinoma diagnosed May 2019 completed radiation therapy 12/29/2017. Interval observation. Restaging. EXAM: CT CHEST WITHOUT CONTRAST TECHNIQUE: Multidetector CT imaging of the chest was performed following the standard protocol without IV contrast. COMPARISON:  06/06/2018 chest CT. FINDINGS: Cardiovascular: Moderate cardiomegaly. No significant pericardial effusion/thickening. Left main and 3 vessel coronary atherosclerosis. Aortic valve prosthesis is in place. Atherosclerotic nonaneurysmal thoracic aorta. Main pulmonary artery (3.6 cm diameter), stable. Mediastinum/Nodes: Coarse calcified 1.0 cm right thyroid lobe nodule, stable. Unremarkable esophagus. No pathologically enlarged axillary, mediastinal or hilar lymph nodes, noting limited sensitivity for the detection of hilar adenopathy on this noncontrast study. Lungs/Pleura: No pneumothorax. Small right and trace left dependent pleural effusions. Sharply marginated patchy consolidation and ground-glass opacity in the apically right upper lobe with interval increased consolidation and increased volume loss and distortion, favor evolving postradiation change. Posterior right upper lobe 0.8 cm ground-glass pulmonary nodule (series 7/image 50) is stable. Subsolid 1.9 x 1.1 cm opacity in the basilar right lower lobe is somewhat bandlike in configuration (series 7/image 107), unchanged. Subsolid 0.7 cm superior segment left lower lobe pulmonary nodule (series 7/image 59), previously 0.7 cm using  similar measurement technique, stable. No acute consolidative airspace disease or new significant pulmonary nodules. Interlobular septal thickening throughout both lungs is mildly increased. Upper abdomen: Cholecystectomy. Trace upper abdominal ascites. Atrophic visualized right kidney with simple 3.0 cm right renal cyst. Stable 1.8 cm right adrenal nodule with density 17 HU non hypermetabolic on prior PET-CT, most compatible with a benign adenoma. Musculoskeletal: No aggressive appearing focal osseous lesions. Increased skin thickening throughout the rest bilaterally. Intact sternotomy wires. Marked thoracic spondylosis. IMPRESSION: 1. Spectrum of findings in the apical right upper lobe favored to represent evolving postradiation change. Continued chest CT surveillance advised. 2. Additional scattered bilateral ground-glass and subsolid pulmonary nodules are stable in the interval. No findings worrisome for new or progressive metastatic disease in the chest. 3. Spectrum of findings suggestive of congestive heart failure including cardiomegaly, small right and trace left pleural effusions, increased interlobular septal thickening in the lungs suggesting pulmonary edema, trace upper abdominal ascites and increased skin thickening throughout the breasts suggesting body wall edema. Aortic Atherosclerosis (ICD10-I70.0). Electronically Signed   By: Ilona Sorrel M.D.   On: 12/05/2018 09:15   Mm Diag Breast Tomo Bilateral  Result Date: 12/05/2018 CLINICAL DATA:  Patient describes bilateral breast heaviness and pain. History of CHF, hypertension, coronary artery stent. EXAM: DIGITAL DIAGNOSTIC BILATERAL MAMMOGRAM WITH CAD AND TOMO COMPARISON:  Previous exam(s). ACR Breast Density Category c: The breast tissue is heterogeneously dense, which may obscure small masses. FINDINGS: There is diffuse bilateral trabecular thickening and diffuse bilateral skin thickening compatible with benign breast edema. There are no dominant  masses, suspicious calcifications or secondary signs of malignancy within either breast. Mammographic images were processed with CAD. IMPRESSION: 1. Benign bilateral breast edema, indicating a significant fluid volume overload. This is a significant change from patient's earlier screening mammogram of 05/23/2018 which was without any appreciable edema. 2. No evidence of malignancy appreciated within either breast. RECOMMENDATION: 1. Clinical follow-up for the diffuse bilateral breast edema indicating a significant fluid volume overload. Patient also describes edema within her LEFT arm. Patient describes her breathing as near her normal baseline today, however, patient was encouraged to  contact the office of her primary care in the next day or 2 to discuss possible treatment for the apparent fluid overload. 2. Follow-up bilateral diagnostic mammogram in 3 months if fluid issues can be corrected, as the diffuse bilateral breast edema decreases the sensitivity for breast cancer detection. Patient has been tentatively scheduled for that follow-up diagnostic exam. These results were called by telephone at the time of interpretation on 12/05/2018 at 3:10 pm to Dr. Earlie Server assistant, Inez Catalina, who verbally acknowledged these results. I have discussed the findings and recommendations with the patient. Results were also provided in writing at the conclusion of the visit. If applicable, a reminder letter will be sent to the patient regarding the next appointment. BI-RADS CATEGORY  2: Benign. Electronically Signed   By: Franki Cabot M.D.   On: 12/05/2018 15:48    ASSESSMENT AND PLAN: This is a very pleasant 73 years old African-American female with history of stage IA non-small cell lung cancer status post curative stereotactic radiotherapy to the right upper lobe lung nodule in June 2019. The patient is currently on observation and she is feeling fine except for the baseline shortness of breath and swelling from  the congestive heart failure. She had repeat CT scan of the chest performed recently.  I personally and independently reviewed the scans and discussed the results with the patient today. Her scan showed no concerning findings for disease recurrence or progression. I recommended for the patient to continue on observation with repeat CT scan of the chest without contrast in 6 months. For the congestive heart failure and hypertension, the patient will need close follow-up by her primary care physician and cardiologist for adjustment of her medication. She was advised to call immediately if she has any concerning symptoms in the interval. The patient voices understanding of current disease status and treatment options and is in agreement with the current care plan.  All questions were answered. The patient knows to call the clinic with any problems, questions or concerns. We can certainly see the patient much sooner if necessary.  I spent 10 minutes counseling the patient face to face. The total time spent in the appointment was 15 minutes.  Disclaimer: This note was dictated with voice recognition software. Similar sounding words can inadvertently be transcribed and may not be corrected upon review.

## 2018-12-13 ENCOUNTER — Telehealth: Payer: Self-pay | Admitting: Pharmacist

## 2018-12-13 NOTE — Telephone Encounter (Signed)
LMOM - need to move Coumadin appt from [facility] to [facility] schedule for next week.

## 2018-12-14 ENCOUNTER — Telehealth: Payer: Self-pay | Admitting: Internal Medicine

## 2018-12-14 ENCOUNTER — Telehealth: Payer: Self-pay | Admitting: Cardiology

## 2018-12-14 NOTE — Telephone Encounter (Signed)
Message received from Triage. I reviewed the patient chart and returned her call.  Her breasts became very heavy two weeks ago. She had a mammogram which showed fluid. She also had swelling in her legs for two weeks. She is also occasionally SOB, but she is on home oxygen - this sounds like her normal SOB. She has 3 pillow orthopnea, which is her baseline. She denies abdominal fullness. Her right leg is swollen from knee down. Left leg is only mildly swollen. Her left arm is swollen - this is the arm with the fistula. She is s/p kidney transplant in 2012. Her nephrologist is Dr. Marylou Flesher. She states she is compliant with a low sodium diet.   She states her PCP Dr. Alain Marion instructed her to decrease her torsemide to every other day. She believes the swelling presented after she made this change.   It is likely this decrease in diuretic regimen has caused her swelling. Given that she sees nephrology with a recent sCr of 1.92 (12/08/18) and her PCP has directed her diuretic, I encouraged her to reach out to her PCP. It is unclear why her diuretic dose was changed. If this was due to renal function, I will defer to PCP and nephrology to direct her diuretic regimen.   She is currently taking 100 mg torsemide every other day. She states she was previously taking 100 mg torsemide every day. Last clinic note appeared like she was taking 60 mg torsemide BID.  We will see her in clinic, but I also asked her to call her PCP today who made the change.    Ledora Bottcher, PA-C 12/14/2018, 11:02 AM [facility] [address] [place], [place] [address]

## 2018-12-14 NOTE — Telephone Encounter (Signed)
Pt c/o swelling: STAT is pt has developed SOB within 24 hours  1) How much weight have you gained and in what time span? 15 lbs in about two weeks  2) If swelling, where is the swelling located? Legs, feet, arms  3) Are you currently taking a fluid pill? yes  4) Are you currently SOB? She was, but she is on O2  5) Do you have a log of your daily weights (if so, list)? no  6) Have you gained 3 pounds in a day or 5 pounds in a week? She states she gained 5lbs in a week.   7) Have you traveled recently? no

## 2018-12-14 NOTE — Telephone Encounter (Signed)
Scheduled appt per 5/26 los - lab and f/u in 6 months. - reminder letter mailed with appt date and time

## 2018-12-14 NOTE — Telephone Encounter (Signed)
Called and spke with Dr Plotnikoff's assistant she states that pt's last visit was 11-22-2018. Reviewed note says: Continue with torsemide use as needed. Please review note.

## 2018-12-14 NOTE — Telephone Encounter (Signed)
Pt has also contacted cardiology and is seeing then Tuesday.

## 2018-12-14 NOTE — Telephone Encounter (Signed)
Returned call to patient she stated for the past 2 weeks she has increased swelling in both feet,legs,hands.Stated she has gained 10 lbs within 2 weeks.She takes Torsemide 100 mg every other day.Stated she is not sob.Advised I will send message to Fabian Sharp PA for advice.

## 2018-12-14 NOTE — Telephone Encounter (Signed)
Spoke to patient office appointment scheduled with [name] Sims DNP Tue 6/2 at 1:45 pm.INR appointment changed to 6/2 at 2:15 pm.[name] advised she will come to your room to do INR after your visit with Curt Bears.

## 2018-12-14 NOTE — Telephone Encounter (Addendum)
Patient called stating that she is still having swelling and pain in her left arm. Her right leg and both feet are very swollen. She also mentioned that her breast are still swollen as well.  [facility]  Please advise.

## 2018-12-14 NOTE — Telephone Encounter (Signed)
I agree w/Cardiology consultation Thx

## 2018-12-15 ENCOUNTER — Telehealth: Payer: Self-pay

## 2018-12-15 NOTE — Telephone Encounter (Signed)
Unable to lmom for prescreen  

## 2018-12-15 NOTE — Telephone Encounter (Signed)
Thank you. Will assess her on visit. If symptoms worsen she is to call us back.

## 2018-12-15 NOTE — Telephone Encounter (Signed)
Pt.notified

## 2018-12-17 ENCOUNTER — Other Ambulatory Visit: Payer: Self-pay | Admitting: Internal Medicine

## 2018-12-18 DIAGNOSIS — I509 Heart failure, unspecified: Secondary | ICD-10-CM

## 2018-12-18 HISTORY — DX: Heart failure, unspecified: I50.9

## 2018-12-18 NOTE — Progress Notes (Signed)
Cardiology Office Note   Date:  12/19/2018   ID:  [name], DOB 1946-05-12, MRN [id number]  PCP:  [name], MD  Cardiologist:  [facility]   Chief Complaint  Patient presents with   Edema   Shortness of Breath     History of Present Illness: [name] is a 73 y.o. female with a PMH of CAD s/p PCI of the RCA in 2003,  atrial fibrillation on coumadin, severe AS s/p AVR with pericardial tissue valve and MAZE procedure in 2014, HTN, chronic diastolic CHF, HLD, PAD followed by Dr. Fletcher Anon, ESRD s/p renal transplant in 2012 with AV fistula in place, and adenocarcinoma of the lung 11/2017 s/p radiation, who presents today for evaluation the evaluation of LE edema  She was last seen in the office by Dr.Crenshaw on 05/09/2018 and was stable from a cardiac standpoint.  She called our office on 12/14/2018 with complaints of fluid retention and shortness of breath worsening over the last two weeks. She had 3 pillow orthopnea. She had recent decrease in torsemide dose by PCP which she believes caused her to have an increase in her swelling. She is followed by nephrology with recent creatinine of 1.92 on 12/08/2018. She is on torsemide 100 mg every other day, but was previously taking it 20mg  BID. She was asked to call her PCP for recommendations as they made the changes in her regimen. PCP requested that cardiology see her.   She presents today with ongoing issues with fluid retention and SOB. She reports that her weight is up ~15lbs over the last 2 months. She has had several dose adjustments of her torsemide over the past two months but she is unclear of the timeline. She reported doing well a few months ago, at which time her torsemide was decreased to QOD. ~1 month ago she reported some fluid retention. She subsequently developed left breast pain and underwent a mammogram which revealed breast edema indicating significant volume overload. She notes significant left arm swelling and b/l LE  edema. Her torsemide was then increased to 100mg  daily. She states for the past week, given little improvement in swelling, she has been taking 100mg  torsemide BID. This has improved her swelling, though it is still present. She reports increased O2 demand and SOB over the past couple weeks, for which she has been using her prn O2 via  more frequently. She reports 3 pillow orthopnea. No complaints of chest pain. She denies prior breast surgery or lymph node involvement with her lung cancer. She denies dietary indiscretion. She follows with Dr. Joelyn Oms for nephrology care but has not seen him in the past 2 months. No complaints of bleeding with coumadin therapy.   Past Medical History:  Diagnosis Date   Adenocarcinoma of lung, stage 1, right ([place]) 11/25/2017   Anemia, iron deficiency    of chronic disease   Aortic stenosis    a. Severe AS by echo 11/2012.   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 During ENT surgery 2005: developed marked asystole requiring CPR, felt due to vagal reaction (cath nonobst dz).   Carotid artery disease ([place])    a. Carotid Dopplers performed in August 2013 showed 40-59% left stenosis and 0-39% right; f/u recommended in 2 years.    Cerebrovascular accident [facility]) 2009   a. LMCA infarct felt embolic 0109, maintained on chronic coumadin.; denies residual on 04/05/2013   CHF (congestive heart failure) ([place])  Cholelithiasis    Chronic cough onset 03/2010   Dr Melvyn Novas   Chronic Persistent Atrial Fibrillation 12/31/2008   Qualifier: Diagnosis of  By: [name]     Coronary artery disease 05/2002   a. Ant MI 2003 s/p PTCA/stent to RCA.    Diverticulosis of colon    Esophagitis, reflux    ESRD (end stage renal disease) ([place])    a. Mass on L kidney per pt s/p nephrectomy - pt states not cancer - [facility] notes indicate ESRD due to HTN/DM - was previously on HD. b. Kidney transplant 02/2011.   GERD (gastroesophageal reflux  disease)    Gout    Helicobacter pylori (H. pylori) infection    hx of   Hemorrhoids    Hx of colonic polyps    adenomatous   Hyperlipidemia    Hypertension    Lung nodule seen on imaging study 04/07/2013   1.0 cm ground glass opacity RUL   Myocardial infarction [facility]) 2003   Pericardial effusion    a. Small by echo 11/2011.   Pruritic dermatitis    treated with steriods/UV light   S/P aortic valve replacement with bioprosthetic valve and maze procedure 04/12/2013   62mm [facility] Ease bovine pericardial tissue valve    S/P Maze operation for atrial fibrillation 04/12/2013   Complete bilateral atrial lesion set using bipolar radiofrequency and cryothermy ablation with clipping of LA appendage   Sleep apnea    Pt says testing was positive, intolerant of CPAP.   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   Type II diabetes mellitus ([place])    Unspecified hearing loss     Past Surgical History:  Procedure Laterality Date   AORTIC VALVE REPLACEMENT N/A 04/12/2013   Procedure: AORTIC VALVE REPLACEMENT (AVR);  Surgeon: Rexene Alberts, MD;  Location: [place];  Service: Open Heart Surgery;  Laterality: N/A;   ARTERIOVENOUS GRAFT PLACEMENT Left    ARTERIOVENOUS GRAFT PLACEMENT Left    "I've had 2 on my left; had one removed" (04/05/2013)    ARTERY EXPLORATION Right 04/11/2013   Procedure: ARTERY EXPLORATION;  Surgeon: Rexene Alberts, MD;  Location: [place];  Service: Open Heart Surgery;  Laterality: Right;  Right carotid artery exploration   AV FISTULA PLACEMENT Right    AV FISTULA REPAIR Right    "took it out" ((/18/2014)   CARDIOVERSION  05/29/2012   Procedure: CARDIOVERSION;  Surgeon: Lelon Perla, MD;  Location: [facility] ENDOSCOPY;  Service: Cardiovascular;  Laterality: N/A;   CHOLECYSTECTOMY  2009   with  hernia removal   CORONARY ANGIOPLASTY WITH STENT PLACEMENT Right    coronary artery   INSERTION OF DIALYSIS CATHETER Bilateral    "over the years; took them both out" (04/05/2013)   INTRAOPERATIVE TRANSESOPHAGEAL ECHOCARDIOGRAM N/A 04/11/2013   Procedure: INTRAOPERATIVE TRANSESOPHAGEAL ECHOCARDIOGRAM;  Surgeon: Rexene Alberts, MD;  Location: [place];  Service: Open Heart Surgery;  Laterality: N/A;   INTRAOPERATIVE TRANSESOPHAGEAL ECHOCARDIOGRAM N/A 04/12/2013   Procedure: INTRAOPERATIVE TRANSESOPHAGEAL ECHOCARDIOGRAM;  Surgeon: Rexene Alberts, MD;  Location: [place];  Service: Open Heart Surgery;  Laterality: N/A;   KIDNEY TRANSPLANT  03/16/11   LEFT AND RIGHT HEART CATHETERIZATION WITH CORONARY ANGIOGRAM N/A 04/06/2013   Procedure: LEFT AND RIGHT HEART CATHETERIZATION WITH CORONARY ANGIOGRAM;  Surgeon: Blane Ohara, MD;  Location: [facility] CATH LAB;  Service: Cardiovascular;  Laterality:  N/A;   MAZE N/A 04/12/2013   Procedure: MAZE;  Surgeon: Rexene Alberts, MD;  Location: [place];  Service: Open Heart Surgery;  Laterality: N/A;   NASAL RECONSTRUCTION WITH SEPTAL REPAIR     "took it out" (04/05/2013)   NEPHRECTOMY Left 2010   no CA on bx   TONSILLECTOMY     TOTAL ABDOMINAL HYSTERECTOMY     TUBAL LIGATION       Current Outpatient Medications  Medication Sig Dispense Refill   amLODipine (NORVASC) 10 MG tablet Take 0.5 tablets (5 mg total) by mouth daily. (Patient taking differently: Take 5 mg by mouth daily. Twice a week) 90 tablet 3   amoxicillin (AMOXIL) 500 MG capsule TAKE 4 CAPSULES BY MOUTH 1 HOUR PRIOR TO DENTAL APPOINTMENT 12 capsule 1   dapsone 25 MG tablet Take 25 mg by mouth daily.      gabapentin (NEURONTIN) 100 MG capsule Take 100 mg by mouth 2 (two) times daily.      Insulin Glargine, 1 Unit Dial, (TOUJEO SOLOSTAR) 300 UNIT/ML SOPN Inject 24 Units into the skin every morning. Titrate up by 1 unit a day for goal sugars of 100-130 up to 50 units a day 4 pen 11   Insulin  Pen Needle (B-D UF III MINI PEN NEEDLES) 31G X 5 MM MISC USE TO ADMINISTER INSULIN FOUR TIMES A DAY DX E11.9 100 each 1   levothyroxine (SYNTHROID) 25 MCG tablet TAKE 2 TABLETS (50 MCG TOTAL) BY MOUTH DAILY. 180 tablet 3   lisinopril (PRINIVIL,ZESTRIL) 5 MG tablet Take 5 mg by mouth at bedtime.      metoprolol tartrate (LOPRESSOR) 25 MG tablet TAKE 0.5 TABLETS (12.5 MG TOTAL) BY MOUTH 2 (TWO) TIMES DAILY. TAKE 1 TABLET BY MOUTH TWICE A DAY 180 tablet 3   Multiple Vitamins-Minerals (CENTRUM SILVER 50+WOMEN) TABS Take 1 tablet by mouth daily with breakfast.     NON FORMULARY 2 L oxygen at home as needed     [facility] DELICA LANCETS 16X MISC Use to check blood sugars three times a day DX E11.9 100 each 5   ONETOUCH VERIO test strip USE TO TEST 3 TIMES DAILY. DX E11.9 100 each 5   predniSONE (DELTASONE) 5 MG tablet Take 5 mg by mouth daily. Pt reports 3 times a week  5   rosuvastatin (CRESTOR) 20 MG tablet Take 1 tablet (20 mg total) by mouth daily. 30 tablet 11   tacrolimus (PROGRAF) 1 MG capsule Take 3 mg by mouth 2 (two) times daily. Take 3 tablets by mouth twice daily      torsemide (DEMADEX) 100 MG tablet Take 100 mg by mouth 2 (two) times a day.     warfarin (COUMADIN) 5 MG tablet Take 1/2 to 1 tablet by mouth daily as directed 90 tablet 1   No current facility-administered medications for this visit.     Allergies:   Ibuprofen; Sulfamethoxazole-trimethoprim; Sulfonamide derivatives; Tape; Tramadol; Doxycycline; Hydrocil [psyllium]; Bactrim; and Red dye    Social History:  The patient  reports that she quit smoking about 17 years ago. Her smoking use included cigarettes. She has a 30.00 pack-year smoking history. She has never used smokeless tobacco. She reports that she does not drink alcohol or use drugs.   Family History:  The patient's family history includes Diabetes in her maternal grandmother, son, and another family member; Hypertension in her mother; Stroke in her father.      ROS: All other systems are reviewed and negative. Unless  otherwise mentioned in H&P    PHYSICAL EXAM: VS:  BP (!) 154/68    Pulse (!) 53    Temp (!) 97.2 F (36.2 C)    Ht 5\' 1"  (1.549 m)    Wt 188 lb 6.4 oz (85.5 kg)    BMI 35.60 kg/m  , BMI Body mass index is 35.6 kg/m. GEN: Well nourished, well developed, in no acute distress HEENT: sclera anicteric Cardiac: RRR; +murmur, no rubs or gallops, significant LUE edema with increased warmth with pitting edema in forearm and hand; 2+ b/l LE edema  Respiratory:  Faint crackles at lung bases GI: soft, obese, nontender, nondistended, + BS Vasc: fistula in LUE with palpable thrill Skin: warm and dry, no rash Neuro:  Strength and sensation are intact Psych: euthymic mood, full affect   EKG:  EKG is ordered today. The ekg ordered today demonstrates sinus bradycardia with PAC, rate 53, QTC 422, TWI in I and aVL (seen on previous)   Recent Labs: 02/03/2018: B Natriuretic Peptide 1,181.3 02/08/2018: Magnesium 2.0 12/08/2018: ALT 15; BUN 36; Creatinine 1.92; Hemoglobin 12.8; Platelet Count 207; Potassium 3.9; Sodium 144    Lipid Panel    Component Value Date/Time   CHOL 155 03/24/2018 1510   TRIG 91 03/24/2018 1510   TRIG 121 06/13/2006 1525   HDL 64 03/24/2018 1510   CHOLHDL 2.4 03/24/2018 1510   CHOLHDL 2 11/18/2016 1354   VLDL 26.2 11/18/2016 1354   LDLCALC 73 03/24/2018 1510   LDLDIRECT 89.3 06/13/2006 1525      Wt Readings from Last 3 Encounters:  12/19/18 188 lb 6.4 oz (85.5 kg)  12/12/18 185 lb 3.2 oz (84 kg)  09/28/18 159 lb (72.1 kg)      Other studies Reviewed: Additional studies/ records that were reviewed today include:   Echocardiogram 11/2017: Study Conclusions  - Left ventricle: The cavity size was normal. Wall thickness was   normal. Systolic function was normal. The estimated ejection   fraction was in the range of 60% to 65%. - Aortic valve: AV prosthesis is difficult to see Appear to flutter   with  opening Peak and mean gradients are 52 and 28 mm Hg   respectively Compared to echo reprot from 2018 mean gradient is   mildly increasd. - Left atrium: The atrium was severely dilated. - Right atrium: The atrium was severely dilated. - Pulmonary arteries: PA peak pressure: 70 mm Hg (S).  NST 11/2017:  Nuclear stress EF: 59%.  No T wave inversion was noted during stress.  There was no ST segment deviation noted during stress.  This is a low risk study.   No reversible perfusion defects. LVEF 59% with normal wall motion. This is a low risk study.  Left heart catheterization 2014: Final Conclusions:   1. Patent left main, LAD, and left circumflex, with mild nonobstructive disease of these vessels 2. 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 Heavily calcified aortic valve under fluoroscopic evaluation in this patient with known severe aortic stenosis 4. Pulmonary hypertension, suspect secondary to left heart disease (transpulmonic gradient less than 10 mm mercury)  Recommendations:  Cardiac surgery evaluation  ASSESSMENT AND PLAN:  1. Acute on chronic diastolic CHF: she appears significantly volume overloaded on exam today despite taking 100mg  BID of torsemide for the past week. She reports UOP is not as much as she would have expected with increase dosing. She is s/p renal transplant with last  Cr 1.9 12/08/2018.  - Recommend patient present to the ER for IV diuresis. Given unknown renal function at this time, do not feel safe uptitrating her diuretic in the outpatient setting given renal transplant status and the degree of volume overload - If Cr stable, could consider addition of metolazone  2. LUE swelling: LUE is markedly edematous with little to no swelling in the RUE. She reports this started ~1-2 weeks ago and she noticed tenderness near her fistula. Her arm is mildly warm to the touch. - Recommend patient present to  the ER to r/o DVT  3. HTN: BP elevated today. Would not uptitrate amlodipine given significant edema on exam today. Would not uptitrate lisinopril given CKD. Unable to uptitrate metoprolol given bradycardia.  - Could consider adding hydralazine if BP continues to be poorly controlled despite diuresis.   4. Paroxysmal atrial fibrillation s/p MAZE 2014: EKG with sinus brady today with PAC. She is on coumadin given prior stroke history. INR today is 1.6 and she was seen by the coumadin clinic with recommendations documented. - Continue metoprolol - Continue coumadin  5. CAD s/p PCI to RCA: occluded RCA on cath in 2014 with collaterals present. She is not on aspirin due to anticoagulation needs. No complaints of chest pain or exertional SOB.  - Continue statin - Continue metoprolol   6. Aortic stenosis s/p aortic valve replacement in 2014: aortic valve appeared stable on echo 11/2017 with mildly increased mean gradient.  - If admitted, consider repeat echocardiogram for continue monitoring   7. PVD: followed by Dr. Fletcher Anon - Continue statin   Current medicines are reviewed at length with the patient today.    Labs/ tests ordered today include: None  Recommend follow-up in 1 week after evaluation at [facility].   Abigail Butts, PA-C     12/19/2018 4:29 PM    [place] [address] Office [phone] Fax [phone]

## 2018-12-19 ENCOUNTER — Inpatient Hospital Stay (HOSPITAL_COMMUNITY)
Admission: EM | Admit: 2018-12-19 | Discharge: 2018-12-26 | DRG: 291 | Disposition: A | Discharge status: Home / Self Care | Payer: Medicare Other | Source: Ambulatory Visit | Attending: Internal Medicine | Admitting: Internal Medicine

## 2018-12-19 ENCOUNTER — Emergency Department (HOSPITAL_COMMUNITY): Payer: Medicare Other

## 2018-12-19 ENCOUNTER — Other Ambulatory Visit: Payer: Self-pay

## 2018-12-19 ENCOUNTER — Ambulatory Visit (INDEPENDENT_AMBULATORY_CARE_PROVIDER_SITE_OTHER): Payer: Medicare Other | Admitting: Medical

## 2018-12-19 ENCOUNTER — Ambulatory Visit (INDEPENDENT_AMBULATORY_CARE_PROVIDER_SITE_OTHER): Payer: Medicare Other | Admitting: Pharmacist

## 2018-12-19 ENCOUNTER — Encounter: Payer: Self-pay | Admitting: Medical

## 2018-12-19 VITALS — BP 154/68 | HR 53 | Temp 97.2°F | Ht 61.0 in | Wt 188.4 lb

## 2018-12-19 DIAGNOSIS — Z9111 Patient's noncompliance with dietary regimen: Secondary | ICD-10-CM

## 2018-12-19 DIAGNOSIS — I340 Nonrheumatic mitral (valve) insufficiency: Secondary | ICD-10-CM | POA: Diagnosis not present

## 2018-12-19 DIAGNOSIS — D509 Iron deficiency anemia, unspecified: Secondary | ICD-10-CM | POA: Diagnosis present

## 2018-12-19 DIAGNOSIS — E876 Hypokalemia: Secondary | ICD-10-CM | POA: Diagnosis present

## 2018-12-19 DIAGNOSIS — Z5181 Encounter for therapeutic drug level monitoring: Secondary | ICD-10-CM | POA: Diagnosis not present

## 2018-12-19 DIAGNOSIS — I252 Old myocardial infarction: Secondary | ICD-10-CM | POA: Diagnosis not present

## 2018-12-19 DIAGNOSIS — Z6832 Body mass index (BMI) 32.0-32.9, adult: Secondary | ICD-10-CM

## 2018-12-19 DIAGNOSIS — N183 Chronic kidney disease, stage 3 unspecified: Secondary | ICD-10-CM | POA: Diagnosis present

## 2018-12-19 DIAGNOSIS — I69320 Aphasia following cerebral infarction: Secondary | ICD-10-CM | POA: Diagnosis not present

## 2018-12-19 DIAGNOSIS — J9621 Acute and chronic respiratory failure with hypoxia: Secondary | ICD-10-CM | POA: Diagnosis present

## 2018-12-19 DIAGNOSIS — E1165 Type 2 diabetes mellitus with hyperglycemia: Secondary | ICD-10-CM | POA: Diagnosis present

## 2018-12-19 DIAGNOSIS — Z79890 Hormone replacement therapy (postmenopausal): Secondary | ICD-10-CM

## 2018-12-19 DIAGNOSIS — I361 Nonrheumatic tricuspid (valve) insufficiency: Secondary | ICD-10-CM | POA: Diagnosis not present

## 2018-12-19 DIAGNOSIS — Z886 Allergy status to analgesic agent status: Secondary | ICD-10-CM

## 2018-12-19 DIAGNOSIS — E669 Obesity, unspecified: Secondary | ICD-10-CM | POA: Diagnosis not present

## 2018-12-19 DIAGNOSIS — I10 Essential (primary) hypertension: Secondary | ICD-10-CM

## 2018-12-19 DIAGNOSIS — E1121 Type 2 diabetes mellitus with diabetic nephropathy: Secondary | ICD-10-CM | POA: Diagnosis not present

## 2018-12-19 DIAGNOSIS — D631 Anemia in chronic kidney disease: Secondary | ICD-10-CM | POA: Diagnosis present

## 2018-12-19 DIAGNOSIS — M7989 Other specified soft tissue disorders: Secondary | ICD-10-CM | POA: Diagnosis not present

## 2018-12-19 DIAGNOSIS — D849 Immunodeficiency, unspecified: Secondary | ICD-10-CM | POA: Diagnosis present

## 2018-12-19 DIAGNOSIS — Z885 Allergy status to narcotic agent status: Secondary | ICD-10-CM

## 2018-12-19 DIAGNOSIS — Y92230 Patient room in hospital as the place of occurrence of the external cause: Secondary | ICD-10-CM | POA: Diagnosis not present

## 2018-12-19 DIAGNOSIS — E039 Hypothyroidism, unspecified: Secondary | ICD-10-CM | POA: Diagnosis present

## 2018-12-19 DIAGNOSIS — R0602 Shortness of breath: Secondary | ICD-10-CM

## 2018-12-19 DIAGNOSIS — Z955 Presence of coronary angioplasty implant and graft: Secondary | ICD-10-CM

## 2018-12-19 DIAGNOSIS — C3490 Malignant neoplasm of unspecified part of unspecified bronchus or lung: Secondary | ICD-10-CM | POA: Diagnosis not present

## 2018-12-19 DIAGNOSIS — I272 Pulmonary hypertension, unspecified: Secondary | ICD-10-CM | POA: Diagnosis present

## 2018-12-19 DIAGNOSIS — E1122 Type 2 diabetes mellitus with diabetic chronic kidney disease: Secondary | ICD-10-CM | POA: Diagnosis present

## 2018-12-19 DIAGNOSIS — Z85528 Personal history of other malignant neoplasm of kidney: Secondary | ICD-10-CM

## 2018-12-19 DIAGNOSIS — I110 Hypertensive heart disease with heart failure: Secondary | ICD-10-CM | POA: Diagnosis not present

## 2018-12-19 DIAGNOSIS — I480 Paroxysmal atrial fibrillation: Secondary | ICD-10-CM | POA: Diagnosis present

## 2018-12-19 DIAGNOSIS — Z8249 Family history of ischemic heart disease and other diseases of the circulatory system: Secondary | ICD-10-CM

## 2018-12-19 DIAGNOSIS — R2232 Localized swelling, mass and lump, left upper limb: Secondary | ICD-10-CM | POA: Diagnosis present

## 2018-12-19 DIAGNOSIS — Z20828 Contact with and (suspected) exposure to other viral communicable diseases: Secondary | ICD-10-CM | POA: Diagnosis present

## 2018-12-19 DIAGNOSIS — I2510 Atherosclerotic heart disease of native coronary artery without angina pectoris: Secondary | ICD-10-CM

## 2018-12-19 DIAGNOSIS — I4819 Other persistent atrial fibrillation: Secondary | ICD-10-CM | POA: Diagnosis present

## 2018-12-19 DIAGNOSIS — E1129 Type 2 diabetes mellitus with other diabetic kidney complication: Secondary | ICD-10-CM | POA: Diagnosis present

## 2018-12-19 DIAGNOSIS — T502X5A Adverse effect of carbonic-anhydrase inhibitors, benzothiadiazides and other diuretics, initial encounter: Secondary | ICD-10-CM | POA: Diagnosis not present

## 2018-12-19 DIAGNOSIS — Z952 Presence of prosthetic heart valve: Secondary | ICD-10-CM | POA: Diagnosis not present

## 2018-12-19 DIAGNOSIS — I509 Heart failure, unspecified: Secondary | ICD-10-CM | POA: Diagnosis not present

## 2018-12-19 DIAGNOSIS — E785 Hyperlipidemia, unspecified: Secondary | ICD-10-CM | POA: Diagnosis present

## 2018-12-19 DIAGNOSIS — Z7901 Long term (current) use of anticoagulants: Secondary | ICD-10-CM | POA: Diagnosis not present

## 2018-12-19 DIAGNOSIS — Z85118 Personal history of other malignant neoplasm of bronchus and lung: Secondary | ICD-10-CM

## 2018-12-19 DIAGNOSIS — Z940 Kidney transplant status: Secondary | ICD-10-CM

## 2018-12-19 DIAGNOSIS — J9601 Acute respiratory failure with hypoxia: Secondary | ICD-10-CM | POA: Diagnosis present

## 2018-12-19 DIAGNOSIS — Z953 Presence of xenogenic heart valve: Secondary | ICD-10-CM | POA: Diagnosis not present

## 2018-12-19 DIAGNOSIS — E0865 Diabetes mellitus due to underlying condition with hyperglycemia: Secondary | ICD-10-CM | POA: Diagnosis present

## 2018-12-19 DIAGNOSIS — I4891 Unspecified atrial fibrillation: Secondary | ICD-10-CM

## 2018-12-19 DIAGNOSIS — I5033 Acute on chronic diastolic (congestive) heart failure: Secondary | ICD-10-CM | POA: Diagnosis not present

## 2018-12-19 DIAGNOSIS — G4730 Sleep apnea, unspecified: Secondary | ICD-10-CM | POA: Diagnosis present

## 2018-12-19 DIAGNOSIS — R0902 Hypoxemia: Secondary | ICD-10-CM

## 2018-12-19 DIAGNOSIS — I130 Hypertensive heart and chronic kidney disease with heart failure and stage 1 through stage 4 chronic kidney disease, or unspecified chronic kidney disease: Principal | ICD-10-CM | POA: Diagnosis present

## 2018-12-19 DIAGNOSIS — D899 Disorder involving the immune mechanism, unspecified: Secondary | ICD-10-CM | POA: Diagnosis not present

## 2018-12-19 DIAGNOSIS — Z8674 Personal history of sudden cardiac arrest: Secondary | ICD-10-CM

## 2018-12-19 DIAGNOSIS — Z87891 Personal history of nicotine dependence: Secondary | ICD-10-CM

## 2018-12-19 DIAGNOSIS — K5900 Constipation, unspecified: Secondary | ICD-10-CM

## 2018-12-19 DIAGNOSIS — Z91048 Other nonmedicinal substance allergy status: Secondary | ICD-10-CM

## 2018-12-19 DIAGNOSIS — Z882 Allergy status to sulfonamides status: Secondary | ICD-10-CM

## 2018-12-19 DIAGNOSIS — Z794 Long term (current) use of insulin: Secondary | ICD-10-CM

## 2018-12-19 DIAGNOSIS — Z9981 Dependence on supplemental oxygen: Secondary | ICD-10-CM | POA: Diagnosis not present

## 2018-12-19 DIAGNOSIS — Z888 Allergy status to other drugs, medicaments and biological substances status: Secondary | ICD-10-CM

## 2018-12-19 DIAGNOSIS — Z7952 Long term (current) use of systemic steroids: Secondary | ICD-10-CM

## 2018-12-19 DIAGNOSIS — Z823 Family history of stroke: Secondary | ICD-10-CM

## 2018-12-19 DIAGNOSIS — Z881 Allergy status to other antibiotic agents status: Secondary | ICD-10-CM

## 2018-12-19 DIAGNOSIS — Z79899 Other long term (current) drug therapy: Secondary | ICD-10-CM

## 2018-12-19 LAB — PROTIME-INR
INR: 1.7 — ABNORMAL HIGH (ref 0.8–1.2)
Prothrombin Time: 19.7 seconds — ABNORMAL HIGH (ref 11.4–15.2)

## 2018-12-19 LAB — CBC WITH DIFFERENTIAL/PLATELET
Abs Immature Granulocytes: 0.02 10*3/uL (ref 0.00–0.07)
Basophils Absolute: 0 10*3/uL (ref 0.0–0.1)
Basophils Relative: 0 %
Eosinophils Absolute: 0.1 10*3/uL (ref 0.0–0.5)
Eosinophils Relative: 1 %
HCT: 43.5 % (ref 36.0–46.0)
Hemoglobin: 12.9 g/dL (ref 12.0–15.0)
Immature Granulocytes: 0 %
Lymphocytes Relative: 10 %
Lymphs Abs: 0.7 10*3/uL (ref 0.7–4.0)
MCH: 24.3 pg — ABNORMAL LOW (ref 26.0–34.0)
MCHC: 29.7 g/dL — ABNORMAL LOW (ref 30.0–36.0)
MCV: 82.1 fL (ref 80.0–100.0)
Monocytes Absolute: 0.6 10*3/uL (ref 0.1–1.0)
Monocytes Relative: 8 %
Neutro Abs: 6 10*3/uL (ref 1.7–7.7)
Neutrophils Relative %: 81 %
Platelets: 205 10*3/uL (ref 150–400)
RBC: 5.3 MIL/uL — ABNORMAL HIGH (ref 3.87–5.11)
RDW: 19.9 % — ABNORMAL HIGH (ref 11.5–15.5)
WBC: 7.4 10*3/uL (ref 4.0–10.5)
nRBC: 0 % (ref 0.0–0.2)

## 2018-12-19 LAB — BRAIN NATRIURETIC PEPTIDE: B Natriuretic Peptide: 1637 pg/mL — ABNORMAL HIGH (ref 0.0–100.0)

## 2018-12-19 LAB — BASIC METABOLIC PANEL
Anion gap: 10 (ref 5–15)
BUN: 31 mg/dL — ABNORMAL HIGH (ref 8–23)
CO2: 32 mmol/L (ref 22–32)
Calcium: 9.7 mg/dL (ref 8.9–10.3)
Chloride: 100 mmol/L (ref 98–111)
Creatinine, Ser: 1.89 mg/dL — ABNORMAL HIGH (ref 0.44–1.00)
GFR calc Af Amer: 30 mL/min — ABNORMAL LOW (ref >60)
GFR calc non Af Amer: 26 mL/min — ABNORMAL LOW (ref >60)
Glucose, Bld: 223 mg/dL — ABNORMAL HIGH (ref 70–99)
Potassium: 4.3 mmol/L (ref 3.5–5.1)
Sodium: 142 mmol/L (ref 135–145)

## 2018-12-19 LAB — TSH: TSH: 1.645 u[IU]/mL (ref 0.350–4.500)

## 2018-12-19 LAB — POCT INR: INR: 1.6 — AB (ref 2.0–3.0)

## 2018-12-19 LAB — HEMOGLOBIN A1C
Hgb A1c MFr Bld: 7.9 % — ABNORMAL HIGH (ref 4.8–5.6)
Mean Plasma Glucose: 180.03 mg/dL

## 2018-12-19 LAB — SARS CORONAVIRUS 2 BY RT PCR (HOSPITAL ORDER, PERFORMED IN ~~LOC~~ HOSPITAL LAB): SARS Coronavirus 2: NEGATIVE

## 2018-12-19 MED ORDER — ONDANSETRON HCL 4 MG/2ML IJ SOLN: 4.0000 mg | Freq: Four times a day (QID) | INTRAMUSCULAR | Status: DC | PRN

## 2018-12-19 MED ORDER — ROSUVASTATIN CALCIUM 20 MG PO TABS
20.0000 mg | ORAL_TABLET | Freq: Every day | ORAL | Status: DC
  Administered 2018-12-20 – 2018-12-26 (×8): 20 mg via ORAL
  Filled 2018-12-19 (×8): qty 1

## 2018-12-19 MED ORDER — WARFARIN SODIUM 7.5 MG PO TABS
7.5000 mg | ORAL_TABLET | Freq: Once | ORAL | Status: DC
  Filled 2018-12-19: qty 1

## 2018-12-19 MED ORDER — METOPROLOL TARTRATE 12.5 MG HALF TABLET
12.5000 mg | ORAL_TABLET | Freq: Two times a day (BID) | ORAL | Status: DC
  Administered 2018-12-20 – 2018-12-26 (×12): 12.5 mg via ORAL
  Filled 2018-12-19 (×13): qty 1

## 2018-12-19 MED ORDER — SODIUM CHLORIDE 0.9 % IV SOLN
250.0000 mL | INTRAVENOUS | Status: DC | PRN
  Administered 2018-12-25 (×2): 250 mL via INTRAVENOUS

## 2018-12-19 MED ORDER — METOLAZONE 5 MG PO TABS
5.0000 mg | ORAL_TABLET | Freq: Once | ORAL | Status: AC
  Administered 2018-12-20: 5 mg via ORAL
  Filled 2018-12-19: qty 1

## 2018-12-19 MED ORDER — FUROSEMIDE 10 MG/ML IJ SOLN
40.0000 mg | Freq: Once | INTRAMUSCULAR | Status: AC
  Administered 2018-12-19: 40 mg via INTRAVENOUS
  Filled 2018-12-19: qty 4

## 2018-12-19 MED ORDER — INSULIN ASPART 100 UNIT/ML ~~LOC~~ SOLN
0.0000 [IU] | Freq: Every day | SUBCUTANEOUS | Status: DC
  Administered 2018-12-20: 4 [IU] via SUBCUTANEOUS

## 2018-12-19 MED ORDER — SODIUM CHLORIDE 0.9% FLUSH
3.0000 mL | Freq: Two times a day (BID) | INTRAVENOUS | Status: DC
  Administered 2018-12-20 – 2018-12-26 (×13): 3 mL via INTRAVENOUS

## 2018-12-19 MED ORDER — LISINOPRIL 5 MG PO TABS
5.0000 mg | ORAL_TABLET | Freq: Every day | ORAL | Status: DC
  Filled 2018-12-19: qty 1

## 2018-12-19 MED ORDER — PREDNISONE 5 MG PO TABS
5.0000 mg | ORAL_TABLET | Freq: Every day | ORAL | Status: DC
  Administered 2018-12-20 – 2018-12-26 (×7): 5 mg via ORAL
  Filled 2018-12-19 (×8): qty 1

## 2018-12-19 MED ORDER — DAPSONE 25 MG PO TABS
25.0000 mg | ORAL_TABLET | Freq: Every day | ORAL | Status: DC
  Administered 2018-12-20 – 2018-12-26 (×7): 25 mg via ORAL
  Filled 2018-12-19 (×7): qty 1

## 2018-12-19 MED ORDER — WARFARIN - PHARMACIST DOSING INPATIENT
Freq: Every day | Status: DC
  Administered 2018-12-21 – 2018-12-24 (×4)

## 2018-12-19 MED ORDER — SODIUM CHLORIDE 0.9% FLUSH: 3.0000 mL | INTRAVENOUS | Status: DC | PRN

## 2018-12-19 MED ORDER — AMLODIPINE BESYLATE 2.5 MG PO TABS
5.0000 mg | ORAL_TABLET | Freq: Every day | ORAL | Status: DC
  Administered 2018-12-20: 5 mg via ORAL
  Filled 2018-12-19: qty 2

## 2018-12-19 MED ORDER — GABAPENTIN 100 MG PO CAPS
100.0000 mg | ORAL_CAPSULE | Freq: Two times a day (BID) | ORAL | Status: DC
  Administered 2018-12-20 – 2018-12-26 (×13): 100 mg via ORAL
  Filled 2018-12-19 (×14): qty 1

## 2018-12-19 MED ORDER — INSULIN ASPART 100 UNIT/ML ~~LOC~~ SOLN
0.0000 [IU] | Freq: Three times a day (TID) | SUBCUTANEOUS | Status: DC
  Administered 2018-12-20: 3 [IU] via SUBCUTANEOUS

## 2018-12-19 MED ORDER — FUROSEMIDE 10 MG/ML IJ SOLN
40.0000 mg | Freq: Three times a day (TID) | INTRAMUSCULAR | Status: DC
  Administered 2018-12-20 (×2): 40 mg via INTRAVENOUS
  Filled 2018-12-19 (×2): qty 4

## 2018-12-19 MED ORDER — ACETAMINOPHEN 325 MG PO TABS
650.0000 mg | ORAL_TABLET | ORAL | Status: DC | PRN
  Administered 2018-12-24 – 2018-12-25 (×2): 650 mg via ORAL
  Filled 2018-12-19 (×2): qty 2

## 2018-12-19 MED ORDER — INSULIN GLARGINE 100 UNIT/ML ~~LOC~~ SOLN
15.0000 [IU] | Freq: Every day | SUBCUTANEOUS | Status: DC
  Administered 2018-12-20 – 2018-12-25 (×7): 15 [IU] via SUBCUTANEOUS
  Filled 2018-12-19 (×8): qty 0.15

## 2018-12-19 MED ORDER — TACROLIMUS 1 MG PO CAPS
3.0000 mg | ORAL_CAPSULE | Freq: Two times a day (BID) | ORAL | Status: DC
  Administered 2018-12-20 – 2018-12-26 (×14): 3 mg via ORAL
  Filled 2018-12-19 (×15): qty 3

## 2018-12-19 MED ORDER — LEVOTHYROXINE SODIUM 50 MCG PO TABS
50.0000 ug | ORAL_TABLET | Freq: Every day | ORAL | Status: DC
  Administered 2018-12-20 – 2018-12-26 (×7): 50 ug via ORAL
  Filled 2018-12-19 (×8): qty 1

## 2018-12-19 NOTE — ED Provider Notes (Signed)
[facility] EMERGENCY DEPARTMENT Provider Note   CSN: [id number] Arrival date & time: 12/19/18  1557    History   Chief Complaint Chief Complaint  Patient presents with   Shortness of Breath   Leg Swelling    HPI [name] is a 73 y.o. female.     73yo female with history of CHF, renal transplant, aortic valve replacement, presents with complaint of swelling of the breasts, stomach, legs, SHOB on her baseline O2. Patient went to her nephrologist 1 week ago for same. Patient states she was taking a fluid pill every day, was told to decrease use to every other day and this is when her breast swelling started. Patient increased use to one daily without improvement. Increased use to BID with out much improvement. Patient has gained 20-30 lbs in the past month. Baseline orthopnea. Denies fevers, chills, chest pain. No other complaints or concerns.   [name] was evaluated in Emergency Department on 12/19/2018 for the symptoms described in the history of present illness. She was evaluated in the context of the global COVID-19 pandemic, which necessitated consideration that the patient might be at risk for infection with the SARS-CoV-2 virus that causes COVID-19. Institutional protocols and algorithms that pertain to the evaluation of patients at risk for COVID-19 are in a state of rapid change based on information released by regulatory bodies including the CDC and federal and state organizations. These policies and algorithms were followed during the patient's care in the ED.      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 Severe AS by echo 11/2012.   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 During ENT surgery 2005: developed marked asystole requiring CPR, felt due to vagal reaction (cath nonobst  dz).   Carotid artery disease ([place])    a. Carotid Dopplers performed in August 2013 showed 40-59% left stenosis and 0-39% right; f/u recommended in 2 years.    Cerebrovascular accident [name]) 2009   a. LMCA infarct felt embolic 5188, maintained on chronic coumadin.; denies residual on 04/05/2013   CHF (congestive heart failure) (HCC)    Cholelithiasis    Chronic cough onset 03/2010   Dr Melvyn Novas   Chronic Persistent Atrial Fibrillation 12/31/2008   Qualifier: Diagnosis of  By: [name]     Coronary artery disease 05/2002   a. Ant MI 2003 s/p PTCA/stent to RCA.    Diverticulosis of colon    Esophagitis, reflux    ESRD (end stage renal disease) ([place])    a. Mass on L kidney per pt s/p nephrectomy - pt states not cancer - [facility] notes indicate ESRD due to HTN/DM - was previously on HD. b. Kidney transplant 02/2011.   GERD (gastroesophageal reflux disease)    Gout    Helicobacter pylori (H. pylori) infection    hx of   Hemorrhoids    Hx of colonic polyps    adenomatous   Hyperlipidemia    Hypertension    Lung nodule seen on imaging study 04/07/2013   1.0 cm ground glass opacity RUL   Myocardial infarction [facility] 2003   Pericardial effusion    a. Small by echo 11/2011.   Pruritic dermatitis    treated with steriods/UV light   S/P aortic valve replacement with bioprosthetic  valve and maze procedure 04/12/2013   52mm [facility] Ease bovine pericardial tissue valve    S/P Maze operation for atrial fibrillation 04/12/2013   Complete bilateral atrial lesion set using bipolar radiofrequency and cryothermy ablation with clipping of LA appendage   Sleep apnea    Pt says testing was positive, intolerant of CPAP.   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   Diabetes mellitus due to underlying condition with hyperglycemia, without long-term current use of insulin (HCC)    Chronic respiratory failure with hypoxia (HCC) 12/27/2016   Acute on chronic diastolic CHF (congestive heart failure) ([place]) 12/27/2016   CKD (chronic kidney disease), stage III ([place]) 12/27/2016   Hypertensive urgency 12/27/2016   Pain of left breast 12/27/2016   Arm muscle atrophy 11/16/2016   Insomnia 02/25/2016   Renal cyst 11/21/2015   Renal mass, right 11/10/2015   Shoulder pain, right 08/13/2015   Earache 11/11/2014   Weight gain 08/12/2014   Multinodular goiter 05/01/2014   Encounter for therapeutic drug monitoring 08/14/2013   S/P AVR (aortic valve replacement) 05/14/2013   S/P aortic valve replacement with bioprosthetic valve and maze procedure 04/12/2013   S/P Maze operation for atrial fibrillation 04/12/2013   Nodule of right lung 04/07/2013   Lung nodule seen on imaging study 04/07/2013   Pain in limb 05/22/2012   Dermatitis 01/26/2012   Wrist pain 01/26/2012   Long term (current) use of anticoagulants 08/17/2011   Anemia in chronic renal disease 05/13/2011   Anemia secondary to renal failure 05/13/2011   Hypokalemia 04/26/2011   Hypomagnesemia 04/26/2011   Immunosuppression ([place]) 04/26/2011   Renal transplant recipient 03/16/2011   Low back pain 02/23/2011   Hypersalivation 11/24/2010   AORTIC STENOSIS 08/20/2010   DISTURBANCE OF SALIVARY SECRETION  07/28/2010   COAGULOPATHY 05/27/2010   NAUSEA 04/14/2010   SMOKER 10/07/2009   ALOPECIA 10/07/2009   CAROTID STENOSIS 03/04/2009   PRURITUS 03/04/2009   STREP INF CCE & UNS SITE GROUP D [ENTEROCOCCUS] 12/31/2008   Chronic Persistent Atrial Fibrillation 12/31/2008   ECCHYMOSES 10/29/2008   Cough 08/27/2008   NEOPLASM, MALIGNANT, KIDNEY 07/02/2008   APHASIA DUE TO CEREBROVASCULAR DISEASE 07/02/2008   Chronic fatigue 05/21/2008   HYPERLIPIDEMIA 09/27/2007   CONSTIPATION 09/27/2007   SLEEP APNEA 09/27/2007   CHOLELITHIASIS 06/09/2007   ESOPHAGITIS, REFLUX 22/29/7989   HELICOBACTER PYLORI INFECTION, HX OF 06/08/2007   DM type 2 causing renal disease ([place]) 05/23/2007   Gout 05/23/2007   Hypertension due to kidney transplant 05/23/2007   MYOCARDIAL INFARCTION, HX OF 05/23/2007   Coronary atherosclerosis 05/23/2007   Congestive heart failure ([place]) 05/23/2007   GERD 05/23/2007   COLONIC POLYPS, HX OF 05/23/2007   DIVERTICULOSIS, COLON 07/01/2005    Past Surgical History:  Procedure Laterality Date   AORTIC VALVE REPLACEMENT N/A 04/12/2013  Procedure: AORTIC VALVE REPLACEMENT (AVR);  Surgeon: Rexene Alberts, MD;  Location: [facility];  Service: Open Heart Surgery;  Laterality: N/A;   ARTERIOVENOUS GRAFT PLACEMENT Left    ARTERIOVENOUS GRAFT PLACEMENT Left    "I've had 2 on my left; had one removed" (04/05/2013)    ARTERY EXPLORATION Right 04/11/2013   Procedure: ARTERY EXPLORATION;  Surgeon: Rexene Alberts, MD;  Location: [facility];  Service: Open Heart Surgery;  Laterality: Right;  Right carotid artery exploration   AV FISTULA PLACEMENT Right    AV FISTULA REPAIR Right    "took it out" ((/18/2014)   CARDIOVERSION  05/29/2012   Procedure: CARDIOVERSION;  Surgeon: Lelon Perla, MD;  Location[name] ENDOSCOPY;  Service: Cardiovascular;  Laterality: N/A;   CHOLECYSTECTOMY  2009   with hernia removal   CORONARY ANGIOPLASTY WITH STENT PLACEMENT Right     coronary artery   INSERTION OF DIALYSIS CATHETER Bilateral    "over the years; took them both out" (04/05/2013)   INTRAOPERATIVE TRANSESOPHAGEAL ECHOCARDIOGRAM N/A 04/11/2013   Procedure: INTRAOPERATIVE TRANSESOPHAGEAL ECHOCARDIOGRAM;  Surgeon: Rexene Alberts, MD;  Location: [facility];  Service: Open Heart Surgery;  Laterality: N/A;   INTRAOPERATIVE TRANSESOPHAGEAL ECHOCARDIOGRAM N/A 04/12/2013   Procedure: INTRAOPERATIVE TRANSESOPHAGEAL ECHOCARDIOGRAM;  Surgeon: Rexene Alberts, MD;  Location: [facility];  Service: Open Heart Surgery;  Laterality: N/A;   KIDNEY TRANSPLANT  03/16/11   LEFT AND RIGHT HEART CATHETERIZATION WITH CORONARY ANGIOGRAM N/A 04/06/2013   Procedure: LEFT AND RIGHT HEART CATHETERIZATION WITH CORONARY ANGIOGRAM;  Surgeon: Blane Ohara, MD;  Location: [facility] CATH LAB;  Service: Cardiovascular;  Laterality: N/A;   MAZE N/A 04/12/2013   Procedure: MAZE;  Surgeon: Rexene Alberts, MD;  Location: [facility];  Service: Open Heart Surgery;  Laterality: N/A;   NASAL RECONSTRUCTION WITH SEPTAL REPAIR     "took it out" (04/05/2013)   NEPHRECTOMY Left 2010   no CA on bx   TONSILLECTOMY     TOTAL ABDOMINAL HYSTERECTOMY     TUBAL LIGATION       OB History   No obstetric history on file.      Home Medications    Prior to Admission medications   Medication Sig Start Date End Date Taking? Authorizing Provider  amLODipine (NORVASC) 10 MG tablet Take 0.5 tablets (5 mg total) by mouth daily. Patient taking differently: Take 5 mg by mouth daily. Twice a week 08/28/18   Plotnikov, [name], MD  amoxicillin (AMOXIL) 500 MG capsule TAKE 4 CAPSULES BY MOUTH 1 HOUR PRIOR TO DENTAL APPOINTMENT 10/14/17   Plotnikov, [name], MD  dapsone 25 MG tablet Take 25 mg by mouth daily.     [provider]  gabapentin (NEURONTIN) 100 MG capsule Take 100 mg by mouth 2 (two) times daily.     [provider]  Insulin Glargine, 1 Unit Dial, (TOUJEO SOLOSTAR) 300 UNIT/ML SOPN Inject 24 Units  into the skin every morning. Titrate up by 1 unit a day for goal sugars of 100-130 up to 50 units a day 08/28/18   Plotnikov, [name], MD  Insulin Pen Needle (B-D UF III MINI PEN NEEDLES) 31G X 5 MM MISC USE TO ADMINISTER INSULIN FOUR TIMES A DAY DX E11.9 11/11/17   Plotnikov, [name], MD  levothyroxine (SYNTHROID) 25 MCG tablet TAKE 2 TABLETS (50 MCG TOTAL) BY MOUTH DAILY. 12/18/18   Plotnikov, [name], MD  lisinopril (PRINIVIL,ZESTRIL) 5 MG tablet Take 5 mg by mouth at bedtime.  [provider]  metoprolol tartrate (LOPRESSOR) 25 MG tablet TAKE 0.5 TABLETS (12.5 MG TOTAL) BY MOUTH 2 (TWO) TIMES DAILY. TAKE 1 TABLET BY MOUTH TWICE A DAY 08/04/18   Lelon Perla, MD  Multiple Vitamins-Minerals (CENTRUM SILVER 50+WOMEN) TABS Take 1 tablet by mouth daily with breakfast.    [provider]  NON FORMULARY 2 L oxygen at home as needed    [provider]  [facility] DELICA LANCETS 24P MISC Use to check blood sugars three times a day DX E11.9 10/12/17   Plotnikov, [name], MD  ONETOUCH VERIO test strip USE TO TEST 3 TIMES DAILY. DX E11.9 10/20/18   Hoyt Koch, MD  predniSONE (DELTASONE) 5 MG tablet Take 5 mg by mouth daily. Pt reports 3 times a week 03/21/18   [provider]  rosuvastatin (CRESTOR) 20 MG tablet Take 1 tablet (20 mg total) by mouth daily. 11/14/17   Lelon Perla, MD  tacrolimus (PROGRAF) 1 MG capsule Take 3 mg by mouth 2 (two) times daily. Take 3 tablets by mouth twice daily     [provider]  torsemide (DEMADEX) 100 MG tablet Take 100 mg by mouth 2 (two) times a day.    [provider]  warfarin (COUMADIN) 5 MG tablet Take 1/2 to 1 tablet by mouth daily as directed 05/29/18   Lelon Perla, MD    Family History Family History  Problem Relation Age of Onset   Stroke Father    Hypertension Mother    Diabetes Other    Diabetes Maternal Grandmother    Diabetes Son    Breast cancer Neg Hx     Social  History Social History   Tobacco Use   Smoking status: Former Smoker    Packs/day: 1.00    Years: 30.00    Pack years: 30.00    Types: Cigarettes    Last attempt to quit: 07/19/2001    Years since quitting: 17.4   Smokeless tobacco: Never Used  Substance Use Topics   Alcohol use: No   Drug use: No     Allergies   Ibuprofen; Sulfamethoxazole-trimethoprim; Sulfonamide derivatives; Tape; Tramadol; Doxycycline; Hydrocil [psyllium]; Bactrim; and Red dye   Review of Systems Review of Systems  Constitutional: Negative for chills, diaphoresis and fever.  Respiratory: Positive for shortness of breath. Negative for cough and chest tightness.   Cardiovascular: Positive for leg swelling. Negative for chest pain.  Gastrointestinal: Negative for abdominal pain, constipation, diarrhea, nausea and vomiting.  Skin: Negative for color change and rash.  Allergic/Immunologic: Positive for immunocompromised state.  Neurological: Negative for weakness.  Psychiatric/Behavioral: Negative for confusion.  All other systems reviewed and are negative.    Physical Exam Updated Vital Signs BP (!) 150/63    Pulse 78    Temp 98.9 F (37.2 C) (Oral)    Resp 16    SpO2 95%   Physical Exam Vitals signs and nursing note reviewed.  Constitutional:      General: She is not in acute distress.    Appearance: She is well-developed. She is not diaphoretic.  HENT:     Head: Normocephalic and atraumatic.  Cardiovascular:     Rate and Rhythm: Normal rate and regular rhythm.     Pulses: Normal pulses.  Pulmonary:     Effort: Pulmonary effort is normal. No tachypnea or respiratory distress.     Breath sounds: Examination of the right-lower field reveals rales. Examination of the left-lower field reveals rales. Rales present.  Chest:     Comments: Edema to breasts without erythema Abdominal:     Tenderness: There is no abdominal tenderness.  Musculoskeletal:     Comments: Mild pitting edema to bilateral  extremities.   Skin:    General: Skin is warm and dry.  Neurological:     Mental Status: She is alert and oriented to person, place, and time.  Psychiatric:        Behavior: Behavior normal.      ED Treatments / Results  Labs (all labs ordered are listed, but only abnormal results are displayed) Labs Reviewed  BASIC METABOLIC PANEL - Abnormal; Notable for the following components:      Result Value   Glucose, Bld 223 (*)    BUN 31 (*)    Creatinine, Ser 1.89 (*)    GFR calc non Af Amer 26 (*)    GFR calc Af Amer 30 (*)    All other components within normal limits  CBC WITH DIFFERENTIAL/PLATELET - Abnormal; Notable for the following components:   RBC 5.30 (*)    MCH 24.3 (*)    MCHC 29.7 (*)    RDW 19.9 (*)    All other components within normal limits  BRAIN NATRIURETIC PEPTIDE - Abnormal; Notable for the following components:   B Natriuretic Peptide 1,637.0 (*)    All other components within normal limits  PROTIME-INR - Abnormal; Notable for the following components:   Prothrombin Time 19.7 (*)    INR 1.7 (*)    All other components within normal limits  SARS CORONAVIRUS 2 (HOSPITAL ORDER, [facility])    EKG EKG Interpretation  Date/Time:  Tuesday December 19 2018 16:09:14 EDT Ventricular Rate:  65 PR Interval:  172 QRS Duration: 86 QT Interval:  432 QTC Calculation: 449 R Axis:   58 Text Interpretation:  Sinus rhythm with Premature atrial complexes Possible Anterior infarct , age undetermined T wave abnormality, consider lateral ischemia Abnormal ECG Confirmed by Dene Gentry [phone]) on 12/19/2018 4:13:52 PM Also confirmed by Dene Gentry [phone]), editor Philomena Doheny [phone])  on 12/19/2018 4:40:30 PM   Radiology Dg Chest Port 1 View  Result Date: 12/19/2018 CLINICAL DATA:  Diffuse soft tissue swelling and shortness of breath for several days. EXAM: PORTABLE CHEST 1 VIEW COMPARISON:  None. FINDINGS: The heart is enlarged but stable. Stable  surgical changes from aortic valve replacement surgery. A left atrial closure device is noted. There is moderate central vascular congestion and mild interstitial edema suggesting CHF. Small effusions are also suspected. No definite infiltrates. The bony thorax is grossly intact. IMPRESSION: CHF. Electronically Signed   By: Marijo Sanes M.D.   On: 12/19/2018 16:56    Procedures Procedures (including critical care time)  Medications Ordered in ED Medications  furosemide (LASIX) injection 40 mg (40 mg Intravenous Given 12/19/18 1751)     Initial Impression / Assessment and Plan / ED Course  I have reviewed the triage vital signs and the nursing notes.  Pertinent labs & imaging results that were available during my care of the patient were reviewed by me and considered in my medical decision making (see chart for details).  Clinical Course as of Dec 18 1804  Tue Dec 18, 4344  [id number] 73 year old female with history of CHF presents with complaint of shortness of breath and swelling to her breasts, left arm, legs and abdomen.  Patient states that she's gained 30 pounds in the past month, taking her torsemide without improvement  in her symptoms. On exam patient has mild pitting edema to bilateral lower extremities, left arm,.  Slight rales in the bases.  Patient was initially hypoxic on arrival with O2 86%- not documented if this was on her baseline O2, improved with O2 via [place].  Patient was given 40mg  Lasix. CXR shows CHF, BNP 1,637, increased from previous    [LM]    Clinical Course User Index [LM] Tacy Learn, PA-C      Final Clinical Impressions(s) / ED Diagnoses   Final diagnoses:  Acute on chronic congestive heart failure, unspecified heart failure type [facility])    ED Discharge Orders    None       Tacy Learn, PA-C 12/19/18 1806    Valarie Merino, MD 12/20/18 [phone]

## 2018-12-19 NOTE — H&P (Signed)
History and Physical    [name] XTK:[id number] DOB: 1945-10-24 DOA: 12/19/2018  PCP: [name], MD  Patient coming from: Home  I have personally briefly reviewed patient's old medical records in [place]  Chief Complaint: Shortness of breath, worsening peripheral edema  HPI: [name] is a 73 y.o. female with medical history significant of CAD status post PCI to RCA, atrial fibrillation on Coumadin, severe left ear status post AVR, CKD stage III with history of renal transplant 2012, IDDM, and chronic diastolic congestive heart failure who presents from her cardiologist office with complaints of progressive shortness of breath and peripheral edema.  Patient reports back in January, her PCP instructed her to decrease her diuretic from once daily to every other day.  Since, she is noticed progressive worsening of her lower extremity edema.  She now reports 3-4 pillow orthopnea.  She noticed her breasts was hurting last month and she underwent repeat mammography with report notable for increased fluid edema.  She reports her dry weight between 155-158 pounds, now reports she is up to 180 pounds.  Over the past few weeks she is increased her torsemide use to 100 mg p.o. twice daily without much effect.  Patient also reports she had a heartburn sensation 2 nights ago that was relieved with seltzer water.   Patient denies any visual changes, no headaches, no nausea/vomiting/diarrhea, no current chest pain, no palpitations, no abdominal pain, no weakness, no paresthesias, no cough/congestion.  She further denies any sick contacts, no other changes in her home medication regimen, or changes in her diet.  ED Course: Temperature 98.9, HR 61, RR 21, BP 132/59, SPO2 86% on room air, WBC 7.4, hemoglobin 12.9, platelets 205, sodium 142, creatinine 4.3, chloride 100, CO2 32, BUN 31, creatinine 1.89, glucose 223, BNP 1637, INR 1.7, COVID-19 test pending, chest x-ray notable for cardiomegaly  with moderate vascular congestion and interstitial edema consistent with CHF exacerbation.  EKG notable for normal sinus rhythm, rate 65 with T wave inversions in 1 and aVL which are similar in comparison to EKG dated 09/28/2018.  Patient referred for admission by EDP secondary to acute hypoxic respiratory failure likely secondary to significant volume overload from underlying CHF exacerbation.  Review of Systems: As per HPI otherwise 10 point review of systems negative.    Past Medical History:  Diagnosis Date  . Adenocarcinoma of lung, stage 1, right ([place]) 11/25/2017  . Anemia, iron deficiency    of chronic disease  . Aortic stenosis    a. Severe AS by echo 11/2012.  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 Asystole ([place])    a. During ENT surgery 2005: developed marked asystole requiring CPR, felt due to vagal reaction (cath nonobst dz).  . Carotid artery disease ([place])    a. Carotid Dopplers performed in August 2013 showed 40-59% left stenosis and 0-39% right; f/u recommended in 2 years.   . Cerebrovascular accident [facility]) 2009   a. LMCA infarct felt embolic 9924, maintained on chronic coumadin.; denies residual on 04/05/2013  . CHF (congestive heart failure) ([place])   . Cholelithiasis   . Chronic cough onset 03/2010   Dr Melvyn Novas  . Chronic Persistent Atrial Fibrillation 12/31/2008   Qualifier: Diagnosis of  By: [name]    . Coronary artery disease 05/2002   a. Ant MI 2003 s/p PTCA/stent to RCA.   . Diverticulosis of colon   . Esophagitis, reflux   . ESRD (end stage renal disease) (  [place])    a. Mass on L kidney per pt s/p nephrectomy - pt states not cancer - [facility] notes indicate ESRD due to HTN/DM - was previously on HD. b. Kidney transplant 02/2011.  Marland Kitchen GERD (gastroesophageal reflux disease)   . Gout   . Helicobacter pylori (H. pylori) infection    hx of  . Hemorrhoids   . Hx of colonic polyps    adenomatous  . Hyperlipidemia   . Hypertension   . Lung nodule seen  on imaging study 04/07/2013   1.0 cm ground glass opacity RUL  . Myocardial infarction ([place]) 2003  . Pericardial effusion    a. Small by echo 11/2011.  Marland Kitchen Pruritic dermatitis    treated with steriods/UV light  . S/P aortic valve replacement with bioprosthetic valve and maze procedure 04/12/2013   61mm [facility] Ease bovine pericardial tissue valve   . S/P Maze operation for atrial fibrillation 04/12/2013   Complete bilateral atrial lesion set using bipolar radiofrequency and cryothermy ablation with clipping of LA appendage  . Sleep apnea    Pt says testing was positive, intolerant of CPAP.  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  . Type II diabetes mellitus ([place])   . Unspecified hearing loss     Past Surgical History:  Procedure Laterality Date  . AORTIC VALVE REPLACEMENT N/A 04/12/2013   Procedure: AORTIC VALVE REPLACEMENT (AVR);  Surgeon: Rexene Alberts, MD;  Location: [place];  Service: Open Heart Surgery;  Laterality: N/A;  . ARTERIOVENOUS GRAFT PLACEMENT Left   . ARTERIOVENOUS GRAFT PLACEMENT Left    "I've had 2 on my left; had one removed" (04/05/2013)   . ARTERY EXPLORATION Right 04/11/2013   Procedure: ARTERY EXPLORATION;  Surgeon: Rexene Alberts, MD;  Location: [place];  Service: Open Heart Surgery;  Laterality: Right;  Right carotid artery exploration  . AV FISTULA PLACEMENT Right   . AV FISTULA REPAIR Right    "took it out" ((/18/2014)  . CARDIOVERSION  05/29/2012   Procedure: CARDIOVERSION;  Surgeon: Lelon Perla, MD;  Location: [facility] ENDOSCOPY;  Service: Cardiovascular;  Laterality: N/A;  . CHOLECYSTECTOMY  2009   with hernia removal  . CORONARY ANGIOPLASTY WITH STENT PLACEMENT Right    coronary artery  . INSERTION OF DIALYSIS CATHETER Bilateral    "over the years; took them both out" (04/05/2013)  .  INTRAOPERATIVE TRANSESOPHAGEAL ECHOCARDIOGRAM N/A 04/11/2013   Procedure: INTRAOPERATIVE TRANSESOPHAGEAL ECHOCARDIOGRAM;  Surgeon: Rexene Alberts, MD;  Location: [place];  Service: Open Heart Surgery;  Laterality: N/A;  . INTRAOPERATIVE TRANSESOPHAGEAL ECHOCARDIOGRAM N/A 04/12/2013   Procedure: INTRAOPERATIVE TRANSESOPHAGEAL ECHOCARDIOGRAM;  Surgeon: Rexene Alberts, MD;  Location: [place];  Service: Open Heart Surgery;  Laterality: N/A;  . KIDNEY TRANSPLANT  03/16/11  . LEFT AND RIGHT HEART CATHETERIZATION WITH CORONARY ANGIOGRAM N/A 04/06/2013   Procedure: LEFT AND RIGHT HEART CATHETERIZATION WITH CORONARY ANGIOGRAM;  Surgeon: Blane Ohara, MD;  Location: [facility] CATH LAB;  Service: Cardiovascular;  Laterality: N/A;  . MAZE N/A 04/12/2013   Procedure: MAZE;  Surgeon: Rexene Alberts, MD;  Location: [place];  Service: Open Heart Surgery;  Laterality: N/A;  . NASAL RECONSTRUCTION WITH SEPTAL REPAIR     "took it out" (04/05/2013)  . NEPHRECTOMY Left 2010   no CA on bx  . TONSILLECTOMY    . TOTAL ABDOMINAL HYSTERECTOMY    .  TUBAL LIGATION       reports that she quit smoking about 17 years ago. Her smoking use included cigarettes. She has a 30.00 pack-year smoking history. She has never used smokeless tobacco. She reports that she does not drink alcohol or use drugs.  Allergies  Allergen Reactions  . Ibuprofen Nausea And Vomiting  . Sulfamethoxazole-Trimethoprim Itching, Swelling and Rash    Swelling of the face  . Sulfonamide Derivatives Itching, Swelling and Rash    Swelling of the face  . Tape Rash    Paper tape is ok  . Tramadol Nausea And Vomiting  . Doxycycline     nausea  . Hydrocil [Psyllium] Nausea And Vomiting  . Bactrim Itching, Swelling and Rash  . Red Dye Itching and Rash    Family History  Problem Relation Age of Onset  . Stroke Father   . Hypertension Mother   . Diabetes Other   . Diabetes Maternal Grandmother   . Diabetes Son   . Breast cancer Neg Hx      Prior to  Admission medications   Medication Sig Start Date End Date Taking? Authorizing Provider  amLODipine (NORVASC) 10 MG tablet Take 0.5 tablets (5 mg total) by mouth daily. Patient taking differently: Take 5 mg by mouth daily. Twice a week 08/28/18   Plotnikov, [name], MD  amoxicillin (AMOXIL) 500 MG capsule TAKE 4 CAPSULES BY MOUTH 1 HOUR PRIOR TO DENTAL APPOINTMENT 10/14/17   Plotnikov, [name], MD  dapsone 25 MG tablet Take 25 mg by mouth daily.     [provider]  gabapentin (NEURONTIN) 100 MG capsule Take 100 mg by mouth 2 (two) times daily.     [provider]  Insulin Glargine, 1 Unit Dial, (TOUJEO SOLOSTAR) 300 UNIT/ML SOPN Inject 24 Units into the skin every morning. Titrate up by 1 unit a day for goal sugars of 100-130 up to 50 units a day 08/28/18   Plotnikov, [name], MD  Insulin Pen Needle (B-D UF III MINI PEN NEEDLES) 31G X 5 MM MISC USE TO ADMINISTER INSULIN FOUR TIMES A DAY DX E11.9 11/11/17   Plotnikov, [name], MD  levothyroxine (SYNTHROID) 25 MCG tablet TAKE 2 TABLETS (50 MCG TOTAL) BY MOUTH DAILY. 12/18/18   Plotnikov, [name], MD  lisinopril (PRINIVIL,ZESTRIL) 5 MG tablet Take 5 mg by mouth at bedtime.     [provider]  metoprolol tartrate (LOPRESSOR) 25 MG tablet TAKE 0.5 TABLETS (12.5 MG TOTAL) BY MOUTH 2 (TWO) TIMES DAILY. TAKE 1 TABLET BY MOUTH TWICE A DAY 08/04/18   Lelon Perla, MD  Multiple Vitamins-Minerals (CENTRUM SILVER 50+WOMEN) TABS Take 1 tablet by mouth daily with breakfast.    [provider]  NON FORMULARY 2 L oxygen at home as needed    [provider]  [facility] DELICA LANCETS 18E MISC Use to check blood sugars three times a day DX E11.9 10/12/17   Plotnikov, [name], MD  ONETOUCH VERIO test strip USE TO TEST 3 TIMES DAILY. DX E11.9 10/20/18   Hoyt Koch, MD  predniSONE (DELTASONE) 5 MG tablet Take 5 mg by mouth daily. Pt reports 3 times a week 03/21/18   [provider]  rosuvastatin  (CRESTOR) 20 MG tablet Take 1 tablet (20 mg total) by mouth daily. 11/14/17   Lelon Perla, MD  tacrolimus (PROGRAF) 1 MG capsule Take 3 mg by mouth 2 (two) times daily. Take 3 tablets by mouth twice daily  [provider]  torsemide (DEMADEX) 100 MG tablet Take 100 mg by mouth 2 (two) times a day.    [provider]  warfarin (COUMADIN) 5 MG tablet Take 1/2 to 1 tablet by mouth daily as directed 05/29/18   Lelon Perla, MD    Physical Exam: Vitals:   12/19/18 1630 12/19/18 1700 12/19/18 1745 12/19/18 1800  BP: (!) 142/66 (!) 132/59 (!) 150/63   Pulse: (!) 58 61 78   Resp: 16 (!) 21 16   Temp:      TempSrc:      SpO2: 97% 97% 95% 97%    Constitutional: NAD, calm, comfortable Vitals:   12/19/18 1630 12/19/18 1700 12/19/18 1745 12/19/18 1800  BP: (!) 142/66 (!) 132/59 (!) 150/63   Pulse: (!) 58 61 78   Resp: 16 (!) 21 16   Temp:      TempSrc:      SpO2: 97% 97% 95% 97%   Eyes: PERRL, lids and conjunctivae normal ENMT: Mucous membranes are moist. Posterior pharynx clear of any exudate or lesions.Normal dentition.  Neck: normal, supple, no masses, no thyromegaly Respiratory: Diffuse crackles bilaterally, slightly decreased breath sounds bilateral bases, tachypneic with slightly increased work of breathing, no accessory muscle use, no wheezing, oxygenating 97% on 3 L nasal cannula Cardiovascular: Regular rate and rhythm, aortic murmur.  2+ pitting edema bilateral lower extremities, asymmetrical left upper extremity edema, noted left upper extremity AV fistula, 2+ pedal pulses.   Abdomen: no tenderness, no masses palpated. No hepatosplenomegaly. Bowel sounds positive.  Musculoskeletal: no clubbing / cyanosis. No joint deformity upper and lower extremities. Good ROM, no contractures. Normal muscle tone.  Noted asymmetric left upper extremity edema, 2+ pitting edema bilateral lower extremities Skin: no rashes, lesions, ulcers. No induration Neurologic: CN 2-12  grossly intact. Sensation intact, DTR normal. Strength 5/5 in all 4.  Psychiatric: Normal judgment and insight. Alert and oriented x 3. Normal mood.     Labs on Admission: I have personally reviewed following labs and imaging studies  CBC: Recent Labs  Lab 12/19/18 1621  WBC 7.4  NEUTROABS 6.0  HGB 12.9  HCT 43.5  MCV 82.1  PLT 782   Basic Metabolic Panel: Recent Labs  Lab 12/19/18 1621  NA 142  K 4.3  CL 100  CO2 32  GLUCOSE 223*  BUN 31*  CREATININE 1.89*  CALCIUM 9.7   GFR: Estimated Creatinine Clearance: 25.9 mL/min (A) (by C-G formula based on SCr of 1.89 mg/dL (H)). Liver Function Tests: No results for input(s): AST, ALT, ALKPHOS, BILITOT, PROT, ALBUMIN in the last 168 hours. No results for input(s): LIPASE, AMYLASE in the last 168 hours. No results for input(s): AMMONIA in the last 168 hours. Coagulation Profile: Recent Labs  Lab 12/19/18 1443 12/19/18 1621  INR 1.6* 1.7*   Cardiac Enzymes: No results for input(s): CKTOTAL, CKMB, CKMBINDEX, TROPONINI in the last 168 hours. BNP (last 3 results) No results for input(s): PROBNP in the last 8760 hours. HbA1C: No results for input(s): HGBA1C in the last 72 hours. CBG: No results for input(s): GLUCAP in the last 168 hours. Lipid Profile: No results for input(s): CHOL, HDL, LDLCALC, TRIG, CHOLHDL, LDLDIRECT in the last 72 hours. Thyroid Function Tests: No results for input(s): TSH, T4TOTAL, FREET4, T3FREE, THYROIDAB in the last 72 hours. Anemia Panel: No results for input(s): VITAMINB12, FOLATE, FERRITIN, TIBC, IRON, RETICCTPCT in the last 72 hours. Urine analysis:    Component Value Date/Time   COLORURINE YELLOW 02/06/2018 0944  APPEARANCEUR HAZY (A) 02/06/2018 0944   LABSPEC 1.008 02/06/2018 0944   PHURINE 7.0 02/06/2018 0944   GLUCOSEU NEGATIVE 02/06/2018 0944   GLUCOSEU 100 (A) 07/02/2016 1440   HGBUR LARGE (A) 02/06/2018 0944   BILIRUBINUR NEGATIVE 02/06/2018 0944   KETONESUR NEGATIVE  02/06/2018 0944   PROTEINUR 100 (A) 02/06/2018 0944   UROBILINOGEN 0.2 07/02/2016 1440   NITRITE NEGATIVE 02/06/2018 0944   LEUKOCYTESUR MODERATE (A) 02/06/2018 0944    Radiological Exams on Admission: Dg Chest Port 1 View  Result Date: 12/19/2018 CLINICAL DATA:  Diffuse soft tissue swelling and shortness of breath for several days. EXAM: PORTABLE CHEST 1 VIEW COMPARISON:  None. FINDINGS: The heart is enlarged but stable. Stable surgical changes from aortic valve replacement surgery. A left atrial closure device is noted. There is moderate central vascular congestion and mild interstitial edema suggesting CHF. Small effusions are also suspected. No definite infiltrates. The bony thorax is grossly intact. IMPRESSION: CHF. Electronically Signed   By: Marijo Sanes M.D.   On: 12/19/2018 16:56    EKG: Independently reviewed.  NSR with HR 65, T wave inversions notable in lead I and aVL which are similar in appearance to EKG on 09/28/2018.   Assessment/Plan Principal Problem:   Acute on chronic diastolic CHF (congestive heart failure) (HCC) Active Problems:   Chronic Persistent Atrial Fibrillation   Renal transplant recipient   S/P aortic valve replacement with bioprosthetic valve and maze procedure   CKD (chronic kidney disease), stage III (HCC)   Diabetes mellitus due to underlying condition with hyperglycemia, without long-term current use of insulin (HCC)   Immunosuppression (HCC)   Acute respiratory failure with hypoxia (HCC)  Acute hypoxic respiratory failure Acute on chronic diastolic congestive heart failure Patient presenting with progressive dyspnea and peripheral edema since decreased diuretic dose by her PCP back in January.  Now requiring 3-4 pillow orthopnea home with significant lower extremity edema.  Reported 25 pound weight gain from her normal dry weight of 155-158 pounds. Has failed outpatient treatment with increased oral diuretics, she has been recently taking torsemide 100  mg p.o. twice daily without any desired effect.  Patient with elevated BNP, vascular congestion findings on chest x-ray suggestive of volume overload and significant peripheral edema. --Admit inpatient --Monitor on telemetry --Continue IV diuresis with Lasix 40 mg IV 3 times daily --We will give dose of metolazone 5 mg p.o. x1 --Update echocardiogram --Continue to monitor daily weights and strict I's and O's --Closely monitor BMP given history of renal transplant  Left upper extremity swelling [name] asymmetric swelling to her left upper extremity.  She has previous AV fistula in place.  She states this edema to this extremity is worse than normal. --Check right upper extremity venous duplex ultrasound  Paroxysmal atrial fibrillation History of Maze procedure in 2014.  EKG notable for normal sinus rhythm currently.  On Coumadin 5 mg daily. --INR on admission 1.7 --Continue rate control with metoprolol tartrate 12.5 mg p.o. twice daily --Pharmacy consult for Coumadin dosing and monitoring while inpatient  CAD status post PCI Previous PCI to RCA in 2014. --Continue statin, beta-blocker, very  CKD stage III with history of renal transplant, immunosuppressed Patient is followed by Dr. Joelyn Oms nephrology in [place] and Dr. Roxy Horseman at [facility].  Creatinine on admission 1.89 with a baseline over the past 1 year between 1.20-2.25.  Appears stable. --Continue immunosuppression with prednisone, Prograf, tacrolimus --Follow creatinine closely with IV diuresis  Hypothyroidism --Check TSH --Continue Synthroid 50 mcg p.o. daily  Essential  Hypertension Blood pressure well controlled, 132/59. --Continue lisinopril, metoprolol, amlodipine  Aortic stenosis status post aortic valve replacement 2014 --repeat transthoracic echocardiogram as above  Insulin-dependent diabetes mellitus Last hemoglobin A1c 8.20 August 2018.  Patient reports takes Toujeo 50 units only if blood sugar is greater than 130.   Glucose on admission elevated to 223. --Lantus 15 units qHS --Sensitive NovoLog insulin sliding scale for further coverage  DVT prophylaxis: Coumadin Code Status: Full code Family Communication: Updated patient's spouse, [name] over telephone this evening Disposition Plan: Anticipate discharge home when medically ready Consults called: Cardiology, left message for cardiology master Admission status: Inpatient  Severity of Illness: The appropriate patient status for this patient is INPATIENT. Inpatient status is judged to be reasonable and necessary in order to provide the required intensity of service to ensure the patient's safety. The patient's presenting symptoms, physical exam findings, and initial radiographic and laboratory data in the context of their chronic comorbidities is felt to place them at high risk for further clinical deterioration. Furthermore, it is not anticipated that the patient will be medically stable for discharge from the hospital within 2 midnights of admission. The following factors support the patient status of inpatient.   " The patient's presenting symptoms include tachypnea, shortness of breath, orthopnea " The worrisome physical exam findings include significant peripheral edema, left upper extremity asymmetric edema, tachypnea, hypoxia " The initial radiographic and laboratory data are worrisome because of elevated BNP, chest x-ray with moderate vascular congestion and interstitial edema, elevated glucose of 223 " The chronic co-morbidities include history of renal transplant, immunosuppressed, history of aortic valve replacement with severe left ear, A. fib on anticoagulation with Coumadin, insulin-dependent diabetes mellitus, chronic diastolic congestive heart failure.   * I certify that at the point of admission it is my clinical judgment that the patient will require inpatient hospital care spanning beyond 2 midnights from the point of admission due to high  intensity of service, high risk for further deterioration and high frequency of surveillance required.*   [name] J [place] DO [facility] Pager [phone]  If 7PM-7AM, please contact night-coverage [email or web address] Password [facility]  12/19/2018, 6:34 PM

## 2018-12-19 NOTE — Patient Instructions (Signed)
GO DIRECTLY TO THE ER-AT [place]

## 2018-12-19 NOTE — Progress Notes (Signed)
ANTICOAGULATION CONSULT NOTE - Initial Consult  Pharmacy Consult for warfarin Indication: atrial fibrillation and hx of bioprosthetic aortic valve replacement  Allergies  Allergen Reactions  . Ibuprofen Nausea And Vomiting  . Sulfamethoxazole-Trimethoprim Itching, Swelling and Rash    Swelling of the face  . Sulfonamide Derivatives Itching, Swelling and Rash    Swelling of the face  . Tape Rash    Paper tape is ok  . Tramadol Nausea And Vomiting  . Doxycycline     nausea  . Hydrocil [Psyllium] Nausea And Vomiting  . Bactrim Itching, Swelling and Rash  . Red Dye Itching and Rash    Patient Measurements: Height: 5\' 1"  (154.9 cm) Weight: 184 lb 8 oz (83.7 kg)(scale a) IBW/kg (Calculated) : 47.8  Vital Signs: Temp: 98.4 F (36.9 C) (06/02 2014) Temp Source: Oral (06/02 2014) BP: 154/70 (06/02 2014) Pulse Rate: 55 (06/02 2014)  Labs: Recent Labs    12/19/18 1443 12/19/18 1621  HGB  --  12.9  HCT  --  43.5  PLT  --  205  LABPROT  --  19.7*  INR 1.6* 1.7*  CREATININE  --  1.89*    Estimated Creatinine Clearance: 25.6 mL/min (A) (by C-G formula based on SCr of 1.89 mg/dL (H)).   Medical History: Past Medical History:  Diagnosis Date  . Adenocarcinoma of lung, stage 1, right ([place]) 11/25/2017  . Anemia, iron deficiency    of chronic disease  . Aortic stenosis    a. Severe AS by echo 11/2012.  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 Asystole ([place])    a. During ENT surgery 2005: developed marked asystole requiring CPR, felt due to vagal reaction (cath nonobst dz).  . Carotid artery disease ([place])    a. Carotid Dopplers performed in August 2013 showed 40-59% left stenosis and 0-39% right; f/u recommended in 2 years.   . Cerebrovascular accident [facility]) 2009   a. LMCA infarct felt embolic 5643, maintained on chronic coumadin.; denies residual on 04/05/2013  . CHF (congestive heart failure) ([place])   . Cholelithiasis   . Chronic cough onset 03/2010   Dr Melvyn Novas  . Chronic Persistent Atrial Fibrillation 12/31/2008   Qualifier: Diagnosis of  By: [name]    . Coronary artery disease 05/2002   a. Ant MI 2003 s/p PTCA/stent to RCA.   . Diverticulosis of colon   . Esophagitis, reflux   . ESRD (end stage renal disease) ([place])    a. Mass on L kidney per pt s/p nephrectomy - pt states not cancer - [facility] notes indicate ESRD due to HTN/DM - was previously on HD. b. Kidney transplant 02/2011.  Marland Kitchen GERD (gastroesophageal reflux disease)   . Gout   . Helicobacter pylori (H. pylori) infection    hx of  . Hemorrhoids   . Hx of colonic polyps    adenomatous  . Hyperlipidemia   . Hypertension   . Lung nodule seen on imaging study 04/07/2013   1.0 cm ground glass opacity RUL  . Myocardial infarction ([place]) 2003  . Pericardial effusion    a. Small by echo 11/2011.  Marland Kitchen Pruritic dermatitis    treated with steriods/UV light  . S/P aortic valve replacement with bioprosthetic valve and maze procedure 04/12/2013   32mm [facility] Ease bovine pericardial tissue valve   . S/P Maze operation for atrial fibrillation 04/12/2013   Complete bilateral atrial lesion set using bipolar radiofrequency and cryothermy ablation with clipping of LA appendage  .  Sleep apnea    Pt says testing was positive, intolerant of CPAP.  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  . Type II diabetes mellitus ([place])   . Unspecified hearing loss     Assessment: [name] is a 73yo female admitted with CHF exacerbation. She is on warfarin PTA for atrial fibrillation and hx of bioprosethetic aortic valve replacement. INR today on admission was subtherapeutic at 1.6. CBC WNL. PTA warfarin regimen just recently changed to 5mg  daily. Last dose of warfarin was 6/1 PM.   Goal of Therapy:  INR 2-3 Monitor platelets by  anticoagulation protocol: Yes   Plan:  Warfarin 7.5mg  x1 tonight Daily INR and monitor for s/sx of bleeding  Thank you for involving pharmacy in this patient's care.  Janae Bridgeman, PharmD PGY1 Pharmacy Resident Phone: [phone] 12/19/2018 9:01 PM

## 2018-12-19 NOTE — ED Notes (Signed)
ED TO INPATIENT HANDOFF REPORT  ED Nurse Name and Phone #: Jinny Blossom [phone]  S Name/Age/Gender Nohemi Shinault 73 y.o. female Room/Bed: 030C/030C  Code Status   Code Status: Prior  Home/SNF/Other Home Patient oriented to: self, place, time and situation Is this baseline? Yes   Triage Complete: Triage complete  Chief Complaint Swelling all over  Triage Note Pt endorses swelling all over with shob for several days. Hx of CHF. Has been taking her fluid pill every other day. Large amount of edema noted. Tachy, and spo2 at 85% on RA. Placed on 2L [place]   Allergies Allergies  Allergen Reactions  . Ibuprofen Nausea And Vomiting  . Sulfamethoxazole-Trimethoprim Itching, Swelling and Rash    Swelling of the face  . Sulfonamide Derivatives Itching, Swelling and Rash    Swelling of the face  . Tape Rash    Paper tape is ok  . Tramadol Nausea And Vomiting  . Doxycycline     nausea  . Hydrocil [Psyllium] Nausea And Vomiting  . Bactrim Itching, Swelling and Rash  . Red Dye Itching and Rash    Level of Care/Admitting Diagnosis ED Disposition    ED Disposition Condition [facility]: [place] [[id number]]  Level of Care: Telemetry Medical [104]  Covid Evaluation: Screening Protocol (No Symptoms)  Diagnosis: Acute respiratory failure with hypoxia [facility]) [[id number]]  Admitting Physician: [place] ERIC J [[id number]]  Attending Physician: [place] ERIC J [[id number]]  Estimated length of stay: past midnight tomorrow  Certification:: I certify this patient will need inpatient services for at least 2 midnights  PT Class (Do Not Modify): Inpatient [101]  PT Acc Code (Do Not Modify): Private [1]       B Medical/Surgery History Past Medical History:  Diagnosis Date  . Adenocarcinoma of lung, stage 1, right ([place]) 11/25/2017  . Anemia, iron deficiency    of chronic disease  . Aortic stenosis    a. Severe AS by echo 11/2012.  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 Asystole ([place])    a. During ENT surgery 2005: developed marked asystole requiring CPR, felt due to vagal reaction (cath nonobst dz).  . Carotid artery disease ([place])    a. Carotid Dopplers performed in August 2013 showed 40-59% left stenosis and 0-39% right; f/u recommended in 2 years.   . Cerebrovascular accident [facility]) 2009   a. LMCA infarct felt embolic 2778, maintained on chronic coumadin.; denies residual on 04/05/2013  . CHF (congestive heart failure) ([place])   . Cholelithiasis   . Chronic cough onset 03/2010   Dr Melvyn Novas  . Chronic Persistent Atrial Fibrillation 12/31/2008   Qualifier: Diagnosis of  By: [name]    . Coronary artery disease 05/2002   a. Ant MI 2003 s/p PTCA/stent to RCA.   . Diverticulosis of colon   . Esophagitis, reflux   . ESRD (end stage renal disease) ([place])    a. Mass on L kidney per pt s/p nephrectomy - pt states not cancer - [facility] notes indicate ESRD due to HTN/DM - was previously on HD. b. Kidney transplant 02/2011.  Marland Kitchen GERD (gastroesophageal reflux disease)   . Gout   . Helicobacter pylori (H. pylori) infection    hx of  . Hemorrhoids   . Hx of colonic polyps    adenomatous  . Hyperlipidemia   . Hypertension   . Lung nodule seen on imaging study 04/07/2013   1.0 cm ground glass opacity  RUL  . Myocardial infarction ([place]) 2003  . Pericardial effusion    a. Small by echo 11/2011.  Marland Kitchen Pruritic dermatitis    treated with steriods/UV light  . S/P aortic valve replacement with bioprosthetic valve and maze procedure 04/12/2013   42mm [facility] Ease bovine pericardial tissue valve   . S/P Maze operation for atrial fibrillation 04/12/2013   Complete bilateral atrial lesion set using bipolar radiofrequency and cryothermy ablation with clipping of LA appendage  . Sleep apnea    Pt says testing was positive, intolerant of CPAP.  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  . Type II diabetes mellitus ([place])   . Unspecified hearing loss    Past Surgical History:  Procedure Laterality Date  . AORTIC VALVE REPLACEMENT N/A 04/12/2013   Procedure: AORTIC VALVE REPLACEMENT (AVR);  Surgeon: Rexene Alberts, MD;  Location: [place];  Service: Open Heart Surgery;  Laterality: N/A;  . ARTERIOVENOUS GRAFT PLACEMENT Left   . ARTERIOVENOUS GRAFT PLACEMENT Left    "I've had 2 on my left; had one removed" (04/05/2013)   . ARTERY EXPLORATION Right 04/11/2013   Procedure: ARTERY EXPLORATION;  Surgeon: Rexene Alberts, MD;  Location: [place];  Service: Open Heart Surgery;  Laterality: Right;  Right carotid artery exploration  . AV FISTULA PLACEMENT Right   . AV FISTULA REPAIR Right    "took it out" ((/18/2014)  . CARDIOVERSION  05/29/2012   Procedure: CARDIOVERSION;  Surgeon: Lelon Perla, MD;  [facility];  Service: Cardiovascular;  Laterality: N/A;  . CHOLECYSTECTOMY  2009   with hernia removal  . CORONARY ANGIOPLASTY WITH STENT PLACEMENT Right    coronary artery  . INSERTION OF DIALYSIS CATHETER Bilateral    "over the years; took them both out" (04/05/2013)  . INTRAOPERATIVE TRANSESOPHAGEAL ECHOCARDIOGRAM N/A 04/11/2013   Procedure: INTRAOPERATIVE TRANSESOPHAGEAL ECHOCARDIOGRAM;  Surgeon: Rexene Alberts, MD;  Location: [place];  Service: Open Heart Surgery;  Laterality: N/A;  . INTRAOPERATIVE TRANSESOPHAGEAL ECHOCARDIOGRAM N/A 04/12/2013   Procedure: INTRAOPERATIVE TRANSESOPHAGEAL ECHOCARDIOGRAM;  Surgeon: Rexene Alberts, MD;  Location: [place];  Service: Open Heart Surgery;  Laterality: N/A;  . KIDNEY TRANSPLANT  03/16/11  . LEFT AND RIGHT HEART CATHETERIZATION WITH CORONARY ANGIOGRAM N/A 04/06/2013   Procedure: LEFT AND RIGHT HEART CATHETERIZATION WITH CORONARY ANGIOGRAM;  Surgeon: Blane Ohara, MD;  Location: [facility] CATH LAB;  Service: Cardiovascular;  Laterality: N/A;   . MAZE N/A 04/12/2013   Procedure: MAZE;  Surgeon: Rexene Alberts, MD;  Location: [place];  Service: Open Heart Surgery;  Laterality: N/A;  . NASAL RECONSTRUCTION WITH SEPTAL REPAIR     "took it out" (04/05/2013)  . NEPHRECTOMY Left 2010   no CA on bx  . TONSILLECTOMY    . TOTAL ABDOMINAL HYSTERECTOMY    . TUBAL LIGATION       A IV Location/Drains/Wounds Patient Lines/Drains/Airways Status   Active Line/Drains/Airways    Name:   Placement date:   Placement time:   Site:   Days:   Peripheral IV 12/19/18 Right Forearm   12/19/18    1630    Forearm   less than 1   Fistula / Graft Left Upper arm   -    -    Upper arm      Fistula / Graft Left Upper arm Arteriovenous fistula   -    -  Upper arm      External Urinary Catheter   12/19/18    1750    -   less than 1   Introducer Subclavian Left   -    -    Subclavian      Incision 04/11/13 Neck Right   04/11/13    1037     2078   Incision 04/12/13 Chest Other (Comment)   04/12/13    0714     2077          Intake/Output Last 24 hours No intake or output data in the 24 hours ending 12/19/18 1851  Labs/Imaging Results for orders placed or performed during the hospital encounter of 12/19/18 (from the past 48 hour(s))  Basic metabolic panel     Status: Abnormal   Collection Time: 12/19/18  4:21 PM  Result Value Ref Range   Sodium 142 135 - 145 mmol/L   Potassium 4.3 3.5 - 5.1 mmol/L   Chloride 100 98 - 111 mmol/L   CO2 32 22 - 32 mmol/L   Glucose, Bld 223 (H) 70 - 99 mg/dL   BUN 31 (H) 8 - 23 mg/dL   Creatinine, Ser 1.89 (H) 0.44 - 1.00 mg/dL   Calcium 9.7 8.9 - 10.3 mg/dL   GFR calc non Af Amer 26 (L) >60 mL/min   GFR calc Af Amer 30 (L) >60 mL/min   Anion gap 10 5 - 15    Comment: Performed at [facility], [address], [place], [place] [address]  CBC with Differential     Status: Abnormal   Collection Time: 12/19/18  4:21 PM  Result Value Ref Range   WBC 7.4 4.0 - 10.5 K/uL   RBC 5.30 (H) 3.87 - 5.11 MIL/uL    Hemoglobin 12.9 12.0 - 15.0 g/dL   HCT 43.5 36.0 - 46.0 %   MCV 82.1 80.0 - 100.0 fL   MCH 24.3 (L) 26.0 - 34.0 pg   MCHC 29.7 (L) 30.0 - 36.0 g/dL   RDW 19.9 (H) 11.5 - 15.5 %   Platelets 205 150 - 400 K/uL    Comment: REPEATED TO VERIFY   nRBC 0.0 0.0 - 0.2 %   Neutrophils Relative % 81 %   Neutro Abs 6.0 1.7 - 7.7 K/uL   Lymphocytes Relative 10 %   Lymphs Abs 0.7 0.7 - 4.0 K/uL   Monocytes Relative 8 %   Monocytes Absolute 0.6 0.1 - 1.0 K/uL   Eosinophils Relative 1 %   Eosinophils Absolute 0.1 0.0 - 0.5 K/uL   Basophils Relative 0 %   Basophils Absolute 0.0 0.0 - 0.1 K/uL   Immature Granulocytes 0 %   Abs Immature Granulocytes 0.02 0.00 - 0.07 K/uL    Comment: Performed at [facility], [name] [address], [place], [place] [address]  Brain natriuretic peptide     Status: Abnormal   Collection Time: 12/19/18  4:21 PM  Result Value Ref Range   B Natriuretic Peptide 1,637.0 (H) 0.0 - 100.0 pg/mL    Comment: Performed at [facility] [address], [place], [place] [address]  Protime-INR     Status: Abnormal   Collection Time: 12/19/18  4:21 PM  Result Value Ref Range   Prothrombin Time 19.7 (H) 11.4 - 15.2 seconds   INR 1.7 (H) 0.8 - 1.2    Comment: (NOTE) INR goal varies based on device and disease states. Performed at [facility], [place]  [address], [place], [place] [address]   SARS Coronavirus 2 (CEPHEID - Performed in [place] [facility] lab), Hosp Order     Status: None   Collection Time: 12/19/18  4:46 PM  Result Value Ref Range   SARS Coronavirus 2 NEGATIVE NEGATIVE    Comment: (NOTE) If result is NEGATIVE SARS-CoV-2 target nucleic acids are NOT DETECTED. The SARS-CoV-2 RNA is generally detectable in upper and lower  respiratory specimens during the acute phase of infection. The lowest  concentration of SARS-CoV-2 viral copies this assay can detect is 250  copies / mL. A negative result does not preclude SARS-CoV-2 infection  and should not be  used as the sole basis for treatment or other  patient management decisions.  A negative result may occur with  improper specimen collection / handling, submission of specimen other  than nasopharyngeal swab, presence of viral mutation(s) within the  areas targeted by this assay, and inadequate number of viral copies  (<250 copies / mL). A negative result must be combined with clinical  observations, patient history, and epidemiological information. If result is POSITIVE SARS-CoV-2 target nucleic acids are DETECTED. The SARS-CoV-2 RNA is generally detectable in upper and lower  respiratory specimens dur ing the acute phase of infection.  Positive  results are indicative of active infection with SARS-CoV-2.  Clinical  correlation with patient history and other diagnostic information is  necessary to determine patient infection status.  Positive results do  not rule out bacterial infection or co-infection with other viruses. If result is PRESUMPTIVE POSTIVE SARS-CoV-2 nucleic acids MAY BE PRESENT.   A presumptive positive result was obtained on the submitted specimen  and confirmed on repeat testing.  While 2019 novel coronavirus  (SARS-CoV-2) nucleic acids may be present in the submitted sample  additional confirmatory testing may be necessary for epidemiological  and / or clinical management purposes  to differentiate between  SARS-CoV-2 and other Sarbecovirus currently known to infect humans.  If clinically indicated additional testing with an alternate test  methodology [phone]) is advised. The SARS-CoV-2 RNA is generally  detectable in upper and lower respiratory sp ecimens during the acute  phase of infection. The expected result is Negative. Fact Sheet for Patients:  StrictlyIdeas.no Fact Sheet for Healthcare Providers: BankingDealers.co.za This test is not yet approved or cleared by the [place] FDA and has been authorized  for detection and/or diagnosis of SARS-CoV-2 by FDA under an Emergency Use Authorization (EUA).  This EUA will remain in effect (meaning this test can be used) for the duration of the COVID-19 declaration under Section 564(b)(1) of the Act, 21 U.S.C. section 360bbb-3(b)(1), unless the authorization is terminated or revoked sooner. Performed at [facility], [place] [address] [place], [place] [address]    Dg Chest Port 1 View  Result Date: 12/19/2018 CLINICAL DATA:  Diffuse soft tissue swelling and shortness of breath for several days. EXAM: PORTABLE CHEST 1 VIEW COMPARISON:  None. FINDINGS: The heart is enlarged but stable. Stable surgical changes from aortic valve replacement surgery. A left atrial closure device is noted. There is moderate central vascular congestion and mild interstitial edema suggesting CHF. Small effusions are also suspected. No definite infiltrates. The bony thorax is grossly intact. IMPRESSION: CHF. Electronically Signed   By: Marijo Sanes M.D.   On: 12/19/2018 16:56    Pending Labs [employer] (From admission, onward)    Start     Ordered   Signed and Corporate treasurer  Daily,  R     Signed and Held   Signed and Held  TSH  Once,   R     Signed and Held   Signed and Held  Hemoglobin A1c  Once,   R    Comments:  To assess prior glycemic control    Signed and Held   Signed and Held  Magnesium  Tomorrow morning,   R     Signed and Held          Vitals/Pain Today's Vitals   12/19/18 1630 12/19/18 1700 12/19/18 1745 12/19/18 1800  BP: (!) 142/66 (!) 132/59 (!) 150/63   Pulse: (!) 58 61 78   Resp: 16 (!) 21 16   Temp:      TempSrc:      SpO2: 97% 97% 95% 97%  PainSc:        Isolation Precautions No active isolations  Medications Medications  furosemide (LASIX) injection 40 mg (40 mg Intravenous Given 12/19/18 1751)    Mobility walks with device     Focused Assessments Pulmonary Assessment Handoff:  Lung sounds:   O2  Device: Nasal Cannula O2 Flow Rate (L/min): 3 L/min      R Recommendations: See Admitting Provider Note  Report given to:   Additional Notes:  Pt always on 3L [place], pt on fluid pill, recent selling to both legs, pt received 40 of IV lasix through 22 gauge in right forearm. Pt on purewick. Pt alert and oriented.

## 2018-12-19 NOTE — ED Triage Notes (Signed)
Pt endorses swelling all over with shob for several days. Hx of CHF. Has been taking her fluid pill every other day. Large amount of edema noted. Tachy, and spo2 at 85% on RA. Placed on 2L [place]

## 2018-12-19 NOTE — ED Notes (Signed)
XR at bedside

## 2018-12-20 ENCOUNTER — Inpatient Hospital Stay (HOSPITAL_COMMUNITY): Payer: Medicare Other

## 2018-12-20 ENCOUNTER — Encounter (HOSPITAL_COMMUNITY): Payer: Self-pay | Admitting: Cardiology

## 2018-12-20 ENCOUNTER — Other Ambulatory Visit: Payer: Self-pay | Admitting: *Deleted

## 2018-12-20 DIAGNOSIS — M7989 Other specified soft tissue disorders: Secondary | ICD-10-CM

## 2018-12-20 DIAGNOSIS — E1121 Type 2 diabetes mellitus with diabetic nephropathy: Secondary | ICD-10-CM

## 2018-12-20 DIAGNOSIS — I361 Nonrheumatic tricuspid (valve) insufficiency: Secondary | ICD-10-CM

## 2018-12-20 DIAGNOSIS — K5900 Constipation, unspecified: Secondary | ICD-10-CM

## 2018-12-20 DIAGNOSIS — Z794 Long term (current) use of insulin: Secondary | ICD-10-CM

## 2018-12-20 DIAGNOSIS — D899 Disorder involving the immune mechanism, unspecified: Secondary | ICD-10-CM

## 2018-12-20 DIAGNOSIS — I340 Nonrheumatic mitral (valve) insufficiency: Secondary | ICD-10-CM

## 2018-12-20 DIAGNOSIS — E669 Obesity, unspecified: Secondary | ICD-10-CM

## 2018-12-20 DIAGNOSIS — I5033 Acute on chronic diastolic (congestive) heart failure: Secondary | ICD-10-CM

## 2018-12-20 DIAGNOSIS — E0865 Diabetes mellitus due to underlying condition with hyperglycemia: Secondary | ICD-10-CM

## 2018-12-20 LAB — PROTIME-INR
INR: 1.8 — ABNORMAL HIGH (ref 0.8–1.2)
Prothrombin Time: 20.4 seconds — ABNORMAL HIGH (ref 11.4–15.2)

## 2018-12-20 LAB — GLUCOSE, CAPILLARY
Glucose-Capillary: 125 mg/dL — ABNORMAL HIGH (ref 70–99)
Glucose-Capillary: 177 mg/dL — ABNORMAL HIGH (ref 70–99)
Glucose-Capillary: 207 mg/dL — ABNORMAL HIGH (ref 70–99)
Glucose-Capillary: 213 mg/dL — ABNORMAL HIGH (ref 70–99)
Glucose-Capillary: 312 mg/dL — ABNORMAL HIGH (ref 70–99)

## 2018-12-20 LAB — MAGNESIUM: Magnesium: 1.8 mg/dL (ref 1.7–2.4)

## 2018-12-20 LAB — BASIC METABOLIC PANEL
Anion gap: 9 (ref 5–15)
BUN: 34 mg/dL — ABNORMAL HIGH (ref 8–23)
CO2: 36 mmol/L — ABNORMAL HIGH (ref 22–32)
Calcium: 9.7 mg/dL (ref 8.9–10.3)
Chloride: 98 mmol/L (ref 98–111)
Creatinine, Ser: 1.95 mg/dL — ABNORMAL HIGH (ref 0.44–1.00)
GFR calc Af Amer: 29 mL/min — ABNORMAL LOW (ref >60)
GFR calc non Af Amer: 25 mL/min — ABNORMAL LOW (ref >60)
Glucose, Bld: 234 mg/dL — ABNORMAL HIGH (ref 70–99)
Potassium: 4 mmol/L (ref 3.5–5.1)
Sodium: 143 mmol/L (ref 135–145)

## 2018-12-20 LAB — ECHOCARDIOGRAM COMPLETE
Height: 61 in
Weight: 2912 oz

## 2018-12-20 MED ORDER — WARFARIN SODIUM 7.5 MG PO TABS
7.5000 mg | ORAL_TABLET | Freq: Once | ORAL | Status: AC
  Administered 2018-12-20: 7.5 mg via ORAL
  Filled 2018-12-20: qty 1

## 2018-12-20 MED ORDER — FUROSEMIDE 10 MG/ML IJ SOLN: 60.0000 mg | Freq: Three times a day (TID) | INTRAMUSCULAR | Status: DC

## 2018-12-20 MED ORDER — FUROSEMIDE 10 MG/ML IJ SOLN
80.0000 mg | Freq: Three times a day (TID) | INTRAMUSCULAR | Status: DC
  Administered 2018-12-20: 80 mg via INTRAVENOUS
  Filled 2018-12-20: qty 8

## 2018-12-20 MED ORDER — INSULIN ASPART 100 UNIT/ML ~~LOC~~ SOLN
0.0000 [IU] | Freq: Every day | SUBCUTANEOUS | Status: DC
  Administered 2018-12-20 – 2018-12-22 (×2): 2 [IU] via SUBCUTANEOUS
  Administered 2018-12-23: 5 [IU] via SUBCUTANEOUS
  Administered 2018-12-24: 2 [IU] via SUBCUTANEOUS

## 2018-12-20 MED ORDER — FUROSEMIDE 10 MG/ML IJ SOLN: 80.0000 mg | Freq: Three times a day (TID) | INTRAMUSCULAR | Status: DC

## 2018-12-20 MED ORDER — POLYETHYLENE GLYCOL 3350 17 G PO PACK
17.0000 g | PACK | Freq: Every day | ORAL | Status: DC
  Administered 2018-12-21 – 2018-12-24 (×4): 17 g via ORAL
  Filled 2018-12-20 (×6): qty 1

## 2018-12-20 MED ORDER — INSULIN ASPART 100 UNIT/ML ~~LOC~~ SOLN
0.0000 [IU] | Freq: Three times a day (TID) | SUBCUTANEOUS | Status: DC
  Administered 2018-12-20: 2 [IU] via SUBCUTANEOUS
  Administered 2018-12-20: 3 [IU] via SUBCUTANEOUS
  Administered 2018-12-21: 2 [IU] via SUBCUTANEOUS
  Administered 2018-12-21: 3 [IU] via SUBCUTANEOUS
  Administered 2018-12-21: 2 [IU] via SUBCUTANEOUS
  Administered 2018-12-22: 5 [IU] via SUBCUTANEOUS
  Administered 2018-12-22 (×2): 2 [IU] via SUBCUTANEOUS
  Administered 2018-12-23: 3 [IU] via SUBCUTANEOUS
  Administered 2018-12-23 – 2018-12-24 (×2): 5 [IU] via SUBCUTANEOUS
  Administered 2018-12-24: 8 [IU] via SUBCUTANEOUS
  Administered 2018-12-25: 3 [IU] via SUBCUTANEOUS
  Administered 2018-12-25: 2 [IU] via SUBCUTANEOUS
  Administered 2018-12-25: 8 [IU] via SUBCUTANEOUS
  Administered 2018-12-26 (×2): 3 [IU] via SUBCUTANEOUS

## 2018-12-20 MED ORDER — CALCIUM CARBONATE ANTACID 500 MG PO CHEW: 1.0000 | CHEWABLE_TABLET | Freq: Three times a day (TID) | ORAL | Status: DC | PRN

## 2018-12-20 NOTE — Progress Notes (Signed)
ANTICOAGULATION CONSULT NOTE - Follow up Pharmacy Consult for warfarin Indication: atrial fibrillation and hx of bioprosthetic aortic valve replacement  Allergies  Allergen Reactions  . Ibuprofen Nausea And Vomiting  . Sulfamethoxazole-Trimethoprim Itching, Swelling and Rash    Swelling of the face  . Sulfonamide Derivatives Itching, Swelling and Rash    Swelling of the face  . Tape Rash    Paper tape is ok  . Tramadol Nausea And Vomiting  . Doxycycline Nausea Only  . Hydrocil [Psyllium] Nausea And Vomiting  . Bactrim Itching, Swelling and Rash  . Red Dye Itching and Rash    Patient Measurements: Height: 5\' 1"  (154.9 cm) Weight: 182 lb (82.6 kg)(scale a) IBW/kg (Calculated) : 47.8  Vital Signs: Temp: 98.9 F (37.2 C) (06/03 0924) Temp Source: Oral (06/03 0924) BP: 134/66 (06/03 1032) Pulse Rate: 53 (06/03 1032)  Labs: Recent Labs    12/19/18 1443 12/19/18 1621 12/20/18 0442  HGB  --  12.9  --   HCT  --  43.5  --   PLT  --  205  --   LABPROT  --  19.7* 20.4*  INR 1.6* 1.7* 1.8*  CREATININE  --  1.89* 1.95*    Estimated Creatinine Clearance: 24.7 mL/min (A) (by C-G formula based on SCr of 1.95 mg/dL (H)).   Medical History: Past Medical History:  Diagnosis Date  . Adenocarcinoma of lung, stage 1, right ([place]) 11/25/2017  . Anemia, iron deficiency    of chronic disease  . Aortic stenosis    a. Severe AS by echo 11/2012.  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 Asystole ([place])    a. During ENT surgery 2005: developed marked asystole requiring CPR, felt due to vagal reaction (cath nonobst dz).  . Carotid artery disease ([place])    a. Carotid Dopplers performed in August 2013 showed 40-59% left stenosis and 0-39% right; f/u recommended in 2 years.   . Cerebrovascular accident [facility]) 2009   a. LMCA infarct felt embolic 1610, maintained on chronic coumadin.; denies residual on 04/05/2013  . CHF (congestive heart failure) ([place])   . Cholelithiasis    . Chronic cough onset 03/2010   Dr Melvyn Novas  . Chronic Persistent Atrial Fibrillation 12/31/2008   Qualifier: Diagnosis of  By: [name]    . Coronary artery disease 05/2002   a. Ant MI 2003 s/p PTCA/stent to RCA.   . Diverticulosis of colon   . Esophagitis, reflux   . ESRD (end stage renal disease) ([place])    a. Mass on L kidney per pt s/p nephrectomy - pt states not cancer - [facility] notes indicate ESRD due to HTN/DM - was previously on HD. b. Kidney transplant 02/2011.  Marland Kitchen GERD (gastroesophageal reflux disease)   . Gout   . Helicobacter pylori (H. pylori) infection    hx of  . Hemorrhoids   . Hx of colonic polyps    adenomatous  . Hyperlipidemia   . Hypertension   . Lung nodule seen on imaging study 04/07/2013   1.0 cm ground glass opacity RUL  . Myocardial infarction ([place]) 2003  . Pericardial effusion    a. Small by echo 11/2011.  Marland Kitchen Pruritic dermatitis    treated with steriods/UV light  . S/P aortic valve replacement with bioprosthetic valve and maze procedure 04/12/2013   78mm [facility] Ease bovine pericardial tissue valve   . S/P Maze operation for atrial fibrillation 04/12/2013   Complete bilateral atrial lesion set using bipolar  radiofrequency and cryothermy ablation with clipping of LA appendage  . Sleep apnea    Pt says testing was positive, intolerant of CPAP.  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  . Type II diabetes mellitus ([place])   . Unspecified hearing loss     Assessment: [name] is a 73yo female admitted with CHF exacerbation. She is on warfarin PTA for atrial fibrillation and hx of bioprosethetic aortic valve replacement. INR on admission 6/2 was subtherapeutic at 1.6. CBC WNL. PTA warfarin regimen just recently increased on 12/04/18 to 5mg  daily. Last dose of warfarin was 12/18/18 at 22:30.  Today  6/3 the INR is 1.8.   Per Epic MAR, the warfarin 7.5 mg was not given due to patient had taken at home.  However PTA med history report was that pt last took dose on 6/1 at 22:30 (takes at bedtime daily).    Goal of Therapy:  INR 2-3 Monitor platelets by anticoagulation protocol: Yes   Plan:  Warfarin 7.5mg  x1 tonight Daily INR and monitor for s/sx of bleeding  Thank you for involving pharmacy in this patient's care.  Nicole Cella, RPh Clinical Pharmacist [phone] Please check AMION for all [place] phone numbers After 10:00 PM, call [place] [phone] 12/20/2018 11:59 AM

## 2018-12-20 NOTE — Consult Note (Signed)
   [facility] CM Inpatient Consult   12/20/2018  [name] [birth year] [id number]   Patient is currently active with [facility] for chronic disease management services.  Patient has been engaged by a [place].  Our community based plan of care has focused on disease management and community resource support.  Patient will receive a post hospital call and will be evaluated for assessments and disease process education.     Inpatient [facility] team member to make aware that [facility] following. Of note, [facility] Care Management services does not replace or interfere with any services that are needed or arranged by inpatient case management or social work.    Plan:  Continue to follow for progress and disposition needs.  Will have [place] [facility] Coordination for follow up.  For additional questions or referrals please contact:  Natividad Brood, RN BSN [facility] Liaison  [phone] business mobile phone Toll free office [phone]  Fax number: [phone] [email or web address]

## 2018-12-20 NOTE — TOC Initial Note (Signed)
Transition of Care [facility]) - Initial/Assessment Note    Patient Details  Name: [name] MRN: [id number] Date of Birth: [birth year]  Transition of Care [facility]) CM/SW Contact:    Sherrilyn Rist Phone Number: [phone] 12/20/2018, 12:38 PM  Clinical Narrative:                 Patient lives at home with family; PCP is Dr Alain Marion; has private insurance with Medicare; [facility] [address]; pt reports no problem getting her medication; DME - rollater at home; pt could benefit from a [name] but she is refusing all [place] agencies at this time. CM will continue to follow for progression of care.  Expected Discharge Plan: Home/Self Care Barriers to Discharge: No Barriers Identified   Patient Goals and CMS Choice Patient states their goals for this hospitalization and ongoing recovery are:: to go back home CMS Medicare.gov Compare Post Acute Care list provided to:: Patient Choice offered to / list presented to : Patient  Expected Discharge Plan and Services Expected Discharge Plan: Home/Self Care In-house Referral: [facility] Discharge Planning Services: CM Consult Post Acute Care Choice: NA Living arrangements for the past 2 months: Single Family Home                 DME Arranged: N/A DME Agency: NA       HH Arranged: Refused [place] Agency: NA        Prior Living Arrangements/Services Living arrangements for the past 2 months: Single Family Home Lives with:: Spouse Patient language and need for interpreter reviewed:: Yes        Need for Family Participation in Patient Care: Yes (Comment)     Criminal Activity/Legal Involvement Pertinent to Current Situation/Hospitalization: No - Comment as needed  Activities of Daily Living      Permission Sought/Granted Permission sought to share information with : Case Manager Permission granted to share information with : Yes, Verbal Permission Granted              Emotional Assessment Appearance:: Appears  younger than stated age Attitude/Demeanor/Rapport: Gracious, Engaged Affect (typically observed): Accepting Orientation: : Oriented to Self, Oriented to  Time, Oriented to Place, Oriented to Situation   Psych Involvement: No (comment)  Admission diagnosis:  Acute on chronic congestive heart failure, unspecified heart failure type [facility]) [I50.9] Patient Active Problem List   Diagnosis Date Noted  . Obesity (BMI 30-39.9) 12/20/2018  . Acute respiratory failure with hypoxia ([place]) 12/19/2018  . Gait disorder 11/22/2018  . History of seizure disorder 09/12/2018  . History of stroke 09/12/2018  . Edema 08/28/2018  . Hypertension secondary to other renal disorders 03/22/2018  . Proteinuria 03/22/2018  . Malignant neoplasm of lung ([place]) 03/22/2018  . Breast pain in female 02/04/2018  . Lung cancer ([place]) 11/30/2017  . Adenocarcinoma of lung, stage 1, right ([place]) 11/25/2017  . Chronic respiratory failure with hypoxia ([place]) 12/27/2016  . Acute on chronic diastolic CHF (congestive heart failure) ([place]) 12/27/2016  . Left upper extremity swelling 12/27/2016  . CKD (chronic kidney disease), stage III ([place]) 12/27/2016  . Pain of left breast 12/27/2016  . Arm muscle atrophy 11/16/2016  . Insomnia 02/25/2016  . Renal cyst 11/21/2015  . Renal mass, right 11/10/2015  . Shoulder pain, right 08/13/2015  . Weight gain 08/12/2014  . Multinodular goiter 05/01/2014  . Encounter for therapeutic drug monitoring 08/14/2013  . S/P aortic valve replacement with bioprosthetic valve and maze procedure 04/12/2013  . Nodule of  right lung 04/07/2013  . Lung nodule seen on imaging study 04/07/2013  . Pain in limb 05/22/2012  . Dermatitis 01/26/2012  . Long term (current) use of anticoagulants 08/17/2011  . Anemia in chronic renal disease 05/13/2011  . Immunosuppression ([place]) 04/26/2011  . Hypertension associated with diabetes ([place]) 04/26/2011  . Renal transplant recipient 03/16/2011  . Low back pain  02/23/2011  . Hypersalivation 11/24/2010  . AORTIC STENOSIS 08/20/2010  . DISTURBANCE OF SALIVARY SECRETION 07/28/2010  . NAUSEA 04/14/2010  . SMOKER 10/07/2009  . ALOPECIA 10/07/2009  . CAROTID STENOSIS 03/04/2009  . AF (paroxysmal atrial fibrillation) ([place]) 12/31/2008  . NEOPLASM, MALIGNANT, KIDNEY 07/02/2008  . APHASIA DUE TO CEREBROVASCULAR DISEASE 07/02/2008  . Chronic fatigue 05/21/2008  . HYPERLIPIDEMIA 09/27/2007  . Constipation 09/27/2007  . SLEEP APNEA 09/27/2007  . CHOLELITHIASIS 06/09/2007  . HELICOBACTER PYLORI INFECTION, HX OF 06/08/2007  . Type 2 diabetes mellitus with renal manifestations ([place]) 05/23/2007  . Gout 05/23/2007  . 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 MYOCARDIAL INFARCTION, HX OF 05/23/2007  . Coronary atherosclerosis 05/23/2007  . GERD 05/23/2007  . COLONIC POLYPS, HX OF 05/23/2007  . DIVERTICULOSIS, COLON 07/01/2005   PCP:  [name], MD Pharmacy:   [place], [address] [place] [address] Phone: [phone] Fax: [phone]  [id number] [place], [place] [place] [address] Phone: [phone] Fax: [phone]     Social Determinants of Health ([place]) Interventions    Readmission Risk Interventions No flowsheet data found.

## 2018-12-20 NOTE — Progress Notes (Addendum)
Progress Note    [name]  CZY:[id number] DOB: [birth year]  DOA: 12/19/2018 PCP: [name], MD    Brief Narrative:   Chief complaint: Follow-up shortness of breath secondary to decompensated CHF  Medical records reviewed and are as summarized below:  [name] is an 73 y.o. female with a PMH of CAD status post PCI to the RCA, atrial fibrillation on Coumadin, aortic valve stenosis status post AVR, and stage III CKD with history of renal transplantation 2012, IDDM, and chronic diastolic CHF who was admitted on 12/19/2018 for evaluation of progressive shortness of breath associated with worsening peripheral edema in the setting of reduction of her diuretic dose dating back to January.  At 20+ pound weight gain and despite increasing her torsemide to 100 mg twice daily for the past few weeks, she has not had any relief.  ED, she was noted to be hypoxic with a room air saturation of 86%.  Chest x-ray showed cardiomegaly with moderate vascular congestion and interstitial edema.  EKG showed sinus rhythm with T wave inversions that were not new.  Assessment/Plan:   Principal Problem:   Acute on chronic diastolic CHF (congestive heart failure) (HCC) with acute hypoxic respiratory failure Admitted and placed on high doses of diuretics given failure of outpatient treatment.  EKG personally reviewed and shows sinus rhythm with PACs, Q waves in the anterior lead V1, and lateral T wave abnormalities concerning for ischemia.  Chest x-ray personally reviewed and shows interstitial edema and cardiomegaly.  Remains hypoxic with oxygen saturations of only 90% despite therapy.  Only 500 cc net output noted despite aggressive diuresis.  Weight down about 1 kg.  Still massively volume overloaded.  Current regimen is Lasix 40 mg 3 times daily with a dose of metolazone given yesterday.  Remains tachypneic.  Cardiology consultation apparently requested yesterday but I do not see that anyone has been by  yet.  2D echo showed EF 60-65% with mild basal septal hypertrophy.  Left ventricular diastolic function could not be evaluated.  Will increase Lasix to 60 mg 3 times daily.  Needs close monitoring of renal function with this switch.  Still requiring oxygen.  Active Problems:   Left upper extremity swelling Previous AV fistula in place.  Edema is worse than her baseline.  Left upper extremity venous duplex ultrasound ordered.    Paroxysmal atrial Fibrillation/status post aortic valve replacement with bioprosthetic valve and maze procedure History of Maze procedure 2014.  Twelve-lead EKG personally reviewed and showed normal sinus rhythm.  On chronic anticoagulation with Coumadin, INR 1.8.  Continue rate control with metoprolol.  Repeat 2D echocardiogram showed a stable AV bioprosthesis.    Renal transplant recipient/immunosuppression/CKD (chronic kidney disease), stage III [facility]) The patient is followed by Dr. Joelyn Oms of nephrology and Dr. Roxy Horseman at [facility].  Creatinine on admission 1.89 with baseline creatinine varying from 1.2-2.25.  Continue immunosuppressive therapy with prednisone, Prograf, and tacrolimus.  Monitor renal function carefully while on aggressive diuresis.  Because of her underlying comorbidities, it would be unsafe to attempt to diuresis in the outpatient setting.    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  Repeated and is currently 7.9.  Currently being managed with Lantus 15 units and insulin sensitive SSI.  CBGs 213-312.  Will change SSI to moderate scale.    CAD status post PCI History of PCI to the RCA  in 09/27/2012.  Continue statin and beta-blocker therapy.    Hypothyroidism Continue Synthroid.  TSH WNL.    Essential hypertension Continue lisinopril, metoprolol and amlodipine.  Blood pressure controlled.    Obesity Body mass index is  34.39 kg/m.     Constipation We will add MiraLAX 17 g daily.   Family Communication/Anticipated D/C date and plan/Code Status   DVT prophylaxis: Coumadin ordered. Code Status: Full Code.  Family Communication: Husband called but no answer. Unable to leave message. Disposition Plan: Home when fully diuresed, likely 2-3 days.   Medical Consultants:    None.   Anti-Infectives:    None  Subjective:   Mrs. [name] still does not feel that she is back to her baseline.  Reports ongoing shortness of breath with chest heaviness.  Reports occasional heartburn symptoms but says that her appetite has been okay.  Reports constipation, bilateral foot pain, and left upper extremity pain and endorses that her left upper extremity is massively swollen over usual baseline.  Objective:    Vitals:   12/19/18 1815 12/19/18 2012-09-27 12/20/18 0022 12/20/18 0422  BP: (!) 110/55 (!) 154/70 120/65 (!) 112/51  Pulse:  (!) 55 98 (!) 54  Resp: (!) 26 18 20 18   Temp:  98.4 F (36.9 C) (!) 97.5 F (36.4 C) 97.8 F (36.6 C)  TempSrc:  Oral Oral Oral  SpO2:  90% 93% 90%  Weight:  83.7 kg  82.6 kg  Height:  5\' 1"  (1.549 m)      Intake/Output Summary (Last 24 hours) at 12/20/2018 0824 Last data filed at 12/20/2018 0430 Gross per 24 hour  Intake 240 ml  Output 800 ml  Net -560 ml   Filed Weights   12/19/18 09/27/12 12/20/18 0422  Weight: 83.7 kg 82.6 kg    Exam: General: No acute distress. Cardiovascular: Heart sounds are irregular. No gallops or rubs. No murmurs. + JVD. Lungs: Decreased breath sounds bilaterally. No rales, rhonchi or wheezes. Abdomen: Soft, nontender, nondistended with normal active bowel sounds. No masses. No hepatosplenomegaly. Neurological: Alert and oriented 3. Moves all extremities 4 with equal strength. Cranial nerves II through XII grossly intact. Skin: Warm and dry. No rashes or lesions. Extremities: No clubbing or cyanosis.  2+ pitting edema to her feet and lower legs.   Massive swelling of the left upper extremity and hand.  Pedal pulses 2+. Psychiatric: Mood and affect are normal. Insight and judgment are fair.   Data Reviewed:   I have personally reviewed following labs and imaging studies:  Labs: Labs show the following:   Basic Metabolic Panel: Recent Labs  Lab 12/19/18 1621 12/20/18 0442  NA 142 143  K 4.3 4.0  CL 100 98  CO2 32 36*  GLUCOSE 223* 234*  BUN 31* 34*  CREATININE 1.89* 1.95*  CALCIUM 9.7 9.7  MG  --  1.8   GFR Estimated Creatinine Clearance: 24.7 mL/min (A) (by C-G formula based on SCr of 1.95 mg/dL (H)).  Coagulation profile Recent Labs  Lab 12/19/18 1443 12/19/18 1621 12/20/18 0442  INR 1.6* 1.7* 1.8*    CBC: Recent Labs  Lab 12/19/18 1621  WBC 7.4  NEUTROABS 6.0  HGB 12.9  HCT 43.5  MCV 82.1  PLT 205   CBG: Recent Labs  Lab 12/20/18 0005 12/20/18 0608  GLUCAP 312* 213*   Hgb A1c: Recent Labs    12/19/18 09/27/29  HGBA1C 7.9*   Thyroid function studies: Recent Labs    12/19/18 09/27/2129  TSH 1.645  Microbiology Recent Results (from the past 240 hour(s))  SARS Coronavirus 2 (CEPHEID - Performed in [place] hospital lab), Hosp Order     Status: None   Collection Time: 12/19/18  4:46 PM  Result Value Ref Range Status   SARS Coronavirus 2 NEGATIVE NEGATIVE Final    Comment: (NOTE) If result is NEGATIVE SARS-CoV-2 target nucleic acids are NOT DETECTED. The SARS-CoV-2 RNA is generally detectable in upper and lower  respiratory specimens during the acute phase of infection. The lowest  concentration of SARS-CoV-2 viral copies this assay can detect is 250  copies / mL. A negative result does not preclude SARS-CoV-2 infection  and should not be used as the sole basis for treatment or other  patient management decisions.  A negative result may occur with  improper specimen collection / handling, submission of specimen other  than nasopharyngeal swab, presence of viral mutation(s) within the    areas targeted by this assay, and inadequate number of viral copies  (<250 copies / mL). A negative result must be combined with clinical  observations, patient history, and epidemiological information. If result is POSITIVE SARS-CoV-2 target nucleic acids are DETECTED. The SARS-CoV-2 RNA is generally detectable in upper and lower  respiratory specimens dur ing the acute phase of infection.  Positive  results are indicative of active infection with SARS-CoV-2.  Clinical  correlation with patient history and other diagnostic information is  necessary to determine patient infection status.  Positive results do  not rule out bacterial infection or co-infection with other viruses. If result is PRESUMPTIVE POSTIVE SARS-CoV-2 nucleic acids MAY BE PRESENT.   A presumptive positive result was obtained on the submitted specimen  and confirmed on repeat testing.  While 2019 novel coronavirus  (SARS-CoV-2) nucleic acids may be present in the submitted sample  additional confirmatory testing may be necessary for epidemiological  and / or clinical management purposes  to differentiate between  SARS-CoV-2 and other Sarbecovirus currently known to infect humans.  If clinically indicated additional testing with an alternate test  methodology [phone]) is advised. The SARS-CoV-2 RNA is generally  detectable in upper and lower respiratory sp ecimens during the acute  phase of infection. The expected result is Negative. Fact Sheet for Patients:  StrictlyIdeas.no Fact Sheet for Healthcare Providers: BankingDealers.co.za This test is not yet approved or cleared by the [place] FDA and has been authorized for detection and/or diagnosis of SARS-CoV-2 by FDA under an Emergency Use Authorization (EUA).  This EUA will remain in effect (meaning this test can be used) for the duration of the COVID-19 declaration under Section 564(b)(1) of the Act, 21  U.S.C. section 360bbb-3(b)(1), unless the authorization is terminated or revoked sooner. Performed at [facility], [place] [address], [place], [place] [address]     Procedures and diagnostic studies:  Dg Chest Port 1 View  Result Date: 12/19/2018 CLINICAL DATA:  Diffuse soft tissue swelling and shortness of breath for several days. EXAM: PORTABLE CHEST 1 VIEW COMPARISON:  None. FINDINGS: The heart is enlarged but stable. Stable surgical changes from aortic valve replacement surgery. A left atrial closure device is noted. There is moderate central vascular congestion and mild interstitial edema suggesting CHF. Small effusions are also suspected. No definite infiltrates. The bony thorax is grossly intact. IMPRESSION: CHF. Electronically Signed   By: Marijo Sanes M.D.   On: 12/19/2018 16:56    Medications:    amLODipine  5 mg Oral Daily   dapsone  25 mg Oral  Daily   furosemide  40 mg Intravenous TID   gabapentin  100 mg Oral BID   insulin aspart  0-5 Units Subcutaneous QHS   insulin aspart  0-9 Units Subcutaneous TID WC   insulin glargine  15 Units Subcutaneous QHS   levothyroxine  50 mcg Oral Q0600   lisinopril  5 mg Oral QHS   metoprolol tartrate  12.5 mg Oral BID   predniSONE  5 mg Oral Daily   rosuvastatin  20 mg Oral Daily   sodium chloride flush  3 mL Intravenous Q12H   tacrolimus  3 mg Oral BID   warfarin  7.5 mg Oral Once   Warfarin - Pharmacist Dosing Inpatient   Does not apply q1800   Continuous Infusions:  sodium chloride       LOS: 1 day   [place]  [facility]  *Please refer to [email or web address], password [email or web address] to get updated schedule on who will round on this patient, as hospitalists switch teams weekly. If 7PM-7AM, please contact night-coverage at [email or web address], password [email or web address] for any overnight needs.  12/20/2018, 8:24 AM

## 2018-12-20 NOTE — Patient Outreach (Signed)
[name]) Care Management  12/20/2018  [name] 02/18/1946 [id number]   RN Health CoachPatient Concern/Question Outreach  Referral Date:01/12/2017 Referral Source:[facility] RN Case Manager Reason for Referral:CHF education Insurance:Medicare   Outreach Attempt:  Received notification patient hospitalized at [facility] on [address] for a Heart Failure Exacerbation.  Receiving IV diuresis.  RN Health Coach set message to [facility] for assessment of discharge needs.  Plan:  RN Health Coach will await recommendations from [place] concerning patient program needs upon discharge.  [place] [phone] [email or web address]

## 2018-12-20 NOTE — Progress Notes (Signed)
LUE venous duplex       has been completed. Preliminary results can be found under CV proc through chart review. June Leap, BS, RDMS, RVT

## 2018-12-20 NOTE — Consult Note (Addendum)
Cardiology Consultation:   [name] MRN: [id number]; DOB: April 16, 1946  Admit date: 12/19/2018 Date of Consult: 12/20/2018  Primary Care Provider: [name], MD Primary Cardiologist: [name], MD   Patient Profile:   [name] is a 73 y.o. female with a hx of PMH of CAD s/p PCI of the RCA in 2003,  atrial fibrillation on coumadin, severe AS s/p AVR with pericardial tissue valve and MAZE procedure in 2014, HTN, chronic diastolic CHF, HLD, PAD followed by Dr. 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 Rockne Menghini.   She underwent cardiac catheterization in September 2014 for severe AS. The right coronary was totally occluded but no other coronary disease noted.Patient had aortic valve replacement with pericardial tissue valve and also had Maze procedure. Carotid Dopplers September 2018 showed 1 to 39% bilateral stenosis. Abdominal ultrasound October 2018 showed no abdominal aortic aneurysm and no focal stenosis in the aorta or iliacs; abnormal ABIs; Echocardiogram May 2019 showed normal LV function, aortic valve prosthesis with mean gradient 28 mmHg and severe biatrial enlargement.  Nuclear study May 2019 showed ejection fraction 59% and normal perfusion.  Venous Dopplers July 2019 showed no DVT.  History of Present Illness:   [name] has shortness of breath and fluid retention for past 2 to 3 weeks.  Call office and her torsemide has been increased.  Continues to have fluid overload.  Seen in office yesterday by Roby Lofts, PA for volume overload and felt needed IV diuresis as well as left upper extremity Doppler to rule out DVT at her fistula.  Chest showed vascular congestion.  Noted hypoxic at oxygen saturation of 86%.  1637.  Creatinine 1.89 >> 1.95.  COVID negative .  Hemoglobin A1c 7.9.  TSH normal.   Admitted for IV diuresis with approximately 25 pound weight gain.  No DVT on upper extremity by Doppler. BP soft low this afternoon.   Echo today showed IMPRESSIONS    1. The left ventricle has normal systolic function with an ejection fraction of 60-65%. The cavity size was mildly dilated. Mild basal septal hyperteophy. Left ventricular diastolic function could not be evaluated due to nondiagnostic images. No  evidence of left ventricular regional wall motion abnormalities.  2. The right ventricle has normal systolic function. The cavity was mildly enlarged. There is no increase in right ventricular wall thickness. Right ventricular systolic pressure is moderately elevated with an estimated pressure of 44.1 mmHg.  3. Left atrial size was severely dilated.  4. Right atrial size was moderately dilated.  5. Stable AV bioprosthesis that appears to be functioning normally. The mean AV gradient is 76mmHg which is down from 31mmHg a year ago. There is no perivalvular AI.  6. The mitral valve is degenerative. Mild thickening of the anterior mitral valve leaflet. Mild calcification of the anterior mitral valve leaflet. There is moderate to severe mitral annular calcification present.  7. The inferior vena cava was dilated in size with <50% respiratory variability.  8. Compared to last echo the PASP has decreased from 63mmHg to 63mmHg.    Past Medical History:  Diagnosis Date   Adenocarcinoma of lung, stage 1, right ([place]) 11/25/2017   Anemia, iron deficiency    of chronic disease   Aortic stenosis    a. Severe AS by echo 11/2012.   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 During ENT surgery 2005: developed marked asystole requiring CPR, felt due to vagal reaction (cath nonobst dz).   Carotid artery disease ([place])    a. Carotid Dopplers performed in August 2013 showed 40-59% left stenosis and 0-39% right; f/u recommended in 2 years.    Cerebrovascular  accident [facility]) 2009   a. LMCA infarct felt embolic 2751, maintained on chronic coumadin.; denies residual on 04/05/2013   CHF (congestive heart failure) (HCC)    Cholelithiasis    Chronic cough onset 03/2010   Dr Melvyn Novas   Chronic Persistent Atrial Fibrillation 12/31/2008   Qualifier: Diagnosis of  By: [name]     Coronary artery disease 05/2002   a. Ant MI 2003 s/p PTCA/stent to RCA.    Diverticulosis of colon    Esophagitis, reflux    ESRD (end stage renal disease) ([place])    a. Mass on L kidney per pt s/p nephrectomy - pt states not cancer - [facility] notes indicate ESRD due to HTN/DM - was previously on HD. b. Kidney transplant 02/2011.   GERD (gastroesophageal reflux disease)    Gout    Helicobacter pylori (H. pylori) infection    hx of   Hemorrhoids    Hx of colonic polyps    adenomatous   Hyperlipidemia    Hypertension    Lung nodule seen on imaging study 04/07/2013   1.0 cm ground glass opacity RUL   Myocardial infarction [name]) 2003   Pericardial effusion    a. Small by echo 11/2011.   Pruritic dermatitis    treated with steriods/UV light   S/P aortic valve replacement with bioprosthetic valve and maze procedure 04/12/2013   23mm [facility] Ease bovine pericardial tissue valve    S/P Maze operation for atrial fibrillation 04/12/2013   Complete bilateral atrial lesion set using bipolar radiofrequency and cryothermy ablation with clipping of LA appendage   Sleep apnea    Pt says testing was positive, intolerant of CPAP.   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   Type II diabetes mellitus ([place])    Unspecified hearing loss     Past Surgical History:  Procedure Laterality Date   AORTIC VALVE REPLACEMENT N/A 04/12/2013   Procedure: AORTIC VALVE REPLACEMENT (AVR);  Surgeon: Rexene Alberts, MD;   Location: [place];  Service: Open Heart Surgery;  Laterality: N/A;   ARTERIOVENOUS GRAFT PLACEMENT Left    ARTERIOVENOUS GRAFT PLACEMENT Left    "I've had 2 on my left; had one removed" (04/05/2013)    ARTERY EXPLORATION Right 04/11/2013   Procedure: ARTERY EXPLORATION;  Surgeon: Rexene Alberts, MD;  Location: [place];  Service: Open Heart Surgery;  Laterality: Right;  Right carotid artery exploration   AV FISTULA PLACEMENT Right    AV FISTULA REPAIR Right    "took it out" ((/18/2014)   CARDIOVERSION  05/29/2012   Procedure: CARDIOVERSION;  Surgeon: Lelon Perla, MD;  Location[facility] ENDOSCOPY;  Service: Cardiovascular;  Laterality: N/A;   CHOLECYSTECTOMY  2009   with hernia removal   CORONARY ANGIOPLASTY WITH STENT PLACEMENT Right    coronary artery   INSERTION OF DIALYSIS CATHETER Bilateral    "over the years; took them both out" (04/05/2013)   INTRAOPERATIVE TRANSESOPHAGEAL ECHOCARDIOGRAM N/A 04/11/2013   Procedure: INTRAOPERATIVE TRANSESOPHAGEAL ECHOCARDIOGRAM;  Surgeon: Rexene Alberts, MD;  Location: [place];  Service: Open Heart Surgery;  Laterality: N/A;   INTRAOPERATIVE TRANSESOPHAGEAL ECHOCARDIOGRAM N/A 04/12/2013   Procedure: INTRAOPERATIVE TRANSESOPHAGEAL ECHOCARDIOGRAM;  Surgeon: Rexene Alberts, MD;  Location: [place];  Service: Open Heart Surgery;  Laterality: N/A;   KIDNEY TRANSPLANT  03/16/11   LEFT AND RIGHT HEART CATHETERIZATION WITH CORONARY ANGIOGRAM N/A 04/06/2013   Procedure: LEFT AND RIGHT HEART CATHETERIZATION WITH CORONARY ANGIOGRAM;  Surgeon: Blane Ohara, MD;  Location: [facility] CATH LAB;  Service: Cardiovascular;  Laterality: N/A;   MAZE N/A 04/12/2013   Procedure: MAZE;  Surgeon: Rexene Alberts, MD;  Location: [place];  Service: Open Heart Surgery;  Laterality: N/A;   NASAL RECONSTRUCTION WITH SEPTAL REPAIR     "took it out" (04/05/2013)   NEPHRECTOMY Left 2010   no CA on bx   TONSILLECTOMY     TOTAL ABDOMINAL HYSTERECTOMY     TUBAL LIGATION        Inpatient Medications: Scheduled Meds:  amLODipine  5 mg Oral Daily   dapsone  25 mg Oral Daily   furosemide  60 mg Intravenous TID   gabapentin  100 mg Oral BID   insulin aspart  0-15 Units Subcutaneous TID WC   insulin aspart  0-5 Units Subcutaneous QHS   insulin glargine  15 Units Subcutaneous QHS   levothyroxine  50 mcg Oral Q0600   lisinopril  5 mg Oral QHS   metoprolol tartrate  12.5 mg Oral BID   polyethylene glycol  17 g Oral Daily   predniSONE  5 mg Oral Daily   rosuvastatin  20 mg Oral Daily   sodium chloride flush  3 mL Intravenous Q12H   tacrolimus  3 mg Oral BID   warfarin  7.5 mg Oral ONCE-1800   Warfarin - Pharmacist Dosing Inpatient   Does not apply q1800   Continuous Infusions:  sodium chloride     PRN Meds: sodium chloride, acetaminophen, ondansetron (ZOFRAN) IV, sodium chloride flush  Allergies:    Allergies  Allergen Reactions   Ibuprofen Nausea And Vomiting   Sulfamethoxazole-Trimethoprim Itching, Swelling and Rash    Swelling of the face   Sulfonamide Derivatives Itching, Swelling and Rash    Swelling of the face   Tape Rash    Paper tape is ok   Tramadol Nausea And Vomiting   Doxycycline Nausea Only   Hydrocil [Psyllium] Nausea And Vomiting   Bactrim Itching, Swelling and Rash   Red Dye Itching and Rash    Social History:   Social History   Socioeconomic History   Marital status: Married    Spouse name: Not on file   Number of children: 5   Years of education: 15   Highest education level: Not on file  Occupational History   Occupation: retired    Fish farm manager: RETIRED  Scientist, product/process development strain: Not on file   Food insecurity:    Worry: Not on file    Inability: Not on file   Transportation needs:    Medical: Not on file    Non-medical: Not on file  Tobacco Use   Smoking status: Former Smoker    Packs/day: 1.00    Years: 30.00    Pack years: 30.00    Types: Cigarettes    Last  attempt to quit: 07/19/2001    Years since quitting: 17.4   Smokeless tobacco: Never Used  Substance and Sexual Activity   Alcohol use: No  Drug use: No   Sexual activity: Not Currently  Lifestyle   Physical activity:    Days per week: Not on file    Minutes per session: Not on file   Stress: Not on file  Relationships   Social connections:    Talks on phone: Not on file    Gets together: Not on file    Attends religious service: Not on file    Active member of club or organization: Not on file    Attends meetings of clubs or organizations: Not on file    Relationship status: Not on file   Intimate partner violence:    Fear of current or ex partner: Not on file    Emotionally abused: Not on file    Physically abused: Not on file    Forced sexual activity: Not on file  Other Topics Concern   Not on file  Social History Narrative   Patient signed a Designated Party Release to allow her spouse [name] and family and five children to have access to her medical records/information.     Patient lives with husband in a 2 story home.  Has 5 children. Retired from Personal assistant.  Education: 3 years of college.     Family History:   Family History  Problem Relation Age of Onset   Stroke Father    Hypertension Mother    Diabetes Other    Diabetes Maternal Grandmother    Diabetes Son    Breast cancer Neg Hx      ROS:  Please see the history of present illness.  All other ROS reviewed and negative.     Physical Exam/Data:   Vitals:   12/20/18 0022 12/20/18 0422 12/20/18 0924 12/20/18 1032  BP: 120/65 (!) 112/51 (!) 100/27 134/66  Pulse: 98 (!) 54 100 (!) 53  Resp: 20 18 18    Temp: (!) 97.5 F (36.4 C) 97.8 F (36.6 C) 98.9 F (37.2 C)   TempSrc: Oral Oral Oral   SpO2: 93% 90% 92%   Weight:  82.6 kg    Height:        Intake/Output Summary (Last 24 hours) at 12/20/2018 1541 Last data filed at 12/20/2018 1300 Gross per 24 hour  Intake 720 ml  Output 1500  ml  Net -780 ml   Last 3 Weights 12/20/2018 12/19/2018 12/19/2018  Weight (lbs) 182 lb 184 lb 8 oz 188 lb 6.4 oz  Weight (kg) 82.555 kg 83.689 kg 85.458 kg     Body mass index is 34.39 kg/m.  General:  Well nourished, well developed, in no acute distres HEENT: normal Lymph: no adenopathy Neck: no JVD Endocrine:  No thryomegaly Vascular: No carotid bruits; FA pulses 2+ bilaterally without bruits  Cardiac:  normal S1, S2; RRR; no Upper extremity swelling.  2+ bilateral lower extremity edema Lungs: Faint crackles Abd: soft, nontender, no hepatomegaly  Ext: Left upper extremity swelling.  2+ bilateral lower extremity Musculoskeletal:  No deformities, BUE and BLE strength normal and equal Skin: warm and dry  Neuro:  CNs 2-12 intact, no focal abnormalities noted Psych:  Normal affect   EKG:  The EKG was personally reviewed and demonstrates: Sinus bradycardia at rate of 50 bpm and T wave inversion in lateral lead Telemetry:  Telemetry was personally reviewed and demonstrates: Sinus bradycardia at 50  Relevant CV Studies: As above  Laboratory Data:  Chemistry Recent Labs  Lab 12/19/18 1621 12/20/18 0442  NA 142 143  K 4.3 4.0  CL  100 98  CO2 32 36*  GLUCOSE 223* 234*  BUN 31* 34*  CREATININE 1.89* 1.95*  CALCIUM 9.7 9.7  GFRNONAA 26* 25*  GFRAA 30* 29*  ANIONGAP 10 9    No results for input(s): PROT, ALBUMIN, AST, ALT, ALKPHOS, BILITOT in the last 168 hours. Hematology Recent Labs  Lab 12/19/18 1621  WBC 7.4  RBC 5.30*  HGB 12.9  HCT 43.5  MCV 82.1  MCH 24.3*  MCHC 29.7*  RDW 19.9*  PLT 205    BNP Recent Labs  Lab 12/19/18 1621  BNP 1,637.0*    Radiology/Studies:  Dg Chest Port 1 View  Result Date: 12/19/2018 CLINICAL DATA:  Diffuse soft tissue swelling and shortness of breath for several days. EXAM: PORTABLE CHEST 1 VIEW COMPARISON:  None. FINDINGS: The heart is enlarged but stable. Stable surgical changes from aortic valve replacement surgery. A left  atrial closure device is noted. There is moderate central vascular congestion and mild interstitial edema suggesting CHF. Small effusions are also suspected. No definite infiltrates. The bony thorax is grossly intact. IMPRESSION: CHF. Electronically Signed   By: Marijo Sanes M.D.   On: 12/19/2018 16:56   Vas [place] Upper Extremity Venous Duplex  Result Date: 12/20/2018 UPPER VENOUS STUDY  Indications: Swelling Performing Technologist: June Leap RDMS, RVT  Examination Guidelines: A complete evaluation includes B-mode imaging, spectral Doppler, color Doppler, and power Doppler as needed of all accessible portions of each vessel. Bilateral testing is considered an integral part of a complete examination. Limited examinations for reoccurring indications may be performed as noted.  Right Findings: +----------+------------+---------+-----------+----------+-------+  RIGHT      Compressible Phasicity Spontaneous Properties Summary  +----------+------------+---------+-----------+----------+-------+  Subclavian                 Yes        Yes                         +----------+------------+---------+-----------+----------+-------+  Left Findings: +----------+------------+---------+-----------+----------+---------------------+  LEFT       Compressible Phasicity Spontaneous Properties        Summary         +----------+------------+---------+-----------+----------+---------------------+  IJV                        Yes        Yes                 unable to compress                                                                due to increased                                                                  venous pressure     +----------+------------+---------+-----------+----------+---------------------+  Subclavian                 Yes        Yes  unable to compress                                                                due to increased                                                                   venous pressure     +----------+------------+---------+-----------+----------+---------------------+  Axillary       Full        Yes        Yes                                       +----------+------------+---------+-----------+----------+---------------------+  Brachial       Full        Yes        Yes                                       +----------+------------+---------+-----------+----------+---------------------+  Radial         Full                                                             +----------+------------+---------+-----------+----------+---------------------+  Ulnar          Full                                                             +----------+------------+---------+-----------+----------+---------------------+  Cephalic       Full                                                             +----------+------------+---------+-----------+----------+---------------------+  Basilic        Full                                                             +----------+------------+---------+-----------+----------+---------------------+ HD access patent.  Summary:  Right: No evidence of thrombosis in the subclavian.  Left: No evidence of deep vein thrombosis in the upper extremity. No evidence of superficial vein thrombosis in the upper extremity.  *See table(s) above for measurements and observations.  Diagnosing physician: Curt Jews MD Electronically signed by Curt Jews MD on 12/20/2018 at 2:35:12 PM.    Final     Assessment and Plan:   1.  Acute on chronic diastolic heart failure -Failed outpatient therapy.  BNP greater than 1600.  Chest x-ray suggesting CHF.  Echocardiogram this admission showed LV function of 60 to 65%, unable to evaluate diastolic function, right ventricular pressure minimally elevated at 44.1 mmHg and normal functioning AV prosthesis. -Net I&O 560 cc however not accurately measuring output.  Her breathing has improved since yesterday but still requiring  oxygen. -Continue IV diuresis and watch renal function closely.  2.  ESRD s/p renal transplant -Seems baseline creatinine around 1.7.  Creatinine of 1.89>>1.95.  Follow closely with diuresis.  May require nephrology consult if significant bump. Hold Lisinopril. 3.  Status post AVR -Normal functioning valve per echocardiogram  4.  Paroxysmal atrial fibrillation -Maintaining sinus rhythm with PAC at rate of 50s.  May hold metoprolol 12.5 mg twice daily if bradycardia persists.  On Coumadin for pharmacy for anticoagulation.  5.  Hypertension -Blood pressure running low this afternoon.  May consider holding amlodipine/lisinopril.  6. DM - Per primary team    For questions or updates, please contact [place] Please consult [email or web address] for contact info under     Jarrett Soho, PA  12/20/2018 3:41 PM   History and all data above reviewed.  Patient examined.  I agree with the findings as above.  The patient presents with weight gain, lower extremity edema and shortness of breath.  This is been slowly progressive despite increased diuretics.  Her torsemide was increased from every other day 100 mg 2 every day.  In fact she says she was taking 200 mg every day.  She was continued to have increasing shortness of breath with lower extremity swelling.  On further questioning her husband does the cooking.  He cooks sausage and bacon and ham.  She eats at all and complains about the salt content.  She also drinks a lot of water to try to flush out the fluid.  She is not describing any new chest pressure, neck or arm discomfort.  She has had no new palpitations, presyncope or syncope.  She has had some increasing left arm swelling.  She is had an AV fistula in that arm.  The patient exam reveals COR:RRR systolic murmur 2 out of 6 apical, no diastolic,  Lungs: Decreased breath sounds with bilateral basilar crackles,  Abd: Positive bowel sounds no rebound or guarding, Ext moderate lower  extremity edema, left arm nonpitting edema.  All available labs, radiology testing, previous records reviewed. Agree with documented assessment and plan.  ACUTE DIASTOLIC HF: At this point I thought preliminarily it might of been a failure of diuretic.  However, on careful questioning this is her having some difficulty understanding the fluid restriction is necessary and adhering to salt restriction.  She does need IV diuresis and we will increase to 80 mg 3 times daily IV.  Ultimately I do not think she will need a change to her regimen although we could consider adding Zaroxolyn if in the future she is adherent to salt and fluid restriction and needs more aggressive diuretic management.  Jeneen Rinks Natina Wiginton  6:18 PM  12/20/2018

## 2018-12-20 NOTE — Progress Notes (Signed)
  Echocardiogram 2D Echocardiogram has been performed.  [name] 12/20/2018, 9:34 AM

## 2018-12-20 NOTE — Plan of Care (Signed)
  Problem: Activity: Goal: Capacity to carry out activities will improve Outcome: Progressing   Problem: Clinical Measurements: Goal: Respiratory complications will improve Outcome: Progressing   Problem: Activity: Goal: Risk for activity intolerance will decrease Outcome: Progressing   Problem: Coping: Goal: Level of anxiety will decrease Outcome: Progressing   Problem: Elimination: Goal: Will not experience complications related to bowel motility Outcome: Progressing Goal: Will not experience complications related to urinary retention Outcome: Progressing   Problem: Safety: Goal: Ability to remain free from injury will improve Outcome: Progressing

## 2018-12-20 NOTE — Progress Notes (Signed)
Attempted LUE venous duplex. RN requests to try at a later time.  Will check back as schedule permits.   June Leap, BS, RDMS, RVT Vascular lab

## 2018-12-21 ENCOUNTER — Encounter (HOSPITAL_COMMUNITY): Payer: Self-pay | Admitting: General Practice

## 2018-12-21 LAB — BASIC METABOLIC PANEL
Anion gap: 10 (ref 5–15)
BUN: 41 mg/dL — ABNORMAL HIGH (ref 8–23)
CO2: 36 mmol/L — ABNORMAL HIGH (ref 22–32)
Calcium: 10 mg/dL (ref 8.9–10.3)
Chloride: 95 mmol/L — ABNORMAL LOW (ref 98–111)
Creatinine, Ser: 2.38 mg/dL — ABNORMAL HIGH (ref 0.44–1.00)
GFR calc Af Amer: 23 mL/min — ABNORMAL LOW (ref >60)
GFR calc non Af Amer: 19 mL/min — ABNORMAL LOW (ref >60)
Glucose, Bld: 182 mg/dL — ABNORMAL HIGH (ref 70–99)
Potassium: 3.7 mmol/L (ref 3.5–5.1)
Sodium: 141 mmol/L (ref 135–145)

## 2018-12-21 LAB — PROTIME-INR
INR: 2.1 — ABNORMAL HIGH (ref 0.8–1.2)
Prothrombin Time: 22.8 seconds — ABNORMAL HIGH (ref 11.4–15.2)

## 2018-12-21 LAB — TROPONIN I
Troponin I: 0.1 ng/mL (ref <0.03)
Troponin I: 0.08 ng/mL (ref <0.03)

## 2018-12-21 LAB — GLUCOSE, CAPILLARY
Glucose-Capillary: 145 mg/dL — ABNORMAL HIGH (ref 70–99)
Glucose-Capillary: 132 mg/dL — ABNORMAL HIGH (ref 70–99)
Glucose-Capillary: 165 mg/dL — ABNORMAL HIGH (ref 70–99)
Glucose-Capillary: 193 mg/dL — ABNORMAL HIGH (ref 70–99)

## 2018-12-21 MED ORDER — FUROSEMIDE 10 MG/ML IJ SOLN
40.0000 mg | Freq: Three times a day (TID) | INTRAMUSCULAR | Status: DC
  Administered 2018-12-21: 40 mg via INTRAVENOUS
  Filled 2018-12-21: qty 4

## 2018-12-21 MED ORDER — FUROSEMIDE 10 MG/ML IJ SOLN
40.0000 mg | Freq: Two times a day (BID) | INTRAMUSCULAR | Status: DC
  Administered 2018-12-21 – 2018-12-26 (×10): 40 mg via INTRAVENOUS
  Filled 2018-12-21 (×10): qty 4

## 2018-12-21 MED ORDER — WARFARIN SODIUM 5 MG PO TABS
5.0000 mg | ORAL_TABLET | Freq: Once | ORAL | Status: AC
  Administered 2018-12-21: 5 mg via ORAL
  Filled 2018-12-21: qty 1

## 2018-12-21 NOTE — Progress Notes (Addendum)
Progress Note    [name]  EXN:[id number] DOB: Nov 24, 1945  DOA: 12/19/2018 PCP: [name], MD    Brief Narrative:   Chief complaint: Follow-up shortness of breath secondary to decompensated CHF  Medical records reviewed and are as summarized below:  [name] is an 73 y.o. female with a PMH of CAD status post PCI to the RCA, atrial fibrillation on Coumadin, aortic valve stenosis status post AVR, and stage III CKD with history of renal transplantation 2012, IDDM, and chronic diastolic CHF who was admitted on 12/19/2018 for evaluation of progressive shortness of breath associated with worsening peripheral edema in the setting of reduction of her diuretic dose dating back to January.  At 20+ pound weight gain and despite increasing her torsemide to 100 mg twice daily for the past few weeks, she has not had any relief.  ED, she was noted to be hypoxic with a room air saturation of 86%.  Chest x-ray showed cardiomegaly with moderate vascular congestion and interstitial edema.  EKG showed sinus rhythm with T wave inversions that were not new.  Assessment/Plan:   Principal Problem:   Acute on chronic diastolic CHF (congestive heart failure) (HCC) with acute hypoxic respiratory failure Admitted and placed on high doses of diuretics given failure of outpatient treatment.  EKG showed sinus rhythm with PACs, Q waves in the anterior lead V1, and lateral T wave abnormalities concerning for ischemia.  Chest x-ray showed interstitial edema and cardiomegaly.  Current regimen is Lasix 80 mg 3 times daily after cardiology evaluated her and adjusted her diuretic therapy yesterday.  2D echo showed EF 60-65% with mild basal septal hypertrophy.  Left ventricular diastolic function could not be evaluated. I/O- 2.5 L.  Weight down about 2 kg overall.  Patient's creatinine is rising however, so we will back off on Lasix (changed to 40 mg IV BID).  Given nonadherence with diet, will ask dietitian to  educate.  Case discussed with Dr. Percival Spanish of cardiology, and he felt the patient continue to need ongoing inpatient management of her heart failure especially given her rising creatinine.     Active Problems:   Left upper extremity swelling Previous AV fistula in place.  Edema is worse than her baseline.  Left upper extremity venous duplex ultrasound negative for DVT, superficial thrombosis.    Paroxysmal atrial Fibrillation/status post aortic valve replacement with bioprosthetic valve and maze procedure History of Maze procedure 2014.  Twelve-lead EKG personally reviewed and showed normal sinus rhythm.  On chronic anticoagulation with Coumadin, INR 1.8.  Continue rate control with metoprolol with stable heart rate noted.  Repeat 2D echocardiogram showed a stable AV bioprosthesis.    Renal transplant recipient/immunosuppression/CKD (chronic kidney disease), stage III [facility]) The patient is followed by Dr. Joelyn Oms of nephrology and Dr. Roxy Horseman at [facility].  Creatinine on admission 1.89 with baseline creatinine varying from 1.2-2.25.  Continue immunosuppressive therapy with prednisone, Prograf, and tacrolimus.  Monitor renal function carefully while on aggressive diuresis.  Because of her underlying comorbidities, it would be unsafe to attempt to diuresis in the outpatient setting.  Creatinine up this morning, so I have reduced her Lasix dosing.    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  Repeated and is currently 7.9.  Currently being managed with Lantus 15 units and moderate scale SSI.  CBGs 125-213.   CAD status post PCI History of  PCI to the RCA in 2014.  Continue statin and beta-blocker therapy.    Hypothyroidism Continue Synthroid.  TSH WNL.    Essential hypertension Continue lisinopril, metoprolol and amlodipine.  Blood pressure controlled.     Obesity Body mass index is 34.09 kg/m.     Constipation Continue MiraLAX 17 g daily.   Family Communication/Anticipated D/C date and plan/Code Status   DVT prophylaxis: Coumadin ordered. Code Status: Full Code.  Family Communication: Husband updated by telephone 12/21/18. Disposition Plan: Home when fully diuresed, and renal function stable likely 1-2 days.   Medical Consultants:    Cardiology   Anti-Infectives:    None  Subjective:   [name] continues to show some improvement.  Left arm swelling down significantly, and lower extremity edema improved.  Only SOB with activity now.   Objective:    Vitals:   12/20/18 2040 12/21/18 0014 12/21/18 0346 12/21/18 0349  BP: (!) 104/46 (!) 142/65 (!) 143/84   Pulse: (!) 56 64 78   Resp: 20 20 (!) 22   Temp: 98.7 F (37.1 C) 98.4 F (36.9 C) 98.6 F (37 C)   TempSrc: Oral Oral Oral   SpO2: 93% 95% 95%   Weight:    81.8 kg  Height:        Intake/Output Summary (Last 24 hours) at 12/21/2018 0825 Last data filed at 12/21/2018 0819 Gross per 24 hour  Intake 1440 ml  Output 3950 ml  Net -2510 ml   Filed Weights   12/19/18 2014 12/20/18 0422 12/21/18 0349  Weight: 83.7 kg 82.6 kg 81.8 kg    Exam: General: No acute distress. Cardiovascular: Heart sounds are irregular. No gallops or rubs. No murmurs. No JVD. Lungs: Decreased breath sounds. No rales, rhonchi or wheezes. Abdomen: Soft, nontender, nondistended with normal active bowel sounds. No masses. No hepatosplenomegaly. Skin: Warm and dry. No rashes or lesions. Extremities: No clubbing or cyanosis. 1+ edema BLE, LUE edema improved to 2+. Pedal pulses 2+.   Data Reviewed:   I have personally reviewed following labs and imaging studies:  Labs: Labs show the following:   Basic Metabolic Panel: Recent Labs  Lab 12/19/18 1621 12/20/18 0442 12/21/18 0415  NA 142 143 141  K 4.3 4.0 3.7  CL 100 98 95*  CO2 32 36* 36*  GLUCOSE 223* 234* 182*  BUN 31* 34*  41*  CREATININE 1.89* 1.95* 2.38*  CALCIUM 9.7 9.7 10.0  MG  --  1.8  --    GFR Estimated Creatinine Clearance: 20.1 mL/min (A) (by C-G formula based on SCr of 2.38 mg/dL (H)).  Coagulation profile Recent Labs  Lab 12/19/18 1443 12/19/18 1621 12/20/18 0442 12/21/18 0415  INR 1.6* 1.7* 1.8* 2.1*    CBC: Recent Labs  Lab 12/19/18 1621  WBC 7.4  NEUTROABS 6.0  HGB 12.9  HCT 43.5  MCV 82.1  PLT 205   CBG: Recent Labs  Lab 12/20/18 0608 12/20/18 1133 12/20/18 1603 12/20/18 2112 12/21/18 0626  GLUCAP 213* 125* 177* 207* 145*   Hgb A1c: Recent Labs    12/19/18 2131  HGBA1C 7.9*   Thyroid function studies: Recent Labs    12/19/18 2131  TSH 1.645    Microbiology Recent Results (from the past 240 hour(s))  SARS Coronavirus 2 (CEPHEID - Performed in [place] hospital lab), Hosp Order     Status: None   Collection Time: 12/19/18  4:46 PM  Result Value Ref Range Status   SARS Coronavirus 2 NEGATIVE NEGATIVE Final  Comment: (NOTE) If result is NEGATIVE SARS-CoV-2 target nucleic acids are NOT DETECTED. The SARS-CoV-2 RNA is generally detectable in upper and lower  respiratory specimens during the acute phase of infection. The lowest  concentration of SARS-CoV-2 viral copies this assay can detect is 250  copies / mL. A negative result does not preclude SARS-CoV-2 infection  and should not be used as the sole basis for treatment or other  patient management decisions.  A negative result may occur with  improper specimen collection / handling, submission of specimen other  than nasopharyngeal swab, presence of viral mutation(s) within the  areas targeted by this assay, and inadequate number of viral copies  (<250 copies / mL). A negative result must be combined with clinical  observations, patient history, and epidemiological information. If result is POSITIVE SARS-CoV-2 target nucleic acids are DETECTED. The SARS-CoV-2 RNA is generally detectable in upper  and lower  respiratory specimens dur ing the acute phase of infection.  Positive  results are indicative of active infection with SARS-CoV-2.  Clinical  correlation with patient history and other diagnostic information is  necessary to determine patient infection status.  Positive results do  not rule out bacterial infection or co-infection with other viruses. If result is PRESUMPTIVE POSTIVE SARS-CoV-2 nucleic acids MAY BE PRESENT.   A presumptive positive result was obtained on the submitted specimen  and confirmed on repeat testing.  While 2019 novel coronavirus  (SARS-CoV-2) nucleic acids may be present in the submitted sample  additional confirmatory testing may be necessary for epidemiological  and / or clinical management purposes  to differentiate between  SARS-CoV-2 and other Sarbecovirus currently known to infect humans.  If clinically indicated additional testing with an alternate test  methodology [phone]) is advised. The SARS-CoV-2 RNA is generally  detectable in upper and lower respiratory sp ecimens during the acute  phase of infection. The expected result is Negative. Fact Sheet for Patients:  StrictlyIdeas.no Fact Sheet for Healthcare Providers: BankingDealers.co.za This test is not yet approved or cleared by the [place] FDA and has been authorized for detection and/or diagnosis of SARS-CoV-2 by FDA under an Emergency Use Authorization (EUA).  This EUA will remain in effect (meaning this test can be used) for the duration of the COVID-19 declaration under Section 564(b)(1) of the Act, 21 U.S.C. section 360bbb-3(b)(1), unless the authorization is terminated or revoked sooner. Performed at [facility], [place] [address] [place], [place] [address]     Procedures and diagnostic studies:  Dg Chest Port 1 View  Result Date: 12/19/2018 CLINICAL DATA:  Diffuse soft tissue swelling and shortness of breath for  several days. EXAM: PORTABLE CHEST 1 VIEW COMPARISON:  None. FINDINGS: The heart is enlarged but stable. Stable surgical changes from aortic valve replacement surgery. A left atrial closure device is noted. There is moderate central vascular congestion and mild interstitial edema suggesting CHF. Small effusions are also suspected. No definite infiltrates. The bony thorax is grossly intact. IMPRESSION: CHF. Electronically Signed   By: Marijo Sanes M.D.   On: 12/19/2018 16:56   Vas [place] Upper Extremity Venous Duplex  Result Date: 12/20/2018 UPPER VENOUS STUDY  Indications: Swelling Performing Technologist: June Leap RDMS, RVT  Examination Guidelines: A complete evaluation includes B-mode imaging, spectral Doppler, color Doppler, and power Doppler as needed of all accessible portions of each vessel. Bilateral testing is considered an integral part of a complete examination. Limited examinations for reoccurring indications may be performed as noted.  Right Findings: +----------+------------+---------+-----------+----------+-------+  RIGHT      Compressible Phasicity Spontaneous Properties Summary  +----------+------------+---------+-----------+----------+-------+  Subclavian                 Yes        Yes                         +----------+------------+---------+-----------+----------+-------+  Left Findings: +----------+------------+---------+-----------+----------+---------------------+  LEFT       Compressible Phasicity Spontaneous Properties        Summary         +----------+------------+---------+-----------+----------+---------------------+  IJV                        Yes        Yes                 unable to compress                                                                due to increased                                                                  venous pressure     +----------+------------+---------+-----------+----------+---------------------+  Subclavian                 Yes        Yes                  unable to compress                                                                due to increased                                                                  venous pressure     +----------+------------+---------+-----------+----------+---------------------+  Axillary       Full        Yes        Yes                                       +----------+------------+---------+-----------+----------+---------------------+  Brachial       Full        Yes        Yes                                       +----------+------------+---------+-----------+----------+---------------------+  Radial         Full                                                             +----------+------------+---------+-----------+----------+---------------------+  Ulnar          Full                                                             +----------+------------+---------+-----------+----------+---------------------+  Cephalic       Full                                                             +----------+------------+---------+-----------+----------+---------------------+  Basilic        Full                                                             +----------+------------+---------+-----------+----------+---------------------+ HD access patent.  Summary:  Right: No evidence of thrombosis in the subclavian.  Left: No evidence of deep vein thrombosis in the upper extremity. No evidence of superficial vein thrombosis in the upper extremity.  *See table(s) above for measurements and observations.  Diagnosing physician: Curt Jews MD Electronically signed by Curt Jews MD on 12/20/2018 at 2:35:12 PM.    Final     Medications:    dapsone  25 mg Oral Daily   furosemide  80 mg Intravenous TID   gabapentin  100 mg Oral BID   insulin aspart  0-15 Units Subcutaneous TID WC   insulin aspart  0-5 Units Subcutaneous QHS   insulin glargine  15 Units Subcutaneous QHS   levothyroxine  50 mcg Oral Q0600   metoprolol  tartrate  12.5 mg Oral BID   polyethylene glycol  17 g Oral Daily   predniSONE  5 mg Oral Daily   rosuvastatin  20 mg Oral Daily   sodium chloride flush  3 mL Intravenous Q12H   tacrolimus  3 mg Oral BID   Warfarin - Pharmacist Dosing Inpatient   Does not apply q1800   Continuous Infusions:  sodium chloride       LOS: 2 days   Juhi Lagrange  [facility]  *Please refer to [email or web address], password [email or web address] to get updated schedule on who will round on this patient, as hospitalists switch teams weekly. If 7PM-7AM, please contact night-coverage at [email or web address], password [email or web address] for any overnight needs.  12/21/2018, 8:25 AM

## 2018-12-21 NOTE — Progress Notes (Signed)
Troponin result called 0.10, NP notified per order. Patient is stable will continue to monitor the patient

## 2018-12-21 NOTE — Plan of Care (Signed)
Nutrition Education Note  RD working remotely.   RD consulted for nutrition education regarding CHF and diabetes.  Lab Results  Component Value Date   HGBA1C 7.9 (H) 12/19/2018   PTA DM medications are 24 units insulin glargine daily.   Labs reviewed: CBGS: 145 (inpatient orders for glycemic control are 0-15 units insulin aspart TID with meals, 0-5 units insulin aspart q HS, and 15 units insulin glargine q HS).    Spoke with pt by phone, who was pleasant and in good spirits. She shares that her appetite has improved greatly and "now I want to eat everything in sight". Pt shares she had a decreased appetite PTA secondary to edema and difficulty breathing.   Per pt, her biggest barrier to adhereing to a low sodium diet is the fact that her husband does the majority of the cooking and grocery shopping for their household. Pt will sometimes cook her herself, however, this is often difficult secondary to her respiratory and mobility status. Pt reports she prefers fresh fruits and vegetables, but often eats what is available for her (pt shared with this RD that she was a vegetarian as a child growing up in [place], however, adapted a [place] when she married her husband approximately 60 years ago). She typically consumes 2 meals per day (Breakfast: coffee and toast; Lunch BLT or grilled cheese sandwich, V-8 juice; Dinner: ham hock, mac and cheese, and collar greens or pulled pork). Focus of education was on low sodium foods that were easy to prepare, such as pre-chopped vegetables or fruits and veggies that do not require a lot of prep (such as baby carrots) or foods that could be made in advance. Pt reports she does not add salt to her foods, but knows that the foods that she does eat are highly processed. She has spoken with her husband regarding need to change eating habits; encouraged continued conversation about this and suggested that they make the grocery list together to include items that they  both prefer to avoid re-admissions.   RD provided "Heart Healthy, Consistent Carbohydrate Nutrition Therapy" handout from the Academy of Nutrition and Dietetics. Reviewed patient's dietary recall. Provided examples on ways to decrease sodium intake in diet. Discouraged intake of processed foods and use of salt shaker. Encouraged fresh fruits and vegetables as well as whole grain sources of carbohydrates to maximize fiber intake.   RD discussed why it is important for patient to adhere to diet recommendations, and emphasized the role of fluids, foods to avoid, and importance of weighing self daily.   Discussed different food groups and their effects on blood sugar, emphasizing carbohydrate-containing foods. Provided list of carbohydrates and recommended serving sizes of common foods.  Discussed importance of controlled and consistent carbohydrate intake throughout the day. Provided examples of ways to balance meals/snacks and encouraged intake of high-fiber, whole grain complex carbohydrates. Teach back method used.  Expect fair compliance.  Body mass index is 34.09 kg/m. Pt meets criteria for obesity, class II based on current BMI.  Current diet order is heart healthy/ carb modified, patient is consuming approximately 100% of meals at this time. Labs and medications reviewed. No further nutrition interventions warranted at this time. RD contact information provided. If additional nutrition issues arise, please re-consult RD.   Ahnaf Caponi A. Jimmye Norman, RD, LDN, [place] Registered Dietitian II Certified Diabetes Care and Education Specialist Pager: [phone] After hours Pager: [phone]

## 2018-12-21 NOTE — Progress Notes (Addendum)
Progress Note  Patient Name: [name] Date of Encounter: 12/21/2018  Primary Cardiologist: [name], MD  Subjective   Patient reports fluid status is improving. Legs much less swollen. SOB improving but not at baseline. She also reports having intermittent heartburn this last week but no chest pain. Last episode of heartburn was last night.  Inpatient Medications    Scheduled Meds:  dapsone  25 mg Oral Daily   furosemide  80 mg Intravenous TID   gabapentin  100 mg Oral BID   insulin aspart  0-15 Units Subcutaneous TID WC   insulin aspart  0-5 Units Subcutaneous QHS   insulin glargine  15 Units Subcutaneous QHS   levothyroxine  50 mcg Oral Q0600   metoprolol tartrate  12.5 mg Oral BID   polyethylene glycol  17 g Oral Daily   predniSONE  5 mg Oral Daily   rosuvastatin  20 mg Oral Daily   sodium chloride flush  3 mL Intravenous Q12H   tacrolimus  3 mg Oral BID   Warfarin - Pharmacist Dosing Inpatient   Does not apply q1800   Continuous Infusions:  sodium chloride     PRN Meds: sodium chloride, acetaminophen, calcium carbonate, ondansetron (ZOFRAN) IV, sodium chloride flush   Vital Signs    Vitals:   12/20/18 2040 12/21/18 0014 12/21/18 0346 12/21/18 0349  BP: (!) 104/46 (!) 142/65 (!) 143/84   Pulse: (!) 56 64 78   Resp: 20 20 (!) 22   Temp: 98.7 F (37.1 C) 98.4 F (36.9 C) 98.6 F (37 C)   TempSrc: Oral Oral Oral   SpO2: 93% 95% 95%   Weight:    81.8 kg  Height:        Intake/Output Summary (Last 24 hours) at 12/21/2018 0759 Last data filed at 12/21/2018 8756 Gross per 24 hour  Intake 1200 ml  Output 3400 ml  Net -2200 ml   Last 3 Weights 12/21/2018 12/20/2018 12/19/2018  Weight (lbs) 180 lb 6.4 oz 182 lb 184 lb 8 oz  Weight (kg) 81.829 kg 82.555 kg 83.689 kg     Telemetry    Irregular HR with difficult to see P waves - sometimes present, other times difficult to ascertain. Either atrial fib or NSR with frequent PACs/PVCs - Personally  Reviewed  ECG    NSR with TWI I, II, avL, V4-V6- Personally Reviewed. She appears to have had a varying degree of TW changes over the years in different patterns.  Physical Exam   GEN: No acute distress.  HEENT: Normocephalic, atraumatic, sclera non-icteric. Neck: No JVD or bruits. Cardiac: Slightly irregular no murmurs, rubs, or gallops.  Radials/DP/PT 1+ and equal bilaterally.  Respiratory: Diffusely diminished bilaterally. Breathing is unlabored on O2 GI: Soft, nontender, non-distended, BS +x 4. MS: no deformity. Extremities: No clubbing or cyanosis. Trace bilateral LE edema. Distal pedal pulses are 2+ and equal bilaterally. LUE still more swollen than RUE. Neuro:  AAOx3. Follows commands. Psych:  Responds to questions appropriately with a normal affect.  Labs    Chemistry Recent Labs  Lab 12/19/18 1621 12/20/18 0442 12/21/18 0415  NA 142 143 141  K 4.3 4.0 3.7  CL 100 98 95*  CO2 32 36* 36*  GLUCOSE 223* 234* 182*  BUN 31* 34* 41*  CREATININE 1.89* 1.95* 2.38*  CALCIUM 9.7 9.7 10.0  GFRNONAA 26* 25* 19*  GFRAA 30* 29* 23*  ANIONGAP 10 9 10      Hematology Recent Labs  Lab 12/19/18 1621  WBC 7.4  RBC 5.30*  HGB 12.9  HCT 43.5  MCV 82.1  MCH 24.3*  MCHC 29.7*  RDW 19.9*  PLT 205    Cardiac EnzymesNo results for input(s): TROPONINI in the last 168 hours. No results for input(s): TROPIPOC in the last 168 hours.   BNP Recent Labs  Lab 12/19/18 1621  BNP 1,637.0*     DDimer No results for input(s): DDIMER in the last 168 hours.   Radiology    Dg Chest Port 1 View  Result Date: 12/19/2018 CLINICAL DATA:  Diffuse soft tissue swelling and shortness of breath for several days. EXAM: PORTABLE CHEST 1 VIEW COMPARISON:  None. FINDINGS: The heart is enlarged but stable. Stable surgical changes from aortic valve replacement surgery. A left atrial closure device is noted. There is moderate central vascular congestion and mild interstitial edema suggesting CHF.  Small effusions are also suspected. No definite infiltrates. The bony thorax is grossly intact. IMPRESSION: CHF. Electronically Signed   By: Marijo Sanes M.D.   On: 12/19/2018 16:56   Vas [place] Upper Extremity Venous Duplex  Result Date: 12/20/2018 UPPER VENOUS STUDY  Indications: Swelling Performing Technologist: June Leap RDMS, RVT  Examination Guidelines: A complete evaluation includes B-mode imaging, spectral Doppler, color Doppler, and power Doppler as needed of all accessible portions of each vessel. Bilateral testing is considered an integral part of a complete examination. Limited examinations for reoccurring indications may be performed as noted.  Right Findings: +----------+------------+---------+-----------+----------+-------+  RIGHT      Compressible Phasicity Spontaneous Properties Summary  +----------+------------+---------+-----------+----------+-------+  Subclavian                 Yes        Yes                         +----------+------------+---------+-----------+----------+-------+  Left Findings: +----------+------------+---------+-----------+----------+---------------------+  LEFT       Compressible Phasicity Spontaneous Properties        Summary         +----------+------------+---------+-----------+----------+---------------------+  IJV                        Yes        Yes                 unable to compress                                                                due to increased                                                                  venous pressure     +----------+------------+---------+-----------+----------+---------------------+  Subclavian                 Yes        Yes                 unable to compress  due to increased                                                                  venous pressure     +----------+------------+---------+-----------+----------+---------------------+  Axillary       Full         Yes        Yes                                       +----------+------------+---------+-----------+----------+---------------------+  Brachial       Full        Yes        Yes                                       +----------+------------+---------+-----------+----------+---------------------+  Radial         Full                                                             +----------+------------+---------+-----------+----------+---------------------+  Ulnar          Full                                                             +----------+------------+---------+-----------+----------+---------------------+  Cephalic       Full                                                             +----------+------------+---------+-----------+----------+---------------------+  Basilic        Full                                                             +----------+------------+---------+-----------+----------+---------------------+ HD access patent.  Summary:  Right: No evidence of thrombosis in the subclavian.  Left: No evidence of deep vein thrombosis in the upper extremity. No evidence of superficial vein thrombosis in the upper extremity.  *See table(s) above for measurements and observations.  Diagnosing physician: Curt Jews MD Electronically signed by Curt Jews MD on 12/20/2018 at 2:35:12 PM.    Final     Cardiac Studies   2D echo 12/20/18 IMPRESSIONS    1. The left ventricle has normal systolic function with an ejection fraction of 60-65%. The cavity size was mildly dilated. Mild basal septal hyperteophy. Left ventricular diastolic function could not  be evaluated due to nondiagnostic images. No  evidence of left ventricular regional wall motion abnormalities.  2. The right ventricle has normal systolic function. The cavity was mildly enlarged. There is no increase in right ventricular wall thickness. Right ventricular systolic pressure is moderately elevated with an estimated pressure of 44.1  mmHg.  3. Left atrial size was severely dilated.  4. Right atrial size was moderately dilated.  5. Stable AV bioprosthesis that appears to be functioning normally. The mean AV gradient is 43mmHg which is down from 55mmHg a year ago. There is no perivalvular AI.  6. The mitral valve is degenerative. Mild thickening of the anterior mitral valve leaflet. Mild calcification of the anterior mitral valve leaflet. There is moderate to severe mitral annular calcification present.  7. The inferior vena cava was dilated in size with <50% respiratory variability.  8. Compared to last echo the PASP has decreased from 110mmHg to 92mmHg.  Patient Profile     73 y.o. female with CAD s/p PCI of the RCAin 2003 (occluded by cath 03/2013, normal nuc 11/2017), atrial fibrillation on coumadin,severe AS s/p AVR with pericardial tissue valve and MAZE procedure in 2014, HTN, chronic diastolic CHF, HLD,PAD followed by Dr. 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 She was seen in office yesterday with volume overload, hypoxia (acute hypoxic respiratory failure), 25lb weight gain and felt to require IV diuresis. UE duplex neg for DVT. 2D echo 12/20/18 EF 60-6%, mildly enlarged RV with moderate pulm HTN (decreased PASP from prior), severe LAE, stable AV prosthesis, calcification of mitral valve. Concern noted for salt and fluid restriction adherence.  Assessment & Plan    1. Acute on chronic diastolic CHF with acute hypoxic respiratory failure requiring supplemental O2 -  Of note, OP CT chest 12/04/18 was suggestive of pulm edema, ascites and body wall edema. Pulm pressures appear improved by f/u echo. Weight down 4.5lb but Cr has bumped with diuresis. Receiving Lasix 80mg  TID and got metolazone 5mg  x 1 yesterday. She is down 2.7L. In light of rise in Cr, will confirm plan for further diuretic with Dr. Percival Spanish. Her Cr in 05/2018 was 2.25 so  baseline might be somewhere between 1.9-1.2. Would likely hold off on additional metolazone for now. May need to consider renal consultation to follow along.  2. ESRD s/p renal transplant with resultant CKD stage III - see above. Avoid nephrotoxic agents.  3. Paroxysmal atrial fibrillation - she is on chronic Coumadin. Pharmacy is managing anticoagulation. Will obtain f/u EKG to assess rhythm given irregularity on telemetry.  4. HTN - lisinopril/amlodipine on hold due to softer BP yesterday, but now stable. Follow for now but if need be, can add back amlodipine.  5. H/o CAD s/p PCI - she is not on ASA presumably due to concomitant warfarin. She is on BB and statin. She denies any CP but does report intermittent "heartburn." EKG with TW changes as above, but has had intermittent TW changes in various territories including ones above as well. Will order f/u EKG this AM (also to clarify rhythm), and obtain troponin.  6. S/p AVR - prosthesis OK by echo. Will need reminder of SBE ppx as OP.  For questions or updates, please contact [place] Please consult [email or web address] for contact info under Cardiology/STEMI.  Signed, Charlie Pitter, PA-C 12/21/2018, 7:59 AM    History and all data above reviewed.  Patient examined.  I  agree with the findings as above.  Breathing probably unchanged although she says at home she was on 3 liters and now she is on 2 with sats of 95%.   The patient exam reveals COR: Regular ,  Lungs: Decreased breath sounds ,  Abd: Positive bowel sounds, no rebound no guarding, Ext Mild/mod edema  .  All available labs, radiology testing, previous records reviewed. Agree with documented assessment and plan.  I am worried about the renal function and will cut back on the Lasix to only BID.  Needs compression stockings and to keep her feet elevated.  Of note the EKG shows atrial fib.     Jeneen Rinks Soila Printup  11:14 AM  12/21/2018

## 2018-12-21 NOTE — Progress Notes (Signed)
Patient oxygen saturation down to 70% on RA at rest. Put patient back on oxygen. Will continue to monitor the patient.

## 2018-12-21 NOTE — Progress Notes (Signed)
ANTICOAGULATION CONSULT NOTE - Follow up Pharmacy Consult for warfarin Indication: atrial fibrillation and hx of bioprosthetic aortic valve replacement  Allergies  Allergen Reactions  . Ibuprofen Nausea And Vomiting  . Sulfamethoxazole-Trimethoprim Itching, Swelling and Rash    Swelling of the face  . Sulfonamide Derivatives Itching, Swelling and Rash    Swelling of the face  . Tape Rash    Paper tape is ok  . Tramadol Nausea And Vomiting  . Doxycycline Nausea Only  . Hydrocil [Psyllium] Nausea And Vomiting  . Bactrim Itching, Swelling and Rash  . Red Dye Itching and Rash    Patient Measurements: Height: 5\' 1"  (154.9 cm) Weight: 180 lb 6.4 oz (81.8 kg) IBW/kg (Calculated) : 47.8  Vital Signs: Temp: 99.9 F (37.7 C) (06/04 1136) Temp Source: Oral (06/04 1136) BP: 168/70 (06/04 1140) Pulse Rate: 49 (06/04 1140)  Labs: Recent Labs    12/19/18 1621 12/20/18 0442 12/21/18 0415 12/21/18 1021 12/21/18 1350  HGB 12.9  --   --   --   --   HCT 43.5  --   --   --   --   PLT 205  --   --   --   --   LABPROT 19.7* 20.4* 22.8*  --   --   INR 1.7* 1.8* 2.1*  --   --   CREATININE 1.89* 1.95* 2.38*  --   --   TROPONINI  --   --   --  0.10* 0.08*    Estimated Creatinine Clearance: 20.1 mL/min (A) (by C-G formula based on SCr of 2.38 mg/dL (H)).   Medical History: Past Medical History:  Diagnosis Date  . Acute CHF (congestive heart failure) ([place]) 12/2018  . Adenocarcinoma of lung, stage 1, right ([place]) 11/25/2017  . Anemia, iron deficiency    of chronic disease  . Aortic stenosis    a. Severe AS by echo 11/2012.  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 Asystole ([place])    a. During ENT surgery 2005: developed marked asystole requiring CPR, felt due to vagal reaction (cath nonobst dz).  . Carotid artery disease ([place])    a. Carotid Dopplers performed in August 2013 showed 40-59% left stenosis and 0-39% right; f/u recommended in 2 years.   . Cerebrovascular  accident [name]) 2009   a. LMCA infarct felt embolic 2426, maintained on chronic coumadin.; denies residual on 04/05/2013  . Cholelithiasis   . Chronic Persistent Atrial Fibrillation 12/31/2008   Qualifier: Diagnosis of  By: [name]    . Coronary artery disease 05/2002   a. Ant MI 2003 s/p PTCA/stent to RCA.   . Diverticulosis of colon   . Esophagitis, reflux   . ESRD (end stage renal disease) ([place])    a. Mass on L kidney per pt s/p nephrectomy - pt states not cancer - [facility] notes indicate ESRD due to HTN/DM - was previously on HD. b. Kidney transplant 02/2011.  Marland Kitchen GERD (gastroesophageal reflux disease)   . Gout   . Helicobacter pylori (H. pylori) infection    hx of  . Hemorrhoids   . Hx of colonic polyps    adenomatous  . Hyperlipidemia   . Hypertension   . Lung nodule seen on imaging study 04/07/2013   1.0 cm ground glass opacity RUL  . Myocardial infarction ([place]) 2003  . Pericardial effusion    a. Small by echo 11/2011.  . S/P aortic valve replacement with bioprosthetic valve and maze procedure  04/12/2013   63mm Edwards Magna Ease bovine pericardial tissue valve   . S/P Maze operation for atrial fibrillation 04/12/2013   Complete bilateral atrial lesion set using bipolar radiofrequency and cryothermy ablation with clipping of LA appendage  . Sleep apnea    Pt says testing was positive, intolerant of CPAP.  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  . Type II diabetes mellitus ([place])     Assessment: [name] is a 73yo female admitted with CHF exacerbation. She is on warfarin PTA for atrial fibrillation and hx of bioprosethetic aortic valve replacement. INR on admission 6/2 was subtherapeutic at 1.6. CBC WNL. PTA warfarin regimen just recently increased on 12/04/18 to 5mg  daily. Last dose of warfarin PTA was 12/18/18 at  22:30.  Warfarin 7.5mg  given 6/3.   Today the INR= 2.1, therapeutic INR.  LUE venous duplex on 6/3: negative No bleeding noted.  Goal of Therapy:  INR 2-3 Monitor platelets by anticoagulation protocol: Yes   Plan:  Warfarin 5mg  x1 tonight Daily INR and monitor for s/sx of bleeding  Thank you for involving pharmacy in this patient's care.  Nicole Cella, RPh Clinical Pharmacist [phone] Please check AMION for all [place] phone numbers After 10:00 PM, call [place] [phone] 12/21/2018 4:10 PM

## 2018-12-22 ENCOUNTER — Inpatient Hospital Stay (HOSPITAL_COMMUNITY): Payer: Medicare Other

## 2018-12-22 LAB — BASIC METABOLIC PANEL
Anion gap: 12 (ref 5–15)
BUN: 44 mg/dL — ABNORMAL HIGH (ref 8–23)
CO2: 37 mmol/L — ABNORMAL HIGH (ref 22–32)
Calcium: 9.9 mg/dL (ref 8.9–10.3)
Chloride: 90 mmol/L — ABNORMAL LOW (ref 98–111)
Creatinine, Ser: 2.34 mg/dL — ABNORMAL HIGH (ref 0.44–1.00)
GFR calc Af Amer: 23 mL/min — ABNORMAL LOW (ref >60)
GFR calc non Af Amer: 20 mL/min — ABNORMAL LOW (ref >60)
Glucose, Bld: 144 mg/dL — ABNORMAL HIGH (ref 70–99)
Potassium: 3.2 mmol/L — ABNORMAL LOW (ref 3.5–5.1)
Sodium: 139 mmol/L (ref 135–145)

## 2018-12-22 LAB — GLUCOSE, CAPILLARY
Glucose-Capillary: 141 mg/dL — ABNORMAL HIGH (ref 70–99)
Glucose-Capillary: 131 mg/dL — ABNORMAL HIGH (ref 70–99)
Glucose-Capillary: 202 mg/dL — ABNORMAL HIGH (ref 70–99)
Glucose-Capillary: 224 mg/dL — ABNORMAL HIGH (ref 70–99)

## 2018-12-22 LAB — PROTIME-INR
INR: 2.1 — ABNORMAL HIGH (ref 0.8–1.2)
Prothrombin Time: 23.3 seconds — ABNORMAL HIGH (ref 11.4–15.2)

## 2018-12-22 MED ORDER — POTASSIUM CHLORIDE CRYS ER 20 MEQ PO TBCR
40.0000 meq | EXTENDED_RELEASE_TABLET | Freq: Once | ORAL | Status: AC
  Administered 2018-12-22: 40 meq via ORAL
  Filled 2018-12-22: qty 2

## 2018-12-22 MED ORDER — WARFARIN SODIUM 5 MG PO TABS
5.0000 mg | ORAL_TABLET | Freq: Every day | ORAL | Status: AC
  Administered 2018-12-22: 5 mg via ORAL
  Filled 2018-12-22: qty 1

## 2018-12-22 NOTE — Progress Notes (Addendum)
Progress Note  Patient Name: [name] Date of Encounter: 12/22/2018  Primary Cardiologist: [name], MD  Subjective   Pt reports episodes of coughing yesterday but none today. Not feeling SOB at rest. Has intermittent "stab" sensation in breast tissue but otherwise no recurrent heartburn reported. Tmax 99.9 in last 24 hrs.  Inpatient Medications    Scheduled Meds: . dapsone  25 mg Oral Daily  . furosemide  40 mg Intravenous BID  . gabapentin  100 mg Oral BID  . insulin aspart  0-15 Units Subcutaneous TID WC  . insulin aspart  0-5 Units Subcutaneous QHS  . insulin glargine  15 Units Subcutaneous QHS  . levothyroxine  50 mcg Oral Q0600  . metoprolol tartrate  12.5 mg Oral BID  . polyethylene glycol  17 g Oral Daily  . potassium chloride  40 mEq Oral Once  . predniSONE  5 mg Oral Daily  . rosuvastatin  20 mg Oral Daily  . sodium chloride flush  3 mL Intravenous Q12H  . tacrolimus  3 mg Oral BID  . warfarin  5 mg Oral q1800  . Warfarin - Pharmacist Dosing Inpatient   Does not apply q1800   Continuous Infusions: . sodium chloride     PRN Meds: sodium chloride, acetaminophen, calcium carbonate, ondansetron (ZOFRAN) IV, sodium chloride flush   Vital Signs    Vitals:   12/21/18 2051 12/21/18 2258 12/22/18 0530 12/22/18 0531  BP: (!) 158/62  110/62   Pulse: (!) 54 89 62   Resp: 20  14   Temp: 98.8 F (37.1 C)  99.7 F (37.6 C)   TempSrc: Oral  Oral   SpO2: 99%  93%   Weight:    79.8 kg  Height:        Intake/Output Summary (Last 24 hours) at 12/22/2018 0830 Last data filed at 12/22/2018 0801 Gross per 24 hour  Intake 1380 ml  Output 3400 ml  Net -2020 ml   Last 3 Weights 12/22/2018 12/21/2018 12/20/2018  Weight (lbs) 176 lb 180 lb 6.4 oz 182 lb  Weight (kg) 79.833 kg 81.829 kg 82.555 kg     Telemetry    NSR with occasional PACs currently - Personally Reviewed  Physical Exam   GEN: No acute distress.  HEENT: Normocephalic, atraumatic, sclera  non-icteric. Neck: No JVD or bruits. Cardiac: Regular with occasional ectopy. 2/6 SEM LSB (inadvertantly not documented yesterday) Radials/DP/PT 1+ and equal bilaterally.  Respiratory: Diffusely diminished bilaterally with faint crackles at bases. Minimal rare scattered wheeze. No rhonchi.  GI: Soft, nontender, non-distended, BS +x 4. MS: no deformity. Extremities: No clubbing or cyanosis. No LE edema today. Distal pedal pulses are 2+ and equal bilaterally. LUE mildly swollen compared to RUE but improved. Neuro:  AAOx3. Follows commands. Psych:  Responds to questions appropriately with a normal affect.  Labs    Chemistry Recent Labs  Lab 12/20/18 0442 12/21/18 0415 12/22/18 0433  NA 143 141 139  K 4.0 3.7 3.2*  CL 98 95* 90*  CO2 36* 36* 37*  GLUCOSE 234* 182* 144*  BUN 34* 41* 44*  CREATININE 1.95* 2.38* 2.34*  CALCIUM 9.7 10.0 9.9  GFRNONAA 25* 19* 20*  GFRAA 29* 23* 23*  ANIONGAP 9 10 12      Hematology Recent Labs  Lab 12/19/18 1621  WBC 7.4  RBC 5.30*  HGB 12.9  HCT 43.5  MCV 82.1  MCH 24.3*  MCHC 29.7*  RDW 19.9*  PLT 205    Cardiac Enzymes Recent  Labs  Lab 12/21/18 1021 12/21/18 1350  TROPONINI 0.10* 0.08*   No results for input(s): TROPIPOC in the last 168 hours.   BNP Recent Labs  Lab 12/19/18 1621  BNP 1,637.0*     DDimer No results for input(s): DDIMER in the last 168 hours.   Radiology    Vas [place] Upper Extremity Venous Duplex  Result Date: 12/20/2018 UPPER VENOUS STUDY  Indications: Swelling Performing Technologist: June Leap RDMS, RVT  Examination Guidelines: A complete evaluation includes B-mode imaging, spectral Doppler, color Doppler, and power Doppler as needed of all accessible portions of each vessel. Bilateral testing is considered an integral part of a complete examination. Limited examinations for reoccurring indications may be performed as noted.  Right Findings:  +----------+------------+---------+-----------+----------+-------+ RIGHT     CompressiblePhasicitySpontaneousPropertiesSummary +----------+------------+---------+-----------+----------+-------+ Subclavian               Yes       Yes                      +----------+------------+---------+-----------+----------+-------+  Left Findings: +----------+------------+---------+-----------+----------+---------------------+ LEFT      CompressiblePhasicitySpontaneousProperties       Summary        +----------+------------+---------+-----------+----------+---------------------+ IJV                      Yes       Yes               unable to compress                                                         due to increased                                                           venous pressure    +----------+------------+---------+-----------+----------+---------------------+ Subclavian               Yes       Yes               unable to compress                                                         due to increased                                                           venous pressure    +----------+------------+---------+-----------+----------+---------------------+ Axillary      Full       Yes       Yes                                    +----------+------------+---------+-----------+----------+---------------------+ Brachial  Full       Yes       Yes                                    +----------+------------+---------+-----------+----------+---------------------+ Radial        Full                                                        +----------+------------+---------+-----------+----------+---------------------+ Ulnar         Full                                                        +----------+------------+---------+-----------+----------+---------------------+ Cephalic      Full                                                         +----------+------------+---------+-----------+----------+---------------------+ Basilic       Full                                                        +----------+------------+---------+-----------+----------+---------------------+ HD access patent.  Summary:  Right: No evidence of thrombosis in the subclavian.  Left: No evidence of deep vein thrombosis in the upper extremity. No evidence of superficial vein thrombosis in the upper extremity.  *See table(s) above for measurements and observations.  Diagnosing physician: Curt Jews MD Electronically signed by Curt Jews MD on 12/20/2018 at 2:35:12 PM.    Final     Patient Profile     73 y.o. female with CAD s/p PCI of the RCAin 2003 (occluded by cath 03/2013, normal nuc 11/2017), atrial fibrillation on coumadin,severe AS s/p AVR with pericardial tissue valve and MAZE procedure in 2014, HTN, chronic diastolic CHF, HLD,PAD followed by Dr. 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 She was seen in office yesterday with volume overload, hypoxia (acute hypoxic respiratory failure), 25lb weight gain and felt to require IV diuresis. UE duplex neg for DVT. 2D echo 12/20/18 EF 60-6%, mildly enlarged RV with moderate pulm HTN (decreased PASP from prior), severe LAE, stable AV prosthesis, calcification of mitral valve. Concern noted for salt and fluid restriction adherence.  Assessment & Plan    1. Acute on chronic diastolic CHF with acute hypoxic respiratory failure requiring supplemental O2 - Of note, OP CT chest 12/04/18 was suggestive of pulm edema, ascites and body wall edema. Pulm pressures appear improved by f/u echo. Diuretics scaled back yesterday due to rising Cr which has plateaued. She is diuresing well on this regimen. Weight is down 8lb from admission. She was 159-162 in 09/2018 then jumped to 185 in 11/2018 so suspect still have some volume  to go. Would continue present  regimen. IM is supplementing potassium. Patient is also noted to have continue hypoxia by nursing notes and mounting low grade temp. Have spoken to Dr. Rockne Menghini to bring to her attention. Appreciate IM input on this issue. Patient may not mount full temp due to immunouppressant therapy.  2. ESRD s/p renal transplant with resultant CKD stage III - see above. Avoid nephrotoxic agents.  3. Paroxysmal atrial fibrillation - she is on chronic Coumadin. Pharmacy is managing anticoagulation. EKG challenging to interpret. Interpretation says atrial fib but in V1 there appear to be P waves for most QRS complexes, albeit at times with different morphology. Reviewed with EP APP. May represent NSR with PACs or WAP.  4. HTN - lisinopril/amlodipine on hold due to softer BP yesterday. She was slightly hypertensive yesterday but f/u BP today 110/62. Follow. If need be can add amlodipine back but would hold ACE with AKI.  5. H/o CAD s/p PCI - she is not on ASA presumably due to concomitant warfarin. She is on BB and statin. She has reported intermittent heartburn. Troponin only mildly elevated and more consistent with demand ischemia than ACS. Wonder if residual stab sensation due to body wall edema. Continue to monitor with treatment of the above.  6. S/p AVR - prosthesis OK by echo.   For questions or updates, please contact [place] Please consult [email or web address] for contact info under Cardiology/STEMI.  Signed, Charlie Pitter, PA-C 12/22/2018, 8:30 AM    History and all data above reviewed.  Patient examined.  I agree with the findings as above.  She is rather taciturn today.   The patient exam reveals COR:RRR  ,  Lungs: No crackles  ,  Abd: Positive bowel sounds, no rebound no guarding, Ext Moderate edema  .  All available labs, radiology testing, previous records reviewed. Agree with documented assessment and plan. Acute on chronic diastolic HF.  Agree with continued gentle  diuresis despite the increased creatinine.  Chest pain is atypical and at this point I am not planning any ischemia work up.     Jeneen Rinks Lizmary Nader  10:34 AM  12/22/2018

## 2018-12-22 NOTE — Care Management Important Message (Signed)
Important Message  Patient Details  Name: [name] MRN: [id number] Date of Birth: April 07, 1946   Medicare Important Message Given:  Yes    [name] 12/22/2018, 12:26 PM

## 2018-12-22 NOTE — Progress Notes (Signed)
ANTICOAGULATION CONSULT NOTE - Follow up Pharmacy Consult for warfarin Indication: atrial fibrillation and hx of bioprosthetic aortic valve replacement  Allergies  Allergen Reactions  . Ibuprofen Nausea And Vomiting  . Sulfamethoxazole-Trimethoprim Itching, Swelling and Rash    Swelling of the face  . Sulfonamide Derivatives Itching, Swelling and Rash    Swelling of the face  . Tape Rash    Paper tape is ok  . Tramadol Nausea And Vomiting  . Doxycycline Nausea Only  . Hydrocil [Psyllium] Nausea And Vomiting  . Bactrim Itching, Swelling and Rash  . Red Dye Itching and Rash    Patient Measurements: Height: 5\' 1"  (154.9 cm) Weight: 176 lb (79.8 kg) IBW/kg (Calculated) : 47.8  Vital Signs: Temp: 99.7 F (37.6 C) (06/05 0530) Temp Source: Oral (06/05 0530) BP: 110/62 (06/05 0530) Pulse Rate: 62 (06/05 0530)  Labs: Recent Labs    12/19/18 1621 12/20/18 0442 12/21/18 0415 12/21/18 1021 12/21/18 1350 12/22/18 0433  HGB 12.9  --   --   --   --   --   HCT 43.5  --   --   --   --   --   PLT 205  --   --   --   --   --   LABPROT 19.7* 20.4* 22.8*  --   --  23.3*  INR 1.7* 1.8* 2.1*  --   --  2.1*  CREATININE 1.89* 1.95* 2.38*  --   --  2.34*  TROPONINI  --   --   --  0.10* 0.08*  --     Estimated Creatinine Clearance: 20.2 mL/min (A) (by C-G formula based on SCr of 2.34 mg/dL (H)).   Medical History: Past Medical History:  Diagnosis Date  . Acute CHF (congestive heart failure) ([place]) 12/2018  . Adenocarcinoma of lung, stage 1, right ([place]) 11/25/2017  . Anemia, iron deficiency    of chronic disease  . Aortic stenosis    a. Severe AS by echo 11/2012.  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 Asystole ([place])    a. During ENT surgery 2005: developed marked asystole requiring CPR, felt due to vagal reaction (cath nonobst dz).  . Carotid artery disease ([place])    a. Carotid Dopplers performed in August 2013 showed 40-59% left stenosis and 0-39% right; f/u  recommended in 2 years.   . Cerebrovascular accident [facility]) 2009   a. LMCA infarct felt embolic [id number], maintained on chronic coumadin.; denies residual on 04/05/2013  . Cholelithiasis   . Chronic Persistent Atrial Fibrillation 12/31/2008   Qualifier: Diagnosis of  By: [name]    . Coronary artery disease 05/2002   a. Ant MI 2003 s/p PTCA/stent to RCA.   . Diverticulosis of colon   . Esophagitis, reflux   . ESRD (end stage renal disease) ([place])    a. Mass on L kidney per pt s/p nephrectomy - pt states not cancer - [facility] notes indicate ESRD due to HTN/DM - was previously on HD. b. Kidney transplant 02/2011.  Marland Kitchen GERD (gastroesophageal reflux disease)   . Gout   . Helicobacter pylori (H. pylori) infection    hx of  . Hemorrhoids   . Hx of colonic polyps    adenomatous  . Hyperlipidemia   . Hypertension   . Lung nodule seen on imaging study 04/07/2013   1.0 cm ground glass opacity RUL  . Myocardial infarction ([place]) 2003  . Pericardial effusion    a. Small  by echo 11/2011.  . S/P aortic valve replacement with bioprosthetic valve and maze procedure 04/12/2013   27mm [facility] Ease bovine pericardial tissue valve   . S/P Maze operation for atrial fibrillation 04/12/2013   Complete bilateral atrial lesion set using bipolar radiofrequency and cryothermy ablation with clipping of LA appendage  . Sleep apnea    Pt says testing was positive, intolerant of CPAP.  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  . Type II diabetes mellitus ([place])     Assessment: 73yo female admitted with CHF exacerbation. She is on warfarin PTA for atrial fibrillation and hx of bioprosethetic aortic valve replacement. INR on admission 6/2 was subtherapeutic at 1.6.  -INR= 2.1  Home warfarin dose: 5mg /day  Goal of Therapy:  INR 2-3 Monitor platelets by  anticoagulation protocol: Yes   Plan:  Warfarin 5mg /day Daily INR   Thank you for involving pharmacy in this patient's care.  Hildred Laser, PharmD Clinical Pharmacist **Pharmacist phone directory can now be found on [email or web address] ([email or web address]).  Listed under [place].

## 2018-12-22 NOTE — Progress Notes (Addendum)
Progress Note    [name]  YKZ:[id number] DOB: 1946-07-07  DOA: 12/19/2018 PCP: [name], MD    Brief Narrative:   Chief complaint: Follow-up shortness of breath secondary to decompensated CHF  Medical records reviewed and are as summarized below:  [name] is an 73 y.o. female with a PMH of CAD status post PCI to the RCA, atrial fibrillation on Coumadin, aortic valve stenosis status post AVR, and stage III CKD with history of renal transplantation 2012, IDDM, and chronic diastolic CHF who was admitted on 12/19/2018 for evaluation of progressive shortness of breath associated with worsening peripheral edema in the setting of reduction of her diuretic dose dating back to January.  At 20+ pound weight gain and despite increasing her torsemide to 100 mg twice daily for the past few weeks, she has not had any relief.  ED, she was noted to be hypoxic with a room air saturation of 86%.  Chest x-ray showed cardiomegaly with moderate vascular congestion and interstitial edema.  EKG showed sinus rhythm with T wave inversions that were not new.  Assessment/Plan:   Principal Problem:   Acute on chronic diastolic CHF (congestive heart failure) (HCC) with acute hypoxic respiratory failure Admitted and placed on high doses of diuretics given failure of outpatient treatment.  EKG showed sinus rhythm with PACs, Q waves in the anterior lead V1, and lateral T wave abnormalities concerning for ischemia.  Chest x-ray showed interstitial edema and cardiomegaly.  Current regimen is Lasix 80 mg 3 times daily after cardiology evaluated her and adjusted her diuretic therapy yesterday.  2D echo showed EF 60-65% with mild basal septal hypertrophy.  Left ventricular diastolic function could not be evaluated. I/O- 5.8 L total, 2.3 in last 24 hours.  Weight down about 3 kg overall.  Patient's creatinine is overall up, but stable after reducing Lasix (changed to 40 mg IV BID).  Dietician education  provided.  Noted to have a drop in her oxygen saturation to 70% on room air last evening and had to be placed back on oxygen. Has had low grade temperatures, and given immunosuppression, will check CXR to rule out underlying pneumonia. More lethargic today with new complaints of cough.  Addendum: CXR personally reviewed and is pictured below for comparison.  Appears stable with no evidence of pneumonia. Cardiomegaly present.     Active Problems:   Hypokalemia From diuresis.  Will replete.    Left upper extremity swelling Previous AV fistula in place.  Edema was worse than her baseline, but improved with diuresis.  Left upper extremity venous duplex ultrasound negative for DVT, superficial thrombosis.    Paroxysmal atrial Fibrillation/status post aortic valve replacement with bioprosthetic valve and maze procedure History of Maze procedure 2014.  Twelve-lead EKG personally reviewed and showed normal sinus rhythm.  On chronic anticoagulation with Coumadin, INR 2.1.  Continue rate control with metoprolol with stable heart rate noted.  Repeat 2D echocardiogram showed a stable AV bioprosthesis.    Renal transplant recipient/immunosuppression/CKD (chronic kidney disease), stage III [facility]) The patient is followed by Dr. Joelyn Oms of nephrology and Dr. Roxy Horseman at [facility]  Creatinine on admission 1.89 with baseline creatinine varying from 1.2-2.25.  Continue immunosuppressive therapy with prednisone, Prograf, and tacrolimus.  Monitor renal function carefully while on aggressive diuresis.  Because of her underlying comorbidities, it would be unsafe to attempt to diuresis in the outpatient setting.  Creatinine stable but up from admission values.    Diabetes mellitus due to underlying condition  with hyperglycemia, with long-term current use of insulin (HCC) and renal complications Most recent hemoglobin A1c was 8.2 in February.  Repeated and is currently 7.9.  Currently being managed with Lantus 15 units and  moderate scale SSI.  CBGs 132-193.   CAD status post PCI History of PCI to the RCA in 2014.  Continue statin and beta-blocker therapy.    Hypothyroidism Continue Synthroid.  TSH WNL.    Essential hypertension Continue lisinopril, metoprolol and amlodipine.  Blood pressure controlled.    Obesity Body mass index is 33.25 kg/m.     Constipation Continue MiraLAX 17 g daily.   Family Communication/Anticipated D/C date and plan/Code Status   DVT prophylaxis: Coumadin ordered. Code Status: Full Code.  Family Communication: Husband updated by telephone 12/21/18. Disposition Plan: Home when fully diuresed, and renal function stable likely 1-2 days.   Medical Consultants:    Cardiology   Anti-Infectives:    None  Subjective:   [name] is lethargic this morning and condition is different.  She reports cough and some SOB.    Objective:    Vitals:   12/21/18 2051 12/21/18 2258 12/22/18 0530 12/22/18 0531  BP: (!) 158/62  110/62   Pulse: (!) 54 89 62   Resp: 20  14   Temp: 98.8 F (37.1 C)  99.7 F (37.6 C)   TempSrc: Oral  Oral   SpO2: 99%  93%   Weight:    79.8 kg  Height:        Intake/Output Summary (Last 24 hours) at 12/22/2018 0810 Last data filed at 12/22/2018 0801 Gross per 24 hour  Intake 1620 ml  Output 3950 ml  Net -2330 ml   Filed Weights   12/20/18 0422 12/21/18 0349 12/22/18 0531  Weight: 82.6 kg 81.8 kg 79.8 kg    Exam: General: No acute distress. Cardiovascular: Heart sounds show a regular rate, and rhythm. No gallops or rubs. No murmurs. + JVD. Lungs: Scattered rales and wheezes, marked diminished breath sounds. Abdomen: Soft, nontender, nondistended with normal active bowel sounds. No masses. No hepatosplenomegaly. Neurological: Lethargic. Skin: Warm and dry. No rashes or lesions. Extremities: No clubbing or cyanosis. Trace edema. Pedal pulses 2+. Psychiatric: Mood and affect are flat. Insight and judgment are fair.   Data Reviewed:    I have personally reviewed following labs and imaging studies:  Labs: Labs show the following:   Basic Metabolic Panel: Recent Labs  Lab 12/19/18 1621 12/20/18 0442 12/21/18 0415 12/22/18 0433  NA 142 143 141 139  K 4.3 4.0 3.7 3.2*  CL 100 98 95* 90*  CO2 32 36* 36* 37*  GLUCOSE 223* 234* 182* 144*  BUN 31* 34* 41* 44*  CREATININE 1.89* 1.95* 2.38* 2.34*  CALCIUM 9.7 9.7 10.0 9.9  MG  --  1.8  --   --    GFR Estimated Creatinine Clearance: 20.2 mL/min (A) (by C-G formula based on SCr of 2.34 mg/dL (H)).  Coagulation profile Recent Labs  Lab 12/19/18 1443 12/19/18 1621 12/20/18 0442 12/21/18 0415 12/22/18 0433  INR 1.6* 1.7* 1.8* 2.1* 2.1*    CBC: Recent Labs  Lab 12/19/18 1621  WBC 7.4  NEUTROABS 6.0  HGB 12.9  HCT 43.5  MCV 82.1  PLT 205   CBG: Recent Labs  Lab 12/21/18 0626 12/21/18 1136 12/21/18 1616 12/21/18 2137 12/22/18 0613  GLUCAP 145* 132* 165* 193* 141*   Hgb A1c: Recent Labs    12/19/18 2131  HGBA1C 7.9*   Thyroid function studies:  Recent Labs    12/19/18 2131  TSH 1.645    Microbiology Recent Results (from the past 240 hour(s))  SARS Coronavirus 2 (CEPHEID - Performed in [place] hospital lab), Hosp Order     Status: None   Collection Time: 12/19/18  4:46 PM  Result Value Ref Range Status   SARS Coronavirus 2 NEGATIVE NEGATIVE Final    Comment: (NOTE) If result is NEGATIVE SARS-CoV-2 target nucleic acids are NOT DETECTED. The SARS-CoV-2 RNA is generally detectable in upper and lower  respiratory specimens during the acute phase of infection. The lowest  concentration of SARS-CoV-2 viral copies this assay can detect is 250  copies / mL. A negative result does not preclude SARS-CoV-2 infection  and should not be used as the sole basis for treatment or other  patient management decisions.  A negative result may occur with  improper specimen collection / handling, submission of specimen other  than nasopharyngeal  swab, presence of viral mutation(s) within the  areas targeted by this assay, and inadequate number of viral copies  (<250 copies / mL). A negative result must be combined with clinical  observations, patient history, and epidemiological information. If result is POSITIVE SARS-CoV-2 target nucleic acids are DETECTED. The SARS-CoV-2 RNA is generally detectable in upper and lower  respiratory specimens dur ing the acute phase of infection.  Positive  results are indicative of active infection with SARS-CoV-2.  Clinical  correlation with patient history and other diagnostic information is  necessary to determine patient infection status.  Positive results do  not rule out bacterial infection or co-infection with other viruses. If result is PRESUMPTIVE POSTIVE SARS-CoV-2 nucleic acids MAY BE PRESENT.   A presumptive positive result was obtained on the submitted specimen  and confirmed on repeat testing.  While 2019 novel coronavirus  (SARS-CoV-2) nucleic acids may be present in the submitted sample  additional confirmatory testing may be necessary for epidemiological  and / or clinical management purposes  to differentiate between  SARS-CoV-2 and other Sarbecovirus currently known to infect humans.  If clinically indicated additional testing with an alternate test  methodology [phone]) is advised. The SARS-CoV-2 RNA is generally  detectable in upper and lower respiratory sp ecimens during the acute  phase of infection. The expected result is Negative. Fact Sheet for Patients:  StrictlyIdeas.no Fact Sheet for Healthcare Providers: BankingDealers.co.za This test is not yet approved or cleared by the [place] FDA and has been authorized for detection and/or diagnosis of SARS-CoV-2 by FDA under an Emergency Use Authorization (EUA).  This EUA will remain in effect (meaning this test can be used) for the duration of the COVID-19 declaration  under Section 564(b)(1) of the Act, 21 U.S.C. section 360bbb-3(b)(1), unless the authorization is terminated or revoked sooner. Performed at [facility], [place] [address] [place], [place] [address]     Procedures and diagnostic studies:  Vas [place] Upper Extremity Venous Duplex  Result Date: 12/20/2018 UPPER VENOUS STUDY  Indications: Swelling Performing Technologist: June Leap RDMS, RVT  Examination Guidelines: A complete evaluation includes B-mode imaging, spectral Doppler, color Doppler, and power Doppler as needed of all accessible portions of each vessel. Bilateral testing is considered an integral part of a complete examination. Limited examinations for reoccurring indications may be performed as noted.  Right Findings: +----------+------------+---------+-----------+----------+-------+  RIGHT      Compressible Phasicity Spontaneous Properties Summary  +----------+------------+---------+-----------+----------+-------+  Subclavian  Yes        Yes                         +----------+------------+---------+-----------+----------+-------+  Left Findings: +----------+------------+---------+-----------+----------+---------------------+  LEFT       Compressible Phasicity Spontaneous Properties        Summary         +----------+------------+---------+-----------+----------+---------------------+  IJV                        Yes        Yes                 unable to compress                                                                due to increased                                                                  venous pressure     +----------+------------+---------+-----------+----------+---------------------+  Subclavian                 Yes        Yes                 unable to compress                                                                due to increased                                                                  venous pressure      +----------+------------+---------+-----------+----------+---------------------+  Axillary       Full        Yes        Yes                                       +----------+------------+---------+-----------+----------+---------------------+  Brachial       Full        Yes        Yes                                       +----------+------------+---------+-----------+----------+---------------------+  Radial         Full                                                             +----------+------------+---------+-----------+----------+---------------------+  Ulnar          Full                                                             +----------+------------+---------+-----------+----------+---------------------+  Cephalic       Full                                                             +----------+------------+---------+-----------+----------+---------------------+  Basilic        Full                                                             +----------+------------+---------+-----------+----------+---------------------+ HD access patent.  Summary:  Right: No evidence of thrombosis in the subclavian.  Left: No evidence of deep vein thrombosis in the upper extremity. No evidence of superficial vein thrombosis in the upper extremity.  *See table(s) above for measurements and observations.  Diagnosing physician: Curt Jews MD Electronically signed by Curt Jews MD on 12/20/2018 at 2:35:12 PM.    Final     Medications:    dapsone  25 mg Oral Daily   furosemide  40 mg Intravenous BID   gabapentin  100 mg Oral BID   insulin aspart  0-15 Units Subcutaneous TID WC   insulin aspart  0-5 Units Subcutaneous QHS   insulin glargine  15 Units Subcutaneous QHS   levothyroxine  50 mcg Oral Q0600   metoprolol tartrate  12.5 mg Oral BID   polyethylene glycol  17 g Oral Daily   potassium chloride  40 mEq Oral Once   predniSONE  5 mg Oral Daily   rosuvastatin  20 mg Oral Daily   sodium  chloride flush  3 mL Intravenous Q12H   tacrolimus  3 mg Oral BID   warfarin  5 mg Oral q1800   Warfarin - Pharmacist Dosing Inpatient   Does not apply q1800   Continuous Infusions:  sodium chloride       LOS: 3 days   Samin Milke  [facility]  *Please refer to [email or web address], password [email or web address] to get updated schedule on who will round on this patient, as hospitalists switch teams weekly. If 7PM-7AM, please contact night-coverage at [email or web address], password [email or web address] for any overnight needs.  12/22/2018, 8:10 AM

## 2018-12-23 DIAGNOSIS — I480 Paroxysmal atrial fibrillation: Secondary | ICD-10-CM

## 2018-12-23 DIAGNOSIS — E876 Hypokalemia: Secondary | ICD-10-CM

## 2018-12-23 DIAGNOSIS — N183 Chronic kidney disease, stage 3 (moderate): Secondary | ICD-10-CM

## 2018-12-23 LAB — GLUCOSE, CAPILLARY
Glucose-Capillary: 127 mg/dL — ABNORMAL HIGH (ref 70–99)
Glucose-Capillary: 178 mg/dL — ABNORMAL HIGH (ref 70–99)
Glucose-Capillary: 219 mg/dL — ABNORMAL HIGH (ref 70–99)
Glucose-Capillary: 379 mg/dL — ABNORMAL HIGH (ref 70–99)

## 2018-12-23 LAB — CBC
HCT: 41.3 % (ref 36.0–46.0)
Hemoglobin: 12.4 g/dL (ref 12.0–15.0)
MCH: 24.3 pg — ABNORMAL LOW (ref 26.0–34.0)
MCHC: 30 g/dL (ref 30.0–36.0)
MCV: 81 fL (ref 80.0–100.0)
Platelets: 210 10*3/uL (ref 150–400)
RBC: 5.1 MIL/uL (ref 3.87–5.11)
RDW: 18.6 % — ABNORMAL HIGH (ref 11.5–15.5)
WBC: 6 10*3/uL (ref 4.0–10.5)
nRBC: 0 % (ref 0.0–0.2)

## 2018-12-23 LAB — PROTIME-INR
INR: 2.1 — ABNORMAL HIGH (ref 0.8–1.2)
Prothrombin Time: 22.9 seconds — ABNORMAL HIGH (ref 11.4–15.2)

## 2018-12-23 LAB — BASIC METABOLIC PANEL
Anion gap: 10 (ref 5–15)
BUN: 51 mg/dL — ABNORMAL HIGH (ref 8–23)
CO2: 41 mmol/L — ABNORMAL HIGH (ref 22–32)
Calcium: 10 mg/dL (ref 8.9–10.3)
Chloride: 90 mmol/L — ABNORMAL LOW (ref 98–111)
Creatinine, Ser: 2.26 mg/dL — ABNORMAL HIGH (ref 0.44–1.00)
GFR calc Af Amer: 24 mL/min — ABNORMAL LOW (ref >60)
GFR calc non Af Amer: 21 mL/min — ABNORMAL LOW (ref >60)
Glucose, Bld: 144 mg/dL — ABNORMAL HIGH (ref 70–99)
Potassium: 2.9 mmol/L — ABNORMAL LOW (ref 3.5–5.1)
Sodium: 141 mmol/L (ref 135–145)

## 2018-12-23 LAB — BRAIN NATRIURETIC PEPTIDE: B Natriuretic Peptide: 1655.2 pg/mL — ABNORMAL HIGH (ref 0.0–100.0)

## 2018-12-23 MED ORDER — WARFARIN SODIUM 5 MG PO TABS
5.0000 mg | ORAL_TABLET | Freq: Once | ORAL | Status: AC
  Administered 2018-12-23: 5 mg via ORAL
  Filled 2018-12-23: qty 1

## 2018-12-23 NOTE — Plan of Care (Signed)
  Problem: Activity: Goal: Capacity to carry out activities will improve Outcome: Progressing   Problem: Activity: Goal: Risk for activity intolerance will decrease Outcome: Progressing   Problem: Elimination: Goal: Will not experience complications related to bowel motility Outcome: Progressing   Problem: Safety: Goal: Ability to remain free from injury will improve Outcome: Progressing   

## 2018-12-23 NOTE — Progress Notes (Signed)
Progress Note    [name]  HAL:[id number] DOB: Aug 27, 1945  DOA: 12/19/2018 PCP: [name], MD    Brief Narrative:   Chief complaint: Follow-up shortness of breath secondary to decompensated CHF  Medical records reviewed and are as summarized below:  [name] is an 73 y.o. female with a PMH of CAD status post PCI to the RCA, atrial fibrillation on Coumadin, aortic valve stenosis status post AVR, and stage III CKD with history of renal transplantation 2012, IDDM, and chronic diastolic CHF who was admitted on 12/19/2018 for evaluation of progressive shortness of breath associated with worsening peripheral edema in the setting of reduction of her diuretic dose dating back to January.  At 20+ pound weight gain and despite increasing her torsemide to 100 mg twice daily for the past few weeks, she has not had any relief.  ED, she was noted to be hypoxic with a room air saturation of 86%.  Chest x-ray showed cardiomegaly with moderate vascular congestion and interstitial edema.  EKG showed sinus rhythm with T wave inversions that were not new.  Assessment/Plan:   Principal Problem:   Acute on chronic diastolic CHF (congestive heart failure) (HCC) with acute hypoxic respiratory failure -Still with fluid overload on exam -Continue low-sodium diet, daily weights and strict intake and output -Patient reporting good urine output -Appreciate cardiology service assistance and recommendations; patient weight is still not at baseline (she is currently 12 pounds above her baseline). -Continue IV diuresis -No infiltrates appreciated on exam -Continue oxygen supplementation with weaning of intention as tolerated.    Hypokalemia -From diuresis.   -Will replete and follow electrolytes trend.    Left upper extremity swelling -Previous AV fistula in place.  Edema was worse than her baseline, but improving with diuresis.   -Left upper extremity venous duplex ultrasound negative for DVT  or superficial thrombosis. -Continue monitoring    Paroxysmal atrial Fibrillation/status post aortic valve replacement with bioprosthetic valve and maze procedure -EKG and telemetry has been reviewed; patient in sinus rhythm at this time -Continue Coumadin -INR therapeutic. -Continue metoprolol for rate control.      Renal transplant recipient/immunosuppression/CKD (chronic kidney disease), stage III ([place]) -The patient is followed by Dr. Joelyn Oms of nephrology and Dr. Roxy Horseman at [facility].  Creatinine on admission 1.89 with baseline creatinine varying from 1.2-2.25.   -Continue immunosuppressive therapy with prednisone, Prograf, and tacrolimus.   -Follow renal function closely while receiving aggressive diuresis. -Creatinine currently 2.26    Diabetes mellitus due to underlying condition with hyperglycemia, with long-term current use of insulin (HCC) and renal complications -Most recent hemoglobin A1c was 8.2 in February.  R -Repeated A1c during this admission 7.9. -Continue Lantus and sliding scale insulin -Follow CBGs and further adjust hypoglycemic regimen as needed.     CAD status post PCI -Patient denies chest pain -History of PCI to the RCA in 2014.   -Will continue the use of statins and beta-blocker therapy.      Hypothyroidism Continue Synthroid.  TSH WNL.    Essential hypertension -Overall stable -Continue lisinopril, metoprolol and amlodipine.      Obesity Body mass index is 32.67 kg/m.  -Low calorie diet, portion control and increase physical activity discussed with patient.    Constipation -Reports having good bowel movements -Continue MiraLAX 17 g daily.   Family Communication/Anticipated D/C date and plan/Code Status   DVT prophylaxis: Coumadin ordered. Code Status: Full Code.  Family Communication: No family bedside Disposition Plan: Home when fully diuresed,  and renal function stable; most likely another 24-48 hours of IV diuresis needed.   Medical  Consultants:    Cardiology   Anti-Infectives:    None  Subjective:  Afebrile, no nausea, no vomiting, no chest pain.  Still reporting shortness of breath on exertion and positive orthopnea.  On physical exam there is still lower extremity swelling and her weight is still not back to baseline  Objective:    Vitals:   12/22/18 2040 12/23/18 0611 12/23/18 0800 12/23/18 1231  BP: (!) 155/75 (!) 163/79 130/60 108/60  Pulse: (!) 101 (!) 51 60 (!) 53  Resp: 18 18  17   Temp: 99 F (37.2 C) 98.8 F (37.1 C)  98.6 F (37 C)  TempSrc: Oral Oral  Oral  SpO2: 93% 95% 100% 97%  Weight:  78.4 kg    Height:        Intake/Output Summary (Last 24 hours) at 12/23/2018 1610 Last data filed at 12/23/2018 0936 Gross per 24 hour  Intake 875 ml  Output 2250 ml  Net -1375 ml   Filed Weights   12/21/18 0349 12/22/18 0531 12/23/18 0611  Weight: 81.8 kg 79.8 kg 78.4 kg    Exam: General exam: Alert, awake, oriented x 3; in no major distress.  Denies chest pain.  Reports good urine output.  Still experiencing some shortness of breath on exertion and having orthopnea. Respiratory system: Fine crackles at the bases, no using accessory muscles, no wheezing. Cardiovascular system:RRR. No murmurs, rubs, gallops.  Mild JVD on examination. Gastrointestinal system: Abdomen is nondistended, soft and nontender. No organomegaly or masses felt. Normal bowel sounds heard. Central nervous system: Alert and oriented. No focal neurological deficits. Extremities: TED hose in place bilaterally; 1+ edema on her legs appreciated on exam; some tenderness on palpation reported.  No cyanosis or clubbing. Skin: No rashes, lesions or ulcers Psychiatry: Judgement and insight appear normal. Mood & affect appropriate.    Data Reviewed:   I have personally reviewed following labs and imaging studies:  Labs: Labs show the following:   Basic Metabolic Panel: Recent Labs  Lab 12/19/18 1621 12/20/18 0442 12/21/18  0415 12/22/18 0433 12/23/18 0444  NA 142 143 141 139 141  K 4.3 4.0 3.7 3.2* 2.9*  CL 100 98 95* 90* 90*  CO2 32 36* 36* 37* 41*  GLUCOSE 223* 234* 182* 144* 144*  BUN 31* 34* 41* 44* 51*  CREATININE 1.89* 1.95* 2.38* 2.34* 2.26*  CALCIUM 9.7 9.7 10.0 9.9 10.0  MG  --  1.8  --   --   --    GFR Estimated Creatinine Clearance: 20.7 mL/min (A) (by C-G formula based on SCr of 2.26 mg/dL (H)).  Coagulation profile Recent Labs  Lab 12/19/18 1621 12/20/18 0442 12/21/18 0415 12/22/18 0433 12/23/18 0444  INR 1.7* 1.8* 2.1* 2.1* 2.1*    CBC: Recent Labs  Lab 12/19/18 1621 12/23/18 0444  WBC 7.4 6.0  NEUTROABS 6.0  --   HGB 12.9 12.4  HCT 43.5 41.3  MCV 82.1 81.0  PLT 205 210   CBG: Recent Labs  Lab 12/22/18 1131 12/22/18 1618 12/22/18 2135 12/23/18 0607 12/23/18 1227  GLUCAP 131* 202* 224* 127* 178*   Microbiology Recent Results (from the past 240 hour(s))  SARS Coronavirus 2 (CEPHEID - Performed in [place] hospital lab), Hosp Order     Status: None   Collection Time: 12/19/18  4:46 PM  Result Value Ref Range Status   SARS Coronavirus 2 NEGATIVE NEGATIVE Final  Comment: (NOTE) If result is NEGATIVE SARS-CoV-2 target nucleic acids are NOT DETECTED. The SARS-CoV-2 RNA is generally detectable in upper and lower  respiratory specimens during the acute phase of infection. The lowest  concentration of SARS-CoV-2 viral copies this assay can detect is 250  copies / mL. A negative result does not preclude SARS-CoV-2 infection  and should not be used as the sole basis for treatment or other  patient management decisions.  A negative result may occur with  improper specimen collection / handling, submission of specimen other  than nasopharyngeal swab, presence of viral mutation(s) within the  areas targeted by this assay, and inadequate number of viral copies  (<250 copies / mL). A negative result must be combined with clinical  observations, patient history, and  epidemiological information. If result is POSITIVE SARS-CoV-2 target nucleic acids are DETECTED. The SARS-CoV-2 RNA is generally detectable in upper and lower  respiratory specimens dur ing the acute phase of infection.  Positive  results are indicative of active infection with SARS-CoV-2.  Clinical  correlation with patient history and other diagnostic information is  necessary to determine patient infection status.  Positive results do  not rule out bacterial infection or co-infection with other viruses. If result is PRESUMPTIVE POSTIVE SARS-CoV-2 nucleic acids MAY BE PRESENT.   A presumptive positive result was obtained on the submitted specimen  and confirmed on repeat testing.  While 2019 novel coronavirus  (SARS-CoV-2) nucleic acids may be present in the submitted sample  additional confirmatory testing may be necessary for epidemiological  and / or clinical management purposes  to differentiate between  SARS-CoV-2 and other Sarbecovirus currently known to infect humans.  If clinically indicated additional testing with an alternate test  methodology [phone]) is advised. The SARS-CoV-2 RNA is generally  detectable in upper and lower respiratory sp ecimens during the acute  phase of infection. The expected result is Negative. Fact Sheet for Patients:  StrictlyIdeas.no Fact Sheet for Healthcare Providers: BankingDealers.co.za This test is not yet approved or cleared by the [place] FDA and has been authorized for detection and/or diagnosis of SARS-CoV-2 by FDA under an Emergency Use Authorization (EUA).  This EUA will remain in effect (meaning this test can be used) for the duration of the COVID-19 declaration under Section 564(b)(1) of the Act, 21 U.S.C. section 360bbb-3(b)(1), unless the authorization is terminated or revoked sooner. Performed at [facility], [place] [address] [place], [place] [address]      Procedures and diagnostic studies:  Dg Chest 2 View  Result Date: 12/22/2018 CLINICAL DATA:  73 year old female with hypoxia, low-grade fever, on immunosuppressive therapy for lung cancer. EXAM: CHEST - 2 VIEW COMPARISON:  12/19/2018 and earlier. FINDINGS: AP and lateral views. Stable cardiomegaly and mediastinal contours. Prior median sternotomy and cardiac valve replacement. Visualized tracheal air column is within normal limits. Stable lung volumes. Confluent right apically lung opacity redemonstrated. Mild bilateral pulmonary interstitial markings are stable with no pneumothorax, pleural effusion, or acute pulmonary opacity. Abdominal Calcified aortic atherosclerosis. Stable cholecystectomy clips. Negative visible bowel gas pattern. No acute osseous abnormality identified. IMPRESSION: Stable chest, no acute findings. Electronically Signed   By: Genevie Ann M.D.   On: 12/22/2018 10:10    Medications:   . dapsone  25 mg Oral Daily  . furosemide  40 mg Intravenous BID  . gabapentin  100 mg Oral BID  . insulin aspart  0-15 Units Subcutaneous TID WC  . insulin aspart  0-5 Units Subcutaneous QHS  .  insulin glargine  15 Units Subcutaneous QHS  . levothyroxine  50 mcg Oral Q0600  . metoprolol tartrate  12.5 mg Oral BID  . polyethylene glycol  17 g Oral Daily  . predniSONE  5 mg Oral Daily  . rosuvastatin  20 mg Oral Daily  . sodium chloride flush  3 mL Intravenous Q12H  . tacrolimus  3 mg Oral BID  . warfarin  5 mg Oral ONCE-1800  . Warfarin - Pharmacist Dosing Inpatient   Does not apply q1800   Continuous Infusions: . sodium chloride       LOS: 4 days   Barton Dubois MD [phone]  [facility]   12/23/2018, 4:10 PM

## 2018-12-23 NOTE — Progress Notes (Signed)
Progress Note  Patient Name: [name] Date of Encounter: 12/23/2018  Primary Cardiologist: [name], MD  Subjective   States that she feels better today.  Less short of breath, leg swelling is improved overall.  No cough or chest pain.  Inpatient Medications    Scheduled Meds:  dapsone  25 mg Oral Daily   furosemide  40 mg Intravenous BID   gabapentin  100 mg Oral BID   insulin aspart  0-15 Units Subcutaneous TID WC   insulin aspart  0-5 Units Subcutaneous QHS   insulin glargine  15 Units Subcutaneous QHS   levothyroxine  50 mcg Oral Q0600   metoprolol tartrate  12.5 mg Oral BID   polyethylene glycol  17 g Oral Daily   predniSONE  5 mg Oral Daily   rosuvastatin  20 mg Oral Daily   sodium chloride flush  3 mL Intravenous Q12H   tacrolimus  3 mg Oral BID   warfarin  5 mg Oral ONCE-1800   Warfarin - Pharmacist Dosing Inpatient   Does not apply q1800   Continuous Infusions:  sodium chloride     PRN Meds: sodium chloride, acetaminophen, calcium carbonate, ondansetron (ZOFRAN) IV, sodium chloride flush   Vital Signs    Vitals:   12/22/18 1131 12/22/18 2040 12/23/18 0611 12/23/18 0800  BP: (!) 143/74 (!) 155/75 (!) 163/79 130/60  Pulse: (!) 46 (!) 101 (!) 51 60  Resp: 18 18 18    Temp: 99.6 F (37.6 C) 99 F (37.2 C) 98.8 F (37.1 C)   TempSrc: Oral Oral Oral   SpO2: 95% 93% 95% 100%  Weight:   78.4 kg   Height:        Intake/Output Summary (Last 24 hours) at 12/23/2018 1126 Last data filed at 12/23/2018 0936 Gross per 24 hour  Intake 1097 ml  Output 2850 ml  Net -1753 ml   Filed Weights   12/21/18 0349 12/22/18 0531 12/23/18 0611  Weight: 81.8 kg 79.8 kg 78.4 kg    Telemetry    Probable sinus rhythm with PACs.  Personally reviewed.  ECG    Tracing from 12/21/2018 shows sinus rhythm with PACs and PVCs, diffuse nonspecific ST-T changes.  Personally reviewed.  Physical Exam   GEN: No acute distress.   Neck: No JVD. Cardiac:   Largely regular, no gallop.  Respiratory: Nonlabored. Clear to auscultation bilaterally. GI: Soft, nontender, bowel sounds present. MS:  1-2+ lower leg edema; No deformity. Neuro:  Nonfocal. Psych: Alert and oriented x 3. Normal affect.  Labs    Chemistry Recent Labs  Lab 12/21/18 0415 12/22/18 0433 12/23/18 0444  NA 141 139 141  K 3.7 3.2* 2.9*  CL 95* 90* 90*  CO2 36* 37* 41*  GLUCOSE 182* 144* 144*  BUN 41* 44* 51*  CREATININE 2.38* 2.34* 2.26*  CALCIUM 10.0 9.9 10.0  GFRNONAA 19* 20* 21*  GFRAA 23* 23* 24*  ANIONGAP 10 12 10      Hematology Recent Labs  Lab 12/19/18 1621 12/23/18 0444  WBC 7.4 6.0  RBC 5.30* 5.10  HGB 12.9 12.4  HCT 43.5 41.3  MCV 82.1 81.0  MCH 24.3* 24.3*  MCHC 29.7* 30.0  RDW 19.9* 18.6*  PLT 205 210    Cardiac Enzymes Recent Labs  Lab 12/21/18 1021 12/21/18 1350  TROPONINI 0.10* 0.08*   No results for input(s): TROPIPOC in the last 168 hours.   BNP Recent Labs  Lab 12/19/18 1621 12/23/18 0444  BNP 1,637.0* 1,655.2*  Radiology    Dg Chest 2 View  Result Date: 12/22/2018 CLINICAL DATA:  73 year old female with hypoxia, low-grade fever, on immunosuppressive therapy for lung cancer. EXAM: CHEST - 2 VIEW COMPARISON:  12/19/2018 and earlier. FINDINGS: AP and lateral views. Stable cardiomegaly and mediastinal contours. Prior median sternotomy and cardiac valve replacement. Visualized tracheal air column is within normal limits. Stable lung volumes. Confluent right apically lung opacity redemonstrated. Mild bilateral pulmonary interstitial markings are stable with no pneumothorax, pleural effusion, or acute pulmonary opacity. Abdominal Calcified aortic atherosclerosis. Stable cholecystectomy clips. Negative visible bowel gas pattern. No acute osseous abnormality identified. IMPRESSION: Stable chest, no acute findings. Electronically Signed   By: Genevie Ann M.D.   On: 12/22/2018 10:10    Cardiac Studies   Echocardiogram 12/20/2018:  1.  The left ventricle has normal systolic function with an ejection fraction of 60-65%. The cavity size was mildly dilated. Mild basal septal hyperteophy. Left ventricular diastolic function could not be evaluated due to nondiagnostic images. No  evidence of left ventricular regional wall motion abnormalities.  2. The right ventricle has normal systolic function. The cavity was mildly enlarged. There is no increase in right ventricular wall thickness. Right ventricular systolic pressure is moderately elevated with an estimated pressure of 44.1 mmHg.  3. Left atrial size was severely dilated.  4. Right atrial size was moderately dilated.  5. Stable AV bioprosthesis that appears to be functioning normally. The mean AV gradient is 55mmHg which is down from 56mmHg a year ago. There is no perivalvular AI.  6. The mitral valve is degenerative. Mild thickening of the anterior mitral valve leaflet. Mild calcification of the anterior mitral valve leaflet. There is moderate to severe mitral annular calcification present.  7. The inferior vena cava was dilated in size with <50% respiratory variability.  8. Compared to last echo the PASP has decreased from 86mmHg to 82mmHg.  Patient Profile     73 y.o. female withCAD s/p PCI of the RCAin 2003(occluded by cath 03/2013, normal nuc 11/2017), atrial fibrillation on coumadin,severe AS s/p AVR with pericardial tissue valve and MAZE procedure in 2014, HTN, chronic diastolic CHF, HLD,PAD followed by Dr. 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 She was seen in office yesterday with volume overload, hypoxia (acute hypoxic respiratory failure), 25lb weight gain and felt to require IV diuresis. UE duplex neg for DVT. 2D echo 12/20/18 EF 60-6%, mildly enlarged RV with moderate pulm HTN (decreased PASP from prior), severe LAE, stable AV prosthesis, calcification of mitral valve. Concern  noted for salt and fluid restriction adherence.  Assessment & Plan    1.  Acute on chronic diastolic heart failure.  She is diuresing on IV Lasix with relatively stable degree of renal dysfunction and her weight has come down about 12 pounds.  Prior baseline weight was in the low 160s.  2.  ESRD status post renal transplant with CKD stage III at baseline.  Current creatinine stable at 2.2.  3.  Paroxysmal atrial fibrillation, currently in sinus rhythm with PACs.  Continue Coumadin with dosing by pharmacy, current INR is therapeutic.  4.  Essential hypertension.  She has been taken off amlodipine and Norvasc with relatively low blood pressures.  Trending up somewhat, may need to add back low-dose Norvasc eventually.  5.  History of bioprosthetic aortic valve replacement, stable by recent follow-up echocardiogram with mean gradient 20 mmHg.  6.  CAD with prior PCI  of the RCA in 2003.  No active angina symptoms.  Minor troponin elevation and relatively flat pattern is not consistent with ACS, more likely demand ischemia.  Patient not at prior baseline in terms of fluid status, although gradually improving.  She had approximately 2000 cc out more than last 24 hours.  Would continue Lasix at 40 mg IV twice daily.  Stay off lisinopril.  Continue Lopressor, Crestor, and Coumadin.  Depending on blood pressure trend, may ultimately be able to add back low-dose Norvasc.  Signed, Rozann Lesches, MD  12/23/2018, 11:26 AM

## 2018-12-23 NOTE — Progress Notes (Signed)
ANTICOAGULATION CONSULT NOTE - Follow up  Pharmacy Consult for warfarin Indication: atrial fibrillation and hx of bioprosthetic aortic valve replacement  Allergies  Allergen Reactions  . Ibuprofen Nausea And Vomiting  . Sulfamethoxazole-Trimethoprim Itching, Swelling and Rash    Swelling of the face  . Sulfonamide Derivatives Itching, Swelling and Rash    Swelling of the face  . Tape Rash    Paper tape is ok  . Tramadol Nausea And Vomiting  . Doxycycline Nausea Only  . Hydrocil [Psyllium] Nausea And Vomiting  . Bactrim Itching, Swelling and Rash  . Red Dye Itching and Rash    Patient Measurements: Height: 5\' 1"  (154.9 cm) Weight: 172 lb 14.4 oz (78.4 kg) IBW/kg (Calculated) : 47.8  Vital Signs: Temp: 98.8 F (37.1 C) (06/06 0611) Temp Source: Oral (06/06 0611) BP: 130/60 (06/06 0800) Pulse Rate: 60 (06/06 0800)  Labs: Recent Labs    12/21/18 0415 12/21/18 1021 12/21/18 1350 12/22/18 0433 12/23/18 0444  HGB  --   --   --   --  12.4  HCT  --   --   --   --  41.3  PLT  --   --   --   --  210  LABPROT 22.8*  --   --  23.3* 22.9*  INR 2.1*  --   --  2.1* 2.1*  CREATININE 2.38*  --   --  2.34* 2.26*  TROPONINI  --  0.10* 0.08*  --   --     Estimated Creatinine Clearance: 20.7 mL/min (A) (by C-G formula based on SCr of 2.26 mg/dL (H)).   Medical History: Past Medical History:  Diagnosis Date  . Acute CHF (congestive heart failure) ([place]) 12/2018  . Adenocarcinoma of lung, stage 1, right ([place]) 11/25/2017  . Anemia, iron deficiency    of chronic disease  . Aortic stenosis    a. Severe AS by echo 11/2012.  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 Asystole ([place])    a. During ENT surgery 2005: developed marked asystole requiring CPR, felt due to vagal reaction (cath nonobst dz).  . Carotid artery disease ([place])    a. Carotid Dopplers performed in August 2013 showed 40-59% left stenosis and 0-39% right; f/u recommended in 2 years.   .  Cerebrovascular accident [facility]) 2009   a. LMCA infarct felt embolic 8295, maintained on chronic coumadin.; denies residual on 04/05/2013  . Cholelithiasis   . Chronic Persistent Atrial Fibrillation 12/31/2008   Qualifier: Diagnosis of  By: [name]    . Coronary artery disease 05/2002   a. Ant MI 2003 s/p PTCA/stent to RCA.   . Diverticulosis of colon   . Esophagitis, reflux   . ESRD (end stage renal disease) ([place])    a. Mass on L kidney per pt s/p nephrectomy - pt states not cancer - [facility] notes indicate ESRD due to HTN/DM - was previously on HD. b. Kidney transplant 02/2011.  Marland Kitchen GERD (gastroesophageal reflux disease)   . Gout   . Helicobacter pylori (H. pylori) infection    hx of  . Hemorrhoids   . Hx of colonic polyps    adenomatous  . Hyperlipidemia   . Hypertension   . Lung nodule seen on imaging study 04/07/2013   1.0 cm ground glass opacity RUL  . Myocardial infarction ([place]) 2003  . Pericardial effusion    a. Small by echo 11/2011.  . S/P aortic valve replacement with bioprosthetic valve and maze  procedure 04/12/2013   77mm Edwards Magna Ease bovine pericardial tissue valve   . S/P Maze operation for atrial fibrillation 04/12/2013   Complete bilateral atrial lesion set using bipolar radiofrequency and cryothermy ablation with clipping of LA appendage  . Sleep apnea    Pt says testing was positive, intolerant of CPAP.  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  . Type II diabetes mellitus ([place])     Assessment: 73yo female admitted with CHF exacerbation. She is on warfarin PTA for atrial fibrillation and hx of bioprosethetic aortic valve replacement. INR on admission 6/2 was subtherapeutic at 1.6. Home warfarin dose: 5mg /day  INR today remains therapeutic at 2.1  Goal of Therapy:  INR 2-3 Monitor platelets by  anticoagulation protocol: Yes   Plan:  Warfarin 5mg  PO x1 tonight Daily INR   Thank you for involving pharmacy in this patient's care.  Jackson Latino, PharmD PGY1 Pharmacy Resident Phone [phone] 12/23/2018     10:03 AM

## 2018-12-24 LAB — MAGNESIUM: Magnesium: 1.9 mg/dL (ref 1.7–2.4)

## 2018-12-24 LAB — BASIC METABOLIC PANEL
Anion gap: 13 (ref 5–15)
BUN: 51 mg/dL — ABNORMAL HIGH (ref 8–23)
CO2: 40 mmol/L — ABNORMAL HIGH (ref 22–32)
Calcium: 9.9 mg/dL (ref 8.9–10.3)
Chloride: 88 mmol/L — ABNORMAL LOW (ref 98–111)
Creatinine, Ser: 2.14 mg/dL — ABNORMAL HIGH (ref 0.44–1.00)
GFR calc Af Amer: 26 mL/min — ABNORMAL LOW (ref >60)
GFR calc non Af Amer: 22 mL/min — ABNORMAL LOW (ref >60)
Glucose, Bld: 130 mg/dL — ABNORMAL HIGH (ref 70–99)
Potassium: 2.9 mmol/L — ABNORMAL LOW (ref 3.5–5.1)
Sodium: 141 mmol/L (ref 135–145)

## 2018-12-24 LAB — PROTIME-INR
INR: 2.4 — ABNORMAL HIGH (ref 0.8–1.2)
Prothrombin Time: 25.9 seconds — ABNORMAL HIGH (ref 11.4–15.2)

## 2018-12-24 LAB — GLUCOSE, CAPILLARY
Glucose-Capillary: 103 mg/dL — ABNORMAL HIGH (ref 70–99)
Glucose-Capillary: 252 mg/dL — ABNORMAL HIGH (ref 70–99)
Glucose-Capillary: 217 mg/dL — ABNORMAL HIGH (ref 70–99)
Glucose-Capillary: 245 mg/dL — ABNORMAL HIGH (ref 70–99)

## 2018-12-24 MED ORDER — WARFARIN SODIUM 5 MG PO TABS
5.0000 mg | ORAL_TABLET | Freq: Once | ORAL | Status: AC
  Administered 2018-12-24: 5 mg via ORAL
  Filled 2018-12-24: qty 1

## 2018-12-24 MED ORDER — POTASSIUM CHLORIDE CRYS ER 20 MEQ PO TBCR
40.0000 meq | EXTENDED_RELEASE_TABLET | ORAL | Status: AC
  Administered 2018-12-24 (×2): 40 meq via ORAL
  Filled 2018-12-24 (×2): qty 2

## 2018-12-24 NOTE — Progress Notes (Signed)
Progress Note  Patient Name: [name] Date of Encounter: 12/24/2018  Primary Cardiologist: [name], MD  Subjective   Feeling better in terms of both breathing status and leg swelling.  Stable appetite.  No chest pain.  Inpatient Medications    Scheduled Meds: . dapsone  25 mg Oral Daily  . furosemide  40 mg Intravenous BID  . gabapentin  100 mg Oral BID  . insulin aspart  0-15 Units Subcutaneous TID WC  . insulin aspart  0-5 Units Subcutaneous QHS  . insulin glargine  15 Units Subcutaneous QHS  . levothyroxine  50 mcg Oral Q0600  . metoprolol tartrate  12.5 mg Oral BID  . polyethylene glycol  17 g Oral Daily  . potassium chloride  40 mEq Oral Q4H  . predniSONE  5 mg Oral Daily  . rosuvastatin  20 mg Oral Daily  . sodium chloride flush  3 mL Intravenous Q12H  . tacrolimus  3 mg Oral BID  . Warfarin - Pharmacist Dosing Inpatient   Does not apply q1800   Continuous Infusions: . sodium chloride     PRN Meds: sodium chloride, acetaminophen, calcium carbonate, ondansetron (ZOFRAN) IV, sodium chloride flush   Vital Signs    Vitals:   12/23/18 1752 12/23/18 2206 12/24/18 0458 12/24/18 0843  BP: 139/69 134/71 (!) 132/50 (!) 123/56  Pulse: 76 69 (!) 42 60  Resp: 18 20 17    Temp:  98.3 F (36.8 C) 98.5 F (36.9 C)   TempSrc:  Oral Oral   SpO2:  97% 90%   Weight:   77.5 kg   Height:        Intake/Output Summary (Last 24 hours) at 12/24/2018 0910 Last data filed at 12/24/2018 0857 Gross per 24 hour  Intake 720 ml  Output 2500 ml  Net -1780 ml   Filed Weights   12/22/18 0531 12/23/18 0611 12/24/18 0458  Weight: 79.8 kg 78.4 kg 77.5 kg    Telemetry    Sinus rhythm with PACs.  Personally reviewed.  ECG    Tracing from 12/21/2018 shows sinus rhythm with PACs and PVCs, diffuse nonspecific ST-T changes.  Personally reviewed.  Physical Exam   GEN: No acute distress.   Neck: No JVD. Cardiac: RRR with ectopy, no gallop.  Respiratory: Nonlabored. Clear to  auscultation bilaterally. GI: Soft, nontender, bowel sounds present. MS:  1-2+ lower leg edema; No deformity. Neuro:  Nonfocal. Psych: Alert and oriented x 3. Normal affect.  Labs    Chemistry Recent Labs  Lab 12/22/18 0433 12/23/18 0444 12/24/18 0525  NA 139 141 141  K 3.2* 2.9* 2.9*  CL 90* 90* 88*  CO2 37* 41* 40*  GLUCOSE 144* 144* 130*  BUN 44* 51* 51*  CREATININE 2.34* 2.26* 2.14*  CALCIUM 9.9 10.0 9.9  GFRNONAA 20* 21* 22*  GFRAA 23* 24* 26*  ANIONGAP 12 10 13      Hematology Recent Labs  Lab 12/19/18 1621 12/23/18 0444  WBC 7.4 6.0  RBC 5.30* 5.10  HGB 12.9 12.4  HCT 43.5 41.3  MCV 82.1 81.0  MCH 24.3* 24.3*  MCHC 29.7* 30.0  RDW 19.9* 18.6*  PLT 205 210    Cardiac Enzymes Recent Labs  Lab 12/21/18 1021 12/21/18 1350  TROPONINI 0.10* 0.08*   No results for input(s): TROPIPOC in the last 168 hours.   BNP Recent Labs  Lab 12/19/18 1621 12/23/18 0444  BNP 1,637.0* 7,412.8*     Radiology    Dg Chest 2 View  Result Date: 12/22/2018 CLINICAL DATA:  73 year old female with hypoxia, low-grade fever, on immunosuppressive therapy for lung cancer. EXAM: CHEST - 2 VIEW COMPARISON:  12/19/2018 and earlier. FINDINGS: AP and lateral views. Stable cardiomegaly and mediastinal contours. Prior median sternotomy and cardiac valve replacement. Visualized tracheal air column is within normal limits. Stable lung volumes. Confluent right apically lung opacity redemonstrated. Mild bilateral pulmonary interstitial markings are stable with no pneumothorax, pleural effusion, or acute pulmonary opacity. Abdominal Calcified aortic atherosclerosis. Stable cholecystectomy clips. Negative visible bowel gas pattern. No acute osseous abnormality identified. IMPRESSION: Stable chest, no acute findings. Electronically Signed   By: Genevie Ann M.D.   On: 12/22/2018 10:10    Cardiac Studies   Echocardiogram 12/20/2018: 1. The left ventricle has normal systolic function with an  ejection fraction of 60-65%. The cavity size was mildly dilated. Mild basal septal hyperteophy. Left ventricular diastolic function could not be evaluated due to nondiagnostic images. No  evidence of left ventricular regional wall motion abnormalities. 2. The right ventricle has normal systolic function. The cavity was mildly enlarged. There is no increase in right ventricular wall thickness. Right ventricular systolic pressure is moderately elevated with an estimated pressure of 44.1 mmHg. 3. Left atrial size was severely dilated. 4. Right atrial size was moderately dilated. 5. Stable AV bioprosthesis that appears to be functioning normally. The mean AV gradient is 55mmHg which is down from 77mmHg a year ago. There is no perivalvular AI. 6. The mitral valve is degenerative. Mild thickening of the anterior mitral valve leaflet. Mild calcification of the anterior mitral valve leaflet. There is moderate to severe mitral annular calcification present. 7. The inferior vena cava was dilated in size with <50% respiratory variability. 8. Compared to last echo the PASP has decreased from 65mmHg to 28mmHg.  Patient Profile     73 y.o. female withCAD s/p PCI of the RCAin 2003(occluded by cath 03/2013, normal nuc 11/2017), atrial fibrillation on coumadin,severe AS s/p AVR with pericardial tissue valve and MAZE procedure in 2014, HTN, chronic diastolic CHF, HLD,PAD followed by Dr. 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 She was seen in office yesterday with volume overload, hypoxia (acute hypoxic respiratory failure), 25lb weight gain and felt to require IV diuresis. UE duplex neg for DVT. 2D echo 12/20/18 EF 60-6%, mildly enlarged RV with moderate pulm HTN (decreased PASP from prior), severe LAE, stable AV prosthesis, calcification of mitral valve. Concern noted for salt and fluid restriction adherence.   Assessment & Plan    1.  Acute on chronic diastolic heart failure.  She continues to diurese on IV Lasix with weight down to 170 pounds.  She had another 1800 cc out more than in last 24 hours.  2.  ESRD status post renal transplant with CKD stage III at baseline.  Creatinine is stable at 2.1 in the setting of diuresis.  3.  Paroxysmal atrial fibrillation.  She continues on Coumadin as dosed by pharmacy.  4.  Essential hypertension, blood pressure stable.  She has been taken off lisinopril and Norvasc.  5.  Bioprosthetic AVR, stable by recent echocardiogram with mean gradient 20 mmHg.  6.  CAD status post PCI of the RCA in 2003.  No active angina symptoms with minor troponin I elevation in flat pattern not suggestive of ACS.  Would continue with present dose of IV Lasix, she is diuresing consistently and renal function is holding steady.  Dry weight  is in the low 160s.  Would keep off lisinopril at this point, continue Lopressor, Crestor, and Coumadin.  Depending on blood pressure trend, may or may not need to add back low-dose Norvasc.  Signed, Rozann Lesches, MD  12/24/2018, 9:10 AM

## 2018-12-24 NOTE — Progress Notes (Signed)
ANTICOAGULATION CONSULT NOTE - Follow up  Pharmacy Consult for warfarin Indication: atrial fibrillation and hx of bioprosthetic aortic valve replacement  Allergies  Allergen Reactions  . Ibuprofen Nausea And Vomiting  . Sulfamethoxazole-Trimethoprim Itching, Swelling and Rash    Swelling of the face  . Sulfonamide Derivatives Itching, Swelling and Rash    Swelling of the face  . Tape Rash    Paper tape is ok  . Tramadol Nausea And Vomiting  . Doxycycline Nausea Only  . Hydrocil [Psyllium] Nausea And Vomiting  . Bactrim Itching, Swelling and Rash  . Red Dye Itching and Rash    Patient Measurements: Height: 5\' 1"  (154.9 cm) Weight: 170 lb 13.7 oz (77.5 kg) IBW/kg (Calculated) : 47.8  Vital Signs: Temp: 98.5 F (36.9 C) (06/07 0458) Temp Source: Oral (06/07 0458) BP: 123/56 (06/07 0843) Pulse Rate: 60 (06/07 0843)  Labs: Recent Labs    12/21/18 1021 12/21/18 1350 12/22/18 0433 12/23/18 0444 12/24/18 0525  HGB  --   --   --  12.4  --   HCT  --   --   --  41.3  --   PLT  --   --   --  210  --   LABPROT  --   --  23.3* 22.9* 25.9*  INR  --   --  2.1* 2.1* 2.4*  CREATININE  --   --  2.34* 2.26* 2.14*  TROPONINI 0.10* 0.08*  --   --   --     Estimated Creatinine Clearance: 21.7 mL/min (A) (by C-G formula based on SCr of 2.14 mg/dL (H)).   Medical History: Past Medical History:  Diagnosis Date  . Acute CHF (congestive heart failure) ([place]) 12/2018  . Adenocarcinoma of lung, stage 1, right ([place]) 11/25/2017  . Anemia, iron deficiency    of chronic disease  . Aortic stenosis    a. Severe AS by echo 11/2012.  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 Asystole ([place])    a. During ENT surgery 2005: developed marked asystole requiring CPR, felt due to vagal reaction (cath nonobst dz).  . Carotid artery disease ([place])    a. Carotid Dopplers performed in August 2013 showed 40-59% left stenosis and 0-39% right; f/u recommended in 2 years.   .  Cerebrovascular accident [facility]) 2009   a. LMCA infarct felt embolic 7824, maintained on chronic coumadin.; denies residual on 04/05/2013  . Cholelithiasis   . Chronic Persistent Atrial Fibrillation 12/31/2008   Qualifier: Diagnosis of  By: [name]    . Coronary artery disease 05/2002   a. Ant MI 2003 s/p PTCA/stent to RCA.   . Diverticulosis of colon   . Esophagitis, reflux   . ESRD (end stage renal disease) ([place])    a. Mass on L kidney per pt s/p nephrectomy - pt states not cancer - [facility] notes indicate ESRD due to HTN/DM - was previously on HD. b. Kidney transplant 02/2011.  Marland Kitchen GERD (gastroesophageal reflux disease)   . Gout   . Helicobacter pylori (H. pylori) infection    hx of  . Hemorrhoids   . Hx of colonic polyps    adenomatous  . Hyperlipidemia   . Hypertension   . Lung nodule seen on imaging study 04/07/2013   1.0 cm ground glass opacity RUL  . Myocardial infarction ([place]) 2003  . Pericardial effusion    a. Small by echo 11/2011.  . S/P aortic valve replacement with bioprosthetic valve and maze  procedure 04/12/2013   65mm Edwards Magna Ease bovine pericardial tissue valve   . S/P Maze operation for atrial fibrillation 04/12/2013   Complete bilateral atrial lesion set using bipolar radiofrequency and cryothermy ablation with clipping of LA appendage  . Sleep apnea    Pt says testing was positive, intolerant of CPAP.  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  . Type II diabetes mellitus ([place])     Assessment: 73yo female admitted with CHF exacerbation. She is on warfarin PTA for atrial fibrillation and hx of bioprosethetic aortic valve replacement. INR on admission 6/2 was subtherapeutic at 1.6. Home warfarin dose: 5mg /day  INR today remains therapeutic at 2.4  Goal of Therapy:  INR 2-3 Monitor platelets by  anticoagulation protocol: Yes   Plan:  Warfarin 5mg  PO x1 tonight Daily INR   Thank you for involving pharmacy in this patient's care.  Jackson Latino, PharmD PGY1 Pharmacy Resident Phone [phone] 12/24/2018     10:15 AM

## 2018-12-24 NOTE — Progress Notes (Signed)
Progress Note    [name]  OVZ:[id number] DOB: [birth year]  DOA: 12/19/2018 PCP: [name], MD    Brief Narrative:   Chief complaint: Follow-up shortness of breath secondary to decompensated CHF  Medical records reviewed and are as summarized below:  [name] is an 73 y.o. female with a PMH of CAD status post PCI to the RCA, atrial fibrillation on Coumadin, aortic valve stenosis status post AVR, and stage III CKD with history of renal transplantation 2012, IDDM, and chronic diastolic CHF who was admitted on 12/19/2018 for evaluation of progressive shortness of breath associated with worsening peripheral edema in the setting of reduction of her diuretic dose dating back to January.  At 20+ pound weight gain and despite increasing her torsemide to 100 mg twice daily for the past few weeks, she has not had any relief.  ED, she was noted to be hypoxic with a room air saturation of 86%.  Chest x-ray showed cardiomegaly with moderate vascular congestion and interstitial edema.  EKG showed sinus rhythm with T wave inversions that were not new.  Assessment/Plan:   Principal Problem:   Acute on chronic diastolic CHF (congestive heart failure) (HCC) with acute hypoxic respiratory failure -Still with signs of fluid overload on exam (even improving); also complaining of orthopnea and using oxygen supplementation. -Continue low-sodium diet, daily weights and strict intake and output -Patient reporting good urine output -Appreciate cardiology service assistance and recommendations; patient weight is still not at baseline (she is currently still 8 pounds above her baseline, which is 162-164 pounds). -Continue IV diuresis -No infiltrates appreciated on exam -Continue oxygen supplementation with weaning off strategy as tolerated.    Hypokalemia -From diuresis.   -Will replete and follow electrolytes trend.    Left upper extremity swelling -Previous AV fistula in place.  Edema was  worse than her baseline, but improving with diuresis.   -Left upper extremity venous duplex ultrasound negative for DVT or superficial thrombosis. -Continue monitoring    Paroxysmal atrial Fibrillation/status post aortic valve replacement with bioprosthetic valve and maze procedure -EKG and telemetry has been reviewed; patient in sinus rhythm at this time -Continue Coumadin -INR therapeutic. -Continue metoprolol for rate control.      Renal transplant recipient/immunosuppression/CKD (chronic kidney disease), stage III ([place]) -The patient is followed by Dr. Joelyn Oms of nephrology and Dr. Roxy Horseman at [facility].  Creatinine on admission 1.89 with baseline creatinine varying from 1.2-2.25.   -Continue immunosuppressive therapy with prednisone, Prograf, and tacrolimus.   -Follow renal function closely while receiving aggressive diuresis. -Creatinine currently 2.14    Diabetes mellitus due to underlying condition with hyperglycemia, with long-term current use of insulin (HCC) and renal complications -Most recent hemoglobin A1c was 8.2 in February.  R -Repeated A1c during this admission 7.9. -Continue Lantus and sliding scale insulin -Follow CBGs and further adjust hypoglycemic regimen as needed.     CAD status post PCI -Patient denies chest pain -History of PCI to the RCA in 2014.   -Will continue the use of statins and beta-blocker therapy.      Hypothyroidism -Continue Synthroid.  TSH WNL.    Essential hypertension -Overall stable -Continue lisinopril, metoprolol and amlodipine.      Obesity Body mass index is 32.28 kg/m.  -Low calorie diet, portion control and increase physical activity discussed with patient.    Constipation -Reports having good bowel movements -Continue MiraLAX 17 g daily.   Family Communication/Anticipated D/C date and plan/Code Status   DVT prophylaxis: Coumadin  ordered. Code Status: Full Code.  Family Communication: No family bedside Disposition Plan:  Home when fully diuresed, and renal function stable; most likely another 24-48 hours of IV diuresis needed; weight still not at baseline.   Medical Consultants:    Cardiology   Anti-Infectives:    None  Subjective:  No fever, reports breathing and weight still not at baseline. Patient with complaints of orthopnea and SOB with exertion. Using 2L [place].  Objective:    Vitals:   12/23/18 2206 12/24/18 0458 12/24/18 0843 12/24/18 1139  BP: 134/71 (!) 132/50 (!) 123/56 (!) 150/57  Pulse: 69 (!) 42 60 (!) 46  Resp: 20 17  17   Temp: 98.3 F (36.8 C) 98.5 F (36.9 C)  98.3 F (36.8 C)  TempSrc: Oral Oral  Oral  SpO2: 97% 90%  98%  Weight:  77.5 kg    Height:        Intake/Output Summary (Last 24 hours) at 12/24/2018 1430 Last data filed at 12/24/2018 1147 Gross per 24 hour  Intake 480 ml  Output 2600 ml  Net -2120 ml   Filed Weights   12/22/18 0531 12/23/18 0611 12/24/18 0458  Weight: 79.8 kg 78.4 kg 77.5 kg    Exam: General exam: Alert, awake, oriented x 3, denies CP, no nausea, no vomiting. Still expressing SOB with exertion and having mild complaints of orthopnea. No fever. Respiratory system: decreased BS at the bases, no fran crackles appreciated, no using accessory muscles.  Cardiovascular system:RRR. No murmurs, rubs, gallops. Gastrointestinal system: Abdomen is nondistended, soft and nontender. No organomegaly or masses felt. Normal bowel sounds heard. Central nervous system: Alert and oriented. No focal neurological deficits. Extremities: No cyanosis or clubbing, 1+ edema bilaterally. TED hoses in place.  Skin: No rashes, lesions or ulcers Psychiatry: Judgement and insight appear normal. Mood & affect appropriate.    Data Reviewed:   I have personally reviewed following labs and imaging studies:  Labs: Labs show the following:   Basic Metabolic Panel: Recent Labs  Lab 12/20/18 0442 12/21/18 0415 12/22/18 0433 12/23/18 0444 12/24/18 0525  NA 143 141  139 141 141  K 4.0 3.7 3.2* 2.9* 2.9*  CL 98 95* 90* 90* 88*  CO2 36* 36* 37* 41* 40*  GLUCOSE 234* 182* 144* 144* 130*  BUN 34* 41* 44* 51* 51*  CREATININE 1.95* 2.38* 2.34* 2.26* 2.14*  CALCIUM 9.7 10.0 9.9 10.0 9.9  MG 1.8  --   --   --  1.9   GFR Estimated Creatinine Clearance: 21.7 mL/min (A) (by C-G formula based on SCr of 2.14 mg/dL (H)).  Coagulation profile Recent Labs  Lab 12/20/18 0442 12/21/18 0415 12/22/18 0433 12/23/18 0444 12/24/18 0525  INR 1.8* 2.1* 2.1* 2.1* 2.4*    CBC: Recent Labs  Lab 12/19/18 1621 12/23/18 0444  WBC 7.4 6.0  NEUTROABS 6.0  --   HGB 12.9 12.4  HCT 43.5 41.3  MCV 82.1 81.0  PLT 205 210   CBG: Recent Labs  Lab 12/23/18 1227 12/23/18 1643 12/23/18 2211 12/24/18 0638 12/24/18 1135  GLUCAP 178* 219* 379* 103* 252*   Microbiology Recent Results (from the past 240 hour(s))  SARS Coronavirus 2 (CEPHEID - Performed in [place] hospital lab), Hosp Order     Status: None   Collection Time: 12/19/18  4:46 PM  Result Value Ref Range Status   SARS Coronavirus 2 NEGATIVE NEGATIVE Final    Comment: (NOTE) If result is NEGATIVE SARS-CoV-2 target nucleic acids are NOT DETECTED.  The SARS-CoV-2 RNA is generally detectable in upper and lower  respiratory specimens during the acute phase of infection. The lowest  concentration of SARS-CoV-2 viral copies this assay can detect is 250  copies / mL. A negative result does not preclude SARS-CoV-2 infection  and should not be used as the sole basis for treatment or other  patient management decisions.  A negative result may occur with  improper specimen collection / handling, submission of specimen other  than nasopharyngeal swab, presence of viral mutation(s) within the  areas targeted by this assay, and inadequate number of viral copies  (<250 copies / mL). A negative result must be combined with clinical  observations, patient history, and epidemiological information. If result is  POSITIVE SARS-CoV-2 target nucleic acids are DETECTED. The SARS-CoV-2 RNA is generally detectable in upper and lower  respiratory specimens dur ing the acute phase of infection.  Positive  results are indicative of active infection with SARS-CoV-2.  Clinical  correlation with patient history and other diagnostic information is  necessary to determine patient infection status.  Positive results do  not rule out bacterial infection or co-infection with other viruses. If result is PRESUMPTIVE POSTIVE SARS-CoV-2 nucleic acids MAY BE PRESENT.   A presumptive positive result was obtained on the submitted specimen  and confirmed on repeat testing.  While 2019 novel coronavirus  (SARS-CoV-2) nucleic acids may be present in the submitted sample  additional confirmatory testing may be necessary for epidemiological  and / or clinical management purposes  to differentiate between  SARS-CoV-2 and other Sarbecovirus currently known to infect humans.  If clinically indicated additional testing with an alternate test  methodology [phone]) is advised. The SARS-CoV-2 RNA is generally  detectable in upper and lower respiratory sp ecimens during the acute  phase of infection. The expected result is Negative. Fact Sheet for Patients:  StrictlyIdeas.no Fact Sheet for Healthcare Providers: BankingDealers.co.za This test is not yet approved or cleared by the [place] FDA and has been authorized for detection and/or diagnosis of SARS-CoV-2 by FDA under an Emergency Use Authorization (EUA).  This EUA will remain in effect (meaning this test can be used) for the duration of the COVID-19 declaration under Section 564(b)(1) of the Act, 21 U.S.C. section 360bbb-3(b)(1), unless the authorization is terminated or revoked sooner. Performed at [facility], [place] [address] [place], [place] [address]     Procedures and diagnostic studies:  No results  found.  Medications:   . dapsone  25 mg Oral Daily  . furosemide  40 mg Intravenous BID  . gabapentin  100 mg Oral BID  . insulin aspart  0-15 Units Subcutaneous TID WC  . insulin aspart  0-5 Units Subcutaneous QHS  . insulin glargine  15 Units Subcutaneous QHS  . levothyroxine  50 mcg Oral Q0600  . metoprolol tartrate  12.5 mg Oral BID  . polyethylene glycol  17 g Oral Daily  . predniSONE  5 mg Oral Daily  . rosuvastatin  20 mg Oral Daily  . sodium chloride flush  3 mL Intravenous Q12H  . tacrolimus  3 mg Oral BID  . warfarin  5 mg Oral ONCE-1800  . Warfarin - Pharmacist Dosing Inpatient   Does not apply q1800   Continuous Infusions: . sodium chloride       LOS: 5 days   Barton Dubois MD [phone]  [facility]   12/24/2018, 2:30 PM

## 2018-12-24 NOTE — Plan of Care (Signed)
  Problem: Activity: Goal: Capacity to carry out activities will improve Outcome: Progressing   Problem: Education: Goal: Knowledge of General Education information will improve Description Including pain rating scale, medication(s)/side effects and non-pharmacologic comfort measures Outcome: Progressing   Problem: Activity: Goal: Risk for activity intolerance will decrease Outcome: Progressing   Problem: Elimination: Goal: Will not experience complications related to bowel motility Outcome: Progressing   Problem: Safety: Goal: Ability to remain free from injury will improve Outcome: Progressing

## 2018-12-25 DIAGNOSIS — Z953 Presence of xenogenic heart valve: Secondary | ICD-10-CM

## 2018-12-25 DIAGNOSIS — J9601 Acute respiratory failure with hypoxia: Secondary | ICD-10-CM

## 2018-12-25 DIAGNOSIS — Z7901 Long term (current) use of anticoagulants: Secondary | ICD-10-CM

## 2018-12-25 DIAGNOSIS — Z940 Kidney transplant status: Secondary | ICD-10-CM

## 2018-12-25 LAB — BASIC METABOLIC PANEL
Anion gap: 12 (ref 5–15)
BUN: 51 mg/dL — ABNORMAL HIGH (ref 8–23)
CO2: 36 mmol/L — ABNORMAL HIGH (ref 22–32)
Calcium: 9.9 mg/dL (ref 8.9–10.3)
Chloride: 91 mmol/L — ABNORMAL LOW (ref 98–111)
Creatinine, Ser: 2.28 mg/dL — ABNORMAL HIGH (ref 0.44–1.00)
GFR calc Af Amer: 24 mL/min — ABNORMAL LOW (ref >60)
GFR calc non Af Amer: 20 mL/min — ABNORMAL LOW (ref >60)
Glucose, Bld: 154 mg/dL — ABNORMAL HIGH (ref 70–99)
Potassium: 3.8 mmol/L (ref 3.5–5.1)
Sodium: 139 mmol/L (ref 135–145)

## 2018-12-25 LAB — PROTIME-INR
INR: 2.5 — ABNORMAL HIGH (ref 0.8–1.2)
Prothrombin Time: 26.9 seconds — ABNORMAL HIGH (ref 11.4–15.2)

## 2018-12-25 LAB — GLUCOSE, CAPILLARY
Glucose-Capillary: 142 mg/dL — ABNORMAL HIGH (ref 70–99)
Glucose-Capillary: 179 mg/dL — ABNORMAL HIGH (ref 70–99)
Glucose-Capillary: 272 mg/dL — ABNORMAL HIGH (ref 70–99)
Glucose-Capillary: 188 mg/dL — ABNORMAL HIGH (ref 70–99)

## 2018-12-25 MED ORDER — POTASSIUM CHLORIDE 10 MEQ/100ML IV SOLN
10.0000 meq | INTRAVENOUS | Status: DC
  Administered 2018-12-25 (×2): 10 meq via INTRAVENOUS
  Filled 2018-12-25 (×2): qty 100

## 2018-12-25 MED ORDER — WARFARIN SODIUM 5 MG PO TABS
5.0000 mg | ORAL_TABLET | Freq: Once | ORAL | Status: AC
  Administered 2018-12-25: 5 mg via ORAL
  Filled 2018-12-25: qty 1

## 2018-12-25 MED ORDER — AMLODIPINE BESYLATE 2.5 MG PO TABS
5.0000 mg | ORAL_TABLET | Freq: Every day | ORAL | Status: DC
  Administered 2018-12-25 – 2018-12-26 (×2): 5 mg via ORAL
  Filled 2018-12-25 (×2): qty 2

## 2018-12-25 MED ORDER — POTASSIUM CHLORIDE CRYS ER 20 MEQ PO TBCR
40.0000 meq | EXTENDED_RELEASE_TABLET | Freq: Two times a day (BID) | ORAL | Status: DC
  Administered 2018-12-25 – 2018-12-26 (×3): 40 meq via ORAL
  Filled 2018-12-25 (×3): qty 2

## 2018-12-25 NOTE — Progress Notes (Addendum)
Progress Note  Patient Name: [name] Date of Encounter: 12/25/2018  Primary Cardiologist: [name], MD  Subjective   Pt feeling well today. Denies chest pain or SOB.  Inpatient Medications    Scheduled Meds: . dapsone  25 mg Oral Daily  . furosemide  40 mg Intravenous BID  . gabapentin  100 mg Oral BID  . insulin aspart  0-15 Units Subcutaneous TID WC  . insulin aspart  0-5 Units Subcutaneous QHS  . insulin glargine  15 Units Subcutaneous QHS  . levothyroxine  50 mcg Oral Q0600  . metoprolol tartrate  12.5 mg Oral BID  . polyethylene glycol  17 g Oral Daily  . predniSONE  5 mg Oral Daily  . rosuvastatin  20 mg Oral Daily  . sodium chloride flush  3 mL Intravenous Q12H  . tacrolimus  3 mg Oral BID  . warfarin  5 mg Oral ONCE-1800  . Warfarin - Pharmacist Dosing Inpatient   Does not apply q1800   Continuous Infusions: . sodium chloride     PRN Meds: sodium chloride, acetaminophen, calcium carbonate, ondansetron (ZOFRAN) IV, sodium chloride flush   Vital Signs    Vitals:   12/24/18 1139 12/24/18 1727 12/24/18 1929 12/25/18 0551  BP: (!) 150/57 (!) 152/62 (!) 148/66 (!) 158/62  Pulse: (!) 46 (!) 55 63 (!) 49  Resp: 17  20 20   Temp: 98.3 F (36.8 C)  98.4 F (36.9 C) 98.4 F (36.9 C)  TempSrc: Oral  Oral Oral  SpO2: 98%  98% 95%  Weight:    77.4 kg  Height:        Intake/Output Summary (Last 24 hours) at 12/25/2018 0901 Last data filed at 12/25/2018 0801 Gross per 24 hour  Intake 1360 ml  Output 2700 ml  Net -1340 ml   Filed Weights   12/23/18 0611 12/24/18 0458 12/25/18 0551  Weight: 78.4 kg 77.5 kg 77.4 kg    Physical Exam   General: Well developed, well nourished, NAD Skin: Warm, dry, intact  Neck: Negative for carotid bruits. No JVD Lungs:Clear to ausculation bilaterally. No wheezes, rales, or rhonchi. Breathing is unlabored. Cardiovascular: RRR with S1 S2. No murmurs, rubs, gallops, or LV heave appreciated. Abdomen: Soft,  non-tender, non-distended. No obvious abdominal masses. MSK: Strength and tone appear normal for age. 5/5 in all extremities Extremities: Mild 1+ BLE edema. DP/PT pulses 1+ bilaterally Neuro: Alert and oriented. No focal deficits. No facial asymmetry. MAE spontaneously. Psych: Responds to questions appropriately with normal affect.    Labs    Chemistry Recent Labs  Lab 12/22/18 0433 12/23/18 0444 12/24/18 0525  NA 139 141 141  K 3.2* 2.9* 2.9*  CL 90* 90* 88*  CO2 37* 41* 40*  GLUCOSE 144* 144* 130*  BUN 44* 51* 51*  CREATININE 2.34* 2.26* 2.14*  CALCIUM 9.9 10.0 9.9  GFRNONAA 20* 21* 22*  GFRAA 23* 24* 26*  ANIONGAP 12 10 13      Hematology Recent Labs  Lab 12/19/18 1621 12/23/18 0444  WBC 7.4 6.0  RBC 5.30* 5.10  HGB 12.9 12.4  HCT 43.5 41.3  MCV 82.1 81.0  MCH 24.3* 24.3*  MCHC 29.7* 30.0  RDW 19.9* 18.6*  PLT 205 210    Cardiac Enzymes Recent Labs  Lab 12/21/18 1021 12/21/18 1350  TROPONINI 0.10* 0.08*   No results for input(s): TROPIPOC in the last 168 hours.   BNP Recent Labs  Lab 12/19/18 1621 12/23/18 0444  BNP 1,637.0* 1,655.2*  DDimer No results for input(s): DDIMER in the last 168 hours.   Radiology    No results found.  Telemetry    12/25/2018 sinus bradycardia - Personally Reviewed  ECG    12/21/2018 AF with HR 79- Personally Reviewed  Cardiac Studies   Echocardiogram 12/20/2018: 1. The left ventricle has normal systolic function with an ejection fraction of 60-65%. The cavity size was mildly dilated. Mild basal septal hyperteophy. Left ventricular diastolic function could not be evaluated due to nondiagnostic images. No  evidence of left ventricular regional wall motion abnormalities. 2. The right ventricle has normal systolic function. The cavity was mildly enlarged. There is no increase in right ventricular wall thickness. Right ventricular systolic pressure is moderately elevated with an estimated pressure of 44.1 mmHg.  3. Left atrial size was severely dilated. 4. Right atrial size was moderately dilated. 5. Stable AV bioprosthesis that appears to be functioning normally. The mean AV gradient is 56mmHg which is down from 56mmHg a year ago. There is no perivalvular AI. 6. The mitral valve is degenerative. Mild thickening of the anterior mitral valve leaflet. Mild calcification of the anterior mitral valve leaflet. There is moderate to severe mitral annular calcification present. 7. The inferior vena cava was dilated in size with <50% respiratory variability. 8. Compared to last echo the PASP has decreased from 48mmHg to 68mmHg.   Patient Profile     73 y.o. female with a hx of CAD s/p PCI of the RCAin 2003(occluded by cath 03/2013, normal nuc 11/2017), atrial fibrillation on coumadin,severe AS s/p AVR with pericardial tissue valve and MAZE procedure in 2014, HTN, chronic diastolic CHF, HLD,PAD followed by Dr. 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 She was seen in office yesterday with volume overload, hypoxia (acute hypoxic respiratory failure), 25lb weight gain and felt to require IV diuresis. UE duplex neg for DVT. 2D echo 12/20/18 EF 60-65%, mildly enlarged RV with moderate pulm HTN (decreased PASP from prior), severe LAE, stable AV prosthesis, calcification of mitral valve. Concern noted for salt and fluid restriction adherence.  Assessment & Plan    1. Acute on chronic CHF: -Continue IV Lasix 40mg  BID>>needs transition to PO>>pt noted to be on Torsemide 100mg  twice daily at home   -Weight, 170lb today>>down from 184lb on presentation  -I&O, net negative 10L since hopstial admission  -Dry weight noted to be in the low 160lb range  - we will continue iv lasix, crea 2.1, bellow baseline  2. ESRD: -s/p renal transplant with CKD stage III at baseline  -Creatinine today, 2.14 -Per IM  3. Paroxysmal AF: -HR  stable>>sinus bradycardia   -On lopressor 12.5>>consider reduced dose  -Continue Coumadin per pharmacy  4. Essential HTN: -Stable, 158/62>148/66>152/62 -No lisinopril in the setting of renal disease  -Continue lopressor>>will restart home dose amlodipine 5 -HR 45-65 range>>asymptomtic   5. Bioprosthetic AVR: -Stable per recent echocardiogram with a mean gradient of 95mmHg   6. CAD s/p PCI to the RCA 2003: -No anginal symptoms -Mild trop elevation>>not consistent with ACS -Continue BB, statin  -No ASA in the setting of chronic Coumadin   Signed, Kathyrn Drown NP-C [place] Pager: [phone] 12/25/2018, 9:01 AM     For questions or updates, please contact   Please consult [email or web address] for contact info under Cardiology/STEMI.  The patient was seen, examined and discussed with Kathyrn Drown, NP  and I agree with the above.   The patient is  doing better and continues to diurese well, negative 10 L< still 10 lbs over the baseline, crea 2.1, bellow the the baseline of 2.2. We wil continue iv lasix for now and restart home amlodipine 5 mg po daily.   Ena Dawley, MD 12/25/2018

## 2018-12-25 NOTE — Progress Notes (Signed)
Inpatient Diabetes Program Recommendations  AACE/ADA: New Consensus Statement on Inpatient Glycemic Control (2015)  Target Ranges:  Prepandial:   less than 140 mg/dL      Peak postprandial:   less than 180 mg/dL (1-2 hours)      Critically ill patients:  140 - 180 mg/dL   Lab Results  Component Value Date   GLUCAP 142 (H) 12/25/2018   HGBA1C 7.9 (H) 12/19/2018    Review of Glycemic Control  Post-prandials elevated. May benefit from adding meal coverage insulin  Inpatient Diabetes Program Recommendations:     Novolog 3 units tidwc if pt eats > 50% meal  Continue to follow.  Thank you. Lorenda Peck, RD, LDN, CDE Inpatient Diabetes Coordinator [phone]

## 2018-12-25 NOTE — Consult Note (Signed)
   [facility] CM Inpatient Consult   12/25/2018  Caren Garske [birth year] [id number]   Follow up:  Spoke with patient via hospital room phone.  HIPAA verified.  Patient is in agreement with post hospital follow up continuing with [facility] for complex disease management needs.  Will continue to follow for disposition.  [facility] does not interfere with or negate any services arranged by the inpatient [name] team.  For questions, please contact:  Natividad Brood, RN BSN [facility] Liaison  [phone] business mobile phone Toll free office [phone]  Fax number: [phone] [email or web address]

## 2018-12-25 NOTE — Progress Notes (Signed)
ANTICOAGULATION CONSULT NOTE - Follow up  Pharmacy Consult for warfarin Indication: atrial fibrillation and hx of bioprosthetic aortic valve replacement  Allergies  Allergen Reactions  . Ibuprofen Nausea And Vomiting  . Sulfamethoxazole-Trimethoprim Itching, Swelling and Rash    Swelling of the face  . Sulfonamide Derivatives Itching, Swelling and Rash    Swelling of the face  . Tape Rash    Paper tape is ok  . Tramadol Nausea And Vomiting  . Doxycycline Nausea Only  . Hydrocil [Psyllium] Nausea And Vomiting  . Bactrim Itching, Swelling and Rash  . Red Dye Itching and Rash    Patient Measurements: Height: 5\' 1"  (154.9 cm) Weight: 170 lb 9.6 oz (77.4 kg)(scale a) IBW/kg (Calculated) : 47.8  Vital Signs: Temp: 98.4 F (36.9 C) (06/08 0551) Temp Source: Oral (06/08 0551) BP: 158/62 (06/08 0551) Pulse Rate: 49 (06/08 0551)  Labs: Recent Labs    12/23/18 0444 12/24/18 0525 12/25/18 0523  HGB 12.4  --   --   HCT 41.3  --   --   PLT 210  --   --   LABPROT 22.9* 25.9* 26.9*  INR 2.1* 2.4* 2.5*  CREATININE 2.26* 2.14*  --     Estimated Creatinine Clearance: 21.7 mL/min (A) (by C-G formula based on SCr of 2.14 mg/dL (H)).   Medical History: Past Medical History:  Diagnosis Date  . Acute CHF (congestive heart failure) ([place]) 12/2018  . Adenocarcinoma of lung, stage 1, right ([place]) 11/25/2017  . Anemia, iron deficiency    of chronic disease  . Aortic stenosis    a. Severe AS by echo 11/2012.  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 Asystole ([place])    a. During ENT surgery 2005: developed marked asystole requiring CPR, felt due to vagal reaction (cath nonobst dz).  . Carotid artery disease ([place])    a. Carotid Dopplers performed in August 2013 showed 40-59% left stenosis and 0-39% right; f/u recommended in 2 years.   . Cerebrovascular accident [facility]) 2009   a. LMCA infarct felt embolic 7412, maintained on chronic coumadin.; denies residual on  04/05/2013  . Cholelithiasis   . Chronic Persistent Atrial Fibrillation 12/31/2008   Qualifier: Diagnosis of  By: [name]    . Coronary artery disease 05/2002   a. Ant MI 2003 s/p PTCA/stent to RCA.   . Diverticulosis of colon   . Esophagitis, reflux   . ESRD (end stage renal disease) ([place])    a. Mass on L kidney per pt s/p nephrectomy - pt states not cancer - [facility] notes indicate ESRD due to HTN/DM - was previously on HD. b. Kidney transplant 02/2011.  Marland Kitchen GERD (gastroesophageal reflux disease)   . Gout   . Helicobacter pylori (H. pylori) infection    hx of  . Hemorrhoids   . Hx of colonic polyps    adenomatous  . Hyperlipidemia   . Hypertension   . Lung nodule seen on imaging study 04/07/2013   1.0 cm ground glass opacity RUL  . Myocardial infarction ([place]) 2003  . Pericardial effusion    a. Small by echo 11/2011.  . S/P aortic valve replacement with bioprosthetic valve and maze procedure 04/12/2013   25mm [facility] Ease bovine pericardial tissue valve   . S/P Maze operation for atrial fibrillation 04/12/2013   Complete bilateral atrial lesion set using bipolar radiofrequency and cryothermy ablation with clipping of LA appendage  . Sleep apnea    Pt says testing  was positive, intolerant of CPAP.  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  . Type II diabetes mellitus ([place])     Assessment: 73yo female admitted with CHF exacerbation. She is on warfarin PTA for atrial fibrillation and hx of bioprosethetic aortic valve replacement. INR on admission 6/2 was subtherapeutic at 1.6. Home warfarin dose: 5mg /day  INR today remains therapeutic at 2.5 and appears stable on home regimen. Will check surveillance CBC tomorrow morning.  Goal of Therapy:  INR 2-3 Monitor platelets by anticoagulation protocol: Yes   Plan:   Warfarin 5mg  PO x1 again tonight Daily INR    Arrie Senate, PharmD, BCPS Clinical Pharmacist [phone] Please check AMION for all [place] numbers 12/25/2018

## 2018-12-25 NOTE — Plan of Care (Signed)
  Problem: Activity: Goal: Capacity to carry out activities will improve Outcome: Progressing   Problem: Clinical Measurements: Goal: Will remain free from infection Outcome: Progressing   Problem: Activity: Goal: Risk for activity intolerance will decrease Outcome: Progressing   Problem: Elimination: Goal: Will not experience complications related to bowel motility Outcome: Progressing   Problem: Safety: Goal: Ability to remain free from injury will improve Outcome: Progressing

## 2018-12-25 NOTE — Progress Notes (Signed)
PROGRESS NOTE  [name] TIR:[id number] DOB: 1945/08/22 DOA: 12/19/2018 PCP: [name], MD   LOS: 6 days   Brief narrative: [name] is an 73 y.o. female with a PMH of CAD status post PCI to the RCA, atrial fibrillation on Coumadin, aortic valve stenosis status post AVR, and stage III CKD with history of renal transplantation 2012, IDDM, and chronic diastolic CHF who was admitted on 12/19/2018 for evaluation of progressive shortness of breath associated with worsening peripheral edema in the setting of reduction of her diuretic dose dating back to January.  At 20+ pound weight gain and despite increasing her torsemide to 100 mg twice daily for the past few weeks, she has not had any relief. In the ED, she was noted to be hypoxic with a room air saturation of 86%.  Chest x-ray showed cardiomegaly with moderate vascular congestion and interstitial edema.  EKG showed sinus rhythm with T wave inversions that were not new.  Subjective: Today, patient complains of mild pain at the infusion site.  Denies chest pain, palpitation or shortness of breath.  She has been diuresing well.  Assessment/Plan:  Principal Problem:   Acute on chronic diastolic CHF (congestive heart failure) (HCC) Active Problems:   Type 2 diabetes mellitus with renal manifestations (HCC)   AF (paroxysmal atrial fibrillation) (HCC)   Constipation   Long term (current) use of anticoagulants   Renal transplant recipient   S/P aortic valve replacement with bioprosthetic valve and maze procedure   Left upper extremity swelling   CKD (chronic kidney disease), stage III (HCC)   Hypokalemia   Immunosuppression (HCC)   Acute respiratory failure with hypoxia (HCC)   Obesity (BMI 30-39.9)  Acute on chronic diastolic CHF  with acute hypoxic respiratory failure Patient has been getting IV diuretics with improving symptoms but still is on supplemental oxygen.  Cardiology on board, managing fluid overload.  Continue CHF  protocol.  Still with signs of fluid overload on exam refusing to rehab.  Patient is negative for 9583 mL.    Hypokalemia We will continue to replenish on diuretics.  Check levels in a.m.    Left upper extremity swelling -Previous AV fistula in place.Left upper extremity venous duplex ultrasound negative for DVT or superficial thrombosis.  Improving    Paroxysmal atrial Fibrillation/status post aortic valve replacement with bioprosthetic valve and maze procedure On normal sinus rhythm at this time.  Continue Coumadin.  Continue metoprolol.    Renal transplant recipient/immunosuppression/CKD  stage III -The patient is followed by Dr. Joelyn Oms of nephrology and Dr. Roxy Horseman at [facility].  Creatinine on admission 1.89 with baseline creatinine varying from 1.2-2.25.   -Continue immunosuppressive therapy with prednisone, Prograf, and tacrolimus.   -Follow renal function closely while receiving aggressive diuresis. -Creatinine currently 2.2  Diabetes mellitus with nephropathy.  On insulin regimen.  We will continue with that.  Hemoglobin A1c of 7.9.  Continue monitoring closely. Marland Kitchen    CAD status post PCI No current chest pain.  Continue statins beta-blockers.  PCI to the RCA in 2014.      Hypothyroidism -Continue Synthroid.    TSH within normal limits.    Essential hypertension Continue lisinopril, metoprolol and amlodipine.      Obesity Body mass index is 32.28 kg/m.     Constipation Improved.  Continue MiraLAX  VTE Prophylaxis:  Coumadin   Code Status: Full code  Family Communication: None  Disposition Plan: Home likely in 1 to 2 days depending upon cardiology recommendation.   Consultants:  Cardiology  Procedures:  none  Antibiotics: Anti-infectives (From admission, onward)   Start     Dose/Rate Route Frequency Ordered Stop   12/20/18 1000  dapsone tablet 25 mg     25 mg Oral Daily 12/19/18 2042         Objective: Vitals:   12/25/18 1044 12/25/18 1121   BP: 132/68 (!) 158/89  Pulse: (!) 51 62  Resp: 20 20  Temp: 98.8 F (37.1 C) 98.2 F (36.8 C)  SpO2: 95% 97%    Intake/Output Summary (Last 24 hours) at 12/25/2018 1146 Last data filed at 12/25/2018 0941 Gross per 24 hour  Intake 1600 ml  Output 2700 ml  Net -1100 ml   Filed Weights   12/23/18 0611 12/24/18 0458 12/25/18 0551  Weight: 78.4 kg 77.5 kg 77.4 kg   Body mass index is 32.23 kg/m.   Physical Exam: GENERAL: Patient is alert awake and oriented. Not in obvious distress. HENT: No scleral pallor or icterus. Pupils equally reactive to light. Oral mucosa is moist NECK: is supple, no palpable thyroid enlargement. CHEST: Clear to auscultation. No crackles or wheezes. Non tender on palpation. Diminished breath sounds bilaterally. CVS: S1 and S2 heard, no murmur. Regular rate and rhythm. No pericardial rub. ABDOMEN: Soft, non-tender, bowel sounds are present. No palpable hepato-splenomegaly. EXTREMITIES:.  Bilateral lower extremity pedal edema noted CNS: Cranial nerves are intact. No focal motor or sensory deficits. SKIN: warm and dry without rashes.  Data Review: I have personally reviewed the following laboratory data and studies,  CBC: Recent Labs  Lab 12/19/18 1621 12/23/18 0444  WBC 7.4 6.0  NEUTROABS 6.0  --   HGB 12.9 12.4  HCT 43.5 41.3  MCV 82.1 81.0  PLT 205 604   Basic Metabolic Panel: Recent Labs  Lab 12/20/18 0442 12/21/18 0415 12/22/18 0433 12/23/18 0444 12/24/18 0525  NA 143 141 139 141 141  K 4.0 3.7 3.2* 2.9* 2.9*  CL 98 95* 90* 90* 88*  CO2 36* 36* 37* 41* 40*  GLUCOSE 234* 182* 144* 144* 130*  BUN 34* 41* 44* 51* 51*  CREATININE 1.95* 2.38* 2.34* 2.26* 2.14*  CALCIUM 9.7 10.0 9.9 10.0 9.9  MG 1.8  --   --   --  1.9   Liver Function Tests: No results for input(s): AST, ALT, ALKPHOS, BILITOT, PROT, ALBUMIN in the last 168 hours. No results for input(s): LIPASE, AMYLASE in the last 168 hours. No results for input(s): AMMONIA in the  last 168 hours. Cardiac Enzymes: Recent Labs  Lab 12/21/18 1021 12/21/18 1350  TROPONINI 0.10* 0.08*   BNP (last 3 results) Recent Labs    02/03/18 2359 12/19/18 1621 12/23/18 0444  BNP 1,181.3* 1,637.0* 1,655.2*    ProBNP (last 3 results) No results for input(s): PROBNP in the last 8760 hours.  CBG: Recent Labs  Lab 12/24/18 1135 12/24/18 1633 12/24/18 2150 12/25/18 0551 12/25/18 1123  GLUCAP 252* 217* 245* 142* 179*   Recent Results (from the past 240 hour(s))  SARS Coronavirus 2 (CEPHEID - Performed in [employer] hospital lab), Hosp Order     Status: None   Collection Time: 12/19/18  4:46 PM  Result Value Ref Range Status   SARS Coronavirus 2 NEGATIVE NEGATIVE Final    Comment: (NOTE) If result is NEGATIVE SARS-CoV-2 target nucleic acids are NOT DETECTED. The SARS-CoV-2 RNA is generally detectable in upper and lower  respiratory specimens during the acute phase of infection. The lowest  concentration of SARS-CoV-2 viral copies this  assay can detect is 250  copies / mL. A negative result does not preclude SARS-CoV-2 infection  and should not be used as the sole basis for treatment or other  patient management decisions.  A negative result may occur with  improper specimen collection / handling, submission of specimen other  than nasopharyngeal swab, presence of viral mutation(s) within the  areas targeted by this assay, and inadequate number of viral copies  (<250 copies / mL). A negative result must be combined with clinical  observations, patient history, and epidemiological information. If result is POSITIVE SARS-CoV-2 target nucleic acids are DETECTED. The SARS-CoV-2 RNA is generally detectable in upper and lower  respiratory specimens dur ing the acute phase of infection.  Positive  results are indicative of active infection with SARS-CoV-2.  Clinical  correlation with patient history and other diagnostic information is  necessary to determine patient  infection status.  Positive results do  not rule out bacterial infection or co-infection with other viruses. If result is PRESUMPTIVE POSTIVE SARS-CoV-2 nucleic acids MAY BE PRESENT.   A presumptive positive result was obtained on the submitted specimen  and confirmed on repeat testing.  While 2019 novel coronavirus  (SARS-CoV-2) nucleic acids may be present in the submitted sample  additional confirmatory testing may be necessary for epidemiological  and / or clinical management purposes  to differentiate between  SARS-CoV-2 and other Sarbecovirus currently known to infect humans.  If clinically indicated additional testing with an alternate test  methodology [phone]) is advised. The SARS-CoV-2 RNA is generally  detectable in upper and lower respiratory sp ecimens during the acute  phase of infection. The expected result is Negative. Fact Sheet for Patients:  StrictlyIdeas.no Fact Sheet for Healthcare Providers: BankingDealers.co.za This test is not yet approved or cleared by the [place] FDA and has been authorized for detection and/or diagnosis of SARS-CoV-2 by FDA under an Emergency Use Authorization (EUA).  This EUA will remain in effect (meaning this test can be used) for the duration of the COVID-19 declaration under Section 564(b)(1) of the Act, 21 U.S.C. section 360bbb-3(b)(1), unless the authorization is terminated or revoked sooner. Performed at [facility], [place] [address], [place], [place] [address]      Studies: No results found.  Scheduled Meds: . amLODipine  5 mg Oral Daily  . dapsone  25 mg Oral Daily  . furosemide  40 mg Intravenous BID  . gabapentin  100 mg Oral BID  . insulin aspart  0-15 Units Subcutaneous TID WC  . insulin aspart  0-5 Units Subcutaneous QHS  . insulin glargine  15 Units Subcutaneous QHS  . levothyroxine  50 mcg Oral Q0600  . metoprolol tartrate  12.5 mg Oral BID  . polyethylene  glycol  17 g Oral Daily  . potassium chloride  40 mEq Oral BID  . predniSONE  5 mg Oral Daily  . rosuvastatin  20 mg Oral Daily  . sodium chloride flush  3 mL Intravenous Q12H  . tacrolimus  3 mg Oral BID  . warfarin  5 mg Oral ONCE-1800  . Warfarin - Pharmacist Dosing Inpatient   Does not apply q1800    Continuous Infusions: . sodium chloride 250 mL (12/25/18 1108)  . potassium chloride 10 mEq (12/25/18 1057)     Flora Lipps, MD  [facility] 12/25/2018

## 2018-12-26 ENCOUNTER — Telehealth: Payer: Self-pay

## 2018-12-26 ENCOUNTER — Encounter: Payer: Self-pay | Admitting: Cardiology

## 2018-12-26 ENCOUNTER — Telehealth: Payer: Self-pay | Admitting: Cardiology

## 2018-12-26 ENCOUNTER — Other Ambulatory Visit: Payer: Self-pay | Admitting: *Deleted

## 2018-12-26 LAB — PROTIME-INR
INR: 2.8 — ABNORMAL HIGH (ref 0.8–1.2)
Prothrombin Time: 28.9 seconds — ABNORMAL HIGH (ref 11.4–15.2)

## 2018-12-26 LAB — PHOSPHORUS: Phosphorus: 3 mg/dL (ref 2.5–4.6)

## 2018-12-26 LAB — BASIC METABOLIC PANEL
Anion gap: 11 (ref 5–15)
BUN: 52 mg/dL — ABNORMAL HIGH (ref 8–23)
CO2: 37 mmol/L — ABNORMAL HIGH (ref 22–32)
Calcium: 9.9 mg/dL (ref 8.9–10.3)
Chloride: 92 mmol/L — ABNORMAL LOW (ref 98–111)
Creatinine, Ser: 2.17 mg/dL — ABNORMAL HIGH (ref 0.44–1.00)
GFR calc Af Amer: 25 mL/min — ABNORMAL LOW (ref >60)
GFR calc non Af Amer: 22 mL/min — ABNORMAL LOW (ref >60)
Glucose, Bld: 268 mg/dL — ABNORMAL HIGH (ref 70–99)
Potassium: 4.8 mmol/L (ref 3.5–5.1)
Sodium: 140 mmol/L (ref 135–145)

## 2018-12-26 LAB — CBC
HCT: 40.3 % (ref 36.0–46.0)
Hemoglobin: 12.1 g/dL (ref 12.0–15.0)
MCH: 24.3 pg — ABNORMAL LOW (ref 26.0–34.0)
MCHC: 30 g/dL (ref 30.0–36.0)
MCV: 81.1 fL (ref 80.0–100.0)
Platelets: 200 10*3/uL (ref 150–400)
RBC: 4.97 MIL/uL (ref 3.87–5.11)
RDW: 18.2 % — ABNORMAL HIGH (ref 11.5–15.5)
WBC: 6.9 10*3/uL (ref 4.0–10.5)
nRBC: 0 % (ref 0.0–0.2)

## 2018-12-26 LAB — GLUCOSE, CAPILLARY
Glucose-Capillary: 189 mg/dL — ABNORMAL HIGH (ref 70–99)
Glucose-Capillary: 154 mg/dL — ABNORMAL HIGH (ref 70–99)

## 2018-12-26 LAB — MAGNESIUM: Magnesium: 2 mg/dL (ref 1.7–2.4)

## 2018-12-26 MED ORDER — TORSEMIDE 100 MG PO TABS
100.0000 mg | ORAL_TABLET | Freq: Two times a day (BID) | ORAL | Status: DC
  Administered 2018-12-26: 100 mg via ORAL
  Filled 2018-12-26: qty 1

## 2018-12-26 MED ORDER — WARFARIN SODIUM 5 MG PO TABS: 5.0000 mg | ORAL_TABLET | Freq: Once | ORAL | Status: DC

## 2018-12-26 MED ORDER — SPIRONOLACTONE 25 MG PO TABS: 25.0000 mg | ORAL_TABLET | Freq: Every day | ORAL | 2 refills | Status: DC

## 2018-12-26 MED ORDER — SPIRONOLACTONE 25 MG PO TABS
25.0000 mg | ORAL_TABLET | Freq: Every day | ORAL | Status: DC
  Administered 2018-12-26: 25 mg via ORAL
  Filled 2018-12-26: qty 1

## 2018-12-26 NOTE — Progress Notes (Signed)
ANTICOAGULATION CONSULT NOTE - Follow up  Pharmacy Consult for warfarin Indication: atrial fibrillation and hx of bioprosthetic aortic valve replacement  Allergies  Allergen Reactions  . Ibuprofen Nausea And Vomiting  . Sulfamethoxazole-Trimethoprim Itching, Swelling and Rash    Swelling of the face  . Sulfonamide Derivatives Itching, Swelling and Rash    Swelling of the face  . Tape Rash    Paper tape is ok  . Tramadol Nausea And Vomiting  . Doxycycline Nausea Only  . Hydrocil [Psyllium] Nausea And Vomiting  . Bactrim Itching, Swelling and Rash  . Red Dye Itching and Rash    Patient Measurements: Height: 5\' 1"  (154.9 cm) Weight: 170 lb 3.2 oz (77.2 kg)(scale a) IBW/kg (Calculated) : 47.8  Vital Signs: Temp: 98.4 F (36.9 C) (06/09 0341) Temp Source: Oral (06/09 0341) BP: 150/65 (06/09 0341) Pulse Rate: 49 (06/09 0341)  Labs: Recent Labs    12/24/18 0525 12/25/18 0523 12/25/18 1117 12/26/18 0331  HGB  --   --   --  12.1  HCT  --   --   --  40.3  PLT  --   --   --  200  LABPROT 25.9* 26.9*  --  28.9*  INR 2.4* 2.5*  --  2.8*  CREATININE 2.14*  --  2.28* 2.17*    Estimated Creatinine Clearance: 21.4 mL/min (A) (by C-G formula based on SCr of 2.17 mg/dL (H)).   Medical History: Past Medical History:  Diagnosis Date  . Acute CHF (congestive heart failure) ([place]) 12/2018  . Adenocarcinoma of lung, stage 1, right ([place]) 11/25/2017  . Anemia, iron deficiency    of chronic disease  . Aortic stenosis    a. Severe AS by echo 11/2012.  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 Asystole ([place])    a. During ENT surgery 2005: developed marked asystole requiring CPR, felt due to vagal reaction (cath nonobst dz).  . Carotid artery disease ([place])    a. Carotid Dopplers performed in August 2013 showed 40-59% left stenosis and 0-39% right; f/u recommended in 2 years.   . Cerebrovascular accident [facility]) 2009   a. LMCA infarct felt embolic 3151, maintained on  chronic coumadin.; denies residual on 04/05/2013  . Cholelithiasis   . Chronic Persistent Atrial Fibrillation 12/31/2008   Qualifier: Diagnosis of  By: [name]    . Coronary artery disease 05/2002   a. Ant MI 2003 s/p PTCA/stent to RCA.   . Diverticulosis of colon   . Esophagitis, reflux   . ESRD (end stage renal disease) ([place])    a. Mass on L kidney per pt s/p nephrectomy - pt states not cancer - [facility] notes indicate ESRD due to HTN/DM - was previously on HD. b. Kidney transplant 02/2011.  Marland Kitchen GERD (gastroesophageal reflux disease)   . Gout   . Helicobacter pylori (H. pylori) infection    hx of  . Hemorrhoids   . Hx of colonic polyps    adenomatous  . Hyperlipidemia   . Hypertension   . Lung nodule seen on imaging study 04/07/2013   1.0 cm ground glass opacity RUL  . Myocardial infarction ([place]) 2003  . Pericardial effusion    a. Small by echo 11/2011.  . 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 S/P Maze operation for atrial fibrillation 04/12/2013   Complete bilateral atrial lesion set using bipolar radiofrequency  and cryothermy ablation with clipping of LA appendage  . Sleep apnea    Pt says testing was positive, intolerant of CPAP.  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  . Type II diabetes mellitus ([place])     Assessment: 73yo female admitted with CHF exacerbation. She is on warfarin PTA for atrial fibrillation and hx of bioprosethetic aortic valve replacement. INR on admission 6/2 was subtherapeutic at 1.6. Home warfarin dose: 5mg /day.  INR today remains therapeutic at 2.8, CBC stable and wnl.  Goal of Therapy:  INR 2-3 Monitor platelets by anticoagulation protocol: Yes   Plan:  Warfarin 5mg  PO x1 again  tonight Daily INR    Arrie Senate, PharmD, BCPS Clinical Pharmacist [phone] Please check AMION for all [place] numbers 12/26/2018

## 2018-12-26 NOTE — Discharge Summary (Signed)
Physician Discharge Summary  [name] ELF:[id number] DOB: Nov 21, 1945 DOA: 12/19/2018  PCP: [name], MD  Admit date: 12/19/2018 Discharge date: 12/26/2018  Admitted From: Home  Discharge disposition: Home   Recommendations for Outpatient Follow-Up:   Follow up with cardiology as outpatient in 1 week.  Office to schedule appointment.  Follow-up with your primary care physician as scheduled by you.     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  Diet recommendation: Low sodium, heart healthy.  Carbohydrate-modified.   Wound care: None.  Code status: Full.   History of Present Illness:   [name] an 73 y.o.femalewith a PMH of CAD status post PCI to the RCA, atrial fibrillation on Coumadin, aortic valve stenosis status post AVR, and stage III CKD with history of renal transplantation 2012, IDDM, and chronic diastolic CHF who was admitted on 12/19/2018 for evaluation of progressive shortness of breath associated with worsening peripheral edema in the setting of reduction of her diuretic dose dating back to January. At 20+ pound weight gain and despite increasing her torsemide to 100 mg twice daily for the past few weeks, she has not had any relief. In theED, she was noted to be hypoxic with a room air saturation of 86%. Chest x-ray showed cardiomegaly with moderate vascular congestion and  interstitial edema. EKG showed sinus rhythm with T wave inversions that were not new.   Hospital Course:  Patient was admitted to the hospital and following conditions were addressed during hospitalization,  Acute on chronic diastolic CHF  with acute on chronic hypoxic respiratory failure Patient was seen by cardiology during hospitalization and was on IV diuretic regimen.  Patient had gradual improvement in her symptoms including significant diuresis and weight loss.  She does have baseline oxygen requirement at home intermittently.  At this time, patient has been reassessed by cardiology and has been considered stable for disposition home.  She will continue on torsemide but spironolactone has been added to the regimen.  Hypokalemia Improved during hospitalization.  Patient will be on spironolactone on discharge.  Will need to follow-up BMP after discharge.  Left upper extremity swelling -Previous AV fistula in place. Left upper extremity venous duplex ultrasound negative for DVT or superficial thrombosis.  Improved  Paroxysmal atrial Fibrillation/status post aortic valve replacement with bioprosthetic valve and maze procedure Patient had normal sinus rhythm during hospitalization.  She will be continued on metoprolol and Coumadin on discharge.  Renal transplant recipient/immunosuppression/CKD  stage III The patient is followed by Dr. Joelyn Oms of nephrology and Dr. Roxy [facility]. Creatinine on admission 1.89 with baseline creatinine varying from 1.2-2.25. Continue immunosuppressive therapy with prednisone, Prograf, and tacrolimus.   Follow renal function closely after discharge.  Patient was advised to follow-up with her nephrologist as outpatient.  Diabetes mellitus with nephropathy.    Insulin regimen to be continued on discharge. Marland Kitchen  CAD status post PCI Patient with a history of PCI to RCA  in 2014.  She had no acute issues during hospitalization.  Continue statins  beta-blockers.    Hypothyroidism -Continue Synthroid.   TSH within normal limits during hospitalization..  Essential hypertension Continue metoprolol and amlodipine. Spironolactone has been added.  Will hold her lisinopril on discharge.  Obesity Body mass index is 32.28 kg/m.  Constipation Improved.  Continue MiraLAX  Disposition.  At this time, patient has been seen by cardiology.  She is stable for disposition home.  I also spoke with the patient's husband on the phone and updated him about the changes in the medications and the need for outpatient follow-up with cardiology.   Medical Consultants:    Cardiology  Subjective:   Today, patient feels better.  Denies any overt chest pain, shortness of breath, fever or chills.  Has significant improvement in her edema and symptoms.  Discharge Exam:   Vitals:   12/26/18 0811 12/26/18 1115  BP: (!) 156/73 (!) 141/79  Pulse: 69 80  Resp: 18 18  Temp: 99 F (37.2 C) 98.5 F (36.9 C)  SpO2: 95% 96%   Vitals:   12/25/18 1930 12/26/18 0341 12/26/18 0811 12/26/18 1115  BP: 129/63 (!) 150/65 (!) 156/73 (!) 141/79  Pulse: (!) 58 (!) 49 69 80  Resp:  18 18 18   Temp: 98.7 F (37.1 C) 98.4 F (36.9 C) 99 F (37.2 C) 98.5 F (36.9 C)  TempSrc: Oral Oral Oral Oral  SpO2: 96% 98% 95% 96%  Weight:  77.2 kg    Height:        General exam: Appears calm and comfortable ,Not in distress, on nasal cannula HEENT:PERRL,Oral mucosa moist Respiratory system: Bilateral equal air entry, normal vesicular breath sounds, no wheezes or crackles  Cardiovascular system: S1 & S2 heard, RRR.  Gastrointestinal system: Abdomen is nondistended, soft and nontender. No organomegaly or masses felt. Normal bowel sounds heard. Central nervous system: Alert and oriented. No focal neurological deficits. Extremities: Trace bilateral lower extremity pedal edema noted., no clubbing ,no cyanosis, distal peripheral pulses palpable. Skin: No  rashes, lesions or ulcers,no icterus ,no pallor MSK: Normal muscle bulk,tone ,power    Procedures:    None  The results of significant diagnostics from this hospitalization (including imaging, microbiology, ancillary and laboratory) are listed below for reference.     Diagnostic Studies:   Dg Chest Port 1 View  Result Date: 12/19/2018 CLINICAL DATA:  Diffuse soft tissue swelling and shortness of breath for several days. EXAM: PORTABLE CHEST 1 VIEW COMPARISON:  None. FINDINGS: The heart is enlarged but stable. Stable surgical changes from aortic valve replacement surgery. A left atrial closure device is noted. There is moderate central vascular congestion and mild interstitial edema suggesting CHF. Small effusions are also suspected. No definite infiltrates. The bony thorax is grossly intact. IMPRESSION: CHF. Electronically Signed   By: Marijo Sanes M.D.   On: 12/19/2018 16:56   Vas [place] Upper Extremity Venous Duplex  Result Date: 12/20/2018 UPPER VENOUS STUDY  Indications: Swelling Performing Technologist: June Leap RDMS, RVT  Examination Guidelines: A complete evaluation includes B-mode imaging, spectral Doppler, color Doppler, and power Doppler as needed of all accessible portions of each vessel. Bilateral testing is considered an integral part of a complete examination. Limited examinations for reoccurring indications may be performed as noted.  Right Findings: +----------+------------+---------+-----------+----------+-------+  RIGHT      Compressible Phasicity Spontaneous Properties Summary  +----------+------------+---------+-----------+----------+-------+  Subclavian  Yes        Yes                         +----------+------------+---------+-----------+----------+-------+  Left Findings: +----------+------------+---------+-----------+----------+---------------------+  LEFT       Compressible Phasicity Spontaneous Properties        Summary          +----------+------------+---------+-----------+----------+---------------------+  IJV                        Yes        Yes                 unable to compress                                                                due to increased                                                                  venous pressure     +----------+------------+---------+-----------+----------+---------------------+  Subclavian                 Yes        Yes                 unable to compress                                                                due to increased                                                                  venous pressure     +----------+------------+---------+-----------+----------+---------------------+  Axillary       Full        Yes        Yes                                       +----------+------------+---------+-----------+----------+---------------------+  Brachial       Full        Yes        Yes                                       +----------+------------+---------+-----------+----------+---------------------+  Radial         Full                                                             +----------+------------+---------+-----------+----------+---------------------+  Ulnar          Full                                                             +----------+------------+---------+-----------+----------+---------------------+  Cephalic       Full                                                             +----------+------------+---------+-----------+----------+---------------------+  Basilic        Full                                                             +----------+------------+---------+-----------+----------+---------------------+ HD access patent.  Summary:  Right: No evidence of thrombosis in the subclavian.  Left: No evidence of deep vein thrombosis in the upper extremity. No evidence of superficial vein thrombosis in the upper extremity.  *See table(s) above for  measurements and observations.  Diagnosing physician: Curt Jews MD Electronically signed by Curt Jews MD on 12/20/2018 at 2:35:12 PM.    Final      Labs:   Basic Metabolic Panel: Recent Labs  Lab 12/20/18 9379  12/22/18 0433 12/23/18 0444 12/24/18 0525 12/25/18 1117 12/26/18 0331  NA 143   < > 139 141 141 139 140  K 4.0   < > 3.2* 2.9* 2.9* 3.8 4.8  CL 98   < > 90* 90* 88* 91* 92*  CO2 36*   < > 37* 41* 40* 36* 37*  GLUCOSE 234*   < > 144* 144* 130* 154* 268*  BUN 34*   < > 44* 51* 51* 51* 52*  CREATININE 1.95*   < > 2.34* 2.26* 2.14* 2.28* 2.17*  CALCIUM 9.7   < > 9.9 10.0 9.9 9.9 9.9  MG 1.8  --   --   --  1.9  --  2.0  PHOS  --   --   --   --   --   --  3.0   < > = values in this interval not displayed.   GFR Estimated Creatinine Clearance: 21.4 mL/min (A) (by C-G formula based on SCr of 2.17 mg/dL (H)). Liver Function Tests: No results for input(s): AST, ALT, ALKPHOS, BILITOT, PROT, ALBUMIN in the last 168 hours. No results for input(s): LIPASE, AMYLASE in the last 168 hours. No results for input(s): AMMONIA in the last 168 hours. Coagulation profile Recent Labs  Lab 12/22/18 0433 12/23/18 0444 12/24/18 0525 12/25/18 0523 12/26/18 0331  INR 2.1* 2.1* 2.4* 2.5* 2.8*    CBC: Recent Labs  Lab 12/19/18 1621 12/23/18 0444 12/26/18 0331  WBC 7.4 6.0 6.9  NEUTROABS 6.0  --   --   HGB 12.9 12.4 12.1  HCT 43.5 41.3 40.3  MCV 82.1 81.0 81.1  PLT 205 210 200   Cardiac Enzymes: Recent Labs  Lab 12/21/18 1021 12/21/18 1350  TROPONINI 0.10* 0.08*   BNP:  Invalid input(s): POCBNP CBG: Recent Labs  Lab 12/25/18 0551 12/25/18 1123 12/25/18 1617 12/25/18 2117 12/26/18 0613  GLUCAP 142* 179* 272* 188* 189*   D-Dimer No results for input(s): DDIMER in the last 72 hours. Hgb A1c No results for input(s): HGBA1C in the last 72 hours. Lipid Profile No results for input(s): CHOL, HDL, LDLCALC, TRIG, CHOLHDL, LDLDIRECT in the last 72 hours. Thyroid function  studies No results for input(s): TSH, T4TOTAL, T3FREE, THYROIDAB in the last 72 hours.  Invalid input(s): FREET3 Anemia work up No results for input(s): VITAMINB12, FOLATE, FERRITIN, TIBC, IRON, RETICCTPCT in the last 72 hours. Microbiology Recent Results (from the past 240 hour(s))  SARS Coronavirus 2 (CEPHEID - Performed in [place] hospital lab), Hosp Order     Status: None   Collection Time: 12/19/18  4:46 PM  Result Value Ref Range Status   SARS Coronavirus 2 NEGATIVE NEGATIVE Final    Comment: (NOTE) If result is NEGATIVE SARS-CoV-2 target nucleic acids are NOT DETECTED. The SARS-CoV-2 RNA is generally detectable in upper and lower  respiratory specimens during the acute phase of infection. The lowest  concentration of SARS-CoV-2 viral copies this assay can detect is 250  copies / mL. A negative result does not preclude SARS-CoV-2 infection  and should not be used as the sole basis for treatment or other  patient management decisions.  A negative result may occur with  improper specimen collection / handling, submission of specimen other  than nasopharyngeal swab, presence of viral mutation(s) within the  areas targeted by this assay, and inadequate number of viral copies  (<250 copies / mL). A negative result must be combined with clinical  observations, patient history, and epidemiological information. If result is POSITIVE SARS-CoV-2 target nucleic acids are DETECTED. The SARS-CoV-2 RNA is generally detectable in upper and lower  respiratory specimens dur ing the acute phase of infection.  Positive  results are indicative of active infection with SARS-CoV-2.  Clinical  correlation with patient history and other diagnostic information is  necessary to determine patient infection status.  Positive results do  not rule out bacterial infection or co-infection with other viruses. If result is PRESUMPTIVE POSTIVE SARS-CoV-2 nucleic acids MAY BE PRESENT.   A presumptive  positive result was obtained on the submitted specimen  and confirmed on repeat testing.  While 2019 novel coronavirus  (SARS-CoV-2) nucleic acids may be present in the submitted sample  additional confirmatory testing may be necessary for epidemiological  and / or clinical management purposes  to differentiate between  SARS-CoV-2 and other Sarbecovirus currently known to infect humans.  If clinically indicated additional testing with an alternate test  methodology [id number]) is advised. The SARS-CoV-2 RNA is generally  detectable in upper and lower respiratory sp ecimens during the acute  phase of infection. The expected result is Negative. Fact Sheet for Patients:  StrictlyIdeas.no Fact Sheet for Healthcare Providers: BankingDealers.co.za This test is not yet approved or cleared by the [place] FDA and has been authorized for detection and/or diagnosis of SARS-CoV-2 by FDA under an Emergency Use Authorization (EUA).  This EUA will remain in effect (meaning this test can be used) for the duration of the COVID-19 declaration under Section 564(b)(1) of the Act, 21 U.S.C. section 360bbb-3(b)(1), unless the authorization is terminated or revoked sooner. Performed at [facility], [place] [address], [place], [place] [address]      Discharge Instructions:   Discharge Instructions    Diet - low sodium heart healthy  Complete by:  As directed    Discharge instructions   Complete by:  As directed    Please follow-up at the cardiology clinic in 1 week for blood work and checkup.  Follow-up with your primary care physician in 1 to 2 weeks/scheduled by you.  Spironolactone has been added and lisinopril has been discontinued.   Increase activity slowly   Complete by:  As directed      Allergies as of 12/26/2018      Reactions   Ibuprofen Nausea And Vomiting   Sulfamethoxazole-trimethoprim Itching, Swelling, Rash   Swelling of the  face   Sulfonamide Derivatives Itching, Swelling, Rash   Swelling of the face   Tape Rash   Paper tape is ok   Tramadol Nausea And Vomiting   Doxycycline Nausea Only   Hydrocil [psyllium] Nausea And Vomiting   Bactrim Itching, Swelling, Rash   Red Dye Itching, Rash      Medication List    STOP taking these medications   amoxicillin 500 MG capsule Commonly known as:  AMOXIL     TAKE these medications   acetaminophen 500 MG tablet Commonly known as:  TYLENOL Take 500 mg by mouth every 6 (six) hours as needed for mild pain or headache.   amLODipine 10 MG tablet Commonly known as:  NORVASC Take 0.5 tablets (5 mg total) by mouth daily. What changed:  when to take this   Centrum Silver 50+Women Tabs Take 1 tablet by mouth daily with breakfast.   dapsone 25 MG tablet Take 25 mg by mouth daily.   gabapentin 100 MG capsule Commonly known as:  NEURONTIN Take 100 mg by mouth 2 (two) times daily.   Insulin Glargine (1 Unit Dial) 300 UNIT/ML Sopn Commonly known as:  Toujeo SoloStar Inject 24 Units into the skin every morning. Titrate up by 1 unit a day for goal sugars of 100-130 up to 50 units a day What changed:    how much to take  when to take this  additional instructions   Insulin Pen Needle 31G X 5 MM Misc Commonly known as:  B-D UF III MINI PEN NEEDLES USE TO ADMINISTER INSULIN FOUR TIMES A DAY DX E11.9   levothyroxine 25 MCG tablet Commonly known as:  SYNTHROID TAKE 2 TABLETS (50 MCG TOTAL) BY MOUTH DAILY. What changed:  when to take this   lisinopril 5 MG tablet Commonly known as:  ZESTRIL Take 5 mg by mouth at bedtime.   metoprolol tartrate 25 MG tablet Commonly known as:  LOPRESSOR TAKE 0.5 TABLETS (12.5 MG TOTAL) BY MOUTH 2 (TWO) TIMES DAILY. TAKE 1 TABLET BY MOUTH TWICE A DAY What changed:  additional instructions   NON FORMULARY Apply 1 application topically See admin instructions. Melaleuca Extra Strength pain cream: Apply to painful sites as  needed   NON FORMULARY Apply 1 application topically See admin instructions. Hempvana Pain Relief Cream: Apply to painful sites as needed   OneTouch Delica Lancets 41D Misc Use to check blood sugars three times a day DX E11.9   OneTouch Verio test strip Generic drug:  glucose blood USE TO TEST 3 TIMES DAILY. DX E11.9 What changed:  See the new instructions.   OXYGEN Inhale 3 L into the lungs as needed (for shortness of breath).   predniSONE 5 MG tablet Commonly known as:  DELTASONE Take 5 mg by mouth daily.   rosuvastatin 20 MG tablet Commonly known as:  Crestor Take 1 tablet (20 mg total) by mouth daily.  spironolactone 25 MG tablet Commonly known as:  ALDACTONE Take 1 tablet (25 mg total) by mouth daily. Start taking on:  December 27, 2018   Systane Balance 0.6 % Soln Generic drug:  Propylene Glycol Place 1-2 drops into both eyes 3 (three) times daily as needed (for dryness).   tacrolimus 1 MG capsule Commonly known as:  PROGRAF Take 3 mg by mouth 2 (two) times daily.   torsemide 100 MG tablet Commonly known as:  DEMADEX Take 100 mg by mouth 2 (two) times a day.   warfarin 5 MG tablet Commonly known as:  COUMADIN Take as directed. If you are unsure how to take this medication, talk to your nurse or doctor. Original instructions:  Take 1/2 to 1 tablet by mouth daily as directed What changed:    how much to take  how to take this  when to take this  additional instructions      Follow-up Information    Crenshaw, Denice Bors, MD Follow up.   Specialty:  Cardiology Why:  Someone from our office will call and let you know the exact date and time of your follow-up appointment Contact information: [place] [address] [phone]           Time coordinating discharge: 39 minutes  Signed:  Rosangela Fehrenbach  [facility] 12/26/2018, 12:39 PM

## 2018-12-26 NOTE — Patient Outreach (Signed)
[facility]) Care Management  12/26/2018  Love Chowning 1946/04/22 [id number]   RN Health Coach Discipline Closure  Referral Date:01/12/2017 Referral Source:[facility] RN Case Manager Reason for Referral:CHF education Insurance:Medicare   Outreach Attempt:  Received notification from [facility] [place] that patient will be followed by [facility] on discharge home.  Plan: RN Health Coach will send primary care provider Discipline Closure Letter. RN Health Coach will close Disease Management case.  [place] [phone] [email or web address] [email or web address]

## 2018-12-26 NOTE — Progress Notes (Addendum)
Progress Note  Patient Name: [name] Date of Encounter: 12/26/2018  Primary Cardiologist: [name], MD  Subjective   Pt feeling well today. Denies SOB or chest pain   Inpatient Medications    Scheduled Meds: . amLODipine  5 mg Oral Daily  . dapsone  25 mg Oral Daily  . furosemide  40 mg Intravenous BID  . gabapentin  100 mg Oral BID  . insulin aspart  0-15 Units Subcutaneous TID WC  . insulin aspart  0-5 Units Subcutaneous QHS  . insulin glargine  15 Units Subcutaneous QHS  . levothyroxine  50 mcg Oral Q0600  . metoprolol tartrate  12.5 mg Oral BID  . polyethylene glycol  17 g Oral Daily  . potassium chloride  40 mEq Oral BID  . predniSONE  5 mg Oral Daily  . rosuvastatin  20 mg Oral Daily  . sodium chloride flush  3 mL Intravenous Q12H  . tacrolimus  3 mg Oral BID  . warfarin  5 mg Oral ONCE-1800  . Warfarin - Pharmacist Dosing Inpatient   Does not apply q1800   Continuous Infusions: . sodium chloride Stopped (12/25/18 1427)   PRN Meds: sodium chloride, acetaminophen, calcium carbonate, ondansetron (ZOFRAN) IV, sodium chloride flush   Vital Signs    Vitals:   12/25/18 1121 12/25/18 1930 12/26/18 0341 12/26/18 0811  BP: (!) 158/89 129/63 (!) 150/65 (!) 156/73  Pulse: 62 (!) 58 (!) 49 69  Resp: 20  18 18   Temp: 98.2 F (36.8 C) 98.7 F (37.1 C) 98.4 F (36.9 C) 99 F (37.2 C)  TempSrc: Oral Oral Oral Oral  SpO2: 97% 96% 98% 95%  Weight:   77.2 kg   Height:        Intake/Output Summary (Last 24 hours) at 12/26/2018 0851 Last data filed at 12/26/2018 0827 Gross per 24 hour  Intake 1901.56 ml  Output 1400 ml  Net 501.56 ml   Filed Weights   12/24/18 0458 12/25/18 0551 12/26/18 0341  Weight: 77.5 kg 77.4 kg 77.2 kg    Physical Exam   GEN: Well nourished, well developed, in no acute distress.  Neck: Supple, no JVD, carotid bruits, or masses. Cardiac: RRR, no murmurs, rubs, or gallops. No clubbing, cyanosis, edema.  Radials/DP/PT 1+ and  equal bilaterally.  Respiratory:  Respirations regular and unlabored, clear to auscultation bilaterally. Skin: warm and dry, no rash. Neuro:  Strength and sensation are intact. Psych: AAOx3.  Normal affect.  Labs    Chemistry Recent Labs  Lab 12/24/18 0525 12/25/18 1117 12/26/18 0331  NA 141 139 140  K 2.9* 3.8 4.8  CL 88* 91* 92*  CO2 40* 36* 37*  GLUCOSE 130* 154* 268*  BUN 51* 51* 52*  CREATININE 2.14* 2.28* 2.17*  CALCIUM 9.9 9.9 9.9  GFRNONAA 22* 20* 22*  GFRAA 26* 24* 25*  ANIONGAP 13 12 11      Hematology Recent Labs  Lab 12/19/18 1621 12/23/18 0444 12/26/18 0331  WBC 7.4 6.0 6.9  RBC 5.30* 5.10 4.97  HGB 12.9 12.4 12.1  HCT 43.5 41.3 40.3  MCV 82.1 81.0 81.1  MCH 24.3* 24.3* 24.3*  MCHC 29.7* 30.0 30.0  RDW 19.9* 18.6* 18.2*  PLT 205 210 200    Cardiac Enzymes Recent Labs  Lab 12/21/18 1021 12/21/18 1350  TROPONINI 0.10* 0.08*   No results for input(s): TROPIPOC in the last 168 hours.   BNP Recent Labs  Lab 12/19/18 1621 12/23/18 0444  BNP 1,637.0* 1,655.2*  DDimer No results for input(s): DDIMER in the last 168 hours.   Radiology    No results found.  Telemetry    060/03/2019 NSR, some episodes of SB  - Personally Reviewed  ECG    12/21/2018 AF with HR 79- Personally Reviewed  Cardiac Studies   Echocardiogram 12/20/2018: 1. The left ventricle has normal systolic function with an ejection fraction of 60-65%. The cavity size was mildly dilated. Mild basal septal hyperteophy. Left ventricular diastolic function could not be evaluated due to nondiagnostic images. No  evidence of left ventricular regional wall motion abnormalities. 2. The right ventricle has normal systolic function. The cavity was mildly enlarged. There is no increase in right ventricular wall thickness. Right ventricular systolic pressure is moderately elevated with an estimated pressure of 44.1 mmHg. 3. Left atrial size was severely dilated. 4. Right atrial  size was moderately dilated. 5. Stable AV bioprosthesis that appears to be functioning normally. The mean AV gradient is 108mmHg which is down from 29mmHg a year ago. There is no perivalvular AI. 6. The mitral valve is degenerative. Mild thickening of the anterior mitral valve leaflet. Mild calcification of the anterior mitral valve leaflet. There is moderate to severe mitral annular calcification present. 7. The inferior vena cava was dilated in size with <50% respiratory variability. 8. Compared to last echo the PASP has decreased from 60mmHg to 55mmHg.  Patient Profile     73 y.o. female with a hx of CAD s/p PCI of the RCAin 2003(occluded by cath 03/2013, normal nuc 11/2017), atrial fibrillation on coumadin,severe AS s/p AVR with pericardial tissue valve and MAZE procedure in 2014, HTN, chronic diastolic CHF, HLD,PAD followed by Dr. 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 She was seen in office yesterday with volume overload, hypoxia (acute hypoxic respiratory failure), 25lb weight gain and felt to require IV diuresis. UE duplex neg for DVT. 2D echo 12/20/18 EF 60-65%, mildly enlarged RV with moderate pulm HTN (decreased PASP from prior), severe LAE, stable AV prosthesis, calcification of mitral valve. Concern noted for salt and fluid restriction adherence.  Assessment & Plan    1. Acute on chronic CHF: -Continue IV Lasix 40mg  BID>>needs transition to PO>>pt noted to be on Torsemide 100mg  twice daily at home   -Weight, 170lb today>>down from 184lb on presentation  -I&O, net negative 9.8L since hopstial admission  -Dry weight noted to be in the low 160lb range>> 170lb today   -Restart home torsemide?   2. ESRD: -s/p renal transplant with CKD stage III at baseline  -Creatinine today, 2.17 -Per IM  3. Paroxysmal AF: -HR stable -Continue lopressor 12.5 -Continue Coumadin per pharmacy  4.  Essential HTN: -Stable, 156/73, 150/65, 129/63 -No lisinopril in the setting of renal disease  -Continue lopressor>>will restart home dose amlodipine 5>>consider increasing in the OP setting if remains elevated  -HR 45-65 range>>asymptomtic   5. Bioprosthetic AVR: -Stable per recent echocardiogram with a mean gradient of 22mmHg   6. CAD s/p PCI to the RCA 2003: -No anginal symptoms -Mild trop elevation>>not consistent with ACS -Continue BB, statin  -No ASA in the setting of chronic Coumadin   Signed, Kathyrn Drown NP-C [place] Pager: [phone] 12/26/2018, 8:51 AM     For questions or updates, please contact   Please consult [email or web address] for contact info under Cardiology/STEMI.  The patient was seen, examined and discussed with Kathyrn Drown, NP  and I agree with the above.  The patient is still mildly fluid overloaded but feels better than baseline and wishes to go home, She walks without difficulties. I would switch her back to torsemide PO 100 mg BID and add spironolactone 25 mg po daily as she is hypertensive and has hypokalemia, I would discontinue KCl supplements. I would discharge home today and we will arrange for a follow up in the clinic in 1 week with labs - BMP and BNP.   Ena Dawley, MD 12/26/2018

## 2018-12-26 NOTE — Telephone Encounter (Signed)
error 

## 2018-12-26 NOTE — Care Management Important Message (Signed)
Important Message  Patient Details  Name: [name] MRN: [id number] Date of Birth: 08-Mar-1946   Medicare Important Message Given:  Yes    [name] 12/26/2018, 1:23 PM

## 2018-12-26 NOTE — Progress Notes (Signed)
Patient was discharged home.  Discharge packet with medication education given with teach back. Patient's questions and concerns were answered. Peripheral iv removed, clean dry and intact, pressure and dressing applied. Patient's belongings in bag: clothes, shoes, cell phone, wallet/purse and glasses. Patient was transported in wheelchair by nurse tech to [place] entrance where pt's husband will be wating.

## 2018-12-26 NOTE — Telephone Encounter (Signed)
TOC appt in office with Fabian Sharp on 01/05/19 @ 9:15am per Kathyrn Drown

## 2018-12-26 NOTE — Telephone Encounter (Signed)

## 2018-12-26 NOTE — Telephone Encounter (Signed)
Patient still in hospital.

## 2018-12-27 ENCOUNTER — Other Ambulatory Visit: Payer: Self-pay | Admitting: *Deleted

## 2018-12-27 ENCOUNTER — Telehealth: Payer: Self-pay | Admitting: *Deleted

## 2018-12-27 NOTE — Telephone Encounter (Signed)
Patient was on TCM list for today. Admit date: 12/19/18 Discharge date: 12/26/18  Patient was admitted with a PMH of CAD status post PCI to the RCA, atrial fibrillation on Coumadin, aortic valve stenosis status post AVR, and stage III CKD with history of renal transplantation 2012, IDDM, and chronic diastolic CHF who was admitted on 12/19/2018 for evaluation of progressive shortness of breath associated with worsening peripheral edema in the setting of reduction of her diuretic dose dating back to January. At 20+ pound weight gain and despite increasing her torsemide to 100 mg twice daily for the past few weeks, she has not had any relief.In theED, she was noted to be hypoxic with a room air saturation of 86%. Chest x-ray showed cardiomegaly with moderate vascular congestion and interstitial edema. EKG showed sinus rhythm with T wave inversions that were not new.  She was discharged and advised to  f/u with cardiology in 1 week and is already scheduled with PCP on 12/28/18.

## 2018-12-27 NOTE — Patient Outreach (Signed)
[facility]) Care Management  12/27/2018  Hamda Klutts [birth year] [id number]   Referral received: 12/26/2018 Initial Outreach: 12/27/2018 Transition of care to be completed by the primary provider office   Telephone Assessment  RN spoke with pt today and verified identifiers. RN introduced [facility] and inquired if this was a good time to talk. Pt indicated she was washing her hair and requested a call back tomorrow @2 :00 PM. RN will follow up tomorrow as requested to further engage on possible needs post discharged from the hospital.   RN will follow up an scheduled a call from tomorrow as requested.  Raina Mina, RN Care Management Coordinator [place] Office [phone]

## 2018-12-28 ENCOUNTER — Ambulatory Visit: Payer: Medicare Other | Admitting: Internal Medicine

## 2018-12-28 ENCOUNTER — Encounter: Payer: Self-pay | Admitting: *Deleted

## 2018-12-28 ENCOUNTER — Other Ambulatory Visit: Payer: Self-pay | Admitting: *Deleted

## 2018-12-28 NOTE — Patient Outreach (Signed)
[facility]) Care Management  12/28/2018  Lakendra Helling 09-07-1945 [id number]   Referral received: 12/26/2018 Initial Outreach: 12/28/2018 Transition of care to be completed by primary office  Telephone Assessment  RN spoke with pt today and verified identifiers. RN introduced [facility] services and the purpose for today's call. RN inquired if this was a good time to talk (pt receptive). RN discussed pt's medical issues and focused on pt's HF. RN further educated pt on the HF zones and verified pt remains in the GREEN zone with no acute symptoms. Pt able to verify her weights over the last few days at 158 lbs yesterday and 160 lbs today. Denies any swelling or symptoms discussed today as pt states she is managing this condition and weighing daily but has not documented all her readings. RN strongly encouraged pt to document all readings and will sent [facility] calendar for documenting all her readings. RN offered to also send printed material for [facility] education on her HF (pt receptive). Will also reviewed all medication and educated accordingly. Medical appointments verified and RN strongly encouraged pt to attend to avoid rescheduling due to her recent discharged. All other medical condition pt indicates are managed well with no acute symptoms of issues.  Plan of care discussed and generated with goals and interventions discussed with pt in agreeance to what has been discussed. Will verify no other issues at this time. Will continue to follow up with ongoing case management services and contact pt in a few weeks. Note initial assessment completed accordingly based upon what pt was able to answer today. Will completed any other information needed accordingly. Will update primary care provider on pt's disposition with [facility] services.   THN CM Care Plan Problem One     Most Recent Value  Care Plan Problem One  Deficient Knowledge related to CHF symptom management  Role Documenting the Problem One   Care Management Coordinator  Care Plan for Problem One  Active  THN Long Term Goal   Pt will verbalized two symptoms of CHF exacerbation and what to do within the next 90 days.  THN Long Term Goal Start Date  12/28/18  Interventions for Problem One Long Term Goal  Will educate pt on the HF zones and verify pt is in the GREEN zone today. Will discuss in detail all zones and what to do if acute symptoms should occur.  THN CM Short Term Goal #1   Adherence with daily weights with in the next 30 days  THN CM Short Term Goal #1 Start Date  12/28/18  Interventions for Short Term Goal #1  Will stress the importance of daily weights and encourage to weight daily. Will again stress the improtance of avoid fluid retentions with HF managment of care.  THN CM Short Term Goal #2   Adherence with post-op medications with in the next 30 days.  THN CM Short Term Goal #2 Start Date  12/28/18  Interventions for Short Term Goal #2  Reviewed and educated on all discharged medications. Strongly encouraged adherence with administration and if there are any discrepancies or side effect to contact her provider's office immediately. RN will verify pt has all medications and all medication match her discharge summary that again has been reviewed.      Raina Mina, RN Care Management Coordinator [place] Office [phone]

## 2018-12-28 NOTE — Telephone Encounter (Signed)
Left message for pt to call.

## 2018-12-29 ENCOUNTER — Telehealth: Payer: Self-pay | Admitting: Cardiology

## 2018-12-29 NOTE — Telephone Encounter (Signed)
Left message for pt to call.

## 2018-12-29 NOTE — Telephone Encounter (Signed)
New message:     Patient calling stating that some one called her. I did not see a note. Please call.

## 2018-12-29 NOTE — Telephone Encounter (Signed)
Called patient, advised I did not see any notes- it could have been an automatic call regarding appointment for coumadin clinic on the 16th.  Patient verbalized understanding.

## 2019-01-02 ENCOUNTER — Ambulatory Visit: Payer: Medicare Other | Admitting: *Deleted

## 2019-01-02 ENCOUNTER — Ambulatory Visit (INDEPENDENT_AMBULATORY_CARE_PROVIDER_SITE_OTHER): Payer: Medicare Other | Admitting: Pharmacist Clinician (PhC)/ Clinical Pharmacy Specialist

## 2019-01-02 DIAGNOSIS — Z5181 Encounter for therapeutic drug level monitoring: Secondary | ICD-10-CM | POA: Diagnosis not present

## 2019-01-02 DIAGNOSIS — I5033 Acute on chronic diastolic (congestive) heart failure: Secondary | ICD-10-CM | POA: Diagnosis not present

## 2019-01-02 DIAGNOSIS — I480 Paroxysmal atrial fibrillation: Secondary | ICD-10-CM

## 2019-01-02 DIAGNOSIS — I4891 Unspecified atrial fibrillation: Secondary | ICD-10-CM | POA: Diagnosis not present

## 2019-01-02 LAB — POCT INR: INR: 2 (ref 2.0–3.0)

## 2019-01-02 NOTE — Progress Notes (Signed)
Cardiology Office Note:    Date:  01/05/2019   ID:  [name], DOB 03-08-1946, MRN [id number]  PCP:  [name], MD  Cardiologist:  [name], MD   Referring MD: [name], MD   Chief Complaint  Patient presents with  . Follow-up  recent hospitalization for acute on chronic diastolic heart failure  History of Present Illness:    [name] is a 73 y.o. female with a hx of CAD status post PCI of RCA in 2003, occluded by catheter in 2014, normal nuclear stress test in 11/2017, paroxysmal atrial fibrillation on Coumadin, severe AS status post AVR with pericardial tissue valve and maze procedure in 2014, hypertension, chronic diastolic heart failure, hyperlipidemia, PAD followed by Dr. Fletcher Anon, ESRD status post renal transplant in 2012 with AV fistula in place, embolic CVA and adenocarcinoma of the lung in 11/2017 status post radiation.  She was recently admitted 12/2018 for acute on chronic diastolic heart failure.  She had a 25 pound weight gain and required IV diuresis.  Upper extremity duplex was negative for DVT.  Repeat echocardiogram with preserved EF of 60 to 65%, mildly enlarged RV with moderate pulmonary hypertension but decreased PASP from prior, severe LAE, stable AV prosthesis, calcification of mitral valve.  She was discharged on 12/26/2018.  She was on torsemide 100 mg twice daily at home.  Her weight at discharge was 170 pounds, down from 184 pounds on presentation.  Dry weight is thought to be 160 pounds.  Patient controlled with Lopressor and amlodipine. Spironolactone was added for better pressure control in the setting of hypokalemia.  Lisinopril was held on discharge and no potassium supplements in the setting of renal transplant and ESRD.  She was discharged on her home torsemide 100 mg twice daily with new spironolactone 25 mg daily.  Potassium supplements were discontinued.    She presents today for follow-up.  Labs drawn 01/02/19. Renal function is  stable, if not slightly improved from discharge. K 4.5. According to notes, lisinopril was D/C'ed, but her discharge paperwork has her taking it - she has been taking it since discharge. Overall, she feels great. She has many questions about her insulin regimen. She is taking the max she was instructed and still not controlled. I asked her to talk with her PCP. Her children recently visited who are vegetarian. She states the food they cooked for her was delicious and her neuropathy was relieved while eating a vegetarian diet. We further discussed diet. She denies orthopnea and swelling is much improved. She is on home oxygen.   Past Medical History:  Diagnosis Date  . Acute CHF (congestive heart failure) ([place]) 12/2018  . Adenocarcinoma of lung, stage 1, right ([place]) 11/25/2017  . Anemia, iron deficiency    of chronic disease  . Aortic stenosis    a. Severe AS by echo 11/2012.  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 Asystole ([place])    a. During ENT surgery 2005: developed marked asystole requiring CPR, felt due to vagal reaction (cath nonobst dz).  . Carotid artery disease ([place])    a. Carotid Dopplers performed in August 2013 showed 40-59% left stenosis and 0-39% right; f/u recommended in 2 years.   . Cerebrovascular accident [facility]) 2009   a. LMCA infarct felt embolic 3220, maintained on chronic coumadin.; denies residual on 04/05/2013  . Cholelithiasis   . Chronic Persistent Atrial Fibrillation 12/31/2008   Qualifier: Diagnosis of  By: [name]    .  Coronary artery disease 05/2002   a. Ant MI 2003 s/p PTCA/stent to RCA.   . Diverticulosis of colon   . Esophagitis, reflux   . ESRD (end stage renal disease) ([place])    a. Mass on L kidney per pt s/p nephrectomy - pt states not cancer - [facility] notes indicate ESRD due to HTN/DM - was previously on HD. b. Kidney transplant 02/2011.  Marland Kitchen GERD (gastroesophageal reflux disease)   . Gout   . Helicobacter pylori (H. pylori) infection     hx of  . Hemorrhoids   . Hx of colonic polyps    adenomatous  . Hyperlipidemia   . Hypertension   . Lung nodule seen on imaging study 04/07/2013   1.0 cm ground glass opacity RUL  . Myocardial infarction ([place]) 2003  . Pericardial effusion    a. Small by echo 11/2011.  . S/P aortic valve replacement with bioprosthetic valve and maze procedure 04/12/2013   60mm [facility] Ease bovine pericardial tissue valve   . S/P Maze operation for atrial fibrillation 04/12/2013   Complete bilateral atrial lesion set using bipolar radiofrequency and cryothermy ablation with clipping of LA appendage  . Sleep apnea    Pt says testing was positive, intolerant of CPAP.  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  . Type II diabetes mellitus ([place])     Past Surgical History:  Procedure Laterality Date  . AORTIC VALVE REPLACEMENT N/A 04/12/2013   Procedure: AORTIC VALVE REPLACEMENT (AVR);  Surgeon: Rexene Alberts, MD;  Location: [place];  Service: Open Heart Surgery;  Laterality: N/A;  . ARTERIOVENOUS GRAFT PLACEMENT Left   . ARTERIOVENOUS GRAFT PLACEMENT Left    "I've had 2 on my left; had one removed" (04/05/2013)   . ARTERY EXPLORATION Right 04/11/2013   Procedure: ARTERY EXPLORATION;  Surgeon: Rexene Alberts, MD;  Location: [place];  Service: Open Heart Surgery;  Laterality: Right;  Right carotid artery exploration  . AV FISTULA PLACEMENT Right   . AV FISTULA REPAIR Right    "took it out" ((/18/2014)  . CARDIOVERSION  05/29/2012   Procedure: CARDIOVERSION;  Surgeon: Lelon Perla, MD;  Location: [facility] ENDOSCOPY;  Service: Cardiovascular;  Laterality: N/A;  . CHOLECYSTECTOMY  2009   with hernia removal  . CORONARY ANGIOPLASTY WITH STENT PLACEMENT Right    coronary artery  . INSERTION OF DIALYSIS CATHETER Bilateral    "over the years; took  them both out" (04/05/2013)  . INTRAOPERATIVE TRANSESOPHAGEAL ECHOCARDIOGRAM N/A 04/11/2013   Procedure: INTRAOPERATIVE TRANSESOPHAGEAL ECHOCARDIOGRAM;  Surgeon: Rexene Alberts, MD;  Location: [place];  Service: Open Heart Surgery;  Laterality: N/A;  . INTRAOPERATIVE TRANSESOPHAGEAL ECHOCARDIOGRAM N/A 04/12/2013   Procedure: INTRAOPERATIVE TRANSESOPHAGEAL ECHOCARDIOGRAM;  Surgeon: Rexene Alberts, MD;  Location: [place];  Service: Open Heart Surgery;  Laterality: N/A;  . KIDNEY TRANSPLANT  03/16/11  . LEFT AND RIGHT HEART CATHETERIZATION WITH CORONARY ANGIOGRAM N/A 04/06/2013   Procedure: LEFT AND RIGHT HEART CATHETERIZATION WITH CORONARY ANGIOGRAM;  Surgeon: Blane Ohara, MD;  Location: [facility] CATH LAB;  Service: Cardiovascular;  Laterality: N/A;  . MAZE N/A 04/12/2013   Procedure: MAZE;  Surgeon: Rexene Alberts, MD;  Location: [place];  Service: Open Heart Surgery;  Laterality: N/A;  . NASAL RECONSTRUCTION WITH SEPTAL REPAIR     "took it out" (04/05/2013)  . NEPHRECTOMY Left 2010  no CA on bx  . TONSILLECTOMY    . TOTAL ABDOMINAL HYSTERECTOMY    . TUBAL LIGATION      Current Medications: Current Meds  Medication Sig  . acetaminophen (TYLENOL) 500 MG tablet Take 500 mg by mouth every 6 (six) hours as needed for mild pain or headache.  Marland Kitchen amLODipine (NORVASC) 10 MG tablet Take 0.5 tablets (5 mg total) by mouth daily. (Patient taking differently: Take 5 mg by mouth every Monday, Wednesday, and Friday. )  . dapsone 25 MG tablet Take 25 mg by mouth daily.   Marland Kitchen gabapentin (NEURONTIN) 100 MG capsule Take 100 mg by mouth 2 (two) times daily.   . Insulin Glargine, 1 Unit Dial, (TOUJEO SOLOSTAR) 300 UNIT/ML SOPN Inject 24 Units into the skin every morning. Titrate up by 1 unit a day for goal sugars of 100-130 up to 50 units a day (Patient taking differently: Inject 0-50 Units into the skin See admin instructions. Inject 50 units into the skin in the morning if BGL is 130 or greater; inject 0 unit(s) if BGL is  100 or less)  . Insulin Pen Needle (B-D UF III MINI PEN NEEDLES) 31G X 5 MM MISC USE TO ADMINISTER INSULIN FOUR TIMES A DAY DX E11.9  . levothyroxine (SYNTHROID) 25 MCG tablet TAKE 2 TABLETS (50 MCG TOTAL) BY MOUTH DAILY. (Patient taking differently: Take 50 mcg by mouth daily before breakfast. )  . lisinopril (PRINIVIL,ZESTRIL) 5 MG tablet Take 5 mg by mouth at bedtime.   . metoprolol tartrate (LOPRESSOR) 25 MG tablet TAKE 0.5 TABLETS (12.5 MG TOTAL) BY MOUTH 2 (TWO) TIMES DAILY. TAKE 1 TABLET BY MOUTH TWICE A DAY (Patient taking differently: Take 12.5 mg by mouth 2 (two) times daily. )  . Multiple Vitamins-Minerals (CENTRUM SILVER 50+WOMEN) TABS Take 1 tablet by mouth daily with breakfast.  . NON FORMULARY Apply 1 application topically See admin instructions. Melaleuca Extra Strength pain cream: Apply to painful sites as needed  . NON FORMULARY Apply 1 application topically See admin instructions. Hempvana Pain Relief Cream: Apply to painful sites as needed  . ONETOUCH DELICA LANCETS 95M MISC Use to check blood sugars three times a day DX E11.9  . ONETOUCH VERIO test strip USE TO TEST 3 TIMES DAILY. DX E11.9 (Patient taking differently: 1 each by Other route 3 (three) times daily. DX: E11.9)  . OXYGEN Inhale 3 L into the lungs as needed (for shortness of breath).   . predniSONE (DELTASONE) 5 MG tablet Take 5 mg by mouth daily.   Marland Kitchen Propylene Glycol (SYSTANE BALANCE) 0.6 % SOLN Place 1-2 drops into both eyes 3 (three) times daily as needed (for dryness).  . rosuvastatin (CRESTOR) 20 MG tablet Take 1 tablet (20 mg total) by mouth daily.  Marland Kitchen spironolactone (ALDACTONE) 25 MG tablet Take 1 tablet (25 mg total) by mouth daily.  . tacrolimus (PROGRAF) 1 MG capsule Take 3 mg by mouth 2 (two) times daily.   Marland Kitchen torsemide (DEMADEX) 100 MG tablet Take 1 tablet (100 mg total) by mouth 2 (two) times a day.  . warfarin (COUMADIN) 5 MG tablet Take 1/2 to 1 tablet by mouth daily as directed (Patient taking  differently: Take 5 mg by mouth at bedtime. )  . [DISCONTINUED] rosuvastatin (CRESTOR) 20 MG tablet Take 1 tablet (20 mg total) by mouth daily.  . [DISCONTINUED] torsemide (DEMADEX) 100 MG tablet Take 100 mg by mouth 2 (two) times a day.     Allergies:   Ibuprofen, Sulfamethoxazole-trimethoprim,  Sulfonamide derivatives, Tape, Tramadol, Doxycycline, Hydrocil [psyllium], Bactrim, and Red dye   Social History   Socioeconomic History  . Marital status: Married    Spouse name: Not on file  . Number of children: 5  . Years of education: 35  . Highest education level: Not on file  Occupational History  . Occupation: retired    Fish farm manager: RETIRED  Social Needs  . Financial resource strain: Not on file  . Food insecurity    Worry: Not on file    Inability: Not on file  . Transportation needs    Medical: Not on file    Non-medical: Not on file  Tobacco Use  . Smoking status: Former Smoker    Packs/day: 1.00    Years: 30.00    Pack years: 30.00    Types: Cigarettes    Quit date: 07/19/2001    Years since quitting: 17.4  . Smokeless tobacco: Never Used  Substance and Sexual Activity  . Alcohol use: No  . Drug use: No  . Sexual activity: Not Currently  Lifestyle  . Physical activity    Days per week: Not on file    Minutes per session: Not on file  . Stress: Not on file  Relationships  . Social Herbalist on phone: Not on file    Gets together: Not on file    Attends religious service: Not on file    Active member of club or organization: Not on file    Attends meetings of clubs or organizations: Not on file    Relationship status: Not on file  Other Topics Concern  . Not on file  Social History Narrative   Patient signed a Designated Party Release to allow her spouse [name] and family and five children to have access to her medical records/information.     Patient lives with husband in a 2 story home.  Has 5 children. Retired from Personal assistant.  Education: 3 years  of college.      Family History: The patient's family history includes Diabetes in her maternal grandmother, son, and another family member; Hypertension in her mother; Stroke in her father. There is no history of Breast cancer.  ROS:   Please see the history of present illness.     All other systems reviewed and are negative.  EKGs/Labs/Other Studies Reviewed:    The following studies were reviewed today:  Echo 12/20/18: 1. The left ventricle has normal systolic function with an ejection fraction of 60-65%. The cavity size was mildly dilated. Mild basal septal hyperteophy. Left ventricular diastolic function could not be evaluated due to nondiagnostic images. No  evidence of left ventricular regional wall motion abnormalities.  2. The right ventricle has normal systolic function. The cavity was mildly enlarged. There is no increase in right ventricular wall thickness. Right ventricular systolic pressure is moderately elevated with an estimated pressure of 44.1 mmHg.  3. Left atrial size was severely dilated.  4. Right atrial size was moderately dilated.  5. Stable AV bioprosthesis that appears to be functioning normally. The mean AV gradient is 86mmHg which is down from 60mmHg a year ago. There is no perivalvular AI.  6. The mitral valve is degenerative. Mild thickening of the anterior mitral valve leaflet. Mild calcification of the anterior mitral valve leaflet. There is moderate to severe mitral annular calcification present.  7. The inferior vena cava was dilated in size with <50% respiratory variability.  8. Compared to last echo the PASP  has decreased from 29mmHg to 89mmHg.   EKG:  EKG is not ordered today.   Recent Labs: 12/08/2018: ALT 15 12/19/2018: TSH 1.645 12/23/2018: B Natriuretic Peptide 1,655.2 12/26/2018: Hemoglobin 12.1; Magnesium 2.0; Platelets 200 01/02/2019: BUN 46; Creatinine, Ser 1.96; Potassium 4.5; Sodium 136  Recent Lipid Panel    Component Value Date/Time   CHOL  155 03/24/2018 1510   TRIG 91 03/24/2018 1510   TRIG 121 06/13/2006 1525   HDL 64 03/24/2018 1510   CHOLHDL 2.4 03/24/2018 1510   CHOLHDL 2 11/18/2016 1354   VLDL 26.2 11/18/2016 1354   LDLCALC 73 03/24/2018 1510   LDLDIRECT 89.3 06/13/2006 1525    Physical Exam:    VS:  BP 130/70   Pulse 83   Temp (!) 97.2 F (36.2 C)   Ht 5\' 1"  (1.549 m)   Wt 168 lb 9.6 oz (76.5 kg)   BMI 31.86 kg/m     Wt Readings from Last 3 Encounters:  01/05/19 168 lb 9.6 oz (76.5 kg)  12/26/18 170 lb 3.2 oz (77.2 kg)  12/19/18 188 lb 6.4 oz (85.5 kg)     GEN: Well nourished, well developed in no acute distress HEENT: Normal NECK: mild JVD; No carotid bruits CARDIAC: irregular rhythm, regular rate,  no murmurs, rubs, gallops RESPIRATORY:  Clear to auscultation without rales, wheezing or rhonchi  ABDOMEN: Soft, non-tender, non-distended MUSCULOSKELETAL:  trace edema; No deformity  SKIN: Warm and dry NEUROLOGIC:  Alert and oriented x 3, some speech slur and stutter PSYCHIATRIC:  Normal affect   ASSESSMENT:    1. Chronic diastolic heart failure (HCC)   2. AF (paroxysmal atrial fibrillation) ([place])   3. Essential hypertension   4. Hyperlipidemia LDL goal <70   5. Coronary artery disease involving native coronary artery of native heart without angina pectoris   6. S/P AVR (aortic valve replacement)    PLAN:    In order of problems listed above:  Chronic diastolic heart failure Currently on torsemide 100 BID and spironolactone, no K supplementation Weight today is 168 here, she is 162 lbs at home. Dry weight thought to be 160 lbs. She is euvolemic today. I will send a note to Dr. [name] concerning lisinopril. If he recommends to D/C it, will double her amlodipine for pressure. Her renal function was stable, if not slightly improved from discharge on lisinopril.    Paroxysmal atrial fibrillation Status post maze in 2014 On coumadin, no bleeding issues. This patients CHA2DS2-VASc Score and  unadjusted Ischemic Stroke Rate (% per year) is equal to 9.7 % stroke rate/year from a score of 6 (CAD, HTN, DM, MI, age, female)   Hypertension 5 mg norvasc - increase if D/C ACEI Lisinopril 5 mg - will consult Dr. [name] Lopressor 12.5 mg BID Spironolactone 25 mg Pressures have been 149F or less systolic at home.    Hyperlipidemia 03/24/2018: Cholesterol, Total 155; HDL 64; LDL Calculated 73; Triglycerides 91 - continue statin   CAD status post PCI to RCA in 2003 No chest pain. No ASA with coumadin.    Follow up with Dr. [name] in 3 months.    Medication Adjustments/Labs and Tests Ordered: Current medicines are reviewed at length with the patient today.  Concerns regarding medicines are outlined above.  No orders of the defined types were placed in this encounter.  Meds ordered this encounter  Medications  . torsemide (DEMADEX) 100 MG tablet    Sig: Take 1 tablet (100 mg total) by mouth 2 (two) times a  day.    Dispense:  60 tablet    Refill:  6  . rosuvastatin (CRESTOR) 20 MG tablet    Sig: Take 1 tablet (20 mg total) by mouth daily.    Dispense:  30 tablet    Refill:  6    Signed, Ledora Bottcher, [place]  01/05/2019 10:26 AM    [facility]

## 2019-01-03 ENCOUNTER — Encounter: Payer: Self-pay | Admitting: *Deleted

## 2019-01-03 LAB — BASIC METABOLIC PANEL
BUN/Creatinine Ratio: 23 (ref 12–28)
BUN: 46 mg/dL — ABNORMAL HIGH (ref 8–27)
CO2: 29 mmol/L (ref 20–29)
Calcium: 9.9 mg/dL (ref 8.7–10.3)
Chloride: 95 mmol/L — ABNORMAL LOW (ref 96–106)
Creatinine, Ser: 1.96 mg/dL — ABNORMAL HIGH (ref 0.57–1.00)
GFR calc Af Amer: 28 mL/min/{1.73_m2} — ABNORMAL LOW (ref >59)
GFR calc non Af Amer: 25 mL/min/{1.73_m2} — ABNORMAL LOW (ref >59)
Glucose: 350 mg/dL — ABNORMAL HIGH (ref 65–99)
Potassium: 4.5 mmol/L (ref 3.5–5.2)
Sodium: 136 mmol/L (ref 134–144)

## 2019-01-05 ENCOUNTER — Ambulatory Visit (INDEPENDENT_AMBULATORY_CARE_PROVIDER_SITE_OTHER): Payer: Medicare Other | Admitting: Physician Assistant

## 2019-01-05 ENCOUNTER — Encounter: Payer: Self-pay | Admitting: Physician Assistant

## 2019-01-05 ENCOUNTER — Other Ambulatory Visit: Payer: Self-pay

## 2019-01-05 VITALS — BP 130/70 | HR 83 | Temp 97.2°F | Ht 61.0 in | Wt 168.6 lb

## 2019-01-05 DIAGNOSIS — I5032 Chronic diastolic (congestive) heart failure: Secondary | ICD-10-CM

## 2019-01-05 DIAGNOSIS — I480 Paroxysmal atrial fibrillation: Secondary | ICD-10-CM | POA: Diagnosis not present

## 2019-01-05 DIAGNOSIS — Z952 Presence of prosthetic heart valve: Secondary | ICD-10-CM | POA: Diagnosis not present

## 2019-01-05 DIAGNOSIS — I10 Essential (primary) hypertension: Secondary | ICD-10-CM | POA: Diagnosis not present

## 2019-01-05 DIAGNOSIS — I2510 Atherosclerotic heart disease of native coronary artery without angina pectoris: Secondary | ICD-10-CM | POA: Diagnosis not present

## 2019-01-05 DIAGNOSIS — E785 Hyperlipidemia, unspecified: Secondary | ICD-10-CM | POA: Diagnosis not present

## 2019-01-05 MED ORDER — TORSEMIDE 100 MG PO TABS: 100.0000 mg | ORAL_TABLET | Freq: Two times a day (BID) | ORAL | 6 refills | Status: DC

## 2019-01-05 MED ORDER — ROSUVASTATIN CALCIUM 20 MG PO TABS: 20.0000 mg | ORAL_TABLET | Freq: Every day | ORAL | 6 refills | Status: DC

## 2019-01-05 NOTE — Patient Instructions (Signed)
Medication Instructions:  Your physician recommends that you continue on your current medications as directed. Please refer to the Current Medication list given to you today.  If you need a refill on your cardiac medications before your next appointment, please call your pharmacy.    Follow-Up: At [facility], you and your health needs are our priority.  As part of our continuing mission to provide you with exceptional heart care, we have created designated Provider Care Teams.  These Care Teams include your primary Cardiologist (physician) and Advanced Practice Providers (APPs -  Physician Assistants and Nurse Practitioners) who all work together to provide you with the care you need, when you need it. . You have been scheduled for a follow-up visit with Dr. [name] on Tuesday, 04/03/19 at 9:00 AM.  Any Other Special Instructions Will Be Listed Below (If Applicable). Please contact your primary doctor about your insulin.

## 2019-01-08 ENCOUNTER — Ambulatory Visit (INDEPENDENT_AMBULATORY_CARE_PROVIDER_SITE_OTHER): Payer: Medicare Other | Admitting: Internal Medicine

## 2019-01-08 ENCOUNTER — Encounter: Payer: Self-pay | Admitting: Internal Medicine

## 2019-01-08 DIAGNOSIS — I480 Paroxysmal atrial fibrillation: Secondary | ICD-10-CM

## 2019-01-08 DIAGNOSIS — E1121 Type 2 diabetes mellitus with diabetic nephropathy: Secondary | ICD-10-CM | POA: Diagnosis not present

## 2019-01-08 DIAGNOSIS — N183 Chronic kidney disease, stage 3 unspecified: Secondary | ICD-10-CM

## 2019-01-08 DIAGNOSIS — Z794 Long term (current) use of insulin: Secondary | ICD-10-CM

## 2019-01-08 DIAGNOSIS — I2510 Atherosclerotic heart disease of native coronary artery without angina pectoris: Secondary | ICD-10-CM | POA: Diagnosis not present

## 2019-01-08 DIAGNOSIS — J9611 Chronic respiratory failure with hypoxia: Secondary | ICD-10-CM

## 2019-01-08 DIAGNOSIS — R609 Edema, unspecified: Secondary | ICD-10-CM | POA: Diagnosis not present

## 2019-01-08 MED ORDER — ROSUVASTATIN CALCIUM 20 MG PO TABS: 20.0000 mg | ORAL_TABLET | Freq: Every day | ORAL | 3 refills | Status: DC

## 2019-01-08 MED ORDER — TOUJEO SOLOSTAR 300 UNIT/ML ~~LOC~~ SOPN: 24.0000 [IU] | PEN_INJECTOR | Freq: Every morning | SUBCUTANEOUS | 11 refills | Status: DC

## 2019-01-08 NOTE — Assessment & Plan Note (Signed)
Coumadin 

## 2019-01-08 NOTE — Assessment & Plan Note (Signed)
On O2 

## 2019-01-08 NOTE — Assessment & Plan Note (Signed)
Torsemide, spironolactone D/c Lisinopril

## 2019-01-08 NOTE — Assessment & Plan Note (Signed)
D/c Lisinopril. Creat>2

## 2019-01-08 NOTE — Assessment & Plan Note (Signed)
We discussed Toujeo use

## 2019-01-08 NOTE — Progress Notes (Signed)
Virtual Visit via Video Note  I connected with [name] on 01/08/19 at  2:40 PM EDT by a video enabled telemedicine application and verified that I am speaking with the correct person using two identifiers.   I discussed the limitations of evaluation and management by telemedicine and the availability of in person appointments. The patient expressed understanding and agreed to proceed.  History of Present Illness: We need to follow-up on CHF, CRF, HTN f/u she was hospitalized for CHF. She lost >20 lbs water  There has been no runny nose,, chest pain, abdominal pain, diarrhea, constipation, , skin rashes.   Observations/Objective: The patient appears to be in no acute distress, looks well.  Assessment and Plan:  See my Assessment and Plan. Follow Up Instructions:    I discussed the assessment and treatment plan with the patient. The patient was provided an opportunity to ask questions and all were answered. The patient agreed with the plan and demonstrated an understanding of the instructions.   The patient was advised to call back or seek an in-person evaluation if the symptoms worsen or if the condition fails to improve as anticipated.  I provided face-to-face time during this encounter. We were at different locations.   Walker Kehr, MD

## 2019-01-09 ENCOUNTER — Telehealth: Payer: Self-pay | Admitting: Physician Assistant

## 2019-01-09 NOTE — Telephone Encounter (Signed)
Dr. Alain Marion d/c'd lisinopril via recent telemedicine visit. Will call pt to let her know Dr. [name] recommendation and ask if she has seen her nephrologist.

## 2019-01-09 NOTE — Telephone Encounter (Signed)
Lovena Le, please make sure she has followed up with nephrology. Please let her know that Dr. [name] recommends continuing lisinopril for now, but she needs to see nephrology.  Thanks Angie

## 2019-01-09 NOTE — Telephone Encounter (Signed)
-----   Message from Lelon Perla, MD sent at 01/05/2019 11:16 AM EDT ----- If renal function stable would continue and have her follow-up with nephrology. [name] ----- Message ----- From: Ledora Bottcher, PA Sent: 01/05/2019  10:26 AM EDT To: Lelon Perla, MD  Dr. [name], notes from last hospitalization mention D/C lisinopril in the setting of ESRD s/p renal transplant. Discharge paperwork has continued ACEI and she is still taking it. She said she was verbally told to stop taking it, but followed her discharge paperwork instead. Her follow up BMP was stable. Do you recommend stopping ACEI? If so, I'll double norvasc for BP control.

## 2019-01-16 ENCOUNTER — Telehealth: Payer: Self-pay

## 2019-01-16 NOTE — Telephone Encounter (Signed)
Unable to lmom for prescreen  

## 2019-01-26 ENCOUNTER — Other Ambulatory Visit: Payer: Self-pay | Admitting: *Deleted

## 2019-01-26 ENCOUNTER — Telehealth: Payer: Self-pay | Admitting: *Deleted

## 2019-01-26 ENCOUNTER — Encounter: Payer: Self-pay | Admitting: *Deleted

## 2019-01-26 NOTE — Telephone Encounter (Signed)
Spoke with pt at request of [facility], patient reports her weight at discharge was 168 and now she is down to 149. She is feeling weak and has reduced the dose to once daily. Okay was given for the once daily dosing with attention to weight. Patient voiced understanding to call if her weight continues to go down and also to call if her weight gets to 168 or over. I She reports she had blood in her urine while in the hospital and they determined it was related to her urinating so much. She denies any sign of a UTI. Will have the [facility] clinic contact the patient to schedule follow up testing.

## 2019-01-26 NOTE — Telephone Encounter (Signed)
This encounter was created in error - please disregard.

## 2019-01-26 NOTE — Telephone Encounter (Addendum)
Pt missed INR check on 7/7, tried to call to reschedule but phone kept ringing, unable to leave voicemail.

## 2019-01-26 NOTE — Patient Outreach (Signed)
[place] [facility]) Care Management  01/26/2019  [name] 10-May-1946 [id number]  Telephone Assessment-HF management  RN spoke with pt today and inquired on her ongoing management of care. Pt reports she is doing well and losing weight now at 149 lbs over the past few days. States she is having symptoms of weakness lately (provider is not aware). Pt states due to her symptoms she is only taking one of her Torsemide daily (scheduled for twice daily). Pt also states she is having blood in her urine. Pt has attempted to contact her provider's office to make an appointment for her Coumadin check however has not been successful. RN strongly encouraged pt not to change any of her medications that are prescribed without discussing changes with her provider unless her symptoms are acute or allergic. RN offered to notify her provider with the above information (receptive). Dr. [name] made aware and his RN will contact pt later today with possible interventions for the following(appointment for Coumadin clinic, recent change in her Torsemide and blood in her urine).   Plan of care review and updated based upon pt's HF and ongoing management of care related to HF symptoms and zone verified that pt remains in the GREEN zone with noted issues reported to her provider. All goals and interventions addressed with adjustments made accordingly based upon pt's progress.Verified pt has received the printed EMMI information mailed related to HF. Will encouraged pt to review for ongoing knowledge based related to her disease process. No other issue or inquire at this time as RN informed pt that [name], RN from her CAD office would be contacting her before the end of this day.   [facility] CM Care Plan Problem One     Most Recent Value  Care Plan Problem One  Deficient Knowledge related to CHF symptom management  Role Documenting the Problem One  Care Management Coordinator  Care Plan for Problem One  Active   [facility] Long Term Goal   Pt will verbalized two symptoms of CHF exacerbation and what to do within the next 90 days.  [facility] Long Term Goal Start Date  12/28/18  Interventions for Problem One Long Term Goal  Will extend to allow adherence as pt continue to manage her HF. Pt able to denies any precipitating symptoms and aware of fluid retention. Other symptoms reviewd for ongoing education and RN inquired on printed material received related to HF education. RN strongly encouraged pt to review for future increase in knowledge base. Will re-evaluate in few weeks concerning pt's progress.  [facility] CM Short Term Goal #1   Adherence with daily weights with in the next 30 days  [facility] CM Short Term Goal #1 Start Date  12/28/18  Interventions for Short Term Goal #1  Will continue to encouraged ongoing weights and extend this goal due to pt's constant weight loss with use of her diuretics. Will reiterated on 3 lbs loss overnight and 5 lbs loss over one week to report to her provider for possible interventions. Will verify pt's weights related to her HF.  [facility] CM Short Term Goal #2   Adherence with post-op medications with in the next 30 days.  [facility] CM Short Term Goal #2 Start Date  12/28/18  Interventions for Short Term Goal #2  Will strongly encouraged pt not to change any of her medications with notifying her provider and if symptoms are not acute or allergic. Will offer to assist pt with notifying her CAD provider of the recent changes  she has made with reducing her diuretic to once daily instead of twice daily. Will continue to follow up on possible changes based upon pt's symptoms reported of weakness. No other changes in her medications reported today.      Raina Mina, RN Care Management Coordinator [place] Office [phone]

## 2019-01-29 NOTE — Telephone Encounter (Signed)
Follow Up  Patient calling back in to discuss her weight change and is still feeling weak. She is requesting a call back to discuss. Appointment has been scheduled for 02/06/19.

## 2019-01-30 NOTE — Telephone Encounter (Signed)
Left message for pt to call.

## 2019-01-31 ENCOUNTER — Other Ambulatory Visit: Payer: Self-pay | Admitting: Internal Medicine

## 2019-02-06 ENCOUNTER — Other Ambulatory Visit: Payer: Self-pay

## 2019-02-06 ENCOUNTER — Other Ambulatory Visit: Payer: Self-pay | Admitting: Internal Medicine

## 2019-02-06 ENCOUNTER — Ambulatory Visit (INDEPENDENT_AMBULATORY_CARE_PROVIDER_SITE_OTHER): Payer: Medicare Other | Admitting: Pharmacist Clinician (PhC)/ Clinical Pharmacy Specialist

## 2019-02-06 DIAGNOSIS — Z5181 Encounter for therapeutic drug level monitoring: Secondary | ICD-10-CM | POA: Diagnosis not present

## 2019-02-06 DIAGNOSIS — I480 Paroxysmal atrial fibrillation: Secondary | ICD-10-CM | POA: Diagnosis not present

## 2019-02-06 LAB — POCT INR: INR: 2.5 (ref 2.0–3.0)

## 2019-02-06 MED ORDER — AMOXICILLIN 500 MG PO CAPS: ORAL_CAPSULE | ORAL | 1 refills | Status: DC

## 2019-02-21 DIAGNOSIS — E119 Type 2 diabetes mellitus without complications: Secondary | ICD-10-CM | POA: Diagnosis not present

## 2019-02-21 DIAGNOSIS — N2581 Secondary hyperparathyroidism of renal origin: Secondary | ICD-10-CM | POA: Diagnosis not present

## 2019-02-21 DIAGNOSIS — Z940 Kidney transplant status: Secondary | ICD-10-CM | POA: Diagnosis not present

## 2019-02-23 DIAGNOSIS — N184 Chronic kidney disease, stage 4 (severe): Secondary | ICD-10-CM | POA: Diagnosis not present

## 2019-02-26 ENCOUNTER — Other Ambulatory Visit: Payer: Self-pay | Admitting: *Deleted

## 2019-02-26 DIAGNOSIS — D899 Disorder involving the immune mechanism, unspecified: Secondary | ICD-10-CM | POA: Diagnosis not present

## 2019-02-26 DIAGNOSIS — R809 Proteinuria, unspecified: Secondary | ICD-10-CM | POA: Diagnosis not present

## 2019-02-26 DIAGNOSIS — N179 Acute kidney failure, unspecified: Secondary | ICD-10-CM | POA: Diagnosis not present

## 2019-02-26 DIAGNOSIS — Z940 Kidney transplant status: Secondary | ICD-10-CM | POA: Diagnosis not present

## 2019-02-26 DIAGNOSIS — N189 Chronic kidney disease, unspecified: Secondary | ICD-10-CM | POA: Diagnosis not present

## 2019-02-26 DIAGNOSIS — E119 Type 2 diabetes mellitus without complications: Secondary | ICD-10-CM | POA: Diagnosis not present

## 2019-02-26 DIAGNOSIS — I10 Essential (primary) hypertension: Secondary | ICD-10-CM | POA: Diagnosis not present

## 2019-02-26 NOTE — Patient Outreach (Signed)
[place] [facility]) Care Management  02/26/2019  [name] Aug 14, 1945 [id number]    Telephone Assessment  RN spoke briefly with pt today who indicated she was on her way to a doctor's appointment and requested a call back tomorrow around 2:00 PM.  RN will follow up tomorrow as requested and update the plan of care and ongoing case management needs at that time.   Raina Mina, RN Care Management Coordinator [place] Office [phone]

## 2019-02-27 ENCOUNTER — Other Ambulatory Visit: Payer: Self-pay | Admitting: *Deleted

## 2019-02-27 NOTE — Patient Outreach (Signed)
[place] [facility]) Care Management  02/27/2019  Angelita Harnack 1945-08-22 [id number]  Telephone Assessment   RN spoke briefly with pt's spouse who indicated pt with out driver around with his children today. Caregiver spouse requested RN to call back tomorrow around 2 PM.  RN will follow up tomorrow as requested and obtain an update on pt's ongoing management of care.   Raina Mina, RN Care Management Coordinator [place] Office [phone]

## 2019-02-28 ENCOUNTER — Other Ambulatory Visit: Payer: Self-pay | Admitting: *Deleted

## 2019-02-28 NOTE — Patient Outreach (Signed)
[facility] ([facility]) Care Management  02/28/2019  [name] 07/18/1946 [id number]    Telephone Assessment-HF  RN received a call back from pt with an update on her ongoing management of care. Plan of care discussed as pt continues to weigh and records her weight daily with today's weight at 148 lbs has she is losing some weight. Denies any issues with her HF and remains in the GREEN zone. Pt denies any acute symptoms with no swelling or fluid retention. Ongoing education continues as pt verify receival of the HF packet sent. Reviewed the printed material and answered any questions and/or inquires related. Continue to discussed the current plan of care with some goals met and others with adjusted interventions based upon pt's ongoing progress. Pt remains confident in managing her ongoing care with her HF and reports other vitals that are normal. Along with home O2 with good saturations.   Will update the goals of care and follow up next month accordingly with pt's progress concerning HF management.  [facility] CM Care Plan Problem One     Most Recent Value  Care Plan Problem One  Deficient Knowledge related to CHF symptom management  Role Documenting the Problem One  Care Management Coordinator  Care Plan for Problem One  Active  [facility] Long Term Goal   Pt will verbalized two symptoms of CHF exacerbation and what to do within the next 90 days.  [facility] Long Term Goal Start Date  12/28/18  Interventions for Problem One Long Term Goal  Will extend to allow pt time to review the printed material mailed via [facility]. Will also continue to recite such symptoms and explained once again the importance of early intervention to prevent acute symptoms from excerbating. Will re-evaluate next month pt's adherence to increasing her knowledge base related to HF.   [facility] CM Short Term Goal #1   Adherence with daily weights with in the next 30 days  [facility] CM Short Term Goal #1 Start Date  12/28/18  [facility] CM Short Term  Goal #1 Met Date  02/28/19  [facility] CM Short Term Goal #2   Adherence with post-op medications with in the next 30 days.  [facility] CM Short Term Goal #2 Start Date  12/28/18  [facility] CM Short Term Goal #2 Met Date  02/28/19       , RN Care Management Coordinator [facility] Main Office [phone] 

## 2019-02-28 NOTE — Patient Outreach (Signed)
[facility]) Care Management  02/28/2019  Rylinn Linzy 1946-07-09 [id number]    Telephone Assessment-Unsuccessful  RN spoke with pt's spouse who indicates pt was not available. Spouse to inform pt to return the call to RN case manager however if not RN will scheduled another follow up call on Friday afternoon.   Will update plan of care and discuss pt's ongoing management of care at that time.  Raina Mina, RN Care Management Coordinator [place] Office [phone]

## 2019-03-02 ENCOUNTER — Ambulatory Visit: Payer: Medicare Other | Admitting: *Deleted

## 2019-03-05 DIAGNOSIS — N179 Acute kidney failure, unspecified: Secondary | ICD-10-CM | POA: Diagnosis not present

## 2019-03-13 ENCOUNTER — Ambulatory Visit: Payer: Medicare Other

## 2019-03-13 ENCOUNTER — Ambulatory Visit
Admission: RE | Admit: 2019-03-13 | Discharge: 2019-03-13 | Disposition: A | Discharge status: Home / Self Care | Payer: Medicare Other | Source: Ambulatory Visit | Attending: Internal Medicine | Admitting: Internal Medicine

## 2019-03-13 DIAGNOSIS — N644 Mastodynia: Secondary | ICD-10-CM

## 2019-03-13 DIAGNOSIS — R928 Other abnormal and inconclusive findings on diagnostic imaging of breast: Secondary | ICD-10-CM | POA: Diagnosis not present

## 2019-03-14 ENCOUNTER — Encounter: Payer: Self-pay | Admitting: Internal Medicine

## 2019-03-14 ENCOUNTER — Other Ambulatory Visit: Payer: Self-pay

## 2019-03-14 ENCOUNTER — Ambulatory Visit (INDEPENDENT_AMBULATORY_CARE_PROVIDER_SITE_OTHER): Payer: Medicare Other | Admitting: Internal Medicine

## 2019-03-14 DIAGNOSIS — I480 Paroxysmal atrial fibrillation: Secondary | ICD-10-CM

## 2019-03-14 DIAGNOSIS — E669 Obesity, unspecified: Secondary | ICD-10-CM

## 2019-03-14 DIAGNOSIS — I151 Hypertension secondary to other renal disorders: Secondary | ICD-10-CM

## 2019-03-14 DIAGNOSIS — M5440 Lumbago with sciatica, unspecified side: Secondary | ICD-10-CM | POA: Diagnosis not present

## 2019-03-14 DIAGNOSIS — Z794 Long term (current) use of insulin: Secondary | ICD-10-CM

## 2019-03-14 DIAGNOSIS — I2510 Atherosclerotic heart disease of native coronary artery without angina pectoris: Secondary | ICD-10-CM | POA: Diagnosis not present

## 2019-03-14 DIAGNOSIS — G8929 Other chronic pain: Secondary | ICD-10-CM | POA: Diagnosis not present

## 2019-03-14 DIAGNOSIS — E1121 Type 2 diabetes mellitus with diabetic nephropathy: Secondary | ICD-10-CM | POA: Diagnosis not present

## 2019-03-14 DIAGNOSIS — Z940 Kidney transplant status: Secondary | ICD-10-CM

## 2019-03-14 MED ORDER — TOUJEO SOLOSTAR 300 UNIT/ML ~~LOC~~ SOPN: 24.0000 [IU] | PEN_INJECTOR | Freq: Every morning | SUBCUTANEOUS | 11 refills | Status: DC

## 2019-03-14 NOTE — Patient Instructions (Signed)
If you have medicare related insurance (such as traditional Medicare, Blue Cross Medicare, United HealthCare Medicare, or similar), Please make an appointment at the scheduling desk with [name], the Wellness Health Coach, for your Wellness visit in this office, which is a benefit with your insurance.  

## 2019-03-14 NOTE — Assessment & Plan Note (Signed)
The pt lost wt

## 2019-03-14 NOTE — Progress Notes (Signed)
Subjective:  [name], female    DOB: 04-Feb-1946  Age: 73 y.o. MRN: [id number]  CC: No chief complaint on file.   HPI [name] presents for LE neuropathy, CHF in June - lost 35 lbs (water), AF. Labs w/Dr Joelyn Oms last week Cr 1.8  Per d/c summary:  "Principal Problem:   Acute on chronic diastolic CHF (congestive heart failure) (HCC) Active Problems:   Type 2 diabetes mellitus with renal manifestations (HCC)   AF (paroxysmal atrial fibrillation) (HCC)   Constipation   Long term (current) use of anticoagulants   Renal transplant recipient   S/P aortic valve replacement with bioprosthetic valve and maze procedure   Left upper extremity swelling   CKD (chronic kidney disease), stage III (HCC)   Hypokalemia   Immunosuppression (HCC)   Acute respiratory failure with hypoxia (HCC)   Obesity (BMI 30-39.9)   Discharge Condition: Improved.  Diet recommendation: Low sodium, heart healthy.  Carbohydrate-modified.   Wound care: None.  Code status: Full.   History of Present Illness:   [name] an 73 y.o.femalewith a PMH of CAD status post PCI to the RCA, atrial fibrillation on Coumadin, aortic valve stenosis status post AVR, and stage III CKD with history of renal transplantation 2012, IDDM, and chronic diastolic CHF who was admitted on 12/19/2018 for evaluation of progressive shortness of breath associated with worsening peripheral edema in the setting of reduction of her diuretic dose dating back to January. At 20+ pound weight gain and despite increasing her torsemide to 100 mg twice daily for the past few weeks, she has not had any relief.In theED, she was noted to be hypoxic with a room air saturation of 86%. Chest x-ray showed cardiomegaly with moderate vascular congestion and interstitial edema. EKG showed sinus rhythm with T wave inversions that were not new.   Hospital Course:  Patient was admitted to the hospital and following conditions  were addressed during hospitalization,  Acute on chronic diastolic CHF with acute on chronic hypoxic respiratory failure Patient was seen by cardiology during hospitalization and was on IV diuretic regimen.  Patient had gradual improvement in her symptoms including significant diuresis and weight loss.  She does have baseline oxygen requirement at home intermittently.  At this time, patient has been reassessed by cardiology and has been considered stable for disposition home.  She will continue on torsemide but spironolactone has been added to the regimen.  Hypokalemia Improved during hospitalization.  Patient will be on spironolactone on discharge.  Will need to follow-up BMP after discharge.  Left upper extremity swelling -Previous AV fistula in place. Left upper extremity venous duplex ultrasound negative for DVT or superficial thrombosis.Improved  Paroxysmal atrial Fibrillation/status post aortic valve replacement with bioprosthetic valve and maze procedure Patient had normal sinus rhythm during hospitalization.  She will be continued on metoprolol and Coumadin on discharge.  Renal transplant recipient/immunosuppression/CKD stage III The patient is followed by Dr. Joelyn Oms of nephrology and Dr. Roxy Horseman at [facility]. Creatinine on admission 1.89 with baseline creatinine varying from 1.2-2.25. Continue immunosuppressive therapy with prednisone, Prograf, and tacrolimus.   Follow renal function closely after discharge.  Patient was advised to follow-up with her nephrologist as outpatient.  Diabetes mellitus with nephropathy.   Insulin regimen to be continued on discharge. Marland Kitchen  CAD status post PCI Patient with a history of PCI to RCA in 2014.  She had no acute issues during hospitalization.  Continue statins beta-blockers.   Hypothyroidism -Continue Synthroid.TSH within normal limits during hospitalization.Marland Kitchen  Essential hypertension Continue metoprolol and  amlodipine. Spironolactone has been added.  Will hold her lisinopril on discharge.  Obesity Body mass index is 32.28 kg/m.  Constipation Improved. ContinueMiraLAX  Disposition.  At this time, patient has been seen by cardiology.  She is stable for disposition home.  I also spoke with the patient's husband on the phone and updated him about the changes in the medications and the need for outpatient follow-up with cardiology."   Outpatient Medications Prior to Visit  Medication Sig Dispense Refill  . acetaminophen (TYLENOL) 500 MG tablet Take 500 mg by mouth every 6 (six) hours as needed for mild pain or headache.    Marland Kitchen amLODipine (NORVASC) 10 MG tablet Take 0.5 tablets (5 mg total) by mouth daily. (Patient taking differently: Take 5 mg by mouth every Monday, Wednesday, and Friday. ) 90 tablet 3  . amoxicillin (AMOXIL) 500 MG capsule Take 4 tablets 1 hour prior to dental work 20 capsule 1  . dapsone 25 MG tablet Take 25 mg by mouth daily.     Marland Kitchen gabapentin (NEURONTIN) 100 MG capsule Take 100 mg by mouth 2 (two) times daily. Pt taking differently as needed    . Insulin Pen Needle (B-D UF III MINI PEN NEEDLES) 31G X 5 MM MISC USE TO ADMINISTER INSULIN FOUR TIMES A DAY DX E11.9 100 each 1  . levothyroxine (SYNTHROID) 25 MCG tablet TAKE 2 TABLETS (50 MCG TOTAL) BY MOUTH DAILY. (Patient taking differently: Take 50 mcg by mouth daily before breakfast. ) 180 tablet 3  . metoprolol tartrate (LOPRESSOR) 25 MG tablet TAKE 0.5 TABLETS (12.5 MG TOTAL) BY MOUTH 2 (TWO) TIMES DAILY. TAKE 1 TABLET BY MOUTH TWICE A DAY (Patient taking differently: Take 12.5 mg by mouth 2 (two) times daily. ) 180 tablet 3  . Multiple Vitamins-Minerals (CENTRUM SILVER 50+WOMEN) TABS Take 1 tablet by mouth daily with breakfast.    . NON FORMULARY Apply 1 application topically See admin instructions. Melaleuca Extra Strength pain cream: Apply to painful sites as needed    . NON FORMULARY Apply 1 application topically See  admin instructions. Hempvana Pain Relief Cream: Apply to painful sites as needed    . ONETOUCH DELICA LANCETS 84X MISC Use to check blood sugars three times a day DX E11.9 100 each 5  . ONETOUCH VERIO test strip USE TO TEST 3 TIMES DAILY. DX E11.9 (Patient taking differently: 1 each by Other route 3 (three) times daily. DX: E11.9) 100 each 5  . OXYGEN Inhale 3 L into the lungs as needed (for shortness of breath).     . predniSONE (DELTASONE) 5 MG tablet Take 5 mg by mouth daily.   5  . Propylene Glycol (SYSTANE BALANCE) 0.6 % SOLN Place 1-2 drops into both eyes 3 (three) times daily as needed (for dryness).    . rosuvastatin (CRESTOR) 20 MG tablet Take 1 tablet (20 mg total) by mouth daily. 90 tablet 3  . spironolactone (ALDACTONE) 25 MG tablet Take 1 tablet (25 mg total) by mouth daily. 60 tablet 2  . tacrolimus (PROGRAF) 1 MG capsule Take 3 mg by mouth 2 (two) times daily.     Marland Kitchen warfarin (COUMADIN) 5 MG tablet Take 1/2 to 1 tablet by mouth daily as directed (Patient taking differently: Take 5 mg by mouth at bedtime. ) 90 tablet 1  . Insulin Glargine, 1 Unit Dial, (TOUJEO SOLOSTAR) 300 UNIT/ML SOPN Inject 24 Units into the skin every morning. Titrate up by 1 unit a day  if needed for goal sugars of 100-130 up to 30 units a day max 4 pen 11  . torsemide (DEMADEX) 100 MG tablet Take 1 tablet (100 mg total) by mouth 2 (two) times a day. (Patient not taking: Reported on 03/14/2019) 60 tablet 6   No facility-administered medications prior to visit.     ROS: Review of Systems  Constitutional: Positive for fatigue. Negative for activity change, appetite change, chills and unexpected weight change.  HENT: Negative for congestion, mouth sores and sinus pressure.   Eyes: Negative for visual disturbance.  Respiratory: Negative for cough and chest tightness.   Gastrointestinal: Negative for abdominal pain and nausea.  Genitourinary: Negative for difficulty urinating, frequency and vaginal pain.   Musculoskeletal: Positive for arthralgias and gait problem. Negative for back pain.  Skin: Negative for pallor and rash.  Neurological: Negative for dizziness, tremors, weakness, numbness and headaches.  Psychiatric/Behavioral: Negative for confusion, sleep disturbance and suicidal ideas.    Objective:  BP (!) 180/76 (BP Location: Left Arm, Patient Position: Sitting, Cuff Size: Normal)   Pulse (!) 52   Temp 98.3 F (36.8 C) (Oral)   Ht 5\' 1"  (1.549 m)   Wt 153 lb (69.4 kg)   SpO2 93%   BMI 28.91 kg/m   BP Readings from Last 3 Encounters:  03/14/19 (!) 180/76  01/05/19 130/70  12/26/18 (!) 141/79    Wt Readings from Last 3 Encounters:  03/14/19 153 lb (69.4 kg)  01/05/19 168 lb 9.6 oz (76.5 kg)  12/26/18 170 lb 3.2 oz (77.2 kg)    Physical Exam Constitutional:      General: She is not in acute distress.    Appearance: She is well-developed.  HENT:     Head: Normocephalic.     Right Ear: External ear normal.     Left Ear: External ear normal.     Nose: Nose normal.  Eyes:     General:        Right eye: No discharge.        Left eye: No discharge.     Conjunctiva/sclera: Conjunctivae normal.     Pupils: Pupils are equal, round, and reactive to light.  Neck:     Musculoskeletal: Normal range of motion and neck supple.     Thyroid: No thyromegaly.     Vascular: No JVD.     Trachea: No tracheal deviation.  Cardiovascular:     Rate and Rhythm: Normal rate and regular rhythm.     Heart sounds: Normal heart sounds.  Pulmonary:     Effort: No respiratory distress.     Breath sounds: No stridor. No wheezing.  Abdominal:     General: Bowel sounds are normal. There is no distension.     Palpations: Abdomen is soft. There is no mass.     Tenderness: There is no abdominal tenderness. There is no guarding or rebound.  Musculoskeletal:        General: Tenderness present.  Lymphadenopathy:     Cervical: No cervical adenopathy.  Skin:    Findings: No erythema or rash.   Neurological:     Cranial Nerves: No cranial nerve deficit.     Motor: No abnormal muscle tone.     Coordination: Coordination normal.     Gait: Gait abnormal.     Deep Tendon Reflexes: Reflexes normal.  Psychiatric:        Behavior: Behavior normal.        Thought Content: Thought content normal.  Judgment: Judgment normal.    On O2 3 l   Lab Results  Component Value Date   WBC 6.9 12/26/2018   HGB 12.1 12/26/2018   HCT 40.3 12/26/2018   PLT 200 12/26/2018   GLUCOSE 350 (H) 01/02/2019   CHOL 155 03/24/2018   TRIG 91 03/24/2018   HDL 64 03/24/2018   LDLDIRECT 89.3 06/13/2006   LDLCALC 73 03/24/2018   ALT 15 12/08/2018   AST 23 12/08/2018   NA 136 01/02/2019   K 4.5 01/02/2019   CL 95 (L) 01/02/2019   CREATININE 1.96 (H) 01/02/2019   BUN 46 (H) 01/02/2019   CO2 29 01/02/2019   TSH 1.645 12/19/2018   INR 2.5 02/06/2019   HGBA1C 7.9 (H) 12/19/2018   MICROALBUR 317.3 (H) 07/02/2016    Mm Diag Breast Tomo Bilateral  Result Date: 03/13/2019 CLINICAL DATA:  73 year old female who presented in May of 2020 with bilateral breast heaviness and pain. Three-month follow-up was recommended following resolution of her fluid overload. EXAM: DIGITAL DIAGNOSTIC BILATERAL MAMMOGRAM WITH CAD AND TOMO COMPARISON:  Previous exam(s). ACR Breast Density Category b: There are scattered areas of fibroglandular density. FINDINGS: No suspicious calcifications, masses or areas of distortion are seen in the bilateral breasts. The marked bilateral skin thickening and trabecular thickening has resolved. Mammographic images were processed with CAD. IMPRESSION: 1. Resolution of the bilateral skin and trabecular thickening consistent with history of resolved fluid overload. 2.  No mammographic evidence of malignancy in the bilateral breasts. RECOMMENDATION: Screening mammogram in one year.(Code:[id number]-B-01Y) I have discussed the findings and recommendations with the patient. Results were also provided in  writing at the conclusion of the visit. If applicable, a reminder letter will be sent to the patient regarding the next appointment. BI-RADS CATEGORY  1: Negative. Electronically Signed   By: Ammie Ferrier M.D.   On: 03/13/2019 15:20    Assessment & Plan:   There are no diagnoses linked to this encounter.   Meds ordered this encounter  Medications  . Insulin Glargine, 1 Unit Dial, (TOUJEO SOLOSTAR) 300 UNIT/ML SOPN    Sig: Inject 24 Units into the skin every morning. Titrate up by 1 unit a day if needed for goal sugars of 100-130 up to 30 units a day max    Dispense:  3 pen    Refill:  11     Follow-up: No follow-ups on file.  Walker Kehr, MD

## 2019-03-14 NOTE — Assessment & Plan Note (Signed)
Oxycodone prn - 5/17 change to Percocet so that she could break tab in 1/4 or 1/2's  Potential benefits of a long term oxycodone use as well as potential risks (i.e. addiction risk (low), apnea etc) and complications (i.e. Somnolence, seizures, constipation and others) were explained to the patient and were aknowledged.

## 2019-03-14 NOTE — Assessment & Plan Note (Signed)
  BP nl at home

## 2019-03-14 NOTE — Assessment & Plan Note (Signed)
On Coumadin 

## 2019-03-14 NOTE — Assessment & Plan Note (Signed)
Toujeo

## 2019-03-15 ENCOUNTER — Other Ambulatory Visit: Payer: Self-pay | Admitting: *Deleted

## 2019-03-15 NOTE — Patient Outreach (Signed)
[place] [facility]) Care Management  03/15/2019  [name] Jul 08, 1946 [id number]    Telephone Assessment-post d/c 8/26 [place]  RN attempted outreach call today however unsuccessful. RN able to leave a HIPAA approved voice message requesting a call back.  Will follow up within the next 4 business days for ongoing case management services.  Raina Mina, RN Care Management Coordinator [place] Office [phone]

## 2019-03-20 ENCOUNTER — Other Ambulatory Visit: Payer: Self-pay | Admitting: *Deleted

## 2019-03-20 NOTE — Patient Outreach (Signed)
[name]) Care Management  03/20/2019  [name] 12-29-1945 [id number]   Telephone Assessment-Heart Failure  RN spoke with pt and received an update as pt reports recent visit to her provider's office on 8/26 and required O2 applied. Pt went home with her SOB resolved. Indicated it was related to anxiety. Pt reports she is weighing 151 lbs over the last 6 days with no swelling or related symptoms. RN reviewed the HF zones and verified pt remains in the GREEN zone. States she is aware when to reach out to her provider with related symptoms if encountered. Pt states she is documenting all her weights in the [facility] calendar sent along with reviewing the printed material that was discussed today.   Other issues presented today related to pt indicating she has ocassional "blood ting" in her urine (history back in July) but keeps forgetting to inform her provider because it resolves so quickly. Denies any pain, odor and states it does not occur with every urination but "comes and goes". RN discussed which provider to call with this information and pt indicated she would contact her kidney doctor (Dr. Joelyn Oms) today with this information for an intervention on what she should do now that this is occurring once again. Pt states it is not heavy bleeding only "pinkish in color". RN strongly encouraged pt to prepare for inquiries on duration, intensity, odor and when this occurs when she speaks with her provider.   Plan of care reviewed with updated and added goal based upon today's assessment. Interventions updated accordingly and primary provider updated accordingly. Will follow up on pt's actions related to contacting her provider for interventions related to the above issues with GU (blood in urine). Will scheduled next follow up within 30 days.  THN CM Care Plan Problem One     Most Recent Value  Care Plan Problem One  Deficient Knowledge related to CHF symptom management  Role  Documenting the Problem One  Care Management Coordinator  Care Plan for Problem One  Active  THN Long Term Goal   Pt will verbalized two symptoms of CHF exacerbation and what to do within the next 90 days.  THN Long Term Goal Start Date  12/28/18  Interventions for Problem One Long Term Goal  Will continue to reiterate on symptoms and verify pt remains in the GREEN with no acute symtpoms. Will continue to encouraged pt to review the printed material for ongoing awareness on what to do if acute symptoms should occurl. Will re-evaluate next call on pt's knowledge base.     THN CM Care Plan Problem Two     Most Recent Value  Care Plan Problem Two  GU issues related to history of "blood" found in urine  Role Documenting the Problem Two  Care Management [name] for Problem Two  Active  THN CM Short Term Goal #1   Pt will contact her provider for intervention within the next 2 weeks.  THN CM Short Term Goal #1 Start Date  03/20/19  Interventions for Short Term Goal #2   Strongly encourage pt to alert her provider concerning her ongoing history of ocassional blood in urine. Will verify any other symptoms and encouraged pt to report all findings as discussed today. Will follow up accordingly on any interventions on the next outreach.       Raina Mina, RN Care Management Coordinator [place] Office [phone]

## 2019-03-21 ENCOUNTER — Other Ambulatory Visit: Payer: Self-pay

## 2019-03-21 MED ORDER — ROSUVASTATIN CALCIUM 20 MG PO TABS: 20.0000 mg | ORAL_TABLET | Freq: Every day | ORAL | 3 refills | Status: AC

## 2019-03-29 ENCOUNTER — Other Ambulatory Visit: Payer: Self-pay | Admitting: Internal Medicine

## 2019-03-29 ENCOUNTER — Ambulatory Visit: Payer: Medicare Other | Admitting: *Deleted

## 2019-04-02 NOTE — Progress Notes (Signed)
Virtual Visit via Video Note changed to phone visit due to technical difficulties   This visit type was conducted due to national recommendations for restrictions regarding the COVID-19 Pandemic (e.g. social distancing) in an effort to limit this patient's exposure and mitigate transmission in our community.  Due to her co-morbid illnesses, this patient is at least at moderate risk for complications without adequate follow up.  This format is felt to be most appropriate for this patient at this time.  All issues noted in this document were discussed and addressed.  A limited physical exam was performed with this format.  Please refer to the patient's chart for her consent to telehealth for [facility].   Date:  04/03/2019   ID:  [name], DOB 1945/12/04, MRN [id number]  Patient Location:Home Provider Location: Home  PCP:  [name], MD  Cardiologist:  Dr [name]  Evaluation Performed:  Follow-Up Visit  Chief Complaint:  FU AVR and atrial fibrillation  History of Present Illness:    FU aortic stenosis s/p AVR and atrial fibrillation. Patient has a history of embolic CVA. She has a history of coronary artery disease, status post PCI of the right coronary artery. She underwent cardiac catheterization in September 2014 for severe AS. The right coronary was totally occluded but no other coronary disease noted.Patient had aortic valve replacement with pericardial tissue valve and also had Maze procedure. Carotid Dopplers September 2018 showed 1 to 39% bilateral stenosis. Abdominal ultrasound October 2018 showed no abdominal aortic aneurysm and no focal stenosis in the aorta or iliacs; abnormal ABIs; PVD followed by Dr Fletcher Anon. Nuclear study May 2019 showed ejection fraction 59% and normal perfusion.  Venous Dopplers July 2019 showed no DVT.  Patient has a known lung tumor but has declined resection.  She did receive radiation therapy.    Echocardiogram June 2020 showed ejection  fraction 60 to 65%, mild left ventricular enlargement, moderately elevated pulmonary pressure, biatrial enlargement, bioprosthetic aortic valve with mean gradient 20 mmHg.  Since she was last seenshe has chronic DOE; no CP or syncope; no pedal edema.  The patient does not have symptoms concerning for COVID-19 infection (fever, chills, cough, or new shortness of breath).    Past Medical History:  Diagnosis Date  . Acute CHF (congestive heart failure) ([place]) 12/2018  . Adenocarcinoma of lung, stage 1, right ([place]) 11/25/2017  . Anemia, iron deficiency    of chronic disease  . Aortic stenosis    a. Severe AS by echo 11/2012.  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 Asystole ([place])    a. During ENT surgery 2005: developed marked asystole requiring CPR, felt due to vagal reaction (cath nonobst dz).  . Carotid artery disease ([place])    a. Carotid Dopplers performed in August 2013 showed 40-59% left stenosis and 0-39% right; f/u recommended in 2 years.   . Cerebrovascular accident [name]) 2009   a. LMCA infarct felt embolic 5188, maintained on chronic coumadin.; denies residual on 04/05/2013  . Cholelithiasis   . Chronic Persistent Atrial Fibrillation 12/31/2008   Qualifier: Diagnosis of  By: [name]    . Coronary artery disease 05/2002   a. Ant MI 2003 s/p PTCA/stent to RCA.   . Diverticulosis of colon   . Esophagitis, reflux   . ESRD (end stage renal disease) ([place])    a. Mass on L kidney per pt s/p nephrectomy - pt states not cancer - [facility] notes indicate ESRD due to HTN/DM -  was previously on HD. b. Kidney transplant 02/2011.  Marland Kitchen GERD (gastroesophageal reflux disease)   . Gout   . Helicobacter pylori (H. pylori) infection    hx of  . Hemorrhoids   . Hx of colonic polyps    adenomatous  . Hyperlipidemia   . Hypertension   . Lung nodule seen on imaging study 04/07/2013   1.0 cm ground glass opacity RUL  . Myocardial infarction ([place]) 2003  . Pericardial effusion    a.  Small by echo 11/2011.  . S/P aortic valve replacement with bioprosthetic valve and maze procedure 04/12/2013   70mm [facility] Ease bovine pericardial tissue valve   . S/P Maze operation for atrial fibrillation 04/12/2013   Complete bilateral atrial lesion set using bipolar radiofrequency and cryothermy ablation with clipping of LA appendage  . Sleep apnea    Pt says testing was positive, intolerant of CPAP.  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  . Type II diabetes mellitus ([place])    Past Surgical History:  Procedure Laterality Date  . AORTIC VALVE REPLACEMENT N/A 04/12/2013   Procedure: AORTIC VALVE REPLACEMENT (AVR);  Surgeon: Rexene Alberts, MD;  Location: [place];  Service: Open Heart Surgery;  Laterality: N/A;  . ARTERIOVENOUS GRAFT PLACEMENT Left   . ARTERIOVENOUS GRAFT PLACEMENT Left    "I've had 2 on my left; had one removed" (04/05/2013)   . ARTERY EXPLORATION Right 04/11/2013   Procedure: ARTERY EXPLORATION;  Surgeon: Rexene Alberts, MD;  Location: [place];  Service: Open Heart Surgery;  Laterality: Right;  Right carotid artery exploration  . AV FISTULA PLACEMENT Right   . AV FISTULA REPAIR Right    "took it out" ((/18/2014)  . CARDIOVERSION  05/29/2012   Procedure: CARDIOVERSION;  Surgeon: Lelon Perla, MD;  Location: [facility] ENDOSCOPY;  Service: Cardiovascular;  Laterality: N/A;  . CHOLECYSTECTOMY  2009   with hernia removal  . CORONARY ANGIOPLASTY WITH STENT PLACEMENT Right    coronary artery  . INSERTION OF DIALYSIS CATHETER Bilateral    "over the years; took them both out" (04/05/2013)  . INTRAOPERATIVE TRANSESOPHAGEAL ECHOCARDIOGRAM N/A 04/11/2013   Procedure: INTRAOPERATIVE TRANSESOPHAGEAL ECHOCARDIOGRAM;  Surgeon: Rexene Alberts, MD;  Location: [place];  Service: Open Heart Surgery;  Laterality: N/A;  .  INTRAOPERATIVE TRANSESOPHAGEAL ECHOCARDIOGRAM N/A 04/12/2013   Procedure: INTRAOPERATIVE TRANSESOPHAGEAL ECHOCARDIOGRAM;  Surgeon: Rexene Alberts, MD;  Location: [place];  Service: Open Heart Surgery;  Laterality: N/A;  . KIDNEY TRANSPLANT  03/16/11  . LEFT AND RIGHT HEART CATHETERIZATION WITH CORONARY ANGIOGRAM N/A 04/06/2013   Procedure: LEFT AND RIGHT HEART CATHETERIZATION WITH CORONARY ANGIOGRAM;  Surgeon: Blane Ohara, MD;  Location: [facility] CATH LAB;  Service: Cardiovascular;  Laterality: N/A;  . MAZE N/A 04/12/2013   Procedure: MAZE;  Surgeon: Rexene Alberts, MD;  Location: [place];  Service: Open Heart Surgery;  Laterality: N/A;  . NASAL RECONSTRUCTION WITH SEPTAL REPAIR     "took it out" (04/05/2013)  . NEPHRECTOMY Left 2010   no CA on bx  . TONSILLECTOMY    . TOTAL ABDOMINAL HYSTERECTOMY    . TUBAL LIGATION       Current Meds  Medication Sig  . acetaminophen (TYLENOL) 500 MG tablet Take 500 mg by mouth every 6 (six) hours as needed for mild pain or headache.  Marland Kitchen amLODipine (NORVASC) 10 MG  tablet Take 0.5 tablets (5 mg total) by mouth daily. (Patient taking differently: Take 5 mg by mouth every Monday, Wednesday, and Friday. )  . amoxicillin (AMOXIL) 500 MG capsule TAKE 4 CAPSULES BY MOUTH 1 HOUR PRIOR TO DENTAL WORK  . dapsone 25 MG tablet Take 25 mg by mouth daily.   . Insulin Glargine, 1 Unit Dial, (TOUJEO SOLOSTAR) 300 UNIT/ML SOPN Inject 24 Units into the skin every morning. Titrate up by 1 unit a day if needed for goal sugars of 100-130 up to 30 units a day max  . Insulin Pen Needle (B-D UF III MINI PEN NEEDLES) 31G X 5 MM MISC USE TO ADMINISTER INSULIN FOUR TIMES A DAY DX E11.9  . levothyroxine (SYNTHROID) 25 MCG tablet TAKE 2 TABLETS (50 MCG TOTAL) BY MOUTH DAILY. (Patient taking differently: Take 50 mcg by mouth daily before breakfast. )  . metoprolol tartrate (LOPRESSOR) 25 MG tablet TAKE 0.5 TABLETS (12.5 MG TOTAL) BY MOUTH 2 (TWO) TIMES DAILY. TAKE 1 TABLET BY MOUTH TWICE A DAY  (Patient taking differently: Take 12.5 mg by mouth 2 (two) times daily. )  . Multiple Vitamins-Minerals (CENTRUM SILVER 50+WOMEN) TABS Take 1 tablet by mouth daily with breakfast.  . NON FORMULARY Apply 1 application topically See admin instructions. Melaleuca Extra Strength pain cream: Apply to painful sites as needed  . NON FORMULARY Apply 1 application topically See admin instructions. Hempvana Pain Relief Cream: Apply to painful sites as needed  . ONETOUCH DELICA LANCETS 66Z MISC Use to check blood sugars three times a day DX E11.9  . ONETOUCH VERIO test strip USE TO TEST 3 TIMES DAILY. DX E11.9 (Patient taking differently: 1 each by Other route 3 (three) times daily. DX: E11.9)  . OXYGEN Inhale 3 L into the lungs as needed (for shortness of breath).   . predniSONE (DELTASONE) 5 MG tablet Take 5 mg by mouth daily.   Marland Kitchen Propylene Glycol (SYSTANE BALANCE) 0.6 % SOLN Place 1-2 drops into both eyes 3 (three) times daily as needed (for dryness).  . rosuvastatin (CRESTOR) 20 MG tablet Take 1 tablet (20 mg total) by mouth daily.  Marland Kitchen spironolactone (ALDACTONE) 25 MG tablet Take 1 tablet (25 mg total) by mouth daily.  . tacrolimus (PROGRAF) 1 MG capsule Take 3 mg by mouth 2 (two) times daily.   Marland Kitchen warfarin (COUMADIN) 5 MG tablet Take 1/2 to 1 tablet by mouth daily as directed (Patient taking differently: Take 5 mg by mouth at bedtime. )     Allergies:   Ibuprofen, Sulfamethoxazole-trimethoprim, Sulfonamide derivatives, Tape, Tramadol, Doxycycline, Hydrocil [psyllium], Bactrim, and Red dye   Social History   Tobacco Use  . Smoking status: Former Smoker    Packs/day: 1.00    Years: 30.00    Pack years: 30.00    Types: Cigarettes    Quit date: 07/19/2001    Years since quitting: 17.7  . Smokeless tobacco: Never Used  Substance Use Topics  . Alcohol use: No  . Drug use: No     Family Hx: The patient's family history includes Diabetes in her maternal grandmother, son, and another family member;  Hypertension in her mother; Stroke in her father. There is no history of Breast cancer.  ROS:   Please see the history of present illness.    No Fever, chills  or productive cough; fatigue. All other systems reviewed and are negative.  Recent Labs: 12/08/2018: ALT 15 12/19/2018: TSH 1.645 12/23/2018: B Natriuretic Peptide 1,655.2 12/26/2018: Hemoglobin 12.1; Magnesium 2.0;  Platelets 200 01/02/2019: BUN 46; Creatinine, Ser 1.96; Potassium 4.5; Sodium 136   Recent Lipid Panel Lab Results  Component Value Date/Time   CHOL 155 03/24/2018 03:10 PM   TRIG 91 03/24/2018 03:10 PM   TRIG 121 06/13/2006 03:25 PM   HDL 64 03/24/2018 03:10 PM   CHOLHDL 2.4 03/24/2018 03:10 PM   CHOLHDL 2 11/18/2016 01:54 PM   LDLCALC 73 03/24/2018 03:10 PM   LDLDIRECT 89.3 06/13/2006 03:25 PM    Wt Readings from Last 3 Encounters:  04/03/19 151 lb (68.5 kg)  03/14/19 153 lb (69.4 kg)  01/05/19 168 lb 9.6 oz (76.5 kg)     Objective:    Vital Signs:  BP (!) 148/69   Ht 5\' 1"  (1.549 m)   Wt 151 lb (68.5 kg)   BMI 28.53 kg/m    VITAL SIGNS:  reviewed NAD Answers questions appropriately Normal affect Remainder of physical examination not performed (telehealth visit; coronavirus pandemic)  ASSESSMENT & PLAN:    1. Prior aortic valve replacement-continue SBE prophylaxis. 2. Hypertension-blood pressure controlled per pt with SBP 120-140.  Continue present medications and follow. 3. Coronary artery disease-continue statin.  No aspirin given need for anticoagulation.  She is not having chest pain. 4. Hyperlipidemia-continue statin. 5. Chronic diastolic congestive heart failure-patient appears to be euvolemic today based on history.  Continue spironolactone at present dose. 6. Peripheral vascular disease-continue statin; no ASA given need for coumadin. 7. Paroxysmal atrial fibrillation-continue Coumadin and beta-blocker. 8. Lung cancer-followed by oncology.  COVID-19 Education: The importance of social  distancing was discussed today.  Time:   Today, I have spent 18 minutes with the patient with telehealth technology discussing the above problems.     Medication Adjustments/Labs and Tests Ordered: Current medicines are reviewed at length with the patient today.  Concerns regarding medicines are outlined above.   Tests Ordered: No orders of the defined types were placed in this encounter.   Medication Changes: No orders of the defined types were placed in this encounter.   Follow Up:  Virtual Visit or In Person in 6 month(s)  Signed, [name], MD  04/03/2019 8:53 AM    [place]

## 2019-04-03 ENCOUNTER — Telehealth (INDEPENDENT_AMBULATORY_CARE_PROVIDER_SITE_OTHER): Payer: Medicare Other | Admitting: Cardiology

## 2019-04-03 ENCOUNTER — Encounter: Payer: Self-pay | Admitting: Cardiology

## 2019-04-03 VITALS — BP 148/69 | Ht 61.0 in | Wt 151.0 lb

## 2019-04-03 DIAGNOSIS — Z952 Presence of prosthetic heart valve: Secondary | ICD-10-CM

## 2019-04-03 DIAGNOSIS — I10 Essential (primary) hypertension: Secondary | ICD-10-CM | POA: Diagnosis not present

## 2019-04-03 DIAGNOSIS — I5032 Chronic diastolic (congestive) heart failure: Secondary | ICD-10-CM | POA: Diagnosis not present

## 2019-04-03 DIAGNOSIS — I2510 Atherosclerotic heart disease of native coronary artery without angina pectoris: Secondary | ICD-10-CM

## 2019-04-03 DIAGNOSIS — I480 Paroxysmal atrial fibrillation: Secondary | ICD-10-CM

## 2019-04-03 NOTE — Patient Instructions (Signed)
Medication Instructions:  The current medical regimen is effective;  continue present plan and medications.  If you need a refill on your cardiac medications before your next appointment, please call your pharmacy.   Follow-Up: At [facility], you and your health needs are our priority.  As part of our continuing mission to provide you with exceptional heart care, we have created designated Provider Care Teams.  These Care Teams include your primary Cardiologist (physician) and Advanced Practice Providers (APPs -  Physician Assistants and Nurse Practitioners) who all work together to provide you with the care you need, when you need it. You will need a follow up appointment in 6 months.  Please call our office 2 months in advance to schedule this appointment.  You may see [name], MD or one of the following Advanced Practice Providers on your designated Care Team:   Kerin Ransom, PA-C Roby Lofts, [place] . Sande Rives, PA-C

## 2019-04-04 DIAGNOSIS — C3411 Malignant neoplasm of upper lobe, right bronchus or lung: Secondary | ICD-10-CM | POA: Diagnosis not present

## 2019-04-04 DIAGNOSIS — Z4822 Encounter for aftercare following kidney transplant: Secondary | ICD-10-CM | POA: Diagnosis not present

## 2019-04-04 DIAGNOSIS — Z79899 Other long term (current) drug therapy: Secondary | ICD-10-CM | POA: Diagnosis not present

## 2019-04-04 DIAGNOSIS — I10 Essential (primary) hypertension: Secondary | ICD-10-CM | POA: Diagnosis not present

## 2019-04-04 DIAGNOSIS — Z940 Kidney transplant status: Secondary | ICD-10-CM | POA: Diagnosis not present

## 2019-04-04 DIAGNOSIS — Z794 Long term (current) use of insulin: Secondary | ICD-10-CM | POA: Diagnosis not present

## 2019-04-04 DIAGNOSIS — Z7952 Long term (current) use of systemic steroids: Secondary | ICD-10-CM | POA: Diagnosis not present

## 2019-04-04 DIAGNOSIS — Z923 Personal history of irradiation: Secondary | ICD-10-CM | POA: Diagnosis not present

## 2019-04-04 DIAGNOSIS — E119 Type 2 diabetes mellitus without complications: Secondary | ICD-10-CM | POA: Diagnosis not present

## 2019-04-04 DIAGNOSIS — C3490 Malignant neoplasm of unspecified part of unspecified bronchus or lung: Secondary | ICD-10-CM | POA: Diagnosis not present

## 2019-04-04 DIAGNOSIS — D8989 Other specified disorders involving the immune mechanism, not elsewhere classified: Secondary | ICD-10-CM | POA: Diagnosis not present

## 2019-04-04 DIAGNOSIS — Z952 Presence of prosthetic heart valve: Secondary | ICD-10-CM | POA: Diagnosis not present

## 2019-04-04 DIAGNOSIS — Z792 Long term (current) use of antibiotics: Secondary | ICD-10-CM | POA: Diagnosis not present

## 2019-04-10 ENCOUNTER — Other Ambulatory Visit: Payer: Self-pay

## 2019-04-10 ENCOUNTER — Ambulatory Visit (INDEPENDENT_AMBULATORY_CARE_PROVIDER_SITE_OTHER): Payer: Medicare Other | Admitting: Pharmacist

## 2019-04-10 DIAGNOSIS — Z5181 Encounter for therapeutic drug level monitoring: Secondary | ICD-10-CM | POA: Diagnosis not present

## 2019-04-10 DIAGNOSIS — I4891 Unspecified atrial fibrillation: Secondary | ICD-10-CM | POA: Diagnosis not present

## 2019-04-10 DIAGNOSIS — I480 Paroxysmal atrial fibrillation: Secondary | ICD-10-CM | POA: Diagnosis not present

## 2019-04-10 LAB — POCT INR: INR: 3.1 — AB (ref 2.0–3.0)

## 2019-04-19 ENCOUNTER — Other Ambulatory Visit: Payer: Self-pay | Admitting: *Deleted

## 2019-04-19 NOTE — Patient Outreach (Signed)
[facility]) Care Management  04/19/2019  [name] 02-24-1946 [id number]  Telephone Assessment-HF  RN spoke briefly with pt today and verified identifiers. Pt inquired if this was a good time for an update on her ongoing management of care. Pt reports she recent loss her daughter and requested a call back due to family visiting in the home at this time. RN offered to follow up next week and offered moral support with condolesence. Pt very grateful and receptive to a call back next week.  Will follow up next week with an update on pt's ongoing management of care related to her HF.  Raina Mina, RN Care Management Coordinator [place] Office [phone]

## 2019-04-20 ENCOUNTER — Encounter: Payer: Self-pay | Admitting: Internal Medicine

## 2019-04-20 ENCOUNTER — Telehealth: Payer: Self-pay

## 2019-04-20 DIAGNOSIS — Z1211 Encounter for screening for malignant neoplasm of colon: Secondary | ICD-10-CM

## 2019-04-20 NOTE — Telephone Encounter (Signed)
Referral sent 

## 2019-04-20 NOTE — Telephone Encounter (Signed)
Copied from [place] [id number]. Topic: Referral - Request for Referral >> Apr 17, 2019  3:03 PM Yvette Rack wrote: Has patient seen PCP for this complaint? yes  *If NO, is insurance requiring patient see PCP for this issue before PCP can refer them? Referral for which specialty: GI for a colonoscopy Preferred provider/office: [facility] Reason for referral: pt requests colonoscopy

## 2019-04-25 ENCOUNTER — Other Ambulatory Visit: Payer: Self-pay | Admitting: *Deleted

## 2019-04-25 NOTE — Patient Outreach (Signed)
[place]) Care Management  04/25/2019  Tjuana Vickrey Dec 08, 1945 [id number]    Telephone Assessment  RN spoke with pt today and received an update on her ongoing management of care. Note last conversation pt's loss her daughter. RN offered moral support and inquired if she needed any medication to assist with her loss (pt declined). RN further inquired ongoing management of care as pt states she continues to weight with 150-151 lbs over the last few days. States her average ranges are from 149-152 lbs. Denies any swelling or SOB. States since her daughter has passed she is requiring use of her oxygen more and is currently wearing her O2 at the prescribed 3 liters. Reports she has contacted her provider concerning her ongoing bleeding (GI) issues and now she is pending several appointments. Two with her oncologist, 11/18 with her kidney and 11/5 with a GI for a colonoscopy. Pt has the oral medicine to take prior to this procedure and aware who to call if she has questions.   Praise pt for taken action in managing her health and reiterated on the current plan of care. Will strongly encouraged pt to review the printed material mailed and focus on the signs and symptoms that may be encountered concenring HF. Will also verify pt remains in the GREEN zone with no acute smyptoms. Will discuss goals met and adjust the interventions accordingly based upon pt's progress. Will offer to follow up in November to inquire on her ongoing management of care. Wife very receptive and grateful for the call and interventions today.  THN CM Care Plan Problem One     Most Recent Value  Care Plan Problem One  Deficient Knowledge related to CHF symptom management  Role Documenting the Problem One  Care Management Coordinator  Care Plan for Problem One  Active  THN Long Term Goal   Pt will verbalized two symptoms of CHF exacerbation and what to do within the next 90 days.  THN Long Term Goal Start Date   12/28/18  Interventions for Problem One Long Term Goal  Will extend to allow ongoing adherence with managing her HF. Will reinterate on the HF zones along with symptoms management. Will request pt to review the printed material and be aware of the possible symptoms that can be encountered.     THN CM Care Plan Problem Two     Most Recent Value  Care Plan Problem Two  GU issues related to history of "blood" found in urine  Role Documenting the Problem Two  Care Management [place] for Problem Two  Not Active  THN CM Short Term Goal #1   Pt will contact her provider for intervention within the next 2 weeks.  THN CM Short Term Goal #1 Start Date  03/20/19  [name] CM Short Term Goal #1 Met Date   04/25/19      Raina Mina, RN Care Management Coordinator [place] Office [phone]

## 2019-05-01 DIAGNOSIS — R319 Hematuria, unspecified: Secondary | ICD-10-CM | POA: Diagnosis not present

## 2019-05-21 ENCOUNTER — Ambulatory Visit (INDEPENDENT_AMBULATORY_CARE_PROVIDER_SITE_OTHER): Payer: Medicare Other | Admitting: Pharmacist Clinician (PhC)/ Clinical Pharmacy Specialist

## 2019-05-21 ENCOUNTER — Other Ambulatory Visit: Payer: Self-pay

## 2019-05-21 DIAGNOSIS — Z5181 Encounter for therapeutic drug level monitoring: Secondary | ICD-10-CM

## 2019-05-21 DIAGNOSIS — I4891 Unspecified atrial fibrillation: Secondary | ICD-10-CM

## 2019-05-21 DIAGNOSIS — I480 Paroxysmal atrial fibrillation: Secondary | ICD-10-CM | POA: Diagnosis not present

## 2019-05-21 LAB — POCT INR: INR: 1 — AB (ref 2.0–3.0)

## 2019-05-22 ENCOUNTER — Telehealth: Payer: Self-pay | Admitting: Cardiology

## 2019-05-22 MED ORDER — WARFARIN SODIUM 5 MG PO TABS: ORAL_TABLET | ORAL | 1 refills | Status: DC

## 2019-05-22 NOTE — Telephone Encounter (Signed)
 *  STAT* If patient is at the pharmacy, call can be transferred to refill team.   1. Which medications need to be refilled? (please list name of each medication and dose if known) warfarin (COUMADIN) 5 MG tablet  2. Which pharmacy/location (including street and city if local pharmacy) is medication to be sent to? [facility] [address]  3. Do they need a 30 day or 90 day supply? [place]

## 2019-05-23 ENCOUNTER — Other Ambulatory Visit: Payer: Self-pay | Admitting: Internal Medicine

## 2019-05-24 ENCOUNTER — Other Ambulatory Visit: Payer: Self-pay

## 2019-05-24 ENCOUNTER — Encounter: Payer: Self-pay | Admitting: Internal Medicine

## 2019-05-24 ENCOUNTER — Ambulatory Visit (INDEPENDENT_AMBULATORY_CARE_PROVIDER_SITE_OTHER): Payer: Medicare Other | Admitting: Internal Medicine

## 2019-05-24 VITALS — BP 134/78 | HR 56 | Temp 97.6°F | Ht 60.0 in | Wt 152.0 lb

## 2019-05-24 DIAGNOSIS — Z7901 Long term (current) use of anticoagulants: Secondary | ICD-10-CM | POA: Diagnosis not present

## 2019-05-24 DIAGNOSIS — K5900 Constipation, unspecified: Secondary | ICD-10-CM | POA: Diagnosis not present

## 2019-05-24 DIAGNOSIS — R109 Unspecified abdominal pain: Secondary | ICD-10-CM

## 2019-05-24 DIAGNOSIS — Z86010 Personal history of colonic polyps: Secondary | ICD-10-CM

## 2019-05-24 DIAGNOSIS — I2510 Atherosclerotic heart disease of native coronary artery without angina pectoris: Secondary | ICD-10-CM | POA: Diagnosis not present

## 2019-05-24 NOTE — Patient Instructions (Signed)
Make sure you are taking your Miralax regularly.  I will call you about your virtual colonoscopy after I have talked to [place].

## 2019-05-24 NOTE — Progress Notes (Signed)
HISTORY OF PRESENT ILLNESS:  [name] is a pleasant 73 y.o. female with INNUMERABLE SIGNIFICANT medical problems and prior surgeries as extensively listed below.  She is on chronic Coumadin and chronic oxygen therapy as well as insulin.  The patient has not been seen in this office since July 2015 when she was evaluated for habitual spitting behavior.  See that dictation.  Patient does have a history of adenomatous colon polyps and has had multiple colonoscopies including 2000, 2003, 2006, and most recently 2011.  At the time of her most recent examination she was found to have a diminutive adenoma and moderate left-sided diverticulosis as well as internal hemorrhoids.  Based on guidelines at that time, follow-up in 5 years recommended.  Patient did not follow-up but does follow-up at this time after having had problems with constipation, bloating, and abdominal discomfort over the course of this year.  She was advised by her nephrology team (she is status post kidney transplantation) to have follow-up colonoscopy.  She makes this appointment.  Patient tells me that she has had abdominal discomfort off and on for some time.  This is associated with bloating.  Symptoms are relieved with defecation.  She uses various agents on demand for her constipation such as Senokot, Dulcolax, and MiraLAX.  Problems with constipation becomes severe about every other month.  At one point she did think that she may have been passing some blood per rectum but it was determined that this was hematuria.  Review of outside blood work from January 02, 2019 shows creatinine 1.96.  INR 3 days ago was 1.0 last hemoglobin December 26, 2018 was normal at 12.1.  REVIEW OF SYSTEMS:  All non-GI ROS negative unless otherwise stated in the HPI except for back pain, hematuria, cough, fatigue, hearing problems, heart murmur, itching, shortness of breath, lower extremity edema, urinary leakage  Past Medical History:  Diagnosis Date  . Acute CHF  (congestive heart failure) ([place]) 12/2018  . Adenocarcinoma of lung, stage 1, right ([place]) 11/25/2017  . Anemia, iron deficiency    of chronic disease  . Aortic stenosis    a. Severe AS by echo 11/2012.  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 Asystole ([place])    a. During ENT surgery 2005: developed marked asystole requiring CPR, felt due to vagal reaction (cath nonobst dz).  . Carotid artery disease ([place])    a. Carotid Dopplers performed in August 2013 showed 40-59% left stenosis and 0-39% right; f/u recommended in 2 years.   . Cerebrovascular accident [facility]) 2009   a. LMCA infarct felt embolic 3557, maintained on chronic coumadin.; denies residual on 04/05/2013  . Cholelithiasis   . Chronic Persistent Atrial Fibrillation 12/31/2008   Qualifier: Diagnosis of  By: [name]    . Coronary artery disease 05/2002   a. Ant MI 2003 s/p PTCA/stent to RCA.   . Diverticulosis of colon   . Esophagitis, reflux   . ESRD (end stage renal disease) ([place])    a. Mass on L kidney per pt s/p nephrectomy - pt states not cancer - [facility] notes indicate ESRD due to HTN/DM - was previously on HD. b. Kidney transplant 02/2011.  Marland Kitchen GERD (gastroesophageal reflux disease)   . Gout   . Hearing loss   . Helicobacter pylori (H. pylori) infection    hx of  . Hemorrhoids   . Hx of colonic polyps    adenomatous  . Hyperlipidemia   . Hypertension   .  Lung nodule seen on imaging study 04/07/2013   1.0 cm ground glass opacity RUL  . Myocardial infarction ([employer]) 2003  . Pericardial effusion    a. Small by echo 11/2011.  . S/P aortic valve replacement with bioprosthetic valve and maze procedure 04/12/2013   50mm [facility] Ease bovine pericardial tissue valve   . S/P Maze operation for atrial fibrillation 04/12/2013   Complete bilateral atrial lesion set using bipolar radiofrequency and cryothermy ablation with clipping of LA appendage  . Sleep apnea    Pt says testing was positive, intolerant of  CPAP.  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  . Stroke ([place])   . Type II diabetes mellitus ([place])     Past Surgical History:  Procedure Laterality Date  . AORTIC VALVE REPLACEMENT N/A 04/12/2013   Procedure: AORTIC VALVE REPLACEMENT (AVR);  Surgeon: Rexene Alberts, MD;  Location: [place];  Service: Open Heart Surgery;  Laterality: N/A;  . ARTERIOVENOUS GRAFT PLACEMENT Left   . ARTERIOVENOUS GRAFT PLACEMENT Left    "I've had 2 on my left; had one removed" (04/05/2013)   . ARTERY EXPLORATION Right 04/11/2013   Procedure: ARTERY EXPLORATION;  Surgeon: Rexene Alberts, MD;  Location: [place];  Service: Open Heart Surgery;  Laterality: Right;  Right carotid artery exploration  . AV FISTULA PLACEMENT Right   . AV FISTULA REPAIR Right    "took it out" ((/18/2014)  . CARDIOVERSION  05/29/2012   Procedure: CARDIOVERSION;  Surgeon: Lelon Perla, MD;  Location: [facility] ENDOSCOPY;  Service: Cardiovascular;  Laterality: N/A;  . CHOLECYSTECTOMY  2009   with hernia removal  . CORONARY ANGIOPLASTY WITH STENT PLACEMENT Right    coronary artery  . INSERTION OF DIALYSIS CATHETER Bilateral    "over the years; took them both out" (04/05/2013)  . INTRAOPERATIVE TRANSESOPHAGEAL ECHOCARDIOGRAM N/A 04/11/2013   Procedure: INTRAOPERATIVE TRANSESOPHAGEAL ECHOCARDIOGRAM;  Surgeon: Rexene Alberts, MD;  Location: [place];  Service: Open Heart Surgery;  Laterality: N/A;  . INTRAOPERATIVE TRANSESOPHAGEAL ECHOCARDIOGRAM N/A 04/12/2013   Procedure: INTRAOPERATIVE TRANSESOPHAGEAL ECHOCARDIOGRAM;  Surgeon: Rexene Alberts, MD;  Location: [place];  Service: Open Heart Surgery;  Laterality: N/A;  . KIDNEY TRANSPLANT  03/16/11  . LEFT AND RIGHT HEART CATHETERIZATION WITH CORONARY ANGIOGRAM N/A 04/06/2013   Procedure: LEFT AND RIGHT HEART CATHETERIZATION WITH CORONARY  ANGIOGRAM;  Surgeon: Blane Ohara, MD;  Location: [facility] CATH LAB;  Service: Cardiovascular;  Laterality: N/A;  . MAZE N/A 04/12/2013   Procedure: MAZE;  Surgeon: Rexene Alberts, MD;  Location: [place];  Service: Open Heart Surgery;  Laterality: N/A;  . NASAL RECONSTRUCTION WITH SEPTAL REPAIR     "took it out" (04/05/2013)  . NEPHRECTOMY Left 2010   no CA on bx  . TONSILLECTOMY    . TOTAL ABDOMINAL HYSTERECTOMY    . TUBAL LIGATION      Social History Lachlyn August  reports that she quit smoking about 17 years ago. Her smoking use included cigarettes. She has a 30.00 pack-year smoking history. She has never used smokeless tobacco. She reports that she does not drink alcohol or use drugs.  family history includes Crohn's disease in an other family member; Diabetes in her maternal grandmother, son, and another family member; Hypertension in her mother; Stroke in her father.  Allergies  Allergen Reactions  . Ibuprofen Nausea And Vomiting  .  Sulfamethoxazole-Trimethoprim Itching, Swelling and Rash    Swelling of the face  . Sulfonamide Derivatives Itching, Swelling and Rash    Swelling of the face  . Tape Rash    Paper tape is ok  . Tramadol Nausea And Vomiting  . Doxycycline Nausea Only  . Hydrocil [Psyllium] Nausea And Vomiting  . Bactrim Itching, Swelling and Rash  . Red Dye Itching and Rash       PHYSICAL EXAMINATION: Vital signs: BP 134/78   Pulse (!) 56   Temp 97.6 F (36.4 C)   Ht 5' (1.524 m)   Wt 152 lb (68.9 kg)   BMI 29.69 kg/m   Constitutional: Pleasant chronically ill-appearing female, no acute distress Psychiatric: alert and oriented x3, cooperative Eyes: extraocular movements intact, anicteric, conjunctiva pink Mouth: oral pharynx moist, no lesions Neck: supple no lymphadenopathy Cardiovascular: Holosystolic murmur with occasional irregular beats Lungs: clear to auscultation bilaterally Abdomen: soft, nontender, nondistended, no obvious ascites, no peritoneal  signs, normal bowel sounds, no organomegaly.  Transplanted kidney in left lower quadrant Rectal: Omitted Extremities: no clubbing or cyanosis.  1+ lower extremity edema bilaterally.  AV fistulas graft left upper extremity with normal thrill Skin: no lesions on visible extremities Neuro: No focal deficits.  Cranial nerves intact  ASSESSMENT:  68.  73 year old female with multiple significant medical problems who presents today with complaints of chronic intermittent constipation with associated abdominal bloating and abdominal discomfort.  She had a history of adenomatous colon polyps with with diminutive adenoma and diverticulosis on most recent colonoscopy in 2011.  Also question of rectal bleeding, the most likely urinary origin.  She does have a history of hemorrhoids.  The patient is at extraordinarily high risk for invasive procedures.  Thus alternatives sought as a way to evaluate and address her complaints.   PLAN:  1.  Virtual colonoscopy with associated abdominal CT to rule out any significant intraluminal abnormalities (mass lesions) and evaluate for intra-abdominal issues respectively.  Patient is too high risk for colonoscopy straightaway. 2.  Recommend that she use MiraLAX on a daily basis for her chronic constipation.  I have advised her on the proper way to titrate MiraLAX to achieve the desired result 3.  We will contact her after the results of virtual colonoscopy available 4.  Resume general medical care with her PCP and other specialists.

## 2019-05-28 ENCOUNTER — Other Ambulatory Visit: Payer: Self-pay | Admitting: *Deleted

## 2019-05-28 NOTE — Patient Outreach (Signed)
[facility]) Care Management  05/28/2019  [name] [birth year] [id number]    Telephone Assessment-Unsuccessful  RN attempted to contact pt on both the home and mobile with HIPAA voice message left to return the call. Will further engage at that time on pt's ongoing management of care with her HF.   Plans: Will follow up with one week if no call back from pt.  Raina Mina, RN Care Management Coordinator [place] Office [phone]

## 2019-05-31 ENCOUNTER — Telehealth: Payer: Self-pay | Admitting: Internal Medicine

## 2019-05-31 ENCOUNTER — Other Ambulatory Visit: Payer: Self-pay | Admitting: *Deleted

## 2019-05-31 NOTE — Patient Outreach (Signed)
[name]) Care Management  05/31/2019  [name] June 23, 1946 [id number]    Telephone Assessment-Successful-Heart Failure  RN spoke with pt today and received an update. Pt continue to monitor her HF with daily weights reporting 149-152 lbs. Denies any 2-3 lbs overnight or 5 lbs over one week. No signs or related symptoms present as pt continue to be mobile taking care of her grandson and attending all her medical office visit appointments. History with pt concerning some bleed issues. Pt also on weekly dialysis. Pt consulted her nephrologist who has recommended GI for a colonoscopy however due to pt's medical history kidney transplant ect. It has been suggested to have a virtual colonoscopy. Currently pt is pending instructions as this will take place at the [name]. Pt also indicated other appointments to repeat her labs prior to this procedure.    RN discussed the current plan of care along with goals and interventions. RN has update the long term interventions according to pt's progress. Based upon pt's management of care pt will be referred to a [place]. RN explained what the Health Coach will focus on and follow up calls can be stretched to a quarterly contact instead of week based upon her management of care. Pt receptive and expressed her appreciation for services rendered. RN will update pt's provider on pt's disposition with [facility] services at this time.   THN CM Care Plan Problem One     Most Recent Value  Care Plan Problem One  Deficient Knowledge related to CHF symptom management  Role Documenting the Problem One  Care Management Coordinator  Care Plan for Problem One  Active  THN Long Term Goal   Pt will verbalized two symptoms of CHF exacerbation and what to do within the next 90 days.  THN Long Term Goal Start Date  12/28/18  Interventions for Problem One Long Term Goal  WIll extend to allow ongoing adherence with up coming prodeures  (colonscopy-due to bleeding). Along with pending appointments. All as presention measurs such as CT scans to monitoring any other Ca.      Raina Mina, RN Care Management Coordinator [place] Office [phone]

## 2019-05-31 NOTE — Telephone Encounter (Signed)
[name] are you working on this?

## 2019-05-31 NOTE — Telephone Encounter (Signed)
Per Donalee Citrin to address Monday 11-16

## 2019-05-31 NOTE — Telephone Encounter (Signed)
Pt called inquiring about virtual colonoscopy that she needs to get scheduled for. Pls call her.

## 2019-06-01 ENCOUNTER — Other Ambulatory Visit: Payer: Self-pay | Admitting: *Deleted

## 2019-06-04 DIAGNOSIS — Z940 Kidney transplant status: Secondary | ICD-10-CM | POA: Diagnosis not present

## 2019-06-04 DIAGNOSIS — N390 Urinary tract infection, site not specified: Secondary | ICD-10-CM | POA: Diagnosis not present

## 2019-06-04 DIAGNOSIS — N184 Chronic kidney disease, stage 4 (severe): Secondary | ICD-10-CM | POA: Diagnosis not present

## 2019-06-04 NOTE — Telephone Encounter (Signed)
Spoke with patient to let her know her virtual colonoscopy was scheduled at [facility], [place] location, 06/19/2019 at 9:00am, arrival of 8:40am.  She needs to go by their office before 06/13/19 to pick up her prep and instructions.  Patient agreed.

## 2019-06-05 ENCOUNTER — Other Ambulatory Visit: Payer: Self-pay | Admitting: Cardiology

## 2019-06-06 DIAGNOSIS — R600 Localized edema: Secondary | ICD-10-CM | POA: Diagnosis not present

## 2019-06-06 DIAGNOSIS — R809 Proteinuria, unspecified: Secondary | ICD-10-CM | POA: Diagnosis not present

## 2019-06-06 DIAGNOSIS — D849 Immunodeficiency, unspecified: Secondary | ICD-10-CM | POA: Diagnosis not present

## 2019-06-06 DIAGNOSIS — E119 Type 2 diabetes mellitus without complications: Secondary | ICD-10-CM | POA: Diagnosis not present

## 2019-06-06 DIAGNOSIS — I10 Essential (primary) hypertension: Secondary | ICD-10-CM | POA: Diagnosis not present

## 2019-06-06 DIAGNOSIS — Z940 Kidney transplant status: Secondary | ICD-10-CM | POA: Diagnosis not present

## 2019-06-11 ENCOUNTER — Encounter: Payer: Self-pay | Admitting: Internal Medicine

## 2019-06-11 ENCOUNTER — Ambulatory Visit (INDEPENDENT_AMBULATORY_CARE_PROVIDER_SITE_OTHER): Payer: Medicare Other | Admitting: Internal Medicine

## 2019-06-11 ENCOUNTER — Other Ambulatory Visit: Payer: Self-pay

## 2019-06-11 DIAGNOSIS — Z794 Long term (current) use of insulin: Secondary | ICD-10-CM

## 2019-06-11 DIAGNOSIS — R269 Unspecified abnormalities of gait and mobility: Secondary | ICD-10-CM | POA: Diagnosis not present

## 2019-06-11 DIAGNOSIS — M25511 Pain in right shoulder: Secondary | ICD-10-CM

## 2019-06-11 DIAGNOSIS — G8929 Other chronic pain: Secondary | ICD-10-CM | POA: Diagnosis not present

## 2019-06-11 DIAGNOSIS — I2510 Atherosclerotic heart disease of native coronary artery without angina pectoris: Secondary | ICD-10-CM | POA: Diagnosis not present

## 2019-06-11 DIAGNOSIS — I151 Hypertension secondary to other renal disorders: Secondary | ICD-10-CM

## 2019-06-11 DIAGNOSIS — E1121 Type 2 diabetes mellitus with diabetic nephropathy: Secondary | ICD-10-CM

## 2019-06-11 DIAGNOSIS — N2889 Other specified disorders of kidney and ureter: Secondary | ICD-10-CM | POA: Diagnosis not present

## 2019-06-11 MED ORDER — LISINOPRIL 5 MG PO TABS: 5.0000 mg | ORAL_TABLET | Freq: Every day | ORAL | 3 refills | Status: DC

## 2019-06-11 MED ORDER — OXYCODONE-ACETAMINOPHEN 5-325 MG PO TABS: 1.0000 | ORAL_TABLET | Freq: Four times a day (QID) | ORAL | 0 refills | Status: DC | PRN

## 2019-06-11 MED ORDER — REPAGLINIDE 1 MG PO TABS: 1.0000 mg | ORAL_TABLET | Freq: Three times a day (TID) | ORAL | 11 refills | Status: DC

## 2019-06-11 NOTE — Assessment & Plan Note (Signed)
Percocet prn for OA  Potential benefits of a long term opioids use as well as potential risks (i.e. addiction risk, apnea etc) and complications (i.e. Somnolence, constipation and others) were explained to the patient and were aknowledged. [employer]

## 2019-06-11 NOTE — Assessment & Plan Note (Signed)
Per Dr Joelyn Oms Amlodipine was stopped; on Lisinopril now

## 2019-06-11 NOTE — Assessment & Plan Note (Signed)
Worse Percocet prn  Potential benefits of a long term opioids use as well as potential risks (i.e. addiction risk, apnea etc) and complications (i.e. Somnolence, constipation and others) were explained to the patient and were aknowledged.

## 2019-06-11 NOTE — Progress Notes (Signed)
Subjective:  [name], female    DOB: Nov 22, 1945  Age: 73 y.o. MRN: [id number]  CC: No chief complaint on file.   HPI [employer] presents for DM - worse; CRF, HTN f/u CBG up to 600 -400-60. Using Toujeo only Per Dr Joelyn Oms Amlodipine was stopped; on Lisinopril now  C/o OA - shoulder and feet - worse - out of Percocet...  Outpatient Medications Prior to Visit  Medication Sig Dispense Refill  . acetaminophen (TYLENOL) 500 MG tablet Take 500 mg by mouth every 6 (six) hours as needed for mild pain or headache.    Marland Kitchen amLODipine (NORVASC) 10 MG tablet Take 0.5 tablets (5 mg total) by mouth every Monday, Wednesday, and Friday. 36 tablet 2  . amoxicillin (AMOXIL) 500 MG capsule TAKE 4 CAPSULES BY MOUTH 1 HOUR PRIOR TO DENTAL WORK 20 capsule 1  . dapsone 25 MG tablet Take 25 mg by mouth daily.     . Insulin Glargine, 1 Unit Dial, (TOUJEO SOLOSTAR) 300 UNIT/ML SOPN Inject 24 Units into the skin every morning. Titrate up by 1 unit a day if needed for goal sugars of 100-130 up to 30 units a day max 3 pen 11  . Insulin Pen Needle (B-D UF III MINI PEN NEEDLES) 31G X 5 MM MISC USE TO ADMINISTER INSULIN FOUR TIMES A DAY DX E11.9 100 each 1  . levothyroxine (SYNTHROID) 25 MCG tablet TAKE 2 TABLETS (50 MCG TOTAL) BY MOUTH DAILY. (Patient taking differently: Take 50 mcg by mouth daily before breakfast. ) 180 tablet 3  . metoprolol tartrate (LOPRESSOR) 25 MG tablet TAKE 0.5 TABLETS (12.5 MG TOTAL) BY MOUTH 2 (TWO) TIMES DAILY. TAKE 1 TABLET BY MOUTH TWICE A DAY (Patient taking differently: Take 12.5 mg by mouth 2 (two) times daily. ) 180 tablet 3  . Multiple Vitamins-Minerals (CENTRUM SILVER 50+WOMEN) TABS Take 1 tablet by mouth daily with breakfast.    . NON FORMULARY Apply 1 application topically See admin instructions. Melaleuca Extra Strength pain cream: Apply to painful sites as needed    . NON FORMULARY Apply 1 application topically See admin instructions. Hempvana Pain Relief Cream:  Apply to painful sites as needed    . ONETOUCH DELICA LANCETS 67E MISC Use to check blood sugars three times a day DX E11.9 100 each 5  . ONETOUCH VERIO test strip USE TO TEST 3 TIMES DAILY. DX E11.9 (Patient taking differently: 1 each by Other route 3 (three) times daily. DX: E11.9) 100 each 5  . OXYGEN Inhale 3 L into the lungs as needed (for shortness of breath).     . predniSONE (DELTASONE) 5 MG tablet Take 5 mg by mouth daily.   5  . Propylene Glycol (SYSTANE BALANCE) 0.6 % SOLN Place 1-2 drops into both eyes 3 (three) times daily as needed (for dryness).    . rosuvastatin (CRESTOR) 20 MG tablet Take 1 tablet (20 mg total) by mouth daily. 90 tablet 3  . spironolactone (ALDACTONE) 25 MG tablet Take 1 tablet (25 mg total) by mouth daily. 60 tablet 2  . tacrolimus (PROGRAF) 1 MG capsule Take 3 mg by mouth 2 (two) times daily.     Marland Kitchen warfarin (COUMADIN) 5 MG tablet Take 1/2 to 1 tablet by mouth daily as directed by coumadin clinic. 90 tablet 1   No facility-administered medications prior to visit.     ROS: Review of Systems  Constitutional: Positive for fatigue. Negative for activity change, appetite change, chills and unexpected weight  change.  HENT: Negative for congestion, mouth sores and sinus pressure.   Eyes: Negative for visual disturbance.  Respiratory: Negative for cough and chest tightness.   Cardiovascular: Positive for leg swelling.  Gastrointestinal: Negative for abdominal pain and nausea.  Genitourinary: Negative for difficulty urinating, frequency and vaginal pain.  Musculoskeletal: Positive for arthralgias and gait problem. Negative for back pain.  Skin: Negative for pallor and rash.  Neurological: Negative for dizziness, tremors, weakness, numbness and headaches.  Psychiatric/Behavioral: Negative for confusion, sleep disturbance and suicidal ideas.    Objective:  BP (!) 142/84 (BP Location: Left Arm, Patient Position: Sitting, Cuff Size: Large)   Pulse 78   Temp 98.4  F (36.9 C) (Oral)   Ht 5' (1.524 m)   Wt 160 lb (72.6 kg)   SpO2 94%   BMI 31.25 kg/m   BP Readings from Last 3 Encounters:  06/11/19 (!) 142/84  05/24/19 134/78  04/03/19 (!) 148/69    Wt Readings from Last 3 Encounters:  06/11/19 160 lb (72.6 kg)  05/24/19 152 lb (68.9 kg)  04/03/19 151 lb (68.5 kg)    Physical Exam Constitutional:      General: She is not in acute distress.    Appearance: She is well-developed.  HENT:     Head: Normocephalic.     Right Ear: External ear normal.     Left Ear: External ear normal.     Nose: Nose normal.  Eyes:     General:        Right eye: No discharge.        Left eye: No discharge.     Conjunctiva/sclera: Conjunctivae normal.     Pupils: Pupils are equal, round, and reactive to light.  Neck:     Musculoskeletal: Normal range of motion and neck supple.     Thyroid: No thyromegaly.     Vascular: No JVD.     Trachea: No tracheal deviation.  Cardiovascular:     Rate and Rhythm: Normal rate and regular rhythm.     Heart sounds: Normal heart sounds.  Pulmonary:     Effort: No respiratory distress.     Breath sounds: No stridor. No wheezing.  Abdominal:     General: Bowel sounds are normal. There is no distension.     Palpations: Abdomen is soft. There is no mass.     Tenderness: There is no abdominal tenderness. There is no guarding or rebound.  Musculoskeletal:        General: Swelling present. No tenderness.  Lymphadenopathy:     Cervical: No cervical adenopathy.  Skin:    Findings: No erythema or rash.  Neurological:     Mental Status: She is oriented to person, place, and time.     Cranial Nerves: No cranial nerve deficit.     Motor: No abnormal muscle tone.     Coordination: Coordination normal.     Gait: Gait abnormal.     Deep Tendon Reflexes: Reflexes normal.  Psychiatric:        Behavior: Behavior normal.        Thought Content: Thought content normal.        Judgment: Judgment normal.     cane Trace edema  B  Lab Results  Component Value Date   WBC 6.9 12/26/2018   HGB 12.1 12/26/2018   HCT 40.3 12/26/2018   PLT 200 12/26/2018   GLUCOSE 350 (H) 01/02/2019   CHOL 155 03/24/2018   TRIG 91 03/24/2018   HDL 64  03/24/2018   LDLDIRECT 89.3 06/13/2006   LDLCALC 73 03/24/2018   ALT 15 12/08/2018   AST 23 12/08/2018   NA 136 01/02/2019   K 4.5 01/02/2019   CL 95 (L) 01/02/2019   CREATININE 1.96 (H) 01/02/2019   BUN 46 (H) 01/02/2019   CO2 29 01/02/2019   TSH 1.645 12/19/2018   INR 1.0 (A) 05/21/2019   HGBA1C 7.9 (H) 12/19/2018   MICROALBUR 317.3 (H) 07/02/2016    Mm Diag Breast Tomo Bilateral  Result Date: 03/13/2019 CLINICAL DATA:  73 year old female who presented in May of 2020 with bilateral breast heaviness and pain. Three-month follow-up was recommended following resolution of her fluid overload. EXAM: DIGITAL DIAGNOSTIC BILATERAL MAMMOGRAM WITH CAD AND TOMO COMPARISON:  Previous exam(s). ACR Breast Density Category b: There are scattered areas of fibroglandular density. FINDINGS: No suspicious calcifications, masses or areas of distortion are seen in the bilateral breasts. The marked bilateral skin thickening and trabecular thickening has resolved. Mammographic images were processed with CAD. IMPRESSION: 1. Resolution of the bilateral skin and trabecular thickening consistent with history of resolved fluid overload. 2.  No mammographic evidence of malignancy in the bilateral breasts. RECOMMENDATION: Screening mammogram in one year.(Code:[id number]-B-01Y) I have discussed the findings and recommendations with the patient. Results were also provided in writing at the conclusion of the visit. If applicable, a reminder letter will be sent to the patient regarding the next appointment. BI-RADS CATEGORY  1: Negative. Electronically Signed   By: Ammie Ferrier M.D.   On: 03/13/2019 15:20    Assessment & Plan:   There are no diagnoses linked to this encounter.   No orders of the defined types  were placed in this encounter.    Follow-up: No follow-ups on file.  Walker Kehr, MD

## 2019-06-11 NOTE — Assessment & Plan Note (Signed)
CBG up to 600 -400-60. Using Toujeo only Add Prandin

## 2019-06-12 ENCOUNTER — Telehealth: Payer: Self-pay | Admitting: Internal Medicine

## 2019-06-12 NOTE — Telephone Encounter (Signed)
Please advise with this being a new medication

## 2019-06-12 NOTE — Telephone Encounter (Signed)
Pharmacy would like to know if pt has been taking repaglinide (PRANDIN) 1 MG tablet  Or if it is new to her. Stated if it is new she may need to start at a lower dose. Requesting CB. Please advise.

## 2019-06-13 ENCOUNTER — Ambulatory Visit (INDEPENDENT_AMBULATORY_CARE_PROVIDER_SITE_OTHER): Payer: Medicare Other | Admitting: Pharmacist

## 2019-06-13 ENCOUNTER — Other Ambulatory Visit: Payer: Self-pay

## 2019-06-13 DIAGNOSIS — Z5181 Encounter for therapeutic drug level monitoring: Secondary | ICD-10-CM

## 2019-06-13 DIAGNOSIS — I480 Paroxysmal atrial fibrillation: Secondary | ICD-10-CM

## 2019-06-13 DIAGNOSIS — I10 Essential (primary) hypertension: Secondary | ICD-10-CM | POA: Diagnosis not present

## 2019-06-13 DIAGNOSIS — R809 Proteinuria, unspecified: Secondary | ICD-10-CM | POA: Diagnosis not present

## 2019-06-13 DIAGNOSIS — I4891 Unspecified atrial fibrillation: Secondary | ICD-10-CM

## 2019-06-13 LAB — POCT INR: INR: 3.8 — AB (ref 2.0–3.0)

## 2019-06-13 MED ORDER — REPAGLINIDE 0.5 MG PO TABS: 0.5000 mg | ORAL_TABLET | Freq: Three times a day (TID) | ORAL | 11 refills | Status: DC

## 2019-06-13 NOTE — Telephone Encounter (Signed)
Ok I'll reduce the dose Thx

## 2019-06-15 ENCOUNTER — Other Ambulatory Visit: Payer: Self-pay

## 2019-06-15 ENCOUNTER — Encounter (HOSPITAL_COMMUNITY): Payer: Self-pay

## 2019-06-15 ENCOUNTER — Inpatient Hospital Stay: Payer: Medicare Other | Attending: Internal Medicine

## 2019-06-15 ENCOUNTER — Ambulatory Visit (HOSPITAL_COMMUNITY)
Admission: RE | Admit: 2019-06-15 | Discharge: 2019-06-15 | Disposition: A | Discharge status: Home / Self Care | Payer: Medicare Other | Source: Ambulatory Visit | Attending: Internal Medicine | Admitting: Internal Medicine

## 2019-06-15 DIAGNOSIS — Z79899 Other long term (current) drug therapy: Secondary | ICD-10-CM | POA: Diagnosis not present

## 2019-06-15 DIAGNOSIS — I132 Hypertensive heart and chronic kidney disease with heart failure and with stage 5 chronic kidney disease, or end stage renal disease: Secondary | ICD-10-CM | POA: Diagnosis not present

## 2019-06-15 DIAGNOSIS — E785 Hyperlipidemia, unspecified: Secondary | ICD-10-CM | POA: Diagnosis not present

## 2019-06-15 DIAGNOSIS — Z794 Long term (current) use of insulin: Secondary | ICD-10-CM | POA: Diagnosis not present

## 2019-06-15 DIAGNOSIS — C3411 Malignant neoplasm of upper lobe, right bronchus or lung: Secondary | ICD-10-CM | POA: Insufficient documentation

## 2019-06-15 DIAGNOSIS — Z923 Personal history of irradiation: Secondary | ICD-10-CM | POA: Diagnosis not present

## 2019-06-15 DIAGNOSIS — I509 Heart failure, unspecified: Secondary | ICD-10-CM | POA: Insufficient documentation

## 2019-06-15 DIAGNOSIS — R5383 Other fatigue: Secondary | ICD-10-CM | POA: Insufficient documentation

## 2019-06-15 DIAGNOSIS — I2510 Atherosclerotic heart disease of native coronary artery without angina pectoris: Secondary | ICD-10-CM | POA: Diagnosis not present

## 2019-06-15 DIAGNOSIS — C3490 Malignant neoplasm of unspecified part of unspecified bronchus or lung: Secondary | ICD-10-CM

## 2019-06-15 DIAGNOSIS — Z940 Kidney transplant status: Secondary | ICD-10-CM | POA: Diagnosis not present

## 2019-06-15 DIAGNOSIS — E1122 Type 2 diabetes mellitus with diabetic chronic kidney disease: Secondary | ICD-10-CM | POA: Diagnosis not present

## 2019-06-15 DIAGNOSIS — Z8673 Personal history of transient ischemic attack (TIA), and cerebral infarction without residual deficits: Secondary | ICD-10-CM | POA: Insufficient documentation

## 2019-06-15 DIAGNOSIS — N186 End stage renal disease: Secondary | ICD-10-CM | POA: Diagnosis not present

## 2019-06-15 DIAGNOSIS — G4730 Sleep apnea, unspecified: Secondary | ICD-10-CM | POA: Diagnosis not present

## 2019-06-15 DIAGNOSIS — K219 Gastro-esophageal reflux disease without esophagitis: Secondary | ICD-10-CM | POA: Diagnosis not present

## 2019-06-15 LAB — CBC WITH DIFFERENTIAL (CANCER CENTER ONLY)
Abs Immature Granulocytes: 0.03 10*3/uL (ref 0.00–0.07)
Basophils Absolute: 0 10*3/uL (ref 0.0–0.1)
Basophils Relative: 0 %
Eosinophils Absolute: 0.1 10*3/uL (ref 0.0–0.5)
Eosinophils Relative: 1 %
HCT: 42.4 % (ref 36.0–46.0)
Hemoglobin: 12.9 g/dL (ref 12.0–15.0)
Immature Granulocytes: 0 %
Lymphocytes Relative: 15 %
Lymphs Abs: 1.1 10*3/uL (ref 0.7–4.0)
MCH: 25.5 pg — ABNORMAL LOW (ref 26.0–34.0)
MCHC: 30.4 g/dL (ref 30.0–36.0)
MCV: 84 fL (ref 80.0–100.0)
Monocytes Absolute: 0.9 10*3/uL (ref 0.1–1.0)
Monocytes Relative: 13 %
Neutro Abs: 5.2 10*3/uL (ref 1.7–7.7)
Neutrophils Relative %: 71 %
Platelet Count: 199 10*3/uL (ref 150–400)
RBC: 5.05 MIL/uL (ref 3.87–5.11)
RDW: 16.7 % — ABNORMAL HIGH (ref 11.5–15.5)
WBC Count: 7.3 10*3/uL (ref 4.0–10.5)
nRBC: 0 % (ref 0.0–0.2)

## 2019-06-15 LAB — CMP (CANCER CENTER ONLY)
ALT: 19 U/L (ref 0–44)
AST: 22 U/L (ref 15–41)
Albumin: 2.8 g/dL — ABNORMAL LOW (ref 3.5–5.0)
Alkaline Phosphatase: 88 U/L (ref 38–126)
Anion gap: 9 (ref 5–15)
BUN: 27 mg/dL — ABNORMAL HIGH (ref 8–23)
CO2: 24 mmol/L (ref 22–32)
Calcium: 9.9 mg/dL (ref 8.9–10.3)
Chloride: 107 mmol/L (ref 98–111)
Creatinine: 1.7 mg/dL — ABNORMAL HIGH (ref 0.44–1.00)
GFR, Est AFR Am: 34 mL/min — ABNORMAL LOW (ref >60)
GFR, Estimated: 29 mL/min — ABNORMAL LOW (ref >60)
Glucose, Bld: 155 mg/dL — ABNORMAL HIGH (ref 70–99)
Potassium: 4.2 mmol/L (ref 3.5–5.1)
Sodium: 140 mmol/L (ref 135–145)
Total Bilirubin: 0.4 mg/dL (ref 0.3–1.2)
Total Protein: 6.4 g/dL — ABNORMAL LOW (ref 6.5–8.1)

## 2019-06-18 ENCOUNTER — Other Ambulatory Visit: Payer: Self-pay

## 2019-06-18 ENCOUNTER — Encounter: Payer: Self-pay | Admitting: Internal Medicine

## 2019-06-18 ENCOUNTER — Inpatient Hospital Stay (HOSPITAL_BASED_OUTPATIENT_CLINIC_OR_DEPARTMENT_OTHER): Payer: Medicare Other | Admitting: Internal Medicine

## 2019-06-18 VITALS — BP 105/76 | HR 80 | Temp 98.0°F | Resp 18 | Ht 60.0 in | Wt 158.8 lb

## 2019-06-18 DIAGNOSIS — C3491 Malignant neoplasm of unspecified part of right bronchus or lung: Secondary | ICD-10-CM

## 2019-06-18 DIAGNOSIS — C3490 Malignant neoplasm of unspecified part of unspecified bronchus or lung: Secondary | ICD-10-CM | POA: Diagnosis not present

## 2019-06-18 DIAGNOSIS — I2510 Atherosclerotic heart disease of native coronary artery without angina pectoris: Secondary | ICD-10-CM | POA: Diagnosis not present

## 2019-06-18 DIAGNOSIS — C3411 Malignant neoplasm of upper lobe, right bronchus or lung: Secondary | ICD-10-CM | POA: Diagnosis not present

## 2019-06-18 DIAGNOSIS — Z923 Personal history of irradiation: Secondary | ICD-10-CM | POA: Diagnosis not present

## 2019-06-18 DIAGNOSIS — E1122 Type 2 diabetes mellitus with diabetic chronic kidney disease: Secondary | ICD-10-CM | POA: Diagnosis not present

## 2019-06-18 DIAGNOSIS — N186 End stage renal disease: Secondary | ICD-10-CM | POA: Diagnosis not present

## 2019-06-18 DIAGNOSIS — I509 Heart failure, unspecified: Secondary | ICD-10-CM | POA: Diagnosis not present

## 2019-06-18 DIAGNOSIS — I132 Hypertensive heart and chronic kidney disease with heart failure and with stage 5 chronic kidney disease, or end stage renal disease: Secondary | ICD-10-CM | POA: Diagnosis not present

## 2019-06-18 NOTE — Progress Notes (Signed)
[place] [phone]   [phone]  OFFICE PROGRESS NOTE  Plotnikov, [name], MD [place] [address]  DIAGNOSIS: Stage IA (T1b, N0, M0) non-small cell lung cancer, well-differentiated adenocarcinoma with biopsy-proven right upper lobe pulmonary nodule diagnosed in May 2019.  The patient also has 2 other suspicious nodule in the right upper lobe and left lower lobe but they are too small to characterize at this point and they could represent multifocal disease.  PRIOR THERAPY: Curative stereotactic radiotherapy to the right upper lobe pulmonary nodule under the care of Dr. Sondra Come completed on December 29, 2017.  CURRENT THERAPY: Observation.  INTERVAL HISTORY: [name] 73 y.o. female returns to the clinic today for 6 months follow-up visit.  The patient is feeling fine today with no concerning complaints except for fatigue and shortness of breath at baseline and she is currently on home oxygen.  Unfortunately she lost her daughter to a stroke recently and granddaughter was murdered few months ago.  The patient denied having any current chest pain, cough or hemoptysis.  She denied having any fever or chills.  She has no nausea, vomiting, diarrhea or constipation.  She has no headache or visual changes.  She is here today for evaluation with repeat CT scan of the chest.  MEDICAL HISTORY: Past Medical History:  Diagnosis Date  . Acute CHF (congestive heart failure) ([place]) 12/2018  . Adenocarcinoma of lung, stage 1, right ([place]) 11/25/2017  . Anemia, iron deficiency    of chronic disease  . Aortic stenosis    a. Severe AS by echo 11/2012.  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 Asystole ([place])    a. During ENT surgery 2005: developed marked asystole requiring CPR, felt due to vagal reaction (cath nonobst dz).  . Carotid artery disease ([place])    a. Carotid Dopplers performed in August 2013 showed 40-59% left stenosis  and 0-39% right; f/u recommended in 2 years.   . Cerebrovascular accident [facility]) 2009   a. LMCA infarct felt embolic 2130, maintained on chronic coumadin.; denies residual on 04/05/2013  . Cholelithiasis   . Chronic Persistent Atrial Fibrillation 12/31/2008   Qualifier: Diagnosis of  By: [name]    . Coronary artery disease 05/2002   a. Ant MI 2003 s/p PTCA/stent to RCA.   . Diverticulosis of colon   . Esophagitis, reflux   . ESRD (end stage renal disease) ([place])    a. Mass on L kidney per pt s/p nephrectomy - pt states not cancer - [facility] notes indicate ESRD due to HTN/DM - was previously on HD. b. Kidney transplant 02/2011.  Marland Kitchen GERD (gastroesophageal reflux disease)   . Gout   . Hearing loss   . Helicobacter pylori (H. pylori) infection    hx of  . Hemorrhoids   . Hx of colonic polyps    adenomatous  . Hyperlipidemia   . Hypertension   . Lung nodule seen on imaging study 04/07/2013   1.0 cm ground glass opacity RUL  . Myocardial infarction ([place]) 2003  . Pericardial effusion    a. Small by echo 11/2011.  . S/P aortic valve replacement with bioprosthetic valve and maze procedure 04/12/2013   73mm [facility] Ease bovine pericardial tissue valve   . S/P Maze operation for atrial fibrillation 04/12/2013   Complete bilateral atrial lesion set using bipolar radiofrequency and cryothermy ablation with clipping of LA appendage  . Sleep  apnea    Pt says testing was positive, intolerant of CPAP.  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  . Stroke ([place])   . Type II diabetes mellitus ([facility])     ALLERGIES:  is allergic to ibuprofen; sulfamethoxazole-trimethoprim; sulfonamide derivatives; tape; tramadol; doxycycline; hydrocil [psyllium]; bactrim; and red dye.  MEDICATIONS:  Current Outpatient Medications  Medication Sig Dispense  Refill  . acetaminophen (TYLENOL) 500 MG tablet Take 500 mg by mouth every 6 (six) hours as needed for mild pain or headache.    Marland Kitchen amoxicillin (AMOXIL) 500 MG capsule TAKE 4 CAPSULES BY MOUTH 1 HOUR PRIOR TO DENTAL WORK 20 capsule 1  . dapsone 25 MG tablet Take 25 mg by mouth daily.     . Insulin Glargine, 1 Unit Dial, (TOUJEO SOLOSTAR) 300 UNIT/ML SOPN Inject 24 Units into the skin every morning. Titrate up by 1 unit a day if needed for goal sugars of 100-130 up to 30 units a day max 3 pen 11  . Insulin Pen Needle (B-D UF III MINI PEN NEEDLES) 31G X 5 MM MISC USE TO ADMINISTER INSULIN FOUR TIMES A DAY DX E11.9 100 each 1  . levothyroxine (SYNTHROID) 25 MCG tablet TAKE 2 TABLETS (50 MCG TOTAL) BY MOUTH DAILY. (Patient taking differently: Take 50 mcg by mouth daily before breakfast. ) 180 tablet 3  . lisinopril (ZESTRIL) 5 MG tablet Take 1 tablet (5 mg total) by mouth daily. 90 tablet 3  . metoprolol tartrate (LOPRESSOR) 25 MG tablet TAKE 0.5 TABLETS (12.5 MG TOTAL) BY MOUTH 2 (TWO) TIMES DAILY. TAKE 1 TABLET BY MOUTH TWICE A DAY (Patient taking differently: Take 12.5 mg by mouth 2 (two) times daily. ) 180 tablet 3  . Multiple Vitamins-Minerals (CENTRUM SILVER 50+WOMEN) TABS Take 1 tablet by mouth daily with breakfast.    . NON FORMULARY Apply 1 application topically See admin instructions. Melaleuca Extra Strength pain cream: Apply to painful sites as needed    . NON FORMULARY Apply 1 application topically See admin instructions. Hempvana Pain Relief Cream: Apply to painful sites as needed    . ONETOUCH DELICA LANCETS 37S MISC Use to check blood sugars three times a day DX E11.9 100 each 5  . ONETOUCH VERIO test strip USE TO TEST 3 TIMES DAILY. DX E11.9 (Patient taking differently: 1 each by Other route 3 (three) times daily. DX: E11.9) 100 each 5  . oxyCODONE-acetaminophen (PERCOCET/ROXICET) 5-325 MG tablet Take 1 tablet by mouth every 6 (six) hours as needed for severe pain. 20 tablet 0  . OXYGEN  Inhale 3 L into the lungs as needed (for shortness of breath).     . predniSONE (DELTASONE) 5 MG tablet Take 5 mg by mouth daily.   5  . Propylene Glycol (SYSTANE BALANCE) 0.6 % SOLN Place 1-2 drops into both eyes 3 (three) times daily as needed (for dryness).    . repaglinide (PRANDIN) 0.5 MG tablet Take 1 tablet (0.5 mg total) by mouth 3 (three) times daily before meals. 90 tablet 11  . rosuvastatin (CRESTOR) 20 MG tablet Take 1 tablet (20 mg total) by mouth daily. 90 tablet 3  . spironolactone (ALDACTONE) 25 MG tablet Take 1 tablet (25 mg total) by mouth daily. 60 tablet 2  . tacrolimus (PROGRAF) 1 MG capsule Take 3 mg by mouth 2 (two) times daily.     Marland Kitchen  warfarin (COUMADIN) 5 MG tablet Take 1/2 to 1 tablet by mouth daily as directed by coumadin clinic. 90 tablet 1   No current facility-administered medications for this visit.     SURGICAL HISTORY:  Past Surgical History:  Procedure Laterality Date  . AORTIC VALVE REPLACEMENT N/A 04/12/2013   Procedure: AORTIC VALVE REPLACEMENT (AVR);  Surgeon: Rexene Alberts, MD;  Location: [place];  Service: Open Heart Surgery;  Laterality: N/A;  . ARTERIOVENOUS GRAFT PLACEMENT Left   . ARTERIOVENOUS GRAFT PLACEMENT Left    "I've had 2 on my left; had one removed" (04/05/2013)   . ARTERY EXPLORATION Right 04/11/2013   Procedure: ARTERY EXPLORATION;  Surgeon: Rexene Alberts, MD;  Location: [place];  Service: Open Heart Surgery;  Laterality: Right;  Right carotid artery exploration  . AV FISTULA PLACEMENT Right   . AV FISTULA REPAIR Right    "took it out" ((/18/2014)  . CARDIOVERSION  05/29/2012   Procedure: CARDIOVERSION;  Surgeon: Lelon Perla, MD;  Location: [facility] ENDOSCOPY;  Service: Cardiovascular;  Laterality: N/A;  . CHOLECYSTECTOMY  2009   with hernia removal  . CORONARY ANGIOPLASTY WITH STENT PLACEMENT Right    coronary artery  . INSERTION OF DIALYSIS CATHETER Bilateral    "over the years; took them both out" (04/05/2013)  . INTRAOPERATIVE  TRANSESOPHAGEAL ECHOCARDIOGRAM N/A 04/11/2013   Procedure: INTRAOPERATIVE TRANSESOPHAGEAL ECHOCARDIOGRAM;  Surgeon: Rexene Alberts, MD;  Location: [facility];  Service: Open Heart Surgery;  Laterality: N/A;  . INTRAOPERATIVE TRANSESOPHAGEAL ECHOCARDIOGRAM N/A 04/12/2013   Procedure: INTRAOPERATIVE TRANSESOPHAGEAL ECHOCARDIOGRAM;  Surgeon: Rexene Alberts, MD;  Location: [place];  Service: Open Heart Surgery;  Laterality: N/A;  . KIDNEY TRANSPLANT  03/16/11  . LEFT AND RIGHT HEART CATHETERIZATION WITH CORONARY ANGIOGRAM N/A 04/06/2013   Procedure: LEFT AND RIGHT HEART CATHETERIZATION WITH CORONARY ANGIOGRAM;  Surgeon: Blane Ohara, MD;  Location: [facility] CATH LAB;  Service: Cardiovascular;  Laterality: N/A;  . MAZE N/A 04/12/2013   Procedure: MAZE;  Surgeon: Rexene Alberts, MD;  Location: [place];  Service: Open Heart Surgery;  Laterality: N/A;  . NASAL RECONSTRUCTION WITH SEPTAL REPAIR     "took it out" (04/05/2013)  . NEPHRECTOMY Left 2010   no CA on bx  . TONSILLECTOMY    . TOTAL ABDOMINAL HYSTERECTOMY    . TUBAL LIGATION      REVIEW OF SYSTEMS:  A comprehensive review of systems was negative except for: Constitutional: positive for fatigue Respiratory: positive for dyspnea on exertion   PHYSICAL EXAMINATION: General appearance: alert, cooperative, fatigued and no distress Head: Normocephalic, without obvious abnormality, atraumatic Neck: no adenopathy, no JVD, supple, symmetrical, trachea midline and thyroid not enlarged, symmetric, no tenderness/mass/nodules Lymph nodes: Cervical, supraclavicular, and axillary nodes normal. Resp: clear to auscultation bilaterally Back: symmetric, no curvature. ROM normal. No CVA tenderness. Cardio: regular rate and rhythm, S1, S2 normal, no murmur, click, rub or gallop GI: soft, non-tender; bowel sounds normal; no masses,  no organomegaly Extremities: edema 1+ edema bilaterally  ECOG PERFORMANCE STATUS: 1 - Symptomatic but completely ambulatory  Blood pressure  105/76, pulse 80, temperature 98 F (36.7 C), temperature source Oral, resp. rate 18, height 5' (1.524 m), weight 158 lb 12.8 oz (72 kg), SpO2 94 %.  LABORATORY DATA: Lab Results  Component Value Date   WBC 7.3 06/15/2019   HGB 12.9 06/15/2019   HCT 42.4 06/15/2019   MCV 84.0 06/15/2019   PLT 199 06/15/2019      Chemistry  Component Value Date/Time   NA 140 06/15/2019 0859   NA 136 01/02/2019 1450   K 4.2 06/15/2019 0859   CL 107 06/15/2019 0859   CO2 24 06/15/2019 0859   BUN 27 (H) 06/15/2019 0859   BUN 46 (H) 01/02/2019 1450   CREATININE 1.70 (H) 06/15/2019 0859   CREATININE 1.35 (H) 03/30/2013 1634      Component Value Date/Time   CALCIUM 9.9 06/15/2019 0859   CALCIUM 9.7 01/29/2008 1338   ALKPHOS 88 06/15/2019 0859   AST 22 06/15/2019 0859   ALT 19 06/15/2019 0859   BILITOT 0.4 06/15/2019 0859       RADIOGRAPHIC STUDIES: Ct Chest Wo Contrast  Result Date: 06/15/2019 CLINICAL DATA:  Lung cancer, non-small cell, radiotherapy completed in 2019 EXAM: CT CHEST WITHOUT CONTRAST TECHNIQUE: Multidetector CT imaging of the chest was performed following the standard protocol without IV contrast. COMPARISON:  12/04/2018 FINDINGS: Cardiovascular: Signs of atherosclerotic change throughout the thoracic aorta. Limited assessment due to lack of intravenous contrast. Dilation of descending thoracic aorta to 3.4 cm is stable. Heart size remains enlarged without pericardial effusion with signs of extensive coronary revascularization is a shin and left atrial appendage clipping. Central pulmonary vasculature is grossly unremarkable. Mediastinum/Nodes: No signs of mediastinal lymphadenopathy. Hilar structures limited but no gross adenopathy here either. Thoracic inlet structures unremarkable aside from stable appearance of dilated and tortuous right brachiocephalic artery extending up towards the thoracic inlet. Lungs/Pleura: Findings of apical ground-glass and consolidation on the right  are similar to the previous exam. Resolution of right-sided pleural fluid since the prior study. No sign of new pulmonary nodule though there are scattered areas of ground-glass on today's study and a small nodule in the superior segment of the left lower lobe measuring 6 mm that is unchanged. Airways are patent. Basilar scarring or atelectasis at the right lung base unchanged. Upper Abdomen: Extensive atherosclerotic calcification and upper abdominal vessels. Incomplete imaging of upper abdominal contents. Signs of marked renal cortical scarring and atrophy with presumed cyst in the upper-interpolar right kidney. Musculoskeletal: No signs of acute bone finding. Post median sternotomy as described. IMPRESSION: 1. Stable appearance of the right apical ground-glass and consolidation, evolving post treatment changes. 2. Interval resolution of right-sided pleural fluid since the prior study. 3. Stable 6 mm left lower lobe pulmonary nodule. 4. Scattered ground-glass which may represent an element of air trapping. 5. Area of bandlike opacity at the right lung base with similar appearance likely related to scarring, attention on follow-up. 6. Stable cardiomegaly with signs of extensive coronary revascularization, aortic valve replacement and left atrial appendage clipping. Other incidental findings as above including stable dilation of the descending thoracic aorta and extensive vascular disease. Aortic Atherosclerosis (ICD10-I70.0). Electronically Signed   By: Zetta Bills M.D.   On: 06/15/2019 10:56    ASSESSMENT AND PLAN: This is a very pleasant 73 years old African-American female with history of stage IA non-small cell lung cancer status post curative stereotactic radiotherapy to the right upper lobe lung nodule in June 2019. She is currently on observation and she is feeling fine today with no concerning complaints. She had repeat CT scan of the chest performed recently.  I personally and independently  reviewed the scans and discussed the results with the patient today. Her scan showed no concerning findings for disease recurrence or progression. I recommended for her to continue on observation with repeat CT scan of the chest in 6 months. The patient was advised to call immediately if  she has any concerning symptoms in the interval. The patient voices understanding of current disease status and treatment options and is in agreement with the current care plan.  All questions were answered. The patient knows to call the clinic with any problems, questions or concerns. We can certainly see the patient much sooner if necessary.  I spent 10 minutes counseling the patient face to face. The total time spent in the appointment was 15 minutes.  Disclaimer: This note was dictated with voice recognition software. Similar sounding words can inadvertently be transcribed and may not be corrected upon review.

## 2019-06-19 ENCOUNTER — Ambulatory Visit
Admission: RE | Admit: 2019-06-19 | Discharge: 2019-06-19 | Disposition: A | Discharge status: Home / Self Care | Payer: Medicare Other | Source: Ambulatory Visit | Attending: Internal Medicine | Admitting: Internal Medicine

## 2019-06-19 ENCOUNTER — Telehealth: Payer: Self-pay | Admitting: Internal Medicine

## 2019-06-19 DIAGNOSIS — R109 Unspecified abdominal pain: Secondary | ICD-10-CM

## 2019-06-19 DIAGNOSIS — K635 Polyp of colon: Secondary | ICD-10-CM | POA: Diagnosis not present

## 2019-06-19 DIAGNOSIS — Z86010 Personal history of colonic polyps: Secondary | ICD-10-CM

## 2019-06-19 DIAGNOSIS — N200 Calculus of kidney: Secondary | ICD-10-CM | POA: Diagnosis not present

## 2019-06-19 DIAGNOSIS — K5900 Constipation, unspecified: Secondary | ICD-10-CM

## 2019-06-19 NOTE — Telephone Encounter (Signed)
Scheduled per los. Called and left msg. Mailed printout  °

## 2019-06-22 ENCOUNTER — Encounter: Payer: Self-pay | Admitting: *Deleted

## 2019-06-22 ENCOUNTER — Other Ambulatory Visit: Payer: Self-pay | Admitting: *Deleted

## 2019-06-22 NOTE — Patient Outreach (Addendum)
[facility]) Care Management  [place]  06/22/2019   [name] [birth year] [id number]   Anna Initial Assessment  Referral Date:  05/31/2019 Referral Source:  Transfer from [place] Reason for Referral:  Continued Disease Management Education Insurance:  Medicare   Outreach Attempt:  Successful telephone outreach to patient for reintroduction and initial telephone assessment.  HIPAA verified with patient.  RN Health Coach reintroduced self and role.  Patient verbally agrees to Disease Management outreaches.  Patient completed initial telephone assessment.  Social:  Patient lives at home with spouse and great grandson.  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  Reports being independent with ADLs and IADLs.  Ambulates with cane in the home and rolling walker outside of the home, denies any falls within the last year.  Does report purchasing a motorized scooter for long distances.  Husband transports her to her medical appointments.  DME in the home include:  Straight cane, Rolator walker, portable pulse oximetry, home oxygen, CBG meter, blood pressure cuff, shower chair with back, bedside commode, eyeglasses, and scale.  Conditions:  Per chart review and discussion with patient, PMH include but not limited to:  Congestive heart failure, lung cancer with radiation treatment, anemia, aortic valve replacement, stroke with residual aphasia, carotid artery disease, chronic persistent atrial fibrillation, coronary artery disease with stenting, end stage renal disease with previous hemodialysis and post left nephrectomy, GERD, gout, hearing loss without hearing aids, hyperlipidemia, hypertension, myocardial infarction, pericardial effusion, sleep apnea, and diabetes.  Patient reports using home oxygen as needed when short of breath.  States it is usually after coming up stairs and with activity.  Does report her  portable O2 tank is not working properly and she is having trouble getting the DME agency to bring her a new tank to her home.  RN Health Coach contacted [place] [facility] and spoke with [facility].  Felicia states replacement tank cannot be delivered to patients home, but family can bring to store in town ([address], [place]) to have it replaced.  Information given to patient and encouraged her to have husband exchange tanks at local store as soon as possible.  Weighs herself daily.  Has not weighed today, but weight yesterday was 157 pounds (within her range).  Denies any increase in shortness of breath or any swelling in extremities or abdomen or breast.  Patient monitors her blood sugars about 2-3 times a day.  Admits blood sugars was ranging in 400-600's few weeks ago and primary care provider initiated her on Prandin.  Fasting blood sugar this morning was 317 and lunchtime sugar was 200.  Admits blood sugars continue to be elevated with some periods of hypoglycemia.  Encouraged patient to contact provider with continued blood sugar elevations.  Last Hgb A1C was 7.9 on 12/19/2018.  Medications:  Patient reports taking about 10 medications a day.  States she manages medications with weekly pill box fills.  Denies any issues with affording medications at this time.  Reviewed medications with patient and administration of Toujeo.  Patient reporting she takes the Toujeo with meals pending her blood sugar readings, for instance blood sugar this morning was 317 and she took 35 Units of Toujeo while on phone with patient at lunch time she took blood sugar and it was 200 and she stated she was going to take 36 Units of Toujeo and that is how she had been  instructed to take it.  Attempted to review medication instructions with patient and she stated how she is taking the medication is how she was told multiple times to take it.  Discussed [facility] referral for medication reconciliation  and administration clarification with provider and patient verbally agrees.  Encounter Medications:  Outpatient Encounter Medications as of 06/22/2019  Medication Sig  . acetaminophen (TYLENOL) 500 MG tablet Take 500 mg by mouth every 6 (six) hours as needed for mild pain or headache.  Marland Kitchen amoxicillin (AMOXIL) 500 MG capsule TAKE 4 CAPSULES BY MOUTH 1 HOUR PRIOR TO DENTAL WORK  . dapsone 25 MG tablet Take 25 mg by mouth daily.   . Insulin Glargine, 1 Unit Dial, (TOUJEO SOLOSTAR) 300 UNIT/ML SOPN Inject 24 Units into the skin every morning. Titrate up by 1 unit a day if needed for goal sugars of 100-130 up to 30 units a day max  . levothyroxine (SYNTHROID) 25 MCG tablet TAKE 2 TABLETS (50 MCG TOTAL) BY MOUTH DAILY. (Patient taking differently: Take 50 mcg by mouth daily before breakfast. )  . lisinopril (ZESTRIL) 5 MG tablet Take 1 tablet (5 mg total) by mouth daily.  . metoprolol tartrate (LOPRESSOR) 25 MG tablet TAKE 0.5 TABLETS (12.5 MG TOTAL) BY MOUTH 2 (TWO) TIMES DAILY. TAKE 1 TABLET BY MOUTH TWICE A DAY (Patient taking differently: Take 12.5 mg by mouth 2 (two) times daily. )  . Multiple Vitamins-Minerals (CENTRUM SILVER 50+WOMEN) TABS Take 1 tablet by mouth daily with breakfast.  . ONETOUCH DELICA LANCETS 24P MISC Use to check blood sugars three times a day DX E11.9  . ONETOUCH VERIO test strip USE TO TEST 3 TIMES DAILY. DX E11.9 (Patient taking differently: 1 each by Other route 3 (three) times daily. DX: E11.9)  . oxyCODONE-acetaminophen (PERCOCET/ROXICET) 5-325 MG tablet Take 1 tablet by mouth every 6 (six) hours as needed for severe pain.  . OXYGEN Inhale 3 L into the lungs as needed (for shortness of breath).   . predniSONE (DELTASONE) 5 MG tablet Take 5 mg by mouth daily.   Marland Kitchen Propylene Glycol (SYSTANE BALANCE) 0.6 % SOLN Place 1-2 drops into both eyes 3 (three) times daily as needed (for dryness).  . repaglinide (PRANDIN) 0.5 MG tablet Take 1 tablet (0.5 mg total) by mouth 3 (three)  times daily before meals.  . rosuvastatin (CRESTOR) 20 MG tablet Take 1 tablet (20 mg total) by mouth daily.  Marland Kitchen spironolactone (ALDACTONE) 25 MG tablet Take 1 tablet (25 mg total) by mouth daily.  . tacrolimus (PROGRAF) 1 MG capsule Take 3 mg by mouth 2 (two) times daily.   Marland Kitchen warfarin (COUMADIN) 5 MG tablet Take 1/2 to 1 tablet by mouth daily as directed by coumadin clinic.  . Insulin Pen Needle (B-D UF III MINI PEN NEEDLES) 31G X 5 MM MISC USE TO ADMINISTER INSULIN FOUR TIMES A DAY DX E11.9  . NON FORMULARY Apply 1 application topically See admin instructions. Melaleuca Extra Strength pain cream: Apply to painful sites as needed  . NON FORMULARY Apply 1 application topically See admin instructions. Hempvana Pain Relief Cream: Apply to painful sites as needed   No facility-administered encounter medications on file as of 06/22/2019.     Functional Status:  In your present state of health, do you have any difficulty performing the following activities: 06/22/2019 12/28/2018  Hearing? Y N  Comment trouble hearing but no hearing aids -  Vision? Y -  Comment reading glasses -  Difficulty concentrating or making  decisions? Y N  Comment somewhat forgetful -  Walking or climbing stairs? Y N  Comment trouble climbing stairs -  Dressing or bathing? N N  Doing errands, shopping? N N  Preparing Food and eating ? N N  Using the Toilet? N N  In the past six months, have you accidently leaked urine? Y Y  Comment some urinary incontinence -  Do you have problems with loss of bowel control? N N  Managing your Medications? N N  Managing your Finances? N N  Housekeeping or managing your Housekeeping? N N  Some recent data might be hidden    Fall/Depression Screening: Fall Risk  06/22/2019 12/28/2018 11/02/2018  Falls in the past year? 0 0 1  Comment - - no falls in the past 11 months  Number falls in past yr: - - 1  Comment - - -  Injury with Fall? - - 0  Risk for fall due to : Medication side  effect;Impaired mobility;Impaired balance/gait - History of fall(s);Impaired vision;Medication side effect;Impaired balance/gait;Impaired mobility  Follow up Falls evaluation completed;Education provided;Falls prevention discussed - Falls evaluation completed;Falls prevention discussed;Education provided   [facility] 2/9 Scores 06/22/2019 12/28/2018 09/21/2018 09/12/2018 10/06/2017 05/05/2017 01/21/2017  PHQ - 2 Score 1 0 0 0 0 0 0   THN CM Care Plan Problem One     Most Recent Value  Care Plan Problem One  Knowledge deficiet related to self care management of diabetes and decreased knowledge related to insulin administration  Role Documenting the Problem One  [place] for Problem One  Active  THN Long Term Goal   Patient will report maintaining Hgb A1C of 8 or below within the next 90 days.  THN Long Term Goal Start Date  06/22/19  Interventions for Problem One Long Term Goal  Care plan and goals reviewed and discussed with patient, reviewed signs and symptoms of hypo and hyperglycemia, discussed importace of glycemic control, reviewed current glucose readings and encouraged patient to contact provider for continued sustained elevations, reviewed medications and encouraged medication compliance, encouraged patient to continue to weigh herself daily and discussed when to contact provider based on weight, reviewed signs and symtoms of heart failure and action plan, [place] contacted [place] to discuss patient portable oxygen tank not working and updated patient on agency requesting she have family come to exchange tank at [employer], sending patient Living Well with Diabetes Educational Booklet and DIabetic Diet Education material  [facility] CM Short Term Goal #1   Patient will verbalize how to take Toujeo insulin correctly within the next 30 days.  THN CM Short Term Goal #1 Start Date  06/22/19  Interventions for Short Term Goal #1  Reviewed toujeo administration orders per chart with  patient, attempted to explain to patient how medication should be titrated, discussed importance of correct administration and dangers of taking medication incorrectly, [place] referral for patient education and medication reconcilliation     Appointments:  Attended appointment with primary care provider, Dr. Alain Marion on 06/11/2019 and has follow up on 07/23/2019.  Attended appointment with Cardiology on 04/03/2019.  Advanced Directives:  Denies having an Advance Directive in place and does not wish to create one at this time.   Consent:  [facility] services reviewed and discussed.  Patient verbally agrees to Disease Management outreaches and [place] referral for medication reconciliation.  Plan: RN Health Coach will send [place] referral for  [place] will send primary MD barriers  letter. RN Health Coach will route initial telephone assessment note to primary MD. [place] will send patient 2021 Calendar Booklet. RN Health Coach will send patient Living Well with Diabetes Educational Packet. RN Health Coach will send patient All About Carbohydrates Education. RN Health Coach will make next telephone outreach to patient within the month of January and patient agreeable to outreach.  [place] [phone] [email or web address]

## 2019-06-25 ENCOUNTER — Other Ambulatory Visit: Payer: Self-pay | Admitting: Pharmacist

## 2019-06-25 NOTE — Patient Outreach (Signed)
[facility]) Care Management  [place]   06/25/2019  [name] 11-May-1946 [id number]  Reason for referral: Medication Management  Referral source: [place] Current insurance: Unknown  PMHx includes but not limited to:  Non-small cell lung CA stage 1 currently on observation therapy, hx CVA, persistent atrial fibrillation on chronic anticoagulation, CAD, HTN, HLD, GERD, gout, T2DM, ESRD s/p kidney transplant  Outreach:  Successful telephone call with [name].  HIPAA identifiers verified.  Patient agreeable to review medications however then requests that I call her back this afternoon as she is too tired at the moment because she did not get to bed until 6:30AM.    Successful call this afternoon with [name].    Subjective:  Patient reports she takes care of her own medications and uses a weekly pillbox.    Toujeo 24 units qAM  CBGS 187 this AM fasting, yesterday 100s.    Has picked up lisinopril - has been checking BP ok at home, patient reports the day it was high in office was because she was rushing, 120s-130s/60-70s.     Objective: The ASCVD Risk score Mikey Bussing DC Jr., et al., 2013) failed to calculate for the following reasons:   The patient has a prior MI or stroke diagnosis  Lab Results  Component Value Date   CREATININE 1.70 (H) 06/15/2019   CREATININE 1.96 (H) 01/02/2019   CREATININE 2.17 (H) 12/26/2018    Lab Results  Component Value Date   HGBA1C 7.9 (H) 12/19/2018    Lipid Panel     Component Value Date/Time   CHOL 155 03/24/2018 1510   TRIG 91 03/24/2018 1510   TRIG 121 06/13/2006 1525   HDL 64 03/24/2018 1510   CHOLHDL 2.4 03/24/2018 1510   CHOLHDL 2 11/18/2016 1354   VLDL 26.2 11/18/2016 1354   LDLCALC 73 03/24/2018 1510   LDLDIRECT 89.3 06/13/2006 1525    BP Readings from Last 3 Encounters:  06/18/19 105/76  06/11/19 (!) 142/84  05/24/19 134/78    Allergies  Allergen Reactions  . Ibuprofen  Nausea And Vomiting  . Sulfamethoxazole-Trimethoprim Itching, Swelling and Rash    Swelling of the face  . Sulfonamide Derivatives Itching, Swelling and Rash    Swelling of the face  . Tape Rash    Paper tape is ok  . Tramadol Nausea And Vomiting  . Doxycycline Nausea Only  . Hydrocil [Psyllium] Nausea And Vomiting  . Bactrim Itching, Swelling and Rash  . Red Dye Itching and Rash    Medications Reviewed Today    Reviewed by Leona Singleton, RN (Registered Nurse) on 06/22/19 at 1206  Med List Status: <None>  Medication Order Taking? Sig Documenting Provider Last Dose Status Informant  acetaminophen (TYLENOL) 500 MG tablet [id number] Yes Take 500 mg by mouth every 6 (six) hours as needed for mild pain or headache. [provider] Taking Active Self  amoxicillin (AMOXIL) 500 MG capsule [id number] Yes TAKE 4 CAPSULES BY MOUTH 1 HOUR PRIOR TO DENTAL WORK Plotnikov, [name], MD Taking Active   dapsone 25 MG tablet [id number] Yes Take 25 mg by mouth daily.  [provider] Taking Active Self  Insulin Glargine, 1 Unit Dial, (TOUJEO SOLOSTAR) 300 UNIT/ML SOPN [id number] Yes Inject 24 Units into the skin every morning. Titrate up by 1 unit a day if needed for goal sugars of 100-130 up to 30 units a day max Plotnikov, [name], MD Taking Active   Insulin Pen Needle (B-D  UF III MINI PEN NEEDLES) 31G X 5 MM MISC [id number]  USE TO ADMINISTER INSULIN FOUR TIMES A DAY DX E11.9 Plotnikov, [name], MD  Active Self  levothyroxine (SYNTHROID) 25 MCG tablet [id number] Yes TAKE 2 TABLETS (50 MCG TOTAL) BY MOUTH DAILY.  Patient taking differently: Take 50 mcg by mouth daily before breakfast.    Plotnikov, [name], MD Taking Active Self  lisinopril (ZESTRIL) 5 MG tablet [id number] Yes Take 1 tablet (5 mg total) by mouth daily. Plotnikov, [name], MD Taking Active   metoprolol tartrate (LOPRESSOR) 25 MG tablet [id number] Yes TAKE 0.5 TABLETS (12.5 MG TOTAL) BY MOUTH 2 (TWO) TIMES DAILY. TAKE 1  TABLET BY MOUTH TWICE A DAY  Patient taking differently: Take 12.5 mg by mouth 2 (two) times daily.    Lelon Perla, MD Taking Active Self  Multiple Vitamins-Minerals (CENTRUM SILVER 50+WOMEN) TABS [id number] Yes Take 1 tablet by mouth daily with breakfast. [provider] Taking Active Self  NON FORMULARY [id number]  Apply 1 application topically See admin instructions. Melaleuca Extra Strength pain cream: Apply to painful sites as needed [provider]  Active Self  NON FORMULARY [id number]  Apply 1 application topically See admin instructions. Hempvana Pain Relief Cream: Apply to painful sites as needed [provider]  Active Self  Jonetta Speak LANCETS 09P MISC [id number] Yes Use to check blood sugars three times a day DX E11.9 Plotnikov, [name], MD Taking Active Self  ONETOUCH VERIO test strip [id number] Yes USE TO TEST 3 TIMES DAILY. DX E11.9  Patient taking differently: 1 each by Other route 3 (three) times daily. DX: E11.9   Hoyt Koch, MD Taking Active Self  oxyCODONE-acetaminophen (PERCOCET/ROXICET) 5-325 MG tablet [id number] Yes Take 1 tablet by mouth every 6 (six) hours as needed for severe pain. Plotnikov, [name], MD Taking Active   OXYGEN [id number] Yes Inhale 3 L into the lungs as needed (for shortness of breath).  [provider] Taking Active Self  predniSONE (DELTASONE) 5 MG tablet [id number] Yes Take 5 mg by mouth daily.  [provider] Taking Active Self  Propylene Glycol (SYSTANE BALANCE) 0.6 % SOLN [id number] Yes Place 1-2 drops into both eyes 3 (three) times daily as needed (for dryness). [provider] Taking Active Self  repaglinide (PRANDIN) 0.5 MG tablet [id number] Yes Take 1 tablet (0.5 mg total) by mouth 3 (three) times daily before meals. Plotnikov, [name], MD Taking Active   rosuvastatin (CRESTOR) 20 MG tablet [id number] Yes Take 1 tablet (20 mg total) by mouth daily. Lelon Perla, MD Taking  Active   spironolactone (ALDACTONE) 25 MG tablet [id number] Yes Take 1 tablet (25 mg total) by mouth daily. Pokhrel, Laxman, MD Taking Active   tacrolimus (PROGRAF) 1 MG capsule [id number] Yes Take 3 mg by mouth 2 (two) times daily.  [provider] Taking Active Self  warfarin (COUMADIN) 5 MG tablet [id number] Yes Take 1/2 to 1 tablet by mouth daily as directed by coumadin clinic. Lelon Perla, MD Taking Active           Assessment: Drugs sorted by system:  Hematologic: warfairn  Cardiovascular: lisinopril, metoprolol tartrate, rosuvastatin, spironolactone  Endocrine: insulin glargine 300 units/mL, repaglinide, levothyroxine  Topical: systane balance eye drops  Pain: acetaminophen, oxycodone-acetaminophen  Infectious Diseases: amoxicillin  Vitamins/Minerals/Supplements: MVI  Miscellaneous: dapsone, tacrolimus, prednisone  Medication Review Findings:  . Confusion regarding dosing / titration of Toujeo.  Reviewed correct dosing at length with patient.  Patient able  to verbalize and repeat back correct dosing.  She would benefit from log-book to keep track of daily fasting CBGs and dose of insulin given. [facility] 2021 calendar book already mailed to patient on 12/4.   Marland Kitchen CrCl ~28/29 ml/min / eGFR 34.   o Spironolactone 62m daily - monitor SCr and K closely o Lisinopril 532m(new medication) - monitor SCr + K closely o Rosuvastatin 2022m Per PI, max dose 79m55mth CrCl < 30 ml/min.  CrCl borderline for adjustment, monitor closely and reduce if clinically warranted  Plan: . Will route note to PCP.  . WiMarland Kitchenl follow-up in 2 weeks.  . HeMarland Kitchenlth Coach mailed out log book to patient already as noted on 06/22/2019 . Will update THN [place], [phone]

## 2019-07-09 ENCOUNTER — Ambulatory Visit (INDEPENDENT_AMBULATORY_CARE_PROVIDER_SITE_OTHER): Payer: Medicare Other | Admitting: Pharmacist Clinician (PhC)/ Clinical Pharmacy Specialist

## 2019-07-09 ENCOUNTER — Ambulatory Visit: Payer: Self-pay | Admitting: Pharmacist

## 2019-07-09 ENCOUNTER — Other Ambulatory Visit: Payer: Self-pay | Admitting: Pharmacist

## 2019-07-09 ENCOUNTER — Other Ambulatory Visit: Payer: Self-pay

## 2019-07-09 DIAGNOSIS — I4891 Unspecified atrial fibrillation: Secondary | ICD-10-CM | POA: Diagnosis not present

## 2019-07-09 DIAGNOSIS — Z5181 Encounter for therapeutic drug level monitoring: Secondary | ICD-10-CM

## 2019-07-09 DIAGNOSIS — I480 Paroxysmal atrial fibrillation: Secondary | ICD-10-CM | POA: Diagnosis not present

## 2019-07-09 LAB — POCT INR: INR: 3.3 — AB (ref 2.0–3.0)

## 2019-07-09 NOTE — Patient Outreach (Signed)
[facility]) Care Management  [place] 07/09/2019  [name] 11-29-1945 [id number]  Reason for call: f/u Toujeo dosing   Successful call with [name].  Patient reports she received [facility] calendar books and has been using them to log appointments and some measurements for diabetes / weight  / BP but also uses another notepad for CBGs at times.  We reviewed that it is best to keep all measurements together so MD can review and assess.  Patient voiced understanding.  She was able to state correct Toujeo titration schedule but asked how to proceed if AM CBG < 100 as this is not in instructions.  She has been going back down on Toujeo dose when this occurs but it does not occur often she states, last episode 2 months ago.  She reports that today CBG = 201 therefore she increased by 1 unit of Toujeo up to 28 units this morning as directed.  I encouraged her to continue to write down CBGs, insulin dose, and time of day to keep track of dosing.  Patient reports she will continue to do so.  She confirmed she has appt in 2 weeks on Jan 4 with PCP and will bring all measurements / glucometer with her to appt.  She will discuss possible CBGs < 100 with PCP to see if he has specific recommendations.    Plan: Will close [facility] pharmacy case as patient has good understanding today of how to dose insulin.  Am happy to assist in the future as needed.   Ralene Bathe, PharmD, [place] [phone]

## 2019-07-14 ENCOUNTER — Ambulatory Visit (HOSPITAL_COMMUNITY)
Admission: EM | Admit: 2019-07-14 | Discharge: 2019-07-14 | Disposition: A | Discharge status: Home / Self Care | Payer: Medicare Other | Source: Home / Self Care

## 2019-07-14 ENCOUNTER — Emergency Department (HOSPITAL_COMMUNITY)
Admission: EM | Admit: 2019-07-14 | Discharge: 2019-07-14 | Discharge status: Against Medical Advice | Payer: Medicare Other | Attending: Emergency Medicine | Admitting: Emergency Medicine

## 2019-07-14 ENCOUNTER — Emergency Department (HOSPITAL_COMMUNITY): Payer: Medicare Other

## 2019-07-14 ENCOUNTER — Other Ambulatory Visit: Payer: Self-pay

## 2019-07-14 ENCOUNTER — Encounter (HOSPITAL_COMMUNITY): Payer: Self-pay | Admitting: *Deleted

## 2019-07-14 DIAGNOSIS — M79602 Pain in left arm: Secondary | ICD-10-CM | POA: Diagnosis present

## 2019-07-14 DIAGNOSIS — Z5321 Procedure and treatment not carried out due to patient leaving prior to being seen by health care provider: Secondary | ICD-10-CM | POA: Insufficient documentation

## 2019-07-14 MED ORDER — OXYCODONE-ACETAMINOPHEN 5-325 MG PO TABS
1.0000 | ORAL_TABLET | ORAL | Status: DC | PRN
  Administered 2019-07-14: 1 via ORAL
  Filled 2019-07-14: qty 1

## 2019-07-14 NOTE — ED Notes (Signed)
Patient fell last night.  Patient has swollen, painful left arm.  Patient has a graft in upper left arm.  Bruising and swelling to left elbow.  Left hand is swollen.  Patient said she hit her head, then said she did not hit head.  Patient woke today with soreness involving left neck and arm.  Spoke to [employer], np.  Patient to go to ED.  Patient called a family member to come and take to ED.

## 2019-07-14 NOTE — ED Triage Notes (Signed)
Pt states she slipped on her kitchen floor and landed on her L elbow.  She wasn't able to sleep all night.  L elbow and L hand swollen.

## 2019-07-14 NOTE — ED Notes (Signed)
Patient is being discharged from the [facility] and sent to the Emergency Department via wheelchair by staff. Per Rozanna Box, NP, patient is stable but in need of higher level of care due to mechanism of injury and complex history. Patient is aware and verbalizes understanding of plan of care. There were no vitals filed for this visit.

## 2019-07-14 NOTE — ED Notes (Signed)
Family at bedside. 

## 2019-07-14 NOTE — ED Notes (Signed)
No answer x3 for v/s

## 2019-07-21 ENCOUNTER — Emergency Department (HOSPITAL_COMMUNITY): Payer: Medicare Other

## 2019-07-21 ENCOUNTER — Encounter (HOSPITAL_COMMUNITY): Payer: Self-pay

## 2019-07-21 ENCOUNTER — Emergency Department (HOSPITAL_COMMUNITY)
Admission: EM | Admit: 2019-07-21 | Discharge: 2019-07-21 | Disposition: A | Discharge status: Home / Self Care | Payer: Medicare Other | Attending: Emergency Medicine | Admitting: Emergency Medicine

## 2019-07-21 DIAGNOSIS — Z79899 Other long term (current) drug therapy: Secondary | ICD-10-CM | POA: Insufficient documentation

## 2019-07-21 DIAGNOSIS — S4992XA Unspecified injury of left shoulder and upper arm, initial encounter: Secondary | ICD-10-CM | POA: Diagnosis present

## 2019-07-21 DIAGNOSIS — N186 End stage renal disease: Secondary | ICD-10-CM | POA: Insufficient documentation

## 2019-07-21 DIAGNOSIS — Z794 Long term (current) use of insulin: Secondary | ICD-10-CM | POA: Insufficient documentation

## 2019-07-21 DIAGNOSIS — Y9389 Activity, other specified: Secondary | ICD-10-CM | POA: Insufficient documentation

## 2019-07-21 DIAGNOSIS — E1122 Type 2 diabetes mellitus with diabetic chronic kidney disease: Secondary | ICD-10-CM | POA: Diagnosis not present

## 2019-07-21 DIAGNOSIS — Z992 Dependence on renal dialysis: Secondary | ICD-10-CM | POA: Insufficient documentation

## 2019-07-21 DIAGNOSIS — W010XXA Fall on same level from slipping, tripping and stumbling without subsequent striking against object, initial encounter: Secondary | ICD-10-CM | POA: Diagnosis not present

## 2019-07-21 DIAGNOSIS — I132 Hypertensive heart and chronic kidney disease with heart failure and with stage 5 chronic kidney disease, or end stage renal disease: Secondary | ICD-10-CM | POA: Diagnosis not present

## 2019-07-21 DIAGNOSIS — Y999 Unspecified external cause status: Secondary | ICD-10-CM | POA: Diagnosis not present

## 2019-07-21 DIAGNOSIS — I2510 Atherosclerotic heart disease of native coronary artery without angina pectoris: Secondary | ICD-10-CM | POA: Insufficient documentation

## 2019-07-21 DIAGNOSIS — I5032 Chronic diastolic (congestive) heart failure: Secondary | ICD-10-CM | POA: Diagnosis not present

## 2019-07-21 DIAGNOSIS — Z87891 Personal history of nicotine dependence: Secondary | ICD-10-CM | POA: Diagnosis not present

## 2019-07-21 DIAGNOSIS — Y92000 Kitchen of unspecified non-institutional (private) residence as  the place of occurrence of the external cause: Secondary | ICD-10-CM | POA: Insufficient documentation

## 2019-07-21 DIAGNOSIS — S40022A Contusion of left upper arm, initial encounter: Secondary | ICD-10-CM | POA: Insufficient documentation

## 2019-07-21 DIAGNOSIS — W19XXXA Unspecified fall, initial encounter: Secondary | ICD-10-CM

## 2019-07-21 MED ORDER — HYDROCODONE-ACETAMINOPHEN 5-325 MG PO TABS
1.0000 | ORAL_TABLET | Freq: Once | ORAL | Status: AC
  Administered 2019-07-21: 1 via ORAL
  Filled 2019-07-21: qty 1

## 2019-07-21 MED ORDER — ONDANSETRON 4 MG PO TBDP
4.0000 mg | ORAL_TABLET | Freq: Once | ORAL | Status: AC
  Administered 2019-07-21: 4 mg via ORAL
  Filled 2019-07-21: qty 1

## 2019-07-21 NOTE — ED Triage Notes (Signed)
Pt states that she fell on Christmas onto her L arm, pt has L bruise up her arm, states that she wants an xray of humerus and told to come back here by here PCP

## 2019-07-21 NOTE — ED Provider Notes (Signed)
[facility] EMERGENCY DEPARTMENT Provider Note   CSN: [id number] Arrival date & time: 07/21/19  1858     History Chief Complaint  Patient presents with  . Arm Injury    [name] is a 74 y.o. female.   74 year old female presents to the emergency department for evaluation of left upper extremity pain and swelling secondary to a fall.  She was putting milk back into the fridge on Christmas Day when she slipped and fell on her left upper extremity.  She has been experiencing pain and swelling to the extremity as well as bruising.  Has applied ice and continue to elevate the extremity which has improved the swelling slightly.  Patient using Tylenol for pain as she took a tablet of Percocet and this made her nauseous.  She was advised to have the extremity evaluated by her primary care doctor.  She is chronically anticoagulated on Coumadin.  The history is provided by the patient. No language interpreter was used.  Arm Injury      Past Medical History:  Diagnosis Date  . Acute CHF (congestive heart failure) ([place]) 12/2018  . Adenocarcinoma of lung, stage 1, right ([place]) 11/25/2017  . Anemia, iron deficiency    of chronic disease  . Aortic stenosis    a. Severe AS by echo 11/2012.  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 Asystole ([place])    a. During ENT surgery 2005: developed marked asystole requiring CPR, felt due to vagal reaction (cath nonobst dz).  . Carotid artery disease ([place])    a. Carotid Dopplers performed in August 2013 showed 40-59% left stenosis and 0-39% right; f/u recommended in 2 years.   . Cerebrovascular accident [facility]) 2009   a. LMCA infarct felt embolic 4270, maintained on chronic coumadin.; denies residual on 04/05/2013  . Cholelithiasis   . Chronic Persistent Atrial Fibrillation 12/31/2008   Qualifier: Diagnosis of  By: [name]    . Coronary artery disease 05/2002   a. Ant MI 2003 s/p PTCA/stent to RCA.   .  Diverticulosis of colon   . Esophagitis, reflux   . ESRD (end stage renal disease) ([place])    a. Mass on L kidney per pt s/p nephrectomy - pt states not cancer - [facility] notes indicate ESRD due to HTN/DM - was previously on HD. b. Kidney transplant 02/2011.  Marland Kitchen GERD (gastroesophageal reflux disease)   . Gout   . Hearing loss   . Helicobacter pylori (H. pylori) infection    hx of  . Hemorrhoids   . Hx of colonic polyps    adenomatous  . Hyperlipidemia   . Hypertension   . Lung nodule seen on imaging study 04/07/2013   1.0 cm ground glass opacity RUL  . Myocardial infarction ([place]) 2003  . Pericardial effusion    a. Small by echo 11/2011.  . S/P aortic valve replacement with bioprosthetic valve and maze procedure 04/12/2013   31mm [facility] Ease bovine pericardial tissue valve   . S/P Maze operation for atrial fibrillation 04/12/2013   Complete bilateral atrial lesion set using bipolar radiofrequency and cryothermy ablation with clipping of LA appendage  . Sleep apnea    Pt says testing was positive, intolerant of CPAP.  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  . Stroke ([place])   . Type II diabetes  [facility])     Patient Active Problem List   Diagnosis Date Noted  . Obesity (BMI 30-39.9) 12/20/2018  . Acute respiratory failure with hypoxia ([place]) 12/19/2018  . Gait disorder 11/22/2018  . History of seizure disorder 09/12/2018  . History of stroke 09/12/2018  . Edema 08/28/2018  . Hypertension secondary to other renal disorders 03/22/2018  . Proteinuria 03/22/2018  . Malignant neoplasm of lung ([place]) 03/22/2018  . Breast pain in female 02/04/2018  . Lung cancer ([place]) 11/30/2017  . Adenocarcinoma of lung, stage 1, right ([place]) 11/25/2017  . Chronic respiratory failure with hypoxia ([place]) 12/27/2016  . Acute on chronic  diastolic CHF (congestive heart failure) ([place]) 12/27/2016  . Left upper extremity swelling 12/27/2016  . CKD (chronic kidney disease), stage III ([place]) 12/27/2016  . Pain of left breast 12/27/2016  . Arm muscle atrophy 11/16/2016  . Insomnia 02/25/2016  . Renal cyst 11/21/2015  . Renal mass, right 11/10/2015  . Shoulder pain, right 08/13/2015  . Weight gain 08/12/2014  . Multinodular goiter 05/01/2014  . Encounter for therapeutic drug monitoring 08/14/2013  . S/P aortic valve replacement with bioprosthetic valve and maze procedure 04/12/2013  . Nodule of right lung 04/07/2013  . Lung nodule seen on imaging study 04/07/2013  . Pain in limb 05/22/2012  . Dermatitis 01/26/2012  . Long term (current) use of anticoagulants 08/17/2011  . Anemia in chronic renal disease 05/13/2011  . Hypokalemia 04/26/2011  . Immunosuppression ([place]) 04/26/2011  . Hypertension associated with diabetes ([place]) 04/26/2011  . Renal transplant recipient 03/16/2011  . Low back pain 02/23/2011  . Hypersalivation 11/24/2010  . AORTIC STENOSIS 08/20/2010  . DISTURBANCE OF SALIVARY SECRETION 07/28/2010  . NAUSEA 04/14/2010  . SMOKER 10/07/2009  . ALOPECIA 10/07/2009  . CAROTID STENOSIS 03/04/2009  . AF (paroxysmal atrial fibrillation) ([place]) 12/31/2008  . NEOPLASM, MALIGNANT, KIDNEY 07/02/2008  . APHASIA DUE TO CEREBROVASCULAR DISEASE 07/02/2008  . Chronic fatigue 05/21/2008  . Hyperlipidemia LDL goal <70 09/27/2007  . Constipation 09/27/2007  . SLEEP APNEA 09/27/2007  . CHOLELITHIASIS 06/09/2007  . HELICOBACTER PYLORI INFECTION, HX OF 06/08/2007  . Type 2 diabetes mellitus with renal manifestations ([place]) 05/23/2007  . Gout 05/23/2007  . 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 MYOCARDIAL INFARCTION, HX OF 05/23/2007  . Coronary atherosclerosis 05/23/2007  . GERD 05/23/2007  . COLONIC POLYPS, HX OF 05/23/2007  . DIVERTICULOSIS, COLON 07/01/2005    Past Surgical History:  Procedure Laterality  Date  . AORTIC VALVE REPLACEMENT N/A 04/12/2013   Procedure: AORTIC VALVE REPLACEMENT (AVR);  Surgeon: Rexene Alberts, MD;  Location: [place];  Service: Open Heart Surgery;  Laterality: N/A;  . ARTERIOVENOUS GRAFT PLACEMENT Left   . ARTERIOVENOUS GRAFT PLACEMENT Left    "I've had 2 on my left; had one removed" (04/05/2013)   . ARTERY EXPLORATION Right 04/11/2013   Procedure: ARTERY EXPLORATION;  Surgeon: Rexene Alberts, MD;  Location: [facility];  Service: Open Heart Surgery;  Laterality: Right;  Right carotid artery exploration  . AV FISTULA PLACEMENT Right   . AV FISTULA REPAIR Right    "took it out" ((/18/2014)  . CARDIOVERSION  05/29/2012   Procedure: CARDIOVERSION;  Surgeon: Lelon Perla, MD;  Location: [facility] ENDOSCOPY;  Service: Cardiovascular;  Laterality: N/A;  . CHOLECYSTECTOMY  2009   with hernia removal  . CORONARY ANGIOPLASTY WITH STENT PLACEMENT Right    coronary artery  . INSERTION OF DIALYSIS CATHETER Bilateral    "over the years; took them both out" (  04/05/2013)  . INTRAOPERATIVE TRANSESOPHAGEAL ECHOCARDIOGRAM N/A 04/11/2013   Procedure: INTRAOPERATIVE TRANSESOPHAGEAL ECHOCARDIOGRAM;  Surgeon: Rexene Alberts, MD;  Location: [facility];  Service: Open Heart Surgery;  Laterality: N/A;  . INTRAOPERATIVE TRANSESOPHAGEAL ECHOCARDIOGRAM N/A 04/12/2013   Procedure: INTRAOPERATIVE TRANSESOPHAGEAL ECHOCARDIOGRAM;  Surgeon: Rexene Alberts, MD;  Location: [facility];  Service: Open Heart Surgery;  Laterality: N/A;  . KIDNEY TRANSPLANT  03/16/11  . LEFT AND RIGHT HEART CATHETERIZATION WITH CORONARY ANGIOGRAM N/A 04/06/2013   Procedure: LEFT AND RIGHT HEART CATHETERIZATION WITH CORONARY ANGIOGRAM;  Surgeon: Blane Ohara, MD;  Location: [facility] CATH LAB;  Service: Cardiovascular;  Laterality: N/A;  . MAZE N/A 04/12/2013   Procedure: MAZE;  Surgeon: Rexene Alberts, MD;  Location: [facility];  Service: Open Heart Surgery;  Laterality: N/A;  . NASAL RECONSTRUCTION WITH SEPTAL REPAIR     "took it out" (04/05/2013)    . NEPHRECTOMY Left 2010   no CA on bx  . TONSILLECTOMY    . TOTAL ABDOMINAL HYSTERECTOMY    . TUBAL LIGATION       OB History   No obstetric history on file.     Family History  Problem Relation Age of Onset  . Stroke Father   . Hypertension Mother   . Diabetes Other   . Diabetes Maternal Grandmother   . Diabetes Son   . Crohn's disease Other        grandson   . Breast cancer Neg Hx   . Stomach cancer Neg Hx   . Esophageal cancer Neg Hx   . Colon cancer Neg Hx   . Pancreatic cancer Neg Hx     Social History   Tobacco Use  . Smoking status: Former Smoker    Packs/day: 1.00    Years: 30.00    Pack years: 30.00    Types: Cigarettes    Quit date: 07/19/2001    Years since quitting: 18.0  . Smokeless tobacco: Never Used  Substance Use Topics  . Alcohol use: No  . Drug use: No    Home Medications Prior to Admission medications   Medication Sig Start Date End Date Taking? Authorizing Provider  acetaminophen (TYLENOL) 500 MG tablet Take 500 mg by mouth every 6 (six) hours as needed for mild pain or headache.    [provider]  amoxicillin (AMOXIL) 500 MG capsule TAKE 4 CAPSULES BY MOUTH 1 HOUR PRIOR TO DENTAL WORK 05/24/19   Plotnikov, [name], MD  dapsone 25 MG tablet Take 25 mg by mouth daily.     [provider]  Insulin Glargine, 1 Unit Dial, (TOUJEO SOLOSTAR) 300 UNIT/ML SOPN Inject 24 Units into the skin every morning. Titrate up by 1 unit a day if needed for goal sugars of 100-130 up to 30 units a day max 03/14/19   Plotnikov, [name], MD  Insulin Pen Needle (B-D UF III MINI PEN NEEDLES) 31G X 5 MM MISC USE TO ADMINISTER INSULIN FOUR TIMES A DAY DX E11.9 11/11/17   Plotnikov, [name], MD  levothyroxine (SYNTHROID) 25 MCG tablet TAKE 2 TABLETS (50 MCG TOTAL) BY MOUTH DAILY. Patient taking differently: Take 50 mcg by mouth daily before breakfast.  12/18/18   Plotnikov, [name], MD  lisinopril (ZESTRIL) 5 MG tablet Take 1 tablet (5 mg total) by  mouth daily. 06/11/19   Plotnikov, [name], MD  metoprolol tartrate (LOPRESSOR) 25 MG tablet TAKE 0.5 TABLETS (12.5 MG TOTAL) BY MOUTH 2 (TWO) TIMES DAILY. TAKE 1 TABLET BY  MOUTH TWICE A DAY Patient taking differently: Take 12.5 mg by mouth 2 (two) times daily.  08/04/18   Lelon Perla, MD  Multiple Vitamins-Minerals (CENTRUM SILVER 50+WOMEN) TABS Take 1 tablet by mouth daily with breakfast.    [provider]  NON FORMULARY Apply 1 application topically See admin instructions. Hempvana Pain Relief Cream: Apply to painful sites as needed    [provider]  [facility] DELICA LANCETS 09X MISC Use to check blood sugars three times a day DX E11.9 10/12/17   Plotnikov, [name], MD  ONETOUCH VERIO test strip USE TO TEST 3 TIMES DAILY. DX E11.9 Patient taking differently: 1 each by Other route 3 (three) times daily. DX: E11.9 10/20/18   Hoyt Koch, MD  oxyCODONE-acetaminophen (PERCOCET/ROXICET) 5-325 MG tablet Take 1 tablet by mouth every 6 (six) hours as needed for severe pain. 06/11/19   Plotnikov, [name], MD  OXYGEN Inhale 3 L into the lungs as needed (for shortness of breath).     [provider]  predniSONE (DELTASONE) 5 MG tablet Take 5 mg by mouth daily.  03/21/18   [provider]  Propylene Glycol (SYSTANE BALANCE) 0.6 % SOLN Place 1-2 drops into both eyes 3 (three) times daily as needed (for dryness).    [provider]  repaglinide (PRANDIN) 0.5 MG tablet Take 1 tablet (0.5 mg total) by mouth 3 (three) times daily before meals. 06/13/19   Plotnikov, [name], MD  rosuvastatin (CRESTOR) 20 MG tablet Take 1 tablet (20 mg total) by mouth daily. 03/21/19   Lelon Perla, MD  spironolactone (ALDACTONE) 25 MG tablet Take 1 tablet (25 mg total) by mouth daily. 12/27/18   Pokhrel, Corrie Mckusick, MD  tacrolimus (PROGRAF) 1 MG capsule Take 3 mg by mouth 2 (two) times daily.     [provider]  warfarin (COUMADIN) 5 MG tablet Take 1/2 to 1  tablet by mouth daily as directed by coumadin clinic. 05/22/19   Lelon Perla, MD    Allergies    Ibuprofen, Sulfamethoxazole-trimethoprim, Sulfonamide derivatives, Tape, Tramadol, Doxycycline, Hydrocil [psyllium], Bactrim, and Red dye  Review of Systems   Review of Systems  Ten systems reviewed and are negative for acute change, except as noted in the HPI.    Physical Exam Updated Vital Signs BP (!) 161/63   Pulse (!) 102   Temp 98.7 F (37.1 C) (Oral)   Resp 18   SpO2 94%   Physical Exam Vitals and nursing note reviewed.  Constitutional:      General: She is not in acute distress.    Appearance: She is well-developed. She is not diaphoretic.     Comments: Nontoxic appearing and in NAD  HENT:     Head: Normocephalic and atraumatic.  Eyes:     General: No scleral icterus.    Conjunctiva/sclera: Conjunctivae normal.  Cardiovascular:     Rate and Rhythm: Normal rate and regular rhythm.     Pulses: Normal pulses.     Comments: Distal radial pulse 2+ in the LUE. Minimal thrill palpated to LUE AV fistula, though pulse present. Pulmonary:     Effort: Pulmonary effort is normal. No respiratory distress.  Musculoskeletal:        General: Normal range of motion.       Arms:     Cervical back: Normal range of motion.     Comments: Normal ROM of the LUE including the shoulder, elbow, wrist. No bony deformity or crepitus. There is pitting  edema to the LUE with quite sizeable area of ecchymosis/hematoma.  Skin:    General: Skin is warm and dry.     Coloration: Skin is not pale.     Findings: No rash.  Neurological:     Mental Status: She is alert and oriented to person, place, and time.     Coordination: Coordination normal.     Comments: Sensation to light touch intact in the LUE and hand.  Psychiatric:        Behavior: Behavior normal.     ED Results / Procedures / Treatments   Labs (all labs ordered are listed, but only abnormal results are displayed) Labs  Reviewed - No data to display  EKG None  Radiology DG Humerus Left  Result Date: 07/21/2019 CLINICAL DATA:  Fall EXAM: LEFT HUMERUS - 2+ VIEW COMPARISON:  None. FINDINGS: No fracture or dislocation of the left humerus. Included joint spaces are unremarkable. 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 Soft tissue edema. IMPRESSION: 1. No fracture or dislocation of the left humerus. 2. Dialysis fistula with associated vascular calcinosis. 3. Soft tissue edema. Electronically Signed   By: Eddie Candle M.D.   On: 07/21/2019 20:28   DG Elbow Complete Left  Result Date: 07/14/2019 CLINICAL DATA:  Elbow pain post fall EXAM: LEFT ELBOW - COMPLETE 3+ VIEW COMPARISON:  LEFT forearm radiographs 05/12/2012 FINDINGS: Osseous mineralization low normal. Extensive soft tissue swelling. Joint spaces preserved. No fracture, dislocation, or bone destruction. Small joint effusion. IMPRESSION: Extensive soft tissue swelling and small elbow joint effusion. No acute bony abnormalities. Electronically Signed   By: Lavonia Dana M.D.   On: 07/14/2019 16:38   DG Wrist Complete Left  Result Date: 07/14/2019 CLINICAL DATA:  Pain secondary to a fall. EXAM: LEFT WRIST - COMPLETE 3+ VIEW COMPARISON:  Radiographs dated 05/12/2012 FINDINGS: There is no evidence of fracture or dislocation. Slight arthritic changes at the first [facility] joint, new since the prior study. Focal cystic degenerative change in the ulnar aspect of the lunate, slightly progressed. Cystic degenerative change in the pisiform. Soft tissues are unremarkable. IMPRESSION: 1. No acute abnormality. Slight arthritic changes at the first [facility] joint. 2. Focal cystic degenerative changes in the lunate, slightly progressed since the prior study. Electronically Signed   By: Lorriane Shire M.D.   On: 07/14/2019 16:41    Procedures Procedures (including critical care time)  Medications Ordered in ED Medications  ondansetron (ZOFRAN-ODT)  disintegrating tablet 4 mg (has no administration in time range)  HYDROcodone-acetaminophen (NORCO/VICODIN) 5-325 MG per tablet 1 tablet (has no administration in time range)    ED Course  I have reviewed the triage vital signs and the nursing notes.  Pertinent labs & imaging results that were available during my care of the patient were reviewed by me and considered in my medical decision making (see chart for details).    MDM Rules/Calculators/A&P                       Patient presents to the emergency department for evaluation of LUE pain following a fall 1 week ago. Large hematoma/contusion noted with preserved A/PROM. Patient neurovascularly intact on exam. Imaging negative for fracture, dislocation, bony deformity. Compartments of the extremity are compressible, warm. Plan for supportive management including RICE and Tylenol; primary care follow up as needed. Return precautions discussed and provided. Patient discharged in stable condition with no unaddressed concerns.   Final Clinical Impression(s) / ED Diagnoses  Final diagnoses:  Traumatic hematoma of left upper arm, initial encounter  Fall, initial encounter    Rx / DC Orders ED Discharge Orders    None       Antonietta Breach, Hershal Coria 07/21/19 2222    Lucrezia Starch, MD 07/23/19 1306    Lucrezia Starch, MD 07/23/19 1311    Lucrezia Starch, MD 07/23/19 [phone]

## 2019-07-21 NOTE — ED Notes (Signed)
Pt verbalized understanding of d/c instructions, follow up care, and s/s requiring return to ed. Pt had no further questions

## 2019-07-23 ENCOUNTER — Ambulatory Visit (INDEPENDENT_AMBULATORY_CARE_PROVIDER_SITE_OTHER): Payer: Medicare Other | Admitting: Internal Medicine

## 2019-07-23 ENCOUNTER — Other Ambulatory Visit: Payer: Self-pay

## 2019-07-23 ENCOUNTER — Encounter: Payer: Self-pay | Admitting: Internal Medicine

## 2019-07-23 VITALS — BP 124/86 | HR 121 | Temp 98.4°F | Ht 60.0 in | Wt 160.0 lb

## 2019-07-23 DIAGNOSIS — S40022D Contusion of left upper arm, subsequent encounter: Secondary | ICD-10-CM

## 2019-07-23 DIAGNOSIS — G8929 Other chronic pain: Secondary | ICD-10-CM

## 2019-07-23 DIAGNOSIS — Z23 Encounter for immunization: Secondary | ICD-10-CM

## 2019-07-23 DIAGNOSIS — I480 Paroxysmal atrial fibrillation: Secondary | ICD-10-CM | POA: Diagnosis not present

## 2019-07-23 DIAGNOSIS — M5440 Lumbago with sciatica, unspecified side: Secondary | ICD-10-CM

## 2019-07-23 DIAGNOSIS — R269 Unspecified abnormalities of gait and mobility: Secondary | ICD-10-CM | POA: Diagnosis not present

## 2019-07-23 MED ORDER — OXYCODONE-ACETAMINOPHEN 5-325 MG PO TABS: 1.0000 | ORAL_TABLET | Freq: Four times a day (QID) | ORAL | 0 refills | Status: DC | PRN

## 2019-07-23 NOTE — Assessment & Plan Note (Signed)
Use a walker

## 2019-07-23 NOTE — Assessment & Plan Note (Signed)
Oxycodone prn -   Potential benefits of a long term oxycodone use as well as potential risks (i.e. addiction risk (low), apnea etc) and complications (i.e. Somnolence, seizures, constipation and others) were explained to the patient and were aknowledged.

## 2019-07-23 NOTE — Progress Notes (Signed)
Subjective:  [name], female    DOB: 02-05-1946  Age: 74 y.o. MRN: [id number]  CC: No chief complaint on file.   HPI [name] presents for post-ER visit - large hematoma L arm, s/p fall on 07/13/19. C/o pain 6/10, can't sleep.Marland KitchenMarland KitchenXrays are ok F/u anticoagulation, AF F/u chronic LBP and R shoulder pain       A large hematoma L arm - soft, painful  Outpatient Medications Prior to Visit  Medication Sig Dispense Refill  . acetaminophen (TYLENOL) 500 MG tablet Take 500 mg by mouth every 6 (six) hours as needed for mild pain or headache.    Marland Kitchen amoxicillin (AMOXIL) 500 MG capsule TAKE 4 CAPSULES BY MOUTH 1 HOUR PRIOR TO DENTAL WORK 20 capsule 1  . dapsone 25 MG tablet Take 25 mg by mouth daily.     . Insulin Glargine, 1 Unit Dial, (TOUJEO SOLOSTAR) 300 UNIT/ML SOPN Inject 24 Units into the skin every morning. Titrate up by 1 unit a day if needed for goal sugars of 100-130 up to 30 units a day max 3 pen 11  . Insulin Pen Needle (B-D UF III MINI PEN NEEDLES) 31G X 5 MM MISC USE TO ADMINISTER INSULIN FOUR TIMES A DAY DX E11.9 100 each 1  . levothyroxine (SYNTHROID) 25 MCG tablet TAKE 2 TABLETS (50 MCG TOTAL) BY MOUTH DAILY. (Patient taking differently: Take 50 mcg by mouth daily before breakfast. ) 180 tablet 3  . lisinopril (ZESTRIL) 5 MG tablet Take 1 tablet (5 mg total) by mouth daily. 90 tablet 3  . metoprolol tartrate (LOPRESSOR) 25 MG tablet TAKE 0.5 TABLETS (12.5 MG TOTAL) BY MOUTH 2 (TWO) TIMES DAILY. TAKE 1 TABLET BY MOUTH TWICE A DAY (Patient taking differently: Take 12.5 mg by mouth 2 (two) times daily. ) 180 tablet 3  . Multiple Vitamins-Minerals (CENTRUM SILVER 50+WOMEN) TABS Take 1 tablet by mouth daily with breakfast.    . NON FORMULARY Apply 1 application topically See admin instructions. Hempvana Pain Relief Cream: Apply to painful sites as needed    . ONETOUCH DELICA LANCETS 08X MISC Use to check blood sugars three times a day DX E11.9 100 each 5  . ONETOUCH  VERIO test strip USE TO TEST 3 TIMES DAILY. DX E11.9 (Patient taking differently: 1 each by Other route 3 (three) times daily. DX: E11.9) 100 each 5  . oxyCODONE-acetaminophen (PERCOCET/ROXICET) 5-325 MG tablet Take 1 tablet by mouth every 6 (six) hours as needed for severe pain. 20 tablet 0  . OXYGEN Inhale 3 L into the lungs as needed (for shortness of breath).     . predniSONE (DELTASONE) 5 MG tablet Take 5 mg by mouth daily.   5  . Propylene Glycol (SYSTANE BALANCE) 0.6 % SOLN Place 1-2 drops into both eyes 3 (three) times daily as needed (for dryness).    . repaglinide (PRANDIN) 0.5 MG tablet Take 1 tablet (0.5 mg total) by mouth 3 (three) times daily before meals. 90 tablet 11  . rosuvastatin (CRESTOR) 20 MG tablet Take 1 tablet (20 mg total) by mouth daily. 90 tablet 3  . spironolactone (ALDACTONE) 25 MG tablet Take 1 tablet (25 mg total) by mouth daily. 60 tablet 2  . tacrolimus (PROGRAF) 1 MG capsule Take 3 mg by mouth 2 (two) times daily.     Marland Kitchen warfarin (COUMADIN) 5 MG tablet Take 1/2 to 1 tablet by mouth daily as directed by coumadin clinic. 90 tablet 1   No facility-administered medications  prior to visit.    ROS: Review of Systems  Constitutional: Positive for fatigue. Negative for activity change, appetite change, chills and unexpected weight change.  HENT: Negative for congestion, mouth sores and sinus pressure.   Eyes: Negative for visual disturbance.  Respiratory: Negative for cough and chest tightness.   Gastrointestinal: Negative for abdominal pain and nausea.  Genitourinary: Negative for difficulty urinating, frequency and vaginal pain.  Musculoskeletal: Positive for gait problem. Negative for back pain.  Skin: Positive for color change. Negative for pallor, rash and wound.  Neurological: Positive for weakness. Negative for dizziness, tremors, numbness and headaches.  Hematological: Bruises/bleeds easily.  Psychiatric/Behavioral: Negative for confusion and sleep  disturbance.    Objective:  BP 124/86 (BP Location: Right Arm, Patient Position: Sitting, Cuff Size: Normal)   Pulse (!) 121   Temp 98.4 F (36.9 C) (Oral)   Ht 5' (1.524 m)   Wt 160 lb (72.6 kg)   SpO2 94%   BMI 31.25 kg/m   BP Readings from Last 3 Encounters:  07/23/19 124/86  07/21/19 (!) 160/99  07/14/19 139/81    Wt Readings from Last 3 Encounters:  07/23/19 160 lb (72.6 kg)  07/14/19 158 lb (71.7 kg)  06/18/19 158 lb 12.8 oz (72 kg)    Physical Exam Constitutional:      General: She is not in acute distress.    Appearance: She is well-developed. She is obese.  HENT:     Head: Normocephalic.     Right Ear: External ear normal.     Left Ear: External ear normal.     Nose: Nose normal.  Eyes:     General:        Right eye: No discharge.        Left eye: No discharge.     Conjunctiva/sclera: Conjunctivae normal.     Pupils: Pupils are equal, round, and reactive to light.  Neck:     Thyroid: No thyromegaly.     Vascular: No JVD.     Trachea: No tracheal deviation.  Cardiovascular:     Rate and Rhythm: Normal rate and regular rhythm.     Heart sounds: Normal heart sounds.  Pulmonary:     Effort: No respiratory distress.     Breath sounds: No stridor. No wheezing.  Abdominal:     General: Bowel sounds are normal. There is no distension.     Palpations: Abdomen is soft. There is no mass.     Tenderness: There is no abdominal tenderness. There is no guarding or rebound.  Musculoskeletal:        General: No tenderness.     Cervical back: Normal range of motion and neck supple.  Lymphadenopathy:     Cervical: No cervical adenopathy.  Skin:    Findings: Bruising present. No erythema or rash.  Neurological:     Mental Status: She is oriented to person, place, and time.     Cranial Nerves: No cranial nerve deficit.     Motor: No abnormal muscle tone.     Coordination: Coordination normal.     Deep Tendon Reflexes: Reflexes normal.  Psychiatric:         Behavior: Behavior normal.        Thought Content: Thought content normal.        Judgment: Judgment normal.    A large hematoma L arm - soft, painful   Lab Results  Component Value Date   WBC 7.3 06/15/2019   HGB 12.9 06/15/2019  HCT 42.4 06/15/2019   PLT 199 06/15/2019   GLUCOSE 155 (H) 06/15/2019   CHOL 155 03/24/2018   TRIG 91 03/24/2018   HDL 64 03/24/2018   LDLDIRECT 89.3 06/13/2006   LDLCALC 73 03/24/2018   ALT 19 06/15/2019   AST 22 06/15/2019   NA 140 06/15/2019   K 4.2 06/15/2019   CL 107 06/15/2019   CREATININE 1.70 (H) 06/15/2019   BUN 27 (H) 06/15/2019   CO2 24 06/15/2019   TSH 1.645 12/19/2018   INR 3.3 (A) 07/09/2019   HGBA1C 7.9 (H) 12/19/2018   MICROALBUR 317.3 (H) 07/02/2016    DG Humerus Left  Result Date: 07/21/2019 CLINICAL DATA:  Fall EXAM: LEFT HUMERUS - 2+ VIEW COMPARISON:  None. FINDINGS: No fracture or dislocation of the left humerus. Included joint spaces are unremarkable. 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 Soft tissue edema. IMPRESSION: 1. No fracture or dislocation of the left humerus. 2. Dialysis fistula with associated vascular calcinosis. 3. Soft tissue edema. Electronically Signed   By: Eddie Candle M.D.   On: 07/21/2019 20:28    Assessment & Plan:     Follow-up: No follow-ups on file.  Walker Kehr, MD

## 2019-07-23 NOTE — Assessment & Plan Note (Signed)
On Coumadin Rate controlled

## 2019-07-23 NOTE — Assessment & Plan Note (Addendum)
A large hematoma L arm, s/p fall on 07/13/19. C/o pain 6/10, can't sleep... Norco prn QID

## 2019-07-25 ENCOUNTER — Telehealth: Payer: Self-pay

## 2019-07-25 NOTE — Telephone Encounter (Signed)
-----   Message from [place], [facility] sent at 07/25/2019  7:59 AM EST ----- Regarding: change appt date Please change appt from 1/11 to any day this week.  thanks ----- Message ----- From: Rudean Haskell, [name] Sent: 07/23/2019   3:05 PM EST To: Rockne Menghini, RPH-CPP, #  Hi Rick,  Thanks for reaching out!  I'm forwarding your message about trying to move up next INR check to 2 pharmacists who are assisting with Ms. Sudano's anti-coag f/u at her cardiology office.    Colleen ----- Message ----- From: Lucrezia Starch, MD Sent: 07/23/2019   1:20 PM EST To: Rudean Haskell, RPH  Hi Colleen,  I'm one of the ER docs at [facility], signing notes this afternoon. This lady follows with you for coumadin dosing for afib. She came in over the weekend for a large arm hematoma but didn't get a PT/INR checked. Her next check is 1/11. Do you think it'd be possible to move up her next INR check sooner to sometime this week?  Thanks! Liliane Channel

## 2019-07-25 NOTE — Telephone Encounter (Signed)
Called and rescheduled appt pt voiced understanding

## 2019-07-26 ENCOUNTER — Ambulatory Visit (INDEPENDENT_AMBULATORY_CARE_PROVIDER_SITE_OTHER): Payer: Medicare Other | Admitting: Pharmacist Clinician (PhC)/ Clinical Pharmacy Specialist

## 2019-07-26 ENCOUNTER — Other Ambulatory Visit: Payer: Self-pay

## 2019-07-26 DIAGNOSIS — Z5181 Encounter for therapeutic drug level monitoring: Secondary | ICD-10-CM

## 2019-07-26 DIAGNOSIS — I4891 Unspecified atrial fibrillation: Secondary | ICD-10-CM | POA: Diagnosis not present

## 2019-07-26 DIAGNOSIS — I480 Paroxysmal atrial fibrillation: Secondary | ICD-10-CM | POA: Diagnosis not present

## 2019-07-26 LAB — POCT INR: INR: 2.7 (ref 2.0–3.0)

## 2019-08-03 ENCOUNTER — Encounter: Payer: Self-pay | Admitting: *Deleted

## 2019-08-03 ENCOUNTER — Other Ambulatory Visit: Payer: Self-pay | Admitting: *Deleted

## 2019-08-03 NOTE — Patient Outreach (Signed)
[facility]) Care Management  08/03/2019  [name] May 22, 1946 [id number]   RN Health Coach Monthly Outreach  Referral Date:  05/31/2019 Referral Source:  Transfer from [place] Reason for Referral:  Continued Disease Management Education Insurance:  Medicare   Outreach Attempt:   Successful telephone outreach to patient for follow up.  HIPAA verified with patient.  Patient reporting fall on Christmas day and injuring left arm (large bruising and hematoma).  Has had 2 emergency room visits post fall and has seen primary care provider.  Fall precautions and preventions reviewed and discussed.  Xray's have been negative thus far for fracture.  Patient stating bruising is getting better and swelling is going down.  States she has movement in her fingers, wrist and shoulder, but limited movement and a lot of pain in her elbow.  Has been keeping are elevated and using ice to help with pain.  Encouraged patient to contact primary care provider for continued pain and limited movement in her elbow.  Continues to monitor blood sugars.  Fasting blood sugar this morning was 170 with fasting ranges of 100-170's.  Able to verbalize taking insulin once a day and titrating up one unit based on blood sugar.  Weighs daily, weight this morning was 159 pounds (patient stating it is about her baseline).   Denies any lower extremity edema at this time.  Reports wearing her oxygen mostly all times now, but denies any increase in shortness of breath, states it is about the same.  Still stating her portable oxygen is not working and when her husband went to exchange at the [employer] he was told they no longer carried the tanks.  RN Health Coach contact [place] [phone]) and spoke with Larene Beach and Elberta Fortis.  Elberta Fortis at the [facility] stated they had one machine they could exchange with patient and he would transport to the [employer] store on Monday  for patient's husband to pick up on Tuesday.  Outreach to patient and updated her to have husband pick up new portable oxygen on Tuesday from [facility] store.  Stated her understanding.  Appointments:  Attended appointment with primary care provider, Dr. Alain Marion on 07/23/2019 and has scheduled follow up on 08/20/2019.  Plan: RN Health Coach contacted [facility] to assist patient with portable oxygen concentrator. RN Health Coach will make next telephone outreach to patient within the month of February and patient agrees to future outreach.  [place] [phone] [email or web address]

## 2019-08-17 ENCOUNTER — Other Ambulatory Visit: Payer: Self-pay

## 2019-08-17 MED ORDER — METOPROLOL TARTRATE 25 MG PO TABS: 12.5000 mg | ORAL_TABLET | Freq: Two times a day (BID) | ORAL | 3 refills | Status: DC

## 2019-08-20 ENCOUNTER — Encounter: Payer: Self-pay | Admitting: Internal Medicine

## 2019-08-20 ENCOUNTER — Ambulatory Visit (INDEPENDENT_AMBULATORY_CARE_PROVIDER_SITE_OTHER): Payer: Medicare Other | Admitting: Internal Medicine

## 2019-08-20 ENCOUNTER — Other Ambulatory Visit: Payer: Self-pay

## 2019-08-20 DIAGNOSIS — Z794 Long term (current) use of insulin: Secondary | ICD-10-CM

## 2019-08-20 DIAGNOSIS — R269 Unspecified abnormalities of gait and mobility: Secondary | ICD-10-CM

## 2019-08-20 DIAGNOSIS — E1121 Type 2 diabetes mellitus with diabetic nephropathy: Secondary | ICD-10-CM | POA: Diagnosis not present

## 2019-08-20 DIAGNOSIS — R0600 Dyspnea, unspecified: Secondary | ICD-10-CM | POA: Diagnosis not present

## 2019-08-20 DIAGNOSIS — F17200 Nicotine dependence, unspecified, uncomplicated: Secondary | ICD-10-CM

## 2019-08-20 DIAGNOSIS — I2510 Atherosclerotic heart disease of native coronary artery without angina pectoris: Secondary | ICD-10-CM

## 2019-08-20 DIAGNOSIS — C3411 Malignant neoplasm of upper lobe, right bronchus or lung: Secondary | ICD-10-CM

## 2019-08-20 DIAGNOSIS — R609 Edema, unspecified: Secondary | ICD-10-CM

## 2019-08-20 DIAGNOSIS — J449 Chronic obstructive pulmonary disease, unspecified: Secondary | ICD-10-CM | POA: Diagnosis not present

## 2019-08-20 DIAGNOSIS — I480 Paroxysmal atrial fibrillation: Secondary | ICD-10-CM | POA: Diagnosis not present

## 2019-08-20 MED ORDER — TRELEGY ELLIPTA 100-62.5-25 MCG/INH IN AEPB: 1.0000 | INHALATION_SPRAY | Freq: Every day | RESPIRATORY_TRACT | 11 refills | Status: DC

## 2019-08-20 MED ORDER — HYDROCODONE-ACETAMINOPHEN 5-325 MG PO TABS: 1.0000 | ORAL_TABLET | Freq: Four times a day (QID) | ORAL | 0 refills | Status: DC | PRN

## 2019-08-20 NOTE — Assessment & Plan Note (Signed)
[name]

## 2019-08-20 NOTE — Assessment & Plan Note (Signed)
Labs

## 2019-08-20 NOTE — Assessment & Plan Note (Signed)
Chest CT On O2 Trelegy qd

## 2019-08-20 NOTE — Assessment & Plan Note (Signed)
Remote

## 2019-08-20 NOTE — Assessment & Plan Note (Signed)
No relapse 

## 2019-08-20 NOTE — Assessment & Plan Note (Signed)
Chest CT w/o contrast due to CRF

## 2019-08-20 NOTE — Progress Notes (Signed)
Subjective:  [name], female    DOB: March 20, 1946  Age: 74 y.o. MRN: [id number]  CC: No chief complaint on file.   HPI [employer] presents for DOE. Last chest CT 11/20. No wheezing... No CP F/u CRF, HTN, OA C/o LBP - Percocet - caused nausea; Norco worked better   Outpatient Medications Prior to Visit  Medication Sig Dispense Refill  . acetaminophen (TYLENOL) 500 MG tablet Take 500 mg by mouth every 6 (six) hours as needed for mild pain or headache.    Marland Kitchen amoxicillin (AMOXIL) 500 MG capsule TAKE 4 CAPSULES BY MOUTH 1 HOUR PRIOR TO DENTAL WORK 20 capsule 1  . dapsone 25 MG tablet Take 25 mg by mouth daily.     . Insulin Glargine, 1 Unit Dial, (TOUJEO SOLOSTAR) 300 UNIT/ML SOPN Inject 24 Units into the skin every morning. Titrate up by 1 unit a day if needed for goal sugars of 100-130 up to 30 units a day max 3 pen 11  . Insulin Pen Needle (B-D UF III MINI PEN NEEDLES) 31G X 5 MM MISC USE TO ADMINISTER INSULIN FOUR TIMES A DAY DX E11.9 100 each 1  . levothyroxine (SYNTHROID) 25 MCG tablet TAKE 2 TABLETS (50 MCG TOTAL) BY MOUTH DAILY. (Patient taking differently: Take 50 mcg by mouth daily before breakfast. ) 180 tablet 3  . lisinopril (ZESTRIL) 5 MG tablet Take 1 tablet (5 mg total) by mouth daily. 90 tablet 3  . metoprolol tartrate (LOPRESSOR) 25 MG tablet Take 0.5 tablets (12.5 mg total) by mouth 2 (two) times daily. TAKE 1 TABLET BY MOUTH TWICE A DAY 180 tablet 3  . Multiple Vitamins-Minerals (CENTRUM SILVER 50+WOMEN) TABS Take 1 tablet by mouth daily with breakfast.    . NON FORMULARY Apply 1 application topically See admin instructions. Hempvana Pain Relief Cream: Apply to painful sites as needed    . ONETOUCH DELICA LANCETS 02O MISC Use to check blood sugars three times a day DX E11.9 100 each 5  . ONETOUCH VERIO test strip USE TO TEST 3 TIMES DAILY. DX E11.9 (Patient taking differently: 1 each by Other route 3 (three) times daily. DX: E11.9) 100 each 5  .  oxyCODONE-acetaminophen (PERCOCET/ROXICET) 5-325 MG tablet Take 1 tablet by mouth every 6 (six) hours as needed for severe pain. 60 tablet 0  . OXYGEN Inhale 3 L into the lungs as needed (for shortness of breath).     . predniSONE (DELTASONE) 5 MG tablet Take 5 mg by mouth daily.   5  . Propylene Glycol (SYSTANE BALANCE) 0.6 % SOLN Place 1-2 drops into both eyes 3 (three) times daily as needed (for dryness).    . repaglinide (PRANDIN) 0.5 MG tablet Take 1 tablet (0.5 mg total) by mouth 3 (three) times daily before meals. 90 tablet 11  . rosuvastatin (CRESTOR) 20 MG tablet Take 1 tablet (20 mg total) by mouth daily. 90 tablet 3  . spironolactone (ALDACTONE) 25 MG tablet Take 1 tablet (25 mg total) by mouth daily. 60 tablet 2  . tacrolimus (PROGRAF) 1 MG capsule Take 3 mg by mouth 2 (two) times daily.     Marland Kitchen warfarin (COUMADIN) 5 MG tablet Take 1/2 to 1 tablet by mouth daily as directed by coumadin clinic. 90 tablet 1   No facility-administered medications prior to visit.    ROS: Review of Systems  Constitutional: Positive for fatigue. Negative for activity change, appetite change, chills and unexpected weight change.  HENT: Negative for congestion,  mouth sores and sinus pressure.   Eyes: Negative for visual disturbance.  Respiratory: Positive for shortness of breath. Negative for cough, chest tightness, wheezing and stridor.   Cardiovascular: Negative for palpitations and leg swelling.  Gastrointestinal: Negative for abdominal pain and nausea.  Genitourinary: Negative for difficulty urinating, frequency and vaginal pain.  Musculoskeletal: Positive for arthralgias and back pain. Negative for gait problem.  Skin: Negative for pallor and rash.  Neurological: Negative for dizziness, tremors, weakness, numbness and headaches.  Psychiatric/Behavioral: Negative for confusion, sleep disturbance and suicidal ideas. The patient is nervous/anxious.     Objective:  BP 136/82 (BP Location: Left Arm,  Patient Position: Sitting, Cuff Size: Normal)   Pulse 61   Temp 98.5 F (36.9 C) (Oral)   Ht 5' (1.524 m)   Wt 157 lb 2 oz (71.3 kg)   SpO2 94%   BMI 30.69 kg/m   BP Readings from Last 3 Encounters:  08/20/19 136/82  07/23/19 124/86  07/21/19 (!) 160/99    Wt Readings from Last 3 Encounters:  08/20/19 157 lb 2 oz (71.3 kg)  07/23/19 160 lb (72.6 kg)  07/14/19 158 lb (71.7 kg)    Physical Exam Constitutional:      General: She is not in acute distress.    Appearance: She is well-developed.  HENT:     Head: Normocephalic.     Right Ear: External ear normal.     Left Ear: External ear normal.     Nose: Nose normal.  Eyes:     General:        Right eye: No discharge.        Left eye: No discharge.     Conjunctiva/sclera: Conjunctivae normal.     Pupils: Pupils are equal, round, and reactive to light.  Neck:     Thyroid: No thyromegaly.     Vascular: No JVD.     Trachea: No tracheal deviation.  Cardiovascular:     Rate and Rhythm: Normal rate and regular rhythm.     Heart sounds: Normal heart sounds.  Pulmonary:     Effort: No respiratory distress.     Breath sounds: No stridor. No wheezing.  Abdominal:     General: Bowel sounds are normal. There is no distension.     Palpations: Abdomen is soft. There is no mass.     Tenderness: There is no abdominal tenderness. There is no guarding or rebound.  Musculoskeletal:        General: No tenderness.     Cervical back: Normal range of motion and neck supple.  Lymphadenopathy:     Cervical: No cervical adenopathy.  Skin:    Findings: No erythema or rash.  Neurological:     Mental Status: She is oriented to person, place, and time.     Cranial Nerves: No cranial nerve deficit.     Motor: No abnormal muscle tone.     Coordination: Coordination abnormal.     Deep Tendon Reflexes: Reflexes normal.  Psychiatric:        Behavior: Behavior normal.        Thought Content: Thought content normal.        Judgment: Judgment  normal.    Walker O2 is on  I personally provided Trelegy  inhaler use teaching. After the teaching patient was able to demonstrate it's use effectively. All questions were answered   Lab Results  Component Value Date   WBC 7.3 06/15/2019   HGB 12.9 06/15/2019   HCT 42.4  06/15/2019   PLT 199 06/15/2019   GLUCOSE 155 (H) 06/15/2019   CHOL 155 03/24/2018   TRIG 91 03/24/2018   HDL 64 03/24/2018   LDLDIRECT 89.3 06/13/2006   LDLCALC 73 03/24/2018   ALT 19 06/15/2019   AST 22 06/15/2019   NA 140 06/15/2019   K 4.2 06/15/2019   CL 107 06/15/2019   CREATININE 1.70 (H) 06/15/2019   BUN 27 (H) 06/15/2019   CO2 24 06/15/2019   TSH 1.645 12/19/2018   INR 2.7 07/26/2019   HGBA1C 7.9 (H) 12/19/2018   MICROALBUR 317.3 (H) 07/02/2016    DG Humerus Left  Result Date: 07/21/2019 CLINICAL DATA:  Fall EXAM: LEFT HUMERUS - 2+ VIEW COMPARISON:  None. FINDINGS: No fracture or dislocation of the left humerus. Included joint spaces are unremarkable. 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 Soft tissue edema. IMPRESSION: 1. No fracture or dislocation of the left humerus. 2. Dialysis fistula with associated vascular calcinosis. 3. Soft tissue edema. Electronically Signed   By: Eddie Candle M.D.   On: 07/21/2019 20:28    Assessment & Plan:   Follow-up: No follow-ups on file.  Walker Kehr, MD

## 2019-08-20 NOTE — Assessment & Plan Note (Signed)
Amlodipine, Pravastatin

## 2019-08-20 NOTE — Assessment & Plan Note (Signed)
Coumadin 

## 2019-08-22 ENCOUNTER — Other Ambulatory Visit: Payer: Self-pay

## 2019-08-22 ENCOUNTER — Other Ambulatory Visit (HOSPITAL_COMMUNITY): Payer: Self-pay | Admitting: *Deleted

## 2019-08-22 ENCOUNTER — Ambulatory Visit (INDEPENDENT_AMBULATORY_CARE_PROVIDER_SITE_OTHER): Payer: Medicare Other | Admitting: Pharmacist Clinician (PhC)/ Clinical Pharmacy Specialist

## 2019-08-22 DIAGNOSIS — I4891 Unspecified atrial fibrillation: Secondary | ICD-10-CM

## 2019-08-22 DIAGNOSIS — I480 Paroxysmal atrial fibrillation: Secondary | ICD-10-CM | POA: Diagnosis not present

## 2019-08-22 DIAGNOSIS — Z5181 Encounter for therapeutic drug level monitoring: Secondary | ICD-10-CM

## 2019-08-22 LAB — POCT INR: INR: 1.8 — AB (ref 2.0–3.0)

## 2019-08-22 MED ORDER — METOPROLOL TARTRATE 25 MG PO TABS: 12.5000 mg | ORAL_TABLET | Freq: Two times a day (BID) | ORAL | 3 refills | Status: DC

## 2019-08-24 ENCOUNTER — Ambulatory Visit
Admission: RE | Admit: 2019-08-24 | Discharge: 2019-08-24 | Disposition: A | Discharge status: Home / Self Care | Payer: Medicare Other | Source: Ambulatory Visit | Attending: Internal Medicine | Admitting: Internal Medicine

## 2019-08-24 DIAGNOSIS — J449 Chronic obstructive pulmonary disease, unspecified: Secondary | ICD-10-CM

## 2019-08-24 DIAGNOSIS — C3490 Malignant neoplasm of unspecified part of unspecified bronchus or lung: Secondary | ICD-10-CM | POA: Diagnosis not present

## 2019-08-24 DIAGNOSIS — C3411 Malignant neoplasm of upper lobe, right bronchus or lung: Secondary | ICD-10-CM

## 2019-08-24 DIAGNOSIS — R0600 Dyspnea, unspecified: Secondary | ICD-10-CM

## 2019-08-27 ENCOUNTER — Other Ambulatory Visit: Payer: Self-pay | Admitting: *Deleted

## 2019-08-27 NOTE — Patient Outreach (Signed)
[facility]) Care Management  08/27/2019  [name] [birth year] [id number]   RN Health Coach Monthly Outreach  Referral Date: 05/31/2019 Referral Source: Transfer from [place] Reason for Referral: Continued Disease Management Education Insurance:Medicare   Outreach Attempt:  Outreach attempt #1 to patient for follow up.  Patient answered and stated she could not speak at this time, requested call back another day.   Plan:  RN Health Coach will make another outreach attempt within the month of February per patient's request.  [name] Azure RN [place] [phone] [name]

## 2019-08-28 ENCOUNTER — Other Ambulatory Visit: Payer: Self-pay | Admitting: *Deleted

## 2019-08-28 NOTE — Patient Outreach (Signed)
[facility]) Care Management  08/28/2019  [name] [birth year] [id number]   RN Health Coach Monthly Outreach  Referral Date: 05/31/2019 Referral Source: Transfer from [place] Reason for Referral: Continued Disease Management Education Insurance:Medicare   Outreach Attempt: Outreach attempt #2 to patient for follow up.  Husband answered and stated patient was sleeping.  RN Health Coach left HIPAA compliant message with husband.    Plan:  RN Health Coach will make another outreach attempt within the month of March if no return call back from patient.  [place] [phone] [name]

## 2019-09-14 DIAGNOSIS — R600 Localized edema: Secondary | ICD-10-CM | POA: Diagnosis not present

## 2019-09-14 DIAGNOSIS — R809 Proteinuria, unspecified: Secondary | ICD-10-CM | POA: Diagnosis not present

## 2019-09-14 DIAGNOSIS — Z940 Kidney transplant status: Secondary | ICD-10-CM | POA: Diagnosis not present

## 2019-09-14 DIAGNOSIS — D849 Immunodeficiency, unspecified: Secondary | ICD-10-CM | POA: Diagnosis not present

## 2019-09-14 DIAGNOSIS — I10 Essential (primary) hypertension: Secondary | ICD-10-CM | POA: Diagnosis not present

## 2019-09-14 DIAGNOSIS — E119 Type 2 diabetes mellitus without complications: Secondary | ICD-10-CM | POA: Diagnosis not present

## 2019-09-19 ENCOUNTER — Encounter: Payer: Self-pay | Admitting: General Practice

## 2019-09-19 ENCOUNTER — Ambulatory Visit (INDEPENDENT_AMBULATORY_CARE_PROVIDER_SITE_OTHER): Payer: Medicare Other | Admitting: Pharmacist Clinician (PhC)/ Clinical Pharmacy Specialist

## 2019-09-19 ENCOUNTER — Other Ambulatory Visit: Payer: Self-pay

## 2019-09-19 ENCOUNTER — Ambulatory Visit (INDEPENDENT_AMBULATORY_CARE_PROVIDER_SITE_OTHER): Payer: Medicare Other | Admitting: General Practice

## 2019-09-19 VITALS — BP 150/88 | HR 57 | Ht 60.0 in | Wt 174.0 lb

## 2019-09-19 DIAGNOSIS — Z79899 Other long term (current) drug therapy: Secondary | ICD-10-CM | POA: Diagnosis not present

## 2019-09-19 DIAGNOSIS — I480 Paroxysmal atrial fibrillation: Secondary | ICD-10-CM

## 2019-09-19 DIAGNOSIS — I4891 Unspecified atrial fibrillation: Secondary | ICD-10-CM

## 2019-09-19 DIAGNOSIS — E785 Hyperlipidemia, unspecified: Secondary | ICD-10-CM | POA: Diagnosis not present

## 2019-09-19 DIAGNOSIS — I5033 Acute on chronic diastolic (congestive) heart failure: Secondary | ICD-10-CM

## 2019-09-19 DIAGNOSIS — Z952 Presence of prosthetic heart valve: Secondary | ICD-10-CM

## 2019-09-19 DIAGNOSIS — Z85118 Personal history of other malignant neoplasm of bronchus and lung: Secondary | ICD-10-CM

## 2019-09-19 DIAGNOSIS — Z5181 Encounter for therapeutic drug level monitoring: Secondary | ICD-10-CM | POA: Diagnosis not present

## 2019-09-19 LAB — POCT INR: INR: 2.5 (ref 2.0–3.0)

## 2019-09-19 MED ORDER — TORSEMIDE 20 MG PO TABS: 20.0000 mg | ORAL_TABLET | Freq: Every day | ORAL | 0 refills | Status: DC

## 2019-09-19 MED ORDER — POTASSIUM CHLORIDE CRYS ER 20 MEQ PO TBCR: 20.0000 meq | EXTENDED_RELEASE_TABLET | Freq: Every day | ORAL | 3 refills | Status: DC

## 2019-09-19 NOTE — Patient Instructions (Addendum)
Medication Instructions:  TAKE TORSEMIDE 20MG  x3 DAYS ONLY  TAKE POTASSIUM 20MEQ X3 DAYS ONLY  If you need a refill on your cardiac medications before your next appointment, please call your pharmacy.  Labwork: BMET 3 DAYS BEFORE FOLLOW UP APPOINTMENT HERE IN OUR OFFICE AT [facility]     You will NOT need to fast   If you have labs (blood work) drawn today and your tests are completely normal, you will receive your results only by: Marland Kitchen MyChart Message (if you have MyChart) OR A paper copy in the mail If you have any lab test that is abnormal or we need to change your treatment, we will call you to review these results. You may go to any [facility] lab that is convenient for you however, we do have a lab in our office that is able to assist you. You do NOT need an appointment for our lab. Once in our office in our office lobby there is a podium where you sign-in and ring the doorbell to alert [place] that you are here. Lab is open 8:00am and closes at 4:00pm; closes for lunch from 12:45 - 1:45pm. PLEASE BRING A COPY OF YOUR INSURANCE CARD WITH YOU.  Special Instructions: TAKE AND LOG YOUR DAILY WEIGHTS  PLEASE READ AND FOLLOW SALTY 6 ATTACHED  PLEASE PURCHASE AND WEAR COMPRESSION STOCKINGS DAILY AND OFF AT BEDTIME. Compression stockings are elastic socks that squeeze the legs. They help to increase blood flow to the legs and to decrease swelling in the legs from fluid retention, and reduce the chance of developing blood clots in the lower legs.   Follow-Up: 1 WEEK  In Person JESSE CLEAVER, FNP-C.    At [facility], you and your health needs are our priority.  As part of our continuing mission to provide you with exceptional heart care, we have created designated Provider Care Teams.  These Care Teams include your primary Cardiologist (physician) and Advanced Practice Providers (APPs -  Physician Assistants and Nurse Practitioners) who all work together to provide you with the care you need, when you  need it.  Reduce your risk of getting COVID-19 With your heart disease it is especially important for people at increased risk of severe illness from COVID-19, and those who live with them, to protect themselves from getting COVID-19. The best way to protect yourself and to help reduce the spread of the virus that causes COVID-19 is to: Marland Kitchen Limit your interactions with other people as much as possible. . Take COVID-19 when you do interact with others. If you start feeling sick and think you may have COVID-19, get in touch with your healthcare provider within 24 hours.  Thank you for choosing [facility] at [facility]!!

## 2019-09-19 NOTE — Progress Notes (Signed)
See pt findings

## 2019-09-19 NOTE — Progress Notes (Signed)
Cardiology Clinic Note   Patient Name: [name] Date of Encounter: 09/19/2019  Primary Care Provider:  [name], MD Primary Cardiologist:  [name] Ruths, MD  Patient Profile    [name] 74 year old female presents today for evaluation of her increased work of breathing.  Past Medical History    Past Medical History:  Diagnosis Date  . Acute CHF (congestive heart failure) ([name]) 12/2018  . Adenocarcinoma of lung, stage 1, right ([name]) 11/25/2017  . Anemia, iron deficiency    of chronic disease  . Aortic stenosis    a. Severe AS by echo 11/2012.  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 Asystole ([place])    a. During ENT surgery 2005: developed marked asystole requiring CPR, felt due to vagal reaction (cath nonobst dz).  . Carotid artery disease ([place])    a. Carotid Dopplers performed in August 2013 showed 40-59% left stenosis and 0-39% right; f/u recommended in 2 years.   . Cerebrovascular accident [facility]) 2009   a. LMCA infarct felt embolic 5329, maintained on chronic coumadin.; denies residual on 04/05/2013  . Cholelithiasis   . Chronic Persistent Atrial Fibrillation 12/31/2008   Qualifier: Diagnosis of  By: [name]    . Coronary artery disease 05/2002   a. Ant MI 2003 s/p PTCA/stent to RCA.   . Diverticulosis of colon   . Esophagitis, reflux   . ESRD (end stage renal disease) ([place])    a. Mass on L kidney per pt s/p nephrectomy - pt states not cancer - [facility] notes indicate ESRD due to HTN/DM - was previously on HD. b. Kidney transplant 02/2011.  Marland Kitchen GERD (gastroesophageal reflux disease)   . Gout   . Hearing loss   . Helicobacter pylori (H. pylori) infection    hx of  . Hemorrhoids   . Hx of colonic polyps    adenomatous  . Hyperlipidemia   . Hypertension   . Lung nodule seen on imaging study 04/07/2013   1.0 cm ground glass opacity RUL  . Myocardial infarction ([place]) 2003  . Pericardial effusion    a. Small by echo 11/2011.  . S/P  aortic valve replacement with bioprosthetic valve and maze procedure 04/12/2013   65mm [name] bovine pericardial tissue valve   . S/P Maze operation for atrial fibrillation 04/12/2013   Complete bilateral atrial lesion set using bipolar radiofrequency and cryothermy ablation with clipping of LA appendage  . Sleep apnea    Pt says testing was positive, intolerant of CPAP.  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  . Stroke ([place])   . Type II diabetes mellitus ([place])    Past Surgical History:  Procedure Laterality Date  . AORTIC VALVE REPLACEMENT N/A 04/12/2013   Procedure: AORTIC VALVE REPLACEMENT (AVR);  Surgeon: [name] Alberts, MD;  Location: [place];  Service: Open Heart Surgery;  Laterality: N/A;  . ARTERIOVENOUS GRAFT PLACEMENT Left   . ARTERIOVENOUS GRAFT PLACEMENT Left    "I've had 2 on my left; had one removed" (04/05/2013)   . ARTERY EXPLORATION Right 04/11/2013   Procedure: ARTERY EXPLORATION;  Surgeon: [name] Alberts, MD;  Location: [place];  Service: Open Heart Surgery;  Laterality: Right;  Right carotid artery exploration  . AV FISTULA PLACEMENT Right   . AV FISTULA REPAIR Right    "took it  out" ((/18/2014)  . CARDIOVERSION  05/29/2012   Procedure: CARDIOVERSION;  Surgeon: [name] Perla, MD;  Location: [facility];  Service: Cardiovascular;  Laterality: N/A;  . CHOLECYSTECTOMY  2009   with hernia removal  . CORONARY ANGIOPLASTY WITH STENT PLACEMENT Right    coronary artery  . INSERTION OF DIALYSIS CATHETER Bilateral    "over the years; took them both out" (04/05/2013)  . INTRAOPERATIVE TRANSESOPHAGEAL ECHOCARDIOGRAM N/A 04/11/2013   Procedure: INTRAOPERATIVE TRANSESOPHAGEAL ECHOCARDIOGRAM;  Surgeon: [name] Alberts, MD;  Location: [place];  Service: Open Heart Surgery;  Laterality: N/A;  . INTRAOPERATIVE  TRANSESOPHAGEAL ECHOCARDIOGRAM N/A 04/12/2013   Procedure: INTRAOPERATIVE TRANSESOPHAGEAL ECHOCARDIOGRAM;  Surgeon: [name] Alberts, MD;  Location: [place];  Service: Open Heart Surgery;  Laterality: N/A;  . KIDNEY TRANSPLANT  03/16/11  . LEFT AND RIGHT HEART CATHETERIZATION WITH CORONARY ANGIOGRAM N/A 04/06/2013   Procedure: LEFT AND RIGHT HEART CATHETERIZATION WITH CORONARY ANGIOGRAM;  Surgeon: [name] Ohara, MD;  Location: [facility] CATH LAB;  Service: Cardiovascular;  Laterality: N/A;  . MAZE N/A 04/12/2013   Procedure: MAZE;  Surgeon: [name] Alberts, MD;  Location: [place];  Service: Open Heart Surgery;  Laterality: N/A;  . NASAL RECONSTRUCTION WITH SEPTAL REPAIR     "took it out" (04/05/2013)  . NEPHRECTOMY Left 2010   no CA on bx  . TONSILLECTOMY    . TOTAL ABDOMINAL HYSTERECTOMY    . TUBAL LIGATION      Allergies  Allergies  Allergen Reactions  . Ibuprofen Nausea And Vomiting  . Sulfamethoxazole-Trimethoprim Itching, Swelling and Rash    Swelling of the face  . Sulfonamide Derivatives Itching, Swelling and Rash    Swelling of the face  . Tape Rash    Paper tape is ok  . Tramadol Nausea And Vomiting  . Doxycycline Nausea Only  . Hydrocil [Psyllium] Nausea And Vomiting  . Bactrim Itching, Swelling and Rash  . Red Dye Itching and Rash    History of Present Illness    [name] has a PMH of aortic stenosis status post AVR and atrial fibrillation.  She also has a history of embolic CVA.  She has also had a cardiac catheterization with PCI to her RCA.  Her catheterization  9/14 was for severe aortic stenosis.  Her right coronary artery was totally occluded however, no other coronary disease was seen.  She underwent AVR with pericardial tissue valve and also had a maze procedure.  Her carotid Dopplers 9/18 showed 1-39% bilateral stenosis.  Abdominal ultrasound 10/18 showed no abdominal aortic aneurysm and no focal stenosis of the aorta or iliac arteries.  She was noted to have abnormal ABIs  in her peripheral vascular disease is followed by [name].  A nuclear stress test 5/19 showed an EF of 59% and normal perfusion.  Her venous Dopplers 7/19 showed no DVT.  She is known to have a lung tumor but has declined resection.  She also did  receive radiation therapy.  Her echocardiogram 01/06/2019 showed an ejection fraction of 6065%, mild left ventricular enlargement, moderately elevated pulmonary pressure, biatrial enlargement, and a bioprosthetic aortic valve with mean gradient of 20 mmHg.  She was last seen by Dr. [name] virtually on 04/03/2019.  During that time she continued to have chronic dyspnea on exertion.  She denied chest pain, syncope, and had no lower extremity edema.  She was seen today in the office Coumadin clinic and was noted to have dyspnea on exertion.  She was also noted to  have left arm edema and right leg edema.  She states last week she presented to see Dr. Joelyn Oms nephrology who prescribed torsemide.  However she did not go to the pharmacy to fill the medication.  She is currently about 17 pounds up and states that she weighs herself daily and has accumulated the weight in the past week.  When asked about salt in her diet she stated that her husband prepares their food.  I will order torsemide 20 mg for 3 days along with potassium 20 mill equivalents x30 days have her return in 1 week for further evaluation and give her the salty 6 handout.  Today she denies chest pain,  palpitations, melena, hematuria, hemoptysis, diaphoresis, weakness, presyncope, and syncope.   Home Medications    Prior to Admission medications   Medication Sig Start Date End Date Taking? Authorizing Provider  acetaminophen (TYLENOL) 500 MG tablet Take 500 mg by mouth every 6 (six) hours as needed for mild pain or headache.    [provider]  amoxicillin (AMOXIL) 500 MG capsule TAKE 4 CAPSULES BY MOUTH 1 HOUR PRIOR TO DENTAL WORK 05/24/19   Plotnikov, [name], MD  dapsone 25 MG tablet  Take 25 mg by mouth daily.     [provider]  Fluticasone-Umeclidin-Vilant (TRELEGY ELLIPTA) 100-62.5-25 MCG/INH AEPB Inhale 1 Inhaler into the lungs daily. 08/20/19   Plotnikov, [name], MD  HYDROcodone-acetaminophen (NORCO/VICODIN) 5-325 MG tablet Take 1 tablet by mouth every 6 (six) hours as needed for severe pain. 08/20/19 08/19/20  Plotnikov, [name], MD  Insulin Glargine, 1 Unit Dial, (TOUJEO SOLOSTAR) 300 UNIT/ML SOPN Inject 24 Units into the skin every morning. Titrate up by 1 unit a day if needed for goal sugars of 100-130 up to 30 units a day max 03/14/19   Plotnikov, [name], MD  Insulin Pen Needle (B-D UF III MINI PEN NEEDLES) 31G X 5 MM MISC USE TO ADMINISTER INSULIN FOUR TIMES A DAY DX E11.9 11/11/17   Plotnikov, [name], MD  levothyroxine (SYNTHROID) 25 MCG tablet TAKE 2 TABLETS (50 MCG TOTAL) BY MOUTH DAILY. Patient taking differently: Take 50 mcg by mouth daily before breakfast.  12/18/18   Plotnikov, [name], MD  lisinopril (ZESTRIL) 5 MG tablet Take 1 tablet (5 mg total) by mouth daily. 06/11/19   Plotnikov, [name], MD  metoprolol tartrate (LOPRESSOR) 25 MG tablet Take 0.5 tablets (12.5 mg total) by mouth 2 (two) times daily. 08/22/19   Sherran Needs, NP  Multiple Vitamins-Minerals (CENTRUM SILVER 50+WOMEN) TABS Take 1 tablet by mouth daily with breakfast.    [provider]  NON FORMULARY Apply 1 application topically See admin instructions. Hempvana Pain Relief Cream: Apply to painful sites as needed    [provider]  [facility] DELICA LANCETS 50K MISC Use to check blood sugars three times a day DX E11.9 10/12/17   Plotnikov, [name], MD  ONETOUCH VERIO test strip USE TO TEST 3 TIMES DAILY. DX E11.9 Patient taking differently: 1 each by Other route 3 (three) times daily. DX: E11.9 10/20/18   Hoyt Koch, MD  OXYGEN Inhale 3 L into the lungs as needed (for shortness of breath).     [provider]  predniSONE (DELTASONE) 5 MG  tablet Take 5 mg by mouth daily.  03/21/18   [provider]  Propylene Glycol (SYSTANE BALANCE) 0.6 % SOLN Place 1-2 drops into both eyes 3 (three) times daily as needed (for dryness).    [provider]  repaglinide (PRANDIN) 0.5 MG tablet Take 1 tablet (0.5 mg total) by mouth 3 (three) times daily before meals. 06/13/19   Plotnikov, [name], MD  rosuvastatin (CRESTOR) 20 MG tablet Take 1 tablet (20 mg total) by mouth daily. 03/21/19   [name] Perla, MD  spironolactone (ALDACTONE) 25 MG tablet Take 1 tablet (25 mg total) by mouth daily. 12/27/18   Pokhrel, Corrie Mckusick, MD  tacrolimus (PROGRAF) 1 MG capsule Take 3 mg by mouth 2 (two) times daily.     [provider]  warfarin (COUMADIN) 5 MG tablet Take 1/2 to 1 tablet by mouth daily as directed by coumadin clinic. 05/22/19   [name] Perla, MD    Family History    Family History  Problem Relation Age of Onset  . Stroke Father   . Hypertension Mother   . Diabetes Other   . Diabetes Maternal Grandmother   . Diabetes Son   . Crohn's disease Other        grandson   . Breast cancer Neg Hx   . Stomach cancer Neg Hx   . Esophageal cancer Neg Hx   . Colon cancer Neg Hx   . Pancreatic cancer Neg Hx    She indicated that her mother is deceased. She indicated that her father is deceased. She indicated that her maternal grandmother is deceased. She indicated that her maternal grandfather is deceased. She indicated that her paternal grandmother is deceased. She indicated that her paternal grandfather is deceased. She indicated that the status of her son is unknown. She indicated that the status of her neg hx is unknown.  Social History    Social History   Socioeconomic History  . Marital status: Married    Spouse name: Not on file  . Number of children: 5  . Years of education: 16  . Highest education level: Not on file  Occupational History  . Occupation: retired    Fish farm manager: RETIRED  Tobacco Use  . Smoking  status: Former Smoker    Packs/day: 1.00    Years: 30.00    Pack years: 30.00    Types: Cigarettes    Quit date: 07/19/2001    Years since quitting: 18.1  . Smokeless tobacco: Never Used  Substance and Sexual Activity  . Alcohol use: No  . Drug use: No  . Sexual activity: Not Currently  Other Topics Concern  . Not on file  Social History Narrative   Patient signed a Designated Party Release to allow her spouse [name] and family and five children to have access to her medical records/information.     Patient lives with husband in a 2 story home.  Has 5 children. Retired from Personal assistant.  Education: 3 years of college.    Social Determinants of Health   Financial Resource Strain:   . Difficulty of Paying Living Expenses: Not on file  Food Insecurity: No Food Insecurity  . Worried About Charity fundraiser in the Last Year: Never true  . Ran Out of Food in the Last Year: Never true  Transportation Needs: No Transportation Needs  . Lack of Transportation (Medical): No  . Lack of Transportation (Non-Medical): No  Physical Activity: Inactive  . Days of Exercise per Week: 0 days  . Minutes of Exercise per Session: 0 min  Stress: Stress Concern Present  . Feeling of Stress : Rather much  Social Connections:   . Frequency of Communication with Friends and Family: Not on file  .  Frequency of Social Gatherings with Friends and Family: Not on file  . Attends Religious Services: Not on file  . Active Member of Clubs or Organizations: Not on file  . Attends Archivist Meetings: Not on file  . Marital Status: Not on file  Intimate Partner Violence: Not At Risk  . Fear of Current or Ex-Partner: No  . Emotionally Abused: No  . Physically Abused: No  . Sexually Abused: No     Review of Systems    General:  No chills, fever, night sweats or weight changes.  Cardiovascular:  No chest pain, dyspnea on exertion, edema, orthopnea, palpitations, paroxysmal nocturnal  dyspnea. Dermatological: No rash, lesions/masses Respiratory: No cough, dyspnea Urologic: No hematuria, dysuria Abdominal:   No nausea, vomiting, diarrhea, bright red blood per rectum, melena, or hematemesis Neurologic:  No visual changes, wkns, changes in mental status. All other systems reviewed and are otherwise negative except as noted above.  Physical Exam    VS:  BP (!) 150/88   Pulse (!) 57   Ht 5' (1.524 m)   Wt 174 lb (78.9 kg)   SpO2 92%   BMI 33.98 kg/m  , BMI Body mass index is 33.98 kg/m. GEN: Well nourished, well developed, in no acute distress. HEENT: normal. Neck: Supple, no JVD, carotid bruits, or masses. Cardiac: RRR, no murmurs, rubs, or gallops. No clubbing, cyanosis, left upper extremity pitting edema and right lower extremity +2 edema.  Radials/DP/PT 2+ and equal bilaterally.  Respiratory:  Respirations regular and unlabored, clear to auscultation bilaterally. GI: Soft, nontender, nondistended, BS + x 4. MS: no deformity or atrophy. Skin: warm and dry, no rash. Neuro:  Strength and sensation are intact. Psych: Normal affect.  Accessory Clinical Findings    ECG personally reviewed by me today-sinus bradycardia anterior infarct undetermined age 5 bpm-  EKG 12/21/2018 Atrial fibrillation with premature ventricular and aberrantly conducted complexes 79 bpm  Echocardiogram 12/20/2018 IMPRESSIONS    1. The left ventricle has normal systolic function with an ejection  fraction of 60-65%. The cavity size was mildly dilated. Mild basal septal  hyperteophy. Left ventricular diastolic function could not be evaluated  due to nondiagnostic images. No  evidence of left ventricular regional wall motion abnormalities.  2. The right ventricle has normal systolic function. The cavity was  mildly enlarged. There is no increase in right ventricular wall thickness.  Right ventricular systolic pressure is moderately elevated with an  estimated pressure of 44.1 mmHg.   3. Left atrial size was severely dilated.  4. Right atrial size was moderately dilated.  5. Stable AV bioprosthesis that appears to be functioning normally. The  mean AV gradient is 45mmHg which is down from 25mmHg a year ago. There is  no perivalvular AI.  6. The mitral valve is degenerative. Mild thickening of the anterior  mitral valve leaflet. Mild calcification of the anterior mitral valve  leaflet. There is moderate to severe mitral annular calcification present.  7. The inferior vena cava was dilated in size with <50% respiratory  variability.  8. Compared to last echo the PASP has decreased from 39mmHg to 64mmHg.  Assessment & Plan   1.  Acute on chronic diastolic heart failure-weight today 174 pounds up from 157 pounds on 08/20/2019.  Left upper extremity edema and right lower extremity +2 pitting edema.  Continues to use 3 L of oxygen.  Oxygen saturation 92%. Order torsemide 20 mg x 3 days Order potassium 20 mEq x 3 days then stop  Heart healthy low-sodium diet-salty 6 given Daily weights Lower extremity support stockings Elevate extremities when not active BMP in 1 week  Essential hypertension-BP today 150/88. Continue metoprolol tartrate 12.5 mg twice daily Continue spironolactone 25 mg daily Heart healthy low-sodium diet Increase physical activity as tolerated  History of aortic valve replacement-AVR 04/12/2013 with Maze procedure.  Echocardiogram 12/20/2018 showed stable AV bioprosthesis that appears to be functioning normally.  Coronary artery disease-no chest pain today. No aspirin Continue warfarin as directed Continue rosuvastatin 20 mg tablet daily Heart healthy low-sodium diet Increase physical activity as tolerated  Paroxysmal atrial fibrillation-EKG today shows sinus bradycardia anterior infarct undetermined age 14 bpm Continue warfarin Continue metoprolol tartrate 12.5 mg twice daily Avoid triggers caffeine, chocolate, EtOH, etc.  History of lung  cancer-continues on 3 L today 92% oxygen saturation Followed by oncology  Disposition: Follow-up with me in 1 week.  Jossie Ng. [facility] [place] [address] Office [phone] Fax [phone]

## 2019-09-24 ENCOUNTER — Ambulatory Visit: Payer: Medicare Other | Admitting: Physician Assistant

## 2019-09-28 DIAGNOSIS — Z940 Kidney transplant status: Secondary | ICD-10-CM | POA: Diagnosis not present

## 2019-10-01 ENCOUNTER — Encounter (HOSPITAL_COMMUNITY): Payer: Self-pay

## 2019-10-01 ENCOUNTER — Other Ambulatory Visit: Payer: Self-pay | Admitting: *Deleted

## 2019-10-01 ENCOUNTER — Inpatient Hospital Stay (HOSPITAL_COMMUNITY)
Admission: EM | Admit: 2019-10-01 | Discharge: 2019-10-11 | DRG: 698 | Disposition: A | Discharge status: Home / Self Care | Payer: Medicare Other | Attending: Internal Medicine | Admitting: Internal Medicine

## 2019-10-01 ENCOUNTER — Other Ambulatory Visit: Payer: Self-pay

## 2019-10-01 ENCOUNTER — Emergency Department (HOSPITAL_COMMUNITY): Payer: Medicare Other

## 2019-10-01 DIAGNOSIS — Z8719 Personal history of other diseases of the digestive system: Secondary | ICD-10-CM

## 2019-10-01 DIAGNOSIS — Y830 Surgical operation with transplant of whole organ as the cause of abnormal reaction of the patient, or of later complication, without mention of misadventure at the time of the procedure: Secondary | ICD-10-CM | POA: Diagnosis present

## 2019-10-01 DIAGNOSIS — J9611 Chronic respiratory failure with hypoxia: Secondary | ICD-10-CM | POA: Diagnosis not present

## 2019-10-01 DIAGNOSIS — I081 Rheumatic disorders of both mitral and tricuspid valves: Secondary | ICD-10-CM | POA: Diagnosis present

## 2019-10-01 DIAGNOSIS — C3491 Malignant neoplasm of unspecified part of right bronchus or lung: Secondary | ICD-10-CM | POA: Diagnosis present

## 2019-10-01 DIAGNOSIS — I252 Old myocardial infarction: Secondary | ICD-10-CM

## 2019-10-01 DIAGNOSIS — I130 Hypertensive heart and chronic kidney disease with heart failure and stage 1 through stage 4 chronic kidney disease, or unspecified chronic kidney disease: Secondary | ICD-10-CM | POA: Diagnosis present

## 2019-10-01 DIAGNOSIS — Z940 Kidney transplant status: Secondary | ICD-10-CM | POA: Diagnosis not present

## 2019-10-01 DIAGNOSIS — Z9049 Acquired absence of other specified parts of digestive tract: Secondary | ICD-10-CM

## 2019-10-01 DIAGNOSIS — Z888 Allergy status to other drugs, medicaments and biological substances status: Secondary | ICD-10-CM

## 2019-10-01 DIAGNOSIS — I480 Paroxysmal atrial fibrillation: Secondary | ICD-10-CM | POA: Diagnosis not present

## 2019-10-01 DIAGNOSIS — N179 Acute kidney failure, unspecified: Secondary | ICD-10-CM | POA: Diagnosis present

## 2019-10-01 DIAGNOSIS — I5033 Acute on chronic diastolic (congestive) heart failure: Secondary | ICD-10-CM | POA: Diagnosis not present

## 2019-10-01 DIAGNOSIS — I509 Heart failure, unspecified: Secondary | ICD-10-CM | POA: Diagnosis not present

## 2019-10-01 DIAGNOSIS — Z794 Long term (current) use of insulin: Secondary | ICD-10-CM

## 2019-10-01 DIAGNOSIS — E1159 Type 2 diabetes mellitus with other circulatory complications: Secondary | ICD-10-CM | POA: Diagnosis not present

## 2019-10-01 DIAGNOSIS — Z85118 Personal history of other malignant neoplasm of bronchus and lung: Secondary | ICD-10-CM

## 2019-10-01 DIAGNOSIS — Z833 Family history of diabetes mellitus: Secondary | ICD-10-CM

## 2019-10-01 DIAGNOSIS — Z79899 Other long term (current) drug therapy: Secondary | ICD-10-CM

## 2019-10-01 DIAGNOSIS — E785 Hyperlipidemia, unspecified: Secondary | ICD-10-CM | POA: Diagnosis present

## 2019-10-01 DIAGNOSIS — T8619 Other complication of kidney transplant: Secondary | ICD-10-CM | POA: Diagnosis not present

## 2019-10-01 DIAGNOSIS — G4730 Sleep apnea, unspecified: Secondary | ICD-10-CM | POA: Diagnosis present

## 2019-10-01 DIAGNOSIS — Z90710 Acquired absence of both cervix and uterus: Secondary | ICD-10-CM

## 2019-10-01 DIAGNOSIS — Z20822 Contact with and (suspected) exposure to covid-19: Secondary | ICD-10-CM | POA: Diagnosis present

## 2019-10-01 DIAGNOSIS — D631 Anemia in chronic kidney disease: Secondary | ICD-10-CM | POA: Diagnosis present

## 2019-10-01 DIAGNOSIS — I10 Essential (primary) hypertension: Secondary | ICD-10-CM

## 2019-10-01 DIAGNOSIS — I110 Hypertensive heart disease with heart failure: Secondary | ICD-10-CM | POA: Diagnosis not present

## 2019-10-01 DIAGNOSIS — E1169 Type 2 diabetes mellitus with other specified complication: Secondary | ICD-10-CM | POA: Diagnosis present

## 2019-10-01 DIAGNOSIS — Z881 Allergy status to other antibiotic agents status: Secondary | ICD-10-CM

## 2019-10-01 DIAGNOSIS — N3001 Acute cystitis with hematuria: Secondary | ICD-10-CM | POA: Diagnosis not present

## 2019-10-01 DIAGNOSIS — E1121 Type 2 diabetes mellitus with diabetic nephropathy: Secondary | ICD-10-CM | POA: Diagnosis not present

## 2019-10-01 DIAGNOSIS — Z8249 Family history of ischemic heart disease and other diseases of the circulatory system: Secondary | ICD-10-CM

## 2019-10-01 DIAGNOSIS — I152 Hypertension secondary to endocrine disorders: Secondary | ICD-10-CM | POA: Diagnosis present

## 2019-10-01 DIAGNOSIS — Z86718 Personal history of other venous thrombosis and embolism: Secondary | ICD-10-CM

## 2019-10-01 DIAGNOSIS — R0602 Shortness of breath: Secondary | ICD-10-CM | POA: Diagnosis not present

## 2019-10-01 DIAGNOSIS — Z7901 Long term (current) use of anticoagulants: Secondary | ICD-10-CM

## 2019-10-01 DIAGNOSIS — Z923 Personal history of irradiation: Secondary | ICD-10-CM

## 2019-10-01 DIAGNOSIS — M7989 Other specified soft tissue disorders: Secondary | ICD-10-CM | POA: Diagnosis present

## 2019-10-01 DIAGNOSIS — E1122 Type 2 diabetes mellitus with diabetic chronic kidney disease: Secondary | ICD-10-CM | POA: Diagnosis present

## 2019-10-01 DIAGNOSIS — Z8674 Personal history of sudden cardiac arrest: Secondary | ICD-10-CM

## 2019-10-01 DIAGNOSIS — Z823 Family history of stroke: Secondary | ICD-10-CM

## 2019-10-01 DIAGNOSIS — I2510 Atherosclerotic heart disease of native coronary artery without angina pectoris: Secondary | ICD-10-CM | POA: Diagnosis present

## 2019-10-01 DIAGNOSIS — N059 Unspecified nephritic syndrome with unspecified morphologic changes: Secondary | ICD-10-CM | POA: Diagnosis present

## 2019-10-01 DIAGNOSIS — Z953 Presence of xenogenic heart valve: Secondary | ICD-10-CM

## 2019-10-01 DIAGNOSIS — J449 Chronic obstructive pulmonary disease, unspecified: Secondary | ICD-10-CM | POA: Diagnosis present

## 2019-10-01 DIAGNOSIS — I69920 Aphasia following unspecified cerebrovascular disease: Secondary | ICD-10-CM

## 2019-10-01 DIAGNOSIS — N184 Chronic kidney disease, stage 4 (severe): Secondary | ICD-10-CM | POA: Diagnosis present

## 2019-10-01 DIAGNOSIS — Z87891 Personal history of nicotine dependence: Secondary | ICD-10-CM

## 2019-10-01 DIAGNOSIS — E1142 Type 2 diabetes mellitus with diabetic polyneuropathy: Secondary | ICD-10-CM | POA: Diagnosis present

## 2019-10-01 DIAGNOSIS — I272 Pulmonary hypertension, unspecified: Secondary | ICD-10-CM | POA: Diagnosis present

## 2019-10-01 DIAGNOSIS — Z79890 Hormone replacement therapy (postmenopausal): Secondary | ICD-10-CM

## 2019-10-01 DIAGNOSIS — I4819 Other persistent atrial fibrillation: Secondary | ICD-10-CM | POA: Diagnosis present

## 2019-10-01 DIAGNOSIS — E876 Hypokalemia: Secondary | ICD-10-CM | POA: Diagnosis not present

## 2019-10-01 DIAGNOSIS — N183 Chronic kidney disease, stage 3 unspecified: Secondary | ICD-10-CM | POA: Diagnosis present

## 2019-10-01 DIAGNOSIS — Z7952 Long term (current) use of systemic steroids: Secondary | ICD-10-CM

## 2019-10-01 DIAGNOSIS — E039 Hypothyroidism, unspecified: Secondary | ICD-10-CM | POA: Diagnosis present

## 2019-10-01 DIAGNOSIS — E1129 Type 2 diabetes mellitus with other diabetic kidney complication: Secondary | ICD-10-CM | POA: Diagnosis present

## 2019-10-01 DIAGNOSIS — Z955 Presence of coronary angioplasty implant and graft: Secondary | ICD-10-CM

## 2019-10-01 DIAGNOSIS — Z9981 Dependence on supplemental oxygen: Secondary | ICD-10-CM

## 2019-10-01 DIAGNOSIS — D84821 Immunodeficiency due to drugs: Secondary | ICD-10-CM | POA: Diagnosis present

## 2019-10-01 LAB — URINALYSIS, ROUTINE W REFLEX MICROSCOPIC
Bacteria, UA: NONE SEEN
Bilirubin Urine: NEGATIVE
Glucose, UA: 50 mg/dL — AB
Ketones, ur: NEGATIVE mg/dL
Nitrite: NEGATIVE
Protein, ur: >=300 mg/dL — AB
Specific Gravity, Urine: 1.01 (ref 1.005–1.030)
WBC, UA: >50 WBC/hpf — ABNORMAL HIGH (ref 0–5)
pH: 6 (ref 5.0–8.0)

## 2019-10-01 LAB — BASIC METABOLIC PANEL
Anion gap: 14 (ref 5–15)
BUN: 69 mg/dL — ABNORMAL HIGH (ref 8–23)
CO2: 23 mmol/L (ref 22–32)
Calcium: 9.4 mg/dL (ref 8.9–10.3)
Chloride: 101 mmol/L (ref 98–111)
Creatinine, Ser: 2.92 mg/dL — ABNORMAL HIGH (ref 0.44–1.00)
GFR calc Af Amer: 17 mL/min — ABNORMAL LOW (ref >60)
GFR calc non Af Amer: 15 mL/min — ABNORMAL LOW (ref >60)
Glucose, Bld: 326 mg/dL — ABNORMAL HIGH (ref 70–99)
Potassium: 3.7 mmol/L (ref 3.5–5.1)
Sodium: 138 mmol/L (ref 135–145)

## 2019-10-01 LAB — CBC
HCT: 39.8 % (ref 36.0–46.0)
Hemoglobin: 11.5 g/dL — ABNORMAL LOW (ref 12.0–15.0)
MCH: 25.2 pg — ABNORMAL LOW (ref 26.0–34.0)
MCHC: 28.9 g/dL — ABNORMAL LOW (ref 30.0–36.0)
MCV: 87.1 fL (ref 80.0–100.0)
Platelets: 237 10*3/uL (ref 150–400)
RBC: 4.57 MIL/uL (ref 3.87–5.11)
RDW: 20.7 % — ABNORMAL HIGH (ref 11.5–15.5)
WBC: 6.6 10*3/uL (ref 4.0–10.5)
nRBC: 0 % (ref 0.0–0.2)

## 2019-10-01 LAB — MAGNESIUM: Magnesium: 2.2 mg/dL (ref 1.7–2.4)

## 2019-10-01 LAB — PROTIME-INR
INR: 2 — ABNORMAL HIGH (ref 0.8–1.2)
Prothrombin Time: 22.1 seconds — ABNORMAL HIGH (ref 11.4–15.2)

## 2019-10-01 LAB — BRAIN NATRIURETIC PEPTIDE: B Natriuretic Peptide: 2092.5 pg/mL — ABNORMAL HIGH (ref 0.0–100.0)

## 2019-10-01 LAB — CBG MONITORING, ED: Glucose-Capillary: 247 mg/dL — ABNORMAL HIGH (ref 70–99)

## 2019-10-01 MED ORDER — ROSUVASTATIN CALCIUM 20 MG PO TABS
20.0000 mg | ORAL_TABLET | ORAL | Status: DC
  Administered 2019-10-02 – 2019-10-10 (×5): 20 mg via ORAL
  Filled 2019-10-01: qty 4
  Filled 2019-10-01 (×6): qty 1

## 2019-10-01 MED ORDER — FUROSEMIDE 10 MG/ML IJ SOLN
40.0000 mg | Freq: Two times a day (BID) | INTRAMUSCULAR | Status: DC
  Administered 2019-10-02 – 2019-10-03 (×3): 40 mg via INTRAVENOUS
  Filled 2019-10-01 (×3): qty 4

## 2019-10-01 MED ORDER — WARFARIN - PHARMACIST DOSING INPATIENT: Freq: Every day | Status: DC

## 2019-10-01 MED ORDER — BISACODYL 5 MG PO TBEC
5.0000 mg | DELAYED_RELEASE_TABLET | Freq: Every day | ORAL | Status: DC | PRN
  Administered 2019-10-02: 5 mg via ORAL
  Filled 2019-10-01: qty 1

## 2019-10-01 MED ORDER — WARFARIN SODIUM 2.5 MG PO TABS: 2.5000 mg | ORAL_TABLET | ORAL | Status: DC

## 2019-10-01 MED ORDER — INSULIN ASPART 100 UNIT/ML ~~LOC~~ SOLN
0.0000 [IU] | Freq: Every day | SUBCUTANEOUS | Status: DC
  Administered 2019-10-01 – 2019-10-06 (×3): 2 [IU] via SUBCUTANEOUS
  Administered 2019-10-07 – 2019-10-10 (×2): 3 [IU] via SUBCUTANEOUS

## 2019-10-01 MED ORDER — SODIUM CHLORIDE 0.9 % IV BOLUS
500.0000 mL | Freq: Once | INTRAVENOUS | Status: AC
  Administered 2019-10-01: 500 mL via INTRAVENOUS

## 2019-10-01 MED ORDER — SODIUM CHLORIDE 0.9 % IV SOLN
250.0000 mL | INTRAVENOUS | Status: DC | PRN
  Administered 2019-10-02: 250 mL via INTRAVENOUS
  Administered 2019-10-07: 30 mL via INTRAVENOUS

## 2019-10-01 MED ORDER — POLYVINYL ALCOHOL 1.4 % OP SOLN: 1.0000 [drp] | Freq: Three times a day (TID) | OPHTHALMIC | Status: DC | PRN

## 2019-10-01 MED ORDER — POLYETHYLENE GLYCOL 3350 17 G PO PACK
17.0000 g | PACK | Freq: Every day | ORAL | Status: DC | PRN
  Administered 2019-10-02: 17 g via ORAL
  Filled 2019-10-01: qty 1

## 2019-10-01 MED ORDER — SODIUM CHLORIDE 0.9% FLUSH: 3.0000 mL | INTRAVENOUS | Status: DC | PRN

## 2019-10-01 MED ORDER — PREDNISONE 5 MG PO TABS
5.0000 mg | ORAL_TABLET | Freq: Every day | ORAL | Status: DC
  Administered 2019-10-02 – 2019-10-11 (×10): 5 mg via ORAL
  Filled 2019-10-01 (×10): qty 1

## 2019-10-01 MED ORDER — SODIUM CHLORIDE 0.9 % IV SOLN
1.0000 g | INTRAVENOUS | Status: DC
  Administered 2019-10-02: 1 g via INTRAVENOUS
  Filled 2019-10-01: qty 10

## 2019-10-01 MED ORDER — HYDROCODONE-ACETAMINOPHEN 5-325 MG PO TABS: 1.0000 | ORAL_TABLET | Freq: Four times a day (QID) | ORAL | Status: DC | PRN

## 2019-10-01 MED ORDER — WARFARIN SODIUM 5 MG PO TABS
5.0000 mg | ORAL_TABLET | ORAL | Status: DC
  Administered 2019-10-02: 5 mg via ORAL
  Filled 2019-10-01 (×2): qty 1

## 2019-10-01 MED ORDER — ONDANSETRON HCL 4 MG/2ML IJ SOLN: 4.0000 mg | Freq: Four times a day (QID) | INTRAMUSCULAR | Status: DC | PRN

## 2019-10-01 MED ORDER — SODIUM CHLORIDE 0.9 % IV SOLN
1.0000 g | Freq: Once | INTRAVENOUS | Status: AC
  Administered 2019-10-01: 1 g via INTRAVENOUS
  Filled 2019-10-01: qty 10

## 2019-10-01 MED ORDER — SPIRONOLACTONE 25 MG PO TABS
25.0000 mg | ORAL_TABLET | Freq: Every day | ORAL | Status: DC
  Administered 2019-10-02: 25 mg via ORAL
  Filled 2019-10-01 (×2): qty 1

## 2019-10-01 MED ORDER — FUROSEMIDE 10 MG/ML IJ SOLN
40.0000 mg | Freq: Once | INTRAMUSCULAR | Status: AC
  Administered 2019-10-01: 40 mg via INTRAVENOUS
  Filled 2019-10-01: qty 4

## 2019-10-01 MED ORDER — WARFARIN SODIUM 2.5 MG PO TABS
2.5000 mg | ORAL_TABLET | Freq: Once | ORAL | Status: AC
  Administered 2019-10-02: 2.5 mg via ORAL
  Filled 2019-10-01: qty 1

## 2019-10-01 MED ORDER — METOPROLOL TARTRATE 12.5 MG HALF TABLET
12.5000 mg | ORAL_TABLET | Freq: Two times a day (BID) | ORAL | Status: DC
  Administered 2019-10-01 – 2019-10-11 (×20): 12.5 mg via ORAL
  Filled 2019-10-01 (×20): qty 1

## 2019-10-01 MED ORDER — LEVOTHYROXINE SODIUM 50 MCG PO TABS
50.0000 ug | ORAL_TABLET | Freq: Every day | ORAL | Status: DC
  Administered 2019-10-02 – 2019-10-11 (×10): 50 ug via ORAL
  Filled 2019-10-01 (×10): qty 1

## 2019-10-01 MED ORDER — SODIUM CHLORIDE 0.9% FLUSH
3.0000 mL | Freq: Two times a day (BID) | INTRAVENOUS | Status: DC
  Administered 2019-10-01 – 2019-10-11 (×20): 3 mL via INTRAVENOUS

## 2019-10-01 MED ORDER — DAPSONE 25 MG PO TABS
25.0000 mg | ORAL_TABLET | Freq: Every day | ORAL | Status: DC
  Administered 2019-10-02 – 2019-10-11 (×10): 25 mg via ORAL
  Filled 2019-10-01 (×10): qty 1

## 2019-10-01 MED ORDER — ACETAMINOPHEN 325 MG PO TABS: 650.0000 mg | ORAL_TABLET | ORAL | Status: DC | PRN

## 2019-10-01 MED ORDER — TACROLIMUS 1 MG PO CAPS
3.0000 mg | ORAL_CAPSULE | Freq: Two times a day (BID) | ORAL | Status: DC
  Administered 2019-10-02 (×2): 3 mg via ORAL
  Filled 2019-10-01 (×2): qty 3

## 2019-10-01 MED ORDER — INSULIN GLARGINE 100 UNIT/ML ~~LOC~~ SOLN
30.0000 [IU] | Freq: Every day | SUBCUTANEOUS | Status: DC
  Administered 2019-10-02 – 2019-10-11 (×10): 30 [IU] via SUBCUTANEOUS
  Filled 2019-10-01 (×11): qty 0.3

## 2019-10-01 MED ORDER — INSULIN ASPART 100 UNIT/ML ~~LOC~~ SOLN
0.0000 [IU] | Freq: Three times a day (TID) | SUBCUTANEOUS | Status: DC
  Administered 2019-10-03 – 2019-10-04 (×3): 2 [IU] via SUBCUTANEOUS
  Administered 2019-10-04: 8 [IU] via SUBCUTANEOUS
  Administered 2019-10-05: 2 [IU] via SUBCUTANEOUS
  Administered 2019-10-05 (×2): 3 [IU] via SUBCUTANEOUS
  Administered 2019-10-06: 5 [IU] via SUBCUTANEOUS
  Administered 2019-10-07: 3 [IU] via SUBCUTANEOUS
  Administered 2019-10-07 – 2019-10-08 (×3): 2 [IU] via SUBCUTANEOUS
  Administered 2019-10-08: 3 [IU] via SUBCUTANEOUS
  Administered 2019-10-09: 5 [IU] via SUBCUTANEOUS
  Administered 2019-10-10: 2 [IU] via SUBCUTANEOUS
  Administered 2019-10-10 – 2019-10-11 (×4): 3 [IU] via SUBCUTANEOUS

## 2019-10-01 NOTE — Progress Notes (Signed)
ANTICOAGULATION CONSULT NOTE - Initial Consult  Pharmacy Consult for Warfarin  Indication: atrial fibrillation  Allergies  Allergen Reactions  . Ibuprofen Nausea And Vomiting  . Sulfamethoxazole-Trimethoprim Itching, Swelling and Rash    Swelling of the face  . Sulfonamide Derivatives Itching, Swelling and Rash    Swelling of the face  . Tape Rash    Paper tape is ok  . Tramadol Nausea And Vomiting  . Doxycycline Nausea Only  . Hydrocil [Psyllium] Nausea And Vomiting  . Bactrim Itching, Swelling and Rash  . Red Dye Itching and Rash    Patient Measurements: Height: 5' (152.4 cm) Weight: 180 lb (81.6 kg) IBW/kg (Calculated) : 45.5  Vital Signs: Temp: 97.9 F (36.6 C) (03/15 1713) Temp Source: Oral (03/15 1713) BP: 104/75 (03/15 2300) Pulse Rate: 47 (03/15 2200)  Labs: Recent Labs    10/01/19 1724 10/01/19 2125  HGB 11.5*  --   HCT 39.8  --   PLT 237  --   LABPROT  --  22.1*  INR  --  2.0*  CREATININE 2.92*  --     Estimated Creatinine Clearance: 15.7 mL/min (A) (by C-G formula based on SCr of 2.92 mg/dL (H)).   Medical History: Past Medical History:  Diagnosis Date  . Acute CHF (congestive heart failure) ([place]) 12/2018  . Adenocarcinoma of lung, stage 1, right ([place]) 11/25/2017  . Anemia, iron deficiency    of chronic disease  . Aortic stenosis    a. Severe AS by echo 11/2012.  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 Asystole ([place])    a. During ENT surgery 2005: developed marked asystole requiring CPR, felt due to vagal reaction (cath nonobst dz).  . Carotid artery disease ([place])    a. Carotid Dopplers performed in August 2013 showed 40-59% left stenosis and 0-39% right; f/u recommended in 2 years.   . Cerebrovascular accident [facility]) 2009   a. LMCA infarct felt embolic 2993, maintained on chronic coumadin.; denies residual on 04/05/2013  . Cholelithiasis   . Chronic Persistent Atrial Fibrillation 12/31/2008   Qualifier: Diagnosis of  By:  [name]    . Coronary artery disease 05/2002   a. Ant MI 2003 s/p PTCA/stent to RCA.   . Diverticulosis of colon   . Esophagitis, reflux   . ESRD (end stage renal disease) ([place])    a. Mass on L kidney per pt s/p nephrectomy - pt states not cancer - [facility] notes indicate ESRD due to HTN/DM - was previously on HD. b. Kidney transplant 02/2011.  Marland Kitchen GERD (gastroesophageal reflux disease)   . Gout   . Hearing loss   . Helicobacter pylori (H. pylori) infection    hx of  . Hemorrhoids   . Hx of colonic polyps    adenomatous  . Hyperlipidemia   . Hypertension   . Lung nodule seen on imaging study 04/07/2013   1.0 cm ground glass opacity RUL  . Myocardial infarction ([place]) 2003  . Pericardial effusion    a. Small by echo 11/2011.  . S/P aortic valve replacement with bioprosthetic valve and maze procedure 04/12/2013   37mm [facility] Ease bovine pericardial tissue valve   . S/P Maze operation for atrial fibrillation 04/12/2013   Complete bilateral atrial lesion set using bipolar radiofrequency and cryothermy ablation with clipping of LA appendage  . Sleep apnea    Pt says testing was positive, intolerant of CPAP.  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  . Stroke ([place])   . Type II diabetes mellitus [facility])    Assessment: 74 y/o F with chief complaint of bilateral lower extremity swelling, on warfarin PTA for afib, INR is within goal at 2 (goal 2-3). Hgb 11.5.   Warfarin regimen per outpatient anti-coag notes: 2.5 mg on Mondays, 5 mg all other days  Goal of Therapy:  INR 2-3 Monitor platelets by anticoagulation protocol: Yes   Plan:  -Cont warfarin per outpatient regimen  -Daily PT/INR -Monitor for bleeding  Narda Bonds, PharmD, BCPS Clinical Pharmacist Phone: [phone]

## 2019-10-01 NOTE — ED Provider Notes (Signed)
[facility] EMERGENCY DEPARTMENT Provider Note   CSN: [id number] Arrival date & time: 10/01/19  1623     History Chief Complaint  Patient presents with  . Leg Swelling    [name] is a 74 y.o. female with PMHx CHF with EF 60-65% (12/20/2018), CAD, HTN, HLD, kidney transplant recipient (2012), diabetes, who presents to the ED today complaining of bilateral lower extremity and LUE swelling x 2 weeks. Pt also reports a 20 pound weight gain in 2 weeks time. She states she saw her cardiologist 2 weeks ago and was given 3 days of torsemide. She states that since then she has been urinating very little and having small bowel movements. Pt also reports feeling more short of breath. She states she typically wears 3L O2 at home and has not had to increase it. Pt was supposed to follow up with cardiology in 1 weeks time however states that she has felt fatigued and generally weak and was unable to go to an appointment. She had a telephone visit with [facility] today and expressed her symptoms and was told to come to the ED for further evaluation.   Per chart review: Pt saw cardiology on 09/19/2019 with same complaints. Given 3 days of torsemide 20 mgs and told to come for recheck in 1 week.   The history is provided by the patient and medical records.       Past Medical History:  Diagnosis Date  . Acute CHF (congestive heart failure) ([place]) 12/2018  . Adenocarcinoma of lung, stage 1, right ([place]) 11/25/2017  . Anemia, iron deficiency    of chronic disease  . Aortic stenosis    a. Severe AS by echo 11/2012.  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 Asystole ([place])    a. During ENT surgery 2005: developed marked asystole requiring CPR, felt due to vagal reaction (cath nonobst dz).  . Carotid artery disease ([place])    a. Carotid Dopplers performed in August 2013 showed 40-59% left stenosis and 0-39% right; f/u recommended in 2 years.   .  Cerebrovascular accident [facility]) 2009   a. LMCA infarct felt embolic 9390, maintained on chronic coumadin.; denies residual on 04/05/2013  . Cholelithiasis   . Chronic Persistent Atrial Fibrillation 12/31/2008   Qualifier: Diagnosis of  By: [name]    . Coronary artery disease 05/2002   a. Ant MI 2003 s/p PTCA/stent to RCA.   . Diverticulosis of colon   . Esophagitis, reflux   . ESRD (end stage renal disease) ([place])    a. Mass on L kidney per pt s/p nephrectomy - pt states not cancer - [facility] notes indicate ESRD due to HTN/DM - was previously on HD. b. Kidney transplant 02/2011.  Marland Kitchen GERD (gastroesophageal reflux disease)   . Gout   . Hearing loss   . Helicobacter pylori (H. pylori) infection    hx of  . Hemorrhoids   . Hx of colonic polyps    adenomatous  . Hyperlipidemia   . Hypertension   . Lung nodule seen on imaging study 04/07/2013   1.0 cm ground glass opacity RUL  . Myocardial infarction ([place]) 2003  . Pericardial effusion    a. Small by echo 11/2011.  . S/P aortic valve replacement with bioprosthetic valve and maze procedure 04/12/2013   66mm [facility] Ease bovine pericardial tissue valve   . S/P Maze operation for atrial fibrillation 04/12/2013   Complete bilateral atrial  lesion set using bipolar radiofrequency and cryothermy ablation with clipping of LA appendage  . Sleep apnea    Pt says testing was positive, intolerant of CPAP.  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  . Stroke ([place])   . Type II diabetes mellitus [facility])     Patient Active Problem List   Diagnosis Date Noted  . Dyspnea 08/20/2019  . COPD (chronic obstructive pulmonary disease) ([place]) 08/20/2019  . Hematoma of arm, left, subsequent encounter 07/23/2019  . Obesity (BMI 30-39.9) 12/20/2018  . Acute respiratory failure with hypoxia ([place])  12/19/2018  . Gait disorder 11/22/2018  . History of seizure disorder 09/12/2018  . History of stroke 09/12/2018  . Edema 08/28/2018  . Hypertension secondary to other renal disorders 03/22/2018  . Proteinuria 03/22/2018  . Malignant neoplasm of lung ([place]) 03/22/2018  . Breast pain in female 02/04/2018  . Lung cancer ([place]) 11/30/2017  . Adenocarcinoma of lung, stage 1, right ([place]) 11/25/2017  . Chronic respiratory failure with hypoxia ([place]) 12/27/2016  . Acute on chronic diastolic CHF (congestive heart failure) ([place]) 12/27/2016  . Left upper extremity swelling 12/27/2016  . Pain of left breast 12/27/2016  . Arm muscle atrophy 11/16/2016  . Insomnia 02/25/2016  . Renal cyst 11/21/2015  . Renal mass, right 11/10/2015  . Shoulder pain, right 08/13/2015  . Weight gain 08/12/2014  . Multinodular goiter 05/01/2014  . Encounter for therapeutic drug monitoring 08/14/2013  . S/P aortic valve replacement with bioprosthetic valve and maze procedure 04/12/2013  . Nodule of right lung 04/07/2013  . Lung nodule seen on imaging study 04/07/2013  . Pain in limb 05/22/2012  . Dermatitis 01/26/2012  . Long term (current) use of anticoagulants 08/17/2011  . Anemia in chronic renal disease 05/13/2011  . Hypokalemia 04/26/2011  . Immunosuppression ([place]) 04/26/2011  . Hypertension associated with diabetes ([place]) 04/26/2011  . Renal transplant recipient 03/16/2011  . Low back pain 02/23/2011  . Hypersalivation 11/24/2010  . AORTIC STENOSIS 08/20/2010  . DISTURBANCE OF SALIVARY SECRETION 07/28/2010  . NAUSEA 04/14/2010  . SMOKER 10/07/2009  . ALOPECIA 10/07/2009  . CAROTID STENOSIS 03/04/2009  . AF (paroxysmal atrial fibrillation) ([place]) 12/31/2008  . NEOPLASM, MALIGNANT, KIDNEY 07/02/2008  . APHASIA DUE TO CEREBROVASCULAR DISEASE 07/02/2008  . Chronic fatigue 05/21/2008  . Hyperlipidemia LDL goal <70 09/27/2007  . Constipation 09/27/2007  . SLEEP APNEA 09/27/2007  . CHOLELITHIASIS 06/09/2007    . HELICOBACTER PYLORI INFECTION, HX OF 06/08/2007  . Type 2 diabetes mellitus with renal manifestations ([place]) 05/23/2007  . Gout 05/23/2007  . 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 MYOCARDIAL INFARCTION, HX OF 05/23/2007  . Coronary atherosclerosis 05/23/2007  . GERD 05/23/2007  . COLONIC POLYPS, HX OF 05/23/2007  . DIVERTICULOSIS, COLON 07/01/2005    Past Surgical History:  Procedure Laterality Date  . AORTIC VALVE REPLACEMENT N/A 04/12/2013   Procedure: AORTIC VALVE REPLACEMENT (AVR);  Surgeon: Rexene Alberts, MD;  Location: [facility];  Service: Open Heart Surgery;  Laterality: N/A;  . ARTERIOVENOUS GRAFT PLACEMENT Left   . ARTERIOVENOUS GRAFT PLACEMENT Left    "I've had 2 on my left; had one removed" (04/05/2013)   . ARTERY EXPLORATION Right 04/11/2013   Procedure: ARTERY EXPLORATION;  Surgeon: Rexene Alberts, MD;  Location: [facility];  Service: Open Heart Surgery;  Laterality: Right;  Right carotid artery exploration  . AV  FISTULA PLACEMENT Right   . AV FISTULA REPAIR Right    "took it out" ((/18/2014)  . CARDIOVERSION  05/29/2012   Procedure: CARDIOVERSION;  Surgeon: Lelon Perla, MD;  Location: [facility] ENDOSCOPY;  Service: Cardiovascular;  Laterality: N/A;  . CHOLECYSTECTOMY  2009   with hernia removal  . CORONARY ANGIOPLASTY WITH STENT PLACEMENT Right    coronary artery  . INSERTION OF DIALYSIS CATHETER Bilateral    "over the years; took them both out" (04/05/2013)  . INTRAOPERATIVE TRANSESOPHAGEAL ECHOCARDIOGRAM N/A 04/11/2013   Procedure: INTRAOPERATIVE TRANSESOPHAGEAL ECHOCARDIOGRAM;  Surgeon: Rexene Alberts, MD;  Location: [facility];  Service: Open Heart Surgery;  Laterality: N/A;  . INTRAOPERATIVE TRANSESOPHAGEAL ECHOCARDIOGRAM N/A 04/12/2013   Procedure: INTRAOPERATIVE TRANSESOPHAGEAL ECHOCARDIOGRAM;  Surgeon: Rexene Alberts, MD;  Location: [facility];  Service: Open Heart Surgery;  Laterality: N/A;  . KIDNEY TRANSPLANT  03/16/11  . LEFT AND RIGHT HEART CATHETERIZATION  WITH CORONARY ANGIOGRAM N/A 04/06/2013   Procedure: LEFT AND RIGHT HEART CATHETERIZATION WITH CORONARY ANGIOGRAM;  Surgeon: Blane Ohara, MD;  Location[facility] C[facility] LAB;  Service: Cardiovascular;  Laterality: N/A;  . MAZE N/A 04/12/2013   Procedure: MAZE;  Surgeon: Rexene Alberts, MD;  Location: [facility];  Service: Open Heart Surgery;  Laterality: N/A;  . NASAL RECONSTRUCTION WITH SEPTAL REPAIR     "took it out" (04/05/2013)  . NEPHRECTOMY Left 2010   no CA on bx  . TONSILLECTOMY    . TOTAL ABDOMINAL HYSTERECTOMY    . TUBAL LIGATION       OB History   No obstetric history on file.     Family History  Problem Relation Age of Onset  . Stroke Father   . Hypertension Mother   . Diabetes Other   . Diabetes Maternal Grandmother   . Diabetes Son   . Crohn's disease Other        grandson   . Breast cancer Neg Hx   . Stomach cancer Neg Hx   . Esophageal cancer Neg Hx   . Colon cancer Neg Hx   . Pancreatic cancer Neg Hx     Social History   Tobacco Use  . Smoking status: Former Smoker    Packs/day: 1.00    Years: 30.00    Pack years: 30.00    Types: Cigarettes    Quit date: 07/19/2001    Years since quitting: 18.2  . Smokeless tobacco: Never Used  Substance Use Topics  . Alcohol use: No  . Drug use: No    Home Medications Prior to Admission medications   Medication Sig Start Date End Date Taking? Authorizing Provider  acetaminophen (TYLENOL) 500 MG tablet Take 500 mg by mouth every 6 (six) hours as needed for mild pain or headache.   Yes [provider]  amoxicillin (AMOXIL) 500 MG capsule TAKE 4 CAPSULES BY MOUTH 1 HOUR PRIOR TO DENTAL WORK Patient taking differently: Take 2,000 mg by mouth See admin instructions. TAKE 4 CAPSULES BY MOUTH 1 HOUR PRIOR TO DENTAL WORK 05/24/19  Yes Plotnikov, [name], MD  dapsone 25 MG tablet Take 25 mg by mouth daily.    Yes [provider]  HYDROcodone-acetaminophen (NORCO/VICODIN) 5-325 MG tablet Take 1 tablet by mouth  every 6 (six) hours as needed for severe pain. 08/20/19 08/19/20 Yes Plotnikov, [name], MD  Insulin Glargine, 1 Unit Dial, (TOUJEO SOLOSTAR) 300 UNIT/ML SOPN Inject 24 Units into the skin every morning. Titrate up by 1 unit a day if needed  for goal sugars of 100-130 up to 30 units a day max 03/14/19  Yes Plotnikov, [name], MD  levothyroxine (SYNTHROID) 25 MCG tablet TAKE 2 TABLETS (50 MCG TOTAL) BY MOUTH DAILY. Patient taking differently: Take 50 mcg by mouth daily before breakfast.  12/18/18  Yes Plotnikov, [name], MD  metoprolol tartrate (LOPRESSOR) 25 MG tablet Take 0.5 tablets (12.5 mg total) by mouth 2 (two) times daily. 08/22/19  Yes Sherran Needs, NP  potassium chloride SA (KLOR-CON M20) 20 MEQ tablet Take 1 tablet (20 mEq total) by mouth daily. 09/19/19 12/18/19 Yes Cleaver, Jossie Ng, NP  predniSONE (DELTASONE) 5 MG tablet Take 5 mg by mouth daily.  03/21/18  Yes [provider]  Propylene Glycol (SYSTANE BALANCE) 0.6 % SOLN Place 1-2 drops into both eyes 3 (three) times daily as needed (for dryness).   Yes [provider]  rosuvastatin (CRESTOR) 20 MG tablet Take 1 tablet (20 mg total) by mouth daily. Patient taking differently: Take 20 mg by mouth every other day.  03/21/19  Yes Lelon Perla, MD  spironolactone (ALDACTONE) 25 MG tablet Take 1 tablet (25 mg total) by mouth daily. 12/27/18  Yes Pokhrel, Laxman, MD  tacrolimus (PROGRAF) 1 MG capsule Take 3 mg by mouth 2 (two) times daily.    Yes [provider]  warfarin (COUMADIN) 5 MG tablet Take 1/2 to 1 tablet by mouth daily as directed by coumadin clinic. Patient taking differently: Take 2.5-5 mg by mouth daily at 6 PM. 1/2 tablet on Mondays, 1 tablet all other days 05/22/19  Yes Crenshaw, Denice Bors, MD  Insulin Pen Needle (B-D UF III MINI PEN NEEDLES) 31G X 5 MM MISC USE TO ADMINISTER INSULIN FOUR TIMES A DAY DX E11.9 11/11/17   Plotnikov, [name], MD  [facility] DELICA LANCETS 13K MISC Use to check blood sugars three  times a day DX E11.9 10/12/17   Plotnikov, [name], MD  ONETOUCH VERIO test strip USE TO TEST 3 TIMES DAILY. DX E11.9 Patient taking differently: 1 each by Other route 3 (three) times daily. DX: E11.9 10/20/18   Hoyt Koch, MD  OXYGEN Inhale 3 L into the lungs as needed (for shortness of breath).     [provider]    Allergies    Ibuprofen, Sulfamethoxazole-trimethoprim, Sulfonamide derivatives, Tape, Tramadol, Doxycycline, Hydrocil [psyllium], Bactrim, and Red dye  Review of Systems   Review of Systems  Constitutional: Positive for fatigue. Negative for chills and fever.  Respiratory: Positive for shortness of breath. Negative for cough.   Cardiovascular: Positive for leg swelling (bilateral). Negative for chest pain.  Gastrointestinal: Positive for constipation and nausea. Negative for abdominal pain, diarrhea and vomiting.  Genitourinary:       + decreased urination  All other systems reviewed and are negative.   Physical Exam Updated Vital Signs BP (!) 136/102   Pulse 90   Temp 97.9 F (36.6 C) (Oral)   Resp (!) 31   SpO2 97%   Physical Exam Vitals and nursing note reviewed.  Constitutional:      Appearance: She is not ill-appearing or diaphoretic.  HENT:     Head: Normocephalic and atraumatic.  Eyes:     Conjunctiva/sclera: Conjunctivae normal.  Cardiovascular:     Rate and Rhythm: Normal rate and regular rhythm.     Pulses: Normal pulses.  Pulmonary:     Effort: Tachypnea present.     Breath sounds: Decreased breath sounds present. No wheezing, rhonchi or rales.  Abdominal:     Palpations: Abdomen is soft.     Tenderness: There is no abdominal tenderness. There is no right CVA tenderness, left CVA tenderness, guarding or rebound.  Musculoskeletal:     Cervical back: Neck supple.     Right lower leg: Edema present.     Left lower leg: Edema present.     Comments: 2+ pitting edema BLEs. LUE more swollen compared to RUE. 2+ Dp and radial  pulses bilaterally.   Skin:    General: Skin is warm and dry.  Neurological:     Mental Status: She is alert.     ED Results / Procedures / Treatments   Labs (all labs ordered are listed, but only abnormal results are displayed) Labs Reviewed  BASIC METABOLIC PANEL - Abnormal; Notable for the following components:      Result Value   Glucose, Bld 326 (*)    BUN 69 (*)    Creatinine, Ser 2.92 (*)    GFR calc non Af Amer 15 (*)    GFR calc Af Amer 17 (*)    All other components within normal limits  BRAIN NATRIURETIC PEPTIDE - Abnormal; Notable for the following components:   B Natriuretic Peptide 2,092.5 (*)    All other components within normal limits  CBC - Abnormal; Notable for the following components:   Hemoglobin 11.5 (*)    MCH 25.2 (*)    MCHC 28.9 (*)    RDW 20.7 (*)    All other components within normal limits  URINALYSIS, ROUTINE W REFLEX MICROSCOPIC - Abnormal; Notable for the following components:   APPearance HAZY (*)    Glucose, UA 50 (*)    Hgb urine dipstick MODERATE (*)    Protein, ur >=300 (*)    Leukocytes,Ua LARGE (*)    WBC, UA >50 (*)    All other components within normal limits  PROTIME-INR - Abnormal; Notable for the following components:   Prothrombin Time 22.1 (*)    INR 2.0 (*)    All other components within normal limits  SARS CORONAVIRUS 2 (TAT 6-24 HRS)  URINE CULTURE  MAGNESIUM  HEMOGLOBIN A1C  PROTIME-INR  BASIC METABOLIC PANEL    EKG None  Radiology DG Chest Portable 1 View  Result Date: 10/01/2019 CLINICAL DATA:  74 year old female with shortness of breath. EXAM: PORTABLE CHEST 1 VIEW COMPARISON:  Chest radiograph dated 12/22/2018. FINDINGS: There is stable cardiomegaly. Left atrial appendage occlusive device, median sternotomy wires, and mechanical aortic valve noted. No significant congestion. No focal consolidation, pleural effusion, pneumothorax. Linear and streaky density in the right upper lobe similar to prior CT and  radiograph of 12/22/2018 likely represents scarring or post radiation changes. There is atherosclerotic calcification of the aorta. No acute osseous pathology. IMPRESSION: 1. No acute cardiopulmonary process. 2. Stable cardiomegaly. Electronically Signed   By: Anner Crete M.D.   On: 10/01/2019 20:21    Procedures Procedures (including critical care time)  Medications Ordered in ED Medications  cefTRIAXone (ROCEPHIN) 1 g in sodium chloride 0.9 % 100 mL IVPB (1 g Intravenous New Bag/Given 10/01/19 2319)  cefTRIAXone (ROCEPHIN) 1 g in sodium chloride 0.9 % 100 mL IVPB (has no administration in time range)  insulin aspart (novoLOG) injection 0-15 Units (has no administration in time range)  insulin aspart (novoLOG) injection 0-5 Units (has no administration in time range)  levothyroxine (SYNTHROID) tablet 50 mcg (has no administration in time range)  metoprolol tartrate (LOPRESSOR) tablet 12.5 mg (has no administration  in time range)  predniSONE (DELTASONE) tablet 5 mg (has no administration in time range)  rosuvastatin (CRESTOR) tablet 20 mg (has no administration in time range)  tacrolimus (PROGRAF) capsule 3 mg (has no administration in time range)  dapsone tablet 25 mg (has no administration in time range)  HYDROcodone-acetaminophen (NORCO/VICODIN) 5-325 MG per tablet 1 tablet (has no administration in time range)  Propylene Glycol 0.6 % SOLN 1-2 drop (has no administration in time range)  insulin glargine (LANTUS) injection 30 Units (has no administration in time range)  spironolactone (ALDACTONE) tablet 25 mg (has no administration in time range)  warfarin (COUMADIN) tablet 2.5 mg (has no administration in time range)  Warfarin - Pharmacist Dosing Inpatient (has no administration in time range)  sodium chloride flush (NS) 0.9 % injection 3 mL (has no administration in time range)  sodium chloride flush (NS) 0.9 % injection 3 mL (has no administration in time range)  0.9 %  sodium  chloride infusion (has no administration in time range)  acetaminophen (TYLENOL) tablet 650 mg (has no administration in time range)  ondansetron (ZOFRAN) injection 4 mg (has no administration in time range)  furosemide (LASIX) injection 40 mg (has no administration in time range)  polyethylene glycol (MIRALAX / GLYCOLAX) packet 17 g (has no administration in time range)  furosemide (LASIX) injection 40 mg (40 mg Intravenous Given 10/01/19 2124)  sodium chloride 0.9 % bolus 500 mL (0 mLs Intravenous Stopped 10/01/19 2314)    ED Course  I have reviewed the triage vital signs and the nursing notes.  Pertinent labs & imaging results that were available during my care of the patient were reviewed by me and considered in my medical decision making (see chart for details).  74 year old female who presents to the ED today complaining of 20 pound weight gain, bilateral leg swelling, left upper extremity swelling for the past 2 weeks.  Recently seen by cardiology on 03/03 for same and given 3 days of torsemide 20 mg with follow-up in 1 week however patient did not make appointment.  On arrival to the ED patient is afebrile, nontachycardic.  She is tachypneic.  She does have some decreased breath sounds diffusely.  She is currently on 3 L O2 which she states she wears at home all the time.  Satting 97% on room air.  She has 2+ pitting edema in bilateral lower extremities and her left arm is significantly more swollen compared to the right.  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  Currently on Coumadin, states she may have missed 1 or 2 doses however not sure.  Unfortunately at this time our vascular ultrasound tech is gone for the night.  It does appear patient an ultrasound done June 2024 left arm swelling without evidence of DVT.  Will work-up shortness of breath at this time with screening labs, chest x-ray, BNP.  Patient does report she has been having difficulty urinating as well.  Will obtain bladder scan.  She  has no abdominal tenderness on exam today however states she has had some more constipation.  Still having bowel movements however states that it is pelleted.  Low suspicion for SBO at this time. Do not think pt needs imaging as she is still having movements and passing gas.   CBC without leukocytosis. Hgb stable.  BMP with glucose 326. Creatinine 2.92. Pt's baseline seems to be around ~ 1.80. Given renal transplant pt in 2012 she will need to be admitted for AKI. Small amount of fluid  ordered.   Lab Results  Component Value Date   CREATININE 2.92 (H) 10/01/2019   CREATININE 1.70 (H) 06/15/2019   CREATININE 1.96 (H) 01/02/2019   BNP elevated at 2,092.5. CXR without obvious vascular congestion. However given clinical signs of fluid overload will give IV lasix at this time. Will swab for COVID.   Bladder scan with approximately 200ish CCs fluids. Purewick placed.   U/A positive for UTI. Rocephin ordered.   Clinical Course as of Sep 30 2325  Mon Oct 01, 2019  2246 Discussed case with Dr. Joelyn Oms pt's nephrologist as well as Dr. Trinna Post at [facility]; both agree they do not think pt is rejecting her kidney and she can be managed at [facility].    [MV]  2319 Discussed case with Dr. Kalman Shan with cardiology who will follow however thinks this can be managed from either nephrology or medicine   [MV]  2326 Dr. Alcario Drought with [facility] to see patient for admission   [MV]    Clinical Course User Index [MV] Eustaquio Maize, PA-C   This note was prepared using Dragon voice recognition software and may include unintentional dictation errors due to the inherent limitations of voice recognition software.  MDM Rules/Calculators/A&P                      Final Clinical Impression(s) / ED Diagnoses Final diagnoses:  AKI (acute kidney injury) ([place])  Acute on chronic congestive heart failure, unspecified heart failure type ([place])  Acute cystitis with hematuria    Rx / DC Orders ED Discharge Orders    None        Eustaquio Maize, PA-C 10/01/19 2328    Isla Pence, MD 10/01/19 2337

## 2019-10-01 NOTE — ED Triage Notes (Signed)
Pt reports BLE and BUE swelling x1-2 weeks, taking "water pill" unable to "pee or poop"

## 2019-10-01 NOTE — Patient Outreach (Addendum)
[facility]) Care Management  10/01/2019  Dynasty Holquin 1945-12-27 [id number]   RN Health Coach Monthly Outreach  Referral Date: 05/31/2019 Referral Source: Transfer from [place] Reason for Referral: Continued Disease Management Education Insurance:Medicare   Outreach Attempt:  Successful telephone outreach to patient for monthly follow up.  HIPAA verified with patient. Patient reporting she is not dong well.  States her weight is up to 180 pounds, with bilateral lower extremity edema as well as swelling in her left arm and bilateral breast.  Reports she is unable to walk/stand on her legs and feet due to weakness, and numbness in her toes and can barely bath and dress herself at this time (big change from normal).  Continues to wear oxygen 3 liter/min as needed, reporting her shortness of breath is about the same as before.  Patient stating at her last coumadin clinic appointment, staff noticed her shortness of breath and she was able to see NP with Cardiologist.  Was told to take Torsemide 20 mg for 3 days and to follow up in one week.  Patient reporting she has not been able to have good bowel movement and that she has only been voiding about twice a day but only very small amounts.  States she was not able to make follow up appointment due to her physically not able to get there and husband not able to take her.  Does report she had lab work drawn at [employer] office this past Friday.  Patient acknowledges she has not informed her providers how she is feeling.  She requesting assistance with arranging home health to come and assist her.  RN Health Coach informed patient she needed to be seen by provider as soon as possible and asked if she was available to be seen sooner than this Thursday by primary care, patient stated yes.  RN Health Coach contact primary care provider, Dr. Judeen Hammans office and spoke with Inez Catalina.  Updated Betty on patient's condition and  symptoms, requesting sooner appointment.  Was told no appointment was available and they recommended patient go to the emergency room with increase in swelling and decrease in urine output.  RN Health Coach outreached to patient again, verified HIPAA and gave patient the recommendations of going to the emergency room per primary care.  Patient stated she will have her husband take her to emergency room.  Plan:  RN Health Coach will make another outreach attempt within the month of March if patient does not get admitted to hospital.   [place] [phone] [email or web address]

## 2019-10-01 NOTE — H&P (Addendum)
History and Physical    [name] JHE:[id number] DOB: Mar 17, 1946 DOA: 10/01/2019  PCP: [name], MD  Patient coming from: Home  I have personally briefly reviewed patient's old medical records in [place]  Chief Complaint: BLE Edema  HPI: [name] is a 74 y.o. female with medical history significant of HFpEF, CAD, HTN, HLD, kidney transplant in 2012, DM2.  Pt presents to the ED with c/o BLE and LUE swelling x2 weeks.  20lb wt gain in that time.  Cardiologist saw her 2 weeks ago, given 3 days of torsemide.  Since then urinating very little, and small hard BMs.  More SOB though hasnt increased her baseline home O2 of 3L.  Pt was supposed to follow up with cardiology in 1 weeks time however states that she has felt fatigued and generally weak and was unable to go to an appointment.   ED Course: BNP 2092, CXR neg.  Creat 2.9, initially was worrisome for AKI, but on EDP talking with Dr. Joelyn Oms it seems that this is her baseline more recently in the office (we cant see their office numbers in our system).  UA with large LE and > 50 WBC.  Pt given 40mg  IV lasix, rocephin for UTI, hospitalist asked to admit.   Review of Systems: As per HPI, otherwise all review of systems negative.  Past Medical History:  Diagnosis Date  . Acute CHF (congestive heart failure) ([place]) 12/2018  . Adenocarcinoma of lung, stage 1, right ([place]) 11/25/2017  . Anemia, iron deficiency    of chronic disease  . Aortic stenosis    a. Severe AS by echo 11/2012.  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 Asystole ([place])    a. During ENT surgery 2005: developed marked asystole requiring CPR, felt due to vagal reaction (cath nonobst dz).  . Carotid artery disease ([place])    a. Carotid Dopplers performed in August 2013 showed 40-59% left stenosis and 0-39% right; f/u recommended in 2 years.   . Cerebrovascular accident [facility]) 2009   a. LMCA infarct felt embolic 1856,  maintained on chronic coumadin.; denies residual on 04/05/2013  . Cholelithiasis   . Chronic Persistent Atrial Fibrillation 12/31/2008   Qualifier: Diagnosis of  By: [name]    . Coronary artery disease 05/2002   a. Ant MI 2003 s/p PTCA/stent to RCA.   . Diverticulosis of colon   . Esophagitis, reflux   . ESRD (end stage renal disease) ([place])    a. Mass on L kidney per pt s/p nephrectomy - pt states not cancer - [facility] notes indicate ESRD due to HTN/DM - was previously on HD. b. Kidney transplant 02/2011.  Marland Kitchen GERD (gastroesophageal reflux disease)   . Gout   . Hearing loss   . Helicobacter pylori (H. pylori) infection    hx of  . Hemorrhoids   . Hx of colonic polyps    adenomatous  . Hyperlipidemia   . Hypertension   . Lung nodule seen on imaging study 04/07/2013   1.0 cm ground glass opacity RUL  . Myocardial infarction ([place]) 2003  . Pericardial effusion    a. Small by echo 11/2011.  . S/P aortic valve replacement with bioprosthetic valve and maze procedure 04/12/2013   66mm [facility] Ease bovine pericardial tissue valve   . S/P Maze operation for atrial fibrillation 04/12/2013   Complete bilateral atrial lesion set using bipolar radiofrequency and cryothermy ablation with clipping of LA appendage  .  Sleep apnea    Pt says testing was positive, intolerant of CPAP.  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  . Stroke ([place])   . Type II diabetes mellitus ([place])     Past Surgical History:  Procedure Laterality Date  . AORTIC VALVE REPLACEMENT N/A 04/12/2013   Procedure: AORTIC VALVE REPLACEMENT (AVR);  Surgeon: Rexene Alberts, MD;  Location: [place];  Service: Open Heart Surgery;  Laterality: N/A;  . ARTERIOVENOUS GRAFT PLACEMENT Left   . ARTERIOVENOUS GRAFT PLACEMENT Left    "I've had 2 on my left; had one removed"  (04/05/2013)   . ARTERY EXPLORATION Right 04/11/2013   Procedure: ARTERY EXPLORATION;  Surgeon: Rexene Alberts, MD;  Location: [place];  Service: Open Heart Surgery;  Laterality: Right;  Right carotid artery exploration  . AV FISTULA PLACEMENT Right   . AV FISTULA REPAIR Right    "took it out" ((/18/2014)  . CARDIOVERSION  05/29/2012   Procedure: CARDIOVERSION;  Surgeon: Lelon Perla, MD;  Location: [facility] ENDOSCOPY;  Service: Cardiovascular;  Laterality: N/A;  . CHOLECYSTECTOMY  2009   with hernia removal  . CORONARY ANGIOPLASTY WITH STENT PLACEMENT Right    coronary artery  . INSERTION OF DIALYSIS CATHETER Bilateral    "over the years; took them both out" (04/05/2013)  . INTRAOPERATIVE TRANSESOPHAGEAL ECHOCARDIOGRAM N/A 04/11/2013   Procedure: INTRAOPERATIVE TRANSESOPHAGEAL ECHOCARDIOGRAM;  Surgeon: Rexene Alberts, MD;  Location: [place];  Service: Open Heart Surgery;  Laterality: N/A;  . INTRAOPERATIVE TRANSESOPHAGEAL ECHOCARDIOGRAM N/A 04/12/2013   Procedure: INTRAOPERATIVE TRANSESOPHAGEAL ECHOCARDIOGRAM;  Surgeon: Rexene Alberts, MD;  Location: [place];  Service: Open Heart Surgery;  Laterality: N/A;  . KIDNEY TRANSPLANT  03/16/11  . LEFT AND RIGHT HEART CATHETERIZATION WITH CORONARY ANGIOGRAM N/A 04/06/2013   Procedure: LEFT AND RIGHT HEART CATHETERIZATION WITH CORONARY ANGIOGRAM;  Surgeon: Blane Ohara, MD;  Location: [facility] CATH LAB;  Service: Cardiovascular;  Laterality: N/A;  . MAZE N/A 04/12/2013   Procedure: MAZE;  Surgeon: Rexene Alberts, MD;  Location: [place];  Service: Open Heart Surgery;  Laterality: N/A;  . NASAL RECONSTRUCTION WITH SEPTAL REPAIR     "took it out" (04/05/2013)  . NEPHRECTOMY Left 2010   no CA on bx  . TONSILLECTOMY    . TOTAL ABDOMINAL HYSTERECTOMY    . TUBAL LIGATION       reports that she quit smoking about 18 years ago. Her smoking use included cigarettes. She has a 30.00 pack-year smoking history. She has never used smokeless tobacco. She reports that she  does not drink alcohol or use drugs.  Allergies  Allergen Reactions  . Ibuprofen Nausea And Vomiting  . Sulfamethoxazole-Trimethoprim Itching, Swelling and Rash    Swelling of the face  . Sulfonamide Derivatives Itching, Swelling and Rash    Swelling of the face  . Tape Rash    Paper tape is ok  . Tramadol Nausea And Vomiting  . Doxycycline Nausea Only  . Hydrocil [Psyllium] Nausea And Vomiting  . Bactrim Itching, Swelling and Rash  . Red Dye Itching and Rash    Family History  Problem Relation Age of Onset  . Stroke Father   . Hypertension Mother   . Diabetes Other   . Diabetes Maternal Grandmother   . Diabetes Son   . Crohn's disease Other  grandson   . Breast cancer Neg Hx   . Stomach cancer Neg Hx   . Esophageal cancer Neg Hx   . Colon cancer Neg Hx   . Pancreatic cancer Neg Hx      Prior to Admission medications   Medication Sig Start Date End Date Taking? Authorizing Provider  acetaminophen (TYLENOL) 500 MG tablet Take 500 mg by mouth every 6 (six) hours as needed for mild pain or headache.   Yes [provider]  amoxicillin (AMOXIL) 500 MG capsule TAKE 4 CAPSULES BY MOUTH 1 HOUR PRIOR TO DENTAL WORK Patient taking differently: Take 2,000 mg by mouth See admin instructions. TAKE 4 CAPSULES BY MOUTH 1 HOUR PRIOR TO DENTAL WORK 05/24/19  Yes Plotnikov, [name], MD  dapsone 25 MG tablet Take 25 mg by mouth daily.    Yes [provider]  HYDROcodone-acetaminophen (NORCO/VICODIN) 5-325 MG tablet Take 1 tablet by mouth every 6 (six) hours as needed for severe pain. 08/20/19 08/19/20 Yes Plotnikov, [name], MD  Insulin Glargine, 1 Unit Dial, (TOUJEO SOLOSTAR) 300 UNIT/ML SOPN Inject 24 Units into the skin every morning. Titrate up by 1 unit a day if needed for goal sugars of 100-130 up to 30 units a day max 03/14/19  Yes Plotnikov, [name], MD  levothyroxine (SYNTHROID) 25 MCG tablet TAKE 2 TABLETS (50 MCG TOTAL) BY MOUTH DAILY. Patient taking  differently: Take 50 mcg by mouth daily before breakfast.  12/18/18  Yes Plotnikov, [name], MD  metoprolol tartrate (LOPRESSOR) 25 MG tablet Take 0.5 tablets (12.5 mg total) by mouth 2 (two) times daily. 08/22/19  Yes Sherran Needs, NP  potassium chloride SA (KLOR-CON M20) 20 MEQ tablet Take 1 tablet (20 mEq total) by mouth daily. 09/19/19 12/18/19 Yes Cleaver, Jossie Ng, NP  predniSONE (DELTASONE) 5 MG tablet Take 5 mg by mouth daily.  03/21/18  Yes [provider]  Propylene Glycol (SYSTANE BALANCE) 0.6 % SOLN Place 1-2 drops into both eyes 3 (three) times daily as needed (for dryness).   Yes [provider]  rosuvastatin (CRESTOR) 20 MG tablet Take 1 tablet (20 mg total) by mouth daily. Patient taking differently: Take 20 mg by mouth every other day.  03/21/19  Yes Lelon Perla, MD  spironolactone (ALDACTONE) 25 MG tablet Take 1 tablet (25 mg total) by mouth daily. 12/27/18  Yes Pokhrel, Laxman, MD  tacrolimus (PROGRAF) 1 MG capsule Take 3 mg by mouth 2 (two) times daily.    Yes [provider]  warfarin (COUMADIN) 5 MG tablet Take 1/2 to 1 tablet by mouth daily as directed by coumadin clinic. Patient taking differently: Take 2.5-5 mg by mouth daily at 6 PM. 1/2 tablet on Mondays, 1 tablet all other days 05/22/19  Yes Crenshaw, Denice Bors, MD  Insulin Pen Needle (B-D UF III MINI PEN NEEDLES) 31G X 5 MM MISC USE TO ADMINISTER INSULIN FOUR TIMES A DAY DX E11.9 11/11/17   Plotnikov, [name], MD  [facility] DELICA LANCETS 16X MISC Use to check blood sugars three times a day DX E11.9 10/12/17   Plotnikov, [name], MD  ONETOUCH VERIO test strip USE TO TEST 3 TIMES DAILY. DX E11.9 Patient taking differently: 1 each by Other route 3 (three) times daily. DX: E11.9 10/20/18   Hoyt Koch, MD  OXYGEN Inhale 3 L into the lungs as needed (for shortness of breath).     [provider]    Physical Exam: Vitals:   10/01/19 2200  10/01/19 2230 10/01/19 2245 10/01/19 2300    BP: (!) 161/94 (!) 142/98 91/80 104/75  Pulse: (!) 47     Resp: (!) 23 18 17  (!) 21  Temp:      TempSrc:      SpO2: 93%     Weight:      Height:        Constitutional: NAD, calm, comfortable Eyes: PERRL, lids and conjunctivae normal ENMT: Mucous membranes are moist. Posterior pharynx clear of any exudate or lesions.Normal dentition.  Neck: normal, supple, no masses, no thyromegaly Respiratory: clear to auscultation bilaterally, no wheezing, no crackles. Normal respiratory effort. No accessory muscle use.  Cardiovascular: Regular rate and rhythm, no murmurs / rubs / gallops. No extremity edema. 2+ pedal pulses. No carotid bruits.  Abdomen: no tenderness, no masses palpated. No hepatosplenomegaly. Bowel sounds positive.  Musculoskeletal: no clubbing / cyanosis. No joint deformity upper and lower extremities. Good ROM, no contractures. Normal muscle tone.  Skin: no rashes, lesions, ulcers. No induration Neurologic: CN 2-12 grossly intact. Sensation intact, DTR normal. Strength 5/5 in all 4.  Psychiatric: Normal judgment and insight. Alert and oriented x 3. Normal mood.    Labs on Admission: I have personally reviewed following labs and imaging studies  CBC: Recent Labs  Lab 10/01/19 1724  WBC 6.6  HGB 11.5*  HCT 39.8  MCV 87.1  PLT 401   Basic Metabolic Panel: Recent Labs  Lab 10/01/19 1724  NA 138  K 3.7  CL 101  CO2 23  GLUCOSE 326*  BUN 69*  CREATININE 2.92*  CALCIUM 9.4  MG 2.2   GFR: Estimated Creatinine Clearance: 15.7 mL/min (A) (by C-G formula based on SCr of 2.92 mg/dL (H)). Liver Function Tests: No results for input(s): AST, ALT, ALKPHOS, BILITOT, PROT, ALBUMIN in the last 168 hours. No results for input(s): LIPASE, AMYLASE in the last 168 hours. No results for input(s): AMMONIA in the last 168 hours. Coagulation Profile: Recent Labs  Lab 10/01/19 2125  INR 2.0*   Cardiac Enzymes: No results for input(s): CKTOTAL, CKMB, CKMBINDEX, TROPONINI in  the last 168 hours. BNP (last 3 results) No results for input(s): PROBNP in the last 8760 hours. HbA1C: No results for input(s): HGBA1C in the last 72 hours. CBG: No results for input(s): GLUCAP in the last 168 hours. Lipid Profile: No results for input(s): CHOL, HDL, LDLCALC, TRIG, CHOLHDL, LDLDIRECT in the last 72 hours. Thyroid Function Tests: No results for input(s): TSH, T4TOTAL, FREET4, T3FREE, THYROIDAB in the last 72 hours. Anemia Panel: No results for input(s): VITAMINB12, FOLATE, FERRITIN, TIBC, IRON, RETICCTPCT in the last 72 hours. Urine analysis:    Component Value Date/Time   COLORURINE YELLOW 10/01/2019 2232   APPEARANCEUR HAZY (A) 10/01/2019 2232   LABSPEC 1.010 10/01/2019 2232   PHURINE 6.0 10/01/2019 2232   GLUCOSEU 50 (A) 10/01/2019 2232   GLUCOSEU 100 (A) 07/02/2016 1440   HGBUR MODERATE (A) 10/01/2019 2232   BILIRUBINUR NEGATIVE 10/01/2019 2232   KETONESUR NEGATIVE 10/01/2019 2232   PROTEINUR >=300 (A) 10/01/2019 2232   UROBILINOGEN 0.2 07/02/2016 1440   NITRITE NEGATIVE 10/01/2019 2232   LEUKOCYTESUR LARGE (A) 10/01/2019 2232    Radiological Exams on Admission: DG Chest Portable 1 View  Result Date: 10/01/2019 CLINICAL DATA:  74 year old female with shortness of breath. EXAM: PORTABLE CHEST 1 VIEW COMPARISON:  Chest radiograph dated 12/22/2018. FINDINGS: There is stable cardiomegaly. Left atrial appendage occlusive device, median sternotomy wires, and mechanical aortic valve noted. No significant congestion. No  focal consolidation, pleural effusion, pneumothorax. Linear and streaky density in the right upper lobe similar to prior CT and radiograph of 12/22/2018 likely represents scarring or post radiation changes. There is atherosclerotic calcification of the aorta. No acute osseous pathology. IMPRESSION: 1. No acute cardiopulmonary process. 2. Stable cardiomegaly. Electronically Signed   By: Anner Crete M.D.   On: 10/01/2019 20:21    EKG: Independently  reviewed.  Assessment/Plan Principal Problem:   Acute on chronic diastolic CHF (congestive heart failure) (HCC) Active Problems:   Type 2 diabetes mellitus with renal manifestations (HCC)   AF (paroxysmal atrial fibrillation) ([place])   Renal transplant recipient   S/P aortic valve replacement with bioprosthetic valve and maze procedure   Chronic respiratory failure with hypoxia (HCC)   Adenocarcinoma of lung, stage 1, right ([place])   Hypertension associated with diabetes ([place])    1. Acute on chronic diastolic CHF - 1. CHF pathway 2. Lasix 40mg  IV BID to start 3. Strict intake and output 4. Cont home aldactone 5. Tele monitor 2. S/p renal transplant - 1. Daily BMP with diuresis 2. Cont prednisone 3. Cont tacrolimus 4. Cont Dapsone 5. Creat of 2.9 today is about baseline for patient per EDP discussion with Dr. Joelyn Oms 3. UTI - 1. Rocephin 2. UCx pending 4. PAF - 1. Cont coumadin 2. Cont metoprolol 5. DM2 - 1. Took 40u glargine today 2. Will put on lantus 30u daily 3. With mod scale SSI AC/HS 6. HTN - 1. Cont metoprolol, alactone 7. Chronic resp failure with hypoxia - 1. On 3L at baseline 8. Stage 1 adenocarcinoma of lung - 1. S/p radiation 2. Being followed by PCP 3. Last CT scan was just last month 4. Thankfully not very impressive  DVT prophylaxis: Coumadin Code Status: Full Family Communication: No family in room Disposition Plan: Home after admit Consults called: EDP spoke with Dr. Joelyn Oms Admission status: Place in obs     Julea Hutto, [place] Hospitalists  How to contact the [facility] Attending or Consulting provider [place] or covering provider during after hours [place], for this patient?  1. Check the care team in [facility] and look for a) attending/consulting TR[facility] provider listed and b) the [facility] team listed 2. Log into [email or web address]  Amion Physician Scheduling and messaging for groups and whole hospitals  On call and physician scheduling software for group  practices, residents, hospitalists and other medical providers for call, clinic, rotation and shift schedules. OnCall Enterprise is a hospital-wide system for scheduling doctors and paging doctors on call. EasyPlot is for scientific plotting and data analysis.  [email or web address]  and use [place]'s universal password to access. If you do not have the password, please contact the hospital operator.  3. Locate the [facility] provider you are looking for under [facility] and page to a number that you can be directly reached. 4. If you still have difficulty reaching the provider, please page the [facility] (Director on Call) for the Hospitalists listed on amion for assistance.  10/01/2019, 11:32 PM

## 2019-10-02 ENCOUNTER — Ambulatory Visit (HOSPITAL_BASED_OUTPATIENT_CLINIC_OR_DEPARTMENT_OTHER): Payer: Medicare Other

## 2019-10-02 ENCOUNTER — Other Ambulatory Visit: Payer: Self-pay | Admitting: *Deleted

## 2019-10-02 DIAGNOSIS — I480 Paroxysmal atrial fibrillation: Secondary | ICD-10-CM | POA: Diagnosis not present

## 2019-10-02 DIAGNOSIS — J9611 Chronic respiratory failure with hypoxia: Secondary | ICD-10-CM | POA: Diagnosis present

## 2019-10-02 DIAGNOSIS — E039 Hypothyroidism, unspecified: Secondary | ICD-10-CM | POA: Diagnosis present

## 2019-10-02 DIAGNOSIS — Z955 Presence of coronary angioplasty implant and graft: Secondary | ICD-10-CM | POA: Diagnosis not present

## 2019-10-02 DIAGNOSIS — E876 Hypokalemia: Secondary | ICD-10-CM | POA: Diagnosis not present

## 2019-10-02 DIAGNOSIS — M7989 Other specified soft tissue disorders: Secondary | ICD-10-CM | POA: Diagnosis present

## 2019-10-02 DIAGNOSIS — N1832 Chronic kidney disease, stage 3b: Secondary | ICD-10-CM | POA: Diagnosis not present

## 2019-10-02 DIAGNOSIS — I2510 Atherosclerotic heart disease of native coronary artery without angina pectoris: Secondary | ICD-10-CM | POA: Diagnosis present

## 2019-10-02 DIAGNOSIS — Z20822 Contact with and (suspected) exposure to covid-19: Secondary | ICD-10-CM | POA: Diagnosis present

## 2019-10-02 DIAGNOSIS — Y830 Surgical operation with transplant of whole organ as the cause of abnormal reaction of the patient, or of later complication, without mention of misadventure at the time of the procedure: Secondary | ICD-10-CM | POA: Diagnosis present

## 2019-10-02 DIAGNOSIS — D631 Anemia in chronic kidney disease: Secondary | ICD-10-CM | POA: Diagnosis present

## 2019-10-02 DIAGNOSIS — Z940 Kidney transplant status: Secondary | ICD-10-CM | POA: Diagnosis not present

## 2019-10-02 DIAGNOSIS — E1169 Type 2 diabetes mellitus with other specified complication: Secondary | ICD-10-CM | POA: Diagnosis present

## 2019-10-02 DIAGNOSIS — N179 Acute kidney failure, unspecified: Secondary | ICD-10-CM | POA: Diagnosis present

## 2019-10-02 DIAGNOSIS — Z794 Long term (current) use of insulin: Secondary | ICD-10-CM | POA: Diagnosis not present

## 2019-10-02 DIAGNOSIS — I5033 Acute on chronic diastolic (congestive) heart failure: Secondary | ICD-10-CM | POA: Diagnosis present

## 2019-10-02 DIAGNOSIS — E785 Hyperlipidemia, unspecified: Secondary | ICD-10-CM | POA: Diagnosis present

## 2019-10-02 DIAGNOSIS — D849 Immunodeficiency, unspecified: Secondary | ICD-10-CM | POA: Diagnosis not present

## 2019-10-02 DIAGNOSIS — I10 Essential (primary) hypertension: Secondary | ICD-10-CM | POA: Diagnosis not present

## 2019-10-02 DIAGNOSIS — G4730 Sleep apnea, unspecified: Secondary | ICD-10-CM | POA: Diagnosis present

## 2019-10-02 DIAGNOSIS — I5031 Acute diastolic (congestive) heart failure: Secondary | ICD-10-CM | POA: Diagnosis not present

## 2019-10-02 DIAGNOSIS — N3001 Acute cystitis with hematuria: Secondary | ICD-10-CM | POA: Diagnosis present

## 2019-10-02 DIAGNOSIS — N059 Unspecified nephritic syndrome with unspecified morphologic changes: Secondary | ICD-10-CM | POA: Diagnosis present

## 2019-10-02 DIAGNOSIS — Z953 Presence of xenogenic heart valve: Secondary | ICD-10-CM | POA: Diagnosis not present

## 2019-10-02 DIAGNOSIS — E1121 Type 2 diabetes mellitus with diabetic nephropathy: Secondary | ICD-10-CM | POA: Diagnosis not present

## 2019-10-02 DIAGNOSIS — E1129 Type 2 diabetes mellitus with other diabetic kidney complication: Secondary | ICD-10-CM | POA: Diagnosis not present

## 2019-10-02 DIAGNOSIS — I130 Hypertensive heart and chronic kidney disease with heart failure and stage 1 through stage 4 chronic kidney disease, or unspecified chronic kidney disease: Secondary | ICD-10-CM | POA: Diagnosis present

## 2019-10-02 DIAGNOSIS — E1122 Type 2 diabetes mellitus with diabetic chronic kidney disease: Secondary | ICD-10-CM | POA: Diagnosis present

## 2019-10-02 DIAGNOSIS — I152 Hypertension secondary to endocrine disorders: Secondary | ICD-10-CM | POA: Diagnosis present

## 2019-10-02 DIAGNOSIS — D84821 Immunodeficiency due to drugs: Secondary | ICD-10-CM | POA: Diagnosis present

## 2019-10-02 DIAGNOSIS — I272 Pulmonary hypertension, unspecified: Secondary | ICD-10-CM | POA: Diagnosis present

## 2019-10-02 DIAGNOSIS — I509 Heart failure, unspecified: Secondary | ICD-10-CM

## 2019-10-02 DIAGNOSIS — N184 Chronic kidney disease, stage 4 (severe): Secondary | ICD-10-CM | POA: Diagnosis present

## 2019-10-02 DIAGNOSIS — E1159 Type 2 diabetes mellitus with other circulatory complications: Secondary | ICD-10-CM | POA: Diagnosis not present

## 2019-10-02 DIAGNOSIS — T8619 Other complication of kidney transplant: Secondary | ICD-10-CM | POA: Diagnosis present

## 2019-10-02 DIAGNOSIS — I4819 Other persistent atrial fibrillation: Secondary | ICD-10-CM | POA: Diagnosis present

## 2019-10-02 LAB — BASIC METABOLIC PANEL
Anion gap: 12 (ref 5–15)
BUN: 67 mg/dL — ABNORMAL HIGH (ref 8–23)
CO2: 24 mmol/L (ref 22–32)
Calcium: 9.3 mg/dL (ref 8.9–10.3)
Chloride: 103 mmol/L (ref 98–111)
Creatinine, Ser: 2.74 mg/dL — ABNORMAL HIGH (ref 0.44–1.00)
GFR calc Af Amer: 19 mL/min — ABNORMAL LOW (ref >60)
GFR calc non Af Amer: 16 mL/min — ABNORMAL LOW (ref >60)
Glucose, Bld: 164 mg/dL — ABNORMAL HIGH (ref 70–99)
Potassium: 4.2 mmol/L (ref 3.5–5.1)
Sodium: 139 mmol/L (ref 135–145)

## 2019-10-02 LAB — GLUCOSE, CAPILLARY
Glucose-Capillary: 97 mg/dL (ref 70–99)
Glucose-Capillary: 145 mg/dL — ABNORMAL HIGH (ref 70–99)

## 2019-10-02 LAB — ECHOCARDIOGRAM COMPLETE
Height: 60 in
Weight: 2880 oz

## 2019-10-02 LAB — CBG MONITORING, ED
Glucose-Capillary: 81 mg/dL (ref 70–99)
Glucose-Capillary: 118 mg/dL — ABNORMAL HIGH (ref 70–99)

## 2019-10-02 LAB — HEMOGLOBIN A1C
Hgb A1c MFr Bld: 9 % — ABNORMAL HIGH (ref 4.8–5.6)
Mean Plasma Glucose: 211.6 mg/dL

## 2019-10-02 LAB — SARS CORONAVIRUS 2 (TAT 6-24 HRS): SARS Coronavirus 2: NEGATIVE

## 2019-10-02 LAB — PROTIME-INR
INR: 2 — ABNORMAL HIGH (ref 0.8–1.2)
Prothrombin Time: 22.4 seconds — ABNORMAL HIGH (ref 11.4–15.2)

## 2019-10-02 MED ORDER — TACROLIMUS 1 MG PO CAPS
3.0000 mg | ORAL_CAPSULE | Freq: Two times a day (BID) | ORAL | Status: DC
  Administered 2019-10-02 – 2019-10-11 (×18): 3 mg via ORAL
  Filled 2019-10-02 (×20): qty 3

## 2019-10-02 NOTE — Plan of Care (Signed)
  Problem: Education: Goal: Ability to verbalize understanding of medication therapies will improve Outcome: Progressing   

## 2019-10-02 NOTE — Patient Outreach (Signed)
[name]) Care Management  10/02/2019  [name] April 18, 1946 [id number]    RN Health Coach Hospitalization  Referral Date: 05/31/2019 Referral Source: Transfer from [place] Reason for Referral: Continued Disease Management Education Insurance:Medicare   Outreach Attempt:  Patient went to emergency room yesterday as requested and is awaiting admission for heart failure exacerbation.  RN Health Coach notified [facility] Liaison of patient's pending admission.  Plan:  Will await [facility] Liaison's recommendations for discharge follow up.  [place] [phone] Chloee [email or web address]

## 2019-10-02 NOTE — ED Notes (Signed)
Tele   Breakfast ordered  

## 2019-10-02 NOTE — Progress Notes (Signed)
  Echocardiogram 2D Echocardiogram has been performed.  Michiel Cowboy 10/02/2019, 9:34 AM

## 2019-10-02 NOTE — Consult Note (Addendum)
[facility] Renal Consultation Note  Requesting MD: Nita Sells, MD  Indication for Consultation: CKD in a transplant patient  Chief complaint: Lower extremity swelling  HPI:  [name] is a 74 y.o. female with a history of CKD, prior ESRD status post renal transplant, hypertension, coronary artery disease and diabetes who presented to the hospital with lower extremity swelling.  She follows as an outpatient with Dr. Joelyn Oms in our office at [facility].  She states that she is weak and crawling up and down the steps at home.  She was instructed to start torsemide 20 mg daily at her last renal appt on 2/26 and her ACE inhibitor was stopped at that time due to worsening renal function.  She states she never picked up the torsemide.  She was also recently seen by cardiology and given torsemide x 3 days (she did pick this up) a couple of weeks ago and missed her follow-up with them due to weakness, fatigue.  She states has also been trying to drink a lot of water.  She is noted to recently have nephrotic range proteinuria.  Per her nephrologist she has has been evaluated by [facility] and transplant biopsy has been deferred; prior charting indicates deferred in setting of lung cancer.  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  Nephrology is consulted for assistance with management of CKD.  Creatinine trends as below.  Note that SARS coronavirus 2 testing was negative on 3/15.  Creatinine is 2.74 today compared with 2.92 on 3/15.  Her previous baseline creatinine was near 2.  She had 1.3 L of urine over 3/15 charted.  Note that she was started on Lasix 40 mg IV twice daily here.  Has just been on spironolactone 12.5 mg daily at home - 25 mg daily ordered here.  She states "my kidneys aren't that bad" and states didn't know she had kidney failure.  Has been on 3 liters oxygen at home for about a year.  [facility] RN is here and states that she may be  discharged today to a program at her home where she would be monitored daily but could get IV diuretics - but they are awaiting PT eval.  Pt states crawls up the stairs.  They are unsure if she will be approved.  [facility] labs:  05/2019 - Cr 1.81, BUN 33 at [facility] 09/14/19 - Cr 2.80, BUN 59, eGFR 18 09/28/19 - Cr 2.95, BUN 75, eGFR 17, tacrolimus 3.0   Creatinine  Date/Time Value Ref Range Status  06/15/2019 08:59 AM 1.70 (H) 0.44 - 1.00 mg/dL Final  12/08/2018 09:58 AM 1.92 (H) 0.44 - 1.00 mg/dL Final  06/06/2018 10:38 AM 2.25 (H) 0.44 - 1.00 mg/dL Final  12/08/2017 01:59 PM 1.93 (H) 0.60 - 1.10 mg/dL Final   Creat  Date/Time Value Ref Range Status  03/30/2013 04:34 PM 1.35 (H) 0.50 - 1.10 mg/dL Final   Creatinine, Ser  Date/Time Value Ref Range Status  10/02/2019 03:01 AM 2.74 (H) 0.44 - 1.00 mg/dL Final  10/01/2019 05:24 PM 2.92 (H) 0.44 - 1.00 mg/dL Final  01/02/2019 02:50 PM 1.96 (H) 0.57 - 1.00 mg/dL Final  12/26/2018 03:31 AM 2.17 (H) 0.44 - 1.00 mg/dL Final  12/25/2018 11:17 AM 2.28 (H) 0.44 - 1.00 mg/dL Final  12/24/2018 05:25 AM 2.14 (H) 0.44 - 1.00 mg/dL Final  12/23/2018 04:44 AM 2.26 (H) 0.44 - 1.00 mg/dL Final  12/22/2018 04:33 AM 2.34 (H) 0.44 - 1.00  mg/dL Final  12/21/2018 04:15 AM 2.38 (H) 0.44 - 1.00 mg/dL Final  12/20/2018 04:42 AM 1.95 (H) 0.44 - 1.00 mg/dL Final  12/19/2018 04:21 PM 1.89 (H) 0.44 - 1.00 mg/dL Final  02/08/2018 03:23 AM 1.69 (H) 0.44 - 1.00 mg/dL Final  02/07/2018 06:28 AM 1.71 (H) 0.44 - 1.00 mg/dL Final  02/06/2018 04:28 AM 1.78 (H) 0.44 - 1.00 mg/dL Final  02/05/2018 06:45 AM 1.69 (H) 0.44 - 1.00 mg/dL Final  02/04/2018 12:31 AM 1.20 (H) 0.44 - 1.00 mg/dL Final  02/04/2018 12:03 AM 1.44 (H) 0.44 - 1.00 mg/dL Final  09/02/2017 03:52 PM 1.65 (H) 0.40 - 1.20 mg/dL Final  01/07/2017 02:55 PM 1.54 (H) 0.40 - 1.20 mg/dL Final  01/01/2017 03:44 AM 1.72 (H) 0.44 - 1.00 mg/dL Final  12/31/2016 03:46 AM 1.65 (H) 0.44 - 1.00 mg/dL Final  12/30/2016 01:48  AM 1.69 (H) 0.44 - 1.00 mg/dL Final  12/29/2016 02:51 AM 1.63 (H) 0.44 - 1.00 mg/dL Final  12/28/2016 03:55 AM 1.37 (H) 0.44 - 1.00 mg/dL Final  12/27/2016 03:51 PM 1.22 (H) 0.44 - 1.00 mg/dL Final  11/18/2016 01:54 PM 1.39 (H) 0.40 - 1.20 mg/dL Final  11/16/2016 04:59 PM 1.49 (H) 0.40 - 1.20 mg/dL Final  07/02/2016 02:40 PM 1.35 (H) 0.40 - 1.20 mg/dL Final  04/12/2014 04:15 PM 1.6 (H) 0.4 - 1.2 mg/dL Final  01/09/2014 03:22 PM 2.0 (H) 0.4 - 1.2 mg/dL Final  07/10/2013 03:56 PM 1.8 (H) 0.4 - 1.2 mg/dL Final  04/22/2013 05:00 AM 2.51 (H) 0.50 - 1.10 mg/dL Final  04/20/2013 05:50 AM 2.55 (H) 0.50 - 1.10 mg/dL Final  04/19/2013 07:00 AM 2.44 (H) 0.50 - 1.10 mg/dL Final  04/18/2013 04:20 AM 2.12 (H) 0.50 - 1.10 mg/dL Final  04/17/2013 02:00 PM 2.14 (H) 0.50 - 1.10 mg/dL Final  04/17/2013 04:00 AM 2.05 (H) 0.50 - 1.10 mg/dL Final  04/16/2013 03:44 AM 2.34 (H) 0.50 - 1.10 mg/dL Final  04/15/2013 04:38 PM 2.42 (H) 0.50 - 1.10 mg/dL Final  04/15/2013 03:56 AM 2.17 (H) 0.50 - 1.10 mg/dL Final  04/14/2013 04:00 AM 1.82 (H) 0.50 - 1.10 mg/dL Final  04/13/2013 04:43 PM 1.60 (H) 0.50 - 1.10 mg/dL Final  04/13/2013 04:00 PM 1.61 (H) 0.50 - 1.10 mg/dL Final  04/13/2013 03:30 AM 1.33 (H) 0.50 - 1.10 mg/dL Final  04/12/2013 06:57 PM 1.30 (H) 0.50 - 1.10 mg/dL Final  04/12/2013 06:00 PM 1.13 (H) 0.50 - 1.10 mg/dL Final  04/12/2013 04:00 AM 1.26 (H) 0.50 - 1.10 mg/dL Final  04/11/2013 05:30 AM 1.52 (H) 0.50 - 1.10 mg/dL Final  04/10/2013 05:30 AM 1.34 (H) 0.50 - 1.10 mg/dL Final  04/08/2013 05:45 AM 1.06 0.50 - 1.10 mg/dL Final  04/07/2013 05:30 AM 1.35 (H) 0.50 - 1.10 mg/dL Final  04/06/2013 05:20 AM 1.24 (H) 0.50 - 1.10 mg/dL Final     PMHx:   Past Medical History:  Diagnosis Date  . Acute CHF (congestive heart failure) ([place]) 12/2018  . Adenocarcinoma of lung, stage 1, right ([place]) 11/25/2017  . Anemia, iron deficiency    of chronic disease  . Aortic stenosis    a. Severe AS by echo 11/2012.   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 Asystole ([place])    a. During ENT surgery 2005: developed marked asystole requiring CPR, felt due to vagal reaction (cath nonobst dz).  . Carotid artery disease ([place])    a. Carotid Dopplers performed in August 2013 showed 40-59% left stenosis and  0-39% right; f/u recommended in 2 years.   . Cerebrovascular accident [facility]) 2009   a. LMCA infarct felt embolic 6770, maintained on chronic coumadin.; denies residual on 04/05/2013  . Cholelithiasis   . Chronic Persistent Atrial Fibrillation 12/31/2008   Qualifier: Diagnosis of  By: [name]    . Coronary artery disease 05/2002   a. Ant MI 2003 s/p PTCA/stent to RCA.   . Diverticulosis of colon   . Esophagitis, reflux   . ESRD (end stage renal disease) ([place])    a. Mass on L kidney per pt s/p nephrectomy - pt states not cancer - [facility] notes indicate ESRD due to HTN/DM - was previously on HD. b. Kidney transplant 02/2011.  Marland Kitchen GERD (gastroesophageal reflux disease)   . Gout   . Hearing loss   . Helicobacter pylori (H. pylori) infection    hx of  . Hemorrhoids   . Hx of colonic polyps    adenomatous  . Hyperlipidemia   . Hypertension   . Lung nodule seen on imaging study 04/07/2013   1.0 cm ground glass opacity RUL  . Myocardial infarction ([place]) 2003  . Pericardial effusion    a. Small by echo 11/2011.  . S/P aortic valve replacement with bioprosthetic valve and maze procedure 04/12/2013   [facility]   . S/P Maze operation for atrial fibrillation 04/12/2013   Complete bilateral atrial lesion set using bipolar radiofrequency and cryothermy ablation with clipping of LA appendage  . Sleep apnea    Pt says testing was positive, intolerant of CPAP.  .Marland KitchenStreptococcal infection group D enterococcus    Recurrent Enterococcus bacteremia status post removal of infected graft on May 07, 2008, with removal of PermCath and subsequent replacement  06/2008.  . [place] ([place]   . Type II diabetes mellitus ([place]     Past Surgical History:  Procedure Laterality Date  . AORTIC VALVE REPLACEMENT N/A 04/12/2013   Procedure: AORTIC VALVE REPLACEMENT (AVR);  Surgeon: CRexene Alberts MD;  [place]  Service: Open Heart Surgery;  Laterality: N/A;  . ARTERIOVENOUS GRAFT PLACEMENT Left   . ARTERIOVENOUS GRAFT PLACEMENT Left    "I've had 2 on my left; had one removed" (04/05/2013)   . ARTERY EXPLORATION Right 04/11/2013   Procedure: ARTERY EXPLORATION;  Surgeon: CRexene Alberts MD;  [place]  Service: Open Heart Surgery;  Laterality: Right;  Right carotid artery exploration  . AV FISTULA PLACEMENT Right   . AV FISTULA REPAIR Right    "took it out" ((/18/2014)  . CARDIOVERSION  05/29/2012   Procedure: CARDIOVERSION;  Surgeon: BLelon Perla MD;  [facility]  Service: Cardiovascular;  Laterality: N/A;  . CHOLECYSTECTOMY  2009   with hernia removal  . CORONARY ANGIOPLASTY WITH STENT PLACEMENT Right    coronary artery  . INSERTION OF DIALYSIS CATHETER Bilateral    "over the years; took them both out" (04/05/2013)  . INTRAOPERATIVE TRANSESOPHAGEAL ECHOCARDIOGRAM N/A 04/11/2013   Procedure: INTRAOPERATIVE TRANSESOPHAGEAL ECHOCARDIOGRAM;  Surgeon: CRexene Alberts MD;  [place]  Service: Open Heart Surgery;  Laterality: N/A;  . INTRAOPERATIVE TRANSESOPHAGEAL ECHOCARDIOGRAM N/A 04/12/2013   Procedure: INTRAOPERATIVE TRANSESOPHAGEAL ECHOCARDIOGRAM;  Surgeon: CRexene Alberts MD;  [place]  Service: Open Heart Surgery;  Laterality: N/A;  . KIDNEY TRANSPLANT  03/16/11  . LEFT AND RIGHT HEART CATHETERIZATION WITH CORONARY ANGIOGRAM N/A 04/06/2013   Procedure: LEFT AND RIGHT HEART CATHETERIZATION WITH CORONARY ANGIOGRAM;  Surgeon:  Blane Ohara, MD;  Location: [facility] CATH LAB;  Service: Cardiovascular;  Laterality: N/A;  . MAZE N/A 04/12/2013   Procedure: MAZE;  Surgeon: Rexene Alberts, MD;  Location: [place];   Service: Open Heart Surgery;  Laterality: N/A;  . NASAL RECONSTRUCTION WITH SEPTAL REPAIR     "took it out" (04/05/2013)  . NEPHRECTOMY Left 2010   no CA on bx  . TONSILLECTOMY    . TOTAL ABDOMINAL HYSTERECTOMY    . TUBAL LIGATION      Family Hx:  Family History  Problem Relation Age of Onset  . Stroke Father   . Hypertension Mother   . Diabetes Other   . Diabetes Maternal Grandmother   . Diabetes Son   . Crohn's disease Other        grandson   . Breast cancer Neg Hx   . Stomach cancer Neg Hx   . Esophageal cancer Neg Hx   . Colon cancer Neg Hx   . Pancreatic cancer Neg Hx     Social History:  reports that she quit smoking about 18 years ago. Her smoking use included cigarettes. She has a 30.00 pack-year smoking history. She has never used smokeless tobacco. She reports that she does not drink alcohol or use drugs.  Allergies:  Allergies  Allergen Reactions  . Ibuprofen Nausea And Vomiting  . Sulfamethoxazole-Trimethoprim Itching, Swelling and Rash    Swelling of the face  . Sulfonamide Derivatives Itching, Swelling and Rash    Swelling of the face  . Tape Rash    Paper tape is ok  . Tramadol Nausea And Vomiting  . Doxycycline Nausea Only  . Hydrocil [Psyllium] Nausea And Vomiting  . Bactrim Itching, Swelling and Rash  . Red Dye Itching and Rash    Medications: Prior to Admission medications   Medication Sig Start Date End Date Taking? Authorizing Provider  acetaminophen (TYLENOL) 500 MG tablet Take 500 mg by mouth every 6 (six) hours as needed for mild pain or headache.   Yes [provider]  amoxicillin (AMOXIL) 500 MG capsule TAKE 4 CAPSULES BY MOUTH 1 HOUR PRIOR TO DENTAL WORK Patient taking differently: Take 2,000 mg by mouth See admin instructions. TAKE 4 CAPSULES BY MOUTH 1 HOUR PRIOR TO DENTAL WORK 05/24/19  Yes Plotnikov, [name], MD  dapsone 25 MG tablet Take 25 mg by mouth daily.    Yes [provider]  HYDROcodone-acetaminophen  (NORCO/VICODIN) 5-325 MG tablet Take 1 tablet by mouth every 6 (six) hours as needed for severe pain. 08/20/19 08/19/20 Yes Plotnikov, [name], MD  Insulin Glargine, 1 Unit Dial, (TOUJEO SOLOSTAR) 300 UNIT/ML SOPN Inject 24 Units into the skin every morning. Titrate up by 1 unit a day if needed for goal sugars of 100-130 up to 30 units a day max 03/14/19  Yes Plotnikov, [name], MD  levothyroxine (SYNTHROID) 25 MCG tablet TAKE 2 TABLETS (50 MCG TOTAL) BY MOUTH DAILY. Patient taking differently: Take 50 mcg by mouth daily before breakfast.  12/18/18  Yes Plotnikov, [name], MD  metoprolol tartrate (LOPRESSOR) 25 MG tablet Take 0.5 tablets (12.5 mg total) by mouth 2 (two) times daily. 08/22/19  Yes Sherran Needs, NP  potassium chloride SA (KLOR-CON M20) 20 MEQ tablet Take 1 tablet (20 mEq total) by mouth daily. 09/19/19 12/18/19 Yes Cleaver, Jossie Ng, NP  predniSONE (DELTASONE) 5 MG tablet Take 5 mg by mouth daily.  03/21/18  Yes [provider]  Propylene Glycol (SYSTANE BALANCE)  0.6 % SOLN Place 1-2 drops into both eyes 3 (three) times daily as needed (for dryness).   Yes [provider]  rosuvastatin (CRESTOR) 20 MG tablet Take 1 tablet (20 mg total) by mouth daily. Patient taking differently: Take 20 mg by mouth every other day.  03/21/19  Yes Lelon Perla, MD  spironolactone (ALDACTONE) 25 MG tablet Take 1 tablet (25 mg total) by mouth daily. 12/27/18  Yes Pokhrel, Laxman, MD  tacrolimus (PROGRAF) 1 MG capsule Take 3 mg by mouth 2 (two) times daily.    Yes [provider]  warfarin (COUMADIN) 5 MG tablet Take 1/2 to 1 tablet by mouth daily as directed by coumadin clinic. Patient taking differently: Take 2.5-5 mg by mouth daily at 6 PM. 1/2 tablet on Mondays, 1 tablet all other days 05/22/19  Yes Crenshaw, Denice Bors, MD  Insulin Pen Needle (B-D UF III MINI PEN NEEDLES) 31G X 5 MM MISC USE TO ADMINISTER INSULIN FOUR TIMES A DAY DX E11.9 11/11/17   Plotnikov, [name], MD  [facility]  DELICA LANCETS 61W MISC Use to check blood sugars three times a day DX E11.9 10/12/17   Plotnikov, [name], MD  ONETOUCH VERIO test strip USE TO TEST 3 TIMES DAILY. DX E11.9 Patient taking differently: 1 each by Other route 3 (three) times daily. DX: E11.9 10/20/18   Hoyt Koch, MD  OXYGEN Inhale 3 L into the lungs as needed (for shortness of breath).     [provider]    I have reviewed the patient's reported prior to admission and current medications.  Labs:  BMP Latest Ref Rng & Units 10/02/2019 10/01/2019 06/15/2019  Glucose 70 - 99 mg/dL 164(H) 326(H) 155(H)  BUN 8 - 23 mg/dL 67(H) 69(H) 27(H)  Creatinine 0.44 - 1.00 mg/dL 2.74(H) 2.92(H) 1.70(H)  BUN/Creat Ratio 12 - 28 - - -  Sodium 135 - 145 mmol/L 139 138 140  Potassium 3.5 - 5.1 mmol/L 4.2 3.7 4.2  Chloride 98 - 111 mmol/L 103 101 107  CO2 22 - 32 mmol/L 24 23 24   Calcium 8.9 - 10.3 mg/dL 9.3 9.4 9.9    Urinalysis    Component Value Date/Time   COLORURINE YELLOW 10/01/2019 2232   APPEARANCEUR HAZY (A) 10/01/2019 2232   LABSPEC 1.010 10/01/2019 2232   PHURINE 6.0 10/01/2019 2232   GLUCOSEU 50 (A) 10/01/2019 2232   GLUCOSEU 100 (A) 07/02/2016 1440   HGBUR MODERATE (A) 10/01/2019 2232   BILIRUBINUR NEGATIVE 10/01/2019 2232   KETONESUR NEGATIVE 10/01/2019 2232   PROTEINUR >=300 (A) 10/01/2019 2232   UROBILINOGEN 0.2 07/02/2016 1440   NITRITE NEGATIVE 10/01/2019 2232   LEUKOCYTESUR LARGE (A) 10/01/2019 2232     ROS:  Pertinent items noted in HPI and remainder of comprehensive ROS otherwise negative.    Physical Exam: Vitals:   10/02/19 1036 10/02/19 1100  BP: (!) 152/77 (!) 159/94  Pulse: (!) 38 (!) 52  Resp: (!) 25 19  Temp:    SpO2: 96% 97%     General: elderly female in bed in NAD  HEENT: NCAT Eyes: EOMI sclera anicteric  Neck: supple trachea midline  Heart: S1S2 no rub Lungs: basilar crackles; unlabored at rest; on 2.5 liter oxygen Abdomen: soft/NT/ND Extremities: 1-2+ edema  bilateral lower extremities Skin: no rash on extremities exposed Neuro: alert and oriented x 3 provides hx follows commands Psych normal mood and affect - insight appears impaired  Assessment/Plan:  # Acute on chronic diastolic CHF  - Follow  on current lasix regimen for now - may need a higher dose - on supp oxygen   # CKD stage IV   - 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; 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  - with progression   # Renal transplant  - on immunosuppression as below  - s/p recent eval with [facility] per primary nephrologist and not felt to need transplant biopsy    # Immunosuppression - on prednisone and prograf - continue but adjust dosing to every 12 hours 8am/8pm - Has been on dapsone prophylaxis  # Hypertension - Would hold spironolactone for now  - Getting lasix IV  # Chronic hypoxic resp failure - on supplemental oxygen   # Leukocyturia - on ceftriaxone  - Note culture is in process  # Anemia - normocytic - mild at present   Claudia Desanctis 10/02/2019, 12:54 PM   Disposition per primary team.  Spoke with primary team and they are more inclined to keep her here.  I agree.  Still awaiting PT eval to see if would qualify for program.   Claudia Desanctis 10/02/2019 12:55 PM

## 2019-10-02 NOTE — Progress Notes (Signed)
Hospitalist progress note   Patient from home, DVT prophylaxis Lovenox, Dispo likely will need to discharge home once stabilizes and renal function can be reevaluated  [name] [id number] DOB: [birth year] DOA: 10/01/2019  PCP: [name], MD   Narrative:  74 year old black female ESRD cadaveric transplant 2012 CAD RCA stent in the past HTN debility using chronic oxygen at home admitted with S OB generalized weakness BNP found to be 2000 creatinine up from baseline 1.8-->2.9 Initially felt to be appropriate for discharge with heart failure at home clinic but felt less appropriate subsequently and kept in hospital  Data Reviewed:  BUN/creatinine 69/2.9-->67/2.7 GFR 60 INR 2 A1c 9 Hemoglobin 11.5 platelet 237  IMPRESSIONS    1. Left ventricular ejection fraction, by estimation, is 60 to 65%. The  left ventricle has low normal function. The left ventricle has no regional  wall motion abnormalities. There is mild left ventricular hypertrophy.  Left ventricular diastolic function  could not be evaluated. Left ventricular diastolic function could not be  evaluated. Elevated left ventricular end-diastolic pressure.  2. Right ventricular systolic function is normal. The right ventricular  size is normal. There is severely elevated pulmonary artery systolic  pressure. The estimated right ventricular systolic pressure is 62.8 mmHg.  3. Left atrial size was moderately dilated.  4. Right atrial size was mildly dilated.  5. The mitral valve is abnormal. Mild to moderate mitral valve  regurgitation.  6. Tricuspid valve regurgitation is mild to moderate.  7. The aortic valve has been repaired/replaced. Aortic valve  regurgitation is trivial. There is a 23 mm Edwards bovine valve present in  the aortic position. Procedure Date: 04/12/2013. Aortic valve area, by VTI  measures 1.66 cm. Aortic valve mean  gradient measures 13.0 mmHg. Aortic valve Vmax measures 2.72 m/s.  8.  The inferior vena cava is dilated in size with <50% respiratory  variability, suggesting right atrial pressure of 15 mmHg.   Comparison(s): Changes from prior study are noted. 12/20/2018: LVEF 60-65%,  RVSP 44 mmHg.   Assessment & Plan:  Decompensated diastolic heart failure with underlying probably cardiorenal syndrome Very elevated RVSP 82.9 mm Diuresis according to nephrology-Aldactone held-continue metoprolol 12.5 twice daily, Lasix IV 40 mg twice daily now Net negative balance cautiously in setting of renal issues CKD 3B-4 Cadaveric transplant 2012 Continue prednisone Dapsone Prograf-levels as per renal Not currently needing dialysis however diuretics to be adjusted by nephrology and heart failure team Aortic valve repair with current valve area 1.66 Might play into some of her heart failure Defer to cardiology further planning ? UTI Follow cultures although unlikely to be infected continue ceftriaxone DM TY 2 A1c 9 Sugars 81 2018 continue Lantus 30 and sliding scale coverage  Subjective: Feels fair Diarrhea 600 cc No chest pain no fever No chills Consultants:   Cardiology  Renal  Objective: Vitals:   10/02/19 1200 10/02/19 1300 10/02/19 1459 10/02/19 1500  BP: (!) 173/89 (!) 166/83 (!) 116/102   Pulse: 67 94 79   Resp: (!) 29 (!) 22 15   Temp:   98.2 F (36.8 C)   TempSrc:   Oral   SpO2: 97% (!) 82%    Weight:    81.2 kg  Height:    5' (1.524 m)    Intake/Output Summary (Last 24 hours) at 10/02/2019 1626 Last data filed at 10/02/2019 1058 Gross per 24 hour  Intake -  Output 1850 ml  Net -1850 ml   Filed Weights   10/01/19 2124 10/02/19  1500  Weight: 81.6 kg 81.2 kg    Examination: Awake alert coherent frail African-American female no distress EOMI NCAT no focal deficit Chest clear P7-H2 holosystolic murmur Abdomen soft nontender no rebound no guarding No lower extremity edema  Scheduled Meds: . dapsone  25 mg Oral Daily  . furosemide  40 mg  Intravenous BID  . insulin aspart  0-15 Units Subcutaneous TID WC  . insulin aspart  0-5 Units Subcutaneous QHS  . insulin glargine  30 Units Subcutaneous Daily  . levothyroxine  50 mcg Oral QAC breakfast  . metoprolol tartrate  12.5 mg Oral BID  . predniSONE  5 mg Oral Q breakfast  . rosuvastatin  20 mg Oral QODAY  . sodium chloride flush  3 mL Intravenous Q12H  . tacrolimus  3 mg Oral BID  . [START ON 10/08/2019] warfarin  2.5 mg Oral Once per day on Mon  . warfarin  5 mg Oral Once per day on Sun Tue Wed Thu Fri  . Warfarin - Pharmacist Dosing Inpatient   Does not apply q1800   Continuous Infusions: . sodium chloride    . cefTRIAXone (ROCEPHIN)  IV       LOS: 0 days   Time spent: [place], MD [facility]  10/02/2019, 4:26 PM

## 2019-10-02 NOTE — ED Notes (Signed)
Lunch Tray Ordered @ 1056. 

## 2019-10-02 NOTE — Progress Notes (Signed)
ANTICOAGULATION CONSULT NOTE - Follow-Up Consult  Pharmacy Consult for Warfarin  Indication: atrial fibrillation  Allergies  Allergen Reactions  . Ibuprofen Nausea And Vomiting  . Sulfamethoxazole-Trimethoprim Itching, Swelling and Rash    Swelling of the face  . Sulfonamide Derivatives Itching, Swelling and Rash    Swelling of the face  . Tape Rash    Paper tape is ok  . Tramadol Nausea And Vomiting  . Doxycycline Nausea Only  . Hydrocil [Psyllium] Nausea And Vomiting  . Bactrim Itching, Swelling and Rash  . Red Dye Itching and Rash    Patient Measurements: Height: 5' (152.4 cm) Weight: 180 lb (81.6 kg) IBW/kg (Calculated) : 45.5  Vital Signs: BP: 179/99 (03/16 0807) Pulse Rate: 65 (03/16 0807)  Labs: Recent Labs    10/01/19 1724 10/01/19 2125 10/02/19 0301  HGB 11.5*  --   --   HCT 39.8  --   --   PLT 237  --   --   LABPROT  --  22.1* 22.4*  INR  --  2.0* 2.0*  CREATININE 2.92*  --  2.74*    Estimated Creatinine Clearance: 16.8 mL/min (A) (by C-G formula based on SCr of 2.74 mg/dL (H)).   Medical History: Past Medical History:  Diagnosis Date  . Acute CHF (congestive heart failure) ([place]) 12/2018  . Adenocarcinoma of lung, stage 1, right ([place]) 11/25/2017  . Anemia, iron deficiency    of chronic disease  . Aortic stenosis    a. Severe AS by echo 11/2012.  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 Asystole ([place])    a. During ENT surgery 2005: developed marked asystole requiring CPR, felt due to vagal reaction (cath nonobst dz).  . Carotid artery disease ([place])    a. Carotid Dopplers performed in August 2013 showed 40-59% left stenosis and 0-39% right; f/u recommended in 2 years.   . Cerebrovascular accident [facility]) 2009   a. LMCA infarct felt embolic 6962, maintained on chronic coumadin.; denies residual on 04/05/2013  . Cholelithiasis   . Chronic Persistent Atrial Fibrillation 12/31/2008   Qualifier: Diagnosis of  By: [name]    .  Coronary artery disease 05/2002   a. Ant MI 2003 s/p PTCA/stent to RCA.   . Diverticulosis of colon   . Esophagitis, reflux   . ESRD (end stage renal disease) ([place])    a. Mass on L kidney per pt s/p nephrectomy - pt states not cancer - [facility] notes indicate ESRD due to HTN/DM - was previously on HD. b. Kidney transplant 02/2011.  Marland Kitchen GERD (gastroesophageal reflux disease)   . Gout   . Hearing loss   . Helicobacter pylori (H. pylori) infection    hx of  . Hemorrhoids   . Hx of colonic polyps    adenomatous  . Hyperlipidemia   . Hypertension   . Lung nodule seen on imaging study 04/07/2013   1.0 cm ground glass opacity RUL  . Myocardial infarction ([place]) 2003  . Pericardial effusion    a. Small by echo 11/2011.  . S/P aortic valve replacement with bioprosthetic valve and maze procedure 04/12/2013   8mm [facility] Ease bovine pericardial tissue valve   . S/P Maze operation for atrial fibrillation 04/12/2013   Complete bilateral atrial lesion set using bipolar radiofrequency and cryothermy ablation with clipping of LA appendage  . Sleep apnea    Pt says testing was positive, intolerant of CPAP.  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  . Stroke ([place])   . Type II diabetes mellitus [facility])    Assessment: 74 y/o F with chief complaint of bilateral lower extremity swelling, on warfarin PTA for hx Afib/CVA.  Warfarin regimen per outpatient anti-coag notes: 2.5 mg on Mondays, 5 mg all other days  INR today remains therapeutic (INR 2, goal of 2-3). No CBC yet today. The patient has been continued on her home regimen.   Goal of Therapy:  INR 2-3 Monitor platelets by anticoagulation protocol: Yes   Plan:  - Warfarin 5 mg x 1 as per PTA dosing - Daily PT/INR, CBC q72h - Will continue to monitor for any signs/symptoms of bleeding and  will follow up with PT/INR in the a.m.    Thank you for allowing pharmacy to be a part of this patient's care.  Alycia Rossetti, PharmD, BCPS Clinical Pharmacist Clinical phone for 10/02/2019: [phone] 10/02/2019 9:29 AM   **Pharmacist phone directory can now be found on [email or web address] ([email or web address]).  Listed under [place].

## 2019-10-03 DIAGNOSIS — I480 Paroxysmal atrial fibrillation: Secondary | ICD-10-CM

## 2019-10-03 DIAGNOSIS — N179 Acute kidney failure, unspecified: Secondary | ICD-10-CM

## 2019-10-03 DIAGNOSIS — I5033 Acute on chronic diastolic (congestive) heart failure: Secondary | ICD-10-CM

## 2019-10-03 DIAGNOSIS — N184 Chronic kidney disease, stage 4 (severe): Secondary | ICD-10-CM

## 2019-10-03 LAB — GLUCOSE, CAPILLARY
Glucose-Capillary: 133 mg/dL — ABNORMAL HIGH (ref 70–99)
Glucose-Capillary: 95 mg/dL (ref 70–99)
Glucose-Capillary: 118 mg/dL — ABNORMAL HIGH (ref 70–99)
Glucose-Capillary: 150 mg/dL — ABNORMAL HIGH (ref 70–99)
Glucose-Capillary: 246 mg/dL — ABNORMAL HIGH (ref 70–99)

## 2019-10-03 LAB — URINALYSIS, COMPLETE (UACMP) WITH MICROSCOPIC
Bilirubin Urine: NEGATIVE
Glucose, UA: NEGATIVE mg/dL
Ketones, ur: NEGATIVE mg/dL
Nitrite: NEGATIVE
Protein, ur: 100 mg/dL — AB
RBC / HPF: >50 RBC/hpf — ABNORMAL HIGH (ref 0–5)
Specific Gravity, Urine: 1.012 (ref 1.005–1.030)
WBC, UA: >50 WBC/hpf — ABNORMAL HIGH (ref 0–5)
pH: 6 (ref 5.0–8.0)

## 2019-10-03 LAB — CBC WITH DIFFERENTIAL/PLATELET
Abs Immature Granulocytes: 0.03 10*3/uL (ref 0.00–0.07)
Basophils Absolute: 0 10*3/uL (ref 0.0–0.1)
Basophils Relative: 0 %
Eosinophils Absolute: 0.1 10*3/uL (ref 0.0–0.5)
Eosinophils Relative: 2 %
HCT: 37.1 % (ref 36.0–46.0)
Hemoglobin: 10.9 g/dL — ABNORMAL LOW (ref 12.0–15.0)
Immature Granulocytes: 0 %
Lymphocytes Relative: 8 %
Lymphs Abs: 0.6 10*3/uL — ABNORMAL LOW (ref 0.7–4.0)
MCH: 25.3 pg — ABNORMAL LOW (ref 26.0–34.0)
MCHC: 29.4 g/dL — ABNORMAL LOW (ref 30.0–36.0)
MCV: 86.1 fL (ref 80.0–100.0)
Monocytes Absolute: 0.8 10*3/uL (ref 0.1–1.0)
Monocytes Relative: 11 %
Neutro Abs: 6.2 10*3/uL (ref 1.7–7.7)
Neutrophils Relative %: 79 %
Platelets: 249 10*3/uL (ref 150–400)
RBC: 4.31 MIL/uL (ref 3.87–5.11)
RDW: 20.2 % — ABNORMAL HIGH (ref 11.5–15.5)
WBC: 7.8 10*3/uL (ref 4.0–10.5)
nRBC: 0 % (ref 0.0–0.2)

## 2019-10-03 LAB — URINE CULTURE: Culture: <10000 — AB

## 2019-10-03 LAB — RENAL FUNCTION PANEL
Albumin: 2.4 g/dL — ABNORMAL LOW (ref 3.5–5.0)
Anion gap: 13 (ref 5–15)
BUN: 64 mg/dL — ABNORMAL HIGH (ref 8–23)
CO2: 27 mmol/L (ref 22–32)
Calcium: 9.2 mg/dL (ref 8.9–10.3)
Chloride: 101 mmol/L (ref 98–111)
Creatinine, Ser: 2.85 mg/dL — ABNORMAL HIGH (ref 0.44–1.00)
GFR calc Af Amer: 18 mL/min — ABNORMAL LOW (ref >60)
GFR calc non Af Amer: 16 mL/min — ABNORMAL LOW (ref >60)
Glucose, Bld: 107 mg/dL — ABNORMAL HIGH (ref 70–99)
Phosphorus: 4.7 mg/dL — ABNORMAL HIGH (ref 2.5–4.6)
Potassium: 3.3 mmol/L — ABNORMAL LOW (ref 3.5–5.1)
Sodium: 141 mmol/L (ref 135–145)

## 2019-10-03 LAB — PROTIME-INR
INR: 2.6 — ABNORMAL HIGH (ref 0.8–1.2)
Prothrombin Time: 27.6 seconds — ABNORMAL HIGH (ref 11.4–15.2)

## 2019-10-03 MED ORDER — FUROSEMIDE 10 MG/ML IJ SOLN
60.0000 mg | Freq: Two times a day (BID) | INTRAMUSCULAR | Status: DC
  Administered 2019-10-03: 60 mg via INTRAVENOUS
  Filled 2019-10-03 (×2): qty 6

## 2019-10-03 MED ORDER — WARFARIN SODIUM 5 MG PO TABS
5.0000 mg | ORAL_TABLET | ORAL | Status: DC
  Administered 2019-10-04 – 2019-10-05 (×2): 5 mg via ORAL
  Filled 2019-10-03: qty 1

## 2019-10-03 MED ORDER — POTASSIUM CHLORIDE CRYS ER 20 MEQ PO TBCR
40.0000 meq | EXTENDED_RELEASE_TABLET | Freq: Every day | ORAL | Status: DC
  Administered 2019-10-03: 40 meq via ORAL
  Filled 2019-10-03: qty 2

## 2019-10-03 MED ORDER — WARFARIN SODIUM 2.5 MG PO TABS
2.5000 mg | ORAL_TABLET | Freq: Once | ORAL | Status: AC
  Administered 2019-10-03: 2.5 mg via ORAL
  Filled 2019-10-03: qty 1

## 2019-10-03 NOTE — TOC Initial Note (Addendum)
Transition of Care [facility]) - Initial/Assessment Note    Patient Details  Name: [name] MRN: [id number] Date of Birth: October 08, 1945  [facility]) CM/SW Contact:    Zenon Mayo, RN Phone Number: 10/03/2019, 12:19 PM  Clinical Narrative:                 NCM spoke with patient, offered choice with medicare . gov list , she states she does not have a preference.  NCM made referral to [facility] with Malachy Mood. She is able to take for [facility], Soc will begin 24 to 48 hrs post dc. Will need HHRN orders prior to dc and HHaide.    3/22- NCM asked Malachy Mood with [facility] to add a aide to services.   Expected Discharge Plan: [place] Barriers to Discharge: Continued Medical Work up   Patient Goals and CMS Choice Patient states their goals for this hospitalization and ongoing recovery are:: to get better CMS Medicare.gov Compare Post Acute Care list provided to:: Patient Choice offered to / list presented to : Patient  Expected Discharge Plan and Services Expected Discharge Plan: [place]   Discharge Planning Services: CM Consult Post Acute Care Choice: [place] arrangements for the past 2 months: Single Family Home                           HH Arranged: RN [place] Agency: [place] Date [place]: 10/03/19 Time HH Agency Contacted: 1219 Representative spoke with at [place]: [place] Arrangements/Services Living arrangements for the past 2 months: [place] with:: Spouse Patient language and need for interpreter reviewed:: Yes Do you feel safe going back to the place where you live?: Yes      Need for Family Participation in Patient Care: Yes (Comment) Care giver support system in place?: Yes (comment) Current home services: DME(has home oxygen 3 liters) Criminal Activity/Legal Involvement Pertinent to Current Situation/Hospitalization: No - Comment as  needed  Activities of Daily Living Home Assistive Devices/Equipment: Cane (specify quad or straight) ADL Screening (condition at time of admission) Patient's cognitive ability adequate to safely complete daily activities?: Yes Is the patient deaf or have difficulty hearing?: No Does the patient have difficulty seeing, even when wearing glasses/contacts?: No Does the patient have difficulty concentrating, remembering, or making decisions?: No Patient able to express need for assistance with ADLs?: Yes Does the patient have difficulty dressing or bathing?: Yes Independently performs ADLs?: Yes (appropriate for developmental age) Does the patient have difficulty walking or climbing stairs?: Yes Weakness of Legs: Both Weakness of Arms/Hands: None  Permission Sought/Granted                  Emotional Assessment Appearance:: Appears stated age Attitude/Demeanor/Rapport: Engaged Affect (typically observed): Appropriate Orientation: : Oriented to Self, Oriented to Place, Oriented to  Time, Oriented to Situation Alcohol / Substance Use: Not Applicable Psych Involvement: No (comment)  Admission diagnosis:  Acute cystitis with hematuria [N30.01] AKI (acute kidney injury) ([place]) [N17.9] Heart failure ([place]) [I50.9] Acute on chronic diastolic CHF (congestive heart failure) ([place]) [I50.33] Acute on chronic congestive heart failure, unspecified heart failure type ([place]) [I50.9] Patient Active Problem List   Diagnosis Date Noted  . Heart failure ([place]) 10/02/2019  . Dyspnea 08/20/2019  . COPD (chronic obstructive pulmonary disease) ([place]) 08/20/2019  . Hematoma of arm, left, subsequent encounter 07/23/2019  . Obesity (BMI  30-39.9) 12/20/2018  . Acute respiratory failure with hypoxia ([place]) 12/19/2018  . Gait disorder 11/22/2018  . History of seizure disorder 09/12/2018  . History of stroke 09/12/2018  . Edema 08/28/2018  . Hypertension secondary to other renal disorders 03/22/2018  .  Proteinuria 03/22/2018  . Malignant neoplasm of lung ([place]) 03/22/2018  . Breast pain in female 02/04/2018  . Lung cancer ([place]) 11/30/2017  . Adenocarcinoma of lung, stage 1, right ([place]) 11/25/2017  . Chronic respiratory failure with hypoxia ([place]) 12/27/2016  . Acute on chronic diastolic CHF (congestive heart failure) ([place]) 12/27/2016  . Left upper extremity swelling 12/27/2016  . Pain of left breast 12/27/2016  . Arm muscle atrophy 11/16/2016  . Insomnia 02/25/2016  . Renal cyst 11/21/2015  . Renal mass, right 11/10/2015  . Shoulder pain, right 08/13/2015  . Weight gain 08/12/2014  . Multinodular goiter 05/01/2014  . Encounter for therapeutic drug monitoring 08/14/2013  . S/P aortic valve replacement with bioprosthetic valve and maze procedure 04/12/2013  . Nodule of right lung 04/07/2013  . Lung nodule seen on imaging study 04/07/2013  . Pain in limb 05/22/2012  . Dermatitis 01/26/2012  . Long term (current) use of anticoagulants 08/17/2011  . Anemia in chronic renal disease 05/13/2011  . Hypokalemia 04/26/2011  . Immunosuppression ([place]) 04/26/2011  . Hypertension associated with diabetes ([place]) 04/26/2011  . Renal transplant recipient 03/16/2011  . Low back pain 02/23/2011  . Hypersalivation 11/24/2010  . AORTIC STENOSIS 08/20/2010  . DISTURBANCE OF SALIVARY SECRETION 07/28/2010  . NAUSEA 04/14/2010  . SMOKER 10/07/2009  . ALOPECIA 10/07/2009  . CAROTID STENOSIS 03/04/2009  . AF (paroxysmal atrial fibrillation) ([place]) 12/31/2008  . NEOPLASM, MALIGNANT, KIDNEY 07/02/2008  . APHASIA DUE TO CEREBROVASCULAR DISEASE 07/02/2008  . Chronic fatigue 05/21/2008  . Hyperlipidemia LDL goal <70 09/27/2007  . Constipation 09/27/2007  . SLEEP APNEA 09/27/2007  . CHOLELITHIASIS 06/09/2007  . HELICOBACTER PYLORI INFECTION, HX OF 06/08/2007  . Type 2 diabetes mellitus with renal manifestations ([place]) 05/23/2007  . Gout 05/23/2007  . 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  MYOCARDIAL INFARCTION, HX OF 05/23/2007  . Coronary atherosclerosis 05/23/2007  . GERD 05/23/2007  . COLONIC POLYPS, HX OF 05/23/2007  . DIVERTICULOSIS, COLON 07/01/2005   PCP:  [name], MD Pharmacy:   [facility], [address] [place] [address] Phone: [phone] Fax: [phone]  [id number] [place], [place] [place] [address] [place] [address] Phone: [phone] Fax: [phone]     Social Determinants of Health (SDOH) Interventions    Readmission Risk Interventions Readmission Risk Prevention Plan 10/03/2019  Transportation Screening Complete  Medication Review ([place]) Complete  HRI or [place] Complete  SW Recovery Care/Counseling Consult Complete  [place] Not Applicable  Some recent data might be hidden

## 2019-10-03 NOTE — Progress Notes (Signed)
ANTICOAGULATION CONSULT NOTE - Follow-Up Consult  Pharmacy Consult for Warfarin  Indication: atrial fibrillation  Allergies  Allergen Reactions  . Ibuprofen Nausea And Vomiting  . Sulfamethoxazole-Trimethoprim Itching, Swelling and Rash    Swelling of the face  . Sulfonamide Derivatives Itching, Swelling and Rash    Swelling of the face  . Tape Rash    Paper tape is ok  . Tramadol Nausea And Vomiting  . Doxycycline Nausea Only  . Hydrocil [Psyllium] Nausea And Vomiting  . Bactrim Itching, Swelling and Rash  . Red Dye Itching and Rash    Patient Measurements: Height: 5' (152.4 cm) Weight: 181 lb 6.4 oz (82.3 kg) IBW/kg (Calculated) : 45.5  Vital Signs: Temp: 98.8 F (37.1 C) (03/17 0748) Temp Source: Oral (03/17 0748) BP: 161/79 (03/17 0748) Pulse Rate: 63 (03/17 0748)  Labs: Recent Labs    10/01/19 1724 10/01/19 2125 10/02/19 0301 10/03/19 0609 10/03/19 0635  HGB 11.5*  --   --  10.9*  --   HCT 39.8  --   --  37.1  --   PLT 237  --   --  249  --   LABPROT  --  22.1* 22.4* 27.6*  --   INR  --  2.0* 2.0* 2.6*  --   CREATININE 2.92*  --  2.74*  --  2.85*    Estimated Creatinine Clearance: 16.2 mL/min (A) (by C-G formula based on SCr of 2.85 mg/dL (H)).   Medical History: Past Medical History:  Diagnosis Date  . Acute CHF (congestive heart failure) ([place]) 12/2018  . Adenocarcinoma of lung, stage 1, right ([place]) 11/25/2017  . Anemia, iron deficiency    of chronic disease  . Aortic stenosis    a. Severe AS by echo 11/2012.  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 Asystole ([place])    a. During ENT surgery 2005: developed marked asystole requiring CPR, felt due to vagal reaction (cath nonobst dz).  . Carotid artery disease ([place])    a. Carotid Dopplers performed in August 2013 showed 40-59% left stenosis and 0-39% right; f/u recommended in 2 years.   . Cerebrovascular accident [facility]) 2009   a. LMCA infarct felt embolic 7673, maintained on  chronic coumadin.; denies residual on 04/05/2013  . Cholelithiasis   . Chronic Persistent Atrial Fibrillation 12/31/2008   Qualifier: Diagnosis of  By: [name]    . Coronary artery disease 05/2002   a. Ant MI 2003 s/p PTCA/stent to RCA.   . Diverticulosis of colon   . Esophagitis, reflux   . ESRD (end stage renal disease) ([place])    a. Mass on L kidney per pt s/p nephrectomy - pt states not cancer - [facility] notes indicate ESRD due to HTN/DM - was previously on HD. b. Kidney transplant 02/2011.  Marland Kitchen GERD (gastroesophageal reflux disease)   . Gout   . Hearing loss   . Helicobacter pylori (H. pylori) infection    hx of  . Hemorrhoids   . Hx of colonic polyps    adenomatous  . Hyperlipidemia   . Hypertension   . Lung nodule seen on imaging study 04/07/2013   1.0 cm ground glass opacity RUL  . Myocardial infarction ([place]) 2003  . Pericardial effusion    a. Small by echo 11/2011.  . S/P aortic valve replacement with bioprosthetic valve and maze procedure 04/12/2013   61mm [facility] Ease bovine pericardial tissue valve   . S/P Maze operation for atrial fibrillation  04/12/2013   Complete bilateral atrial lesion set using bipolar radiofrequency and cryothermy ablation with clipping of LA appendage  . Sleep apnea    Pt says testing was positive, intolerant of CPAP.  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  . Stroke ([place])   . Type II diabetes mellitus [facility])    Assessment: 74 y/o F with chief complaint of bilateral lower extremity swelling, on warfarin PTA for hx Afib/CVA.  Warfarin regimen per outpatient anti-coag notes: 2.5 mg on Mondays, 5 mg all other days  INR bumped up today 2.0>2.6, but remains therapeutic (goal of 2-3). No Hgb down slightly, no reported bruising or bleeding. Will give a reduced warfarin dose tonight, then  continue on her home regimen.   Goal of Therapy:  INR 2-3 Monitor platelets by anticoagulation protocol: Yes   Plan:  - Warfarin 2.5 mg x1 tonight - Daily PT/INR, CBC q72h - Will continue to monitor for any signs/symptoms of bleeding and will follow up with PT/INR in the a.m.    Thank you for allowing pharmacy to be a part of this patient's care.  Georga Bora, PharmD Clinical Pharmacist 10/03/2019 8:29 AM  **Pharmacist phone directory can now be found on [email or web address] ([email or web address]).  Listed under [place].

## 2019-10-03 NOTE — Progress Notes (Signed)
[facility]  PROGRESS NOTE  [name] FXJ:[id number] DOB: 08-17-1945 DOA: 10/01/2019 PCP: [name], MD   Brief HPI:   74 year old female with a history of ESRD s/p cadaveric transplant in 2012, CAD with RCA stent in the past, hypertension admitted with shortness of breath, generalized weakness.  BNP found to be elevated 2000, creatinine was up from baseline 1.8-2.9.  Patient mated with decompensated diastolic heart failure.  Nephrology was consulted for help with diuresis considering patient's history of cadaveric transplant 2012, CKD stage IIIb 4.    Subjective   Patient seen and examined, still feels that she has excessive fluid in her legs and breasts, left arm.   Assessment/Plan:     1. Acute on chronic diastolic CHF-patient started on IV Lasix, Lasix dose increased to 60 mg IV every 12 hours.  Will consult cardiology for further management.  Follow BMP in a.m. 2. S/p renal transplant/CKD stage IIIb/IV-s/p cadaveric transplant in 2012, continue prednisone, dapsone, Prograf.  Follow nephrology recommendations. 3. Paroxysmal atrial fibrillation-continue warfarin, metoprolol 4. ?  UTI-urine culture only growing insignificant growth.  Will discontinue IV ceftriaxone. 5. Hypertension-blood pressure stable, continue metoprolol. 6. Diabetes mellitus type 2-continue sliding scale insulin NovoLog, Lantus. 7. Chronic hypoxic respiratory failure-continue supplemental oxygen.    SpO2: 93 % O2 Flow Rate (L/min): 3 L/min   COVID-19 Labs  No results for input(s): DDIMER, FERRITIN, LDH, CRP in the last 72 hours.  Lab Results  Component Value Date   SARSCOV2NAA NEGATIVE 10/01/2019   [place] NEGATIVE 12/19/2018     CBG: Recent Labs  Lab 10/02/19 2136 10/03/19 0636 10/03/19 0756 10/03/19 1110 10/03/19 1742  GLUCAP 145* 133* 95 118* 150*    CBC: Recent Labs  Lab 10/01/19 1724 10/03/19 0609  WBC 6.6 7.8  NEUTROABS  --  6.2  HGB 11.5* 10.9*  HCT 39.8  37.1  MCV 87.1 86.1  PLT 237 641    Basic Metabolic Panel: Recent Labs  Lab 10/01/19 1724 10/02/19 0301 10/03/19 0635  NA 138 139 141  K 3.7 4.2 3.3*  CL 101 103 101  CO2 23 24 27   GLUCOSE 326* 164* 107*  BUN 69* 67* 64*  CREATININE 2.92* 2.74* 2.85*  CALCIUM 9.4 9.3 9.2  MG 2.2  --   --   PHOS  --   --  4.7*     Liver Function Tests: Recent Labs  Lab 10/03/19 0635  ALBUMIN 2.4*        DVT prophylaxis: Warfarin  Code Status: Full code  Family Communication: No family at bedside  Disposition Plan: 74 year old female admitted with diastolic heart failure, has CKD stage IIIb 4.  Both cardiology and nephrology have been consulted.  Barrier to discharge- started on IV Lasix for diuresis.         Scheduled medications:  . dapsone  25 mg Oral Daily  . furosemide  60 mg Intravenous BID  . insulin aspart  0-15 Units Subcutaneous TID WC  . insulin aspart  0-5 Units Subcutaneous QHS  . insulin glargine  30 Units Subcutaneous Daily  . levothyroxine  50 mcg Oral QAC breakfast  . metoprolol tartrate  12.5 mg Oral BID  . predniSONE  5 mg Oral Q breakfast  . rosuvastatin  20 mg Oral QODAY  . sodium chloride flush  3 mL Intravenous Q12H  . tacrolimus  3 mg Oral BID  . [START ON 10/08/2019] warfarin  2.5 mg Oral Once per day on Mon  . [START ON 10/04/2019] warfarin  5 mg Oral Once per day on Sun Tue Wed Thu Fri  . Warfarin - Pharmacist Dosing Inpatient   Does not apply q1800    Consultants: Nephrology  Procedures:    Antibiotics:   Anti-infectives (From admission, onward)   Start     Dose/Rate Route Frequency Ordered Stop   10/02/19 2200  cefTRIAXone (ROCEPHIN) 1 g in sodium chloride 0.9 % 100 mL IVPB     1 g 200 mL/hr over 30 Minutes Intravenous Every 24 hours 10/01/19 2314     10/02/19 1000  dapsone tablet 25 mg     25 mg Oral Daily 10/01/19 2318     10/01/19 2315  cefTRIAXone (ROCEPHIN) 1 g in sodium chloride 0.9 % 100 mL IVPB     1 g 200 mL/hr over  30 Minutes Intravenous  Once 10/01/19 2301 10/01/19 2350       Objective   Vitals:   10/03/19 0405 10/03/19 0748 10/03/19 1107 10/03/19 1746  BP: (!) 151/66 (!) 161/79 (!) 149/73 (!) 161/86  Pulse: (!) 53 63 67 70  Resp: 19 16 20 18   Temp: 97.8 F (36.6 C) 98.8 F (37.1 C)  98.2 F (36.8 C)  TempSrc: Oral Oral  Oral  SpO2: 93% 99% 92% 93%  Weight: 82.3 kg     Height:        Intake/Output Summary (Last 24 hours) at 10/03/2019 1900 Last data filed at 10/03/2019 1300 Gross per 24 hour  Intake 1072.88 ml  Output 1200 ml  Net -127.12 ml    03/15 1901 - 03/17 0700 In: 1026.9 [P.O.:920; I.V.:6.9] Out: 3350 [Urine:3350]  Filed Weights   10/01/19 2124 10/02/19 1500 10/03/19 0405  Weight: 81.6 kg 81.2 kg 82.3 kg    Physical Examination:    General-appears in no acute distress  Heart-S1-S2, regular, no murmur auscultated  Lungs-clear to auscultation bilaterally, no wheezing or crackles auscultated  Abdomen-soft, nontender, no organomegaly  Extremities-bilateral 2+ pitting edema of the lower extremities  Neuro-alert, oriented x3, no focal deficit noted    Data Reviewed:   Recent Results (from the past 240 hour(s))  SARS CORONAVIRUS 2 (TAT 6-24 HRS) Nasopharyngeal Nasopharyngeal Swab     Status: None   Collection Time: 10/01/19  9:29 PM   Specimen: Nasopharyngeal Swab  Result Value Ref Range Status   SARS Coronavirus 2 NEGATIVE NEGATIVE Final    Comment: (NOTE) SARS-CoV-2 target nucleic acids are NOT DETECTED. The SARS-CoV-2 RNA is generally detectable in upper and lower respiratory specimens during the acute phase of infection. Negative results do not preclude SARS-CoV-2 infection, do not rule out co-infections with other pathogens, and should not be used as the sole basis for treatment or other patient management decisions. Negative results must be combined with clinical observations, patient history, and epidemiological information. The expected result is  Negative. Fact Sheet for Patients: SugarRoll.be Fact Sheet for Healthcare Providers: https://www.woods-mathews.com/ This test is not yet approved or cleared by the [place] FDA and  has been authorized for detection and/or diagnosis of SARS-CoV-2 by FDA under an Emergency Use Authorization (EUA). This EUA will remain  in effect (meaning this test can be used) for the duration of the COVID-19 declaration under Section 56 4(b)(1) of the Act, 21 U.S.C. section 360bbb-3(b)(1), unless the authorization is terminated or revoked sooner. Performed at [facility], [place] [address], [place], [place] [address]   Culture, Urine     Status: Abnormal   Collection Time: 10/02/19 12:25 AM   Specimen:  Urine, Clean Catch  Result Value Ref Range Status   Specimen Description URINE, CLEAN CATCH  Final   Special Requests NONE  Final   Culture (A)  Final    <10,000 COLONIES/mL INSIGNIFICANT GROWTH Performed at [facility], [address] [place], [place] [address]    Report Status 10/03/2019 FINAL  Final    No results for input(s): LIPASE, AMYLASE in the last 168 hours. No results for input(s): AMMONIA in the last 168 hours.  Cardiac Enzymes: No results for input(s): CKTOTAL, CKMB, CKMBINDEX, TROPONINI in the last 168 hours. BNP (last 3 results) Recent Labs    12/19/18 1621 12/23/18 0444 10/01/19 1724  BNP 1,637.0* 1,655.2* 2,092.5*    ProBNP (last 3 results) No results for input(s): PROBNP in the last 8760 hours.  Studies:  DG Chest Portable 1 View  Result Date: 10/01/2019 CLINICAL DATA:  74 year old female with shortness of breath. EXAM: PORTABLE CHEST 1 VIEW COMPARISON:  Chest radiograph dated 12/22/2018. FINDINGS: There is stable cardiomegaly. Left atrial appendage occlusive device, median sternotomy wires, and mechanical aortic valve noted. No significant congestion. No focal consolidation, pleural effusion, pneumothorax. Linear  and streaky density in the right upper lobe similar to prior CT and radiograph of 12/22/2018 likely represents scarring or post radiation changes. There is atherosclerotic calcification of the aorta. No acute osseous pathology. IMPRESSION: 1. No acute cardiopulmonary process. 2. Stable cardiomegaly. Electronically Signed   By: Anner Crete M.D.   On: 10/01/2019 20:21   ECHOCARDIOGRAM COMPLETE  Result Date: 10/02/2019    ECHOCARDIOGRAM REPORT   Patient Name:   [name] Date of Exam: 10/02/2019 Medical Rec #:  [id number]    Height:       60.0 in Accession #:    [id number]   Weight:       180.0 lb Date of Birth:  07-31-1945    BSA:          1.785 m Patient Age:    56 years     BP:           179/99 mmHg Patient Gender: F            HR:           67 bpm. Exam Location:  Inpatient Procedure: 2D Echo, Cardiac Doppler and Color Doppler Indications:    CHF-Acute Diastolic 027.25  History:        Patient has prior history of Echocardiogram examinations, most                 recent 12/20/2018. Previous Myocardial Infarction, COPD, Aortic                 Valve Disease, Arrythmias:Atrial Fibrillation; Risk                 Factors:Hypertension, Diabetes, Dyslipidemia and Former Smoker.                 GERD.                 Aortic Valve: 23 mm Edwards bovine valve is present in the                 aortic position. Procedure Date: 04/12/2013.  Sonographer:    Vickie Epley RDCS Referring Phys: [place]  1. Left ventricular ejection fraction, by estimation, is 60 to 65%. The left ventricle has low normal function. The left ventricle has no regional wall motion abnormalities. There is mild left ventricular  hypertrophy. Left ventricular diastolic function  could not be evaluated. Left ventricular diastolic function could not be evaluated. Elevated left ventricular end-diastolic pressure.  2. Right ventricular systolic function is normal. The right ventricular size is normal. There is severely elevated  pulmonary artery systolic pressure. The estimated right ventricular systolic pressure is 25.4 mmHg.  3. Left atrial size was moderately dilated.  4. Right atrial size was mildly dilated.  5. The mitral valve is abnormal. Mild to moderate mitral valve regurgitation.  6. Tricuspid valve regurgitation is mild to moderate.  7. The aortic valve has been repaired/replaced. Aortic valve regurgitation is trivial. There is a 23 mm Edwards bovine valve present in the aortic position. Procedure Date: 04/12/2013. Aortic valve area, by VTI measures 1.66 cm. Aortic valve mean gradient measures 13.0 mmHg. Aortic valve Vmax measures 2.72 m/s.  8. The inferior vena cava is dilated in size with <50% respiratory variability, suggesting right atrial pressure of 15 mmHg. Comparison(s): Changes from prior study are noted. 12/20/2018: LVEF 60-65%, RVSP 44 mmHg. FINDINGS  Left Ventricle: Left ventricular ejection fraction, by estimation, is 60 to 65%. The left ventricle has low normal function. The left ventricle has no regional wall motion abnormalities. The left ventricular internal cavity size was normal in size. There is mild left ventricular hypertrophy. Left ventricular diastolic function could not be evaluated due to atrial fibrillation. Left ventricular diastolic function could not be evaluated. Elevated left ventricular end-diastolic pressure. Right Ventricle: The right ventricular size is normal. No increase in right ventricular wall thickness. Right ventricular systolic function is normal. There is severely elevated pulmonary artery systolic pressure. The tricuspid regurgitant velocity is 4.12 m/s, and with an assumed right atrial pressure of 15 mmHg, the estimated right ventricular systolic pressure is 27.0 mmHg. Left Atrium: Left atrial size was moderately dilated. Right Atrium: Right atrial size was mildly dilated. Pericardium: There is no evidence of pericardial effusion. Mitral Valve: The mitral valve is abnormal. There is  mild thickening of the mitral valve leaflet(s). There is mild calcification of the mitral valve leaflet(s). Moderate mitral annular calcification. Mild to moderate mitral valve regurgitation. Tricuspid Valve: The tricuspid valve is grossly normal. Tricuspid valve regurgitation is mild to moderate. Aortic Valve: The aortic valve has been repaired/replaced. Aortic valve regurgitation is trivial. Aortic valve mean gradient measures 13.0 mmHg. Aortic valve peak gradient measures 29.7 mmHg. Aortic valve area, by VTI measures 1.66 cm. There is a 23 mm Edwards bovine valve present in the aortic position. Procedure Date: 04/12/2013. Pulmonic Valve: The pulmonic valve was normal in structure. Pulmonic valve regurgitation is trivial. Aorta: The aortic root, ascending aorta, aortic arch and descending aorta are all structurally normal, with no evidence of dilitation or obstruction. Venous: The inferior vena cava is dilated in size with less than 50% respiratory variability, suggesting right atrial pressure of 15 mmHg. IAS/Shunts: No atrial level shunt detected by color flow Doppler.  LEFT VENTRICLE PLAX 2D LVIDd:         5.28 cm LVIDs:         4.19 cm LV PW:         1.03 cm LV IVS:        1.02 cm LVOT diam:     2.20 cm LV SV:         82 LV SV Index:   46 LVOT Area:     3.80 cm  LV Volumes (MOD) LV vol d, MOD A2C: 145.0 ml LV vol d, MOD A4C: 181.0 ml LV vol  s, MOD A2C: 75.6 ml LV vol s, MOD A4C: 83.7 ml LV SV MOD A2C:     69.4 ml LV SV MOD A4C:     181.0 ml LV SV MOD BP:      90.9 ml RIGHT VENTRICLE TAPSE (M-mode): 1.2 cm LEFT ATRIUM             Index       RIGHT ATRIUM           Index LA diam:        5.00 cm 2.80 cm/m  RA Area:     20.90 cm LA Vol (A2C):   63.6 ml 35.63 ml/m RA Volume:   66.00 ml  36.98 ml/m LA Vol (A4C):   79.0 ml 44.26 ml/m LA Biplane Vol: 71.2 ml 39.89 ml/m  AORTIC VALVE AV Area (Vmax):    1.41 cm AV Area (Vmean):   1.34 cm AV Area (VTI):     1.66 cm AV Vmax:           272.50 cm/s AV Vmean:           156.000 cm/s AV VTI:            0.490 m AV Peak Grad:      29.7 mmHg AV Mean Grad:      13.0 mmHg LVOT Vmax:         101.00 cm/s LVOT Vmean:        55.100 cm/s LVOT VTI:          0.215 m LVOT/AV VTI ratio: 0.44  AORTA Ao Root diam: 2.70 cm TRICUSPID VALVE TR Peak grad:   67.9 mmHg TR Vmax:        412.00 cm/s  SHUNTS Systemic VTI:  0.21 m Systemic Diam: 2.20 cm Lyman Bishop MD Electronically signed by Lyman Bishop MD Signature Date/Time: 10/02/2019/11:17:44 AM    Final      Admission status: The appropriate admission status for this patient is INPATIENT. Inpatient status is judged to be reasonable and necessary in order to provide the required intensity of service to ensure the patient's safety. The patient's presenting symptoms, physical exam findings, and initial radiographic and laboratory data in the context of their chronic comorbidities is felt to place them at high risk for further clinical deterioration. Furthermore, it is not anticipated that the patient will be medically stable for discharge from the hospital within 2 midnights of admission. The following factors support the admission status of inpatient.     The patient's presenting symptoms include.  Bilateral lower extremity edema, left upper extremity swelling The worrisome physical exam findings include bilateral lower extremity edema The initial radiographic and laboratory data are worrisome because of CHF exacerbation The chronic co-morbidities include CKD stage IIIb/IV       * I certify that at the point of admission it is my clinical judgment that the patient will require inpatient hospital care spanning beyond 2 midnights from the point of admission due to high intensity of service, high risk for further deterioration and high frequency of surveillance required.Oswald Hillock   [facility] If 7PM-7AM, please contact night-coverage at [email or web address], Office  [phone]   10/03/2019, 7:00 PM  LOS: 1 day

## 2019-10-03 NOTE — Progress Notes (Signed)
[facility] Progress Note  Name: [name] MRN: [id number] DOB: Aug 09, 1945  Chief Complaint:  Leg swelling  Subjective:  She had 2.1 liters UOP and one unmeasured void over 3/16.  States still feels pretty swollen and weak.  States left arm is always more swollen than right.    Review of systems:  Reports shortness of breath  Denies n/v Denies pain No dizziness or cramping   ------------------ Background on consult:  [name] is a 74 y.o. female with a history of CKD, prior ESRD status post renal transplant, hypertension, coronary artery disease and diabetes who presented to the hospital with lower extremity swelling.  She follows as an outpatient with Dr. Joelyn Oms in our office at [facility].  She states that she is weak and crawling up and down the steps at home.  She was instructed to start torsemide 20 mg daily at her last renal appt on 2/26 and her ACE inhibitor was stopped at that time due to worsening renal function.  She states she never picked up the torsemide.  She was also recently seen by cardiology and given torsemide x 3 days (she did pick this up) a couple of weeks ago and missed her follow-up with them due to weakness, fatigue.  She states has also been trying to drink a lot of water.  She is noted to recently have nephrotic range proteinuria.  Per her nephrologist she has has been evaluated by [facility] and transplant biopsy has been deferred; prior charting indicates deferred in setting of lung cancer.  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  Nephrology is consulted for assistance with management of CKD.  Creatinine trends as below.  Note that SARS coronavirus 2 testing was negative on 3/15.  Creatinine is 2.74 today compared with 2.92 on 3/15.  Her previous baseline creatinine was near 2.  She had 1.3 L of urine over 3/15 charted.  Note that she was started on Lasix 40 mg IV twice daily here.  Has just been on  spironolactone 12.5 mg daily at home - 25 mg daily ordered here.  She states "my kidneys aren't that bad" and states didn't know she had kidney failure.  Has been on 3 liters oxygen at home for about a year.  CHF RN is here and states that she may be discharged today to a program at her home where she would be monitored daily but could get IV diuretics - but they are awaiting PT eval.  Pt states crawls up the stairs.  They are unsure if she will be approved.  [facility] labs:  05/2019 - Cr 1.81, BUN 33 at [facility] 09/14/19 - Cr 2.80, BUN 59, eGFR 18 09/28/19 - Cr 2.95, BUN 75, eGFR 17, tacrolimus 3.0   Intake/Output Summary (Last 24 hours) at 10/03/2019 1334 Last data filed at 10/03/2019 0916 Gross per 24 hour  Intake 1032.88 ml  Output 1500 ml  Net -467.12 ml    Vitals:  Vitals:   10/03/19 0125 10/03/19 0405 10/03/19 0748 10/03/19 1107  BP: (!) 137/54 (!) 151/66 (!) 161/79 (!) 149/73  Pulse: (!) 59 (!) 53 63 67  Resp: (!) 22 19 16 20   Temp: 98.5 F (36.9 C) 97.8 F (36.6 C) 98.8 F (37.1 C)   TempSrc: Oral Oral Oral   SpO2: 97% 93% 99% 92%  Weight:  82.3 kg    Height:         Physical Exam:  General adult female  in bed in no acute distress HEENT normocephalic atraumatic extraocular movements intact sclera anicteric Neck supple trachea midline Lungs basilar crackles; unlabored at rest; 3 liters oxygen  Heart regular rate and rhythm no rubs or gallops appreciated Abdomen soft nontender nondistended; allograft nontender Extremities 2+ edema lower extremities and LUE  Psych normal mood and affect LUE AVF with thrill/bruit   Medications reviewed     Labs:  BMP Latest Ref Rng & Units 10/03/2019 10/02/2019 10/01/2019  Glucose 70 - 99 mg/dL 107(H) 164(H) 326(H)  BUN 8 - 23 mg/dL 64(H) 67(H) 69(H)  Creatinine 0.44 - 1.00 mg/dL 2.85(H) 2.74(H) 2.92(H)  BUN/Creat Ratio 12 - 28 - - -  Sodium 135 - 145 mmol/L 141 139 138  Potassium 3.5 - 5.1 mmol/L 3.3(L) 4.2 3.7  Chloride 98 - 111 mmol/L  101 103 101  CO2 22 - 32 mmol/L 27 24 23   Calcium 8.9 - 10.3 mg/dL 9.2 9.3 9.4     Assessment/Plan:   # Acute on chronic diastolic CHF  - Increase lasix to 60 mg IV BID for now and reassess on 3/18  - on supp oxygen  - Asked that she elevate her left arm and they are getting an extra pillow   # CKD stage IV   - 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; 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  - with progression  - got 40 meq potassium this afternoon - defer repeat dose for now and would reassess tomorrow's labs given her advanced CKD  - has old access in place LUE    # Renal transplant  - on immunosuppression as below  - s/p eval with [facility] per primary nephrologist and not felt to need transplant biopsy at that time and felt to have diabetic kidney disease; per outpatient charting hx nephrotic range proteinuria  - repeat UA   # Immunosuppression - on prednisone and prograf  - Has been on dapsone prophylaxis  # Hypertension   - With overload  - Would hold spironolactone for now - Getting lasix IV as above  # Chronic hypoxic resp failure - on supplemental oxygen   # Leukocyturia  - on ceftriaxone  - Note culture with insignificant growth   # Anemia - normocytic - mild at present    Claudia Desanctis, MD 10/03/2019 1:58 PM

## 2019-10-03 NOTE — Consult Note (Signed)
   [facility] Inpatient Consult   10/03/2019  [name] 1946-04-13 [id number]  [facility] Active Status:  Active  Alerted by [facility] of the patient's potential admission on 10/02/19. Patient in the Medicare [place] organization [ACO]. Patient is currently active with [[facility]] RN  for chronic disease management services of Diabetes.  Our community based plan of care has focused on disease management and community resource support.    Primary Care Provider: Plotnikov, [name], MD, [place],  this provider's office does the [facility] follow up.  Plan: Following for progress and dispositions. Will assign patient to appropriate discipline   Will make Inpatient Transition Of Care [TOC] team member to make aware that [place] Management following.   Of note, [facility] Care Management services does not replace or interfere with any services that are needed or arranged by inpatient [facility] care management team.  For additional questions or referrals please contact:  Natividad Brood, RN BSN [facility] Liaison  [phone] business mobile phone Toll free office [phone]  Fax number: [phone] [name]

## 2019-10-03 NOTE — Consult Note (Addendum)
Cardiology Consultation:   [name] MRN: [id number]; DOB: 1945/12/04  Admit date: 10/01/2019 Date of Consult: 10/03/2019  Primary Care Provider: [name], MD Primary Cardiologist: [name], MD  Primary Electrophysiologist:  None    Patient Profile:   [name] is a 74 y.o. female with a hx of severe AS s/p AVR with pericardial tissue valve and maze procedure in 2014, atrial fibrillation on Coumadin, embolic CVA, CAD status post PCI to RCA 2003, occluded by catheterization in 2014, normal nuclear stress test 11/2017, hypertension, chronic diastolic heart failure, hyperlipidemia, carotid Doppler September 2018 with 1 to 39% bilateral stenosis, ESRD status post renal transplant in 2012 AV fistula in place PVD followed by Dr. Fletcher Anon, lung tumor s/p radiation (declined resection),  who is being seen today for the evaluation of heart failure failure at the request of Dr. Darrick Meigs.  History of Present Illness:   [name] followed by Dr. [name] for the above cardiac issues.  Patient was admitted June 2020 for acute on chronic diastolic heart failure for 25 pound weight gain requiring IV diuresis.  Echo at that time showed EF 60 to 65%, mildly enlarged RV with moderate pulmonary hypertension, severe LAE, stable AV prosthesis, calcification of mitral valve.  The discharge was 170 pounds (dry weight around 160 pounds).  She was discharged on torsemide 100 mg twice daily.  Lisinopril was discontinued on discharge.  Potassium added in the setting of hypokalemia.  Outpatient follow-up patient was doing well.  She reported she was sent home on home oxygen. Lisinopril was restarted per Dr. [name].  She is not on aspirin due to need for Coumadin.  Patient was seen 09/19/2019 in the Coumadin clinic and noted to have dyspnea on exertion as well as lower leg edema.  She was sent to the office for further evaluation where she saw Marvis Moeller.  Reported Dr. Carmina Miller with  nephrology prescribed torsemide however she did not go to the pharmacy to fill the medication.  She reported weight gain of 17 pounds (174 from 157)lbs.  She was restarted on her torsemide 20 mg for 3 days and potassium supplements was scheduled to return in 1 week.  She felt to weak to get out of bed and was unable to go to the appointment.  Patient presented to the ED 10/01/2019 for worsening lower leg edema.  For the last 2 weeks she has had increasing lower leg edema and left arm edema despite taking torsemide. Also noted she had not been urinating as much as before.  Wears 3 L of oxygen at home but has not felt the need to increase it although does feel she has been more short of breath. She overall was feeling tired with no energy and unable to walk around her house.  No chest pain, fevers, chills, recent illness.  The ED blood pressure 136/102, pulse 90, afebrile, respiratory rate 31.  Edema noted on exam.  Labs showed glucose 326, creatinine 2.92 (? Baseline around 2, unable to see nephrologist records), BNP 2092.  Hemoglobin 11.5.  INR 2.0.  EKG showed rate controlled Afib, TWI aVL (not acute). Chest x-ray with no acute process.  Urine with leukocytes and WBC.  She was given IV Lasix 40, Rocephin for UTI and admitted.   Past Medical History:  Diagnosis Date  . Acute CHF (congestive heart failure) ([place]) 12/2018  . Adenocarcinoma of lung, stage 1, right ([place]) 11/25/2017  . Anemia, iron deficiency    of chronic disease  .  Aortic stenosis    a. Severe AS by echo 11/2012.  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 Asystole ([place])    a. During ENT surgery 2005: developed marked asystole requiring CPR, felt due to vagal reaction (cath nonobst dz).  . Carotid artery disease ([place])    a. Carotid Dopplers performed in August 2013 showed 40-59% left stenosis and 0-39% right; f/u recommended in 2 years.   . Cerebrovascular accident [facility]) 2009   a. LMCA infarct felt embolic 0240,  maintained on chronic coumadin.; denies residual on 04/05/2013  . Cholelithiasis   . Chronic Persistent Atrial Fibrillation 12/31/2008   Qualifier: Diagnosis of  By: [name]    . Coronary artery disease 05/2002   a. Ant MI 2003 s/p PTCA/stent to RCA.   . Diverticulosis of colon   . Esophagitis, reflux   . ESRD (end stage renal disease) ([place])    a. Mass on L kidney per pt s/p nephrectomy - pt states not cancer - [facility] notes indicate ESRD due to HTN/DM - was previously on HD. b. Kidney transplant 02/2011.  Marland Kitchen GERD (gastroesophageal reflux disease)   . Gout   . Hearing loss   . Helicobacter pylori (H. pylori) infection    hx of  . Hemorrhoids   . Hx of colonic polyps    adenomatous  . Hyperlipidemia   . Hypertension   . Lung nodule seen on imaging study 04/07/2013   1.0 cm ground glass opacity RUL  . Myocardial infarction ([place]) 2003  . Pericardial effusion    a. Small by echo 11/2011.  . S/P aortic valve replacement with bioprosthetic valve and maze procedure 04/12/2013   31mm [facility] Ease bovine pericardial tissue valve   . S/P Maze operation for atrial fibrillation 04/12/2013   Complete bilateral atrial lesion set using bipolar radiofrequency and cryothermy ablation with clipping of LA appendage  . Sleep apnea    Pt says testing was positive, intolerant of CPAP.  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  . Stroke ([place])   . Type II diabetes mellitus ([place])     Past Surgical History:  Procedure Laterality Date  . AORTIC VALVE REPLACEMENT N/A 04/12/2013   Procedure: AORTIC VALVE REPLACEMENT (AVR);  Surgeon: Rexene Alberts, MD;  Location: [place];  Service: Open Heart Surgery;  Laterality: N/A;  . ARTERIOVENOUS GRAFT PLACEMENT Left   . ARTERIOVENOUS GRAFT PLACEMENT Left    "I've had 2 on my left; had one removed"  (04/05/2013)   . ARTERY EXPLORATION Right 04/11/2013   Procedure: ARTERY EXPLORATION;  Surgeon: Rexene Alberts, MD;  Location: [place];  Service: Open Heart Surgery;  Laterality: Right;  Right carotid artery exploration  . AV FISTULA PLACEMENT Right   . AV FISTULA REPAIR Right    "took it out" ((/18/2014)  . CARDIOVERSION  05/29/2012   Procedure: CARDIOVERSION;  Surgeon: Lelon Perla, MD;  Location: [facility] ENDOSCOPY;  Service: Cardiovascular;  Laterality: N/A;  . CHOLECYSTECTOMY  2009   with hernia removal  . CORONARY ANGIOPLASTY WITH STENT PLACEMENT Right    coronary artery  . INSERTION OF DIALYSIS CATHETER Bilateral    "over the years; took them both out" (04/05/2013)  . INTRAOPERATIVE TRANSESOPHAGEAL ECHOCARDIOGRAM N/A 04/11/2013   Procedure: INTRAOPERATIVE TRANSESOPHAGEAL ECHOCARDIOGRAM;  Surgeon: Rexene Alberts, MD;  Location: [place];  Service: Open Heart Surgery;  Laterality: N/A;  . INTRAOPERATIVE TRANSESOPHAGEAL ECHOCARDIOGRAM N/A 04/12/2013   Procedure: INTRAOPERATIVE TRANSESOPHAGEAL ECHOCARDIOGRAM;  Surgeon: Rexene Alberts, MD;  Location: [place];  Service: Open Heart Surgery;  Laterality: N/A;  . KIDNEY TRANSPLANT  03/16/11  . LEFT AND RIGHT HEART CATHETERIZATION WITH CORONARY ANGIOGRAM N/A 04/06/2013   Procedure: LEFT AND RIGHT HEART CATHETERIZATION WITH CORONARY ANGIOGRAM;  Surgeon: Blane Ohara, MD;  Location[name] CATH LAB;  Service: Cardiovascular;  Laterality: N/A;  . MAZE N/A 04/12/2013   Procedure: MAZE;  Surgeon: Rexene Alberts, MD;  Location: [place];  Service: Open Heart Surgery;  Laterality: N/A;  . NASAL RECONSTRUCTION WITH SEPTAL REPAIR     "took it out" (04/05/2013)  . NEPHRECTOMY Left 2010   no CA on bx  . TONSILLECTOMY    . TOTAL ABDOMINAL HYSTERECTOMY    . TUBAL LIGATION       Home Medications:  Prior to Admission medications   Medication Sig Start Date End Date Taking? Authorizing Provider  acetaminophen (TYLENOL) 500 MG tablet Take 500 mg by mouth every 6 (six)  hours as needed for mild pain or headache.   Yes [provider]  amoxicillin (AMOXIL) 500 MG capsule TAKE 4 CAPSULES BY MOUTH 1 HOUR PRIOR TO DENTAL WORK Patient taking differently: Take 2,000 mg by mouth See admin instructions. TAKE 4 CAPSULES BY MOUTH 1 HOUR PRIOR TO DENTAL WORK 05/24/19  Yes Plotnikov, [name], MD  dapsone 25 MG tablet Take 25 mg by mouth daily.    Yes [provider]  HYDROcodone-acetaminophen (NORCO/VICODIN) 5-325 MG tablet Take 1 tablet by mouth every 6 (six) hours as needed for severe pain. 08/20/19 08/19/20 Yes Plotnikov, [name], MD  Insulin Glargine, 1 Unit Dial, (TOUJEO SOLOSTAR) 300 UNIT/ML SOPN Inject 24 Units into the skin every morning. Titrate up by 1 unit a day if needed for goal sugars of 100-130 up to 30 units a day max 03/14/19  Yes Plotnikov, [name], MD  levothyroxine (SYNTHROID) 25 MCG tablet TAKE 2 TABLETS (50 MCG TOTAL) BY MOUTH DAILY. Patient taking differently: Take 50 mcg by mouth daily before breakfast.  12/18/18  Yes Plotnikov, [name], MD  metoprolol tartrate (LOPRESSOR) 25 MG tablet Take 0.5 tablets (12.5 mg total) by mouth 2 (two) times daily. 08/22/19  Yes Sherran Needs, NP  potassium chloride SA (KLOR-CON M20) 20 MEQ tablet Take 1 tablet (20 mEq total) by mouth daily. 09/19/19 12/18/19 Yes Cleaver, Jossie Ng, NP  predniSONE (DELTASONE) 5 MG tablet Take 5 mg by mouth daily.  03/21/18  Yes [provider]  Propylene Glycol (SYSTANE BALANCE) 0.6 % SOLN Place 1-2 drops into both eyes 3 (three) times daily as needed (for dryness).   Yes [provider]  rosuvastatin (CRESTOR) 20 MG tablet Take 1 tablet (20 mg total) by mouth daily. Patient taking differently: Take 20 mg by mouth every other day.  03/21/19  Yes Lelon Perla, MD  spironolactone (ALDACTONE) 25 MG tablet Take 1 tablet (25 mg total) by mouth daily. 12/27/18  Yes Pokhrel, Laxman, MD  tacrolimus (PROGRAF) 1 MG capsule Take 3 mg by mouth 2 (two) times daily.    Yes  [provider]  warfarin (COUMADIN) 5 MG tablet Take 1/2 to 1 tablet by mouth daily as directed by coumadin clinic. Patient taking differently: Take 2.5-5 mg by mouth daily at 6 PM. 1/2 tablet on Mondays, 1 tablet all other days 05/22/19  Yes Naydeline [name], Denice Bors,  MD  Insulin Pen Needle (B-D UF III MINI PEN NEEDLES) 31G X 5 MM MISC USE TO ADMINISTER INSULIN FOUR TIMES A DAY DX E11.9 11/11/17   Plotnikov, [name], MD  [facility] DELICA LANCETS 56E MISC Use to check blood sugars three times a day DX E11.9 10/12/17   Plotnikov, [name], MD  ONETOUCH VERIO test strip USE TO TEST 3 TIMES DAILY. DX E11.9 Patient taking differently: 1 each by Other route 3 (three) times daily. DX: E11.9 10/20/18   Hoyt Koch, MD  OXYGEN Inhale 3 L into the lungs as needed (for shortness of breath).     [provider]    Inpatient Medications: Scheduled Meds: . dapsone  25 mg Oral Daily  . furosemide  40 mg Intravenous BID  . insulin aspart  0-15 Units Subcutaneous TID WC  . insulin aspart  0-5 Units Subcutaneous QHS  . insulin glargine  30 Units Subcutaneous Daily  . levothyroxine  50 mcg Oral QAC breakfast  . metoprolol tartrate  12.5 mg Oral BID  . potassium chloride  40 mEq Oral Daily  . predniSONE  5 mg Oral Q breakfast  . rosuvastatin  20 mg Oral QODAY  . sodium chloride flush  3 mL Intravenous Q12H  . tacrolimus  3 mg Oral BID  . [START ON 10/08/2019] warfarin  2.5 mg Oral Once per day on Mon  . warfarin  2.5 mg Oral ONCE-1800  . [START ON 10/04/2019] warfarin  5 mg Oral Once per day on Sun Tue Wed Thu Fri  . Warfarin - Pharmacist Dosing Inpatient   Does not apply q1800   Continuous Infusions: . sodium chloride Stopped (10/02/19 2324)  . cefTRIAXone (ROCEPHIN)  IV Stopped (10/02/19 2244)   PRN Meds: sodium chloride, acetaminophen, bisacodyl, HYDROcodone-acetaminophen, ondansetron (ZOFRAN) IV, polyethylene glycol, polyvinyl alcohol, sodium chloride flush  Allergies:      Allergies  Allergen Reactions  . Ibuprofen Nausea And Vomiting  . Sulfamethoxazole-Trimethoprim Itching, Swelling and Rash    Swelling of the face  . Sulfonamide Derivatives Itching, Swelling and Rash    Swelling of the face  . Tape Rash    Paper tape is ok  . Tramadol Nausea And Vomiting  . Doxycycline Nausea Only  . Hydrocil [Psyllium] Nausea And Vomiting  . Bactrim Itching, Swelling and Rash  . Red Dye Itching and Rash    Social History:   Social History   Socioeconomic History  . Marital status: Married    Spouse name: Not on file  . Number of children: 5  . Years of education: 76  . Highest education level: Not on file  Occupational History  . Occupation: retired    Fish farm manager: RETIRED  Tobacco Use  . Smoking status: Former Smoker    Packs/day: 1.00    Years: 30.00    Pack years: 30.00    Types: Cigarettes    Quit date: 07/19/2001    Years since quitting: 18.2  . Smokeless tobacco: Never Used  Substance and Sexual Activity  . Alcohol use: No  . Drug use: No  . Sexual activity: Not Currently  Other Topics Concern  . Not on file  Social History Narrative   Patient signed a Designated Party Release to allow her spouse [name] and family and five children to have access to her medical records/information.     Patient lives with husband in a 2 story home.  Has 5 children. Retired from Personal assistant.  Education: 3 years of college.  Social Determinants of Health   Financial Resource Strain:   . Difficulty of Paying Living Expenses:   Food Insecurity: No Food Insecurity  . Worried About Charity fundraiser in the Last Year: Never true  . Ran Out of Food in the Last Year: Never true  Transportation Needs: No Transportation Needs  . Lack of Transportation (Medical): No  . Lack of Transportation (Non-Medical): No  Physical Activity: Inactive  . Days of Exercise per Week: 0 days  . Minutes of Exercise per Session: 0 min  Stress: Stress Concern Present  . Feeling  of Stress : Rather much  Social Connections:   . Frequency of Communication with Friends and Family:   . Frequency of Social Gatherings with Friends and Family:   . Attends Religious Services:   . Active Member of Clubs or Organizations:   . Attends Archivist Meetings:   Marland Kitchen Marital Status:   Intimate Partner Violence: Not At Risk  . Fear of Current or Ex-Partner: No  . Emotionally Abused: No  . Physically Abused: No  . Sexually Abused: No    Family History:   Family History  Problem Relation Age of Onset  . Stroke Father   . Hypertension Mother   . Diabetes Other   . Diabetes Maternal Grandmother   . Diabetes Son   . Crohn's disease Other        grandson   . Breast cancer Neg Hx   . Stomach cancer Neg Hx   . Esophageal cancer Neg Hx   . Colon cancer Neg Hx   . Pancreatic cancer Neg Hx      ROS:  Please see the history of present illness.  All other ROS reviewed and negative.     Physical Exam/Data:   Vitals:   10/03/19 0125 10/03/19 0405 10/03/19 0748 10/03/19 1107  BP: (!) 137/54 (!) 151/66 (!) 161/79 (!) 149/73  Pulse: (!) 59 (!) 53 63 67  Resp: (!) 22 19 16 20   Temp: 98.5 F (36.9 C) 97.8 F (36.6 C) 98.8 F (37.1 C)   TempSrc: Oral Oral Oral   SpO2: 97% 93% 99% 92%  Weight:  82.3 kg    Height:        Intake/Output Summary (Last 24 hours) at 10/03/2019 1249 Last data filed at 10/03/2019 0916 Gross per 24 hour  Intake 1032.88 ml  Output 1500 ml  Net -467.12 ml   Last 3 Weights 10/03/2019 10/02/2019 10/01/2019  Weight (lbs) 181 lb 6.4 oz 179 lb 0.2 oz 180 lb  Weight (kg) 82.283 kg 81.2 kg 81.647 kg     Body mass index is 35.43 kg/m.  General:  Well nourished, well developed, in no acute distress HEENT: normal Lymph: no adenopathy Neck: + JVD Endocrine:  No thryomegaly Vascular: No carotid bruits; difficult to palpate pedal pulse Cardiac:  normal S1, S2; Irreg Irreg; + murmur  Lungs:  clear to auscultation bilaterally, no wheezing, rhonchi  or rales; 3L O2  Abd: soft, nontender, no hepatomegaly  Ext: 2+ edema, r>L, right arm edema Musculoskeletal:  No deformities, BUE and BLE strength normal and equal Skin: warm and dry  Neuro:  CNs 2-12 intact, no focal abnormalities noted Psych:  Normal affect   EKG:  The EKG was personally reviewed and demonstrates:  Afib 75 bpm, TW IaVL Telemetry:  Telemetry was personally reviewed and demonstrates:  Afib, HR 60-70s  Relevant CV Studies:  Echo 10/02/2019 1. Left ventricular ejection fraction, by estimation, is 60  to 65%. The  left ventricle has low normal function. The left ventricle has no regional  wall motion abnormalities. There is mild left ventricular hypertrophy.  Left ventricular diastolic function  could not be evaluated. Left ventricular diastolic function could not be  evaluated. Elevated left ventricular end-diastolic pressure.  2. Right ventricular systolic function is normal. The right ventricular  size is normal. There is severely elevated pulmonary artery systolic  pressure. The estimated right ventricular systolic pressure is 24.4 mmHg.  3. Left atrial size was moderately dilated.  4. Right atrial size was mildly dilated.  5. The mitral valve is abnormal. Mild to moderate mitral valve  regurgitation.  6. Tricuspid valve regurgitation is mild to moderate.  7. The aortic valve has been repaired/replaced. Aortic valve  regurgitation is trivial. There is a 23 mm Edwards bovine valve present in  the aortic position. Procedure Date: 04/12/2013. Aortic valve area, by VTI  measures 1.66 cm. Aortic valve mean  gradient measures 13.0 mmHg. Aortic valve Vmax measures 2.72 m/s.  8. The inferior vena cava is dilated in size with <50% respiratory  variability, suggesting right atrial pressure of 15 mmHg.   Myoview stress test 11/2017  Nuclear stress EF: 59%.  No T wave inversion was noted during stress.  There was no ST segment deviation noted during  stress.  This is a low risk study.   No reversible perfusion defects. LVEF 59% with normal wall motion. This is a low risk study.  Laboratory Data:  High Sensitivity Troponin:  No results for input(s): TROPONINIHS in the last 720 hours.   Chemistry Recent Labs  Lab 10/01/19 1724 10/02/19 0301 10/03/19 0635  NA 138 139 141  K 3.7 4.2 3.3*  CL 101 103 101  CO2 23 24 27   GLUCOSE 326* 164* 107*  BUN 69* 67* 64*  CREATININE 2.92* 2.74* 2.85*  CALCIUM 9.4 9.3 9.2  GFRNONAA 15* 16* 16*  GFRAA 17* 19* 18*  ANIONGAP 14 12 13     Recent Labs  Lab 10/03/19 0635  ALBUMIN 2.4*   Hematology Recent Labs  Lab 10/01/19 1724 10/03/19 0609  WBC 6.6 7.8  RBC 4.57 4.31  HGB 11.5* 10.9*  HCT 39.8 37.1  MCV 87.1 86.1  MCH 25.2* 25.3*  MCHC 28.9* 29.4*  RDW 20.7* 20.2*  PLT 237 249   BNP Recent Labs  Lab 10/01/19 1724  BNP 2,092.5*    DDimer No results for input(s): DDIMER in the last 168 hours.   Radiology/Studies:  DG Chest Portable 1 View  Result Date: 10/01/2019 CLINICAL DATA:  74 year old female with shortness of breath. EXAM: PORTABLE CHEST 1 VIEW COMPARISON:  Chest radiograph dated 12/22/2018. FINDINGS: There is stable cardiomegaly. Left atrial appendage occlusive device, median sternotomy wires, and mechanical aortic valve noted. No significant congestion. No focal consolidation, pleural effusion, pneumothorax. Linear and streaky density in the right upper lobe similar to prior CT and radiograph of 12/22/2018 likely represents scarring or post radiation changes. There is atherosclerotic calcification of the aorta. No acute osseous pathology. IMPRESSION: 1. No acute cardiopulmonary process. 2. Stable cardiomegaly. Electronically Signed   By: Anner Crete M.D.   On: 10/01/2019 20:21   ECHOCARDIOGRAM COMPLETE  Result Date: 10/02/2019    ECHOCARDIOGRAM REPORT   Patient Name:   [name] Date of Exam: 10/02/2019 Medical Rec #:  [id number]    Height:       60.0 in  Accession #:    [id number]   Weight:  180.0 lb Date of Birth:  21-May-1946    BSA:          1.785 m Patient Age:    74 years     BP:           179/99 mmHg Patient Gender: F            HR:           67 bpm. Exam Location:  Inpatient Procedure: 2D Echo, Cardiac Doppler and Color Doppler Indications:    CHF-Acute Diastolic 948.54  History:        Patient has prior history of Echocardiogram examinations, most                 recent 12/20/2018. Previous Myocardial Infarction, COPD, Aortic                 Valve Disease, Arrythmias:Atrial Fibrillation; Risk                 Factors:Hypertension, Diabetes, Dyslipidemia and Former Smoker.                 GERD.                 Aortic Valve: 23 mm Edwards bovine valve is present in the                 aortic position. Procedure Date: 04/12/2013.  Sonographer:    Vickie Epley RDCS Referring Phys: [place]  1. Left ventricular ejection fraction, by estimation, is 60 to 65%. The left ventricle has low normal function. The left ventricle has no regional wall motion abnormalities. There is mild left ventricular hypertrophy. Left ventricular diastolic function  could not be evaluated. Left ventricular diastolic function could not be evaluated. Elevated left ventricular end-diastolic pressure.  2. Right ventricular systolic function is normal. The right ventricular size is normal. There is severely elevated pulmonary artery systolic pressure. The estimated right ventricular systolic pressure is 62.7 mmHg.  3. Left atrial size was moderately dilated.  4. Right atrial size was mildly dilated.  5. The mitral valve is abnormal. Mild to moderate mitral valve regurgitation.  6. Tricuspid valve regurgitation is mild to moderate.  7. The aortic valve has been repaired/replaced. Aortic valve regurgitation is trivial. There is a 23 mm Edwards bovine valve present in the aortic position. Procedure Date: 04/12/2013. Aortic valve area, by VTI measures 1.66 cm. Aortic  valve mean gradient measures 13.0 mmHg. Aortic valve Vmax measures 2.72 m/s.  8. The inferior vena cava is dilated in size with <50% respiratory variability, suggesting right atrial pressure of 15 mmHg. Comparison(s): Changes from prior study are noted. 12/20/2018: LVEF 60-65%, RVSP 44 mmHg. FINDINGS  Left Ventricle: Left ventricular ejection fraction, by estimation, is 60 to 65%. The left ventricle has low normal function. The left ventricle has no regional wall motion abnormalities. The left ventricular internal cavity size was normal in size. There is mild left ventricular hypertrophy. Left ventricular diastolic function could not be evaluated due to atrial fibrillation. Left ventricular diastolic function could not be evaluated. Elevated left ventricular end-diastolic pressure. Right Ventricle: The right ventricular size is normal. No increase in right ventricular wall thickness. Right ventricular systolic function is normal. There is severely elevated pulmonary artery systolic pressure. The tricuspid regurgitant velocity is 4.12 m/s, and with an assumed right atrial pressure of 15 mmHg, the estimated right ventricular systolic pressure is 03.5 mmHg. Left Atrium: Left atrial size was moderately dilated. Right  Atrium: Right atrial size was mildly dilated. Pericardium: There is no evidence of pericardial effusion. Mitral Valve: The mitral valve is abnormal. There is mild thickening of the mitral valve leaflet(s). There is mild calcification of the mitral valve leaflet(s). Moderate mitral annular calcification. Mild to moderate mitral valve regurgitation. Tricuspid Valve: The tricuspid valve is grossly normal. Tricuspid valve regurgitation is mild to moderate. Aortic Valve: The aortic valve has been repaired/replaced. Aortic valve regurgitation is trivial. Aortic valve mean gradient measures 13.0 mmHg. Aortic valve peak gradient measures 29.7 mmHg. Aortic valve area, by VTI measures 1.66 cm. There is a 23 mm Edwards  bovine valve present in the aortic position. Procedure Date: 04/12/2013. Pulmonic Valve: The pulmonic valve was normal in structure. Pulmonic valve regurgitation is trivial. Aorta: The aortic root, ascending aorta, aortic arch and descending aorta are all structurally normal, with no evidence of dilitation or obstruction. Venous: The inferior vena cava is dilated in size with less than 50% respiratory variability, suggesting right atrial pressure of 15 mmHg. IAS/Shunts: No atrial level shunt detected by color flow Doppler.  LEFT VENTRICLE PLAX 2D LVIDd:         5.28 cm LVIDs:         4.19 cm LV PW:         1.03 cm LV IVS:        1.02 cm LVOT diam:     2.20 cm LV SV:         82 LV SV Index:   46 LVOT Area:     3.80 cm  LV Volumes (MOD) LV vol d, MOD A2C: 145.0 ml LV vol d, MOD A4C: 181.0 ml LV vol s, MOD A2C: 75.6 ml LV vol s, MOD A4C: 83.7 ml LV SV MOD A2C:     69.4 ml LV SV MOD A4C:     181.0 ml LV SV MOD BP:      90.9 ml RIGHT VENTRICLE TAPSE (M-mode): 1.2 cm LEFT ATRIUM             Index       RIGHT ATRIUM           Index LA diam:        5.00 cm 2.80 cm/m  RA Area:     20.90 cm LA Vol (A2C):   63.6 ml 35.63 ml/m RA Volume:   66.00 ml  36.98 ml/m LA Vol (A4C):   79.0 ml 44.26 ml/m LA Biplane Vol: 71.2 ml 39.89 ml/m  AORTIC VALVE AV Area (Vmax):    1.41 cm AV Area (Vmean):   1.34 cm AV Area (VTI):     1.66 cm AV Vmax:           272.50 cm/s AV Vmean:          156.000 cm/s AV VTI:            0.490 m AV Peak Grad:      29.7 mmHg AV Mean Grad:      13.0 mmHg LVOT Vmax:         101.00 cm/s LVOT Vmean:        55.100 cm/s LVOT VTI:          0.215 m LVOT/AV VTI ratio: 0.44  AORTA Ao Root diam: 2.70 cm TRICUSPID VALVE TR Peak grad:   67.9 mmHg TR Vmax:        412.00 cm/s  SHUNTS Systemic VTI:  0.21 m Systemic Diam: 2.20 cm Lyman Bishop MD Electronically signed by  Lyman Bishop MD Signature Date/Time: 10/02/2019/11:17:44 AM    Final    {  Assessment and Plan:   Acute on chronic diastolic heart  failure Presented with increasing lower leg edema and 20 lb weight gain. 2 weeks ago patient was given 3 days of Torsemide in outpatient setting but was too fatigued to follow up. On admission CXR negative and BNP 2,092. - Started on IV Lasix 40 twice daily - Echo this admission with EF 60-65%, no WMA, mild LVH, elevated LVEDP - Patient put out 2.1 L urine overnight, -2.3 since admission - Dry weight reportedly around 160 lbs. Weight today 180lbs.  - creatinine 2.92> 2.74 > 2.85. Nephrology consulted - continue BB. Not on ACE/ARB 2/2 to kidney function - Arlyce Harman held - Patient is fluid overloaded on exam. She has swelling in her legs, R>L and right arm and breasts (which the patient says is normal for her) - would continue with diuresis, consider increase lasix dose.   UTI -ABX per IM -Urine culture ordered  CKD stage IV/s/p renal transplant - creatinine appears to be worsening - nephrology following - Lasix increased  History of lung cancer/respiratory failure -3 L of O2 at baseline. Remains on 3L O2 -Followed by oncology and primary care  Paroxysmal A. Fib -Coumadin per pharmacy -Continue beta-blocker  Hypertension - On Lopressor 12.5 mgBID. spironolactone held - pressures minimally elevated. 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  Briefly she is a 74 year old female with past medical history of aortic stenosis status post aortic valve replacement, atrial fibrillation,  prior embolic CVA, coronary artery disease, hypertension, chronic diastolic congestive heart failure, hyperlipidemia, prior renal transplant, lung cancer for evaluation of acute on chronic diastolic congestive heart failure.  Echocardiogram this admission shows normal LV function, mild left ventricular hypertrophy, severe pulmonary hypertension, biatrial enlargement, mild to moderate mitral and tricuspid regurgitation, prior aortic valve replacement with mean gradient 13 mmHg and trace aortic insufficiency.  Patient has been seen recently with worsening edema and weight gain.  Diuretics were prescribed.  However her symptoms progressed and she was therefore admitted.  She states that for the past 2 months she has had increasing dyspnea on exertion but denies orthopnea.  She has had worsening bilateral lower extremity edema as well as left upper extremity edema.  She has gained 20 pounds in the past 2 weeks.  No chest pain.  Laboratories at time of admission show creatinine 2.92 and BUN 69.  BNP 2092.  Electrocardiogram shows sinus rhythm with PACs and lateral T wave inversion.  1 acute on chronic diastolic congestive heart failure-patient significantly volume overloaded on examination.  Increase Lasix to 60 mg twice daily and follow renal function closely.  LV function is normal on echocardiogram and aortic valve is functioning well.  Needs fluid restriction and low-sodium diet.  2 acute on chronic stage IV kidney disease/prior renal transplant-follow renal function closely with diuresis particularly in light of prior transplant.  Nephrology also following.  3 prior aortic  valve replacement-functioning normally on echocardiogram this admission.  4 paroxysmal atrial fibrillation-patient is in sinus rhythm.  Continue metoprolol and Coumadin.  5 history of coronary artery disease-no chest pain.  Continue statin.  No aspirin given need for Coumadin.  [name], MD

## 2019-10-04 ENCOUNTER — Ambulatory Visit: Payer: Medicare Other | Admitting: Internal Medicine

## 2019-10-04 ENCOUNTER — Inpatient Hospital Stay (HOSPITAL_COMMUNITY): Payer: Medicare Other

## 2019-10-04 DIAGNOSIS — Z0289 Encounter for other administrative examinations: Secondary | ICD-10-CM

## 2019-10-04 DIAGNOSIS — M7989 Other specified soft tissue disorders: Secondary | ICD-10-CM

## 2019-10-04 LAB — BASIC METABOLIC PANEL
Anion gap: 12 (ref 5–15)
BUN: 68 mg/dL — ABNORMAL HIGH (ref 8–23)
CO2: 26 mmol/L (ref 22–32)
Calcium: 9.4 mg/dL (ref 8.9–10.3)
Chloride: 103 mmol/L (ref 98–111)
Creatinine, Ser: 3.08 mg/dL — ABNORMAL HIGH (ref 0.44–1.00)
GFR calc Af Amer: 16 mL/min — ABNORMAL LOW (ref >60)
GFR calc non Af Amer: 14 mL/min — ABNORMAL LOW (ref >60)
Glucose, Bld: 124 mg/dL — ABNORMAL HIGH (ref 70–99)
Potassium: 4.5 mmol/L (ref 3.5–5.1)
Sodium: 141 mmol/L (ref 135–145)

## 2019-10-04 LAB — GLUCOSE, CAPILLARY
Glucose-Capillary: 110 mg/dL — ABNORMAL HIGH (ref 70–99)
Glucose-Capillary: 132 mg/dL — ABNORMAL HIGH (ref 70–99)
Glucose-Capillary: 256 mg/dL — ABNORMAL HIGH (ref 70–99)
Glucose-Capillary: 200 mg/dL — ABNORMAL HIGH (ref 70–99)

## 2019-10-04 LAB — PROTIME-INR
INR: 2.5 — ABNORMAL HIGH (ref 0.8–1.2)
Prothrombin Time: 26.9 seconds — ABNORMAL HIGH (ref 11.4–15.2)

## 2019-10-04 MED ORDER — FUROSEMIDE 10 MG/ML IJ SOLN
80.0000 mg | Freq: Two times a day (BID) | INTRAMUSCULAR | Status: DC
  Administered 2019-10-04: 80 mg via INTRAVENOUS

## 2019-10-04 MED ORDER — CHLORHEXIDINE GLUCONATE CLOTH 2 % EX PADS
6.0000 | MEDICATED_PAD | Freq: Every day | CUTANEOUS | Status: DC
  Administered 2019-10-05 – 2019-10-11 (×4): 6 via TOPICAL

## 2019-10-04 NOTE — Progress Notes (Signed)
[facility] Progress Note  Name: [name] MRN: [id number] DOB: 03/02/1946  Chief Complaint:  Leg swelling  Subjective:  Having UOP but weights not down.  Cr up to 3.0 today  Review of systems:  Reports shortness of breath  Denies n/v Denies pain No dizziness or cramping   ------------------ Background on consult:  Faten Frieson is a 74 y.o. female with a history of CKD, prior ESRD status post renal transplant, hypertension, coronary artery disease and diabetes who presented to the hospital with lower extremity swelling.  She follows as an outpatient with Dr. Joelyn Oms in our office at [facility].  She states that she is weak and crawling up and down the steps at home.  She was instructed to start torsemide 20 mg daily at her last renal appt on 2/26 and her ACE inhibitor was stopped at that time due to worsening renal function.  She states she never picked up the torsemide.  She was also recently seen by cardiology and given torsemide x 3 days (she did pick this up) a couple of weeks ago and missed her follow-up with them due to weakness, fatigue.  She states has also been trying to drink a lot of water.  She is noted to recently have nephrotic range proteinuria.  Per her nephrologist she has has been evaluated by [facility] and transplant biopsy has been deferred; prior charting indicates deferred in setting of lung cancer.  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  Nephrology is consulted for assistance with management of CKD.  Creatinine trends as below.  Note that SARS coronavirus 2 testing was negative on 3/15.  Creatinine is 2.74 today compared with 2.92 on 3/15.  Her previous baseline creatinine was near 2.  She had 1.3 L of urine over 3/15 charted.  Note that she was started on Lasix 40 mg IV twice daily here.  Has just been on spironolactone 12.5 mg daily at home - 25 mg daily ordered here.  She states "my kidneys aren't that bad" and  states didn't know she had kidney failure.  Has been on 3 liters oxygen at home for about a year.  [facility] RN is here and states that she may be discharged today to a program at her home where she would be monitored daily but could get IV diuretics - but they are awaiting PT eval.  Pt states crawls up the stairs.  They are unsure if she will be approved.  [facility] labs:  05/2019 - Cr 1.81, BUN 33 at [facility] 09/14/19 - Cr 2.80, BUN 59, eGFR 18 09/28/19 - Cr 2.95, BUN 75, eGFR 17, tacrolimus 3.0   Intake/Output Summary (Last 24 hours) at 10/04/2019 1239 Last data filed at 10/04/2019 0904 Gross per 24 hour  Intake 820 ml  Output 400 ml  Net 420 ml    Vitals:  Vitals:   10/04/19 0043 10/04/19 0427 10/04/19 0905 10/04/19 1211  BP:  (!) 174/90 140/68 (!) 158/66  Pulse:  (!) 109 88 (!) 56  Resp:  19 16 18   Temp:  98.4 F (36.9 C)  98.1 F (36.7 C)  TempSrc:  Oral  Oral  SpO2:  100% 94% 98%  Weight: 82.6 kg     Height:         Physical Exam:  General adult female in bed in no acute distress HEENT normocephalic atraumatic extraocular movements intact sclera anicteric Neck supple trachea midline Lungs basilar crackles; unlabored at rest; 3  liters oxygen  Heart regular rate and rhythm no rubs or gallops appreciated Abdomen soft nontender nondistended; allograft nontender Extremities 2+ edema lower extremities and LUE  Psych normal mood and affect LUE AVF with thrill/bruit   Medications reviewed     Labs:  BMP Latest Ref Rng & Units 10/04/2019 10/03/2019 10/02/2019  Glucose 70 - 99 mg/dL 124(H) 107(H) 164(H)  BUN 8 - 23 mg/dL 68(H) 64(H) 67(H)  Creatinine 0.44 - 1.00 mg/dL 3.08(H) 2.85(H) 2.74(H)  BUN/Creat Ratio 12 - 28 - - -  Sodium 135 - 145 mmol/L 141 141 139  Potassium 3.5 - 5.1 mmol/L 4.5 3.3(L) 4.2  Chloride 98 - 111 mmol/L 103 101 103  CO2 22 - 32 mmol/L 26 27 24   Calcium 8.9 - 10.3 mg/dL 9.4 9.2 9.3     Assessment/Plan:   # Acute on chronic diastolic CHF  - stop Lasix,  switch to torsemide tomorrow per pt request - on supp oxygen  - Asked that she elevate her left arm and they are getting an extra pillow   # CKD stage IV   - 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; 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  - with progression  - has old access in place LUE-- getting duplex d/t her arm swelling  # Renal transplant  - on immunosuppression as below  - s/p eval with [facility] per primary nephrologist and not felt to need transplant biopsy at that time and felt to have diabetic kidney disease; per outpatient charting hx nephrotic range proteinuria  - repeat UA   # Immunosuppression - on prednisone and prograf  - Has been on dapsone prophylaxis  # Hypertension   - With overload  - diuretics as above  # Chronic hypoxic resp failure - on supplemental oxygen   # Leukocyturia  - on ceftriaxone  - Note culture with insignificant growth   # Anemia - normocytic - mild at present    Madelon Lips, MD 10/04/2019 12:39 PM

## 2019-10-04 NOTE — Progress Notes (Signed)
Progress Note  Patient Name: [name] Date of Encounter: 10/04/2019  Primary Cardiologist: [name], MD   Subjective   No CP; DOE but improving  Inpatient Medications    Scheduled Meds: . dapsone  25 mg Oral Daily  . furosemide  60 mg Intravenous BID  . insulin aspart  0-15 Units Subcutaneous TID WC  . insulin aspart  0-5 Units Subcutaneous QHS  . insulin glargine  30 Units Subcutaneous Daily  . levothyroxine  50 mcg Oral QAC breakfast  . metoprolol tartrate  12.5 mg Oral BID  . predniSONE  5 mg Oral Q breakfast  . rosuvastatin  20 mg Oral QODAY  . sodium chloride flush  3 mL Intravenous Q12H  . tacrolimus  3 mg Oral BID  . [START ON 10/08/2019] warfarin  2.5 mg Oral Once per day on Mon  . warfarin  5 mg Oral Once per day on Sun Tue Wed Thu Fri  . Warfarin - Pharmacist Dosing Inpatient   Does not apply q1800   Continuous Infusions: . sodium chloride Stopped (10/02/19 2324)   PRN Meds: sodium chloride, acetaminophen, bisacodyl, HYDROcodone-acetaminophen, ondansetron (ZOFRAN) IV, polyethylene glycol, polyvinyl alcohol, sodium chloride flush   Vital Signs    Vitals:   10/03/19 1746 10/03/19 2020 10/04/19 0043 10/04/19 0427  BP: (!) 161/86 (!) 148/66  (!) 174/90  Pulse: 70 61  (!) 109  Resp: 18 19  19   Temp: 98.2 F (36.8 C) 97.9 F (36.6 C)  98.4 F (36.9 C)  TempSrc: Oral Oral  Oral  SpO2: 93% 97%  100%  Weight:   82.6 kg   Height:        Intake/Output Summary (Last 24 hours) at 10/04/2019 0737 Last data filed at 10/04/2019 0043 Gross per 24 hour  Intake 586 ml  Output 600 ml  Net -14 ml   Last 3 Weights 10/04/2019 10/03/2019 10/02/2019  Weight (lbs) 182 lb 1.6 oz 181 lb 6.4 oz 179 lb 0.2 oz  Weight (kg) 82.6 kg 82.283 kg 81.2 kg      Telemetry    Sinus- Personally Reviewed   Physical Exam   GEN: No acute distress.   Neck: Positive JVD Cardiac: RRR Respiratory: Clear to auscultation bilaterally. GI: Soft, nontender, non-distended  MS:  2+ edema lower ext and LUE Neuro:  Nonfocal  Psych: Normal affect   Labs     Chemistry Recent Labs  Lab 10/02/19 0301 10/03/19 0635 10/04/19 0514  NA 139 141 141  K 4.2 3.3* 4.5  CL 103 101 103  CO2 24 27 26   GLUCOSE 164* 107* 124*  BUN 67* 64* 68*  CREATININE 2.74* 2.85* 3.08*  CALCIUM 9.3 9.2 9.4  ALBUMIN  --  2.4*  --   GFRNONAA 16* 16* 14*  GFRAA 19* 18* 16*  ANIONGAP 12 13 12      Hematology Recent Labs  Lab 10/01/19 1724 10/03/19 0609  WBC 6.6 7.8  RBC 4.57 4.31  HGB 11.5* 10.9*  HCT 39.8 37.1  MCV 87.1 86.1  MCH 25.2* 25.3*  MCHC 28.9* 29.4*  RDW 20.7* 20.2*  PLT 237 249    BNP Recent Labs  Lab 10/01/19 1724  BNP 2,092.5*     Radiology    ECHOCARDIOGRAM COMPLETE  Result Date: 10/02/2019    ECHOCARDIOGRAM REPORT   Patient Name:   [name] Date of Exam: 10/02/2019 Medical Rec #:  [id number]    Height:       60.0 in Accession #:  [id number]   Weight:       180.0 lb Date of Birth:  1945-08-12    BSA:          1.785 m Patient Age:    74 years     BP:           179/99 mmHg Patient Gender: F            HR:           67 bpm. Exam Location:  Inpatient Procedure: 2D Echo, Cardiac Doppler and Color Doppler Indications:    CHF-Acute Diastolic 093.26  History:        Patient has prior history of Echocardiogram examinations, most                 recent 12/20/2018. Previous Myocardial Infarction, COPD, Aortic                 Valve Disease, Arrythmias:Atrial Fibrillation; Risk                 Factors:Hypertension, Diabetes, Dyslipidemia and Former Smoker.                 GERD.                 Aortic Valve: 23 mm Edwards bovine valve is present in the                 aortic position. Procedure Date: 04/12/2013.  Sonographer:    Vickie Epley RDCS Referring Phys: [place]  1. Left ventricular ejection fraction, by estimation, is 60 to 65%. The left ventricle has low normal function. The left ventricle has no regional wall motion abnormalities. There  is mild left ventricular hypertrophy. Left ventricular diastolic function  could not be evaluated. Left ventricular diastolic function could not be evaluated. Elevated left ventricular end-diastolic pressure.  2. Right ventricular systolic function is normal. The right ventricular size is normal. There is severely elevated pulmonary artery systolic pressure. The estimated right ventricular systolic pressure is 71.2 mmHg.  3. Left atrial size was moderately dilated.  4. Right atrial size was mildly dilated.  5. The mitral valve is abnormal. Mild to moderate mitral valve regurgitation.  6. Tricuspid valve regurgitation is mild to moderate.  7. The aortic valve has been repaired/replaced. Aortic valve regurgitation is trivial. There is a 23 mm Edwards bovine valve present in the aortic position. Procedure Date: 04/12/2013. Aortic valve area, by VTI measures 1.66 cm. Aortic valve mean gradient measures 13.0 mmHg. Aortic valve Vmax measures 2.72 m/s.  8. The inferior vena cava is dilated in size with <50% respiratory variability, suggesting right atrial pressure of 15 mmHg. Comparison(s): Changes from prior study are noted. 12/20/2018: LVEF 60-65%, RVSP 44 mmHg. FINDINGS  Left Ventricle: Left ventricular ejection fraction, by estimation, is 60 to 65%. The left ventricle has low normal function. The left ventricle has no regional wall motion abnormalities. The left ventricular internal cavity size was normal in size. There is mild left ventricular hypertrophy. Left ventricular diastolic function could not be evaluated due to atrial fibrillation. Left ventricular diastolic function could not be evaluated. Elevated left ventricular end-diastolic pressure. Right Ventricle: The right ventricular size is normal. No increase in right ventricular wall thickness. Right ventricular systolic function is normal. There is severely elevated pulmonary artery systolic pressure. The tricuspid regurgitant velocity is 4.12 m/s, and with an  assumed right atrial pressure of 15 mmHg, the estimated right ventricular systolic pressure is  82.9 mmHg. Left Atrium: Left atrial size was moderately dilated. Right Atrium: Right atrial size was mildly dilated. Pericardium: There is no evidence of pericardial effusion. Mitral Valve: The mitral valve is abnormal. There is mild thickening of the mitral valve leaflet(s). There is mild calcification of the mitral valve leaflet(s). Moderate mitral annular calcification. Mild to moderate mitral valve regurgitation. Tricuspid Valve: The tricuspid valve is grossly normal. Tricuspid valve regurgitation is mild to moderate. Aortic Valve: The aortic valve has been repaired/replaced. Aortic valve regurgitation is trivial. Aortic valve mean gradient measures 13.0 mmHg. Aortic valve peak gradient measures 29.7 mmHg. Aortic valve area, by VTI measures 1.66 cm. There is a 23 mm Edwards bovine valve present in the aortic position. Procedure Date: 04/12/2013. Pulmonic Valve: The pulmonic valve was normal in structure. Pulmonic valve regurgitation is trivial. Aorta: The aortic root, ascending aorta, aortic arch and descending aorta are all structurally normal, with no evidence of dilitation or obstruction. Venous: The inferior vena cava is dilated in size with less than 50% respiratory variability, suggesting right atrial pressure of 15 mmHg. IAS/Shunts: No atrial level shunt detected by color flow Doppler.  LEFT VENTRICLE PLAX 2D LVIDd:         5.28 cm LVIDs:         4.19 cm LV PW:         1.03 cm LV IVS:        1.02 cm LVOT diam:     2.20 cm LV SV:         82 LV SV Index:   46 LVOT Area:     3.80 cm  LV Volumes (MOD) LV vol d, MOD A2C: 145.0 ml LV vol d, MOD A4C: 181.0 ml LV vol s, MOD A2C: 75.6 ml LV vol s, MOD A4C: 83.7 ml LV SV MOD A2C:     69.4 ml LV SV MOD A4C:     181.0 ml LV SV MOD BP:      90.9 ml RIGHT VENTRICLE TAPSE (M-mode): 1.2 cm LEFT ATRIUM             Index       RIGHT ATRIUM           Index LA diam:        5.00  cm 2.80 cm/m  RA Area:     20.90 cm LA Vol (A2C):   63.6 ml 35.63 ml/m RA Volume:   66.00 ml  36.98 ml/m LA Vol (A4C):   79.0 ml 44.26 ml/m LA Biplane Vol: 71.2 ml 39.89 ml/m  AORTIC VALVE AV Area (Vmax):    1.41 cm AV Area (Vmean):   1.34 cm AV Area (VTI):     1.66 cm AV Vmax:           272.50 cm/s AV Vmean:          156.000 cm/s AV VTI:            0.490 m AV Peak Grad:      29.7 mmHg AV Mean Grad:      13.0 mmHg LVOT Vmax:         101.00 cm/s LVOT Vmean:        55.100 cm/s LVOT VTI:          0.215 m LVOT/AV VTI ratio: 0.44  AORTA Ao Root diam: 2.70 cm TRICUSPID VALVE TR Peak grad:   67.9 mmHg TR Vmax:        412.00 cm/s  SHUNTS Systemic VTI:  0.21  m Systemic Diam: 2.20 cm Lyman Bishop MD Electronically signed by Lyman Bishop MD Signature Date/Time: 10/02/2019/11:17:44 AM    Final     Patient Profile     74 y.o. female with past medical history of aortic stenosis status post aortic valve replacement, atrial fibrillation, prior embolic CVA, coronary artery disease, hypertension, chronic diastolic congestive heart failure, hyperlipidemia, prior renal transplant, lung cancer for evaluation of acute on chronic diastolic congestive heart failure.  Echocardiogram this admission shows normal LV function, mild left ventricular hypertrophy, severe pulmonary hypertension, biatrial enlargement, mild to moderate mitral and tricuspid regurgitation, prior aortic valve replacement with mean gradient 13 mmHg and trace aortic insufficiency.    Assessment & Plan    1 acute on chronic diastolic congestive heart failure-Wt 82.6 kg; I/O-14. Pt remains volume overloaded. Increase Lasix to 80 mg twice daily and follow renal function closely.  LV function is normal on echocardiogram and aortic valve is functioning well.  2 acute on chronic stage IV kidney disease/prior renal transplant-creatinine mildly increased today compared to yesterday.  Nephrology following.  Follow renal function closely with diuresis  particularly in light of prior transplant.  3 prior aortic valve replacement-functioning normally on echocardiogram this admission.  4 paroxysmal atrial fibrillation-patient remains in sinus rhythm.  Continue metoprolol and Coumadin.  5 history of coronary artery disease-no chest pain.  Continue statin.  No aspirin given need for Coumadin.  For questions or updates, please contact [place] Please consult [email or web address] for contact info under        Signed, [name], MD  10/04/2019, 7:37 AM

## 2019-10-04 NOTE — Progress Notes (Signed)
[facility]  PROGRESS NOTE  Hanne Kegg FGH:[id number] DOB: 25-Jun-1946 DOA: 10/01/2019 PCP: [name], MD   Brief HPI:   74 year old female with a history of ESRD s/p cadaveric transplant in 2012, CAD with RCA stent in the past, hypertension admitted with shortness of breath, generalized weakness.  BNP found to be elevated 2000, creatinine was up from baseline 1.8-2.9.  Patient mated with decompensated diastolic heart failure.  Nephrology was consulted for help with diuresis considering patient's history of cadaveric transplant 2012, CKD stage IIIb 4.    Subjective   Patient seen and examined, leg swelling is improved.  Left upper extremity venous duplex was negative for DVT.   Assessment/Plan:     1. Acute on chronic diastolic CHF-patient started on IV Lasix, Lasix dose increased to 80 mg IV every 12 hours.  Appreciate cardiology input. 2. Left arm swelling-nephrology ordered left upper extremity venous duplex which is negative for DVT. 3. S/p renal transplant/CKD stage IIIb/IV-s/p cadaveric transplant in 2012, continue prednisone, dapsone, Prograf.  Follow nephrology recommendations. 4. Paroxysmal atrial fibrillation-continue warfarin, metoprolol 5. ?  UTI-urine culture only growing insignificant growth.  Will discontinue IV ceftriaxone. 6. Hypertension-blood pressure stable, continue metoprolol. 7. Diabetes mellitus type 2-continue sliding scale insulin NovoLog, Lantus. 8. Chronic hypoxic respiratory failure-continue supplemental oxygen.    SpO2: 98 % O2 Flow Rate (L/min): 2 L/min   COVID-19 Labs  No results for input(s): DDIMER, FERRITIN, LDH, CRP in the last 72 hours.  Lab Results  Component Value Date   [place] NEGATIVE 10/01/2019   [place] NEGATIVE 12/19/2018     CBG: Recent Labs  Lab 10/03/19 1110 10/03/19 1742 10/03/19 2138 10/04/19 0617 10/04/19 1215  GLUCAP 118* 150* 246* 110* 132*    CBC: Recent Labs  Lab 10/01/19 1724  10/03/19 0609  WBC 6.6 7.8  NEUTROABS  --  6.2  HGB 11.5* 10.9*  HCT 39.8 37.1  MCV 87.1 86.1  PLT 237 678    Basic Metabolic Panel: Recent Labs  Lab 10/01/19 1724 10/02/19 0301 10/03/19 0635 10/04/19 0514  NA 138 139 141 141  K 3.7 4.2 3.3* 4.5  CL 101 103 101 103  CO2 23 24 27 26   GLUCOSE 326* 164* 107* 124*  BUN 69* 67* 64* 68*  CREATININE 2.92* 2.74* 2.85* 3.08*  CALCIUM 9.4 9.3 9.2 9.4  MG 2.2  --   --   --   PHOS  --   --  4.7*  --      Liver Function Tests: Recent Labs  Lab 10/03/19 0635  ALBUMIN 2.4*        DVT prophylaxis: Warfarin  Code Status: Full code  Family Communication: No family at bedside  Disposition Plan: 74 year old female admitted with diastolic heart failure, has CKD stage IIIb 4.  Both cardiology and nephrology have been consulted.  Barrier to discharge- started on IV Lasix for diuresis.         Scheduled medications:  . dapsone  25 mg Oral Daily  . insulin aspart  0-15 Units Subcutaneous TID WC  . insulin aspart  0-5 Units Subcutaneous QHS  . insulin glargine  30 Units Subcutaneous Daily  . levothyroxine  50 mcg Oral QAC breakfast  . metoprolol tartrate  12.5 mg Oral BID  . predniSONE  5 mg Oral Q breakfast  . rosuvastatin  20 mg Oral QODAY  . sodium chloride flush  3 mL Intravenous Q12H  . tacrolimus  3 mg Oral BID  . [START ON 10/08/2019] warfarin  2.5 mg Oral Once per day on Mon  . warfarin  5 mg Oral Once per day on Sun Tue Wed Thu Fri  . Warfarin - Pharmacist Dosing Inpatient   Does not apply q1800    Consultants: Nephrology  Procedures:    Antibiotics:   Anti-infectives (From admission, onward)   Start     Dose/Rate Route Frequency Ordered Stop   10/02/19 2200  cefTRIAXone (ROCEPHIN) 1 g in sodium chloride 0.9 % 100 mL IVPB  Status:  Discontinued     1 g 200 mL/hr over 30 Minutes Intravenous Every 24 hours 10/01/19 2314 10/03/19 1910   10/02/19 1000  dapsone tablet 25 mg     25 mg Oral Daily 10/01/19  2318     10/01/19 2315  cefTRIAXone (ROCEPHIN) 1 g in sodium chloride 0.9 % 100 mL IVPB     1 g 200 mL/hr over 30 Minutes Intravenous  Once 10/01/19 2301 10/01/19 2350       Objective   Vitals:   10/04/19 0043 10/04/19 0427 10/04/19 0905 10/04/19 1211  BP:  (!) 174/90 140/68 (!) 158/66  Pulse:  (!) 109 88 (!) 56  Resp:  19 16 18   Temp:  98.4 F (36.9 C)  98.1 F (36.7 C)  TempSrc:  Oral  Oral  SpO2:  100% 94% 98%  Weight: 82.6 kg     Height:        Intake/Output Summary (Last 24 hours) at 10/04/2019 1455 Last data filed at 10/04/2019 1423 Gross per 24 hour  Intake 802 ml  Output 400 ml  Net 402 ml    03/16 1901 - 03/18 0700 In: 1412.9 [P.O.:1303; I.V.:9.9] Out: 1600 [Urine:1600]  Filed Weights   10/02/19 1500 10/03/19 0405 10/04/19 0043  Weight: 81.2 kg 82.3 kg 82.6 kg    Physical Examination:   General-appears in no acute distress Heart-S1-S2, regular, no murmur auscultated Lungs-clear to auscultation bilaterally, no wheezing or crackles auscultated Abdomen-soft, nontender, no organomegaly Extremities-bilateral 1+ pitting edema of the lower extremities.  Left upper extremity is edematous. Neuro-alert, oriented x3, no focal deficit noted   Data Reviewed:   Recent Results (from the past 240 hour(s))  SARS CORONAVIRUS 2 (TAT 6-24 HRS) Nasopharyngeal Nasopharyngeal Swab     Status: None   Collection Time: 10/01/19  9:29 PM   Specimen: Nasopharyngeal Swab  Result Value Ref Range Status   SARS Coronavirus 2 NEGATIVE NEGATIVE Final    Comment: (NOTE) SARS-CoV-2 target nucleic acids are NOT DETECTED. The SARS-CoV-2 RNA is generally detectable in upper and lower respiratory specimens during the acute phase of infection. Negative results do not preclude SARS-CoV-2 infection, do not rule out co-infections with other pathogens, and should not be used as the sole basis for treatment or other patient management decisions. Negative results must be combined with  clinical observations, patient history, and epidemiological information. The expected result is Negative. Fact Sheet for Patients: SugarRoll.be Fact Sheet for Healthcare Providers: https://www.woods-mathews.com/ This test is not yet approved or cleared by the [place] FDA and  has been authorized for detection and/or diagnosis of SARS-CoV-2 by FDA under an Emergency Use Authorization (EUA). This EUA will remain  in effect (meaning this test can be used) for the duration of the COVID-19 declaration under Section 56 4(b)(1) of the Act, 21 U.S.C. section 360bbb-3(b)(1), unless the authorization is terminated or revoked sooner. Performed at [facility], [place] [address] [place], [place] [address]   Culture, Urine     Status:  Abnormal   Collection Time: 10/02/19 12:25 AM   Specimen: Urine, Clean Catch  Result Value Ref Range Status   Specimen Description URINE, CLEAN CATCH  Final   Special Requests NONE  Final   Culture (A)  Final    <10,000 COLONIES/mL INSIGNIFICANT GROWTH Performed at [facility], [address] [place], [place] [address]    Report Status 10/03/2019 FINAL  Final    No results for input(s): LIPASE, AMYLASE in the last 168 hours. No results for input(s): AMMONIA in the last 168 hours.  Cardiac Enzymes: No results for input(s): CKTOTAL, CKMB, CKMBINDEX, TROPONINI in the last 168 hours. BNP (last 3 results) Recent Labs    12/19/18 1621 12/23/18 0444 10/01/19 1724  BNP 1,637.0* 1,655.2* 2,092.5*    ProBNP (last 3 results) No results for input(s): PROBNP in the last 8760 hours.  Studies:  VAS [place] UPPER EXTREMITY VENOUS DUPLEX  Result Date: 10/04/2019 UPPER VENOUS STUDY  Indications: Swelling Other Indications: AVF in L arm, swelling not relieved by Lasix. Comparison Study: 12/20/18 Performing Technologist: Abram Sander RVS  Examination Guidelines: A complete evaluation includes B-mode imaging, spectral  Doppler, color Doppler, and power Doppler as needed of all accessible portions of each vessel. Bilateral testing is considered an integral part of a complete examination. Limited examinations for reoccurring indications may be performed as noted.  Left Findings: +----------+------------+---------+-----------+----------+-------+ LEFT      CompressiblePhasicitySpontaneousPropertiesSummary +----------+------------+---------+-----------+----------+-------+ IJV           Full       Yes       Yes                      +----------+------------+---------+-----------+----------+-------+ Subclavian    Full       Yes       Yes                      +----------+------------+---------+-----------+----------+-------+ Axillary      Full       Yes       Yes                      +----------+------------+---------+-----------+----------+-------+ Brachial      Full       Yes       Yes                      +----------+------------+---------+-----------+----------+-------+ Radial        Full                                          +----------+------------+---------+-----------+----------+-------+ Ulnar         Full                                          +----------+------------+---------+-----------+----------+-------+ Cephalic      Full                                          +----------+------------+---------+-----------+----------+-------+ Basilic  AVF   +----------+------------+---------+-----------+----------+-------+  Summary:  Left: No evidence of deep vein thrombosis in the upper extremity. No evidence of superficial vein thrombosis in the upper extremity.  *See table(s) above for measurements and observations.  Diagnosing physician: Servando Snare MD Electronically signed by Servando Snare MD on 10/04/2019 at 1:55:03 PM.    Final      Admission status: The appropriate admission status for this patient is INPATIENT.  Inpatient status is judged to be reasonable and necessary in order to provide the required intensity of service to ensure the patient's safety. The patient's presenting symptoms, physical exam findings, and initial radiographic and laboratory data in the context of their chronic comorbidities is felt to place them at high risk for further clinical deterioration. Furthermore, it is not anticipated that the patient will be medically stable for discharge from the hospital within 2 midnights of admission. The following factors support the admission status of inpatient.     The patient's presenting symptoms include.  Bilateral lower extremity edema, left upper extremity swelling The worrisome physical exam findings include bilateral lower extremity edema The initial radiographic and laboratory data are worrisome because of CHF exacerbation The chronic co-morbidities include CKD stage IIIb/IV       * I certify that at the point of admission it is my clinical judgment that the patient will require inpatient hospital care spanning beyond 2 midnights from the point of admission due to high intensity of service, high risk for further deterioration and high frequency of surveillance required.Oswald Hillock   [facility] If 7PM-7AM, please contact night-coverage at [email or web address], Office  [phone]   10/04/2019, 2:55 PM  LOS: 2 days

## 2019-10-04 NOTE — Progress Notes (Signed)
Upper extremity venous has been completed.   Preliminary results in CV Proc.   Abram Sander 10/04/2019 1:52 PM

## 2019-10-04 NOTE — Progress Notes (Signed)
ANTICOAGULATION CONSULT NOTE - Follow-Up Consult  Pharmacy Consult for Warfarin  Indication: atrial fibrillation  Allergies  Allergen Reactions  . Ibuprofen Nausea And Vomiting  . Sulfamethoxazole-Trimethoprim Itching, Swelling and Rash    Swelling of the face  . Sulfonamide Derivatives Itching, Swelling and Rash    Swelling of the face  . Tape Rash    Paper tape is ok  . Tramadol Nausea And Vomiting  . Doxycycline Nausea Only  . Hydrocil [Psyllium] Nausea And Vomiting  . Bactrim Itching, Swelling and Rash  . Red Dye Itching and Rash    Patient Measurements: Height: 5' (152.4 cm) Weight: 182 lb 1.6 oz (82.6 kg) IBW/kg (Calculated) : 45.5  Vital Signs: Temp: 98.4 F (36.9 C) (03/18 0427) Temp Source: Oral (03/18 0427) BP: 174/90 (03/18 0427) Pulse Rate: 109 (03/18 0427)  Labs: Recent Labs    10/01/19 1724 10/01/19 2125 10/02/19 0301 10/03/19 0609 10/03/19 0635 10/04/19 0514  HGB 11.5*  --   --  10.9*  --   --   HCT 39.8  --   --  37.1  --   --   PLT 237  --   --  249  --   --   LABPROT  --    < > 22.4* 27.6*  --  26.9*  INR  --    < > 2.0* 2.6*  --  2.5*  CREATININE 2.92*  --  2.74*  --  2.85* 3.08*   < > = values in this interval not displayed.    Estimated Creatinine Clearance: 15 mL/min (A) (by C-G formula based on SCr of 3.08 mg/dL (H)).   Medical History: Past Medical History:  Diagnosis Date  . Acute CHF (congestive heart failure) ([place]) 12/2018  . Adenocarcinoma of lung, stage 1, right ([place]) 11/25/2017  . Anemia, iron deficiency    of chronic disease  . Aortic stenosis    a. Severe AS by echo 11/2012.  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 Asystole ([place])    a. During ENT surgery 2005: developed marked asystole requiring CPR, felt due to vagal reaction (cath nonobst dz).  . Carotid artery disease ([place])    a. Carotid Dopplers performed in August 2013 showed 40-59% left stenosis and 0-39% right; f/u recommended in 2 years.   .  Cerebrovascular accident [facility]) 2009   a. LMCA infarct felt embolic 0254, maintained on chronic coumadin.; denies residual on 04/05/2013  . Cholelithiasis   . Chronic Persistent Atrial Fibrillation 12/31/2008   Qualifier: Diagnosis of  By: [name]    . Coronary artery disease 05/2002   a. Ant MI 2003 s/p PTCA/stent to RCA.   . Diverticulosis of colon   . Esophagitis, reflux   . ESRD (end stage renal disease) ([place])    a. Mass on L kidney per pt s/p nephrectomy - pt states not cancer - [facility] notes indicate ESRD due to HTN/DM - was previously on HD. b. Kidney transplant 02/2011.  Marland Kitchen GERD (gastroesophageal reflux disease)   . Gout   . Hearing loss   . Helicobacter pylori (H. pylori) infection    hx of  . Hemorrhoids   . Hx of colonic polyps    adenomatous  . Hyperlipidemia   . Hypertension   . Lung nodule seen on imaging study 04/07/2013   1.0 cm ground glass opacity RUL  . Myocardial infarction ([place]) 2003  . Pericardial effusion    a. Small by echo 11/2011.  Marland Kitchen  S/P aortic valve replacement with bioprosthetic valve and maze procedure 04/12/2013   31mm [facility] Ease bovine pericardial tissue valve   . S/P Maze operation for atrial fibrillation 04/12/2013   Complete bilateral atrial lesion set using bipolar radiofrequency and cryothermy ablation with clipping of LA appendage  . Sleep apnea    Pt says testing was positive, intolerant of CPAP.  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  . Stroke ([place])   . Type II diabetes mellitus [facility])    Assessment: 74 y/o F with chief complaint of bilateral lower extremity swelling, on warfarin PTA for hx Afib/CVA.  Warfarin regimen per outpatient anti-coag notes: 2.5 mg on Mondays, 5 mg all other days  INR remains therapeutic at 2.5, no reports of bleeding  Goal of Therapy:  INR  2-3 Monitor platelets by anticoagulation protocol: Yes   Plan:  Continue home regimen of 5mg  daily, except 2.5mg  on Monday Daily INR, s/s bleeding  Bertis Ruddy, PharmD Clinical Pharmacist Please check AMION for all [place] numbers 10/04/2019 7:24 AM

## 2019-10-05 LAB — BASIC METABOLIC PANEL
Anion gap: 13 (ref 5–15)
BUN: 71 mg/dL — ABNORMAL HIGH (ref 8–23)
CO2: 24 mmol/L (ref 22–32)
Calcium: 9.4 mg/dL (ref 8.9–10.3)
Chloride: 103 mmol/L (ref 98–111)
Creatinine, Ser: 3 mg/dL — ABNORMAL HIGH (ref 0.44–1.00)
GFR calc Af Amer: 17 mL/min — ABNORMAL LOW (ref >60)
GFR calc non Af Amer: 15 mL/min — ABNORMAL LOW (ref >60)
Glucose, Bld: 174 mg/dL — ABNORMAL HIGH (ref 70–99)
Potassium: 4 mmol/L (ref 3.5–5.1)
Sodium: 140 mmol/L (ref 135–145)

## 2019-10-05 LAB — GLUCOSE, CAPILLARY
Glucose-Capillary: 196 mg/dL — ABNORMAL HIGH (ref 70–99)
Glucose-Capillary: 165 mg/dL — ABNORMAL HIGH (ref 70–99)
Glucose-Capillary: 123 mg/dL — ABNORMAL HIGH (ref 70–99)
Glucose-Capillary: 122 mg/dL — ABNORMAL HIGH (ref 70–99)

## 2019-10-05 LAB — PROTIME-INR
INR: 2.4 — ABNORMAL HIGH (ref 0.8–1.2)
Prothrombin Time: 26.1 seconds — ABNORMAL HIGH (ref 11.4–15.2)

## 2019-10-05 MED ORDER — POTASSIUM CHLORIDE CRYS ER 20 MEQ PO TBCR
40.0000 meq | EXTENDED_RELEASE_TABLET | Freq: Once | ORAL | Status: AC
  Administered 2019-10-05: 40 meq via ORAL
  Filled 2019-10-05: qty 2

## 2019-10-05 MED ORDER — FUROSEMIDE 10 MG/ML IJ SOLN
120.0000 mg | Freq: Two times a day (BID) | INTRAVENOUS | Status: DC
  Administered 2019-10-05 – 2019-10-08 (×8): 120 mg via INTRAVENOUS
  Filled 2019-10-05: qty 10
  Filled 2019-10-05: qty 12
  Filled 2019-10-05: qty 2
  Filled 2019-10-05 (×3): qty 12
  Filled 2019-10-05: qty 10
  Filled 2019-10-05 (×2): qty 12
  Filled 2019-10-05: qty 10
  Filled 2019-10-05: qty 12

## 2019-10-05 NOTE — Progress Notes (Signed)
ANTICOAGULATION CONSULT NOTE - Follow-Up Consult  Pharmacy Consult for Warfarin  Indication: atrial fibrillation  Allergies  Allergen Reactions  . Ibuprofen Nausea And Vomiting  . Sulfamethoxazole-Trimethoprim Itching, Swelling and Rash    Swelling of the face  . Sulfonamide Derivatives Itching, Swelling and Rash    Swelling of the face  . Tape Rash    Paper tape is ok  . Tramadol Nausea And Vomiting  . Doxycycline Nausea Only  . Hydrocil [Psyllium] Nausea And Vomiting  . Bactrim Itching, Swelling and Rash  . Red Dye Itching and Rash    Patient Measurements: Height: 5' (152.4 cm) Weight: 182 lb 1.6 oz (82.6 kg) IBW/kg (Calculated) : 45.5  Vital Signs: Temp: 97.5 F (36.4 C) (03/19 0428) Temp Source: Oral (03/19 0428) BP: 163/74 (03/19 0428) Pulse Rate: 97 (03/19 0428)  Labs: Recent Labs    10/03/19 0609 10/03/19 6269 10/04/19 0514 10/05/19 0502  HGB 10.9*  --   --   --   HCT 37.1  --   --   --   PLT 249  --   --   --   LABPROT 27.6*  --  26.9* 26.1*  INR 2.6*  --  2.5* 2.4*  CREATININE  --  2.85* 3.08* 3.00*    Estimated Creatinine Clearance: 15.4 mL/min (A) (by C-G formula based on SCr of 3 mg/dL (H)).   Medical History: Past Medical History:  Diagnosis Date  . Acute CHF (congestive heart failure) ([place]) 12/2018  . Adenocarcinoma of lung, stage 1, right ([place]) 11/25/2017  . Anemia, iron deficiency    of chronic disease  . Aortic stenosis    a. Severe AS by echo 11/2012.  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 Asystole ([place])    a. During ENT surgery 2005: developed marked asystole requiring CPR, felt due to vagal reaction (cath nonobst dz).  . Carotid artery disease ([place])    a. Carotid Dopplers performed in August 2013 showed 40-59% left stenosis and 0-39% right; f/u recommended in 2 years.   . Cerebrovascular accident [facility]) 2009   a. LMCA infarct felt embolic 4854, maintained on chronic coumadin.; denies residual on 04/05/2013  .  Cholelithiasis   . Chronic Persistent Atrial Fibrillation 12/31/2008   Qualifier: Diagnosis of  By: [name]    . Coronary artery disease 05/2002   a. Ant MI 2003 s/p PTCA/stent to RCA.   . Diverticulosis of colon   . Esophagitis, reflux   . ESRD (end stage renal disease) ([place])    a. Mass on L kidney per pt s/p nephrectomy - pt states not cancer - [facility] notes indicate ESRD due to HTN/DM - was previously on HD. b. Kidney transplant 02/2011.  Marland Kitchen GERD (gastroesophageal reflux disease)   . Gout   . Hearing loss   . Helicobacter pylori (H. pylori) infection    hx of  . Hemorrhoids   . Hx of colonic polyps    adenomatous  . Hyperlipidemia   . Hypertension   . Lung nodule seen on imaging study 04/07/2013   1.0 cm ground glass opacity RUL  . Myocardial infarction ([place]) 2003  . Pericardial effusion    a. Small by echo 11/2011.  . S/P aortic valve replacement with bioprosthetic valve and maze procedure 04/12/2013   100mm [facility] Ease bovine pericardial tissue valve   . S/P Maze operation for atrial fibrillation 04/12/2013   Complete bilateral atrial lesion set using bipolar radiofrequency and cryothermy ablation  with clipping of LA appendage  . Sleep apnea    Pt says testing was positive, intolerant of CPAP.  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  . Stroke ([place])   . Type II diabetes mellitus [facility])    Assessment: 74 y/o F with chief complaint of bilateral lower extremity swelling, on warfarin PTA for hx Afib/CVA.  Warfarin regimen per outpatient anti-coag notes: 2.5 mg on Mondays, 5 mg all other days  INR remains therapeutic at 2.4, no reports of bleeding  Goal of Therapy:  INR 2-3 Monitor platelets by anticoagulation protocol: Yes   Plan:  Continue home regimen of 5mg  daily, except 2.5mg  on Monday Daily INR, s/s  bleeding  Bertis Ruddy, PharmD Clinical Pharmacist Please check AMION for all [place] numbers 10/05/2019 7:11 AM

## 2019-10-05 NOTE — Progress Notes (Signed)
[facility]  PROGRESS NOTE  [name] PJK:[id number] DOB: [birth year] DOA: 10/01/2019 PCP: [name], MD   Brief HPI:   74 year old female with a history of ESRD s/p cadaveric transplant in 2012, CAD with RCA stent in the past, hypertension admitted with shortness of breath, generalized weakness.  BNP found to be elevated 2000, creatinine was up from baseline 1.8-2.9.  Patient mated with decompensated diastolic heart failure.  Nephrology was consulted for help with diuresis considering patient's history of cadaveric transplant 2012, CKD stage IIIb 4.    Subjective   Patient seen and examined, leg swelling has improved.  Still continues to have left upper extremity swelling.  Left upper extremity venous duplex was negative for DVT.   Assessment/Plan:     1. Acute on chronic diastolic CHF-patient started on IV Lasix, Lasix dose increased to 120 mg IV every 12 hours.  Appreciate cardiology input. 2. Left arm swelling-nephrology ordered left upper extremity venous duplex which is negative for DVT. 3. S/p renal transplant/CKD stage IIIb/IV-s/p cadaveric transplant in 2012, continue prednisone, dapsone, Prograf.  Creatinine stable at 3.0.  Follow nephrology recommendations. 4. Paroxysmal atrial fibrillation-continue warfarin, metoprolol 5. ?  UTI-urine culture only growing insignificant growth.  IV ceftriaxone was discontinued.   6. Hypertension-blood pressure stable, continue metoprolol. 7. Diabetes mellitus type 2-continue sliding scale insulin NovoLog, Lantus. 8. Chronic hypoxic respiratory failure-continue supplemental oxygen.    SpO2: 99 % O2 Flow Rate (L/min): 3 L/min   COVID-19 Labs  No results for input(s): DDIMER, FERRITIN, LDH, CRP in the last 72 hours.  Lab Results  Component Value Date   [place] NEGATIVE 10/01/2019   [place] NEGATIVE 12/19/2018     CBG: Recent Labs  Lab 10/04/19 1215 10/04/19 1601 10/04/19 2056 10/05/19 0647  10/05/19 1142  GLUCAP 132* 256* 200* 196* 165*    CBC: Recent Labs  Lab 10/01/19 1724 10/03/19 0609  WBC 6.6 7.8  NEUTROABS  --  6.2  HGB 11.5* 10.9*  HCT 39.8 37.1  MCV 87.1 86.1  PLT 237 809    Basic Metabolic Panel: Recent Labs  Lab 10/01/19 1724 10/02/19 0301 10/03/19 0635 10/04/19 0514 10/05/19 0502  NA 138 139 141 141 140  K 3.7 4.2 3.3* 4.5 4.0  CL 101 103 101 103 103  CO2 23 24 27 26 24   GLUCOSE 326* 164* 107* 124* 174*  BUN 69* 67* 64* 68* 71*  CREATININE 2.92* 2.74* 2.85* 3.08* 3.00*  CALCIUM 9.4 9.3 9.2 9.4 9.4  MG 2.2  --   --   --   --   PHOS  --   --  4.7*  --   --      Liver Function Tests: Recent Labs  Lab 10/03/19 0635  ALBUMIN 2.4*        DVT prophylaxis: Warfarin  Code Status: Full code  Family Communication: No family at bedside  Disposition Plan: 74 year old female admitted with diastolic heart failure, has CKD stage IIIb 4.  Both cardiology and nephrology have been consulted.  Barrier to discharge- started on IV Lasix for diuresis.         Scheduled medications:  . Chlorhexidine Gluconate Cloth  6 each Topical Daily  . dapsone  25 mg Oral Daily  . insulin aspart  0-15 Units Subcutaneous TID WC  . insulin aspart  0-5 Units Subcutaneous QHS  . insulin glargine  30 Units Subcutaneous Daily  . levothyroxine  50 mcg Oral QAC breakfast  . metoprolol tartrate  12.5 mg Oral  BID  . predniSONE  5 mg Oral Q breakfast  . rosuvastatin  20 mg Oral QODAY  . sodium chloride flush  3 mL Intravenous Q12H  . tacrolimus  3 mg Oral BID  . [START ON 10/08/2019] warfarin  2.5 mg Oral Once per day on Mon  . warfarin  5 mg Oral Once per day on Sun Tue Wed Thu Fri  . Warfarin - Pharmacist Dosing Inpatient   Does not apply q1800    Consultants: Nephrology  Procedures:    Antibiotics:   Anti-infectives (From admission, onward)   Start     Dose/Rate Route Frequency Ordered Stop   10/02/19 2200  cefTRIAXone (ROCEPHIN) 1 g in sodium  chloride 0.9 % 100 mL IVPB  Status:  Discontinued     1 g 200 mL/hr over 30 Minutes Intravenous Every 24 hours 10/01/19 2314 10/03/19 1910   10/02/19 1000  dapsone tablet 25 mg     25 mg Oral Daily 10/01/19 2318     10/01/19 2315  cefTRIAXone (ROCEPHIN) 1 g in sodium chloride 0.9 % 100 mL IVPB     1 g 200 mL/hr over 30 Minutes Intravenous  Once 10/01/19 2301 10/01/19 2350       Objective   Vitals:   10/05/19 0419 10/05/19 0428 10/05/19 0812 10/05/19 1139  BP:  (!) 163/74 139/73 (!) 157/62  Pulse:  97 88 (!) 52  Resp:  20 16 19   Temp:  (!) 97.5 F (36.4 C)  98.1 F (36.7 C)  TempSrc:  Oral  Oral  SpO2:  98% 94% 99%  Weight: 82.6 kg 82.7 kg    Height:        Intake/Output Summary (Last 24 hours) at 10/05/2019 1330 Last data filed at 10/05/2019 0829 Gross per 24 hour  Intake 682 ml  Output 750 ml  Net -68 ml    03/17 1901 - 03/19 0700 In: 8921 [P.O.:1142] Out: 1150 [Urine:1150]  Filed Weights   10/04/19 0043 10/05/19 0419 10/05/19 0428  Weight: 82.6 kg 82.6 kg 82.7 kg    Physical Examination:   General-appears in no acute distress Heart-S1-S2, regular, no murmur auscultated Lungs-clear to auscultation bilaterally, no wheezing or crackles auscultated Abdomen-soft, nontender, no organomegaly Extremities-bilateral 1+ pitting edema of the lower extremities.  Left upper extremity is edematous. Neuro-alert, oriented x3, no focal deficit noted   Data Reviewed:   Recent Results (from the past 240 hour(s))  SARS CORONAVIRUS 2 (TAT 6-24 HRS) Nasopharyngeal Nasopharyngeal Swab     Status: None   Collection Time: 10/01/19  9:29 PM   Specimen: Nasopharyngeal Swab  Result Value Ref Range Status   SARS Coronavirus 2 NEGATIVE NEGATIVE Final    Comment: (NOTE) SARS-CoV-2 target nucleic acids are NOT DETECTED. The SARS-CoV-2 RNA is generally detectable in upper and lower respiratory specimens during the acute phase of infection. Negative results do not preclude SARS-CoV-2  infection, do not rule out co-infections with other pathogens, and should not be used as the sole basis for treatment or other patient management decisions. Negative results must be combined with clinical observations, patient history, and epidemiological information. The expected result is Negative. Fact Sheet for Patients: SugarRoll.be Fact Sheet for Healthcare Providers: https://www.woods-mathews.com/ This test is not yet approved or cleared by the [place] FDA and  has been authorized for detection and/or diagnosis of SARS-CoV-2 by FDA under an Emergency Use Authorization (EUA). This EUA will remain  in effect (meaning this test can be used) for the duration of the  COVID-19 declaration under Section 56 4(b)(1) of the Act, 21 U.S.C. section 360bbb-3(b)(1), unless the authorization is terminated or revoked sooner. Performed at [facility], [place] [address], [place], [place] [address]   Culture, Urine     Status: Abnormal   Collection Time: 10/02/19 12:25 AM   Specimen: Urine, Clean Catch  Result Value Ref Range Status   Specimen Description URINE, CLEAN CATCH  Final   Special Requests NONE  Final   Culture (A)  Final    <10,000 COLONIES/mL INSIGNIFICANT GROWTH Performed at [facility], [name] [address] [place], [place] [address]    Report Status 10/03/2019 FINAL  Final    No results for input(s): LIPASE, AMYLASE in the last 168 hours. No results for input(s): AMMONIA in the last 168 hours.  Cardiac Enzymes: No results for input(s): CKTOTAL, CKMB, CKMBINDEX, TROPONINI in the last 168 hours. BNP (last 3 results) Recent Labs    12/19/18 1621 12/23/18 0444 10/01/19 1724  BNP 1,637.0* 1,655.2* 2,092.5*       Admission status: The appropriate admission status for this patient is INPATIENT. Inpatient status is judged to be reasonable and necessary in order to provide the required intensity of service to ensure the  patient's safety. The patient's presenting symptoms, physical exam findings, and initial radiographic and laboratory data in the context of their chronic comorbidities is felt to place them at high risk for further clinical deterioration. Furthermore, it is not anticipated that the patient will be medically stable for discharge from the hospital within 2 midnights of admission. The following factors support the admission status of inpatient.  The patient's presenting symptoms include.  Bilateral lower extremity edema, left upper extremity swelling The worrisome physical exam findings include bilateral lower extremity edema The initial radiographic and laboratory data are worrisome because of CHF exacerbation The chronic co-morbidities include CKD stage IIIb/IV   * I certify that at the point of admission it is my clinical judgment that the patient will require inpatient hospital care spanning beyond 2 midnights from the point of admission due to high intensity of service, high risk for further deterioration and high frequency of surveillance required.Oswald Hillock   [facility] If 7PM-7AM, please contact night-coverage at [email or web address], Office  [phone]   10/05/2019, 1:30 PM  LOS: 3 days

## 2019-10-05 NOTE — Progress Notes (Addendum)
[facility] Progress Note  Name: [name] MRN: [id number] DOB: 11-17-1945  Chief Complaint:  Leg swelling  Subjective:  Lasix increased to 120 IV BID   Intake/Output Summary (Last 24 hours) at 10/05/2019 1636 Last data filed at 10/05/2019 0829 Gross per 24 hour  Intake 460 ml  Output 750 ml  Net -290 ml    Vitals:  Vitals:   10/05/19 0419 10/05/19 0428 10/05/19 0812 10/05/19 1139  BP:  (!) 163/74 139/73 (!) 157/62  Pulse:  97 88 (!) 52  Resp:  20 16 19   Temp:  (!) 97.5 F (36.4 C)  98.1 F (36.7 C)  TempSrc:  Oral  Oral  SpO2:  98% 94% 99%  Weight: 82.6 kg 82.7 kg    Height:         Physical Exam:  General adult female in bed in no acute distress HEENT normocephalic atraumatic extraocular movements intact sclera anicteric Neck supple trachea midline Lungs basilar crackles Heart regular rate and rhythm no rubs or gallops appreciated Abdomen soft nontender nondistended; allograft nontender Extremities 1+ edema lower extremities and LUE  Psych normal mood and affect LUE AVF with thrill/bruit   Medications reviewed     Labs:  BMP Latest Ref Rng & Units 10/05/2019 10/04/2019 10/03/2019  Glucose 70 - 99 mg/dL 174(H) 124(H) 107(H)  BUN 8 - 23 mg/dL 71(H) 68(H) 64(H)  Creatinine 0.44 - 1.00 mg/dL 3.00(H) 3.08(H) 2.85(H)  BUN/Creat Ratio 12 - 28 - - -  Sodium 135 - 145 mmol/L 140 141 141  Potassium 3.5 - 5.1 mmol/L 4.0 4.5 3.3(L)  Chloride 98 - 111 mmol/L 103 103 101  CO2 22 - 32 mmol/L 24 26 27   Calcium 8.9 - 10.3 mg/dL 9.4 9.4 9.2     Assessment/Plan:   # Acute on chronic diastolic CHF  - Lasix 269 IV  BID per cardiology - on supp oxygen  - duplex negative  # CKD stage IV   - 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; 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  - with progression   # Renal transplant  - on immunosuppression as below  - s/p eval with [facility] per primary nephrologist and not felt to need  transplant biopsy at that time and felt to have diabetic kidney disease; per outpatient charting hx nephrotic range proteinuria  - repeat UA   # Immunosuppression - on prednisone and prograf  - Has been on dapsone prophylaxis -ordered prograf level  # Hypertension   - With overload  - diuretics as above  # Chronic hypoxic resp failure - on supplemental oxygen   # Leukocyturia  - on ceftriaxone  - Note culture with insignificant growth   # Anemia - normocytic - mild at present    Madelon Lips, MD 10/05/2019 4:36 PM

## 2019-10-05 NOTE — Progress Notes (Signed)
Progress Note  Patient Name: [name] Date of Encounter: 10/05/2019  Primary Cardiologist: [name], MD   Subjective   No CP; still with DOE  Inpatient Medications    Scheduled Meds: . Chlorhexidine Gluconate Cloth  6 each Topical Daily  . dapsone  25 mg Oral Daily  . insulin aspart  0-15 Units Subcutaneous TID WC  . insulin aspart  0-5 Units Subcutaneous QHS  . insulin glargine  30 Units Subcutaneous Daily  . levothyroxine  50 mcg Oral QAC breakfast  . metoprolol tartrate  12.5 mg Oral BID  . predniSONE  5 mg Oral Q breakfast  . rosuvastatin  20 mg Oral QODAY  . sodium chloride flush  3 mL Intravenous Q12H  . tacrolimus  3 mg Oral BID  . [START ON 10/08/2019] warfarin  2.5 mg Oral Once per day on Mon  . warfarin  5 mg Oral Once per day on Sun Tue Wed Thu Fri  . Warfarin - Pharmacist Dosing Inpatient   Does not apply q1800   Continuous Infusions: . sodium chloride Stopped (10/02/19 2324)   PRN Meds: sodium chloride, acetaminophen, bisacodyl, HYDROcodone-acetaminophen, ondansetron (ZOFRAN) IV, polyethylene glycol, polyvinyl alcohol, sodium chloride flush   Vital Signs    Vitals:   10/04/19 1947 10/04/19 2114 10/05/19 0419 10/05/19 0428  BP: 140/88   (!) 163/74  Pulse: (!) 59 61  97  Resp: 20   20  Temp: (!) 97.4 F (36.3 C)   (!) 97.5 F (36.4 C)  TempSrc: Oral   Oral  SpO2: 90%   98%  Weight:   82.6 kg 82.7 kg  Height:        Intake/Output Summary (Last 24 hours) at 10/05/2019 0748 Last data filed at 10/05/2019 0500 Gross per 24 hour  Intake 802 ml  Output 750 ml  Net 52 ml   Last 3 Weights 10/05/2019 10/05/2019 10/04/2019  Weight (lbs) 182 lb 6.4 oz 182 lb 1.6 oz 182 lb 1.6 oz  Weight (kg) 82.736 kg 82.6 kg 82.6 kg      Telemetry    Sinus- Personally Reviewed   Physical Exam   GEN: No acute distress.  WD WN Neck: Positive JVD, supple Cardiac: RRR 2/6 systolic murmur Respiratory: CTA GI: Soft, NT/ND MS: 1+ edema lower ext and LUE  Neuro:  Grossly intact Psych: Normal affect   Labs     Chemistry Recent Labs  Lab 10/03/19 0635 10/04/19 0514 10/05/19 0502  NA 141 141 140  K 3.3* 4.5 4.0  CL 101 103 103  CO2 27 26 24   GLUCOSE 107* 124* 174*  BUN 64* 68* 71*  CREATININE 2.85* 3.08* 3.00*  CALCIUM 9.2 9.4 9.4  ALBUMIN 2.4*  --   --   GFRNONAA 16* 14* 15*  GFRAA 18* 16* 17*  ANIONGAP 13 12 13      Hematology Recent Labs  Lab 10/01/19 1724 10/03/19 0609  WBC 6.6 7.8  RBC 4.57 4.31  HGB 11.5* 10.9*  HCT 39.8 37.1  MCV 87.1 86.1  MCH 25.2* 25.3*  MCHC 28.9* 29.4*  RDW 20.7* 20.2*  PLT 237 249    BNP Recent Labs  Lab 10/01/19 1724  BNP 2,092.5*     Radiology    VAS [place] UPPER EXTREMITY VENOUS DUPLEX  Result Date: 10/04/2019 UPPER VENOUS STUDY  Indications: Swelling Other Indications: AVF in L arm, swelling not relieved by Lasix. Comparison Study: 12/20/18 Performing Technologist: Abram Sander RVS  Examination Guidelines: A complete evaluation includes B-mode imaging,  spectral Doppler, color Doppler, and power Doppler as needed of all accessible portions of each vessel. Bilateral testing is considered an integral part of a complete examination. Limited examinations for reoccurring indications may be performed as noted.  Left Findings: +----------+------------+---------+-----------+----------+-------+ LEFT      CompressiblePhasicitySpontaneousPropertiesSummary +----------+------------+---------+-----------+----------+-------+ IJV           Full       Yes       Yes                      +----------+------------+---------+-----------+----------+-------+ Subclavian    Full       Yes       Yes                      +----------+------------+---------+-----------+----------+-------+ Axillary      Full       Yes       Yes                      +----------+------------+---------+-----------+----------+-------+ Brachial      Full       Yes       Yes                       +----------+------------+---------+-----------+----------+-------+ Radial        Full                                          +----------+------------+---------+-----------+----------+-------+ Ulnar         Full                                          +----------+------------+---------+-----------+----------+-------+ Cephalic      Full                                          +----------+------------+---------+-----------+----------+-------+ Basilic                                               AVF   +----------+------------+---------+-----------+----------+-------+  Summary:  Left: No evidence of deep vein thrombosis in the upper extremity. No evidence of superficial vein thrombosis in the upper extremity.  *See table(s) above for measurements and observations.  Diagnosing physician: Servando Snare MD Electronically signed by Servando Snare MD on 10/04/2019 at 1:55:03 PM.    Final     Patient Profile     74 y.o. female with past medical history of aortic stenosis status post aortic valve replacement, atrial fibrillation, prior embolic CVA, coronary artery disease, hypertension, chronic diastolic congestive heart failure, hyperlipidemia, prior renal transplant, lung cancer for evaluation of acute on chronic diastolic congestive heart failure.  Echocardiogram this admission shows normal LV function, mild left ventricular hypertrophy, severe pulmonary hypertension, biatrial enlargement, mild to moderate mitral and tricuspid regurgitation, prior aortic valve replacement with mean gradient 13 mmHg and trace aortic insufficiency.    Assessment & Plan    1 acute on chronic diastolic congestive heart failure-Wt 82.7 kg; I/O+52. Pt remains volume overloaded.  Will increase  Lasix to 120 mg IV twice daily.  As her volume status improves will discharge on Demadex.  LV function is normal on echocardiogram and aortic valve is functioning well.  2 acute on chronic stage IV kidney disease/prior  renal transplant-creatinine unchanged this morning.  Nephrology following.  Follow renal function closely with diuresis particularly in light of prior transplant.  3 prior aortic valve replacement-functioning normally on echocardiogram this admission.  4 paroxysmal atrial fibrillation-patient remains in sinus rhythm.  Continue metoprolol and Coumadin.  5 history of coronary artery disease-no chest pain.  Continue statin.  No aspirin given need for Coumadin.  For questions or updates, please contact [place] Please consult [email or web address] for contact info under        Signed, [name], MD  10/05/2019, 7:48 AM

## 2019-10-06 LAB — GLUCOSE, CAPILLARY
Glucose-Capillary: 109 mg/dL — ABNORMAL HIGH (ref 70–99)
Glucose-Capillary: 63 mg/dL — ABNORMAL LOW (ref 70–99)
Glucose-Capillary: 150 mg/dL — ABNORMAL HIGH (ref 70–99)
Glucose-Capillary: 230 mg/dL — ABNORMAL HIGH (ref 70–99)
Glucose-Capillary: 232 mg/dL — ABNORMAL HIGH (ref 70–99)

## 2019-10-06 LAB — BASIC METABOLIC PANEL
Anion gap: 13 (ref 5–15)
BUN: 72 mg/dL — ABNORMAL HIGH (ref 8–23)
CO2: 23 mmol/L (ref 22–32)
Calcium: 9.5 mg/dL (ref 8.9–10.3)
Chloride: 105 mmol/L (ref 98–111)
Creatinine, Ser: 2.81 mg/dL — ABNORMAL HIGH (ref 0.44–1.00)
GFR calc Af Amer: 18 mL/min — ABNORMAL LOW (ref >60)
GFR calc non Af Amer: 16 mL/min — ABNORMAL LOW (ref >60)
Glucose, Bld: 72 mg/dL (ref 70–99)
Potassium: 3.9 mmol/L (ref 3.5–5.1)
Sodium: 141 mmol/L (ref 135–145)

## 2019-10-06 LAB — PROTIME-INR
INR: 2.5 — ABNORMAL HIGH (ref 0.8–1.2)
Prothrombin Time: 27 seconds — ABNORMAL HIGH (ref 11.4–15.2)

## 2019-10-06 MED ORDER — WARFARIN SODIUM 5 MG PO TABS
5.0000 mg | ORAL_TABLET | ORAL | Status: DC
  Administered 2019-10-06: 5 mg via ORAL
  Filled 2019-10-06: qty 1

## 2019-10-06 MED ORDER — METOLAZONE 5 MG PO TABS
5.0000 mg | ORAL_TABLET | Freq: Once | ORAL | Status: AC
  Administered 2019-10-06: 5 mg via ORAL
  Filled 2019-10-06: qty 1

## 2019-10-06 NOTE — Discharge Instructions (Signed)

## 2019-10-06 NOTE — Progress Notes (Signed)
Hypoglycemic Event  CBG: 63 at 1208  Treatment: 4 oz juice/soda plus meal  Symptoms: None  Follow-up CBG: Time:1307 CBG Result: 150  Possible Reasons for Event: Inadequate meal intake  MD notified @ 1324.  Joni Reining

## 2019-10-06 NOTE — Progress Notes (Signed)
[facility] Progress Note  Name: [name] MRN: [id number] DOB: [birth year]  Chief Complaint:  Leg swelling  Subjective:  Feeling better, still on some o2.  UOP noted about 1 L, added metolazone today to Lasix   Intake/Output Summary (Last 24 hours) at 10/06/2019 1231 Last data filed at 10/06/2019 0858 Gross per 24 hour  Intake 537.5 ml  Output 1000 ml  Net -462.5 ml    Vitals:  Vitals:   10/05/19 2300 10/06/19 0428 10/06/19 0439 10/06/19 1140  BP:   (!) 150/66 (!) 166/73  Pulse:   78 (!) 56  Resp:   20 18  Temp:   99 F (37.2 C) 97.8 F (36.6 C)  TempSrc:   Oral Oral  SpO2: 98%  98% 98%  Weight:  82.2 kg    Height:         Physical Exam:  General NAD, sitting up eating lunch HEENT normocephalic atraumatic extraocular movements intact sclera anicteric Neck supple trachea midline Lungs basilar crackles, improving Heart regular rate and rhythm no rubs or gallops appreciated Abdomen soft nontender nondistended; allograft nontender Extremities 1+ edema lower extremities and LUE  Psych normal mood and affect LUE AVF with thrill/bruit   Medications reviewed     Labs:  BMP Latest Ref Rng & Units 10/06/2019 10/05/2019 10/04/2019  Glucose 70 - 99 mg/dL 72 174(H) 124(H)  BUN 8 - 23 mg/dL 72(H) 71(H) 68(H)  Creatinine 0.44 - 1.00 mg/dL 2.81(H) 3.00(H) 3.08(H)  BUN/Creat Ratio 12 - 28 - - -  Sodium 135 - 145 mmol/L 141 140 141  Potassium 3.5 - 5.1 mmol/L 3.9 4.0 4.5  Chloride 98 - 111 mmol/L 105 103 103  CO2 22 - 32 mmol/L 23 24 26   Calcium 8.9 - 10.3 mg/dL 9.5 9.4 9.4     Assessment/Plan:   # Acute on chronic diastolic CHF  - Lasix 403 IV  BID and metolazone per cardiology, agree  - on supp oxygen  - duplex negative  # CKD stage IV   - 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; 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  - with progression   # Renal transplant  - on immunosuppression as below  - s/p eval with [facility]  per primary nephrologist and not felt to need transplant biopsy at that time and felt to have diabetic kidney disease; per outpatient charting hx nephrotic range proteinuria     # Immunosuppression - on prednisone and prograf  - Has been on dapsone prophylaxis -ordered prograf level--> pending  # Hypertension   - With overload  - diuretics as above  # Chronic hypoxic resp failure - on supplemental oxygen   # Leukocyturia  - on ceftriaxone  - Note culture with insignificant growth   # Anemia - normocytic - mild at present    Madelon Lips, MD 10/06/2019 12:31 PM

## 2019-10-06 NOTE — Plan of Care (Signed)
  Problem: Education: Goal: Ability to demonstrate management of disease process will improve Outcome: Progressing  Patient able to discuss with MD concerns regarding lasix not working adequately to reduce FVO. Zaroxalyn added.

## 2019-10-06 NOTE — Plan of Care (Signed)
Nutrition Education Note  **RD working remotely**  RD consulted for nutrition education regarding low sodium diet.  Pt states she does not like canned foods. Pt reports her husband does most of the cooking at home and states he "gets carried away sometimes" when salting foods. RD attempted to discuss pt's home diet in more detail, but the call was disconnected. RD tried calling 3 more times and was unsuccessful at reaching patient.   RD provided "Low Sodium Nutrition Therapy" handout from the Academy of [employer]. Provided examples on ways to decrease sodium intake in diet. Discouraged intake of processed foods and use of salt shaker. Encouraged fresh fruits and vegetables as well as whole grain sources of carbohydrates to maximize fiber intake. Also emphasized the role of fluids, foods to avoid, and importance of weighing self daily. Teach back method used.  Expect fair compliance.  Body mass index is 35.39 kg/m. Pt meets criteria for obesity based on current BMI.  Current diet order is carb modified, patient is consuming approximately 90% of meals at this time. Labs and medications reviewed. No further nutrition interventions warranted at this time. RD contact information provided. If additional nutrition issues arise, please re-consult RD.   Larkin Ina, MS, RD, LDN RD pager number and weekend/on-call pager number located in [place].

## 2019-10-06 NOTE — Progress Notes (Addendum)
[facility]  PROGRESS NOTE  [name] AJG:[id number] DOB: [birth year] DOA: 10/01/2019 PCP: [name], MD   Brief HPI:   74 year old female with a history of ESRD s/p cadaveric transplant in 2012, CAD with RCA stent in the past, hypertension admitted with shortness of breath, generalized weakness.  BNP found to be elevated 2000, creatinine was up from baseline 1.8-2.9.  Patient mated with decompensated diastolic heart failure.  Nephrology was consulted for help with diuresis considering patient's history of cadaveric transplant 2012, CKD stage IIIb 4.    Subjective   Patient seen and examined, leg swelling has improved.  Denies shortness of breath.   Assessment/Plan:     1. Acute on chronic diastolic CHF-patient started on IV Lasix, Lasix dose increased to 120 mg IV every 12 hours.  Metolazone added by cardiology.  Appreciate cardiology input. 2. Left arm swelling-nephrology ordered left upper extremity venous duplex which is negative for DVT. 3. S/p renal transplant/CKD stage IIIb/IV-s/p cadaveric transplant in 2012, continue prednisone, dapsone, Prograf.  Creatinine slowly improving, today creatinine 2.81.  Nephrology following.   4. Paroxysmal atrial fibrillation-continue warfarin, metoprolol 5. ?  UTI-urine culture only growing insignificant growth.  IV ceftriaxone was discontinued.   6. Hypertension-blood pressure stable, continue metoprolol. 7. Diabetes mellitus type 2-continue sliding scale insulin NovoLog, Lantus. 8. Chronic hypoxic respiratory failure-continue supplemental oxygen.    SpO2: 98 % O2 Flow Rate (L/min): 3 L/min   COVID-19 Labs  No results for input(s): DDIMER, FERRITIN, LDH, CRP in the last 72 hours.  Lab Results  Component Value Date   [place] NEGATIVE 10/01/2019   [place] NEGATIVE 12/19/2018     CBG: Recent Labs  Lab 10/05/19 0647 10/05/19 1142 10/05/19 1703 10/05/19 2107 10/06/19 0628  GLUCAP 196* 165* 123* 122* 109*     CBC: Recent Labs  Lab 10/01/19 1724 10/03/19 0609  WBC 6.6 7.8  NEUTROABS  --  6.2  HGB 11.5* 10.9*  HCT 39.8 37.1  MCV 87.1 86.1  PLT 237 355    Basic Metabolic Panel: Recent Labs  Lab 10/01/19 1724 10/01/19 1724 10/02/19 0301 10/03/19 0635 10/04/19 0514 10/05/19 0502 10/06/19 0337  NA 138   < > 139 141 141 140 141  K 3.7   < > 4.2 3.3* 4.5 4.0 3.9  CL 101   < > 103 101 103 103 105  CO2 23   < > 24 27 26 24 23   GLUCOSE 326*   < > 164* 107* 124* 174* 72  BUN 69*   < > 67* 64* 68* 71* 72*  CREATININE 2.92*   < > 2.74* 2.85* 3.08* 3.00* 2.81*  CALCIUM 9.4   < > 9.3 9.2 9.4 9.4 9.5  MG 2.2  --   --   --   --   --   --   PHOS  --   --   --  4.7*  --   --   --    < > = values in this interval not displayed.     Liver Function Tests: Recent Labs  Lab 10/03/19 0635  ALBUMIN 2.4*        DVT prophylaxis: Warfarin  Code Status: Full code  Family Communication: No family at bedside  Disposition Plan: 74 year old female admitted with diastolic heart failure, has CKD stage IIIb 4.  Both cardiology and nephrology have been consulted.  Barrier to discharge- started on IV Lasix for diuresis.         Scheduled medications:  .  Chlorhexidine Gluconate Cloth  6 each Topical Daily  . dapsone  25 mg Oral Daily  . insulin aspart  0-15 Units Subcutaneous TID WC  . insulin aspart  0-5 Units Subcutaneous QHS  . insulin glargine  30 Units Subcutaneous Daily  . levothyroxine  50 mcg Oral QAC breakfast  . metolazone  5 mg Oral Once  . metoprolol tartrate  12.5 mg Oral BID  . predniSONE  5 mg Oral Q breakfast  . rosuvastatin  20 mg Oral QODAY  . sodium chloride flush  3 mL Intravenous Q12H  . tacrolimus  3 mg Oral BID  . [START ON 10/08/2019] warfarin  2.5 mg Oral Once per day on Mon  . warfarin  5 mg Oral Once per day on Sun Tue Wed Thu Fri  . Warfarin - Pharmacist Dosing Inpatient   Does not apply q1800     Consultants: Nephrology  Procedures:    Antibiotics:   Anti-infectives (From admission, onward)   Start     Dose/Rate Route Frequency Ordered Stop   10/02/19 2200  cefTRIAXone (ROCEPHIN) 1 g in sodium chloride 0.9 % 100 mL IVPB  Status:  Discontinued     1 g 200 mL/hr over 30 Minutes Intravenous Every 24 hours 10/01/19 2314 10/03/19 1910   10/02/19 1000  dapsone tablet 25 mg     25 mg Oral Daily 10/01/19 2318     10/01/19 2315  cefTRIAXone (ROCEPHIN) 1 g in sodium chloride 0.9 % 100 mL IVPB     1 g 200 mL/hr over 30 Minutes Intravenous  Once 10/01/19 2301 10/01/19 2350       Objective   Vitals:   10/05/19 2049 10/05/19 2300 10/06/19 0428 10/06/19 0439  BP: 104/70   (!) 150/66  Pulse: (!) 59   78  Resp: 19   20  Temp: 98.2 F (36.8 C)   99 F (37.2 C)  TempSrc: Oral   Oral  SpO2: 99% 98%  98%  Weight:   82.2 kg   Height:        Intake/Output Summary (Last 24 hours) at 10/06/2019 1133 Last data filed at 10/06/2019 0858 Gross per 24 hour  Intake 600.77 ml  Output 1000 ml  Net -399.23 ml    03/18 1901 - 03/20 0700 In: 720.8 [P.O.:600; I.V.:8.8] Out: 1750 [Urine:1750]  Filed Weights   10/05/19 0419 10/05/19 0428 10/06/19 0428  Weight: 82.6 kg 82.7 kg 82.2 kg    Physical Examination:   General-appears in no acute distress Heart-S1-S2, regular, no murmur auscultated Lungs-clear to auscultation bilaterally, no wheezing or crackles auscultated Abdomen-soft, nontender, no organomegaly Extremities-trace edema of the lower extremities noted.  Left upper extremity is edematous. Neuro-alert, oriented x3, no focal deficit noted  Data Reviewed:   Recent Results (from the past 240 hour(s))  SARS CORONAVIRUS 2 (TAT 6-24 HRS) Nasopharyngeal Nasopharyngeal Swab     Status: None   Collection Time: 10/01/19  9:29 PM   Specimen: Nasopharyngeal Swab  Result Value Ref Range Status   SARS Coronavirus 2 NEGATIVE NEGATIVE Final    Comment: (NOTE) SARS-CoV-2 target  nucleic acids are NOT DETECTED. The SARS-CoV-2 RNA is generally detectable in upper and lower respiratory specimens during the acute phase of infection. Negative results do not preclude SARS-CoV-2 infection, do not rule out co-infections with other pathogens, and should not be used as the sole basis for treatment or other patient management decisions. Negative results must be combined with clinical observations, patient history, and epidemiological information. The  expected result is Negative. Fact Sheet for Patients: SugarRoll.be Fact Sheet for Healthcare Providers: https://www.woods-mathews.com/ This test is not yet approved or cleared by the [place] FDA and  has been authorized for detection and/or diagnosis of SARS-CoV-2 by FDA under an Emergency Use Authorization (EUA). This EUA will remain  in effect (meaning this test can be used) for the duration of the COVID-19 declaration under Section 56 4(b)(1) of the Act, 21 U.S.C. section 360bbb-3(b)(1), unless the authorization is terminated or revoked sooner. Performed at [facility], [place] [address], [place], [place] [address]   Culture, Urine     Status: Abnormal   Collection Time: 10/02/19 12:25 AM   Specimen: Urine, Clean Catch  Result Value Ref Range Status   Specimen Description URINE, CLEAN CATCH  Final   Special Requests NONE  Final   Culture (A)  Final    <10,000 COLONIES/mL INSIGNIFICANT GROWTH Performed at [facility], [place] [address], [place], [place] [address]    Report Status 10/03/2019 FINAL  Final    No results for input(s): LIPASE, AMYLASE in the last 168 hours. No results for input(s): AMMONIA in the last 168 hours.  Cardiac Enzymes: No results for input(s): CKTOTAL, CKMB, CKMBINDEX, TROPONINI in the last 168 hours. BNP (last 3 results) Recent Labs    12/19/18 1621 12/23/18 0444 10/01/19 1724  BNP 1,637.0* 1,655.2* 2,092.5*       Admission  status: The appropriate admission status for this patient is INPATIENT. Inpatient status is judged to be reasonable and necessary in order to provide the required intensity of service to ensure the patient's safety. The patient's presenting symptoms, physical exam findings, and initial radiographic and laboratory data in the context of their chronic comorbidities is felt to place them at high risk for further clinical deterioration. Furthermore, it is not anticipated that the patient will be medically stable for discharge from the hospital within 2 midnights of admission. The following factors support the admission status of inpatient.  The patient's presenting symptoms include.  Bilateral lower extremity edema, left upper extremity swelling The worrisome physical exam findings include bilateral lower extremity edema The initial radiographic and laboratory data are worrisome because of CHF exacerbation The chronic co-morbidities include CKD stage IIIb/IV   * I certify that at the point of admission it is my clinical judgment that the patient will require inpatient hospital care spanning beyond 2 midnights from the point of admission due to high intensity of service, high risk for further deterioration and high frequency of surveillance required.Oswald Hillock   [facility] If 7PM-7AM, please contact night-coverage at [email or web address], Office  [phone]   10/06/2019, 11:33 AM  LOS: 4 days

## 2019-10-06 NOTE — Progress Notes (Signed)
ANTICOAGULATION CONSULT NOTE - Follow-Up Consult  Pharmacy Consult for Warfarin  Indication: atrial fibrillation  Allergies  Allergen Reactions  . Ibuprofen Nausea And Vomiting  . Sulfamethoxazole-Trimethoprim Itching, Swelling and Rash    Swelling of the face  . Sulfonamide Derivatives Itching, Swelling and Rash    Swelling of the face  . Tape Rash    Paper tape is ok  . Tramadol Nausea And Vomiting  . Doxycycline Nausea Only  . Hydrocil [Psyllium] Nausea And Vomiting  . Bactrim Itching, Swelling and Rash  . Red Dye Itching and Rash    Patient Measurements: Height: 5' (152.4 cm) Weight: 181 lb 3.2 oz (82.2 kg) IBW/kg (Calculated) : 45.5  Vital Signs: Temp: 99 F (37.2 C) (03/20 0439) Temp Source: Oral (03/20 0439) BP: 150/66 (03/20 0439) Pulse Rate: 78 (03/20 0439)  Labs: Recent Labs    10/04/19 0514 10/05/19 0502 10/06/19 0337  LABPROT 26.9* 26.1* 27.0*  INR 2.5* 2.4* 2.5*  CREATININE 3.08* 3.00* 2.81*    Estimated Creatinine Clearance: 16.4 mL/min (A) (by C-G formula based on SCr of 2.81 mg/dL (H)).   Medical History: Past Medical History:  Diagnosis Date  . Acute CHF (congestive heart failure) ([place]) 12/2018  . Adenocarcinoma of lung, stage 1, right ([place]) 11/25/2017  . Anemia, iron deficiency    of chronic disease  . Aortic stenosis    a. Severe AS by echo 11/2012.  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 Asystole ([place])    a. During ENT surgery 2005: developed marked asystole requiring CPR, felt due to vagal reaction (cath nonobst dz).  . Carotid artery disease ([place])    a. Carotid Dopplers performed in August 2013 showed 40-59% left stenosis and 0-39% right; f/u recommended in 2 years.   . Cerebrovascular accident [facility]) 2009   a. LMCA infarct felt embolic 3875, maintained on chronic coumadin.; denies residual on 04/05/2013  . Cholelithiasis   . Chronic Persistent Atrial Fibrillation 12/31/2008   Qualifier: Diagnosis of  By:  [name]    . Coronary artery disease 05/2002   a. Ant MI 2003 s/p PTCA/stent to RCA.   . Diverticulosis of colon   . Esophagitis, reflux   . ESRD (end stage renal disease) ([place])    a. Mass on L kidney per pt s/p nephrectomy - pt states not cancer - [facility] notes indicate ESRD due to HTN/DM - was previously on HD. b. Kidney transplant 02/2011.  Marland Kitchen GERD (gastroesophageal reflux disease)   . Gout   . Hearing loss   . Helicobacter pylori (H. pylori) infection    hx of  . Hemorrhoids   . Hx of colonic polyps    adenomatous  . Hyperlipidemia   . Hypertension   . Lung nodule seen on imaging study 04/07/2013   1.0 cm ground glass opacity RUL  . Myocardial infarction ([place]) 2003  . Pericardial effusion    a. Small by echo 11/2011.  . S/P aortic valve replacement with bioprosthetic valve and maze procedure 04/12/2013   45mm [facility] Ease bovine pericardial tissue valve   . S/P Maze operation for atrial fibrillation 04/12/2013   Complete bilateral atrial lesion set using bipolar radiofrequency and cryothermy ablation with clipping of LA appendage  . Sleep apnea    Pt says testing was positive, intolerant of CPAP.  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  . Stroke ([place])   . Type II diabetes mellitus [facility])    Assessment: 74 y/o F with chief complaint of bilateral lower extremity swelling, on warfarin PTA for hx Afib/CVA.  Warfarin regimen per outpatient anti-coag notes: 2.5 mg on Mondays, 5 mg all other days  INR remains therapeutic at 2.5, no reports of bleeding  Goal of Therapy:  INR 2-3 Monitor platelets by anticoagulation protocol: Yes   Plan:  Continue home regimen of 5mg  daily, except 2.5mg  on Monday Daily INR, s/s bleeding  Sherren Kerns, PharmD PGY1 Acute Care Pharmacy Resident Please check AMION for all [place] numbers 10/06/2019 9:30 AM

## 2019-10-06 NOTE — Plan of Care (Signed)
  Problem: Activity: Goal: Risk for activity intolerance will decrease 10/06/2019 1217 by Joni Reining, RN Outcome: Progressing 10/06/2019 1216 by Joni Reining, RN Outcome: Progressing  Pt up to bedside with no sob.  Problem: Nutrition: Goal: Adequate nutrition will be maintained 10/06/2019 1217 by Joni Reining, RN Outcome: Progressing  Pt w/ blood sugar of 63, ate lunch, denies symptoms. MD notified, blood sugar rechecked.

## 2019-10-06 NOTE — Progress Notes (Signed)
Progress Note  Patient Name: [name] Date of Encounter: 10/06/2019  Primary Cardiologist: [name], MD   Subjective   Pt says breathing a little better  Frustrated wt still up   No CP  Feet hurt  L Inpatient Medications    Scheduled Meds: . Chlorhexidine Gluconate Cloth  6 each Topical Daily  . dapsone  25 mg Oral Daily  . insulin aspart  0-15 Units Subcutaneous TID WC  . insulin aspart  0-5 Units Subcutaneous QHS  . insulin glargine  30 Units Subcutaneous Daily  . levothyroxine  50 mcg Oral QAC breakfast  . metoprolol tartrate  12.5 mg Oral BID  . predniSONE  5 mg Oral Q breakfast  . rosuvastatin  20 mg Oral QODAY  . sodium chloride flush  3 mL Intravenous Q12H  . tacrolimus  3 mg Oral BID  . [START ON 10/08/2019] warfarin  2.5 mg Oral Once per day on Mon  . warfarin  5 mg Oral Once per day on Sun Tue Wed Thu Fri  . Warfarin - Pharmacist Dosing Inpatient   Does not apply q1800   Continuous Infusions: . sodium chloride Stopped (10/02/19 2324)  . furosemide 120 mg (10/05/19 1734)   PRN Meds: sodium chloride, acetaminophen, bisacodyl, HYDROcodone-acetaminophen, ondansetron (ZOFRAN) IV, polyethylene glycol, polyvinyl alcohol, sodium chloride flush   Vital Signs    Vitals:   10/05/19 2049 10/05/19 2300 10/06/19 0428 10/06/19 0439  BP: 104/70   (!) 150/66  Pulse: (!) 59   78  Resp: 19   20  Temp: 98.2 F (36.8 C)   99 F (37.2 C)  TempSrc: Oral   Oral  SpO2: 99% 98%  98%  Weight:   82.2 kg   Height:        Intake/Output Summary (Last 24 hours) at 10/06/2019 0841 Last data filed at 10/06/2019 0429 Gross per 24 hour  Intake 360.77 ml  Output 1000 ml  Net -639.23 ml   Net NEg 2.6 L    Last 3 Weights 10/06/2019 10/05/2019 10/05/2019  Weight (lbs) 181 lb 3.2 oz 182 lb 6.4 oz 182 lb 1.6 oz  Weight (kg) 82.192 kg 82.736 kg 82.6 kg      Telemetry    SR with short burst PAT  Personally Reviewed   Physical Exam   GEN: No acute distress.  WD WN Neck   JVP increased to below jaw Cardiac: RRR 2/6 systolic murmur Respiratory: Decreased airflow    GI: Soft, NT/ND MS: 1+ edema lower ext and LUE Neuro:  Grossly intact Psych: Normal affect   Labs     Chemistry Recent Labs  Lab 10/03/19 0635 10/03/19 0635 10/04/19 0514 10/05/19 0502 10/06/19 0337  NA 141   < > 141 140 141  K 3.3*   < > 4.5 4.0 3.9  CL 101   < > 103 103 105  CO2 27   < > 26 24 23   GLUCOSE 107*   < > 124* 174* 72  BUN 64*   < > 68* 71* 72*  CREATININE 2.85*   < > 3.08* 3.00* 2.81*  CALCIUM 9.2   < > 9.4 9.4 9.5  ALBUMIN 2.4*  --   --   --   --   GFRNONAA 16*   < > 14* 15* 16*  GFRAA 18*   < > 16* 17* 18*  ANIONGAP 13   < > 12 13 13    < > = values in this interval not displayed.  Hematology Recent Labs  Lab 10/01/19 1724 10/03/19 0609  WBC 6.6 7.8  RBC 4.57 4.31  HGB 11.5* 10.9*  HCT 39.8 37.1  MCV 87.1 86.1  MCH 25.2* 25.3*  MCHC 28.9* 29.4*  RDW 20.7* 20.2*  PLT 237 249    BNP Recent Labs  Lab 10/01/19 1724  BNP 2,092.5*     Radiology    VAS [place] UPPER EXTREMITY VENOUS DUPLEX  Result Date: 10/04/2019 UPPER VENOUS STUDY  Indications: Swelling Other Indications: AVF in L arm, swelling not relieved by Lasix. Comparison Study: 12/20/18 Performing Technologist: Abram Sander RVS  Examination Guidelines: A complete evaluation includes B-mode imaging, spectral Doppler, color Doppler, and power Doppler as needed of all accessible portions of each vessel. Bilateral testing is considered an integral part of a complete examination. Limited examinations for reoccurring indications may be performed as noted.  Left Findings: +----------+------------+---------+-----------+----------+-------+ LEFT      CompressiblePhasicitySpontaneousPropertiesSummary +----------+------------+---------+-----------+----------+-------+ IJV           Full       Yes       Yes                      +----------+------------+---------+-----------+----------+-------+  Subclavian    Full       Yes       Yes                      +----------+------------+---------+-----------+----------+-------+ Axillary      Full       Yes       Yes                      +----------+------------+---------+-----------+----------+-------+ Brachial      Full       Yes       Yes                      +----------+------------+---------+-----------+----------+-------+ Radial        Full                                          +----------+------------+---------+-----------+----------+-------+ Ulnar         Full                                          +----------+------------+---------+-----------+----------+-------+ Cephalic      Full                                          +----------+------------+---------+-----------+----------+-------+ Basilic                                               AVF   +----------+------------+---------+-----------+----------+-------+  Summary:  Left: No evidence of deep vein thrombosis in the upper extremity. No evidence of superficial vein thrombosis in the upper extremity.  *See table(s) above for measurements and observations.  Diagnosing physician: Servando Snare MD Electronically signed by Servando Snare MD on 10/04/2019 at 1:55:03 PM.    Final     Patient Profile  74 y.o. female with past medical history of aortic stenosis status post aortic valve replacement, atrial fibrillation, prior embolic CVA, coronary artery disease, hypertension, chronic diastolic congestive heart failure, hyperlipidemia, prior renal transplant, lung cancer for evaluation of acute on chronic diastolic congestive heart failure.  Echocardiogram this admission shows normal LV function, mild left ventricular hypertrophy, severe pulmonary hypertension, biatrial enlargement, mild to moderate mitral and tricuspid regurgitation, prior aortic valve replacement with mean gradient 13 mmHg and trace aortic insufficiency.    Assessment & Plan    1  acute on chronic diastolic congestive heart failure  Volume is still up on exam  Wll add 5 zaroxylyn once  COntinue IV lasix   2 acute on chronic stage IV kidney disease/prior renal transplant-  REnal funciton 2.81    3 prior aortic valve replacement-functioning normally on echocardiogram this admission.  4 paroxysmal atrial fibrillation-Keep on anticoagulation    Remains in SR    5 history of coronary artery disease-no chest pain.  Continue statin.  No aspirin given need for Coumadin.  For questions or updates, please contact [place] Please consult [email or web address] for contact info under        Signed, Dorris Carnes, MD  10/06/2019, 8:41 AM

## 2019-10-07 LAB — BASIC METABOLIC PANEL
Anion gap: 14 (ref 5–15)
BUN: 68 mg/dL — ABNORMAL HIGH (ref 8–23)
CO2: 27 mmol/L (ref 22–32)
Calcium: 9.9 mg/dL (ref 8.9–10.3)
Chloride: 101 mmol/L (ref 98–111)
Creatinine, Ser: 2.84 mg/dL — ABNORMAL HIGH (ref 0.44–1.00)
GFR calc Af Amer: 18 mL/min — ABNORMAL LOW (ref >60)
GFR calc non Af Amer: 16 mL/min — ABNORMAL LOW (ref >60)
Glucose, Bld: 147 mg/dL — ABNORMAL HIGH (ref 70–99)
Potassium: 3.5 mmol/L (ref 3.5–5.1)
Sodium: 142 mmol/L (ref 135–145)

## 2019-10-07 LAB — GLUCOSE, CAPILLARY
Glucose-Capillary: 126 mg/dL — ABNORMAL HIGH (ref 70–99)
Glucose-Capillary: 77 mg/dL (ref 70–99)
Glucose-Capillary: 178 mg/dL — ABNORMAL HIGH (ref 70–99)
Glucose-Capillary: 268 mg/dL — ABNORMAL HIGH (ref 70–99)

## 2019-10-07 LAB — PROTIME-INR
INR: 2.7 — ABNORMAL HIGH (ref 0.8–1.2)
Prothrombin Time: 28.4 seconds — ABNORMAL HIGH (ref 11.4–15.2)

## 2019-10-07 MED ORDER — POTASSIUM CHLORIDE CRYS ER 20 MEQ PO TBCR
20.0000 meq | EXTENDED_RELEASE_TABLET | Freq: Every day | ORAL | Status: DC
  Administered 2019-10-07 – 2019-10-11 (×4): 20 meq via ORAL
  Filled 2019-10-07 (×5): qty 1

## 2019-10-07 MED ORDER — METOLAZONE 5 MG PO TABS
5.0000 mg | ORAL_TABLET | Freq: Once | ORAL | Status: AC
  Administered 2019-10-07: 5 mg via ORAL
  Filled 2019-10-07: qty 1

## 2019-10-07 MED ORDER — WARFARIN SODIUM 2.5 MG PO TABS
2.5000 mg | ORAL_TABLET | Freq: Once | ORAL | Status: AC
  Administered 2019-10-07: 18:00:00 2.5 mg via ORAL
  Filled 2019-10-07: qty 1

## 2019-10-07 NOTE — Progress Notes (Signed)
ANTICOAGULATION CONSULT NOTE - Follow-Up Consult  Pharmacy Consult for Warfarin  Indication: atrial fibrillation  Allergies  Allergen Reactions  . Ibuprofen Nausea And Vomiting  . Sulfamethoxazole-Trimethoprim Itching, Swelling and Rash    Swelling of the face  . Sulfonamide Derivatives Itching, Swelling and Rash    Swelling of the face  . Tape Rash    Paper tape is ok  . Tramadol Nausea And Vomiting  . Doxycycline Nausea Only  . Hydrocil [Psyllium] Nausea And Vomiting  . Bactrim Itching, Swelling and Rash  . Red Dye Itching and Rash    Patient Measurements: Height: 5' (152.4 cm) Weight: 179 lb 1.6 oz (81.2 kg) IBW/kg (Calculated) : 45.5  Vital Signs: Temp: 97.7 F (36.5 C) (03/21 0917) Temp Source: Oral (03/21 0917) BP: 152/65 (03/21 0917) Pulse Rate: 73 (03/21 0917)  Labs: Recent Labs    10/05/19 0502 10/06/19 0337 10/07/19 0409  LABPROT 26.1* 27.0* 28.4*  INR 2.4* 2.5* 2.7*  CREATININE 3.00* 2.81* 2.84*    Estimated Creatinine Clearance: 16.2 mL/min (A) (by C-G formula based on SCr of 2.84 mg/dL (H)).   Medical History: Past Medical History:  Diagnosis Date  . Acute CHF (congestive heart failure) ([place]) 12/2018  . Adenocarcinoma of lung, stage 1, right ([place]) 11/25/2017  . Anemia, iron deficiency    of chronic disease  . Aortic stenosis    a. Severe AS by echo 11/2012.  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 Asystole ([place])    a. During ENT surgery 2005: developed marked asystole requiring CPR, felt due to vagal reaction (cath nonobst dz).  . Carotid artery disease ([place])    a. Carotid Dopplers performed in August 2013 showed 40-59% left stenosis and 0-39% right; f/u recommended in 2 years.   . Cerebrovascular accident [facility]) 2009   a. LMCA infarct felt embolic 1610, maintained on chronic coumadin.; denies residual on 04/05/2013  . Cholelithiasis   . Chronic Persistent Atrial Fibrillation 12/31/2008   Qualifier: Diagnosis of  By:  [name]    . Coronary artery disease 05/2002   a. Ant MI 2003 s/p PTCA/stent to RCA.   . Diverticulosis of colon   . Esophagitis, reflux   . ESRD (end stage renal disease) ([place])    a. Mass on L kidney per pt s/p nephrectomy - pt states not cancer - [facility] notes indicate ESRD due to HTN/DM - was previously on HD. b. Kidney transplant 02/2011.  Marland Kitchen GERD (gastroesophageal reflux disease)   . Gout   . Hearing loss   . Helicobacter pylori (H. pylori) infection    hx of  . Hemorrhoids   . Hx of colonic polyps    adenomatous  . Hyperlipidemia   . Hypertension   . Lung nodule seen on imaging study 04/07/2013   1.0 cm ground glass opacity RUL  . Myocardial infarction ([place]) 2003  . Pericardial effusion    a. Small by echo 11/2011.  . S/P aortic valve replacement with bioprosthetic valve and maze procedure 04/12/2013   58mm [facility] Ease bovine pericardial tissue valve   . S/P Maze operation for atrial fibrillation 04/12/2013   Complete bilateral atrial lesion set using bipolar radiofrequency and cryothermy ablation with clipping of LA appendage  . Sleep apnea    Pt says testing was positive, intolerant of CPAP.  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  . Stroke ([place])   . Type II diabetes mellitus [facility])    Assessment: 74 y/o F with chief complaint of bilateral lower extremity swelling, on warfarin PTA for hx Afib/CVA.  Warfarin regimen per outpatient anti-coag notes: 2.5 mg on Mondays, 5 mg all other days  INR remains therapeutic at 2.7, no reports of bleeding. INR did increase slightly today from 2.5 to 2.7. Will adjust warfarin slightly from PTA regimen so patient does not become supratherapeutic.   Goal of Therapy:  INR 2-3 Monitor platelets by anticoagulation protocol: Yes   Plan:  Give 2.5mg  x 1 Daily INR, s/s  bleeding  Sherren Kerns, PharmD PGY1 Acute Care Pharmacy Resident Please check AMION for all [place] numbers 10/07/2019 10:24 AM

## 2019-10-07 NOTE — Progress Notes (Addendum)
[facility] Progress Note  Name: [name] MRN: [id number] DOB: September 07, 1945  Chief Complaint:  Leg swelling  Subjective:  Feeling better, weight down 1 kg on Lasix 120 IV BID and metolazone daily.  Eating lunch- no complaints.   Intake/Output Summary (Last 24 hours) at 10/07/2019 1259 Last data filed at 10/06/2019 2104 Gross per 24 hour  Intake 480 ml  Output 1550 ml  Net -1070 ml    Vitals:  Vitals:   10/06/19 2049 10/07/19 0539 10/07/19 0917 10/07/19 1156  BP: (!) 111/40 (!) 165/64 (!) 152/65 (!) 144/101  Pulse: 99 64 73 (!) 105  Resp: 20 20  20   Temp: 98.7 F (37.1 C) 97.8 F (36.6 C) 97.7 F (36.5 C)   TempSrc: Oral Oral Oral   SpO2: 97% 99% 98% 96%  Weight:  81.2 kg    Height:         Physical Exam:  General NAD, sitting up eating lunch HEENT normocephalic atraumatic extraocular movements intact sclera anicteric Neck supple trachea midline Lungs very faint crackles this AM Heart regular rate and rhythm no rubs or gallops appreciated Abdomen soft nontender nondistended; allograft nontender Extremities 1+ edema lower extremities and LUE  Psych normal mood and affect LUE AVF with thrill/bruit   Medications reviewed     Labs:  BMP Latest Ref Rng & Units 10/07/2019 10/06/2019 10/05/2019  Glucose 70 - 99 mg/dL 147(H) 72 174(H)  BUN 8 - 23 mg/dL 68(H) 72(H) 71(H)  Creatinine 0.44 - 1.00 mg/dL 2.84(H) 2.81(H) 3.00(H)  BUN/Creat Ratio 12 - 28 - - -  Sodium 135 - 145 mmol/L 142 141 140  Potassium 3.5 - 5.1 mmol/L 3.5 3.9 4.0  Chloride 98 - 111 mmol/L 101 105 103  CO2 22 - 32 mmol/L 27 23 24   Calcium 8.9 - 10.3 mg/dL 9.9 9.5 9.4     Assessment/Plan:   # Acute on chronic diastolic CHF  - Lasix 929 IV  BID and metolazone per cardiology, continue - on supp oxygen  - duplex negative  # CKD stage IV previous baseline 1.9ish, now up to 2.8 on this admission - 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; 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   # Renal transplant  - on immunosuppression as below  - s/p eval with [facility] per primary nephrologist and not felt to need transplant biopsy at that time esp with history of lung Ca; A1cs 9.0, 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  - on supplemental oxygen   # Anemia - normocytic - mild at present    Madelon Lips, MD 10/07/2019 12:59 PM

## 2019-10-07 NOTE — Progress Notes (Signed)
[facility]  PROGRESS NOTE  [name] QIO:[id number] DOB: 1945/11/10 DOA: 10/01/2019 PCP: [name], MD   Brief HPI:   74 year old female with a history of ESRD s/p cadaveric transplant in 2012, CAD with RCA stent in the past, hypertension admitted with shortness of breath, generalized weakness.  BNP found to be elevated 2000, creatinine was up from baseline 1.8-2.9.  Patient mated with decompensated diastolic heart failure.  Nephrology was consulted for help with diuresis considering patient's history of cadaveric transplant 2012, CKD stage IIIb 4.    Subjective   Patient seen and examined, diuresing well with IV Lasix.   Assessment/Plan:     1. Acute on chronic diastolic CHF-patient started on IV Lasix, Lasix dose increased to 120 mg IV every 12 hours.  Metolazone added by cardiology.  Appreciate cardiology input. 2. Left arm swelling-nephrology ordered left upper extremity venous duplex which is negative for DVT. 3. S/p renal transplant/CKD stage IIIb/IV-s/p cadaveric transplant in 2012, continue prednisone, dapsone, Prograf.  Creatinine slowly improving, today creatinine is stable at 2.84.   Nephrology following.   4. Paroxysmal atrial fibrillation-continue warfarin, metoprolol 5. ?  UTI-urine culture only growing insignificant growth.  IV ceftriaxone was discontinued.   6. Hypertension-blood pressure stable, continue metoprolol. 7. Diabetes mellitus type 2-continue sliding scale insulin NovoLog, Lantus. 8. Chronic hypoxic respiratory failure-patient has history of lung cancer treated with radiation treatment only.  Continue supplemental oxygen.    SpO2: 96 % O2 Flow Rate (L/min): 2 L/min   COVID-19 Labs  No results for input(s): DDIMER, FERRITIN, LDH, CRP in the last 72 hours.  Lab Results  Component Value Date   [place] NEGATIVE 10/01/2019   [place] NEGATIVE 12/19/2018     CBG: Recent Labs  Lab 10/06/19 1307 10/06/19 1642 10/06/19 2051  10/07/19 0640 10/07/19 1150  GLUCAP 150* 230* 232* 126* 77    CBC: Recent Labs  Lab 10/01/19 1724 10/03/19 0609  WBC 6.6 7.8  NEUTROABS  --  6.2  HGB 11.5* 10.9*  HCT 39.8 37.1  MCV 87.1 86.1  PLT 237 324    Basic Metabolic Panel: Recent Labs  Lab 10/01/19 1724 10/02/19 0301 10/03/19 0635 10/04/19 0514 10/05/19 0502 10/06/19 0337 10/07/19 0409  NA 138   < > 141 141 140 141 142  K 3.7   < > 3.3* 4.5 4.0 3.9 3.5  CL 101   < > 101 103 103 105 101  CO2 23   < > 27 26 24 23 27   GLUCOSE 326*   < > 107* 124* 174* 72 147*  BUN 69*   < > 64* 68* 71* 72* 68*  CREATININE 2.92*   < > 2.85* 3.08* 3.00* 2.81* 2.84*  CALCIUM 9.4   < > 9.2 9.4 9.4 9.5 9.9  MG 2.2  --   --   --   --   --   --   PHOS  --   --  4.7*  --   --   --   --    < > = values in this interval not displayed.     Liver Function Tests: Recent Labs  Lab 10/03/19 0635  ALBUMIN 2.4*        DVT prophylaxis: Warfarin  Code Status: Full code  Family Communication: No family at bedside  Disposition Plan: 74 year old female admitted with diastolic heart failure, has CKD stage IIIb 4.  Both cardiology and nephrology have been consulted.  Barrier to discharge- started on IV Lasix for diuresis.  Scheduled medications:  . Chlorhexidine Gluconate Cloth  6 each Topical Daily  . dapsone  25 mg Oral Daily  . insulin aspart  0-15 Units Subcutaneous TID WC  . insulin aspart  0-5 Units Subcutaneous QHS  . insulin glargine  30 Units Subcutaneous Daily  . levothyroxine  50 mcg Oral QAC breakfast  . metoprolol tartrate  12.5 mg Oral BID  . potassium chloride  20 mEq Oral Daily  . predniSONE  5 mg Oral Q breakfast  . rosuvastatin  20 mg Oral QODAY  . sodium chloride flush  3 mL Intravenous Q12H  . tacrolimus  3 mg Oral BID  . warfarin  2.5 mg Oral ONCE-1800  . Warfarin - Pharmacist Dosing Inpatient   Does not apply q1800    Consultants: Nephrology  Procedures:    Antibiotics:   Anti-infectives  (From admission, onward)   Start     Dose/Rate Route Frequency Ordered Stop   10/02/19 2200  cefTRIAXone (ROCEPHIN) 1 g in sodium chloride 0.9 % 100 mL IVPB  Status:  Discontinued     1 g 200 mL/hr over 30 Minutes Intravenous Every 24 hours 10/01/19 2314 10/03/19 1910   10/02/19 1000  dapsone tablet 25 mg     25 mg Oral Daily 10/01/19 2318     10/01/19 2315  cefTRIAXone (ROCEPHIN) 1 g in sodium chloride 0.9 % 100 mL IVPB     1 g 200 mL/hr over 30 Minutes Intravenous  Once 10/01/19 2301 10/01/19 2350       Objective   Vitals:   10/06/19 2049 10/07/19 0539 10/07/19 0917 10/07/19 1156  BP: (!) 111/40 (!) 165/64 (!) 152/65 (!) 144/101  Pulse: 99 64 73 (!) 105  Resp: 20 20  20   Temp: 98.7 F (37.1 C) 97.8 F (36.6 C) 97.7 F (36.5 C)   TempSrc: Oral Oral Oral   SpO2: 97% 99% 98% 96%  Weight:  81.2 kg    Height:        Intake/Output Summary (Last 24 hours) at 10/07/2019 1335 Last data filed at 10/06/2019 2104 Gross per 24 hour  Intake 240 ml  Output 1100 ml  Net -860 ml    03/19 1901 - 03/21 0700 In: 777.2 [P.O.:720; I.V.:7.2] Out: 2550 [Urine:2550]  Filed Weights   10/05/19 0428 10/06/19 0428 10/07/19 0539  Weight: 82.7 kg 82.2 kg 81.2 kg    Physical Examination:   General-appears in no acute distress Heart-S1-S2, regular, no murmur auscultated Lungs-clear to auscultation bilaterally, no wheezing or crackles auscultated Abdomen-soft, nontender, no organomegaly Extremities-trace edema in the lower extremities.  Left arm swelling noted. Neuro-alert, oriented x3, no focal deficit noted  Data Reviewed:   Recent Results (from the past 240 hour(s))  SARS CORONAVIRUS 2 (TAT 6-24 HRS) Nasopharyngeal Nasopharyngeal Swab     Status: None   Collection Time: 10/01/19  9:29 PM   Specimen: Nasopharyngeal Swab  Result Value Ref Range Status   SARS Coronavirus 2 NEGATIVE NEGATIVE Final    Comment: (NOTE) SARS-CoV-2 target nucleic acids are NOT DETECTED. The SARS-CoV-2 RNA  is generally detectable in upper and lower respiratory specimens during the acute phase of infection. Negative results do not preclude SARS-CoV-2 infection, do not rule out co-infections with other pathogens, and should not be used as the sole basis for treatment or other patient management decisions. Negative results must be combined with clinical observations, patient history, and epidemiological information. The expected result is Negative. Fact Sheet for Patients: SugarRoll.be Fact Sheet for Healthcare Providers:  https://www.woods-mathews.com/ This test is not yet approved or cleared by the [place] and  has been authorized for detection and/or diagnosis of SARS-CoV-2 by FDA under an Emergency Use Authorization (EUA). This EUA will remain  in effect (meaning this test can be used) for the duration of the COVID-19 declaration under Section 56 4(b)(1) of the Act, 21 U.S.C. section 360bbb-3(b)(1), unless the authorization is terminated or revoked sooner. Performed at [facility], [place] [address] [place], [place] [address]   Culture, Urine     Status: Abnormal   Collection Time: 10/02/19 12:25 AM   Specimen: Urine, Clean Catch  Result Value Ref Range Status   Specimen Description URINE, CLEAN CATCH  Final   Special Requests NONE  Final   Culture (A)  Final    <10,000 COLONIES/mL INSIGNIFICANT GROWTH Performed at [facility], [place] [address], [place], [place] [address]    Report Status 10/03/2019 FINAL  Final    No results for input(s): LIPASE, AMYLASE in the last 168 hours. No results for input(s): AMMONIA in the last 168 hours.  Cardiac Enzymes: No results for input(s): CKTOTAL, CKMB, CKMBINDEX, TROPONINI in the last 168 hours. BNP (last 3 results) Recent Labs    12/19/18 1621 12/23/18 0444 10/01/19 1724  BNP 1,637.0* 1,655.2* 2,092.5*       Admission status: The appropriate admission status for this  patient is INPATIENT. Inpatient status is judged to be reasonable and necessary in order to provide the required intensity of service to ensure the patient's safety. The patient's presenting symptoms, physical exam findings, and initial radiographic and laboratory data in the context of their chronic comorbidities is felt to place them at high risk for further clinical deterioration. Furthermore, it is not anticipated that the patient will be medically stable for discharge from the hospital within 2 midnights of admission. The following factors support the admission status of inpatient.  The patient's presenting symptoms include.  Bilateral lower extremity edema, left upper extremity swelling The worrisome physical exam findings include bilateral lower extremity edema The initial radiographic and laboratory data are worrisome because of CHF exacerbation The chronic co-morbidities include CKD stage IIIb/IV   * I certify that at the point of admission it is my clinical judgment that the patient will require inpatient hospital care spanning beyond 2 midnights from the point of admission due to high intensity of service, high risk for further deterioration and high frequency of surveillance required.Oswald Hillock   [facility] If 7PM-7AM, please contact night-coverage at [email or web address], Office  [phone]   10/07/2019, 1:35 PM  LOS: 5 days            [facility]  PROGRESS NOTE  [name] [id number]:[id number] DOB: 20-Jun-1946 DOA: 10/01/2019 PCP: [name], MD   Brief HPI:   74 year old female with a history of ESRD s/p cadaveric transplant in 2012, CAD with RCA stent in the past, hypertension admitted with shortness of breath, generalized weakness.  BNP found to be elevated 2000, creatinine was up from baseline 1.8-2.9.  Patient mated with decompensated diastolic heart failure.  Nephrology was consulted for help with diuresis considering patient's history of  cadaveric transplant 2012, CKD stage IIIb 4.    Subjective   Patient seen and examined, leg swelling has improved.  Denies shortness of breath.   Assessment/Plan:     9. Acute on chronic diastolic CHF-patient started on IV Lasix, Lasix dose increased to 120 mg IV every 12 hours.  Metolazone added by cardiology.  Appreciate cardiology input. 10. Left arm swelling-nephrology ordered left upper extremity venous duplex which is negative for DVT. 11. S/p renal transplant/CKD stage IIIb/IV-s/p cadaveric transplant in 2012, continue prednisone, dapsone, Prograf.  Creatinine slowly improving, today creatinine 2.81.  Nephrology following.   12. Paroxysmal atrial fibrillation-continue warfarin, metoprolol 13. ?  UTI-urine culture only growing insignificant growth.  IV ceftriaxone was discontinued.   14. Hypertension-blood pressure stable, continue metoprolol. 15. Diabetes mellitus type 2-continue sliding scale insulin NovoLog, Lantus. 16. Chronic hypoxic respiratory failure-continue supplemental oxygen.    SpO2: 96 % O2 Flow Rate (L/min): 2 L/min   COVID-19 Labs  No results for input(s): DDIMER, FERRITIN, LDH, CRP in the last 72 hours.  Lab Results  Component Value Date   [place] NEGATIVE 10/01/2019   [place] NEGATIVE 12/19/2018     CBG: Recent Labs  Lab 10/06/19 1307 10/06/19 1642 10/06/19 2051 10/07/19 0640 10/07/19 1150  GLUCAP 150* 230* 232* 126* 77    CBC: Recent Labs  Lab 10/01/19 1724 10/03/19 0609  WBC 6.6 7.8  NEUTROABS  --  6.2  HGB 11.5* 10.9*  HCT 39.8 37.1  MCV 87.1 86.1  PLT 237 824    Basic Metabolic Panel: Recent Labs  Lab 10/01/19 1724 10/02/19 0301 10/03/19 0635 10/04/19 0514 10/05/19 0502 10/06/19 0337 10/07/19 0409  NA 138   < > 141 141 140 141 142  K 3.7   < > 3.3* 4.5 4.0 3.9 3.5  CL 101   < > 101 103 103 105 101  CO2 23   < > 27 26 24 23 27   GLUCOSE 326*   < > 107* 124* 174* 72 147*  BUN 69*   < > 64* 68* 71* 72* 68*   CREATININE 2.92*   < > 2.85* 3.08* 3.00* 2.81* 2.84*  CALCIUM 9.4   < > 9.2 9.4 9.4 9.5 9.9  MG 2.2  --   --   --   --   --   --   PHOS  --   --  4.7*  --   --   --   --    < > = values in this interval not displayed.     Liver Function Tests: Recent Labs  Lab 10/03/19 0635  ALBUMIN 2.4*        DVT prophylaxis: Warfarin  Code Status: Full code  Family Communication: No family at bedside  Disposition Plan: 74 year old female admitted with diastolic heart failure, has CKD stage IIIb 4.  Both cardiology and nephrology have been consulted.  Barrier to discharge- started on IV Lasix for diuresis.         Scheduled medications:  . Chlorhexidine Gluconate Cloth  6 each Topical Daily  . dapsone  25 mg Oral Daily  . insulin aspart  0-15 Units Subcutaneous TID WC  . insulin aspart  0-5 Units Subcutaneous QHS  . insulin glargine  30 Units Subcutaneous Daily  . levothyroxine  50 mcg Oral QAC breakfast  . metoprolol tartrate  12.5 mg Oral BID  . potassium chloride  20 mEq Oral Daily  . predniSONE  5 mg Oral Q breakfast  . rosuvastatin  20 mg Oral QODAY  . sodium chloride flush  3 mL Intravenous Q12H  . tacrolimus  3 mg Oral BID  . warfarin  2.5 mg Oral ONCE-1800  . Warfarin - Pharmacist Dosing Inpatient   Does not apply q1800    Consultants: Nephrology  Procedures:    Antibiotics:  Anti-infectives (From admission, onward)   Start     Dose/Rate Route Frequency Ordered Stop   10/02/19 2200  cefTRIAXone (ROCEPHIN) 1 g in sodium chloride 0.9 % 100 mL IVPB  Status:  Discontinued     1 g 200 mL/hr over 30 Minutes Intravenous Every 24 hours 10/01/19 2314 10/03/19 1910   10/02/19 1000  dapsone tablet 25 mg     25 mg Oral Daily 10/01/19 2318     10/01/19 2315  cefTRIAXone (ROCEPHIN) 1 g in sodium chloride 0.9 % 100 mL IVPB     1 g 200 mL/hr over 30 Minutes Intravenous  Once 10/01/19 2301 10/01/19 2350       Objective   Vitals:   10/06/19 2049 10/07/19 0539  10/07/19 0917 10/07/19 1156  BP: (!) 111/40 (!) 165/64 (!) 152/65 (!) 144/101  Pulse: 99 64 73 (!) 105  Resp: 20 20  20   Temp: 98.7 F (37.1 C) 97.8 F (36.6 C) 97.7 F (36.5 C)   TempSrc: Oral Oral Oral   SpO2: 97% 99% 98% 96%  Weight:  81.2 kg    Height:        Intake/Output Summary (Last 24 hours) at 10/07/2019 1335 Last data filed at 10/06/2019 2104 Gross per 24 hour  Intake 240 ml  Output 1100 ml  Net -860 ml    03/19 1901 - 03/21 0700 In: 777.2 [P.O.:720; I.V.:7.2] Out: 2550 [Urine:2550]  Filed Weights   10/05/19 0428 10/06/19 0428 10/07/19 0539  Weight: 82.7 kg 82.2 kg 81.2 kg    Physical Examination:   General-appears in no acute distress Heart-S1-S2, regular, no murmur auscultated Lungs-clear to auscultation bilaterally, no wheezing or crackles auscultated Abdomen-soft, nontender, no organomegaly Extremities-trace edema of the lower extremities noted.  Left upper extremity is edematous. Neuro-alert, oriented x3, no focal deficit noted  Data Reviewed:   Recent Results (from the past 240 hour(s))  SARS CORONAVIRUS 2 (TAT 6-24 HRS) Nasopharyngeal Nasopharyngeal Swab     Status: None   Collection Time: 10/01/19  9:29 PM   Specimen: Nasopharyngeal Swab  Result Value Ref Range Status   SARS Coronavirus 2 NEGATIVE NEGATIVE Final    Comment: (NOTE) SARS-CoV-2 target nucleic acids are NOT DETECTED. The SARS-CoV-2 RNA is generally detectable in upper and lower respiratory specimens during the acute phase of infection. Negative results do not preclude SARS-CoV-2 infection, do not rule out co-infections with other pathogens, and should not be used as the sole basis for treatment or other patient management decisions. Negative results must be combined with clinical observations, patient history, and epidemiological information. The expected result is Negative. Fact Sheet for Patients: SugarRoll.be Fact Sheet for Healthcare  Providers: https://www.woods-mathews.com/ This test is not yet approved or cleared by the [place] FDA and  has been authorized for detection and/or diagnosis of SARS-CoV-2 by FDA under an Emergency Use Authorization (EUA). This EUA will remain  in effect (meaning this test can be used) for the duration of the COVID-19 declaration under Section 56 4(b)(1) of the Act, 21 U.S.C. section 360bbb-3(b)(1), unless the authorization is terminated or revoked sooner. Performed at [facility], [place] [address] [place],  [address]   Culture, Urine     Status: Abnormal   Collection Time: 10/02/19 12:25 AM   Specimen: Urine, Clean Catch  Result Value Ref Range Status   Specimen Description URINE, CLEAN CATCH  Final   Special Requests NONE  Final   Culture (A)  Final    <10,000 COLONIES/mL INSIGNIFICANT  GROWTH Performed at [facility], [place] [address] [place], [place] [address]    Report Status 10/03/2019 FINAL  Final    No results for input(s): LIPASE, AMYLASE in the last 168 hours. No results for input(s): AMMONIA in the last 168 hours.  Cardiac Enzymes: No results for input(s): CKTOTAL, CKMB, CKMBINDEX, TROPONINI in the last 168 hours. BNP (last 3 results) Recent Labs    12/19/18 1621 12/23/18 0444 10/01/19 1724  BNP 1,637.0* 1,655.2* 2,092.5*       Admission status: The appropriate admission status for this patient is INPATIENT. Inpatient status is judged to be reasonable and necessary in order to provide the required intensity of service to ensure the patient's safety. The patient's presenting symptoms, physical exam findings, and initial radiographic and laboratory data in the context of their chronic comorbidities is felt to place them at high risk for further clinical deterioration. Furthermore, it is not anticipated that the patient will be medically stable for discharge from the hospital within 2 midnights of admission. The following factors  support the admission status of inpatient.  The patient's presenting symptoms include.  Bilateral lower extremity edema, left upper extremity swelling The worrisome physical exam findings include bilateral lower extremity edema The initial radiographic and laboratory data are worrisome because of CHF exacerbation The chronic co-morbidities include CKD stage IIIb/IV   * I certify that at the point of admission it is my clinical judgment that the patient will require inpatient hospital care spanning beyond 2 midnights from the point of admission due to high intensity of service, high risk for further deterioration and high frequency of surveillance required.Oswald Hillock   [facility] If 7PM-7AM, please contact night-coverage at [email or web address], Office  [phone]   10/07/2019, 1:35 PM  LOS: 5 days

## 2019-10-07 NOTE — Progress Notes (Signed)
Progress Note  Patient Name: [name] Date of Encounter: 10/07/2019  Primary Cardiologist: [name], MD   Subjective   Pt urinating all night   No CP   Breathing is better than admit    Inpatient Medications    Scheduled Meds: . Chlorhexidine Gluconate Cloth  6 each Topical Daily  . dapsone  25 mg Oral Daily  . insulin aspart  0-15 Units Subcutaneous TID WC  . insulin aspart  0-5 Units Subcutaneous QHS  . insulin glargine  30 Units Subcutaneous Daily  . levothyroxine  50 mcg Oral QAC breakfast  . metoprolol tartrate  12.5 mg Oral BID  . predniSONE  5 mg Oral Q breakfast  . rosuvastatin  20 mg Oral QODAY  . sodium chloride flush  3 mL Intravenous Q12H  . tacrolimus  3 mg Oral BID  . [START ON 10/08/2019] warfarin  2.5 mg Oral Once per day on Mon  . warfarin  5 mg Oral Once per day on Sun Tue Wed Thu Fri Sat  . Warfarin - Pharmacist Dosing Inpatient   Does not apply q1800   Continuous Infusions: . sodium chloride Stopped (10/02/19 2324)  . furosemide 120 mg (10/06/19 1822)   PRN Meds: sodium chloride, acetaminophen, bisacodyl, HYDROcodone-acetaminophen, ondansetron (ZOFRAN) IV, polyethylene glycol, polyvinyl alcohol, sodium chloride flush   Vital Signs    Vitals:   10/06/19 0439 10/06/19 1140 10/06/19 2049 10/07/19 0539  BP: (!) 150/66 (!) 166/73 (!) 111/40 (!) 165/64  Pulse: 78 (!) 56 99 64  Resp: 20 18 20 20   Temp: 99 F (37.2 C) 97.8 F (36.6 C) 98.7 F (37.1 C) 97.8 F (36.6 C)  TempSrc: Oral Oral Oral Oral  SpO2: 98% 98% 97% 99%  Weight:    81.2 kg  Height:        Intake/Output Summary (Last 24 hours) at 10/07/2019 0659 Last data filed at 10/06/2019 2104 Gross per 24 hour  Intake 720 ml  Output 1550 ml  Net -830 ml   Net NEg 3.4 L    Last 3 Weights 10/07/2019 10/06/2019 10/05/2019  Weight (lbs) 179 lb 1.6 oz 181 lb 3.2 oz 182 lb 6.4 oz  Weight (kg) 81.239 kg 82.192 kg 82.736 kg      Telemetry    SR    Personally Reviewed   Physical  Exam   GEN: No acute distress.  WD WN Neck  JVP increased   Cardiac: RRR 2/6 systolic murmur Respiratory: MOving air   GI: Soft, NT/ND MS: tr tp 1+ edema lower ext and LUE Neuro:  Grossly intact Psych: Normal affect   Labs     Chemistry Recent Labs  Lab 10/03/19 0635 10/04/19 0514 10/05/19 0502 10/06/19 0337 10/07/19 0409  NA 141   < > 140 141 142  K 3.3*   < > 4.0 3.9 3.5  CL 101   < > 103 105 101  CO2 27   < > 24 23 27   GLUCOSE 107*   < > 174* 72 147*  BUN 64*   < > 71* 72* 68*  CREATININE 2.85*   < > 3.00* 2.81* 2.84*  CALCIUM 9.2   < > 9.4 9.5 9.9  ALBUMIN 2.4*  --   --   --   --   GFRNONAA 16*   < > 15* 16* 16*  GFRAA 18*   < > 17* 18* 18*  ANIONGAP 13   < > 13 13 14    < > = values  in this interval not displayed.     Hematology Recent Labs  Lab 10/01/19 1724 10/03/19 0609  WBC 6.6 7.8  RBC 4.57 4.31  HGB 11.5* 10.9*  HCT 39.8 37.1  MCV 87.1 86.1  MCH 25.2* 25.3*  MCHC 28.9* 29.4*  RDW 20.7* 20.2*  PLT 237 249    BNP Recent Labs  Lab 10/01/19 1724  BNP 2,092.5*     Radiology    No results found.  Patient Profile     74 y.o. female with past medical history of aortic stenosis status post aortic valve replacement, atrial fibrillation, prior embolic CVA, coronary artery disease, hypertension, chronic diastolic congestive heart failure, hyperlipidemia, prior renal transplant, lung cancer for evaluation of acute on chronic diastolic congestive heart failure.  Echocardiogram this admission shows normal LV function, mild left ventricular hypertrophy, severe pulmonary hypertension, biatrial enlargement, mild to moderate mitral and tricuspid regurgitation, prior aortic valve replacement with mean gradient 13 mmHg and trace aortic insufficiency.    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  4 paroxysmal atrial fibrillation-Keep on anticoagulation   Curr in SR    5 history of coronary artery disease-no chest pain.  Continue statin.  No aspirin given need for Coumadin.  6  HTN  BP labile  Follow as diurese.   For questions or updates, please contact [place] Please consult [email or web address] for contact info under        Signed, Dorris Carnes, MD  10/07/2019, 6:59 AM

## 2019-10-08 ENCOUNTER — Ambulatory Visit: Payer: Self-pay | Admitting: *Deleted

## 2019-10-08 DIAGNOSIS — Z953 Presence of xenogenic heart valve: Secondary | ICD-10-CM

## 2019-10-08 DIAGNOSIS — I272 Pulmonary hypertension, unspecified: Secondary | ICD-10-CM

## 2019-10-08 LAB — CBC
HCT: 36 % (ref 36.0–46.0)
Hemoglobin: 10.7 g/dL — ABNORMAL LOW (ref 12.0–15.0)
MCH: 25.4 pg — ABNORMAL LOW (ref 26.0–34.0)
MCHC: 29.7 g/dL — ABNORMAL LOW (ref 30.0–36.0)
MCV: 85.3 fL (ref 80.0–100.0)
Platelets: 226 10*3/uL (ref 150–400)
RBC: 4.22 MIL/uL (ref 3.87–5.11)
RDW: 19.5 % — ABNORMAL HIGH (ref 11.5–15.5)
WBC: 6.5 10*3/uL (ref 4.0–10.5)
nRBC: 0 % (ref 0.0–0.2)

## 2019-10-08 LAB — GLUCOSE, CAPILLARY
Glucose-Capillary: 157 mg/dL — ABNORMAL HIGH (ref 70–99)
Glucose-Capillary: 143 mg/dL — ABNORMAL HIGH (ref 70–99)
Glucose-Capillary: 129 mg/dL — ABNORMAL HIGH (ref 70–99)
Glucose-Capillary: 134 mg/dL — ABNORMAL HIGH (ref 70–99)

## 2019-10-08 LAB — URINALYSIS, ROUTINE W REFLEX MICROSCOPIC
Bacteria, UA: NONE SEEN
Bilirubin Urine: NEGATIVE
Glucose, UA: NEGATIVE mg/dL
Ketones, ur: NEGATIVE mg/dL
Leukocytes,Ua: NEGATIVE
Nitrite: NEGATIVE
Protein, ur: 100 mg/dL — AB
Specific Gravity, Urine: 1.006 (ref 1.005–1.030)
pH: 6 (ref 5.0–8.0)

## 2019-10-08 LAB — BASIC METABOLIC PANEL
Anion gap: 13 (ref 5–15)
BUN: 75 mg/dL — ABNORMAL HIGH (ref 8–23)
CO2: 28 mmol/L (ref 22–32)
Calcium: 9.5 mg/dL (ref 8.9–10.3)
Chloride: 99 mmol/L (ref 98–111)
Creatinine, Ser: 3.17 mg/dL — ABNORMAL HIGH (ref 0.44–1.00)
GFR calc Af Amer: 16 mL/min — ABNORMAL LOW (ref >60)
GFR calc non Af Amer: 14 mL/min — ABNORMAL LOW (ref >60)
Glucose, Bld: 168 mg/dL — ABNORMAL HIGH (ref 70–99)
Potassium: 3.6 mmol/L (ref 3.5–5.1)
Sodium: 140 mmol/L (ref 135–145)

## 2019-10-08 LAB — TACROLIMUS LEVEL: Tacrolimus (FK506) - LabCorp: 5.4 ng/mL (ref 2.0–20.0)

## 2019-10-08 LAB — PROTIME-INR
INR: 3.2 — ABNORMAL HIGH (ref 0.8–1.2)
Prothrombin Time: 32.4 seconds — ABNORMAL HIGH (ref 11.4–15.2)

## 2019-10-08 MED ORDER — WARFARIN - PHARMACIST DOSING INPATIENT
Freq: Every day | Status: DC
  Administered 2019-10-10: 1

## 2019-10-08 MED ORDER — WARFARIN SODIUM 2 MG PO TABS
2.0000 mg | ORAL_TABLET | Freq: Once | ORAL | Status: AC
  Administered 2019-10-08: 2 mg via ORAL
  Filled 2019-10-08: qty 1

## 2019-10-08 NOTE — Progress Notes (Signed)
[facility]  PROGRESS NOTE  [name] GMW:[id number] DOB: 30-Nov-1945 DOA: 10/01/2019 PCP: [name], MD   Brief HPI:   74 year old female with a history of ESRD s/p cadaveric transplant in 2012, CAD with RCA stent in the past, hypertension admitted with shortness of breath, generalized weakness.  BNP found to be elevated 2000, creatinine was up from baseline 1.8-2.9.  Patient mated with decompensated diastolic heart failure.  Nephrology was consulted for help with diuresis considering patient's history of cadaveric transplant 2012, CKD stage IIIb 4.    Subjective   Patient seen and examined, denies shortness of breath.  Diuretics on hold due to worsening renal function.   Assessment/Plan:     1. Acute on chronic diastolic CHF-patient was started on IV Lasix, Lasix dose increased to 120 mg IV every 12 hours.  Metolazone added by cardiology.  At this time diuretics is currently on hold due to worsening renal function.  Today creatinine is 3.17.  Cardiology recommends to start torsemide from tomorrow morning if creatinine remains stable. 2. Left arm swelling-nephrology ordered left upper extremity venous duplex which is negative for DVT. 3. S/p renal transplant/CKD stage IIIb/IV-s/p cadaveric transplant in 2012, continue prednisone, dapsone, Prograf.  Creatinine slowly improving, today creatinine is stable at 2.84.   Nephrology following.   4. Paroxysmal atrial fibrillation-continue warfarin, metoprolol 5. ?  UTI-urine culture only growing insignificant growth.  IV ceftriaxone was discontinued.   6. Hypertension-blood pressure stable, continue metoprolol. 7. Diabetes mellitus type 2-continue sliding scale insulin NovoLog, Lantus. 8. Chronic hypoxic respiratory failure-patient has history of lung cancer treated with radiation treatment only.  Continue supplemental oxygen.    SpO2: 90 % O2 Flow Rate (L/min): 2 L/min   COVID-19 Labs  No results for input(s): DDIMER,  FERRITIN, LDH, CRP in the last 72 hours.  Lab Results  Component Value Date   SARSCOV2NAA NEGATIVE 10/01/2019   [place] NEGATIVE 12/19/2018     CBG: Recent Labs  Lab 10/07/19 1150 10/07/19 1614 10/07/19 2134 10/08/19 0627 10/08/19 1116  GLUCAP 77 178* 268* 157* 143*    CBC: Recent Labs  Lab 10/01/19 1724 10/03/19 0609 10/08/19 0508  WBC 6.6 7.8 6.5  NEUTROABS  --  6.2  --   HGB 11.5* 10.9* 10.7*  HCT 39.8 37.1 36.0  MCV 87.1 86.1 85.3  PLT 237 249 440    Basic Metabolic Panel: Recent Labs  Lab 10/01/19 1724 10/02/19 0301 10/03/19 3474 10/03/19 2595 10/04/19 0514 10/05/19 0502 10/06/19 0337 10/07/19 0409 10/08/19 0508  NA 138   < > 141   < > 141 140 141 142 140  K 3.7   < > 3.3*   < > 4.5 4.0 3.9 3.5 3.6  CL 101   < > 101   < > 103 103 105 101 99  CO2 23   < > 27   < > 26 24 23 27 28   GLUCOSE 326*   < > 107*   < > 124* 174* 72 147* 168*  BUN 69*   < > 64*   < > 68* 71* 72* 68* 75*  CREATININE 2.92*   < > 2.85*   < > 3.08* 3.00* 2.81* 2.84* 3.17*  CALCIUM 9.4   < > 9.2   < > 9.4 9.4 9.5 9.9 9.5  MG 2.2  --   --   --   --   --   --   --   --   PHOS  --   --  4.7*  --   --   --   --   --   --    < > = values in this interval not displayed.     Liver Function Tests: Recent Labs  Lab 10/03/19 0635  ALBUMIN 2.4*        DVT prophylaxis: Warfarin  Code Status: Full code  Family Communication: No family at bedside  Disposition Plan: 74 year old female admitted with diastolic heart failure, has CKD stage IIIb 4.  Both cardiology and nephrology have been consulted.  Barrier to discharge- started on IV Lasix for diuresis, now has worsening renal function.    Scheduled medications:  . Chlorhexidine Gluconate Cloth  6 each Topical Daily  . dapsone  25 mg Oral Daily  . insulin aspart  0-15 Units Subcutaneous TID WC  . insulin aspart  0-5 Units Subcutaneous QHS  . insulin glargine  30 Units Subcutaneous Daily  . levothyroxine  50 mcg Oral QAC  breakfast  . metoprolol tartrate  12.5 mg Oral BID  . potassium chloride  20 mEq Oral Daily  . predniSONE  5 mg Oral Q breakfast  . rosuvastatin  20 mg Oral QODAY  . sodium chloride flush  3 mL Intravenous Q12H  . tacrolimus  3 mg Oral BID  . warfarin  2 mg Oral ONCE-1600  . Warfarin - Pharmacist Dosing Inpatient   Does not apply [id number]    Consultants: Nephrology  Procedures:    Antibiotics:   Anti-infectives (From admission, onward)   Start     Dose/Rate Route Frequency Ordered Stop   10/02/19 2200  cefTRIAXone (ROCEPHIN) 1 g in sodium chloride 0.9 % 100 mL IVPB  Status:  Discontinued     1 g 200 mL/hr over 30 Minutes Intravenous Every 24 hours 10/01/19 2314 10/03/19 1910   10/02/19 1000  dapsone tablet 25 mg     25 mg Oral Daily 10/01/19 2318     10/01/19 2315  cefTRIAXone (ROCEPHIN) 1 g in sodium chloride 0.9 % 100 mL IVPB     1 g 200 mL/hr over 30 Minutes Intravenous  Once 10/01/19 2301 10/01/19 2350       Objective   Vitals:   10/07/19 1156 10/07/19 2057 10/08/19 0515 10/08/19 1151  BP: (!) 144/101 (!) 154/81 (!) 155/82 (!) 150/94  Pulse: (!) 105 64 92 93  Resp: 20 20 18 18   Temp: 98.4 F (36.9 C) 98.6 F (37 C) (!) 97.3 F (36.3 C) 98.2 F (36.8 C)  TempSrc: Oral Oral Oral Oral  SpO2: 96% 94% 91% 90%  Weight:   81.1 kg   Height:        Intake/Output Summary (Last 24 hours) at 10/08/2019 1457 Last data filed at 10/08/2019 1300 Gross per 24 hour  Intake 1280 ml  Output 2624 ml  Net -1344 ml    03/20 1901 - 03/22 0700 In: 960 [P.O.:960] Out: 3802 [Urine:3800]  Filed Weights   10/06/19 0428 10/07/19 0539 10/08/19 0515  Weight: 82.2 kg 81.2 kg 81.1 kg    Physical Examination:   General-appears in no acute distress Heart-S1-S2, regular, no murmur auscultated Lungs-clear to auscultation bilaterally, no wheezing or crackles auscultated Abdomen-soft, nontender, no organomegaly Extremities-trace edema in the lower extremities Neuro-alert, oriented  x3, no focal deficit noted  Data Reviewed:   Recent Results (from the past 240 hour(s))  SARS CORONAVIRUS 2 (TAT 6-24 HRS) Nasopharyngeal Nasopharyngeal Swab     Status: None   Collection Time: 10/01/19  9:29 PM  Specimen: Nasopharyngeal Swab  Result Value Ref Range Status   SARS Coronavirus 2 NEGATIVE NEGATIVE Final    Comment: (NOTE) SARS-CoV-2 target nucleic acids are NOT DETECTED. The SARS-CoV-2 RNA is generally detectable in upper and lower respiratory specimens during the acute phase of infection. Negative results do not preclude SARS-CoV-2 infection, do not rule out co-infections with other pathogens, and should not be used as the sole basis for treatment or other patient management decisions. Negative results must be combined with clinical observations, patient history, and epidemiological information. The expected result is Negative. Fact Sheet for Patients: SugarRoll.be Fact Sheet for Healthcare Providers: https://www.woods-mathews.com/ This test is not yet approved or cleared by the [place] FDA and  has been authorized for detection and/or diagnosis of SARS-CoV-2 by FDA under an Emergency Use Authorization (EUA). This EUA will remain  in effect (meaning this test can be used) for the duration of the COVID-19 declaration under Section 56 4(b)(1) of the Act, 21 U.S.C. section 360bbb-3(b)(1), unless the authorization is terminated or revoked sooner. Performed at [facility], [place] [address], [place], [place] [address]   Culture, Urine     Status: Abnormal   Collection Time: 10/02/19 12:25 AM   Specimen: Urine, Clean Catch  Result Value Ref Range Status   Specimen Description URINE, CLEAN CATCH  Final   Special Requests NONE  Final   Culture (A)  Final    <10,000 COLONIES/mL INSIGNIFICANT GROWTH Performed at [facility], [place] [address], [place], [place] [address]    Report Status 10/03/2019 FINAL  Final     No results for input(s): LIPASE, AMYLASE in the last 168 hours. No results for input(s): AMMONIA in the last 168 hours.  Cardiac Enzymes: No results for input(s): CKTOTAL, CKMB, CKMBINDEX, TROPONINI in the last 168 hours. BNP (last 3 results) Recent Labs    12/19/18 1621 12/23/18 0444 10/01/19 1724  BNP 1,637.0* 1,655.2* 2,092.5*       Admission status: The appropriate admission status for this patient is INPATIENT. Inpatient status is judged to be reasonable and necessary in order to provide the required intensity of service to ensure the patient's safety. The patient's presenting symptoms, physical exam findings, and initial radiographic and laboratory data in the context of their chronic comorbidities is felt to place them at high risk for further clinical deterioration. Furthermore, it is not anticipated that the patient will be medically stable for discharge from the hospital within 2 midnights of admission. The following factors support the admission status of inpatient.  The patient's presenting symptoms include.  Bilateral lower extremity edema, left upper extremity swelling The worrisome physical exam findings include bilateral lower extremity edema The initial radiographic and laboratory data are worrisome because of CHF exacerbation The chronic co-morbidities include CKD stage IIIb/IV   * I certify that at the point of admission it is my clinical judgment that the patient will require inpatient hospital care spanning beyond 2 midnights from the point of admission due to high intensity of service, high risk for further deterioration and high frequency of surveillance required.Oswald Hillock   [facility] If 7PM-7AM, please contact night-coverage at [email or web address], Office  [phone]   10/08/2019, 2:57 PM  LOS: 6 days            [facility]  PROGRESS NOTE  [name] GLO:[id number] DOB: Nov 13, 1945 DOA: 10/01/2019 PCP: [name],  MD   Brief HPI:   74 year old female with a history of ESRD  s/p cadaveric transplant in 2012, CAD with RCA stent in the past, hypertension admitted with shortness of breath, generalized weakness.  BNP found to be elevated 2000, creatinine was up from baseline 1.8-2.9.  Patient mated with decompensated diastolic heart failure.  Nephrology was consulted for help with diuresis considering patient's history of cadaveric transplant 2012, CKD stage IIIb 4.    Subjective   Patient seen and examined, leg swelling has improved.  Denies shortness of breath.   Assessment/Plan:     9. Acute on chronic diastolic CHF-patient started on IV Lasix, Lasix dose increased to 120 mg IV every 12 hours.  Metolazone added by cardiology.  Appreciate cardiology input. 10. Left arm swelling-nephrology ordered left upper extremity venous duplex which is negative for DVT. 11. S/p renal transplant/CKD stage IIIb/IV-s/p cadaveric transplant in 2012, continue prednisone, dapsone, Prograf.  Creatinine slowly improving, today creatinine 2.81.  Nephrology following.   12. Paroxysmal atrial fibrillation-continue warfarin, metoprolol 13. ?  UTI-urine culture only growing insignificant growth.  IV ceftriaxone was discontinued.   14. Hypertension-blood pressure stable, continue metoprolol. 15. Diabetes mellitus type 2-continue sliding scale insulin NovoLog, Lantus. 16. Chronic hypoxic respiratory failure-continue supplemental oxygen.    SpO2: 90 % O2 Flow Rate (L/min): 2 L/min   COVID-19 Labs  No results for input(s): DDIMER, FERRITIN, LDH, CRP in the last 72 hours.  Lab Results  Component Value Date   [place] NEGATIVE 10/01/2019   [place] NEGATIVE 12/19/2018     CBG: Recent Labs  Lab 10/07/19 1150 10/07/19 1614 10/07/19 2134 10/08/19 0627 10/08/19 1116  GLUCAP 77 178* 268* 157* 143*    CBC: Recent Labs  Lab 10/01/19 1724 10/03/19 0609 10/08/19 0508  WBC 6.6 7.8 6.5  NEUTROABS  --  6.2  --    HGB 11.5* 10.9* 10.7*  HCT 39.8 37.1 36.0  MCV 87.1 86.1 85.3  PLT 237 249 025    Basic Metabolic Panel: Recent Labs  Lab 10/01/19 1724 10/02/19 0301 10/03/19 8527 10/03/19 7824 10/04/19 0514 10/05/19 0502 10/06/19 0337 10/07/19 0409 10/08/19 0508  NA 138   < > 141   < > 141 140 141 142 140  K 3.7   < > 3.3*   < > 4.5 4.0 3.9 3.5 3.6  CL 101   < > 101   < > 103 103 105 101 99  CO2 23   < > 27   < > 26 24 23 27 28   GLUCOSE 326*   < > 107*   < > 124* 174* 72 147* 168*  BUN 69*   < > 64*   < > 68* 71* 72* 68* 75*  CREATININE 2.92*   < > 2.85*   < > 3.08* 3.00* 2.81* 2.84* 3.17*  CALCIUM 9.4   < > 9.2   < > 9.4 9.4 9.5 9.9 9.5  MG 2.2  --   --   --   --   --   --   --   --   PHOS  --   --  4.7*  --   --   --   --   --   --    < > = values in this interval not displayed.     Liver Function Tests: Recent Labs  Lab 10/03/19 0635  ALBUMIN 2.4*        DVT prophylaxis: Warfarin  Code Status: Full code  Family Communication: No family at bedside  Disposition Plan: 74 year old female admitted with diastolic heart failure,  has CKD stage IIIb 4.  Both cardiology and nephrology have been consulted.  Barrier to discharge- started on IV Lasix for diuresis.         Scheduled medications:  . Chlorhexidine Gluconate Cloth  6 each Topical Daily  . dapsone  25 mg Oral Daily  . insulin aspart  0-15 Units Subcutaneous TID WC  . insulin aspart  0-5 Units Subcutaneous QHS  . insulin glargine  30 Units Subcutaneous Daily  . levothyroxine  50 mcg Oral QAC breakfast  . metoprolol tartrate  12.5 mg Oral BID  . potassium chloride  20 mEq Oral Daily  . predniSONE  5 mg Oral Q breakfast  . rosuvastatin  20 mg Oral QODAY  . sodium chloride flush  3 mL Intravenous Q12H  . tacrolimus  3 mg Oral BID  . warfarin  2 mg Oral ONCE-1600  . Warfarin - Pharmacist Dosing Inpatient   Does not apply [id number]    Consultants: Nephrology  Procedures:    Antibiotics:   Anti-infectives  (From admission, onward)   Start     Dose/Rate Route Frequency Ordered Stop   10/02/19 2200  cefTRIAXone (ROCEPHIN) 1 g in sodium chloride 0.9 % 100 mL IVPB  Status:  Discontinued     1 g 200 mL/hr over 30 Minutes Intravenous Every 24 hours 10/01/19 2314 10/03/19 1910   10/02/19 1000  dapsone tablet 25 mg     25 mg Oral Daily 10/01/19 2318     10/01/19 2315  cefTRIAXone (ROCEPHIN) 1 g in sodium chloride 0.9 % 100 mL IVPB     1 g 200 mL/hr over 30 Minutes Intravenous  Once 10/01/19 2301 10/01/19 2350       Objective   Vitals:   10/07/19 1156 10/07/19 2057 10/08/19 0515 10/08/19 1151  BP: (!) 144/101 (!) 154/81 (!) 155/82 (!) 150/94  Pulse: (!) 105 64 92 93  Resp: 20 20 18 18   Temp: 98.4 F (36.9 C) 98.6 F (37 C) (!) 97.3 F (36.3 C) 98.2 F (36.8 C)  TempSrc: Oral Oral Oral Oral  SpO2: 96% 94% 91% 90%  Weight:   81.1 kg   Height:        Intake/Output Summary (Last 24 hours) at 10/08/2019 1457 Last data filed at 10/08/2019 1300 Gross per 24 hour  Intake 1280 ml  Output 2624 ml  Net -1344 ml    03/20 1901 - 03/22 0700 In: 960 [P.O.:960] Out: 3802 [Urine:3800]  Filed Weights   10/06/19 0428 10/07/19 0539 10/08/19 0515  Weight: 82.2 kg 81.2 kg 81.1 kg    Physical Examination:   General-appears in no acute distress Heart-S1-S2, regular, no murmur auscultated Lungs-clear to auscultation bilaterally, no wheezing or crackles auscultated Abdomen-soft, nontender, no organomegaly Extremities-trace edema of the lower extremities noted.  Left upper extremity is edematous. Neuro-alert, oriented x3, no focal deficit noted  Data Reviewed:   Recent Results (from the past 240 hour(s))  SARS CORONAVIRUS 2 (TAT 6-24 HRS) Nasopharyngeal Nasopharyngeal Swab     Status: None   Collection Time: 10/01/19  9:29 PM   Specimen: Nasopharyngeal Swab  Result Value Ref Range Status   SARS Coronavirus 2 NEGATIVE NEGATIVE Final    Comment: (NOTE) SARS-CoV-2 target nucleic acids are  NOT DETECTED. The SARS-CoV-2 RNA is generally detectable in upper and lower respiratory specimens during the acute phase of infection. Negative results do not preclude SARS-CoV-2 infection, do not rule out co-infections with other pathogens, and should not be used as the sole  basis for treatment or other patient management decisions. Negative results must be combined with clinical observations, patient history, and epidemiological information. The expected result is Negative. Fact Sheet for Patients: SugarRoll.be Fact Sheet for Healthcare Providers: https://www.woods-mathews.com/ This test is not yet approved or cleared by the [place] FDA and  has been authorized for detection and/or diagnosis of SARS-CoV-2 by FDA under an Emergency Use Authorization (EUA). This EUA will remain  in effect (meaning this test can be used) for the duration of the COVID-19 declaration under Section 56 4(b)(1) of the Act, 21 U.S.C. section 360bbb-3(b)(1), unless the authorization is terminated or revoked sooner. Performed at [facility], [place] [address] [place], [place] [address]   Culture, Urine     Status: Abnormal   Collection Time: 10/02/19 12:25 AM   Specimen: Urine, Clean Catch  Result Value Ref Range Status   Specimen Description URINE, CLEAN CATCH  Final   Special Requests NONE  Final   Culture (A)  Final    <10,000 COLONIES/mL INSIGNIFICANT GROWTH Performed at [facility], [place] [address], [place], [place] [address]    Report Status 10/03/2019 FINAL  Final    No results for input(s): LIPASE, AMYLASE in the last 168 hours. No results for input(s): AMMONIA in the last 168 hours.  Cardiac Enzymes: No results for input(s): CKTOTAL, CKMB, CKMBINDEX, TROPONINI in the last 168 hours. BNP (last 3 results) Recent Labs    12/19/18 1621 12/23/18 0444 10/01/19 1724  BNP 1,637.0* 1,655.2* 2,092.5*       Admission status: The  appropriate admission status for this patient is INPATIENT. Inpatient status is judged to be reasonable and necessary in order to provide the required intensity of service to ensure the patient's safety. The patient's presenting symptoms, physical exam findings, and initial radiographic and laboratory data in the context of their chronic comorbidities is felt to place them at high risk for further clinical deterioration. Furthermore, it is not anticipated that the patient will be medically stable for discharge from the hospital within 2 midnights of admission. The following factors support the admission status of inpatient.  The patient's presenting symptoms include.  Bilateral lower extremity edema, left upper extremity swelling The worrisome physical exam findings include bilateral lower extremity edema The initial radiographic and laboratory data are worrisome because of CHF exacerbation The chronic co-morbidities include CKD stage IIIb/IV   * I certify that at the point of admission it is my clinical judgment that the patient will require inpatient hospital care spanning beyond 2 midnights from the point of admission due to high intensity of service, high risk for further deterioration and high frequency of surveillance required.Oswald Hillock   [facility] If 7PM-7AM, please contact night-coverage at [email or web address], Office  [phone]   10/08/2019, 2:57 PM  LOS: 6 days

## 2019-10-08 NOTE — Progress Notes (Signed)
ANTICOAGULATION CONSULT NOTE - Follow-Up Consult  Pharmacy Consult for Warfarin  Indication: atrial fibrillation  Patient Measurements: Height: 5' (152.4 cm) Weight: 178 lb 14.4 oz (81.1 kg) IBW/kg (Calculated) : 45.5  Vital Signs: Temp: 97.3 F (36.3 C) (03/22 0515) Temp Source: Oral (03/22 0515) BP: 155/82 (03/22 0515) Pulse Rate: 92 (03/22 0515)  Labs: Recent Labs    10/06/19 0337 10/07/19 0409 10/08/19 0508  HGB  --   --  10.7*  HCT  --   --  36.0  PLT  --   --  226  LABPROT 27.0* 28.4* 32.4*  INR 2.5* 2.7* 3.2*  CREATININE 2.81* 2.84* 3.17*    Estimated Creatinine Clearance: 14.5 mL/min (A) (by C-G formula based on SCr of 3.17 mg/dL (H)).   Assessment: 74 y/o F with chief complaint of bilateral lower extremity swelling, on warfarin PTA for hx Afib/CVA (CHADS2VASc = 9).  Warfarin regimen per outpatient anti-coag notes: 2.5 mg on Mondays, 5 mg all other days  INR supratherapeutic at 3.2, decent jump from 2.7 likely reflecting doses of 5mg . No reports of bleeding. Eating 90-100%. H/H, plt stable. Would rather keep patient's INR on the higher end given significant clotting risk.   Goal of Therapy:  INR 2-3 Monitor platelets by anticoagulation protocol: Yes   Plan:  Warfarin 2mg  x 1 Monitor daily INR, CBC/plt Monitor for signs/symptoms of bleeding    Benetta Spar, PharmD, BCPS, BCCP Clinical Pharmacist  Please check AMION for all [place] phone numbers After 10:00 PM, call [place] [phone]

## 2019-10-08 NOTE — Progress Notes (Signed)
Progress Note  Patient Name: [name] Date of Encounter: 10/08/2019  Primary Cardiologist: [name], MD   Subjective   She feels that she is 80% better, though still has swelling in her left arm and bilateral legs. We discussed that her creatinine is up slightly today. She was hoping to go home soon but my suggestion would be to stay to follow her kidney function. She is amenable to this.   She tells me she was on torsemide 100 mg at home. She was tried on torsemide 20 mg by Coletta Memos 09/19/19 but reports to me she didn't make much urine on this dose.   No chest pain. Breathing improved. Wears compression stockings when she can get them on.  Inpatient Medications    Scheduled Meds: . Chlorhexidine Gluconate Cloth  6 each Topical Daily  . dapsone  25 mg Oral Daily  . insulin aspart  0-15 Units Subcutaneous TID WC  . insulin aspart  0-5 Units Subcutaneous QHS  . insulin glargine  30 Units Subcutaneous Daily  . levothyroxine  50 mcg Oral QAC breakfast  . metoprolol tartrate  12.5 mg Oral BID  . potassium chloride  20 mEq Oral Daily  . predniSONE  5 mg Oral Q breakfast  . rosuvastatin  20 mg Oral QODAY  . sodium chloride flush  3 mL Intravenous Q12H  . tacrolimus  3 mg Oral BID  . warfarin  2 mg Oral ONCE-1600  . Warfarin - Pharmacist Dosing Inpatient   Does not apply q1600   Continuous Infusions: . sodium chloride 30 mL (10/07/19 1016)  . furosemide 120 mg (10/08/19 0824)   PRN Meds: sodium chloride, acetaminophen, bisacodyl, HYDROcodone-acetaminophen, ondansetron (ZOFRAN) IV, polyethylene glycol, polyvinyl alcohol, sodium chloride flush   Vital Signs    Vitals:   10/07/19 0917 10/07/19 1156 10/07/19 2057 10/08/19 0515  BP: (!) 152/65 (!) 144/101 (!) 154/81 (!) 155/82  Pulse: 73 (!) 105 64 92  Resp:  20 20 18   Temp: 97.7 F (36.5 C) 98.4 F (36.9 C) 98.6 F (37 C) (!) 97.3 F (36.3 C)  TempSrc: Oral Oral Oral Oral  SpO2: 98% 96% 94% 91%  Weight:     81.1 kg  Height:        Intake/Output Summary (Last 24 hours) at 10/08/2019 1040 Last data filed at 10/08/2019 0848 Gross per 24 hour  Intake 1280 ml  Output 2702 ml  Net -1422 ml   Last 3 Weights 10/08/2019 10/07/2019 10/06/2019  Weight (lbs) 178 lb 14.4 oz 179 lb 1.6 oz 181 lb 3.2 oz  Weight (kg) 81.149 kg 81.239 kg 82.192 kg      Telemetry    Appears this AM to be in rate controlled atrial fibrillation - Personally Reviewed  ECG    No new since 3/15 - Personally Reviewed  Physical Exam   GEN: No acute distress.   Neck: JVD to mid neck at 90 degrees Cardiac: irregularly irregular, no rubs, or gallops. 2/6 systolic murmur Respiratory: Clear to auscultation bilaterally. GI: Soft, nontender, non-distended  MS: 1+ bilateral LE edema and lefty upper extremity edema Neuro:  Nonfocal  Psych: Normal affect   Labs    High Sensitivity Troponin:  No results for input(s): TROPONINIHS in the last 720 hours.    Chemistry Recent Labs  Lab 10/03/19 0635 10/04/19 0514 10/06/19 0337 10/07/19 0409 10/08/19 0508  NA 141   < > 141 142 140  K 3.3*   < > 3.9 3.5 3.6  CL  101   < > 105 101 99  CO2 27   < > 23 27 28   GLUCOSE 107*   < > 72 147* 168*  BUN 64*   < > 72* 68* 75*  CREATININE 2.85*   < > 2.81* 2.84* 3.17*  CALCIUM 9.2   < > 9.5 9.9 9.5  ALBUMIN 2.4*  --   --   --   --   GFRNONAA 16*   < > 16* 16* 14*  GFRAA 18*   < > 18* 18* 16*  ANIONGAP 13   < > 13 14 13    < > = values in this interval not displayed.     Hematology Recent Labs  Lab 10/01/19 1724 10/03/19 0609 10/08/19 0508  WBC 6.6 7.8 6.5  RBC 4.57 4.31 4.22  HGB 11.5* 10.9* 10.7*  HCT 39.8 37.1 36.0  MCV 87.1 86.1 85.3  MCH 25.2* 25.3* 25.4*  MCHC 28.9* 29.4* 29.7*  RDW 20.7* 20.2* 19.5*  PLT 237 249 226    BNP Recent Labs  Lab 10/01/19 1724  BNP 2,092.5*     DDimer No results for input(s): DDIMER in the last 168 hours.   Radiology    No results found.  Cardiac Studies   Echo 10/02/19 1.  Left ventricular ejection fraction, by estimation, is 60 to 65%. The  left ventricle has low normal function. The left ventricle has no regional  wall motion abnormalities. There is mild left ventricular hypertrophy.  Left ventricular diastolic function  could not be evaluated. Left ventricular diastolic function could not be  evaluated. Elevated left ventricular end-diastolic pressure.  2. Right ventricular systolic function is normal. The right ventricular  size is normal. There is severely elevated pulmonary artery systolic  pressure. The estimated right ventricular systolic pressure is 76.5 mmHg.  3. Left atrial size was moderately dilated.  4. Right atrial size was mildly dilated.  5. The mitral valve is abnormal. Mild to moderate mitral valve  regurgitation.  6. Tricuspid valve regurgitation is mild to moderate.  7. The aortic valve has been repaired/replaced. Aortic valve  regurgitation is trivial. There is a 23 mm Edwards bovine valve present in  the aortic position. Procedure Date: 04/12/2013. Aortic valve area, by VTI  measures 1.66 cm. Aortic valve mean  gradient measures 13.0 mmHg. Aortic valve Vmax measures 2.72 m/s.  8. The inferior vena cava is dilated in size with <50% respiratory  variability, suggesting right atrial pressure of 15 mmHg.   Comparison(s): Changes from prior study are noted. 12/20/2018: LVEF 60-65%,  RVSP 44 mmHg.   Patient Profile     74 y.o. female with past medical history of aortic stenosis status post aortic valve replacement, atrial fibrillation, prior embolic CVA, coronary artery disease, hypertension, chronic diastolic congestive heart failure, hyperlipidemia, prior renal transplant, lung cancer for evaluation of acute on chronic diastolic congestive heart failure.   Assessment & Plan    Acute on chronic diastolic heart failure, severe pulmonary hypertension: -was diuresing with 120 mg IV lasix BID and metolazone PRN. However, Cr bumped  significantly today -RVSP 83 mmHg on recent echo -admission weight 81.6 kg, weight today 81.1 kg -charted net negative 4.5 L -she feels that she is improved, but still has JVD and edema on exam -suspect we will need to hold diuresis today. If Cr improves, can trial oral torsemide tomorrow. Will follow for nephrology recommendations as well  Acute on chronic kidney disease, stage 4, with prior renal transplant: -Cr up to  3.17 today, was 2.84 yesterday -nephrology is following  History of AVR (52mm Edwards bovine) -normal function on echo this admission  Paroxysmal atrial fibrillation: remains in sinus rhythm -on metoprolol 12.5 mg BID -anticoagulation with coumadin  History of CAD:  -continue rosuvastatin -no aspirin while on anticoagulation  Hypertension: elevated today, has been labile -recommended by nephrology to allow BP to be slightly elevated to aid diuresis -was on hydralazine this admission, currently held.  For questions or updates, please contact [place] Please consult [email or web address] for contact info under        Signed, Buford Dresser, MD  10/08/2019, 10:40 AM

## 2019-10-08 NOTE — Plan of Care (Signed)
  Problem: Cardiac: Goal: Ability to achieve and maintain adequate cardiopulmonary perfusion will improve Outcome: Progressing   Problem: Health Behavior/Discharge Planning: Goal: Ability to manage health-related needs will improve Outcome: Progressing   Problem: Clinical Measurements: Goal: Respiratory complications will improve Outcome: Progressing

## 2019-10-08 NOTE — Progress Notes (Signed)
[facility] Progress Note  Name: [name] MRN: [id number] DOB: Jan 30, 1946  Chief Complaint:  Leg swelling  Subjective/Interval event: I/Os yesterday 960/2700 with lasix 120 IV BID. Says she feels globally better than presentation, less edema, less dyspnea, stronger.     Intake/Output Summary (Last 24 hours) at 10/08/2019 1031 Last data filed at 10/08/2019 0848 Gross per 24 hour  Intake 1280 ml  Output 2702 ml  Net -1422 ml    Vitals:  Vitals:   10/07/19 0917 10/07/19 1156 10/07/19 2057 10/08/19 0515  BP: (!) 152/65 (!) 144/101 (!) 154/81 (!) 155/82  Pulse: 73 (!) 105 64 92  Resp:  20 20 18   Temp: 97.7 F (36.5 C) 98.4 F (36.9 C) 98.6 F (37 C) (!) 97.3 F (36.3 C)  TempSrc: Oral Oral Oral Oral  SpO2: 98% 96% 94% 91%  Weight:    81.1 kg  Height:         Physical Exam:  General NAD,  HEENT EOMI Neck +JVD Lungs normal WOB, dec BS bases, no crackles or wheezes Heart RRR, SEM 2/6 Abdomen soft nontender nondistended; allograft nontender Extremities 1+ edema lower extremities and LUE   Psych normal mood and affect LUE AVF with thrill/bruit   Medications reviewed     Labs:  BMP Latest Ref Rng & Units 10/08/2019 10/07/2019 10/06/2019  Glucose 70 - 99 mg/dL 168(H) 147(H) 72  BUN 8 - 23 mg/dL 75(H) 68(H) 72(H)  Creatinine 0.44 - 1.00 mg/dL 3.17(H) 2.84(H) 2.81(H)  BUN/Creat Ratio 12 - 28 - - -  Sodium 135 - 145 mmol/L 140 142 141  Potassium 3.5 - 5.1 mmol/L 3.6 3.5 3.9  Chloride 98 - 111 mmol/L 99 101 105  CO2 22 - 32 mmol/L 28 27 23   Calcium 8.9 - 10.3 mg/dL 9.5 9.9 9.5     Assessment/Plan:   # Acute on chronic diastolic CHF  - Lasix 354 IV  BID and metolazone PRN per cardiology, holding diuresis today in setting of rising cr but she still appears quite overloaded to me and despite chart showing -4.5L this admission, weight not really changed.   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  # Chronic hypoxic resp failure - h/o lung cancer treated with XRT only--Ia (T1b, N0, M0) non-small cell lung cancer, well-differentiated adenocarcinoma with biopsy-proven right upper lobe pulmonary nodule diagnosed in May 2019.  - on supplemental oxygen   # Anemia - normocytic, Hb in 10s currently   Justin Mend, MD 10/08/2019  10:31 AM

## 2019-10-08 NOTE — Care Management Important Message (Signed)
Important Message  Patient Details  Name: [name] MRN: [id number] Date of Birth: [birth year]   Medicare Important Message Given:  Yes     [name] 10/08/2019, 9:58 AM

## 2019-10-09 LAB — BASIC METABOLIC PANEL
Anion gap: 10 (ref 5–15)
BUN: 75 mg/dL — ABNORMAL HIGH (ref 8–23)
CO2: 34 mmol/L — ABNORMAL HIGH (ref 22–32)
Calcium: 9.7 mg/dL (ref 8.9–10.3)
Chloride: 95 mmol/L — ABNORMAL LOW (ref 98–111)
Creatinine, Ser: 3.28 mg/dL — ABNORMAL HIGH (ref 0.44–1.00)
GFR calc Af Amer: 15 mL/min — ABNORMAL LOW (ref >60)
GFR calc non Af Amer: 13 mL/min — ABNORMAL LOW (ref >60)
Glucose, Bld: 125 mg/dL — ABNORMAL HIGH (ref 70–99)
Potassium: 3 mmol/L — ABNORMAL LOW (ref 3.5–5.1)
Sodium: 139 mmol/L (ref 135–145)

## 2019-10-09 LAB — GLUCOSE, CAPILLARY
Glucose-Capillary: 75 mg/dL (ref 70–99)
Glucose-Capillary: 220 mg/dL — ABNORMAL HIGH (ref 70–99)
Glucose-Capillary: 110 mg/dL — ABNORMAL HIGH (ref 70–99)
Glucose-Capillary: 174 mg/dL — ABNORMAL HIGH (ref 70–99)

## 2019-10-09 LAB — CBC
HCT: 36 % (ref 36.0–46.0)
Hemoglobin: 11 g/dL — ABNORMAL LOW (ref 12.0–15.0)
MCH: 25.7 pg — ABNORMAL LOW (ref 26.0–34.0)
MCHC: 30.6 g/dL (ref 30.0–36.0)
MCV: 84.1 fL (ref 80.0–100.0)
Platelets: 238 10*3/uL (ref 150–400)
RBC: 4.28 MIL/uL (ref 3.87–5.11)
RDW: 19.3 % — ABNORMAL HIGH (ref 11.5–15.5)
WBC: 6.8 10*3/uL (ref 4.0–10.5)
nRBC: 0 % (ref 0.0–0.2)

## 2019-10-09 LAB — PROTIME-INR
INR: 2.7 — ABNORMAL HIGH (ref 0.8–1.2)
Prothrombin Time: 28.2 seconds — ABNORMAL HIGH (ref 11.4–15.2)

## 2019-10-09 MED ORDER — WARFARIN SODIUM 5 MG PO TABS
5.0000 mg | ORAL_TABLET | Freq: Once | ORAL | Status: AC
  Administered 2019-10-09: 5 mg via ORAL
  Filled 2019-10-09: qty 1

## 2019-10-09 MED ORDER — HYDRALAZINE HCL 25 MG PO TABS
25.0000 mg | ORAL_TABLET | Freq: Three times a day (TID) | ORAL | Status: DC
  Administered 2019-10-09 (×2): 25 mg via ORAL
  Filled 2019-10-09 (×4): qty 1

## 2019-10-09 NOTE — Progress Notes (Signed)
[facility] Progress Note  Name: Charlaine Utsey MRN: [id number] DOB: 10-24-1945  Chief Complaint:  Leg swelling  Subjective/Interval event: I/Os yesterday 2/4.9 with lasix 120 IV BID yesterday. Says she feels globally better than presentation, less edema, less dyspnea, stronger.  Wt from 82.7 peak to 78.9kg this AM.  Cr 3.28 this AM.    Intake/Output Summary (Last 24 hours) at 10/09/2019 1045 Last data filed at 10/09/2019 0900 Gross per 24 hour  Intake 1760 ml  Output 4923 ml  Net -3163 ml    Vitals:  Vitals:   10/08/19 2022 10/09/19 0201 10/09/19 0350 10/09/19 0848  BP: (!) 154/56  (!) 158/84 (!) 176/69  Pulse: 99  79   Resp: 18  18 18   Temp: (!) 97.4 F (36.3 C)  97.8 F (36.6 C)   TempSrc: Oral  Oral   SpO2: 100%  91% 96%  Weight:  78.9 kg    Height:         Physical Exam:  General NAD   HEENT EOMI Neck +JVD Lungs normal WOB, dec BS bases, no crackles or wheezes Heart RRR, SEM 2/6 Abdomen soft nontender nondistended; allograft nontender Extremities 1+ edema lower extremities and LUE - globally improved from yesterday Psych normal mood and affect LUE AVF with thrill/bruit   Medications reviewed     Labs:  BMP Latest Ref Rng & Units 10/09/2019 10/08/2019 10/07/2019  Glucose 70 - 99 mg/dL 125(H) 168(H) 147(H)  BUN 8 - 23 mg/dL 75(H) 75(H) 68(H)  Creatinine 0.44 - 1.00 mg/dL 3.28(H) 3.17(H) 2.84(H)  BUN/Creat Ratio 12 - 28 - - -  Sodium 135 - 145 mmol/L 139 140 142  Potassium 3.5 - 5.1 mmol/L 3.0(L) 3.6 3.5  Chloride 98 - 111 mmol/L 95(L) 99 101  CO2 22 - 32 mmol/L 34(H) 28 27  Calcium 8.9 - 10.3 mg/dL 9.7 9.5 9.9     Assessment/Plan:   # Acute on chronic diastolic CHF  -  holding diuresis today in setting of rising cr; despite chart showing -8.1L this admission, weight not really changed, question accuracy.  She's overall feeling improved and looks better.   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    # Hypertension   - With overload  - diuretics as above - Will resume hydralazine 25 TID  # Chronic hypoxic resp failure - h/o lung cancer treated with XRT only--Ia (T1b, N0, M0) non-small cell lung cancer, well-differentiated adenocarcinoma with biopsy-proven right upper lobe pulmonary nodule diagnosed in May 2019.  - on supplemental oxygen   # Anemia - normocytic, Hb in 10-11s currently  Dispo:  Remain inpt today with rising Cr   Justin Mend, MD 10/09/2019 10:45 AM

## 2019-10-09 NOTE — Progress Notes (Signed)
ANTICOAGULATION CONSULT NOTE - Follow-Up Consult  Pharmacy Consult for Warfarin  Indication: atrial fibrillation  Patient Measurements: Height: 5' (152.4 cm) Weight: 174 lb (78.9 kg) IBW/kg (Calculated) : 45.5  Vital Signs: Temp: 97.8 F (36.6 C) (03/23 0350) Temp Source: Oral (03/23 0350) BP: 176/69 (03/23 0848) Pulse Rate: 79 (03/23 0350)  Labs: Recent Labs    10/07/19 0409 10/08/19 0508 10/09/19 0417  HGB  --  10.7* 11.0*  HCT  --  36.0 36.0  PLT  --  226 238  LABPROT 28.4* 32.4* 28.2*  INR 2.7* 3.2* 2.7*  CREATININE 2.84* 3.17* 3.28*    Estimated Creatinine Clearance: 13.8 mL/min (A) (by C-G formula based on SCr of 3.28 mg/dL (H)).   Assessment: 74 y/o F with chief complaint of bilateral lower extremity swelling, on warfarin PTA for hx Afib/CVA (CHADS2VASc = 9).  Warfarin regimen per outpatient anti-coag notes: 2.5 mg on Mondays, 5 mg all other days  INR back in goal range at 2.7 today after slightly lower dose last night. No reports of bleeding. Eating 100% of meals. H/H, plt stable. Would rather keep patient's INR on the higher end given significant clotting risk.   Goal of Therapy:  INR 2-3 Monitor platelets by anticoagulation protocol: Yes   Plan:  Warfarin 5mg  PO x 1 Monitor daily INR, CBC, s/sx bleeding   Arturo Morton, PharmD, BCPS Please check AMION for all [place] contact numbers Clinical Pharmacist 10/09/2019 9:14 AM

## 2019-10-09 NOTE — Progress Notes (Signed)
Progress Note  Patient Name: [name] Date of Encounter: 10/09/2019  Primary Cardiologist: [name], MD   Subjective   Breathing good, denies any CP.   Inpatient Medications    Scheduled Meds: . Chlorhexidine Gluconate Cloth  6 each Topical Daily  . dapsone  25 mg Oral Daily  . insulin aspart  0-15 Units Subcutaneous TID WC  . insulin aspart  0-5 Units Subcutaneous QHS  . insulin glargine  30 Units Subcutaneous Daily  . levothyroxine  50 mcg Oral QAC breakfast  . metoprolol tartrate  12.5 mg Oral BID  . potassium chloride  20 mEq Oral Daily  . predniSONE  5 mg Oral Q breakfast  . rosuvastatin  20 mg Oral QODAY  . sodium chloride flush  3 mL Intravenous Q12H  . tacrolimus  3 mg Oral BID  . warfarin  5 mg Oral ONCE-1600  . Warfarin - Pharmacist Dosing Inpatient   Does not apply q1600   Continuous Infusions: . sodium chloride 30 mL (10/07/19 1016)  . furosemide Stopped (10/09/19 0903)   PRN Meds: sodium chloride, acetaminophen, bisacodyl, HYDROcodone-acetaminophen, ondansetron (ZOFRAN) IV, polyethylene glycol, polyvinyl alcohol, sodium chloride flush   Vital Signs    Vitals:   10/08/19 2022 10/09/19 0201 10/09/19 0350 10/09/19 0848  BP: (!) 154/56  (!) 158/84 (!) 176/69  Pulse: 99  79   Resp: 18  18 18   Temp: (!) 97.4 F (36.3 C)  97.8 F (36.6 C)   TempSrc: Oral  Oral   SpO2: 100%  91% 96%  Weight:  78.9 kg    Height:        Intake/Output Summary (Last 24 hours) at 10/09/2019 0931 Last data filed at 10/09/2019 0900 Gross per 24 hour  Intake 1760 ml  Output 4923 ml  Net -3163 ml   Last 3 Weights 10/09/2019 10/08/2019 10/07/2019  Weight (lbs) 174 lb 178 lb 14.4 oz 179 lb 1.6 oz  Weight (kg) 78.926 kg 81.149 kg 81.239 kg      Telemetry    Atrial rhythm with frequent PACs - Personally Reviewed  ECG    3/15 atrial rhythm with PACs - Personally Reviewed  Physical Exam   GEN: No acute distress.   Neck: No JVD Cardiac: RRR, no murmurs, rubs,  or gallops.  Respiratory: Clear to auscultation bilaterally. GI: Soft, nontender, non-distended  MS: Trace edema; No deformity. Neuro:  Nonfocal  Psych: Normal affect   Labs    High Sensitivity Troponin:  No results for input(s): TROPONINIHS in the last 720 hours.    Chemistry Recent Labs  Lab 10/03/19 [phone] 10/04/19 0514 10/07/19 0409 10/08/19 0508 10/09/19 0417  NA 141   < > 142 140 139  K 3.3*   < > 3.5 3.6 3.0*  CL 101   < > 101 99 95*  CO2 27   < > 27 28 34*  GLUCOSE 107*   < > 147* 168* 125*  BUN 64*   < > 68* 75* 75*  CREATININE 2.85*   < > 2.84* 3.17* 3.28*  CALCIUM 9.2   < > 9.9 9.5 9.7  ALBUMIN 2.4*  --   --   --   --   GFRNONAA 16*   < > 16* 14* 13*  GFRAA 18*   < > 18* 16* 15*  ANIONGAP 13   < > 14 13 10    < > = values in this interval not displayed.     Hematology Recent Labs  Lab 10/03/19  6294 10/08/19 0508 10/09/19 0417  WBC 7.8 6.5 6.8  RBC 4.31 4.22 4.28  HGB 10.9* 10.7* 11.0*  HCT 37.1 36.0 36.0  MCV 86.1 85.3 84.1  MCH 25.3* 25.4* 25.7*  MCHC 29.4* 29.7* 30.6  RDW 20.2* 19.5* 19.3*  PLT 249 226 238    BNPNo results for input(s): BNP, PROBNP in the last 168 hours.   DDimer No results for input(s): DDIMER in the last 168 hours.   Radiology    No results found.  Cardiac Studies   Echo 10/02/2019 1. Left ventricular ejection fraction, by estimation, is 60 to 65%. The  left ventricle has low normal function. The left ventricle has no regional  wall motion abnormalities. There is mild left ventricular hypertrophy.  Left ventricular diastolic function  could not be evaluated. Left ventricular diastolic function could not be  evaluated. Elevated left ventricular end-diastolic pressure.  2. Right ventricular systolic function is normal. The right ventricular  size is normal. There is severely elevated pulmonary artery systolic  pressure. The estimated right ventricular systolic pressure is 76.5 mmHg.  3. Left atrial size was moderately  dilated.  4. Right atrial size was mildly dilated.  5. The mitral valve is abnormal. Mild to moderate mitral valve  regurgitation.  6. Tricuspid valve regurgitation is mild to moderate.  7. The aortic valve has been repaired/replaced. Aortic valve  regurgitation is trivial. There is a 23 mm Edwards bovine valve present in  the aortic position. Procedure Date: 04/12/2013. Aortic valve area, by VTI  measures 1.66 cm. Aortic valve mean  gradient measures 13.0 mmHg. Aortic valve Vmax measures 2.72 m/s.  8. The inferior vena cava is dilated in size with <50% respiratory  variability, suggesting right atrial pressure of 15 mmHg.   Comparison(s): Changes from prior study are noted. 12/20/2018: LVEF 60-65%,  RVSP 44 mmHg.   Patient Profile     74 y.o. female with PMH of AS s/p AVR, PAF s/p maze on coumadin, embolic CVA, ESRD s/p renal transplant 2012, CAD with known occluded RCA, HTN, chronic diastolic CHF and h/o lung CA presented with acute on chronic diastolic heart failure.   Assessment & Plan    1. Acute on chronic diastolic heart failure  - Echo 10/02/2019 EF 60-65%, RVSP 82.9 mmHg, moderate LAE, mild to moderate MR, bioprosthetic aortic valve.   - initially admitted for IV diuresis, 40mg  BID IV lasix increased to 80mg  BID, then 120mg  BID. Finally started having good urinary output once given metolazone.   - I/O -8L during this admission. Current weight is 174 lbs.   - off spironolactone due to renal dysfunction.   - euvolemic by physical exam. Holding diuretic until renal function begin to improve. IV lasix stopped this morning. Cr for the past 2 days were 2.84 --> 3.17 --> 3.28. Likely transition to 40mg  BID torsemide up discharge. Not sure if weight is accurate, therefore hesitant to give metolazone PRN based on weight measurement.    2. Questionable UTI: per hospitalist service, abx discontinued as culture only had insignificant growth.  3. CKD stage IV s/p renal transplant:  followed by nephrology  4. H/o lung CA: treated with radiation therapy only.   5. PAF: on coumadin and metoprolol.   6. H/o AVR 2014: stable on recent echo.   7. HTN  8. HLD  9. DM II      For questions or updates, please contact [place] Please consult [email or web address] for contact info under  Hilbert Corrigan, PA  10/09/2019, 9:31 AM

## 2019-10-09 NOTE — Progress Notes (Signed)
[facility]  PROGRESS NOTE  [name] IPJ:[id number] DOB: 1945-08-13 DOA: 10/01/2019 PCP: [name], MD   Brief HPI:   74 year old female with a history of ESRD s/p cadaveric transplant in 2012, CAD with RCA stent in the past, hypertension admitted with shortness of breath, generalized weakness.  BNP found to be elevated 2000, creatinine was up from baseline 1.8-2.9.  Patient mated with decompensated diastolic heart failure.  Nephrology was consulted for help with diuresis considering patient's history of cadaveric transplant 2012, CKD stage IIIb 4.    Subjective   Patient seen and examined, feels better this morning.  Leg swelling has improved.  IV diuretics are currently on hold due to worsening renal function.   Assessment/Plan:     1. Acute on chronic diastolic CHF-patient was started on IV Lasix, Lasix dose increased to 120 mg IV every 12 hours.  Metolazone added by cardiology.  At this time diuretics is currently on hold due to worsening renal function.  Today creatinine is 3.28.  Cardiology recommends to start torsemide 100 mg daily  from tomorrow morning if creatinine remains stable. 2. Left arm swelling-nephrology ordered left upper extremity venous duplex which is negative for DVT. 3. S/p renal transplant/CKD stage IIIb/IV-s/p cadaveric transplant in 2012, continue prednisone, dapsone, Prograf.  Creatinine is 3.84 today.   Nephrology following.   4. Paroxysmal atrial fibrillation-continue warfarin, metoprolol 5. ?  UTI-urine culture only growing insignificant growth.  IV ceftriaxone was discontinued.   6. Hypertension-blood pressure stable, continue metoprolol.  Also started on hydralazine 25 mg p.o. 3 times daily. 7. Diabetes mellitus type 2-continue sliding scale insulin NovoLog, Lantus. 8. Chronic hypoxic respiratory failure-patient has history of lung cancer treated with radiation treatment only. Continue supplemental oxygen. 9. Hypokalemia-potassium is  3.0.  Patient is on potassium supplementation with K. Dur 20 mEq p.o. daily.  Follow BMP in a.m.    SpO2: 98 % O2 Flow Rate (L/min): 3 L/min   COVID-19 Labs  No results for input(s): DDIMER, FERRITIN, LDH, CRP in the last 72 hours.  Lab Results  Component Value Date   [place] NEGATIVE 10/01/2019   [place] NEGATIVE 12/19/2018     CBG: Recent Labs  Lab 10/08/19 1116 10/08/19 1633 10/08/19 2135 10/09/19 0603 10/09/19 1102  GLUCAP 143* 129* 134* 75 220*    CBC: Recent Labs  Lab 10/03/19 0609 10/08/19 0508 10/09/19 0417  WBC 7.8 6.5 6.8  NEUTROABS 6.2  --   --   HGB 10.9* 10.7* 11.0*  HCT 37.1 36.0 36.0  MCV 86.1 85.3 84.1  PLT 249 226 734    Basic Metabolic Panel: Recent Labs  Lab 10/03/19 0635 10/04/19 0514 10/05/19 0502 10/06/19 0337 10/07/19 0409 10/08/19 0508 10/09/19 0417  NA 141   < > 140 141 142 140 139  K 3.3*   < > 4.0 3.9 3.5 3.6 3.0*  CL 101   < > 103 105 101 99 95*  CO2 27   < > 24 23 27 28  34*  GLUCOSE 107*   < > 174* 72 147* 168* 125*  BUN 64*   < > 71* 72* 68* 75* 75*  CREATININE 2.85*   < > 3.00* 2.81* 2.84* 3.17* 3.28*  CALCIUM 9.2   < > 9.4 9.5 9.9 9.5 9.7  PHOS 4.7*  --   --   --   --   --   --    < > = values in this interval not displayed.     Liver Function  Tests: Recent Labs  Lab 10/03/19 0635  ALBUMIN 2.4*        DVT prophylaxis: Warfarin  Code Status: Full code  Family Communication: No family at bedside  Disposition Plan: 74 year old female admitted with diastolic heart failure, has CKD stage IIIb 4.  Both cardiology and nephrology have been consulted.  Barrier to discharge- started on IV Lasix for diuresis, now has worsening renal function.    Scheduled medications:  . Chlorhexidine Gluconate Cloth  6 each Topical Daily  . dapsone  25 mg Oral Daily  . hydrALAZINE  25 mg Oral Q8H  . insulin aspart  0-15 Units Subcutaneous TID WC  . insulin aspart  0-5 Units Subcutaneous QHS  . insulin glargine  30  Units Subcutaneous Daily  . levothyroxine  50 mcg Oral QAC breakfast  . metoprolol tartrate  12.5 mg Oral BID  . potassium chloride  20 mEq Oral Daily  . predniSONE  5 mg Oral Q breakfast  . rosuvastatin  20 mg Oral QODAY  . sodium chloride flush  3 mL Intravenous Q12H  . tacrolimus  3 mg Oral BID  . warfarin  5 mg Oral ONCE-1600  . Warfarin - Pharmacist Dosing Inpatient   Does not apply [id number]    Consultants: Nephrology  Procedures:    Antibiotics:   Anti-infectives (From admission, onward)   Start     Dose/Rate Route Frequency Ordered Stop   10/02/19 2200  cefTRIAXone (ROCEPHIN) 1 g in sodium chloride 0.9 % 100 mL IVPB  Status:  Discontinued     1 g 200 mL/hr over 30 Minutes Intravenous Every 24 hours 10/01/19 2314 10/03/19 1910   10/02/19 1000  dapsone tablet 25 mg     25 mg Oral Daily 10/01/19 2318     10/01/19 2315  cefTRIAXone (ROCEPHIN) 1 g in sodium chloride 0.9 % 100 mL IVPB     1 g 200 mL/hr over 30 Minutes Intravenous  Once 10/01/19 2301 10/01/19 2350       Objective   Vitals:   10/09/19 0201 10/09/19 0350 10/09/19 0848 10/09/19 1128  BP:  (!) 158/84 (!) 176/69 (!) 170/67  Pulse:  79  100  Resp:  18 18 17   Temp:  97.8 F (36.6 C)  98.9 F (37.2 C)  TempSrc:  Oral  Oral  SpO2:  91% 96% 98%  Weight: 78.9 kg     Height:        Intake/Output Summary (Last 24 hours) at 10/09/2019 1501 Last data filed at 10/09/2019 1300 Gross per 24 hour  Intake 1440 ml  Output 3700 ml  Net -2260 ml    03/21 1901 - 03/23 0700 In: 2200 [P.O.:2200] Out: 5923 [OINOM:7672]  Filed Weights   10/07/19 0539 10/08/19 0515 10/09/19 0201  Weight: 81.2 kg 81.1 kg 78.9 kg    Physical Examination:   General-appears in no acute distress Heart-S1-S2, regular, no murmur auscultated Lungs-clear to auscultation bilaterally, no wheezing or crackles auscultated Abdomen-soft, nontender, no organomegaly Extremities-trace edema in the lower extremities Neuro-alert, oriented x3,  no focal deficit noted  Data Reviewed:   Recent Results (from the past 240 hour(s))  SARS CORONAVIRUS 2 (TAT 6-24 HRS) Nasopharyngeal Nasopharyngeal Swab     Status: None   Collection Time: 10/01/19  9:29 PM   Specimen: Nasopharyngeal Swab  Result Value Ref Range Status   SARS Coronavirus 2 NEGATIVE NEGATIVE Final    Comment: (NOTE) SARS-CoV-2 target nucleic acids are NOT DETECTED. The SARS-CoV-2 RNA is generally detectable in  upper and lower respiratory specimens during the acute phase of infection. Negative results do not preclude SARS-CoV-2 infection, do not rule out co-infections with other pathogens, and should not be used as the sole basis for treatment or other patient management decisions. Negative results must be combined with clinical observations, patient history, and epidemiological information. The expected result is Negative. Fact Sheet for Patients: SugarRoll.be Fact Sheet for Healthcare Providers: https://www.woods-mathews.com/ This test is not yet approved or cleared by the [place] FDA and  has been authorized for detection and/or diagnosis of SARS-CoV-2 by FDA under an Emergency Use Authorization (EUA). This EUA will remain  in effect (meaning this test can be used) for the duration of the COVID-19 declaration under Section 56 4(b)(1) of the Act, 21 U.S.C. section 360bbb-3(b)(1), unless the authorization is terminated or revoked sooner. Performed at [facility], [place] [address] [place], [place] [address]   Culture, Urine     Status: Abnormal   Collection Time: 10/02/19 12:25 AM   Specimen: Urine, Clean Catch  Result Value Ref Range Status   Specimen Description URINE, CLEAN CATCH  Final   Special Requests NONE  Final   Culture (A)  Final    <10,000 COLONIES/mL INSIGNIFICANT GROWTH Performed at [facility], [place] [address], [place], [place] [address]    Report Status 10/03/2019 FINAL  Final    No  results for input(s): LIPASE, AMYLASE in the last 168 hours. No results for input(s): AMMONIA in the last 168 hours.  Cardiac Enzymes: No results for input(s): CKTOTAL, CKMB, CKMBINDEX, TROPONINI in the last 168 hours. BNP (last 3 results) Recent Labs    12/19/18 1621 12/23/18 0444 10/01/19 1724  BNP 1,637.0* 1,655.2* 2,092.5*       Admission status: The appropriate admission status for this patient is INPATIENT. Inpatient status is judged to be reasonable and necessary in order to provide the required intensity of service to ensure the patient's safety. The patient's presenting symptoms, physical exam findings, and initial radiographic and laboratory data in the context of their chronic comorbidities is felt to place them at high risk for further clinical deterioration. Furthermore, it is not anticipated that the patient will be medically stable for discharge from the hospital within 2 midnights of admission. The following factors support the admission status of inpatient.  The patient's presenting symptoms include.  Bilateral lower extremity edema, left upper extremity swelling The worrisome physical exam findings include bilateral lower extremity edema The initial radiographic and laboratory data are worrisome because of CHF exacerbation The chronic co-morbidities include CKD stage IIIb/IV   * I certify that at the point of admission it is my clinical judgment that the patient will require inpatient hospital care spanning beyond 2 midnights from the point of admission due to high intensity of service, high risk for further deterioration and high frequency of surveillance required.Oswald Hillock   [facility] If 7PM-7AM, please contact night-coverage at [email or web address], Office  [phone]   10/09/2019, 3:01 PM  LOS: 7 days            [facility]  PROGRESS NOTE  [name] TDD:[id number] DOB: [birth year] DOA: 10/01/2019 PCP: [name],  MD   Brief HPI:   74 year old female with a history of ESRD s/p cadaveric transplant in 2012, CAD with RCA stent in the past, hypertension admitted with shortness of breath, generalized weakness.  BNP found to be elevated 2000, creatinine was up from baseline 1.8-2.9.  Patient mated  with decompensated diastolic heart failure.  Nephrology was consulted for help with diuresis considering patient's history of cadaveric transplant 2012, CKD stage IIIb 4.    Subjective   Patient seen and examined, leg swelling has improved.  Denies shortness of breath.   Assessment/Plan:     10. Acute on chronic diastolic CHF-patient started on IV Lasix, Lasix dose increased to 120 mg IV every 12 hours.  Metolazone added by cardiology.  Appreciate cardiology input. 11. Left arm swelling-nephrology ordered left upper extremity venous duplex which is negative for DVT. 12. S/p renal transplant/CKD stage IIIb/IV-s/p cadaveric transplant in 2012, continue prednisone, dapsone, Prograf.  Creatinine slowly improving, today creatinine 2.81.  Nephrology following.   13. Paroxysmal atrial fibrillation-continue warfarin, metoprolol 14. ?  UTI-urine culture only growing insignificant growth.  IV ceftriaxone was discontinued.   15. Hypertension-blood pressure stable, continue metoprolol. 16. Diabetes mellitus type 2-continue sliding scale insulin NovoLog, Lantus. 17. Chronic hypoxic respiratory failure-continue supplemental oxygen.    SpO2: 98 % O2 Flow Rate (L/min): 3 L/min   COVID-19 Labs  No results for input(s): DDIMER, FERRITIN, LDH, CRP in the last 72 hours.  Lab Results  Component Value Date   [place] NEGATIVE 10/01/2019   [place] NEGATIVE 12/19/2018     CBG: Recent Labs  Lab 10/08/19 1116 10/08/19 1633 10/08/19 2135 10/09/19 0603 10/09/19 1102  GLUCAP 143* 129* 134* 75 220*    CBC: Recent Labs  Lab 10/03/19 0609 10/08/19 0508 10/09/19 0417  WBC 7.8 6.5 6.8  NEUTROABS 6.2  --   --    HGB 10.9* 10.7* 11.0*  HCT 37.1 36.0 36.0  MCV 86.1 85.3 84.1  PLT 249 226 761    Basic Metabolic Panel: Recent Labs  Lab 10/03/19 0635 10/04/19 0514 10/05/19 0502 10/06/19 0337 10/07/19 0409 10/08/19 0508 10/09/19 0417  NA 141   < > 140 141 142 140 139  K 3.3*   < > 4.0 3.9 3.5 3.6 3.0*  CL 101   < > 103 105 101 99 95*  CO2 27   < > 24 23 27 28  34*  GLUCOSE 107*   < > 174* 72 147* 168* 125*  BUN 64*   < > 71* 72* 68* 75* 75*  CREATININE 2.85*   < > 3.00* 2.81* 2.84* 3.17* 3.28*  CALCIUM 9.2   < > 9.4 9.5 9.9 9.5 9.7  PHOS 4.7*  --   --   --   --   --   --    < > = values in this interval not displayed.     Liver Function Tests: Recent Labs  Lab 10/03/19 0635  ALBUMIN 2.4*        DVT prophylaxis: Warfarin  Code Status: Full code  Family Communication: No family at bedside  Disposition Plan: 74 year old female admitted with diastolic heart failure, has CKD stage IIIb 4.  Both cardiology and nephrology have been consulted.  Barrier to discharge- started on IV Lasix for diuresis.         Scheduled medications:  . Chlorhexidine Gluconate Cloth  6 each Topical Daily  . dapsone  25 mg Oral Daily  . hydrALAZINE  25 mg Oral Q8H  . insulin aspart  0-15 Units Subcutaneous TID WC  . insulin aspart  0-5 Units Subcutaneous QHS  . insulin glargine  30 Units Subcutaneous Daily  . levothyroxine  50 mcg Oral QAC breakfast  . metoprolol tartrate  12.5 mg Oral BID  . potassium chloride  20 mEq Oral  Daily  . predniSONE  5 mg Oral Q breakfast  . rosuvastatin  20 mg Oral QODAY  . sodium chloride flush  3 mL Intravenous Q12H  . tacrolimus  3 mg Oral BID  . warfarin  5 mg Oral ONCE-1600  . Warfarin - Pharmacist Dosing Inpatient   Does not apply [id number]    Consultants: Nephrology  Procedures:    Antibiotics:   Anti-infectives (From admission, onward)   Start     Dose/Rate Route Frequency Ordered Stop   10/02/19 2200  cefTRIAXone (ROCEPHIN) 1 g in sodium  chloride 0.9 % 100 mL IVPB  Status:  Discontinued     1 g 200 mL/hr over 30 Minutes Intravenous Every 24 hours 10/01/19 2314 10/03/19 1910   10/02/19 1000  dapsone tablet 25 mg     25 mg Oral Daily 10/01/19 2318     10/01/19 2315  cefTRIAXone (ROCEPHIN) 1 g in sodium chloride 0.9 % 100 mL IVPB     1 g 200 mL/hr over 30 Minutes Intravenous  Once 10/01/19 2301 10/01/19 2350       Objective   Vitals:   10/09/19 0201 10/09/19 0350 10/09/19 0848 10/09/19 1128  BP:  (!) 158/84 (!) 176/69 (!) 170/67  Pulse:  79  100  Resp:  18 18 17   Temp:  97.8 F (36.6 C)  98.9 F (37.2 C)  TempSrc:  Oral  Oral  SpO2:  91% 96% 98%  Weight: 78.9 kg     Height:        Intake/Output Summary (Last 24 hours) at 10/09/2019 1501 Last data filed at 10/09/2019 1300 Gross per 24 hour  Intake 1440 ml  Output 3700 ml  Net -2260 ml    03/21 1901 - 03/23 0700 In: 2200 [P.O.:2200] Out: 5923 [XBJYN:8295]  Filed Weights   10/07/19 0539 10/08/19 0515 10/09/19 0201  Weight: 81.2 kg 81.1 kg 78.9 kg    Physical Examination:   General-appears in no acute distress Heart-S1-S2, regular, no murmur auscultated Lungs-clear to auscultation bilaterally, no wheezing or crackles auscultated Abdomen-soft, nontender, no organomegaly Extremities-trace edema of the lower extremities noted.  Left upper extremity is edematous. Neuro-alert, oriented x3, no focal deficit noted  Data Reviewed:   Recent Results (from the past 240 hour(s))  SARS CORONAVIRUS 2 (TAT 6-24 HRS) Nasopharyngeal Nasopharyngeal Swab     Status: None   Collection Time: 10/01/19  9:29 PM   Specimen: Nasopharyngeal Swab  Result Value Ref Range Status   SARS Coronavirus 2 NEGATIVE NEGATIVE Final    Comment: (NOTE) SARS-CoV-2 target nucleic acids are NOT DETECTED. The SARS-CoV-2 RNA is generally detectable in upper and lower respiratory specimens during the acute phase of infection. Negative results do not preclude SARS-CoV-2 infection, do not  rule out co-infections with other pathogens, and should not be used as the sole basis for treatment or other patient management decisions. Negative results must be combined with clinical observations, patient history, and epidemiological information. The expected result is Negative. Fact Sheet for Patients: SugarRoll.be Fact Sheet for Healthcare Providers: https://www.woods-mathews.com/ This test is not yet approved or cleared by the [place] FDA and  has been authorized for detection and/or diagnosis of SARS-CoV-2 by FDA under an Emergency Use Authorization (EUA). This EUA will remain  in effect (meaning this test can be used) for the duration of the COVID-19 declaration under Section 56 4(b)(1) of the Act, 21 U.S.C. section 360bbb-3(b)(1), unless the authorization is terminated or revoked sooner. Performed at [facility],  [address], [place], [place] [address]   Culture, Urine     Status: Abnormal   Collection Time: 10/02/19 12:25 AM   Specimen: Urine, Clean Catch  Result Value Ref Range Status   Specimen Description URINE, CLEAN CATCH  Final   Special Requests NONE  Final   Culture (A)  Final    <10,000 COLONIES/mL INSIGNIFICANT GROWTH Performed at [facility], [place] [address] [place], [place] [address]    Report Status 10/03/2019 FINAL  Final    No results for input(s): LIPASE, AMYLASE in the last 168 hours. No results for input(s): AMMONIA in the last 168 hours.  Cardiac Enzymes: No results for input(s): CKTOTAL, CKMB, CKMBINDEX, TROPONINI in the last 168 hours. BNP (last 3 results) Recent Labs    12/19/18 1621 12/23/18 0444 10/01/19 1724  BNP 1,637.0* 1,655.2* 2,092.5*       Admission status: The appropriate admission status for this patient is INPATIENT. Inpatient status is judged to be reasonable and necessary in order to provide the required intensity of service to ensure the patient's safety. The  patient's presenting symptoms, physical exam findings, and initial radiographic and laboratory data in the context of their chronic comorbidities is felt to place them at high risk for further clinical deterioration. Furthermore, it is not anticipated that the patient will be medically stable for discharge from the hospital within 2 midnights of admission. The following factors support the admission status of inpatient.  The patient's presenting symptoms include.  Bilateral lower extremity edema, left upper extremity swelling The worrisome physical exam findings include bilateral lower extremity edema The initial radiographic and laboratory data are worrisome because of CHF exacerbation The chronic co-morbidities include CKD stage IIIb/IV   * I certify that at the point of admission it is my clinical judgment that the patient will require inpatient hospital care spanning beyond 2 midnights from the point of admission due to high intensity of service, high risk for further deterioration and high frequency of surveillance required.Oswald Hillock   [facility] If 7PM-7AM, please contact night-coverage at [email or web address], Office  [phone]   10/09/2019, 3:01 PM  LOS: 7 days

## 2019-10-10 ENCOUNTER — Telehealth: Payer: Self-pay | Admitting: Physician Assistant

## 2019-10-10 DIAGNOSIS — N1832 Chronic kidney disease, stage 3b: Secondary | ICD-10-CM

## 2019-10-10 DIAGNOSIS — J9611 Chronic respiratory failure with hypoxia: Secondary | ICD-10-CM

## 2019-10-10 LAB — BASIC METABOLIC PANEL
Anion gap: 11 (ref 5–15)
BUN: 73 mg/dL — ABNORMAL HIGH (ref 8–23)
CO2: 31 mmol/L (ref 22–32)
Calcium: 9.4 mg/dL (ref 8.9–10.3)
Chloride: 96 mmol/L — ABNORMAL LOW (ref 98–111)
Creatinine, Ser: 3.1 mg/dL — ABNORMAL HIGH (ref 0.44–1.00)
GFR calc Af Amer: 16 mL/min — ABNORMAL LOW (ref >60)
GFR calc non Af Amer: 14 mL/min — ABNORMAL LOW (ref >60)
Glucose, Bld: 156 mg/dL — ABNORMAL HIGH (ref 70–99)
Potassium: 3.1 mmol/L — ABNORMAL LOW (ref 3.5–5.1)
Sodium: 138 mmol/L (ref 135–145)

## 2019-10-10 LAB — GLUCOSE, CAPILLARY
Glucose-Capillary: 149 mg/dL — ABNORMAL HIGH (ref 70–99)
Glucose-Capillary: 163 mg/dL — ABNORMAL HIGH (ref 70–99)
Glucose-Capillary: 167 mg/dL — ABNORMAL HIGH (ref 70–99)
Glucose-Capillary: 271 mg/dL — ABNORMAL HIGH (ref 70–99)

## 2019-10-10 LAB — PROTIME-INR
INR: 2.4 — ABNORMAL HIGH (ref 0.8–1.2)
Prothrombin Time: 26.3 seconds — ABNORMAL HIGH (ref 11.4–15.2)

## 2019-10-10 MED ORDER — POTASSIUM CHLORIDE CRYS ER 20 MEQ PO TBCR
30.0000 meq | EXTENDED_RELEASE_TABLET | ORAL | Status: AC
  Administered 2019-10-10 (×2): 30 meq via ORAL
  Filled 2019-10-10 (×2): qty 1

## 2019-10-10 MED ORDER — WARFARIN SODIUM 5 MG PO TABS
5.0000 mg | ORAL_TABLET | Freq: Once | ORAL | Status: AC
  Administered 2019-10-10: 5 mg via ORAL
  Filled 2019-10-10: qty 1

## 2019-10-10 MED ORDER — TORSEMIDE 100 MG PO TABS
100.0000 mg | ORAL_TABLET | Freq: Every day | ORAL | Status: DC
  Administered 2019-10-10 – 2019-10-11 (×2): 100 mg via ORAL
  Filled 2019-10-10 (×2): qty 1

## 2019-10-10 NOTE — Progress Notes (Signed)
Per Dr. Judeth Cornfield request, have arranged TOC f/u in 1 week. Regarding home health, there is a CM note from 3/17 indicating plans for [facility] and [facility] aide to be arranged at discharge, will need F2F orders from primary team.  Please call with questions. Marzella Miracle PA-C

## 2019-10-10 NOTE — Progress Notes (Signed)
Progress Note  Patient Name: [name] Date of Encounter: 10/10/2019  Primary Cardiologist: [name], MD   Subjective  Feels like her swelling is improving, especially in her left arm/hand and her distal legs. She felt very jittery with the hydralazine yesterday, declined the dose this morning. Continues to have significant neuropathy discomfort in her legs.   Inpatient Medications    Scheduled Meds: . Chlorhexidine Gluconate Cloth  6 each Topical Daily  . dapsone  25 mg Oral Daily  . hydrALAZINE  25 mg Oral Q8H  . insulin aspart  0-15 Units Subcutaneous TID WC  . insulin aspart  0-5 Units Subcutaneous QHS  . insulin glargine  30 Units Subcutaneous Daily  . levothyroxine  50 mcg Oral QAC breakfast  . metoprolol tartrate  12.5 mg Oral BID  . potassium chloride  30 mEq Oral Q4H  . potassium chloride  20 mEq Oral Daily  . predniSONE  5 mg Oral Q breakfast  . rosuvastatin  20 mg Oral QODAY  . sodium chloride flush  3 mL Intravenous Q12H  . tacrolimus  3 mg Oral BID  . torsemide  100 mg Oral Daily  . warfarin  5 mg Oral ONCE-1600  . Warfarin - Pharmacist Dosing Inpatient   Does not apply q1600   Continuous Infusions: . sodium chloride 30 mL (10/07/19 1016)   PRN Meds: sodium chloride, acetaminophen, bisacodyl, HYDROcodone-acetaminophen, ondansetron (ZOFRAN) IV, polyethylene glycol, polyvinyl alcohol, sodium chloride flush   Vital Signs    Vitals:   10/09/19 1646 10/09/19 1944 10/09/19 2100 10/10/19 0556  BP: (!) 152/77 (!) 136/59  (!) 131/55  Pulse: 70 94  90  Resp: 17 20  18   Temp: 98.2 F (36.8 C) 98 F (36.7 C)  98.7 F (37.1 C)  TempSrc: Oral Oral  Oral  SpO2: 93% 94% 95% 94%  Weight:    79 kg  Height:        Intake/Output Summary (Last 24 hours) at 10/10/2019 1024 Last data filed at 10/10/2019 9629 Gross per 24 hour  Intake 360 ml  Output 1600 ml  Net -1240 ml   Last 3 Weights 10/10/2019 10/09/2019 10/08/2019  Weight (lbs) 174 lb 3.2 oz 174 lb 178  lb 14.4 oz  Weight (kg) 79.017 kg 78.926 kg 81.149 kg      Telemetry    rate controlled atrial fibrillation - Personally Reviewed  ECG    No new since 3/15 - Personally Reviewed  Physical Exam   GEN: Well nourished, well developed in no acute distress HEENT: Normal, moist mucous membranes NECK: JVD to low neck at 45 degrees CARDIAC: irregularly irregular rhythm, normal S1 and S2, no rubs or gallops. 3/6 SM VASCULAR: Radial pulses 2+ bilaterally. RESPIRATORY:  Clear to auscultation without rales, wheezing or rhonchi  ABDOMEN: Soft, non-tender, non-distended MUSCULOSKELETAL:  Ambulates independently SKIN: Warm and dry, distal legs with trace edema, but knees and above with 1+ edema. Left upper extremity edema resolved. NEUROLOGIC:  Alert and oriented x 3. No focal neuro deficits noted. PSYCHIATRIC:  Normal affect   Labs    High Sensitivity Troponin:  No results for input(s): TROPONINIHS in the last 720 hours.    Chemistry Recent Labs  Lab 10/08/19 0508 10/09/19 0417 10/10/19 0444  NA 140 139 138  K 3.6 3.0* 3.1*  CL 99 95* 96*  CO2 28 34* 31  GLUCOSE 168* 125* 156*  BUN 75* 75* 73*  CREATININE 3.17* 3.28* 3.10*  CALCIUM 9.5 9.7 9.4  GFRNONAA  14* 13* 14*  GFRAA 16* 15* 16*  ANIONGAP 13 10 11      Hematology Recent Labs  Lab 10/08/19 0508 10/09/19 0417  WBC 6.5 6.8  RBC 4.22 4.28  HGB 10.7* 11.0*  HCT 36.0 36.0  MCV 85.3 84.1  MCH 25.4* 25.7*  MCHC 29.7* 30.6  RDW 19.5* 19.3*  PLT 226 238    BNP No results for input(s): BNP, PROBNP in the last 168 hours.   DDimer No results for input(s): DDIMER in the last 168 hours.   Radiology    No results found.  Cardiac Studies   Echo 10/02/19 1. Left ventricular ejection fraction, by estimation, is 60 to 65%. The  left ventricle has low normal function. The left ventricle has no regional  wall motion abnormalities. There is mild left ventricular hypertrophy.  Left ventricular diastolic function  could  not be evaluated. Left ventricular diastolic function could not be  evaluated. Elevated left ventricular end-diastolic pressure.  2. Right ventricular systolic function is normal. The right ventricular  size is normal. There is severely elevated pulmonary artery systolic  pressure. The estimated right ventricular systolic pressure is 97.6 mmHg.  3. Left atrial size was moderately dilated.  4. Right atrial size was mildly dilated.  5. The mitral valve is abnormal. Mild to moderate mitral valve  regurgitation.  6. Tricuspid valve regurgitation is mild to moderate.  7. The aortic valve has been repaired/replaced. Aortic valve  regurgitation is trivial. There is a 23 mm Edwards bovine valve present in  the aortic position. Procedure Date: 04/12/2013. Aortic valve area, by VTI  measures 1.66 cm. Aortic valve mean  gradient measures 13.0 mmHg. Aortic valve Vmax measures 2.72 m/s.  8. The inferior vena cava is dilated in size with <50% respiratory  variability, suggesting right atrial pressure of 15 mmHg.   Comparison(s): Changes from prior study are noted. 12/20/2018: LVEF 60-65%,  RVSP 44 mmHg.   Patient Profile     74 y.o. female with past medical history of aortic stenosis status post aortic valve replacement, atrial fibrillation, prior embolic CVA, coronary artery disease, hypertension, chronic diastolic congestive heart failure, hyperlipidemia, prior renal transplant, lung cancer for evaluation of acute on chronic diastolic congestive heart failure.   Assessment & Plan    Acute on chronic diastolic heart failure, severe pulmonary hypertension: -was diuresing with 120 mg IV lasix BID and metolazone PRN. However, Cr bumped significantly. Cr now improving with holding diuretics. -RVSP 83 mmHg on recent echo -admission weight 81.6 kg, weight today 79 kg -charted net negative 7.4 L, which does not match weights, question accuracy -start torsemide 100 mg daily today. She did not  respond with 20 mg torsemide as an outpatient. She reports distantly that she was on 100 mg BID.  -we discussed daily weights, salt avoidance -if her weights increase at home, she needs to contact the office and we may need to go up to BID torsemide dosing -her renal function also needs to be followed closely -given her tenuous balance between fluid and kidney function, she would benefit from home nursing/home monitoring program. -K is 3.1. Being repleted by primary team  Acute on chronic kidney disease, stage 4, with prior renal transplant: -Cr peaked at 3.28, today 3.10 -nephrology is following, appreciate recommendations  History of AVR (14mm Edwards bovine) -normal function on echo this admission  Paroxysmal atrial fibrillation: remains in sinus rhythm -on metoprolol 12.5 mg BID -anticoagulation with coumadin  History of CAD:  -continue rosuvastatin -no aspirin  while on anticoagulation  Hypertension: elevated today, has been labile -recommended by nephrology to allow BP to be slightly elevated to aid diuresis -has been restarted on hydralazine. She felt poorly with this. Was not on antihypertensives beyond metoprolol as home prior to admission. Would consider amlodipine if needed long term, given her pulmonary hypertension she will likely always have some LE edema, so trial of amlodipine is unlikely to make it worse.  [facility] will sign off.   Medication Recommendations:  Diuretic recommendation is torsemide 100 mg daily.  Other recommendations (labs, testing, etc):  Needs BMET in 1 week Follow up as an outpatient:  Needs close follow up. We will get her a transition of care appointment and also see if we can get her enrolled in a home health program.  For questions or updates, please contact [facility] Please consult [email or web address] for contact info under     Signed, Buford Dresser, MD  10/10/2019, 10:24 AM

## 2019-10-10 NOTE — Progress Notes (Signed)
ANTICOAGULATION CONSULT NOTE - Follow-Up Consult  Pharmacy Consult for Warfarin  Indication: atrial fibrillation  Patient Measurements: Height: 5' (152.4 cm) Weight: 174 lb 3.2 oz (79 kg)(scale c) IBW/kg (Calculated) : 45.5  Vital Signs: Temp: 98.7 F (37.1 C) (03/24 0556) Temp Source: Oral (03/24 0556) BP: 131/55 (03/24 0556) Pulse Rate: 90 (03/24 0556)  Labs: Recent Labs    10/08/19 0508 10/09/19 0417 10/10/19 0444  HGB 10.7* 11.0*  --   HCT 36.0 36.0  --   PLT 226 238  --   LABPROT 32.4* 28.2* 26.3*  INR 3.2* 2.7* 2.4*  CREATININE 3.17* 3.28* 3.10*    Estimated Creatinine Clearance: 14.6 mL/min (A) (by C-G formula based on SCr of 3.1 mg/dL (H)).   Assessment: 74 y/o F with chief complaint of bilateral lower extremity swelling, on warfarin PTA for hx Afib/CVA (CHADS2VASc = 9).  Warfarin regimen per outpatient anti-coag notes: 2.5 mg on Mondays, 5 mg all other days  INR remains in goal range at 2.4. No reports of bleeding. Eating 100% of meals. H/H, plt stable. No active bleed issues reported.  Goal of Therapy:  INR 2-3 Monitor platelets by anticoagulation protocol: Yes   Plan:  Warfarin 5mg  PO x 1 Monitor daily INR, CBC, s/sx bleeding   Arturo Morton, PharmD, BCPS Please check AMION for all [place] contact numbers Clinical Pharmacist 10/10/2019 8:58 AM

## 2019-10-10 NOTE — Telephone Encounter (Signed)
   Attention TOC pool,  This patient will need a TOC phone call after discharge. They are being discharged within the next day or so. Follow-up appointment has already been arranged with: Roby Lofts on 3/31 They are a patient of [name], MD.  Thank you! Charlie Pitter

## 2019-10-10 NOTE — Progress Notes (Signed)
PROGRESS NOTE    [name]  JSE:[id number] DOB: 13-Jul-1946 DOA: 10/01/2019 PCP: [name], MD   Brief Narrative:  74 year old female with a history of ESRD s/p cadaveric transplant in 2012, CAD with RCA stent in the past, hypertension admitted with shortness of breath, generalized weakness.  BNP found to be elevated 2000, creatinine was up from baseline 1.8-2.9.  Patient mated with decompensated diastolic heart failure.  Nephrology was consulted for help with diuresis considering patient's history of cadaveric transplant 2012, CKD stage IIIb 4.  Upon admission patient was gently diuresed during the hospitalization, renal function slowly improved.  Nephrology team was following the patient.  Cardiology transition diuretics to oral torsemide.   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  Will transition to torsemide 100 mg daily daily, monitor response.  Monitor creatinine for 1 more day. -Echo shows EF of 60 to 65%.  Left upper extremity swelling -Doppler is negative for DVT.  Acute kidney injury on CKD stage IIIb History of cadaveric renal transplant 2012 -Continue home immunosuppressants.  Renal function slowly improving.  This morning is 3.1.  If remains stable and improving on p.o. torsemide today, plans on discharge tomorrow.  Nephrology team following.  Essential  hypertension -Resume home meds  Diabetes mellitus type 2 -On sliding scale and Lantus.  Lantus 30 units daily  Hyperlipidemia -Crestor  Hypothyroidism -Synthroid.  Chronic hypoxia secondary to prior history of lung cancer being treated with radiation -Supplemental oxygen.     DVT prophylaxis: CoumadinCoumadin Code Status: Full Code  Family Communication: None  Disposition Plan:   Patient From= Home   D/C place= Home  Barriers=  transition to p.o. torsemide today, monitor response and urine output.  If remains stable on p.o. regimen with good urine output plans to discharge her tomorrow.    Subjective: Patient feels overall better today, less dyspnea on exertion today.  Desaturates down to 87% on room air.  Review of Systems Otherwise negative except as per HPI, including: General: Denies fever, chills, night sweats or unintended weight loss. Resp: Denies hemoptysis Cardiac: Denies chest pain, palpitations, orthopnea, paroxysmal nocturnal dyspnea. GI: Denies abdominal pain, nausea, vomiting, diarrhea or constipation GU: Denies dysuria, frequency, hesitancy or incontinence MS: Denies muscle aches, joint pain or swelling Neuro: Denies headache, neurologic deficits (focal weakness, numbness, tingling), abnormal gait Psych: Denies anxiety, depression, SI/HI/AVH Skin: Denies new rashes or lesions ID: Denies sick contacts, exotic exposures, travel  Examination:  General exam: Appears calm and comfortable, 2 L nasal cannula Respiratory system: Bibasilar crackles Cardiovascular system: S1 & S2 heard, RRR. No JVD, murmurs, rubs, gallops or clicks. No pedal edema. Gastrointestinal system: Abdomen is nondistended, soft and nontender. No organomegaly or masses felt. Normal bowel sounds heard. Central nervous system: Alert and oriented. No focal neurological deficits. Extremities: Symmetric 5 x 5 power.  Compression stockings in place in the lower extremities Skin: No rashes,  lesions or ulcers Psychiatry: Judgement and  insight appear normal. Mood & affect appropriate.     Objective: Vitals:   10/09/19 2100 10/10/19 0556 10/10/19 1050 10/10/19 1051  BP:  (!) 131/55 (!) 136/96 139/76  Pulse:  90 (!) 121 (!) 40  Resp:  18    Temp:  98.7 F (37.1 C)    TempSrc:  Oral    SpO2: 95% 94% (!) 82% (!) 87%  Weight:  79 kg    Height:        Intake/Output Summary (Last 24 hours) at 10/10/2019 1207 Last data filed at 10/10/2019 8119 Gross per 24 hour  Intake 360 ml  Output 1600 ml  Net -1240 ml   Filed Weights   10/08/19 0515 10/09/19 0201 10/10/19 0556  Weight: 81.1 kg 78.9 kg 79 kg     Data Reviewed:   CBC: Recent Labs  Lab 10/08/19 0508 10/09/19 0417  WBC 6.5 6.8  HGB 10.7* 11.0*  HCT 36.0 36.0  MCV 85.3 84.1  PLT 226 147   Basic Metabolic Panel: Recent Labs  Lab 10/06/19 0337 10/07/19 0409 10/08/19 0508 10/09/19 0417 10/10/19 0444  NA 141 142 140 139 138  K 3.9 3.5 3.6 3.0* 3.1*  CL 105 101 99 95* 96*  CO2 23 27 28  34* 31  GLUCOSE 72 147* 168* 125* 156*  BUN 72* 68* 75* 75* 73*  CREATININE 2.81* 2.84* 3.17* 3.28* 3.10*  CALCIUM 9.5 9.9 9.5 9.7 9.4   GFR: Estimated Creatinine Clearance: 14.6 mL/min (A) (by C-G formula based on SCr of 3.1 mg/dL (H)). Liver Function Tests: No results for input(s): AST, ALT, ALKPHOS, BILITOT, PROT, ALBUMIN in the last 168 hours. No results for input(s): LIPASE, AMYLASE in the last 168 hours. No results for input(s): AMMONIA in the last 168 hours. Coagulation Profile: Recent Labs  Lab 10/06/19 0337 10/07/19 0409 10/08/19 0508 10/09/19 0417 10/10/19 0444  INR 2.5* 2.7* 3.2* 2.7* 2.4*   Cardiac Enzymes: No results for input(s): CKTOTAL, CKMB, CKMBINDEX, TROPONINI in the last 168 hours. BNP (last 3 results) No results for input(s): PROBNP in the last 8760 hours. HbA1C: No results for input(s): HGBA1C in the last 72 hours. CBG: Recent Labs  Lab 10/09/19 1102 10/09/19 1644 10/09/19 2122  10/10/19 0630 10/10/19 1200  GLUCAP 220* 110* 174* 149* 163*   Lipid Profile: No results for input(s): CHOL, HDL, LDLCALC, TRIG, CHOLHDL, LDLDIRECT in the last 72 hours. Thyroid Function Tests: No results for input(s): TSH, T4TOTAL, FREET4, T3FREE, THYROIDAB in the last 72 hours. Anemia Panel: No results for input(s): VITAMINB12, FOLATE, FERRITIN, TIBC, IRON, RETICCTPCT in the last 72 hours. Sepsis Labs: No results for input(s): PROCALCITON, LATICACIDVEN in the last 168 hours.  Recent Results (from the past 240 hour(s))  SARS CORONAVIRUS 2 (TAT 6-24 HRS) Nasopharyngeal Nasopharyngeal Swab     Status: None   Collection Time: 10/01/19  9:29 PM   Specimen: Nasopharyngeal Swab  Result Value Ref Range Status   SARS Coronavirus 2 NEGATIVE NEGATIVE Final    Comment: (NOTE) SARS-CoV-2 target nucleic acids are NOT DETECTED. The SARS-CoV-2 RNA is generally detectable in upper and lower respiratory specimens during the acute phase of infection. Negative results do not preclude SARS-CoV-2 infection, do not rule out co-infections with other pathogens, and should not be used as the sole basis for treatment or other patient management decisions. Negative results must be combined with clinical observations, patient history, and epidemiological information. The expected result is Negative. Fact Sheet for Patients: SugarRoll.be Fact Sheet for Healthcare Providers: https://www.woods-mathews.com/ This  test is not yet approved or cleared by the [place] and  has been authorized for detection and/or diagnosis of SARS-CoV-2 by FDA under an Emergency Use Authorization (EUA). This EUA will remain  in effect (meaning this test can be used) for the duration of the COVID-19 declaration under Section 56 4(b)(1) of the Act, 21 U.S.C. section 360bbb-3(b)(1), unless the authorization is terminated or revoked sooner. Performed at [facility], [place] [address], [place], [place] [address]   Culture, Urine     Status: Abnormal   Collection Time: 10/02/19 12:25 AM   Specimen: Urine, Clean Catch  Result Value Ref Range Status   Specimen Description URINE, CLEAN CATCH  Final   Special Requests NONE  Final   Culture (A)  Final    <10,000 COLONIES/mL INSIGNIFICANT GROWTH Performed at [facility], [place] [address] [place], [place] [address]    Report Status 10/03/2019 FINAL  Final         Radiology Studies: No results found.      Scheduled Meds: . Chlorhexidine Gluconate Cloth  6 each Topical Daily  . dapsone  25 mg Oral Daily  . hydrALAZINE  25 mg Oral Q8H  . insulin aspart  0-15 Units Subcutaneous TID WC  . insulin aspart  0-5 Units Subcutaneous QHS  . insulin glargine  30 Units Subcutaneous Daily  . levothyroxine  50 mcg Oral QAC breakfast  . metoprolol tartrate  12.5 mg Oral BID  . potassium chloride  30 mEq Oral Q4H  . potassium chloride  20 mEq Oral Daily  . predniSONE  5 mg Oral Q breakfast  . rosuvastatin  20 mg Oral QODAY  . sodium chloride flush  3 mL Intravenous Q12H  . tacrolimus  3 mg Oral BID  . torsemide  100 mg Oral Daily  . warfarin  5 mg Oral ONCE-1600  . Warfarin - Pharmacist Dosing Inpatient   Does not apply q1600   Continuous Infusions: . sodium chloride 30 mL (10/07/19 1016)     LOS: 8 days   Time spent= 25 mins    Nillie Bartolotta Arsenio Loader, MD [facility]  If 7PM-7AM, please contact night-coverage  10/10/2019, 12:07 PM

## 2019-10-10 NOTE — Telephone Encounter (Signed)
Pt currently admitted. TOC call for 3/25

## 2019-10-10 NOTE — Progress Notes (Signed)
[facility] Progress Note  Name: [name] MRN: [id number] DOB: [birth year]  Chief Complaint:  Leg swelling  Subjective/Interval event: I/Os yesterday 0.6/1.6 without diuretic. Says she feels globally better than presentation, less edema, less dyspnea, stronger.  Wt from 82.7 peak to 78.9kg this AM.  Cr 3.10 this AM.    Intake/Output Summary (Last 24 hours) at 10/10/2019 0940 Last data filed at 10/10/2019 0907 Gross per 24 hour  Intake 360 ml  Output 1600 ml  Net -1240 ml    Vitals:  Vitals:   10/09/19 1646 10/09/19 1944 10/09/19 2100 10/10/19 0556  BP: (!) 152/77 (!) 136/59  (!) 131/55  Pulse: 70 94  90  Resp: 17 20  18   Temp: 98.2 F (36.8 C) 98 F (36.7 C)  98.7 F (37.1 C)  TempSrc: Oral Oral  Oral  SpO2: 93% 94% 95% 94%  Weight:    79 kg  Height:         Physical Exam:  General NAD   HEENT EOMI Neck +JVD Lungs normal WOB, dec BS bases, no crackles or wheezes Heart RRR, SEM 2/6 Abdomen soft nontender nondistended; allograft nontender Extremities trace edema lower extremities and LUE - globally improved from yesterday Psych normal mood and affect LUE AVF with thrill/bruit   Medications reviewed     Labs:  BMP Latest Ref Rng & Units 10/10/2019 10/09/2019 10/08/2019  Glucose 70 - 99 mg/dL 156(H) 125(H) 168(H)  BUN 8 - 23 mg/dL 73(H) 75(H) 75(H)  Creatinine 0.44 - 1.00 mg/dL 3.10(H) 3.28(H) 3.17(H)  BUN/Creat Ratio 12 - 28 - - -  Sodium 135 - 145 mmol/L 138 139 140  Potassium 3.5 - 5.1 mmol/L 3.1(L) 3.0(L) 3.6  Chloride 98 - 111 mmol/L 96(L) 95(L) 99  CO2 22 - 32 mmol/L 31 34(H) 28  Calcium 8.9 - 10.3 mg/dL 9.4 9.7 9.5     Assessment/Plan:   # Acute on chronic diastolic CHF  -  Held diuresis yesterday in setting of rising cr - improved today so torsemide 100 daily rx'd -  She's overall feeling improved and looks better.  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   # Renal transplant  - on prednisone and prograf--> no antimetabolite d/t h/o lung Ca  - Has been on dapsone prophylaxis - prograf 5.4, maintain current dose - s/p eval with [facility] per primary nephrologist and not felt to need transplant biopsy at that time esp with history of lung Ca; A1cs 9.0, thought more likely to have DN   # Hypertension   - With overload  - diuretics as above - normotensive with hydralazine 25 TID  # Chronic hypoxic resp failure - h/o lung cancer treated with XRT only--Ia (T1b, N0, M0) non-small cell lung cancer, well-differentiated adenocarcinoma with biopsy-proven right upper lobe pulmonary nodule diagnosed in May 2019.  - on supplemental oxygen   # Anemia - normocytic, Hb in 10-11s currently  Dispo:  Likely home tomorrow with close follow up.   Justin Mend, MD 10/10/2019 9:40 AM

## 2019-10-11 LAB — GLUCOSE, CAPILLARY
Glucose-Capillary: 156 mg/dL — ABNORMAL HIGH (ref 70–99)
Glucose-Capillary: 189 mg/dL — ABNORMAL HIGH (ref 70–99)

## 2019-10-11 LAB — PROTIME-INR
INR: 2.5 — ABNORMAL HIGH (ref 0.8–1.2)
Prothrombin Time: 27.2 seconds — ABNORMAL HIGH (ref 11.4–15.2)

## 2019-10-11 LAB — BASIC METABOLIC PANEL
Anion gap: 12 (ref 5–15)
BUN: 75 mg/dL — ABNORMAL HIGH (ref 8–23)
CO2: 32 mmol/L (ref 22–32)
Calcium: 9.9 mg/dL (ref 8.9–10.3)
Chloride: 95 mmol/L — ABNORMAL LOW (ref 98–111)
Creatinine, Ser: 3.09 mg/dL — ABNORMAL HIGH (ref 0.44–1.00)
GFR calc Af Amer: 16 mL/min — ABNORMAL LOW (ref >60)
GFR calc non Af Amer: 14 mL/min — ABNORMAL LOW (ref >60)
Glucose, Bld: 115 mg/dL — ABNORMAL HIGH (ref 70–99)
Potassium: 3.6 mmol/L (ref 3.5–5.1)
Sodium: 139 mmol/L (ref 135–145)

## 2019-10-11 LAB — MAGNESIUM: Magnesium: 2.2 mg/dL (ref 1.7–2.4)

## 2019-10-11 MED ORDER — TORSEMIDE 20 MG PO TABS: 60.0000 mg | ORAL_TABLET | Freq: Every day | ORAL | 0 refills | Status: DC

## 2019-10-11 MED ORDER — TORSEMIDE 20 MG PO TABS: 60.0000 mg | ORAL_TABLET | Freq: Every day | ORAL | Status: DC

## 2019-10-11 MED ORDER — HYDRALAZINE HCL 25 MG PO TABS: 25.0000 mg | ORAL_TABLET | Freq: Three times a day (TID) | ORAL | 1 refills | Status: DC

## 2019-10-11 MED ORDER — WARFARIN SODIUM 5 MG PO TABS: 2.5000 mg | ORAL_TABLET | Freq: Every day | ORAL | Status: DC

## 2019-10-11 MED ORDER — WARFARIN SODIUM 5 MG PO TABS: 5.0000 mg | ORAL_TABLET | Freq: Once | ORAL | Status: DC

## 2019-10-11 NOTE — Progress Notes (Signed)
Brief cardiology follow up note: Brisk urine output on 100 mg oral torsemide. Agree with Dr. Johnney Ou, 60 mg daily torsemide may be best dose for home. Changed orders. No other changes, see sign off note yesterday. Buford Dresser, MD, PhD [facility]  [address] [place],  [address] [phone]

## 2019-10-11 NOTE — Progress Notes (Signed)
ANTICOAGULATION CONSULT NOTE - Follow-Up Consult  Pharmacy Consult for Warfarin  Indication: atrial fibrillation  Patient Measurements: Height: 5' (152.4 cm) Weight: 171 lb 3.2 oz (77.7 kg) IBW/kg (Calculated) : 45.5  Vital Signs: Temp: 98.2 F (36.8 C) (03/25 0455) Temp Source: Oral (03/25 0455) BP: 155/109 (03/25 0626) Pulse Rate: 75 (03/25 0626)  Labs: Recent Labs    10/09/19 0417 10/10/19 0444 10/11/19 0428  HGB 11.0*  --   --   HCT 36.0  --   --   PLT 238  --   --   LABPROT 28.2* 26.3* 27.2*  INR 2.7* 2.4* 2.5*  CREATININE 3.28* 3.10* 3.09*    Estimated Creatinine Clearance: 14.5 mL/min (A) (by C-G formula based on SCr of 3.09 mg/dL (H)).   Assessment: 74 y/o F with chief complaint of bilateral lower extremity swelling, on warfarin PTA for hx Afib/CVA (CHADS2VASc = 9).  Warfarin regimen per outpatient anti-coag notes: 2.5 mg on Mondays, 5 mg all other days  INR remains in goal range at 2.5. Eating 100% of meals. H/H, plt stable (last 3/23). No active bleed issues reported.  Goal of Therapy:  INR 2-3 Monitor platelets by anticoagulation protocol: Yes   Plan:  Warfarin 5mg  PO x 1 Monitor daily INR, CBC, s/sx bleeding   Arturo Morton, PharmD, BCPS Please check AMION for all [place] contact numbers Clinical Pharmacist 10/11/2019 9:36 AM

## 2019-10-11 NOTE — Discharge Summary (Signed)
. Physician Discharge Summary  [name] HWE:[id number] DOB: 02-07-1946 DOA: 10/01/2019  PCP: [name], MD  Admit date: 10/01/2019 Discharge date: 10/11/2019  Admitted From: Home Disposition:  Discharged to home.   Recommendations for Outpatient Follow-up:  1. Follow up with PCP in 5 - 7 days 2. Please obtain BMP/CBC in one week  Discharge Condition: Stable  CODE STATUS: FULL   Brief/Interim Summary: 74 year old female with a history of ESRD s/p cadaveric transplant in 2012, CAD with RCA stent in the past, hypertension admitted with shortness of breath, generalized weakness. BNP found to be elevated 2000, creatinine was up from baseline 1.8-2.9. Patient mated with decompensated diastolic heart failure. Nephrology was consulted for help with diuresis considering patient's history of cadaveric transplant 2012, CKD stage IIIb 4.  Upon admission patient was gently diuresed during the hospitalization, renal function slowly improved.  Nephrology team was following the patient.  Cardiology transition diuretics to oral torsemide.  3/25: She is doing exercises in bed this AM. She denies complaints. She is eager to go home. She is on her baseline 3L [place] use. She is tolerating torsemide. We will d/c to home.   Discharge Diagnoses:  Principal Problem:   Acute on chronic diastolic CHF (congestive heart failure) (HCC) Active Problems:   Type 2 diabetes mellitus with renal manifestations (HCC)   AF (paroxysmal atrial fibrillation) (HCC)   Renal transplant recipient   S/P aortic valve replacement with bioprosthetic valve and maze procedure   Chronic respiratory failure with hypoxia (HCC)   Adenocarcinoma of lung, stage 1, right (HCC)   Hypertension associated with diabetes ([place])   Heart failure (HCC)  Acute diastolic congestive heart failure with preserved ejection fraction, class III     - She has responded well to IV diuretic dosing and Zaroxolyn.       - torsemide 100 mg daily  daily, monitor response.     - Echo shows EF of 60 to 65%.     - stable SCr, now on home level of O2 support (3L [place]); ok for discharge  Left upper extremity swelling     - Doppler is negative for DVT.  Acute kidney injury on CKD stage IIIb History of cadaveric renal transplant 2012     - Continue home immunosuppressants.       - Renal function stable this AM.     - continue torsemide at discharge  Essential hypertension     - continue hydralazine, metoprolol, torsemide at discharge  Diabetes mellitus type 2     - On sliding scale and Lantus.  Lantus 30 units daily     - continue home regimen at discharge  Hyperlipidemia     - Crestor  Hypothyroidism     - Synthroid.  Chronic hypoxia secondary to prior history of lung cancer being treated with radiation     - Supplemental oxygen. Her baseline use is 3L [place]  Discharge Instructions   Allergies as of 10/11/2019      Reactions   Ibuprofen Nausea And Vomiting   Sulfamethoxazole-trimethoprim Itching, Swelling, Rash   Swelling of the face   Sulfonamide Derivatives Itching, Swelling, Rash   Swelling of the face   Tape Rash   Paper tape is ok   Tramadol Nausea And Vomiting   Doxycycline Nausea Only   Hydrocil [psyllium] Nausea And Vomiting   Bactrim Itching, Swelling, Rash   Red Dye Itching, Rash      Medication List    STOP taking these medications  HYDROcodone-acetaminophen 5-325 MG tablet Commonly known as: NORCO/VICODIN   spironolactone 25 MG tablet Commonly known as: ALDACTONE     TAKE these medications   acetaminophen 500 MG tablet Commonly known as: TYLENOL Take 500 mg by mouth every 6 (six) hours as needed for mild pain or headache.   amoxicillin 500 MG capsule Commonly known as: AMOXIL TAKE 4 CAPSULES BY MOUTH 1 HOUR PRIOR TO DENTAL WORK What changed: See the new instructions.   dapsone 25 MG tablet Take 25 mg by mouth daily.   hydrALAZINE 25 MG tablet Commonly known as: APRESOLINE Take 1  tablet (25 mg total) by mouth 3 (three) times daily.   Insulin Pen Needle 31G X 5 MM Misc Commonly known as: B-D UF III MINI PEN NEEDLES USE TO ADMINISTER INSULIN FOUR TIMES A DAY DX E11.9   levothyroxine 25 MCG tablet Commonly known as: SYNTHROID TAKE 2 TABLETS (50 MCG TOTAL) BY MOUTH DAILY. What changed: when to take this   metoprolol tartrate 25 MG tablet Commonly known as: LOPRESSOR Take 0.5 tablets (12.5 mg total) by mouth 2 (two) times daily.   OneTouch Delica Lancets 24M Misc Use to check blood sugars three times a day DX E11.9   OneTouch Verio test strip Generic drug: glucose blood USE TO TEST 3 TIMES DAILY. DX E11.9 What changed: See the new instructions.   OXYGEN Inhale 3 L into the lungs as needed (for shortness of breath).   potassium chloride SA 20 MEQ tablet Commonly known as: Klor-Con M20 Take 1 tablet (20 mEq total) by mouth daily.   predniSONE 5 MG tablet Commonly known as: DELTASONE Take 5 mg by mouth daily.   rosuvastatin 20 MG tablet Commonly known as: CRESTOR Take 1 tablet (20 mg total) by mouth daily. What changed: when to take this   Systane Balance 0.6 % Soln Generic drug: Propylene Glycol Place 1-2 drops into both eyes 3 (three) times daily as needed (for dryness).   tacrolimus 1 MG capsule Commonly known as: PROGRAF Take 3 mg by mouth 2 (two) times daily.   torsemide 20 MG tablet Commonly known as: DEMADEX Take 3 tablets (60 mg total) by mouth daily. Start taking on: October 12, 2019   Toujeo SoloStar 300 UNIT/ML Solostar Pen Generic drug: insulin glargine (1 Unit Dial) Inject 24 Units into the skin every morning. Titrate up by 1 unit a day if needed for goal sugars of 100-130 up to 30 units a day max   warfarin 5 MG tablet Commonly known as: COUMADIN Take as directed. If you are unsure how to take this medication, talk to your nurse or doctor. Original instructions: Take 0.5-1 tablets (2.5-5 mg total) by mouth daily at 6 PM. 1/2  tablet on Mondays, 1 tablet all other days      Follow-up Information    [facility] Follow up.   Why: Home Health Nurse, Nurse Aide Contact information: [place] [address] [phone]        Abigail Butts., PA-C Follow up.   Specialty: Physician Assistant Why: [facility] location - October 17, 2019 at 9:15 AM. Please arrive 15 minutes prior to appointment time to check in. Daleen Snook is one of the PAs that works closely with Dr. [name] (you have met before). Contact information: [address] [place] [place] [address] [phone]          Allergies  Allergen Reactions  . Ibuprofen Nausea And Vomiting  . Sulfamethoxazole-Trimethoprim  Itching, Swelling and Rash    Swelling of the face  . Sulfonamide Derivatives Itching, Swelling and Rash    Swelling of the face  . Tape Rash    Paper tape is ok  . Tramadol Nausea And Vomiting  . Doxycycline Nausea Only  . Hydrocil [Psyllium] Nausea And Vomiting  . Bactrim Itching, Swelling and Rash  . Red Dye Itching and Rash    Consultations:  Cardiology  Neporhology   Procedures/Studies: DG Chest Portable 1 View  Result Date: 10/01/2019 CLINICAL DATA:  74 year old female with shortness of breath. EXAM: PORTABLE CHEST 1 VIEW COMPARISON:  Chest radiograph dated 12/22/2018. FINDINGS: There is stable cardiomegaly. Left atrial appendage occlusive device, median sternotomy wires, and mechanical aortic valve noted. No significant congestion. No focal consolidation, pleural effusion, pneumothorax. Linear and streaky density in the right upper lobe similar to prior CT and radiograph of 12/22/2018 likely represents scarring or post radiation changes. There is atherosclerotic calcification of the aorta. No acute osseous pathology. IMPRESSION: 1. No acute cardiopulmonary process. 2. Stable cardiomegaly. Electronically Signed   By: Anner Crete M.D.   On: 10/01/2019 20:21    ECHOCARDIOGRAM COMPLETE  Result Date: 10/02/2019    ECHOCARDIOGRAM REPORT   Patient Name:   [name] Date of Exam: 10/02/2019 Medical Rec #:  [id number]    Height:       60.0 in Accession #:    [id number]   Weight:       180.0 lb Date of Birth:  08/11/1945    BSA:          1.785 m Patient Age:    23 years     BP:           179/99 mmHg Patient Gender: F            HR:           67 bpm. Exam Location:  Inpatient Procedure: 2D Echo, Cardiac Doppler and Color Doppler Indications:    CHF-Acute Diastolic 621.30  History:        Patient has prior history of Echocardiogram examinations, most                 recent 12/20/2018. Previous Myocardial Infarction, COPD, Aortic                 Valve Disease, Arrythmias:Atrial Fibrillation; Risk                 Factors:Hypertension, Diabetes, Dyslipidemia and Former Smoker.                 GERD.                 Aortic Valve: 23 mm Edwards bovine valve is present in the                 aortic position. Procedure Date: 04/12/2013.  Sonographer:    Vickie Epley RDCS Referring Phys: [place]  1. Left ventricular ejection fraction, by estimation, is 60 to 65%. The left ventricle has low normal function. The left ventricle has no regional wall motion abnormalities. There is mild left ventricular hypertrophy. Left ventricular diastolic function  could not be evaluated. Left ventricular diastolic function could not be evaluated. Elevated left ventricular end-diastolic pressure.  2. Right ventricular systolic function is normal. The right ventricular size is normal. There is severely elevated pulmonary artery systolic pressure. The estimated right ventricular systolic pressure is 86.5 mmHg.  3. Left atrial size was  moderately dilated.  4. Right atrial size was mildly dilated.  5. The mitral valve is abnormal. Mild to moderate mitral valve regurgitation.  6. Tricuspid valve regurgitation is mild to moderate.  7. The aortic valve has been repaired/replaced. Aortic  valve regurgitation is trivial. There is a 23 mm Edwards bovine valve present in the aortic position. Procedure Date: 04/12/2013. Aortic valve area, by VTI measures 1.66 cm. Aortic valve mean gradient measures 13.0 mmHg. Aortic valve Vmax measures 2.72 m/s.  8. The inferior vena cava is dilated in size with <50% respiratory variability, suggesting right atrial pressure of 15 mmHg. Comparison(s): Changes from prior study are noted. 12/20/2018: LVEF 60-65%, RVSP 44 mmHg. FINDINGS  Left Ventricle: Left ventricular ejection fraction, by estimation, is 60 to 65%. The left ventricle has low normal function. The left ventricle has no regional wall motion abnormalities. The left ventricular internal cavity size was normal in size. There is mild left ventricular hypertrophy. Left ventricular diastolic function could not be evaluated due to atrial fibrillation. Left ventricular diastolic function could not be evaluated. Elevated left ventricular end-diastolic pressure. Right Ventricle: The right ventricular size is normal. No increase in right ventricular wall thickness. Right ventricular systolic function is normal. There is severely elevated pulmonary artery systolic pressure. The tricuspid regurgitant velocity is 4.12 m/s, and with an assumed right atrial pressure of 15 mmHg, the estimated right ventricular systolic pressure is 37.0 mmHg. Left Atrium: Left atrial size was moderately dilated. Right Atrium: Right atrial size was mildly dilated. Pericardium: There is no evidence of pericardial effusion. Mitral Valve: The mitral valve is abnormal. There is mild thickening of the mitral valve leaflet(s). There is mild calcification of the mitral valve leaflet(s). Moderate mitral annular calcification. Mild to moderate mitral valve regurgitation. Tricuspid Valve: The tricuspid valve is grossly normal. Tricuspid valve regurgitation is mild to moderate. Aortic Valve: The aortic valve has been repaired/replaced. Aortic valve  regurgitation is trivial. Aortic valve mean gradient measures 13.0 mmHg. Aortic valve peak gradient measures 29.7 mmHg. Aortic valve area, by VTI measures 1.66 cm. There is a 23 mm Edwards bovine valve present in the aortic position. Procedure Date: 04/12/2013. Pulmonic Valve: The pulmonic valve was normal in structure. Pulmonic valve regurgitation is trivial. Aorta: The aortic root, ascending aorta, aortic arch and descending aorta are all structurally normal, with no evidence of dilitation or obstruction. Venous: The inferior vena cava is dilated in size with less than 50% respiratory variability, suggesting right atrial pressure of 15 mmHg. IAS/Shunts: No atrial level shunt detected by color flow Doppler.  LEFT VENTRICLE PLAX 2D LVIDd:         5.28 cm LVIDs:         4.19 cm LV PW:         1.03 cm LV IVS:        1.02 cm LVOT diam:     2.20 cm LV SV:         82 LV SV Index:   46 LVOT Area:     3.80 cm  LV Volumes (MOD) LV vol d, MOD A2C: 145.0 ml LV vol d, MOD A4C: 181.0 ml LV vol s, MOD A2C: 75.6 ml LV vol s, MOD A4C: 83.7 ml LV SV MOD A2C:     69.4 ml LV SV MOD A4C:     181.0 ml LV SV MOD BP:      90.9 ml RIGHT VENTRICLE TAPSE (M-mode): 1.2 cm LEFT ATRIUM  Index       RIGHT ATRIUM           Index LA diam:        5.00 cm 2.80 cm/m  RA Area:     20.90 cm LA Vol (A2C):   63.6 ml 35.63 ml/m RA Volume:   66.00 ml  36.98 ml/m LA Vol (A4C):   79.0 ml 44.26 ml/m LA Biplane Vol: 71.2 ml 39.89 ml/m  AORTIC VALVE AV Area (Vmax):    1.41 cm AV Area (Vmean):   1.34 cm AV Area (VTI):     1.66 cm AV Vmax:           272.50 cm/s AV Vmean:          156.000 cm/s AV VTI:            0.490 m AV Peak Grad:      29.7 mmHg AV Mean Grad:      13.0 mmHg LVOT Vmax:         101.00 cm/s LVOT Vmean:        55.100 cm/s LVOT VTI:          0.215 m LVOT/AV VTI ratio: 0.44  AORTA Ao Root diam: 2.70 cm TRICUSPID VALVE TR Peak grad:   67.9 mmHg TR Vmax:        412.00 cm/s  SHUNTS Systemic VTI:  0.21 m Systemic Diam: 2.20 cm  Lyman Bishop MD Electronically signed by Lyman Bishop MD Signature Date/Time: 10/02/2019/11:17:44 AM    Final    VAS [place] UPPER EXTREMITY VENOUS DUPLEX  Result Date: 10/04/2019 UPPER VENOUS STUDY  Indications: Swelling Other Indications: AVF in L arm, swelling not relieved by Lasix. Comparison Study: 12/20/18 Performing Technologist: Abram Sander RVS  Examination Guidelines: A complete evaluation includes B-mode imaging, spectral Doppler, color Doppler, and power Doppler as needed of all accessible portions of each vessel. Bilateral testing is considered an integral part of a complete examination. Limited examinations for reoccurring indications may be performed as noted.  Left Findings: +----------+------------+---------+-----------+----------+-------+ LEFT      CompressiblePhasicitySpontaneousPropertiesSummary +----------+------------+---------+-----------+----------+-------+ IJV           Full       Yes       Yes                      +----------+------------+---------+-----------+----------+-------+ Subclavian    Full       Yes       Yes                      +----------+------------+---------+-----------+----------+-------+ Axillary      Full       Yes       Yes                      +----------+------------+---------+-----------+----------+-------+ Brachial      Full       Yes       Yes                      +----------+------------+---------+-----------+----------+-------+ Radial        Full                                          +----------+------------+---------+-----------+----------+-------+ Ulnar         Full                                          +----------+------------+---------+-----------+----------+-------+  Cephalic      Full                                          +----------+------------+---------+-----------+----------+-------+ Basilic                                               AVF    +----------+------------+---------+-----------+----------+-------+  Summary:  Left: No evidence of deep vein thrombosis in the upper extremity. No evidence of superficial vein thrombosis in the upper extremity.  *See table(s) above for measurements and observations.  Diagnosing physician: Servando Snare MD Electronically signed by Servando Snare MD on 10/04/2019 at 1:55:03 PM.    Final     Subjective: "I feel really good."  Discharge Exam: Vitals:   10/11/19 0626 10/11/19 1146  BP: (!) 155/109 (!) 155/85  Pulse: 75 (!) 57  Resp:  18  Temp:  98.7 F (37.1 C)  SpO2:  (!) 85%   Vitals:   10/11/19 0450 10/11/19 0455 10/11/19 0626 10/11/19 1146  BP:  (!) 176/90 (!) 155/109 (!) 155/85  Pulse:  75 75 (!) 57  Resp:  18  18  Temp:  98.2 F (36.8 C)  98.7 F (37.1 C)  TempSrc:  Oral  Oral  SpO2:  100%  (!) 85%  Weight: 77.7 kg     Height:        General: 74 y.o. female resting in bed in NAD Cardiovascular: RRR, +S1, S2, no m/g/r Respiratory: CTABL, no w/r/r, normal WOB GI: BS+, NDNT, no masses noted MSK: No e/c/c Neuro: A&O x 3, no focal deficits Psyc: Appropriate interaction and affect, calm/cooperative     The results of significant diagnostics from this hospitalization (including imaging, microbiology, ancillary and laboratory) are listed below for reference.     Microbiology: Recent Results (from the past 240 hour(s))  SARS CORONAVIRUS 2 (TAT 6-24 HRS) Nasopharyngeal Nasopharyngeal Swab     Status: None   Collection Time: 10/01/19  9:29 PM   Specimen: Nasopharyngeal Swab  Result Value Ref Range Status   SARS Coronavirus 2 NEGATIVE NEGATIVE Final    Comment: (NOTE) SARS-CoV-2 target nucleic acids are NOT DETECTED. The SARS-CoV-2 RNA is generally detectable in upper and lower respiratory specimens during the acute phase of infection. Negative results do not preclude SARS-CoV-2 infection, do not rule out co-infections with other pathogens, and should not be used as the sole  basis for treatment or other patient management decisions. Negative results must be combined with clinical observations, patient history, and epidemiological information. The expected result is Negative. Fact Sheet for Patients: SugarRoll.be Fact Sheet for Healthcare Providers: https://www.woods-mathews.com/ This test is not yet approved or cleared by the [place] FDA and  has been authorized for detection and/or diagnosis of SARS-CoV-2 by FDA under an Emergency Use Authorization (EUA). This EUA will remain  in effect (meaning this test can be used) for the duration of the COVID-19 declaration under Section 56 4(b)(1) of the Act, 21 U.S.C. section 360bbb-3(b)(1), unless the authorization is terminated or revoked sooner. Performed at [facility], [place] [address] [place], [place] [address]   Culture, Urine     Status: Abnormal   Collection Time: 10/02/19 12:25 AM   Specimen: Urine, Clean Catch  Result Value Ref Range  Status   Specimen Description URINE, CLEAN CATCH  Final   Special Requests NONE  Final   Culture (A)  Final    <10,000 COLONIES/mL INSIGNIFICANT GROWTH Performed at [facility], [address], [place], [place] [address]    Report Status 10/03/2019 FINAL  Final     Labs: BNP (last 3 results) Recent Labs    12/19/18 1621 12/23/18 0444 10/01/19 1724  BNP 1,637.0* 1,655.2* 9,528.4*   Basic Metabolic Panel: Recent Labs  Lab 10/07/19 0409 10/08/19 0508 10/09/19 0417 10/10/19 0444 10/11/19 0428  NA 142 140 139 138 139  K 3.5 3.6 3.0* 3.1* 3.6  CL 101 99 95* 96* 95*  CO2 27 28 34* 31 32  GLUCOSE 147* 168* 125* 156* 115*  BUN 68* 75* 75* 73* 75*  CREATININE 2.84* 3.17* 3.28* 3.10* 3.09*  CALCIUM 9.9 9.5 9.7 9.4 9.9  MG  --   --   --   --  2.2   Liver Function Tests: No results for input(s): AST, ALT, ALKPHOS, BILITOT, PROT, ALBUMIN in the last 168 hours. No results for input(s): LIPASE, AMYLASE  in the last 168 hours. No results for input(s): AMMONIA in the last 168 hours. CBC: Recent Labs  Lab 10/08/19 0508 10/09/19 0417  WBC 6.5 6.8  HGB 10.7* 11.0*  HCT 36.0 36.0  MCV 85.3 84.1  PLT 226 238   Cardiac Enzymes: No results for input(s): CKTOTAL, CKMB, CKMBINDEX, TROPONINI in the last 168 hours. BNP: Invalid input(s): POCBNP CBG: Recent Labs  Lab 10/10/19 1200 10/10/19 1650 10/10/19 2118 10/11/19 0619 10/11/19 1145  GLUCAP 163* 167* 271* 156* 189*   D-Dimer No results for input(s): DDIMER in the last 72 hours. Hgb A1c No results for input(s): HGBA1C in the last 72 hours. Lipid Profile No results for input(s): CHOL, HDL, LDLCALC, TRIG, CHOLHDL, LDLDIRECT in the last 72 hours. Thyroid function studies No results for input(s): TSH, T4TOTAL, T3FREE, THYROIDAB in the last 72 hours.  Invalid input(s): FREET3 Anemia work up No results for input(s): VITAMINB12, FOLATE, FERRITIN, TIBC, IRON, RETICCTPCT in the last 72 hours. Urinalysis    Component Value Date/Time   COLORURINE STRAW (A) 10/08/2019 1246   APPEARANCEUR CLEAR 10/08/2019 1246   LABSPEC 1.006 10/08/2019 1246   PHURINE 6.0 10/08/2019 1246   GLUCOSEU NEGATIVE 10/08/2019 1246   GLUCOSEU 100 (A) 07/02/2016 1440   HGBUR SMALL (A) 10/08/2019 1246   BILIRUBINUR NEGATIVE 10/08/2019 1246   KETONESUR NEGATIVE 10/08/2019 1246   PROTEINUR 100 (A) 10/08/2019 1246   UROBILINOGEN 0.2 07/02/2016 1440   NITRITE NEGATIVE 10/08/2019 1246   LEUKOCYTESUR NEGATIVE 10/08/2019 1246   Sepsis Labs Invalid input(s): PROCALCITONIN,  WBC,  LACTICIDVEN Microbiology Recent Results (from the past 240 hour(s))  SARS CORONAVIRUS 2 (TAT 6-24 HRS) Nasopharyngeal Nasopharyngeal Swab     Status: None   Collection Time: 10/01/19  9:29 PM   Specimen: Nasopharyngeal Swab  Result Value Ref Range Status   SARS Coronavirus 2 NEGATIVE NEGATIVE Final    Comment: (NOTE) SARS-CoV-2 target nucleic acids are NOT DETECTED. The SARS-CoV-2  RNA is generally detectable in upper and lower respiratory specimens during the acute phase of infection. Negative results do not preclude SARS-CoV-2 infection, do not rule out co-infections with other pathogens, and should not be used as the sole basis for treatment or other patient management decisions. Negative results must be combined with clinical observations, patient history, and epidemiological information. The expected result is Negative. Fact Sheet for Patients: SugarRoll.be Fact Sheet for  Healthcare Providers: https://www.woods-mathews.com/ This test is not yet approved or cleared by the [place] and  has been authorized for detection and/or diagnosis of SARS-CoV-2 by FDA under an Emergency Use Authorization (EUA). This EUA will remain  in effect (meaning this test can be used) for the duration of the COVID-19 declaration under Section 56 4(b)(1) of the Act, 21 U.S.C. section 360bbb-3(b)(1), unless the authorization is terminated or revoked sooner. Performed at [facility], [place] [address] [place], [place] [address]   Culture, Urine     Status: Abnormal   Collection Time: 10/02/19 12:25 AM   Specimen: Urine, Clean Catch  Result Value Ref Range Status   Specimen Description URINE, CLEAN CATCH  Final   Special Requests NONE  Final   Culture (A)  Final    <10,000 COLONIES/mL INSIGNIFICANT GROWTH Performed at [facility], [place] [address] [place], [place] [address]    Report Status 10/03/2019 FINAL  Final     Time coordinating discharge: 35 minutes  SIGNED:   Jonnie Finner, DO  [facility] 10/11/2019, 12:28 PM   If 7PM-7AM, please contact night-coverage [email or web address]

## 2019-10-11 NOTE — Progress Notes (Signed)
[facility] Progress Note  Name: [name] MRN: [id number] DOB: 02/05/1946  Chief Complaint:  Leg swelling  Subjective/Interval event: I/Os yesterday 0.4 / 3.65 with torsemide 100.  Cr stable this AM.  She feels well and wants to d/c.     Intake/Output Summary (Last 24 hours) at 10/11/2019 1358 Last data filed at 10/11/2019 0900 Gross per 24 hour  Intake 360 ml  Output 3850 ml  Net -3490 ml    Vitals:  Vitals:   10/11/19 0450 10/11/19 0455 10/11/19 0626 10/11/19 1146  BP:  (!) 176/90 (!) 155/109 (!) 155/85  Pulse:  75 75 (!) 57  Resp:  18  18  Temp:  98.2 F (36.8 C)  98.7 F (37.1 C)  TempSrc:  Oral  Oral  SpO2:  100%  (!) 85%  Weight: 77.7 kg     Height:         Physical Exam:  General NAD   HEENT EOMI Neck +JVD Lungs normal WOB, dec BS bases, no crackles or wheezes Heart RRR, SEM 2/6 Abdomen soft nontender nondistended; allograft nontender Extremities trace edema lower extremities and LUE - globally improved from yesterday Psych normal mood and affect LUE AVF with thrill/bruit   Medications reviewed     Labs:  BMP Latest Ref Rng & Units 10/11/2019 10/10/2019 10/09/2019  Glucose 70 - 99 mg/dL 115(H) 156(H) 125(H)  BUN 8 - 23 mg/dL 75(H) 73(H) 75(H)  Creatinine 0.44 - 1.00 mg/dL 3.09(H) 3.10(H) 3.28(H)  BUN/Creat Ratio 12 - 28 - - -  Sodium 135 - 145 mmol/L 139 138 139  Potassium 3.5 - 5.1 mmol/L 3.6 3.1(L) 3.0(L)  Chloride 98 - 111 mmol/L 95(L) 96(L) 95(L)  CO2 22 - 32 mmol/L 32 31 34(H)  Calcium 8.9 - 10.3 mg/dL 9.9 9.4 9.7     Assessment/Plan:   # Acute on chronic diastolic CHF  -  Net neg 12.4P during admission - torsemide 100 yesterday produced 3.7L UOP so will d/c on torsemide 60 daily.  She can increase if needed for diuresis - discussed low na diet --> she's been using chicken bouillon for seasoning and I explained it's mostly salt.  Hopefully this will help. 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   # Renal transplant  - on prednisone and prograf--> no antimetabolite d/t h/o lung Ca  - Has been on dapsone prophylaxis - prograf 5.4, maintain current dose - s/p eval with [name] per primary nephrologist and not felt to need transplant biopsy at that time esp with history of lung Ca; A1cs 9.0, thought more likely to have DN   # Hypertension   - With overload  - diuretics as above - normotensive with hydralazine 25 TID  # Chronic hypoxic resp failure - h/o lung cancer treated with XRT only--Ia (T1b, N0, M0) non-small cell lung cancer, well-differentiated adenocarcinoma with biopsy-proven right upper lobe pulmonary nodule diagnosed in May 2019.  - on supplemental oxygen   # Anemia - normocytic, Hb in 10-11s currently  Dispo:  Likely home tomorrow with close follow up.  Dispo  - home today; She has f/u with cardiology early next week. Then PCP in a few weeks.  Has appt with Dr. Joelyn Oms at [facility] early 11/2019  which I think is fine.    Justin Mend, MD 10/11/2019 1:58 PM

## 2019-10-11 NOTE — Telephone Encounter (Signed)
Current admit 

## 2019-10-12 ENCOUNTER — Other Ambulatory Visit: Payer: Self-pay | Admitting: *Deleted

## 2019-10-12 ENCOUNTER — Other Ambulatory Visit: Payer: Self-pay

## 2019-10-12 NOTE — Patient Outreach (Signed)
[place] [facility]) Care Management  10/12/2019  [name] 08/25/1945 [id number]   RN Health Coach Discipline Closure  Referral Date: 05/31/2019 Referral Source: Transfer from [place] Reason for Referral: Continued Disease Management Education Insurance:Medicare   Outreach Attempt:  Patient has been discharged to home with home health on 10/11/2019.  [place] has requested [place] Coordinator referral for post hospital follow up.  Plan:  RN Health Coach will close Disease Management case.  RN Health Coach will send Discipline Closure Letter to primary care provider.  [place] [phone] [email or web address]

## 2019-10-12 NOTE — Telephone Encounter (Addendum)
Pt D/C'd from the hospital..   Attempt #1 for TOC call.   LMTCB on home and cell #'s

## 2019-10-14 NOTE — Progress Notes (Signed)
Cardiology Office Note   Date:  10/17/2019   ID:  [name], DOB 1945-11-28, MRN [id number]  PCP:  [name], MD  Cardiologist:  [name], MD EP: None  Chief Complaint  Patient presents with  . Hospitalization Follow-up      History of Present Illness: [name] is a 74 y.o. female with PMH of CAD s/p PCI to RCA in 2003 (occluded by cath in 2014, medically managed), severe AS s/p AVR with pericardial tissue valve and MAZE procedure in 3785, chronic diastolic CHF, HTN, HLD, carotid artery disease, DM type 2, ESRD s/p renal transplant in 2012 now with CKD stage 4, and lung cancer s/p XRT (pt declined resection) on home O2 3L at baseline, who presents for post-hospital follow-up.   She was recently admitted to the hospital from 10/01/19-10/11/19 for acute on chronic diastolic CHF. Cardiology followed throughout admission. She was aggressively diuresed with IV lasix and prn metolazone and transitioned to torsemide 60mg  daily on discharge. Her weight on the day of discharge was 171lbs. Echo this admission showed EF 60-65%, unable to assess LV diastolic function, no RWMA, moderate LAE, mild to moderate MR/TR, with stable aortic valve s/p replacement.   She presents today for post-hospital follow-up. She continues to have SOB, though this seems stable since discharge. Her LE edema has improved some but is still present. More common to have swelling in the evenings which improves with LE elevation and compression stocking use. She reported intolerance again with hydralazine use and has not been taking this since discharge from the hospital. She feels quite debilitated and is hoping to establish home health aid care in the coming week. She has no complaints of chest pain, dizziness, lightheadedness, syncope, palpitations, or bleeding with coumadin.     Past Medical History:  Diagnosis Date  . Acute CHF (congestive heart failure) ([place]) 12/2018  . Adenocarcinoma of lung,  stage 1, right ([place]) 11/25/2017  . Anemia, iron deficiency    of chronic disease  . Aortic stenosis    a. Severe AS by echo 11/2012.  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 Asystole ([place])    a. During ENT surgery 2005: developed marked asystole requiring CPR, felt due to vagal reaction (cath nonobst dz).  . Carotid artery disease ([place])    a. Carotid Dopplers performed in August 2013 showed 40-59% left stenosis and 0-39% right; f/u recommended in 2 years.   . Cerebrovascular accident [facility]) 2009   a. LMCA infarct felt embolic 8850, maintained on chronic coumadin.; denies residual on 04/05/2013  . Cholelithiasis   . Chronic Persistent Atrial Fibrillation 12/31/2008   Qualifier: Diagnosis of  By: [name]    . Coronary artery disease 05/2002   a. Ant MI 2003 s/p PTCA/stent to RCA.   . Diverticulosis of colon   . Esophagitis, reflux   . ESRD (end stage renal disease) ([place])    a. Mass on L kidney per pt s/p nephrectomy - pt states not cancer - [facility] notes indicate ESRD due to HTN/DM - was previously on HD. b. Kidney transplant 02/2011.  Marland Kitchen GERD (gastroesophageal reflux disease)   . Gout   . Hearing loss   . Helicobacter pylori (H. pylori) infection    hx of  . Hemorrhoids   . Hx of colonic polyps    adenomatous  . Hyperlipidemia   . Hypertension   . Lung nodule seen on imaging study 04/07/2013   1.0 cm ground  glass opacity RUL  . Myocardial infarction ([place]) 2003  . Pericardial effusion    a. Small by echo 11/2011.  . S/P aortic valve replacement with bioprosthetic valve and maze procedure 04/12/2013   69mm [facility] Ease bovine pericardial tissue valve   . S/P Maze operation for atrial fibrillation 04/12/2013   Complete bilateral atrial lesion set using bipolar radiofrequency and cryothermy ablation with clipping of LA appendage  . Sleep apnea    Pt says testing was positive, intolerant of CPAP.  Marland Kitchen Streptococcal infection group D enterococcus    Recurrent  Enterococcus bacteremia status post removal of infected graft on May 07, 2008, with removal of PermCath and subsequent replacement 06/2008.  . Stroke ([place])   . Type II diabetes mellitus ([place])     Past Surgical History:  Procedure Laterality Date  . AORTIC VALVE REPLACEMENT N/A 04/12/2013   Procedure: AORTIC VALVE REPLACEMENT (AVR);  Surgeon: Rexene Alberts, MD;  Location: [place];  Service: Open Heart Surgery;  Laterality: N/A;  . ARTERIOVENOUS GRAFT PLACEMENT Left   . ARTERIOVENOUS GRAFT PLACEMENT Left    "I've had 2 on my left; had one removed" (04/05/2013)   . ARTERY EXPLORATION Right 04/11/2013   Procedure: ARTERY EXPLORATION;  Surgeon: Rexene Alberts, MD;  Location: [place];  Service: Open Heart Surgery;  Laterality: Right;  Right carotid artery exploration  . AV FISTULA PLACEMENT Right   . AV FISTULA REPAIR Right    "took it out" ((/18/2014)  . CARDIOVERSION  05/29/2012   Procedure: CARDIOVERSION;  Surgeon: Lelon Perla, MD;  Location[facility] ENDOSCOPY;  Service: Cardiovascular;  Laterality: N/A;  . CHOLECYSTECTOMY  2009   with hernia removal  . CORONARY ANGIOPLASTY WITH STENT PLACEMENT Right    coronary artery  . INSERTION OF DIALYSIS CATHETER Bilateral    "over the years; took them both out" (04/05/2013)  . INTRAOPERATIVE TRANSESOPHAGEAL ECHOCARDIOGRAM N/A 04/11/2013   Procedure: INTRAOPERATIVE TRANSESOPHAGEAL ECHOCARDIOGRAM;  Surgeon: Rexene Alberts, MD;  Location: [place];  Service: Open Heart Surgery;  Laterality: N/A;  . INTRAOPERATIVE TRANSESOPHAGEAL ECHOCARDIOGRAM N/A 04/12/2013   Procedure: INTRAOPERATIVE TRANSESOPHAGEAL ECHOCARDIOGRAM;  Surgeon: Rexene Alberts, MD;  Location: [place];  Service: Open Heart Surgery;  Laterality: N/A;  . KIDNEY TRANSPLANT  03/16/11  . LEFT AND RIGHT HEART CATHETERIZATION WITH CORONARY ANGIOGRAM N/A 04/06/2013   Procedure: LEFT AND RIGHT HEART CATHETERIZATION WITH CORONARY ANGIOGRAM;  Surgeon: Blane Ohara, MD;  [facility] C[facility] LAB;  Service:  Cardiovascular;  Laterality: N/A;  . MAZE N/A 04/12/2013   Procedure: MAZE;  Surgeon: Rexene Alberts, MD;  Location: [place];  Service: Open Heart Surgery;  Laterality: N/A;  . NASAL RECONSTRUCTION WITH SEPTAL REPAIR     "took it out" (04/05/2013)  . NEPHRECTOMY Left 2010   no CA on bx  . TONSILLECTOMY    . TOTAL ABDOMINAL HYSTERECTOMY    . TUBAL LIGATION       Current Outpatient Medications  Medication Sig Dispense Refill  . acetaminophen (TYLENOL) 500 MG tablet Take 500 mg by mouth every 6 (six) hours as needed for mild pain or headache.    Marland Kitchen amLODipine (NORVASC) 5 MG tablet Take 1 tablet (5 mg total) by mouth daily. 90 tablet 1  . dapsone 25 MG tablet Take 25 mg by mouth daily.     . Insulin Glargine, 1 Unit Dial, (TOUJEO SOLOSTAR) 300 UNIT/ML SOPN Inject 24 Units into the skin every morning. Titrate up by 1 unit a day if  needed for goal sugars of 100-130 up to 30 units a day max 3 pen 11  . Insulin Pen Needle (B-D UF III MINI PEN NEEDLES) 31G X 5 MM MISC USE TO ADMINISTER INSULIN FOUR TIMES A DAY DX E11.9 100 each 1  . levothyroxine (SYNTHROID) 25 MCG tablet TAKE 2 TABLETS (50 MCG TOTAL) BY MOUTH DAILY. (Patient taking differently: Take 50 mcg by mouth daily before breakfast. ) 180 tablet 3  . metoprolol tartrate (LOPRESSOR) 25 MG tablet Take 0.5 tablets (12.5 mg total) by mouth 2 (two) times daily. 90 tablet 3  . ONETOUCH DELICA LANCETS 81O MISC Use to check blood sugars three times a day DX E11.9 100 each 5  . ONETOUCH VERIO test strip USE TO TEST 3 TIMES DAILY. DX E11.9 (Patient taking differently: 1 each by Other route 3 (three) times daily. DX: E11.9) 100 each 5  . OXYGEN Inhale 3 L into the lungs as needed (for shortness of breath).     . potassium chloride SA (KLOR-CON M20) 20 MEQ tablet Take 1 tablet (20 mEq total) by mouth daily. 30 tablet 3  . predniSONE (DELTASONE) 5 MG tablet Take 5 mg by mouth daily.   5  . Propylene Glycol (SYSTANE BALANCE) 0.6 % SOLN Place 1-2 drops into  both eyes 3 (three) times daily as needed (for dryness).    . rosuvastatin (CRESTOR) 20 MG tablet Take 1 tablet (20 mg total) by mouth daily. (Patient taking differently: Take 20 mg by mouth every other day. ) 90 tablet 3  . tacrolimus (PROGRAF) 1 MG capsule Take 3 mg by mouth 2 (two) times daily.     Marland Kitchen torsemide (DEMADEX) 20 MG tablet Take 3 tablets (60 mg total) by mouth daily. 90 tablet 0  . warfarin (COUMADIN) 5 MG tablet Take 0.5-1 tablets (2.5-5 mg total) by mouth daily at 6 PM. 1/2 tablet on Mondays, 1 tablet all other days     No current facility-administered medications for this visit.    Allergies:   Ibuprofen, Sulfamethoxazole-trimethoprim, Sulfonamide derivatives, Tape, Tramadol, Doxycycline, Hydrocil [psyllium], Bactrim, and Red dye    Social History:  The patient  reports that she quit smoking about 18 years ago. Her smoking use included cigarettes. She has a 30.00 pack-year smoking history. She has never used smokeless tobacco. She reports that she does not drink alcohol or use drugs.   Family History:  The patient's family history includes Crohn's disease in an other family member; Diabetes in her maternal grandmother, son, and another family member; Hypertension in her mother; Stroke in her father.    ROS:  Please see the history of present illness.   Otherwise, review of systems are positive for none.   All other systems are reviewed and negative.    PHYSICAL EXAM: VS:  BP (!) 144/82   Pulse 88   Ht 5' (1.524 m)   Wt 161 lb (73 kg)   SpO2 92%   BMI 31.44 kg/m  , BMI Body mass index is 31.44 kg/m. GEN: Well nourished, well developed, in no acute distress HEENT: sclera anicteric Neck: no JVD, carotid bruits, or masses Cardiac: IRIR; no murmurs, rubs, or gallops, 1-2+ LE edema  Respiratory: O2 via [place] in place, clear to auscultation bilaterally, normal work of breathing GI: soft, nontender, nondistended, + BS MS: no deformity or atrophy Skin: warm and dry, no  rash Neuro:  Strength and sensation are intact Psych: euthymic mood, full affect   EKG:  EKG is ordered today.  The ekg ordered today demonstrates atrial fibrillation with rate 88 bpm, chronic TWI in I and aVL, non-specific IVCD, no STE/D, no significant change from previous.   Recent Labs: 12/19/2018: TSH 1.645 06/15/2019: ALT 19 10/01/2019: B Natriuretic Peptide 2,092.5 10/09/2019: Hemoglobin 11.0; Platelets 238 10/11/2019: BUN 75; Creatinine, Ser 3.09; Magnesium 2.2; Potassium 3.6; Sodium 139    Lipid Panel    Component Value Date/Time   CHOL 155 03/24/2018 1510   TRIG 91 03/24/2018 1510   TRIG 121 06/13/2006 1525   HDL 64 03/24/2018 1510   CHOLHDL 2.4 03/24/2018 1510   CHOLHDL 2 11/18/2016 1354   VLDL 26.2 11/18/2016 1354   LDLCALC 73 03/24/2018 1510   LDLDIRECT 89.3 06/13/2006 1525      Wt Readings from Last 3 Encounters:  10/17/19 161 lb (73 kg)  10/11/19 171 lb 3.2 oz (77.7 kg)  09/19/19 174 lb (78.9 kg)      Other studies Reviewed: Additional studies/ records that were reviewed today include:   Echo 10/02/19 1. Left ventricular ejection fraction, by estimation, is 60 to 65%. The  left ventricle has low normal function. The left ventricle has no regional  wall motion abnormalities. There is mild left ventricular hypertrophy.  Left ventricular diastolic function  could not be evaluated. Left ventricular diastolic function could not be  evaluated. Elevated left ventricular end-diastolic pressure.  2. Right ventricular systolic function is normal. The right ventricular  size is normal. There is severely elevated pulmonary artery systolic  pressure. The estimated right ventricular systolic pressure is 16.1 mmHg.  3. Left atrial size was moderately dilated.  4. Right atrial size was mildly dilated.  5. The mitral valve is abnormal. Mild to moderate mitral valve  regurgitation.  6. Tricuspid valve regurgitation is mild to moderate.  7. The aortic valve has  been repaired/replaced. Aortic valve  regurgitation is trivial. There is a 23 mm Edwards bovine valve present in  the aortic position. Procedure Date: 04/12/2013. Aortic valve area, by VTI  measures 1.66 cm. Aortic valve mean  gradient measures 13.0 mmHg. Aortic valve Vmax measures 2.72 m/s.  8. The inferior vena cava is dilated in size with <50% respiratory  variability, suggesting right atrial pressure of 15 mmHg.   Comparison(s): Changes from prior study are noted. 12/20/2018: LVEF 60-65%,  RVSP 44 mmHg.      ASSESSMENT AND PLAN:  1. Chronic diastolic CHF: Weight is 096EAV today, down from 171lbs on discharge 3/25. She continues to have LE edema. SOB has been stable - Continue torsemide - Continue metoprolol - Continue to limit salt intake to <2g daily - Continue daily weights at home and notify the office if weight gain of 3lbs in 1 day or 5lbs in 1 week.   2. CAD s/p PCI to RCA in 2003: RCA occluded on subsequent cath in 2014. No anginal complaints. Not on aspirin given need for anticoagulation - Continue statin - Continue metoprolol  3. Aortic stenosis s/p aortic valve replacement in 2014: stable on echo during recent admission. - Continue routine monitoring  4. Paroxysmal atrial fibrillation s/p MAZE in 2014: no recurrence. No complaints of palpitations. She will have her INR checked today - Continue metoprolol for rate control - Continue coumadin for stroke ppx  5. HTN: BP 142/82 today. Noted to have prior intolerance to hydralazine, however appears she was discharged on this medication. She again reports feeling poorly on hydralazine and has not been taking it at home. SBP has typically been in the 140s -  Will discontinue hydralazine at this time - Start amlodipine 5mg  daily - Continue metoprolol and torsemide  6. CKD stage 4 with history of ESRD s/p renal transplant in 2012: Cr 3.09 on the day of discharge. - Will repeat BMET today for close monitoring - Continue to  avoid nephrotoxic agents - Continue tacrolimus - Continue routine follow-up with nephrology  She asks about establishing HHA services and will talk to her PCP to help coordinate this in the next week.    Current medicines are reviewed at length with the patient today.  The patient does not have concerns regarding medicines.  The following changes have been made:  As above  Labs/ tests ordered today include:   Orders Placed This Encounter  Procedures  . Basic Metabolic Panel (BMET)  . EKG 12-Lead     Disposition:   FU with myself or Dr. [name] in 1 month  Signed, Abigail Butts, PA-C  10/17/2019 10:01 AM

## 2019-10-15 NOTE — Telephone Encounter (Signed)
TOC #3 LM2CB; F/U APPT IS 10/17/2019 9:15 AM Daleen Snook Kroeger PA-C; AND ALSO [facility]  10/17/2019 2:15 PM

## 2019-10-16 ENCOUNTER — Other Ambulatory Visit: Payer: Self-pay

## 2019-10-16 NOTE — Patient Outreach (Signed)
New referral/ red emmi: This call was on 10/15/2019  Reason for red emmi: unfilled RX. Referral for red emmi and complex case management. MD office does transition of care.  Placed call to patient who reports she has all her medications.  Reviewed [facility] program and patient has agreed to services.   Patient reports while in hospital she lost 11 pounds of fluid. Reports she was fluid overloaded because she had fluid in her breast. Patient reports she follows her low salt diet. Reports she lives with her husband but her son and grandson help her the most. Reports she weighs daily and takes her prescriptions as prescribed. Reports she has her follow up planned and her husband will drive her to her appointments. Reports she is on 3 liter nasal canula oxygen 24/7.  Reports home health has not yet called. Reviewed with patient the services that were ordered in the hospital.  Patient reports that she walks with a walker.   PLAN: will call patient back on 10/18/2019 to confirm if home health has contacted patient. Will mail new patient packet to confirmed address.  Tomasa Rand, RN, BSN, CEN [facility] [employer] [phone]

## 2019-10-17 ENCOUNTER — Other Ambulatory Visit: Payer: Self-pay

## 2019-10-17 ENCOUNTER — Ambulatory Visit (INDEPENDENT_AMBULATORY_CARE_PROVIDER_SITE_OTHER): Payer: Medicare Other | Admitting: Pharmacist

## 2019-10-17 ENCOUNTER — Encounter: Payer: Self-pay | Admitting: Medical

## 2019-10-17 ENCOUNTER — Ambulatory Visit (INDEPENDENT_AMBULATORY_CARE_PROVIDER_SITE_OTHER): Payer: Medicare Other | Admitting: Medical

## 2019-10-17 VITALS — BP 144/82 | HR 88 | Ht 60.0 in | Wt 161.0 lb

## 2019-10-17 DIAGNOSIS — I480 Paroxysmal atrial fibrillation: Secondary | ICD-10-CM

## 2019-10-17 DIAGNOSIS — Z952 Presence of prosthetic heart valve: Secondary | ICD-10-CM

## 2019-10-17 DIAGNOSIS — I151 Hypertension secondary to other renal disorders: Secondary | ICD-10-CM | POA: Diagnosis not present

## 2019-10-17 DIAGNOSIS — Z5181 Encounter for therapeutic drug level monitoring: Secondary | ICD-10-CM

## 2019-10-17 DIAGNOSIS — Z940 Kidney transplant status: Secondary | ICD-10-CM

## 2019-10-17 DIAGNOSIS — I2510 Atherosclerotic heart disease of native coronary artery without angina pectoris: Secondary | ICD-10-CM

## 2019-10-17 DIAGNOSIS — I5032 Chronic diastolic (congestive) heart failure: Secondary | ICD-10-CM | POA: Diagnosis not present

## 2019-10-17 DIAGNOSIS — N184 Chronic kidney disease, stage 4 (severe): Secondary | ICD-10-CM

## 2019-10-17 LAB — PROTIME-INR
INR: 6.6 (ref 0.9–1.2)
Prothrombin Time: 70.4 s — ABNORMAL HIGH (ref 9.1–12.0)

## 2019-10-17 LAB — BASIC METABOLIC PANEL
BUN/Creatinine Ratio: 25 (ref 12–28)
BUN: 65 mg/dL — ABNORMAL HIGH (ref 8–27)
CO2: 31 mmol/L — ABNORMAL HIGH (ref 20–29)
Calcium: 10.3 mg/dL (ref 8.7–10.3)
Chloride: 93 mmol/L — ABNORMAL LOW (ref 96–106)
Creatinine, Ser: 2.56 mg/dL — ABNORMAL HIGH (ref 0.57–1.00)
GFR calc Af Amer: 20 mL/min/{1.73_m2} — ABNORMAL LOW (ref >59)
GFR calc non Af Amer: 18 mL/min/{1.73_m2} — ABNORMAL LOW (ref >59)
Glucose: 189 mg/dL — ABNORMAL HIGH (ref 65–99)
Potassium: 3.4 mmol/L — ABNORMAL LOW (ref 3.5–5.2)
Sodium: 140 mmol/L (ref 134–144)

## 2019-10-17 LAB — POCT INR: INR: 6.4 — AB (ref 2.0–3.0)

## 2019-10-17 MED ORDER — AMLODIPINE BESYLATE 5 MG PO TABS: 5.0000 mg | ORAL_TABLET | Freq: Every day | ORAL | 1 refills | Status: DC

## 2019-10-17 NOTE — Addendum Note (Signed)
Addended by: Claude Manges on: 10/17/2019 10:13 AM   Modules accepted: Orders

## 2019-10-17 NOTE — Patient Instructions (Signed)
Medication Instructions:   STOP HYDRALAZINE 25 MG   START AMLODIPINE 5 MG ONCE A DAY    *If you need a refill on your cardiac medications before your next appointment, please call your pharmacy*   Lab Work:  BMET TODAY   If you have labs (blood work) drawn today and your tests are completely normal, you will receive your results only by: Marland Kitchen MyChart Message (if you have MyChart) OR . A paper copy in the mail If you have any lab test that is abnormal or we need to change your treatment, we will call you to review the results.   Testing/Procedures: NONE ORDERED  TODAY    Follow-Up: At [facility], you and your health needs are our priority.  As part of our continuing mission to provide you with exceptional heart care, we have created designated Provider Care Teams.  These Care Teams include your primary Cardiologist (physician) and Advanced Practice Providers (APPs -  Physician Assistants and Nurse Practitioners) who all work together to provide you with the care you need, when you need it.  We recommend signing up for the patient portal called "MyChart".  Sign up information is provided on this After Visit Summary.  MyChart is used to connect with patients for Virtual Visits (Telemedicine).  Patients are able to view lab/test results, encounter notes, upcoming appointments, etc.  Non-urgent messages can be sent to your provider as well.   To learn more about what you can do with MyChart, go to [email or web address].    Your next appointment:   1 month(s)  The format for your next appointment:   In Person  Provider:   You may see [name], MD or one of the following Advanced Practice Providers on your designated Care Team:    Kerin Ransom, PA-C  [place], [place]  Coletta Memos, [place]    Other Instructions

## 2019-10-18 ENCOUNTER — Other Ambulatory Visit: Payer: Self-pay

## 2019-10-18 DIAGNOSIS — K5730 Diverticulosis of large intestine without perforation or abscess without bleeding: Secondary | ICD-10-CM | POA: Diagnosis not present

## 2019-10-18 DIAGNOSIS — E1122 Type 2 diabetes mellitus with diabetic chronic kidney disease: Secondary | ICD-10-CM | POA: Diagnosis not present

## 2019-10-18 DIAGNOSIS — Z955 Presence of coronary angioplasty implant and graft: Secondary | ICD-10-CM | POA: Diagnosis not present

## 2019-10-18 DIAGNOSIS — Z992 Dependence on renal dialysis: Secondary | ICD-10-CM | POA: Diagnosis not present

## 2019-10-18 DIAGNOSIS — M1030 Gout due to renal impairment, unspecified site: Secondary | ICD-10-CM | POA: Diagnosis not present

## 2019-10-18 DIAGNOSIS — I152 Hypertension secondary to endocrine disorders: Secondary | ICD-10-CM | POA: Diagnosis not present

## 2019-10-18 DIAGNOSIS — N390 Urinary tract infection, site not specified: Secondary | ICD-10-CM | POA: Diagnosis not present

## 2019-10-18 DIAGNOSIS — Z79899 Other long term (current) drug therapy: Secondary | ICD-10-CM

## 2019-10-18 DIAGNOSIS — Z87891 Personal history of nicotine dependence: Secondary | ICD-10-CM | POA: Diagnosis not present

## 2019-10-18 DIAGNOSIS — N186 End stage renal disease: Secondary | ICD-10-CM | POA: Diagnosis not present

## 2019-10-18 DIAGNOSIS — C3411 Malignant neoplasm of upper lobe, right bronchus or lung: Secondary | ICD-10-CM | POA: Diagnosis not present

## 2019-10-18 DIAGNOSIS — Z940 Kidney transplant status: Secondary | ICD-10-CM | POA: Diagnosis not present

## 2019-10-18 DIAGNOSIS — K8020 Calculus of gallbladder without cholecystitis without obstruction: Secondary | ICD-10-CM | POA: Diagnosis not present

## 2019-10-18 DIAGNOSIS — I5033 Acute on chronic diastolic (congestive) heart failure: Secondary | ICD-10-CM | POA: Diagnosis not present

## 2019-10-18 DIAGNOSIS — G4730 Sleep apnea, unspecified: Secondary | ICD-10-CM | POA: Diagnosis not present

## 2019-10-18 DIAGNOSIS — I4819 Other persistent atrial fibrillation: Secondary | ICD-10-CM | POA: Diagnosis not present

## 2019-10-18 DIAGNOSIS — K2100 Gastro-esophageal reflux disease with esophagitis, without bleeding: Secondary | ICD-10-CM | POA: Diagnosis not present

## 2019-10-18 DIAGNOSIS — J9611 Chronic respiratory failure with hypoxia: Secondary | ICD-10-CM | POA: Diagnosis not present

## 2019-10-18 DIAGNOSIS — H9190 Unspecified hearing loss, unspecified ear: Secondary | ICD-10-CM | POA: Diagnosis not present

## 2019-10-18 DIAGNOSIS — J449 Chronic obstructive pulmonary disease, unspecified: Secondary | ICD-10-CM | POA: Diagnosis not present

## 2019-10-18 DIAGNOSIS — I2510 Atherosclerotic heart disease of native coronary artery without angina pectoris: Secondary | ICD-10-CM | POA: Diagnosis not present

## 2019-10-18 DIAGNOSIS — D509 Iron deficiency anemia, unspecified: Secondary | ICD-10-CM | POA: Diagnosis not present

## 2019-10-18 DIAGNOSIS — Z794 Long term (current) use of insulin: Secondary | ICD-10-CM | POA: Diagnosis not present

## 2019-10-18 DIAGNOSIS — Z7901 Long term (current) use of anticoagulants: Secondary | ICD-10-CM | POA: Diagnosis not present

## 2019-10-18 DIAGNOSIS — E785 Hyperlipidemia, unspecified: Secondary | ICD-10-CM | POA: Diagnosis not present

## 2019-10-18 DIAGNOSIS — I5032 Chronic diastolic (congestive) heart failure: Secondary | ICD-10-CM

## 2019-10-18 DIAGNOSIS — I69320 Aphasia following cerebral infarction: Secondary | ICD-10-CM | POA: Diagnosis not present

## 2019-10-18 NOTE — Patient Outreach (Signed)
Telephone assessment:  Placed call to patient for telephone follow up after MD appointment. No answer  PLAN: left message requesting a call back. If no response will attempt again in 3 days.   Tomasa Rand, RN, BSN, CEN [facility] [employer] [phone]

## 2019-10-21 DIAGNOSIS — M1030 Gout due to renal impairment, unspecified site: Secondary | ICD-10-CM | POA: Diagnosis not present

## 2019-10-21 DIAGNOSIS — I152 Hypertension secondary to endocrine disorders: Secondary | ICD-10-CM | POA: Diagnosis not present

## 2019-10-21 DIAGNOSIS — E1122 Type 2 diabetes mellitus with diabetic chronic kidney disease: Secondary | ICD-10-CM | POA: Diagnosis not present

## 2019-10-21 DIAGNOSIS — I5033 Acute on chronic diastolic (congestive) heart failure: Secondary | ICD-10-CM | POA: Diagnosis not present

## 2019-10-21 DIAGNOSIS — N186 End stage renal disease: Secondary | ICD-10-CM | POA: Diagnosis not present

## 2019-10-21 DIAGNOSIS — I2510 Atherosclerotic heart disease of native coronary artery without angina pectoris: Secondary | ICD-10-CM | POA: Diagnosis not present

## 2019-10-22 ENCOUNTER — Ambulatory Visit (INDEPENDENT_AMBULATORY_CARE_PROVIDER_SITE_OTHER): Payer: Medicare Other | Admitting: Internal Medicine

## 2019-10-22 ENCOUNTER — Encounter: Payer: Self-pay | Admitting: Internal Medicine

## 2019-10-22 ENCOUNTER — Other Ambulatory Visit: Payer: Self-pay

## 2019-10-22 DIAGNOSIS — N2889 Other specified disorders of kidney and ureter: Secondary | ICD-10-CM

## 2019-10-22 DIAGNOSIS — I480 Paroxysmal atrial fibrillation: Secondary | ICD-10-CM | POA: Diagnosis not present

## 2019-10-22 DIAGNOSIS — I151 Hypertension secondary to other renal disorders: Secondary | ICD-10-CM | POA: Diagnosis not present

## 2019-10-22 DIAGNOSIS — N189 Chronic kidney disease, unspecified: Secondary | ICD-10-CM

## 2019-10-22 DIAGNOSIS — M7989 Other specified soft tissue disorders: Secondary | ICD-10-CM | POA: Diagnosis not present

## 2019-10-22 DIAGNOSIS — I5033 Acute on chronic diastolic (congestive) heart failure: Secondary | ICD-10-CM | POA: Diagnosis not present

## 2019-10-22 DIAGNOSIS — Z8673 Personal history of transient ischemic attack (TIA), and cerebral infarction without residual deficits: Secondary | ICD-10-CM

## 2019-10-22 DIAGNOSIS — D631 Anemia in chronic kidney disease: Secondary | ICD-10-CM

## 2019-10-22 DIAGNOSIS — E1121 Type 2 diabetes mellitus with diabetic nephropathy: Secondary | ICD-10-CM

## 2019-10-22 DIAGNOSIS — Z794 Long term (current) use of insulin: Secondary | ICD-10-CM

## 2019-10-22 LAB — CBC WITH DIFFERENTIAL/PLATELET
Basophils Absolute: 0 10*3/uL (ref 0.0–0.1)
Basophils Relative: 0.5 % (ref 0.0–3.0)
Eosinophils Absolute: 0 10*3/uL (ref 0.0–0.7)
Eosinophils Relative: 0.5 % (ref 0.0–5.0)
HCT: 37.9 % (ref 36.0–46.0)
Hemoglobin: 11.9 g/dL — ABNORMAL LOW (ref 12.0–15.0)
Lymphocytes Relative: 5.1 % — ABNORMAL LOW (ref 12.0–46.0)
Lymphs Abs: 0.4 10*3/uL — ABNORMAL LOW (ref 0.7–4.0)
MCHC: 31.4 g/dL (ref 30.0–36.0)
MCV: 83.3 fl (ref 78.0–100.0)
Monocytes Absolute: 0.5 10*3/uL (ref 0.1–1.0)
Monocytes Relative: 6.9 % (ref 3.0–12.0)
Neutro Abs: 6.9 10*3/uL (ref 1.4–7.7)
Neutrophils Relative %: 87 % — ABNORMAL HIGH (ref 43.0–77.0)
Platelets: 214 10*3/uL (ref 150.0–400.0)
RBC: 4.55 Mil/uL (ref 3.87–5.11)
RDW: 19.7 % — ABNORMAL HIGH (ref 11.5–15.5)
WBC: 7.9 10*3/uL (ref 4.0–10.5)

## 2019-10-22 LAB — BASIC METABOLIC PANEL
BUN: 66 mg/dL — ABNORMAL HIGH (ref 6–23)
CO2: 31 mEq/L (ref 19–32)
Calcium: 9.8 mg/dL (ref 8.4–10.5)
Chloride: 99 mEq/L (ref 96–112)
Creatinine, Ser: 2.63 mg/dL — ABNORMAL HIGH (ref 0.40–1.20)
GFR: 21.38 mL/min — ABNORMAL LOW (ref >60.00)
Glucose, Bld: 291 mg/dL — ABNORMAL HIGH (ref 70–99)
Potassium: 4.1 mEq/L (ref 3.5–5.1)
Sodium: 139 mEq/L (ref 135–145)

## 2019-10-22 MED ORDER — GABAPENTIN 300 MG PO CAPS: 300.0000 mg | ORAL_CAPSULE | Freq: Three times a day (TID) | ORAL | 5 refills | Status: AC

## 2019-10-22 MED ORDER — HYDROCODONE-ACETAMINOPHEN 5-325 MG PO TABS: 1.0000 | ORAL_TABLET | Freq: Four times a day (QID) | ORAL | 0 refills | Status: DC | PRN

## 2019-10-22 NOTE — Addendum Note (Signed)
Addended by: Trenda Moots on: 09/17/7612 03:48 PM   Modules accepted: Orders

## 2019-10-22 NOTE — Assessment & Plan Note (Signed)
On Coumadin 

## 2019-10-22 NOTE — Assessment & Plan Note (Signed)
Labs

## 2019-10-22 NOTE — Assessment & Plan Note (Signed)
Toprol, Norvasc

## 2019-10-22 NOTE — Assessment & Plan Note (Signed)
CBC

## 2019-10-22 NOTE — Patient Instructions (Signed)
Call [place] to check if you qualify:  1 [phone]

## 2019-10-22 NOTE — Progress Notes (Signed)
Subjective:  [name], female    DOB: [birth year]  Age: 74 y.o. MRN: [id number]  CC: No chief complaint on file.   HPI [name] presents for a post-hosp f/u for CHF, CRF, DM2.  Per d/c summary: " Admit date: 10/01/2019 Discharge date: 10/11/2019  Admitted From: Home Disposition:  Discharged to home.   Recommendations for Outpatient Follow-up:  1. Follow up with PCP in 5 - 7 days 2. Please obtain BMP/CBC in one week  Discharge Condition: Stable  CODE STATUS: FULL   Brief/Interim Summary: 74 year old female with a history of ESRD s/p cadaveric transplant in 2012, CAD with RCA stent in the past, hypertension admitted with shortness of breath, generalized weakness. BNP found to be elevated 2000, creatinine was up from baseline 1.8-2.9. Patient mated with decompensated diastolic heart failure. Nephrology was consulted for help with diuresis considering patient's history of cadaveric transplant 2012, CKD stage IIIb 4.Upon admission patient was gently diuresed during the hospitalization, renal function slowly improved. Nephrology team was following the patient. Cardiology transition diuretics to oral torsemide.  3/25: She is doing exercises in bed this AM. She denies complaints. She is eager to go home. She is on her baseline 3L [place] use. She is tolerating torsemide. We will d/c to home.   Discharge Diagnoses:  Principal Problem:   Acute on chronic diastolic CHF (congestive heart failure) (HCC) Active Problems:   Type 2 diabetes mellitus with renal manifestations (HCC)   AF (paroxysmal atrial fibrillation) (HCC)   Renal transplant recipient   S/P aortic valve replacement with bioprosthetic valve and maze procedure   Chronic respiratory failure with hypoxia (HCC)   Adenocarcinoma of lung, stage 1, right (HCC)   Hypertension associated with diabetes ([place])   Heart failure (HCC)  Acute diastolic congestive heart failure with preserved ejection fraction,  class III     - She has responded well to IV diuretic dosing and Zaroxolyn.       - torsemide 100 mg daily daily, monitor response.     - Echo shows EF of 60 to 65%.     - stable SCr, now on home level of O2 support (3L [place]); ok for discharge  Left upper extremity swelling     - Doppler is negative for DVT.  Acute kidney injury on CKD stage IIIb History of cadaveric renal transplant 2012     - Continue home immunosuppressants.       - Renal function stable this AM.     - continue torsemide at discharge  Essential hypertension     - continue hydralazine, metoprolol, torsemide at discharge  Diabetes mellitus type 2     - On sliding scale and Lantus.  Lantus 30 units daily     - continue home regimen at discharge  Hyperlipidemia     - Crestor  Hypothyroidism     - Synthroid.  Chronic hypoxia secondary to prior history of lung cancer being treated with radiation     - Supplemental oxygen. Her baseline use is 3L [place]"   Outpatient Medications Prior to Visit  Medication Sig Dispense Refill  . acetaminophen (TYLENOL) 500 MG tablet Take 500 mg by mouth every 6 (six) hours as needed for mild pain or headache.    Marland Kitchen amLODipine (NORVASC) 5 MG tablet Take 1 tablet (5 mg total) by mouth daily. 90 tablet 1  . dapsone 25 MG tablet Take 25 mg by mouth daily.     . Insulin Glargine, 1 Unit Dial, (TOUJEO  SOLOSTAR) 300 UNIT/ML SOPN Inject 24 Units into the skin every morning. Titrate up by 1 unit a day if needed for goal sugars of 100-130 up to 30 units a day max 3 pen 11  . Insulin Pen Needle (B-D UF III MINI PEN NEEDLES) 31G X 5 MM MISC USE TO ADMINISTER INSULIN FOUR TIMES A DAY DX E11.9 100 each 1  . levothyroxine (SYNTHROID) 25 MCG tablet TAKE 2 TABLETS (50 MCG TOTAL) BY MOUTH DAILY. (Patient taking differently: Take 50 mcg by mouth daily before breakfast. ) 180 tablet 3  . metoprolol tartrate (LOPRESSOR) 25 MG tablet Take 0.5 tablets (12.5 mg total) by mouth 2 (two) times daily. 90  tablet 3  . ONETOUCH DELICA LANCETS 36R MISC Use to check blood sugars three times a day DX E11.9 100 each 5  . ONETOUCH VERIO test strip USE TO TEST 3 TIMES DAILY. DX E11.9 (Patient taking differently: 1 each by Other route 3 (three) times daily. DX: E11.9) 100 each 5  . OXYGEN Inhale 3 L into the lungs as needed (for shortness of breath).     . potassium chloride SA (KLOR-CON M20) 20 MEQ tablet Take 1 tablet (20 mEq total) by mouth daily. 30 tablet 3  . predniSONE (DELTASONE) 5 MG tablet Take 5 mg by mouth daily.   5  . Propylene Glycol (SYSTANE BALANCE) 0.6 % SOLN Place 1-2 drops into both eyes 3 (three) times daily as needed (for dryness).    . rosuvastatin (CRESTOR) 20 MG tablet Take 1 tablet (20 mg total) by mouth daily. (Patient taking differently: Take 20 mg by mouth every other day. ) 90 tablet 3  . tacrolimus (PROGRAF) 1 MG capsule Take 3 mg by mouth 2 (two) times daily.     Marland Kitchen torsemide (DEMADEX) 20 MG tablet Take 3 tablets (60 mg total) by mouth daily. 90 tablet 0  . warfarin (COUMADIN) 5 MG tablet Take 0.5-1 tablets (2.5-5 mg total) by mouth daily at 6 PM. 1/2 tablet on Mondays, 1 tablet all other days     No facility-administered medications prior to visit.    ROS: Review of Systems  Constitutional: Positive for fatigue. Negative for activity change, appetite change, chills and unexpected weight change.  HENT: Negative for congestion, mouth sores and sinus pressure.   Eyes: Negative for visual disturbance.  Respiratory: Negative for cough and chest tightness.   Cardiovascular: Positive for leg swelling.  Gastrointestinal: Negative for abdominal pain and nausea.  Genitourinary: Negative for difficulty urinating, frequency and vaginal pain.  Musculoskeletal: Positive for arthralgias. Negative for back pain and gait problem.  Skin: Positive for color change. Negative for pallor and rash.  Neurological: Positive for weakness and light-headedness. Negative for dizziness, tremors,  numbness and headaches.  Hematological: Bruises/bleeds easily.  Psychiatric/Behavioral: Negative for confusion and sleep disturbance.    Objective:  BP 132/86 (BP Location: Right Wrist, Patient Position: Sitting, Cuff Size: Normal)   Pulse 68   Temp 99.2 F (37.3 C) (Oral)   Ht 5' (1.524 m)   Wt 169 lb 4 oz (76.8 kg)   SpO2 94%   BMI 33.05 kg/m   BP Readings from Last 3 Encounters:  10/22/19 132/86  10/17/19 (!) 144/82  10/11/19 (!) 155/85    Wt Readings from Last 3 Encounters:  10/22/19 169 lb 4 oz (76.8 kg)  10/17/19 161 lb (73 kg)  10/11/19 171 lb 3.2 oz (77.7 kg)    Physical Exam Constitutional:      General: She is  not in acute distress.    Appearance: She is well-developed.  HENT:     Head: Normocephalic.     Right Ear: External ear normal.     Left Ear: External ear normal.     Nose: Nose normal.  Eyes:     General:        Right eye: No discharge.        Left eye: No discharge.     Conjunctiva/sclera: Conjunctivae normal.     Pupils: Pupils are equal, round, and reactive to light.  Neck:     Thyroid: No thyromegaly.     Vascular: No JVD.     Trachea: No tracheal deviation.  Cardiovascular:     Rate and Rhythm: Normal rate and regular rhythm.     Heart sounds: Normal heart sounds.  Pulmonary:     Effort: No respiratory distress.     Breath sounds: No stridor. No wheezing or rhonchi.  Chest:     Chest wall: No tenderness.  Abdominal:     General: Bowel sounds are normal. There is no distension.     Palpations: Abdomen is soft. There is no mass.     Tenderness: There is no abdominal tenderness. There is no guarding or rebound.  Musculoskeletal:        General: Swelling and tenderness present.     Cervical back: Normal range of motion and neck supple.     Right lower leg: Edema present.     Left lower leg: Edema present.  Lymphadenopathy:     Cervical: No cervical adenopathy.  Skin:    Findings: No erythema or rash.  Neurological:     Mental  Status: She is oriented to person, place, and time.     Cranial Nerves: No cranial nerve deficit.     Motor: Weakness present. No abnormal muscle tone.     Coordination: Coordination abnormal.     Gait: Gait abnormal.     Deep Tendon Reflexes: Reflexes normal.  Psychiatric:        Behavior: Behavior normal.        Thought Content: Thought content normal.        Judgment: Judgment normal.    Trace LE edema B L prox arm edema  Bruises Walker  On O2  Lab Results  Component Value Date   WBC 6.8 10/09/2019   HGB 11.0 (L) 10/09/2019   HCT 36.0 10/09/2019   PLT 238 10/09/2019   GLUCOSE 189 (H) 10/17/2019   CHOL 155 03/24/2018   TRIG 91 03/24/2018   HDL 64 03/24/2018   LDLDIRECT 89.3 06/13/2006   LDLCALC 73 03/24/2018   ALT 19 06/15/2019   AST 22 06/15/2019   NA 140 10/17/2019   K 3.4 (L) 10/17/2019   CL 93 (L) 10/17/2019   CREATININE 2.56 (H) 10/17/2019   BUN 65 (H) 10/17/2019   CO2 31 (H) 10/17/2019   TSH 1.645 12/19/2018   INR 6.6 (HH) 10/17/2019   HGBA1C 9.0 (H) 10/02/2019   MICROALBUR 317.3 (H) 07/02/2016    DG Chest Portable 1 View  Result Date: 10/01/2019 CLINICAL DATA:  74 year old female with shortness of breath. EXAM: PORTABLE CHEST 1 VIEW COMPARISON:  Chest radiograph dated 12/22/2018. FINDINGS: There is stable cardiomegaly. Left atrial appendage occlusive device, median sternotomy wires, and mechanical aortic valve noted. No significant congestion. No focal consolidation, pleural effusion, pneumothorax. Linear and streaky density in the right upper lobe similar to prior CT and radiograph of 12/22/2018 likely represents scarring or post  radiation changes. There is atherosclerotic calcification of the aorta. No acute osseous pathology. IMPRESSION: 1. No acute cardiopulmonary process. 2. Stable cardiomegaly. Electronically Signed   By: Anner Crete M.D.   On: 10/01/2019 20:21   ECHOCARDIOGRAM COMPLETE  Result Date: 10/02/2019    ECHOCARDIOGRAM REPORT   Patient  Name:   [name] Date of Exam: 10/02/2019 Medical Rec #:  [id number]    Height:       60.0 in Accession #:    [id number]   Weight:       180.0 lb Date of Birth:  Oct 14, 1945    BSA:          1.785 m Patient Age:    15 years     BP:           179/99 mmHg Patient Gender: F            HR:           67 bpm. Exam Location:  Inpatient Procedure: 2D Echo, Cardiac Doppler and Color Doppler Indications:    CHF-Acute Diastolic 716.96  History:        Patient has prior history of Echocardiogram examinations, most                 recent 12/20/2018. Previous Myocardial Infarction, COPD, Aortic                 Valve Disease, Arrythmias:Atrial Fibrillation; Risk                 Factors:Hypertension, Diabetes, Dyslipidemia and Former Smoker.                 GERD.                 Aortic Valve: 23 mm Edwards bovine valve is present in the                 aortic position. Procedure Date: 04/12/2013.  Sonographer:    Vickie Epley RDCS Referring Phys: [place]  1. Left ventricular ejection fraction, by estimation, is 60 to 65%. The left ventricle has low normal function. The left ventricle has no regional wall motion abnormalities. There is mild left ventricular hypertrophy. Left ventricular diastolic function  could not be evaluated. Left ventricular diastolic function could not be evaluated. Elevated left ventricular end-diastolic pressure.  2. Right ventricular systolic function is normal. The right ventricular size is normal. There is severely elevated pulmonary artery systolic pressure. The estimated right ventricular systolic pressure is 78.9 mmHg.  3. Left atrial size was moderately dilated.  4. Right atrial size was mildly dilated.  5. The mitral valve is abnormal. Mild to moderate mitral valve regurgitation.  6. Tricuspid valve regurgitation is mild to moderate.  7. The aortic valve has been repaired/replaced. Aortic valve regurgitation is trivial. There is a 23 mm Edwards bovine valve present in the  aortic position. Procedure Date: 04/12/2013. Aortic valve area, by VTI measures 1.66 cm. Aortic valve mean gradient measures 13.0 mmHg. Aortic valve Vmax measures 2.72 m/s.  8. The inferior vena cava is dilated in size with <50% respiratory variability, suggesting right atrial pressure of 15 mmHg. Comparison(s): Changes from prior study are noted. 12/20/2018: LVEF 60-65%, RVSP 44 mmHg. FINDINGS  Left Ventricle: Left ventricular ejection fraction, by estimation, is 60 to 65%. The left ventricle has low normal function. The left ventricle has no regional wall motion abnormalities. The left ventricular internal cavity size was normal in size. There  is mild left ventricular hypertrophy. Left ventricular diastolic function could not be evaluated due to atrial fibrillation. Left ventricular diastolic function could not be evaluated. Elevated left ventricular end-diastolic pressure. Right Ventricle: The right ventricular size is normal. No increase in right ventricular wall thickness. Right ventricular systolic function is normal. There is severely elevated pulmonary artery systolic pressure. The tricuspid regurgitant velocity is 4.12 m/s, and with an assumed right atrial pressure of 15 mmHg, the estimated right ventricular systolic pressure is 11.9 mmHg. Left Atrium: Left atrial size was moderately dilated. Right Atrium: Right atrial size was mildly dilated. Pericardium: There is no evidence of pericardial effusion. Mitral Valve: The mitral valve is abnormal. There is mild thickening of the mitral valve leaflet(s). There is mild calcification of the mitral valve leaflet(s). Moderate mitral annular calcification. Mild to moderate mitral valve regurgitation. Tricuspid Valve: The tricuspid valve is grossly normal. Tricuspid valve regurgitation is mild to moderate. Aortic Valve: The aortic valve has been repaired/replaced. Aortic valve regurgitation is trivial. Aortic valve mean gradient measures 13.0 mmHg. Aortic valve peak  gradient measures 29.7 mmHg. Aortic valve area, by VTI measures 1.66 cm. There is a 23 mm Edwards bovine valve present in the aortic position. Procedure Date: 04/12/2013. Pulmonic Valve: The pulmonic valve was normal in structure. Pulmonic valve regurgitation is trivial. Aorta: The aortic root, ascending aorta, aortic arch and descending aorta are all structurally normal, with no evidence of dilitation or obstruction. Venous: The inferior vena cava is dilated in size with less than 50% respiratory variability, suggesting right atrial pressure of 15 mmHg. IAS/Shunts: No atrial level shunt detected by color flow Doppler.  LEFT VENTRICLE PLAX 2D LVIDd:         5.28 cm LVIDs:         4.19 cm LV PW:         1.03 cm LV IVS:        1.02 cm LVOT diam:     2.20 cm LV SV:         82 LV SV Index:   46 LVOT Area:     3.80 cm  LV Volumes (MOD) LV vol d, MOD A2C: 145.0 ml LV vol d, MOD A4C: 181.0 ml LV vol s, MOD A2C: 75.6 ml LV vol s, MOD A4C: 83.7 ml LV SV MOD A2C:     69.4 ml LV SV MOD A4C:     181.0 ml LV SV MOD BP:      90.9 ml RIGHT VENTRICLE TAPSE (M-mode): 1.2 cm LEFT ATRIUM             Index       RIGHT ATRIUM           Index LA diam:        5.00 cm 2.80 cm/m  RA Area:     20.90 cm LA Vol (A2C):   63.6 ml 35.63 ml/m RA Volume:   66.00 ml  36.98 ml/m LA Vol (A4C):   79.0 ml 44.26 ml/m LA Biplane Vol: 71.2 ml 39.89 ml/m  AORTIC VALVE AV Area (Vmax):    1.41 cm AV Area (Vmean):   1.34 cm AV Area (VTI):     1.66 cm AV Vmax:           272.50 cm/s AV Vmean:          156.000 cm/s AV VTI:            0.490 m AV Peak Grad:      29.7 mmHg  AV Mean Grad:      13.0 mmHg LVOT Vmax:         101.00 cm/s LVOT Vmean:        55.100 cm/s LVOT VTI:          0.215 m LVOT/AV VTI ratio: 0.44  AORTA Ao Root diam: 2.70 cm TRICUSPID VALVE TR Peak grad:   67.9 mmHg TR Vmax:        412.00 cm/s  SHUNTS Systemic VTI:  0.21 m Systemic Diam: 2.20 cm Lyman Bishop MD Electronically signed by Lyman Bishop MD Signature Date/Time:  10/02/2019/11:17:44 AM    Final     Assessment & Plan:     Follow-up: No follow-ups on file.  Walker Kehr, MD

## 2019-10-22 NOTE — Assessment & Plan Note (Signed)
Toprol, Norvasc Labs

## 2019-10-22 NOTE — Assessment & Plan Note (Signed)
?  lymphedema Pt declined vasc clinic ref Elevate arm

## 2019-10-22 NOTE — Assessment & Plan Note (Signed)
Given info re: PCS

## 2019-10-23 ENCOUNTER — Other Ambulatory Visit: Payer: Self-pay

## 2019-10-23 ENCOUNTER — Ambulatory Visit (INDEPENDENT_AMBULATORY_CARE_PROVIDER_SITE_OTHER): Payer: Medicare Other | Admitting: Pharmacist

## 2019-10-23 DIAGNOSIS — E1122 Type 2 diabetes mellitus with diabetic chronic kidney disease: Secondary | ICD-10-CM | POA: Diagnosis not present

## 2019-10-23 DIAGNOSIS — Z5181 Encounter for therapeutic drug level monitoring: Secondary | ICD-10-CM

## 2019-10-23 DIAGNOSIS — M1030 Gout due to renal impairment, unspecified site: Secondary | ICD-10-CM | POA: Diagnosis not present

## 2019-10-23 DIAGNOSIS — I2510 Atherosclerotic heart disease of native coronary artery without angina pectoris: Secondary | ICD-10-CM | POA: Diagnosis not present

## 2019-10-23 DIAGNOSIS — I480 Paroxysmal atrial fibrillation: Secondary | ICD-10-CM

## 2019-10-23 DIAGNOSIS — I5033 Acute on chronic diastolic (congestive) heart failure: Secondary | ICD-10-CM | POA: Diagnosis not present

## 2019-10-23 DIAGNOSIS — N186 End stage renal disease: Secondary | ICD-10-CM | POA: Diagnosis not present

## 2019-10-23 DIAGNOSIS — I152 Hypertension secondary to endocrine disorders: Secondary | ICD-10-CM | POA: Diagnosis not present

## 2019-10-23 LAB — POCT INR: INR: 1.8 — AB (ref 2.0–3.0)

## 2019-10-23 NOTE — Patient Outreach (Signed)
[name]) Care Management  10/23/2019   Bonni Moncur [birth year] [id number] 30 day assessment  Subjective: Patient reports she is doing pretty well. Reports home health nurse and PT came to see her today. Reports she saw primary MD for a face to face visit yesterday.  Patient reports she weighs daily and takes her medications as prescribed, States she had really bad leg cramps last night.   Objective: awake and alert. Talking in complete sentences on phone. Today's Vitals   10/23/19 1217 10/23/19 1218  Weight: 169 lb (76.7 kg)   Height: 1.524 m (5')   PainSc:  6     Current Medications:  Current Outpatient Medications  Medication Sig Dispense Refill  . acetaminophen (TYLENOL) 500 MG tablet Take 500 mg by mouth every 6 (six) hours as needed for mild pain or headache.    Marland Kitchen amLODipine (NORVASC) 5 MG tablet Take 1 tablet (5 mg total) by mouth daily. 90 tablet 1  . dapsone 25 MG tablet Take 25 mg by mouth daily.     . Insulin Glargine, 1 Unit Dial, (TOUJEO SOLOSTAR) 300 UNIT/ML SOPN Inject 24 Units into the skin every morning. Titrate up by 1 unit a day if needed for goal sugars of 100-130 up to 30 units a day max 3 pen 11  . Insulin Pen Needle (B-D UF III MINI PEN NEEDLES) 31G X 5 MM MISC USE TO ADMINISTER INSULIN FOUR TIMES A DAY DX E11.9 100 each 1  . levothyroxine (SYNTHROID) 25 MCG tablet TAKE 2 TABLETS (50 MCG TOTAL) BY MOUTH DAILY. (Patient taking differently: Take 50 mcg by mouth daily before breakfast. ) 180 tablet 3  . metoprolol tartrate (LOPRESSOR) 25 MG tablet Take 0.5 tablets (12.5 mg total) by mouth 2 (two) times daily. 90 tablet 3  . ONETOUCH DELICA LANCETS 37T MISC Use to check blood sugars three times a day DX E11.9 100 each 5  . ONETOUCH VERIO test strip USE TO TEST 3 TIMES DAILY. DX E11.9 (Patient taking differently: 1 each by Other route 3 (three) times daily. DX: E11.9) 100 each 5  . OXYGEN Inhale 3 L into the lungs as needed (for shortness of breath).      . potassium chloride SA (KLOR-CON M20) 20 MEQ tablet Take 1 tablet (20 mEq total) by mouth daily. (Patient taking differently: Take 40 mEq by mouth daily. ) 30 tablet 3  . predniSONE (DELTASONE) 5 MG tablet Take 5 mg by mouth daily.   5  . Propylene Glycol (SYSTANE BALANCE) 0.6 % SOLN Place 1-2 drops into both eyes 3 (three) times daily as needed (for dryness).    . repaglinide (PRANDIN) 0.5 MG tablet Take 0.5 mg by mouth 3 (three) times daily before meals.    . rosuvastatin (CRESTOR) 20 MG tablet Take 1 tablet (20 mg total) by mouth daily. (Patient taking differently: Take 20 mg by mouth every other day. ) 90 tablet 3  . tacrolimus (PROGRAF) 1 MG capsule Take 3 mg by mouth 2 (two) times daily.     Marland Kitchen torsemide (DEMADEX) 20 MG tablet Take 3 tablets (60 mg total) by mouth daily. 90 tablet 0  . warfarin (COUMADIN) 5 MG tablet Take 0.5-1 tablets (2.5-5 mg total) by mouth daily at 6 PM. 1/2 tablet on Mondays, 1 tablet all other days (Patient taking differently: Take 2.5-5 mg by mouth daily at 6 PM. 2.5 mg daily)    . gabapentin (NEURONTIN) 300 MG capsule Take 1 capsule (300 mg total) by  mouth 3 (three) times daily. (Patient not taking: Reported on 10/23/2019) 90 capsule 5  . HYDROcodone-acetaminophen (NORCO/VICODIN) 5-325 MG tablet Take 1 tablet by mouth every 6 (six) hours as needed for severe pain. (Patient not taking: Reported on 10/23/2019) 60 tablet 0   No current facility-administered medications for this visit.    Functional Status:  In your present state of health, do you have any difficulty performing the following activities: 10/23/2019 10/02/2019  Hearing? Y [place]? Y N  Comment - -  Difficulty concentrating or making decisions? Y N  Comment states often. -  Walking or climbing stairs? Y Y  Comment has shortness of breath with activity -  Dressing or bathing? [name]  Comment takes her time getting it completed. -  Doing errands, shopping? Y Y  Comment unabe to drive Scientist, product/process development and eating ? Y -  Comment family helps with meals -  Using the Toilet? N -  In the past six months, have you accidently leaked urine? N -  Comment - -  Do you have problems with loss of bowel control? N -  Managing your Medications? N -  Managing your Finances? N -  Housekeeping or managing your Housekeeping? Y -  Comment husband helps -  Some recent data might be hidden    Fall/Depression Screening: Fall Risk  10/23/2019 08/03/2019 06/22/2019  Falls in the past year? 1 1 0  Comment - - -  Number falls in past yr: 0 0 -  Comment - Fall 07/13/2019 -  Injury with Fall? 1 1 -  Comment - large hematoma to left arm -  Risk for fall due to : History of fall(s) History of fall(s);Medication side effect;Impaired balance/gait;Impaired mobility Medication side effect;Impaired mobility;Impaired balance/gait  Follow up - Education provided;Falls prevention discussed;Falls evaluation completed Falls evaluation completed;Education provided;Falls prevention discussed   PHQ 2/9 Scores 10/23/2019 06/22/2019 12/28/2018 09/21/2018 09/12/2018 10/06/2017 05/05/2017  PHQ - 2 Score 0 1 0 0 0 0 0    Assessment:  (1) confirmed [facility] packet received in mail. Encouraged patient to return consent form. (2) CHF: weighs daily, aware of zones, follows low salt diet. Takes medications as prescribed. (3) DM:  Reports todays CBG of 227 fasting. Takes her insulin as prescribed.  (4) active with home health nursing and PT  Plan:  (1) will continue to follow patient weekl as she is fragile (2) reviewed zones afain and reciewed when patient should call MD.   (3) reviewed insulin orders and encouraged patient to titrate insulin per MD suggestion. Patient voiced understanding. (4) encouraged patient to be as active as possible.  Will follow up in 1 week. Will send this note to MD.   [facility] CM Care Plan Problem One     Most Recent Value  Care Plan Problem One  recent admission for heart failure.   Role  Documenting the Problem One  Care Management [place] for Problem One  Active  [facility] Long Term Goal   Patient will report no readmissions to the hospital in the next 31 days.   [facility] Long Term Goal Start Date  10/15/19  Interventions for Problem One Long Term Goal  Assessments completed. reviewed when to call MD. Encouraged low salt diet.   [facility] CM Short Term Goal #1   Patient will weigh daily and record weight on calendar for the next 30 days.   [facility] CM Short Term Goal #1 Start Date  10/15/19  Interventions for Short Term Goal #1  confirmed understanding of need for daily weights.   [facility] CM Short Term Goal #2   Patient will verbalize home health services starting in the next 7 days.   [facility] CM Short Term Goal #2 Start Date  10/15/19  [facility] CM Short Term Goal #2 Met Date  10/23/19      Tomasa Rand, RN, BSN, CEN [place] Coordinator [phone]

## 2019-10-24 ENCOUNTER — Telehealth: Payer: Self-pay

## 2019-10-24 NOTE — Telephone Encounter (Signed)
Left detailed message with verbal orders for below.

## 2019-10-24 NOTE — Telephone Encounter (Signed)
New message    PT calling needs verbal orders   Twice a week for four weeks   Once a week for three weeks   Also, need OT evaluation

## 2019-10-25 DIAGNOSIS — I5033 Acute on chronic diastolic (congestive) heart failure: Secondary | ICD-10-CM

## 2019-10-25 DIAGNOSIS — N390 Urinary tract infection, site not specified: Secondary | ICD-10-CM

## 2019-10-25 DIAGNOSIS — I4819 Other persistent atrial fibrillation: Secondary | ICD-10-CM

## 2019-10-25 DIAGNOSIS — Z940 Kidney transplant status: Secondary | ICD-10-CM

## 2019-10-25 DIAGNOSIS — E1122 Type 2 diabetes mellitus with diabetic chronic kidney disease: Secondary | ICD-10-CM | POA: Diagnosis not present

## 2019-10-25 DIAGNOSIS — J9611 Chronic respiratory failure with hypoxia: Secondary | ICD-10-CM

## 2019-10-25 DIAGNOSIS — K5730 Diverticulosis of large intestine without perforation or abscess without bleeding: Secondary | ICD-10-CM

## 2019-10-25 DIAGNOSIS — Z7901 Long term (current) use of anticoagulants: Secondary | ICD-10-CM

## 2019-10-25 DIAGNOSIS — J449 Chronic obstructive pulmonary disease, unspecified: Secondary | ICD-10-CM

## 2019-10-25 DIAGNOSIS — G4730 Sleep apnea, unspecified: Secondary | ICD-10-CM

## 2019-10-25 DIAGNOSIS — H9190 Unspecified hearing loss, unspecified ear: Secondary | ICD-10-CM

## 2019-10-25 DIAGNOSIS — D509 Iron deficiency anemia, unspecified: Secondary | ICD-10-CM

## 2019-10-25 DIAGNOSIS — I69320 Aphasia following cerebral infarction: Secondary | ICD-10-CM

## 2019-10-25 DIAGNOSIS — N186 End stage renal disease: Secondary | ICD-10-CM | POA: Diagnosis not present

## 2019-10-25 DIAGNOSIS — Z794 Long term (current) use of insulin: Secondary | ICD-10-CM

## 2019-10-25 DIAGNOSIS — I2510 Atherosclerotic heart disease of native coronary artery without angina pectoris: Secondary | ICD-10-CM | POA: Diagnosis not present

## 2019-10-25 DIAGNOSIS — E785 Hyperlipidemia, unspecified: Secondary | ICD-10-CM

## 2019-10-25 DIAGNOSIS — K8020 Calculus of gallbladder without cholecystitis without obstruction: Secondary | ICD-10-CM

## 2019-10-25 DIAGNOSIS — C3411 Malignant neoplasm of upper lobe, right bronchus or lung: Secondary | ICD-10-CM

## 2019-10-25 DIAGNOSIS — K2100 Gastro-esophageal reflux disease with esophagitis, without bleeding: Secondary | ICD-10-CM

## 2019-10-25 DIAGNOSIS — Z992 Dependence on renal dialysis: Secondary | ICD-10-CM

## 2019-10-25 DIAGNOSIS — Z955 Presence of coronary angioplasty implant and graft: Secondary | ICD-10-CM

## 2019-10-25 DIAGNOSIS — I152 Hypertension secondary to endocrine disorders: Secondary | ICD-10-CM | POA: Diagnosis not present

## 2019-10-25 DIAGNOSIS — Z87891 Personal history of nicotine dependence: Secondary | ICD-10-CM

## 2019-10-25 DIAGNOSIS — Z952 Presence of prosthetic heart valve: Secondary | ICD-10-CM

## 2019-10-25 DIAGNOSIS — M1030 Gout due to renal impairment, unspecified site: Secondary | ICD-10-CM

## 2019-10-29 ENCOUNTER — Telehealth: Payer: Self-pay

## 2019-10-29 NOTE — Telephone Encounter (Signed)
lmomed the pt to call us back w/hh information so that we may schedule the home tests for coumadin

## 2019-10-30 ENCOUNTER — Other Ambulatory Visit: Payer: Self-pay

## 2019-10-30 DIAGNOSIS — E1122 Type 2 diabetes mellitus with diabetic chronic kidney disease: Secondary | ICD-10-CM | POA: Diagnosis not present

## 2019-10-30 DIAGNOSIS — N186 End stage renal disease: Secondary | ICD-10-CM | POA: Diagnosis not present

## 2019-10-30 DIAGNOSIS — I2510 Atherosclerotic heart disease of native coronary artery without angina pectoris: Secondary | ICD-10-CM | POA: Diagnosis not present

## 2019-10-30 DIAGNOSIS — I152 Hypertension secondary to endocrine disorders: Secondary | ICD-10-CM | POA: Diagnosis not present

## 2019-10-30 DIAGNOSIS — I5033 Acute on chronic diastolic (congestive) heart failure: Secondary | ICD-10-CM | POA: Diagnosis not present

## 2019-10-30 DIAGNOSIS — M1030 Gout due to renal impairment, unspecified site: Secondary | ICD-10-CM | POA: Diagnosis not present

## 2019-10-30 NOTE — Patient Outreach (Signed)
[facility]) Care Management  10/30/2019  [name] 1945/10/01 [id number]    Telephone Assessment: Vitals:   10/30/19 1335  Weight: 169 lb (76.7 kg)       Placed call to patient who reports that she is just getting up. Reports she had a rough night with neuropathy in her feet.  Reports no changes in medications after going to the coumadin clinic last week. Reports PT is coming today.  States she has some swelling in her legs but no weight gain. I reviewed with patient when to call MD. Reviewed low salt diet and importance of daily weights.  PLAN: Will follow up in 1 week. Tomasa Rand, RN, BSN, CEN [name] [employer] [phone]

## 2019-10-31 ENCOUNTER — Telehealth: Payer: Self-pay

## 2019-10-31 DIAGNOSIS — M1030 Gout due to renal impairment, unspecified site: Secondary | ICD-10-CM | POA: Diagnosis not present

## 2019-10-31 DIAGNOSIS — E1122 Type 2 diabetes mellitus with diabetic chronic kidney disease: Secondary | ICD-10-CM | POA: Diagnosis not present

## 2019-10-31 DIAGNOSIS — I5033 Acute on chronic diastolic (congestive) heart failure: Secondary | ICD-10-CM | POA: Diagnosis not present

## 2019-10-31 DIAGNOSIS — I2510 Atherosclerotic heart disease of native coronary artery without angina pectoris: Secondary | ICD-10-CM | POA: Diagnosis not present

## 2019-10-31 DIAGNOSIS — N186 End stage renal disease: Secondary | ICD-10-CM | POA: Diagnosis not present

## 2019-10-31 DIAGNOSIS — I152 Hypertension secondary to endocrine disorders: Secondary | ICD-10-CM | POA: Diagnosis not present

## 2019-10-31 NOTE — Telephone Encounter (Signed)
Called and spoke w/amedysis hh and gave verbal orders for pt inr to be drawn

## 2019-11-01 ENCOUNTER — Ambulatory Visit (INDEPENDENT_AMBULATORY_CARE_PROVIDER_SITE_OTHER): Payer: Medicare Other | Admitting: Cardiology

## 2019-11-01 DIAGNOSIS — I480 Paroxysmal atrial fibrillation: Secondary | ICD-10-CM | POA: Diagnosis not present

## 2019-11-01 DIAGNOSIS — I5033 Acute on chronic diastolic (congestive) heart failure: Secondary | ICD-10-CM | POA: Diagnosis not present

## 2019-11-01 DIAGNOSIS — Z5181 Encounter for therapeutic drug level monitoring: Secondary | ICD-10-CM

## 2019-11-01 DIAGNOSIS — E1122 Type 2 diabetes mellitus with diabetic chronic kidney disease: Secondary | ICD-10-CM | POA: Diagnosis not present

## 2019-11-01 DIAGNOSIS — I152 Hypertension secondary to endocrine disorders: Secondary | ICD-10-CM | POA: Diagnosis not present

## 2019-11-01 DIAGNOSIS — N186 End stage renal disease: Secondary | ICD-10-CM | POA: Diagnosis not present

## 2019-11-01 DIAGNOSIS — M1030 Gout due to renal impairment, unspecified site: Secondary | ICD-10-CM | POA: Diagnosis not present

## 2019-11-01 DIAGNOSIS — I2510 Atherosclerotic heart disease of native coronary artery without angina pectoris: Secondary | ICD-10-CM | POA: Diagnosis not present

## 2019-11-01 LAB — POCT INR: INR: 3.1 — AB (ref 2.0–3.0)

## 2019-11-06 ENCOUNTER — Other Ambulatory Visit: Payer: Self-pay

## 2019-11-06 ENCOUNTER — Telehealth: Payer: Self-pay

## 2019-11-06 NOTE — Telephone Encounter (Signed)
New message   The patient calling unable to come in the office hardly can walk  C/o can not move neck feels like a skink in neck   Have two home health nurses coming into the home and are not given assistance like she wants

## 2019-11-06 NOTE — Patient Outreach (Signed)
[facility]) Care Management  11/06/2019  [name] 04-29-1946 [id number]   Telephone assessment:  Placed call to patient who answered and reports she is doing ok. States she has had a "stiff neck for 2 days' denies fever. Reports she has been using voltaren gel which has helped. Encouraged patient to try some tylenol and call MD for no improvements.   Reports her legs were weak and she did not sleep well. Reviewed weight and patient reports weight is the same and she thinks something is wrong with her scale. Reports that it weighs the same every day.  Denies shortness of breath.   PLAN: requested a new scale be sent to patient.  Reviewed with patient when to call MD. Reviewed over the counter medications like tylenol to help with neck pain.  Will plan to call back in 1 week. Encouraged patient to call me soon if needed.   Tomasa Rand, RN, BSN, CEN [facility] [employer] [phone]

## 2019-11-08 ENCOUNTER — Ambulatory Visit (INDEPENDENT_AMBULATORY_CARE_PROVIDER_SITE_OTHER): Payer: Medicare Other | Admitting: Pharmacist Clinician (PhC)/ Clinical Pharmacy Specialist

## 2019-11-08 DIAGNOSIS — E1122 Type 2 diabetes mellitus with diabetic chronic kidney disease: Secondary | ICD-10-CM | POA: Diagnosis not present

## 2019-11-08 DIAGNOSIS — I5033 Acute on chronic diastolic (congestive) heart failure: Secondary | ICD-10-CM | POA: Diagnosis not present

## 2019-11-08 DIAGNOSIS — I2510 Atherosclerotic heart disease of native coronary artery without angina pectoris: Secondary | ICD-10-CM | POA: Diagnosis not present

## 2019-11-08 DIAGNOSIS — Z5181 Encounter for therapeutic drug level monitoring: Secondary | ICD-10-CM

## 2019-11-08 DIAGNOSIS — M1030 Gout due to renal impairment, unspecified site: Secondary | ICD-10-CM | POA: Diagnosis not present

## 2019-11-08 DIAGNOSIS — N186 End stage renal disease: Secondary | ICD-10-CM | POA: Diagnosis not present

## 2019-11-08 DIAGNOSIS — I480 Paroxysmal atrial fibrillation: Secondary | ICD-10-CM

## 2019-11-08 DIAGNOSIS — I152 Hypertension secondary to endocrine disorders: Secondary | ICD-10-CM | POA: Diagnosis not present

## 2019-11-08 LAB — POCT INR: INR: 4.4 — AB (ref 2.0–3.0)

## 2019-11-08 NOTE — Telephone Encounter (Signed)
Please advise 

## 2019-11-09 DIAGNOSIS — N186 End stage renal disease: Secondary | ICD-10-CM | POA: Diagnosis not present

## 2019-11-09 DIAGNOSIS — M1030 Gout due to renal impairment, unspecified site: Secondary | ICD-10-CM | POA: Diagnosis not present

## 2019-11-09 DIAGNOSIS — I2510 Atherosclerotic heart disease of native coronary artery without angina pectoris: Secondary | ICD-10-CM | POA: Diagnosis not present

## 2019-11-09 DIAGNOSIS — I5033 Acute on chronic diastolic (congestive) heart failure: Secondary | ICD-10-CM | POA: Diagnosis not present

## 2019-11-09 DIAGNOSIS — E1122 Type 2 diabetes mellitus with diabetic chronic kidney disease: Secondary | ICD-10-CM | POA: Diagnosis not present

## 2019-11-09 DIAGNOSIS — I152 Hypertension secondary to endocrine disorders: Secondary | ICD-10-CM | POA: Diagnosis not present

## 2019-11-09 NOTE — Telephone Encounter (Signed)
What is the question? Thx

## 2019-11-09 NOTE — Telephone Encounter (Signed)
Pt states the pain is better and does not need anything as of right now

## 2019-11-13 ENCOUNTER — Other Ambulatory Visit: Payer: Self-pay

## 2019-11-13 DIAGNOSIS — I5033 Acute on chronic diastolic (congestive) heart failure: Secondary | ICD-10-CM | POA: Diagnosis not present

## 2019-11-13 DIAGNOSIS — N186 End stage renal disease: Secondary | ICD-10-CM | POA: Diagnosis not present

## 2019-11-13 DIAGNOSIS — M1030 Gout due to renal impairment, unspecified site: Secondary | ICD-10-CM | POA: Diagnosis not present

## 2019-11-13 DIAGNOSIS — I2510 Atherosclerotic heart disease of native coronary artery without angina pectoris: Secondary | ICD-10-CM | POA: Diagnosis not present

## 2019-11-13 DIAGNOSIS — I152 Hypertension secondary to endocrine disorders: Secondary | ICD-10-CM | POA: Diagnosis not present

## 2019-11-13 DIAGNOSIS — E1122 Type 2 diabetes mellitus with diabetic chronic kidney disease: Secondary | ICD-10-CM | POA: Diagnosis not present

## 2019-11-13 NOTE — Patient Outreach (Signed)
[facility]) Care Management  11/13/2019  Harjit Douds 1946-05-18 [id number]   Telephone assessment:  Placed call to patient and explained reason for call. Reviewed with patient that she received her scale. Reports the new scale is working correctly and her current weight was 160 today. Reports CBG higher today because she ate cookies last night. But states he has been running in the 200's last week.  Reports pain in her neck is gone after using tylenol.  States no swelling and no shortness of breath. Continues to be concerned with pain in her feet.  Denies any new problems or concerns today. Reviewed plan of care and will plan to call patient back in 1 week and close case of no additional needs identified.  PLAN: call back in 1 week.  Tomasa Rand, RN, BSN, CEN [facility] [employer] [phone]

## 2019-11-13 NOTE — Progress Notes (Signed)
HPI: FU aortic stenosis s/p AVR and atrial fibrillation. Patient has a history of embolic CVA. She has a history of coronary artery disease, status post PCI of the right coronary artery. She underwent cardiac catheterization in September 2014 for severe AS. The right coronary was totally occluded but no other coronary disease noted.Patient had aortic valve replacement with pericardial tissue valve and also had Maze procedure. Carotid Dopplers September 2018 showed 1 to 39% bilateral stenosis. Abdominal ultrasound October 2018 showed no abdominal aortic aneurysm and no focal stenosis in the aorta or iliacs; abnormal ABIs; PVD followed by Dr Fletcher Anon.Nuclear studyMay 2019 showed ejection fraction 59% and normal perfusion. Patient has a known lung tumor but has declined resection. She did receive radiation therapy. Echocardiogram March 2021 showed normal LV function, mild left ventricular hypertrophy, severe pulmonary hypertension, biatrial enlargement, mild to moderate mitral regurgitation, mild to moderate tricuspid regurgitation, prior aortic valve replacement with mean gradient 13 mmHg. Patient admitted March 2021 with CHF. She was diuresed. Since she was last seenshe has some dyspnea on exertion unchanged.  Her pedal edema persists but is improved.  No chest pain, palpitations or syncope.  No bleeding.  Current Outpatient Medications  Medication Sig Dispense Refill  . acetaminophen (TYLENOL) 500 MG tablet Take 500 mg by mouth every 6 (six) hours as needed for mild pain or headache.    . dapsone 25 MG tablet Take 25 mg by mouth daily.     Marland Kitchen gabapentin (NEURONTIN) 300 MG capsule Take 1 capsule (300 mg total) by mouth 3 (three) times daily. 90 capsule 5  . HYDROcodone-acetaminophen (NORCO/VICODIN) 5-325 MG tablet Take 1 tablet by mouth every 6 (six) hours as needed for severe pain. 60 tablet 0  . Insulin Glargine, 1 Unit Dial, (TOUJEO SOLOSTAR) 300 UNIT/ML SOPN Inject 24 Units into the skin every  morning. Titrate up by 1 unit a day if needed for goal sugars of 100-130 up to 30 units a day max 3 pen 11  . Insulin Pen Needle (B-D UF III MINI PEN NEEDLES) 31G X 5 MM MISC USE TO ADMINISTER INSULIN FOUR TIMES A DAY DX E11.9 100 each 1  . levothyroxine (SYNTHROID) 25 MCG tablet TAKE 2 TABLETS (50 MCG TOTAL) BY MOUTH DAILY. (Patient taking differently: Take 50 mcg by mouth daily before breakfast. ) 180 tablet 3  . metoprolol tartrate (LOPRESSOR) 25 MG tablet Take 0.5 tablets (12.5 mg total) by mouth 2 (two) times daily. 90 tablet 3  . ONETOUCH DELICA LANCETS 06Y MISC Use to check blood sugars three times a day DX E11.9 100 each 5  . ONETOUCH VERIO test strip USE TO TEST 3 TIMES DAILY. DX E11.9 (Patient taking differently: 1 each by Other route 3 (three) times daily. DX: E11.9) 100 each 5  . OXYGEN Inhale 3 L into the lungs as needed (for shortness of breath).     . potassium chloride SA (KLOR-CON M20) 20 MEQ tablet Take 1 tablet (20 mEq total) by mouth daily. (Patient taking differently: Take 40 mEq by mouth daily. ) 30 tablet 3  . predniSONE (DELTASONE) 5 MG tablet Take 5 mg by mouth daily.   5  . Propylene Glycol (SYSTANE BALANCE) 0.6 % SOLN Place 1-2 drops into both eyes 3 (three) times daily as needed (for dryness).    . repaglinide (PRANDIN) 0.5 MG tablet Take 0.5 mg by mouth 3 (three) times daily before meals.    . rosuvastatin (CRESTOR) 20 MG tablet Take 1 tablet (20  mg total) by mouth daily. (Patient taking differently: Take 20 mg by mouth every other day. ) 90 tablet 3  . tacrolimus (PROGRAF) 1 MG capsule Take 3 mg by mouth 2 (two) times daily.     Marland Kitchen warfarin (COUMADIN) 5 MG tablet Take 0.5-1 tablets (2.5-5 mg total) by mouth daily at 6 PM. 1/2 tablet on Mondays, 1 tablet all other days (Patient taking differently: Take 2.5-5 mg by mouth daily at 6 PM. 2.5 mg daily)    . torsemide (DEMADEX) 20 MG tablet Take 3 tablets (60 mg total) by mouth daily. 90 tablet 0   No current  facility-administered medications for this visit.     Past Medical History:  Diagnosis Date  . Acute CHF (congestive heart failure) ([place]) 12/2018  . Adenocarcinoma of lung, stage 1, right ([place]) 11/25/2017  . Anemia, iron deficiency    of chronic disease  . Aortic stenosis    a. Severe AS by echo 11/2012.  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 Asystole ([place])    a. During ENT surgery 2005: developed marked asystole requiring CPR, felt due to vagal reaction (cath nonobst dz).  . Carotid artery disease ([place])    a. Carotid Dopplers performed in August 2013 showed 40-59% left stenosis and 0-39% right; f/u recommended in 2 years.   . Cerebrovascular accident [facility]) 2009   a. LMCA infarct felt embolic 6578, maintained on chronic coumadin.; denies residual on 04/05/2013  . Cholelithiasis   . Chronic Persistent Atrial Fibrillation 12/31/2008   Qualifier: Diagnosis of  By: [name]    . Coronary artery disease 05/2002   a. Ant MI 2003 s/p PTCA/stent to RCA.   . Diverticulosis of colon   . Esophagitis, reflux   . ESRD (end stage renal disease) ([place])    a. Mass on L kidney per pt s/p nephrectomy - pt states not cancer - [facility] notes indicate ESRD due to HTN/DM - was previously on HD. b. Kidney transplant 02/2011.  Marland Kitchen GERD (gastroesophageal reflux disease)   . Gout   . Hearing loss   . Helicobacter pylori (H. pylori) infection    hx of  . Hemorrhoids   . Hx of colonic polyps    adenomatous  . Hyperlipidemia   . Hypertension   . Lung nodule seen on imaging study 04/07/2013   1.0 cm ground glass opacity RUL  . Myocardial infarction ([place]) 2003  . Pericardial effusion    a. Small by echo 11/2011.  . S/P aortic valve replacement with bioprosthetic valve and maze procedure 04/12/2013   29mm [name] Ease bovine pericardial tissue valve   . S/P Maze operation for atrial fibrillation 04/12/2013   Complete bilateral atrial lesion set using bipolar radiofrequency and  cryothermy ablation with clipping of LA appendage  . Sleep apnea    Pt says testing was positive, intolerant of CPAP.  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  . Stroke ([place])   . Type II diabetes mellitus ([place])     Past Surgical History:  Procedure Laterality Date  . AORTIC VALVE REPLACEMENT N/A 04/12/2013   Procedure: AORTIC VALVE REPLACEMENT (AVR);  Surgeon: Rexene Alberts, MD;  Location: [place];  Service: Open Heart Surgery;  Laterality: N/A;  . ARTERIOVENOUS GRAFT PLACEMENT Left   . ARTERIOVENOUS GRAFT PLACEMENT Left    "I've  had 2 on my left; had one removed" (04/05/2013)   . ARTERY EXPLORATION Right 04/11/2013   Procedure: ARTERY EXPLORATION;  Surgeon: Rexene Alberts, MD;  Location: [place];  Service: Open Heart Surgery;  Laterality: Right;  Right carotid artery exploration  . AV FISTULA PLACEMENT Right   . AV FISTULA REPAIR Right    "took it out" ((/18/2014)  . CARDIOVERSION  05/29/2012   Procedure: CARDIOVERSION;  Surgeon: Lelon Perla, MD;  Location[name] ENDOSCOPY;  Service: Cardiovascular;  Laterality: N/A;  . CHOLECYSTECTOMY  2009   with hernia removal  . CORONARY ANGIOPLASTY WITH STENT PLACEMENT Right    coronary artery  . INSERTION OF DIALYSIS CATHETER Bilateral    "over the years; took them both out" (04/05/2013)  . INTRAOPERATIVE TRANSESOPHAGEAL ECHOCARDIOGRAM N/A 04/11/2013   Procedure: INTRAOPERATIVE TRANSESOPHAGEAL ECHOCARDIOGRAM;  Surgeon: Rexene Alberts, MD;  Location: [place];  Service: Open Heart Surgery;  Laterality: N/A;  . INTRAOPERATIVE TRANSESOPHAGEAL ECHOCARDIOGRAM N/A 04/12/2013   Procedure: INTRAOPERATIVE TRANSESOPHAGEAL ECHOCARDIOGRAM;  Surgeon: Rexene Alberts, MD;  Location: [place];  Service: Open Heart Surgery;  Laterality: N/A;  . KIDNEY TRANSPLANT  03/16/11  . LEFT AND RIGHT HEART  CATHETERIZATION WITH CORONARY ANGIOGRAM N/A 04/06/2013   Procedure: LEFT AND RIGHT HEART CATHETERIZATION WITH CORONARY ANGIOGRAM;  Surgeon: Blane Ohara, MD;  [facility] C[facility] LAB;  Service: Cardiovascular;  Laterality: N/A;  . MAZE N/A 04/12/2013   Procedure: MAZE;  Surgeon: Rexene Alberts, MD;  Location: [place];  Service: Open Heart Surgery;  Laterality: N/A;  . NASAL RECONSTRUCTION WITH SEPTAL REPAIR     "took it out" (04/05/2013)  . NEPHRECTOMY Left 2010   no CA on bx  . TONSILLECTOMY    . TOTAL ABDOMINAL HYSTERECTOMY    . TUBAL LIGATION      Social History   Socioeconomic History  . Marital status: Married    Spouse name: Not on file  . Number of children: 5  . Years of education: 49  . Highest education level: Not on file  Occupational History  . Occupation: retired    Fish farm manager: RETIRED  Tobacco Use  . Smoking status: Former Smoker    Packs/day: 1.00    Years: 30.00    Pack years: 30.00    Types: Cigarettes    Quit date: 07/19/2001    Years since quitting: 18.3  . Smokeless tobacco: Never Used  Substance and Sexual Activity  . Alcohol use: No  . Drug use: No  . Sexual activity: Not Currently  Other Topics Concern  . Not on file  Social History Narrative   Patient signed a Designated Party Release to allow her spouse [name] and family and five children to have access to her medical records/information.     Patient lives with husband in a 2 story home.  Has 5 children. Retired from Personal assistant.  Education: 3 years of college.    Social Determinants of Health   Financial Resource Strain:   . Difficulty of Paying Living Expenses:   Food Insecurity: No Food Insecurity  . Worried About Charity fundraiser in the Last Year: Never true  . Ran Out of Food in the Last Year: Never true  Transportation Needs: No Transportation Needs  . Lack of Transportation (Medical): No  . Lack of Transportation (Non-Medical): No  Physical Activity: Inactive  . Days of Exercise  per Week: 0 days  . Minutes of Exercise per Session: 0 min  Stress: Stress Concern  Present  . Feeling of Stress : Rather much  Social Connections:   . Frequency of Communication with Friends and Family:   . Frequency of Social Gatherings with Friends and Family:   . Attends Religious Services:   . Active Member of Clubs or Organizations:   . Attends Archivist Meetings:   Marland Kitchen Marital Status:   Intimate Partner Violence: Not At Risk  . Fear of Current or Ex-Partner: No  . Emotionally Abused: No  . Physically Abused: No  . Sexually Abused: No    Family History  Problem Relation Age of Onset  . Stroke Father   . Hypertension Mother   . Diabetes Other   . Diabetes Maternal Grandmother   . Diabetes Son   . Crohn's disease Other        grandson   . Breast cancer Neg Hx   . Stomach cancer Neg Hx   . Esophageal cancer Neg Hx   . Colon cancer Neg Hx   . Pancreatic cancer Neg Hx     ROS: no fevers or chills, productive cough, hemoptysis, dysphasia, odynophagia, melena, hematochezia, dysuria, hematuria, rash, seizure activity, orthopnea, PND,  claudication. Remaining systems are negative.  Physical Exam: Well-developed well-nourished in no acute distress.  Skin is warm and dry.  HEENT is normal.  Neck is supple.  Chest is clear to auscultation with normal expansion.  Cardiovascular exam is irregular.  2/6 systolic murmur.  No diastolic murmur. Abdominal exam nontender or distended. No masses palpated. Extremities show 2+ edema on the right and 1+ on the left. neuro grossly intact   A/P  1 chronic diastolic congestive heart failure-her volume status has improved since recent hospitalization. Continue diuretics at present dose. Check potassium and renal function.  2 chronic stage IV kidney disease/prior renal transplant-follow-up nephrology.  She has an appointment next week.  3 prior aortic valve replacement-normal function on most recent echocardiogram. Continue SBE  prophylaxis.  4 paroxysmal atrial fibrillation- Continue metoprolol and Coumadin.  5 coronary artery disease-continue statin. She is not on aspirin given need for Coumadin.  [name], MD

## 2019-11-15 ENCOUNTER — Ambulatory Visit (INDEPENDENT_AMBULATORY_CARE_PROVIDER_SITE_OTHER): Payer: Medicare Other | Admitting: Cardiology

## 2019-11-15 ENCOUNTER — Other Ambulatory Visit: Payer: Self-pay

## 2019-11-15 ENCOUNTER — Ambulatory Visit (INDEPENDENT_AMBULATORY_CARE_PROVIDER_SITE_OTHER): Payer: Medicare Other | Admitting: Pharmacist

## 2019-11-15 ENCOUNTER — Encounter: Payer: Self-pay | Admitting: Cardiology

## 2019-11-15 VITALS — BP 138/72 | HR 86 | Ht 60.0 in | Wt 165.0 lb

## 2019-11-15 DIAGNOSIS — I5032 Chronic diastolic (congestive) heart failure: Secondary | ICD-10-CM | POA: Diagnosis not present

## 2019-11-15 DIAGNOSIS — Z5181 Encounter for therapeutic drug level monitoring: Secondary | ICD-10-CM

## 2019-11-15 DIAGNOSIS — I480 Paroxysmal atrial fibrillation: Secondary | ICD-10-CM

## 2019-11-15 DIAGNOSIS — Z952 Presence of prosthetic heart valve: Secondary | ICD-10-CM

## 2019-11-15 DIAGNOSIS — I2510 Atherosclerotic heart disease of native coronary artery without angina pectoris: Secondary | ICD-10-CM | POA: Diagnosis not present

## 2019-11-15 LAB — POCT INR
INR: 1.1 — AB (ref 2.0–3.0)
INR: 1.1 — AB (ref 2.0–3.0)

## 2019-11-15 IMAGING — DX DG CHEST 1V PORT
1 series · 1 of 1 positions shown · non-contrast
Comparison: CT-guided right upper lobe pulmonary nodule
biopsy-earlier same day; PET-CT-11/03/2017; chest CT-10/11/2017;
chest radiograph-01/07/2017; 12/27/2016

CLINICAL DATA: Post right upper lobe pulmonary nodule biopsy

EXAM:
PORTABLE CHEST 1 VIEW

[chest ap]
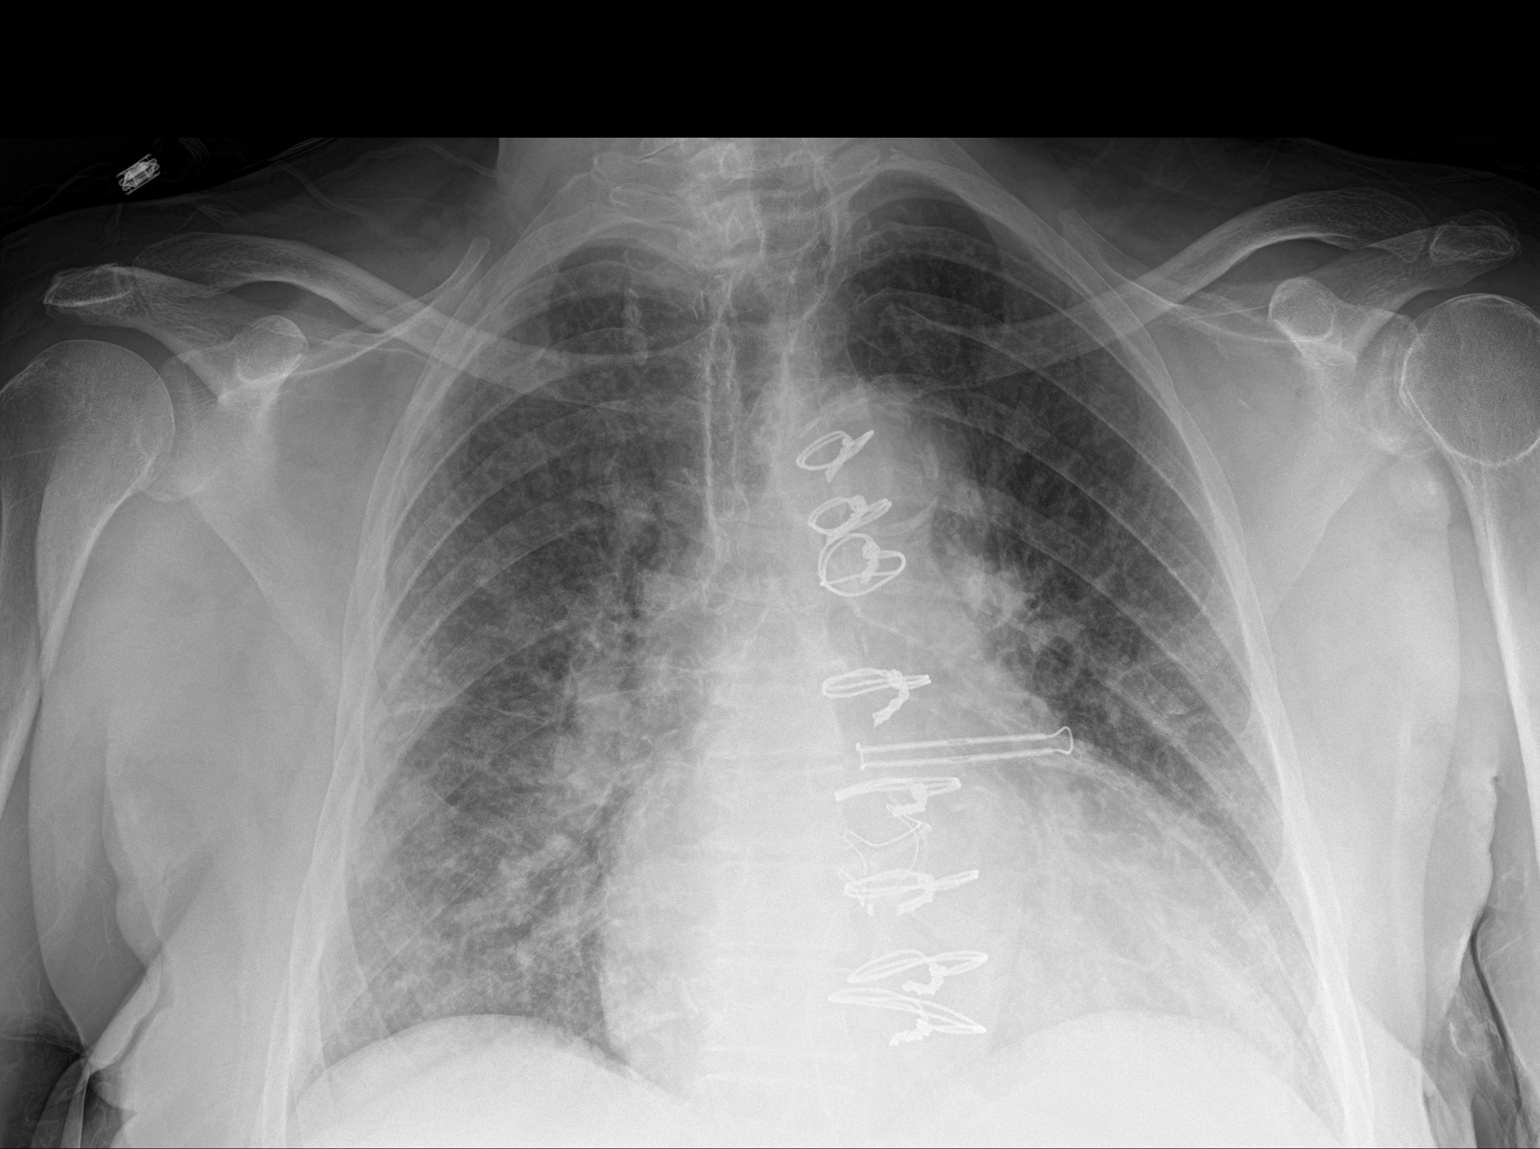

[1 of 1 positions shown; findings below may reference images not displayed]

FINDINGS: Grossly unchanged enlarged cardiac silhouette and mediastinal
contours post median sternotomy and valve replacement.
Atherosclerotic plaque within the thoracic aorta. Mild pulmonary
venous congestion without frank evidence of edema, grossly
unchanged. There is persistent pleuroparenchymal thickening about
the right minor fissure. Known ill-defined right upper lobe
pulmonary nodule is not well demonstrated on the present
examination. No new focal airspace opacities. No pleural effusion or
pneumothorax. No acute osseus abnormalities.
IMPRESSION: 1. No evidence of delayed complication following right upper lobe
pulmonary nodule biopsy. Specifically, no evidence of pneumothorax.
2. Similar findings of cardiomegaly and pulmonary venous congestion.

## 2019-11-15 IMAGING — CT CT BIOPSY
2 of 5 series · 13 of 32 positions shown, 18 images · non-contrast
Comparison: PET-CT - 11/03/2017;

INDICATION: Indeterminate right upper lobe pulmonary nodule. Please perform
CT-guided biopsy for tissue diagnostic purposes.

EXAM:
CT-GUIDED BIOPSY OF INDETERMINATE RIGHT UPPER LOBE PULMONARY NODULE

[Series 2: i-spiral 5.0 b40f · axial · 0.65mm/px · z∈[+1246,+1393]mm · 7 of 58 slices shown, 12 images (1 of 2)]
[im 8/58  soft-tissue]
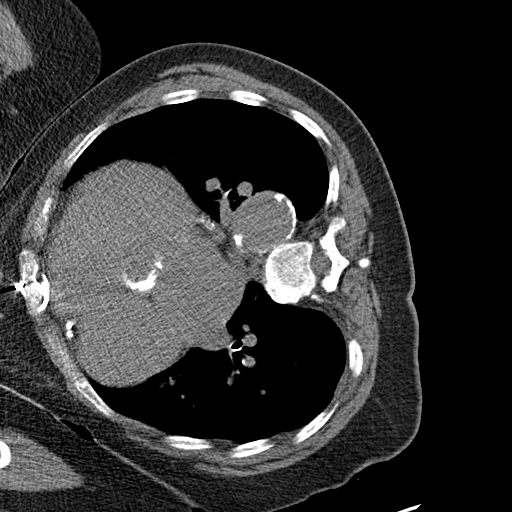
[im 8/58  bone]
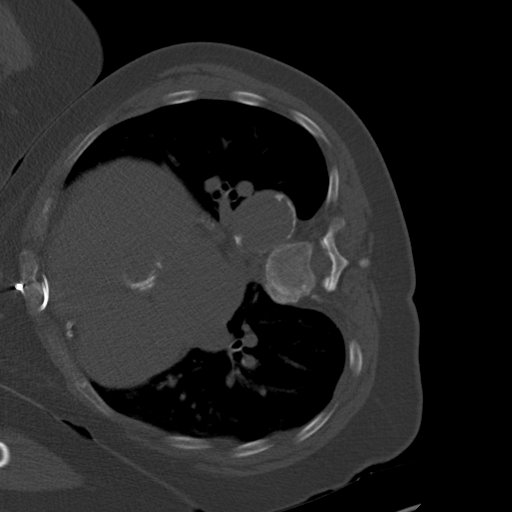
[im 15/58  soft-tissue]
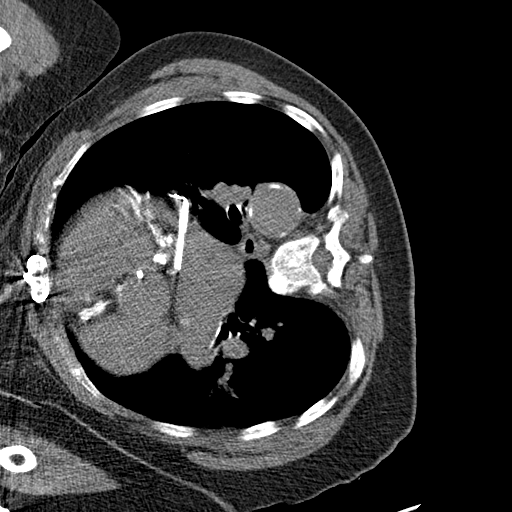
[im 22/58  soft-tissue]
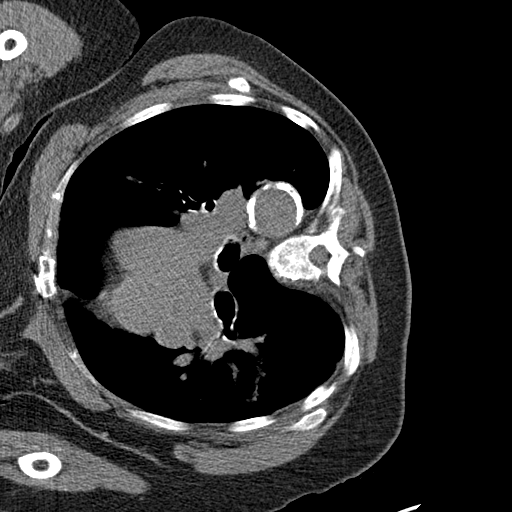
[im 29/58  soft-tissue]
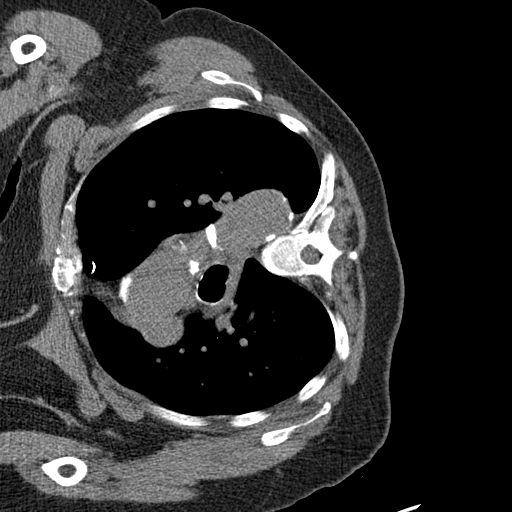
[im 29/58  lung]
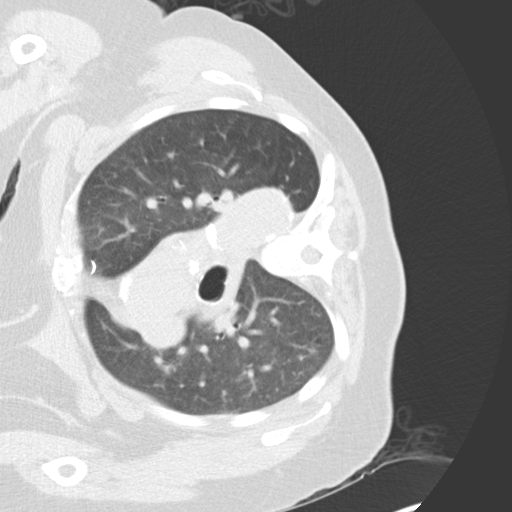
[im 36/58  soft-tissue]
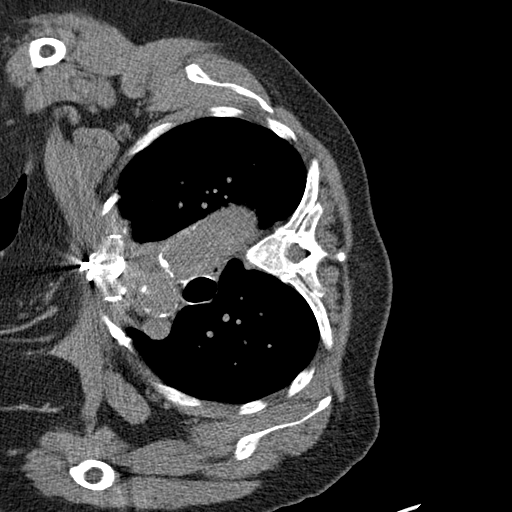
[im 36/58  lung]
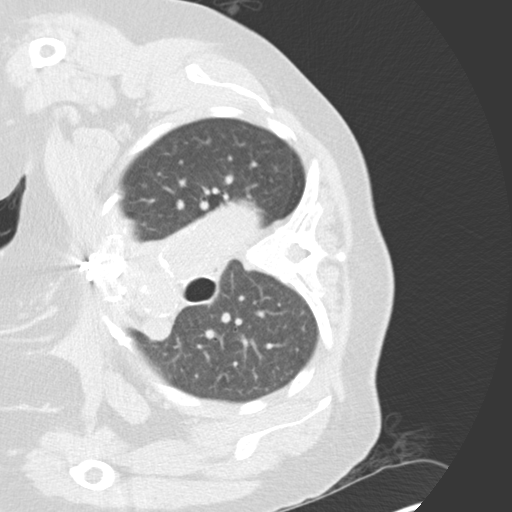
[im 43/58  soft-tissue]
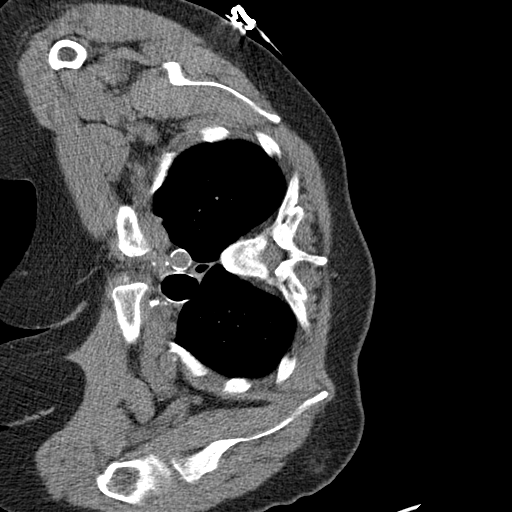
[im 43/58  lung]
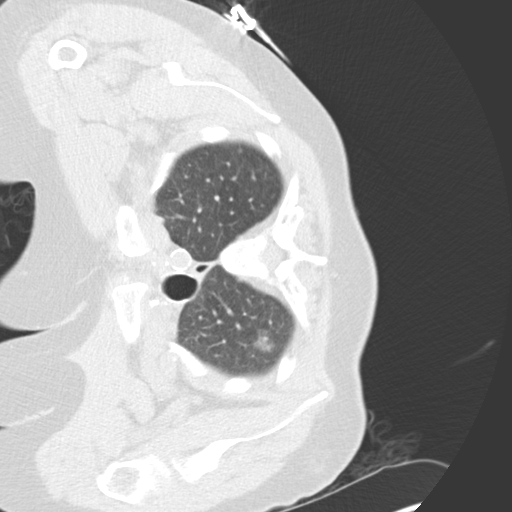
[im 50/58  soft-tissue]
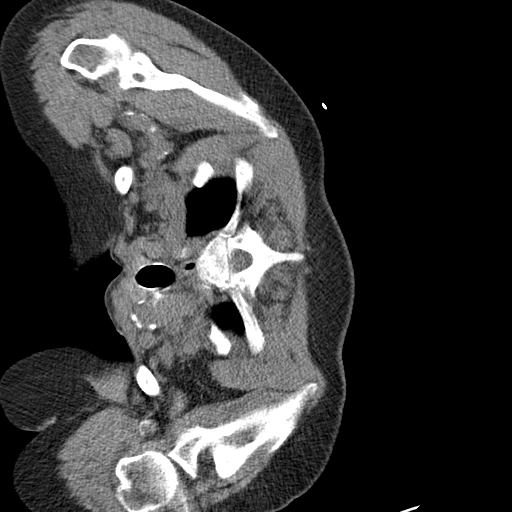
[im 50/58  lung]
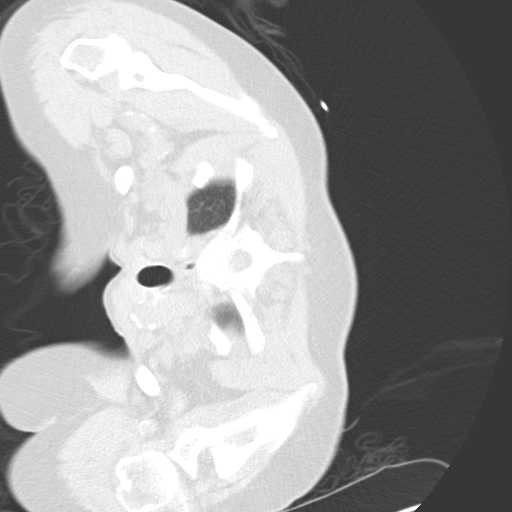

[Series 6: i-spiral 5.0 b40f · axial · 0.76mm/px · z∈[+1258,+1384]mm · 6 of 52 slices shown (2 of 2)]
[im 8/52  soft-tissue]
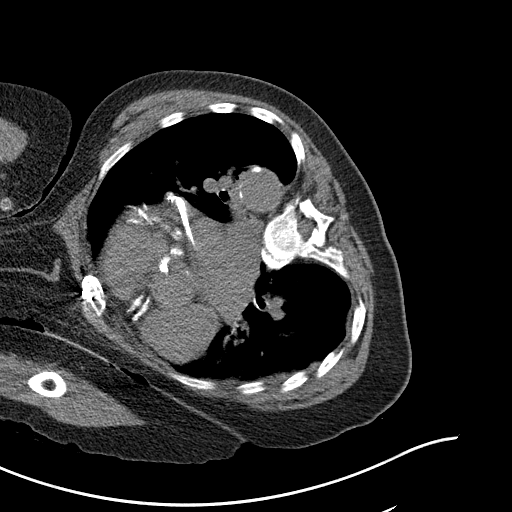
[im 15/52  soft-tissue]
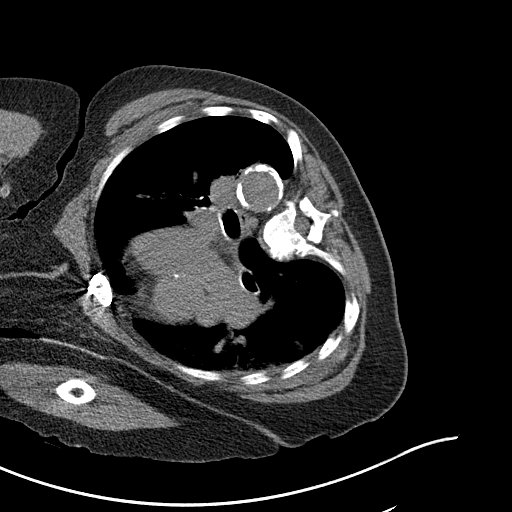
[im 22/52  soft-tissue]
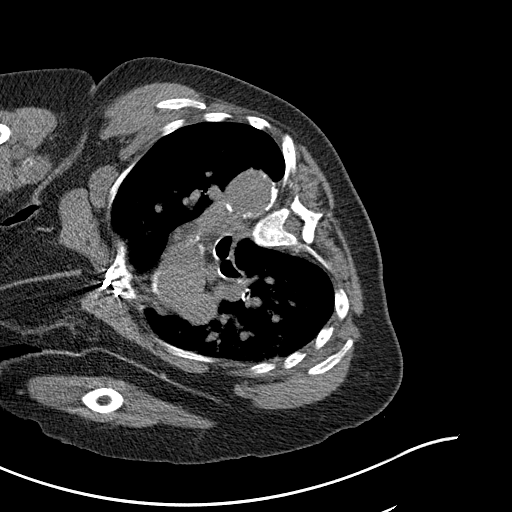
[im 30/52  soft-tissue]
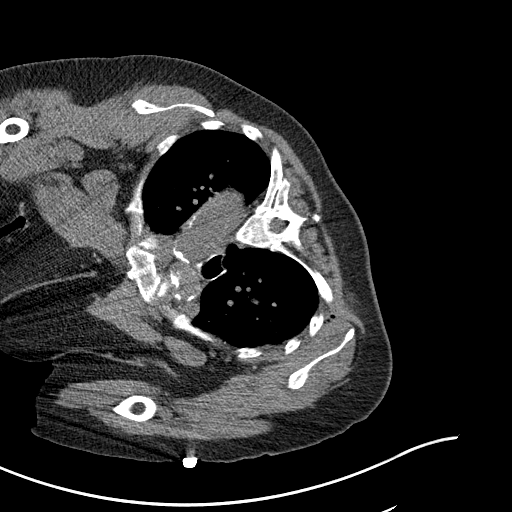
[im 37/52  soft-tissue]
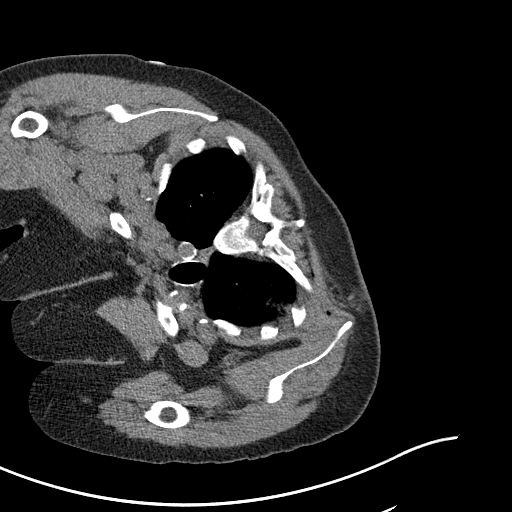
[im 44/52  soft-tissue]
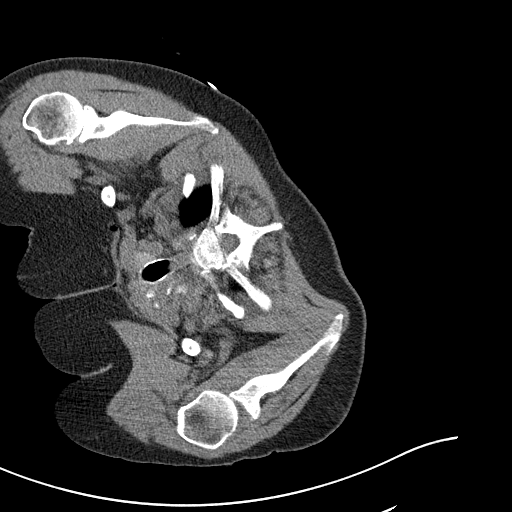

[13 of 32 positions shown; findings below may reference images not displayed]

chest CT - 10/11/2017

MEDICATIONS:
None.

ANESTHESIA/SEDATION:
Fentanyl 50 mcg IV; Versed 0.5 mg IV

Sedation time: 15 minutes; The patient was continuously monitored
during the procedure by the interventional radiology nurse under my
direct supervision.

CONTRAST:  None

COMPLICATIONS:
SIR Level A - No therapy, no consequence.

Procedure complicated by solitary episode of self-limited hemoptysis
not requiring dedicated intervention.

PROCEDURE:
Informed consent was obtained from the patient following an
explanation of the procedure, risks, benefits and alternatives. The
patient understands,agrees and consents for the procedure. All
questions were addressed. A time out was performed prior to the
initiation of the procedure.

The patient was positioned right lateral decubitus on the CT table
and a limited chest CT was performed for procedural planning
demonstrating unchanged size of the approximately 1.5 x 1.4 cm
ground-glass nodular opacity within the right lung apex (image 17,
series 2. The operative site was prepped and draped in the usual
sterile fashion. Under sterile conditions and local anesthesia, a 17
gauge coaxial needle was advanced into the peripheral aspect of the
nodule. Positioning was confirmed with intermittent CT fluoroscopy
and followed by the acquisition of solitary core needle biopsy with
an 18 gauge core needle biopsy device.

Following the acquisition of the core needle biopsy, patient
experienced a solitary episode of self-limited hemoptysis and as
such, additional samples were not obtained. The coaxial needle was
removed following deployment of a [employer] plug and superficial
hemostasis was achieved with manual compression.

Patient was maintained right lateral decubitus until postprocedural
radiograph was obtained.

Limited post procedural chest CT was negative for pneumothorax or
additional complication. A dressing was placed. The patient
tolerated the procedure well without immediate postprocedural
complication. The patient was escorted to have an upright chest
radiograph.
IMPRESSION: Technically successful CT guided core needle core biopsy of
indeterminate right upper lobe pulmonary nodule complicated by
development of solitary episode of self-limited hemoptysis.

## 2019-11-15 NOTE — Patient Instructions (Signed)
Medication Instructions:  NO CHANGE *If you need a refill on your cardiac medications before your next appointment, please call your pharmacy*   Lab Work: Your physician recommends that you HAVE LAB WORK TODAY If you have labs (blood work) drawn today and your tests are completely normal, you will receive your results only by: Marland Kitchen MyChart Message (if you have MyChart) OR . A paper copy in the mail If you have any lab test that is abnormal or we need to change your treatment, we will call you to review the results.   Follow-Up: At [facility], you and your health needs are our priority.  As part of our continuing mission to provide you with exceptional heart care, we have created designated Provider Care Teams.  These Care Teams include your primary Cardiologist (physician) and Advanced Practice Providers (APPs -  Physician Assistants and Nurse Practitioners) who all work together to provide you with the care you need, when you need it.  We recommend signing up for the patient portal called "MyChart".  Sign up information is provided on this After Visit Summary.  MyChart is used to connect with patients for Virtual Visits (Telemedicine).  Patients are able to view lab/test results, encounter notes, upcoming appointments, etc.  Non-urgent messages can be sent to your provider as well.   To learn more about what you can do with MyChart, go to [email or web address].    Your next appointment:   3 month(s)  The format for your next appointment:   In Person  Provider:   [name], MD

## 2019-11-16 ENCOUNTER — Other Ambulatory Visit: Payer: Self-pay | Admitting: Cardiology

## 2019-11-16 ENCOUNTER — Encounter: Payer: Self-pay | Admitting: *Deleted

## 2019-11-16 LAB — BASIC METABOLIC PANEL
BUN/Creatinine Ratio: 24 (ref 12–28)
BUN: 42 mg/dL — ABNORMAL HIGH (ref 8–27)
CO2: 27 mmol/L (ref 20–29)
Calcium: 10.1 mg/dL (ref 8.7–10.3)
Chloride: 98 mmol/L (ref 96–106)
Creatinine, Ser: 1.75 mg/dL — ABNORMAL HIGH (ref 0.57–1.00)
GFR calc Af Amer: 32 mL/min/{1.73_m2} — ABNORMAL LOW (ref >59)
GFR calc non Af Amer: 28 mL/min/{1.73_m2} — ABNORMAL LOW (ref >59)
Glucose: 386 mg/dL — ABNORMAL HIGH (ref 65–99)
Potassium: 4.7 mmol/L (ref 3.5–5.2)
Sodium: 140 mmol/L (ref 134–144)

## 2019-11-17 DIAGNOSIS — J9611 Chronic respiratory failure with hypoxia: Secondary | ICD-10-CM | POA: Diagnosis not present

## 2019-11-17 DIAGNOSIS — K2100 Gastro-esophageal reflux disease with esophagitis, without bleeding: Secondary | ICD-10-CM | POA: Diagnosis not present

## 2019-11-17 DIAGNOSIS — Z955 Presence of coronary angioplasty implant and graft: Secondary | ICD-10-CM | POA: Diagnosis not present

## 2019-11-17 DIAGNOSIS — C3411 Malignant neoplasm of upper lobe, right bronchus or lung: Secondary | ICD-10-CM | POA: Diagnosis not present

## 2019-11-17 DIAGNOSIS — M1030 Gout due to renal impairment, unspecified site: Secondary | ICD-10-CM | POA: Diagnosis not present

## 2019-11-17 DIAGNOSIS — G4730 Sleep apnea, unspecified: Secondary | ICD-10-CM | POA: Diagnosis not present

## 2019-11-17 DIAGNOSIS — I5033 Acute on chronic diastolic (congestive) heart failure: Secondary | ICD-10-CM | POA: Diagnosis not present

## 2019-11-17 DIAGNOSIS — J449 Chronic obstructive pulmonary disease, unspecified: Secondary | ICD-10-CM | POA: Diagnosis not present

## 2019-11-17 DIAGNOSIS — I4819 Other persistent atrial fibrillation: Secondary | ICD-10-CM | POA: Diagnosis not present

## 2019-11-17 DIAGNOSIS — N186 End stage renal disease: Secondary | ICD-10-CM | POA: Diagnosis not present

## 2019-11-17 DIAGNOSIS — Z794 Long term (current) use of insulin: Secondary | ICD-10-CM | POA: Diagnosis not present

## 2019-11-17 DIAGNOSIS — Z7901 Long term (current) use of anticoagulants: Secondary | ICD-10-CM | POA: Diagnosis not present

## 2019-11-17 DIAGNOSIS — N390 Urinary tract infection, site not specified: Secondary | ICD-10-CM | POA: Diagnosis not present

## 2019-11-17 DIAGNOSIS — E1122 Type 2 diabetes mellitus with diabetic chronic kidney disease: Secondary | ICD-10-CM | POA: Diagnosis not present

## 2019-11-17 DIAGNOSIS — H9190 Unspecified hearing loss, unspecified ear: Secondary | ICD-10-CM | POA: Diagnosis not present

## 2019-11-17 DIAGNOSIS — K8020 Calculus of gallbladder without cholecystitis without obstruction: Secondary | ICD-10-CM | POA: Diagnosis not present

## 2019-11-17 DIAGNOSIS — K5730 Diverticulosis of large intestine without perforation or abscess without bleeding: Secondary | ICD-10-CM | POA: Diagnosis not present

## 2019-11-17 DIAGNOSIS — I2510 Atherosclerotic heart disease of native coronary artery without angina pectoris: Secondary | ICD-10-CM | POA: Diagnosis not present

## 2019-11-17 DIAGNOSIS — I69320 Aphasia following cerebral infarction: Secondary | ICD-10-CM | POA: Diagnosis not present

## 2019-11-17 DIAGNOSIS — E785 Hyperlipidemia, unspecified: Secondary | ICD-10-CM | POA: Diagnosis not present

## 2019-11-17 DIAGNOSIS — Z992 Dependence on renal dialysis: Secondary | ICD-10-CM | POA: Diagnosis not present

## 2019-11-17 DIAGNOSIS — Z87891 Personal history of nicotine dependence: Secondary | ICD-10-CM | POA: Diagnosis not present

## 2019-11-17 DIAGNOSIS — Z940 Kidney transplant status: Secondary | ICD-10-CM | POA: Diagnosis not present

## 2019-11-17 DIAGNOSIS — D509 Iron deficiency anemia, unspecified: Secondary | ICD-10-CM | POA: Diagnosis not present

## 2019-11-17 DIAGNOSIS — I152 Hypertension secondary to endocrine disorders: Secondary | ICD-10-CM | POA: Diagnosis not present

## 2019-11-19 ENCOUNTER — Other Ambulatory Visit: Payer: Self-pay

## 2019-11-19 ENCOUNTER — Telehealth: Payer: Self-pay | Admitting: Internal Medicine

## 2019-11-19 DIAGNOSIS — I5033 Acute on chronic diastolic (congestive) heart failure: Secondary | ICD-10-CM | POA: Diagnosis not present

## 2019-11-19 DIAGNOSIS — N186 End stage renal disease: Secondary | ICD-10-CM | POA: Diagnosis not present

## 2019-11-19 DIAGNOSIS — M1030 Gout due to renal impairment, unspecified site: Secondary | ICD-10-CM | POA: Diagnosis not present

## 2019-11-19 DIAGNOSIS — I2510 Atherosclerotic heart disease of native coronary artery without angina pectoris: Secondary | ICD-10-CM | POA: Diagnosis not present

## 2019-11-19 DIAGNOSIS — E1122 Type 2 diabetes mellitus with diabetic chronic kidney disease: Secondary | ICD-10-CM | POA: Diagnosis not present

## 2019-11-19 DIAGNOSIS — I152 Hypertension secondary to endocrine disorders: Secondary | ICD-10-CM | POA: Diagnosis not present

## 2019-11-19 NOTE — Patient Outreach (Signed)
[facility]) Care Management  11/19/2019  [name] [birth year] [id number]   Placed call to patient for telephone assessment and possible case closure.  No answer. Left a message requesting a call back.   Tomasa Rand, RN, BSN, CEN [facility] [employer] [phone]

## 2019-11-19 NOTE — Telephone Encounter (Signed)
New message:   [name] is calling from [facility] to inform us of the pt's blood sugar reading at 400 fasting.She also states the pt's R leg is swollen and bruise like from using "pain away". Please advise.

## 2019-11-22 ENCOUNTER — Ambulatory Visit (INDEPENDENT_AMBULATORY_CARE_PROVIDER_SITE_OTHER): Payer: Medicare Other | Admitting: Cardiology

## 2019-11-22 ENCOUNTER — Other Ambulatory Visit: Payer: Self-pay

## 2019-11-22 ENCOUNTER — Telehealth: Payer: Self-pay | Admitting: Cardiology

## 2019-11-22 ENCOUNTER — Telehealth: Payer: Self-pay | Admitting: Internal Medicine

## 2019-11-22 DIAGNOSIS — M1030 Gout due to renal impairment, unspecified site: Secondary | ICD-10-CM | POA: Diagnosis not present

## 2019-11-22 DIAGNOSIS — I152 Hypertension secondary to endocrine disorders: Secondary | ICD-10-CM | POA: Diagnosis not present

## 2019-11-22 DIAGNOSIS — I480 Paroxysmal atrial fibrillation: Secondary | ICD-10-CM | POA: Diagnosis not present

## 2019-11-22 DIAGNOSIS — E1122 Type 2 diabetes mellitus with diabetic chronic kidney disease: Secondary | ICD-10-CM | POA: Diagnosis not present

## 2019-11-22 DIAGNOSIS — Z5181 Encounter for therapeutic drug level monitoring: Secondary | ICD-10-CM

## 2019-11-22 DIAGNOSIS — N186 End stage renal disease: Secondary | ICD-10-CM | POA: Diagnosis not present

## 2019-11-22 DIAGNOSIS — I5033 Acute on chronic diastolic (congestive) heart failure: Secondary | ICD-10-CM | POA: Diagnosis not present

## 2019-11-22 DIAGNOSIS — I2510 Atherosclerotic heart disease of native coronary artery without angina pectoris: Secondary | ICD-10-CM | POA: Diagnosis not present

## 2019-11-22 LAB — POCT INR: INR: 1.6 — AB (ref 2.0–3.0)

## 2019-11-22 NOTE — Telephone Encounter (Signed)
Pt c/o swelling: STAT is pt has developed SOB within 24 hours  1) How much weight have you gained and in what time span? None - states there has been no weight gain since yesterday.  2) If swelling, where is the swelling located? Lower right leg. Calf is red and having drainage. Nurse is afraid of cellulitis  3) Are you currently taking a fluid pill? yes  4) Are you currently SOB? No - patient is already on oxygen.  5) Do you have a log of your daily weights (if so, list)? no  6) Have you gained 3 pounds in a day or 5 pounds in a week? no  7) Have you traveled recently? No   Katharine Look, patient's home health nurse states that the patient's INR today was 1.6. Please reach out to patient and if you need to you can contact Katharine Look as well. Patient's phone number is [phone]

## 2019-11-22 NOTE — Telephone Encounter (Signed)
Agree with PCP or urgernt care to assess possible cellulitis.  See anti-coagulation note for additional warfarin dose and monitoring.

## 2019-11-22 NOTE — Patient Outreach (Signed)
[facility] [facility]) Care Management  11/22/2019  Cherisa Brucker [birth year] [id number]   Telephone assessment: Today's Vitals   11/22/19 1235  Weight: 159 lb (72.1 kg)    Placed call to patient who answered and reports she is doing ok. Reports she has been having significant pain in her legs. Reports she did not sleep well yesterday. At this time ( 12:!5pm) patient is still in the bed and reports she missed her renal MD appointment this morning.  Patient reports her leg is not swollen and she has not gained any weight. Report just pain in legs.  Reports CBG has been high this week.  Today reading while on the phone was 366 non fasting. Has not yet taken her insulin. States she ate [place] sausage, egg, toast and cheese grits for breakfast.  Reports she has not missed any insulin doses but states she is taking 24 units. Upon review of medication, patient is supposed to increased by 1 unit per day until CBG is 100-130 fasting.  Reviewed this with patient and she seems to have difficulty understanding this order.   PLAN:  Reviewed with patient that she seems to be managing her CHF well. Reviewed concern for DM.  Will plan to call patient again in the morning to see what fasting CBG is and review correct dose of insulin to take based on medication list.  Tomasa Rand, RN, BSN, CEN [place] Coordinator [phone]

## 2019-11-22 NOTE — Telephone Encounter (Signed)
Noted, pt has appt tomorrow with Mickel Baas

## 2019-11-22 NOTE — Telephone Encounter (Signed)
Left message for patient to call back to instruct on Coumadin dosing. Patient to take 2.5 mg (5 mg x 0.5) every Mon, Wed, Fri; and 5 mg (5 mg x 1) all other days. Will await return call.

## 2019-11-22 NOTE — Telephone Encounter (Signed)
Spoke with patient. Patient's right lower leg above her ankle is red. It has clear drainage coming out of the skin. Her leg is red and blue in color. It is swollen but there has been no weight gain. Home health nurse is concerned about cellulitis. Patient instructed to call PCP office for appointment to assess need for antibiotics if cellulitis related.   Patient also called to let us know that her INR is 1.6. Will route to pharmacist for review.

## 2019-11-22 NOTE — Telephone Encounter (Signed)
[name] with [facility] and called and said that the patient was complaining of leg pain in her right leg and that it is red, swelling and clear drainage, this has been going on for a week.

## 2019-11-23 ENCOUNTER — Ambulatory Visit (INDEPENDENT_AMBULATORY_CARE_PROVIDER_SITE_OTHER): Payer: Medicare Other | Admitting: Family

## 2019-11-23 ENCOUNTER — Encounter: Payer: Self-pay | Admitting: Family

## 2019-11-23 ENCOUNTER — Other Ambulatory Visit: Payer: Self-pay

## 2019-11-23 VITALS — BP 142/74 | HR 89 | Temp 98.3°F | Ht 60.0 in | Wt 159.0 lb

## 2019-11-23 DIAGNOSIS — I5033 Acute on chronic diastolic (congestive) heart failure: Secondary | ICD-10-CM | POA: Diagnosis not present

## 2019-11-23 DIAGNOSIS — I2510 Atherosclerotic heart disease of native coronary artery without angina pectoris: Secondary | ICD-10-CM | POA: Diagnosis not present

## 2019-11-23 DIAGNOSIS — E1122 Type 2 diabetes mellitus with diabetic chronic kidney disease: Secondary | ICD-10-CM | POA: Diagnosis not present

## 2019-11-23 DIAGNOSIS — L03115 Cellulitis of right lower limb: Secondary | ICD-10-CM | POA: Diagnosis not present

## 2019-11-23 DIAGNOSIS — I152 Hypertension secondary to endocrine disorders: Secondary | ICD-10-CM | POA: Diagnosis not present

## 2019-11-23 DIAGNOSIS — N186 End stage renal disease: Secondary | ICD-10-CM | POA: Diagnosis not present

## 2019-11-23 DIAGNOSIS — M1030 Gout due to renal impairment, unspecified site: Secondary | ICD-10-CM | POA: Diagnosis not present

## 2019-11-23 MED ORDER — CEPHALEXIN 500 MG PO CAPS
500.0000 mg | ORAL_CAPSULE | Freq: Three times a day (TID) | ORAL | 0 refills | Status: DC
Start: 2019-11-23 — End: 2019-11-28

## 2019-11-23 MED ORDER — HYDROCODONE-ACETAMINOPHEN 5-325 MG PO TABS: 1.0000 | ORAL_TABLET | Freq: Four times a day (QID) | ORAL | 0 refills | Status: DC | PRN

## 2019-11-23 MED ORDER — TORSEMIDE 20 MG PO TABS: 60.0000 mg | ORAL_TABLET | Freq: Every day | ORAL | 0 refills | Status: DC

## 2019-11-23 NOTE — Progress Notes (Signed)
[name] is a 74 y.o. female with the following history as recorded in EpicCare:  Patient Active Problem List   Diagnosis Date Noted  . Heart failure ([place]) 10/02/2019  . Dyspnea 08/20/2019  . COPD (chronic obstructive pulmonary disease) ([place]) 08/20/2019  . Hematoma of arm, left, subsequent encounter 07/23/2019  . Obesity (BMI 30-39.9) 12/20/2018  . Acute respiratory failure with hypoxia ([place]) 12/19/2018  . Gait disorder 11/22/2018  . History of seizure disorder 09/12/2018  . History of stroke 09/12/2018  . Edema 08/28/2018  . Hypertension secondary to other renal disorders 03/22/2018  . Proteinuria 03/22/2018  . Malignant neoplasm of lung ([place]) 03/22/2018  . Breast pain in female 02/04/2018  . Lung cancer ([place]) 11/30/2017  . Adenocarcinoma of lung, stage 1, right ([place]) 11/25/2017  . Chronic respiratory failure with hypoxia ([place]) 12/27/2016  . Acute on chronic diastolic CHF (congestive heart failure) ([place]) 12/27/2016  . Left upper extremity swelling 12/27/2016  . Pain of left breast 12/27/2016  . Arm muscle atrophy 11/16/2016  . Insomnia 02/25/2016  . Renal cyst 11/21/2015  . Renal mass, right 11/10/2015  . Shoulder pain, right 08/13/2015  . Weight gain 08/12/2014  . Multinodular goiter 05/01/2014  . Encounter for therapeutic drug monitoring 08/14/2013  . S/P aortic valve replacement with bioprosthetic valve and maze procedure 04/12/2013  . Nodule of right lung 04/07/2013  . Lung nodule seen on imaging study 04/07/2013  . Pain in limb 05/22/2012  . Dermatitis 01/26/2012  . Long term (current) use of anticoagulants 08/17/2011  . Anemia in chronic renal disease 05/13/2011  . Hypokalemia 04/26/2011  . Immunosuppression ([place]) 04/26/2011  . Hypertension associated with diabetes ([employer]) 04/26/2011  . Renal transplant recipient 03/16/2011  . Low back pain 02/23/2011  . Hypersalivation 11/24/2010  . AORTIC STENOSIS 08/20/2010  . DISTURBANCE OF SALIVARY SECRETION 07/28/2010  .  NAUSEA 04/14/2010  . SMOKER 10/07/2009  . ALOPECIA 10/07/2009  . CAROTID STENOSIS 03/04/2009  . AF (paroxysmal atrial fibrillation) ([place]) 12/31/2008  . NEOPLASM, MALIGNANT, KIDNEY 07/02/2008  . APHASIA DUE TO CEREBROVASCULAR DISEASE 07/02/2008  . Chronic fatigue 05/21/2008  . Hyperlipidemia LDL goal <70 09/27/2007  . Constipation 09/27/2007  . SLEEP APNEA 09/27/2007  . CHOLELITHIASIS 06/09/2007  . HELICOBACTER PYLORI INFECTION, HX OF 06/08/2007  . Type 2 diabetes mellitus with renal manifestations ([place]) 05/23/2007  . Gout 05/23/2007  . 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 MYOCARDIAL INFARCTION, HX OF 05/23/2007  . Coronary atherosclerosis 05/23/2007  . GERD 05/23/2007  . COLONIC POLYPS, HX OF 05/23/2007  . DIVERTICULOSIS, COLON 07/01/2005    Current Outpatient Medications  Medication Sig Dispense Refill  . acetaminophen (TYLENOL) 500 MG tablet Take 500 mg by mouth every 6 (six) hours as needed for mild pain or headache.    . dapsone 25 MG tablet Take 25 mg by mouth daily.     Marland Kitchen gabapentin (NEURONTIN) 300 MG capsule Take 1 capsule (300 mg total) by mouth 3 (three) times daily. 90 capsule 5  . HYDROcodone-acetaminophen (NORCO/VICODIN) 5-325 MG tablet Take 1 tablet by mouth every 6 (six) hours as needed for severe pain. 20 tablet 0  . Insulin Glargine, 1 Unit Dial, (TOUJEO SOLOSTAR) 300 UNIT/ML SOPN Inject 24 Units into the skin every morning. Titrate up by 1 unit a day if needed for goal sugars of 100-130 up to 30 units a day max 3 pen 11  . Insulin Pen Needle (B-D UF III MINI PEN NEEDLES) 31G X 5 MM MISC USE TO ADMINISTER INSULIN  FOUR TIMES A DAY DX E11.9 100 each 1  . levothyroxine (SYNTHROID) 25 MCG tablet TAKE 2 TABLETS (50 MCG TOTAL) BY MOUTH DAILY. (Patient taking differently: Take 50 mcg by mouth daily before breakfast. ) 180 tablet 3  . metoprolol tartrate (LOPRESSOR) 25 MG tablet Take 0.5 tablets (12.5 mg total) by mouth 2 (two) times daily. 90 tablet 3  .  ONETOUCH DELICA LANCETS 40J MISC Use to check blood sugars three times a day DX E11.9 100 each 5  . ONETOUCH VERIO test strip USE TO TEST 3 TIMES DAILY. DX E11.9 (Patient taking differently: 1 each by Other route 3 (three) times daily. DX: E11.9) 100 each 5  . OXYGEN Inhale 3 L into the lungs as needed (for shortness of breath).     . potassium chloride SA (KLOR-CON M20) 20 MEQ tablet Take 1 tablet (20 mEq total) by mouth daily. (Patient taking differently: Take 40 mEq by mouth daily. ) 30 tablet 3  . predniSONE (DELTASONE) 5 MG tablet Take 5 mg by mouth daily.   5  . Propylene Glycol (SYSTANE BALANCE) 0.6 % SOLN Place 1-2 drops into both eyes 3 (three) times daily as needed (for dryness).    . repaglinide (PRANDIN) 0.5 MG tablet Take 0.5 mg by mouth 3 (three) times daily before meals.    . rosuvastatin (CRESTOR) 20 MG tablet Take 1 tablet (20 mg total) by mouth daily. (Patient taking differently: Take 20 mg by mouth every other day. ) 90 tablet 3  . tacrolimus (PROGRAF) 1 MG capsule Take 3 mg by mouth 2 (two) times daily.     Marland Kitchen warfarin (COUMADIN) 5 MG tablet TAKE 1/2 TO 1 TABLET BY MOUTH DAILY AS DIRECTED BY COUMADIN CLINIC 90 tablet 1  . cephALEXin (KEFLEX) 500 MG capsule Take 1 capsule (500 mg total) by mouth 3 (three) times daily. 10 capsule 0  . torsemide (DEMADEX) 20 MG tablet Take 3 tablets (60 mg total) by mouth daily. 90 tablet 0   No current facility-administered medications for this visit.    Allergies: Ibuprofen, Sulfamethoxazole-trimethoprim, Sulfonamide derivatives, Tape, Tramadol, Doxycycline, Hydrocil [psyllium], Bactrim, and Red dye  Past Medical History:  Diagnosis Date  . Acute CHF (congestive heart failure) ([place]) 12/2018  . Adenocarcinoma of lung, stage 1, right ([place]) 11/25/2017  . Anemia, iron deficiency    of chronic disease  . Aortic stenosis    a. Severe AS by echo 11/2012.  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 Asystole ([place])    a. During ENT  surgery 2005: developed marked asystole requiring CPR, felt due to vagal reaction (cath nonobst dz).  . Carotid artery disease ([place])    a. Carotid Dopplers performed in August 2013 showed 40-59% left stenosis and 0-39% right; f/u recommended in 2 years.   . Cerebrovascular accident [facility]) 2009   a. LMCA infarct felt embolic 8119, maintained on chronic coumadin.; denies residual on 04/05/2013  . Cholelithiasis   . Chronic Persistent Atrial Fibrillation 12/31/2008   Qualifier: Diagnosis of  By: [name]    . Coronary artery disease 05/2002   a. Ant MI 2003 s/p PTCA/stent to RCA.   . Diverticulosis of colon   . Esophagitis, reflux   . ESRD (end stage renal disease) ([place])    a. Mass on L kidney per pt s/p nephrectomy - pt states not cancer - [facility] notes indicate ESRD due to HTN/DM - was previously on HD. b. Kidney transplant 02/2011.  Marland Kitchen GERD (  gastroesophageal reflux disease)   . Gout   . Hearing loss   . Helicobacter pylori (H. pylori) infection    hx of  . Hemorrhoids   . Hx of colonic polyps    adenomatous  . Hyperlipidemia   . Hypertension   . Lung nodule seen on imaging study 04/07/2013   1.0 cm ground glass opacity RUL  . Myocardial infarction ([place]) 2003  . Pericardial effusion    a. Small by echo 11/2011.  . S/P aortic valve replacement with bioprosthetic valve and maze procedure 04/12/2013   93mm [facility] Ease bovine pericardial tissue valve   . S/P Maze operation for atrial fibrillation 04/12/2013   Complete bilateral atrial lesion set using bipolar radiofrequency and cryothermy ablation with clipping of LA appendage  . Sleep apnea    Pt says testing was positive, intolerant of CPAP.  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  . Stroke ([place])   . Type II diabetes mellitus ([place])     Past Surgical History:   Procedure Laterality Date  . AORTIC VALVE REPLACEMENT N/A 04/12/2013   Procedure: AORTIC VALVE REPLACEMENT (AVR);  Surgeon: Rexene Alberts, MD;  Location: [place];  Service: Open Heart Surgery;  Laterality: N/A;  . ARTERIOVENOUS GRAFT PLACEMENT Left   . ARTERIOVENOUS GRAFT PLACEMENT Left    "I've had 2 on my left; had one removed" (04/05/2013)   . ARTERY EXPLORATION Right 04/11/2013   Procedure: ARTERY EXPLORATION;  Surgeon: Rexene Alberts, MD;  Location: [facility];  Service: Open Heart Surgery;  Laterality: Right;  Right carotid artery exploration  . AV FISTULA PLACEMENT Right   . AV FISTULA REPAIR Right    "took it out" ((/18/2014)  . CARDIOVERSION  05/29/2012   Procedure: CARDIOVERSION;  Surgeon: Lelon Perla, MD;  Location: [facility] ENDOSCOPY;  Service: Cardiovascular;  Laterality: N/A;  . CHOLECYSTECTOMY  2009   with hernia removal  . CORONARY ANGIOPLASTY WITH STENT PLACEMENT Right    coronary artery  . INSERTION OF DIALYSIS CATHETER Bilateral    "over the years; took them both out" (04/05/2013)  . INTRAOPERATIVE TRANSESOPHAGEAL ECHOCARDIOGRAM N/A 04/11/2013   Procedure: INTRAOPERATIVE TRANSESOPHAGEAL ECHOCARDIOGRAM;  Surgeon: Rexene Alberts, MD;  Location: [facility];  Service: Open Heart Surgery;  Laterality: N/A;  . INTRAOPERATIVE TRANSESOPHAGEAL ECHOCARDIOGRAM N/A 04/12/2013   Procedure: INTRAOPERATIVE TRANSESOPHAGEAL ECHOCARDIOGRAM;  Surgeon: Rexene Alberts, MD;  Location: [place];  Service: Open Heart Surgery;  Laterality: N/A;  . KIDNEY TRANSPLANT  03/16/11  . LEFT AND RIGHT HEART CATHETERIZATION WITH CORONARY ANGIOGRAM N/A 04/06/2013   Procedure: LEFT AND RIGHT HEART CATHETERIZATION WITH CORONARY ANGIOGRAM;  Surgeon: Blane Ohara, MD;  Location[facility] CATH LAB;  Service: Cardiovascular;  Laterality: N/A;  . MAZE N/A 04/12/2013   Procedure: MAZE;  Surgeon: Rexene Alberts, MD;  Location: [facility];  Service: Open Heart Surgery;  Laterality: N/A;  . NASAL RECONSTRUCTION WITH SEPTAL REPAIR      "took it out" (04/05/2013)  . NEPHRECTOMY Left 2010   no CA on bx  . TONSILLECTOMY    . TOTAL ABDOMINAL HYSTERECTOMY    . TUBAL LIGATION      Family History  Problem Relation Age of Onset  . Stroke Father   . Hypertension Mother   . Diabetes Other   . Diabetes Maternal Grandmother   . Diabetes Son   .  Crohn's disease Other        grandson   . Breast cancer Neg Hx   . Stomach cancer Neg Hx   . Esophageal cancer Neg Hx   . Colon cancer Neg Hx   . Pancreatic cancer Neg Hx     Social History   Tobacco Use  . Smoking status: Former Smoker    Packs/day: 1.00    Years: 30.00    Pack years: 30.00    Types: Cigarettes    Quit date: 07/19/2001    Years since quitting: 18.3  . Smokeless tobacco: Never Used  Substance Use Topics  . Alcohol use: No    Subjective:  Accompanied by husband and grandson; was told by home health providers that she has cellulitis on right lower leg that needs to be treated; per patient, symptoms present for at least 1 1/2 week; notes that area of redness has actually started to improve but is leaking fluid; there is swelling noted in right lower leg- she notes she needs a refill on her Torsemide; does take daily Coumadin and has been taking this regularly; seen in wheelchair today;   Objective:  Vitals:   11/23/19 1109  BP: (!) 142/74  Pulse: 89  Temp: 98.3 F (36.8 C)  TempSrc: Oral  SpO2: 95%  Weight: 159 lb (72.1 kg)  Height: 5' (1.524 m)    General: Well developed, well nourished, in no acute distress  Skin : Warm and dry.  Head: Normocephalic and atraumatic  Lungs: Respirations unlabored; on oxygen; Musculoskeletal: No deformities; no active joint inflammation  Extremities: + edema- greater than right than left, cyanosis, clubbing; area c/w cellulitis noted on lower calf with some fluid leaking Vessels: Symmetric bilaterally  Neurologic: Alert and oriented; speech intact; face symmetrical; moves all extremities well; CNII-XII intact without  focal deficit   Assessment:  1. Cellulitis of right lower extremity     Plan:  Continue Demadex- short term refill updated; Rx for Keflex 500 mg tid x 10 days- per patient, she has taken this before and done well; #20 Norco refilled due to pain; follow-up worse, no better.  This visit occurred during the SARS-CoV-2 public health emergency.  Safety protocols were in place, including screening questions prior to the visit, additional usage of staff PPE, and extensive cleaning of exam room while observing appropriate contact time as indicated for disinfecting solutions.     No follow-ups on file.  No orders of the defined types were placed in this encounter.   Requested Prescriptions   Signed Prescriptions Disp Refills  . torsemide (DEMADEX) 20 MG tablet 90 tablet 0    Sig: Take 3 tablets (60 mg total) by mouth daily.  . cephALEXin (KEFLEX) 500 MG capsule 10 capsule 0    Sig: Take 1 capsule (500 mg total) by mouth 3 (three) times daily.  Marland Kitchen HYDROcodone-acetaminophen (NORCO/VICODIN) 5-325 MG tablet 20 tablet 0    Sig: Take 1 tablet by mouth every 6 (six) hours as needed for severe pain.

## 2019-11-23 NOTE — Patient Outreach (Signed)
[facility]) Care Management  11/23/2019  [name] [birth year] [id number]   Telephone assessment and follow up from yesterdays telephone assessment:  Today's Vitals   11/23/19 1016  Weight: 159 lb (72.1 kg)    Placed call to patient who sounds short of breath on the phone. Reports she is trying to get ready to go see MD.  Reports she has cellulitis in her leg.  Reports CBG today of 357, however she had eaten toast, oatmeal and coffee.  She has not taken her insulin as of yet and I suggested her to take 26 units according to order from MD to increase daily based on CBG reading.    Patient admits she is very confused about her insulin doses. I suggested she discuss at her MD appointment today.  PLAN: will reviewed Md office visit notes and call patient back on Monday.  Tomasa Rand, RN, BSN, CEN [facility] [employer] [phone]

## 2019-11-26 ENCOUNTER — Other Ambulatory Visit: Payer: Self-pay

## 2019-11-26 ENCOUNTER — Other Ambulatory Visit: Payer: Self-pay | Admitting: Internal Medicine

## 2019-11-26 NOTE — Telephone Encounter (Signed)
Left message for pt to call.

## 2019-11-26 NOTE — Patient Outreach (Signed)
[name]) Care Management  11/26/2019  [name] 1945-10-25 [id number]   Telephone assessment:  Placed call to patient and reviewed reason for call to follow up on CBG since last weeks telephone call. Patient reports CBG of 407 today, fasting.  States cellulitis is improving but she has completed her antibiotics.  Reports she is having difficulty managing her DM and understanding when to increase her insulin doses.  PLAN: will send an in basket message to MD to get recommendations.  Tomasa Rand, RN, BSN, CEN [facility] [employer] [phone]

## 2019-11-27 ENCOUNTER — Other Ambulatory Visit: Payer: Self-pay

## 2019-11-27 NOTE — Patient Outreach (Signed)
[facility]) Care Management  11/27/2019  Debie Ashline 1945-10-26 [id number]    Placed call to patient to assess CBG today. Patient reports CBG of 401.  Non fasting, ate sausage biscuit this am.  Reviewed with client to take 30 units of insulin today as per MD order.  Patient states she has been to a endocrinologist before and could not understand the regimen. Will send basket message to MD and call patient again tomorrow.  Tomasa Rand, RN, BSN, CEN [facility] [employer] [phone]

## 2019-11-28 ENCOUNTER — Other Ambulatory Visit: Payer: Self-pay

## 2019-11-28 ENCOUNTER — Other Ambulatory Visit: Payer: Self-pay | Admitting: Internal Medicine

## 2019-11-28 ENCOUNTER — Ambulatory Visit (INDEPENDENT_AMBULATORY_CARE_PROVIDER_SITE_OTHER): Payer: Medicare Other | Admitting: Cardiovascular Disease

## 2019-11-28 DIAGNOSIS — N186 End stage renal disease: Secondary | ICD-10-CM | POA: Diagnosis not present

## 2019-11-28 DIAGNOSIS — Z5181 Encounter for therapeutic drug level monitoring: Secondary | ICD-10-CM | POA: Diagnosis not present

## 2019-11-28 DIAGNOSIS — M1030 Gout due to renal impairment, unspecified site: Secondary | ICD-10-CM | POA: Diagnosis not present

## 2019-11-28 DIAGNOSIS — E1122 Type 2 diabetes mellitus with diabetic chronic kidney disease: Secondary | ICD-10-CM | POA: Diagnosis not present

## 2019-11-28 DIAGNOSIS — I152 Hypertension secondary to endocrine disorders: Secondary | ICD-10-CM | POA: Diagnosis not present

## 2019-11-28 DIAGNOSIS — I480 Paroxysmal atrial fibrillation: Secondary | ICD-10-CM

## 2019-11-28 DIAGNOSIS — I2510 Atherosclerotic heart disease of native coronary artery without angina pectoris: Secondary | ICD-10-CM | POA: Diagnosis not present

## 2019-11-28 DIAGNOSIS — I5033 Acute on chronic diastolic (congestive) heart failure: Secondary | ICD-10-CM | POA: Diagnosis not present

## 2019-11-28 LAB — POCT INR: INR: 1.3 — AB (ref 2.0–3.0)

## 2019-11-28 MED ORDER — TOUJEO SOLOSTAR 300 UNIT/ML ~~LOC~~ SOPN: 34.0000 [IU] | PEN_INJECTOR | Freq: Every morning | SUBCUTANEOUS | 11 refills | Status: DC

## 2019-11-28 NOTE — Telephone Encounter (Signed)
See anti-coag visit

## 2019-11-28 NOTE — Patient Outreach (Addendum)
[facility]) Care Management  11/28/2019  [name] 1946-05-15 [id number]   Telephone follow up: Today's Vitals   11/28/19 1027  Weight: 155 lb (70.3 kg)    Placed call to patient to assess CBG.  Patient reports fasting CBG today of 420. States she took 31 units.  Reports she normally drinks Ensure but did not yesterday because of her CBG levels.  Reports legs is much improved with no pain in leg today.  Reports weight unchanged.  PLAN: will notify MD via In basket and awaiting for recommendations.  Encouraged patient to stay hydrated and follow her diet.  Tomasa Rand, RN, BSN, CEN [facility] [employer] [phone]

## 2019-11-29 ENCOUNTER — Other Ambulatory Visit: Payer: Self-pay

## 2019-11-29 NOTE — Patient Outreach (Signed)
[place] [name]) Care Management  11/29/2019  Kadi Hession 1945-07-31 [id number]   Telephone assessment:  Today's Vitals   11/29/19 0949  Weight: 155 lb (70.3 kg)     Placed call to patient and reviewed reason for follow up call. Patient reports she is still in the bed. While on the phone patient was able to weigh and check fasting CBG. Fast CBG was 250.  Reviewed with patient in basket message from MD to increase insulin to 34 units per day. Patient voiced understanding.  PLAN: will follow up in 1 week.  Tomasa Rand, RN, BSN, CEN [facility] [employer] [phone]

## 2019-11-30 DIAGNOSIS — N189 Chronic kidney disease, unspecified: Secondary | ICD-10-CM | POA: Diagnosis not present

## 2019-11-30 DIAGNOSIS — Z940 Kidney transplant status: Secondary | ICD-10-CM | POA: Diagnosis not present

## 2019-12-03 DIAGNOSIS — M1030 Gout due to renal impairment, unspecified site: Secondary | ICD-10-CM | POA: Diagnosis not present

## 2019-12-03 DIAGNOSIS — I5033 Acute on chronic diastolic (congestive) heart failure: Secondary | ICD-10-CM | POA: Diagnosis not present

## 2019-12-03 DIAGNOSIS — I152 Hypertension secondary to endocrine disorders: Secondary | ICD-10-CM | POA: Diagnosis not present

## 2019-12-03 DIAGNOSIS — I2510 Atherosclerotic heart disease of native coronary artery without angina pectoris: Secondary | ICD-10-CM | POA: Diagnosis not present

## 2019-12-03 DIAGNOSIS — E1122 Type 2 diabetes mellitus with diabetic chronic kidney disease: Secondary | ICD-10-CM | POA: Diagnosis not present

## 2019-12-03 DIAGNOSIS — N186 End stage renal disease: Secondary | ICD-10-CM | POA: Diagnosis not present

## 2019-12-05 ENCOUNTER — Other Ambulatory Visit: Payer: Self-pay

## 2019-12-05 ENCOUNTER — Encounter: Payer: Self-pay | Admitting: Internal Medicine

## 2019-12-05 ENCOUNTER — Ambulatory Visit (INDEPENDENT_AMBULATORY_CARE_PROVIDER_SITE_OTHER): Payer: Medicare Other | Admitting: Internal Medicine

## 2019-12-05 ENCOUNTER — Ambulatory Visit (INDEPENDENT_AMBULATORY_CARE_PROVIDER_SITE_OTHER): Payer: Medicare Other | Admitting: Cardiology

## 2019-12-05 DIAGNOSIS — N186 End stage renal disease: Secondary | ICD-10-CM | POA: Diagnosis not present

## 2019-12-05 DIAGNOSIS — M1030 Gout due to renal impairment, unspecified site: Secondary | ICD-10-CM | POA: Diagnosis not present

## 2019-12-05 DIAGNOSIS — I480 Paroxysmal atrial fibrillation: Secondary | ICD-10-CM

## 2019-12-05 DIAGNOSIS — E1122 Type 2 diabetes mellitus with diabetic chronic kidney disease: Secondary | ICD-10-CM | POA: Diagnosis not present

## 2019-12-05 DIAGNOSIS — Z794 Long term (current) use of insulin: Secondary | ICD-10-CM

## 2019-12-05 DIAGNOSIS — I2510 Atherosclerotic heart disease of native coronary artery without angina pectoris: Secondary | ICD-10-CM

## 2019-12-05 DIAGNOSIS — Z5181 Encounter for therapeutic drug level monitoring: Secondary | ICD-10-CM

## 2019-12-05 DIAGNOSIS — J9611 Chronic respiratory failure with hypoxia: Secondary | ICD-10-CM

## 2019-12-05 DIAGNOSIS — I5033 Acute on chronic diastolic (congestive) heart failure: Secondary | ICD-10-CM | POA: Diagnosis not present

## 2019-12-05 DIAGNOSIS — E1121 Type 2 diabetes mellitus with diabetic nephropathy: Secondary | ICD-10-CM | POA: Diagnosis not present

## 2019-12-05 DIAGNOSIS — R609 Edema, unspecified: Secondary | ICD-10-CM | POA: Diagnosis not present

## 2019-12-05 DIAGNOSIS — I152 Hypertension secondary to endocrine disorders: Secondary | ICD-10-CM | POA: Diagnosis not present

## 2019-12-05 LAB — POCT INR: INR: 2.5 (ref 2.0–3.0)

## 2019-12-05 MED ORDER — TORSEMIDE 100 MG PO TABS: 100.0000 mg | ORAL_TABLET | Freq: Every day | ORAL | 5 refills | Status: DC

## 2019-12-05 MED ORDER — OXYCODONE-ACETAMINOPHEN 5-325 MG PO TABS: 1.0000 | ORAL_TABLET | Freq: Three times a day (TID) | ORAL | 0 refills | Status: DC | PRN

## 2019-12-05 MED ORDER — TRIAMCINOLONE ACETONIDE 0.1 % EX CREA
1.0000 | TOPICAL_CREAM | Freq: Four times a day (QID) | CUTANEOUS | 1 refills | Status: DC
Start: 2019-12-05 — End: 2020-06-17

## 2019-12-05 MED ORDER — CEPHALEXIN 500 MG PO CAPS
500.0000 mg | ORAL_CAPSULE | Freq: Four times a day (QID) | ORAL | 0 refills | Status: DC
Start: 2019-12-05 — End: 2019-12-31

## 2019-12-05 NOTE — Assessment & Plan Note (Signed)
Worse Increase Demadex to 100 mg/d Go to ER if not better

## 2019-12-05 NOTE — Assessment & Plan Note (Signed)
On O2 

## 2019-12-05 NOTE — Progress Notes (Signed)
Subjective:  [name], female    DOB: [birth year]  Age: 74 y.o. MRN: [id number]  CC: No chief complaint on file.   HPI [facility], [place] presents for elevated sugar high around 200-400 C/o leg swelling R>L, cellulitis - took Keflex Insulin 34 u/d F/u on CHF, DM, HTN, CRF  A complex [name]  Outpatient Medications Prior to Visit  Medication Sig Dispense Refill  . acetaminophen (TYLENOL) 500 MG tablet Take 500 mg by mouth every 6 (six) hours as needed for mild pain or headache.    Marland Kitchen amoxicillin (AMOXIL) 500 MG capsule TAKE 4 CAPSULES BY MOUTH 1 HOUR PRIOR TO DENTAL WORK    . dapsone 25 MG tablet Take 25 mg by mouth daily.     Marland Kitchen gabapentin (NEURONTIN) 300 MG capsule Take 1 capsule (300 mg total) by mouth 3 (three) times daily. 90 capsule 5  . HYDROcodone-acetaminophen (NORCO/VICODIN) 5-325 MG tablet Take 1 tablet by mouth every 6 (six) hours as needed for severe pain. 20 tablet 0  . insulin glargine, 1 Unit Dial, (TOUJEO SOLOSTAR) 300 UNIT/ML Solostar Pen Inject 34 Units into the skin every morning. Titrate up by 1 unit a day if needed for goal sugars of 100-130 up to 30 units a day max 3 pen 11  . Insulin Pen Needle (B-D UF III MINI PEN NEEDLES) 31G X 5 MM MISC USE TO ADMINISTER INSULIN FOUR TIMES A DAY DX E11.9 100 each 1  . levothyroxine (SYNTHROID) 25 MCG tablet TAKE 2 TABLETS (50 MCG TOTAL) BY MOUTH DAILY. (Patient taking differently: Take 50 mcg by mouth daily before breakfast. ) 180 tablet 3  . metoprolol tartrate (LOPRESSOR) 25 MG tablet Take 0.5 tablets (12.5 mg total) by mouth 2 (two) times daily. 90 tablet 3  . ONETOUCH DELICA LANCETS 41Y MISC Use to check blood sugars three times a day DX E11.9 100 each 5  . ONETOUCH VERIO test strip USE TO TEST 3 TIMES DAILY. DX E11.9 100 strip 5  . OXYGEN Inhale 3 L into the lungs as needed (for shortness of breath).     . potassium chloride SA (KLOR-CON M20) 20 MEQ tablet Take 1 tablet (20 mEq total) by mouth daily. (Patient taking  differently: Take 40 mEq by mouth daily. ) 30 tablet 3  . predniSONE (DELTASONE) 5 MG tablet Take 5 mg by mouth daily.   5  . Propylene Glycol (SYSTANE BALANCE) 0.6 % SOLN Place 1-2 drops into both eyes 3 (three) times daily as needed (for dryness).    . repaglinide (PRANDIN) 0.5 MG tablet Take 0.5 mg by mouth 3 (three) times daily before meals.    . rosuvastatin (CRESTOR) 20 MG tablet Take 1 tablet (20 mg total) by mouth daily. (Patient taking differently: Take 20 mg by mouth every other day. ) 90 tablet 3  . tacrolimus (PROGRAF) 1 MG capsule Take 3 mg by mouth 2 (two) times daily.     Marland Kitchen torsemide (DEMADEX) 20 MG tablet Take 3 tablets (60 mg total) by mouth daily. 90 tablet 0  . warfarin (COUMADIN) 5 MG tablet TAKE 1/2 TO 1 TABLET BY MOUTH DAILY AS DIRECTED BY COUMADIN CLINIC 90 tablet 1   No facility-administered medications prior to visit.    ROS: Review of Systems  Constitutional: Positive for fatigue. Negative for activity change, appetite change, chills and unexpected weight change.  HENT: Negative for congestion, mouth sores and sinus pressure.   Eyes: Negative for visual disturbance.  Respiratory: Positive for shortness of  breath. Negative for cough and chest tightness.   Cardiovascular: Positive for leg swelling.  Gastrointestinal: Negative for abdominal pain and nausea.  Genitourinary: Negative for difficulty urinating, frequency and vaginal pain.  Musculoskeletal: Positive for back pain and gait problem.  Skin: Positive for color change, rash and wound. Negative for pallor.  Neurological: Positive for weakness. Negative for dizziness, tremors, numbness and headaches.  Hematological: Bruises/bleeds easily.  Psychiatric/Behavioral: Negative for confusion and sleep disturbance.    Objective:  BP 138/72 (BP Location: Right Arm, Patient Position: Sitting, Cuff Size: Large)   Pulse 63   Temp 98.9 F (37.2 C) (Oral)   Ht 5' (1.524 m)   Wt 161 lb (73 kg)   SpO2 96% Comment: 3  liters of o2  BMI 31.44 kg/m   BP Readings from Last 3 Encounters:  12/05/19 138/72  11/23/19 (!) 142/74  11/15/19 138/72    Wt Readings from Last 3 Encounters:  12/05/19 161 lb (73 kg)  11/29/19 155 lb (70.3 kg)  11/28/19 155 lb (70.3 kg)    Physical Exam Constitutional:      General: She is not in acute distress.    Appearance: She is well-developed.  HENT:     Head: Normocephalic.     Right Ear: External ear normal.     Left Ear: External ear normal.     Nose: Nose normal.  Eyes:     General:        Right eye: No discharge.        Left eye: No discharge.     Conjunctiva/sclera: Conjunctivae normal.     Pupils: Pupils are equal, round, and reactive to light.  Neck:     Thyroid: No thyromegaly.     Vascular: No JVD.     Trachea: No tracheal deviation.  Cardiovascular:     Rate and Rhythm: Normal rate and regular rhythm.     Heart sounds: Normal heart sounds.  Pulmonary:     Effort: No respiratory distress.     Breath sounds: No stridor. No wheezing.  Abdominal:     General: Bowel sounds are normal. There is no distension.     Palpations: Abdomen is soft. There is no mass.     Tenderness: There is abdominal tenderness. There is no guarding or rebound.  Musculoskeletal:        General: Swelling and tenderness present.     Cervical back: Normal range of motion and neck supple.     Right lower leg: Edema present.     Left lower leg: Edema present.  Lymphadenopathy:     Cervical: No cervical adenopathy.  Skin:    Findings: Erythema present. No rash.  Neurological:     Cranial Nerves: No cranial nerve deficit.     Motor: No abnormal muscle tone.     Coordination: Coordination abnormal.     Gait: Gait abnormal.     Deep Tendon Reflexes: Reflexes normal.  Psychiatric:        Behavior: Behavior normal.        Thought Content: Thought content normal.        Judgment: Judgment normal.   skin breakdowns w/seepoing R leg 1 + B edema R>L In a w/c  Wounds  dressed, coban  Lab Results  Component Value Date   WBC 7.9 10/22/2019   HGB 11.9 (L) 10/22/2019   HCT 37.9 10/22/2019   PLT 214.0 10/22/2019   GLUCOSE 386 (H) 11/15/2019   CHOL 155 03/24/2018   TRIG 91  03/24/2018   HDL 64 03/24/2018   LDLDIRECT 89.3 06/13/2006   LDLCALC 73 03/24/2018   ALT 19 06/15/2019   AST 22 06/15/2019   NA 140 11/15/2019   K 4.7 11/15/2019   CL 98 11/15/2019   CREATININE 1.75 (H) 11/15/2019   BUN 42 (H) 11/15/2019   CO2 27 11/15/2019   TSH 1.645 12/19/2018   INR 2.5 12/05/2019   HGBA1C 9.0 (H) 10/02/2019   MICROALBUR 317.3 (H) 07/02/2016    DG Chest Portable 1 View  Result Date: 10/01/2019 CLINICAL DATA:  74 year old female with shortness of breath. EXAM: PORTABLE CHEST 1 VIEW COMPARISON:  Chest radiograph dated 12/22/2018. FINDINGS: There is stable cardiomegaly. Left atrial appendage occlusive device, median sternotomy wires, and mechanical aortic valve noted. No significant congestion. No focal consolidation, pleural effusion, pneumothorax. Linear and streaky density in the right upper lobe similar to prior CT and radiograph of 12/22/2018 likely represents scarring or post radiation changes. There is atherosclerotic calcification of the aorta. No acute osseous pathology. IMPRESSION: 1. No acute cardiopulmonary process. 2. Stable cardiomegaly. Electronically Signed   By: Anner Crete M.D.   On: 10/01/2019 20:21   ECHOCARDIOGRAM COMPLETE  Result Date: 10/02/2019    ECHOCARDIOGRAM REPORT   Patient Name:   [name] Date of Exam: 10/02/2019 Medical Rec #:  [id number]    Height:       60.0 in Accession #:    [id number]   Weight:       180.0 lb Date of Birth:  [birth year]    BSA:          1.785 m Patient Age:    52 years     BP:           179/99 mmHg Patient Gender: F            HR:           67 bpm. Exam Location:  Inpatient Procedure: 2D Echo, Cardiac Doppler and Color Doppler Indications:    CHF-Acute Diastolic 301.60  History:        Patient has prior  history of Echocardiogram examinations, most                 recent 12/20/2018. Previous Myocardial Infarction, COPD, Aortic                 Valve Disease, Arrythmias:Atrial Fibrillation; Risk                 Factors:Hypertension, Diabetes, Dyslipidemia and Former Smoker.                 GERD.                 Aortic Valve: 23 mm Edwards bovine valve is present in the                 aortic position. Procedure Date: 04/12/2013.  Sonographer:    Vickie Epley RDCS Referring Phys: [place]  1. Left ventricular ejection fraction, by estimation, is 60 to 65%. The left ventricle has low normal function. The left ventricle has no regional wall motion abnormalities. There is mild left ventricular hypertrophy. Left ventricular diastolic function  could not be evaluated. Left ventricular diastolic function could not be evaluated. Elevated left ventricular end-diastolic pressure.  2. Right ventricular systolic function is normal. The right ventricular size is normal. There is severely elevated pulmonary artery systolic pressure. The estimated right ventricular systolic pressure is 10.9 mmHg.  3. Left  atrial size was moderately dilated.  4. Right atrial size was mildly dilated.  5. The mitral valve is abnormal. Mild to moderate mitral valve regurgitation.  6. Tricuspid valve regurgitation is mild to moderate.  7. The aortic valve has been repaired/replaced. Aortic valve regurgitation is trivial. There is a 23 mm Edwards bovine valve present in the aortic position. Procedure Date: 04/12/2013. Aortic valve area, by VTI measures 1.66 cm. Aortic valve mean gradient measures 13.0 mmHg. Aortic valve Vmax measures 2.72 m/s.  8. The inferior vena cava is dilated in size with <50% respiratory variability, suggesting right atrial pressure of 15 mmHg. Comparison(s): Changes from prior study are noted. 12/20/2018: LVEF 60-65%, RVSP 44 mmHg. FINDINGS  Left Ventricle: Left ventricular ejection fraction, by estimation, is  60 to 65%. The left ventricle has low normal function. The left ventricle has no regional wall motion abnormalities. The left ventricular internal cavity size was normal in size. There is mild left ventricular hypertrophy. Left ventricular diastolic function could not be evaluated due to atrial fibrillation. Left ventricular diastolic function could not be evaluated. Elevated left ventricular end-diastolic pressure. Right Ventricle: The right ventricular size is normal. No increase in right ventricular wall thickness. Right ventricular systolic function is normal. There is severely elevated pulmonary artery systolic pressure. The tricuspid regurgitant velocity is 4.12 m/s, and with an assumed right atrial pressure of 15 mmHg, the estimated right ventricular systolic pressure is 62.7 mmHg. Left Atrium: Left atrial size was moderately dilated. Right Atrium: Right atrial size was mildly dilated. Pericardium: There is no evidence of pericardial effusion. Mitral Valve: The mitral valve is abnormal. There is mild thickening of the mitral valve leaflet(s). There is mild calcification of the mitral valve leaflet(s). Moderate mitral annular calcification. Mild to moderate mitral valve regurgitation. Tricuspid Valve: The tricuspid valve is grossly normal. Tricuspid valve regurgitation is mild to moderate. Aortic Valve: The aortic valve has been repaired/replaced. Aortic valve regurgitation is trivial. Aortic valve mean gradient measures 13.0 mmHg. Aortic valve peak gradient measures 29.7 mmHg. Aortic valve area, by VTI measures 1.66 cm. There is a 23 mm Edwards bovine valve present in the aortic position. Procedure Date: 04/12/2013. Pulmonic Valve: The pulmonic valve was normal in structure. Pulmonic valve regurgitation is trivial. Aorta: The aortic root, ascending aorta, aortic arch and descending aorta are all structurally normal, with no evidence of dilitation or obstruction. Venous: The inferior vena cava is dilated in  size with less than 50% respiratory variability, suggesting right atrial pressure of 15 mmHg. IAS/Shunts: No atrial level shunt detected by color flow Doppler.  LEFT VENTRICLE PLAX 2D LVIDd:         5.28 cm LVIDs:         4.19 cm LV PW:         1.03 cm LV IVS:        1.02 cm LVOT diam:     2.20 cm LV SV:         82 LV SV Index:   46 LVOT Area:     3.80 cm  LV Volumes (MOD) LV vol d, MOD A2C: 145.0 ml LV vol d, MOD A4C: 181.0 ml LV vol s, MOD A2C: 75.6 ml LV vol s, MOD A4C: 83.7 ml LV SV MOD A2C:     69.4 ml LV SV MOD A4C:     181.0 ml LV SV MOD BP:      90.9 ml RIGHT VENTRICLE TAPSE (M-mode): 1.2 cm LEFT ATRIUM  Index       RIGHT ATRIUM           Index LA diam:        5.00 cm 2.80 cm/m  RA Area:     20.90 cm LA Vol (A2C):   63.6 ml 35.63 ml/m RA Volume:   66.00 ml  36.98 ml/m LA Vol (A4C):   79.0 ml 44.26 ml/m LA Biplane Vol: 71.2 ml 39.89 ml/m  AORTIC VALVE AV Area (Vmax):    1.41 cm AV Area (Vmean):   1.34 cm AV Area (VTI):     1.66 cm AV Vmax:           272.50 cm/s AV Vmean:          156.000 cm/s AV VTI:            0.490 m AV Peak Grad:      29.7 mmHg AV Mean Grad:      13.0 mmHg LVOT Vmax:         101.00 cm/s LVOT Vmean:        55.100 cm/s LVOT VTI:          0.215 m LVOT/AV VTI ratio: 0.44  AORTA Ao Root diam: 2.70 cm TRICUSPID VALVE TR Peak grad:   67.9 mmHg TR Vmax:        412.00 cm/s  SHUNTS Systemic VTI:  0.21 m Systemic Diam: 2.20 cm Lyman Bishop MD Electronically signed by Lyman Bishop MD Signature Date/Time: 10/02/2019/11:17:44 AM    Final     Assessment & Plan:   There are no diagnoses linked to this encounter.   No orders of the defined types were placed in this encounter.    Follow-up: No follow-ups on file.  Walker Kehr, MD

## 2019-12-05 NOTE — Patient Instructions (Signed)
Description   Increase dose to 5mg  daily except and 2.5mg  on Mondays. Repeat INR in 1 week * Patient taking Glucerna/Ensure every day; started about 1 week ago* Dose discussed with [place]

## 2019-12-05 NOTE — Patient Instructions (Signed)
Increase insulin to 36-38 or 40 units a day

## 2019-12-05 NOTE — Patient Outreach (Signed)
[facility]) Care Management  12/05/2019  [name] 1946-05-01 [id number]    Telephone assessment:  Placed call to patient who answered and identified herself. Patient reports weight unchanged by notes some swelling in legs.  Reports legs are oozing and are painful. She thinks they are still infected.  Reports CBG of 407 today and she took her 34 units of insulin. Reports CBG yesterday in the mid 200's.  Confirmed taking her 34 units yesterday.   PLAN: reviewed my concern with patient about her painful swollen legs and increased CBG.  Offered to make a primary MD care MD appointment today and she agreed.  Placed call to primary care office and secured and office visit with primary MD for 240 today.  Spoke with patient again to inform and she has confirmed that husband can drive her.   Will follow up in 1 week.   Tomasa Rand, RN, BSN, CEN [facility] [employer] [phone]

## 2019-12-05 NOTE — Assessment & Plan Note (Addendum)
Worse See CHF Triamc cream for leaky parts of the legs/dermatitis

## 2019-12-05 NOTE — Assessment & Plan Note (Signed)
Increase insulin to 36-38 or 40 units a day

## 2019-12-06 DIAGNOSIS — R600 Localized edema: Secondary | ICD-10-CM | POA: Diagnosis not present

## 2019-12-06 DIAGNOSIS — D849 Immunodeficiency, unspecified: Secondary | ICD-10-CM | POA: Diagnosis not present

## 2019-12-06 DIAGNOSIS — E119 Type 2 diabetes mellitus without complications: Secondary | ICD-10-CM | POA: Diagnosis not present

## 2019-12-06 DIAGNOSIS — E1122 Type 2 diabetes mellitus with diabetic chronic kidney disease: Secondary | ICD-10-CM | POA: Diagnosis not present

## 2019-12-06 DIAGNOSIS — I2510 Atherosclerotic heart disease of native coronary artery without angina pectoris: Secondary | ICD-10-CM | POA: Diagnosis not present

## 2019-12-06 DIAGNOSIS — I152 Hypertension secondary to endocrine disorders: Secondary | ICD-10-CM | POA: Diagnosis not present

## 2019-12-06 DIAGNOSIS — I10 Essential (primary) hypertension: Secondary | ICD-10-CM | POA: Diagnosis not present

## 2019-12-06 DIAGNOSIS — I5033 Acute on chronic diastolic (congestive) heart failure: Secondary | ICD-10-CM | POA: Diagnosis not present

## 2019-12-06 DIAGNOSIS — M1030 Gout due to renal impairment, unspecified site: Secondary | ICD-10-CM | POA: Diagnosis not present

## 2019-12-06 DIAGNOSIS — Z940 Kidney transplant status: Secondary | ICD-10-CM | POA: Diagnosis not present

## 2019-12-06 DIAGNOSIS — N186 End stage renal disease: Secondary | ICD-10-CM | POA: Diagnosis not present

## 2019-12-06 DIAGNOSIS — R809 Proteinuria, unspecified: Secondary | ICD-10-CM | POA: Diagnosis not present

## 2019-12-11 ENCOUNTER — Inpatient Hospital Stay: Payer: Medicare Other | Admitting: Internal Medicine

## 2019-12-12 ENCOUNTER — Telehealth: Payer: Self-pay | Admitting: Internal Medicine

## 2019-12-12 ENCOUNTER — Ambulatory Visit: Payer: Medicare Other | Admitting: Internal Medicine

## 2019-12-12 ENCOUNTER — Telehealth: Payer: Self-pay | Admitting: Cardiology

## 2019-12-12 ENCOUNTER — Ambulatory Visit (INDEPENDENT_AMBULATORY_CARE_PROVIDER_SITE_OTHER): Payer: Medicare Other | Admitting: Cardiovascular Disease

## 2019-12-12 ENCOUNTER — Other Ambulatory Visit: Payer: Self-pay

## 2019-12-12 ENCOUNTER — Other Ambulatory Visit: Payer: Self-pay | Admitting: General Practice

## 2019-12-12 DIAGNOSIS — I480 Paroxysmal atrial fibrillation: Secondary | ICD-10-CM

## 2019-12-12 DIAGNOSIS — Z5181 Encounter for therapeutic drug level monitoring: Secondary | ICD-10-CM

## 2019-12-12 DIAGNOSIS — I2510 Atherosclerotic heart disease of native coronary artery without angina pectoris: Secondary | ICD-10-CM | POA: Diagnosis not present

## 2019-12-12 DIAGNOSIS — N186 End stage renal disease: Secondary | ICD-10-CM | POA: Diagnosis not present

## 2019-12-12 DIAGNOSIS — I5033 Acute on chronic diastolic (congestive) heart failure: Secondary | ICD-10-CM | POA: Diagnosis not present

## 2019-12-12 DIAGNOSIS — M1030 Gout due to renal impairment, unspecified site: Secondary | ICD-10-CM | POA: Diagnosis not present

## 2019-12-12 DIAGNOSIS — I152 Hypertension secondary to endocrine disorders: Secondary | ICD-10-CM | POA: Diagnosis not present

## 2019-12-12 DIAGNOSIS — E1122 Type 2 diabetes mellitus with diabetic chronic kidney disease: Secondary | ICD-10-CM | POA: Diagnosis not present

## 2019-12-12 LAB — POCT INR: INR: 2.6 (ref 2.0–3.0)

## 2019-12-12 NOTE — Telephone Encounter (Signed)
Scheduled appt per 5/25 sch message - unable to reach pt . Left message with appt date and time.

## 2019-12-12 NOTE — Telephone Encounter (Signed)
Will route to PharmD. Thanks!

## 2019-12-12 NOTE — Telephone Encounter (Signed)
INR addressed in separate anticoag encounter from today.

## 2019-12-12 NOTE — Telephone Encounter (Signed)
   [facility] health nurse, she said she visited pt and took INR. And result is 2.6  Please advise

## 2019-12-12 NOTE — Patient Instructions (Signed)
Description   Continue taking 5mg  daily except 2.5mg  on Mondays. Repeat INR in 2 weeks * Patient taking Glucerna/Ensure every day; started about 1 week ago* Dose discussed with and order given to [place]

## 2019-12-12 NOTE — Patient Outreach (Signed)
[facility]) Care Management  12/12/2019  [name] 22-Dec-1945 [id number]   Telephone assessment:  Placed follow up call to patient with no answer on home or mobile number. Reviewed MD visit.  PLAN: will call back in 3 days.  Tomasa Rand, RN, BSN, CEN [facility] [employer] [phone]

## 2019-12-14 ENCOUNTER — Ambulatory Visit (HOSPITAL_COMMUNITY)
Admission: RE | Admit: 2019-12-14 | Discharge: 2019-12-14 | Disposition: A | Discharge status: Home / Self Care | Payer: Medicare Other | Source: Ambulatory Visit | Attending: Internal Medicine | Admitting: Internal Medicine

## 2019-12-14 ENCOUNTER — Other Ambulatory Visit: Payer: Self-pay

## 2019-12-14 ENCOUNTER — Inpatient Hospital Stay: Payer: Medicare Other | Attending: Internal Medicine

## 2019-12-14 ENCOUNTER — Encounter (HOSPITAL_COMMUNITY): Payer: Self-pay

## 2019-12-14 DIAGNOSIS — C3490 Malignant neoplasm of unspecified part of unspecified bronchus or lung: Secondary | ICD-10-CM

## 2019-12-14 DIAGNOSIS — C7801 Secondary malignant neoplasm of right lung: Secondary | ICD-10-CM | POA: Insufficient documentation

## 2019-12-14 DIAGNOSIS — C3411 Malignant neoplasm of upper lobe, right bronchus or lung: Secondary | ICD-10-CM | POA: Diagnosis not present

## 2019-12-14 LAB — CBC WITH DIFFERENTIAL (CANCER CENTER ONLY)
Abs Immature Granulocytes: 0.03 10*3/uL (ref 0.00–0.07)
Basophils Absolute: 0 10*3/uL (ref 0.0–0.1)
Basophils Relative: 1 %
Eosinophils Absolute: 0.1 10*3/uL (ref 0.0–0.5)
Eosinophils Relative: 2 %
HCT: 39.3 % (ref 36.0–46.0)
Hemoglobin: 12 g/dL (ref 12.0–15.0)
Immature Granulocytes: 1 %
Lymphocytes Relative: 16 %
Lymphs Abs: 1 10*3/uL (ref 0.7–4.0)
MCH: 24.3 pg — ABNORMAL LOW (ref 26.0–34.0)
MCHC: 30.5 g/dL (ref 30.0–36.0)
MCV: 79.6 fL — ABNORMAL LOW (ref 80.0–100.0)
Monocytes Absolute: 0.8 10*3/uL (ref 0.1–1.0)
Monocytes Relative: 13 %
Neutro Abs: 4.4 10*3/uL (ref 1.7–7.7)
Neutrophils Relative %: 67 %
Platelet Count: 199 10*3/uL (ref 150–400)
RBC: 4.94 MIL/uL (ref 3.87–5.11)
RDW: 17.8 % — ABNORMAL HIGH (ref 11.5–15.5)
WBC Count: 6.3 10*3/uL (ref 4.0–10.5)
nRBC: 0 % (ref 0.0–0.2)

## 2019-12-14 LAB — CMP (CANCER CENTER ONLY)
ALT: 23 U/L (ref 0–44)
AST: 25 U/L (ref 15–41)
Albumin: 2.7 g/dL — ABNORMAL LOW (ref 3.5–5.0)
Alkaline Phosphatase: 121 U/L (ref 38–126)
Anion gap: 10 (ref 5–15)
BUN: 60 mg/dL — ABNORMAL HIGH (ref 8–23)
CO2: 31 mmol/L (ref 22–32)
Calcium: 10.1 mg/dL (ref 8.9–10.3)
Chloride: 98 mmol/L (ref 98–111)
Creatinine: 2.01 mg/dL — ABNORMAL HIGH (ref 0.44–1.00)
GFR, Est AFR Am: 27 mL/min — ABNORMAL LOW (ref >60)
GFR, Estimated: 24 mL/min — ABNORMAL LOW (ref >60)
Glucose, Bld: 165 mg/dL — ABNORMAL HIGH (ref 70–99)
Potassium: 3.3 mmol/L — ABNORMAL LOW (ref 3.5–5.1)
Sodium: 139 mmol/L (ref 135–145)
Total Bilirubin: 0.5 mg/dL (ref 0.3–1.2)
Total Protein: 6.3 g/dL — ABNORMAL LOW (ref 6.5–8.1)

## 2019-12-15 ENCOUNTER — Other Ambulatory Visit: Payer: Self-pay | Admitting: Family

## 2019-12-17 DIAGNOSIS — K2100 Gastro-esophageal reflux disease with esophagitis, without bleeding: Secondary | ICD-10-CM | POA: Diagnosis not present

## 2019-12-17 DIAGNOSIS — K5730 Diverticulosis of large intestine without perforation or abscess without bleeding: Secondary | ICD-10-CM | POA: Diagnosis not present

## 2019-12-17 DIAGNOSIS — E1122 Type 2 diabetes mellitus with diabetic chronic kidney disease: Secondary | ICD-10-CM | POA: Diagnosis not present

## 2019-12-17 DIAGNOSIS — Z940 Kidney transplant status: Secondary | ICD-10-CM | POA: Diagnosis not present

## 2019-12-17 DIAGNOSIS — G4730 Sleep apnea, unspecified: Secondary | ICD-10-CM | POA: Diagnosis not present

## 2019-12-17 DIAGNOSIS — N186 End stage renal disease: Secondary | ICD-10-CM | POA: Diagnosis not present

## 2019-12-17 DIAGNOSIS — I4819 Other persistent atrial fibrillation: Secondary | ICD-10-CM | POA: Diagnosis not present

## 2019-12-17 DIAGNOSIS — I152 Hypertension secondary to endocrine disorders: Secondary | ICD-10-CM | POA: Diagnosis not present

## 2019-12-17 DIAGNOSIS — Z7901 Long term (current) use of anticoagulants: Secondary | ICD-10-CM | POA: Diagnosis not present

## 2019-12-17 DIAGNOSIS — Z992 Dependence on renal dialysis: Secondary | ICD-10-CM | POA: Diagnosis not present

## 2019-12-17 DIAGNOSIS — Z794 Long term (current) use of insulin: Secondary | ICD-10-CM | POA: Diagnosis not present

## 2019-12-17 DIAGNOSIS — Z87891 Personal history of nicotine dependence: Secondary | ICD-10-CM | POA: Diagnosis not present

## 2019-12-17 DIAGNOSIS — Z87440 Personal history of urinary (tract) infections: Secondary | ICD-10-CM | POA: Diagnosis not present

## 2019-12-17 DIAGNOSIS — D509 Iron deficiency anemia, unspecified: Secondary | ICD-10-CM | POA: Diagnosis not present

## 2019-12-17 DIAGNOSIS — H9190 Unspecified hearing loss, unspecified ear: Secondary | ICD-10-CM | POA: Diagnosis not present

## 2019-12-17 DIAGNOSIS — I2510 Atherosclerotic heart disease of native coronary artery without angina pectoris: Secondary | ICD-10-CM | POA: Diagnosis not present

## 2019-12-17 DIAGNOSIS — K8020 Calculus of gallbladder without cholecystitis without obstruction: Secondary | ICD-10-CM | POA: Diagnosis not present

## 2019-12-17 DIAGNOSIS — C3411 Malignant neoplasm of upper lobe, right bronchus or lung: Secondary | ICD-10-CM | POA: Diagnosis not present

## 2019-12-17 DIAGNOSIS — J449 Chronic obstructive pulmonary disease, unspecified: Secondary | ICD-10-CM | POA: Diagnosis not present

## 2019-12-17 DIAGNOSIS — J9611 Chronic respiratory failure with hypoxia: Secondary | ICD-10-CM | POA: Diagnosis not present

## 2019-12-17 DIAGNOSIS — M1030 Gout due to renal impairment, unspecified site: Secondary | ICD-10-CM | POA: Diagnosis not present

## 2019-12-17 DIAGNOSIS — E785 Hyperlipidemia, unspecified: Secondary | ICD-10-CM | POA: Diagnosis not present

## 2019-12-17 DIAGNOSIS — Z952 Presence of prosthetic heart valve: Secondary | ICD-10-CM | POA: Diagnosis not present

## 2019-12-17 DIAGNOSIS — I5033 Acute on chronic diastolic (congestive) heart failure: Secondary | ICD-10-CM | POA: Diagnosis not present

## 2019-12-18 ENCOUNTER — Other Ambulatory Visit: Payer: Self-pay

## 2019-12-18 NOTE — Patient Outreach (Addendum)
[facility]) Care Management  12/18/2019  [name] 1946-07-02 [id number]   Telephone assessment:  Placed call to patient with no answer. Left a message requesting a call back.  PLAN:if no call back will reattempt.   12:00 Update: Patient returned call and states she is doing ok. Reports CBG 190- 300.  Reports she continues to take her medications as prescribed.  Reports cellulitis is gone. Reports decrease pain.  Reviewed with patient latest recommendation for how to take her insulin.    Reports weight is unchanged.   Tomasa Rand, RN, BSN, CEN [facility] [employer] [phone]

## 2019-12-19 ENCOUNTER — Other Ambulatory Visit: Payer: Self-pay

## 2019-12-19 ENCOUNTER — Inpatient Hospital Stay: Payer: Medicare Other | Attending: Internal Medicine | Admitting: Internal Medicine

## 2019-12-19 ENCOUNTER — Telehealth: Payer: Self-pay | Admitting: Internal Medicine

## 2019-12-19 ENCOUNTER — Encounter: Payer: Self-pay | Admitting: Internal Medicine

## 2019-12-19 DIAGNOSIS — Z9981 Dependence on supplemental oxygen: Secondary | ICD-10-CM | POA: Diagnosis not present

## 2019-12-19 DIAGNOSIS — I252 Old myocardial infarction: Secondary | ICD-10-CM | POA: Insufficient documentation

## 2019-12-19 DIAGNOSIS — I132 Hypertensive heart and chronic kidney disease with heart failure and with stage 5 chronic kidney disease, or end stage renal disease: Secondary | ICD-10-CM | POA: Insufficient documentation

## 2019-12-19 DIAGNOSIS — Z953 Presence of xenogenic heart valve: Secondary | ICD-10-CM | POA: Insufficient documentation

## 2019-12-19 DIAGNOSIS — I509 Heart failure, unspecified: Secondary | ICD-10-CM | POA: Diagnosis not present

## 2019-12-19 DIAGNOSIS — I2510 Atherosclerotic heart disease of native coronary artery without angina pectoris: Secondary | ICD-10-CM

## 2019-12-19 DIAGNOSIS — N186 End stage renal disease: Secondary | ICD-10-CM | POA: Diagnosis not present

## 2019-12-19 DIAGNOSIS — Z79899 Other long term (current) drug therapy: Secondary | ICD-10-CM | POA: Diagnosis not present

## 2019-12-19 DIAGNOSIS — E1122 Type 2 diabetes mellitus with diabetic chronic kidney disease: Secondary | ICD-10-CM | POA: Insufficient documentation

## 2019-12-19 DIAGNOSIS — I10 Essential (primary) hypertension: Secondary | ICD-10-CM | POA: Diagnosis not present

## 2019-12-19 DIAGNOSIS — G4730 Sleep apnea, unspecified: Secondary | ICD-10-CM | POA: Insufficient documentation

## 2019-12-19 DIAGNOSIS — R600 Localized edema: Secondary | ICD-10-CM | POA: Diagnosis not present

## 2019-12-19 DIAGNOSIS — C3411 Malignant neoplasm of upper lobe, right bronchus or lung: Secondary | ICD-10-CM | POA: Diagnosis present

## 2019-12-19 DIAGNOSIS — Z23 Encounter for immunization: Secondary | ICD-10-CM | POA: Diagnosis not present

## 2019-12-19 DIAGNOSIS — E119 Type 2 diabetes mellitus without complications: Secondary | ICD-10-CM | POA: Diagnosis not present

## 2019-12-19 DIAGNOSIS — Z940 Kidney transplant status: Secondary | ICD-10-CM | POA: Diagnosis not present

## 2019-12-19 DIAGNOSIS — C3490 Malignant neoplasm of unspecified part of unspecified bronchus or lung: Secondary | ICD-10-CM | POA: Diagnosis not present

## 2019-12-19 DIAGNOSIS — D849 Immunodeficiency, unspecified: Secondary | ICD-10-CM | POA: Diagnosis not present

## 2019-12-19 DIAGNOSIS — R809 Proteinuria, unspecified: Secondary | ICD-10-CM | POA: Diagnosis not present

## 2019-12-19 NOTE — Progress Notes (Signed)
[place] [phone]   [phone]  OFFICE PROGRESS NOTE  Plotnikov, [name], MD [place] [address]  DIAGNOSIS: Stage IA (T1b, N0, M0) non-small cell lung cancer, well-differentiated adenocarcinoma with biopsy-proven right upper lobe pulmonary nodule diagnosed in May 2019.  The patient also has 2 other suspicious nodule in the right upper lobe and left lower lobe but they are too small to characterize at this point and they could represent multifocal disease.  PRIOR THERAPY: Curative stereotactic radiotherapy to the right upper lobe pulmonary nodule under the care of Dr. Sondra Come completed on December 29, 2017.  CURRENT THERAPY: Observation.  INTERVAL HISTORY: [name] 74 y.o. female returns to the clinic today for follow-up visit.  The patient is feeling fine today with no concerning complaints except for the baseline shortness of breath and she is currently on home oxygen. She was treated recently for cellulitis of the lower extremities by her primary care physician.  She is also followed by nephrology for her chronic kidney disease.  The patient denied having any chest pain, cough or hemoptysis.  She denied having any fever or chills.  She has no nausea, vomiting, diarrhea or constipation.  She has no headache or visual changes.  She is here today for evaluation with repeat CT scan of the chest for restaging of her disease.    MEDICAL HISTORY: Past Medical History:  Diagnosis Date   Acute CHF (congestive heart failure) ([place]) 12/2018   Adenocarcinoma of lung, stage 1, right ([place]) 11/25/2017   Anemia, iron deficiency    of chronic disease   Aortic stenosis    a. Severe AS by echo 11/2012.   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 During ENT surgery 2005: developed marked asystole requiring CPR, felt due to vagal reaction (cath nonobst dz).   Carotid artery disease ([place])    a.  Carotid Dopplers performed in August 2013 showed 40-59% left stenosis and 0-39% right; f/u recommended in 2 years.    Cerebrovascular accident [facility]) 2009   a. LMCA infarct felt embolic 3419, maintained on chronic coumadin.; denies residual on 04/05/2013   Cholelithiasis    Chronic Persistent Atrial Fibrillation 12/31/2008   Qualifier: Diagnosis of  By: [name]     Coronary artery disease 05/2002   a. Ant MI 2003 s/p PTCA/stent to RCA.    Diverticulosis of colon    Esophagitis, reflux    ESRD (end stage renal disease) ([place])    a. Mass on L kidney per pt s/p nephrectomy - pt states not cancer - [facility] notes indicate ESRD due to HTN/DM - was previously on HD. b. Kidney transplant 02/2011.   GERD (gastroesophageal reflux disease)    Gout    Hearing loss    Helicobacter pylori (H. pylori) infection    hx of   Hemorrhoids    Hx of colonic polyps    adenomatous   Hyperlipidemia    Hypertension    Lung nodule seen on imaging study 04/07/2013   1.0 cm ground glass opacity RUL   Myocardial infarction [facility]) 2003   Pericardial effusion    a. Small by echo 11/2011.   S/P aortic valve replacement with bioprosthetic valve and maze procedure 04/12/2013   78mm [facility] Ease bovine pericardial tissue valve    S/P Maze operation for atrial fibrillation 04/12/2013   Complete bilateral atrial lesion set using bipolar  radiofrequency and cryothermy ablation with clipping of LA appendage   Sleep apnea    Pt says testing was positive, intolerant of CPAP.   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   Stroke [facility])    Type II diabetes mellitus (HCC)     ALLERGIES:  is allergic to ibuprofen; sulfamethoxazole-trimethoprim; sulfonamide derivatives; tape; tramadol; doxycycline; hydrocil [psyllium]; bactrim; and red  dye.  MEDICATIONS:  Current Outpatient Medications  Medication Sig Dispense Refill   acetaminophen (TYLENOL) 500 MG tablet Take 500 mg by mouth every 6 (six) hours as needed for mild pain or headache.     amoxicillin (AMOXIL) 500 MG capsule TAKE 4 CAPSULES BY MOUTH 1 HOUR PRIOR TO DENTAL WORK     cephALEXin (KEFLEX) 500 MG capsule Take 1 capsule (500 mg total) by mouth 4 (four) times daily. 40 capsule 0   dapsone 25 MG tablet Take 25 mg by mouth daily.      gabapentin (NEURONTIN) 300 MG capsule Take 1 capsule (300 mg total) by mouth 3 (three) times daily. 90 capsule 5   insulin glargine, 1 Unit Dial, (TOUJEO SOLOSTAR) 300 UNIT/ML Solostar Pen Inject 34 Units into the skin every morning. Titrate up by 1 unit a day if needed for goal sugars of 100-130 up to 30 units a day max 3 pen 11   Insulin Pen Needle (B-D UF III MINI PEN NEEDLES) 31G X 5 MM MISC USE TO ADMINISTER INSULIN FOUR TIMES A DAY DX E11.9 100 each 1   levothyroxine (SYNTHROID) 25 MCG tablet TAKE 2 TABLETS (50 MCG TOTAL) BY MOUTH DAILY. (Patient taking differently: Take 50 mcg by mouth daily before breakfast. ) 180 tablet 3   metoprolol tartrate (LOPRESSOR) 25 MG tablet Take 0.5 tablets (12.5 mg total) by mouth 2 (two) times daily. 90 tablet 3   ONETOUCH DELICA LANCETS 96E MISC Use to check blood sugars three times a day DX E11.9 100 each 5   ONETOUCH VERIO test strip USE TO TEST 3 TIMES DAILY. DX E11.9 100 strip 5   oxyCODONE-acetaminophen (PERCOCET/ROXICET) 5-325 MG tablet Take 1 tablet by mouth every 8 (eight) hours as needed for severe pain. 60 tablet 0   OXYGEN Inhale 3 L into the lungs as needed (for shortness of breath).      potassium chloride SA (KLOR-CON M20) 20 MEQ tablet Take 1 tablet (20 mEq total) by mouth daily. (Patient taking differently: Take 40 mEq by mouth daily. ) 30 tablet 3   predniSONE (DELTASONE) 5 MG tablet Take 5 mg by mouth daily.   5   Propylene Glycol (SYSTANE BALANCE) 0.6 % SOLN Place 1-2  drops into both eyes 3 (three) times daily as needed (for dryness).     repaglinide (PRANDIN) 0.5 MG tablet Take 0.5 mg by mouth 3 (three) times daily before meals.     rosuvastatin (CRESTOR) 20 MG tablet Take 1 tablet (20 mg total) by mouth daily. (Patient taking differently: Take 20 mg by mouth every other day. ) 90 tablet 3   tacrolimus (PROGRAF) 1 MG capsule Take 3 mg by mouth 2 (two) times daily.      torsemide (DEMADEX) 100 MG tablet Take 1 tablet (100 mg total) by mouth daily. 30 tablet 5   triamcinolone cream (KENALOG) 0.1 % Apply 1 application topically 4 (four) times daily. 80 g 1   warfarin (COUMADIN) 5 MG  tablet TAKE 1/2 TO 1 TABLET BY MOUTH DAILY AS DIRECTED BY COUMADIN CLINIC 90 tablet 1   No current facility-administered medications for this visit.    SURGICAL HISTORY:  Past Surgical History:  Procedure Laterality Date   AORTIC VALVE REPLACEMENT N/A 04/12/2013   Procedure: AORTIC VALVE REPLACEMENT (AVR);  Surgeon: Rexene Alberts, MD;  Location: [facility];  Service: Open Heart Surgery;  Laterality: N/A;   ARTERIOVENOUS GRAFT PLACEMENT Left    ARTERIOVENOUS GRAFT PLACEMENT Left    "I've had 2 on my left; had one removed" (04/05/2013)    ARTERY EXPLORATION Right 04/11/2013   Procedure: ARTERY EXPLORATION;  Surgeon: Rexene Alberts, MD;  Location: [facility];  Service: Open Heart Surgery;  Laterality: Right;  Right carotid artery exploration   AV FISTULA PLACEMENT Right    AV FISTULA REPAIR Right    "took it out" ((/18/2014)   CARDIOVERSION  05/29/2012   Procedure: CARDIOVERSION;  Surgeon: Lelon Perla, MD;  Location: [facility] ENDOSCOPY;  Service: Cardiovascular;  Laterality: N/A;   CHOLECYSTECTOMY  2009   with hernia removal   CORONARY ANGIOPLASTY WITH STENT PLACEMENT Right    coronary artery   INSERTION OF DIALYSIS CATHETER Bilateral    "over the years; took them both out" (04/05/2013)   INTRAOPERATIVE TRANSESOPHAGEAL ECHOCARDIOGRAM N/A 04/11/2013   Procedure:  INTRAOPERATIVE TRANSESOPHAGEAL ECHOCARDIOGRAM;  Surgeon: Rexene Alberts, MD;  Location: [facility];  Service: Open Heart Surgery;  Laterality: N/A;   INTRAOPERATIVE TRANSESOPHAGEAL ECHOCARDIOGRAM N/A 04/12/2013   Procedure: INTRAOPERATIVE TRANSESOPHAGEAL ECHOCARDIOGRAM;  Surgeon: Rexene Alberts, MD;  Location: [facility] [place];  Service: Open Heart Surgery;  Laterality: N/A;   KIDNEY TRANSPLANT  03/16/11   LEFT AND RIGHT HEART CATHETERIZATION WITH CORONARY ANGIOGRAM N/A 04/06/2013   Procedure: LEFT AND RIGHT HEART CATHETERIZATION WITH CORONARY ANGIOGRAM;  Surgeon: Blane Ohara, MD;  Location: [facility] CATH LAB;  Service: Cardiovascular;  Laterality: N/A;   MAZE N/A 04/12/2013   Procedure: MAZE;  Surgeon: Rexene Alberts, MD;  Location: [place];  Service: Open Heart Surgery;  Laterality: N/A;   NASAL RECONSTRUCTION WITH SEPTAL REPAIR     "took it out" (04/05/2013)   NEPHRECTOMY Left 2010   no CA on bx   TONSILLECTOMY     TOTAL ABDOMINAL HYSTERECTOMY     TUBAL LIGATION      REVIEW OF SYSTEMS:  A comprehensive review of systems was negative except for: Constitutional: positive for fatigue Respiratory: positive for dyspnea on exertion   PHYSICAL EXAMINATION: General appearance: alert, cooperative, fatigued and no distress Head: Normocephalic, without obvious abnormality, atraumatic Neck: no adenopathy, no JVD, supple, symmetrical, trachea midline and thyroid not enlarged, symmetric, no tenderness/mass/nodules Lymph nodes: Cervical, supraclavicular, and axillary nodes normal. Resp: clear to auscultation bilaterally Back: symmetric, no curvature. ROM normal. No CVA tenderness. Cardio: regular rate and rhythm, S1, S2 normal, no murmur, click, rub or gallop GI: soft, non-tender; bowel sounds normal; no masses,  no organomegaly Extremities: edema 1+ edema bilaterally  ECOG PERFORMANCE STATUS: 1 - Symptomatic but completely ambulatory  Blood pressure (!) 171/74, pulse 91, temperature 97.7 F (36.5 C),  temperature source Temporal, resp. rate 18, height 5' (1.524 m), weight 159 lb 4.8 oz (72.3 kg), SpO2 94 %.  LABORATORY DATA: Lab Results  Component Value Date   WBC 6.3 12/14/2019   HGB 12.0 12/14/2019   HCT 39.3 12/14/2019   MCV 79.6 (L) 12/14/2019   PLT 199 12/14/2019      Chemistry      Component  Value Date/Time   NA 139 12/14/2019 0918   NA 140 11/15/2019 1617   K 3.3 (L) 12/14/2019 0918   CL 98 12/14/2019 0918   CO2 31 12/14/2019 0918   BUN 60 (H) 12/14/2019 0918   BUN 42 (H) 11/15/2019 1617   CREATININE 2.01 (H) 12/14/2019 0918   CREATININE 1.35 (H) 03/30/2013 1634      Component Value Date/Time   CALCIUM 10.1 12/14/2019 0918   CALCIUM 9.7 01/29/2008 1338   ALKPHOS 121 12/14/2019 0918   AST 25 12/14/2019 0918   ALT 23 12/14/2019 0918   BILITOT 0.5 12/14/2019 0918       RADIOGRAPHIC STUDIES: CT Chest Wo Contrast  Result Date: 12/14/2019 CLINICAL DATA:  Primary Cancer Type: Lung Imaging Indication: Routine surveillance Interval therapy since last imaging? No Initial Cancer Diagnosis Date: 11/25/2017; Established by: Biopsy-proven Detailed Pathology: Stage IA non-small cell lung cancer, well-differentiated adenocarcinoma Primary Tumor location: Right upper lobe, left lower lobe Chemotherapy: No Immunotherapy? No Radiation therapy? Yes; Date Range: 12/22/2017-12/29/2017; Target: Right lung EXAM: CT CHEST WITHOUT CONTRAST TECHNIQUE: Multidetector CT imaging of the chest was performed following the standard protocol without IV contrast. COMPARISON:  Most recent CT chest 08/24/2019.  11/03/2017 PET-CT. FINDINGS: Cardiovascular: Moderate cardiomegaly, stable. 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 Three-vessel coronary atherosclerosis. No significant pericardial effusion/thickening. Atherosclerotic nonaneurysmal thoracic aorta. Stable dilated main pulmonary artery (3.5 cm diameter). Mediastinum/Nodes: Stable subcentimeter calcified anterior right thyroid nodule. Not clinically  significant; no follow-up imaging recommended (ref: J Am Coll Radiol. 2015 Feb;12(2): 143-50). Unremarkable esophagus. No pathologically enlarged axillary, mediastinal or hilar lymph nodes, noting limited sensitivity for the detection of hilar adenopathy on this noncontrast study. Lungs/Pleura: No pneumothorax. Trace dependent bilateral pleural effusions, similar. Patchy fairly sharply marginated consolidation in the apical right upper lobe with associated volume loss and distortion (series 7/image 32), not appreciably changed, compatible with postradiation change. Posterior right upper lobe 0.8 cm ground-glass pulmonary nodule (series 7/image 41), previously 0.8 cm using similar measurement technique, stable. Irregular thickened bandlike consolidation in posterior basilar right lower lobe (series 7/image 109) is unchanged, favoring postinfectious/postinflammatory scarring. Superior segment left lower lobe 0.8 cm solid pulmonary nodule (series 7/image 69), previously 0.7 cm on 08/24/2019 and 0.6 cm 06/15/2019, slightly increased. No acute consolidative airspace disease or new significant pulmonary nodules. Upper abdomen: Left nephrectomy. Atrophic right kidney. Partially visualized simple 2.0 cm posterior upper right renal cyst. Right adrenal 1.4 cm nodule with density 17 HU, stable on multiple prior CT studies, compatible with a benign adenoma. Musculoskeletal: No aggressive appearing focal osseous lesions. Intact sternotomy wires. Marked thoracic spondylosis. IMPRESSION: 1. Continued slow growth of superior segment left lower lobe 0.8 cm solid pulmonary nodule, suspicious for slow growing metachronous primary bronchogenic carcinoma versus contralateral metastasis. 2. Stable postradiation change in the apical right upper lobe. 3. Stable 0.8 cm ground-glass posterior right upper lobe pulmonary nodule. 4. No thoracic adenopathy. 5. Stable moderate cardiomegaly, three-vessel coronary atherosclerosis, dilated main  pulmonary artery and trace dependent bilateral pleural effusions. 6. Stable right adrenal adenoma. 7.  Aortic Atherosclerosis (ICD10-I70.0). Electronically Signed   By: Ilona Sorrel M.D.   On: 12/14/2019 10:36    ASSESSMENT AND PLAN: This is a very pleasant 74 years old African-American female with history of stage IA non-small cell lung cancer status post curative stereotactic radiotherapy to the right upper lobe lung nodule in June 2019. The patient has been on observation since that time. She had repeat CT scan of the chest performed recently.  I personally  and independently reviewed the scan images and discussed the results with the patient today. Her scan showed continuous slow growth of the superior segment left lower lobe solid pulmonary nodule consistent with a slowly growing metachronous primary bronchogenic carcinoma versus contralateral metastasis. I recommended for the patient to see Dr. Sondra Come for consideration of SBRT to this nodule. She may need repeat PET scan before the procedure but I will leave it up to Dr. Sondra Come. The patient will come back for follow-up visit in 6 months for evaluation with repeat CT scan of the chest for restaging of her disease. For the hypertension and kidney disease she will continue her routine follow-up visit and evaluation by her primary care physician and nephrologist. The patient was advised to call immediately if she has any concerning symptoms in the interval. The patient voices understanding of current disease status and treatment options and is in agreement with the current care plan.  All questions were answered. The patient knows to call the clinic with any problems, questions or concerns. We can certainly see the patient much sooner if necessary.  Disclaimer: This note was dictated with voice recognition software. Similar sounding words can inadvertently be transcribed and may not be corrected upon review.

## 2019-12-19 NOTE — Telephone Encounter (Signed)
Scheduled per 6/2 los. Printed avs and calendar for pt.

## 2019-12-20 DIAGNOSIS — I4819 Other persistent atrial fibrillation: Secondary | ICD-10-CM | POA: Diagnosis not present

## 2019-12-20 DIAGNOSIS — I5033 Acute on chronic diastolic (congestive) heart failure: Secondary | ICD-10-CM | POA: Diagnosis not present

## 2019-12-20 DIAGNOSIS — I2510 Atherosclerotic heart disease of native coronary artery without angina pectoris: Secondary | ICD-10-CM | POA: Diagnosis not present

## 2019-12-20 DIAGNOSIS — C3411 Malignant neoplasm of upper lobe, right bronchus or lung: Secondary | ICD-10-CM | POA: Diagnosis not present

## 2019-12-20 DIAGNOSIS — I152 Hypertension secondary to endocrine disorders: Secondary | ICD-10-CM | POA: Diagnosis not present

## 2019-12-20 DIAGNOSIS — E1122 Type 2 diabetes mellitus with diabetic chronic kidney disease: Secondary | ICD-10-CM | POA: Diagnosis not present

## 2019-12-24 ENCOUNTER — Ambulatory Visit: Payer: Medicare Other

## 2019-12-26 ENCOUNTER — Ambulatory Visit (INDEPENDENT_AMBULATORY_CARE_PROVIDER_SITE_OTHER): Payer: Medicare Other | Admitting: Pharmacist Clinician (PhC)/ Clinical Pharmacy Specialist

## 2019-12-26 DIAGNOSIS — C3411 Malignant neoplasm of upper lobe, right bronchus or lung: Secondary | ICD-10-CM | POA: Diagnosis not present

## 2019-12-26 DIAGNOSIS — I480 Paroxysmal atrial fibrillation: Secondary | ICD-10-CM

## 2019-12-26 DIAGNOSIS — I152 Hypertension secondary to endocrine disorders: Secondary | ICD-10-CM | POA: Diagnosis not present

## 2019-12-26 DIAGNOSIS — E1122 Type 2 diabetes mellitus with diabetic chronic kidney disease: Secondary | ICD-10-CM | POA: Diagnosis not present

## 2019-12-26 DIAGNOSIS — I5033 Acute on chronic diastolic (congestive) heart failure: Secondary | ICD-10-CM | POA: Diagnosis not present

## 2019-12-26 DIAGNOSIS — I2510 Atherosclerotic heart disease of native coronary artery without angina pectoris: Secondary | ICD-10-CM | POA: Diagnosis not present

## 2019-12-26 DIAGNOSIS — I4819 Other persistent atrial fibrillation: Secondary | ICD-10-CM | POA: Diagnosis not present

## 2019-12-26 DIAGNOSIS — Z5181 Encounter for therapeutic drug level monitoring: Secondary | ICD-10-CM

## 2019-12-26 LAB — POCT INR: INR: 4.8 — AB (ref 2.0–3.0)

## 2019-12-26 NOTE — Progress Notes (Signed)
Patient here today for a Reconsult with Dr. Sondra Come.       [place] [phone]) [phone]   [phone]) [phone]  OFFICE PROGRESS NOTE  Plotnikov, [name], MD [place] [address]  DIAGNOSIS: Stage IA (T1b, N0, M0) non-small cell lung cancer, well-differentiated adenocarcinoma with biopsy-proven right upper lobe pulmonary nodule diagnosed in May 2019. The patient also has 2 other suspicious nodule in the right upper lobe and left lower lobe but they are too small to characterize at this point and they could represent multifocal disease.  PRIOR THERAPY: Curative stereotactic radiotherapy to the right upper lobe pulmonary nodule under the care of Dr. Sondra Come completed on December 29, 2017.  CURRENT THERAPY: Observation.  INTERVAL HISTORY: [name] 74 y.o. female returns to the clinic today for follow-up visit.  The patient is feeling fine today with no concerning complaints except for the baseline shortness of breath and she is currently on home oxygen. She was treated recently for cellulitis of the lower extremities by her primary care physician.  She is also followed by nephrology for her chronic kidney disease.  The patient denied having any chest pain, cough or hemoptysis.  She denied having any fever or chills.  She has no nausea, vomiting, diarrhea or constipation.  She has no headache or visual changes.  She is here today for evaluation with repeat CT scan of the chest for restaging of her disease.  ASSESSMENT AND PLAN: This is a very pleasant 74 years old African-American female with history of stage IA non-small cell lung cancer status post curative stereotactic radiotherapy to the right upper lobe lung nodule in June 2019. The patient has been on observation since that time. She had repeat CT scan of the chest performed recently.  I personally and independently reviewed the scan images and discussed the results with the patient today. Her  scan showed continuous slow growth of the superior segment left lower lobe solid pulmonary nodule consistent with a slowly growing metachronous primary bronchogenic carcinoma versus contralateral metastasis. I recommended for the patient to see Dr. Sondra Come for consideration of SBRT to this nodule. She may need repeat PET scan before the procedure but I will leave it up to Dr. Sondra Come. The patient will come back for follow-up visit in 6 months for evaluation with repeat CT scan of the chest for restaging of her disease. For the hypertension and kidney disease she will continue her routine follow-up visit and evaluation by her primary care physician and nephrologist. The patient was advised to call immediately if she has any concerning symptoms in the interval. The patient voices understanding of current disease status and treatment options and is in agreement with the current care plan.  All questions were answered. The patient knows to call the clinic with any problems, questions or concerns. We can certainly see the patient much sooner if necessary.  Disclaimer: This note was dictated with voice recognition software. Similar sounding words can inadvertently be transcribed and may not be corrected upon review.   Electronically signed by Curt Bears, MD at 12/19/2019 10:20 AM   Tobacco/Marijuana/Snuff/ETOH use: past hx  Past/Anticipated interventions by cardiothoracic surgery, if any: no  Past/Anticipated interventions by medical oncology, if any: no  Signs/Symptoms  Weight changes, if any: retains fluid  Respiratory complaints, if any: On 3 Liters nasal cannula for heart, very short of breath on exertion.  Hemoptysis, if any: none  Pain issues, if any: bil feet  SAFETY ISSUES:  Prior radiation? yes  Pacemaker/ICD? no  Possible current pregnancy? Hysterectomy  Is the patient on methotrexate? no    BP (!) 146/87 (BP Location: Right Arm, Patient Position: Sitting)   Pulse  (!) 54   Temp 97.6 F (36.4 C) (Temporal)   Resp 18   Ht 5' (1.524 m)   SpO2 95%   BMI 31.11 kg/m   Wt Readings from Last 3 Encounters:  12/19/19 159 lb 4.8 oz (72.3 kg)  12/05/19 155 lb (70.3 kg)  12/05/19 161 lb (73 kg)

## 2019-12-26 NOTE — Progress Notes (Addendum)
Radiation Oncology         [phone]) [phone] ________________________________  Name: [name] MRN: [id number]  Date: 12/27/2019  DOB: 1946/05/02  Re-Consultation Note  CC: Plotnikov, [name], MD  Curt Bears, MD    ICD-10-CM   1. Nodule of left lung  R91.1     Diagnosis: Clinical stage IA non-small cell lung cancer status post curative stereotactic radiotherapy to the right upper lobe lung nodule in June 2019 now with growing nodule in the left lower lobe  Narrative:  The patient returns today to discuss radiation treatment options. [facility] was [facility]. Following that, she underwent stereotactic body radiotherapy treatment (SBRT/SRT-VMAT, 6X-FFF) directed at the right lung (18 Gy of 3 fractions for a total dose of 54 Gy) on 12/22/2017, 12/26/2017, and 12/29/2017. She tolerated radiation therapy relatively well.  The patient was seen in follow-up on 02/09/2018 and 06/08/2018. During her last follow-up, she was noted to be clinically stable with good results on her chest CT at that time. She was follow up with radiation and medical oncology in six months following a repeat chest CT. She did not follow-up with radiation oncology but remained under observation by Dr. Julien Nordmann.  Over the last year and a half, the patient has undergone several CT scans of her chest. The most recent chest CT on 12/14/2019 showed continued slow growth of the superior segment of the left lower lobe 0.8 cm solid pulmonary nodule that was suspicious for slow-growing metachronous primary bronchogenic carcinoma versus contralateral metastasis. It also showed stable post-radiation change in the apical right upper lobe, stable 0.8 cm ground-glass posterior right upper lobe pulmonary nodule, stable moderate cardiomegaly, and stable right adrenal adenoma. There was no thoracic adenopathy noted.  The patient was last seen by Dr. Julien Nordmann on 12/19/2019, during which time  they discussed the possibility of SBRT to the growing nodule in addition to a PET scan.  On review of systems, the patient reports chronic dyspnea.  She uses 3 L of oxygen she is able to ambulate a flight of stairs as long as she has her oxygen in place. She denies headaches or visual problems and any other symptoms.    Allergies:  is allergic to ibuprofen, sulfamethoxazole-trimethoprim, sulfonamide derivatives, tape, tramadol, doxycycline, hydrocil [psyllium], bactrim, and red dye.  Meds: Current Outpatient Medications  Medication Sig Dispense Refill   acetaminophen (TYLENOL) 500 MG tablet Take 500 mg by mouth every 6 (six) hours as needed for mild pain or headache.     amoxicillin (AMOXIL) 500 MG capsule TAKE 4 CAPSULES BY MOUTH 1 HOUR PRIOR TO DENTAL WORK     cephALEXin (KEFLEX) 500 MG capsule Take 1 capsule (500 mg total) by mouth 4 (four) times daily. 40 capsule 0   dapsone 25 MG tablet Take 25 mg by mouth daily.      gabapentin (NEURONTIN) 300 MG capsule Take 1 capsule (300 mg total) by mouth 3 (three) times daily. 90 capsule 5   insulin glargine, 1 Unit Dial, (TOUJEO SOLOSTAR) 300 UNIT/ML Solostar Pen Inject 34 Units into the skin every morning. Titrate up by 1 unit a day if needed for goal sugars of 100-130 up to 30 units a day max 3 pen 11   Insulin Pen Needle (B-D UF III MINI PEN NEEDLES) 31G X 5 MM MISC USE TO ADMINISTER INSULIN FOUR TIMES A DAY DX E11.9 100 each 1   levothyroxine (SYNTHROID) 25 MCG tablet TAKE 2 TABLETS (50 MCG TOTAL)  BY MOUTH DAILY. (Patient taking differently: Take 50 mcg by mouth daily before breakfast. ) 180 tablet 3   metoprolol tartrate (LOPRESSOR) 25 MG tablet Take 0.5 tablets (12.5 mg total) by mouth 2 (two) times daily. 90 tablet 3   ONETOUCH DELICA LANCETS 70J MISC Use to check blood sugars three times a day DX E11.9 100 each 5   ONETOUCH VERIO test strip USE TO TEST 3 TIMES DAILY. DX E11.9 100 strip 5   oxyCODONE-acetaminophen  (PERCOCET/ROXICET) 5-325 MG tablet Take 1 tablet by mouth every 8 (eight) hours as needed for severe pain. 60 tablet 0   OXYGEN Inhale 3 L into the lungs as needed (for shortness of breath).      potassium chloride SA (KLOR-CON M20) 20 MEQ tablet Take 1 tablet (20 mEq total) by mouth daily. (Patient taking differently: Take 40 mEq by mouth daily. ) 30 tablet 3   predniSONE (DELTASONE) 5 MG tablet Take 5 mg by mouth daily.   5   Propylene Glycol (SYSTANE BALANCE) 0.6 % SOLN Place 1-2 drops into both eyes 3 (three) times daily as needed (for dryness).     repaglinide (PRANDIN) 0.5 MG tablet Take 0.5 mg by mouth 3 (three) times daily before meals.     rosuvastatin (CRESTOR) 20 MG tablet Take 1 tablet (20 mg total) by mouth daily. (Patient taking differently: Take 20 mg by mouth every other day. ) 90 tablet 3   tacrolimus (PROGRAF) 1 MG capsule Take 3 mg by mouth 2 (two) times daily.      torsemide (DEMADEX) 100 MG tablet Take 1 tablet (100 mg total) by mouth daily. 30 tablet 5   triamcinolone cream (KENALOG) 0.1 % Apply 1 application topically 4 (four) times daily. 80 g 1   warfarin (COUMADIN) 5 MG tablet TAKE 1/2 TO 1 TABLET BY MOUTH DAILY AS DIRECTED BY COUMADIN CLINIC 90 tablet 1   No current facility-administered medications for this encounter.    Physical Findings: The patient is in no acute distress. Patient is alert and oriented.  height is 5' (1.524 m). Her temporal temperature is 97.6 F (36.4 C). Her blood pressure is 146/87 (abnormal) and her pulse is 54 (abnormal). Her respiration is 18 and oxygen saturation is 95%.  No significant changes. Lungs are clear to auscultation bilaterally. Heart has regular rate and rhythm. No palpable cervical, supraclavicular, or axillary adenopathy. Abdomen soft, non-tender, normal bowel sounds.  Oxygen in place at 3 L.  The patient remains in a wheelchair for the evaluation.  Lab Findings: Lab Results  Component Value Date   WBC 6.3  12/14/2019   HGB 12.0 12/14/2019   HCT 39.3 12/14/2019   MCV 79.6 (L) 12/14/2019   PLT 199 12/14/2019    Radiographic Findings: CT Chest Wo Contrast  Result Date: 12/14/2019 CLINICAL DATA:  Primary Cancer Type: Lung Imaging Indication: Routine surveillance Interval therapy since last imaging? No Initial Cancer Diagnosis Date: 11/25/2017; Established by: Biopsy-proven Detailed Pathology: Stage IA non-small cell lung cancer, well-differentiated adenocarcinoma Primary Tumor location: Right upper lobe, left lower lobe Chemotherapy: No Immunotherapy? No Radiation therapy? Yes; Date Range: 12/22/2017-12/29/2017; Target: Right lung EXAM: CT CHEST WITHOUT CONTRAST TECHNIQUE: Multidetector CT imaging of the chest was performed following the standard protocol without IV contrast. COMPARISON:  Most recent CT chest 08/24/2019.  11/03/2017 PET-CT. FINDINGS: Cardiovascular: Moderate cardiomegaly, stable. 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 Three-vessel coronary atherosclerosis. No significant pericardial effusion/thickening. Atherosclerotic nonaneurysmal thoracic aorta. Stable dilated main pulmonary artery (3.5 cm diameter). Mediastinum/Nodes: Stable  subcentimeter calcified anterior right thyroid nodule. Not clinically significant; no follow-up imaging recommended (ref: J Am Coll Radiol. 2015 Feb;12(2): 143-50). Unremarkable esophagus. No pathologically enlarged axillary, mediastinal or hilar lymph nodes, noting limited sensitivity for the detection of hilar adenopathy on this noncontrast study. Lungs/Pleura: No pneumothorax. Trace dependent bilateral pleural effusions, similar. Patchy fairly sharply marginated consolidation in the apical right upper lobe with associated volume loss and distortion (series 7/image 32), not appreciably changed, compatible with postradiation change. Posterior right upper lobe 0.8 cm ground-glass pulmonary nodule (series 7/image 41), previously 0.8 cm using similar measurement technique,  stable. Irregular thickened bandlike consolidation in posterior basilar right lower lobe (series 7/image 109) is unchanged, favoring postinfectious/postinflammatory scarring. Superior segment left lower lobe 0.8 cm solid pulmonary nodule (series 7/image 69), previously 0.7 cm on 08/24/2019 and 0.6 cm 06/15/2019, slightly increased. No acute consolidative airspace disease or new significant pulmonary nodules. Upper abdomen: Left nephrectomy. Atrophic right kidney. Partially visualized simple 2.0 cm posterior upper right renal cyst. Right adrenal 1.4 cm nodule with density 17 HU, stable on multiple prior CT studies, compatible with a benign adenoma. Musculoskeletal: No aggressive appearing focal osseous lesions. Intact sternotomy wires. Marked thoracic spondylosis. IMPRESSION: 1. Continued slow growth of superior segment left lower lobe 0.8 cm solid pulmonary nodule, suspicious for slow growing metachronous primary bronchogenic carcinoma versus contralateral metastasis. 2. Stable postradiation change in the apical right upper lobe. 3. Stable 0.8 cm ground-glass posterior right upper lobe pulmonary nodule. 4. No thoracic adenopathy. 5. Stable moderate cardiomegaly, three-vessel coronary atherosclerosis, dilated main pulmonary artery and trace dependent bilateral pleural effusions. 6. Stable right adrenal adenoma. 7.  Aortic Atherosclerosis (ICD10-I70.0). Electronically Signed   By: Ilona Sorrel M.D.   On: 12/14/2019 10:36    Impression: Clinical stage IA non-small cell lung cancer status post curative stereotactic radiotherapy to the right upper lobe lung nodule in June 2019 now with growing nodule in the left lower lobe  Patient would be a candidate for stereotactic body radiation therapy directed at her enlarging lesion in the left lower lobe.  Given the size of the lesion and its slow-growing nature I d not feel a PET scan would give [place] much additional information.  Attempted biopsy of this area would be too  risky given the patient's respiratory status.  we discussed options for management including watchful waiting or proceeding with stereotactic radiation therapy.  Patient has received her first of 2 COVID-19 vaccines and tolerated this well.  I discussed with patient this was a good choice for her particularly given her COVID-19 high risk profile.  She continues to practice social distancing and strict mask wearing given her high risk profile.  She is not getting out of the public at all and her groceries are delivered to her house.  We did review the images while she was in the department and she has a good understanding of treatment given her previous SBRT for her right lung lesion.  She does understand that her breathing could potentially worsen some after her upcoming radiation treatment.  Given the small volume necessary for treatment I am hopeful this will not have a significant impact on her breathing.  After careful evaluation the patient would like to proceed with SBRT for this left lower lung lesion.  Plan: She will be scheduled for SBRT simulation next week with treatments to begin June 24.  Anticipate 3 treatments.  -----------------------------------  Blair Promise, PhD, MD  This document serves as a record of services personally performed  by Gery Pray, MD. It was created on his behalf by Clerance Lav, a trained medical scribe. The creation of this record is based on the scribe's personal observations and the provider's statements to them. This document has been checked and approved by the attending provider.

## 2019-12-27 ENCOUNTER — Ambulatory Visit
Admission: RE | Admit: 2019-12-27 | Discharge: 2019-12-27 | Disposition: A | Discharge status: Home / Self Care | Payer: Medicare Other | Source: Ambulatory Visit | Attending: Radiation Oncology | Admitting: Radiation Oncology

## 2019-12-27 ENCOUNTER — Other Ambulatory Visit: Payer: Self-pay

## 2019-12-27 ENCOUNTER — Encounter: Payer: Self-pay | Admitting: Radiation Oncology

## 2019-12-27 VITALS — BP 146/87 | HR 54 | Temp 97.6°F | Resp 18 | Ht 60.0 in

## 2019-12-27 DIAGNOSIS — R0600 Dyspnea, unspecified: Secondary | ICD-10-CM | POA: Diagnosis not present

## 2019-12-27 DIAGNOSIS — D3501 Benign neoplasm of right adrenal gland: Secondary | ICD-10-CM | POA: Diagnosis not present

## 2019-12-27 DIAGNOSIS — C7802 Secondary malignant neoplasm of left lung: Secondary | ICD-10-CM | POA: Diagnosis present

## 2019-12-27 DIAGNOSIS — J90 Pleural effusion, not elsewhere classified: Secondary | ICD-10-CM | POA: Insufficient documentation

## 2019-12-27 DIAGNOSIS — Z923 Personal history of irradiation: Secondary | ICD-10-CM | POA: Insufficient documentation

## 2019-12-27 DIAGNOSIS — N261 Atrophy of kidney (terminal): Secondary | ICD-10-CM | POA: Diagnosis not present

## 2019-12-27 DIAGNOSIS — I2510 Atherosclerotic heart disease of native coronary artery without angina pectoris: Secondary | ICD-10-CM | POA: Insufficient documentation

## 2019-12-27 DIAGNOSIS — R911 Solitary pulmonary nodule: Secondary | ICD-10-CM | POA: Insufficient documentation

## 2019-12-27 DIAGNOSIS — Z794 Long term (current) use of insulin: Secondary | ICD-10-CM | POA: Diagnosis not present

## 2019-12-27 DIAGNOSIS — N281 Cyst of kidney, acquired: Secondary | ICD-10-CM | POA: Diagnosis not present

## 2019-12-27 DIAGNOSIS — C3411 Malignant neoplasm of upper lobe, right bronchus or lung: Secondary | ICD-10-CM | POA: Insufficient documentation

## 2019-12-27 DIAGNOSIS — Z79899 Other long term (current) drug therapy: Secondary | ICD-10-CM | POA: Insufficient documentation

## 2019-12-27 DIAGNOSIS — Z85118 Personal history of other malignant neoplasm of bronchus and lung: Secondary | ICD-10-CM | POA: Diagnosis not present

## 2019-12-31 ENCOUNTER — Other Ambulatory Visit: Payer: Self-pay

## 2019-12-31 ENCOUNTER — Ambulatory Visit (INDEPENDENT_AMBULATORY_CARE_PROVIDER_SITE_OTHER): Payer: Medicare Other | Admitting: Internal Medicine

## 2019-12-31 ENCOUNTER — Ambulatory Visit
Admission: RE | Admit: 2019-12-31 | Discharge: 2019-12-31 | Disposition: A | Discharge status: Home / Self Care | Payer: Medicare Other | Source: Ambulatory Visit | Attending: Radiation Oncology | Admitting: Radiation Oncology

## 2019-12-31 ENCOUNTER — Encounter: Payer: Self-pay | Admitting: Internal Medicine

## 2019-12-31 ENCOUNTER — Ambulatory Visit: Payer: Medicare Other | Admitting: Internal Medicine

## 2019-12-31 ENCOUNTER — Ambulatory Visit (INDEPENDENT_AMBULATORY_CARE_PROVIDER_SITE_OTHER): Payer: Medicare Other

## 2019-12-31 VITALS — BP 188/82 | HR 56 | Temp 98.4°F | Ht 60.0 in | Wt 164.0 lb

## 2019-12-31 DIAGNOSIS — Z794 Long term (current) use of insulin: Secondary | ICD-10-CM

## 2019-12-31 DIAGNOSIS — R609 Edema, unspecified: Secondary | ICD-10-CM | POA: Diagnosis not present

## 2019-12-31 DIAGNOSIS — I2510 Atherosclerotic heart disease of native coronary artery without angina pectoris: Secondary | ICD-10-CM | POA: Diagnosis not present

## 2019-12-31 DIAGNOSIS — R5382 Chronic fatigue, unspecified: Secondary | ICD-10-CM

## 2019-12-31 DIAGNOSIS — E1121 Type 2 diabetes mellitus with diabetic nephropathy: Secondary | ICD-10-CM

## 2019-12-31 DIAGNOSIS — I872 Venous insufficiency (chronic) (peripheral): Secondary | ICD-10-CM | POA: Insufficient documentation

## 2019-12-31 DIAGNOSIS — Z923 Personal history of irradiation: Secondary | ICD-10-CM | POA: Insufficient documentation

## 2019-12-31 DIAGNOSIS — Z510 Encounter for antineoplastic radiation therapy: Secondary | ICD-10-CM | POA: Insufficient documentation

## 2019-12-31 DIAGNOSIS — C3411 Malignant neoplasm of upper lobe, right bronchus or lung: Secondary | ICD-10-CM | POA: Insufficient documentation

## 2019-12-31 DIAGNOSIS — M7989 Other specified soft tissue disorders: Secondary | ICD-10-CM | POA: Diagnosis not present

## 2019-12-31 DIAGNOSIS — Z9981 Dependence on supplemental oxygen: Secondary | ICD-10-CM | POA: Insufficient documentation

## 2019-12-31 DIAGNOSIS — Z0000 Encounter for general adult medical examination without abnormal findings: Secondary | ICD-10-CM

## 2019-12-31 DIAGNOSIS — R911 Solitary pulmonary nodule: Secondary | ICD-10-CM | POA: Insufficient documentation

## 2019-12-31 DIAGNOSIS — R269 Unspecified abnormalities of gait and mobility: Secondary | ICD-10-CM

## 2019-12-31 DIAGNOSIS — J9611 Chronic respiratory failure with hypoxia: Secondary | ICD-10-CM | POA: Diagnosis not present

## 2019-12-31 MED ORDER — TORSEMIDE 100 MG PO TABS: 200.0000 mg | ORAL_TABLET | Freq: Every day | ORAL | 5 refills | Status: DC

## 2019-12-31 MED ORDER — CEPHALEXIN 500 MG PO CAPS: 500.0000 mg | ORAL_CAPSULE | Freq: Four times a day (QID) | ORAL | 0 refills | Status: DC

## 2019-12-31 MED ORDER — TOUJEO SOLOSTAR 300 UNIT/ML ~~LOC~~ SOPN: 34.0000 [IU] | PEN_INJECTOR | Freq: Every morning | SUBCUTANEOUS | 11 refills | Status: DC

## 2019-12-31 MED ORDER — OXYCODONE-ACETAMINOPHEN 5-325 MG PO TABS: 1.0000 | ORAL_TABLET | Freq: Three times a day (TID) | ORAL | 0 refills | Status: DC | PRN

## 2019-12-31 NOTE — Assessment & Plan Note (Signed)
No change 

## 2019-12-31 NOTE — Assessment & Plan Note (Signed)
The swelling is better but not normal yet... Torsemide was changed to 200 mg/d by Nephrology  Keflex

## 2019-12-31 NOTE — Assessment & Plan Note (Signed)
In a w/c 

## 2019-12-31 NOTE — Patient Instructions (Addendum)
[name] , Thank you for taking time to come for your Medicare Wellness Visit. I appreciate your ongoing commitment to your health goals. Please review the following plan we discussed and let me know if I can assist you in the future.   Screening recommendations/referrals: Colonoscopy: last done 06/19/2019 Mammogram: last done 03/13/2019; due every year Bone Density: never done Recommended yearly ophthalmology/optometry visit for glaucoma screening and checkup Recommended yearly dental visit for hygiene and checkup  Vaccinations: Influenza vaccine: declined Pneumococcal vaccine: completed Tdap vaccine: 07/23/2019; due every 10 years Shingles vaccine: never done   Covid-19: received first dose; need second dose of Moderna  Advanced directives: Advance directive discussed with you today. Even though you declined this today please call our office should you change your mind and we can give you the proper paperwork for you to fill out.  Conditions/risks identified: Yes. Referral placed in-home assistance with bathing, medication management and dressing.  Next appointment: Please schedule your next annual wellness visit in 1 year.  Preventive Care 76 Years and Older, Female Preventive care refers to lifestyle choices and visits with your health care provider that can promote health and wellness. What does preventive care include?  A yearly physical exam. This is also called an annual well check.  Dental exams once or twice a year.  Routine eye exams. Ask your health care provider how often you should have your eyes checked.  Personal lifestyle choices, including:  Daily care of your teeth and gums.  Regular physical activity.  Eating a healthy diet.  Avoiding tobacco and drug use.  Limiting alcohol use.  Practicing safe sex.  Taking low-dose aspirin every day.  Taking vitamin and mineral supplements as recommended by your health care provider. What happens during an annual  well check? The services and screenings done by your health care provider during your annual well check will depend on your age, overall health, lifestyle risk factors, and family history of disease. Counseling  Your health care provider may ask you questions about your:  Alcohol use.  Tobacco use.  Drug use.  Emotional well-being.  Home and relationship well-being.  Sexual activity.  Eating habits.  History of falls.  Memory and ability to understand (cognition).  Work and work Statistician.  Reproductive health. Screening  You may have the following tests or measurements:  Height, weight, and BMI.  Blood pressure.  Lipid and cholesterol levels. These may be checked every 5 years, or more frequently if you are over 15 years old.  Skin check.  Lung cancer screening. You may have this screening every year starting at age 43 if you have a 30-pack-year history of smoking and currently smoke or have quit within the past 15 years.  Fecal occult blood test (FOBT) of the stool. You may have this test every year starting at age 75.  Flexible sigmoidoscopy or colonoscopy. You may have a sigmoidoscopy every 5 years or a colonoscopy every 10 years starting at age 25.  Hepatitis C blood test.  Hepatitis B blood test.  Sexually transmitted disease (STD) testing.  Diabetes screening. This is done by checking your blood sugar (glucose) after you have not eaten for a while (fasting). You may have this done every 1-3 years.  Bone density scan. This is done to screen for osteoporosis. You may have this done starting at age 73.  Mammogram. This may be done every 1-2 years. Talk to your health care provider about how often you should have regular mammograms. Talk with  your health care provider about your test results, treatment options, and if necessary, the need for more tests. Vaccines  Your health care provider may recommend certain vaccines, such as:  Influenza vaccine. This  is recommended every year.  Tetanus, diphtheria, and acellular pertussis (Tdap, Td) vaccine. You may need a Td booster every 10 years.  Zoster vaccine. You may need this after age 71.  Pneumococcal 13-valent conjugate (PCV13) vaccine. One dose is recommended after age 69.  Pneumococcal polysaccharide (PPSV23) vaccine. One dose is recommended after age 27. Talk to your health care provider about which screenings and vaccines you need and how often you need them. This information is not intended to replace advice given to you by your health care provider. Make sure you discuss any questions you have with your health care provider. Document Released: 08/01/2015 Document Revised: 03/24/2016 Document Reviewed: 05/06/2015 Elsevier Interactive Patient Education  2017 [employer] Prevention in the Home Falls can cause injuries. They can happen to people of all ages. There are many things you can do to make your home safe and to help prevent falls. What can I do on the outside of my home?  Regularly fix the edges of walkways and driveways and fix any cracks.  Remove anything that might make you trip as you walk through a door, such as a raised step or threshold.  Trim any bushes or trees on the path to your home.  Use bright outdoor lighting.  Clear any walking paths of anything that might make someone trip, such as rocks or tools.  Regularly check to see if handrails are loose or broken. Make sure that both sides of any steps have handrails.  Any raised decks and porches should have guardrails on the edges.  Have any leaves, snow, or ice cleared regularly.  Use sand or salt on walking paths during winter.  Clean up any spills in your garage right away. This includes oil or grease spills. What can I do in the bathroom?  Use night lights.  Install grab bars by the toilet and in the tub and shower. Do not use towel bars as grab bars.  Use non-skid mats or decals in the tub or  shower.  If you need to sit down in the shower, use a plastic, non-slip stool.  Keep the floor dry. Clean up any water that spills on the floor as soon as it happens.  Remove soap buildup in the tub or shower regularly.  Attach bath mats securely with double-sided non-slip rug tape.  Do not have throw rugs and other things on the floor that can make you trip. What can I do in the bedroom?  Use night lights.  Make sure that you have a light by your bed that is easy to reach.  Do not use any sheets or blankets that are too big for your bed. They should not hang down onto the floor.  Have a firm chair that has side arms. You can use this for support while you get dressed.  Do not have throw rugs and other things on the floor that can make you trip. What can I do in the kitchen?  Clean up any spills right away.  Avoid walking on wet floors.  Keep items that you use a lot in easy-to-reach places.  If you need to reach something above you, use a strong step stool that has a grab bar.  Keep electrical cords out of the way.  Do not  use floor polish or wax that makes floors slippery. If you must use wax, use non-skid floor wax.  Do not have throw rugs and other things on the floor that can make you trip. What can I do with my stairs?  Do not leave any items on the stairs.  Make sure that there are handrails on both sides of the stairs and use them. Fix handrails that are broken or loose. Make sure that handrails are as long as the stairways.  Check any carpeting to make sure that it is firmly attached to the stairs. Fix any carpet that is loose or worn.  Avoid having throw rugs at the top or bottom of the stairs. If you do have throw rugs, attach them to the floor with carpet tape.  Make sure that you have a light switch at the top of the stairs and the bottom of the stairs. If you do not have them, ask someone to add them for you. What else can I do to help prevent  falls?  Wear shoes that:  Do not have high heels.  Have rubber bottoms.  Are comfortable and fit you well.  Are closed at the toe. Do not wear sandals.  If you use a stepladder:  Make sure that it is fully opened. Do not climb a closed stepladder.  Make sure that both sides of the stepladder are locked into place.  Ask someone to hold it for you, if possible.  Clearly mark and make sure that you can see:  Any grab bars or handrails.  First and last steps.  Where the edge of each step is.  Use tools that help you move around (mobility aids) if they are needed. These include:  Canes.  Walkers.  Scooters.  Crutches.  Turn on the lights when you go into a dark area. Replace any light bulbs as soon as they burn out.  Set up your furniture so you have a clear path. Avoid moving your furniture around.  If any of your floors are uneven, fix them.  If there are any pets around you, be aware of where they are.  Review your medicines with your doctor. Some medicines can make you feel dizzy. This can increase your chance of falling. Ask your doctor what other things that you can do to help prevent falls. This information is not intended to replace advice given to you by your health care provider. Make sure you discuss any questions you have with your health care provider. Document Released: 05/01/2009 Document Revised: 12/11/2015 Document Reviewed: 08/09/2014 Elsevier Interactive Patient Education  2017 [employer].

## 2019-12-31 NOTE — Progress Notes (Addendum)
Subjective:   [name] is a 74 y.o. female who presents for Medicare Annual (Subsequent) preventive examination.  Review of Systems:  No ROS. Medicare Wellness Visit Cardiac Risk Factors include: advanced age (>51men, >56 women);diabetes mellitus;hypertension;family history of premature cardiovascular disease;obesity (BMI >30kg/m2)     Objective:     Vitals: BP (!) 188/82 (BP Location: Right Arm, Patient Position: Sitting, Cuff Size: Normal)   Pulse (!) 56   Temp 98.4 F (36.9 C)   Ht 5' (1.524 m)   Wt 164 lb (74.4 kg)   BMI 32.03 kg/m   Body mass index is 32.03 kg/m.  Advanced Directives 12/31/2019 12/27/2019 10/23/2019 10/02/2019 07/14/2019 06/22/2019 12/21/2018  Does Patient Have a Medical Advance Directive? No - Yes Yes No No Yes  Type of Advance Directive - [place];Living will Healthcare Power of [place]  Does patient want to make changes to medical advance directive? No - Patient declined No - Patient declined - No - Patient declined - - No - Patient declined  Copy of [place] in Chart? - - - - - - No - copy requested  Would patient like information on creating a medical advance directive? - No - Patient declined - - No - Patient declined No - Patient declined -  Pre-existing out of facility DNR order (yellow form or pink MOST form) - - - - - - -    Tobacco Social History   Tobacco Use  Smoking Status Former Smoker   Packs/day: 1.00   Years: 30.00   Pack years: 30.00   Types: Cigarettes   Quit date: 07/19/2001   Years since quitting: 18.4  Smokeless Tobacco Never Used     Counseling given: No   Clinical Intake:  Pre-visit preparation completed: Yes  Pain : 0-10 Pain Score: 6  Pain Type: Chronic pain Pain Location: Leg Pain Orientation: Right Pain Descriptors / Indicators: Aching, Other (Comment) (oozing fluid) Pain Onset: More than a month ago Pain  Frequency: Constant Pain Relieving Factors: Pain Medication Effect of Pain on Daily Activities: Yes  Pain Relieving Factors: Pain Medication  BMI - recorded: 32.03 Nutritional Status: BMI > 30  Obese Nutritional Risks: None Diabetes: Yes CBG done?: No Did pt. bring in CBG monitor from home?: No  How often do you need to have someone help you when you read instructions, pamphlets, or other written materials from your doctor or pharmacy?: 1 - Never What is the last grade level you completed in school?: 3 years of college; Retired Health and safety inspector Needed?: No  Information entered by :: [employer]. Kawan [place], LPN  Past Medical History:  Diagnosis Date   Acute CHF (congestive heart failure) ([place]) 12/2018   Adenocarcinoma of lung, stage 1, right ([place]) 11/25/2017   Anemia, iron deficiency    of chronic disease   Aortic stenosis    a. Severe AS by echo 11/2012.   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 During ENT surgery 2005: developed marked asystole requiring CPR, felt due to vagal reaction (cath nonobst dz).   Carotid artery disease ([place])    a. Carotid Dopplers performed in August 2013 showed 40-59% left stenosis and 0-39% right; f/u recommended in 2 years.    Cerebrovascular accident [facility]) 2009   a. LMCA infarct felt embolic 7408, maintained on chronic coumadin.; denies residual on 04/05/2013   Cholelithiasis  Chronic Persistent Atrial Fibrillation 12/31/2008   Qualifier: Diagnosis of  By: [name]     Coronary artery disease 05/2002   a. Ant MI 2003 s/p PTCA/stent to RCA.    Diverticulosis of colon    Esophagitis, reflux    ESRD (end stage renal disease) ([place])    a. Mass on L kidney per pt s/p nephrectomy - pt states not cancer - [facility] notes indicate ESRD due to HTN/DM - was previously on HD. b. Kidney transplant 02/2011.   GERD (gastroesophageal reflux disease)    Gout    Hearing loss    Helicobacter pylori (H. pylori) infection     hx of   Hemorrhoids    Hx of colonic polyps    adenomatous   Hyperlipidemia    Hypertension    Lung nodule seen on imaging study 04/07/2013   1.0 cm ground glass opacity RUL   Myocardial infarction [facility]) 2003   Pericardial effusion    a. Small by echo 11/2011.   S/P aortic valve replacement with bioprosthetic valve and maze procedure 04/12/2013   54mm [facility] Ease bovine pericardial tissue valve    S/P Maze operation for atrial fibrillation 04/12/2013   Complete bilateral atrial lesion set using bipolar radiofrequency and cryothermy ablation with clipping of LA appendage   Sleep apnea    Pt says testing was positive, intolerant of CPAP.   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   Stroke [facility])    Type II diabetes mellitus ([place])    Past Surgical History:  Procedure Laterality Date   AORTIC VALVE REPLACEMENT N/A 04/12/2013   Procedure: AORTIC VALVE REPLACEMENT (AVR);  Surgeon: Rexene Alberts, MD;  Location: [place];  Service: Open Heart Surgery;  Laterality: N/A;   ARTERIOVENOUS GRAFT PLACEMENT Left    ARTERIOVENOUS GRAFT PLACEMENT Left    "I've had 2 on my left; had one removed" (04/05/2013)    ARTERY EXPLORATION Right 04/11/2013   Procedure: ARTERY EXPLORATION;  Surgeon: Rexene Alberts, MD;  Location: [place];  Service: Open Heart Surgery;  Laterality: Right;  Right carotid artery exploration   AV FISTULA PLACEMENT Right    AV FISTULA REPAIR Right    "took it out" ((/18/2014)   CARDIOVERSION  05/29/2012   Procedure: CARDIOVERSION;  Surgeon: Lelon Perla, MD;  Location: [facility] ENDOSCOPY;  Service: Cardiovascular;  Laterality: N/A;   CHOLECYSTECTOMY  2009   with hernia removal   CORONARY ANGIOPLASTY WITH STENT PLACEMENT Right    coronary artery   INSERTION OF DIALYSIS CATHETER Bilateral    "over the years; took them  both out" (04/05/2013)   INTRAOPERATIVE TRANSESOPHAGEAL ECHOCARDIOGRAM N/A 04/11/2013   Procedure: INTRAOPERATIVE TRANSESOPHAGEAL ECHOCARDIOGRAM;  Surgeon: Rexene Alberts, MD;  Location: [place];  Service: Open Heart Surgery;  Laterality: N/A;   INTRAOPERATIVE TRANSESOPHAGEAL ECHOCARDIOGRAM N/A 04/12/2013   Procedure: INTRAOPERATIVE TRANSESOPHAGEAL ECHOCARDIOGRAM;  Surgeon: Rexene Alberts, MD;  Location: [place];  Service: Open Heart Surgery;  Laterality: N/A;   KIDNEY TRANSPLANT  03/16/11   LEFT AND RIGHT HEART CATHETERIZATION WITH CORONARY ANGIOGRAM N/A 04/06/2013   Procedure: LEFT AND RIGHT HEART CATHETERIZATION WITH CORONARY ANGIOGRAM;  Surgeon: Blane Ohara, MD;  Location: [facility] CATH LAB;  Service: Cardiovascular;  Laterality: N/A;   MAZE N/A 04/12/2013   Procedure: MAZE;  Surgeon: Rexene Alberts, MD;  Location: [place];  Service:  Open Heart Surgery;  Laterality: N/A;   NASAL RECONSTRUCTION WITH SEPTAL REPAIR     "took it out" (04/05/2013)   NEPHRECTOMY Left 2010   no CA on bx   TONSILLECTOMY     TOTAL ABDOMINAL HYSTERECTOMY     TUBAL LIGATION     Family History  Problem Relation Age of Onset   Stroke Father    Hypertension Mother    Diabetes Other    Diabetes Maternal Grandmother    Diabetes Son    Crohn's disease Other        grandson    Breast cancer Neg Hx    Stomach cancer Neg Hx    Esophageal cancer Neg Hx    Colon cancer Neg Hx    Pancreatic cancer Neg Hx    Social History   Socioeconomic History   Marital status: Married    Spouse name: Not on file   Number of children: 5   Years of education: 15   Highest education level: Not on file  Occupational History   Occupation: retired    Fish farm manager: RETIRED  Tobacco Use   Smoking status: Former Smoker    Packs/day: 1.00    Years: 30.00    Pack years: 30.00    Types: Cigarettes    Quit date: 07/19/2001    Years since quitting: 18.4   Smokeless tobacco: Never Used  Scientific laboratory technician Use: Never used  Substance and  Sexual Activity   Alcohol use: No   Drug use: No   Sexual activity: Not Currently  Other Topics Concern   Not on file  Social History Narrative   Patient signed a Designated Party Release to allow her spouse [name] and family and five children to have access to her medical records/information.     Patient lives with husband in a 2 story home.  Has 5 children. Retired from Personal assistant.  Education: 3 years of college.    Social Determinants of Health   Financial Resource Strain:    Difficulty of Paying Living Expenses:   Food Insecurity: No Food Insecurity   Worried About Charity fundraiser in the Last Year: Never true   Ran Out of Food in the Last Year: Never true  Transportation Needs: No Transportation Needs   Lack of Transportation (Medical): No   Lack of Transportation (Non-Medical): No  Physical Activity: Inactive   Days of Exercise per Week: 0 days   Minutes of Exercise per Session: 0 min  Stress: Stress Concern Present   Feeling of Stress : Rather much  Social Connections:    Frequency of Communication with Friends and Family:    Frequency of Social Gatherings with Friends and Family:    Attends Religious Services:    Active Member of Clubs or Organizations:    Attends Archivist Meetings:    Marital Status:     Outpatient Encounter Medications as of 12/31/2019  Medication Sig   acetaminophen (TYLENOL) 500 MG tablet Take 500 mg by mouth every 6 (six) hours as needed for mild pain or headache.   amoxicillin (AMOXIL) 500 MG capsule TAKE 4 CAPSULES BY MOUTH 1 HOUR PRIOR TO DENTAL WORK   dapsone 25 MG tablet Take 25 mg by mouth daily.    gabapentin (NEURONTIN) 300 MG capsule Take 1 capsule (300 mg total) by mouth 3 (three) times daily.   Insulin Pen Needle (B-D UF III MINI PEN NEEDLES) 31G X 5 MM MISC USE TO ADMINISTER INSULIN  FOUR TIMES A DAY DX E11.9   levothyroxine (SYNTHROID) 25 MCG tablet TAKE 2 TABLETS (50 MCG TOTAL) BY MOUTH DAILY. (Patient taking  differently: Take 50 mcg by mouth daily before breakfast. )   metoprolol tartrate (LOPRESSOR) 25 MG tablet Take 0.5 tablets (12.5 mg total) by mouth 2 (two) times daily.   ONETOUCH DELICA LANCETS 17G MISC Use to check blood sugars three times a day DX E11.9   ONETOUCH VERIO test strip USE TO TEST 3 TIMES DAILY. DX E11.9   OXYGEN Inhale 3 L into the lungs as needed (for shortness of breath).    potassium chloride SA (KLOR-CON M20) 20 MEQ tablet Take 1 tablet (20 mEq total) by mouth daily. (Patient taking differently: Take 40 mEq by mouth daily. )   predniSONE (DELTASONE) 5 MG tablet Take 5 mg by mouth daily.    Propylene Glycol (SYSTANE BALANCE) 0.6 % SOLN Place 1-2 drops into both eyes 3 (three) times daily as needed (for dryness).   repaglinide (PRANDIN) 0.5 MG tablet Take 0.5 mg by mouth 3 (three) times daily before meals.   rosuvastatin (CRESTOR) 20 MG tablet Take 1 tablet (20 mg total) by mouth daily. (Patient taking differently: Take 20 mg by mouth every other day. )   tacrolimus (PROGRAF) 1 MG capsule Take 3 mg by mouth 2 (two) times daily.    triamcinolone cream (KENALOG) 0.1 % Apply 1 application topically 4 (four) times daily.   warfarin (COUMADIN) 5 MG tablet TAKE 1/2 TO 1 TABLET BY MOUTH DAILY AS DIRECTED BY COUMADIN CLINIC   [DISCONTINUED] cephALEXin (KEFLEX) 500 MG capsule Take 1 capsule (500 mg total) by mouth 4 (four) times daily.   [DISCONTINUED] insulin glargine, 1 Unit Dial, (TOUJEO SOLOSTAR) 300 UNIT/ML Solostar Pen Inject 34 Units into the skin every morning. Titrate up by 1 unit a day if needed for goal sugars of 100-130 up to 30 units a day max   [DISCONTINUED] insulin glargine, 1 Unit Dial, (TOUJEO SOLOSTAR) 300 UNIT/ML Solostar Pen Inject 34 Units into the skin every morning. Titrate up by 1 unit a day if needed for goal sugars of 100-130 up to 50 units a day max   [DISCONTINUED] oxyCODONE-acetaminophen (PERCOCET/ROXICET) 5-325 MG tablet Take 1 tablet by mouth every 8 (eight)  hours as needed for severe pain.   [DISCONTINUED] torsemide (DEMADEX) 100 MG tablet Take 1 tablet (100 mg total) by mouth daily.   No facility-administered encounter medications on file as of 12/31/2019.    Activities of Daily Living In your present state of health, do you have any difficulty performing the following activities: 12/31/2019 10/23/2019  Hearing? N Y  [place]? N Y  Comment - -  Difficulty concentrating or making decisions? Y Y  Comment - states often.  Walking or climbing stairs? Y Y  Comment - has shortness of breath with activity  Dressing or bathing? Y Y  Comment - takes her time getting it completed.  Doing errands, shopping? Y Y  Comment - unabe to Physiological scientist and eating ? Y Y  Comment - family helps with meals  Using the Toilet? Y N  In the past six months, have you accidently leaked urine? N N  Comment - -  Do you have problems with loss of bowel control? N N  Managing your Medications? Y N  Managing your Finances? Y N  Housekeeping or managing your Housekeeping? Y Y  Comment - husband helps  Some recent data might  be hidden    Patient Care Team: Plotnikov, [name], MD as PCP - General (Internal Medicine) [name] Denice Bors, MD as PCP - Cardiology (Cardiology) Tanda Rockers, MD (Pulmonary Disease) Zella Richer, MD as Referring Physician (Transplant) [name] Denice Bors, MD (Cardiology) Curt Bears, MD as Consulting Physician (Oncology) Thana Ates, RN as [place] Management    Assessment:   This is a routine wellness examination for [place].  Exercise Activities and Dietary recommendations Current Exercise Habits: The patient does not participate in regular exercise at present, Exercise limited by: cardiac condition(s);respiratory conditions(s);orthopedic condition(s);neurologic condition(s)  Goals   None     Fall Risk Fall Risk  12/31/2019 10/23/2019 08/03/2019 06/22/2019 12/28/2018  Falls in the  past year? 1 1 1  0 0  Comment - - - - -  Number falls in past yr: 1 0 0 - -  Comment - - Fall 07/13/2019 - -  Injury with Fall? 1 1 1  - -  Comment - - large hematoma to left arm - -  Risk for fall due to : History of fall(s);Impaired balance/gait;Impaired mobility History of fall(s) History of fall(s);Medication side effect;Impaired balance/gait;Impaired mobility Medication side effect;Impaired mobility;Impaired balance/gait -  Follow up Falls evaluation completed - Education provided;Falls prevention discussed;Falls evaluation completed Falls evaluation completed;Education provided;Falls prevention discussed -   Is the patient's home free of loose throw rugs in walkways, pet beds, electrical cords, etc?   yes      Grab bars in the bathroom? yes      Handrails on the stairs?   yes      Adequate lighting?   yes  Timed Get Up and Go performed: Not indicated  Depression Screen PHQ 2/9 Scores 12/31/2019 10/23/2019 06/22/2019 12/28/2018  PHQ - 2 Score 0 0 1 0     Cognitive Function:      6CIT Screen 12/31/2019  What Year? 0 points  What month? 0 points  What time? 3 points  Count back from 20 0 points  Months in reverse 2 points  Repeat phrase 2 points  Total Score 7    Immunization History  Administered Date(s) Administered   Hepatitis B 06/09/2007, 07/05/2007, 02/13/2008   Influenza Split 07/24/2011   Influenza Whole 06/09/2007, 04/02/2010, 02/17/2011   Influenza, High Dose Seasonal PF 06/28/2016   Influenza-Unspecified 04/18/2013   Moderna SARS-COVID-2 Vaccination 12/19/2019   Pneumococcal Conjugate-13 10/09/2013   Pneumococcal Polysaccharide-23 11/09/2009, 09/02/2017   Tdap 07/23/2019    Qualifies for Shingles Vaccine? Yes  Screening Tests Health Maintenance  Topic Date Due   Hepatitis C Screening  Never done   DEXA SCAN  Never done   OPHTHALMOLOGY EXAM  05/04/2017   FOOT EXAM  06/29/2017   URINE MICROALBUMIN  07/02/2017   COVID-19 Vaccine (2 - Moderna 2-dose series)  01/16/2020   INFLUENZA VACCINE  02/17/2020   HEMOGLOBIN A1C  04/03/2020   COLONOSCOPY  06/09/2020   TETANUS/TDAP  07/22/2029   PNA vac Low Risk Adult  Completed    Cancer Screenings: Lung: Low Dose CT Chest recommended if Age 80-80 years, 30 pack-year currently smoking OR have quit w/in 15years. Patient does qualify. Breast:  Up to date on Mammogram? Yes   Up to date of Bone Density/Dexa? No Colorectal: Yes  Additional Screenings: Hepatitis C Screening: never done      Plan:     Reviewed health maintenance screenings with patient today and relevant education, vaccines, and/or referrals were provided.    Continue doing brain stimulating  activities (puzzles, reading, adult coloring books, staying active) to keep memory sharp.    Continue to eat heart healthy diet (full of fruits, vegetables, whole grains, lean protein, water--limit salt, fat, and sugar intake) and increase physical activity as tolerated.  I have personally reviewed and noted the following in the patient's chart:   Medical and social history Use of alcohol, tobacco or illicit drugs  Current medications and supplements Functional ability and status Nutritional status Physical activity Advanced directives List of other physicians Hospitalizations, surgeries, and ER visits in previous 12 months Vitals Screenings to include cognitive, depression, and falls Referrals and appointments  In addition, I have reviewed and discussed with patient certain preventive protocols, quality metrics, and best practice recommendations. A written personalized care plan for preventive services as well as general preventive health recommendations were provided to patient.     Sheral Flow, LPN  [id number]  Nurse Health Advisor  Nurse Notes: Referral: Patient needs in-home care due to needing medical assistance with medication management, bathing, dressing and etc.   Medical screening examination/treatment/procedure(s)  were performed by non-physician practitioner and as supervising physician I was immediately available for consultation/collaboration.  I agree with above. Lew Dawes, MD

## 2019-12-31 NOTE — Assessment & Plan Note (Signed)
The swelling is better but not normal yet... Torsemide was changed to 200 mg/d by Nephrology

## 2019-12-31 NOTE — Progress Notes (Signed)
Subjective:  [name], female    DOB: 23-Feb-1946  Age: 74 y.o. MRN: [id number]  CC: No chief complaint on file.   HPI [facility] presents for LE weakness, LBP, cellulitis on the RLE, LLE ulcer Cellulitis and swelling is better but not normal yet... Torsemide was changed to 200 mg/d by Nephrology  Outpatient Medications Prior to Visit  Medication Sig Dispense Refill  . acetaminophen (TYLENOL) 500 MG tablet Take 500 mg by mouth every 6 (six) hours as needed for mild pain or headache.    Marland Kitchen amoxicillin (AMOXIL) 500 MG capsule TAKE 4 CAPSULES BY MOUTH 1 HOUR PRIOR TO DENTAL WORK    . cephALEXin (KEFLEX) 500 MG capsule Take 1 capsule (500 mg total) by mouth 4 (four) times daily. 40 capsule 0  . dapsone 25 MG tablet Take 25 mg by mouth daily.     Marland Kitchen gabapentin (NEURONTIN) 300 MG capsule Take 1 capsule (300 mg total) by mouth 3 (three) times daily. 90 capsule 5  . insulin glargine, 1 Unit Dial, (TOUJEO SOLOSTAR) 300 UNIT/ML Solostar Pen Inject 34 Units into the skin every morning. Titrate up by 1 unit a day if needed for goal sugars of 100-130 up to 30 units a day max 3 pen 11  . Insulin Pen Needle (B-D UF III MINI PEN NEEDLES) 31G X 5 MM MISC USE TO ADMINISTER INSULIN FOUR TIMES A DAY DX E11.9 100 each 1  . levothyroxine (SYNTHROID) 25 MCG tablet TAKE 2 TABLETS (50 MCG TOTAL) BY MOUTH DAILY. (Patient taking differently: Take 50 mcg by mouth daily before breakfast. ) 180 tablet 3  . metoprolol tartrate (LOPRESSOR) 25 MG tablet Take 0.5 tablets (12.5 mg total) by mouth 2 (two) times daily. 90 tablet 3  . ONETOUCH DELICA LANCETS 06T MISC Use to check blood sugars three times a day DX E11.9 100 each 5  . ONETOUCH VERIO test strip USE TO TEST 3 TIMES DAILY. DX E11.9 100 strip 5  . oxyCODONE-acetaminophen (PERCOCET/ROXICET) 5-325 MG tablet Take 1 tablet by mouth every 8 (eight) hours as needed for severe pain. 60 tablet 0  . OXYGEN Inhale 3 L into the lungs as needed (for shortness of  breath).     . predniSONE (DELTASONE) 5 MG tablet Take 5 mg by mouth daily.   5  . Propylene Glycol (SYSTANE BALANCE) 0.6 % SOLN Place 1-2 drops into both eyes 3 (three) times daily as needed (for dryness).    . repaglinide (PRANDIN) 0.5 MG tablet Take 0.5 mg by mouth 3 (three) times daily before meals.    . rosuvastatin (CRESTOR) 20 MG tablet Take 1 tablet (20 mg total) by mouth daily. (Patient taking differently: Take 20 mg by mouth every other day. ) 90 tablet 3  . tacrolimus (PROGRAF) 1 MG capsule Take 3 mg by mouth 2 (two) times daily.     Marland Kitchen torsemide (DEMADEX) 100 MG tablet Take 1 tablet (100 mg total) by mouth daily. 30 tablet 5  . triamcinolone cream (KENALOG) 0.1 % Apply 1 application topically 4 (four) times daily. 80 g 1  . warfarin (COUMADIN) 5 MG tablet TAKE 1/2 TO 1 TABLET BY MOUTH DAILY AS DIRECTED BY COUMADIN CLINIC 90 tablet 1  . potassium chloride SA (KLOR-CON M20) 20 MEQ tablet Take 1 tablet (20 mEq total) by mouth daily. (Patient taking differently: Take 40 mEq by mouth daily. ) 30 tablet 3   No facility-administered medications prior to visit.    ROS: Review of  Systems  Constitutional: Positive for fatigue. Negative for activity change, appetite change, chills and unexpected weight change.  HENT: Negative for congestion, mouth sores and sinus pressure.   Eyes: Negative for visual disturbance.  Respiratory: Negative for cough and chest tightness.   Cardiovascular: Positive for leg swelling.  Gastrointestinal: Negative for abdominal pain and nausea.  Genitourinary: Negative for difficulty urinating, frequency and vaginal pain.  Musculoskeletal: Positive for back pain and gait problem.  Skin: Positive for rash and wound. Negative for pallor.  Neurological: Positive for weakness. Negative for dizziness, tremors, numbness and headaches.  Psychiatric/Behavioral: Negative for confusion and sleep disturbance.    Objective:  BP (!) 188/82 (BP Location: Right Arm, Patient  Position: Sitting, Cuff Size: Normal)   Pulse (!) 56   Temp 98.4 F (36.9 C) (Oral)   Ht 5' (1.524 m)   Wt 164 lb (74.4 kg)   SpO2 92%   BMI 32.03 kg/m   BP Readings from Last 3 Encounters:  12/31/19 (!) 188/82  12/27/19 (!) 146/87  12/19/19 (!) 171/74    Wt Readings from Last 3 Encounters:  12/31/19 164 lb (74.4 kg)  12/19/19 159 lb 4.8 oz (72.3 kg)  12/05/19 155 lb (70.3 kg)    Physical Exam Constitutional:      General: She is not in acute distress.    Appearance: She is well-developed.  HENT:     Head: Normocephalic.     Right Ear: External ear normal.     Left Ear: External ear normal.     Nose: Nose normal.  Eyes:     General:        Right eye: No discharge.        Left eye: No discharge.     Conjunctiva/sclera: Conjunctivae normal.     Pupils: Pupils are equal, round, and reactive to light.  Neck:     Thyroid: No thyromegaly.     Vascular: No JVD.     Trachea: No tracheal deviation.  Cardiovascular:     Rate and Rhythm: Normal rate and regular rhythm.     Heart sounds: Normal heart sounds.  Pulmonary:     Effort: No respiratory distress.     Breath sounds: No stridor. No wheezing.  Abdominal:     General: Bowel sounds are normal. There is no distension.     Palpations: Abdomen is soft. There is no mass.     Tenderness: There is no abdominal tenderness. There is no guarding or rebound.  Musculoskeletal:        General: Tenderness present.     Cervical back: Normal range of motion and neck supple.     Right lower leg: Edema present.     Left lower leg: Edema present.  Lymphadenopathy:     Cervical: No cervical adenopathy.  Skin:    Findings: Erythema and lesion present. No rash.  Neurological:     Mental Status: She is oriented to person, place, and time.     Cranial Nerves: No cranial nerve deficit.     Motor: Weakness present. No abnormal muscle tone.     Coordination: Coordination abnormal.     Gait: Gait abnormal.     Deep Tendon Reflexes:  Reflexes normal.  Psychiatric:        Behavior: Behavior normal.        Thought Content: Thought content normal.        Judgment: Judgment normal.    In a w/c LE w/swelling B w/mild erythema Small ulcers on  RLEx3 and on LLE x1  Lab Results  Component Value Date   WBC 6.3 12/14/2019   HGB 12.0 12/14/2019   HCT 39.3 12/14/2019   PLT 199 12/14/2019   GLUCOSE 165 (H) 12/14/2019   CHOL 155 03/24/2018   TRIG 91 03/24/2018   HDL 64 03/24/2018   LDLDIRECT 89.3 06/13/2006   LDLCALC 73 03/24/2018   ALT 23 12/14/2019   AST 25 12/14/2019   NA 139 12/14/2019   K 3.3 (L) 12/14/2019   CL 98 12/14/2019   CREATININE 2.01 (H) 12/14/2019   BUN 60 (H) 12/14/2019   CO2 31 12/14/2019   TSH 1.645 12/19/2018   INR 4.8 (A) 12/26/2019   HGBA1C 9.0 (H) 10/02/2019   MICROALBUR 317.3 (H) 07/02/2016    No results found.  Assessment & Plan:    Walker Kehr, MD

## 2019-12-31 NOTE — Assessment & Plan Note (Signed)
On O2 

## 2019-12-31 NOTE — Assessment & Plan Note (Signed)
Discussed The swelling is better but not normal yet... Torsemide was changed to 200 mg/d by Nephrology

## 2019-12-31 NOTE — Assessment & Plan Note (Signed)
Titrate Toujeo by 1 u/d for goal CBGs 130-150

## 2020-01-01 ENCOUNTER — Other Ambulatory Visit: Payer: Self-pay

## 2020-01-01 NOTE — Patient Outreach (Signed)
[place] [facility]) Care Management  01/01/2020  [name] Dec 04, 1945 [id number]   Telephone assessment:  Reviewed MD office notes from yesterday.  Placed call to patient who reports she is ok. Reports weight of 155 and CBG today of 427.  Yesterday's CBG of 320.  Reviewed with patient the recommendation from MD to increase insulin 1 unit per day until fasting CBG of 150. Patient reports to me that he does not understand. I reviewed in great detail.  Today patient took 77 unit and I reviewed with patient to take 42 units tomorrow if fasting CBG is greater than 150.  She reports her legs are better Continues to have swelling. Reports her legs get weak.   PLAN: reviewed mediation adjustments per MD.  Will follow up in 1 week.  Tomasa Rand, RN, BSN, CEN [facility] [employer] [phone]

## 2020-01-02 ENCOUNTER — Other Ambulatory Visit: Payer: Self-pay

## 2020-01-02 ENCOUNTER — Ambulatory Visit
Admission: RE | Admit: 2020-01-02 | Discharge: 2020-01-02 | Disposition: A | Discharge status: Home / Self Care | Payer: Medicare Other | Source: Ambulatory Visit | Attending: Radiation Oncology | Admitting: Radiation Oncology

## 2020-01-02 ENCOUNTER — Ambulatory Visit (INDEPENDENT_AMBULATORY_CARE_PROVIDER_SITE_OTHER): Payer: Medicare Other | Admitting: Cardiovascular Disease

## 2020-01-02 DIAGNOSIS — Z5181 Encounter for therapeutic drug level monitoring: Secondary | ICD-10-CM

## 2020-01-02 DIAGNOSIS — C7802 Secondary malignant neoplasm of left lung: Secondary | ICD-10-CM | POA: Diagnosis not present

## 2020-01-02 DIAGNOSIS — Z510 Encounter for antineoplastic radiation therapy: Secondary | ICD-10-CM | POA: Diagnosis present

## 2020-01-02 DIAGNOSIS — E1122 Type 2 diabetes mellitus with diabetic chronic kidney disease: Secondary | ICD-10-CM | POA: Diagnosis not present

## 2020-01-02 DIAGNOSIS — I5033 Acute on chronic diastolic (congestive) heart failure: Secondary | ICD-10-CM | POA: Diagnosis not present

## 2020-01-02 DIAGNOSIS — I2510 Atherosclerotic heart disease of native coronary artery without angina pectoris: Secondary | ICD-10-CM | POA: Diagnosis not present

## 2020-01-02 DIAGNOSIS — I480 Paroxysmal atrial fibrillation: Secondary | ICD-10-CM

## 2020-01-02 DIAGNOSIS — C3411 Malignant neoplasm of upper lobe, right bronchus or lung: Secondary | ICD-10-CM | POA: Diagnosis not present

## 2020-01-02 DIAGNOSIS — I152 Hypertension secondary to endocrine disorders: Secondary | ICD-10-CM | POA: Diagnosis not present

## 2020-01-02 DIAGNOSIS — R911 Solitary pulmonary nodule: Secondary | ICD-10-CM | POA: Diagnosis present

## 2020-01-02 DIAGNOSIS — Z9981 Dependence on supplemental oxygen: Secondary | ICD-10-CM | POA: Diagnosis not present

## 2020-01-02 DIAGNOSIS — I4819 Other persistent atrial fibrillation: Secondary | ICD-10-CM | POA: Diagnosis not present

## 2020-01-02 DIAGNOSIS — Z923 Personal history of irradiation: Secondary | ICD-10-CM | POA: Diagnosis not present

## 2020-01-02 LAB — POCT INR: INR: 2.8 (ref 2.0–3.0)

## 2020-01-02 NOTE — Patient Instructions (Signed)
Description   Patient taking Glucerna/Ensure every day; started about 2 weeks ago* Dose discussed with and order given to [place]

## 2020-01-06 DIAGNOSIS — Z510 Encounter for antineoplastic radiation therapy: Secondary | ICD-10-CM | POA: Diagnosis not present

## 2020-01-06 DIAGNOSIS — C7802 Secondary malignant neoplasm of left lung: Secondary | ICD-10-CM | POA: Diagnosis not present

## 2020-01-08 ENCOUNTER — Other Ambulatory Visit: Payer: Self-pay

## 2020-01-09 NOTE — Patient Outreach (Signed)
[name]) Care Management  01/09/2020  [name] [birth year] [id number]   Telephone assessment:  Placed call to patient to follow up on DM and CHF.  No answer. Left a message requesting a call back.  PLAN: will call back in 3 day.  [name] Rand, RN, BSN, CEN [facility] [employer] [phone]

## 2020-01-10 ENCOUNTER — Ambulatory Visit
Admission: RE | Admit: 2020-01-10 | Discharge: 2020-01-10 | Disposition: A | Discharge status: Home / Self Care | Payer: Medicare Other | Source: Ambulatory Visit | Attending: Radiation Oncology | Admitting: Radiation Oncology

## 2020-01-10 DIAGNOSIS — Z510 Encounter for antineoplastic radiation therapy: Secondary | ICD-10-CM | POA: Diagnosis not present

## 2020-01-11 ENCOUNTER — Ambulatory Visit: Payer: Medicare Other | Admitting: Radiation Oncology

## 2020-01-11 ENCOUNTER — Other Ambulatory Visit: Payer: Self-pay

## 2020-01-11 NOTE — Patient Outreach (Signed)
[facility]) Care Management  01/11/2020  Krysia Zahradnik 05-06-1946 [id number]   Telephone assessment: Placed call to patient for follow up on DM and CHF. No answer. Left a message requesting a call back.  PLAN: will end unsuccessful outreach letter and call back in 3 business days.   Tomasa Rand, RN, BSN, CEN [facility] [employer] [phone]

## 2020-01-12 DIAGNOSIS — E1122 Type 2 diabetes mellitus with diabetic chronic kidney disease: Secondary | ICD-10-CM | POA: Diagnosis not present

## 2020-01-12 DIAGNOSIS — C3411 Malignant neoplasm of upper lobe, right bronchus or lung: Secondary | ICD-10-CM | POA: Diagnosis not present

## 2020-01-12 DIAGNOSIS — I5033 Acute on chronic diastolic (congestive) heart failure: Secondary | ICD-10-CM | POA: Diagnosis not present

## 2020-01-12 DIAGNOSIS — I2510 Atherosclerotic heart disease of native coronary artery without angina pectoris: Secondary | ICD-10-CM | POA: Diagnosis not present

## 2020-01-12 DIAGNOSIS — I4819 Other persistent atrial fibrillation: Secondary | ICD-10-CM | POA: Diagnosis not present

## 2020-01-12 DIAGNOSIS — I152 Hypertension secondary to endocrine disorders: Secondary | ICD-10-CM | POA: Diagnosis not present

## 2020-01-14 ENCOUNTER — Ambulatory Visit: Payer: Medicare Other | Admitting: Radiation Oncology

## 2020-01-14 NOTE — Progress Notes (Signed)
  Radiation Oncology         [phone] ________________________________  Name: [name] MRN: [id number]  Date: 01/15/2020  DOB: Dec 28, 1945  Stereotactic Body Radiotherapy Treatment Procedure Note  NARRATIVE:  [name] was brought to the stereotactic radiation treatment machine and placed supine on the CT couch. The patient was set up for stereotactic body radiotherapy on the body fix pillow.  3D TREATMENT PLANNING AND DOSIMETRY:  The patient's radiation plan was reviewed and approved prior to starting treatment.  It showed 3-dimensional radiation distributions overlaid onto the planning CT.  The [facility] for the target structures as well as the organs at risk were reviewed. The documentation of this is filed in the radiation oncology EMR.  SIMULATION VERIFICATION:  The patient underwent CT imaging on the treatment unit.  These were carefully aligned to document that the ablative radiation dose would cover the target volume and maximally spare the nearby organs at risk according to the planned distribution.  SPECIAL TREATMENT PROCEDURE: [facility] received high dose ablative stereotactic body radiotherapy to the planned target volume without unforeseen complications. Treatment was delivered uneventfully. The high doses associated with stereotactic body radiotherapy and the significant potential risks require careful treatment set up and patient monitoring constituting a special treatment procedure   STEREOTACTIC TREATMENT MANAGEMENT:  Following delivery, the patient was evaluated clinically. The patient tolerated treatment without significant acute effects, and was discharged to home in stable condition.    PLAN: Continue treatment as planned.  ________________________________  Blair Promise, PhD, MD  This document serves as a record of services personally performed by Gery Pray, MD. It was created on his behalf by Clerance Lav, a trained medical scribe. The creation of this  record is based on the scribe's personal observations and the provider's statements to them. This document has been checked and approved by the attending provider.

## 2020-01-15 ENCOUNTER — Ambulatory Visit
Admission: RE | Admit: 2020-01-15 | Discharge: 2020-01-15 | Disposition: A | Discharge status: Home / Self Care | Payer: Medicare Other | Source: Ambulatory Visit | Attending: Radiation Oncology | Admitting: Radiation Oncology

## 2020-01-15 ENCOUNTER — Other Ambulatory Visit: Payer: Self-pay

## 2020-01-15 DIAGNOSIS — R911 Solitary pulmonary nodule: Secondary | ICD-10-CM

## 2020-01-15 DIAGNOSIS — Z510 Encounter for antineoplastic radiation therapy: Secondary | ICD-10-CM | POA: Diagnosis not present

## 2020-01-16 ENCOUNTER — Ambulatory Visit (INDEPENDENT_AMBULATORY_CARE_PROVIDER_SITE_OTHER): Payer: Medicare Other | Admitting: Pharmacist Clinician (PhC)/ Clinical Pharmacy Specialist

## 2020-01-16 ENCOUNTER — Other Ambulatory Visit: Payer: Self-pay

## 2020-01-16 DIAGNOSIS — Z7901 Long term (current) use of anticoagulants: Secondary | ICD-10-CM

## 2020-01-16 DIAGNOSIS — K8020 Calculus of gallbladder without cholecystitis without obstruction: Secondary | ICD-10-CM | POA: Diagnosis not present

## 2020-01-16 DIAGNOSIS — Z794 Long term (current) use of insulin: Secondary | ICD-10-CM | POA: Diagnosis not present

## 2020-01-16 DIAGNOSIS — Z5181 Encounter for therapeutic drug level monitoring: Secondary | ICD-10-CM

## 2020-01-16 DIAGNOSIS — I2510 Atherosclerotic heart disease of native coronary artery without angina pectoris: Secondary | ICD-10-CM | POA: Diagnosis not present

## 2020-01-16 DIAGNOSIS — Z952 Presence of prosthetic heart valve: Secondary | ICD-10-CM | POA: Diagnosis not present

## 2020-01-16 DIAGNOSIS — Z87891 Personal history of nicotine dependence: Secondary | ICD-10-CM | POA: Diagnosis not present

## 2020-01-16 DIAGNOSIS — E785 Hyperlipidemia, unspecified: Secondary | ICD-10-CM | POA: Diagnosis not present

## 2020-01-16 DIAGNOSIS — I480 Paroxysmal atrial fibrillation: Secondary | ICD-10-CM | POA: Diagnosis not present

## 2020-01-16 DIAGNOSIS — Z992 Dependence on renal dialysis: Secondary | ICD-10-CM | POA: Diagnosis not present

## 2020-01-16 DIAGNOSIS — J9611 Chronic respiratory failure with hypoxia: Secondary | ICD-10-CM | POA: Diagnosis not present

## 2020-01-16 DIAGNOSIS — H9190 Unspecified hearing loss, unspecified ear: Secondary | ICD-10-CM | POA: Diagnosis not present

## 2020-01-16 DIAGNOSIS — I152 Hypertension secondary to endocrine disorders: Secondary | ICD-10-CM | POA: Diagnosis not present

## 2020-01-16 DIAGNOSIS — I4819 Other persistent atrial fibrillation: Secondary | ICD-10-CM | POA: Diagnosis not present

## 2020-01-16 DIAGNOSIS — Z87440 Personal history of urinary (tract) infections: Secondary | ICD-10-CM | POA: Diagnosis not present

## 2020-01-16 DIAGNOSIS — E1122 Type 2 diabetes mellitus with diabetic chronic kidney disease: Secondary | ICD-10-CM | POA: Diagnosis not present

## 2020-01-16 DIAGNOSIS — C3411 Malignant neoplasm of upper lobe, right bronchus or lung: Secondary | ICD-10-CM | POA: Diagnosis not present

## 2020-01-16 DIAGNOSIS — K2100 Gastro-esophageal reflux disease with esophagitis, without bleeding: Secondary | ICD-10-CM | POA: Diagnosis not present

## 2020-01-16 DIAGNOSIS — M1030 Gout due to renal impairment, unspecified site: Secondary | ICD-10-CM | POA: Diagnosis not present

## 2020-01-16 DIAGNOSIS — D509 Iron deficiency anemia, unspecified: Secondary | ICD-10-CM | POA: Diagnosis not present

## 2020-01-16 DIAGNOSIS — I5033 Acute on chronic diastolic (congestive) heart failure: Secondary | ICD-10-CM | POA: Diagnosis not present

## 2020-01-16 DIAGNOSIS — K5730 Diverticulosis of large intestine without perforation or abscess without bleeding: Secondary | ICD-10-CM | POA: Diagnosis not present

## 2020-01-16 DIAGNOSIS — Z940 Kidney transplant status: Secondary | ICD-10-CM | POA: Diagnosis not present

## 2020-01-16 DIAGNOSIS — G4730 Sleep apnea, unspecified: Secondary | ICD-10-CM | POA: Diagnosis not present

## 2020-01-16 DIAGNOSIS — N186 End stage renal disease: Secondary | ICD-10-CM | POA: Diagnosis not present

## 2020-01-16 DIAGNOSIS — J449 Chronic obstructive pulmonary disease, unspecified: Secondary | ICD-10-CM | POA: Diagnosis not present

## 2020-01-16 LAB — POCT INR: INR: 4.7 — AB (ref 2.0–3.0)

## 2020-01-16 NOTE — Patient Outreach (Signed)
[facility]) Care Management  01/16/2020  [name] January 25, 1946 [id number]   Successful outreach to patient after multiple attempts. Patient reports that she is doing well. Reports weight up about 3 pounds with some slight swelling. Reports CBG today of 220.  Reports she took 41 unit of insulin today. Reports that she continues to be mixed up about her insulin doses. Reviewed with patient again how to take insulin and how to increase her doses. Patient wrote it down while we were on the phone.  Reviewed with patient when to call MD. She voiced understanding.  PLAN: follow up in 10 days.  Tomasa Rand, RN, BSN, CEN [facility] [employer] [phone]

## 2020-01-16 NOTE — Progress Notes (Signed)
°  Radiation Oncology         [phone] ________________________________  Name: [name] MRN: [id number]  Date: 01/17/2020  DOB: 1946/04/08  Stereotactic Body Radiotherapy Treatment Procedure Note  NARRATIVE:  Shane Melby was brought to the stereotactic radiation treatment machine and placed supine on the CT couch. The patient was set up for stereotactic body radiotherapy on the body fix pillow.  3D TREATMENT PLANNING AND DOSIMETRY:  The patient's radiation plan was reviewed and approved prior to starting treatment.  It showed 3-dimensional radiation distributions overlaid onto the planning CT.  The [facility] for the target structures as well as the organs at risk were reviewed. The documentation of this is filed in the radiation oncology EMR.  SIMULATION VERIFICATION:  The patient underwent CT imaging on the treatment unit.  These were carefully aligned to document that the ablative radiation dose would cover the target volume and maximally spare the nearby organs at risk according to the planned distribution.  SPECIAL TREATMENT PROCEDURE: [facility] received high dose ablative stereotactic body radiotherapy to the planned target volume without unforeseen complications. Treatment was delivered uneventfully. The high doses associated with stereotactic body radiotherapy and the significant potential risks require careful treatment set up and patient monitoring constituting a special treatment procedure   STEREOTACTIC TREATMENT MANAGEMENT:  Following delivery, the patient was evaluated clinically. The patient tolerated treatment without significant acute effects, and was discharged to home in stable condition.    PLAN: Continue treatment as planned.  ________________________________  Blair Promise, PhD, MD  This document serves as a record of services personally performed by Gery Pray, MD. It was created on his behalf by Clerance Lav, a trained medical scribe. The creation of this  record is based on the scribe's personal observations and the provider's statements to them. This document has been checked and approved by the attending provider.

## 2020-01-17 ENCOUNTER — Other Ambulatory Visit: Payer: Self-pay

## 2020-01-17 ENCOUNTER — Ambulatory Visit
Admission: RE | Admit: 2020-01-17 | Discharge: 2020-01-17 | Disposition: A | Discharge status: Home / Self Care | Payer: Medicare Other | Source: Ambulatory Visit | Attending: Radiation Oncology | Admitting: Radiation Oncology

## 2020-01-17 ENCOUNTER — Encounter: Payer: Self-pay | Admitting: Radiation Oncology

## 2020-01-17 DIAGNOSIS — Z510 Encounter for antineoplastic radiation therapy: Secondary | ICD-10-CM | POA: Diagnosis not present

## 2020-01-17 DIAGNOSIS — Z9981 Dependence on supplemental oxygen: Secondary | ICD-10-CM | POA: Diagnosis not present

## 2020-01-17 DIAGNOSIS — C3411 Malignant neoplasm of upper lobe, right bronchus or lung: Secondary | ICD-10-CM | POA: Diagnosis not present

## 2020-01-17 DIAGNOSIS — R911 Solitary pulmonary nodule: Secondary | ICD-10-CM | POA: Diagnosis not present

## 2020-01-17 DIAGNOSIS — Z923 Personal history of irradiation: Secondary | ICD-10-CM | POA: Insufficient documentation

## 2020-01-17 DIAGNOSIS — C7802 Secondary malignant neoplasm of left lung: Secondary | ICD-10-CM | POA: Diagnosis not present

## 2020-01-23 ENCOUNTER — Ambulatory Visit (INDEPENDENT_AMBULATORY_CARE_PROVIDER_SITE_OTHER): Payer: Medicare Other | Admitting: Cardiology

## 2020-01-23 DIAGNOSIS — I5033 Acute on chronic diastolic (congestive) heart failure: Secondary | ICD-10-CM | POA: Diagnosis not present

## 2020-01-23 DIAGNOSIS — C3411 Malignant neoplasm of upper lobe, right bronchus or lung: Secondary | ICD-10-CM | POA: Diagnosis not present

## 2020-01-23 DIAGNOSIS — I4819 Other persistent atrial fibrillation: Secondary | ICD-10-CM | POA: Diagnosis not present

## 2020-01-23 DIAGNOSIS — I152 Hypertension secondary to endocrine disorders: Secondary | ICD-10-CM | POA: Diagnosis not present

## 2020-01-23 DIAGNOSIS — E1122 Type 2 diabetes mellitus with diabetic chronic kidney disease: Secondary | ICD-10-CM | POA: Diagnosis not present

## 2020-01-23 DIAGNOSIS — I2510 Atherosclerotic heart disease of native coronary artery without angina pectoris: Secondary | ICD-10-CM | POA: Diagnosis not present

## 2020-01-23 DIAGNOSIS — Z5181 Encounter for therapeutic drug level monitoring: Secondary | ICD-10-CM | POA: Diagnosis not present

## 2020-01-23 DIAGNOSIS — I480 Paroxysmal atrial fibrillation: Secondary | ICD-10-CM | POA: Diagnosis not present

## 2020-01-23 LAB — POCT INR: INR: 2.3 (ref 2.0–3.0)

## 2020-01-24 ENCOUNTER — Other Ambulatory Visit: Payer: Self-pay

## 2020-01-24 ENCOUNTER — Other Ambulatory Visit: Payer: Self-pay | Admitting: Internal Medicine

## 2020-01-25 NOTE — Patient Outreach (Signed)
[facility]) Care Management  01/25/2020  Nakyra Bourn Apr 18, 1946 [id number]   Telephone assessments:  Placed call to patient today with no answer. Left a message on voicemail and requested a call back.  PLAN:  Will wait for a call back or will reattempt in 3 days.   Tomasa Rand, RN, BSN, CEN [facility] [employer] [phone]

## 2020-01-30 ENCOUNTER — Telehealth: Payer: Self-pay

## 2020-01-30 ENCOUNTER — Other Ambulatory Visit: Payer: Self-pay

## 2020-01-30 DIAGNOSIS — I152 Hypertension secondary to endocrine disorders: Secondary | ICD-10-CM | POA: Diagnosis not present

## 2020-01-30 DIAGNOSIS — I4819 Other persistent atrial fibrillation: Secondary | ICD-10-CM | POA: Diagnosis not present

## 2020-01-30 DIAGNOSIS — E1122 Type 2 diabetes mellitus with diabetic chronic kidney disease: Secondary | ICD-10-CM | POA: Diagnosis not present

## 2020-01-30 DIAGNOSIS — I5033 Acute on chronic diastolic (congestive) heart failure: Secondary | ICD-10-CM | POA: Diagnosis not present

## 2020-01-30 DIAGNOSIS — I2510 Atherosclerotic heart disease of native coronary artery without angina pectoris: Secondary | ICD-10-CM | POA: Diagnosis not present

## 2020-01-30 DIAGNOSIS — C3411 Malignant neoplasm of upper lobe, right bronchus or lung: Secondary | ICD-10-CM | POA: Diagnosis not present

## 2020-01-30 NOTE — Progress Notes (Incomplete)
  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 She did report a change in taste. However, she denied all other symptoms.  Plan: The patient will follow-up with radiation oncology in one month.  ________________________________________________   Blair Promise, PhD, MD  This document serves as a record of services personally performed by Gery Pray, MD. It was created on his behalf by Clerance Lav, a trained medical scribe. The creation of this record is based on the scribe's personal observations and the provider's statements to them. This document has been checked and approved by the attending provider.

## 2020-01-30 NOTE — Telephone Encounter (Addendum)
[name] with [facility] calling and states that she is there with the patient. States that the patient has some cellulitis to bilateral extremities. States that there are open areas to both legs. Patient has been wrapping towels around her legs to catch the weeping fluids.   States that she was calling for orders, but would prefer the patient come in to be seen. Patient scheduled with Dr Ronnald Ramp on 01/31/2020 at 2:40pm.

## 2020-01-31 ENCOUNTER — Ambulatory Visit (INDEPENDENT_AMBULATORY_CARE_PROVIDER_SITE_OTHER): Payer: Medicare Other | Admitting: Internal Medicine

## 2020-01-31 ENCOUNTER — Encounter: Payer: Self-pay | Admitting: Internal Medicine

## 2020-01-31 ENCOUNTER — Other Ambulatory Visit: Payer: Self-pay

## 2020-01-31 VITALS — BP 156/60 | HR 80 | Temp 98.6°F | Resp 16 | Ht 60.0 in | Wt 163.0 lb

## 2020-01-31 DIAGNOSIS — I872 Venous insufficiency (chronic) (peripheral): Secondary | ICD-10-CM

## 2020-01-31 DIAGNOSIS — E1121 Type 2 diabetes mellitus with diabetic nephropathy: Secondary | ICD-10-CM | POA: Diagnosis not present

## 2020-01-31 DIAGNOSIS — I739 Peripheral vascular disease, unspecified: Secondary | ICD-10-CM | POA: Diagnosis not present

## 2020-01-31 DIAGNOSIS — L97919 Non-pressure chronic ulcer of unspecified part of right lower leg with unspecified severity: Secondary | ICD-10-CM | POA: Diagnosis not present

## 2020-01-31 DIAGNOSIS — E1140 Type 2 diabetes mellitus with diabetic neuropathy, unspecified: Secondary | ICD-10-CM | POA: Diagnosis not present

## 2020-01-31 DIAGNOSIS — I151 Hypertension secondary to other renal disorders: Secondary | ICD-10-CM | POA: Diagnosis not present

## 2020-01-31 DIAGNOSIS — I83029 Varicose veins of left lower extremity with ulcer of unspecified site: Secondary | ICD-10-CM

## 2020-01-31 DIAGNOSIS — E042 Nontoxic multinodular goiter: Secondary | ICD-10-CM

## 2020-01-31 DIAGNOSIS — I2510 Atherosclerotic heart disease of native coronary artery without angina pectoris: Secondary | ICD-10-CM

## 2020-01-31 DIAGNOSIS — L97929 Non-pressure chronic ulcer of unspecified part of left lower leg with unspecified severity: Secondary | ICD-10-CM | POA: Diagnosis not present

## 2020-01-31 DIAGNOSIS — N186 End stage renal disease: Secondary | ICD-10-CM | POA: Diagnosis not present

## 2020-01-31 DIAGNOSIS — Z794 Long term (current) use of insulin: Secondary | ICD-10-CM | POA: Diagnosis not present

## 2020-01-31 DIAGNOSIS — N2889 Other specified disorders of kidney and ureter: Secondary | ICD-10-CM | POA: Diagnosis not present

## 2020-01-31 DIAGNOSIS — I83019 Varicose veins of right lower extremity with ulcer of unspecified site: Secondary | ICD-10-CM | POA: Diagnosis not present

## 2020-01-31 MED ORDER — CLOBETASOL PROPIONATE 0.05 % EX OINT: 1.0000 "application " | TOPICAL_OINTMENT | Freq: Two times a day (BID) | CUTANEOUS | 1 refills | Status: DC

## 2020-01-31 MED ORDER — OXYCODONE-ACETAMINOPHEN 5-325 MG PO TABS: 1.0000 | ORAL_TABLET | Freq: Three times a day (TID) | ORAL | 0 refills | Status: DC | PRN

## 2020-01-31 NOTE — Progress Notes (Signed)
Subjective:  [name], female    DOB: [birth year]  Age: 74 y.o. MRN: [id number]  CC: Diabetes  This visit occurred during the SARS-CoV-2 public health emergency.  Safety protocols were in place, including screening questions prior to the visit, additional usage of staff PPE, and extensive cleaning of exam room while observing appropriate contact time as indicated for disinfecting solutions.   NEW TO ME  HPI [name] presents for follow-up- It is difficult to get a history from her but it sounds like for greater than a month she has had worsening pain, redness, swelling, ulcers, and weeping from both lower extremities.  She was treated with Keflex about 4 weeks ago and says it has not helped.  She has applied Neosporin and triamcinolone to the area with no improvement. She complains of burning, stabbing pain on the plantar surfaces of both feet.  She requests a refill of Percocet.  She does not monitor her blood sugar but denies polys.  Past Medical History:  Diagnosis Date   Acute CHF (congestive heart failure) ([place]) 12/2018   Adenocarcinoma of lung, stage 1, right ([place]) 11/25/2017   Anemia, iron deficiency    of chronic disease   Aortic stenosis    a. Severe AS by echo 11/2012.   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 During ENT surgery 2005: developed marked asystole requiring CPR, felt due to vagal reaction (cath nonobst dz).   Carotid artery disease ([place])    a. Carotid Dopplers performed in August 2013 showed 40-59% left stenosis and 0-39% right; f/u recommended in 2 years.    Cerebrovascular accident [facility]) 2009   a. LMCA infarct felt embolic 3559, maintained on chronic coumadin.; denies residual on 04/05/2013   Cholelithiasis    Chronic Persistent Atrial Fibrillation 12/31/2008   Qualifier: Diagnosis of  By: [name]     Coronary artery disease 05/2002   a. Ant MI 2003 s/p PTCA/stent to RCA.     Diverticulosis of colon    Esophagitis, reflux    ESRD (end stage renal disease) ([place])    a. Mass on L kidney per pt s/p nephrectomy - pt states not cancer - [facility] notes indicate ESRD due to HTN/DM - was previously on HD. b. Kidney transplant 02/2011.   GERD (gastroesophageal reflux disease)    Gout    Hearing loss    Helicobacter pylori (H. pylori) infection    hx of   Hemorrhoids    Hx of colonic polyps    adenomatous   Hyperlipidemia    Hypertension    Lung nodule seen on imaging study 04/07/2013   1.0 cm ground glass opacity RUL   Myocardial infarction [name]) 2003   Pericardial effusion    a. Small by echo 11/2011.   S/P aortic valve replacement with bioprosthetic valve and maze procedure 04/12/2013   59mm [facility] Ease bovine pericardial tissue valve    S/P Maze operation for atrial fibrillation 04/12/2013   Complete bilateral atrial lesion set using bipolar radiofrequency and cryothermy ablation with clipping of LA appendage   Sleep apnea    Pt says testing was positive, intolerant of CPAP.   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   Stroke [facility])    Type II diabetes mellitus ([place])  Past Surgical History:  Procedure Laterality Date   AORTIC VALVE REPLACEMENT N/A 04/12/2013   Procedure: AORTIC VALVE REPLACEMENT (AVR);  Surgeon: Rexene Alberts, MD;  Location: [place];  Service: Open Heart Surgery;  Laterality: N/A;   ARTERIOVENOUS GRAFT PLACEMENT Left    ARTERIOVENOUS GRAFT PLACEMENT Left    "I've had 2 on my left; had one removed" (04/05/2013)    ARTERY EXPLORATION Right 04/11/2013   Procedure: ARTERY EXPLORATION;  Surgeon: Rexene Alberts, MD;  Location: [place];  Service: Open Heart Surgery;  Laterality: Right;  Right carotid artery exploration   AV FISTULA PLACEMENT Right    AV FISTULA REPAIR  Right    "took it out" ((/18/2014)   CARDIOVERSION  05/29/2012   Procedure: CARDIOVERSION;  Surgeon: Lelon Perla, MD;  Location: [facility] ENDOSCOPY;  Service: Cardiovascular;  Laterality: N/A;   CHOLECYSTECTOMY  2009   with hernia removal   CORONARY ANGIOPLASTY WITH STENT PLACEMENT Right    coronary artery   INSERTION OF DIALYSIS CATHETER Bilateral    "over the years; took them both out" (04/05/2013)   INTRAOPERATIVE TRANSESOPHAGEAL ECHOCARDIOGRAM N/A 04/11/2013   Procedure: INTRAOPERATIVE TRANSESOPHAGEAL ECHOCARDIOGRAM;  Surgeon: Rexene Alberts, MD;  Location: [place];  Service: Open Heart Surgery;  Laterality: N/A;   INTRAOPERATIVE TRANSESOPHAGEAL ECHOCARDIOGRAM N/A 04/12/2013   Procedure: INTRAOPERATIVE TRANSESOPHAGEAL ECHOCARDIOGRAM;  Surgeon: Rexene Alberts, MD;  Location: [place];  Service: Open Heart Surgery;  Laterality: N/A;   KIDNEY TRANSPLANT  03/16/11   LEFT AND RIGHT HEART CATHETERIZATION WITH CORONARY ANGIOGRAM N/A 04/06/2013   Procedure: LEFT AND RIGHT HEART CATHETERIZATION WITH CORONARY ANGIOGRAM;  Surgeon: Blane Ohara, MD;  Location: [facility] CATH LAB;  Service: Cardiovascular;  Laterality: N/A;   MAZE N/A 04/12/2013   Procedure: MAZE;  Surgeon: Rexene Alberts, MD;  Location: [place];  Service: Open Heart Surgery;  Laterality: N/A;   NASAL RECONSTRUCTION WITH SEPTAL REPAIR     "took it out" (04/05/2013)   NEPHRECTOMY Left 2010   no CA on bx   TONSILLECTOMY     TOTAL ABDOMINAL HYSTERECTOMY     TUBAL LIGATION      reports that she quit smoking about 18 years ago. Her smoking use included cigarettes. She has a 30.00 pack-year smoking history. She has never used smokeless tobacco. She reports that she does not drink alcohol and does not use drugs. family history includes Crohn's disease in an other family member; Diabetes in her maternal grandmother, son, and another family member; Hypertension in her mother; Stroke in her father. Allergies  Allergen Reactions    Ibuprofen Nausea And Vomiting   Sulfamethoxazole-Trimethoprim Itching, Swelling and Rash    Swelling of the face   Sulfonamide Derivatives Itching, Swelling and Rash    Swelling of the face   Tape Rash    Paper tape is ok   Tramadol Nausea And Vomiting   Doxycycline Nausea Only   Hydrocil [Psyllium] Nausea And Vomiting   Bactrim Itching, Swelling and Rash   Red Dye Itching and Rash    Outpatient Medications Prior to Visit  Medication Sig Dispense Refill   acetaminophen (TYLENOL) 500 MG tablet Take 500 mg by mouth every 6 (six) hours as needed for mild pain or headache.     dapsone 25 MG tablet Take 25 mg by mouth daily.      gabapentin (NEURONTIN) 300 MG capsule Take 1 capsule (300 mg total) by mouth 3 (three) times daily. 90 capsule 5   Insulin Pen Needle (  B-D UF III MINI PEN NEEDLES) 31G X 5 MM MISC USE TO ADMINISTER INSULIN FOUR TIMES A DAY DX E11.9 100 each 1   levothyroxine (SYNTHROID) 25 MCG tablet TAKE 2 TABLETS (50 MCG TOTAL) BY MOUTH DAILY. 180 tablet 3   metoprolol tartrate (LOPRESSOR) 25 MG tablet Take 0.5 tablets (12.5 mg total) by mouth 2 (two) times daily. 90 tablet 3   ONETOUCH DELICA LANCETS 07M MISC Use to check blood sugars three times a day DX E11.9 100 each 5   ONETOUCH VERIO test strip USE TO TEST 3 TIMES DAILY. DX E11.9 100 strip 5   OXYGEN Inhale 3 L into the lungs as needed (for shortness of breath).      predniSONE (DELTASONE) 5 MG tablet Take 5 mg by mouth daily.   5   Propylene Glycol (SYSTANE BALANCE) 0.6 % SOLN Place 1-2 drops into both eyes 3 (three) times daily as needed (for dryness).     repaglinide (PRANDIN) 0.5 MG tablet Take 0.5 mg by mouth 3 (three) times daily before meals.     rosuvastatin (CRESTOR) 20 MG tablet Take 1 tablet (20 mg total) by mouth daily. (Patient taking differently: Take 20 mg by mouth every other day. ) 90 tablet 3   tacrolimus (PROGRAF) 1 MG capsule Take 3 mg by mouth 2 (two) times daily.      torsemide  (DEMADEX) 100 MG tablet Take 2 tablets (200 mg total) by mouth daily. 60 tablet 5   triamcinolone cream (KENALOG) 0.1 % Apply 1 application topically 4 (four) times daily. 80 g 1   warfarin (COUMADIN) 5 MG tablet TAKE 1/2 TO 1 TABLET BY MOUTH DAILY AS DIRECTED BY COUMADIN CLINIC 90 tablet 1   insulin glargine, 1 Unit Dial, (TOUJEO SOLOSTAR) 300 UNIT/ML Solostar Pen Inject 34 Units into the skin every morning. Titrate up by 1 unit a day if needed for goal sugars of 100-130 up to 50 units a day max 3 pen 11   oxyCODONE-acetaminophen (PERCOCET/ROXICET) 5-325 MG tablet Take 1 tablet by mouth every 8 (eight) hours as needed for severe pain. 60 tablet 0   amoxicillin (AMOXIL) 500 MG capsule TAKE 4 CAPSULES BY MOUTH 1 HOUR PRIOR TO DENTAL WORK (Patient not taking: Reported on 01/31/2020)     potassium chloride SA (KLOR-CON M20) 20 MEQ tablet Take 1 tablet (20 mEq total) by mouth daily. (Patient taking differently: Take 40 mEq by mouth daily. ) 30 tablet 3   cephALEXin (KEFLEX) 500 MG capsule Take 1 capsule (500 mg total) by mouth 4 (four) times daily. 40 capsule 0   No facility-administered medications prior to visit.    ROS Review of Systems  Constitutional: Negative for appetite change, diaphoresis, fatigue and unexpected weight change.  HENT: Negative.   Respiratory: Positive for shortness of breath. Negative for cough, chest tightness and wheezing.   Cardiovascular: Positive for leg swelling. Negative for chest pain and palpitations.  Gastrointestinal: Negative for abdominal pain, constipation, diarrhea, nausea and vomiting.  Endocrine: Negative for polydipsia, polyphagia and polyuria.  Genitourinary: Negative.  Negative for difficulty urinating, dysuria, frequency and hematuria.  Musculoskeletal: Negative for arthralgias and myalgias.  Skin: Positive for color change, rash and wound. Negative for pallor.  Neurological: Negative for dizziness, weakness and light-headedness.  Hematological:  Negative for adenopathy. Does not bruise/bleed easily.    Objective:  BP (!) 156/60 (BP Location: Left Arm, Patient Position: Sitting, Cuff Size: Large)    Pulse 80    Temp 98.6 F (37 C) (  Oral)    Resp 16    Ht 5' (1.524 m)    Wt 163 lb (73.9 kg)    SpO2 94%    BMI 31.83 kg/m   BP Readings from Last 3 Encounters:  01/31/20 (!) 156/60  12/31/19 (!) 188/82  12/31/19 (!) 188/82    Wt Readings from Last 3 Encounters:  01/31/20 163 lb (73.9 kg)  01/16/20 165 lb (74.8 kg)  12/31/19 164 lb (74.4 kg)    Physical Exam Vitals reviewed.  Constitutional:      General: She is not in acute distress.    Appearance: She is ill-appearing (frail, in a wheelchair, on O2). She is not toxic-appearing or diaphoretic.  HENT:     Nose: Nose normal.  Eyes:     General: No scleral icterus.    Conjunctiva/sclera: Conjunctivae normal.  Cardiovascular:     Rate and Rhythm: Normal rate. Rhythm irregularly irregular.     Heart sounds: No murmur heard.   Pulmonary:     Effort: Pulmonary effort is normal.     Breath sounds: No stridor. No wheezing, rhonchi or rales.  Abdominal:     General: Abdomen is flat.     Palpations: There is no mass.     Tenderness: There is no abdominal tenderness. There is no guarding.  Musculoskeletal:        General: No swelling.     Cervical back: Neck supple.     Right lower leg: Edema present.     Left lower leg: Edema present.     Comments: ScatteredOver both lower extremities, slightly more prominent on the right than the left, areas of erythema, superficial ulcers, weeping of serous fluid, and edema.  See photos.  There is no warmth, induration, fluctuance, or purulence.  There is no erythematous streaking.  Lymphadenopathy:     Cervical: No cervical adenopathy.  Skin:    Findings: Erythema and rash present.  Neurological:     General: No focal deficit present.     Mental Status: She is alert.  Psychiatric:        Mood and Affect: Mood normal.         Behavior: Behavior normal.     Lab Results  Component Value Date   WBC 6.3 12/14/2019   HGB 12.0 12/14/2019   HCT 39.3 12/14/2019   PLT 199 12/14/2019   GLUCOSE 357 (H) 01/31/2020   CHOL 155 03/24/2018   TRIG 91 03/24/2018   HDL 64 03/24/2018   LDLDIRECT 89.3 06/13/2006   LDLCALC 73 03/24/2018   ALT 19 01/31/2020   AST 18 01/31/2020   NA 141 01/31/2020   K 4.2 01/31/2020   CL 100 01/31/2020   CREATININE 2.96 (H) 01/31/2020   BUN 64 (H) 01/31/2020   CO2 25 01/31/2020   TSH 2.15 01/31/2020   INR 2.3 01/23/2020   HGBA1C 12.3 (H) 01/31/2020   MICROALBUR 317.3 (H) 07/02/2016    No results found.  Assessment & Plan:   [name] was seen today for diabetes.  Diagnoses and all orders for this visit:  Venous stasis ulcers of both lower extremities ([place])- Dressings were applied to BLE.  I have asked her to be seen by the wound care clinic as soon as possible.  Her sed rate is normal which is reassuring that she does not have vasculitis.  I am also concerned about arterial flow so I ordered ABIs. -     Ambulatory referral to Wound Clinic -     Sedimentation  rate; Future -     Sedimentation rate  Venous stasis dermatitis of both lower extremities- I have asked her to stop applying Neosporin triamcinolone to the areas.  I think she would get better symptom relief with a more potent topical steroid. -     clobetasol ointment (TEMOVATE) 0.05 %; Apply 1 application topically 2 (two) times daily. -     Sedimentation rate; Future -     Sedimentation rate  Type 2 diabetes mellitus with diabetic nephropathy, with long-term current use of insulin (HCC)-her A1c is too high at 12.3%.  She has severe renal impairment.  I recommended that she increase the dose of her basal insulin and to add a quick acting insulin with each meal or for any blood sugar above 200. -     Hepatic function panel; Future -     Hemoglobin A1c; Future -     BASIC METABOLIC PANEL WITH GFR; Future -     BASIC METABOLIC  PANEL WITH GFR -     Hemoglobin A1c -     Hepatic function panel -     insulin glargine, 1 Unit Dial, (TOUJEO SOLOSTAR) 300 UNIT/ML Solostar Pen; Inject 60 Units into the skin every morning. Titrate up by 1 unit a day if needed for goal sugars of 100-130 up to 50 units a day max -     Amb Referral to Nutrition and Diabetic E -     Ambulatory referral to Endocrinology -     Consult to [facility] -     insulin lispro (HUMALOG KWIKPEN) 200 UNIT/ML KwikPen; Inject 10 Units into the skin with breakfast, with lunch, and with evening meal. -     HM Diabetes Foot Exam  Multinodular goiter- Her TFTs are normal. -     Thyroid Panel With TSH; Future -     Thyroid Panel With TSH  Hypertension secondary to other renal disorders- Considering her comorbid illnesses her blood pressure is adequately well controlled.  Claudication of both lower extremities (HCC) -     VAS [place] ABI WITH/WO TBI; Future  Diabetic neuropathy, painful ([place]) -     oxyCODONE-acetaminophen (PERCOCET/ROXICET) 5-325 MG tablet; Take 1 tablet by mouth every 8 (eight) hours as needed for severe pain.   I have discontinued Ajwa Freitas's cephALEXin. I have also changed her [employer]. Additionally, I am having her start on clobetasol ointment and HumaLOG KwikPen. Lastly, I am having her maintain her dapsone, tacrolimus, OneTouch Delica Lancets 61Y, Insulin Pen Needle, predniSONE, OXYGEN, Propylene Glycol, acetaminophen, rosuvastatin, metoprolol tartrate, potassium chloride SA, gabapentin, repaglinide, warfarin, OneTouch Verio, amoxicillin, triamcinolone cream, torsemide, levothyroxine, and oxyCODONE-acetaminophen.  Meds ordered this encounter  Medications   clobetasol ointment (TEMOVATE) 0.05 %    Sig: Apply 1 application topically 2 (two) times daily.    Dispense:  60 g    Refill:  1   oxyCODONE-acetaminophen (PERCOCET/ROXICET) 5-325 MG tablet    Sig: Take 1 tablet by mouth every 8 (eight) hours as needed for severe  pain.    Dispense:  60 tablet    Refill:  0   insulin glargine, 1 Unit Dial, (TOUJEO SOLOSTAR) 300 UNIT/ML Solostar Pen    Sig: Inject 60 Units into the skin every morning. Titrate up by 1 unit a day if needed for goal sugars of 100-130 up to 50 units a day max    Dispense:  3 pen    Refill:  3   insulin lispro (HUMALOG KWIKPEN) 200 UNIT/ML  KwikPen    Sig: Inject 10 Units into the skin with breakfast, with lunch, and with evening meal.    Dispense:  9 mL    Refill:  1   I spent 60 minutes in preparing to see the patient by review of recent labs, imaging and procedures, obtaining and reviewing separately obtained history, communicating with the patient and family or caregiver, ordering medications, tests or procedures, and documenting clinical information in the EHR including the differential Dx, treatment, and any further evaluation and other management of 1. Venous stasis ulcers of both lower extremities (HCC) 2. Venous stasis dermatitis of both lower extremities 3. Type 2 diabetes mellitus with diabetic nephropathy, with long-term current use of insulin ([place]) 4. Multinodular goiter 5. Hypertension secondary to other renal disorders 6. Claudication of both lower extremities ([place]) 7. Diabetic neuropathy, painful ([place])     Follow-up: Return in about 4 weeks (around 02/28/2020).  Scarlette Calico, MD

## 2020-01-31 NOTE — Patient Outreach (Signed)
[facility]) Care Management  01/31/2020  [name] 25-Oct-1945 [id number]   Telephone assessment on 01/30/2020  Placed call to patient who reports that she is having pain in her legs and her cellulitis is back.  Reports no weight gain and state weight today of 160 pounds. Reports legs are weeping. States CBG of 100-200. States she is taking insulin at 42 units.   PLAN: reviewed with patient to attend her MD appointment and verbalize her concerns. Also encourage patient to discuss her needs with home health nurse about ordering her dressing supplies.  Will plan follow up all in 1 week.  Tomasa Rand, RN, BSN, CEN [facility] [employer] [phone]

## 2020-01-31 NOTE — Patient Instructions (Signed)

## 2020-01-31 NOTE — Telephone Encounter (Signed)
Agree w/OV Thx

## 2020-02-01 LAB — HEPATIC FUNCTION PANEL
AG Ratio: 1.1 (calc) (ref 1.0–2.5)
ALT: 19 U/L (ref 6–29)
AST: 18 U/L (ref 10–35)
Albumin: 3.2 g/dL — ABNORMAL LOW (ref 3.6–5.1)
Alkaline phosphatase (APISO): 131 U/L (ref 37–153)
Bilirubin, Direct: 0.1 mg/dL (ref 0.0–0.2)
Globulin: 3 g/dL (calc) (ref 1.9–3.7)
Indirect Bilirubin: 0.3 mg/dL (calc) (ref 0.2–1.2)
Total Bilirubin: 0.4 mg/dL (ref 0.2–1.2)
Total Protein: 6.2 g/dL (ref 6.1–8.1)

## 2020-02-01 LAB — BASIC METABOLIC PANEL WITH GFR
BUN/Creatinine Ratio: 22 (calc) (ref 6–22)
BUN: 64 mg/dL — ABNORMAL HIGH (ref 7–25)
CO2: 25 mmol/L (ref 20–32)
Calcium: 9.7 mg/dL (ref 8.6–10.4)
Chloride: 100 mmol/L (ref 98–110)
Creat: 2.96 mg/dL — ABNORMAL HIGH (ref 0.60–0.93)
GFR, Est African American: 17 mL/min/{1.73_m2} — ABNORMAL LOW (ref >=60)
GFR, Est Non African American: 15 mL/min/{1.73_m2} — ABNORMAL LOW (ref >=60)
Glucose, Bld: 357 mg/dL — ABNORMAL HIGH (ref 65–99)
Potassium: 4.2 mmol/L (ref 3.5–5.3)
Sodium: 141 mmol/L (ref 135–146)

## 2020-02-01 LAB — THYROID PANEL WITH TSH
Free Thyroxine Index: 2.3 (ref 1.4–3.8)
T3 Uptake: 31 % (ref 22–35)
T4, Total: 7.5 ug/dL (ref 5.1–11.9)
TSH: 2.15 mIU/L (ref 0.40–4.50)

## 2020-02-01 LAB — HEMOGLOBIN A1C
Hgb A1c MFr Bld: 12.3 % of total Hgb — ABNORMAL HIGH (ref <5.7)
Mean Plasma Glucose: 306 (calc)
eAG (mmol/L): 17 (calc)

## 2020-02-01 LAB — SEDIMENTATION RATE: Sed Rate: 29 mm/h (ref 0–30)

## 2020-02-01 MED ORDER — HUMALOG KWIKPEN 200 UNIT/ML ~~LOC~~ SOPN: 10.0000 [IU] | PEN_INJECTOR | Freq: Three times a day (TID) | SUBCUTANEOUS | 1 refills | Status: DC

## 2020-02-01 MED ORDER — TOUJEO SOLOSTAR 300 UNIT/ML ~~LOC~~ SOPN: 60.0000 [IU] | PEN_INJECTOR | Freq: Every morning | SUBCUTANEOUS | 3 refills | Status: DC

## 2020-02-03 DIAGNOSIS — N186 End stage renal disease: Secondary | ICD-10-CM | POA: Insufficient documentation

## 2020-02-03 NOTE — Addendum Note (Signed)
Addended by: Janith Lima on: 02/03/2020 12:25 PM   Modules accepted: Orders

## 2020-02-04 DIAGNOSIS — Z794 Long term (current) use of insulin: Secondary | ICD-10-CM

## 2020-02-04 DIAGNOSIS — M1030 Gout due to renal impairment, unspecified site: Secondary | ICD-10-CM

## 2020-02-04 DIAGNOSIS — Z954 Presence of other heart-valve replacement: Secondary | ICD-10-CM

## 2020-02-04 DIAGNOSIS — D509 Iron deficiency anemia, unspecified: Secondary | ICD-10-CM

## 2020-02-04 DIAGNOSIS — Z952 Presence of prosthetic heart valve: Secondary | ICD-10-CM

## 2020-02-04 DIAGNOSIS — N186 End stage renal disease: Secondary | ICD-10-CM | POA: Diagnosis not present

## 2020-02-04 DIAGNOSIS — Z940 Kidney transplant status: Secondary | ICD-10-CM

## 2020-02-04 DIAGNOSIS — E785 Hyperlipidemia, unspecified: Secondary | ICD-10-CM

## 2020-02-04 DIAGNOSIS — Z992 Dependence on renal dialysis: Secondary | ICD-10-CM

## 2020-02-04 DIAGNOSIS — K5730 Diverticulosis of large intestine without perforation or abscess without bleeding: Secondary | ICD-10-CM | POA: Diagnosis not present

## 2020-02-04 DIAGNOSIS — I2510 Atherosclerotic heart disease of native coronary artery without angina pectoris: Secondary | ICD-10-CM | POA: Diagnosis not present

## 2020-02-04 DIAGNOSIS — K8020 Calculus of gallbladder without cholecystitis without obstruction: Secondary | ICD-10-CM

## 2020-02-04 DIAGNOSIS — G4730 Sleep apnea, unspecified: Secondary | ICD-10-CM

## 2020-02-04 DIAGNOSIS — E1122 Type 2 diabetes mellitus with diabetic chronic kidney disease: Secondary | ICD-10-CM | POA: Diagnosis not present

## 2020-02-04 DIAGNOSIS — K2100 Gastro-esophageal reflux disease with esophagitis, without bleeding: Secondary | ICD-10-CM

## 2020-02-04 DIAGNOSIS — H9190 Unspecified hearing loss, unspecified ear: Secondary | ICD-10-CM

## 2020-02-04 DIAGNOSIS — C3411 Malignant neoplasm of upper lobe, right bronchus or lung: Secondary | ICD-10-CM | POA: Diagnosis not present

## 2020-02-04 DIAGNOSIS — I4819 Other persistent atrial fibrillation: Secondary | ICD-10-CM | POA: Diagnosis not present

## 2020-02-04 DIAGNOSIS — J9611 Chronic respiratory failure with hypoxia: Secondary | ICD-10-CM

## 2020-02-04 DIAGNOSIS — I5033 Acute on chronic diastolic (congestive) heart failure: Secondary | ICD-10-CM | POA: Diagnosis not present

## 2020-02-04 DIAGNOSIS — Z7901 Long term (current) use of anticoagulants: Secondary | ICD-10-CM

## 2020-02-04 DIAGNOSIS — I152 Hypertension secondary to endocrine disorders: Secondary | ICD-10-CM | POA: Diagnosis not present

## 2020-02-04 DIAGNOSIS — J449 Chronic obstructive pulmonary disease, unspecified: Secondary | ICD-10-CM | POA: Diagnosis not present

## 2020-02-04 DIAGNOSIS — Z87891 Personal history of nicotine dependence: Secondary | ICD-10-CM

## 2020-02-04 DIAGNOSIS — Z87440 Personal history of urinary (tract) infections: Secondary | ICD-10-CM

## 2020-02-05 ENCOUNTER — Telehealth: Payer: Self-pay

## 2020-02-05 NOTE — Telephone Encounter (Signed)
02/05/20 Spoke with patient about [place] aide services.  Will follow-up with patient later this week. [name] [phone]

## 2020-02-05 NOTE — Addendum Note (Signed)
Addended by: Aviva Signs M on: 02/05/2020 11:18 AM   Modules accepted: Orders

## 2020-02-06 ENCOUNTER — Ambulatory Visit: Payer: Self-pay | Admitting: Cardiology

## 2020-02-06 DIAGNOSIS — I480 Paroxysmal atrial fibrillation: Secondary | ICD-10-CM

## 2020-02-06 DIAGNOSIS — Z5181 Encounter for therapeutic drug level monitoring: Secondary | ICD-10-CM

## 2020-02-06 DIAGNOSIS — I4819 Other persistent atrial fibrillation: Secondary | ICD-10-CM | POA: Diagnosis not present

## 2020-02-06 DIAGNOSIS — I5033 Acute on chronic diastolic (congestive) heart failure: Secondary | ICD-10-CM | POA: Diagnosis not present

## 2020-02-06 DIAGNOSIS — E1122 Type 2 diabetes mellitus with diabetic chronic kidney disease: Secondary | ICD-10-CM | POA: Diagnosis not present

## 2020-02-06 DIAGNOSIS — I2510 Atherosclerotic heart disease of native coronary artery without angina pectoris: Secondary | ICD-10-CM | POA: Diagnosis not present

## 2020-02-06 DIAGNOSIS — I152 Hypertension secondary to endocrine disorders: Secondary | ICD-10-CM | POA: Diagnosis not present

## 2020-02-06 DIAGNOSIS — C3411 Malignant neoplasm of upper lobe, right bronchus or lung: Secondary | ICD-10-CM | POA: Diagnosis not present

## 2020-02-06 LAB — POCT INR: INR: 5.4 — AB (ref 2.0–3.0)

## 2020-02-07 ENCOUNTER — Other Ambulatory Visit: Payer: Self-pay

## 2020-02-07 NOTE — Patient Outreach (Signed)
[facility]) Care Management  02/07/2020  [name] 11-Jun-1946 [id number]    Telephone assessment:   Placed call to patient who answered and reports she is about the same. She appears to be having more issues with speech today. ( due to past stroke).  Reports CBG of 234 this morning.  Reports she saw MD and insulin doses were changed and she does not understand.  I reviewed medical record and had a long conversation with patient about new plan for insulin.  After discussion appears that patient understands.  She reports she continues to have weeping legs and states she was told they think it is a vascular issues. Reports she has been referred to vascular and endocrinology. Reports weight is unchanged today at 161 pounds.   PLAN: will follow up in 2 weeks by phone.  Tomasa Rand, RN, BSN, CEN [facility] [employer] [phone]

## 2020-02-08 ENCOUNTER — Telehealth: Payer: Self-pay | Admitting: Internal Medicine

## 2020-02-08 NOTE — Telephone Encounter (Signed)
Will resend fax monday

## 2020-02-08 NOTE — Telephone Encounter (Signed)
F/u    [facility] is calling aware that Dr. Alain Marion is off today.   Asking can the CMA refax back over the signature is cut off and the Doc was distorted.

## 2020-02-08 NOTE — Telephone Encounter (Signed)
02/08/20 Spoke with patient she has contacted [employer] and is waiting for a call back.  She plans to call again next week.  Ambrose Mantle [phone]

## 2020-02-08 NOTE — Telephone Encounter (Signed)
New message:   [name] is calling from [employer] and states that the fax that came back to them from the Doc was distorted and without a signature and that's why they resent the fax. She states she is going to fax it again and just needs a signature from the doc. Please advise.

## 2020-02-13 ENCOUNTER — Ambulatory Visit (INDEPENDENT_AMBULATORY_CARE_PROVIDER_SITE_OTHER): Payer: Medicare Other | Admitting: Pharmacist

## 2020-02-13 ENCOUNTER — Telehealth: Payer: Self-pay | Admitting: Cardiology

## 2020-02-13 DIAGNOSIS — C3411 Malignant neoplasm of upper lobe, right bronchus or lung: Secondary | ICD-10-CM | POA: Diagnosis not present

## 2020-02-13 DIAGNOSIS — Z5181 Encounter for therapeutic drug level monitoring: Secondary | ICD-10-CM | POA: Diagnosis not present

## 2020-02-13 DIAGNOSIS — I2510 Atherosclerotic heart disease of native coronary artery without angina pectoris: Secondary | ICD-10-CM | POA: Diagnosis not present

## 2020-02-13 DIAGNOSIS — I4819 Other persistent atrial fibrillation: Secondary | ICD-10-CM | POA: Diagnosis not present

## 2020-02-13 DIAGNOSIS — I480 Paroxysmal atrial fibrillation: Secondary | ICD-10-CM | POA: Diagnosis not present

## 2020-02-13 DIAGNOSIS — I152 Hypertension secondary to endocrine disorders: Secondary | ICD-10-CM | POA: Diagnosis not present

## 2020-02-13 DIAGNOSIS — I5033 Acute on chronic diastolic (congestive) heart failure: Secondary | ICD-10-CM | POA: Diagnosis not present

## 2020-02-13 DIAGNOSIS — E1122 Type 2 diabetes mellitus with diabetic chronic kidney disease: Secondary | ICD-10-CM | POA: Diagnosis not present

## 2020-02-13 LAB — POCT INR: INR: 2.3 (ref 2.0–3.0)

## 2020-02-13 NOTE — Telephone Encounter (Signed)
Can you please re fax, still have not received these forms.

## 2020-02-13 NOTE — Telephone Encounter (Signed)
See anticoagulation note 

## 2020-02-13 NOTE — Telephone Encounter (Signed)
[name] with [facility] is calling to report PT INR results. Please return call to discuss at [phone].

## 2020-02-14 NOTE — Telephone Encounter (Signed)
Spoke to [employer] and informed her I have tried to send a fax multiple times and it is not going through. Was given an alternative fax and will also mail copies

## 2020-02-15 DIAGNOSIS — G4730 Sleep apnea, unspecified: Secondary | ICD-10-CM | POA: Diagnosis not present

## 2020-02-15 DIAGNOSIS — Z992 Dependence on renal dialysis: Secondary | ICD-10-CM | POA: Diagnosis not present

## 2020-02-15 DIAGNOSIS — Z87440 Personal history of urinary (tract) infections: Secondary | ICD-10-CM | POA: Diagnosis not present

## 2020-02-15 DIAGNOSIS — K2100 Gastro-esophageal reflux disease with esophagitis, without bleeding: Secondary | ICD-10-CM | POA: Diagnosis not present

## 2020-02-15 DIAGNOSIS — H9190 Unspecified hearing loss, unspecified ear: Secondary | ICD-10-CM | POA: Diagnosis not present

## 2020-02-15 DIAGNOSIS — E1122 Type 2 diabetes mellitus with diabetic chronic kidney disease: Secondary | ICD-10-CM | POA: Diagnosis not present

## 2020-02-15 DIAGNOSIS — M1030 Gout due to renal impairment, unspecified site: Secondary | ICD-10-CM | POA: Diagnosis not present

## 2020-02-15 DIAGNOSIS — J9611 Chronic respiratory failure with hypoxia: Secondary | ICD-10-CM | POA: Diagnosis not present

## 2020-02-15 DIAGNOSIS — Z87891 Personal history of nicotine dependence: Secondary | ICD-10-CM | POA: Diagnosis not present

## 2020-02-15 DIAGNOSIS — I2510 Atherosclerotic heart disease of native coronary artery without angina pectoris: Secondary | ICD-10-CM | POA: Diagnosis not present

## 2020-02-15 DIAGNOSIS — D509 Iron deficiency anemia, unspecified: Secondary | ICD-10-CM | POA: Diagnosis not present

## 2020-02-15 DIAGNOSIS — C3411 Malignant neoplasm of upper lobe, right bronchus or lung: Secondary | ICD-10-CM | POA: Diagnosis not present

## 2020-02-15 DIAGNOSIS — I4819 Other persistent atrial fibrillation: Secondary | ICD-10-CM | POA: Diagnosis not present

## 2020-02-15 DIAGNOSIS — Z952 Presence of prosthetic heart valve: Secondary | ICD-10-CM | POA: Diagnosis not present

## 2020-02-15 DIAGNOSIS — J449 Chronic obstructive pulmonary disease, unspecified: Secondary | ICD-10-CM | POA: Diagnosis not present

## 2020-02-15 DIAGNOSIS — Z7901 Long term (current) use of anticoagulants: Secondary | ICD-10-CM | POA: Diagnosis not present

## 2020-02-15 DIAGNOSIS — K8020 Calculus of gallbladder without cholecystitis without obstruction: Secondary | ICD-10-CM | POA: Diagnosis not present

## 2020-02-15 DIAGNOSIS — K5730 Diverticulosis of large intestine without perforation or abscess without bleeding: Secondary | ICD-10-CM | POA: Diagnosis not present

## 2020-02-15 DIAGNOSIS — Z940 Kidney transplant status: Secondary | ICD-10-CM | POA: Diagnosis not present

## 2020-02-15 DIAGNOSIS — N186 End stage renal disease: Secondary | ICD-10-CM | POA: Diagnosis not present

## 2020-02-15 DIAGNOSIS — Z794 Long term (current) use of insulin: Secondary | ICD-10-CM | POA: Diagnosis not present

## 2020-02-15 DIAGNOSIS — I5033 Acute on chronic diastolic (congestive) heart failure: Secondary | ICD-10-CM | POA: Diagnosis not present

## 2020-02-15 DIAGNOSIS — I152 Hypertension secondary to endocrine disorders: Secondary | ICD-10-CM | POA: Diagnosis not present

## 2020-02-15 DIAGNOSIS — E785 Hyperlipidemia, unspecified: Secondary | ICD-10-CM | POA: Diagnosis not present

## 2020-02-19 NOTE — Progress Notes (Signed)
HPI: FU aortic stenosis s/p AVR and atrial fibrillation. Patient has a history of embolic CVA. She has a history of coronary artery disease, status post PCI of the right coronary artery. She underwent cardiac catheterization in September 2014 for severe AS. The right coronary was totally occluded but no other coronary disease noted.Patient had aortic valve replacement with pericardial tissue valve and also had Maze procedure. Carotid Dopplers September 2018 showed 1 to 39% bilateral stenosis. Abdominal ultrasound October 2018 showed no abdominal aortic aneurysm and no focal stenosis in the aorta or iliacs; abnormal ABIs; PVD followed by Dr Fletcher Anon.Nuclear studyMay 2019 showed ejection fraction 59% and normal perfusion. Patient has a known lung tumor but has declined resection. She did receive radiation therapy.Echocardiogram March 2021 showed normal LV function, mild left ventricular hypertrophy, severe pulmonary hypertension, biatrial enlargement, mild to moderate mitral regurgitation, mild to moderate tricuspid regurgitation, prior aortic valve replacement with mean gradient 13 mmHg. Patient admitted March 2021 with CHF. She was diuresed. Since she was last seenshe notes some dyspnea on exertion.  No orthopnea or PND.  She has chronic pedal edema.  She denies chest pain or syncope.  She has nonhealing ulcers on her legs.  Current Outpatient Medications  Medication Sig Dispense Refill  . acetaminophen (TYLENOL) 500 MG tablet Take 500 mg by mouth every 6 (six) hours as needed for mild pain or headache.    Marland Kitchen amoxicillin (AMOXIL) 500 MG capsule TAKE 4 CAPSULES BY MOUTH 1 HOUR PRIOR TO DENTAL WORK    . clobetasol ointment (TEMOVATE) 5.36 % Apply 1 application topically 2 (two) times daily. 60 g 1  . dapsone 25 MG tablet Take 25 mg by mouth daily.     Marland Kitchen gabapentin (NEURONTIN) 300 MG capsule Take 1 capsule (300 mg total) by mouth 3 (three) times daily. 90 capsule 5  . hydrALAZINE (APRESOLINE) 25  MG tablet Take 25 mg by mouth 3 (three) times daily.    . insulin glargine, 1 Unit Dial, (TOUJEO SOLOSTAR) 300 UNIT/ML Solostar Pen Inject 60 Units into the skin every morning. Titrate up by 1 unit a day if needed for goal sugars of 100-130 up to 50 units a day max 3 pen 3  . insulin lispro (HUMALOG KWIKPEN) 200 UNIT/ML KwikPen Inject 10 Units into the skin with breakfast, with lunch, and with evening meal. 9 mL 1  . Insulin Pen Needle (B-D UF III MINI PEN NEEDLES) 31G X 5 MM MISC USE TO ADMINISTER INSULIN FOUR TIMES A DAY DX E11.9 100 each 1  . levothyroxine (SYNTHROID) 25 MCG tablet TAKE 2 TABLETS (50 MCG TOTAL) BY MOUTH DAILY. 180 tablet 3  . metoprolol tartrate (LOPRESSOR) 25 MG tablet Take 0.5 tablets (12.5 mg total) by mouth 2 (two) times daily. 90 tablet 3  . ONETOUCH DELICA LANCETS 64Q MISC Use to check blood sugars three times a day DX E11.9 100 each 5  . ONETOUCH VERIO test strip USE TO TEST 3 TIMES DAILY. DX E11.9 100 strip 5  . oxyCODONE-acetaminophen (PERCOCET/ROXICET) 5-325 MG tablet Take 1 tablet by mouth every 8 (eight) hours as needed for severe pain. 60 tablet 0  . OXYGEN Inhale 3 L into the lungs as needed (for shortness of breath).     . predniSONE (DELTASONE) 5 MG tablet Take 1 tablet by mouth daily.    Marland Kitchen Propylene Glycol (SYSTANE BALANCE) 0.6 % SOLN Place 1-2 drops into both eyes 3 (three) times daily as needed (for dryness).    Marland Kitchen  repaglinide (PRANDIN) 0.5 MG tablet Take 0.5 mg by mouth 3 (three) times daily before meals.    . rosuvastatin (CRESTOR) 20 MG tablet Take 1 tablet (20 mg total) by mouth daily. (Patient taking differently: Take 20 mg by mouth every other day. ) 90 tablet 3  . tacrolimus (PROGRAF) 1 MG capsule Take 3 mg by mouth 2 (two) times daily.     Marland Kitchen torsemide (DEMADEX) 100 MG tablet Take 2 tablets (200 mg total) by mouth daily. 60 tablet 5  . triamcinolone cream (KENALOG) 0.1 % Apply 1 application topically 4 (four) times daily. 80 g 1  . warfarin (COUMADIN) 5  MG tablet TAKE 1/2 TO 1 TABLET BY MOUTH DAILY AS DIRECTED BY COUMADIN CLINIC 90 tablet 1  . potassium chloride SA (KLOR-CON M20) 20 MEQ tablet Take 1 tablet (20 mEq total) by mouth daily. (Patient taking differently: Take 40 mEq by mouth daily. ) 30 tablet 3   No current facility-administered medications for this visit.     Past Medical History:  Diagnosis Date  . Acute CHF (congestive heart failure) ([place]) 12/2018  . Adenocarcinoma of lung, stage 1, right ([place]) 11/25/2017  . Anemia, iron deficiency    of chronic disease  . Aortic stenosis    a. Severe AS by echo 11/2012.  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 Asystole ([place])    a. During ENT surgery 2005: developed marked asystole requiring CPR, felt due to vagal reaction (cath nonobst dz).  . Carotid artery disease ([place])    a. Carotid Dopplers performed in August 2013 showed 40-59% left stenosis and 0-39% right; f/u recommended in 2 years.   . Cerebrovascular accident [facility]) 2009   a. LMCA infarct felt embolic 2947, maintained on chronic coumadin.; denies residual on 04/05/2013  . Cholelithiasis   . Chronic Persistent Atrial Fibrillation 12/31/2008   Qualifier: Diagnosis of  By: [name]    . Coronary artery disease 05/2002   a. Ant MI 2003 s/p PTCA/stent to RCA.   . Diverticulosis of colon   . Esophagitis, reflux   . ESRD (end stage renal disease) ([place])    a. Mass on L kidney per pt s/p nephrectomy - pt states not cancer - [facility] notes indicate ESRD due to HTN/DM - was previously on HD. b. Kidney transplant 02/2011.  Marland Kitchen GERD (gastroesophageal reflux disease)   . Gout   . Hearing loss   . Helicobacter pylori (H. pylori) infection    hx of  . Hemorrhoids   . Hx of colonic polyps    adenomatous  . Hyperlipidemia   . Hypertension   . Lung nodule seen on imaging study 04/07/2013   1.0 cm ground glass opacity RUL  . Myocardial infarction ([place]) 2003  . Pericardial effusion    a. Small by echo 11/2011.  . S/P  aortic valve replacement with bioprosthetic valve and maze procedure 04/12/2013   55mm [facility] Ease bovine pericardial tissue valve   . S/P Maze operation for atrial fibrillation 04/12/2013   Complete bilateral atrial lesion set using bipolar radiofrequency and cryothermy ablation with clipping of LA appendage  . Sleep apnea    Pt says testing was positive, intolerant of CPAP.  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  . Stroke ([place])   . Type II diabetes mellitus ([place])  Past Surgical History:  Procedure Laterality Date  . AORTIC VALVE REPLACEMENT N/A 04/12/2013   Procedure: AORTIC VALVE REPLACEMENT (AVR);  Surgeon: Rexene Alberts, MD;  Location: [place];  Service: Open Heart Surgery;  Laterality: N/A;  . ARTERIOVENOUS GRAFT PLACEMENT Left   . ARTERIOVENOUS GRAFT PLACEMENT Left    "I've had 2 on my left; had one removed" (04/05/2013)   . ARTERY EXPLORATION Right 04/11/2013   Procedure: ARTERY EXPLORATION;  Surgeon: Rexene Alberts, MD;  Location: [place];  Service: Open Heart Surgery;  Laterality: Right;  Right carotid artery exploration  . AV FISTULA PLACEMENT Right   . AV FISTULA REPAIR Right    "took it out" ((/18/2014)  . CARDIOVERSION  05/29/2012   Procedure: CARDIOVERSION;  Surgeon: Lelon Perla, MD;  Location[facility] ENDOSCOPY;  Service: Cardiovascular;  Laterality: N/A;  . CHOLECYSTECTOMY  2009   with hernia removal  . CORONARY ANGIOPLASTY WITH STENT PLACEMENT Right    coronary artery  . INSERTION OF DIALYSIS CATHETER Bilateral    "over the years; took them both out" (04/05/2013)  . INTRAOPERATIVE TRANSESOPHAGEAL ECHOCARDIOGRAM N/A 04/11/2013   Procedure: INTRAOPERATIVE TRANSESOPHAGEAL ECHOCARDIOGRAM;  Surgeon: Rexene Alberts, MD;  Location: [place];  Service: Open Heart Surgery;  Laterality: N/A;  . INTRAOPERATIVE  TRANSESOPHAGEAL ECHOCARDIOGRAM N/A 04/12/2013   Procedure: INTRAOPERATIVE TRANSESOPHAGEAL ECHOCARDIOGRAM;  Surgeon: Rexene Alberts, MD;  Location: [place];  Service: Open Heart Surgery;  Laterality: N/A;  . KIDNEY TRANSPLANT  03/16/11  . LEFT AND RIGHT HEART CATHETERIZATION WITH CORONARY ANGIOGRAM N/A 04/06/2013   Procedure: LEFT AND RIGHT HEART CATHETERIZATION WITH CORONARY ANGIOGRAM;  Surgeon: Blane Ohara, MD;  Location: [facility] CATH LAB;  Service: Cardiovascular;  Laterality: N/A;  . MAZE N/A 04/12/2013   Procedure: MAZE;  Surgeon: Rexene Alberts, MD;  Location: [place];  Service: Open Heart Surgery;  Laterality: N/A;  . NASAL RECONSTRUCTION WITH SEPTAL REPAIR     "took it out" (04/05/2013)  . NEPHRECTOMY Left 2010   no CA on bx  . TONSILLECTOMY    . TOTAL ABDOMINAL HYSTERECTOMY    . TUBAL LIGATION      Social History   Socioeconomic History  . Marital status: Married    Spouse name: Not on file  . Number of children: 5  . Years of education: 19  . Highest education level: Not on file  Occupational History  . Occupation: retired    Fish farm manager: RETIRED  Tobacco Use  . Smoking status: Former Smoker    Packs/day: 1.00    Years: 30.00    Pack years: 30.00    Types: Cigarettes    Quit date: 07/19/2001    Years since quitting: 18.6  . Smokeless tobacco: Never Used  Vaping Use  . Vaping Use: Never used  Substance and Sexual Activity  . Alcohol use: No  . Drug use: No  . Sexual activity: Not Currently  Other Topics Concern  . Not on file  Social History Narrative   Patient signed a Designated Party Release to allow her spouse [name] and family and five children to have access to her medical records/information.     Patient lives with husband in a 2 story home.  Has 5 children. Retired from Personal assistant.  Education: 3 years of college.    Social Determinants of Health   Financial Resource Strain:   . Difficulty of Paying Living Expenses:   Food Insecurity: No Food Insecurity  .  Worried About [employer] of  Food in the Last Year: Never true  . Ran Out of Food in the Last Year: Never true  Transportation Needs: No Transportation Needs  . Lack of Transportation (Medical): No  . Lack of Transportation (Non-Medical): No  Physical Activity: Inactive  . Days of Exercise per Week: 0 days  . Minutes of Exercise per Session: 0 min  Stress: Stress Concern Present  . Feeling of Stress : Rather much  Social Connections:   . Frequency of Communication with Friends and Family:   . Frequency of Social Gatherings with Friends and Family:   . Attends Religious Services:   . Active Member of Clubs or Organizations:   . Attends Archivist Meetings:   Marland Kitchen Marital Status:   Intimate Partner Violence: Not At Risk  . Fear of Current or Ex-Partner: No  . Emotionally Abused: No  . Physically Abused: No  . Sexually Abused: No    Family History  Problem Relation Age of Onset  . Stroke Father   . Hypertension Mother   . Diabetes Other   . Diabetes Maternal Grandmother   . Diabetes Son   . Crohn's disease Other        grandson   . Breast cancer Neg Hx   . Stomach cancer Neg Hx   . Esophageal cancer Neg Hx   . Colon cancer Neg Hx   . Pancreatic cancer Neg Hx     ROS: no fevers or chills, productive cough, hemoptysis, dysphasia, odynophagia, melena, hematochezia, dysuria, hematuria, rash, seizure activity, orthopnea, PND,  claudication. Remaining systems are negative.  Physical Exam: Well-developed well-nourished in no acute distress.  Skin is warm and dry.  HEENT is normal.  Neck is supple.  Chest is clear to auscultation with normal expansion.  Cardiovascular exam is regular rate and rhythm.  2/6 systolic murmur Abdominal exam nontender or distended. No masses palpated. Extremities show 1+ edema.  Ulcer noted on left lower extremity neuro grossly intact  Electrocardiogram today shows sinus rhythm with PACs, lateral T wave inversion.  A/P  1 chronic  diastolic congestive heart failure-patient appears to be mildly volume overloaded.  However she did not take her diuretic today.  Continue Demadex at present dose.  Needs fluid restriction and low-sodium diet.  2 prior aortic valve replacement-continue SBE prophylaxis.  Most recent echocardiogram shows normally functioning prosthetic valve.  3 paroxysmal atrial fibrillation-continue beta-blocker and Coumadin.  4 coronary artery disease-continue statin.  No aspirin given need for Coumadin.  5 chronic stage IV kidney disease-followed by nephrology.  She has had previous transplant.  6 lower extremity pain-likely secondary to edema.  She is scheduled for ABIs later this month.  She has nonhealing ulcers and has been referred to the wound clinic.  [name], MD

## 2020-02-20 ENCOUNTER — Ambulatory Visit (INDEPENDENT_AMBULATORY_CARE_PROVIDER_SITE_OTHER): Payer: Medicare Other | Admitting: Pharmacist

## 2020-02-20 DIAGNOSIS — I152 Hypertension secondary to endocrine disorders: Secondary | ICD-10-CM | POA: Diagnosis not present

## 2020-02-20 DIAGNOSIS — I480 Paroxysmal atrial fibrillation: Secondary | ICD-10-CM | POA: Diagnosis not present

## 2020-02-20 DIAGNOSIS — I2510 Atherosclerotic heart disease of native coronary artery without angina pectoris: Secondary | ICD-10-CM | POA: Diagnosis not present

## 2020-02-20 DIAGNOSIS — C3411 Malignant neoplasm of upper lobe, right bronchus or lung: Secondary | ICD-10-CM | POA: Diagnosis not present

## 2020-02-20 DIAGNOSIS — Z5181 Encounter for therapeutic drug level monitoring: Secondary | ICD-10-CM

## 2020-02-20 DIAGNOSIS — I4819 Other persistent atrial fibrillation: Secondary | ICD-10-CM | POA: Diagnosis not present

## 2020-02-20 DIAGNOSIS — E1122 Type 2 diabetes mellitus with diabetic chronic kidney disease: Secondary | ICD-10-CM | POA: Diagnosis not present

## 2020-02-20 DIAGNOSIS — I5033 Acute on chronic diastolic (congestive) heart failure: Secondary | ICD-10-CM | POA: Diagnosis not present

## 2020-02-20 LAB — POCT INR: INR: 1.8 — AB (ref 2.0–3.0)

## 2020-02-21 ENCOUNTER — Other Ambulatory Visit: Payer: Self-pay

## 2020-02-22 NOTE — Patient Outreach (Signed)
[facility]) Care Management  02/22/2020  [name] [birth year] [id number]  Telephone assessment: ( 02/21/2020)  Placed call to patient who reports she is feeling okay. . Reports legs are getting better but they are still leaking. Reports she continues to need her oxygen.   As of 4:19 ( 02/21/2020) patient reports me that she has not monitored her CBG, eaten or taking her insulin for the day.  CBG checked while on the phone with me an result was 88.   Reports she is getting assistance with house keeping.   Reports continues to weigh daily.  Range 159-161 pounds.  Deneis any leg swelling and report normal amounts of shortness of breath.   PLAN: encouraged patient to no skip meals and to take her medications as prescribed. Reviewed all pending follow up appointments.  Plan: telephone follow up in 1 month. Encouraged patient to call sooner if needed.  Tomasa Rand, RN, BSN, CEN [facility] [employer] [phone]

## 2020-02-25 ENCOUNTER — Other Ambulatory Visit: Payer: Self-pay

## 2020-02-25 ENCOUNTER — Telehealth: Payer: Self-pay | Admitting: Internal Medicine

## 2020-02-25 ENCOUNTER — Encounter: Payer: Self-pay | Admitting: Cardiology

## 2020-02-25 ENCOUNTER — Ambulatory Visit (INDEPENDENT_AMBULATORY_CARE_PROVIDER_SITE_OTHER): Payer: Medicare Other | Admitting: Cardiology

## 2020-02-25 VITALS — BP 122/80 | HR 72 | Temp 97.1°F | Ht 60.0 in | Wt 169.0 lb

## 2020-02-25 DIAGNOSIS — I5032 Chronic diastolic (congestive) heart failure: Secondary | ICD-10-CM | POA: Diagnosis not present

## 2020-02-25 DIAGNOSIS — Z952 Presence of prosthetic heart valve: Secondary | ICD-10-CM | POA: Diagnosis not present

## 2020-02-25 DIAGNOSIS — I2510 Atherosclerotic heart disease of native coronary artery without angina pectoris: Secondary | ICD-10-CM | POA: Diagnosis not present

## 2020-02-25 DIAGNOSIS — I480 Paroxysmal atrial fibrillation: Secondary | ICD-10-CM

## 2020-02-25 NOTE — Telephone Encounter (Signed)
[name] with [facility] dropped off home health orders 02/21/2020 and said that you can give him a call [phone] when they are ready to be picked up.

## 2020-02-25 NOTE — Patient Instructions (Signed)

## 2020-02-26 NOTE — Telephone Encounter (Signed)
Called Doug there was no answer LMOM form was faxed, but will also leave up front for p/u.Marland KitchenJohny Chess

## 2020-02-27 ENCOUNTER — Ambulatory Visit (INDEPENDENT_AMBULATORY_CARE_PROVIDER_SITE_OTHER): Payer: Medicare Other | Admitting: Internal Medicine

## 2020-02-27 ENCOUNTER — Encounter: Payer: Self-pay | Admitting: Internal Medicine

## 2020-02-27 ENCOUNTER — Ambulatory Visit (INDEPENDENT_AMBULATORY_CARE_PROVIDER_SITE_OTHER): Payer: Medicare Other | Admitting: Cardiology

## 2020-02-27 ENCOUNTER — Other Ambulatory Visit: Payer: Self-pay | Admitting: Internal Medicine

## 2020-02-27 ENCOUNTER — Other Ambulatory Visit: Payer: Self-pay

## 2020-02-27 DIAGNOSIS — I4819 Other persistent atrial fibrillation: Secondary | ICD-10-CM | POA: Diagnosis not present

## 2020-02-27 DIAGNOSIS — M79673 Pain in unspecified foot: Secondary | ICD-10-CM

## 2020-02-27 DIAGNOSIS — M5440 Lumbago with sciatica, unspecified side: Secondary | ICD-10-CM

## 2020-02-27 DIAGNOSIS — E1122 Type 2 diabetes mellitus with diabetic chronic kidney disease: Secondary | ICD-10-CM | POA: Diagnosis not present

## 2020-02-27 DIAGNOSIS — G8929 Other chronic pain: Secondary | ICD-10-CM | POA: Diagnosis not present

## 2020-02-27 DIAGNOSIS — Z1231 Encounter for screening mammogram for malignant neoplasm of breast: Secondary | ICD-10-CM

## 2020-02-27 DIAGNOSIS — R609 Edema, unspecified: Secondary | ICD-10-CM

## 2020-02-27 DIAGNOSIS — I2510 Atherosclerotic heart disease of native coronary artery without angina pectoris: Secondary | ICD-10-CM

## 2020-02-27 DIAGNOSIS — E1140 Type 2 diabetes mellitus with diabetic neuropathy, unspecified: Secondary | ICD-10-CM

## 2020-02-27 DIAGNOSIS — I5033 Acute on chronic diastolic (congestive) heart failure: Secondary | ICD-10-CM | POA: Diagnosis not present

## 2020-02-27 DIAGNOSIS — Z5181 Encounter for therapeutic drug level monitoring: Secondary | ICD-10-CM

## 2020-02-27 DIAGNOSIS — C3411 Malignant neoplasm of upper lobe, right bronchus or lung: Secondary | ICD-10-CM | POA: Diagnosis not present

## 2020-02-27 DIAGNOSIS — I480 Paroxysmal atrial fibrillation: Secondary | ICD-10-CM

## 2020-02-27 DIAGNOSIS — I872 Venous insufficiency (chronic) (peripheral): Secondary | ICD-10-CM | POA: Diagnosis not present

## 2020-02-27 DIAGNOSIS — I152 Hypertension secondary to endocrine disorders: Secondary | ICD-10-CM | POA: Diagnosis not present

## 2020-02-27 LAB — POCT INR: INR: 2.7 (ref 2.0–3.0)

## 2020-02-27 MED ORDER — LIDOCAINE 5 % EX OINT
1.0000 | TOPICAL_OINTMENT | Freq: Four times a day (QID) | CUTANEOUS | 1 refills | Status: AC | PRN
Start: 2020-02-27 — End: ?

## 2020-02-27 MED ORDER — OXYCODONE-ACETAMINOPHEN 5-325 MG PO TABS: 1.0000 | ORAL_TABLET | Freq: Three times a day (TID) | ORAL | 0 refills | Status: DC | PRN

## 2020-02-27 NOTE — Assessment & Plan Note (Addendum)
Oxycodone APAP prn  Potential benefits of a long term opioids use as well as potential risks (i.e. addiction risk, apnea etc) and complications (i.e. Somnolence, constipation and others) were explained to the patient and were aknowledged. Lido on ulcers prn

## 2020-02-27 NOTE — Assessment & Plan Note (Signed)
Wound clinic ref - appt pending this Fri

## 2020-02-27 NOTE — Assessment & Plan Note (Signed)
Oxycodone APAP prn  Potential benefits of a long term opioids use as well as potential risks (i.e. addiction risk, apnea etc) and complications (i.e. Somnolence, constipation and others) were explained to the patient and were aknowledged.

## 2020-02-27 NOTE — Progress Notes (Signed)
Subjective:  [name], female    DOB: 19-Jun-1946  Age: 74 y.o. MRN: [id number]  CC: No chief complaint on file.   HPI [employer] presents for painful ulcers on B shins - severe pain F/u on AF, HTN, CRF  Outpatient Medications Prior to Visit  Medication Sig Dispense Refill  . acetaminophen (TYLENOL) 500 MG tablet Take 500 mg by mouth every 6 (six) hours as needed for mild pain or headache.    Marland Kitchen amoxicillin (AMOXIL) 500 MG capsule TAKE 4 CAPSULES BY MOUTH 1 HOUR PRIOR TO DENTAL WORK    . clobetasol ointment (TEMOVATE) 1.94 % Apply 1 application topically 2 (two) times daily. 60 g 1  . dapsone 25 MG tablet Take 25 mg by mouth daily.     Marland Kitchen gabapentin (NEURONTIN) 300 MG capsule Take 1 capsule (300 mg total) by mouth 3 (three) times daily. 90 capsule 5  . hydrALAZINE (APRESOLINE) 25 MG tablet Take 25 mg by mouth 3 (three) times daily.    . insulin glargine, 1 Unit Dial, (TOUJEO SOLOSTAR) 300 UNIT/ML Solostar Pen Inject 60 Units into the skin every morning. Titrate up by 1 unit a day if needed for goal sugars of 100-130 up to 50 units a day max 3 pen 3  . insulin lispro (HUMALOG KWIKPEN) 200 UNIT/ML KwikPen Inject 10 Units into the skin with breakfast, with lunch, and with evening meal. 9 mL 1  . Insulin Pen Needle (B-D UF III MINI PEN NEEDLES) 31G X 5 MM MISC USE TO ADMINISTER INSULIN FOUR TIMES A DAY DX E11.9 100 each 1  . levothyroxine (SYNTHROID) 25 MCG tablet TAKE 2 TABLETS (50 MCG TOTAL) BY MOUTH DAILY. 180 tablet 3  . metoprolol tartrate (LOPRESSOR) 25 MG tablet Take 0.5 tablets (12.5 mg total) by mouth 2 (two) times daily. 90 tablet 3  . ONETOUCH DELICA LANCETS 17E MISC Use to check blood sugars three times a day DX E11.9 100 each 5  . ONETOUCH VERIO test strip USE TO TEST 3 TIMES DAILY. DX E11.9 100 strip 5  . oxyCODONE-acetaminophen (PERCOCET/ROXICET) 5-325 MG tablet Take 1 tablet by mouth every 8 (eight) hours as needed for severe pain. 60 tablet 0  . OXYGEN Inhale 3  L into the lungs as needed (for shortness of breath).     . predniSONE (DELTASONE) 5 MG tablet Take 1 tablet by mouth daily.    Marland Kitchen Propylene Glycol (SYSTANE BALANCE) 0.6 % SOLN Place 1-2 drops into both eyes 3 (three) times daily as needed (for dryness).    . repaglinide (PRANDIN) 0.5 MG tablet Take 0.5 mg by mouth 3 (three) times daily before meals.    . rosuvastatin (CRESTOR) 20 MG tablet Take 1 tablet (20 mg total) by mouth daily. (Patient taking differently: Take 20 mg by mouth every other day. ) 90 tablet 3  . tacrolimus (PROGRAF) 1 MG capsule Take 3 mg by mouth 2 (two) times daily.     Marland Kitchen torsemide (DEMADEX) 100 MG tablet Take 2 tablets (200 mg total) by mouth daily. 60 tablet 5  . triamcinolone cream (KENALOG) 0.1 % Apply 1 application topically 4 (four) times daily. 80 g 1  . warfarin (COUMADIN) 5 MG tablet TAKE 1/2 TO 1 TABLET BY MOUTH DAILY AS DIRECTED BY COUMADIN CLINIC 90 tablet 1  . potassium chloride SA (KLOR-CON M20) 20 MEQ tablet Take 1 tablet (20 mEq total) by mouth daily. (Patient taking differently: Take 40 mEq by mouth daily. ) 30 tablet 3  No facility-administered medications prior to visit.    ROS: Review of Systems  Constitutional: Positive for fatigue. Negative for activity change, appetite change, chills and unexpected weight change.  HENT: Negative for congestion, mouth sores and sinus pressure.   Eyes: Negative for visual disturbance.  Respiratory: Negative for cough and chest tightness.   Gastrointestinal: Negative for abdominal pain and nausea.  Genitourinary: Negative for difficulty urinating, frequency and vaginal pain.  Musculoskeletal: Positive for arthralgias, back pain and gait problem.  Skin: Positive for wound. Negative for pallor and rash.  Neurological: Negative for dizziness, tremors, weakness, numbness and headaches.  Psychiatric/Behavioral: Negative for confusion and sleep disturbance. The patient is nervous/anxious.     Objective:  BP (!) 110/54  (BP Location: Right Arm, Patient Position: Sitting, Cuff Size: Large)   Pulse 72   Temp 98.6 F (37 C) (Oral)   Ht 5' (1.524 m)   Wt 161 lb (73 kg)   SpO2 92%   BMI 31.44 kg/m   BP Readings from Last 3 Encounters:  02/27/20 (!) 110/54  02/25/20 122/80  01/31/20 (!) 156/60    Wt Readings from Last 3 Encounters:  02/27/20 161 lb (73 kg)  02/25/20 169 lb (76.7 kg)  02/22/20 160 lb (72.6 kg)    Physical Exam Constitutional:      General: She is not in acute distress.    Appearance: She is well-developed.  HENT:     Head: Normocephalic.     Right Ear: External ear normal.     Left Ear: External ear normal.     Nose: Nose normal.  Eyes:     General:        Right eye: No discharge.        Left eye: No discharge.     Conjunctiva/sclera: Conjunctivae normal.     Pupils: Pupils are equal, round, and reactive to light.  Neck:     Thyroid: No thyromegaly.     Vascular: No JVD.     Trachea: No tracheal deviation.  Cardiovascular:     Rate and Rhythm: Normal rate and regular rhythm.     Heart sounds: Normal heart sounds.  Pulmonary:     Effort: No respiratory distress.     Breath sounds: No stridor. No wheezing.  Abdominal:     General: Bowel sounds are normal. There is no distension.     Palpations: Abdomen is soft. There is no mass.     Tenderness: There is no abdominal tenderness. There is no guarding or rebound.  Musculoskeletal:        General: No tenderness.     Cervical back: Normal range of motion and neck supple.     Right lower leg: Edema present.     Left lower leg: Edema present.  Lymphadenopathy:     Cervical: No cervical adenopathy.  Skin:    Findings: Lesion present. No erythema or rash.  Neurological:     Cranial Nerves: No cranial nerve deficit.     Motor: Weakness present. No abnormal muscle tone.     Coordination: Coordination abnormal.     Gait: Gait abnormal.     Deep Tendon Reflexes: Reflexes normal.  Psychiatric:        Behavior: Behavior  normal.        Thought Content: Thought content normal.        Judgment: Judgment normal.    In a w/c 1 cm ulcers on anterior dist shins B  Lab Results  Component Value Date  WBC 6.3 12/14/2019   HGB 12.0 12/14/2019   HCT 39.3 12/14/2019   PLT 199 12/14/2019   GLUCOSE 357 (H) 01/31/2020   CHOL 155 03/24/2018   TRIG 91 03/24/2018   HDL 64 03/24/2018   LDLDIRECT 89.3 06/13/2006   LDLCALC 73 03/24/2018   ALT 19 01/31/2020   AST 18 01/31/2020   NA 141 01/31/2020   K 4.2 01/31/2020   CL 100 01/31/2020   CREATININE 2.96 (H) 01/31/2020   BUN 64 (H) 01/31/2020   CO2 25 01/31/2020   TSH 2.15 01/31/2020   INR 2.7 02/27/2020   HGBA1C 12.3 (H) 01/31/2020   MICROALBUR 317.3 (H) 07/02/2016    No results found.  Assessment & Plan:    Walker Kehr, MD

## 2020-02-27 NOTE — Assessment & Plan Note (Signed)
Oxycodone APAP prn  Potential benefits of a long term opioids use as well as potential risks (i.e. addiction risk, apnea etc) and complications (i.e. Somnolence, constipation and others) were explained to the patient and were aknowledged. Lido on ulcers prn

## 2020-02-27 NOTE — Assessment & Plan Note (Signed)
Elevate legs 

## 2020-02-29 ENCOUNTER — Encounter (HOSPITAL_BASED_OUTPATIENT_CLINIC_OR_DEPARTMENT_OTHER): Payer: Medicare Other | Attending: Internal Medicine | Admitting: Internal Medicine

## 2020-02-29 DIAGNOSIS — Z833 Family history of diabetes mellitus: Secondary | ICD-10-CM | POA: Insufficient documentation

## 2020-02-29 DIAGNOSIS — E11621 Type 2 diabetes mellitus with foot ulcer: Secondary | ICD-10-CM | POA: Insufficient documentation

## 2020-02-29 DIAGNOSIS — Z7901 Long term (current) use of anticoagulants: Secondary | ICD-10-CM | POA: Insufficient documentation

## 2020-02-29 DIAGNOSIS — I130 Hypertensive heart and chronic kidney disease with heart failure and stage 1 through stage 4 chronic kidney disease, or unspecified chronic kidney disease: Secondary | ICD-10-CM | POA: Insufficient documentation

## 2020-02-29 DIAGNOSIS — Z87891 Personal history of nicotine dependence: Secondary | ICD-10-CM | POA: Diagnosis not present

## 2020-02-29 DIAGNOSIS — Z886 Allergy status to analgesic agent status: Secondary | ICD-10-CM | POA: Insufficient documentation

## 2020-02-29 DIAGNOSIS — Z882 Allergy status to sulfonamides status: Secondary | ICD-10-CM | POA: Diagnosis not present

## 2020-02-29 DIAGNOSIS — Z923 Personal history of irradiation: Secondary | ICD-10-CM | POA: Insufficient documentation

## 2020-02-29 DIAGNOSIS — I350 Nonrheumatic aortic (valve) stenosis: Secondary | ICD-10-CM | POA: Insufficient documentation

## 2020-02-29 DIAGNOSIS — N184 Chronic kidney disease, stage 4 (severe): Secondary | ICD-10-CM | POA: Diagnosis not present

## 2020-02-29 DIAGNOSIS — Z881 Allergy status to other antibiotic agents status: Secondary | ICD-10-CM | POA: Diagnosis not present

## 2020-02-29 DIAGNOSIS — Z940 Kidney transplant status: Secondary | ICD-10-CM | POA: Diagnosis not present

## 2020-02-29 DIAGNOSIS — E1122 Type 2 diabetes mellitus with diabetic chronic kidney disease: Secondary | ICD-10-CM | POA: Diagnosis not present

## 2020-02-29 DIAGNOSIS — L97221 Non-pressure chronic ulcer of left calf limited to breakdown of skin: Secondary | ICD-10-CM | POA: Insufficient documentation

## 2020-02-29 DIAGNOSIS — Z885 Allergy status to narcotic agent status: Secondary | ICD-10-CM | POA: Diagnosis not present

## 2020-02-29 DIAGNOSIS — L97211 Non-pressure chronic ulcer of right calf limited to breakdown of skin: Secondary | ICD-10-CM | POA: Insufficient documentation

## 2020-02-29 DIAGNOSIS — Z8249 Family history of ischemic heart disease and other diseases of the circulatory system: Secondary | ICD-10-CM | POA: Insufficient documentation

## 2020-02-29 DIAGNOSIS — I252 Old myocardial infarction: Secondary | ICD-10-CM | POA: Insufficient documentation

## 2020-02-29 DIAGNOSIS — I5032 Chronic diastolic (congestive) heart failure: Secondary | ICD-10-CM | POA: Insufficient documentation

## 2020-02-29 DIAGNOSIS — J449 Chronic obstructive pulmonary disease, unspecified: Secondary | ICD-10-CM | POA: Diagnosis not present

## 2020-02-29 DIAGNOSIS — Z85118 Personal history of other malignant neoplasm of bronchus and lung: Secondary | ICD-10-CM | POA: Insufficient documentation

## 2020-02-29 DIAGNOSIS — Z794 Long term (current) use of insulin: Secondary | ICD-10-CM | POA: Insufficient documentation

## 2020-02-29 DIAGNOSIS — G4733 Obstructive sleep apnea (adult) (pediatric): Secondary | ICD-10-CM | POA: Diagnosis not present

## 2020-02-29 DIAGNOSIS — Z888 Allergy status to other drugs, medicaments and biological substances status: Secondary | ICD-10-CM | POA: Insufficient documentation

## 2020-02-29 DIAGNOSIS — I4891 Unspecified atrial fibrillation: Secondary | ICD-10-CM | POA: Diagnosis not present

## 2020-02-29 DIAGNOSIS — L97821 Non-pressure chronic ulcer of other part of left lower leg limited to breakdown of skin: Secondary | ICD-10-CM | POA: Diagnosis not present

## 2020-03-03 ENCOUNTER — Ambulatory Visit
Admission: RE | Admit: 2020-03-03 | Discharge: 2020-03-03 | Disposition: A | Discharge status: Home / Self Care | Payer: Medicare Other | Source: Ambulatory Visit | Attending: Radiation Oncology | Admitting: Radiation Oncology

## 2020-03-04 ENCOUNTER — Encounter (HOSPITAL_BASED_OUTPATIENT_CLINIC_OR_DEPARTMENT_OTHER): Payer: Medicare Other | Admitting: Internal Medicine

## 2020-03-05 ENCOUNTER — Telehealth: Payer: Self-pay

## 2020-03-05 MED ORDER — AMOXICILLIN 500 MG PO CAPS: ORAL_CAPSULE | ORAL | 1 refills | Status: DC

## 2020-03-05 NOTE — Telephone Encounter (Signed)
Contacted [facility] specialty pharmacy and notified them that a prescription for torsemide was sent to [facility] in June and they will get the prescription from them. I have sent in amoxicillin

## 2020-03-05 NOTE — Telephone Encounter (Signed)
Fax request sent over last Friday   1.Medication Requested: torsemide (DEMADEX) 100 MG tablet  amoxicillin (AMOXIL) 500 MG capsule  2. Pharmacy (Name, Street, City):[facility] [place], [place] [address]  3. On Med List: [facility]   4. Last Visit with PCP: 8.11.21   5. Next visit date with PCP: N/A   Agent: Please be advised that RX refills may take up to 3 business days. We ask that you follow-up with your pharmacy.

## 2020-03-06 ENCOUNTER — Ambulatory Visit (INDEPENDENT_AMBULATORY_CARE_PROVIDER_SITE_OTHER): Payer: Medicare Other | Admitting: Cardiology

## 2020-03-06 ENCOUNTER — Telehealth: Payer: Self-pay | Admitting: Cardiology

## 2020-03-06 ENCOUNTER — Ambulatory Visit (HOSPITAL_COMMUNITY): Payer: Medicare Other

## 2020-03-06 DIAGNOSIS — Z5181 Encounter for therapeutic drug level monitoring: Secondary | ICD-10-CM | POA: Diagnosis not present

## 2020-03-06 DIAGNOSIS — I5033 Acute on chronic diastolic (congestive) heart failure: Secondary | ICD-10-CM | POA: Diagnosis not present

## 2020-03-06 DIAGNOSIS — I480 Paroxysmal atrial fibrillation: Secondary | ICD-10-CM | POA: Diagnosis not present

## 2020-03-06 DIAGNOSIS — I152 Hypertension secondary to endocrine disorders: Secondary | ICD-10-CM | POA: Diagnosis not present

## 2020-03-06 DIAGNOSIS — E1122 Type 2 diabetes mellitus with diabetic chronic kidney disease: Secondary | ICD-10-CM | POA: Diagnosis not present

## 2020-03-06 DIAGNOSIS — I4819 Other persistent atrial fibrillation: Secondary | ICD-10-CM | POA: Diagnosis not present

## 2020-03-06 DIAGNOSIS — C3411 Malignant neoplasm of upper lobe, right bronchus or lung: Secondary | ICD-10-CM | POA: Diagnosis not present

## 2020-03-06 DIAGNOSIS — I2510 Atherosclerotic heart disease of native coronary artery without angina pectoris: Secondary | ICD-10-CM | POA: Diagnosis not present

## 2020-03-06 LAB — POCT INR: INR: 1.6 — AB (ref 2.0–3.0)

## 2020-03-06 NOTE — Telephone Encounter (Signed)
See anticoag note

## 2020-03-06 NOTE — Telephone Encounter (Signed)
[name] with [facility] is calling to give  Critical INR.

## 2020-03-10 ENCOUNTER — Other Ambulatory Visit: Payer: Self-pay

## 2020-03-10 ENCOUNTER — Ambulatory Visit
Admission: RE | Admit: 2020-03-10 | Discharge: 2020-03-10 | Disposition: A | Discharge status: Home / Self Care | Payer: Medicare Other | Source: Ambulatory Visit | Attending: Radiation Oncology | Admitting: Radiation Oncology

## 2020-03-10 ENCOUNTER — Emergency Department (HOSPITAL_COMMUNITY)
Admission: EM | Admit: 2020-03-10 | Discharge: 2020-03-10 | Disposition: A | Discharge status: Home / Self Care | Payer: Medicare Other | Attending: Emergency Medicine | Admitting: Emergency Medicine

## 2020-03-10 ENCOUNTER — Emergency Department (HOSPITAL_BASED_OUTPATIENT_CLINIC_OR_DEPARTMENT_OTHER): Payer: Medicare Other

## 2020-03-10 ENCOUNTER — Encounter (HOSPITAL_COMMUNITY): Payer: Self-pay

## 2020-03-10 DIAGNOSIS — Y929 Unspecified place or not applicable: Secondary | ICD-10-CM | POA: Diagnosis not present

## 2020-03-10 DIAGNOSIS — J449 Chronic obstructive pulmonary disease, unspecified: Secondary | ICD-10-CM | POA: Diagnosis not present

## 2020-03-10 DIAGNOSIS — I132 Hypertensive heart and chronic kidney disease with heart failure and with stage 5 chronic kidney disease, or end stage renal disease: Secondary | ICD-10-CM | POA: Diagnosis not present

## 2020-03-10 DIAGNOSIS — Z85118 Personal history of other malignant neoplasm of bronchus and lung: Secondary | ICD-10-CM | POA: Diagnosis not present

## 2020-03-10 DIAGNOSIS — Y999 Unspecified external cause status: Secondary | ICD-10-CM | POA: Insufficient documentation

## 2020-03-10 DIAGNOSIS — M79605 Pain in left leg: Secondary | ICD-10-CM

## 2020-03-10 DIAGNOSIS — E1122 Type 2 diabetes mellitus with diabetic chronic kidney disease: Secondary | ICD-10-CM | POA: Insufficient documentation

## 2020-03-10 DIAGNOSIS — L0390 Cellulitis, unspecified: Secondary | ICD-10-CM

## 2020-03-10 DIAGNOSIS — Z95828 Presence of other vascular implants and grafts: Secondary | ICD-10-CM | POA: Insufficient documentation

## 2020-03-10 DIAGNOSIS — Z85528 Personal history of other malignant neoplasm of kidney: Secondary | ICD-10-CM | POA: Diagnosis not present

## 2020-03-10 DIAGNOSIS — Z86010 Personal history of colonic polyps: Secondary | ICD-10-CM | POA: Diagnosis not present

## 2020-03-10 DIAGNOSIS — Y939 Activity, unspecified: Secondary | ICD-10-CM | POA: Insufficient documentation

## 2020-03-10 DIAGNOSIS — N186 End stage renal disease: Secondary | ICD-10-CM | POA: Insufficient documentation

## 2020-03-10 DIAGNOSIS — S81802A Unspecified open wound, left lower leg, initial encounter: Secondary | ICD-10-CM | POA: Insufficient documentation

## 2020-03-10 DIAGNOSIS — S81801A Unspecified open wound, right lower leg, initial encounter: Secondary | ICD-10-CM | POA: Insufficient documentation

## 2020-03-10 DIAGNOSIS — I5033 Acute on chronic diastolic (congestive) heart failure: Secondary | ICD-10-CM | POA: Diagnosis not present

## 2020-03-10 DIAGNOSIS — Z87891 Personal history of nicotine dependence: Secondary | ICD-10-CM | POA: Diagnosis not present

## 2020-03-10 DIAGNOSIS — S81809A Unspecified open wound, unspecified lower leg, initial encounter: Secondary | ICD-10-CM

## 2020-03-10 DIAGNOSIS — I151 Hypertension secondary to other renal disorders: Secondary | ICD-10-CM | POA: Diagnosis not present

## 2020-03-10 DIAGNOSIS — Z9861 Coronary angioplasty status: Secondary | ICD-10-CM | POA: Insufficient documentation

## 2020-03-10 DIAGNOSIS — X58XXXA Exposure to other specified factors, initial encounter: Secondary | ICD-10-CM | POA: Insufficient documentation

## 2020-03-10 LAB — CBC WITH DIFFERENTIAL/PLATELET
Abs Immature Granulocytes: 0.09 10*3/uL — ABNORMAL HIGH (ref 0.00–0.07)
Basophils Absolute: 0 10*3/uL (ref 0.0–0.1)
Basophils Relative: 0 %
Eosinophils Absolute: 0 10*3/uL (ref 0.0–0.5)
Eosinophils Relative: 0 %
HCT: 39.5 % (ref 36.0–46.0)
Hemoglobin: 12.1 g/dL (ref 12.0–15.0)
Immature Granulocytes: 1 %
Lymphocytes Relative: 5 %
Lymphs Abs: 0.5 10*3/uL — ABNORMAL LOW (ref 0.7–4.0)
MCH: 25.4 pg — ABNORMAL LOW (ref 26.0–34.0)
MCHC: 30.6 g/dL (ref 30.0–36.0)
MCV: 82.8 fL (ref 80.0–100.0)
Monocytes Absolute: 0.6 10*3/uL (ref 0.1–1.0)
Monocytes Relative: 6 %
Neutro Abs: 8.7 10*3/uL — ABNORMAL HIGH (ref 1.7–7.7)
Neutrophils Relative %: 88 %
Platelets: 275 10*3/uL (ref 150–400)
RBC: 4.77 MIL/uL (ref 3.87–5.11)
RDW: 18.3 % — ABNORMAL HIGH (ref 11.5–15.5)
WBC: 9.9 10*3/uL (ref 4.0–10.5)
nRBC: 0 % (ref 0.0–0.2)

## 2020-03-10 LAB — COMPREHENSIVE METABOLIC PANEL
ALT: 21 U/L (ref 0–44)
AST: 20 U/L (ref 15–41)
Albumin: 2.7 g/dL — ABNORMAL LOW (ref 3.5–5.0)
Alkaline Phosphatase: 277 U/L — ABNORMAL HIGH (ref 38–126)
Anion gap: 12 (ref 5–15)
BUN: 46 mg/dL — ABNORMAL HIGH (ref 8–23)
CO2: 25 mmol/L (ref 22–32)
Calcium: 9.5 mg/dL (ref 8.9–10.3)
Chloride: 91 mmol/L — ABNORMAL LOW (ref 98–111)
Creatinine, Ser: 2.9 mg/dL — ABNORMAL HIGH (ref 0.44–1.00)
GFR calc Af Amer: 17 mL/min — ABNORMAL LOW (ref >60)
GFR calc non Af Amer: 15 mL/min — ABNORMAL LOW (ref >60)
Glucose, Bld: 708 mg/dL (ref 70–99)
Potassium: 4.1 mmol/L (ref 3.5–5.1)
Sodium: 128 mmol/L — ABNORMAL LOW (ref 135–145)
Total Bilirubin: 0.6 mg/dL (ref 0.3–1.2)
Total Protein: 7 g/dL (ref 6.5–8.1)

## 2020-03-10 LAB — PROTIME-INR
INR: 2.4 — ABNORMAL HIGH (ref 0.8–1.2)
Prothrombin Time: 25.2 seconds — ABNORMAL HIGH (ref 11.4–15.2)

## 2020-03-10 LAB — CBG MONITORING, ED
Glucose-Capillary: 550 mg/dL (ref 70–99)
Glucose-Capillary: 327 mg/dL — ABNORMAL HIGH (ref 70–99)

## 2020-03-10 LAB — LACTIC ACID, PLASMA: Lactic Acid, Venous: 1 mmol/L (ref 0.5–1.9)

## 2020-03-10 MED ORDER — OXYCODONE HCL 5 MG PO TABS
5.0000 mg | ORAL_TABLET | Freq: Once | ORAL | Status: AC
  Administered 2020-03-10: 5 mg via ORAL
  Filled 2020-03-10: qty 1

## 2020-03-10 MED ORDER — INSULIN ASPART 100 UNIT/ML ~~LOC~~ SOLN
10.0000 [IU] | Freq: Once | SUBCUTANEOUS | Status: AC
  Administered 2020-03-10: 10 [IU] via SUBCUTANEOUS

## 2020-03-10 MED ORDER — CEPHALEXIN 500 MG PO CAPS: 500.0000 mg | ORAL_CAPSULE | Freq: Four times a day (QID) | ORAL | 0 refills | Status: DC

## 2020-03-10 MED ORDER — INSULIN GLARGINE 100 UNIT/ML ~~LOC~~ SOLN
60.0000 [IU] | Freq: Once | SUBCUTANEOUS | Status: AC
  Administered 2020-03-10: 60 [IU] via SUBCUTANEOUS
  Filled 2020-03-10: qty 0.6

## 2020-03-10 MED ORDER — CEPHALEXIN 250 MG PO CAPS
1000.0000 mg | ORAL_CAPSULE | Freq: Once | ORAL | Status: AC
  Administered 2020-03-10: 1000 mg via ORAL
  Filled 2020-03-10: qty 4

## 2020-03-10 MED ORDER — ONDANSETRON 4 MG PO TBDP
4.0000 mg | ORAL_TABLET | Freq: Once | ORAL | Status: AC
  Administered 2020-03-10: 4 mg via ORAL
  Filled 2020-03-10: qty 1

## 2020-03-10 NOTE — Discharge Instructions (Addendum)
You were given an antibiotic to help treat a possible infection in your wounds.  You are also given pain medications to take for your leg pain.  Please take as directed.  Do not drive, operate machinery or drink alcohol while taking this medication as it can make you very sleepy.  It can also cause constipation so if you find that you are getting constipated you may buy an over-the-counter laxative.  Your INR today was noted to be at goal today at 2.4.   You were given information to follow-up with a vascular surgeon in regards to your leg pain today.  The office should be reaching out to you to schedule an appointment on Wednesday or Thursday however if you do not hear from them he should call them and schedule an appointment.  If you have any new or worsening symptoms including any fevers, increased redness/swelling or pain to the legs or any other concerns you should return to the emergency department immediately.

## 2020-03-10 NOTE — ED Notes (Signed)
Called to update vitals and no response

## 2020-03-10 NOTE — ED Triage Notes (Signed)
Patient complains of 2 months of bilateral lower leg weeping and drainage with swelling and redness. Has seen primary and wound specialist with no resolution. Alert and oriented

## 2020-03-10 NOTE — ED Notes (Signed)
Called pt x2 for vitals, no response. °

## 2020-03-10 NOTE — ED Notes (Signed)
Pt verbalized understanding of discharge instructions. Follow up care, prescriptions, and pain management reviewed, pt had no further questions. Brought to lobby via wheelchair.

## 2020-03-10 NOTE — Progress Notes (Signed)
ABI w/ TBI study completed  Preliminary results relayed to MD and PA.   See CV Proc for preliminary results report.   [name]

## 2020-03-10 NOTE — ED Provider Notes (Signed)
[facility] EMERGENCY DEPARTMENT Provider Note   CSN: [id number] Arrival date & time: 03/10/20  0631     History Chief Complaint  Patient presents with  . Leg Pain    [name] is a 74 y.o. female.  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 fib, ESRD s/p renal transplant, gout, H. pylori, hyperlipidemia, hypertension, diabetes, who presents emergency department today for evaluation of bilateral lower extremity pain.  Patient has a wound to the right and left leg that she has been following with wound care about.  She states that on 8/13 she was seen at wound care and her legs were wrapped from her feet "all the way to my groin." States that the wound dressings were so tight they were causing her significant pain so she took them off at night and they have been off ever since.  The following day she experienced significant pain to the bilateral lower extremities, left greater than right.  She experienced discoloration to the posterior aspect of the left lower extremity.  She denies any fevers or other systemic symptoms.  She denies any drainage of pus from the wound but has had some clear drainage.  She has some peripheral neuropathy as well to the bilateral lower extremities.  Past Medical History:  Diagnosis Date  . Acute CHF (congestive heart failure) ([place]) 12/2018  . Adenocarcinoma of lung, stage 1, right ([place]) 11/25/2017  . Anemia, iron deficiency    of chronic disease  . Aortic stenosis    a. Severe AS by echo 11/2012.  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 Asystole ([place])    a. During ENT surgery 2005: developed marked asystole requiring CPR, felt due to vagal reaction (cath nonobst dz).  . Carotid artery disease ([place])    a. Carotid Dopplers performed in August 2013 showed 40-59% left stenosis and  0-39% right; f/u recommended in 2 years.   . Cerebrovascular accident [facility]) 2009   a. LMCA infarct felt embolic 3267, maintained on chronic coumadin.; denies residual on 04/05/2013  . Cholelithiasis   . Chronic Persistent Atrial Fibrillation 12/31/2008   Qualifier: Diagnosis of  By: [name]    . Coronary artery disease 05/2002   a. Ant MI 2003 s/p PTCA/stent to RCA.   . Diverticulosis of colon   . Esophagitis, reflux   . ESRD (end stage renal disease) ([place])    a. Mass on L kidney per pt s/p nephrectomy - pt states not cancer - [facility] notes indicate ESRD due to HTN/DM - was previously on HD. b. Kidney transplant 02/2011.  Marland Kitchen GERD (gastroesophageal reflux disease)   . Gout   . Hearing loss   . Helicobacter pylori (H. pylori) infection    hx of  . Hemorrhoids   . Hx of colonic polyps    adenomatous  . Hyperlipidemia   . Hypertension   . Lung nodule seen on imaging study 04/07/2013   1.0 cm ground glass opacity RUL  . Myocardial infarction ([place]) 2003  . Pericardial effusion    a. Small by echo 11/2011.  . S/P aortic valve replacement with bioprosthetic valve and maze procedure 04/12/2013   65mm [facility] Ease bovine pericardial tissue valve   . S/P Maze operation for atrial fibrillation 04/12/2013   Complete bilateral atrial lesion set using bipolar radiofrequency and cryothermy  ablation with clipping of LA appendage  . Sleep apnea    Pt says testing was positive, intolerant of CPAP.  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  . Stroke ([place])   . Type II diabetes mellitus [facility])     Patient Active Problem List   Diagnosis Date Noted  . ESRD (end stage renal disease) ([place])   . Venous stasis ulcers of both lower extremities ([place]) 01/31/2020  . Venous stasis dermatitis of both lower extremities 01/31/2020  . Claudication  of both lower extremities ([place]) 01/31/2020  . Chronic venous insufficiency 12/31/2019  . Heart failure ([place]) 10/02/2019  . Dyspnea 08/20/2019  . COPD (chronic obstructive pulmonary disease) ([place]) 08/20/2019  . Hematoma of arm, left, subsequent encounter 07/23/2019  . Obesity (BMI 30-39.9) 12/20/2018  . Acute respiratory failure with hypoxia ([place]) 12/19/2018  . Gait disorder 11/22/2018  . History of seizure disorder 09/12/2018  . History of stroke 09/12/2018  . Edema 08/28/2018  . Hypertension secondary to other renal disorders 03/22/2018  . Proteinuria 03/22/2018  . Malignant neoplasm of lung ([place]) 03/22/2018  . Breast pain in female 02/04/2018  . Lung cancer ([place]) 11/30/2017  . Adenocarcinoma of lung, stage 1, right ([place]) 11/25/2017  . Chronic respiratory failure with hypoxia ([place]) 12/27/2016  . Acute on chronic diastolic CHF (congestive heart failure) ([place]) 12/27/2016  . Left upper extremity swelling 12/27/2016  . Pain of left breast 12/27/2016  . Arm muscle atrophy 11/16/2016  . Insomnia 02/25/2016  . Renal cyst 11/21/2015  . Renal mass, right 11/10/2015  . Shoulder pain, right 08/13/2015  . Weight gain 08/12/2014  . Multinodular goiter 05/01/2014  . Encounter for therapeutic drug monitoring 08/14/2013  . S/P aortic valve replacement with bioprosthetic valve and maze procedure 04/12/2013  . Nodule of right lung 04/07/2013  . Nodule of left lung 04/07/2013  . Pain in limb 05/22/2012  . Dermatitis 01/26/2012  . Long term (current) use of anticoagulants 08/17/2011  . Anemia in chronic renal disease 05/13/2011  . Hypokalemia 04/26/2011  . Immunosuppression ([place]) 04/26/2011  . Hypertension associated with diabetes ([place]) 04/26/2011  . Renal transplant recipient 03/16/2011  . Low back pain 02/23/2011  . Hypersalivation 11/24/2010  . AORTIC STENOSIS 08/20/2010  . DISTURBANCE OF SALIVARY SECRETION 07/28/2010  . NAUSEA 04/14/2010  . SMOKER 10/07/2009  . ALOPECIA 10/07/2009  .  CAROTID STENOSIS 03/04/2009  . AF (paroxysmal atrial fibrillation) ([place]) 12/31/2008  . NEOPLASM, MALIGNANT, KIDNEY 07/02/2008  . APHASIA DUE TO CEREBROVASCULAR DISEASE 07/02/2008  . Chronic fatigue 05/21/2008  . Hyperlipidemia LDL goal <70 09/27/2007  . Constipation 09/27/2007  . SLEEP APNEA 09/27/2007  . CHOLELITHIASIS 06/09/2007  . HELICOBACTER PYLORI INFECTION, HX OF 06/08/2007  . Type 2 diabetes mellitus with renal manifestations ([place]) 05/23/2007  . Gout 05/23/2007  . 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 MYOCARDIAL INFARCTION, HX OF 05/23/2007  . Coronary atherosclerosis 05/23/2007  . GERD 05/23/2007  . COLONIC POLYPS, HX OF 05/23/2007  . DIVERTICULOSIS, COLON 07/01/2005    Past Surgical History:  Procedure Laterality Date  . AORTIC VALVE REPLACEMENT N/A 04/12/2013   Procedure: AORTIC VALVE REPLACEMENT (AVR);  Surgeon: Rexene Alberts, MD;  Location: [place];  Service: Open Heart Surgery;  Laterality: N/A;  . ARTERIOVENOUS GRAFT PLACEMENT Left   . ARTERIOVENOUS GRAFT PLACEMENT Left    "I've had 2 on my left; had  one removed" (04/05/2013)   . ARTERY EXPLORATION Right 04/11/2013   Procedure: ARTERY EXPLORATION;  Surgeon: Rexene Alberts, MD;  Location: [facility];  Service: Open Heart Surgery;  Laterality: Right;  Right carotid artery exploration  . AV FISTULA PLACEMENT Right   . AV FISTULA REPAIR Right    "took it out" ((/18/2014)  . CARDIOVERSION  05/29/2012   Procedure: CARDIOVERSION;  Surgeon: Lelon Perla, MD;  Location: [facility] ENDOSCOPY;  Service: Cardiovascular;  Laterality: N/A;  . CHOLECYSTECTOMY  2009   with hernia removal  . CORONARY ANGIOPLASTY WITH STENT PLACEMENT Right    coronary artery  . INSERTION OF DIALYSIS CATHETER Bilateral    "over the years; took them both out" (04/05/2013)  . INTRAOPERATIVE TRANSESOPHAGEAL ECHOCARDIOGRAM N/A 04/11/2013   Procedure: INTRAOPERATIVE TRANSESOPHAGEAL ECHOCARDIOGRAM;  Surgeon: Rexene Alberts, MD;  Location: [place];   Service: Open Heart Surgery;  Laterality: N/A;  . INTRAOPERATIVE TRANSESOPHAGEAL ECHOCARDIOGRAM N/A 04/12/2013   Procedure: INTRAOPERATIVE TRANSESOPHAGEAL ECHOCARDIOGRAM;  Surgeon: Rexene Alberts, MD;  Location: [place];  Service: Open Heart Surgery;  Laterality: N/A;  . KIDNEY TRANSPLANT  03/16/11  . LEFT AND RIGHT HEART CATHETERIZATION WITH CORONARY ANGIOGRAM N/A 04/06/2013   Procedure: LEFT AND RIGHT HEART CATHETERIZATION WITH CORONARY ANGIOGRAM;  Surgeon: Blane Ohara, MD;  Location: [facility] CATH LAB;  Service: Cardiovascular;  Laterality: N/A;  . MAZE N/A 04/12/2013   Procedure: MAZE;  Surgeon: Rexene Alberts, MD;  Location: [place];  Service: Open Heart Surgery;  Laterality: N/A;  . NASAL RECONSTRUCTION WITH SEPTAL REPAIR     "took it out" (04/05/2013)  . NEPHRECTOMY Left 2010   no CA on bx  . TONSILLECTOMY    . TOTAL ABDOMINAL HYSTERECTOMY    . TUBAL LIGATION       OB History   No obstetric history on file.     Family History  Problem Relation Age of Onset  . Stroke Father   . Hypertension Mother   . Diabetes Other   . Diabetes Maternal Grandmother   . Diabetes Son   . Crohn's disease Other        grandson   . Breast cancer Neg Hx   . Stomach cancer Neg Hx   . Esophageal cancer Neg Hx   . Colon cancer Neg Hx   . Pancreatic cancer Neg Hx     Social History   Tobacco Use  . Smoking status: Former Smoker    Packs/day: 1.00    Years: 30.00    Pack years: 30.00    Types: Cigarettes    Quit date: 07/19/2001    Years since quitting: 18.6  . Smokeless tobacco: Never Used  Vaping Use  . Vaping Use: Never used  Substance Use Topics  . Alcohol use: No  . Drug use: No    Home Medications Prior to Admission medications   Medication Sig Start Date End Date Taking? Authorizing Provider  acetaminophen (TYLENOL) 500 MG tablet Take 500 mg by mouth every 6 (six) hours as needed for mild pain or headache.    [provider]  amoxicillin (AMOXIL) 500 MG capsule TAKE 4  CAPSULES BY MOUTH 1 HOUR PRIOR TO DENTAL WORK 03/05/20   Plotnikov, [name], MD  cephALEXin (KEFLEX) 500 MG capsule Take 1 capsule (500 mg total) by mouth 4 (four) times daily for 7 days. 03/10/20 03/17/20  Shabria Egley S, PA-C  clobetasol ointment (TEMOVATE) 2.50 % Apply 1 application topically 2 (two) times daily. 01/31/20  Janith Lima, MD  dapsone 25 MG tablet Take 25 mg by mouth daily.     [provider]  gabapentin (NEURONTIN) 300 MG capsule Take 1 capsule (300 mg total) by mouth 3 (three) times daily. 10/22/19   Plotnikov, [name], MD  hydrALAZINE (APRESOLINE) 25 MG tablet Take 25 mg by mouth 3 (three) times daily. 12/03/19   [provider]  insulin glargine, 1 Unit Dial, (TOUJEO SOLOSTAR) 300 UNIT/ML Solostar Pen Inject 60 Units into the skin every morning. Titrate up by 1 unit a day if needed for goal sugars of 100-130 up to 50 units a day max 02/01/20   Janith Lima, MD  insulin lispro (HUMALOG KWIKPEN) 200 UNIT/ML KwikPen Inject 10 Units into the skin with breakfast, with lunch, and with evening meal. 02/01/20   Janith Lima, MD  Insulin Pen Needle (B-D UF III MINI PEN NEEDLES) 31G X 5 MM MISC USE TO ADMINISTER INSULIN FOUR TIMES A DAY DX E11.9 11/11/17   Plotnikov, [name], MD  levothyroxine (SYNTHROID) 25 MCG tablet TAKE 2 TABLETS (50 MCG TOTAL) BY MOUTH DAILY. 01/25/20   Plotnikov, [name], MD  lidocaine (XYLOCAINE) 5 % ointment Apply 1 application topically 4 (four) times daily as needed. 02/27/20   Plotnikov, [name], MD  metoprolol tartrate (LOPRESSOR) 25 MG tablet Take 0.5 tablets (12.5 mg total) by mouth 2 (two) times daily. 08/22/19   Sherran Needs, NP  [facility] DELICA LANCETS 03T MISC Use to check blood sugars three times a day DX E11.9 10/12/17   Plotnikov, [name], MD  ONETOUCH VERIO test strip USE TO TEST 3 TIMES DAILY. DX E11.9 11/27/19   Plotnikov, [name], MD  oxyCODONE-acetaminophen (PERCOCET/ROXICET) 5-325 MG tablet Take 1 tablet by mouth  every 8 (eight) hours as needed for severe pain. 02/27/20   Plotnikov, [name], MD  OXYGEN Inhale 3 L into the lungs as needed (for shortness of breath).     [provider]  potassium chloride SA (KLOR-CON M20) 20 MEQ tablet Take 1 tablet (20 mEq total) by mouth daily. Patient taking differently: Take 40 mEq by mouth daily.  09/19/19 12/18/19  Deberah Pelton, NP  predniSONE (DELTASONE) 5 MG tablet Take 1 tablet by mouth daily. 03/29/19 08/19/20  [provider]  Propylene Glycol (SYSTANE BALANCE) 0.6 % SOLN Place 1-2 drops into both eyes 3 (three) times daily as needed (for dryness).    [provider]  repaglinide (PRANDIN) 0.5 MG tablet Take 0.5 mg by mouth 3 (three) times daily before meals.    [provider]  rosuvastatin (CRESTOR) 20 MG tablet Take 1 tablet (20 mg total) by mouth daily. Patient taking differently: Take 20 mg by mouth every other day.  03/21/19   Lelon Perla, MD  tacrolimus (PROGRAF) 1 MG capsule Take 3 mg by mouth 2 (two) times daily.     [provider]  torsemide (DEMADEX) 100 MG tablet Take 2 tablets (200 mg total) by mouth daily. 12/31/19   Plotnikov, [name], MD  triamcinolone cream (KENALOG) 0.1 % Apply 1 application topically 4 (four) times daily. 12/05/19   Plotnikov, [name], MD  warfarin (COUMADIN) 5 MG tablet TAKE 1/2 TO 1 TABLET BY MOUTH DAILY AS DIRECTED BY COUMADIN CLINIC 11/16/19   Lelon Perla, MD    Allergies    Ibuprofen, Sulfamethoxazole-trimethoprim, Sulfonamide derivatives, Tape, Tramadol, Doxycycline, Hydrocil [psyllium], Bactrim, and Red dye  Review of Systems   Review of Systems  Constitutional: Negative for fever.  HENT: Negative for ear pain and sore throat.   Eyes: Negative for visual disturbance.  Respiratory: Negative for cough and shortness of breath.   Cardiovascular: Positive for leg swelling. Negative for chest pain.  Gastrointestinal: Negative for abdominal pain, constipation,  diarrhea, nausea and vomiting.  Genitourinary: Negative for dysuria and hematuria.  Musculoskeletal:       BLE pain  Skin: Positive for color change and wound.  Neurological: Negative for headaches.  All other systems reviewed and are negative.   Physical Exam Updated Vital Signs BP (!) 183/113 (BP Location: Left Arm)   Pulse 87   Temp 98.5 F (36.9 C) (Oral)   Resp 19   SpO2 100%   Physical Exam Vitals and nursing note reviewed.  Constitutional:      General: She is not in acute distress.    Appearance: She is well-developed.  HENT:     Head: Normocephalic and atraumatic.  Eyes:     Conjunctiva/sclera: Conjunctivae normal.  Cardiovascular:     Pulses: Normal pulses.     Heart sounds: Normal heart sounds. No murmur heard.      Comments: Irregularly irregular Pulmonary:     Effort: Pulmonary effort is normal. No respiratory distress.     Breath sounds: Normal breath sounds. No wheezing, rhonchi or rales.  Abdominal:     General: Bowel sounds are normal.     Palpations: Abdomen is soft.     Tenderness: There is no abdominal tenderness. There is no guarding or rebound.  Musculoskeletal:     Cervical back: Neck supple.     Right lower leg: Edema present.     Left lower leg: Edema present.  Skin:    General: Skin is warm and dry.  Neurological:     Mental Status: She is alert.       Posterior aspect of LLE (above)    Anterior aspect of LLE (above)   Wound to RLE (above)    ED Results / Procedures / Treatments   Labs (all labs ordered are listed, but only abnormal results are displayed) Labs Reviewed  COMPREHENSIVE METABOLIC PANEL - Abnormal; Notable for the following components:      Result Value   Sodium 128 (*)    Chloride 91 (*)    Glucose, Bld 708 (*)    BUN 46 (*)    Creatinine, Ser 2.90 (*)    Albumin 2.7 (*)    Alkaline Phosphatase 277 (*)    GFR calc non Af Amer 15 (*)    GFR calc Af Amer 17 (*)    All other components within normal  limits  CBC WITH DIFFERENTIAL/PLATELET - Abnormal; Notable for the following components:   MCH 25.4 (*)    RDW 18.3 (*)    Neutro Abs 8.7 (*)    Lymphs Abs 0.5 (*)    Abs Immature Granulocytes 0.09 (*)    All other components within normal limits  PROTIME-INR - Abnormal; Notable for the following components:   Prothrombin Time 25.2 (*)    INR 2.4 (*)    All other components within normal limits  CBG MONITORING, ED - Abnormal; Notable for the following components:   Glucose-Capillary 550 (*)    All other components within normal limits  CBG MONITORING, ED - Abnormal; Notable for the following components:   Glucose-Capillary 327 (*)    All other components within normal limits  LACTIC ACID, PLASMA  CBG MONITORING, ED  CBG MONITORING, ED  EKG None  Radiology VAS [place] ABI WITH/WO TBI  Result Date: 03/10/2020 LOWER EXTREMITY DOPPLER STUDY Indications: Ulceration. High Risk Factors: Hyperlipidemia, Diabetes.  Comparison Study: Prior study 05-06-2017 RT 0.65/0.31 LT 0.68/0.44 Performing Technologist: [name]  Examination Guidelines: A complete evaluation includes at minimum, Doppler waveform signals and systolic blood pressure reading at the level of bilateral brachial, anterior tibial, and posterior tibial arteries, when vessel segments are accessible. Bilateral testing is considered an integral part of a complete examination. Photoelectric Plethysmograph (PPG) waveforms and toe systolic pressure readings are included as required and additional duplex testing as needed. Limited examinations for reoccurring indications may be performed as noted.  ABI Findings: +---------+------------------+-----+-------------------+--------+ Right    Rt Pressure (mmHg)IndexWaveform           Comment  +---------+------------------+-----+-------------------+--------+ Brachial 161                    triphasic                   +---------+------------------+-----+-------------------+--------+ PTA       255               1.58 monophasic                  +---------+------------------+-----+-------------------+--------+ DP       255               1.58 dampened monophasic         +---------+------------------+-----+-------------------+--------+ Great Toe                       Absent                      +---------+------------------+-----+-------------------+--------+ +---------+------------------+-----+----------+--------------------------------+ Left     Lt Pressure (mmHg)IndexWaveform  Comment                          +---------+------------------+-----+----------+--------------------------------+ Brachial                                  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                      +---------+------------------+-----+----------+--------------------------------+ PTA      255               1.58 monophasic                                 +---------+------------------+-----+----------+--------------------------------+ DP       137               0.85 monophasic                                 +---------+------------------+-----+----------+--------------------------------+ Great Toe                       Absent                                     +---------+------------------+-----+----------+--------------------------------+ +-------+-----------+-----------+------------+------------+  ABI/TBIToday's ABIToday's TBIPrevious ABIPrevious TBI +-------+-----------+-----------+------------+------------+ Right  [place]                                             +-------+-----------+-----------+------------+------------+ Left   0.85                                           +-------+-----------+-----------+------------+------------+ Arterial wall calcification precludes accurate ankle pressures and ABIs.  Summary: Right: Resting right ankle-brachial index indicates noncompressible right lower  extremity arteries. ABIs are unreliable. Toe pressures absent. Left: Resting left ankle-brachial index indicates mild left lower extremity arterial disease. ABIs are unreliable. Toe pressures absent.  *See table(s) above for measurements and observations.     Preliminary     Procedures Procedures (including critical care time)  Medications Ordered in ED Medications  insulin glargine (LANTUS) injection 60 Units (60 Units Subcutaneous Given 03/10/20 1817)  insulin aspart (novoLOG) injection 10 Units (10 Units Subcutaneous Given 03/10/20 1734)  oxyCODONE (Oxy IR/ROXICODONE) immediate release tablet 5 mg (5 mg Oral Given 03/10/20 1732)  ondansetron (ZOFRAN-ODT) disintegrating tablet 4 mg (4 mg Oral Given 03/10/20 1732)  cephALEXin (KEFLEX) capsule 1,000 mg (1,000 mg Oral Given 03/10/20 2030)  oxyCODONE (Oxy IR/ROXICODONE) immediate release tablet 5 mg (5 mg Oral Given 03/10/20 2028)    ED Course  I have reviewed the triage vital signs and the nursing notes.  Pertinent labs & imaging results that were available during my care of the patient were reviewed by me and considered in my medical decision making (see chart for details).    MDM Rules/Calculators/A&P                          74 year old female complaining of bilateral lower extremity pain, chronic wounds and new discoloration of the leg.  Reviewed/interpreted labs.  CBC is leukocytosis.  BMP does show elevated blood glucose.  Patient has not taken her insulin today as she has been in the emergency department for an extended period of time.  Not having any symptoms of DKA and she has a normal bicarb and no elevated anion gap.  Patient was given her home insulin and on recheck her blood sugar is improving.  ABIs were conducted and results are above.  8:08 PM CONSULT with Dr. [name] with vascular surgery who states that this is likely a chronic issue and he feels that if patient does not have another indication for admission then this problem  is likely able to be followed up as an outpatient.  He states the office will reach out to the patient to schedule an appointment for Wednesday/Thursday.   Reassessed patient.  Discussed plan to follow-up with vascular surgery.  Discussed plan to also have her be discharged on antibiotics for possible developing infection.  She is given strict return precautions for any new or worsening symptoms.  Patient voices understanding of the plan and reasons to return.  All questions answered.  Patient stable for discharge.    Final Clinical Impression(s) / ED Diagnoses Final diagnoses:  Pain in both lower extremities  Multiple open wounds of lower leg, unspecified laterality, initial encounter    Rx / DC Orders ED Discharge Orders         Ordered  cephALEXin (KEFLEX) 500 MG capsule  4 times daily        03/10/20 2059           Bishop Dublin 03/10/20 2104    Charlesetta Shanks, MD 03/22/20 1134

## 2020-03-11 ENCOUNTER — Telehealth: Payer: Self-pay | Admitting: Internal Medicine

## 2020-03-11 NOTE — Telephone Encounter (Signed)
New message:   [name] is calling from [facility] to let us know that the pt has an ABI at 9:00 with them tomorrow morning and she will see Dr. [name] there too. She states to please cancel the order for the ABI she has scheduled for on 03/12/20 at 3:00. Please advise.

## 2020-03-12 ENCOUNTER — Other Ambulatory Visit: Payer: Self-pay

## 2020-03-12 ENCOUNTER — Other Ambulatory Visit: Payer: Self-pay | Admitting: *Deleted

## 2020-03-12 ENCOUNTER — Ambulatory Visit (INDEPENDENT_AMBULATORY_CARE_PROVIDER_SITE_OTHER): Payer: Medicare Other | Admitting: Vascular Surgery

## 2020-03-12 ENCOUNTER — Ambulatory Visit (HOSPITAL_COMMUNITY): Payer: Medicare Other

## 2020-03-12 ENCOUNTER — Other Ambulatory Visit: Payer: Self-pay | Admitting: Internal Medicine

## 2020-03-12 ENCOUNTER — Encounter: Payer: Self-pay | Admitting: Vascular Surgery

## 2020-03-12 ENCOUNTER — Encounter (HOSPITAL_COMMUNITY): Payer: Medicare Other

## 2020-03-12 VITALS — BP 144/81 | HR 89 | Temp 97.9°F | Resp 18 | Ht 60.0 in | Wt 160.0 lb

## 2020-03-12 DIAGNOSIS — I739 Peripheral vascular disease, unspecified: Secondary | ICD-10-CM

## 2020-03-12 DIAGNOSIS — I2510 Atherosclerotic heart disease of native coronary artery without angina pectoris: Secondary | ICD-10-CM

## 2020-03-12 DIAGNOSIS — Z01812 Encounter for preprocedural laboratory examination: Secondary | ICD-10-CM

## 2020-03-12 DIAGNOSIS — E1140 Type 2 diabetes mellitus with diabetic neuropathy, unspecified: Secondary | ICD-10-CM

## 2020-03-12 MED ORDER — CEPHALEXIN 500 MG PO CAPS: 500.0000 mg | ORAL_CAPSULE | Freq: Two times a day (BID) | ORAL | 0 refills | Status: AC

## 2020-03-12 NOTE — Progress Notes (Signed)
Referring Physician: [place]  Patient name: [name] MRN: [id number] DOB: Dec 13, 1945 Sex: female  HPI: Shawnte Winton is a 74 y.o. female, with a 61-month history of nonhealing wounds in both legs. She does not really know how the ulcer started.  He has not really walked in the last 3 months secondary to pain and swelling in her legs.  She was seen at the wound center at [facility] on February 29, 2020.  It sounds like she was placed in a compressive dressing at that point.  She fairly quickly removed to these dressings because she stated that they were too tight.  She was seen in the emergency room complaining of pain and swelling in both legs on August 23.  She had some redness around the wound in her left leg and was started on Keflex.  However, the patient did not start the antibiotics because she states that when she went to the pharmacy they were not available.  Patient complains of pain in both ulcer sites.  Her pain management is currently being done by her primary care physician Dr. Alain Marion.  She denies prior history of DVT.  When she was seen in the emergency room she was noted to have a creatinine of 3.  She also had a glucose of 700.  I spoke with her nephrologist Dr. Joelyn Oms today.  He states that this is an elevation of her usual baseline creatinine but that she has a chronically failing kidney transplant.  Patient does not really describe claudication but is nonambulatory.  Other medical problems include history of lung cancer with possible new lung nodule under evaluation by oncology service and scheduled for a chest CT in the near future.,  Chronic atrial fibrillation on warfarin.  The patient is also on chronic home oxygen.  Past Medical History:  Diagnosis Date  . Acute CHF (congestive heart failure) ([place]) 12/2018  . Adenocarcinoma of lung, stage 1, right ([place]) 11/25/2017  . Anemia, iron deficiency    of chronic disease  . Aortic stenosis    a. Severe AS by echo 11/2012.  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 Asystole ([place])    a. During ENT surgery 2005: developed marked asystole requiring CPR, felt due to vagal reaction (cath nonobst dz).  . Carotid artery disease ([place])    a. Carotid Dopplers performed in August 2013 showed 40-59% left stenosis and 0-39% right; f/u recommended in 2 years.   . Cerebrovascular accident [name]) 2009   a. LMCA infarct felt embolic 7412, maintained on chronic coumadin.; denies residual on 04/05/2013  . Cholelithiasis   . Chronic Persistent Atrial Fibrillation 12/31/2008   Qualifier: Diagnosis of  By: [name]    . Coronary artery disease 05/2002   a. Ant MI 2003 s/p PTCA/stent to RCA.   . Diverticulosis of colon   . Esophagitis, reflux   . ESRD (end stage renal disease) ([place])    a. Mass on L kidney per pt s/p nephrectomy - pt states not cancer - [facility] notes indicate ESRD due to HTN/DM - was previously on HD. b. Kidney transplant 02/2011.  Marland Kitchen GERD (gastroesophageal reflux disease)   . Gout   . Hearing loss   . Helicobacter pylori (H. pylori) infection    hx of  . Hemorrhoids   . Hx of colonic polyps    adenomatous  . Hyperlipidemia   . Hypertension   . Lung nodule seen on imaging study 04/07/2013  1.0 cm ground glass opacity RUL  . Myocardial infarction ([place]) 2003  . Pericardial effusion    a. Small by echo 11/2011.  . S/P aortic valve replacement with bioprosthetic valve and maze procedure 04/12/2013   12mm [facility] Ease bovine pericardial tissue valve   . S/P Maze operation for atrial fibrillation 04/12/2013   Complete bilateral atrial lesion set using bipolar radiofrequency and cryothermy ablation with clipping of LA appendage  . Sleep apnea    Pt says testing was positive, intolerant of CPAP.  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   . Stroke ([place])   . Type II diabetes mellitus ([place])    Past Surgical History:  Procedure Laterality Date  . AORTIC VALVE REPLACEMENT N/A 04/12/2013   Procedure: AORTIC VALVE REPLACEMENT (AVR);  Surgeon: Rexene Alberts, MD;  Location: [place];  Service: Open Heart Surgery;  Laterality: N/A;  . ARTERIOVENOUS GRAFT PLACEMENT Left   . ARTERIOVENOUS GRAFT PLACEMENT Left    "I've had 2 on my left; had one removed" (04/05/2013)   . ARTERY EXPLORATION Right 04/11/2013   Procedure: ARTERY EXPLORATION;  Surgeon: Rexene Alberts, MD;  Location: [place];  Service: Open Heart Surgery;  Laterality: Right;  Right carotid artery exploration  . AV FISTULA PLACEMENT Right   . AV FISTULA REPAIR Right    "took it out" ((/18/2014)  . CARDIOVERSION  05/29/2012   Procedure: CARDIOVERSION;  Surgeon: Lelon Perla, MD;  Location: [facility] ENDOSCOPY;  Service: Cardiovascular;  Laterality: N/A;  . CHOLECYSTECTOMY  2009   with hernia removal  . CORONARY ANGIOPLASTY WITH STENT PLACEMENT Right    coronary artery  . INSERTION OF DIALYSIS CATHETER Bilateral    "over the years; took them both out" (04/05/2013)  . INTRAOPERATIVE TRANSESOPHAGEAL ECHOCARDIOGRAM N/A 04/11/2013   Procedure: INTRAOPERATIVE TRANSESOPHAGEAL ECHOCARDIOGRAM;  Surgeon: Rexene Alberts, MD;  Location: [place];  Service: Open Heart Surgery;  Laterality: N/A;  . INTRAOPERATIVE TRANSESOPHAGEAL ECHOCARDIOGRAM N/A 04/12/2013   Procedure: INTRAOPERATIVE TRANSESOPHAGEAL ECHOCARDIOGRAM;  Surgeon: Rexene Alberts, MD;  Location: [place];  Service: Open Heart Surgery;  Laterality: N/A;  . KIDNEY TRANSPLANT  03/16/11  . LEFT AND RIGHT HEART CATHETERIZATION WITH CORONARY ANGIOGRAM N/A 04/06/2013   Procedure: LEFT AND RIGHT HEART CATHETERIZATION WITH CORONARY ANGIOGRAM;  Surgeon: Blane Ohara, MD;  Location: [facility] CATH LAB;  Service: Cardiovascular;  Laterality: N/A;  . MAZE N/A 04/12/2013   Procedure: MAZE;  Surgeon: Rexene Alberts, MD;  Location: [place];  Service: Open  Heart Surgery;  Laterality: N/A;  . NASAL RECONSTRUCTION WITH SEPTAL REPAIR     "took it out" (04/05/2013)  . NEPHRECTOMY Left 2010   no CA on bx  . TONSILLECTOMY    . TOTAL ABDOMINAL HYSTERECTOMY    . TUBAL LIGATION      Family History  Problem Relation Age of Onset  . Stroke Father   . Hypertension Mother   . Diabetes Other   . Diabetes Maternal Grandmother   . Diabetes Son   . Crohn's disease Other        grandson   . Breast cancer Neg Hx   . Stomach cancer Neg Hx   . Esophageal cancer Neg Hx   . Colon cancer Neg Hx   . Pancreatic cancer Neg Hx     SOCIAL HISTORY: Social History   Socioeconomic History  .  Marital status: Married    Spouse name: Not on file  . Number of children: 5  . Years of education: 61  . Highest education level: Not on file  Occupational History  . Occupation: retired    Fish farm manager: RETIRED  Tobacco Use  . Smoking status: Former Smoker    Packs/day: 1.00    Years: 30.00    Pack years: 30.00    Types: Cigarettes    Quit date: 07/19/2001    Years since quitting: 18.6  . Smokeless tobacco: Never Used  Vaping Use  . Vaping Use: Never used  Substance and Sexual Activity  . Alcohol use: No  . Drug use: No  . Sexual activity: Not Currently  Other Topics Concern  . Not on file  Social History Narrative   Patient signed a Designated Party Release to allow her spouse [name] and family and five children to have access to her medical records/information.     Patient lives with husband in a 2 story home.  Has 5 children. Retired from Personal assistant.  Education: 3 years of college.    Social Determinants of Health   Financial Resource Strain:   . Difficulty of Paying Living Expenses: Not on file  Food Insecurity: No Food Insecurity  . Worried About Charity fundraiser in the Last Year: Never true  . Ran Out of Food in the Last Year: Never true  Transportation Needs: No Transportation Needs  . Lack of Transportation (Medical): No  . Lack of  Transportation (Non-Medical): No  Physical Activity: Inactive  . Days of Exercise per Week: 0 days  . Minutes of Exercise per Session: 0 min  Stress: Stress Concern Present  . Feeling of Stress : Rather much  Social Connections:   . Frequency of Communication with Friends and Family: Not on file  . Frequency of Social Gatherings with Friends and Family: Not on file  . Attends Religious Services: Not on file  . Active Member of Clubs or Organizations: Not on file  . Attends Archivist Meetings: Not on file  . Marital Status: Not on file  Intimate Partner Violence: Not At Risk  . Fear of Current or Ex-Partner: No  . Emotionally Abused: No  . Physically Abused: No  . Sexually Abused: No    Allergies  Allergen Reactions  . Ibuprofen Nausea And Vomiting  . Sulfamethoxazole-Trimethoprim Itching, Swelling and Rash    Swelling of the face  . Sulfonamide Derivatives Itching, Swelling and Rash    Swelling of the face  . Tape Rash    Paper tape is ok  . Tramadol Nausea And Vomiting  . Doxycycline Nausea Only  . Hydrocil [Psyllium] Nausea And Vomiting  . Bactrim Itching, Swelling and Rash  . Red Dye Itching and Rash    Current Outpatient Medications  Medication Sig Dispense Refill  . acetaminophen (TYLENOL) 500 MG tablet Take 500 mg by mouth every 6 (six) hours as needed for mild pain or headache.    . clobetasol ointment (TEMOVATE) 8.18 % Apply 1 application topically 2 (two) times daily. 60 g 1  . dapsone 25 MG tablet Take 25 mg by mouth daily.     Marland Kitchen gabapentin (NEURONTIN) 300 MG capsule Take 1 capsule (300 mg total) by mouth 3 (three) times daily. 90 capsule 5  . insulin glargine, 1 Unit Dial, (TOUJEO SOLOSTAR) 300 UNIT/ML Solostar Pen Inject 60 Units into the skin every morning. Titrate up by 1 unit a day if needed  for goal sugars of 100-130 up to 50 units a day max 3 pen 3  . insulin lispro (HUMALOG KWIKPEN) 200 UNIT/ML KwikPen Inject 10 Units into the skin with  breakfast, with lunch, and with evening meal. 9 mL 1  . Insulin Pen Needle (B-D UF III MINI PEN NEEDLES) 31G X 5 MM MISC USE TO ADMINISTER INSULIN FOUR TIMES A DAY DX E11.9 100 each 1  . levothyroxine (SYNTHROID) 25 MCG tablet TAKE 2 TABLETS (50 MCG TOTAL) BY MOUTH DAILY. 180 tablet 3  . lidocaine (XYLOCAINE) 5 % ointment Apply 1 application topically 4 (four) times daily as needed. 50 g 1  . metoprolol tartrate (LOPRESSOR) 25 MG tablet Take 0.5 tablets (12.5 mg total) by mouth 2 (two) times daily. 90 tablet 3  . ONETOUCH DELICA LANCETS 16W MISC Use to check blood sugars three times a day DX E11.9 100 each 5  . ONETOUCH VERIO test strip USE TO TEST 3 TIMES DAILY. DX E11.9 100 strip 5  . OXYGEN Inhale 3 L into the lungs as needed (for shortness of breath).     . predniSONE (DELTASONE) 5 MG tablet Take 1 tablet by mouth daily.    Marland Kitchen Propylene Glycol (SYSTANE BALANCE) 0.6 % SOLN Place 1-2 drops into both eyes 3 (three) times daily as needed (for dryness).    . repaglinide (PRANDIN) 0.5 MG tablet Take 0.5 mg by mouth 3 (three) times daily before meals.    . rosuvastatin (CRESTOR) 20 MG tablet Take 1 tablet (20 mg total) by mouth daily. (Patient taking differently: Take 20 mg by mouth every other day. ) 90 tablet 3  . tacrolimus (PROGRAF) 1 MG capsule Take 3 mg by mouth 2 (two) times daily.     Marland Kitchen torsemide (DEMADEX) 100 MG tablet Take 2 tablets (200 mg total) by mouth daily. 60 tablet 5  . triamcinolone cream (KENALOG) 0.1 % Apply 1 application topically 4 (four) times daily. 80 g 1  . warfarin (COUMADIN) 5 MG tablet TAKE 1/2 TO 1 TABLET BY MOUTH DAILY AS DIRECTED BY COUMADIN CLINIC 90 tablet 1  . amoxicillin (AMOXIL) 500 MG capsule TAKE 4 CAPSULES BY MOUTH 1 HOUR PRIOR TO DENTAL WORK (Patient not taking: Reported on 03/12/2020) 4 capsule 1  . cephALEXin (KEFLEX) 500 MG capsule Take 1 capsule (500 mg total) by mouth 4 (four) times daily for 7 days. (Patient not taking: Reported on 03/12/2020) 28 capsule 0    . hydrALAZINE (APRESOLINE) 25 MG tablet Take 25 mg by mouth 3 (three) times daily. (Patient not taking: Reported on 03/12/2020)    . oxyCODONE-acetaminophen (PERCOCET/ROXICET) 5-325 MG tablet Take 1 tablet by mouth every 8 (eight) hours as needed for severe pain. (Patient not taking: Reported on 03/12/2020) 60 tablet 0  . potassium chloride SA (KLOR-CON M20) 20 MEQ tablet Take 1 tablet (20 mEq total) by mouth daily. (Patient taking differently: Take 40 mEq by mouth daily. ) 30 tablet 3   No current facility-administered medications for this visit.    ROS:   General:  No weight loss, Fever, chills  HEENT: No recent headaches, no nasal bleeding, no visual changes, no sore throat  Neurologic: No dizziness, blackouts, seizures. No recent symptoms of stroke or mini- stroke. No recent episodes of slurred speech, or temporary blindness.  Cardiac: No recent episodes of chest pain/pressure, no shortness of breath at rest.  No shortness of breath with exertion.  + history of atrial fibrillation or irregular heartbeat  Vascular: No history of rest  pain in feet.  No history of claudication.  + history of non-healing ulcer, No history of DVT   Pulmonary: + home oxygen, no productive cough, no hemoptysis,  No asthma or wheezing  Musculoskeletal:  [ ]  Arthritis, [ ]  Low back pain,  [ ]  Joint pain  Hematologic:No history of hypercoagulable state.  No history of easy bleeding.  No history of anemia  Gastrointestinal: No hematochezia or melena,  No gastroesophageal reflux, no trouble swallowing  Urinary: [X]  chronic Kidney disease, [ ]  on HD - [ ]  MWF or [ ]  TTHS, [ ]  Burning with urination, [ ]  Frequent urination, [ ]  Difficulty urinating;   Skin: No rashes  Psychological: No history of anxiety,  No history of depression   Physical Examination  Vitals:   03/12/20 0902  BP: (!) 144/81  Pulse: 89  Resp: 18  Temp: 97.9 F (36.6 C)  TempSrc: Temporal  SpO2: 94%  Weight: 160 lb (72.6 kg)   Height: 5' (1.524 m)    Body mass index is 31.25 kg/m.  General:  Alert and oriented, no acute distress HEENT: Normal Neck: No JVD Cardiac: Regular Rate and Rhythm Skin: No rash, 2 cm ulceration right posterior calf with fibrinous exudate 2 mm depth, left pretibial ulcer similar diameter also with the burn is exudate Extremity Pulses: Absent popliteal dorsalis pedis, posterior tibial pulses bilaterally, unable to examine patient's femoral pulses as she is in a wheelchair Musculoskeletal: No deformity 1+ bilateral lower extremity edema  Neurologic: Upper and lower extremity motor 5/5 and symmetric  DATA:  Patient recently had ABIs performed at [facility] which were noncompressible vessels bilaterally and a toe pressure of 0 bilaterally.  She has previously had abnormal ABIs several years ago.  She also previously had an aortoiliac duplex scan performed by Dr. Fletcher Anon several years ago which showed no significant aortoiliac occlusive disease and suggested she had bilateral superficial femoral artery occlusions.  ASSESSMENT: Difficult situation a complex patient.  She certainly has a component of peripheral arterial disease which is limiting her wound healing ability.  However, any intervention to improve her overall arterial circulation would involve probably at least some iodinated contrast which may put her at risk of permanent hemodialysis.  I discussed all this with the patient and her grandson today.  I also discussed this by phone with Dr. Joelyn Oms.  Patient understands that this may be a difficult decision making process.  I offered her today the possibility of an arteriogram if her creatinine improves to 2 or less which was the recommendation of Dr. Joelyn Oms.  I also discussed with her that if local wound care did not heal these wounds we would be forced with the decision of potentially an amputation versus considering an arteriogram that may put her in permanent renal failure.  She  overall has several medical comorbidities which make her a poor open operative candidate but she may be a candidate for percutaneous revascularization.  Also discussed with her that some fine-tuning of her glucose management would be necessary for assistance with wound healing.  Her recent serum glucose in the emergency room was 700.  All of this was discussed with her today.   PLAN: 1.  Patient was given a additional prescription for Keflex since she was unable to fill the 1 recommended by the emergency room 2 days ago.  This was for 500 mg twice a day for 10 days.  2.  She will continue to follow-up with the wound center for local  wound care.  I also discussed with her that compression is going to be a large component of healing these wounds as the edema in her legs is also decreasing wound healing.  3.  Discussed with the patient she needs to have better glucose control for assistance in wound healing.  She is going to try to make better efforts to control her glucose and if she is still having difficulties she will discuss with her primary care physician.  4.  The patient will follow up with me in 2 to 3 weeks and we will obtain a BMET prior to that office visit to make a further determination of her risk of contrast nephropathy from arteriogram.  Ruta Hinds, MD [facility]: [phone]

## 2020-03-12 NOTE — Telephone Encounter (Signed)
oxyCODONE-acetaminophen (PERCOCET/ROXICET) 5-325 MG tablet  [id number] [address], [place] [address] Phone:  [phone]  Fax:  [phone]     Last appt: 8.11.21 Next appt: not scheduled  Patient's husband calling to get this medication refilled...would like asap...wife is in pain

## 2020-03-13 NOTE — Telephone Encounter (Signed)
Follow up   Spouse calling to request status of refill, patient has no medication remaining Scheduler advised to allow more time

## 2020-03-14 ENCOUNTER — Encounter (HOSPITAL_BASED_OUTPATIENT_CLINIC_OR_DEPARTMENT_OTHER): Payer: Medicare Other | Admitting: Internal Medicine

## 2020-03-14 DIAGNOSIS — I5032 Chronic diastolic (congestive) heart failure: Secondary | ICD-10-CM | POA: Diagnosis not present

## 2020-03-14 DIAGNOSIS — E1122 Type 2 diabetes mellitus with diabetic chronic kidney disease: Secondary | ICD-10-CM | POA: Diagnosis not present

## 2020-03-14 DIAGNOSIS — L97211 Non-pressure chronic ulcer of right calf limited to breakdown of skin: Secondary | ICD-10-CM | POA: Diagnosis not present

## 2020-03-14 DIAGNOSIS — L97822 Non-pressure chronic ulcer of other part of left lower leg with fat layer exposed: Secondary | ICD-10-CM | POA: Diagnosis not present

## 2020-03-14 DIAGNOSIS — L97221 Non-pressure chronic ulcer of left calf limited to breakdown of skin: Secondary | ICD-10-CM | POA: Diagnosis not present

## 2020-03-14 DIAGNOSIS — L97212 Non-pressure chronic ulcer of right calf with fat layer exposed: Secondary | ICD-10-CM | POA: Diagnosis not present

## 2020-03-14 DIAGNOSIS — I130 Hypertensive heart and chronic kidney disease with heart failure and stage 1 through stage 4 chronic kidney disease, or unspecified chronic kidney disease: Secondary | ICD-10-CM | POA: Diagnosis not present

## 2020-03-14 DIAGNOSIS — E11621 Type 2 diabetes mellitus with foot ulcer: Secondary | ICD-10-CM | POA: Diagnosis not present

## 2020-03-14 NOTE — Telephone Encounter (Signed)
[place] Controlled Database Checked Last filled: 02/27/2020 (60) LOV w/you: 02/27/20 Next appt w/you: n/a

## 2020-03-15 NOTE — Progress Notes (Signed)
SYNCERE, KAMINSKI ([id number]) Visit Report for 02/29/2020 Chief Complaint Document Details Patient Name: Date of Service: [name] RIS 02/29/2020 2:45 PM Medical Record Number: [id number] Patient Account Number: [id number] Date of Birth/Sex: Treating RN: 31-Jul-1945 (74 y.o. [name] Primary Care Provider: Cassandria [name] Other Clinician: Referring Provider: Treating Provider/Extender: [name] in Treatment: 0 Information Obtained from: Patient Chief Complaint Leg ulcers x 6 weeks Electronic Signature(s) Signed: 02/29/2020 4:04:43 PM By: [name] Bastos MD, MBA Entered By: [name] on 02/29/2020 16:04:43 -------------------------------------------------------------------------------- HPI Details Patient Name: Date of Service: [name] RIS 02/29/2020 2:45 PM Medical Record Number: [id number] Patient Account Number: [id number] Date of Birth/Sex: Treating RN: March 05, 1946 (74 y.o. [name] Primary Care Provider: Cassandria [name] Other Clinician: Referring Provider: Treating Provider/Extender: [name] in Treatment: 0 History of Present Illness HPI Description: 74 year old female with bilateral leg wounds one on the left anterior leg and 1 right posterior leg for about 6 weeks, lately she has noted excessive drainage that soaks towels at night by the time she wakes up in the morning, especially from the left shin wound. Patient stated this started out as pinholes and became bigger. She had a couple 1 on the left and one on the right more distal near the feet close up on their own. She has been using Neosporin and clobetasol cream to the area and using gauze dressing. ABIs today right unobtainable, left 0.8, Interestingly her ABIs in 2018 were 0.47 on the right and 0.71 on the left, Performed by Dr. Earleen [name] Patient's history is complex with post renal transplant and kidney disease stage  IV, history of lung cancer status post XRT insulin requiring diabetes, chronic A. , fib status post Maze procedure, diastolic heart failure, A. fib on Coumadin, aortic stenosis status post AVR, OSA without CPAP use. Patient denies any fevers chills shakes, has significant degree of pain in the shin area and the wound and has difficulty having any pressure on it Electronic Signature(s) Signed: 02/29/2020 4:08:01 PM By: [name] Bastos MD, MBA Entered By: [name] on 02/29/2020 16:08:00 -------------------------------------------------------------------------------- Physical Exam Details Patient Name: Date of Service: [name] RIS 02/29/2020 2:45 PM Medical Record Number: [id number] Patient Account Number: [id number] Date of Birth/Sex: Treating RN: [birth year] (74 y.o. [name] Primary Care Provider: Cassandria [name] Other Clinician: Referring Provider: Treating Provider/Extender: [name] in Treatment: 0 Constitutional alert and oriented x 3. sitting or standing blood pressure is within target range for patient.. supine blood pressure is within target range for patient.. pulse regular and within target range for patient.Marland Kitchen respirations regular, non-labored and within target range for patient.Marland Kitchen temperature within target range for patient.. . . Well- nourished and well-hydrated in no acute distress. Eyes conjunctiva clear no eyelid edema noted. pupils equal round and reactive to light and accommodation. Ears, Nose, Mouth, and Throat no gross abnormality of ear auricles or external auditory canals. normal hearing noted during conversation. mucus membranes moist. Neck supple with no LAD noted in anterior or posterior cervical chain. not enlarged. Respiratory normal breathing without difficulty. clear to auscultation bilaterally. Cardiovascular Irregular rhythm, rate in the 90s, S1-S2 heard, soft systolic murmur. no bruits with no  significant JVD. 2+ dorsalis pedis/posterior tibialis pulses. No clubbing, or cyanosis, significant edema in both legs left more than right, less than 3-second cap refill on the right. Gastrointestinal (GI) soft, non-tender, non-distended, +BS. no hepatosplenomegaly. no ventral  hernia noted. Musculoskeletal normal gait and posture. no significant deformity or arthritic changes, no loss or range of motion, no clubbing. full range of motion without deformity. full range of motion without deformity. full range of motion with greater than 10 degrees of flexion of the ankle. full range of motion with greater than 10 degrees of flexion of the ankle. Integumentary (Hair, Skin) normal hair distribution and pattern. skin pink, warm, dry. Neurological cranial nerves 2-12 intact. Patient has normal sensation in the feet bilaterally to light touch. Psychiatric this patient is able to make decisions and demonstrates good insight into disease process. Alert and Oriented x 3. pleasant and cooperative. Notes Left shin ulcer with drainage of clear fluid almost pulsatile Right posterior calf ulcer which is tiny ul Skin on both legs is shiny and smooth with no evidence of cellulitis or infectioncer with some drainage but much less so than the left Electronic Signature(s) Signed: 02/29/2020 4:09:49 PM By: [name] Bastos MD, MBA Entered By: [name] on 02/29/2020 16:09:47 -------------------------------------------------------------------------------- Physician Orders Details Patient Name: Date of Service: [name] RIS 02/29/2020 2:45 PM Medical Record Number: [id number] Patient Account Number: [id number] Date of Birth/Sex: Treating RN: 12/15/1945 (74 y.o. [name] Primary Care Provider: Cassandria [name] Other Clinician: Referring Provider: Treating Provider/Extender: [name] in Treatment: 0 Verbal / Phone Orders: No Diagnosis Coding Follow-up  Appointments ppointment in 2 weeks. - MD visit, Friday Return A Other: - HH to change Once a week on weeks that patient comes to wound care center and Twice a when not being seen at wound care center. Dressing Change Frequency [name] not change entire dressing for one week. Wound Cleansing May shower with protection. - cast protector Primary Wound Dressing Wound #1 Right,Posterior Lower Leg Calcium Alginate with Silver Wound #2 Left,Anterior Lower Leg Calcium Alginate with Silver Secondary Dressing Wound #1 Right,Posterior Lower Leg ABD pad Zetuvit or Kerramax Drawtex Wound #2 Left,Anterior Lower Leg ABD pad Zetuvit or Kerramax Drawtex Edema Control Kerlix and Coban - Bilateral - LIGHTLY Avoid standing for long periods of time Elevate legs to the level of the heart or above for 30 minutes daily and/or when sitting, a frequency of: [place] skilled nursing for wound care. - [facility]. T change dressing Twice next week. o Electronic Signature(s) Signed: 02/29/2020 4:53:28 PM By: Kela Millin Signed: 02/29/2020 4:53:36 PM By: [name] Bastos MD, MBA Entered By: Kela Millin on 02/29/2020 16:09:34 -------------------------------------------------------------------------------- Problem List Details Patient Name: Date of Service: [name], [name] RIS 02/29/2020 2:45 PM Medical Record Number: [id number] Patient Account Number: [id number] Date of Birth/Sex: Treating RN: 06/29/1946 (74 y.o. [name] Primary Care Provider: Cassandria [name] Other Clinician: Referring Provider: Treating Provider/Extender: [name] in Treatment: 0 Active Problems ICD-10 Encounter Code Description Active Date MDM Diagnosis L97.211 Non-pressure chronic ulcer of right calf limited to breakdown of skin 02/29/2020 No Yes L97.221 Non-pressure chronic ulcer of left calf limited to breakdown of skin 02/29/2020 No Yes X10.62 Chronic  diastolic (congestive) heart failure 02/29/2020 No Yes N18.4 Chronic kidney disease, stage 4 (severe) 02/29/2020 No Yes E11.22 Type 2 diabetes mellitus with diabetic chronic kidney disease 02/29/2020 No Yes Inactive Problems Resolved Problems Electronic Signature(s) Signed: 02/29/2020 4:04:24 PM By: [name] Bastos MD, MBA Entered By: [name] on 02/29/2020 16:04:23 -------------------------------------------------------------------------------- Progress Note Details Patient Name: Date of Service: [name] RIS 02/29/2020 2:45 PM Medical Record Number: [id number] Patient Account Number:  [id number] Date of Birth/Sex: Treating RN: 07-Jul-1946 (74 y.o. [name] Primary Care Provider: Cassandria [name] Other Clinician: Referring Provider: Treating Provider/Extender: [name] in Treatment: 0 Subjective Chief Complaint Information obtained from Patient Leg ulcers x 6 weeks History of Present Illness (HPI) 74 year old female with bilateral leg wounds one on the left anterior leg and 1 right posterior leg for about 6 weeks, lately she has noted excessive drainage that soaks towels at night by the time she wakes up in the morning, especially from the left shin wound. Patient stated this started out as pinholes and became bigger. She had a couple 1 on the left and one on the right more distal near the feet close up on their own. She has been using Neosporin and clobetasol cream to the area and using gauze dressing. ABIs today right unobtainable, left 0.8, Interestingly her ABIs in 2018 were 0.47 on the right and 0.71 on the left, Performed by Dr. Earleen [name] Patient's history is complex with post renal transplant and kidney disease stage IV, history of lung cancer status post XRT insulin requiring diabetes, chronic A. , fib status post Maze procedure, diastolic heart failure, A. fib on Coumadin, aortic stenosis status post AVR, OSA without CPAP  use. Patient denies any fevers chills shakes, has significant degree of pain in the shin area and the wound and has difficulty having any pressure on it Patient History Information obtained from Patient. Allergies ibuprofen, Sulfa (Sulfonamide Antibiotics), tramadol, doxycycline, Hydrocil, Bactrim, red dye Family History Diabetes - Maternal Grandparents, Heart Disease - Maternal Grandparents,Paternal Grandparents, Hypertension - Maternal Grandparents,Paternal Grandparents, Kidney Disease - Maternal Grandparents,Paternal Grandparents,Mother,Siblings, Lung Disease - Siblings,Child, Stroke - Child, No family history of Cancer, Hereditary Spherocytosis, Seizures, Thyroid Problems, Tuberculosis. Social History Former smoker, Marital Status - Married, Alcohol Use - Never, Drug Use - No History, Caffeine Use - Rarely. Medical History Eyes Denies history of Cataracts, Glaucoma, Optic Neuritis Ear/Nose/Mouth/Throat Denies history of Chronic sinus problems/congestion, Middle ear problems Hematologic/Lymphatic Denies history of Anemia, Hemophilia, Human Immunodeficiency Virus, Lymphedema, Sickle Cell Disease Respiratory Patient has history of Chronic Obstructive Pulmonary Disease (COPD) Denies history of Aspiration, Asthma, Pneumothorax, Sleep Apnea, Tuberculosis Cardiovascular Patient has history of Hypertension, Myocardial Infarction Denies history of Angina, Arrhythmia, Congestive Heart Failure, Coronary Artery Disease, Deep Vein Thrombosis, Hypotension, Peripheral Arterial Disease, Peripheral Venous Disease, Phlebitis, Vasculitis Gastrointestinal Denies history of Cirrhosis , Colitis, Crohnoos, Hepatitis A, Hepatitis B, Hepatitis C Endocrine Patient has history of Type II Diabetes Denies history of Type I Diabetes Genitourinary Denies history of End Stage Renal Disease Immunological Denies history of Lupus Erythematosus, Raynaudoos, Scleroderma Integumentary (Skin) Denies history of  History of Burn Musculoskeletal Denies history of Gout, Rheumatoid Arthritis, Osteoarthritis, Osteomyelitis Neurologic Denies history of Dementia, Neuropathy, Quadriplegia, Paraplegia, Seizure Disorder Oncologic Denies history of Received Chemotherapy, Received Radiation Psychiatric Denies history of Anorexia/bulimia, Confinement Anxiety Patient is treated with Insulin. Blood sugar is not tested. Review of Systems (ROS) Constitutional Symptoms (General Health) Denies complaints or symptoms of Fatigue, Fever, Chills, Marked Weight Change. Eyes Denies complaints or symptoms of Dry Eyes, Vision Changes, Glasses / Contacts. Ear/Nose/Mouth/Throat Denies complaints or symptoms of Chronic sinus problems or rhinitis. Respiratory Denies complaints or symptoms of Chronic or frequent coughs, Shortness of Breath. Cardiovascular Denies complaints or symptoms of Chest pain. Gastrointestinal Denies complaints or symptoms of Frequent diarrhea, Nausea, Vomiting. Endocrine Denies complaints or symptoms of Heat/cold intolerance. Genitourinary Denies complaints or symptoms of Frequent urination. Integumentary (Skin) Complains or has symptoms of  Wounds. Musculoskeletal Denies complaints or symptoms of Muscle Pain, Muscle Weakness. Neurologic Denies complaints or symptoms of Numbness/parasthesias. Psychiatric Denies complaints or symptoms of Claustrophobia, Suicidal. Objective Constitutional alert and oriented x 3. sitting or standing blood pressure is within target range for patient.. supine blood pressure is within target range for patient.. pulse regular and within target range for patient.Marland Kitchen respirations regular, non-labored and within target range for patient.Marland Kitchen temperature within target range for patient.. Well- nourished and well-hydrated in no acute distress. Vitals Time Taken: 3:02 PM, Height: 60 in, Source: Stated, Weight: 160 lbs, Source: Stated, BMI: 31.2, Temperature: 98.2 F, Pulse: 84  bpm, Respiratory Rate: 18 breaths/min, Blood Pressure: 156/76 mmHg, Capillary Blood Glucose: 220 mg/dl. Eyes conjunctiva clear no eyelid edema noted. pupils equal round and reactive to light and accommodation. Ears, Nose, Mouth, and Throat no gross abnormality of ear auricles or external auditory canals. normal hearing noted during conversation. mucus membranes moist. Neck supple with no LAD noted in anterior or posterior cervical chain. not enlarged. Respiratory normal breathing without difficulty. clear to auscultation bilaterally. Cardiovascular Irregular rhythm, rate in the 90s, S1-S2 heard, soft systolic murmur. no bruits with no significant JVD. 2+ dorsalis pedis/posterior tibialis pulses. No clubbing, or cyanosis, significant edema in both legs left more than right, less than 3-second cap refill on the right. Gastrointestinal (GI) soft, non-tender, non-distended, +BS. no hepatosplenomegaly. no ventral hernia noted. Musculoskeletal normal gait and posture. no significant deformity or arthritic changes, no loss or range of motion, no clubbing. full range of motion without deformity. full range of motion without deformity. full range of motion with greater than 10 degrees of flexion of the ankle. full range of motion with greater than 10 degrees of flexion of the ankle. Neurological cranial nerves 2-12 intact. Patient has normal sensation in the feet bilaterally to light touch. Psychiatric this patient is able to make decisions and demonstrates good insight into disease process. Alert and Oriented x 3. pleasant and cooperative. General Notes: Left shin ulcer with drainage of clear fluid almost pulsatile Right posterior calf ulcer which is tiny ul Skin on both legs is shiny and smooth with no evidence of cellulitis or infectioncer with some drainage but much less so than the left Integumentary (Hair, Skin) normal hair distribution and pattern. skin pink, warm, dry. Wound #1 status is  Open. Original cause of wound was Gradually Appeared. The wound is located on the Right,Posterior Lower Leg. The wound measures 0.5cm length x 0.5cm width x 0.2cm depth; 0.196cm^2 area and 0.039cm^3 volume. There is no tunneling or undermining noted. There is a large amount of serosanguineous drainage noted. There is no granulation within the wound bed. There is a large (67-100%) amount of necrotic tissue within the wound bed including Adherent Slough. Wound #2 status is Open. Original cause of wound was Gradually Appeared. The wound is located on the Left,Anterior Lower Leg. The wound measures 1cm length x 1.1cm width x 0.1cm depth; 0.864cm^2 area and 0.086cm^3 volume. There is Fat Layer (Subcutaneous Tissue) Exposed exposed. There is no tunneling or undermining noted. There is a large amount of serosanguineous drainage noted. There is small (1-33%) pink granulation within the wound bed. There is a large (67-100%) amount of necrotic tissue within the wound bed including Adherent Slough. 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 - MD visit, Friday Other: - HH to change Once a week on weeks that patient comes to wound care center and Twice a when not being seen at wound care center. Dressing Change Frequency: [name] not change entire dressing for one week. Wound Cleansing: May shower with protection. - cast protector Primary Wound Dressing: Wound #1 Right,Posterior Lower Leg: Calcium Alginate with Silver Wound #2 Left,Anterior Lower Leg: Calcium Alginate with Silver Secondary Dressing: Wound #1 Right,Posterior Lower Leg: ABD pad Zetuvit or  Kerramax Drawtex Wound #2 Left,Anterior Lower Leg: ABD pad Zetuvit or Kerramax Drawtex Edema Control: Kerlix and Coban - Bilateral - LIGHTLY Avoid standing for long periods of time Elevate legs to the level of the heart or above for 30 minutes daily and/or when sitting, a frequency of: Home Health: [place] skilled nursing for wound care. - [facility]. T change dressing Twice next week. o -We will initiate treatment in the clinic with silver alginate, drawtex, light compression on the left and right -Patient has appointment on August 19 for formal Doppler studies already scheduled -Patient asked to keep legs elevated as much as possible -Patient is on Demadex 200 mg daily initiated at this dose by nephrology about a month ago probably should have renal input regarding edema management which is undoubtedly playing a role in continuation of these wounds with drainage -Return to clinic In 2 weeks, she does have home health Electronic Signature(s) Signed: 02/29/2020 4:11:47 PM By: [name] Bastos MD, MBA Entered By: [name] on 02/29/2020 16:11:47 -------------------------------------------------------------------------------- HxROS Details Patient Name: Date of Service: [name] RIS 02/29/2020 2:45 PM Medical Record Number: [id number] Patient Account Number: [id number] Date of Birth/Sex: Treating RN: December 27, 1945 (74 y.o. [name] Primary Care Provider: Cassandria [name] Other Clinician: Referring Provider: Treating Provider/Extender: [name] in Treatment: 0 Information Obtained From Patient Constitutional Symptoms (General Health) Complaints and Symptoms: Negative for: Fatigue; Fever; Chills; Marked Weight Change Eyes Complaints and Symptoms: Negative for: Dry Eyes; Vision Changes; Glasses / Contacts Medical History: Negative for: Cataracts; Glaucoma; Optic Neuritis Ear/Nose/Mouth/Throat Complaints and  Symptoms: Negative for: Chronic sinus problems or rhinitis Medical History: Negative for: Chronic sinus problems/congestion; Middle ear problems Respiratory Complaints and Symptoms: Negative for: Chronic or frequent coughs; Shortness of Breath Medical History: Positive for: Chronic Obstructive Pulmonary Disease (COPD) Negative for: Aspiration; Asthma; Pneumothorax; Sleep Apnea; Tuberculosis Cardiovascular Complaints and Symptoms: Negative for: Chest pain Medical History: Positive for: Hypertension; Myocardial Infarction Negative for: Angina; Arrhythmia; Congestive Heart Failure; Coronary Artery Disease; Deep Vein Thrombosis; Hypotension; Peripheral Arterial Disease; Peripheral Venous Disease; Phlebitis; Vasculitis Gastrointestinal Complaints and Symptoms: Negative for: Frequent diarrhea; Nausea; Vomiting Medical History: Negative for: Cirrhosis ; Colitis; Crohns; Hepatitis A; Hepatitis B; Hepatitis C Endocrine Complaints and Symptoms: Negative for: Heat/cold intolerance Medical History: Positive for: Type II Diabetes Negative for: Type I Diabetes Time with diabetes: 10 Treated with: Insulin Blood sugar tested every day: No Genitourinary Complaints and Symptoms: Negative for: Frequent urination Medical History: Negative for: End Stage Renal Disease Integumentary (Skin) Complaints and Symptoms: Positive for: Wounds Medical History: Negative for: History of Burn Musculoskeletal Complaints and Symptoms: Negative for: Muscle Pain; Muscle Weakness Medical History: Negative for: Gout; Rheumatoid Arthritis; Osteoarthritis; Osteomyelitis Neurologic Complaints and Symptoms: Negative for: Numbness/parasthesias Medical History: Negative for: Dementia; Neuropathy; Quadriplegia; Paraplegia; Seizure Disorder Psychiatric Complaints and Symptoms: Negative for: Claustrophobia; Suicidal Medical History: Negative for: Anorexia/bulimia; Confinement  Anxiety Hematologic/Lymphatic Medical  History: Negative for: Anemia; Hemophilia; Human Immunodeficiency Virus; Lymphedema; Sickle Cell Disease Immunological Medical History: Negative for: Lupus Erythematosus; Raynauds; Scleroderma Oncologic Medical History: Negative for: Received Chemotherapy; Received Radiation Immunizations Pneumococcal Vaccine: Received Pneumococcal Vaccination: No Implantable Devices None Family and Social History Cancer: No; Diabetes: Yes - Maternal Grandparents; Heart Disease: Yes - Maternal Grandparents,Paternal Grandparents; Hereditary Spherocytosis: No; Hypertension: Yes - Maternal Grandparents,Paternal Grandparents; Kidney Disease: Yes - Maternal Grandparents,Paternal Grandparents,Mother,Siblings; Lung Disease: Yes - Siblings,Child; Seizures: No; Stroke: Yes - Child; Thyroid Problems: No; Tuberculosis: No; Former smoker; Marital Status - Married; Alcohol Use: Never; Drug Use: No History; Caffeine Use: Rarely; Financial Concerns: No; Food, Clothing or Shelter Needs: No; Support System Lacking: No; Transportation Concerns: No Engineer, maintenance) Signed: 02/29/2020 4:53:36 PM By: [name] Bastos MD, MBA Signed: 03/14/2020 5:50:16 PM By: [name] RN Entered By: [name] on 02/29/2020 15:16:45 -------------------------------------------------------------------------------- SuperBill Details Patient Name: Date of Service: [name], [name] RIS 02/29/2020 Medical Record Number: [id number] Patient Account Number: [id number] Date of Birth/Sex: Treating RN: July 08, 1946 (74 y.o. [name] Primary Care Provider: Cassandria [name] Other Clinician: Referring Provider: Treating Provider/Extender: [name] in Treatment: 0 Diagnosis Coding ICD-10 Codes Code Description [phone] Non-pressure chronic ulcer of right calf limited to breakdown of skin L97.221 Non-pressure chronic ulcer of left calf limited to breakdown  of skin E83.15 Chronic diastolic (congestive) heart failure N18.4 Chronic kidney disease, stage 4 (severe) E11.22 Type 2 diabetes mellitus with diabetic chronic kidney disease Facility Procedures CPT4 Code: [id number] Description: 71062 - WOUND CARE VISIT-LEV 5 EST PT Modifier: Quantity: 1 Physician Procedures : CPT4 Code Description Modifier [id number] - WC PHYS LEVEL 4 - NEW PT ICD-10 Diagnosis Description L97.221 Non-pressure chronic ulcer of left calf limited to breakdown of skin Quantity: 1 Electronic Signature(s) Signed: 02/29/2020 4:53:28 PM By: Kela Millin Signed: 02/29/2020 4:53:36 PM By: [name] Bastos MD, MBA Previous Signature: 02/29/2020 4:12:04 PM Version By: [name] Bastos MD, MBA Entered By: Kela Millin on 02/29/2020 16:12:44

## 2020-03-15 NOTE — Progress Notes (Signed)
[name], [name] ([id number]) Visit Report for 02/29/2020 Abuse/Suicide Risk Screen Details Patient Name: Date of Service: [name] RIS 02/29/2020 2:45 PM Medical Record Number: [id number] Patient Account Number: [id number] Date of Birth/Sex: Treating RN: 03-Apr-1946 (74 y.o. [name] Primary Care Markella Dao: [name] Other Clinician: Referring Arriel Victor: Treating Killian Ress/Extender: Raynelle Chary in Treatment: 0 Abuse/Suicide Risk Screen Items Answer ABUSE RISK SCREEN: Has anyone close to you tried to hurt or harm you recentlyo No Do you feel uncomfortable with anyone in your familyo No Has anyone forced you do things that you didnt want to doo No Electronic Signature(s) Signed: 03/14/2020 5:50:16 PM By: [name] RN Entered By: [name] on 02/29/2020 15:16:58 -------------------------------------------------------------------------------- Activities of Daily Living Details Patient Name: Date of Service: [name] RIS 02/29/2020 2:45 PM Medical Record Number: [id number] Patient Account Number: [id number] Date of Birth/Sex: Treating RN: November 04, 1945 (74 y.o. [name] Primary Care Ahnna Dungan: [name] Other Clinician: Referring Jaelin Devincentis: Treating Docie Abramovich/Extender: Raynelle Chary in Treatment: 0 Activities of Daily Living Items Answer Activities of Daily Living (Please select one for each item) Drive Automobile Completely Able T Medications ake Completely Able Use T elephone Completely Able Care for Appearance Completely Able Use T oilet Completely Able Bath / Shower Completely Able Dress Self Completely Able Feed Self Completely Able Walk Completely Able Get In / Out Bed Completely Able Housework Completely Able Prepare Meals Completely [place] for Self Completely Able Electronic Signature(s) Signed: 03/14/2020 5:50:16 PM By: [name]  RN Entered By: [name] on 02/29/2020 15:17:22 -------------------------------------------------------------------------------- Education Screening Details Patient Name: Date of Service: [name] Mode, DO RIS 02/29/2020 2:45 PM Medical Record Number: [id number] Patient Account Number: [id number] Date of Birth/Sex: Treating RN: July 29, 1945 (73 y.o. [name] Primary Care Arch Methot: [name] Other Clinician: Referring Hunt Zajicek: Treating Krue Peterka/Extender: Raynelle Chary in Treatment: 0 Primary Learner Assessed: Patient Learning Preferences/Education Level/Primary Language Learning Preference: Explanation Highest Education Level: College or Above Preferred Language: English Cognitive Barrier Language Barrier: No Translator Needed: No Memory Deficit: No Emotional Barrier: No Cultural/Religious Beliefs Affecting Medical Care: No Physical Barrier Impaired Vision: Yes Glasses Impaired Hearing: No Decreased Hand dexterity: No Knowledge/Comprehension Knowledge Level: Medium Comprehension Level: High Ability to understand written instructions: High Ability to understand verbal instructions: High Motivation Anxiety Level: Anxious Cooperation: Cooperative Education Importance: Acknowledges Need Interest in Health Problems: Asks Questions Perception: Coherent Willingness to Engage in Self-Management High Activities: Readiness to Engage in Self-Management High Activities: Electronic Signature(s) Signed: 03/14/2020 5:50:16 PM By: [name] RN Entered By: [name] on 02/29/2020 15:18:25 -------------------------------------------------------------------------------- Fall Risk Assessment Details Patient Name: Date of Service: [name] [name], DO RIS 02/29/2020 2:45 PM Medical Record Number: [id number] Patient Account Number: [id number] Date of Birth/Sex: Treating RN: [birth year] (74 y.o. [name] Primary Care Layton Naves: [name] Other Clinician: Referring Corvette Orser: Treating Sharran Caratachea/Extender: Raynelle Chary in Treatment: 0 Fall Risk Assessment Items Have you had 2 or more falls in the last 12 monthso 0 No Have you had any fall that resulted in injury in the last 12 monthso 0 No FALLS RISK SCREEN History of falling - immediate or within 3 months 0 No Secondary diagnosis (Do you have 2 or more medical diagnoseso) 0 No Ambulatory aid None/bed rest/wheelchair/nurse 0 No Crutches/cane/walker 0 No Furniture 0 No Intravenous therapy Access/Saline/Heparin Lock 0 No Gait/Transferring Normal/ bed rest/  wheelchair 0 No Weak (short steps with or without shuffle, stooped but able to lift head while walking, may seek 0 No support from furniture) Impaired (short steps with shuffle, may have difficulty arising from chair, head down, impaired 0 No balance) Mental Status Oriented to own ability 0 No Electronic Signature(s) Signed: 03/14/2020 5:50:16 PM By: [name] RN Entered By: [name] on 02/29/2020 15:18:34 -------------------------------------------------------------------------------- Foot Assessment Details Patient Name: Date of Service: [name] Mode, DO RIS 02/29/2020 2:45 PM Medical Record Number: [id number] Patient Account Number: [id number] Date of Birth/Sex: Treating RN: 09/03/1945 (74 y.o. [name] Primary Care Laiken Nohr: [name] Other Clinician: Referring Dodi Leu: Treating Delisia Mcquiston/Extender: Raynelle Chary in Treatment: 0 Foot Assessment Items Site Locations + = Sensation present, - = Sensation absent, C = Callus, U = Ulcer R = Redness, W = Warmth, M = Maceration, PU = Pre-ulcerative lesion F = Fissure, S = Swelling, D = Dryness Assessment Right: Left: Other Deformity: No No Prior Foot Ulcer: No No Prior Amputation: No No Charcot Joint: No No Ambulatory Status: Ambulatory Without Help Gait:  Steady Electronic Signature(s) Signed: 03/14/2020 5:50:16 PM By: [name] RN Entered By: [name] on 02/29/2020 15:21:15 -------------------------------------------------------------------------------- Nutrition Risk Screening Details Patient Name: Date of Service: [name] RIS 02/29/2020 2:45 PM Medical Record Number: [id number] Patient Account Number: [id number] Date of Birth/Sex: Treating RN: 27-Apr-1946 (74 y.o. [name] Primary Care Cynthia Cogle: [name] Other Clinician: Referring Jayin Derousse: Treating Anab Vivar/Extender: Raynelle Chary in Treatment: 0 Height (in): 60 Weight (lbs): 160 Body Mass Index (BMI): 31.2 Nutrition Risk Screening Items Score Screening NUTRITION RISK SCREEN: I have an illness or condition that made me change the kind and/or amount of food I eat 0 No I eat fewer than two meals per day 0 No I eat few fruits and vegetables, or milk products 0 No I have three or more drinks of beer, liquor or wine almost every day 0 No I have tooth or mouth problems that make it hard for me to eat 0 No I don't always have enough money to buy the food I need 0 No I eat alone most of the time 0 No I take three or more different prescribed or over-the-counter drugs a day 1 Yes Without wanting to, I have lost or gained 10 pounds in the last six months 0 No I am not always physically able to shop, cook and/or feed myself 2 Yes Nutrition Protocols Good Risk Protocol Moderate Risk Protocol 0 Provide education on nutrition High Risk Proctocol Risk Level: Moderate Risk Score: 3 Electronic Signature(s) Signed: 03/14/2020 5:50:16 PM By: [name] RN Entered By: [name] on 02/29/2020 15:19:13

## 2020-03-15 NOTE — Progress Notes (Signed)
[name], [name] ([id number]) Visit Report for 02/29/2020 Allergy List Details Patient Name: Date of Service: [name] RIS 02/29/2020 2:45 PM Medical Record Number: [id number] Patient Account Number: [id number] Date of Birth/Sex: Treating RN: [birth year] (74 y.o. [name] Primary Care Jacorian Golaszewski: [name] Other Clinician: Referring Chaunice Obie: Treating Bellany Elbaum/Extender: Raynelle Chary in Treatment: 0 Allergies Active Allergies ibuprofen Sulfa (Sulfonamide Antibiotics) tramadol doxycycline Hydrocil Bactrim red dye Allergy Notes Electronic Signature(s) Signed: 03/14/2020 5:50:16 PM By: [name] RN Entered By: [name] on 02/29/2020 15:12:03 -------------------------------------------------------------------------------- Arrival Information Details Patient Name: Date of Service: [name] Mode, DO RIS 02/29/2020 2:45 PM Medical Record Number: [id number] Patient Account Number: [id number] Date of Birth/Sex: Treating RN: Aug 10, 1945 (74 y.o. [name] Primary Care Lalia Loudon: [name] Other Clinician: Referring Makaylia Hewett: Treating Duff Pozzi/Extender: Raynelle Chary in Treatment: 0 Visit Information Patient Arrived: Wheel Chair Arrival Time: 14:47 Accompanied By: self Transfer Assistance: None Patient Identification Verified: Yes Secondary Verification Process Completed: Yes Patient Has Alerts: Yes Patient Alerts: Patient on Blood Thinner unable to obtain ABI on R Electronic Signature(s) Signed: 03/14/2020 5:50:16 PM By: [name] RN Signed: 03/14/2020 5:50:16 PM By: [name] RN Entered By: [name] on 02/29/2020 15:38:15 -------------------------------------------------------------------------------- Clinic Level of Care Assessment Details Patient Name: Date of Service: [name] Mode, DO RIS 02/29/2020 2:45 PM Medical Record Number: [id number] Patient Account Number:  [id number] Date of Birth/Sex: Treating RN: January 19, 1946 (74 y.o. [name] Primary Care Neshia Mckenzie: [name] Other Clinician: Referring Emanuella Nickle: Treating Ethin Drummond/Extender: Raynelle Chary in Treatment: 0 Clinic Level of Care Assessment Items TOOL 2 Quantity Score X- 1 0 Use when only an EandM is performed on the INITIAL visit ASSESSMENTS - Nursing Assessment / Reassessment X- 1 20 General Physical Exam (combine w/ comprehensive assessment (listed just below) when performed on new pt. evals) X- 1 25 Comprehensive Assessment (HX, ROS, Risk Assessments, Wounds Hx, etc.) ASSESSMENTS - Wound and Skin A ssessment / Reassessment []  - 0 Simple Wound Assessment / Reassessment - one wound X- 2 5 Complex Wound Assessment / Reassessment - multiple wounds []  - 0 Dermatologic / Skin Assessment (not related to wound area) ASSESSMENTS - Ostomy and/or Continence Assessment and Care []  - 0 Incontinence Assessment and Management []  - 0 Ostomy Care Assessment and Management (repouching, etc.) PROCESS - Coordination of Care X - Simple Patient / Family Education for ongoing care 1 15 []  - 0 Complex (extensive) Patient / Family Education for ongoing care X- 1 10 Staff obtains Programmer, systems, Records, T Results / Process Orders est []  - 0 Staff telephones HHA, Nursing Homes / Clarify orders / etc []  - 0 Routine Transfer to another Facility (non-emergent condition) []  - 0 Routine Hospital Admission (non-emergent condition) X- 1 15 New Admissions / Biomedical engineer / Ordering NPWT Apligraf, etc. , []  - 0 Emergency Hospital Admission (emergent condition) X- 1 10 Simple Discharge Coordination []  - 0 Complex (extensive) Discharge Coordination PROCESS - Special Needs []  - 0 Pediatric / Minor Patient Management []  - 0 Isolation Patient Management []  - 0 Hearing / Language / Visual special needs []  - 0 Assessment of Community assistance  (transportation, D/C planning, etc.) []  - 0 Additional assistance / Altered mentation []  - 0 Support Surface(s) Assessment (bed, cushion, seat, etc.) INTERVENTIONS - Wound Cleansing / Measurement X- 1 5 Wound Imaging (photographs - any number of wounds) []  - 0 Wound Tracing (instead of photographs) []  -  0 Simple Wound Measurement - one wound X- 2 5 Complex Wound Measurement - multiple wounds []  - 0 Simple Wound Cleansing - one wound X- 2 5 Complex Wound Cleansing - multiple wounds INTERVENTIONS - Wound Dressings X - Small Wound Dressing one or multiple wounds 2 10 []  - 0 Medium Wound Dressing one or multiple wounds []  - 0 Large Wound Dressing one or multiple wounds []  - 0 Application of Medications - injection INTERVENTIONS - Miscellaneous []  - 0 External ear exam []  - 0 Specimen Collection (cultures, biopsies, blood, body fluids, etc.) []  - 0 Specimen(s) / Culture(s) sent or taken to Lab for analysis []  - 0 Patient Transfer (multiple staff / Civil Service fast streamer / Similar devices) []  - 0 Simple Staple / Suture removal (25 or less) []  - 0 Complex Staple / Suture removal (26 or more) []  - 0 Hypo / Hyperglycemic Management (close monitor of Blood Glucose) X- 1 15 Ankle / Brachial Index (ABI) - do not check if billed separately Has the patient been seen at the hospital within the last three years: Yes Total Score: 165 Level Of Care: New/Established - Level 5 Electronic Signature(s) Signed: 02/29/2020 4:53:28 PM By: Kela Millin Entered By: Kela Millin on 02/29/2020 15:55:49 -------------------------------------------------------------------------------- Encounter Discharge Information Details Patient Name: Date of Service: [name], DO RIS 02/29/2020 2:45 PM Medical Record Number: [id number] Patient Account Number: [id number] Date of Birth/Sex: Treating RN: 10-29-1945 (74 y.o. [name] Primary Care Richanda Darin: [name] Other Clinician: Referring  Ellice Boultinghouse: Treating Junia Nygren/Extender: Raynelle Chary in Treatment: 0 Encounter Discharge Information Items Discharge Condition: Stable Ambulatory Status: Wheelchair Discharge Destination: Home Transportation: Private Auto Accompanied By: self Schedule Follow-up Appointment: Yes Clinical Summary of Care: Patient Declined Electronic Signature(s) Signed: 02/29/2020 5:26:58 PM By: Baruch Gouty RN, BSN Entered By: Baruch Gouty on 02/29/2020 16:48:18 -------------------------------------------------------------------------------- Lower Extremity Assessment Details Patient Name: Date of Service: [name] Mode, DO RIS 02/29/2020 2:45 PM Medical Record Number: [id number] Patient Account Number: [id number] Date of Birth/Sex: Treating RN: Jul 30, 1945 (74 y.o. [name] Primary Care Griffin Dewilde: [name] Other Clinician: Referring Gunnard Dorrance: Treating Danique Hartsough/Extender: Raynelle Chary in Treatment: 0 Edema Assessment Assessed: [Left: No] [Right: No] [Left: Edema] [Right: :] Calf Left: Right: Point of Measurement: 38 cm From Medial Instep 34 cm 37 cm Ankle Left: Right: Point of Measurement: 9 cm From Medial Instep 22 cm 22 cm Vascular Assessment Blood Pressure: Brachial: [Left:156] Ankle: [Left:Posterior Tibial: 130 0.83] Notes unablwe to obtain right Electronic Signature(s) Signed: 03/14/2020 5:50:16 PM By: [name] RN Entered By: [name] on 02/29/2020 15:37:20 -------------------------------------------------------------------------------- Multi-Disciplinary Care Plan Details Patient Name: Date of Service: [name] Mode, DO RIS 02/29/2020 2:45 PM Medical Record Number: [id number] Patient Account Number: [id number] Date of Birth/Sex: Treating RN: 05/17/1946 (74 y.o. [name] Primary Care Samara Stankowski: [name] Other Clinician: Referring Candie Gintz: Treating Turquoise Esch/Extender: Raynelle Chary in Treatment: 0 Active Inactive Nutrition Nursing Diagnoses: Impaired glucose control: actual or potential Goals: Patient/caregiver verbalizes understanding of need to maintain therapeutic glucose control per primary care physician Date Initiated: 02/29/2020 Target Resolution Date: 04/04/2020 Goal Status: Active Interventions: Provide education on nutrition Notes: Orientation to the Wound Care Program Nursing Diagnoses: Knowledge deficit related to the wound healing center program Goals: Patient/caregiver will verbalize understanding of the [facility] Program Date Initiated: 02/29/2020 Target Resolution Date: 04/04/2020 Goal Status: Active Interventions: Provide education on orientation to the wound center Notes: Pain,  Acute or Chronic Nursing Diagnoses: Pain, acute or chronic: actual or potential Goals: Patient/caregiver will verbalize adequate pain control between visits Date Initiated: 02/29/2020 Target Resolution Date: 04/04/2020 Goal Status: Active Interventions: Provide education on pain management Notes: Electronic Signature(s) Signed: 02/29/2020 4:53:28 PM By: Kela Millin Entered By: Kela Millin on 02/29/2020 15:52:38 -------------------------------------------------------------------------------- Pain Assessment Details Patient Name: Date of Service: [name] Mode, DO RIS 02/29/2020 2:45 PM Medical Record Number: [id number] Patient Account Number: [id number] Date of Birth/Sex: Treating RN: 1945/10/16 (74 y.o. [name] Primary Care Vernell Townley: [name] Other Clinician: Referring Janele Lague: Treating Areesha Dehaven/Extender: Raynelle Chary in Treatment: 0 Active Problems Location of Pain Severity and Description of Pain Patient Has Paino No Site Locations With Dressing Change: Yes Duration of the Pain. Constant / Intermittento Intermittent How Long Does it  Lasto Hours: Minutes: 15 Rate the pain. Current Pain Level: 2 Worst Pain Level: 6 Least Pain Level: 0 Tolerable Pain Level: 5 Character of Pain Describe the Pain: Aching Pain Management and Medication Current Pain Management: Medication: Yes Cold Application: No Rest: Yes Massage: No Activity: No T.E.N.S.: No Heat Application: No Leg drop or elevation: No Is the Current Pain Management Adequate: Inadequate How does your wound impact your activities of daily livingo Sleep: Yes Bathing: No Appetite: Yes Relationship With Others: No Bladder Continence: No Emotions: No Bowel Continence: No Work: No Toileting: No Drive: No Dressing: No Hobbies: No Electronic Signature(s) Signed: 03/14/2020 5:50:16 PM By: [name] RN Entered By: [name] on 02/29/2020 15:39:38 -------------------------------------------------------------------------------- Patient/Caregiver Education Details Patient Name: Date of Service: [name] Mode, DO RIS 8/13/2021andnbsp2:45 PM Medical Record Number: [id number] Patient Account Number: [id number] Date of Birth/Gender: Treating RN: 1945-09-08 (74 y.o. [name] Primary Care Physician: [name] Other Clinician: Referring Physician: Treating Physician/Extender: Raynelle Chary in Treatment: 0 Education Assessment Education Provided To: Patient Education Topics Provided Nutrition: Handouts: Elevated Blood Sugars: How Do They Affect Wound Healing Methods: Explain/Verbal Responses: State content correctly Pain: Handouts: A Guide to Pain Control Methods: Explain/Verbal Responses: State content correctly Welcome T [facility]: o Handouts: Welcome T [facility] [place] o Methods: Explain/Verbal Responses: State content correctly Electronic Signature(s) Signed: 02/29/2020 4:53:28 PM By: Kela Millin Entered By: Kela Millin on 02/29/2020  15:52:59 -------------------------------------------------------------------------------- Wound Assessment Details Patient Name: Date of Service: [name] Mode, DO RIS 02/29/2020 2:45 PM Medical Record Number: [id number] Patient Account Number: [id number] Date of Birth/Sex: Treating RN: 11/12/1945 (74 y.o. [name] Primary Care Jullianna Gabor: [name] Other Clinician: Referring Alden Bensinger: Treating Eliazar Olivar/Extender: Raynelle Chary in Treatment: 0 Wound Status Wound Number: 1 Primary Diabetic Wound/Ulcer of the Lower Extremity Etiology: Wound Location: Right, Posterior Lower Leg Wound Open Wounding Event: Gradually Appeared Status: Date Acquired: 01/17/2020 Comorbid Chronic Obstructive Pulmonary Disease (COPD), Hypertension, Weeks Of Treatment: 0 History: Myocardial Infarction, Type II Diabetes Clustered Wound: No Photos Photo Uploaded By: [name] on 03/03/2020 11:36:50 Wound Measurements Length: (cm) 0.5 Width: (cm) 0.5 Depth: (cm) 0.2 Area: (cm) 0.196 Volume: (cm) 0.039 % Reduction in Area: % Reduction in Volume: Epithelialization: None Tunneling: No Undermining: No Wound Description Classification: Grade 2 Exudate Amount: Large Exudate Type: Serosanguineous Exudate Color: red, brown Foul Odor After Cleansing: No Slough/Fibrino Yes Wound Bed Granulation Amount: None Present (0%) Exposed Structure Necrotic Amount: Large (67-100%) Fascia Exposed: No Necrotic Quality: Adherent Slough Fat Layer (Subcutaneous Tissue) Exposed: No Tendon Exposed: No Muscle Exposed: No Joint Exposed: No  Bone Exposed: No Electronic Signature(s) Signed: 03/14/2020 5:50:16 PM By: [name] RN Entered By: [name] on 02/29/2020 15:34:49 -------------------------------------------------------------------------------- Wound Assessment Details Patient Name: Date of Service: [name] Mode, DO RIS 02/29/2020 2:45 PM Medical Record Number:  [id number] Patient Account Number: [id number] Date of Birth/Sex: Treating RN: Jul 30, 1945 (74 y.o. [name] Primary Care Tiarra Anastacio: [name] Other Clinician: Referring Arliss Frisina: Treating Jennah Satchell/Extender: Raynelle Chary in Treatment: 0 Wound Status Wound Number: 2 Primary Diabetic Wound/Ulcer of the Lower Extremity Etiology: Wound Location: Left, Anterior Lower Leg Wound Open Wounding Event: Gradually Appeared Status: Date Acquired: 01/17/2020 Comorbid Chronic Obstructive Pulmonary Disease (COPD), Hypertension, Weeks Of Treatment: 0 History: Myocardial Infarction, Type II Diabetes Clustered Wound: No Photos Photo Uploaded By: [name] on 03/03/2020 11:36:50 Wound Measurements Length: (cm) 1 Width: (cm) 1.1 Depth: (cm) 0.1 Area: (cm) 0.864 Volume: (cm) 0.086 % Reduction in Area: % Reduction in Volume: Epithelialization: None Tunneling: No Undermining: No Wound Description Classification: Grade 2 Exudate Amount: Large Exudate Type: Serosanguineous Exudate Color: red, brown Foul Odor After Cleansing: No Slough/Fibrino Yes Wound Bed Granulation Amount: Small (1-33%) Exposed Structure Granulation Quality: Pink Fascia Exposed: No Necrotic Amount: Large (67-100%) Fat Layer (Subcutaneous Tissue) Exposed: Yes Necrotic Quality: Adherent Slough Tendon Exposed: No Muscle Exposed: No Joint Exposed: No Bone Exposed: No Electronic Signature(s) Signed: 03/14/2020 5:50:16 PM By: [name] RN Entered By: [name] on 02/29/2020 15:36:47 -------------------------------------------------------------------------------- Vitals Details Patient Name: Date of Service: [name] Mode, DO RIS 02/29/2020 2:45 PM Medical Record Number: [id number] Patient Account Number: [id number] Date of Birth/Sex: Treating RN: 08/05/1945 (74 y.o. [name] Primary Care Marshelle Bilger: [name] Other Clinician: Referring  Prerna Harold: Treating Boston Cookson/Extender: Raynelle Chary in Treatment: 0 Vital Signs Time Taken: 15:02 Temperature (F): 98.2 Height (in): 60 Pulse (bpm): 84 Source: Stated Respiratory Rate (breaths/min): 18 Weight (lbs): 160 Blood Pressure (mmHg): 156/76 Source: Stated Capillary Blood Glucose (mg/dl): 220 Body Mass Index (BMI): 31.2 Reference Range: 80 - 120 mg / dl Electronic Signature(s) Signed: 03/14/2020 5:50:16 PM By: [name] RN Entered By: [name] on 02/29/2020 15:07:02

## 2020-03-16 DIAGNOSIS — Z794 Long term (current) use of insulin: Secondary | ICD-10-CM | POA: Diagnosis not present

## 2020-03-16 DIAGNOSIS — J449 Chronic obstructive pulmonary disease, unspecified: Secondary | ICD-10-CM | POA: Diagnosis not present

## 2020-03-16 DIAGNOSIS — H9190 Unspecified hearing loss, unspecified ear: Secondary | ICD-10-CM | POA: Diagnosis not present

## 2020-03-16 DIAGNOSIS — G4730 Sleep apnea, unspecified: Secondary | ICD-10-CM | POA: Diagnosis not present

## 2020-03-16 DIAGNOSIS — Z940 Kidney transplant status: Secondary | ICD-10-CM | POA: Diagnosis not present

## 2020-03-16 DIAGNOSIS — Z992 Dependence on renal dialysis: Secondary | ICD-10-CM | POA: Diagnosis not present

## 2020-03-16 DIAGNOSIS — Z952 Presence of prosthetic heart valve: Secondary | ICD-10-CM | POA: Diagnosis not present

## 2020-03-16 DIAGNOSIS — M1030 Gout due to renal impairment, unspecified site: Secondary | ICD-10-CM | POA: Diagnosis not present

## 2020-03-16 DIAGNOSIS — K8020 Calculus of gallbladder without cholecystitis without obstruction: Secondary | ICD-10-CM | POA: Diagnosis not present

## 2020-03-16 DIAGNOSIS — I2510 Atherosclerotic heart disease of native coronary artery without angina pectoris: Secondary | ICD-10-CM | POA: Diagnosis not present

## 2020-03-16 DIAGNOSIS — Z87891 Personal history of nicotine dependence: Secondary | ICD-10-CM | POA: Diagnosis not present

## 2020-03-16 DIAGNOSIS — I5033 Acute on chronic diastolic (congestive) heart failure: Secondary | ICD-10-CM | POA: Diagnosis not present

## 2020-03-16 DIAGNOSIS — D509 Iron deficiency anemia, unspecified: Secondary | ICD-10-CM | POA: Diagnosis not present

## 2020-03-16 DIAGNOSIS — J9611 Chronic respiratory failure with hypoxia: Secondary | ICD-10-CM | POA: Diagnosis not present

## 2020-03-16 DIAGNOSIS — K5730 Diverticulosis of large intestine without perforation or abscess without bleeding: Secondary | ICD-10-CM | POA: Diagnosis not present

## 2020-03-16 DIAGNOSIS — Z87440 Personal history of urinary (tract) infections: Secondary | ICD-10-CM | POA: Diagnosis not present

## 2020-03-16 DIAGNOSIS — K2100 Gastro-esophageal reflux disease with esophagitis, without bleeding: Secondary | ICD-10-CM | POA: Diagnosis not present

## 2020-03-16 DIAGNOSIS — Z7901 Long term (current) use of anticoagulants: Secondary | ICD-10-CM | POA: Diagnosis not present

## 2020-03-16 DIAGNOSIS — N186 End stage renal disease: Secondary | ICD-10-CM | POA: Diagnosis not present

## 2020-03-16 DIAGNOSIS — I4819 Other persistent atrial fibrillation: Secondary | ICD-10-CM | POA: Diagnosis not present

## 2020-03-16 DIAGNOSIS — E785 Hyperlipidemia, unspecified: Secondary | ICD-10-CM | POA: Diagnosis not present

## 2020-03-16 DIAGNOSIS — E1122 Type 2 diabetes mellitus with diabetic chronic kidney disease: Secondary | ICD-10-CM | POA: Diagnosis not present

## 2020-03-16 DIAGNOSIS — I152 Hypertension secondary to endocrine disorders: Secondary | ICD-10-CM | POA: Diagnosis not present

## 2020-03-16 DIAGNOSIS — C3411 Malignant neoplasm of upper lobe, right bronchus or lung: Secondary | ICD-10-CM | POA: Diagnosis not present

## 2020-03-16 MED ORDER — OXYCODONE-ACETAMINOPHEN 5-325 MG PO TABS
1.0000 | ORAL_TABLET | Freq: Three times a day (TID) | ORAL | 0 refills | Status: DC | PRN
Start: 2020-03-16 — End: 2020-04-22

## 2020-03-16 NOTE — Progress Notes (Signed)
WILL, SCHIER ([id number]) Visit Report for 03/14/2020 HPI Details Patient Name: Date of Service: [name] 03/14/2020 2:30 PM Medical Record Number: [id number] Patient Account Number: [id number] Date of Birth/Sex: Treating RN: 09-11-1945 (74 y.o. [name] Primary Care Provider: [name] Other Clinician: Referring Provider: Treating Provider/Extender: [name] in Treatment: 2 History of Present Illness HPI Description: 74 year old female with bilateral leg wounds one on the left anterior leg and 1 right posterior leg for about 6 weeks, lately she has noted excessive drainage that soaks towels at night by the time she wakes up in the morning, especially from the left shin wound. Patient stated this started out as pinholes and became bigger. She had a couple 1 on the left and one on the right more distal near the feet close up on their own. She has been using Neosporin and clobetasol cream to the area and using gauze dressing. ABIs today right unobtainable, left 0.8, Interestingly her ABIs in 2018 were 0.47 on the right and 0.71 on the left, Performed by Dr. Earleen Newport Patient's history is complex with post renal transplant and kidney disease stage IV, history of lung cancer status post XRT insulin requiring diabetes, chronic A. , fib status post Maze procedure, diastolic heart failure, A. fib on Coumadin, aortic stenosis status post AVR, OSA without CPAP use. Patient denies any fevers chills shakes, has significant degree of pain in the shin area and the wound and has difficulty having any pressure on it 8/27; patient was admitted to clinic on 8/13. She has punched out areas on the left lateral and right lateral. Neither 1 of these have viable surfaces. We used silver alginate on them when she first came in she has 3+ pitting edema bilaterally in her lower extremities and some degree of PAD. We ordered arterial studies on her last time I  do not know that those have been done. The patient complains of pain in the legs stating the wraps were too tight. She has home health I think changing the dressings. She says she was in the ER on Monday because of pain. She has chronic kidney disease stage IV and is on very high doses of torsemide Addendum; after patient left the clinic were able to resurrect her arterial studies from 8/23. This showed on the right and ABI 1.58 on IV noncompressible waveforms were monophasic great toe waveform was absent. On the left ABI at 1.58 again great toe waveform was absent. Monophasic waveforms Electronic Signature(s) Signed: 03/16/2020 7:39:18 AM By: Linton Ham MD Entered By: Linton Ham on 03/14/2020 16:04:42 -------------------------------------------------------------------------------- Physical Exam Details Patient Name: Date of Service: [name], DO RIS 03/14/2020 2:30 PM Medical Record Number: [id number] Patient Account Number: [id number] Date of Birth/Sex: Treating RN: 21-Jul-1945 (74 y.o. [name] Primary Care Provider: [name] Other Clinician: Referring Provider: Treating Provider/Extender: [name] in Treatment: 2 Constitutional Patient is hypertensive.. Pulse regular and within target range for patient.Marland Kitchen Respirations regular, non-labored and within target range.. Temperature is normal and within the target range for the patient.Marland Kitchen Appears in no distress. Respiratory Shallow but otherwise clear. Cardiovascular No convincing evidence of CHF. Pedal pulses absent bilaterally.. 3+ edema in both lower legs this is pitting. Notes Wound exam; left calf ulcer nonviable surface. There is a smaller area on the right side but the same nonviable surface Electronic Signature(s) Signed: 03/16/2020 7:39:18 AM By: Linton Ham MD Entered By: Linton Ham on 03/14/2020  16:03:30 -------------------------------------------------------------------------------- Physician Orders Details Patient Name: Date of Service: [name] 03/14/2020 2:30 PM Medical Record Number: [id number] Patient Account Number: [id number] Date of Birth/Sex: Treating RN: 1946-07-04 (74 y.o. [name] Primary Care Provider: [name] Other Clinician: Referring Provider: Treating Provider/Extender: [name] in Treatment: 2 Verbal / Phone Orders: No Diagnosis Coding ICD-10 Coding Code Description [phone] Non-pressure chronic ulcer of right calf limited to breakdown of skin L97.221 Non-pressure chronic ulcer of left calf limited to breakdown of skin P32.95 Chronic diastolic (congestive) heart failure N18.4 Chronic kidney disease, stage 4 (severe) E11.22 Type 2 diabetes mellitus with diabetic chronic kidney disease Follow-up Appointments Return A ppointment in 2 weeks. Other: - HH to change Once a week on weeks that patient comes to wound care center and Twice a when not being seen at wound care center. Dressing Change Frequency Do not change entire dressing for one week. Wound Cleansing May shower with protection. - cast protector Primary Wound Dressing Wound #1 Right,Posterior Lower Leg Iodoflex Wound #2 Left,Anterior Lower Leg Iodoflex Secondary Dressing Wound #1 Right,Posterior Lower Leg ABD pad Zetuvit or Kerramax Drawtex Wound #2 Left,Anterior Lower Leg ABD pad - cushion left posterior lower leg Zetuvit or Kerramax Drawtex Edema Control Kerlix and Coban - Bilateral - LIGHTLY Avoid standing for long periods of time Elevate legs to the level of the heart or above for 30 minutes daily and/or when sitting, a frequency of: [place] skilled nursing for wound care. - [facility]. T change dressing Twice next week. o Electronic Signature(s) Signed: 03/14/2020 5:56:33 PM By: Kela Millin Signed: 03/16/2020 7:39:18 AM By: Linton Ham MD Entered By: Kela Millin on 03/14/2020 15:07:14 -------------------------------------------------------------------------------- Problem List Details Patient Name: Date of Service: [name], DO RIS 03/14/2020 2:30 PM Medical Record Number: [id number] Patient Account Number: [id number] Date of Birth/Sex: Treating RN: 12-Jun-1946 (74 y.o. [name] Primary Care Provider: [name] Other Clinician: Referring Provider: Treating Provider/Extender: [name] in Treatment: 2 Active Problems ICD-10 Encounter Code Description Active Date MDM Diagnosis L97.211 Non-pressure chronic ulcer of right calf limited to breakdown of skin 02/29/2020 No Yes L97.221 Non-pressure chronic ulcer of left calf limited to breakdown of skin 02/29/2020 No Yes T01.60 Chronic diastolic (congestive) heart failure 02/29/2020 No Yes N18.4 Chronic kidney disease, stage 4 (severe) 02/29/2020 No Yes E11.22 Type 2 diabetes mellitus with diabetic chronic kidney disease 02/29/2020 No Yes Inactive Problems Resolved Problems Electronic Signature(s) Signed: 03/16/2020 7:39:18 AM By: Linton Ham MD Entered By: Linton Ham on 03/14/2020 15:56:41 -------------------------------------------------------------------------------- Progress Note Details Patient Name: Date of Service: [name], DO RIS 03/14/2020 2:30 PM Medical Record Number: [id number] Patient Account Number: [id number] Date of Birth/Sex: Treating RN: 08/31/1945 (74 y.o. [name] Primary Care Provider: [name] Other Clinician: Referring Provider: Treating Provider/Extender: [name] in Treatment: 2 Subjective History of Present Illness (HPI) 74 year old female with bilateral leg wounds one on the left anterior leg and 1 right posterior leg for about 6 weeks, lately she has noted  excessive drainage that soaks towels at night by the time she wakes up in the morning, especially from the left shin wound. Patient stated this started out as pinholes and became bigger. She had a couple 1 on the left and one on the right more distal near the feet close up on their own. She has been using Neosporin and clobetasol cream to  the area and using gauze dressing. ABIs today right unobtainable, left 0.8, Interestingly her ABIs in 2018 were 0.47 on the right and 0.71 on the left, Performed by Dr. Earleen Newport Patient's history is complex with post renal transplant and kidney disease stage IV, history of lung cancer status post XRT insulin requiring diabetes, chronic A. , fib status post Maze procedure, diastolic heart failure, A. fib on Coumadin, aortic stenosis status post AVR, OSA without CPAP use. Patient denies any fevers chills shakes, has significant degree of pain in the shin area and the wound and has difficulty having any pressure on it 8/27; patient was admitted to clinic on 8/13. She has punched out areas on the left lateral and right lateral. Neither 1 of these have viable surfaces. We used silver alginate on them when she first came in she has 3+ pitting edema bilaterally in her lower extremities and some degree of PAD. We ordered arterial studies on her last time I do not know that those have been done. The patient complains of pain in the legs stating the wraps were too tight. She has home health I think changing the dressings. She says she was in the ER on Monday because of pain. She has chronic kidney disease stage IV and is on very high doses of torsemide Addendum; after patient left the clinic were able to resurrect her arterial studies from 8/23. This showed on the right and ABI 1.58 on IV noncompressible waveforms were monophasic great toe waveform was absent. On the left ABI at 1.58 again great toe waveform was absent. Monophasic waveforms Objective Constitutional Patient is  hypertensive.. Pulse regular and within target range for patient.Marland Kitchen Respirations regular, non-labored and within target range.. Temperature is normal and within the target range for the patient.Marland Kitchen Appears in no distress. Vitals Time Taken: 2:34 PM, Height: 60 in, Weight: 160 lbs, BMI: 31.2, Temperature: 98.2 F, Pulse: 82 bpm, Respiratory Rate: 20 breaths/min, Blood Pressure: 149/87 mmHg, Capillary Blood Glucose: 290 mg/dl. General Notes: CBG per patient Respiratory Shallow but otherwise clear. Cardiovascular No convincing evidence of CHF. Pedal pulses absent bilaterally.. 3+ edema in both lower legs this is pitting. General Notes: Wound exam; left calf ulcer nonviable surface. There is a smaller area on the right side but the same nonviable surface Integumentary (Hair, Skin) Wound #1 status is Open. Original cause of wound was Gradually Appeared. The wound is located on the Right,Posterior Lower Leg. The wound measures 0.6cm length x 0.7cm width x 0.5cm depth; 0.33cm^2 area and 0.165cm^3 volume. There is no tunneling or undermining noted. There is a large amount of serosanguineous drainage noted. There is no granulation within the wound bed. There is a large (67-100%) amount of necrotic tissue within the wound bed including Adherent Slough. Wound #2 status is Open. Original cause of wound was Gradually Appeared. The wound is located on the Left,Anterior Lower Leg. The wound measures 1cm length x 1.2cm width x 0.2cm depth; 0.942cm^2 area and 0.188cm^3 volume. There is Fat Layer (Subcutaneous Tissue) exposed. There is no tunneling or undermining noted. There is a large amount of serosanguineous drainage noted. There is small (1-33%) pink granulation within the wound bed. There is a large (67-100%) amount of necrotic tissue within the wound bed including Adherent Slough. Assessment Active Problems ICD-10 Non-pressure chronic ulcer of right calf limited to breakdown of skin Non-pressure chronic  ulcer of left calf limited to breakdown of skin Chronic diastolic (congestive) heart failure Chronic kidney disease, stage 4 (severe) Type 2 diabetes mellitus  with diabetic chronic kidney disease Plan Follow-up Appointments: Return Appointment in 2 weeks. Other: - HH to change Once a week on weeks that patient comes to wound care center and Twice a when not being seen at wound care center. Dressing Change Frequency: Do not change entire dressing for one week. Wound Cleansing: May shower with protection. - cast protector Primary Wound Dressing: Wound #1 Right,Posterior Lower Leg: Iodoflex Wound #2 Left,Anterior Lower Leg: Iodoflex Secondary Dressing: Wound #1 Right,Posterior Lower Leg: ABD pad Zetuvit or Kerramax Drawtex Wound #2 Left,Anterior Lower Leg: ABD pad - cushion left posterior lower leg Zetuvit or Kerramax Drawtex Edema Control: Kerlix and Coban - Bilateral - LIGHTLY Avoid standing for long periods of time Elevate legs to the level of the heart or above for 30 minutes daily and/or when sitting, a frequency of: Home Health: [place] skilled nursing for wound care. - [facility]. T change dressing Twice next week. o 1. The patient likely has severe PAD. She has stage IV chronic renal failure I am not certain whether they could even do an angiogram on her certainly not using standard contrast. Perhaps a CO2 angiogram 2. She is complaining of pain in her legs but I think this is probably ischemic. 3. This is a very difficult case. I change the primary dressing to Iodoflex we are not going to be able to use more than kerlix Coban on her. 4. I think her wounds are ischemic Electronic Signature(s) Signed: 03/16/2020 7:39:18 AM By: Linton Ham MD Entered By: Linton Ham on 03/14/2020 16:06:11 -------------------------------------------------------------------------------- SuperBill Details Patient Name: Date of Service: [name], DO RIS 03/14/2020 Medical  Record Number: [id number] Patient Account Number: [id number] Date of Birth/Sex: Treating RN: Apr 20, 1946 (74 y.o. [name] Primary Care Provider: [name] Other Clinician: Referring Provider: Treating Provider/Extender: [name] in Treatment: 2 Diagnosis Coding ICD-10 Codes Code Description [phone] Non-pressure chronic ulcer of right calf limited to breakdown of skin L97.221 Non-pressure chronic ulcer of left calf limited to breakdown of skin K09.38 Chronic diastolic (congestive) heart failure N18.4 Chronic kidney disease, stage 4 (severe) E11.22 Type 2 diabetes mellitus with diabetic chronic kidney disease Facility Procedures CPT4 Code: [id number] Description: 99214 - WOUND CARE VISIT-LEV 4 EST PT Modifier: Quantity: 1 Physician Procedures : CPT4 Code Description Modifier [id number] - WC PHYS LEVEL 4 - EST PT ICD-10 Diagnosis Description L97.211 Non-pressure chronic ulcer of right calf limited to breakdown of skin L97.221 Non-pressure chronic ulcer of left calf limited to breakdown of  skin N18.4 Chronic kidney disease, stage 4 (severe) Quantity: 1 Electronic Signature(s) Signed: 03/16/2020 7:39:18 AM By: Linton Ham MD Entered By: Linton Ham on 03/14/2020 16:08:00

## 2020-03-16 NOTE — Progress Notes (Signed)
[name], [name] ([id number]) Visit Report for 03/14/2020 Arrival Information Details Patient Name: Date of Service: [name] RIS 03/14/2020 2:30 PM Medical Record Number: [id number] Patient Account Number: [id number] Date of Birth/Sex: Treating RN: Oct 10, 1945 (74 y.o. [name] Primary Care Thad Osoria: [name] Other Clinician: Referring Cassadie Pankonin: Treating Sarabelle Genson/Extender: [name] in Treatment: 2 Visit Information History Since Last Visit All ordered tests and consults were completed: No Patient Arrived: Wheel Chair Added or deleted any medications: No Arrival Time: 14:31 Any new allergies or adverse reactions: No Accompanied By: daughter Had a fall or experienced change in No Transfer Assistance: None activities of daily living that may affect Patient Identification Verified: Yes risk of falls: Secondary Verification Process Completed: Yes Signs or symptoms of abuse/neglect since last visito No Patient Has Alerts: Yes Hospitalized since last visit: No Patient Alerts: Patient on Blood Thinner Implantable device outside of the clinic excluding No unable to obtain ABI on R cellular tissue based products placed in the center since last visit: Has Dressing in Place as Prescribed: Yes Has Compression in Place as Prescribed: No Pain Present Now: No Electronic Signature(s) Signed: 03/14/2020 5:49:17 PM By: [name] RN Entered By: [name] on 03/14/2020 14:34:03 -------------------------------------------------------------------------------- Clinic Level of Care Assessment Details Patient Name: Date of Service: [name] RIS 03/14/2020 2:30 PM Medical Record Number: [id number] Patient Account Number: [id number] Date of Birth/Sex: Treating RN: 09/01/1945 (74 y.o. [name] [name] Primary Care Praneel Haisley: [name] Other Clinician: Referring Naija Troost: Treating Kalliopi Coupland/Extender: [name] in Treatment: 2 Clinic Level of Care Assessment Items TOOL 4 Quantity Score X- 1 0 Use when only an EandM is performed on FOLLOW-UP visit ASSESSMENTS - Nursing Assessment / Reassessment X- 1 10 Reassessment of Co-morbidities (includes updates in patient status) X- 1 5 Reassessment of Adherence to Treatment Plan ASSESSMENTS - Wound and Skin A ssessment / Reassessment []  - 0 Simple Wound Assessment / Reassessment - one wound X- 2 5 Complex Wound Assessment / Reassessment - multiple wounds []  - 0 Dermatologic / Skin Assessment (not related to wound area) ASSESSMENTS - Focused Assessment X- 2 5 Circumferential Edema Measurements - multi extremities []  - 0 Nutritional Assessment / Counseling / Intervention []  - 0 Lower Extremity Assessment (monofilament, tuning fork, pulses) []  - 0 Peripheral Arterial Disease Assessment (using hand held doppler) ASSESSMENTS - Ostomy and/or Continence Assessment and Care []  - 0 Incontinence Assessment and Management []  - 0 Ostomy Care Assessment and Management (repouching, etc.) PROCESS - Coordination of Care X - Simple Patient / Family Education for ongoing care 1 15 []  - 0 Complex (extensive) Patient / Family Education for ongoing care X- 1 10 Staff obtains Programmer, systems, Records, T Results / Process Orders est X- 1 10 Staff telephones HHA, Nursing Homes / Clarify orders / etc []  - 0 Routine Transfer to another Facility (non-emergent condition) []  - 0 Routine Hospital Admission (non-emergent condition) []  - 0 New Admissions / Biomedical engineer / Ordering NPWT Apligraf, etc. , []  - 0 Emergency Hospital Admission (emergent condition) X- 1 10 Simple Discharge Coordination []  - 0 Complex (extensive) Discharge Coordination PROCESS - Special Needs []  - 0 Pediatric / Minor Patient Management []  - 0 Isolation Patient Management []  - 0 Hearing / Language / Visual special needs []  - 0 Assessment of Community  assistance (transportation, D/C planning, etc.) []  - 0 Additional assistance / Altered mentation []  - 0 Support Surface(s) Assessment (bed, cushion, seat,  etc.) INTERVENTIONS - Wound Cleansing / Measurement []  - 0 Simple Wound Cleansing - one wound X- 2 5 Complex Wound Cleansing - multiple wounds X- 1 5 Wound Imaging (photographs - any number of wounds) []  - 0 Wound Tracing (instead of photographs) []  - 0 Simple Wound Measurement - one wound X- 2 5 Complex Wound Measurement - multiple wounds INTERVENTIONS - Wound Dressings X - Small Wound Dressing one or multiple wounds 1 10 []  - 0 Medium Wound Dressing one or multiple wounds []  - 0 Large Wound Dressing one or multiple wounds X- 1 5 Application of Medications - topical []  - 0 Application of Medications - injection INTERVENTIONS - Miscellaneous []  - 0 External ear exam []  - 0 Specimen Collection (cultures, biopsies, blood, body fluids, etc.) []  - 0 Specimen(s) / Culture(s) sent or taken to Lab for analysis []  - 0 Patient Transfer (multiple staff / Civil Service fast streamer / Similar devices) []  - 0 Simple Staple / Suture removal (25 or less) []  - 0 Complex Staple / Suture removal (26 or more) []  - 0 Hypo / Hyperglycemic Management (close monitor of Blood Glucose) []  - 0 Ankle / Brachial Index (ABI) - do not check if billed separately X- 1 5 Vital Signs Has the patient been seen at the hospital within the last three years: Yes Total Score: 125 Level Of Care: New/Established - Level 4 Electronic Signature(s) Signed: 03/14/2020 5:56:33 PM By: Kela Millin Entered By: Kela Millin on 03/14/2020 15:11:39 -------------------------------------------------------------------------------- Encounter Discharge Information Details Patient Name: Date of Service: [name], DO RIS 03/14/2020 2:30 PM Medical Record Number: [id number] Patient Account Number: [id number] Date of Birth/Sex: Treating RN: 02-Feb-1946 (74 y.o. [name] Primary Care Norma Ignasiak: [name] Other Clinician: Referring Zaide Mcclenahan: Treating Melis Trochez/Extender: [name] in Treatment: 2 Encounter Discharge Information Items Discharge Condition: Stable Ambulatory Status: Wheelchair Discharge Destination: Home Transportation: Private Auto Schedule Follow-up Appointment: Yes Clinical Summary of Care: Patient Declined Electronic Signature(s) Signed: 03/14/2020 6:01:16 PM By: Levan Hurst RN, BSN Entered By: Levan Hurst on 03/14/2020 16:42:43 -------------------------------------------------------------------------------- Lower Extremity Assessment Details Patient Name: Date of Service: [name], DO RIS 03/14/2020 2:30 PM Medical Record Number: [id number] Patient Account Number: [id number] Date of Birth/Sex: Treating RN: September 01, 1945 (73 y.o. [name] Primary Care Kalub Morillo: [name] Other Clinician: Referring Daveyon Kitchings: Treating Tenaya Hilyer/Extender: [name] in Treatment: 2 Edema Assessment Assessed: [Left: No] [Right: No] [Left: Edema] [Right: :] Calf Left: Right: Point of Measurement: 38 cm From Medial Instep 34 cm 37 cm Ankle Left: Right: Point of Measurement: 9 cm From Medial Instep 22 cm 22 cm Electronic Signature(s) Signed: 03/14/2020 5:49:17 PM By: [name] RN Entered By: [name] on 03/14/2020 14:35:55 -------------------------------------------------------------------------------- Multi Wound Chart Details Patient Name: Date of Service: [name], DO RIS 03/14/2020 2:30 PM Medical Record Number: [id number] Patient Account Number: [id number] Date of Birth/Sex: Treating RN: 10-11-1945 (74 y.o. [name] [name] Primary Care Emsley Custer: [name] Other Clinician: Referring Veneta Sliter: Treating Tanav Orsak/Extender: [name] in Treatment: 2 Vital Signs Height(in): 60 Capillary  Blood Glucose(mg/dl): 290 Weight(lbs): 160 Pulse(bpm): 63 Body Mass Index(BMI): 31 Blood Pressure(mmHg): 149/87 Temperature(F): 98.2 Respiratory Rate(breaths/min): 20 Photos: [1:No Photos Right, Posterior Lower Leg] [2:No Photos Left, Anterior Lower Leg] [N/A:N/A N/A] Wound Location: [1:Gradually Appeared] [2:Gradually Appeared] [N/A:N/A] Wounding Event: [1:Diabetic Wound/Ulcer of the Lower] [2:Diabetic Wound/Ulcer of the Lower] [N/A:N/A] Primary Etiology: [1:Extremity Chronic Obstructive Pulmonary] [2:Extremity Chronic Obstructive Pulmonary] [  N/A:N/A] Comorbid History: [1:Disease (COPD), Hypertension, Myocardial Infarction, Type II Diabetes Myocardial Infarction, Type II Diabetes 01/17/2020] [2:Disease (COPD), Hypertension, 01/17/2020] [N/A:N/A] Date Acquired: [1:2] [2:2] [N/A:N/A] Weeks of Treatment: [1:Open] [2:Open] [N/A:N/A] Wound Status: [1:0.6x0.7x0.5] [2:1x1.2x0.2] [N/A:N/A] Measurements L x W x D (cm) [1:0.33] [2:0.942] [N/A:N/A] A (cm) : rea [1:0.165] [2:0.188] [N/A:N/A] Volume (cm) : [1:-68.40%] [2:-9.00%] [N/A:N/A] % Reduction in A rea: [1:-323.10%] [2:-118.60%] [N/A:N/A] % Reduction in Volume: [1:Grade 2] [2:Grade 2] [N/A:N/A] Classification: [1:Large] [2:Large] [N/A:N/A] Exudate A mount: [1:Serosanguineous] [2:Serosanguineous] [N/A:N/A] Exudate Type: [1:red, brown] [2:red, brown] [N/A:N/A] Exudate Color: [1:None Present (0%)] [2:Small (1-33%)] [N/A:N/A] Granulation A mount: [1:N/A] [2:Pink] [N/A:N/A] Granulation Quality: [1:Large (67-100%)] [2:Large (67-100%)] [N/A:N/A] Necrotic A mount: [1:Fascia: No] [2:Fat Layer (Subcutaneous Tissue): Yes N/A] Exposed Structures: [1:Fat Layer (Subcutaneous Tissue): No Fascia: No Tendon: No Muscle: No Joint: No Bone: No None] [2:Tendon: No Muscle: No Joint: No Bone: No None] [N/A:N/A] Treatment Notes Electronic Signature(s) Signed: 03/14/2020 5:56:33 PM By: Kela Millin Signed: 03/16/2020 7:39:18 AM By: Linton Ham  MD Entered By: Linton Ham on 03/14/2020 15:56:49 -------------------------------------------------------------------------------- Multi-Disciplinary Care Plan Details Patient Name: Date of Service: [name], DO RIS 03/14/2020 2:30 PM Medical Record Number: [id number] Patient Account Number: [id number] Date of Birth/Sex: Treating RN: 1945-12-29 (74 y.o. [name] [name] Primary Care Emely Fahy: [name] Other Clinician: Referring Everlyn Farabaugh: Treating Karla Pavone/Extender: [name] in Treatment: 2 Active Inactive Nutrition Nursing Diagnoses: Impaired glucose control: actual or potential Goals: Patient/caregiver verbalizes understanding of need to maintain therapeutic glucose control per primary care physician Date Initiated: 02/29/2020 Target Resolution Date: 04/04/2020 Goal Status: Active Interventions: Provide education on nutrition Treatment Activities: Education provided on Nutrition : 03/14/2020 Notes: Orientation to the Wound Care Program Nursing Diagnoses: Knowledge deficit related to the wound healing center program Goals: Patient/caregiver will verbalize understanding of the [facility] [facility] Date Initiated: 02/29/2020 Target Resolution Date: 04/04/2020 Goal Status: Active Interventions: Provide education on orientation to the wound center Notes: Pain, Acute or Chronic Nursing Diagnoses: Pain, acute or chronic: actual or potential Goals: Patient/caregiver will verbalize adequate pain control between visits Date Initiated: 02/29/2020 Target Resolution Date: 04/04/2020 Goal Status: Active Interventions: Provide education on pain management Notes: Electronic Signature(s) Signed: 03/14/2020 5:56:33 PM By: Kela Millin Entered By: Kela Millin on 03/14/2020 15:05:10 -------------------------------------------------------------------------------- Pain Assessment Details Patient Name: Date of  Service: [name], DO RIS 03/14/2020 2:30 PM Medical Record Number: [id number] Patient Account Number: [id number] Date of Birth/Sex: Treating RN: 08-28-1945 (74 y.o. [name] Primary Care Chelesea Weiand: Other Clinician: [name] Referring Luiz Trumpower: Treating Arielis Leonhart/Extender: [name] in Treatment: 2 Active Problems Location of Pain Severity and Description of Pain Patient Has Paino No Site Locations Pain Management and Medication Current Pain Management: Electronic Signature(s) Signed: 03/14/2020 5:49:17 PM By: [name] RN Entered By: [name] on 03/14/2020 14:35:34 -------------------------------------------------------------------------------- Patient/Caregiver Education Details Patient Name: Date of Service: [name], DO RIS 8/27/2021andnbsp2:30 PM Medical Record Number: [id number] Patient Account Number: [id number] Date of Birth/Gender: Treating RN: 04/11/1946 (74 y.o. [name] [name] Primary Care Physician: [name] Other Clinician: Referring Physician: Treating Physician/Extender: [name] in Treatment: 2 Education Assessment Education Provided To: Patient Education Topics Provided Nutrition: Handouts: Elevated Blood Sugars: How Do They Affect Wound Healing Methods: Explain/Verbal Responses: State content correctly Pain: Handouts: A Guide to Pain Control Methods: Explain/Verbal Responses: State content correctly Electronic Signature(s) Signed: 03/14/2020 5:56:33 PM By: Kela Millin Signed: 03/14/2020 5:56:33 PM By:  Dwiggins, Larene Beach Entered By: Kela Millin on 03/14/2020 15:02:03 -------------------------------------------------------------------------------- Wound Assessment Details Patient Name: Date of Service: [name] RIS 03/14/2020 2:30 PM Medical Record Number: [id number] Patient Account Number: [id number] Date of Birth/Sex: Treating  RN: 07-19-1946 (74 y.o. [name] Primary Care Vanna Sailer: [name] Other Clinician: Referring Aracelis Ulrey: Treating Seraiah Nowack/Extender: [name] in Treatment: 2 Wound Status Wound Number: 1 Primary Diabetic Wound/Ulcer of the Lower Extremity Etiology: Wound Location: Right, Posterior Lower Leg Wound Open Wounding Event: Gradually Appeared Status: Date Acquired: 01/17/2020 Comorbid Chronic Obstructive Pulmonary Disease (COPD), Hypertension, Weeks Of Treatment: 2 History: Myocardial Infarction, Type II Diabetes Clustered Wound: No Wound Measurements Length: (cm) 0.6 Width: (cm) 0.7 Depth: (cm) 0.5 Area: (cm) 0.33 Volume: (cm) 0.165 % Reduction in Area: -68.4% % Reduction in Volume: -323.1% Epithelialization: None Tunneling: No Undermining: No Wound Description Classification: Grade 2 Exudate Amount: Large Exudate Type: Serosanguineous Exudate Color: red, brown Foul Odor After Cleansing: No Slough/Fibrino Yes Wound Bed Granulation Amount: None Present (0%) Exposed Structure Necrotic Amount: Large (67-100%) Fascia Exposed: No Necrotic Quality: Adherent Slough Fat Layer (Subcutaneous Tissue) Exposed: No Tendon Exposed: No Muscle Exposed: No Joint Exposed: No Bone Exposed: No Treatment Notes Wound #1 (Right, Posterior Lower Leg) 1. Cleanse With Soap and water 3. Primary Dressing Applied Iodoflex 4. Secondary Dressing ABD Pad Kerramax/Xtrasorb 6. Support Layer Holiday representative) Signed: 03/14/2020 5:49:17 PM By: [name] RN Entered By: [name] on 03/14/2020 14:41:09 -------------------------------------------------------------------------------- Wound Assessment Details Patient Name: Date of Service: [name], DO RIS 03/14/2020 2:30 PM Medical Record Number: [id number] Patient Account Number: [id number] Date of Birth/Sex: Treating RN: 1946-03-09 (74 y.o. [name] Primary  Care Alycen Mack: [name] Other Clinician: Referring Zeppelin Beckstrand: Treating Falecia Vannatter/Extender: [name] in Treatment: 2 Wound Status Wound Number: 2 Primary Diabetic Wound/Ulcer of the Lower Extremity Etiology: Wound Location: Left, Anterior Lower Leg Wound Open Wounding Event: Gradually Appeared Status: Date Acquired: 01/17/2020 Comorbid Chronic Obstructive Pulmonary Disease (COPD), Hypertension, Weeks Of Treatment: 2 History: Myocardial Infarction, Type II Diabetes Clustered Wound: No Wound Measurements Length: (cm) 1 Width: (cm) 1.2 Depth: (cm) 0.2 Area: (cm) 0.942 Volume: (cm) 0.188 % Reduction in Area: -9% % Reduction in Volume: -118.6% Epithelialization: None Tunneling: No Undermining: No Wound Description Classification: Grade 2 Exudate Amount: Large Exudate Type: Serosanguineous Exudate Color: red, brown Foul Odor After Cleansing: No Slough/Fibrino Yes Wound Bed Granulation Amount: Small (1-33%) Exposed Structure Granulation Quality: Pink Fascia Exposed: No Necrotic Amount: Large (67-100%) Fat Layer (Subcutaneous Tissue) Exposed: Yes Necrotic Quality: Adherent Slough Tendon Exposed: No Muscle Exposed: No Joint Exposed: No Bone Exposed: No Treatment Notes Wound #2 (Left, Anterior Lower Leg) 1. Cleanse With Soap and water 3. Primary Dressing Applied Iodoflex 4. Secondary Dressing ABD Pad Kerramax/Xtrasorb 6. Support Layer Holiday representative) Signed: 03/14/2020 5:49:17 PM By: [name] RN Entered By: [name] on 03/14/2020 14:41:27 -------------------------------------------------------------------------------- Vitals Details Patient Name: Date of Service: [name], DO RIS 03/14/2020 2:30 PM Medical Record Number: [id number] Patient Account Number: [id number] Date of Birth/Sex: Treating RN: 05/26/1946 (74 y.o. [name] Primary Care Ketih Goodie: [name] Other  Clinician: Referring Maryella Abood: Treating Katia Hannen/Extender: [name] in Treatment: 2 Vital Signs Time Taken: 14:34 Temperature (F): 98.2 Height (in): 60 Pulse (bpm): 82 Weight (lbs): 160 Respiratory Rate (breaths/min): 20 Body Mass Index (BMI): 31.2 Blood Pressure (mmHg): 149/87 Capillary Blood Glucose (mg/dl): 290 Reference Range: 80 -  120 mg / dl Notes CBG per patient Electronic Signature(s) Signed: 03/14/2020 5:49:17 PM By: [name] RN Entered By: [name] on 03/14/2020 14:35:18

## 2020-03-17 DIAGNOSIS — Z940 Kidney transplant status: Secondary | ICD-10-CM | POA: Diagnosis not present

## 2020-03-17 DIAGNOSIS — N184 Chronic kidney disease, stage 4 (severe): Secondary | ICD-10-CM | POA: Diagnosis not present

## 2020-03-17 NOTE — Telephone Encounter (Signed)
Pt notified that requested prescription has been refilled & pharmacy confirmed.  Pt has no ques/concerns at this time.

## 2020-03-18 NOTE — Telephone Encounter (Signed)
noted 

## 2020-03-20 ENCOUNTER — Telehealth: Payer: Self-pay | Admitting: Cardiology

## 2020-03-20 ENCOUNTER — Ambulatory Visit (INDEPENDENT_AMBULATORY_CARE_PROVIDER_SITE_OTHER): Payer: Medicare Other | Admitting: Pharmacist Clinician (PhC)/ Clinical Pharmacy Specialist

## 2020-03-20 ENCOUNTER — Telehealth: Payer: Self-pay

## 2020-03-20 DIAGNOSIS — I480 Paroxysmal atrial fibrillation: Secondary | ICD-10-CM | POA: Diagnosis not present

## 2020-03-20 DIAGNOSIS — E1122 Type 2 diabetes mellitus with diabetic chronic kidney disease: Secondary | ICD-10-CM | POA: Diagnosis not present

## 2020-03-20 DIAGNOSIS — Z7901 Long term (current) use of anticoagulants: Secondary | ICD-10-CM

## 2020-03-20 DIAGNOSIS — I2510 Atherosclerotic heart disease of native coronary artery without angina pectoris: Secondary | ICD-10-CM | POA: Diagnosis not present

## 2020-03-20 DIAGNOSIS — I5033 Acute on chronic diastolic (congestive) heart failure: Secondary | ICD-10-CM | POA: Diagnosis not present

## 2020-03-20 DIAGNOSIS — Z5181 Encounter for therapeutic drug level monitoring: Secondary | ICD-10-CM

## 2020-03-20 DIAGNOSIS — C3411 Malignant neoplasm of upper lobe, right bronchus or lung: Secondary | ICD-10-CM | POA: Diagnosis not present

## 2020-03-20 DIAGNOSIS — I4819 Other persistent atrial fibrillation: Secondary | ICD-10-CM | POA: Diagnosis not present

## 2020-03-20 DIAGNOSIS — I152 Hypertension secondary to endocrine disorders: Secondary | ICD-10-CM | POA: Diagnosis not present

## 2020-03-20 LAB — POCT INR: INR: 2.5 (ref 2.0–3.0)

## 2020-03-20 NOTE — Telephone Encounter (Signed)
Per Ivin Booty INR was 2.5 PTT 25.7 Pt taking  Coumadin 2.5 mg MWF and 5 mg remaining days Will forward.to CC ./cy

## 2020-03-20 NOTE — Telephone Encounter (Signed)
Prior Authorization initiated for Lidocaine 5% ointment  Key: [id number]

## 2020-03-20 NOTE — Telephone Encounter (Signed)
New message:     [name] calling to report patient INR.

## 2020-03-20 NOTE — Telephone Encounter (Signed)
See anticoag encounter

## 2020-03-21 ENCOUNTER — Ambulatory Visit: Payer: Medicare Other | Admitting: Endocrinology

## 2020-03-25 ENCOUNTER — Other Ambulatory Visit: Payer: Self-pay

## 2020-03-25 NOTE — Patient Outreach (Signed)
[facility]) Care Management  03/25/2020  [name] January 18, 1946 [id number]   Telephone assessment:  Placed call to patient for follow up on DM and wounds. No answer. Left a message requesting a return call.  PLAN: will attempt again in 3 days if no return call.   Tomasa Rand, RN, BSN, CEN [facility] [employer] [phone]

## 2020-03-26 ENCOUNTER — Ambulatory Visit: Payer: Medicare Other | Admitting: Registered"

## 2020-03-26 ENCOUNTER — Other Ambulatory Visit: Payer: Self-pay | Admitting: *Deleted

## 2020-03-26 DIAGNOSIS — I2510 Atherosclerotic heart disease of native coronary artery without angina pectoris: Secondary | ICD-10-CM | POA: Diagnosis not present

## 2020-03-26 DIAGNOSIS — I4819 Other persistent atrial fibrillation: Secondary | ICD-10-CM | POA: Diagnosis not present

## 2020-03-26 DIAGNOSIS — C3411 Malignant neoplasm of upper lobe, right bronchus or lung: Secondary | ICD-10-CM | POA: Diagnosis not present

## 2020-03-26 DIAGNOSIS — I5033 Acute on chronic diastolic (congestive) heart failure: Secondary | ICD-10-CM | POA: Diagnosis not present

## 2020-03-26 DIAGNOSIS — I152 Hypertension secondary to endocrine disorders: Secondary | ICD-10-CM | POA: Diagnosis not present

## 2020-03-26 DIAGNOSIS — E1122 Type 2 diabetes mellitus with diabetic chronic kidney disease: Secondary | ICD-10-CM | POA: Diagnosis not present

## 2020-03-26 NOTE — Progress Notes (Signed)
error 

## 2020-03-27 ENCOUNTER — Ambulatory Visit: Payer: Medicare Other | Admitting: Vascular Surgery

## 2020-03-28 ENCOUNTER — Emergency Department (HOSPITAL_COMMUNITY)
Admission: EM | Admit: 2020-03-28 | Discharge: 2020-03-28 | Disposition: A | Discharge status: Home / Self Care | Payer: Medicare Other | Attending: Emergency Medicine | Admitting: Emergency Medicine

## 2020-03-28 ENCOUNTER — Other Ambulatory Visit: Payer: Self-pay

## 2020-03-28 ENCOUNTER — Encounter (HOSPITAL_COMMUNITY): Payer: Self-pay | Admitting: Emergency Medicine

## 2020-03-28 ENCOUNTER — Encounter (HOSPITAL_BASED_OUTPATIENT_CLINIC_OR_DEPARTMENT_OTHER): Payer: Medicare Other | Attending: Internal Medicine | Admitting: Internal Medicine

## 2020-03-28 DIAGNOSIS — M79604 Pain in right leg: Secondary | ICD-10-CM | POA: Diagnosis not present

## 2020-03-28 DIAGNOSIS — R5381 Other malaise: Secondary | ICD-10-CM | POA: Diagnosis not present

## 2020-03-28 DIAGNOSIS — R52 Pain, unspecified: Secondary | ICD-10-CM | POA: Diagnosis not present

## 2020-03-28 DIAGNOSIS — Z5321 Procedure and treatment not carried out due to patient leaving prior to being seen by health care provider: Secondary | ICD-10-CM | POA: Insufficient documentation

## 2020-03-28 DIAGNOSIS — M79605 Pain in left leg: Secondary | ICD-10-CM | POA: Insufficient documentation

## 2020-03-28 DIAGNOSIS — W19XXXA Unspecified fall, initial encounter: Secondary | ICD-10-CM | POA: Diagnosis not present

## 2020-03-28 NOTE — ED Triage Notes (Signed)
Per [facility]-complaining of lower extremity pain due to varicose veins-fell this am in bathroom-left leg worse than right-on 3L of O2 [place] at home-CBG 206

## 2020-03-28 NOTE — Patient Outreach (Signed)
[facility]) Care Management  03/28/2020  Salimatou Simone [birth year] [id number]   Telephone assessment:  Placed call to patient with no answer. Left a message requesting a call back.  PLAN:Will mail outreach letter today and call back in 4 business days.  Tomasa Rand, RN, BSN, CEN [facility] [employer] [phone]

## 2020-03-28 NOTE — Telephone Encounter (Signed)
PA denied.

## 2020-04-02 ENCOUNTER — Ambulatory Visit: Payer: Medicare Other

## 2020-04-03 ENCOUNTER — Other Ambulatory Visit: Payer: Self-pay

## 2020-04-03 NOTE — Patient Outreach (Signed)
[facility]) Care Management  04/03/2020  Jamerica Snavely 1945-10-23 [id number]   Telephone assessment:  Placed call to patient who answered and reports she is NOT doing well. Reports worsening pain in her legs.  Reports she can not walk. States she called 911 and went to the ED and had to sit in the waiting room for an extended period of time and left because she could not bare to sit in chair with leg pain.  Reports home health nurse with [facility] has not been out to the home this week. Patient reports she is not able to walk and has not weighed this week.  Reports CBG of 84-138 this week. Reports today of 138.  Report husband bought a bed pan and a bed side commode for patient to use since she could not walk.  Reports she is not taking any thing for pain.   Reports she and husband changes dressings this week because of no nurse.  Reports wounds are healing.  Report legs are extremely painful.   Placed call to [facility] and spoke with supervisor "T". Who reports last INR on 03/20/2020 and last nurse visit on 03/26/2020.  Reports patient is on schedule for tomorrow. I informed supervisor of change in condition as patient is not able to walk. Supervisor to Marketing executive and inform so nurse can assess problem with ambulation.    PLAN: contacted Home health, in basket message sent to MD about change in condition.   Will plan to contact again next week.  Tomasa Rand, RN, BSN, CEN [facility] [employer] [phone]

## 2020-04-04 ENCOUNTER — Ambulatory Visit (INDEPENDENT_AMBULATORY_CARE_PROVIDER_SITE_OTHER): Payer: Medicare Other | Admitting: Pharmacist

## 2020-04-04 DIAGNOSIS — I480 Paroxysmal atrial fibrillation: Secondary | ICD-10-CM | POA: Diagnosis not present

## 2020-04-04 DIAGNOSIS — Z5181 Encounter for therapeutic drug level monitoring: Secondary | ICD-10-CM | POA: Diagnosis not present

## 2020-04-04 DIAGNOSIS — I4819 Other persistent atrial fibrillation: Secondary | ICD-10-CM | POA: Diagnosis not present

## 2020-04-04 DIAGNOSIS — I152 Hypertension secondary to endocrine disorders: Secondary | ICD-10-CM | POA: Diagnosis not present

## 2020-04-04 DIAGNOSIS — E1122 Type 2 diabetes mellitus with diabetic chronic kidney disease: Secondary | ICD-10-CM | POA: Diagnosis not present

## 2020-04-04 DIAGNOSIS — I5033 Acute on chronic diastolic (congestive) heart failure: Secondary | ICD-10-CM | POA: Diagnosis not present

## 2020-04-04 DIAGNOSIS — C3411 Malignant neoplasm of upper lobe, right bronchus or lung: Secondary | ICD-10-CM | POA: Diagnosis not present

## 2020-04-04 DIAGNOSIS — I2510 Atherosclerotic heart disease of native coronary artery without angina pectoris: Secondary | ICD-10-CM | POA: Diagnosis not present

## 2020-04-04 LAB — POCT INR: INR: 1.2 — AB (ref 2.0–3.0)

## 2020-04-04 NOTE — Patient Instructions (Signed)
Description   Spoke with [name] at [employer]. Pt not doing well, has multiple leg wounds and is taking husbands pain medication.  [facility] nurse unsure if she is remembering to take warfarin.  Instructed to take 5 mg today and tomorrow then continue taking 1/2 tablet daily except 1 tablet Thurdays. Repeat INR in 1 week.  Orders given to [facility] with [facility]

## 2020-04-04 NOTE — Telephone Encounter (Signed)
Darlina Guys with [facility] is calling to report INR results. Please return call to discuss at [phone].

## 2020-04-09 DIAGNOSIS — Z01812 Encounter for preprocedural laboratory examination: Secondary | ICD-10-CM | POA: Diagnosis not present

## 2020-04-10 ENCOUNTER — Other Ambulatory Visit: Payer: Self-pay

## 2020-04-10 ENCOUNTER — Encounter (HOSPITAL_BASED_OUTPATIENT_CLINIC_OR_DEPARTMENT_OTHER): Payer: Medicare Other | Admitting: Internal Medicine

## 2020-04-10 ENCOUNTER — Encounter: Payer: Self-pay | Admitting: Vascular Surgery

## 2020-04-10 ENCOUNTER — Ambulatory Visit (INDEPENDENT_AMBULATORY_CARE_PROVIDER_SITE_OTHER): Payer: Medicare Other | Admitting: Vascular Surgery

## 2020-04-10 VITALS — BP 132/70 | HR 96 | Temp 98.0°F | Resp 20 | Ht 60.0 in | Wt 160.0 lb

## 2020-04-10 DIAGNOSIS — I739 Peripheral vascular disease, unspecified: Secondary | ICD-10-CM

## 2020-04-10 DIAGNOSIS — I2510 Atherosclerotic heart disease of native coronary artery without angina pectoris: Secondary | ICD-10-CM

## 2020-04-10 MED ORDER — CEPHALEXIN 500 MG PO CAPS: 500.0000 mg | ORAL_CAPSULE | Freq: Two times a day (BID) | ORAL | 0 refills | Status: DC

## 2020-04-10 NOTE — Progress Notes (Signed)
Patient is a 74 year old female who returns for follow-up today.  She was last seen on March 13, 2019 along with nonhealing wounds.  She also has a history of marginal renal function.  She also has a history of poorly controlled diabetes.  She was given a second prescription for antibiotics which she failed to pick up at the pharmacy.  This was after she had previously failed to pick up a prescription for antibiotics from the emergency room as well.  She still has erythema around the wound but she states the wound is healing.  She is followed at the wound center.  She is not really ambulatory secondary to pain and swelling in her legs.  She is still receiving compressive type dressings.  Patient has chronic atrial fibrillation and is on warfarin.  She is also on chronic home oxygen.  She reports no fever or chills.  Past Medical History:  Diagnosis Date  . Acute CHF (congestive heart failure) ([place]) 12/2018  . Adenocarcinoma of lung, stage 1, right ([place]) 11/25/2017  . Anemia, iron deficiency    of chronic disease  . Aortic stenosis    a. Severe AS by echo 11/2012.  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 Asystole ([place])    a. During ENT surgery 2005: developed marked asystole requiring CPR, felt due to vagal reaction (cath nonobst dz).  . Carotid artery disease ([place])    a. Carotid Dopplers performed in August 2013 showed 40-59% left stenosis and 0-39% right; f/u recommended in 2 years.   . Cerebrovascular accident [facility]) 2009   a. LMCA infarct felt embolic 4196, maintained on chronic coumadin.; denies residual on 04/05/2013  . Cholelithiasis   . Chronic Persistent Atrial Fibrillation 12/31/2008   Qualifier: Diagnosis of  By: [name]    . Coronary artery disease 05/2002   a. Ant MI 2003 s/p PTCA/stent to RCA.   . Diverticulosis of colon   . Esophagitis, reflux   . ESRD (end stage renal disease) ([place])    a. Mass on L kidney per pt s/p nephrectomy - pt states not cancer -  [facility] notes indicate ESRD due to HTN/DM - was previously on HD. b. Kidney transplant 02/2011.  Marland Kitchen GERD (gastroesophageal reflux disease)   . Gout   . Hearing loss   . Helicobacter pylori (H. pylori) infection    hx of  . Hemorrhoids   . Hx of colonic polyps    adenomatous  . Hyperlipidemia   . Hypertension   . Lung nodule seen on imaging study 04/07/2013   1.0 cm ground glass opacity RUL  . Myocardial infarction ([place]) 2003  . Pericardial effusion    a. Small by echo 11/2011.  . S/P aortic valve replacement with bioprosthetic valve and maze procedure 04/12/2013   70mm [facility] Ease bovine pericardial tissue valve   . S/P Maze operation for atrial fibrillation 04/12/2013   Complete bilateral atrial lesion set using bipolar radiofrequency and cryothermy ablation with clipping of LA appendage  . Sleep apnea    Pt says testing was positive, intolerant of CPAP.  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  . Stroke ([place])   . Type II diabetes mellitus ([place])     Past Surgical History:  Procedure Laterality Date  . AORTIC VALVE REPLACEMENT N/A 04/12/2013   Procedure: AORTIC VALVE REPLACEMENT (  AVR);  Surgeon: Rexene Alberts, MD;  Location: [place];  Service: Open Heart Surgery;  Laterality: N/A;  . ARTERIOVENOUS GRAFT PLACEMENT Left   . ARTERIOVENOUS GRAFT PLACEMENT Left    "I've had 2 on my left; had one removed" (04/05/2013)   . ARTERY EXPLORATION Right 04/11/2013   Procedure: ARTERY EXPLORATION;  Surgeon: Rexene Alberts, MD;  Location: [place];  Service: Open Heart Surgery;  Laterality: Right;  Right carotid artery exploration  . AV FISTULA PLACEMENT Right   . AV FISTULA REPAIR Right    "took it out" ((/18/2014)  . CARDIOVERSION  05/29/2012   Procedure: CARDIOVERSION;  Surgeon: Lelon Perla, MD;  Location[facility] ENDOSCOPY;  Service:  Cardiovascular;  Laterality: N/A;  . CHOLECYSTECTOMY  2009   with hernia removal  . CORONARY ANGIOPLASTY WITH STENT PLACEMENT Right    coronary artery  . INSERTION OF DIALYSIS CATHETER Bilateral    "over the years; took them both out" (04/05/2013)  . INTRAOPERATIVE TRANSESOPHAGEAL ECHOCARDIOGRAM N/A 04/11/2013   Procedure: INTRAOPERATIVE TRANSESOPHAGEAL ECHOCARDIOGRAM;  Surgeon: Rexene Alberts, MD;  Location: [place];  Service: Open Heart Surgery;  Laterality: N/A;  . INTRAOPERATIVE TRANSESOPHAGEAL ECHOCARDIOGRAM N/A 04/12/2013   Procedure: INTRAOPERATIVE TRANSESOPHAGEAL ECHOCARDIOGRAM;  Surgeon: Rexene Alberts, MD;  Location: [place];  Service: Open Heart Surgery;  Laterality: N/A;  . KIDNEY TRANSPLANT  03/16/11  . LEFT AND RIGHT HEART CATHETERIZATION WITH CORONARY ANGIOGRAM N/A 04/06/2013   Procedure: LEFT AND RIGHT HEART CATHETERIZATION WITH CORONARY ANGIOGRAM;  Surgeon: Blane Ohara, MD;  Location: [facility] CATH LAB;  Service: Cardiovascular;  Laterality: N/A;  . MAZE N/A 04/12/2013   Procedure: MAZE;  Surgeon: Rexene Alberts, MD;  Location: [place];  Service: Open Heart Surgery;  Laterality: N/A;  . NASAL RECONSTRUCTION WITH SEPTAL REPAIR     "took it out" (04/05/2013)  . NEPHRECTOMY Left 2010   no CA on bx  . TONSILLECTOMY    . TOTAL ABDOMINAL HYSTERECTOMY    . TUBAL LIGATION      Current Outpatient Medications on File Prior to Visit  Medication Sig Dispense Refill  . acetaminophen (TYLENOL) 500 MG tablet Take 500 mg by mouth every 6 (six) hours as needed for mild pain or headache.    Marland Kitchen amoxicillin (AMOXIL) 500 MG capsule TAKE 4 CAPSULES BY MOUTH 1 HOUR PRIOR TO DENTAL WORK (Patient not taking: Reported on 03/12/2020) 4 capsule 1  . clobetasol ointment (TEMOVATE) 3.76 % Apply 1 application topically 2 (two) times daily. 60 g 1  . dapsone 25 MG tablet Take 25 mg by mouth daily.     Marland Kitchen gabapentin (NEURONTIN) 300 MG capsule Take 1 capsule (300 mg total) by mouth 3 (three) times daily. 90 capsule  5  . hydrALAZINE (APRESOLINE) 25 MG tablet Take 25 mg by mouth 3 (three) times daily. (Patient not taking: Reported on 03/12/2020)    . insulin glargine, 1 Unit Dial, (TOUJEO SOLOSTAR) 300 UNIT/ML Solostar Pen Inject 60 Units into the skin every morning. Titrate up by 1 unit a day if needed for goal sugars of 100-130 up to 50 units a day max 3 pen 3  . insulin lispro (HUMALOG KWIKPEN) 200 UNIT/ML KwikPen Inject 10 Units into the skin with breakfast, with lunch, and with evening meal. 9 mL 1  . Insulin Pen Needle (B-D UF III MINI PEN NEEDLES) 31G X 5 MM MISC USE TO ADMINISTER INSULIN FOUR TIMES A DAY DX E11.9 100 each 1  . levothyroxine (SYNTHROID) 25 MCG  tablet TAKE 2 TABLETS (50 MCG TOTAL) BY MOUTH DAILY. 180 tablet 3  . lidocaine (XYLOCAINE) 5 % ointment Apply 1 application topically 4 (four) times daily as needed. 50 g 1  . metoprolol tartrate (LOPRESSOR) 25 MG tablet Take 0.5 tablets (12.5 mg total) by mouth 2 (two) times daily. 90 tablet 3  . ONETOUCH DELICA LANCETS 72Z MISC Use to check blood sugars three times a day DX E11.9 100 each 5  . ONETOUCH VERIO test strip USE TO TEST 3 TIMES DAILY. DX E11.9 100 strip 5  . oxyCODONE-acetaminophen (PERCOCET/ROXICET) 5-325 MG tablet Take 1 tablet by mouth every 8 (eight) hours as needed for severe pain. 60 tablet 0  . OXYGEN Inhale 3 L into the lungs as needed (for shortness of breath).     . potassium chloride SA (KLOR-CON M20) 20 MEQ tablet Take 1 tablet (20 mEq total) by mouth daily. (Patient taking differently: Take 40 mEq by mouth daily. ) 30 tablet 3  . predniSONE (DELTASONE) 5 MG tablet Take 1 tablet by mouth daily.    Marland Kitchen Propylene Glycol (SYSTANE BALANCE) 0.6 % SOLN Place 1-2 drops into both eyes 3 (three) times daily as needed (for dryness).    . repaglinide (PRANDIN) 0.5 MG tablet Take 0.5 mg by mouth 3 (three) times daily before meals.    . rosuvastatin (CRESTOR) 20 MG tablet Take 1 tablet (20 mg total) by mouth daily. (Patient taking  differently: Take 20 mg by mouth every other day. ) 90 tablet 3  . tacrolimus (PROGRAF) 1 MG capsule Take 3 mg by mouth 2 (two) times daily.     Marland Kitchen torsemide (DEMADEX) 100 MG tablet Take 2 tablets (200 mg total) by mouth daily. 60 tablet 5  . triamcinolone cream (KENALOG) 0.1 % Apply 1 application topically 4 (four) times daily. 80 g 1  . warfarin (COUMADIN) 5 MG tablet TAKE 1/2 TO 1 TABLET BY MOUTH DAILY AS DIRECTED BY COUMADIN CLINIC 90 tablet 1   No current facility-administered medications on file prior to visit.    Social History   Socioeconomic History  . Marital status: Married    Spouse name: Not on file  . Number of children: 5  . Years of education: 22  . Highest education level: Not on file  Occupational History  . Occupation: retired    Fish farm manager: RETIRED  Tobacco Use  . Smoking status: Former Smoker    Packs/day: 1.00    Years: 30.00    Pack years: 30.00    Types: Cigarettes    Quit date: 07/19/2001    Years since quitting: 18.7  . Smokeless tobacco: Never Used  Vaping Use  . Vaping Use: Never used  Substance and Sexual Activity  . Alcohol use: No  . Drug use: No  . Sexual activity: Not Currently  Other Topics Concern  . Not on file  Social History Narrative   Patient signed a Designated Party Release to allow her spouse [name] and family and five children to have access to her medical records/information.     Patient lives with husband in a 2 story home.  Has 5 children. Retired from Personal assistant.  Education: 3 years of college.    Social Determinants of Health   Financial Resource Strain:   . Difficulty of Paying Living Expenses: Not on file  Food Insecurity: No Food Insecurity  . Worried About Charity fundraiser in the Last Year: Never true  . Ran Out of Food in the  Last Year: Never true  Transportation Needs: No Transportation Needs  . Lack of Transportation (Medical): No  . Lack of Transportation (Non-Medical): No  Physical Activity: Inactive  .  Days of Exercise per Week: 0 days  . Minutes of Exercise per Session: 0 min  Stress: Stress Concern Present  . Feeling of Stress : Rather much  Social Connections:   . Frequency of Communication with Friends and Family: Not on file  . Frequency of Social Gatherings with Friends and Family: Not on file  . Attends Religious Services: Not on file  . Active Member of Clubs or Organizations: Not on file  . Attends Archivist Meetings: Not on file  . Marital Status: Not on file  Intimate Partner Violence: Not At Risk  . Fear of Current or Ex-Partner: No  . Emotionally Abused: No  . Physically Abused: No  . Sexually Abused: No    Physical exam:  Vitals:   04/10/20 1410  BP: 132/70  Pulse: 96  Resp: 20  Temp: 98 F (36.7 C)  SpO2: 93%  Weight: 160 lb (72.6 kg)  Height: 5' (1.524 m)    Extremities: No palpable pedal pulses 1+ pretibial and pedal edema bilaterally  Bilateral calf ulcers as depicted below erythema surrounding the wound on the left leg      Data: Patient had recent serum creatinine checked on April 10, 2020 which showed a serum creatinine of 2.6.  Assessment: Slowly healing wounds bilateral lower extremities.  Patient has multifactorial reasons for these with lower extremity edema peripheral arterial disease marginal diabetes controlled and renal dysfunction.  She still has erythema in the left leg and was given a third prescription for Keflex today.  I discussed with her family member that if she goes to the pharmacy and they do not have the prescription available to please call our office.  Plan: Patient will follow up in 6 weeks to recheck the wounds on her left leg.  She has severe renal dysfunction and an arteriogram is probably going to put her at high risk for going on the dialysis and she is trying to avoid this.  She would not be a candidate for open operation but may be a candidate for percutaneous revascularization.  If she proceeds to  dialysis then certainly we would do the arteriogram in the near future.  She will can continue to follow-up at the wound center for her nonhealing wounds.  Ruta Hinds, MD [facility] Office: [phone]

## 2020-04-10 NOTE — Patient Outreach (Addendum)
[facility]) Care Management  04/10/2020  [name] [birth year] [id number]   Telephone assessment:  Placed call to patient for follow up from last week. Patient reports home health came to see her and did her dressing changes and checked her INR.  Reports today she is ok. Continues to have leg pain. Has been using lidocaine for pain. Reports she has an appointment with vascular today to assess her leg pain. Reports home heath nurse will come tomorrow.  CBG today of 109. Has not weighed due to inability to get to scale. Reports she will bring scale downstairs and see what her weight is. Reports he legs are skinny.  PLAN: follow up in 1 week.  Tomasa Rand, RN, BSN, CEN [facility] [employer] [phone]

## 2020-04-12 DIAGNOSIS — I4819 Other persistent atrial fibrillation: Secondary | ICD-10-CM | POA: Diagnosis not present

## 2020-04-12 DIAGNOSIS — I5033 Acute on chronic diastolic (congestive) heart failure: Secondary | ICD-10-CM | POA: Diagnosis not present

## 2020-04-12 DIAGNOSIS — E1122 Type 2 diabetes mellitus with diabetic chronic kidney disease: Secondary | ICD-10-CM | POA: Diagnosis not present

## 2020-04-12 DIAGNOSIS — I152 Hypertension secondary to endocrine disorders: Secondary | ICD-10-CM | POA: Diagnosis not present

## 2020-04-12 DIAGNOSIS — C3411 Malignant neoplasm of upper lobe, right bronchus or lung: Secondary | ICD-10-CM | POA: Diagnosis not present

## 2020-04-12 DIAGNOSIS — I2510 Atherosclerotic heart disease of native coronary artery without angina pectoris: Secondary | ICD-10-CM | POA: Diagnosis not present

## 2020-04-14 ENCOUNTER — Encounter (HOSPITAL_BASED_OUTPATIENT_CLINIC_OR_DEPARTMENT_OTHER): Payer: Medicare Other | Attending: Internal Medicine | Admitting: Internal Medicine

## 2020-04-14 ENCOUNTER — Other Ambulatory Visit (HOSPITAL_COMMUNITY)
Admission: RE | Admit: 2020-04-14 | Discharge: 2020-04-14 | Disposition: A | Discharge status: Home / Self Care | Payer: Medicare Other | Source: Other Acute Inpatient Hospital | Attending: Internal Medicine | Admitting: Internal Medicine

## 2020-04-14 DIAGNOSIS — I130 Hypertensive heart and chronic kidney disease with heart failure and stage 1 through stage 4 chronic kidney disease, or unspecified chronic kidney disease: Secondary | ICD-10-CM | POA: Diagnosis not present

## 2020-04-14 DIAGNOSIS — I350 Nonrheumatic aortic (valve) stenosis: Secondary | ICD-10-CM | POA: Diagnosis not present

## 2020-04-14 DIAGNOSIS — Z923 Personal history of irradiation: Secondary | ICD-10-CM | POA: Insufficient documentation

## 2020-04-14 DIAGNOSIS — L97221 Non-pressure chronic ulcer of left calf limited to breakdown of skin: Secondary | ICD-10-CM | POA: Insufficient documentation

## 2020-04-14 DIAGNOSIS — E11622 Type 2 diabetes mellitus with other skin ulcer: Secondary | ICD-10-CM | POA: Insufficient documentation

## 2020-04-14 DIAGNOSIS — Z888 Allergy status to other drugs, medicaments and biological substances status: Secondary | ICD-10-CM | POA: Diagnosis not present

## 2020-04-14 DIAGNOSIS — N184 Chronic kidney disease, stage 4 (severe): Secondary | ICD-10-CM | POA: Insufficient documentation

## 2020-04-14 DIAGNOSIS — E1122 Type 2 diabetes mellitus with diabetic chronic kidney disease: Secondary | ICD-10-CM | POA: Diagnosis not present

## 2020-04-14 DIAGNOSIS — Z882 Allergy status to sulfonamides status: Secondary | ICD-10-CM | POA: Diagnosis not present

## 2020-04-14 DIAGNOSIS — J449 Chronic obstructive pulmonary disease, unspecified: Secondary | ICD-10-CM | POA: Insufficient documentation

## 2020-04-14 DIAGNOSIS — L97822 Non-pressure chronic ulcer of other part of left lower leg with fat layer exposed: Secondary | ICD-10-CM | POA: Diagnosis not present

## 2020-04-14 DIAGNOSIS — Z940 Kidney transplant status: Secondary | ICD-10-CM | POA: Diagnosis not present

## 2020-04-14 DIAGNOSIS — I5032 Chronic diastolic (congestive) heart failure: Secondary | ICD-10-CM | POA: Insufficient documentation

## 2020-04-14 DIAGNOSIS — Z7901 Long term (current) use of anticoagulants: Secondary | ICD-10-CM | POA: Diagnosis not present

## 2020-04-14 DIAGNOSIS — L97211 Non-pressure chronic ulcer of right calf limited to breakdown of skin: Secondary | ICD-10-CM | POA: Insufficient documentation

## 2020-04-14 DIAGNOSIS — G4733 Obstructive sleep apnea (adult) (pediatric): Secondary | ICD-10-CM | POA: Insufficient documentation

## 2020-04-14 DIAGNOSIS — Z794 Long term (current) use of insulin: Secondary | ICD-10-CM | POA: Insufficient documentation

## 2020-04-14 DIAGNOSIS — Z881 Allergy status to other antibiotic agents status: Secondary | ICD-10-CM | POA: Insufficient documentation

## 2020-04-14 DIAGNOSIS — I482 Chronic atrial fibrillation, unspecified: Secondary | ICD-10-CM | POA: Insufficient documentation

## 2020-04-14 DIAGNOSIS — Z885 Allergy status to narcotic agent status: Secondary | ICD-10-CM | POA: Insufficient documentation

## 2020-04-14 DIAGNOSIS — Z886 Allergy status to analgesic agent status: Secondary | ICD-10-CM | POA: Diagnosis not present

## 2020-04-14 DIAGNOSIS — Z85118 Personal history of other malignant neoplasm of bronchus and lung: Secondary | ICD-10-CM | POA: Insufficient documentation

## 2020-04-14 NOTE — Progress Notes (Signed)
[name], [name] ([id number]) Visit Report for 04/14/2020 Arrival Information Details Patient Name: Date of Service: [name] Mode, DO RIS 04/14/2020 2:00 PM Medical Record Number: [id number] Patient Account Number: [id number] Date of Birth/Sex: Treating RN: 1946-05-27 (74 y.o. [name] Primary Care [name]: [name] Other Clinician: Referring Andree Golphin: Treating Lanina Larranaga/Extender: [name] in Treatment: 6 Visit Information History Since Last Visit Added or deleted any medications: No Patient Arrived: Wheel Chair Any new allergies or adverse reactions: No Arrival Time: 14:14 Had a fall or experienced change in No Accompanied By: grandson activities of daily living that may affect Transfer Assistance: None risk of falls: Patient Identification Verified: Yes Signs or symptoms of abuse/neglect since last visito No Secondary Verification Process Completed: Yes Hospitalized since last visit: No Patient Has Alerts: Yes Implantable device outside of the clinic excluding No Patient Alerts: Patient on Blood Thinner cellular tissue based products placed in the center unable to obtain ABI on R since last visit: Has Dressing in Place as Prescribed: Yes Pain Present Now: Yes Electronic Signature(s) Signed: 04/14/2020 5:24:24 PM By: Kela Millin Entered By: Kela Millin on 04/14/2020 14:21:09 -------------------------------------------------------------------------------- Encounter Discharge Information Details Patient Name: Date of Service: [name], DO RIS 04/14/2020 2:00 PM Medical Record Number: [id number] Patient Account Number: [id number] Date of Birth/Sex: Treating RN: [birth year] (74 y.o. [name] Primary Care [name]: [name] Other Clinician: Referring Joell Usman: Treating Warner Laduca/Extender: [name] in Treatment: 6 Encounter Discharge Information Items Post Procedure  Vitals Discharge Condition: Stable Temperature (F): 98.7 Ambulatory Status: Wheelchair Pulse (bpm): 71 Discharge Destination: Home Respiratory Rate (breaths/min): 19 Transportation: Private Auto Blood Pressure (mmHg): 152/76 Accompanied By: family member Schedule Follow-up Appointment: Yes Clinical Summary of Care: Electronic Signature(s) Signed: 04/14/2020 5:26:25 PM By: [name] Entered By: [name] on 04/14/2020 15:55:02 -------------------------------------------------------------------------------- Lower Extremity Assessment Details Patient Name: Date of Service: [name] Mode, DO RIS 04/14/2020 2:00 PM Medical Record Number: [id number] Patient Account Number: [id number] Date of Birth/Sex: Treating RN: [birth year] (75 y.o. [name] Primary Care [name]: [name] Other Clinician: Referring Nupur Hohman: Treating Shaunee Mulkern/Extender: [name] in Treatment: 6 Edema Assessment Assessed: [Left: No] [Right: No] E[Left: dema] [Right: :] Calf Left: Right: Point of Measurement: 38 cm From Medial Instep 32.5 cm 34.5 cm Ankle Left: Right: Point of Measurement: 9 cm From Medial Instep 21 cm 22 cm Vascular Assessment Pulses: Dorsalis Pedis Palpable: [Left:No] [Right:No] Electronic Signature(s) Signed: 04/14/2020 5:24:24 PM By: Kela Millin Entered By: Kela Millin on 04/14/2020 14:24:41 -------------------------------------------------------------------------------- Multi-Disciplinary Care Plan Details Patient Name: Date of Service: [name] Mode, DO RIS 04/14/2020 2:00 PM Medical Record Number: [id number] Patient Account Number: [id number] Date of Birth/Sex: Treating RN: 12/04/1945 (74 y.o. [name] Primary Care Dalaney Needle: [name] Other Clinician: Referring Breindy Meadow: Treating Yailin Biederman/Extender: [name] in Treatment: 6 Active Inactive Wound/Skin  Impairment Nursing Diagnoses: Impaired tissue integrity Knowledge deficit related to ulceration/compromised skin integrity Goals: Patient/caregiver will verbalize understanding of skin care regimen Date Initiated: 04/14/2020 Target Resolution Date: 05/02/2020 Goal Status: Active Interventions: Assess patient/caregiver ability to obtain necessary supplies Assess patient/caregiver ability to perform ulcer/skin care regimen upon admission and as needed Assess ulceration(s) every visit Provide education on ulcer and skin care Notes: Electronic Signature(s) Signed: 04/14/2020 5:31:38 PM By: Levan Hurst RN, BSN Entered By: Levan Hurst on 04/14/2020 17:23:54 -------------------------------------------------------------------------------- Pain Assessment Details Patient Name: Date of Service: [name], DO RIS  04/14/2020 2:00 PM Medical Record Number: [id number] Patient Account Number: [id number] Date of Birth/Sex: Treating RN: August 29, 1945 (74 y.o. [name] Primary Care [name]: [name] Other Clinician: Referring Talina Pleitez: Treating Jordain Radin/Extender: [name] in Treatment: 6 Active Problems Location of Pain Severity and Description of Pain Patient Has Paino Yes Site Locations Pain Location: Pain in Ulcers With Dressing Change: Yes Duration of the Pain. Constant / Intermittento Constant Rate the pain. Current Pain Level: 9 Worst Pain Level: 10 Least Pain Level: 6 Tolerable Pain Level: 6 Character of Pain Describe the Pain: Aching, Burning, Stabbing Pain Management and Medication Current Pain Management: Electronic Signature(s) Signed: 04/14/2020 5:24:24 PM By: Kela Millin Entered By: Kela Millin on 04/14/2020 14:23:41 -------------------------------------------------------------------------------- Patient/Caregiver Education Details Patient Name: Date of Service: [name], DO RIS  9/27/2021andnbsp2:00 PM Medical Record Number: [id number] Patient Account Number: [id number] Date of Birth/Gender: Treating RN: March 18, 1946 (74 y.o. [name] Primary Care Physician: [name] Other Clinician: Referring Physician: Treating Physician/Extender: [name] in Treatment: 6 Education Assessment Education Provided To: Patient Education Topics Provided Wound/Skin Impairment: Methods: Explain/Verbal Responses: State content correctly Electronic Signature(s) Signed: 04/14/2020 5:31:38 PM By: Levan Hurst RN, BSN Entered By: Levan Hurst on 04/14/2020 17:24:10 -------------------------------------------------------------------------------- Wound Assessment Details Patient Name: Date of Service: [name], DO RIS 04/14/2020 2:00 PM Medical Record Number: [id number] Patient Account Number: [id number] Date of Birth/Sex: Treating RN: 12-08-1945 (74 y.o. [name] Primary Care Rabab Currington: [name] Other Clinician: Referring Ethleen Lormand: Treating Demontrez Rindfleisch/Extender: [name] in Treatment: 6 Wound Status Wound Number: 1 Primary Diabetic Wound/Ulcer of the Lower Extremity Etiology: Wound Location: Right, Posterior Lower Leg Wound Open Wounding Event: Gradually Appeared Status: Date Acquired: 01/17/2020 Comorbid Chronic Obstructive Pulmonary Disease (COPD), Hypertension, Weeks Of Treatment: 6 History: Myocardial Infarction, Type II Diabetes Clustered Wound: No Wound Measurements Length: (cm) 0.9 Width: (cm) 0.9 Depth: (cm) 0.1 Area: (cm) 0.636 Volume: (cm) 0.064 % Reduction in Area: -224.5% % Reduction in Volume: -64.1% Epithelialization: None Tunneling: No Undermining: No Wound Description Classification: Grade 2 Wound Margin: Distinct, outline attached Exudate Amount: Small Exudate Type: Serous Exudate Color: amber Foul Odor After Cleansing:  No Slough/Fibrino Yes Wound Bed Granulation Amount: None Present (0%) Exposed Structure Necrotic Amount: Large (67-100%) Fascia Exposed: No Necrotic Quality: Eschar, Adherent Slough Fat Layer (Subcutaneous Tissue) Exposed: No Tendon Exposed: No Muscle Exposed: No Joint Exposed: No Bone Exposed: No Treatment Notes Wound #1 (Right, Posterior Lower Leg) 1. Cleanse With Wound Cleanser Soap and water 2. Periwound Care Moisturizing lotion 3. Primary Dressing Applied Iodoflex 4. Secondary Dressing ABD Pad Dry Gauze Kerramax/Xtrasorb 6. Support Layer Applied Kerlix/Coban Notes compression applied lightly. Electronic Signature(s) Signed: 04/14/2020 5:24:24 PM By: Kela Millin Entered By: Kela Millin on 04/14/2020 14:28:11 -------------------------------------------------------------------------------- Wound Assessment Details Patient Name: Date of Service: [name] Mode, DO RIS 04/14/2020 2:00 PM Medical Record Number: [id number] Patient Account Number: [id number] Date of Birth/Sex: Treating RN: 16-Jul-1946 (74 y.o. [name] Primary Care Jacques Willingham: [name] Other Clinician: Referring Tamsyn Owusu: Treating Markise Haymer/Extender: [name] in Treatment: 6 Wound Status Wound Number: 2 Primary Diabetic Wound/Ulcer of the Lower Extremity Etiology: Wound Location: Left, Anterior Lower Leg Wound Open Wounding Event: Gradually Appeared Status: Date Acquired: 01/17/2020 Comorbid Chronic Obstructive Pulmonary Disease (COPD), Hypertension, Weeks Of Treatment: 6 History: Myocardial Infarction, Type II Diabetes Clustered Wound: No Wound Measurements Length: (cm) 2 Width: (cm) 2 Depth: (cm) 0.2  Area: (cm) 3.142 Volume: (cm) 0.628 % Reduction in Area: -263.7% % Reduction in Volume: -630.2% Epithelialization: None Tunneling: No Undermining: No Wound Description Classification: Grade 2 Wound Margin: Distinct, outline  attached Exudate Amount: Small Exudate Type: Serous Exudate Color: amber Foul Odor After Cleansing: No Slough/Fibrino Yes Wound Bed Granulation Amount: None Present (0%) Exposed Structure Necrotic Amount: Large (67-100%) Fascia Exposed: No Necrotic Quality: Eschar, Adherent Slough Fat Layer (Subcutaneous Tissue) Exposed: Yes Tendon Exposed: No Muscle Exposed: No Joint Exposed: No Bone Exposed: No Treatment Notes Wound #2 (Left, Anterior Lower Leg) 1. Cleanse With Wound Cleanser Soap and water 2. Periwound Care Moisturizing lotion 3. Primary Dressing Applied Iodoflex 4. Secondary Dressing ABD Pad Dry Gauze Kerramax/Xtrasorb 6. Support Layer Applied Kerlix/Coban Notes compression applied lightly. Electronic Signature(s) Signed: 04/14/2020 5:24:24 PM By: Kela Millin Entered By: Kela Millin on 04/14/2020 14:29:08 -------------------------------------------------------------------------------- Wound Assessment Details Patient Name: Date of Service: [name] Mode, DO RIS 04/14/2020 2:00 PM Medical Record Number: [id number] Patient Account Number: [id number] Date of Birth/Sex: Treating RN: 1946/05/16 (74 y.o. [name] Primary Care Rutha Melgoza: [name] Other Clinician: Referring Nirali Magouirk: Treating Rashana Andrew/Extender: [name] in Treatment: 6 Wound Status Wound Number: 3 Primary Diabetic Wound/Ulcer of the Lower Extremity Etiology: Wound Location: Left, Proximal, Anterior Lower Leg Wound Open Wounding Event: Gradually Appeared Status: Date Acquired: 04/07/2020 Comorbid Chronic Obstructive Pulmonary Disease (COPD), Hypertension, Weeks Of Treatment: 0 History: Myocardial Infarction, Type II Diabetes Clustered Wound: Yes Wound Measurements Length: (cm) Width: (cm) Depth: (cm) Clustered Quantity: Area: (cm) Volume: (cm) 4 % Reduction in Area: 2 % Reduction in Volume: 0.1 Epithelialization: None 3  Tunneling: No 6.283 Undermining: No 0.628 Wound Description Classification: Grade 2 Wound Margin: Distinct, outline attached Exudate Amount: Small Exudate Type: Purulent Exudate Color: yellow, brown, green Foul Odor After Cleansing: No Slough/Fibrino Yes Wound Bed Granulation Amount: None Present (0%) Exposed Structure Necrotic Amount: Large (67-100%) Fascia Exposed: No Necrotic Quality: Eschar, Adherent Slough Fat Layer (Subcutaneous Tissue) Exposed: No Tendon Exposed: No Muscle Exposed: No Joint Exposed: No Bone Exposed: No Treatment Notes Wound #3 (Left, Proximal, Anterior Lower Leg) 1. Cleanse With Wound Cleanser Soap and water 2. Periwound Care Moisturizing lotion 3. Primary Dressing Applied Calcium Alginate Ag 4. Secondary Dressing ABD Pad Dry Gauze Kerramax/Xtrasorb 6. Support Layer Holiday representative) Signed: 04/14/2020 5:24:24 PM By: Kela Millin Entered By: Kela Millin on 04/14/2020 14:30:30 -------------------------------------------------------------------------------- Wound Assessment Details Patient Name: Date of Service: [name] Mode, DO RIS 04/14/2020 2:00 PM Medical Record Number: [id number] Patient Account Number: [id number] Date of Birth/Sex: Treating RN: 1946-06-10 (74 y.o. [name] Primary Care Jamine Highfill: [name] Other Clinician: Referring Lennan Malone: Treating Avanish Cerullo/Extender: [name] in Treatment: 6 Wound Status Wound Number: 4 Primary Diabetic Wound/Ulcer of the Lower Extremity Etiology: Wound Location: Left, Posterior Lower Leg Wound Open Wounding Event: Gradually Appeared Status: Date Acquired: 04/07/2020 Comorbid Chronic Obstructive Pulmonary Disease (COPD), Hypertension, Weeks Of Treatment: 0 History: Myocardial Infarction, Type II Diabetes Clustered Wound: No Wound Measurements Length: (cm) 0.5 Width: (cm) 0.5 Depth: (cm) 0.1 Area:  (cm) 0.196 Volume: (cm) 0.02 % Reduction in Area: % Reduction in Volume: Epithelialization: None Tunneling: No Undermining: No Wound Description Classification: Grade 2 Wound Margin: Distinct, outline attached Exudate Amount: Small Exudate Type: Serous Exudate Color: amber Foul Odor After Cleansing: No Slough/Fibrino Yes Wound Bed Granulation Amount: Small (1-33%) Exposed Structure Granulation Quality: Pink Fascia Exposed: No Necrotic Amount: Large (67-100%) Fat Layer (Subcutaneous Tissue)  Exposed: Yes Necrotic Quality: Adherent Slough Tendon Exposed: No Muscle Exposed: No Joint Exposed: No Bone Exposed: No Treatment Notes Wound #4 (Left, Posterior Lower Leg) 1. Cleanse With Wound Cleanser Soap and water 2. Periwound Care Moisturizing lotion 3. Primary Dressing Applied Iodoflex 4. Secondary Dressing ABD Pad Dry Gauze Kerramax/Xtrasorb 6. Support Layer Applied Kerlix/Coban Notes compression applied lightly. Electronic Signature(s) Signed: 04/14/2020 5:24:24 PM By: Kela Millin Entered By: Kela Millin on 04/14/2020 14:31:45 -------------------------------------------------------------------------------- Vitals Details Patient Name: Date of Service: [name], DO RIS 04/14/2020 2:00 PM Medical Record Number: [id number] Patient Account Number: [id number] Date of Birth/Sex: Treating RN: [birth year] (74 y.o. [name] Primary Care Solaris Kram: [name] Other Clinician: Referring Geonna Lockyer: Treating Cayne Yom/Extender: [name] in Treatment: 6 Vital Signs Time Taken: 14:22 Temperature (F): 98.7 Height (in): 60 Pulse (bpm): 71 Weight (lbs): 160 Respiratory Rate (breaths/min): 19 Body Mass Index (BMI): 31.2 Blood Pressure (mmHg): 152/76 Capillary Blood Glucose (mg/dl): 143 Reference Range: 80 - 120 mg / dl Notes patient stated CBG this morning was 143 Electronic Signature(s) Signed: 04/14/2020  5:24:24 PM By: Kela Millin Entered By: Kela Millin on 04/14/2020 14:22:56

## 2020-04-15 DIAGNOSIS — E785 Hyperlipidemia, unspecified: Secondary | ICD-10-CM | POA: Diagnosis not present

## 2020-04-15 DIAGNOSIS — K8020 Calculus of gallbladder without cholecystitis without obstruction: Secondary | ICD-10-CM | POA: Diagnosis not present

## 2020-04-15 DIAGNOSIS — G4730 Sleep apnea, unspecified: Secondary | ICD-10-CM | POA: Diagnosis not present

## 2020-04-15 DIAGNOSIS — Z7901 Long term (current) use of anticoagulants: Secondary | ICD-10-CM | POA: Diagnosis not present

## 2020-04-15 DIAGNOSIS — I872 Venous insufficiency (chronic) (peripheral): Secondary | ICD-10-CM | POA: Diagnosis not present

## 2020-04-15 DIAGNOSIS — J449 Chronic obstructive pulmonary disease, unspecified: Secondary | ICD-10-CM | POA: Diagnosis not present

## 2020-04-15 DIAGNOSIS — J9611 Chronic respiratory failure with hypoxia: Secondary | ICD-10-CM | POA: Diagnosis not present

## 2020-04-15 DIAGNOSIS — K5730 Diverticulosis of large intestine without perforation or abscess without bleeding: Secondary | ICD-10-CM | POA: Diagnosis not present

## 2020-04-15 DIAGNOSIS — L97211 Non-pressure chronic ulcer of right calf limited to breakdown of skin: Secondary | ICD-10-CM | POA: Diagnosis not present

## 2020-04-15 DIAGNOSIS — I5032 Chronic diastolic (congestive) heart failure: Secondary | ICD-10-CM | POA: Diagnosis not present

## 2020-04-15 DIAGNOSIS — I152 Hypertension secondary to endocrine disorders: Secondary | ICD-10-CM | POA: Diagnosis not present

## 2020-04-15 DIAGNOSIS — C3411 Malignant neoplasm of upper lobe, right bronchus or lung: Secondary | ICD-10-CM | POA: Diagnosis not present

## 2020-04-15 DIAGNOSIS — K2100 Gastro-esophageal reflux disease with esophagitis, without bleeding: Secondary | ICD-10-CM | POA: Diagnosis not present

## 2020-04-15 DIAGNOSIS — D509 Iron deficiency anemia, unspecified: Secondary | ICD-10-CM | POA: Diagnosis not present

## 2020-04-15 DIAGNOSIS — E1122 Type 2 diabetes mellitus with diabetic chronic kidney disease: Secondary | ICD-10-CM | POA: Diagnosis not present

## 2020-04-15 DIAGNOSIS — N186 End stage renal disease: Secondary | ICD-10-CM | POA: Diagnosis not present

## 2020-04-15 DIAGNOSIS — Z992 Dependence on renal dialysis: Secondary | ICD-10-CM | POA: Diagnosis not present

## 2020-04-15 DIAGNOSIS — M1030 Gout due to renal impairment, unspecified site: Secondary | ICD-10-CM | POA: Diagnosis not present

## 2020-04-15 DIAGNOSIS — H9190 Unspecified hearing loss, unspecified ear: Secondary | ICD-10-CM | POA: Diagnosis not present

## 2020-04-15 DIAGNOSIS — E1151 Type 2 diabetes mellitus with diabetic peripheral angiopathy without gangrene: Secondary | ICD-10-CM | POA: Diagnosis not present

## 2020-04-15 DIAGNOSIS — Z87891 Personal history of nicotine dependence: Secondary | ICD-10-CM | POA: Diagnosis not present

## 2020-04-15 DIAGNOSIS — L97221 Non-pressure chronic ulcer of left calf limited to breakdown of skin: Secondary | ICD-10-CM | POA: Diagnosis not present

## 2020-04-15 DIAGNOSIS — Z940 Kidney transplant status: Secondary | ICD-10-CM | POA: Diagnosis not present

## 2020-04-15 DIAGNOSIS — I4819 Other persistent atrial fibrillation: Secondary | ICD-10-CM | POA: Diagnosis not present

## 2020-04-15 DIAGNOSIS — I2510 Atherosclerotic heart disease of native coronary artery without angina pectoris: Secondary | ICD-10-CM | POA: Diagnosis not present

## 2020-04-15 NOTE — Progress Notes (Signed)
[name], [name] ([id number]) Visit Report for 04/14/2020 Debridement Details Patient Name: Date of Service: [name], DO RIS 04/14/2020 2:00 PM Medical Record Number: [id number] Patient Account Number: [id number] Date of Birth/Sex: Treating RN: 1946-01-02 (74 y.o. [name] Primary Care Provider: [name] Other Clinician: Referring Provider: Treating Provider/Extender: [name] in Treatment: 6 Debridement Performed for Assessment: Wound #2 Left,Anterior Lower Leg Performed By: Physician Ricard Dillon., MD Debridement Type: Debridement Severity of Tissue Pre Debridement: Fat layer exposed Level of Consciousness (Pre-procedure): Awake and Alert Pre-procedure Verification/Time Out Yes - 15:30 Taken: Start Time: 15:30 T Area Debrided (L x W): otal 2 (cm) x 2 (cm) = 4 (cm) Tissue and other material debrided: Non-Viable, Eschar Level: Non-Viable Tissue Debridement Description: Selective/Open Wound Instrument: Curette Specimen: Swab, Number of Specimens T aken: 1 Bleeding: None Hemostasis Achieved: Pressure End Time: 15:31 Procedural Pain: 0 Post Procedural Pain: 0 Response to Treatment: Procedure was tolerated well Level of Consciousness (Post- Awake and Alert procedure): Post Debridement Measurements of Total Wound Length: (cm) 2 Width: (cm) 2 Depth: (cm) 0.2 Volume: (cm) 0.628 Character of Wound/Ulcer Post Debridement: Improved Severity of Tissue Post Debridement: Fat layer exposed Post Procedure Diagnosis Same as Pre-procedure Electronic Signature(s) Signed: 04/14/2020 5:31:38 PM By: Levan Hurst RN, BSN Signed: 04/15/2020 7:37:15 AM By: Linton Ham MD Entered By: Levan Hurst on 04/14/2020 15:35:13 -------------------------------------------------------------------------------- HPI Details Patient Name: Date of Service: [name], DO RIS 04/14/2020 2:00 PM Medical Record Number: [id number] Patient Account  Number: [id number] Date of Birth/Sex: Treating RN: 06-Jan-1946 (74 y.o. [name] Primary Care Provider: [name] Other Clinician: Referring Provider: Treating Provider/Extender: [name] in Treatment: 6 History of Present Illness HPI Description: 74 year old female with bilateral leg wounds one on the left anterior leg and 1 right posterior leg for about 6 weeks, lately she has noted excessive drainage that soaks towels at night by the time she wakes up in the morning, especially from the left shin wound. Patient stated this started out as pinholes and became bigger. She had a couple 1 on the left and one on the right more distal near the feet close up on their own. She has been using Neosporin and clobetasol cream to the area and using gauze dressing. ABIs today right unobtainable, left 0.8, Interestingly her ABIs in 2018 were 0.47 on the right and 0.71 on the left, Performed by Dr. Earleen Newport Patient's history is complex with post renal transplant and kidney disease stage IV, history of lung cancer status post XRT insulin requiring diabetes, chronic A. , fib status post Maze procedure, diastolic heart failure, A. fib on Coumadin, aortic stenosis status post AVR, OSA without CPAP use. Patient denies any fevers chills shakes, has significant degree of pain in the shin area and the wound and has difficulty having any pressure on it 8/27; patient was admitted to clinic on 8/13. She has punched out areas on the left lateral and right lateral. Neither 1 of these have viable surfaces. We used silver alginate on them when she first came in she has 3+ pitting edema bilaterally in her lower extremities and some degree of PAD. We ordered arterial studies on her last time I do not know that those have been done. The patient complains of pain in the legs stating the wraps were too tight. She has home health I think changing the dressings. She says she was in  the ER on Monday because of  pain. She has chronic kidney disease stage IV and is on very high doses of torsemide Addendum; after patient left the clinic were able to resurrect her arterial studies from 8/23. This showed on the right and ABI 1.58 on IV noncompressible waveforms were monophasic great toe waveform was absent. On the left ABI at 1.58 again great toe waveform was absent. Monophasic waveforms 9/27; this is a patient that was admitted to the clinic in August by Dr. Evette Doffing. I saw her once on 8/27. She has ischemic wounds on her bilateral lower extremities. Her noninvasive studies were quoted above. She saw Dr. [name] on 04/10/2020 am not exactly sure who made this appointment. She tells [place] that she is in a lot of pain 9-1/2 out of 10 can only walk minimal distances at home. She has severe chronic renal failure status post renal transplant. She has a shunt in her left arm. She also has chronic atrial fibrillation on Coumadin. Dr. [name] felt she had slowly healing wounds I am not exactly sure these are healing. Dr. [name] felt that any attempt at imaging her would put her into dialysis range chronic renal failure which is probably accurate.. It was she was not felt to be a candidate for an open operation but might be a percutaneous revascularization. She has 1 on the right lateral lower leg and area on the left lateral. These are punched out ischemic looking. She had a new painful swelling on the left anterior. I unroofed some eschar on this to expose copious amounts of pus which I have obtained for culture. She has been on Keflex I think that it Dr. [name] actually prescribed and I have continued this today pending culture of the abscess. She is allergic to Bactrim and doxycycline Electronic Signature(s) Signed: 04/15/2020 7:37:15 AM By: Linton Ham MD Entered By: Linton Ham on 04/14/2020  16:19:23 -------------------------------------------------------------------------------- Physical Exam Details Patient Name: Date of Service: [name], DO RIS 04/14/2020 2:00 PM Medical Record Number: [id number] Patient Account Number: [id number] Date of Birth/Sex: Treating RN: May 04, 1946 (74 y.o. [name] Primary Care Provider: [name] Other Clinician: Referring Provider: Treating Provider/Extender: [name] in Treatment: 6 Constitutional Patient is hypertensive.. Pulse regular and within target range for patient.Marland Kitchen Respirations regular, non-labored and within target range.. Temperature is normal and within the target range for the patient.Marland Kitchen Appears in no distress. Cardiovascular I cannot feel I. Pedal pulses absent bilaterally.. Notes Wound exam; left lower extremity ulceration nonviable surface. I did not debride this. Also on the left that are necrotic area of skin with very painful swelling underneath this. I opened this area using a curette copious amounts of purulent drainage specimen obtained for culture On the right lateral calf the same nonviable surface no debridement. Electronic Signature(s) Signed: 04/15/2020 7:37:15 AM By: Linton Ham MD Entered By: Linton Ham on 04/14/2020 16:20:31 -------------------------------------------------------------------------------- Physician Orders Details Patient Name: Date of Service: [name], DO RIS 04/14/2020 2:00 PM Medical Record Number: [id number] Patient Account Number: [id number] Date of Birth/Sex: Treating RN: 10-17-1945 (74 y.o. [name] Primary Care Provider: [name] Other Clinician: Referring Provider: Treating Provider/Extender: [name] in Treatment: 6 Verbal / Phone Orders: No Diagnosis Coding ICD-10 Coding Code Description [phone] Non-pressure chronic ulcer of right calf limited to breakdown of  skin L97.221 Non-pressure chronic ulcer of left calf limited to breakdown of skin S50.53 Chronic diastolic (congestive) heart failure N18.4 Chronic kidney disease, stage 4 (severe) E11.22  Type 2 diabetes mellitus with diabetic chronic kidney disease Follow-up Appointments Return Appointment in 1 week. Dressing Change Frequency Other: - twice a week (wound clinic to change on Monday, home health to change on Thursday or Friday Wound Cleansing Clean wound with Normal Saline. - or wound cleanser May shower with protection. - cast protector Primary Wound Dressing Wound #1 Right,Posterior Lower Leg Iodoflex Wound #2 Left,Anterior Lower Leg Iodoflex Wound #3 Left,Proximal,Anterior Lower Leg Calcium Alginate with Silver Wound #4 Left,Posterior Lower Leg Iodoflex Secondary Dressing ABD pad Zetuvit or Kerramax - or other super absorbent pad Edema Control Kerlix and Coban - Bilateral - ****LIGHTLY WRAP**** Avoid standing for long periods of time Elevate legs to the level of the heart or above for 30 minutes daily and/or when sitting, a frequency of: - throughout the day [facility] skilled nursing for wound care. - [facility] naerobe culture (MICRO) - Left proximal anterior lower leg - (ICD10 L97.221 - Non-pressure Bacteria identified in Unspecified specimen by A chronic ulcer of left calf limited to breakdown of skin) LOINC Code: 194-1 Convenience Name: Anerobic culture Patient Medications llergies: ibuprofen, Sulfa (Sulfonamide Antibiotics), tramadol, doxycycline, Hydrocil, Bactrim, red dye A Notifications Medication Indication Start End 04/14/2020 cephalexin DOSE 1 - oral 500 mg tablet - 1 tablet oral every 8 hours x 7 days Electronic Signature(s) Signed: 04/14/2020 5:31:38 PM By: Levan Hurst RN, BSN Signed: 04/15/2020 7:37:15 AM By: Linton Ham MD Entered By: Levan Hurst on 04/14/2020 17:07:51 Prescription  04/14/2020 -------------------------------------------------------------------------------- Berneice Heinrich MD Patient Name: Provider: 06/06/1946 [id number] Date of Birth: NPI#Jesse Sans Sex: DEA #: [id number] Phone #: License #: [name] Patient Address: [address] [place], [place] [address] [place],  [address] [phone] Allergies ibuprofen; Sulfa (Sulfonamide Antibiotics); tramadol; doxycycline; Hydrocil; Bactrim; red dye Medication Medication: Route: Strength: Form: cephalexin 500 mg tablet oral 500 mg tablet Class: CEPHALOSPORIN ANTIBIOTICS - 1ST GENERATION Dose: Frequency / Time: Indication: 1 1 tablet oral every 8 hours x 7 days Number of Refills: Number of Units: 0 Generic Substitution: Start Date: End Date: One Time Use: Substitution Permitted 9/62/8366 No Note to Pharmacy: Hand Signature: Date(s): Electronic Signature(s) Signed: 04/14/2020 5:31:38 PM By: Levan Hurst RN, BSN Signed: 04/15/2020 7:37:15 AM By: Linton Ham MD Entered By: Levan Hurst on 04/14/2020 17:07:52 -------------------------------------------------------------------------------- Problem List Details Patient Name: Date of Service: [name], DO RIS 04/14/2020 2:00 PM Medical Record Number: [id number] Patient Account Number: [id number] Date of Birth/Sex: Treating RN: [birth year] (74 y.o. [name] Primary Care Provider: [name] Other Clinician: Referring Provider: Treating Provider/Extender: [name] in Treatment: 6 Active Problems ICD-10 Encounter Code Description Active Date MDM Diagnosis L97.211 Non-pressure chronic ulcer of right calf limited to breakdown of skin 02/29/2020 No Yes L02.416 Cutaneous abscess of left lower limb 04/14/2020 No Yes L97.221 Non-pressure chronic ulcer of left calf limited to  breakdown of skin 02/29/2020 No Yes N18.4 Chronic kidney disease, stage 4 (severe) 02/29/2020 No Yes E11.22 Type 2 diabetes mellitus with diabetic chronic kidney disease 02/29/2020 No Yes Inactive Problems ICD-10 Code Description Active Date Inactive Date K35.46 Chronic diastolic (congestive) heart failure 02/29/2020 02/29/2020 Resolved Problems Electronic Signature(s) Signed: 04/15/2020 7:37:15 AM By: Linton Ham MD Entered By: Linton Ham on 04/14/2020 15:35:41 -------------------------------------------------------------------------------- Progress Note Details Patient Name: Date of Service: [name], DO RIS 04/14/2020 2:00 PM Medical Record Number: [id number] Patient Account Number: [id number]  Date of Birth/Sex: Treating RN: 06/25/1946 (74 y.o. [name] Primary Care Provider: [name] Other Clinician: Referring Provider: Treating Provider/Extender: [name] in Treatment: 6 Subjective History of Present Illness (HPI) 74 year old female with bilateral leg wounds one on the left anterior leg and 1 right posterior leg for about 6 weeks, lately she has noted excessive drainage that soaks towels at night by the time she wakes up in the morning, especially from the left shin wound. Patient stated this started out as pinholes and became bigger. She had a couple 1 on the left and one on the right more distal near the feet close up on their own. She has been using Neosporin and clobetasol cream to the area and using gauze dressing. ABIs today right unobtainable, left 0.8, Interestingly her ABIs in 2018 were 0.47 on the right and 0.71 on the left, Performed by Dr. Earleen Newport Patient's history is complex with post renal transplant and kidney disease stage IV, history of lung cancer status post XRT insulin requiring diabetes, chronic A. , fib status post Maze procedure, diastolic heart failure, A. fib on Coumadin, aortic stenosis status post  AVR, OSA without CPAP use. Patient denies any fevers chills shakes, has significant degree of pain in the shin area and the wound and has difficulty having any pressure on it 8/27; patient was admitted to clinic on 8/13. She has punched out areas on the left lateral and right lateral. Neither 1 of these have viable surfaces. We used silver alginate on them when she first came in she has 3+ pitting edema bilaterally in her lower extremities and some degree of PAD. We ordered arterial studies on her last time I do not know that those have been done. The patient complains of pain in the legs stating the wraps were too tight. She has home health I think changing the dressings. She says she was in the ER on Monday because of pain. She has chronic kidney disease stage IV and is on very high doses of torsemide Addendum; after patient left the clinic were able to resurrect her arterial studies from 8/23. This showed on the right and ABI 1.58 on IV noncompressible waveforms were monophasic great toe waveform was absent. On the left ABI at 1.58 again great toe waveform was absent. Monophasic waveforms 9/27; this is a patient that was admitted to the clinic in August by Dr. Evette Doffing. I saw her once on 8/27. She has ischemic wounds on her bilateral lower extremities. Her noninvasive studies were quoted above. She saw Dr. [name] on 04/10/2020 am not exactly sure who made this appointment. She tells [place] that she is in a lot of pain 9-1/2 out of 10 can only walk minimal distances at home. She has severe chronic renal failure status post renal transplant. She has a shunt in her left arm. She also has chronic atrial fibrillation on Coumadin. Dr. [name] felt she had slowly healing wounds I am not exactly sure these are healing. Dr. [name] felt that any attempt at imaging her would put her into dialysis range chronic renal failure which is probably accurate.. It was she was not felt to be a candidate for an open operation  but might be a percutaneous revascularization. She has 1 on the right lateral lower leg and area on the left lateral. These are punched out ischemic looking. She had a new painful swelling on the left anterior. I unroofed some eschar on this to expose copious amounts of  pus which I have obtained for culture. She has been on Keflex I think that it Dr. [name] actually prescribed and I have continued this today pending culture of the abscess. She is allergic to Bactrim and doxycycline Objective Constitutional Patient is hypertensive.. Pulse regular and within target range for patient.Marland Kitchen Respirations regular, non-labored and within target range.. Temperature is normal and within the target range for the patient.Marland Kitchen Appears in no distress. Vitals Time Taken: 2:22 PM, Height: 60 in, Weight: 160 lbs, BMI: 31.2, Temperature: 98.7 F, Pulse: 71 bpm, Respiratory Rate: 19 breaths/min, Blood Pressure: 152/76 mmHg, Capillary Blood Glucose: 143 mg/dl. General Notes: patient stated CBG this morning was 143 Cardiovascular I cannot feel I. Pedal pulses absent bilaterally.. General Notes: Wound exam; left lower extremity ulceration nonviable surface. I did not debride this. Also on the left that are necrotic area of skin with very painful swelling underneath this. I opened this area using a curette copious amounts of purulent drainage specimen obtained for culture ooOn the right lateral calf the same nonviable surface no debridement. Integumentary (Hair, Skin) Wound #1 status is Open. Original cause of wound was Gradually Appeared. The wound is located on the Right,Posterior Lower Leg. The wound measures 0.9cm length x 0.9cm width x 0.1cm depth; 0.636cm^2 area and 0.064cm^3 volume. There is no tunneling or undermining noted. There is a small amount of serous drainage noted. The wound margin is distinct with the outline attached to the wound base. There is no granulation within the wound bed. There is a  large (67-100%) amount of necrotic tissue within the wound bed including Eschar and Adherent Slough. Wound #2 status is Open. Original cause of wound was Gradually Appeared. The wound is located on the Left,Anterior Lower Leg. The wound measures 2cm length x 2cm width x 0.2cm depth; 3.142cm^2 area and 0.628cm^3 volume. There is Fat Layer (Subcutaneous Tissue) exposed. There is no tunneling or undermining noted. There is a small amount of serous drainage noted. The wound margin is distinct with the outline attached to the wound base. There is no granulation within the wound bed. There is a large (67-100%) amount of necrotic tissue within the wound bed including Eschar and Adherent Slough. Wound #3 status is Open. Original cause of wound was Gradually Appeared. The wound is located on the Left,Proximal,Anterior Lower Leg. The wound measures 4cm length x 2cm width x 0.1cm depth; 6.283cm^2 area and 0.628cm^3 volume. There is no tunneling or undermining noted. There is a small amount of purulent drainage noted. The wound margin is distinct with the outline attached to the wound base. There is no granulation within the wound bed. There is a large (67-100%) amount of necrotic tissue within the wound bed including Eschar and Adherent Slough. Wound #4 status is Open. Original cause of wound was Gradually Appeared. The wound is located on the Left,Posterior Lower Leg. The wound measures 0.5cm length x 0.5cm width x 0.1cm depth; 0.196cm^2 area and 0.02cm^3 volume. There is Fat Layer (Subcutaneous Tissue) exposed. There is no tunneling or undermining noted. There is a small amount of serous drainage noted. The wound margin is distinct with the outline attached to the wound base. There is small (1-33%) pink granulation within the wound bed. There is a large (67-100%) amount of necrotic tissue within the wound bed including Adherent Slough. Assessment Active Problems ICD-10 Non-pressure chronic ulcer of right  calf limited to breakdown of skin Cutaneous abscess of left lower limb Non-pressure chronic ulcer of left calf limited to breakdown of skin  Chronic kidney disease, stage 4 (severe) Type 2 diabetes mellitus with diabetic chronic kidney disease Procedures Wound #2 Pre-procedure diagnosis of Wound #2 is a Diabetic Wound/Ulcer of the Lower Extremity located on the Left,Anterior Lower Leg .Severity of Tissue Pre Debridement is: Fat layer exposed. There was a Selective/Open Wound Non-Viable Tissue Debridement with a total area of 4 sq cm performed by Ricard Dillon., MD. With the following instrument(s): Curette to remove Non-Viable tissue/material. Material removed includes Eschar. 1 specimen was taken by a Swab and sent to the lab per facility protocol. A time out was conducted at 15:30, prior to the start of the procedure. There was no bleeding. The procedure was tolerated well with a pain level of 0 throughout and a pain level of 0 following the procedure. Post Debridement Measurements: 2cm length x 2cm width x 0.2cm depth; 0.628cm^3 volume. Character of Wound/Ulcer Post Debridement is improved. Severity of Tissue Post Debridement is: Fat layer exposed. Post procedure Diagnosis Wound #2: Same as Pre-Procedure Plan Follow-up Appointments: Return Appointment in 1 week. Dressing Change Frequency: Other: - twice a week (wound clinic to change on Monday, home health to change on Thursday or Friday Wound Cleansing: Clean wound with Normal Saline. - or wound cleanser May shower with protection. - cast protector Primary Wound Dressing: Wound #1 Right,Posterior Lower Leg: Iodoflex Wound #2 Left,Anterior Lower Leg: Iodoflex Wound #3 Left,Proximal,Anterior Lower Leg: Calcium Alginate with Silver Wound #4 Left,Posterior Lower Leg: Iodoflex Secondary Dressing: ABD pad Zetuvit or Kerramax - or other super absorbent pad Edema Control: Kerlix and Coban - Bilateral - ****LIGHTLY WRAP**** Avoid  standing for long periods of time Elevate legs to the level of the heart or above for 30 minutes daily and/or when sitting, a frequency of: - throughout the day Home Health: [facility] skilled nursing for wound care. - [facility] ordered were: Anerobic culture - Left proximal anterior lower leg The following medication(s) was prescribed: cephalexin oral 500 mg tablet 1 1 tablet oral every 8 hours x 7 days starting 04/14/2020 1. I am going to use silver alginate packing to the abscess on the left leg 2. I have continued the Keflex until we can culture the purulence we obtained. If she has MRSA probably will require linezolid or Nuzyra 3. It is very difficult here. She has claudication at rest or with minimal activity. Yet she is I agree at prohibitive risk for contrast dye on a transplanted kidney 4. In discussing things with her she is not planned for dialysis as of yet. 5. I hope she will come back next week I need to follow-up on the abscessed area Electronic Signature(s) Signed: 04/14/2020 5:10:05 PM By: Linton Ham MD Entered By: Linton Ham on 04/14/2020 17:10:04 -------------------------------------------------------------------------------- SuperBill Details Patient Name: Date of Service: [name], DO RIS 04/14/2020 Medical Record Number: [id number] Patient Account Number: [id number] Date of Birth/Sex: Treating RN: 05/02/1946 (74 y.o. [name] Primary Care Provider: [name] Other Clinician: Referring Provider: Treating Provider/Extender: [name] in Treatment: 6 Diagnosis Coding ICD-10 Codes Code Description [phone] Non-pressure chronic ulcer of right calf limited to breakdown of skin L02.416 Cutaneous abscess of left lower limb L97.221 Non-pressure chronic ulcer of left calf limited to breakdown of skin N18.4 Chronic kidney disease, stage 4 (severe) E11.22 Type 2 diabetes mellitus with diabetic  chronic kidney disease Facility Procedures CPT4 Code: [id number] ICD L0 Description: 27 - DEBRIDE WOUND 1ST 20 SQ CM OR < 1 -10 Diagnosis  Description 2.416 Cutaneous abscess of left lower limb Modifier: Quantity: Physician Procedures : CPT4 Code Description Modifier [id number] - WC PHYS LEVEL 4 - EST PT 25 ICD-10 Diagnosis Description L97.221 Non-pressure chronic ulcer of left calf limited to breakdown of skin L02.416 Cutaneous abscess of left lower limb Quantity: 1 : [id number] - WC PHYS DEBR WO ANESTH 20 SQ CM ICD-10 Diagnosis Description L02.416 Cutaneous abscess of left lower limb Quantity: 1 Electronic Signature(s) Signed: 04/15/2020 7:37:15 AM By: Linton Ham MD Entered By: Linton Ham on 04/14/2020 17:11:23

## 2020-04-16 LAB — AEROBIC CULTURE W GRAM STAIN (SUPERFICIAL SPECIMEN)

## 2020-04-17 ENCOUNTER — Other Ambulatory Visit: Payer: Self-pay

## 2020-04-17 NOTE — Patient Outreach (Signed)
[facility]) Care Management  04/17/2020  [name] [birth year] [id number]   Telephone assessment: Reviewed vascular and wound clinic note.  Placed call to patient with no answer. Left a message requesting a call back.  PLAN: will attempt again in 3 days.   Tomasa Rand, RN, BSN, CEN [facility] [employer] [phone]

## 2020-04-18 DIAGNOSIS — E1151 Type 2 diabetes mellitus with diabetic peripheral angiopathy without gangrene: Secondary | ICD-10-CM | POA: Diagnosis not present

## 2020-04-18 DIAGNOSIS — I872 Venous insufficiency (chronic) (peripheral): Secondary | ICD-10-CM | POA: Diagnosis not present

## 2020-04-18 DIAGNOSIS — I152 Hypertension secondary to endocrine disorders: Secondary | ICD-10-CM | POA: Diagnosis not present

## 2020-04-18 DIAGNOSIS — Z7901 Long term (current) use of anticoagulants: Secondary | ICD-10-CM | POA: Diagnosis not present

## 2020-04-18 DIAGNOSIS — L97221 Non-pressure chronic ulcer of left calf limited to breakdown of skin: Secondary | ICD-10-CM | POA: Diagnosis not present

## 2020-04-18 DIAGNOSIS — Z952 Presence of prosthetic heart valve: Secondary | ICD-10-CM | POA: Diagnosis not present

## 2020-04-18 DIAGNOSIS — L97211 Non-pressure chronic ulcer of right calf limited to breakdown of skin: Secondary | ICD-10-CM | POA: Diagnosis not present

## 2020-04-18 DIAGNOSIS — I2510 Atherosclerotic heart disease of native coronary artery without angina pectoris: Secondary | ICD-10-CM | POA: Diagnosis not present

## 2020-04-18 LAB — PROTIME-INR: INR: 2.3 — AB (ref 0.9–1.1)

## 2020-04-21 ENCOUNTER — Other Ambulatory Visit: Payer: Self-pay

## 2020-04-21 ENCOUNTER — Ambulatory Visit: Payer: Medicare Other | Admitting: Registered"

## 2020-04-21 ENCOUNTER — Ambulatory Visit (INDEPENDENT_AMBULATORY_CARE_PROVIDER_SITE_OTHER): Payer: Medicare Other | Admitting: Cardiology

## 2020-04-21 ENCOUNTER — Telehealth: Payer: Self-pay | Admitting: Cardiology

## 2020-04-21 DIAGNOSIS — Z5181 Encounter for therapeutic drug level monitoring: Secondary | ICD-10-CM

## 2020-04-21 DIAGNOSIS — I480 Paroxysmal atrial fibrillation: Secondary | ICD-10-CM | POA: Diagnosis not present

## 2020-04-21 NOTE — Telephone Encounter (Signed)
INR on 04/18/20 - venipuncture done INR 2.3 PT 23.7  INR on 9/25 INR 1.7 PT 17.8 * patient said that nurse did not call her this day about med changes  Patient has been cephalexin prior to 10/1 INR check (prescribed for legs)  Last warfarin dose change was around 9/16  Contact [place]: [phone] with dose changes, when next INR is to be done

## 2020-04-21 NOTE — Telephone Encounter (Signed)
Darlina Guys from [facility] health is calling with INR results.

## 2020-04-21 NOTE — Patient Outreach (Signed)
[facility]) Care Management  04/21/2020  [name] [birth year] [id number]   Telephone assessment:  Placed call to patient who reports she is okay. Reports CBG around 200. States she has a new wound to her lower leg that the wound center opened last week and drained. Reports she continues to take her keflex.  Reports she has a follow up wound center appointment tomorrow. Reports she has not been weighing due to leg pain. Reports she is wanted to move back up to the second floor of her house.  Continues to have legs wrapped by MD.  Reports she needs more wound dressing supplies. Encourage patient to talk to wound MD tomorrow at visit to find out what she needs and have the order sent to home health nurse. She voiced understanding.   PLAN: will follow up in 1 week and close case if stable and all goals are met.   Tomasa Rand, RN, BSN, CEN [facility] [employer] [phone]

## 2020-04-22 ENCOUNTER — Encounter: Payer: Self-pay | Admitting: Internal Medicine

## 2020-04-22 ENCOUNTER — Encounter (HOSPITAL_BASED_OUTPATIENT_CLINIC_OR_DEPARTMENT_OTHER): Payer: Medicare Other | Attending: Internal Medicine | Admitting: Internal Medicine

## 2020-04-22 ENCOUNTER — Other Ambulatory Visit: Payer: Self-pay

## 2020-04-22 ENCOUNTER — Ambulatory Visit (INDEPENDENT_AMBULATORY_CARE_PROVIDER_SITE_OTHER): Payer: Medicare Other | Admitting: Internal Medicine

## 2020-04-22 DIAGNOSIS — G8929 Other chronic pain: Secondary | ICD-10-CM

## 2020-04-22 DIAGNOSIS — M5440 Lumbago with sciatica, unspecified side: Secondary | ICD-10-CM | POA: Diagnosis not present

## 2020-04-22 DIAGNOSIS — I2510 Atherosclerotic heart disease of native coronary artery without angina pectoris: Secondary | ICD-10-CM | POA: Diagnosis not present

## 2020-04-22 DIAGNOSIS — E785 Hyperlipidemia, unspecified: Secondary | ICD-10-CM

## 2020-04-22 DIAGNOSIS — I872 Venous insufficiency (chronic) (peripheral): Secondary | ICD-10-CM | POA: Diagnosis not present

## 2020-04-22 DIAGNOSIS — E11622 Type 2 diabetes mellitus with other skin ulcer: Secondary | ICD-10-CM | POA: Diagnosis not present

## 2020-04-22 DIAGNOSIS — Z7901 Long term (current) use of anticoagulants: Secondary | ICD-10-CM

## 2020-04-22 DIAGNOSIS — E1140 Type 2 diabetes mellitus with diabetic neuropathy, unspecified: Secondary | ICD-10-CM

## 2020-04-22 DIAGNOSIS — E1151 Type 2 diabetes mellitus with diabetic peripheral angiopathy without gangrene: Secondary | ICD-10-CM | POA: Diagnosis not present

## 2020-04-22 DIAGNOSIS — Z923 Personal history of irradiation: Secondary | ICD-10-CM | POA: Insufficient documentation

## 2020-04-22 DIAGNOSIS — L97828 Non-pressure chronic ulcer of other part of left lower leg with other specified severity: Secondary | ICD-10-CM | POA: Insufficient documentation

## 2020-04-22 DIAGNOSIS — L97221 Non-pressure chronic ulcer of left calf limited to breakdown of skin: Secondary | ICD-10-CM | POA: Diagnosis not present

## 2020-04-22 DIAGNOSIS — G4733 Obstructive sleep apnea (adult) (pediatric): Secondary | ICD-10-CM | POA: Insufficient documentation

## 2020-04-22 DIAGNOSIS — L97812 Non-pressure chronic ulcer of other part of right lower leg with fat layer exposed: Secondary | ICD-10-CM | POA: Diagnosis not present

## 2020-04-22 DIAGNOSIS — Z794 Long term (current) use of insulin: Secondary | ICD-10-CM | POA: Insufficient documentation

## 2020-04-22 DIAGNOSIS — I5032 Chronic diastolic (congestive) heart failure: Secondary | ICD-10-CM | POA: Insufficient documentation

## 2020-04-22 DIAGNOSIS — E1122 Type 2 diabetes mellitus with diabetic chronic kidney disease: Secondary | ICD-10-CM | POA: Diagnosis not present

## 2020-04-22 DIAGNOSIS — I482 Chronic atrial fibrillation, unspecified: Secondary | ICD-10-CM | POA: Insufficient documentation

## 2020-04-22 DIAGNOSIS — Z952 Presence of prosthetic heart valve: Secondary | ICD-10-CM

## 2020-04-22 DIAGNOSIS — J449 Chronic obstructive pulmonary disease, unspecified: Secondary | ICD-10-CM | POA: Insufficient documentation

## 2020-04-22 DIAGNOSIS — Z940 Kidney transplant status: Secondary | ICD-10-CM | POA: Insufficient documentation

## 2020-04-22 DIAGNOSIS — I4819 Other persistent atrial fibrillation: Secondary | ICD-10-CM | POA: Diagnosis not present

## 2020-04-22 DIAGNOSIS — N184 Chronic kidney disease, stage 4 (severe): Secondary | ICD-10-CM | POA: Insufficient documentation

## 2020-04-22 DIAGNOSIS — Z6831 Body mass index (BMI) 31.0-31.9, adult: Secondary | ICD-10-CM | POA: Insufficient documentation

## 2020-04-22 DIAGNOSIS — L97218 Non-pressure chronic ulcer of right calf with other specified severity: Secondary | ICD-10-CM | POA: Insufficient documentation

## 2020-04-22 DIAGNOSIS — Z85118 Personal history of other malignant neoplasm of bronchus and lung: Secondary | ICD-10-CM | POA: Insufficient documentation

## 2020-04-22 DIAGNOSIS — C3411 Malignant neoplasm of upper lobe, right bronchus or lung: Secondary | ICD-10-CM | POA: Diagnosis not present

## 2020-04-22 DIAGNOSIS — M1030 Gout due to renal impairment, unspecified site: Secondary | ICD-10-CM | POA: Diagnosis not present

## 2020-04-22 DIAGNOSIS — G4730 Sleep apnea, unspecified: Secondary | ICD-10-CM

## 2020-04-22 DIAGNOSIS — I350 Nonrheumatic aortic (valve) stenosis: Secondary | ICD-10-CM | POA: Insufficient documentation

## 2020-04-22 DIAGNOSIS — Z992 Dependence on renal dialysis: Secondary | ICD-10-CM

## 2020-04-22 DIAGNOSIS — Z881 Allergy status to other antibiotic agents status: Secondary | ICD-10-CM | POA: Insufficient documentation

## 2020-04-22 DIAGNOSIS — E11621 Type 2 diabetes mellitus with foot ulcer: Secondary | ICD-10-CM | POA: Insufficient documentation

## 2020-04-22 DIAGNOSIS — Z87891 Personal history of nicotine dependence: Secondary | ICD-10-CM

## 2020-04-22 DIAGNOSIS — K2100 Gastro-esophageal reflux disease with esophagitis, without bleeding: Secondary | ICD-10-CM

## 2020-04-22 DIAGNOSIS — I152 Hypertension secondary to endocrine disorders: Secondary | ICD-10-CM | POA: Diagnosis not present

## 2020-04-22 DIAGNOSIS — I252 Old myocardial infarction: Secondary | ICD-10-CM | POA: Insufficient documentation

## 2020-04-22 DIAGNOSIS — K5730 Diverticulosis of large intestine without perforation or abscess without bleeding: Secondary | ICD-10-CM

## 2020-04-22 DIAGNOSIS — H9190 Unspecified hearing loss, unspecified ear: Secondary | ICD-10-CM

## 2020-04-22 DIAGNOSIS — R269 Unspecified abnormalities of gait and mobility: Secondary | ICD-10-CM | POA: Diagnosis not present

## 2020-04-22 DIAGNOSIS — K8020 Calculus of gallbladder without cholecystitis without obstruction: Secondary | ICD-10-CM

## 2020-04-22 DIAGNOSIS — Z87440 Personal history of urinary (tract) infections: Secondary | ICD-10-CM

## 2020-04-22 DIAGNOSIS — L97211 Non-pressure chronic ulcer of right calf limited to breakdown of skin: Secondary | ICD-10-CM | POA: Insufficient documentation

## 2020-04-22 DIAGNOSIS — L97822 Non-pressure chronic ulcer of other part of left lower leg with fat layer exposed: Secondary | ICD-10-CM | POA: Diagnosis not present

## 2020-04-22 DIAGNOSIS — J9611 Chronic respiratory failure with hypoxia: Secondary | ICD-10-CM

## 2020-04-22 DIAGNOSIS — D509 Iron deficiency anemia, unspecified: Secondary | ICD-10-CM

## 2020-04-22 DIAGNOSIS — N186 End stage renal disease: Secondary | ICD-10-CM | POA: Diagnosis not present

## 2020-04-22 DIAGNOSIS — I130 Hypertensive heart and chronic kidney disease with heart failure and stage 1 through stage 4 chronic kidney disease, or unspecified chronic kidney disease: Secondary | ICD-10-CM | POA: Insufficient documentation

## 2020-04-22 MED ORDER — OXYCODONE-ACETAMINOPHEN 5-325 MG PO TABS: 1.0000 | ORAL_TABLET | Freq: Three times a day (TID) | ORAL | 0 refills | Status: DC | PRN

## 2020-04-22 NOTE — Progress Notes (Signed)
Subjective:  [name], female    DOB: 1945-08-30  Age: 74 y.o. MRN: [id number]  CC: No chief complaint on file.   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 acetaminophen (TYLENOL) 500 MG tablet Take 500 mg by mouth every 6 (six) hours as needed for mild pain or headache.    Marland Kitchen amoxicillin (AMOXIL) 500 MG capsule TAKE 4 CAPSULES BY MOUTH 1 HOUR PRIOR TO DENTAL WORK 4 capsule 1  . cephALEXin (KEFLEX) 500 MG capsule Take 1 capsule (500 mg total) by mouth 2 (two) times daily. 20 capsule 0  . clobetasol ointment (TEMOVATE) 1.69 % Apply 1 application topically 2 (two) times daily. 60 g 1  . dapsone 25 MG tablet Take 25 mg by mouth daily.     Marland Kitchen gabapentin (NEURONTIN) 300 MG capsule Take 1 capsule (300 mg total) by mouth 3 (three) times daily. 90 capsule 5  . hydrALAZINE (APRESOLINE) 25 MG tablet Take 25 mg by mouth 3 (three) times daily.     . insulin glargine, 1 Unit Dial, (TOUJEO SOLOSTAR) 300 UNIT/ML Solostar Pen Inject 60 Units into the skin every morning. Titrate up by 1 unit a day if needed for goal sugars of 100-130 up to 50 units a day max 3 pen 3  . insulin lispro (HUMALOG KWIKPEN) 200 UNIT/ML KwikPen Inject 10 Units into the skin with breakfast, with lunch, and with evening meal. 9 mL 1  . Insulin Pen Needle (B-D UF III MINI PEN NEEDLES) 31G X 5 MM MISC USE TO ADMINISTER INSULIN FOUR TIMES A DAY DX E11.9 100 each 1  . levothyroxine (SYNTHROID) 25 MCG tablet TAKE 2 TABLETS (50 MCG TOTAL) BY MOUTH DAILY. 180 tablet 3  . lidocaine (XYLOCAINE) 5 % ointment Apply 1 application topically 4 (four) times daily as needed. 50 g 1  . metoprolol tartrate (LOPRESSOR) 25 MG tablet Take 0.5 tablets (12.5 mg total) by mouth 2 (two) times daily. 90 tablet 3  . ONETOUCH DELICA LANCETS 67E MISC Use to check blood sugars three times a day DX  E11.9 100 each 5  . ONETOUCH VERIO test strip USE TO TEST 3 TIMES DAILY. DX E11.9 100 strip 5  . oxyCODONE-acetaminophen (PERCOCET/ROXICET) 5-325 MG tablet Take 1 tablet by mouth every 8 (eight) hours as needed for severe pain. 60 tablet 0  . OXYGEN Inhale 3 L into the lungs as needed (for shortness of breath).     . predniSONE (DELTASONE) 5 MG tablet Take 1 tablet by mouth daily.    Marland Kitchen Propylene Glycol (SYSTANE BALANCE) 0.6 % SOLN Place 1-2 drops into both eyes 3 (three) times daily as needed (for dryness).    . repaglinide (PRANDIN) 0.5 MG tablet Take 0.5 mg by mouth 3 (three) times daily before meals.    . rosuvastatin (CRESTOR) 20 MG tablet Take 1 tablet (20 mg total) by mouth daily. (Patient taking differently: Take 20 mg by mouth every other day. ) 90 tablet 3  . tacrolimus (PROGRAF) 1 MG capsule Take 3 mg by mouth 2 (two) times daily.     Marland Kitchen torsemide (DEMADEX) 100 MG tablet Take 2 tablets (200 mg total) by mouth daily. 60 tablet 5  . triamcinolone cream (KENALOG) 0.1 % Apply 1 application topically 4 (four) times daily. 80 g 1  . warfarin (COUMADIN) 5 MG  tablet TAKE 1/2 TO 1 TABLET BY MOUTH DAILY AS DIRECTED BY COUMADIN CLINIC 90 tablet 1  . potassium chloride SA (KLOR-CON M20) 20 MEQ tablet Take 1 tablet (20 mEq total) by mouth daily. (Patient taking differently: Take 40 mEq by mouth daily. ) 30 tablet 3   No facility-administered medications prior to visit.    ROS: Review of Systems  Constitutional: Positive for fatigue. Negative for activity change, appetite change, chills and unexpected weight change.  HENT: Negative for congestion, mouth sores and sinus pressure.   Eyes: Negative for visual disturbance.  Respiratory: Positive for shortness of breath. Negative for cough and chest tightness.   Cardiovascular: Positive for leg swelling.  Gastrointestinal: Negative for abdominal pain and nausea.  Genitourinary: Negative for difficulty urinating, frequency and vaginal pain.    Musculoskeletal: Positive for arthralgias, back pain and gait problem.  Skin: Positive for wound. Negative for pallor and rash.  Neurological: Negative for dizziness, tremors, weakness, numbness and headaches.  Hematological: Bruises/bleeds easily.  Psychiatric/Behavioral: Negative for confusion, sleep disturbance and suicidal ideas. The patient is nervous/anxious.     Objective:  BP 112/60 (BP Location: Right Arm, Patient Position: Sitting, Cuff Size: Large)   Pulse 100   Temp 98.3 F (36.8 C) (Oral)   Ht 5' (1.524 m)   Wt 160 lb (72.6 kg)   SpO2 91%   BMI 31.25 kg/m   BP Readings from Last 3 Encounters:  04/22/20 112/60  04/10/20 132/70  03/12/20 (!) 144/81    Wt Readings from Last 3 Encounters:  04/22/20 160 lb (72.6 kg)  04/10/20 160 lb (72.6 kg)  03/12/20 160 lb (72.6 kg)    Physical Exam Constitutional:      General: She is not in acute distress.    Appearance: She is well-developed.  HENT:     Head: Normocephalic.     Right Ear: External ear normal.     Left Ear: External ear normal.     Nose: Nose normal.  Eyes:     General:        Right eye: No discharge.        Left eye: No discharge.     Conjunctiva/sclera: Conjunctivae normal.     Pupils: Pupils are equal, round, and reactive to light.  Neck:     Thyroid: No thyromegaly.     Vascular: No JVD.     Trachea: No tracheal deviation.  Cardiovascular:     Rate and Rhythm: Normal rate and regular rhythm.     Heart sounds: Normal heart sounds.  Pulmonary:     Effort: No respiratory distress.     Breath sounds: No stridor. No wheezing.  Abdominal:     General: Bowel sounds are normal. There is no distension.     Palpations: Abdomen is soft. There is no mass.     Tenderness: There is no abdominal tenderness. There is no guarding or rebound.  Musculoskeletal:        General: Tenderness present.     Cervical back: Normal range of motion and neck supple.     Left lower leg: Edema present.   Lymphadenopathy:     Cervical: No cervical adenopathy.  Skin:    Findings: Erythema present. No rash.  Neurological:     Mental Status: She is oriented to person, place, and time.     Cranial Nerves: No cranial nerve deficit.     Motor: Weakness present. No abnormal muscle tone.     Coordination: Coordination abnormal.  Gait: Gait abnormal.     Deep Tendon Reflexes: Reflexes normal.  Psychiatric:        Behavior: Behavior normal.        Thought Content: Thought content normal.        Judgment: Judgment normal.   B LEs are wrapped Surg shoes B    The pt needs to use a lightweight w/c to get around. 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  Also, obtaining history and performing comprehensive physical exam.  Additionally, counseling the patient regarding the above listed issues.   Finally, documenting clinical information in the health records re: w/c, coordination of care.  Lab Results  Component Value Date   WBC 9.9 03/10/2020   HGB 12.1 03/10/2020   HCT 39.5 03/10/2020   PLT 275 03/10/2020   GLUCOSE 708 (HH) 03/10/2020   CHOL 155 03/24/2018   TRIG 91 03/24/2018   HDL 64 03/24/2018   LDLDIRECT 89.3 06/13/2006   LDLCALC 73 03/24/2018   ALT 21 03/10/2020   AST 20 03/10/2020   NA 128 (L) 03/10/2020   K 4.1 03/10/2020   CL 91 (L) 03/10/2020   CREATININE 2.90 (H) 03/10/2020   BUN 46 (H) 03/10/2020   CO2 25 03/10/2020   TSH 2.15 01/31/2020   INR 2.3 (A) 04/18/2020   HGBA1C 12.3 (H) 01/31/2020   MICROALBUR 317.3 (H) 07/02/2016    No results found.  Assessment & Plan:   Follow-up: No follow-ups on file.  Walker Kehr, MD

## 2020-04-22 NOTE — Progress Notes (Signed)
REENE, HARLACHER ([id number]) Visit Report for 04/22/2020 HPI Details Patient Name: Date of Service: [name] 04/22/2020 1:45 PM Medical Record Number: [id number] Patient Account Number: [id number] Date of Birth/Sex: Treating RN: 1945/12/16 (74 y.o. [name] Primary Care Provider: [name] Other Clinician: Referring Provider: Treating Provider/Extender: [name] in Treatment: 7 History of Present Illness HPI Description: 74 year old female with bilateral leg wounds one on the left anterior leg and 1 right posterior leg for about 6 weeks, lately she has noted excessive drainage that soaks towels at night by the time she wakes up in the morning, especially from the left shin wound. Patient stated this started out as pinholes and became bigger. She had a couple 1 on the left and one on the right more distal near the feet close up on their own. She has been using Neosporin and clobetasol cream to the area and using gauze dressing. ABIs today right unobtainable, left 0.8, Interestingly her ABIs in 2018 were 0.47 on the right and 0.71 on the left, Performed by Dr. Earleen Newport Patient's history is complex with post renal transplant and kidney disease stage IV, history of lung cancer status post XRT insulin requiring diabetes, chronic A. , fib status post Maze procedure, diastolic heart failure, A. fib on Coumadin, aortic stenosis status post AVR, OSA without CPAP use. Patient denies any fevers chills shakes, has significant degree of pain in the shin area and the wound and has difficulty having any pressure on it 8/27; patient was admitted to clinic on 8/13. She has punched out areas on the left lateral and right lateral. Neither 1 of these have viable surfaces. We used silver alginate on them when she first came in she has 3+ pitting edema bilaterally in her lower extremities and some degree of PAD. We ordered arterial studies on her last time I do  not know that those have been done. The patient complains of pain in the legs stating the wraps were too tight. She has home health I think changing the dressings. She says she was in the ER on Monday because of pain. She has chronic kidney disease stage IV and is on very high doses of torsemide Addendum; after patient left the clinic were able to resurrect her arterial studies from 8/23. This showed on the right and ABI 1.58 on IV noncompressible waveforms were monophasic great toe waveform was absent. On the left ABI at 1.58 again great toe waveform was absent. Monophasic waveforms 9/27; this is a patient that was admitted to the clinic in August by Dr. Evette Doffing. I saw her once on 8/27. She has ischemic wounds on her bilateral lower extremities. Her noninvasive studies were quoted above. She saw Dr. [name] on 04/10/2020 am not exactly sure who made this appointment. She tells [place] that she is in a lot of pain 9-1/2 out of 10 can only walk minimal distances at home. She has severe chronic renal failure status post renal transplant. She has a shunt in her left arm. She also has chronic atrial fibrillation on Coumadin. Dr. [name] felt she had slowly healing wounds I am not exactly sure these are healing. Dr. [name] felt that any attempt at imaging her would put her into dialysis range chronic renal failure which is probably accurate.. It was she was not felt to be a candidate for an open operation but might be a percutaneous revascularization. She has 1 on the right lateral lower leg and area on the  left lateral. These are punched out ischemic looking. She had a new painful swelling on the left anterior. I unroofed some eschar on this to expose copious amounts of pus which I have obtained for culture. She has been on Keflex I think that it Dr. [name] actually prescribed and I have continued this today pending culture of the abscess. She is allergic to Bactrim and doxycycline 10/5; the patient is not doing  well. The small abscess that I noted last week on the left anterior lower leg just above her original wound grew Enterobacter cloacae I. This was resistant to cefazolin I therefore have her on cefdinir. Is today with a larger necrotic wound on her left anterior tibia where I cultured last time the other wounds on the left lower anterior tibia and the right lateral calf are about the same. Nonviable surface I think the patient is really in a lot of pain. I think this is claudication at rest for the most part. She is still not really willing to take the risk of an angiogram until her kidney function improves although I am not really sure where we are with that she follows with nephrology as well. She does have a shunt in her left arm The last lab work I see was in late August. At which time her creatinine was 2.90 estimated GFR of 17. However her glucose at that time was 700. She says she is hoping that her creatinine comes down/improves. I am not sure where we are with that. I cannot see her nephrologist notes Electronic Signature(s) Signed: 04/22/2020 5:35:49 PM By: Linton Ham MD Entered By: Linton Ham on 04/22/2020 15:20:21 -------------------------------------------------------------------------------- Physical Exam Details Patient Name: Date of Service: [name], DO RIS 04/22/2020 1:45 PM Medical Record Number: [id number] Patient Account Number: [id number] Date of Birth/Sex: Treating RN: 1946-01-13 (74 y.o. [name] Primary Care Provider: [name] Other Clinician: Referring Provider: Treating Provider/Extender: [name] in Treatment: 7 Constitutional Patient is hypertensive.. Pulse regular and within target range for patient.Marland Kitchen Respirations regular, non-labored and within target range.. Temperature is normal and within the target range for the patient.. She really looks very uncomfortable even sitting in the exam  table. Respiratory work of breathing is normal. Cardiovascular Pedal pulses are nonpalpable bilaterally. Integumentary (Hair, Skin) The abscess from last week has opened into a wound with a necrotic cover.. Notes Wound exam Left anterior tibia proximally. She had an abscess in this area last week with purulent drainage. It is now a wound covered with a necrotic surface partly black. There is no clear surrounding cellulitis however Her original wounds in the distal left anterior tibia and on the right lateral calf are about the same completely nonviable ischemic-looking surfaces Electronic Signature(s) Signed: 04/22/2020 5:35:49 PM By: Linton Ham MD Entered By: Linton Ham on 04/22/2020 15:22:36 -------------------------------------------------------------------------------- Physician Orders Details Patient Name: Date of Service: [name], DO RIS 04/22/2020 1:45 PM Medical Record Number: [id number] Patient Account Number: [id number] Date of Birth/Sex: Treating RN: April 10, 1946 (74 y.o. [name] Primary Care Provider: [name] Other Clinician: Referring Provider: Treating Provider/Extender: [name] in Treatment: 7 Verbal / Phone Orders: No Diagnosis Coding ICD-10 Coding Code Description [phone] Non-pressure chronic ulcer of right calf limited to breakdown of skin L02.416 Cutaneous abscess of left lower limb L97.221 Non-pressure chronic ulcer of left calf limited to breakdown of skin N18.4 Chronic kidney disease, stage 4 (severe) E11.22 Type 2 diabetes mellitus  with diabetic chronic kidney disease Follow-up Appointments Return Appointment in 1 week. Dressing Change Frequency Other: - twice a week (wound clinic to change on Monday, home health to change on Thursday or Friday Wound Cleansing Clean wound with Normal Saline. - or wound cleanser May shower with protection. - cast protector Primary Wound Dressing Wound #1  Right,Posterior Lower Leg Iodoflex Wound #2 Left,Anterior Lower Leg Iodoflex Wound #3 Left,Proximal,Anterior Lower Leg Iodoflex Wound #4 Left,Posterior Lower Leg Iodoflex Secondary Dressing ABD pad Zetuvit or Kerramax - or other super absorbent pad Edema Control Kerlix and Coban - Bilateral - ****LIGHTLY WRAP**** Avoid standing for long periods of time Elevate legs to the level of the heart or above for 30 minutes daily and/or when sitting, a frequency of: - throughout the day [place] skilled nursing for wound care. - [facility] Patient Medications llergies: ibuprofen, Sulfa (Sulfonamide Antibiotics), tramadol, doxycycline, Hydrocil, Bactrim, red dye A Notifications Medication Indication Start End 04/22/2020 cefdinir DOSE oral 300 mg capsule - 1 capsule oral q24h for a further 7 days (continuing rx) Electronic Signature(s) Signed: 04/22/2020 3:24:10 PM By: Linton Ham MD Entered By: Linton Ham on 04/22/2020 15:24:08 -------------------------------------------------------------------------------- Problem List Details Patient Name: Date of Service: [name], DO RIS 04/22/2020 1:45 PM Medical Record Number: [id number] Patient Account Number: [id number] Date of Birth/Sex: Treating RN: 11-22-1945 (74 y.o. [name] Primary Care Provider: [name] Other Clinician: Referring Provider: Treating Provider/Extender: [name] in Treatment: 7 Active Problems ICD-10 Encounter Code Description Active Date MDM Diagnosis L97.211 Non-pressure chronic ulcer of right calf limited to breakdown of skin 02/29/2020 No Yes L02.416 Cutaneous abscess of left lower limb 04/14/2020 No Yes L97.221 Non-pressure chronic ulcer of left calf limited to breakdown of skin 02/29/2020 No Yes N18.4 Chronic kidney disease, stage 4 (severe) 02/29/2020 No Yes E11.22 Type 2 diabetes mellitus with diabetic chronic kidney disease  02/29/2020 No Yes Inactive Problems ICD-10 Code Description Active Date Inactive Date U27.25 Chronic diastolic (congestive) heart failure 02/29/2020 02/29/2020 Resolved Problems Electronic Signature(s) Signed: 04/22/2020 5:35:49 PM By: Linton Ham MD Entered By: Linton Ham on 04/22/2020 15:15:53 -------------------------------------------------------------------------------- Progress Note Details Patient Name: Date of Service: [name], DO RIS 04/22/2020 1:45 PM Medical Record Number: [id number] Patient Account Number: [id number] Date of Birth/Sex: Treating RN: 1946-01-04 (74 y.o. [name] Primary Care Provider: [name] Other Clinician: Referring Provider: Treating Provider/Extender: [name] in Treatment: 7 Subjective History of Present Illness (HPI) 74 year old female with bilateral leg wounds one on the left anterior leg and 1 right posterior leg for about 6 weeks, lately she has noted excessive drainage that soaks towels at night by the time she wakes up in the morning, especially from the left shin wound. Patient stated this started out as pinholes and became bigger. She had a couple 1 on the left and one on the right more distal near the feet close up on their own. She has been using Neosporin and clobetasol cream to the area and using gauze dressing. ABIs today right unobtainable, left 0.8, Interestingly her ABIs in 2018 were 0.47 on the right and 0.71 on the left, Performed by Dr. Earleen Newport Patient's history is complex with post renal transplant and kidney disease stage IV, history of lung cancer status post XRT insulin requiring diabetes, chronic A. , fib status post Maze procedure, diastolic heart failure, A. fib on Coumadin, aortic stenosis status post AVR, OSA without CPAP use. Patient denies  any fevers chills shakes, has significant degree of pain in the shin area and the wound and has difficulty having any pressure on  it 8/27; patient was admitted to clinic on 8/13. She has punched out areas on the left lateral and right lateral. Neither 1 of these have viable surfaces. We used silver alginate on them when she first came in she has 3+ pitting edema bilaterally in her lower extremities and some degree of PAD. We ordered arterial studies on her last time I do not know that those have been done. The patient complains of pain in the legs stating the wraps were too tight. She has home health I think changing the dressings. She says she was in the ER on Monday because of pain. She has chronic kidney disease stage IV and is on very high doses of torsemide Addendum; after patient left the clinic were able to resurrect her arterial studies from 8/23. This showed on the right and ABI 1.58 on IV noncompressible waveforms were monophasic great toe waveform was absent. On the left ABI at 1.58 again great toe waveform was absent. Monophasic waveforms 9/27; this is a patient that was admitted to the clinic in August by Dr. Evette Doffing. I saw her once on 8/27. She has ischemic wounds on her bilateral lower extremities. Her noninvasive studies were quoted above. She saw Dr. [name] on 04/10/2020 am not exactly sure who made this appointment. She tells [place] that she is in a lot of pain 9-1/2 out of 10 can only walk minimal distances at home. She has severe chronic renal failure status post renal transplant. She has a shunt in her left arm. She also has chronic atrial fibrillation on Coumadin. Dr. [name] felt she had slowly healing wounds I am not exactly sure these are healing. Dr. [name] felt that any attempt at imaging her would put her into dialysis range chronic renal failure which is probably accurate.. It was she was not felt to be a candidate for an open operation but might be a percutaneous revascularization. She has 1 on the right lateral lower leg and area on the left lateral. These are punched out ischemic looking. She had a new  painful swelling on the left anterior. I unroofed some eschar on this to expose copious amounts of pus which I have obtained for culture. She has been on Keflex I think that it Dr. [name] actually prescribed and I have continued this today pending culture of the abscess. She is allergic to Bactrim and doxycycline 10/5; the patient is not doing well. The small abscess that I noted last week on the left anterior lower leg just above her original wound grew Enterobacter cloacae I. This was resistant to cefazolin I therefore have her on cefdinir. Is today with a larger necrotic wound on her left anterior tibia where I cultured last time the other wounds on the left lower anterior tibia and the right lateral calf are about the same. Nonviable surface I think the patient is really in a lot of pain. I think this is claudication at rest for the most part. She is still not really willing to take the risk of an angiogram until her kidney function improves although I am not really sure where we are with that she follows with nephrology as well. She does have a shunt in her left arm The last lab work I see was in late August. At which time her creatinine was 2.90 estimated GFR of 17. However her glucose at  that time was 700. She says she is hoping that her creatinine comes down/improves. I am not sure where we are with that. I cannot see her nephrologist notes Objective Constitutional Patient is hypertensive.. Pulse regular and within target range for patient.Marland Kitchen Respirations regular, non-labored and within target range.. Temperature is normal and within the target range for the patient.. She really looks very uncomfortable even sitting in the exam table. Vitals Time Taken: 1:35 PM, Height: 60 in, Weight: 160 lbs, BMI: 31.2, Temperature: 98.8 F, Pulse: 98 bpm, Respiratory Rate: 19 breaths/min, Blood Pressure: 177/94 mmHg, Capillary Blood Glucose: 324 mg/dl. Respiratory work of breathing is  normal. Cardiovascular Pedal pulses are nonpalpable bilaterally. General Notes: Wound exam ooLeft anterior tibia proximally. She had an abscess in this area last week with purulent drainage. It is now a wound covered with a necrotic surface partly black. There is no clear surrounding cellulitis however ooHer original wounds in the distal left anterior tibia and on the right lateral calf are about the same completely nonviable ischemic-looking surfaces Integumentary (Hair, Skin) The abscess from last week has opened into a wound with a necrotic cover.. Wound #1 status is Open. Original cause of wound was Gradually Appeared. The wound is located on the Right,Posterior Lower Leg. The wound measures 0.5cm length x 0.7cm width x 0.1cm depth; 0.275cm^2 area and 0.027cm^3 volume. There is no tunneling or undermining noted. There is a small amount of serous drainage noted. The wound margin is distinct with the outline attached to the wound base. There is no granulation within the wound bed. There is a large (67-100%) amount of necrotic tissue within the wound bed including Eschar and Adherent Slough. Wound #2 status is Open. Original cause of wound was Gradually Appeared. The wound is located on the Left,Anterior Lower Leg. The wound measures 1.8cm length x 1.7cm width x 0.2cm depth; 2.403cm^2 area and 0.481cm^3 volume. There is Fat Layer (Subcutaneous Tissue) exposed. There is no tunneling or undermining noted. There is a small amount of serous drainage noted. The wound margin is distinct with the outline attached to the wound base. There is no granulation within the wound bed. There is a large (67-100%) amount of necrotic tissue within the wound bed including Eschar and Adherent Slough. Wound #3 status is Open. Original cause of wound was Gradually Appeared. The wound is located on the Left,Proximal,Anterior Lower Leg. The wound measures 4cm length x 1.7cm width x 0.2cm depth; 5.341cm^2 area and  1.068cm^3 volume. There is Fat Layer (Subcutaneous Tissue) exposed. There is no tunneling or undermining noted. There is a small amount of serous drainage noted. The wound margin is distinct with the outline attached to the wound base. There is no granulation within the wound bed. There is a large (67-100%) amount of necrotic tissue within the wound bed including Eschar and Adherent Slough. Wound #4 status is Open. Original cause of wound was Gradually Appeared. The wound is located on the Left,Posterior Lower Leg. The wound measures 0.3cm length x 0.3cm width x 0.1cm depth; 0.071cm^2 area and 0.007cm^3 volume. There is Fat Layer (Subcutaneous Tissue) exposed. There is no tunneling or undermining noted. There is a small amount of serous drainage noted. The wound margin is distinct with the outline attached to the wound base. There is small (1-33%) pink granulation within the wound bed. There is a large (67-100%) amount of necrotic tissue within the wound bed including Adherent Slough. Assessment Active Problems ICD-10 Non-pressure chronic ulcer of right calf limited to breakdown of skin  Cutaneous abscess of left lower limb Non-pressure chronic ulcer of left calf limited to breakdown of skin Chronic kidney disease, stage 4 (severe) Type 2 diabetes mellitus with diabetic chronic kidney disease Procedures Wound #1 Pre-procedure diagnosis of Wound #1 is a Diabetic Wound/Ulcer of the Lower Extremity located on the Right,Posterior Lower Leg . There was a Double Layer Compression Therapy Procedure by [name], RN. Post procedure Diagnosis Wound #1: Same as Pre-Procedure Wound #2 Pre-procedure diagnosis of Wound #2 is a Diabetic Wound/Ulcer of the Lower Extremity located on the Left,Anterior Lower Leg . There was a Double Layer Compression Therapy Procedure by [name], RN. Post procedure Diagnosis Wound #2: Same as Pre-Procedure Wound #3 Pre-procedure diagnosis of Wound #3 is a Diabetic  Wound/Ulcer of the Lower Extremity located on the Left,Proximal,Anterior Lower Leg . There was a Double Layer Compression Therapy Procedure by [name], RN. Post procedure Diagnosis Wound #3: Same as Pre-Procedure Wound #4 Pre-procedure diagnosis of Wound #4 is a Diabetic Wound/Ulcer of the Lower Extremity located on the Left,Posterior Lower Leg . There was a Double Layer Compression Therapy Procedure by [name], RN. Post procedure Diagnosis Wound #4: Same as Pre-Procedure Plan Follow-up Appointments: Return Appointment in 1 week. Dressing Change Frequency: Other: - twice a week (wound clinic to change on Monday, home health to change on Thursday or Friday Wound Cleansing: Clean wound with Normal Saline. - or wound cleanser May shower with protection. - cast protector Primary Wound Dressing: Wound #1 Right,Posterior Lower Leg: Iodoflex Wound #2 Left,Anterior Lower Leg: Iodoflex Wound #3 Left,Proximal,Anterior Lower Leg: Iodoflex Wound #4 Left,Posterior Lower Leg: Iodoflex Secondary Dressing: ABD pad Zetuvit or Kerramax - or other super absorbent pad Edema Control: Kerlix and Coban - Bilateral - ****LIGHTLY WRAP**** Avoid standing for long periods of time Elevate legs to the level of the heart or above for 30 minutes daily and/or when sitting, a frequency of: - throughout the day Home Health: [place] skilled nursing for wound care. - [facility] The following medication(s) was prescribed: cefdinir oral 300 mg capsule 1 capsule oral q24h for a further 7 days (continuing rx) starting 04/22/2020 1. Severe chronic renal failure. She has a dialysis shunt in her left arm 2. I did talk to her about the risk of doing an angiogram in her lower extremities and she is not willing to go through this hoping that her creatinine will improve. 3. Nevertheless I think she is in severe pain related to ischemic peripheral vascular disease 4. I have represcribed an additional 7 days  of cefdinir. I am hopeful that will cover the Enterobacter and prevent any further tissue breakdown 5. Last creatinine I see was 2.9 however at that time her glucose was 700 so perhaps there is a potential for improvement Electronic Signature(s) Signed: 04/22/2020 5:35:49 PM By: Linton Ham MD Entered By: Linton Ham on 04/22/2020 15:25:36 -------------------------------------------------------------------------------- SuperBill Details Patient Name: Date of Service: [name], DO RIS 04/22/2020 Medical Record Number: [id number] Patient Account Number: [id number] Date of Birth/Sex: Treating RN: [birth year] (74 y.o. [name] Primary Care Provider: [name] Other Clinician: Referring Provider: Treating Provider/Extender: [name] in Treatment: 7 Diagnosis Coding ICD-10 Codes Code Description [phone] Non-pressure chronic ulcer of right calf limited to breakdown of skin L02.416 Cutaneous abscess of left lower limb L97.221 Non-pressure chronic ulcer of left calf limited to breakdown of skin N18.4 Chronic kidney disease, stage 4 (severe) E11.22 Type 2 diabetes mellitus with diabetic chronic kidney disease  Physician Procedures : CPT4 Code Description Modifier [id number] - WC PHYS LEVEL 3 - EST PT ICD-10 Diagnosis Description L97.211 Non-pressure chronic ulcer of right calf limited to breakdown of skin L97.221 Non-pressure chronic ulcer of left calf limited to breakdown of  skin L02.416 Cutaneous abscess of left lower limb N18.4 Chronic kidney disease, stage 4 (severe) Quantity: 1 Electronic Signature(s) Signed: 04/22/2020 5:35:49 PM By: Linton Ham MD Entered By: Linton Ham on 04/22/2020 15:26:10

## 2020-04-22 NOTE — Patient Instructions (Signed)
The pt needs to use a lightweight w/c to get around. 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

## 2020-04-22 NOTE — Assessment & Plan Note (Signed)
Percocet prn  Potential benefits of a long term oxycodone use as well as potential risks (i.e. addiction risk (low), apnea etc) and complications (i.e. Somnolence, seizures, constipation and others) were explained to the patient and were aknowledged.

## 2020-04-22 NOTE — Assessment & Plan Note (Signed)
The pt needs to use a lightweight w/c to get around. 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

## 2020-04-22 NOTE — Progress Notes (Signed)
[name], [name] ([id number]) Visit Report for 04/22/2020 Arrival Information Details Patient Name: Date of Service: [name] RIS 04/22/2020 1:45 PM Medical Record Number: [id number] Patient Account Number: [id number] Date of Birth/Sex: Treating RN: 14-Nov-1945 (74 y.o. [name] Primary Care [name]: [name] Other Clinician: Referring [name]: Treating [name]/Extender: [name] in Treatment: 7 Visit Information History Since Last Visit Added or deleted any medications: No Patient Arrived: Wheel Chair Any new allergies or adverse reactions: No Arrival Time: 13:32 Had a fall or experienced change in No Accompanied By: family member activities of daily living that may affect Transfer Assistance: None risk of falls: Patient Identification Verified: Yes Signs or symptoms of abuse/neglect since last visito No Secondary Verification Process Completed: Yes Hospitalized since last visit: No Patient Has Alerts: Yes Implantable device outside of the clinic excluding No Patient Alerts: Patient on Blood Thinner cellular tissue based products placed in the center unable to obtain ABI on R since last visit: Has Dressing in Place as Prescribed: Yes Pain Present Now: Yes Electronic Signature(s) Signed: 04/22/2020 2:32:03 PM By: [name] Entered By: [name] on 04/22/2020 13:35:47 -------------------------------------------------------------------------------- Compression Therapy Details Patient Name: Date of Service: [name] Mode, DO RIS 04/22/2020 1:45 PM Medical Record Number: [id number] Patient Account Number: [id number] Date of Birth/Sex: Treating RN: Sep 01, 1945 (74 y.o. [name] Primary Care [name]: [name] Other Clinician: Referring [name]: Treating [name]/Extender: [name] in Treatment: 7 Compression Therapy Performed for Wound Assessment: Wound #1  Right,Posterior Lower Leg Performed By: Clinician [name] Coria, RN Compression Type: Double Layer Post Procedure Diagnosis Same as Pre-procedure Electronic Signature(s) Signed: 04/22/2020 5:35:40 PM By: [name] Coria RN Entered By: [name] on 04/22/2020 14:52:10 -------------------------------------------------------------------------------- Compression Therapy Details Patient Name: Date of Service: [name] Mode, DO RIS 04/22/2020 1:45 PM Medical Record Number: [id number] Patient Account Number: [id number] Date of Birth/Sex: Treating RN: September 12, 1945 (74 y.o. [name] Primary Care [name]: [name] Other Clinician: Referring [name]: Treating [name]/Extender: [name] in Treatment: 7 Compression Therapy Performed for Wound Assessment: Wound #2 Left,Anterior Lower Leg Performed By: Clinician [name] Coria, RN Compression Type: Double Layer Post Procedure Diagnosis Same as Pre-procedure Electronic Signature(s) Signed: 04/22/2020 5:35:40 PM By: [name] Coria RN Entered By: [name] on 04/22/2020 14:52:10 -------------------------------------------------------------------------------- Compression Therapy Details Patient Name: Date of Service: [name] Mode, DO RIS 04/22/2020 1:45 PM Medical Record Number: [id number] Patient Account Number: [id number] Date of Birth/Sex: Treating RN: September 20, 1945 (74 y.o. [name] Primary Care Jaylene Arrowood: [name] Other Clinician: Referring [name]: Treating [name]/Extender: [name] in Treatment: 7 Compression Therapy Performed for Wound Assessment: Wound #3 Left,Proximal,Anterior Lower Leg Performed By: Clinician [name] Coria, RN Compression Type: Double Layer Post Procedure Diagnosis Same as Pre-procedure Electronic Signature(s) Signed: 04/22/2020 5:35:40 PM By: [name] Coria RN Entered By: [name] on 04/22/2020  14:52:11 -------------------------------------------------------------------------------- Compression Therapy Details Patient Name: Date of Service: [name] Mode, DO RIS 04/22/2020 1:45 PM Medical Record Number: [id number] Patient Account Number: [id number] Date of Birth/Sex: Treating RN: 1945-12-16 (74 y.o. [name] Primary Care [name]: [name] Other Clinician: Referring [name]: Treating [name]/Extender: [name] in Treatment: 7 Compression Therapy Performed for Wound Assessment: Wound #4 Left,Posterior Lower Leg Performed By: Clinician [name] Coria, RN Compression Type: Double Layer Post Procedure Diagnosis Same as Pre-procedure Electronic Signature(s) Signed: 04/22/2020 5:35:40 PM By: [name] Coria RN Signed: 04/22/2020  5:35:40 PM By: [name] Coria RN Entered By: [name] on 04/22/2020 14:52:11 -------------------------------------------------------------------------------- Encounter Discharge Information Details Patient Name: Date of Service: [name] [name] Monte, DO RIS 04/22/2020 1:45 PM Medical Record Number: [id number] Patient Account Number: [id number] Date of Birth/Sex: Treating RN: Jun 01, 1946 (74 y.o. [name] Primary Care [name]: [name] Other Clinician: Referring [name]: Treating [name]/Extender: [name] in Treatment: 7 Encounter Discharge Information Items Discharge Condition: Stable Ambulatory Status: Wheelchair Discharge Destination: Home Transportation: Private Auto Accompanied By: family member Schedule Follow-up Appointment: Yes Clinical Summary of Care: Patient Declined Electronic Signature(s) Signed: 04/22/2020 5:16:50 PM By: [name] Entered By: [name] on 04/22/2020 15:04:21 -------------------------------------------------------------------------------- Lower Extremity Assessment Details Patient Name: Date of  Service: [name] Mode, DO RIS 04/22/2020 1:45 PM Medical Record Number: [id number] Patient Account Number: [id number] Date of Birth/Sex: Treating RN: 12-04-1945 (74 y.o. [name] Primary Care Keandria Berrocal: [name] Other Clinician: Referring Cheyan Frees: Treating Alwyn Cordner/Extender: [name] in Treatment: 7 Edema Assessment Assessed: [Left: No] [Right: No] Edema: [Left: Yes] [Right: Yes] Calf Left: Right: Point of Measurement: 38 cm From Medial Instep 32.5 cm 34.5 cm Ankle Left: Right: Point of Measurement: 9 cm From Medial Instep 21 cm 22 cm Vascular Assessment Pulses: Dorsalis Pedis Palpable: [Left:No] [Right:No] Electronic Signature(s) Signed: 04/22/2020 5:16:50 PM By: [name] Entered By: [name] on 04/22/2020 13:40:42 -------------------------------------------------------------------------------- Multi Wound Chart Details Patient Name: Date of Service: [name] Mode, DO RIS 04/22/2020 1:45 PM Medical Record Number: [id number] Patient Account Number: [id number] Date of Birth/Sex: Treating RN: 18-Sep-1945 (74 y.o. [name] Primary Care Lashana Spang: [name] Other Clinician: Referring Dawt Reeb: Treating Riko Lumsden/Extender: [name] in Treatment: 7 Vital Signs Height(in): 60 Capillary Blood Glucose(mg/dl): 324 Weight(lbs): 160 Pulse(bpm): 37 Body Mass Index(BMI): 58 Blood Pressure(mmHg): 177/94 Temperature(F): 98.8 Respiratory Rate(breaths/min): 11 Photos: [1:No Photos Right, Posterior Lower Leg] [2:No Photos Left, Anterior Lower Leg] [3:No Photos Left, Proximal, Anterior Lower Leg] Wound Location: [1:Gradually Appeared] [2:Gradually Appeared] [3:Gradually Appeared] Wounding Event: [1:Diabetic Wound/Ulcer of the Lower] [2:Diabetic Wound/Ulcer of the Lower] [3:Diabetic Wound/Ulcer of the Lower] Primary Etiology: [1:Extremity Chronic Obstructive Pulmonary]  [2:Extremity Chronic Obstructive Pulmonary] [3:Extremity Chronic Obstructive Pulmonary] Comorbid History: [1:Disease (COPD), Hypertension, Myocardial Infarction, Type II Diabetes Myocardial Infarction, Type II Diabetes Myocardial Infarction, Type II Diabetes 01/17/2020] [2:Disease (COPD), Hypertension, 01/17/2020] [3:Disease (COPD),  Hypertension, 04/07/2020] Date Acquired: [1:7] [2:7] [3:1] Weeks of Treatment: [1:Open] [2:Open] [3:Open] Wound Status: [1:No] [2:No] [3:Yes] Clustered Wound: [1:N/A] [2:N/A] [3:3] Clustered Quantity: [1:0.5x0.7x0.1] [2:1.8x1.7x0.2] [3:4x1.7x0.2] Measurements L x W x D (cm) [1:0.275] [2:2.403] [3:5.341] A (cm) : rea [1:0.027] [2:0.481] [3:1.068] Volume (cm) : [1:-40.30%] [2:-178.10%] [3:15.00%] % Reduction in A rea: [1:30.80%] [2:-459.30%] [3:-70.10%] % Reduction in Volume: [1:Grade 2] [2:Grade 2] [3:Grade 2] Classification: [1:Small] [2:Small] [3:Small] Exudate A mount: [1:Serous] [2:Serous] [3:Serous] Exudate Type: [1:amber] [2:amber] [3:amber] Exudate Color: [1:Distinct, outline attached] [2:Distinct, outline attached] [3:Distinct, outline attached] Wound Margin: [1:None Present (0%)] [2:None Present (0%)] [3:None Present (0%)] Granulation A mount: [1:N/A] [2:N/A] [3:N/A] Granulation Quality: [1:Large (67-100%)] [2:Large (67-100%)] [3:Large (67-100%)] Necrotic A mount: [1:Eschar, Adherent Slough] [2:Eschar, Adherent Slough] [3:Eschar, Adherent Slough] Necrotic Tissue: [1:Fascia: No] [2:Fat Layer (Subcutaneous Tissue): Yes Fat Layer (Subcutaneous Tissue): Yes] Exposed Structures: [1:Fat Layer (Subcutaneous Tissue): No Fascia: No Tendon: No Muscle: No Joint: No Bone: No None] [2:Tendon: No Muscle: No Joint: No Bone: No None] [3:Fascia: No Tendon: No Muscle: No Joint: No Bone: No None] Epithelialization: [1:Compression Therapy] [2:Compression  Therapy] [3:Compression Bantry Wound Number: 4 N/A N/A Photos: No Photos N/A N/A Left, Posterior Lower Leg N/A  N/A Wound Location: Gradually Appeared N/A N/A Wounding Event: Diabetic Wound/Ulcer of the Lower N/A N/A Primary Etiology: Extremity Chronic Obstructive Pulmonary N/A N/A Comorbid History: Disease (COPD), Hypertension, Myocardial Infarction, Type II Diabetes 04/07/2020 N/A N/A Date Acquired: 1 N/A N/A Weeks of Treatment: Open N/A N/A Wound Status: No N/A N/A Clustered Wound: N/A N/A N/A Clustered Quantity: 0.3x0.3x0.1 N/A N/A Measurements L x W x D (cm) 0.071 N/A N/A A (cm) : rea 0.007 N/A N/A Volume (cm) : 63.80% N/A N/A % Reduction in Area: 65.00% N/A N/A % Reduction in Volume: Grade 2 N/A N/A Classification: Small N/A N/A Exudate A mount: Serous N/A N/A Exudate Type: amber N/A N/A Exudate Color: Distinct, outline attached N/A N/A Wound Margin: Small (1-33%) N/A N/A Granulation A mount: Pink N/A N/A Granulation Quality: Large (67-100%) N/A N/A Necrotic A mount: Adherent Slough N/A N/A Necrotic Tissue: Fat Layer (Subcutaneous Tissue): Yes N/A N/A Exposed Structures: Fascia: No Tendon: No Muscle: No Joint: No Bone: No None N/A N/A Epithelialization: Compression Therapy N/A N/A Procedures Performed: Treatment Notes Wound #1 (Right, Posterior Lower Leg) 1. Cleanse With Wound Cleanser 3. Primary Dressing Applied Iodoflex 4. Secondary Dressing ABD Pad Dry Gauze 6. Support Layer Applied Kerlix/Coban Notes netting Wound #2 (Left, Anterior Lower Leg) 1. Cleanse With Wound Cleanser 3. Primary Dressing Applied Iodoflex 4. Secondary Dressing ABD Pad Dry Gauze 6. Support Layer Applied Kerlix/Coban Notes netting Wound #3 (Left, Proximal, Anterior Lower Leg) 1. Cleanse With Wound Cleanser 3. Primary Dressing Applied Iodoflex 4. Secondary Dressing ABD Pad Dry Gauze 6. Support Layer Applied Kerlix/Coban Notes netting Wound #4 (Left, Posterior Lower Leg) 1. Cleanse With Wound Cleanser 3. Primary Dressing Applied Iodoflex 4.  Secondary Dressing ABD Pad Dry Gauze 6. Support Layer Applied Kerlix/Coban Notes Horticulturist, commercial) Signed: 04/22/2020 5:35:40 PM By: [name] Coria RN Signed: 04/22/2020 5:35:49 PM By: Linton Ham MD Entered By: Linton Ham on 04/22/2020 15:16:07 -------------------------------------------------------------------------------- Multi-Disciplinary Care Plan Details Patient Name: Date of Service: [name] Mode, DO RIS 04/22/2020 1:45 PM Medical Record Number: [id number] Patient Account Number: [id number] Date of Birth/Sex: Treating RN: April 30, 1946 (74 y.o. [name] Primary Care Cris Gibby: [name] Other Clinician: Referring Baraa Tubbs: Treating Sabel Hornbeck/Extender: [name] in Treatment: 7 Active Inactive Wound/Skin Impairment Nursing Diagnoses: Impaired tissue integrity Knowledge deficit related to ulceration/compromised skin integrity Goals: Patient/caregiver will verbalize understanding of skin care regimen Date Initiated: 04/14/2020 Target Resolution Date: 05/02/2020 Goal Status: Active Interventions: Assess patient/caregiver ability to obtain necessary supplies Assess patient/caregiver ability to perform ulcer/skin care regimen upon admission and as needed Assess ulceration(s) every visit Provide education on ulcer and skin care Notes: Electronic Signature(s) Signed: 04/22/2020 5:35:40 PM By: [name] Coria RN Entered By: [name] on 04/22/2020 13:24:11 -------------------------------------------------------------------------------- Pain Assessment Details Patient Name: Date of Service: [name] Mode, DO RIS 04/22/2020 1:45 PM Medical Record Number: [id number] Patient Account Number: [id number] Date of Birth/Sex: Treating RN: 11/13/1945 (74 y.o. [name] Primary Care Izabel Chim: [name] Other Clinician: Referring Makesha Belitz: Treating Arneta Mahmood/Extender: [name] in  Treatment: 7 Active Problems Location of Pain Severity and Description of Pain Patient Has Paino Yes Site Locations Rate the pain. Current Pain Level: 8 Pain Management and Medication Current Pain Management: Electronic Signature(s) Signed: 04/22/2020 2:32:03 PM By: [name] Signed: 04/22/2020 5:35:40 PM By: [name] Coria RN Entered By: [name] on  04/22/2020 13:36:32 -------------------------------------------------------------------------------- Patient/Caregiver Education Details Patient Name: Date of Service: [place], DO RIS 10/5/2021andnbsp1:45 PM Medical Record Number: [id number] Patient Account Number: [id number] Date of Birth/Gender: Treating RN: 1945-11-06 (74 y.o. [name] Primary Care Physician: [name] Other Clinician: Referring Physician: Treating Physician/Extender: [name] in Treatment: 7 Education Assessment Education Provided To: Patient Education Topics Provided Wound/Skin Impairment: Methods: Explain/Verbal Responses: State content correctly Electronic Signature(s) Signed: 04/22/2020 5:35:40 PM By: [name] Coria RN Entered By: [name] on 04/22/2020 13:24:41 -------------------------------------------------------------------------------- Wound Assessment Details Patient Name: Date of Service: [name] Mode, DO RIS 04/22/2020 1:45 PM Medical Record Number: [id number] Patient Account Number: [id number] Date of Birth/Sex: Treating RN: 15-May-1946 (74 y.o. [name] Primary Care Everitt Wenner: [name] Other Clinician: Referring Isadore Palecek: Treating Lexx [name]/Extender: [name] in Treatment: 7 Wound Status Wound Number: 1 Primary Diabetic Wound/Ulcer of the Lower Extremity Etiology: Wound Location: Right, Posterior Lower Leg Wound Open Wounding Event: Gradually Appeared Status: Date Acquired: 01/17/2020 Comorbid Chronic Obstructive Pulmonary  Disease (COPD), Hypertension, Weeks Of Treatment: 7 History: Myocardial Infarction, Type II Diabetes Clustered Wound: No Wound Measurements Length: (cm) 0.5 Width: (cm) 0.7 Depth: (cm) 0.1 Area: (cm) 0.275 Volume: (cm) 0.027 % Reduction in Area: -40.3% % Reduction in Volume: 30.8% Epithelialization: None Tunneling: No Undermining: No Wound Description Classification: Grade 2 Wound Margin: Distinct, outline attached Exudate Amount: Small Exudate Type: Serous Exudate Color: amber Foul Odor After Cleansing: No Slough/Fibrino Yes Wound Bed Granulation Amount: None Present (0%) Exposed Structure Necrotic Amount: Large (67-100%) Fascia Exposed: No Necrotic Quality: Eschar, Adherent Slough Fat Layer (Subcutaneous Tissue) Exposed: No Tendon Exposed: No Muscle Exposed: No Joint Exposed: No Bone Exposed: No Treatment Notes Wound #1 (Right, Posterior Lower Leg) 1. Cleanse With Wound Cleanser 3. Primary Dressing Applied Iodoflex 4. Secondary Dressing ABD Pad Dry Gauze 6. Support Layer Applied Kerlix/Coban Notes Horticulturist, commercial) Signed: 04/22/2020 5:16:50 PM By: [name] Signed: 04/22/2020 5:35:40 PM By: [name] Coria RN Entered By: [name] on 04/22/2020 13:41:48 -------------------------------------------------------------------------------- Wound Assessment Details Patient Name: Date of Service: [name] [name] Monte, DO RIS 04/22/2020 1:45 PM Medical Record Number: [id number] Patient Account Number: [id number] Date of Birth/Sex: Treating RN: Dec 07, 1945 (74 y.o. [name] Primary Care Stiven Kaspar: [name] Other Clinician: Referring Joclynn Lumb: Treating Jacen Carlini/Extender: [name] in Treatment: 7 Wound Status Wound Number: 2 Primary Diabetic Wound/Ulcer of the Lower Extremity Etiology: Wound Location: Left, Anterior Lower Leg Wound Open Wounding Event: Gradually Appeared Status: Date Acquired:  01/17/2020 Comorbid Chronic Obstructive Pulmonary Disease (COPD), Hypertension, Weeks Of Treatment: 7 History: Myocardial Infarction, Type II Diabetes Clustered Wound: No Wound Measurements Length: (cm) 1.8 Width: (cm) 1.7 Depth: (cm) 0.2 Area: (cm) 2.403 Volume: (cm) 0.481 % Reduction in Area: -178.1% % Reduction in Volume: -459.3% Epithelialization: None Tunneling: No Undermining: No Wound Description Classification: Grade 2 Wound Margin: Distinct, outline attached Exudate Amount: Small Exudate Type: Serous Exudate Color: amber Foul Odor After Cleansing: No Slough/Fibrino Yes Wound Bed Granulation Amount: None Present (0%) Exposed Structure Necrotic Amount: Large (67-100%) Fascia Exposed: No Necrotic Quality: Eschar, Adherent Slough Fat Layer (Subcutaneous Tissue) Exposed: Yes Tendon Exposed: No Muscle Exposed: No Joint Exposed: No Bone Exposed: No Treatment Notes Wound #2 (Left, Anterior Lower Leg) 1. Cleanse With Wound Cleanser 3. Primary Dressing Applied Iodoflex 4. Secondary Dressing ABD Pad Dry Gauze 6. Support Layer Applied Kerlix/Coban Notes Horticulturist, commercial) Signed: 04/22/2020 5:16:50 PM By: [name] Signed:  04/22/2020 5:35:40 PM By: [name] Coria RN Entered By: [name] on 04/22/2020 13:43:48 -------------------------------------------------------------------------------- Wound Assessment Details Patient Name: Date of Service: [name] Mode, DO RIS 04/22/2020 1:45 PM Medical Record Number: [id number] Patient Account Number: [id number] Date of Birth/Sex: Treating RN: [birth year] (74 y.o. [name] Primary Care Sharnette Kitamura: [name] Other Clinician: Referring Taurus Willis: Treating Mkenzie Dotts/Extender: [name] in Treatment: 7 Wound Status Wound Number: 3 Primary Diabetic Wound/Ulcer of the Lower Extremity Etiology: Wound Location: Left, Proximal, Anterior Lower Leg Wound  Open Wounding Event: Gradually Appeared Status: Date Acquired: 04/07/2020 Comorbid Chronic Obstructive Pulmonary Disease (COPD), Hypertension, Weeks Of Treatment: 1 History: Myocardial Infarction, Type II Diabetes Clustered Wound: Yes Wound Measurements Length: (cm) Width: (cm) Depth: (cm) Clustered Quantity: Area: (cm) Volume: (cm) 4 % Reduction in Area: 15% 1.7 % Reduction in Volume: -70.1% 0.2 Epithelialization: None 3 Tunneling: No 5.341 Undermining: No 1.068 Wound Description Classification: Grade 2 Wound Margin: Distinct, outline attached Exudate Amount: Small Exudate Type: Serous Exudate Color: amber Foul Odor After Cleansing: No Slough/Fibrino Yes Wound Bed Granulation Amount: None Present (0%) Exposed Structure Necrotic Amount: Large (67-100%) Fascia Exposed: No Necrotic Quality: Eschar, Adherent Slough Fat Layer (Subcutaneous Tissue) Exposed: Yes Tendon Exposed: No Muscle Exposed: No Joint Exposed: No Bone Exposed: No Treatment Notes Wound #3 (Left, Proximal, Anterior Lower Leg) 1. Cleanse With Wound Cleanser 3. Primary Dressing Applied Iodoflex 4. Secondary Dressing ABD Pad Dry Gauze 6. Support Layer Applied Kerlix/Coban Notes Horticulturist, commercial) Signed: 04/22/2020 5:16:50 PM By: [name] Signed: 04/22/2020 5:35:40 PM By: [name] Coria RN Entered By: [name] on 04/22/2020 13:44:20 -------------------------------------------------------------------------------- Wound Assessment Details Patient Name: Date of Service: [name] [name] Monte, DO RIS 04/22/2020 1:45 PM Medical Record Number: [id number] Patient Account Number: [id number] Date of Birth/Sex: Treating RN: June 29, 1946 (74 y.o. [name] Primary Care Marishka Rentfrow: [name] Other Clinician: Referring Frenchie Pribyl: Treating Brisa Auth/Extender: [name] in Treatment: 7 Wound Status Wound Number: 4 Primary Diabetic Wound/Ulcer of  the Lower Extremity Etiology: Wound Location: Left, Posterior Lower Leg Wound Open Wounding Event: Gradually Appeared Status: Date Acquired: 04/07/2020 Comorbid Chronic Obstructive Pulmonary Disease (COPD), Hypertension, Weeks Of Treatment: 1 History: Myocardial Infarction, Type II Diabetes Clustered Wound: No Wound Measurements Length: (cm) 0.3 Width: (cm) 0.3 Depth: (cm) 0.1 Area: (cm) 0.071 Volume: (cm) 0.007 % Reduction in Area: 63.8% % Reduction in Volume: 65% Epithelialization: None Tunneling: No Undermining: No Wound Description Classification: Grade 2 Wound Margin: Distinct, outline attached Exudate Amount: Small Exudate Type: Serous Exudate Color: amber Foul Odor After Cleansing: No Slough/Fibrino Yes Wound Bed Granulation Amount: Small (1-33%) Exposed Structure Granulation Quality: Pink Fascia Exposed: No Necrotic Amount: Large (67-100%) Fat Layer (Subcutaneous Tissue) Exposed: Yes Necrotic Quality: Adherent Slough Tendon Exposed: No Muscle Exposed: No Joint Exposed: No Bone Exposed: No Treatment Notes Wound #4 (Left, Posterior Lower Leg) 1. Cleanse With Wound Cleanser 3. Primary Dressing Applied Iodoflex 4. Secondary Dressing ABD Pad Dry Gauze 6. Support Layer Applied Kerlix/Coban Notes Horticulturist, commercial) Signed: 04/22/2020 5:16:50 PM By: [name] Signed: 04/22/2020 5:35:40 PM By: [name] Coria RN Entered By: [name] on 04/22/2020 13:44:57 -------------------------------------------------------------------------------- Vitals Details Patient Name: Date of Service: [name] [name] Monte, DO RIS 04/22/2020 1:45 PM Medical Record Number: [id number] Patient Account Number: [id number] Date of Birth/Sex: Treating RN: 02/16/1946 (74 y.o. [name] Primary Care Sanye Ledesma: [name] Other Clinician: Referring Alexiz Cothran: Treating Chellsie Gomer/Extender: [name] in Treatment: 7 Vital  Signs Time Taken: 13:35 Temperature (F): 98.8 Height (in): 60 Pulse (bpm): 98 Weight (lbs): 160 Respiratory Rate (breaths/min): 19 Body Mass Index (BMI): 31.2 Blood Pressure (mmHg): 177/94 Capillary Blood Glucose (mg/dl): 324 Reference Range: 80 - 120 mg / dl Electronic Signature(s) Signed: 04/22/2020 2:32:03 PM By: [name] Entered By: [name] on 04/22/2020 13:36:25

## 2020-04-23 ENCOUNTER — Encounter: Payer: Medicare Other | Attending: Internal Medicine | Admitting: Registered"

## 2020-04-23 ENCOUNTER — Encounter: Payer: Self-pay | Admitting: Registered"

## 2020-04-23 ENCOUNTER — Other Ambulatory Visit: Payer: Self-pay

## 2020-04-23 DIAGNOSIS — E1122 Type 2 diabetes mellitus with diabetic chronic kidney disease: Secondary | ICD-10-CM | POA: Diagnosis not present

## 2020-04-23 DIAGNOSIS — Z794 Long term (current) use of insulin: Secondary | ICD-10-CM | POA: Insufficient documentation

## 2020-04-23 NOTE — Patient Instructions (Signed)
Consider taking your insulin as directed. Return for a follow visit to see how your blood sugar does after taking your long acting insulin daily. Consider changing your [place] Tea to the diet variety Consider having 1/2 cookie instead of the whole cookie with your milk. Ask your doctor if they want you to take insulin with your bedtime snack.

## 2020-04-23 NOTE — Progress Notes (Signed)
Diabetes Self-Management Education  Visit Type: First/Initial  Appt. Start Time: 1405 Appt. End Time: 1941  04/23/2020  [name], identified by name and date of birth, is a 74 y.o. female with a diagnosis of Diabetes: Type 2.   ASSESSMENT  There were no vitals taken for this visit. There is no height or weight on file to calculate BMI.   Patient is in a wheelchair and states her husband dropped her off at today's appointment and was going to get a wheelchair for her. Pt states she crawls at home to get up and down stairs and around.  Patient reports checking blood sugar 1x/day, fasting reading only. Pt states if the fasting number is in the low 100's she will not take the 60 units of long acting insulin. Pt reports she will still take 10 units of Humalog with breakfast.   Patient states she was confused by the instructions given by MD and didn't understand the instruction to increase by 1 unit with relationship to 100-130 mg/dL goal and how it relates to A1c.   Patient reports her FBS yesterday 324 mg/dL, patient states today her FBS was 101 mg/dL (reports no Toujeo taken).  Pt reports her grandmother baked cakes and pies. Patient states she really enjoys oatmeal raisin cookies.  Pt states she has started having more sugar free jello or fruit cups for treats.  Pt states she was instructed to consume 2 packets of Juven per day for wound healing. Pt states she may only do 1 packet because she does not like it.   Pt states she and her husband are raising their great grandson who is now 76 yrs old.    Diabetes Self-Management Education - 04/23/20 1431      Visit Information   Visit Type First/Initial      Initial Visit   Diabetes Type Type 2    Are you currently following a meal plan? No    Are you taking your medications as prescribed? No   toujeo 60 units am, humalog pen 10 units/meals   Date Diagnosed 4 yrs ago in [place]   How would you rate your  overall health? Poor      Psychosocial Assessment   Patient Belief/Attitude about Diabetes Afraid    How often do you need to have someone help you when you read instructions, pamphlets, or other written materials from your doctor or pharmacy? 3 - Sometimes    What is the last grade level you completed in school? tech & 2 yrs college      Complications   Last HgB A1C per patient/outside source 12.3 %   01/31/20   How often do you check your blood sugar? 1-2 times/day    Fasting Blood glucose range (mg/dL) 70-129;>200    Have you had a dilated eye exam in the past 12 months? No    Are you checking your feet? Yes    How many days per week are you checking your feet? 7      Dietary Intake   Breakfast ensure original    Snack (morning) peanut butter crackers or graham crackers    Lunch 1/2 of 6" subway ham & [place], oatmeal cookie, AZ tea    Dinner BLT    Snack (evening) milk, oatmeal cookie    Beverage(s) water, coffee with creamer and splenda, AZ tea (regular) OR diet lipton tea      Exercise   Exercise Type Light (  walking / raking leaves)    How many days per week to you exercise? 3    How many minutes per day do you exercise? 15    Total minutes per week of exercise 45      Patient Education   Previous Diabetes Education Yes (please comment)   2002   Nutrition management  Other (comment)   importance of limiting sweenetend beverages and cookies   Medications Reviewed patients medication for diabetes, action, purpose, timing of dose and side effects.    Monitoring Identified appropriate SMBG and/or A1C goals.    Acute complications Taught treatment of hypoglycemia - the 15 rule.      Individualized Goals (developed by patient)   Nutrition Other (comment)   reduce sweetened beverage and cookie intake   Medications take my medication as prescribed      Outcomes   Expected Outcomes Demonstrated interest in learning. Expect positive outcomes    Future DMSE 4-6 wks    Program  Status Not Completed           Individualized Plan for Diabetes Self-Management Training:   Learning Objective:  Patient will have a greater understanding of diabetes self-management. Patient education plan is to attend individual and/or group sessions per assessed needs and concerns.    Patient Instructions  Consider taking your insulin as directed. Return for a follow visit to see how your blood sugar does after taking your long acting insulin daily. Consider changing your AZ Tea to the diet variety Consider having 1/2 cookie instead of the whole cookie with your milk. Ask your doctor if they want you to take insulin with your bedtime snack.   Expected Outcomes:  Demonstrated interest in learning. Expect positive outcomes  Education material provided: A1C conversion sheet, used graph of long acting and short acting insulin with instructions if low to eat and still take insulin as instructed, as well as instructions for low blood sugar.   If problems or questions, patient to contact team via:  Phone  Future DSME appointment: 4-6 wks

## 2020-04-25 ENCOUNTER — Ambulatory Visit (INDEPENDENT_AMBULATORY_CARE_PROVIDER_SITE_OTHER): Payer: Medicare Other | Admitting: Cardiovascular Disease

## 2020-04-25 ENCOUNTER — Telehealth: Payer: Self-pay | Admitting: Cardiology

## 2020-04-25 DIAGNOSIS — I872 Venous insufficiency (chronic) (peripheral): Secondary | ICD-10-CM | POA: Diagnosis not present

## 2020-04-25 DIAGNOSIS — L97211 Non-pressure chronic ulcer of right calf limited to breakdown of skin: Secondary | ICD-10-CM | POA: Diagnosis not present

## 2020-04-25 DIAGNOSIS — I2510 Atherosclerotic heart disease of native coronary artery without angina pectoris: Secondary | ICD-10-CM | POA: Diagnosis not present

## 2020-04-25 DIAGNOSIS — Z5181 Encounter for therapeutic drug level monitoring: Secondary | ICD-10-CM

## 2020-04-25 DIAGNOSIS — L97221 Non-pressure chronic ulcer of left calf limited to breakdown of skin: Secondary | ICD-10-CM | POA: Diagnosis not present

## 2020-04-25 DIAGNOSIS — E1151 Type 2 diabetes mellitus with diabetic peripheral angiopathy without gangrene: Secondary | ICD-10-CM | POA: Diagnosis not present

## 2020-04-25 DIAGNOSIS — I480 Paroxysmal atrial fibrillation: Secondary | ICD-10-CM

## 2020-04-25 DIAGNOSIS — I152 Hypertension secondary to endocrine disorders: Secondary | ICD-10-CM | POA: Diagnosis not present

## 2020-04-25 LAB — POCT INR: INR: 1.5 — AB (ref 2.0–3.0)

## 2020-04-25 NOTE — Telephone Encounter (Signed)
Home Health Verbal Orders - Caller/Agency: Levada Dy with [facility] home health Callback Number: [phone]  Levada Dy called to report PT INR  INR  is 1.5 PT is 18.2

## 2020-04-28 ENCOUNTER — Other Ambulatory Visit: Payer: Self-pay

## 2020-04-28 DIAGNOSIS — L97211 Non-pressure chronic ulcer of right calf limited to breakdown of skin: Secondary | ICD-10-CM | POA: Diagnosis not present

## 2020-04-28 DIAGNOSIS — L97221 Non-pressure chronic ulcer of left calf limited to breakdown of skin: Secondary | ICD-10-CM | POA: Diagnosis not present

## 2020-04-28 DIAGNOSIS — I872 Venous insufficiency (chronic) (peripheral): Secondary | ICD-10-CM | POA: Diagnosis not present

## 2020-04-28 DIAGNOSIS — E1151 Type 2 diabetes mellitus with diabetic peripheral angiopathy without gangrene: Secondary | ICD-10-CM | POA: Diagnosis not present

## 2020-04-28 DIAGNOSIS — I152 Hypertension secondary to endocrine disorders: Secondary | ICD-10-CM | POA: Diagnosis not present

## 2020-04-28 DIAGNOSIS — I2510 Atherosclerotic heart disease of native coronary artery without angina pectoris: Secondary | ICD-10-CM | POA: Diagnosis not present

## 2020-04-28 NOTE — Patient Outreach (Signed)
[facility]) Care Management  04/28/2020  [name] [birth year] [id number]   Telephone assessment/ case closure:  Placed call to patient who identified herself. Reports she is self managing her DM and CHF well. Reports she is attending a DM class finally and that has helped. Reports CBG up and down but overall better. Reports she is now able to get back upstairs to her room and is able to walk some. Reports she continues to be well connected with the wound center and she is having home health do dressing changes and INR. Reports she continues to take her medications as prescribed and denies any problems with her medications.  Reports she continues to have significant leg pain and is hoping kidney function will allow testing for blood flow in her legs. She is in good spirits today. I reviewed her case and goals and patient has met her goals with [facility] program. She continues to work with wound clinic and home health for her legs. Patient agreed with case closure and will call me in the future for additional needs.  PLAN: close case as goals are met. Will send case closure letter to patient and MD.  Tomasa Rand, RN, BSN, CEN [place] Coordinator [phone]

## 2020-04-29 ENCOUNTER — Encounter (HOSPITAL_BASED_OUTPATIENT_CLINIC_OR_DEPARTMENT_OTHER): Payer: Medicare Other | Admitting: Internal Medicine

## 2020-04-29 DIAGNOSIS — H04123 Dry eye syndrome of bilateral lacrimal glands: Secondary | ICD-10-CM | POA: Diagnosis not present

## 2020-04-29 DIAGNOSIS — E113291 Type 2 diabetes mellitus with mild nonproliferative diabetic retinopathy without macular edema, right eye: Secondary | ICD-10-CM | POA: Diagnosis not present

## 2020-04-29 DIAGNOSIS — H2513 Age-related nuclear cataract, bilateral: Secondary | ICD-10-CM | POA: Diagnosis not present

## 2020-04-29 DIAGNOSIS — E119 Type 2 diabetes mellitus without complications: Secondary | ICD-10-CM | POA: Diagnosis not present

## 2020-04-29 DIAGNOSIS — H524 Presbyopia: Secondary | ICD-10-CM | POA: Diagnosis not present

## 2020-04-29 DIAGNOSIS — H34231 Retinal artery branch occlusion, right eye: Secondary | ICD-10-CM | POA: Diagnosis not present

## 2020-05-02 DIAGNOSIS — Z7901 Long term (current) use of anticoagulants: Secondary | ICD-10-CM | POA: Diagnosis not present

## 2020-05-02 DIAGNOSIS — I2510 Atherosclerotic heart disease of native coronary artery without angina pectoris: Secondary | ICD-10-CM | POA: Diagnosis not present

## 2020-05-02 DIAGNOSIS — I152 Hypertension secondary to endocrine disorders: Secondary | ICD-10-CM | POA: Diagnosis not present

## 2020-05-02 DIAGNOSIS — E1151 Type 2 diabetes mellitus with diabetic peripheral angiopathy without gangrene: Secondary | ICD-10-CM | POA: Diagnosis not present

## 2020-05-02 DIAGNOSIS — I872 Venous insufficiency (chronic) (peripheral): Secondary | ICD-10-CM | POA: Diagnosis not present

## 2020-05-02 DIAGNOSIS — L97211 Non-pressure chronic ulcer of right calf limited to breakdown of skin: Secondary | ICD-10-CM | POA: Diagnosis not present

## 2020-05-02 DIAGNOSIS — L97221 Non-pressure chronic ulcer of left calf limited to breakdown of skin: Secondary | ICD-10-CM | POA: Diagnosis not present

## 2020-05-02 DIAGNOSIS — Z952 Presence of prosthetic heart valve: Secondary | ICD-10-CM | POA: Diagnosis not present

## 2020-05-02 LAB — PROTIME-INR: INR: 2.1 — AB (ref 0.9–1.1)

## 2020-05-02 LAB — POCT INR: INR: 2.1 (ref 2.0–3.0)

## 2020-05-05 ENCOUNTER — Ambulatory Visit (INDEPENDENT_AMBULATORY_CARE_PROVIDER_SITE_OTHER): Payer: Medicare Other | Admitting: Cardiovascular Disease

## 2020-05-05 ENCOUNTER — Other Ambulatory Visit: Payer: Self-pay | Admitting: Internal Medicine

## 2020-05-05 ENCOUNTER — Telehealth: Payer: Self-pay | Admitting: Cardiology

## 2020-05-05 ENCOUNTER — Other Ambulatory Visit: Payer: Self-pay

## 2020-05-05 ENCOUNTER — Encounter (HOSPITAL_BASED_OUTPATIENT_CLINIC_OR_DEPARTMENT_OTHER): Payer: Medicare Other | Admitting: Internal Medicine

## 2020-05-05 DIAGNOSIS — Z7901 Long term (current) use of anticoagulants: Secondary | ICD-10-CM | POA: Diagnosis not present

## 2020-05-05 DIAGNOSIS — Z1231 Encounter for screening mammogram for malignant neoplasm of breast: Secondary | ICD-10-CM

## 2020-05-05 DIAGNOSIS — Z940 Kidney transplant status: Secondary | ICD-10-CM | POA: Diagnosis not present

## 2020-05-05 DIAGNOSIS — L97221 Non-pressure chronic ulcer of left calf limited to breakdown of skin: Secondary | ICD-10-CM | POA: Diagnosis not present

## 2020-05-05 DIAGNOSIS — J449 Chronic obstructive pulmonary disease, unspecified: Secondary | ICD-10-CM | POA: Diagnosis not present

## 2020-05-05 DIAGNOSIS — Z881 Allergy status to other antibiotic agents status: Secondary | ICD-10-CM | POA: Diagnosis not present

## 2020-05-05 DIAGNOSIS — I350 Nonrheumatic aortic (valve) stenosis: Secondary | ICD-10-CM | POA: Diagnosis not present

## 2020-05-05 DIAGNOSIS — E1151 Type 2 diabetes mellitus with diabetic peripheral angiopathy without gangrene: Secondary | ICD-10-CM | POA: Diagnosis not present

## 2020-05-05 DIAGNOSIS — I252 Old myocardial infarction: Secondary | ICD-10-CM | POA: Diagnosis not present

## 2020-05-05 DIAGNOSIS — Z85118 Personal history of other malignant neoplasm of bronchus and lung: Secondary | ICD-10-CM | POA: Diagnosis not present

## 2020-05-05 DIAGNOSIS — E1122 Type 2 diabetes mellitus with diabetic chronic kidney disease: Secondary | ICD-10-CM | POA: Diagnosis not present

## 2020-05-05 DIAGNOSIS — L97211 Non-pressure chronic ulcer of right calf limited to breakdown of skin: Secondary | ICD-10-CM | POA: Diagnosis not present

## 2020-05-05 DIAGNOSIS — Z923 Personal history of irradiation: Secondary | ICD-10-CM | POA: Diagnosis not present

## 2020-05-05 DIAGNOSIS — E11621 Type 2 diabetes mellitus with foot ulcer: Secondary | ICD-10-CM | POA: Diagnosis not present

## 2020-05-05 DIAGNOSIS — Z5181 Encounter for therapeutic drug level monitoring: Secondary | ICD-10-CM | POA: Diagnosis not present

## 2020-05-05 DIAGNOSIS — I480 Paroxysmal atrial fibrillation: Secondary | ICD-10-CM

## 2020-05-05 DIAGNOSIS — L97819 Non-pressure chronic ulcer of other part of right lower leg with unspecified severity: Secondary | ICD-10-CM | POA: Diagnosis not present

## 2020-05-05 DIAGNOSIS — L97828 Non-pressure chronic ulcer of other part of left lower leg with other specified severity: Secondary | ICD-10-CM | POA: Diagnosis not present

## 2020-05-05 DIAGNOSIS — N184 Chronic kidney disease, stage 4 (severe): Secondary | ICD-10-CM | POA: Diagnosis not present

## 2020-05-05 DIAGNOSIS — E11622 Type 2 diabetes mellitus with other skin ulcer: Secondary | ICD-10-CM | POA: Diagnosis not present

## 2020-05-05 DIAGNOSIS — L97218 Non-pressure chronic ulcer of right calf with other specified severity: Secondary | ICD-10-CM | POA: Diagnosis not present

## 2020-05-05 DIAGNOSIS — N189 Chronic kidney disease, unspecified: Secondary | ICD-10-CM | POA: Diagnosis not present

## 2020-05-05 DIAGNOSIS — Z794 Long term (current) use of insulin: Secondary | ICD-10-CM | POA: Diagnosis not present

## 2020-05-05 DIAGNOSIS — I5032 Chronic diastolic (congestive) heart failure: Secondary | ICD-10-CM | POA: Diagnosis not present

## 2020-05-05 DIAGNOSIS — Z6831 Body mass index (BMI) 31.0-31.9, adult: Secondary | ICD-10-CM | POA: Diagnosis not present

## 2020-05-05 DIAGNOSIS — I482 Chronic atrial fibrillation, unspecified: Secondary | ICD-10-CM | POA: Diagnosis not present

## 2020-05-05 DIAGNOSIS — G4733 Obstructive sleep apnea (adult) (pediatric): Secondary | ICD-10-CM | POA: Diagnosis not present

## 2020-05-05 DIAGNOSIS — I130 Hypertensive heart and chronic kidney disease with heart failure and stage 1 through stage 4 chronic kidney disease, or unspecified chronic kidney disease: Secondary | ICD-10-CM | POA: Diagnosis not present

## 2020-05-05 NOTE — Telephone Encounter (Signed)
Darlina Guys with [facility] is calling to report lab results. Please call.

## 2020-05-05 NOTE — Telephone Encounter (Signed)
Follow Up:     She had called earlier, she needs to know what to do please

## 2020-05-05 NOTE — Telephone Encounter (Signed)
Spoke to [facility] at # [phone].Patient's recent lab results will be faxed to our office at fax # [phone] for Dr.Crenshaw to review.

## 2020-05-06 ENCOUNTER — Ambulatory Visit: Payer: Medicare Other | Admitting: Endocrinology

## 2020-05-06 ENCOUNTER — Telehealth: Payer: Self-pay

## 2020-05-06 DIAGNOSIS — E119 Type 2 diabetes mellitus without complications: Secondary | ICD-10-CM | POA: Diagnosis not present

## 2020-05-06 DIAGNOSIS — R600 Localized edema: Secondary | ICD-10-CM | POA: Diagnosis not present

## 2020-05-06 DIAGNOSIS — Z940 Kidney transplant status: Secondary | ICD-10-CM | POA: Diagnosis not present

## 2020-05-06 DIAGNOSIS — I10 Essential (primary) hypertension: Secondary | ICD-10-CM | POA: Diagnosis not present

## 2020-05-06 DIAGNOSIS — R809 Proteinuria, unspecified: Secondary | ICD-10-CM | POA: Diagnosis not present

## 2020-05-06 DIAGNOSIS — N2581 Secondary hyperparathyroidism of renal origin: Secondary | ICD-10-CM | POA: Diagnosis not present

## 2020-05-06 DIAGNOSIS — D849 Immunodeficiency, unspecified: Secondary | ICD-10-CM | POA: Diagnosis not present

## 2020-05-06 NOTE — Progress Notes (Signed)
[name], [name] ([id number]) Visit Report for 05/05/2020 Arrival Information Details Patient Name: Date of Service: [name] RIS 05/05/2020 2:45 PM Medical Record Number: [id number] Patient Account Number: [id number] Date of Birth/Sex: Treating RN: October 12, 1945 (74 y.o. [name] Primary Care Matei Magnone: [name] Other Clinician: Referring Shyheim Tanney: Treating Demonie Kassa/Extender: [name] in Treatment: 9 Visit Information History Since Last Visit Added or deleted any medications: No Patient Arrived: Wheel Chair Any new allergies or adverse reactions: No Arrival Time: 15:25 Had a fall or experienced change in No Accompanied By: self activities of daily living that may affect Transfer Assistance: None risk of falls: Patient Identification Verified: Yes Signs or symptoms of abuse/neglect since last visito No Secondary Verification Process Completed: Yes Hospitalized since last visit: No Patient Has Alerts: Yes Implantable device outside of the clinic excluding No Patient Alerts: Patient on Blood Thinner cellular tissue based products placed in the center unable to obtain ABI on R since last visit: Has Dressing in Place as Prescribed: Yes Pain Present Now: Yes Electronic Signature(s) Signed: 05/06/2020 11:49:38 AM By: Kela Millin Entered By: Kela Millin on 05/05/2020 15:27:30 -------------------------------------------------------------------------------- Clinic Level of Care Assessment Details Patient Name: Date of Service: [name] RIS 05/05/2020 2:45 PM Medical Record Number: [id number] Patient Account Number: [id number] Date of Birth/Sex: Treating RN: 10-28-1945 (74 y.o. [name] Primary Care Kahliya Fraleigh: [name] Other Clinician: Referring Godson Pollan: Treating Refujio Haymer/Extender: [name] in Treatment: 9 Clinic Level of Care Assessment Items TOOL 4  Quantity Score X- 1 0 Use when only an EandM is performed on FOLLOW-UP visit ASSESSMENTS - Nursing Assessment / Reassessment X- 1 10 Reassessment of Co-morbidities (includes updates in patient status) X- 1 5 Reassessment of Adherence to Treatment Plan ASSESSMENTS - Wound and Skin A ssessment / Reassessment []  - 0 Simple Wound Assessment / Reassessment - one wound X- 4 5 Complex Wound Assessment / Reassessment - multiple wounds []  - 0 Dermatologic / Skin Assessment (not related to wound area) ASSESSMENTS - Focused Assessment []  - 0 Circumferential Edema Measurements - multi extremities []  - 0 Nutritional Assessment / Counseling / Intervention X- 1 5 Lower Extremity Assessment (monofilament, tuning fork, pulses) []  - 0 Peripheral Arterial Disease Assessment (using hand held doppler) ASSESSMENTS - Ostomy and/or Continence Assessment and Care []  - 0 Incontinence Assessment and Management []  - 0 Ostomy Care Assessment and Management (repouching, etc.) PROCESS - Coordination of Care X - Simple Patient / Family Education for ongoing care 1 15 []  - 0 Complex (extensive) Patient / Family Education for ongoing care X- 1 10 Staff obtains Consents, Records, T Results / Process Orders est X- 1 10 Staff telephones HHA, Nursing Homes / Clarify orders / etc []  - 0 Routine Transfer to another Facility (non-emergent condition) []  - 0 Routine Hospital Admission (non-emergent condition) []  - 0 New Admissions / Biomedical engineer / Ordering NPWT Apligraf, etc. , []  - 0 Emergency Hospital Admission (emergent condition) X- 1 10 Simple Discharge Coordination []  - 0 Complex (extensive) Discharge Coordination PROCESS - Special Needs []  - 0 Pediatric / Minor Patient Management []  - 0 Isolation Patient Management []  - 0 Hearing / Language / Visual special needs []  - 0 Assessment of Community assistance (transportation, D/C planning, etc.) []  - 0 Additional assistance / Altered  mentation []  - 0 Support Surface(s) Assessment (bed, cushion, seat, etc.) INTERVENTIONS - Wound Cleansing / Measurement []  - 0 Simple Wound Cleansing - one wound  X- 4 5 Complex Wound Cleansing - multiple wounds X- 1 5 Wound Imaging (photographs - any number of wounds) []  - 0 Wound Tracing (instead of photographs) []  - 0 Simple Wound Measurement - one wound X- 4 5 Complex Wound Measurement - multiple wounds INTERVENTIONS - Wound Dressings []  - 0 Small Wound Dressing one or multiple wounds []  - 0 Medium Wound Dressing one or multiple wounds X- 2 20 Large Wound Dressing one or multiple wounds []  - 0 Application of Medications - topical []  - 0 Application of Medications - injection INTERVENTIONS - Miscellaneous []  - 0 External ear exam []  - 0 Specimen Collection (cultures, biopsies, blood, body fluids, etc.) []  - 0 Specimen(s) / Culture(s) sent or taken to Lab for analysis []  - 0 Patient Transfer (multiple staff / Civil Service fast streamer / Similar devices) []  - 0 Simple Staple / Suture removal (25 or less) []  - 0 Complex Staple / Suture removal (26 or more) []  - 0 Hypo / Hyperglycemic Management (close monitor of Blood Glucose) []  - 0 Ankle / Brachial Index (ABI) - do not check if billed separately X- 1 5 Vital Signs Has the patient been seen at the hospital within the last three years: Yes Total Score: 175 Level Of Care: New/Established - Level 5 Electronic Signature(s) Signed: 05/05/2020 5:42:40 PM By: Levan Hurst RN, BSN Entered By: Levan Hurst on 05/05/2020 17:25:04 -------------------------------------------------------------------------------- Encounter Discharge Information Details Patient Name: Date of Service: [name], DO RIS 05/05/2020 2:45 PM Medical Record Number: [id number] Patient Account Number: [id number] Date of Birth/Sex: Treating RN: [birth year] (74 y.o. [name] Primary Care Cal Gindlesperger: [name] Other Clinician: Referring  Kalyna Paolella: Treating Rileyann Florance/Extender: [name] in Treatment: 9 Encounter Discharge Information Items Discharge Condition: Stable Ambulatory Status: Wheelchair Discharge Destination: Home Transportation: Private Auto Accompanied By: self Schedule Follow-up Appointment: Yes Clinical Summary of Care: Patient Declined Electronic Signature(s) Signed: 05/06/2020 11:49:38 AM By: Kela Millin Entered By: Kela Millin on 05/05/2020 16:23:50 -------------------------------------------------------------------------------- Lower Extremity Assessment Details Patient Name: Date of Service: [name], DO RIS 05/05/2020 2:45 PM Medical Record Number: [id number] Patient Account Number: [id number] Date of Birth/Sex: Treating RN: June 03, 1946 (74 y.o. [name] Primary Care Bell Cai: [name] Other Clinician: Referring Yamili Lichtenwalner: Treating Lamin Chandley/Extender: [name] in Treatment: 9 Edema Assessment Assessed: Shirlyn Goltz: No] [Right: No] Edema: [Left: Yes] [Right: Yes] Calf Left: Right: Point of Measurement: 38 cm From Medial Instep 34 cm 37 cm Ankle Left: Right: Point of Measurement: 9 cm From Medial Instep 21 cm 22 cm Vascular Assessment Pulses: Dorsalis Pedis Palpable: [Left:No] [Right:No] Electronic Signature(s) Signed: 05/06/2020 11:49:38 AM By: Kela Millin Entered By: Kela Millin on 05/05/2020 15:39:03 -------------------------------------------------------------------------------- Multi Wound Chart Details Patient Name: Date of Service: [name], DO RIS 05/05/2020 2:45 PM Medical Record Number: [id number] Patient Account Number: [id number] Date of Birth/Sex: Treating RN: 1945/10/04 (74 y.o. [name] Primary Care Marika Mahaffy: [name] Other Clinician: Referring Berlene Dixson: Treating Ameliarose Shark/Extender: [name] in Treatment:  9 Vital Signs Height(in): 60 Capillary Blood Glucose(mg/dl): 228 Weight(lbs): 160 Pulse(bpm): 14 Body Mass Index(BMI): 31 Blood Pressure(mmHg): 155/77 Temperature(F): 98.9 Respiratory Rate(breaths/min): 20 Photos: [1:No Photos Right, Posterior Lower Leg] [2:No Photos Left, Anterior Lower Leg] [3:No Photos Left, Proximal, Anterior Lower Leg] Wound Location: [1:Gradually Appeared] [2:Gradually Appeared] [3:Gradually Appeared] Wounding Event: [1:Diabetic Wound/Ulcer of the Lower] [2:Diabetic Wound/Ulcer of the Lower] [3:Diabetic Wound/Ulcer of the Lower] Primary Etiology: [1:Extremity Chronic Obstructive Pulmonary] [  2:Extremity Chronic Obstructive Pulmonary] [3:Extremity Chronic Obstructive Pulmonary] Comorbid History: [1:Disease (COPD), Hypertension, Myocardial Infarction, Type II Diabetes Myocardial Infarction, Type II Diabetes Myocardial Infarction, Type II Diabetes 01/17/2020] [2:Disease (COPD), Hypertension, 01/17/2020] [3:Disease (COPD),  Hypertension, 04/07/2020] Date Acquired: [1:9] [2:9] [3:3] Weeks of Treatment: [1:Open] [2:Open] [3:Open] Wound Status: [1:No] [2:No] [3:Yes] Clustered Wound: [1:N/A] [2:N/A] [3:3] Clustered Quantity: [1:0.5x0.5x0.1] [2:1.7x1.8x0.2] [3:3.5x1.7x0.2] Measurements L x W x D (cm) [1:0.196] [2:2.403] [3:4.673] A (cm) : rea [1:0.02] [2:0.481] [3:0.935] Volume (cm) : [1:0.00%] [2:-178.10%] [3:25.60%] % Reduction in A rea: [1:48.70%] [2:-459.30%] [3:-48.90%] % Reduction in Volume: [1:Grade 2] [2:Grade 2] [3:Grade 2] Classification: [1:Small] [2:Small] [3:Small] Exudate A mount: [1:Serous] [2:Serous] [3:Serous] Exudate Type: [1:amber] [2:amber] [3:amber] Exudate Color: [1:Distinct, outline attached] [2:Distinct, outline attached] [3:Distinct, outline attached] Wound Margin: [1:None Present (0%)] [2:None Present (0%)] [3:None Present (0%)] Granulation A mount: [1:N/A] [2:N/A] [3:N/A] Granulation Quality: [1:Large (67-100%)] [2:Large (67-100%)] [3:Large  (67-100%)] Necrotic A mount: [1:Eschar, Adherent Slough] [2:Eschar, Adherent Slough] [3:Eschar, Adherent Slough] Necrotic Tissue: [1:Fascia: No] [2:Fat Layer (Subcutaneous Tissue): Yes Fat Layer (Subcutaneous Tissue): Yes] Exposed Structures: [1:Fat Layer (Subcutaneous Tissue): No Fascia: No Tendon: No Muscle: No Joint: No Bone: No None] [2:Tendon: No Muscle: No Joint: No Bone: No None] [3:Fascia: No Tendon: No Muscle: No Joint: No Bone: No None] Wound Number: 4 N/A N/A Photos: No Photos N/A N/A Left, Posterior Lower Leg N/A N/A Wound Location: Gradually Appeared N/A N/A Wounding Event: Diabetic Wound/Ulcer of the Lower N/A N/A Primary Etiology: Extremity Chronic Obstructive Pulmonary N/A N/A Comorbid History: Disease (COPD), Hypertension, Myocardial Infarction, Type II Diabetes 04/07/2020 N/A N/A Date Acquired: 3 N/A N/A Weeks of Treatment: Open N/A N/A Wound Status: No N/A N/A Clustered Wound: N/A N/A N/A Clustered Quantity: 0.3x0.4x0.1 N/A N/A Measurements L x W x D (cm) 0.094 N/A N/A A (cm) : rea 0.009 N/A N/A Volume (cm) : 52.00% N/A N/A % Reduction in A rea: 55.00% N/A N/A % Reduction in Volume: Grade 2 N/A N/A Classification: Small N/A N/A Exudate A mount: Serous N/A N/A Exudate Type: amber N/A N/A Exudate Color: Distinct, outline attached N/A N/A Wound Margin: Small (1-33%) N/A N/A Granulation A mount: Pink N/A N/A Granulation Quality: Large (67-100%) N/A N/A Necrotic A mount: Adherent Slough N/A N/A Necrotic Tissue: Fat Layer (Subcutaneous Tissue): Yes N/A N/A Exposed Structures: Fascia: No Tendon: No Muscle: No Joint: No Bone: No None N/A N/A Epithelialization: Treatment Notes Wound #1 (Right, Posterior Lower Leg) 1. Cleanse With Wound Cleanser Soap and water 2. Periwound Care Moisturizing lotion 3. Primary Dressing Applied Iodoflex 4. Secondary Dressing ABD Pad Dry Gauze Kerramax/Xtrasorb 6. Support Layer  Applied Kerlix/Coban Notes netting Wound #2 (Left, Anterior Lower Leg) 1. Cleanse With Wound Cleanser Soap and water 2. Periwound Care Moisturizing lotion 3. Primary Dressing Applied Iodoflex 4. Secondary Dressing ABD Pad Dry Gauze Kerramax/Xtrasorb 6. Support Layer Applied Kerlix/Coban Notes netting Wound #3 (Left, Proximal, Anterior Lower Leg) 1. Cleanse With Wound Cleanser Soap and water 2. Periwound Care Moisturizing lotion 3. Primary Dressing Applied Iodoflex 4. Secondary Dressing ABD Pad Dry Gauze Kerramax/Xtrasorb 6. Support Layer Applied Kerlix/Coban Notes netting Wound #4 (Left, Posterior Lower Leg) 1. Cleanse With Wound Cleanser Soap and water 2. Periwound Care Moisturizing lotion 3. Primary Dressing Applied Iodoflex 4. Secondary Dressing ABD Pad Dry Gauze Kerramax/Xtrasorb 6. Support Layer Applied Kerlix/Coban Notes Horticulturist, commercial) Signed: 05/05/2020 5:42:40 PM By: Levan Hurst RN, BSN Signed: 05/06/2020 4:22:08 PM By: Linton Ham MD Entered By: Linton Ham on 05/05/2020 16:49:27 -------------------------------------------------------------------------------- [place]  Details Patient Name: Date of Service: [name] RIS 05/05/2020 2:45 PM Medical Record Number: [id number] Patient Account Number: [id number] Date of Birth/Sex: Treating RN: [birth year] (74 y.o. [name] Primary Care Margueritte Guthridge: [name] Other Clinician: Referring Kallee Nam: Treating Yee Joss/Extender: [name] in Treatment: 9 Active Inactive Wound/Skin Impairment Nursing Diagnoses: Impaired tissue integrity Knowledge deficit related to ulceration/compromised skin integrity Goals: Patient/caregiver will verbalize understanding of skin care regimen Date Initiated: 04/14/2020 Target Resolution Date: 06/06/2020 Goal Status: Active Interventions: Assess patient/caregiver  ability to obtain necessary supplies Assess patient/caregiver ability to perform ulcer/skin care regimen upon admission and as needed Assess ulceration(s) every visit Provide education on ulcer and skin care Notes: Electronic Signature(s) Signed: 05/05/2020 5:42:40 PM By: Levan Hurst RN, BSN Entered By: Levan Hurst on 05/05/2020 17:24:20 -------------------------------------------------------------------------------- Pain Assessment Details Patient Name: Date of Service: [name], DO RIS 05/05/2020 2:45 PM Medical Record Number: [id number] Patient Account Number: [id number] Date of Birth/Sex: Treating RN: [birth year] (74 y.o. [name] Primary Care Jakwan Sally: [name] Other Clinician: Referring Josette Shimabukuro: Treating Saide Lanuza/Extender: [name] in Treatment: 9 Active Problems Location of Pain Severity and Description of Pain Patient Has Paino Yes Site Locations Pain Location: Pain in Ulcers With Dressing Change: Yes Duration of the Pain. Constant / Intermittento Constant Rate the pain. Current Pain Level: 6 Worst Pain Level: 10 Least Pain Level: 5 Tolerable Pain Level: 5 Character of Pain Describe the Pain: Aching, Burning Pain Management and Medication Current Pain Management: Electronic Signature(s) Signed: 05/06/2020 11:49:38 AM By: Kela Millin Entered By: Kela Millin on 05/05/2020 15:27:00 -------------------------------------------------------------------------------- Patient/Caregiver Education Details Patient Name: Date of Service: [name], DO RIS 10/18/2021andnbsp2:45 PM Medical Record Number: [id number] Patient Account Number: [id number] Date of Birth/Gender: Treating RN: [birth year] (74 y.o. [name] Primary Care Physician: [name] Other Clinician: Referring Physician: Treating Physician/Extender: [name] in Treatment:  9 Education Assessment Education Provided To: Patient Education Topics Provided Wound/Skin Impairment: Methods: Explain/Verbal Responses: State content correctly [employer]) Signed: 05/05/2020 5:42:40 PM By: Levan Hurst RN, BSN Entered By: Levan Hurst on 05/05/2020 17:24:30 -------------------------------------------------------------------------------- Wound Assessment Details Patient Name: Date of Service: [name], DO RIS 05/05/2020 2:45 PM Medical Record Number: [id number] Patient Account Number: [id number] Date of Birth/Sex: Treating RN: 04-Jan-1946 (74 y.o. [name] Primary Care Laryn Venning: [name] Other Clinician: Referring Slyvia Lartigue: Treating Rishit Burkhalter/Extender: [name] in Treatment: 9 Wound Status Wound Number: 1 Primary Diabetic Wound/Ulcer of the Lower Extremity Etiology: Wound Location: Right, Posterior Lower Leg Wound Open Wounding Event: Gradually Appeared Status: Date Acquired: 01/17/2020 Comorbid Chronic Obstructive Pulmonary Disease (COPD), Hypertension, Weeks Of Treatment: 9 History: Myocardial Infarction, Type II Diabetes Clustered Wound: No Wound Measurements Length: (cm) 0.5 Width: (cm) 0.5 Depth: (cm) 0.1 Area: (cm) 0.196 Volume: (cm) 0.02 % Reduction in Area: 0% % Reduction in Volume: 48.7% Epithelialization: None Tunneling: No Undermining: No Wound Description Classification: Grade 2 Wound Margin: Distinct, outline attached Exudate Amount: Small Exudate Type: Serous Exudate Color: amber Foul Odor After Cleansing: No Slough/Fibrino Yes Wound Bed Granulation Amount: None Present (0%) Exposed Structure Necrotic Amount: Large (67-100%) Fascia Exposed: No Necrotic Quality: Eschar, Adherent Slough Fat Layer (Subcutaneous Tissue) Exposed: No Tendon Exposed: No Muscle Exposed: No Joint Exposed: No Bone Exposed: No Treatment Notes Wound #1 (Right, Posterior Lower  Leg) 1. Cleanse With Wound Cleanser Soap and water 2. Periwound Care Moisturizing lotion 3.  Primary Dressing Applied Iodoflex 4. Secondary Dressing ABD Pad Dry Gauze Kerramax/Xtrasorb 6. Support Layer Applied Kerlix/Coban Notes Horticulturist, commercial) Signed: 05/05/2020 5:42:40 PM By: Levan Hurst RN, BSN Signed: 05/06/2020 11:49:38 AM By: Kela Millin Entered By: Kela Millin on 05/05/2020 15:39:30 -------------------------------------------------------------------------------- Wound Assessment Details Patient Name: Date of Service: [name], DO RIS 05/05/2020 2:45 PM Medical Record Number: [id number] Patient Account Number: [id number] Date of Birth/Sex: Treating RN: 1946/03/03 (74 y.o. [name] Primary Care Raisa Ditto: [name] Other Clinician: Referring Fynley Chrystal: Treating Dequandre Cordova/Extender: [name] in Treatment: 9 Wound Status Wound Number: 2 Primary Diabetic Wound/Ulcer of the Lower Extremity Etiology: Wound Location: Left, Anterior Lower Leg Wound Open Wounding Event: Gradually Appeared Status: Date Acquired: 01/17/2020 Comorbid Chronic Obstructive Pulmonary Disease (COPD), Hypertension, Weeks Of Treatment: 9 History: Myocardial Infarction, Type II Diabetes Clustered Wound: No Wound Measurements Length: (cm) 1.7 Width: (cm) 1.8 Depth: (cm) 0.2 Area: (cm) 2.403 Volume: (cm) 0.481 % Reduction in Area: -178.1% % Reduction in Volume: -459.3% Epithelialization: None Tunneling: No Undermining: No Wound Description Classification: Grade 2 Wound Margin: Distinct, outline attached Exudate Amount: Small Exudate Type: Serous Exudate Color: amber Foul Odor After Cleansing: No Slough/Fibrino Yes Wound Bed Granulation Amount: None Present (0%) Exposed Structure Necrotic Amount: Large (67-100%) Fascia Exposed: No Necrotic Quality: Eschar, Adherent Slough Fat Layer (Subcutaneous Tissue)  Exposed: Yes Tendon Exposed: No Muscle Exposed: No Joint Exposed: No Bone Exposed: No Treatment Notes Wound #2 (Left, Anterior Lower Leg) 1. Cleanse With Wound Cleanser Soap and water 2. Periwound Care Moisturizing lotion 3. Primary Dressing Applied Iodoflex 4. Secondary Dressing ABD Pad Dry Gauze Kerramax/Xtrasorb 6. Support Layer Applied Kerlix/Coban Notes Horticulturist, commercial) Signed: 05/05/2020 5:42:40 PM By: Levan Hurst RN, BSN Signed: 05/06/2020 11:49:38 AM By: Kela Millin Entered By: Kela Millin on 05/05/2020 15:39:55 -------------------------------------------------------------------------------- Wound Assessment Details Patient Name: Date of Service: [name], DO RIS 05/05/2020 2:45 PM Medical Record Number: [id number] Patient Account Number: [id number] Date of Birth/Sex: Treating RN: May 05, 1946 (74 y.o. [name] Primary Care Keola Heninger: [name] Other Clinician: Referring Sephiroth Mcluckie: Treating Kaoir Loree/Extender: [name] in Treatment: 9 Wound Status Wound Number: 3 Primary Diabetic Wound/Ulcer of the Lower Extremity Etiology: Wound Location: Left, Proximal, Anterior Lower Leg Wound Open Wounding Event: Gradually Appeared Status: Date Acquired: 04/07/2020 Comorbid Chronic Obstructive Pulmonary Disease (COPD), Hypertension, Weeks Of Treatment: 3 History: Myocardial Infarction, Type II Diabetes Clustered Wound: Yes Wound Measurements Length: (cm) Width: (cm) Depth: (cm) Clustered Quantity: Area: (cm) Volume: (cm) 3.5 % Reduction in Area: 25.6% 1.7 % Reduction in Volume: -48.9% 0.2 Epithelialization: None 3 Tunneling: No 4.673 Undermining: No 0.935 Wound Description Classification: Grade 2 Wound Margin: Distinct, outline attached Exudate Amount: Small Exudate Type: Serous Exudate Color: amber Foul Odor After Cleansing: No Slough/Fibrino Yes Wound Bed Granulation  Amount: None Present (0%) Exposed Structure Necrotic Amount: Large (67-100%) Fascia Exposed: No Necrotic Quality: Eschar, Adherent Slough Fat Layer (Subcutaneous Tissue) Exposed: Yes Tendon Exposed: No Muscle Exposed: No Joint Exposed: No Bone Exposed: No Treatment Notes Wound #3 (Left, Proximal, Anterior Lower Leg) 1. Cleanse With Wound Cleanser Soap and water 2. Periwound Care Moisturizing lotion 3. Primary Dressing Applied Iodoflex 4. Secondary Dressing ABD Pad Dry Gauze Kerramax/Xtrasorb 6. Support Layer Applied Kerlix/Coban Notes Horticulturist, commercial) Signed: 05/05/2020 5:42:40 PM By: Levan Hurst RN, BSN Signed: 05/06/2020 11:49:38 AM By: Kela Millin Entered By: Kela Millin on 05/05/2020 15:40:28 -------------------------------------------------------------------------------- Wound Assessment Details Patient Name: Date of Service:  [name], DO RIS 05/05/2020 2:45 PM Medical Record Number: [id number] Patient Account Number: [id number] Date of Birth/Sex: Treating RN: [birth year] (74 y.o. [name] Primary Care Ledger Heindl: [name] Other Clinician: Referring Deloria Brassfield: Treating Reyna Lorenzi/Extender: [name] in Treatment: 9 Wound Status Wound Number: 4 Primary Diabetic Wound/Ulcer of the Lower Extremity Etiology: Wound Location: Left, Posterior Lower Leg Wound Open Wounding Event: Gradually Appeared Status: Date Acquired: 04/07/2020 Comorbid Chronic Obstructive Pulmonary Disease (COPD), Hypertension, Weeks Of Treatment: 3 History: Myocardial Infarction, Type II Diabetes Clustered Wound: No Wound Measurements Length: (cm) 0.3 Width: (cm) 0.4 Depth: (cm) 0.1 Area: (cm) 0.094 Volume: (cm) 0.009 % Reduction in Area: 52% % Reduction in Volume: 55% Epithelialization: None Tunneling: No Undermining: No Wound Description Classification: Grade 2 Wound Margin: Distinct, outline  attached Exudate Amount: Small Exudate Type: Serous Exudate Color: amber Foul Odor After Cleansing: No Slough/Fibrino Yes Wound Bed Granulation Amount: Small (1-33%) Exposed Structure Granulation Quality: Pink Fascia Exposed: No Necrotic Amount: Large (67-100%) Fat Layer (Subcutaneous Tissue) Exposed: Yes Necrotic Quality: Adherent Slough Tendon Exposed: No Muscle Exposed: No Joint Exposed: No Bone Exposed: No Treatment Notes Wound #4 (Left, Posterior Lower Leg) 1. Cleanse With Wound Cleanser Soap and water 2. Periwound Care Moisturizing lotion 3. Primary Dressing Applied Iodoflex 4. Secondary Dressing ABD Pad Dry Gauze Kerramax/Xtrasorb 6. Support Layer Applied Kerlix/Coban Notes Horticulturist, commercial) Signed: 05/05/2020 5:42:40 PM By: Levan Hurst RN, BSN Signed: 05/06/2020 11:49:38 AM By: Kela Millin Previous Signature: 05/05/2020 3:40:54 PM Version By: Sandre Kitty Entered By: Kela Millin on 05/05/2020 15:41:02 -------------------------------------------------------------------------------- Vitals Details Patient Name: Date of Service: [name], DO RIS 05/05/2020 2:45 PM Medical Record Number: [id number] Patient Account Number: [id number] Date of Birth/Sex: Treating RN: 25-Mar-1946 (74 y.o. [name] Primary Care Raha Tennison: [name] Other Clinician: Referring Jayona Mccaig: Treating Tru Rana/Extender: [name] in Treatment: 9 Vital Signs Time Taken: 15:25 Temperature (F): 98.9 Height (in): 60 Pulse (bpm): 62 Weight (lbs): 160 Respiratory Rate (breaths/min): 20 Body Mass Index (BMI): 31.2 Blood Pressure (mmHg): 155/77 Capillary Blood Glucose (mg/dl): 228 Reference Range: 80 - 120 mg / dl Notes patient stated CBG this morning was 228 Electronic Signature(s) Signed: 05/06/2020 11:49:38 AM By: Kela Millin Entered By: Kela Millin on 05/05/2020 15:26:26

## 2020-05-06 NOTE — Telephone Encounter (Signed)
Kecia calling back.

## 2020-05-06 NOTE — Progress Notes (Signed)
ASAKO, SALIBA ([id number]) Visit Report for 05/05/2020 HPI Details Patient Name: Date of Service: [name] RIS 05/05/2020 2:45 PM Medical Record Number: [id number] Patient Account Number: [id number] Date of Birth/Sex: Treating RN: [birth year] (74 y.o. [name] Primary Care Provider: [name] Other Clinician: Referring Provider: Treating Provider/Extender: [name] in Treatment: 9 History of Present Illness HPI Description: 74 year old female with bilateral leg wounds one on the left anterior leg and 1 right posterior leg for about 6 weeks, lately she has noted excessive drainage that soaks towels at night by the time she wakes up in the morning, especially from the left shin wound. Patient stated this started out as pinholes and became bigger. She had a couple 1 on the left and one on the right more distal near the feet close up on their own. She has been using Neosporin and clobetasol cream to the area and using gauze dressing. ABIs today right unobtainable, left 0.8, Interestingly her ABIs in 2018 were 0.47 on the right and 0.71 on the left, Performed by Dr. Earleen Newport Patient's history is complex with post renal transplant and kidney disease stage IV, history of lung cancer status post XRT insulin requiring diabetes, chronic A. , fib status post Maze procedure, diastolic heart failure, A. fib on Coumadin, aortic stenosis status post AVR, OSA without CPAP use. Patient denies any fevers chills shakes, has significant degree of pain in the shin area and the wound and has difficulty having any pressure on it 8/27; patient was admitted to clinic on 8/13. She has punched out areas on the left lateral and right lateral. Neither 1 of these have viable surfaces. We used silver alginate on them when she first came in she has 3+ pitting edema bilaterally in her lower extremities and some degree of PAD. We ordered arterial studies on her last time I  do not know that those have been done. The patient complains of pain in the legs stating the wraps were too tight. She has home health I think changing the dressings. She says she was in the ER on Monday because of pain. She has chronic kidney disease stage IV and is on very high doses of torsemide Addendum; after patient left the clinic were able to resurrect her arterial studies from 8/23. This showed on the right and ABI 1.58 on IV noncompressible waveforms were monophasic great toe waveform was absent. On the left ABI at 1.58 again great toe waveform was absent. Monophasic waveforms 9/27; this is a patient that was admitted to the clinic in August by Dr. Evette Doffing. I saw her once on 8/27. She has ischemic wounds on her bilateral lower extremities. Her noninvasive studies were quoted above. She saw Dr. [name] on 04/10/2020 am not exactly sure who made this appointment. She tells [place] that she is in a lot of pain 9-1/2 out of 10 can only walk minimal distances at home. She has severe chronic renal failure status post renal transplant. She has a shunt in her left arm. She also has chronic atrial fibrillation on Coumadin. Dr. [name] felt she had slowly healing wounds I am not exactly sure these are healing. Dr. [name] felt that any attempt at imaging her would put her into dialysis range chronic renal failure which is probably accurate.. It was she was not felt to be a candidate for an open operation but might be a percutaneous revascularization. She has 1 on the right lateral lower leg and area on the  left lateral. These are punched out ischemic looking. She had a new painful swelling on the left anterior. I unroofed some eschar on this to expose copious amounts of pus which I have obtained for culture. She has been on Keflex I think that it Dr. [name] actually prescribed and I have continued this today pending culture of the abscess. She is allergic to Bactrim and doxycycline 10/5; the patient is not  doing well. The small abscess that I noted last week on the left anterior lower leg just above her original wound grew Enterobacter cloacae I. This was resistant to cefazolin I therefore have her on cefdinir. Is today with a larger necrotic wound on her left anterior tibia where I cultured last time the other wounds on the left lower anterior tibia and the right lateral calf are about the same. Nonviable surface I think the patient is really in a lot of pain. I think this is claudication at rest for the most part. She is still not really willing to take the risk of an angiogram until her kidney function improves although I am not really sure where we are with that she follows with nephrology as well. She does have a shunt in her left arm The last lab work I see was in late August. At which time her creatinine was 2.90 estimated GFR of 17. However her glucose at that time was 700. She says she is hoping that her creatinine comes down/improves. I am not sure where we are with that. I cannot see her nephrologist notes 10/18; 2-week follow-up. The patient is not doing well. 2 necrotic wounds on the left anterior tibia. And a small punched-out area on the left posterior calf. We are using Iodoflex to the wound areas. She is having a lot of pain I think this is claudication. She sees her nephrologist tomorrow she was reluctant to even consider angiography until she talk to Dr. Joelyn Oms who is her nephrologist. I think she has a follow-up appointment with Dr. [name] later this month on 10/29 Electronic Signature(s) Signed: 05/06/2020 4:22:08 PM By: Linton Ham MD Entered By: Linton Ham on 05/05/2020 16:53:06 -------------------------------------------------------------------------------- Physical Exam Details Patient Name: Date of Service: [name] Mode, DO RIS 05/05/2020 2:45 PM Medical Record Number: [id number] Patient Account Number: [id number] Date of Birth/Sex: Treating RN: [birth year] (74 y.o. [name] Primary Care Provider: [name] Other Clinician: Referring Provider: Treating Provider/Extender: [name] in Treatment: 9 Constitutional Patient is hypertensive.. Pulse regular and within target range for patient.Marland Kitchen Respirations regular, non-labored and within target range.. Temperature is normal and within the target range for the patient.Marland Kitchen Appears in no distress. Cardiovascular Pedal pulses absent bilaterally.. Musculoskeletal She pointed out a swelling on the medial part of the right first MTP. She has a remote history of gout and this is certainly what this could be. Very tender. Integumentary (Hair, Skin) No infection in either area. Notes Wound exam; left anterior tibia. 2 punched out necrotic wounds. These do not look healthy. Nevertheless I am reluctant to consider debridement until we can sort through her arterial status. She has a smaller area on the right lateral calf. Electronic Signature(s) Signed: 05/06/2020 4:22:08 PM By: Linton Ham MD Entered By: Linton Ham on 05/05/2020 16:56:39 -------------------------------------------------------------------------------- Physician Orders Details Patient Name: Date of Service: [name] [name], DO RIS 05/05/2020 2:45 PM Medical Record Number: [id number] Patient Account Number: [id number] Date of Birth/Sex: Treating RN: [birth year] (74 y.o. 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 Dressing Change Frequency Other: - twice a week by home health Wound Cleansing Clean wound with Normal Saline. - or wound cleanser May shower with protection. - cast protector Primary Wound Dressing Wound #1 Right,Posterior Lower Leg Iodoflex Wound #2 Left,Anterior Lower Leg Iodoflex Wound #3 Left,Proximal,Anterior Lower Leg Iodoflex Wound #4 Left,Posterior Lower Leg Iodoflex Secondary Dressing ABD pad Zetuvit or Kerramax - or other super absorbent pad Edema Control Kerlix and Coban - Bilateral - ****LIGHTLY WRAP**** Avoid standing for long periods of time Elevate legs to the level of the heart or above for 30 minutes daily and/or when sitting, a frequency of: - throughout the day [place] skilled nursing for wound care. - [facility] Electronic Signature(s) Signed: 05/05/2020 5:42:40 PM By: Levan Hurst RN, BSN Signed: 05/06/2020 4:22:08 PM By: Linton Ham MD Entered By: Levan Hurst on 05/05/2020 16:18:11 -------------------------------------------------------------------------------- Problem List Details Patient Name: Date of Service: [name] [name], DO RIS 05/05/2020 2:45 PM Medical Record Number: [id number] Patient Account Number: [id number] Date of Birth/Sex: Treating RN: 12-19-1945 (74 y.o. [name] Primary Care Provider: [name] Other Clinician: Referring Provider: Treating Provider/Extender: [name] in Treatment: 9 Active Problems ICD-10 Encounter Code Description Active Date MDM Diagnosis L97.211 Non-pressure chronic ulcer of right calf limited to breakdown of skin 02/29/2020 No Yes L97.828 Non-pressure chronic ulcer of other part of left lower leg with other specified 05/05/2020 No Yes severity N18.4 Chronic kidney disease, stage 4 (severe) 02/29/2020 No Yes E11.22 Type 2  diabetes mellitus with diabetic chronic kidney disease 02/29/2020 No Yes L02.416 Cutaneous abscess of left lower limb 04/14/2020 No Yes L97.218 Non-pressure chronic ulcer of right calf with other specified severity 05/05/2020 No Yes Inactive Problems ICD-10 Code Description Active Date Inactive Date U44.03 Chronic diastolic (congestive) heart failure 02/29/2020 02/29/2020 L97.221 Non-pressure chronic ulcer of left calf limited to breakdown of skin 02/29/2020 02/29/2020 Resolved Problems Electronic Signature(s) Signed: 05/06/2020 4:22:08 PM By: Linton Ham MD Entered By: Linton Ham on 05/05/2020 16:49:17 -------------------------------------------------------------------------------- Progress Note Details Patient Name: Date of Service: [name] Mode, DO RIS 05/05/2020 2:45 PM Medical Record Number: [id number] Patient Account Number: [id number] Date of Birth/Sex: Treating RN: May 05, 1946 (74 y.o. [name] Primary Care Provider: [name] Other Clinician: Referring Provider: Treating Provider/Extender: [name] in Treatment: 9 Subjective History of Present Illness (HPI) 74 year old female with bilateral leg wounds one on the left anterior leg and 1 right posterior leg for about 6 weeks, lately she has noted excessive drainage that soaks towels at night by the time she wakes up in the morning, especially from the left shin wound. Patient stated this started out as pinholes and became bigger. She had a couple 1 on the left and one on the right more distal near the feet close up on their own. She has been using Neosporin and clobetasol cream to the area and using gauze dressing. ABIs today right unobtainable, left 0.8, Interestingly her ABIs in  2018 were 0.47 on the right and 0.71 on the left, Performed by Dr. Earleen Newport Patient's history is complex with post renal transplant and kidney disease stage IV, history of lung cancer status post XRT  insulin requiring diabetes, chronic A. , fib status post Maze procedure, diastolic heart failure, A. fib on Coumadin, aortic stenosis status post AVR, OSA without CPAP use. Patient denies any fevers chills shakes, has significant degree of pain in the shin area and the wound and has difficulty having any pressure on it 8/27; patient was admitted to clinic on 8/13. She has punched out areas on the left lateral and right lateral. Neither 1 of these have viable surfaces. We used silver alginate on them when she first came in she has 3+ pitting edema bilaterally in her lower extremities and some degree of PAD. We ordered arterial studies on her last time I do not know that those have been done. The patient complains of pain in the legs stating the wraps were too tight. She has home health I think changing the dressings. She says she was in the ER on Monday because of pain. She has chronic kidney disease stage IV and is on very high doses of torsemide Addendum; after patient left the clinic were able to resurrect her arterial studies from 8/23. This showed on the right and ABI 1.58 on IV noncompressible waveforms were monophasic great toe waveform was absent. On the left ABI at 1.58 again great toe waveform was absent. Monophasic waveforms 9/27; this is a patient that was admitted to the clinic in August by Dr. Evette Doffing. I saw her once on 8/27. She has ischemic wounds on her bilateral lower extremities. Her noninvasive studies were quoted above. She saw Dr. [name] on 04/10/2020 am not exactly sure who made this appointment. She tells [place] that she is in a lot of pain 9-1/2 out of 10 can only walk minimal distances at home. She has severe chronic renal failure status post renal transplant. She has a shunt in her left arm. She also has chronic atrial fibrillation on Coumadin. Dr. [name] felt she had slowly healing wounds I am not exactly sure these are healing. Dr. [name] felt that any attempt at imaging her  would put her into dialysis range chronic renal failure which is probably accurate.. It was she was not felt to be a candidate for an open operation but might be a percutaneous revascularization. She has 1 on the right lateral lower leg and area on the left lateral. These are punched out ischemic looking. She had a new painful swelling on the left anterior. I unroofed some eschar on this to expose copious amounts of pus which I have obtained for culture. She has been on Keflex I think that it Dr. [name] actually prescribed and I have continued this today pending culture of the abscess. She is allergic to Bactrim and doxycycline 10/5; the patient is not doing well. The small abscess that I noted last week on the left anterior lower leg just above her original wound grew Enterobacter cloacae I. This was resistant to cefazolin I therefore have her on cefdinir. Is today with a larger necrotic wound on her left anterior tibia where I cultured last time the other wounds on the left lower anterior tibia and the right lateral calf are about the same. Nonviable surface I think the patient is really in a lot of pain. I think this is claudication at rest for the most part. She is still not really  willing to take the risk of an angiogram until her kidney function improves although I am not really sure where we are with that she follows with nephrology as well. She does have a shunt in her left arm The last lab work I see was in late August. At which time her creatinine was 2.90 estimated GFR of 17. However her glucose at that time was 700. She says she is hoping that her creatinine comes down/improves. I am not sure where we are with that. I cannot see her nephrologist notes 10/18; 2-week follow-up. The patient is not doing well. 2 necrotic wounds on the left anterior tibia. And a small punched-out area on the left posterior calf. We are using Iodoflex to the wound areas. She is having a lot of pain I think this  is claudication. She sees her nephrologist tomorrow she was reluctant to even consider angiography until she talk to Dr. Joelyn Oms who is her nephrologist. I think she has a follow-up appointment with Dr. [name] later this month on 10/29 Objective Constitutional Patient is hypertensive.. Pulse regular and within target range for patient.Marland Kitchen Respirations regular, non-labored and within target range.. Temperature is normal and within the target range for the patient.Marland Kitchen Appears in no distress. Vitals Time Taken: 3:25 PM, Height: 60 in, Weight: 160 lbs, BMI: 31.2, Temperature: 98.9 F, Pulse: 62 bpm, Respiratory Rate: 20 breaths/min, Blood Pressure: 155/77 mmHg, Capillary Blood Glucose: 228 mg/dl. General Notes: patient stated CBG this morning was 228 Cardiovascular Pedal pulses absent bilaterally.. General Notes: Wound exam; left anterior tibia. 2 punched out necrotic wounds. These do not look healthy. Nevertheless I am reluctant to consider debridement until we can sort through her arterial status. ooShe has a smaller area on the right lateral calf. Integumentary (Hair, Skin) No infection in either area. Wound #1 status is Open. Original cause of wound was Gradually Appeared. The wound is located on the Right,Posterior Lower Leg. The wound measures 0.5cm length x 0.5cm width x 0.1cm depth; 0.196cm^2 area and 0.02cm^3 volume. There is no tunneling or undermining noted. There is a small amount of serous drainage noted. The wound margin is distinct with the outline attached to the wound base. There is no granulation within the wound bed. There is a large (67-100%) amount of necrotic tissue within the wound bed including Eschar and Adherent Slough. Wound #2 status is Open. Original cause of wound was Gradually Appeared. The wound is located on the Left,Anterior Lower Leg. The wound measures 1.7cm length x 1.8cm width x 0.2cm depth; 2.403cm^2 area and 0.481cm^3 volume. There is Fat Layer (Subcutaneous  Tissue) exposed. There is no tunneling or undermining noted. There is a small amount of serous drainage noted. The wound margin is distinct with the outline attached to the wound base. There is no granulation within the wound bed. There is a large (67-100%) amount of necrotic tissue within the wound bed including Eschar and Adherent Slough. Wound #3 status is Open. Original cause of wound was Gradually Appeared. The wound is located on the Left,Proximal,Anterior Lower Leg. The wound measures 3.5cm length x 1.7cm width x 0.2cm depth; 4.673cm^2 area and 0.935cm^3 volume. There is Fat Layer (Subcutaneous Tissue) exposed. There is no tunneling or undermining noted. There is a small amount of serous drainage noted. The wound margin is distinct with the outline attached to the wound base. There is no granulation within the wound bed. There is a large (67-100%) amount of necrotic tissue within the wound bed including Eschar and Adherent Slough.  Wound #4 status is Open. Original cause of wound was Gradually Appeared. The wound is located on the Left,Posterior Lower Leg. The wound measures 0.3cm length x 0.4cm width x 0.1cm depth; 0.094cm^2 area and 0.009cm^3 volume. There is Fat Layer (Subcutaneous Tissue) exposed. There is no tunneling or undermining noted. There is a small amount of serous drainage noted. The wound margin is distinct with the outline attached to the wound base. There is small (1-33%) pink granulation within the wound bed. There is a large (67-100%) amount of necrotic tissue within the wound bed including Adherent Slough. Assessment Active Problems ICD-10 Non-pressure chronic ulcer of right calf limited to breakdown of skin Non-pressure chronic ulcer of other part of left lower leg with other specified severity Chronic kidney disease, stage 4 (severe) Type 2 diabetes mellitus with diabetic chronic kidney disease Cutaneous abscess of left lower limb Non-pressure chronic ulcer of right  calf with other specified severity Plan Follow-up Appointments: Return Appointment in 2 weeks. Dressing Change Frequency: Other: - twice a week by home health Wound Cleansing: Clean wound with Normal Saline. - or wound cleanser May shower with protection. - cast protector Primary Wound Dressing: Wound #1 Right,Posterior Lower Leg: Iodoflex Wound #2 Left,Anterior Lower Leg: Iodoflex Wound #3 Left,Proximal,Anterior Lower Leg: Iodoflex Wound #4 Left,Posterior Lower Leg: Iodoflex Secondary Dressing: ABD pad Zetuvit or Kerramax - or other super absorbent pad Edema Control: Kerlix and Coban - Bilateral - ****LIGHTLY WRAP**** Avoid standing for long periods of time Elevate legs to the level of the heart or above for 30 minutes daily and/or when sitting, a frequency of: - throughout the day Home Health: [facility] skilled nursing for wound care. - [facility] 1. We are using Iodoflex. 2. She sees Dr. Joelyn Oms tomorrow hopefully to have her creatinine rechecked 3. She is on prednisone for some form of glomerulonephritis. 4. She is in a lot of pain I think this is ischemic pain. 5. We are controlling her edema with Kerlix and Coban we sent we certainly cannot go more aggressive than 6. She very well could have gout in the right first MTP. She is already on prednisone certainly not a candidate for NSAIDs. I know there is some concern about colchicine and kidney toxicity. I have asked her to speak to her nephrologist tomorrow about this. Electronic Signature(s) Signed: 05/06/2020 4:22:08 PM By: Linton Ham MD Entered By: Linton Ham on 05/05/2020 16:57:22 -------------------------------------------------------------------------------- SuperBill Details Patient Name: Date of Service: [name] Mode, DO RIS 05/05/2020 Medical Record Number: [id number] Patient Account Number: [id number] Date of Birth/Sex: Treating RN: 10/26/1945 (74 y.o. [name] Primary Care Provider:  [name] Other Clinician: Referring Provider: Treating Provider/Extender: [name] in Treatment: 9 Diagnosis Coding ICD-10 Codes Code Description [phone] Non-pressure chronic ulcer of right calf limited to breakdown of skin L97.828 Non-pressure chronic ulcer of other part of left lower leg with other specified severity N18.4 Chronic kidney disease, stage 4 (severe) E11.22 Type 2 diabetes mellitus with diabetic chronic kidney disease L02.416 Cutaneous abscess of left lower limb L97.218 Non-pressure chronic ulcer of right calf with other specified severity Facility Procedures CPT4 Code: [id number] Description: 40102 - WOUND CARE VISIT-LEV 5 EST PT Modifier: Quantity: 1 Physician Procedures : CPT4 Code Description Modifier [id number] - WC PHYS LEVEL 3 - EST PT ICD-10 Diagnosis Description L97.211 Non-pressure chronic ulcer of right calf limited to breakdown of skin L97.828 Non-pressure chronic ulcer of other part of left lower leg with  other specified  severity N18.4 Chronic kidney disease, stage 4 (severe) L97.218 Non-pressure chronic ulcer of right calf with other specified severity Quantity: 1 Electronic Signature(s) Signed: 05/05/2020 5:42:40 PM By: Levan Hurst RN, BSN Signed: 05/06/2020 4:22:08 PM By: Linton Ham MD Entered By: Levan Hurst on 05/05/2020 17:25:14

## 2020-05-06 NOTE — Telephone Encounter (Signed)
Spoke with Darlina Guys, patients protime is 21.2 and her INR is 2.7. she would like a call back today with instructions. Will forward to pharm, md.

## 2020-05-06 NOTE — Telephone Encounter (Signed)
Please see anti-coagulation note 

## 2020-05-09 ENCOUNTER — Ambulatory Visit (INDEPENDENT_AMBULATORY_CARE_PROVIDER_SITE_OTHER): Payer: Medicare Other | Admitting: Pharmacist Clinician (PhC)/ Clinical Pharmacy Specialist

## 2020-05-09 DIAGNOSIS — I480 Paroxysmal atrial fibrillation: Secondary | ICD-10-CM | POA: Diagnosis not present

## 2020-05-09 DIAGNOSIS — I2510 Atherosclerotic heart disease of native coronary artery without angina pectoris: Secondary | ICD-10-CM | POA: Diagnosis not present

## 2020-05-09 DIAGNOSIS — L97221 Non-pressure chronic ulcer of left calf limited to breakdown of skin: Secondary | ICD-10-CM | POA: Diagnosis not present

## 2020-05-09 DIAGNOSIS — Z5181 Encounter for therapeutic drug level monitoring: Secondary | ICD-10-CM

## 2020-05-09 DIAGNOSIS — E1151 Type 2 diabetes mellitus with diabetic peripheral angiopathy without gangrene: Secondary | ICD-10-CM | POA: Diagnosis not present

## 2020-05-09 DIAGNOSIS — I152 Hypertension secondary to endocrine disorders: Secondary | ICD-10-CM | POA: Diagnosis not present

## 2020-05-09 DIAGNOSIS — I872 Venous insufficiency (chronic) (peripheral): Secondary | ICD-10-CM | POA: Diagnosis not present

## 2020-05-09 DIAGNOSIS — L97211 Non-pressure chronic ulcer of right calf limited to breakdown of skin: Secondary | ICD-10-CM | POA: Diagnosis not present

## 2020-05-09 LAB — POCT INR: INR: 1.9 — AB (ref 2.0–3.0)

## 2020-05-14 DIAGNOSIS — H1132 Conjunctival hemorrhage, left eye: Secondary | ICD-10-CM | POA: Diagnosis not present

## 2020-05-14 DIAGNOSIS — Z23 Encounter for immunization: Secondary | ICD-10-CM | POA: Diagnosis not present

## 2020-05-15 DIAGNOSIS — K5730 Diverticulosis of large intestine without perforation or abscess without bleeding: Secondary | ICD-10-CM | POA: Diagnosis not present

## 2020-05-15 DIAGNOSIS — N186 End stage renal disease: Secondary | ICD-10-CM | POA: Diagnosis not present

## 2020-05-15 DIAGNOSIS — I872 Venous insufficiency (chronic) (peripheral): Secondary | ICD-10-CM | POA: Diagnosis not present

## 2020-05-15 DIAGNOSIS — E1122 Type 2 diabetes mellitus with diabetic chronic kidney disease: Secondary | ICD-10-CM | POA: Diagnosis not present

## 2020-05-15 DIAGNOSIS — I152 Hypertension secondary to endocrine disorders: Secondary | ICD-10-CM | POA: Diagnosis not present

## 2020-05-15 DIAGNOSIS — K8020 Calculus of gallbladder without cholecystitis without obstruction: Secondary | ICD-10-CM | POA: Diagnosis not present

## 2020-05-15 DIAGNOSIS — D509 Iron deficiency anemia, unspecified: Secondary | ICD-10-CM | POA: Diagnosis not present

## 2020-05-15 DIAGNOSIS — L97221 Non-pressure chronic ulcer of left calf limited to breakdown of skin: Secondary | ICD-10-CM | POA: Diagnosis not present

## 2020-05-15 DIAGNOSIS — I5032 Chronic diastolic (congestive) heart failure: Secondary | ICD-10-CM | POA: Diagnosis not present

## 2020-05-15 DIAGNOSIS — G4730 Sleep apnea, unspecified: Secondary | ICD-10-CM | POA: Diagnosis not present

## 2020-05-15 DIAGNOSIS — M1030 Gout due to renal impairment, unspecified site: Secondary | ICD-10-CM | POA: Diagnosis not present

## 2020-05-15 DIAGNOSIS — I4819 Other persistent atrial fibrillation: Secondary | ICD-10-CM | POA: Diagnosis not present

## 2020-05-15 DIAGNOSIS — E1151 Type 2 diabetes mellitus with diabetic peripheral angiopathy without gangrene: Secondary | ICD-10-CM | POA: Diagnosis not present

## 2020-05-15 DIAGNOSIS — I2510 Atherosclerotic heart disease of native coronary artery without angina pectoris: Secondary | ICD-10-CM | POA: Diagnosis not present

## 2020-05-15 DIAGNOSIS — J9611 Chronic respiratory failure with hypoxia: Secondary | ICD-10-CM | POA: Diagnosis not present

## 2020-05-15 DIAGNOSIS — H9190 Unspecified hearing loss, unspecified ear: Secondary | ICD-10-CM | POA: Diagnosis not present

## 2020-05-15 DIAGNOSIS — K2100 Gastro-esophageal reflux disease with esophagitis, without bleeding: Secondary | ICD-10-CM | POA: Diagnosis not present

## 2020-05-15 DIAGNOSIS — C3411 Malignant neoplasm of upper lobe, right bronchus or lung: Secondary | ICD-10-CM | POA: Diagnosis not present

## 2020-05-15 DIAGNOSIS — J449 Chronic obstructive pulmonary disease, unspecified: Secondary | ICD-10-CM | POA: Diagnosis not present

## 2020-05-15 DIAGNOSIS — E785 Hyperlipidemia, unspecified: Secondary | ICD-10-CM | POA: Diagnosis not present

## 2020-05-15 DIAGNOSIS — Z7901 Long term (current) use of anticoagulants: Secondary | ICD-10-CM | POA: Diagnosis not present

## 2020-05-15 DIAGNOSIS — Z992 Dependence on renal dialysis: Secondary | ICD-10-CM | POA: Diagnosis not present

## 2020-05-15 DIAGNOSIS — Z87891 Personal history of nicotine dependence: Secondary | ICD-10-CM | POA: Diagnosis not present

## 2020-05-15 DIAGNOSIS — Z940 Kidney transplant status: Secondary | ICD-10-CM | POA: Diagnosis not present

## 2020-05-15 DIAGNOSIS — L97211 Non-pressure chronic ulcer of right calf limited to breakdown of skin: Secondary | ICD-10-CM | POA: Diagnosis not present

## 2020-05-16 ENCOUNTER — Ambulatory Visit (INDEPENDENT_AMBULATORY_CARE_PROVIDER_SITE_OTHER): Payer: Medicare Other | Admitting: Internal Medicine

## 2020-05-16 DIAGNOSIS — I152 Hypertension secondary to endocrine disorders: Secondary | ICD-10-CM | POA: Diagnosis not present

## 2020-05-16 DIAGNOSIS — I480 Paroxysmal atrial fibrillation: Secondary | ICD-10-CM

## 2020-05-16 DIAGNOSIS — I872 Venous insufficiency (chronic) (peripheral): Secondary | ICD-10-CM | POA: Diagnosis not present

## 2020-05-16 DIAGNOSIS — Z5181 Encounter for therapeutic drug level monitoring: Secondary | ICD-10-CM | POA: Diagnosis not present

## 2020-05-16 DIAGNOSIS — L97211 Non-pressure chronic ulcer of right calf limited to breakdown of skin: Secondary | ICD-10-CM | POA: Diagnosis not present

## 2020-05-16 DIAGNOSIS — L97221 Non-pressure chronic ulcer of left calf limited to breakdown of skin: Secondary | ICD-10-CM | POA: Diagnosis not present

## 2020-05-16 DIAGNOSIS — E1151 Type 2 diabetes mellitus with diabetic peripheral angiopathy without gangrene: Secondary | ICD-10-CM | POA: Diagnosis not present

## 2020-05-16 DIAGNOSIS — I2510 Atherosclerotic heart disease of native coronary artery without angina pectoris: Secondary | ICD-10-CM | POA: Diagnosis not present

## 2020-05-16 LAB — POCT INR: INR: 1.4 — AB (ref 2.0–3.0)

## 2020-05-19 DIAGNOSIS — E1151 Type 2 diabetes mellitus with diabetic peripheral angiopathy without gangrene: Secondary | ICD-10-CM | POA: Diagnosis not present

## 2020-05-19 DIAGNOSIS — I2510 Atherosclerotic heart disease of native coronary artery without angina pectoris: Secondary | ICD-10-CM | POA: Diagnosis not present

## 2020-05-19 DIAGNOSIS — I872 Venous insufficiency (chronic) (peripheral): Secondary | ICD-10-CM | POA: Diagnosis not present

## 2020-05-19 DIAGNOSIS — L97211 Non-pressure chronic ulcer of right calf limited to breakdown of skin: Secondary | ICD-10-CM | POA: Diagnosis not present

## 2020-05-19 DIAGNOSIS — L97221 Non-pressure chronic ulcer of left calf limited to breakdown of skin: Secondary | ICD-10-CM | POA: Diagnosis not present

## 2020-05-19 DIAGNOSIS — I152 Hypertension secondary to endocrine disorders: Secondary | ICD-10-CM | POA: Diagnosis not present

## 2020-05-19 NOTE — Telephone Encounter (Signed)
Discussed results with patient

## 2020-05-21 ENCOUNTER — Encounter: Payer: Self-pay | Admitting: Registered"

## 2020-05-21 ENCOUNTER — Encounter: Payer: Medicare Other | Attending: Internal Medicine | Admitting: Registered"

## 2020-05-21 ENCOUNTER — Other Ambulatory Visit: Payer: Self-pay

## 2020-05-21 DIAGNOSIS — Z794 Long term (current) use of insulin: Secondary | ICD-10-CM | POA: Diagnosis not present

## 2020-05-21 DIAGNOSIS — E1122 Type 2 diabetes mellitus with diabetic chronic kidney disease: Secondary | ICD-10-CM | POA: Insufficient documentation

## 2020-05-21 NOTE — Patient Instructions (Addendum)
Good job on cutting out oatmeal cookies  Include in your grocery list:  [place] Yogurt Some greens (turnip greens) Cabbage Asparagus  When making your soup continue to include protein, tomatoes, okra, (cut back the amount of rice you add to your soup)  Sugar-free pudding or jello are fine for a dessert, better choice than cake.  When having spaghetti for dinner, Have a big salad, Use a measuring cup and have 1/2 cup spaghetti, meatballs, and 1 piece of bread OR 1 piece of fruit.

## 2020-05-21 NOTE — Progress Notes (Signed)
Diabetes Self-Management Education  Visit Type: Follow-up  Appt. Start Time: 1505 Appt. End Time: 6948  05/21/2020  [name], identified by name and date of birth, is a 74 y.o. female with a diagnosis of Diabetes: Type 2.   ASSESSMENT  There were no vitals taken for this visit. There is no height or weight on file to calculate BMI.   Patient continues to check FBS only. Some recent readings are 205, 256, 138,180, and in the 300s. RD updated the date and time on her meter during visit.  Patient states she now takes Toujeo 1x/day regardless of blood sugar and 10 units of Humalog with meals.  Patient states she stopped eating oatmeal raisin cookies. Pt states her husband cooks dinner for them and always includes dessert. Pt states he tries to get her to eat things like ice cream. Pt states she tries to have sugar free jello, pudding or fruit cups. Pt reports she probably had had a piece of pineapple cake to make her blood sugar 398 mg/dL on 05/17/20 (fasting number). Pt states her husband is skinny and can eat anything he wants.   GFR 17 lab value 01/31/20. Pt states her kidney doctor told her not to drink Boost anymore because it was too high in protein. RD plans to discuss plant protein next visit   Diabetes Self-Management Education - 05/21/20 1521      Visit Information   Visit Type Follow-up      Initial Visit   Diabetes Type Type 2      Patient Education   Nutrition management  Meal options for control of blood glucose level and chronic complications.      Outcomes   Expected Outcomes Demonstrated interest in learning. Expect positive outcomes    Future DMSE 2 months    Program Status Not Completed      Subsequent Visit   Since your last visit have you continued or begun to take your medications as prescribed? --   still takes 60 u of Toujeo and 10 units humalog with meals.   Since your last visit, are you checking your blood glucose at least once a day? Yes   FBS:  130-398          Individualized Plan for Diabetes Self-Management Training:   Learning Objective:  Patient will have a greater understanding of diabetes self-management. Patient education plan is to attend individual and/or group sessions per assessed needs and concerns.    Patient Instructions  Good job on cutting out oatmeal cookies  Include in your grocery list:  [place] Yogurt Some greens (turnip greens) Cabbage Asparagus  When making your soup continue to include protein, tomatoes, okra, (cut back the amount of rice you add to your soup)  Sugar-free pudding or jello are fine for a dessert, better choice than cake.  When having spaghetti for dinner, Have a big salad, Use a measuring cup and have 1/2 cup spaghetti, meatballs, and 1 piece of bread OR 1 piece of fruit.    Expected Outcomes:  Demonstrated interest in learning. Expect positive outcomes  Education material provided: none  If problems or questions, patient to contact team via:  Phone  Future DSME appointment: 2 months

## 2020-05-22 ENCOUNTER — Other Ambulatory Visit: Payer: Self-pay

## 2020-05-22 ENCOUNTER — Ambulatory Visit (INDEPENDENT_AMBULATORY_CARE_PROVIDER_SITE_OTHER): Payer: Medicare Other | Admitting: Vascular Surgery

## 2020-05-22 ENCOUNTER — Encounter: Payer: Self-pay | Admitting: Vascular Surgery

## 2020-05-22 ENCOUNTER — Encounter (HOSPITAL_BASED_OUTPATIENT_CLINIC_OR_DEPARTMENT_OTHER): Payer: Medicare Other | Attending: Internal Medicine | Admitting: Internal Medicine

## 2020-05-22 VITALS — BP 136/58 | HR 52 | Temp 97.4°F | Resp 20 | Ht 60.0 in | Wt 160.0 lb

## 2020-05-22 DIAGNOSIS — N184 Chronic kidney disease, stage 4 (severe): Secondary | ICD-10-CM | POA: Diagnosis not present

## 2020-05-22 DIAGNOSIS — L97212 Non-pressure chronic ulcer of right calf with fat layer exposed: Secondary | ICD-10-CM | POA: Diagnosis not present

## 2020-05-22 DIAGNOSIS — I2510 Atherosclerotic heart disease of native coronary artery without angina pectoris: Secondary | ICD-10-CM

## 2020-05-22 DIAGNOSIS — L97211 Non-pressure chronic ulcer of right calf limited to breakdown of skin: Secondary | ICD-10-CM | POA: Diagnosis not present

## 2020-05-22 DIAGNOSIS — Z940 Kidney transplant status: Secondary | ICD-10-CM | POA: Insufficient documentation

## 2020-05-22 DIAGNOSIS — Z7901 Long term (current) use of anticoagulants: Secondary | ICD-10-CM | POA: Diagnosis not present

## 2020-05-22 DIAGNOSIS — Z881 Allergy status to other antibiotic agents status: Secondary | ICD-10-CM | POA: Insufficient documentation

## 2020-05-22 DIAGNOSIS — L97828 Non-pressure chronic ulcer of other part of left lower leg with other specified severity: Secondary | ICD-10-CM | POA: Insufficient documentation

## 2020-05-22 DIAGNOSIS — L97218 Non-pressure chronic ulcer of right calf with other specified severity: Secondary | ICD-10-CM | POA: Diagnosis not present

## 2020-05-22 DIAGNOSIS — G4733 Obstructive sleep apnea (adult) (pediatric): Secondary | ICD-10-CM | POA: Insufficient documentation

## 2020-05-22 DIAGNOSIS — E11622 Type 2 diabetes mellitus with other skin ulcer: Secondary | ICD-10-CM | POA: Insufficient documentation

## 2020-05-22 DIAGNOSIS — Z794 Long term (current) use of insulin: Secondary | ICD-10-CM | POA: Diagnosis not present

## 2020-05-22 DIAGNOSIS — I130 Hypertensive heart and chronic kidney disease with heart failure and stage 1 through stage 4 chronic kidney disease, or unspecified chronic kidney disease: Secondary | ICD-10-CM | POA: Insufficient documentation

## 2020-05-22 DIAGNOSIS — E1151 Type 2 diabetes mellitus with diabetic peripheral angiopathy without gangrene: Secondary | ICD-10-CM | POA: Insufficient documentation

## 2020-05-22 DIAGNOSIS — L97222 Non-pressure chronic ulcer of left calf with fat layer exposed: Secondary | ICD-10-CM | POA: Diagnosis not present

## 2020-05-22 DIAGNOSIS — I482 Chronic atrial fibrillation, unspecified: Secondary | ICD-10-CM | POA: Insufficient documentation

## 2020-05-22 DIAGNOSIS — I5032 Chronic diastolic (congestive) heart failure: Secondary | ICD-10-CM | POA: Diagnosis not present

## 2020-05-22 DIAGNOSIS — E1122 Type 2 diabetes mellitus with diabetic chronic kidney disease: Secondary | ICD-10-CM | POA: Diagnosis not present

## 2020-05-22 DIAGNOSIS — J449 Chronic obstructive pulmonary disease, unspecified: Secondary | ICD-10-CM | POA: Insufficient documentation

## 2020-05-22 DIAGNOSIS — L97822 Non-pressure chronic ulcer of other part of left lower leg with fat layer exposed: Secondary | ICD-10-CM | POA: Diagnosis not present

## 2020-05-22 DIAGNOSIS — I252 Old myocardial infarction: Secondary | ICD-10-CM | POA: Insufficient documentation

## 2020-05-22 DIAGNOSIS — I739 Peripheral vascular disease, unspecified: Secondary | ICD-10-CM | POA: Diagnosis not present

## 2020-05-22 DIAGNOSIS — Z923 Personal history of irradiation: Secondary | ICD-10-CM | POA: Insufficient documentation

## 2020-05-22 DIAGNOSIS — L02416 Cutaneous abscess of left lower limb: Secondary | ICD-10-CM | POA: Diagnosis not present

## 2020-05-22 DIAGNOSIS — Z85118 Personal history of other malignant neoplasm of bronchus and lung: Secondary | ICD-10-CM | POA: Diagnosis not present

## 2020-05-22 NOTE — Progress Notes (Signed)
[name], [name] ([id number]) Visit Report for 05/22/2020 Debridement Details Patient Name: Date of Service: [name] RIS 05/22/2020 11:00 A M Medical Record Number: [id number] Patient Account Number: [id number] Date of Birth/Sex: Treating RN: 1946-02-08 (74 y.o. [name] Primary Care Provider: [name] Other Clinician: Referring Provider: Treating Provider/Extender: [name] in Treatment: 11 Debridement Performed for Assessment: Wound #1 Right,Posterior Lower Leg Performed By: Clinician Baruch Gouty, RN Debridement Type: Chemical/Enzymatic/Mechanical Agent Used: Santyl Severity of Tissue Pre Debridement: Fat layer exposed Level of Consciousness (Pre-procedure): Awake and Alert Pre-procedure Verification/Time Out Yes - 12:00 Taken: Bleeding: None Response to Treatment: Procedure was tolerated well Level of Consciousness (Post- Awake and Alert procedure): Post Debridement Measurements of Total Wound Length: (cm) 0.6 Width: (cm) 0.6 Depth: (cm) 0.2 Volume: (cm) 0.057 Character of Wound/Ulcer Post Debridement: Requires Further Debridement Severity of Tissue Post Debridement: Fat layer exposed Post Procedure Diagnosis Same as Pre-procedure Electronic Signature(s) Signed: 05/22/2020 5:37:49 PM By: Linton Ham MD Signed: 05/22/2020 6:08:00 PM By: Deon Pilling Entered By: Deon Pilling on 05/22/2020 13:49:45 -------------------------------------------------------------------------------- Debridement Details Patient Name: Date of Service: [name] Mode, DO RIS 05/22/2020 11:00 A M Medical Record Number: [id number] Patient Account Number: [id number] Date of Birth/Sex: Treating RN: Sep 17, 1945 (74 y.o. [name] Primary Care Provider: [name] Other Clinician: Referring Provider: Treating Provider/Extender: [name] in Treatment: 11 Debridement Performed for Assessment: Wound  #2 Left,Anterior Lower Leg Performed By: Clinician Baruch Gouty, RN Debridement Type: Chemical/Enzymatic/Mechanical Agent Used: Santyl Severity of Tissue Pre Debridement: Fat layer exposed Level of Consciousness (Pre-procedure): Awake and Alert Pre-procedure Verification/Time Out Yes - 12:00 Taken: Bleeding: None Response to Treatment: Procedure was tolerated well Level of Consciousness (Post- Awake and Alert Awake and Alert procedure): Post Debridement Measurements of Total Wound Length: (cm) 1.9 Width: (cm) 2 Depth: (cm) 0.2 Volume: (cm) 0.597 Character of Wound/Ulcer Post Debridement: Requires Further Debridement Severity of Tissue Post Debridement: Fat layer exposed Post Procedure Diagnosis Same as Pre-procedure Electronic Signature(s) Signed: 05/22/2020 5:37:49 PM By: Linton Ham MD Signed: 05/22/2020 6:08:00 PM By: Deon Pilling Entered By: Deon Pilling on 05/22/2020 13:57:00 -------------------------------------------------------------------------------- Debridement Details Patient Name: Date of Service: [name] Mode, DO RIS 05/22/2020 11:00 A M Medical Record Number: [id number] Patient Account Number: [id number] Date of Birth/Sex: Treating RN: Mar 15, 1946 (74 y.o. [name] Primary Care Provider: [name] Other Clinician: Referring Provider: Treating Provider/Extender: [name] in Treatment: 11 Debridement Performed for Assessment: Wound #3 Left,Proximal,Anterior Lower Leg Performed By: Clinician Baruch Gouty, RN Debridement Type: Chemical/Enzymatic/Mechanical Agent Used: Santyl Severity of Tissue Pre Debridement: Fat layer exposed Level of Consciousness (Pre-procedure): Awake and Alert Pre-procedure Verification/Time Out Yes - 12:00 Taken: Bleeding: None Response to Treatment: Procedure was tolerated well Level of Consciousness (Post- Awake and Alert procedure): Post Debridement Measurements of Total  Wound Length: (cm) 3.9 Width: (cm) 2.3 Depth: (cm) 0.3 Volume: (cm) 2.114 Character of Wound/Ulcer Post Debridement: Requires Further Debridement Severity of Tissue Post Debridement: Fat layer exposed Post Procedure Diagnosis Same as Pre-procedure Electronic Signature(s) Signed: 05/22/2020 5:37:49 PM By: Linton Ham MD Signed: 05/22/2020 6:08:00 PM By: Deon Pilling Entered By: Deon Pilling on 05/22/2020 14:19:11 -------------------------------------------------------------------------------- Debridement Details Patient Name: Date of Service: [name] Mode, DO RIS 05/22/2020 11:00 A M Medical Record Number: [id number] Patient Account Number: [id number] Date of Birth/Sex: Treating RN: 1945/08/10 (74 y.o. [name] Primary Care Provider: [name] Other Clinician: Referring Provider:  Treating Provider/Extender: [name] in Treatment: 11 Debridement Performed for Assessment: Wound #4 Left,Posterior Lower Leg Performed By: Clinician Baruch Gouty, RN Debridement Type: Chemical/Enzymatic/Mechanical Agent Used: Santyl Severity of Tissue Pre Debridement: Fat layer exposed Level of Consciousness (Pre-procedure): Awake and Alert Pre-procedure Verification/Time Out Yes - 12:00 Taken: Bleeding: None Response to Treatment: Procedure was tolerated well Level of Consciousness (Post- Awake and Alert procedure): Post Debridement Measurements of Total Wound Length: (cm) 0.5 Width: (cm) 0.5 Depth: (cm) 0.1 Volume: (cm) 0.02 Character of Wound/Ulcer Post Debridement: Requires Further Debridement Severity of Tissue Post Debridement: Fat layer exposed Post Procedure Diagnosis Same as Pre-procedure Electronic Signature(s) Signed: 05/22/2020 5:37:49 PM By: Linton Ham MD Signed: 05/22/2020 6:08:00 PM By: Deon Pilling Entered By: Deon Pilling on 05/22/2020  14:19:31 -------------------------------------------------------------------------------- HPI Details Patient Name: Date of Service: [name] Mode, DO RIS 05/22/2020 11:00 A M Medical Record Number: [id number] Patient Account Number: [id number] Date of Birth/Sex: Treating RN: 07/04/1946 (74 y.o. [name], [name] Primary Care Provider: [name] Other Clinician: Referring Provider: Treating Provider/Extender: [name] in Treatment: 11 History of Present Illness HPI Description: 74 year old female with bilateral leg wounds one on the left anterior leg and 1 right posterior leg for about 6 weeks, lately she has noted excessive drainage that soaks towels at night by the time she wakes up in the morning, especially from the left shin wound. Patient stated this started out as pinholes and became bigger. She had a couple 1 on the left and one on the right more distal near the feet close up on their own. She has been using Neosporin and clobetasol cream to the area and using gauze dressing. ABIs today right unobtainable, left 0.8, Interestingly her ABIs in 2018 were 0.47 on the right and 0.71 on the left, Performed by Dr. Earleen Newport Patient's history is complex with post renal transplant and kidney disease stage IV, history of lung cancer status post XRT insulin requiring diabetes, chronic A. , fib status post Maze procedure, diastolic heart failure, A. fib on Coumadin, aortic stenosis status post AVR, OSA without CPAP use. Patient denies any fevers chills shakes, has significant degree of pain in the shin area and the wound and has difficulty having any pressure on it 8/27; patient was admitted to clinic on 8/13. She has punched out areas on the left lateral and right lateral. Neither 1 of these have viable surfaces. We used silver alginate on them when she first came in she has 3+ pitting edema bilaterally in her lower extremities and some degree of PAD. We ordered  arterial studies on her last time I do not know that those have been done. The patient complains of pain in the legs stating the wraps were too tight. She has home health I think changing the dressings. She says she was in the ER on Monday because of pain. She has chronic kidney disease stage IV and is on very high doses of torsemide Addendum; after patient left the clinic were able to resurrect her arterial studies from 8/23. This showed on the right and ABI 1.58 on IV noncompressible waveforms were monophasic great toe waveform was absent. On the left ABI at 1.58 again great toe waveform was absent. Monophasic waveforms 9/27; this is a patient that was admitted to the clinic in August by Dr. Evette Doffing. I saw her once on 8/27. She has ischemic wounds on her bilateral lower extremities. Her noninvasive studies were quoted above. She saw  Dr. [name] on 04/10/2020 am not exactly sure who made this appointment. She tells [place] that she is in a lot of pain 9-1/2 out of 10 can only walk minimal distances at home. She has severe chronic renal failure status post renal transplant. She has a shunt in her left arm. She also has chronic atrial fibrillation on Coumadin. Dr. [name] felt she had slowly healing wounds I am not exactly sure these are healing. Dr. [name] felt that any attempt at imaging her would put her into dialysis range chronic renal failure which is probably accurate.. It was she was not felt to be a candidate for an open operation but might be a percutaneous revascularization. She has 1 on the right lateral lower leg and area on the left lateral. These are punched out ischemic looking. She had a new painful swelling on the left anterior. I unroofed some eschar on this to expose copious amounts of pus which I have obtained for culture. She has been on Keflex I think that it Dr. [name] actually prescribed and I have continued this today pending culture of the abscess. She is allergic to Bactrim  and doxycycline 10/5; the patient is not doing well. The small abscess that I noted last week on the left anterior lower leg just above her original wound grew Enterobacter cloacae I. This was resistant to cefazolin I therefore have her on cefdinir. Is today with a larger necrotic wound on her left anterior tibia where I cultured last time the other wounds on the left lower anterior tibia and the right lateral calf are about the same. Nonviable surface I think the patient is really in a lot of pain. I think this is claudication at rest for the most part. She is still not really willing to take the risk of an angiogram until her kidney function improves although I am not really sure where we are with that she follows with nephrology as well. She does have a shunt in her left arm The last lab work I see was in late August. At which time her creatinine was 2.90 estimated GFR of 17. However her glucose at that time was 700. She says she is hoping that her creatinine comes down/improves. I am not sure where we are with that. I cannot see her nephrologist notes 10/18; 2-week follow-up. The patient is not doing well. 2 necrotic wounds on the left anterior tibia. And a small punched-out area on the left posterior calf. We are using Iodoflex to the wound areas. She is having a lot of pain I think this is claudication. She sees her nephrologist tomorrow she was reluctant to even consider angiography until she talk to Dr. Joelyn Oms who is her nephrologist. I think she has a follow-up appointment with Dr. [name] later this month on 10/29 11th the patient continues not to do well. A lot of pain. 2 large punched out necrotic areas on the left anterior leg 2 small areas on the left posterior and right posterior calf. She does not keep compression wraps on we are using kerlix and light Coban she takes these off within 12 hours therefore there is little point in doing this. I think she also has chronic venous  insufficiency. She follows with Dr. [name] this afternoon unfortunately I think this patient is going to come down to a risky attempt to revascularize her vis--vis her stage IV chronic renal failure versus perhaps an amputation. Fairly clear that the ischemic pain is wearing her down Electronic Signature(s)  Signed: 05/22/2020 5:37:49 PM By: Linton Ham MD Entered By: Linton Ham on 05/22/2020 13:12:44 -------------------------------------------------------------------------------- Physical Exam Details Patient Name: Date of Service: [name] Mode, DO RIS 05/22/2020 11:00 A M Medical Record Number: [id number] Patient Account Number: [id number] Date of Birth/Sex: Treating RN: [birth year] (74 y.o. [name] Primary Care Provider: [name] Other Clinician: Referring Provider: Treating Provider/Extender: [name] in Treatment: 11 Constitutional Sitting or standing Blood Pressure is within target range for patient.. Pulse regular and within target range for patient.Marland Kitchen Respirations regular, non-labored and within target range.. Temperature is normal and within the target range for the patient.Marland Kitchen Appears in no distress. Cardiovascular Needle pulses are notpedal pulses are not palpable. Notes Wound exam; left anterior tibia 2 punched-out wounds with necrotic surfaces. No attempted debridement given her ischemia. She has smaller areas on the posterior left and posterior right calf he is also have necrotic surfaces. No attempt to debride these as well as well Electronic Signature(s) Signed: 05/22/2020 5:37:49 PM By: Linton Ham MD Entered By: Linton Ham on 05/22/2020 13:16:15 -------------------------------------------------------------------------------- Physician Orders Details Patient Name: Date of Service: [name] Mode, DO RIS 05/22/2020 11:00 A M Medical Record Number: [id number] Patient Account Number: [id number] Date of Birth/Sex:  Treating RN: 27-Apr-1946 (74 y.o. 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 Dressing Change Frequency Change dressing every day. - twice a week by home health all other days my patient or family. Wound Cleansing Clean wound with Normal Saline. - or wound cleanser Primary Wound Dressing Wound #1 Right,Posterior Lower Leg Santyl Ointment - apply directly to wound bed. Wound #2 Left,Anterior Lower Leg Santyl Ointment - apply directly to wound bed. Wound #3 Left,Proximal,Anterior Lower Leg Santyl Ointment - apply directly to wound bed. Wound #4 Left,Posterior Lower Leg Santyl Ointment - apply directly to wound bed. Secondary Dressing Kerlix/Rolled Gauze ABD pad Edema Control Avoid standing for long periods of time Elevate legs to the level of the heart or above for 30 minutes daily and/or when sitting, a frequency of: - throughout the day [place] skilled [facility] for wound care. - [facility] Patient Medications llergies: ibuprofen, Sulfa (Sulfonamide Antibiotics), tramadol, doxycycline, Hydrocil, Bactrim, red dye A Notifications Medication Indication Start End 05/22/2020 Santyl DOSE topical 250 unit/gram ointment - ointment topical to wounds change dialy Electronic  Signature(s) Signed: 05/22/2020 12:19:40 PM By: Linton Ham MD Entered By: Linton Ham on 05/22/2020 12:19:39 -------------------------------------------------------------------------------- Problem List Details Patient Name: Date of Service: [name] Mode, DO RIS 05/22/2020 11:00 A M Medical Record Number: [id number] Patient Account Number: [id number] Date of Birth/Sex: Treating RN: 08-09-1945 (74 y.o. [name] Primary Care Provider: [name] Other Clinician: Referring Provider: Treating Provider/Extender: [name] in Treatment: 11 Active Problems ICD-10 Encounter Code Description Active Date MDM Diagnosis L97.211 Non-pressure chronic ulcer of right calf limited to breakdown of skin 02/29/2020 No Yes L97.828 Non-pressure chronic  ulcer of other part of left lower leg with other specified 05/05/2020 No Yes severity N18.4 Chronic kidney disease, stage 4 (severe) 02/29/2020 No Yes E11.22 Type 2 diabetes mellitus with diabetic chronic kidney disease 02/29/2020 No Yes L02.416 Cutaneous abscess of left lower limb 04/14/2020 No Yes L97.218 Non-pressure chronic ulcer of right calf with other specified severity 05/05/2020 No Yes Inactive Problems ICD-10 Code Description Active Date Inactive Date U27.25 Chronic diastolic (congestive) heart failure 02/29/2020 02/29/2020 L97.221 Non-pressure chronic ulcer of left calf limited to breakdown of skin 02/29/2020 02/29/2020 Resolved Problems Electronic Signature(s) Signed: 05/22/2020 5:37:49 PM By: Linton Ham MD Entered By: Linton Ham on 05/22/2020 13:10:36 -------------------------------------------------------------------------------- Progress Note Details Patient Name: Date of Service: [name] Mode, DO RIS 05/22/2020 11:00 A M Medical Record Number: [id number] Patient Account Number: [id number] Date of Birth/Sex: Treating RN: 04-07-1946 (74 y.o. [name], [name] Primary Care Provider:  [name] Other Clinician: Referring Provider: Treating Provider/Extender: [name] in Treatment: 11 Subjective History of Present Illness (HPI) 74 year old female with bilateral leg wounds one on the left anterior leg and 1 right posterior leg for about 6 weeks, lately she has noted excessive drainage that soaks towels at night by the time she wakes up in the morning, especially from the left shin wound. Patient stated this started out as pinholes and became bigger. She had a couple 1 on the left and one on the right more distal near the feet close up on their own. She has been using Neosporin and clobetasol cream to the area and using gauze dressing. ABIs today right unobtainable, left 0.8, Interestingly her ABIs in 2018 were 0.47 on the right and 0.71 on the left, Performed by Dr. Earleen Newport Patient's history is complex with post renal transplant and kidney disease stage IV, history of lung cancer status post XRT insulin requiring diabetes, chronic A. , fib status post Maze procedure, diastolic heart failure, A. fib on Coumadin, aortic stenosis status post AVR, OSA without CPAP use. Patient denies any fevers chills shakes, has significant degree of pain in the shin area and the wound and has difficulty having any pressure on it 8/27; patient was admitted to clinic on 8/13. She has punched out areas on the left lateral and right lateral. Neither 1 of these have viable surfaces. We used silver alginate on them when she first came in she has 3+ pitting edema bilaterally in her lower extremities and some degree of PAD. We ordered arterial studies on her last time I do not know that those have been done. The patient complains of pain in the legs stating the wraps were too tight. She has home health I think changing the dressings. She says she was in the ER on Monday because of pain. She has chronic kidney disease stage IV and is on very high doses of  torsemide Addendum; after patient left the clinic were able to resurrect her arterial studies from 8/23. This showed on the right and ABI 1.58 on IV noncompressible waveforms were monophasic great toe waveform was absent. On the left ABI at 1.58 again great toe waveform was absent. Monophasic waveforms 9/27; this is a patient that was admitted to the clinic in August by Dr. Evette Doffing. I saw her once on 8/27. She has ischemic wounds on her bilateral lower extremities. Her noninvasive studies were quoted above. She saw Dr. [name] on 04/10/2020 am not exactly sure who made this appointment. She tells [place] that she is in a lot of  pain 9-1/2 out of 10 can only walk minimal distances at home. She has severe chronic renal failure status post renal transplant. She has a shunt in her left arm. She also has chronic atrial fibrillation on Coumadin. Dr. [name] felt she had slowly healing wounds I am not exactly sure these are healing. Dr. [name] felt that any attempt at imaging her would put her into dialysis range chronic renal failure which is probably accurate.. It was she was not felt to be a candidate for an open operation but might be a percutaneous revascularization. She has 1 on the right lateral lower leg and area on the left lateral. These are punched out ischemic looking. She had a new painful swelling on the left anterior. I unroofed some eschar on this to expose copious amounts of pus which I have obtained for culture. She has been on Keflex I think that it Dr. [name] actually prescribed and I have continued this today pending culture of the abscess. She is allergic to Bactrim and doxycycline 10/5; the patient is not doing well. The small abscess that I noted last week on the left anterior lower leg just above her original wound grew Enterobacter cloacae I. This was resistant to cefazolin I therefore have her on cefdinir. Is today with a larger necrotic wound on her left anterior tibia where I cultured  last time the other wounds on the left lower anterior tibia and the right lateral calf are about the same. Nonviable surface I think the patient is really in a lot of pain. I think this is claudication at rest for the most part. She is still not really willing to take the risk of an angiogram until her kidney function improves although I am not really sure where we are with that she follows with nephrology as well. She does have a shunt in her left arm The last lab work I see was in late August. At which time her creatinine was 2.90 estimated GFR of 17. However her glucose at that time was 700. She says she is hoping that her creatinine comes down/improves. I am not sure where we are with that. I cannot see her nephrologist notes 10/18; 2-week follow-up. The patient is not doing well. 2 necrotic wounds on the left anterior tibia. And a small punched-out area on the left posterior calf. We are using Iodoflex to the wound areas. She is having a lot of pain I think this is claudication. She sees her nephrologist tomorrow she was reluctant to even consider angiography until she talk to Dr. Joelyn Oms who is her nephrologist. I think she has a follow-up appointment with Dr. [name] later this month on 10/29 11th the patient continues not to do well. A lot of pain. 2 large punched out necrotic areas on the left anterior leg 2 small areas on the left posterior and right posterior calf. She does not keep compression wraps on we are using kerlix and light Coban she takes these off within 12 hours therefore there is little point in doing this. I think she also has chronic venous insufficiency. She follows with Dr. [name] this afternoon unfortunately I think this patient is going to come down to a risky attempt to revascularize her vis--vis her stage IV chronic renal failure versus perhaps an amputation. Fairly clear that the ischemic pain is wearing her down Objective Constitutional Sitting or standing Blood  Pressure is within target range for patient.. Pulse regular and within target range for patient.Marland Kitchen Respirations regular, non-labored  and within target range.. Temperature is normal and within the target range for the patient.Marland Kitchen Appears in no distress. Vitals Time Taken: 11:40 AM, Height: 60 in, Weight: 160 lbs, BMI: 31.2, Temperature: 98.8 F, Pulse: 71 bpm, Respiratory Rate: 20 breaths/min, Blood Pressure: 124/75 mmHg, Capillary Blood Glucose: 108 mg/dl. Cardiovascular Needle pulses are notpedal pulses are not palpable. General Notes: Wound exam; left anterior tibia 2 punched-out wounds with necrotic surfaces. No attempted debridement given her ischemia. She has smaller areas on the posterior left and posterior right calf he is also have necrotic surfaces. No attempt to debride these as well as well Integumentary (Hair, Skin) Wound #1 status is Open. Original cause of wound was Gradually Appeared. The wound is located on the Right,Posterior Lower Leg. The wound measures 0.6cm length x 0.6cm width x 0.2cm depth; 0.283cm^2 area and 0.057cm^3 volume. There is Fat Layer (Subcutaneous Tissue) exposed. There is no tunneling or undermining noted. There is a small amount of serous drainage noted. The wound margin is distinct with the outline attached to the wound base. There is small (1-33%) pink granulation within the wound bed. There is a large (67-100%) amount of necrotic tissue within the wound bed including Adherent Slough. Wound #2 status is Open. Original cause of wound was Gradually Appeared. The wound is located on the Left,Anterior Lower Leg. The wound measures 1.9cm length x 2cm width x 0.2cm depth; 2.985cm^2 area and 0.597cm^3 volume. There is Fat Layer (Subcutaneous Tissue) exposed. There is no tunneling or undermining noted. There is a small amount of serous drainage noted. The wound margin is distinct with the outline attached to the wound base. There is small (1-33%) pink granulation within  the wound bed. There is a large (67-100%) amount of necrotic tissue within the wound bed including Adherent Slough. Wound #3 status is Open. Original cause of wound was Gradually Appeared. The wound is located on the Left,Proximal,Anterior Lower Leg. The wound measures 3.9cm length x 2.3cm width x 0.3cm depth; 7.045cm^2 area and 2.114cm^3 volume. There is Fat Layer (Subcutaneous Tissue) exposed. There is no tunneling or undermining noted. There is a small amount of serous drainage noted. The wound margin is distinct with the outline attached to the wound base. There is no granulation within the wound bed. There is a large (67-100%) amount of necrotic tissue within the wound bed including Eschar and Adherent Slough. Wound #4 status is Open. Original cause of wound was Gradually Appeared. The wound is located on the Left,Posterior Lower Leg. The wound measures 0.5cm length x 0.5cm width x 0.1cm depth; 0.196cm^2 area and 0.02cm^3 volume. There is Fat Layer (Subcutaneous Tissue) exposed. There is no tunneling or undermining noted. There is a small amount of serous drainage noted. The wound margin is distinct with the outline attached to the wound base. There is small (1-33%) pink granulation within the wound bed. There is a large (67-100%) amount of necrotic tissue within the wound bed including Adherent Slough. 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 Dressing Change Frequency: Change dressing every day. - twice a week by home health all other days my patient or family. Wound  Cleansing: Clean wound  with Normal Saline. - or wound cleanser Primary Wound Dressing: Wound #1 Right,Posterior Lower Leg: Santyl Ointment - apply directly to wound bed. Wound #2 Left,Anterior Lower Leg: Santyl Ointment - apply directly to wound bed. Wound #3 Left,Proximal,Anterior Lower Leg: Santyl Ointment - apply directly to wound bed. Wound #4 Left,Posterior Lower Leg: Santyl Ointment - apply directly to wound bed. Secondary Dressing: Kerlix/Rolled Gauze ABD pad Edema Control: Avoid standing for long periods of time Elevate legs to the level of the heart or above for 30 minutes daily and/or when sitting, a frequency of: - throughout the day Home Health: [facility] for wound care. - [facility] The following medication(s) was prescribed: Santyl topical 250 unit/gram ointment ointment topical to wounds change dialy starting 05/22/2020 #1 I changed her primary dressing to Santyl from Iodoflex 2. I was attempting to keep her in some degree of compression because of the degree of chronic venous insufficiency. I think she has arterial and venous insufficiency but she simply will not keep even light compression on therefore this is pointless 3. She sees Dr. [name] this afternoon. She is in unrelenting pain and really looks worn out. We will see if the cost benefit ratio at this point is in favor of an angiogram even though she has stage IV chronic renal failure she has a shunt in her left arm. I think eventually she may require this even though there is a chance of throwing her into stage V chronic renal failure Electronic Signature(s) Signed: 05/22/2020 5:37:49 PM By: Linton Ham MD Entered By: Linton Ham on 05/22/2020 13:18:02 -------------------------------------------------------------------------------- SuperBill Details Patient Name: Date of Service: [name] Mode, DO RIS 05/22/2020 Medical Record Number: [id number] Patient Account Number: [id number] Date  of Birth/Sex: Treating RN: 1945-11-21 (74 y.o. [name], [name] Primary Care Provider: [name] Other Clinician: Referring Provider: Treating Provider/Extender: [name] in Treatment: 11 Diagnosis Coding ICD-10 Codes Code Description [phone] Non-pressure chronic ulcer of right calf limited to breakdown of skin L97.828 Non-pressure chronic ulcer of other part of left lower leg with other specified severity N18.4 Chronic kidney disease, stage 4 (severe) E11.22 Type 2 diabetes mellitus with diabetic chronic kidney disease L02.416 Cutaneous abscess of left lower limb L97.218 Non-pressure chronic ulcer of right calf with other specified severity Facility Procedures CPT4 Code: [id number] Description: [phone] - [facility] W/O ANES NON SELECT Modifier: Quantity: 1 Physician Procedures : CPT4 Code Description Modifier [id number] - WC PHYS LEVEL 4 - EST PT ICD-10 Diagnosis Description L97.211 Non-pressure chronic ulcer of right calf limited to breakdown of skin L97.828 Non-pressure chronic ulcer of other part of left lower leg with  other specified severity N18.4 Chronic kidney disease, stage 4 (severe) Quantity: 1 Electronic Signature(s) Signed: 05/22/2020 5:37:49 PM By: Linton Ham MD Signed: 05/22/2020 6:08:00 PM By: Deon Pilling Entered By: Deon Pilling on 05/22/2020 14:20:00

## 2020-05-22 NOTE — H&P (View-Only) (Signed)
Patient is a 74 year old female who returns for follow-up today.  We have been following her chronically for a left pretibial wound.  She was initially seen in August 2021.  The wound has been present at least since June of this year.  I have discussed with her previously an arteriogram to further evaluate her for possible intervention and improved perfusion of the left leg but due to renal dysfunction she has refused this in the past.  She is a very poorly controlled diabetic.  She is being seen at the wound center currently but they believe that her wound is not progressing.  Patient has a lot of pain in the wound.  She is on home oxygen.  She has chronic atrial fibrillation and is on warfarin.  Most recent serum creatinine was 2.9 in August 2021.  She is also receiving radiation therapy for a chest nodule.  Past Medical History:  Diagnosis Date  . Acute CHF (congestive heart failure) ([place]) 12/2018  . Adenocarcinoma of lung, stage 1, right ([place]) 11/25/2017  . Anemia, iron deficiency    of chronic disease  . Aortic stenosis    a. Severe AS by echo 11/2012.  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 Asystole ([place])    a. During ENT surgery 2005: developed marked asystole requiring CPR, felt due to vagal reaction (cath nonobst dz).  . Carotid artery disease ([place])    a. Carotid Dopplers performed in August 2013 showed 40-59% left stenosis and 0-39% right; f/u recommended in 2 years.   . Cerebrovascular accident [facility]) 2009   a. LMCA infarct felt embolic 7829, maintained on chronic coumadin.; denies residual on 04/05/2013  . Cholelithiasis   . Chronic Persistent Atrial Fibrillation 12/31/2008   Qualifier: Diagnosis of  By: [name]    . Coronary artery disease 05/2002   a. Ant MI 2003 s/p PTCA/stent to RCA.   . Diverticulosis of colon   . Esophagitis, reflux   . ESRD (end stage renal disease) ([place])    a. Mass on L kidney per pt s/p nephrectomy - pt states not cancer - [facility]  notes indicate ESRD due to HTN/DM - was previously on HD. b. Kidney transplant 02/2011.  Marland Kitchen GERD (gastroesophageal reflux disease)   . Gout   . Hearing loss   . Helicobacter pylori (H. pylori) infection    hx of  . Hemorrhoids   . Hx of colonic polyps    adenomatous  . Hyperlipidemia   . Hypertension   . Lung nodule seen on imaging study 04/07/2013   1.0 cm ground glass opacity RUL  . Myocardial infarction ([place]) 2003  . Pericardial effusion    a. Small by echo 11/2011.  . S/P aortic valve replacement with bioprosthetic valve and maze procedure 04/12/2013   30mm Bdpec Asc Show Low Ease bovine pericardial tissue valve   . S/P Maze operation for atrial fibrillation 04/12/2013   Complete bilateral atrial lesion set using bipolar radiofrequency and cryothermy ablation with clipping of LA appendage  . Sleep apnea    Pt says testing was positive, intolerant of CPAP.  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  . Stroke ([place])   . Type II diabetes mellitus ([place])     Past Surgical History:  Procedure Laterality Date  . AORTIC VALVE REPLACEMENT N/A 04/12/2013   Procedure: AORTIC VALVE REPLACEMENT (  AVR);  Surgeon: Rexene Alberts, MD;  Location: [place];  Service: Open Heart Surgery;  Laterality: N/A;  . ARTERIOVENOUS GRAFT PLACEMENT Left   . ARTERIOVENOUS GRAFT PLACEMENT Left    "I've had 2 on my left; had one removed" (04/05/2013)   . ARTERY EXPLORATION Right 04/11/2013   Procedure: ARTERY EXPLORATION;  Surgeon: Rexene Alberts, MD;  Location: [place];  Service: Open Heart Surgery;  Laterality: Right;  Right carotid artery exploration  . AV FISTULA PLACEMENT Right   . AV FISTULA REPAIR Right    "took it out" ((/18/2014)  . CARDIOVERSION  05/29/2012   Procedure: CARDIOVERSION;  Surgeon: Lelon Perla, MD;  Location: [facility] ENDOSCOPY;  Service:  Cardiovascular;  Laterality: N/A;  . CHOLECYSTECTOMY  2009   with hernia removal  . CORONARY ANGIOPLASTY WITH STENT PLACEMENT Right    coronary artery  . INSERTION OF DIALYSIS CATHETER Bilateral    "over the years; took them both out" (04/05/2013)  . INTRAOPERATIVE TRANSESOPHAGEAL ECHOCARDIOGRAM N/A 04/11/2013   Procedure: INTRAOPERATIVE TRANSESOPHAGEAL ECHOCARDIOGRAM;  Surgeon: Rexene Alberts, MD;  Location: [place];  Service: Open Heart Surgery;  Laterality: N/A;  . INTRAOPERATIVE TRANSESOPHAGEAL ECHOCARDIOGRAM N/A 04/12/2013   Procedure: INTRAOPERATIVE TRANSESOPHAGEAL ECHOCARDIOGRAM;  Surgeon: Rexene Alberts, MD;  Location: [place];  Service: Open Heart Surgery;  Laterality: N/A;  . KIDNEY TRANSPLANT  03/16/11  . LEFT AND RIGHT HEART CATHETERIZATION WITH CORONARY ANGIOGRAM N/A 04/06/2013   Procedure: LEFT AND RIGHT HEART CATHETERIZATION WITH CORONARY ANGIOGRAM;  Surgeon: Blane Ohara, MD;  Location[facility] C[facility] LAB;  Service: Cardiovascular;  Laterality: N/A;  . MAZE N/A 04/12/2013   Procedure: MAZE;  Surgeon: Rexene Alberts, MD;  Location: [facility];  Service: Open Heart Surgery;  Laterality: N/A;  . NASAL RECONSTRUCTION WITH SEPTAL REPAIR     "took it out" (04/05/2013)  . NEPHRECTOMY Left 2010   no CA on bx  . TONSILLECTOMY    . TOTAL ABDOMINAL HYSTERECTOMY    . TUBAL LIGATION     Current Outpatient Medications on File Prior to Visit  Medication Sig Dispense Refill  . acetaminophen (TYLENOL) 500 MG tablet Take 500 mg by mouth every 6 (six) hours as needed for mild pain or headache.    Marland Kitchen amoxicillin (AMOXIL) 500 MG capsule TAKE 4 CAPSULES BY MOUTH 1 HOUR PRIOR TO DENTAL WORK 4 capsule 1  . clobetasol ointment (TEMOVATE) 5.40 % Apply 1 application topically 2 (two) times daily. 60 g 1  . dapsone 25 MG tablet Take 25 mg by mouth daily.     Marland Kitchen gabapentin (NEURONTIN) 300 MG capsule Take 1 capsule (300 mg total) by mouth 3 (three) times daily. 90 capsule 5  . hydrALAZINE (APRESOLINE) 25 MG tablet  Take 25 mg by mouth 3 (three) times daily.     . insulin glargine, 1 Unit Dial, (TOUJEO SOLOSTAR) 300 UNIT/ML Solostar Pen Inject 60 Units into the skin every morning. Titrate up by 1 unit a day if needed for goal sugars of 100-130 up to 50 units a day max 3 pen 3  . insulin lispro (HUMALOG KWIKPEN) 200 UNIT/ML KwikPen Inject 10 Units into the skin with breakfast, with lunch, and with evening meal. 9 mL 1  . Insulin Pen Needle (B-D UF III MINI PEN NEEDLES) 31G X 5 MM MISC USE TO ADMINISTER INSULIN FOUR TIMES A DAY DX E11.9 100 each 1  . levothyroxine (SYNTHROID) 25 MCG tablet TAKE 2 TABLETS (50 MCG TOTAL) BY MOUTH DAILY. 180 tablet  3  . lidocaine (XYLOCAINE) 5 % ointment Apply 1 application topically 4 (four) times daily as needed. 50 g 1  . metoprolol tartrate (LOPRESSOR) 25 MG tablet Take 0.5 tablets (12.5 mg total) by mouth 2 (two) times daily. 90 tablet 3  . ONETOUCH DELICA LANCETS 99M MISC Use to check blood sugars three times a day DX E11.9 100 each 5  . ONETOUCH VERIO test strip USE TO TEST 3 TIMES DAILY. DX E11.9 100 strip 5  . oxyCODONE-acetaminophen (PERCOCET/ROXICET) 5-325 MG tablet Take 1 tablet by mouth every 8 (eight) hours as needed for severe pain. 60 tablet 0  . OXYGEN Inhale 3 L into the lungs as needed (for shortness of breath).     . predniSONE (DELTASONE) 5 MG tablet Take 1 tablet by mouth daily.    Marland Kitchen Propylene Glycol (SYSTANE BALANCE) 0.6 % SOLN Place 1-2 drops into both eyes 3 (three) times daily as needed (for dryness).    . repaglinide (PRANDIN) 0.5 MG tablet Take 0.5 mg by mouth 3 (three) times daily before meals.    . rosuvastatin (CRESTOR) 20 MG tablet Take 1 tablet (20 mg total) by mouth daily. (Patient taking differently: Take 20 mg by mouth every other day. ) 90 tablet 3  . tacrolimus (PROGRAF) 1 MG capsule Take 3 mg by mouth 2 (two) times daily.     Marland Kitchen torsemide (DEMADEX) 100 MG tablet Take 2 tablets (200 mg total) by mouth daily. 60 tablet 5  . triamcinolone cream  (KENALOG) 0.1 % Apply 1 application topically 4 (four) times daily. 80 g 1  . warfarin (COUMADIN) 5 MG tablet TAKE 1/2 TO 1 TABLET BY MOUTH DAILY AS DIRECTED BY COUMADIN CLINIC 90 tablet 1  . cephALEXin (KEFLEX) 500 MG capsule Take 1 capsule (500 mg total) by mouth 2 (two) times daily. (Patient not taking: Reported on 05/22/2020) 20 capsule 0  . potassium chloride SA (KLOR-CON M20) 20 MEQ tablet Take 1 tablet (20 mEq total) by mouth daily. (Patient taking differently: Take 40 mEq by mouth daily. ) 30 tablet 3   No current facility-administered medications on file prior to visit.    Physical exam:  Vitals:   05/22/20 1301  BP: (!) 136/58  Pulse: (!) 52  Resp: 20  Temp: (!) 97.4 F (36.3 C)  SpO2: 92%  Weight: 160 lb (72.6 kg)  Height: 5' (1.524 m)   Extremities: No palpable pedal pulses bilaterally pretibial edema bilaterally        Data: Previous ABIs indicated noncompressible calcified vessels.  He had monophasic flow suggestive of arterial occlusive disease bilaterally.  Assessment: Left pretibial wounds which have failed to heal despite conservative measures over the last several months.  Discussed with patient today the possibility of primary amputation versus arteriogram with possibility of renal failure from contrast nephropathy versus continued local wound care.  At this point she has opted for the arteriogram.  She understands that she could potentially go into end-stage renal disease requiring permanent hemodialysis from contrast reaction.  She also understands that even if we fix an arterial lesion she still may have progression of the wound and eventually require amputation.  Other risk benefits possible complications and procedure details were discussed with the patient and her husband today.  Plan: Aortogram lower extremity runoff left leg possible intervention scheduled for Friday, May 30, 2020.  We will try to use carbon dioxide as much as possible but will have  to give her some contrast as we get to the  lower portions of the leg.  We will bring her in early for the exam so that she can have IV hydration prior to the procedure.  She will get 1 L saline bolus prior to her arteriogram morning of procedure.  Ruta Hinds, MD [facility] Office: [phone]

## 2020-05-22 NOTE — Progress Notes (Signed)
Patient is a 74 year old female who returns for follow-up today.  We have been following her chronically for a left pretibial wound.  She was initially seen in August 2021.  The wound has been present at least since June of this year.  I have discussed with her previously an arteriogram to further evaluate her for possible intervention and improved perfusion of the left leg but due to renal dysfunction she has refused this in the past.  She is a very poorly controlled diabetic.  She is being seen at the wound center currently but they believe that her wound is not progressing.  Patient has a lot of pain in the wound.  She is on home oxygen.  She has chronic atrial fibrillation and is on warfarin.  Most recent serum creatinine was 2.9 in August 2021.  She is also receiving radiation therapy for a chest nodule.  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 Severe AS by echo 11/2012.   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 During ENT surgery 2005: developed marked asystole requiring CPR, felt due to vagal reaction (cath nonobst dz).   Carotid artery disease ([place])    a. Carotid Dopplers performed in August 2013 showed 40-59% left stenosis and 0-39% right; f/u recommended in 2 years.    Cerebrovascular accident [facility]) 2009   a. LMCA infarct felt embolic 1610, maintained on chronic coumadin.; denies residual on 04/05/2013   Cholelithiasis    Chronic Persistent Atrial Fibrillation 12/31/2008   Qualifier: Diagnosis of  By: [name]     Coronary artery disease 05/2002   a. Ant MI 2003 s/p PTCA/stent to RCA.    Diverticulosis of colon    Esophagitis, reflux    ESRD (end stage renal disease) ([place])    a. Mass on L kidney per pt s/p nephrectomy - pt states not cancer - [facility]  notes indicate ESRD due to HTN/DM - was previously on HD. b. Kidney transplant 02/2011.   GERD (gastroesophageal reflux disease)    Gout    Hearing loss    Helicobacter pylori (H. pylori) infection    hx of   Hemorrhoids    Hx of colonic polyps    adenomatous   Hyperlipidemia    Hypertension    Lung nodule seen on imaging study 04/07/2013   1.0 cm ground glass opacity RUL   Myocardial infarction [facility]) 2003   Pericardial effusion    a. Small by echo 11/2011.   S/P aortic valve replacement with bioprosthetic valve and maze procedure 04/12/2013   39mm [facility] Ease bovine pericardial tissue valve    S/P Maze operation for atrial fibrillation 04/12/2013   Complete bilateral atrial lesion set using bipolar radiofrequency and cryothermy ablation with clipping of LA appendage   Sleep apnea    Pt says testing was positive, intolerant of CPAP.   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   Stroke [facility])    Type II diabetes mellitus ([place])     Past Surgical History:  Procedure Laterality Date   AORTIC VALVE REPLACEMENT N/A 04/12/2013   Procedure: AORTIC VALVE REPLACEMENT (  AVR);  Surgeon: Rexene Alberts, MD;  Location: [place];  Service: Open Heart Surgery;  Laterality: N/A;   ARTERIOVENOUS GRAFT PLACEMENT Left    ARTERIOVENOUS GRAFT PLACEMENT Left    "I've had 2 on my left; had one removed" (04/05/2013)    ARTERY EXPLORATION Right 04/11/2013   Procedure: ARTERY EXPLORATION;  Surgeon: Rexene Alberts, MD;  Location: [place];  Service: Open Heart Surgery;  Laterality: Right;  Right carotid artery exploration   AV FISTULA PLACEMENT Right    AV FISTULA REPAIR Right    "took it out" ((/18/2014)   CARDIOVERSION  05/29/2012   Procedure: CARDIOVERSION;  Surgeon: Lelon Perla, MD;  Location: [facility] ENDOSCOPY;  Service:  Cardiovascular;  Laterality: N/A;   CHOLECYSTECTOMY  2009   with hernia removal   CORONARY ANGIOPLASTY WITH STENT PLACEMENT Right    coronary artery   INSERTION OF DIALYSIS CATHETER Bilateral    "over the years; took them both out" (04/05/2013)   INTRAOPERATIVE TRANSESOPHAGEAL ECHOCARDIOGRAM N/A 04/11/2013   Procedure: INTRAOPERATIVE TRANSESOPHAGEAL ECHOCARDIOGRAM;  Surgeon: Rexene Alberts, MD;  Location: [place];  Service: Open Heart Surgery;  Laterality: N/A;   INTRAOPERATIVE TRANSESOPHAGEAL ECHOCARDIOGRAM N/A 04/12/2013   Procedure: INTRAOPERATIVE TRANSESOPHAGEAL ECHOCARDIOGRAM;  Surgeon: Rexene Alberts, MD;  Location: [place];  Service: Open Heart Surgery;  Laterality: N/A;   KIDNEY TRANSPLANT  03/16/11   LEFT AND RIGHT HEART CATHETERIZATION WITH CORONARY ANGIOGRAM N/A 04/06/2013   Procedure: LEFT AND RIGHT HEART CATHETERIZATION WITH CORONARY ANGIOGRAM;  Surgeon: Blane Ohara, MD;  Location[facility] CATH LAB;  Service: Cardiovascular;  Laterality: N/A;   MAZE N/A 04/12/2013   Procedure: MAZE;  Surgeon: Rexene Alberts, MD;  Location: [place];  Service: Open Heart Surgery;  Laterality: N/A;   NASAL RECONSTRUCTION WITH SEPTAL REPAIR     "took it out" (04/05/2013)   NEPHRECTOMY Left 2010   no CA on bx   TONSILLECTOMY     TOTAL ABDOMINAL HYSTERECTOMY     TUBAL LIGATION     Current Outpatient Medications on File Prior to Visit  Medication Sig Dispense Refill   acetaminophen (TYLENOL) 500 MG tablet Take 500 mg by mouth every 6 (six) hours as needed for mild pain or headache.     amoxicillin (AMOXIL) 500 MG capsule TAKE 4 CAPSULES BY MOUTH 1 HOUR PRIOR TO DENTAL WORK 4 capsule 1   clobetasol ointment (TEMOVATE) 2.11 % Apply 1 application topically 2 (two) times daily. 60 g 1   dapsone 25 MG tablet Take 25 mg by mouth daily.      gabapentin (NEURONTIN) 300 MG capsule Take 1 capsule (300 mg total) by mouth 3 (three) times daily. 90 capsule 5   hydrALAZINE (APRESOLINE) 25 MG tablet  Take 25 mg by mouth 3 (three) times daily.      insulin glargine, 1 Unit Dial, (TOUJEO SOLOSTAR) 300 UNIT/ML Solostar Pen Inject 60 Units into the skin every morning. Titrate up by 1 unit a day if needed for goal sugars of 100-130 up to 50 units a day max 3 pen 3   insulin lispro (HUMALOG KWIKPEN) 200 UNIT/ML KwikPen Inject 10 Units into the skin with breakfast, with lunch, and with evening meal. 9 mL 1   Insulin Pen Needle (B-D UF III MINI PEN NEEDLES) 31G X 5 MM MISC USE TO ADMINISTER INSULIN FOUR TIMES A DAY DX E11.9 100 each 1   levothyroxine (SYNTHROID) 25 MCG tablet TAKE 2 TABLETS (50 MCG TOTAL) BY MOUTH DAILY. 180 tablet  3   lidocaine (XYLOCAINE) 5 % ointment Apply 1 application topically 4 (four) times daily as needed. 50 g 1   metoprolol tartrate (LOPRESSOR) 25 MG tablet Take 0.5 tablets (12.5 mg total) by mouth 2 (two) times daily. 90 tablet 3   ONETOUCH DELICA LANCETS 60V MISC Use to check blood sugars three times a day DX E11.9 100 each 5   ONETOUCH VERIO test strip USE TO TEST 3 TIMES DAILY. DX E11.9 100 strip 5   oxyCODONE-acetaminophen (PERCOCET/ROXICET) 5-325 MG tablet Take 1 tablet by mouth every 8 (eight) hours as needed for severe pain. 60 tablet 0   OXYGEN Inhale 3 L into the lungs as needed (for shortness of breath).      predniSONE (DELTASONE) 5 MG tablet Take 1 tablet by mouth daily.     Propylene Glycol (SYSTANE BALANCE) 0.6 % SOLN Place 1-2 drops into both eyes 3 (three) times daily as needed (for dryness).     repaglinide (PRANDIN) 0.5 MG tablet Take 0.5 mg by mouth 3 (three) times daily before meals.     rosuvastatin (CRESTOR) 20 MG tablet Take 1 tablet (20 mg total) by mouth daily. (Patient taking differently: Take 20 mg by mouth every other day. ) 90 tablet 3   tacrolimus (PROGRAF) 1 MG capsule Take 3 mg by mouth 2 (two) times daily.      torsemide (DEMADEX) 100 MG tablet Take 2 tablets (200 mg total) by mouth daily. 60 tablet 5   triamcinolone cream  (KENALOG) 0.1 % Apply 1 application topically 4 (four) times daily. 80 g 1   warfarin (COUMADIN) 5 MG tablet TAKE 1/2 TO 1 TABLET BY MOUTH DAILY AS DIRECTED BY COUMADIN CLINIC 90 tablet 1   cephALEXin (KEFLEX) 500 MG capsule Take 1 capsule (500 mg total) by mouth 2 (two) times daily. (Patient not taking: Reported on 05/22/2020) 20 capsule 0   potassium chloride SA (KLOR-CON M20) 20 MEQ tablet Take 1 tablet (20 mEq total) by mouth daily. (Patient taking differently: Take 40 mEq by mouth daily. ) 30 tablet 3   No current facility-administered medications on file prior to visit.    Physical exam:  Vitals:   05/22/20 1301  BP: (!) 136/58  Pulse: (!) 52  Resp: 20  Temp: (!) 97.4 F (36.3 C)  SpO2: 92%  Weight: 160 lb (72.6 kg)  Height: 5' (1.524 m)   Extremities: No palpable pedal pulses bilaterally pretibial edema bilaterally        Data: Previous ABIs indicated noncompressible calcified vessels.  He had monophasic flow suggestive of arterial occlusive disease bilaterally.  Assessment: Left pretibial wounds which have failed to heal despite conservative measures over the last several months.  Discussed with patient today the possibility of primary amputation versus arteriogram with possibility of renal failure from contrast nephropathy versus continued local wound care.  At this point she has opted for the arteriogram.  She understands that she could potentially go into end-stage renal disease requiring permanent hemodialysis from contrast reaction.  She also understands that even if we fix an arterial lesion she still may have progression of the wound and eventually require amputation.  Other risk benefits possible complications and procedure details were discussed with the patient and her husband today.  Plan: Aortogram lower extremity runoff left leg possible intervention scheduled for Friday, May 30, 2020.  We will try to use carbon dioxide as much as possible but will have  to give her some contrast as we get to the  lower portions of the leg.  We will bring her in early for the exam so that she can have IV hydration prior to the procedure.  She will get 1 L saline bolus prior to her arteriogram morning of procedure.  Ruta Hinds, MD [facility] Office: [phone]

## 2020-05-23 ENCOUNTER — Ambulatory Visit (INDEPENDENT_AMBULATORY_CARE_PROVIDER_SITE_OTHER): Payer: Medicare Other | Admitting: Internal Medicine

## 2020-05-23 DIAGNOSIS — I2510 Atherosclerotic heart disease of native coronary artery without angina pectoris: Secondary | ICD-10-CM | POA: Diagnosis not present

## 2020-05-23 DIAGNOSIS — L97221 Non-pressure chronic ulcer of left calf limited to breakdown of skin: Secondary | ICD-10-CM | POA: Diagnosis not present

## 2020-05-23 DIAGNOSIS — I480 Paroxysmal atrial fibrillation: Secondary | ICD-10-CM

## 2020-05-23 DIAGNOSIS — Z5181 Encounter for therapeutic drug level monitoring: Secondary | ICD-10-CM

## 2020-05-23 DIAGNOSIS — L97211 Non-pressure chronic ulcer of right calf limited to breakdown of skin: Secondary | ICD-10-CM | POA: Diagnosis not present

## 2020-05-23 DIAGNOSIS — I872 Venous insufficiency (chronic) (peripheral): Secondary | ICD-10-CM | POA: Diagnosis not present

## 2020-05-23 DIAGNOSIS — I152 Hypertension secondary to endocrine disorders: Secondary | ICD-10-CM | POA: Diagnosis not present

## 2020-05-23 DIAGNOSIS — E1151 Type 2 diabetes mellitus with diabetic peripheral angiopathy without gangrene: Secondary | ICD-10-CM | POA: Diagnosis not present

## 2020-05-23 LAB — POCT INR: INR: 1.8 — AB (ref 2.0–3.0)

## 2020-05-23 NOTE — Progress Notes (Signed)
LEAHA, CUERVO ([id number]) Visit Report for 05/22/2020 Arrival Information Details Patient Name: Date of Service: [name] 05/22/2020 11:00 A M Medical Record Number: [id number] Patient Account Number: [id number] Date of Birth/Sex: Treating RN: 02/19/1946 (74 y.o. Helene Shoe, Tammi Klippel Primary Care Carilyn Woolston: [name] Other Clinician: Referring Demetrice Amstutz: Treating Tyrell Seifer/Extender: [name] in Treatment: 11 Visit Information History Since Last Visit Added or deleted any medications: No Patient Arrived: Wheel Chair Any new allergies or adverse reactions: No Arrival Time: 11:38 Had a fall or experienced change in No Accompanied By: husband activities of daily living that may affect Transfer Assistance: None risk of falls: Patient Identification Verified: Yes Signs or symptoms of abuse/neglect since last visito No Secondary Verification Process Completed: Yes Hospitalized since last visit: No Patient Has Alerts: Yes Implantable device outside of the clinic excluding No Patient Alerts: Patient on Blood Thinner cellular tissue based products placed in the center unable to obtain ABI on R since last visit: Has Dressing in Place as Prescribed: Yes Pain Present Now: Yes Electronic Signature(s) Signed: 05/23/2020 8:27:14 AM By: Sandre Kitty Entered By: Sandre Kitty on 05/22/2020 11:38:51 -------------------------------------------------------------------------------- Encounter Discharge Information Details Patient Name: Date of Service: [name], DO RIS 05/22/2020 11:00 A M Medical Record Number: [id number] Patient Account Number: [id number] Date of Birth/Sex: Treating RN: [birth year] (74 y.o. Elam Dutch Primary Care Franco Duley: [name] Other Clinician: Referring Catarino Vold: Treating Trenton Passow/Extender: [name] in Treatment: 11 Encounter Discharge Information Items Post Procedure  Vitals Discharge Condition: Stable Temperature (F): 98.8 Ambulatory Status: Wheelchair Pulse (bpm): 71 Discharge Destination: Home Respiratory Rate (breaths/min): 18 Transportation: Private Auto Blood Pressure (mmHg): 124/75 Accompanied By: spouse Schedule Follow-up Appointment: Yes Clinical Summary of Care: Patient Declined Electronic Signature(s) Signed: 05/22/2020 5:54:34 PM By: Baruch Gouty RN, BSN Entered By: Baruch Gouty on 05/22/2020 14:59:58 -------------------------------------------------------------------------------- Lower Extremity Assessment Details Patient Name: Date of Service: [name], DO RIS 05/22/2020 11:00 A M Medical Record Number: [id number] Patient Account Number: [id number] Date of Birth/Sex: Treating RN: 18-Dec-1945 (74 y.o. Elam Dutch Primary Care Brittanyann Wittner: [name] Other Clinician: Referring Donatello Kleve: Treating Marysa Wessner/Extender: [name] in Treatment: 11 Edema Assessment Assessed: Shirlyn Goltz: No] Patrice Paradise: No] Edema: [Left: Yes] [Right: Yes] Calf Left: Right: Point of Measurement: 38 cm From Medial Instep 35 cm 37.4 cm Ankle Left: Right: Point of Measurement: 9 cm From Medial Instep 23.3 cm 23 cm Vascular Assessment Pulses: Dorsalis Pedis Palpable: [Left:No] [Right:No] Electronic Signature(s) Signed: 05/22/2020 5:54:34 PM By: Baruch Gouty RN, BSN Entered By: Baruch Gouty on 05/22/2020 11:52:19 -------------------------------------------------------------------------------- Multi Wound Chart Details Patient Name: Date of Service: [name], DO RIS 05/22/2020 11:00 A M Medical Record Number: [id number] Patient Account Number: [id number] Date of Birth/Sex: Treating RN: 1945-12-15 (74 y.o. Debby Bud Primary Care Starleen Trussell: [name] Other Clinician: Referring Glennie Rodda: Treating Isiaih Hollenbach/Extender: [name] in Treatment: 11 Vital  Signs Height(in): 60 Capillary Blood Glucose(mg/dl): 108 Weight(lbs): 160 Pulse(bpm): 53 Body Mass Index(BMI): 31 Blood Pressure(mmHg): 124/75 Temperature(F): 98.8 Respiratory Rate(breaths/min): 20 Photos: [1:No Photos Right, Posterior Lower Leg] [2:No Photos Left, Anterior Lower Leg] [3:No Photos Left, Proximal, Anterior Lower Leg] Wound Location: [1:Gradually Appeared] [2:Gradually Appeared] [3:Gradually Appeared] Wounding Event: [1:Diabetic Wound/Ulcer of the Lower] [2:Diabetic Wound/Ulcer of the Lower] [3:Diabetic Wound/Ulcer of the Lower] Primary Etiology: [1:Extremity Chronic Obstructive Pulmonary] [2:Extremity Chronic Obstructive Pulmonary] [3:Extremity Chronic Obstructive Pulmonary] Comorbid History: [1:Disease (COPD), Hypertension, Myocardial Infarction,  Type II Diabetes Myocardial Infarction, Type II Diabetes Myocardial Infarction, Type II Diabetes 01/17/2020] [2:Disease (COPD), Hypertension, 01/17/2020] [3:Disease (COPD),  Hypertension, 04/07/2020] Date Acquired: [1:11] [2:11] [3:5] Weeks of Treatment: [1:Open] [2:Open] [3:Open] Wound Status: [1:No] [2:No] [3:Yes] Clustered Wound: [1:N/A] [2:N/A] [3:3] Clustered Quantity: [1:0.6x0.6x0.2] [2:1.9x2x0.2] [3:3.9x2.3x0.3] Measurements L x W x D (cm) [1:0.283] [2:2.985] [3:7.045] A (cm) : rea [1:0.057] [2:0.597] [3:2.114] Volume (cm) : [1:-44.40%] [2:-245.50%] [3:-12.10%] % Reduction in A rea: [1:-46.20%] [2:-594.20%] [3:-236.60%] % Reduction in Volume: [1:Grade 2] [2:Grade 2] [3:Grade 2] Classification: [1:Small] [2:Small] [3:Small] Exudate A mount: [1:Serous] [2:Serous] [3:Serous] Exudate Type: [1:amber] [2:amber] [3:amber] Exudate Color: [1:Distinct, outline attached] [2:Distinct, outline attached] [3:Distinct, outline attached] Wound Margin: [1:Small (1-33%)] [2:Small (1-33%)] [3:None Present (0%)] Granulation A mount: [1:Pink] [2:Pink] [3:N/A] Granulation Quality: [1:Large (67-100%)] [2:Large (67-100%)] [3:Large  (67-100%)] Necrotic A mount: [1:Adherent Slough] [2:Adherent Slough] [3:Eschar, Adherent Slough] Necrotic Tissue: [1:Fat Layer (Subcutaneous Tissue): Yes Fat Layer (Subcutaneous Tissue): Yes Fat Layer (Subcutaneous Tissue): Yes] Exposed Structures: [1:Fascia: No Tendon: No Muscle: No Joint: No Bone: No None] [2:Fascia: No Tendon: No Muscle: No Joint: No Bone: No None] [3:Fascia: No Tendon: No Muscle: No Joint: No Bone: No None] Wound Number: 4 N/A N/A Photos: No Photos N/A N/A Left, Posterior Lower Leg N/A N/A Wound Location: Gradually Appeared N/A N/A Wounding Event: Diabetic Wound/Ulcer of the Lower N/A N/A Primary Etiology: Extremity Chronic Obstructive Pulmonary N/A N/A Comorbid History: Disease (COPD), Hypertension, Myocardial Infarction, Type II Diabetes 04/07/2020 N/A N/A Date Acquired: 5 N/A N/A Weeks of Treatment: Open N/A N/A Wound Status: No N/A N/A Clustered Wound: N/A N/A N/A Clustered Quantity: 0.5x0.5x0.1 N/A N/A Measurements L x W x D (cm) 0.196 N/A N/A A (cm) : rea 0.02 N/A N/A Volume (cm) : 0.00% N/A N/A % Reduction in A rea: 0.00% N/A N/A % Reduction in Volume: Grade 2 N/A N/A Classification: Small N/A N/A Exudate A mount: Serous N/A N/A Exudate Type: amber N/A N/A Exudate Color: Distinct, outline attached N/A N/A Wound Margin: Small (1-33%) N/A N/A Granulation A mount: Pink N/A N/A Granulation Quality: Large (67-100%) N/A N/A Necrotic A mount: Adherent Slough N/A N/A Necrotic Tissue: Fat Layer (Subcutaneous Tissue): Yes N/A N/A Exposed Structures: Fascia: No Tendon: No Muscle: No Joint: No Bone: No None N/A N/A Epithelialization: Treatment Notes Electronic Signature(s) Signed: 05/22/2020 5:37:49 PM By: Linton Ham MD Signed: 05/22/2020 6:08:00 PM By: Deon Pilling Entered By: Linton Ham on 05/22/2020 13:10:47 -------------------------------------------------------------------------------- Multi-Disciplinary Care Plan  Details Patient Name: Date of Service: [name], DO RIS 05/22/2020 11:00 A M Medical Record Number: [id number] Patient Account Number: [id number] Date of Birth/Sex: Treating RN: [birth year] (74 y.o. Debby Bud Primary Care Meckenzie Balsley: Other Clinician: [name] Referring Suhail Peloquin: Treating Deairra Halleck/Extender: [name] in Treatment: 11 Active Inactive Wound/Skin Impairment Nursing Diagnoses: Impaired tissue integrity Knowledge deficit related to ulceration/compromised skin integrity Goals: Patient/caregiver will verbalize understanding of skin care regimen Date Initiated: 04/14/2020 Target Resolution Date: 07/18/2020 Goal Status: Active Interventions: Assess patient/caregiver ability to obtain necessary supplies Assess patient/caregiver ability to perform ulcer/skin care regimen upon admission and as needed Assess ulceration(s) every visit Provide education on ulcer and skin care Notes: Electronic Signature(s) Signed: 05/22/2020 6:08:00 PM By: Deon Pilling Entered By: Deon Pilling on 05/22/2020 11:20:47 -------------------------------------------------------------------------------- Pain Assessment Details Patient Name: Date of Service: [name], DO RIS 05/22/2020 11:00 A M Medical Record Number: [id number] Patient Account Number: [id number] Date of Birth/Sex: Treating RN: 12-16-1945 (74 y.o. Debby Bud Primary Care  Tenise Stetler: [name] Other Clinician: Referring Jewell Haught: Treating Tailynn Armetta/Extender: [name] in Treatment: 11 Active Problems Location of Pain Severity and Description of Pain Patient Has Paino Yes Site Locations Rate the pain. Current Pain Level: 9 Pain Management and Medication Current Pain Management: Electronic Signature(s) Signed: 05/22/2020 6:08:00 PM By: Deon Pilling Signed: 05/23/2020 8:27:14 AM By: Sandre Kitty Entered By: Sandre Kitty on  05/22/2020 11:41:00 -------------------------------------------------------------------------------- Patient/Caregiver Education Details Patient Name: Date of Service: [name], DO RIS 11/4/2021andnbsp11:00 A M Medical Record Number: [id number] Patient Account Number: [id number] Date of Birth/Gender: Treating RN: February 14, 1946 (74 y.o. Debby Bud Primary Care Physician: [name] Other Clinician: Referring Physician: Treating Physician/Extender: [name] in Treatment: 11 Education Assessment Education Provided To: Patient Education Topics Provided Wound/Skin Impairment: Handouts: Skin Care Do's and Dont's Methods: Explain/Verbal Responses: Reinforcements needed Electronic Signature(s) Signed: 05/22/2020 6:08:00 PM By: Deon Pilling Entered By: Deon Pilling on 05/22/2020 11:21:03 -------------------------------------------------------------------------------- Wound Assessment Details Patient Name: Date of Service: [name], DO RIS 05/22/2020 11:00 A M Medical Record Number: [id number] Patient Account Number: [id number] Date of Birth/Sex: Treating RN: [birth year] (74 y.o. Elam Dutch Primary Care Lovenia Debruler: [name] Other Clinician: Referring Cacie Gaskins: Treating Shoshannah Faubert/Extender: [name] in Treatment: 11 Wound Status Wound Number: 1 Primary Diabetic Wound/Ulcer of the Lower Extremity Etiology: Wound Location: Right, Posterior Lower Leg Wound Open Wounding Event: Gradually Appeared Status: Date Acquired: 01/17/2020 Comorbid Chronic Obstructive Pulmonary Disease (COPD), Hypertension, Weeks Of Treatment: 11 History: Myocardial Infarction, Type II Diabetes Clustered Wound: No Wound Measurements Length: (cm) 0.6 Width: (cm) 0.6 Depth: (cm) 0.2 Area: (cm) 0.283 Volume: (cm) 0.057 % Reduction in Area: -44.4% % Reduction in Volume: -46.2% Epithelialization:  None Tunneling: No Undermining: No Wound Description Classification: Grade 2 Wound Margin: Distinct, outline attached Exudate Amount: Small Exudate Type: Serous Exudate Color: amber Foul Odor After Cleansing: No Slough/Fibrino Yes Wound Bed Granulation Amount: Small (1-33%) Exposed Structure Granulation Quality: Pink Fascia Exposed: No Necrotic Amount: Large (67-100%) Fat Layer (Subcutaneous Tissue) Exposed: Yes Necrotic Quality: Adherent Slough Tendon Exposed: No Muscle Exposed: No Joint Exposed: No Bone Exposed: No Treatment Notes Wound #1 (Right, Posterior Lower Leg) 3. Primary Dressing Applied Santyl 4. Secondary Dressing ABD Pad Dry Gauze Roll Gauze 5. Secured With Tape Notes Horticulturist, commercial) Signed: 05/22/2020 5:54:34 PM By: Baruch Gouty RN, BSN Entered By: Baruch Gouty on 05/22/2020 11:47:07 -------------------------------------------------------------------------------- Wound Assessment Details Patient Name: Date of Service: [name], DO RIS 05/22/2020 11:00 A M Medical Record Number: [id number] Patient Account Number: [id number] Date of Birth/Sex: Treating RN: [birth year] (74 y.o. Elam Dutch Primary Care Belle Charlie: [name] Other Clinician: Referring Mindie Rawdon: Treating Ahliya Glatt/Extender: [name] in Treatment: 11 Wound Status Wound Number: 2 Primary Diabetic Wound/Ulcer of the Lower Extremity Etiology: Wound Location: Left, Anterior Lower Leg Wound Open Wounding Event: Gradually Appeared Status: Date Acquired: 01/17/2020 Comorbid Chronic Obstructive Pulmonary Disease (COPD), Hypertension, Weeks Of Treatment: 11 History: Myocardial Infarction, Type II Diabetes Clustered Wound: No Wound Measurements Length: (cm) 1.9 Width: (cm) 2 Depth: (cm) 0.2 Area: (cm) 2.985 Volume: (cm) 0.597 % Reduction in Area: -245.5% % Reduction in Volume: -594.2% Epithelialization:  None Tunneling: No Undermining: No Wound Description Classification: Grade 2 Wound Margin: Distinct, outline attached Exudate Amount: Small Exudate Type: Serous Exudate Color: amber Wound Bed Granulation Amount: Small (1-33%) Granulation Quality: Pink Necrotic Amount: Large (67-100%) Necrotic Quality: Adherent [employer]  After Cleansing: No Slough/Fibrino Yes Exposed Structure Fascia Exposed: No Fat Layer (Subcutaneous Tissue) Exposed: Yes Tendon Exposed: No Muscle Exposed: No Joint Exposed: No Bone Exposed: No Treatment Notes Wound #2 (Left, Anterior Lower Leg) 3. Primary Dressing Applied Santyl 4. Secondary Dressing ABD Pad Dry Gauze Roll Gauze 5. Secured With Tape Notes Horticulturist, commercial) Signed: 05/22/2020 5:54:34 PM By: Baruch Gouty RN, BSN Entered By: Baruch Gouty on 05/22/2020 11:50:00 -------------------------------------------------------------------------------- Wound Assessment Details Patient Name: Date of Service: [name], DO RIS 05/22/2020 11:00 A M Medical Record Number: [id number] Patient Account Number: [id number] Date of Birth/Sex: Treating RN: [birth year] (74 y.o. Elam Dutch Primary Care Nakyla Bracco: [name] Other Clinician: Referring Ariza Evans: Treating Miroslav Gin/Extender: [name] in Treatment: 11 Wound Status Wound Number: 3 Primary Diabetic Wound/Ulcer of the Lower Extremity Etiology: Wound Location: Left, Proximal, Anterior Lower Leg Wound Open Wounding Event: Gradually Appeared Status: Date Acquired: 04/07/2020 Comorbid Chronic Obstructive Pulmonary Disease (COPD), Hypertension, Weeks Of Treatment: 5 History: Myocardial Infarction, Type II Diabetes Clustered Wound: Yes Wound Measurements Length: (cm) 3.9 Width: (cm) 2.3 Depth: (cm) 0.3 Clustered Quantity: 3 Area: (cm) 7.045 Volume: (cm) 2.114 % Reduction in Area: -12.1% % Reduction in Volume:  -236.6% Epithelialization: None Tunneling: No Undermining: No Wound Description Classification: Grade 2 Wound Margin: Distinct, outline attached Exudate Amount: Small Exudate Type: Serous Exudate Color: amber Foul Odor After Cleansing: No Slough/Fibrino Yes Wound Bed Granulation Amount: None Present (0%) Exposed Structure Necrotic Amount: Large (67-100%) Fascia Exposed: No Necrotic Quality: Eschar, Adherent Slough Fat Layer (Subcutaneous Tissue) Exposed: Yes Tendon Exposed: No Muscle Exposed: No Joint Exposed: No Bone Exposed: No Treatment Notes Wound #3 (Left, Proximal, Anterior Lower Leg) 3. Primary Dressing Applied Santyl 4. Secondary Dressing ABD Pad Dry Gauze Roll Gauze 5. Secured With Tape Notes Horticulturist, commercial) Signed: 05/22/2020 5:54:34 PM By: Baruch Gouty RN, BSN Entered By: Baruch Gouty on 05/22/2020 11:48:38 -------------------------------------------------------------------------------- Wound Assessment Details Patient Name: Date of Service: [name], DO RIS 05/22/2020 11:00 A M Medical Record Number: [id number] Patient Account Number: [id number] Date of Birth/Sex: Treating RN: 02/21/1946 (74 y.o. Elam Dutch Primary Care Ulice Follett: [name] Other Clinician: Referring Quaron Delacruz: Treating Jet Armbrust/Extender: [name] in Treatment: 11 Wound Status Wound Number: 4 Primary Diabetic Wound/Ulcer of the Lower Extremity Etiology: Wound Location: Left, Posterior Lower Leg Wound Open Wounding Event: Gradually Appeared Status: Date Acquired: 04/07/2020 Comorbid Chronic Obstructive Pulmonary Disease (COPD), Hypertension, Weeks Of Treatment: 5 History: Myocardial Infarction, Type II Diabetes Clustered Wound: No Wound Measurements Length: (cm) 0.5 Width: (cm) 0.5 Depth: (cm) 0.1 Area: (cm) 0.196 Volume: (cm) 0.02 % Reduction in Area: 0% % Reduction in Volume: 0% Epithelialization:  None Tunneling: No Undermining: No Wound Description Classification: Grade 2 Wound Margin: Distinct, outline attached Exudate Amount: Small Exudate Type: Serous Exudate Color: amber Foul Odor After Cleansing: No Slough/Fibrino Yes Wound Bed Granulation Amount: Small (1-33%) Exposed Structure Granulation Quality: Pink Fascia Exposed: No Necrotic Amount: Large (67-100%) Fat Layer (Subcutaneous Tissue) Exposed: Yes Necrotic Quality: Adherent Slough Tendon Exposed: No Muscle Exposed: No Joint Exposed: No Bone Exposed: No Treatment Notes Wound #4 (Left, Posterior Lower Leg) 3. Primary Dressing Applied Santyl 4. Secondary Dressing ABD Pad Dry Gauze Roll Gauze 5. Secured With Tape Notes Horticulturist, commercial) Signed: 05/22/2020 5:54:34 PM By: Baruch Gouty RN, BSN Entered By: Baruch Gouty on 05/22/2020 11:49:03 -------------------------------------------------------------------------------- Yellow Medicine Details Patient Name: Date of Service: [name], DO RIS 05/22/2020 11:00 A  M Medical Record Number: [id number] Patient Account Number: [id number] Date of Birth/Sex: Treating RN: 1945-10-16 (74 y.o. Helene Shoe, Meta.Reding Primary Care Jerine Surles: [name] Other Clinician: Referring Brice Kossman: Treating Kathrina Crosley/Extender: [name] in Treatment: 11 Vital Signs Time Taken: 11:40 Temperature (F): 98.8 Height (in): 60 Pulse (bpm): 71 Weight (lbs): 160 Respiratory Rate (breaths/min): 20 Body Mass Index (BMI): 31.2 Blood Pressure (mmHg): 124/75 Capillary Blood Glucose (mg/dl): 108 Reference Range: 80 - 120 mg / dl Electronic Signature(s) Signed: 05/23/2020 8:27:14 AM By: Sandre Kitty Entered By: Sandre Kitty on 05/22/2020 11:41:09

## 2020-05-29 ENCOUNTER — Other Ambulatory Visit: Payer: Self-pay

## 2020-05-29 ENCOUNTER — Other Ambulatory Visit (HOSPITAL_COMMUNITY)
Admission: RE | Admit: 2020-05-29 | Discharge: 2020-05-29 | Disposition: A | Discharge status: Home / Self Care | Payer: Medicare Other | Source: Ambulatory Visit | Attending: Vascular Surgery | Admitting: Vascular Surgery

## 2020-05-29 ENCOUNTER — Encounter (HOSPITAL_BASED_OUTPATIENT_CLINIC_OR_DEPARTMENT_OTHER): Payer: Medicare Other | Admitting: Internal Medicine

## 2020-05-29 DIAGNOSIS — L97222 Non-pressure chronic ulcer of left calf with fat layer exposed: Secondary | ICD-10-CM | POA: Diagnosis not present

## 2020-05-29 DIAGNOSIS — E11622 Type 2 diabetes mellitus with other skin ulcer: Secondary | ICD-10-CM | POA: Diagnosis not present

## 2020-05-29 DIAGNOSIS — Z20822 Contact with and (suspected) exposure to covid-19: Secondary | ICD-10-CM | POA: Insufficient documentation

## 2020-05-29 DIAGNOSIS — Z01812 Encounter for preprocedural laboratory examination: Secondary | ICD-10-CM | POA: Insufficient documentation

## 2020-05-29 DIAGNOSIS — L97211 Non-pressure chronic ulcer of right calf limited to breakdown of skin: Secondary | ICD-10-CM | POA: Diagnosis not present

## 2020-05-29 DIAGNOSIS — E1122 Type 2 diabetes mellitus with diabetic chronic kidney disease: Secondary | ICD-10-CM | POA: Diagnosis not present

## 2020-05-29 DIAGNOSIS — L97822 Non-pressure chronic ulcer of other part of left lower leg with fat layer exposed: Secondary | ICD-10-CM | POA: Diagnosis not present

## 2020-05-29 DIAGNOSIS — L97212 Non-pressure chronic ulcer of right calf with fat layer exposed: Secondary | ICD-10-CM | POA: Diagnosis not present

## 2020-05-29 DIAGNOSIS — L97828 Non-pressure chronic ulcer of other part of left lower leg with other specified severity: Secondary | ICD-10-CM | POA: Diagnosis not present

## 2020-05-29 DIAGNOSIS — L97218 Non-pressure chronic ulcer of right calf with other specified severity: Secondary | ICD-10-CM | POA: Diagnosis not present

## 2020-05-29 DIAGNOSIS — I130 Hypertensive heart and chronic kidney disease with heart failure and stage 1 through stage 4 chronic kidney disease, or unspecified chronic kidney disease: Secondary | ICD-10-CM | POA: Diagnosis not present

## 2020-05-29 LAB — SARS CORONAVIRUS 2 (TAT 6-24 HRS): SARS Coronavirus 2: NEGATIVE

## 2020-05-29 NOTE — Progress Notes (Signed)
[name] ([id number]) Visit Report for 05/29/2020 Debridement Details Patient Name: Date of Service: [name] RIS 05/29/2020 9:15 A M Medical Record Number: [id number] Patient Account Number: [id number] Date of Birth/Sex: Treating RN: Jun 02, 1946 (74 y.o. [name] Primary Care Provider: [name] Other Clinician: Referring Provider: Treating Provider/Extender: [name] in Treatment: 12 Debridement Performed for Assessment: Wound #1 Right,Posterior Lower Leg Performed By: Clinician Baruch Gouty, RN Debridement Type: Chemical/Enzymatic/Mechanical Agent Used: Santyl Severity of Tissue Pre Debridement: Fat layer exposed Level of Consciousness (Pre-procedure): Awake and Alert Pre-procedure Verification/Time Out Yes - 10:10 Taken: Bleeding: None Response to Treatment: Procedure was tolerated well Level of Consciousness (Post- Awake and Alert procedure): Post Debridement Measurements of Total Wound Length: (cm) 0.7 Width: (cm) 0.8 Depth: (cm) 0.3 Volume: (cm) 0.132 Character of Wound/Ulcer Post Debridement: Requires Further Debridement Severity of Tissue Post Debridement: Fat layer exposed Post Procedure Diagnosis Same as Pre-procedure Electronic Signature(s) Signed: 05/29/2020 5:00:15 PM By: Linton Ham MD Signed: 05/29/2020 5:09:44 PM By: Deon Pilling Entered By: Deon Pilling on 05/29/2020 10:14:23 -------------------------------------------------------------------------------- Debridement Details Patient Name: Date of Service: [name] Mode, DO RIS 05/29/2020 9:15 A M Medical Record Number: [id number] Patient Account Number: [id number] Date of Birth/Sex: Treating RN: June 17, 1946 (74 y.o. [name] Primary Care Provider: [name] Other Clinician: Referring Provider: Treating Provider/Extender: [name] in Treatment: 12 Debridement Performed for Assessment:  Wound #2 Left,Anterior Lower Leg Performed By: Clinician Baruch Gouty, RN Debridement Type: Chemical/Enzymatic/Mechanical Agent Used: Santyl Severity of Tissue Pre Debridement: Fat layer exposed Level of Consciousness (Pre-procedure): Awake and Alert Pre-procedure Verification/Time Out Yes - 10:10 Taken: Bleeding: None Response to Treatment: Procedure was tolerated well Level of Consciousness (Post- Awake and Alert Awake and Alert procedure): Post Debridement Measurements of Total Wound Length: (cm) 1.7 Width: (cm) 2.1 Depth: (cm) 0.3 Volume: (cm) 0.841 Character of Wound/Ulcer Post Debridement: Requires Further Debridement Severity of Tissue Post Debridement: Fat layer exposed Post Procedure Diagnosis Same as Pre-procedure Electronic Signature(s) Signed: 05/29/2020 5:00:15 PM By: Linton Ham MD Signed: 05/29/2020 5:09:44 PM By: Deon Pilling Entered By: Deon Pilling on 05/29/2020 10:15:00 -------------------------------------------------------------------------------- Debridement Details Patient Name: Date of Service: [name] Mode, DO RIS 05/29/2020 9:15 A M Medical Record Number: [id number] Patient Account Number: [id number] Date of Birth/Sex: Treating RN: Jul 30, 1945 (74 y.o. [name] Primary Care Provider: [name] Other Clinician: Referring Provider: Treating Provider/Extender: [name] in Treatment: 12 Debridement Performed for Assessment: Wound #3 Left,Proximal,Anterior Lower Leg Performed By: Clinician Baruch Gouty, RN Debridement Type: Chemical/Enzymatic/Mechanical Agent Used: Santyl Severity of Tissue Pre Debridement: Fat layer exposed Level of Consciousness (Pre-procedure): Awake and Alert Pre-procedure Verification/Time Out Yes - 10:10 Taken: Bleeding: None Response to Treatment: Procedure was tolerated well Level of Consciousness (Post- Awake and Alert procedure): Post Debridement Measurements  of Total Wound Length: (cm) 4.1 Width: (cm) 2.5 Depth: (cm) 0.4 Volume: (cm) 3.22 Character of Wound/Ulcer Post Debridement: Requires Further Debridement Severity of Tissue Post Debridement: Fat layer exposed Post Procedure Diagnosis Same as Pre-procedure Electronic Signature(s) Signed: 05/29/2020 5:00:15 PM By: Linton Ham MD Signed: 05/29/2020 5:09:44 PM By: Deon Pilling Entered By: Deon Pilling on 05/29/2020 10:16:08 -------------------------------------------------------------------------------- Debridement Details Patient Name: Date of Service: [name] Mode, DO RIS 05/29/2020 9:15 A M Medical Record Number: [id number] Patient Account Number: [id number] Date of Birth/Sex: Treating RN: Jan 12, 1946 (74 y.o. [name] Primary Care Provider: [name] Other Clinician: Referring Provider:  Treating Provider/Extender: [name] in Treatment: 12 Debridement Performed for Assessment: Wound #4 Left,Posterior Lower Leg Performed By: Clinician Baruch Gouty, RN Debridement Type: Chemical/Enzymatic/Mechanical Agent Used: Santyl Severity of Tissue Pre Debridement: Fat layer exposed Level of Consciousness (Pre-procedure): Awake and Alert Pre-procedure Verification/Time Out Yes - 10:10 Taken: Bleeding: None Response to Treatment: Procedure was tolerated well Level of Consciousness (Post- Awake and Alert procedure): Post Debridement Measurements of Total Wound Length: (cm) 0.4 Width: (cm) 0.4 Depth: (cm) 0.2 Volume: (cm) 0.025 Character of Wound/Ulcer Post Debridement: Requires Further Debridement Severity of Tissue Post Debridement: Fat layer exposed Post Procedure Diagnosis Same as Pre-procedure Electronic Signature(s) Signed: 05/29/2020 5:00:15 PM By: Linton Ham MD Signed: 05/29/2020 5:09:44 PM By: Deon Pilling Entered By: Deon Pilling on 05/29/2020  10:16:25 -------------------------------------------------------------------------------- HPI Details Patient Name: Date of Service: [name] Mode, DO RIS 05/29/2020 9:15 A M Medical Record Number: [id number] Patient Account Number: [id number] Date of Birth/Sex: Treating RN: January 20, 1946 (74 y.o. [name], [name] Primary Care Provider: [name] Other Clinician: Referring Provider: Treating Provider/Extender: [name] in Treatment: 12 History of Present Illness HPI Description: 74 year old female with bilateral leg wounds one on the left anterior leg and 1 right posterior leg for about 6 weeks, lately she has noted excessive drainage that soaks towels at night by the time she wakes up in the morning, especially from the left shin wound. Patient stated this started out as pinholes and became bigger. She had a couple 1 on the left and one on the right more distal near the feet close up on their own. She has been using Neosporin and clobetasol cream to the area and using gauze dressing. ABIs today right unobtainable, left 0.8, Interestingly her ABIs in 2018 were 0.47 on the right and 0.71 on the left, Performed by Dr. Earleen Newport Patient's history is complex with post renal transplant and kidney disease stage IV, history of lung cancer status post XRT insulin requiring diabetes, chronic A. , fib status post Maze procedure, diastolic heart failure, A. fib on Coumadin, aortic stenosis status post AVR, OSA without CPAP use. Patient denies any fevers chills shakes, has significant degree of pain in the shin area and the wound and has difficulty having any pressure on it 8/27; patient was admitted to clinic on 8/13. She has punched out areas on the left lateral and right lateral. Neither 1 of these have viable surfaces. We used silver alginate on them when she first came in she has 3+ pitting edema bilaterally in her lower extremities and some degree of PAD. We ordered  arterial studies on her last time I do not know that those have been done. The patient complains of pain in the legs stating the wraps were too tight. She has home health I think changing the dressings. She says she was in the ER on Monday because of pain. She has chronic kidney disease stage IV and is on very high doses of torsemide Addendum; after patient left the clinic were able to resurrect her arterial studies from 8/23. This showed on the right and ABI 1.58 on IV noncompressible waveforms were monophasic great toe waveform was absent. On the left ABI at 1.58 again great toe waveform was absent. Monophasic waveforms 9/27; this is a patient that was admitted to the clinic in August by Dr. Evette Doffing. I saw her once on 8/27. She has ischemic wounds on her bilateral lower extremities. Her noninvasive studies were quoted above. She saw  Dr. [name] on 04/10/2020 am not exactly sure who made this appointment. She tells [place] that she is in a lot of pain 9-1/2 out of 10 can only walk minimal distances at home. She has severe chronic renal failure status post renal transplant. She has a shunt in her left arm. She also has chronic atrial fibrillation on Coumadin. Dr. [name] felt she had slowly healing wounds I am not exactly sure these are healing. Dr. [name] felt that any attempt at imaging her would put her into dialysis range chronic renal failure which is probably accurate.. It was she was not felt to be a candidate for an open operation but might be a percutaneous revascularization. She has 1 on the right lateral lower leg and area on the left lateral. These are punched out ischemic looking. She had a new painful swelling on the left anterior. I unroofed some eschar on this to expose copious amounts of pus which I have obtained for culture. She has been on Keflex I think that it Dr. [name] actually prescribed and I have continued this today pending culture of the abscess. She is allergic to Bactrim  and doxycycline 10/5; the patient is not doing well. The small abscess that I noted last week on the left anterior lower leg just above her original wound grew Enterobacter cloacae I. This was resistant to cefazolin I therefore have her on cefdinir. Is today with a larger necrotic wound on her left anterior tibia where I cultured last time the other wounds on the left lower anterior tibia and the right lateral calf are about the same. Nonviable surface I think the patient is really in a lot of pain. I think this is claudication at rest for the most part. She is still not really willing to take the risk of an angiogram until her kidney function improves although I am not really sure where we are with that she follows with nephrology as well. She does have a shunt in her left arm The last lab work I see was in late August. At which time her creatinine was 2.90 estimated GFR of 17. However her glucose at that time was 700. She says she is hoping that her creatinine comes down/improves. I am not sure where we are with that. I cannot see her nephrologist notes 10/18; 2-week follow-up. The patient is not doing well. 2 necrotic wounds on the left anterior tibia. And a small punched-out area on the left posterior calf. We are using Iodoflex to the wound areas. She is having a lot of pain I think this is claudication. She sees her nephrologist tomorrow she was reluctant to even consider angiography until she talk to Dr. Joelyn Oms who is her nephrologist. I think she has a follow-up appointment with Dr. [name] later this month on 10/29 11th the patient continues not to do well. A lot of pain. 2 large punched out necrotic areas on the left anterior leg 2 small areas on the left posterior and right posterior calf. She does not keep compression wraps on we are using kerlix and light Coban she takes these off within 12 hours therefore there is little point in doing this. I think she also has chronic venous  insufficiency. She follows with Dr. [name] this afternoon unfortunately I think this patient is going to come down to a risky attempt to revascularize her vis--vis her stage IV chronic renal failure versus perhaps an amputation. Fairly clear that the ischemic pain is wearing her down 11/11; the  patient saw Dr. [name] on 11/4. The patient underwent a discussion with Dr. [name] that was for cast including primary amputation versus arteriogram with the possibility of acute on severe chronic renal failure necessitating dialysis. She opted for the arteriogram which I think is much as possible is going to be done with CO2 however she may need to get some contrast. This is booked for tomorrow. We have been using Santyl to the wounds which are painful. She tells me she is very much resistant to the idea of amputation. Electronic Signature(s) Signed: 05/29/2020 5:00:15 PM By: Linton Ham MD Entered By: Linton Ham on 05/29/2020 10:20:37 -------------------------------------------------------------------------------- Physical Exam Details Patient Name: Date of Service: [name] Mode, DO RIS 05/29/2020 9:15 A M Medical Record Number: [id number] Patient Account Number: [id number] Date of Birth/Sex: Treating RN: Feb 23, 1946 (74 y.o. [name] Primary Care Provider: [name] Other Clinician: Referring Provider: Treating Provider/Extender: [name] in Treatment: 12 Constitutional Sitting or standing Blood Pressure is within target range for patient.. Pulse regular and within target range for patient.Marland Kitchen Respirations regular, non-labored and within target range.. Temperature is normal and within the target range for the patient.Marland Kitchen Appears in no distress. Cardiovascular Pedal pulses absent bilaterally.. There is some edema in her legs. Notes Wound exam; the left anterior tibia 2 punched-out wounds with necrotic surfaces they actually look somewhat better.  No attempted debridement. She also has a smaller area on the left posterior medial calf and the right calf. These are small but necrotic covered as well. All of this is very painful. Electronic Signature(s) Signed: 05/29/2020 5:00:15 PM By: Linton Ham MD Entered By: Linton Ham on 05/29/2020 10:22:21 -------------------------------------------------------------------------------- Physician Orders Details Patient Name: Date of Service: [name] Mode, DO RIS 05/29/2020 9:15 A M Medical Record Number: [id number] Patient Account Number: [id number] Date of Birth/Sex: Treating RN: 1945-12-10 (74 y.o. [name], [name] Primary Care Provider: Other Clinician: [name] Referring Provider: Treating Provider/Extender: [name] in Treatment: 12 Verbal / Phone Orders: No Diagnosis Coding ICD-10 Coding Code Description U93.235 Non-pressure chronic ulcer of right calf limited to breakdown of skin L97.828 Non-pressure chronic ulcer of other part of left lower leg with other specified severity N18.4 Chronic kidney disease, stage 4 (severe) E11.22 Type 2 diabetes mellitus with diabetic chronic kidney disease L02.416 Cutaneous abscess of left lower limb L97.218 Non-pressure chronic ulcer of right calf with other specified severity Follow-up Appointments Return appointment in 3 weeks. - Thursday 06/19/2020 Dressing Change Frequency Change dressing every day. - twice a week by home health all other days my patient or family. Wound Cleansing Clean wound with Normal Saline. - or wound cleanser Primary Wound Dressing Wound #1 Right,Posterior Lower Leg Santyl Ointment - apply directly to wound bed. Wound #2 Left,Anterior Lower Leg Santyl Ointment - apply directly to wound bed. Wound #3 Left,Proximal,Anterior Lower Leg Santyl Ointment - apply directly to wound bed. Wound #4 Left,Posterior Lower Leg Santyl Ointment - apply directly to wound  bed. Secondary Dressing Kerlix/Rolled Gauze ABD pad Edema Control Avoid standing for long periods of time Elevate legs to the level of the heart or above for 30 minutes daily and/or when sitting, a frequency of: - throughout the day [facility] for wound care. - [facility] Patient Medications llergies: ibuprofen, Sulfa (Sulfonamide Antibiotics), tramadol, doxycycline, Hydrocil, Bactrim, red dye A Notifications Medication Indication Start End 05/29/2020 Santyl DOSE topical 250 unit/gram ointment - ointment topical to wound  change daily (continuing rx) Electronic Signature(s) Signed: 05/29/2020 10:24:14 AM By: Linton Ham MD Entered By: Linton Ham on 05/29/2020 10:24:13 -------------------------------------------------------------------------------- Problem List Details Patient Name: Date of Service: [name] Mode, DO RIS 05/29/2020 9:15 A M Medical Record Number: [id number] Patient Account Number: [id number] Date of Birth/Sex: Treating RN: 30-Sep-1945 (74 y.o. [name] Primary Care Provider: Other Clinician: [name] Referring Provider: Treating Provider/Extender: [name] in Treatment: 12 Active Problems ICD-10 Encounter Code Description Active Date MDM Diagnosis L97.211 Non-pressure chronic ulcer of right calf limited to breakdown of skin 02/29/2020 No Yes L97.828 Non-pressure chronic ulcer of other part of left lower leg with other specified 05/05/2020 No Yes severity N18.4 Chronic kidney disease, stage 4 (severe) 02/29/2020 No Yes E11.22 Type 2 diabetes mellitus with diabetic chronic kidney disease 02/29/2020 No Yes L02.416 Cutaneous abscess of left lower limb 04/14/2020 No Yes L97.218 Non-pressure chronic ulcer of right calf with other specified severity 05/05/2020 No Yes E11.51 Type 2 diabetes mellitus with diabetic peripheral angiopathy without gangrene 05/29/2020 No  Yes Inactive Problems ICD-10 Code Description Active Date Inactive Date B34.19 Chronic diastolic (congestive) heart failure 02/29/2020 02/29/2020 L97.221 Non-pressure chronic ulcer of left calf limited to breakdown of skin 02/29/2020 02/29/2020 Resolved Problems Electronic Signature(s) Signed: 05/29/2020 5:00:15 PM By: Linton Ham MD Entered By: Linton Ham on 05/29/2020 10:18:24 -------------------------------------------------------------------------------- Progress Note Details Patient Name: Date of Service: [name] Mode, DO RIS 05/29/2020 9:15 A M Medical Record Number: [id number] Patient Account Number: [id number] Date of Birth/Sex: Treating RN: [birth year] (74 y.o. [name], [name] Primary Care Provider: [name] Other Clinician: Referring Provider: Treating Provider/Extender: [name] in Treatment: 12 Subjective History of Present Illness (HPI) 74 year old female with bilateral leg wounds one on the left anterior leg and 1 right posterior leg for about 6 weeks, lately she has noted excessive drainage that soaks towels at night by the time she wakes up in the morning, especially from the left shin wound. Patient stated this started out as pinholes and became bigger. She had a couple 1 on the left and one on the right more distal near the feet close up on their own. She has been using Neosporin and clobetasol cream to the area and using gauze dressing. ABIs today right unobtainable, left 0.8, Interestingly her ABIs in 2018 were 0.47 on the right and 0.71 on the left, Performed by Dr. Earleen Newport Patient's history is complex with post renal transplant and kidney disease stage IV, history of lung cancer status post XRT insulin requiring diabetes, chronic A. , fib status post Maze procedure, diastolic heart failure, A. fib on Coumadin, aortic stenosis status post AVR, OSA without CPAP use. Patient denies any fevers chills shakes, has  significant degree of pain in the shin area and the wound and has difficulty having any pressure on it 8/27; patient was admitted to clinic on 8/13. She has punched out areas on the left lateral and right lateral. Neither 1 of these have viable surfaces. We used silver alginate on them when she first came in she has 3+ pitting edema bilaterally in her lower extremities and some degree of PAD. We ordered arterial studies on her last time I do not know that those have been done. The patient complains of pain in the legs stating the wraps were too tight. She has home health I think changing the dressings. She says she was in the ER on Monday because of pain. She has chronic  kidney disease stage IV and is on very high doses of torsemide Addendum; after patient left the clinic were able to resurrect her arterial studies from 8/23. This showed on the right and ABI 1.58 on IV noncompressible waveforms were monophasic great toe waveform was absent. On the left ABI at 1.58 again great toe waveform was absent. Monophasic waveforms 9/27; this is a patient that was admitted to the clinic in August by Dr. Evette Doffing. I saw her once on 8/27. She has ischemic wounds on her bilateral lower extremities. Her noninvasive studies were quoted above. She saw Dr. [name] on 04/10/2020 am not exactly sure who made this appointment. She tells [place] that she is in a lot of pain 9-1/2 out of 10 can only walk minimal distances at home. She has severe chronic renal failure status post renal transplant. She has a shunt in her left arm. She also has chronic atrial fibrillation on Coumadin. Dr. [name] felt she had slowly healing wounds I am not exactly sure these are healing. Dr. [name] felt that any attempt at imaging her would put her into dialysis range chronic renal failure which is probably accurate.. It was she was not felt to be a candidate for an open operation but might be a percutaneous revascularization. She has 1 on the right  lateral lower leg and area on the left lateral. These are punched out ischemic looking. She had a new painful swelling on the left anterior. I unroofed some eschar on this to expose copious amounts of pus which I have obtained for culture. She has been on Keflex I think that it Dr. [name] actually prescribed and I have continued this today pending culture of the abscess. She is allergic to Bactrim and doxycycline 10/5; the patient is not doing well. The small abscess that I noted last week on the left anterior lower leg just above her original wound grew Enterobacter cloacae I. This was resistant to cefazolin I therefore have her on cefdinir. Is today with a larger necrotic wound on her left anterior tibia where I cultured last time the other wounds on the left lower anterior tibia and the right lateral calf are about the same. Nonviable surface I think the patient is really in a lot of pain. I think this is claudication at rest for the most part. She is still not really willing to take the risk of an angiogram until her kidney function improves although I am not really sure where we are with that she follows with nephrology as well. She does have a shunt in her left arm The last lab work I see was in late August. At which time her creatinine was 2.90 estimated GFR of 17. However her glucose at that time was 700. She says she is hoping that her creatinine comes down/improves. I am not sure where we are with that. I cannot see her nephrologist notes 10/18; 2-week follow-up. The patient is not doing well. 2 necrotic wounds on the left anterior tibia. And a small punched-out area on the left posterior calf. We are using Iodoflex to the wound areas. She is having a lot of pain I think this is claudication. She sees her nephrologist tomorrow she was reluctant to even consider angiography until she talk to Dr. Joelyn Oms who is her nephrologist. I think she has a follow-up appointment with Dr. [name] later this  month on 10/29 11th the patient continues not to do well. A lot of pain. 2 large punched out necrotic areas on  the left anterior leg 2 small areas on the left posterior and right posterior calf. She does not keep compression wraps on we are using kerlix and light Coban she takes these off within 12 hours therefore there is little point in doing this. I think she also has chronic venous insufficiency. She follows with Dr. [name] this afternoon unfortunately I think this patient is going to come down to a risky attempt to revascularize her vis--vis her stage IV chronic renal failure versus perhaps an amputation. Fairly clear that the ischemic pain is wearing her down 11/11; the patient saw Dr. [name] on 11/4. The patient underwent a discussion with Dr. [name] that was for cast including primary amputation versus arteriogram with the possibility of acute on severe chronic renal failure necessitating dialysis. She opted for the arteriogram which I think is much as possible is going to be done with CO2 however she may need to get some contrast. This is booked for tomorrow. We have been using Santyl to the wounds which are painful. She tells me she is very much resistant to the idea of amputation. Objective Constitutional Sitting or standing Blood Pressure is within target range for patient.. Pulse regular and within target range for patient.Marland Kitchen Respirations regular, non-labored and within target range.. Temperature is normal and within the target range for the patient.Marland Kitchen Appears in no distress. Vitals Time Taken: 9:38 AM, Height: 60 in, Weight: 160 lbs, BMI: 31.2, Temperature: 98.3 F, Pulse: 81 bpm, Respiratory Rate: 20 breaths/min, Blood Pressure: 139/79 mmHg. Cardiovascular Pedal pulses absent bilaterally.. There is some edema in her legs. General Notes: Wound exam; the left anterior tibia 2 punched-out wounds with necrotic surfaces they actually look somewhat better. No attempted debridement. She  also has a smaller area on the left posterior medial calf and the right calf. These are small but necrotic covered as well. All of this is very painful. Integumentary (Hair, Skin) Wound #1 status is Open. Original cause of wound was Gradually Appeared. The wound is located on the Right,Posterior Lower Leg. The wound measures 0.7cm length x 0.8cm width x 0.3cm depth; 0.44cm^2 area and 0.132cm^3 volume. There is Fat Layer (Subcutaneous Tissue) exposed. There is no tunneling or undermining noted. There is a small amount of serous drainage noted. The wound margin is distinct with the outline attached to the wound base. There is small (1-33%) pink granulation within the wound bed. There is a large (67-100%) amount of necrotic tissue within the wound bed including Adherent Slough. Wound #2 status is Open. Original cause of wound was Gradually Appeared. The wound is located on the Left,Anterior Lower Leg. The wound measures 1.7cm length x 2.1cm width x 0.3cm depth; 2.804cm^2 area and 0.841cm^3 volume. There is Fat Layer (Subcutaneous Tissue) exposed. There is no tunneling or undermining noted. There is a medium amount of serous drainage noted. The wound margin is distinct with the outline attached to the wound base. There is small (1-33%) pink granulation within the wound bed. There is a large (67-100%) amount of necrotic tissue within the wound bed including Adherent Slough. Wound #3 status is Open. Original cause of wound was Gradually Appeared. The wound is located on the Left,Proximal,Anterior Lower Leg. The wound measures 4.1cm length x 2.5cm width x 0.4cm depth; 8.05cm^2 area and 3.22cm^3 volume. There is Fat Layer (Subcutaneous Tissue) exposed. There is no tunneling or undermining noted. There is a medium amount of serous drainage noted. The wound margin is distinct with the outline attached to the wound base. There  is small (1-33%) pink granulation within the wound bed. There is a large (67-100%)  amount of necrotic tissue within the wound bed including Adherent Slough. Wound #4 status is Open. Original cause of wound was Gradually Appeared. The wound is located on the Left,Posterior Lower Leg. The wound measures 0.4cm length x 0.4cm width x 0.2cm depth; 0.126cm^2 area and 0.025cm^3 volume. There is Fat Layer (Subcutaneous Tissue) exposed. There is no tunneling or undermining noted. There is a small amount of serous drainage noted. The wound margin is distinct with the outline attached to the wound base. There is small (1-33%) pink granulation within the wound bed. There is a large (67-100%) amount of necrotic tissue within the wound bed including Adherent Slough. 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 There was a Chemical/Enzymatic/Mechanical debridement performed by Baruch Gouty, RN.Marland Kitchen Agent used was [employer]. A time out was conducted at 10:10, prior to the start of the procedure. There was no bleeding. The procedure was tolerated well. Post Debridement Measurements: 0.7cm length x 0.8cm width x 0.3cm depth; 0.132cm^3 volume. Character of Wound/Ulcer Post Debridement requires further debridement. Severity of Tissue Post Debridement is: Fat layer exposed. Post procedure Diagnosis Wound #1: Same as Pre-Procedure Wound #2 Pre-procedure diagnosis of  Wound #2 is a Diabetic Wound/Ulcer of the Lower Extremity located on the Left,Anterior Lower Leg .Severity of Tissue Pre Debridement is: Fat layer exposed. There was a Chemical/Enzymatic/Mechanical debridement performed by Baruch Gouty, RN.Marland Kitchen Agent used was [employer]. A time out was conducted at 10:10, prior to the start of the procedure. There was no bleeding. The procedure was tolerated well. Post Debridement Measurements: 1.7cm length x 2.1cm width x 0.3cm depth; 0.841cm^3 volume. Character of Wound/Ulcer Post Debridement requires further debridement. Severity of Tissue Post Debridement is: Fat layer exposed. Post procedure Diagnosis Wound #2: Same as Pre-Procedure Wound #3 Pre-procedure diagnosis of Wound #3 is a Diabetic Wound/Ulcer of the Lower Extremity located on the Left,Proximal,Anterior Lower Leg .Severity of Tissue Pre Debridement is: Fat layer exposed. There was a Chemical/Enzymatic/Mechanical debridement performed by Baruch Gouty, RN.Marland Kitchen Agent used was [employer]. A time out was conducted at 10:10, prior to the start of the procedure. There was no bleeding. The procedure was tolerated well. Post Debridement Measurements: 4.1cm length x 2.5cm width x 0.4cm depth; 3.22cm^3 volume. Character of Wound/Ulcer Post Debridement requires further debridement. Severity of Tissue Post Debridement is: Fat layer exposed. Post procedure Diagnosis Wound #3: Same as Pre-Procedure Wound #4 Pre-procedure diagnosis of Wound #4 is a Diabetic Wound/Ulcer of the Lower Extremity located on the Left,Posterior Lower Leg .Severity of Tissue Pre Debridement is: Fat layer exposed. There was a Chemical/Enzymatic/Mechanical debridement performed by Baruch Gouty, RN.Marland Kitchen Agent used was [employer]. A time out was conducted at 10:10, prior to the start of the procedure. There was no bleeding. The procedure was tolerated well. Post Debridement Measurements: 0.4cm length x 0.4cm width x 0.2cm depth; 0.025cm^3 volume. Character  of Wound/Ulcer Post Debridement requires further  debridement. Severity of Tissue Post Debridement is: Fat layer exposed. Post procedure Diagnosis Wound #4: Same as Pre-Procedure Plan Follow-up Appointments: Return appointment in 3 weeks. - Thursday 06/19/2020 Dressing Change Frequency: Change dressing every day. - twice a week by home health all other days my patient or family. Wound Cleansing: Clean wound with Normal Saline. - or wound cleanser Primary Wound Dressing: Wound #1 Right,Posterior Lower Leg: Santyl Ointment - apply directly to wound bed. Wound #2 Left,Anterior Lower Leg: Santyl Ointment - apply directly to wound bed. Wound #3 Left,Proximal,Anterior Lower Leg: Santyl Ointment - apply directly to wound bed. Wound #4 Left,Posterior Lower Leg: Santyl Ointment - apply directly to wound bed. Secondary Dressing: Kerlix/Rolled Gauze ABD pad Edema Control: Avoid standing for long periods of time Elevate legs to the level of the heart or above for 30 minutes daily and/or when sitting, a frequency of: - throughout the day Home Health: [facility] - [facility] The following medication(s) was prescribed: Santyl topical 250 unit/gram ointment ointment topical to wound change daily (continuing rx) starting 05/29/2020 1. I think she should continue Santyl now on after her intervention. 2. Obviously if she ends up with a amputation then that would be a mute point 3. Hopefully they can identify something that will help this patient with her blood flow issues. Although she says she is very opposed to an amputation the amount of pain that ischemia can cause eventually changes most people's minds. #4 the other issue would be acute on chronic renal failure caused by any amount of the standard contrast. She is aware of this. 5. I will see her back the week after Thanksgiving. I recommend Santyl to all of these wounds while we await results of the angiogram  hopefully successfully. Kerlix/ABDs without compression 6. I put in a refill for her Environmental health practitioner) Signed: 05/29/2020 5:00:15 PM By: Linton Ham MD Entered By: Linton Ham on 05/29/2020 10:26:02 -------------------------------------------------------------------------------- SuperBill Details Patient Name: Date of Service: [name] Mode, DO RIS 05/29/2020 Medical Record Number: [id number] Patient Account Number: [id number] Date of Birth/Sex: Treating RN: 1945-10-08 (74 y.o. [name], [name] Primary Care Provider: [name] Other Clinician: Referring Provider: Treating Provider/Extender: [name] in Treatment: 12 Diagnosis Coding ICD-10 Codes Code Description [phone] Non-pressure chronic ulcer of right calf limited to breakdown of skin L97.828 Non-pressure chronic ulcer of other part of left lower leg with other specified severity N18.4 Chronic kidney disease, stage 4 (severe) E11.22 Type 2 diabetes mellitus with diabetic chronic kidney disease L02.416 Cutaneous abscess of left lower limb L97.218 Non-pressure chronic ulcer of right calf with other specified severity Facility Procedures CPT4 Code: [id number] Description: 15176 - DEBRIDE W/O ANES NON SELECT Modifier: Quantity: 1 Physician Procedures Electronic Signature(s) Signed: 05/29/2020 5:00:15 PM By: Linton Ham MD Entered By: Linton Ham on 05/29/2020 10:26:29

## 2020-05-30 ENCOUNTER — Encounter (HOSPITAL_COMMUNITY)
Admission: RE | Disposition: A | Discharge status: Home / Self Care | Payer: Self-pay | Source: Home / Self Care | Attending: Vascular Surgery

## 2020-05-30 ENCOUNTER — Ambulatory Visit (HOSPITAL_COMMUNITY)
Admission: RE | Admit: 2020-05-30 | Discharge: 2020-05-30 | Disposition: A | Discharge status: Home / Self Care | Payer: Medicare Other | Attending: Vascular Surgery | Admitting: Vascular Surgery

## 2020-05-30 ENCOUNTER — Other Ambulatory Visit: Payer: Self-pay

## 2020-05-30 DIAGNOSIS — Z9981 Dependence on supplemental oxygen: Secondary | ICD-10-CM | POA: Diagnosis not present

## 2020-05-30 DIAGNOSIS — L97829 Non-pressure chronic ulcer of other part of left lower leg with unspecified severity: Secondary | ICD-10-CM | POA: Insufficient documentation

## 2020-05-30 DIAGNOSIS — Z79899 Other long term (current) drug therapy: Secondary | ICD-10-CM | POA: Insufficient documentation

## 2020-05-30 DIAGNOSIS — E1122 Type 2 diabetes mellitus with diabetic chronic kidney disease: Secondary | ICD-10-CM | POA: Insufficient documentation

## 2020-05-30 DIAGNOSIS — Z940 Kidney transplant status: Secondary | ICD-10-CM | POA: Diagnosis not present

## 2020-05-30 DIAGNOSIS — I70248 Atherosclerosis of native arteries of left leg with ulceration of other part of lower left leg: Secondary | ICD-10-CM | POA: Diagnosis not present

## 2020-05-30 DIAGNOSIS — Z7901 Long term (current) use of anticoagulants: Secondary | ICD-10-CM | POA: Insufficient documentation

## 2020-05-30 DIAGNOSIS — N186 End stage renal disease: Secondary | ICD-10-CM | POA: Insufficient documentation

## 2020-05-30 DIAGNOSIS — Z794 Long term (current) use of insulin: Secondary | ICD-10-CM | POA: Insufficient documentation

## 2020-05-30 DIAGNOSIS — I739 Peripheral vascular disease, unspecified: Secondary | ICD-10-CM

## 2020-05-30 DIAGNOSIS — E1151 Type 2 diabetes mellitus with diabetic peripheral angiopathy without gangrene: Secondary | ICD-10-CM | POA: Diagnosis not present

## 2020-05-30 DIAGNOSIS — I482 Chronic atrial fibrillation, unspecified: Secondary | ICD-10-CM | POA: Diagnosis not present

## 2020-05-30 HISTORY — PX: ABDOMINAL AORTOGRAM W/LOWER EXTREMITY: CATH118223

## 2020-05-30 LAB — POCT I-STAT, CHEM 8
BUN: 75 mg/dL — ABNORMAL HIGH (ref 8–23)
Calcium, Ion: 1.36 mmol/L (ref 1.15–1.40)
Chloride: 95 mmol/L — ABNORMAL LOW (ref 98–111)
Creatinine, Ser: 3.2 mg/dL — ABNORMAL HIGH (ref 0.44–1.00)
Glucose, Bld: 400 mg/dL — ABNORMAL HIGH (ref 70–99)
HCT: 45 % (ref 36.0–46.0)
Hemoglobin: 15.3 g/dL — ABNORMAL HIGH (ref 12.0–15.0)
Potassium: 4.1 mmol/L (ref 3.5–5.1)
Sodium: 138 mmol/L (ref 135–145)
TCO2: 32 mmol/L (ref 22–32)

## 2020-05-30 LAB — GLUCOSE, CAPILLARY
Glucose-Capillary: 263 mg/dL — ABNORMAL HIGH (ref 70–99)
Glucose-Capillary: 87 mg/dL (ref 70–99)
Glucose-Capillary: 63 mg/dL — ABNORMAL LOW (ref 70–99)
Glucose-Capillary: 87 mg/dL (ref 70–99)
Glucose-Capillary: 92 mg/dL (ref 70–99)

## 2020-05-30 LAB — PROTIME-INR
INR: 1.4 — ABNORMAL HIGH (ref 0.8–1.2)
Prothrombin Time: 16.6 seconds — ABNORMAL HIGH (ref 11.4–15.2)

## 2020-05-30 SURGERY — ABDOMINAL AORTOGRAM W/LOWER EXTREMITY
Anesthesia: LOCAL | Laterality: Bilateral

## 2020-05-30 MED ORDER — INSULIN ASPART 100 UNIT/ML ~~LOC~~ SOLN
0.0000 [IU] | Freq: Three times a day (TID) | SUBCUTANEOUS | Status: DC
  Administered 2020-05-30: 20 [IU] via SUBCUTANEOUS
  Filled 2020-05-30: qty 1

## 2020-05-30 MED ORDER — LIDOCAINE HCL (PF) 1 % IJ SOLN
INTRAMUSCULAR | Status: AC
  Filled 2020-05-30: qty 30

## 2020-05-30 MED ORDER — ONDANSETRON HCL 4 MG/2ML IJ SOLN: 4.0000 mg | Freq: Four times a day (QID) | INTRAMUSCULAR | Status: DC | PRN

## 2020-05-30 MED ORDER — SODIUM CHLORIDE 0.9 % IV SOLN: 250.0000 mL | INTRAVENOUS | Status: DC | PRN

## 2020-05-30 MED ORDER — SODIUM CHLORIDE 0.9% FLUSH: 3.0000 mL | INTRAVENOUS | Status: DC | PRN

## 2020-05-30 MED ORDER — HEPARIN (PORCINE) IN NACL 1000-0.9 UT/500ML-% IV SOLN
INTRAVENOUS | Status: DC | PRN
  Administered 2020-05-30 (×2): 500 mL

## 2020-05-30 MED ORDER — LABETALOL HCL 5 MG/ML IV SOLN: 10.0000 mg | INTRAVENOUS | Status: DC | PRN

## 2020-05-30 MED ORDER — DEXTROSE 50 % IV SOLN
25.0000 mL | Freq: Once | INTRAVENOUS | Status: AC
  Administered 2020-05-30: 25 mL via INTRAVENOUS

## 2020-05-30 MED ORDER — HYDRALAZINE HCL 20 MG/ML IJ SOLN: 5.0000 mg | INTRAMUSCULAR | Status: DC | PRN

## 2020-05-30 MED ORDER — MORPHINE SULFATE (PF) 2 MG/ML IV SOLN
INTRAVENOUS | Status: AC
  Filled 2020-05-30: qty 1

## 2020-05-30 MED ORDER — DEXTROSE 50 % IV SOLN
INTRAVENOUS | Status: AC
  Filled 2020-05-30: qty 50

## 2020-05-30 MED ORDER — HEPARIN (PORCINE) IN NACL 1000-0.9 UT/500ML-% IV SOLN
INTRAVENOUS | Status: AC
  Filled 2020-05-30: qty 1000

## 2020-05-30 MED ORDER — ACETAMINOPHEN 325 MG PO TABS: 650.0000 mg | ORAL_TABLET | ORAL | Status: DC | PRN

## 2020-05-30 MED ORDER — SODIUM CHLORIDE 0.9% FLUSH: 3.0000 mL | Freq: Two times a day (BID) | INTRAVENOUS | Status: DC

## 2020-05-30 MED ORDER — OXYCODONE HCL 5 MG PO TABS
5.0000 mg | ORAL_TABLET | ORAL | Status: DC | PRN
  Administered 2020-05-30: 10 mg via ORAL
  Filled 2020-05-30: qty 2

## 2020-05-30 MED ORDER — LIDOCAINE HCL (PF) 1 % IJ SOLN
INTRAMUSCULAR | Status: DC | PRN
  Administered 2020-05-30: 14 mL

## 2020-05-30 MED ORDER — MORPHINE SULFATE (PF) 2 MG/ML IV SOLN
2.0000 mg | INTRAVENOUS | Status: DC | PRN
  Administered 2020-05-30: 2 mg via INTRAVENOUS

## 2020-05-30 MED ORDER — SODIUM CHLORIDE 0.9 % IV SOLN: INTRAVENOUS | Status: DC

## 2020-05-30 SURGICAL SUPPLY — 16 items
CATH ANGIO 5F PIGTAIL 65CM (CATHETERS) ×1 IMPLANT
CATH CROSS OVER TEMPO 5F (CATHETERS) ×1 IMPLANT
CATH QUICKCROSS SUPP .035X90CM (MICROCATHETER) ×1 IMPLANT
CATH STRAIGHT 5FR 65CM (CATHETERS) ×1 IMPLANT
GUIDEWIRE ANGLED .035X150CM (WIRE) ×1 IMPLANT
KIT MICROPUNCTURE NIT STIFF (SHEATH) ×1 IMPLANT
KIT PV (KITS) ×2 IMPLANT
RESERVOIR CO2 (VASCULAR PRODUCTS) ×1 IMPLANT
SET FLUSH CO2 (MISCELLANEOUS) ×1 IMPLANT
SHEATH PINNACLE 5F 10CM (SHEATH) ×1 IMPLANT
SHEATH PROBE COVER 6X72 (BAG) ×1 IMPLANT
SYR MEDRAD MARK 7 150ML (SYRINGE) ×2 IMPLANT
TRANSDUCER W/STOPCOCK (MISCELLANEOUS) ×2 IMPLANT
TRAY PV CATH (CUSTOM PROCEDURE TRAY) ×2 IMPLANT
WIRE BENTSON .035X145CM (WIRE) ×1 IMPLANT
WIRE HITORQ VERSACORE ST 145CM (WIRE) ×1 IMPLANT

## 2020-05-30 NOTE — Progress Notes (Signed)
Awake and alert and c/o left leg pain 8/10 and pain med given

## 2020-05-30 NOTE — Discharge Instructions (Signed)
RESUME USUAL WARFARIN DOSE TOMORROW   Femoral Site Care This sheet gives you information about how to care for yourself after your procedure. Your health care provider may also give you more specific instructions. If you have problems or questions, contact your health care provider. What can I expect after the procedure? After the procedure, it is common to have:  Bruising that usually fades within 1-2 weeks.  Tenderness at the site. Follow these instructions at home: Wound care  Follow instructions from your health care provider about how to take care of your insertion site. Make sure you: ? Wash your hands with soap and water before you change your bandage (dressing). If soap and water are not available, use hand sanitizer. ? Change your dressing as told by your health care provider. ? Leave stitches (sutures), skin glue, or adhesive strips in place. These skin closures may need to stay in place for 2 weeks or longer. If adhesive strip edges start to loosen and curl up, you may trim the loose edges. Do not remove adhesive strips completely unless your health care provider tells you to do that.  Do not take baths, swim, or use a hot tub until your health care provider approves.  You may shower 24-48 hours after the procedure or as told by your health care provider. ? Gently wash the site with plain soap and water. ? Pat the area dry with a clean towel. ? Do not rub the site. This may cause bleeding.  Do not apply powder or lotion to the site. Keep the site clean and dry.  Check your femoral site every day for signs of infection. Check for: ? Redness, swelling, or pain. ? Fluid or blood. ? Warmth. ? Pus or a bad smell. Activity  For the first 2-3 days after your procedure, or as long as directed: ? Avoid climbing stairs as much as possible. ? Do not squat.  Do not lift anything that is heavier than 10 lb (4.5 kg), or the limit that you are told, until your health care provider  says that it is safe.  Rest as directed. ? Avoid sitting for a long time without moving. Get up to take short walks every 1-2 hours.  Do not drive for 24 hours if you were given a medicine to help you relax (sedative). General instructions  Take over-the-counter and prescription medicines only as told by your health care provider.  Keep all follow-up visits as told by your health care provider. This is important. Contact a health care provider if you have:  A fever or chills.  You have redness, swelling, or pain around your insertion site. Get help right away if:  The catheter insertion area swells very fast.  You pass out.  You suddenly start to sweat or your skin gets clammy.  The catheter insertion area is bleeding, and the bleeding does not stop when you hold steady pressure on the area.  The area near or just beyond the catheter insertion site becomes pale, cool, tingly, or numb. These symptoms may represent a serious problem that is an emergency. Do not wait to see if the symptoms will go away. Get medical help right away. Call your local emergency services (911 in the [place] Do not drive yourself to the hospital. Summary  After the procedure, it is common to have bruising that usually fades within 1-2 weeks.  Check your femoral site every day for signs of infection.  Do not lift anything that is heavier  than 10 lb (4.5 kg), or the limit that you are told, until your health care provider says that it is safe. This information is not intended to replace advice given to you by your health care provider. Make sure you discuss any questions you have with your health care provider. Document Revised: 07/18/2017 Document Reviewed: 07/18/2017 Elsevier Patient Education  2020 [employer].

## 2020-05-30 NOTE — Progress Notes (Signed)
Client awake and alert now and states feels like going home; right groin stable, no bleeding or hematoma; at first client spoke to husband and stated didn't feel like going home and Dr [name] notified and no new orders noted and per Dr [name] client to go home

## 2020-05-30 NOTE — Progress Notes (Signed)
Inpatient Diabetes Program Recommendations  AACE/ADA: New Consensus Statement on Inpatient Glycemic Control (2015)  Target Ranges:  Prepandial:   less than 140 mg/dL      Peak postprandial:   less than 180 mg/dL (1-2 hours)      Critically ill patients:  140 - 180 mg/dL   Lab Results  Component Value Date   GLUCAP 263 (H) 05/30/2020   HGBA1C 12.3 (H) 01/31/2020    Review of Glycemic Control Results for LOGANN, WHITEBREAD (MRN [id number]) as of 05/30/2020 12:22  Ref. Range 05/30/2020 09:20  Glucose-Capillary Latest Ref Range: 70 - 99 mg/dL 263 (H)   Diabetes history: Type 2 DM Outpatient Diabetes medications: Humalog 10 units TID, Toujeo 60 units QAM Current orders for Inpatient glycemic control: Novolog 0-20 units TID  Inpatient Diabetes Program Recommendations:    Consider adding Lantus 20 units QD.   Thanks, Bronson Curb, MSN, RNC-OB Diabetes Coordinator [phone] (8a-5p)

## 2020-05-30 NOTE — Op Note (Signed)
Procedure: Abdominal aortogram with bilateral lower extremity runoff using carbon dioxide contrast  Preoperative diagnosis: Nonhealing wounds bilateral lower extremities  Postoperative diagnosis: Same  Anesthesia: Local  Operative findings: 1.  Bilateral flush superficial femoral artery occlusions  2.  One-vessel very diseased calcified peroneal runoff bilaterally  Operative details: After obtaining informed consent the patient was taken to the [facility] lab.  The patient was placed in supine position angio table.  Both groins were prepped and draped in usual sterile fashion.  Ultrasound was used to identify the right common femoral artery and femoral bifurcation.  There was heavy calcification of the vessel under ultrasound.  After several attempts using an introducer needle to get in the right common femoral artery I was able to finally successfully cannulate this with a micropuncture needle and the micropuncture wire advanced into the right iliac system under fluoroscopy.  Micropuncture sheath was then advanced over the guidewire.  Guidewire was removed and the wire swapped out for an 035 versa core wire which was advanced in the abdominal aorta.  Micropuncture sheath was then removed over the wire and swapped out for a 5 [place] straight short sheath.  This was thoroughly flushed with heparinized saline.  A 5 French pigtail catheter was advanced over the guidewire into the abdominal aorta and an abdominal aortogram obtained using carbon dioxide contrast.  The infrarenal abdominal aorta is heavily calcified but patent.  The left and right common iliac arteries are heavily calcified but patent.  Next the pigtail catheter was pulled down just above the aortic bifurcation and bilateral oblique views of the pelvis were performed.  This again shows heavy calcification but the internal and external iliac arteries are patent.  A left renal transplant renal artery is visualized coming off the external iliac artery  and although CO2 contrast use this is patent it is difficult to determine whether or not there is any narrowing of this.  Next the pigtail catheter was removed over a guidewire and a 5 [place] crossover catheter used to selectively catheterize the left common iliac artery and then advance an 035 angled Glidewire down into the left common femoral artery.  An 035 quick cross was then advanced over the Glidewire into the left common femoral artery and the Glidewire swapped out for an 035 versa core wire.  I was then able to advance a 5 [place] straight catheter up and over the aortic bifurcation and down into the distal left external iliac artery.  Angiogram using carbon dioxide contrast was then performed the left lower extremity.  The left common femoral artery is heavily calcified.  The profunda is calcified but patent.  There is a flush occlusion of the left superficial femoral artery.  There is then reconstitution of the below-knee popliteal artery with one-vessel runoff via the peroneal artery.  At this point the 5 French straight catheter was removed over guidewire and right lower extremity views were obtained through the sheath.  The right common femoral artery is heavily diseased with a 50% narrowing.  The right superficial femoral artery has a flush occlusion at the origin.  The right profunda is patent.  There are minimal collaterals in the right thigh.  The right below-knee popliteal artery does reconstitute via collaterals and there is one-vessel peroneal runoff to the right foot.  At this point the procedure was concluded.  The patient tolerated procedure well and there were no complications.  The patient was taken to the holding area in stable condition.  5 French sheath will  be removed in the holding area.  Operative management: Patient overall is a very poor operative candidate.  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  She is not a candidate for percutaneous intervention because she has a flush SFA occlusion on both sides.  Her only options at this point would be continued local wound care to see if the wounds will heal.  These have not healed in several months so if eventually she decides the pain from the wounds or the wound burden is too much for her she would be a candidate for bilateral or unilateral above-knee amputation.  I discussed all of this with the patient and her husband today.  She will follow up with me on an as-needed basis.  Ruta Hinds, MD [facility] Office: [phone]

## 2020-05-30 NOTE — Progress Notes (Signed)
Dr [name] notified of client difficult to arouse, confused as to place and CBG; no new orders

## 2020-05-30 NOTE — Progress Notes (Signed)
Client difficult to wake up; CBG 63 and 50% dextrose given; client confused to place

## 2020-05-30 NOTE — Interval H&P Note (Signed)
History and Physical Interval Note:  05/30/2020 9:30 AM  [name]  has presented today for surgery, with the diagnosis of PAD.  The various methods of treatment have been discussed with the patient and family. After consideration of risks, benefits and other options for treatment, the patient has consented to  Procedure(s): ABDOMINAL AORTOGRAM W/LOWER EXTREMITY (Bilateral) as a surgical intervention.  The patient's history has been reviewed, patient examined, no change in status, stable for surgery.  I have reviewed the patient's chart and labs.  Questions were answered to the patient's satisfaction.     Ruta Hinds

## 2020-05-30 NOTE — Progress Notes (Signed)
Site area: rt fem art 67fr Site Prior to Removal:  Level 0 Pressure Applied For:22min Manual:   yes Patient Status During Pull:  A/O Post Pull Site:  Level 0 Post Pull Instructions Given: yes, pt has been informed and understands   Post Pull Pulses Present: Not able to access Dressing Applied:  tegaderm and a 4x4 Bedrest begins @13 :10:00 Comments:Pt leaves cath lab procedure room in stable condition. Rt groin unremarkable. Dressing is CDI.

## 2020-06-02 ENCOUNTER — Encounter (HOSPITAL_COMMUNITY): Payer: Self-pay | Admitting: Vascular Surgery

## 2020-06-03 ENCOUNTER — Inpatient Hospital Stay (HOSPITAL_COMMUNITY): Payer: Medicare Other

## 2020-06-03 ENCOUNTER — Other Ambulatory Visit: Payer: Self-pay | Admitting: Internal Medicine

## 2020-06-03 ENCOUNTER — Emergency Department (HOSPITAL_COMMUNITY): Payer: Medicare Other

## 2020-06-03 ENCOUNTER — Inpatient Hospital Stay (HOSPITAL_COMMUNITY)
Admission: EM | Admit: 2020-06-03 | Discharge: 2020-06-17 | DRG: 871 | Disposition: A | Discharge status: Home Health | Payer: Medicare Other | Attending: Internal Medicine | Admitting: Internal Medicine

## 2020-06-03 ENCOUNTER — Encounter (HOSPITAL_COMMUNITY): Payer: Self-pay | Admitting: Internal Medicine

## 2020-06-03 DIAGNOSIS — R4182 Altered mental status, unspecified: Secondary | ICD-10-CM | POA: Diagnosis not present

## 2020-06-03 DIAGNOSIS — R0902 Hypoxemia: Secondary | ICD-10-CM | POA: Diagnosis not present

## 2020-06-03 DIAGNOSIS — I83029 Varicose veins of left lower extremity with ulcer of unspecified site: Secondary | ICD-10-CM | POA: Diagnosis present

## 2020-06-03 DIAGNOSIS — I5033 Acute on chronic diastolic (congestive) heart failure: Secondary | ICD-10-CM | POA: Diagnosis present

## 2020-06-03 DIAGNOSIS — N200 Calculus of kidney: Secondary | ICD-10-CM | POA: Diagnosis not present

## 2020-06-03 DIAGNOSIS — E1121 Type 2 diabetes mellitus with diabetic nephropathy: Secondary | ICD-10-CM

## 2020-06-03 DIAGNOSIS — Z953 Presence of xenogenic heart valve: Secondary | ICD-10-CM | POA: Diagnosis not present

## 2020-06-03 DIAGNOSIS — H9190 Unspecified hearing loss, unspecified ear: Secondary | ICD-10-CM | POA: Diagnosis present

## 2020-06-03 DIAGNOSIS — E785 Hyperlipidemia, unspecified: Secondary | ICD-10-CM | POA: Diagnosis present

## 2020-06-03 DIAGNOSIS — C3491 Malignant neoplasm of unspecified part of right bronchus or lung: Secondary | ICD-10-CM | POA: Diagnosis present

## 2020-06-03 DIAGNOSIS — Z794 Long term (current) use of insulin: Secondary | ICD-10-CM

## 2020-06-03 DIAGNOSIS — L97919 Non-pressure chronic ulcer of unspecified part of right lower leg with unspecified severity: Secondary | ICD-10-CM

## 2020-06-03 DIAGNOSIS — G4733 Obstructive sleep apnea (adult) (pediatric): Secondary | ICD-10-CM | POA: Diagnosis present

## 2020-06-03 DIAGNOSIS — R509 Fever, unspecified: Secondary | ICD-10-CM

## 2020-06-03 DIAGNOSIS — A419 Sepsis, unspecified organism: Principal | ICD-10-CM | POA: Diagnosis present

## 2020-06-03 DIAGNOSIS — I517 Cardiomegaly: Secondary | ICD-10-CM | POA: Diagnosis not present

## 2020-06-03 DIAGNOSIS — R410 Disorientation, unspecified: Secondary | ICD-10-CM | POA: Diagnosis not present

## 2020-06-03 DIAGNOSIS — L03115 Cellulitis of right lower limb: Secondary | ICD-10-CM

## 2020-06-03 DIAGNOSIS — M79605 Pain in left leg: Secondary | ICD-10-CM | POA: Diagnosis not present

## 2020-06-03 DIAGNOSIS — Z8673 Personal history of transient ischemic attack (TIA), and cerebral infarction without residual deficits: Secondary | ICD-10-CM | POA: Diagnosis not present

## 2020-06-03 DIAGNOSIS — Z7189 Other specified counseling: Secondary | ICD-10-CM

## 2020-06-03 DIAGNOSIS — L03116 Cellulitis of left lower limb: Secondary | ICD-10-CM | POA: Diagnosis present

## 2020-06-03 DIAGNOSIS — Z85118 Personal history of other malignant neoplasm of bronchus and lung: Secondary | ICD-10-CM | POA: Diagnosis not present

## 2020-06-03 DIAGNOSIS — E11649 Type 2 diabetes mellitus with hypoglycemia without coma: Secondary | ICD-10-CM | POA: Diagnosis not present

## 2020-06-03 DIAGNOSIS — Z7401 Bed confinement status: Secondary | ICD-10-CM | POA: Diagnosis not present

## 2020-06-03 DIAGNOSIS — Z823 Family history of stroke: Secondary | ICD-10-CM

## 2020-06-03 DIAGNOSIS — R52 Pain, unspecified: Secondary | ICD-10-CM | POA: Diagnosis not present

## 2020-06-03 DIAGNOSIS — I4891 Unspecified atrial fibrillation: Secondary | ICD-10-CM

## 2020-06-03 DIAGNOSIS — I69320 Aphasia following cerebral infarction: Secondary | ICD-10-CM

## 2020-06-03 DIAGNOSIS — Z940 Kidney transplant status: Secondary | ICD-10-CM

## 2020-06-03 DIAGNOSIS — Z79890 Hormone replacement therapy (postmenopausal): Secondary | ICD-10-CM

## 2020-06-03 DIAGNOSIS — I2510 Atherosclerotic heart disease of native coronary artery without angina pectoris: Secondary | ICD-10-CM | POA: Diagnosis present

## 2020-06-03 DIAGNOSIS — E1165 Type 2 diabetes mellitus with hyperglycemia: Secondary | ICD-10-CM | POA: Diagnosis not present

## 2020-06-03 DIAGNOSIS — Z833 Family history of diabetes mellitus: Secondary | ICD-10-CM

## 2020-06-03 DIAGNOSIS — Z515 Encounter for palliative care: Secondary | ICD-10-CM

## 2020-06-03 DIAGNOSIS — J449 Chronic obstructive pulmonary disease, unspecified: Secondary | ICD-10-CM | POA: Diagnosis present

## 2020-06-03 DIAGNOSIS — R531 Weakness: Secondary | ICD-10-CM

## 2020-06-03 DIAGNOSIS — I272 Pulmonary hypertension, unspecified: Secondary | ICD-10-CM | POA: Diagnosis present

## 2020-06-03 DIAGNOSIS — L97822 Non-pressure chronic ulcer of other part of left lower leg with fat layer exposed: Secondary | ICD-10-CM | POA: Diagnosis present

## 2020-06-03 DIAGNOSIS — J9621 Acute and chronic respiratory failure with hypoxia: Secondary | ICD-10-CM | POA: Diagnosis present

## 2020-06-03 DIAGNOSIS — E875 Hyperkalemia: Secondary | ICD-10-CM | POA: Diagnosis not present

## 2020-06-03 DIAGNOSIS — I83019 Varicose veins of right lower extremity with ulcer of unspecified site: Secondary | ICD-10-CM | POA: Diagnosis not present

## 2020-06-03 DIAGNOSIS — E1122 Type 2 diabetes mellitus with diabetic chronic kidney disease: Secondary | ICD-10-CM | POA: Diagnosis present

## 2020-06-03 DIAGNOSIS — Z66 Do not resuscitate: Secondary | ICD-10-CM | POA: Diagnosis not present

## 2020-06-03 DIAGNOSIS — I739 Peripheral vascular disease, unspecified: Secondary | ICD-10-CM | POA: Diagnosis not present

## 2020-06-03 DIAGNOSIS — Z888 Allergy status to other drugs, medicaments and biological substances status: Secondary | ICD-10-CM

## 2020-06-03 DIAGNOSIS — Z20822 Contact with and (suspected) exposure to covid-19: Secondary | ICD-10-CM | POA: Diagnosis present

## 2020-06-03 DIAGNOSIS — N186 End stage renal disease: Secondary | ICD-10-CM | POA: Diagnosis not present

## 2020-06-03 DIAGNOSIS — R404 Transient alteration of awareness: Secondary | ICD-10-CM | POA: Diagnosis not present

## 2020-06-03 DIAGNOSIS — E119 Type 2 diabetes mellitus without complications: Secondary | ICD-10-CM | POA: Diagnosis present

## 2020-06-03 DIAGNOSIS — I252 Old myocardial infarction: Secondary | ICD-10-CM

## 2020-06-03 DIAGNOSIS — N390 Urinary tract infection, site not specified: Principal | ICD-10-CM

## 2020-06-03 DIAGNOSIS — Z7901 Long term (current) use of anticoagulants: Secondary | ICD-10-CM

## 2020-06-03 DIAGNOSIS — Z955 Presence of coronary angioplasty implant and graft: Secondary | ICD-10-CM

## 2020-06-03 DIAGNOSIS — Z79899 Other long term (current) drug therapy: Secondary | ICD-10-CM

## 2020-06-03 DIAGNOSIS — N184 Chronic kidney disease, stage 4 (severe): Secondary | ICD-10-CM | POA: Diagnosis present

## 2020-06-03 DIAGNOSIS — R0689 Other abnormalities of breathing: Secondary | ICD-10-CM | POA: Diagnosis not present

## 2020-06-03 DIAGNOSIS — Z8249 Family history of ischemic heart disease and other diseases of the circulatory system: Secondary | ICD-10-CM

## 2020-06-03 DIAGNOSIS — I480 Paroxysmal atrial fibrillation: Secondary | ICD-10-CM | POA: Diagnosis present

## 2020-06-03 DIAGNOSIS — Z881 Allergy status to other antibiotic agents status: Secondary | ICD-10-CM

## 2020-06-03 DIAGNOSIS — E876 Hypokalemia: Secondary | ICD-10-CM | POA: Diagnosis not present

## 2020-06-03 DIAGNOSIS — Z9981 Dependence on supplemental oxygen: Secondary | ICD-10-CM

## 2020-06-03 DIAGNOSIS — I130 Hypertensive heart and chronic kidney disease with heart failure and stage 1 through stage 4 chronic kidney disease, or unspecified chronic kidney disease: Secondary | ICD-10-CM | POA: Diagnosis present

## 2020-06-03 DIAGNOSIS — I081 Rheumatic disorders of both mitral and tricuspid valves: Secondary | ICD-10-CM | POA: Diagnosis present

## 2020-06-03 DIAGNOSIS — S81802A Unspecified open wound, left lower leg, initial encounter: Secondary | ICD-10-CM | POA: Diagnosis not present

## 2020-06-03 DIAGNOSIS — Z882 Allergy status to sulfonamides status: Secondary | ICD-10-CM

## 2020-06-03 DIAGNOSIS — E1151 Type 2 diabetes mellitus with diabetic peripheral angiopathy without gangrene: Secondary | ICD-10-CM | POA: Diagnosis present

## 2020-06-03 DIAGNOSIS — Z9102 Food additives allergy status: Secondary | ICD-10-CM

## 2020-06-03 DIAGNOSIS — Z87891 Personal history of nicotine dependence: Secondary | ICD-10-CM

## 2020-06-03 DIAGNOSIS — M109 Gout, unspecified: Secondary | ICD-10-CM | POA: Diagnosis present

## 2020-06-03 DIAGNOSIS — R000 Tachycardia, unspecified: Secondary | ICD-10-CM | POA: Diagnosis not present

## 2020-06-03 DIAGNOSIS — L97922 Non-pressure chronic ulcer of unspecified part of left lower leg with fat layer exposed: Secondary | ICD-10-CM | POA: Diagnosis not present

## 2020-06-03 DIAGNOSIS — L97929 Non-pressure chronic ulcer of unspecified part of left lower leg with unspecified severity: Secondary | ICD-10-CM

## 2020-06-03 DIAGNOSIS — I4819 Other persistent atrial fibrillation: Secondary | ICD-10-CM | POA: Diagnosis present

## 2020-06-03 DIAGNOSIS — M7989 Other specified soft tissue disorders: Secondary | ICD-10-CM | POA: Diagnosis not present

## 2020-06-03 DIAGNOSIS — M2550 Pain in unspecified joint: Secondary | ICD-10-CM | POA: Diagnosis not present

## 2020-06-03 DIAGNOSIS — Z905 Acquired absence of kidney: Secondary | ICD-10-CM

## 2020-06-03 DIAGNOSIS — M609 Myositis, unspecified: Secondary | ICD-10-CM | POA: Diagnosis present

## 2020-06-03 DIAGNOSIS — Z886 Allergy status to analgesic agent status: Secondary | ICD-10-CM

## 2020-06-03 LAB — CBC WITH DIFFERENTIAL/PLATELET
Abs Immature Granulocytes: 0.12 10*3/uL — ABNORMAL HIGH (ref 0.00–0.07)
Basophils Absolute: 0 10*3/uL (ref 0.0–0.1)
Basophils Relative: 0 %
Eosinophils Absolute: 0 10*3/uL (ref 0.0–0.5)
Eosinophils Relative: 0 %
HCT: 41.7 % (ref 36.0–46.0)
Hemoglobin: 12.5 g/dL (ref 12.0–15.0)
Immature Granulocytes: 1 %
Lymphocytes Relative: 3 %
Lymphs Abs: 0.5 10*3/uL — ABNORMAL LOW (ref 0.7–4.0)
MCH: 24 pg — ABNORMAL LOW (ref 26.0–34.0)
MCHC: 30 g/dL (ref 30.0–36.0)
MCV: 80 fL (ref 80.0–100.0)
Monocytes Absolute: 1.3 10*3/uL — ABNORMAL HIGH (ref 0.1–1.0)
Monocytes Relative: 9 %
Neutro Abs: 12.3 10*3/uL — ABNORMAL HIGH (ref 1.7–7.7)
Neutrophils Relative %: 87 %
Platelets: 199 10*3/uL (ref 150–400)
RBC: 5.21 MIL/uL — ABNORMAL HIGH (ref 3.87–5.11)
RDW: 21.4 % — ABNORMAL HIGH (ref 11.5–15.5)
WBC: 14.2 10*3/uL — ABNORMAL HIGH (ref 4.0–10.5)
nRBC: 0 % (ref 0.0–0.2)

## 2020-06-03 LAB — ECHOCARDIOGRAM COMPLETE
AR max vel: 1.25 cm2
AV Area VTI: 1.54 cm2
AV Area mean vel: 1.19 cm2
AV Mean grad: 18.2 mmHg
AV Peak grad: 35.5 mmHg
Ao pk vel: 2.98 m/s
Area-P 1/2: 3.6 cm2
S' Lateral: 4 cm
Weight: 2892.44 oz

## 2020-06-03 LAB — COMPREHENSIVE METABOLIC PANEL
ALT: 15 U/L (ref 0–44)
AST: 23 U/L (ref 15–41)
Albumin: 2.8 g/dL — ABNORMAL LOW (ref 3.5–5.0)
Alkaline Phosphatase: 136 U/L — ABNORMAL HIGH (ref 38–126)
Anion gap: 11 (ref 5–15)
BUN: 61 mg/dL — ABNORMAL HIGH (ref 8–23)
CO2: 30 mmol/L (ref 22–32)
Calcium: 10.2 mg/dL (ref 8.9–10.3)
Chloride: 100 mmol/L (ref 98–111)
Creatinine, Ser: 2.92 mg/dL — ABNORMAL HIGH (ref 0.44–1.00)
GFR, Estimated: 16 mL/min — ABNORMAL LOW (ref >60)
Glucose, Bld: 239 mg/dL — ABNORMAL HIGH (ref 70–99)
Potassium: 4.6 mmol/L (ref 3.5–5.1)
Sodium: 141 mmol/L (ref 135–145)
Total Bilirubin: 1.1 mg/dL (ref 0.3–1.2)
Total Protein: 7.2 g/dL (ref 6.5–8.1)

## 2020-06-03 LAB — URINALYSIS, ROUTINE W REFLEX MICROSCOPIC
Bacteria, UA: NONE SEEN
Bilirubin Urine: NEGATIVE
Glucose, UA: 150 mg/dL — AB
Hgb urine dipstick: NEGATIVE
Ketones, ur: NEGATIVE mg/dL
Nitrite: NEGATIVE
Protein, ur: >=300 mg/dL — AB
Specific Gravity, Urine: 1.012 (ref 1.005–1.030)
pH: 7 (ref 5.0–8.0)

## 2020-06-03 LAB — RESP PANEL BY RT PCR (RSV, FLU A&B, COVID)
Influenza A by PCR: NEGATIVE
Influenza B by PCR: NEGATIVE
Respiratory Syncytial Virus by PCR: NEGATIVE
SARS Coronavirus 2 by RT PCR: NEGATIVE

## 2020-06-03 LAB — TROPONIN I (HIGH SENSITIVITY)
Troponin I (High Sensitivity): 161 ng/L (ref <18)
Troponin I (High Sensitivity): 151 ng/L (ref <18)
Troponin I (High Sensitivity): 174 ng/L (ref <18)

## 2020-06-03 LAB — BLOOD GAS, VENOUS
Acid-Base Excess: 6 mmol/L — ABNORMAL HIGH (ref 0.0–2.0)
Bicarbonate: 31.9 mmol/L — ABNORMAL HIGH (ref 20.0–28.0)
O2 Saturation: 41.5 %
Patient temperature: 98.6
pCO2, Ven: 54 mmHg (ref 44.0–60.0)
pH, Ven: 7.389 (ref 7.250–7.430)
pO2, Ven: 26.8 mmHg — CL (ref 32.0–45.0)

## 2020-06-03 LAB — LACTIC ACID, PLASMA
Lactic Acid, Venous: 1.7 mmol/L (ref 0.5–1.9)
Lactic Acid, Venous: 1.5 mmol/L (ref 0.5–1.9)

## 2020-06-03 LAB — APTT
aPTT: 32 seconds (ref 24–36)
aPTT: 32 seconds (ref 24–36)

## 2020-06-03 LAB — CBG MONITORING, ED: Glucose-Capillary: 201 mg/dL — ABNORMAL HIGH (ref 70–99)

## 2020-06-03 LAB — GLUCOSE, CAPILLARY
Glucose-Capillary: 212 mg/dL — ABNORMAL HIGH (ref 70–99)
Glucose-Capillary: 180 mg/dL — ABNORMAL HIGH (ref 70–99)

## 2020-06-03 LAB — MRSA PCR SCREENING: MRSA by PCR: POSITIVE — AB

## 2020-06-03 LAB — HEMOGLOBIN A1C
Hgb A1c MFr Bld: 9.2 % — ABNORMAL HIGH (ref 4.8–5.6)
Mean Plasma Glucose: 217.34 mg/dL

## 2020-06-03 LAB — PROTIME-INR
INR: 1.3 — ABNORMAL HIGH (ref 0.8–1.2)
Prothrombin Time: 16.1 seconds — ABNORMAL HIGH (ref 11.4–15.2)

## 2020-06-03 LAB — BRAIN NATRIURETIC PEPTIDE: B Natriuretic Peptide: 1883.6 pg/mL — ABNORMAL HIGH (ref 0.0–100.0)

## 2020-06-03 LAB — HEPARIN LEVEL (UNFRACTIONATED): Heparin Unfractionated: 0.23 IU/mL — ABNORMAL LOW (ref 0.30–0.70)

## 2020-06-03 MED ORDER — FUROSEMIDE 10 MG/ML IJ SOLN
80.0000 mg | Freq: Two times a day (BID) | INTRAMUSCULAR | Status: AC
  Administered 2020-06-03 – 2020-06-06 (×7): 80 mg via INTRAVENOUS
  Filled 2020-06-03 (×7): qty 8

## 2020-06-03 MED ORDER — CHLORHEXIDINE GLUCONATE CLOTH 2 % EX PADS: 6.0000 | MEDICATED_PAD | Freq: Every day | CUTANEOUS | Status: DC

## 2020-06-03 MED ORDER — MORPHINE SULFATE (PF) 2 MG/ML IV SOLN: 2.0000 mg | INTRAVENOUS | Status: DC | PRN

## 2020-06-03 MED ORDER — LACTATED RINGERS IV BOLUS (SEPSIS): 1000.0000 mL | Freq: Once | INTRAVENOUS | Status: DC

## 2020-06-03 MED ORDER — INSULIN ASPART 100 UNIT/ML ~~LOC~~ SOLN
0.0000 [IU] | Freq: Three times a day (TID) | SUBCUTANEOUS | Status: DC
  Administered 2020-06-03 (×2): 3 [IU] via SUBCUTANEOUS
  Administered 2020-06-05 – 2020-06-06 (×4): 1 [IU] via SUBCUTANEOUS
  Administered 2020-06-08 (×2): 2 [IU] via SUBCUTANEOUS
  Administered 2020-06-09: 3 [IU] via SUBCUTANEOUS
  Administered 2020-06-09: 5 [IU] via SUBCUTANEOUS
  Filled 2020-06-03: qty 0.09

## 2020-06-03 MED ORDER — LACTATED RINGERS IV BOLUS (SEPSIS): 500.0000 mL | Freq: Once | INTRAVENOUS | Status: DC

## 2020-06-03 MED ORDER — POLYETHYLENE GLYCOL 3350 17 G PO PACK: 17.0000 g | PACK | Freq: Every day | ORAL | Status: DC | PRN

## 2020-06-03 MED ORDER — INSULIN GLARGINE 100 UNIT/ML ~~LOC~~ SOLN
20.0000 [IU] | Freq: Every day | SUBCUTANEOUS | Status: DC
  Administered 2020-06-03 – 2020-06-05 (×3): 20 [IU] via SUBCUTANEOUS
  Filled 2020-06-03 (×3): qty 0.2

## 2020-06-03 MED ORDER — VANCOMYCIN VARIABLE DOSE PER UNSTABLE RENAL FUNCTION (PHARMACIST DOSING): Status: DC

## 2020-06-03 MED ORDER — HYDROMORPHONE HCL 1 MG/ML IJ SOLN
0.5000 mg | INTRAMUSCULAR | Status: DC | PRN
  Administered 2020-06-03 – 2020-06-05 (×10): 0.5 mg via INTRAVENOUS
  Filled 2020-06-03: qty 1
  Filled 2020-06-03: qty 0.5
  Filled 2020-06-03: qty 1
  Filled 2020-06-03: qty 0.5
  Filled 2020-06-03: qty 1
  Filled 2020-06-03 (×2): qty 0.5
  Filled 2020-06-03 (×2): qty 1
  Filled 2020-06-03 (×2): qty 0.5

## 2020-06-03 MED ORDER — CHLORHEXIDINE GLUCONATE CLOTH 2 % EX PADS
6.0000 | MEDICATED_PAD | Freq: Every day | CUTANEOUS | Status: AC
  Administered 2020-06-04 – 2020-06-08 (×4): 6 via TOPICAL

## 2020-06-03 MED ORDER — FUROSEMIDE 10 MG/ML IJ SOLN
80.0000 mg | Freq: Once | INTRAMUSCULAR | Status: AC
  Administered 2020-06-03: 80 mg via INTRAVENOUS
  Filled 2020-06-03: qty 8

## 2020-06-03 MED ORDER — HEPARIN BOLUS VIA INFUSION
4000.0000 [IU] | Freq: Once | INTRAVENOUS | Status: AC
  Administered 2020-06-03: 4000 [IU] via INTRAVENOUS
  Filled 2020-06-03: qty 4000

## 2020-06-03 MED ORDER — PIPERACILLIN-TAZOBACTAM 3.375 G IVPB
3.3750 g | Freq: Once | INTRAVENOUS | Status: AC
  Administered 2020-06-03: 3.375 g via INTRAVENOUS
  Filled 2020-06-03: qty 50

## 2020-06-03 MED ORDER — SODIUM CHLORIDE 0.9 % IV SOLN
2.0000 g | Freq: Once | INTRAVENOUS | Status: AC
  Administered 2020-06-03: 2 g via INTRAVENOUS
  Filled 2020-06-03: qty 2

## 2020-06-03 MED ORDER — LACTATED RINGERS IV SOLN: INTRAVENOUS | Status: DC

## 2020-06-03 MED ORDER — ACETAMINOPHEN 650 MG RE SUPP
650.0000 mg | Freq: Once | RECTAL | Status: AC
  Administered 2020-06-03: 650 mg via RECTAL
  Filled 2020-06-03: qty 1

## 2020-06-03 MED ORDER — HEPARIN BOLUS VIA INFUSION
1500.0000 [IU] | Freq: Once | INTRAVENOUS | Status: AC
  Administered 2020-06-03: 1500 [IU] via INTRAVENOUS
  Filled 2020-06-03: qty 1500

## 2020-06-03 MED ORDER — VANCOMYCIN HCL IN DEXTROSE 1-5 GM/200ML-% IV SOLN
1000.0000 mg | Freq: Once | INTRAVENOUS | Status: AC
  Administered 2020-06-03: 1000 mg via INTRAVENOUS
  Filled 2020-06-03: qty 200

## 2020-06-03 MED ORDER — DILTIAZEM LOAD VIA INFUSION
10.0000 mg | Freq: Once | INTRAVENOUS | Status: AC
  Administered 2020-06-03: 10 mg via INTRAVENOUS
  Filled 2020-06-03: qty 10

## 2020-06-03 MED ORDER — DILTIAZEM HCL-DEXTROSE 125-5 MG/125ML-% IV SOLN (PREMIX)
5.0000 mg/h | INTRAVENOUS | Status: DC
  Administered 2020-06-03: 5 mg/h via INTRAVENOUS
  Filled 2020-06-03: qty 125

## 2020-06-03 MED ORDER — ACETAMINOPHEN 650 MG RE SUPP: 650.0000 mg | Freq: Four times a day (QID) | RECTAL | Status: DC | PRN

## 2020-06-03 MED ORDER — ACETAMINOPHEN 325 MG PO TABS
650.0000 mg | ORAL_TABLET | Freq: Four times a day (QID) | ORAL | Status: DC | PRN
  Administered 2020-06-03 – 2020-06-04 (×2): 650 mg via ORAL
  Filled 2020-06-03 (×2): qty 2

## 2020-06-03 MED ORDER — SODIUM CHLORIDE 0.9% FLUSH
3.0000 mL | Freq: Two times a day (BID) | INTRAVENOUS | Status: DC
  Administered 2020-06-03 – 2020-06-16 (×21): 3 mL via INTRAVENOUS

## 2020-06-03 MED ORDER — MUPIROCIN 2 % EX OINT
1.0000 "application " | TOPICAL_OINTMENT | Freq: Two times a day (BID) | CUTANEOUS | Status: AC
  Administered 2020-06-03 – 2020-06-08 (×9): 1 via NASAL
  Filled 2020-06-03 (×3): qty 22

## 2020-06-03 MED ORDER — METOPROLOL TARTRATE 25 MG PO TABS
25.0000 mg | ORAL_TABLET | Freq: Two times a day (BID) | ORAL | Status: DC
  Administered 2020-06-03 – 2020-06-08 (×12): 25 mg via ORAL
  Filled 2020-06-03 (×14): qty 1

## 2020-06-03 MED ORDER — HEPARIN (PORCINE) 25000 UT/250ML-% IV SOLN
1000.0000 [IU]/h | INTRAVENOUS | Status: DC
  Administered 2020-06-03: 1000 [IU]/h via INTRAVENOUS
  Filled 2020-06-03: qty 250

## 2020-06-03 MED ORDER — HEPARIN (PORCINE) 25000 UT/250ML-% IV SOLN
1500.0000 [IU]/h | INTRAVENOUS | Status: DC
  Administered 2020-06-04 – 2020-06-07 (×4): 1500 [IU]/h via INTRAVENOUS
  Filled 2020-06-03 (×5): qty 250

## 2020-06-03 MED ORDER — SODIUM CHLORIDE 0.9 % IV SOLN
500.0000 mg | Freq: Two times a day (BID) | INTRAVENOUS | Status: DC
  Administered 2020-06-03 – 2020-06-04 (×2): 500 mg via INTRAVENOUS
  Filled 2020-06-03 (×2): qty 0.5
  Filled 2020-06-03: qty 500

## 2020-06-03 NOTE — ED Notes (Addendum)
Pt aox2. Able to follow commands, pt a lot more lucid then previous shift. Pt sitting up in bed, finishing sandwich and juice. Given medications without issue. Echo at bedside.

## 2020-06-03 NOTE — Progress Notes (Signed)
Pharmacy Antibiotic Note  [name] is a 74 y.o. 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  Pharmacy consulted is consulted to dose vancomycin and meropenem for sepsis.  - Tmax 101.8 - scr 2.92 (crcl~15)  - pt received cefepime 2gm x1 at 0503 and zosyn x1 at 0537  Plan: - vancomycin 1000 mg given in the ED at [facility]. Will f/u renal function and redose if/when appropriate. - meropenem 500 mg IV q12h  ______________________________________  Weight: 82 kg (180 lb 12.4 oz)  Temp (24hrs), Avg:101 F (38.3 C), Min:100.1 F (37.8 C), Max:101.8 F (38.8 C)  Recent Labs  Lab 05/30/20 0624 06/03/20 0407 06/03/20 0619  WBC  --  14.2*  --   CREATININE 3.20* 2.92*  --   LATICACIDVEN  --  1.7 1.5    Estimated Creatinine Clearance: 15.6 mL/min (A) (by C-G formula based on SCr of 2.92 mg/dL (H)).    Allergies  Allergen Reactions  . Ibuprofen Nausea And Vomiting  . Sulfamethoxazole-Trimethoprim Itching, Swelling and Rash    Swelling of the face  . Sulfonamide Derivatives Itching, Swelling and Rash    Swelling of the face  . Tape Rash    Paper tape is ok  . Tramadol Nausea And Vomiting  . Doxycycline Nausea Only  . Hydrocil [Psyllium] Nausea And Vomiting  . Bactrim Itching, Swelling and Rash  . Red Dye Itching and Rash   Antimicrobials this admission:  11/16 cefepime x1 11/16 zosyn x1 11/16 vanc>> 11/16 merrem>>  Microbiology results:  11/16 BCx x2:  11/16 UCx:    Thank you for allowing pharmacy to be a part of this patient's care.  Lynelle Doctor 06/03/2020 8:44 AM

## 2020-06-03 NOTE — ED Provider Notes (Signed)
[place] DEPT Provider Note   CSN: [id number] Arrival date & time: 06/03/20  0347   Time seen 4:10 AM  History Chief Complaint  Patient presents with  . Fever   Level 5 caveat for altered mental status  [name] is a 74 y.o. female.  HPI   Patient presents from home.  Evidently husband was not a good historian but states she is not her normal self.  She was felt to have a fever by EMS. She recently had an abdominal aortogram with bilateral lower extremity runoff using carbon dioxide contrast.  This was done due to nonhealing wounds on both lower legs.  She was found to have bilateral flush superficial femoral artery occlusions.  Dr. [name] noted that she was a very poor operative candidate.  She is on home oxygen and has multiple cardiac comorbidities and is nearing end-stage renal disease despite having a previously transplanted kidney.  He states she was not a candidate for percutaneous intervention because of flush SFA occlusion on both sides.  He discussed with patient and her husband that her only option at this point would be continued local wound care and if they do not heal she may need to decide for unilateral or bilateral above-knee amputation.  PCP Plotnikov, [name], MD   Past Medical History:  Diagnosis Date  . Acute CHF (congestive heart failure) ([place]) 12/2018  . Adenocarcinoma of lung, stage 1, right ([place]) 11/25/2017  . Anemia, iron deficiency    of chronic disease  . Aortic stenosis    a. Severe AS by echo 11/2012.  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 Asystole ([place])    a. During ENT surgery 2005: developed marked asystole requiring CPR, felt due to vagal reaction (cath nonobst dz).  . Carotid artery disease ([place])    a. Carotid Dopplers performed in August 2013 showed 40-59% left stenosis and 0-39% right; f/u recommended in 2 years.   . Cerebrovascular accident [facility]) 2009   a. LMCA infarct felt  embolic 4259, maintained on chronic coumadin.; denies residual on 04/05/2013  . Cholelithiasis   . Chronic Persistent Atrial Fibrillation 12/31/2008   Qualifier: Diagnosis of  By: [name]    . Coronary artery disease 05/2002   a. Ant MI 2003 s/p PTCA/stent to RCA.   . Diverticulosis of colon   . Esophagitis, reflux   . ESRD (end stage renal disease) ([place])    a. Mass on L kidney per pt s/p nephrectomy - pt states not cancer - [facility] notes indicate ESRD due to HTN/DM - was previously on HD. b. Kidney transplant 02/2011.  Marland Kitchen GERD (gastroesophageal reflux disease)   . Gout   . Hearing loss   . Helicobacter pylori (H. pylori) infection    hx of  . Hemorrhoids   . Hx of colonic polyps    adenomatous  . Hyperlipidemia   . Hypertension   . Lung nodule seen on imaging study 04/07/2013   1.0 cm ground glass opacity RUL  . Myocardial infarction ([place]) 2003  . Pericardial effusion    a. Small by echo 11/2011.  . S/P aortic valve replacement with bioprosthetic valve and maze procedure 04/12/2013   100mm [facility] Ease bovine pericardial tissue valve   . S/P Maze operation for atrial fibrillation 04/12/2013   Complete bilateral atrial lesion set using bipolar radiofrequency and cryothermy ablation with clipping of LA appendage  . Sleep apnea    Pt says testing  was positive, intolerant of CPAP.  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  . Stroke ([place])   . Type II diabetes mellitus [facility])     Patient Active Problem List   Diagnosis Date Noted  . Atrial fibrillation with RVR ([place]) 06/03/2020  . ESRD (end stage renal disease) ([place])   . Venous stasis ulcers of both lower extremities ([place]) 01/31/2020  . Venous stasis dermatitis of both lower extremities 01/31/2020  . Claudication of both lower extremities ([place]) 01/31/2020  . Chronic  venous insufficiency 12/31/2019  . Heart failure ([place]) 10/02/2019  . Dyspnea 08/20/2019  . COPD (chronic obstructive pulmonary disease) ([place]) 08/20/2019  . Hematoma of arm, left, subsequent encounter 07/23/2019  . Obesity (BMI 30-39.9) 12/20/2018  . Acute respiratory failure with hypoxia ([place]) 12/19/2018  . Gait disorder 11/22/2018  . History of seizure disorder 09/12/2018  . History of stroke 09/12/2018  . Edema 08/28/2018  . Hypertension secondary to other renal disorders 03/22/2018  . Proteinuria 03/22/2018  . Malignant neoplasm of lung ([place]) 03/22/2018  . Breast pain in female 02/04/2018  . Lung cancer ([place]) 11/30/2017  . Adenocarcinoma of lung, stage 1, right ([place]) 11/25/2017  . Chronic respiratory failure with hypoxia ([place]) 12/27/2016  . Acute on chronic diastolic CHF (congestive heart failure) ([place]) 12/27/2016  . Left upper extremity swelling 12/27/2016  . Pain of left breast 12/27/2016  . Arm muscle atrophy 11/16/2016  . Insomnia 02/25/2016  . Renal cyst 11/21/2015  . Renal mass, right 11/10/2015  . Shoulder pain, right 08/13/2015  . Weight gain 08/12/2014  . Multinodular goiter 05/01/2014  . Encounter for therapeutic drug monitoring 08/14/2013  . S/P aortic valve replacement with bioprosthetic valve and maze procedure 04/12/2013  . Nodule of right lung 04/07/2013  . Nodule of left lung 04/07/2013  . Pain in limb 05/22/2012  . Dermatitis 01/26/2012  . Long term (current) use of anticoagulants 08/17/2011  . Anemia in chronic renal disease 05/13/2011  . Hypokalemia 04/26/2011  . Immunosuppression ([place]) 04/26/2011  . Hypertension associated with diabetes ([place]) 04/26/2011  . Renal transplant recipient 03/16/2011  . Low back pain 02/23/2011  . Hypersalivation 11/24/2010  . AORTIC STENOSIS 08/20/2010  . DISTURBANCE OF SALIVARY SECRETION 07/28/2010  . NAUSEA 04/14/2010  . SMOKER 10/07/2009  . ALOPECIA 10/07/2009  . CAROTID STENOSIS 03/04/2009  . AF (paroxysmal atrial  fibrillation) ([place]) 12/31/2008  . NEOPLASM, MALIGNANT, KIDNEY 07/02/2008  . APHASIA DUE TO CEREBROVASCULAR DISEASE 07/02/2008  . Chronic fatigue 05/21/2008  . Hyperlipidemia LDL goal <70 09/27/2007  . Constipation 09/27/2007  . SLEEP APNEA 09/27/2007  . CHOLELITHIASIS 06/09/2007  . HELICOBACTER PYLORI INFECTION, HX OF 06/08/2007  . Type 2 diabetes mellitus with chronic kidney disease, with long-term current use of insulin ([place]) 05/23/2007  . Gout 05/23/2007  . 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 MYOCARDIAL INFARCTION, HX OF 05/23/2007  . Coronary atherosclerosis 05/23/2007  . GERD 05/23/2007  . COLONIC POLYPS, HX OF 05/23/2007  . DIVERTICULOSIS, COLON 07/01/2005    Past Surgical History:  Procedure Laterality Date  . ABDOMINAL AORTOGRAM W/LOWER EXTREMITY Bilateral 05/30/2020   Procedure: ABDOMINAL AORTOGRAM W/LOWER EXTREMITY;  Surgeon: Elam Dutch, MD;  Location: [place] CV LAB;  Service: Cardiovascular;  Laterality: Bilateral;  . AORTIC VALVE REPLACEMENT N/A 04/12/2013   Procedure: AORTIC VALVE REPLACEMENT (AVR);  Surgeon: Rexene Alberts, MD;  Location: [facility]  OR;  Service: Open Heart Surgery;  Laterality: N/A;  . ARTERIOVENOUS GRAFT PLACEMENT Left   . ARTERIOVENOUS GRAFT PLACEMENT Left    "I've had 2 on my left; had one removed" (04/05/2013)   . ARTERY EXPLORATION Right 04/11/2013   Procedure: ARTERY EXPLORATION;  Surgeon: Rexene Alberts, MD;  Location: [place];  Service: Open Heart Surgery;  Laterality: Right;  Right carotid artery exploration  . AV FISTULA PLACEMENT Right   . AV FISTULA REPAIR Right    "took it out" ((/18/2014)  . CARDIOVERSION  05/29/2012   Procedure: CARDIOVERSION;  Surgeon: Lelon Perla, MD;  Location: [facility] ENDOSCOPY;  Service: Cardiovascular;  Laterality: N/A;  . CHOLECYSTECTOMY  2009   with hernia removal  . CORONARY ANGIOPLASTY WITH STENT PLACEMENT Right    coronary artery  . INSERTION OF DIALYSIS CATHETER Bilateral    "over the  years; took them both out" (04/05/2013)  . INTRAOPERATIVE TRANSESOPHAGEAL ECHOCARDIOGRAM N/A 04/11/2013   Procedure: INTRAOPERATIVE TRANSESOPHAGEAL ECHOCARDIOGRAM;  Surgeon: Rexene Alberts, MD;  Location: [place];  Service: Open Heart Surgery;  Laterality: N/A;  . INTRAOPERATIVE TRANSESOPHAGEAL ECHOCARDIOGRAM N/A 04/12/2013   Procedure: INTRAOPERATIVE TRANSESOPHAGEAL ECHOCARDIOGRAM;  Surgeon: Rexene Alberts, MD;  Location: [place];  Service: Open Heart Surgery;  Laterality: N/A;  . KIDNEY TRANSPLANT  03/16/11  . LEFT AND RIGHT HEART CATHETERIZATION WITH CORONARY ANGIOGRAM N/A 04/06/2013   Procedure: LEFT AND RIGHT HEART CATHETERIZATION WITH CORONARY ANGIOGRAM;  Surgeon: Blane Ohara, MD;  Location: [facility] CATH LAB;  Service: Cardiovascular;  Laterality: N/A;  . MAZE N/A 04/12/2013   Procedure: MAZE;  Surgeon: Rexene Alberts, MD;  Location: [place];  Service: Open Heart Surgery;  Laterality: N/A;  . NASAL RECONSTRUCTION WITH SEPTAL REPAIR     "took it out" (04/05/2013)  . NEPHRECTOMY Left 2010   no CA on bx  . TONSILLECTOMY    . TOTAL ABDOMINAL HYSTERECTOMY    . TUBAL LIGATION       OB History   No obstetric history on file.     Family History  Problem Relation Age of Onset  . Stroke Father   . Hypertension Mother   . Diabetes Other   . Diabetes Maternal Grandmother   . Diabetes Son   . Crohn's disease Other        grandson   . Breast cancer Neg Hx   . Stomach cancer Neg Hx   . Esophageal cancer Neg Hx   . Colon cancer Neg Hx   . Pancreatic cancer Neg Hx     Social History   Tobacco Use  . Smoking status: Former Smoker    Packs/day: 1.00    Years: 30.00    Pack years: 30.00    Types: Cigarettes    Quit date: 07/19/2001    Years since quitting: 18.8  . Smokeless tobacco: Never Used  Vaping Use  . Vaping Use: Never used  Substance Use Topics  . Alcohol use: No  . Drug use: No    Home Medications Prior to Admission medications   Medication Sig Start Date End Date Taking?  Authorizing Provider  acetaminophen (TYLENOL) 500 MG tablet Take 500 mg by mouth every 6 (six) hours as needed for mild pain or headache.    [provider]  amoxicillin (AMOXIL) 500 MG capsule TAKE 4 CAPSULES BY MOUTH 1 HOUR PRIOR TO DENTAL WORK Patient taking differently: Take 2,000 mg by mouth once. 1 HOUR PRIOR TO DENTAL WORK 03/05/20   Plotnikov, [employer]  V, MD  clobetasol ointment (TEMOVATE) 4.00 % Apply 1 application topically 2 (two) times daily. Patient not taking: Reported on 05/26/2020 01/31/20   Janith Lima, MD  dapsone 25 MG tablet Take 25 mg by mouth daily.     [provider]  gabapentin (NEURONTIN) 300 MG capsule Take 1 capsule (300 mg total) by mouth 3 (three) times daily. 10/22/19   Plotnikov, [name], MD  insulin glargine, 1 Unit Dial, (TOUJEO SOLOSTAR) 300 UNIT/ML Solostar Pen Inject 60 Units into the skin every morning. Titrate up by 1 unit a day if needed for goal sugars of 100-130 up to 50 units a day max 02/01/20   Janith Lima, MD  insulin lispro (HUMALOG KWIKPEN) 200 UNIT/ML KwikPen Inject 10 Units into the skin with breakfast, with lunch, and with evening meal. 02/01/20   Janith Lima, MD  Insulin Pen Needle (B-D UF III MINI PEN NEEDLES) 31G X 5 MM MISC USE TO ADMINISTER INSULIN FOUR TIMES A DAY DX E11.9 11/11/17   Plotnikov, [name], MD  levothyroxine (SYNTHROID) 25 MCG tablet TAKE 2 TABLETS (50 MCG TOTAL) BY MOUTH DAILY. 01/25/20   Plotnikov, [name], MD  lidocaine (XYLOCAINE) 5 % ointment Apply 1 application topically 4 (four) times daily as needed. Patient taking differently: Apply 1 application topically daily as needed (to numb sores).  02/27/20   Plotnikov, [name], MD  metoprolol tartrate (LOPRESSOR) 25 MG tablet Take 0.5 tablets (12.5 mg total) by mouth 2 (two) times daily. 08/22/19   Sherran Needs, NP  [facility] DELICA LANCETS 86P MISC Use to check blood sugars three times a day DX E11.9 10/12/17   Plotnikov, [name], MD  ONETOUCH VERIO  test strip USE TO TEST 3 TIMES DAILY. DX E11.9 11/27/19   Plotnikov, [name], MD  oxyCODONE-acetaminophen (PERCOCET/ROXICET) 5-325 MG tablet Take 1 tablet by mouth every 8 (eight) hours as needed for severe pain. 04/22/20   Plotnikov, [name], MD  OXYGEN Inhale 3 L into the lungs daily as needed (for shortness of breath).     [provider]  potassium chloride SA (KLOR-CON M20) 20 MEQ tablet Take 1 tablet (20 mEq total) by mouth daily. 09/19/19 05/26/20  Deberah Pelton, NP  predniSONE (DELTASONE) 5 MG tablet Take 5 mg by mouth daily.  03/29/19 08/19/20  [provider]  Propylene Glycol (SYSTANE BALANCE) 0.6 % SOLN Place 1-2 drops into both eyes in the morning, at noon, and at bedtime.     [provider]  rosuvastatin (CRESTOR) 20 MG tablet Take 1 tablet (20 mg total) by mouth daily. 03/21/19   Lelon Perla, MD  SANTYL ointment Apply 1 application topically daily. 05/22/20   [provider]  tacrolimus (PROGRAF) 1 MG capsule Take 3 mg by mouth 2 (two) times daily.     [provider]  torsemide (DEMADEX) 100 MG tablet Take 2 tablets (200 mg total) by mouth daily. 12/31/19   Plotnikov, [name], MD  triamcinolone cream (KENALOG) 0.1 % Apply 1 application topically 4 (four) times daily. Patient not taking: Reported on 05/26/2020 12/05/19   Plotnikov, [name], MD  warfarin (COUMADIN) 5 MG tablet TAKE 1/2 TO 1 TABLET BY MOUTH DAILY AS DIRECTED BY COUMADIN CLINIC Patient taking differently: Take 2.5-5 mg by mouth See admin instructions. Take 2.5 mg every day EXCEPT: Thursday take 5 mg at bedtime 11/16/19   Lelon Perla, MD    Allergies    Ibuprofen, Sulfamethoxazole-trimethoprim, Sulfonamide derivatives,  Tape, Tramadol, Doxycycline, Hydrocil [psyllium], Bactrim, and Red dye  Review of Systems   Review of Systems  Unable to perform ROS: Mental status change    Physical Exam Updated Vital Signs BP (!) 150/92   Pulse (!) 124   Temp (!) 101.8 F  (38.8 C) (Rectal)   Resp (!) 23   Wt 82 kg   SpO2 94%   BMI 35.31 kg/m   Physical Exam Vitals and nursing note reviewed.  Constitutional:      General: She is in acute distress.     Appearance: She is obese.     Comments: Appears to be in pain or in distress but does not verbalize withdrawal  HENT:     Head: Normocephalic and atraumatic.     Right Ear: External ear normal.     Left Ear: External ear normal.  Eyes:     Extraocular Movements: Extraocular movements intact.     Conjunctiva/sclera: Conjunctivae normal.     Pupils: Pupils are equal, round, and reactive to light.  Cardiovascular:     Rate and Rhythm: Regular rhythm. Tachycardia present.     Heart sounds: Normal heart sounds.  Pulmonary:     Effort: Tachypnea, accessory muscle usage and respiratory distress present.     Breath sounds: Normal breath sounds.  Musculoskeletal:     Cervical back: Normal range of motion.     Comments: Patient is noted to have diffuse redness warmth, and swelling of both lower extremities.  She has clear fluid-filled vesicles on the anterior right lower extremity.  She has 2 ulcers on the lower anterior left lower leg that are 3 x 4 cm in size and 2 x 2 cm in size and they are deep into the subcutaneous fat tissue.  Skin:    General: Skin is dry.     Comments: Patient feels hot to touch skin is hot to touch  Neurological:     Mental Status: She is alert.     Comments: Unable to assess  Psychiatric:        Mood and Affect: Mood is anxious.        Speech: She is noncommunicative.        Behavior: Behavior is agitated.    Right lower extremity     Left Lower extremity     ED Results / Procedures / Treatments   Labs (all labs ordered are listed, but only abnormal results are displayed) Results for orders placed or performed during the hospital encounter of 06/03/20  Comprehensive metabolic panel  Result Value Ref Range   Sodium 141 135 - 145 mmol/L   Potassium 4.6 3.5 - 5.1  mmol/L   Chloride 100 98 - 111 mmol/L   CO2 30 22 - 32 mmol/L   Glucose, Bld 239 (H) 70 - 99 mg/dL   BUN 61 (H) 8 - 23 mg/dL   Creatinine, Ser 2.92 (H) 0.44 - 1.00 mg/dL   Calcium 10.2 8.9 - 10.3 mg/dL   Total Protein 7.2 6.5 - 8.1 g/dL   Albumin 2.8 (L) 3.5 - 5.0 g/dL   AST 23 15 - 41 U/L   ALT 15 0 - 44 U/L   Alkaline Phosphatase 136 (H) 38 - 126 U/L   Total Bilirubin 1.1 0.3 - 1.2 mg/dL   GFR, Estimated 16 (L) >60 mL/min   Anion gap 11 5 - 15  Lactic acid, plasma  Result Value Ref Range   Lactic Acid, Venous 1.7 0.5 - 1.9 mmol/L  Lactic acid, plasma  Result Value Ref Range   Lactic Acid, Venous 1.5 0.5 - 1.9 mmol/L  CBC with Differential  Result Value Ref Range   WBC 14.2 (H) 4.0 - 10.5 K/uL   RBC 5.21 (H) 3.87 - 5.11 MIL/uL   Hemoglobin 12.5 12.0 - 15.0 g/dL   HCT 41.7 36 - 46 %   MCV 80.0 80.0 - 100.0 fL   MCH 24.0 (L) 26.0 - 34.0 pg   MCHC 30.0 30.0 - 36.0 g/dL   RDW 21.4 (H) 11.5 - 15.5 %   Platelets 199 150 - 400 K/uL   nRBC 0.0 0.0 - 0.2 %   Neutrophils Relative % 87 %   Neutro Abs 12.3 (H) 1.7 - 7.7 K/uL   Lymphocytes Relative 3 %   Lymphs Abs 0.5 (L) 0.7 - 4.0 K/uL   Monocytes Relative 9 %   Monocytes Absolute 1.3 (H) 0.1 - 1.0 K/uL   Eosinophils Relative 0 %   Eosinophils Absolute 0.0 0.0 - 0.5 K/uL   Basophils Relative 0 %   Basophils Absolute 0.0 0.0 - 0.1 K/uL   Immature Granulocytes 1 %   Abs Immature Granulocytes 0.12 (H) 0.00 - 0.07 K/uL  Protime-INR  Result Value Ref Range   Prothrombin Time 16.1 (H) 11.4 - 15.2 seconds   INR 1.3 (H) 0.8 - 1.2  Urinalysis, Routine w reflex microscopic Urine, Catheterized  Result Value Ref Range   Color, Urine YELLOW YELLOW   APPearance CLEAR CLEAR   Specific Gravity, Urine 1.012 1.005 - 1.030   pH 7.0 5.0 - 8.0   Glucose, UA 150 (A) NEGATIVE mg/dL   Hgb urine dipstick NEGATIVE NEGATIVE   Bilirubin Urine NEGATIVE NEGATIVE   Ketones, ur NEGATIVE NEGATIVE mg/dL   Protein, ur >=300 (A) NEGATIVE mg/dL    Nitrite NEGATIVE NEGATIVE   Leukocytes,Ua SMALL (A) NEGATIVE   RBC / HPF 0-5 0 - 5 RBC/hpf   WBC, UA 21-50 0 - 5 WBC/hpf   Bacteria, UA NONE SEEN NONE SEEN   Squamous Epithelial / LPF 0-5 0 - 5  APTT  Result Value Ref Range   aPTT 32 24 - 36 seconds  Blood gas, venous  Result Value Ref Range   pH, Ven 7.389 7.25 - 7.43   pCO2, Ven 54.0 44 - 60 mmHg   pO2, Ven 26.8 (LL) 32 - 45 mmHg   Bicarbonate 31.9 (H) 20.0 - 28.0 mmol/L   Acid-Base Excess 6.0 (H) 0.0 - 2.0 mmol/L   O2 Saturation 41.5 %   Patient temperature 98.6    *Note: Due to a large number of results and/or encounters for the requested time period, some results have not been displayed. A complete set of results can be found in Results Review.   Laboratory interpretation all normal except possible UTI, normal VBG except for hypoxia (patient is on oxygen at home), hyperglycemia, stable renal insufficiency, leukocytosis   EKG EKG Interpretation  Date/Time:  Tuesday June 03 2020 04:28:03 EST Ventricular Rate:  126 PR Interval:    QRS Duration: 98 QT Interval:  311 QTC Calculation: 451 R Axis:   67 Text Interpretation: Atrial flutter with predominant 2:1 AV block Abnormal R-wave progression, late transition Borderline ST depression, diffuse leads Abnormal T, consider ischemia, diffuse leads Since last tracing rate faster 01 Oct 2019 Confirmed by Rolland Porter [phone]) on 06/03/2020 4:48:11 AM   Radiology DG Chest Port 1 View  Result Date: 06/03/2020 CLINICAL DATA:  Suspected sepsis EXAM: PORTABLE CHEST 1 VIEW  COMPARISON:  10/01/2019 FINDINGS: Cardiomegaly. Aortic valve replacement and left atrial clipping. Congested appearance of central vessels with diffuse interstitial opacity and cephalized blood flow. No visible effusion or pneumothorax IMPRESSION: CHF pattern. Electronically Signed   By: Monte Fantasia M.D.   On: 06/03/2020 04:55     PERIPHERAL VASCULAR CATHETERIZATION  Result Date: 05/30/2020 Procedure:  Abdominal aortogram with bilateral lower extremity runoff using carbon dioxide contrast Preoperative diagnosis: Nonhealing wounds bilateral lower extremities Postoperative diagnosis: Same Anesthesia: Local Operative findings: 1.  Bilateral flush superficial femoral artery occlusions 2.  One-vessel very diseased calcified peroneal runoff bilaterally  Operative management: Patient overall is a very poor operative candidate.  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  She is not a candidate for percutaneous intervention because she has a flush SFA occlusion on both sides.  Her only options at this point would be continued local wound care to see if the wounds will heal.  These have not healed in several months so if eventually she decides the pain from the wounds or the wound burden is too much for her she would be a candidate for bilateral or unilateral above-knee amputation.  I discussed all of this with the patient and her husband today.  She will follow up with me on an as-needed basis. Ruta Hinds, MD [facility] Office: [phone]    Procedures .Critical Care Performed by: Rolland Porter, MD Authorized by: Rolland Porter, MD   Critical care provider statement:    Critical care time (minutes):  40   Critical care was necessary to treat or prevent imminent or life-threatening deterioration of the following conditions:  Cardiac failure, sepsis and respiratory failure   Critical care was time spent personally by me on the following activities:  Discussions with consultants, examination of patient, ordering and review of laboratory studies, ordering and review of radiographic studies, pulse oximetry, re-evaluation of patient's condition and review of old charts   (including critical care time)  Medications Ordered in ED Medications  lactated ringers infusion ( Intravenous  Stopped 06/03/20 0604)  piperacillin-tazobactam (ZOSYN) IVPB 3.375 g (3.375 g Intravenous New Bag/Given 06/03/20 0537)  diltiazem (CARDIZEM) 1 mg/mL load via infusion 10 mg (10 mg Intravenous Bolus from Bag 06/03/20 0613)    And  diltiazem (CARDIZEM) 125 mg in dextrose 5% 125 mL (1 mg/mL) infusion (10 mg/hr Intravenous Rate/Dose Verify 06/03/20 0646)  ceFEPIme (MAXIPIME) 2 g in sodium chloride 0.9 % 100 mL IVPB (0 g Intravenous Stopped 06/03/20 0617)  vancomycin (VANCOCIN) IVPB 1000 mg/200 mL premix (0 mg Intravenous Stopped 06/03/20 0617)  acetaminophen (TYLENOL) suppository 650 mg (650 mg Rectal Given 06/03/20 0605)  furosemide (LASIX) injection 80 mg (80 mg Intravenous Given 06/03/20 5027)    ED Course  I have reviewed the triage vital signs and the nursing notes.  Pertinent labs & imaging results that were available during my care of the patient were reviewed by me and considered in my medical decision making (see chart for details).    MDM Rules/Calculators/A&P                          Patient was initially just groaning and not verbally responding when she first arrived.  She could not communicate whether she was in pain.  Patient was started on sepsis protocol however after reviewing her chest x-ray her IV fluid boluses were stopped.  Patient has chronic atrial fib and tonight with her fever her heart rate is in the 120s.  This may have tipped her into some congestive heart failure.  Her rectal temperature was 101.8.  She was given rectal suppository because I was not sure she be able to swallow without aspirating.  She was started on a Cardizem bolus and drip to control her rate of her atrial fibrillation.  She was started on IV antibiotics for possible cellulitis of her lower extremities.  Cultures were obtained including a urine culture, her urine was suspicious for UTI.   5:45 AM nurses report patient sometimes sounds clear and is able to communicate.  6:11 AM Dr Tonie Griffith,  hospitalist will admit.  6:30 AM when I go in the room patient has her eyes open now and makes eye contact and appears more aware of what is going on.    Final Clinical Impression(s) / ED Diagnoses Final diagnoses:  Urinary tract infection without hematuria, site unspecified  Ulcer of left lower extremity with fat layer exposed ([place])  Atrial fibrillation with rapid ventricular response ([facility])  Fever, unspecified fever cause  Cellulitis of both lower extremities    Rx / DC Orders  Plan admission  Rolland Porter, MD, Barbette Or, MD 06/03/20 [phone]

## 2020-06-03 NOTE — ED Notes (Signed)
MD Neysa [name] made aware of Troponin 174. No new orders at this time.

## 2020-06-03 NOTE — Consult Note (Signed)
Cardiology Consultation:   [name]; [id number]; Nov 27, 1945   Admit date: 06/03/2020 Date of Consult: 06/03/2020  Primary Care Provider: [name], MD Primary Cardiologist: [name], MD 02/25/2020 Primary Electrophysiologist:  None   Patient Profile:   [name] is a 74 y.o. 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  History of Present Illness:   [name] came to the hospital for abd aortogram w/ LE runoff on 11/12. Dr [name] performed the procedure. She is not a candidate for percutaneous intervention because she has a flush SFA occlusion on both sides. She is not a surgical candidate. Her only options at this point would be continued local wound care, may have to consider amputation if wounds do not heal. During the procedure, her HR was documented in the 40s-80s, but no comment on the rhythm.  [name] presented to the ER today with fever, upper airway congestion, she is felt to have sepsis without septic shock.  She also has acute on chronic hypoxemic respiratory failure, likely from CHF.  The sepsis is suspected to be from lower extremity purulent cellulitis.  There is also concern for possible endocarditis.  She has been placed on stronger antibiotics, cultures done, wound care called, and Palliative Care consult has also been called.  She was in rapid Afib on admission, cards asked to see.   [name] can communicate some, but struggles with this.  She denies palpitations and does not seem to be aware of the atrial  fibrillation.  She has been sick to her stomach.  She has a great deal of left lower extremity pain, says she has gout as well as her wound issues.  She agrees that she is a little more short of breath than usual.  She is not able to provide much information at all.   Past Medical History:  Diagnosis Date  . Acute CHF (congestive heart failure) ([place]) 12/2018  . Adenocarcinoma of lung, stage 1, right ([place]) 11/25/2017  . Anemia, iron deficiency    of chronic disease  . Aortic stenosis    a. Severe AS by echo 11/2012.  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 Asystole ([place])    a. During ENT surgery 2005: developed marked asystole requiring CPR, felt due to vagal reaction (cath nonobst dz).  . Carotid artery disease ([place])    a. Carotid Dopplers performed in August 2013 showed 40-59% left stenosis and 0-39% right; f/u recommended in 2 years.   . Cerebrovascular accident [facility]) 2009   a. LMCA infarct felt embolic 6789, maintained on chronic coumadin.; denies residual on 04/05/2013  . Cholelithiasis   . Chronic Persistent Atrial Fibrillation 12/31/2008   Qualifier: Diagnosis of  By: [name]    . Coronary artery disease 05/2002   a. Ant MI 2003 s/p PTCA/stent to RCA.   . Diverticulosis of colon   . Esophagitis, reflux   . ESRD (end stage renal disease) ([place])    a. Mass on L kidney per pt s/p nephrectomy - pt states not cancer - [facility] notes  indicate ESRD due to HTN/DM - was previously on HD. b. Kidney transplant 02/2011.  Marland Kitchen GERD (gastroesophageal reflux disease)   . Gout   . Hearing loss   . Helicobacter pylori (H. pylori) infection    hx of  . Hemorrhoids   . Hx of colonic polyps    adenomatous  . Hyperlipidemia   . Hypertension   . Lung nodule seen on imaging study 04/07/2013   1.0 cm ground glass opacity RUL  . Myocardial infarction ([place]) 2003  . Pericardial effusion    a. Small by echo 11/2011.  . S/P aortic valve replacement with bioprosthetic valve and maze  procedure 04/12/2013   42mm [facility] Ease bovine pericardial tissue valve   . S/P Maze operation for atrial fibrillation 04/12/2013   Complete bilateral atrial lesion set using bipolar radiofrequency and cryothermy ablation with clipping of LA appendage  . Sleep apnea    Pt says testing was positive, intolerant of CPAP.  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  . Stroke ([place])   . Type II diabetes mellitus ([place])     Past Surgical History:  Procedure Laterality Date  . ABDOMINAL AORTOGRAM W/LOWER EXTREMITY Bilateral 05/30/2020   Procedure: ABDOMINAL AORTOGRAM W/LOWER EXTREMITY;  Surgeon: Elam Dutch, MD;  Location: [place] CV [facility];  Service: Cardiovascular;  Laterality: Bilateral;  . AORTIC VALVE REPLACEMENT N/A 04/12/2013   Procedure: AORTIC VALVE REPLACEMENT (AVR);  Surgeon: Rexene Alberts, MD;  Location: [place];  Service: Open Heart Surgery;  Laterality: N/A;  . ARTERIOVENOUS GRAFT PLACEMENT Left   . ARTERIOVENOUS GRAFT PLACEMENT Left    "I've had 2 on my left; had one removed" (04/05/2013)   . ARTERY EXPLORATION Right 04/11/2013   Procedure: ARTERY EXPLORATION;  Surgeon: Rexene Alberts, MD;  Location: [place];  Service: Open Heart Surgery;  Laterality: Right;  Right carotid artery exploration  . AV FISTULA PLACEMENT Right   . AV FISTULA REPAIR Right    "took it out" ((/18/2014)  . CARDIOVERSION  05/29/2012   Procedure: CARDIOVERSION;  Surgeon: Lelon Perla, MD;  Location: [facility] ENDOSCOPY;  Service: Cardiovascular;  Laterality: N/A;  . CHOLECYSTECTOMY  2009   with hernia removal  . CORONARY ANGIOPLASTY WITH STENT PLACEMENT Right    coronary artery  . INSERTION OF DIALYSIS CATHETER Bilateral    "over the years; took them both out" (04/05/2013)  . INTRAOPERATIVE TRANSESOPHAGEAL ECHOCARDIOGRAM N/A 04/11/2013    Procedure: INTRAOPERATIVE TRANSESOPHAGEAL ECHOCARDIOGRAM;  Surgeon: Rexene Alberts, MD;  Location: [place];  Service: Open Heart Surgery;  Laterality: N/A;  . INTRAOPERATIVE TRANSESOPHAGEAL ECHOCARDIOGRAM N/A 04/12/2013   Procedure: INTRAOPERATIVE TRANSESOPHAGEAL ECHOCARDIOGRAM;  Surgeon: Rexene Alberts, MD;  Location: [place];  Service: Open Heart Surgery;  Laterality: N/A;  . KIDNEY TRANSPLANT  03/16/11  . LEFT AND RIGHT HEART CATHETERIZATION WITH CORONARY ANGIOGRAM N/A 04/06/2013   Procedure: LEFT AND RIGHT HEART CATHETERIZATION WITH CORONARY ANGIOGRAM;  Surgeon: Blane Ohara, MD;  Location: [facility] CATH LAB;  Service: Cardiovascular;  Laterality: N/A;  . MAZE N/A 04/12/2013   Procedure: MAZE;  Surgeon: Rexene Alberts, MD;  Location: [place];  Service: Open Heart Surgery;  Laterality: N/A;  . NASAL RECONSTRUCTION WITH SEPTAL REPAIR     "took it out" (04/05/2013)  . NEPHRECTOMY Left 2010   no CA on bx  . TONSILLECTOMY    .  TOTAL ABDOMINAL HYSTERECTOMY    . TUBAL LIGATION       Prior to Admission medications   Medication Sig Start Date End Date Taking? Authorizing Provider  acetaminophen (TYLENOL) 500 MG tablet Take 500 mg by mouth every 6 (six) hours as needed for mild pain or headache.    [provider]  amoxicillin (AMOXIL) 500 MG capsule TAKE 4 CAPSULES BY MOUTH 1 HOUR PRIOR TO DENTAL WORK Patient not taking: Reported on 06/03/2020 03/05/20   Plotnikov, [name], MD  clobetasol ointment (TEMOVATE) 6.96 % Apply 1 application topically 2 (two) times daily. Patient not taking: Reported on 05/26/2020 01/31/20   Janith Lima, MD  dapsone 25 MG tablet Take 25 mg by mouth daily.     [provider]  gabapentin (NEURONTIN) 300 MG capsule Take 1 capsule (300 mg total) by mouth 3 (three) times daily. 10/22/19   Plotnikov, [name], MD  insulin glargine, 1 Unit Dial, (TOUJEO SOLOSTAR) 300 UNIT/ML Solostar Pen Inject 60 Units into the skin every morning. Titrate up by 1 unit a day if  needed for goal sugars of 100-130 up to 50 units a day max 02/01/20   Janith Lima, MD  insulin lispro (HUMALOG KWIKPEN) 200 UNIT/ML KwikPen Inject 10 Units into the skin with breakfast, with lunch, and with evening meal. 02/01/20   Janith Lima, MD  Insulin Pen Needle (B-D UF III MINI PEN NEEDLES) 31G X 5 MM MISC USE TO ADMINISTER INSULIN FOUR TIMES A DAY DX E11.9 11/11/17   Plotnikov, [name], MD  levothyroxine (SYNTHROID) 25 MCG tablet TAKE 2 TABLETS (50 MCG TOTAL) BY MOUTH DAILY. 01/25/20   Plotnikov, [name], MD  lidocaine (XYLOCAINE) 5 % ointment Apply 1 application topically 4 (four) times daily as needed. Patient taking differently: Apply 1 application topically daily as needed (to numb sores).  02/27/20   Plotnikov, [name], MD  metoprolol tartrate (LOPRESSOR) 25 MG tablet Take 0.5 tablets (12.5 mg total) by mouth 2 (two) times daily. 08/22/19   Sherran Needs, NP  [facility] DELICA LANCETS 29B MISC Use to check blood sugars three times a day DX E11.9 10/12/17   Plotnikov, [name], MD  ONETOUCH VERIO test strip USE TO TEST 3 TIMES DAILY. DX E11.9 11/27/19   Plotnikov, [name], MD  oxyCODONE-acetaminophen (PERCOCET/ROXICET) 5-325 MG tablet Take 1 tablet by mouth every 8 (eight) hours as needed for severe pain. 04/22/20   Plotnikov, [name], MD  OXYGEN Inhale 3 L into the lungs daily as needed (for shortness of breath).     [provider]  potassium chloride SA (KLOR-CON M20) 20 MEQ tablet Take 1 tablet (20 mEq total) by mouth daily. 09/19/19 05/26/20  Deberah Pelton, NP  predniSONE (DELTASONE) 5 MG tablet Take 5 mg by mouth daily.  03/29/19 08/19/20  [provider]  Propylene Glycol (SYSTANE BALANCE) 0.6 % SOLN Place 1-2 drops into both eyes in the morning, at noon, and at bedtime.     [provider]  rosuvastatin (CRESTOR) 20 MG tablet Take 1 tablet (20 mg total) by mouth daily. 03/21/19   Lelon Perla, MD  SANTYL ointment Apply 1 application topically  daily. 05/22/20   [provider]  tacrolimus (PROGRAF) 1 MG capsule Take 3 mg by mouth 2 (two) times daily.     [provider]  torsemide (DEMADEX) 100 MG tablet TAKE 1 TABLET BY MOUTH EVERY DAY 06/03/20   Plotnikov, [name], MD  triamcinolone  cream (KENALOG) 0.1 % Apply 1 application topically 4 (four) times daily. Patient not taking: Reported on 05/26/2020 12/05/19   Plotnikov, [name], MD  warfarin (COUMADIN) 5 MG tablet TAKE 1/2 TO 1 TABLET BY MOUTH DAILY AS DIRECTED BY COUMADIN CLINIC Patient taking differently: Take 2.5-5 mg by mouth See admin instructions. Take 2.5 mg every day EXCEPT: Thursday take 5 mg at bedtime 11/16/19   Lelon Perla, MD    Inpatient Medications: Scheduled Meds: . heparin  4,000 Units Intravenous Once  . insulin aspart  0-9 Units Subcutaneous TID WC  . insulin glargine  20 Units Subcutaneous Daily  . sodium chloride flush  3 mL Intravenous Q12H  . vancomycin variable dose per unstable renal function (pharmacist dosing)   Does not apply See admin instructions   Continuous Infusions: . diltiazem (CARDIZEM) infusion 12.5 mg/hr (06/03/20 0719)  . heparin    . meropenem (MERREM) IV     PRN Meds: acetaminophen **OR** acetaminophen, polyethylene glycol  Allergies:    Allergies  Allergen Reactions  . Ibuprofen Nausea And Vomiting  . Sulfamethoxazole-Trimethoprim Itching, Swelling and Rash    Swelling of the face  . Sulfonamide Derivatives Itching, Swelling and Rash    Swelling of the face  . Tape Rash    Paper tape is ok  . Tramadol Nausea And Vomiting  . Doxycycline Nausea Only  . Hydrocil [Psyllium] Nausea And Vomiting  . Bactrim Itching, Swelling and Rash  . Red Dye Itching and Rash    Social History:   Social History   Socioeconomic History  . Marital status: Married    Spouse name: Not on file  . Number of children: 5  . Years of education: 58  . Highest education level: Not on file  Occupational History  .  Occupation: retired    Fish farm manager: RETIRED  Tobacco Use  . Smoking status: Former Smoker    Packs/day: 1.00    Years: 30.00    Pack years: 30.00    Types: Cigarettes    Quit date: 07/19/2001    Years since quitting: 18.8  . Smokeless tobacco: Never Used  Vaping Use  . Vaping Use: Never used  Substance and Sexual Activity  . Alcohol use: No  . Drug use: No  . Sexual activity: Not Currently  Other Topics Concern  . Not on file  Social History Narrative   Patient signed a Designated Party Release to allow her spouse [name] and family and five children to have access to her medical records/information.     Patient lives with husband in a 2 story home.  Has 5 children. Retired from Personal assistant.  Education: 3 years of college.    Social Determinants of Health   Financial Resource Strain:   . Difficulty of Paying Living Expenses: Not on file  Food Insecurity: No Food Insecurity  . Worried About Charity fundraiser in the Last Year: Never true  . Ran Out of Food in the Last Year: Never true  Transportation Needs: No Transportation Needs  . Lack of Transportation (Medical): No  . Lack of Transportation (Non-Medical): No  Physical Activity: Inactive  . Days of Exercise per Week: 0 days  . Minutes of Exercise per Session: 0 min  Stress: Stress Concern Present  . Feeling of Stress : Rather much  Social Connections:   . Frequency of Communication with Friends and Family: Not on file  . Frequency of Social Gatherings with Friends and Family: Not on file  .  Attends Religious Services: Not on file  . Active Member of Clubs or Organizations: Not on file  . Attends Archivist Meetings: Not on file  . Marital Status: Not on file  Intimate Partner Violence: Not At Risk  . Fear of Current or Ex-Partner: No  . Emotionally Abused: No  . Physically Abused: No  . Sexually Abused: No    Family History:   Family History  Problem Relation Age of Onset  . Stroke Father   .  Hypertension Mother   . Diabetes Other   . Diabetes Maternal Grandmother   . Diabetes Son   . Crohn's disease Other        grandson   . Breast cancer Neg Hx   . Stomach cancer Neg Hx   . Esophageal cancer Neg Hx   . Colon cancer Neg Hx   . Pancreatic cancer Neg Hx    Family Status:  Family Status  Relation Name Status  . Father  Deceased at age 34       died of stroke  . Mother  Deceased  . Other  (Not Specified)  . MGM  Deceased  . Son  (Not Specified)  . MGF  Deceased  . PGM  Deceased  . PGF  Deceased  . Other  (Not Specified)  . Neg Hx  (Not Specified)    ROS:  Please see the history of present illness.  All other ROS reviewed and negative.     Physical Exam/Data:   Vitals:   06/03/20 0815 06/03/20 0845 06/03/20 0900 06/03/20 0915  BP: (!) 150/75 (!) 144/76 (!) 146/81 140/70  Pulse: (!) 107 (!) 106 (!) 101 (!) 101  Resp: 19 17 15  (!) 24  Temp:      TempSrc:      SpO2: 97% 97% 96% 97%  Weight:        Intake/Output Summary (Last 24 hours) at 06/03/2020 0945 Last data filed at 06/03/2020 0646 Gross per 24 hour  Intake 12.71 ml  Output --  Net 12.71 ml    Last 3 Weights 06/03/2020 05/30/2020 05/22/2020  Weight (lbs) 180 lb 12.4 oz 180 lb 160 lb  Weight (kg) 82 kg 81.647 kg 72.576 kg     Body mass index is 35.31 kg/m.   General:  Well nourished, well developed, female in no acute distress HEENT: normal Lymph: no adenopathy Neck: JVD -elevated, but unclear how much Endocrine:  No thryomegaly Vascular: No carotid bruits; upper extremity pulses 2+, not able to palpate lower extremity pulses Cardiac:  normal S1, S2; irregular rate and rhythm; 2/6 murmur Lungs: Rales bilaterally, no wheezing, rhonchi   Abd: soft, nontender, no hepatomegaly  Ext: no edema on the right, trace-1+ left edema Musculoskeletal:  No deformities, BUE and BLE strength normal and equal Skin: warm and dry  Neuro:  CNs 2-12 intact, no focal abnormalities noted Psych:  Normal affect    EKG:  The EKG was personally reviewed and demonstrates:  Atrial fib, HR 126 Telemetry:  Telemetry was personally reviewed and demonstrates:  Atrial fib initially RVR but rate has gradually improved overnight   CV studies:   ECHO: 10/02/2019 1. Left ventricular ejection fraction, by estimation, is 60 to 65%. The  left ventricle has low normal function. The left ventricle has no regional  wall motion abnormalities. There is mild left ventricular hypertrophy.  Left ventricular diastolic function  could not be evaluated. Left ventricular diastolic function could not be  evaluated. Elevated left ventricular  end-diastolic pressure.  2. Right ventricular systolic function is normal. The right ventricular  size is normal. There is severely elevated pulmonary artery systolic  pressure. The estimated right ventricular systolic pressure is 70.6 mmHg.  3. Left atrial size was moderately dilated.  4. Right atrial size was mildly dilated.  5. The mitral valve is abnormal. Mild to moderate mitral valve  regurgitation.  6. Tricuspid valve regurgitation is mild to moderate.  7. The aortic valve has been repaired/replaced. Aortic valve  regurgitation is trivial. There is a 23 mm Edwards bovine valve present in  the aortic position. Procedure Date: 04/12/2013. Aortic valve area, by VTI  measures 1.66 cm. Aortic valve mean  gradient measures 13.0 mmHg. Aortic valve Vmax measures 2.72 m/s.  8. The inferior vena cava is dilated in size with <50% respiratory  variability, suggesting right atrial pressure of 15 mmHg.   Comparison(s): Changes from prior study are noted. 12/20/2018: LVEF 60-65%,  RVSP 44 mmHg.   PV CATH: 05/30/2020 Operative findings: 1.  Bilateral flush superficial femoral artery occlusions  2.  One-vessel very diseased calcified peroneal runoff bilaterally  Operative details: After obtaining informed consent the patient was taken to the [place] lab.  The patient was placed in  supine position angio table.  Both groins were prepped and draped in usual sterile fashion.  Ultrasound was used to identify the right common femoral artery and femoral bifurcation.  There was heavy calcification of the vessel under ultrasound.  After several attempts using an introducer needle to get in the right common femoral artery I was able to finally successfully cannulate this with a micropuncture needle and the micropuncture wire advanced into the right iliac system under fluoroscopy.  Micropuncture sheath was then advanced over the guidewire.  Guidewire was removed and the wire swapped out for an 035 versa core wire which was advanced in the abdominal aorta.  Micropuncture sheath was then removed over the wire and swapped out for a 5 [place] straight short sheath.  This was thoroughly flushed with heparinized saline.  A 5 French pigtail catheter was advanced over the guidewire into the abdominal aorta and an abdominal aortogram obtained using carbon dioxide contrast.  The infrarenal abdominal aorta is heavily calcified but patent.  The left and right common iliac arteries are heavily calcified but patent.  Next the pigtail catheter was pulled down just above the aortic bifurcation and bilateral oblique views of the pelvis were performed.  This again shows heavy calcification but the internal and external iliac arteries are patent.  A left renal transplant renal artery is visualized coming off the external iliac artery and although CO2 contrast use this is patent it is difficult to determine whether or not there is any narrowing of this.  Next the pigtail catheter was removed over a guidewire and a 5 [place] crossover catheter used to selectively catheterize the left common iliac artery and then advance an 035 angled Glidewire down into the left common femoral artery.  An 035 quick cross was then advanced over the Glidewire into the left common femoral artery and the Glidewire swapped out for an 035 versa  core wire.  I was then able to advance a 5 [place] straight catheter up and over the aortic bifurcation and down into the distal left external iliac artery.  Angiogram using carbon dioxide contrast was then performed the left lower extremity.  The left common femoral artery is heavily calcified.  The profunda is calcified but patent.  There is a flush  occlusion of the left superficial femoral artery.  There is then reconstitution of the below-knee popliteal artery with one-vessel runoff via the peroneal artery.  At this point the 5 French straight catheter was removed over guidewire and right lower extremity views were obtained through the sheath.  The right common femoral artery is heavily diseased with a 50% narrowing.  The right superficial femoral artery has a flush occlusion at the origin.  The right profunda is patent.  There are minimal collaterals in the right thigh.  The right below-knee popliteal artery does reconstitute via collaterals and there is one-vessel peroneal runoff to the right foot.  At this point the procedure was concluded.  The patient tolerated procedure well and there were no complications.  The patient was taken to the holding area in stable condition.  5 French sheath will be removed in the holding area.  Operative management: Patient overall is a very poor operative candidate.  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  She is not a candidate for percutaneous intervention because she has a flush SFA occlusion on both sides.  Her only options at this point would be continued local wound care to see if the wounds will heal.  These have not healed in several months so if eventually she decides the pain from the wounds or the wound burden is too much for her she would be a candidate for bilateral or unilateral above-knee amputation.  I discussed all of this with the patient and her  husband today.  She will follow up with me on an as-needed basis.   Laboratory Data:   Chemistry Recent Labs  Lab 05/30/20 0624 06/03/20 0407  NA 138 141  K 4.1 4.6  CL 95* 100  CO2  --  30  GLUCOSE 400* 239*  BUN 75* 61*  CREATININE 3.20* 2.92*  CALCIUM  --  10.2  GFRNONAA  --  16*  ANIONGAP  --  11    Lab Results  Component Value Date   ALT 15 06/03/2020   AST 23 06/03/2020   ALKPHOS 136 (H) 06/03/2020   BILITOT 1.1 06/03/2020   Hematology Recent Labs  Lab 05/30/20 0624 06/03/20 0407  WBC  --  14.2*  RBC  --  5.21*  HGB 15.3* 12.5  HCT 45.0 41.7  MCV  --  80.0  MCH  --  24.0*  MCHC  --  30.0  RDW  --  21.4*  PLT  --  199   Cardiac Enzymes High Sensitivity Troponin:   Recent Labs  Lab 06/03/20 0619 06/03/20 0815  TROPONINIHS 161* 151*      BNP Recent Labs  Lab 06/03/20 0619  BNP 1,883.6*    TSH:  Lab Results  Component Value Date   TSH 2.15 01/31/2020   Lipids: Lab Results  Component Value Date   CHOL 155 03/24/2018   HDL 64 03/24/2018   LDLCALC 73 03/24/2018   LDLDIRECT 89.3 06/13/2006   TRIG 91 03/24/2018   CHOLHDL 2.4 03/24/2018   HgbA1c: Lab Results  Component Value Date   HGBA1C 12.3 (H) 01/31/2020   Magnesium:  Magnesium  Date Value Ref Range Status  10/11/2019 2.2 1.7 - 2.4 mg/dL Final    Comment:    Performed at [facility], [place] [address], [place], [place] [address]   Lab Results  Component Value Date   INR 1.3 (H) 06/03/2020  INR 1.4 (H) 05/30/2020   INR 1.8 (A) 05/23/2020     Radiology/Studies:  CT ABDOMEN PELVIS WO CONTRAST  Result Date: 06/03/2020 CLINICAL DATA:  Generalized abdominal pain. Fever. Tachycardia. Renal transplant patient. Personal history of lung carcinoma. EXAM: CT ABDOMEN AND PELVIS WITHOUT CONTRAST TECHNIQUE: Multidetector CT imaging of the abdomen and pelvis was performed following the standard protocol without IV contrast. COMPARISON:  PET-CT on 11/03/2017 FINDINGS: Lower chest:  No acute findings.  Stable cardiomegaly. Hepatobiliary: No mass visualized on this unenhanced exam. Prior cholecystectomy. No evidence of biliary obstruction. Pancreas: No mass or inflammatory process visualized on this unenhanced exam. Spleen:  Within normal limits in size. Adrenals/Urinary tract: A 1.9 cm right adrenal mass is stable, consistent with benign adenoma. Native left kidney is absent. Diffuse atrophy of the native right kidney is seen which contains a 2 cm fluid attenuation cyst. Renal transplant is seen in the left iliac fossa. Several calculi are seen in the lower pole of the renal transplant, largest measuring 9 mm. No evidence of transplant hydronephrosis or perinephric fluid collections. Unremarkable unopacified urinary bladder. Stomach/Bowel: No evidence of obstruction, inflammatory process, or abnormal fluid collections. Diverticulosis is seen mainly involving the sigmoid colon, however there is no evidence of diverticulitis. Vascular/Lymphatic: No pathologically enlarged lymph nodes identified. No evidence of abdominal aortic aneurysm. Aortic and diffuse peripheral vascular atherosclerotic calcification noted. Reproductive: Prior hysterectomy noted. Adnexal regions are unremarkable in appearance. Other:  New diffuse body wall edema since prior exam. Musculoskeletal:  No suspicious bone lesions identified. IMPRESSION: New diffuse body wall edema. Left iliac fossa renal transplant, with nephrolithiasis. No evidence of transplant hydronephrosis or perinephric fluid collections. Colonic diverticulosis, without radiographic evidence of diverticulitis. Stable benign right adrenal adenoma. Aortic Atherosclerosis (ICD10-I70.0). Electronically Signed   By: Marlaine Hind M.D.   On: 06/03/2020 08:50   CT HEAD WO CONTRAST  Result Date: 06/03/2020 CLINICAL DATA:  Altered mental status.  History of lung carcinoma EXAM: CT HEAD WITHOUT CONTRAST TECHNIQUE: Contiguous axial images were obtained from the base  of the skull through the vertex without intravenous contrast. COMPARISON:  Head CT June 19, 2008 and brain MRI June 20, 2008 FINDINGS: Brain: The ventricles and sulci are within normal limits for age. There is no appreciable intracranial mass, hemorrhage, extra-axial fluid collection, or midline shift. There is evidence of a prior infarct at the left posterior temporal-superior anterior occipital-anterior inferior left parietal junction. Elsewhere there is minimal small vessel disease in the centra semiovale bilaterally. No acute infarct is appreciable. Vascular: No hyperdense vessel. There is calcification in each carotid siphon region. Skull: Bony calvarium appears intact. 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 Other visualized paranasal sinuses are clear. Orbits appear symmetric bilaterally. Other: Mastoid air cells are clear. IMPRESSION: Prior infarct at the junction of the left temporal-parietal, and occipital lobes. Minimal small vessel disease elsewhere. No acute infarct appreciable. No mass or hemorrhage. Foci of arterial vascular calcification noted. Electronically Signed   By: Lowella Grip III M.D.   On: 06/03/2020 08:44   PERIPHERAL VASCULAR CATHETERIZATION  Result Date: 05/30/2020 Procedure: Abdominal aortogram with bilateral lower extremity runoff using carbon dioxide contrast Preoperative diagnosis: Nonhealing wounds bilateral lower extremities Postoperative diagnosis: Same Anesthesia: Local Operative findings: 1.  Bilateral flush superficial femoral artery occlusions 2.  One-vessel very diseased calcified peroneal runoff bilaterally Operative details: After obtaining informed consent the patient was taken to the [place] lab.  The patient was placed  in supine position angio table.  Both groins were prepped and draped in usual sterile fashion.  Ultrasound was used to identify the right common femoral artery and femoral  bifurcation.  There was heavy calcification of the vessel under ultrasound.  After several attempts using an introducer needle to get in the right common femoral artery I was able to finally successfully cannulate this with a micropuncture needle and the micropuncture wire advanced into the right iliac system under fluoroscopy.  Micropuncture sheath was then advanced over the guidewire.  Guidewire was removed and the wire swapped out for an 035 versa core wire which was advanced in the abdominal aorta.  Micropuncture sheath was then removed over the wire and swapped out for a 5 [place] straight short sheath.  This was thoroughly flushed with heparinized saline.  A 5 French pigtail catheter was advanced over the guidewire into the abdominal aorta and an abdominal aortogram obtained using carbon dioxide contrast.  The infrarenal abdominal aorta is heavily calcified but patent.  The left and right common iliac arteries are heavily calcified but patent.  Next the pigtail catheter was pulled down just above the aortic bifurcation and bilateral oblique views of the pelvis were performed.  This again shows heavy calcification but the internal and external iliac arteries are patent.  A left renal transplant renal artery is visualized coming off the external iliac artery and although CO2 contrast use this is patent it is difficult to determine whether or not there is any narrowing of this.  Next the pigtail catheter was removed over a guidewire and a 5 [place] crossover catheter used to selectively catheterize the left common iliac artery and then advance an 035 angled Glidewire down into the left common femoral artery.  An 035 quick cross was then advanced over the Glidewire into the left common femoral artery and the Glidewire swapped out for an 035 versa core wire.  I was then able to advance a 5 [place] straight catheter up and over the aortic bifurcation and down into the distal left external iliac artery.  Angiogram using  carbon dioxide contrast was then performed the left lower extremity.  The left common femoral artery is heavily calcified.  The profunda is calcified but patent.  There is a flush occlusion of the left superficial femoral artery.  There is then reconstitution of the below-knee popliteal artery with one-vessel runoff via the peroneal artery. At this point the 5 French straight catheter was removed over guidewire and right lower extremity views were obtained through the sheath.  The right common femoral artery is heavily diseased with a 50% narrowing.  The right superficial femoral artery has a flush occlusion at the origin.  The right profunda is patent.  There are minimal collaterals in the right thigh.  The right below-knee popliteal artery does reconstitute via collaterals and there is one-vessel peroneal runoff to the right foot. At this point the procedure was concluded.  The patient tolerated procedure well and there were no complications.  The patient was taken to the holding area in stable condition.  5 French sheath will be removed in the holding area. Operative management: Patient overall is a very poor operative candidate.  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  She is not a candidate for percutaneous intervention because she has a flush SFA occlusion on both sides.  Her only options at this point would be continued local wound  care to see if the wounds will heal.  These have not healed in several months so if eventually she decides the pain from the wounds or the wound burden is too much for her she would be a candidate for bilateral or unilateral above-knee amputation.  I discussed all of this with the patient and her husband today.  She will follow up with me on an as-needed basis. Ruta Hinds, MD [facility] Office: [phone]  DG Chest Port 1 View  Result  Date: 06/03/2020 CLINICAL DATA:  Suspected sepsis EXAM: PORTABLE CHEST 1 VIEW COMPARISON:  10/01/2019 FINDINGS: Cardiomegaly. Aortic valve replacement and left atrial clipping. Congested appearance of central vessels with diffuse interstitial opacity and cephalized blood flow. No visible effusion or pneumothorax IMPRESSION: CHF pattern. Electronically Signed   By: Monte Fantasia M.D.   On: 06/03/2020 04:55    Assessment and Plan:   1. Atrial fib, RVR -Duration unknown -Prior to admission she was on metoprolol 12.5 mg twice daily. -Her Coumadin was held for the PV procedure, and she is not yet therapeutic, she has been started on heparin. -Her heart rate has improved with Cardizem at 12.5 mg/h, BP tolerates -Continue IV Cardizem for now  2. Acute on chronic diastolic CHF -Prior to admission she was on Demadex 100 mg/day -She got 1 dose of Lasix 80 mg IV in the ER, she has good urine output from this. -She was also getting lactated Ringer's 150 mL/h, but this has been stopped. -Discuss with MD continuing the Lasix 80 mg IV twice daily  3.  CKD IV -Her creatinine has been greater than 2.5 for most of the last 6 months. -Follow creatinine closely with diuresis, may need Nephrology input  4.  Sepsis -She got a dose of cefepime and vancomycin in the ER.  She also got Zosyn. 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   SignedRosaria Ferries, PA-C  06/03/2020 9:45 AM

## 2020-06-03 NOTE — Progress Notes (Signed)
  Echocardiogram 2D Echocardiogram has been performed.  [name] 06/03/2020, 12:44 PM

## 2020-06-03 NOTE — Progress Notes (Signed)
A consult was received from an ED physician for vancomycin and cefepime per pharmacy dosing.  The patient's profile has been reviewed for ht/wt/allergies/indication/available labs.   A one time order has been placed for vancomycin 1gm and cefepime 2gm    Further antibiotics/pharmacy consults should be ordered by admitting physician if indicated.                       Thank you, Dolly Rias RPh 06/03/2020, 4:38 AM

## 2020-06-03 NOTE — Progress Notes (Signed)
VANESSIA, BOKHARI ([id number]) Visit Report for 05/29/2020 Arrival Information Details Patient Name: Date of Service: [name] 05/29/2020 9:15 A M Medical Record Number: [id number] Patient Account Number: [id number] Date of Birth/Sex: Treating RN: Aug 26, 1945 (74 y.o. Debby Bud Primary Care Astryd Pearcy: [name] Other Clinician: Referring Leiliana Foody: Treating Saphyre Cillo/Extender: [name] in Treatment: 12 Visit Information History Since Last Visit Added or deleted any medications: No Patient Arrived: Wheel Chair Any new allergies or adverse reactions: No Arrival Time: 09:36 Had a fall or experienced change in No Accompanied By: self activities of daily living that may affect Transfer Assistance: None risk of falls: Patient Identification Verified: Yes Signs or symptoms of abuse/neglect since last visito No Secondary Verification Process Completed: Yes Hospitalized since last visit: No Patient Has Alerts: Yes Implantable device outside of the clinic excluding No Patient Alerts: Patient on Blood Thinner cellular tissue based products placed in the center unable to obtain ABI on R since last visit: Has Dressing in Place as Prescribed: Yes Pain Present Now: Yes Electronic Signature(s) Signed: 05/29/2020 1:06:40 PM By: Sandre Kitty Entered By: Sandre Kitty on 05/29/2020 09:37:23 -------------------------------------------------------------------------------- Lower Extremity Assessment Details Patient Name: Date of Service: [name], DO RIS 05/29/2020 9:15 A M Medical Record Number: [id number] Patient Account Number: [id number] Date of Birth/Sex: Treating RN: 04-06-1946 (74 y.o. [name] Primary Care Bronwen Pendergraft: [name] Other Clinician: Referring Jailon Schaible: Treating Aldin Drees/Extender: [name] in Treatment: 12 Edema Assessment Assessed: Shirlyn Goltz: No] Patrice Paradise: No] Edema:  [Left: Yes] [Right: Yes] Calf Left: Right: Point of Measurement: 38 cm From Medial Instep 33.5 cm 33 cm Ankle Left: Right: Point of Measurement: 9 cm From Medial Instep 22.4 cm 22 cm Electronic Signature(s) Signed: 06/02/2020 5:51:53 PM By: Levan Hurst RN, BSN Entered By: Levan Hurst on 05/29/2020 09:49:44 -------------------------------------------------------------------------------- Multi Wound Chart Details Patient Name: Date of Service: [name], DO RIS 05/29/2020 9:15 A M Medical Record Number: [id number] Patient Account Number: [id number] Date of Birth/Sex: Treating RN: 1945-09-09 (74 y.o. Helene Shoe, Meta.Reding Primary Care Yuriko Portales: [name] Other Clinician: Referring Tammala Weider: Treating Nilo Fallin/Extender: [name] in Treatment: 12 Vital Signs Height(in): 60 Pulse(bpm): 63 Weight(lbs): 160 Blood Pressure(mmHg): 139/79 Body Mass Index(BMI): 31 Temperature(F): 98.3 Respiratory Rate(breaths/min): 20 Photos: [1:No Photos Right, Posterior Lower Leg] [2:No Photos Left, Anterior Lower Leg] [3:No Photos Left, Proximal, Anterior Lower Leg] Wound Location: [1:Gradually Appeared] [2:Gradually Appeared] [3:Gradually Appeared] Wounding Event: [1:Diabetic Wound/Ulcer of the Lower] [2:Diabetic Wound/Ulcer of the Lower] [3:Diabetic Wound/Ulcer of the Lower] Primary Etiology: [1:Extremity Chronic Obstructive Pulmonary] [2:Extremity Chronic Obstructive Pulmonary] [3:Extremity Chronic Obstructive Pulmonary] Comorbid History: [1:Disease (COPD), Hypertension, Myocardial Infarction, Type II Diabetes Myocardial Infarction, Type II Diabetes Myocardial Infarction, Type II Diabetes 01/17/2020] [2:Disease (COPD), Hypertension, 01/17/2020] [3:Disease (COPD),  Hypertension, 04/07/2020] Date Acquired: [1:12] [2:12] [3:6] Weeks of Treatment: [1:Open] [2:Open] [3:Open] Wound Status: [1:No] [2:No] [3:Yes] Clustered Wound: [1:N/A] [2:N/A] [3:1] Clustered  Quantity: [1:0.7x0.8x0.3] [2:1.7x2.1x0.3] [3:4.1x2.5x0.4] Measurements L x W x D (cm) [1:0.44] [2:2.804] [3:8.05] A (cm) : rea [1:0.132] [2:0.841] [3:3.22] Volume (cm) : [1:-124.50%] [2:-224.50%] [3:-28.10%] % Reduction in A [1:rea: -238.50%] [2:-877.90%] [3:-412.70%] % Reduction in Volume: [1:Grade 2] [2:Grade 2] [3:Grade 2] Classification: [1:Small] [2:Medium] [3:Medium] Exudate A mount: [1:Serous] [2:Serous] [3:Serous] Exudate Type: [1:amber] [2:amber] [3:amber] Exudate Color: [1:Distinct, outline attached] [2:Distinct, outline attached] [3:Distinct, outline attached] Wound Margin: [1:Small (1-33%)] [2:Small (1-33%)] [3:Small (1-33%)] Granulation A mount: [1:Pink] [2:Pink] [3:Pink] Granulation Quality: [1:Large (67-100%)] [2:Large (  67-100%)] [3:Large (67-100%)] Necrotic A mount: [1:Fat Layer (Subcutaneous Tissue): Yes Fat Layer (Subcutaneous Tissue): Yes Fat Layer (Subcutaneous Tissue): Yes] Exposed Structures: [1:Fascia: No Tendon: No Muscle: No Joint: No Bone: No None] [2:Fascia: No Tendon: No Muscle: No Joint: No Bone: No None] [3:Fascia: No Tendon: No Muscle: No Joint: No Bone: No None] Epithelialization: [1:Chemical/Enzymatic/Mechanical] [2:Chemical/Enzymatic/Mechanical] [3:Chemical/Enzymatic/Mechanical] Debridement: Pre-procedure Verification/Time Out 10:10 [2:10:10] [3:10:10] Taken: [1:N/A] [2:N/A] [3:N/A] Instrument: [1:None] [2:None] [3:None] Bleeding: [1:Procedure was tolerated well] [2:Procedure was tolerated well] [3:Procedure was tolerated well] Debridement Treatment Response: [1:0.7x0.8x0.3] [2:1.7x2.1x0.3] [3:4.1x2.5x0.4] Post Debridement Measurements L x W x D (cm) [1:0.132] [2:0.841] [3:3.22] Post Debridement Volume: (cm) [1:Debridement] [2:Debridement] [3:Debridement] Wound Number: 4 N/A N/A Photos: No Photos N/A N/A Left, Posterior Lower Leg N/A N/A Wound Location: Gradually Appeared N/A N/A Wounding Event: Diabetic Wound/Ulcer of the Lower N/A N/A Primary  Etiology: Extremity Chronic Obstructive Pulmonary N/A N/A Comorbid History: Disease (COPD), Hypertension, Myocardial Infarction, Type II Diabetes 04/07/2020 N/A N/A Date Acquired: 6 N/A N/A Weeks of Treatment: Open N/A N/A Wound Status: No N/A N/A Clustered Wound: N/A N/A N/A Clustered Quantity: 0.4x0.4x0.2 N/A N/A Measurements L x W x D (cm) 0.126 N/A N/A A (cm) : rea 0.025 N/A N/A Volume (cm) : 35.70% N/A N/A % Reduction in A rea: -25.00% N/A N/A % Reduction in Volume: Grade 2 N/A N/A Classification: Small N/A N/A Exudate A mount: Serous N/A N/A Exudate Type: amber N/A N/A Exudate Color: Distinct, outline attached N/A N/A Wound Margin: Small (1-33%) N/A N/A Granulation A mount: Pink N/A N/A Granulation Quality: Large (67-100%) N/A N/A Necrotic A mount: Fat Layer (Subcutaneous Tissue): Yes N/A N/A Exposed Structures: Fascia: No Tendon: No Muscle: No Joint: No Bone: No None N/A N/A Epithelialization: Chemical/Enzymatic/Mechanical N/A N/A Debridement: Pre-procedure Verification/Time Out 10:10 N/A N/A Taken: N/A N/A N/A Instrument: None N/A N/A Bleeding: Procedure was tolerated well N/A N/A Debridement Treatment Response: 0.4x0.4x0.2 N/A N/A Post Debridement Measurements L x W x D (cm) 0.025 N/A N/A Post Debridement Volume: (cm) Debridement N/A N/A Procedures Performed: Treatment Notes Electronic Signature(s) Signed: 05/29/2020 5:00:15 PM By: Linton Ham MD Signed: 05/29/2020 5:09:44 PM By: Deon Pilling Entered By: Linton Ham on 05/29/2020 10:18:33 -------------------------------------------------------------------------------- Multi-Disciplinary Care Plan Details Patient Name: Date of Service: [name], DO RIS 05/29/2020 9:15 A M Medical Record Number: [id number] Patient Account Number: [id number] Date of Birth/Sex: Treating RN: 1946-06-10 (74 y.o. Helene Shoe, Meta.Reding Primary Care Fara Worthy: [name] Other  Clinician: Referring Rashid Whitenight: Treating Merlon Alcorta/Extender: [name] in Treatment: 12 Active Inactive Wound/Skin Impairment Nursing Diagnoses: Impaired tissue integrity Knowledge deficit related to ulceration/compromised skin integrity Goals: Patient/caregiver will verbalize understanding of skin care regimen Date Initiated: 04/14/2020 Target Resolution Date: 07/18/2020 Goal Status: Active Interventions: Assess patient/caregiver ability to obtain necessary supplies Assess patient/caregiver ability to perform ulcer/skin care regimen upon admission and as needed Assess ulceration(s) every visit Provide education on ulcer and skin care Notes: Electronic Signature(s) Signed: 05/29/2020 5:09:44 PM By: Deon Pilling Entered By: Deon Pilling on 05/29/2020 10:08:26 -------------------------------------------------------------------------------- Pain Assessment Details Patient Name: Date of Service: [name], DO RIS 05/29/2020 9:15 A M Medical Record Number: [id number] Patient Account Number: [id number] Date of Birth/Sex: Treating RN: 01-24-1946 (74 y.o. Debby Bud Primary Care Carolena Fairbank: [name] Other Clinician: Referring Chamia Schmutz: Treating Jaycey Gens/Extender: [name] in Treatment: 12 Active Problems Location of Pain Severity and Description of Pain Patient Has Paino Yes Site Locations Rate the pain. Current Pain Level: 5  Pain Management and Medication Current Pain Management: Electronic Signature(s) Signed: 05/29/2020 1:06:40 PM By: Sandre Kitty Signed: 05/29/2020 5:09:44 PM By: Deon Pilling Entered By: Sandre Kitty on 05/29/2020 09:39:14 -------------------------------------------------------------------------------- Patient/Caregiver Education Details Patient Name: Date of Service: [name], DO RIS 11/11/2021andnbsp9:15 [place] Record Number: [id number] Patient Account  Number: [id number] Date of Birth/Gender: Treating RN: 04/23/1946 (74 y.o. Helene Shoe, Meta.Reding Primary Care Physician: [name] Other Clinician: Referring Physician: Treating Physician/Extender: [name] in Treatment: 12 Education Assessment Education Provided To: Patient Education Topics Provided Tissue Oxygenation: Handouts: Peripheral Arterial Disease and Related Ulcers Methods: Explain/Verbal Responses: Reinforcements needed Wound/Skin Impairment: Handouts: Skin Care Do's and Dont's Methods: Explain/Verbal Responses: Reinforcements needed Electronic Signature(s) Signed: 05/29/2020 5:09:44 PM By: Deon Pilling Entered By: Deon Pilling on 05/29/2020 10:13:28 -------------------------------------------------------------------------------- Wound Assessment Details Patient Name: Date of Service: [name], DO RIS 05/29/2020 9:15 A M Medical Record Number: [id number] Patient Account Number: [id number] Date of Birth/Sex: Treating RN: Nov 21, 1945 (74 y.o. Helene Shoe, Meta.Reding Primary Care Lacoya Wilbanks: [name] Other Clinician: Referring Gibson Lad: Treating Tesa Meadors/Extender: [name] in Treatment: 12 Wound Status Wound Number: 1 Primary Diabetic Wound/Ulcer of the Lower Extremity Etiology: Wound Location: Right, Posterior Lower Leg Wound Open Wounding Event: Gradually Appeared Status: Date Acquired: 01/17/2020 Comorbid Chronic Obstructive Pulmonary Disease (COPD), Hypertension, Weeks Of Treatment: 12 History: Myocardial Infarction, Type II Diabetes Clustered Wound: No Photos Photo Uploaded By: Mikeal Hawthorne on 06/02/2020 10:57:50 Wound Measurements Length: (cm) 0.7 Width: (cm) 0.8 Depth: (cm) 0.3 Area: (cm) 0.44 Volume: (cm) 0.132 % Reduction in Area: -124.5% % Reduction in Volume: -238.5% Epithelialization: None Tunneling: No Undermining: No Wound Description Classification:  Grade 2 Wound Margin: Distinct, outline attached Exudate Amount: Small Exudate Type: Serous Exudate Color: amber Wound Bed Granulation Amount: Small (1-33%) Granulation Quality: Pink Necrotic Amount: Large (67-100%) Necrotic Quality: Adherent Slough Foul Odor After Cleansing: No Slough/Fibrino Yes Exposed Structure Fascia Exposed: No Fat Layer (Subcutaneous Tissue) Exposed: Yes Tendon Exposed: No Muscle Exposed: No Joint Exposed: No Bone Exposed: No Electronic Signature(s) Signed: 05/29/2020 5:09:44 PM By: Deon Pilling Signed: 06/02/2020 5:51:53 PM By: Levan Hurst RN, BSN Entered By: Levan Hurst on 05/29/2020 09:51:08 -------------------------------------------------------------------------------- Wound Assessment Details Patient Name: Date of Service: [name], DO RIS 05/29/2020 9:15 A M Medical Record Number: [id number] Patient Account Number: [id number] Date of Birth/Sex: Treating RN: 09-29-1945 (74 y.o. Helene Shoe, Meta.Reding Primary Care Kiyan Burmester: [name] Other Clinician: Referring Savino Whisenant: Treating Gerritt Galentine/Extender: [name] in Treatment: 12 Wound Status Wound Number: 2 Primary Diabetic Wound/Ulcer of the Lower Extremity Etiology: Wound Location: Left, Anterior Lower Leg Wound Open Wounding Event: Gradually Appeared Status: Date Acquired: 01/17/2020 Comorbid Chronic Obstructive Pulmonary Disease (COPD), Hypertension, Weeks Of Treatment: 12 History: Myocardial Infarction, Type II Diabetes Clustered Wound: No Photos Photo Uploaded By: Mikeal Hawthorne on 06/02/2020 10:58:28 Wound Measurements Length: (cm) 1.7 Width: (cm) 2.1 Depth: (cm) 0.3 Area: (cm) 2.804 Volume: (cm) 0.841 % Reduction in Area: -224.5% % Reduction in Volume: -877.9% Epithelialization: None Tunneling: No Undermining: No Wound Description Classification: Grade 2 Wound Margin: Distinct, outline attached Exudate Amount: Medium Exudate  Type: Serous Exudate Color: amber Foul Odor After Cleansing: No Slough/Fibrino Yes Wound Bed Granulation Amount: Small (1-33%) Exposed Structure Granulation Quality: Pink Fascia Exposed: No Necrotic Amount: Large (67-100%) Fat Layer (Subcutaneous Tissue) Exposed: Yes Necrotic Quality: Adherent Slough Tendon Exposed: No Muscle Exposed: No Joint Exposed: No Bone Exposed: No Electronic Signature(s) Signed:  05/29/2020 5:09:44 PM By: Deon Pilling Signed: 06/02/2020 5:51:53 PM By: Levan Hurst RN, BSN Entered By: Levan Hurst on 05/29/2020 09:52:00 -------------------------------------------------------------------------------- Wound Assessment Details Patient Name: Date of Service: [name], DO RIS 05/29/2020 9:15 A M Medical Record Number: [id number] Patient Account Number: [id number] Date of Birth/Sex: Treating RN: 1946-04-06 (74 y.o. Helene Shoe, Meta.Reding Primary Care Clint Biello: [name] Other Clinician: Referring Hendry Speas: Treating Jalah Warmuth/Extender: [name] in Treatment: 12 Wound Status Wound Number: 3 Primary Diabetic Wound/Ulcer of the Lower Extremity Etiology: Wound Location: Left, Proximal, Anterior Lower Leg Wound Open Wounding Event: Gradually Appeared Status: Date Acquired: 04/07/2020 Comorbid Chronic Obstructive Pulmonary Disease (COPD), Hypertension, Weeks Of Treatment: 6 History: Myocardial Infarction, Type II Diabetes Clustered Wound: Yes Photos Photo Uploaded By: Mikeal Hawthorne on 06/02/2020 10:58:12 Wound Measurements Length: (cm) 4.1 Width: (cm) 2.5 Depth: (cm) 0.4 Clustered Quantity: 1 Area: (cm) 8.05 Volume: (cm) 3.22 % Reduction in Area: -28.1% % Reduction in Volume: -412.7% Epithelialization: None Tunneling: No Undermining: No Wound Description Classification: Grade 2 Wound Margin: Distinct, outline attached Exudate Amount: Medium Exudate Type: Serous Exudate Color: amber Foul Odor After  Cleansing: No Slough/Fibrino Yes Wound Bed Granulation Amount: Small (1-33%) Exposed Structure Granulation Quality: Pink Fascia Exposed: No Necrotic Amount: Large (67-100%) Fat Layer (Subcutaneous Tissue) Exposed: Yes Necrotic Quality: Adherent Slough Tendon Exposed: No Muscle Exposed: No Joint Exposed: No Bone Exposed: No Electronic Signature(s) Signed: 05/29/2020 5:09:44 PM By: Deon Pilling Signed: 06/02/2020 5:51:53 PM By: Levan Hurst RN, BSN Entered By: Levan Hurst on 05/29/2020 09:52:37 -------------------------------------------------------------------------------- Wound Assessment Details Patient Name: Date of Service: [name], DO RIS 05/29/2020 9:15 A M Medical Record Number: [id number] Patient Account Number: [id number] Date of Birth/Sex: Treating RN: Dec 14, 1945 (74 y.o. Helene Shoe, Meta.Reding Primary Care Isaura Schiller: [name] Other Clinician: Referring Chitara Clonch: Treating Al Bracewell/Extender: [name] in Treatment: 12 Wound Status Wound Number: 4 Primary Diabetic Wound/Ulcer of the Lower Extremity Etiology: Wound Location: Left, Posterior Lower Leg Wound Open Wounding Event: Gradually Appeared Status: Date Acquired: 04/07/2020 Comorbid Chronic Obstructive Pulmonary Disease (COPD), Hypertension, Weeks Of Treatment: 6 History: Myocardial Infarction, Type II Diabetes Clustered Wound: No Photos Photo Uploaded By: Mikeal Hawthorne on 06/02/2020 10:58:13 Wound Measurements Length: (cm) 0.4 Width: (cm) 0.4 Depth: (cm) 0.2 Area: (cm) 0.126 Volume: (cm) 0.025 % Reduction in Area: 35.7% % Reduction in Volume: -25% Epithelialization: None Tunneling: No Undermining: No Wound Description Classification: Grade 2 Wound Margin: Distinct, outline attached Exudate Amount: Small Exudate Type: Serous Exudate Color: amber Foul Odor After Cleansing: No Slough/Fibrino Yes Wound Bed Granulation Amount: Small (1-33%) Exposed  Structure Granulation Quality: Pink Fascia Exposed: No Necrotic Amount: Large (67-100%) Fat Layer (Subcutaneous Tissue) Exposed: Yes Necrotic Quality: Adherent Slough Tendon Exposed: No Muscle Exposed: No Joint Exposed: No Bone Exposed: No Electronic Signature(s) Signed: 05/29/2020 5:09:44 PM By: Deon Pilling Signed: 06/02/2020 5:51:53 PM By: Levan Hurst RN, BSN Entered By: Levan Hurst on 05/29/2020 09:52:59 -------------------------------------------------------------------------------- Vitals Details Patient Name: Date of Service: [name], DO RIS 05/29/2020 9:15 A M Medical Record Number: [id number] Patient Account Number: [id number] Date of Birth/Sex: Treating RN: 11-22-1945 (74 y.o. Helene Shoe, Meta.Reding Primary Care Rishita Petron: [name] Other Clinician: Referring Antrell Tipler: Treating Fredonia Casalino/Extender: [name] in Treatment: 12 Vital Signs Time Taken: 09:38 Temperature (F): 98.3 Height (in): 60 Pulse (bpm): 81 Weight (lbs): 160 Respiratory Rate (breaths/min): 20 Body Mass Index (BMI): 31.2 Blood Pressure (mmHg): 139/79 Reference Range: 80 - 120  mg / dl Electronic Signature(s) Signed: 05/29/2020 1:06:40 PM By: Sandre Kitty Entered By: Sandre Kitty on 05/29/2020 09:39:03

## 2020-06-03 NOTE — H&P (Addendum)
History and Physical        Hospital Admission Note Date: 06/03/2020  Patient name: [name] Medical record number: [id number] Date of birth: 12-Sep-1945 Age: 74 y.o. Gender: female  PCP: Plotnikov, [name], MD  Patient coming from: home   Chief Complaint    Chief Complaint  Patient presents with  . 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 Apparently per EMS, the patient originally had called out for a nosebleed but upon arrival the patient was found to be febrile, tachypneic and tachycardic with difficulty clearing secretions.   Apparently, after her procedure 4 days ago, she was not felt to be a candidate for percutaneous intervention because of flush SFA occlusion on both sides and only would be a candidate for local wound unless they did not heal then she may need amputations.   Currently she is awake and initially seemed confused due to her difficulty speaking.  Husband was unable to be contacted by phone to question baseline.  Stat head CT was ordered.  After further discussion with the patient she admitted she has chronic difficulties with speech  ED Course: Febrile, tachycardic, tachypneic,  hypoxic (SpO2 84%) placed on 2 LPM. Notable Labs: Glucose 239, BUN 61, creatinine 2.92, calcium 10.2, alk phos 136,  albumin 2.8, lactic acid 1.7, WBC 14, Hb 12.5, INR 1.3, BNP 1883 (chronically elevated at about baseline), troponin 161.  VBG: pH 7.39, PCO2 54, PO2 26.8, bicarb 32.  Noted to be in atrial fibrillation with RVR.  Notable Imaging: CXR concerning for CHF pattern.  Started on sepsis protocol and initial LR boluses were discontinued prior to giving patient.  She was given LR maintenance fluid and received Lasix 80 mg IV x1, started on Cardizem drip, Tylenol, vancomycin and Zosyn.   Vitals:   06/03/20 0900 06/03/20 0915  BP: (!) 146/81 140/70  Pulse: (!) 101 (!) 101  Resp: 15 (!) 24  Temp:    SpO2: 96% 97%     Review of Systems:  Review of Systems  All other systems reviewed and are negative.   Medical/Social/Family History   Past Medical History: Past Medical History:  Diagnosis Date  . Acute CHF (congestive heart failure) ([place]) 12/2018  . Adenocarcinoma of lung, stage 1, right ([place]) 11/25/2017  . Anemia, iron deficiency    of chronic disease  . Aortic stenosis    a. Severe AS by echo 11/2012.  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 Asystole ([place])    a. During ENT surgery 2005: developed marked asystole requiring CPR, felt due to vagal reaction (cath nonobst dz).  . Carotid artery disease ([place])    a.  Carotid Dopplers performed in August 2013 showed 40-59% left stenosis and 0-39% right; f/u recommended in 2 years.   . Cerebrovascular accident [facility]) 2009   a. LMCA infarct felt embolic 3790, maintained on chronic coumadin.; denies residual on 04/05/2013  . Cholelithiasis   . Chronic Persistent Atrial Fibrillation 12/31/2008   Qualifier: Diagnosis of  By: [name]    . Coronary artery disease 05/2002   a. Ant MI 2003 s/p PTCA/stent to RCA.   . Diverticulosis of colon   . Esophagitis, reflux   . ESRD (end stage renal disease) ([place])    a. Mass on L kidney per pt s/p nephrectomy - pt states not cancer - [facility] notes indicate ESRD due to HTN/DM - was previously on  HD. b. Kidney transplant 02/2011.  Marland Kitchen GERD (gastroesophageal reflux disease)   . Gout   . Hearing loss   . Helicobacter pylori (H. pylori) infection    hx of  . Hemorrhoids   . Hx of colonic polyps    adenomatous  . Hyperlipidemia   . Hypertension   . Lung nodule seen on imaging study 04/07/2013   1.0 cm ground glass opacity RUL  . Myocardial infarction ([place]) 2003  . Pericardial effusion    a. Small by echo 11/2011.  . S/P aortic valve replacement with bioprosthetic valve and maze procedure 04/12/2013   [facility]   . S/P Maze operation for atrial fibrillation 04/12/2013   Complete bilateral atrial lesion set using bipolar radiofrequency and cryothermy ablation with clipping of LA appendage  . Sleep apnea    Pt says testing was positive, intolerant of CPAP.  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  . [place]   . Type II diabetes mellitus ([place]     Past Surgical History:  Procedure Laterality Date  . ABDOMINAL AORTOGRAM W/LOWER EXTREMITY Bilateral 05/30/2020   Procedure: ABDOMINAL AORTOGRAM W/LOWER EXTREMITY;  Surgeon: FElam Dutch MD;  [facility];  Service: Cardiovascular;  Laterality: Bilateral;  . AORTIC VALVE REPLACEMENT N/A 04/12/2013   Procedure: AORTIC VALVE REPLACEMENT (AVR);  Surgeon: CRexene Alberts MD;  [place] [facility]  Service: Open Heart Surgery;  Laterality: N/A;  . ARTERIOVENOUS GRAFT PLACEMENT Left   . ARTERIOVENOUS GRAFT PLACEMENT Left    "I've had 2 on my left; had one removed" (04/05/2013)   . ARTERY EXPLORATION Right 04/11/2013   Procedure: ARTERY EXPLORATION;  Surgeon: CRexene Alberts MD;  [facility]  Service: Open Heart Surgery;  Laterality: Right;  Right carotid artery exploration  . AV FISTULA PLACEMENT Right   . AV  FISTULA REPAIR Right    "took it out" ((/18/2014)  . CARDIOVERSION  05/29/2012   Procedure: CARDIOVERSION;  Surgeon: BLelon Perla MD;  [facility];  Service: Cardiovascular;  Laterality: N/A;  . CHOLECYSTECTOMY  2009   with hernia removal  . CORONARY ANGIOPLASTY WITH STENT PLACEMENT Right    coronary artery  . INSERTION OF DIALYSIS CATHETER Bilateral    "over the years; took them both out" (04/05/2013)  . INTRAOPERATIVE TRANSESOPHAGEAL ECHOCARDIOGRAM N/A 04/11/2013   Procedure: INTRAOPERATIVE TRANSESOPHAGEAL ECHOCARDIOGRAM;  Surgeon: CRexene Alberts MD;  [place] [facility]  Service: Open Heart Surgery;  Laterality: N/A;  . INTRAOPERATIVE TRANSESOPHAGEAL ECHOCARDIOGRAM N/A 04/12/2013   Procedure: INTRAOPERATIVE TRANSESOPHAGEAL ECHOCARDIOGRAM;  Surgeon: CValentina Gu  Roxy Manns, MD;  Location: [place];  Service: Open Heart Surgery;  Laterality: N/A;  . KIDNEY TRANSPLANT  03/16/11  . LEFT AND RIGHT HEART CATHETERIZATION WITH CORONARY ANGIOGRAM N/A 04/06/2013   Procedure: LEFT AND RIGHT HEART CATHETERIZATION WITH CORONARY ANGIOGRAM;  Surgeon: Blane Ohara, MD;  Location: [facility] CATH LAB;  Service: Cardiovascular;  Laterality: N/A;  . MAZE N/A 04/12/2013   Procedure: MAZE;  Surgeon: Rexene Alberts, MD;  Location: [place];  Service: Open Heart Surgery;  Laterality: N/A;  . NASAL RECONSTRUCTION WITH SEPTAL REPAIR     "took it out" (04/05/2013)  . NEPHRECTOMY Left 2010   no CA on bx  . TONSILLECTOMY    . TOTAL ABDOMINAL HYSTERECTOMY    . TUBAL LIGATION      Medications: Prior to Admission medications   Medication Sig Start Date End Date Taking? Authorizing Provider  acetaminophen (TYLENOL) 500 MG tablet Take 500 mg by mouth every 6 (six) hours as needed for mild pain or headache.    [provider]  amoxicillin (AMOXIL) 500 MG capsule TAKE 4 CAPSULES BY MOUTH 1 HOUR PRIOR TO DENTAL WORK Patient taking differently: Take 2,000 mg by mouth once. 1 HOUR PRIOR TO DENTAL WORK 03/05/20    Plotnikov, [name], MD  clobetasol ointment (TEMOVATE) 9.37 % Apply 1 application topically 2 (two) times daily. Patient not taking: Reported on 05/26/2020 01/31/20   Janith Lima, MD  dapsone 25 MG tablet Take 25 mg by mouth daily.     [provider]  gabapentin (NEURONTIN) 300 MG capsule Take 1 capsule (300 mg total) by mouth 3 (three) times daily. 10/22/19   Plotnikov, [name], MD  insulin glargine, 1 Unit Dial, (TOUJEO SOLOSTAR) 300 UNIT/ML Solostar Pen Inject 60 Units into the skin every morning. Titrate up by 1 unit a day if needed for goal sugars of 100-130 up to 50 units a day max 02/01/20   Janith Lima, MD  insulin lispro (HUMALOG KWIKPEN) 200 UNIT/ML KwikPen Inject 10 Units into the skin with breakfast, with lunch, and with evening meal. 02/01/20   Janith Lima, MD  Insulin Pen Needle (B-D UF III MINI PEN NEEDLES) 31G X 5 MM MISC USE TO ADMINISTER INSULIN FOUR TIMES A DAY DX E11.9 11/11/17   Plotnikov, [name], MD  levothyroxine (SYNTHROID) 25 MCG tablet TAKE 2 TABLETS (50 MCG TOTAL) BY MOUTH DAILY. 01/25/20   Plotnikov, [name], MD  lidocaine (XYLOCAINE) 5 % ointment Apply 1 application topically 4 (four) times daily as needed. Patient taking differently: Apply 1 application topically daily as needed (to numb sores).  02/27/20   Plotnikov, [name], MD  metoprolol tartrate (LOPRESSOR) 25 MG tablet Take 0.5 tablets (12.5 mg total) by mouth 2 (two) times daily. 08/22/19   Sherran Needs, NP  [facility] DELICA LANCETS 90W MISC Use to check blood sugars three times a day DX E11.9 10/12/17   Plotnikov, [name], MD  ONETOUCH VERIO test strip USE TO TEST 3 TIMES DAILY. DX E11.9 11/27/19   Plotnikov, [name], MD  oxyCODONE-acetaminophen (PERCOCET/ROXICET) 5-325 MG tablet Take 1 tablet by mouth every 8 (eight) hours as needed for severe pain. 04/22/20   Plotnikov, [name], MD  OXYGEN Inhale 3 L into the lungs daily as needed (for shortness of breath).     [provider]   potassium chloride SA (KLOR-CON M20) 20 MEQ tablet Take 1 tablet (20 mEq total) by mouth daily. 09/19/19 05/26/20  Deberah Pelton,  NP  predniSONE (DELTASONE) 5 MG tablet Take 5 mg by mouth daily.  03/29/19 08/19/20  [provider]  Propylene Glycol (SYSTANE BALANCE) 0.6 % SOLN Place 1-2 drops into both eyes in the morning, at noon, and at bedtime.     [provider]  rosuvastatin (CRESTOR) 20 MG tablet Take 1 tablet (20 mg total) by mouth daily. 03/21/19   Lelon Perla, MD  SANTYL ointment Apply 1 application topically daily. 05/22/20   [provider]  tacrolimus (PROGRAF) 1 MG capsule Take 3 mg by mouth 2 (two) times daily.     [provider]  torsemide (DEMADEX) 100 MG tablet Take 2 tablets (200 mg total) by mouth daily. 12/31/19   Plotnikov, [name], MD  triamcinolone cream (KENALOG) 0.1 % Apply 1 application topically 4 (four) times daily. Patient not taking: Reported on 05/26/2020 12/05/19   Plotnikov, [name], MD  warfarin (COUMADIN) 5 MG tablet TAKE 1/2 TO 1 TABLET BY MOUTH DAILY AS DIRECTED BY COUMADIN CLINIC Patient taking differently: Take 2.5-5 mg by mouth See admin instructions. Take 2.5 mg every day EXCEPT: Thursday take 5 mg at bedtime 11/16/19   Lelon Perla, MD    Allergies:   Allergies  Allergen Reactions  . Ibuprofen Nausea And Vomiting  . Sulfamethoxazole-Trimethoprim Itching, Swelling and Rash    Swelling of the face  . Sulfonamide Derivatives Itching, Swelling and Rash    Swelling of the face  . Tape Rash    Paper tape is ok  . Tramadol Nausea And Vomiting  . Doxycycline Nausea Only  . Hydrocil [Psyllium] Nausea And Vomiting  . Bactrim Itching, Swelling and Rash  . Red Dye Itching and Rash    Social History:  reports that she quit smoking about 18 years ago. Her smoking use included cigarettes. She has a 30.00 pack-year smoking history. She has never used smokeless tobacco. She reports that she does not drink alcohol  and does not use drugs.  Family History: Family History  Problem Relation Age of Onset  . Stroke Father   . Hypertension Mother   . Diabetes Other   . Diabetes Maternal Grandmother   . Diabetes Son   . Crohn's disease Other        grandson   . Breast cancer Neg Hx   . Stomach cancer Neg Hx   . Esophageal cancer Neg Hx   . Colon cancer Neg Hx   . Pancreatic cancer Neg Hx      Objective   Physical Exam: Blood pressure 140/70, pulse (!) 101, temperature (!) 101.8 F (38.8 C), temperature source Rectal, resp. rate (!) 24, weight 82 kg, SpO2 97 %.  Physical Exam Vitals and nursing note reviewed.  Constitutional:      General: She is not in acute distress.    Comments: Awake, alert  HENT:     Head: Normocephalic.     Mouth/Throat:     Mouth: Mucous membranes are moist.  Eyes:     Extraocular Movements: Extraocular movements intact.     Conjunctiva/sclera: Conjunctivae normal.  Cardiovascular:     Rate and Rhythm: Tachycardia present. Rhythm irregular.     Heart sounds: Murmur heard.   Pulmonary:     Effort: No respiratory distress.     Breath sounds: Rales present. No wheezing.  Abdominal:     General: There is no distension.     Tenderness: There is abdominal tenderness.  Musculoskeletal:        General:  Swelling and tenderness present.     Comments: Purulent bilateral lower extremity wounds with surrounding erythema and warmth of the LLE wounds  Skin:    Coloration: Skin is not jaundiced.     Findings: Erythema present.  Neurological:     Mental Status: She is alert and oriented to person, place, and time.     Comments: Expressive aphasia and garbled speech  Psychiatric:        Mood and Affect: Mood normal.       LABS on Admission: I have personally reviewed all the labs and imaging below    Basic Metabolic Panel: Recent Labs  Lab 05/30/20 0624 06/03/20 0407  NA 138 141  K 4.1 4.6  CL 95* 100  CO2  --  30  GLUCOSE 400* 239*  BUN 75* 61*    CREATININE 3.20* 2.92*  CALCIUM  --  10.2   Liver Function Tests: Recent Labs  Lab 06/03/20 0407  AST 23  ALT 15  ALKPHOS 136*  BILITOT 1.1  PROT 7.2  ALBUMIN 2.8*   No results for input(s): LIPASE, AMYLASE in the last 168 hours. No results for input(s): AMMONIA in the last 168 hours. CBC: Recent Labs  Lab 05/30/20 0624 06/03/20 0407  WBC  --  14.2*  NEUTROABS  --  12.3*  HGB 15.3* 12.5  HCT 45.0 41.7  MCV  --  80.0  PLT  --  199   Cardiac Enzymes: No results for input(s): CKTOTAL, CKMB, CKMBINDEX, TROPONINI in the last 168 hours. BNP: Invalid input(s): POCBNP CBG: Recent Labs  Lab 05/30/20 1703 05/30/20 1749  GLUCAP 87 92    Radiological Exams on Admission:  CT ABDOMEN PELVIS WO CONTRAST  Result Date: 06/03/2020 CLINICAL DATA:  Generalized abdominal pain. Fever. Tachycardia. Renal transplant patient. Personal history of lung carcinoma. EXAM: CT ABDOMEN AND PELVIS WITHOUT CONTRAST TECHNIQUE: Multidetector CT imaging of the abdomen and pelvis was performed following the standard protocol without IV contrast. COMPARISON:  PET-CT on 11/03/2017 FINDINGS: Lower chest: No acute findings.  Stable cardiomegaly. Hepatobiliary: No mass visualized on this unenhanced exam. Prior cholecystectomy. No evidence of biliary obstruction. Pancreas: No mass or inflammatory process visualized on this unenhanced exam. Spleen:  Within normal limits in size. Adrenals/Urinary tract: A 1.9 cm right adrenal mass is stable, consistent with benign adenoma. Native left kidney is absent. Diffuse atrophy of the native right kidney is seen which contains a 2 cm fluid attenuation cyst. Renal transplant is seen in the left iliac fossa. Several calculi are seen in the lower pole of the renal transplant, largest measuring 9 mm. No evidence of transplant hydronephrosis or perinephric fluid collections. Unremarkable unopacified urinary bladder. Stomach/Bowel: No evidence of obstruction, inflammatory process,  or abnormal fluid collections. Diverticulosis is seen mainly involving the sigmoid colon, however there is no evidence of diverticulitis. Vascular/Lymphatic: No pathologically enlarged lymph nodes identified. No evidence of abdominal aortic aneurysm. Aortic and diffuse peripheral vascular atherosclerotic calcification noted. Reproductive: Prior hysterectomy noted. Adnexal regions are unremarkable in appearance. Other:  New diffuse body wall edema since prior exam. Musculoskeletal:  No suspicious bone lesions identified. IMPRESSION: New diffuse body wall edema. Left iliac fossa renal transplant, with nephrolithiasis. No evidence of transplant hydronephrosis or perinephric fluid collections. Colonic diverticulosis, without radiographic evidence of diverticulitis. Stable benign right adrenal adenoma. Aortic Atherosclerosis (ICD10-I70.0). Electronically Signed   By: Marlaine Hind M.D.   On: 06/03/2020 08:50   CT HEAD WO CONTRAST  Result Date: 06/03/2020 CLINICAL DATA:  Altered mental  status.  History of lung carcinoma EXAM: CT HEAD WITHOUT CONTRAST TECHNIQUE: Contiguous axial images were obtained from the base of the skull through the vertex without intravenous contrast. COMPARISON:  Head CT June 19, 2008 and brain MRI June 20, 2008 FINDINGS: Brain: The ventricles and sulci are within normal limits for age. There is no appreciable intracranial mass, hemorrhage, extra-axial fluid collection, or midline shift. There is evidence of a prior infarct at the left posterior temporal-superior anterior occipital-anterior inferior left parietal junction. Elsewhere there is minimal small vessel disease in the centra semiovale bilaterally. No acute infarct is appreciable. Vascular: No hyperdense vessel. There is calcification in each carotid siphon region. Skull: Bony calvarium appears intact. 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 Other visualized paranasal  sinuses are clear. Orbits appear symmetric bilaterally. Other: Mastoid air cells are clear. IMPRESSION: Prior infarct at the junction of the left temporal-parietal, and occipital lobes. Minimal small vessel disease elsewhere. No acute infarct appreciable. No mass or hemorrhage. Foci of arterial vascular calcification noted. Electronically Signed   By: Lowella Grip III M.D.   On: 06/03/2020 08:44   DG Chest Port 1 View  Result Date: 06/03/2020 CLINICAL DATA:  Suspected sepsis EXAM: PORTABLE CHEST 1 VIEW COMPARISON:  10/01/2019 FINDINGS: Cardiomegaly. Aortic valve replacement and left atrial clipping. Congested appearance of central vessels with diffuse interstitial opacity and cephalized blood flow. No visible effusion or pneumothorax IMPRESSION: CHF pattern. Electronically Signed   By: Monte Fantasia M.D.   On: 06/03/2020 04:55      EKG: Atrial fibrillation with RVR   A & P   Principal Problem:   Sepsis ([place]) Active Problems:   Type 2 diabetes mellitus with chronic kidney disease, with long-term current use of insulin (HCC)   Hyperlipidemia LDL goal <70   Renal transplant recipient   S/P aortic valve replacement with bioprosthetic valve and maze procedure   Acute on chronic diastolic CHF (congestive heart failure) (HCC)   History of stroke   Venous stasis ulcers of both lower extremities (HCC)   ESRD (end stage renal disease) (HCC)   Atrial fibrillation with RVR ([place])   1. Sepsis without septic shock, unknown source but suspected from LLE purulent cellulitis and concern for possible endocarditis a. Febrile, tachycardic, tachypneic, leukocytosis b. Chronic bilateral lower extremity wounds, left is with purulence and surrounding cellulitis c. Has a history of aortic stenosis s/p AVR with a heart failure exacerbation -> echo for vegetations d. Follow-up cultures e. Holding IV fluids due to heart failure f. Change antibiotics to vancomycin and meropenem per pharmacy for now  pending cultures g. Palliative care consulted  2. Atrial fibrillation with RVR with History of MAZE procedure a. On Coumadin and beta-blocker outpatient b. INR 1.3 today c. Currently on Cardizem drip started in the ED, will likely need to change in the setting of heart failure d. Heparin drip e. Cardiology consulted, appreciate assistance  3. Acute on chronic heart failure exacerbation, suspect diastolic a. Echo 1/51/7616: EF 60 to 65% b. Received Lasix 80 mg IV x1 c. Likely exacerbated by A. fib and possible endocarditis d. Cardiology consulted, appreciate assistance  4. Acute on chronic hypoxemic respiratory failure likely from CHF a. Initially on room air then SpO2 dropped to 84%, currently on 2 LPM b. Has intermittent O2 requirement at baseline c. pO2 27 on VBG d. Diuresis per cardio/nephro  5. Abdominal pain in the setting of recent abdominal aortogram for  chronic wounds a. CT abdomen pelvis without contrast ordered - new body wall edema otherwise no acute findings  6. ESRD s/p renal transplant, now CKD IV on immunosuppression a. Nephrology consulted, appreciate assistance  7. Severe aortic stenosis s/p AVR a. On coumadin, INR 1.3 b. Cardiology consulted, appreciate assistance  8. Hypertension a. Currently on cardizem drip b. Holding PO meds for now  9. Hyperlipidemia a. Currently NPO, holding meds  10. CAD s/p stent a. On coumadin and statin outpatient - currently NPO  11. Embolic CVA with residual expressive aphasia a. Subtherapeutic INR -> CT head -> prior infarct at the junction of the left temporal parietal and occipital lobes, no acute infarct b. SLP eval  12. Diabetes a. Lantus 20 u daily with sliding scale  13. Chronic bilateral lower extremity wounds a. WOCN    DVT prophylaxis: heparin   Code Status: Full Code  Diet: NPO pending SLP Family Communication: Admission, patients condition and plan of care including tests being ordered have been  discussed with the patient who indicates understanding and agrees with the plan and Code Status. Patient's husband was called but no response  Disposition Plan: The appropriate patient status for this patient is INPATIENT. Inpatient status is judged to be reasonable and necessary in order to provide the required intensity of service to ensure the patient's safety. The patient's presenting symptoms, physical exam findings, and initial radiographic and laboratory data in the context of their chronic comorbidities is felt to place them at high risk for further clinical deterioration. Furthermore, it is not anticipated that the patient will be medically stable for discharge from the hospital within 2 midnights of admission. The following factors support the patient status of inpatient.   " The patient's presenting symptoms include fever. " The worrisome physical exam findings include left lower extremity wound with surrounding cellulitis. " The initial radiographic and laboratory data are worrisome because of hypoxemia, leukocytosis. " The chronic co-morbidities include CKD, CHF, hypertension, chronic wounds, embolic CVA, CAD.   * I certify that at the point of admission it is my clinical judgment that the patient will require inpatient hospital care spanning beyond 2 midnights from the point of admission due to high intensity of service, high risk for further deterioration and high frequency of surveillance required.*   Status is: Inpatient  Remains inpatient appropriate because:Ongoing diagnostic testing needed not appropriate for outpatient work up, IV treatments appropriate due to intensity of illness or inability to take PO and Inpatient level of care appropriate due to severity of illness   Dispo: The patient is from: Home              Anticipated d/c is to: TBD              Anticipated d/c date is: > 3 days              Patient currently is not medically stable to d/c.         The  medical decision making on this patient was of high complexity and the patient is at high risk for clinical deterioration, therefore this is a level 3  admission.  Consultants  . Nephrology . Cardiology . Palliative  Procedures  . None  Time Spent on Admission: 100 minutes    Harold Hedge, DO [facility]  06/03/2020, 9:19 AM

## 2020-06-03 NOTE — Progress Notes (Signed)
[place] for heparin Indication: hx bioprosthetic AVR, stroke, afib  Allergies  Allergen Reactions  . Ibuprofen Nausea And Vomiting  . Sulfamethoxazole-Trimethoprim Itching, Swelling and Rash    Swelling of the face  . Sulfonamide Derivatives Itching, Swelling and Rash    Swelling of the face  . Tape Rash    Paper tape is ok  . Tramadol Nausea And Vomiting  . Doxycycline Nausea Only  . Hydrocil [Psyllium] Nausea And Vomiting  . Bactrim Itching, Swelling and Rash  . Red Dye Itching and Rash    Patient Measurements: Weight: 82 kg (180 lb 12.4 oz) Heparin Dosing Weight: 82 kg  Vital Signs: Temp: 98.7 F (37.1 C) (11/16 1600) Temp Source: Oral (11/16 1600) BP: 167/54 (11/16 1413) Pulse Rate: 97 (11/16 1308)  Labs: Recent Labs    06/03/20 0407 06/03/20 0619 06/03/20 0815 06/03/20 1008 06/03/20 1009 06/03/20 1730  HGB 12.5  --   --   --   --   --   HCT 41.7  --   --   --   --   --   PLT 199  --   --   --   --   --   APTT 32  --   --  32  --   --   LABPROT 16.1*  --   --   --   --   --   INR 1.3*  --   --   --   --   --   HEPARINUNFRC  --   --   --   --   --  0.23*  CREATININE 2.92*  --   --   --   --   --   TROPONINIHS  --  161* 151*  --  174*  --     Estimated Creatinine Clearance: 15.6 mL/min (A) (by C-G formula based on SCr of 2.92 mg/dL (H)).   Medications:  - on warfarin PTA  Assessment: Patient is a 74 y.o F with hx bioprosthetic AVR, stroke, and afib on warfarin PTA presented to the ED on 11/16 with fever. Pharmacy is consulted to transition to heparin drip on admission.  Today, 06/03/2020: - INR 1.3 - cbc ok - scr 2.92 (crcl~15) First heparin level drawn ~ 7.5 hours after 4000 unit bolus and drip at 1000 units/hr = 0.23 - subtherapeutic. No infusion problems or bleeding reported  Goal of Therapy:  Heparin level 0.3-0.7 units/ml Monitor platelets by anticoagulation protocol: Yes   Plan:  - re bolus with  heparin 1500 units IV x1, and increase drip to 1200 units/hr - check 8 hr heparin level Daily heparin level & CBC - monitor for s/s bleeding  Eudelia Bunch, Pharm.D 06/03/2020 6:36 PM

## 2020-06-03 NOTE — Evaluation (Signed)
Clinical/Bedside Swallow Evaluation Patient Details  Name: [name] MRN: [id number] Date of Birth: October 01, 1945  Today's Date: 06/03/2020 Time: SLP Start Time (ACUTE ONLY): 1055 SLP Stop Time (ACUTE ONLY): 1120 SLP Time Calculation (min) (ACUTE ONLY): 25 min  Past Medical History:  Past Medical History:  Diagnosis Date  . Acute CHF (congestive heart failure) ([place]) 12/2018  . Adenocarcinoma of lung, stage 1, right ([place]) 11/25/2017  . Anemia, iron deficiency    of chronic disease  . Aortic stenosis    a. Severe AS by echo 11/2012.  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 Asystole ([place])    a. During ENT surgery 2005: developed marked asystole requiring CPR, felt due to vagal reaction (cath nonobst dz).  . Carotid artery disease ([place])    a. Carotid Dopplers performed in August 2013 showed 40-59% left stenosis and 0-39% right; f/u recommended in 2 years.   . Cerebrovascular accident [facility]) 2009   a. LMCA infarct felt embolic 2993, maintained on chronic coumadin.; denies residual on 04/05/2013  . Cholelithiasis   . Chronic Persistent Atrial Fibrillation 12/31/2008   Qualifier: Diagnosis of  By: [name]    . Coronary artery disease 05/2002   a. Ant MI 2003 s/p PTCA/stent to RCA.   . Diverticulosis of colon   . Esophagitis, reflux   . ESRD (end stage renal disease) ([place])    a. Mass on L kidney per pt s/p nephrectomy - pt states not cancer - [facility] notes indicate ESRD due to HTN/DM - was previously on HD. b. Kidney transplant 02/2011.  Marland Kitchen GERD (gastroesophageal reflux disease)   . Gout   . Hearing loss   . Helicobacter pylori (H. pylori) infection    hx of  . Hemorrhoids   . Hx of colonic polyps    adenomatous  . Hyperlipidemia   . Hypertension   . Lung nodule seen on imaging study 04/07/2013   1.0 cm ground glass opacity RUL  . Myocardial infarction ([place]) 2003  . Pericardial effusion    a. Small by echo 11/2011.  . S/P aortic valve replacement with  bioprosthetic valve and maze procedure 04/12/2013   64mm [facility] Ease bovine pericardial tissue valve   . S/P Maze operation for atrial fibrillation 04/12/2013   Complete bilateral atrial lesion set using bipolar radiofrequency and cryothermy ablation with clipping of LA appendage  . Sleep apnea    Pt says testing was positive, intolerant of CPAP.  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  . Stroke ([place])   . Type II diabetes mellitus ([place])    Past Surgical History:  Past Surgical History:  Procedure Laterality Date  . ABDOMINAL AORTOGRAM W/LOWER EXTREMITY Bilateral 05/30/2020   Procedure: ABDOMINAL AORTOGRAM W/LOWER EXTREMITY;  Surgeon: Elam Dutch, MD;  Location: [place] CV LAB;  Service: Cardiovascular;  Laterality: Bilateral;  . AORTIC VALVE REPLACEMENT N/A 04/12/2013   Procedure: AORTIC VALVE REPLACEMENT (AVR);  Surgeon: Rexene Alberts, MD;  Location: [place];  Service: Open Heart Surgery;  Laterality: N/A;  . ARTERIOVENOUS GRAFT PLACEMENT Left   . ARTERIOVENOUS GRAFT PLACEMENT Left    "I've had 2 on my left; had one removed" (04/05/2013)   . ARTERY EXPLORATION Right 04/11/2013   Procedure: ARTERY EXPLORATION;  Surgeon: Rexene Alberts, MD;  Location: [place];  Service: Open Heart Surgery;  Laterality: Right;  Right carotid artery exploration  . AV FISTULA PLACEMENT Right   . AV FISTULA REPAIR Right    "took it out" ((/18/2014)  . CARDIOVERSION  05/29/2012   Procedure: CARDIOVERSION;  Surgeon: Lelon Perla, MD;  Location[facility] ENDOSCOPY;  Service: Cardiovascular;  Laterality: N/A;  . CHOLECYSTECTOMY  2009   with hernia removal  . CORONARY ANGIOPLASTY WITH STENT PLACEMENT Right    coronary artery  . INSERTION OF DIALYSIS CATHETER Bilateral    "over the years; took them both out" (04/05/2013)  . INTRAOPERATIVE  TRANSESOPHAGEAL ECHOCARDIOGRAM N/A 04/11/2013   Procedure: INTRAOPERATIVE TRANSESOPHAGEAL ECHOCARDIOGRAM;  Surgeon: Rexene Alberts, MD;  Location: [place];  Service: Open Heart Surgery;  Laterality: N/A;  . INTRAOPERATIVE TRANSESOPHAGEAL ECHOCARDIOGRAM N/A 04/12/2013   Procedure: INTRAOPERATIVE TRANSESOPHAGEAL ECHOCARDIOGRAM;  Surgeon: Rexene Alberts, MD;  Location: [place];  Service: Open Heart Surgery;  Laterality: N/A;  . KIDNEY TRANSPLANT  03/16/11  . LEFT AND RIGHT HEART CATHETERIZATION WITH CORONARY ANGIOGRAM N/A 04/06/2013   Procedure: LEFT AND RIGHT HEART CATHETERIZATION WITH CORONARY ANGIOGRAM;  Surgeon: Blane Ohara, MD;  Location: [facility] CATH LAB;  Service: Cardiovascular;  Laterality: N/A;  . MAZE N/A 04/12/2013   Procedure: MAZE;  Surgeon: Rexene Alberts, MD;  Location: [place];  Service: Open Heart Surgery;  Laterality: N/A;  . NASAL RECONSTRUCTION WITH SEPTAL REPAIR     "took it out" (04/05/2013)  . NEPHRECTOMY Left 2010   no CA on bx  . TONSILLECTOMY    . TOTAL ABDOMINAL HYSTERECTOMY    . TUBAL LIGATION     HPI:  74yo female admitted 06/03/20 with fever. PMH: CAD s/p stent, severe AS s/p AVR, HTN, HLD, OSA, COPD, HFpEF, AFib s/p maze, DM2, ESRD s/p renal transplant, CKD4, lung cancer, embolic CVA with residual aphasia (2009), BLE nonhealing wounds, esophagitis/reflux, GERD, gout, hearing loss   Assessment / Plan / Recommendation Clinical Impression  Pt was seen at bedside in ED for assessment of swallow function and safety. CN exam is unremarkable. Pt reports no difficulty with PO intake. Pt accepted trials of thin liquid, puree, crackers and ham sandwich. No obvious oral issues, and no overt s/s aspiration observed on any texture. Pt was noted to clear her throat occasionally, which she reports is a chronic occurance, likely due to reflux. Will begin regular diet and thin liquids. RN and MD informed. No further ST intervention recommended at this time. Please reconsult if needs arise.    SLP Visit Diagnosis: Dysphagia, unspecified (R13.10)    Aspiration Risk  Mild aspiration risk    Diet Recommendation Regular;Thin liquid   Liquid Administration via: Cup;Straw Medication Administration: Whole meds with liquid Supervision: Patient able to self feed Compensations: Slow rate;Small sips/bites;Minimize environmental distractions Postural Changes: Seated upright at 90 degrees;Remain upright for at least 30 minutes after po intake    Other  Recommendations Oral Care Recommendations: Oral care BID   Follow up Recommendations None          Prognosis Prognosis for Safe Diet Advancement: Good      Swallow Study   General Date of Onset: 06/03/20 HPI: 74yo female admitted 06/03/20 with fever. PMH: CAD s/p stent, severe AS s/p AVR, HTN, HLD, OSA, COPD, HFpEF, AFib s/p maze, DM2, ESRD s/p renal transplant, CKD4, lung cancer, embolic CVA with residual aphasia (2009), BLE nonhealing wounds, esophagitis/reflux, GERD, gout, hearing loss Type of Study: Bedside Swallow Evaluation Previous Swallow Assessment: MBS 03/2013 - reg/thin Diet Prior to this Study: NPO  Temperature Spikes Noted: No Respiratory Status: Nasal cannula History of Recent Intubation: No Behavior/Cognition: Alert;Cooperative;Pleasant mood Oral Cavity - Dentition: Adequate natural dentition Vision: Functional for self-feeding Self-Feeding Abilities: Able to feed self Patient Positioning: Upright in bed Baseline Vocal Quality: Normal Volitional Cough: Strong Volitional Swallow: Able to elicit    Oral/Motor/Sensory Function Overall Oral Motor/Sensory Function: Within functional limits      Thin Liquid Thin Liquid: Within functional limits Presentation: Straw    Puree Puree: Within functional limits Presentation: Self Fed;Spoon   Solid     Solid: Within functional limits Presentation: [place] B. Quentin Ore, [facility], [place] Speech Language Pathologist Office: [phone] Pager:  [phone]  Shonna Chock 06/03/2020,11:51 AM

## 2020-06-03 NOTE — ED Notes (Signed)
Critical Troponin 174 relayed to Denton Ar, RN via ED Radio.

## 2020-06-03 NOTE — Consult Note (Signed)
WOC Nurse Consult Note: Reason for Consult: Nonhealing venous wounds to bilateral lower legs.  2 on left lower leg and one on right.  Seen at wound care center regularly.  Wound type:venous chronic nonhealing Pressure Injury POA: NA Measurement:Left proximal:  2 cm x 2.5 cm  Left distal:  1 cm x 0.5 cm cm  Right posterior 1.5 cm x 1 cm  Wound bed: 50% slough 50% rudy red Drainage (amount, consistency, odor) minimal serosanguinous  No odor Periwound:generalized edema  Dressing procedure/placement/frequency: Cleanse wounds to bilateral lower legs with NS and pat gently dry. Apply santyl to wound bed.  Cover with NS moist gauze and top with dry gauze and kerlix/tape. Change daily.  Will not follow at this time.  Please re-consult if needed.  Domenic Moras MSN, RN, FNP-BC CWON Wound, Ostomy, Continence Nurse Pager [phone]

## 2020-06-03 NOTE — Progress Notes (Signed)
[place] for heparin Indication: hx bioprosthetic AVR, stroke, afib  Allergies  Allergen Reactions  . Ibuprofen Nausea And Vomiting  . Sulfamethoxazole-Trimethoprim Itching, Swelling and Rash    Swelling of the face  . Sulfonamide Derivatives Itching, Swelling and Rash    Swelling of the face  . Tape Rash    Paper tape is ok  . Tramadol Nausea And Vomiting  . Doxycycline Nausea Only  . Hydrocil [Psyllium] Nausea And Vomiting  . Bactrim Itching, Swelling and Rash  . Red Dye Itching and Rash    Patient Measurements: Weight: 82 kg (180 lb 12.4 oz) Heparin Dosing Weight: 82 kg  Vital Signs: Temp: 101.8 F (38.8 C) (11/16 0445) Temp Source: Rectal (11/16 0445) BP: 146/81 (11/16 0900) Pulse Rate: 101 (11/16 0900)  Labs: Recent Labs    06/03/20 0407 06/03/20 0619  HGB 12.5  --   HCT 41.7  --   PLT 199  --   APTT 32  --   LABPROT 16.1*  --   INR 1.3*  --   CREATININE 2.92*  --   TROPONINIHS  --  161*    Estimated Creatinine Clearance: 15.6 mL/min (A) (by C-G formula based on SCr of 2.92 mg/dL (H)).   Medications:  - on warfarin PTA  Assessment: Patient is a 74 y.o F with hx bioprosthetic AVR, stroke, and afib on warfarin PTA presented to the ED on 11/16 with fever. Pharmacy is consulted to transition to heparin drip on admission.  Today, 06/03/2020: - INR 1.3 - cbc ok - scr 2.92 (crcl~15)  Goal of Therapy:  Heparin level 0.3-0.7 units/ml Monitor platelets by anticoagulation protocol: Yes   Plan:  - heparin 4000 units IV x1 bolus, then 1000 units/hr - check 8 hr heparin level - monitor for s/s bleeding  Aqua Denslow P 06/03/2020,9:12 AM

## 2020-06-03 NOTE — ED Triage Notes (Signed)
Pt to ER via EMS from home.  Per EMS, they were originally called out for a nosebleed.  Upon arrival, pt was found to be febrile and tachycardic with several open wound to bilateral LE.  Pt also tachypneic upon arrival and having difficulty clearing secretions.

## 2020-06-03 NOTE — Progress Notes (Signed)
[place] following Sepsis protocol

## 2020-06-04 ENCOUNTER — Other Ambulatory Visit: Payer: Self-pay

## 2020-06-04 DIAGNOSIS — A419 Sepsis, unspecified organism: Secondary | ICD-10-CM | POA: Diagnosis not present

## 2020-06-04 DIAGNOSIS — N186 End stage renal disease: Secondary | ICD-10-CM | POA: Diagnosis not present

## 2020-06-04 DIAGNOSIS — I272 Pulmonary hypertension, unspecified: Secondary | ICD-10-CM

## 2020-06-04 DIAGNOSIS — I5033 Acute on chronic diastolic (congestive) heart failure: Secondary | ICD-10-CM | POA: Diagnosis not present

## 2020-06-04 DIAGNOSIS — Z515 Encounter for palliative care: Secondary | ICD-10-CM

## 2020-06-04 DIAGNOSIS — I4891 Unspecified atrial fibrillation: Secondary | ICD-10-CM | POA: Diagnosis not present

## 2020-06-04 DIAGNOSIS — L03115 Cellulitis of right lower limb: Secondary | ICD-10-CM | POA: Diagnosis not present

## 2020-06-04 LAB — CBC
HCT: 39.5 % (ref 36.0–46.0)
Hemoglobin: 12 g/dL (ref 12.0–15.0)
MCH: 24.2 pg — ABNORMAL LOW (ref 26.0–34.0)
MCHC: 30.4 g/dL (ref 30.0–36.0)
MCV: 79.6 fL — ABNORMAL LOW (ref 80.0–100.0)
Platelets: 168 10*3/uL (ref 150–400)
RBC: 4.96 MIL/uL (ref 3.87–5.11)
RDW: 21 % — ABNORMAL HIGH (ref 11.5–15.5)
WBC: 16 10*3/uL — ABNORMAL HIGH (ref 4.0–10.5)
nRBC: 0 % (ref 0.0–0.2)

## 2020-06-04 LAB — BASIC METABOLIC PANEL
Anion gap: 9 (ref 5–15)
BUN: 52 mg/dL — ABNORMAL HIGH (ref 8–23)
CO2: 28 mmol/L (ref 22–32)
Calcium: 9.7 mg/dL (ref 8.9–10.3)
Chloride: 101 mmol/L (ref 98–111)
Creatinine, Ser: 2.58 mg/dL — ABNORMAL HIGH (ref 0.44–1.00)
GFR, Estimated: 19 mL/min — ABNORMAL LOW (ref >60)
Glucose, Bld: 113 mg/dL — ABNORMAL HIGH (ref 70–99)
Potassium: 3.8 mmol/L (ref 3.5–5.1)
Sodium: 138 mmol/L (ref 135–145)

## 2020-06-04 LAB — PROTIME-INR
INR: 1.4 — ABNORMAL HIGH (ref 0.8–1.2)
Prothrombin Time: 16.6 seconds — ABNORMAL HIGH (ref 11.4–15.2)

## 2020-06-04 LAB — GLUCOSE, CAPILLARY
Glucose-Capillary: 312 mg/dL — ABNORMAL HIGH (ref 70–99)
Glucose-Capillary: 91 mg/dL (ref 70–99)
Glucose-Capillary: 70 mg/dL (ref 70–99)
Glucose-Capillary: 49 mg/dL — ABNORMAL LOW (ref 70–99)
Glucose-Capillary: 54 mg/dL — ABNORMAL LOW (ref 70–99)
Glucose-Capillary: 90 mg/dL (ref 70–99)

## 2020-06-04 LAB — HEPARIN LEVEL (UNFRACTIONATED)
Heparin Unfractionated: 0.26 IU/mL — ABNORMAL LOW (ref 0.30–0.70)
Heparin Unfractionated: 0.16 IU/mL — ABNORMAL LOW (ref 0.30–0.70)

## 2020-06-04 MED ORDER — HEPARIN BOLUS VIA INFUSION
2000.0000 [IU] | Freq: Once | INTRAVENOUS | Status: AC
  Administered 2020-06-04: 2000 [IU] via INTRAVENOUS
  Filled 2020-06-04: qty 2000

## 2020-06-04 MED ORDER — AMLODIPINE BESYLATE 5 MG PO TABS
5.0000 mg | ORAL_TABLET | Freq: Every day | ORAL | Status: DC
  Administered 2020-06-04 – 2020-06-08 (×5): 5 mg via ORAL
  Filled 2020-06-04 (×6): qty 1

## 2020-06-04 MED ORDER — LINEZOLID 600 MG/300ML IV SOLN
600.0000 mg | Freq: Two times a day (BID) | INTRAVENOUS | Status: DC
  Administered 2020-06-04 (×2): 600 mg via INTRAVENOUS
  Filled 2020-06-04 (×3): qty 300

## 2020-06-04 MED ORDER — SODIUM CHLORIDE 0.9 % IV SOLN
2.0000 g | INTRAVENOUS | Status: DC
  Administered 2020-06-04: 2 g via INTRAVENOUS
  Filled 2020-06-04: qty 2

## 2020-06-04 MED ORDER — COLLAGENASE 250 UNIT/GM EX OINT
TOPICAL_OINTMENT | Freq: Every day | CUTANEOUS | Status: DC
  Filled 2020-06-04 (×3): qty 30

## 2020-06-04 MED ORDER — METRONIDAZOLE 500 MG PO TABS
500.0000 mg | ORAL_TABLET | Freq: Three times a day (TID) | ORAL | Status: DC
  Administered 2020-06-04 – 2020-06-05 (×3): 500 mg via ORAL
  Filled 2020-06-04 (×4): qty 1

## 2020-06-04 NOTE — Progress Notes (Addendum)
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 Apparently per EMS, the patient originally had called out for a nosebleed but upon arrival the patient was found to be febrile, tachypneic and tachycardic with difficulty clearing secretions.   Assessment & Plan:   Principal Problem:   Sepsis ([place]) Active Problems:   Type 2 diabetes mellitus with chronic kidney disease, with long-term current use of insulin (HCC)   Hyperlipidemia LDL goal <70   Renal transplant recipient   S/P aortic valve replacement with bioprosthetic valve and maze procedure   Acute on chronic diastolic CHF (congestive heart failure) (HCC)   History of stroke   Venous stasis ulcers of both lower extremities (HCC)   ESRD (end stage renal disease) (HCC)   Atrial fibrillation with rapid ventricular response ([facility])   1. Sepsis without septic shock, unknown source but suspected from LLE purulent cellulitis and concern for possible endocarditis a. Presented febrile, tachycardic, tachypneic, leukocytosis b. Chronic bilateral lower extremity wounds, with concerns for active cellulitis c. Has a history of aortic stenosis s/p AVR with a heart failure exacerbation -> echo reviewed with no mention of vegetation d. Blood cultures neg e. Holding IV fluids due to heart failure f. Continued on empiric vancomycin and meropenem g. Palliative care consulted at time of presentation for  [place]  2. Atrial fibrillation with RVR with History of MAZE procedure a. On Coumadin and beta-blocker outpatient b. INR 1.4 today c. Was initially placed on cardizem gtt, now transitioned to metoprolol per Cardiology, rate controlled. Appreciate assistance by Cardiology d. On Heparin drip  3. Acute on chronic heart failure exacerbation, suspect diastolic a. Echo 1/75/1025: EF 60 to 65% b. Received Lasix 80 mg IV x1 c. Likely exacerbated by A. fib and possible endocarditis d. Cardiology consulted, appreciate assistance  4. Acute on chronic hypoxemic respiratory failure likely from CHF a. Initially on room air then SpO2 dropped to 84%, currently on 2 LPM b. Has intermittent O2 requirement at baseline c. pO2 27 on VBG d. Continued on 2LNC. Continue on lasix 80mg  IV BID  5. Abdominal pain in the setting of recent abdominal aortogram for chronic wounds a. CT abdomen pelvis without contrast ordered - new body wall edema otherwise no acute findings  6. ESRD s/p renal transplant, now CKD IV on immunosuppression a. Nephrology consulted, appreciate assistance  7. Severe aortic stenosis s/p AVR a. Was on coumadin prior to admit, now on heparin gtt b. INR today 1.4 c. Cardiology consulted, appreciate assistance  8. Hypertension a. Now off cardizem gtt and on PO metoprolol b. BP currently stable c. Holding PO meds for now  9. Hyperlipidemia a. Cont diet as tolerate  10. CAD s/p stent a. On coumadin and statin prior to admit b. Currently on heparin gtt  11. Embolic CVA with residual expressive aphasia a. Subtherapeutic INR -> CT head -> prior infarct at the junction of the left temporal parietal and occipital lobes, no acute infarct b. Passed SLP eval  12. Diabetes a. Pt is continued  on Lantus 20 u daily with sliding scale  13. Chronic bilateral lower extremity wounds a. WOC eval and recs noted   DVT prophylaxis: Heparin gtt Code Status: Full Family Communication:  Pt in room, family not at bedside  Status is: Inpatient  Remains inpatient appropriate because:Unsafe d/c plan and IV treatments appropriate due to intensity of illness or inability to take PO   Dispo: The patient is from: Home              Anticipated d/c is to: Pending PT/OT eval              Anticipated d/c date is: 2 days              Patient currently is not medically stable to d/c.       Consultants:   Cardiology  Procedures:     Antimicrobials: Anti-infectives (From admission, onward)   Start     Dose/Rate Route Frequency Ordered Stop   06/03/20 1800  meropenem (MERREM) 500 mg in sodium chloride 0.9 % 100 mL IVPB        500 mg 200 mL/hr over 30 Minutes Intravenous Every 12 hours 06/03/20 0907     06/03/20 0909  vancomycin variable dose per unstable renal function (pharmacist dosing)         Does not apply See admin instructions 06/03/20 0909     06/03/20 0430  ceFEPIme (MAXIPIME) 2 g in sodium chloride 0.9 % 100 mL IVPB        2 g 200 mL/hr over 30 Minutes Intravenous  Once 06/03/20 0423 06/03/20 0617   06/03/20 0430  piperacillin-tazobactam (ZOSYN) IVPB 3.375 g        3.375 g 12.5 mL/hr over 240 Minutes Intravenous  Once 06/03/20 0423 06/03/20 0909   06/03/20 0430  vancomycin (VANCOCIN) IVPB 1000 mg/200 mL premix        1,000 mg 200 mL/hr over 60 Minutes Intravenous  Once 06/03/20 0423 06/03/20 0617       Subjective: Confused this am, difficult to assess  Objective: Vitals:   06/04/20 0800 06/04/20 0852 06/04/20 0900 06/04/20 0906  BP: (!) 136/99  (!) 154/121 (!) 154/121  Pulse: 88  90   Resp: 17  (!) 29   Temp: 98.4 F (36.9 C)     TempSrc: Axillary     SpO2: 96%  96%   Weight:  85 kg    Height:  5\' 1"  (1.549 m)      Intake/Output Summary (Last 24 hours) at 06/04/2020 1128 Last data filed at 06/04/2020 0800 Gross per 24 hour  Intake 424.86 ml  Output 2550 ml  Net -2125.14 ml   Filed Weights   06/03/20 0400 06/04/20 0424 06/04/20 0852   Weight: 82 kg 82.5 kg 85 kg    Examination:  General exam: Appears calm and comfortable  Respiratory system: Clear to auscultation. Respiratory effort normal. Cardiovascular system: S1 & S2 heard, Regular Gastrointestinal system: Abdomen is nondistended, soft and nontender. No organomegaly or masses felt. Normal bowel sounds heard. Central nervous system: Alert, confused. No focal neurological deficits. Extremities: Symmetric 5 x 5 power. Skin: No rashes, lesions Psychiatry: Difficult to assess given mentation  Data Reviewed: I have personally reviewed following labs and imaging studies  CBC: Recent Labs  Lab 05/30/20 0624 06/03/20 0407 06/04/20 0423  WBC  --  14.2* 16.0*  NEUTROABS  --  12.3*  --   HGB 15.3* 12.5 12.0  HCT 45.0 41.7 39.5  MCV  --  80.0 79.6*  PLT  --  199 989   Basic Metabolic Panel: Recent Labs  Lab 05/30/20 0624 06/03/20 0407 06/04/20 0423  NA 138 141 138  K 4.1 4.6 3.8  CL 95* 100 101  CO2  --  30 28  GLUCOSE 400* 239* 113*  BUN 75* 61* 52*  CREATININE 3.20* 2.92* 2.58*  CALCIUM  --  10.2 9.7   GFR: Estimated Creatinine Clearance: 18.4 mL/min (A) (by C-G formula based on SCr of 2.58 mg/dL (H)). Liver Function Tests: Recent Labs  Lab 06/03/20 0407  AST 23  ALT 15  ALKPHOS 136*  BILITOT 1.1  PROT 7.2  ALBUMIN 2.8*   No results for input(s): LIPASE, AMYLASE in the last 168 hours. No results for input(s): AMMONIA in the last 168 hours. Coagulation Profile: Recent Labs  Lab 05/30/20 0532 06/03/20 0407 06/04/20 0423  INR 1.4* 1.3* 1.4*   Cardiac Enzymes: No results for input(s): CKTOTAL, CKMB, CKMBINDEX, TROPONINI in the last 168 hours. BNP (last 3 results) No results for input(s): PROBNP in the last 8760 hours. HbA1C: Recent Labs    06/03/20 0407  HGBA1C 9.2*   CBG: Recent Labs  Lab 06/03/20 1144 06/03/20 1401 06/03/20 1632 06/03/20 2117 06/04/20 0752  GLUCAP 201* 312* 212* 180* 91   Lipid Profile: No results  for input(s): CHOL, HDL, LDLCALC, TRIG, CHOLHDL, LDLDIRECT in the last 72 hours. Thyroid Function Tests: No results for input(s): TSH, T4TOTAL, FREET4, T3FREE, THYROIDAB in the last 72 hours. Anemia Panel: No results for input(s): VITAMINB12, FOLATE, FERRITIN, TIBC, IRON, RETICCTPCT in the last 72 hours. Sepsis Labs: Recent Labs  Lab 06/03/20 0407 06/03/20 0619  LATICACIDVEN 1.7 1.5    Recent Results (from the past 240 hour(s))  SARS CORONAVIRUS 2 (TAT 6-24 HRS) Nasopharyngeal Nasopharyngeal Swab     Status: None   Collection Time: 05/29/20 11:16 AM   Specimen: Nasopharyngeal Swab  Result Value Ref Range Status   SARS Coronavirus 2 NEGATIVE NEGATIVE Final    Comment: (NOTE) SARS-CoV-2 target nucleic acids are NOT DETECTED.  The SARS-CoV-2 RNA is generally detectable in upper and lower respiratory specimens during the acute phase of infection. Negative results do not preclude SARS-CoV-2 infection, do not rule out co-infections with other pathogens, and should not be used as the sole basis for treatment or other patient management decisions. Negative results must be combined with clinical observations, patient history, and epidemiological information. The expected result is Negative.  Fact Sheet for Patients: SugarRoll.be  Fact Sheet for Healthcare Providers: https://www.woods-mathews.com/  This test is not yet approved or cleared by the [place] FDA and  has been authorized for detection and/or diagnosis of SARS-CoV-2 by FDA under an Emergency Use Authorization (EUA). This EUA will remain  in effect (meaning this test can be used) for the duration of the COVID-19 declaration under Se ction 564(b)(1) of the Act, 21 U.S.C. section 360bbb-3(b)(1), unless the authorization is terminated or revoked sooner.  Performed at [facility], [place] [address] [place], [place] [address]   Culture, blood (Routine x 2)     Status: None  (Preliminary result)   Collection Time: 06/03/20  4:07 AM   Specimen: BLOOD  Result Value Ref Range Status   Specimen Description   Final    BLOOD RIGHT ANTECUBITAL Performed at [place] [address], [place],  [address]    Special Requests   Final    BOTTLES DRAWN AEROBIC AND ANAEROBIC Blood Culture adequate volume Performed  at [facility], [place] [address], [place], [place] [address]    Culture   Final    NO GROWTH < 12 HOURS Performed at [facility] [address], [place], [place] [address]    Report Status PENDING  Incomplete  Culture, blood (Routine x 2)     Status: None (Preliminary result)   Collection Time: 06/03/20  4:07 AM   Specimen: BLOOD RIGHT HAND  Result Value Ref Range Status   Specimen Description   Final    BLOOD RIGHT HAND Performed at [facility] [address] [place], [place] [address]    Special Requests   Final    BOTTLES DRAWN AEROBIC AND ANAEROBIC Blood Culture results may not be optimal due to an inadequate volume of blood received in culture bottles Performed at [facility] [address], [place], [place] [address]    Culture   Final    NO GROWTH < 12 HOURS Performed at [facility] [address] [place], [place] [address]    Report Status PENDING  Incomplete  Urine culture     Status: Abnormal (Preliminary result)   Collection Time: 06/03/20  4:29 AM   Specimen: In/Out Cath Urine  Result Value Ref Range Status   Specimen Description   Final    IN/OUT CATH URINE Performed at [place] [address], [place], [place] [address]    Special Requests   Final    NONE Performed at [facility], [place] [address], [place], [place] [address]    Culture (A)  Final    10,000 COLONIES/mL PROTEUS MIRABILIS SUSCEPTIBILITIES TO FOLLOW Performed at [facility], [place] [address], [place], [place]  [address]    Report Status PENDING  Incomplete  Resp Panel by RT PCR (RSV, Flu A&B, Covid) - Nasopharyngeal Swab     Status: None   Collection Time: 06/03/20  8:14 AM   Specimen: Nasopharyngeal Swab  Result Value Ref Range Status   SARS Coronavirus 2 by RT PCR NEGATIVE NEGATIVE Final    Comment: (NOTE) SARS-CoV-2 target nucleic acids are NOT DETECTED.  The SARS-CoV-2 RNA is generally detectable in upper respiratoy specimens during the acute phase of infection. The lowest concentration of SARS-CoV-2 viral copies this assay can detect is 131 copies/mL. A negative result does not preclude SARS-Cov-2 infection and should not be used as the sole basis for treatment or other patient management decisions. A negative result may occur with  improper specimen collection/handling, submission of specimen other than nasopharyngeal swab, presence of viral mutation(s) within the areas targeted by this assay, and inadequate number of viral copies (<131 copies/mL). A negative result must be combined with clinical observations, patient history, and epidemiological information. The expected result is Negative.  Fact Sheet for Patients:  PinkCheek.be  Fact Sheet for Healthcare Providers:  GravelBags.it  This test is no t yet approved or cleared by the [place] FDA and  has been authorized for detection and/or diagnosis of SARS-CoV-2 by FDA under an Emergency Use Authorization (EUA). This EUA will remain  in effect (meaning this test can be used) for the duration of the COVID-19 declaration under Section 564(b)(1) of the Act, 21 U.S.C. section 360bbb-3(b)(1), unless the authorization is terminated or revoked sooner.     Influenza A by PCR NEGATIVE NEGATIVE Final   Influenza B by PCR NEGATIVE NEGATIVE Final    Comment: (NOTE) The Xpert Xpress SARS-CoV-2/FLU/RSV assay  is intended as an aid in  the diagnosis of influenza from Nasopharyngeal  swab specimens and  should not be used as a sole basis for treatment. Nasal washings and  aspirates are unacceptable for Xpert Xpress SARS-CoV-2/FLU/RSV  testing.  Fact Sheet for Patients: PinkCheek.be  Fact Sheet for Healthcare Providers: GravelBags.it  This test is not yet approved or cleared by the [place] FDA and  has been authorized for detection and/or diagnosis of SARS-CoV-2 by  FDA under an Emergency Use Authorization (EUA). This EUA will remain  in effect (meaning this test can be used) for the duration of the  Covid-19 declaration under Section 564(b)(1) of the Act, 21  U.S.C. section 360bbb-3(b)(1), unless the authorization is  terminated or revoked.    Respiratory Syncytial Virus by PCR NEGATIVE NEGATIVE Final    Comment: (NOTE) Fact Sheet for Patients: PinkCheek.be  Fact Sheet for Healthcare Providers: GravelBags.it  This test is not yet approved or cleared by the [place] FDA and  has been authorized for detection and/or diagnosis of SARS-CoV-2 by  FDA under an Emergency Use Authorization (EUA). This EUA will remain  in effect (meaning this test can be used) for the duration of the  COVID-19 declaration under Section 564(b)(1) of the Act, 21 U.S.C.  section 360bbb-3(b)(1), unless the authorization is terminated or  revoked. Performed at [facility], [place] [address] [place], [place] [address]   MRSA PCR Screening     Status: Abnormal   Collection Time: 06/03/20  2:00 PM   Specimen: Nasal Mucosa; Nasopharyngeal  Result Value Ref Range Status   MRSA by PCR POSITIVE (A) NEGATIVE Final    Comment:        The GeneXpert MRSA Assay (FDA approved for NASAL specimens only), is one component of a comprehensive MRSA colonization surveillance program. It is not intended to diagnose MRSA infection nor to guide or monitor  treatment for MRSA infections. RESULT CALLED TO, READ BACK BY AND VERIFIED WITH: HEAVNER,L. RN @1848  06/03/20 BILLINGSLEY,L Performed at [facility], [place] [address] [place], [place] [address]      Radiology Studies: CT ABDOMEN PELVIS WO CONTRAST  Result Date: 06/03/2020 CLINICAL DATA:  Generalized abdominal pain. Fever. Tachycardia. Renal transplant patient. Personal history of lung carcinoma. EXAM: CT ABDOMEN AND PELVIS WITHOUT CONTRAST TECHNIQUE: Multidetector CT imaging of the abdomen and pelvis was performed following the standard protocol without IV contrast. COMPARISON:  PET-CT on 11/03/2017 FINDINGS: Lower chest: No acute findings.  Stable cardiomegaly. Hepatobiliary: No mass visualized on this unenhanced exam. Prior cholecystectomy. No evidence of biliary obstruction. Pancreas: No mass or inflammatory process visualized on this unenhanced exam. Spleen:  Within normal limits in size. Adrenals/Urinary tract: A 1.9 cm right adrenal mass is stable, consistent with benign adenoma. Native left kidney is absent. Diffuse atrophy of the native right kidney is seen which contains a 2 cm fluid attenuation cyst. Renal transplant is seen in the left iliac fossa. Several calculi are seen in the lower pole of the renal transplant, largest measuring 9 mm. No evidence of transplant hydronephrosis or perinephric fluid collections. Unremarkable unopacified urinary bladder. Stomach/Bowel: No evidence of obstruction, inflammatory process, or abnormal fluid collections. Diverticulosis is seen mainly involving the sigmoid colon, however there is no evidence of diverticulitis. Vascular/Lymphatic: No pathologically enlarged lymph nodes identified. No evidence of abdominal aortic aneurysm. Aortic and diffuse peripheral vascular atherosclerotic calcification noted. Reproductive: Prior hysterectomy noted. Adnexal regions are unremarkable in appearance. Other:  New diffuse body wall  edema since prior  exam. Musculoskeletal:  No suspicious bone lesions identified. IMPRESSION: New diffuse body wall edema. Left iliac fossa renal transplant, with nephrolithiasis. No evidence of transplant hydronephrosis or perinephric fluid collections. Colonic diverticulosis, without radiographic evidence of diverticulitis. Stable benign right adrenal adenoma. Aortic Atherosclerosis (ICD10-I70.0). Electronically Signed   By: Marlaine Hind M.D.   On: 06/03/2020 08:50   CT HEAD WO CONTRAST  Result Date: 06/03/2020 CLINICAL DATA:  Altered mental status.  History of lung carcinoma EXAM: CT HEAD WITHOUT CONTRAST TECHNIQUE: Contiguous axial images were obtained from the base of the skull through the vertex without intravenous contrast. COMPARISON:  Head CT June 19, 2008 and brain MRI June 20, 2008 FINDINGS: Brain: The ventricles and sulci are within normal limits for age. There is no appreciable intracranial mass, hemorrhage, extra-axial fluid collection, or midline shift. There is evidence of a prior infarct at the left posterior temporal-superior anterior occipital-anterior inferior left parietal junction. Elsewhere there is minimal small vessel disease in the centra semiovale bilaterally. No acute infarct is appreciable. Vascular: No hyperdense vessel. There is calcification in each carotid siphon region. Skull: Bony calvarium appears intact. 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 Other visualized paranasal sinuses are clear. Orbits appear symmetric bilaterally. Other: Mastoid air cells are clear. IMPRESSION: Prior infarct at the junction of the left temporal-parietal, and occipital lobes. Minimal small vessel disease elsewhere. No acute infarct appreciable. No mass or hemorrhage. Foci of arterial vascular calcification noted. Electronically Signed   By: Lowella Grip III M.D.   On: 06/03/2020 08:44   DG Chest Port 1 View  Result Date:  06/03/2020 CLINICAL DATA:  Suspected sepsis EXAM: PORTABLE CHEST 1 VIEW COMPARISON:  10/01/2019 FINDINGS: Cardiomegaly. Aortic valve replacement and left atrial clipping. Congested appearance of central vessels with diffuse interstitial opacity and cephalized blood flow. No visible effusion or pneumothorax IMPRESSION: CHF pattern. Electronically Signed   By: Monte Fantasia M.D.   On: 06/03/2020 04:55   ECHOCARDIOGRAM COMPLETE  Result Date: 06/03/2020    ECHOCARDIOGRAM REPORT   Patient Name:   EIKO MCGOWEN Date of Exam: 06/03/2020 Medical Rec #:  [id number]    Height:       60.0 in Accession #:    [id number]   Weight:       180.8 lb Date of Birth:  06-20-1946    BSA:          1.788 m Patient Age:    49 years     BP:           136/96 mmHg Patient Gender: F            HR:           96 bpm. Exam Location:  Inpatient Procedure: 2D Echo, Cardiac Doppler and Color Doppler                               MODIFIED REPORT: This report was modified by Cherlynn Kaiser MD on 06/03/2020 due to revision.  Indications:     I50.33 Acute on chronic diastolic (congestive) heart failure;                  Fever 780.6 / R50.9  History:         Patient has prior history of Echocardiogram examinations, most  recent 10/02/2019. Previous Myocardial Infarction, COPD and                  Carotid Disease, Aortic Valve Disease, Arrythmias:Atrial                  Fibrillation; Risk Factors:Hypertension, Former Smoker,                  Dyslipidemia, Diabetes and GERD. Sepsis.                  Aortic Valve: 23 mm Edwards bovine valve is present in the                  aortic position. Procedure Date: 04/07/2013.  Sonographer:     Jonelle Sidle Dance Referring Phys:  [id number] Harold Hedge Diagnosing Phys: Cherlynn Kaiser MD IMPRESSIONS  1. Left ventricular ejection fraction, by estimation, is 55 to 60%. The left ventricle has normal function. The left ventricle demonstrates regional wall motion abnormalities (see scoring  diagram/findings for description). There is mild left ventricular  hypertrophy. Left ventricular diastolic parameters are indeterminate.  2. Right ventricular systolic function is moderately reduced. The right ventricular size is moderately enlarged. There is severely elevated pulmonary artery systolic pressure. The estimated right ventricular systolic pressure is 62.9 mmHg.  3. Left atrial size was severely dilated.  4. Right atrial size was moderately dilated.  5. Suspect small flail segment on anterior mitral leaflet causing very eccentric posteriorly directed mitral valve regurgitation that appears at least moderate-severe. From short axis view may involve A3 scallop.. The mitral valve is abnormal. Moderate to severe mitral valve regurgitation. Severe mitral annular calcification.  6. Tricuspid valve regurgitation is severe.  7. The aortic valve has been repaired/replaced. Aortic valve regurgitation is not visualized. There is a 23 mm Edwards bovine valve present in the aortic position. Procedure Date: 04/07/2013. Echo findings are consistent with normal structure and function of the aortic valve prosthesis. Aortic valve mean gradient measures 18.2 mmHg.  8. The inferior vena cava is dilated in size with <50% respiratory variability, suggesting right atrial pressure of 15 mmHg. Comparison(s): A prior study was performed on 10/02/19. Prior images reviewed side by side. Mitral regurgitation may not have significantly changed, but is more evident on today's exam. Consider TEE for further evaluation of mitral valve morphology and severity of regurgitation. No significant change in aortic valve prosthesis. FINDINGS  Left Ventricle: Left ventricular ejection fraction, by estimation, is 55 to 60%. The left ventricle has normal function. The left ventricle demonstrates regional wall motion abnormalities. The left ventricular internal cavity size was normal in size. There is mild left ventricular hypertrophy. Left  ventricular diastolic parameters are indeterminate.  LV Wall Scoring: The posterior wall and basal inferior segment are hypokinetic. Right Ventricle: The right ventricular size is moderately enlarged. No increase in right ventricular wall thickness. Right ventricular systolic function is moderately reduced. There is severely elevated pulmonary artery systolic pressure. The tricuspid regurgitant velocity is 3.82 m/s, and with an assumed right atrial pressure of 15 mmHg, the estimated right ventricular systolic pressure is 52.8 mmHg. Left Atrium: Left atrial size was severely dilated. Right Atrium: Right atrial size was moderately dilated. Pericardium: There is no evidence of pericardial effusion. Mitral Valve: Suspect small flail segment on anterior mitral leaflet causing very eccentric posteriorly directed mitral valve regurgitation that appears at least moderate-severe. From short axis view may involve A3 scallop. The mitral valve is abnormal. There is moderate calcification of the mitral  valve leaflet(s). Severe mitral annular calcification. Moderate to severe mitral valve regurgitation. Tricuspid Valve: Systolic reversals in hepatic vein Doppler suggest severe TR. The tricuspid valve is normal in structure. Tricuspid valve regurgitation is severe. Aortic Valve: Grossly normal aortic valve prosthesis. Vmax 3 m/s, DVI 0.37. AT 77 msec, EOA 1.52 cm2, iEOA 0.85 cm2/m2. The aortic valve has been repaired/replaced. Aortic valve regurgitation is not visualized. Aortic valve mean gradient measures 18.2 mmHg. Aortic valve peak gradient measures 35.5 mmHg. Aortic valve area, by VTI measures 1.54 cm. There is a 23 mm Edwards bovine valve present in the aortic position. Procedure Date: 04/07/2013. Echo findings are consistent with normal structure and function of the aortic valve prosthesis. Pulmonic Valve: The pulmonic valve was grossly normal. Pulmonic valve regurgitation is mild to moderate. Aorta: The aortic root was  not well visualized. Venous: The inferior vena cava is dilated in size with less than 50% respiratory variability, suggesting right atrial pressure of 15 mmHg. IAS/Shunts: No atrial level shunt detected by color flow Doppler.  LEFT VENTRICLE PLAX 2D LVIDd:         4.90 cm LVIDs:         4.00 cm LV PW:         1.50 cm LV IVS:        1.00 cm LVOT diam:     2.30 cm LV SV:         71 LV SV Index:   40 LVOT Area:     4.15 cm  RIGHT VENTRICLE          IVC RV Basal diam:  4.40 cm  IVC diam: 2.80 cm RV Mid diam:    3.00 cm TAPSE (M-mode): 1.1 cm LEFT ATRIUM             Index       RIGHT ATRIUM           Index LA diam:        4.30 cm 2.40 cm/m  RA Area:     22.90 cm LA Vol (A2C):   92.8 ml 51.90 ml/m RA Volume:   74.80 ml  41.83 ml/m LA Vol (A4C):   76.7 ml 42.89 ml/m LA Biplane Vol: 84.0 ml 46.98 ml/m  AORTIC VALVE AV Area (Vmax):    1.25 cm AV Area (Vmean):   1.19 cm AV Area (VTI):     1.54 cm AV Vmax:           297.95 cm/s AV Vmean:          202.608 cm/s AV VTI:            0.465 m AV Peak Grad:      35.5 mmHg AV Mean Grad:      18.2 mmHg LVOT Vmax:         89.55 cm/s LVOT Vmean:        58.100 cm/s LVOT VTI:          0.172 m LVOT/AV VTI ratio: 0.37 MITRAL VALVE                TRICUSPID VALVE MV Area (PHT): 3.60 cm     TR Peak grad:   58.4 mmHg MV Decel Time: 211 msec     TR Vmax:        382.00 cm/s MV E velocity: 152.50 cm/s  SHUNTS                             Systemic VTI:  0.17 m                             Systemic Diam: 2.30 cm Cherlynn Kaiser MD Electronically signed by Cherlynn Kaiser MD Signature Date/Time: 06/03/2020/3:01:49 PM    Final (Updated)     Scheduled Meds: . Chlorhexidine Gluconate Cloth  6 each Topical Q0600  . collagenase   Topical Daily  . furosemide  80 mg Intravenous Q12H  . insulin aspart  0-9 Units Subcutaneous TID WC  . insulin glargine  20 Units Subcutaneous Daily  . metoprolol tartrate  25 mg Oral BID  . mupirocin ointment  1 application Nasal BID   . sodium chloride flush  3 mL Intravenous Q12H  . vancomycin variable dose per unstable renal function (pharmacist dosing)   Does not apply See admin instructions   Continuous Infusions: . heparin 1,350 Units/hr (06/04/20 0800)  . meropenem (MERREM) IV Stopped (06/04/20 1038)     LOS: 1 day   Marylu Lund, MD [facility] Pager On Amion  If 7PM-7AM, please contact night-coverage 06/04/2020, 11:28 AM

## 2020-06-04 NOTE — Progress Notes (Signed)
Spoke with Dr. Wyline Copas and agree to switch vancomycin to linezolid. Will also switch from meropenem to ceftriaxone and PO Flagyl for Gram negative and anaerobic coverage.   Esmeralda Links (PharmD Candidate 2022)

## 2020-06-04 NOTE — Progress Notes (Signed)
[place] for heparin Indication: hx bioprosthetic AVR, stroke, afib  Allergies  Allergen Reactions  . Ibuprofen Nausea And Vomiting  . Sulfamethoxazole-Trimethoprim Itching, Swelling and Rash    Swelling of the face  . Sulfonamide Derivatives Itching, Swelling and Rash    Swelling of the face  . Tape Rash    Paper tape is ok  . Tramadol Nausea And Vomiting  . Doxycycline Nausea Only  . Hydrocil [Psyllium] Nausea And Vomiting  . Bactrim Itching, Swelling and Rash  . Red Dye Itching and Rash    Patient Measurements: Height: 5\' 1"  (154.9 cm) Weight: 85 kg (187 lb 6.3 oz) IBW/kg (Calculated) : 47.8 Heparin Dosing Weight: 82 kg  Vital Signs: Temp: 97.3 F (36.3 C) (11/17 1200) Temp Source: Axillary (11/17 1200) BP: 159/57 (11/17 1300) Pulse Rate: 82 (11/17 1300)  Labs: Recent Labs    06/03/20 0407 06/03/20 0619 06/03/20 0815 06/03/20 1008 06/03/20 1009 06/03/20 1730 06/04/20 0423 06/04/20 1259  HGB 12.5  --   --   --   --   --  12.0  --   HCT 41.7  --   --   --   --   --  39.5  --   PLT 199  --   --   --   --   --  168  --   APTT 32  --   --  32  --   --   --   --   LABPROT 16.1*  --   --   --   --   --  16.6*  --   INR 1.3*  --   --   --   --   --  1.4*  --   HEPARINUNFRC  --   --   --   --   --  0.23* 0.26* 0.16*  CREATININE 2.92*  --   --   --   --   --  2.58*  --   TROPONINIHS  --  161* 151*  --  174*  --   --   --     Estimated Creatinine Clearance: 18.4 mL/min (A) (by C-G formula based on SCr of 2.58 mg/dL (H)).   Medications:  - on warfarin PTA  Assessment: Patient is a 74 y.o F with hx bioprosthetic AVR, stroke, and afib on warfarin PTA presented to the ED on 11/16 with fever. Pharmacy is consulted to transition to heparin drip on admission.  Today, 06/04/2020: -HL 0.16, lower than before despite rate increase - cbc WNL - scr back at baseline - infusion was not paused, no line issues, and no bleeding per  RN  Goal of Therapy:  Heparin level 0.3-0.7 units/ml Monitor platelets by anticoagulation protocol: Yes   Plan:  - bolus heparin 2000 units iv once - increase drip to 1500 units/hr - check 8 hr heparin level - Daily heparin level & CBC - monitor for s/s bleeding  Napoleon Form  06/04/2020, 1:44 PM

## 2020-06-04 NOTE — TOC Initial Note (Signed)
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 Apparently per EMS, the patient originally had called out for a nosebleed but upon arrival the patient was found to be febrile, tachypneic and tachycardic with difficulty clearing secretions.   Apparently, after her procedure 4 days ago, she was not felt to be a candidate for percutaneous intervention because of flush SFA occlusion on both sides and only would be a candidate for local wound unless they did not heal then she may need amputations.   Currently she is awake and initially seemed confused due to her difficulty speaking.  Husband was unable to be contacted by phone to question baseline.  Stat head CT was ordered.  After further discussion with the patient she admitted she has chronic difficulties with speech  ED Course: Febrile, tachycardic, tachypneic,  hypoxic (SpO2 84%) placed on 2 LPM. Notable Labs: Glucose 239, BUN 61, creatinine 2.92, calcium 10.2, alk phos 136, albumin 2.8, lactic acid 1.7, WBC 14, Hb 12.5, INR 1.3, BNP 1883 (chronically elevated at about baseline), troponin 161.  VBG: pH 7.39, PCO2 54, PO2 26.8, bicarb 32.  Noted to be in atrial fibrillation with RVR.  Notable Imaging: CXR concerning for CHF pattern.  Started on sepsis protocol and  initial LR boluses were discontinued prior to giving patient.  She was given LR maintenance fluid and received Lasix 80 mg IV x1, started on Cardizem drip, Tylenol, vancomycin and Zosyn. plan is to return to home vs snf placment Following for progression and to see if snf is needed. Expected Discharge Plan: Home/Self Care Barriers to Discharge: No Barriers Identified   Patient Goals and CMS Choice Patient states their goals for this hospitalization and ongoing recovery are:: to go home CMS Medicare.gov Compare Post Acute Care list provided to:: Patient    Expected Discharge Plan and Services Expected Discharge Plan: Home/Self Care   Discharge Planning Services: CM Consult   Living arrangements for the past 2 months: Single Family Home Expected Discharge Date:  (unknown)                                    Prior Living Arrangements/Services Living arrangements for the past 2 months: Single Family Home Lives with:: Spouse Patient language and need for interpreter reviewed:: Yes Do you feel safe going back to the place where you live?: Yes      Need for Family Participation in Patient Care: Yes (Comment) Care giver support system in place?: Yes (comment)   Criminal Activity/Legal Involvement Pertinent to Current Situation/Hospitalization: No - Comment as needed  Activities of Daily Living Home Assistive Devices/Equipment: Cane (specify quad or straight), CBG Meter, Blood pressure cuff, Eyeglasses, Scales, Walker (specify type), Shower chair with back, Oxygen, Wheelchair, Dentures (  specify type), Other (Comment) (single point cane, 4 wheeled walker, tub/shower unit, upper/lower dentures) ADL Screening (condition at time of admission) Patient's cognitive ability adequate to safely complete daily activities?: No (Patient very sleepy and having a hard time getting words out) Is the patient deaf or have difficulty hearing?: No Does the patient have difficulty seeing, even when  wearing glasses/contacts?: No Does the patient have difficulty concentrating, remembering, or making decisions?: Yes Patient able to express need for assistance with ADLs?: Yes Does the patient have difficulty dressing or bathing?: Yes Independently performs ADLs?: No Communication: Independent (speech slurred) Dressing (OT): Needs assistance Is this a change from baseline?: Change from baseline, expected to last >3 days Grooming: Needs assistance Is this a change from baseline?: Change from baseline, expected to last >3 days Feeding: Needs assistance Is this a change from baseline?: Change from baseline, expected to last >3 days Bathing: Needs assistance Is this a change from baseline?: Change from baseline, expected to last >3 days Toileting: Needs assistance Is this a change from baseline?: Change from baseline, expected to last >3days In/Out Bed: Needs assistance Is this a change from baseline?: Change from baseline, expected to last >3 days Walks in Home: Needs assistance Is this a change from baseline?: Change from baseline, expected to last >3 days Does the patient have difficulty walking or climbing stairs?: Yes (secondary to weakness and lethargy) Weakness of Legs: Both Weakness of Arms/Hands: Both  Permission Sought/Granted                  Emotional Assessment Appearance:: Appears stated age Attitude/Demeanor/Rapport: Engaged Affect (typically observed): Calm Orientation: : Oriented to Self, Oriented to Place, Oriented to  Time, Oriented to Situation Alcohol / Substance Use: Not Applicable Psych Involvement: No (comment)  Admission diagnosis:  Fever [R50.9] Atrial fibrillation with rapid ventricular response (HCC) [I48.91] Atrial fibrillation with RVR (HCC) [I48.91] Ulcer of left lower extremity with fat layer exposed ([place]) [L97.922] Cellulitis of both lower extremities [L03.115, L03.116] Fever, unspecified fever cause [R50.9] Urinary tract infection without  hematuria, site unspecified [N39.0] Patient Active Problem List   Diagnosis Date Noted  . Atrial fibrillation with rapid ventricular response ([place]) 06/03/2020  . Sepsis ([place]) 06/03/2020  . ESRD (end stage renal disease) ([place])   . Venous stasis ulcers of both lower extremities ([place]) 01/31/2020  . Venous stasis dermatitis of both lower extremities 01/31/2020  . Claudication of both lower extremities ([place]) 01/31/2020  . Chronic venous insufficiency 12/31/2019  . Heart failure ([place]) 10/02/2019  . Dyspnea 08/20/2019  . COPD (chronic obstructive pulmonary disease) ([place]) 08/20/2019  . Hematoma of arm, left, subsequent encounter 07/23/2019  . Obesity (BMI 30-39.9) 12/20/2018  . Acute respiratory failure with hypoxia ([place]) 12/19/2018  . Gait disorder 11/22/2018  . History of seizure disorder 09/12/2018  . History of stroke 09/12/2018  . Edema 08/28/2018  . Hypertension secondary to other renal disorders 03/22/2018  . Proteinuria 03/22/2018  . Malignant neoplasm of lung ([place]) 03/22/2018  . Breast pain in female 02/04/2018  . Lung cancer ([place]) 11/30/2017  . Adenocarcinoma of lung, stage 1, right ([place]) 11/25/2017  . Chronic respiratory failure with hypoxia ([place]) 12/27/2016  . Acute on chronic diastolic CHF (congestive heart failure) ([place]) 12/27/2016  . Left upper extremity swelling 12/27/2016  . Pain of left breast 12/27/2016  . Arm muscle atrophy 11/16/2016  . Insomnia 02/25/2016  . Renal cyst 11/21/2015  . Renal mass, right 11/10/2015  . Shoulder pain, right 08/13/2015  . Weight gain 08/12/2014  .  Multinodular goiter 05/01/2014  . Encounter for therapeutic drug monitoring 08/14/2013  . S/P aortic valve replacement with bioprosthetic valve and maze procedure 04/12/2013  . Nodule of right lung 04/07/2013  . Nodule of left lung 04/07/2013  . Pain in limb 05/22/2012  . Dermatitis 01/26/2012  . Long term (current) use of anticoagulants 08/17/2011  . Anemia in chronic renal disease  05/13/2011  . Hypokalemia 04/26/2011  . Immunosuppression ([place]) 04/26/2011  . Hypertension associated with diabetes ([place]) 04/26/2011  . Renal transplant recipient 03/16/2011  . Low back pain 02/23/2011  . Hypersalivation 11/24/2010  . AORTIC STENOSIS 08/20/2010  . DISTURBANCE OF SALIVARY SECRETION 07/28/2010  . NAUSEA 04/14/2010  . SMOKER 10/07/2009  . ALOPECIA 10/07/2009  . CAROTID STENOSIS 03/04/2009  . AF (paroxysmal atrial fibrillation) ([place]) 12/31/2008  . NEOPLASM, MALIGNANT, KIDNEY 07/02/2008  . APHASIA DUE TO CEREBROVASCULAR DISEASE 07/02/2008  . Chronic fatigue 05/21/2008  . Hyperlipidemia LDL goal <70 09/27/2007  . Constipation 09/27/2007  . SLEEP APNEA 09/27/2007  . CHOLELITHIASIS 06/09/2007  . HELICOBACTER PYLORI INFECTION, HX OF 06/08/2007  . Type 2 diabetes mellitus with chronic kidney disease, with long-term current use of insulin ([name]) 05/23/2007  . Gout 05/23/2007  . 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 MYOCARDIAL INFARCTION, HX OF 05/23/2007  . Coronary atherosclerosis 05/23/2007  . GERD 05/23/2007  . COLONIC POLYPS, HX OF 05/23/2007  . DIVERTICULOSIS, COLON 07/01/2005   PCP:  [name], MD Pharmacy:   [id number] [place], [id number]: [phone]: [phone]    Social Determinants of Health (SDOH) Interventions    Readmission Risk Interventions Readmission Risk Prevention Plan 10/03/2019  Transportation Screening Complete  Medication Review ([place] Complete  [place] or [place]  SW Recovery Care/Counseling Consult Complete  [place] Applicable  Some recent data might be hidden

## 2020-06-04 NOTE — Progress Notes (Signed)
[place] for heparin Indication: hx bioprosthetic AVR, stroke, afib  Allergies  Allergen Reactions  . Ibuprofen Nausea And Vomiting  . Sulfamethoxazole-Trimethoprim Itching, Swelling and Rash    Swelling of the face  . Sulfonamide Derivatives Itching, Swelling and Rash    Swelling of the face  . Tape Rash    Paper tape is ok  . Tramadol Nausea And Vomiting  . Doxycycline Nausea Only  . Hydrocil [Psyllium] Nausea And Vomiting  . Bactrim Itching, Swelling and Rash  . Red Dye Itching and Rash    Patient Measurements: Weight: 82.5 kg (181 lb 14.1 oz) Heparin Dosing Weight: 82 kg  Vital Signs: Temp: 98.1 F (36.7 C) (11/17 0000) Temp Source: Axillary (11/17 0000) BP: 146/65 (11/17 0400) Pulse Rate: 85 (11/17 0400)  Labs: Recent Labs    06/03/20 0407 06/03/20 0619 06/03/20 0815 06/03/20 1008 06/03/20 1009 06/03/20 1730 06/04/20 0423  HGB 12.5  --   --   --   --   --  12.0  HCT 41.7  --   --   --   --   --  39.5  PLT 199  --   --   --   --   --  168  APTT 32  --   --  32  --   --   --   LABPROT 16.1*  --   --   --   --   --   --   INR 1.3*  --   --   --   --   --   --   HEPARINUNFRC  --   --   --   --   --  0.23* 0.26*  CREATININE 2.92*  --   --   --   --   --   --   TROPONINIHS  --  161* 151*  --  174*  --   --     Estimated Creatinine Clearance: 15.6 mL/min (A) (by C-G formula based on SCr of 2.92 mg/dL (H)).   Medications:  - on warfarin PTA  Assessment: Patient is a 74 y.o F with hx bioprosthetic AVR, stroke, and afib on warfarin PTA presented to the ED on 11/16 with fever. Pharmacy is consulted to transition to heparin drip on admission.  Today, 06/04/2020: -HL 0.26 subtherapeutic on 1200 units/hr - cbc WNL - scr 2.92 (crcl~15) -No infusion problems or bleeding reported  Goal of Therapy:  Heparin level 0.3-0.7 units/ml Monitor platelets by anticoagulation protocol: Yes   Plan:  - increase drip to 1350  units/hr - check 8 hr heparin level - Daily heparin level & CBC - monitor for s/s bleeding  Dolly Rias RPh 06/04/2020, 4:59 AM

## 2020-06-04 NOTE — Progress Notes (Signed)
Progress Note  Patient Name: [name] Date of Encounter: 06/04/2020  [facility] Cardiologist: [name], MD   Subjective   No complaints except overall discomfort   Inpatient Medications    Scheduled Meds: . Chlorhexidine Gluconate Cloth  6 each Topical Q0600  . collagenase   Topical Daily  . furosemide  80 mg Intravenous Q12H  . insulin aspart  0-9 Units Subcutaneous TID WC  . insulin glargine  20 Units Subcutaneous Daily  . metoprolol tartrate  25 mg Oral BID  . mupirocin ointment  1 application Nasal BID  . sodium chloride flush  3 mL Intravenous Q12H  . vancomycin variable dose per unstable renal function (pharmacist dosing)   Does not apply See admin instructions   Continuous Infusions: . heparin 1,350 Units/hr (06/04/20 0616)  . meropenem (MERREM) IV Stopped (06/03/20 2220)   PRN Meds: acetaminophen **OR** acetaminophen, HYDROmorphone (DILAUDID) injection, polyethylene glycol   Vital Signs    Vitals:   06/04/20 0400 06/04/20 0424 06/04/20 0500 06/04/20 0600  BP: (!) 146/65  (!) 133/102 (!) 154/73  Pulse: 85  88 89  Resp: 17  (!) 24 20  Temp: 98.3 F (36.8 C)     TempSrc: Axillary     SpO2: 92%  96% 97%  Weight:  82.5 kg      Intake/Output Summary (Last 24 hours) at 06/04/2020 0754 Last data filed at 06/04/2020 0640 Gross per 24 hour  Intake 401.56 ml  Output 1800 ml  Net -1398.44 ml   Last 3 Weights 06/04/2020 06/03/2020 05/30/2020  Weight (lbs) 181 lb 14.1 oz 180 lb 12.4 oz 180 lb  Weight (kg) 82.5 kg 82 kg 81.647 kg      Telemetry    Atrial fib - Personally Reviewed  ECG    No  - Personally Reviewed  Physical Exam   GEN: No acute distress.   Neck: + JVD Cardiac: irreg irreg, no murmurs, rubs, or gallops.  Respiratory: Clear to auscultation bilaterally. GI: Soft, nontender, non-distended  MS: 1-2+ edema; Lt leg with dressing, leg more pink No deformity. Neuro:  Nonfocal  Psych: Normal affect   Labs    High  Sensitivity Troponin:   Recent Labs  Lab 06/03/20 0619 06/03/20 0815 06/03/20 1009  TROPONINIHS 161* 151* 174*      Chemistry Recent Labs  Lab 05/30/20 0624 06/03/20 0407 06/04/20 0423  NA 138 141 138  K 4.1 4.6 3.8  CL 95* 100 101  CO2  --  30 28  GLUCOSE 400* 239* 113*  BUN 75* 61* 52*  CREATININE 3.20* 2.92* 2.58*  CALCIUM  --  10.2 9.7  PROT  --  7.2  --   ALBUMIN  --  2.8*  --   AST  --  23  --   ALT  --  15  --   ALKPHOS  --  136*  --   BILITOT  --  1.1  --   GFRNONAA  --  16* 19*  ANIONGAP  --  11 9     Hematology Recent Labs  Lab 05/30/20 0624 06/03/20 0407 06/04/20 0423  WBC  --  14.2* 16.0*  RBC  --  5.21* 4.96  HGB 15.3* 12.5 12.0  HCT 45.0 41.7 39.5  MCV  --  80.0 79.6*  MCH  --  24.0* 24.2*  MCHC  --  30.0 30.4  RDW  --  21.4* 21.0*  PLT  --  199 168    BNP Recent Labs  Lab 06/03/20 0619  BNP 1,883.6*     DDimer No results for input(s): DDIMER in the last 168 hours.   Radiology    CT ABDOMEN PELVIS WO CONTRAST  Result Date: 06/03/2020 CLINICAL DATA:  Generalized abdominal pain. Fever. Tachycardia. Renal transplant patient. Personal history of lung carcinoma. EXAM: CT ABDOMEN AND PELVIS WITHOUT CONTRAST TECHNIQUE: Multidetector CT imaging of the abdomen and pelvis was performed following the standard protocol without IV contrast. COMPARISON:  PET-CT on 11/03/2017 FINDINGS: Lower chest: No acute findings.  Stable cardiomegaly. Hepatobiliary: No mass visualized on this unenhanced exam. Prior cholecystectomy. No evidence of biliary obstruction. Pancreas: No mass or inflammatory process visualized on this unenhanced exam. Spleen:  Within normal limits in size. Adrenals/Urinary tract: A 1.9 cm right adrenal mass is stable, consistent with benign adenoma. Native left kidney is absent. Diffuse atrophy of the native right kidney is seen which contains a 2 cm fluid attenuation cyst. Renal transplant is seen in the left iliac fossa. Several calculi  are seen in the lower pole of the renal transplant, largest measuring 9 mm. No evidence of transplant hydronephrosis or perinephric fluid collections. Unremarkable unopacified urinary bladder. Stomach/Bowel: No evidence of obstruction, inflammatory process, or abnormal fluid collections. Diverticulosis is seen mainly involving the sigmoid colon, however there is no evidence of diverticulitis. Vascular/Lymphatic: No pathologically enlarged lymph nodes identified. No evidence of abdominal aortic aneurysm. Aortic and diffuse peripheral vascular atherosclerotic calcification noted. Reproductive: Prior hysterectomy noted. Adnexal regions are unremarkable in appearance. Other:  New diffuse body wall edema since prior exam. Musculoskeletal:  No suspicious bone lesions identified. IMPRESSION: New diffuse body wall edema. Left iliac fossa renal transplant, with nephrolithiasis. No evidence of transplant hydronephrosis or perinephric fluid collections. Colonic diverticulosis, without radiographic evidence of diverticulitis. Stable benign right adrenal adenoma. Aortic Atherosclerosis (ICD10-I70.0). Electronically Signed   By: Marlaine Hind M.D.   On: 06/03/2020 08:50   CT HEAD WO CONTRAST  Result Date: 06/03/2020 CLINICAL DATA:  Altered mental status.  History of lung carcinoma EXAM: CT HEAD WITHOUT CONTRAST TECHNIQUE: Contiguous axial images were obtained from the base of the skull through the vertex without intravenous contrast. COMPARISON:  Head CT June 19, 2008 and brain MRI June 20, 2008 FINDINGS: Brain: The ventricles and sulci are within normal limits for age. There is no appreciable intracranial mass, hemorrhage, extra-axial fluid collection, or midline shift. There is evidence of a prior infarct at the left posterior temporal-superior anterior occipital-anterior inferior left parietal junction. Elsewhere there is minimal small vessel disease in the centra semiovale bilaterally. No acute infarct is  appreciable. Vascular: No hyperdense vessel. There is calcification in each carotid siphon region. Skull: Bony calvarium appears intact. 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 Other visualized paranasal sinuses are clear. Orbits appear symmetric bilaterally. Other: Mastoid air cells are clear. IMPRESSION: Prior infarct at the junction of the left temporal-parietal, and occipital lobes. Minimal small vessel disease elsewhere. No acute infarct appreciable. No mass or hemorrhage. Foci of arterial vascular calcification noted. Electronically Signed   By: Lowella Grip III M.D.   On: 06/03/2020 08:44   DG Chest Port 1 View  Result Date: 06/03/2020 CLINICAL DATA:  Suspected sepsis EXAM: PORTABLE CHEST 1 VIEW COMPARISON:  10/01/2019 FINDINGS: Cardiomegaly. Aortic valve replacement and left atrial clipping. Congested appearance of central vessels with diffuse interstitial opacity and cephalized blood flow. No visible effusion or pneumothorax IMPRESSION: CHF pattern. Electronically Signed   By: Angelica Chessman  Watts M.D.   On: 06/03/2020 04:55   ECHOCARDIOGRAM COMPLETE  Result Date: 06/03/2020    ECHOCARDIOGRAM REPORT   Patient Name:   ARLENA MARSAN Date of Exam: 06/03/2020 Medical Rec #:  [id number]    Height:       60.0 in Accession #:    [id number]   Weight:       180.8 lb Date of Birth:  1945/09/28    BSA:          1.788 m Patient Age:    74 years     BP:           136/96 mmHg Patient Gender: F            HR:           96 bpm. Exam Location:  Inpatient Procedure: 2D Echo, Cardiac Doppler and Color Doppler                               MODIFIED REPORT: This report was modified by Cherlynn Kaiser MD on 06/03/2020 due to revision.  Indications:     I50.33 Acute on chronic diastolic (congestive) heart failure;                  Fever 780.6 / R50.9  History:         Patient has prior history of Echocardiogram examinations, most                  recent  10/02/2019. Previous Myocardial Infarction, COPD and                  Carotid Disease, Aortic Valve Disease, Arrythmias:Atrial                  Fibrillation; Risk Factors:Hypertension, Former Smoker,                  Dyslipidemia, Diabetes and GERD. Sepsis.                  Aortic Valve: 23 mm Edwards bovine valve is present in the                  aortic position. Procedure Date: 04/07/2013.  Sonographer:     Jonelle Sidle Dance Referring Phys:  [id number] Harold Hedge Diagnosing Phys: Cherlynn Kaiser MD IMPRESSIONS  1. Left ventricular ejection fraction, by estimation, is 55 to 60%. The left ventricle has normal function. The left ventricle demonstrates regional wall motion abnormalities (see scoring diagram/findings for description). There is mild left ventricular  hypertrophy. Left ventricular diastolic parameters are indeterminate.  2. Right ventricular systolic function is moderately reduced. The right ventricular size is moderately enlarged. There is severely elevated pulmonary artery systolic pressure. The estimated right ventricular systolic pressure is 31.5 mmHg.  3. Left atrial size was severely dilated.  4. Right atrial size was moderately dilated.  5. Suspect small flail segment on anterior mitral leaflet causing very eccentric posteriorly directed mitral valve regurgitation that appears at least moderate-severe. From short axis view may involve A3 scallop.. The mitral valve is abnormal. Moderate to severe mitral valve regurgitation. Severe mitral annular calcification.  6. Tricuspid valve regurgitation is severe.  7. The aortic valve has been repaired/replaced. Aortic valve regurgitation is not visualized. There is a 23 mm Edwards bovine valve present in the aortic position. Procedure Date: 04/07/2013. Echo findings are consistent with normal structure and function of the aortic valve prosthesis.  Aortic valve mean gradient measures 18.2 mmHg.  8. The inferior vena cava is dilated in size with <50% respiratory  variability, suggesting right atrial pressure of 15 mmHg. Comparison(s): A prior study was performed on 10/02/19. Prior images reviewed side by side. Mitral regurgitation may not have significantly changed, but is more evident on today's exam. Consider TEE for further evaluation of mitral valve morphology and severity of regurgitation. No significant change in aortic valve prosthesis. FINDINGS  Left Ventricle: Left ventricular ejection fraction, by estimation, is 55 to 60%. The left ventricle has normal function. The left ventricle demonstrates regional wall motion abnormalities. The left ventricular internal cavity size was normal in size. There is mild left ventricular hypertrophy. Left ventricular diastolic parameters are indeterminate.  LV Wall Scoring: The posterior wall and basal inferior segment are hypokinetic. Right Ventricle: The right ventricular size is moderately enlarged. No increase in right ventricular wall thickness. Right ventricular systolic function is moderately reduced. There is severely elevated pulmonary artery systolic pressure. The tricuspid regurgitant velocity is 3.82 m/s, and with an assumed right atrial pressure of 15 mmHg, the estimated right ventricular systolic pressure is 36.6 mmHg. Left Atrium: Left atrial size was severely dilated. Right Atrium: Right atrial size was moderately dilated. Pericardium: There is no evidence of pericardial effusion. Mitral Valve: Suspect small flail segment on anterior mitral leaflet causing very eccentric posteriorly directed mitral valve regurgitation that appears at least moderate-severe. From short axis view may involve A3 scallop. The mitral valve is abnormal. There is moderate calcification of the mitral valve leaflet(s). Severe mitral annular calcification. Moderate to severe mitral valve regurgitation. Tricuspid Valve: Systolic reversals in hepatic vein Doppler suggest severe TR. The tricuspid valve is normal in structure. Tricuspid valve  regurgitation is severe. Aortic Valve: Grossly normal aortic valve prosthesis. Vmax 3 m/s, DVI 0.37. AT 77 msec, EOA 1.52 cm2, iEOA 0.85 cm2/m2. The aortic valve has been repaired/replaced. Aortic valve regurgitation is not visualized. Aortic valve mean gradient measures 18.2 mmHg. Aortic valve peak gradient measures 35.5 mmHg. Aortic valve area, by VTI measures 1.54 cm. There is a 23 mm Edwards bovine valve present in the aortic position. Procedure Date: 04/07/2013. Echo findings are consistent with normal structure and function of the aortic valve prosthesis. Pulmonic Valve: The pulmonic valve was grossly normal. Pulmonic valve regurgitation is mild to moderate. Aorta: The aortic root was not well visualized. Venous: The inferior vena cava is dilated in size with less than 50% respiratory variability, suggesting right atrial pressure of 15 mmHg. IAS/Shunts: No atrial level shunt detected by color flow Doppler.  LEFT VENTRICLE PLAX 2D LVIDd:         4.90 cm LVIDs:         4.00 cm LV PW:         1.50 cm LV IVS:        1.00 cm LVOT diam:     2.30 cm LV SV:         71 LV SV Index:   40 LVOT Area:     4.15 cm  RIGHT VENTRICLE          IVC RV Basal diam:  4.40 cm  IVC diam: 2.80 cm RV Mid diam:    3.00 cm TAPSE (M-mode): 1.1 cm LEFT ATRIUM             Index       RIGHT ATRIUM           Index LA diam:  4.30 cm 2.40 cm/m  RA Area:     22.90 cm LA Vol (A2C):   92.8 ml 51.90 ml/m RA Volume:   74.80 ml  41.83 ml/m LA Vol (A4C):   76.7 ml 42.89 ml/m LA Biplane Vol: 84.0 ml 46.98 ml/m  AORTIC VALVE AV Area (Vmax):    1.25 cm AV Area (Vmean):   1.19 cm AV Area (VTI):     1.54 cm AV Vmax:           297.95 cm/s AV Vmean:          202.608 cm/s AV VTI:            0.465 m AV Peak Grad:      35.5 mmHg AV Mean Grad:      18.2 mmHg LVOT Vmax:         89.55 cm/s LVOT Vmean:        58.100 cm/s LVOT VTI:          0.172 m LVOT/AV VTI ratio: 0.37 MITRAL VALVE                TRICUSPID VALVE MV Area (PHT): 3.60 cm     TR  Peak grad:   58.4 mmHg MV Decel Time: 211 msec     TR Vmax:        382.00 cm/s MV E velocity: 152.50 cm/s                             SHUNTS                             Systemic VTI:  0.17 m                             Systemic Diam: 2.30 cm Cherlynn Kaiser MD Electronically signed by Cherlynn Kaiser MD Signature Date/Time: 06/03/2020/3:01:49 PM    Final (Updated)     Cardiac Studies  06/03/20 Echo  IMPRESSIONS  1. Left ventricular ejection fraction, by estimation, is 55 to 60%. The  left ventricle has normal function. The left ventricle demonstrates  regional wall motion abnormalities (see scoring diagram/findings for  description). There is mild left ventricular  hypertrophy. Left ventricular diastolic parameters are indeterminate.  2. Right ventricular systolic function is moderately reduced. The right  ventricular size is moderately enlarged. There is severely elevated  pulmonary artery systolic pressure. The estimated right ventricular  systolic pressure is 93.8 mmHg.  3. Left atrial size was severely dilated.  4. Right atrial size was moderately dilated.  5. Suspect small flail segment on anterior mitral leaflet causing very  eccentric posteriorly directed mitral valve regurgitation that appears at  least moderate-severe. From short axis view may involve A3 scallop.. The  mitral valve is abnormal. Moderate  to severe mitral valve regurgitation. Severe mitral annular calcification.  6. Tricuspid valve regurgitation is severe.  7. The aortic valve has been repaired/replaced. Aortic valve  regurgitation is not visualized. There is a 23 mm Edwards bovine valve  present in the aortic position. Procedure Date: 04/07/2013. Echo findings  are consistent with normal structure and  function of the aortic valve prosthesis. Aortic valve mean gradient  measures 18.2 mmHg.  8. The inferior vena cava is dilated in size with <50% respiratory  variability, suggesting right atrial  pressure of 15 mmHg.   Comparison(s): A prior  study was performed on 10/02/19. Prior images  reviewed side by side. Mitral regurgitation may not have significantly  changed, but is more evident on today's exam. Consider TEE for further  evaluation of mitral valve morphology and  severity of regurgitation. No significant change in aortic valve  prosthesis.   FINDINGS  Left Ventricle: Left ventricular ejection fraction, by estimation, is 55  to 60%. The left ventricle has normal function. The left ventricle  demonstrates regional wall motion abnormalities. The left ventricular  internal cavity size was normal in size.  There is mild left ventricular hypertrophy. Left ventricular diastolic  parameters are indeterminate.     LV Wall Scoring:  The posterior wall and basal inferior segment are hypokinetic.   Right Ventricle: The right ventricular size is moderately enlarged. No  increase in right ventricular wall thickness. Right ventricular systolic  function is moderately reduced. There is severely elevated pulmonary  artery systolic pressure. The tricuspid  regurgitant velocity is 3.82 m/s, and with an assumed right atrial  pressure of 15 mmHg, the estimated right ventricular systolic pressure is  62.3 mmHg.   Left Atrium: Left atrial size was severely dilated.   Right Atrium: Right atrial size was moderately dilated.   Pericardium: There is no evidence of pericardial effusion.   Mitral Valve: Suspect small flail segment on anterior mitral leaflet  causing very eccentric posteriorly directed mitral valve regurgitation  that appears at least moderate-severe. From short axis view may involve A3  scallop. The mitral valve is abnormal.  There is moderate calcification of the mitral valve leaflet(s). Severe  mitral annular calcification. Moderate to severe mitral valve  regurgitation.   Tricuspid Valve: Systolic reversals in hepatic vein Doppler suggest severe  TR. The tricuspid  valve is normal in structure. Tricuspid valve  regurgitation is severe.   Aortic Valve: Grossly normal aortic valve prosthesis. Vmax 3 m/s, DVI  0.37. AT 77 msec, EOA 1.52 cm2, iEOA 0.85 cm2/m2. The aortic valve has  been repaired/replaced. Aortic valve regurgitation is not visualized.  Aortic valve mean gradient measures 18.2  mmHg. Aortic valve peak gradient measures 35.5 mmHg. Aortic valve area, by  VTI measures 1.54 cm. There is a 23 mm Edwards bovine valve present in  the aortic position. Procedure Date: 04/07/2013. Echo findings are  consistent with normal structure and  function of the aortic valve prosthesis.   Pulmonic Valve: The pulmonic valve was grossly normal. Pulmonic valve  regurgitation is mild to moderate.   Aorta: The aortic root was not well visualized.   Venous: The inferior vena cava is dilated in size with less than 50%  respiratory variability, suggesting right atrial pressure of 15 mmHg.   IAS/Shunts: No atrial level shunt detected by color flow Doppler.   ECHO: 10/02/2019 1. Left ventricular ejection fraction, by estimation, is 60 to 65%. The  left ventricle has low normal function. The left ventricle has no regional  wall motion abnormalities. There is mild left ventricular hypertrophy.  Left ventricular diastolic function  could not be evaluated. Left ventricular diastolic function could not be  evaluated. Elevated left ventricular end-diastolic pressure.  2. Right ventricular systolic function is normal. The right ventricular  size is normal. There is severely elevated pulmonary artery systolic  pressure. The estimated right ventricular systolic pressure is 76.2 mmHg.  3. Left atrial size was moderately dilated.  4. Right atrial size was mildly dilated.  5. The mitral valve is abnormal. Mild to moderate mitral valve  regurgitation.  6. Tricuspid valve  regurgitation is mild to moderate.  7. The aortic valve has been repaired/replaced. Aortic  valve  regurgitation is trivial. There is a 23 mm Edwards bovine valve present in  the aortic position. Procedure Date: 04/12/2013. Aortic valve area, by VTI  measures 1.66 cm. Aortic valve mean  gradient measures 13.0 mmHg. Aortic valve Vmax measures 2.72 m/s.  8. The inferior vena cava is dilated in size with <50% respiratory  variability, suggesting right atrial pressure of 15 mmHg.   Comparison(s): Changes from prior study are noted. 12/20/2018: LVEF 60-65%,  RVSP 44 mmHg.   PV CATH: 05/30/2020 Operative findings: 1. Bilateral flush superficial femoral artery occlusions  2. One-vessel very diseased calcified peroneal runoff bilaterally Patient overall is a very poor operative candidate. 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 She is not a candidate for percutaneous intervention because she has a flush SFA occlusion on both sides. Her only options at this point would be continued local wound care to see if the wounds will heal. These have not healed in several months so if eventually she decides the pain from the wounds or the wound burden is too much for her she would be a candidate for bilateral or unilateral above-knee amputation. I discussed all of this with the patient and her husband today. She will follow up with me on an as-needed basis.  Patient Profile     74 y.o. 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   Assessment & Plan    1. Atrial fib,  RVR -Duration unknown -Prior to admission she was on metoprolol 12.5 mg twice daily. -Her Coumadin was held for the PV procedure, and she is not yet therapeutic, she has been started on heparin. -Her heart rate has improved with Cardizem now on lopressor 25 BID and rate controlled.   2. Acute on chronic diastolic CHF -Prior to admission she was on Demadex 100 mg/day -neg 1385 since admit and wt actually up 0.5 kg - lasix 80 mg every 12 hours.  3.  CKD IV -Her creatinine has been greater than 2.5 for most of the last 6 months. -Follow creatinine closely with diuresis, may need Nephrology input today 2.58   4.  Sepsis -She got a dose of cefepime and vancomycin in the ER.  She also got Zosyn. -Antibiotics per IM, blood cultures and urine cultures pending  5.  S/p AVR with bioprosthetic stable on echo with valve 23 mm Edwards bovine valve present in the aortic position mild regurg.. Procedure Date: 04/12/2013. Aortic valve area, by VTI measures 1.66 cm. Aortic valve mean gradient measures 13.0 mmHg. Aortic valve Vmax measures 2.72 m/s.       For questions or updates, please contact [place] Please consult [email or web address] for contact info under        Signed, Cecilie Kicks, NP  06/04/2020, 7:54 AM

## 2020-06-05 ENCOUNTER — Ambulatory Visit: Payer: Medicare Other

## 2020-06-05 ENCOUNTER — Inpatient Hospital Stay (HOSPITAL_COMMUNITY): Payer: Medicare Other

## 2020-06-05 DIAGNOSIS — L03116 Cellulitis of left lower limb: Secondary | ICD-10-CM | POA: Diagnosis not present

## 2020-06-05 DIAGNOSIS — L03115 Cellulitis of right lower limb: Secondary | ICD-10-CM | POA: Diagnosis not present

## 2020-06-05 DIAGNOSIS — I5033 Acute on chronic diastolic (congestive) heart failure: Secondary | ICD-10-CM | POA: Diagnosis not present

## 2020-06-05 DIAGNOSIS — I4891 Unspecified atrial fibrillation: Secondary | ICD-10-CM | POA: Diagnosis not present

## 2020-06-05 LAB — BASIC METABOLIC PANEL
Anion gap: 13 (ref 5–15)
BUN: 46 mg/dL — ABNORMAL HIGH (ref 8–23)
CO2: 28 mmol/L (ref 22–32)
Calcium: 9.1 mg/dL (ref 8.9–10.3)
Chloride: 95 mmol/L — ABNORMAL LOW (ref 98–111)
Creatinine, Ser: 2.65 mg/dL — ABNORMAL HIGH (ref 0.44–1.00)
GFR, Estimated: 18 mL/min — ABNORMAL LOW (ref >60)
Glucose, Bld: 123 mg/dL — ABNORMAL HIGH (ref 70–99)
Potassium: 5.9 mmol/L — ABNORMAL HIGH (ref 3.5–5.1)
Sodium: 136 mmol/L (ref 135–145)

## 2020-06-05 LAB — CBC
HCT: 42.2 % (ref 36.0–46.0)
Hemoglobin: 12.4 g/dL (ref 12.0–15.0)
MCH: 23.9 pg — ABNORMAL LOW (ref 26.0–34.0)
MCHC: 29.4 g/dL — ABNORMAL LOW (ref 30.0–36.0)
MCV: 81.3 fL (ref 80.0–100.0)
Platelets: 168 10*3/uL (ref 150–400)
RBC: 5.19 MIL/uL — ABNORMAL HIGH (ref 3.87–5.11)
RDW: 21.4 % — ABNORMAL HIGH (ref 11.5–15.5)
WBC: 14.5 10*3/uL — ABNORMAL HIGH (ref 4.0–10.5)
nRBC: 0 % (ref 0.0–0.2)

## 2020-06-05 LAB — URINE CULTURE: Culture: 10000 — AB

## 2020-06-05 LAB — GLUCOSE, CAPILLARY
Glucose-Capillary: 101 mg/dL — ABNORMAL HIGH (ref 70–99)
Glucose-Capillary: 121 mg/dL — ABNORMAL HIGH (ref 70–99)
Glucose-Capillary: 138 mg/dL — ABNORMAL HIGH (ref 70–99)
Glucose-Capillary: 76 mg/dL (ref 70–99)
Glucose-Capillary: 118 mg/dL — ABNORMAL HIGH (ref 70–99)

## 2020-06-05 LAB — HEPARIN LEVEL (UNFRACTIONATED)
Heparin Unfractionated: 0.3 IU/mL (ref 0.30–0.70)
Heparin Unfractionated: 0.4 IU/mL (ref 0.30–0.70)

## 2020-06-05 LAB — CK: Total CK: 98 U/L (ref 38–234)

## 2020-06-05 MED ORDER — CEFAZOLIN SODIUM-DEXTROSE 1-4 GM/50ML-% IV SOLN
1.0000 g | Freq: Two times a day (BID) | INTRAVENOUS | Status: DC
  Administered 2020-06-05: 1 g via INTRAVENOUS
  Filled 2020-06-05 (×2): qty 50

## 2020-06-05 MED ORDER — CEFAZOLIN SODIUM-DEXTROSE 1-4 GM/50ML-% IV SOLN
1.0000 g | Freq: Three times a day (TID) | INTRAVENOUS | Status: DC
  Filled 2020-06-05: qty 50

## 2020-06-05 MED ORDER — LINEZOLID 600 MG/300ML IV SOLN
600.0000 mg | Freq: Two times a day (BID) | INTRAVENOUS | Status: DC
  Administered 2020-06-05 – 2020-06-06 (×2): 600 mg via INTRAVENOUS
  Filled 2020-06-05 (×2): qty 300

## 2020-06-05 MED ORDER — INSULIN GLARGINE 100 UNIT/ML ~~LOC~~ SOLN
16.0000 [IU] | Freq: Every day | SUBCUTANEOUS | Status: DC
  Administered 2020-06-06 – 2020-06-07 (×2): 16 [IU] via SUBCUTANEOUS
  Filled 2020-06-05 (×2): qty 0.16

## 2020-06-05 MED ORDER — HYDROMORPHONE HCL 1 MG/ML IJ SOLN
1.0000 mg | INTRAMUSCULAR | Status: DC | PRN
  Administered 2020-06-05 – 2020-06-13 (×20): 1 mg via INTRAVENOUS
  Filled 2020-06-05 (×21): qty 1

## 2020-06-05 MED ORDER — HYDROMORPHONE HCL 1 MG/ML IJ SOLN
0.5000 mg | Freq: Once | INTRAMUSCULAR | Status: AC
  Administered 2020-06-05: 0.5 mg via INTRAVENOUS

## 2020-06-05 NOTE — Progress Notes (Signed)
Responded to consult for IV. On arrival, pt care in progress. Will return at later time.

## 2020-06-05 NOTE — TOC Progression Note (Addendum)
Transition of Care [facility]) - Progression Note    Patient Details  Name: Chivonne Rascon MRN: [id number] Date of Birth: Aug 01, 1945  Transition of Care [facility]) CM/SW [place], [place] Phone Number: 06/05/2020, 3:08 PM  Clinical Narrative:   Patient seen in follow up to PT recommendation of SNF.  Unable to carry on meaningful conversation with patient.  Called husband, who is in favor of short term rehab "if that's the recommendation."  He states she has been doing progressively worse for the last month or so. It is just he and she at home, and he has no help.  Explained bed search process to him.  Bed search initiated. TOC will continue to follow during the course of hospitalization.     Expected Discharge Plan: Skilled Nursing Facility Barriers to Discharge: SNF Pending bed offer  Expected Discharge Plan and Services Expected Discharge Plan: [place]   Discharge Planning Services: CM Consult Post Acute Care Choice: [facility] [place] Living arrangements for the past 2 months: Single Family Home Expected Discharge Date:  (unknown)                                     Social Determinants of Health (SDOH) Interventions    Readmission Risk Interventions Readmission Risk Prevention Plan 10/03/2019  Transportation Screening Complete  Medication Review Press photographer) Complete  HRI or [place] Complete  SW Recovery Care/Counseling Consult Complete  [place] Not Applicable  Some recent data might be hidden

## 2020-06-05 NOTE — Progress Notes (Signed)
[place] for heparin Indication: hx bioprosthetic AVR, stroke, afib  Allergies  Allergen Reactions  . Ibuprofen Nausea And Vomiting  . Sulfamethoxazole-Trimethoprim Itching, Swelling and Rash    Swelling of the face  . Sulfonamide Derivatives Itching, Swelling and Rash    Swelling of the face  . Tape Rash    Paper tape is ok  . Tramadol Nausea And Vomiting  . Doxycycline Nausea Only  . Hydrocil [Psyllium] Nausea And Vomiting  . Bactrim Itching, Swelling and Rash  . Red Dye Itching and Rash    Patient Measurements: Height: 5\' 1"  (154.9 cm) Weight: 85.1 kg (187 lb 9.8 oz) IBW/kg (Calculated) : 47.8 Heparin Dosing Weight: 82 kg  Vital Signs: Temp: 98 F (36.7 C) (11/18 0346) Temp Source: Oral (11/18 0346) BP: 158/68 (11/18 0346) Pulse Rate: 86 (11/18 0346)  Labs: Recent Labs     0000 06/03/20 0407 06/03/20 0619 06/03/20 0815 06/03/20 1008 06/03/20 1009 06/03/20 1730 06/04/20 0423 06/04/20 1259 06/05/20 0034  HGB   < > 12.5  --   --   --   --   --  12.0  --  12.4  HCT  --  41.7  --   --   --   --   --  39.5  --  42.2  PLT  --  199  --   --   --   --   --  168  --  168  APTT  --  32  --   --  32  --   --   --   --   --   LABPROT  --  16.1*  --   --   --   --   --  16.6*  --   --   INR  --  1.3*  --   --   --   --   --  1.4*  --   --   HEPARINUNFRC  --   --   --   --   --   --    < > 0.26* 0.16* 0.30  CREATININE  --  2.92*  --   --   --   --   --  2.58*  --  2.65*  TROPONINIHS  --   --  161* 151*  --  174*  --   --   --   --    < > = values in this interval not displayed.    Estimated Creatinine Clearance: 17.9 mL/min (A) (by C-G formula based on SCr of 2.65 mg/dL (H)).   Medications:  - on warfarin PTA  Assessment: Patient is a 74 y.o F with hx bioprosthetic AVR, stroke, and afib on warfarin PTA presented to the ED on 11/16 with fever. Pharmacy is consulted to transition to heparin drip on admission.  Today,  06/05/2020: -HL at 0034 = 0.3 = therapeutic after 2000 unit bolus and rate increase to 1500 units/hr - CBC WNL - Scr 2.65 -no bleeding reported  Goal of Therapy:  Heparin level 0.3-0.7 units/ml Monitor platelets by anticoagulation protocol: Yes   Plan:  - continue heparin drip at 1500 units/hr - Daily heparin level & CBC - monitor for s/s bleeding  Eudelia Bunch, Pharm.D 06/05/2020 8:27 AM

## 2020-06-05 NOTE — Consult Note (Signed)
Consultation Note Date: 06/05/2020   Patient Name: [name]  DOB: April 07, 1946  MRN: [id number]  Age / Sex: 74 y.o., female  PCP: Plotnikov, [name], MD Referring Physician: Donne Hazel, MD  Reason for Consultation: Establishing goals of care  HPI/Patient Profile: 74 y.o. female  with past medical history of CAD status post stent, severe aortic stenosis status post AVR, hypertension, hyperlipidemia, OSA, COPD with intermittent O2 requirement, HFpEF, atrial fibrillation status post maze on Coumadin, type 2 diabetes, end-stage renal disease status post transplant, CKD 4, adenocarcinoma of the lung, embolic CVA with residual aphasia, bilateral lower extremity nonhealing wounds and recent abdominal aortogram with out actionable findings (would likely need eventual AKA) admitted on 06/03/2020 with sepsis, concern for endocarditis, A. fib and worsening renal failure.  Palliative consulted for goals of care.  Clinical Assessment and Goals of Care: Chart reviewed including personal review of pertinent labs and imaging.  I met today with [name].  She was awake and alert in bed but seem to be confused and not really able to participate in any conversation regarding long-term goals of care.  To any question I asked, she stated, "I am here in the room."  I am not really able to discern what has been going on normal able to determine anything regarding her current symptom management needs.  Attempted to call her husband was able to reach him tonight.   SUMMARY OF RECOMMENDATIONS   -Full code/full scope -[name] was not really able to participate in conversation regarding goals this evening.  Will plan to follow-up again with her tomorrow.  I have also attempted to reach out to her husband without success. -Await return call from her husband.  Code Status/Advance Care Planning: Full code    Psycho-social/Spiritual:   Desire for further Chaplaincy support: Did not address today  Additional Recommendations: Caregiving  Support/Resources  Prognosis:   Guarded  Discharge Planning: To Be Determined      Primary Diagnoses: Present on Admission: . Acute on chronic diastolic CHF (congestive heart failure) ([place]) . ESRD (end stage renal disease) ([place]) . Hyperlipidemia LDL goal <70 . Venous stasis ulcers of both lower extremities ([place])   I have reviewed the medical record, interviewed the patient and family, and examined the patient. The following aspects are pertinent.  Past Medical History:  Diagnosis Date  . Acute CHF (congestive heart failure) ([place]) 12/2018  . Adenocarcinoma of lung, stage 1, right ([place]) 11/25/2017  . Anemia, iron deficiency    of chronic disease  . Aortic stenosis    a. Severe AS by echo 11/2012.  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 Asystole ([place])    a. During ENT surgery 2005: developed marked asystole requiring CPR, felt due to vagal reaction (cath nonobst dz).  . Carotid artery disease ([place])    a. Carotid Dopplers performed in August 2013 showed 40-59% left stenosis and 0-39% right; f/u recommended in 2 years.   . Cerebrovascular accident [facility]) 2009   a.  LMCA infarct felt embolic 7867, maintained on chronic coumadin.; denies residual on 04/05/2013  . Cholelithiasis   . Chronic Persistent Atrial Fibrillation 12/31/2008   Qualifier: Diagnosis of  By: [name]    . Coronary artery disease 05/2002   a. Ant MI 2003 s/p PTCA/stent to RCA.   . Diverticulosis of colon   . Esophagitis, reflux   . ESRD (end stage renal disease) ([place])    a. Mass on L kidney per pt s/p nephrectomy - pt states not cancer - [facility] notes indicate ESRD due to HTN/DM - was previously on HD. b. Kidney transplant 02/2011.  Marland Kitchen GERD (gastroesophageal reflux disease)   . Gout   . Hearing loss   . Helicobacter pylori (H. pylori) infection    hx of  .  Hemorrhoids   . Hx of colonic polyps    adenomatous  . Hyperlipidemia   . Hypertension   . Lung nodule seen on imaging study 04/07/2013   1.0 cm ground glass opacity RUL  . Myocardial infarction ([place]) 2003  . Pericardial effusion    a. Small by echo 11/2011.  . S/P aortic valve replacement with bioprosthetic valve and maze procedure 04/12/2013   [name]   . S/P Maze operation for atrial fibrillation 04/12/2013   Complete bilateral atrial lesion set using bipolar radiofrequency and cryothermy ablation with clipping of LA appendage  . Sleep apnea    Pt says testing was positive, intolerant of CPAP.  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  . St[place] ([place]   . Type II diabetes mellitus ([place]    Social History   Socioeconomic History  . Marital status: Married    Spouse name: Not on file  . Number of children: 5  . Years of education: 162 . Highest education level: Not on file  Occupational History  . Occupation: retired    EF[employer] farm manager RETIRED  Tobacco Use  . Smoking status: Former Smoker    Packs/day: 1.00    Years: 30.00    Pack years: 30.00    Types: Cigarettes    Quit date: 07/19/2001    Years since quitting: 18.8  . Smokeless tobacco: Never Used  Vaping Use  . Vaping Use: Never used  Substance and Sexual Activity  . Alcohol use: No  . Drug use: No  . Sexual activity: Not Currently  Other Topics Concern  . Not on file  Social History Narrative   Patient signed a Designated Party Release to allow her spouse RLyndle Herrlichand family and five children to have access to her medical records/information.     Patient lives with husband in a 2 story home.  Has 5 children. Retired from rPersonal assistant  Education: 3 years of college.    Social Determinants of Health   Financial  Resource Strain:   . Difficulty of Paying Living Expenses: Not on file  Food Insecurity: No Food Insecurity  . Worried About RCharity fundraiserin the Last Year: Never true  . Ran Out of Food in the Last Year: Never true  Transportation Needs: No Transportation Needs  . Lack of Transportation (Medical): No  . Lack of Transportation (Non-Medical): No  Physical Activity: Inactive  . Days of Exercise per Week: 0 days  . Minutes of Exercise per Session: 0 min  Stress: Stress  Concern Present  . Feeling of Stress : Rather much  Social Connections:   . Frequency of Communication with Friends and Family: Not on file  . Frequency of Social Gatherings with Friends and Family: Not on file  . Attends Religious Services: Not on file  . Active Member of Clubs or Organizations: Not on file  . Attends Archivist Meetings: Not on file  . Marital Status: Not on file   Family History  Problem Relation Age of Onset  . Stroke Father   . Hypertension Mother   . Diabetes Other   . Diabetes Maternal Grandmother   . Diabetes Son   . Crohn's disease Other        grandson   . Breast cancer Neg Hx   . Stomach cancer Neg Hx   . Esophageal cancer Neg Hx   . Colon cancer Neg Hx   . Pancreatic cancer Neg Hx    Scheduled Meds: . amLODipine  5 mg Oral Daily  . Chlorhexidine Gluconate Cloth  6 each Topical Q0600  . collagenase   Topical Daily  . furosemide  80 mg Intravenous Q12H  . insulin aspart  0-9 Units Subcutaneous TID WC  . insulin glargine  20 Units Subcutaneous Daily  . metoprolol tartrate  25 mg Oral BID  . mupirocin ointment  1 application Nasal BID  . sodium chloride flush  3 mL Intravenous Q12H   Continuous Infusions: .  ceFAZolin (ANCEF) IV    . heparin 1,500 Units/hr (06/04/20 2231)   PRN Meds:.acetaminophen **OR** acetaminophen, HYDROmorphone (DILAUDID) injection, polyethylene glycol Medications Prior to Admission:  Prior to Admission medications   Medication Sig Start  Date End Date Taking? Authorizing Provider  acetaminophen (TYLENOL) 500 MG tablet Take 500 mg by mouth every 6 (six) hours as needed for headache.    Yes [provider]  gabapentin (NEURONTIN) 300 MG capsule Take 1 capsule (300 mg total) by mouth 3 (three) times daily. 10/22/19  Yes Plotnikov, [name], MD  insulin glargine, 1 Unit Dial, (TOUJEO SOLOSTAR) 300 UNIT/ML Solostar Pen Inject 60 Units into the skin every morning. Titrate up by 1 unit a day if needed for goal sugars of 100-130 up to 50 units a day max 02/01/20  Yes Janith Lima, MD  insulin lispro (HUMALOG KWIKPEN) 200 UNIT/ML KwikPen Inject 10 Units into the skin with breakfast, with lunch, and with evening meal. 02/01/20  Yes Janith Lima, MD  levothyroxine (SYNTHROID) 25 MCG tablet TAKE 2 TABLETS (50 MCG TOTAL) BY MOUTH DAILY. 01/25/20  Yes Plotnikov, [name], MD  lidocaine (XYLOCAINE) 5 % ointment Apply 1 application topically 4 (four) times daily as needed. Patient taking differently: Apply 1 application topically daily as needed for mild pain.  02/27/20  Yes Plotnikov, [name], MD  metoprolol tartrate (LOPRESSOR) 25 MG tablet Take 0.5 tablets (12.5 mg total) by mouth 2 (two) times daily. Patient taking differently: Take 25 mg by mouth 2 (two) times daily.  08/22/19  Yes Sherran Needs, NP  oxyCODONE-acetaminophen (PERCOCET/ROXICET) 5-325 MG tablet Take 1 tablet by mouth every 8 (eight) hours as needed for severe pain. Patient taking differently: Take 1 tablet by mouth every 8 (eight) hours as needed for moderate pain or severe pain.  04/22/20  Yes Plotnikov, [name], MD  OXYGEN Inhale 3 L into the lungs continuous.    Yes [provider]  potassium chloride SA (KLOR-CON M20) 20 MEQ tablet Take 1 tablet (20 mEq total) by mouth  daily. 09/19/19 06/03/20 Yes Cleaver, Jossie Ng, NP  predniSONE (DELTASONE) 5 MG tablet Take 5 mg by mouth daily.  03/29/19 08/19/20 Yes [provider]  Propylene Glycol (SYSTANE  BALANCE) 0.6 % SOLN Place 1-2 drops into both eyes 3 (three) times daily as needed (for dryness).    Yes [provider]  rosuvastatin (CRESTOR) 20 MG tablet Take 1 tablet (20 mg total) by mouth daily. 03/21/19  Yes Lelon Perla, MD  SANTYL ointment Apply 1 application topically daily. 05/22/20  Yes [provider]  tacrolimus (PROGRAF) 1 MG capsule Take 3 mg by mouth 2 (two) times daily.    Yes [provider]  torsemide (DEMADEX) 100 MG tablet TAKE 1 TABLET BY MOUTH EVERY DAY Patient taking differently: Take 100 mg by mouth daily.  06/03/20  Yes Plotnikov, [name], MD  warfarin (COUMADIN) 5 MG tablet TAKE 1/2 TO 1 TABLET BY MOUTH DAILY AS DIRECTED BY COUMADIN CLINIC Patient taking differently: Take 2.5-5 mg by mouth See admin instructions. Take 2.5 mg every day except Thursday take 5 mg at bedtime 11/16/19  Yes Crenshaw, Denice Bors, MD  amoxicillin (AMOXIL) 500 MG capsule TAKE 4 CAPSULES BY MOUTH 1 HOUR PRIOR TO DENTAL WORK Patient not taking: Reported on 06/03/2020 03/05/20   Plotnikov, [name], MD  clobetasol ointment (TEMOVATE) 6.14 % Apply 1 application topically 2 (two) times daily. Patient not taking: Reported on 05/26/2020 01/31/20   Janith Lima, MD  Insulin Pen Needle (B-D UF III MINI PEN NEEDLES) 31G X 5 MM MISC USE TO ADMINISTER INSULIN FOUR TIMES A DAY DX E11.9 11/11/17   Plotnikov, [name], MD  [facility] DELICA LANCETS 70L MISC Use to check blood sugars three times a day DX E11.9 10/12/17   Plotnikov, [name], MD  ONETOUCH VERIO test strip USE TO TEST 3 TIMES DAILY. DX E11.9 11/27/19   Plotnikov, [name], MD  triamcinolone cream (KENALOG) 0.1 % Apply 1 application topically 4 (four) times daily. Patient not taking: Reported on 05/26/2020 12/05/19   Plotnikov, [name], MD   Allergies  Allergen Reactions  . Ibuprofen Nausea And Vomiting  . Sulfamethoxazole-Trimethoprim Itching, Swelling and Rash    Swelling of the face  . Sulfonamide Derivatives  Itching, Swelling and Rash    Swelling of the face  . Tape Rash    Paper tape is ok  . Tramadol Nausea And Vomiting  . Doxycycline Nausea Only  . Hydrocil [Psyllium] Nausea And Vomiting  . Bactrim Itching, Swelling and Rash  . Red Dye Itching and Rash   Review of Systems  Unable to obtain due to mental status  Physical Exam  General: Alert, awake, in no acute distress.  HEENT: No bruits, no goiter, no JVD Heart: Regular rate and rhythm. No murmur appreciated. Lungs: Good air movement, clear Abdomen: Soft, nontender, nondistended, positive bowel sounds.  Skin: Warm and dry Neuro: Does not really follow commands reliably   Vital Signs: BP (!) 158/68   Pulse 86   Temp 98 F (36.7 C) (Oral)   Resp 18   Ht 5' 1"  (1.549 m)   Wt 85.1 kg   SpO2 100%   BMI 35.45 kg/m  Pain Scale: 0-10   Pain Score: 8    SpO2: SpO2: 100 % O2 Device:SpO2: 100 % O2 Flow Rate: .O2 Flow Rate (L/min): 2 L/min  IO: Intake/output summary:   Intake/Output Summary (Last 24 hours) at 06/05/2020 1040 Last data filed at 06/05/2020 0600 Gross per 24 hour  Intake 400 ml  Output 1150 ml  Net -750 ml    LBM: Last BM Date:  (PTA) Baseline Weight: Weight: 82 kg Most recent weight: Weight: 85.1 kg     Palliative Assessment/Data:   Flowsheet Rows     Most Recent Value  Intake Tab  Referral Department Hospitalist  Unit at Time of Referral Med/Surg Unit  Clinical Assessment  Psychosocial & Spiritual Assessment  Palliative Care Outcomes      Time In: 6754 Time Out: 4920 Time Total: 40 minutes Greater than 50%  of this time was spent counseling and coordinating care related to the above assessment and plan.  Signed by: Micheline Rough, MD   Please contact Palliative Medicine Team phone at [phone] for questions and concerns.  For individual provider: See Shea Evans

## 2020-06-05 NOTE — Progress Notes (Signed)
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 Apparently per EMS, the patient originally had called out for a nosebleed but upon arrival the patient was found to be febrile, tachypneic and tachycardic with difficulty clearing secretions.   Assessment & Plan:   Principal Problem:   Sepsis ([place]) Active Problems:   Type 2 diabetes mellitus with chronic kidney disease, with long-term current use of insulin (HCC)   Hyperlipidemia LDL goal <70   Renal transplant recipient   S/P aortic valve replacement with bioprosthetic valve and maze procedure   Acute on chronic diastolic CHF (congestive heart failure) (HCC)   History of stroke   Venous stasis ulcers of both lower extremities (HCC)   ESRD (end stage renal disease) (HCC)   Atrial fibrillation with rapid ventricular response ([place])   Pulmonary hypertension, unspecified ([place])   1. Sepsis without septic shock, unknown source but suspected from LLE purulent cellulitis and concern for possible endocarditis a. Presented febrile, tachycardic, tachypneic, leukocytosis b. Chronic bilateral lower extremity wounds, with concerns for active cellulitis c. Has a history of aortic stenosis s/p AVR with a heart failure exacerbation -> echo reviewed with no mention of vegetation d. Blood cultures neg e. Holding IV fluids due to heart failure f. Was on empiric vancomycin and meropenem, narrowed to ancef for moderate  nonpurulent cellulitis g. Palliative care consulted at time of presentation for GOC h. Pt complained of LLE pain. CT LLE ordered and reviewed. Findings of open ulceration with celluliits and myofasciitis. Cont on abx  i. Chart reviewed. Pt has hx of non-operative peripheral vascular disease with non-healing wounds. Per Vascular Surgeon, if wound pain becomes too unbearable, then amputation would be the next consideration. Will discuss with pt  2. Atrial fibrillation with RVR with History of MAZE procedure a. On Coumadin and beta-blocker outpatient b. INR 1.4 on most recent check c. Was initially placed on cardizem gtt, now transitioned to metoprolol per Cardiology, rate controlled. Appreciate assistance by Cardiology d. On Heparin drip at this time, consider resuming coumadin when OK with Cardiology  3. Acute on chronic heart failure exacerbation, suspect diastolic a. Echo 12/18/930: EF 60 to 65% b. Received Lasix 80 mg IV x1 c. Likely exacerbated by A. fib and possible endocarditis d. Cardiology following, appreciate assistance  4. Acute on chronic hypoxemic respiratory failure likely from CHF a. Initially on room air then SpO2 dropped to 84%, currently on 2 LPM b. Has intermittent O2 requirement at baseline c. pO2 27 on VBG d. Continued on 2LNC. Pt is continued on lasix 80mg  IV BID e. Recheck bmet in AM  5. Abdominal pain in the setting of recent abdominal aortogram for chronic wounds a. CT abdomen pelvis without contrast ordered - new body wall edema otherwise no acute findings  6. ESRD s/p renal transplant, now CKD IV on immunosuppression a. Nephrology was reportedly consulted at time of presentation  7. Severe aortic stenosis s/p AVR a. Was on coumadin prior to admit, now on heparin gtt b. INR most recently 1.4  c. Cardiology consulted, appreciate assistance  8. Hypertension a. Now off cardizem gtt and on PO metoprolol b. Cont on norvasc c. BP currently  stable  9. Hyperlipidemia a. Cont diet as tolerate  10. CAD s/p stent a. On coumadin and statin prior to admit b. Currently on heparin gtt per above  11. Embolic CVA with residual expressive aphasia a. Subtherapeutic INR -> CT head -> prior infarct at the junction of the left temporal parietal and occipital lobes, no acute infarct b. Passed SLP eval  12. Diabetes a. Pt had been continued on Lantus 20 u daily with sliding scale b. Will decrease to 16 units as pt is noted to be hypoglycemic recently  13. Chronic bilateral lower extremity wounds a. WOC eval and recs noted   DVT prophylaxis: Heparin gtt Code Status: Full Family Communication: Pt in room, family not at bedside  Status is: Inpatient  Remains inpatient appropriate because:Unsafe d/c plan and IV treatments appropriate due to intensity of illness or inability to take PO   Dispo: The patient is from: Home              Anticipated d/c is to: Pending PT/OT eval              Anticipated d/c date is: 2 days              Patient currently is not medically stable to d/c.    Consultants:   Cardiology  Procedures:     Antimicrobials: Anti-infectives (From admission, onward)   Start     Dose/Rate Route Frequency Ordered Stop   06/05/20 1400  ceFAZolin (ANCEF) IVPB 1 g/50 mL premix  Status:  Discontinued        1 g 100 mL/hr over 30 Minutes Intravenous Every 8 hours 06/05/20 0931 06/05/20 1006   06/05/20 1100  ceFAZolin (ANCEF) IVPB 1 g/50 mL premix        1 g 100 mL/hr over 30 Minutes Intravenous Every 12 hours 06/05/20 1006     06/04/20 2200  cefTRIAXone (ROCEPHIN) 2 g in sodium chloride 0.9 % 100 mL IVPB  Status:  Discontinued        2 g 200 mL/hr over 30 Minutes Intravenous Every 24 hours 06/04/20 1315 06/05/20 0931   06/04/20 1415  linezolid (ZYVOX) IVPB 600 mg  Status:  Discontinued        600 mg 300 mL/hr over 60 Minutes Intravenous Every 12 hours 06/04/20 1315 06/05/20 0931   06/04/20 1415   metroNIDAZOLE (FLAGYL) tablet 500 mg  Status:  Discontinued        500 mg Oral Every 8 hours 06/04/20 1315 06/05/20 0931   06/03/20 1800  meropenem (MERREM) 500 mg in sodium chloride 0.9 % 100 mL IVPB  Status:  Discontinued        500 mg 200 mL/hr over 30 Minutes Intravenous Every 12 hours 06/03/20 0907 06/04/20 1315   06/03/20 0909  vancomycin variable dose per unstable renal function (pharmacist dosing)  Status:  Discontinued         Does not apply See admin instructions 06/03/20 0909 06/04/20 1315   06/03/20 0430  ceFEPIme (MAXIPIME) 2 g in sodium chloride 0.9 % 100 mL IVPB        2 g 200 mL/hr over 30 Minutes Intravenous  Once 06/03/20 0423 06/03/20 0617   06/03/20 0430  piperacillin-tazobactam (ZOSYN) IVPB 3.375 g        3.375 g 12.5 mL/hr over 240 Minutes Intravenous  Once  06/03/20 0423 06/03/20 0909   06/03/20 0430  vancomycin (VANCOCIN) IVPB 1000 mg/200 mL premix        1,000 mg 200 mL/hr over 60 Minutes Intravenous  Once 06/03/20 0423 06/03/20 0617      Subjective: Complains of marked LLE pain  Objective: Vitals:   06/04/20 2315 06/05/20 0346 06/05/20 0527 06/05/20 1427  BP: 108/86 (!) 158/68  (!) 155/84  Pulse: 94 86  94  Resp: 18 18  16   Temp: 97.9 F (36.6 C) 98 F (36.7 C)  98.8 F (37.1 C)  TempSrc:  Oral    SpO2: 98% 100%  97%  Weight:   85.1 kg   Height:        Intake/Output Summary (Last 24 hours) at 06/05/2020 1548 Last data filed at 06/05/2020 1500 Gross per 24 hour  Intake 804 ml  Output 1150 ml  Net -346 ml   Filed Weights   06/04/20 0424 06/04/20 0852 06/05/20 0527  Weight: 82.5 kg 85 kg 85.1 kg    Examination: General exam: Awake, laying in bed, in nad Respiratory system: Normal respiratory effort, no wheezing Cardiovascular system: regular rate, s1, s2 Gastrointestinal system: Soft, nondistended, positive BS Central nervous system: CN2-12 grossly intact, strength intact Extremities: Perfused, no clubbing, marked LLE pain Skin: Normal  skin turgor, no notable skin lesions seen Psychiatry: Mood normal // no visual hallucinations   Data Reviewed: I have personally reviewed following labs and imaging studies  CBC: Recent Labs  Lab 05/30/20 0624 06/03/20 0407 06/04/20 0423 06/05/20 0034  WBC  --  14.2* 16.0* 14.5*  NEUTROABS  --  12.3*  --   --   HGB 15.3* 12.5 12.0 12.4  HCT 45.0 41.7 39.5 42.2  MCV  --  80.0 79.6* 81.3  PLT  --  199 168 876   Basic Metabolic Panel: Recent Labs  Lab 05/30/20 0624 06/03/20 0407 06/04/20 0423 06/05/20 0034  NA 138 141 138 136  K 4.1 4.6 3.8 5.9*  CL 95* 100 101 95*  CO2  --  30 28 28   GLUCOSE 400* 239* 113* 123*  BUN 75* 61* 52* 46*  CREATININE 3.20* 2.92* 2.58* 2.65*  CALCIUM  --  10.2 9.7 9.1   GFR: Estimated Creatinine Clearance: 17.9 mL/min (A) (by C-G formula based on SCr of 2.65 mg/dL (H)). Liver Function Tests: Recent Labs  Lab 06/03/20 0407  AST 23  ALT 15  ALKPHOS 136*  BILITOT 1.1  PROT 7.2  ALBUMIN 2.8*   No results for input(s): LIPASE, AMYLASE in the last 168 hours. No results for input(s): AMMONIA in the last 168 hours. Coagulation Profile: Recent Labs  Lab 05/30/20 0532 06/03/20 0407 06/04/20 0423  INR 1.4* 1.3* 1.4*   Cardiac Enzymes: Recent Labs  Lab 06/05/20 1159  CKTOTAL 98   BNP (last 3 results) No results for input(s): PROBNP in the last 8760 hours. HbA1C: Recent Labs    06/03/20 0407  HGBA1C 9.2*   CBG: Recent Labs  Lab 06/04/20 1752 06/04/20 1833 06/04/20 2141 06/05/20 0716 06/05/20 1424  GLUCAP 49* 54* 90 101* 121*   Lipid Profile: No results for input(s): CHOL, HDL, LDLCALC, TRIG, CHOLHDL, LDLDIRECT in the last 72 hours. Thyroid Function Tests: No results for input(s): TSH, T4TOTAL, FREET4, T3FREE, THYROIDAB in the last 72 hours. Anemia Panel: No results for input(s): VITAMINB12, FOLATE, FERRITIN, TIBC, IRON, RETICCTPCT in the last 72 hours. Sepsis Labs: Recent Labs  Lab 06/03/20 0407 06/03/20 0619   LATICACIDVEN 1.7 1.5  Recent Results (from the past 240 hour(s))  SARS CORONAVIRUS 2 (TAT 6-24 HRS) Nasopharyngeal Nasopharyngeal Swab     Status: None   Collection Time: 05/29/20 11:16 AM   Specimen: Nasopharyngeal Swab  Result Value Ref Range Status   SARS Coronavirus 2 NEGATIVE NEGATIVE Final    Comment: (NOTE) SARS-CoV-2 target nucleic acids are NOT DETECTED.  The SARS-CoV-2 RNA is generally detectable in upper and lower respiratory specimens during the acute phase of infection. Negative results do not preclude SARS-CoV-2 infection, do not rule out co-infections with other pathogens, and should not be used as the sole basis for treatment or other patient management decisions. Negative results must be combined with clinical observations, patient history, and epidemiological information. The expected result is Negative.  Fact Sheet for Patients: SugarRoll.be  Fact Sheet for Healthcare Providers: https://www.woods-mathews.com/  This test is not yet approved or cleared by the [place] FDA and  has been authorized for detection and/or diagnosis of SARS-CoV-2 by FDA under an Emergency Use Authorization (EUA). This EUA will remain  in effect (meaning this test can be used) for the duration of the COVID-19 declaration under Se ction 564(b)(1) of the Act, 21 U.S.C. section 360bbb-3(b)(1), unless the authorization is terminated or revoked sooner.  Performed at [facility], [place] [address] [place], [place] [address]   Culture, blood (Routine x 2)     Status: None (Preliminary result)   Collection Time: 06/03/20  4:07 AM   Specimen: BLOOD  Result Value Ref Range Status   Specimen Description   Final    BLOOD RIGHT ANTECUBITAL Performed at [place] [address], [place], [place] [address]    Special Requests   Final    BOTTLES DRAWN AEROBIC AND ANAEROBIC Blood Culture adequate volume Performed at  [place] [address], [place], [place] [address]    Culture   Final    NO GROWTH 2 DAYS Performed at [place] [address] [place], [place] [address]    Report Status PENDING  Incomplete  Culture, blood (Routine x 2)     Status: None (Preliminary result)   Collection Time: 06/03/20  4:07 AM   Specimen: BLOOD RIGHT HAND  Result Value Ref Range Status   Specimen Description   Final    BLOOD RIGHT HAND Performed at [place] [address] [place], [place] [address]    Special Requests   Final    BOTTLES DRAWN AEROBIC AND ANAEROBIC Blood Culture results may not be optimal due to an inadequate volume of blood received in culture bottles Performed at [place] [address], [place], [place] [address]    Culture   Final    NO GROWTH 2 DAYS Performed at [place] [address], [place], [place] [address]    Report Status PENDING  Incomplete  Urine culture     Status: Abnormal   Collection Time: 06/03/20  4:29 AM   Specimen: In/Out Cath Urine  Result Value Ref Range Status   Specimen Description   Final    IN/OUT CATH URINE Performed at [place] [address], [place], [place] [address]    Special Requests   Final    NONE Performed at [facility], [place] [address], [place], [place] [address]    Culture 10,000 COLONIES/mL PROTEUS MIRABILIS (A)  Final   Report Status 06/05/2020 FINAL  Final   Organism  ID, Bacteria PROTEUS MIRABILIS (A)  Final      Susceptibility   Proteus mirabilis - MIC*    AMPICILLIN <=2 SENSITIVE Sensitive     CEFAZOLIN <=4 SENSITIVE Sensitive     CEFEPIME <=0.12 SENSITIVE Sensitive     CEFTRIAXONE <=0.25 SENSITIVE Sensitive     CIPROFLOXACIN <=0.25 SENSITIVE Sensitive     GENTAMICIN <=1 SENSITIVE Sensitive     IMIPENEM 8 INTERMEDIATE Intermediate     NITROFURANTOIN 128 RESISTANT Resistant     TRIMETH/SULFA <=20  SENSITIVE Sensitive     AMPICILLIN/SULBACTAM <=2 SENSITIVE Sensitive     PIP/TAZO <=4 SENSITIVE Sensitive     * 10,000 COLONIES/mL PROTEUS MIRABILIS  Resp Panel by RT PCR (RSV, Flu A&B, Covid) - Nasopharyngeal Swab     Status: None   Collection Time: 06/03/20  8:14 AM   Specimen: Nasopharyngeal Swab  Result Value Ref Range Status   SARS Coronavirus 2 by RT PCR NEGATIVE NEGATIVE Final    Comment: (NOTE) SARS-CoV-2 target nucleic acids are NOT DETECTED.  The SARS-CoV-2 RNA is generally detectable in upper respiratoy specimens during the acute phase of infection. The lowest concentration of SARS-CoV-2 viral copies this assay can detect is 131 copies/mL. A negative result does not preclude SARS-Cov-2 infection and should not be used as the sole basis for treatment or other patient management decisions. A negative result may occur with  improper specimen collection/handling, submission of specimen other than nasopharyngeal swab, presence of viral mutation(s) within the areas targeted by this assay, and inadequate number of viral copies (<131 copies/mL). A negative result must be combined with clinical observations, patient history, and epidemiological information. The expected result is Negative.  Fact Sheet for Patients:  PinkCheek.be  Fact Sheet for Healthcare Providers:  GravelBags.it  This test is no t yet approved or cleared by the [place] FDA and  has been authorized for detection and/or diagnosis of SARS-CoV-2 by FDA under an Emergency Use Authorization (EUA). This EUA will remain  in effect (meaning this test can be used) for the duration of the COVID-19 declaration under Section 564(b)(1) of the Act, 21 U.S.C. section 360bbb-3(b)(1), unless the authorization is terminated or revoked sooner.     Influenza A by PCR NEGATIVE NEGATIVE Final   Influenza B by PCR NEGATIVE NEGATIVE Final    Comment: (NOTE) The  Xpert Xpress SARS-CoV-2/FLU/RSV assay is intended as an aid in  the diagnosis of influenza from Nasopharyngeal swab specimens and  should not be used as a sole basis for treatment. Nasal washings and  aspirates are unacceptable for Xpert Xpress SARS-CoV-2/FLU/RSV  testing.  Fact Sheet for Patients: PinkCheek.be  Fact Sheet for Healthcare Providers: GravelBags.it  This test is not yet approved or cleared by the [place] FDA and  has been authorized for detection and/or diagnosis of SARS-CoV-2 by  FDA under an Emergency Use Authorization (EUA). This EUA will remain  in effect (meaning this test can be used) for the duration of the  Covid-19 declaration under Section 564(b)(1) of the Act, 21  U.S.C. section 360bbb-3(b)(1), unless the authorization is  terminated or revoked.    Respiratory Syncytial Virus by PCR NEGATIVE NEGATIVE Final    Comment: (NOTE) Fact Sheet for Patients: PinkCheek.be  Fact Sheet for Healthcare Providers: GravelBags.it  This test is not yet approved or cleared by the [place] FDA and  has been authorized for detection and/or diagnosis of SARS-CoV-2 by  FDA under an Emergency Use Authorization (EUA). This EUA will remain  in  effect (meaning this test can be used) for the duration of the  COVID-19 declaration under Section 564(b)(1) of the Act, 21 U.S.C.  section 360bbb-3(b)(1), unless the authorization is terminated or  revoked. Performed at [facility], [place] [address] [place], [place] [address]   MRSA PCR Screening     Status: Abnormal   Collection Time: 06/03/20  2:00 PM   Specimen: Nasal Mucosa; Nasopharyngeal  Result Value Ref Range Status   MRSA by PCR POSITIVE (A) NEGATIVE Final    Comment:        The GeneXpert MRSA Assay (FDA approved for NASAL specimens only), is one component of a comprehensive MRSA  colonization surveillance program. It is not intended to diagnose MRSA infection nor to guide or monitor treatment for MRSA infections. RESULT CALLED TO, READ BACK BY AND VERIFIED WITH: HEAVNER,L. RN @1848  06/03/20 BILLINGSLEY,L Performed at [facility], [place] [address] [place],  [address]      Radiology Studies: CT TIBIA FIBULA LEFT WO CONTRAST  Result Date: 06/05/2020 CLINICAL DATA:  Open wounds involving the right lower extremity and diffuse soft tissue swelling, redness and pain. EXAM: CT OF THE LOWER LEFT EXTREMITY WITHOUT CONTRAST TECHNIQUE: Multidetector CT imaging of the lower left extremity was performed according to the standard protocol. COMPARISON:  None. FINDINGS: There are open wounds noted involving the anterior aspect of the left lower extremity. There is a fairly deep 3.7 cm long soft tissue defect involving the anterior aspect at the mid tibial level. Slightly more inferiorly and medially there is a second lesion measuring 18 mm. Diffuse and fairly marked skin thickening, subcutaneous soft tissue swelling/edema/fluid consistent with cellulitis. I do not see a discrete fluid collection to suggest a drainable soft tissue abscess. Although difficult with CT I believe there is also diffuse myofasciitis without obvious findings for pyomyositis. No findings suspicious for septic arthritis at the knee or ankle joints. Extensive vascular calcifications are noted. IMPRESSION: 1. Open wounds involving the anterior aspect of the left lower extremity with 2 areas of deep ulceration as described above. 2. Diffuse and fairly marked skin thickening, subcutaneous soft tissue swelling/edema/fluid consistent with cellulitis. 3. No discrete fluid collection to suggest a drainable soft tissue abscess. 4. Suspect diffuse myofasciitis without obvious findings for pyomyositis. 5. No findings suspicious for septic arthritis at the knee or ankle joints. 6. Extensive vascular  calcifications. Electronically Signed   By: Marijo Sanes M.D.   On: 06/05/2020 14:47    Scheduled Meds: . amLODipine  5 mg Oral Daily  . Chlorhexidine Gluconate Cloth  6 each Topical Q0600  . collagenase   Topical Daily  . furosemide  80 mg Intravenous Q12H  . insulin aspart  0-9 Units Subcutaneous TID WC  . [START ON 06/06/2020] insulin glargine  16 Units Subcutaneous Daily  . metoprolol tartrate  25 mg Oral BID  . mupirocin ointment  1 application Nasal BID  . sodium chloride flush  3 mL Intravenous Q12H   Continuous Infusions: .  ceFAZolin (ANCEF) IV Stopped (06/05/20 1500)  . heparin 1,500 Units/hr (06/04/20 2231)     LOS: 2 days   Marylu Lund, MD [facility] Pager On Amion  If 7PM-7AM, please contact night-coverage 06/05/2020, 3:48 PM

## 2020-06-05 NOTE — Progress Notes (Signed)
PHARMACY NOTE -  ANTIBIOTIC RENAL DOSE ADJUSTMENT  Patient has been initiated on cefazolin for cellulits. SCr 2.65, estimated CrCl 18 ml/min.  Plan: cefazolin 1 gm IV q12   Eudelia Bunch, Pharm.D 06/05/2020 10:07 AM

## 2020-06-05 NOTE — Progress Notes (Signed)
Results for ISATOU, AGREDANO (MRN [id number]) as of 06/05/2020 10:09  Ref. Range 06/04/2020 11:39 06/04/2020 17:52 06/04/2020 18:33 06/04/2020 21:41 06/05/2020 07:16  Glucose-Capillary Latest Ref Range: 70 - 99 mg/dL 70 49 (L) 54 (L) 90 101 (H)  Noted that blood sugars have been less than 70 mg/dl yesterday.   Recommend decreasing Lantus to 16 units daily if blood sugars continue to be less than 80 mg/dl. Note that Novolog correction scale has not been given for CBGs less than 120 mg/dl.   Will continue to monitor blood sugars while in the hospital.  Harvel Ricks RN BSN CDE Diabetes Coordinator Pager: [phone]  8am-5pm

## 2020-06-05 NOTE — Progress Notes (Addendum)
Progress Note  Patient Name: [name] Date of Encounter: 06/05/2020  [facility] Cardiologist: [name], MD   Subjective   No chest pain  Inpatient Medications    Scheduled Meds: . amLODipine  5 mg Oral Daily  . Chlorhexidine Gluconate Cloth  6 each Topical Q0600  . collagenase   Topical Daily  . furosemide  80 mg Intravenous Q12H  . insulin aspart  0-9 Units Subcutaneous TID WC  . insulin glargine  20 Units Subcutaneous Daily  . metoprolol tartrate  25 mg Oral BID  . mupirocin ointment  1 application Nasal BID  . sodium chloride flush  3 mL Intravenous Q12H   Continuous Infusions: .  ceFAZolin (ANCEF) IV    . heparin 1,500 Units/hr (06/04/20 2231)   PRN Meds: acetaminophen **OR** acetaminophen, HYDROmorphone (DILAUDID) injection, polyethylene glycol   Vital Signs    Vitals:   06/04/20 1940 06/04/20 2315 06/05/20 0346 06/05/20 0527  BP: 140/74 108/86 (!) 158/68   Pulse: 82 94 86   Resp: 17 18 18    Temp: 98.2 F (36.8 C) 97.9 F (36.6 C) 98 F (36.7 C)   TempSrc: Oral  Oral   SpO2: 96% 98% 100%   Weight:    85.1 kg  Height:        Intake/Output Summary (Last 24 hours) at 06/05/2020 1031 Last data filed at 06/05/2020 0600 Gross per 24 hour  Intake 400 ml  Output 1150 ml  Net -750 ml   Last 3 Weights 06/05/2020 06/04/2020 06/04/2020  Weight (lbs) 187 lb 9.8 oz 187 lb 6.3 oz 181 lb 14.1 oz  Weight (kg) 85.1 kg 85 kg 82.5 kg      Telemetry    PAF - Personally Reviewed  ECG    No new - Personally Reviewed  Physical Exam  Exam by Dr. Oval Linsey  GEN: No acute distress.   Neck: No JVD Cardiac: RRR, no murmurs, rubs, or gallops.  Respiratory: Clear to auscultation bilaterally. GI: Soft, nontender, non-distended  MS: + edema; No deformity. Left leg with dressing    Neuro:  Nonfocal  Psych: Normal affect   Labs    High Sensitivity Troponin:   Recent Labs  Lab 06/03/20 0619 06/03/20 0815 06/03/20 1009  TROPONINIHS 161* 151*  174*      Chemistry Recent Labs  Lab 06/03/20 0407 06/04/20 0423 06/05/20 0034  NA 141 138 136  K 4.6 3.8 5.9*  CL 100 101 95*  CO2 30 28 28   GLUCOSE 239* 113* 123*  BUN 61* 52* 46*  CREATININE 2.92* 2.58* 2.65*  CALCIUM 10.2 9.7 9.1  PROT 7.2  --   --   ALBUMIN 2.8*  --   --   AST 23  --   --   ALT 15  --   --   ALKPHOS 136*  --   --   BILITOT 1.1  --   --   GFRNONAA 16* 19* 18*  ANIONGAP 11 9 13      Hematology Recent Labs  Lab 06/03/20 0407 06/04/20 0423 06/05/20 0034  WBC 14.2* 16.0* 14.5*  RBC 5.21* 4.96 5.19*  HGB 12.5 12.0 12.4  HCT 41.7 39.5 42.2  MCV 80.0 79.6* 81.3  MCH 24.0* 24.2* 23.9*  MCHC 30.0 30.4 29.4*  RDW 21.4* 21.0* 21.4*  PLT 199 168 168    BNP Recent Labs  Lab 06/03/20 0619  BNP 1,883.6*     DDimer No results for input(s): DDIMER in the last 168 hours.  Radiology    ECHOCARDIOGRAM COMPLETE  Result Date: 06/03/2020    ECHOCARDIOGRAM REPORT   Patient Name:   [name] Date of Exam: 06/03/2020 Medical Rec #:  [id number]    Height:       60.0 in Accession #:    [id number]   Weight:       180.8 lb Date of Birth:  Jan 16, 1946    BSA:          1.788 m Patient Age:    74 years     BP:           136/96 mmHg Patient Gender: F            HR:           96 bpm. Exam Location:  Inpatient Procedure: 2D Echo, Cardiac Doppler and Color Doppler                               MODIFIED REPORT: This report was modified by Cherlynn Kaiser MD on 06/03/2020 due to revision.  Indications:     I50.33 Acute on chronic diastolic (congestive) heart failure;                  Fever 780.6 / R50.9  History:         Patient has prior history of Echocardiogram examinations, most                  recent 10/02/2019. Previous Myocardial Infarction, COPD and                  Carotid Disease, Aortic Valve Disease, Arrythmias:Atrial                  Fibrillation; Risk Factors:Hypertension, Former Smoker,                  Dyslipidemia, Diabetes and GERD. Sepsis.                   Aortic Valve: 23 mm Edwards bovine valve is present in the                  aortic position. Procedure Date: 04/07/2013.  Sonographer:     Jonelle Sidle Dance Referring Phys:  [id number] Harold Hedge Diagnosing Phys: Cherlynn Kaiser MD IMPRESSIONS  1. Left ventricular ejection fraction, by estimation, is 55 to 60%. The left ventricle has normal function. The left ventricle demonstrates regional wall motion abnormalities (see scoring diagram/findings for description). There is mild left ventricular  hypertrophy. Left ventricular diastolic parameters are indeterminate.  2. Right ventricular systolic function is moderately reduced. The right ventricular size is moderately enlarged. There is severely elevated pulmonary artery systolic pressure. The estimated right ventricular systolic pressure is 50.5 mmHg.  3. Left atrial size was severely dilated.  4. Right atrial size was moderately dilated.  5. Suspect small flail segment on anterior mitral leaflet causing very eccentric posteriorly directed mitral valve regurgitation that appears at least moderate-severe. From short axis view may involve A3 scallop.. The mitral valve is abnormal. Moderate to severe mitral valve regurgitation. Severe mitral annular calcification.  6. Tricuspid valve regurgitation is severe.  7. The aortic valve has been repaired/replaced. Aortic valve regurgitation is not visualized. There is a 23 mm Edwards bovine valve present in the aortic position. Procedure Date: 04/07/2013. Echo findings are consistent with normal structure and function of the aortic valve prosthesis. Aortic valve mean gradient measures  18.2 mmHg.  8. The inferior vena cava is dilated in size with <50% respiratory variability, suggesting right atrial pressure of 15 mmHg. Comparison(s): A prior study was performed on 10/02/19. Prior images reviewed side by side. Mitral regurgitation may not have significantly changed, but is more evident on today's exam. Consider TEE for further  evaluation of mitral valve morphology and severity of regurgitation. No significant change in aortic valve prosthesis. FINDINGS  Left Ventricle: Left ventricular ejection fraction, by estimation, is 55 to 60%. The left ventricle has normal function. The left ventricle demonstrates regional wall motion abnormalities. The left ventricular internal cavity size was normal in size. There is mild left ventricular hypertrophy. Left ventricular diastolic parameters are indeterminate.  LV Wall Scoring: The posterior wall and basal inferior segment are hypokinetic. Right Ventricle: The right ventricular size is moderately enlarged. No increase in right ventricular wall thickness. Right ventricular systolic function is moderately reduced. There is severely elevated pulmonary artery systolic pressure. The tricuspid regurgitant velocity is 3.82 m/s, and with an assumed right atrial pressure of 15 mmHg, the estimated right ventricular systolic pressure is 33.8 mmHg. Left Atrium: Left atrial size was severely dilated. Right Atrium: Right atrial size was moderately dilated. Pericardium: There is no evidence of pericardial effusion. Mitral Valve: Suspect small flail segment on anterior mitral leaflet causing very eccentric posteriorly directed mitral valve regurgitation that appears at least moderate-severe. From short axis view may involve A3 scallop. The mitral valve is abnormal. There is moderate calcification of the mitral valve leaflet(s). Severe mitral annular calcification. Moderate to severe mitral valve regurgitation. Tricuspid Valve: Systolic reversals in hepatic vein Doppler suggest severe TR. The tricuspid valve is normal in structure. Tricuspid valve regurgitation is severe. Aortic Valve: Grossly normal aortic valve prosthesis. Vmax 3 m/s, DVI 0.37. AT 77 msec, EOA 1.52 cm2, iEOA 0.85 cm2/m2. The aortic valve has been repaired/replaced. Aortic valve regurgitation is not visualized. Aortic valve mean gradient measures  18.2 mmHg. Aortic valve peak gradient measures 35.5 mmHg. Aortic valve area, by VTI measures 1.54 cm. There is a 23 mm Edwards bovine valve present in the aortic position. Procedure Date: 04/07/2013. Echo findings are consistent with normal structure and function of the aortic valve prosthesis. Pulmonic Valve: The pulmonic valve was grossly normal. Pulmonic valve regurgitation is mild to moderate. Aorta: The aortic root was not well visualized. Venous: The inferior vena cava is dilated in size with less than 50% respiratory variability, suggesting right atrial pressure of 15 mmHg. IAS/Shunts: No atrial level shunt detected by color flow Doppler.  LEFT VENTRICLE PLAX 2D LVIDd:         4.90 cm LVIDs:         4.00 cm LV PW:         1.50 cm LV IVS:        1.00 cm LVOT diam:     2.30 cm LV SV:         71 LV SV Index:   40 LVOT Area:     4.15 cm  RIGHT VENTRICLE          IVC RV Basal diam:  4.40 cm  IVC diam: 2.80 cm RV Mid diam:    3.00 cm TAPSE (M-mode): 1.1 cm LEFT ATRIUM             Index       RIGHT ATRIUM           Index LA diam:        4.30 cm 2.40  cm/m  RA Area:     22.90 cm LA Vol (A2C):   92.8 ml 51.90 ml/m RA Volume:   74.80 ml  41.83 ml/m LA Vol (A4C):   76.7 ml 42.89 ml/m LA Biplane Vol: 84.0 ml 46.98 ml/m  AORTIC VALVE AV Area (Vmax):    1.25 cm AV Area (Vmean):   1.19 cm AV Area (VTI):     1.54 cm AV Vmax:           297.95 cm/s AV Vmean:          202.608 cm/s AV VTI:            0.465 m AV Peak Grad:      35.5 mmHg AV Mean Grad:      18.2 mmHg LVOT Vmax:         89.55 cm/s LVOT Vmean:        58.100 cm/s LVOT VTI:          0.172 m LVOT/AV VTI ratio: 0.37 MITRAL VALVE                TRICUSPID VALVE MV Area (PHT): 3.60 cm     TR Peak grad:   58.4 mmHg MV Decel Time: 211 msec     TR Vmax:        382.00 cm/s MV E velocity: 152.50 cm/s                             SHUNTS                             Systemic VTI:  0.17 m                             Systemic Diam: 2.30 cm Cherlynn Kaiser MD Electronically  signed by Cherlynn Kaiser MD Signature Date/Time: 06/03/2020/3:01:49 PM    Final (Updated)     Cardiac Studies   06/03/20 Echo  IMPRESSIONS  1. Left ventricular ejection fraction, by estimation, is 55 to 60%. The  left ventricle has normal function. The left ventricle demonstrates  regional wall motion abnormalities (see scoring diagram/findings for  description). There is mild left ventricular  hypertrophy. Left ventricular diastolic parameters are indeterminate.  2. Right ventricular systolic function is moderately reduced. The right  ventricular size is moderately enlarged. There is severely elevated  pulmonary artery systolic pressure. The estimated right ventricular  systolic pressure is 67.1 mmHg.  3. Left atrial size was severely dilated.  4. Right atrial size was moderately dilated.  5. Suspect small flail segment on anterior mitral leaflet causing very  eccentric posteriorly directed mitral valve regurgitation that appears at  least moderate-severe. From short axis view may involve A3 scallop.. The  mitral valve is abnormal. Moderate  to severe mitral valve regurgitation. Severe mitral annular calcification.  6. Tricuspid valve regurgitation is severe.  7. The aortic valve has been repaired/replaced. Aortic valve  regurgitation is not visualized. There is a 23 mm Edwards bovine valve  present in the aortic position. Procedure Date: 04/07/2013. Echo findings  are consistent with normal structure and  function of the aortic valve prosthesis. Aortic valve mean gradient  measures 18.2 mmHg.  8. The inferior vena cava is dilated in size with <50% respiratory  variability, suggesting right atrial pressure of 15 mmHg.   Comparison(s): A prior study was performed  on 10/02/19. Prior images  reviewed side by side. Mitral regurgitation may not have significantly  changed, but is more evident on today's exam. Consider TEE for further  evaluation of mitral valve morphology  and  severity of regurgitation. No significant change in aortic valve  prosthesis.   FINDINGS  Left Ventricle: Left ventricular ejection fraction, by estimation, is 55  to 60%. The left ventricle has normal function. The left ventricle  demonstrates regional wall motion abnormalities. The left ventricular  internal cavity size was normal in size.  There is mild left ventricular hypertrophy. Left ventricular diastolic  parameters are indeterminate.     LV Wall Scoring:  The posterior wall and basal inferior segment are hypokinetic.   Right Ventricle: The right ventricular size is moderately enlarged. No  increase in right ventricular wall thickness. Right ventricular systolic  function is moderately reduced. There is severely elevated pulmonary  artery systolic pressure. The tricuspid  regurgitant velocity is 3.82 m/s, and with an assumed right atrial  pressure of 15 mmHg, the estimated right ventricular systolic pressure is  62.9 mmHg.   Left Atrium: Left atrial size was severely dilated.   Right Atrium: Right atrial size was moderately dilated.   Pericardium: There is no evidence of pericardial effusion.   Mitral Valve: Suspect small flail segment on anterior mitral leaflet  causing very eccentric posteriorly directed mitral valve regurgitation  that appears at least moderate-severe. From short axis view may involve A3  scallop. The mitral valve is abnormal.  There is moderate calcification of the mitral valve leaflet(s). Severe  mitral annular calcification. Moderate to severe mitral valve  regurgitation.   Tricuspid Valve: Systolic reversals in hepatic vein Doppler suggest severe  TR. The tricuspid valve is normal in structure. Tricuspid valve  regurgitation is severe.   Aortic Valve: Grossly normal aortic valve prosthesis. Vmax 3 m/s, DVI  0.37. AT 77 msec, EOA 1.52 cm2, iEOA 0.85 cm2/m2. The aortic valve has  been repaired/replaced. Aortic valve regurgitation is not  visualized.  Aortic valve mean gradient measures 18.2  mmHg. Aortic valve peak gradient measures 35.5 mmHg. Aortic valve area, by  VTI measures 1.54 cm. There is a 23 mm Edwards bovine valve present in  the aortic position. Procedure Date: 04/07/2013. Echo findings are  consistent with normal structure and  function of the aortic valve prosthesis.   Pulmonic Valve: The pulmonic valve was grossly normal. Pulmonic valve  regurgitation is mild to moderate.   Aorta: The aortic root was not well visualized.   Venous: The inferior vena cava is dilated in size with less than 50%  respiratory variability, suggesting right atrial pressure of 15 mmHg.   IAS/Shunts: No atrial level shunt detected by color flow Doppler.   ECHO:10/02/2019 1. Left ventricular ejection fraction, by estimation, is 60 to 65%. The  left ventricle has low normal function. The left ventricle has no regional  wall motion abnormalities. There is mild left ventricular hypertrophy.  Left ventricular diastolic function  could not be evaluated. Left ventricular diastolic function could not be  evaluated. Elevated left ventricular end-diastolic pressure.  2. Right ventricular systolic function is normal. The right ventricular  size is normal. There is severely elevated pulmonary artery systolic  pressure. The estimated right ventricular systolic pressure is 52.8 mmHg.  3. Left atrial size was moderately dilated.  4. Right atrial size was mildly dilated.  5. The mitral valve is abnormal. Mild to moderate mitral valve  regurgitation.  6. Tricuspid valve regurgitation is mild  to moderate.  7. The aortic valve has been repaired/replaced. Aortic valve  regurgitation is trivial. There is a 23 mm Edwards bovine valve present in  the aortic position. Procedure Date: 04/12/2013. Aortic valve area, by VTI  measures 1.66 cm. Aortic valve mean  gradient measures 13.0 mmHg. Aortic valve Vmax measures 2.72 m/s.  8. The  inferior vena cava is dilated in size with <50% respiratory  variability, suggesting right atrial pressure of 15 mmHg.   Comparison(s): Changes from prior study are noted. 12/20/2018: LVEF 60-65%,  RVSP 44 mmHg.   PVCATH:05/30/2020 Operative findings: 1. Bilateral flush superficial femoral artery occlusions  2. One-vessel very diseased calcified peroneal runoff bilaterally Patient overall is a very poor operative candidate. 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 She is not a candidate for percutaneous intervention because she has a flush SFA occlusion on both sides. Her only options at this point would be continued local wound care to see if the wounds will heal. These have not healed in several months so if eventually she decides the pain from the wounds or the wound burden is too much for her she would be a candidate for bilateral or unilateral above-knee amputation. I discussed all of this with the patient and her husband today. She will follow up with me on an as-needed basis.  Patient Profile     74 y.o. 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   Assessment & Plan    1. Atrial fib, RVR -Duration unknown -Prior to admission she was on metoprolol 12.5 mg twice daily. -Her Coumadin was held for the PV procedure, and she is not yet therapeutic, she has been started on heparin. -Her heart rate has improved with Cardizem now on lopressor 25 BID and rate  controlled.   2. Acute on chronic diastolic CHF -Prior to admission she was on Demadex 100 mg/day -neg 2744 since admit and wt actually stable - lasix 80 mg every 12 hours.  3. CKD IV -Her creatinine has been greater than2.47for most of the last 6 months. -Follow creatinine closely with diuresis, may need Nephrology input today 2.65   4. Sepsis -She got a dose of cefepime and vancomycin in the ER. She also got Zosyn. -Antibiotics per IM, blood cultures and urine cultures pending  5.  S/p AVR with bioprosthetic stable on echo with valve 23 mm Edwards bovine valve present in the aortic position mild regurg.. Procedure Date: 04/12/2013. Aortic valve area, by VTI measures 1.66 cm. Aortic valve mean gradient measures 13.0 mmHg. Aortic valve Vmax measures 2.72 m/s.   6.  Hyperkalemia with K+ 5.9 not on K+            For questions or updates, please contact [place] Please consult [email or web address] for contact info under        Signed, Dillie Burandt C. Oval Linsey, MD, [name]  06/05/2020, 10:31 AM

## 2020-06-05 NOTE — Evaluation (Signed)
Occupational Therapy Evaluation Patient Details Name: [name] MRN: [id number] DOB: 05-Dec-1945 Today's Date: 06/05/2020    History of Present Illness 74 y.o. female  with past medical history of CAD status post stent, severe aortic stenosis status post AVR, hypertension, hyperlipidemia, OSA, COPD with intermittent O2 requirement, HFpEF, atrial fibrillation status post maze on Coumadin, type 2 diabetes, end-stage renal disease status post transplant, CKD 4, adenocarcinoma of the lung, embolic CVA with residual aphasia, bilateral lower extremity nonhealing wounds and recent abdominal aortogram with out actionable findings (would likely need eventual AKA) admitted on 06/03/2020 with sepsis, concern for endocarditis, A. fib and worsening renal failure.  Palliative consulted for goals of care.   Clinical Impression   Pt. Was not able to express needs. Pt. Seems to be in pain with movement to LE. Attempted to have Pt. Sit EOB and she was not able to tolerate it. Pt. Was setup in bed for self feeding. Pt. Required Max A with task. Unknown level of support at home. Pt. To be seen for skilled OT on a trail basis to see if she will benefit from services.    Follow Up Recommendations  SNF;Supervision/Assistance - 24 hour (unknown level of support at home.)    Equipment Recommendations       Recommendations for Other Services       Precautions / Restrictions Precautions Precautions: Fall Precaution Comments: AMS Restrictions Weight Bearing Restrictions: No      Mobility Bed Mobility Overal bed mobility: Needs Assistance Bed Mobility: Supine to Sit     Supine to sit: HOB elevated;+2 for physical assistance;Total assist     General bed mobility comments: Pt. was not able to tolerate EOB sitting.     Transfers                 General transfer comment: unable    Balance Overall balance assessment: Needs assistance Sitting-balance support: Single extremity supported Sitting  balance-Leahy Scale: Zero Sitting balance - Comments: Pt unable to tolerate sitting EOB due to pain level and decreased cognition and aphasia as she was unable to communicate her needs.                                   ADL either performed or assessed with clinical judgement   ADL Overall ADL's : Needs assistance/impaired Eating/Feeding: Maximal assistance;Bed level Eating/Feeding Details (indicate cue type and reason): hob raised Grooming: Wash/dry hands;Wash/dry face;Maximal assistance;Bed level Grooming Details (indicate cue type and reason): head of bed raised Upper Body Bathing: Total assistance;Bed level   Lower Body Bathing: Total assistance;Bed level   Upper Body Dressing : Total assistance;Bed level   Lower Body Dressing: Total assistance;Bed level               Functional mobility during ADLs: Total assistance;+2 for physical assistance (bed mobility, Pt. not able to tolerate sitting EOB.) General ADL Comments: Pt. with decreased ability to follow directions for ADLs. Pt. needed hand over hand assist for self feeding and grooming.      Vision Baseline Vision/History:  (unknown)       Perception     Praxis      Pertinent Vitals/Pain Pain Assessment: Faces Faces Pain Scale: Hurts whole lot Pain Location: legs Pain Descriptors / Indicators: Grimacing;Guarding;Moaning Pain Intervention(s): Limited activity within patient's tolerance;Monitored during session;Repositioned     Hand Dominance Right   Extremity/Trunk Assessment Upper Extremity Assessment Upper Extremity Assessment:  Difficult to assess due to impaired cognition   Lower Extremity Assessment Lower Extremity Assessment: Defer to PT evaluation       Communication Communication Communication: Expressive difficulties   Cognition Arousal/Alertness: Lethargic Behavior During Therapy: Restless Overall Cognitive Status: No family/caregiver present to determine baseline cognitive  functioning                                 General Comments: H/o aphasia, able to state her name, but not oriented to location and situation. Unable to follow one step commands and verbalize needs.   General Comments  erythemia bil LE, edema, very sensitive to light tough in LE    Exercises     Shoulder Instructions      Home Living Family/patient expects to be discharged to:: Unsure Living Arrangements: Spouse/significant other                               Additional Comments: Pt was unable to provide a PLOF due to decreased cognition and spous not present at time of evaluation.      Prior Functioning/Environment          Comments: unknown        OT Problem List: Decreased activity tolerance;Impaired balance (sitting and/or standing);Decreased cognition      OT Treatment/Interventions: Self-care/ADL training;Therapeutic activities;Patient/family education    OT Goals(Current goals can be found in the care plan section) Acute Rehab OT Goals Patient Stated Goal: none stated OT Goal Formulation: Patient unable to participate in goal setting Time For Goal Achievement: 06/19/20 Potential to Achieve Goals: Fair ADL Goals Pt Will Perform Eating: with set-up;sitting;bed level;with supervision Pt Will Perform Grooming: with set-up;with supervision;sitting;bed level  OT Frequency: Min 2X/week   Barriers to D/C:  (unknown level of support. unknown patient baseline for adls)          Co-evaluation PT/OT/SLP Co-Evaluation/Treatment: Yes Reason for Co-Treatment: Complexity of the patient's impairments (multi-system involvement);Necessary to address cognition/behavior during functional activity PT goals addressed during session: Mobility/safety with mobility;Strengthening/ROM OT goals addressed during session: ADL's and self-care      AM-PAC OT "6 Clicks" Daily Activity     Outcome Measure Help from another person eating meals?: Total Help  from another person taking care of personal grooming?: Total Help from another person toileting, which includes using toliet, bedpan, or urinal?: Total Help from another person bathing (including washing, rinsing, drying)?: Total Help from another person to put on and taking off regular upper body clothing?: Total Help from another person to put on and taking off regular lower body clothing?: Total 6 Click Score: 6   End of Session Nurse Communication:  (ok therapy)  Activity Tolerance: Patient limited by pain Patient left: in bed;with bed alarm set;with call bell/phone within reach  OT Visit Diagnosis: Feeding difficulties (R63.3)                Time: 1310-1340 OT Time Calculation (min): 30 min Charges:  OT General Charges $OT Visit: 1 Visit OT Evaluation $OT Eval Moderate Complexity: 1 Mod  Reece Packer OT/L   [place] 06/05/2020, 2:11 PM

## 2020-06-05 NOTE — Progress Notes (Signed)
[place] for heparin Indication: hx bioprosthetic AVR, stroke, afib  Allergies  Allergen Reactions  . Ibuprofen Nausea And Vomiting  . Sulfamethoxazole-Trimethoprim Itching, Swelling and Rash    Swelling of the face  . Sulfonamide Derivatives Itching, Swelling and Rash    Swelling of the face  . Tape Rash    Paper tape is ok  . Tramadol Nausea And Vomiting  . Doxycycline Nausea Only  . Hydrocil [Psyllium] Nausea And Vomiting  . Bactrim Itching, Swelling and Rash  . Red Dye Itching and Rash    Patient Measurements: Height: 5\' 1"  (154.9 cm) Weight: 85.1 kg (187 lb 9.8 oz) IBW/kg (Calculated) : 47.8 Heparin Dosing Weight: 82 kg  Vital Signs: Temp: 98 F (36.7 C) (11/18 0346) Temp Source: Oral (11/18 0346) BP: 158/68 (11/18 0346) Pulse Rate: 86 (11/18 0346)  Labs: Recent Labs     0000 06/03/20 0407 06/03/20 0619 06/03/20 0815 06/03/20 1008 06/03/20 1009 06/03/20 1730 06/04/20 0423 06/04/20 0423 06/04/20 1259 06/05/20 0034 06/05/20 0934  HGB   < > 12.5  --   --   --   --   --  12.0  --   --  12.4  --   HCT  --  41.7  --   --   --   --   --  39.5  --   --  42.2  --   PLT  --  199  --   --   --   --   --  168  --   --  168  --   APTT  --  32  --   --  32  --   --   --   --   --   --   --   LABPROT  --  16.1*  --   --   --   --   --  16.6*  --   --   --   --   INR  --  1.3*  --   --   --   --   --  1.4*  --   --   --   --   HEPARINUNFRC  --   --   --   --   --   --    < > 0.26*   < > 0.16* 0.30 0.40  CREATININE  --  2.92*  --   --   --   --   --  2.58*  --   --  2.65*  --   TROPONINIHS  --   --  161* 151*  --  174*  --   --   --   --   --   --    < > = values in this interval not displayed.    Estimated Creatinine Clearance: 17.9 mL/min (A) (by C-G formula based on SCr of 2.65 mg/dL (H)).   Medications:  - on warfarin PTA  Assessment: Patient is a 74 y.o F with hx bioprosthetic AVR, stroke, and afib on warfarin PTA  presented to the ED on 11/16 with fever. Pharmacy is consulted to transition to heparin drip on admission.  Today, 06/05/2020: -HL at 0934 = 0.4 = therapeutic on heparin at 1500 units/hr - CBC WNL - Scr 2.65 -no bleeding reported  Goal of Therapy:  Heparin level 0.3-0.7 units/ml Monitor platelets by anticoagulation protocol: Yes   Plan:  - continue heparin drip at 1500 units/hr -  Daily heparin level & CBC - monitor for s/s bleeding  Eudelia Bunch, Pharm.D 06/05/2020 11:07 AM

## 2020-06-05 NOTE — NC FL2 (Signed)
[place] MEDICAID FL2 LEVEL OF CARE SCREENING TOOL     IDENTIFICATION  Patient Name: [name] Birthdate: 1946-03-17 Sex: female Admission Date (Current Location): 06/03/2020  [facility] and [place] Number:  Herbalist and Address:  [facility],  [place], [place]      Provider Number: [id number]  Attending Physician Name and Address:  Donne Hazel, MD  Relative Name and Phone Number:  Ayslin, Kundert (Spouse) [phone]    Current Level of Care: Hospital Recommended Level of Care: [place] Prior Approval Number:    Date Approved/Denied:   PASRR Number: [id number]  Discharge Plan: SNF    Current Diagnoses: Patient Active Problem List   Diagnosis Date Noted  . Pulmonary hypertension, unspecified ([place])   . Atrial fibrillation with rapid ventricular response ([place]) 06/03/2020  . Sepsis ([place]) 06/03/2020  . ESRD (end stage renal disease) ([place])   . Venous stasis ulcers of both lower extremities ([place]) 01/31/2020  . Venous stasis dermatitis of both lower extremities 01/31/2020  . Claudication of both lower extremities ([place]) 01/31/2020  . Chronic venous insufficiency 12/31/2019  . Heart failure ([place]) 10/02/2019  . Dyspnea 08/20/2019  . COPD (chronic obstructive pulmonary disease) ([place]) 08/20/2019  . Hematoma of arm, left, subsequent encounter 07/23/2019  . Obesity (BMI 30-39.9) 12/20/2018  . Acute respiratory failure with hypoxia ([place]) 12/19/2018  . Gait disorder 11/22/2018  . History of seizure disorder 09/12/2018  . History of stroke 09/12/2018  . Edema 08/28/2018  . Hypertension secondary to other renal disorders 03/22/2018  . Proteinuria 03/22/2018  . Malignant neoplasm of lung ([place]) 03/22/2018  . Breast pain in female 02/04/2018  . Lung cancer ([place]) 11/30/2017  . Adenocarcinoma of lung, stage 1, right ([place]) 11/25/2017  . Chronic respiratory failure with hypoxia ([place]) 12/27/2016  . Acute on chronic  diastolic CHF (congestive heart failure) ([place]) 12/27/2016  . Left upper extremity swelling 12/27/2016  . Pain of left breast 12/27/2016  . Arm muscle atrophy 11/16/2016  . Insomnia 02/25/2016  . Renal cyst 11/21/2015  . Renal mass, right 11/10/2015  . Shoulder pain, right 08/13/2015  . Weight gain 08/12/2014  . Multinodular goiter 05/01/2014  . Encounter for therapeutic drug monitoring 08/14/2013  . S/P aortic valve replacement with bioprosthetic valve and maze procedure 04/12/2013  . Nodule of right lung 04/07/2013  . Nodule of left lung 04/07/2013  . Pain in limb 05/22/2012  . Dermatitis 01/26/2012  . Long term (current) use of anticoagulants 08/17/2011  . Anemia in chronic renal disease 05/13/2011  . Hypokalemia 04/26/2011  . Immunosuppression ([place]) 04/26/2011  . Hypertension associated with diabetes ([place]) 04/26/2011  . Renal transplant recipient 03/16/2011  . Low back pain 02/23/2011  . Hypersalivation 11/24/2010  . AORTIC STENOSIS 08/20/2010  . DISTURBANCE OF SALIVARY SECRETION 07/28/2010  . NAUSEA 04/14/2010  . SMOKER 10/07/2009  . ALOPECIA 10/07/2009  . CAROTID STENOSIS 03/04/2009  . AF (paroxysmal atrial fibrillation) ([place]) 12/31/2008  . NEOPLASM, MALIGNANT, KIDNEY 07/02/2008  . APHASIA DUE TO CEREBROVASCULAR DISEASE 07/02/2008  . Chronic fatigue 05/21/2008  . Hyperlipidemia LDL goal <70 09/27/2007  . Constipation 09/27/2007  . SLEEP APNEA 09/27/2007  . CHOLELITHIASIS 06/09/2007  . HELICOBACTER PYLORI INFECTION, HX OF 06/08/2007  . Type 2 diabetes mellitus with chronic kidney disease, with long-term current use of insulin ([place]) 05/23/2007  . Gout 05/23/2007  . 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 MYOCARDIAL INFARCTION, HX OF 05/23/2007  . Coronary atherosclerosis 05/23/2007  . GERD 05/23/2007  .  COLONIC POLYPS, HX OF 05/23/2007  . DIVERTICULOSIS, COLON 07/01/2005    Orientation RESPIRATION BLADDER Height & Weight     Self  O2 (3L [place]) External  catheter Weight: 85.1 kg Height:  5\' 1"  (154.9 cm)  BEHAVIORAL SYMPTOMS/MOOD NEUROLOGICAL BOWEL NUTRITION STATUS   (none)  (none) Incontinent Diet (see d/c summary)  AMBULATORY STATUS COMMUNICATION OF NEEDS Skin   Extensive Assist Verbally Other (Comment) (Venous stasis ulcer bilateral legs; L-open wounds and swelling, R-swelling only)                       Personal Care Assistance Level of Assistance  Bathing, Feeding, Dressing Bathing Assistance: Maximum assistance Feeding assistance: Limited assistance Dressing Assistance: Maximum assistance     Functional Limitations Info  Sight, Hearing, Speech Sight Info: Adequate Hearing Info: Adequate Speech Info: Adequate    SPECIAL CARE FACTORS FREQUENCY  PT (By licensed PT), OT (By licensed OT)     PT Frequency: 5X/W OT Frequency: 5X/W            Contractures Contractures Info: Not present    Additional Factors Info  Code Status, Allergies Code Status Info: Full Allergies Info: Ibuprofen, Sulfamethoxazole-trimethoprim, Sulfonamide Derivatives, Tape, Tramadol, Doxycycline,,Hydrocil Psyllium, Bactrim, Red Dye           Current Medications (06/05/2020):  This is the current hospital active medication list Current Facility-Administered Medications  Medication Dose Route Frequency Provider Last Rate Last Admin  . acetaminophen (TYLENOL) tablet 650 mg  650 mg Oral Q6H PRN Harold Hedge, MD   650 mg at 06/04/20 1004   Or  . acetaminophen (TYLENOL) suppository 650 mg  650 mg Rectal Q6H PRN Harold Hedge, MD      . amLODipine (NORVASC) tablet 5 mg  5 mg Oral Daily Skeet Latch, MD   5 mg at 06/05/20 1019  . ceFAZolin (ANCEF) IVPB 1 g/50 mL premix  1 g Intravenous Q12H Eudelia Bunch, RPH   Stopped at 06/05/20 1500  . Chlorhexidine Gluconate Cloth 2 % PADS 6 each  6 each Topical Q0600 Harold Hedge, MD   6 each at 06/04/20 1414  . collagenase (SANTYL) ointment   Topical Daily Harold Hedge, MD   Given at 06/05/20  1020  . furosemide (LASIX) injection 80 mg  80 mg Intravenous Q12H Skeet Latch, MD   80 mg at 06/05/20 0527  . heparin ADULT infusion 100 units/mL (25000 units/256mL sodium chloride 0.45%)  1,500 Units/hr Intravenous Continuous Donne Hazel, MD 15 mL/hr at 06/04/20 2231 1,500 Units/hr at 06/04/20 2231  . HYDROmorphone (DILAUDID) injection 0.5 mg  0.5 mg Intravenous Q4H PRN Harold Hedge, MD   0.5 mg at 06/05/20 1027  . insulin aspart (novoLOG) injection 0-9 Units  0-9 Units Subcutaneous TID WC Harold Hedge, MD   3 Units at 06/03/20 1737  . insulin glargine (LANTUS) injection 20 Units  20 Units Subcutaneous Daily Harold Hedge, MD   20 Units at 06/05/20 1019  . metoprolol tartrate (LOPRESSOR) tablet 25 mg  25 mg Oral BID Skeet Latch, MD   25 mg at 06/05/20 1019  . mupirocin ointment (BACTROBAN) 2 % 1 application  1 application Nasal BID Harold Hedge, MD   1 application at 69/67/89 1020  . polyethylene glycol (MIRALAX / GLYCOLAX) packet 17 g  17 g Oral Daily PRN Harold Hedge, MD      . sodium chloride flush (NS) 0.9 % injection 3 mL  3 mL Intravenous Q12H Harold Hedge, MD   3 mL at 06/05/20 1021     Discharge Medications: Please see discharge summary for a list of discharge medications.  Relevant Imaging Results:  Relevant Lab Results:   Additional Information [place], [place]

## 2020-06-05 NOTE — Evaluation (Signed)
Physical Therapy Evaluation Patient Details Name: [name] MRN: [id number] DOB: 10/07/1945 Today's Date: 06/05/2020   History of Present Illness  (P) 74 y.o. female  with past medical history of CAD status post stent, severe aortic stenosis status post AVR, hypertension, hyperlipidemia, OSA, COPD with intermittent O2 requirement, HFpEF, atrial fibrillation status post maze on Coumadin, type 2 diabetes, end-stage renal disease status post transplant, CKD 4, adenocarcinoma of the lung, embolic CVA with residual aphasia, bilateral lower extremity nonhealing wounds and recent abdominal aortogram with out actionable findings (would likely need eventual AKA) admitted on 06/03/2020 with sepsis, concern for endocarditis, A. fib and worsening renal failure.  Palliative consulted for goals of care.  Clinical Impression  Pt presents with significant dependencies in mobility affecting her Independence secondary to the above diagnosis. Pt presented with confusion and inability to express her needs. Pt was unable to follow simple one step commands. Pt grimaced when both LE were touched. Pt had noticeable erythremia in bil LE. Pt sat EOB with total assist +2. Unable to get accurate PLOF as no family was present in the room. Pt may benefit from a trial of PT to determine whether she can tolerate and participate with therapy for improved mobility and decreased burden of care.     Follow Up Recommendations SNF    Equipment Recommendations  None recommended by PT    Recommendations for Other Services       Precautions / Restrictions Precautions Precautions: (P) Fall Precaution Comments: (P) AMS Restrictions Weight Bearing Restrictions: (P) No      Mobility  Bed Mobility Overal bed mobility: Needs Assistance Bed Mobility: Supine to Sit     Supine to sit: HOB elevated;+2 for physical assistance;Total assist     General bed mobility comments: pt sat EOB with total assistance. Pt grimaced and  squeezed mt hand tightly but could not verbalize what was wrong. Expect LE pain and also abdominal pain as she was holding her lower abdomen after returning supine in bed.    Transfers                 General transfer comment: unable  Ambulation/Gait                Stairs            Wheelchair Mobility    Modified Rankin (Stroke Patients Only)       Balance Overall balance assessment: Needs assistance Sitting-balance support: Single extremity supported Sitting balance-Leahy Scale: Zero Sitting balance - Comments: Pt unable to tolerate sitting EOB due to pain level and decreased cognition and aphasia as she was unable to communicate her needs.                                     Pertinent Vitals/Pain Pain Assessment: (P) Faces Faces Pain Scale: Hurts whole lot Pain Location: legs Pain Descriptors / Indicators: Grimacing;Guarding;Moaning Pain Intervention(s): Repositioned;Limited activity within patient's tolerance;Monitored during session    [place] expects to be discharged to:: (P) Unsure Living Arrangements: (P) Spouse/significant other               Additional Comments: (P) Pt was unable to provide a PLOF due to decreased cognition and spous not present at time of evaluation.    Prior Function           Comments: (P) unknown     Hand Dominance   Dominant  Hand: (P) Right    Extremity/Trunk Assessment   Upper Extremity Assessment Upper Extremity Assessment: Defer to OT evaluation    Lower Extremity Assessment Lower Extremity Assessment: Difficult to assess due to impaired cognition       Communication   Communication: (P) Expressive difficulties  Cognition Arousal/Alertness: Lethargic Behavior During Therapy: Restless Overall Cognitive Status: No family/caregiver present to determine baseline cognitive functioning                                 General Comments: H/o aphasia,  able to state her name, but not oriented to location and situation. Unable to follow one step commands and verbalize needs.      General Comments General comments (skin integrity, edema, etc.): erythemia bil LE, edema, very sensitive to light tough in LE    Exercises     Assessment/Plan    PT Assessment Patient needs continued PT services  PT Problem List Decreased strength;Decreased balance;Decreased cognition;Decreased knowledge of precautions;Pain;Decreased range of motion;Decreased mobility;Decreased knowledge of use of DME;Decreased activity tolerance;Decreased safety awareness;Decreased coordination       PT Treatment Interventions DME instruction;Functional mobility training;Balance training;Patient/family education;Therapeutic activities;Therapeutic exercise;Neuromuscular re-education    PT Goals (Current goals can be found in the Care Plan section)  Acute Rehab PT Goals PT Goal Formulation: Patient unable to participate in goal setting Time For Goal Achievement: 06/19/20 Potential to Achieve Goals: Fair    Frequency Min 2X/week   Barriers to discharge        Co-evaluation PT/OT/SLP Co-Evaluation/Treatment: Yes Reason for Co-Treatment: (P) Complexity of the patient's impairments (multi-system involvement);Necessary to address cognition/behavior during functional activity PT goals addressed during session: Mobility/safety with mobility;Strengthening/ROM OT goals addressed during session: (P) ADL's and self-care       AM-PAC PT "6 Clicks" Mobility  Outcome Measure Help needed turning from your back to your side while in a flat bed without using bedrails?: Total Help needed moving from lying on your back to sitting on the side of a flat bed without using bedrails?: Total Help needed moving to and from a bed to a chair (including a wheelchair)?: Total Help needed standing up from a chair using your arms (e.g., wheelchair or bedside chair)?: Total Help needed to walk in  hospital room?: Total Help needed climbing 3-5 steps with a railing? : Total 6 Click Score: 6    End of Session   Activity Tolerance: Patient limited by pain;Treatment limited secondary to medical complications (Comment) (decreased cognition) Patient left: in bed;with call bell/phone within reach;with bed alarm set Nurse Communication: Need for lift equipment PT Visit Diagnosis: Muscle weakness (generalized) (M62.81);Other symptoms and signs involving the nervous system (R29.898);Pain;Other abnormalities of gait and mobility (R26.89) Pain - part of body: Leg    Time: 6378-5885 PT Time Calculation (min) (ACUTE ONLY): 20 min   Charges:   PT Evaluation $PT Eval Moderate Complexity: 1 Mod          Lelon Mast 06/05/2020, 1:52 PM

## 2020-06-06 DIAGNOSIS — Z8673 Personal history of transient ischemic attack (TIA), and cerebral infarction without residual deficits: Secondary | ICD-10-CM | POA: Diagnosis not present

## 2020-06-06 DIAGNOSIS — L03116 Cellulitis of left lower limb: Secondary | ICD-10-CM | POA: Diagnosis not present

## 2020-06-06 DIAGNOSIS — I4891 Unspecified atrial fibrillation: Secondary | ICD-10-CM | POA: Diagnosis not present

## 2020-06-06 DIAGNOSIS — A419 Sepsis, unspecified organism: Secondary | ICD-10-CM | POA: Diagnosis not present

## 2020-06-06 DIAGNOSIS — I5033 Acute on chronic diastolic (congestive) heart failure: Secondary | ICD-10-CM | POA: Diagnosis not present

## 2020-06-06 DIAGNOSIS — N186 End stage renal disease: Secondary | ICD-10-CM | POA: Diagnosis not present

## 2020-06-06 DIAGNOSIS — L03115 Cellulitis of right lower limb: Secondary | ICD-10-CM | POA: Diagnosis not present

## 2020-06-06 DIAGNOSIS — Z7189 Other specified counseling: Secondary | ICD-10-CM | POA: Diagnosis not present

## 2020-06-06 DIAGNOSIS — E785 Hyperlipidemia, unspecified: Secondary | ICD-10-CM

## 2020-06-06 LAB — HEPARIN LEVEL (UNFRACTIONATED): Heparin Unfractionated: 0.37 IU/mL (ref 0.30–0.70)

## 2020-06-06 LAB — GLUCOSE, CAPILLARY
Glucose-Capillary: 122 mg/dL — ABNORMAL HIGH (ref 70–99)
Glucose-Capillary: 145 mg/dL — ABNORMAL HIGH (ref 70–99)
Glucose-Capillary: 81 mg/dL (ref 70–99)
Glucose-Capillary: 63 mg/dL — ABNORMAL LOW (ref 70–99)
Glucose-Capillary: 87 mg/dL (ref 70–99)

## 2020-06-06 LAB — CBC
HCT: 39.2 % (ref 36.0–46.0)
Hemoglobin: 11.6 g/dL — ABNORMAL LOW (ref 12.0–15.0)
MCH: 23.9 pg — ABNORMAL LOW (ref 26.0–34.0)
MCHC: 29.6 g/dL — ABNORMAL LOW (ref 30.0–36.0)
MCV: 80.8 fL (ref 80.0–100.0)
Platelets: 186 10*3/uL (ref 150–400)
RBC: 4.85 MIL/uL (ref 3.87–5.11)
RDW: 20.6 % — ABNORMAL HIGH (ref 11.5–15.5)
WBC: 13.4 10*3/uL — ABNORMAL HIGH (ref 4.0–10.5)
nRBC: 0 % (ref 0.0–0.2)

## 2020-06-06 LAB — LIPID PANEL
Cholesterol: 90 mg/dL (ref 0–200)
HDL: 50 mg/dL (ref >40)
LDL Cholesterol: 32 mg/dL (ref 0–99)
Total CHOL/HDL Ratio: 1.8 RATIO
Triglycerides: 39 mg/dL (ref <150)
VLDL: 8 mg/dL (ref 0–40)

## 2020-06-06 LAB — BASIC METABOLIC PANEL
Anion gap: 12 (ref 5–15)
BUN: 44 mg/dL — ABNORMAL HIGH (ref 8–23)
CO2: 32 mmol/L (ref 22–32)
Calcium: 9.4 mg/dL (ref 8.9–10.3)
Chloride: 95 mmol/L — ABNORMAL LOW (ref 98–111)
Creatinine, Ser: 2.64 mg/dL — ABNORMAL HIGH (ref 0.44–1.00)
GFR, Estimated: 18 mL/min — ABNORMAL LOW (ref >60)
Glucose, Bld: 128 mg/dL — ABNORMAL HIGH (ref 70–99)
Potassium: 3.2 mmol/L — ABNORMAL LOW (ref 3.5–5.1)
Sodium: 139 mmol/L (ref 135–145)

## 2020-06-06 MED ORDER — OXYCODONE HCL ER 10 MG PO T12A
10.0000 mg | EXTENDED_RELEASE_TABLET | Freq: Two times a day (BID) | ORAL | Status: DC
  Administered 2020-06-06 – 2020-06-07 (×3): 10 mg via ORAL
  Filled 2020-06-06 (×3): qty 1

## 2020-06-06 MED ORDER — OXYCODONE-ACETAMINOPHEN 5-325 MG PO TABS: 1.0000 | ORAL_TABLET | ORAL | Status: DC | PRN

## 2020-06-06 MED ORDER — LINEZOLID 600 MG PO TABS
600.0000 mg | ORAL_TABLET | Freq: Two times a day (BID) | ORAL | Status: DC
  Administered 2020-06-06 – 2020-06-09 (×6): 600 mg via ORAL
  Filled 2020-06-06 (×9): qty 1

## 2020-06-06 MED ORDER — TORSEMIDE 100 MG PO TABS
100.0000 mg | ORAL_TABLET | Freq: Every day | ORAL | Status: DC
  Administered 2020-06-07 – 2020-06-16 (×8): 100 mg via ORAL
  Filled 2020-06-06 (×12): qty 1

## 2020-06-06 MED ORDER — PREDNISONE 5 MG PO TABS
5.0000 mg | ORAL_TABLET | Freq: Every day | ORAL | Status: DC
  Administered 2020-06-06 – 2020-06-16 (×10): 5 mg via ORAL
  Filled 2020-06-06 (×11): qty 1

## 2020-06-06 MED ORDER — TACROLIMUS 1 MG PO CAPS
3.0000 mg | ORAL_CAPSULE | Freq: Two times a day (BID) | ORAL | Status: DC
  Administered 2020-06-06 – 2020-06-16 (×20): 3 mg via ORAL
  Filled 2020-06-06 (×23): qty 3

## 2020-06-06 MED ORDER — ACETAMINOPHEN 325 MG PO TABS
650.0000 mg | ORAL_TABLET | Freq: Four times a day (QID) | ORAL | Status: DC
  Administered 2020-06-06 – 2020-06-16 (×29): 650 mg via ORAL
  Filled 2020-06-06 (×36): qty 2

## 2020-06-06 MED ORDER — DEXTROSE 50 % IV SOLN
INTRAVENOUS | Status: AC
  Filled 2020-06-06: qty 50

## 2020-06-06 MED ORDER — OXYCODONE HCL 5 MG PO TABS
5.0000 mg | ORAL_TABLET | ORAL | Status: DC | PRN
  Administered 2020-06-09 – 2020-06-16 (×6): 5 mg via ORAL
  Filled 2020-06-06 (×9): qty 1

## 2020-06-06 NOTE — Progress Notes (Addendum)
Progress Note  Patient Name: [name] Date of Encounter: 06/06/2020  [facility] Cardiologist: [name], MD   Subjective   Writhing in pain from her leg.  Denies shortness of breath  Inpatient Medications    Scheduled Meds: . amLODipine  5 mg Oral Daily  . Chlorhexidine Gluconate Cloth  6 each Topical Q0600  . collagenase   Topical Daily  . furosemide  80 mg Intravenous Q12H  . insulin aspart  0-9 Units Subcutaneous TID WC  . insulin glargine  16 Units Subcutaneous Daily  . metoprolol tartrate  25 mg Oral BID  . mupirocin ointment  1 application Nasal BID  . sodium chloride flush  3 mL Intravenous Q12H   Continuous Infusions: . heparin 1,500 Units/hr (06/05/20 1549)  . linezolid (ZYVOX) IV 600 mg (06/05/20 2119)   PRN Meds: acetaminophen **OR** acetaminophen, HYDROmorphone (DILAUDID) injection, polyethylene glycol   Vital Signs    Vitals:   06/05/20 0527 06/05/20 1427 06/05/20 1941 06/06/20 0353  BP:  (!) 155/84 131/61 129/67  Pulse:  94 88 94  Resp:  16 16 18   Temp:  98.8 F (37.1 C) 98.1 F (36.7 C) 98.1 F (36.7 C)  TempSrc:   Oral   SpO2:  97% 96% 95%  Weight: 85.1 kg     Height:        Intake/Output Summary (Last 24 hours) at 06/06/2020 0814 Last data filed at 06/06/2020 0300 Gross per 24 hour  Intake 1564.79 ml  Output 150 ml  Net 1414.79 ml   Last 3 Weights 06/05/2020 06/04/2020 06/04/2020  Weight (lbs) 187 lb 9.8 oz 187 lb 6.3 oz 181 lb 14.1 oz  Weight (kg) 85.1 kg 85 kg 82.5 kg      Telemetry    Atrial fibrillation.  Rate < 100 bpm.   - Personally Reviewed  ECG    n/a - Personally Reviewed  Physical Exam   VS:  BP 129/67   Pulse 94   Temp 98.1 F (36.7 C)   Resp 18   Ht 5\' 1"  (1.549 m)   Wt 85.1 kg   SpO2 95%   BMI 35.45 kg/m  , BMI Body mass index is 35.45 kg/m. GENERAL:  Clearly in pain HEENT: Pupils equal round and reactive, fundi not visualized, oral mucosa unremarkable NECK:  JVP to clavicle at 45  degrees, waveform within normal limits, carotid upstroke brisk and symmetric, no bruits LUNGS:  Clear to auscultation bilaterally HEART:  RRR.  PMI not displaced or sustained, S1 and S2 within normal limits, no S3, no S4, no clicks, no rubs, no murmurs ABD:  Flat, positive bowel sounds normal in frequency in pitch, no bruits, no rebound, no guarding, no midline pulsatile mass, no hepatomegaly, no splenomegaly EXT:  1+ R LE edema.  L LE wrapped.  2+ pedal edema on L, no cyanosis no clubbing SKIN:  No rashes no nodules NEURO:  Cranial nerves II through XII grossly intact, motor grossly intact throughout PSYCH:  Cognitively intact, oriented to person place and time   Labs    High Sensitivity Troponin:   Recent Labs  Lab 06/03/20 0619 06/03/20 0815 06/03/20 1009  TROPONINIHS 161* 151* 174*      Chemistry Recent Labs  Lab 06/03/20 0407 06/03/20 0407 06/04/20 0423 06/05/20 0034 06/06/20 0440  NA 141   < > 138 136 139  K 4.6   < > 3.8 5.9* 3.2*  CL 100   < > 101 95* 95*  CO2 30   < >  28 28 32  GLUCOSE 239*   < > 113* 123* 128*  BUN 61*   < > 52* 46* 44*  CREATININE 2.92*   < > 2.58* 2.65* 2.64*  CALCIUM 10.2   < > 9.7 9.1 9.4  PROT 7.2  --   --   --   --   ALBUMIN 2.8*  --   --   --   --   AST 23  --   --   --   --   ALT 15  --   --   --   --   ALKPHOS 136*  --   --   --   --   BILITOT 1.1  --   --   --   --   GFRNONAA 16*   < > 19* 18* 18*  ANIONGAP 11   < > 9 13 12    < > = values in this interval not displayed.     Hematology Recent Labs  Lab 06/04/20 0423 06/05/20 0034 06/06/20 0440  WBC 16.0* 14.5* 13.4*  RBC 4.96 5.19* 4.85  HGB 12.0 12.4 11.6*  HCT 39.5 42.2 39.2  MCV 79.6* 81.3 80.8  MCH 24.2* 23.9* 23.9*  MCHC 30.4 29.4* 29.6*  RDW 21.0* 21.4* 20.6*  PLT 168 168 186    BNP Recent Labs  Lab 06/03/20 0619  BNP 1,883.6*     DDimer No results for input(s): DDIMER in the last 168 hours.   Radiology    CT TIBIA FIBULA LEFT WO CONTRAST  Result  Date: 06/05/2020 CLINICAL DATA:  Open wounds involving the right lower extremity and diffuse soft tissue swelling, redness and pain. EXAM: CT OF THE LOWER LEFT EXTREMITY WITHOUT CONTRAST TECHNIQUE: Multidetector CT imaging of the lower left extremity was performed according to the standard protocol. COMPARISON:  None. FINDINGS: There are open wounds noted involving the anterior aspect of the left lower extremity. There is a fairly deep 3.7 cm long soft tissue defect involving the anterior aspect at the mid tibial level. Slightly more inferiorly and medially there is a second lesion measuring 18 mm. Diffuse and fairly marked skin thickening, subcutaneous soft tissue swelling/edema/fluid consistent with cellulitis. I do not see a discrete fluid collection to suggest a drainable soft tissue abscess. Although difficult with CT I believe there is also diffuse myofasciitis without obvious findings for pyomyositis. No findings suspicious for septic arthritis at the knee or ankle joints. Extensive vascular calcifications are noted. IMPRESSION: 1. Open wounds involving the anterior aspect of the left lower extremity with 2 areas of deep ulceration as described above. 2. Diffuse and fairly marked skin thickening, subcutaneous soft tissue swelling/edema/fluid consistent with cellulitis. 3. No discrete fluid collection to suggest a drainable soft tissue abscess. 4. Suspect diffuse myofasciitis without obvious findings for pyomyositis. 5. No findings suspicious for septic arthritis at the knee or ankle joints. 6. Extensive vascular calcifications. Electronically Signed   By: Marijo Sanes M.D.   On: 06/05/2020 14:47    Cardiac Studies   Echo 06/03/20: 1. Left ventricular ejection fraction, by estimation, is 55 to 60%. The  left ventricle has normal function. The left ventricle demonstrates  regional wall motion abnormalities (see scoring diagram/findings for  description). There is mild left ventricular  hypertrophy.  Left ventricular diastolic parameters are indeterminate.  2. Right ventricular systolic function is moderately reduced. The right  ventricular size is moderately enlarged. There is severely elevated  pulmonary artery systolic pressure. The estimated right ventricular  systolic  pressure is 73.4 mmHg.  3. Left atrial size was severely dilated.  4. Right atrial size was moderately dilated.  5. Suspect small flail segment on anterior mitral leaflet causing very  eccentric posteriorly directed mitral valve regurgitation that appears at  least moderate-severe. From short axis view may involve A3 scallop.. The  mitral valve is abnormal. Moderate  to severe mitral valve regurgitation. Severe mitral annular calcification.  6. Tricuspid valve regurgitation is severe.  7. The aortic valve has been repaired/replaced. Aortic valve  regurgitation is not visualized. There is a 23 mm Edwards bovine valve  present in the aortic position. Procedure Date: 04/07/2013. Echo findings  are consistent with normal structure and  function of the aortic valve prosthesis. Aortic valve mean gradient  measures 18.2 mmHg.  8. The inferior vena cava is dilated in size with <50% respiratory  variability, suggesting right atrial pressure of 15 mmHg.   Patient Profile     74 y.o. female with CAD s/p PCI, AS s/p bioprosthetic AVR, ESRD status post renal transplant, CKD 4, severe pulmonary hypertensin, embolic stroke with expressive aphasia, diabetes, hypertension, hyperlipidemia, OSA, COPD, atrial fibrillation status post Maze procedure, chronic diastolic heart failure, PAD, and lung adenocarcinoma that is untreated admitted with lower extremity ulcers, sepsis secondary to cellulitis, and atrial fibrillation with rapid ventricular response.    Assessment & Plan    # Afib with RVR: Rates are now well-controlled on metoprolol.  On IV heparin and warfarin on hold.  Resume once it is clear she won't need procedures.    # Acute on chronic diastolic heart failure: # Severe TR: # moderate to severe MR:  Flail mitral valve leaflet with moderate to severe TR.  Diuresis for now.  Given that she has untreated malignancy, suspect she would not be a candidate for MitraClip.  She wouldn't be a candidate for open repair.  Continue to manage with diuretics.  Her volume status is better.  Will give one more dose of IV lasix and switch back to her home torsemide tomorrow.  # Hypertension:  Continue amlodipine and metoprolol.  Continue metoprolol.    # CAD s/p PCI: # PAD:  Diffuse calcification noted on CT.  Not on aspirin presumably b/c she is on warfarin at baseline.  Check lipids in am.  Unclear why she isn't on a statin.  # CKD IV:  # S/p renal transplant: Renal function stable with diuresis.  # Sepsis 2/2 cellulitis: CT showed diffuse myofasciitis.  Antibiotics per IM.  Persistent leukocytosis.  Patient is writhing in pain.  She is getting dilaudid IV.  Will add percocet prn.  Hopefully this will last longer and she won't need IV meds.  # s/p AVR:  Bioprosthetic AV.        For questions or updates, please contact [facility] Please consult [email or web address] for contact info under        Signed, Skeet Latch, MD  06/06/2020, 8:14 AM

## 2020-06-06 NOTE — Progress Notes (Signed)
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 Apparently per EMS, the patient originally had called out for a nosebleed but upon arrival the patient was found to be febrile, tachypneic and tachycardic with difficulty clearing secretions.   Assessment & Plan:   Principal Problem:   Sepsis ([place]) Active Problems:   Type 2 diabetes mellitus with chronic kidney disease, with long-term current use of insulin (HCC)   Hyperlipidemia LDL goal <70   Renal transplant recipient   S/P aortic valve replacement with bioprosthetic valve and maze procedure   Acute on chronic diastolic CHF (congestive heart failure) (HCC)   History of stroke   Venous stasis ulcers of both lower extremities (HCC)   ESRD (end stage renal disease) (HCC)   Atrial fibrillation with rapid ventricular response ([place])   Pulmonary hypertension, unspecified ([place])   1. Sepsis without septic shock, from suspected LLE purulent cellulitis and Proteus UTI a. Presented febrile, tachycardic, tachypneic, leukocytosis b. Chronic bilateral lower extremity wounds, with concerns for active cellulitis c. Has a history of aortic stenosis s/p AVR with a heart failure exacerbation -> echo reviewed with no mention of vegetation d. Blood cultures neg e. Holding IV fluids due to heart failure f. Was on empiric vancomycin and meropenem, now narrowed to zyvox g. Palliative care consulted at time of  presentation for GOC h. Pt complained of LLE pain. CT LLE ordered and reviewed. Findings of open ulceration with celluliits and myofasciitis. Cont on abx  i. Chart reviewed. Pt has hx of non-operative peripheral vascular disease with non-healing wounds. Per Vascular Surgeon, if wound pain becomes too unbearable, then amputation would be the next consideration. Discussed with patient's husband who reports LE pain has been chronic j. Appreciate assistance by palliative care regarding pain management. Have added oxycontin with oxycodone  2. Atrial fibrillation with RVR with History of MAZE procedure a. On Coumadin and beta-blocker outpatient b. INR 1.4 on most recent check c. Was initially placed on cardizem gtt, now transitioned to metoprolol per Cardiology, rate controlled. Appreciate assistance by Cardiology d. On Heparin drip at this time  3. Acute on chronic heart failure exacerbation, suspect diastolic a. Echo 2/83/1517: EF 60 to 65% b. Received Lasix 80 mg IV x1 c. Likely exacerbated by A. fib and possible endocarditis d. Cardiology following, appreciate assistance  4. Acute on chronic hypoxemic respiratory failure likely from CHF a. Initially on room air then SpO2 dropped to 84%, currently on 2 LPM b. Has intermittent O2 requirement at baseline c. pO2 27 on VBG d. Continued on 2LNC. Pt is continued on lasix 80mg  IV BID e. Repeat bmet in AM  5. Abdominal pain in the setting of recent abdominal aortogram for chronic wounds a. CT abdomen pelvis without contrast ordered - new body wall edema otherwise no acute findings  6. ESRD s/p renal transplant, now CKD IV on immunosuppression a. Discussed case with Nephrology. Pt to continue tacrolimus and prednisone b. Repeat bmet in AM  7. Severe aortic stenosis s/p AVR a. Was on  coumadin prior to admit, now on heparin gtt b. INR most recently 1.4 c. Cardiology consulted, appreciate assistance  8. Hypertension a. Now off cardizem  gtt and on PO metoprolol b. Cont on norvasc c. BP stable and controlled  9. Hyperlipidemia a. Cont diet as tolerate  10. CAD s/p stent a. On coumadin and statin prior to admit b. Currently on heparin gtt per above  11. Embolic CVA with residual expressive aphasia a. Subtherapeutic INR -> CT head -> prior infarct at the junction of the left temporal parietal and occipital lobes, no acute infarct b. Passed SLP eval  12. Diabetes a. Pt had been continued on Lantus 20 u daily with sliding scale b. Continue 16 units as tolerated  13. Chronic bilateral lower extremity wounds a. WOC eval and recs noted  14. L Arm Swelling -Will check LUE doppler to r/o DVT -continued on therapeutic anticoagulation    DVT prophylaxis: Heparin gtt Code Status: Full Family Communication: Pt in room, family not at bedside  Status is: Inpatient  Remains inpatient appropriate because:Ongoing active pain requiring inpatient pain management and Unsafe d/c plan   Dispo: The patient is from: Home              Anticipated d/c is to: SNF              Anticipated d/c date is: > 3 days              Patient currently is not medically stable to d/c.    Consultants:   Cardiology  Discussed case with Nephrology  Procedures:     Antimicrobials: Anti-infectives (From admission, onward)   Start     Dose/Rate Route Frequency Ordered Stop   06/06/20 2200  linezolid (ZYVOX) tablet 600 mg        600 mg Oral Every 12 hours 06/06/20 1136     06/05/20 2200  linezolid (ZYVOX) IVPB 600 mg  Status:  Discontinued        600 mg 300 mL/hr over 60 Minutes Intravenous Every 12 hours 06/05/20 1814 06/06/20 1136   06/05/20 1400  ceFAZolin (ANCEF) IVPB 1 g/50 mL premix  Status:  Discontinued        1 g 100 mL/hr over 30 Minutes Intravenous Every 8 hours 06/05/20 0931 06/05/20 1006   06/05/20 1100  ceFAZolin (ANCEF) IVPB 1 g/50 mL premix  Status:  Discontinued        1 g 100 mL/hr over 30 Minutes Intravenous  Every 12 hours 06/05/20 1006 06/05/20 1811   06/04/20 2200  cefTRIAXone (ROCEPHIN) 2 g in sodium chloride 0.9 % 100 mL IVPB  Status:  Discontinued        2 g 200 mL/hr over 30 Minutes Intravenous Every 24 hours 06/04/20 1315 06/05/20 0931   06/04/20 1415  linezolid (ZYVOX) IVPB 600 mg  Status:  Discontinued        600 mg 300 mL/hr over 60 Minutes Intravenous Every 12 hours 06/04/20 1315 06/05/20 0931   06/04/20 1415  metroNIDAZOLE (FLAGYL) tablet 500 mg  Status:  Discontinued        500 mg Oral Every 8 hours 06/04/20 1315 06/05/20 0931   06/03/20 1800  meropenem (MERREM) 500 mg in sodium chloride 0.9 % 100 mL IVPB  Status:  Discontinued        500 mg 200 mL/hr over 30 Minutes Intravenous Every 12 hours 06/03/20 0907 06/04/20 1315   06/03/20 0909  vancomycin variable dose per unstable renal function (pharmacist  dosing)  Status:  Discontinued         Does not apply See admin instructions 06/03/20 0909 06/04/20 1315   06/03/20 0430  ceFEPIme (MAXIPIME) 2 g in sodium chloride 0.9 % 100 mL IVPB        2 g 200 mL/hr over 30 Minutes Intravenous  Once 06/03/20 0423 06/03/20 0617   06/03/20 0430  piperacillin-tazobactam (ZOSYN) IVPB 3.375 g        3.375 g 12.5 mL/hr over 240 Minutes Intravenous  Once 06/03/20 0423 06/03/20 0909   06/03/20 0430  vancomycin (VANCOCIN) IVPB 1000 mg/200 mL premix        1,000 mg 200 mL/hr over 60 Minutes Intravenous  Once 06/03/20 0423 06/03/20 0617      Subjective: Grimacing this AM in pain  Objective: Vitals:   06/05/20 1427 06/05/20 1941 06/06/20 0353 06/06/20 1321  BP: (!) 155/84 131/61 129/67 (!) 118/57  Pulse: 94 88 94 74  Resp: 16 16 18 16   Temp: 98.8 F (37.1 C) 98.1 F (36.7 C) 98.1 F (36.7 C) 98.7 F (37.1 C)  TempSrc:  Oral  Oral  SpO2: 97% 96% 95% 94%  Weight:      Height:        Intake/Output Summary (Last 24 hours) at 06/06/2020 1641 Last data filed at 06/06/2020 1441 Gross per 24 hour  Intake 1160.79 ml  Output 1050 ml  Net  110.79 ml   Filed Weights   06/04/20 0424 06/04/20 0852 06/05/20 0527  Weight: 82.5 kg 85 kg 85.1 kg    Examination: General exam: Conversant, in no acute distress Respiratory system: normal chest rise, clear, no audible wheezing Cardiovascular system: regular rhythm, s1-s2 Gastrointestinal system: Nondistended, nontender, pos BS Central nervous system: No seizures, no tremors Extremities: No cyanosis, no joint deformities, LLE pain on minimal palpation Skin: No rashes, no pallor Psychiatry: Affect normal // no auditory hallucinations   Data Reviewed: I have personally reviewed following labs and imaging studies  CBC: Recent Labs  Lab 06/03/20 0407 06/04/20 0423 06/05/20 0034 06/06/20 0440  WBC 14.2* 16.0* 14.5* 13.4*  NEUTROABS 12.3*  --   --   --   HGB 12.5 12.0 12.4 11.6*  HCT 41.7 39.5 42.2 39.2  MCV 80.0 79.6* 81.3 80.8  PLT 199 168 168 010   Basic Metabolic Panel: Recent Labs  Lab 06/03/20 0407 06/04/20 0423 06/05/20 0034 06/06/20 0440  NA 141 138 136 139  K 4.6 3.8 5.9* 3.2*  CL 100 101 95* 95*  CO2 30 28 28  32  GLUCOSE 239* 113* 123* 128*  BUN 61* 52* 46* 44*  CREATININE 2.92* 2.58* 2.65* 2.64*  CALCIUM 10.2 9.7 9.1 9.4   GFR: Estimated Creatinine Clearance: 17.9 mL/min (A) (by C-G formula based on SCr of 2.64 mg/dL (H)). Liver Function Tests: Recent Labs  Lab 06/03/20 0407  AST 23  ALT 15  ALKPHOS 136*  BILITOT 1.1  PROT 7.2  ALBUMIN 2.8*   No results for input(s): LIPASE, AMYLASE in the last 168 hours. No results for input(s): AMMONIA in the last 168 hours. Coagulation Profile: Recent Labs  Lab 06/03/20 0407 06/04/20 0423  INR 1.3* 1.4*   Cardiac Enzymes: Recent Labs  Lab 06/05/20 1159  CKTOTAL 98   BNP (last 3 results) No results for input(s): PROBNP in the last 8760 hours. HbA1C: No results for input(s): HGBA1C in the last 72 hours. CBG: Recent Labs  Lab 06/05/20 2148 06/05/20 2232 06/06/20 0729 06/06/20 1134  06/06/20 1631  GLUCAP 76 118* 122* 145* 81   Lipid Profile: Recent Labs    06/06/20 0440  CHOL 90  HDL 50  LDLCALC 32  TRIG 39  CHOLHDL 1.8   Thyroid Function Tests: No results for input(s): TSH, T4TOTAL, FREET4, T3FREE, THYROIDAB in the last 72 hours. Anemia Panel: No results for input(s): VITAMINB12, FOLATE, FERRITIN, TIBC, IRON, RETICCTPCT in the last 72 hours. Sepsis Labs: Recent Labs  Lab 06/03/20 0407 06/03/20 0619  LATICACIDVEN 1.7 1.5    Recent Results (from the past 240 hour(s))  SARS CORONAVIRUS 2 (TAT 6-24 HRS) Nasopharyngeal Nasopharyngeal Swab     Status: None   Collection Time: 05/29/20 11:16 AM   Specimen: Nasopharyngeal Swab  Result Value Ref Range Status   SARS Coronavirus 2 NEGATIVE NEGATIVE Final    Comment: (NOTE) SARS-CoV-2 target nucleic acids are NOT DETECTED.  The SARS-CoV-2 RNA is generally detectable in upper and lower respiratory specimens during the acute phase of infection. Negative results do not preclude SARS-CoV-2 infection, do not rule out co-infections with other pathogens, and should not be used as the sole basis for treatment or other patient management decisions. Negative results must be combined with clinical observations, patient history, and epidemiological information. The expected result is Negative.  Fact Sheet for Patients: SugarRoll.be  Fact Sheet for Healthcare Providers: https://www.woods-mathews.com/  This test is not yet approved or cleared by the [place] FDA and  has been authorized for detection and/or diagnosis of SARS-CoV-2 by FDA under an Emergency Use Authorization (EUA). This EUA will remain  in effect (meaning this test can be used) for the duration of the COVID-19 declaration under Se ction 564(b)(1) of the Act, 21 U.S.C. section 360bbb-3(b)(1), unless the authorization is terminated or revoked sooner.  Performed at [facility], [place] [address],  [place], [place] [address]   Culture, blood (Routine x 2)     Status: None (Preliminary result)   Collection Time: 06/03/20  4:07 AM   Specimen: BLOOD  Result Value Ref Range Status   Specimen Description   Final    BLOOD RIGHT ANTECUBITAL Performed at [facility] [address] [place], [place] [address]    Special Requests   Final    BOTTLES DRAWN AEROBIC AND ANAEROBIC Blood Culture adequate volume Performed at [facility] [address], [place], [place] [address]    Culture   Final    NO GROWTH 3 DAYS Performed at [facility], [place] [address] [place], [place] [address]    Report Status PENDING  Incomplete  Culture, blood (Routine x 2)     Status: None (Preliminary result)   Collection Time: 06/03/20  4:07 AM   Specimen: BLOOD RIGHT HAND  Result Value Ref Range Status   Specimen Description   Final    BLOOD RIGHT HAND Performed at [facility] [address], [place], [place] [address]    Special Requests   Final    BOTTLES DRAWN AEROBIC AND ANAEROBIC Blood Culture results may not be optimal due to an inadequate volume of blood received in culture bottles Performed at [facility] [address], [place], [place] [address]    Culture   Final    NO GROWTH 3 DAYS Performed at [facility], [place] [address], [place],  [address]    Report Status PENDING  Incomplete  Urine culture     Status: Abnormal   Collection Time: 06/03/20  4:29 AM  Specimen: In/Out Cath Urine  Result Value Ref Range Status   Specimen Description   Final    IN/OUT CATH URINE Performed at [facility] [address], [place], [place] [address]    Special Requests   Final    NONE Performed at [facility], [place] [address], [place], [place] [address]    Culture 10,000 COLONIES/mL PROTEUS MIRABILIS (A)  Final   Report Status 06/05/2020 FINAL  Final   Organism ID,  Bacteria PROTEUS MIRABILIS (A)  Final      Susceptibility   Proteus mirabilis - MIC*    AMPICILLIN <=2 SENSITIVE Sensitive     CEFAZOLIN <=4 SENSITIVE Sensitive     CEFEPIME <=0.12 SENSITIVE Sensitive     CEFTRIAXONE <=0.25 SENSITIVE Sensitive     CIPROFLOXACIN <=0.25 SENSITIVE Sensitive     GENTAMICIN <=1 SENSITIVE Sensitive     IMIPENEM 8 INTERMEDIATE Intermediate     NITROFURANTOIN 128 RESISTANT Resistant     TRIMETH/SULFA <=20 SENSITIVE Sensitive     AMPICILLIN/SULBACTAM <=2 SENSITIVE Sensitive     PIP/TAZO <=4 SENSITIVE Sensitive     * 10,000 COLONIES/mL PROTEUS MIRABILIS  Resp Panel by RT PCR (RSV, Flu A&B, Covid) - Nasopharyngeal Swab     Status: None   Collection Time: 06/03/20  8:14 AM   Specimen: Nasopharyngeal Swab  Result Value Ref Range Status   SARS Coronavirus 2 by RT PCR NEGATIVE NEGATIVE Final    Comment: (NOTE) SARS-CoV-2 target nucleic acids are NOT DETECTED.  The SARS-CoV-2 RNA is generally detectable in upper respiratoy specimens during the acute phase of infection. The lowest concentration of SARS-CoV-2 viral copies this assay can detect is 131 copies/mL. A negative result does not preclude SARS-Cov-2 infection and should not be used as the sole basis for treatment or other patient management decisions. A negative result may occur with  improper specimen collection/handling, submission of specimen other than nasopharyngeal swab, presence of viral mutation(s) within the areas targeted by this assay, and inadequate number of viral copies (<131 copies/mL). A negative result must be combined with clinical observations, patient history, and epidemiological information. The expected result is Negative.  Fact Sheet for Patients:  PinkCheek.be  Fact Sheet for Healthcare Providers:  GravelBags.it  This test is no t yet approved or cleared by the [place] FDA and  has been authorized for detection  and/or diagnosis of SARS-CoV-2 by FDA under an Emergency Use Authorization (EUA). This EUA will remain  in effect (meaning this test can be used) for the duration of the COVID-19 declaration under Section 564(b)(1) of the Act, 21 U.S.C. section 360bbb-3(b)(1), unless the authorization is terminated or revoked sooner.     Influenza A by PCR NEGATIVE NEGATIVE Final   Influenza B by PCR NEGATIVE NEGATIVE Final    Comment: (NOTE) The Xpert Xpress SARS-CoV-2/FLU/RSV assay is intended as an aid in  the diagnosis of influenza from Nasopharyngeal swab specimens and  should not be used as a sole basis for treatment. Nasal washings and  aspirates are unacceptable for Xpert Xpress SARS-CoV-2/FLU/RSV  testing.  Fact Sheet for Patients: PinkCheek.be  Fact Sheet for Healthcare Providers: GravelBags.it  This test is not yet approved or cleared by the [place] FDA and  has been authorized for detection and/or diagnosis of SARS-CoV-2 by  FDA under an Emergency Use Authorization (EUA). This EUA will remain  in effect (meaning this test can be used) for the duration of the  Covid-19 declaration under Section 564(b)(1) of  the Act, 21  U.S.C. section 360bbb-3(b)(1), unless the authorization is  terminated or revoked.    Respiratory Syncytial Virus by PCR NEGATIVE NEGATIVE Final    Comment: (NOTE) Fact Sheet for Patients: PinkCheek.be  Fact Sheet for Healthcare Providers: GravelBags.it  This test is not yet approved or cleared by the [place] FDA and  has been authorized for detection and/or diagnosis of SARS-CoV-2 by  FDA under an Emergency Use Authorization (EUA). This EUA will remain  in effect (meaning this test can be used) for the duration of the  COVID-19 declaration under Section 564(b)(1) of the Act, 21 U.S.C.  section 360bbb-3(b)(1), unless the authorization is  terminated or  revoked. Performed at [facility], [place] [address] [place], [place] [address]   MRSA PCR Screening     Status: Abnormal   Collection Time: 06/03/20  2:00 PM   Specimen: Nasal Mucosa; Nasopharyngeal  Result Value Ref Range Status   MRSA by PCR POSITIVE (A) NEGATIVE Final    Comment:        The GeneXpert MRSA Assay (FDA approved for NASAL specimens only), is one component of a comprehensive MRSA colonization surveillance program. It is not intended to diagnose MRSA infection nor to guide or monitor treatment for MRSA infections. RESULT CALLED TO, READ BACK BY AND VERIFIED WITH: HEAVNER,L. RN @1848  06/03/20 BILLINGSLEY,L Performed at [facility], [place] [address] [place], [place] [address]      Radiology Studies: CT TIBIA FIBULA LEFT WO CONTRAST  Result Date: 06/05/2020 CLINICAL DATA:  Open wounds involving the right lower extremity and diffuse soft tissue swelling, redness and pain. EXAM: CT OF THE LOWER LEFT EXTREMITY WITHOUT CONTRAST TECHNIQUE: Multidetector CT imaging of the lower left extremity was performed according to the standard protocol. COMPARISON:  None. FINDINGS: There are open wounds noted involving the anterior aspect of the left lower extremity. There is a fairly deep 3.7 cm long soft tissue defect involving the anterior aspect at the mid tibial level. Slightly more inferiorly and medially there is a second lesion measuring 18 mm. Diffuse and fairly marked skin thickening, subcutaneous soft tissue swelling/edema/fluid consistent with cellulitis. I do not see a discrete fluid collection to suggest a drainable soft tissue abscess. Although difficult with CT I believe there is also diffuse myofasciitis without obvious findings for pyomyositis. No findings suspicious for septic arthritis at the knee or ankle joints. Extensive vascular calcifications are noted. IMPRESSION: 1. Open wounds involving the anterior aspect of the  left lower extremity with 2 areas of deep ulceration as described above. 2. Diffuse and fairly marked skin thickening, subcutaneous soft tissue swelling/edema/fluid consistent with cellulitis. 3. No discrete fluid collection to suggest a drainable soft tissue abscess. 4. Suspect diffuse myofasciitis without obvious findings for pyomyositis. 5. No findings suspicious for septic arthritis at the knee or ankle joints. 6. Extensive vascular calcifications. Electronically Signed   By: Marijo Sanes M.D.   On: 06/05/2020 14:47    Scheduled Meds: . acetaminophen  650 mg Oral Q6H  . amLODipine  5 mg Oral Daily  . Chlorhexidine Gluconate Cloth  6 each Topical Q0600  . collagenase   Topical Daily  . furosemide  80 mg Intravenous Q12H  . insulin aspart  0-9 Units Subcutaneous TID WC  . insulin glargine  16 Units Subcutaneous Daily  . linezolid  600 mg Oral Q12H  . metoprolol tartrate  25 mg Oral BID  . mupirocin ointment  1 application Nasal BID  . oxyCODONE  10 mg Oral Q12H  . predniSONE  5 mg Oral QAC breakfast  . sodium chloride flush  3 mL Intravenous Q12H  . tacrolimus  3 mg Oral BID  . [START ON 06/07/2020] torsemide  100 mg Oral Daily   Continuous Infusions: . heparin 1,500 Units/hr (06/06/20 0928)     LOS: 3 days   Marylu Lund, MD [facility] Pager On Amion  If 7PM-7AM, please contact night-coverage 06/06/2020, 4:41 PM

## 2020-06-06 NOTE — Progress Notes (Signed)
[place] for heparin Indication: hx bioprosthetic AVR, stroke, afib  Allergies  Allergen Reactions  . Ibuprofen Nausea And Vomiting  . Sulfamethoxazole-Trimethoprim Itching, Swelling and Rash    Swelling of the face  . Sulfonamide Derivatives Itching, Swelling and Rash    Swelling of the face  . Tape Rash    Paper tape is ok  . Tramadol Nausea And Vomiting  . Doxycycline Nausea Only  . Hydrocil [Psyllium] Nausea And Vomiting  . Bactrim Itching, Swelling and Rash  . Red Dye Itching and Rash    Patient Measurements: Height: 5\' 1"  (154.9 cm) Weight: 85.1 kg (187 lb 9.8 oz) IBW/kg (Calculated) : 47.8 Heparin Dosing Weight: 82 kg  Vital Signs: Temp: 98.1 F (36.7 C) (11/19 0353) Temp Source: Oral (11/18 1941) BP: 129/67 (11/19 0353) Pulse Rate: 94 (11/19 0353)  Labs: Recent Labs    06/03/20 0619 06/03/20 0815 06/03/20 1008 06/03/20 1009 06/03/20 1730 06/04/20 0423 06/04/20 1259 06/05/20 0034 06/05/20 0934 06/05/20 1159 06/06/20 0440  HGB  --   --   --   --    < > 12.0  --  12.4  --   --  11.6*  HCT  --   --   --   --   --  39.5  --  42.2  --   --  39.2  PLT  --   --   --   --   --  168  --  168  --   --  186  APTT  --   --  32  --   --   --   --   --   --   --   --   LABPROT  --   --   --   --   --  16.6*  --   --   --   --   --   INR  --   --   --   --   --  1.4*  --   --   --   --   --   HEPARINUNFRC  --   --   --   --    < > 0.26*   < > 0.30 0.40  --  0.37  CREATININE  --   --   --   --   --  2.58*  --  2.65*  --   --   --   CKTOTAL  --   --   --   --   --   --   --   --   --  98  --   TROPONINIHS 161* 151*  --  174*  --   --   --   --   --   --   --    < > = values in this interval not displayed.    Estimated Creatinine Clearance: 17.9 mL/min (A) (by C-G formula based on SCr of 2.65 mg/dL (H)).   Medications:  - on warfarin PTA  Assessment: Patient is a 74 y.o F with hx bioprosthetic AVR, stroke, and afib on  warfarin PTA presented to the ED on 11/16 with fever. Pharmacy is consulted to transition to heparin drip on admission.  Today, 06/06/2020: -HL at 0934 = 0.37 = therapeutic on heparin at 1500 units/hr - Hgb 11.6, Plts WNL - Scr 2.65 -no bleeding reported  Goal of Therapy:  Heparin level 0.3-0.7 units/ml Monitor platelets by anticoagulation  protocol: Yes   Plan:  - continue heparin drip at 1500 units/hr - Daily heparin level & CBC - monitor for s/s bleeding  Dolly Rias RPh 06/06/2020, 5:14 AM

## 2020-06-06 NOTE — Consult Note (Signed)
   [facility] CM Inpatient Consult   06/06/2020  [name] 03/23/1946 [id number]   Patient chart reviewed for potential [facility] CM) services due to high unplanned readmission risk score, 25%.  Per review, current disposition recommendation plan is for SNF. No [facility] CM needs.  [name], MSN, [facility] Liaison Nurse Mobile Phone [phone]  Toll free office [phone]

## 2020-06-06 NOTE — Care Management Important Message (Signed)
Important Message  Patient Details IM Letter given to the Patient. Name: [name] MRN: [id number] Date of Birth: 1945/10/18   Medicare Important Message Given:  Yes     Kerin Salen 06/06/2020, 11:15 AM

## 2020-06-06 NOTE — Plan of Care (Signed)

## 2020-06-07 ENCOUNTER — Inpatient Hospital Stay (HOSPITAL_COMMUNITY): Payer: Medicare Other

## 2020-06-07 DIAGNOSIS — M7989 Other specified soft tissue disorders: Secondary | ICD-10-CM | POA: Diagnosis not present

## 2020-06-07 DIAGNOSIS — I5033 Acute on chronic diastolic (congestive) heart failure: Secondary | ICD-10-CM | POA: Diagnosis not present

## 2020-06-07 DIAGNOSIS — Z953 Presence of xenogenic heart valve: Secondary | ICD-10-CM | POA: Diagnosis not present

## 2020-06-07 DIAGNOSIS — N186 End stage renal disease: Secondary | ICD-10-CM | POA: Diagnosis not present

## 2020-06-07 DIAGNOSIS — L03116 Cellulitis of left lower limb: Secondary | ICD-10-CM | POA: Diagnosis not present

## 2020-06-07 DIAGNOSIS — M79605 Pain in left leg: Secondary | ICD-10-CM

## 2020-06-07 DIAGNOSIS — I4891 Unspecified atrial fibrillation: Secondary | ICD-10-CM | POA: Diagnosis not present

## 2020-06-07 DIAGNOSIS — L03115 Cellulitis of right lower limb: Secondary | ICD-10-CM | POA: Diagnosis not present

## 2020-06-07 LAB — BASIC METABOLIC PANEL
Anion gap: 12 (ref 5–15)
BUN: 41 mg/dL — ABNORMAL HIGH (ref 8–23)
CO2: 33 mmol/L — ABNORMAL HIGH (ref 22–32)
Calcium: 9.4 mg/dL (ref 8.9–10.3)
Chloride: 96 mmol/L — ABNORMAL LOW (ref 98–111)
Creatinine, Ser: 2.48 mg/dL — ABNORMAL HIGH (ref 0.44–1.00)
GFR, Estimated: 20 mL/min — ABNORMAL LOW (ref >60)
Glucose, Bld: 81 mg/dL (ref 70–99)
Potassium: 2.9 mmol/L — ABNORMAL LOW (ref 3.5–5.1)
Sodium: 141 mmol/L (ref 135–145)

## 2020-06-07 LAB — CBC
HCT: 37.5 % (ref 36.0–46.0)
Hemoglobin: 11.1 g/dL — ABNORMAL LOW (ref 12.0–15.0)
MCH: 23.6 pg — ABNORMAL LOW (ref 26.0–34.0)
MCHC: 29.6 g/dL — ABNORMAL LOW (ref 30.0–36.0)
MCV: 79.6 fL — ABNORMAL LOW (ref 80.0–100.0)
Platelets: 193 10*3/uL (ref 150–400)
RBC: 4.71 MIL/uL (ref 3.87–5.11)
RDW: 20.2 % — ABNORMAL HIGH (ref 11.5–15.5)
WBC: 13.3 10*3/uL — ABNORMAL HIGH (ref 4.0–10.5)
nRBC: 0 % (ref 0.0–0.2)

## 2020-06-07 LAB — GLUCOSE, CAPILLARY
Glucose-Capillary: 73 mg/dL (ref 70–99)
Glucose-Capillary: 64 mg/dL — ABNORMAL LOW (ref 70–99)
Glucose-Capillary: 97 mg/dL (ref 70–99)
Glucose-Capillary: 93 mg/dL (ref 70–99)

## 2020-06-07 LAB — HEPARIN LEVEL (UNFRACTIONATED)
Heparin Unfractionated: 0.2 IU/mL — ABNORMAL LOW (ref 0.30–0.70)
Heparin Unfractionated: 0.43 IU/mL (ref 0.30–0.70)

## 2020-06-07 MED ORDER — DEXTROSE 50 % IV SOLN
INTRAVENOUS | Status: AC
  Administered 2020-06-07: 50 mL
  Filled 2020-06-07: qty 50

## 2020-06-07 MED ORDER — POTASSIUM CHLORIDE 10 MEQ/100ML IV SOLN
10.0000 meq | INTRAVENOUS | Status: AC
  Administered 2020-06-07 (×6): 10 meq via INTRAVENOUS
  Filled 2020-06-07 (×6): qty 100

## 2020-06-07 MED ORDER — DEXTROSE 10 % IV SOLN: INTRAVENOUS | Status: DC

## 2020-06-07 MED ORDER — HEPARIN (PORCINE) 25000 UT/250ML-% IV SOLN
1650.0000 [IU]/h | INTRAVENOUS | Status: DC
  Administered 2020-06-07 – 2020-06-10 (×5): 1650 [IU]/h via INTRAVENOUS
  Filled 2020-06-07 (×5): qty 250

## 2020-06-07 MED ORDER — DEXTROSE 50 % IV SOLN: 50.0000 mL | Freq: Once | INTRAVENOUS | Status: AC

## 2020-06-07 MED ORDER — SODIUM CHLORIDE 0.9 % IV SOLN: INTRAVENOUS | Status: DC

## 2020-06-07 MED ORDER — FENTANYL 25 MCG/HR TD PT72
1.0000 | MEDICATED_PATCH | TRANSDERMAL | Status: DC
  Administered 2020-06-07 – 2020-06-13 (×3): 1 via TRANSDERMAL
  Filled 2020-06-07 (×3): qty 1

## 2020-06-07 NOTE — Progress Notes (Signed)
Progress Note  Patient Name: Sammi Stolarz Date of Encounter: 06/07/2020  [facility] Cardiologist: [name], MD   Subjective   Appears uncomfortable.  BP stable 125/63.  SPO2 96% on 2 L.  Renal function stable (2.6 > 2.7 > 2.6. 2.5.  Potassium down to 2.9 this morning.  Net negative 600cc yesterday on IV lasix 80 mg BID.  Switch to torsemide 100 mg daily today  Inpatient Medications    Scheduled Meds: . acetaminophen  650 mg Oral Q6H  . amLODipine  5 mg Oral Daily  . Chlorhexidine Gluconate Cloth  6 each Topical Q0600  . collagenase   Topical Daily  . insulin aspart  0-9 Units Subcutaneous TID WC  . insulin glargine  16 Units Subcutaneous Daily  . linezolid  600 mg Oral Q12H  . metoprolol tartrate  25 mg Oral BID  . mupirocin ointment  1 application Nasal BID  . oxyCODONE  10 mg Oral Q12H  . predniSONE  5 mg Oral QAC breakfast  . sodium chloride flush  3 mL Intravenous Q12H  . tacrolimus  3 mg Oral BID  . torsemide  100 mg Oral Daily   Continuous Infusions: . heparin 1,650 Units/hr (06/07/20 0746)  . potassium chloride 10 mEq (06/07/20 0906)   PRN Meds: HYDROmorphone (DILAUDID) injection, oxyCODONE, polyethylene glycol   Vital Signs    Vitals:   06/07/20 0432 06/07/20 0500 06/07/20 0908 06/07/20 0912  BP: (!) 117/59  125/63 125/63  Pulse: 83  85 85  Resp: 18     Temp: 97.9 F (36.6 C)     TempSrc: Oral     SpO2: (!) 86%   96%  Weight:  80.8 kg    Height:        Intake/Output Summary (Last 24 hours) at 06/07/2020 0947 Last data filed at 06/07/2020 0600 Gross per 24 hour  Intake 396.32 ml  Output 702 ml  Net -305.68 ml   Last 3 Weights 06/07/2020 06/05/2020 06/04/2020  Weight (lbs) 178 lb 2.1 oz 187 lb 9.8 oz 187 lb 6.3 oz  Weight (kg) 80.8 kg 85.1 kg 85 kg      Telemetry    Atrial fibrillation.  Rate 80s.   - Personally Reviewed  ECG    n/a - Personally Reviewed  Physical Exam   VS:  BP 125/63   Pulse 85   Temp 97.9 F (36.6 C)  (Oral)   Resp 18   Ht 5\' 1"  (1.549 m)   Wt 80.8 kg   SpO2 96%   BMI 33.66 kg/m  , BMI Body mass index is 33.66 kg/m. GENERAL:  Clearly in pain HEENT: Pupils equal round and reactive, fundi not visualized, oral mucosa unremarkable NECK:  JVP to clavicle at 45 degrees, waveform within normal limits, carotid upstroke brisk and symmetric, no bruits LUNGS:  Clear to auscultation bilaterally HEART:  RRR.  PMI not displaced or sustained, S1 and S2 within normal limits, no S3, no S4, no clicks, no rubs, no murmurs ABD:  Flat, positive bowel sounds normal in frequency in pitch, no bruits, no rebound, no guarding, no midline pulsatile mass, no hepatomegaly, no splenomegaly EXT:  1+ R LE edema.  L LE wrapped.  2+ pedal edema on L, no cyanosis no clubbing SKIN:  No rashes no nodules NEURO:  Cranial nerves II through XII grossly intact, motor grossly intact throughout PSYCH:  Cognitively intact, oriented to person place and time   Labs    High Sensitivity Troponin:  Recent Labs  Lab 06/03/20 0619 06/03/20 0815 06/03/20 1009  TROPONINIHS 161* 151* 174*      Chemistry Recent Labs  Lab 06/03/20 0407 06/04/20 0423 06/05/20 0034 06/06/20 0440 06/07/20 0450  NA 141   < > 136 139 141  K 4.6   < > 5.9* 3.2* 2.9*  CL 100   < > 95* 95* 96*  CO2 30   < > 28 32 33*  GLUCOSE 239*   < > 123* 128* 81  BUN 61*   < > 46* 44* 41*  CREATININE 2.92*   < > 2.65* 2.64* 2.48*  CALCIUM 10.2   < > 9.1 9.4 9.4  PROT 7.2  --   --   --   --   ALBUMIN 2.8*  --   --   --   --   AST 23  --   --   --   --   ALT 15  --   --   --   --   ALKPHOS 136*  --   --   --   --   BILITOT 1.1  --   --   --   --   GFRNONAA 16*   < > 18* 18* 20*  ANIONGAP 11   < > 13 12 12    < > = values in this interval not displayed.     Hematology Recent Labs  Lab 06/05/20 0034 06/06/20 0440 06/07/20 0450  WBC 14.5* 13.4* 13.3*  RBC 5.19* 4.85 4.71  HGB 12.4 11.6* 11.1*  HCT 42.2 39.2 37.5  MCV 81.3 80.8 79.6*  MCH  23.9* 23.9* 23.6*  MCHC 29.4* 29.6* 29.6*  RDW 21.4* 20.6* 20.2*  PLT 168 186 193    BNP Recent Labs  Lab 06/03/20 0619  BNP 1,883.6*     DDimer No results for input(s): DDIMER in the last 168 hours.   Radiology    CT TIBIA FIBULA LEFT WO CONTRAST  Result Date: 06/05/2020 CLINICAL DATA:  Open wounds involving the right lower extremity and diffuse soft tissue swelling, redness and pain. EXAM: CT OF THE LOWER LEFT EXTREMITY WITHOUT CONTRAST TECHNIQUE: Multidetector CT imaging of the lower left extremity was performed according to the standard protocol. COMPARISON:  None. FINDINGS: There are open wounds noted involving the anterior aspect of the left lower extremity. There is a fairly deep 3.7 cm long soft tissue defect involving the anterior aspect at the mid tibial level. Slightly more inferiorly and medially there is a second lesion measuring 18 mm. Diffuse and fairly marked skin thickening, subcutaneous soft tissue swelling/edema/fluid consistent with cellulitis. I do not see a discrete fluid collection to suggest a drainable soft tissue abscess. Although difficult with CT I believe there is also diffuse myofasciitis without obvious findings for pyomyositis. No findings suspicious for septic arthritis at the knee or ankle joints. Extensive vascular calcifications are noted. IMPRESSION: 1. Open wounds involving the anterior aspect of the left lower extremity with 2 areas of deep ulceration as described above. 2. Diffuse and fairly marked skin thickening, subcutaneous soft tissue swelling/edema/fluid consistent with cellulitis. 3. No discrete fluid collection to suggest a drainable soft tissue abscess. 4. Suspect diffuse myofasciitis without obvious findings for pyomyositis. 5. No findings suspicious for septic arthritis at the knee or ankle joints. 6. Extensive vascular calcifications. Electronically Signed   By: Marijo Sanes M.D.   On: 06/05/2020 14:47    Cardiac Studies   Echo  06/03/20: 1. Left ventricular ejection fraction, by  estimation, is 55 to 60%. The  left ventricle has normal function. The left ventricle demonstrates  regional wall motion abnormalities (see scoring diagram/findings for  description). There is mild left ventricular  hypertrophy. Left ventricular diastolic parameters are indeterminate.  2. Right ventricular systolic function is moderately reduced. The right  ventricular size is moderately enlarged. There is severely elevated  pulmonary artery systolic pressure. The estimated right ventricular  systolic pressure is 14.4 mmHg.  3. Left atrial size was severely dilated.  4. Right atrial size was moderately dilated.  5. Suspect small flail segment on anterior mitral leaflet causing very  eccentric posteriorly directed mitral valve regurgitation that appears at  least moderate-severe. From short axis view may involve A3 scallop.. The  mitral valve is abnormal. Moderate  to severe mitral valve regurgitation. Severe mitral annular calcification.  6. Tricuspid valve regurgitation is severe.  7. The aortic valve has been repaired/replaced. Aortic valve  regurgitation is not visualized. There is a 23 mm Edwards bovine valve  present in the aortic position. Procedure Date: 04/07/2013. Echo findings  are consistent with normal structure and  function of the aortic valve prosthesis. Aortic valve mean gradient  measures 18.2 mmHg.  8. The inferior vena cava is dilated in size with <50% respiratory  variability, suggesting right atrial pressure of 15 mmHg.   Patient Profile     74 y.o. female with CAD s/p PCI, AS s/p bioprosthetic AVR, ESRD status post renal transplant, CKD 4, severe pulmonary hypertensin, embolic stroke with expressive aphasia, diabetes, hypertension, hyperlipidemia, OSA, COPD, atrial fibrillation status post Maze procedure, chronic diastolic heart failure, PAD, and lung adenocarcinoma that is untreated admitted with lower  extremity ulcers, sepsis secondary to cellulitis, and atrial fibrillation with rapid ventricular response.    Assessment & Plan    # Afib with RVR: Rates are now well-controlled on metoprolol.  On IV heparin and warfarin on hold.  Resume once it is clear she won't need procedures.   # Acute on chronic diastolic heart failure: # Severe TR: # moderate to severe MR:  Flail mitral valve leaflet with moderate to severe TR.  Diuresis for now.  Given that she has untreated malignancy, suspect she would not be a candidate for MitraClip.  She wouldn't be a candidate for open repair.  Continue to manage with diuretics.  Continue PO torsemide  # Hypertension:  Continue amlodipine and metoprolol.    # CAD s/p PCI: # PAD:  Diffuse calcification noted on CT.  Not on aspirin presumably b/c she is on warfarin at baseline.  LDL 32, not on statin  # CKD IV:  # S/p renal transplant: Renal function stable with diuresis.  # Sepsis 2/2 cellulitis: CT showed diffuse myofasciitis.  Antibiotics per IM.    # s/p AVR:  Bioprosthetic AV.    # Hypokalemia: K 2.9, replete per primary team      For questions or updates, please contact [place] Please consult [email or web address] for contact info under        Signed, Donato Heinz, MD  06/07/2020, 9:47 AM

## 2020-06-07 NOTE — Progress Notes (Signed)
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 Apparently per EMS, the patient originally had called out for a nosebleed but upon arrival the patient was found to be febrile, tachypneic and tachycardic with difficulty clearing secretions.   Assessment & Plan:   Principal Problem:   Sepsis ([place]) Active Problems:   Type 2 diabetes mellitus with chronic kidney disease, with long-term current use of insulin (HCC)   Hyperlipidemia LDL goal <70   Renal transplant recipient   S/P aortic valve replacement with bioprosthetic valve and maze procedure   Acute on chronic diastolic CHF (congestive heart failure) (HCC)   History of stroke   Venous stasis ulcers of both lower extremities (HCC)   ESRD (end stage renal disease) (HCC)   Atrial fibrillation with rapid ventricular response ([place])   Pulmonary hypertension, unspecified ([place])   1. Sepsis without septic shock, from suspected LLE purulent cellulitis and Proteus UTI a. Presented febrile, tachycardic, tachypneic, leukocytosis b. Chronic bilateral lower extremity wounds, with concerns for active cellulitis c. Has a history of aortic stenosis s/p AVR with a heart failure exacerbation -> echo reviewed with no mention of vegetation d. Blood cultures neg e. Holding IV fluids due to heart failure f. Was on empiric vancomycin and meropenem, now narrowed to zyvox g. Palliative care consulted at time of  presentation for GOC h. Pt complained of LLE pain. CT LLE ordered and reviewed. Findings of open ulceration with celluliits and myofasciitis. Cont on abx  i. Chart reviewed. Pt has hx of non-operative peripheral vascular disease with non-healing wounds. Per Vascular Surgeon, if wound pain becomes too unbearable, then amputation would be the next consideration. Discussed with patient's husband who reports LE pain has been chronic j. Appreciate assistance by palliative care regarding pain management. Plan to focus on better pain management per Palliative Care  2. Atrial fibrillation with RVR with History of MAZE procedure a. On Coumadin and beta-blocker outpatient b. INR 1.5 on most recent check c. Was initially placed on cardizem gtt, now transitioned to metoprolol per Cardiology, rate controlled. Appreciate assistance by Cardiology d. Continued on Heparin drip at this time  3. Acute on chronic heart failure exacerbation, suspect diastolic a. Echo 7/61/9509: EF 60 to 65% b. Received Lasix 80 mg IV x1, now on torsemide c. Likely exacerbated by A. fib d. Cardiology following, appreciate assistance  4. Acute on chronic hypoxemic respiratory failure likely from CHF a. Initially on room air then SpO2 dropped to 84%, currently on 2 LPM b. Has intermittent O2 requirement at baseline c. pO2 27 on VBG d. Continued on [facility] e. Recheck bmet in AM  5. Abdominal pain in the setting of recent abdominal aortogram for chronic wounds a. CT abdomen pelvis without contrast noted to have new body wall edema otherwise no acute findings  6. ESRD s/p renal transplant, now CKD IV on immunosuppression a. Earlier discussed case with Nephrology. Recommendation to continue tacrolimus and prednisone b. Repeat bmet in AM  7. Severe aortic stenosis s/p AVR a. Was on  coumadin prior to admit, now on heparin gtt b. INR most recently 1.5 c. Cardiology consulted, appreciate assistance  8. Hypertension a. Now  off cardizem gtt and on PO metoprolol b. Cont on norvasc c. BP stable at this time  9. Hyperlipidemia a. Cont diet as tolerate  10. CAD s/p stent a. On coumadin and statin prior to admit b. Currently on heparin gtt per above  11. Embolic CVA with residual expressive aphasia a. Subtherapeutic INR -> CT head -> prior infarct at the junction of the left temporal parietal and occipital lobes, no acute infarct b. Earlier passed SLP eval however pt currently not tolerating PO intake or meds c. Have placed nutrition consult. Concern that if patient is not taking adequate PO, if alternative means of nutrition may be warranted d. Palliative Care following  12. Diabetes a. Pt had been continued on Lantus 20 u daily with sliding scale, now on 16 units lantus b. Pt has not been tolerating much PO at all with glucose down to 60's this afternoon c. Will hold further lantus and cont pt on D10 until glucose stabilizes  13. Chronic bilateral lower extremity wounds a. WOC eval and recs noted  14. L Arm Swelling -LUE doppler reviewed, neg for DVT   DVT prophylaxis: Heparin gtt Code Status: Full Family Communication: Pt in room, Discussed with pt's husband at bedside 11/19  Status is: Inpatient  Remains inpatient appropriate because:Ongoing active pain requiring inpatient pain management and Unsafe d/c plan   Dispo: The patient is from: Home              Anticipated d/c is to: SNF              Anticipated d/c date is: > 3 days              Patient currently is not medically stable to d/c.    Consultants:   Cardiology  Discussed case with Nephrology  Palliative Care  Procedures:     Antimicrobials: Anti-infectives (From admission, onward)   Start     Dose/Rate Route Frequency Ordered Stop   06/06/20 2200  linezolid (ZYVOX) tablet 600 mg        600 mg Oral Every 12 hours 06/06/20 1136     06/05/20 2200  linezolid (ZYVOX) IVPB 600 mg  Status:  Discontinued        600  mg 300 mL/hr over 60 Minutes Intravenous Every 12 hours 06/05/20 1814 06/06/20 1136   06/05/20 1400  ceFAZolin (ANCEF) IVPB 1 g/50 mL premix  Status:  Discontinued        1 g 100 mL/hr over 30 Minutes Intravenous Every 8 hours 06/05/20 0931 06/05/20 1006   06/05/20 1100  ceFAZolin (ANCEF) IVPB 1 g/50 mL premix  Status:  Discontinued        1 g 100 mL/hr over 30 Minutes Intravenous Every 12 hours 06/05/20 1006 06/05/20 1811   06/04/20 2200  cefTRIAXone (ROCEPHIN) 2 g in sodium chloride 0.9 % 100 mL IVPB  Status:  Discontinued        2 g 200 mL/hr over 30 Minutes Intravenous Every 24 hours 06/04/20 1315 06/05/20 0931   06/04/20 1415  linezolid (ZYVOX) IVPB 600 mg  Status:  Discontinued        600 mg 300 mL/hr over 60 Minutes Intravenous Every 12 hours 06/04/20 1315 06/05/20 0931   06/04/20 1415  metroNIDAZOLE (FLAGYL) tablet 500 mg  Status:  Discontinued  500 mg Oral Every 8 hours 06/04/20 1315 06/05/20 0931   06/03/20 1800  meropenem (MERREM) 500 mg in sodium chloride 0.9 % 100 mL IVPB  Status:  Discontinued        500 mg 200 mL/hr over 30 Minutes Intravenous Every 12 hours 06/03/20 0907 06/04/20 1315   06/03/20 0909  vancomycin variable dose per unstable renal function (pharmacist dosing)  Status:  Discontinued         Does not apply See admin instructions 06/03/20 0909 06/04/20 1315   06/03/20 0430  ceFEPIme (MAXIPIME) 2 g in sodium chloride 0.9 % 100 mL IVPB        2 g 200 mL/hr over 30 Minutes Intravenous  Once 06/03/20 0423 06/03/20 0617   06/03/20 0430  piperacillin-tazobactam (ZOSYN) IVPB 3.375 g        3.375 g 12.5 mL/hr over 240 Minutes Intravenous  Once 06/03/20 0423 06/03/20 0909   06/03/20 0430  vancomycin (VANCOCIN) IVPB 1000 mg/200 mL premix        1,000 mg 200 mL/hr over 60 Minutes Intravenous  Once 06/03/20 0423 06/03/20 0617      Subjective: Unable to verbalize this AM  Objective: Vitals:   06/07/20 0908 06/07/20 0912 06/07/20 1327 06/07/20 1425  BP:  125/63 125/63 (!) 191/100 (!) 146/93  Pulse: 85 85 78 86  Resp:   15 16  Temp:   97.7 F (36.5 C) 98.4 F (36.9 C)  TempSrc:   Oral Oral  SpO2:  96% 100% 93%  Weight:      Height:        Intake/Output Summary (Last 24 hours) at 06/07/2020 1743 Last data filed at 06/07/2020 1215 Gross per 24 hour  Intake 446.32 ml  Output 102 ml  Net 344.32 ml   Filed Weights   06/04/20 0852 06/05/20 0527 06/07/20 0500  Weight: 85 kg 85.1 kg 80.8 kg    Examination: General exam: Awake, laying in bed, in nad, appears to be in pain Respiratory system: Normal respiratory effort, no wheezing Cardiovascular system: regular rate, s1, s2 Gastrointestinal system: Soft, nondistended, positive BS Central nervous system: CN2-12 grossly intact, strength intact Extremities: Perfused, no clubbing Skin: Normal skin turgor, no rashes Psychiatry: difficult to assess given mentation    Data Reviewed: I have personally reviewed following labs and imaging studies  CBC: Recent Labs  Lab 06/03/20 0407 06/04/20 0423 06/05/20 0034 06/06/20 0440 06/07/20 0450  WBC 14.2* 16.0* 14.5* 13.4* 13.3*  NEUTROABS 12.3*  --   --   --   --   HGB 12.5 12.0 12.4 11.6* 11.1*  HCT 41.7 39.5 42.2 39.2 37.5  MCV 80.0 79.6* 81.3 80.8 79.6*  PLT 199 168 168 186 875   Basic Metabolic Panel: Recent Labs  Lab 06/03/20 0407 06/04/20 0423 06/05/20 0034 06/06/20 0440 06/07/20 0450  NA 141 138 136 139 141  K 4.6 3.8 5.9* 3.2* 2.9*  CL 100 101 95* 95* 96*  CO2 30 28 28  32 33*  GLUCOSE 239* 113* 123* 128* 81  BUN 61* 52* 46* 44* 41*  CREATININE 2.92* 2.58* 2.65* 2.64* 2.48*  CALCIUM 10.2 9.7 9.1 9.4 9.4   GFR: Estimated Creatinine Clearance: 18.6 mL/min (A) (by C-G formula based on SCr of 2.48 mg/dL (H)). Liver Function Tests: Recent Labs  Lab 06/03/20 0407  AST 23  ALT 15  ALKPHOS 136*  BILITOT 1.1  PROT 7.2  ALBUMIN 2.8*   No results for input(s): LIPASE, AMYLASE in the last 168 hours. No  results for  input(s): AMMONIA in the last 168 hours. Coagulation Profile: Recent Labs  Lab 06/03/20 0407 06/04/20 0423  INR 1.3* 1.4*   Cardiac Enzymes: Recent Labs  Lab 06/05/20 1159  CKTOTAL 98   BNP (last 3 results) No results for input(s): PROBNP in the last 8760 hours. HbA1C: No results for input(s): HGBA1C in the last 72 hours. CBG: Recent Labs  Lab 06/06/20 1631 06/06/20 2119 06/06/20 2207 06/07/20 1122 06/07/20 1700  GLUCAP 81 63* 87 73 64*   Lipid Profile: Recent Labs    06/06/20 0440  CHOL 90  HDL 50  LDLCALC 32  TRIG 39  CHOLHDL 1.8   Thyroid Function Tests: No results for input(s): TSH, T4TOTAL, FREET4, T3FREE, THYROIDAB in the last 72 hours. Anemia Panel: No results for input(s): VITAMINB12, FOLATE, FERRITIN, TIBC, IRON, RETICCTPCT in the last 72 hours. Sepsis Labs: Recent Labs  Lab 06/03/20 0407 06/03/20 0619  LATICACIDVEN 1.7 1.5    Recent Results (from the past 240 hour(s))  SARS CORONAVIRUS 2 (TAT 6-24 HRS) Nasopharyngeal Nasopharyngeal Swab     Status: None   Collection Time: 05/29/20 11:16 AM   Specimen: Nasopharyngeal Swab  Result Value Ref Range Status   SARS Coronavirus 2 NEGATIVE NEGATIVE Final    Comment: (NOTE) SARS-CoV-2 target nucleic acids are NOT DETECTED.  The SARS-CoV-2 RNA is generally detectable in upper and lower respiratory specimens during the acute phase of infection. Negative results do not preclude SARS-CoV-2 infection, do not rule out co-infections with other pathogens, and should not be used as the sole basis for treatment or other patient management decisions. Negative results must be combined with clinical observations, patient history, and epidemiological information. The expected result is Negative.  Fact Sheet for Patients: SugarRoll.be  Fact Sheet for Healthcare Providers: https://www.woods-mathews.com/  This test is not yet approved or cleared by the [place] FDA  and  has been authorized for detection and/or diagnosis of SARS-CoV-2 by FDA under an Emergency Use Authorization (EUA). This EUA will remain  in effect (meaning this test can be used) for the duration of the COVID-19 declaration under Se ction 564(b)(1) of the Act, 21 U.S.C. section 360bbb-3(b)(1), unless the authorization is terminated or revoked sooner.  Performed at [facility], [place] [address], [place], [place] [address]   Culture, blood (Routine x 2)     Status: None (Preliminary result)   Collection Time: 06/03/20  4:07 AM   Specimen: BLOOD  Result Value Ref Range Status   Specimen Description   Final    BLOOD RIGHT ANTECUBITAL Performed at [facility] [address] [place], [place] [address]    Special Requests   Final    BOTTLES DRAWN AEROBIC AND ANAEROBIC Blood Culture adequate volume Performed at [facility] [address], [place], [place] [address]    Culture   Final    NO GROWTH 4 DAYS Performed at [facility], [place] [address], [place], [place] [address]    Report Status PENDING  Incomplete  Culture, blood (Routine x 2)     Status: None (Preliminary result)   Collection Time: 06/03/20  4:07 AM   Specimen: BLOOD RIGHT HAND  Result Value Ref Range Status   Specimen Description   Final    BLOOD RIGHT HAND Performed at [facility] [address], [place], [place] [address]    Special Requests   Final    BOTTLES DRAWN AEROBIC AND ANAEROBIC Blood Culture results  may not be optimal due to an inadequate volume of blood received in culture bottles Performed at [facility] [address] [place], [place] [address]    Culture   Final    NO GROWTH 4 DAYS Performed at [facility], [place] [address] [place], [place] [address]    Report Status PENDING  Incomplete  Urine culture     Status: Abnormal   Collection Time: 06/03/20  4:29 AM   Specimen: In/Out Cath Urine    Result Value Ref Range Status   Specimen Description   Final    IN/OUT CATH URINE Performed at [facility] [address] [place], [place] [address]    Special Requests   Final    NONE Performed at [facility], [place] [address] [place], [place] [address]    Culture 10,000 COLONIES/mL PROTEUS MIRABILIS (A)  Final   Report Status 06/05/2020 FINAL  Final   Organism ID, Bacteria PROTEUS MIRABILIS (A)  Final      Susceptibility   Proteus mirabilis - MIC*    AMPICILLIN <=2 SENSITIVE Sensitive     CEFAZOLIN <=4 SENSITIVE Sensitive     CEFEPIME <=0.12 SENSITIVE Sensitive     CEFTRIAXONE <=0.25 SENSITIVE Sensitive     CIPROFLOXACIN <=0.25 SENSITIVE Sensitive     GENTAMICIN <=1 SENSITIVE Sensitive     IMIPENEM 8 INTERMEDIATE Intermediate     NITROFURANTOIN 128 RESISTANT Resistant     TRIMETH/SULFA <=20 SENSITIVE Sensitive     AMPICILLIN/SULBACTAM <=2 SENSITIVE Sensitive     PIP/TAZO <=4 SENSITIVE Sensitive     * 10,000 COLONIES/mL PROTEUS MIRABILIS  Resp Panel by RT PCR (RSV, Flu A&B, Covid) - Nasopharyngeal Swab     Status: None   Collection Time: 06/03/20  8:14 AM   Specimen: Nasopharyngeal Swab  Result Value Ref Range Status   SARS Coronavirus 2 by RT PCR NEGATIVE NEGATIVE Final    Comment: (NOTE) SARS-CoV-2 target nucleic acids are NOT DETECTED.  The SARS-CoV-2 RNA is generally detectable in upper respiratoy specimens during the acute phase of infection. The lowest concentration of SARS-CoV-2 viral copies this assay can detect is 131 copies/mL. A negative result does not preclude SARS-Cov-2 infection and should not be used as the sole basis for treatment or other patient management decisions. A negative result may occur with  improper specimen collection/handling, submission of specimen other than nasopharyngeal swab, presence of viral mutation(s) within the areas targeted by this assay, and inadequate number of viral copies (<131  copies/mL). A negative result must be combined with clinical observations, patient history, and epidemiological information. The expected result is Negative.  Fact Sheet for Patients:  PinkCheek.be  Fact Sheet for Healthcare Providers:  GravelBags.it  This test is no t yet approved or cleared by the [place] FDA and  has been authorized for detection and/or diagnosis of SARS-CoV-2 by FDA under an Emergency Use Authorization (EUA). This EUA will remain  in effect (meaning this test can be used) for the duration of the COVID-19 declaration under Section 564(b)(1) of the Act, 21 U.S.C. section 360bbb-3(b)(1), unless the authorization is terminated or revoked sooner.     Influenza A by PCR NEGATIVE NEGATIVE Final   Influenza B by PCR NEGATIVE NEGATIVE Final    Comment: (NOTE) The Xpert Xpress SARS-CoV-2/FLU/RSV assay is intended as an aid in  the diagnosis of influenza from Nasopharyngeal swab specimens and  should not be used as a sole basis for treatment. Nasal washings  and  aspirates are unacceptable for Xpert Xpress SARS-CoV-2/FLU/RSV  testing.  Fact Sheet for Patients: PinkCheek.be  Fact Sheet for Healthcare Providers: GravelBags.it  This test is not yet approved or cleared by the [place] FDA and  has been authorized for detection and/or diagnosis of SARS-CoV-2 by  FDA under an Emergency Use Authorization (EUA). This EUA will remain  in effect (meaning this test can be used) for the duration of the  Covid-19 declaration under Section 564(b)(1) of the Act, 21  U.S.C. section 360bbb-3(b)(1), unless the authorization is  terminated or revoked.    Respiratory Syncytial Virus by PCR NEGATIVE NEGATIVE Final    Comment: (NOTE) Fact Sheet for Patients: PinkCheek.be  Fact Sheet for Healthcare  Providers: GravelBags.it  This test is not yet approved or cleared by the [place] FDA and  has been authorized for detection and/or diagnosis of SARS-CoV-2 by  FDA under an Emergency Use Authorization (EUA). This EUA will remain  in effect (meaning this test can be used) for the duration of the  COVID-19 declaration under Section 564(b)(1) of the Act, 21 U.S.C.  section 360bbb-3(b)(1), unless the authorization is terminated or  revoked. Performed at [facility], [place] [address] [place], [place] [address]   MRSA PCR Screening     Status: Abnormal   Collection Time: 06/03/20  2:00 PM   Specimen: Nasal Mucosa; Nasopharyngeal  Result Value Ref Range Status   MRSA by PCR POSITIVE (A) NEGATIVE Final    Comment:        The GeneXpert MRSA Assay (FDA approved for NASAL specimens only), is one component of a comprehensive MRSA colonization surveillance program. It is not intended to diagnose MRSA infection nor to guide or monitor treatment for MRSA infections. RESULT CALLED TO, READ BACK BY AND VERIFIED WITH: HEAVNER,L. RN @1848  06/03/20 BILLINGSLEY,L Performed at [facility], [place] [address], [place],  [address]      Radiology Studies: VAS [place] UPPER EXTREMITY VENOUS DUPLEX  Result Date: 06/07/2020 UPPER VENOUS STUDY  Indications: Swelling Comparison Study: 10/04/19 previous Performing Technologist: Abram Sander RVS  Examination Guidelines: A complete evaluation includes B-mode imaging, spectral Doppler, color Doppler, and power Doppler as needed of all accessible portions of each vessel. Bilateral testing is considered an integral part of a complete examination. Limited examinations for reoccurring indications may be performed as noted.  Left Findings: +----------+------------+---------+-----------+----------+-------+ LEFT      CompressiblePhasicitySpontaneousPropertiesSummary  +----------+------------+---------+-----------+----------+-------+ IJV           Full       Yes       Yes                      +----------+------------+---------+-----------+----------+-------+ Subclavian    Full       Yes       Yes                      +----------+------------+---------+-----------+----------+-------+ Axillary      Full       Yes       Yes                      +----------+------------+---------+-----------+----------+-------+ Brachial                 Yes       Yes                      +----------+------------+---------+-----------+----------+-------+ Radial  Full                                          +----------+------------+---------+-----------+----------+-------+ Ulnar         Full                                          +----------+------------+---------+-----------+----------+-------+ Cephalic      Full                                          +----------+------------+---------+-----------+----------+-------+ Basilic                                             fistula +----------+------------+---------+-----------+----------+-------+  Summary:  Left: No evidence of deep vein thrombosis in the upper extremity. No evidence of superficial vein thrombosis in the upper extremity. No evidence of thrombosis in the subclavian.  *See table(s) above for measurements and observations.    Preliminary     Scheduled Meds: . acetaminophen  650 mg Oral Q6H  . amLODipine  5 mg Oral Daily  . Chlorhexidine Gluconate Cloth  6 each Topical Q0600  . collagenase   Topical Daily  . insulin aspart  0-9 Units Subcutaneous TID WC  . insulin glargine  16 Units Subcutaneous Daily  . linezolid  600 mg Oral Q12H  . metoprolol tartrate  25 mg Oral BID  . mupirocin ointment  1 application Nasal BID  . oxyCODONE  10 mg Oral Q12H  . predniSONE  5 mg Oral QAC breakfast  . sodium chloride flush  3 mL Intravenous Q12H  . tacrolimus  3 mg Oral BID  .  torsemide  100 mg Oral Daily   Continuous Infusions: . dextrose    . heparin 1,650 Units/hr (06/07/20 0746)     LOS: 4 days   Marylu Lund, MD [facility] Pager On Amion  If 7PM-7AM, please contact night-coverage 06/07/2020, 5:43 PM

## 2020-06-07 NOTE — Progress Notes (Signed)
[place] for heparin Indication: hx bioprosthetic AVR, stroke, afib  Allergies  Allergen Reactions  . Ibuprofen Nausea And Vomiting  . Sulfamethoxazole-Trimethoprim Itching, Swelling and Rash    Swelling of the face  . Sulfonamide Derivatives Itching, Swelling and Rash    Swelling of the face  . Tape Rash    Paper tape is ok  . Tramadol Nausea And Vomiting  . Doxycycline Nausea Only  . Hydrocil [Psyllium] Nausea And Vomiting  . Bactrim Itching, Swelling and Rash  . Red Dye Itching and Rash    Patient Measurements: Height: 5\' 1"  (154.9 cm) Weight: 80.8 kg (178 lb 2.1 oz) IBW/kg (Calculated) : 47.8 Heparin Dosing Weight: 82 kg  Vital Signs: Temp: 97.9 F (36.6 C) (11/20 0432) Temp Source: Oral (11/20 0432) BP: 117/59 (11/20 0432) Pulse Rate: 83 (11/20 0432)  Labs: Recent Labs    06/05/20 0034 06/05/20 0034 06/05/20 0934 06/05/20 1159 06/06/20 0440 06/07/20 0450  HGB 12.4   < >  --   --  11.6* 11.1*  HCT 42.2  --   --   --  39.2 37.5  PLT 168  --   --   --  186 193  HEPARINUNFRC 0.30   < > 0.40  --  0.37 0.20*  CREATININE 2.65*  --   --   --  2.64* 2.48*  CKTOTAL  --   --   --  98  --   --    < > = values in this interval not displayed.    Estimated Creatinine Clearance: 18.6 mL/min (A) (by C-G formula based on SCr of 2.48 mg/dL (H)).   Medications:  - on warfarin PTA  Assessment: Patient is a 74 y.o F with hx bioprosthetic AVR, stroke, and afib on warfarin PTA presented to the ED on 11/16 with fever. Pharmacy is consulted to transition to heparin drip on admission.  Today, 06/07/2020:  Heparin level now SUBtherapeutic on current IV heparin rate of 1500 units/hr  Stable CBC  SCr still elevated  Per RN,  reported bleeding   Goal of Therapy:  Heparin level 0.3-0.7 units/ml Monitor platelets by anticoagulation protocol: Yes   Plan:   Increase IV heparin from 1500 units/hr to 1650 units/hr  Recheck heparin  level 8 hours after rate increase  Daily heparin level & CBC  Monitor for s/s bleeding   Adrian Saran, PharmD, BCPS 06/07/2020 7:16 AM

## 2020-06-07 NOTE — Progress Notes (Signed)
[place] for heparin Indication: hx bioprosthetic AVR, stroke, afib  Allergies  Allergen Reactions  . Ibuprofen Nausea And Vomiting  . Sulfamethoxazole-Trimethoprim Itching, Swelling and Rash    Swelling of the face  . Sulfonamide Derivatives Itching, Swelling and Rash    Swelling of the face  . Tape Rash    Paper tape is ok  . Tramadol Nausea And Vomiting  . Doxycycline Nausea Only  . Hydrocil [Psyllium] Nausea And Vomiting  . Bactrim Itching, Swelling and Rash  . Red Dye Itching and Rash   Patient Measurements: Height: 5\' 1"  (154.9 cm) Weight: 80.8 kg (178 lb 2.1 oz) IBW/kg (Calculated) : 47.8 Heparin Dosing Weight: 82 kg  Vital Signs: Temp: 98.4 F (36.9 C) (11/20 1425) Temp Source: Oral (11/20 1425) BP: 146/93 (11/20 1425) Pulse Rate: 86 (11/20 1425)  Labs: Recent Labs    06/05/20 0034 06/05/20 0934 06/05/20 1159 06/06/20 0440 06/07/20 0450 06/07/20 1604  HGB 12.4  --   --  11.6* 11.1*  --   HCT 42.2  --   --  39.2 37.5  --   PLT 168  --   --  186 193  --   HEPARINUNFRC 0.30   < >  --  0.37 0.20* 0.43  CREATININE 2.65*  --   --  2.64* 2.48*  --   CKTOTAL  --   --  98  --   --   --    < > = values in this interval not displayed.    Estimated Creatinine Clearance: 18.6 mL/min (A) (by C-G formula based on SCr of 2.48 mg/dL (H)).   Medications:  - on warfarin PTA  Assessment: Patient is a 74 y.o F with hx bioprosthetic AVR, stroke, and afib on warfarin PTA presented to the ED on 11/16 with fever. Pharmacy is consulted to transition to heparin drip on admission.  Today, 06/07/2020:  AM Heparin level was SUBtherapeutic on Heparin rate of 1500 units/hr > rate increase  Stable CBC  SCr still elevated  Per RN,  reported bleeding  1600 Heparin level therapeutic at 0.43 units/ml on 1650 units/hr   Goal of Therapy:  Heparin level 0.3-0.7 units/ml Monitor platelets by anticoagulation protocol: Yes   Plan:    Continue Heparin infusion at 1650 units/hr  Daily heparin level & CBC  Monitor for s/s bleeding  Minda Ditto PharmD 06/07/2020, 5:04 PM

## 2020-06-07 NOTE — Progress Notes (Signed)
Daily Progress Note   Patient Name: [name]       Date: 06/07/2020 DOB: 1946/07/18  Age: 74 y.o. MRN#: [id number] Attending Physician: Donne Hazel, MD Primary Care Physician: [name], MD Admit Date: 06/03/2020  Reason for Consultation/Follow-up: Establishing goals of care and Pain control  Subjective:  I saw and examined [name] today.  Her husband, [name], was at the bedside as well.  On my arrival to the room, [name] was lying in bed holding her husband's hand with slight grimace on her face.  She endorsed having pain and she had just received pain medication.  She did doze off shortly after my arrival and seemed to sleep for the rest of the encounter.  I talked with her husband, [name], about her overall clinical situation and concerns about being able to adequately manage her pain moving forward as this is related to ischemia which is very difficult pain to manage, even with the addition of opioid pain medications.  [name] reports understanding and states she has been dealing with this for a very long time.  He also expressed that he was told that amputation may be recommended in the future and states he is not sure that she would wants this.  We talked about plan for discharging to rehab in order to try and regain more functional status.  [name] reports that he is not opposed to rehab, however, he wants to know exactly what the goal would be in her transitioning there as he understands there are challenges associated with going to rehab as well.  He reports being concerned about left upper extremity swelling.  I examined her and she does have notable swelling of the left forearm compared to the right.  Discussed the continued declines in nutrition, cognition, and functional  status [name] has suffered over the past few weeks/months.  I discussed with [name] working to develop care plan that focuses on interventions that are likely to help her continue to enjoy things that are important to her while also potentially limiting interventions that are not likely to be helpful in supporting her goal of being at home with family.  He expressed understanding and will continue to consider long-term care plan.  He is agreeable to outpatient palliative care follow-up to continue conversation based upon her clinical  course over the next couple of weeks.  Length of Stay: 4  Current Medications: Scheduled Meds:  . acetaminophen  650 mg Oral Q6H  . amLODipine  5 mg Oral Daily  . Chlorhexidine Gluconate Cloth  6 each Topical Q0600  . collagenase   Topical Daily  . insulin aspart  0-9 Units Subcutaneous TID WC  . insulin glargine  16 Units Subcutaneous Daily  . linezolid  600 mg Oral Q12H  . metoprolol tartrate  25 mg Oral BID  . mupirocin ointment  1 application Nasal BID  . oxyCODONE  10 mg Oral Q12H  . predniSONE  5 mg Oral QAC breakfast  . sodium chloride flush  3 mL Intravenous Q12H  . tacrolimus  3 mg Oral BID  . torsemide  100 mg Oral Daily    Continuous Infusions: . heparin      PRN Meds: acetaminophen **OR** acetaminophen, HYDROmorphone (DILAUDID) injection, oxyCODONE, polyethylene glycol  Physical Exam    General:  Acknowledges me in room but sleeps throughout large portion of encounter.  She did just receive pain medication prior to my arrival..  HEENT: No bruits, no goiter, no JVD Heart: Regular rate and rhythm. No murmur appreciated. Lungs: Good air movement, clear Abdomen: Soft, nontender, nondistended, positive bowel sounds.  Skin: Warm and dry Neuro: Does not really follow commands reliably      Ext: Left arm notably swollen and warm and hand versus right arm  Vital Signs: BP (!) 117/59 (BP Location: Right Arm)   Pulse 83   Temp 97.9 F (36.6  C) (Oral)   Resp 18   Ht 5\' 1"  (1.549 m)   Wt 80.8 kg   SpO2 (!) 86%   BMI 33.66 kg/m  SpO2: SpO2: (!) 86 % O2 Device: O2 Device: Nasal Cannula O2 Flow Rate: O2 Flow Rate (L/min): 2 L/min  Intake/output summary:   Intake/Output Summary (Last 24 hours) at 06/07/2020 6222 Last data filed at 06/07/2020 0600 Gross per 24 hour  Intake 396.32 ml  Output 1002 ml  Net -605.68 ml   LBM: Last BM Date:  (PTA) Baseline Weight: Weight: 82 kg Most recent weight: Weight: 80.8 kg       Palliative Assessment/Data:    Flowsheet Rows     Most Recent Value  Intake Tab  Referral Department Hospitalist  Unit at Time of Referral Med/Surg Unit  Clinical Assessment  Psychosocial & Spiritual Assessment  Palliative Care Outcomes      Patient Active Problem List   Diagnosis Date Noted  . Pulmonary hypertension, unspecified ([place])   . Atrial fibrillation with rapid ventricular response ([place]) 06/03/2020  . Sepsis ([place]) 06/03/2020  . ESRD (end stage renal disease) ([place])   . Venous stasis ulcers of both lower extremities ([place]) 01/31/2020  . Venous stasis dermatitis of both lower extremities 01/31/2020  . Claudication of both lower extremities ([place]) 01/31/2020  . Chronic venous insufficiency 12/31/2019  . Heart failure ([place]) 10/02/2019  . Dyspnea 08/20/2019  . COPD (chronic obstructive pulmonary disease) ([place]) 08/20/2019  . Hematoma of arm, left, subsequent encounter 07/23/2019  . Obesity (BMI 30-39.9) 12/20/2018  . Acute respiratory failure with hypoxia ([place]) 12/19/2018  . Gait disorder 11/22/2018  . History of seizure disorder 09/12/2018  . History of stroke 09/12/2018  . Edema 08/28/2018  . Hypertension secondary to other renal disorders 03/22/2018  . Proteinuria 03/22/2018  . Malignant neoplasm of lung ([place]) 03/22/2018  . Breast pain in female 02/04/2018  . Lung cancer ([place]) 11/30/2017  .  Adenocarcinoma of lung, stage 1, right ([place]) 11/25/2017  . Chronic respiratory failure with  hypoxia ([place]) 12/27/2016  . Acute on chronic diastolic CHF (congestive heart failure) ([place]) 12/27/2016  . Left upper extremity swelling 12/27/2016  . Pain of left breast 12/27/2016  . Arm muscle atrophy 11/16/2016  . Insomnia 02/25/2016  . Renal cyst 11/21/2015  . Renal mass, right 11/10/2015  . Shoulder pain, right 08/13/2015  . Weight gain 08/12/2014  . Multinodular goiter 05/01/2014  . Encounter for therapeutic drug monitoring 08/14/2013  . S/P aortic valve replacement with bioprosthetic valve and maze procedure 04/12/2013  . Nodule of right lung 04/07/2013  . Nodule of left lung 04/07/2013  . Pain in limb 05/22/2012  . Dermatitis 01/26/2012  . Long term (current) use of anticoagulants 08/17/2011  . Anemia in chronic renal disease 05/13/2011  . Hypokalemia 04/26/2011  . Immunosuppression ([place]) 04/26/2011  . Hypertension associated with diabetes ([place]) 04/26/2011  . Renal transplant recipient 03/16/2011  . Low back pain 02/23/2011  . Hypersalivation 11/24/2010  . AORTIC STENOSIS 08/20/2010  . DISTURBANCE OF SALIVARY SECRETION 07/28/2010  . NAUSEA 04/14/2010  . SMOKER 10/07/2009  . ALOPECIA 10/07/2009  . CAROTID STENOSIS 03/04/2009  . AF (paroxysmal atrial fibrillation) ([place]) 12/31/2008  . NEOPLASM, MALIGNANT, KIDNEY 07/02/2008  . APHASIA DUE TO CEREBROVASCULAR DISEASE 07/02/2008  . Chronic fatigue 05/21/2008  . Hyperlipidemia LDL goal <70 09/27/2007  . Constipation 09/27/2007  . SLEEP APNEA 09/27/2007  . CHOLELITHIASIS 06/09/2007  . HELICOBACTER PYLORI INFECTION, HX OF 06/08/2007  . Type 2 diabetes mellitus with chronic kidney disease, with long-term current use of insulin ([place]) 05/23/2007  . Gout 05/23/2007  . 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 MYOCARDIAL INFARCTION, HX OF 05/23/2007  . Coronary atherosclerosis 05/23/2007  . GERD 05/23/2007  . COLONIC POLYPS, HX OF 05/23/2007  . DIVERTICULOSIS, COLON 07/01/2005    Palliative Care Assessment & Plan     Patient Profile: 74 y.o. female  with past medical history of CAD status post stent, severe aortic stenosis status post AVR, hypertension, hyperlipidemia, OSA, COPD with intermittent O2 requirement, HFpEF, atrial fibrillation status post maze on Coumadin, type 2 diabetes, end-stage renal disease status post transplant, CKD 4, adenocarcinoma of the lung, embolic CVA with residual aphasia, bilateral lower extremity nonhealing wounds and recent abdominal aortogram with out actionable findings (would likely need eventual AKA) admitted on 06/03/2020 with sepsis, concern for endocarditis, A. fib and worsening renal failure.  Palliative consulted for goals of care.  Assessment: Patient Active Problem List   Diagnosis Date Noted  . Pulmonary hypertension, unspecified ([place])   . Atrial fibrillation with rapid ventricular response ([place]) 06/03/2020  . Sepsis ([place]) 06/03/2020  . ESRD (end stage renal disease) ([place])   . Venous stasis ulcers of both lower extremities ([place]) 01/31/2020  . Venous stasis dermatitis of both lower extremities 01/31/2020  . Claudication of both lower extremities ([place]) 01/31/2020  . Chronic venous insufficiency 12/31/2019  . Heart failure ([place]) 10/02/2019  . Dyspnea 08/20/2019  . COPD (chronic obstructive pulmonary disease) ([place]) 08/20/2019  . Hematoma of arm, left, subsequent encounter 07/23/2019  . Obesity (BMI 30-39.9) 12/20/2018  . Acute respiratory failure with hypoxia ([place]) 12/19/2018  . Gait disorder 11/22/2018  . History of seizure disorder 09/12/2018  . History of stroke 09/12/2018  . Edema 08/28/2018  . Hypertension secondary to other renal disorders 03/22/2018  . Proteinuria 03/22/2018  . Malignant neoplasm of lung ([place]) 03/22/2018  . Breast pain in female 02/04/2018  . Lung  cancer ([place]) 11/30/2017  . Adenocarcinoma of lung, stage 1, right ([place]) 11/25/2017  . Chronic respiratory failure with hypoxia ([place]) 12/27/2016  . Acute on chronic diastolic CHF  (congestive heart failure) ([place]) 12/27/2016  . Left upper extremity swelling 12/27/2016  . Pain of left breast 12/27/2016  . Arm muscle atrophy 11/16/2016  . Insomnia 02/25/2016  . Renal cyst 11/21/2015  . Renal mass, right 11/10/2015  . Shoulder pain, right 08/13/2015  . Weight gain 08/12/2014  . Multinodular goiter 05/01/2014  . Encounter for therapeutic drug monitoring 08/14/2013  . S/P aortic valve replacement with bioprosthetic valve and maze procedure 04/12/2013  . Nodule of right lung 04/07/2013  . Nodule of left lung 04/07/2013  . Pain in limb 05/22/2012  . Dermatitis 01/26/2012  . Long term (current) use of anticoagulants 08/17/2011  . Anemia in chronic renal disease 05/13/2011  . Hypokalemia 04/26/2011  . Immunosuppression ([place]) 04/26/2011  . Hypertension associated with diabetes ([place]) 04/26/2011  . Renal transplant recipient 03/16/2011  . Low back pain 02/23/2011  . Hypersalivation 11/24/2010  . AORTIC STENOSIS 08/20/2010  . DISTURBANCE OF SALIVARY SECRETION 07/28/2010  . NAUSEA 04/14/2010  . SMOKER 10/07/2009  . ALOPECIA 10/07/2009  . CAROTID STENOSIS 03/04/2009  . AF (paroxysmal atrial fibrillation) ([place]) 12/31/2008  . NEOPLASM, MALIGNANT, KIDNEY 07/02/2008  . APHASIA DUE TO CEREBROVASCULAR DISEASE 07/02/2008  . Chronic fatigue 05/21/2008  . Hyperlipidemia LDL goal <70 09/27/2007  . Constipation 09/27/2007  . SLEEP APNEA 09/27/2007  . CHOLELITHIASIS 06/09/2007  . HELICOBACTER PYLORI INFECTION, HX OF 06/08/2007  . Type 2 diabetes mellitus with chronic kidney disease, with long-term current use of insulin ([place]) 05/23/2007  . Gout 05/23/2007  . 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 MYOCARDIAL INFARCTION, HX OF 05/23/2007  . Coronary atherosclerosis 05/23/2007  . GERD 05/23/2007  . COLONIC POLYPS, HX OF 05/23/2007  . DIVERTICULOSIS, COLON 07/01/2005    Recommendations/Plan: - Pain: Related to ischemic disease which is very difficult to treat.   I reviewed the [facility] and she has had the equivalent of 80 mg of oral morphine over the last 24 hours.  I agree with plan for addition of OxyContin 10 mg twice daily.  I also agree that she would likely benefit from addition of oral rescue medication to ensure that this is something that will control her pain at time of discharge.  While I believe Percocet to be a good choice, she may benefit further by splitting out the opioid from acetaminophen component so we can schedule the acetaminophen to keep more of her baseline medication in her system.  I scheduled on Tylenol 654 times daily.  I also made the addition of oxycodone 5 mg every 4 hours as needed for breakthrough pain.  I talked with her husband about pain management and concerned that her pain may continue to be uncontrolled.  Discussed prior vascular surgery recommendation for consideration of amputation if pain cannot be managed otherwise. -Left upper extremity swelling: Discussed with Dr. Wyline Copas.  Likely plan for ultrasound of arm. - GOC: Her husband reports that he agrees with placement for rehab if there is specific goal in mind to help benefit her long-term.  He does worry about her going to rehab as she has had bad experiences in the past.  We discussed long-term goals as well and I recommended continuing to consider what is the help with any potential medical interventions.  He is going to continue to consider this and I will continue to progress conversation while  she is here inpatient.  She will likely benefit from outpatient palliative care services on discharge.   Goals of Care and Additional Recommendations:  Limitations on Scope of Treatment: Full Scope Treatment  Code Status:    Code Status Orders  (From admission, onward)         Start     Ordered   06/03/20 0919  Full code  Continuous        06/03/20 0918        Code Status History    Date Active Date Inactive Code Status Order ID Comments User Context   05/30/2020 1320  05/30/2020 2355 Full Code [id number]  Elam Dutch, MD Inpatient   10/01/2019 2327 10/11/2019 1851 Full Code [id number]  Etta Quill, DO ED   12/19/2018 2042 12/26/2018 1751 Full Code [id number]  [place] Eric J, DO Inpatient   02/04/2018 0244 02/08/2018 1925 Full Code [id number]  Vianne Bulls, MD ED   12/27/2016 2146 01/01/2017 1532 Full Code [id number]  Ivor Costa, MD ED   04/12/2013 1330 04/23/2013 1855 Full Code [id number]  Rexene Alberts, MD Inpatient   Advance Care Planning Activity    Advance Directive Documentation     Most Recent Value  Type of Advance Directive Healthcare Power of Attorney  Pre-existing out of facility DNR order (yellow form or pink MOST form) --  "MOST" Form in Place? --       Prognosis:   Unable to determine  Discharge Planning:  [place] for rehab with Palliative care service follow-up  Care plan was discussed with husband  Thank you for allowing the Palliative Medicine Team to assist in the care of this patient.   Time In: 1220 Time Out: 1300 Total Time 40 Prolonged Time Billed No      Greater than 50%  of this time was spent counseling and coordinating care related to the above assessment and plan.  Micheline Rough, MD  Please contact Palliative Medicine Team phone at [phone] for questions and concerns.

## 2020-06-07 NOTE — Progress Notes (Signed)
Patient CBG was 64 patient refusing to take anything orally. Dr. Wyline Copas notified and new orders received.

## 2020-06-07 NOTE — Progress Notes (Signed)
Upper extremity venous has been completed.   Preliminary results in CV Proc.   Abram Sander 06/07/2020 12:58 PM

## 2020-06-08 DIAGNOSIS — I340 Nonrheumatic mitral (valve) insufficiency: Secondary | ICD-10-CM

## 2020-06-08 DIAGNOSIS — Z953 Presence of xenogenic heart valve: Secondary | ICD-10-CM | POA: Diagnosis not present

## 2020-06-08 DIAGNOSIS — N184 Chronic kidney disease, stage 4 (severe): Secondary | ICD-10-CM

## 2020-06-08 DIAGNOSIS — I4891 Unspecified atrial fibrillation: Secondary | ICD-10-CM | POA: Diagnosis not present

## 2020-06-08 DIAGNOSIS — I5033 Acute on chronic diastolic (congestive) heart failure: Secondary | ICD-10-CM | POA: Diagnosis not present

## 2020-06-08 DIAGNOSIS — I2510 Atherosclerotic heart disease of native coronary artery without angina pectoris: Secondary | ICD-10-CM

## 2020-06-08 DIAGNOSIS — I739 Peripheral vascular disease, unspecified: Secondary | ICD-10-CM

## 2020-06-08 DIAGNOSIS — N186 End stage renal disease: Secondary | ICD-10-CM | POA: Diagnosis not present

## 2020-06-08 DIAGNOSIS — I071 Rheumatic tricuspid insufficiency: Secondary | ICD-10-CM

## 2020-06-08 DIAGNOSIS — A419 Sepsis, unspecified organism: Secondary | ICD-10-CM | POA: Diagnosis not present

## 2020-06-08 DIAGNOSIS — L03115 Cellulitis of right lower limb: Secondary | ICD-10-CM | POA: Diagnosis not present

## 2020-06-08 LAB — COMPREHENSIVE METABOLIC PANEL
ALT: 18 U/L (ref 0–44)
AST: 54 U/L — ABNORMAL HIGH (ref 15–41)
Albumin: 2.4 g/dL — ABNORMAL LOW (ref 3.5–5.0)
Alkaline Phosphatase: 672 U/L — ABNORMAL HIGH (ref 38–126)
Anion gap: 15 (ref 5–15)
BUN: 39 mg/dL — ABNORMAL HIGH (ref 8–23)
CO2: 29 mmol/L (ref 22–32)
Calcium: 9.5 mg/dL (ref 8.9–10.3)
Chloride: 97 mmol/L — ABNORMAL LOW (ref 98–111)
Creatinine, Ser: 2.48 mg/dL — ABNORMAL HIGH (ref 0.44–1.00)
GFR, Estimated: 20 mL/min — ABNORMAL LOW (ref >60)
Glucose, Bld: 108 mg/dL — ABNORMAL HIGH (ref 70–99)
Potassium: 3.5 mmol/L (ref 3.5–5.1)
Sodium: 141 mmol/L (ref 135–145)
Total Bilirubin: 1 mg/dL (ref 0.3–1.2)
Total Protein: 6.4 g/dL — ABNORMAL LOW (ref 6.5–8.1)

## 2020-06-08 LAB — GLUCOSE, CAPILLARY
Glucose-Capillary: 116 mg/dL — ABNORMAL HIGH (ref 70–99)
Glucose-Capillary: 160 mg/dL — ABNORMAL HIGH (ref 70–99)
Glucose-Capillary: 200 mg/dL — ABNORMAL HIGH (ref 70–99)
Glucose-Capillary: 188 mg/dL — ABNORMAL HIGH (ref 70–99)

## 2020-06-08 LAB — CULTURE, BLOOD (ROUTINE X 2)
Culture: NO GROWTH
Culture: NO GROWTH
Special Requests: ADEQUATE

## 2020-06-08 LAB — MAGNESIUM: Magnesium: 2.1 mg/dL (ref 1.7–2.4)

## 2020-06-08 LAB — HEPARIN LEVEL (UNFRACTIONATED): Heparin Unfractionated: 0.37 IU/mL (ref 0.30–0.70)

## 2020-06-08 MED ORDER — ENSURE ENLIVE PO LIQD
237.0000 mL | Freq: Three times a day (TID) | ORAL | Status: DC
  Administered 2020-06-08 – 2020-06-16 (×19): 237 mL via ORAL

## 2020-06-08 NOTE — Plan of Care (Signed)

## 2020-06-08 NOTE — Progress Notes (Signed)
PROGRESS NOTE  [name] WVP:[id number] DOB: [birth year]   PCP: [name], MD  Patient is from: Home  DOA: 06/03/2020 LOS: 5  Chief complaints: Fever  Brief Narrative / Interim history: 74 year old female with history of CAD s/p stents, severe AS s/p AVR, OSA/COPD/chronic RF with as needed O2, diastolic CHF, A. Fib s/p maze on warfarin, DM-2, ESRD s/p transplant now with CKD-4, adenocarcinoma of lung, embolic CVA with residual aphasia and BLE nonhealing wounds s/p abdominal aortogram on 11/12 found to have fever, tachypnea, tachycardia with difficulty clearing secretions after she called EMS for epistaxis.  Patient was admitted for sepsis due to LLE purulent cellulitis and Proteus UTI, and A. fib with RVR, acute on chronic diastolic CHF and acute on chronic hypoxemic RF.  Empirically treated with vancomycin and meropenem which were narrowed to Zyvox.  Also treated with IV Lasix for CHF exacerbation.  Cardiology and palliative medicine following.   Subjective: Seen and examined earlier this morning.  Patient is not interactive but only moans due to pain with minimal movement of her legs.  Awake and follows some commands.  Objective: Vitals:   06/07/20 2016 06/08/20 0410 06/08/20 0532 06/08/20 1414  BP: (!) 141/83 (!) 147/84  (!) 154/69  Pulse: 79 61  67  Resp: 18 (!) 22  16  Temp: 98.1 F (36.7 C) 97.9 F (36.6 C)  (!) 97.5 F (36.4 C)  TempSrc: Oral Axillary    SpO2: 98% 100%  (!) 88%  Weight:   80.8 kg   Height:        Intake/Output Summary (Last 24 hours) at 06/08/2020 1527 Last data filed at 06/08/2020 1100 Gross per 24 hour  Intake 1660 ml  Output --  Net 1660 ml   Filed Weights   06/05/20 0527 06/07/20 0500 06/08/20 0532  Weight: 85.1 kg 80.8 kg 80.8 kg    Examination:  GENERAL: Chronically ill-appearing.  Nontoxic. HEENT: MMM.  Vision and hearing grossly intact.  NECK: Supple.  No apparent JVD.  RESP: On RA.  No IWOB.  Fair aeration  bilaterally. CVS:  RRR. Heart sounds normal.  ABD/GI/GU: BS+. Abd soft, NTND.  MSK/EXT: Barely moves BLE.  Edema, erythema, bullae and ulceration as below SKIN: Large bulla over dorsal aspect of left foot.  Two large ulcerations over LLE.  Small clean looking ulcers on RLE.  Edema with increased warmth to touch in BLE.  See pictures below for more. NEURO: Awake.  Follows some commands.  Further neuro exam limited due to patient's inability to cooperate. PSYCH: Calm.  No agitation.        Procedures:  None  Microbiology summarized: NIOEV-03, influenza and RSV PCR nonreactive. Urine culture with 10,000 colonies of Proteus mirabilis resistant to Macrobid and imipenem Blood cultures NGTD.  Assessment & Plan: Sepsis due to LLE purulent cellulitis/infected nonhealing wounds due to PAD and possible Proteus UTI: POA.  Blood cultures NGTD.  Urine culture with 10,000 colonies of Proteus mirabilis.  TTE did not reveal large vegetation.  Patient had an abdominal aortogram on 11/12 that showed heavily calcified bilateral proximal arteries with flush occlusion of bilateral superficial femoral artery.  CT LLE revealed open ulceration with cellulitis and mild fasciitis -Per vascular surgery, amputation would be the next consideration if pain is unbearable -Appreciate help on pain control and goal of care by palliative medicine -Vancomycin and meropenem>> Zyvox -Appreciate help by wound care. -Discontinue amlodipine-could contribute to her edema and delayed wound healing  Acute on chronic hypoxemic respiratory failure:  Likely due to CHF exacerbation. Acute on chronic diastolic CHF: TTE with EF of 55 to 60%, RWMA, RVSP of 73.4, severe LAE, moderate RAE and moderate to severe MVR, severe mitral annular calcification and severe TVR.  On p.o. torsemide.  I&O incomplete.  Creatinine stable. -Cardiology following-continue Demadex 100 mg daily per cardiology -Monitor fluid status and renal  functions  Moderate to severe mitral valve regurgitation Severe tricuspid valve regurgitation -Cardiology on board. -Continue diuretics as above.  Not a candidate for surgical intervention.  History of severe AS s/p AVR with prosthetic valve: Seems stable on recent TTE. -Appreciate help by cardiology  A. fib with RVR: Rate controlled.  On metoprolol and warfarin at home. -Continue metoprolol and heparin drip  History of CAD s/p stents -Continue metoprolol, statin  History of embolic CVA with residual expressive aphasia: CT head ->prior infarct at the junction of the left temporal parietal and occipital lobes, no acute infarct.  -Appreciate help by SLP  Abdominal pain: Unclear etiology.  CT abdomen and pelvis without contrast revealed new body wall edema.  Her recent arthrogram but doesn't look like ischemic bowel.  History of ESRD status post renal transplant now with CKD-4: On low-dose prednisone and tacrolimus -Continue tacrolimus and prednisone per nephrology recommendation -Liberated diet.  Uncontrolled DM-2 with hypo and hyperglycemia: A1c 9.2%.  Unreliable p.o. intake. Recent Labs  Lab 06/07/20 1700 06/07/20 1907 06/07/20 2019 06/08/20 0717 06/08/20 1205  GLUCAP 64* 97 93 116* 160*  -Continue current regimen -Continue D10  Essential hypertension: Normotensive. -Continue p.o. metoprolol  Hyperlipidemia -Continue statin  Left arm swelling: LUE Doppler negative for DVT.  Goal of care: Patient with multiple comorbidities as above.  Very grim prognosis.  She is still full code which I believe would pose more harm than benefit.  Palliative medicine following.  Plan for family meeting tomorrow -Appreciate help by palliative medicine  Inadequate oral intake Body mass index is 33.66 kg/m. Nutrition Problem: Inadequate oral intake Etiology: decreased appetite Signs/Symptoms: per patient/family report Interventions: Ensure Enlive (each supplement provides 350kcal  and 20 grams of protein), Magic cup, MVI, Liberalize Diet   DVT prophylaxis:  On heparin drip for A. fib  Code Status: Full code Family Communication: Patient and/or RN. Available if any question.  Status is: Inpatient  Remains inpatient appropriate because:Ongoing active pain requiring inpatient pain management, Altered mental status, Unsafe d/c plan, IV treatments appropriate due to intensity of illness or inability to take PO and Inpatient level of care appropriate due to severity of illness   Dispo: The patient is from: Home              Anticipated d/c is to: SNF              Anticipated d/c date is: > 3 days              Patient currently is not medically stable to d/c.       Consultants:  Cardiology Vascular surgery Nephrology over the phone Palliative medicine   Sch Meds:  Scheduled Meds: . acetaminophen  650 mg Oral Q6H  . amLODipine  5 mg Oral Daily  . Chlorhexidine Gluconate Cloth  6 each Topical Q0600  . collagenase   Topical Daily  . feeding supplement  237 mL Oral TID BM  . fentaNYL  1 patch Transdermal Q72H  . insulin aspart  0-9 Units Subcutaneous TID WC  . linezolid  600 mg Oral Q12H  . metoprolol tartrate  25 mg Oral BID  .  mupirocin ointment  1 application Nasal BID  . predniSONE  5 mg Oral QAC breakfast  . sodium chloride flush  3 mL Intravenous Q12H  . tacrolimus  3 mg Oral BID  . torsemide  100 mg Oral Daily   Continuous Infusions: . dextrose 50 mL/hr at 06/07/20 1846  . heparin 1,650 Units/hr (06/08/20 0840)   PRN Meds:.HYDROmorphone (DILAUDID) injection, oxyCODONE, polyethylene glycol  Antimicrobials: Anti-infectives (From admission, onward)   Start     Dose/Rate Route Frequency Ordered Stop   06/06/20 2200  linezolid (ZYVOX) tablet 600 mg        600 mg Oral Every 12 hours 06/06/20 1136     06/05/20 2200  linezolid (ZYVOX) IVPB 600 mg  Status:  Discontinued        600 mg 300 mL/hr over 60 Minutes Intravenous Every 12 hours 06/05/20  1814 06/06/20 1136   06/05/20 1400  ceFAZolin (ANCEF) IVPB 1 g/50 mL premix  Status:  Discontinued        1 g 100 mL/hr over 30 Minutes Intravenous Every 8 hours 06/05/20 0931 06/05/20 1006   06/05/20 1100  ceFAZolin (ANCEF) IVPB 1 g/50 mL premix  Status:  Discontinued        1 g 100 mL/hr over 30 Minutes Intravenous Every 12 hours 06/05/20 1006 06/05/20 1811   06/04/20 2200  cefTRIAXone (ROCEPHIN) 2 g in sodium chloride 0.9 % 100 mL IVPB  Status:  Discontinued        2 g 200 mL/hr over 30 Minutes Intravenous Every 24 hours 06/04/20 1315 06/05/20 0931   06/04/20 1415  linezolid (ZYVOX) IVPB 600 mg  Status:  Discontinued        600 mg 300 mL/hr over 60 Minutes Intravenous Every 12 hours 06/04/20 1315 06/05/20 0931   06/04/20 1415  metroNIDAZOLE (FLAGYL) tablet 500 mg  Status:  Discontinued        500 mg Oral Every 8 hours 06/04/20 1315 06/05/20 0931   06/03/20 1800  meropenem (MERREM) 500 mg in sodium chloride 0.9 % 100 mL IVPB  Status:  Discontinued        500 mg 200 mL/hr over 30 Minutes Intravenous Every 12 hours 06/03/20 0907 06/04/20 1315   06/03/20 0909  vancomycin variable dose per unstable renal function (pharmacist dosing)  Status:  Discontinued         Does not apply See admin instructions 06/03/20 0909 06/04/20 1315   06/03/20 0430  ceFEPIme (MAXIPIME) 2 g in sodium chloride 0.9 % 100 mL IVPB        2 g 200 mL/hr over 30 Minutes Intravenous  Once 06/03/20 0423 06/03/20 0617   06/03/20 0430  piperacillin-tazobactam (ZOSYN) IVPB 3.375 g        3.375 g 12.5 mL/hr over 240 Minutes Intravenous  Once 06/03/20 0423 06/03/20 0909   06/03/20 0430  vancomycin (VANCOCIN) IVPB 1000 mg/200 mL premix        1,000 mg 200 mL/hr over 60 Minutes Intravenous  Once 06/03/20 0423 06/03/20 0617       I have personally reviewed the following labs and images: CBC: Recent Labs  Lab 06/03/20 0407 06/04/20 0423 06/05/20 0034 06/06/20 0440 06/07/20 0450  WBC 14.2* 16.0* 14.5* 13.4* 13.3*   NEUTROABS 12.3*  --   --   --   --   HGB 12.5 12.0 12.4 11.6* 11.1*  HCT 41.7 39.5 42.2 39.2 37.5  MCV 80.0 79.6* 81.3 80.8 79.6*  PLT 199 168 168 186 193  BMP &GFR Recent Labs  Lab 06/04/20 0423 06/05/20 0034 06/06/20 0440 06/07/20 0450 06/08/20 0350  NA 138 136 139 141 141  K 3.8 5.9* 3.2* 2.9* 3.5  CL 101 95* 95* 96* 97*  CO2 28 28 32 33* 29  GLUCOSE 113* 123* 128* 81 108*  BUN 52* 46* 44* 41* 39*  CREATININE 2.58* 2.65* 2.64* 2.48* 2.48*  CALCIUM 9.7 9.1 9.4 9.4 9.5  MG  --   --   --   --  2.1   Estimated Creatinine Clearance: 18.6 mL/min (A) (by C-G formula based on SCr of 2.48 mg/dL (H)). Liver & Pancreas: Recent Labs  Lab 06/03/20 0407 06/08/20 0350  AST 23 54*  ALT 15 18  ALKPHOS 136* 672*  BILITOT 1.1 1.0  PROT 7.2 6.4*  ALBUMIN 2.8* 2.4*   No results for input(s): LIPASE, AMYLASE in the last 168 hours. No results for input(s): AMMONIA in the last 168 hours. Diabetic: No results for input(s): HGBA1C in the last 72 hours. Recent Labs  Lab 06/07/20 1700 06/07/20 1907 06/07/20 2019 06/08/20 0717 06/08/20 1205  GLUCAP 64* 97 93 116* 160*   Cardiac Enzymes: Recent Labs  Lab 06/05/20 1159  CKTOTAL 98   No results for input(s): PROBNP in the last 8760 hours. Coagulation Profile: Recent Labs  Lab 06/03/20 0407 06/04/20 0423  INR 1.3* 1.4*   Thyroid Function Tests: No results for input(s): TSH, T4TOTAL, FREET4, T3FREE, THYROIDAB in the last 72 hours. Lipid Profile: Recent Labs    06/06/20 0440  CHOL 90  HDL 50  LDLCALC 32  TRIG 39  CHOLHDL 1.8   Anemia Panel: No results for input(s): VITAMINB12, FOLATE, FERRITIN, TIBC, IRON, RETICCTPCT in the last 72 hours. Urine analysis:    Component Value Date/Time   COLORURINE YELLOW 06/03/2020 0429   APPEARANCEUR CLEAR 06/03/2020 0429   LABSPEC 1.012 06/03/2020 0429   PHURINE 7.0 06/03/2020 0429   GLUCOSEU 150 (A) 06/03/2020 0429   GLUCOSEU 100 (A) 07/02/2016 1440   HGBUR NEGATIVE  06/03/2020 0429   BILIRUBINUR NEGATIVE 06/03/2020 0429   KETONESUR NEGATIVE 06/03/2020 0429   PROTEINUR >=300 (A) 06/03/2020 0429   UROBILINOGEN 0.2 07/02/2016 1440   NITRITE NEGATIVE 06/03/2020 0429   LEUKOCYTESUR SMALL (A) 06/03/2020 0429   Sepsis Labs: Invalid input(s): PROCALCITONIN, [place]  Microbiology: Recent Results (from the past 240 hour(s))  Culture, blood (Routine x 2)     Status: None   Collection Time: 06/03/20  4:07 AM   Specimen: BLOOD  Result Value Ref Range Status   Specimen Description   Final    BLOOD RIGHT ANTECUBITAL Performed at [facility] [address], [place], [place] [address]    Special Requests   Final    BOTTLES DRAWN AEROBIC AND ANAEROBIC Blood Culture adequate volume Performed at [facility] [address], [place], [place] [address]    Culture   Final    NO GROWTH 5 DAYS Performed at [facility], [place] [address] [place], [place] [address]    Report Status 06/08/2020 FINAL  Final  Culture, blood (Routine x 2)     Status: None   Collection Time: 06/03/20  4:07 AM   Specimen: BLOOD RIGHT HAND  Result Value Ref Range Status   Specimen Description   Final    BLOOD RIGHT HAND Performed at [facility] [address], [place], [place] [address]    Special Requests   Final    BOTTLES DRAWN  AEROBIC AND ANAEROBIC Blood Culture results may not be optimal due to an inadequate volume of blood received in culture bottles Performed at [facility], [place] [address], [place], [place] [address]    Culture   Final    NO GROWTH 5 DAYS Performed at [facility], [place] [address], [place], [place] [address]    Report Status 06/08/2020 FINAL  Final  Urine culture     Status: Abnormal   Collection Time: 06/03/20  4:29 AM   Specimen: In/Out Cath Urine  Result Value Ref Range Status   Specimen Description   Final    IN/OUT CATH URINE Performed at [facility] [address], [place], [place] [address]    Special Requests   Final    NONE Performed at [facility], [place] [address] [place], [place] [address]    Culture 10,000 COLONIES/mL PROTEUS MIRABILIS (A)  Final   Report Status 06/05/2020 FINAL  Final   Organism ID, Bacteria PROTEUS MIRABILIS (A)  Final      Susceptibility   Proteus mirabilis - MIC*    AMPICILLIN <=2 SENSITIVE Sensitive     CEFAZOLIN <=4 SENSITIVE Sensitive     CEFEPIME <=0.12 SENSITIVE Sensitive     CEFTRIAXONE <=0.25 SENSITIVE Sensitive     CIPROFLOXACIN <=0.25 SENSITIVE Sensitive     GENTAMICIN <=1 SENSITIVE Sensitive     IMIPENEM 8 INTERMEDIATE Intermediate     NITROFURANTOIN 128 RESISTANT Resistant     TRIMETH/SULFA <=20 SENSITIVE Sensitive     AMPICILLIN/SULBACTAM <=2 SENSITIVE Sensitive     PIP/TAZO <=4 SENSITIVE Sensitive     * 10,000 COLONIES/mL PROTEUS MIRABILIS  Resp Panel by RT PCR (RSV, Flu A&B, Covid) - Nasopharyngeal Swab     Status: None   Collection Time: 06/03/20  8:14 AM   Specimen: Nasopharyngeal Swab  Result Value Ref Range Status   SARS Coronavirus 2 by RT PCR NEGATIVE NEGATIVE Final    Comment: (NOTE) SARS-CoV-2 target nucleic acids are NOT DETECTED.  The SARS-CoV-2 RNA is generally detectable in upper respiratoy specimens during the acute phase of infection. The lowest concentration of SARS-CoV-2 viral copies this assay can detect is 131 copies/mL. A negative result does not preclude SARS-Cov-2 infection and should not be used as the sole basis for treatment or other patient management decisions. A negative result may occur with  improper specimen collection/handling, submission of specimen other than nasopharyngeal swab, presence of viral mutation(s) within the areas targeted by this assay, and inadequate number of viral copies (<131 copies/mL). A negative result must be combined with clinical observations, patient history, and epidemiological  information. The expected result is Negative.  Fact Sheet for Patients:  PinkCheek.be  Fact Sheet for Healthcare Providers:  GravelBags.it  This test is no t yet approved or cleared by the [place] FDA and  has been authorized for detection and/or diagnosis of SARS-CoV-2 by FDA under an Emergency Use Authorization (EUA). This EUA will remain  in effect (meaning this test can be used) for the duration of the COVID-19 declaration under Section 564(b)(1) of the Act, 21 U.S.C. section 360bbb-3(b)(1), unless the authorization is terminated or revoked sooner.     Influenza A by PCR NEGATIVE NEGATIVE Final   Influenza B by PCR NEGATIVE NEGATIVE Final    Comment: (NOTE) The Xpert Xpress SARS-CoV-2/FLU/RSV assay is intended as an aid in  the diagnosis of influenza from Nasopharyngeal swab specimens and  should not be used as a  sole basis for treatment. Nasal washings and  aspirates are unacceptable for Xpert Xpress SARS-CoV-2/FLU/RSV  testing.  Fact Sheet for Patients: PinkCheek.be  Fact Sheet for Healthcare Providers: GravelBags.it  This test is not yet approved or cleared by the [place] FDA and  has been authorized for detection and/or diagnosis of SARS-CoV-2 by  FDA under an Emergency Use Authorization (EUA). This EUA will remain  in effect (meaning this test can be used) for the duration of the  Covid-19 declaration under Section 564(b)(1) of the Act, 21  U.S.C. section 360bbb-3(b)(1), unless the authorization is  terminated or revoked.    Respiratory Syncytial Virus by PCR NEGATIVE NEGATIVE Final    Comment: (NOTE) Fact Sheet for Patients: PinkCheek.be  Fact Sheet for Healthcare Providers: GravelBags.it  This test is not yet approved or cleared by the [place] FDA and  has been  authorized for detection and/or diagnosis of SARS-CoV-2 by  FDA under an Emergency Use Authorization (EUA). This EUA will remain  in effect (meaning this test can be used) for the duration of the  COVID-19 declaration under Section 564(b)(1) of the Act, 21 U.S.C.  section 360bbb-3(b)(1), unless the authorization is terminated or  revoked. Performed at [facility], [place] [address], [place], [place] [address]   MRSA PCR Screening     Status: Abnormal   Collection Time: 06/03/20  2:00 PM   Specimen: Nasal Mucosa; Nasopharyngeal  Result Value Ref Range Status   MRSA by PCR POSITIVE (A) NEGATIVE Final    Comment:        The GeneXpert MRSA Assay (FDA approved for NASAL specimens only), is one component of a comprehensive MRSA colonization surveillance program. It is not intended to diagnose MRSA infection nor to guide or monitor treatment for MRSA infections. RESULT CALLED TO, READ BACK BY AND VERIFIED WITH: HEAVNER,L. RN @1848  06/03/20 BILLINGSLEY,L Performed at [facility], [place] [address], [place], [place] [address]     Radiology Studies: No results found.    Derel Mcglasson T. Swink  If 7PM-7AM, please contact night-coverage [email or web address] 06/08/2020, 3:27 PM

## 2020-06-08 NOTE — Progress Notes (Addendum)
Initial Nutrition Assessment  RD working remotely.  DOCUMENTATION CODES:   Not applicable  INTERVENTION:  Recommend liberalizing diet to carbohydrate modified. Can continue 1.2 L fluid restrictions as previously ordered by MD.  Provide Ensure Plus po TID, each supplement provides 350 kcal and 16 grams of protein.  Provide Magic cup TID with meals, each supplement provides 290 kcal and 9 grams of protein.  Recommend MVI po daily. Unable to order as it had a contraindication due to patient's red dye allergy.  NUTRITION DIAGNOSIS:   Inadequate oral intake related to decreased appetite as evidenced by per patient/family report.  GOAL:   Patient will meet greater than or equal to 90% of their needs  MONITOR:   PO intake, Supplement acceptance, Labs, Weight trends, Skin, I & O's  REASON FOR ASSESSMENT:   Consult Assessment of nutrition requirement/status  ASSESSMENT:   74 year old female with PMHx of CAD, HTN, HLD, GERD, H pyloric infection, gout, MI, sleep apnea, DM, mass left kidney s/p nephrectomy 2010, ESRD s/p kidney transplant 02/2011, A-fib, severe aortic stenosis s/p AVR with bioprosthetic valve and MAZE procedure 2014, adenocarcinoma of lung stage 1 (declined resection), CHF, embolic CVA with residual aphasia, bilateral lower extremity nonhealing wounds admitted with sepsis from suspected left lower extremity purulent cellulitis and proteus UTI, acute on chronic heart failure exacerbation.   Noted per chart patient with expressive aphasia. She has answered some providers with one-word answers. Placed call to room phone to see if husband would answer if he was in room but did not receive answer. Called husband's cell phone and spoke with him over the phone. He reports patient's appetite has been decreased for about one week now. She was still eating but was eating less at meals. Noted on 11/18 patient ate 75% of breakfast and lunch and 50% of dinner. Husband reports she did not  have anything to eat yesterday. Noted she also did not take any PO pain medications yesterday. Husband reports pain is contributing to poor PO intake. He is hopeful the fentanyl patch will help control pain so she can eat more. Husband reports patient typically drinks Ensure regularly at home but did not want any yesterday.   Husband reports UBW was around 150 lbs. Per review of chart patient was 72.6 kg (159.72 lbs) on 05/22/2020. Patient currently documented to be 80.8 kg (178.13 lbs). Suspect current weight elevated from edema (documented to have moderate-deep pitting edema), which will mask any true weight loss.   Medications reviewed and include: fentanyl patch, Novolog 0-9 units TID, prednisone 5 mg daily, torsemide, heparin infusion, D10 at 50 mL/hr.  Labs reviewed: CBG 93-116, Chloride 97, BUN 39, Creatinine 2.48.  Unable to determine if patient meets criteria for malnutrition at this time.  Per MD okay to liberalize diet to carbohydrate modified with fluid restriction.  NUTRITION - FOCUSED PHYSICAL EXAM:  Unable to complete at this time as RD is working remotely.  Diet Order:   Diet Order            Diet renal/carb modified with fluid restriction Diet-HS Snack? Nothing; Fluid restriction: 1200 mL Fluid; Room service appropriate? Yes; Fluid consistency: Thin  Diet effective now                EDUCATION NEEDS:   No education needs have been identified at this time  Skin:  Skin Assessment: Skin Integrity Issues: Skin Integrity Issues:: Other (Comment) Other: venous stasis ulcers bilateral legs  Last BM:  06/07/2020 - large  type 1  Height:   Ht Readings from Last 1 Encounters:  06/04/20 5\' 1"  (1.549 m)   Weight:   Wt Readings from Last 1 Encounters:  06/08/20 80.8 kg   BMI:  Body mass index is 33.66 kg/m.  Estimated Nutritional Needs:   Kcal:  2000-2200  Protein:  85-100 grams  Fluid:  1.2 L/day per MD  Jacklynn Barnacle, MS, RD, LDN Pager number available on  Amion

## 2020-06-08 NOTE — Progress Notes (Addendum)
Daily Progress Note   Patient Name: [name]       Date: 06/08/2020 DOB: March 07, 1946  Age: 74 y.o. MRN#: [id number] Attending Physician: Mercy Riding, MD Primary Care Physician: [name], MD Admit Date: 06/03/2020  Reason for Consultation/Follow-up: Establishing goals of care and Pain control  Subjective:  I saw and examined [name] today.  She was lying in bed in no distress at time of my encounter, however, she just received IV Dilaudid.  I discussed with bedside RN who reports that she has been continuing to refuse any oral pain medications when asked.  This includes refusing scheduled Tylenol and oral rescue medication.  She has been taking long-acting OxyContin (she has been confused and may not realize this is also pain medication which is why she has been accepting) but refusing all other oral pain medications.  I attempted to talk with her about this but she simply stated "no" and turned away and closed her eyes.  Her husband has told me that she is afraid of becoming addicted to pain medications which is why she refuses whenever she is asked if she needs anything.  I discussed with Dr. Wyline Copas and bedside RN.  Dr. Wyline Copas suggested we consider placing a fentanyl patch as this would allow baseline pain control while avoiding pills that she has been refusing.  I agree this would likely serve her well and called and discussed with her husband, [name].  [name] again stated that [name] does not understand usage of pain medications and that these are necessary to improve her pain to hopefully improve her functional status.  He tells me that she is always been worried about becoming "addicted or hooked on anything."  [name] feels that she would be better served by utilizing a patch as well  as he knows that she is hurting all the time.  This is also been clearly documented by staff and other providers that have visited with her.  Length of Stay: 5  Current Medications: Scheduled Meds:  . acetaminophen  650 mg Oral Q6H  . amLODipine  5 mg Oral Daily  . Chlorhexidine Gluconate Cloth  6 each Topical Q0600  . collagenase   Topical Daily  . fentaNYL  1 patch Transdermal Q72H  . insulin aspart  0-9 Units Subcutaneous TID WC  .  linezolid  600 mg Oral Q12H  . metoprolol tartrate  25 mg Oral BID  . mupirocin ointment  1 application Nasal BID  . predniSONE  5 mg Oral QAC breakfast  . sodium chloride flush  3 mL Intravenous Q12H  . tacrolimus  3 mg Oral BID  . torsemide  100 mg Oral Daily    Continuous Infusions: . dextrose 50 mL/hr at 06/07/20 1846  . heparin 1,650 Units/hr (06/07/20 1846)    PRN Meds: HYDROmorphone (DILAUDID) injection, oxyCODONE, polyethylene glycol  Physical Exam    General:  Acknowledges me in room but refuses to engage in conversation.  Became agitated and disengaged when I attempt to discuss her pain regimen.   HEENT: No bruits, no goiter, no JVD Heart: Regular rate and rhythm. No murmur appreciated. Lungs: Good air movement, clear Abdomen: Soft, nontender, nondistended, positive bowel sounds.  Skin: Warm and dry Neuro: Does not really follow commands reliably      Ext: Left arm notably swollen and warm and hand versus right arm  Vital Signs: BP (!) 147/84 (BP Location: Right Arm)   Pulse 61   Temp 97.9 F (36.6 C) (Axillary)   Resp (!) 22   Ht 5\' 1"  (1.549 m)   Wt 80.8 kg   SpO2 100%   BMI 33.66 kg/m  SpO2: SpO2: 100 % O2 Device: O2 Device: Nasal Cannula O2 Flow Rate: O2 Flow Rate (L/min): 2 L/min  Intake/output summary:   Intake/Output Summary (Last 24 hours) at 06/08/2020 0817 Last data filed at 06/08/2020 0300 Gross per 24 hour  Intake 1710 ml  Output --  Net 1710 ml   LBM: Last BM Date:  (PTA) Baseline Weight: Weight: 82  kg Most recent weight: Weight: 80.8 kg       Palliative Assessment/Data:    Flowsheet Rows     Most Recent Value  Intake Tab  Referral Department Hospitalist  Unit at Time of Referral Med/Surg Unit  Clinical Assessment  Psychosocial & Spiritual Assessment  Palliative Care Outcomes      Patient Active Problem List   Diagnosis Date Noted  . Pulmonary hypertension, unspecified ([place])   . Atrial fibrillation with rapid ventricular response ([place]) 06/03/2020  . Sepsis ([place]) 06/03/2020  . ESRD (end stage renal disease) ([place])   . Venous stasis ulcers of both lower extremities ([place]) 01/31/2020  . Venous stasis dermatitis of both lower extremities 01/31/2020  . Claudication of both lower extremities ([place]) 01/31/2020  . Chronic venous insufficiency 12/31/2019  . Heart failure ([place]) 10/02/2019  . Dyspnea 08/20/2019  . COPD (chronic obstructive pulmonary disease) ([place]) 08/20/2019  . Hematoma of arm, left, subsequent encounter 07/23/2019  . Obesity (BMI 30-39.9) 12/20/2018  . Acute respiratory failure with hypoxia ([place]) 12/19/2018  . Gait disorder 11/22/2018  . History of seizure disorder 09/12/2018  . History of stroke 09/12/2018  . Edema 08/28/2018  . Hypertension secondary to other renal disorders 03/22/2018  . Proteinuria 03/22/2018  . Malignant neoplasm of lung ([place]) 03/22/2018  . Breast pain in female 02/04/2018  . Lung cancer ([place]) 11/30/2017  . Adenocarcinoma of lung, stage 1, right ([place]) 11/25/2017  . Chronic respiratory failure with hypoxia ([place]) 12/27/2016  . Acute on chronic diastolic CHF (congestive heart failure) ([place]) 12/27/2016  . Left upper extremity swelling 12/27/2016  . Pain of left breast 12/27/2016  . Arm muscle atrophy 11/16/2016  . Insomnia 02/25/2016  . Renal cyst 11/21/2015  . Renal mass, right 11/10/2015  . Shoulder pain, right 08/13/2015  .  Weight gain 08/12/2014  . Multinodular goiter 05/01/2014  . Encounter for therapeutic drug monitoring  08/14/2013  . S/P aortic valve replacement with bioprosthetic valve and maze procedure 04/12/2013  . Nodule of right lung 04/07/2013  . Nodule of left lung 04/07/2013  . Pain in limb 05/22/2012  . Dermatitis 01/26/2012  . Long term (current) use of anticoagulants 08/17/2011  . Anemia in chronic renal disease 05/13/2011  . Hypokalemia 04/26/2011  . Immunosuppression ([place]) 04/26/2011  . Hypertension associated with diabetes ([place]) 04/26/2011  . Renal transplant recipient 03/16/2011  . Low back pain 02/23/2011  . Hypersalivation 11/24/2010  . AORTIC STENOSIS 08/20/2010  . DISTURBANCE OF SALIVARY SECRETION 07/28/2010  . NAUSEA 04/14/2010  . SMOKER 10/07/2009  . ALOPECIA 10/07/2009  . CAROTID STENOSIS 03/04/2009  . AF (paroxysmal atrial fibrillation) ([place]) 12/31/2008  . NEOPLASM, MALIGNANT, KIDNEY 07/02/2008  . APHASIA DUE TO CEREBROVASCULAR DISEASE 07/02/2008  . Chronic fatigue 05/21/2008  . Hyperlipidemia LDL goal <70 09/27/2007  . Constipation 09/27/2007  . SLEEP APNEA 09/27/2007  . CHOLELITHIASIS 06/09/2007  . HELICOBACTER PYLORI INFECTION, HX OF 06/08/2007  . Type 2 diabetes mellitus with chronic kidney disease, with long-term current use of insulin ([place]) 05/23/2007  . Gout 05/23/2007  . 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 MYOCARDIAL INFARCTION, HX OF 05/23/2007  . Coronary atherosclerosis 05/23/2007  . GERD 05/23/2007  . COLONIC POLYPS, HX OF 05/23/2007  . DIVERTICULOSIS, COLON 07/01/2005    Palliative Care Assessment & Plan   Patient Profile: 74 y.o. female  with past medical history of CAD status post stent, severe aortic stenosis status post AVR, hypertension, hyperlipidemia, OSA, COPD with intermittent O2 requirement, HFpEF, atrial fibrillation status post maze on Coumadin, type 2 diabetes, end-stage renal disease status post transplant, CKD 4, adenocarcinoma of the lung, embolic CVA with residual aphasia, bilateral lower extremity nonhealing wounds and  recent abdominal aortogram with out actionable findings (would likely need eventual AKA) admitted on 06/03/2020 with sepsis, concern for endocarditis, A. fib and worsening renal failure.  Palliative consulted for goals of care.  Assessment: Patient Active Problem List   Diagnosis Date Noted  . Pulmonary hypertension, unspecified ([place])   . Atrial fibrillation with rapid ventricular response ([place]) 06/03/2020  . Sepsis ([place]) 06/03/2020  . ESRD (end stage renal disease) ([place])   . Venous stasis ulcers of both lower extremities ([place]) 01/31/2020  . Venous stasis dermatitis of both lower extremities 01/31/2020  . Claudication of both lower extremities ([place]) 01/31/2020  . Chronic venous insufficiency 12/31/2019  . Heart failure ([place]) 10/02/2019  . Dyspnea 08/20/2019  . COPD (chronic obstructive pulmonary disease) ([place]) 08/20/2019  . Hematoma of arm, left, subsequent encounter 07/23/2019  . Obesity (BMI 30-39.9) 12/20/2018  . Acute respiratory failure with hypoxia ([place]) 12/19/2018  . Gait disorder 11/22/2018  . History of seizure disorder 09/12/2018  . History of stroke 09/12/2018  . Edema 08/28/2018  . Hypertension secondary to other renal disorders 03/22/2018  . Proteinuria 03/22/2018  . Malignant neoplasm of lung ([place]) 03/22/2018  . Breast pain in female 02/04/2018  . Lung cancer ([place]) 11/30/2017  . Adenocarcinoma of lung, stage 1, right ([place]) 11/25/2017  . Chronic respiratory failure with hypoxia ([place]) 12/27/2016  . Acute on chronic diastolic CHF (congestive heart failure) ([place]) 12/27/2016  . Left upper extremity swelling 12/27/2016  . Pain of left breast 12/27/2016  . Arm muscle atrophy 11/16/2016  . Insomnia 02/25/2016  . Renal cyst 11/21/2015  . Renal mass, right 11/10/2015  . Shoulder pain,  right 08/13/2015  . Weight gain 08/12/2014  . Multinodular goiter 05/01/2014  . Encounter for therapeutic drug monitoring 08/14/2013  . S/P aortic valve replacement with bioprosthetic valve  and maze procedure 04/12/2013  . Nodule of right lung 04/07/2013  . Nodule of left lung 04/07/2013  . Pain in limb 05/22/2012  . Dermatitis 01/26/2012  . Long term (current) use of anticoagulants 08/17/2011  . Anemia in chronic renal disease 05/13/2011  . Hypokalemia 04/26/2011  . Immunosuppression ([place]) 04/26/2011  . Hypertension associated with diabetes ([place]) 04/26/2011  . Renal transplant recipient 03/16/2011  . Low back pain 02/23/2011  . Hypersalivation 11/24/2010  . AORTIC STENOSIS 08/20/2010  . DISTURBANCE OF SALIVARY SECRETION 07/28/2010  . NAUSEA 04/14/2010  . SMOKER 10/07/2009  . ALOPECIA 10/07/2009  . CAROTID STENOSIS 03/04/2009  . AF (paroxysmal atrial fibrillation) ([place]) 12/31/2008  . NEOPLASM, MALIGNANT, KIDNEY 07/02/2008  . APHASIA DUE TO CEREBROVASCULAR DISEASE 07/02/2008  . Chronic fatigue 05/21/2008  . Hyperlipidemia LDL goal <70 09/27/2007  . Constipation 09/27/2007  . SLEEP APNEA 09/27/2007  . CHOLELITHIASIS 06/09/2007  . HELICOBACTER PYLORI INFECTION, HX OF 06/08/2007  . Type 2 diabetes mellitus with chronic kidney disease, with long-term current use of insulin ([place]) 05/23/2007  . Gout 05/23/2007  . 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 MYOCARDIAL INFARCTION, HX OF 05/23/2007  . Coronary atherosclerosis 05/23/2007  . GERD 05/23/2007  . COLONIC POLYPS, HX OF 05/23/2007  . DIVERTICULOSIS, COLON 07/01/2005    Recommendations/Plan: - Pain: Related to ischemic disease which is very difficult to treat.  I reviewed the [facility] and she has had the equivalent of 90 mg of oral morphine over the last 24 hours (1 mg of IV Dilaudid x3 doses as well as 10 mg of OxyContin x2 doses).  She has been in uncontrolled pain all day but refuses to take pain medications.  Discussed with the patient's husband and Dr. Wyline Copas and will plan for addition of fentanyl patch at 25 mcg/h and stopping OxyContin.  Transition to fentanyl also make sense as this would be better  metabolized with her worsening renal function as well.  I had also scheduled on Tylenol 650 four times daily but she continues to refuse.  We will continue to encourage oxycodone 5 mg every 4 hours as needed for breakthrough pain but she has refused oral pain medications in general.  I talked with her husband about pain management and addition of fentanyl patch.  Also discussed continued concern that her pain may continue to be uncontrolled.  Discussed prior vascular surgery recommendation for consideration of amputation if pain cannot be managed otherwise. -Left upper extremity swelling: Ultrasound negative - GOC: Her husband reports that he agrees with placement for rehab if there is specific goal in mind to help benefit her long-term.  He does worry about her going to rehab as she has had bad experiences in the past.  We discussed long-term goals as well and I recommended continuing to consider what is the help with any potential medical interventions.  He is going to continue to consider this and I will continue to try to progress progress conversation while she is here inpatient.  She will likely benefit from outpatient palliative care services on discharge.  Goals of Care and Additional Recommendations:  Limitations on Scope of Treatment: Full Scope Treatment  Code Status:    Code Status Orders  (From admission, onward)         Start     Ordered   06/03/20  4174  Full code  Continuous        06/03/20 0918        Code Status History    Date Active Date Inactive Code Status Order ID Comments User Context   05/30/2020 1320 05/30/2020 2355 Full Code [id number]  Elam Dutch, MD Inpatient   10/01/2019 2327 10/11/2019 1851 Full Code [id number]  Etta Quill, DO ED   12/19/2018 2042 12/26/2018 1751 Full Code [id number]  [place] Eric J, DO Inpatient   02/04/2018 0244 02/08/2018 1925 Full Code [id number]  Vianne Bulls, MD ED   12/27/2016 2146 01/01/2017 1532 Full Code [id number]  Ivor Costa, MD  ED   04/12/2013 1330 04/23/2013 1855 Full Code [id number]  Rexene Alberts, MD Inpatient   Advance Care Planning Activity    Advance Directive Documentation     Most Recent Value  Type of Advance Directive Healthcare Power of Attorney  Pre-existing out of facility DNR order (yellow form or pink MOST form) --  "MOST" Form in Place? --       Prognosis:   Unable to determine  Discharge Planning:  [place] for rehab with Palliative care service follow-up  Care plan was discussed with husband  Thank you for allowing the Palliative Medicine Team to assist in the care of this patient.   Time In: 1720 ` 1805 Total Time 45 Prolonged Time Billed No      Greater than 50%  of this time was spent counseling and coordinating care related to the above assessment and plan.  Micheline Rough, MD  Please contact Palliative Medicine Team phone at [phone] for questions and concerns.

## 2020-06-08 NOTE — Progress Notes (Signed)
[facility] for heparin Indication: hx bioprosthetic AVR, stroke, afib  Allergies  Allergen Reactions  . Ibuprofen Nausea And Vomiting  . Sulfamethoxazole-Trimethoprim Itching, Swelling and Rash    Swelling of the face  . Sulfonamide Derivatives Itching, Swelling and Rash    Swelling of the face  . Tape Rash    Paper tape is ok  . Tramadol Nausea And Vomiting  . Doxycycline Nausea Only  . Hydrocil [Psyllium] Nausea And Vomiting  . Bactrim Itching, Swelling and Rash  . Red Dye Itching and Rash   Patient Measurements: Height: 5\' 1"  (154.9 cm) Weight: 80.8 kg (178 lb 2.1 oz) IBW/kg (Calculated) : 47.8 Heparin Dosing Weight: 82 kg  Vital Signs: Temp: 97.9 F (36.6 C) (11/21 0410) Temp Source: Axillary (11/21 0410) BP: 147/84 (11/21 0410) Pulse Rate: 61 (11/21 0410)  Labs: Recent Labs    06/05/20 0934 06/05/20 1159 06/06/20 0440 06/06/20 0440 06/07/20 0450 06/07/20 1604 06/08/20 0350 06/08/20 0404  HGB  --   --  11.6*  --  11.1*  --   --   --   HCT  --   --  39.2  --  37.5  --   --   --   PLT  --   --  186  --  193  --   --   --   HEPARINUNFRC   < >  --  0.37   < > 0.20* 0.43  --  0.37  CREATININE  --   --  2.64*  --  2.48*  --  2.48*  --   CKTOTAL  --  98  --   --   --   --   --   --    < > = values in this interval not displayed.    Estimated Creatinine Clearance: 18.6 mL/min (A) (by C-G formula based on SCr of 2.48 mg/dL (H)).   Medications:  - on warfarin PTA  Assessment: Patient is a 74 y.o F with hx bioprosthetic AVR, stroke, and afib on warfarin PTA presented to the ED on 11/16 with fever. Pharmacy is consulted to transition to heparin drip on admission.  Today, 06/08/2020:  HL 0.37 therapeutic  Stable CBC (11/20)  SCr still elevated  No bleeding noted   Goal of Therapy:  Heparin level 0.3-0.7 units/ml Monitor platelets by anticoagulation protocol: Yes   Plan:   Continue Heparin infusion at 1650  units/hr  Daily heparin level & CBC  Monitor for s/s bleeding  Dolly Rias RPh 06/08/2020, 5:27 AM

## 2020-06-08 NOTE — Progress Notes (Signed)
Progress Note  Patient Name: [name] Date of Encounter: 06/08/2020  [facility] Cardiologist: [name], MD   Subjective   Appears uncomfortable.  BP stable 147/84.  SPO2 100% on 2 L.  Renal function stable (2.6 > 2.7 > 2.6 >2.5>2.5).    Inpatient Medications    Scheduled Meds: . acetaminophen  650 mg Oral Q6H  . amLODipine  5 mg Oral Daily  . Chlorhexidine Gluconate Cloth  6 each Topical Q0600  . collagenase   Topical Daily  . fentaNYL  1 patch Transdermal Q72H  . insulin aspart  0-9 Units Subcutaneous TID WC  . linezolid  600 mg Oral Q12H  . metoprolol tartrate  25 mg Oral BID  . mupirocin ointment  1 application Nasal BID  . predniSONE  5 mg Oral QAC breakfast  . sodium chloride flush  3 mL Intravenous Q12H  . tacrolimus  3 mg Oral BID  . torsemide  100 mg Oral Daily   Continuous Infusions: . dextrose 50 mL/hr at 06/07/20 1846  . heparin 1,650 Units/hr (06/08/20 0840)   PRN Meds: HYDROmorphone (DILAUDID) injection, oxyCODONE, polyethylene glycol   Vital Signs    Vitals:   06/07/20 1425 06/07/20 2016 06/08/20 0410 06/08/20 0532  BP: (!) 146/93 (!) 141/83 (!) 147/84   Pulse: 86 79 61   Resp: 16 18 (!) 22   Temp: 98.4 F (36.9 C) 98.1 F (36.7 C) 97.9 F (36.6 C)   TempSrc: Oral Oral Axillary   SpO2: 93% 98% 100%   Weight:    80.8 kg  Height:        Intake/Output Summary (Last 24 hours) at 06/08/2020 0936 Last data filed at 06/08/2020 0300 Gross per 24 hour  Intake 1710 ml  Output --  Net 1710 ml   Last 3 Weights 06/08/2020 06/07/2020 06/05/2020  Weight (lbs) 178 lb 2.1 oz 178 lb 2.1 oz 187 lb 9.8 oz  Weight (kg) 80.8 kg 80.8 kg 85.1 kg      Telemetry    Atrial fibrillation.  Rate 80s.   - Personally Reviewed  ECG    n/a - Personally Reviewed  Physical Exam   VS:  BP (!) 147/84 (BP Location: Right Arm)   Pulse 61   Temp 97.9 F (36.6 C) (Axillary)   Resp (!) 22   Ht 5\' 1"  (1.549 m)   Wt 80.8 kg   SpO2 100%   BMI 33.66  kg/m  , BMI Body mass index is 33.66 kg/m. GEN: Appears uncomfortable Neck: + JVD Cardiac: irregular, normal rate, no murmurs Respiratory:  clear to auscultation bilaterally GI: soft, nontender MS: 1+ LE edema Skin: warm and dry Neuro:  Not following commands Psych: Unable to assess   Labs    High Sensitivity Troponin:   Recent Labs  Lab 06/03/20 0619 06/03/20 0815 06/03/20 1009  TROPONINIHS 161* 151* 174*      Chemistry Recent Labs  Lab 06/03/20 0407 06/04/20 0423 06/06/20 0440 06/07/20 0450 06/08/20 0350  NA 141   < > 139 141 141  K 4.6   < > 3.2* 2.9* 3.5  CL 100   < > 95* 96* 97*  CO2 30   < > 32 33* 29  GLUCOSE 239*   < > 128* 81 108*  BUN 61*   < > 44* 41* 39*  CREATININE 2.92*   < > 2.64* 2.48* 2.48*  CALCIUM 10.2   < > 9.4 9.4 9.5  PROT 7.2  --   --   --  6.4*  ALBUMIN 2.8*  --   --   --  2.4*  AST 23  --   --   --  54*  ALT 15  --   --   --  18  ALKPHOS 136*  --   --   --  672*  BILITOT 1.1  --   --   --  1.0  GFRNONAA 16*   < > 18* 20* 20*  ANIONGAP 11   < > 12 12 15    < > = values in this interval not displayed.     Hematology Recent Labs  Lab 06/05/20 0034 06/06/20 0440 06/07/20 0450  WBC 14.5* 13.4* 13.3*  RBC 5.19* 4.85 4.71  HGB 12.4 11.6* 11.1*  HCT 42.2 39.2 37.5  MCV 81.3 80.8 79.6*  MCH 23.9* 23.9* 23.6*  MCHC 29.4* 29.6* 29.6*  RDW 21.4* 20.6* 20.2*  PLT 168 186 193    BNP Recent Labs  Lab 06/03/20 0619  BNP 1,883.6*     DDimer No results for input(s): DDIMER in the last 168 hours.   Radiology    VAS [place] UPPER EXTREMITY VENOUS DUPLEX  Result Date: 06/07/2020 UPPER VENOUS STUDY  Indications: Swelling Comparison Study: 10/04/19 previous Performing Technologist: Abram Sander RVS  Examination Guidelines: A complete evaluation includes B-mode imaging, spectral Doppler, color Doppler, and power Doppler as needed of all accessible portions of each vessel. Bilateral testing is considered an integral part of a complete  examination. Limited examinations for reoccurring indications may be performed as noted.  Left Findings: +----------+------------+---------+-----------+----------+-------+ LEFT      CompressiblePhasicitySpontaneousPropertiesSummary +----------+------------+---------+-----------+----------+-------+ IJV           Full       Yes       Yes                      +----------+------------+---------+-----------+----------+-------+ Subclavian    Full       Yes       Yes                      +----------+------------+---------+-----------+----------+-------+ Axillary      Full       Yes       Yes                      +----------+------------+---------+-----------+----------+-------+ Brachial                 Yes       Yes                      +----------+------------+---------+-----------+----------+-------+ Radial        Full                                          +----------+------------+---------+-----------+----------+-------+ Ulnar         Full                                          +----------+------------+---------+-----------+----------+-------+ Cephalic      Full                                          +----------+------------+---------+-----------+----------+-------+  Basilic                                             fistula +----------+------------+---------+-----------+----------+-------+  Summary:  Left: No evidence of deep vein thrombosis in the upper extremity. No evidence of superficial vein thrombosis in the upper extremity. No evidence of thrombosis in the subclavian.  *See table(s) above for measurements and observations.  Diagnosing physician: Deitra Mayo MD Electronically signed by Deitra Mayo MD on 06/07/2020 at 7:53:42 PM.    Final     Cardiac Studies   Echo 06/03/20: 1. Left ventricular ejection fraction, by estimation, is 55 to 60%. The  left ventricle has normal function. The left ventricle demonstrates    regional wall motion abnormalities (see scoring diagram/findings for  description). There is mild left ventricular  hypertrophy. Left ventricular diastolic parameters are indeterminate.  2. Right ventricular systolic function is moderately reduced. The right  ventricular size is moderately enlarged. There is severely elevated  pulmonary artery systolic pressure. The estimated right ventricular  systolic pressure is 98.3 mmHg.  3. Left atrial size was severely dilated.  4. Right atrial size was moderately dilated.  5. Suspect small flail segment on anterior mitral leaflet causing very  eccentric posteriorly directed mitral valve regurgitation that appears at  least moderate-severe. From short axis view may involve A3 scallop.. The  mitral valve is abnormal. Moderate  to severe mitral valve regurgitation. Severe mitral annular calcification.  6. Tricuspid valve regurgitation is severe.  7. The aortic valve has been repaired/replaced. Aortic valve  regurgitation is not visualized. There is a 23 mm Edwards bovine valve  present in the aortic position. Procedure Date: 04/07/2013. Echo findings  are consistent with normal structure and  function of the aortic valve prosthesis. Aortic valve mean gradient  measures 18.2 mmHg.  8. The inferior vena cava is dilated in size with <50% respiratory  variability, suggesting right atrial pressure of 15 mmHg.   Patient Profile     74 y.o. female with CAD s/p PCI, AS s/p bioprosthetic AVR, ESRD status post renal transplant, CKD 4, severe pulmonary hypertensin, embolic stroke with expressive aphasia, diabetes, hypertension, hyperlipidemia, OSA, COPD, atrial fibrillation status post Maze procedure, chronic diastolic heart failure, PAD, and lung adenocarcinoma that is untreated admitted with lower extremity ulcers, sepsis secondary to cellulitis, and atrial fibrillation with rapid ventricular response.    Assessment & Plan    # Afib with  RVR: Rates are now well-controlled on metoprolol.  On IV heparin and warfarin on hold.  Resume once it is clear she won't need procedures.   # Acute on chronic diastolic heart failure: # Severe TR: # moderate to severe MR:  Flail mitral valve leaflet with moderate to severe TR.  Diuresis for now.  Given that she has untreated malignancy, suspect she would not be a candidate for MitraClip.  She wouldn't be a candidate for open repair.  Continue to manage with diuretics.  Continue PO torsemide  # Hypertension:  Continue amlodipine and metoprolol.    # CAD s/p PCI: # PAD:  Diffuse calcification noted on CT.  Not on aspirin presumably b/c she is on warfarin at baseline.  LDL 32, not on statin  # CKD IV:  # S/p renal transplant: Renal function stable with diuresis.  # Sepsis 2/2 cellulitis: CT showed diffuse myofasciitis.  Antibiotics per IM.    #  s/p AVR:  Bioprosthetic AV.    # Hypokalemia: K 2.9 yesterday, improved today with repletion      For questions or updates, please contact [place] Please consult [email or web address] for contact info under        Signed, Donato Heinz, MD  06/08/2020, 9:36 AM

## 2020-06-08 NOTE — Progress Notes (Signed)
Daily Progress Note   Patient Name: [name]       Date: 06/08/2020 DOB: 25-Mar-1946  Age: 74 y.o. MRN#: [id number] Attending Physician: Mercy Riding, MD Primary Care Physician: [name], MD Admit Date: 06/03/2020  Reason for Consultation/Follow-up: Establishing goals of care and Pain control  Subjective:  I saw and examined [name] today.  She was lying in bed in no distress at time of my encounter.  Chart review reveals that she was uncomfortable earlier today.  She said yes to a couple simple questions but again declined to interact when I talked with her about her pain regimen.  I called was able to reach her husband, [name].  We discussed again regarding pain management as well as working to determine long-term goals of care for [name] moving forward.  He reports understanding that this is a complicated situation with multiple factors contributing to her overall situation.  His son and daughter are traveling in from out of town tomorrow and he would like to have a family meeting in order to discuss next steps moving forward.  Length of Stay: 5  Current Medications: Scheduled Meds:  . acetaminophen  650 mg Oral Q6H  . amLODipine  5 mg Oral Daily  . Chlorhexidine Gluconate Cloth  6 each Topical Q0600  . collagenase   Topical Daily  . feeding supplement  237 mL Oral TID BM  . fentaNYL  1 patch Transdermal Q72H  . insulin aspart  0-9 Units Subcutaneous TID WC  . linezolid  600 mg Oral Q12H  . metoprolol tartrate  25 mg Oral BID  . mupirocin ointment  1 application Nasal BID  . predniSONE  5 mg Oral QAC breakfast  . sodium chloride flush  3 mL Intravenous Q12H  . tacrolimus  3 mg Oral BID  . torsemide  100 mg Oral Daily    Continuous Infusions: . dextrose  50 mL/hr at 06/07/20 1846  . heparin 1,650 Units/hr (06/08/20 0840)    PRN Meds: HYDROmorphone (DILAUDID) injection, oxyCODONE, polyethylene glycol  Physical Exam    General:  Acknowledges me in room but does not engage in conversation.  Became agitated and disengaged when I attempt to discuss her pain regimen.   HEENT: No bruits, no goiter, no JVD Heart: Regular rate and rhythm. No murmur  appreciated. Lungs: Good air movement, clear Abdomen: Soft, nontender, nondistended, positive bowel sounds.  Skin: Warm and dry Neuro: Does not really follow commands reliably      Ext: Left arm notably swollen and warm and hand versus right arm  Vital Signs: BP (!) 154/69 (BP Location: Left Arm)   Pulse 67   Temp (!) 97.5 F (36.4 C)   Resp 16   Ht 5\' 1"  (1.549 m)   Wt 80.8 kg   SpO2 (!) 88%   BMI 33.66 kg/m  SpO2: SpO2: (!) 88 % O2 Device: O2 Device: Nasal Cannula O2 Flow Rate: O2 Flow Rate (L/min): 2 L/min  Intake/output summary:   Intake/Output Summary (Last 24 hours) at 06/08/2020 1433 Last data filed at 06/08/2020 1100 Gross per 24 hour  Intake 1660 ml  Output --  Net 1660 ml   LBM: Last BM Date:  (PTA) Baseline Weight: Weight: 82 kg Most recent weight: Weight: 80.8 kg       Palliative Assessment/Data:    Flowsheet Rows     Most Recent Value  Intake Tab  Referral Department Hospitalist  Unit at Time of Referral Med/Surg Unit  Clinical Assessment  Psychosocial & Spiritual Assessment  Palliative Care Outcomes      Patient Active Problem List   Diagnosis Date Noted  . Pulmonary hypertension, unspecified ([place])   . Atrial fibrillation with rapid ventricular response ([place]) 06/03/2020  . Sepsis ([place]) 06/03/2020  . ESRD (end stage renal disease) ([place])   . Venous stasis ulcers of both lower extremities ([place]) 01/31/2020  . Venous stasis dermatitis of both lower extremities 01/31/2020  . Claudication of both lower extremities ([place]) 01/31/2020  . Chronic venous  insufficiency 12/31/2019  . Heart failure ([place]) 10/02/2019  . Dyspnea 08/20/2019  . COPD (chronic obstructive pulmonary disease) ([place]) 08/20/2019  . Hematoma of arm, left, subsequent encounter 07/23/2019  . Obesity (BMI 30-39.9) 12/20/2018  . Acute respiratory failure with hypoxia ([place]) 12/19/2018  . Gait disorder 11/22/2018  . History of seizure disorder 09/12/2018  . History of stroke 09/12/2018  . Edema 08/28/2018  . Hypertension secondary to other renal disorders 03/22/2018  . Proteinuria 03/22/2018  . Malignant neoplasm of lung ([place]) 03/22/2018  . Breast pain in female 02/04/2018  . Lung cancer ([place]) 11/30/2017  . Adenocarcinoma of lung, stage 1, right ([place]) 11/25/2017  . Chronic respiratory failure with hypoxia ([place]) 12/27/2016  . Acute on chronic diastolic CHF (congestive heart failure) ([place]) 12/27/2016  . Left upper extremity swelling 12/27/2016  . Pain of left breast 12/27/2016  . Arm muscle atrophy 11/16/2016  . Insomnia 02/25/2016  . Renal cyst 11/21/2015  . Renal mass, right 11/10/2015  . Shoulder pain, right 08/13/2015  . Weight gain 08/12/2014  . Multinodular goiter 05/01/2014  . Encounter for therapeutic drug monitoring 08/14/2013  . S/P aortic valve replacement with bioprosthetic valve and maze procedure 04/12/2013  . Nodule of right lung 04/07/2013  . Nodule of left lung 04/07/2013  . Pain in limb 05/22/2012  . Dermatitis 01/26/2012  . Long term (current) use of anticoagulants 08/17/2011  . Anemia in chronic renal disease 05/13/2011  . Hypokalemia 04/26/2011  . Immunosuppression ([place]) 04/26/2011  . Hypertension associated with diabetes ([place]) 04/26/2011  . Renal transplant recipient 03/16/2011  . Low back pain 02/23/2011  . Hypersalivation 11/24/2010  . AORTIC STENOSIS 08/20/2010  . DISTURBANCE OF SALIVARY SECRETION 07/28/2010  . NAUSEA 04/14/2010  . SMOKER 10/07/2009  . ALOPECIA 10/07/2009  . CAROTID STENOSIS 03/04/2009  .  AF (paroxysmal atrial  fibrillation) ([place]) 12/31/2008  . NEOPLASM, MALIGNANT, KIDNEY 07/02/2008  . APHASIA DUE TO CEREBROVASCULAR DISEASE 07/02/2008  . Chronic fatigue 05/21/2008  . Hyperlipidemia LDL goal <70 09/27/2007  . Constipation 09/27/2007  . SLEEP APNEA 09/27/2007  . CHOLELITHIASIS 06/09/2007  . HELICOBACTER PYLORI INFECTION, HX OF 06/08/2007  . Type 2 diabetes mellitus with chronic kidney disease, with long-term current use of insulin ([place]) 05/23/2007  . Gout 05/23/2007  . 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 MYOCARDIAL INFARCTION, HX OF 05/23/2007  . Coronary atherosclerosis 05/23/2007  . GERD 05/23/2007  . COLONIC POLYPS, HX OF 05/23/2007  . DIVERTICULOSIS, COLON 07/01/2005    Palliative Care Assessment & Plan   Patient Profile: 74 y.o. female  with past medical history of CAD status post stent, severe aortic stenosis status post AVR, hypertension, hyperlipidemia, OSA, COPD with intermittent O2 requirement, HFpEF, atrial fibrillation status post maze on Coumadin, type 2 diabetes, end-stage renal disease status post transplant, CKD 4, adenocarcinoma of the lung, embolic CVA with residual aphasia, bilateral lower extremity nonhealing wounds and recent abdominal aortogram with out actionable findings (would likely need eventual AKA) admitted on 06/03/2020 with sepsis, concern for endocarditis, A. fib and worsening renal failure.  Palliative consulted for goals of care.  Assessment: Patient Active Problem List   Diagnosis Date Noted  . Pulmonary hypertension, unspecified ([place])   . Atrial fibrillation with rapid ventricular response ([place]) 06/03/2020  . Sepsis ([place]) 06/03/2020  . ESRD (end stage renal disease) ([place])   . Venous stasis ulcers of both lower extremities ([place]) 01/31/2020  . Venous stasis dermatitis of both lower extremities 01/31/2020  . Claudication of both lower extremities ([place]) 01/31/2020  . Chronic venous insufficiency 12/31/2019  . Heart failure ([place]) 10/02/2019  .  Dyspnea 08/20/2019  . COPD (chronic obstructive pulmonary disease) ([place]) 08/20/2019  . Hematoma of arm, left, subsequent encounter 07/23/2019  . Obesity (BMI 30-39.9) 12/20/2018  . Acute respiratory failure with hypoxia ([place]) 12/19/2018  . Gait disorder 11/22/2018  . History of seizure disorder 09/12/2018  . History of stroke 09/12/2018  . Edema 08/28/2018  . Hypertension secondary to other renal disorders 03/22/2018  . Proteinuria 03/22/2018  . Malignant neoplasm of lung ([place]) 03/22/2018  . Breast pain in female 02/04/2018  . Lung cancer ([place]) 11/30/2017  . Adenocarcinoma of lung, stage 1, right ([place]) 11/25/2017  . Chronic respiratory failure with hypoxia ([place]) 12/27/2016  . Acute on chronic diastolic CHF (congestive heart failure) ([place]) 12/27/2016  . Left upper extremity swelling 12/27/2016  . Pain of left breast 12/27/2016  . Arm muscle atrophy 11/16/2016  . Insomnia 02/25/2016  . Renal cyst 11/21/2015  . Renal mass, right 11/10/2015  . Shoulder pain, right 08/13/2015  . Weight gain 08/12/2014  . Multinodular goiter 05/01/2014  . Encounter for therapeutic drug monitoring 08/14/2013  . S/P aortic valve replacement with bioprosthetic valve and maze procedure 04/12/2013  . Nodule of right lung 04/07/2013  . Nodule of left lung 04/07/2013  . Pain in limb 05/22/2012  . Dermatitis 01/26/2012  . Long term (current) use of anticoagulants 08/17/2011  . Anemia in chronic renal disease 05/13/2011  . Hypokalemia 04/26/2011  . Immunosuppression ([place]) 04/26/2011  . Hypertension associated with diabetes ([place]) 04/26/2011  . Renal transplant recipient 03/16/2011  . Low back pain 02/23/2011  . Hypersalivation 11/24/2010  . AORTIC STENOSIS 08/20/2010  . DISTURBANCE OF SALIVARY SECRETION 07/28/2010  . NAUSEA 04/14/2010  . SMOKER 10/07/2009  . ALOPECIA 10/07/2009  . CAROTID  STENOSIS 03/04/2009  . AF (paroxysmal atrial fibrillation) ([place]) 12/31/2008  . NEOPLASM, MALIGNANT, KIDNEY  07/02/2008  . APHASIA DUE TO CEREBROVASCULAR DISEASE 07/02/2008  . Chronic fatigue 05/21/2008  . Hyperlipidemia LDL goal <70 09/27/2007  . Constipation 09/27/2007  . SLEEP APNEA 09/27/2007  . CHOLELITHIASIS 06/09/2007  . HELICOBACTER PYLORI INFECTION, HX OF 06/08/2007  . Type 2 diabetes mellitus with chronic kidney disease, with long-term current use of insulin ([place]) 05/23/2007  . Gout 05/23/2007  . 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 MYOCARDIAL INFARCTION, HX OF 05/23/2007  . Coronary atherosclerosis 05/23/2007  . GERD 05/23/2007  . COLONIC POLYPS, HX OF 05/23/2007  . DIVERTICULOSIS, COLON 07/01/2005    Recommendations/Plan: - Pain: Related to ischemic disease which is very difficult to treat.  I started her on a fentanyl patch 25 mcg/h yesterday.  I had also scheduled on Tylenol 650 four times daily but she continues to refuse at times.  We will continue to encourage oxycodone 5 mg every 4 hours as needed for breakthrough pain but she has refused oral pain medications in general.  Also discussed continued concern that her pain may continue to be uncontrolled.  Discussed prior vascular surgery recommendation for consideration of amputation if pain cannot be managed otherwise. -Left upper extremity swelling: Ultrasound negative - GOC: Her husband would like to set up a family meeting with himself, sister, and their children for tomorrow in order to discuss long-term goals moving forward.  I certainly feel this is appropriate and will request one-time visitor examinations in order for them to be able to come to attend family meeting to discuss care plan for Ms. Alphin.  Husband to call me later today versus tomorrow with time for meeting. Goals of Care and Additional Recommendations:  Limitations on Scope of Treatment: Full Scope Treatment  Code Status:    Code Status Orders  (From admission, onward)         Start     Ordered   06/03/20 0919  Full code  Continuous         06/03/20 0918        Code Status History    Date Active Date Inactive Code Status Order ID Comments User Context   05/30/2020 1320 05/30/2020 2355 Full Code [id number]  Elam Dutch, MD Inpatient   10/01/2019 2327 10/11/2019 1851 Full Code [id number]  Etta Quill, DO ED   12/19/2018 2042 12/26/2018 1751 Full Code [id number]  [place] Eric J, DO Inpatient   02/04/2018 0244 02/08/2018 1925 Full Code [id number]  Vianne Bulls, MD ED   12/27/2016 2146 01/01/2017 1532 Full Code [id number]  Ivor Costa, MD ED   04/12/2013 1330 04/23/2013 1855 Full Code [id number]  Rexene Alberts, MD Inpatient   Advance Care Planning Activity    Advance Directive Documentation     Most Recent Value  Type of Advance Directive Healthcare Power of Attorney  Pre-existing out of facility DNR order (yellow form or pink MOST form) --  "MOST" Form in Place? --       Prognosis:   Unable to determine  Discharge Planning:  [facility] for rehab with Palliative care service follow-up  Care plan was discussed with husband, Dr. Cyndia Skeeters  Thank you for allowing the Palliative Medicine Team to assist in the care of this patient.   Time In: 1350 Time Out: 1420 Total Time 30 Prolonged Time Billed No  Greater than 50%  of this time was spent counseling and coordinating care related  to the above assessment and plan.  Micheline Rough, MD  Please contact Palliative Medicine Team phone at [phone] for questions and concerns.

## 2020-06-08 NOTE — Progress Notes (Signed)
Alerted front desk to the Palliative meeting with Dr. Domingo Cocking and this pt's family (4 members), tomorrow November 22. Time unknown at this moment.

## 2020-06-09 DIAGNOSIS — I5033 Acute on chronic diastolic (congestive) heart failure: Secondary | ICD-10-CM | POA: Diagnosis not present

## 2020-06-09 DIAGNOSIS — A419 Sepsis, unspecified organism: Secondary | ICD-10-CM | POA: Diagnosis not present

## 2020-06-09 DIAGNOSIS — I4891 Unspecified atrial fibrillation: Secondary | ICD-10-CM | POA: Diagnosis not present

## 2020-06-09 DIAGNOSIS — N186 End stage renal disease: Secondary | ICD-10-CM | POA: Diagnosis not present

## 2020-06-09 LAB — CBC WITH DIFFERENTIAL/PLATELET
Abs Immature Granulocytes: 0.06 10*3/uL (ref 0.00–0.07)
Basophils Absolute: 0 10*3/uL (ref 0.0–0.1)
Basophils Relative: 0 %
Eosinophils Absolute: 0 10*3/uL (ref 0.0–0.5)
Eosinophils Relative: 0 %
HCT: 36.7 % (ref 36.0–46.0)
Hemoglobin: 11.1 g/dL — ABNORMAL LOW (ref 12.0–15.0)
Immature Granulocytes: 1 %
Lymphocytes Relative: 5 %
Lymphs Abs: 0.6 10*3/uL — ABNORMAL LOW (ref 0.7–4.0)
MCH: 23.6 pg — ABNORMAL LOW (ref 26.0–34.0)
MCHC: 30.2 g/dL (ref 30.0–36.0)
MCV: 77.9 fL — ABNORMAL LOW (ref 80.0–100.0)
Monocytes Absolute: 1.1 10*3/uL — ABNORMAL HIGH (ref 0.1–1.0)
Monocytes Relative: 9 %
Neutro Abs: 10.2 10*3/uL — ABNORMAL HIGH (ref 1.7–7.7)
Neutrophils Relative %: 85 %
Platelets: 219 10*3/uL (ref 150–400)
RBC: 4.71 MIL/uL (ref 3.87–5.11)
RDW: 20 % — ABNORMAL HIGH (ref 11.5–15.5)
WBC: 12.1 10*3/uL — ABNORMAL HIGH (ref 4.0–10.5)
nRBC: 0 % (ref 0.0–0.2)

## 2020-06-09 LAB — RENAL FUNCTION PANEL
Albumin: 2.3 g/dL — ABNORMAL LOW (ref 3.5–5.0)
Anion gap: 11 (ref 5–15)
BUN: 43 mg/dL — ABNORMAL HIGH (ref 8–23)
CO2: 31 mmol/L (ref 22–32)
Calcium: 9.6 mg/dL (ref 8.9–10.3)
Chloride: 95 mmol/L — ABNORMAL LOW (ref 98–111)
Creatinine, Ser: 2.67 mg/dL — ABNORMAL HIGH (ref 0.44–1.00)
GFR, Estimated: 18 mL/min — ABNORMAL LOW (ref >60)
Glucose, Bld: 276 mg/dL — ABNORMAL HIGH (ref 70–99)
Phosphorus: 3.7 mg/dL (ref 2.5–4.6)
Potassium: 4 mmol/L (ref 3.5–5.1)
Sodium: 137 mmol/L (ref 135–145)

## 2020-06-09 LAB — GLUCOSE, CAPILLARY
Glucose-Capillary: 74 mg/dL (ref 70–99)
Glucose-Capillary: 276 mg/dL — ABNORMAL HIGH (ref 70–99)
Glucose-Capillary: 202 mg/dL — ABNORMAL HIGH (ref 70–99)
Glucose-Capillary: 185 mg/dL — ABNORMAL HIGH (ref 70–99)
Glucose-Capillary: 120 mg/dL — ABNORMAL HIGH (ref 70–99)

## 2020-06-09 LAB — MAGNESIUM: Magnesium: 2 mg/dL (ref 1.7–2.4)

## 2020-06-09 LAB — HEPARIN LEVEL (UNFRACTIONATED): Heparin Unfractionated: 0.41 IU/mL (ref 0.30–0.70)

## 2020-06-09 MED ORDER — INSULIN ASPART 100 UNIT/ML ~~LOC~~ SOLN
0.0000 [IU] | Freq: Every day | SUBCUTANEOUS | Status: DC
  Administered 2020-06-12: 2 [IU] via SUBCUTANEOUS

## 2020-06-09 MED ORDER — HYDROMORPHONE HCL 1 MG/ML IJ SOLN
1.0000 mg | Freq: Once | INTRAMUSCULAR | Status: AC
  Administered 2020-06-09: 1 mg via INTRAVENOUS
  Filled 2020-06-09: qty 1

## 2020-06-09 MED ORDER — LIVING WELL WITH DIABETES BOOK
Freq: Once | Status: AC
  Filled 2020-06-09: qty 1

## 2020-06-09 MED ORDER — INSULIN ASPART 100 UNIT/ML ~~LOC~~ SOLN
0.0000 [IU] | Freq: Three times a day (TID) | SUBCUTANEOUS | Status: DC
  Administered 2020-06-09 – 2020-06-11 (×3): 1 [IU] via SUBCUTANEOUS
  Administered 2020-06-11 – 2020-06-12 (×2): 2 [IU] via SUBCUTANEOUS
  Administered 2020-06-12: 3 [IU] via SUBCUTANEOUS
  Administered 2020-06-12: 2 [IU] via SUBCUTANEOUS
  Administered 2020-06-13: 3 [IU] via SUBCUTANEOUS
  Administered 2020-06-13 (×2): 2 [IU] via SUBCUTANEOUS
  Administered 2020-06-14: 1 [IU] via SUBCUTANEOUS
  Administered 2020-06-14: 2 [IU] via SUBCUTANEOUS
  Administered 2020-06-15 – 2020-06-16 (×2): 1 [IU] via SUBCUTANEOUS
  Administered 2020-06-16: 2 [IU] via SUBCUTANEOUS

## 2020-06-09 MED ORDER — INSULIN ASPART 100 UNIT/ML ~~LOC~~ SOLN
2.0000 [IU] | Freq: Three times a day (TID) | SUBCUTANEOUS | Status: DC
  Administered 2020-06-09 – 2020-06-16 (×19): 2 [IU] via SUBCUTANEOUS

## 2020-06-09 NOTE — Progress Notes (Signed)
Received call from husband that family has arrived in town.  He would like to set up a family meeting for tomorrow at 2pm with himself, his two sons, and his sister.  While I am going off service, I will ask another member of the PMT to plan to facilitate family meeting tomorrow.  Micheline Rough, MD [place] Palliative Medicine Team [phone]  NO CHARGE NOTE

## 2020-06-09 NOTE — Progress Notes (Addendum)
Progress Note  Patient Name: [name] Date of Encounter: 06/09/2020  [facility] Cardiologist: [name], MD   Subjective   In rate controlled Afib. Denies chest pain or sob. Palliative care on board, plan for family discussion.  Inpatient Medications    Scheduled Meds: . acetaminophen  650 mg Oral Q6H  . collagenase   Topical Daily  . feeding supplement  237 mL Oral TID BM  . fentaNYL  1 patch Transdermal Q72H  . insulin aspart  0-9 Units Subcutaneous TID WC  . linezolid  600 mg Oral Q12H  . metoprolol tartrate  25 mg Oral BID  . predniSONE  5 mg Oral QAC breakfast  . sodium chloride flush  3 mL Intravenous Q12H  . tacrolimus  3 mg Oral BID  . torsemide  100 mg Oral Daily   Continuous Infusions: . dextrose 50 mL/hr at 06/07/20 1846  . heparin 1,650 Units/hr (06/09/20 0208)   PRN Meds: HYDROmorphone (DILAUDID) injection, oxyCODONE, polyethylene glycol   Vital Signs    Vitals:   06/08/20 1414 06/08/20 2010 06/09/20 0406 06/09/20 0442  BP: (!) 154/69 (!) 152/76 130/64   Pulse: 67 77 74   Resp: 16 17 16    Temp: (!) 97.5 F (36.4 C) 99.2 F (37.3 C) 99.2 F (37.3 C)   TempSrc:  Oral Oral   SpO2: (!) 88% 98% 100%   Weight:    83.6 kg  Height:        Intake/Output Summary (Last 24 hours) at 06/09/2020 0731 Last data filed at 06/08/2020 2200 Gross per 24 hour  Intake 1257.37 ml  Output --  Net 1257.37 ml   Last 3 Weights 06/09/2020 06/08/2020 06/07/2020  Weight (lbs) 184 lb 4.9 oz 178 lb 2.1 oz 178 lb 2.1 oz  Weight (kg) 83.6 kg 80.8 kg 80.8 kg      Telemetry    Afib Afib, HR 70s, PVCs - Personally Reviewed  ECG    No new - Personally Reviewed  Physical Exam   GEN: No acute distress.   Neck: mild JVD Cardiac: Irreg Irreg, + murmur, no rubs, or gallops.  Respiratory: Clear to auscultation bilaterally. GI: Soft, nontender, non-distended  MS: mild to mod lower leg edema; No deformity. Neuro:  Nonfocal  Psych: Normal affect   Labs     High Sensitivity Troponin:   Recent Labs  Lab 06/03/20 0619 06/03/20 0815 06/03/20 1009  TROPONINIHS 161* 151* 174*     Chemistry Recent Labs  Lab 06/03/20 0407 06/04/20 0423 06/07/20 0450 06/08/20 0350 06/09/20 0418  NA 141   < > 141 141 137  K 4.6   < > 2.9* 3.5 4.0  CL 100   < > 96* 97* 95*  CO2 30   < > 33* 29 31  GLUCOSE 239*   < > 81 108* 276*  BUN 61*   < > 41* 39* 43*  CREATININE 2.92*   < > 2.48* 2.48* 2.67*  CALCIUM 10.2   < > 9.4 9.5 9.6  PROT 7.2  --   --  6.4*  --   ALBUMIN 2.8*  --   --  2.4* 2.3*  AST 23  --   --  54*  --   ALT 15  --   --  18  --   ALKPHOS 136*  --   --  672*  --   BILITOT 1.1  --   --  1.0  --   GFRNONAA 16*   < >  20* 20* 18*  ANIONGAP 11   < > 12 15 11    < > = values in this interval not displayed.    Hematology Recent Labs  Lab 06/06/20 0440 06/07/20 0450 06/09/20 0418  WBC 13.4* 13.3* 12.1*  RBC 4.85 4.71 4.71  HGB 11.6* 11.1* 11.1*  HCT 39.2 37.5 36.7  MCV 80.8 79.6* 77.9*  MCH 23.9* 23.6* 23.6*  MCHC 29.6* 29.6* 30.2  RDW 20.6* 20.2* 20.0*  PLT 186 193 219   BNP Recent Labs  Lab 06/03/20 0619  BNP 1,883.6*    DDimer No results for input(s): DDIMER in the last 168 hours.   Radiology    VAS [place] UPPER EXTREMITY VENOUS DUPLEX  Result Date: 06/07/2020 UPPER VENOUS STUDY  Indications: Swelling Comparison Study: 10/04/19 previous Performing Technologist: Abram Sander RVS  Examination Guidelines: A complete evaluation includes B-mode imaging, spectral Doppler, color Doppler, and power Doppler as needed of all accessible portions of each vessel. Bilateral testing is considered an integral part of a complete examination. Limited examinations for reoccurring indications may be performed as noted.  Left Findings: +----------+------------+---------+-----------+----------+-------+ LEFT      CompressiblePhasicitySpontaneousPropertiesSummary +----------+------------+---------+-----------+----------+-------+ IJV            Full       Yes       Yes                      +----------+------------+---------+-----------+----------+-------+ Subclavian    Full       Yes       Yes                      +----------+------------+---------+-----------+----------+-------+ Axillary      Full       Yes       Yes                      +----------+------------+---------+-----------+----------+-------+ Brachial                 Yes       Yes                      +----------+------------+---------+-----------+----------+-------+ Radial        Full                                          +----------+------------+---------+-----------+----------+-------+ Ulnar         Full                                          +----------+------------+---------+-----------+----------+-------+ Cephalic      Full                                          +----------+------------+---------+-----------+----------+-------+ Basilic                                             fistula +----------+------------+---------+-----------+----------+-------+  Summary:  Left: No evidence of deep vein thrombosis in the upper extremity. No evidence of superficial vein thrombosis in the upper  extremity. No evidence of thrombosis in the subclavian.  *See table(s) above for measurements and observations.  Diagnosing physician: Deitra Mayo MD Electronically signed by Deitra Mayo MD on 06/07/2020 at 7:53:42 PM.    Final     Cardiac Studies    Echo 06/03/20: 1. Left ventricular ejection fraction, by estimation, is 55 to 60%. The  left ventricle has normal function. The left ventricle demonstrates  regional wall motion abnormalities (see scoring diagram/findings for  description). There is mild left ventricular  hypertrophy. Left ventricular diastolic parameters are indeterminate.  2. Right ventricular systolic function is moderately reduced. The right  ventricular size is moderately enlarged. There is severely  elevated  pulmonary artery systolic pressure. The estimated right ventricular  systolic pressure is 00.8 mmHg.  3. Left atrial size was severely dilated.  4. Right atrial size was moderately dilated.  5. Suspect small flail segment on anterior mitral leaflet causing very  eccentric posteriorly directed mitral valve regurgitation that appears at  least moderate-severe. From short axis view may involve A3 scallop.. The  mitral valve is abnormal. Moderate  to severe mitral valve regurgitation. Severe mitral annular calcification.  6. Tricuspid valve regurgitation is severe.  7. The aortic valve has been repaired/replaced. Aortic valve  regurgitation is not visualized. There is a 23 mm Edwards bovine valve  present in the aortic position. Procedure Date: 04/07/2013. Echo findings  are consistent with normal structure and  function of the aortic valve prosthesis. Aortic valve mean gradient  measures 18.2 mmHg.  8. The inferior vena cava is dilated in size with <50% respiratory  variability, suggesting right atrial pressure of 15 mmHg.    Patient Profile     74 y.o. female with CAD s/p PCI, AS s/p bioprosthetic AVR, ESRD status post renal transplant, CKD 4, severe pulmonary hypertensin, embolic stroke with expressive aphasia, diabetes, hypertension, hyperlipidemia, OSA, COPD, atrial fibrillation status post Maze procedure, chronic diastolic heart failure, PAD, and lung adenocarcinoma that is untreated admitted with lower extremity ulcers, sepsis secondary to cellulitis, and atrial fibrillation with rapid ventricular response.   Assessment & Plan    Aifb RVR  - rates controlled on metoprolol 25mg  BID - warfarin on hold and on IV heparin>>plan to resume warfarin once there is it's clear she won't need procedures (might need amputation if pain is unbearable per [facility])  Sepsis/cellulitis/non-healing wounds - CT showed diffuse myofascitis - abx per IM  Acute on chronic diastolic heart  failure/severe TR/mild to mod MR - flail mitral valve leaflet with moderate to severe TR - given malignancy not likely a candidate for Mitraclip or open surgery - Diuresis with Torsemide 100mg  daily - I/Os do not appear accurate and weight is going up - creatinine appears stable - has some mild lower leg edema and amlodipine was d/c'd yesterday, lungs clear. With creatinine up today will defer diuresis to MD.   HTN - amlodipine d/c'd yesterday for lower leg edema - metoprolol 25mg  BID - BP today 130/64  CAD s/p PCI/PAD - CT showed diffuse calcification - not on ASA due to warfarin  S/p AVR - stable per recent echo  HLD - LDL 32 - not on statin  CKD stage IV s/p renal transplant - creatinine appears stable 2.48>2.48>2.67  Goals of care - palliative care following  For questions or updates, please contact [facility] Please consult [email or web address] for contact info under    Signed, Cadence Ninfa Meeker, PA-C  06/09/2020, 7:31 AM    The patient was seen, examined  and discussed with Cadence Ninfa Meeker, PA-C   and I agree with the above.   Atrial fibrillation with controlled ventricular rates in 60s and 70s beats per minute, weight slightly up but no significant fluid overload, stable creatinine, continue same dose of torsemide, we will follow closely.  Ena Dawley, MD 06/09/2020

## 2020-06-09 NOTE — Progress Notes (Signed)
Physical Therapy Treatment Patient Details Name: [name] MRN: [id number] DOB: 1946/03/07 Today's Date: 06/09/2020    History of Present Illness 74 y.o. female  with past medical history of CAD status post stent, severe aortic stenosis status post AVR, hypertension, hyperlipidemia, OSA, COPD with intermittent O2 requirement, HFpEF, atrial fibrillation status post maze on Coumadin, type 2 diabetes, end-stage renal disease status post transplant, CKD 4, adenocarcinoma of the lung, embolic CVA with residual aphasia, bilateral lower extremity nonhealing wounds and recent abdominal aortogram with out actionable findings (would likely need eventual AKA) admitted on 06/03/2020 with sepsis, concern for endocarditis, A. fib and worsening renal failure.  Palliative consulted for goals of care.    PT Comments    Patient is lethargic, does arouse and moans when feet are touched. Patient not able to participate in PT for mobility at this time. Noted that a family meeting with Palliative care is to bed had soon.    Follow Up Recommendations  SNF     Equipment Recommendations  None recommended by PT    Recommendations for Other Services       Precautions / Restrictions Precautions Precautions: Fall Precaution Comments: AMS, severe pain of both legs, can not even touch a foot    Mobility  Bed Mobility               General bed mobility comments: Pt. was not able to tolerate EOB sitting. Moaning if attempted to move a leg.  Transfers                 General transfer comment: unable  Ambulation/Gait                 Stairs             Wheelchair Mobility    Modified Rankin (Stroke Patients Only)       Balance                                            Cognition Arousal/Alertness: Lethargic                                            Exercises      General Comments        Pertinent Vitals/Pain Pain  Assessment: Faces Faces Pain Scale: Hurts worst Pain Descriptors / Indicators: Grimacing;Guarding;Crying;Moaning Pain Intervention(s): Limited activity within patient's tolerance;Monitored during session    Home Living                      Prior Function            PT Goals (current goals can now be found in the care plan section) Progress towards PT goals: Not progressing toward goals - comment (patient unable to tolerate mobility due to pain)    Frequency    Min 2X/week      PT Plan Current plan remains appropriate    Co-evaluation              AM-PAC PT "6 Clicks" Mobility   Outcome Measure  Help needed turning from your back to your side while in a flat bed without using bedrails?: Total Help needed moving from lying on your back to sitting on the side of  a flat bed without using bedrails?: Total Help needed moving to and from a bed to a chair (including a wheelchair)?: Total Help needed standing up from a chair using your arms (e.g., wheelchair or bedside chair)?: Total Help needed to walk in hospital room?: Total Help needed climbing 3-5 steps with a railing? : Total 6 Click Score: 6    End of Session   Activity Tolerance: Patient limited by pain;Treatment limited secondary to medical complications (Comment) Patient left: in bed;with call bell/phone within reach;with bed alarm set Nurse Communication: Need for lift equipment PT Visit Diagnosis: Muscle weakness (generalized) (M62.81);Other symptoms and signs involving the nervous system (R29.898);Pain;Other abnormalities of gait and mobility (R26.89) Pain - part of body: Ankle and joints of foot     Time: 8550-1586 PT Time Calculation (min) (ACUTE ONLY): 13 min  Charges:  $Therapeutic Activity: 8-22 mins                     Tresa Endo PT Acute Rehabilitation Services Pager [phone] Office [phone]    Claretha Cooper 06/09/2020, 12:37 PM

## 2020-06-09 NOTE — Progress Notes (Signed)
PROGRESS NOTE  [name] MBW:[id number] DOB: 10/12/1945   PCP: [name], MD  Patient is from: Home  DOA: 06/03/2020 LOS: 6  Chief complaints: Fever  Brief Narrative / Interim history: 74 year old female with history of CAD s/p stents, severe AS s/p AVR, OSA/COPD/chronic RF with as needed O2, diastolic CHF, A. Fib s/p maze on warfarin, DM-2, ESRD s/p transplant now with CKD-4, adenocarcinoma of lung, embolic CVA with residual aphasia and BLE nonhealing wounds s/p abdominal aortogram on 11/12 found to have fever, tachypnea, tachycardia with difficulty clearing secretions after she called EMS for epistaxis.  Patient was admitted for sepsis due to LLE purulent cellulitis and Proteus UTI, and A. fib with RVR, acute on chronic diastolic CHF and acute on chronic hypoxemic RF.  Empirically treated with vancomycin and meropenem which were narrowed to Zyvox.  Also treated with IV Lasix for CHF exacerbation.  Cardiology and palliative medicine following.  PMT planning to meet with family today to discuss goals of care.  Subjective: Seen and examined earlier this morning.  No major events overnight of this morning.  No complaints but not a reliable historian.  She is awake and alert.  Oriented to self place and month today.  More interactive.  Poor insight into his situation.  She thinks she is in the hospital due to stroke.  Objective: Vitals:   06/08/20 1414 06/08/20 2010 06/09/20 0406 06/09/20 0442  BP: (!) 154/69 (!) 152/76 130/64   Pulse: 67 77 74   Resp: 16 17 16    Temp: (!) 97.5 F (36.4 C) 99.2 F (37.3 C) 99.2 F (37.3 C)   TempSrc:  Oral Oral   SpO2: (!) 88% 98% 100%   Weight:    83.6 kg  Height:        Intake/Output Summary (Last 24 hours) at 06/09/2020 1353 Last data filed at 06/08/2020 2200 Gross per 24 hour  Intake 1257.37 ml  Output --  Net 1257.37 ml   Filed Weights   06/07/20 0500 06/08/20 0532 06/09/20 0442  Weight: 80.8 kg 80.8 kg 83.6 kg     Examination:  GENERAL: Chronically ill-appearing.  Nontoxic. HEENT: MMM.  Vision and hearing grossly intact.  NECK: Supple.  No apparent JVD.  RESP: On RA.  No IWOB.  Fair aeration bilaterally. CVS:  RRR. Heart sounds normal.  ABD/GI/GU: BS+. Abd soft, NTND.  MSK/EXT:  Moves extremities.  Edema, erythema, bulla and ulceration in her legs as below SKIN: Large bulla over the dorsum of left foot.  2 large ulceration over LLE.  Small clear looking ulcers on RLE.  Edema with increased warmth to touch in BLE.  See pictures below for more. NEURO: Awake and alert.  Oriented to self, place and month.  Some expressive aphasia. PSYCH: Calm. Normal affect.        Procedures:  None  Microbiology summarized: [id number], influenza and RSV PCR nonreactive. Urine culture with 10,000 colonies of Proteus mirabilis resistant to Macrobid and imipenem Blood cultures NGTD.  Assessment & Plan: Sepsis due to LLE purulent cellulitis/infected nonhealing wounds due to PAD and possible Proteus UTI: POA.  Blood cultures NGTD.  Urine culture with 10,000 colonies of Proteus mirabilis.  TTE did not reveal large vegetation.  Patient had an abdominal aortogram on 11/12 that showed heavily calcified bilateral proximal arteries with flush occlusion of bilateral superficial femoral artery.  CT LLE revealed open ulceration with cellulitis and mayofascitis -Per VVS, amputation would be the next consideration if pain is unbearable -Appreciate PMT with  help help on pain control and goal of care -Vancomycin and meropenem>> Zyvox -Appreciate help by wound care. -Discontinue amlodipine-could contribute to her edema and delayed wound healing  Acute on chronic hypoxemic respiratory failure: Likely due to CHF exacerbation. Acute on chronic diastolic CHF: TTE with EF of 55 to 60%, RWMA, RVSP of 73.4, severe LAE, moderate RAE and moderate to severe MVR, severe mitral annular calcification and severe TVR.  On p.o. torsemide.   I&O incomplete.  Creatinine and BUN slightly up today. -Cardiology managing-on Demadex 100 mg daily-may need to back off on diuretics. -Monitor fluid status and renal functions  Moderate to severe mitral valve regurgitation Severe tricuspid valve regurgitation -Cardiology on board. Not a candidate for surgical intervention. -Continue diuretics as above.    History of severe AS s/p AVR with prosthetic valve: Seems stable on recent TTE. -Appreciate help by cardiology  A. fib with RVR: Rate controlled.  On metoprolol and warfarin at home. -Continue metoprolol and heparin drip  History of CAD s/p stents -Continue metoprolol, statin  History of embolic CVA with residual expressive aphasia: CT head ->prior infarct at the junction of the left temporal parietal and occipital lobes, no acute infarct.  -Appreciate help by SLP  Abdominal pain: CT abdomen and pelvis without contrast revealed new body wall edema.  Her recent arthrogram but doesn't look like ischemic bowel.  Seems to have resolved.  History of ESRD status post renal transplant now with CKD-4: On low-dose prednisone and tacrolimus -Continue tacrolimus and prednisone per nephrology recommendation -Liberated diet.  Uncontrolled DM-2 with hyperglycemia: A1c 9.2%.  Recent Labs  Lab 06/08/20 1205 06/08/20 1715 06/08/20 2014 06/09/20 0732 06/09/20 1205  GLUCAP 160* 200* 188* 276* 202*  -Discontinued D10. -Continue SSI-thin. Add nightly coverage. -Add NovoLog AC 2 units  Essential hypertension: Normotensive this morning. -Continue p.o. metoprolol and diuretics as above.  Hyperlipidemia -Continue statin  Left arm swelling: LUE Doppler negative for DVT.  Goal of care: Patient with multiple comorbidities as above.  Very grim prognosis.  She is still full code which I believe would pose more harm than benefit.  Palliative medicine following. -Appreciate help by palliative medicine-plan for family meeting today  Inadequate  oral intake Body mass index is 34.82 kg/m. Nutrition Problem: Inadequate oral intake Etiology: decreased appetite Signs/Symptoms: per patient/family report Interventions: Ensure Enlive (each supplement provides 350kcal and 20 grams of protein), Magic cup, MVI, Liberalize Diet   DVT prophylaxis:  On heparin drip for A. fib  Code Status: Full code Family Communication: Patient and/or RN. Available if any question.  Status is: Inpatient  Remains inpatient appropriate because:Ongoing active pain requiring inpatient pain management, Unsafe d/c plan, IV treatments appropriate due to intensity of illness or inability to take PO and Inpatient level of care appropriate due to severity of illness   Dispo: The patient is from: Home              Anticipated d/c is to: SNF unless family likes to pursue comfort measures              Anticipated d/c date is: > 3 days              Patient currently is not medically stable to d/c.       Consultants:  Cardiology Vascular surgery Nephrology over the phone Palliative medicine   Sch Meds:  Scheduled Meds: . acetaminophen  650 mg Oral Q6H  . collagenase   Topical Daily  . feeding supplement  237 mL  Oral TID BM  . fentaNYL  1 patch Transdermal Q72H  . insulin aspart  0-5 Units Subcutaneous QHS  . insulin aspart  0-6 Units Subcutaneous TID WC  . insulin aspart  2 Units Subcutaneous TID WC  . linezolid  600 mg Oral Q12H  . living well with diabetes book   Does not apply Once  . metoprolol tartrate  25 mg Oral BID  . predniSONE  5 mg Oral QAC breakfast  . sodium chloride flush  3 mL Intravenous Q12H  . tacrolimus  3 mg Oral BID  . torsemide  100 mg Oral Daily   Continuous Infusions: . heparin 1,650 Units/hr (06/09/20 0208)   PRN Meds:.HYDROmorphone (DILAUDID) injection, oxyCODONE, polyethylene glycol  Antimicrobials: Anti-infectives (From admission, onward)   Start     Dose/Rate Route Frequency Ordered Stop   06/06/20 2200   linezolid (ZYVOX) tablet 600 mg        600 mg Oral Every 12 hours 06/06/20 1136     06/05/20 2200  linezolid (ZYVOX) IVPB 600 mg  Status:  Discontinued        600 mg 300 mL/hr over 60 Minutes Intravenous Every 12 hours 06/05/20 1814 06/06/20 1136   06/05/20 1400  ceFAZolin (ANCEF) IVPB 1 g/50 mL premix  Status:  Discontinued        1 g 100 mL/hr over 30 Minutes Intravenous Every 8 hours 06/05/20 0931 06/05/20 1006   06/05/20 1100  ceFAZolin (ANCEF) IVPB 1 g/50 mL premix  Status:  Discontinued        1 g 100 mL/hr over 30 Minutes Intravenous Every 12 hours 06/05/20 1006 06/05/20 1811   06/04/20 2200  cefTRIAXone (ROCEPHIN) 2 g in sodium chloride 0.9 % 100 mL IVPB  Status:  Discontinued        2 g 200 mL/hr over 30 Minutes Intravenous Every 24 hours 06/04/20 1315 06/05/20 0931   06/04/20 1415  linezolid (ZYVOX) IVPB 600 mg  Status:  Discontinued        600 mg 300 mL/hr over 60 Minutes Intravenous Every 12 hours 06/04/20 1315 06/05/20 0931   06/04/20 1415  metroNIDAZOLE (FLAGYL) tablet 500 mg  Status:  Discontinued        500 mg Oral Every 8 hours 06/04/20 1315 06/05/20 0931   06/03/20 1800  meropenem (MERREM) 500 mg in sodium chloride 0.9 % 100 mL IVPB  Status:  Discontinued        500 mg 200 mL/hr over 30 Minutes Intravenous Every 12 hours 06/03/20 0907 06/04/20 1315   06/03/20 0909  vancomycin variable dose per unstable renal function (pharmacist dosing)  Status:  Discontinued         Does not apply See admin instructions 06/03/20 0909 06/04/20 1315   06/03/20 0430  ceFEPIme (MAXIPIME) 2 g in sodium chloride 0.9 % 100 mL IVPB        2 g 200 mL/hr over 30 Minutes Intravenous  Once 06/03/20 0423 06/03/20 0617   06/03/20 0430  piperacillin-tazobactam (ZOSYN) IVPB 3.375 g        3.375 g 12.5 mL/hr over 240 Minutes Intravenous  Once 06/03/20 0423 06/03/20 0909   06/03/20 0430  vancomycin (VANCOCIN) IVPB 1000 mg/200 mL premix        1,000 mg 200 mL/hr over 60 Minutes Intravenous  Once  06/03/20 0423 06/03/20 0617       I have personally reviewed the following labs and images: CBC: Recent Labs  Lab 06/03/20 0407 06/03/20 0407 06/04/20 0423  06/05/20 0034 06/06/20 0440 06/07/20 0450 06/09/20 0418  WBC 14.2*   < > 16.0* 14.5* 13.4* 13.3* 12.1*  NEUTROABS 12.3*  --   --   --   --   --  10.2*  HGB 12.5   < > 12.0 12.4 11.6* 11.1* 11.1*  HCT 41.7   < > 39.5 42.2 39.2 37.5 36.7  MCV 80.0   < > 79.6* 81.3 80.8 79.6* 77.9*  PLT 199   < > 168 168 186 193 219   < > = values in this interval not displayed.   BMP &GFR Recent Labs  Lab 06/05/20 0034 06/06/20 0440 06/07/20 0450 06/08/20 0350 06/09/20 0418  NA 136 139 141 141 137  K 5.9* 3.2* 2.9* 3.5 4.0  CL 95* 95* 96* 97* 95*  CO2 28 32 33* 29 31  GLUCOSE 123* 128* 81 108* 276*  BUN 46* 44* 41* 39* 43*  CREATININE 2.65* 2.64* 2.48* 2.48* 2.67*  CALCIUM 9.1 9.4 9.4 9.5 9.6  MG  --   --   --  2.1 2.0  PHOS  --   --   --   --  3.7   Estimated Creatinine Clearance: 17.6 mL/min (A) (by C-G formula based on SCr of 2.67 mg/dL (H)). Liver & Pancreas: Recent Labs  Lab 06/03/20 0407 06/08/20 0350 06/09/20 0418  AST 23 54*  --   ALT 15 18  --   ALKPHOS 136* 672*  --   BILITOT 1.1 1.0  --   PROT 7.2 6.4*  --   ALBUMIN 2.8* 2.4* 2.3*   No results for input(s): LIPASE, AMYLASE in the last 168 hours. No results for input(s): AMMONIA in the last 168 hours. Diabetic: No results for input(s): HGBA1C in the last 72 hours. Recent Labs  Lab 06/08/20 1205 06/08/20 1715 06/08/20 2014 06/09/20 0732 06/09/20 1205  GLUCAP 160* 200* 188* 276* 202*   Cardiac Enzymes: Recent Labs  Lab 06/05/20 1159  CKTOTAL 98   No results for input(s): PROBNP in the last 8760 hours. Coagulation Profile: Recent Labs  Lab 06/03/20 0407 06/04/20 0423  INR 1.3* 1.4*   Thyroid Function Tests: No results for input(s): TSH, T4TOTAL, FREET4, T3FREE, THYROIDAB in the last 72 hours. Lipid Profile: No results for input(s): CHOL,  HDL, LDLCALC, TRIG, CHOLHDL, LDLDIRECT in the last 72 hours. Anemia Panel: No results for input(s): VITAMINB12, FOLATE, FERRITIN, TIBC, IRON, RETICCTPCT in the last 72 hours. Urine analysis:    Component Value Date/Time   COLORURINE YELLOW 06/03/2020 0429   APPEARANCEUR CLEAR 06/03/2020 0429   LABSPEC 1.012 06/03/2020 0429   PHURINE 7.0 06/03/2020 0429   GLUCOSEU 150 (A) 06/03/2020 0429   GLUCOSEU 100 (A) 07/02/2016 1440   HGBUR NEGATIVE 06/03/2020 0429   BILIRUBINUR NEGATIVE 06/03/2020 0429   KETONESUR NEGATIVE 06/03/2020 0429   PROTEINUR >=300 (A) 06/03/2020 0429   UROBILINOGEN 0.2 07/02/2016 1440   NITRITE NEGATIVE 06/03/2020 0429   LEUKOCYTESUR SMALL (A) 06/03/2020 0429   Sepsis Labs: Invalid input(s): PROCALCITONIN, [place]  Microbiology: Recent Results (from the past 240 hour(s))  Culture, blood (Routine x 2)     Status: None   Collection Time: 06/03/20  4:07 AM   Specimen: BLOOD  Result Value Ref Range Status   Specimen Description   Final    BLOOD RIGHT ANTECUBITAL Performed at [facility] [address], [place],  [address]    Special Requests   Final    BOTTLES DRAWN AEROBIC AND ANAEROBIC Blood Culture  adequate volume Performed at [facility] [address], [place], [place] [address]    Culture   Final    NO GROWTH 5 DAYS Performed at [facility], [place] [address], [place], [place] [address]    Report Status 06/08/2020 FINAL  Final  Culture, blood (Routine x 2)     Status: None   Collection Time: 06/03/20  4:07 AM   Specimen: BLOOD RIGHT HAND  Result Value Ref Range Status   Specimen Description   Final    BLOOD RIGHT HAND Performed at [facility] [address] [place], [place] [address]    Special Requests   Final    BOTTLES DRAWN AEROBIC AND ANAEROBIC Blood Culture results may not be optimal due to an inadequate volume of blood received in culture bottles Performed at  [facility] [address], [place], [place] [address]    Culture   Final    NO GROWTH 5 DAYS Performed at [facility], [place] [address] [place], [place] [address]    Report Status 06/08/2020 FINAL  Final  Urine culture     Status: Abnormal   Collection Time: 06/03/20  4:29 AM   Specimen: In/Out Cath Urine  Result Value Ref Range Status   Specimen Description   Final    IN/OUT CATH URINE Performed at [facility] [address] [place],  [address]    Special Requests   Final    NONE Performed at [facility], [place] [address], [place], [place] [address]    Culture 10,000 COLONIES/mL PROTEUS MIRABILIS (A)  Final   Report Status 06/05/2020 FINAL  Final   Organism ID, Bacteria PROTEUS MIRABILIS (A)  Final      Susceptibility   Proteus mirabilis - MIC*    AMPICILLIN <=2 SENSITIVE Sensitive     CEFAZOLIN <=4 SENSITIVE Sensitive     CEFEPIME <=0.12 SENSITIVE Sensitive     CEFTRIAXONE <=0.25 SENSITIVE Sensitive     CIPROFLOXACIN <=0.25 SENSITIVE Sensitive     GENTAMICIN <=1 SENSITIVE Sensitive     IMIPENEM 8 INTERMEDIATE Intermediate     NITROFURANTOIN 128 RESISTANT Resistant     TRIMETH/SULFA <=20 SENSITIVE Sensitive     AMPICILLIN/SULBACTAM <=2 SENSITIVE Sensitive     PIP/TAZO <=4 SENSITIVE Sensitive     * 10,000 COLONIES/mL PROTEUS MIRABILIS  Resp Panel by RT PCR (RSV, Flu A&B, Covid) - Nasopharyngeal Swab     Status: None   Collection Time: 06/03/20  8:14 AM   Specimen: Nasopharyngeal Swab  Result Value Ref Range Status   SARS Coronavirus 2 by RT PCR NEGATIVE NEGATIVE Final    Comment: (NOTE) SARS-CoV-2 target nucleic acids are NOT DETECTED.  The SARS-CoV-2 RNA is generally detectable in upper respiratoy specimens during the acute phase of infection. The lowest concentration of SARS-CoV-2 viral copies this assay can detect is 131 copies/mL. A negative result does not preclude SARS-Cov-2 infection and  should not be used as the sole basis for treatment or other patient management decisions. A negative result may occur with  improper specimen collection/handling, submission of specimen other than nasopharyngeal swab, presence of viral mutation(s) within the areas targeted by this assay, and inadequate number of viral copies (<131 copies/mL). A negative result must be combined with clinical observations, patient history, and epidemiological information. The expected result is Negative.  Fact Sheet for Patients:  PinkCheek.be  Fact Sheet for Healthcare Providers:  GravelBags.it  This test  is no t yet approved or cleared by the [place] and  has been authorized for detection and/or diagnosis of SARS-CoV-2 by FDA under an Emergency Use Authorization (EUA). This EUA will remain  in effect (meaning this test can be used) for the duration of the COVID-19 declaration under Section 564(b)(1) of the Act, 21 U.S.C. section 360bbb-3(b)(1), unless the authorization is terminated or revoked sooner.     Influenza A by PCR NEGATIVE NEGATIVE Final   Influenza B by PCR NEGATIVE NEGATIVE Final    Comment: (NOTE) The Xpert Xpress SARS-CoV-2/FLU/RSV assay is intended as an aid in  the diagnosis of influenza from Nasopharyngeal swab specimens and  should not be used as a sole basis for treatment. Nasal washings and  aspirates are unacceptable for Xpert Xpress SARS-CoV-2/FLU/RSV  testing.  Fact Sheet for Patients: PinkCheek.be  Fact Sheet for Healthcare Providers: GravelBags.it  This test is not yet approved or cleared by the [place] FDA and  has been authorized for detection and/or diagnosis of SARS-CoV-2 by  FDA under an Emergency Use Authorization (EUA). This EUA will remain  in effect (meaning this test can be used) for the duration of the  Covid-19 declaration  under Section 564(b)(1) of the Act, 21  U.S.C. section 360bbb-3(b)(1), unless the authorization is  terminated or revoked.    Respiratory Syncytial Virus by PCR NEGATIVE NEGATIVE Final    Comment: (NOTE) Fact Sheet for Patients: PinkCheek.be  Fact Sheet for Healthcare Providers: GravelBags.it  This test is not yet approved or cleared by the [place] FDA and  has been authorized for detection and/or diagnosis of SARS-CoV-2 by  FDA under an Emergency Use Authorization (EUA). This EUA will remain  in effect (meaning this test can be used) for the duration of the  COVID-19 declaration under Section 564(b)(1) of the Act, 21 U.S.C.  section 360bbb-3(b)(1), unless the authorization is terminated or  revoked. Performed at [facility], [place] [address] [place], [place] [address]   MRSA PCR Screening     Status: Abnormal   Collection Time: 06/03/20  2:00 PM   Specimen: Nasal Mucosa; Nasopharyngeal  Result Value Ref Range Status   MRSA by PCR POSITIVE (A) NEGATIVE Final    Comment:        The GeneXpert MRSA Assay (FDA approved for NASAL specimens only), is one component of a comprehensive MRSA colonization surveillance program. It is not intended to diagnose MRSA infection nor to guide or monitor treatment for MRSA infections. RESULT CALLED TO, READ BACK BY AND VERIFIED WITH: HEAVNER,L. RN @1848  06/03/20 BILLINGSLEY,L Performed at [name], [place] [address] [place], [place] [address]     Radiology Studies: No results found.    Arleny Kruger T. [place]  If 7PM-7AM, please contact night-coverage [email or web address] 06/09/2020, 1:53 PM

## 2020-06-09 NOTE — Progress Notes (Signed)
[facility] for heparin Indication: hx bioprosthetic AVR, stroke, afib  Allergies  Allergen Reactions  . Ibuprofen Nausea And Vomiting  . Sulfamethoxazole-Trimethoprim Itching, Swelling and Rash    Swelling of the face  . Sulfonamide Derivatives Itching, Swelling and Rash    Swelling of the face  . Tape Rash    Paper tape is ok  . Tramadol Nausea And Vomiting  . Doxycycline Nausea Only  . Hydrocil [Psyllium] Nausea And Vomiting  . Bactrim Itching, Swelling and Rash  . Red Dye Itching and Rash   Patient Measurements: Height: 5\' 1"  (154.9 cm) Weight: 83.6 kg (184 lb 4.9 oz) IBW/kg (Calculated) : 47.8 Heparin Dosing Weight: 82 kg  Vital Signs: Temp: 99.2 F (37.3 C) (11/22 0406) Temp Source: Oral (11/22 0406) BP: 130/64 (11/22 0406) Pulse Rate: 74 (11/22 0406)  Labs: Recent Labs    06/07/20 0450 06/07/20 0450 06/07/20 1604 06/08/20 0350 06/08/20 0404 06/09/20 0418  HGB 11.1*  --   --   --   --  11.1*  HCT 37.5  --   --   --   --  36.7  PLT 193  --   --   --   --  219  HEPARINUNFRC 0.20*   < > 0.43  --  0.37 0.41  CREATININE 2.48*  --   --  2.48*  --  2.67*   < > = values in this interval not displayed.    Estimated Creatinine Clearance: 17.6 mL/min (A) (by C-G formula based on SCr of 2.67 mg/dL (H)).   Medications:  - on warfarin PTA  Assessment: Patient is a 74 y.o F with hx bioprosthetic AVR, stroke, and afib on warfarin PTA presented to the ED on 11/16 with fever. Pharmacy is consulted to transition to heparin drip on admission.  Today, 06/09/2020:  HL 0.41 therapeutic on 1650 units/hr  Hgb 11.1, plts WNL  SCr still elevated  No bleeding noted   Goal of Therapy:  Heparin level 0.3-0.7 units/ml Monitor platelets by anticoagulation protocol: Yes   Plan:   Continue Heparin infusion at 1650 units/hr  Daily heparin level & CBC  Monitor for s/s bleeding  Dolly Rias RPh 06/09/2020, 4:54 AM

## 2020-06-09 NOTE — Progress Notes (Signed)
Inpatient Diabetes Program Recommendations  AACE/ADA: New Consensus Statement on Inpatient Glycemic Control (2015)  Target Ranges:  Prepandial:   less than 140 mg/dL      Peak postprandial:   less than 180 mg/dL (1-2 hours)      Critically ill patients:  140 - 180 mg/dL   Lab Results  Component Value Date   GLUCAP 276 (H) 06/09/2020   HGBA1C 9.2 (H) 06/03/2020    Review of Glycemic Control Results for NICOYA, FRIEL (MRN [id number]) as of 06/09/2020 12:04  Ref. Range 06/08/2020 07:17 06/08/2020 12:05 06/08/2020 17:15 06/08/2020 20:14 06/09/2020 07:32  Glucose-Capillary Latest Ref Range: 70 - 99 mg/dL 116 (H) 160 (H) 200 (H) 188 (H) 276 (H)   Diabetes history:  T2DM Outpatient Diabetes medications:  Toujeo 60 units daily Humalog 10 units tid Current orders for Inpatient glycemic control:  Novolog 0-9 units tid Prednisone 5 units daily  Inpatient Diabetes Program Recommendations:     Might consider, Lantus 8 units daily (0.1 unit/kg)  Will continue to follow while inpatient.  Thank you, Reche Dixon, RN, BSN Diabetes Coordinator Inpatient Diabetes Program [phone] (team pager from 8a-5p)

## 2020-06-10 DIAGNOSIS — R531 Weakness: Secondary | ICD-10-CM

## 2020-06-10 DIAGNOSIS — Z515 Encounter for palliative care: Secondary | ICD-10-CM

## 2020-06-10 DIAGNOSIS — I5033 Acute on chronic diastolic (congestive) heart failure: Secondary | ICD-10-CM | POA: Diagnosis not present

## 2020-06-10 DIAGNOSIS — A419 Sepsis, unspecified organism: Secondary | ICD-10-CM | POA: Diagnosis not present

## 2020-06-10 DIAGNOSIS — Z7189 Other specified counseling: Secondary | ICD-10-CM

## 2020-06-10 DIAGNOSIS — N186 End stage renal disease: Secondary | ICD-10-CM | POA: Diagnosis not present

## 2020-06-10 DIAGNOSIS — I4891 Unspecified atrial fibrillation: Secondary | ICD-10-CM | POA: Diagnosis not present

## 2020-06-10 LAB — CBC WITH DIFFERENTIAL/PLATELET
Abs Immature Granulocytes: 0.07 10*3/uL (ref 0.00–0.07)
Basophils Absolute: 0 10*3/uL (ref 0.0–0.1)
Basophils Relative: 0 %
Eosinophils Absolute: 0.1 10*3/uL (ref 0.0–0.5)
Eosinophils Relative: 1 %
HCT: 37.7 % (ref 36.0–46.0)
Hemoglobin: 11.3 g/dL — ABNORMAL LOW (ref 12.0–15.0)
Immature Granulocytes: 1 %
Lymphocytes Relative: 6 %
Lymphs Abs: 0.6 10*3/uL — ABNORMAL LOW (ref 0.7–4.0)
MCH: 23.4 pg — ABNORMAL LOW (ref 26.0–34.0)
MCHC: 30 g/dL (ref 30.0–36.0)
MCV: 78.2 fL — ABNORMAL LOW (ref 80.0–100.0)
Monocytes Absolute: 0.8 10*3/uL (ref 0.1–1.0)
Monocytes Relative: 9 %
Neutro Abs: 8.1 10*3/uL — ABNORMAL HIGH (ref 1.7–7.7)
Neutrophils Relative %: 83 %
Platelets: 220 10*3/uL (ref 150–400)
RBC: 4.82 MIL/uL (ref 3.87–5.11)
RDW: 20.2 % — ABNORMAL HIGH (ref 11.5–15.5)
WBC: 9.7 10*3/uL (ref 4.0–10.5)
nRBC: 0 % (ref 0.0–0.2)

## 2020-06-10 LAB — GLUCOSE, CAPILLARY
Glucose-Capillary: 161 mg/dL — ABNORMAL HIGH (ref 70–99)
Glucose-Capillary: 130 mg/dL — ABNORMAL HIGH (ref 70–99)
Glucose-Capillary: 149 mg/dL — ABNORMAL HIGH (ref 70–99)
Glucose-Capillary: 104 mg/dL — ABNORMAL HIGH (ref 70–99)

## 2020-06-10 LAB — RENAL FUNCTION PANEL
Albumin: 2.4 g/dL — ABNORMAL LOW (ref 3.5–5.0)
Anion gap: 13 (ref 5–15)
BUN: 46 mg/dL — ABNORMAL HIGH (ref 8–23)
CO2: 30 mmol/L (ref 22–32)
Calcium: 9.9 mg/dL (ref 8.9–10.3)
Chloride: 96 mmol/L — ABNORMAL LOW (ref 98–111)
Creatinine, Ser: 2.66 mg/dL — ABNORMAL HIGH (ref 0.44–1.00)
GFR, Estimated: 18 mL/min — ABNORMAL LOW (ref >60)
Glucose, Bld: 168 mg/dL — ABNORMAL HIGH (ref 70–99)
Phosphorus: 2.8 mg/dL (ref 2.5–4.6)
Potassium: 3.8 mmol/L (ref 3.5–5.1)
Sodium: 139 mmol/L (ref 135–145)

## 2020-06-10 LAB — HEPARIN LEVEL (UNFRACTIONATED)
Heparin Unfractionated: <0.1 IU/mL — ABNORMAL LOW (ref 0.30–0.70)
Heparin Unfractionated: 0.2 IU/mL — ABNORMAL LOW (ref 0.30–0.70)
Heparin Unfractionated: 0.48 IU/mL (ref 0.30–0.70)

## 2020-06-10 LAB — MAGNESIUM: Magnesium: 1.9 mg/dL (ref 1.7–2.4)

## 2020-06-10 MED ORDER — LEVOTHYROXINE SODIUM 100 MCG/5ML IV SOLN
25.0000 ug | Freq: Every day | INTRAVENOUS | Status: DC
  Administered 2020-06-11 – 2020-06-13 (×3): 25 ug via INTRAVENOUS
  Filled 2020-06-10 (×4): qty 5

## 2020-06-10 MED ORDER — HEPARIN (PORCINE) 25000 UT/250ML-% IV SOLN
1650.0000 [IU]/h | INTRAVENOUS | Status: DC
  Administered 2020-06-11 – 2020-06-13 (×4): 1800 [IU]/h via INTRAVENOUS
  Filled 2020-06-10 (×4): qty 250

## 2020-06-10 MED ORDER — METOPROLOL TARTRATE 5 MG/5ML IV SOLN
2.5000 mg | Freq: Four times a day (QID) | INTRAVENOUS | Status: DC
  Administered 2020-06-10 – 2020-06-13 (×12): 2.5 mg via INTRAVENOUS
  Filled 2020-06-10 (×12): qty 5

## 2020-06-10 MED ORDER — LINEZOLID 600 MG/300ML IV SOLN: 600.0000 mg | Freq: Two times a day (BID) | INTRAVENOUS | Status: DC

## 2020-06-10 MED ORDER — LINEZOLID 600 MG/300ML IV SOLN
600.0000 mg | Freq: Two times a day (BID) | INTRAVENOUS | Status: DC
  Administered 2020-06-10 – 2020-06-13 (×6): 600 mg via INTRAVENOUS
  Filled 2020-06-10 (×7): qty 300

## 2020-06-10 NOTE — Progress Notes (Signed)
Daily Progress Note   Patient Name: [name]       Date: 06/10/2020 DOB: [birth year]  Age: 74 y.o. MRN#: [id number] Attending Physician: Mercy Riding, MD Primary Care Physician: [name], MD Admit Date: 06/03/2020  Reason for Consultation/Follow-up: Establishing goals of care and Pain control  Subjective:  I saw and examined Ms. Bashor today.  She was lying in bed in no distress at time of my encounter. She is noted to be more alert today.   Chart reviewed, discussed with floor nursing staff as well.   Family arrived and goals of care meeting took place, see below.     Length of Stay: 7  Current Medications: Scheduled Meds:   acetaminophen  650 mg Oral Q6H   collagenase   Topical Daily   feeding supplement  237 mL Oral TID BM   fentaNYL  1 patch Transdermal Q72H   insulin aspart  0-5 Units Subcutaneous QHS   insulin aspart  0-6 Units Subcutaneous TID WC   insulin aspart  2 Units Subcutaneous TID WC   linezolid  600 mg Oral Q12H   metoprolol tartrate  2.5 mg Intravenous Q6H   predniSONE  5 mg Oral QAC breakfast   sodium chloride flush  3 mL Intravenous Q12H   tacrolimus  3 mg Oral BID   torsemide  100 mg Oral Daily    Continuous Infusions:  heparin      PRN Meds: HYDROmorphone (DILAUDID) injection, oxyCODONE, polyethylene glycol  Physical Exam    General:  Acknowledges me in room but does not engage in conversation.    HEENT: No bruits, no goiter, no JVD Heart: Regular rate and rhythm. No murmur appreciated. Lungs: Good air movement, clear Abdomen: Soft, nontender, nondistended, positive bowel sounds.  Skin: Warm and dry Neuro: reported to be more alert today, tracks me in the room, interacts meaningfully with her family, does not  verbalize.      Ext: Left arm notably swollen and warm and hand versus right arm. LLE in dressing, chart reviewed.   Vital Signs: BP 140/71 (BP Location: Right Arm)    Pulse 80    Temp 98.9 F (37.2 C)    Resp 17    Ht 5' 1"  (1.549 m)    Wt 81.8 kg    SpO2 97%  BMI 34.07 kg/m  SpO2: SpO2: 97 % O2 Device: O2 Device: Nasal Cannula O2 Flow Rate: O2 Flow Rate (L/min): 2 L/min  Intake/output summary:   Intake/Output Summary (Last 24 hours) at 06/10/2020 1444 Last data filed at 06/10/2020 0800 Gross per 24 hour  Intake 250 ml  Output 0 ml  Net 250 ml   LBM: Last BM Date:  (PTA) Baseline Weight: Weight: 82 kg Most recent weight: Weight: 81.8 kg       Palliative Assessment/Data:    Flowsheet Rows     Most Recent Value  Intake Tab  Referral Department Hospitalist  Unit at Time of Referral Med/Surg Unit  Clinical Assessment  Psychosocial & Spiritual Assessment  Palliative Care Outcomes      Patient Active Problem List   Diagnosis Date Noted   Pulmonary hypertension, unspecified ([place])    Atrial fibrillation with rapid ventricular response ([place]) 06/03/2020   Sepsis ([place]) 06/03/2020   ESRD (end stage renal disease) ([place])    Venous stasis ulcers of both lower extremities ([place]) 01/31/2020   Venous stasis dermatitis of both lower extremities 01/31/2020   Claudication of both lower extremities ([place]) 01/31/2020   Chronic venous insufficiency 12/31/2019   Heart failure ([place]) 10/02/2019   Dyspnea 08/20/2019   COPD (chronic obstructive pulmonary disease) ([place]) 08/20/2019   Hematoma of arm, left, subsequent encounter 07/23/2019   Obesity (BMI 30-39.9) 12/20/2018   Acute respiratory failure with hypoxia ([place]) 12/19/2018   Gait disorder 11/22/2018   History of seizure disorder 09/12/2018   History of stroke 09/12/2018   Edema 08/28/2018   Hypertension secondary to other renal disorders 03/22/2018   Proteinuria 03/22/2018   Malignant neoplasm of lung ([place])  03/22/2018   Breast pain in female 02/04/2018   Lung cancer ([place]) 11/30/2017   Adenocarcinoma of lung, stage 1, right ([place]) 11/25/2017   Chronic respiratory failure with hypoxia ([place]) 12/27/2016   Acute on chronic diastolic CHF (congestive heart failure) ([place]) 12/27/2016   Left upper extremity swelling 12/27/2016   Pain of left breast 12/27/2016   Arm muscle atrophy 11/16/2016   Insomnia 02/25/2016   Renal cyst 11/21/2015   Renal mass, right 11/10/2015   Shoulder pain, right 08/13/2015   Weight gain 08/12/2014   Multinodular goiter 05/01/2014   Encounter for therapeutic drug monitoring 08/14/2013   S/P aortic valve replacement with bioprosthetic valve and maze procedure 04/12/2013   Nodule of right lung 04/07/2013   Nodule of left lung 04/07/2013   Pain in limb 05/22/2012   Dermatitis 01/26/2012   Long term (current) use of anticoagulants 08/17/2011   Anemia in chronic renal disease 05/13/2011   Hypokalemia 04/26/2011   Immunosuppression ([place]) 04/26/2011   Hypertension associated with diabetes ([place]) 04/26/2011   Renal transplant recipient 03/16/2011   Low back pain 02/23/2011   Hypersalivation 11/24/2010   AORTIC STENOSIS 08/20/2010   DISTURBANCE OF SALIVARY SECRETION 07/28/2010   NAUSEA 04/14/2010   SMOKER 10/07/2009   ALOPECIA 10/07/2009   CAROTID STENOSIS 03/04/2009   AF (paroxysmal atrial fibrillation) ([place]) 12/31/2008   NEOPLASM, MALIGNANT, KIDNEY 07/02/2008   APHASIA DUE TO CEREBROVASCULAR DISEASE 07/02/2008   Chronic fatigue 05/21/2008   Hyperlipidemia LDL goal <70 09/27/2007   Constipation 09/27/2007   SLEEP APNEA 09/27/2007   CHOLELITHIASIS 50/93/2671   HELICOBACTER PYLORI INFECTION, HX OF 06/08/2007   Type 2 diabetes mellitus with chronic kidney disease, with long-term current use of insulin ([place]) 05/23/2007   Gout 05/23/2007   Hypertension due to kidney transplant 05/23/2007   MYOCARDIAL INFARCTION,  HX OF  05/23/2007   Coronary atherosclerosis 05/23/2007   GERD 05/23/2007   COLONIC POLYPS, HX OF 05/23/2007   DIVERTICULOSIS, COLON 07/01/2005    Palliative Care Assessment & Plan   Patient Profile: 74 y.o. female  with past medical history of CAD status post stent, severe aortic stenosis status post AVR, hypertension, hyperlipidemia, OSA, COPD with intermittent O2 requirement, HFpEF, atrial fibrillation status post maze on Coumadin, type 2 diabetes, end-stage renal disease status post transplant, CKD 4, adenocarcinoma of the lung, embolic CVA with residual aphasia, bilateral lower extremity nonhealing wounds and recent abdominal aortogram with out actionable findings (would likely need eventual AKA) admitted on 06/03/2020 with sepsis, concern for endocarditis, A. fib and worsening renal failure.  Palliative consulted for goals of care.  Assessment: Patient Active Problem List   Diagnosis Date Noted   Pulmonary hypertension, unspecified ([place])    Atrial fibrillation with rapid ventricular response ([place]) 06/03/2020   Sepsis ([place]) 06/03/2020   ESRD (end stage renal disease) ([place])    Venous stasis ulcers of both lower extremities ([place]) 01/31/2020   Venous stasis dermatitis of both lower extremities 01/31/2020   Claudication of both lower extremities ([place]) 01/31/2020   Chronic venous insufficiency 12/31/2019   Heart failure ([place]) 10/02/2019   Dyspnea 08/20/2019   COPD (chronic obstructive pulmonary disease) ([place]) 08/20/2019   Hematoma of arm, left, subsequent encounter 07/23/2019   Obesity (BMI 30-39.9) 12/20/2018   Acute respiratory failure with hypoxia ([facility]) 12/19/2018   Gait disorder 11/22/2018   History of seizure disorder 09/12/2018   History of stroke 09/12/2018   Edema 08/28/2018   Hypertension secondary to other renal disorders 03/22/2018   Proteinuria 03/22/2018   Malignant neoplasm of lung ([place]) 03/22/2018   Breast pain in female 02/04/2018   Lung cancer  ([place]) 11/30/2017   Adenocarcinoma of lung, stage 1, right ([place]) 11/25/2017   Chronic respiratory failure with hypoxia ([place]) 12/27/2016   Acute on chronic diastolic CHF (congestive heart failure) ([place]) 12/27/2016   Left upper extremity swelling 12/27/2016   Pain of left breast 12/27/2016   Arm muscle atrophy 11/16/2016   Insomnia 02/25/2016   Renal cyst 11/21/2015   Renal mass, right 11/10/2015   Shoulder pain, right 08/13/2015   Weight gain 08/12/2014   Multinodular goiter 05/01/2014   Encounter for therapeutic drug monitoring 08/14/2013   S/P aortic valve replacement with bioprosthetic valve and maze procedure 04/12/2013   Nodule of right lung 04/07/2013   Nodule of left lung 04/07/2013   Pain in limb 05/22/2012   Dermatitis 01/26/2012   Long term (current) use of anticoagulants 08/17/2011   Anemia in chronic renal disease 05/13/2011   Hypokalemia 04/26/2011   Immunosuppression ([place]) 04/26/2011   Hypertension associated with diabetes ([place]) 04/26/2011   Renal transplant recipient 03/16/2011   Low back pain 02/23/2011   Hypersalivation 11/24/2010   AORTIC STENOSIS 08/20/2010   DISTURBANCE OF SALIVARY SECRETION 07/28/2010   NAUSEA 04/14/2010   SMOKER 10/07/2009   ALOPECIA 10/07/2009   CAROTID STENOSIS 03/04/2009   AF (paroxysmal atrial fibrillation) ([place]) 12/31/2008   NEOPLASM, MALIGNANT, KIDNEY 07/02/2008   APHASIA DUE TO CEREBROVASCULAR DISEASE 07/02/2008   Chronic fatigue 05/21/2008   Hyperlipidemia LDL goal <70 09/27/2007   Constipation 09/27/2007   SLEEP APNEA 09/27/2007   CHOLELITHIASIS 81/44/8185   HELICOBACTER PYLORI INFECTION, HX OF 06/08/2007   Type 2 diabetes mellitus with chronic kidney disease, with long-term current use of insulin ([place]) 05/23/2007   Gout 05/23/2007   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  Goals of Care Family Meeting: Goals of care: Broad aims of medical therapy in relation to the patient's values and preferences. Our aim is to provide medical care aimed at enabling patients to achieve the goals that matter most to them, given the circumstances of their particular medical situation and their constraints.  I met with the patient, patient's husband, 2 sons and sister at the bedside.  They have had some initial discussions about palliative care and goals of care done previously by my colleague Dr. Domingo Cocking.  Brief life review performed.  The patient lives at home with her husband.  There are other family members at home.  The patient had some wound care at home but otherwise the patient's husband was her primary caregiver. We discussed frankly but compassionately about several medical issues the patient is faced with.  We discussed about underlying serious conditions patient already has which include her previous history of embolic CVA with resultant aphasia, bilateral lower extremity ulcers, end-stage renal disease for which the patient is status post renal transplant, aortic stenosis status post valve replacement, she also has heart failure.  She recently was seen by vascular surgery.  She remains admitted for sepsis, Proteus urinary tract infection, left lower extremity cellulitis and peripheral arterial disease.  Palliative medicine team has been following for pain management and continues to engage in goals of care discussions.  Discussed about CODE STATUS in detail.  Discussed about differences between full code versus DO NOT RESUSCITATE/DO NOT INTUBATE.  Given the burden of the patient's acute and chronic conditions are, offered medical  recommendation for establishing DO NOT RESUSCITATE/DO NOT INTUBATE.  Family will discuss further amongst themselves to reach her decision.  We discussed about various options for next steps/appropriate disposition.  Differences between SNF rehab with palliative, home with home health was explored in detail.  All of these aspects were addressed to the best of my ability.  Additionally, also discussed about hospice philosophy of care.  Discussed about differences between home with hospice versus residential hospice. Plan: Continue current mode of care, monitor hospital course and overall disease trajectory for a time-limited trial of current interventions for the next 24-48 hours.  Patient's family to continue discussions amongst themselves about DO NOT RESUSCITATE/DO NOT INTUBATE as well as about home with hospice versus residential hospice.  Palliative will continue to follow along and assist. - Goals of Care and Additional Recommendations:  Limitations on Scope of Treatment: Full Scope Treatment  Code Status:    Code Status Orders  (From admission, onward)         Start     Ordered   06/03/20 0919  Full code  Continuous        06/03/20 0918        Code Status History    Date Active Date Inactive Code Status Order ID Comments User Context   05/30/2020 1320 05/30/2020 2355 Full Code [id number]  Elam Dutch, MD Inpatient   10/01/2019 2327 10/11/2019 1851 Full Code [id number]  Etta Quill, DO ED   12/19/2018 2042 12/26/2018 1751 Full Code [id number]  [place] Eric J, DO Inpatient   02/04/2018 0244 02/08/2018  1925 Full Code [id number]  Vianne Bulls, MD ED   12/27/2016 2146 01/01/2017 1532 Full Code [id number]  Ivor Costa, MD ED   04/12/2013 1330 04/23/2013 1855 Full Code [id number]  Rexene Alberts, MD Inpatient   Advance Care Planning Activity    Advance Directive Documentation     Most Recent Value  Type of Advance Directive Healthcare Power of Attorney  Pre-existing out of facility DNR  order (yellow form or pink MOST form) --  "MOST" Form in Place? --       Prognosis:   Unable to determine  Discharge Planning:  To be determined.   Care plan was discussed with husband,sister, 2 sons, patient, nursing staff and also with Dr. Cyndia Skeeters  Thank you for allowing the Palliative Medicine Team to assist in the care of this patient.   Time In: 1400 Time Out: 1440 Total Time 40 Prolonged Time Billed No  Greater than 50%  of this time was spent counseling and coordinating care related to the above assessment and plan.  Loistine Chance, MD  Please contact Palliative Medicine Team phone at [phone] for questions and concerns.

## 2020-06-10 NOTE — Care Management Important Message (Signed)
Important Message  Patient Details IM Letter given to the Patient. Name: [name] MRN: [id number] Date of Birth: March 05, 1946   Medicare Important Message Given:  Yes     Kerin Salen 06/10/2020, 10:51 AM

## 2020-06-10 NOTE — Progress Notes (Signed)
PROGRESS NOTE  [name] JJK:[id number] DOB: [birth year]   PCP: [name], MD  Patient is from: Home  DOA: 06/03/2020 LOS: 7  Chief complaints: Fever  Brief Narrative / Interim history: 74 year old female with history of CAD s/p stents, severe AS s/p AVR, OSA/COPD/chronic RF with as needed O2, diastolic CHF, A. Fib s/p maze on warfarin, DM-2, ESRD s/p transplant now with CKD-4, adenocarcinoma of lung, embolic CVA with residual aphasia and BLE nonhealing wounds s/p abdominal aortogram on 11/12 found to have fever, tachypnea, tachycardia with difficulty clearing secretions after she called EMS for epistaxis.  Patient was admitted for sepsis due to LLE purulent cellulitis and Proteus UTI, and A. fib with RVR, acute on chronic diastolic CHF and acute on chronic hypoxemic RF.  Empirically treated with vancomycin and meropenem which were narrowed to Zyvox.  Also treated with IV Lasix for CHF exacerbation.  Cardiology and palliative medicine following.  PMT planning to meet with family today to discuss goals of care.  Subjective: Seen and examined earlier this morning.  No major events overnight of this morning.  Patient is awake and alert but less interactive.  Does not appear to be in distress.  Objective: Vitals:   06/09/20 1551 06/09/20 2100 06/10/20 0500 06/10/20 0559  BP: 129/73 (!) 147/75  114/64  Pulse: 76 83  98  Resp:  (!) 22  (!) 21  Temp: 99 F (37.2 C) 99.4 F (37.4 C)  98.9 F (37.2 C)  TempSrc:  Axillary  Axillary  SpO2: 92% 91%  90%  Weight:   82.3 kg 81.8 kg  Height:        Intake/Output Summary (Last 24 hours) at 06/10/2020 1216 Last data filed at 06/10/2020 0800 Gross per 24 hour  Intake 250 ml  Output 0 ml  Net 250 ml   Filed Weights   06/09/20 0442 06/10/20 0500 06/10/20 0559  Weight: 83.6 kg 82.3 kg 81.8 kg    Examination:  GENERAL: Chronically ill-appearing.  Nontoxic. HEENT: MMM.  Vision and hearing grossly intact.  NECK: Supple.  No  apparent JVD.  RESP: On RA.  No IWOB.  Fair aeration bilaterally. CVS:  RRR. Heart sounds normal.  ABD/GI/GU: BS+. Abd soft, NTND.  MSK/EXT:  Moves extremities.  Edema, bulla and ulceration in her left leg.  Tender to palpation. SKIN: Erythema, bulla and ulceration in her left leg, and some ulceration and edema in right leg as well NEURO: Awake and alert.  Oriented to self and place.  Some expressive aphasia. PSYCH: Calm. Normal affect.    Procedures:  None  Microbiology summarized: [id number], influenza and RSV PCR nonreactive. Urine culture with 10,000 colonies of Proteus mirabilis resistant to Macrobid and imipenem Blood cultures NGTD.  Assessment & Plan: Sepsis due to LLE purulent cellulitis/infected nonhealing wounds due to PAD and possible Proteus UTI: POA.  Blood cultures NGTD.  Urine culture with 10,000 colonies of Proteus mirabilis.  TTE did not reveal large vegetation.  Patient had an abdominal aortogram on 11/12 that showed heavily calcified bilateral proximal arteries with flush occlusion of bilateral superficial femoral artery.  CT LLE revealed open ulceration with cellulitis and mayofascitis -Per VVS, amputation would be the next consideration if pain is unbearable -Appreciate PMT with help help on pain control and goal of care -Vancomycin and meropenem>> Zyvox -Appreciate help by wound care. -Discontinue amlodipine due to edema  Acute on chronic hypoxemic respiratory failure: Likely due to CHF exacerbation. Acute on chronic diastolic CHF: TTE with EF of 55  to 60%, RWMA, RVSP of 73.4, severe LAE, moderate RAE and moderate to severe MVR, severe mitral annular calcification and severe TVR.  On p.o. torsemide.  I&O incomplete.  Still with BLE edema -Cardiology managing.  On Demadex 100 mg daily -Monitor fluid status and renal functions  Moderate to severe mitral valve regurgitation Severe tricuspid valve regurgitation -Not a candidate for surgical intervention. -Diuretics  as above.  History of severe AS s/p AVR with prosthetic valve: Seems stable on recent TTE. -Appreciate help by cardiology  A. fib with RVR: Rate controlled.  On metoprolol and warfarin at home. -Continue metoprolol and heparin drip  History of CAD s/p stents -Continue metoprolol, statin  History of embolic CVA with residual expressive aphasia: CT head ->prior infarct at the junction of the left temporal parietal and occipital lobes, no acute infarct.  -Appreciate help by SLP  Abdominal pain: CT abdomen and pelvis without contrast revealed new body wall edema.  Her recent arthrogram but doesn't look like ischemic bowel.  Seems to have resolved.  History of ESRD status post renal transplant now with CKD-4: On low-dose prednisone and tacrolimus -Continue tacrolimus and prednisone per nephrology recommendation -Liberated diet due to poor p.o. intake  Uncontrolled DM-2 with hyperglycemia: A1c 9.2%.  Recent Labs  Lab 06/09/20 1205 06/09/20 1712 06/09/20 2051 06/10/20 0814 06/10/20 1120  GLUCAP 202* 185* 120* 161* 130*  -Continue SSI-thin with nightly coverage -Continue NovoLog AC 3 units  Essential hypertension: Normotensive this morning. -Continue p.o. metoprolol and diuretics as above.  Hyperlipidemia -Continue statin  Left arm swelling: LUE Doppler negative for DVT.  Goal of care: Patient with multiple comorbidities as above.  Very grim prognosis.  She is still full code which I believe would pose more harm than benefit.  Palliative medicine following. -Appreciate help by palliative medicine-plan for family meeting today  Inadequate oral intake Body mass index is 34.07 kg/m. Nutrition Problem: Inadequate oral intake Etiology: decreased appetite Signs/Symptoms: per patient/family report Interventions: Ensure Enlive (each supplement provides 350kcal and 20 grams of protein), Magic cup, MVI, Liberalize Diet   DVT prophylaxis:  On heparin drip for A. fib  Code Status:  Full code Family Communication: Patient and/or RN. Available if any question.  Status is: Inpatient  Remains inpatient appropriate because:Ongoing active pain requiring inpatient pain management, Unsafe d/c plan, IV treatments appropriate due to intensity of illness or inability to take PO and Inpatient level of care appropriate due to severity of illness   Dispo: The patient is from: Home              Anticipated d/c is to: SNF unless family likes to pursue comfort measures              Anticipated d/c date is: > 3 days              Patient currently is not medically stable to d/c.       Consultants:  Cardiology Vascular surgery Nephrology over the phone Palliative medicine   Sch Meds:  Scheduled Meds:  acetaminophen  650 mg Oral Q6H   collagenase   Topical Daily   feeding supplement  237 mL Oral TID BM   fentaNYL  1 patch Transdermal Q72H   insulin aspart  0-5 Units Subcutaneous QHS   insulin aspart  0-6 Units Subcutaneous TID WC   insulin aspart  2 Units Subcutaneous TID WC   linezolid  600 mg Oral Q12H   metoprolol tartrate  2.5 mg Intravenous Q6H  predniSONE  5 mg Oral QAC breakfast   sodium chloride flush  3 mL Intravenous Q12H   tacrolimus  3 mg Oral BID   torsemide  100 mg Oral Daily   Continuous Infusions:  heparin 1,650 Units/hr (06/10/20 0530)   PRN Meds:.HYDROmorphone (DILAUDID) injection, oxyCODONE, polyethylene glycol  Antimicrobials: Anti-infectives (From admission, onward)   Start     Dose/Rate Route Frequency Ordered Stop   06/06/20 2200  linezolid (ZYVOX) tablet 600 mg        600 mg Oral Every 12 hours 06/06/20 1136     06/05/20 2200  linezolid (ZYVOX) IVPB 600 mg  Status:  Discontinued        600 mg 300 mL/hr over 60 Minutes Intravenous Every 12 hours 06/05/20 1814 06/06/20 1136   06/05/20 1400  ceFAZolin (ANCEF) IVPB 1 g/50 mL premix  Status:  Discontinued        1 g 100 mL/hr over 30 Minutes Intravenous Every 8 hours 06/05/20  0931 06/05/20 1006   06/05/20 1100  ceFAZolin (ANCEF) IVPB 1 g/50 mL premix  Status:  Discontinued        1 g 100 mL/hr over 30 Minutes Intravenous Every 12 hours 06/05/20 1006 06/05/20 1811   06/04/20 2200  cefTRIAXone (ROCEPHIN) 2 g in sodium chloride 0.9 % 100 mL IVPB  Status:  Discontinued        2 g 200 mL/hr over 30 Minutes Intravenous Every 24 hours 06/04/20 1315 06/05/20 0931   06/04/20 1415  linezolid (ZYVOX) IVPB 600 mg  Status:  Discontinued        600 mg 300 mL/hr over 60 Minutes Intravenous Every 12 hours 06/04/20 1315 06/05/20 0931   06/04/20 1415  metroNIDAZOLE (FLAGYL) tablet 500 mg  Status:  Discontinued        500 mg Oral Every 8 hours 06/04/20 1315 06/05/20 0931   06/03/20 1800  meropenem (MERREM) 500 mg in sodium chloride 0.9 % 100 mL IVPB  Status:  Discontinued        500 mg 200 mL/hr over 30 Minutes Intravenous Every 12 hours 06/03/20 0907 06/04/20 1315   06/03/20 0909  vancomycin variable dose per unstable renal function (pharmacist dosing)  Status:  Discontinued         Does not apply See admin instructions 06/03/20 0909 06/04/20 1315   06/03/20 0430  ceFEPIme (MAXIPIME) 2 g in sodium chloride 0.9 % 100 mL IVPB        2 g 200 mL/hr over 30 Minutes Intravenous  Once 06/03/20 0423 06/03/20 0617   06/03/20 0430  piperacillin-tazobactam (ZOSYN) IVPB 3.375 g        3.375 g 12.5 mL/hr over 240 Minutes Intravenous  Once 06/03/20 0423 06/03/20 0909   06/03/20 0430  vancomycin (VANCOCIN) IVPB 1000 mg/200 mL premix        1,000 mg 200 mL/hr over 60 Minutes Intravenous  Once 06/03/20 0423 06/03/20 0617       I have personally reviewed the following labs and images: CBC: Recent Labs  Lab 06/05/20 0034 06/06/20 0440 06/07/20 0450 06/09/20 0418 06/10/20 0414  WBC 14.5* 13.4* 13.3* 12.1* 9.7  NEUTROABS  --   --   --  10.2* 8.1*  HGB 12.4 11.6* 11.1* 11.1* 11.3*  HCT 42.2 39.2 37.5 36.7 37.7  MCV 81.3 80.8 79.6* 77.9* 78.2*  PLT 168 186 193 219 220   BMP  &GFR Recent Labs  Lab 06/06/20 0440 06/07/20 0450 06/08/20 0350 06/09/20 0418 06/10/20 0414  NA  139 141 141 137 139  K 3.2* 2.9* 3.5 4.0 3.8  CL 95* 96* 97* 95* 96*  CO2 32 33* 29 31 30   GLUCOSE 128* 81 108* 276* 168*  BUN 44* 41* 39* 43* 46*  CREATININE 2.64* 2.48* 2.48* 2.67* 2.66*  CALCIUM 9.4 9.4 9.5 9.6 9.9  MG  --   --  2.1 2.0 1.9  PHOS  --   --   --  3.7 2.8   Estimated Creatinine Clearance: 17.4 mL/min (A) (by C-G formula based on SCr of 2.66 mg/dL (H)). Liver & Pancreas: Recent Labs  Lab 06/08/20 0350 06/09/20 0418 06/10/20 0414  AST 54*  --   --   ALT 18  --   --   ALKPHOS 672*  --   --   BILITOT 1.0  --   --   PROT 6.4*  --   --   ALBUMIN 2.4* 2.3* 2.4*   No results for input(s): LIPASE, AMYLASE in the last 168 hours. No results for input(s): AMMONIA in the last 168 hours. Diabetic: No results for input(s): HGBA1C in the last 72 hours. Recent Labs  Lab 06/09/20 1205 06/09/20 1712 06/09/20 2051 06/10/20 0814 06/10/20 1120  GLUCAP 202* 185* 120* 161* 130*   Cardiac Enzymes: Recent Labs  Lab 06/05/20 1159  CKTOTAL 98   No results for input(s): PROBNP in the last 8760 hours. Coagulation Profile: Recent Labs  Lab 06/04/20 0423  INR 1.4*   Thyroid Function Tests: No results for input(s): TSH, T4TOTAL, FREET4, T3FREE, THYROIDAB in the last 72 hours. Lipid Profile: No results for input(s): CHOL, HDL, LDLCALC, TRIG, CHOLHDL, LDLDIRECT in the last 72 hours. Anemia Panel: No results for input(s): VITAMINB12, FOLATE, FERRITIN, TIBC, IRON, RETICCTPCT in the last 72 hours. Urine analysis:    Component Value Date/Time   COLORURINE YELLOW 06/03/2020 0429   APPEARANCEUR CLEAR 06/03/2020 0429   LABSPEC 1.012 06/03/2020 0429   PHURINE 7.0 06/03/2020 0429   GLUCOSEU 150 (A) 06/03/2020 0429   GLUCOSEU 100 (A) 07/02/2016 1440   HGBUR NEGATIVE 06/03/2020 0429   BILIRUBINUR NEGATIVE 06/03/2020 0429   KETONESUR NEGATIVE 06/03/2020 0429   PROTEINUR >=300  (A) 06/03/2020 0429   UROBILINOGEN 0.2 07/02/2016 1440   NITRITE NEGATIVE 06/03/2020 0429   LEUKOCYTESUR SMALL (A) 06/03/2020 0429   Sepsis Labs: Invalid input(s): PROCALCITONIN, [place]  Microbiology: Recent Results (from the past 240 hour(s))  Culture, blood (Routine x 2)     Status: None   Collection Time: 06/03/20  4:07 AM   Specimen: BLOOD  Result Value Ref Range Status   Specimen Description   Final    BLOOD RIGHT ANTECUBITAL Performed at [facility] [address] [place], [place] [address]    Special Requests   Final    BOTTLES DRAWN AEROBIC AND ANAEROBIC Blood Culture adequate volume Performed at [facility] [address], [place], [place] [address]    Culture   Final    NO GROWTH 5 DAYS Performed at [facility], [place] [address] [place], [place] [address]    Report Status 06/08/2020 FINAL  Final  Culture, blood (Routine x 2)     Status: None   Collection Time: 06/03/20  4:07 AM   Specimen: BLOOD RIGHT HAND  Result Value Ref Range Status   Specimen Description   Final    BLOOD RIGHT HAND Performed at [facility] [address] [place], [place] [address]    Special Requests  Final    BOTTLES DRAWN AEROBIC AND ANAEROBIC Blood Culture results may not be optimal due to an inadequate volume of blood received in culture bottles Performed at [facility], [place] [address] [place], [place] [address]    Culture   Final    NO GROWTH 5 DAYS Performed at [facility], [place] [address] [place], [place] [address]    Report Status 06/08/2020 FINAL  Final  Urine culture     Status: Abnormal   Collection Time: 06/03/20  4:29 AM   Specimen: In/Out Cath Urine  Result Value Ref Range Status   Specimen Description   Final    IN/OUT CATH URINE Performed at [facility] [address], [place], [place] [address]    Special Requests   Final    NONE Performed  at [facility], [place] [address], [place], [place] [address]    Culture 10,000 COLONIES/mL PROTEUS MIRABILIS (A)  Final   Report Status 06/05/2020 FINAL  Final   Organism ID, Bacteria PROTEUS MIRABILIS (A)  Final      Susceptibility   Proteus mirabilis - MIC*    AMPICILLIN <=2 SENSITIVE Sensitive     CEFAZOLIN <=4 SENSITIVE Sensitive     CEFEPIME <=0.12 SENSITIVE Sensitive     CEFTRIAXONE <=0.25 SENSITIVE Sensitive     CIPROFLOXACIN <=0.25 SENSITIVE Sensitive     GENTAMICIN <=1 SENSITIVE Sensitive     IMIPENEM 8 INTERMEDIATE Intermediate     NITROFURANTOIN 128 RESISTANT Resistant     TRIMETH/SULFA <=20 SENSITIVE Sensitive     AMPICILLIN/SULBACTAM <=2 SENSITIVE Sensitive     PIP/TAZO <=4 SENSITIVE Sensitive     * 10,000 COLONIES/mL PROTEUS MIRABILIS  Resp Panel by RT PCR (RSV, Flu A&B, Covid) - Nasopharyngeal Swab     Status: None   Collection Time: 06/03/20  8:14 AM   Specimen: Nasopharyngeal Swab  Result Value Ref Range Status   SARS Coronavirus 2 by RT PCR NEGATIVE NEGATIVE Final    Comment: (NOTE) SARS-CoV-2 target nucleic acids are NOT DETECTED.  The SARS-CoV-2 RNA is generally detectable in upper respiratoy specimens during the acute phase of infection. The lowest concentration of SARS-CoV-2 viral copies this assay can detect is 131 copies/mL. A negative result does not preclude SARS-Cov-2 infection and should not be used as the sole basis for treatment or other patient management decisions. A negative result may occur with  improper specimen collection/handling, submission of specimen other than nasopharyngeal swab, presence of viral mutation(s) within the areas targeted by this assay, and inadequate number of viral copies (<131 copies/mL). A negative result must be combined with clinical observations, patient history, and epidemiological information. The expected result is Negative.  Fact Sheet for Patients:   PinkCheek.be  Fact Sheet for Healthcare Providers:  GravelBags.it  This test is no t yet approved or cleared by the [place] FDA and  has been authorized for detection and/or diagnosis of SARS-CoV-2 by FDA under an Emergency Use Authorization (EUA). This EUA will remain  in effect (meaning this test can be used) for the duration of the COVID-19 declaration under Section 564(b)(1) of the Act, 21 U.S.C. section 360bbb-3(b)(1), unless the authorization is terminated or revoked sooner.     Influenza A by PCR NEGATIVE NEGATIVE Final   Influenza B by PCR NEGATIVE NEGATIVE Final    Comment: (NOTE) The Xpert Xpress SARS-CoV-2/FLU/RSV assay is intended as an aid in  the diagnosis of influenza from Nasopharyngeal swab specimens and  should not be used as a sole basis for treatment. Nasal washings and  aspirates are unacceptable for Xpert Xpress SARS-CoV-2/FLU/RSV  testing.  Fact Sheet for Patients: PinkCheek.be  Fact Sheet for Healthcare Providers: GravelBags.it  This test is not yet approved or cleared by the [place] FDA and  has been authorized for detection and/or diagnosis of SARS-CoV-2 by  FDA under an Emergency Use Authorization (EUA). This EUA will remain  in effect (meaning this test can be used) for the duration of the  Covid-19 declaration under Section 564(b)(1) of the Act, 21  U.S.C. section 360bbb-3(b)(1), unless the authorization is  terminated or revoked.    Respiratory Syncytial Virus by PCR NEGATIVE NEGATIVE Final    Comment: (NOTE) Fact Sheet for Patients: PinkCheek.be  Fact Sheet for Healthcare Providers: GravelBags.it  This test is not yet approved or cleared by the [place] FDA and  has been authorized for detection and/or diagnosis of SARS-CoV-2 by  FDA under an Emergency  Use Authorization (EUA). This EUA will remain  in effect (meaning this test can be used) for the duration of the  COVID-19 declaration under Section 564(b)(1) of the Act, 21 U.S.C.  section 360bbb-3(b)(1), unless the authorization is terminated or  revoked. Performed at [name], [place] [address], [place], [place] [address]   MRSA PCR Screening     Status: Abnormal   Collection Time: 06/03/20  2:00 PM   Specimen: Nasal Mucosa; Nasopharyngeal  Result Value Ref Range Status   MRSA by PCR POSITIVE (A) NEGATIVE Final    Comment:        The GeneXpert MRSA Assay (FDA approved for NASAL specimens only), is one component of a comprehensive MRSA colonization surveillance program. It is not intended to diagnose MRSA infection nor to guide or monitor treatment for MRSA infections. RESULT CALLED TO, READ BACK BY AND VERIFIED WITH: HEAVNER,L. RN @1848  06/03/20 BILLINGSLEY,L Performed at [facility], [place] [address], [place], [place] [address]     Radiology Studies: No results found.    Londyn Wotton T. [place]  If 7PM-7AM, please contact night-coverage [email or web address] 06/10/2020, 12:16 PM

## 2020-06-10 NOTE — Progress Notes (Signed)
[facility] for heparin Indication: hx bioprosthetic AVR, stroke, afib  Allergies  Allergen Reactions  . Ibuprofen Nausea And Vomiting  . Sulfamethoxazole-Trimethoprim Itching, Swelling and Rash    Swelling of the face  . Sulfonamide Derivatives Itching, Swelling and Rash    Swelling of the face  . Tape Rash    Paper tape is ok  . Tramadol Nausea And Vomiting  . Doxycycline Nausea Only  . Hydrocil [Psyllium] Nausea And Vomiting  . Bactrim Itching, Swelling and Rash  . Red Dye Itching and Rash   Patient Measurements: Height: 5\' 1"  (154.9 cm) Weight: 81.8 kg (180 lb 5.4 oz) IBW/kg (Calculated) : 47.8 Heparin Dosing Weight: 66.4 kg  Vital Signs: Temp: 98.9 F (37.2 C) (11/23 1358) Temp Source: Axillary (11/23 0559) BP: 140/71 (11/23 1358) Pulse Rate: 80 (11/23 1358)  Labs: Recent Labs    06/08/20 0350 06/08/20 0404 06/09/20 0418 06/10/20 0414 06/10/20 1322  HGB  --   --  11.1* 11.3*  --   HCT  --   --  36.7 37.7  --   PLT  --   --  219 220  --   HEPARINUNFRC  --    < > 0.41 <0.10* 0.20*  CREATININE 2.48*  --  2.67* 2.66*  --    < > = values in this interval not displayed.    Estimated Creatinine Clearance: 17.4 mL/min (A) (by C-G formula based on SCr of 2.66 mg/dL (H)).   Medications:  - on warfarin PTA  Assessment: Patient is a 74 y.o F with hx bioprosthetic AVR, stroke, and afib on warfarin PTA presented to the ED on 11/16 with fever. Pharmacy is consulted to transition to heparin drip on admission.  Today, 06/10/2020:  Heparin level undetectable this morning due to patient pulling out IV site overnight (IV heparin was not running when this level was collected)  Afternoon heparin level = 0.2 units/mL, subtherapeutic on heparin infusion at 1650 units/hr despite being therapeutic on this rate for the past several days   CBC stable  SCr 2.66, CrCl ~ 17 ml/min    No bleeding issues noted per nursing  Per RN, no  infusion issues since IV site replaced this AM  Noted plans for palliative care to meet with family this afternoon to discuss goals of care   Goal of Therapy:  Heparin level 0.3-0.7 units/ml Monitor platelets by anticoagulation protocol: Yes   Plan:   Increase heparin infusion to 1800 units/hr  Heparin level 8 hours after rate change  Daily heparin level, CBC  Monitor closely for s/sx of bleeding  F/u goals of care   Lindell Spar, PharmD, BCPS Clinical Pharmacist  06/10/2020, 2:41 PM

## 2020-06-10 NOTE — Progress Notes (Addendum)
Progress Note  Patient Name: [name] Date of Encounter: 06/10/2020  [facility] Cardiologist: [name], MD   Subjective   Patient pulled out IV overnight. Heparin level this AM. Apparently patient/family refused BB yesterday. Afib rates elevated, 100-120s. Patient is awake but not very responsive. Responds to pain. Palliative plan for family meeting today. Creatinine and Bps stable.  Inpatient Medications    Scheduled Meds: . acetaminophen  650 mg Oral Q6H  . collagenase   Topical Daily  . feeding supplement  237 mL Oral TID BM  . fentaNYL  1 patch Transdermal Q72H  . insulin aspart  0-5 Units Subcutaneous QHS  . insulin aspart  0-6 Units Subcutaneous TID WC  . insulin aspart  2 Units Subcutaneous TID WC  . linezolid  600 mg Oral Q12H  . metoprolol tartrate  2.5 mg Intravenous Q6H  . predniSONE  5 mg Oral QAC breakfast  . sodium chloride flush  3 mL Intravenous Q12H  . tacrolimus  3 mg Oral BID  . torsemide  100 mg Oral Daily   Continuous Infusions: . heparin 1,650 Units/hr (06/10/20 1243)   PRN Meds: HYDROmorphone (DILAUDID) injection, oxyCODONE, polyethylene glycol   Vital Signs    Vitals:   06/09/20 1551 06/09/20 2100 06/10/20 0500 06/10/20 0559  BP: 129/73 (!) 147/75  114/64  Pulse: 76 83  98  Resp:  (!) 22  (!) 21  Temp: 99 F (37.2 C) 99.4 F (37.4 C)  98.9 F (37.2 C)  TempSrc:  Axillary  Axillary  SpO2: 92% 91%  90%  Weight:   82.3 kg 81.8 kg  Height:        Intake/Output Summary (Last 24 hours) at 06/10/2020 1253 Last data filed at 06/10/2020 0800 Gross per 24 hour  Intake 250 ml  Output 0 ml  Net 250 ml   Last 3 Weights 06/10/2020 06/10/2020 06/09/2020  Weight (lbs) 180 lb 5.4 oz 181 lb 7 oz 184 lb 4.9 oz  Weight (kg) 81.8 kg 82.3 kg 83.6 kg      Telemetry    Afib, HR 100-120, PVCs - Personally Reviewed  ECG    No new - Personally Reviewed  Physical Exam   GEN: No acute distress.   Neck: + JVD Cardiac: Irreg Irreg,  tachy, no murmurs, rubs, or gallops.  Respiratory: Clear to auscultation bilaterally. GI: Soft, nontender, non-distended  MS: mild lower leg edema; No deformity. Neuro:  Nonfocal  Psych: Normal affect   Labs    High Sensitivity Troponin:   Recent Labs  Lab 06/03/20 0619 06/03/20 0815 06/03/20 1009  TROPONINIHS 161* 151* 174*      Chemistry Recent Labs  Lab 06/08/20 0350 06/09/20 0418 06/10/20 0414  NA 141 137 139  K 3.5 4.0 3.8  CL 97* 95* 96*  CO2 29 31 30   GLUCOSE 108* 276* 168*  BUN 39* 43* 46*  CREATININE 2.48* 2.67* 2.66*  CALCIUM 9.5 9.6 9.9  PROT 6.4*  --   --   ALBUMIN 2.4* 2.3* 2.4*  AST 54*  --   --   ALT 18  --   --   ALKPHOS 672*  --   --   BILITOT 1.0  --   --   GFRNONAA 20* 18* 18*  ANIONGAP 15 11 13      Hematology Recent Labs  Lab 06/07/20 0450 06/09/20 0418 06/10/20 0414  WBC 13.3* 12.1* 9.7  RBC 4.71 4.71 4.82  HGB 11.1* 11.1* 11.3*  HCT 37.5 36.7 37.7  MCV 79.6* 77.9* 78.2*  MCH 23.6* 23.6* 23.4*  MCHC 29.6* 30.2 30.0  RDW 20.2* 20.0* 20.2*  PLT 193 219 220    BNPNo results for input(s): BNP, PROBNP in the last 168 hours.   DDimer No results for input(s): DDIMER in the last 168 hours.   Radiology    No results found.  Cardiac Studies    Echo 06/03/20: 1. Left ventricular ejection fraction, by estimation, is 55 to 60%. The  left ventricle has normal function. The left ventricle demonstrates  regional wall motion abnormalities (see scoring diagram/findings for  description). There is mild left ventricular  hypertrophy. Left ventricular diastolic parameters are indeterminate.  2. Right ventricular systolic function is moderately reduced. The right  ventricular size is moderately enlarged. There is severely elevated  pulmonary artery systolic pressure. The estimated right ventricular  systolic pressure is 48.1 mmHg.  3. Left atrial size was severely dilated.  4. Right atrial size was moderately dilated.  5. Suspect  small flail segment on anterior mitral leaflet causing very  eccentric posteriorly directed mitral valve regurgitation that appears at  least moderate-severe. From short axis view may involve A3 scallop.. The  mitral valve is abnormal. Moderate  to severe mitral valve regurgitation. Severe mitral annular calcification.  6. Tricuspid valve regurgitation is severe.  7. The aortic valve has been repaired/replaced. Aortic valve  regurgitation is not visualized. There is a 23 mm Edwards bovine valve  present in the aortic position. Procedure Date: 04/07/2013. Echo findings  are consistent with normal structure and  function of the aortic valve prosthesis. Aortic valve mean gradient  measures 18.2 mmHg.  8. The inferior vena cava is dilated in size with <50% respiratory  variability, suggesting right atrial pressure of 15 mmHg.    Patient Profile     74 y.o. female with CAD s/p PCI, AS s/p bioprosthetic AVR, ESRD status post renal transplant, CKD 4, severe pulmonary hypertensin, embolic stroke with expressive aphasia, diabetes, hypertension, hyperlipidemia, OSA, COPD, atrial fibrillation status post Maze procedure, chronic diastolic heart failure, PAD, and lung adenocarcinoma that is untreated admitted with lower extremity ulcers, sepsis secondary to cellulitis, and atrial fibrillation with rapid ventricular response.   Assessment & Plan    Aifb RVR  - BB refused yesterday and afib rates up. Patient was taking metoprolol 25mg  BID. Will start IV metoprolol.  - warfarin on hold and on IV heparin>>plan to resume warfarin once there is it's clear she won't need procedures (might need amputation if pain is unbearable per VVS)  Sepsis/cellulitis/PAD with non-healing wounds - CT LLE showed diffuse myofascitis - abx per IM - wound care following  Acute on chronic diastolic heart failure/severe TR/mild to mod MR - flail mitral valve leaflet with moderate to severe TR - given malignancy not  likely a candidate for Mitraclip or open surgery - Diuresis with Torsemide 100mg  daily - I/Os do not appear accurate - creatinine stable - has some mild lower leg edema and amlodipine was d/c'd, lungs clear. Continue Torsemide  HTN - amlodipine d/c'd  for lower leg edema - metoprolol 25mg  BID - BP today 114/64   CAD s/p PCI/PAD - CT showed diffuse calcification - not on ASA due to warfarin  S/p AVR - stable per recent echo  HLD - LDL 32 - not on statin  CKD stage IV s/p renal transplant - creatinine stable 2.48>2.48>2.67>2.66  Goals of care - palliative care following   For questions or updates, please contact [facility] Please consult  [email or web address] for contact info under     Signed, Ena Dawley, MD  06/10/2020, 12:53 PM    The patient was seen, examined and discussed with Cadence Ninfa Meeker, PA-C   and I agree with the above.   Patient remains in atrial fibrillation with RVR, she is refusing p.o. meds, her heart rates in the range 80-1 20, I would increase IV metoprolol to 5 mg IV every 6 hours.  She has negative fluid balance with torsemide 100 mg daily I would continue for now.  Her creatinine has been stable at 2.6.   Ena Dawley 06/10/2020

## 2020-06-10 NOTE — Progress Notes (Signed)
Brief Pharmacy Note re: IV heparin  AM heparin level <0.1.   RN reports patient pulled out IV site x2 overnight and heparin has been off for several hours.   IV team placed new peripheral IV site ~ 0530 and RN to restart IV heparin at previous rate. Check heparin level 8h after heparin restarted.  [name], PharmD, BCPS 06/10/2020@5 :41 AM

## 2020-06-10 NOTE — Progress Notes (Signed)
Occupational Therapy Treatment Patient Details Name: [name] MRN: [id number] DOB: 06-26-1946 Today's Date: 06/10/2020    History of present illness 74 y.o. female  with past medical history of CAD status post stent, severe aortic stenosis status post AVR, hypertension, hyperlipidemia, OSA, COPD with intermittent O2 requirement, HFpEF, atrial fibrillation status post maze on Coumadin, type 2 diabetes, end-stage renal disease status post transplant, CKD 4, adenocarcinoma of the lung, embolic CVA with residual aphasia, bilateral lower extremity nonhealing wounds and recent abdominal aortogram with out actionable findings (would likely need eventual AKA) admitted on 06/03/2020 with sepsis, concern for endocarditis, A. fib and worsening renal failure.  Palliative consulted for goals of care.  Family meeting scheduled for 11/23 at 1400 to discuss Palliative.   OT comments  Patient demonstrated some slight progress by engaging in bed level grooming with max to total assist and by perform 2 reps of BUE self-ROM exercises after max cues, however pt remains in need of total care.  Patient limited by pain with all movement, expressive aphasia, refusal of PO intake, and profound weakness along with deficits noted below. Pt continues to demonstrate guarded rehab potential and may benefit from continued skilled OT on a trial basis in attempts to maximize pt's safety and independence with ADLs and functional transfers and reduce caregiver burden and fall risk.   Follow Up Recommendations  SNF;Supervision/Assistance - 24 hour    Equipment Recommendations       Recommendations for Other Services      Precautions / Restrictions Precautions Precautions: Fall Precaution Comments: AMS, severe pain of both legs, can not even touch a foot, exp aphasia Restrictions Weight Bearing Restrictions: No       Mobility Bed Mobility                  Transfers                      Balance                                            ADL either performed or assessed with clinical judgement   ADL Overall ADL's : Needs assistance/impaired   Eating/Feeding Details (indicate cue type and reason): Pt refusing PO.  Shook head and stated "no" when offered lunch. Grooming: Wash/dry hands;Wash/dry face;Moderate assistance;Total assistance;Bed level Grooming Details (indicate cue type and reason): head of bed raised: washcloth placed in pt's LT hand as RT UE very edamatous.  Pt did not move hand to face. Pt provided with Max hand over hand prompts bringing washcloth to face. Pt rested clotha and hand on face but did not move.  Total assist to wash face.  Pt then cued to wash hands with verbal and tactile cues bringing hands together with washcloth. Pt did then minimally scrub hands with cloth. Moderate assist to complete.  Lotion placed in pt's hadns and pt did rub palms together but did not apply to dorsal hands or arms despite cues. Upper Body Bathing: Total assistance;Bed level Upper Body Bathing Details (indicate cue type and reason): Total Assist to wash upper arms                                 Vision   Additional Comments: Eyes mostly staying closed. Brief eye opens to name call  but unfocused without eye contact.   Perception     Praxis      Cognition Arousal/Alertness: Lethargic Behavior During Therapy: Flat affect Overall Cognitive Status: No family/caregiver present to determine baseline cognitive functioning                                 General Comments: H/o aphasia, Only stated word "no" this visit. Answered no other questions. Unable to follow one step commands consistently and verbalize needs.        Exercises Other Exercises Other Exercises: Pt given visual and max verbal cues for self ROM of BUEs with hands clasped together. Pt did follow exercises x 2 reps after hand over hand cues to demo. Pt then terminated exercises  and allowed OT to perform 5 more with Max As. Pt then held UEs down onto bed.  RUE with 3-4+ edema and propped on 2 pillows.   Shoulder Instructions       General Comments      Pertinent Vitals/ Pain       Pain Assessment: Faces Faces Pain Scale: Hurts even more Pain Location: Gently moving RT foot more onto bed for safety. Pain Descriptors / Indicators: Moaning;Grimacing Pain Intervention(s): Limited activity within patient's tolerance;Monitored during session  Home Living                                          Prior Functioning/Environment              Frequency  Min 1X/week        Progress Toward Goals  OT Goals(current goals can now be found in the care plan section)  Progress towards OT goals: Progressing toward goals  Acute Rehab OT Goals Patient Stated Goal: none stated OT Goal Formulation: Patient unable to participate in goal setting Time For Goal Achievement: 06/19/20 Potential to Achieve Goals: [place] Discharge plan remains appropriate    Co-evaluation                 AM-PAC OT "6 Clicks" Daily Activity     Outcome Measure   Help from another person eating meals?: Total Help from another person taking care of personal grooming?: Total Help from another person toileting, which includes using toliet, bedpan, or urinal?: Total (Total face, max hands) Help from another person bathing (including washing, rinsing, drying)?: Total Help from another person to put on and taking off regular upper body clothing?: Total Help from another person to put on and taking off regular lower body clothing?: Total 6 Click Score: 6    End of Session    OT Visit Diagnosis: Feeding difficulties (R63.3);Cognitive communication deficit (R41.841);Muscle weakness (generalized) (M62.81);Other symptoms and signs involving cognitive function   Activity Tolerance Patient limited by pain   Patient Left in bed;with bed alarm set;with call  bell/phone within reach   Nurse Communication  (RN in room for most of session.)        Time: 1245-1301 OT Time Calculation (min): 16 min  Charges: OT General Charges $OT Visit: 1 Visit OT Treatments $Self Care/Home Management : 8-22 mins  Anderson Malta, [place] Office: [phone] 06/10/2020   Julien Girt 06/10/2020, 1:15 PM

## 2020-06-11 DIAGNOSIS — Z7189 Other specified counseling: Secondary | ICD-10-CM | POA: Diagnosis not present

## 2020-06-11 DIAGNOSIS — L03115 Cellulitis of right lower limb: Secondary | ICD-10-CM

## 2020-06-11 DIAGNOSIS — R531 Weakness: Secondary | ICD-10-CM | POA: Diagnosis not present

## 2020-06-11 DIAGNOSIS — I5033 Acute on chronic diastolic (congestive) heart failure: Secondary | ICD-10-CM | POA: Diagnosis not present

## 2020-06-11 DIAGNOSIS — A419 Sepsis, unspecified organism: Secondary | ICD-10-CM | POA: Diagnosis not present

## 2020-06-11 DIAGNOSIS — I4891 Unspecified atrial fibrillation: Secondary | ICD-10-CM | POA: Diagnosis not present

## 2020-06-11 DIAGNOSIS — L03116 Cellulitis of left lower limb: Secondary | ICD-10-CM

## 2020-06-11 DIAGNOSIS — L97922 Non-pressure chronic ulcer of unspecified part of left lower leg with fat layer exposed: Secondary | ICD-10-CM

## 2020-06-11 DIAGNOSIS — N390 Urinary tract infection, site not specified: Secondary | ICD-10-CM

## 2020-06-11 LAB — RENAL FUNCTION PANEL
Albumin: 2.3 g/dL — ABNORMAL LOW (ref 3.5–5.0)
Anion gap: 12 (ref 5–15)
BUN: 42 mg/dL — ABNORMAL HIGH (ref 8–23)
CO2: 29 mmol/L (ref 22–32)
Calcium: 9.8 mg/dL (ref 8.9–10.3)
Chloride: 97 mmol/L — ABNORMAL LOW (ref 98–111)
Creatinine, Ser: 2.24 mg/dL — ABNORMAL HIGH (ref 0.44–1.00)
GFR, Estimated: 22 mL/min — ABNORMAL LOW (ref >60)
Glucose, Bld: 144 mg/dL — ABNORMAL HIGH (ref 70–99)
Phosphorus: 2.9 mg/dL (ref 2.5–4.6)
Potassium: 3.7 mmol/L (ref 3.5–5.1)
Sodium: 138 mmol/L (ref 135–145)

## 2020-06-11 LAB — GLUCOSE, CAPILLARY
Glucose-Capillary: 166 mg/dL — ABNORMAL HIGH (ref 70–99)
Glucose-Capillary: 142 mg/dL — ABNORMAL HIGH (ref 70–99)
Glucose-Capillary: 208 mg/dL — ABNORMAL HIGH (ref 70–99)
Glucose-Capillary: 198 mg/dL — ABNORMAL HIGH (ref 70–99)

## 2020-06-11 LAB — CBC
HCT: 36.5 % (ref 36.0–46.0)
Hemoglobin: 11 g/dL — ABNORMAL LOW (ref 12.0–15.0)
MCH: 23.8 pg — ABNORMAL LOW (ref 26.0–34.0)
MCHC: 30.1 g/dL (ref 30.0–36.0)
MCV: 79 fL — ABNORMAL LOW (ref 80.0–100.0)
Platelets: 219 10*3/uL (ref 150–400)
RBC: 4.62 MIL/uL (ref 3.87–5.11)
RDW: 20.2 % — ABNORMAL HIGH (ref 11.5–15.5)
WBC: 11.2 10*3/uL — ABNORMAL HIGH (ref 4.0–10.5)
nRBC: 0 % (ref 0.0–0.2)

## 2020-06-11 LAB — MAGNESIUM: Magnesium: 2 mg/dL (ref 1.7–2.4)

## 2020-06-11 LAB — HEPARIN LEVEL (UNFRACTIONATED): Heparin Unfractionated: 0.55 IU/mL (ref 0.30–0.70)

## 2020-06-11 NOTE — Progress Notes (Signed)
Daily Progress Note   Patient Name: [name]       Date: 06/11/2020 DOB: Nov 01, 1945  Age: 74 y.o. MRN#: [id number] Attending Physician: Eugenie Filler, MD Primary Care Physician: [name], MD Admit Date: 06/03/2020  Reason for Consultation/Follow-up: Establishing goals of care and Pain control  Subjective:  Patient is awake alert, responds some, at times in distress due to pain from her leg. Eating few bites/sips at times.     Length of Stay: 8  Current Medications: Scheduled Meds:   acetaminophen  650 mg Oral Q6H   collagenase   Topical Daily   feeding supplement  237 mL Oral TID BM   fentaNYL  1 patch Transdermal Q72H   insulin aspart  0-5 Units Subcutaneous QHS   insulin aspart  0-6 Units Subcutaneous TID WC   insulin aspart  2 Units Subcutaneous TID WC   levothyroxine  25 mcg Intravenous Daily   metoprolol tartrate  2.5 mg Intravenous Q6H   predniSONE  5 mg Oral QAC breakfast   sodium chloride flush  3 mL Intravenous Q12H   tacrolimus  3 mg Oral BID   torsemide  100 mg Oral Daily    Continuous Infusions:  heparin 1,800 Units/hr (06/11/20 0326)   linezolid (ZYVOX) IV 600 mg (06/11/20 0513)    PRN Meds: HYDROmorphone (DILAUDID) injection, oxyCODONE, polyethylene glycol  Physical Exam    General:  Acknowledges me in room but does not engage in conversation.    HEENT: No bruits, no goiter, no JVD Heart: Regular rate and rhythm. No murmur appreciated. Lungs: Good air movement, clear Abdomen: Soft, nontender, nondistended, positive bowel sounds.  Skin: Warm and dry Neuro: reported to be more alert today, tracks me in the room, interacts meaningfully with her family, does not verbalize.      Ext: Left arm notably swollen and warm and  hand versus right arm. LLE in dressing, chart reviewed.   Vital Signs: BP 133/68 (BP Location: Right Arm)    Pulse 99    Temp 98.1 F (36.7 C) (Oral)    Resp 15    Ht 5\' 1"  (1.549 m)    Wt 81.5 kg    SpO2 97%    BMI 33.95 kg/m  SpO2: SpO2: 97 % O2 Device: O2 Device: Nasal Cannula O2 Flow Rate: O2  Flow Rate (L/min): 2 L/min  Intake/output summary:   Intake/Output Summary (Last 24 hours) at 06/11/2020 1247 Last data filed at 06/11/2020 4327 Gross per 24 hour  Intake 544.95 ml  Output 1000 ml  Net -455.05 ml   LBM: Last BM Date:  (PTA) Baseline Weight: Weight: 82 kg Most recent weight: Weight: 81.5 kg       Palliative Assessment/Data:    Flowsheet Rows     Most Recent Value  Intake Tab  Referral Department Hospitalist  Unit at Time of Referral Med/Surg Unit  Clinical Assessment  Psychosocial & Spiritual Assessment  Palliative Care Outcomes      Patient Active Problem List   Diagnosis Date Noted   Palliative care by specialist    Goals of care, counseling/discussion    General weakness    Pulmonary hypertension, unspecified ([place])    Atrial fibrillation with rapid ventricular response ([place]) 06/03/2020   Sepsis ([place]) 06/03/2020   ESRD (end stage renal disease) ([place])    Venous stasis ulcers of both lower extremities ([place]) 01/31/2020   Venous stasis dermatitis of both lower extremities 01/31/2020   Claudication of both lower extremities ([place]) 01/31/2020   Chronic venous insufficiency 12/31/2019   Heart failure ([place]) 10/02/2019   Dyspnea 08/20/2019   COPD (chronic obstructive pulmonary disease) ([place]) 08/20/2019   Hematoma of arm, left, subsequent encounter 07/23/2019   Obesity (BMI 30-39.9) 12/20/2018   Acute respiratory failure with hypoxia ([place]) 12/19/2018   Gait disorder 11/22/2018   History of seizure disorder 09/12/2018   History of stroke 09/12/2018   Edema 08/28/2018   Hypertension secondary to other renal disorders 03/22/2018    Proteinuria 03/22/2018   Malignant neoplasm of lung ([place]) 03/22/2018   Breast pain in female 02/04/2018   Lung cancer ([place]) 11/30/2017   Adenocarcinoma of lung, stage 1, right ([place]) 11/25/2017   Chronic respiratory failure with hypoxia ([place]) 12/27/2016   Acute on chronic diastolic CHF (congestive heart failure) ([place]) 12/27/2016   Left upper extremity swelling 12/27/2016   Pain of left breast 12/27/2016   Arm muscle atrophy 11/16/2016   Insomnia 02/25/2016   Renal cyst 11/21/2015   Renal mass, right 11/10/2015   Shoulder pain, right 08/13/2015   Weight gain 08/12/2014   Multinodular goiter 05/01/2014   Encounter for therapeutic drug monitoring 08/14/2013   S/P aortic valve replacement with bioprosthetic valve and maze procedure 04/12/2013   Nodule of right lung 04/07/2013   Nodule of left lung 04/07/2013   Pain in limb 05/22/2012   Dermatitis 01/26/2012   Long term (current) use of anticoagulants 08/17/2011   Anemia in chronic renal disease 05/13/2011   Hypokalemia 04/26/2011   Immunosuppression ([place]) 04/26/2011   Hypertension associated with diabetes ([place]) 04/26/2011   Renal transplant recipient 03/16/2011   Low back pain 02/23/2011   Hypersalivation 11/24/2010   AORTIC STENOSIS 08/20/2010   DISTURBANCE OF SALIVARY SECRETION 07/28/2010   NAUSEA 04/14/2010   SMOKER 10/07/2009   ALOPECIA 10/07/2009   CAROTID STENOSIS 03/04/2009   AF (paroxysmal atrial fibrillation) ([place]) 12/31/2008   NEOPLASM, MALIGNANT, KIDNEY 07/02/2008   APHASIA DUE TO CEREBROVASCULAR DISEASE 07/02/2008   Chronic fatigue 05/21/2008   Hyperlipidemia LDL goal <70 09/27/2007   Constipation 09/27/2007   SLEEP APNEA 09/27/2007   CHOLELITHIASIS 61/47/0929   HELICOBACTER PYLORI INFECTION, HX OF 06/08/2007   Type 2 diabetes mellitus with chronic kidney disease, with long-term current use of insulin ([place]) 05/23/2007   Gout 05/23/2007   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 female  with past medical history of CAD status post stent, severe aortic stenosis status post AVR, hypertension, hyperlipidemia, OSA, COPD with intermittent O2 requirement, HFpEF, atrial fibrillation status post maze on Coumadin, type 2 diabetes, end-stage renal disease status post transplant, CKD 4, adenocarcinoma of the lung, embolic CVA with residual aphasia, bilateral lower extremity nonhealing wounds and recent abdominal aortogram with out actionable findings (would likely need eventual AKA) admitted on 06/03/2020 with sepsis, concern for endocarditis, A. fib and worsening renal failure.  Palliative consulted for goals of care.  Assessment: Patient Active Problem List   Diagnosis Date Noted   Palliative care by specialist    Goals of care, counseling/discussion    General weakness    Pulmonary hypertension, unspecified ([place])    Atrial fibrillation with rapid ventricular response ([place]) 06/03/2020   Sepsis ([place]) 06/03/2020   ESRD (end stage renal disease) ([place])    Venous stasis ulcers of both lower extremities ([place]) 01/31/2020   Venous stasis dermatitis of both lower extremities 01/31/2020   Claudication of both lower extremities ([place]) 01/31/2020   Chronic venous insufficiency 12/31/2019   Heart failure ([place]) 10/02/2019   Dyspnea 08/20/2019   COPD (chronic obstructive pulmonary disease) ([place]) 08/20/2019   Hematoma of arm, left, subsequent encounter 07/23/2019   Obesity (BMI 30-39.9) 12/20/2018   Acute respiratory failure with hypoxia ([facility]) 12/19/2018   Gait disorder 11/22/2018   History of seizure disorder 09/12/2018   History of stroke 09/12/2018   Edema 08/28/2018   Hypertension secondary to other  renal disorders 03/22/2018   Proteinuria 03/22/2018   Malignant neoplasm of lung ([place]) 03/22/2018   Breast pain in female 02/04/2018   Lung cancer ([place]) 11/30/2017   Adenocarcinoma of lung, stage 1, right ([place]) 11/25/2017   Chronic respiratory failure with hypoxia ([place]) 12/27/2016   Acute on chronic diastolic CHF (congestive heart failure) ([place]) 12/27/2016   Left upper extremity swelling 12/27/2016   Pain of left breast 12/27/2016   Arm muscle atrophy 11/16/2016   Insomnia 02/25/2016   Renal cyst 11/21/2015   Renal mass, right 11/10/2015   Shoulder pain, right 08/13/2015   Weight gain 08/12/2014   Multinodular goiter 05/01/2014   Encounter for therapeutic drug monitoring 08/14/2013   S/P aortic valve replacement with bioprosthetic valve and maze procedure 04/12/2013   Nodule of right lung 04/07/2013   Nodule of left lung 04/07/2013   Pain in limb 05/22/2012   Dermatitis 01/26/2012   Long term (current) use of anticoagulants 08/17/2011   Anemia in chronic renal disease 05/13/2011   Hypokalemia 04/26/2011   Immunosuppression ([place]) 04/26/2011   Hypertension associated with diabetes ([place]) 04/26/2011   Renal transplant recipient 03/16/2011   Low back pain 02/23/2011   Hypersalivation 11/24/2010   AORTIC STENOSIS 08/20/2010   DISTURBANCE OF SALIVARY SECRETION 07/28/2010   NAUSEA 04/14/2010   SMOKER 10/07/2009   ALOPECIA 10/07/2009   CAROTID STENOSIS 03/04/2009   AF (paroxysmal atrial fibrillation) ([place]) 12/31/2008   NEOPLASM, MALIGNANT, KIDNEY 07/02/2008   APHASIA DUE TO CEREBROVASCULAR DISEASE 07/02/2008   Chronic fatigue 05/21/2008   Hyperlipidemia LDL goal <70 09/27/2007   Constipation 09/27/2007   SLEEP APNEA 09/27/2007   CHOLELITHIASIS 43/32/9518   HELICOBACTER PYLORI INFECTION, HX OF 06/08/2007   Type 2 diabetes mellitus with chronic kidney disease,  with long-term current use of insulin ([place]) 05/23/2007   Gout 05/23/2007     Hypertension due to kidney transplant 05/23/2007   MYOCARDIAL INFARCTION, HX OF 05/23/2007   Coronary atherosclerosis 05/23/2007   GERD 05/23/2007   COLONIC POLYPS, HX OF 05/23/2007   DIVERTICULOSIS, COLON 07/01/2005    Recommendations/Plan: - Pain: fentanyl patch 25 mcg/h  and scheduled on Tylenol 650 four times also has oxycodone 5 mg every 4 hours as needed for breakthrough pain.  Full code, full scope for now.  Call placed and discussed with husband, he states that their 2 daughters are arriving from out of town tonight, family to continue discussions regarding code status and next steps, PMT to follow.    Goals of Care and Additional Recommendations:  Limitations on Scope of Treatment: Full Scope Treatment  Code Status:    Code Status Orders  (From admission, onward)         Start     Ordered   06/03/20 0919  Full code  Continuous        06/03/20 0918        Code Status History    Date Active Date Inactive Code Status Order ID Comments User Context   05/30/2020 1320 05/30/2020 2355 Full Code [id number]  Elam Dutch, MD Inpatient   10/01/2019 2327 10/11/2019 1851 Full Code [id number]  Etta Quill, DO ED   12/19/2018 2042 12/26/2018 1751 Full Code [id number]  [place] Eric J, DO Inpatient   02/04/2018 0244 02/08/2018 1925 Full Code [id number]  Vianne Bulls, MD ED   12/27/2016 2146 01/01/2017 1532 Full Code [id number]  Ivor Costa, MD ED   04/12/2013 1330 04/23/2013 1855 Full Code [id number]  Rexene Alberts, MD Inpatient   Advance Care Planning Activity    Advance Directive Documentation     Most Recent Value  Type of Advance Directive Healthcare Power of Attorney  Pre-existing out of facility DNR order (yellow form or pink MOST form) --  "MOST" Form in Place? --       Prognosis:   Unable to determine  Discharge Planning:  To be determined.   Care plan was discussed with husband on phone, [place] MD and patient at bedside.   Thank you for allowing the  Palliative Medicine Team to assist in the care of this patient.   Time In: 12 Time Out: 12.25 Total Time 25 Prolonged Time Billed No  Greater than 50%  of this time was spent counseling and coordinating care related to the above assessment and plan.  Loistine Chance, MD  Please contact Palliative Medicine Team phone at [phone] for questions and concerns.

## 2020-06-11 NOTE — Progress Notes (Signed)
PROGRESS NOTE    [name]  DQQ:[id number] DOB: 06-11-1946 DOA: 06/03/2020 PCP: [name], MD    Chief Complaint  Patient presents with  . Fever    Brief Narrative:  74 year old female with history of CAD s/p stents, severe AS s/p AVR, OSA/COPD/chronic RF with as needed O2, diastolic CHF, A. Fib s/p maze on warfarin, DM-2, ESRD s/p transplant now with CKD-4, adenocarcinoma of lung, embolic CVA with residual aphasia and BLE nonhealing wounds s/p abdominal aortogram on 11/12 found to have fever, tachypnea, tachycardia with difficulty clearing secretions after she called EMS for epistaxis.  Patient was admitted for sepsis due to LLE purulent cellulitis and Proteus UTI, and A. fib with RVR, acute on chronic diastolic CHF and acute on chronic hypoxemic RF.  Empirically treated with vancomycin and meropenem which were narrowed to Zyvox.  Also treated with IV Lasix for CHF exacerbation.  Cardiology and palliative medicine following.  PMT planning to meet with family today to discuss goals of care.   Assessment & Plan:   Principal Problem:   Sepsis ([place]) Active Problems:   Type 2 diabetes mellitus with chronic kidney disease, with long-term current use of insulin (HCC)   Hyperlipidemia LDL goal <70   Renal transplant recipient   S/P aortic valve replacement with bioprosthetic valve and maze procedure   Acute on chronic diastolic CHF (congestive heart failure) (HCC)   History of stroke   Venous stasis ulcers of both lower extremities (HCC)   ESRD (end stage renal disease) (HCC)   Atrial fibrillation with rapid ventricular response ([place])   Pulmonary hypertension, unspecified ([place])   Palliative care by specialist   Goals of care, counseling/discussion   General weakness   #1 sepsis secondary to left lower extremity purulent cellulitis/infected nonhealing wounds due to PAD and possible Proteus UTI, POA Patient pancultured.  Blood cultures with no growth to date.  Urine  cultures with 10,000 colonies of Proteus Mirabella's.  2D echo which was done did not show a large vegetation.  Patient noted to have had an abdominal aortogram on 05/30/2020 that showed heavily calcified bilateral proximal arteries with flush occlusion of bilateral superficial femoral artery.  CT left lower extremity with open ulceration with cellulitis and myofascitis. -Per vascular surgery amputation will be next consideration if pain was unbearable.  Palliative care following and helping manage patient's current pain as well as goals of care. -Was on vancomycin and meropenem and has been transitioned to Zyvox. -Norvasc discontinued secondary to edema. -Continue current IV antibiotics.  Follow.  2.  Acute on chronic hypoxemic respiratory failure secondary to acute on chronic diastolic CHF exacerbation 2D echo done with EF of 55 to 60%, right wall motion abnormalities, RVSP 73.4, severe LAE, moderate RAE, moderate to severe MVR, severe mitral annular calcification and severe TR.  Patient was on IV Lasix and transition to oral torsemide which she is tolerating.  Urine output recorded of 600 cc over the past 24 hours.  Clinical improvement.  Cardiology following.  3.  Moderate to severe mitral valve regurgitation/severe tricuspid valvular regurgitation Per cardiology not a candidate for surgical intervention at this time.  Continue diuretics and cardiac medications.  Outpatient follow-up with cardiology.  4.  History of severe AI status post AVR with prosthetic valve Stable.  Per cardiology.  5.  A. fib with RVR Currently rate controlled on IV Lopressor.  Patient has been refusing oral medications.  Once tolerating oral medication we will transition back to oral Lopressor per cardiology recommendations.  Currently on heparin drip.  Could likely transition to Coumadin tomorrow.  6.  History of coronary artery disease status post stents Continue beta-blocker, statin, anticoagulation.  Per  cardiology.  7.  History of embolic CVA with residual expressive aphasia CT head done during this hospitalization showed prior infarct at the junction of the left temporoparietal and occipital lobes.  No acute infarct noted.  Patient being followed by speech therapy.  Continue risk factor modification, statin, beta-blocker, on IV heparin likely transition to Coumadin in the next 24 to 48 hours.  Supportive care.  8.  Abdominal pain CT abdomen and pelvis without contrast revealed new body wall edema.  Recent arteriogram does not look like ischemic bowel.  Improving.  9.  History of end-stage renal disease status post renal transplant now with chronic kidney disease stage IV Stable.  Continue low-dose prednisone and tacrolimus.  Diet liberated due to poor oral intake.  Outpatient follow-up with nephrology.  10.  Uncontrolled type 2 diabetes mellitus Sliding scale insulin.  11.  Hypertension Stable.  Continue metoprolol, torsemide.  12.  Left upper extremity swelling Left upper extremity Dopplers negative for DVT.  Outpatient follow-up.  13.  Hyperlipidemia Continue statin.  14.  Goals of care Patient with multiple comorbidities.  Patient with a poor prognosis.  Patient full code.  Palliative care following.  15.  Inadequate oral intake Continue nutritional supplementation.  DVT prophylaxis: heparin Code Status: Full Family Communication: Updated patient.  No family at bedside. Disposition:   Status is: Inpatient    Dispo: The patient is from: Home              Anticipated d/c is to: SNF for palliative care following              Anticipated d/c date is: To be determined.              Patient currently being diuresed due to CHF exacerbation, has arterial ulcers, not medicaaly stable for discharge.       Consultants:   Wound care nurse Domenic Moras, FNP 06/03/2020  Cardiology: Dr. Oval Linsey 06/03/2020  Palliative care: Dr. Domingo Cocking 06/04/2020    Procedures:   CT  left tib-fib 06/05/2020  CT abdomen and pelvis 06/03/2020  CT head 06/03/2020  Chest x-ray 06/03/2020  2D echo 06/03/2020  Left upper extremity Dopplers 06/07/2020  Antimicrobials:   Oral Zyvox 06/06/2020>>> 06/10/2020  Oral Flagyl 06/04/2020>>>> 06/05/2020  IV Ancef 06/05/2020 x1 dose  IV cefepime 06/03/2020 x 1 dose  IV Rocephin 06/04/2020>>>> 06/05/2020  IV Zyvox 06/04/2020 >>> 06/05/2020, 06/10/2020>>>  IV Merrem 06/03/2020>>> 06/04/2020  IV Zosyn 06/03/2020 x 1 dose  IV vancomycin 06/03/2020 x 1 dose   Subjective: Patient alert.  Denies any chest pain.  Denies any significant shortness of breath.  Some expressive aphasia noted.  Feeling better.  Objective: Vitals:   06/11/20 0000 06/11/20 0023 06/11/20 0401 06/11/20 0500  BP: 136/69 136/69 131/77   Pulse: 91 91 90   Resp: 18  15   Temp: 98.8 F (37.1 C) 98.9 F (37.2 C) 98.1 F (36.7 C)   TempSrc: Oral Oral Oral   SpO2: 95% 95% 97%   Weight:    81.5 kg  Height:        Intake/Output Summary (Last 24 hours) at 06/11/2020 1029 Last data filed at 06/11/2020 0936 Gross per 24 hour  Intake 604.95 ml  Output 1000 ml  Net -395.05 ml   Filed Weights   06/10/20 0500 06/10/20 0559 06/11/20 0500  Weight: 82.3 kg 81.8 kg 81.5 kg    Examination:  General exam: Appears calm and comfortable  Respiratory system: Clear to auscultation. Respiratory effort normal. Cardiovascular system: Irregularly irregular.  No JVD, no murmurs rubs or gallops.  No lower extremity edema. Gastrointestinal system: Abdomen is nondistended, soft and nontender. No organomegaly or masses felt. Normal bowel sounds heard. Central nervous system: Alert and oriented. No focal neurological deficits. Extremities: Left lower extremity bandaged.  Left upper extremity swelling.  Symmetric 5 x 5 power. Skin: No rashes, lesions or ulcers Psychiatry: Judgement and insight appear normal. Mood & affect appropriate.     Data Reviewed: I have  personally reviewed following labs and imaging studies  CBC: Recent Labs  Lab 06/06/20 0440 06/07/20 0450 06/09/20 0418 06/10/20 0414 06/11/20 0453  WBC 13.4* 13.3* 12.1* 9.7 11.2*  NEUTROABS  --   --  10.2* 8.1*  --   HGB 11.6* 11.1* 11.1* 11.3* 11.0*  HCT 39.2 37.5 36.7 37.7 36.5  MCV 80.8 79.6* 77.9* 78.2* 79.0*  PLT 186 193 219 220 409    Basic Metabolic Panel: Recent Labs  Lab 06/07/20 0450 06/08/20 0350 06/09/20 0418 06/10/20 0414 06/11/20 0453  NA 141 141 137 139 138  K 2.9* 3.5 4.0 3.8 3.7  CL 96* 97* 95* 96* 97*  CO2 33* 29 31 30 29   GLUCOSE 81 108* 276* 168* 144*  BUN 41* 39* 43* 46* 42*  CREATININE 2.48* 2.48* 2.67* 2.66* 2.24*  CALCIUM 9.4 9.5 9.6 9.9 9.8  MG  --  2.1 2.0 1.9 2.0  PHOS  --   --  3.7 2.8 2.9    GFR: Estimated Creatinine Clearance: 20.7 mL/min (A) (by C-G formula based on SCr of 2.24 mg/dL (H)).  Liver Function Tests: Recent Labs  Lab 06/08/20 0350 06/09/20 0418 06/10/20 0414 06/11/20 0453  AST 54*  --   --   --   ALT 18  --   --   --   ALKPHOS 672*  --   --   --   BILITOT 1.0  --   --   --   PROT 6.4*  --   --   --   ALBUMIN 2.4* 2.3* 2.4* 2.3*    CBG: Recent Labs  Lab 06/10/20 0814 06/10/20 1120 06/10/20 1704 06/10/20 2117 06/11/20 0725  GLUCAP 161* 130* 149* 104* 166*     Recent Results (from the past 240 hour(s))  Culture, blood (Routine x 2)     Status: None   Collection Time: 06/03/20  4:07 AM   Specimen: BLOOD  Result Value Ref Range Status   Specimen Description   Final    BLOOD RIGHT ANTECUBITAL Performed at [facility], [place] [address], [place], [place] [address]    Special Requests   Final    BOTTLES DRAWN AEROBIC AND ANAEROBIC Blood Culture adequate volume Performed at [place] [address] [place], [place] [address]    Culture   Final    NO GROWTH 5 DAYS Performed at [facility], [place] [address], [place], [place] [address]    Report Status  06/08/2020 FINAL  Final  Culture, blood (Routine x 2)     Status: None   Collection Time: 06/03/20  4:07 AM   Specimen: BLOOD RIGHT HAND  Result Value Ref Range Status   Specimen Description   Final    BLOOD RIGHT HAND Performed at [facility] [address] [place], [place]  [address]    Special Requests   Final    BOTTLES DRAWN AEROBIC AND ANAEROBIC Blood Culture results may not be optimal due to an inadequate volume of blood received in culture bottles Performed at [name], [place] [address], [place], [place] [address]    Culture   Final    NO GROWTH 5 DAYS Performed at [facility], [place] [address] [place], [place] [address]    Report Status 06/08/2020 FINAL  Final  Urine culture     Status: Abnormal   Collection Time: 06/03/20  4:29 AM   Specimen: In/Out Cath Urine  Result Value Ref Range Status   Specimen Description   Final    IN/OUT CATH URINE Performed at [place] [address] [place], [place] [address]    Special Requests   Final    NONE Performed at [facility], [place] [address], [place], [place] [address]    Culture 10,000 COLONIES/mL PROTEUS MIRABILIS (A)  Final   Report Status 06/05/2020 FINAL  Final   Organism ID, Bacteria PROTEUS MIRABILIS (A)  Final      Susceptibility   Proteus mirabilis - MIC*    AMPICILLIN <=2 SENSITIVE Sensitive     CEFAZOLIN <=4 SENSITIVE Sensitive     CEFEPIME <=0.12 SENSITIVE Sensitive     CEFTRIAXONE <=0.25 SENSITIVE Sensitive     CIPROFLOXACIN <=0.25 SENSITIVE Sensitive     GENTAMICIN <=1 SENSITIVE Sensitive     IMIPENEM 8 INTERMEDIATE Intermediate     NITROFURANTOIN 128 RESISTANT Resistant     TRIMETH/SULFA <=20 SENSITIVE Sensitive     AMPICILLIN/SULBACTAM <=2 SENSITIVE Sensitive     PIP/TAZO <=4 SENSITIVE Sensitive     * 10,000 COLONIES/mL PROTEUS MIRABILIS  Resp Panel by RT PCR (RSV, Flu A&B, Covid) - Nasopharyngeal Swab     Status:  None   Collection Time: 06/03/20  8:14 AM   Specimen: Nasopharyngeal Swab  Result Value Ref Range Status   SARS Coronavirus 2 by RT PCR NEGATIVE NEGATIVE Final    Comment: (NOTE) SARS-CoV-2 target nucleic acids are NOT DETECTED.  The SARS-CoV-2 RNA is generally detectable in upper respiratoy specimens during the acute phase of infection. The lowest concentration of SARS-CoV-2 viral copies this assay can detect is 131 copies/mL. A negative result does not preclude SARS-Cov-2 infection and should not be used as the sole basis for treatment or other patient management decisions. A negative result may occur with  improper specimen collection/handling, submission of specimen other than nasopharyngeal swab, presence of viral mutation(s) within the areas targeted by this assay, and inadequate number of viral copies (<131 copies/mL). A negative result must be combined with clinical observations, patient history, and epidemiological information. The expected result is Negative.  Fact Sheet for Patients:  PinkCheek.be  Fact Sheet for Healthcare Providers:  GravelBags.it  This test is no t yet approved or cleared by the [place] FDA and  has been authorized for detection and/or diagnosis of SARS-CoV-2 by FDA under an Emergency Use Authorization (EUA). This EUA will remain  in effect (meaning this test can be used) for the duration of the COVID-19 declaration under Section 564(b)(1) of the Act, 21 U.S.C. section 360bbb-3(b)(1), unless the authorization is terminated or revoked sooner.     Influenza A by PCR NEGATIVE NEGATIVE Final   Influenza B by PCR NEGATIVE NEGATIVE Final    Comment: (NOTE) The Xpert Xpress SARS-CoV-2/FLU/RSV assay is intended as an aid in  the diagnosis of  influenza from Nasopharyngeal swab specimens and  should not be used as a sole basis for treatment. Nasal washings and  aspirates are unacceptable for  Xpert Xpress SARS-CoV-2/FLU/RSV  testing.  Fact Sheet for Patients: PinkCheek.be  Fact Sheet for Healthcare Providers: GravelBags.it  This test is not yet approved or cleared by the [place] FDA and  has been authorized for detection and/or diagnosis of SARS-CoV-2 by  FDA under an Emergency Use Authorization (EUA). This EUA will remain  in effect (meaning this test can be used) for the duration of the  Covid-19 declaration under Section 564(b)(1) of the Act, 21  U.S.C. section 360bbb-3(b)(1), unless the authorization is  terminated or revoked.    Respiratory Syncytial Virus by PCR NEGATIVE NEGATIVE Final    Comment: (NOTE) Fact Sheet for Patients: PinkCheek.be  Fact Sheet for Healthcare Providers: GravelBags.it  This test is not yet approved or cleared by the [place] FDA and  has been authorized for detection and/or diagnosis of SARS-CoV-2 by  FDA under an Emergency Use Authorization (EUA). This EUA will remain  in effect (meaning this test can be used) for the duration of the  COVID-19 declaration under Section 564(b)(1) of the Act, 21 U.S.C.  section 360bbb-3(b)(1), unless the authorization is terminated or  revoked. Performed at [facility], [place] [address], [place], [place] [address]   MRSA PCR Screening     Status: Abnormal   Collection Time: 06/03/20  2:00 PM   Specimen: Nasal Mucosa; Nasopharyngeal  Result Value Ref Range Status   MRSA by PCR POSITIVE (A) NEGATIVE Final    Comment:        The GeneXpert MRSA Assay (FDA approved for NASAL specimens only), is one component of a comprehensive MRSA colonization surveillance program. It is not intended to diagnose MRSA infection nor to guide or monitor treatment for MRSA infections. RESULT CALLED TO, READ BACK BY AND VERIFIED WITH: HEAVNER,L. RN @1848  06/03/20  BILLINGSLEY,L Performed at [name], [place] [address], [place], [place] [address]          Radiology Studies: No results found.      Scheduled Meds: . acetaminophen  650 mg Oral Q6H  . collagenase   Topical Daily  . feeding supplement  237 mL Oral TID BM  . fentaNYL  1 patch Transdermal Q72H  . insulin aspart  0-5 Units Subcutaneous QHS  . insulin aspart  0-6 Units Subcutaneous TID WC  . insulin aspart  2 Units Subcutaneous TID WC  . levothyroxine  25 mcg Intravenous Daily  . metoprolol tartrate  2.5 mg Intravenous Q6H  . predniSONE  5 mg Oral QAC breakfast  . sodium chloride flush  3 mL Intravenous Q12H  . tacrolimus  3 mg Oral BID  . torsemide  100 mg Oral Daily   Continuous Infusions: . heparin 1,800 Units/hr (06/11/20 0326)  . linezolid (ZYVOX) IV 600 mg (06/11/20 0513)     LOS: 8 days    Time spent: 40 minutes    Irine Seal, MD [facility]   To contact the attending provider between 7A-7P or the covering provider during after hours 7P-7A, please log into the web site [email or web address] and access using universal [place] password for that web site. If you do not have the password, please call the hospital operator.  06/11/2020, 10:29 AM

## 2020-06-11 NOTE — Progress Notes (Addendum)
Progress Note  Patient Name: [name] Date of Encounter: 06/11/2020  [facility] Cardiologist: [name], MD   Subjective   Afib rates in the 90s. Creatinine and BP stable. Patient is more awake this morning. No chest pain.   Inpatient Medications    Scheduled Meds: . acetaminophen  650 mg Oral Q6H  . collagenase   Topical Daily  . feeding supplement  237 mL Oral TID BM  . fentaNYL  1 patch Transdermal Q72H  . insulin aspart  0-5 Units Subcutaneous QHS  . insulin aspart  0-6 Units Subcutaneous TID WC  . insulin aspart  2 Units Subcutaneous TID WC  . levothyroxine  25 mcg Intravenous Daily  . metoprolol tartrate  2.5 mg Intravenous Q6H  . predniSONE  5 mg Oral QAC breakfast  . sodium chloride flush  3 mL Intravenous Q12H  . tacrolimus  3 mg Oral BID  . torsemide  100 mg Oral Daily   Continuous Infusions: . heparin 1,800 Units/hr (06/11/20 0326)  . linezolid (ZYVOX) IV 600 mg (06/11/20 0513)   PRN Meds: HYDROmorphone (DILAUDID) injection, oxyCODONE, polyethylene glycol   Vital Signs    Vitals:   06/11/20 0000 06/11/20 0023 06/11/20 0401 06/11/20 0500  BP: 136/69 136/69 131/77   Pulse: 91 91 90   Resp: 18  15   Temp: 98.8 F (37.1 C) 98.9 F (37.2 C) 98.1 F (36.7 C)   TempSrc: Oral Oral Oral   SpO2: 95% 95% 97%   Weight:    81.5 kg  Height:        Intake/Output Summary (Last 24 hours) at 06/11/2020 0732 Last data filed at 06/11/2020 0030 Gross per 24 hour  Intake 454.95 ml  Output 600 ml  Net -145.05 ml   Last 3 Weights 06/11/2020 06/10/2020 06/10/2020  Weight (lbs) 179 lb 10.8 oz 180 lb 5.4 oz 181 lb 7 oz  Weight (kg) 81.5 kg 81.8 kg 82.3 kg      Telemetry    Afib HR 80-90s - Personally Reviewed  ECG    No new - Personally Reviewed  Physical Exam   GEN: No acute distress.   Neck: No JVD Cardiac: Irreg Irreg, no murmurs, rubs, or gallops.  Respiratory: Clear to auscultation bilaterally. GI: Soft, nontender, non-distended    MS: No edema; No deformity. Neuro:  Nonfocal  Psych: Normal affect   Labs    High Sensitivity Troponin:   Recent Labs  Lab 06/03/20 0619 06/03/20 0815 06/03/20 1009  TROPONINIHS 161* 151* 174*      Chemistry Recent Labs  Lab 06/08/20 0350 06/08/20 0350 06/09/20 0418 06/10/20 0414 06/11/20 0453  NA 141   < > 137 139 138  K 3.5   < > 4.0 3.8 3.7  CL 97*   < > 95* 96* 97*  CO2 29   < > 31 30 29   GLUCOSE 108*   < > 276* 168* 144*  BUN 39*   < > 43* 46* 42*  CREATININE 2.48*   < > 2.67* 2.66* 2.24*  CALCIUM 9.5   < > 9.6 9.9 9.8  PROT 6.4*  --   --   --   --   ALBUMIN 2.4*   < > 2.3* 2.4* 2.3*  AST 54*  --   --   --   --   ALT 18  --   --   --   --   ALKPHOS 672*  --   --   --   --  BILITOT 1.0  --   --   --   --   GFRNONAA 20*   < > 18* 18* 22*  ANIONGAP 15   < > 11 13 12    < > = values in this interval not displayed.     Hematology Recent Labs  Lab 06/09/20 0418 06/10/20 0414 06/11/20 0453  WBC 12.1* 9.7 11.2*  RBC 4.71 4.82 4.62  HGB 11.1* 11.3* 11.0*  HCT 36.7 37.7 36.5  MCV 77.9* 78.2* 79.0*  MCH 23.6* 23.4* 23.8*  MCHC 30.2 30.0 30.1  RDW 20.0* 20.2* 20.2*  PLT 219 220 219    BNPNo results for input(s): BNP, PROBNP in the last 168 hours.   DDimer No results for input(s): DDIMER in the last 168 hours.   Radiology    No results found.  Cardiac Studies   Echo 06/03/20: 1. Left ventricular ejection fraction, by estimation, is 55 to 60%. The  left ventricle has normal function. The left ventricle demonstrates  regional wall motion abnormalities (see scoring diagram/findings for  description). There is mild left ventricular  hypertrophy. Left ventricular diastolic parameters are indeterminate.  2. Right ventricular systolic function is moderately reduced. The right  ventricular size is moderately enlarged. There is severely elevated  pulmonary artery systolic pressure. The estimated right ventricular  systolic pressure is 33.3 mmHg.  3.  Left atrial size was severely dilated.  4. Right atrial size was moderately dilated.  5. Suspect small flail segment on anterior mitral leaflet causing very  eccentric posteriorly directed mitral valve regurgitation that appears at  least moderate-severe. From short axis view may involve A3 scallop.. The  mitral valve is abnormal. Moderate  to severe mitral valve regurgitation. Severe mitral annular calcification.  6. Tricuspid valve regurgitation is severe.  7. The aortic valve has been repaired/replaced. Aortic valve  regurgitation is not visualized. There is a 23 mm Edwards bovine valve  present in the aortic position. Procedure Date: 04/07/2013. Echo findings  are consistent with normal structure and  function of the aortic valve prosthesis. Aortic valve mean gradient  measures 18.2 mmHg.  8. The inferior vena cava is dilated in size with <50% respiratory  variability, suggesting right atrial pressure of 15 mmHg.   Patient Profile     74 y.o. female with CAD s/p PCI, AS s/p bioprosthetic AVR, ESRD status post renal transplant, CKD 4, severe pulmonary hypertensin, embolic stroke with expressive aphasia, diabetes, hypertension, hyperlipidemia, OSA, COPD, atrial fibrillation status post Maze procedure, chronic diastolic heart failure, PAD, and lung adenocarcinoma that is untreated admitted with lower extremity ulcers, sepsis secondary to cellulitis, and atrial fibrillation with rapid ventricular response.   Assessment & Plan    Aifb RVR  - BB refused yesterday and afib rates up. Patient was taking metoprolol 25mg  BID so she was transitioned to IV metoprolol. Can go back to PO if patient once taking medications - warfarin on hold and on IV heparin>>plan to resume warfarin once there is it's clear she won't need procedures   Sepsis/cellulitis/PAD with non-healing wounds - CT LLE showed diffuse myofascitis - abx per IM - wound care following  Acute on chronic diastolic heart  failure/severe TR/mild to mod MR - flail mitral valve leaflet with moderate to severe TR - given malignancy not likely a candidate for Mitraclip or open surgery - Diuresis with Torsemide 100mg  daily - creatinine stable - had some mild lower leg edema and amlodipine was d/c'd. Volume status stable. Continue Torsemide  HTN - amlodipine d/c'd  for lower leg edema - metoprolol  - BP today 131/77  CAD s/p PCI/PAD - CT showed diffuse calcification - not on ASA due to warfarin  S/p AVR - stable per recent echo  HLD - LDL 32 - not on statin  CKD stage IV s/p renal transplant - creatinine stable 2.48>2.67>2.66>2.24  Goals of care - palliative care following, plan to continue discussion with family  For questions or updates, please contact [facility] Please consult [email or web address] for contact info under     Signed, Cadence Ninfa Meeker, PA-C  06/11/2020, 7:32 AM    The patient was seen, examined and discussed with Cadence Ninfa Meeker, PA-C   and I agree with the above.   The patient remains in atrial fibrillation with heart rates in 90s, she was transitioned to IV metoprolol at 10 mg every 6 hours, once she is willing to take p.o. please transition to metoprolol 25 mg p.o. twice daily.  We will sign off, please call us with any questions.  Ena Dawley, MD 06/11/2020   Ena Dawley, MD 06/11/2020

## 2020-06-11 NOTE — Evaluation (Signed)
Clinical/Bedside Swallow Evaluation Patient Details  Name: [name] MRN: [id number] Date of Birth: [birth year]  Today's Date: 06/11/2020 Time: SLP Start Time (ACUTE ONLY): 1702 SLP Stop Time (ACUTE ONLY): 1722 SLP Time Calculation (min) (ACUTE ONLY): 20 min  Past Medical History:  Past Medical History:  Diagnosis Date  . Acute CHF (congestive heart failure) ([place]) 12/2018  . Adenocarcinoma of lung, stage 1, right ([place]) 11/25/2017  . Anemia, iron deficiency    of chronic disease  . Aortic stenosis    a. Severe AS by echo 11/2012.  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 Asystole ([place])    a. During ENT surgery 2005: developed marked asystole requiring CPR, felt due to vagal reaction (cath nonobst dz).  . Carotid artery disease ([place])    a. Carotid Dopplers performed in August 2013 showed 40-59% left stenosis and 0-39% right; f/u recommended in 2 years.   . Cerebrovascular accident [facility]) 2009   a. LMCA infarct felt embolic 1884, maintained on chronic coumadin.; denies residual on 04/05/2013  . Cholelithiasis   . Chronic Persistent Atrial Fibrillation 12/31/2008   Qualifier: Diagnosis of  By: [name]    . Coronary artery disease 05/2002   a. Ant MI 2003 s/p PTCA/stent to RCA.   . Diverticulosis of colon   . Esophagitis, reflux   . ESRD (end stage renal disease) ([place])    a. Mass on L kidney per pt s/p nephrectomy - pt states not cancer - [facility] notes indicate ESRD due to HTN/DM - was previously on HD. b. Kidney transplant 02/2011.  Marland Kitchen GERD (gastroesophageal reflux disease)   . Gout   . Hearing loss   . Helicobacter pylori (H. pylori) infection    hx of  . Hemorrhoids   . Hx of colonic polyps    adenomatous  . Hyperlipidemia   . Hypertension   . Lung nodule seen on imaging study 04/07/2013   1.0 cm ground glass opacity RUL  . Myocardial infarction ([place]) 2003  . Pericardial effusion    a. Small by echo 11/2011.  . S/P aortic valve replacement with  bioprosthetic valve and maze procedure 04/12/2013   40mm [facility] Ease bovine pericardial tissue valve   . S/P Maze operation for atrial fibrillation 04/12/2013   Complete bilateral atrial lesion set using bipolar radiofrequency and cryothermy ablation with clipping of LA appendage  . Sleep apnea    Pt says testing was positive, intolerant of CPAP.  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  . Stroke ([place])   . Type II diabetes mellitus ([place])    Past Surgical History:  Past Surgical History:  Procedure Laterality Date  . ABDOMINAL AORTOGRAM W/LOWER EXTREMITY Bilateral 05/30/2020   Procedure: ABDOMINAL AORTOGRAM W/LOWER EXTREMITY;  Surgeon: Elam Dutch, MD;  Location: [place] [facility];  Service: Cardiovascular;  Laterality: Bilateral;  . AORTIC VALVE REPLACEMENT N/A 04/12/2013   Procedure: AORTIC VALVE REPLACEMENT (AVR);  Surgeon: Rexene Alberts, MD;  Location: [place];  Service: Open Heart Surgery;  Laterality: N/A;  . ARTERIOVENOUS GRAFT PLACEMENT Left   . ARTERIOVENOUS GRAFT PLACEMENT Left    "I've had 2 on my left; had one removed" (04/05/2013)   . ARTERY EXPLORATION Right 04/11/2013   Procedure: ARTERY EXPLORATION;  Surgeon: Rexene Alberts, MD;  Location: [place];  Service: Open Heart Surgery;  Laterality: Right;  Right carotid artery exploration  . AV FISTULA PLACEMENT Right   . AV FISTULA REPAIR Right    "took it out" ((/18/2014)  . CARDIOVERSION  05/29/2012   Procedure: CARDIOVERSION;  Surgeon: Lelon Perla, MD;  Location: [facility];  Service: Cardiovascular;  Laterality: N/A;  . CHOLECYSTECTOMY  2009   with hernia removal  . CORONARY ANGIOPLASTY WITH STENT PLACEMENT Right    coronary artery  . INSERTION OF DIALYSIS CATHETER Bilateral    "over the years; took them both out" (04/05/2013)  . INTRAOPERATIVE  TRANSESOPHAGEAL ECHOCARDIOGRAM N/A 04/11/2013   Procedure: INTRAOPERATIVE TRANSESOPHAGEAL ECHOCARDIOGRAM;  Surgeon: Rexene Alberts, MD;  Location: [place];  Service: Open Heart Surgery;  Laterality: N/A;  . INTRAOPERATIVE TRANSESOPHAGEAL ECHOCARDIOGRAM N/A 04/12/2013   Procedure: INTRAOPERATIVE TRANSESOPHAGEAL ECHOCARDIOGRAM;  Surgeon: Rexene Alberts, MD;  Location: [place];  Service: Open Heart Surgery;  Laterality: N/A;  . KIDNEY TRANSPLANT  03/16/11  . LEFT AND RIGHT HEART CATHETERIZATION WITH CORONARY ANGIOGRAM N/A 04/06/2013   Procedure: LEFT AND RIGHT HEART CATHETERIZATION WITH CORONARY ANGIOGRAM;  Surgeon: Blane Ohara, MD;  Location: [facility] CATH LAB;  Service: Cardiovascular;  Laterality: N/A;  . MAZE N/A 04/12/2013   Procedure: MAZE;  Surgeon: Rexene Alberts, MD;  Location: [place];  Service: Open Heart Surgery;  Laterality: N/A;  . NASAL RECONSTRUCTION WITH SEPTAL REPAIR     "took it out" (04/05/2013)  . NEPHRECTOMY Left 2010   no CA on bx  . TONSILLECTOMY    . TOTAL ABDOMINAL HYSTERECTOMY    . TUBAL LIGATION     HPI:  Pt is a 74 y.o. female with PMH CAD s/p stent, severe AS s/p AVR, HTN, HLD, OSA, COPD, HFpEF, AFib s/p maze, DM2, ESRD s/p renal transplant, CKD4, lung cancer, embolic CVA with residual aphasia (2009), BLE nonhealing wounds, esophagitis/reflux, GERD, gout, hearing loss. She was admitted 06/03/20 with fever and received bedside swallow evaluation on that date with "no obvious oral issues, and no overt s/s aspiration observed on any texture." A regular texture diet with thin liquids was initiated at that time with no further indication of need for SLP intervention. CXR 11/16 revealed CHF. CT head was negative for acute changes. SLP reconsulted on 11/23 with comment from RN of "Pt has been refusing oral meds and has not eaten. Tried to get pt to eat but is holding food and liquids in her mouth."  Palliative care has been consulted to assist with goals of care.    Assessment / Plan /  Recommendation Clinical Impression  Pt was seen for bedside swallow evaluation and she reported that she has been having some difficulty swallowing due to xerostomia. Oral mechanism exam was limited since pt's residual aphasia affected her ability to consistently follow commands; however, oral motor strength and ROM appeared grossly WFL and dentition was adequate. She tolerated all solids and liquids without signs or symptoms of oropharyngeal dysphagia. A regular texture diet with thin liquids is recommended at this time. Considering RN's reports of pt holding boluses and having difficulty swallowing, SLP will follow pt briefly to ensure diet tolerance.  SLP Visit Diagnosis: Dysphagia, unspecified (R13.10)    Aspiration Risk  Mild aspiration risk    Diet Recommendation Regular;Thin liquid   Liquid Administration via: Cup;Straw Medication Administration: Whole meds with puree Supervision: Patient able to self feed Compensations: Slow rate;Small sips/bites;Minimize environmental distractions Postural Changes: Seated upright at 90 degrees    Other  Recommendations Oral Care Recommendations: Oral care  BID   Follow up Recommendations None      Frequency and Duration min 1 x/week  1 week       Prognosis Prognosis for Safe Diet Advancement: Good Barriers to Reach Goals: Language deficits      Swallow Study   General Date of Onset: 06/03/20 HPI: Pt is a 74 y.o. female with PMH CAD s/p stent, severe AS s/p AVR, HTN, HLD, OSA, COPD, HFpEF, AFib s/p maze, DM2, ESRD s/p renal transplant, CKD4, lung cancer, embolic CVA with residual aphasia (2009), BLE nonhealing wounds, esophagitis/reflux, GERD, gout, hearing loss. She was admitted 06/03/20 with fever and received bedside swallow evaluation on that date with "no obvious oral issues, and no overt s/s aspiration observed on any texture." A regular texture diet with thin liquids was initiated at that time with no further indication of need for SLP  intervention. CXR 11/16 revealed CHF. CT head was negative for acute changes. SLP reconsulted on 11/23 with comment from RN of "Pt has been refusing oral meds and has not eaten. Tried to get pt to eat but is holding food and liquids in her mouth."  Palliative care has been consulted to assist with goals of care.  Type of Study: Bedside Swallow Evaluation Previous Swallow Assessment: See HPI Diet Prior to this Study: Regular;Thin liquids Temperature Spikes Noted: No Respiratory Status: Nasal cannula History of Recent Intubation: No Behavior/Cognition: Alert;Cooperative;Pleasant mood Oral Cavity Assessment: Within Functional Limits Oral Care Completed by SLP: No Oral Cavity - Dentition: Adequate natural dentition Vision: Functional for self-feeding Self-Feeding Abilities: Needs assist Patient Positioning: Upright in bed;Postural control adequate for testing Baseline Vocal Quality: Normal Volitional Cough: Cognitively unable to elicit Volitional Swallow: Unable to elicit    Oral/Motor/Sensory Function Overall Oral Motor/Sensory Function: Within functional limits   Ice Chips Ice chips: Within functional limits Presentation: Spoon   Thin Liquid Thin Liquid: Within functional limits Presentation: Straw    Nectar Thick Nectar Thick Liquid: Not tested   Honey Thick Honey Thick Liquid: Not tested   Puree Puree: Within functional limits Presentation: Spoon (very small bolus)   Solid     Solid: Within functional limits Presentation: [place] I. Hardin Negus, [place], [place] Office number [phone] Pager [phone]  Horton Marshall 06/11/2020,5:25 PM

## 2020-06-11 NOTE — Progress Notes (Addendum)
[place] for heparin Indication: hx bioprosthetic AVR, stroke, afib  Allergies  Allergen Reactions  . Ibuprofen Nausea And Vomiting  . Sulfamethoxazole-Trimethoprim Itching, Swelling and Rash    Swelling of the face  . Sulfonamide Derivatives Itching, Swelling and Rash    Swelling of the face  . Tape Rash    Paper tape is ok  . Tramadol Nausea And Vomiting  . Doxycycline Nausea Only  . Hydrocil [Psyllium] Nausea And Vomiting  . Bactrim Itching, Swelling and Rash  . Red Dye Itching and Rash   Patient Measurements: Height: 5\' 1"  (154.9 cm) Weight: 81.8 kg (180 lb 5.4 oz) IBW/kg (Calculated) : 47.8 Heparin Dosing Weight: 66.4 kg  Vital Signs: Temp: 98.2 F (36.8 C) (11/23 1942) Temp Source: Axillary (11/23 1942) BP: 132/73 (11/23 1942) Pulse Rate: 87 (11/23 1942)  Labs: Recent Labs    06/08/20 0350 06/08/20 0404 06/09/20 0418 06/09/20 0418 06/10/20 0414 06/10/20 1322 06/10/20 2300  HGB  --   --  11.1*  --  11.3*  --   --   HCT  --   --  36.7  --  37.7  --   --   PLT  --   --  219  --  220  --   --   HEPARINUNFRC  --    < > 0.41   < > <0.10* 0.20* 0.48  CREATININE 2.48*  --  2.67*  --  2.66*  --   --    < > = values in this interval not displayed.    Estimated Creatinine Clearance: 17.4 mL/min (A) (by C-G formula based on SCr of 2.66 mg/dL (H)).   Medications:  - on warfarin PTA  Assessment: Patient is a 74 y.o F with hx bioprosthetic AVR, stroke, and afib on warfarin PTA presented to the ED on 11/16 with fever. Pharmacy is consulted to transition to heparin drip on admission.  Today, 06/11/2020:  Heparin level 0.48- therapeutic on 1800 units/hr   CBC stable  SCr 2.66, CrCl ~ 17 ml/min    No bleeding issues noted per nursing  Per RN, no infusion issues since IV site replaced this AM  Goal of Therapy:  Heparin level 0.3-0.7 units/ml Monitor platelets by anticoagulation protocol: Yes   Plan:   Continue  heparin infusion at 1800 units/hr  Daily heparin level, CBC  Monitor closely for s/sx of bleeding  [name], PharmD, BCPS Clinical Pharmacist  06/11/2020, 12:09 AM  Addendum: Repeat daily heparin level 0.55- remains therapeutic on 1800 units/hr CBC stable Continue heparin at current rate and monitor daily labs.  [name], PharmD, BCPS 06/11/2020@5 :24 AM

## 2020-06-12 DIAGNOSIS — N390 Urinary tract infection, site not specified: Secondary | ICD-10-CM | POA: Diagnosis not present

## 2020-06-12 DIAGNOSIS — I4891 Unspecified atrial fibrillation: Secondary | ICD-10-CM | POA: Diagnosis not present

## 2020-06-12 DIAGNOSIS — R531 Weakness: Secondary | ICD-10-CM | POA: Diagnosis not present

## 2020-06-12 DIAGNOSIS — Z7189 Other specified counseling: Secondary | ICD-10-CM | POA: Diagnosis not present

## 2020-06-12 DIAGNOSIS — A419 Sepsis, unspecified organism: Secondary | ICD-10-CM | POA: Diagnosis not present

## 2020-06-12 DIAGNOSIS — I5033 Acute on chronic diastolic (congestive) heart failure: Secondary | ICD-10-CM | POA: Diagnosis not present

## 2020-06-12 LAB — CBC
HCT: 33.7 % — ABNORMAL LOW (ref 36.0–46.0)
Hemoglobin: 10.5 g/dL — ABNORMAL LOW (ref 12.0–15.0)
MCH: 24.1 pg — ABNORMAL LOW (ref 26.0–34.0)
MCHC: 31.2 g/dL (ref 30.0–36.0)
MCV: 77.5 fL — ABNORMAL LOW (ref 80.0–100.0)
Platelets: 212 10*3/uL (ref 150–400)
RBC: 4.35 MIL/uL (ref 3.87–5.11)
RDW: 19.5 % — ABNORMAL HIGH (ref 11.5–15.5)
WBC: 11.7 10*3/uL — ABNORMAL HIGH (ref 4.0–10.5)
nRBC: 0 % (ref 0.0–0.2)

## 2020-06-12 LAB — RENAL FUNCTION PANEL
Albumin: 2.3 g/dL — ABNORMAL LOW (ref 3.5–5.0)
Anion gap: 9 (ref 5–15)
BUN: 50 mg/dL — ABNORMAL HIGH (ref 8–23)
CO2: 32 mmol/L (ref 22–32)
Calcium: 9.7 mg/dL (ref 8.9–10.3)
Chloride: 96 mmol/L — ABNORMAL LOW (ref 98–111)
Creatinine, Ser: 2.52 mg/dL — ABNORMAL HIGH (ref 0.44–1.00)
GFR, Estimated: 19 mL/min — ABNORMAL LOW (ref >60)
Glucose, Bld: 247 mg/dL — ABNORMAL HIGH (ref 70–99)
Phosphorus: 3.2 mg/dL (ref 2.5–4.6)
Potassium: 3.6 mmol/L (ref 3.5–5.1)
Sodium: 137 mmol/L (ref 135–145)

## 2020-06-12 LAB — GLUCOSE, CAPILLARY
Glucose-Capillary: 233 mg/dL — ABNORMAL HIGH (ref 70–99)
Glucose-Capillary: 275 mg/dL — ABNORMAL HIGH (ref 70–99)
Glucose-Capillary: 243 mg/dL — ABNORMAL HIGH (ref 70–99)
Glucose-Capillary: 240 mg/dL — ABNORMAL HIGH (ref 70–99)

## 2020-06-12 LAB — HEPARIN LEVEL (UNFRACTIONATED): Heparin Unfractionated: 0.38 IU/mL (ref 0.30–0.70)

## 2020-06-12 LAB — PROTIME-INR
INR: 1.2 (ref 0.8–1.2)
Prothrombin Time: 14.8 seconds (ref 11.4–15.2)

## 2020-06-12 MED ORDER — WARFARIN SODIUM 4 MG PO TABS
4.0000 mg | ORAL_TABLET | Freq: Once | ORAL | Status: AC
  Administered 2020-06-12: 4 mg via ORAL
  Filled 2020-06-12 (×2): qty 1

## 2020-06-12 MED ORDER — INSULIN GLARGINE 100 UNIT/ML ~~LOC~~ SOLN
10.0000 [IU] | Freq: Every day | SUBCUTANEOUS | Status: DC
  Administered 2020-06-12: 10 [IU] via SUBCUTANEOUS
  Filled 2020-06-12 (×2): qty 0.1

## 2020-06-12 MED ORDER — WARFARIN - PHARMACIST DOSING INPATIENT: Freq: Every day | Status: DC

## 2020-06-12 NOTE — Progress Notes (Addendum)
[place] for heparin/warfarin Indication: hx bioprosthetic AVR, stroke, afib  Allergies  Allergen Reactions  . Ibuprofen Nausea And Vomiting  . Sulfamethoxazole-Trimethoprim Itching, Swelling and Rash    Swelling of the face  . Sulfonamide Derivatives Itching, Swelling and Rash    Swelling of the face  . Tape Rash    Paper tape is ok  . Tramadol Nausea And Vomiting  . Doxycycline Nausea Only  . Hydrocil [Psyllium] Nausea And Vomiting  . Bactrim Itching, Swelling and Rash  . Red Dye Itching and Rash   Patient Measurements: Height: 5\' 1"  (154.9 cm) Weight: 81.5 kg (179 lb 10.8 oz) IBW/kg (Calculated) : 47.8 Heparin Dosing Weight: 66.4 kg  Vital Signs: Temp: 97.8 F (36.6 C) (11/25 0321) Temp Source: Oral (11/25 0321) BP: 136/85 (11/25 0600) Pulse Rate: 90 (11/25 0600)  Labs: Recent Labs    06/10/20 0414 06/10/20 0414 06/10/20 1322 06/10/20 2300 06/11/20 0453 06/12/20 0358  HGB 11.3*   < >  --   --  11.0* 10.5*  HCT 37.7  --   --   --  36.5 33.7*  PLT 220  --   --   --  219 212  HEPARINUNFRC <0.10*  --    < > 0.48 0.55 0.38  CREATININE 2.66*  --   --   --  2.24* 2.52*   < > = values in this interval not displayed.    Estimated Creatinine Clearance: 18.4 mL/min (A) (by C-G formula based on SCr of 2.52 mg/dL (H)).   Medications:  - on warfarin PTA  Assessment: Patient is a 74 y.o F with hx bioprosthetic AVR, stroke, and afib on warfarin PTA presented to the ED on 11/16 with fever. Pharmacy is consulted to transition to heparin drip on admission.  Pt home warfarin regimen is warfarin 2.5 mg PO daily except 5mg  on thursdays   Today, 06/12/2020:  Heparin level 0.38- therapeutic on 1800 units/hr   CBC stable  SCr 2.52, CrCl ~ 18 ml/min    No bleeding issues noted per nursing  Per RN, no infusion issues    Warfarin due to start today - INR is 1.2   Goal of Therapy:  Heparin level 0.3-0.7 units/ml Monitor  platelets by anticoagulation protocol: Yes  Target INR per Warfarin clinic notes 05/23/2020   Plan:   Continue heparin infusion at 1800 units/hr  Warfarin 4 mg PO x1 today   Daily heparin level, CBC  Monitor closely for s/sx of bleeding    Royetta Asal, PharmD, BCPS 06/12/2020 2:20 PM

## 2020-06-12 NOTE — Progress Notes (Signed)
PROGRESS NOTE    [name]  EVO:[id number] DOB: 28-Oct-1945 DOA: 06/03/2020 PCP: [name], MD    Chief Complaint  Patient presents with  . Fever    Brief Narrative:  74 year old female with history of CAD s/p stents, severe AS s/p AVR, OSA/COPD/chronic RF with as needed O2, diastolic CHF, A. Fib s/p maze on warfarin, DM-2, ESRD s/p transplant now with CKD-4, adenocarcinoma of lung, embolic CVA with residual aphasia and BLE nonhealing wounds s/p abdominal aortogram on 11/12 found to have fever, tachypnea, tachycardia with difficulty clearing secretions after she called EMS for epistaxis.  Patient was admitted for sepsis due to LLE purulent cellulitis and Proteus UTI, and A. fib with RVR, acute on chronic diastolic CHF and acute on chronic hypoxemic RF.  Empirically treated with vancomycin and meropenem which were narrowed to Zyvox.  Also treated with IV Lasix for CHF exacerbation.  Cardiology and palliative medicine following.  PMT planning to meet with family today to discuss goals of care.   Assessment & Plan:   Principal Problem:   Sepsis ([place]) Active Problems:   Type 2 diabetes mellitus with chronic kidney disease, with long-term current use of insulin (HCC)   Hyperlipidemia LDL goal <70   Renal transplant recipient   S/P aortic valve replacement with bioprosthetic valve and maze procedure   Acute on chronic diastolic CHF (congestive heart failure) (HCC)   History of stroke   Venous stasis ulcers of both lower extremities (HCC)   ESRD (end stage renal disease) (HCC)   Atrial fibrillation with rapid ventricular response ([place])   Pulmonary hypertension, unspecified ([place])   Palliative care by specialist   Goals of care, counseling/discussion   General weakness   Cellulitis of both lower extremities   Ulcer of left lower extremity with fat layer exposed ([place])   Urinary tract infection without hematuria   1 sepsis secondary to left lower extremity purulent  cellulitis/infected nonhealing wounds due to PAD and possible Proteus UTI, POA Patient pancultured.  Blood cultures with no growth to date.  Urine cultures with 10,000 colonies of Proteus Mirabella's.  2D echo which was done did not show a large vegetation.  Patient noted to have had an abdominal aortogram on 05/30/2020 that showed heavily calcified bilateral proximal arteries with flush occlusion of bilateral superficial femoral artery.  CT left lower extremity with open ulceration with cellulitis and myofascitis. -Per vascular surgery amputation will be next consideration if pain was unbearable.  Palliative care following and helping manage patient's current pain as well as goals of care. -Was on vancomycin and meropenem and has been transitioned to Zyvox. -Norvasc discontinued secondary to edema. -Continue current IV antibiotics.  Follow.  2.  Acute on chronic hypoxemic respiratory failure secondary to acute on chronic diastolic CHF exacerbation 2D echo done with EF of 55 to 60%, right wall motion abnormalities, RVSP 73.4, severe LAE, moderate RAE, moderate to severe MVR, severe mitral annular calcification and severe TR.  Patient was on IV Lasix and has been transitioned to oral torsemide which she seems to be tolerating.  Clinical improvement.  Urine output of 600 cc recorded over the past 24 hours.  Likely transition from IV Lopressor to oral Lopressor tomorrow.  Cardiology was following.   3.  Moderate to severe mitral valve regurgitation/severe tricuspid valvular regurgitation Per cardiology not a candidate for surgical intervention at this time.  Continue diuretics and cardiac medications.  Outpatient follow-up with cardiology.  4.  History of severe AI status post AVR with  prosthetic valve Stable.  Per cardiology.  5.  A. fib with RVR Currently rate controlled on IV Lopressor.  Patient has been refusing oral medications.  Likely transition back to oral Lopressor tomorrow per cardiology  recommendations.  On heparin drip and being transitioned to Coumadin.  Follow.  6.  History of coronary artery disease status post stents Continue beta-blocker, anticoagulation.  Per cardiology.  7.  History of embolic CVA with residual expressive aphasia CT head done during this hospitalization showed prior infarct at the junction of the left temporoparietal and occipital lobes.  No acute infarct noted.  Patient being followed by speech therapy.  Continue risk factor modification, beta-blocker, on IV heparin and transition to Coumadin today.  Supportive care.  Follow.   8.  Abdominal pain CT abdomen and pelvis without contrast revealed new body wall edema.  Recent arteriogram does not look like ischemic bowel.  Improving.  9.  History of end-stage renal disease status post renal transplant now with chronic kidney disease stage IV Stable.  Continue low-dose prednisone and tacrolimus.  Diet liberated due to poor oral intake.  Outpatient follow-up with nephrology.  10.  Uncontrolled type 2 diabetes mellitus Hemoglobin A1c 9.2 (06/03/2020 ).  CBG of 233 this morning.  Start Lantus 10 units daily and uptitrate as needed for better blood glucose control.  Sliding scale insulin.  11.  Hypertension Stable.  Continue torsemide, metoprolol.  Follow.   12.  Left upper extremity swelling Left upper extremity Dopplers negative for DVT.  Outpatient follow-up.  13.  Hyperlipidemia Statin on hold.  14.  Goals of care Patient with multiple comorbidities.  Patient with a poor prognosis.  Patient full code.  Palliative care following and discussing with family.  15.  Inadequate oral intake Continue nutritional supplementation.  DVT prophylaxis: heparin>>>> Coumadin Code Status: Full Family Communication: Updated patient.  No family at bedside.  Disposition:   Status is: Inpatient    Dispo: The patient is from: Home              Anticipated d/c is to: SNF for palliative care following               Anticipated d/c date is: To be determined.              Patient currently being diuresed due to CHF exacerbation, has arterial ulcers, on IV antibiotics, not medically stable for discharge.       Consultants:   Wound care nurse Domenic Moras, FNP 06/03/2020  Cardiology: Dr. Oval Linsey 06/03/2020  Palliative care: Dr. Domingo Cocking 06/04/2020    Procedures:   CT left tib-fib 06/05/2020  CT abdomen and pelvis 06/03/2020  CT head 06/03/2020  Chest x-ray 06/03/2020  2D echo 06/03/2020  Left upper extremity Dopplers 06/07/2020  Antimicrobials:   Oral Zyvox 06/06/2020>>> 06/10/2020  Oral Flagyl 06/04/2020>>>> 06/05/2020  IV Ancef 06/05/2020 x1 dose  IV cefepime 06/03/2020 x 1 dose  IV Rocephin 06/04/2020>>>> 06/05/2020  IV Zyvox 06/04/2020 >>> 06/05/2020, 06/10/2020>>>  IV Merrem 06/03/2020>>> 06/04/2020  IV Zosyn 06/03/2020 x 1 dose  IV vancomycin 06/03/2020 x 1 dose   Subjective: Patient awake, alert.  Denies chest pain or shortness of breath.  Feeling better.  Denies any bleeding.  Agreeable to take oral medications.  States oral intake is improving.   Objective: Vitals:   06/12/20 0000 06/12/20 0321 06/12/20 0500 06/12/20 0600  BP: 135/70 136/85  136/85  Pulse: 99 90  90  Resp: 18     Temp: 99.3 F (  37.4 C) 97.8 F (36.6 C)    TempSrc: Oral Oral    SpO2: 97% 94%    Weight:   81.5 kg   Height:        Intake/Output Summary (Last 24 hours) at 06/12/2020 1040 Last data filed at 06/12/2020 0900 Gross per 24 hour  Intake 320 ml  Output 200 ml  Net 120 ml   Filed Weights   06/10/20 0559 06/11/20 0500 06/12/20 0500  Weight: 81.8 kg 81.5 kg 81.5 kg    Examination:  General exam: NAD Respiratory system: CTA B.  Speaking in full sentences.  Normal respiratory effort.  Cardiovascular system: Irregularly irregular.  No murmurs rubs or gallops.  No JVD.  No lower extremity edema.   Gastrointestinal system: Abdomen is soft, nontender, nondistended,  positive bowel sounds.  No rebound.  No guarding.  Central nervous system: Alert and oriented. No focal neurological deficits. Extremities: Left lower extremity bandaged.  Left upper extremity swelling.  Symmetric 5 x 5 power. Skin: No rashes, lesions or ulcers Psychiatry: Judgement and insight appear normal. Mood & affect appropriate.     Data Reviewed: I have personally reviewed following labs and imaging studies  CBC: Recent Labs  Lab 06/07/20 0450 06/09/20 0418 06/10/20 0414 06/11/20 0453 06/12/20 0358  WBC 13.3* 12.1* 9.7 11.2* 11.7*  NEUTROABS  --  10.2* 8.1*  --   --   HGB 11.1* 11.1* 11.3* 11.0* 10.5*  HCT 37.5 36.7 37.7 36.5 33.7*  MCV 79.6* 77.9* 78.2* 79.0* 77.5*  PLT 193 219 220 219 299    Basic Metabolic Panel: Recent Labs  Lab 06/08/20 0350 06/09/20 0418 06/10/20 0414 06/11/20 0453 06/12/20 0358  NA 141 137 139 138 137  K 3.5 4.0 3.8 3.7 3.6  CL 97* 95* 96* 97* 96*  CO2 29 31 30 29  32  GLUCOSE 108* 276* 168* 144* 247*  BUN 39* 43* 46* 42* 50*  CREATININE 2.48* 2.67* 2.66* 2.24* 2.52*  CALCIUM 9.5 9.6 9.9 9.8 9.7  MG 2.1 2.0 1.9 2.0  --   PHOS  --  3.7 2.8 2.9 3.2    GFR: Estimated Creatinine Clearance: 18.4 mL/min (A) (by C-G formula based on SCr of 2.52 mg/dL (H)).  Liver Function Tests: Recent Labs  Lab 06/08/20 0350 06/09/20 0418 06/10/20 0414 06/11/20 0453 06/12/20 0358  AST 54*  --   --   --   --   ALT 18  --   --   --   --   ALKPHOS 672*  --   --   --   --   BILITOT 1.0  --   --   --   --   PROT 6.4*  --   --   --   --   ALBUMIN 2.4* 2.3* 2.4* 2.3* 2.3*    CBG: Recent Labs  Lab 06/11/20 0725 06/11/20 1131 06/11/20 1649 06/11/20 2135 06/12/20 0736  GLUCAP 166* 142* 208* 198* 233*     Recent Results (from the past 240 hour(s))  Culture, blood (Routine x 2)     Status: None   Collection Time: 06/03/20  4:07 AM   Specimen: BLOOD  Result Value Ref Range Status   Specimen Description   Final    BLOOD RIGHT  ANTECUBITAL Performed at [facility], [place] [address] [place],  [address]    Special Requests   Final    BOTTLES DRAWN AEROBIC AND ANAEROBIC Blood Culture adequate volume Performed at [facility],  [place] [address], [place], [place] [address]    Culture   Final    NO GROWTH 5 DAYS Performed at [facility], [place] [address] [place], [place] [address]    Report Status 06/08/2020 FINAL  Final  Culture, blood (Routine x 2)     Status: None   Collection Time: 06/03/20  4:07 AM   Specimen: BLOOD RIGHT HAND  Result Value Ref Range Status   Specimen Description   Final    BLOOD RIGHT HAND Performed at [place] [address] [place], [place] [address]    Special Requests   Final    BOTTLES DRAWN AEROBIC AND ANAEROBIC Blood Culture results may not be optimal due to an inadequate volume of blood received in culture bottles Performed at [place] [address], [place], [place] [address]    Culture   Final    NO GROWTH 5 DAYS Performed at [facility], [place] [address] [place], [place] [address]    Report Status 06/08/2020 FINAL  Final  Urine culture     Status: Abnormal   Collection Time: 06/03/20  4:29 AM   Specimen: In/Out Cath Urine  Result Value Ref Range Status   Specimen Description   Final    IN/OUT CATH URINE Performed at [place] [address] [place],  [address]    Special Requests   Final    NONE Performed at [facility], [place] [address] [place], [place] [address]    Culture 10,000 COLONIES/mL PROTEUS MIRABILIS (A)  Final   Report Status 06/05/2020 FINAL  Final   Organism ID, Bacteria PROTEUS MIRABILIS (A)  Final      Susceptibility   Proteus mirabilis - MIC*    AMPICILLIN <=2 SENSITIVE Sensitive     CEFAZOLIN <=4 SENSITIVE Sensitive     CEFEPIME <=0.12 SENSITIVE Sensitive     CEFTRIAXONE <=0.25 SENSITIVE  Sensitive     CIPROFLOXACIN <=0.25 SENSITIVE Sensitive     GENTAMICIN <=1 SENSITIVE Sensitive     IMIPENEM 8 INTERMEDIATE Intermediate     NITROFURANTOIN 128 RESISTANT Resistant     TRIMETH/SULFA <=20 SENSITIVE Sensitive     AMPICILLIN/SULBACTAM <=2 SENSITIVE Sensitive     PIP/TAZO <=4 SENSITIVE Sensitive     * 10,000 COLONIES/mL PROTEUS MIRABILIS  Resp Panel by RT PCR (RSV, Flu A&B, Covid) - Nasopharyngeal Swab     Status: None   Collection Time: 06/03/20  8:14 AM   Specimen: Nasopharyngeal Swab  Result Value Ref Range Status   SARS Coronavirus 2 by RT PCR NEGATIVE NEGATIVE Final    Comment: (NOTE) SARS-CoV-2 target nucleic acids are NOT DETECTED.  The SARS-CoV-2 RNA is generally detectable in upper respiratoy specimens during the acute phase of infection. The lowest concentration of SARS-CoV-2 viral copies this assay can detect is 131 copies/mL. A negative result does not preclude SARS-Cov-2 infection and should not be used as the sole basis for treatment or other patient management decisions. A negative result may occur with  improper specimen collection/handling, submission of specimen other than nasopharyngeal swab, presence of viral mutation(s) within the areas targeted by this assay, and inadequate number of viral copies (<131 copies/mL). A negative result must be combined with clinical observations, patient history, and epidemiological information. The expected result is Negative.  Fact Sheet for Patients:  PinkCheek.be  Fact Sheet for Healthcare Providers:  GravelBags.it  This test is no t yet approved or cleared by the  [place] [employer] and  has been authorized for detection and/or diagnosis of SARS-CoV-2 by FDA under an Print production planner (EUA). This EUA will remain  in effect (meaning this test can be used) for the duration of the COVID-19 declaration under Section 564(b)(1) of the Act, 21  U.S.C. section 360bbb-3(b)(1), unless the authorization is terminated or revoked sooner.     Influenza A by PCR NEGATIVE NEGATIVE Final   Influenza B by PCR NEGATIVE NEGATIVE Final    Comment: (NOTE) The Xpert Xpress SARS-CoV-2/FLU/RSV assay is intended as an aid in  the diagnosis of influenza from Nasopharyngeal swab specimens and  should not be used as a sole basis for treatment. Nasal washings and  aspirates are unacceptable for Xpert Xpress SARS-CoV-2/FLU/RSV  testing.  Fact Sheet for Patients: PinkCheek.be  Fact Sheet for Healthcare Providers: GravelBags.it  This test is not yet approved or cleared by the [place] FDA and  has been authorized for detection and/or diagnosis of SARS-CoV-2 by  FDA under an Emergency Use Authorization (EUA). This EUA will remain  in effect (meaning this test can be used) for the duration of the  Covid-19 declaration under Section 564(b)(1) of the Act, 21  U.S.C. section 360bbb-3(b)(1), unless the authorization is  terminated or revoked.    Respiratory Syncytial Virus by PCR NEGATIVE NEGATIVE Final    Comment: (NOTE) Fact Sheet for Patients: PinkCheek.be  Fact Sheet for Healthcare Providers: GravelBags.it  This test is not yet approved or cleared by the [place] FDA and  has been authorized for detection and/or diagnosis of SARS-CoV-2 by  FDA under an Emergency Use Authorization (EUA). This EUA will remain  in effect (meaning this test can be used) for the duration of the  COVID-19 declaration under Section 564(b)(1) of the Act, 21 U.S.C.  section 360bbb-3(b)(1), unless the authorization is terminated or  revoked. Performed at [facility] [place] [address], [place], [place] [address]   MRSA PCR Screening     Status: Abnormal   Collection Time: 06/03/20  2:00 PM   Specimen: Nasal Mucosa;  Nasopharyngeal  Result Value Ref Range Status   MRSA by PCR POSITIVE (A) NEGATIVE Final    Comment:        The GeneXpert MRSA Assay (FDA approved for NASAL specimens only), is one component of a comprehensive MRSA colonization surveillance program. It is not intended to diagnose MRSA infection nor to guide or monitor treatment for MRSA infections. RESULT CALLED TO, READ BACK BY AND VERIFIED WITH: HEAVNER,L. RN @1848  06/03/20 BILLINGSLEY,L Performed at [facility], [place] [address], [place], [place] [address]          Radiology Studies: No results found.      Scheduled Meds: . acetaminophen  650 mg Oral Q6H  . collagenase   Topical Daily  . feeding supplement  237 mL Oral TID BM  . fentaNYL  1 patch Transdermal Q72H  . insulin aspart  0-5 Units Subcutaneous QHS  . insulin aspart  0-6 Units Subcutaneous TID WC  . insulin aspart  2 Units Subcutaneous TID WC  . insulin glargine  10 Units Subcutaneous Daily  . levothyroxine  25 mcg Intravenous Daily  . metoprolol tartrate  2.5 mg Intravenous Q6H  . predniSONE  5 mg Oral QAC breakfast  . sodium chloride flush  3 mL Intravenous Q12H  . tacrolimus  3 mg Oral BID  . torsemide  100 mg Oral Daily   Continuous Infusions: . heparin 1,800 Units/hr (  06/11/20 2019)  . linezolid (ZYVOX) IV 600 mg (06/12/20 0557)     LOS: 9 days    Time spent: 40 minutes    Irine Seal, MD [facility]   To contact the attending provider between 7A-7P or the covering provider during after hours 7P-7A, please log into the web site [email or web address] and access using universal [place] password for that web site. If you do not have the password, please call the hospital operator.  06/12/2020, 10:40 AM

## 2020-06-12 NOTE — Progress Notes (Signed)
Daily Progress Note   Patient Name: [name]       Date: 06/12/2020 DOB: September 20, 1945  Age: 74 y.o. MRN#: [id number] Attending Physician: Eugenie Filler, MD Primary Care Physician: [name], MD Admit Date: 06/03/2020  Reason for Consultation/Follow-up: Establishing goals of care and Pain control  Subjective:  Patient is awake alert, able to eat when fed by bedside staff, verbalizes some, no distress.      Length of Stay: 9  Current Medications: Scheduled Meds:   acetaminophen  650 mg Oral Q6H   collagenase   Topical Daily   feeding supplement  237 mL Oral TID BM   fentaNYL  1 patch Transdermal Q72H   insulin aspart  0-5 Units Subcutaneous QHS   insulin aspart  0-6 Units Subcutaneous TID WC   insulin aspart  2 Units Subcutaneous TID WC   insulin glargine  10 Units Subcutaneous Daily   levothyroxine  25 mcg Intravenous Daily   metoprolol tartrate  2.5 mg Intravenous Q6H   predniSONE  5 mg Oral QAC breakfast   sodium chloride flush  3 mL Intravenous Q12H   tacrolimus  3 mg Oral BID   torsemide  100 mg Oral Daily    Continuous Infusions:  heparin 1,800 Units/hr (06/12/20 1100)   linezolid (ZYVOX) IV 600 mg (06/12/20 0557)    PRN Meds: HYDROmorphone (DILAUDID) injection, oxyCODONE, polyethylene glycol  Physical Exam    General:  awake alert responds some.  HEENT: No bruits, no goiter, no JVD Heart: Regular rate and rhythm. No murmur appreciated. Lungs: Good air movement, clear Abdomen: Soft, nontender, nondistended, positive bowel sounds.  Skin: Warm and dry Neuro:  more alert today      Ext: Left arm notably swollen and warm and hand versus right arm. LLE in dressing, chart reviewed.   Vital Signs: BP 136/85    Pulse 90    Temp 97.8  F (36.6 C) (Oral)    Resp 18    Ht 5\' 1"  (1.549 m)    Wt 81.5 kg    SpO2 94%    BMI 33.95 kg/m  SpO2: SpO2: 94 % O2 Device: O2 Device: Nasal Cannula O2 Flow Rate: O2 Flow Rate (L/min): 2 L/min  Intake/output summary:   Intake/Output Summary (Last 24 hours) at 06/12/2020 1240 Last data filed at  06/12/2020 1220 Gross per 24 hour  Intake 320 ml  Output 500 ml  Net -180 ml   LBM: Last BM Date: 06/11/20 Baseline Weight: Weight: 82 kg Most recent weight: Weight: 81.5 kg       Palliative Assessment/Data:    Flowsheet Rows     Most Recent Value  Intake Tab  Referral Department Hospitalist  Unit at Time of Referral Med/Surg Unit  Date Notified 06/03/20  Palliative Care Type New Palliative care  Reason for referral Clarify Goals of Care  Date of Admission 06/03/20  Date first seen by Palliative Care 06/04/20  # of days Palliative referral response time 1 Day(s)  # of days IP prior to Palliative referral 0  Clinical Assessment  Psychosocial & Spiritual Assessment  Palliative Care Outcomes      Patient Active Problem List   Diagnosis Date Noted   Cellulitis of both lower extremities    Ulcer of left lower extremity with fat layer exposed ([place])    Urinary tract infection without hematuria    Palliative care by specialist    Goals of care, counseling/discussion    General weakness    Pulmonary hypertension, unspecified ([place])    Atrial fibrillation with rapid ventricular response ([place]) 06/03/2020   Sepsis ([place]) 06/03/2020   ESRD (end stage renal disease) ([place])    Venous stasis ulcers of both lower extremities ([place]) 01/31/2020   Venous stasis dermatitis of both lower extremities 01/31/2020   Claudication of both lower extremities ([place]) 01/31/2020   Chronic venous insufficiency 12/31/2019   Heart failure ([place]) 10/02/2019   Dyspnea 08/20/2019   COPD (chronic obstructive pulmonary disease) ([place]) 08/20/2019   Hematoma of arm, left, subsequent encounter  07/23/2019   Obesity (BMI 30-39.9) 12/20/2018   Acute respiratory failure with hypoxia ([place]) 12/19/2018   Gait disorder 11/22/2018   History of seizure disorder 09/12/2018   History of stroke 09/12/2018   Edema 08/28/2018   Hypertension secondary to other renal disorders 03/22/2018   Proteinuria 03/22/2018   Malignant neoplasm of lung ([place]) 03/22/2018   Breast pain in female 02/04/2018   Lung cancer ([place]) 11/30/2017   Adenocarcinoma of lung, stage 1, right ([place]) 11/25/2017   Chronic respiratory failure with hypoxia ([place]) 12/27/2016   Acute on chronic diastolic CHF (congestive heart failure) ([place]) 12/27/2016   Left upper extremity swelling 12/27/2016   Pain of left breast 12/27/2016   Arm muscle atrophy 11/16/2016   Insomnia 02/25/2016   Renal cyst 11/21/2015   Renal mass, right 11/10/2015   Shoulder pain, right 08/13/2015   Weight gain 08/12/2014   Multinodular goiter 05/01/2014   Encounter for therapeutic drug monitoring 08/14/2013   S/P aortic valve replacement with bioprosthetic valve and maze procedure 04/12/2013   Nodule of right lung 04/07/2013   Nodule of left lung 04/07/2013   Pain in limb 05/22/2012   Dermatitis 01/26/2012   Long term (current) use of anticoagulants 08/17/2011   Anemia in chronic renal disease 05/13/2011   Hypokalemia 04/26/2011   Immunosuppression ([place]) 04/26/2011   Hypertension associated with diabetes ([place]) 04/26/2011   Renal transplant recipient 03/16/2011   Low back pain 02/23/2011   Hypersalivation 11/24/2010   AORTIC STENOSIS 08/20/2010   DISTURBANCE OF SALIVARY SECRETION 07/28/2010   NAUSEA 04/14/2010   SMOKER 10/07/2009   ALOPECIA 10/07/2009   CAROTID STENOSIS 03/04/2009   AF (paroxysmal atrial fibrillation) ([place]) 12/31/2008   NEOPLASM, MALIGNANT, KIDNEY 07/02/2008   APHASIA DUE TO CEREBROVASCULAR DISEASE 07/02/2008   Chronic fatigue 05/21/2008   Hyperlipidemia  LDL goal <70 09/27/2007     Constipation 09/27/2007   SLEEP APNEA 09/27/2007   CHOLELITHIASIS 72/03/4708   HELICOBACTER PYLORI INFECTION, HX OF 06/08/2007   Type 2 diabetes mellitus with chronic kidney disease, with long-term current use of insulin ([place]) 05/23/2007   Gout 05/23/2007   Hypertension due to kidney transplant 05/23/2007   MYOCARDIAL INFARCTION, HX OF 05/23/2007   Coronary atherosclerosis 05/23/2007   GERD 05/23/2007   COLONIC POLYPS, HX OF 05/23/2007   DIVERTICULOSIS, COLON 07/01/2005    Palliative Care Assessment & Plan   Patient Profile: 74 y.o. female  with past medical history of CAD status post stent, severe aortic stenosis status post AVR, hypertension, hyperlipidemia, OSA, COPD with intermittent O2 requirement, HFpEF, atrial fibrillation status post maze on Coumadin, type 2 diabetes, end-stage renal disease status post transplant, CKD 4, adenocarcinoma of the lung, embolic CVA with residual aphasia, bilateral lower extremity nonhealing wounds and recent abdominal aortogram with out actionable findings (would likely need eventual AKA) admitted on 06/03/2020 with sepsis, concern for endocarditis, A. fib and worsening renal failure.  Palliative consulted for goals of care.  Assessment: Patient Active Problem List   Diagnosis Date Noted   Cellulitis of both lower extremities    Ulcer of left lower extremity with fat layer exposed ([place])    Urinary tract infection without hematuria    Palliative care by specialist    Goals of care, counseling/discussion    General weakness    Pulmonary hypertension, unspecified ([place])    Atrial fibrillation with rapid ventricular response ([place]) 06/03/2020   Sepsis ([place]) 06/03/2020   ESRD (end stage renal disease) ([place])    Venous stasis ulcers of both lower extremities ([place]) 01/31/2020   Venous stasis dermatitis of both lower extremities 01/31/2020   Claudication of both lower extremities ([place]) 01/31/2020   Chronic venous insufficiency  12/31/2019   Heart failure ([place]) 10/02/2019   Dyspnea 08/20/2019   COPD (chronic obstructive pulmonary disease) ([place]) 08/20/2019   Hematoma of arm, left, subsequent encounter 07/23/2019   Obesity (BMI 30-39.9) 12/20/2018   Acute respiratory failure with hypoxia ([place]) 12/19/2018   Gait disorder 11/22/2018   History of seizure disorder 09/12/2018   History of stroke 09/12/2018   Edema 08/28/2018   Hypertension secondary to other renal disorders 03/22/2018   Proteinuria 03/22/2018   Malignant neoplasm of lung ([place]) 03/22/2018   Breast pain in female 02/04/2018   Lung cancer ([place]) 11/30/2017   Adenocarcinoma of lung, stage 1, right ([place]) 11/25/2017   Chronic respiratory failure with hypoxia ([place]) 12/27/2016   Acute on chronic diastolic CHF (congestive heart failure) ([place]) 12/27/2016   Left upper extremity swelling 12/27/2016   Pain of left breast 12/27/2016   Arm muscle atrophy 11/16/2016   Insomnia 02/25/2016   Renal cyst 11/21/2015   Renal mass, right 11/10/2015   Shoulder pain, right 08/13/2015   Weight gain 08/12/2014   Multinodular goiter 05/01/2014   Encounter for therapeutic drug monitoring 08/14/2013   S/P aortic valve replacement with bioprosthetic valve and maze procedure 04/12/2013   Nodule of right lung 04/07/2013   Nodule of left lung 04/07/2013   Pain in limb 05/22/2012   Dermatitis 01/26/2012   Long term (current) use of anticoagulants 08/17/2011   Anemia in chronic renal disease 05/13/2011   Hypokalemia 04/26/2011   Immunosuppression ([place]) 04/26/2011   Hypertension associated with diabetes ([place]) 04/26/2011   Renal transplant recipient 03/16/2011   Low back pain 02/23/2011   Hypersalivation 11/24/2010   AORTIC STENOSIS 08/20/2010  DISTURBANCE OF SALIVARY SECRETION 07/28/2010   NAUSEA 04/14/2010   SMOKER 10/07/2009   ALOPECIA 10/07/2009   CAROTID STENOSIS 03/04/2009   AF (paroxysmal atrial fibrillation) ([place])  12/31/2008   NEOPLASM, MALIGNANT, KIDNEY 07/02/2008   APHASIA DUE TO CEREBROVASCULAR DISEASE 07/02/2008   Chronic fatigue 05/21/2008   Hyperlipidemia LDL goal <70 09/27/2007   Constipation 09/27/2007   SLEEP APNEA 09/27/2007   CHOLELITHIASIS 28/41/3244   HELICOBACTER PYLORI INFECTION, HX OF 06/08/2007   Type 2 diabetes mellitus with chronic kidney disease, with long-term current use of insulin ([place]) 05/23/2007   Gout 05/23/2007   Hypertension due to kidney transplant 05/23/2007   MYOCARDIAL INFARCTION, HX OF 05/23/2007   Coronary atherosclerosis 05/23/2007   GERD 05/23/2007   COLONIC POLYPS, HX OF 05/23/2007   DIVERTICULOSIS, COLON 07/01/2005    Recommendations/Plan: - Pain: fentanyl patch 25 mcg/h  and scheduled on Tylenol 650 four times also has oxycodone 5 mg every 4 hours as needed for breakthrough pain.  Full code, full scope for now.  11-24:Call placed and discussed with husband, he states that their 2 daughters are arriving from out of town tonight, family to continue discussions regarding code status and next steps, PMT to follow.  Family to gather for the holiday today and discuss with each other, PMT will follow up with them on 11-26, overall, patient with more awake ness and alertness and slightly increased PO intake since the past 24-48 hours or so. Ongoing discussions with family regarding code status and disposition options.    Goals of Care and Additional Recommendations:  Limitations on Scope of Treatment: Full Scope Treatment  Code Status:    Code Status Orders  (From admission, onward)         Start     Ordered   06/03/20 0919  Full code  Continuous        06/03/20 0918        Code Status History    Date Active Date Inactive Code Status Order ID Comments User Context   05/30/2020 1320 05/30/2020 2355 Full Code [id number]  Elam Dutch, MD Inpatient   10/01/2019 2327 10/11/2019 1851 Full Code [id number]  Etta Quill, DO ED    12/19/2018 2042 12/26/2018 1751 Full Code [id number]  [place] Eric J, DO Inpatient   02/04/2018 0244 02/08/2018 1925 Full Code [id number]  Vianne Bulls, MD ED   12/27/2016 2146 01/01/2017 1532 Full Code [id number]  Ivor Costa, MD ED   04/12/2013 1330 04/23/2013 1855 Full Code [id number]  Rexene Alberts, MD Inpatient   Advance Care Planning Activity    Advance Directive Documentation     Most Recent Value  Type of Advance Directive Healthcare Power of Attorney  Pre-existing out of facility DNR order (yellow form or pink MOST form) --  "MOST" Form in Place? --       Prognosis:   Unable to determine  Discharge Planning:  To be determined.   Care plan was discussed with IDT Thank you for allowing the Palliative Medicine Team to assist in the care of this patient.   Time In: 12 Time Out: 12.25 Total Time 25 Prolonged Time Billed No  Greater than 50%  of this time was spent counseling and coordinating care related to the above assessment and plan.  Loistine Chance, MD  Please contact Palliative Medicine Team phone at [phone] for questions and concerns.

## 2020-06-13 DIAGNOSIS — R531 Weakness: Secondary | ICD-10-CM | POA: Diagnosis not present

## 2020-06-13 DIAGNOSIS — Z515 Encounter for palliative care: Secondary | ICD-10-CM | POA: Diagnosis not present

## 2020-06-13 DIAGNOSIS — A419 Sepsis, unspecified organism: Secondary | ICD-10-CM | POA: Diagnosis not present

## 2020-06-13 DIAGNOSIS — I5033 Acute on chronic diastolic (congestive) heart failure: Secondary | ICD-10-CM | POA: Diagnosis not present

## 2020-06-13 DIAGNOSIS — L03115 Cellulitis of right lower limb: Secondary | ICD-10-CM | POA: Diagnosis not present

## 2020-06-13 DIAGNOSIS — N390 Urinary tract infection, site not specified: Secondary | ICD-10-CM | POA: Diagnosis not present

## 2020-06-13 DIAGNOSIS — I4891 Unspecified atrial fibrillation: Secondary | ICD-10-CM | POA: Diagnosis not present

## 2020-06-13 DIAGNOSIS — Z7189 Other specified counseling: Secondary | ICD-10-CM | POA: Diagnosis not present

## 2020-06-13 LAB — CBC
HCT: 35 % — ABNORMAL LOW (ref 36.0–46.0)
Hemoglobin: 10.9 g/dL — ABNORMAL LOW (ref 12.0–15.0)
MCH: 24.1 pg — ABNORMAL LOW (ref 26.0–34.0)
MCHC: 31.1 g/dL (ref 30.0–36.0)
MCV: 77.3 fL — ABNORMAL LOW (ref 80.0–100.0)
Platelets: 209 10*3/uL (ref 150–400)
RBC: 4.53 MIL/uL (ref 3.87–5.11)
RDW: 19.4 % — ABNORMAL HIGH (ref 11.5–15.5)
WBC: 11 10*3/uL — ABNORMAL HIGH (ref 4.0–10.5)
nRBC: 0.3 % — ABNORMAL HIGH (ref 0.0–0.2)

## 2020-06-13 LAB — PROTIME-INR
INR: 1.1 (ref 0.8–1.2)
Prothrombin Time: 14.2 seconds (ref 11.4–15.2)

## 2020-06-13 LAB — GLUCOSE, CAPILLARY
Glucose-Capillary: 231 mg/dL — ABNORMAL HIGH (ref 70–99)
Glucose-Capillary: 239 mg/dL — ABNORMAL HIGH (ref 70–99)
Glucose-Capillary: 255 mg/dL — ABNORMAL HIGH (ref 70–99)
Glucose-Capillary: 126 mg/dL — ABNORMAL HIGH (ref 70–99)

## 2020-06-13 LAB — RENAL FUNCTION PANEL
Albumin: 2.5 g/dL — ABNORMAL LOW (ref 3.5–5.0)
Anion gap: 10 (ref 5–15)
BUN: 51 mg/dL — ABNORMAL HIGH (ref 8–23)
CO2: 31 mmol/L (ref 22–32)
Calcium: 10.1 mg/dL (ref 8.9–10.3)
Chloride: 96 mmol/L — ABNORMAL LOW (ref 98–111)
Creatinine, Ser: 2.42 mg/dL — ABNORMAL HIGH (ref 0.44–1.00)
GFR, Estimated: 20 mL/min — ABNORMAL LOW (ref >60)
Glucose, Bld: 216 mg/dL — ABNORMAL HIGH (ref 70–99)
Phosphorus: 2.9 mg/dL (ref 2.5–4.6)
Potassium: 3.6 mmol/L (ref 3.5–5.1)
Sodium: 137 mmol/L (ref 135–145)

## 2020-06-13 LAB — HEPARIN LEVEL (UNFRACTIONATED): Heparin Unfractionated: 0.74 IU/mL — ABNORMAL HIGH (ref 0.30–0.70)

## 2020-06-13 MED ORDER — LINEZOLID 600 MG PO TABS
600.0000 mg | ORAL_TABLET | Freq: Two times a day (BID) | ORAL | Status: DC
  Administered 2020-06-13 – 2020-06-16 (×7): 600 mg via ORAL
  Filled 2020-06-13 (×8): qty 1

## 2020-06-13 MED ORDER — WARFARIN SODIUM 4 MG PO TABS
4.0000 mg | ORAL_TABLET | Freq: Once | ORAL | Status: AC
  Administered 2020-06-13: 4 mg via ORAL
  Filled 2020-06-13: qty 1

## 2020-06-13 MED ORDER — METOPROLOL TARTRATE 25 MG PO TABS
25.0000 mg | ORAL_TABLET | Freq: Two times a day (BID) | ORAL | Status: DC
  Administered 2020-06-13 – 2020-06-16 (×8): 25 mg via ORAL
  Filled 2020-06-13 (×8): qty 1

## 2020-06-13 MED ORDER — INSULIN GLARGINE 100 UNIT/ML ~~LOC~~ SOLN
14.0000 [IU] | Freq: Every day | SUBCUTANEOUS | Status: DC
  Administered 2020-06-13 – 2020-06-16 (×4): 14 [IU] via SUBCUTANEOUS
  Filled 2020-06-13 (×5): qty 0.14

## 2020-06-13 MED ORDER — ENOXAPARIN SODIUM 80 MG/0.8ML ~~LOC~~ SOLN
1.0000 mg/kg | SUBCUTANEOUS | Status: DC
  Administered 2020-06-13 – 2020-06-16 (×4): 80 mg via SUBCUTANEOUS
  Filled 2020-06-13 (×4): qty 0.8

## 2020-06-13 NOTE — TOC Transition Note (Addendum)
Transition of Care [facility]) - CM/SW Discharge Note   Patient Details  Name: [name] MRN: [id number] Date of Birth: June 26, 1946  [facility] CM/SW Contact:  Leeroy Cha, RN Phone Number: 06/13/2020, 3:06 PM   Clinical Narrative:    hhc will be through [facility] for rn pt,ot,aide and sw. Patient already active [facility] tct-Cheryl Rose-will have office cover patient for rn,pt,ot aides and sw. [facility] will cover for hhc. Final next level of care: [place] Barriers to Discharge: Barriers Unresolved (comment)   Patient Goals and CMS Choice Patient states their goals for this hospitalization and ongoing recovery are:: to go home CMS Medicare.gov Compare Post Acute Care list provided to:: Patient Choice offered to / list presented to : Spouse  Discharge Placement                       Discharge Plan and Services   Discharge Planning Services: CM Consult Post Acute Care Choice: Skilled Nursing Facility                    HH Arranged: RN, PT, OT, Nurse's Aide, Social Work [facility] Agency: [place] ([place]) Date [place]: 06/13/20 Time [place]: 27 Representative spoke with at [place]: Ramond Marrow  Social Determinants of Health ([place]) Interventions     Readmission Risk Interventions Readmission Risk Prevention Plan 10/03/2019  Transportation Screening Complete  Medication Review Press photographer) Complete  HRI or [place] Complete  SW Recovery Care/Counseling Consult Complete  [place] Not Applicable  Some recent data might be hidden

## 2020-06-13 NOTE — Progress Notes (Signed)
[place] for heparin/warfarin Indication: hx bioprosthetic AVR, stroke, afib  Allergies  Allergen Reactions  . Ibuprofen Nausea And Vomiting  . Sulfamethoxazole-Trimethoprim Itching, Swelling and Rash    Swelling of the face  . Sulfonamide Derivatives Itching, Swelling and Rash    Swelling of the face  . Tape Rash    Paper tape is ok  . Tramadol Nausea And Vomiting  . Doxycycline Nausea Only  . Hydrocil [Psyllium] Nausea And Vomiting  . Bactrim Itching, Swelling and Rash  . Red Dye Itching and Rash   Patient Measurements: Height: 5\' 1"  (154.9 cm) Weight: 78.8 kg (173 lb 11.6 oz) IBW/kg (Calculated) : 47.8 Heparin Dosing Weight: 66.4 kg  Vital Signs: Temp: 98.5 F (36.9 C) (11/26 0412) BP: 143/88 (11/26 0412) Pulse Rate: 106 (11/26 0412)  Labs: Recent Labs    06/11/20 0453 06/12/20 0358 06/12/20 1400 06/13/20 0439  HGB 11.0* 10.5*  --   --   HCT 36.5 33.7*  --   --   PLT 219 212  --   --   LABPROT  --   --  14.8  --   INR  --   --  1.2  --   HEPARINUNFRC 0.55 0.38  --  0.74*  CREATININE 2.24* 2.52*  --   --     Estimated Creatinine Clearance: 18 mL/min (A) (by C-G formula based on SCr of 2.52 mg/dL (H)).   Medications:  - on warfarin PTA  Assessment: Patient is a 74 y.o F with hx bioprosthetic AVR, stroke, and afib on warfarin PTA presented to the ED on 11/16 with fever. Pharmacy is consulted to transition to heparin drip on admission.  Pt home warfarin regimen is warfarin 2.5 mg PO daily except 5mg  on thursdays   Today, 06/13/2020:  Heparin level 0.74- now supra-therapeutic on 1800 units/hr   CBC stable  SCr 2.42, CrCl ~ 18 ml/min    No bleeding issues noted per nursing  Per RN, no infusion issues    Warfarin resumed 11/25 - INR is 1.1  Patient eating 0-50% of meal tray + drinking Ensure x3 daily  No major drug-drug interactions noted  Goal of Therapy:  Heparin level 0.3-0.7 units/ml Monitor  platelets by anticoagulation protocol: Yes  Target INR 2-3 per Warfarin clinic notes 05/23/2020   Plan:   Decrease heparin infusion to 1650 units/hr  Recheck heparin level in 8h  Warfarin 4 mg PO x1 today   Daily heparin level, CBC  Monitor closely for s/sx of bleeding   [name], PharmD, BCPS 06/13/2020 4:57 AM

## 2020-06-13 NOTE — Progress Notes (Signed)
Physical Therapy Treatment Patient Details Name: [name] MRN: [id number] DOB: 1946-05-30 Today's Date: 06/13/2020    History of Present Illness 74 y.o. female  with past medical history of CAD status post stent, severe aortic stenosis status post AVR, hypertension, hyperlipidemia, OSA, COPD with intermittent O2 requirement, HFpEF, atrial fibrillation status post maze on Coumadin, type 2 diabetes, end-stage renal disease status post transplant, CKD 4, adenocarcinoma of the lung, embolic CVA with residual aphasia, bilateral lower extremity nonhealing wounds and recent abdominal aortogram with out actionable findings (would likely need eventual AKA) admitted on 06/03/2020 with sepsis, concern for endocarditis, A. fib and worsening renal failure.  Palliative consulted for goals of care.  Pt still full code and wishes are to go home    PT Comments    Pt much more alert today comparing to other PT and OT notes. Assisted herslef to sit EOB for a 10 minute interval with conversation, however did not let me assist or touch LLE on off bed. Moaning a times with LLE pain . Will need hopsital bed, WC and hoyer lift and transport if DC home.    Follow Up Recommendations  SNF     Equipment Recommendations   (if home, pt will need transport, hopsital bed, hoyer lift, WC)    Recommendations for Other Services       Precautions / Restrictions Precautions Precautions: Fall Precaution Comments: AMS, severe pain of both legs, can not even touch a foot, exp aphasia    Mobility  Bed Mobility Overal bed mobility: Needs Assistance Bed Mobility: Supine to Sit;Sit to Supine     Supine to sit: HOB elevated;Min assist Sit to supine: Min assist;HOB elevated   General bed mobility comments: Pt assisted and scooted herself to the EOB for sittng EOB for about 10 minutes with slight assistance. tolerated sitting EOB very well today. Was having covnersation that was 80% inteligible due to apasia at  times  Transfers                    Ambulation/Gait                 Stairs             Wheelchair Mobility    Modified Rankin (Stroke Patients Only)       Balance Overall balance assessment: Needs assistance Sitting-balance support: Single extremity supported Sitting balance-Leahy Scale: Fair Sitting balance - Comments: pt sat EOB and scooted her hips to sit more upright with very little assistance                                    Cognition Arousal/Alertness: Awake/alert Behavior During Therapy: Restless Overall Cognitive Status: No family/caregiver present to determine baseline cognitive functioning                                        Exercises      General Comments        Pertinent Vitals/Pain Pain Assessment: Faces Faces Pain Scale: Hurts a little bit Pain Location: pt rubs her Left leg so appears to be the most painful one but could not get her to tell me , some moaning, but not as bad as previos visits per notes Pain Descriptors / Indicators: Moaning;Grimacing    Home Living  Prior Function            PT Goals (current goals can now be found in the care plan section) Acute Rehab PT Goals Patient Stated Goal: none stated PT Goal Formulation: Patient unable to participate in goal setting Time For Goal Achievement: 06/26/20 Potential to Achieve Goals: Fair Progress towards PT goals: Progressing toward goals    Frequency    Min 2X/week      PT Plan Current plan remains appropriate    Co-evaluation              AM-PAC PT "6 Clicks" Mobility   Outcome Measure  Help needed turning from your back to your side while in a flat bed without using bedrails?: A Little Help needed moving from lying on your back to sitting on the side of a flat bed without using bedrails?: A Little Help needed moving to and from a bed to a chair (including a wheelchair)?:  Total Help needed standing up from a chair using your arms (e.g., wheelchair or bedside chair)?: Total Help needed to walk in hospital room?: Total Help needed climbing 3-5 steps with a railing? : Total 6 Click Score: 10    End of Session   Activity Tolerance: Patient limited by pain Patient left: in bed;with call bell/phone within reach;with bed alarm set Nurse Communication: Need for lift equipment PT Visit Diagnosis: Muscle weakness (generalized) (M62.81);Other symptoms and signs involving the nervous system (R29.898);Pain;Other abnormalities of gait and mobility (R26.89) Pain - part of body: Ankle and joints of foot     Time: 1430-1454 PT Time Calculation (min) (ACUTE ONLY): 24 min  Charges:  $Therapeutic Activity: 8-22 mins                     Elric Tirado, PT, MPT Acute Rehabilitation Services Office: [phone] Pager: [phone] 06/13/2020    Clide Dales 06/13/2020, 3:31 PM

## 2020-06-13 NOTE — Progress Notes (Signed)
PROGRESS NOTE    [name]  HYQ:[id number] DOB: 05-Jul-1946 DOA: 06/03/2020 PCP: [name], MD    Chief Complaint  Patient presents with  . Fever    Brief Narrative:  74 year old female with history of CAD s/p stents, severe AS s/p AVR, OSA/COPD/chronic RF with as needed O2, diastolic CHF, A. Fib s/p maze on warfarin, DM-2, ESRD s/p transplant now with CKD-4, adenocarcinoma of lung, embolic CVA with residual aphasia and BLE nonhealing wounds s/p abdominal aortogram on 11/12 found to have fever, tachypnea, tachycardia with difficulty clearing secretions after she called EMS for epistaxis.  Patient was admitted for sepsis due to LLE purulent cellulitis and Proteus UTI, and A. fib with RVR, acute on chronic diastolic CHF and acute on chronic hypoxemic RF.  Empirically treated with vancomycin and meropenem which were narrowed to Zyvox.  Also treated with IV Lasix for CHF exacerbation.  Cardiology and palliative medicine following.  PMT planning to meet with family today to discuss goals of care.   Assessment & Plan:   Principal Problem:   Sepsis ([place]) Active Problems:   Type 2 diabetes mellitus with chronic kidney disease, with long-term current use of insulin (HCC)   Hyperlipidemia LDL goal <70   Renal transplant recipient   S/P aortic valve replacement with bioprosthetic valve and maze procedure   Acute on chronic diastolic CHF (congestive heart failure) (HCC)   History of stroke   Venous stasis ulcers of both lower extremities (HCC)   ESRD (end stage renal disease) (HCC)   Atrial fibrillation with rapid ventricular response ([place])   Pulmonary hypertension, unspecified ([place])   Palliative care by specialist   Goals of care, counseling/discussion   General weakness   Cellulitis of both lower extremities   Ulcer of left lower extremity with fat layer exposed ([place])   Urinary tract infection without hematuria   1 sepsis secondary to left lower extremity purulent  cellulitis/infected nonhealing wounds due to PAD and possible Proteus UTI, POA Patient pancultured.  Blood cultures with no growth to date.  Urine cultures with 10,000 colonies of Proteus Mirabilis.  2D echo which was done did not show a large vegetation.  Patient noted to have had an abdominal aortogram on 05/30/2020 that showed heavily calcified bilateral proximal arteries with flush occlusion of bilateral superficial femoral artery.  CT left lower extremity with open ulceration with cellulitis and myofascitis. -Per vascular surgery amputation will be next consideration if pain was unbearable.  Palliative care following and helping manage patient's current pain as well as goals of care. -Was on vancomycin and meropenem and has been transitioned to Zyvox.  Transition from IV Zyvox to oral Zyvox d8/10-14. -Norvasc discontinued secondary to edema. -We will need outpatient follow-up with vascular surgery and wound care. -Follow.  2.  Acute on chronic hypoxemic respiratory failure secondary to acute on chronic diastolic CHF exacerbation 2D echo done with EF of 55 to 60%, right wall motion abnormalities, RVSP 73.4, severe LAE, moderate RAE, moderate to severe MVR, severe mitral annular calcification and severe TR.  Patient was on IV Lasix and has been transitioned to oral torsemide which she seems to be tolerating.  Clinical improvement.  Urine output of 700 cc over the past 24 hours.  Transition from IV Lopressor to oral Lopressor.  Cardiology was following.   3.  Moderate to severe mitral valve regurgitation/severe tricuspid valvular regurgitation Per cardiology not a candidate for surgical intervention at this time.  Continue diuretics and cardiac medications.  Outpatient follow-up with  cardiology.  4.  History of severe AI status post AVR with prosthetic valve Stable.  Per cardiology.  5.  A. fib with RVR Currently rate controlled on IV Lopressor.  Patient has been refusing oral medications early  on during the hospitalization however now in agreement with taking oral medications.  Discontinue IV Lopressor and place back on oral Lopressor as recommended per cardiology.  On heparin drip will transition to full dose Lovenox.  Coumadin resumed 06/12/2020.  Follow.  6.  History of coronary artery disease status post stents Continue beta-blocker, anticoagulation.  Per cardiology.  7.  History of embolic CVA with residual expressive aphasia CT head done during this hospitalization showed prior infarct at the junction of the left temporoparietal and occipital lobes.  No acute infarct noted.  Patient being followed by speech therapy.  Continue risk factor modification, beta-blocker, on IV heparin and Coumadin.  INR subtherapeutic.  Discontinue IV heparin and placed on full dose Lovenox.  Likely resume statin on discharge.  Supportive care.  Follow.    8.  Abdominal pain CT abdomen and pelvis without contrast revealed new body wall edema.  Recent arteriogram does not look like ischemic bowel.  Improving.  9.  History of end-stage renal disease status post renal transplant now with chronic kidney disease stage IV Stable.  Continue low-dose prednisone and tacrolimus.  Diet liberated due to poor oral intake.  Outpatient follow-up with nephrology.  10.  Uncontrolled type 2 diabetes mellitus Hemoglobin A1c 9.2 (06/03/2020 ).  CBG 231 this morning.  Increase Lantus to 14 units daily and uptitrate as needed for better blood glucose control.  Sliding scale insulin.   11.  Hypertension Stable.  Continue current regimen of torsemide and metoprolol.  Follow.    12.  Left upper extremity swelling Left upper extremity Dopplers negative for DVT.  Outpatient follow-up.  13.  Hyperlipidemia Statin on hold.  Likely resume statin on discharge.  14.  Goals of care Patient with multiple comorbidities.  Patient with a poor prognosis.  Patient full code.  Palliative care following and discussing with  family.  15.  Inadequate oral intake Patient states improvement with oral intake.  Continue nutritional supplementation.  DVT prophylaxis: Lovenox bridge/Coumadin  Code Status: Full Family Communication: Updated patient.  Updated husband at bedside.   Disposition:   Status is: Inpatient    Dispo: The patient is from: Home              Anticipated d/c is to: SNF for palliative care following versus home with home health.              Anticipated d/c date is: To be determined.              Patient currently being diuresed due to CHF exacerbation, has arterial ulcers, on IV antibiotics, INR not therapeutic, not medically stable for discharge.       Consultants:   Wound care nurse Domenic Moras, FNP 06/03/2020  Cardiology: Dr. Oval Linsey 06/03/2020  Palliative care: Dr. Domingo Cocking 06/04/2020    Procedures:   CT left tib-fib 06/05/2020  CT abdomen and pelvis 06/03/2020  CT head 06/03/2020  Chest x-ray 06/03/2020  2D echo 06/03/2020  Left upper extremity Dopplers 06/07/2020  Antimicrobials:   Oral Zyvox 06/06/2020>>> 06/10/2020  Oral Flagyl 06/04/2020>>>> 06/05/2020  IV Ancef 06/05/2020 x1 dose  IV cefepime 06/03/2020 x 1 dose  IV Rocephin 06/04/2020>>>> 06/05/2020  IV Zyvox 06/04/2020 >>> 06/05/2020, 06/10/2020>>>  IV Merrem 06/03/2020>>> 06/04/2020  IV Zosyn 06/03/2020  x 1 dose  IV vancomycin 06/03/2020 x 1 dose   Subjective: Patient sitting up in bed.  Awake alert.  Husband at bedside.  Denies any chest pain or shortness of breath.  States oral intake is improving.  Wants to go home.   Objective: Vitals:   06/12/20 1416 06/12/20 2003 06/13/20 0403 06/13/20 0412  BP: (!) 142/84 (!) 149/73  (!) 143/88  Pulse: 92 100  (!) 106  Resp: 20 18  20   Temp: 98.2 F (36.8 C) 98.2 F (36.8 C)  98.5 F (36.9 C)  TempSrc: Oral     SpO2: 97% 92%  93%  Weight:   78.8 kg   Height:        Intake/Output Summary (Last 24 hours) at 06/13/2020 1034 Last data  filed at 06/13/2020 0815 Gross per 24 hour  Intake 3072.95 ml  Output 2000 ml  Net 1072.95 ml   Filed Weights   06/11/20 0500 06/12/20 0500 06/13/20 0403  Weight: 81.5 kg 81.5 kg 78.8 kg    Examination:  General exam: NAD Respiratory system: Lungs clear to auscultation bilaterally.  No wheezes, no crackles, no rhonchi.  Normal respiratory effort.  Speaking in full sentences. Cardiovascular system: Irregularly irregular.  No murmurs rubs or gallops.  No JVD.  No lower extremity edema.   Gastrointestinal system: Abdomen is soft, nontender, nondistended, positive bowel sounds.  No rebound.  No guarding.  Central nervous system: Alert and oriented. No focal neurological deficits. Extremities: Left lower extremity bandaged.  Left upper extremity swelling.  Symmetric 5 x 5 power. Skin: No rashes, lesions or ulcers Psychiatry: Judgement and insight appear normal. Mood & affect appropriate.     Data Reviewed: I have personally reviewed following labs and imaging studies  CBC: Recent Labs  Lab 06/09/20 0418 06/10/20 0414 06/11/20 0453 06/12/20 0358 06/13/20 0439  WBC 12.1* 9.7 11.2* 11.7* 11.0*  NEUTROABS 10.2* 8.1*  --   --   --   HGB 11.1* 11.3* 11.0* 10.5* 10.9*  HCT 36.7 37.7 36.5 33.7* 35.0*  MCV 77.9* 78.2* 79.0* 77.5* 77.3*  PLT 219 220 219 212 258    Basic Metabolic Panel: Recent Labs  Lab 06/08/20 0350 06/08/20 0350 06/09/20 0418 06/10/20 0414 06/11/20 0453 06/12/20 0358 06/13/20 0439  NA 141   < > 137 139 138 137 137  K 3.5   < > 4.0 3.8 3.7 3.6 3.6  CL 97*   < > 95* 96* 97* 96* 96*  CO2 29   < > 31 30 29  32 31  GLUCOSE 108*   < > 276* 168* 144* 247* 216*  BUN 39*   < > 43* 46* 42* 50* 51*  CREATININE 2.48*   < > 2.67* 2.66* 2.24* 2.52* 2.42*  CALCIUM 9.5   < > 9.6 9.9 9.8 9.7 10.1  MG 2.1  --  2.0 1.9 2.0  --   --   PHOS  --   --  3.7 2.8 2.9 3.2 2.9   < > = values in this interval not displayed.    GFR: Estimated Creatinine Clearance: 18.8 mL/min  (A) (by C-G formula based on SCr of 2.42 mg/dL (H)).  Liver Function Tests: Recent Labs  Lab 06/08/20 0350 06/08/20 0350 06/09/20 0418 06/10/20 0414 06/11/20 0453 06/12/20 0358 06/13/20 0439  AST 54*  --   --   --   --   --   --   ALT 18  --   --   --   --   --   --  ALKPHOS 672*  --   --   --   --   --   --   BILITOT 1.0  --   --   --   --   --   --   PROT 6.4*  --   --   --   --   --   --   ALBUMIN 2.4*   < > 2.3* 2.4* 2.3* 2.3* 2.5*   < > = values in this interval not displayed.    CBG: Recent Labs  Lab 06/12/20 0736 06/12/20 1132 06/12/20 1628 06/12/20 2144 06/13/20 0716  GLUCAP 233* 275* 243* 240* 231*     Recent Results (from the past 240 hour(s))  MRSA PCR Screening     Status: Abnormal   Collection Time: 06/03/20  2:00 PM   Specimen: Nasal Mucosa; Nasopharyngeal  Result Value Ref Range Status   MRSA by PCR POSITIVE (A) NEGATIVE Final    Comment:        The GeneXpert MRSA Assay (FDA approved for NASAL specimens only), is one component of a comprehensive MRSA colonization surveillance program. It is not intended to diagnose MRSA infection nor to guide or monitor treatment for MRSA infections. RESULT CALLED TO, READ BACK BY AND VERIFIED WITH: HEAVNER,L. RN @1848  06/03/20 BILLINGSLEY,L Performed at [facility], [place] [address] [place], [place] [address]          Radiology Studies: No results found.      Scheduled Meds: . acetaminophen  650 mg Oral Q6H  . collagenase   Topical Daily  . feeding supplement  237 mL Oral TID BM  . fentaNYL  1 patch Transdermal Q72H  . insulin aspart  0-5 Units Subcutaneous QHS  . insulin aspart  0-6 Units Subcutaneous TID WC  . insulin aspart  2 Units Subcutaneous TID WC  . insulin glargine  14 Units Subcutaneous Daily  . levothyroxine  25 mcg Intravenous Daily  . metoprolol tartrate  2.5 mg Intravenous Q6H  . predniSONE  5 mg Oral QAC breakfast  . sodium chloride flush  3 mL  Intravenous Q12H  . tacrolimus  3 mg Oral BID  . torsemide  100 mg Oral Daily  . warfarin  4 mg Oral ONCE-1600  . Warfarin - Pharmacist Dosing Inpatient   Does not apply q1600   Continuous Infusions: . heparin 1,650 Units/hr (06/13/20 0540)  . linezolid (ZYVOX) IV 600 mg (06/13/20 0559)     LOS: 10 days    Time spent: 40 minutes    Irine Seal, MD [facility]   To contact the attending provider between 7A-7P or the covering provider during after hours 7P-7A, please log into the web site [email or web address] and access using universal [place] password for that web site. If you do not have the password, please call the hospital operator.  06/13/2020, 10:34 AM

## 2020-06-13 NOTE — Progress Notes (Signed)
Daily Progress Note   Patient Name: [name]       Date: 06/13/2020 DOB: [birth year]  Age: 74 y.o. MRN#: [id number] Attending Physician: Eugenie Filler, MD Primary Care Physician: [name], MD Admit Date: 06/03/2020  Reason for Consultation/Follow-up: Establishing goals of care and Pain control  Subjective:  Patient is awake alert, PO intake is adequate, denies complaints, states she wants to go home towards the end of this hospitalization, husband at bedside.        Length of Stay: 10  Current Medications: Scheduled Meds:  . acetaminophen  650 mg Oral Q6H  . collagenase   Topical Daily  . enoxaparin (LOVENOX) injection  1 mg/kg Subcutaneous Q24H  . feeding supplement  237 mL Oral TID BM  . fentaNYL  1 patch Transdermal Q72H  . insulin aspart  0-5 Units Subcutaneous QHS  . insulin aspart  0-6 Units Subcutaneous TID WC  . insulin aspart  2 Units Subcutaneous TID WC  . insulin glargine  14 Units Subcutaneous Daily  . levothyroxine  25 mcg Intravenous Daily  . metoprolol tartrate  25 mg Oral BID  . predniSONE  5 mg Oral QAC breakfast  . sodium chloride flush  3 mL Intravenous Q12H  . tacrolimus  3 mg Oral BID  . torsemide  100 mg Oral Daily  . warfarin  4 mg Oral ONCE-1600  . Warfarin - Pharmacist Dosing Inpatient   Does not apply q1600    Continuous Infusions: . linezolid (ZYVOX) IV 600 mg (06/13/20 0559)    PRN Meds: HYDROmorphone (DILAUDID) injection, oxyCODONE, polyethylene glycol  Physical Exam    General:  awake alert responds appropriately.  HEENT: No bruits, no goiter, no JVD Heart: Regular rate and rhythm. No murmur appreciated. Lungs: Good air movement, clear Abdomen: Soft, nontender, nondistended, positive bowel sounds.  Skin: Warm and  dry Neuro:  more alert today      Ext:  LLE has 2 ulcers on shin, also has blister and ulcer on dorsum of foot, dressing taken down and leg examined along with TRH MD today.    Vital Signs: BP 119/63   Pulse 92   Temp 98.5 F (36.9 C)   Resp 20   Ht 5\' 1"  (1.549 m)   Wt 78.8 kg   SpO2 93%   BMI  32.82 kg/m  SpO2: SpO2: 93 % O2 Device: O2 Device: Nasal Cannula O2 Flow Rate: O2 Flow Rate (L/min): 2 L/min  Intake/output summary:   Intake/Output Summary (Last 24 hours) at 06/13/2020 1206 Last data filed at 06/13/2020 0815 Gross per 24 hour  Intake 3072.95 ml  Output 2000 ml  Net 1072.95 ml   LBM: Last BM Date: 06/11/20 Baseline Weight: Weight: 82 kg Most recent weight: Weight: 78.8 kg       Palliative Assessment/Data:    Flowsheet Rows     Most Recent Value  Intake Tab  Referral Department Hospitalist  Unit at Time of Referral Med/Surg Unit  Date Notified 06/03/20  Palliative Care Type New Palliative care  Reason for referral Clarify Goals of Care  Date of Admission 06/03/20  Date first seen by Palliative Care 06/04/20  # of days Palliative referral response time 1 Day(s)  # of days IP prior to Palliative referral 0  Clinical Assessment  Psychosocial & Spiritual Assessment  Palliative Care Outcomes      Patient Active Problem List   Diagnosis Date Noted  . Cellulitis of both lower extremities   . Ulcer of left lower extremity with fat layer exposed ([place])   . Urinary tract infection without hematuria   . Palliative care by specialist   . Goals of care, counseling/discussion   . General weakness   . Pulmonary hypertension, unspecified ([place])   . Atrial fibrillation with rapid ventricular response ([place]) 06/03/2020  . Sepsis ([place]) 06/03/2020  . ESRD (end stage renal disease) ([place])   . Venous stasis ulcers of both lower extremities ([place]) 01/31/2020  . Venous stasis dermatitis of both lower extremities 01/31/2020  . Claudication of both lower extremities ([place])  01/31/2020  . Chronic venous insufficiency 12/31/2019  . Heart failure ([place]) 10/02/2019  . Dyspnea 08/20/2019  . COPD (chronic obstructive pulmonary disease) ([place]) 08/20/2019  . Hematoma of arm, left, subsequent encounter 07/23/2019  . Obesity (BMI 30-39.9) 12/20/2018  . Acute respiratory failure with hypoxia ([place]) 12/19/2018  . Gait disorder 11/22/2018  . History of seizure disorder 09/12/2018  . History of stroke 09/12/2018  . Edema 08/28/2018  . Hypertension secondary to other renal disorders 03/22/2018  . Proteinuria 03/22/2018  . Malignant neoplasm of lung ([place]) 03/22/2018  . Breast pain in female 02/04/2018  . Lung cancer ([place]) 11/30/2017  . Adenocarcinoma of lung, stage 1, right ([place]) 11/25/2017  . Chronic respiratory failure with hypoxia ([place]) 12/27/2016  . Acute on chronic diastolic CHF (congestive heart failure) ([place]) 12/27/2016  . Left upper extremity swelling 12/27/2016  . Pain of left breast 12/27/2016  . Arm muscle atrophy 11/16/2016  . Insomnia 02/25/2016  . Renal cyst 11/21/2015  . Renal mass, right 11/10/2015  . Shoulder pain, right 08/13/2015  . Weight gain 08/12/2014  . Multinodular goiter 05/01/2014  . Encounter for therapeutic drug monitoring 08/14/2013  . S/P aortic valve replacement with bioprosthetic valve and maze procedure 04/12/2013  . Nodule of right lung 04/07/2013  . Nodule of left lung 04/07/2013  . Pain in limb 05/22/2012  . Dermatitis 01/26/2012  . Long term (current) use of anticoagulants 08/17/2011  . Anemia in chronic renal disease 05/13/2011  . Hypokalemia 04/26/2011  . Immunosuppression ([place]) 04/26/2011  . Hypertension associated with diabetes ([place]) 04/26/2011  . Renal transplant recipient 03/16/2011  . Low back pain 02/23/2011  . Hypersalivation 11/24/2010  . AORTIC STENOSIS 08/20/2010  . DISTURBANCE OF SALIVARY SECRETION 07/28/2010  . NAUSEA 04/14/2010  . SMOKER 10/07/2009  .  ALOPECIA 10/07/2009  . CAROTID STENOSIS 03/04/2009  .  AF (paroxysmal atrial fibrillation) ([place]) 12/31/2008  . NEOPLASM, MALIGNANT, KIDNEY 07/02/2008  . APHASIA DUE TO CEREBROVASCULAR DISEASE 07/02/2008  . Chronic fatigue 05/21/2008  . Hyperlipidemia LDL goal <70 09/27/2007  . Constipation 09/27/2007  . SLEEP APNEA 09/27/2007  . CHOLELITHIASIS 06/09/2007  . HELICOBACTER PYLORI INFECTION, HX OF 06/08/2007  . Type 2 diabetes mellitus with chronic kidney disease, with long-term current use of insulin ([place]) 05/23/2007  . Gout 05/23/2007  . 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 MYOCARDIAL INFARCTION, HX OF 05/23/2007  . Coronary atherosclerosis 05/23/2007  . GERD 05/23/2007  . COLONIC POLYPS, HX OF 05/23/2007  . DIVERTICULOSIS, COLON 07/01/2005    Palliative Care Assessment & Plan   Patient Profile: 74 y.o. female  with past medical history of CAD status post stent, severe aortic stenosis status post AVR, hypertension, hyperlipidemia, OSA, COPD with intermittent O2 requirement, HFpEF, atrial fibrillation status post maze on Coumadin, type 2 diabetes, end-stage renal disease status post transplant, CKD 4, adenocarcinoma of the lung, embolic CVA with residual aphasia, bilateral lower extremity nonhealing wounds and recent abdominal aortogram with out actionable findings (would likely need eventual AKA) admitted on 06/03/2020 with sepsis, concern for endocarditis, A. fib and worsening renal failure.  Palliative consulted for goals of care.  Assessment: Patient Active Problem List   Diagnosis Date Noted  . Cellulitis of both lower extremities   . Ulcer of left lower extremity with fat layer exposed ([place])   . Urinary tract infection without hematuria   . Palliative care by specialist   . Goals of care, counseling/discussion   . General weakness   . Pulmonary hypertension, unspecified ([place])   . Atrial fibrillation with rapid ventricular response ([place]) 06/03/2020  . Sepsis ([place]) 06/03/2020  . ESRD (end stage renal disease) ([place])     . Venous stasis ulcers of both lower extremities ([place]) 01/31/2020  . Venous stasis dermatitis of both lower extremities 01/31/2020  . Claudication of both lower extremities ([place]) 01/31/2020  . Chronic venous insufficiency 12/31/2019  . Heart failure ([place]) 10/02/2019  . Dyspnea 08/20/2019  . COPD (chronic obstructive pulmonary disease) ([place]) 08/20/2019  . Hematoma of arm, left, subsequent encounter 07/23/2019  . Obesity (BMI 30-39.9) 12/20/2018  . Acute respiratory failure with hypoxia ([place]) 12/19/2018  . Gait disorder 11/22/2018  . History of seizure disorder 09/12/2018  . History of stroke 09/12/2018  . Edema 08/28/2018  . Hypertension secondary to other renal disorders 03/22/2018  . Proteinuria 03/22/2018  . Malignant neoplasm of lung ([place]) 03/22/2018  . Breast pain in female 02/04/2018  . Lung cancer ([place]) 11/30/2017  . Adenocarcinoma of lung, stage 1, right ([place]) 11/25/2017  . Chronic respiratory failure with hypoxia ([place]) 12/27/2016  . Acute on chronic diastolic CHF (congestive heart failure) ([place]) 12/27/2016  . Left upper extremity swelling 12/27/2016  . Pain of left breast 12/27/2016  . Arm muscle atrophy 11/16/2016  . Insomnia 02/25/2016  . Renal cyst 11/21/2015  . Renal mass, right 11/10/2015  . Shoulder pain, right 08/13/2015  . Weight gain 08/12/2014  . Multinodular goiter 05/01/2014  . Encounter for therapeutic drug monitoring 08/14/2013  . S/P aortic valve replacement with bioprosthetic valve and maze procedure 04/12/2013  . Nodule of right lung 04/07/2013  . Nodule of left lung 04/07/2013  . Pain in limb 05/22/2012  . Dermatitis 01/26/2012  . Long term (current) use of anticoagulants 08/17/2011  . Anemia in chronic renal disease 05/13/2011  .  Hypokalemia 04/26/2011  . Immunosuppression ([place]) 04/26/2011  . Hypertension associated with diabetes ([place]) 04/26/2011  . Renal transplant recipient 03/16/2011  . Low back pain 02/23/2011  . Hypersalivation 11/24/2010   . AORTIC STENOSIS 08/20/2010  . DISTURBANCE OF SALIVARY SECRETION 07/28/2010  . NAUSEA 04/14/2010  . SMOKER 10/07/2009  . ALOPECIA 10/07/2009  . CAROTID STENOSIS 03/04/2009  . AF (paroxysmal atrial fibrillation) ([place]) 12/31/2008  . NEOPLASM, MALIGNANT, KIDNEY 07/02/2008  . APHASIA DUE TO CEREBROVASCULAR DISEASE 07/02/2008  . Chronic fatigue 05/21/2008  . Hyperlipidemia LDL goal <70 09/27/2007  . Constipation 09/27/2007  . SLEEP APNEA 09/27/2007  . CHOLELITHIASIS 06/09/2007  . HELICOBACTER PYLORI INFECTION, HX OF 06/08/2007  . Type 2 diabetes mellitus with chronic kidney disease, with long-term current use of insulin ([place]) 05/23/2007  . Gout 05/23/2007  . 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 MYOCARDIAL INFARCTION, HX OF 05/23/2007  . Coronary atherosclerosis 05/23/2007  . GERD 05/23/2007  . COLONIC POLYPS, HX OF 05/23/2007  . DIVERTICULOSIS, COLON 07/01/2005    Recommendations/Plan: - Pain: fentanyl patch 25 mcg/h  and scheduled on Tylenol 650 four times also has oxycodone 5 mg every 4 hours as needed for breakthrough pain.  Full code, full scope for now.  11-24:Call placed and discussed with husband, he states that their 2 daughters are arriving from out of town tonight, family to continue discussions regarding code status and next steps, PMT to follow.  Family to gather for the holiday today and discuss with each other, PMT will follow up with them on 11-26, overall, patient with more awake ness and alertness and slightly increased PO intake since the past 24-48 hours or so. Ongoing discussions with family regarding code status and disposition options.  06-13-2020: Re discussed code status and goals of care with patient and husband who was at bedside, along with [name] MD colleague Dr Grandville Silos, we talked about patient's current hospitalization and her underlying co morbidities.  Plan: Remains full code Home on discharge Home with home health, maximize home based  resources Patient and husband accepting of home based out patient palliative care follow up They will follow up with vascular surgery and wound care in the outpatient setting. Goals, at this current point in time are NOT exclusively for comfort-care, husband is aware of the overall serious nature of the patient's illness.    Goals of Care and Additional Recommendations:  Limitations on Scope of Treatment: Full Scope Treatment  Code Status:    Code Status Orders  (From admission, onward)         Start     Ordered   06/03/20 0919  Full code  Continuous        06/03/20 0918        Code Status History    Date Active Date Inactive Code Status Order ID Comments User Context   05/30/2020 1320 05/30/2020 2355 Full Code [id number]  Elam Dutch, MD Inpatient   10/01/2019 2327 10/11/2019 1851 Full Code [id number]  Etta Quill, DO ED   12/19/2018 2042 12/26/2018 1751 Full Code [id number]  [place] Eric J, DO Inpatient   02/04/2018 0244 02/08/2018 1925 Full Code [id number]  Vianne Bulls, MD ED   12/27/2016 2146 01/01/2017 1532 Full Code [id number]  Ivor Costa, MD ED   04/12/2013 1330 04/23/2013 1855 Full Code [id number]  Rexene Alberts, MD Inpatient   Advance Care Planning Activity    Advance Directive Documentation     Most Recent Value  Type of Advance Directive  Healthcare Power of Attorney  Pre-existing out of facility DNR order (yellow form or pink MOST form) --  "MOST" Form in Place? --       Prognosis:   Unable to determine  Discharge Planning:  To be determined.   Care plan was discussed with IDT Thank you for allowing the Palliative Medicine Team to assist in the care of this patient.   Time In: 10 Time Out: 10.35 Total Time 35 Prolonged Time Billed No  Greater than 50%  of this time was spent counseling and coordinating care related to the above assessment and plan.  Loistine Chance, MD  Please contact Palliative Medicine Team phone at [phone] for questions and  concerns.

## 2020-06-13 NOTE — Progress Notes (Signed)
Spoke with Sharyn Lull in pharmacy, patients heparin level  0.74. No bleeding noted and heparin being infused at 86mL. New orders entered from pharmacy. Second IV access request for IV team so IV antibiotics can be administered. Will continue to monitor the patient.

## 2020-06-14 DIAGNOSIS — I4891 Unspecified atrial fibrillation: Secondary | ICD-10-CM | POA: Diagnosis not present

## 2020-06-14 DIAGNOSIS — N390 Urinary tract infection, site not specified: Secondary | ICD-10-CM | POA: Diagnosis not present

## 2020-06-14 DIAGNOSIS — I5033 Acute on chronic diastolic (congestive) heart failure: Secondary | ICD-10-CM | POA: Diagnosis not present

## 2020-06-14 DIAGNOSIS — A419 Sepsis, unspecified organism: Secondary | ICD-10-CM | POA: Diagnosis not present

## 2020-06-14 LAB — BASIC METABOLIC PANEL
Anion gap: 10 (ref 5–15)
BUN: 50 mg/dL — ABNORMAL HIGH (ref 8–23)
CO2: 32 mmol/L (ref 22–32)
Calcium: 10.1 mg/dL (ref 8.9–10.3)
Chloride: 97 mmol/L — ABNORMAL LOW (ref 98–111)
Creatinine, Ser: 2.53 mg/dL — ABNORMAL HIGH (ref 0.44–1.00)
GFR, Estimated: 19 mL/min — ABNORMAL LOW (ref >60)
Glucose, Bld: 183 mg/dL — ABNORMAL HIGH (ref 70–99)
Potassium: 3.6 mmol/L (ref 3.5–5.1)
Sodium: 139 mmol/L (ref 135–145)

## 2020-06-14 LAB — CBC
HCT: 34.8 % — ABNORMAL LOW (ref 36.0–46.0)
Hemoglobin: 10.6 g/dL — ABNORMAL LOW (ref 12.0–15.0)
MCH: 23.8 pg — ABNORMAL LOW (ref 26.0–34.0)
MCHC: 30.5 g/dL (ref 30.0–36.0)
MCV: 78.2 fL — ABNORMAL LOW (ref 80.0–100.0)
Platelets: 194 10*3/uL (ref 150–400)
RBC: 4.45 MIL/uL (ref 3.87–5.11)
RDW: 19.6 % — ABNORMAL HIGH (ref 11.5–15.5)
WBC: 10.1 10*3/uL (ref 4.0–10.5)
nRBC: 0.2 % (ref 0.0–0.2)

## 2020-06-14 LAB — PROTIME-INR
INR: 1.2 (ref 0.8–1.2)
Prothrombin Time: 14.3 seconds (ref 11.4–15.2)

## 2020-06-14 LAB — GLUCOSE, CAPILLARY
Glucose-Capillary: 156 mg/dL — ABNORMAL HIGH (ref 70–99)
Glucose-Capillary: 231 mg/dL — ABNORMAL HIGH (ref 70–99)
Glucose-Capillary: 179 mg/dL — ABNORMAL HIGH (ref 70–99)
Glucose-Capillary: 124 mg/dL — ABNORMAL HIGH (ref 70–99)

## 2020-06-14 MED ORDER — LEVOTHYROXINE SODIUM 50 MCG PO TABS
50.0000 ug | ORAL_TABLET | Freq: Every day | ORAL | Status: DC
  Administered 2020-06-14 – 2020-06-16 (×3): 50 ug via ORAL
  Filled 2020-06-14 (×3): qty 1

## 2020-06-14 MED ORDER — WARFARIN SODIUM 5 MG PO TABS
5.0000 mg | ORAL_TABLET | Freq: Once | ORAL | Status: AC
  Administered 2020-06-14: 5 mg via ORAL
  Filled 2020-06-14: qty 1

## 2020-06-14 NOTE — Progress Notes (Signed)
[place] for warfarin Indication: hx bioprosthetic AVR, stroke, afib  Allergies  Allergen Reactions  . Ibuprofen Nausea And Vomiting  . Sulfamethoxazole-Trimethoprim Itching, Swelling and Rash    Swelling of the face  . Sulfonamide Derivatives Itching, Swelling and Rash    Swelling of the face  . Tape Rash    Paper tape is ok  . Tramadol Nausea And Vomiting  . Doxycycline Nausea Only  . Hydrocil [Psyllium] Nausea And Vomiting  . Bactrim Itching, Swelling and Rash  . Red Dye Itching and Rash   Patient Measurements: Height: 5\' 1"  (154.9 cm) Weight: 78.8 kg (173 lb 11.6 oz) IBW/kg (Calculated) : 47.8 Heparin Dosing Weight: 66.4 kg  Vital Signs: Temp: 98 F (36.7 C) (11/27 0636) Temp Source: Oral (11/27 0636) BP: 156/75 (11/27 0636) Pulse Rate: 85 (11/27 0636)  Labs: Recent Labs    06/12/20 0358 06/12/20 0358 06/12/20 1400 06/13/20 0439 06/14/20 0412  HGB 10.5*   < >  --  10.9* 10.6*  HCT 33.7*  --   --  35.0* 34.8*  PLT 212  --   --  209 194  LABPROT  --   --  14.8 14.2 14.3  INR  --   --  1.2 1.1 1.2  HEPARINUNFRC 0.38  --   --  0.74*  --   CREATININE 2.52*  --   --  2.42* 2.53*   < > = values in this interval not displayed.    Estimated Creatinine Clearance: 18 mL/min (A) (by C-G formula based on SCr of 2.53 mg/dL (H)).   Medications:  - on warfarin PTA  Assessment: Patient is a 74 y.o F with hx bioprosthetic AVR, stroke, and afib on warfarin PTA presented to the ED on 11/16 with fever. Pharmacy is consulted to transition to heparin drip on admission.  Pt home warfarin regimen is warfarin 2.5 mg PO daily except 5mg  on thursdays   Today, 06/14/2020:  Heparin  has been changed to enoxaparin, currently 1 mg/kg q24h for CrcCl < 30 ml/min   CBC stable  SCr 2.53, CrCl ~ 18 ml/min    No bleeding issues noted per nursing  Patient eating 50-85% of meal tray + drinking Ensure x3 daily  No major drug-drug  interactions noted  Goal of Therapy:  Heparin level 0.3-0.7 units/ml Monitor platelets by anticoagulation protocol: Yes  Target INR 2-3 per Warfarin clinic notes 05/23/2020   Plan:   Enoxaparin 80 mg SQ q24h   Warfarin 5 mg PO x1 today   Daily heparin level, CBC  Monitor closely for s/sx of bleeding   Royetta Asal, PharmD, BCPS 06/14/2020 8:18 AM

## 2020-06-14 NOTE — Progress Notes (Signed)
PROGRESS NOTE    [name]  ZOX:[id number] DOB: 03/13/1946 DOA: 06/03/2020 PCP: [name], MD    Chief Complaint  Patient presents with  . Fever    Brief Narrative:  74 year old female with history of CAD s/p stents, severe AS s/p AVR, OSA/COPD/chronic RF with as needed O2, diastolic CHF, A. Fib s/p maze on warfarin, DM-2, ESRD s/p transplant now with CKD-4, adenocarcinoma of lung, embolic CVA with residual aphasia and BLE nonhealing wounds s/p abdominal aortogram on 11/12 found to have fever, tachypnea, tachycardia with difficulty clearing secretions after she called EMS for epistaxis.  Patient was admitted for sepsis due to LLE purulent cellulitis and Proteus UTI, and A. fib with RVR, acute on chronic diastolic CHF and acute on chronic hypoxemic RF.  Empirically treated with vancomycin and meropenem which were narrowed to Zyvox.  Also treated with IV Lasix for CHF exacerbation.  Cardiology and palliative medicine following.  PMT planning to meet with family today to discuss goals of care.   Assessment & Plan:   Principal Problem:   Sepsis ([place]) Active Problems:   Type 2 diabetes mellitus with chronic kidney disease, with long-term current use of insulin (HCC)   Hyperlipidemia LDL goal <70   Renal transplant recipient   S/P aortic valve replacement with bioprosthetic valve and maze procedure   Acute on chronic diastolic CHF (congestive heart failure) (HCC)   History of stroke   Venous stasis ulcers of both lower extremities (HCC)   ESRD (end stage renal disease) (HCC)   Atrial fibrillation with rapid ventricular response ([place])   Pulmonary hypertension, unspecified ([place])   Palliative care by specialist   Goals of care, counseling/discussion   General weakness   Cellulitis of both lower extremities   Ulcer of left lower extremity with fat layer exposed ([place])   Urinary tract infection without hematuria   1 sepsis secondary to left lower extremity purulent  cellulitis/infected nonhealing wounds due to PAD and possible Proteus UTI, POA Patient pancultured.  Blood cultures with no growth to date.  Urine cultures with 10,000 colonies of Proteus Mirabilis.  2D echo which was done did not show a large vegetation.  Patient noted to have had an abdominal aortogram on 05/30/2020 that showed heavily calcified bilateral proximal arteries with flush occlusion of bilateral superficial femoral artery.  CT left lower extremity with open ulceration with cellulitis and myofascitis. -Per vascular surgery amputation will be next consideration if pain was unbearable.  Palliative care following and helping manage patient's current pain as well as goals of care. -Was on vancomycin and meropenem and has been transitioned to Zyvox.  IV Zyvox transition to oral Zyvox day 9/14.  -Norvasc discontinued secondary to edema. -We will need outpatient follow-up with vascular surgery and wound care. -Follow.  2.  Acute on chronic hypoxemic respiratory failure secondary to acute on chronic diastolic CHF exacerbation 2D echo done with EF of 55 to 60%, right wall motion abnormalities, RVSP 73.4, severe LAE, moderate RAE, moderate to severe MVR, severe mitral annular calcification and severe TR.  Patient was on IV Lasix and has been transitioned to oral torsemide which she seems to be tolerating.  Clinical improvement.  Urine output of 1.7 L over the past 24 hours.  Continue oral Lopressor.  Cardiology was following but have signed off.    3.  Moderate to severe mitral valve regurgitation/severe tricuspid valvular regurgitation Per cardiology not a candidate for surgical intervention at this time.  Continue diuretics and cardiac medications.  Outpatient  follow-up with cardiology.  4.  History of severe AI status post AVR with prosthetic valve Stable.  Per cardiology.  5.  A. fib with RVR Currently rate controlled on IV Lopressor.  Patient has been refusing oral medications early on  during the hospitalization however now in agreement with taking oral medications.  Patient transition from IV Lopressor to oral Lopressor as recommended per cardiology.  Currently on Lovenox bridge with Coumadin (06/12/2020 ).  Follow.   6.  History of coronary artery disease status post stents Continue beta-blocker, anticoagulation.  Per cardiology.  7.  History of embolic CVA with residual expressive aphasia CT head done during this hospitalization showed prior infarct at the junction of the left temporoparietal and occipital lobes.  No acute infarct noted.  Patient being followed by speech therapy.  Continue risk factor modification, beta-blocker, on Lovenox bridge with Coumadin.  INR subtherapeutic.  Resume statin on discharge.  Supportive care.    8.  Abdominal pain CT abdomen and pelvis without contrast revealed new body wall edema.  Recent arteriogram does not look like ischemic bowel.  Improving.  9.  History of end-stage renal disease status post renal transplant now with chronic kidney disease stage IV Stable.  Continue low-dose prednisone and tacrolimus.  Diet liberated due to poor oral intake.  Outpatient follow-up with nephrology.  10.  Uncontrolled type 2 diabetes mellitus Hemoglobin A1c 9.2 (06/03/2020 ).  CBG 156 this morning.  Continue Lantus 14 units daily and uptitrate as needed for better blood glucose control.  Sliding scale insulin.   11.  Hypertension Stable on current regimen of torsemide and metoprolol.    12.  Left upper extremity swelling Left upper extremity Dopplers negative for DVT.  Outpatient follow-up.  56.  Hyperlipidemia Likely resume statin on discharge.   14.  Goals of care Patient with multiple comorbidities.  Patient with a poor prognosis.  Patient full code.  Palliative care following and discussing with family.  15.  Inadequate oral intake Patient states improvement with oral intake.  Continue nutritional supplementation.  DVT prophylaxis:  Lovenox bridge/Coumadin  Code Status: Full Family Communication: Updated patient and husband at bedside. Disposition:   Status is: Inpatient    Dispo: The patient is from: Home              Anticipated d/c is to: Home with home health with palliative care following.               Anticipated d/c date is: 1 to 2 days.               Patient currently being diuresed due to CHF exacerbation, has arterial ulcers, on antibiotics, INR not therapeutic.  Not medically stable for discharge.        Consultants:   Wound care nurse Domenic Moras, FNP 06/03/2020  Cardiology: Dr. Oval Linsey 06/03/2020  Palliative care: Dr. Domingo Cocking 06/04/2020    Procedures:   CT left tib-fib 06/05/2020  CT abdomen and pelvis 06/03/2020  CT head 06/03/2020  Chest x-ray 06/03/2020  2D echo 06/03/2020  Left upper extremity Dopplers 06/07/2020  Antimicrobials:   Oral Zyvox 06/06/2020>>> 06/10/2020:: Oral Zyvox 06/13/2020>>>> 06/19/2020  Oral Flagyl 06/04/2020>>>> 06/05/2020  IV Ancef 06/05/2020 x1 dose  IV cefepime 06/03/2020 x 1 dose  IV Rocephin 06/04/2020>>>> 06/05/2020  IV Zyvox 06/04/2020 >>> 06/05/2020, 06/10/2020>>> 06/13/2020  IV Merrem 06/03/2020>>> 06/04/2020  IV Zosyn 06/03/2020 x 1 dose  IV vancomycin 06/03/2020 x 1 dose   Subjective: Patient laying in bed.  States  she is feeling much better.  Denies any chest pain or shortness of breath.  States has been compliant with oral medications.  States appetite improving.  Wants to go home.   Objective: Vitals:   06/13/20 1143 06/13/20 1315 06/13/20 2228 06/14/20 0636  BP: 119/63 (!) 159/77 (!) 145/75 (!) 156/75  Pulse: 92 85 92 85  Resp:  19 18 18   Temp:  98.4 F (36.9 C) 98.5 F (36.9 C) 98 F (36.7 C)  TempSrc:   Oral Oral  SpO2:  97% 96% 98%  Weight:      Height:        Intake/Output Summary (Last 24 hours) at 06/14/2020 1038 Last data filed at 06/14/2020 0924 Gross per 24 hour  Intake 895 ml  Output 700 ml    Net 195 ml   Filed Weights   06/11/20 0500 06/12/20 0500 06/13/20 0403  Weight: 81.5 kg 81.5 kg 78.8 kg    Examination:  General exam: NAD Respiratory system: CTAB.  No wheezes, no crackles, no rhonchi.  Normal respiratory effort.  Speaking in full sentences. Cardiovascular system: Irregularly irregular.  No murmurs rubs or gallops.  No JVD.  No lower extremity edema.   Gastrointestinal system: Abdomen is soft, nontender, nondistended, positive bowel sounds.  No rebound.  No guarding. Central nervous system: Alert and oriented. No focal neurological deficits. Extremities: Left lower extremity bandaged.  Left upper extremity swelling.  Symmetric 5 x 5 power. Skin: No rashes, lesions or ulcers Psychiatry: Judgement and insight appear normal. Mood & affect appropriate.     Data Reviewed: I have personally reviewed following labs and imaging studies  CBC: Recent Labs  Lab 06/09/20 0418 06/09/20 0418 06/10/20 0414 06/11/20 0453 06/12/20 0358 06/13/20 0439 06/14/20 0412  WBC 12.1*   < > 9.7 11.2* 11.7* 11.0* 10.1  NEUTROABS 10.2*  --  8.1*  --   --   --   --   HGB 11.1*   < > 11.3* 11.0* 10.5* 10.9* 10.6*  HCT 36.7   < > 37.7 36.5 33.7* 35.0* 34.8*  MCV 77.9*   < > 78.2* 79.0* 77.5* 77.3* 78.2*  PLT 219   < > 220 219 212 209 194   < > = values in this interval not displayed.    Basic Metabolic Panel: Recent Labs  Lab 06/08/20 0350 06/08/20 0350 06/09/20 0418 06/09/20 0418 06/10/20 0414 06/11/20 0453 06/12/20 0358 06/13/20 0439 06/14/20 0412  NA 141   < > 137   < > 139 138 137 137 139  K 3.5   < > 4.0   < > 3.8 3.7 3.6 3.6 3.6  CL 97*   < > 95*   < > 96* 97* 96* 96* 97*  CO2 29   < > 31   < > 30 29 32 31 32  GLUCOSE 108*   < > 276*   < > 168* 144* 247* 216* 183*  BUN 39*   < > 43*   < > 46* 42* 50* 51* 50*  CREATININE 2.48*   < > 2.67*   < > 2.66* 2.24* 2.52* 2.42* 2.53*  CALCIUM 9.5   < > 9.6   < > 9.9 9.8 9.7 10.1 10.1  MG 2.1  --  2.0  --  1.9 2.0  --   --    --   PHOS  --   --  3.7  --  2.8 2.9 3.2 2.9  --    < > =  values in this interval not displayed.    GFR: Estimated Creatinine Clearance: 18 mL/min (A) (by C-G formula based on SCr of 2.53 mg/dL (H)).  Liver Function Tests: Recent Labs  Lab 06/08/20 0350 06/08/20 0350 06/09/20 0418 06/10/20 0414 06/11/20 0453 06/12/20 0358 06/13/20 0439  AST 54*  --   --   --   --   --   --   ALT 18  --   --   --   --   --   --   ALKPHOS 672*  --   --   --   --   --   --   BILITOT 1.0  --   --   --   --   --   --   PROT 6.4*  --   --   --   --   --   --   ALBUMIN 2.4*   < > 2.3* 2.4* 2.3* 2.3* 2.5*   < > = values in this interval not displayed.    CBG: Recent Labs  Lab 06/13/20 0716 06/13/20 1113 06/13/20 1616 06/13/20 2220 06/14/20 0714  GLUCAP 231* 239* 255* 126* 156*     No results found for this or any previous visit (from the past 240 hour(s)).       Radiology Studies: No results found.      Scheduled Meds: . acetaminophen  650 mg Oral Q6H  . collagenase   Topical Daily  . enoxaparin (LOVENOX) injection  1 mg/kg Subcutaneous Q24H  . feeding supplement  237 mL Oral TID BM  . fentaNYL  1 patch Transdermal Q72H  . insulin aspart  0-5 Units Subcutaneous QHS  . insulin aspart  0-6 Units Subcutaneous TID WC  . insulin aspart  2 Units Subcutaneous TID WC  . insulin glargine  14 Units Subcutaneous Daily  . levothyroxine  50 mcg Oral Q0600  . linezolid  600 mg Oral Q12H  . metoprolol tartrate  25 mg Oral BID  . predniSONE  5 mg Oral QAC breakfast  . sodium chloride flush  3 mL Intravenous Q12H  . tacrolimus  3 mg Oral BID  . torsemide  100 mg Oral Daily  . warfarin  5 mg Oral ONCE-1600  . Warfarin - Pharmacist Dosing Inpatient   Does not apply q1600   Continuous Infusions:    LOS: 11 days    Time spent: 40 minutes    Irine Seal, MD [facility]   To contact the attending provider between 7A-7P or the covering provider during after hours  7P-7A, please log into the web site [email or web address] and access using universal [place] password for that web site. If you do not have the password, please call the hospital operator.  06/14/2020, 10:38 AM

## 2020-06-14 NOTE — Progress Notes (Signed)
    Durable Medical Equipment  (From admission, onward)         Start     Ordered   06/14/20 1428  For home use only DME Hospital bed  Once       Question Answer Comment  Length of Need Lifetime   Patient has (list medical condition): CAD s/p stents, severe AS s/p AVR, OSA/COPD/chronic RF with as needed O2, diastolic CHF, A. Fib s/p maze on warfarin, DM-2, ESRD s/p transplant now with CKD-4, adenocarcinoma of lung, embolic CVA with residual aphasia and BLE nonhealing wounds   The above medical condition requires: Patient requires the ability to reposition frequently   Bed type Semi-electric   Hoyer Lift Yes   Support Surface: Gel Overlay      06/14/20 1429

## 2020-06-14 NOTE — TOC Progression Note (Signed)
Transition of Care [facility]) - Progression Note    Patient Details  Name: [name] MRN: [id number] Date of Birth: 28-Oct-1945  [facility] CM/SW Contact  Joaquin Courts, RN Phone Number: 06/14/2020, 3:58 PM  Clinical Narrative:    CM spoke with patient spouse, who reports he anticipates that patient will come home on Monday. Reports they live in a two-story hom, but he plans to keep patient on the lower level until she is strong enough to make it up and down the stairs again.  [employer] given referral for dme hospital bed and hoyer lift.  Spouse reports patient has wheelchair and 3in1.     Expected Discharge Plan: [place] Barriers to Discharge: Barriers Resolved  Expected Discharge Plan and Services Expected Discharge Plan: [place]   Discharge Planning Services: CM Consult Post Acute Care Choice: [place] Living arrangements for the past 2 months: Single Family Home Expected Discharge Date:  (unknown)               DME Arranged: Hospital bed (hoyer lift) DME Agency: [employer] Date DME Agency Contacted: 06/14/20 Time DME Agency Contacted: [phone] Representative spoke with at DME Agency: [place]: RN, PT, OT, Nurse's Aide, Social Work [employer] Agency: [place] Date [place]: 06/13/20 Time [place]: 1537 Representative spoke with at [place]: [place] ([place]) Interventions    Readmission Risk Interventions Readmission Risk Prevention Plan 10/03/2019  Transportation Screening Complete  Medication Review Press photographer) Complete  HRI or [place] Complete  SW Recovery Care/Counseling Consult Complete  [place] Not Applicable  Some recent data might be hidden

## 2020-06-15 DIAGNOSIS — I4891 Unspecified atrial fibrillation: Secondary | ICD-10-CM | POA: Diagnosis not present

## 2020-06-15 DIAGNOSIS — A419 Sepsis, unspecified organism: Secondary | ICD-10-CM | POA: Diagnosis not present

## 2020-06-15 DIAGNOSIS — N390 Urinary tract infection, site not specified: Secondary | ICD-10-CM | POA: Diagnosis not present

## 2020-06-15 DIAGNOSIS — I5033 Acute on chronic diastolic (congestive) heart failure: Secondary | ICD-10-CM | POA: Diagnosis not present

## 2020-06-15 LAB — CBC
HCT: 33.6 % — ABNORMAL LOW (ref 36.0–46.0)
Hemoglobin: 10.3 g/dL — ABNORMAL LOW (ref 12.0–15.0)
MCH: 23.4 pg — ABNORMAL LOW (ref 26.0–34.0)
MCHC: 30.7 g/dL (ref 30.0–36.0)
MCV: 76.4 fL — ABNORMAL LOW (ref 80.0–100.0)
Platelets: 207 10*3/uL (ref 150–400)
RBC: 4.4 MIL/uL (ref 3.87–5.11)
RDW: 19.7 % — ABNORMAL HIGH (ref 11.5–15.5)
WBC: 8.5 10*3/uL (ref 4.0–10.5)
nRBC: 0 % (ref 0.0–0.2)

## 2020-06-15 LAB — PROTIME-INR
INR: 1.3 — ABNORMAL HIGH (ref 0.8–1.2)
Prothrombin Time: 15.8 seconds — ABNORMAL HIGH (ref 11.4–15.2)

## 2020-06-15 LAB — BASIC METABOLIC PANEL
Anion gap: 12 (ref 5–15)
BUN: 50 mg/dL — ABNORMAL HIGH (ref 8–23)
CO2: 31 mmol/L (ref 22–32)
Calcium: 10.3 mg/dL (ref 8.9–10.3)
Chloride: 97 mmol/L — ABNORMAL LOW (ref 98–111)
Creatinine, Ser: 2.48 mg/dL — ABNORMAL HIGH (ref 0.44–1.00)
GFR, Estimated: 20 mL/min — ABNORMAL LOW (ref >60)
Glucose, Bld: 151 mg/dL — ABNORMAL HIGH (ref 70–99)
Potassium: 3.7 mmol/L (ref 3.5–5.1)
Sodium: 140 mmol/L (ref 135–145)

## 2020-06-15 LAB — GLUCOSE, CAPILLARY
Glucose-Capillary: 151 mg/dL — ABNORMAL HIGH (ref 70–99)
Glucose-Capillary: 120 mg/dL — ABNORMAL HIGH (ref 70–99)
Glucose-Capillary: 155 mg/dL — ABNORMAL HIGH (ref 70–99)
Glucose-Capillary: 115 mg/dL — ABNORMAL HIGH (ref 70–99)

## 2020-06-15 MED ORDER — WARFARIN SODIUM 5 MG PO TABS
5.0000 mg | ORAL_TABLET | Freq: Once | ORAL | Status: AC
  Administered 2020-06-15: 5 mg via ORAL
  Filled 2020-06-15: qty 1

## 2020-06-15 NOTE — Plan of Care (Signed)

## 2020-06-15 NOTE — Progress Notes (Signed)
[place] for warfarin Indication: hx bioprosthetic AVR, stroke, afib  Allergies  Allergen Reactions  . Ibuprofen Nausea And Vomiting  . Sulfamethoxazole-Trimethoprim Itching, Swelling and Rash    Swelling of the face  . Sulfonamide Derivatives Itching, Swelling and Rash    Swelling of the face  . Tape Rash    Paper tape is ok  . Tramadol Nausea And Vomiting  . Doxycycline Nausea Only  . Hydrocil [Psyllium] Nausea And Vomiting  . Bactrim Itching, Swelling and Rash  . Red Dye Itching and Rash   Patient Measurements: Height: 5\' 1"  (154.9 cm) Weight: 81.3 kg (179 lb 3.7 oz) IBW/kg (Calculated) : 47.8 Heparin Dosing Weight: 66.4 kg  Vital Signs: Temp: 98.6 F (37 C) (11/28 0421) Temp Source: Oral (11/28 0421) BP: 145/67 (11/28 0421) Pulse Rate: 95 (11/28 0421)  Labs: Recent Labs    06/13/20 0439 06/13/20 0439 06/14/20 0412 06/15/20 0516  HGB 10.9*   < > 10.6* 10.3*  HCT 35.0*  --  34.8* 33.6*  PLT 209  --  194 207  LABPROT 14.2  --  14.3 15.8*  INR 1.1  --  1.2 1.3*  HEPARINUNFRC 0.74*  --   --   --   CREATININE 2.42*  --  2.53* 2.48*   < > = values in this interval not displayed.    Estimated Creatinine Clearance: 18.6 mL/min (A) (by C-G formula based on SCr of 2.48 mg/dL (H)).   Medications:  - on warfarin PTA  Assessment: Patient is a 74 y.o F with hx bioprosthetic AVR, stroke, and afib on warfarin PTA presented to the ED on 11/16 with fever. Pharmacy is consulted to transition to heparin drip on admission.  Pt home warfarin regimen is warfarin 2.5 mg PO daily except 5mg  on thursdays   Today, 06/15/2020:  Heparin  has been changed to enoxaparin, currently 1 mg/kg q24h for CrcCl < 30 ml/min   CBC stable  SCr 2.48, CrCl ~ 18 ml/min    No bleeding issues noted per nursing  Patient eating 25% of meal tray + ordered Ensure x3 daily (Drank 2 yesterday)   No major drug-drug interactions noted  Goal of Therapy:   Heparin level 0.3-0.7 units/ml Monitor platelets by anticoagulation protocol: Yes  Target INR 2-3 per Warfarin clinic notes 05/23/2020   Plan:   Enoxaparin 80 mg SQ q24h   Warfarin 5 mg PO x1 today   Daily heparin level, CBC  Monitor closely for s/sx of bleeding   Royetta Asal, PharmD, BCPS 06/15/2020 9:04 AM

## 2020-06-15 NOTE — Progress Notes (Signed)
PROGRESS NOTE    [name]  YOV:[id number] DOB: [birth year] DOA: 06/03/2020 PCP: [name], MD    Chief Complaint  Patient presents with  . Fever    Brief Narrative:  74 year old female with history of CAD s/p stents, severe AS s/p AVR, OSA/COPD/chronic RF with as needed O2, diastolic CHF, A. Fib s/p maze on warfarin, DM-2, ESRD s/p transplant now with CKD-4, adenocarcinoma of lung, embolic CVA with residual aphasia and BLE nonhealing wounds s/p abdominal aortogram on 11/12 found to have fever, tachypnea, tachycardia with difficulty clearing secretions after she called EMS for epistaxis.  Patient was admitted for sepsis due to LLE purulent cellulitis and Proteus UTI, and A. fib with RVR, acute on chronic diastolic CHF and acute on chronic hypoxemic RF.  Empirically treated with vancomycin and meropenem which were narrowed to Zyvox.  Also treated with IV Lasix for CHF exacerbation.  Cardiology and palliative medicine following.  PMT planning to meet with family today to discuss goals of care.   Assessment & Plan:   Principal Problem:   Sepsis ([place]) Active Problems:   Type 2 diabetes mellitus with chronic kidney disease, with long-term current use of insulin (HCC)   Hyperlipidemia LDL goal <70   Renal transplant recipient   S/P aortic valve replacement with bioprosthetic valve and maze procedure   Acute on chronic diastolic CHF (congestive heart failure) (HCC)   History of stroke   Venous stasis ulcers of both lower extremities (HCC)   ESRD (end stage renal disease) (HCC)   Atrial fibrillation with rapid ventricular response ([place])   Pulmonary hypertension, unspecified ([place])   Palliative care by specialist   Goals of care, counseling/discussion   General weakness   Cellulitis of both lower extremities   Ulcer of left lower extremity with fat layer exposed ([place])   Urinary tract infection without hematuria   1 sepsis secondary to left lower extremity purulent  cellulitis/infected nonhealing wounds due to PAD and possible Proteus UTI, POA Patient pancultured.  Blood cultures with no growth to date.  Urine cultures with 10,000 colonies of Proteus Mirabilis.  2D echo which was done did not show a large vegetation.  Patient noted to have had an abdominal aortogram on 05/30/2020 that showed heavily calcified bilateral proximal arteries with flush occlusion of bilateral superficial femoral artery.  CT left lower extremity with open ulceration with cellulitis and myofascitis. -Per vascular surgery amputation will be next consideration if pain was unbearable.  Palliative care following and helping manage patient's current pain as well as goals of care. -Was on vancomycin and meropenem and has been transitioned to Zyvox.  IV Zyvox transition to oral Zyvox day 10/14.  -Norvasc discontinued secondary to edema. -We will need outpatient follow-up with vascular surgery and wound care. -Follow.  2.  Acute on chronic hypoxemic respiratory failure secondary to acute on chronic diastolic CHF exacerbation 2D echo done with EF of 55 to 60%, right wall motion abnormalities, RVSP 73.4, severe LAE, moderate RAE, moderate to severe MVR, severe mitral annular calcification and severe TR.  Patient was on IV Lasix and has been transitioned to oral torsemide which she seems to be tolerating.  Clinically.  Urine output not properly recorded over the past 24 hours.  Cardiology was following but have signed off.  Outpatient follow-up with cardiology.  3.  Moderate to severe mitral valve regurgitation/severe tricuspid valvular regurgitation Per cardiology not a candidate for surgical intervention at this time.  Continue diuretics and cardiac medications.  Outpatient follow-up with  cardiology.  4.  History of severe AI status post AVR with prosthetic valve Stable.  Per cardiology.  5.  A. fib with RVR Currently rate controlled on Lopressor.  Patient has been refusing oral medications  early on during the hospitalization however now in agreement with taking oral medications.  Patient initially on IV Lopressor has been transitioned to oral Lopressor as recommended per cardiology.  Lovenox bridge with Coumadin (06/12/2020).  Will need outpatient follow-up at the Coumadin clinic.  Follow.    6.  History of coronary artery disease status post stents Continue beta-blocker, anticoagulation.  Per cardiology.  7.  History of embolic CVA with residual expressive aphasia CT head done during this hospitalization showed prior infarct at the junction of the left temporoparietal and occipital lobes.  No acute infarct noted.  Patient being followed by speech therapy.  Continue risk factor modification, beta-blocker, on Lovenox bridge with Coumadin.  INR subtherapeutic at 1.3.  Likely resume statin on discharge.  Supportive care.  8.  Abdominal pain CT abdomen and pelvis without contrast revealed new body wall edema.  Recent arteriogram does not look like ischemic bowel.  Improving.  9.  History of end-stage renal disease status post renal transplant now with chronic kidney disease stage IV Stable.  Continue low-dose prednisone and tacrolimus.  Diet liberated due to poor oral intake.  Outpatient follow-up with nephrology.  10.  Uncontrolled type 2 diabetes mellitus Hemoglobin A1c 9.2 (06/03/2020 ).  CBG 151 this morning.  Continue Lantus 14 units daily and uptitrate as needed for better blood glucose control.  Sliding scale insulin.   11.  Hypertension Controlled on current regimen of torsemide and metoprolol.   12.  Left upper extremity swelling Left upper extremity Dopplers negative for DVT.  Outpatient follow-up.  13.  Hyperlipidemia Resume statin on discharge.    14.  Goals of care Patient with multiple comorbidities.  Patient with a poor prognosis.  Patient full code.  Palliative care following and discussing with family.  Patient likely will need palliative care to follow on  discharge.  15.  Inadequate oral intake Patient states improvement with oral intake.  Continue nutritional supplementation.  DVT prophylaxis: Lovenox bridge/Coumadin  Code Status: Full Family Communication: Updated patient.  No family at bedside.  Disposition:   Status is: Inpatient    Dispo: The patient is from: Home              Anticipated d/c is to: Home with home health with palliative care following.               Anticipated d/c date is: 06/16/2020               Patient with arterial ulcers, on antibiotics, being transitioned to oral medications.  INR subtherapeutic.  Not stable for discharge.         Consultants:   Wound care nurse Domenic Moras, FNP 06/03/2020  Cardiology: Dr. Oval Linsey 06/03/2020  Palliative care: Dr. Domingo Cocking 06/04/2020    Procedures:   CT left tib-fib 06/05/2020  CT abdomen and pelvis 06/03/2020  CT head 06/03/2020  Chest x-ray 06/03/2020  2D echo 06/03/2020  Left upper extremity Dopplers 06/07/2020  Antimicrobials:   Oral Zyvox 06/06/2020>>> 06/10/2020:: Oral Zyvox 06/13/2020>>>> 06/19/2020  Oral Flagyl 06/04/2020>>>> 06/05/2020  IV Ancef 06/05/2020 x1 dose  IV cefepime 06/03/2020 x 1 dose  IV Rocephin 06/04/2020>>>> 06/05/2020  IV Zyvox 06/04/2020 >>> 06/05/2020, 06/10/2020>>> 06/13/2020  IV Merrem 06/03/2020>>> 06/04/2020  IV Zosyn 06/03/2020 x 1 dose  IV vancomycin 06/03/2020 x 1 dose   Subjective: Laying in bed.  Denies any chest pain or shortness of breath.  States she is feeling better.  Tolerating current diet.    Objective: Vitals:   06/14/20 1403 06/14/20 2113 06/15/20 0421 06/15/20 0500  BP: (!) 142/69 (!) 154/68 (!) 145/67   Pulse: 86 89 95   Resp: 16 (!) 21 17   Temp: 98 F (36.7 C) 98.8 F (37.1 C) 98.6 F (37 C)   TempSrc:  Oral Oral   SpO2: 99% 98% (!) 80% 98%  Weight:    81.3 kg  Height:        Intake/Output Summary (Last 24 hours) at 06/15/2020 1049 Last data filed at 06/15/2020  6010 Gross per 24 hour  Intake 360 ml  Output 500 ml  Net -140 ml   Filed Weights   06/12/20 0500 06/13/20 0403 06/15/20 0500  Weight: 81.5 kg 78.8 kg 81.3 kg    Examination:  General exam: NAD Respiratory system: Lungs clear to auscultation bilaterally anterior lung fields.  No wheezes, no crackles, no rhonchi.  Normal respiratory effort.  Speaking in full sentences. Cardiovascular system: Irregularly irregular.  No murmurs rubs or gallops.  No JVD.  No lower extremity edema.   Gastrointestinal system: Abdomen is soft, nontender, nondistended, positive bowel sounds.  No rebound.  No guarding.   Central nervous system: Alert and oriented. No focal neurological deficits. Extremities: Left lower extremity bandaged.  Left upper extremity swelling improved.  Symmetric 5 x 5 power. Skin: No rashes, lesions or ulcers Psychiatry: Judgement and insight appear normal. Mood & affect appropriate.     Data Reviewed: I have personally reviewed following labs and imaging studies  CBC: Recent Labs  Lab 06/09/20 0418 06/09/20 0418 06/10/20 0414 06/10/20 0414 06/11/20 0453 06/12/20 0358 06/13/20 0439 06/14/20 0412 06/15/20 0516  WBC 12.1*   < > 9.7   < > 11.2* 11.7* 11.0* 10.1 8.5  NEUTROABS 10.2*  --  8.1*  --   --   --   --   --   --   HGB 11.1*   < > 11.3*   < > 11.0* 10.5* 10.9* 10.6* 10.3*  HCT 36.7   < > 37.7   < > 36.5 33.7* 35.0* 34.8* 33.6*  MCV 77.9*   < > 78.2*   < > 79.0* 77.5* 77.3* 78.2* 76.4*  PLT 219   < > 220   < > 219 212 209 194 207   < > = values in this interval not displayed.    Basic Metabolic Panel: Recent Labs  Lab 06/09/20 0418 06/09/20 0418 06/10/20 0414 06/10/20 0414 06/11/20 0453 06/12/20 0358 06/13/20 0439 06/14/20 0412 06/15/20 0516  NA 137   < > 139   < > 138 137 137 139 140  K 4.0   < > 3.8   < > 3.7 3.6 3.6 3.6 3.7  CL 95*   < > 96*   < > 97* 96* 96* 97* 97*  CO2 31   < > 30   < > 29 32 31 32 31  GLUCOSE 276*   < > 168*   < > 144* 247*  216* 183* 151*  BUN 43*   < > 46*   < > 42* 50* 51* 50* 50*  CREATININE 2.67*   < > 2.66*   < > 2.24* 2.52* 2.42* 2.53* 2.48*  CALCIUM 9.6   < > 9.9   < > 9.8 9.7  10.1 10.1 10.3  MG 2.0  --  1.9  --  2.0  --   --   --   --   PHOS 3.7  --  2.8  --  2.9 3.2 2.9  --   --    < > = values in this interval not displayed.    GFR: Estimated Creatinine Clearance: 18.6 mL/min (A) (by C-G formula based on SCr of 2.48 mg/dL (H)).  Liver Function Tests: Recent Labs  Lab 06/09/20 0418 06/10/20 0414 06/11/20 0453 06/12/20 0358 06/13/20 0439  ALBUMIN 2.3* 2.4* 2.3* 2.3* 2.5*    CBG: Recent Labs  Lab 06/14/20 0714 06/14/20 1152 06/14/20 1649 06/14/20 2111 06/15/20 0728  GLUCAP 156* 231* 179* 124* 151*     No results found for this or any previous visit (from the past 240 hour(s)).       Radiology Studies: No results found.      Scheduled Meds: . acetaminophen  650 mg Oral Q6H  . collagenase   Topical Daily  . enoxaparin (LOVENOX) injection  1 mg/kg Subcutaneous Q24H  . feeding supplement  237 mL Oral TID BM  . fentaNYL  1 patch Transdermal Q72H  . insulin aspart  0-5 Units Subcutaneous QHS  . insulin aspart  0-6 Units Subcutaneous TID WC  . insulin aspart  2 Units Subcutaneous TID WC  . insulin glargine  14 Units Subcutaneous Daily  . levothyroxine  50 mcg Oral Q0600  . linezolid  600 mg Oral Q12H  . metoprolol tartrate  25 mg Oral BID  . predniSONE  5 mg Oral QAC breakfast  . sodium chloride flush  3 mL Intravenous Q12H  . tacrolimus  3 mg Oral BID  . torsemide  100 mg Oral Daily  . warfarin  5 mg Oral ONCE-1600  . Warfarin - Pharmacist Dosing Inpatient   Does not apply q1600   Continuous Infusions:    LOS: 12 days    Time spent: 35 minutes    Irine Seal, MD [facility]   To contact the attending provider between 7A-7P or the covering provider during after hours 7P-7A, please log into the web site [email or web address] and access using universal  [place] password for that web site. If you do not have the password, please call the hospital operator.  06/15/2020, 10:49 AM

## 2020-06-16 DIAGNOSIS — A419 Sepsis, unspecified organism: Secondary | ICD-10-CM | POA: Diagnosis not present

## 2020-06-16 DIAGNOSIS — Z7189 Other specified counseling: Secondary | ICD-10-CM | POA: Diagnosis not present

## 2020-06-16 DIAGNOSIS — I4891 Unspecified atrial fibrillation: Secondary | ICD-10-CM | POA: Diagnosis not present

## 2020-06-16 DIAGNOSIS — I5033 Acute on chronic diastolic (congestive) heart failure: Secondary | ICD-10-CM | POA: Diagnosis not present

## 2020-06-16 DIAGNOSIS — R531 Weakness: Secondary | ICD-10-CM | POA: Diagnosis not present

## 2020-06-16 DIAGNOSIS — N186 End stage renal disease: Secondary | ICD-10-CM | POA: Diagnosis not present

## 2020-06-16 DIAGNOSIS — N390 Urinary tract infection, site not specified: Secondary | ICD-10-CM | POA: Diagnosis not present

## 2020-06-16 LAB — BASIC METABOLIC PANEL
Anion gap: 11 (ref 5–15)
BUN: 53 mg/dL — ABNORMAL HIGH (ref 8–23)
CO2: 33 mmol/L — ABNORMAL HIGH (ref 22–32)
Calcium: 10.1 mg/dL (ref 8.9–10.3)
Chloride: 98 mmol/L (ref 98–111)
Creatinine, Ser: 2.63 mg/dL — ABNORMAL HIGH (ref 0.44–1.00)
GFR, Estimated: 18 mL/min — ABNORMAL LOW (ref >60)
Glucose, Bld: 113 mg/dL — ABNORMAL HIGH (ref 70–99)
Potassium: 4 mmol/L (ref 3.5–5.1)
Sodium: 142 mmol/L (ref 135–145)

## 2020-06-16 LAB — PROTIME-INR
INR: 1.7 — ABNORMAL HIGH (ref 0.8–1.2)
Prothrombin Time: 19.2 seconds — ABNORMAL HIGH (ref 11.4–15.2)

## 2020-06-16 LAB — GLUCOSE, CAPILLARY
Glucose-Capillary: 160 mg/dL — ABNORMAL HIGH (ref 70–99)
Glucose-Capillary: 187 mg/dL — ABNORMAL HIGH (ref 70–99)
Glucose-Capillary: 205 mg/dL — ABNORMAL HIGH (ref 70–99)
Glucose-Capillary: 185 mg/dL — ABNORMAL HIGH (ref 70–99)

## 2020-06-16 LAB — HEMOGLOBIN AND HEMATOCRIT, BLOOD
HCT: 35 % — ABNORMAL LOW (ref 36.0–46.0)
Hemoglobin: 10.3 g/dL — ABNORMAL LOW (ref 12.0–15.0)

## 2020-06-16 MED ORDER — TOUJEO SOLOSTAR 300 UNIT/ML ~~LOC~~ SOPN: 15.0000 [IU] | PEN_INJECTOR | Freq: Every morning | SUBCUTANEOUS | Status: DC

## 2020-06-16 MED ORDER — METOPROLOL TARTRATE 25 MG PO TABS: 25.0000 mg | ORAL_TABLET | Freq: Two times a day (BID) | ORAL | 1 refills | Status: AC

## 2020-06-16 MED ORDER — ACETAMINOPHEN 325 MG PO TABS: 500.0000 mg | ORAL_TABLET | Freq: Four times a day (QID) | ORAL | Status: AC

## 2020-06-16 MED ORDER — LINEZOLID 600 MG PO TABS: 600.0000 mg | ORAL_TABLET | Freq: Two times a day (BID) | ORAL | 0 refills | Status: AC

## 2020-06-16 MED ORDER — WARFARIN SODIUM 5 MG PO TABS
2.5000 mg | ORAL_TABLET | ORAL | Status: DC
Start: 2020-06-17 — End: 2020-07-09

## 2020-06-16 MED ORDER — FENTANYL 25 MCG/HR TD PT72: 1.0000 | MEDICATED_PATCH | TRANSDERMAL | 0 refills | Status: DC

## 2020-06-16 MED ORDER — WARFARIN SODIUM 5 MG PO TABS
5.0000 mg | ORAL_TABLET | Freq: Once | ORAL | Status: AC
  Administered 2020-06-16: 5 mg via ORAL
  Filled 2020-06-16: qty 1

## 2020-06-16 MED ORDER — ENOXAPARIN SODIUM 80 MG/0.8ML ~~LOC~~ SOLN: 1.0000 mg/kg | SUBCUTANEOUS | 0 refills | Status: AC

## 2020-06-16 MED ORDER — OXYCODONE HCL 5 MG PO TABS: 5.0000 mg | ORAL_TABLET | ORAL | 0 refills | Status: DC | PRN

## 2020-06-16 MED ORDER — HUMALOG KWIKPEN 200 UNIT/ML ~~LOC~~ SOPN: 3.0000 [IU] | PEN_INJECTOR | Freq: Three times a day (TID) | SUBCUTANEOUS | 1 refills | Status: DC

## 2020-06-16 NOTE — Discharge Summary (Signed)
Physician Discharge Summary  [name] YSA:[id number] DOB: 10-11-1945 DOA: 06/03/2020  PCP: [name] Anger, MD  Admit date: 06/03/2020 Discharge date: 06/16/2020  Time spent: 60 minutes  Recommendations for Outpatient Follow-up:  1. Follow-up with [name], [name], MD in 1 to 2 weeks.  On follow-up patient will need the diabetes reassessed as patient's Toujeo dose was decreased to 15 units daily.  Patient also need a basic metabolic profile done to follow-up on electrolytes and renal function. 2. Follow-up with Dr. Stanford [name], cardiology in 2 weeks for follow-up on cardiac issues. 3. Follow-up at Coumadin clinic on Wednesday June 18, 2020 for INR check.  Once INR is deemed therapeutic patient will need to be advised when Lovenox bridge can be discontinued. 4. Follow-up with Dr. Oneida [name], vascular surgery in 1 to 2 weeks for follow-up on lower extremity arterial ulcers/wounds.   Discharge Diagnoses:  Principal Problem:   Sepsis ([name]) Active Problems:   Type 2 diabetes mellitus with chronic kidney disease, with long-term current use of insulin (HCC)   Hyperlipidemia LDL goal <70   Renal transplant recipient   S/P aortic valve replacement with bioprosthetic valve and maze procedure   Acute on chronic diastolic CHF (congestive heart failure) (HCC)   History of stroke   Venous stasis ulcers of both lower extremities (HCC)   ESRD (end stage renal disease) (HCC)   Atrial fibrillation with rapid ventricular response ([name])   Pulmonary hypertension, unspecified ([name])   Palliative care by specialist   Goals of care, counseling/discussion   General weakness   Cellulitis of both lower extremities   Ulcer of left lower extremity with fat layer exposed ([name])   Urinary tract infection without hematuria   Discharge Condition: Stable and improved  Diet recommendation: Regular  Filed Weights   06/13/20 0403 06/15/20 0500 06/16/20 0500  Weight: 78.8 kg 81.3 kg 82.6 kg    History  of present illness:  HPI per Dr. 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 Apparently per EMS, the patient originally had called out for a nosebleed but upon arrival the patient was found to be febrile, tachypneic and tachycardic with difficulty clearing secretions.   Apparently, after her procedure 4 days ago, she was not felt to be a candidate for percutaneous intervention because of flush SFA occlusion on both sides and only would be a candidate for local wound unless they did not heal then she may need amputations.   Currently she is awake and initially seemed confused due to her difficulty speaking.  Husband was unable to be contacted by phone to question baseline.  Stat head CT was ordered.  After further discussion with the patient she admitted she has chronic difficulties with speech  ED Course: Febrile, tachycardic, tachypneic,  hypoxic (SpO2 84%) placed on 2 LPM. Notable Labs: Glucose 239, BUN 61, creatinine 2.92, calcium 10.2, alk phos 136, albumin 2.8, lactic acid 1.7, WBC 14, Hb 12.5, INR 1.3, BNP 1883 (chronically elevated at about baseline), troponin 161.  VBG: pH 7.39, PCO2  54, PO2 26.8, bicarb 32.  Noted to be in atrial fibrillation with RVR.  Notable Imaging: CXR concerning for CHF pattern.  Started on sepsis protocol and initial LR boluses were discontinued prior to giving patient.  She was given LR maintenance fluid and received Lasix  80 mg IV x1, started on Cardizem drip, Tylenol, vancomycin and Zosyn.  Hospital Course:  1 sepsis secondary to left lower extremity purulent cellulitis/infected nonhealing wounds due to PAD and possible Proteus UTI, POA Patient pancultured.  Blood cultures with no growth to date.  Urine cultures with 10,000 colonies of Proteus Mirabilis.  2D echo which was done did not show a large vegetation.  Patient noted to have had an abdominal aortogram on 05/30/2020 that showed heavily calcified bilateral proximal arteries with flush occlusion of bilateral superficial femoral artery.  CT left lower extremity with open ulceration with cellulitis and myofascitis. -Per vascular surgery amputation will be next consideration if pain was unbearable.  Palliative care following and helping manage patient's current pain as well as goals of care. -Was on vancomycin and meropenem and transitioned to Zyvox.  IV Zyvox transitioned to oral Zyvox and patient be discharged home on 3 more days of oral Zyvox to complete a 14-day course of antibiotic treatment.   -Norvasc was discontinued secondary to edema.   -Outpatient follow-up with Dr. Oneida [name], vascular surgery in 1 to 2 weeks for further evaluation and recommendations.    2.  Acute on chronic hypoxemic respiratory failure secondary to acute on chronic diastolic CHF exacerbation 2D echo done with EF of 55 to 60%, right wall motion abnormalities, RVSP 73.4, severe LAE, moderate RAE, moderate to severe MVR, severe mitral annular calcification and severe TR.  Patient was on IV Lasix with good diuresis and subsequently transition back to home dose oral torsemide which she tolerated.  Patient was followed by cardiology throughout the hospitalization.  Outpatient follow-up with primary cardiologist.    3.  Moderate to severe mitral valve regurgitation/severe tricuspid valvular regurgitation Per cardiology not a candidate for surgical intervention at this time.    Patient  maintained on diuretics and cardiac medications.  Outpatient follow-up with cardiology.    4.  History of severe AI status post AVR with prosthetic valve Stable.  Per cardiology.  5.  A. fib with RVR Rate controlled on Lopressor.  Patient initially during the hospitalization had been refusing oral medications however as patient improved clinically was compliant with oral medications and IV Lopressor was subsequently transition back to home regimen of oral Lopressor 25 mg twice daily.  Patient initially was on IV heparin and subsequently transition to Lovenox bridge with Coumadin (06/12/2020 ).  INR was 1.7 on day of discharge.  Patient be discharged home with a Lovenox bridge with Coumadin and will need to follow-up at the Coumadin clinic for INR check on Wednesday June 18, 2020.    6.  History of coronary artery disease status post stents Patient maintained on beta-blocker, anticoagulation.  Patient was followed by cardiology during the hospitalization.  Outpatient follow-up with primary cardiologist.    7.  History of embolic CVA with residual expressive aphasia CT head done during this hospitalization showed prior infarct at the junction of the left temporoparietal and occipital lobes.  No acute infarct noted.  Patient being followed by speech therapy.    Patient was maintained on home regimen of beta-blocker, on Lovenox bridge with Coumadin.  INR was subtherapeutic however was 1.7 by day of discharge.  Patient be discharged home and  on Lovenox bridge with Coumadin and will need to follow-up at the Coumadin clinic for INR check on Wednesday June 18, 2020.   8.  Abdominal pain CT abdomen and pelvis without contrast revealed new body wall edema.  Recent arteriogram does not look like ischemic bowel.  Improved clinically.   9.  History of end-stage renal disease status post renal transplant now with chronic kidney disease stage IV Stable.    Patient maintained on home regimen of  low-dose prednisone and tacrolimus.  Diet was liberalized due to poor oral intake.  Outpatient follow-up with nephrology as previously scheduled.   10.  Uncontrolled type 2 diabetes mellitus Hemoglobin A1c 9.2 (06/03/2020 ).  Patient was started on Lantus and uptitrate to 14 units daily in addition to new coverage NovoLog 2 units 3 times a day as well as sliding scale insulin with good blood glucose coverage.  Patient will be discharged home on Toujeo at a decreased dose of 15 units daily, meal coverage NovoLog 3 units with meals.  Outpatient follow-up with PCP.  11.  Hypertension Controlled on home regimen of metoprolol and torsemide.  Outpatient follow-up.    12.  Left upper extremity swelling Left upper extremity Dopplers negative for DVT.  Outpatient follow-up.  41.  Hyperlipidemia Patient statin was held during the hospitalization will be resumed on discharge.   14.  Goals of care Patient with multiple comorbidities.  Patient with a poor prognosis.  Patient full code.  Palliative care following and discussing with family.  Patient will be discharged home with palliative care following in the outpatient setting.    15.  Inadequate oral intake Patient statedf improvement with oral intake.  Patient diet was liberalized for better oral intake.  Outpatient follow-up.    Procedures:  CT left tib-fib 06/05/2020  CT abdomen and pelvis 06/03/2020  CT head 06/03/2020  Chest x-ray 06/03/2020  2D echo 06/03/2020  Left upper extremity Dopplers 06/07/2020   Consultations:  Wound care nurse Domenic Moras, FNP 06/03/2020  Cardiology: Dr. Oval Linsey 06/03/2020  Palliative care: Dr. Domingo Cocking 06/04/2020  Discharge Exam: Vitals:   06/15/20 1900 06/16/20 0000  BP: (!) 152/77 128/61  Pulse: 94 71  Resp: 18 18  Temp: 98.6 F (37 C) 98.3 F (36.8 C)  SpO2: 95% 98%    General: NAD Cardiovascular: RRR Respiratory: Irregularly irregular  Discharge  Instructions   Discharge Instructions    Diet general   Complete by: As directed    Discharge wound care:   Complete by: As directed    As above   Increase activity slowly   Complete by: As directed      Allergies as of 06/16/2020      Reactions   Ibuprofen Nausea And Vomiting   Sulfamethoxazole-trimethoprim Itching, Swelling, Rash   Swelling of the face   Sulfonamide Derivatives Itching, Swelling, Rash   Swelling of the face   Tape Rash   Paper tape is ok   Tramadol Nausea And Vomiting   Doxycycline Nausea Only   Hydrocil [psyllium] Nausea And Vomiting   Bactrim Itching, Swelling, Rash   Red Dye Itching, Rash      Medication List    STOP taking these medications   amoxicillin 500 MG capsule Commonly known as: AMOXIL   clobetasol ointment 0.05 % Commonly known as: TEMOVATE   oxyCODONE-acetaminophen 5-325 MG tablet Commonly known as: PERCOCET/ROXICET   triamcinolone 0.1 % Commonly known as: KENALOG     TAKE these medications   acetaminophen 325 MG tablet  Commonly known as: TYLENOL Take 1.5 tablets (487.5 mg total) by mouth every 6 (six) hours. What changed:   medication strength  how much to take  when to take this  reasons to take this   enoxaparin 80 MG/0.8ML injection Commonly known as: LOVENOX Inject 0.8 mLs (80 mg total) into the skin daily for 4 days. Start taking on: June 17, 2020   fentaNYL 25 MCG/HR Commonly known as: [place] 1 patch onto the skin every 3 (three) days.   gabapentin 300 MG capsule Commonly known as: NEURONTIN Take 1 capsule (300 mg total) by mouth 3 (three) times daily.   HumaLOG KwikPen 200 UNIT/ML KwikPen Generic drug: insulin lispro Inject 3 Units into the skin with breakfast, with lunch, and with evening meal. What changed: how much to take   Insulin Pen Needle 31G X 5 MM Misc Commonly known as: B-D UF III MINI PEN NEEDLES USE TO ADMINISTER INSULIN FOUR TIMES A DAY DX E11.9   levothyroxine 25 MCG  tablet Commonly known as: SYNTHROID TAKE 2 TABLETS (50 MCG TOTAL) BY MOUTH DAILY.   lidocaine 5 % ointment Commonly known as: XYLOCAINE Apply 1 application topically 4 (four) times daily as needed. What changed:   when to take this  reasons to take this   linezolid 600 MG tablet Commonly known as: ZYVOX Take 1 tablet (600 mg total) by mouth every 12 (twelve) hours for 3 days.   metoprolol tartrate 25 MG tablet Commonly known as: LOPRESSOR Take 1 tablet (25 mg total) by mouth 2 (two) times daily.   OneTouch Delica Lancets 08U Misc Use to check blood sugars three times a day DX E11.9   OneTouch Verio test strip Generic drug: glucose blood USE TO TEST 3 TIMES DAILY. DX E11.9   oxyCODONE 5 MG immediate release tablet Commonly known as: Oxy IR/ROXICODONE Take 1 tablet (5 mg total) by mouth every 4 (four) hours as needed for moderate pain.   OXYGEN Inhale 3 L into the lungs continuous.   potassium chloride SA 20 MEQ tablet Commonly known as: Klor-Con M20 Take 1 tablet (20 mEq total) by mouth daily.   predniSONE 5 MG tablet Commonly known as: DELTASONE Take 5 mg by mouth daily.   rosuvastatin 20 MG tablet Commonly known as: CRESTOR Take 1 tablet (20 mg total) by mouth daily.   Santyl ointment Generic drug: collagenase Apply 1 application topically daily.   Systane Balance 0.6 % Soln Generic drug: Propylene Glycol Place 1-2 drops into both eyes 3 (three) times daily as needed (for dryness).   tacrolimus 1 MG capsule Commonly known as: PROGRAF Take 3 mg by mouth 2 (two) times daily.   torsemide 100 MG tablet Commonly known as: DEMADEX TAKE 1 TABLET BY MOUTH EVERY DAY What changed: how much to take   Toujeo SoloStar 300 UNIT/ML Solostar Pen Generic drug: insulin glargine (1 Unit Dial) Inject 15 Units into the skin every morning. Titrate up by 1 unit a day if needed for goal sugars of 100-130 up to 50 units a day max What changed: how much to take   warfarin  5 MG tablet Commonly known as: COUMADIN Take as directed. If you are unsure how to take this medication, talk to your nurse or doctor. Original instructions: Take 0.5-1 tablets (2.5-5 mg total) by mouth See admin instructions. Take 2.5 mg every day except Thursday take 5 mg at bedtime Start taking on: June 17, 2020 What changed: See the new instructions.  Durable Medical Equipment  (From admission, onward)         Start     Ordered   06/14/20 1428  For home use only DME Hospital bed  Once       Question Answer Comment  Length of Need Lifetime   Patient has (list medical condition): CAD s/p stents, severe AS s/p AVR, OSA/COPD/chronic RF with as needed O2, diastolic CHF, A. Fib s/p maze on warfarin, DM-2, ESRD s/p transplant now with CKD-4, adenocarcinoma of lung, embolic CVA with residual aphasia and BLE nonhealing wounds   The above medical condition requires: Patient requires the ability to reposition frequently   Bed type Semi-electric   Hoyer Lift Yes   Support Surface: Gel Overlay      06/14/20 1429           Discharge Care Instructions  (From admission, onward)         Start     Ordered   06/16/20 0000  Discharge wound care:       Comments: As above   06/16/20 1439         Allergies  Allergen Reactions  . Ibuprofen Nausea And Vomiting  . Sulfamethoxazole-Trimethoprim Itching, Swelling and Rash    Swelling of the face  . Sulfonamide Derivatives Itching, Swelling and Rash    Swelling of the face  . Tape Rash    Paper tape is ok  . Tramadol Nausea And Vomiting  . Doxycycline Nausea Only  . Hydrocil [Psyllium] Nausea And Vomiting  . Bactrim Itching, Swelling and Rash  . Red Dye Itching and Rash    Follow-up Information    [name], [name], MD. Schedule an appointment as soon as possible for a visit in 2 week(s).   Specialty: Internal Medicine Why: f/u in 1-2 weeks Contact information: [place]  [address] [phone]        Lelon Perla, MD. Schedule an appointment as soon as possible for a visit in 2 week(s).   Specialty: Cardiology Contact information: [address] [place] [address] [phone]        Palliative care Follow up.        [facility] Follow up on 06/18/2020.   Specialty: Cardiology Why: coumadin clinic Contact information: [address] [place] [phone]               The results of significant diagnostics from this hospitalization (including imaging, microbiology, ancillary and laboratory) are listed below for reference.    Significant Diagnostic Studies: CT ABDOMEN PELVIS WO CONTRAST  Result Date: 06/03/2020 CLINICAL DATA:  Generalized abdominal pain. Fever. Tachycardia. Renal transplant patient. Personal history of lung carcinoma. EXAM: CT ABDOMEN AND PELVIS WITHOUT CONTRAST TECHNIQUE: Multidetector CT imaging of the abdomen and pelvis was performed following the standard protocol without IV contrast. COMPARISON:  PET-CT on 11/03/2017 FINDINGS: Lower chest: No acute findings.  Stable cardiomegaly. Hepatobiliary: No mass visualized on this unenhanced exam. Prior cholecystectomy. No evidence of biliary obstruction. Pancreas: No mass or inflammatory process visualized on this unenhanced exam. Spleen:  Within normal limits in size. Adrenals/Urinary tract: A 1.9 cm right adrenal mass is stable, consistent with benign adenoma. Native left kidney is absent. Diffuse atrophy of the native right kidney is seen which contains a 2 cm fluid attenuation cyst. Renal transplant is seen in the left iliac fossa. Several calculi are seen in the lower pole of the renal transplant, largest measuring 9  mm. No evidence of transplant hydronephrosis or perinephric fluid collections. Unremarkable unopacified urinary bladder. Stomach/Bowel: No evidence of obstruction, inflammatory process, or abnormal  fluid collections. Diverticulosis is seen mainly involving the sigmoid colon, however there is no evidence of diverticulitis. Vascular/Lymphatic: No pathologically enlarged lymph nodes identified. No evidence of abdominal aortic aneurysm. Aortic and diffuse peripheral vascular atherosclerotic calcification noted. Reproductive: Prior hysterectomy noted. Adnexal regions are unremarkable in appearance. Other:  New diffuse body wall edema since prior exam. Musculoskeletal:  No suspicious bone lesions identified. IMPRESSION: New diffuse body wall edema. Left iliac fossa renal transplant, with nephrolithiasis. No evidence of transplant hydronephrosis or perinephric fluid collections. Colonic diverticulosis, without radiographic evidence of diverticulitis. Stable benign right adrenal adenoma. Aortic Atherosclerosis (ICD10-I70.0). Electronically Signed   By: Marlaine Hind M.D.   On: 06/03/2020 08:50   CT HEAD WO CONTRAST  Result Date: 06/03/2020 CLINICAL DATA:  Altered mental status.  History of lung carcinoma EXAM: CT HEAD WITHOUT CONTRAST TECHNIQUE: Contiguous axial images were obtained from the base of the skull through the vertex without intravenous contrast. COMPARISON:  Head CT June 19, 2008 and brain MRI June 20, 2008 FINDINGS: Brain: The ventricles and sulci are within normal limits for age. There is no appreciable intracranial mass, hemorrhage, extra-axial fluid collection, or midline shift. There is evidence of a prior infarct at the left posterior temporal-superior anterior occipital-anterior inferior left parietal junction. Elsewhere there is minimal small vessel disease in the centra semiovale bilaterally. No acute infarct is appreciable. Vascular: No hyperdense vessel. There is calcification in each carotid siphon region. Skull: Bony calvarium appears intact. 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 Other visualized paranasal sinuses are  clear. Orbits appear symmetric bilaterally. Other: Mastoid air cells are clear. IMPRESSION: Prior infarct at the junction of the left temporal-parietal, and occipital lobes. Minimal small vessel disease elsewhere. No acute infarct appreciable. No mass or hemorrhage. Foci of arterial vascular calcification noted. Electronically Signed   By: Lowella Grip III M.D.   On: 06/03/2020 08:44   CT TIBIA FIBULA LEFT WO CONTRAST  Result Date: 06/05/2020 CLINICAL DATA:  Open wounds involving the right lower extremity and diffuse soft tissue swelling, redness and pain. EXAM: CT OF THE LOWER LEFT EXTREMITY WITHOUT CONTRAST TECHNIQUE: Multidetector CT imaging of the lower left extremity was performed according to the standard protocol. COMPARISON:  None. FINDINGS: There are open wounds noted involving the anterior aspect of the left lower extremity. There is a fairly deep 3.7 cm long soft tissue defect involving the anterior aspect at the mid tibial level. Slightly more inferiorly and medially there is a second lesion measuring 18 mm. Diffuse and fairly marked skin thickening, subcutaneous soft tissue swelling/edema/fluid consistent with cellulitis. I do not see a discrete fluid collection to suggest a drainable soft tissue abscess. Although difficult with CT I believe there is also diffuse myofasciitis without obvious findings for pyomyositis. No findings suspicious for septic arthritis at the knee or ankle joints. Extensive vascular calcifications are noted. IMPRESSION: 1. Open wounds involving the anterior aspect of the left lower extremity with 2 areas of deep ulceration as described above. 2. Diffuse and fairly marked skin thickening, subcutaneous soft tissue swelling/edema/fluid consistent with cellulitis. 3. No discrete fluid collection to suggest a drainable soft tissue abscess. 4. Suspect diffuse myofasciitis without obvious findings for pyomyositis. 5. No findings suspicious for septic arthritis at the knee or  ankle joints. 6. Extensive vascular calcifications. Electronically Signed  By: Marijo Sanes M.D.   On: 06/05/2020 14:47   PERIPHERAL VASCULAR CATHETERIZATION  Result Date: 05/30/2020 Procedure: Abdominal aortogram with bilateral lower extremity runoff using carbon dioxide contrast Preoperative diagnosis: Nonhealing wounds bilateral lower extremities Postoperative diagnosis: Same Anesthesia: Local Operative findings: 1.  Bilateral flush superficial femoral artery occlusions 2.  One-vessel very diseased calcified peroneal runoff bilaterally Operative details: After obtaining informed consent the patient was taken to the [name] lab.  The patient was placed in supine position angio table.  Both groins were prepped and draped in usual sterile fashion.  Ultrasound was used to identify the right common femoral artery and femoral bifurcation.  There was heavy calcification of the vessel under ultrasound.  After several attempts using an introducer needle to get in the right common femoral artery I was able to finally successfully cannulate this with a micropuncture needle and the micropuncture wire advanced into the right iliac system under fluoroscopy.  Micropuncture sheath was then advanced over the guidewire.  Guidewire was removed and the wire swapped out for an 035 versa core wire which was advanced in the abdominal aorta.  Micropuncture sheath was then removed over the wire and swapped out for a 5 [place] straight short sheath.  This was thoroughly flushed with heparinized saline.  A 5 French pigtail catheter was advanced over the guidewire into the abdominal aorta and an abdominal aortogram obtained using carbon dioxide contrast.  The infrarenal abdominal aorta is heavily calcified but patent.  The left and right common iliac arteries are heavily calcified but patent.  Next the pigtail catheter was pulled down just above the aortic bifurcation and bilateral oblique views of the pelvis were performed.  This again  shows heavy calcification but the internal and external iliac arteries are patent.  A left renal transplant renal artery is visualized coming off the external iliac artery and although CO2 contrast use this is patent it is difficult to determine whether or not there is any narrowing of this.  Next the pigtail catheter was removed over a guidewire and a 5 [place] crossover catheter used to selectively catheterize the left common iliac artery and then advance an 035 angled Glidewire down into the left common femoral artery.  An 035 quick cross was then advanced over the Glidewire into the left common femoral artery and the Glidewire swapped out for an 035 versa core wire.  I was then able to advance a 5 [place] straight catheter up and over the aortic bifurcation and down into the distal left external iliac artery.  Angiogram using carbon dioxide contrast was then performed the left lower extremity.  The left common femoral artery is heavily calcified.  The profunda is calcified but patent.  There is a flush occlusion of the left superficial femoral artery.  There is then reconstitution of the below-knee popliteal artery with one-vessel runoff via the peroneal artery. At this point the 5 French straight catheter was removed over guidewire and right lower extremity views were obtained through the sheath.  The right common femoral artery is heavily diseased with a 50% narrowing.  The right superficial femoral artery has a flush occlusion at the origin.  The right profunda is patent.  There are minimal collaterals in the right thigh.  The right below-knee popliteal artery does reconstitute via collaterals and there is one-vessel peroneal runoff to the right foot. At this point the procedure was concluded.  The patient tolerated procedure well and there were no complications.  The patient was taken to  the holding area in stable condition.  5 French sheath will be removed in the holding area. Operative management: Patient  overall is a very poor operative candidate.  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  She is not a candidate for percutaneous intervention because she has a flush SFA occlusion on both sides.  Her only options at this point would be continued local wound care to see if the wounds will heal.  These have not healed in several months so if eventually she decides the pain from the wounds or the wound burden is too much for her she would be a candidate for bilateral or unilateral above-knee amputation.  I discussed all of this with the patient and her husband today.  She will follow up with me on an as-needed basis. Ruta Hinds, MD [facility] Office: [phone]  DG Chest Port 1 View  Result Date: 06/03/2020 CLINICAL DATA:  Suspected sepsis EXAM: PORTABLE CHEST 1 VIEW COMPARISON:  10/01/2019 FINDINGS: Cardiomegaly. Aortic valve replacement and left atrial clipping. Congested appearance of central vessels with diffuse interstitial opacity and cephalized blood flow. No visible effusion or pneumothorax IMPRESSION: CHF pattern. Electronically Signed   By: Monte Fantasia M.D.   On: 06/03/2020 04:55   ECHOCARDIOGRAM COMPLETE  Result Date: 06/03/2020    ECHOCARDIOGRAM REPORT   Patient Name:   [name] Date of Exam: 06/03/2020 Medical Rec #:  [id number]    Height:       60.0 in Accession #:    [id number]   Weight:       180.8 lb Date of Birth:  05-04-1946    BSA:          1.788 m Patient Age:    74 years     BP:           136/96 mmHg Patient Gender: F            HR:           96 bpm. Exam Location:  Inpatient Procedure: 2D Echo, Cardiac Doppler and Color Doppler                               MODIFIED REPORT: This report was modified by Cherlynn Kaiser MD on 06/03/2020 due to revision.  Indications:     I50.33 Acute on chronic diastolic (congestive) heart failure;                   Fever 780.6 / R50.9  History:         Patient has prior history of Echocardiogram examinations, most                  recent 10/02/2019. Previous Myocardial Infarction, COPD and                  Carotid Disease, Aortic Valve Disease, Arrythmias:Atrial                  Fibrillation; Risk Factors:Hypertension, Former Smoker,                  Dyslipidemia, Diabetes and GERD. Sepsis.                  Aortic Valve: 23 mm Edwards bovine valve is present in the  aortic position. Procedure Date: 04/07/2013.  Sonographer:     Jonelle Sidle Dance Referring Phys:  [id number] Harold Hedge Diagnosing Phys: Cherlynn Kaiser MD IMPRESSIONS  1. Left ventricular ejection fraction, by estimation, is 55 to 60%. The left ventricle has normal function. The left ventricle demonstrates regional wall motion abnormalities (see scoring diagram/findings for description). There is mild left ventricular  hypertrophy. Left ventricular diastolic parameters are indeterminate.  2. Right ventricular systolic function is moderately reduced. The right ventricular size is moderately enlarged. There is severely elevated pulmonary artery systolic pressure. The estimated right ventricular systolic pressure is 58.8 mmHg.  3. Left atrial size was severely dilated.  4. Right atrial size was moderately dilated.  5. Suspect small flail segment on anterior mitral leaflet causing very eccentric posteriorly directed mitral valve regurgitation that appears at least moderate-severe. From short axis view may involve A3 scallop.. The mitral valve is abnormal. Moderate to severe mitral valve regurgitation. Severe mitral annular calcification.  6. Tricuspid valve regurgitation is severe.  7. The aortic valve has been repaired/replaced. Aortic valve regurgitation is not visualized. There is a 23 mm Edwards bovine valve present in the aortic position. Procedure Date: 04/07/2013. Echo findings are consistent with normal structure and function of the aortic valve  prosthesis. Aortic valve mean gradient measures 18.2 mmHg.  8. The inferior vena cava is dilated in size with <50% respiratory variability, suggesting right atrial pressure of 15 mmHg. Comparison(s): A prior study was performed on 10/02/19. Prior images reviewed side by side. Mitral regurgitation may not have significantly changed, but is more evident on today's exam. Consider TEE for further evaluation of mitral valve morphology and severity of regurgitation. No significant change in aortic valve prosthesis. FINDINGS  Left Ventricle: Left ventricular ejection fraction, by estimation, is 55 to 60%. The left ventricle has normal function. The left ventricle demonstrates regional wall motion abnormalities. The left ventricular internal cavity size was normal in size. There is mild left ventricular hypertrophy. Left ventricular diastolic parameters are indeterminate.  LV Wall Scoring: The posterior wall and basal inferior segment are hypokinetic. Right Ventricle: The right ventricular size is moderately enlarged. No increase in right ventricular wall thickness. Right ventricular systolic function is moderately reduced. There is severely elevated pulmonary artery systolic pressure. The tricuspid regurgitant velocity is 3.82 m/s, and with an assumed right atrial pressure of 15 mmHg, the estimated right ventricular systolic pressure is 50.2 mmHg. Left Atrium: Left atrial size was severely dilated. Right Atrium: Right atrial size was moderately dilated. Pericardium: There is no evidence of pericardial effusion. Mitral Valve: Suspect small flail segment on anterior mitral leaflet causing very eccentric posteriorly directed mitral valve regurgitation that appears at least moderate-severe. From short axis view may involve A3 scallop. The mitral valve is abnormal. There is moderate calcification of the mitral valve leaflet(s). Severe mitral annular calcification. Moderate to severe mitral valve regurgitation. Tricuspid Valve:  Systolic reversals in hepatic vein Doppler suggest severe TR. The tricuspid valve is normal in structure. Tricuspid valve regurgitation is severe. Aortic Valve: Grossly normal aortic valve prosthesis. Vmax 3 m/s, DVI 0.37. AT 77 msec, EOA 1.52 cm2, iEOA 0.85 cm2/m2. The aortic valve has been repaired/replaced. Aortic valve regurgitation is not visualized. Aortic valve mean gradient measures 18.2 mmHg. Aortic valve peak gradient measures 35.5 mmHg. Aortic valve area, by VTI measures 1.54 cm. There is a 23 mm Edwards bovine valve present in the aortic position. Procedure Date: 04/07/2013. Echo findings are consistent with normal structure and function of the aortic valve  prosthesis. Pulmonic Valve: The pulmonic valve was grossly normal. Pulmonic valve regurgitation is mild to moderate. Aorta: The aortic root was not well visualized. Venous: The inferior vena cava is dilated in size with less than 50% respiratory variability, suggesting right atrial pressure of 15 mmHg. IAS/Shunts: No atrial level shunt detected by color flow Doppler.  LEFT VENTRICLE PLAX 2D LVIDd:         4.90 cm LVIDs:         4.00 cm LV PW:         1.50 cm LV IVS:        1.00 cm LVOT diam:     2.30 cm LV SV:         71 LV SV Index:   40 LVOT Area:     4.15 cm  RIGHT VENTRICLE          IVC RV Basal diam:  4.40 cm  IVC diam: 2.80 cm RV Mid diam:    3.00 cm TAPSE (M-mode): 1.1 cm LEFT ATRIUM             Index       RIGHT ATRIUM           Index LA diam:        4.30 cm 2.40 cm/m  RA Area:     22.90 cm LA Vol (A2C):   92.8 ml 51.90 ml/m RA Volume:   74.80 ml  41.83 ml/m LA Vol (A4C):   76.7 ml 42.89 ml/m LA Biplane Vol: 84.0 ml 46.98 ml/m  AORTIC VALVE AV Area (Vmax):    1.25 cm AV Area (Vmean):   1.19 cm AV Area (VTI):     1.54 cm AV Vmax:           297.95 cm/s AV Vmean:          202.608 cm/s AV VTI:            0.465 m AV Peak Grad:      35.5 mmHg AV Mean Grad:      18.2 mmHg LVOT Vmax:         89.55 cm/s LVOT Vmean:        58.100 cm/s LVOT  VTI:          0.172 m LVOT/AV VTI ratio: 0.37 MITRAL VALVE                TRICUSPID VALVE MV Area (PHT): 3.60 cm     TR Peak grad:   58.4 mmHg MV Decel Time: 211 msec     TR Vmax:        382.00 cm/s MV E velocity: 152.50 cm/s                             SHUNTS                             Systemic VTI:  0.17 m                             Systemic Diam: 2.30 cm Cherlynn Kaiser MD Electronically signed by Cherlynn Kaiser MD Signature Date/Time: 06/03/2020/3:01:49 PM    Final (Updated)    VAS [place] UPPER EXTREMITY VENOUS DUPLEX  Result Date: 06/07/2020 UPPER VENOUS STUDY  Indications: Swelling Comparison Study: 10/04/19 previous Performing Technologist: Abram Sander RVS  Examination Guidelines: A complete  evaluation includes B-mode imaging, spectral Doppler, color Doppler, and power Doppler as needed of all accessible portions of each vessel. Bilateral testing is considered an integral part of a complete examination. Limited examinations for reoccurring indications may be performed as noted.  Left Findings: +----------+------------+---------+-----------+----------+-------+ LEFT      CompressiblePhasicitySpontaneousPropertiesSummary +----------+------------+---------+-----------+----------+-------+ IJV           Full       Yes       Yes                      +----------+------------+---------+-----------+----------+-------+ Subclavian    Full       Yes       Yes                      +----------+------------+---------+-----------+----------+-------+ Axillary      Full       Yes       Yes                      +----------+------------+---------+-----------+----------+-------+ Brachial                 Yes       Yes                      +----------+------------+---------+-----------+----------+-------+ Radial        Full                                          +----------+------------+---------+-----------+----------+-------+ Ulnar         Full                                           +----------+------------+---------+-----------+----------+-------+ Cephalic      Full                                          +----------+------------+---------+-----------+----------+-------+ Basilic                                             fistula +----------+------------+---------+-----------+----------+-------+  Summary:  Left: No evidence of deep vein thrombosis in the upper extremity. No evidence of superficial vein thrombosis in the upper extremity. No evidence of thrombosis in the subclavian.  *See table(s) above for measurements and observations.  Diagnosing physician: Deitra Mayo MD Electronically signed by Deitra Mayo MD on 06/07/2020 at 7:53:42 PM.    Final     Microbiology: No results found for this or any previous visit (from the past 240 hour(s)).   Labs: Basic Metabolic Panel: Recent Labs  Lab 06/10/20 0414 06/10/20 0414 06/11/20 0453 06/11/20 0453 06/12/20 0358 06/13/20 0439 06/14/20 0412 06/15/20 0516 06/16/20 0339  NA 139   < > 138   < > 137 137 139 140 142  K 3.8   < > 3.7   < > 3.6 3.6 3.6 3.7 4.0  CL 96*   < > 97*   < > 96* 96* 97* 97* 98  CO2 30   < > 29   < >  32 31 32 31 33*  GLUCOSE 168*   < > 144*   < > 247* 216* 183* 151* 113*  BUN 46*   < > 42*   < > 50* 51* 50* 50* 53*  CREATININE 2.66*   < > 2.24*   < > 2.52* 2.42* 2.53* 2.48* 2.63*  CALCIUM 9.9   < > 9.8   < > 9.7 10.1 10.1 10.3 10.1  MG 1.9  --  2.0  --   --   --   --   --   --   PHOS 2.8  --  2.9  --  3.2 2.9  --   --   --    < > = values in this interval not displayed.   Liver Function Tests: Recent Labs  Lab 06/10/20 0414 06/11/20 0453 06/12/20 0358 06/13/20 0439  ALBUMIN 2.4* 2.3* 2.3* 2.5*   No results for input(s): LIPASE, AMYLASE in the last 168 hours. No results for input(s): AMMONIA in the last 168 hours. CBC: Recent Labs  Lab 06/10/20 0414 06/10/20 0414 06/11/20 0453 06/11/20 0453 06/12/20 0358 06/13/20 0439 06/14/20 0412  06/15/20 0516 06/16/20 0339  WBC 9.7   < > 11.2*  --  11.7* 11.0* 10.1 8.5  --   NEUTROABS 8.1*  --   --   --   --   --   --   --   --   HGB 11.3*   < > 11.0*   < > 10.5* 10.9* 10.6* 10.3* 10.3*  HCT 37.7   < > 36.5   < > 33.7* 35.0* 34.8* 33.6* 35.0*  MCV 78.2*   < > 79.0*  --  77.5* 77.3* 78.2* 76.4*  --   PLT 220   < > 219  --  212 209 194 207  --    < > = values in this interval not displayed.   Cardiac Enzymes: No results for input(s): CKTOTAL, CKMB, CKMBINDEX, TROPONINI in the last 168 hours. BNP: BNP (last 3 results) Recent Labs    10/01/19 1724 06/03/20 0619  BNP 2,092.5* 1,883.6*    ProBNP (last 3 results) No results for input(s): PROBNP in the last 8760 hours.  CBG: Recent Labs  Lab 06/15/20 1207 06/15/20 1555 06/15/20 2027 06/16/20 0748 06/16/20 1146  GLUCAP 120* 155* 115* 160* 187*       Signed:  Irine Seal MD.  [facility] 06/16/2020, 2:50 PM

## 2020-06-16 NOTE — TOC Transition Note (Signed)
Transition of Care [facility]) - CM/SW Discharge Note   Patient Details  Name: [name] MRN: [id number] Date of Birth: May 28, 1946  Transition of Care [facility] CM/SW Contact:  Trish Mage, LCSW Phone Number: 06/16/2020, 3:59 PM   Clinical Narrative:   Patient scheduled for d/c today-returning home with services of [facility].  Orders seen and appreciated.  Spoke with husband who is awaiting delivery of hospital bed.  When we have confirmation of same, can call [facility] for transportation home. 5W unit Network engineer to call [facility]. TOC sign off.    Final next level of care: [place] Barriers to Discharge: Barriers Resolved   Patient Goals and CMS Choice Patient states their goals for this hospitalization and ongoing recovery are:: to go home CMS Medicare.gov Compare Post Acute Care list provided to:: Patient Choice offered to / list presented to : Spouse  Discharge Placement                       Discharge Plan and Services   Discharge Planning Services: CM Consult Post Acute Care Choice: [place]          DME Arranged: Hospital bed (hoyer lift) DME Agency: [employer] Date DME Agency Contacted: 06/14/20 Time DME Agency Contacted: [phone] Representative spoke with at DME Agency: [place]: RN, PT, OT, Nurse's Aide, Social Work [employer] Agency: [place] Date [place]: 06/13/20 Time [place]: 1537 Representative spoke with at [place]: [place] ([place]) Interventions     Readmission Risk Interventions Readmission Risk Prevention Plan 10/03/2019  Transportation Screening Complete  Medication Review Press photographer) Complete  HRI or [place] Complete  SW Recovery Care/Counseling Consult Complete  [place] Not Applicable  Some recent data might be hidden

## 2020-06-16 NOTE — Progress Notes (Signed)
Daily Progress Note   Patient Name: [name]       Date: 06/16/2020 DOB: 02/10/1946  Age: 74 y.o. MRN#: [id number] Attending Physician: Eugenie Filler, MD Primary Care Physician: [name], MD Admit Date: 06/03/2020  Reason for Consultation/Follow-up: Establishing goals of care and Pain control  Subjective:  Patient is awake alert, PO intake is adequate, denies complaints, states she wants to go home towards the end of this hospitalization, anticipating discharge today, she states that she doesn't want SNF. She is thankful for the care she has received here in the hospital.     Length of Stay: 13  Current Medications: Scheduled Meds:   acetaminophen  650 mg Oral Q6H   collagenase   Topical Daily   enoxaparin (LOVENOX) injection  1 mg/kg Subcutaneous Q24H   feeding supplement  237 mL Oral TID BM   fentaNYL  1 patch Transdermal Q72H   insulin aspart  0-5 Units Subcutaneous QHS   insulin aspart  0-6 Units Subcutaneous TID WC   insulin aspart  2 Units Subcutaneous TID WC   insulin glargine  14 Units Subcutaneous Daily   levothyroxine  50 mcg Oral Q0600   linezolid  600 mg Oral Q12H   metoprolol tartrate  25 mg Oral BID   predniSONE  5 mg Oral QAC breakfast   sodium chloride flush  3 mL Intravenous Q12H   tacrolimus  3 mg Oral BID   torsemide  100 mg Oral Daily   warfarin  5 mg Oral ONCE-1600   Warfarin - Pharmacist Dosing Inpatient   Does not apply q1600    Continuous Infusions:   PRN Meds: oxyCODONE, polyethylene glycol  Physical Exam    General:  awake alert responds appropriately.  HEENT: No bruits, no goiter, no JVD Heart: Regular rate and rhythm. No murmur appreciated. Lungs: Good air movement, clear Abdomen: Soft, nontender,  nondistended, positive bowel sounds.  Skin: Warm and dry Neuro:  more alert today      Ext:  LLE has 2 ulcers on shin, also has blister and ulcer on dorsum of foot.   Vital Signs: BP 128/61 (BP Location: Right Arm)    Pulse 71    Temp 98.3 F (36.8 C) (Oral)    Resp 18    Ht 5\' 1"  (1.549 m)  Wt 82.6 kg    SpO2 98%    BMI 34.41 kg/m  SpO2: SpO2: 98 % O2 Device: O2 Device: Nasal Cannula O2 Flow Rate: O2 Flow Rate (L/min): 2 L/min  Intake/output summary:   Intake/Output Summary (Last 24 hours) at 06/16/2020 1048 Last data filed at 06/16/2020 7001 Gross per 24 hour  Intake 3 ml  Output 600 ml  Net -597 ml   LBM: Last BM Date: 06/13/20 Baseline Weight: Weight: 82 kg Most recent weight: Weight: 82.6 kg       Palliative Assessment/Data:    Flowsheet Rows     Most Recent Value  Intake Tab  Referral Department Hospitalist  Unit at Time of Referral Med/Surg Unit  Date Notified 06/03/20  Palliative Care Type New Palliative care  Reason for referral Clarify Goals of Care  Date of Admission 06/03/20  Date first seen by Palliative Care 06/04/20  # of days Palliative referral response time 1 Day(s)  # of days IP prior to Palliative referral 0  Clinical Assessment  Psychosocial & Spiritual Assessment  Palliative Care Outcomes      Patient Active Problem List   Diagnosis Date Noted   Cellulitis of both lower extremities    Ulcer of left lower extremity with fat layer exposed ([place])    Urinary tract infection without hematuria    Palliative care by specialist    Goals of care, counseling/discussion    General weakness    Pulmonary hypertension, unspecified ([place])    Atrial fibrillation with rapid ventricular response ([place]) 06/03/2020   Sepsis ([place]) 06/03/2020   ESRD (end stage renal disease) ([place])    Venous stasis ulcers of both lower extremities ([place]) 01/31/2020   Venous stasis dermatitis of both lower extremities 01/31/2020   Claudication of both lower  extremities ([place]) 01/31/2020   Chronic venous insufficiency 12/31/2019   Heart failure ([place]) 10/02/2019   Dyspnea 08/20/2019   COPD (chronic obstructive pulmonary disease) ([place]) 08/20/2019   Hematoma of arm, left, subsequent encounter 07/23/2019   Obesity (BMI 30-39.9) 12/20/2018   Acute respiratory failure with hypoxia ([place]) 12/19/2018   Gait disorder 11/22/2018   History of seizure disorder 09/12/2018   History of stroke 09/12/2018   Edema 08/28/2018   Hypertension secondary to other renal disorders 03/22/2018   Proteinuria 03/22/2018   Malignant neoplasm of lung ([place]) 03/22/2018   Breast pain in female 02/04/2018   Lung cancer ([place]) 11/30/2017   Adenocarcinoma of lung, stage 1, right ([place]) 11/25/2017   Chronic respiratory failure with hypoxia ([place]) 12/27/2016   Acute on chronic diastolic CHF (congestive heart failure) ([place]) 12/27/2016   Left upper extremity swelling 12/27/2016   Pain of left breast 12/27/2016   Arm muscle atrophy 11/16/2016   Insomnia 02/25/2016   Renal cyst 11/21/2015   Renal mass, right 11/10/2015   Shoulder pain, right 08/13/2015   Weight gain 08/12/2014   Multinodular goiter 05/01/2014   Encounter for therapeutic drug monitoring 08/14/2013   S/P aortic valve replacement with bioprosthetic valve and maze procedure 04/12/2013   Nodule of right lung 04/07/2013   Nodule of left lung 04/07/2013   Pain in limb 05/22/2012   Dermatitis 01/26/2012   Long term (current) use of anticoagulants 08/17/2011   Anemia in chronic renal disease 05/13/2011   Hypokalemia 04/26/2011   Immunosuppression ([place]) 04/26/2011   Hypertension associated with diabetes ([place]) 04/26/2011   Renal transplant recipient 03/16/2011   Low back pain 02/23/2011   Hypersalivation 11/24/2010   AORTIC STENOSIS 08/20/2010   DISTURBANCE  OF SALIVARY SECRETION 07/28/2010   NAUSEA 04/14/2010   SMOKER 10/07/2009   ALOPECIA 10/07/2009   CAROTID  STENOSIS 03/04/2009   AF (paroxysmal atrial fibrillation) ([place]) 12/31/2008   NEOPLASM, MALIGNANT, KIDNEY 07/02/2008   APHASIA DUE TO CEREBROVASCULAR DISEASE 07/02/2008   Chronic fatigue 05/21/2008   Hyperlipidemia LDL goal <70 09/27/2007   Constipation 09/27/2007   SLEEP APNEA 09/27/2007   CHOLELITHIASIS 63/87/5643   HELICOBACTER PYLORI INFECTION, HX OF 06/08/2007   Type 2 diabetes mellitus with chronic kidney disease, with long-term current use of insulin ([place]) 05/23/2007   Gout 05/23/2007   Hypertension due to kidney transplant 05/23/2007   MYOCARDIAL INFARCTION, HX OF 05/23/2007   Coronary atherosclerosis 05/23/2007   GERD 05/23/2007   COLONIC POLYPS, HX OF 05/23/2007   DIVERTICULOSIS, COLON 07/01/2005    Palliative Care Assessment & Plan   Patient Profile: 74 y.o. female  with past medical history of CAD status post stent, severe aortic stenosis status post AVR, hypertension, hyperlipidemia, OSA, COPD with intermittent O2 requirement, HFpEF, atrial fibrillation status post maze on Coumadin, type 2 diabetes, end-stage renal disease status post transplant, CKD 4, adenocarcinoma of the lung, embolic CVA with residual aphasia, bilateral lower extremity nonhealing wounds and recent abdominal aortogram with out actionable findings (would likely need eventual AKA) admitted on 06/03/2020 with sepsis, concern for endocarditis, A. fib and worsening renal failure.  Palliative consulted for goals of care.  Assessment: Patient Active Problem List   Diagnosis Date Noted   Cellulitis of both lower extremities    Ulcer of left lower extremity with fat layer exposed ([place])    Urinary tract infection without hematuria    Palliative care by specialist    Goals of care, counseling/discussion    General weakness    Pulmonary hypertension, unspecified ([place])    Atrial fibrillation with rapid ventricular response ([place]) 06/03/2020   Sepsis ([place]) 06/03/2020   ESRD (end stage  renal disease) ([place])    Venous stasis ulcers of both lower extremities ([place]) 01/31/2020   Venous stasis dermatitis of both lower extremities 01/31/2020   Claudication of both lower extremities ([place]) 01/31/2020   Chronic venous insufficiency 12/31/2019   Heart failure ([place]) 10/02/2019   Dyspnea 08/20/2019   COPD (chronic obstructive pulmonary disease) ([place]) 08/20/2019   Hematoma of arm, left, subsequent encounter 07/23/2019   Obesity (BMI 30-39.9) 12/20/2018   Acute respiratory failure with hypoxia ([place]) 12/19/2018   Gait disorder 11/22/2018   History of seizure disorder 09/12/2018   History of stroke 09/12/2018   Edema 08/28/2018   Hypertension secondary to other renal disorders 03/22/2018   Proteinuria 03/22/2018   Malignant neoplasm of lung ([place]) 03/22/2018   Breast pain in female 02/04/2018   Lung cancer ([place]) 11/30/2017   Adenocarcinoma of lung, stage 1, right ([place]) 11/25/2017   Chronic respiratory failure with hypoxia ([place]) 12/27/2016   Acute on chronic diastolic CHF (congestive heart failure) ([place]) 12/27/2016   Left upper extremity swelling 12/27/2016   Pain of left breast 12/27/2016   Arm muscle atrophy 11/16/2016   Insomnia 02/25/2016   Renal cyst 11/21/2015   Renal mass, right 11/10/2015   Shoulder pain, right 08/13/2015   Weight gain 08/12/2014   Multinodular goiter 05/01/2014   Encounter for therapeutic drug monitoring 08/14/2013   S/P aortic valve replacement with bioprosthetic valve and maze procedure 04/12/2013   Nodule of right lung 04/07/2013   Nodule of left lung 04/07/2013   Pain in limb 05/22/2012   Dermatitis 01/26/2012   Long term (current) use  of anticoagulants 08/17/2011   Anemia in chronic renal disease 05/13/2011   Hypokalemia 04/26/2011   Immunosuppression ([place]) 04/26/2011   Hypertension associated with diabetes ([place]) 04/26/2011   Renal transplant recipient 03/16/2011   Low back pain 02/23/2011    Hypersalivation 11/24/2010   AORTIC STENOSIS 08/20/2010   DISTURBANCE OF SALIVARY SECRETION 07/28/2010   NAUSEA 04/14/2010   SMOKER 10/07/2009   ALOPECIA 10/07/2009   CAROTID STENOSIS 03/04/2009   AF (paroxysmal atrial fibrillation) ([place]) 12/31/2008   NEOPLASM, MALIGNANT, KIDNEY 07/02/2008   APHASIA DUE TO CEREBROVASCULAR DISEASE 07/02/2008   Chronic fatigue 05/21/2008   Hyperlipidemia LDL goal <70 09/27/2007   Constipation 09/27/2007   SLEEP APNEA 09/27/2007   CHOLELITHIASIS 16/04/9603   HELICOBACTER PYLORI INFECTION, HX OF 06/08/2007   Type 2 diabetes mellitus with chronic kidney disease, with long-term current use of insulin ([place]) 05/23/2007   Gout 05/23/2007   Hypertension due to kidney transplant 05/23/2007   MYOCARDIAL INFARCTION, HX OF 05/23/2007   Coronary atherosclerosis 05/23/2007   GERD 05/23/2007   COLONIC POLYPS, HX OF 05/23/2007   DIVERTICULOSIS, COLON 07/01/2005    Recommendations/Plan: - Pain: fentanyl patch 25 mcg/h  and scheduled on Tylenol 650 four times also has oxycodone 5 mg every 4 hours as needed for breakthrough pain.  Full code, full scope for now.  Home with home health, home PT, home RN, also recommend home based palliative care. She is in agreement.        Goals of Care and Additional Recommendations:  Limitations on Scope of Treatment: Full Scope Treatment  Code Status:    Code Status Orders  (From admission, onward)         Start     Ordered   06/03/20 0919  Full code  Continuous        06/03/20 0918        Code Status History    Date Active Date Inactive Code Status Order ID Comments User Context   05/30/2020 1320 05/30/2020 2355 Full Code [id number]  Elam Dutch, MD Inpatient   10/01/2019 2327 10/11/2019 1851 Full Code [id number]  Etta Quill, DO ED   12/19/2018 2042 12/26/2018 1751 Full Code [id number]  [place] Eric J, DO Inpatient   02/04/2018 0244 02/08/2018 1925 Full Code [id number]  Vianne Bulls, MD  ED   12/27/2016 2146 01/01/2017 1532 Full Code [id number]  Ivor Costa, MD ED   04/12/2013 1330 04/23/2013 1855 Full Code [id number]  Rexene Alberts, MD Inpatient   Advance Care Planning Activity    Advance Directive Documentation     Most Recent Value  Type of Advance Directive Healthcare Power of Attorney  Pre-existing out of facility DNR order (yellow form or pink MOST form) --  "MOST" Form in Place? --       Prognosis:   Unable to determine  Discharge Planning:  Home with home health, home therapies and also recommend home based palliative care.   Care plan was discussed with IDT Thank you for allowing the Palliative Medicine Team to assist in the care of this patient.   Time In: 8.30 Time Out: 8.55 Total Time 25 Prolonged Time Billed No  Greater than 50%  of this time was spent counseling and coordinating care related to the above assessment and plan.  Loistine Chance, MD  Please contact Palliative Medicine Team phone at [phone] for questions and concerns.

## 2020-06-16 NOTE — Progress Notes (Addendum)
Pt has large purple bruise to mid back. Skin is intact. Hemoglobin level have been consistent.  A surgical mark was placed around bruise so home health nurse  and family can continue to monitor. MD was notified.

## 2020-06-16 NOTE — Progress Notes (Signed)
[place] for warfarin Indication: hx bioprosthetic AVR, stroke, afib  Allergies  Allergen Reactions  . Ibuprofen Nausea And Vomiting  . Sulfamethoxazole-Trimethoprim Itching, Swelling and Rash    Swelling of the face  . Sulfonamide Derivatives Itching, Swelling and Rash    Swelling of the face  . Tape Rash    Paper tape is ok  . Tramadol Nausea And Vomiting  . Doxycycline Nausea Only  . Hydrocil [Psyllium] Nausea And Vomiting  . Bactrim Itching, Swelling and Rash  . Red Dye Itching and Rash   Patient Measurements: Height: 5\' 1"  (154.9 cm) Weight: 82.6 kg (182 lb 1.6 oz) IBW/kg (Calculated) : 47.8 Heparin Dosing Weight: 66.4 kg  Vital Signs: Temp: 98.3 F (36.8 C) (11/29 0000) Temp Source: Oral (11/29 0000) BP: 128/61 (11/29 0000) Pulse Rate: 71 (11/29 0000)  Labs: Recent Labs    06/14/20 0412 06/14/20 0412 06/15/20 0516 06/16/20 0339  HGB 10.6*   < > 10.3* 10.3*  HCT 34.8*  --  33.6* 35.0*  PLT 194  --  207  --   LABPROT 14.3  --  15.8* 19.2*  INR 1.2  --  1.3* 1.7*  CREATININE 2.53*  --  2.48* 2.63*   < > = values in this interval not displayed.   Estimated Creatinine Clearance: 17.7 mL/min (A) (by C-G formula based on SCr of 2.63 mg/dL (H)).  Medications:  - on warfarin PTA  Assessment: Patient is a 74 y.o F with hx bioprosthetic AVR, stroke, and afib on warfarin PTA presented to the ED on 11/16 with fever. Pharmacy is consulted to transition to heparin drip on admission.  Pt home warfarin regimen is warfarin 2.5 mg PO daily except 5mg  on thursdays   Today, 06/16/2020:  INR 1.7, Warfarin resumed 11/25  CBC stable  SCr 2.63, CrCl ~ 18 ml/min    No bleeding issues noted per nursing  Patient eating 25% of meal tray + ordered Ensure x3 daily (Drank 2 yesterday)   No major drug-drug interactions noted  Goal of Therapy:  Heparin level 0.3-0.7 units/ml Monitor platelets by anticoagulation protocol: Yes   Target INR 2-3 per Warfarin clinic notes 05/23/2020   Plan:   Enoxaparin 80 mg SQ q24h   Warfarin 5 mg PO x1 today   Monitor closely for s/sx of bleeding  Minda Ditto PharmD [facility] [phone] 06/16/2020, 7:10 AM

## 2020-06-17 ENCOUNTER — Ambulatory Visit (HOSPITAL_COMMUNITY): Payer: Medicare Other

## 2020-06-17 ENCOUNTER — Other Ambulatory Visit: Payer: Self-pay

## 2020-06-17 ENCOUNTER — Telehealth: Payer: Self-pay | Admitting: Medical Oncology

## 2020-06-17 ENCOUNTER — Inpatient Hospital Stay: Payer: Medicare Other | Attending: Internal Medicine

## 2020-06-17 NOTE — Progress Notes (Signed)
Fentanyl 78mcg patch wasted in stericycle witnessed by Corliss Marcus, RN.  Virginia Rochester, RN

## 2020-06-17 NOTE — Telephone Encounter (Signed)
She needs a scan of the chest before I see her.  Thank you.

## 2020-06-17 NOTE — Telephone Encounter (Signed)
No show for 6 month CT chest. 11/16-admitted CHF, UTI Had CT abd , cxr

## 2020-06-18 ENCOUNTER — Telehealth: Payer: Self-pay | Admitting: Internal Medicine

## 2020-06-18 NOTE — Telephone Encounter (Signed)
Scheduled appt per 12/1 sch msg - mailed letter with appt date and time

## 2020-06-18 NOTE — Telephone Encounter (Signed)
d/c from hospital yesterday . He requested to move out her f/u until jan. Schedule message sent.

## 2020-06-19 ENCOUNTER — Other Ambulatory Visit: Payer: Self-pay

## 2020-06-19 ENCOUNTER — Inpatient Hospital Stay: Payer: Medicare Other | Admitting: Internal Medicine

## 2020-06-19 DIAGNOSIS — I739 Peripheral vascular disease, unspecified: Secondary | ICD-10-CM

## 2020-06-20 ENCOUNTER — Encounter (HOSPITAL_BASED_OUTPATIENT_CLINIC_OR_DEPARTMENT_OTHER): Payer: Medicare Other | Attending: Internal Medicine | Admitting: Internal Medicine

## 2020-06-20 ENCOUNTER — Telehealth: Payer: Self-pay | Admitting: Internal Medicine

## 2020-06-20 ENCOUNTER — Other Ambulatory Visit: Payer: Self-pay

## 2020-06-20 ENCOUNTER — Encounter: Payer: Self-pay | Admitting: Internal Medicine

## 2020-06-20 DIAGNOSIS — M25512 Pain in left shoulder: Secondary | ICD-10-CM

## 2020-06-20 DIAGNOSIS — A419 Sepsis, unspecified organism: Secondary | ICD-10-CM | POA: Diagnosis not present

## 2020-06-20 NOTE — Patient Outreach (Addendum)
[facility] [facility]) Care Management  06/20/2020  [name] 04/30/1946 [id number]   New referral: Post hospital discharge for sepsis:  MD office does transition of care.   Placed call to patient to inquire about needs.  Patient reports to me that she is now bed bound in a hospital bed. Reports she is unable to walk.  Reports she needs wound care and 24 hour assistance. Reports sister in law and a friend are helping, Reports husband and grandson had recent surgeries.  [facility] nurse coming today to do assessment.  Patient reports husband is assisting with medications.  Reports she is able to roll from side to side.  Reports CBGs 200.  Reviewed with patient my concern about her care at home. Sister in law is a Theme park manager and will be leaving to return home.  Patient reports this only leaves her husband and grandson. Patient ask that I speak with husband,  [name] answered and I reviewed my concern about who will be able to help take care of patient over the weekend.  Encouraged patient to talk to home health nurse when she arrives. I provided my contact information and ask husband to get nurse to call me.  2pm: no call back from nurse. I called husband back and he reports nurse was going to call me.  Again inquired about plan for care of his wife over the weekend and he has no plan.  Reports he needs his insurance to provide care. I reviewed with husband that Medicare does not pay for private caregivers.  Reviewed his option to hire services for his wife which would be private pay and he states he does not have the funds for this.   He request help in figuring out plan.  I offered to place a referral for [facility] social worker.  Reviewed options of rehab and nursing facility and he does not know what to do.   Husband reports to me home health nurse reviewed all medications and he understands what medications to provide.    Placed call to palliative care, spoke with [facility] who reports no order  received.   PLAN:  Will follow up in week.  Will place urgent referral for potential placement.   Tomasa Rand, RN, BSN, CEN [facility] [employer] [phone]

## 2020-06-20 NOTE — Telephone Encounter (Signed)
  Angel from [employer] and suggested doing wound care on the patients leg ulcers for santyl dry gauze with tape and change it two times a week, if that is okay.   Also the patient in the middle of her back where her left shoulder is it looks like her shoulder blade is going to pop out. Family states this is new and thinks it happened in the hospital and the nurse went over the hospital notes and never saw any documentation about it so she is wondering if we need to do a mobile x-ray, if so order can be faxed to [phone].   Patient was put on lovenox while she was in the hospital and now is back on her coumadin and they are wondering when she needs an INR check.   Family is requesting orders for a foley catheter, RN explained the risk of infection and also did not recommend this but family still wanted her to ask. Family is also wondering if there is anything that could help them with getting transportation for the patient to doctor appointments.  Patient was discharged with order for zyvox 600 mg two times a day and the pharmacy never received it so if you would like her to take this medication it would need to be sent in to [id number] [place], [place] Phone:  [phone]  Fax:  [phone]     Glenard Haring [phone] If unable to reach her because she is only helping in the area call the office at [phone]

## 2020-06-20 NOTE — Patient Outreach (Signed)
[facility]) Care Management  06/20/2020  [name] 01-02-1946 [id number]   Red Emmi:  Date of emmi call:  06/19/2020 Reason for alert:  Scheduled follow up---no  Placed call to patient and reviewed reason for call. Patient reports that she is now bed bound and is not sure how to get transportation to MD appointments.  PLAN: will place order for [facility] social worker to assist with transportation.  Tomasa Rand, RN, BSN, CEN [facility] [employer] [phone]

## 2020-06-23 ENCOUNTER — Other Ambulatory Visit: Payer: Self-pay

## 2020-06-23 ENCOUNTER — Other Ambulatory Visit: Payer: Self-pay | Admitting: Internal Medicine

## 2020-06-23 ENCOUNTER — Encounter: Payer: Self-pay | Admitting: Internal Medicine

## 2020-06-23 DIAGNOSIS — I5033 Acute on chronic diastolic (congestive) heart failure: Secondary | ICD-10-CM

## 2020-06-23 DIAGNOSIS — I480 Paroxysmal atrial fibrillation: Secondary | ICD-10-CM

## 2020-06-23 DIAGNOSIS — J9611 Chronic respiratory failure with hypoxia: Secondary | ICD-10-CM

## 2020-06-23 NOTE — Telephone Encounter (Signed)
Okay.  Thanks.

## 2020-06-23 NOTE — Telephone Encounter (Incomplete)
Okay wound care  Okay mobile x-ray  Check INR this week please.  As I understand she had an appointment with Coumadin clinic on June 18, 2020  Why does she need a Foley catheter?  She was supposed to take Zyvox for 3 more days after discharge.

## 2020-06-23 NOTE — Addendum Note (Signed)
Addended by: Earnstine Regal on: 06/23/2020 04:34 PM   Modules accepted: Orders

## 2020-06-23 NOTE — Telephone Encounter (Signed)
Okay wound care  Okay mobile x-ray  Check INR this week please.  As I understand she had an appointment with Coumadin clinic on June 18, 2020  Why does she need a Foley catheter?  I will know what we can do about transportation help.  She was supposed to take Zyvox for 3 more days after discharge.  If she has not received it right after discharge, no need to get it now.  Thanks,

## 2020-06-23 NOTE — Patient Outreach (Signed)
[place] [facility]) Care Management  06/23/2020  Carly Sabo Feb 06, 1946 [id number]   Red Emmi: Date of call:  06/22/2020 Reason for alert:  Scheduled follow up ---no  Placed call to patient and spoke with husband. Husband reports he was helping wife ( patient) to the potty chair.  Reviewed Red emmi.  I had sent an inbasket message to Dr. Alain Marion last week with a response today.  MD suggest a telephone/virtual visit.  Reviewed with husband to call MD office and schedule. Also noted in medical record that orders to be placed for palliative care. Orders placed for wound care for home health as well.    Reviewed with husband that he has been doing his best to take care of wife but it is overwhelming by himself.  Reviewed with patient that I did placed order for social worker who is scheduled to call patient and or husband by the end of the week.   Husband reports that he is giving patient her medications as prescibed.  Husband states patient is able to pull up on rails and then he standby to assist while she pivots and turns to get on potty chair.  PLAN: follow up within a week. Encouraged husband and patient to discuss level of care as sister will not be back until the end of the week.   Tomasa Rand, RN, BSN, CEN [facility] [employer] [phone]

## 2020-06-23 NOTE — Telephone Encounter (Addendum)
Notified Angel w/MD response. Glenard Haring states we need to fax the order for the xray to [phone]. (L) shoulder blade due to protruding on her back. Ordered xray and printed faxed to # given.  Also the pharmacy never received the Zyvox [facility]

## 2020-06-24 ENCOUNTER — Ambulatory Visit (INDEPENDENT_AMBULATORY_CARE_PROVIDER_SITE_OTHER): Payer: Medicare Other | Admitting: Cardiology

## 2020-06-24 DIAGNOSIS — Z5181 Encounter for therapeutic drug level monitoring: Secondary | ICD-10-CM

## 2020-06-24 DIAGNOSIS — I480 Paroxysmal atrial fibrillation: Secondary | ICD-10-CM | POA: Diagnosis not present

## 2020-06-24 LAB — POCT INR: INR: 1.6 — AB (ref 2.0–3.0)

## 2020-06-26 ENCOUNTER — Encounter: Payer: Self-pay | Admitting: *Deleted

## 2020-06-26 ENCOUNTER — Encounter (HOSPITAL_COMMUNITY): Payer: Medicare Other

## 2020-06-26 ENCOUNTER — Other Ambulatory Visit: Payer: Self-pay | Admitting: *Deleted

## 2020-06-26 ENCOUNTER — Telehealth: Payer: Self-pay

## 2020-06-26 NOTE — Patient Outreach (Signed)
[place] [name]) Care Management  06/26/2020  [name] 03-04-1946 [id number]   CSW made an attempt to try and contact patient today to perform the initial phone assessment, as well as assess and assist with social work needs and services, without success.  Patient's husband, [name] answered the phone stating, "She is not available, but I am her husband so you can talk to me".  CSW was able to verify HIPAA compliant identifiers with Mr. Yearsley, and reviewed patient's chart to confirm that patient has already given verbal consent for [place] to speak with him.  CSW then proceeded to explain the reason for the call.  Mr. Cheyney further stated, "I am expecting a call from my wife's doctor tomorrow so that we can come up with a plan moving forward".  CSW voiced understanding, encouraging Mr. Ramey to contact CSW after he has spoken with patient's Primary Care Physician, Dr. Lew Dawes, or after a decision has been made, willing to offer any type of assistance that is within CSW's scope of practice.  Mr. Friley was agreeable to this plan, taking down CSW's contact information.  Mr. Morro was agreeable to having CSW contact him and patient again next week, on Wednesday, July 02, 2020, around 11:00AM, if CSW has not received a return call from either of them in the meantime.  Nat Christen, BSW, MSW, LCSW  Licensed Education officer, [facility]  [place] [place] [address] [place], [place] [address] Physical Address-[address], [place],  [address] Toll Free Main # [phone] Fax # [phone] Cell # [phone]  [email or web address]

## 2020-06-26 NOTE — Telephone Encounter (Signed)
Telephone call to patient to schedule palliative care visit with patient. Patient/family in agreement with home visit on 06/27/20 @9 :30am.

## 2020-06-27 ENCOUNTER — Other Ambulatory Visit: Payer: Medicare Other | Admitting: *Deleted

## 2020-06-27 ENCOUNTER — Telehealth: Payer: Self-pay | Admitting: Internal Medicine

## 2020-06-27 ENCOUNTER — Other Ambulatory Visit: Payer: Medicare Other

## 2020-06-27 ENCOUNTER — Other Ambulatory Visit: Payer: Self-pay

## 2020-06-27 ENCOUNTER — Ambulatory Visit: Payer: Self-pay | Admitting: *Deleted

## 2020-06-27 ENCOUNTER — Other Ambulatory Visit: Payer: Self-pay | Admitting: Internal Medicine

## 2020-06-27 VITALS — BP 114/78 | HR 84 | Temp 97.9°F | Resp 17

## 2020-06-27 DIAGNOSIS — E1121 Type 2 diabetes mellitus with diabetic nephropathy: Secondary | ICD-10-CM

## 2020-06-27 DIAGNOSIS — M19012 Primary osteoarthritis, left shoulder: Secondary | ICD-10-CM | POA: Diagnosis not present

## 2020-06-27 DIAGNOSIS — Z515 Encounter for palliative care: Secondary | ICD-10-CM

## 2020-06-27 MED ORDER — OXYCODONE HCL 5 MG PO TABS: 5.0000 mg | ORAL_TABLET | Freq: Two times a day (BID) | ORAL | 0 refills | Status: DC | PRN

## 2020-06-27 MED ORDER — HUMALOG KWIKPEN 200 UNIT/ML ~~LOC~~ SOPN: 3.0000 [IU] | PEN_INJECTOR | Freq: Three times a day (TID) | SUBCUTANEOUS | 1 refills | Status: AC

## 2020-06-27 MED ORDER — TOUJEO SOLOSTAR 300 UNIT/ML ~~LOC~~ SOPN: 15.0000 [IU] | PEN_INJECTOR | Freq: Every morning | SUBCUTANEOUS | 11 refills | Status: AC

## 2020-06-27 NOTE — Telephone Encounter (Signed)
Sent in [place], printed oxycodone as e-scribe is down and this cannot be sent electronically. #60 no refills per prior refill cadence with PCP. Fentanyl cannot be refilled as this has not been previously prescribed by PCP and can be addressed upon PCP return per separate phone note.

## 2020-06-27 NOTE — Telephone Encounter (Signed)
Called husband back he has given her 2 doses of the Humalog (6 units total). He recheck the BS it was at 497.Marland KitchenJohny [name]

## 2020-06-27 NOTE — Telephone Encounter (Signed)
Before I can address I need verification on dosing of coumadin for all days of the week and last dose taken and if any doses are missed or any new medications such as antibiotics recently.

## 2020-06-27 NOTE — Telephone Encounter (Signed)
She has take the Friday dose.Marland KitchenJohny Chess

## 2020-06-27 NOTE — Telephone Encounter (Signed)
Rec'd call from [facility] w/[facility] she states pt BS is 513. Pt just took 3 units of the Humalog around 11:00. Ask nurse was pt taking the Toujeo per husband she is not taking anything but the Humalog. She states pt was d/c on 11/30 w/ palliative care order and sh was there during a assessment. Needing to know what to do concerning elevated BS.Marland Kitchenlmb

## 2020-06-27 NOTE — Telephone Encounter (Signed)
Notified pt husband w/ sliding scales on the Humalog. He states she will need the Toujeo. Also a refill her Oxycodone. He states his wife can not walk and he is trying his best to take care of her. [facility]

## 2020-06-27 NOTE — Telephone Encounter (Signed)
  Monisha from [employer] [facility] [employer] calling to report elevated blood sugar Blood sugar today 513 Call transferred to [place] also reports the spouse states he can no longer take care of patient, requesting assistance with placement

## 2020-06-27 NOTE — Telephone Encounter (Signed)
No per husband they do not have the [employer]

## 2020-06-27 NOTE — Telephone Encounter (Signed)
    Spouse states patient has no pain medication. Requesting fentaNYL (DURAGESIC) 25 MCG/HR and oxyCODONE (OXY IR/ROXICODONE) 5 MG immediate release tablet [id number] [place] [place]

## 2020-06-27 NOTE — Telephone Encounter (Signed)
Forwarding to cindy w/coumdain clinic since MD is out of the office.Marland KitchenJohny Chess

## 2020-06-27 NOTE — Telephone Encounter (Signed)
Per controlled substance database, last RF: Oxy 06/17/20 by Dr Grandville Silos; Fentanyl 06/17/20 by Dr Lanier Clam; before that was 04/22/20 by Dr Camila Li Last OV: 04/22/20 Next OV: 07/23/20  Husband called very upset, states his wife is in a great deal of pain.

## 2020-06-27 NOTE — Telephone Encounter (Signed)
Monisha w/ [facility] called and said she gave the patient 15 units of Toujeo before she left the patients home.

## 2020-06-27 NOTE — Telephone Encounter (Signed)
Per husband she take 5 mg on Monday & Thursday . Tues, Wed, Fri, Sat and Sun 1.5 (7mg ).

## 2020-06-27 NOTE — Telephone Encounter (Signed)
Do we know if she has toujeo at home? It looks like this was prescribed on D/C from hospital recently. Also regarding placement if urgently unable to care for her safely at home needs to go to ER. If not urgent can wait until PCP return.

## 2020-06-27 NOTE — Telephone Encounter (Signed)
Use Insulin (Humalog) sliding scale for additional Humalog:  Sugar <150 - no additional units Sugar <151-200 - 2 additional units Sugar <200-250 - 6 additional units Sugar <251-300 - 8 additional units Sugar <351-350 - 10 additional units   Do we need to email a new prescription for Toujeo? Thx

## 2020-06-27 NOTE — Telephone Encounter (Signed)
Dr Sharlet Salina able to do paper prescription for oxycodone.  Not able to refill fentanyl patch b/c it is a new prescription that Dr Alain Marion will have to determine need for.  Pt husband notified & states he will come to office now to pick up paper prescription.

## 2020-06-27 NOTE — Telephone Encounter (Signed)
Spoke w/jusband gave him MD response.Marland KitchenJohny [name]

## 2020-06-27 NOTE — Telephone Encounter (Signed)
I apologize for the confusion but it appears her heart doctor's office is dosing and monitoring Pt/INT and did address this 1.6 already on Tuesday at a visit for Pt/INR. Confirm with family that this is true and continue with their plan from them and they recommended follow up PT/INR in 2 weeks from 06/24/20.

## 2020-06-27 NOTE — Telephone Encounter (Signed)
Darlina Guys with [facility] called and wanted to report the patients INR 1.6 and pt 18.3

## 2020-06-30 ENCOUNTER — Other Ambulatory Visit: Payer: Self-pay

## 2020-06-30 ENCOUNTER — Telehealth (INDEPENDENT_AMBULATORY_CARE_PROVIDER_SITE_OTHER): Payer: Medicare Other | Admitting: Internal Medicine

## 2020-06-30 DIAGNOSIS — R627 Adult failure to thrive: Secondary | ICD-10-CM | POA: Insufficient documentation

## 2020-06-30 DIAGNOSIS — Z8673 Personal history of transient ischemic attack (TIA), and cerebral infarction without residual deficits: Secondary | ICD-10-CM | POA: Diagnosis not present

## 2020-06-30 DIAGNOSIS — I5033 Acute on chronic diastolic (congestive) heart failure: Secondary | ICD-10-CM | POA: Diagnosis not present

## 2020-06-30 DIAGNOSIS — N184 Chronic kidney disease, stage 4 (severe): Secondary | ICD-10-CM | POA: Diagnosis not present

## 2020-06-30 DIAGNOSIS — J9611 Chronic respiratory failure with hypoxia: Secondary | ICD-10-CM | POA: Diagnosis not present

## 2020-06-30 DIAGNOSIS — E1122 Type 2 diabetes mellitus with diabetic chronic kidney disease: Secondary | ICD-10-CM | POA: Diagnosis not present

## 2020-06-30 DIAGNOSIS — Z794 Long term (current) use of insulin: Secondary | ICD-10-CM

## 2020-06-30 DIAGNOSIS — I2510 Atherosclerotic heart disease of native coronary artery without angina pectoris: Secondary | ICD-10-CM | POA: Diagnosis not present

## 2020-06-30 MED ORDER — FENTANYL 25 MCG/HR TD PT72: 1.0000 | MEDICATED_PATCH | TRANSDERMAL | 0 refills | Status: AC

## 2020-06-30 MED ORDER — FENTANYL 25 MCG/HR TD PT72: 1.0000 | MEDICATED_PATCH | TRANSDERMAL | 0 refills | Status: DC

## 2020-06-30 MED ORDER — NALOXONE HCL 4 MG/0.1ML NA LIQD: NASAL | 2 refills | Status: AC

## 2020-06-30 MED ORDER — OXYCODONE HCL 5 MG PO TABS: 5.0000 mg | ORAL_TABLET | Freq: Two times a day (BID) | ORAL | 0 refills | Status: AC | PRN

## 2020-06-30 NOTE — Assessment & Plan Note (Signed)
Bed ridden Hospice consult

## 2020-06-30 NOTE — Assessment & Plan Note (Signed)
Worse Toujeo plus Humalog Insulin SS

## 2020-06-30 NOTE — Progress Notes (Signed)
COMMUNITY PALLIATIVE CARE SW NOTE  PATIENT NAME: [name] DOB: 07-03-1946 MRN: [id number]  PRIMARY CARE PROVIDER: [name], MD  RESPONSIBLE PARTY:  Acct ID - Guarantor Home Phone Work Phone Relationship Acct Type  [id number] [name], [id number] [phone]  Self P/F     [address], [place], [place] [address]     PLAN OF CARE and INTERVENTIONS:             1. GOALS OF CARE/ ADVANCE CARE PLANNING: Goal is for patient to be placed. Advance care planning is ongoing. 2. SOCIAL/EMOTIONAL/SPIRITUAL ASSESSMENT/ INTERVENTIONS:  SW and RN- [name] completed an initial palliative care visit with patient at her home. Her husband-[name] and later two extended family members joined the visit. [name] advised that patient has a recent 14 day hospital stay due to patient becoming septic. Patient was recommended to go to rehab, however her husband decided to bring her home instead. Since arriving home, patient has had increased pain in her legs, PCG fell trying to get patient up, her days/nights are opposite and PCG is not able to get any sleep, patient is occasionally incontinent of B/B and her appetite is poor where she is needs coaching and encouragement to eat. Patient is on 3 L of continuous o2. Patient is out of her pain medication. Patient is more care than the PCG anticipated, and he is now desiring hospice care or LTC placement for patient. Patient was in bed, appearing to be restless at times, but denying pain. SOCIAL HISTORY: Patient was born in [place], [place]. She completed graduated from high school. Patient worked in Personal assistant. Patient has been married to her husband for 52 years. Patient is a Engineer, manufacturing. SW provided supportive presence, assessment of needs and comfort of patient, active and reflective listening, education ad reassurance of support. SW will provide ongoing assessment of psychosocial needs of patient and provide support to patient's PCG. 3. PATIENT/CAREGIVER  EDUCATION/ COPING:  Patient appear to be alert and oriented to self only. Her husband is overwhelmed with patient's care. His support network is limited to a few friends and family. 4. PERSONAL EMERGENCY PLAN:  911 can be activated for emergencies. 5. COMMUNITY RESOURCES COORDINATION/ HEALTH CARE NAVIGATION:  Patient has been recommended for therapy.  6. FINANCIAL/LEGAL CONCERNS/INTERVENTIONS: PCG is concern about the cost of care (LTC or private pay in-home).     SOCIAL HX:  Social History   Tobacco Use  . Smoking status: Former Smoker    Packs/day: 1.00    Years: 30.00    Pack years: 30.00    Types: Cigarettes    Quit date: 07/19/2001    Years since quitting: 18.9  . Smokeless tobacco: Never Used  Substance Use Topics  . Alcohol use: No    CODE STATUS: DNR ADVANCED DIRECTIVES: Yes MOST FORM COMPLETE:  No HOSPICE EDUCATION PROVIDED: No  PPS: Patient is alert and oriented to self only.  Patient is dependent for personal care needs. She is non-ambulatory, at-risk for falls.  Duration of visit and documentation: 75 minutes    [name] Puller, LCSW

## 2020-06-30 NOTE — Telephone Encounter (Signed)
Pt's husband & SIL called office inquiring about the prescriptions.  Notified that I'm still awaiting PCP response

## 2020-06-30 NOTE — Assessment & Plan Note (Signed)
Discussed [facility] ref Poss [facility]

## 2020-06-30 NOTE — Assessment & Plan Note (Signed)
Per Palliative Care note on 12/10 ([place], SW):  "[name] advised that patient has a recent 14 day hospital stay due to patient becoming septic. Patient was recommended to go to rehab, however her husband decided to bring her home instead. Since arriving home, patient has had increased pain in her legs, PCG fell trying to get patient up, her days/nights are opposite and PCG is not able to get any sleep, patient is occasionally incontinent of B/B and her appetite is poor where she is needs coaching and encouragement to eat. Patient is on 3 L of continuous o2. Patient is out of her pain medication. Patient is more care than the PCG anticipated and he is now desiring hospice care or LTC placement. "  Hospice ref

## 2020-06-30 NOTE — Progress Notes (Signed)
Virtual Visit via Video Note  I connected with Adeleine Pask on 06/30/20 at  2:20 PM EST by a video enabled telemedicine application and verified that I am speaking with the correct person using two identifiers.   I discussed the limitations of evaluation and management by telemedicine and the availability of in person appointments. The patient expressed understanding and agreed to proceed.  I was located at our [facility] office. The patient was at home. There was Mr Santiago and his sister present in the visit.  1. Per Palliative Care note on 12/10:  "[name] advised that patient has a recent 14 day hospital stay due to patient becoming septic. Patient was recommended to go to rehab, however her husband decided to bring her home instead. Since arriving home, patient has had increased pain in her legs, PCG fell trying to get patient up, her days/nights are opposite and PCG is not able to get any sleep, patient is occasionally incontinent of B/B and her appetite is poor where she is needs coaching and encouragement to eat. Patient is on 3 L of continuous o2. Patient is out of her pain medication. Patient is more care than the PCG anticipated and he is now desiring hospice care or LTC placement. "    History of Present Illness: C/o severe pain - out of all pain meds - upset. We faxed Oxy Rx on 12/10, but the pharmacy did not have it... They are using [facility]   Per d/c (hosp stay reviewed - sepsis, cellulitis, PAD, A fib, DM):  "Admit date: 06/03/2020 Discharge date: 06/16/2020  Time spent: 60 minutes  Recommendations for Outpatient Follow-up:  1. Follow-up with Gladie Gravette, [name], MD in 1 to 2 weeks.  On follow-up patient will need the diabetes reassessed as patient's Toujeo dose was decreased to 15 units daily.  Patient also need a basic metabolic profile done to follow-up on electrolytes and renal function. 2. Follow-up with Dr. [name], cardiology in 2 weeks for follow-up  on cardiac issues. 3. Follow-up at Coumadin clinic on Wednesday June 18, 2020 for INR check.  Once INR is deemed therapeutic patient will need to be advised when Lovenox bridge can be discontinued. 4. Follow-up with Dr. [name], vascular surgery in 1 to 2 weeks for follow-up on lower extremity arterial ulcers/wounds.   Discharge Diagnoses:  Principal Problem:   Sepsis ([place]) Active Problems:   Type 2 diabetes mellitus with chronic kidney disease, with long-term current use of insulin (HCC)   Hyperlipidemia LDL goal <70   Renal transplant recipient   S/P aortic valve replacement with bioprosthetic valve and maze procedure   Acute on chronic diastolic CHF (congestive heart failure) (HCC)   History of stroke   Venous stasis ulcers of both lower extremities ([place])   ESRD (end stage renal disease) (HCC)   Atrial fibrillation with rapid ventricular response ([place])   Pulmonary hypertension, unspecified ([place])   Palliative care by specialist   Goals of care, counseling/discussion   General weakness   Cellulitis of both lower extremities   Ulcer of left lower extremity with fat layer exposed ([place])   Urinary tract infection without hematuria   Discharge Condition: Stable and improved  Diet recommendation: Regular       Filed Weights   06/13/20 0403 06/15/20 0500 06/16/20 0500  Weight: 78.8 kg 81.3 kg 82.6 kg    History of present illness:  HPI per Dr. 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   Apparently, after her procedure 4 days ago, she was not felt to be a candidate for percutaneous intervention because of flush SFA occlusion on both sides and only would be a candidate for local woundunlessthey did notheal then she may need amputations.   Currently she is awakeand initiallyseemed confused due to her difficulty speaking. Husband was unable to be contacted by phone to question baseline. Stat head CT was ordered. After further discussion with the patient she admitted she has chronic difficulties with speech  ED Course:Febrile, tachycardic, tachypneic, hypoxic (SpO2 84%)placed on 2 LPM. Notable Labs:Glucose 239, BUN 61, creatinine 2.92, calcium 10.2, alk phos 136, albumin 2.8, lactic acid 1.7, WBC 14, Hb 12.5, INR 1.3, BNP 1883 (chronically elevated at about baseline), troponin 161. VBG: pH 7.39, PCO2 54, PO2 26.8, bicarb 32. Noted to be in atrial fibrillation with RVR. Notable Imaging:CXR concerning for CHF pattern.Started on sepsis protocol and initial LR boluses were discontinued prior to giving patient. She was given LR maintenance fluid and receivedLasix 80 mg IV x1, started on Cardizem drip, Tylenol, vancomycin and Zosyn.  Hospital Course:  1 sepsis secondary to left lower extremity purulent cellulitis/infectednonhealing wounds due to PAD and possibleProteus UTI, POA Patient pancultured. Blood cultures with no growth to date. Urine cultures with 10,000 colonies of Proteus Mirabilis. 2D echo which was done did not show a large vegetation. Patient noted to have had an abdominal aortogram on 05/30/2020 that showed heavily calcified bilateral proximal arteries with flush occlusion of bilateral superficial  femoral artery. CT left lower extremity with open ulceration with cellulitis and myofascitis. -Per vascular surgery amputation will be next consideration if pain was unbearable. Palliative care following and helping manage patient's current pain as well as goals of care. -Was on vancomycin and meropenem and transitioned to Zyvox. IV Zyvox transitioned to oral Zyvox and patient be discharged home on 3 more days of oral Zyvox to complete a 14-day course of antibiotic treatment.   -Norvasc was discontinued secondary to edema.   -Outpatient follow-up with Dr. [name], vascular surgery in 1 to 2 weeks for further evaluation and recommendations.    2. Acute on chronic hypoxemic respiratory failure secondary to acuteon chronic diastolic CHF exacerbation 2D echo done with EF of 55 to 60%, right wall motion abnormalities, RVSP 73.4, severe LAE, moderate RAE, moderate to severe MVR, severe mitral annular calcification and severe TR. Patient was on IV Lasix with good diuresis and subsequently transition back to home dose oral torsemide which she tolerated.  Patient was followed by cardiology throughout the hospitalization.  Outpatient follow-up with primary cardiologist.    3. Moderate to severe mitral valve regurgitation/severe tricuspidvalvular regurgitation Per cardiology not a candidate for surgical intervention at this time.   Patient maintained on diuretics and cardiac medications.  Outpatient follow-up with cardiology.    4. History of severe AI status post AVR with prosthetic valve Stable. Per cardiology.  5. A. fib with  RVR Rate controlled on Lopressor. Patient initially during the hospitalization had been refusing oral medications however as patient improved clinically was compliant with oral medications and IV Lopressor was subsequently transition back to home regimen of oral Lopressor 25 mg twice daily.  Patient initially was on IV heparin and subsequently transition to Lovenox  bridge with Coumadin (06/12/2020 ).  INR was 1.7 on day of discharge.  Patient be discharged home with a Lovenox bridge with Coumadin and will need to follow-up at the Coumadin clinic for INR check on Wednesday June 18, 2020.    6. History of coronary artery disease status post stents Patient maintained on beta-blocker, anticoagulation.  Patient was followed by cardiology during the hospitalization.  Outpatient follow-up with primary cardiologist.    7. History of embolic CVA with residual expressive aphasia CT head done during this hospitalization showed prior infarct at the junction of the left temporoparietal and occipital lobes. No acute infarct noted. Patient being followed by speech therapy.   Patient was maintained on home regimen of beta-blocker, on Lovenox bridge with Coumadin.  INR was subtherapeutic however was 1.7 by day of discharge.  Patient be discharged home and on Lovenox bridge with Coumadin and will need to follow-up at the Coumadin clinic for INR check on Wednesday June 18, 2020.  8. Abdominal pain CT abdomen and pelvis without contrast revealed new body wall edema. Recent arteriogram does not look like ischemic bowel.  Improved clinically.  9. History of end-stage renal disease status post renal transplantnow with chronic kidney disease stage IV Stable.   Patient maintained on home regimen of low-dose prednisone and tacrolimus.  Diet was liberalized due to poor oral intake.  Outpatient follow-up with nephrology as previously scheduled.   10. Uncontrolled type 2 diabetes mellitus Hemoglobin A1c 9.2 (06/03/2020 ).  Patient was started on Lantus and uptitrate to 14 units daily in addition to new coverage NovoLog 2 units 3 times a day as well as sliding scale insulin with good blood glucose coverage.  Patient will be discharged home on Toujeo at a decreased dose of 15 units daily, meal coverage NovoLog 3 units with meals.  Outpatient follow-up with PCP.  11.  Hypertension Controlled on home regimen of metoprolol and torsemide.  Outpatient follow-up.    12. Left upper extremity swelling Left upper extremity Dopplers negative for DVT. Outpatient follow-up.  42. Hyperlipidemia Patient statin was held during the hospitalization will be resumed on discharge.   14. Goals of care Patient with multiple comorbidities. Patient with a poor prognosis. Patient full code. Palliative care following and discussing with family.Patient will be discharged home with palliative care following in the outpatient setting.    15. Inadequate oral intake Patient statedf improvement with oral intake.  Patient diet was liberalized for better oral intake.  Outpatient follow-up."     Observations/Objective: In bed  Assessment and Plan:  See my Assessment and Plan.     A total time of >45 minutes was spent preparing to see the patient, reviewing tests, x-rays, operative reports and outside records.  Also, obtaining history and performing comprehensive physical exam.  Additionally, counseling the patient regarding the above listed issues.   Finally, documenting clinical information in the health records, coordination of care, educating the patient's family re: Hospice care, pain meds use, Naloxone Rx. It is a complex case.   Follow Up Instructions:    I discussed the assessment and treatment plan with the patient. The patient was provided an opportunity to ask questions and  all were answered. The patient agreed with the plan and demonstrated an understanding of the instructions.   The patient was advised to call back or seek an in-person evaluation if the symptoms worsen or if the condition fails to improve as anticipated.  I provided face-to-face time during this encounter. We were at different locations.   Walker Kehr, MD

## 2020-06-30 NOTE — Patient Outreach (Signed)
[facility]) Care Management  06/30/2020  [name] 1945-09-10 [id number]   Telephone assessment:  Placed call to patient. Patient asleep and husband not home. Spoke with sister in Sports coach. ( previous permission granted).  Sister in law confirms difficulty in managing patient at home. Reports palliative care saw patient last week. Reports that husband is not able to take care of patient at home.   Sister in law reports patients appetite is improved. Reports patient is having foot pain and is out of pain medications.  Reports patient is getting all medications as prescribed. Reviewed concern about high CBG last week and not getting her Toujeo.   Reviewed with sister in law that medications were reviewed with Mr. Zackery after discharge and confirmed patient had all medications and Mr. Stare knew how to give. Sister in law reports patient is now getting insulin as directed. Todays CBG of 174.   Sister in law confirmed virtual video appointment with primary MD today at 2:20 to discuss plan of care.   [facility] social worker has spoken with husband and is willing to assist.  PLAN: will call back tomorrow to review plan and update.  Tomasa Rand, RN, BSN, CEN [facility] [employer] [phone]

## 2020-06-30 NOTE — Telephone Encounter (Signed)
Pt husband never came to pick up printed prescription of Oxycodone on Fri (RN waited until 5:30pm).  Husband states he did not remember the phone call of him stating that he would come to the office for paper prescription. Husband notified that PCP has refilled fentanyl patch & that insulin pen is at pharmacy for pick up.  Pharmacy verified as pharmacy on file.  Will determine need for paper prescription vs e filing & call husband back.

## 2020-07-01 ENCOUNTER — Encounter: Payer: Self-pay | Admitting: *Deleted

## 2020-07-01 ENCOUNTER — Telehealth: Payer: Self-pay | Admitting: Internal Medicine

## 2020-07-01 ENCOUNTER — Other Ambulatory Visit: Payer: Self-pay

## 2020-07-01 ENCOUNTER — Other Ambulatory Visit: Payer: Self-pay | Admitting: *Deleted

## 2020-07-01 DIAGNOSIS — I130 Hypertensive heart and chronic kidney disease with heart failure and stage 1 through stage 4 chronic kidney disease, or unspecified chronic kidney disease: Secondary | ICD-10-CM | POA: Diagnosis not present

## 2020-07-01 DIAGNOSIS — S20223D Contusion of bilateral back wall of thorax, subsequent encounter: Secondary | ICD-10-CM | POA: Diagnosis not present

## 2020-07-01 DIAGNOSIS — J9600 Acute respiratory failure, unspecified whether with hypoxia or hypercapnia: Secondary | ICD-10-CM | POA: Diagnosis not present

## 2020-07-01 DIAGNOSIS — I5030 Unspecified diastolic (congestive) heart failure: Secondary | ICD-10-CM | POA: Diagnosis not present

## 2020-07-01 DIAGNOSIS — I083 Combined rheumatic disorders of mitral, aortic and tricuspid valves: Secondary | ICD-10-CM | POA: Diagnosis not present

## 2020-07-01 DIAGNOSIS — Z940 Kidney transplant status: Secondary | ICD-10-CM | POA: Diagnosis not present

## 2020-07-01 DIAGNOSIS — E1151 Type 2 diabetes mellitus with diabetic peripheral angiopathy without gangrene: Secondary | ICD-10-CM | POA: Diagnosis not present

## 2020-07-01 DIAGNOSIS — I70248 Atherosclerosis of native arteries of left leg with ulceration of other part of lower left leg: Secondary | ICD-10-CM | POA: Diagnosis not present

## 2020-07-01 DIAGNOSIS — N184 Chronic kidney disease, stage 4 (severe): Secondary | ICD-10-CM | POA: Diagnosis not present

## 2020-07-01 DIAGNOSIS — R4182 Altered mental status, unspecified: Secondary | ICD-10-CM | POA: Diagnosis not present

## 2020-07-01 DIAGNOSIS — H04123 Dry eye syndrome of bilateral lacrimal glands: Secondary | ICD-10-CM | POA: Diagnosis not present

## 2020-07-01 DIAGNOSIS — I679 Cerebrovascular disease, unspecified: Secondary | ICD-10-CM | POA: Diagnosis not present

## 2020-07-01 DIAGNOSIS — I4891 Unspecified atrial fibrillation: Secondary | ICD-10-CM | POA: Diagnosis not present

## 2020-07-01 DIAGNOSIS — E1122 Type 2 diabetes mellitus with diabetic chronic kidney disease: Secondary | ICD-10-CM | POA: Diagnosis not present

## 2020-07-01 DIAGNOSIS — I2510 Atherosclerotic heart disease of native coronary artery without angina pectoris: Secondary | ICD-10-CM | POA: Diagnosis not present

## 2020-07-01 DIAGNOSIS — I70238 Atherosclerosis of native arteries of right leg with ulceration of other part of lower right leg: Secondary | ICD-10-CM | POA: Diagnosis not present

## 2020-07-01 DIAGNOSIS — E039 Hypothyroidism, unspecified: Secondary | ICD-10-CM | POA: Diagnosis not present

## 2020-07-01 NOTE — Patient Outreach (Signed)
[facility]) Care Management  07/01/2020  Sequoyah Ramone 04-16-1946 [id number]    CSW was scheduled to outreach to patient's husband, Leanah Kolander again tomorrow (Wednesday, July 02, 2020, around 11:00AM); however, CSW received an In [employer] from [employer], also with [facility]) Care Management, Rowe Pavy indicating that she spoke with Mr. Mousseau earlier in the day and that he admitted to being unable to adequately care for patient in the home.  Ms. Adriana Simas further reported that Mr. Molyneux is now wanting to pursue placement for patient into a long-term care skilled nursing facility, but would like to meet with the representatives from [employer] first, before making that decision.  After thorough review of patient's electronic medical record in Epic, CSW noted that patient, Mr. Moilanen, and two extended family members met with Candiss Norse, Nurse and Clydia Llano, Licensed Clinical Social Worker, both with [employer], today at noon to discuss goals of care.  In terms of advance care planning, Mr. Douglass reported to [place] and Maxine Glenn that he would either like for patient to be placed into a long-term care facility, or receive Hospice services in the home, no longer wanting to be responsible for patient's 24 hour care and supervision.  During this initial home visit with [employer], it was decided that patient would be admitted to Hospice services, obtaining orders from patient's Primary Care Physician, Dr. Jacinta Shoe, and changing her code status to Do Not Resuscitate.  CSW was able to make contact with Mr. [name] today to confirm all of the above information, as well as to perform the initial assessment on patient.  CSW introduced self, explained role and types of services provided through [employer] Care Management [name] Care Management).  CSW further explained to Mr. Napoleon that CSW works with  patient's RNCM, also with [facility] Care Management, Rowe Pavy.  CSW then explained to Mr. Vahey the reason for the call, wanting to follow-up with him regarding our conversation from last week.   Mr. Nouri remembered having spoken with CSW, but was somewhat confused as to why patient has been assigned to 3 different social workers.  After inquiring, Mr. Collison reported that he has spoken with a home health social worker through [facility], the social worker through [employer] today, and now CSW this evening.  CSW explained the reason for the call, indicating that Ms. Adriana Simas thought that he would benefit from social work services and resources to assist with possible long-term care placement arrangements for patient.  Mr. Caraway then indicated that he is already receiving placement assistance from [facility] with [employer], and that he will also be cancelling social work services through [employer].  Mr. Wrightson politely declined CSW's offer to provide social work services at this time, but was agreeable to having CSW follow-up with him again in two weeks, on Tuesday, July 15, 2020, around 10:00AM.  Mr. Mooradian reported that patient's Advanced Directives (Healthcare Power of Attorney documents) are now in place and patient is a DNR.  Mr. Blacketer denies the need for transportation assistance, meal preparation, financial assistance, or pharmacy assistance, at present.  CSW explained to Mr. Grumbine that, if he answered correctly to all of the questions on the PHQ-2 Depression Screening and PHQ-9 Depression Screening, then CSW is concerned about patient's level of stress, anxiety and depression, offering to provide counseling and supportive services.  Mr. Swango declined the offer, stating that patient would refuse any type of psychiatric  or mental health treatment.  Mr. Hamme denied patient expressing thoughts of wanting to harm herself or others, indicating that she takes her  medications exactly as prescribed.  CSW was able to confirm that Mr. Hornsby has the correct contact information for CSW, encouraging him to contact CSW directly if he changes his mind about wanting to receive services, or if additional social work needs arise between now and the time of CSW's follow-up call.  Mr. Leyland voiced understanding and was agreeable to this plan.  CSW will follow-up with Ms. Cook to report findings of CSW's conversation with Mr. Watson today, as well as route this note to her and Dr. Alain Marion.  Nat Christen, BSW, MSW, LCSW  Licensed Education officer, environmental Health System  Mailing [facility] [place] [address], [place], [place] [address] Physical Address-[address], [place], [place] [address] Toll Free Main # [phone] Fax # [phone] Cell # [phone]  [email or web address]

## 2020-07-01 NOTE — Telephone Encounter (Signed)
Yes, if Dr. Lyman Speller would like to take over doing the prescriptions. Do I need to resend fentanyl prescription order will Dr. Lyman Speller do it?  Thanks

## 2020-07-01 NOTE — Telephone Encounter (Signed)
Addressed.

## 2020-07-01 NOTE — Telephone Encounter (Signed)
Patients SIL called and said that the original pharmacy does not have fentaNYL ([place]) 25 MCG/HR  She was [facility]  [address] [place], [place] [address].

## 2020-07-01 NOTE — Patient Outreach (Signed)
[facility]) Care Management  07/01/2020  Chevon Fomby Dec 16, 1945 [id number]   Telephone assessment:  Placed call to patient an was informed patient in pain and unable to talk on the phone.  I spoke with husband, [name].  States video visit with MD yesterday went well.  Husband reports he is not able to take care of patient at home. Reports hospice is coming at 12:00.  Husband reports he wants patient to go to a nursing facility.    Husband reports he has the pain medication but not the patch. Waiting for patch to be ready for pick up. Reports pain in legs and feet is patients normal pain.  Reports CBG today of 206.  Reports he has having patient her medications.    Reviewed plan of care and he states he is waiting for hospice and then wants to pursue placement.   PLAN: will send in basket message to [facility] social worker.  Tomasa Rand, RN, BSN, CEN [facility] [employer] [phone]

## 2020-07-01 NOTE — Progress Notes (Signed)
COMMUNITY PALLIATIVE CARE RN NOTE  PATIENT NAME: [name] DOB: 06/07/1946 MRN: [id number]  PRIMARY CARE PROVIDER: [name], MD  RESPONSIBLE PARTY: Aura Fey (spouse) Acct ID - Guarantor Home Phone Work Phone Relationship Acct Type  [id number] ALEYNAH, [id number]  Self P/F     [address], [place], [place] [id number]   Covid-19 Pre-screening Negative  PLAN OF CARE and INTERVENTION:  1. ADVANCE CARE PLANNING/GOALS OF CARE: Husband is overwhelmed and feels it is too difficult to care for patient at home. He requested nursing home placement. 2. PATIENT/CAREGIVER EDUCATION: Explained palliative care services, hospice education(eligibility/requirements), fall prevention, s/s of infection 3. DISEASE STATUS: Joint visit made with Palliative care SW, Monica Lonon. Met with patient and her husband in their home. Other family members arrived during visit. Husband provided patient health history. She was recently hospitalized for 14 days. Husband says he called EMS after patient was having a continuous nose bleed and is on Coumadin. While she was in the hospital they found infection in her blood from left lower extremity cellulitis from wounds and UTI. The hospital recommended SNF placement for rehab, but husband opted to bring patient back home because that's what she wanted to do. He is now feeling that he is unable to care for her. He is elderly with his own health issues. Patient is sitting up on the side of the bed with her head down. Confused speech and difficult to understand at times. Upon assessment, she denies pain in her legs from her wounds, but does have pain from a large lump found on her back by her shoulder blade. Bruising also noted over lump, along with some bruising on her lower back and left buttocks. She is becoming progressively weaker. She is unable to stand or transfer. [employer] came during visit and took a x-ray of patient's shoulder. She is  non-ambulatory. She had a fall when trying to get out of bed last night. Husband strained his back and fell himself when trying to get her up off of the floor. She has occasional incontinence of both bowel and bladder. Husband changes her while she is in bed. Her appetite is poor. She is only taking 2-3 bites at meal times. She drinks 1-2 Ensure's daily for nutritional supplementation. No dysphagia and takes her medications without difficulties. Husband requested that I check her blood sugar today. It is 513. Patient seems sleepy, but aroused easily with verbal stimulation. Gave patient Humalog 3 units as ordered and contacted Dr. Judeen Hammans office and spoke with Lorre Nick, [place]. He is out of the office today. Advised of patient's blood sugar and amount of Humalog administered. She noticed from medication list that patient is also supposed to be taking Toujeo. Husband initially said that he does not have this medication, but I was able to find it and gave patient the 15 units of it as prescribed. I also informed the office that her x-ray was just completed, that husband is requesting assistance with placing patient in a facility and that she is currently out of her Oxycodone and Fentanyl patches. Lucy to give this information to another provider and call husband back for further instructions.  Husband is agreeable to future visits from palliative care. Will continue to monitor.   HISTORY OF PRESENT ILLNESS: This is a 74 yo female with a history of severe aortic stenosis s/p AVR, HTN, HLD, CAD s/p stent, Afib (on coumadin), lung cancer, CKD stage 4, CVA and non healing wounds. Palliative care team has been  asked to follow patient for additional support. Will visit patient monthly and PRN.  CODE STATUS: Full code ADVANCED DIRECTIVES: Y MOST FORM: no PPS: 30%   PHYSICAL EXAM:   VITALS: T: 97.9, P 84, BP 114/78, R 17, O2 94% on 3L via Chloride LUNGS: clear to auscultation  CARDIAC: Cor RRR EXTREMITIES: Trace lower  extremity edema SKIN: Bruising to posterior left shoulder, lower back and left buttocks, wounds on bilateral legs covered with foam dressings  NEURO: Alert and oriented to person/place, intermittent confusion, increased generalized weakness, non-ambulatory   (Duration of visit and documentation 135 minutes)   Daryl Eastern, RN BSN

## 2020-07-01 NOTE — Telephone Encounter (Signed)
12/9 appointment with SW missed

## 2020-07-01 NOTE — Telephone Encounter (Signed)
Almyra Free from [facility] called   The patient has been admitted to hospice today. Dr. Lyman Speller would like to know if Dr. Alain Marion would like him to take over the administration the patient's prescriptions while in hospice care.   The prescription called in for the  fentaNYL ([place]) 25 MCG/HR was out of at the pharmacy on file. Are you able to resend the prescription to the pharmacy listed below?   [id number] [address] [place], [place] [address] Phone:  [phone]  Please give her a call back at [phone]

## 2020-07-01 NOTE — Telephone Encounter (Signed)
Addressed on Monday.  Apparently the fax did not go through.  Thanks

## 2020-07-02 ENCOUNTER — Ambulatory Visit: Payer: Self-pay | Admitting: *Deleted

## 2020-07-02 ENCOUNTER — Other Ambulatory Visit: Payer: Self-pay | Admitting: *Deleted

## 2020-07-02 ENCOUNTER — Encounter: Payer: Self-pay | Admitting: *Deleted

## 2020-07-02 ENCOUNTER — Other Ambulatory Visit: Payer: Self-pay

## 2020-07-02 DIAGNOSIS — L97212 Non-pressure chronic ulcer of right calf with fat layer exposed: Secondary | ICD-10-CM

## 2020-07-02 DIAGNOSIS — J9600 Acute respiratory failure, unspecified whether with hypoxia or hypercapnia: Secondary | ICD-10-CM | POA: Diagnosis not present

## 2020-07-02 DIAGNOSIS — Z87440 Personal history of urinary (tract) infections: Secondary | ICD-10-CM

## 2020-07-02 DIAGNOSIS — I878 Other specified disorders of veins: Secondary | ICD-10-CM

## 2020-07-02 DIAGNOSIS — I130 Hypertensive heart and chronic kidney disease with heart failure and stage 1 through stage 4 chronic kidney disease, or unspecified chronic kidney disease: Secondary | ICD-10-CM | POA: Diagnosis not present

## 2020-07-02 DIAGNOSIS — E1122 Type 2 diabetes mellitus with diabetic chronic kidney disease: Secondary | ICD-10-CM | POA: Diagnosis not present

## 2020-07-02 DIAGNOSIS — I5033 Acute on chronic diastolic (congestive) heart failure: Secondary | ICD-10-CM

## 2020-07-02 DIAGNOSIS — N184 Chronic kidney disease, stage 4 (severe): Secondary | ICD-10-CM | POA: Diagnosis not present

## 2020-07-02 DIAGNOSIS — I083 Combined rheumatic disorders of mitral, aortic and tricuspid valves: Secondary | ICD-10-CM | POA: Diagnosis not present

## 2020-07-02 DIAGNOSIS — I482 Chronic atrial fibrillation, unspecified: Secondary | ICD-10-CM

## 2020-07-02 DIAGNOSIS — Z955 Presence of coronary angioplasty implant and graft: Secondary | ICD-10-CM

## 2020-07-02 DIAGNOSIS — I6523 Occlusion and stenosis of bilateral carotid arteries: Secondary | ICD-10-CM

## 2020-07-02 DIAGNOSIS — C3491 Malignant neoplasm of unspecified part of right bronchus or lung: Secondary | ICD-10-CM

## 2020-07-02 DIAGNOSIS — Z940 Kidney transplant status: Secondary | ICD-10-CM

## 2020-07-02 DIAGNOSIS — E1165 Type 2 diabetes mellitus with hyperglycemia: Secondary | ICD-10-CM

## 2020-07-02 DIAGNOSIS — J449 Chronic obstructive pulmonary disease, unspecified: Secondary | ICD-10-CM

## 2020-07-02 DIAGNOSIS — Z952 Presence of prosthetic heart valve: Secondary | ICD-10-CM

## 2020-07-02 DIAGNOSIS — L03116 Cellulitis of left lower limb: Secondary | ICD-10-CM

## 2020-07-02 DIAGNOSIS — Z7901 Long term (current) use of anticoagulants: Secondary | ICD-10-CM

## 2020-07-02 DIAGNOSIS — J9620 Acute and chronic respiratory failure, unspecified whether with hypoxia or hypercapnia: Secondary | ICD-10-CM

## 2020-07-02 DIAGNOSIS — L97222 Non-pressure chronic ulcer of left calf with fat layer exposed: Secondary | ICD-10-CM

## 2020-07-02 DIAGNOSIS — Z79891 Long term (current) use of opiate analgesic: Secondary | ICD-10-CM

## 2020-07-02 DIAGNOSIS — I350 Nonrheumatic aortic (valve) stenosis: Secondary | ICD-10-CM

## 2020-07-02 DIAGNOSIS — I5030 Unspecified diastolic (congestive) heart failure: Secondary | ICD-10-CM | POA: Diagnosis not present

## 2020-07-02 DIAGNOSIS — D509 Iron deficiency anemia, unspecified: Secondary | ICD-10-CM

## 2020-07-02 DIAGNOSIS — I69320 Aphasia following cerebral infarction: Secondary | ICD-10-CM

## 2020-07-02 NOTE — Telephone Encounter (Signed)
Notified Almyra Free w/MD response. She stated that their NP was able to [facility] last night.Marland KitchenJohny Chess

## 2020-07-02 NOTE — Patient Outreach (Signed)
[facility]) Care Management  07/02/2020  [name] 04/16/1946 [id number]   CSW received an In [employer] from Tomasa Rand, patient's RNCM with [facility], indicating that she is closing patient's case due to patient electing Hospice services, encouraging CSW to do the same, now that patient is working with another community case management program.  CSW will perform a case closure on patient, as well as send a Physician Case Closure Letter to patient's Primary Care Physician, Dr. Tyrone Apple Plotnikov.    Nat Christen, BSW, MSW, LCSW  Licensed Education officer, environmental Health System  Mailing [place] [address] [place], [place] [address] Physical Address-[address], [place], [place] [address] Toll Free Main # [phone] Fax # [phone] Cell # [phone]  [email or web address]

## 2020-07-02 NOTE — Patient Outreach (Signed)
[facility]) Care Management  07/02/2020  [name] Apr 08, 1946 [id number]   Case closure for nursing:  Reviewed noted from [facility] social worker. Called husband and confirmed hospice in placed. Husband states hospice nurse came out today and spent 2 hours. Reviewed case closure with husband and he appreciates all previous help.   PLAN: will alert [facility] social worker of nursing case closure.  Tomasa Rand, RN, BSN, CEN [facility] [employer] [phone]

## 2020-07-03 DIAGNOSIS — E1122 Type 2 diabetes mellitus with diabetic chronic kidney disease: Secondary | ICD-10-CM | POA: Diagnosis not present

## 2020-07-03 DIAGNOSIS — I5030 Unspecified diastolic (congestive) heart failure: Secondary | ICD-10-CM | POA: Diagnosis not present

## 2020-07-03 DIAGNOSIS — J9600 Acute respiratory failure, unspecified whether with hypoxia or hypercapnia: Secondary | ICD-10-CM | POA: Diagnosis not present

## 2020-07-03 DIAGNOSIS — N184 Chronic kidney disease, stage 4 (severe): Secondary | ICD-10-CM | POA: Diagnosis not present

## 2020-07-03 DIAGNOSIS — I083 Combined rheumatic disorders of mitral, aortic and tricuspid valves: Secondary | ICD-10-CM | POA: Diagnosis not present

## 2020-07-03 DIAGNOSIS — I130 Hypertensive heart and chronic kidney disease with heart failure and stage 1 through stage 4 chronic kidney disease, or unspecified chronic kidney disease: Secondary | ICD-10-CM | POA: Diagnosis not present

## 2020-07-04 DIAGNOSIS — J9600 Acute respiratory failure, unspecified whether with hypoxia or hypercapnia: Secondary | ICD-10-CM | POA: Diagnosis not present

## 2020-07-04 DIAGNOSIS — I5030 Unspecified diastolic (congestive) heart failure: Secondary | ICD-10-CM | POA: Diagnosis not present

## 2020-07-04 DIAGNOSIS — I130 Hypertensive heart and chronic kidney disease with heart failure and stage 1 through stage 4 chronic kidney disease, or unspecified chronic kidney disease: Secondary | ICD-10-CM | POA: Diagnosis not present

## 2020-07-04 DIAGNOSIS — I083 Combined rheumatic disorders of mitral, aortic and tricuspid valves: Secondary | ICD-10-CM | POA: Diagnosis not present

## 2020-07-04 DIAGNOSIS — E1122 Type 2 diabetes mellitus with diabetic chronic kidney disease: Secondary | ICD-10-CM | POA: Diagnosis not present

## 2020-07-04 DIAGNOSIS — N184 Chronic kidney disease, stage 4 (severe): Secondary | ICD-10-CM | POA: Diagnosis not present

## 2020-07-07 DIAGNOSIS — J9600 Acute respiratory failure, unspecified whether with hypoxia or hypercapnia: Secondary | ICD-10-CM | POA: Diagnosis not present

## 2020-07-07 DIAGNOSIS — E1122 Type 2 diabetes mellitus with diabetic chronic kidney disease: Secondary | ICD-10-CM | POA: Diagnosis not present

## 2020-07-07 DIAGNOSIS — I5030 Unspecified diastolic (congestive) heart failure: Secondary | ICD-10-CM | POA: Diagnosis not present

## 2020-07-07 DIAGNOSIS — I083 Combined rheumatic disorders of mitral, aortic and tricuspid valves: Secondary | ICD-10-CM | POA: Diagnosis not present

## 2020-07-07 DIAGNOSIS — I130 Hypertensive heart and chronic kidney disease with heart failure and stage 1 through stage 4 chronic kidney disease, or unspecified chronic kidney disease: Secondary | ICD-10-CM | POA: Diagnosis not present

## 2020-07-07 DIAGNOSIS — N184 Chronic kidney disease, stage 4 (severe): Secondary | ICD-10-CM | POA: Diagnosis not present

## 2020-07-09 ENCOUNTER — Telehealth: Payer: Self-pay | Admitting: Cardiology

## 2020-07-09 ENCOUNTER — Other Ambulatory Visit: Payer: Self-pay | Admitting: Cardiology

## 2020-07-09 DIAGNOSIS — I5030 Unspecified diastolic (congestive) heart failure: Secondary | ICD-10-CM | POA: Diagnosis not present

## 2020-07-09 DIAGNOSIS — J9600 Acute respiratory failure, unspecified whether with hypoxia or hypercapnia: Secondary | ICD-10-CM | POA: Diagnosis not present

## 2020-07-09 DIAGNOSIS — I083 Combined rheumatic disorders of mitral, aortic and tricuspid valves: Secondary | ICD-10-CM | POA: Diagnosis not present

## 2020-07-09 DIAGNOSIS — I130 Hypertensive heart and chronic kidney disease with heart failure and stage 1 through stage 4 chronic kidney disease, or unspecified chronic kidney disease: Secondary | ICD-10-CM | POA: Diagnosis not present

## 2020-07-09 DIAGNOSIS — E1122 Type 2 diabetes mellitus with diabetic chronic kidney disease: Secondary | ICD-10-CM | POA: Diagnosis not present

## 2020-07-09 DIAGNOSIS — N184 Chronic kidney disease, stage 4 (severe): Secondary | ICD-10-CM | POA: Diagnosis not present

## 2020-07-09 NOTE — Telephone Encounter (Signed)
Called and instructed nurse to draw inr asap when they go to see the pt next and she stated that it would be Monday

## 2020-07-09 NOTE — Telephone Encounter (Signed)
Arrie Eastern, Hospice RN with [facility] was calling to see when the patient's INR iwould need to be checked. The Hospice RN states the patient is still taking the coumadin but is new to her service. Please let Arrie Eastern know and provide orders to [facility].  Number provided is Psychologist, prison and probation services

## 2020-07-10 DIAGNOSIS — N184 Chronic kidney disease, stage 4 (severe): Secondary | ICD-10-CM | POA: Diagnosis not present

## 2020-07-10 DIAGNOSIS — I5030 Unspecified diastolic (congestive) heart failure: Secondary | ICD-10-CM | POA: Diagnosis not present

## 2020-07-10 DIAGNOSIS — J9600 Acute respiratory failure, unspecified whether with hypoxia or hypercapnia: Secondary | ICD-10-CM | POA: Diagnosis not present

## 2020-07-10 DIAGNOSIS — I130 Hypertensive heart and chronic kidney disease with heart failure and stage 1 through stage 4 chronic kidney disease, or unspecified chronic kidney disease: Secondary | ICD-10-CM | POA: Diagnosis not present

## 2020-07-10 DIAGNOSIS — I083 Combined rheumatic disorders of mitral, aortic and tricuspid valves: Secondary | ICD-10-CM | POA: Diagnosis not present

## 2020-07-10 DIAGNOSIS — E1122 Type 2 diabetes mellitus with diabetic chronic kidney disease: Secondary | ICD-10-CM | POA: Diagnosis not present

## 2020-07-11 DIAGNOSIS — J9600 Acute respiratory failure, unspecified whether with hypoxia or hypercapnia: Secondary | ICD-10-CM | POA: Diagnosis not present

## 2020-07-11 DIAGNOSIS — I5030 Unspecified diastolic (congestive) heart failure: Secondary | ICD-10-CM | POA: Diagnosis not present

## 2020-07-11 DIAGNOSIS — E1122 Type 2 diabetes mellitus with diabetic chronic kidney disease: Secondary | ICD-10-CM | POA: Diagnosis not present

## 2020-07-11 DIAGNOSIS — I130 Hypertensive heart and chronic kidney disease with heart failure and stage 1 through stage 4 chronic kidney disease, or unspecified chronic kidney disease: Secondary | ICD-10-CM | POA: Diagnosis not present

## 2020-07-11 DIAGNOSIS — N184 Chronic kidney disease, stage 4 (severe): Secondary | ICD-10-CM | POA: Diagnosis not present

## 2020-07-11 DIAGNOSIS — I083 Combined rheumatic disorders of mitral, aortic and tricuspid valves: Secondary | ICD-10-CM | POA: Diagnosis not present

## 2020-07-13 DIAGNOSIS — E1122 Type 2 diabetes mellitus with diabetic chronic kidney disease: Secondary | ICD-10-CM | POA: Diagnosis not present

## 2020-07-13 DIAGNOSIS — N184 Chronic kidney disease, stage 4 (severe): Secondary | ICD-10-CM | POA: Diagnosis not present

## 2020-07-13 DIAGNOSIS — I5030 Unspecified diastolic (congestive) heart failure: Secondary | ICD-10-CM | POA: Diagnosis not present

## 2020-07-13 DIAGNOSIS — I083 Combined rheumatic disorders of mitral, aortic and tricuspid valves: Secondary | ICD-10-CM | POA: Diagnosis not present

## 2020-07-13 DIAGNOSIS — I130 Hypertensive heart and chronic kidney disease with heart failure and stage 1 through stage 4 chronic kidney disease, or unspecified chronic kidney disease: Secondary | ICD-10-CM | POA: Diagnosis not present

## 2020-07-13 DIAGNOSIS — J9600 Acute respiratory failure, unspecified whether with hypoxia or hypercapnia: Secondary | ICD-10-CM | POA: Diagnosis not present

## 2020-07-14 DIAGNOSIS — E1122 Type 2 diabetes mellitus with diabetic chronic kidney disease: Secondary | ICD-10-CM | POA: Diagnosis not present

## 2020-07-14 DIAGNOSIS — J9600 Acute respiratory failure, unspecified whether with hypoxia or hypercapnia: Secondary | ICD-10-CM | POA: Diagnosis not present

## 2020-07-14 DIAGNOSIS — N184 Chronic kidney disease, stage 4 (severe): Secondary | ICD-10-CM | POA: Diagnosis not present

## 2020-07-14 DIAGNOSIS — I083 Combined rheumatic disorders of mitral, aortic and tricuspid valves: Secondary | ICD-10-CM | POA: Diagnosis not present

## 2020-07-14 DIAGNOSIS — I5030 Unspecified diastolic (congestive) heart failure: Secondary | ICD-10-CM | POA: Diagnosis not present

## 2020-07-14 DIAGNOSIS — I130 Hypertensive heart and chronic kidney disease with heart failure and stage 1 through stage 4 chronic kidney disease, or unspecified chronic kidney disease: Secondary | ICD-10-CM | POA: Diagnosis not present

## 2020-07-15 ENCOUNTER — Ambulatory Visit: Payer: Medicare Other | Admitting: *Deleted

## 2020-07-15 DIAGNOSIS — I5030 Unspecified diastolic (congestive) heart failure: Secondary | ICD-10-CM | POA: Diagnosis not present

## 2020-07-15 DIAGNOSIS — I083 Combined rheumatic disorders of mitral, aortic and tricuspid valves: Secondary | ICD-10-CM | POA: Diagnosis not present

## 2020-07-15 DIAGNOSIS — I130 Hypertensive heart and chronic kidney disease with heart failure and stage 1 through stage 4 chronic kidney disease, or unspecified chronic kidney disease: Secondary | ICD-10-CM | POA: Diagnosis not present

## 2020-07-15 DIAGNOSIS — E1122 Type 2 diabetes mellitus with diabetic chronic kidney disease: Secondary | ICD-10-CM | POA: Diagnosis not present

## 2020-07-15 DIAGNOSIS — J9600 Acute respiratory failure, unspecified whether with hypoxia or hypercapnia: Secondary | ICD-10-CM | POA: Diagnosis not present

## 2020-07-15 DIAGNOSIS — N184 Chronic kidney disease, stage 4 (severe): Secondary | ICD-10-CM | POA: Diagnosis not present

## 2020-07-16 DIAGNOSIS — I083 Combined rheumatic disorders of mitral, aortic and tricuspid valves: Secondary | ICD-10-CM | POA: Diagnosis not present

## 2020-07-16 DIAGNOSIS — I5030 Unspecified diastolic (congestive) heart failure: Secondary | ICD-10-CM | POA: Diagnosis not present

## 2020-07-16 DIAGNOSIS — N184 Chronic kidney disease, stage 4 (severe): Secondary | ICD-10-CM | POA: Diagnosis not present

## 2020-07-16 DIAGNOSIS — J9600 Acute respiratory failure, unspecified whether with hypoxia or hypercapnia: Secondary | ICD-10-CM | POA: Diagnosis not present

## 2020-07-16 DIAGNOSIS — I130 Hypertensive heart and chronic kidney disease with heart failure and stage 1 through stage 4 chronic kidney disease, or unspecified chronic kidney disease: Secondary | ICD-10-CM | POA: Diagnosis not present

## 2020-07-16 DIAGNOSIS — E1122 Type 2 diabetes mellitus with diabetic chronic kidney disease: Secondary | ICD-10-CM | POA: Diagnosis not present

## 2020-07-19 DEATH — deceased

## 2020-07-23 ENCOUNTER — Ambulatory Visit: Payer: Medicare Other | Admitting: Internal Medicine

## 2020-07-26 ENCOUNTER — Other Ambulatory Visit: Payer: Self-pay | Admitting: General Practice

## 2020-07-29 ENCOUNTER — Inpatient Hospital Stay: Payer: Medicare Other | Attending: Internal Medicine

## 2020-07-30 ENCOUNTER — Ambulatory Visit: Payer: Medicare Other | Admitting: Registered"

## 2020-07-31 ENCOUNTER — Inpatient Hospital Stay: Payer: Medicare Other | Admitting: Internal Medicine

## 2020-07-31 NOTE — Telephone Encounter (Signed)
Called and spoke w/nurse [place] who stated the pt is deceased and passed on 19-Jul-2020 at 9:25

## 2020-08-06 ENCOUNTER — Telehealth: Payer: Self-pay | Admitting: Internal Medicine

## 2020-08-06 NOTE — Telephone Encounter (Signed)
Sorry.  I took care of it.  Thanks

## 2020-08-06 NOTE — Telephone Encounter (Signed)
Death certificate was dropped off on 1.19.2022 and put in Dr. Judeen Hammans in basket.   Best contact #: [phone]

## 2020-08-06 NOTE — Telephone Encounter (Signed)
MD signed death certificate. HIM contacted to mark chart deceased and funeral home called for pick up.

## 2020-08-06 NOTE — Telephone Encounter (Signed)
Put on [employer].Marland Kitchen/LMB

## 2020-08-06 NOTE — Telephone Encounter (Signed)
Place on MD desk.Marland KitchenJohny [name]
